# Patient Record
Sex: Male | Born: 1960 | Race: White | Hispanic: No | Smoking: Former smoker
Health system: Southern US, Community
[De-identification: ages and names within clinical notes are randomized; demographics above are authoritative.]

## PROBLEM LIST (undated history)

## (undated) DIAGNOSIS — Z7901 Long term (current) use of anticoagulants: Secondary | ICD-10-CM

## (undated) DIAGNOSIS — M869 Osteomyelitis, unspecified: Secondary | ICD-10-CM

## (undated) DIAGNOSIS — G8929 Other chronic pain: Secondary | ICD-10-CM

## (undated) DIAGNOSIS — E119 Type 2 diabetes mellitus without complications: Secondary | ICD-10-CM

## (undated) DIAGNOSIS — G8928 Other chronic postprocedural pain: Secondary | ICD-10-CM

## (undated) DIAGNOSIS — I10 Essential (primary) hypertension: Secondary | ICD-10-CM

## (undated) DIAGNOSIS — E559 Vitamin D deficiency, unspecified: Secondary | ICD-10-CM

## (undated) DIAGNOSIS — R05 Cough: Secondary | ICD-10-CM

## (undated) DIAGNOSIS — E1140 Type 2 diabetes mellitus with diabetic neuropathy, unspecified: Secondary | ICD-10-CM

## (undated) DIAGNOSIS — K648 Other hemorrhoids: Secondary | ICD-10-CM

## (undated) DIAGNOSIS — C801 Malignant (primary) neoplasm, unspecified: Secondary | ICD-10-CM

## (undated) DIAGNOSIS — I509 Heart failure, unspecified: Secondary | ICD-10-CM

## (undated) DIAGNOSIS — D649 Anemia, unspecified: Secondary | ICD-10-CM

## (undated) DIAGNOSIS — M1990 Unspecified osteoarthritis, unspecified site: Secondary | ICD-10-CM

## (undated) DIAGNOSIS — M545 Low back pain, unspecified: Secondary | ICD-10-CM

## (undated) DIAGNOSIS — I219 Acute myocardial infarction, unspecified: Secondary | ICD-10-CM

## (undated) DIAGNOSIS — K219 Gastro-esophageal reflux disease without esophagitis: Secondary | ICD-10-CM

## (undated) DIAGNOSIS — Z9289 Personal history of other medical treatment: Secondary | ICD-10-CM

## (undated) DIAGNOSIS — G609 Hereditary and idiopathic neuropathy, unspecified: Secondary | ICD-10-CM

## (undated) DIAGNOSIS — L97509 Non-pressure chronic ulcer of other part of unspecified foot with unspecified severity: Secondary | ICD-10-CM

## (undated) DIAGNOSIS — I951 Orthostatic hypotension: Secondary | ICD-10-CM

## (undated) DIAGNOSIS — E7800 Pure hypercholesterolemia, unspecified: Secondary | ICD-10-CM

## (undated) DIAGNOSIS — E11621 Type 2 diabetes mellitus with foot ulcer: Secondary | ICD-10-CM

## (undated) DIAGNOSIS — G4730 Sleep apnea, unspecified: Secondary | ICD-10-CM

## (undated) DIAGNOSIS — Z992 Dependence on renal dialysis: Secondary | ICD-10-CM

## (undated) DIAGNOSIS — J189 Pneumonia, unspecified organism: Secondary | ICD-10-CM

## (undated) DIAGNOSIS — M353 Polymyalgia rheumatica: Secondary | ICD-10-CM

## (undated) DIAGNOSIS — R059 Cough, unspecified: Secondary | ICD-10-CM

## (undated) DIAGNOSIS — N289 Disorder of kidney and ureter, unspecified: Secondary | ICD-10-CM

## (undated) DIAGNOSIS — M2550 Pain in unspecified joint: Secondary | ICD-10-CM

## (undated) DIAGNOSIS — N186 End stage renal disease: Secondary | ICD-10-CM

## (undated) DIAGNOSIS — M86172 Other acute osteomyelitis, left ankle and foot: Secondary | ICD-10-CM

## (undated) DIAGNOSIS — G4700 Insomnia, unspecified: Secondary | ICD-10-CM

## (undated) DIAGNOSIS — I2510 Atherosclerotic heart disease of native coronary artery without angina pectoris: Secondary | ICD-10-CM

## (undated) DIAGNOSIS — F419 Anxiety disorder, unspecified: Secondary | ICD-10-CM

## (undated) DIAGNOSIS — I639 Cerebral infarction, unspecified: Secondary | ICD-10-CM

## (undated) DIAGNOSIS — I6381 Other cerebral infarction due to occlusion or stenosis of small artery: Secondary | ICD-10-CM

## (undated) HISTORY — PX: UVULOPALATOPHARYNGOPLASTY, TONSILLECTOMY AND SEPTOPLASTY: SHX2632

## (undated) HISTORY — PX: BACK SURGERY: SHX140

## (undated) HISTORY — PX: TONSILLECTOMY: SUR1361

## (undated) HISTORY — DX: Other cerebral infarction due to occlusion or stenosis of small artery: I63.81

## (undated) HISTORY — DX: Long term (current) use of anticoagulants: Z79.01

## (undated) HISTORY — DX: Osteomyelitis, unspecified: M86.9

## (undated) HISTORY — DX: Hereditary and idiopathic neuropathy, unspecified: G60.9

## (undated) HISTORY — DX: Other hemorrhoids: K64.8

## (undated) HISTORY — DX: Anxiety disorder, unspecified: F41.9

## (undated) HISTORY — PX: JOINT REPLACEMENT: SHX530

## (undated) HISTORY — DX: Pain in unspecified joint: M25.50

## (undated) HISTORY — DX: Vitamin D deficiency, unspecified: E55.9

## (undated) HISTORY — DX: Other chronic postprocedural pain: G89.28

## (undated) HISTORY — PX: TOE AMPUTATION: SHX809

## (undated) HISTORY — DX: Anemia, unspecified: D64.9

## (undated) HISTORY — PX: REFRACTIVE SURGERY: SHX103

## (undated) HISTORY — PX: CARDIAC CATHETERIZATION: SHX172

## (undated) HISTORY — DX: Heart failure, unspecified: I50.9

## (undated) HISTORY — PX: CORONARY ARTERY BYPASS GRAFT: SHX141

## (undated) HISTORY — DX: Insomnia, unspecified: G47.00

---

## 1993-01-11 DIAGNOSIS — I219 Acute myocardial infarction, unspecified: Secondary | ICD-10-CM

## 1993-01-11 HISTORY — DX: Acute myocardial infarction, unspecified: I21.9

## 2004-01-12 DIAGNOSIS — I6381 Other cerebral infarction due to occlusion or stenosis of small artery: Secondary | ICD-10-CM

## 2004-01-12 HISTORY — DX: Other cerebral infarction due to occlusion or stenosis of small artery: I63.81

## 2006-01-10 DIAGNOSIS — I639 Cerebral infarction, unspecified: Secondary | ICD-10-CM

## 2006-01-10 HISTORY — DX: Cerebral infarction, unspecified: I63.9

## 2008-01-12 DIAGNOSIS — M2550 Pain in unspecified joint: Secondary | ICD-10-CM

## 2008-01-12 HISTORY — DX: Pain in unspecified joint: M25.50

## 2009-02-17 DIAGNOSIS — M47812 Spondylosis without myelopathy or radiculopathy, cervical region: Secondary | ICD-10-CM | POA: Insufficient documentation

## 2009-07-25 DIAGNOSIS — I509 Heart failure, unspecified: Secondary | ICD-10-CM

## 2009-07-25 HISTORY — DX: Heart failure, unspecified: I50.9

## 2011-02-12 HISTORY — PX: ANTERIOR CERVICAL DECOMP/DISCECTOMY FUSION: SHX1161

## 2011-09-22 ENCOUNTER — Emergency Department (HOSPITAL_COMMUNITY)
Admission: EM | Admit: 2011-09-22 | Discharge: 2011-09-22 | Disposition: A | Discharge status: Home / Self Care | Payer: No Typology Code available for payment source | Attending: Emergency Medicine | Admitting: Emergency Medicine

## 2011-09-22 ENCOUNTER — Emergency Department (HOSPITAL_COMMUNITY): Payer: No Typology Code available for payment source

## 2011-09-22 ENCOUNTER — Encounter (HOSPITAL_COMMUNITY): Payer: Self-pay | Admitting: Emergency Medicine

## 2011-09-22 DIAGNOSIS — S335XXA Sprain of ligaments of lumbar spine, initial encounter: Secondary | ICD-10-CM | POA: Insufficient documentation

## 2011-09-22 DIAGNOSIS — M542 Cervicalgia: Secondary | ICD-10-CM | POA: Insufficient documentation

## 2011-09-22 DIAGNOSIS — Z794 Long term (current) use of insulin: Secondary | ICD-10-CM | POA: Insufficient documentation

## 2011-09-22 DIAGNOSIS — E119 Type 2 diabetes mellitus without complications: Secondary | ICD-10-CM | POA: Insufficient documentation

## 2011-09-22 DIAGNOSIS — S139XXA Sprain of joints and ligaments of unspecified parts of neck, initial encounter: Secondary | ICD-10-CM | POA: Insufficient documentation

## 2011-09-22 DIAGNOSIS — Z79899 Other long term (current) drug therapy: Secondary | ICD-10-CM | POA: Insufficient documentation

## 2011-09-22 DIAGNOSIS — I10 Essential (primary) hypertension: Secondary | ICD-10-CM | POA: Insufficient documentation

## 2011-09-22 DIAGNOSIS — S161XXA Strain of muscle, fascia and tendon at neck level, initial encounter: Secondary | ICD-10-CM

## 2011-09-22 DIAGNOSIS — M353 Polymyalgia rheumatica: Secondary | ICD-10-CM | POA: Insufficient documentation

## 2011-09-22 DIAGNOSIS — W010XXA Fall on same level from slipping, tripping and stumbling without subsequent striking against object, initial encounter: Secondary | ICD-10-CM | POA: Insufficient documentation

## 2011-09-22 DIAGNOSIS — S39012A Strain of muscle, fascia and tendon of lower back, initial encounter: Secondary | ICD-10-CM

## 2011-09-22 DIAGNOSIS — IMO0001 Reserved for inherently not codable concepts without codable children: Secondary | ICD-10-CM | POA: Insufficient documentation

## 2011-09-22 DIAGNOSIS — Y9229 Other specified public building as the place of occurrence of the external cause: Secondary | ICD-10-CM | POA: Insufficient documentation

## 2011-09-22 HISTORY — DX: Polymyalgia rheumatica: M35.3

## 2011-09-22 HISTORY — DX: Essential (primary) hypertension: I10

## 2011-09-22 MED ORDER — KETOROLAC TROMETHAMINE 60 MG/2ML IM SOLN
60.0000 mg | Freq: Once | INTRAMUSCULAR | Status: AC
  Administered 2011-09-22: 60 mg via INTRAMUSCULAR
  Filled 2011-09-22: qty 2

## 2011-09-22 MED ORDER — IBUPROFEN 600 MG PO TABS: 600.0000 mg | ORAL_TABLET | Freq: Four times a day (QID) | ORAL | Status: AC | PRN

## 2011-09-22 MED ORDER — METHOCARBAMOL 500 MG PO TABS
1000.0000 mg | ORAL_TABLET | Freq: Once | ORAL | Status: AC
  Administered 2011-09-22: 1000 mg via ORAL
  Filled 2011-09-22: qty 2

## 2011-09-22 MED ORDER — HYDROCODONE-ACETAMINOPHEN 5-325 MG PO TABS
1.0000 | ORAL_TABLET | Freq: Once | ORAL | Status: AC
  Administered 2011-09-22: 1 via ORAL
  Filled 2011-09-22: qty 1

## 2011-09-22 MED ORDER — METHOCARBAMOL 500 MG PO TABS: 500.0000 mg | ORAL_TABLET | Freq: Four times a day (QID) | ORAL | Status: AC

## 2011-09-22 MED ORDER — OXYCODONE-ACETAMINOPHEN 5-325 MG PO TABS: 1.0000 | ORAL_TABLET | ORAL | Status: AC | PRN

## 2011-09-22 NOTE — ED Notes (Signed)
[id number] Expected date:<BR> Expected time:<BR> Means of arrival:<BR> Comments:<BR> Fall/hip,back pain/lsb

## 2011-09-22 NOTE — ED Provider Notes (Signed)
History     CSN: [id number]  Arrival date & time 09/22/11  1141   First MD Initiated Contact with Patient 09/22/11 1253      Chief Complaint  Patient presents with  . Fall  . Spasms    (Consider location/radiation/quality/duration/timing/severity/associated sxs/prior treatment) HPI Pt had slip in grocery store. States he caught himself and did not strike head, neck or back. Now he c/o posterior cervical tenderness. Pt has had fusion in the past. Also c/o low back pain and R buttock pain. No focal weakness. C/o ongoing tingling in ext.  Past Medical History  Diagnosis Date  . Polymyalgia rheumatica   . Diabetes mellitus   . Hypertension     Past Surgical History  Procedure Date  . Neck fusion     No family history on file.  History  Substance Use Topics  . Smoking status: Not on file  . Smokeless tobacco: Not on file  . Alcohol Use:       Review of Systems  Constitutional: Negative for fever and chills.  HENT: Positive for neck pain. Negative for neck stiffness.   Eyes: Negative for visual disturbance.  Cardiovascular: Negative for chest pain.  Gastrointestinal: Negative for nausea, vomiting, abdominal pain and diarrhea.  Genitourinary: Negative for dysuria, frequency, flank pain and enuresis.  Musculoskeletal: Positive for myalgias and back pain.  Skin: Negative for rash and wound.  Neurological: Negative for dizziness, seizures, syncope, weakness, light-headedness, numbness and headaches.    Allergies  Tygacil  Home Medications   Current Outpatient Rx  Name Route Sig Dispense Refill  . ALPRAZOLAM 0.5 MG PO TABS Oral Take 0.5 mg by mouth 3 (three) times daily as needed. Anxiety / Sleep    . AMITRIPTYLINE HCL 50 MG PO TABS Oral Take 50 mg by mouth at bedtime.    Marland Kitchen AMLODIPINE BESYLATE 2.5 MG PO TABS Oral Take 2.5 mg by mouth daily.    . ATENOLOL 25 MG PO TABS Oral Take 25 mg by mouth daily.    . FUROSEMIDE 20 MG PO TABS Oral Take 20 mg by mouth 2 (two)  times daily.    Marland Kitchen GABAPENTIN 100 MG PO CAPS Oral Take 400 mg by mouth 3 (three) times daily. Take four 100 mg capsules (400 mg total) three times daily    . GLIPIZIDE 10 MG PO TABS Oral Take 10 mg by mouth 2 (two) times daily before a meal.    . INSULIN GLARGINE 100 UNIT/ML [place] SOLN Subcutaneous Inject 32 Units into the skin daily with breakfast.    . LOSARTAN POTASSIUM 25 MG PO TABS Oral Take 25 mg by mouth daily.    . ADULT MULTIVITAMIN W/MINERALS CH Oral Take 1 tablet by mouth daily.    Marland Kitchen PREDNISONE 5 MG PO TABS Oral Take 5 mg by mouth daily.    . IBUPROFEN 600 MG PO TABS Oral Take 1 tablet (600 mg total) by mouth every 6 (six) hours as needed for pain. 30 tablet 0  . METHOCARBAMOL 500 MG PO TABS Oral Take 1 tablet (500 mg total) by mouth 4 (four) times daily. 20 tablet 0  . OXYCODONE-ACETAMINOPHEN 5-325 MG PO TABS Oral Take 1 tablet by mouth every 4 (four) hours as needed for pain. 15 tablet 0    BP 158/106  Pulse 99  Temp 98.3 F (36.8 C) (Oral)  Resp 20  SpO2 100%  Physical Exam  Nursing note and vitals reviewed. Constitutional: He is oriented to person, place, and time. He  appears well-developed and well-nourished. No distress.  HENT:  Head: Normocephalic and atraumatic.  Mouth/Throat: Oropharynx is clear and moist.  Eyes: EOM are normal. Pupils are equal, round, and reactive to light.  Neck:       Pt has generalized posterior cervical TTP without focality  Cardiovascular: Normal rate and regular rhythm.   Pulmonary/Chest: Effort normal and breath sounds normal. No respiratory distress. He has no wheezes. He has no rales.  Abdominal: Soft. Bowel sounds are normal. He exhibits no mass. There is no tenderness. There is no rebound and no guarding.  Musculoskeletal: Normal range of motion. He exhibits tenderness (TTP over mid line lumbar and R paraspinal tenderness, TTP over R gluteal muscles. FROM R hip). He exhibits no edema.  Neurological: He is alert and oriented to person,  place, and time.       5/5 motor in all extremities, sensation grossly intact.   Skin: Skin is warm and dry. No rash noted. No erythema.  Psychiatric: He has a normal mood and affect. His behavior is normal.    ED Course  Procedures (including critical care time)  Labs Reviewed - No data to display Dg Lumbar Spine Complete  09/22/2011  *RADIOLOGY REPORT*  Clinical Data: History of fall complaining of back pain.  LUMBAR SPINE - COMPLETE 4+ VIEW  Comparison: No priors.  Findings: Multiple views of the lumbar spine demonstrate no definite acute displaced fracture or compression type fracture. There is mild multilevel degenerative disc disease, most severe at T10-T11 and T11-T12.  Mild multilevel facet arthropathy is also noted, most severe L4 - L5 and L5 - S1.  No pars defects are noted.  IMPRESSION: 1.  No acute radiographic abnormality of the lumbar spine. 2.  Mild multilevel degenerative disc disease and lumbar spondylosis, as above.   Original Report Authenticated By: Etheleen Mayhew, M.D.    Dg Hip Complete Right  09/22/2011  *RADIOLOGY REPORT*  Clinical Data: History of fall complaining of right-sided hip pain.  RIGHT HIP - COMPLETE 2+ VIEW  Comparison: No priors.  Findings: AP view of the pelvis and AP and lateral views of the right hip demonstrate no acute displaced fracture, subluxation, dislocation, joint or soft tissue abnormality.  Degenerative changes of osteoarthritis are noted the hip joints bilaterally (mild). Multiple pelvic phleboliths are noted bilaterally.  IMPRESSION: 1.  No acute radiographic abnormality of the bony pelvis or the right hip. 2.  Mild bilateral hip joint osteoarthritis.   Original Report Authenticated By: Etheleen Mayhew, M.D.    Ct Cervical Spine Wo Contrast  09/22/2011  *RADIOLOGY REPORT*  Clinical Data: History of fall.  Posterior neck pain.  CT CERVICAL SPINE WITHOUT CONTRAST  Technique:  Multidetector CT imaging of the cervical spine was performed.  Multiplanar CT image reconstructions were also generated.  Comparison: MRI of the cervical spine 09/29/2010.  Findings: Postoperative changes of the ACDF are noted from C4-C7. At these levels, there is an anterior plate and screw fixation device in place, and there are interbody grafts in place at the C4- C5, C5-C6 and C6-C7 interspaces.  Alignment is anatomic.  No acute displaced fractures of the cervical spine are appreciated. Prevertebral soft tissues are normal.  Visualized portions of the upper thorax are unremarkable.  IMPRESSION: 1.  No evidence of significant acute traumatic injury to the cervical spine. 2.  Postoperative changes of ACDF from C4-C7, as above, without acute complicating features.   Original Report Authenticated By: Etheleen Mayhew, M.D.  1. Cervical strain   2. Lumbar strain       MDM   Pt ambulating without difficulty.        Julianne Rice, MD 09/22/11 [phone]

## 2011-09-22 NOTE — ED Notes (Signed)
Per EMS: Pt was at [employer] and was about to eat lunch with his fiance and slipped on something slippery on the floor.  Pt went to catch himself and twisted his back (says he heard a pop).  Knocked over an olive oil display.  Pt now c/o back, hip, neck, knee pain, back spasms, and numbness and tingling to rt arm.  Denies LOC or hitting head.  Hx polymyalgia rheumatic but numbness and tingling is much worse now than it normally is.

## 2011-09-22 NOTE — ED Notes (Signed)
Patient transported to CT 

## 2011-10-12 DIAGNOSIS — K648 Other hemorrhoids: Secondary | ICD-10-CM

## 2011-10-12 DIAGNOSIS — B3781 Candidal esophagitis: Secondary | ICD-10-CM | POA: Insufficient documentation

## 2011-10-12 HISTORY — DX: Other hemorrhoids: K64.8

## 2011-12-24 ENCOUNTER — Inpatient Hospital Stay
Admission: AD | Admit: 2011-12-24 | Discharge: 2012-01-27 | Disposition: A | Discharge status: Skilled Nursing Facility | Payer: Medicare Other | Source: Ambulatory Visit | Attending: Internal Medicine | Admitting: Internal Medicine

## 2011-12-24 DIAGNOSIS — M869 Osteomyelitis, unspecified: Secondary | ICD-10-CM

## 2011-12-24 HISTORY — DX: Gastro-esophageal reflux disease without esophagitis: K21.9

## 2011-12-24 HISTORY — DX: Other acute osteomyelitis, left ankle and foot: M86.172

## 2011-12-24 HISTORY — DX: Type 2 diabetes mellitus with diabetic neuropathy, unspecified: E11.40

## 2011-12-25 ENCOUNTER — Other Ambulatory Visit (HOSPITAL_COMMUNITY): Payer: Medicare Other

## 2011-12-25 LAB — CBC WITH DIFFERENTIAL/PLATELET
Eosinophils Relative: 3 % (ref 0–5)
HCT: 28.8 % — ABNORMAL LOW (ref 39.0–52.0)
Hemoglobin: 9.5 g/dL — ABNORMAL LOW (ref 13.0–17.0)
Lymphocytes Relative: 29 % (ref 12–46)
Lymphs Abs: 3.5 10*3/uL (ref 0.7–4.0)
MCV: 84.2 fL (ref 78.0–100.0)
Monocytes Absolute: 0.8 10*3/uL (ref 0.1–1.0)
Monocytes Relative: 7 % (ref 3–12)
Neutro Abs: 7.3 10*3/uL (ref 1.7–7.7)
WBC: 12 10*3/uL — ABNORMAL HIGH (ref 4.0–10.5)

## 2011-12-25 LAB — APTT: aPTT: 32 seconds (ref 24–37)

## 2011-12-25 LAB — COMPREHENSIVE METABOLIC PANEL
AST: 20 U/L (ref 0–37)
BUN: 42 mg/dL — ABNORMAL HIGH (ref 6–23)
CO2: 20 mEq/L (ref 19–32)
Calcium: 8.5 mg/dL (ref 8.4–10.5)
Chloride: 104 mEq/L (ref 96–112)
Creatinine, Ser: 1.98 mg/dL — ABNORMAL HIGH (ref 0.50–1.35)
GFR calc Af Amer: 43 mL/min — ABNORMAL LOW (ref >90)
GFR calc non Af Amer: 37 mL/min — ABNORMAL LOW (ref >90)
Glucose, Bld: 170 mg/dL — ABNORMAL HIGH (ref 70–99)
Total Bilirubin: 0.1 mg/dL — ABNORMAL LOW (ref 0.3–1.2)

## 2011-12-25 LAB — HEMOGLOBIN A1C: Hgb A1c MFr Bld: 9.6 % — ABNORMAL HIGH (ref <5.7)

## 2011-12-26 LAB — BASIC METABOLIC PANEL
Chloride: 110 mEq/L (ref 96–112)
GFR calc Af Amer: 45 mL/min — ABNORMAL LOW (ref >90)
GFR calc non Af Amer: 39 mL/min — ABNORMAL LOW (ref >90)
Glucose, Bld: 106 mg/dL — ABNORMAL HIGH (ref 70–99)
Potassium: 4.1 mEq/L (ref 3.5–5.1)
Sodium: 139 mEq/L (ref 135–145)

## 2011-12-28 DIAGNOSIS — M79609 Pain in unspecified limb: Secondary | ICD-10-CM

## 2011-12-28 DIAGNOSIS — M7989 Other specified soft tissue disorders: Secondary | ICD-10-CM

## 2011-12-28 NOTE — Progress Notes (Signed)
VASCULAR LAB PRELIMINARY  PRELIMINARY  PRELIMINARY  PRELIMINARY  Left lower extremity venous duplex completed.    Preliminary report:  Left:  No evidence of DVT, superficial thrombosis, or Baker's cyst.  Donnetta Gillin, RVT 12/28/2011, 2:27 PM

## 2011-12-30 LAB — CBC
Hemoglobin: 8.4 g/dL — ABNORMAL LOW (ref 13.0–17.0)
MCV: 84.8 fL (ref 78.0–100.0)
Platelets: 277 10*3/uL (ref 150–400)
RBC: 3.1 MIL/uL — ABNORMAL LOW (ref 4.22–5.81)
WBC: 9.5 10*3/uL (ref 4.0–10.5)

## 2011-12-30 LAB — BASIC METABOLIC PANEL
CO2: 22 mEq/L (ref 19–32)
Chloride: 106 mEq/L (ref 96–112)
Sodium: 135 mEq/L (ref 135–145)

## 2011-12-31 LAB — BASIC METABOLIC PANEL
CO2: 22 mEq/L (ref 19–32)
Glucose, Bld: 102 mg/dL — ABNORMAL HIGH (ref 70–99)
Potassium: 4.5 mEq/L (ref 3.5–5.1)
Sodium: 136 mEq/L (ref 135–145)

## 2012-01-01 LAB — BASIC METABOLIC PANEL
BUN: 58 mg/dL — ABNORMAL HIGH (ref 6–23)
CO2: 22 mEq/L (ref 19–32)
Chloride: 105 mEq/L (ref 96–112)
Glucose, Bld: 109 mg/dL — ABNORMAL HIGH (ref 70–99)
Potassium: 4.6 mEq/L (ref 3.5–5.1)

## 2012-01-02 LAB — CBC
Hemoglobin: 8.2 g/dL — ABNORMAL LOW (ref 13.0–17.0)
MCH: 27.4 pg (ref 26.0–34.0)
MCV: 86 fL (ref 78.0–100.0)
RBC: 2.99 MIL/uL — ABNORMAL LOW (ref 4.22–5.81)

## 2012-01-02 LAB — BASIC METABOLIC PANEL
CO2: 22 mEq/L (ref 19–32)
Calcium: 9 mg/dL (ref 8.4–10.5)
Creatinine, Ser: 1.94 mg/dL — ABNORMAL HIGH (ref 0.50–1.35)
Glucose, Bld: 98 mg/dL (ref 70–99)
Sodium: 134 mEq/L — ABNORMAL LOW (ref 135–145)

## 2012-01-03 LAB — URINALYSIS, ROUTINE W REFLEX MICROSCOPIC
Nitrite: NEGATIVE
Specific Gravity, Urine: 1.021 (ref 1.005–1.030)
Urobilinogen, UA: 0.2 mg/dL (ref 0.0–1.0)
pH: 6 (ref 5.0–8.0)

## 2012-01-03 LAB — URINE MICROSCOPIC-ADD ON

## 2012-01-04 LAB — BASIC METABOLIC PANEL
Calcium: 8.6 mg/dL (ref 8.4–10.5)
GFR calc Af Amer: 48 mL/min — ABNORMAL LOW (ref >90)
GFR calc non Af Amer: 41 mL/min — ABNORMAL LOW (ref >90)
Glucose, Bld: 88 mg/dL (ref 70–99)
Potassium: 4.5 mEq/L (ref 3.5–5.1)
Sodium: 138 mEq/L (ref 135–145)

## 2012-01-05 LAB — VANCOMYCIN, TROUGH: Vancomycin Tr: 18.2 ug/mL (ref 10.0–20.0)

## 2012-01-06 LAB — BASIC METABOLIC PANEL
Calcium: 8.8 mg/dL (ref 8.4–10.5)
GFR calc Af Amer: 42 mL/min — ABNORMAL LOW (ref >90)
GFR calc non Af Amer: 37 mL/min — ABNORMAL LOW (ref >90)
Glucose, Bld: 71 mg/dL (ref 70–99)
Sodium: 134 mEq/L — ABNORMAL LOW (ref 135–145)

## 2012-01-06 LAB — URINE CULTURE: Special Requests: NORMAL

## 2012-01-07 LAB — BASIC METABOLIC PANEL
BUN: 68 mg/dL — ABNORMAL HIGH (ref 6–23)
CO2: 21 mEq/L (ref 19–32)
Calcium: 8.7 mg/dL (ref 8.4–10.5)
Chloride: 107 mEq/L (ref 96–112)
Creatinine, Ser: 1.82 mg/dL — ABNORMAL HIGH (ref 0.50–1.35)
GFR calc Af Amer: 48 mL/min — ABNORMAL LOW (ref >90)
GFR calc non Af Amer: 41 mL/min — ABNORMAL LOW (ref >90)
Glucose, Bld: 101 mg/dL — ABNORMAL HIGH (ref 70–99)
Potassium: 4.7 mEq/L (ref 3.5–5.1)
Sodium: 136 mEq/L (ref 135–145)

## 2012-01-07 LAB — CBC
MCH: 28 pg (ref 26.0–34.0)
Platelets: 253 10*3/uL (ref 150–400)
RBC: 2.71 MIL/uL — ABNORMAL LOW (ref 4.22–5.81)

## 2012-01-07 LAB — C-REACTIVE PROTEIN: CRP: 1.5 mg/dL — ABNORMAL HIGH (ref <0.60)

## 2012-01-07 LAB — PREPARE RBC (CROSSMATCH)

## 2012-01-07 LAB — SEDIMENTATION RATE: Sed Rate: 136 mm/hr — ABNORMAL HIGH (ref 0–16)

## 2012-01-08 LAB — ABO/RH: ABO/RH(D): O NEG

## 2012-01-09 LAB — CBC WITH DIFFERENTIAL/PLATELET
Eosinophils Absolute: 0.4 10*3/uL (ref 0.0–0.7)
Lymphocytes Relative: 42 % (ref 12–46)
Lymphs Abs: 3 10*3/uL (ref 0.7–4.0)
Neutro Abs: 2.9 10*3/uL (ref 1.7–7.7)
Neutrophils Relative %: 41 % — ABNORMAL LOW (ref 43–77)
Platelets: 246 10*3/uL (ref 150–400)
RBC: 3.04 MIL/uL — ABNORMAL LOW (ref 4.22–5.81)
WBC: 7.2 10*3/uL (ref 4.0–10.5)

## 2012-01-09 LAB — TYPE AND SCREEN: ABO/RH(D): O NEG

## 2012-01-10 LAB — BASIC METABOLIC PANEL
CO2: 23 mEq/L (ref 19–32)
Calcium: 9.3 mg/dL (ref 8.4–10.5)
GFR calc Af Amer: 48 mL/min — ABNORMAL LOW (ref >90)
Glucose, Bld: 93 mg/dL (ref 70–99)
Potassium: 5.6 mEq/L — ABNORMAL HIGH (ref 3.5–5.1)
Sodium: 133 mEq/L — ABNORMAL LOW (ref 135–145)

## 2012-01-10 LAB — C-REACTIVE PROTEIN: CRP: 1.4 mg/dL — ABNORMAL HIGH (ref <0.60)

## 2012-01-11 DIAGNOSIS — M7989 Other specified soft tissue disorders: Secondary | ICD-10-CM

## 2012-01-11 LAB — BASIC METABOLIC PANEL
BUN: 55 mg/dL — ABNORMAL HIGH (ref 6–23)
CO2: 22 mEq/L (ref 19–32)
Calcium: 8.9 mg/dL (ref 8.4–10.5)
GFR calc non Af Amer: 42 mL/min — ABNORMAL LOW (ref >90)
Glucose, Bld: 143 mg/dL — ABNORMAL HIGH (ref 70–99)
Potassium: 5.1 mEq/L (ref 3.5–5.1)
Sodium: 134 mEq/L — ABNORMAL LOW (ref 135–145)

## 2012-01-11 NOTE — Progress Notes (Signed)
*  PRELIMINARY RESULTS* Vascular Ultrasound Left lower extremity venous duplex has been completed.  Preliminary findings: Left:  No evidence of DVT, superficial thrombosis, or Baker's cyst.   Landry Mellow, RDMS, RVT 01/11/2012, 3:38 PM

## 2012-01-13 LAB — CBC
MCH: 27.7 pg (ref 26.0–34.0)
MCV: 87.2 fL (ref 78.0–100.0)
Platelets: 249 10*3/uL (ref 150–400)
RBC: 3.29 MIL/uL — ABNORMAL LOW (ref 4.22–5.81)
RDW: 14.3 % (ref 11.5–15.5)
WBC: 6.6 10*3/uL (ref 4.0–10.5)

## 2012-01-13 LAB — BASIC METABOLIC PANEL
CO2: 23 mEq/L (ref 19–32)
Calcium: 9 mg/dL (ref 8.4–10.5)
Creatinine, Ser: 2.18 mg/dL — ABNORMAL HIGH (ref 0.50–1.35)
GFR calc Af Amer: 38 mL/min — ABNORMAL LOW (ref >90)
Sodium: 133 mEq/L — ABNORMAL LOW (ref 135–145)

## 2012-01-14 ENCOUNTER — Encounter: Payer: Self-pay | Admitting: Infectious Diseases

## 2012-01-14 ENCOUNTER — Other Ambulatory Visit (HOSPITAL_COMMUNITY): Payer: Medicare Other

## 2012-01-14 DIAGNOSIS — R609 Edema, unspecified: Secondary | ICD-10-CM

## 2012-01-14 DIAGNOSIS — E119 Type 2 diabetes mellitus without complications: Secondary | ICD-10-CM

## 2012-01-14 LAB — BASIC METABOLIC PANEL
Calcium: 9.1 mg/dL (ref 8.4–10.5)
GFR calc Af Amer: 35 mL/min — ABNORMAL LOW (ref >90)
GFR calc non Af Amer: 30 mL/min — ABNORMAL LOW (ref >90)
Potassium: 4.9 mEq/L (ref 3.5–5.1)
Sodium: 135 mEq/L (ref 135–145)

## 2012-01-14 LAB — CK: Total CK: 158 U/L (ref 7–232)

## 2012-01-14 NOTE — Consult Note (Addendum)
INFECTIOUS DISEASE CONSULT NOTE  Date of Admission:  12/24/2011  Date of Consult:  01/14/2012  Reason for Consult:Osteomyelitis Referring Physician:   Impression/Recommendation Osteomyelitis of L foot   (vancomycin day 31) CRI, worsening Diabetes LE edema  Would- Check MRI of his L foot (without contrast) Follow his Cr, renal is evaluating Will change his vanco to daptomycin Consider dopplers of LE?  Comment- he is concerned that the infection has not cleared from his left foot. I concur with this as there is still erythema and some question of fluctuance at his wound site. An MRI without contrast (to preserve his renal function) would be the next test to order to evaluate this. Infectious disease will be available for questions over the weekend.   Thank you so much for this interesting consult,   [name] [id number]  Matthew Kline is an 52 y.o. male.  HPI: 52 year old male with a history of diabetes type 2 for the last 20 years as well as chronic kidney disease (creatinine of 1.98 on admission 12/24/2011). He underwent resection of his left 4th toe on November 1 for a diabetic foot ulcer. After being discharged at home he noticed a soft place on the wound bed. He began to notice purulent discharge from the wound. [facility] on December 10. He underwent the removal of bone as well as irrigation and debridement of his left foot on December 11. Pathology of the bone showed acute osteomyelitis. Culture of the material grew methicillin-resistant staph aureus sensitive to Bactrim and tetracycline. [facility] with a plan for him to be treated with vancomycin intravenously for the next 6 weeks (end date 02/01/2012. [facility] his  CRP was 1.9.  Today he complains of swelling and pain in his left lower extremity he also has swelling in his right lower extremity. The swelling  extends to his groin. He denies any discharge from his wound bed. Review of his hospital chart shows no fevers documented since his admission.  Past Medical History  Diagnosis Date  . Polymyalgia rheumatica   . Diabetes mellitus   . Hypertension     Past Surgical History  Procedure Date  . Neck fusion      Allergies  Allergen Reactions  . Tygacil (Tigecycline) Nausea And Vomiting    Medications: Amitriptyline, Norvasc, aspirin, fluconazole (12-27 to 01-13-12), fluticasone, Lasix, Neurontin, subcutaneous heparin, Lantus, Reglan, metoprolol, nicotine, OxyContin, Protonix, Carafate, vancomycin 750 mg intravenous every 24 hours, vitamin D.  Social History:  does not have a smoking history on file. He does not have any smokeless tobacco history on file. His alcohol and drug histories not on file.  No family history on file.  General ROS: normal vision, no difficulty with PIC, no dysuria, normal BM, eating well, no light touch LE. see HPI.   There were no vitals taken for this visit. 98.4    83    16/94%/RA     131/80     233.4# General appearance: alert, cooperative and no distress Eyes: negative findings: pupils equal, round, reactive to light and accomodation Throat: normal findings: oropharynx pink & moist without lesions or evidence of thrush and abnormal findings: poor dentitino Neck: no adenopathy and supple, symmetrical, trachea midline Lungs: clear to auscultation bilaterally Heart: regular rate and rhythm Abdomen: normal findings: bowel sounds normal and soft, non-tender Extremities: edema massive LE, tender, tender.  and .5 x 1 cm wound on his  L 4th MT head. erythema surrounding this onto his foot. no d/c expressable. fluctuance+.    Results for orders placed during the hospital encounter of 12/24/11 (from the past 48 hour(s))  VANCOMYCIN, TROUGH     Status: Abnormal   Collection Time   01/12/12  9:00 PM      Component Value Range Comment   Vancomycin Tr 23.9 (*) 10.0 -  20.0 ug/mL   CBC     Status: Abnormal   Collection Time   01/13/12  5:40 AM      Component Value Range Comment   WBC 6.6  4.0 - 10.5 K/uL    RBC 3.29 (*) 4.22 - 5.81 MIL/uL    Hemoglobin 9.1 (*) 13.0 - 17.0 g/dL    HCT 28.7 (*) 39.0 - 52.0 %    MCV 87.2  78.0 - 100.0 fL    MCH 27.7  26.0 - 34.0 pg    MCHC 31.7  30.0 - 36.0 g/dL    RDW 14.3  11.5 - 15.5 %    Platelets 249  150 - 400 K/uL   BASIC METABOLIC PANEL     Status: Abnormal   Collection Time   01/13/12  5:40 AM      Component Value Range Comment   Sodium 133 (*) 135 - 145 mEq/L    Potassium 5.3 (*) 3.5 - 5.1 mEq/L    Chloride 103  96 - 112 mEq/L    CO2 23  19 - 32 mEq/L    Glucose, Bld 146 (*) 70 - 99 mg/dL    BUN 59 (*) 6 - 23 mg/dL    Creatinine, Ser 2.18 (*) 0.50 - 1.35 mg/dL    Calcium 9.0  8.4 - 10.5 mg/dL    GFR calc non Af Amer 33 (*) >90 mL/min    GFR calc Af Amer 38 (*) >90 mL/min   BASIC METABOLIC PANEL     Status: Abnormal   Collection Time   01/14/12  5:30 AM      Component Value Range Comment   Sodium 135  135 - 145 mEq/L    Potassium 4.9  3.5 - 5.1 mEq/L    Chloride 104  96 - 112 mEq/L    CO2 23  19 - 32 mEq/L    Glucose, Bld 135 (*) 70 - 99 mg/dL    BUN 65 (*) 6 - 23 mg/dL    Creatinine, Ser 2.33 (*) 0.50 - 1.35 mg/dL    Calcium 9.1  8.4 - 10.5 mg/dL    GFR calc non Af Amer 30 (*) >90 mL/min    GFR calc Af Amer 35 (*) >90 mL/min   CK     Status: Normal   Collection Time   01/14/12  5:30 AM      Component Value Range Comment   Total CK 158  7 - 232 U/L       Component Value Date/Time   SDES URINE, RANDOM 01/03/2012 1335   SPECREQUEST Normal 01/03/2012 1335   CULT YEAST 01/03/2012 1335   REPTSTATUS 01/06/2012 FINAL 01/03/2012 1335   No results found. No results found for this or any previous visit (from the past 240 hour(s)).    01/14/2012, 4:10 PM     LOS: 21 days

## 2012-01-15 LAB — BASIC METABOLIC PANEL
CO2: 23 mEq/L (ref 19–32)
Chloride: 106 mEq/L (ref 96–112)
Creatinine, Ser: 2.31 mg/dL — ABNORMAL HIGH (ref 0.50–1.35)
GFR calc Af Amer: 36 mL/min — ABNORMAL LOW (ref >90)
Potassium: 5.3 mEq/L — ABNORMAL HIGH (ref 3.5–5.1)
Sodium: 135 mEq/L (ref 135–145)

## 2012-01-15 LAB — CBC
Platelets: 209 10*3/uL (ref 150–400)
RBC: 3.11 MIL/uL — ABNORMAL LOW (ref 4.22–5.81)
RDW: 14.3 % (ref 11.5–15.5)
WBC: 8.1 10*3/uL (ref 4.0–10.5)

## 2012-01-16 ENCOUNTER — Other Ambulatory Visit (HOSPITAL_COMMUNITY): Payer: Medicare Other

## 2012-01-16 LAB — VANCOMYCIN, TROUGH: Vancomycin Tr: 8.7 ug/mL — ABNORMAL LOW (ref 10.0–20.0)

## 2012-01-16 LAB — BASIC METABOLIC PANEL
CO2: 23 mEq/L (ref 19–32)
Calcium: 9.2 mg/dL (ref 8.4–10.5)
Potassium: 5.3 mEq/L — ABNORMAL HIGH (ref 3.5–5.1)
Sodium: 138 mEq/L (ref 135–145)

## 2012-01-17 LAB — BASIC METABOLIC PANEL
Chloride: 107 mEq/L (ref 96–112)
GFR calc Af Amer: 40 mL/min — ABNORMAL LOW (ref >90)
GFR calc non Af Amer: 35 mL/min — ABNORMAL LOW (ref >90)
Glucose, Bld: 145 mg/dL — ABNORMAL HIGH (ref 70–99)
Potassium: 5 mEq/L (ref 3.5–5.1)
Sodium: 137 mEq/L (ref 135–145)

## 2012-01-17 NOTE — Progress Notes (Signed)
INFECTIOUS DISEASE PROGRESS NOTE  ID: Matthew Kline is a 52 y.o. male with   Active Problems:  * No active hospital problems. *   Subjective: Without complaints. Has had some d/c from his wound.   Abtx:  Anti-infectives    None      Medications: Scheduled:    Objective: Vital signs in last 24 hours:     General appearance: alert, cooperative and no distress Extremities: L Kline 4th toe stump has no d/c expressable. non-tender. there is fluctuance.   Lab Results  Basename 01/17/12 0600 01/16/12 1300 01/15/12 0500  WBC -- -- 8.1  HGB -- -- 8.7*  HCT -- -- 26.9*  NA 137 138 --  K 5.0 5.3* --  CL 107 108 --  CO2 23 23 --  BUN 65* 66* --  CREATININE 2.10* 2.04* --  GLU -- -- --   Liver Panel No results found for this basename: PROT:2,ALBUMIN:2,AST:2,ALT:2,ALKPHOS:2,BILITOT:2,BILIDIR:2,IBILI:2 in the last 72 hours Sedimentation Rate No results found for this basename: ESRSEDRATE in the last 72 hours C-Reactive Protein No results found for this basename: CRP:2 in the last 72 hours  Microbiology: No results found for this or any previous visit (from the past 240 hour(s)).  Studies/Results: Matthew Kline Left Wo Contrast  01/16/2012  *RADIOLOGY REPORT*  Clinical Data: Nonhealing amputation site.  Osteomyelitis.  MRI OF THE LEFT FOREFOOT WITHOUT CONTRAST  Technique:  Multiplanar, multisequence Matthew imaging was performed. No intravenous contrast was administered.  Comparison: None.  Findings: Despite efforts by the patient and technologist, motion artifact is present on some series of today's examination and could not be totally eliminated.  This reduces diagnostic sensitivity and specificity.  Amputation of the fourth digit at the level of the distal metatarsal shaft observed.  There is abnormal edema tracking throughout the marrow of the fourth metatarsal, with ill definition of the cortex distally along the shaft, greater than would be expected simply in the setting of  amputation - the appearance is compatible with osteomyelitis.  Ill-defined low signal intensity tracking around the shaft of the fourth metatarsal probably represents immature bone deposition.  I do not observe definite gas tracking in the soft tissues.  There appears to be a small fluid collection along the distal margin of the amputation site on image 18 of series 3, probably tracking to the volar - anterior skin surface on image 17.  Remaining phalanges appear unremarkable.  Low-level edema in the middle and lateral cuneiforms and in the bases of the second and third metatarsals favors degenerative/reactive findings over active osteomyelitis.  Extensive regional soft tissue swelling noted with diffuse subcutaneous edema especially dorsally, and with diffuse edema tracking along the plantar musculature of the Kline.  IMPRESSION:  1.  Abnormal enhancement throughout abnormal enhancement throughout the remaining fourth metatarsal compatible with active osteomyelitis.  Small fluid collection along the distal amputation margin of the bone appears to probably drain to the dorsal Kline surface via a sinus tract. 2.  Very faint edema at the second and third digit Lisfranc joint, probably reactive rather than due to osteomyelitis. 3.  Diffuse regional soft tissue edema favoring cellulitis and myositis.   Original Report Authenticated By: Van Clines, M.D.      Assessment/Plan: Osteomyelitis of L Kline  (antibiotics day 34 vanco--> cubicin)  CRI, worsening (now steady at ~2) Diabetes  LE edema   Surgery/ortho to f/u pt.  His MRI result is noted.  Will check CK while on dapto    [name]  Infectious Diseases [id number] 01/17/2012, 5:03 PM   LOS: 24 days

## 2012-01-18 LAB — CBC
HCT: 28.9 % — ABNORMAL LOW (ref 39.0–52.0)
Hemoglobin: 9.2 g/dL — ABNORMAL LOW (ref 13.0–17.0)
MCHC: 31.8 g/dL (ref 30.0–36.0)

## 2012-01-18 LAB — PROTEIN ELECTROPHORESIS, SERUM
Albumin ELP: 36.1 % — ABNORMAL LOW (ref 55.8–66.1)
Alpha-1-Globulin: 5.6 % — ABNORMAL HIGH (ref 2.9–4.9)
Alpha-2-Globulin: 16.2 % — ABNORMAL HIGH (ref 7.1–11.8)
Beta Globulin: 7.6 % — ABNORMAL HIGH (ref 4.7–7.2)
M-Spike, %: NOT DETECTED g/dL
Total Protein ELP: 6.7 g/dL (ref 6.0–8.3)

## 2012-01-18 LAB — BASIC METABOLIC PANEL
BUN: 64 mg/dL — ABNORMAL HIGH (ref 6–23)
CO2: 24 mEq/L (ref 19–32)
GFR calc non Af Amer: 37 mL/min — ABNORMAL LOW (ref >90)
Glucose, Bld: 152 mg/dL — ABNORMAL HIGH (ref 70–99)
Potassium: 4.9 mEq/L (ref 3.5–5.1)

## 2012-01-18 LAB — CK: Total CK: 167 U/L (ref 7–232)

## 2012-01-20 MED ORDER — CEFAZOLIN SODIUM-DEXTROSE 2-3 GM-% IV SOLR
2.0000 g | INTRAVENOUS | Status: AC
  Administered 2012-01-21: 2 g via INTRAVENOUS

## 2012-01-20 NOTE — H&P (Signed)
Matthew Kline is an 52 y.o. male.   Chief Complaint: Osteomyelitis abscess left foot fourth metatarsal HPI: Patient is a 52 year old gentleman who is status post multiple surgical interventions for infection from both feet. Patient presents at this time with recurrent abscess and osteomyelitis of the left foot fourth metatarsal.  Past Medical History  Diagnosis Date  . Polymyalgia rheumatica   . Diabetes mellitus   . Hypertension   . Hyperlipidemia   . Diabetic neuropathy   . Osteomyelitis of foot, left, acute   . GERD (gastroesophageal reflux disease)     Past Surgical History  Procedure Date  . Neck fusion     Family History  Problem Relation Age of Onset  . Hypertension Mother    Social History:  reports that he quit smoking about 4 weeks ago. He does not have any smokeless tobacco history on file. He reports that he does not drink alcohol or use illicit drugs.  Allergies:  Allergies  Allergen Reactions  . Tygacil (Tigecycline) Nausea And Vomiting    Medications Prior to Admission  Medication Sig Dispense Refill  . ALPRAZolam (XANAX) 0.5 MG tablet Take 0.5 mg by mouth 3 (three) times daily as needed. Anxiety / Sleep      . amitriptyline (ELAVIL) 50 MG tablet Take 50 mg by mouth at bedtime.      Marland Kitchen amLODipine (NORVASC) 2.5 MG tablet Take 2.5 mg by mouth daily.      Marland Kitchen atenolol (TENORMIN) 25 MG tablet Take 25 mg by mouth daily.      . furosemide (LASIX) 20 MG tablet Take 20 mg by mouth 2 (two) times daily.      Marland Kitchen gabapentin (NEURONTIN) 100 MG capsule Take 400 mg by mouth 3 (three) times daily. Take four 100 mg capsules (400 mg total) three times daily      . glipiZIDE (GLUCOTROL) 10 MG tablet Take 10 mg by mouth 2 (two) times daily before a meal.      . insulin glargine (LANTUS) 100 UNIT/ML injection Inject 32 Units into the skin daily with breakfast.      . losartan (COZAAR) 25 MG tablet Take 25 mg by mouth daily.      . Multiple Vitamin (MULTIVITAMIN WITH MINERALS)  TABS Take 1 tablet by mouth daily.      . predniSONE (DELTASONE) 5 MG tablet Take 5 mg by mouth daily.        No results found for this or any previous visit (from the past 48 hour(s)). No results found.  Review of Systems  All other systems reviewed and are negative.    There were no vitals taken for this visit. Physical Exam  On examination patient does have palpable pulses. He has a fluctuant abscess dorsally over the fourth metatarsal. The MRI scan is consistent with abscess and osteomyelitis.  Assessment/Plan Assessment: Abscess osteomyelitis left foot fourth metatarsal.  Plan: Will plan for fourth ray resection left foot debridement of the abscess and placement of antibiotic beads. Risks and benefits were discussed including persistent infection neurovascular injury nonhealing of the wound need for additional surgery. Patient states he understands and wished to proceed at this time.  DUDA,MARCUS V 01/20/2012, 6:30 AM

## 2012-01-21 ENCOUNTER — Encounter
Admission: AD | Disposition: A | Discharge status: Skilled Nursing Facility | Payer: Self-pay | Source: Ambulatory Visit | Attending: Internal Medicine

## 2012-01-21 ENCOUNTER — Encounter (HOSPITAL_COMMUNITY): Payer: Self-pay | Admitting: Anesthesiology

## 2012-01-21 ENCOUNTER — Institutional Professional Consult (permissible substitution) (HOSPITAL_COMMUNITY): Payer: Medicare Other | Admitting: Anesthesiology

## 2012-01-21 DIAGNOSIS — M869 Osteomyelitis, unspecified: Secondary | ICD-10-CM

## 2012-01-21 HISTORY — PX: AMPUTATION: SHX166

## 2012-01-21 LAB — CBC WITH DIFFERENTIAL/PLATELET
HCT: 28.6 % — ABNORMAL LOW (ref 39.0–52.0)
Hemoglobin: 9.2 g/dL — ABNORMAL LOW (ref 13.0–17.0)
Lymphocytes Relative: 29 % (ref 12–46)
Lymphs Abs: 2.7 10*3/uL (ref 0.7–4.0)
MCHC: 32.2 g/dL (ref 30.0–36.0)
Monocytes Absolute: 1 10*3/uL (ref 0.1–1.0)
Monocytes Relative: 11 % (ref 3–12)
Neutro Abs: 5 10*3/uL (ref 1.7–7.7)
RBC: 3.39 MIL/uL — ABNORMAL LOW (ref 4.22–5.81)
WBC: 9.4 10*3/uL (ref 4.0–10.5)

## 2012-01-21 LAB — BASIC METABOLIC PANEL
BUN: 47 mg/dL — ABNORMAL HIGH (ref 6–23)
CO2: 25 mEq/L (ref 19–32)
Chloride: 106 mEq/L (ref 96–112)
Creatinine, Ser: 1.85 mg/dL — ABNORMAL HIGH (ref 0.50–1.35)
Glucose, Bld: 133 mg/dL — ABNORMAL HIGH (ref 70–99)

## 2012-01-21 LAB — GLUCOSE, CAPILLARY: Glucose-Capillary: 126 mg/dL — ABNORMAL HIGH (ref 70–99)

## 2012-01-21 LAB — PROTIME-INR: INR: 1.09 (ref 0.00–1.49)

## 2012-01-21 SURGERY — AMPUTATION, FOOT, RAY
Anesthesia: General | Site: Foot | Laterality: Left | Wound class: Dirty or Infected

## 2012-01-21 MED ORDER — FENTANYL CITRATE 0.05 MG/ML IJ SOLN
INTRAMUSCULAR | Status: DC | PRN
  Administered 2012-01-21 (×2): 25 ug via INTRAVENOUS

## 2012-01-21 MED ORDER — FENTANYL CITRATE 0.05 MG/ML IJ SOLN: 25.0000 ug | INTRAMUSCULAR | Status: DC | PRN

## 2012-01-21 MED ORDER — LACTATED RINGERS IV SOLN
INTRAVENOUS | Status: DC | PRN
  Administered 2012-01-21: 14:00:00 via INTRAVENOUS

## 2012-01-21 MED ORDER — SODIUM CHLORIDE 0.9 % IR SOLN
Status: DC | PRN
  Administered 2012-01-21: 1000 mL

## 2012-01-21 MED ORDER — GENTAMICIN SULFATE 40 MG/ML IJ SOLN
INTRAMUSCULAR | Status: DC | PRN
  Administered 2012-01-21: 160 mg

## 2012-01-21 MED ORDER — ONDANSETRON HCL 4 MG/2ML IJ SOLN: 4.0000 mg | Freq: Four times a day (QID) | INTRAMUSCULAR | Status: DC | PRN

## 2012-01-21 MED ORDER — VANCOMYCIN HCL 500 MG IV SOLR
INTRAVENOUS | Status: DC | PRN
  Administered 2012-01-21: 500 mg

## 2012-01-21 MED ORDER — PROPOFOL 10 MG/ML IV BOLUS
INTRAVENOUS | Status: DC | PRN
  Administered 2012-01-21: 180 mg via INTRAVENOUS

## 2012-01-21 MED ORDER — OXYCODONE HCL 5 MG PO TABS: 5.0000 mg | ORAL_TABLET | Freq: Once | ORAL | Status: AC | PRN

## 2012-01-21 MED ORDER — OXYCODONE HCL 5 MG/5ML PO SOLN: 5.0000 mg | Freq: Once | ORAL | Status: AC | PRN

## 2012-01-21 MED ORDER — LIDOCAINE HCL (CARDIAC) 20 MG/ML IV SOLN
INTRAVENOUS | Status: DC | PRN
  Administered 2012-01-21: 80 mg via INTRAVENOUS

## 2012-01-21 MED ORDER — MIDAZOLAM HCL 5 MG/5ML IJ SOLN
INTRAMUSCULAR | Status: DC | PRN
  Administered 2012-01-21: 2 mg via INTRAVENOUS

## 2012-01-21 SURGICAL SUPPLY — 40 items
BANDAGE ESMARK 6X9 LF (GAUZE/BANDAGES/DRESSINGS) IMPLANT
BANDAGE GAUZE ELAST BULKY 4 IN (GAUZE/BANDAGES/DRESSINGS) ×2 IMPLANT
BLADE SAW SGTL MED 73X18.5 STR (BLADE) IMPLANT
BNDG COHESIVE 4X5 TAN STRL (GAUZE/BANDAGES/DRESSINGS) ×2 IMPLANT
BNDG COHESIVE 6X5 TAN STRL LF (GAUZE/BANDAGES/DRESSINGS) ×2 IMPLANT
BNDG ESMARK 6X9 LF (GAUZE/BANDAGES/DRESSINGS)
CLOTH BEACON ORANGE TIMEOUT ST (SAFETY) ×2 IMPLANT
CUFF TOURNIQUET SINGLE 34IN LL (TOURNIQUET CUFF) IMPLANT
CUFF TOURNIQUET SINGLE 44IN (TOURNIQUET CUFF) IMPLANT
DRAPE U-SHAPE 47X51 STRL (DRAPES) ×4 IMPLANT
DRSG ADAPTIC 3X8 NADH LF (GAUZE/BANDAGES/DRESSINGS) ×2 IMPLANT
DURAPREP 26ML APPLICATOR (WOUND CARE) ×2 IMPLANT
ELECT REM PT RETURN 9FT ADLT (ELECTROSURGICAL) ×2
ELECTRODE REM PT RTRN 9FT ADLT (ELECTROSURGICAL) ×1 IMPLANT
GLOVE BIOGEL PI IND STRL 9 (GLOVE) ×1 IMPLANT
GLOVE BIOGEL PI INDICATOR 9 (GLOVE) ×1
GLOVE SURG ORTHO 9.0 STRL STRW (GLOVE) ×2 IMPLANT
GOWN PREVENTION PLUS XLARGE (GOWN DISPOSABLE) ×2 IMPLANT
GOWN SRG XL XLNG 56XLVL 4 (GOWN DISPOSABLE) ×1 IMPLANT
GOWN STRL NON-REIN XL XLG LVL4 (GOWN DISPOSABLE) ×1
KIT BASIN OR (CUSTOM PROCEDURE TRAY) ×2 IMPLANT
KIT ROOM TURNOVER OR (KITS) ×2 IMPLANT
KIT STIMULAN RAPID CURE 5CC (Orthopedic Implant) ×2 IMPLANT
MANIFOLD NEPTUNE II (INSTRUMENTS) ×2 IMPLANT
NS IRRIG 1000ML POUR BTL (IV SOLUTION) ×2 IMPLANT
PACK ORTHO EXTREMITY (CUSTOM PROCEDURE TRAY) ×2 IMPLANT
PAD ARMBOARD 7.5X6 YLW CONV (MISCELLANEOUS) ×4 IMPLANT
PAD CAST 4YDX4 CTTN HI CHSV (CAST SUPPLIES) ×1 IMPLANT
PADDING CAST COTTON 4X4 STRL (CAST SUPPLIES) ×1
SPONGE GAUZE 4X4 12PLY (GAUZE/BANDAGES/DRESSINGS) ×2 IMPLANT
SPONGE LAP 18X18 X RAY DECT (DISPOSABLE) ×2 IMPLANT
STAPLER VISISTAT 35W (STAPLE) ×2 IMPLANT
STOCKINETTE IMPERVIOUS LG (DRAPES) IMPLANT
SUCTION FRAZIER TIP 10 FR DISP (SUCTIONS) ×2 IMPLANT
SUT ETHILON 2 0 PSLX (SUTURE) ×4 IMPLANT
TOWEL OR 17X24 6PK STRL BLUE (TOWEL DISPOSABLE) ×2 IMPLANT
TOWEL OR 17X26 10 PK STRL BLUE (TOWEL DISPOSABLE) ×2 IMPLANT
TUBE CONNECTING 12X1/4 (SUCTIONS) ×2 IMPLANT
UNDERPAD 30X30 INCONTINENT (UNDERPADS AND DIAPERS) ×2 IMPLANT
WATER STERILE IRR 1000ML POUR (IV SOLUTION) ×2 IMPLANT

## 2012-01-21 NOTE — Op Note (Signed)
OPERATIVE REPORT  DATE OF SURGERY: 01/21/2012  PATIENT:  Matthew Kline,  52 y.o. male  PRE-OPERATIVE DIAGNOSIS:  Osteomyelitis, Abscess left Foot 4th MT  POST-OPERATIVE DIAGNOSIS:  Osteomyelitis, Abscess left Foot 4th MT  PROCEDURE:  Procedure(s): AMPUTATION RAY Fourth ray left foot. Application of antibiotic beads stimulant with 500 mg vancomycin and 160 mg gentamicin. Local tissue rearrangement for wound closure 6 cm x 1 cm  SURGEON:  Surgeon(s): [name], MD  ANESTHESIA:   general  EBL:  Minimal ML  SPECIMEN:  No Specimen  TOURNIQUET:  * No tourniquets in log *  PROCEDURE DETAILS: Patient is a 52 year old gentleman with history of osteomyelitis of the left foot fourth toe. He is undergone previous amputations of the toe and presents at this time with a purulent abscess with osteomyelitis of the fourth metatarsal and presents at this time for fourth ray resection tissue rearrangement for wound closure and application of antibiotic beads. Risks and benefits were discussed patient states he understands wishes to pursued this time. Description of procedure patient was brought to the operating room and underwent a general anesthetic. After adequate levels and anesthesia obtained patient's left lower extremity was prepped using DuraPrep and draped into a sterile field. A racquet incision was made dorsally over the abscess and ulceration over the fourth metatarsal. The metatarsal and abscess skin was resected in one block of tissue. The wound was irrigated with normal saline hemostasis was obtained with electrocautery. The fourth metatarsal was resected through the base of the fourth metatarsal. Antibiotic beads were made and then placed deep within the wound the total of 5 cc of stimulant beads. The local tissue was then rearranged and wound closure was performed with 2-0 nylon. The wound was covered with Adaptic orthopedic sponges ABDs dressing Kerlix and Coban. Patient was  extubated taken to the PACU in stable condition.  PL[facility] C[facility]  PATIENT DISPOSITION:  PACU - hemodynamically stable.   [name], MD 01/21/2012 2:35 PM

## 2012-01-21 NOTE — Progress Notes (Signed)
   INFECTIOUS DISEASE PROGRESS NOTE  ID: Matthew Kline is a 52 y.o. male with   Active Problems:  * No active hospital problems. *   Subjective: Pt in PACU  Abtx:  Anti-infectives     Start     Dose/Rate Route Frequency Ordered Stop   01/21/12 1404   vancomycin (VANCOCIN) powder  Status:  Discontinued          As needed 01/21/12 1405 01/21/12 1427   01/21/12 1403   gentamicin (GARAMYCIN) injection  Status:  Discontinued          As needed 01/21/12 1404 01/21/12 1427   01/21/12 0600   ceFAZolin (ANCEF) IVPB 2 g/50 mL premix        2 g 100 mL/hr over 30 Minutes Intravenous On call to O.R. 01/20/12 1420 01/21/12 1354          Medications: Scheduled:   Objective: Vital signs in last 24 hours: Temp:  [98.3 F (36.8 C)-98.6 F (37 C)] 98.3 F (36.8 C) (01/10 1623) Pulse Rate:  [88-92] 91  (01/10 1616) BP: (116-160)/(64-94) 159/90 mmHg (01/10 1616) SpO2:  [92 %-98 %] 98 % (01/10 1616)   not seen, pt in PACU  Lab Results  Basename 01/21/12 0500  WBC 9.4  HGB 9.2*  HCT 28.6*  NA 140  K 4.3  CL 106  CO2 25  BUN 47*  CREATININE 1.85*  GLU --   Liver Panel No results found for this basename: PROT:2,ALBUMIN:2,AST:2,ALT:2,ALKPHOS:2,BILITOT:2,BILIDIR:2,IBILI:2 in the last 72 hours Sedimentation Rate No results found for this basename: ESRSEDRATE in the last 72 hours C-Reactive Protein No results found for this basename: CRP:2 in the last 72 hours  Microbiology: No results found for this or any previous visit (from the past 240 hour(s)).  Studies/Results: No results found.   Assessment/Plan: Osteomyelitis of L foot  (antibiotics day 38 vanco--> cubicin)  CRI, (somewhat better today) Diabetes  LE edema   Had L 4th ray amputation today Would continue his anbx for 2 weeks post op if margins clean.  Discussed with Dr Laren Everts, will be changed to zyvox. Watch his PLT count on this closely. Also can cause an irreversible neuropathy.  Available if  questions  [name] Infectious Diseases [id number] 01/21/2012, 4:25 PM   LOS: 28 days

## 2012-01-21 NOTE — Anesthesia Preprocedure Evaluation (Addendum)
Anesthesia Evaluation  Patient identified by MRN, date of birth, ID band Patient awake    Reviewed: Allergy & Precautions, H&P , NPO status , Patient's Chart, lab work & pertinent test results  Airway Mallampati: II  Neck ROM: full    Dental   Pulmonary former smoker,          Cardiovascular hypertension,     Neuro/Psych    GI/Hepatic GERD-  ,  Endo/Other  diabetes, Type 2  Renal/GU Renal InsufficiencyRenal disease     Musculoskeletal   Abdominal   Peds  Hematology   Anesthesia Other Findings   Reproductive/Obstetrics                          Anesthesia Physical Anesthesia Plan  ASA: III  Anesthesia Plan: General   Post-op Pain Management:    Induction: Intravenous  Airway Management Planned: LMA  Additional Equipment:   Intra-op Plan:   Post-operative Plan:   Informed Consent: I have reviewed the patients History and Physical, chart, labs and discussed the procedure including the risks, benefits and alternatives for the proposed anesthesia with the patient or authorized representative who has indicated his/her understanding and acceptance.     Plan Discussed with: CRNA and Surgeon  Anesthesia Plan Comments:         Anesthesia Quick Evaluation

## 2012-01-21 NOTE — Preoperative (Signed)
Beta Blockers   Reason not to administer Beta Blockers:Not Applicable 

## 2012-01-21 NOTE — Anesthesia Postprocedure Evaluation (Signed)
Anesthesia Post Note  Patient: Matthew Kline  Procedure(s) Performed: Procedure(s) (LRB): AMPUTATION RAY (Left)  Anesthesia type: General  Patient location: PACU  Post pain: Pain level controlled and Adequate analgesia  Post assessment: Post-op Vital signs reviewed, Patient's Cardiovascular Status Stable, Respiratory Function Stable, Patent Airway and Pain level controlled  Last Vitals:  Filed Vitals:   01/21/12 1501  BP: 139/83  Pulse: 90  Temp:     Post vital signs: Reviewed and stable  Level of consciousness: awake, alert  and oriented  Complications: No apparent anesthesia complications

## 2012-01-21 NOTE — Transfer of Care (Signed)
Immediate Anesthesia Transfer of Care Note  Patient: Matthew Kline  Procedure(s) Performed: Procedure(s) (LRB) with comments: AMPUTATION RAY (Left) - Left Foot 4th Ray Amputation  Patient Location: PACU  Anesthesia Type:General  Level of Consciousness: awake, alert , oriented and patient cooperative  Airway & Oxygen Therapy: Patient Spontanous Breathing and Patient connected to nasal cannula oxygen  Post-op Assessment: Report given to PACU RN, Post -op Vital signs reviewed and stable and Patient moving all extremities  Post vital signs: Reviewed and stable  Complications: No apparent anesthesia complications

## 2012-01-21 NOTE — Anesthesia Procedure Notes (Signed)
Procedure Name: LMA Insertion Date/Time: 01/21/2012 1:50 PM Performed by: Julian Reil Pre-anesthesia Checklist: Patient identified, Emergency Drugs available, Suction available and Patient being monitored Patient Re-evaluated:Patient Re-evaluated prior to inductionOxygen Delivery Method: Circle system utilized Preoxygenation: Pre-oxygenation with 100% oxygen Intubation Type: IV induction LMA: LMA inserted LMA Size: 4.0 Tube type: Oral Number of attempts: 1 Placement Confirmation: positive ETCO2 and breath sounds checked- equal and bilateral Tube secured with: Tape Dental Injury: Teeth and Oropharynx as per pre-operative assessment

## 2012-01-22 NOTE — Progress Notes (Signed)
[name], male   DOB: [birth year], 52 y.o.   MRN: [id number] Postoperative day 1 left foot fourth metatarsal amputation with placement of antibiotic beads. Patient is comfortable.  Physical therapy progressive ambulation nonweightbearing left foot for 2 weeks.  Keep dressing clean dry and intact.

## 2012-01-23 DIAGNOSIS — M7989 Other specified soft tissue disorders: Secondary | ICD-10-CM

## 2012-01-23 DIAGNOSIS — M79609 Pain in unspecified limb: Secondary | ICD-10-CM

## 2012-01-23 NOTE — Progress Notes (Signed)
VASCULAR LAB PRELIMINARY  PRELIMINARY  PRELIMINARY  PRELIMINARY  Right lower extremity venous Doppler completed.    Preliminary report:  There is no DVT or SVT noted in the right lower extremity.  Sy Saintjean, RVT 01/23/2012, 10:12 AM

## 2012-01-24 ENCOUNTER — Encounter (HOSPITAL_COMMUNITY): Payer: Self-pay | Admitting: Orthopedic Surgery

## 2012-01-25 LAB — CBC
MCV: 83 fL (ref 78.0–100.0)
Platelets: 224 10*3/uL (ref 150–400)
RBC: 3.11 MIL/uL — ABNORMAL LOW (ref 4.22–5.81)
WBC: 8.2 10*3/uL (ref 4.0–10.5)

## 2012-01-25 LAB — BASIC METABOLIC PANEL
CO2: 29 mEq/L (ref 19–32)
Chloride: 100 mEq/L (ref 96–112)
Creatinine, Ser: 2.13 mg/dL — ABNORMAL HIGH (ref 0.50–1.35)
GFR calc Af Amer: 39 mL/min — ABNORMAL LOW (ref >90)
Potassium: 3.9 mEq/L (ref 3.5–5.1)
Sodium: 137 mEq/L (ref 135–145)

## 2012-01-27 LAB — BASIC METABOLIC PANEL
CO2: 30 mEq/L (ref 19–32)
Chloride: 102 mEq/L (ref 96–112)
Glucose, Bld: 86 mg/dL (ref 70–99)
Potassium: 4.4 mEq/L (ref 3.5–5.1)
Sodium: 139 mEq/L (ref 135–145)

## 2012-02-01 ENCOUNTER — Encounter (HOSPITAL_COMMUNITY): Payer: Self-pay | Admitting: Emergency Medicine

## 2012-02-01 ENCOUNTER — Emergency Department (HOSPITAL_COMMUNITY)
Admission: EM | Admit: 2012-02-01 | Discharge: 2012-02-01 | Disposition: A | Discharge status: Home / Self Care | Payer: Medicare Other | Attending: Emergency Medicine | Admitting: Emergency Medicine

## 2012-02-01 DIAGNOSIS — K219 Gastro-esophageal reflux disease without esophagitis: Secondary | ICD-10-CM | POA: Insufficient documentation

## 2012-02-01 DIAGNOSIS — E1142 Type 2 diabetes mellitus with diabetic polyneuropathy: Secondary | ICD-10-CM | POA: Insufficient documentation

## 2012-02-01 DIAGNOSIS — R5381 Other malaise: Secondary | ICD-10-CM | POA: Insufficient documentation

## 2012-02-01 DIAGNOSIS — T40605A Adverse effect of unspecified narcotics, initial encounter: Secondary | ICD-10-CM | POA: Insufficient documentation

## 2012-02-01 DIAGNOSIS — Z794 Long term (current) use of insulin: Secondary | ICD-10-CM | POA: Insufficient documentation

## 2012-02-01 DIAGNOSIS — R209 Unspecified disturbances of skin sensation: Secondary | ICD-10-CM | POA: Insufficient documentation

## 2012-02-01 DIAGNOSIS — T50905A Adverse effect of unspecified drugs, medicaments and biological substances, initial encounter: Secondary | ICD-10-CM

## 2012-02-01 DIAGNOSIS — Z79899 Other long term (current) drug therapy: Secondary | ICD-10-CM | POA: Insufficient documentation

## 2012-02-01 DIAGNOSIS — F17200 Nicotine dependence, unspecified, uncomplicated: Secondary | ICD-10-CM | POA: Insufficient documentation

## 2012-02-01 DIAGNOSIS — E785 Hyperlipidemia, unspecified: Secondary | ICD-10-CM | POA: Insufficient documentation

## 2012-02-01 DIAGNOSIS — E1149 Type 2 diabetes mellitus with other diabetic neurological complication: Secondary | ICD-10-CM | POA: Insufficient documentation

## 2012-02-01 DIAGNOSIS — Z8739 Personal history of other diseases of the musculoskeletal system and connective tissue: Secondary | ICD-10-CM | POA: Insufficient documentation

## 2012-02-01 DIAGNOSIS — N289 Disorder of kidney and ureter, unspecified: Secondary | ICD-10-CM | POA: Insufficient documentation

## 2012-02-01 DIAGNOSIS — F29 Unspecified psychosis not due to a substance or known physiological condition: Secondary | ICD-10-CM | POA: Insufficient documentation

## 2012-02-01 DIAGNOSIS — I10 Essential (primary) hypertension: Secondary | ICD-10-CM | POA: Insufficient documentation

## 2012-02-01 DIAGNOSIS — E119 Type 2 diabetes mellitus without complications: Secondary | ICD-10-CM

## 2012-02-01 LAB — CBC WITH DIFFERENTIAL/PLATELET
Eosinophils Absolute: 1.4 10*3/uL — ABNORMAL HIGH (ref 0.0–0.7)
Eosinophils Relative: 12 % — ABNORMAL HIGH (ref 0–5)
HCT: 33.2 % — ABNORMAL LOW (ref 39.0–52.0)
Hemoglobin: 10.5 g/dL — ABNORMAL LOW (ref 13.0–17.0)
Lymphocytes Relative: 34 % (ref 12–46)
Lymphs Abs: 3.9 10*3/uL (ref 0.7–4.0)
MCH: 27.2 pg (ref 26.0–34.0)
MCV: 86 fL (ref 78.0–100.0)
Monocytes Relative: 10 % (ref 3–12)
RBC: 3.86 MIL/uL — ABNORMAL LOW (ref 4.22–5.81)
WBC: 11.7 10*3/uL — ABNORMAL HIGH (ref 4.0–10.5)

## 2012-02-01 LAB — BASIC METABOLIC PANEL
BUN: 54 mg/dL — ABNORMAL HIGH (ref 6–23)
CO2: 26 mEq/L (ref 19–32)
Calcium: 9.8 mg/dL (ref 8.4–10.5)
GFR calc non Af Amer: 29 mL/min — ABNORMAL LOW (ref >90)
Glucose, Bld: 158 mg/dL — ABNORMAL HIGH (ref 70–99)

## 2012-02-01 MED ORDER — GLIPIZIDE 10 MG PO TABS: 10.0000 mg | ORAL_TABLET | Freq: Two times a day (BID) | ORAL | Status: DC

## 2012-02-01 MED ORDER — AMITRIPTYLINE HCL 50 MG PO TABS: 50.0000 mg | ORAL_TABLET | Freq: Every day | ORAL | Status: DC

## 2012-02-01 MED ORDER — ATENOLOL 25 MG PO TABS: 25.0000 mg | ORAL_TABLET | Freq: Every day | ORAL | Status: DC

## 2012-02-01 MED ORDER — FUROSEMIDE 20 MG PO TABS: 20.0000 mg | ORAL_TABLET | Freq: Two times a day (BID) | ORAL | Status: DC

## 2012-02-01 MED ORDER — HYDROMORPHONE HCL 2 MG PO TABS: 2.0000 mg | ORAL_TABLET | ORAL | Status: DC | PRN

## 2012-02-01 MED ORDER — AMLODIPINE BESYLATE 2.5 MG PO TABS: 2.5000 mg | ORAL_TABLET | Freq: Every day | ORAL | Status: DC

## 2012-02-01 MED ORDER — GABAPENTIN 100 MG PO CAPS: 100.0000 mg | ORAL_CAPSULE | Freq: Three times a day (TID) | ORAL | Status: DC

## 2012-02-01 MED ORDER — INSULIN GLARGINE 100 UNIT/ML ~~LOC~~ SOLN: 100.0000 [IU] | Freq: Two times a day (BID) | SUBCUTANEOUS | Status: DC

## 2012-02-01 NOTE — ED Notes (Signed)
[id number] Expected date:<BR> Expected time:<BR> Means of arrival:<BR> Comments:<BR> Possible allergic reaction to Morphine

## 2012-02-01 NOTE — ED Notes (Addendum)
Pt presenting to ed with c/o possible allergic reaction to morphine. Pt states he recently left [place] and was sent to rehab center and started to receive morphine since going to rehab on Friday. Per family at bedside pt was just "out of it and in a fetal position when she arrived to the rehab center this morning". Pt states "I'm coming out of it now." pt states he's in rehab for an infected toe and was sent to rehab because he wasn't suppose to bear weight on his foot. Per fiance at bedside pt is much better at this time.

## 2012-02-01 NOTE — ED Provider Notes (Signed)
History     CSN: [id number]  Arrival date & time 02/01/12  1142   First MD Initiated Contact with Patient 02/01/12 1221      Chief Complaint  Patient presents with  . Allergic Reaction    (Consider location/radiation/quality/duration/timing/severity/associated sxs/prior treatment) The history is provided by the patient, a friend and the EMS personnel.  52 year old male brought from [facility] for adverse reaction to MS Contin over sedation and confusion. S/P Left foot partial amputation by Dr. Sharol Given at [facility] one week ago and then moved to Rehab at [facility]. Scheduled for discharge from there today, but starting yesterday patient with increased confusion and sleepiness, which got worse today. Patient had been getting medicated with MS Contin in past had adverse reaction to Morphine, not allergic, just too sedating and making him very confused. No rash, trouble breathing, hives, lip or tongue swelling. Patient not improving.   Past Medical History  Diagnosis Date  . Polymyalgia rheumatica   . Diabetes mellitus   . Hypertension   . Hyperlipidemia   . Diabetic neuropathy   . Osteomyelitis of foot, left, acute   . GERD (gastroesophageal reflux disease)     Past Surgical History  Procedure Date  . Neck fusion   . Amputation 01/21/2012    Procedure: AMPUTATION RAY;  Surgeon: [name], MD;  Location: [place];  Service: Orthopedics;  Laterality: Left;  Left Foot 4th Ray Amputation    Family History  Problem Relation Age of Onset  . Hypertension Mother     History  Substance Use Topics  . Smoking status: Current Some Day Smoker    Types: Cigarettes    Last Attempt to Quit: 12/21/2011  . Smokeless tobacco: Not on file  . Alcohol Use: No      Review of Systems  Constitutional: Positive for fatigue. Negative for fever.  HENT: Negative for congestion.   Eyes: Negative for visual disturbance.  Respiratory: Negative for shortness of breath.   Cardiovascular: Negative  for chest pain.  Gastrointestinal: Negative for nausea, vomiting and abdominal pain.  Genitourinary: Negative for dysuria.  Musculoskeletal: Negative for back pain.  Skin: Negative for rash.  Neurological: Positive for numbness. Negative for headaches.  Hematological: Does not bruise/bleed easily.  Psychiatric/Behavioral: Positive for confusion.    Allergies  Morphine and related and Tygacil  Home Medications   Current Outpatient Rx  Name  Route  Sig  Dispense  Refill  . AMITRIPTYLINE HCL 50 MG PO TABS   Oral   Take 50 mg by mouth at bedtime.         Marland Kitchen AMLODIPINE BESYLATE 2.5 MG PO TABS   Oral   Take 2.5 mg by mouth daily.         . ATENOLOL 25 MG PO TABS   Oral   Take 25 mg by mouth every morning.          . FUROSEMIDE 20 MG PO TABS   Oral   Take 20 mg by mouth 2 (two) times daily.         Marland Kitchen GABAPENTIN 100 MG PO CAPS   Oral   Take 400 mg by mouth 3 (three) times daily. Take four 100 mg capsules (400 mg total) three times daily         . GLIPIZIDE 10 MG PO TABS   Oral   Take 10 mg by mouth 2 (two) times daily before a meal.         .  INSULIN GLARGINE 100 UNIT/ML [place] SOLN   Subcutaneous   Inject 12 Units into the skin daily with breakfast.          . LOSARTAN POTASSIUM 25 MG PO TABS   Oral   Take 25 mg by mouth daily.         . ADULT MULTIVITAMIN W/MINERALS CH   Oral   Take 1 tablet by mouth daily.         . SUCRALFATE 1 G PO TABS   Oral   Take 1 g by mouth 4 (four) times daily.         Marland Kitchen AMITRIPTYLINE HCL 50 MG PO TABS   Oral   Take 1 tablet (50 mg total) by mouth at bedtime.   30 tablet   1   . AMLODIPINE BESYLATE 2.5 MG PO TABS   Oral   Take 1 tablet (2.5 mg total) by mouth daily.   30 tablet   1   . ATENOLOL 25 MG PO TABS   Oral   Take 1 tablet (25 mg total) by mouth daily.   30 tablet   1   . FUROSEMIDE 20 MG PO TABS   Oral   Take 1 tablet (20 mg total) by mouth 2 (two) times daily.   60 tablet   1   . GABAPENTIN  100 MG PO CAPS   Oral   Take 1 capsule (100 mg total) by mouth 3 (three) times daily.   90 capsule   1   . GLIPIZIDE 10 MG PO TABS   Oral   Take 1 tablet (10 mg total) by mouth 2 (two) times daily before a meal.   60 tablet   1   . HYDROMORPHONE HCL 2 MG PO TABS   Oral   Take 1 tablet (2 mg total) by mouth every 4 (four) hours as needed for pain.   30 tablet   0   . INSULIN GLARGINE 100 UNIT/ML [place] SOLN   Subcutaneous   Inject 100 Units into the skin 2 (two) times daily.   10 mL   12     BP 155/89  Pulse 69  Temp 97.7 F (36.5 C) (Oral)  Resp 18  SpO2 94%  Physical Exam  Nursing note and vitals reviewed. Constitutional: He is oriented to person, place, and time. He appears well-developed and well-nourished. No distress.  HENT:  Head: Normocephalic and atraumatic.       Mucous membranes dry.  Eyes: Conjunctivae normal and EOM are normal. Pupils are equal, round, and reactive to light.  Neck: Normal range of motion.  Cardiovascular: Normal rate, regular rhythm and normal heart sounds.   No murmur heard. Abdominal: Soft. Bowel sounds are normal. There is no tenderness.  Musculoskeletal: Normal range of motion.       Left foot partial amputation, dressing in place, no drainage, no tenderness.   Neurological: He is alert and oriented to person, place, and time. No cranial nerve deficit. He exhibits normal muscle tone. Coordination normal.  Skin: Skin is warm. No rash noted. No erythema.    ED Course  Procedures (including critical care time)  Labs Reviewed  CBC WITH DIFFERENTIAL - Abnormal; Notable for the following:    WBC 11.7 (*)     RBC 3.86 (*)     Hemoglobin 10.5 (*)     HCT 33.2 (*)     Monocytes Absolute 1.2 (*)     Eosinophils Relative 12 (*)  Eosinophils Absolute 1.4 (*)     All other components within normal limits  BASIC METABOLIC PANEL - Abnormal; Notable for the following:    Potassium 5.5 (*)     Glucose, Bld 158 (*)     BUN 54 (*)      Creatinine, Ser 2.42 (*)     GFR calc non Af Amer 29 (*)     GFR calc Af Amer 34 (*)     All other components within normal limits   No results found. Results for orders placed during the hospital encounter of 02/01/12  CBC WITH DIFFERENTIAL      Component Value Range   WBC 11.7 (*) 4.0 - 10.5 K/uL   RBC 3.86 (*) 4.22 - 5.81 MIL/uL   Hemoglobin 10.5 (*) 13.0 - 17.0 g/dL   HCT 33.2 (*) 39.0 - 52.0 %   MCV 86.0  78.0 - 100.0 fL   MCH 27.2  26.0 - 34.0 pg   MCHC 31.6  30.0 - 36.0 g/dL   RDW 13.9  11.5 - 15.5 %   Platelets 246  150 - 400 K/uL   Neutrophils Relative 44  43 - 77 %   Neutro Abs 5.1  1.7 - 7.7 K/uL   Lymphocytes Relative 34  12 - 46 %   Lymphs Abs 3.9  0.7 - 4.0 K/uL   Monocytes Relative 10  3 - 12 %   Monocytes Absolute 1.2 (*) 0.1 - 1.0 K/uL   Eosinophils Relative 12 (*) 0 - 5 %   Eosinophils Absolute 1.4 (*) 0.0 - 0.7 K/uL   Basophils Relative 1  0 - 1 %   Basophils Absolute 0.1  0.0 - 0.1 K/uL  BASIC METABOLIC PANEL      Component Value Range   Sodium 138  135 - 145 mEq/L   Potassium 5.5 (*) 3.5 - 5.1 mEq/L   Chloride 105  96 - 112 mEq/L   CO2 26  19 - 32 mEq/L   Glucose, Bld 158 (*) 70 - 99 mg/dL   BUN 54 (*) 6 - 23 mg/dL   Creatinine, Ser 2.42 (*) 0.50 - 1.35 mg/dL   Calcium 9.8  8.4 - 10.5 mg/dL   GFR calc non Af Amer 29 (*) >90 mL/min   GFR calc Af Amer 34 (*) >90 mL/min     1. Adverse drug reaction   2. Diabetes   3. Renal insufficiency       MDM   Patient status post a left foot partial amputation [place] one week ago. That was done by Dr. Sharol Given. Following that patient was placed and the rehabilitation that [facility]. He was scheduled for discharge from there today. Friends noted that he had been confused and not responsive feels a tightening getting too much MS Contin. Patient has had trouble with morphine before making him very unresponsive. Patient in the ED here has improved significantly. Symptoms would be consistent with an  adverse reaction to the MS Contin. Patient is now functional.  He will need his meds renewed. Prefers oral dilaudid for pain control.   Labs with renal insufficiency, not significantly worse, potassium currently 5.5 in past looking at labs has been 5.0  And tends to go up as glucose elevates slightly. Patients PCM is in [place] and will have it rechecked in a few days. Also suspect some degree of dehydration.             Mervin Kung, MD 02/02/12 [phone]

## 2012-02-07 ENCOUNTER — Inpatient Hospital Stay: Payer: Medicare Other | Admitting: Infectious Disease

## 2012-02-21 ENCOUNTER — Inpatient Hospital Stay: Payer: Medicare Other | Admitting: Infectious Diseases

## 2012-02-21 ENCOUNTER — Telehealth: Payer: Self-pay | Admitting: *Deleted

## 2012-02-21 NOTE — Telephone Encounter (Signed)
Attempted to call patient to notify of missed appointment; voicemail has not yet been set up. Landis Gandy, RN

## 2012-04-07 ENCOUNTER — Other Ambulatory Visit: Payer: Self-pay | Admitting: Geriatric Medicine

## 2012-04-07 MED ORDER — AMITRIPTYLINE HCL 50 MG PO TABS: 50.0000 mg | ORAL_TABLET | Freq: Every day | ORAL | Status: DC

## 2012-04-07 MED ORDER — GABAPENTIN 100 MG PO CAPS: 100.0000 mg | ORAL_CAPSULE | Freq: Three times a day (TID) | ORAL | Status: DC

## 2012-04-07 MED ORDER — FUROSEMIDE 20 MG PO TABS: 20.0000 mg | ORAL_TABLET | Freq: Two times a day (BID) | ORAL | Status: DC

## 2012-04-07 MED ORDER — ATORVASTATIN CALCIUM 20 MG PO TABS: 20.0000 mg | ORAL_TABLET | Freq: Every day | ORAL | Status: DC

## 2012-04-07 MED ORDER — ATENOLOL 25 MG PO TABS: 25.0000 mg | ORAL_TABLET | Freq: Every morning | ORAL | Status: DC

## 2012-04-07 MED ORDER — GLIPIZIDE 10 MG PO TABS: 10.0000 mg | ORAL_TABLET | Freq: Two times a day (BID) | ORAL | Status: DC

## 2012-05-09 ENCOUNTER — Encounter: Payer: Self-pay | Admitting: *Deleted

## 2012-05-10 ENCOUNTER — Encounter: Payer: Self-pay | Admitting: Internal Medicine

## 2012-05-10 ENCOUNTER — Ambulatory Visit (INDEPENDENT_AMBULATORY_CARE_PROVIDER_SITE_OTHER): Payer: Medicare Other | Admitting: Internal Medicine

## 2012-05-10 VITALS — BP 132/88 | HR 68 | Temp 98.2°F | Resp 14 | Ht 70.0 in | Wt 222.2 lb

## 2012-05-10 DIAGNOSIS — D638 Anemia in other chronic diseases classified elsewhere: Secondary | ICD-10-CM

## 2012-05-10 DIAGNOSIS — E785 Hyperlipidemia, unspecified: Secondary | ICD-10-CM | POA: Insufficient documentation

## 2012-05-10 DIAGNOSIS — E875 Hyperkalemia: Secondary | ICD-10-CM

## 2012-05-10 DIAGNOSIS — I10 Essential (primary) hypertension: Secondary | ICD-10-CM | POA: Insufficient documentation

## 2012-05-10 DIAGNOSIS — R3915 Urgency of urination: Secondary | ICD-10-CM

## 2012-05-10 DIAGNOSIS — E559 Vitamin D deficiency, unspecified: Secondary | ICD-10-CM | POA: Insufficient documentation

## 2012-05-10 DIAGNOSIS — Z125 Encounter for screening for malignant neoplasm of prostate: Secondary | ICD-10-CM

## 2012-05-10 DIAGNOSIS — G909 Disorder of the autonomic nervous system, unspecified: Secondary | ICD-10-CM

## 2012-05-10 DIAGNOSIS — E1143 Type 2 diabetes mellitus with diabetic autonomic (poly)neuropathy: Secondary | ICD-10-CM | POA: Insufficient documentation

## 2012-05-10 DIAGNOSIS — E089 Diabetes mellitus due to underlying condition without complications: Secondary | ICD-10-CM | POA: Insufficient documentation

## 2012-05-10 DIAGNOSIS — L84 Corns and callosities: Secondary | ICD-10-CM | POA: Insufficient documentation

## 2012-05-10 DIAGNOSIS — E1149 Type 2 diabetes mellitus with other diabetic neurological complication: Secondary | ICD-10-CM

## 2012-05-10 DIAGNOSIS — N184 Chronic kidney disease, stage 4 (severe): Secondary | ICD-10-CM

## 2012-05-10 MED ORDER — VITAMIN D3 1.25 MG (50000 UT) PO CAPS: 1.0000 | ORAL_CAPSULE | ORAL | Status: DC

## 2012-05-10 MED ORDER — GABAPENTIN 100 MG PO CAPS: 400.0000 mg | ORAL_CAPSULE | Freq: Every day | ORAL | Status: DC

## 2012-05-10 MED ORDER — GLIPIZIDE 10 MG PO TABS: 10.0000 mg | ORAL_TABLET | Freq: Every day | ORAL | Status: DC

## 2012-05-10 NOTE — Progress Notes (Signed)
Subjective:    [name], male    DOB: [birth year], 52 y.o.   MRN: [id number]  HPI Patient here for his routine visit.  He has not seen dr hatcher from ID and does not want to follow up for now as he is free of symptoms He follows with dr duda and was provided with orthotics. He has another appointment on 06/22/12. No specific concerns this visit Colonoscopy 2013 which was normal uptodate with flu and pneumococcal vaccine  would like his prostate enzyme checked Has noticed worsening pin and needle sensation in his feet going upto his leg with ocassional tingling and numbness uptodate with eye exam Has not seen a foot doctor  Review of Systems  Constitutional: Negative for fever, chills, diaphoresis, appetite change, fatigue and unexpected weight change.  HENT: Positive for neck pain. Negative for hearing loss, congestion, sore throat, mouth sores, sinus pressure and ear discharge.   Eyes: Negative for visual disturbance.  Respiratory: Negative for chest tightness and shortness of breath.   Cardiovascular: Positive for leg swelling. Negative for chest pain and palpitations.  Gastrointestinal: Negative for nausea, vomiting, abdominal pain, constipation and blood in stool.  Genitourinary: Positive for urgency. Negative for dysuria, frequency, discharge and genital sores.  Musculoskeletal: Positive for back pain and arthralgias.  Skin: Positive for wound. Negative for rash.  Neurological: Negative for dizziness, syncope, light-headedness, numbness and headaches.  Hematological: Negative for adenopathy.  Psychiatric/Behavioral: Negative for behavioral problems and sleep disturbance. The patient is not nervous/anxious.        Objective:   Physical Exam  Constitutional: He is oriented to person, place, and time. He appears well-developed and well-nourished. No distress.  HENT:  Head: Normocephalic and atraumatic.  Right Ear: External ear normal.  Left Ear: External  ear normal.  Nose: Nose normal.  Mouth/Throat: Oropharynx is clear and moist. No oropharyngeal exudate.  Eyes: Conjunctivae are normal. Pupils are equal, round, and reactive to light.  Neck: Normal range of motion. Neck supple. No JVD present. No tracheal deviation present. No thyromegaly present.  Cardiovascular: Normal rate, regular rhythm, normal heart sounds and intact distal pulses.   No murmur heard. No carotid bruit  Pulmonary/Chest: Effort normal and breath sounds normal. No respiratory distress. He has no wheezes. He has no rales. He exhibits no tenderness.  Abdominal: Soft. Bowel sounds are normal. He exhibits no distension and no mass. There is no tenderness. There is no rebound and no guarding.  Genitourinary:  Refused genital and rectal exam  Musculoskeletal: Normal range of motion. He exhibits edema. He exhibits no tenderness.  Left 4th toe amputated, scar tissue well healed, right toe 2 amputated, has c3-c6 fusion  Lymphadenopathy:    He has no cervical adenopathy.  Neurological: He is alert and oriented to person, place, and time. He has normal reflexes.  Decreased microfilament pinprick sensation upto below knee bilaterally, vibration intact  Skin: Skin is warm and dry. No rash noted. He is not diaphoretic. No erythema. No pallor.  Has callus in right foot and mild erythema with open sore in medial cuneiform bone, no drainage, non tender  Psychiatric: He has a normal mood and affect. His behavior is normal. Judgment and thought content normal.    BP 132/88  Pulse 68  Temp(Src) 98.2 F (36.8 C) (Oral)  Resp 14  Ht 5\' 10"  (1.778 m)  Wt 222 lb 3.2 oz (100.789 kg)  BMI 31.88 kg/m2  LABS REVIEWED 03/07/12- Hb 11.9, hct 37.1, wbc 9.1, plt 281  Bun 54, cr 2.14, gfr 34, na 140, k 5.9, glu 71, cl 105, co2 23, ca 9.6, lft wnl, a1c 6.6, t.chol 298, tg 311, hdl 47, ldl 189, urine microalbumin 2329.2, vitamin d 13.9 05/10/12 ekg- normal sinus rhythm, no peaked t waves, no acute  st-t wave changes, no old ekg to compare with    Assessment & Plan:   HTN- sbp mildly elevated this visit. Will not make any changes to his bp meds at this point, to check bp at home 2-3 times a week and bring readings to office for review. Continue atenolol and amlodipine. Encouraged to stop smoking. ekg reviewed with patient.  Hyperlipidemia- reviewed labs with pt and was recently started on atorvastatin. Will continue this for now and check lft and flp in 4 months from now- 6 months from med initiation. Encouraged diet modification  Peripheral neuropathy- worsened. Likely from his longstanding DM. Will increase gabapentin from 100 mg tid to 400 mg daily. Continue amitriptyline.  Diabetes mellitus- reviewed a1c 6.6. Reviewed home cbg 65-140. Will decrease glipizide to 10 mg daily for now. Has microalbuminuria and peripheral neuropathy. Increased gabapentin dose. uptodate with eye exam. Continue asa and statin. Not on ACEI/ARB due to poor renal function. Will provide podiatry and renal referral. Check a1c prior to next visit  Edema- most likely from venous stasis. On lasix 20 mg bid and tolerating it will. Continue this and check bmp  Hyperkalemia- his ckd likely contributing to this. Recheck bmp today  Vitamin d deficiency- his ckd could be contributing to this. Will have him on vit d 50,000 iu once a week for now. To check PTH level. Normal calcium level. Will have these test done at renal clinic  CKD stage 4- worsening renal function most likely from long standing htn and DM. Will provide renal referral with microalbuminuria. Will need renal usg, PTH level checked  Callus- has callus on his feet and peripheral neuropathy. Will provide podiatry referral  General medical exam- uptodate with immunization. Will check psa for screening with hx of urgency with urination. Explained the interpretation of elevated PSA to patient and he voices understanding this. uptodate with colonoscopy  Anemia  of chronic disease- has low hb/hct likely from chronic htn and renal disease with DM. Will monitor cbc for now hb > 11 at present and no signs of bleeding  Esophageal ulcer- continue sucralfate. Symptoms under control

## 2012-05-10 NOTE — Patient Instructions (Addendum)
You will be called with appointment with your foot doctor and kidney doctor Do not take NSAIDS like ibuprofen, aleve, advil Have your blood work atleast few days prior to next office visit Check Blood pressure in home 2 times a week and bring readings your next office visit

## 2012-05-11 LAB — BASIC METABOLIC PANEL
BUN/Creatinine Ratio: 21 — ABNORMAL HIGH (ref 9–20)
BUN: 54 mg/dL — ABNORMAL HIGH (ref 6–24)
CO2: 23 mmol/L (ref 19–28)
Chloride: 108 mmol/L (ref 97–108)
Creatinine, Ser: 2.56 mg/dL — ABNORMAL HIGH (ref 0.76–1.27)
GFR calc Af Amer: 32 mL/min/{1.73_m2} — ABNORMAL LOW (ref >59)
Sodium: 142 mmol/L (ref 134–144)

## 2012-05-16 ENCOUNTER — Other Ambulatory Visit: Payer: Self-pay | Admitting: Geriatric Medicine

## 2012-05-16 NOTE — Addendum Note (Signed)
Addended byBlanchie Serve on: 05/16/2012 02:08 PM   Modules accepted: Orders

## 2012-05-17 ENCOUNTER — Other Ambulatory Visit: Payer: Medicare Other

## 2012-05-17 DIAGNOSIS — E875 Hyperkalemia: Secondary | ICD-10-CM

## 2012-05-18 ENCOUNTER — Telehealth: Payer: Self-pay | Admitting: *Deleted

## 2012-05-18 LAB — BASIC METABOLIC PANEL
BUN/Creatinine Ratio: 25 — ABNORMAL HIGH (ref 9–20)
BUN: 61 mg/dL — ABNORMAL HIGH (ref 6–24)
CO2: 21 mmol/L (ref 19–28)
Creatinine, Ser: 2.47 mg/dL — ABNORMAL HIGH (ref 0.76–1.27)
GFR calc Af Amer: 33 mL/min/{1.73_m2} — ABNORMAL LOW (ref >59)
Sodium: 139 mmol/L (ref 134–144)

## 2012-05-18 NOTE — Telephone Encounter (Signed)
[facility] [place] [id number] [phone] Phone [phone] Fax   19 May 2012 at 11:15 AM with Dr Justin Mend Patient has been notified

## 2012-06-02 ENCOUNTER — Other Ambulatory Visit: Payer: Self-pay | Admitting: *Deleted

## 2012-06-08 ENCOUNTER — Other Ambulatory Visit: Payer: Self-pay

## 2012-06-08 MED ORDER — GLIPIZIDE 10 MG PO TABS: 10.0000 mg | ORAL_TABLET | Freq: Every day | ORAL | Status: DC

## 2012-06-08 MED ORDER — ATENOLOL 25 MG PO TABS: 25.0000 mg | ORAL_TABLET | Freq: Every morning | ORAL | Status: DC

## 2012-06-08 MED ORDER — ATORVASTATIN CALCIUM 20 MG PO TABS: 20.0000 mg | ORAL_TABLET | Freq: Every day | ORAL | Status: DC

## 2012-06-13 ENCOUNTER — Other Ambulatory Visit: Payer: Self-pay | Admitting: *Deleted

## 2012-06-13 MED ORDER — ZOLPIDEM TARTRATE 10 MG PO TABS: ORAL_TABLET | ORAL | Status: DC

## 2012-06-19 ENCOUNTER — Other Ambulatory Visit: Payer: Self-pay | Admitting: *Deleted

## 2012-06-19 MED ORDER — AMITRIPTYLINE HCL 50 MG PO TABS: 50.0000 mg | ORAL_TABLET | Freq: Every day | ORAL | Status: DC

## 2012-06-19 MED ORDER — SUCRALFATE 1 G PO TABS: 1.0000 g | ORAL_TABLET | Freq: Four times a day (QID) | ORAL | Status: DC

## 2012-06-19 MED ORDER — FUROSEMIDE 20 MG PO TABS: 20.0000 mg | ORAL_TABLET | Freq: Two times a day (BID) | ORAL | Status: DC

## 2012-06-19 MED ORDER — VITAMIN D3 1.25 MG (50000 UT) PO CAPS: 1.0000 | ORAL_CAPSULE | ORAL | Status: DC

## 2012-06-19 MED ORDER — AMLODIPINE BESYLATE 2.5 MG PO TABS: 2.5000 mg | ORAL_TABLET | Freq: Every day | ORAL | Status: DC

## 2012-06-19 MED ORDER — GABAPENTIN 100 MG PO CAPS: 400.0000 mg | ORAL_CAPSULE | Freq: Every day | ORAL | Status: DC

## 2012-06-21 ENCOUNTER — Other Ambulatory Visit: Payer: Self-pay | Admitting: *Deleted

## 2012-06-21 NOTE — Telephone Encounter (Signed)
Patient wanted to know why we did not fill his medications. I told him that I filled 6 medications on 06/19/2012. That they were faxed back to [facility].Marland Kitchen He thank me and hung up

## 2012-06-27 ENCOUNTER — Other Ambulatory Visit: Payer: Self-pay | Admitting: *Deleted

## 2012-06-27 MED ORDER — GLIPIZIDE 10 MG PO TABS: ORAL_TABLET | ORAL | Status: DC

## 2012-06-27 MED ORDER — FUROSEMIDE 20 MG PO TABS: 20.0000 mg | ORAL_TABLET | Freq: Two times a day (BID) | ORAL | Status: DC

## 2012-06-27 NOTE — Telephone Encounter (Signed)
Patient called and stated that [facility] would not send out his Refills. I called them # [phone] and they stated that they had questions on Vitamins D3. Took care of this and called in his Glipizide which patient is only suppose to be taking one tablet once daily. Patient Notified.

## 2012-07-06 ENCOUNTER — Ambulatory Visit: Payer: Medicare Other | Admitting: Nurse Practitioner

## 2012-08-16 ENCOUNTER — Encounter: Payer: Self-pay | Admitting: *Deleted

## 2012-08-16 DIAGNOSIS — R601 Generalized edema: Secondary | ICD-10-CM

## 2012-08-16 DIAGNOSIS — M47812 Spondylosis without myelopathy or radiculopathy, cervical region: Secondary | ICD-10-CM

## 2012-08-16 DIAGNOSIS — B3781 Candidal esophagitis: Secondary | ICD-10-CM

## 2012-08-16 DIAGNOSIS — R55 Syncope and collapse: Secondary | ICD-10-CM

## 2012-08-16 DIAGNOSIS — M353 Polymyalgia rheumatica: Secondary | ICD-10-CM

## 2012-08-16 DIAGNOSIS — K2960 Other gastritis without bleeding: Secondary | ICD-10-CM

## 2012-08-16 DIAGNOSIS — I6381 Other cerebral infarction due to occlusion or stenosis of small artery: Secondary | ICD-10-CM

## 2012-08-16 DIAGNOSIS — K648 Other hemorrhoids: Secondary | ICD-10-CM

## 2012-08-17 ENCOUNTER — Encounter: Payer: Self-pay | Admitting: *Deleted

## 2012-08-17 DIAGNOSIS — M353 Polymyalgia rheumatica: Secondary | ICD-10-CM | POA: Insufficient documentation

## 2012-08-17 DIAGNOSIS — K2960 Other gastritis without bleeding: Secondary | ICD-10-CM | POA: Insufficient documentation

## 2012-08-17 DIAGNOSIS — I6381 Other cerebral infarction due to occlusion or stenosis of small artery: Secondary | ICD-10-CM | POA: Insufficient documentation

## 2012-08-21 ENCOUNTER — Other Ambulatory Visit: Payer: Self-pay | Admitting: Geriatric Medicine

## 2012-08-21 MED ORDER — GLIPIZIDE 10 MG PO TABS: ORAL_TABLET | ORAL | Status: DC

## 2012-08-25 HISTORY — PX: KNEE ARTHROSCOPY: SHX127

## 2012-09-08 ENCOUNTER — Other Ambulatory Visit: Payer: Self-pay

## 2012-09-08 DIAGNOSIS — N186 End stage renal disease: Secondary | ICD-10-CM

## 2012-09-08 DIAGNOSIS — Z01810 Encounter for preprocedural cardiovascular examination: Secondary | ICD-10-CM

## 2012-09-15 ENCOUNTER — Other Ambulatory Visit: Payer: Medicare HMO

## 2012-09-15 ENCOUNTER — Other Ambulatory Visit: Payer: Self-pay | Admitting: *Deleted

## 2012-09-15 DIAGNOSIS — I10 Essential (primary) hypertension: Secondary | ICD-10-CM

## 2012-09-15 DIAGNOSIS — E785 Hyperlipidemia, unspecified: Secondary | ICD-10-CM

## 2012-09-15 DIAGNOSIS — Z125 Encounter for screening for malignant neoplasm of prostate: Secondary | ICD-10-CM

## 2012-09-15 DIAGNOSIS — D638 Anemia in other chronic diseases classified elsewhere: Secondary | ICD-10-CM

## 2012-09-15 DIAGNOSIS — E1149 Type 2 diabetes mellitus with other diabetic neurological complication: Secondary | ICD-10-CM

## 2012-09-15 DIAGNOSIS — R3915 Urgency of urination: Secondary | ICD-10-CM

## 2012-09-16 LAB — CBC WITH DIFFERENTIAL/PLATELET
Basos: 1 % (ref 0–3)
Eos: 4 % (ref 0–5)
Hemoglobin: 10.6 g/dL — ABNORMAL LOW (ref 12.6–17.7)
Lymphs: 32 % (ref 14–46)
MCH: 28.6 pg (ref 26.6–33.0)
Monocytes: 7 % (ref 4–12)
Neutrophils Absolute: 6.3 10*3/uL (ref 1.4–7.0)
RBC: 3.71 x10E6/uL — ABNORMAL LOW (ref 4.14–5.80)
WBC: 11.2 10*3/uL — ABNORMAL HIGH (ref 3.4–10.8)

## 2012-09-16 LAB — COMPREHENSIVE METABOLIC PANEL
AST: 14 IU/L (ref 0–40)
Albumin/Globulin Ratio: 1.2 (ref 1.1–2.5)
Calcium: 9.4 mg/dL (ref 8.7–10.2)
Creatinine, Ser: 3.48 mg/dL — ABNORMAL HIGH (ref 0.76–1.27)
GFR calc Af Amer: 22 mL/min/{1.73_m2} — ABNORMAL LOW (ref >59)
GFR calc non Af Amer: 19 mL/min/{1.73_m2} — ABNORMAL LOW (ref >59)
Globulin, Total: 3 g/dL (ref 1.5–4.5)
Sodium: 140 mmol/L (ref 134–144)
Total Bilirubin: 0.2 mg/dL (ref 0.0–1.2)

## 2012-09-16 LAB — LIPID PANEL
Chol/HDL Ratio: 5.7 ratio units — ABNORMAL HIGH (ref 0.0–5.0)
HDL: 40 mg/dL (ref >39)
VLDL Cholesterol Cal: 75 mg/dL — ABNORMAL HIGH (ref 5–40)

## 2012-09-16 LAB — HEMOGLOBIN A1C
Est. average glucose Bld gHb Est-mCnc: 151 mg/dL
Hgb A1c MFr Bld: 6.9 % — ABNORMAL HIGH (ref 4.8–5.6)

## 2012-09-19 ENCOUNTER — Encounter: Payer: Self-pay | Admitting: Internal Medicine

## 2012-09-19 ENCOUNTER — Ambulatory Visit (INDEPENDENT_AMBULATORY_CARE_PROVIDER_SITE_OTHER): Payer: Commercial Managed Care - HMO | Admitting: Internal Medicine

## 2012-09-19 VITALS — BP 124/78 | HR 81 | Temp 97.8°F | Resp 18 | Ht 70.0 in | Wt 230.0 lb

## 2012-09-19 DIAGNOSIS — I10 Essential (primary) hypertension: Secondary | ICD-10-CM

## 2012-09-19 DIAGNOSIS — IMO0001 Reserved for inherently not codable concepts without codable children: Secondary | ICD-10-CM

## 2012-09-19 DIAGNOSIS — D72829 Elevated white blood cell count, unspecified: Secondary | ICD-10-CM | POA: Insufficient documentation

## 2012-09-19 DIAGNOSIS — K219 Gastro-esophageal reflux disease without esophagitis: Secondary | ICD-10-CM

## 2012-09-19 DIAGNOSIS — E785 Hyperlipidemia, unspecified: Secondary | ICD-10-CM

## 2012-09-19 DIAGNOSIS — E1143 Type 2 diabetes mellitus with diabetic autonomic (poly)neuropathy: Secondary | ICD-10-CM

## 2012-09-19 DIAGNOSIS — G909 Disorder of the autonomic nervous system, unspecified: Secondary | ICD-10-CM

## 2012-09-19 DIAGNOSIS — D638 Anemia in other chronic diseases classified elsewhere: Secondary | ICD-10-CM

## 2012-09-19 DIAGNOSIS — R809 Proteinuria, unspecified: Secondary | ICD-10-CM

## 2012-09-19 DIAGNOSIS — E1129 Type 2 diabetes mellitus with other diabetic kidney complication: Secondary | ICD-10-CM

## 2012-09-19 DIAGNOSIS — E1149 Type 2 diabetes mellitus with other diabetic neurological complication: Secondary | ICD-10-CM

## 2012-09-19 DIAGNOSIS — N184 Chronic kidney disease, stage 4 (severe): Secondary | ICD-10-CM

## 2012-09-19 MED ORDER — ATORVASTATIN CALCIUM 20 MG PO TABS: 40.0000 mg | ORAL_TABLET | Freq: Every day | ORAL | Status: DC

## 2012-09-19 MED ORDER — ATORVASTATIN CALCIUM 40 MG PO TABS: 40.0000 mg | ORAL_TABLET | Freq: Every day | ORAL | Status: DC

## 2012-09-19 NOTE — Progress Notes (Signed)
[name], male   DOB: Apr 20, 1960, 52 y.o.   MRN: [id number]  Chief Complaint  Patient presents with  . Follow-up   Allergies  Allergen Reactions  . Morphine And Related     hallucinations  . Tygacil [Tigecycline] Nausea And Vomiting   HPI Patient here for his routine visit. Since his last visit, he has undergone arthroscopic surgery for menisceal tear on his left knee 08/11/12. It was performed by Dr Sharol Given. He feels he has been retaining more fluids post this and has also noticed gain in his weigh and swelling in left leg. He has been taking lasix 20 mg bid He saw his nephrologist a week back Dr Justin Mend. He has worsening of his renal function on review of his lab. He has appointment with DR Trula Slade from vascular surgery 10/07/12. Plan is for dialysis  Of note-- Colonoscopy 2013 which was normal uptodate with flu and pneumococcal vaccine   Review of Systems  Constitutional: Negative for fever, chills, diaphoresis, appetite change, fatigue and unexpected weight change.  HENT: Negative for hearing loss, congestion, sore throat, mouth sores, sinus pressure and ear discharge.   Eyes: Negative for visual disturbance.  Respiratory: Negative for chest tightness and shortness of breath.   Cardiovascular: Positive for leg swelling. Negative for chest pain and palpitations.  Gastrointestinal: Negative for nausea, vomiting, abdominal pain, constipation and blood in stool.  Genitourinary: Positive for urgency. Negative for dysuria, frequency, discharge and genital sores.  Musculoskeletal: Positive for back pain and arthralgias.  Skin: Negative for rash and wound Neurological: Negative for dizziness, syncope, light-headedness, numbness and headaches.  Hematological: Negative for adenopathy.  Psychiatric/Behavioral: Negative for behavioral problems and sleep disturbance. The patient is not nervous/anxious.    Past Medical History  Diagnosis Date  . Polymyalgia rheumatica   .  Hypertension   . Hyperlipidemia   . Diabetic neuropathy   . Osteomyelitis of foot, left, acute   . Anxiety   . Insomnia, unspecified   . Unspecified vitamin D deficiency   . Anemia, unspecified   . Other chronic postoperative pain   . Unspecified hereditary and idiopathic peripheral neuropathy   . CHF (congestive heart failure)   . Allergy   . Chronic kidney disease   . Unspecified osteomyelitis, site unspecified   . Long term (current) use of anticoagulants   . 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 Ulcer     diabetic foot   . GERD (gastroesophageal reflux disease) 01/27/2009  . Diabetes mellitus 01/27/2009  . Arthralgia 2010    polyarticular  . CHF (congestive heart failure) 07/25/2009  . Hemorrhoids, internal 10/2011    small   Past Surgical History  Procedure Laterality Date  . Amputation  01/21/2012    Procedure: AMPUTATION RAY;  Surgeon: [name], MD;  Location: [place];  Service: Orthopedics;  Laterality: Left;  Left Foot 4th Ray Amputation  . Cervical discectomy  01/2011   Current Outpatient Prescriptions on File Prior to Visit  Medication Sig Dispense Refill  . amitriptyline (ELAVIL) 50 MG tablet Take 1 tablet (50 mg total) by mouth at bedtime.  90 tablet  3  . amLODipine (NORVASC) 2.5 MG tablet Take 1 tablet (2.5 mg total) by mouth daily.  90 tablet  3  . atenolol (TENORMIN) 25 MG tablet Take 1 tablet (25 mg total) by mouth every morning.  90 tablet  3  . Cholecalciferol (VITAMIN D3) 50000 UNITS CAPS Take 1 capsule by mouth once a week.  12 capsule  3  . furosemide (LASIX) 20 MG tablet Take 1 tablet (20 mg total) by mouth 2 (two) times daily.  180 tablet  3  . gabapentin (NEURONTIN) 100 MG capsule Take 4 capsules (400 mg total) by mouth daily.  360 capsule  3  . glipiZIDE (GLUCOTROL) 10 MG tablet Take one tablet by mouth two times daily.  180 tablet  3  . glucose blood test strip 1 each by Other route as needed for other (Contour lancets). Use as  instructed      . Multiple Vitamin (MULTIVITAMIN WITH MINERALS) TABS Take 1 tablet by mouth daily.      . sildenafil (VIAGRA) 100 MG tablet Take 100 mg by mouth daily as needed for erectile dysfunction.      . sucralfate (CARAFATE) 1 G tablet Take 1 tablet (1 g total) by mouth 4 (four) times daily.  180 tablet  3  . zolpidem (AMBIEN) 10 MG tablet Take one tablet once daily at bedtime for rest  30 tablet  2   No current facility-administered medications on file prior to visit.    BP 124/78  Pulse 81  Temp(Src) 97.8 F (36.6 C) (Oral)  Resp 18  Ht 5\' 10"  (1.778 m)  Wt 230 lb (104.327 kg)  BMI 33 kg/m2  SpO2 99%  Physical exam  Constitutional: He is oriented to person, place, and time. He appears well-developed and well-nourished. No distress.  HENT:   Head: Normocephalic and atraumatic.  Right Ear: External ear normal.  Left Ear: External ear normal.   Nose: Nose normal.   Mouth/Throat: Oropharynx is clear and moist. No oropharyngeal exudate.  Eyes: Conjunctivae are normal. Pupils are equal, round, and reactive to light.  Neck: Normal range of motion. Neck supple. No JVD present. No tracheal deviation present. No thyromegaly present.  Cardiovascular: Normal rate, regular rhythm, normal heart sounds and intact distal pulses.    No murmur heard. No carotid bruit  Pulmonary/Chest: Effort normal and breath sounds normal. No respiratory distress. He has no wheezes. He has no rales. He exhibits no tenderness.  Abdominal: Soft. Bowel sounds are normal. He exhibits no distension and no mass. There is no tenderness. There is no rebound and no guarding.  Musculoskeletal: Normal range of motion. He exhibits edema. He exhibits no tenderness.  Left 4th toe amputated, scar tissue well healed, right toe 2 amputated, has c3-c6 fusion  Lymphadenopathy:   He has no cervical adenopathy.  Neurological: He is alert and oriented to person, place, and time. He has normal reflexes.  Decreased  microfilament pinprick sensation upto below knee bilaterally, vibration intact  Skin: Skin is warm and dry. No rash noted. He is not diaphoretic. No erythema. No pallor.  Psychiatric: He has a normal mood and affect. His behavior is normal. Judgment and thought content normal.    LABS REVIEWED 03/07/12- Hb 11.9, hct 37.1, wbc 9.1, plt 281 Bun 54, cr 2.14, gfr 34, na 140, k 5.9, glu 71, cl 105, co2 23, ca 9.6, lft wnl, a1c 6.6, t.chol 298, tg 311, hdl 47, ldl 189, urine microalbumin 2329.2, vitamin d 13.9  05/10/12 ekg- normal sinus rhythm, no peaked t waves, no acute st-t wave changes, no old ekg to compare with  CBC    Component Value Date/Time   WBC 11.2* 09/15/2012 1031   WBC 11.7* 02/01/2012 1340   RBC 3.71* 09/15/2012 1031   RBC 3.86* 02/01/2012 1340   HGB 10.6* 09/15/2012 1031   HCT 31.5* 09/15/2012  1031   PLT 246 02/01/2012 1340   MCV 85 09/15/2012 1031   MCH 28.6 09/15/2012 1031   MCH 27.2 02/01/2012 1340   MCHC 33.7 09/15/2012 1031   MCHC 31.6 02/01/2012 1340   RDW 14.3 09/15/2012 1031   RDW 13.9 02/01/2012 1340   LYMPHSABS 3.6* 09/15/2012 1031   LYMPHSABS 3.9 02/01/2012 1340   MONOABS 1.2* 02/01/2012 1340   EOSABS 0.4 09/15/2012 1031   EOSABS 1.4* 02/01/2012 1340   BASOSABS 0.1 09/15/2012 1031   BASOSABS 0.1 02/01/2012 1340    CMP     Component Value Date/Time   NA 140 09/15/2012 1030   NA 138 02/01/2012 1340   K 5.2 09/15/2012 1030   CL 102 09/15/2012 1030   CO2 20 09/15/2012 1030   GLUCOSE 76 09/15/2012 1030   GLUCOSE 158* 02/01/2012 1340   BUN 62* 09/15/2012 1030   BUN 54* 02/01/2012 1340   CREATININE 3.48* 09/15/2012 1030   CALCIUM 9.4 09/15/2012 1030   PROT 6.6 09/15/2012 1030   PROT 7.0 12/25/2011 0500   ALBUMIN 1.9* 12/25/2011 0500   AST 14 09/15/2012 1030   ALT 12 09/15/2012 1030   ALKPHOS 109 09/15/2012 1030   BILITOT 0.2 09/15/2012 1030   GFRNONAA 19* 09/15/2012 1030   GFRAA 22* 09/15/2012 1030   Lipid Panel     Component Value Date/Time   TRIG 377* 09/15/2012 1030   HDL 40 09/15/2012 1030   CHOLHDL  5.7* 09/15/2012 1030   LDLCALC 113* 09/15/2012 1030   09/15/12 a1c 6.9, PSA normal  Assessment/plan  ckd stage 4- plan is for dialysis. Patient has follow up with vascular surgery for AV fistula placement and then renal to start dialysis  Hypertension- bp well controlled this visit. Continue amlodipine, atenolol for now  Hyperlipidemia- continue lipitor for now  Peripheral neuropathy- stable at present, continue gabapentin 400 mg daily for now  Depression- with ongoing health issues and now the dialysis, pt feels overwhelmed. On amitriptyline, continue this. counselled him about the benefits of this, common expected side effects.  Edema- has venous stasis along with fluid overload from renal impairment. Continue lasix 20 mg bid for now  Insomnia- continue ambien for now and monitor  Esophageal ulcer- continue sucralfate, symptoms currently stab;e  Dm type 2- continue glipizide for now. a1c 6.9 this visit. Has microalbuminuria and peripheral neuropathy. uptodate with eye exam. Continue asa and statin. Not on ACEI/ARB due to poor renal function.   Anemia of chronic disease- has low hb/hct likely from chronic htn and renal disease with DM. Will monitor cbc for now

## 2012-09-25 ENCOUNTER — Ambulatory Visit: Payer: Medicare Other | Admitting: Surgery

## 2012-09-29 ENCOUNTER — Encounter: Payer: Self-pay | Admitting: Surgery

## 2012-10-02 ENCOUNTER — Ambulatory Visit (INDEPENDENT_AMBULATORY_CARE_PROVIDER_SITE_OTHER): Payer: Medicare HMO | Admitting: Surgery

## 2012-10-02 ENCOUNTER — Encounter (INDEPENDENT_AMBULATORY_CARE_PROVIDER_SITE_OTHER): Payer: Medicare HMO | Admitting: *Deleted

## 2012-10-02 ENCOUNTER — Encounter: Payer: Self-pay | Admitting: Surgery

## 2012-10-02 VITALS — BP 127/81 | HR 79 | Resp 16 | Ht 71.0 in | Wt 229.6 lb

## 2012-10-02 DIAGNOSIS — Z01810 Encounter for preprocedural cardiovascular examination: Secondary | ICD-10-CM

## 2012-10-02 DIAGNOSIS — N186 End stage renal disease: Secondary | ICD-10-CM

## 2012-10-02 NOTE — Progress Notes (Signed)
[facility]   Patient name: Matthew Kline MRN: [id number] DOB: [birth year] Sex: male   Referred by: Dr. Justin Mend  Reason for referral:  Chief Complaint  Patient presents with  . Follow-up    AVF placement  referred by Dr. Justin Mend    HISTORY OF PRESENT ILLNESS: The patient is referred today for evaluation for dialysis access. He is not yet on dialysis. This is a right-handed gentleman whose renal failure secondary to diabetes. He also suffers from hypertension for which he takes a beta blocker. His hypercholesterolemia is managed with a statin. He has no history of coronary artery disease.  Past Medical History  Diagnosis Date  . Polymyalgia rheumatica   . Hypertension   . Hyperlipidemia   . Diabetic neuropathy   . Osteomyelitis of foot, left, acute   . Anxiety   . Insomnia, unspecified   . Unspecified vitamin D deficiency   . Anemia, unspecified   . Other chronic postoperative pain   . Unspecified hereditary and idiopathic peripheral neuropathy   . CHF (congestive heart failure)   . Allergy   . Chronic kidney disease   . Unspecified osteomyelitis, site unspecified   . Long term (current) use of anticoagulants   . 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 Ulcer     diabetic foot   . GERD (gastroesophageal reflux disease) 01/27/2009  . Diabetes mellitus 01/27/2009  . Arthralgia 2010    polyarticular  . CHF (congestive heart failure) 07/25/2009  . Hemorrhoids, internal 10/2011    small    Past Surgical History  Procedure Laterality Date  . Amputation  01/21/2012    Procedure: AMPUTATION RAY;  Surgeon: [name], MD;  Location: [place];  Service: Orthopedics;  Laterality: Left;  Left Foot 4th Ray Amputation  . Cervical discectomy  01/2011  . Arthroscopic knee  surgery Left 08-25-2012  . Amputation right foot digits 1-4      History   Social History  . Marital Status: Single    Spouse Name: N/A    Number of Children: N/A  .  Years of Education: N/A   Occupational History  . Not on file.   Social History Main Topics  . Smoking status: Former Smoker -- 2.00 packs/day for 15 years    Types: Cigarettes    Quit date: 12/21/2011  . Smokeless tobacco: Current User  . Alcohol Use: No  . Drug Use: No  . Sexual Activity: Not on file   Other Topics Concern  . Not on file   Social History Narrative  . No narrative on file    Family History  Problem Relation Age of Onset  . Hypertension Mother   . Cancer Mother 26    Ovarian  . Heart disease Maternal Aunt   . Stroke Maternal Grandfather     Allergies as of 10/02/2012 - Review Complete 10/02/2012  Allergen Reaction Noted  . Morphine and related  02/01/2012  . Tygacil [tigecycline] Nausea And Vomiting 09/22/2011    Current Outpatient Prescriptions on File Prior to Visit  Medication Sig Dispense Refill  . amitriptyline (ELAVIL) 50 MG tablet Take 1 tablet (50 mg total) by mouth at bedtime.  90 tablet  3  . amLODipine (NORVASC) 2.5 MG tablet Take 1 tablet (2.5 mg total) by mouth daily.  90 tablet  3  . atenolol (TENORMIN) 25 MG tablet Take 1 tablet (25 mg total) by mouth every morning.  90 tablet  3  .  atorvastatin (LIPITOR) 40 MG tablet Take 1 tablet (40 mg total) by mouth daily.  90 tablet  3  . Cholecalciferol (VITAMIN D3) 50000 UNITS CAPS Take 1 capsule by mouth once a week.  12 capsule  3  . furosemide (LASIX) 20 MG tablet Take 1 tablet (20 mg total) by mouth 2 (two) times daily.  180 tablet  3  . gabapentin (NEURONTIN) 100 MG capsule Take 4 capsules (400 mg total) by mouth daily.  360 capsule  3  . glipiZIDE (GLUCOTROL) 10 MG tablet Take one tablet by mouth two times daily.  180 tablet  3  . glucose blood test strip 1 each by Other route as needed for other (Contour lancets). Use as instructed      . Multiple Vitamin (MULTIVITAMIN WITH MINERALS) TABS Take 1 tablet by mouth daily.      . sildenafil (VIAGRA) 100 MG tablet Take 100 mg by mouth daily as  needed for erectile dysfunction.      . sucralfate (CARAFATE) 1 G tablet Take 1 tablet (1 g total) by mouth 4 (four) times daily.  180 tablet  3  . zolpidem (AMBIEN) 10 MG tablet Take one tablet once daily at bedtime for rest  30 tablet  2   No current facility-administered medications on file prior to visit.     REVIEW OF SYSTEMS: Cardiovascular: No chest pain, chest pressure, palpitations, orthopnea, or dyspnea on exertion. No claudication or rest pain,  No history of DVT or phlebitis. Pulmonary: No productive cough, asthma or wheezing. Neurologic: No weakness, paresthesias, aphasia, or amaurosis. No dizziness. Hematologic: No bleeding problems or clotting disorders. Musculoskeletal: No joint pain or joint swelling. Gastrointestinal: No blood in stool or hematemesis Genitourinary: No dysuria or hematuria. Psychiatric:: No history of major depression. Integumentary: No rashes or ulcers. Constitutional: No fever or chills.  PHYSICAL EXAMINATION: General: The patient appears their stated age.  Vital signs are BP 127/81  Pulse 79  Resp 16  Ht 5\' 11"  (1.803 m)  Wt 229 lb 9.6 oz (104.146 kg)  BMI 32.04 kg/m2 HEENT:  No gross abnormalities Pulmonary: Respirations are non-labored Abdomen: Soft and non-tender  Musculoskeletal: There are no major deformities.   Neurologic: No focal weakness or paresthesias are detected, Skin: There are no ulcer or rashes noted. Psychiatric: The patient has normal affect. Cardiovascular: There is a regular rate and rhythm without significant murmur appreciated. Palpable left brachial and radial pulse  Diagnostic Studies: Vein mapping was ordered and reviewed. Cephalic vein is not visualized bilaterally. He has an adequate left basilic vein measuring [id number] cm.   Assessment:  Stage IV renal insufficiency Plan: Based on the vein mapping acquired today, the patient will be a candidate for a left basilic vein transposition. I discussed the details of  the procedure as well as the risks and benefits which include but are not limited to the risk of steal syndrome, and the need for future interventions. All his questions were answered. His operation is been scheduled for Thursday, October second     V. Leia Alf, M.D. [facility] [place] Office: [phone] Pager:  [phone]

## 2012-10-04 ENCOUNTER — Other Ambulatory Visit: Payer: Self-pay

## 2012-10-06 ENCOUNTER — Encounter (HOSPITAL_COMMUNITY): Payer: Self-pay | Admitting: Pharmacy Technician

## 2012-10-10 ENCOUNTER — Encounter (HOSPITAL_COMMUNITY)
Admission: RE | Admit: 2012-10-10 | Discharge: 2012-10-10 | Disposition: A | Discharge status: Home / Self Care | Payer: Medicare HMO | Source: Ambulatory Visit | Attending: Anesthesiology | Admitting: Anesthesiology

## 2012-10-10 ENCOUNTER — Encounter (HOSPITAL_COMMUNITY): Payer: Self-pay

## 2012-10-10 ENCOUNTER — Encounter (HOSPITAL_COMMUNITY)
Admission: RE | Admit: 2012-10-10 | Discharge: 2012-10-10 | Disposition: A | Discharge status: Home / Self Care | Payer: Medicare HMO | Source: Ambulatory Visit | Attending: Surgery | Admitting: Surgery

## 2012-10-10 HISTORY — DX: Acute myocardial infarction, unspecified: I21.9

## 2012-10-10 HISTORY — DX: Sleep apnea, unspecified: G47.30

## 2012-10-10 HISTORY — DX: Cerebral infarction, unspecified: I63.9

## 2012-10-10 NOTE — Pre-Procedure Instructions (Signed)
Matthew Kline  10/10/2012   Your procedure is scheduled on:  Thursday, October 2nd.  [facility] "[facility]" at 7:30 AM.  Call this number if you have problems the morning of surgery: [phone]   Remember:   Do not eat food or drink liquids after midnight.   Take these medicines the morning of surgery with A SIP OF WATER: amLODipine (NORVASC), atenolol (TENORMIN),gabapentin (NEURONTIN).     Do not wear jewelry, make-up or nail polish.  Do not wear lotions, powders, or perfumes. You may wear deodorant.  Do not shave 48 hours prior to surgery. Men may shave face and neck.  Do not bring valuables to the hospital.  [facility] is not responsible for any belongings or valuables.               Contacts, dentures or bridgework may not be worn into surgery.  Leave suitcase in the car. After surgery it may be brought to your room.  For patients admitted to the hospital, discharge time is determined by your  treatment team.               Patients discharged the day of surgery will not be allowed to drive home.  Name and phone number of your driver:    Special Instructions: Shower using CHG 2 nights before surgery and the night before surgery.  If you shower the day of surgery use CHG.  Use special wash - you have one bottle of CHG for all showers.  You should use approximately 1/3 of the bottle for each shower.   Please read over the following fact sheets that you were given: Pain Booklet, Coughing and Deep Breathing and Surgical Site Infection Prevention

## 2012-10-11 MED ORDER — DEXTROSE 5 % IV SOLN
1.5000 g | INTRAVENOUS | Status: DC
  Filled 2012-10-11: qty 1.5

## 2012-10-12 ENCOUNTER — Other Ambulatory Visit: Payer: Self-pay | Admitting: *Deleted

## 2012-10-12 ENCOUNTER — Encounter (HOSPITAL_COMMUNITY)
Admission: RE | Disposition: A | Discharge status: Home / Self Care | Payer: Self-pay | Source: Ambulatory Visit | Attending: Surgery

## 2012-10-12 ENCOUNTER — Encounter (HOSPITAL_COMMUNITY): Payer: Self-pay | Admitting: *Deleted

## 2012-10-12 ENCOUNTER — Ambulatory Visit (HOSPITAL_COMMUNITY)
Admission: RE | Admit: 2012-10-12 | Discharge: 2012-10-12 | Disposition: A | Discharge status: Home / Self Care | Payer: Medicare HMO | Source: Ambulatory Visit | Attending: Surgery | Admitting: Surgery

## 2012-10-12 DIAGNOSIS — Z538 Procedure and treatment not carried out for other reasons: Secondary | ICD-10-CM | POA: Insufficient documentation

## 2012-10-12 DIAGNOSIS — Z01810 Encounter for preprocedural cardiovascular examination: Secondary | ICD-10-CM | POA: Insufficient documentation

## 2012-10-12 DIAGNOSIS — Z01812 Encounter for preprocedural laboratory examination: Secondary | ICD-10-CM | POA: Insufficient documentation

## 2012-10-12 LAB — POCT I-STAT 4, (NA,K, GLUC, HGB,HCT)
Glucose, Bld: 90 mg/dL (ref 70–99)
HCT: 30 % — ABNORMAL LOW (ref 39.0–52.0)
Hemoglobin: 10.2 g/dL — ABNORMAL LOW (ref 13.0–17.0)

## 2012-10-12 SURGERY — CANCELLED PROCEDURE
Site: Arm Upper | Laterality: Left

## 2012-10-12 MED ORDER — SODIUM CHLORIDE 0.9 % IV SOLN: INTRAVENOUS | Status: DC

## 2012-10-12 SURGICAL SUPPLY — 29 items
CANISTER SUCTION 2500CC (MISCELLANEOUS) ×2 IMPLANT
CLIP TI MEDIUM 24 (CLIP) ×2 IMPLANT
CLIP TI WIDE RED SMALL 24 (CLIP) ×2 IMPLANT
COVER PROBE W GEL 5X96 (DRAPES) ×2 IMPLANT
COVER SURGICAL LIGHT HANDLE (MISCELLANEOUS) ×2 IMPLANT
DERMABOND ADVANCED (GAUZE/BANDAGES/DRESSINGS) ×1
DERMABOND ADVANCED .7 DNX12 (GAUZE/BANDAGES/DRESSINGS) ×1 IMPLANT
ELECT REM PT RETURN 9FT ADLT (ELECTROSURGICAL) ×2
ELECTRODE REM PT RTRN 9FT ADLT (ELECTROSURGICAL) ×1 IMPLANT
GLOVE BIOGEL PI IND STRL 7.5 (GLOVE) ×1 IMPLANT
GLOVE BIOGEL PI INDICATOR 7.5 (GLOVE) ×1
GLOVE SURG SS PI 7.5 STRL IVOR (GLOVE) ×2 IMPLANT
GOWN PREVENTION PLUS XXLARGE (GOWN DISPOSABLE) ×2 IMPLANT
GOWN STRL NON-REIN LRG LVL3 (GOWN DISPOSABLE) ×6 IMPLANT
HEMOSTAT SNOW SURGICEL 2X4 (HEMOSTASIS) IMPLANT
KIT BASIN OR (CUSTOM PROCEDURE TRAY) ×2 IMPLANT
KIT ROOM TURNOVER OR (KITS) ×2 IMPLANT
NS IRRIG 1000ML POUR BTL (IV SOLUTION) ×2 IMPLANT
PACK CV ACCESS (CUSTOM PROCEDURE TRAY) ×2 IMPLANT
PAD ARMBOARD 7.5X6 YLW CONV (MISCELLANEOUS) ×4 IMPLANT
SUT PROLENE 6 0 CC (SUTURE) ×2 IMPLANT
SUT SILK 2 0 SH (SUTURE) ×2 IMPLANT
SUT VIC AB 3-0 SH 27 (SUTURE) ×1
SUT VIC AB 3-0 SH 27X BRD (SUTURE) ×1 IMPLANT
SUT VICRYL 4-0 PS2 18IN ABS (SUTURE) IMPLANT
TOWEL OR 17X24 6PK STRL BLUE (TOWEL DISPOSABLE) ×2 IMPLANT
TOWEL OR 17X26 10 PK STRL BLUE (TOWEL DISPOSABLE) ×2 IMPLANT
UNDERPAD 30X30 INCONTINENT (UNDERPADS AND DIAPERS) ×2 IMPLANT
WATER STERILE IRR 1000ML POUR (IV SOLUTION) ×2 IMPLANT

## 2012-10-12 NOTE — Anesthesia Preprocedure Evaluation (Addendum)
Anesthesia Evaluation  Patient identified by MRN, date of birth, ID band Patient awake    Reviewed: Allergy & Precautions, H&P , NPO status , Patient's Chart, lab work & pertinent test results, reviewed documented beta blocker date and time   Airway Mallampati: II TM Distance: >3 FB Neck ROM: Full    Dental  (+) Teeth Intact, Dental Advisory Given and Chipped   Pulmonary neg sleep apnea, former smoker,  9-13FINDINGS: The heart size and pulmonary vascularity are normal. There is slight linear scarring or atelectasis at the left lung base. No acute osseous abnormality. No effusions.  -14         Chest x-ray  Sleep Apnea resolved per pt. breath sounds clear to auscultation  Pulmonary exam normal       Cardiovascular Exercise Tolerance: Good hypertension, Pt. on medications and Pt. on home beta blockers + Past MI and +CHF Rhythm:Regular Rate:Normal  12-Oct-2012 08:02:59 [facility]-MC/SS ROUTINE RECORD Normal sinus rhythm Normal ECG No old tracing to compare   Neuro/Psych Anxiety Depression  Neuromuscular disease CVA, No Residual Symptoms    GI/Hepatic GERD-  Medicated and Controlled,  Endo/Other  diabetes, Well Controlled, Type 2  Renal/GU ESRFRenal disease     Musculoskeletal   Abdominal (+) + obese,   Peds  Hematology   Anesthesia Other Findings Lower teeth broken off.   Reproductive/Obstetrics                      Anesthesia Physical Anesthesia Plan  ASA: III  Anesthesia Plan: General   Post-op Pain Management:    Induction: Intravenous  Airway Management Planned: LMA  Additional Equipment:   Intra-op Plan:   Post-operative Plan: Extubation in OR  Informed Consent: I have reviewed the patients History and Physical, chart, labs and discussed the procedure including the risks, benefits and alternatives for the proposed anesthesia with the patient or authorized  representative who has indicated his/her understanding and acceptance.   Dental advisory given  Plan Discussed with: CRNA and Surgeon  Anesthesia Plan Comments: (Pt suffered fall in preop-case will be delayed or cancelled as pt c/o knee pain)      Anesthesia Quick Evaluation

## 2012-10-12 NOTE — Progress Notes (Signed)
OR CASE CANCELLED BY DR. Trula Slade DUE TO PATIENT'S FALL.  PATIENT INSTRUCTED TO F\U WITH ORTHOPEDIC SURGEON AND MAKE HIM AWARE OF FALL.

## 2012-10-18 MED ORDER — DEXTROSE 5 % IV SOLN
1.5000 g | INTRAVENOUS | Status: AC
  Administered 2012-10-19: 1.5 g via INTRAVENOUS
  Filled 2012-10-18: qty 1.5

## 2012-10-18 NOTE — Progress Notes (Signed)
Pt came to the hospital today to get  more CHG- he had used all he had in prep for OR that was resecheduled. I spoke with patient and gave arrival time of 8:30.  Pt said he had no questions and instructions were the same as before.

## 2012-10-19 ENCOUNTER — Encounter (HOSPITAL_COMMUNITY)
Admission: RE | Disposition: A | Discharge status: Home / Self Care | Payer: Self-pay | Source: Ambulatory Visit | Attending: Surgery

## 2012-10-19 ENCOUNTER — Encounter (HOSPITAL_COMMUNITY): Payer: Self-pay | Admitting: *Deleted

## 2012-10-19 ENCOUNTER — Encounter (HOSPITAL_COMMUNITY): Payer: Medicare HMO | Admitting: Anesthesiology

## 2012-10-19 ENCOUNTER — Ambulatory Visit (HOSPITAL_COMMUNITY): Payer: Medicare HMO | Admitting: Anesthesiology

## 2012-10-19 ENCOUNTER — Ambulatory Visit (HOSPITAL_COMMUNITY)
Admission: RE | Admit: 2012-10-19 | Discharge: 2012-10-19 | Disposition: A | Discharge status: Home / Self Care | Payer: Medicare HMO | Source: Ambulatory Visit | Attending: Surgery | Admitting: Surgery

## 2012-10-19 DIAGNOSIS — I129 Hypertensive chronic kidney disease with stage 1 through stage 4 chronic kidney disease, or unspecified chronic kidney disease: Secondary | ICD-10-CM | POA: Insufficient documentation

## 2012-10-19 DIAGNOSIS — E7800 Pure hypercholesterolemia, unspecified: Secondary | ICD-10-CM | POA: Insufficient documentation

## 2012-10-19 DIAGNOSIS — N184 Chronic kidney disease, stage 4 (severe): Secondary | ICD-10-CM | POA: Insufficient documentation

## 2012-10-19 DIAGNOSIS — I509 Heart failure, unspecified: Secondary | ICD-10-CM | POA: Insufficient documentation

## 2012-10-19 DIAGNOSIS — N186 End stage renal disease: Secondary | ICD-10-CM

## 2012-10-19 DIAGNOSIS — Z79899 Other long term (current) drug therapy: Secondary | ICD-10-CM | POA: Insufficient documentation

## 2012-10-19 DIAGNOSIS — E1142 Type 2 diabetes mellitus with diabetic polyneuropathy: Secondary | ICD-10-CM | POA: Insufficient documentation

## 2012-10-19 DIAGNOSIS — E1149 Type 2 diabetes mellitus with other diabetic neurological complication: Secondary | ICD-10-CM | POA: Insufficient documentation

## 2012-10-19 DIAGNOSIS — E1129 Type 2 diabetes mellitus with other diabetic kidney complication: Secondary | ICD-10-CM | POA: Insufficient documentation

## 2012-10-19 HISTORY — PX: BASCILIC VEIN TRANSPOSITION: SHX5742

## 2012-10-19 LAB — GLUCOSE, CAPILLARY: Glucose-Capillary: 93 mg/dL (ref 70–99)

## 2012-10-19 LAB — POCT I-STAT 4, (NA,K, GLUC, HGB,HCT)
Glucose, Bld: 74 mg/dL (ref 70–99)
HCT: 30 % — ABNORMAL LOW (ref 39.0–52.0)
Hemoglobin: 10.2 g/dL — ABNORMAL LOW (ref 13.0–17.0)
Sodium: 141 mEq/L (ref 135–145)

## 2012-10-19 SURGERY — TRANSPOSITION, VEIN, BASILIC
Anesthesia: General | Site: Arm Upper | Laterality: Left | Wound class: Contaminated

## 2012-10-19 MED ORDER — ONDANSETRON HCL 4 MG/2ML IJ SOLN
INTRAMUSCULAR | Status: DC | PRN
  Administered 2012-10-19: 4 mg via INTRAMUSCULAR

## 2012-10-19 MED ORDER — OXYCODONE HCL 5 MG PO TABS: 5.0000 mg | ORAL_TABLET | ORAL | Status: DC | PRN

## 2012-10-19 MED ORDER — HEPARIN SODIUM (PORCINE) 1000 UNIT/ML IJ SOLN
INTRAMUSCULAR | Status: DC | PRN
  Administered 2012-10-19: 5000 [IU] via INTRAVENOUS

## 2012-10-19 MED ORDER — HYDROMORPHONE HCL PF 1 MG/ML IJ SOLN
0.2500 mg | INTRAMUSCULAR | Status: DC | PRN
  Administered 2012-10-19 (×2): 0.5 mg via INTRAVENOUS

## 2012-10-19 MED ORDER — MEPERIDINE HCL 25 MG/ML IJ SOLN: 6.2500 mg | INTRAMUSCULAR | Status: DC | PRN

## 2012-10-19 MED ORDER — LIDOCAINE HCL (CARDIAC) 20 MG/ML IV SOLN
INTRAVENOUS | Status: DC | PRN
  Administered 2012-10-19: 100 mg via INTRAVENOUS

## 2012-10-19 MED ORDER — FENTANYL CITRATE 0.05 MG/ML IJ SOLN
INTRAMUSCULAR | Status: DC | PRN
  Administered 2012-10-19: 100 ug via INTRAVENOUS
  Administered 2012-10-19: 25 ug via INTRAVENOUS
  Administered 2012-10-19: 50 ug via INTRAVENOUS

## 2012-10-19 MED ORDER — DEXTROSE 5 % IV SOLN
10.0000 mg | INTRAVENOUS | Status: DC | PRN
  Administered 2012-10-19: 50 ug/min via INTRAVENOUS

## 2012-10-19 MED ORDER — MIDAZOLAM HCL 5 MG/5ML IJ SOLN
INTRAMUSCULAR | Status: DC | PRN
  Administered 2012-10-19: 2 mg via INTRAVENOUS

## 2012-10-19 MED ORDER — SODIUM CHLORIDE 0.9 % IR SOLN
Status: DC | PRN
  Administered 2012-10-19: 14:00:00

## 2012-10-19 MED ORDER — ONDANSETRON HCL 4 MG/2ML IJ SOLN: 4.0000 mg | Freq: Once | INTRAMUSCULAR | Status: DC | PRN

## 2012-10-19 MED ORDER — 0.9 % SODIUM CHLORIDE (POUR BTL) OPTIME
TOPICAL | Status: DC | PRN
  Administered 2012-10-19: 1000 mL

## 2012-10-19 MED ORDER — OXYCODONE HCL 5 MG PO TABS
5.0000 mg | ORAL_TABLET | Freq: Once | ORAL | Status: AC | PRN
  Administered 2012-10-19: 5 mg via ORAL

## 2012-10-19 MED ORDER — PROPOFOL 10 MG/ML IV BOLUS
INTRAVENOUS | Status: DC | PRN
  Administered 2012-10-19: 150 mg via INTRAVENOUS

## 2012-10-19 MED ORDER — OXYCODONE HCL 5 MG/5ML PO SOLN: 5.0000 mg | Freq: Once | ORAL | Status: AC | PRN

## 2012-10-19 MED ORDER — SODIUM CHLORIDE 0.9 % IV SOLN
INTRAVENOUS | Status: DC | PRN
  Administered 2012-10-19: 13:00:00 via INTRAVENOUS

## 2012-10-19 MED ORDER — DEXTROSE 50 % IV SOLN
12.5000 g | Freq: Once | INTRAVENOUS | Status: DC
  Filled 2012-10-19: qty 50

## 2012-10-19 MED ORDER — SODIUM CHLORIDE 0.9 % IV SOLN
INTRAVENOUS | Status: DC
  Administered 2012-10-19: 11:00:00 via INTRAVENOUS

## 2012-10-19 MED ORDER — PROTAMINE SULFATE 10 MG/ML IV SOLN
INTRAVENOUS | Status: DC | PRN
  Administered 2012-10-19 (×2): 20 mg via INTRAVENOUS
  Administered 2012-10-19: 10 mg via INTRAVENOUS

## 2012-10-19 MED ORDER — DEXTROSE 50 % IV SOLN
INTRAVENOUS | Status: AC
  Administered 2012-10-19: 25 mL
  Filled 2012-10-19: qty 50

## 2012-10-19 MED ORDER — HYDROMORPHONE HCL PF 1 MG/ML IJ SOLN
INTRAMUSCULAR | Status: AC
  Filled 2012-10-19: qty 1

## 2012-10-19 MED ORDER — OXYCODONE HCL 5 MG PO TABS
ORAL_TABLET | ORAL | Status: AC
  Filled 2012-10-19: qty 1

## 2012-10-19 SURGICAL SUPPLY — 36 items
CANISTER SUCTION 2500CC (MISCELLANEOUS) ×2 IMPLANT
CLIP TI MEDIUM 24 (CLIP) ×2 IMPLANT
CLIP TI WIDE RED SMALL 24 (CLIP) ×2 IMPLANT
COVER PROBE W GEL 5X96 (DRAPES) ×2 IMPLANT
COVER SURGICAL LIGHT HANDLE (MISCELLANEOUS) ×2 IMPLANT
DERMABOND ADVANCED (GAUZE/BANDAGES/DRESSINGS) ×1
DERMABOND ADVANCED .7 DNX12 (GAUZE/BANDAGES/DRESSINGS) ×1 IMPLANT
ELECT REM PT RETURN 9FT ADLT (ELECTROSURGICAL) ×2
ELECTRODE REM PT RTRN 9FT ADLT (ELECTROSURGICAL) ×1 IMPLANT
GLOVE BIOGEL PI IND STRL 6.5 (GLOVE) ×3 IMPLANT
GLOVE BIOGEL PI IND STRL 7.0 (GLOVE) ×1 IMPLANT
GLOVE BIOGEL PI IND STRL 7.5 (GLOVE) ×1 IMPLANT
GLOVE BIOGEL PI INDICATOR 6.5 (GLOVE) ×3
GLOVE BIOGEL PI INDICATOR 7.0 (GLOVE) ×1
GLOVE BIOGEL PI INDICATOR 7.5 (GLOVE) ×1
GLOVE SS BIOGEL STRL SZ 6.5 (GLOVE) ×1 IMPLANT
GLOVE SUPERSENSE BIOGEL SZ 6.5 (GLOVE) ×1
GLOVE SURG SS PI 7.5 STRL IVOR (GLOVE) ×2 IMPLANT
GOWN PREVENTION PLUS XLARGE (GOWN DISPOSABLE) ×2 IMPLANT
GOWN PREVENTION PLUS XXLARGE (GOWN DISPOSABLE) ×2 IMPLANT
GOWN STRL NON-REIN LRG LVL3 (GOWN DISPOSABLE) ×6 IMPLANT
HEMOSTAT SNOW SURGICEL 2X4 (HEMOSTASIS) ×2 IMPLANT
KIT BASIN OR (CUSTOM PROCEDURE TRAY) ×2 IMPLANT
KIT ROOM TURNOVER OR (KITS) ×2 IMPLANT
NS IRRIG 1000ML POUR BTL (IV SOLUTION) ×2 IMPLANT
PACK CV ACCESS (CUSTOM PROCEDURE TRAY) ×2 IMPLANT
PAD ARMBOARD 7.5X6 YLW CONV (MISCELLANEOUS) ×4 IMPLANT
SUT PROLENE 6 0 CC (SUTURE) ×10 IMPLANT
SUT SILK 2 0 SH (SUTURE) ×2 IMPLANT
SUT VIC AB 3-0 SH 27 (SUTURE) ×3
SUT VIC AB 3-0 SH 27X BRD (SUTURE) ×3 IMPLANT
SUT VICRYL 4-0 PS2 18IN ABS (SUTURE) ×4 IMPLANT
TOWEL OR 17X24 6PK STRL BLUE (TOWEL DISPOSABLE) ×2 IMPLANT
TOWEL OR 17X26 10 PK STRL BLUE (TOWEL DISPOSABLE) ×2 IMPLANT
UNDERPAD 30X30 INCONTINENT (UNDERPADS AND DIAPERS) ×2 IMPLANT
WATER STERILE IRR 1000ML POUR (IV SOLUTION) ×2 IMPLANT

## 2012-10-19 NOTE — Transfer of Care (Signed)
Immediate Anesthesia Transfer of Care Note  Patient: Matthew Kline  Procedure(s) Performed: Procedure(s): Fowler (Left)  Patient Location: PACU  Anesthesia Type:General  Level of Consciousness: awake, alert , oriented and patient cooperative  Airway & Oxygen Therapy: Patient Spontanous Breathing and Patient connected to nasal cannula oxygen  Post-op Assessment: Report given to PACU RN and Post -op Vital signs reviewed and stable  Post vital signs: Reviewed  Complications: No apparent anesthesia complications

## 2012-10-19 NOTE — H&P (View-Only) (Signed)
[facility]   Patient name: Matthew Kline MRN: [id number] DOB: 1960-07-09 Sex: male   Referred by: Dr. Justin Mend  Reason for referral:  Chief Complaint  Patient presents with  . Follow-up    AVF placement  referred by Dr. Justin Mend    HISTORY OF PRESENT ILLNESS: The patient is referred today for evaluation for dialysis access. He is not yet on dialysis. This is a right-handed gentleman whose renal failure secondary to diabetes. He also suffers from hypertension for which he takes a beta blocker. His hypercholesterolemia is managed with a statin. He has no history of coronary artery disease.  Past Medical History  Diagnosis Date  . Polymyalgia rheumatica   . Hypertension   . Hyperlipidemia   . Diabetic neuropathy   . Osteomyelitis of foot, left, acute   . Anxiety   . Insomnia, unspecified   . Unspecified vitamin D deficiency   . Anemia, unspecified   . Other chronic postoperative pain   . Unspecified hereditary and idiopathic peripheral neuropathy   . CHF (congestive heart failure)   . Allergy   . Chronic kidney disease   . Unspecified osteomyelitis, site unspecified   . Long term (current) use of anticoagulants   . 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 Ulcer     diabetic foot   . GERD (gastroesophageal reflux disease) 01/27/2009  . Diabetes mellitus 01/27/2009  . Arthralgia 2010    polyarticular  . CHF (congestive heart failure) 07/25/2009  . Hemorrhoids, internal 10/2011    small    Past Surgical History  Procedure Laterality Date  . Amputation  01/21/2012    Procedure: AMPUTATION RAY;  Surgeon: [name], MD;  Location: [facility];  Service: Orthopedics;  Laterality: Left;  Left Foot 4th Ray Amputation  . Cervical discectomy  01/2011  . Arthroscopic knee  surgery Left 08-25-2012  . Amputation right foot digits 1-4      History   Social History  . Marital Status: Single    Spouse Name: N/A    Number of Children: N/A  .  Years of Education: N/A   Occupational History  . Not on file.   Social History Main Topics  . Smoking status: Former Smoker -- 2.00 packs/day for 15 years    Types: Cigarettes    Quit date: 12/21/2011  . Smokeless tobacco: Current User  . Alcohol Use: No  . Drug Use: No  . Sexual Activity: Not on file   Other Topics Concern  . Not on file   Social History Narrative  . No narrative on file    Family History  Problem Relation Age of Onset  . Hypertension Mother   . Cancer Mother 75    Ovarian  . Heart disease Maternal Aunt   . Stroke Maternal Grandfather     Allergies as of 10/02/2012 - Review Complete 10/02/2012  Allergen Reaction Noted  . Morphine and related  02/01/2012  . Tygacil [tigecycline] Nausea And Vomiting 09/22/2011    Current Outpatient Prescriptions on File Prior to Visit  Medication Sig Dispense Refill  . amitriptyline (ELAVIL) 50 MG tablet Take 1 tablet (50 mg total) by mouth at bedtime.  90 tablet  3  . amLODipine (NORVASC) 2.5 MG tablet Take 1 tablet (2.5 mg total) by mouth daily.  90 tablet  3  . atenolol (TENORMIN) 25 MG tablet Take 1 tablet (25 mg total) by mouth every morning.  90 tablet  3  .  atorvastatin (LIPITOR) 40 MG tablet Take 1 tablet (40 mg total) by mouth daily.  90 tablet  3  . Cholecalciferol (VITAMIN D3) 50000 UNITS CAPS Take 1 capsule by mouth once a week.  12 capsule  3  . furosemide (LASIX) 20 MG tablet Take 1 tablet (20 mg total) by mouth 2 (two) times daily.  180 tablet  3  . gabapentin (NEURONTIN) 100 MG capsule Take 4 capsules (400 mg total) by mouth daily.  360 capsule  3  . glipiZIDE (GLUCOTROL) 10 MG tablet Take one tablet by mouth two times daily.  180 tablet  3  . glucose blood test strip 1 each by Other route as needed for other (Contour lancets). Use as instructed      . Multiple Vitamin (MULTIVITAMIN WITH MINERALS) TABS Take 1 tablet by mouth daily.      . sildenafil (VIAGRA) 100 MG tablet Take 100 mg by mouth daily as  needed for erectile dysfunction.      . sucralfate (CARAFATE) 1 G tablet Take 1 tablet (1 g total) by mouth 4 (four) times daily.  180 tablet  3  . zolpidem (AMBIEN) 10 MG tablet Take one tablet once daily at bedtime for rest  30 tablet  2   No current facility-administered medications on file prior to visit.     REVIEW OF SYSTEMS: Cardiovascular: No chest pain, chest pressure, palpitations, orthopnea, or dyspnea on exertion. No claudication or rest pain,  No history of DVT or phlebitis. Pulmonary: No productive cough, asthma or wheezing. Neurologic: No weakness, paresthesias, aphasia, or amaurosis. No dizziness. Hematologic: No bleeding problems or clotting disorders. Musculoskeletal: No joint pain or joint swelling. Gastrointestinal: No blood in stool or hematemesis Genitourinary: No dysuria or hematuria. Psychiatric:: No history of major depression. Integumentary: No rashes or ulcers. Constitutional: No fever or chills.  PHYSICAL EXAMINATION: General: The patient appears their stated age.  Vital signs are BP 127/81  Pulse 79  Resp 16  Ht 5\' 11"  (1.803 m)  Wt 229 lb 9.6 oz (104.146 kg)  BMI 32.04 kg/m2 HEENT:  No gross abnormalities Pulmonary: Respirations are non-labored Abdomen: Soft and non-tender  Musculoskeletal: There are no major deformities.   Neurologic: No focal weakness or paresthesias are detected, Skin: There are no ulcer or rashes noted. Psychiatric: The patient has normal affect. Cardiovascular: There is a regular rate and rhythm without significant murmur appreciated. Palpable left brachial and radial pulse  Diagnostic Studies: Vein mapping was ordered and reviewed. Cephalic vein is not visualized bilaterally. He has an adequate left basilic vein measuring [id number] cm.   Assessment:  Stage IV renal insufficiency Plan: Based on the vein mapping acquired today, the patient will be a candidate for a left basilic vein transposition. I discussed the details of  the procedure as well as the risks and benefits which include but are not limited to the risk of steal syndrome, and the need for future interventions. All his questions were answered. His operation is been scheduled for Thursday, October second     V. Leia Alf, M.D. [facility] [place] Office: [phone] Pager:  [phone]

## 2012-10-19 NOTE — Preoperative (Signed)
Beta Blockers   Tenormin 25 mgs @ 07:15 on 10-19-12

## 2012-10-19 NOTE — Progress Notes (Signed)
CBG results of 70 reported to Dr. Conrad [place]. Orders given via telephone to give pt. 1/2 amp Dextrose IV  To pt.

## 2012-10-19 NOTE — Anesthesia Procedure Notes (Signed)
Procedure Name: LMA Insertion Date/Time: 10/19/2012 1:36 PM Performed by: Wanita Chamberlain Pre-anesthesia Checklist: Patient identified, Timeout performed, Emergency Drugs available, Suction available and Patient being monitored Patient Re-evaluated:Patient Re-evaluated prior to inductionOxygen Delivery Method: Circle system utilized Preoxygenation: Pre-oxygenation with 100% oxygen Intubation Type: IV induction Ventilation: Mask ventilation without difficulty LMA: LMA inserted LMA Size: 5.0 Number of attempts: 1 Tube secured with: Tape Dental Injury: Teeth and Oropharynx as per pre-operative assessment

## 2012-10-19 NOTE — Progress Notes (Signed)
Pt says left fingers feel "pretty numb". They are quite cool to touch, strong HG, 2+ palpable L radial pulse. Dr Trula Slade and PA Rollene Fare at bedside and aware--no new orders, will cont. to monitor. Pt not on HD yet. MD aware potassium is 5.3---no new orders.

## 2012-10-19 NOTE — Op Note (Signed)
[facility] Special[facility]  Patient name: Kavious Schrom MRN: [id number] DOB: [birth year] Sex: male  10/19/2012 Pre-operative Diagnosis: Chronic renal insufficiency, stage IV Post-operative diagnosis:  Same Surgeon:  Eldridge Abrahams Assistants: R. zRoczniak Procedure:   Left basilic vein transposition Anesthesia:  Gen. Blood Loss:  See anesthesia record Specimens:  None  Findings:  Excellent caliber basilic vein, distended to 5 mm  Indications:  The patient is here today for dialysis access.  He is right-handed.  He is not yet on dialysis.  Procedure:  The patient was identified in the holding area and taken to [place] [facility]  The patient was then placed supine on the table. general anesthesia was administered.  The patient was prepped and draped in the usual sterile fashion.  A time out was called and antibiotics were administered.  Preoperative ultrasound identified an adequate basilic vein.  This was traced with ultrasound and marked for orientation.  The vein was widely patent and easily compressible.  I initially made a longitudinal incision at the level of antecubital crease.  Through this incision the basilic vein was identified and harvested.  Side branches were ligated between silk ties.  Through this incision, I also dissected out the brachial artery.  This was approximately a 2.5-3 mm artery without significant calcification.  I then proceeded with full harvesting of the basilic vein through 2 additional skin incisions up to the axilla.  All branches were ligated between silk ties.  The cutaneous nerve coursing with the basilic vein was fully protected.  Next, the vein was marked with a pen for orientation.  It was ligated distally.  The distal limb was ligated with 2-0 silk tie.  The vein was then distended.  Distended nicely.  A subcutaneous tunnel was marked and then created.  The vein was brought through the tunnel, making sure to maintain proper orientation.   The patient was fully heparinized.  After the heparin circulated the brachial artery was occluded.  A number limited to make an arteriotomy which was extended longitudinally with Potts scissors.  The vein was cut to appropriate length and then spatulated.  An end to side anastomosis was created running 6-0 Prolene.  Prior to completion, the profundus maneuvers were performed.  One repair stitch was required for hemostasis.  Initially, there was no significant Doppler signal in the radial artery with the fistula open, however with time and slight narrowing of the arteriovenous anastomosis the patient regained a palpable radial pulse.  50 mg of protamine was administered.  Hemostasis was satisfactory the incisions were closed with 2 layers of 3-0 Vicryl.  Dermabond applied.  There is no complications   Disposition:  To PACU in stable condition.   Theotis Burrow, M.D. [facility] [place] Office: [phone] Pager:  [phone]

## 2012-10-19 NOTE — Interval H&P Note (Signed)
History and Physical Interval Note:  10/19/2012 10:20 AM  Matthew Kline  has presented today for surgery, with the diagnosis of ESRD  The various methods of treatment have been discussed with the patient and family. After consideration of risks, benefits and other options for treatment, the patient has consented to  Procedure(s): [place] (Left) as a surgical intervention .  The patient's history has been reviewed, patient examined, no change in status, stable for surgery.  I have reviewed the patient's chart and labs.  Questions were answered to the patient's satisfaction.     Ranjit Ashurst IV, V. WELLS

## 2012-10-19 NOTE — Anesthesia Postprocedure Evaluation (Signed)
  Anesthesia Post-op Note  Patient: Matthew Kline  Procedure(s) Performed: Procedure(s): BASCILIC VEIN TRANSPOSITION (Left)  Patient Location: PACU  Anesthesia Type:General  Level of Consciousness: awake, alert , oriented and patient cooperative  Airway and Oxygen Therapy: Patient Spontanous Breathing  Post-op Pain: mild  Post-op Assessment: Post-op Vital signs reviewed, Patient's Cardiovascular Status Stable, Respiratory Function Stable, Patent Airway, No signs of Nausea or vomiting and Pain level controlled  Post-op Vital Signs: Reviewed and stable  Complications: No apparent anesthesia complications

## 2012-10-20 ENCOUNTER — Encounter (HOSPITAL_COMMUNITY): Payer: Self-pay | Admitting: Surgery

## 2012-10-23 ENCOUNTER — Telehealth: Payer: Self-pay

## 2012-10-23 NOTE — Telephone Encounter (Signed)
Phone call from pt. to report intermittent numbness in 3 fingers of left hand, and pain in left elbow area.  States the numbness involves the thumb, index and middle fingers.  Regarding pain: reports the pain in fairly constant, and the pain medication "helps some."   Reports "a little swelling and warmth above the (L) elbow."  Denies redness.  Advised that the numbness of the three fingers can be related to nerve irritation from the surgery.  Encouraged to give it more time to resolve; to elevate the left arm above level of heart to minimize swelling, and to do active ROM of the fingers of the left hand.   Advised of signs/ symptoms of infection; ie increased inflammation/ redness/ warmth at surg. site, increased incisional discomfort, fever/ chills, or yellow/ green drainage.  Encouraged to monitor for infection, and call office with worsening symptoms, or if any other concerns.  Verb. Understanding.

## 2012-10-24 ENCOUNTER — Ambulatory Visit: Payer: Medicare HMO

## 2012-11-01 ENCOUNTER — Ambulatory Visit: Payer: Medicare HMO

## 2012-11-01 DIAGNOSIS — Z23 Encounter for immunization: Secondary | ICD-10-CM

## 2012-11-03 ENCOUNTER — Encounter: Payer: Self-pay | Admitting: Surgery

## 2012-11-06 ENCOUNTER — Encounter: Payer: Self-pay | Admitting: Surgery

## 2012-11-06 ENCOUNTER — Ambulatory Visit (INDEPENDENT_AMBULATORY_CARE_PROVIDER_SITE_OTHER): Payer: Commercial Managed Care - HMO | Admitting: Surgery

## 2012-11-06 VITALS — BP 163/90 | HR 100 | Wt 239.0 lb

## 2012-11-06 DIAGNOSIS — N186 End stage renal disease: Secondary | ICD-10-CM

## 2012-11-06 NOTE — Progress Notes (Signed)
The patient comes in today for followup.  He is status post left basilic vein transposition on 10/19/2012.  He is not yet on dialysis.  I brought him back today to evaluate him for possible steal.  At the end of the case, I was not completely comfortable with this approach was handed however this did improve with time.  Today, he reports no symptoms of steal.  Specifically he does not have any numbness in his hand.  He has good grip strength.  His hand is warm.  He does complain of some swelling in the left arm.  Physical examination: Excellent thrill within the basilic vein transposition.  Mild edema to the left arm.  Incisions are healing nicely.  Overall the patient is doing very well.  There is no evidence of steal syndrome.  He does have a history of 3 PICC lines in the past, and with swelling in his left arm there is the possibility of a more central stenosis.  Because the swelling is not as significant in because he is so recent from his operation, I have recommended a repeat evaluation in 6 weeks.  He continues to have left arm swelling he may benefit from a fistulogram and intervention if necessary.

## 2012-11-14 ENCOUNTER — Telehealth: Payer: Self-pay | Admitting: *Deleted

## 2012-11-14 ENCOUNTER — Ambulatory Visit (INDEPENDENT_AMBULATORY_CARE_PROVIDER_SITE_OTHER): Payer: Commercial Managed Care - HMO | Admitting: Internal Medicine

## 2012-11-14 ENCOUNTER — Encounter: Payer: Self-pay | Admitting: Internal Medicine

## 2012-11-14 VITALS — BP 160/84 | HR 69 | Temp 98.0°F | Wt 237.8 lb

## 2012-11-14 DIAGNOSIS — I10 Essential (primary) hypertension: Secondary | ICD-10-CM

## 2012-11-14 DIAGNOSIS — R600 Localized edema: Secondary | ICD-10-CM | POA: Insufficient documentation

## 2012-11-14 DIAGNOSIS — H60391 Other infective otitis externa, right ear: Secondary | ICD-10-CM

## 2012-11-14 DIAGNOSIS — E1149 Type 2 diabetes mellitus with other diabetic neurological complication: Secondary | ICD-10-CM

## 2012-11-14 DIAGNOSIS — H60399 Other infective otitis externa, unspecified ear: Secondary | ICD-10-CM | POA: Insufficient documentation

## 2012-11-14 DIAGNOSIS — R609 Edema, unspecified: Secondary | ICD-10-CM

## 2012-11-14 MED ORDER — CIPROFLOXACIN-HYDROCORTISONE 0.2-1 % OT SUSP: 3.0000 [drp] | Freq: Two times a day (BID) | OTIC | Status: DC

## 2012-11-14 MED ORDER — TORSEMIDE 20 MG PO TABS: 20.0000 mg | ORAL_TABLET | Freq: Every day | ORAL | Status: DC

## 2012-11-14 NOTE — Telephone Encounter (Signed)
Pt scheduled @ 3:00 pm with [facility]

## 2012-11-14 NOTE — Progress Notes (Signed)
[name], male   DOB: November 02, 1960, 52 y.o.   MRN: [id number]  Chief Complaint  Patient presents with  . Acute Visit    swelling in the LT foot/leg x 2 weeks  . Otalgia    pain in the RT ear and side of RT head 3 months off/on with some ringing   Allergies  Allergen Reactions  . Morphine And Related     hallucinations  . Tygacil [Tigecycline] Nausea And Vomiting   HPI 52 y/o male patient here for acute visit Home bp well controlled with sbp 120-130 and dbp 70-80. Seeing renal end of this month  Has not started dialysis yet Av fistula placed on left arm He has had worsening of swelling in his legs-more in left leg for 2 weeks now. He is very uncomfortable. Denies any change in color or warmth of the legs. Denies any drainage from his legs. Denies any open sore in his legs. Has been having muscle cramp in his feet Denies any nausea or vomiting  He has pain in his right ear on and off for sometime but now is experiencing sharp pain in his right ear canal and inner ear along with some headache. denies any URI symptoms. Has occassional ringing in her ears. No drainage reported  Review of Systems  Constitutional: Negative for fever, chills, weight loss, malaise/fatigue and diaphoresis.  HENT: Positive for ear pain and tinnitus. Negative for congestion, ear discharge, hearing loss, nosebleeds and sore throat.   Eyes: Negative for blurred vision, double vision and photophobia.  Respiratory: Negative for cough, shortness of breath and stridor.   Cardiovascular: Positive for leg swelling. Negative for chest pain, palpitations, orthopnea and claudication.  Gastrointestinal: Negative for heartburn, nausea, vomiting and abdominal pain.  Skin: Negative for itching and rash.  Neurological: Negative for dizziness, focal weakness, seizures, loss of consciousness, weakness and headaches.   Past Medical History  Diagnosis Date  . Polymyalgia rheumatica   . Hypertension   .  Hyperlipidemia   . Diabetic neuropathy   . Osteomyelitis of foot, left, acute   . Anxiety   . Insomnia, unspecified   . Unspecified vitamin D deficiency   . Anemia, unspecified   . Other chronic postoperative pain   . Unspecified hereditary and idiopathic peripheral neuropathy   . CHF (congestive heart failure)   . Allergy   . Unspecified osteomyelitis, site unspecified   . Long term (current) use of anticoagulants   . 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 Ulcer     diabetic foot   . GERD (gastroesophageal reflux disease) 01/27/2009  . Diabetes mellitus 01/27/2009  . Arthralgia 2010    polyarticular  . CHF (congestive heart failure) 07/25/2009  . Hemorrhoids, internal 10/2011    small  . Myocardial infarction     mild 1995  . Stroke 01/10/06    no residual at this ti,e  . Sleep apnea     hx of, had surgery  . Chronic kidney disease     Followed by Dr Justin Mend   Medication reviewed. See [facility]  Physical exam  BP 160/84  Pulse 69  Temp(Src) 98 F (36.7 C) (Oral)  Wt 237 lb 12.8 oz (107.865 kg)  SpO2 99%  Constitutional: He is oriented to person, place, and time. He appears well-developed and well-nourished. No distress.   HENT:   Head: Normocephalic and atraumatic.  Right Ear: External ear canal is inflamed, normal tympanic membrane, pain on pulling pinna on right  ear  Left Ear: External ear normal.   Nose: Nose normal.   Mouth/Throat: mild Oropharynx erythema. No oropharyngeal exudate.   Eyes: Conjunctivae are normal. Pupils are equal, round, and reactive to light.   Neck: Normal range of motion. Neck supple. No JVD present. No tracheal deviation present. No thyromegaly present.   Cardiovascular: Normal rate, regular rhythm, normal heart sounds and intact distal pulses.    No murmur heard. No carotid bruit   Pulmonary/Chest: Effort normal and breath sounds normal. No respiratory distress. He has no wheezes. He has no rales. He exhibits no tenderness.    Abdominal: Soft. Bowel sounds are normal. He exhibits no distension and no mass. There is no tenderness. There is no rebound and no guarding.  Musculoskeletal: Normal range of motion. He exhibits edema of 1+ inright and 2+ in left leg upto knee, no erythema, non tender  Left 4th toe amputated, scar tissue well healed, right toe 2 amputated, has c3-c6 fusion  Lymphadenopathy:  He has no cervical adenopathy.  Neurological: He is alert and oriented to person, place, and time. He has normal reflexes.  Decreased microfilament pinprick sensation upto below knee bilaterally, vibration intact   Skin: Skin is warm and dry. No rash noted. He is not diaphoretic. No erythema. No pallor.  Psychiatric: He has a normal mood and affect. His behavior is normal. Judgment and thought content normal.   Labs- CBC    Component Value Date/Time   WBC 11.2* 09/15/2012 1031   WBC 11.7* 02/01/2012 1340   RBC 3.71* 09/15/2012 1031   RBC 3.86* 02/01/2012 1340   HGB 10.2* 10/19/2012 1009   HCT 30.0* 10/19/2012 1009   PLT 246 02/01/2012 1340   MCV 85 09/15/2012 1031   MCH 28.6 09/15/2012 1031   MCH 27.2 02/01/2012 1340   MCHC 33.7 09/15/2012 1031   MCHC 31.6 02/01/2012 1340   RDW 14.3 09/15/2012 1031   RDW 13.9 02/01/2012 1340   LYMPHSABS 3.6* 09/15/2012 1031   LYMPHSABS 3.9 02/01/2012 1340   MONOABS 1.2* 02/01/2012 1340   EOSABS 0.4 09/15/2012 1031   EOSABS 1.4* 02/01/2012 1340   BASOSABS 0.1 09/15/2012 1031   BASOSABS 0.1 02/01/2012 1340    CMP     Component Value Date/Time   NA 141 10/19/2012 1009   NA 140 09/15/2012 1030   K 5.3* 10/19/2012 1009   CL 102 09/15/2012 1030   CO2 20 09/15/2012 1030   GLUCOSE 74 10/19/2012 1009   GLUCOSE 76 09/15/2012 1030   BUN 62* 09/15/2012 1030   BUN 54* 02/01/2012 1340   CREATININE 3.48* 09/15/2012 1030   CALCIUM 9.4 09/15/2012 1030   PROT 6.6 09/15/2012 1030   PROT 7.0 12/25/2011 0500   ALBUMIN 1.9* 12/25/2011 0500   AST 14 09/15/2012 1030   ALT 12 09/15/2012 1030   ALKPHOS 109 09/15/2012 1030   BILITOT  0.2 09/15/2012 1030   GFRNONAA 19* 09/15/2012 1030   GFRAA 22* 09/15/2012 1030    Assessment/plan  Otitis externa- will have him on ciprofloxacin otic drop bid in right ear for a week and reassess  Hypertension- Continue amlodipine, atenolol for now and monitor bp  Edema- asymmetrical, will need to rule out DVT for him, will order venous doppler. With his impaired renal function, will stop lasix and have him on torsemide 20 mg daily for now and reassess. Venous statis and fluid overload in setting of his renal impairment are also contributing to this edema  Type 2 dm- has had improved a1c  in recent past, continue glipizide for now with his statin and gabapentin for neuropathic pain

## 2012-11-15 ENCOUNTER — Other Ambulatory Visit: Payer: Self-pay | Admitting: *Deleted

## 2012-11-15 ENCOUNTER — Ambulatory Visit
Admission: RE | Admit: 2012-11-15 | Discharge: 2012-11-15 | Disposition: A | Discharge status: Home / Self Care | Payer: Commercial Managed Care - HMO | Source: Ambulatory Visit | Attending: Internal Medicine | Admitting: Internal Medicine

## 2012-11-15 DIAGNOSIS — R600 Localized edema: Secondary | ICD-10-CM

## 2012-11-15 MED ORDER — BAYER CONTOUR MONITOR W/DEVICE KIT: PACK | Status: DC

## 2012-11-15 MED ORDER — OFLOXACIN 0.3 % OT SOLN: OTIC | Status: DC

## 2012-11-22 ENCOUNTER — Ambulatory Visit (INDEPENDENT_AMBULATORY_CARE_PROVIDER_SITE_OTHER): Payer: Commercial Managed Care - HMO | Admitting: Internal Medicine

## 2012-11-22 ENCOUNTER — Encounter: Payer: Self-pay | Admitting: Internal Medicine

## 2012-11-22 VITALS — BP 130/84 | HR 67 | Temp 98.5°F | Wt 236.6 lb

## 2012-11-22 DIAGNOSIS — J0100 Acute maxillary sinusitis, unspecified: Secondary | ICD-10-CM

## 2012-11-22 DIAGNOSIS — E1129 Type 2 diabetes mellitus with other diabetic kidney complication: Secondary | ICD-10-CM

## 2012-11-22 DIAGNOSIS — R600 Localized edema: Secondary | ICD-10-CM

## 2012-11-22 DIAGNOSIS — R809 Proteinuria, unspecified: Secondary | ICD-10-CM

## 2012-11-22 DIAGNOSIS — I10 Essential (primary) hypertension: Secondary | ICD-10-CM

## 2012-11-22 DIAGNOSIS — R609 Edema, unspecified: Secondary | ICD-10-CM

## 2012-11-22 DIAGNOSIS — IMO0001 Reserved for inherently not codable concepts without codable children: Secondary | ICD-10-CM

## 2012-11-22 MED ORDER — GLIPIZIDE 5 MG PO TABS: 5.0000 mg | ORAL_TABLET | Freq: Two times a day (BID) | ORAL | Status: DC

## 2012-11-22 MED ORDER — TORSEMIDE 20 MG PO TABS: ORAL_TABLET | ORAL | Status: DC

## 2012-11-22 MED ORDER — AMOXICILLIN-POT CLAVULANATE 250-125 MG PO TABS: 1.0000 | ORAL_TABLET | Freq: Two times a day (BID) | ORAL | Status: DC

## 2012-11-22 NOTE — Progress Notes (Signed)
[name], male   DOB: [birth year], 52 y.o.   MRN: [id number]  Chief Complaint  Patient presents with  . Follow-up    1 week f/u  . other    wants RX for [facility].   Allergies  Allergen Reactions  . Morphine And Related     hallucinations  . Tygacil [Tigecycline] Nausea And Vomiting    HPI 52 y/o male patient here for follow up on his leg swelling and diabetes Home bp well controlled  dvt was ruled out. Torsemide has helped edema some cbg was running 80-140 and pt is concerned if this is high and thus went up on his glipizide to 10 mg bid. No recent hypoglycemic episodes Ear pain is somewhat better but discomfort persists and now also has discomfort and congestion in his sinuses and bringing up green phlegm. No runny nose. No sore throat  Review of Systems  Constitutional: Negative for fever, chills, weight loss, malaise/fatigue and diaphoresis.  HENT: Positive for ear pain and tinnitus.  Eyes: Negative for blurred vision, double vision and photophobia.  Respiratory: Negative for shortness of breath and stridor.   Cardiovascular: Positive for leg swelling. Negative for chest pain, palpitations, orthopnea and claudication.  Gastrointestinal: Negative for heartburn, nausea, vomiting and abdominal pain.  Skin: Negative for itching and rash.  Neurological: Negative for dizziness, focal weakness, seizures, loss of consciousness, weakness and headaches.   Past Medical History  Diagnosis Date  . Polymyalgia rheumatica   . Hypertension   . Hyperlipidemia   . Diabetic neuropathy   . Osteomyelitis of foot, left, acute   . Anxiety   . Insomnia, unspecified   . Unspecified vitamin D deficiency   . Anemia, unspecified   . Other chronic postoperative pain   . Unspecified hereditary and idiopathic peripheral neuropathy   . CHF (congestive heart failure)   . Allergy   . Unspecified osteomyelitis, site unspecified   . Long  term (current) use of anticoagulants   . 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 Ulcer     diabetic foot   . GERD (gastroesophageal reflux disease) 01/27/2009  . Diabetes mellitus 01/27/2009  . Arthralgia 2010    polyarticular  . CHF (congestive heart failure) 07/25/2009  . Hemorrhoids, internal 10/2011    small  . Myocardial infarction     mild 1995  . Stroke 01/10/06    no residual at this ti,e  . Sleep apnea     hx of, had surgery  . Chronic kidney disease     Followed by Dr Justin Mend   Past Surgical History  Procedure Laterality Date  . Amputation  01/21/2012    Procedure: AMPUTATION RAY;  Surgeon: [name], MD;  Location: [facility];  Service: Orthopedics;  Laterality: Left;  Left Foot 4th Ray Amputation  . Cervical discectomy  02/2011    Fusion  . Arthroscopic knee  surgery Left 08-25-2012  . Amputation right foot digits 1-4    . Tonsillectomy    . Eye surgery Bilateral     Lazer  . Bascilic vein transposition Left 10/19/2012    Procedure: BASCILIC VEIN TRANSPOSITION;  Surgeon: Serafina Mitchell, MD;  Location: [facility];  Service: Vascular;  Laterality: Left;   Current Outpatient Prescriptions on File Prior to Visit  Medication Sig Dispense Refill  . amitriptyline (ELAVIL) 50 MG tablet Take 50 mg by mouth at bedtime.      Marland Kitchen  amLODipine (NORVASC) 2.5 MG tablet Take 2.5 mg by mouth daily.      Marland Kitchen atenolol (TENORMIN) 25 MG tablet Take 25 mg by mouth every morning.      Marland Kitchen atorvastatin (LIPITOR) 40 MG tablet Take 40 mg by mouth daily.      . Blood Glucose Monitoring Suppl (BAYER CONTOUR MONITOR) W/DEVICE KIT Use as Directed  1 kit  0  . Cholecalciferol (VITAMIN D3) 50000 UNITS CAPS Take 1 capsule by mouth once a week.      . gabapentin (NEURONTIN) 100 MG capsule Take 100 mg by mouth 4 (four) times daily.      . Multiple Vitamin (MULTIVITAMIN WITH MINERALS) TABS Take 1 tablet by mouth daily.      Marland Kitchen ofloxacin (FLOXIN) 0.3 % otic solution Place three drops into right ear twice  daily  5 mL  0  . oxyCODONE (ROXICODONE) 5 MG immediate release tablet Take 1-2 tablets (5-10 mg total) by mouth every 4 (four) hours as needed for pain.  30 tablet  0  . sucralfate (CARAFATE) 1 G tablet Take 1 g by mouth 4 (four) times daily.      Marland Kitchen zolpidem (AMBIEN) 10 MG tablet Take 10 mg by mouth as needed. Take one tablet once daily at bedtime for rest       No current facility-administered medications on file prior to visit.   Physical exam  BP 130/84  Pulse 67  Temp(Src) 98.5 F (36.9 C) (Oral)  Wt 236 lb 9.6 oz (107.321 kg)  SpO2 95%  Constitutional: He is oriented to person, place, and time. He appears well-developed and well-nourished. No distress.   HENT:   Head: Normocephalic and atraumatic.  Right Ear: External ear canal is inflamed, normal tympanic membrane, pain on pulling pinna on right ear  Left Ear: External ear normal.   Nose: Nose normal.  maxillary sinus tenderness present Mouth/Throat: mild Oropharynx erythema. No oropharyngeal exudate.   Neck: Normal range of motion. Neck supple. No JVD present. No tracheal deviation present. No thyromegaly present.   Cardiovascular: Normal rate, regular rhythm, normal heart sounds and intact distal pulses.    No murmur heard. Pulmonary/Chest: Effort normal and breath sounds normal. No respiratory distress. He has no wheezes. He has no rales. He exhibits no tenderness.   Abdominal: Soft. Bowel sounds are normal. He exhibits no distension and no mass. There is no tenderness. There is no rebound and no guarding.  Musculoskeletal: Normal range of motion. He exhibits edema of 1+ in right and 2+ in left leg upto knee, no erythema, non tender  Left 4th toe amputated, scar tissue well healed, right toe 2 amputated, has c3-c6 fusion  Lymphadenopathy:  He has no cervical adenopathy.  Neurological: He is alert and oriented to person, place, and time. He has normal reflexes.  Decreased microfilament pinprick sensation upto below knee  bilaterally, vibration intact  Skin: Skin is warm and dry. No rash noted. He is not diaphoretic. No erythema. No pallor.  Psychiatric: He has a normal mood and affect. His behavior is normal. Judgment and thought content normal.   Labs- Reviewed  Lab Results  Component Value Date   HGBA1C 6.9* 09/15/2012    Assessment/plan  Maxillary sinusitis- will start him on augmentin 250 mg bid given his renal function for 5 days, monitor for fever or worsening symptoms  Hypertension- Continue amlodipine, atenolol for now and monitor bp  Edema- likely from venous stasis and ESRD, will have his torsemide increased to 40  mg daily for now  Type 2 dm- has had improved a1c in recent past,reviewed home cbg, change glipizide to 5 mg bid for now and recheck a1c next visit

## 2012-12-06 ENCOUNTER — Other Ambulatory Visit: Payer: Self-pay

## 2012-12-06 MED ORDER — GLIPIZIDE 5 MG PO TABS: 5.0000 mg | ORAL_TABLET | Freq: Two times a day (BID) | ORAL | Status: DC

## 2012-12-13 ENCOUNTER — Other Ambulatory Visit: Payer: Medicare HMO

## 2012-12-13 DIAGNOSIS — IMO0001 Reserved for inherently not codable concepts without codable children: Secondary | ICD-10-CM

## 2012-12-13 DIAGNOSIS — N184 Chronic kidney disease, stage 4 (severe): Secondary | ICD-10-CM

## 2012-12-13 DIAGNOSIS — I10 Essential (primary) hypertension: Secondary | ICD-10-CM

## 2012-12-14 ENCOUNTER — Other Ambulatory Visit: Payer: Self-pay | Admitting: *Deleted

## 2012-12-14 ENCOUNTER — Other Ambulatory Visit: Payer: Medicare HMO

## 2012-12-14 LAB — CBC WITH DIFFERENTIAL/PLATELET
Basos: 1 %
Eos: 3 %
Eosinophils Absolute: 0.3 10*3/uL (ref 0.0–0.4)
Immature Grans (Abs): 0 10*3/uL (ref 0.0–0.1)
Immature Granulocytes: 0 %
Lymphs: 34 %
MCH: 28.6 pg (ref 26.6–33.0)
Monocytes: 7 %
Neutrophils Relative %: 55 %
RBC: 3.43 x10E6/uL — ABNORMAL LOW (ref 4.14–5.80)
RDW: 14.4 % (ref 12.3–15.4)
WBC: 10.1 10*3/uL (ref 3.4–10.8)

## 2012-12-14 LAB — HEMOGLOBIN A1C
Est. average glucose Bld gHb Est-mCnc: 146 mg/dL
Hgb A1c MFr Bld: 6.7 % — ABNORMAL HIGH (ref 4.8–5.6)

## 2012-12-14 MED ORDER — ZOLPIDEM TARTRATE 10 MG PO TABS: ORAL_TABLET | ORAL | Status: DC

## 2012-12-19 ENCOUNTER — Encounter: Payer: Self-pay | Admitting: Internal Medicine

## 2012-12-19 ENCOUNTER — Ambulatory Visit (INDEPENDENT_AMBULATORY_CARE_PROVIDER_SITE_OTHER): Payer: Commercial Managed Care - HMO | Admitting: Internal Medicine

## 2012-12-19 VITALS — BP 116/72 | HR 92 | Temp 97.8°F | Wt 224.0 lb

## 2012-12-19 DIAGNOSIS — E1143 Type 2 diabetes mellitus with diabetic autonomic (poly)neuropathy: Secondary | ICD-10-CM

## 2012-12-19 DIAGNOSIS — I10 Essential (primary) hypertension: Secondary | ICD-10-CM | POA: Insufficient documentation

## 2012-12-19 DIAGNOSIS — G909 Disorder of the autonomic nervous system, unspecified: Secondary | ICD-10-CM

## 2012-12-19 DIAGNOSIS — E1149 Type 2 diabetes mellitus with other diabetic neurological complication: Secondary | ICD-10-CM

## 2012-12-19 DIAGNOSIS — I739 Peripheral vascular disease, unspecified: Secondary | ICD-10-CM

## 2012-12-19 DIAGNOSIS — N186 End stage renal disease: Secondary | ICD-10-CM

## 2012-12-19 MED ORDER — ZOLPIDEM TARTRATE 10 MG PO TABS: ORAL_TABLET | ORAL | Status: DC

## 2012-12-19 NOTE — Progress Notes (Signed)
[name], male   DOB: Mar 25, 1960, 52 y.o.   MRN: [id number]    Chief Complaint  Patient presents with  . Medical Managment of Chronic Issues    3 month follow-up, discuss labs    Allergies  Allergen Reactions  . Morphine And Related     hallucinations  . Tygacil [Tigecycline] Nausea And Vomiting    HPI 52 y/o male patient with ESRD, DM type 2 , PVD is here for routine visit He has lost 10 lbs intentionally by following diet whole 30 His cbg this am was 71 and average over last 30 days is 112 Denies hypoglycemic episodes He is sleeping well at night. He would like ambien for 90 days supply He is on torsemide for his swelling which is helping and his demadex was increased by renal- Dr Justin Mend to 40 bid He continues to have pain in his left leg- neuropathic pain, knee pain and swelling and mentions has noticed pain more with walking some distance along with cramp like sensation He also has had a fistula made in his left arm  Review of Systems   Constitutional: Negative for fever, chills, weight loss, malaise/fatigue and diaphoresis.   HENT: earache resolved Eyes: Negative for blurred vision, double vision and photophobia.   Respiratory: Negative for shortness of breath and stridor.    Cardiovascular: Positive for leg swelling. Negative for chest pain, palpitations, orthopnea and claudication.   Gastrointestinal: Negative for heartburn, nausea, vomiting and abdominal pain.   Skin: Negative for itching and rash.   Neurological: Negative for dizziness, focal weakness, seizures, loss of consciousness, weakness and headaches.   Past Medical History  Diagnosis Date  . Polymyalgia rheumatica   . Hypertension   . Hyperlipidemia   . Diabetic neuropathy   . Osteomyelitis of foot, left, acute   . Anxiety   . Insomnia, unspecified   . Unspecified vitamin D deficiency   . Anemia, unspecified   . Other chronic postoperative pain   . Unspecified hereditary and  idiopathic peripheral neuropathy   . CHF (congestive heart failure)   . Allergy   . Unspecified osteomyelitis, site unspecified   . Long term (current) use of anticoagulants   . 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 Ulcer     diabetic foot   . GERD (gastroesophageal reflux disease) 01/27/2009  . Diabetes mellitus 01/27/2009  . Arthralgia 2010    polyarticular  . CHF (congestive heart failure) 07/25/2009  . Hemorrhoids, internal 10/2011    small  . Myocardial infarction     mild 1995  . Stroke 01/10/06    no residual at this ti,e  . Sleep apnea     hx of, had surgery  . Chronic kidney disease     Followed by Dr Justin Mend   Past Surgical History  Procedure Laterality Date  . Amputation  01/21/2012    Procedure: AMPUTATION RAY;  Surgeon: [name], MD;  Location: [facility];  Service: Orthopedics;  Laterality: Left;  Left Foot 4th Ray Amputation  . Cervical discectomy  02/2011    Fusion  . Arthroscopic knee  surgery Left 08-25-2012  . Amputation right foot digits 1-4    . Tonsillectomy    . Eye surgery Bilateral     Lazer  . Bascilic vein transposition Left 10/19/2012    Procedure: BASCILIC VEIN TRANSPOSITION;  Surgeon: Serafina Mitchell, MD;  Location: [facility];  Service: Vascular;  Laterality: Left;   Current Outpatient Prescriptions on  File Prior to Visit  Medication Sig Dispense Refill  . amitriptyline (ELAVIL) 50 MG tablet Take 50 mg by mouth at bedtime.      Marland Kitchen amLODipine (NORVASC) 2.5 MG tablet Take 2.5 mg by mouth daily.      Marland Kitchen atenolol (TENORMIN) 25 MG tablet Take 25 mg by mouth every morning.      Marland Kitchen atorvastatin (LIPITOR) 40 MG tablet Take 40 mg by mouth daily.      . Blood Glucose Monitoring Suppl (BAYER CONTOUR MONITOR) W/DEVICE KIT Use as Directed  1 kit  0  . Cholecalciferol (VITAMIN D3) 50000 UNITS CAPS Take 1 capsule by mouth once a week.      . gabapentin (NEURONTIN) 100 MG capsule Take 100 mg by mouth 4 (four) times daily.      Marland Kitchen glipiZIDE (GLUCOTROL) 5 MG  tablet Take 1 tablet (5 mg total) by mouth 2 (two) times daily before a meal.  180 tablet  1  . Multiple Vitamin (MULTIVITAMIN WITH MINERALS) TABS Take 1 tablet by mouth daily.      . sucralfate (CARAFATE) 1 G tablet Take 1 g by mouth 4 (four) times daily.       No current facility-administered medications on file prior to visit.    Physical exam BP 116/72  Pulse 92  Temp(Src) 97.8 F (36.6 C) (Oral)  Wt 224 lb (101.606 kg)  SpO2 95%  Constitutional: He is oriented to person, place, and time. He appears well-developed and well-nourished. No distress.   HENT:   Head: Normocephalic and atraumatic.  Nose: Nose normal.  maxillary sinus tenderness present Mouth/Throat: mild Oropharynx erythema. No oropharyngeal exudate.   Neck: Normal range of motion. Neck supple. No JVD present. No tracheal deviation present. No thyromegaly present.   Cardiovascular: Normal rate, regular rhythm, normal heart sounds and intact distal pulses.    No murmur heard. Pulmonary/Chest: Effort normal and breath sounds normal. No respiratory distress. He has no wheezes. He has no rales. He exhibits no tenderness.   Abdominal: Soft. Bowel sounds are normal. He exhibits no distension and no mass. There is no tenderness. There is no rebound and no guarding.  Musculoskeletal: Normal range of motion. He exhibits edema of 1+ in right and 2+ in left leg upto knee, no erythema Left 4th toe amputated, scar tissue well healed, right toe 2 amputated, has c3-c6 fusion  Lymphadenopathy:  He has no cervical adenopathy.  Neurological: He is alert and oriented to person, place, and time. He has normal reflexes.  Decreased microfilament pinprick sensation upto below knee bilaterally, vibration intact  Skin: Skin is warm and dry. No rash noted. He is not diaphoretic. No erythema. No pallor.  Psychiatric: He has a normal mood and affect. His behavior is normal. Judgment and thought content normal.   Labs- Lab Results  Component  Value Date   HGBA1C 6.7* 12/13/2012   CBC    Component Value Date/Time   WBC 10.1 12/13/2012 1219   WBC 11.7* 02/01/2012 1340   RBC 3.43* 12/13/2012 1219   RBC 3.86* 02/01/2012 1340   HGB 9.8* 12/13/2012 1219   HCT 28.8* 12/13/2012 1219   PLT 246 02/01/2012 1340   MCV 84 12/13/2012 1219   MCH 28.6 12/13/2012 1219   MCH 27.2 02/01/2012 1340   MCHC 34.0 12/13/2012 1219   MCHC 31.6 02/01/2012 1340   RDW 14.4 12/13/2012 1219   RDW 13.9 02/01/2012 1340   LYMPHSABS 3.5* 12/13/2012 1219   LYMPHSABS 3.9 02/01/2012 1340  MONOABS 1.2* 02/01/2012 1340   EOSABS 0.3 12/13/2012 1219   EOSABS 1.4* 02/01/2012 1340   BASOSABS 0.1 12/13/2012 1219   BASOSABS 0.1 02/01/2012 1340   Assessment/plan  1. Claudication of left lower extremity Has history of PVD and also has neuropathy. This could be a mixture of neurogenic claudication and vascular. Will get ABI to assess for flow and if stent would help with the pain/ discomfort. Continue gabapentin - Ankle brachial index; Future  2. Type II or unspecified type diabetes mellitus with neurological manifestations, not stated as uncontrolled(250.60) continue glipizide for now. a1c 6.7 this visit. Has microalbuminuria and peripheral neuropathy. uptodate with eye exam. Continue asa and statin. Not on ACEI/ARB due to poor renal function.  - Hemoglobin A1c; Future  3. Peripheral autonomic neuropathy due to DM stable at present, continue gabapentin 100 mg qid for now  4. End stage renal disease plan is for dialysis. Has a fistula in place with good thrill. Follow with renal  5. Essential hypertension, benign bp well controlled this visit. Continue amlodipine, atenolol for now

## 2012-12-21 ENCOUNTER — Ambulatory Visit (HOSPITAL_COMMUNITY)
Admission: RE | Admit: 2012-12-21 | Discharge: 2012-12-21 | Disposition: A | Discharge status: Home / Self Care | Payer: Medicare HMO | Source: Ambulatory Visit | Attending: Vascular Surgery | Admitting: Vascular Surgery

## 2012-12-21 DIAGNOSIS — I739 Peripheral vascular disease, unspecified: Secondary | ICD-10-CM

## 2012-12-22 ENCOUNTER — Encounter: Payer: Self-pay | Admitting: Surgery

## 2012-12-25 ENCOUNTER — Ambulatory Visit (INDEPENDENT_AMBULATORY_CARE_PROVIDER_SITE_OTHER): Payer: Commercial Managed Care - HMO | Admitting: Surgery

## 2012-12-25 ENCOUNTER — Encounter: Payer: Self-pay | Admitting: Surgery

## 2012-12-25 VITALS — BP 135/77 | HR 80 | Ht 71.0 in | Wt 221.6 lb

## 2012-12-25 DIAGNOSIS — N186 End stage renal disease: Secondary | ICD-10-CM

## 2012-12-25 NOTE — Progress Notes (Signed)
The patient is back today for followup.  He is status post left basilic vein transposition on 10/19/2012.  He tells me he is scheduled to start dialysis this week.  I brought him back today because he had some swelling in his arm after his fistula.  He has a history of PICC line placement, and therefore I wonder to make sure his swelling resolved.  He has no complaints of swelling today.  He has no concerns over steal syndrome.  On examination there is an excellent thrill within his basilic vein transposition.  The patient tells me he is scheduled to start dialysis this week because of a recent decline in renal function.  He is not yet at the three-month mark, however I would prefer not to put a catheter in him.  I have told the patient to telemetry dialysis Center to start with small needles for several weeks before upsizing.  He will followup with me on an as-needed basis

## 2012-12-26 ENCOUNTER — Other Ambulatory Visit: Payer: Self-pay | Admitting: Internal Medicine

## 2012-12-26 DIAGNOSIS — N185 Chronic kidney disease, stage 5: Secondary | ICD-10-CM

## 2013-01-31 ENCOUNTER — Ambulatory Visit: Payer: Self-pay | Admitting: Internal Medicine

## 2013-01-31 DIAGNOSIS — Z0289 Encounter for other administrative examinations: Secondary | ICD-10-CM

## 2013-03-26 ENCOUNTER — Other Ambulatory Visit: Payer: Medicare HMO

## 2013-03-26 DIAGNOSIS — E1149 Type 2 diabetes mellitus with other diabetic neurological complication: Secondary | ICD-10-CM

## 2013-03-27 LAB — HEMOGLOBIN A1C
Est. average glucose Bld gHb Est-mCnc: 151 mg/dL
Hgb A1c MFr Bld: 6.9 % — ABNORMAL HIGH (ref 4.8–5.6)

## 2013-03-28 ENCOUNTER — Encounter: Payer: Self-pay | Admitting: Internal Medicine

## 2013-03-28 ENCOUNTER — Ambulatory Visit (INDEPENDENT_AMBULATORY_CARE_PROVIDER_SITE_OTHER): Payer: Commercial Managed Care - HMO | Admitting: Internal Medicine

## 2013-03-28 VITALS — BP 152/90 | HR 88 | Temp 97.6°F | Resp 14 | Wt 224.8 lb

## 2013-03-28 DIAGNOSIS — G909 Disorder of the autonomic nervous system, unspecified: Secondary | ICD-10-CM

## 2013-03-28 DIAGNOSIS — I10 Essential (primary) hypertension: Secondary | ICD-10-CM

## 2013-03-28 DIAGNOSIS — E1143 Type 2 diabetes mellitus with diabetic autonomic (poly)neuropathy: Secondary | ICD-10-CM

## 2013-03-28 DIAGNOSIS — N186 End stage renal disease: Secondary | ICD-10-CM

## 2013-03-28 DIAGNOSIS — E1149 Type 2 diabetes mellitus with other diabetic neurological complication: Secondary | ICD-10-CM

## 2013-03-28 MED ORDER — GLIPIZIDE 5 MG PO TABS: 5.0000 mg | ORAL_TABLET | Freq: Every day | ORAL | Status: DC

## 2013-03-28 MED ORDER — AMITRIPTYLINE HCL 50 MG PO TABS: 50.0000 mg | ORAL_TABLET | Freq: Every day | ORAL | Status: DC

## 2013-03-28 MED ORDER — ZOLPIDEM TARTRATE 10 MG PO TABS: ORAL_TABLET | ORAL | Status: DC

## 2013-03-28 NOTE — Progress Notes (Signed)
[name], male   DOB: 12-Sep-1960, 53 y.o.   MRN: [id number]    Chief Complaint  Patient presents with  . Medical Managment of Chronic Issues    3 month f/u & discuss labs (printed)   Allergies  Allergen Reactions  . Morphine And Related     hallucinations  . Tygacil [Tigecycline] Nausea And Vomiting   HPI 53 y/o male pt here for follow up. He has DM, ESRD, HTN among other medical problem. He has been started on hemodialysis since December and tolerating the procedure well. He feels tired after the procedure and has been having low bp readings post dialysis. He has been taken off all his bp medication. bp slightly elevated this visit.  cbg 116 this am Fasting cbg ranging between 90-110 with 4 readings in 60s with hypoglycemic symptoms present He walks for exercise and has been strict with his diet No further reflux symptoms He is sleeping well at night.   Review of Systems   Constitutional: Negative for fever, chills, weight loss, malaise/fatigue  HENT: negative for runny nose, cough, sore throat Eyes: Negative for blurred vision, double vision and photophobia.   Respiratory: Negative for shortness of breath and stridor.    Cardiovascular: leg edema have improved.Negative for chest pain, palpitations, orthopnea and claudication.  Gastrointestinal: Negative for heartburn, nausea, vomiting and abdominal pain.   Skin: Negative for itching and rash.   Neurological: Negative for dizziness, focal weakness, seizures, loss of consciousness, weakness and headaches.   Past Medical History  Diagnosis Date  . Polymyalgia rheumatica   . Hypertension   . Hyperlipidemia   . Diabetic neuropathy   . Osteomyelitis of foot, left, acute   . Anxiety   . Insomnia, unspecified   . Unspecified vitamin D deficiency   . Anemia, unspecified   . Other chronic postoperative pain   . Unspecified hereditary and idiopathic peripheral neuropathy   . CHF (congestive heart failure)   .  Allergy   . Unspecified osteomyelitis, site unspecified   . Long term (current) use of anticoagulants   . 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 Ulcer     diabetic foot   . GERD (gastroesophageal reflux disease) 01/27/2009  . Diabetes mellitus 01/27/2009  . Arthralgia 2010    polyarticular  . CHF (congestive heart failure) 07/25/2009  . Hemorrhoids, internal 10/2011    small  . Myocardial infarction     mild 1995  . Stroke 01/10/06    no residual at this ti,e  . Sleep apnea     hx of, had surgery  . Chronic kidney disease     Followed by Dr Justin Mend   Past Surgical History  Procedure Laterality Date  . Amputation  01/21/2012    Procedure: AMPUTATION RAY;  Surgeon: [name], MD;  Location: [facility] [facility];  Service: Orthopedics;  Laterality: Left;  Left Foot 4th Ray Amputation  . Cervical discectomy  02/2011    Fusion  . Arthroscopic knee  surgery Left 08-25-2012  . Amputation right foot digits 1-4    . Tonsillectomy    . Eye surgery Bilateral     Lazer  . Bascilic vein transposition Left 10/19/2012    Procedure: BASCILIC VEIN TRANSPOSITION;  Surgeon: Serafina Mitchell, MD;  Location: [facility];  Service: Vascular;  Laterality: Left;   Current Outpatient Prescriptions on File Prior to Visit  Medication Sig Dispense Refill  . Cholecalciferol (VITAMIN D3) 50000 UNITS CAPS Take 1 capsule by  mouth once a week.      . gabapentin (NEURONTIN) 100 MG capsule Take 100 mg by mouth 4 (four) times daily.      . Multiple Vitamin (MULTIVITAMIN WITH MINERALS) TABS Take 1 tablet by mouth daily.       No current facility-administered medications on file prior to visit.   Family History  Problem Relation Age of Onset  . Hypertension Mother   . Cancer Mother 56    Ovarian  . Heart disease Maternal Aunt   . Stroke Maternal Grandfather    History   Social History  . Marital Status: Married    Spouse Name: N/A    Number of Children: N/A  . Years of Education: N/A   Occupational History    . Not on file.   Social History Main Topics  . Smoking status: Former Smoker -- 0.25 packs/day for 15 years    Types: Cigarettes    Quit date: 10/19/2012  . Smokeless tobacco: Never Used  . Alcohol Use: No  . Drug Use: No  . Sexual Activity: Not on file   Other Topics Concern  . Not on file   Social History Narrative  . No narrative on file   Physical exam BP 152/90  Pulse 88  Temp(Src) 97.6 F (36.4 C) (Oral)  Resp 14  Wt 224 lb 12.8 oz (101.969 kg)  SpO2 98%  Constitutional: He is oriented to person, place, and time. He appears well-developed and well-nourished. No distress.   HENT:   Head: Normocephalic and atraumatic.  Nose: Nose normal.  maxillary sinus tenderness present Mouth/Throat: mild Oropharynx erythema. No oropharyngeal exudate.   Neck: Normal range of motion. Neck supple. No JVD present. No tracheal deviation present. No thyromegaly present.   Cardiovascular: Normal rate, regular rhythm, normal heart sounds and intact distal pulses.    No murmur heard. Pulmonary/Chest: Effort normal and breath sounds normal. No respiratory distress. He has no wheezes. He has no rales. He exhibits no tenderness.   Abdominal: Soft. Bowel sounds are normal. He exhibits no distension and no mass. There is no tenderness. There is no rebound and no guarding.  Musculoskeletal: Normal range of motion. Trace edema. 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  Neurological: He is alert and oriented to person, place, and time. He has normal reflexes.  Decreased microfilament pinprick sensation upto below knee bilaterally, vibration intact  Skin: Skin is warm and dry. No rash noted. He is not diaphoretic. No erythema. No pallor. Has AV fistula in left arm with good thrill Psychiatric: He has a normal mood and affect. His behavior is normal. Judgment and thought content normal.   Labs- Lab  Results  Component Value Date   HGBA1C 6.9* 03/26/2013   CMP     Component Value Date/Time   NA 141 10/19/2012 1009   NA 140 09/15/2012 1030   K 5.3* 10/19/2012 1009   CL 102 09/15/2012 1030   CO2 20 09/15/2012 1030   GLUCOSE 74 10/19/2012 1009   GLUCOSE 76 09/15/2012 1030   BUN 62* 09/15/2012 1030   BUN 54* 02/01/2012 1340   CREATININE 3.48* 09/15/2012 1030   CALCIUM 9.4 09/15/2012 1030   PROT 6.6 09/15/2012 1030   PROT 7.0 12/25/2011 0500   ALBUMIN 1.9* 12/25/2011 0500   AST 14 09/15/2012 1030   ALT 12 09/15/2012 1030   ALKPHOS 109 09/15/2012 1030   BILITOT 0.2  09/15/2012 1030   GFRNONAA 19* 09/15/2012 1030   GFRAA 22* 09/15/2012 1030   Assessment/plan  1. Essential hypertension, benign BP elevated in office visit but pt mentions having normal to low bp at home. Monitor bp readings in home setting. Warning signs with elevated bp explained - CMP; Future  2. Type II or unspecified type diabetes mellitus with neurological manifestations, not stated as uncontrolled(250.60) Reviewed a1c - gone up from 6.7 to 6.9. Reviewed home cbg reading. Has few episodes of hypoglycemia, all this month. Will decrease glipizide to 5 mg daily (well known to cause hypoglycemia). If continues to have hypoglycemic epsidoes, consider switching to tradjenta - Hemoglobin A1c; Future  3. Peripheral autonomic neuropathy due to DM Stable, continue neurontin, fall precautions  4. End stage renal disease Continue hemodialysis 3 days a week, labs done at dialysis centre

## 2013-04-11 HISTORY — PX: TOE SURGERY: SHX1073

## 2013-04-16 ENCOUNTER — Encounter (HOSPITAL_BASED_OUTPATIENT_CLINIC_OR_DEPARTMENT_OTHER): Payer: Medicare HMO | Attending: Plastic Surgery

## 2013-04-19 ENCOUNTER — Other Ambulatory Visit: Payer: Self-pay

## 2013-04-27 ENCOUNTER — Encounter (HOSPITAL_COMMUNITY): Payer: Self-pay

## 2013-04-27 ENCOUNTER — Other Ambulatory Visit (HOSPITAL_COMMUNITY): Payer: Self-pay | Admitting: Orthopedic Surgery

## 2013-05-01 NOTE — Pre-Procedure Instructions (Addendum)
Matthew Kline  05/01/2013   Your procedure is scheduled on:  05-04-2013  Friday   Report to [place]  2 * 3 at 1:30 PM   Call this number if you have problems the morning of surgery: [phone]   Remember:   Do not eat food or drink liquids after midnight.    Take these medicines the morning of surgery with A SIP OF WATER: gabapentin(Neurontin),ranitidine(Zantac)         Take all meds as ordered until day of surgery except as instructed below or per dr       Matthew Kline all herbel meds, nsaids (aleve,naproxen,advil,ibuprofen)now including vitamins, aspirin    DO NOT TAKE DIABETIC MED AM OF SURGERY   Do not wear jewelry.  Do not wear lotions, powders, or perfumes.   Do not shave 48 hours prior to surgery. Men may shave face and neck.  Do not bring valuables to the hospital.  [facility] is not responsible  for any belongings or valuables.               Contacts, dentures or bridgework may not be worn into surgery .  Leave suitcase in the car. After surgery it may be brought to your room.  For patients admitted to the hospital, discharge time is determined by your  treatment team.               Patients discharged the day of surgery will not be allowed to drive home.   Name and phone number of your driver:    Special Instructions:  Special Instructions: [facility] - Preparing for Surgery  Before surgery, you can play an important role.  Because skin is not sterile, your skin needs to be as free of germs as possible.  You can reduce the number of germs on you skin by washing with CHG (chlorahexidine gluconate) soap before surgery.  CHG is an antiseptic cleaner which kills germs and bonds with the skin to continue killing germs even after washing.  Please DO NOT use if you have an allergy to CHG or antibacterial soaps.  If your skin becomes reddened/irritated stop using the CHG and inform your nurse when you arrive at Short Stay.  Do not shave (including  legs and underarms) for at least 48 hours prior to the first CHG shower.  You may shave your face.  Please follow these instructions carefully:   1.  Shower with CHG Soap the night before surgery and the morning of Surgery.  2.  If you choose to wash your hair, wash your hair first as usual with your normal shampoo.  3.  After you shampoo, rinse your hair and body thoroughly to remove the Shampoo.  4.  Use CHG as you would any other liquid soap.  You can apply chg directly  to the skin and wash gently with scrungie or a clean washcloth.  5.  Apply the CHG Soap to your body ONLY FROM THE NECK DOWN.  Do not use on open wounds or open sores.  Avoid contact with your eyes ears, mouth and genitals (private parts).  Wash genitals (private parts)       with your normal soap.  6.  Wash thoroughly, paying special attention to the area where your surgery will be performed.  7.  Thoroughly rinse your body with warm water from the neck down.  8.  DO NOT shower/wash with your normal soap after using and rinsing off the CHG  Soap.  9.  Pat yourself dry with a clean towel.            10.  Wear clean pajamas.            11.  Place clean sheets on your bed the night of your first shower and do not sleep with pets.  Day of Surgery  Do not apply any lotions/deodorants the morning of surgery.  Please wear clean clothes to the hospital/surgery center.   Please read over the following fact sheets that you were given: Pain Booklet, Coughing and Deep Breathing and Surgical Site Infection Prevention

## 2013-05-02 ENCOUNTER — Encounter (HOSPITAL_COMMUNITY)
Admission: RE | Admit: 2013-05-02 | Discharge: 2013-05-02 | Disposition: A | Discharge status: Home / Self Care | Payer: Medicare HMO | Source: Ambulatory Visit | Attending: Orthopedic Surgery | Admitting: Orthopedic Surgery

## 2013-05-02 ENCOUNTER — Encounter (HOSPITAL_COMMUNITY): Payer: Self-pay

## 2013-05-02 HISTORY — DX: Pneumonia, unspecified organism: J18.9

## 2013-05-02 LAB — PROTIME-INR
INR: 1.02 (ref 0.00–1.49)
Prothrombin Time: 13.2 seconds (ref 11.6–15.2)

## 2013-05-02 LAB — COMPREHENSIVE METABOLIC PANEL
ALBUMIN: 2.7 g/dL — AB (ref 3.5–5.2)
ALT: 10 U/L (ref 0–53)
AST: 15 U/L (ref 0–37)
Alkaline Phosphatase: 105 U/L (ref 39–117)
BUN: 27 mg/dL — ABNORMAL HIGH (ref 6–23)
CO2: 29 mEq/L (ref 19–32)
Calcium: 9.3 mg/dL (ref 8.4–10.5)
Chloride: 96 mEq/L (ref 96–112)
Creatinine, Ser: 3.32 mg/dL — ABNORMAL HIGH (ref 0.50–1.35)
GFR calc Af Amer: 23 mL/min — ABNORMAL LOW (ref >90)
GFR calc non Af Amer: 20 mL/min — ABNORMAL LOW (ref >90)
Glucose, Bld: 212 mg/dL — ABNORMAL HIGH (ref 70–99)
POTASSIUM: 4.7 meq/L (ref 3.7–5.3)
Sodium: 137 mEq/L (ref 137–147)
TOTAL PROTEIN: 8.1 g/dL (ref 6.0–8.3)
Total Bilirubin: <0.2 mg/dL — ABNORMAL LOW (ref 0.3–1.2)

## 2013-05-02 LAB — CBC
HEMATOCRIT: 33.7 % — AB (ref 39.0–52.0)
Hemoglobin: 10.7 g/dL — ABNORMAL LOW (ref 13.0–17.0)
MCH: 28.1 pg (ref 26.0–34.0)
MCHC: 31.8 g/dL (ref 30.0–36.0)
MCV: 88.5 fL (ref 78.0–100.0)
PLATELETS: 257 10*3/uL (ref 150–400)
RBC: 3.81 MIL/uL — ABNORMAL LOW (ref 4.22–5.81)
RDW: 15.1 % (ref 11.5–15.5)
WBC: 10.4 10*3/uL (ref 4.0–10.5)

## 2013-05-02 LAB — APTT: APTT: 30 s (ref 24–37)

## 2013-05-02 NOTE — Progress Notes (Signed)
req'd stress,ekg, echo ,notes from [facility] done recently

## 2013-05-03 MED ORDER — CEFAZOLIN SODIUM-DEXTROSE 2-3 GM-% IV SOLR
2.0000 g | INTRAVENOUS | Status: AC
  Administered 2013-05-04: 2 g via INTRAVENOUS
  Filled 2013-05-03: qty 50

## 2013-05-03 NOTE — Progress Notes (Signed)
Anesthesia chart review: Patient is a 53 year old male scheduled for left great toe amputation at metatarsophalangeal joint on 05/04/13 by Dr. Sharol Given.  History includes smoking, hypertension, diabetes mellitus type 2, OSA, end-stage renal disease on hemodialysis via left basilic vein transposition, polymyalgia rheumatica, hyperlipidemia, anxiety, insomnia, anemia, "mild" MI '95, congestive heart failure, lacunar infarct 2006, GERD, previous left and right toe amputations, cervical discectomy. PCP is Dr. Bubba Camp.  Nephrologist is Dr. Justin Mend.  He had a normal stress echo on 04/18/13 at [facility]. Echo on 04/18/13 [facility]) showed: Normal left ventricular systolic function. EF > 55%. Normal right ventricular systolic function. Trivial tricuspid regurgitation. No valvular stenosis.  EKGs on 03/16/13 and 04/18/13 [facility]) showed NSR.  CXR on 10/10/12 showed: No significant abnormality.  Preoperative labs noted.  He will get an ISTAT on arrival.  If results are acceptable then I would anticipate that he could proceed as planned.  George Hugh [facility] Short Stay Center/Anesthesiology Phone [phone] 05/03/2013 9:38 AM

## 2013-05-04 ENCOUNTER — Ambulatory Visit (HOSPITAL_COMMUNITY): Payer: Medicare HMO | Admitting: Certified Registered Nurse Anesthetist

## 2013-05-04 ENCOUNTER — Ambulatory Visit (HOSPITAL_COMMUNITY)
Admission: RE | Admit: 2013-05-04 | Discharge: 2013-05-04 | Disposition: A | Discharge status: Home / Self Care | Payer: Medicare HMO | Source: Ambulatory Visit | Attending: Orthopedic Surgery | Admitting: Orthopedic Surgery

## 2013-05-04 ENCOUNTER — Encounter (HOSPITAL_COMMUNITY): Payer: Self-pay | Admitting: *Deleted

## 2013-05-04 ENCOUNTER — Encounter (HOSPITAL_COMMUNITY): Payer: Medicare HMO | Admitting: Vascular Surgery

## 2013-05-04 ENCOUNTER — Encounter (HOSPITAL_COMMUNITY)
Admission: RE | Disposition: A | Discharge status: Home / Self Care | Payer: Self-pay | Source: Ambulatory Visit | Attending: Orthopedic Surgery

## 2013-05-04 DIAGNOSIS — Z01812 Encounter for preprocedural laboratory examination: Secondary | ICD-10-CM | POA: Insufficient documentation

## 2013-05-04 DIAGNOSIS — Z8673 Personal history of transient ischemic attack (TIA), and cerebral infarction without residual deficits: Secondary | ICD-10-CM | POA: Insufficient documentation

## 2013-05-04 DIAGNOSIS — K219 Gastro-esophageal reflux disease without esophagitis: Secondary | ICD-10-CM | POA: Insufficient documentation

## 2013-05-04 DIAGNOSIS — E785 Hyperlipidemia, unspecified: Secondary | ICD-10-CM | POA: Insufficient documentation

## 2013-05-04 DIAGNOSIS — I2510 Atherosclerotic heart disease of native coronary artery without angina pectoris: Secondary | ICD-10-CM | POA: Insufficient documentation

## 2013-05-04 DIAGNOSIS — G4700 Insomnia, unspecified: Secondary | ICD-10-CM | POA: Insufficient documentation

## 2013-05-04 DIAGNOSIS — L97509 Non-pressure chronic ulcer of other part of unspecified foot with unspecified severity: Secondary | ICD-10-CM | POA: Insufficient documentation

## 2013-05-04 DIAGNOSIS — J680 Bronchitis and pneumonitis due to chemicals, gases, fumes and vapors: Secondary | ICD-10-CM | POA: Insufficient documentation

## 2013-05-04 DIAGNOSIS — F411 Generalized anxiety disorder: Secondary | ICD-10-CM | POA: Insufficient documentation

## 2013-05-04 DIAGNOSIS — M869 Osteomyelitis, unspecified: Secondary | ICD-10-CM

## 2013-05-04 DIAGNOSIS — E1142 Type 2 diabetes mellitus with diabetic polyneuropathy: Secondary | ICD-10-CM | POA: Insufficient documentation

## 2013-05-04 DIAGNOSIS — E559 Vitamin D deficiency, unspecified: Secondary | ICD-10-CM | POA: Insufficient documentation

## 2013-05-04 DIAGNOSIS — D649 Anemia, unspecified: Secondary | ICD-10-CM | POA: Insufficient documentation

## 2013-05-04 DIAGNOSIS — I509 Heart failure, unspecified: Secondary | ICD-10-CM | POA: Insufficient documentation

## 2013-05-04 DIAGNOSIS — Z7901 Long term (current) use of anticoagulants: Secondary | ICD-10-CM | POA: Insufficient documentation

## 2013-05-04 DIAGNOSIS — E1169 Type 2 diabetes mellitus with other specified complication: Secondary | ICD-10-CM | POA: Insufficient documentation

## 2013-05-04 DIAGNOSIS — M9080 Osteopathy in diseases classified elsewhere, unspecified site: Secondary | ICD-10-CM | POA: Insufficient documentation

## 2013-05-04 DIAGNOSIS — N189 Chronic kidney disease, unspecified: Secondary | ICD-10-CM | POA: Insufficient documentation

## 2013-05-04 DIAGNOSIS — I129 Hypertensive chronic kidney disease with stage 1 through stage 4 chronic kidney disease, or unspecified chronic kidney disease: Secondary | ICD-10-CM | POA: Insufficient documentation

## 2013-05-04 DIAGNOSIS — M353 Polymyalgia rheumatica: Secondary | ICD-10-CM | POA: Insufficient documentation

## 2013-05-04 DIAGNOSIS — G4730 Sleep apnea, unspecified: Secondary | ICD-10-CM

## 2013-05-04 DIAGNOSIS — F17200 Nicotine dependence, unspecified, uncomplicated: Secondary | ICD-10-CM | POA: Insufficient documentation

## 2013-05-04 DIAGNOSIS — I252 Old myocardial infarction: Secondary | ICD-10-CM | POA: Insufficient documentation

## 2013-05-04 HISTORY — PX: AMPUTATION: SHX166

## 2013-05-04 LAB — POCT I-STAT 4, (NA,K, GLUC, HGB,HCT)
GLUCOSE: 162 mg/dL — AB (ref 70–99)
HEMATOCRIT: 35 % — AB (ref 39.0–52.0)
Hemoglobin: 11.9 g/dL — ABNORMAL LOW (ref 13.0–17.0)
POTASSIUM: 4.2 meq/L (ref 3.7–5.3)
Sodium: 139 mEq/L (ref 137–147)

## 2013-05-04 LAB — GLUCOSE, CAPILLARY: Glucose-Capillary: 119 mg/dL — ABNORMAL HIGH (ref 70–99)

## 2013-05-04 SURGERY — AMPUTATION DIGIT
Anesthesia: General | Site: Foot | Laterality: Left

## 2013-05-04 MED ORDER — ONDANSETRON HCL 4 MG/2ML IJ SOLN: 4.0000 mg | Freq: Four times a day (QID) | INTRAMUSCULAR | Status: DC | PRN

## 2013-05-04 MED ORDER — FENTANYL CITRATE 0.05 MG/ML IJ SOLN
INTRAMUSCULAR | Status: AC
  Filled 2013-05-04: qty 5

## 2013-05-04 MED ORDER — PROPOFOL 10 MG/ML IV BOLUS
INTRAVENOUS | Status: AC
  Filled 2013-05-04: qty 20

## 2013-05-04 MED ORDER — OXYCODONE-ACETAMINOPHEN 5-325 MG PO TABS: 1.0000 | ORAL_TABLET | ORAL | Status: DC | PRN

## 2013-05-04 MED ORDER — OXYCODONE HCL 5 MG/5ML PO SOLN: 5.0000 mg | Freq: Once | ORAL | Status: AC | PRN

## 2013-05-04 MED ORDER — FENTANYL CITRATE 0.05 MG/ML IJ SOLN
INTRAMUSCULAR | Status: AC
  Filled 2013-05-04: qty 2

## 2013-05-04 MED ORDER — OXYCODONE HCL 5 MG PO TABS
ORAL_TABLET | ORAL | Status: AC
  Filled 2013-05-04: qty 1

## 2013-05-04 MED ORDER — 0.9 % SODIUM CHLORIDE (POUR BTL) OPTIME
TOPICAL | Status: DC | PRN
  Administered 2013-05-04: 1000 mL

## 2013-05-04 MED ORDER — ONDANSETRON HCL 4 MG/2ML IJ SOLN
INTRAMUSCULAR | Status: DC | PRN
  Administered 2013-05-04: 4 mg via INTRAVENOUS

## 2013-05-04 MED ORDER — SODIUM CHLORIDE 0.9 % IV SOLN
INTRAVENOUS | Status: DC
  Administered 2013-05-04: 14:00:00 via INTRAVENOUS
  Administered 2013-05-04: 10 mL/h via INTRAVENOUS

## 2013-05-04 MED ORDER — LIDOCAINE HCL (CARDIAC) 20 MG/ML IV SOLN
INTRAVENOUS | Status: DC | PRN
  Administered 2013-05-04: 60 mg via INTRAVENOUS

## 2013-05-04 MED ORDER — PROPOFOL 10 MG/ML IV BOLUS
INTRAVENOUS | Status: DC | PRN
  Administered 2013-05-04: 200 mg via INTRAVENOUS

## 2013-05-04 MED ORDER — FENTANYL CITRATE 0.05 MG/ML IJ SOLN
25.0000 ug | INTRAMUSCULAR | Status: DC | PRN
  Administered 2013-05-04: 25 ug via INTRAVENOUS

## 2013-05-04 MED ORDER — FENTANYL CITRATE 0.05 MG/ML IJ SOLN
INTRAMUSCULAR | Status: DC | PRN
  Administered 2013-05-04 (×2): 50 ug via INTRAVENOUS

## 2013-05-04 MED ORDER — MIDAZOLAM HCL 5 MG/5ML IJ SOLN
INTRAMUSCULAR | Status: DC | PRN
  Administered 2013-05-04: 2 mg via INTRAVENOUS

## 2013-05-04 MED ORDER — MIDAZOLAM HCL 2 MG/2ML IJ SOLN
INTRAMUSCULAR | Status: AC
  Filled 2013-05-04: qty 2

## 2013-05-04 MED ORDER — OXYCODONE HCL 5 MG PO TABS
5.0000 mg | ORAL_TABLET | Freq: Once | ORAL | Status: AC | PRN
  Administered 2013-05-04: 5 mg via ORAL

## 2013-05-04 SURGICAL SUPPLY — 25 items
BANDAGE GAUZE ELAST BULKY 4 IN (GAUZE/BANDAGES/DRESSINGS) ×3 IMPLANT
BNDG COHESIVE 4X5 TAN STRL (GAUZE/BANDAGES/DRESSINGS) ×3 IMPLANT
COVER SURGICAL LIGHT HANDLE (MISCELLANEOUS) ×3 IMPLANT
DRAPE U-SHAPE 47X51 STRL (DRAPES) ×3 IMPLANT
DRSG ADAPTIC 3X8 NADH LF (GAUZE/BANDAGES/DRESSINGS) ×3 IMPLANT
DRSG PAD ABDOMINAL 8X10 ST (GAUZE/BANDAGES/DRESSINGS) ×3 IMPLANT
DURAPREP 26ML APPLICATOR (WOUND CARE) ×3 IMPLANT
ELECT REM PT RETURN 9FT ADLT (ELECTROSURGICAL) ×3
ELECTRODE REM PT RTRN 9FT ADLT (ELECTROSURGICAL) ×1 IMPLANT
GLOVE BIOGEL PI IND STRL 6.5 (GLOVE) ×1 IMPLANT
GLOVE BIOGEL PI IND STRL 9 (GLOVE) ×1 IMPLANT
GLOVE BIOGEL PI INDICATOR 6.5 (GLOVE) ×2
GLOVE BIOGEL PI INDICATOR 9 (GLOVE) ×2
GLOVE SURG ORTHO 9.0 STRL STRW (GLOVE) ×3 IMPLANT
GOWN STRL REUS W/ TWL XL LVL3 (GOWN DISPOSABLE) ×2 IMPLANT
GOWN STRL REUS W/TWL XL LVL3 (GOWN DISPOSABLE) ×4
KIT BASIN OR (CUSTOM PROCEDURE TRAY) ×3 IMPLANT
KIT ROOM TURNOVER OR (KITS) ×3 IMPLANT
MANIFOLD NEPTUNE II (INSTRUMENTS) ×3 IMPLANT
NS IRRIG 1000ML POUR BTL (IV SOLUTION) ×3 IMPLANT
PACK ORTHO EXTREMITY (CUSTOM PROCEDURE TRAY) ×3 IMPLANT
PAD ARMBOARD 7.5X6 YLW CONV (MISCELLANEOUS) ×6 IMPLANT
SPONGE GAUZE 4X4 12PLY (GAUZE/BANDAGES/DRESSINGS) ×3 IMPLANT
SUT ETHILON 2 0 PSLX (SUTURE) ×3 IMPLANT
TOWEL OR 17X26 10 PK STRL BLUE (TOWEL DISPOSABLE) ×3 IMPLANT

## 2013-05-04 NOTE — H&P (Signed)
Matthew Kline is an 53 y.o. male.   Chief Complaint: Osteomyelitis right great toe HPI: Patient is a 53 year old gentleman with is dermatitis to the right great toe has failed conservative treatment  Past Medical History  Diagnosis Date  . Polymyalgia rheumatica   . Hypertension   . Hyperlipidemia   . Diabetic neuropathy   . Osteomyelitis of foot, left, acute   . Anxiety   . Insomnia, unspecified   . Unspecified vitamin D deficiency   . Anemia, unspecified   . Other chronic postoperative pain   . Unspecified hereditary and idiopathic peripheral neuropathy   . Allergy   . Unspecified osteomyelitis, site unspecified   . Long term (current) use of anticoagulants   . 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 Ulcer     diabetic foot   . GERD (gastroesophageal reflux disease) 01/27/2009  . Diabetes mellitus 01/27/2009  . Arthralgia 2010    polyarticular  . Hemorrhoids, internal 10/2011    small  . Myocardial infarction     mild 1995  . Stroke 01/10/06    no residual at this ti,e  . Sleep apnea     hx of, had surgery  . CHF (congestive heart failure)   . CHF (congestive heart failure) 07/25/2009    denies  . Pneumonia     hx  . Chronic kidney disease     Followed by Dr Addison Naegeli    Past Surgical History  Procedure Laterality Date  . Amputation  01/21/2012    Procedure: AMPUTATION RAY;  Surgeon: [name], MD;  Location: [place];  Service: Orthopedics;  Laterality: Left;  Left Foot 4th Ray Amputation  . Cervical discectomy  02/2011    Fusion  . Arthroscopic knee  surgery Left 08-25-2012  . Amputation right foot digits 1-4    . Eye surgery Bilateral     [facility]  . Bascilic vein transposition Left 10/19/2012    Procedure: BASCILIC VEIN TRANSPOSITION;  Surgeon: Serafina Mitchell, MD;  Location: [place];  Service: Vascular;  Laterality: Left;  . Tonsillectomy      Family History  Problem Relation Age of Onset  . Hypertension Mother   . Cancer Mother 39     Ovarian  . Heart disease Maternal Aunt   . Stroke Maternal Grandfather    Social History:  reports that he has been smoking Cigarettes.  He has a 3.75 pack-year smoking history. He has never used smokeless tobacco. He reports that he does not drink alcohol or use illicit drugs.  Allergies:  Allergies  Allergen Reactions  . Morphine And Related     hallucinations  . Tygacil [Tigecycline] Nausea And Vomiting    No prescriptions prior to admission    Results for orders placed during the hospital encounter of 05/02/13 (from the past 48 hour(s))  APTT     Status: None   Collection Time    05/02/13  9:02 AM      Result Value Ref Range   aPTT 30  24 - 37 seconds  CBC     Status: Abnormal   Collection Time    05/02/13  9:02 AM      Result Value Ref Range   WBC 10.4  4.0 - 10.5 K/uL   RBC 3.81 (*) 4.22 - 5.81 MIL/uL   Hemoglobin 10.7 (*) 13.0 - 17.0 g/dL   HCT 33.7 (*) 39.0 - 52.0 %   MCV 88.5  78.0 - 100.0 fL  MCH 28.1  26.0 - 34.0 pg   MCHC 31.8  30.0 - 36.0 g/dL   RDW 15.1  11.5 - 15.5 %   Platelets 257  150 - 400 K/uL  COMPREHENSIVE METABOLIC PANEL     Status: Abnormal   Collection Time    05/02/13  9:02 AM      Result Value Ref Range   Sodium 137  137 - 147 mEq/L   Potassium 4.7  3.7 - 5.3 mEq/L   Chloride 96  96 - 112 mEq/L   CO2 29  19 - 32 mEq/L   Glucose, Bld 212 (*) 70 - 99 mg/dL   BUN 27 (*) 6 - 23 mg/dL   Creatinine, Ser 3.32 (*) 0.50 - 1.35 mg/dL   Calcium 9.3  8.4 - 10.5 mg/dL   Total Protein 8.1  6.0 - 8.3 g/dL   Albumin 2.7 (*) 3.5 - 5.2 g/dL   AST 15  0 - 37 U/L   ALT 10  0 - 53 U/L   Alkaline Phosphatase 105  39 - 117 U/L   Total Bilirubin <0.2 (*) 0.3 - 1.2 mg/dL   GFR calc non Af Amer 20 (*) >90 mL/min   GFR calc Af Amer 23 (*) >90 mL/min   Comment: (NOTE)     The eGFR has been calculated using the CKD EPI equation.     This calculation has not been validated in all clinical situations.     eGFR's persistently <90 mL/min signify possible  Chronic Kidney     Disease.  PROTIME-INR     Status: None   Collection Time    05/02/13  9:02 AM      Result Value Ref Range   Prothrombin Time 13.2  11.6 - 15.2 seconds   INR 1.02  0.00 - 1.49   No results found.  Review of Systems  All other systems reviewed and are negative.   There were no vitals taken for this visit. Physical Exam  On examination there are palpable pulses there is ulceration with osteomyelitis of the right great toe Assessment/Plan Assessment: Osteomyelitis right great toe.  Plan: Will plan for great toe amputation. Risks and benefits were discussed including nonhealing of the wound need for additional surgery. Patient states he understands was to proceed at this time.  [name] 05/04/2013, 6:37 AM

## 2013-05-04 NOTE — Transfer of Care (Signed)
Immediate Anesthesia Transfer of Care Note  Patient: Matthew Kline  Procedure(s) Performed: Procedure(s) with comments: AMPUTATION DIGIT (Left) - Left Great Toe Amputation at MTP  Patient Location: PACU  Anesthesia Type:General  Level of Consciousness: awake, alert  and oriented  Airway & Oxygen Therapy: Patient Spontanous Breathing  Post-op Assessment: Report given to PACU RN  Post vital signs: stable  Complications: No apparent anesthesia complications

## 2013-05-04 NOTE — Anesthesia Procedure Notes (Signed)
Procedure Name: LMA Insertion Date/Time: 05/04/2013 2:49 PM Performed by: Maeola Harman Pre-anesthesia Checklist: Patient identified, Emergency Drugs available, Suction available, Patient being monitored and Timeout performed Patient Re-evaluated:Patient Re-evaluated prior to inductionOxygen Delivery Method: Circle system utilized Preoxygenation: Pre-oxygenation with 100% oxygen Intubation Type: IV induction LMA: LMA inserted LMA Size: 5.0 Number of attempts: 1 Tube secured with: Tape Dental Injury: Teeth and Oropharynx as per pre-operative assessment  Comments: Easy induction and LMA insertion.  Dr. Marcie Bal verified placement.  Waldron Session, CRNA

## 2013-05-04 NOTE — Anesthesia Postprocedure Evaluation (Signed)
Anesthesia Post Note  Patient: Matthew Kline  Procedure(s) Performed: Procedure(s) (LRB): AMPUTATION DIGIT (Left)  Anesthesia type: General  Patient location: PACU  Post pain: Pain level controlled and Adequate analgesia  Post assessment: Post-op Vital signs reviewed, Patient's Cardiovascular Status Stable, Respiratory Function Stable, Patent Airway and Pain level controlled  Last Vitals:  Filed Vitals:   05/04/13 1553  BP:   Pulse: 88  Temp:   Resp: 15    Post vital signs: Reviewed and stable  Level of consciousness: awake, alert  and oriented  Complications: No apparent anesthesia complications

## 2013-05-04 NOTE — Discharge Instructions (Signed)

## 2013-05-04 NOTE — Op Note (Signed)
OPERATIVE REPORT  DATE OF SURGERY: 05/04/2013  PATIENT:  Matthew Kline,  52 y.o. male  PRE-OPERATIVE DIAGNOSIS:  Osteomyelitis Left Great Toe  POST-OPERATIVE DIAGNOSIS:  Osteomyelitis Left Great Toe  PROCEDURE:  Procedure(s): AMPUTATION DIGIT left great toe at the MTP joint.  SURGEON:  Surgeon(s): [name], MD  ANESTHESIA:   general  EBL:  Minimal ML  SPECIMEN:  No Specimen  TOURNIQUET:  * No tourniquets in log *  PROCEDURE DETAILS: Patient is a 53 year old gentleman with osteomyelitis abscess ulceration of the left foot great toe patient presents today for amputation through the MTP joint after failure of conservative care. Risks and benefits were discussed including nonhealing of the wound need for additional surgery. Patient states he understands and wished to proceed at this time. Description of procedure patient was brought to the operating room and underwent a general anesthetic. After adequate levels of anesthesia were obtained patient's left lower extremity was prepped using DuraPrep draped into a sterile field. A racquet incision was made just distal to the MTP joint. The toe was amputated with one block of tissue. The wound was irrigated with normal saline. Hemostasis was obtained. The incision was closed using 2-0 nylon. The second toe had a superficial ulcer this was cleansed and this did not go down to bone this had no depth. A sterile dressing was applied and a Coban compression wrap patient was taken to the PACU in stable condition plan for discharge to home.  PLAN OF CARE: Discharge to home after PACU  PATIENT DISPOSITION:  PACU - hemodynamically stable.   [name], MD 05/04/2013 3:43 PM

## 2013-05-04 NOTE — Anesthesia Preprocedure Evaluation (Addendum)
Anesthesia Evaluation  Patient identified by MRN, date of birth, ID band Patient awake    Reviewed: Allergy & Precautions, H&P , NPO status , Patient's Chart, lab work & pertinent test results, reviewed documented beta blocker date and time   Airway Mallampati: II  Neck ROM: full    Dental  (+) Dental Advisory Given, Poor Dentition   Pulmonary neg pulmonary ROS, sleep apnea , pneumonia -, Current Smoker,  breath sounds clear to auscultation        Cardiovascular hypertension, + CAD, + Past MI and +CHF Rhythm:Regular     Neuro/Psych Anxiety  Neuromuscular disease CVA, No Residual Symptoms    GI/Hepatic GERD-  Medicated and Controlled,  Endo/Other  diabetes, Type 2, Oral Hypoglycemic Agentsobese  Renal/GU ESRFRenal disease     Musculoskeletal   Abdominal (+)  Abdomen: soft. Bowel sounds: normal.  Peds  Hematology  (+) anemia ,   Anesthesia Other Findings   Reproductive/Obstetrics                        Anesthesia Physical Anesthesia Plan  ASA: III  Anesthesia Plan: General   Post-op Pain Management:    Induction: Intravenous  Airway Management Planned: LMA  Additional Equipment:   Intra-op Plan:   Post-operative Plan:   Informed Consent: I have reviewed the patients History and Physical, chart, labs and discussed the procedure including the risks, benefits and alternatives for the proposed anesthesia with the patient or authorized representative who has indicated his/her understanding and acceptance.     Plan Discussed with: CRNA, Anesthesiologist and Surgeon  Anesthesia Plan Comments:         Anesthesia Quick Evaluation

## 2013-05-04 NOTE — Progress Notes (Signed)
Care of pt assumed by MA Lauris Keepers RN 

## 2013-05-08 ENCOUNTER — Encounter (HOSPITAL_COMMUNITY): Payer: Self-pay | Admitting: Orthopedic Surgery

## 2013-05-10 ENCOUNTER — Telehealth: Payer: Self-pay | Admitting: Internal Medicine

## 2013-05-10 NOTE — Telephone Encounter (Signed)
He can have an ortho referral

## 2013-05-10 NOTE — Telephone Encounter (Signed)
I received a call from [place] from [facility] patients sees them regularly but due to his insurance they are now requiring a referral from [place]. Can patient have an ortho referral or does he need to come into the office for an appointment? Please advise

## 2013-05-11 ENCOUNTER — Other Ambulatory Visit: Payer: Self-pay | Admitting: *Deleted

## 2013-05-11 DIAGNOSIS — M47812 Spondylosis without myelopathy or radiculopathy, cervical region: Secondary | ICD-10-CM

## 2013-05-11 NOTE — Telephone Encounter (Signed)
referral entered into Epic

## 2013-05-22 ENCOUNTER — Encounter: Payer: Self-pay | Admitting: Internal Medicine

## 2013-05-22 ENCOUNTER — Ambulatory Visit (INDEPENDENT_AMBULATORY_CARE_PROVIDER_SITE_OTHER): Payer: Medicare HMO | Admitting: Internal Medicine

## 2013-05-22 VITALS — BP 178/98 | HR 112 | Temp 98.7°F | Wt 224.4 lb

## 2013-05-22 DIAGNOSIS — J209 Acute bronchitis, unspecified: Secondary | ICD-10-CM

## 2013-05-22 MED ORDER — AMOXICILLIN-POT CLAVULANATE 875-125 MG PO TABS: 1.0000 | ORAL_TABLET | Freq: Two times a day (BID) | ORAL | Status: DC

## 2013-05-22 MED ORDER — ALBUTEROL SULFATE HFA 108 (90 BASE) MCG/ACT IN AERS: 2.0000 | INHALATION_SPRAY | Freq: Four times a day (QID) | RESPIRATORY_TRACT | Status: DC | PRN

## 2013-05-22 MED ORDER — GUAIFENESIN ER 600 MG PO TB12: 600.0000 mg | ORAL_TABLET | Freq: Two times a day (BID) | ORAL | Status: DC | PRN

## 2013-05-22 NOTE — Progress Notes (Signed)
[name], male   DOB: 03/29/1960, 53 y.o.   MRN: [id number]     Cc- concern for uri/infection  HPI 53 y/o pt with DM and ESRD on dialysis is here with complaints of cough with greenish yellow phlegm, post nasal drip for a week and now sore throat and ear aches x 2 days. He feels tired and has bodyaches. No fever or chills. No nausea or vomiting. No ear discharge. Has stuffy nose but denies nasal discharge.   ROS Appetite is fair No abdominal pain, chest pain or dyspnea. Feels stuffed in his chest No diarrhea No urinary complaints  Past Medical History  Diagnosis Date  . Polymyalgia rheumatica   . Hypertension   . Hyperlipidemia   . Diabetic neuropathy   . Osteomyelitis of foot, left, acute   . Anxiety   . Insomnia, unspecified   . Unspecified vitamin D deficiency   . Anemia, unspecified   . Other chronic postoperative pain   . Unspecified hereditary and idiopathic peripheral neuropathy   . Allergy   . Unspecified osteomyelitis, site unspecified   . Long term (current) use of anticoagulants   . 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 Ulcer     diabetic foot   . GERD (gastroesophageal reflux disease) 01/27/2009  . Diabetes mellitus 01/27/2009  . Arthralgia 2010    polyarticular  . Hemorrhoids, internal 10/2011    small  . Myocardial infarction     mild 1995  . Stroke 01/10/06    no residual at this ti,e  . Sleep apnea     hx of, had surgery  . CHF (congestive heart failure)   . CHF (congestive heart failure) 07/25/2009    denies  . Pneumonia     hx  . Chronic kidney disease     Followed by Dr Justin Mend tues,thurs,sat   Medication reviewed. See [facility]  Physical exam BP 178/98  Pulse 112  Temp(Src) 98.7 F (37.1 C) (Oral)  Wt 224 lb 6.4 oz (101.787 kg)  SpO2 93%  General- male in no acute distress Head- atraumatic, normocephalic Eyes- PERRLA, EOMI, no pallor, no icterus Neck- no lymphadenopathy Nose, normal nasal mucosa, right  maxillary sinus tenderness, other sinus exam normal Throat- moist mucus membrane, no enlarged tonsils, oropharyngeal erythema present but no exudates Cardiovascular- normal s1,s2, no murmurs/ rubs/ gallops Respiratory- bilateral decreased air entry with expiratory wheezeAbdomen- bowel sounds present, soft, non tender, no organomegaly, no abdominal bruits, no guarding or rigidity, no CVA tenderness Musculoskeletal- able to move all 4 extremities Psychiatry- alert and oriented to person, place and time, normal mood and affect  Assessment/plan  1. Acute bronchitis will treat him with augmentin 875 bid for 10 days, albuterol inhaler bid x 3 days and then prn, mucinex bid prn for cough and to take a cup of yoghurt daily to help prevent gut flora. Will get a  Chest xray in 2-3 days if no clinical improvement noted - DG Chest 2 View; Future

## 2013-06-25 ENCOUNTER — Other Ambulatory Visit: Payer: Medicare HMO

## 2013-06-25 DIAGNOSIS — E1149 Type 2 diabetes mellitus with other diabetic neurological complication: Secondary | ICD-10-CM

## 2013-06-25 DIAGNOSIS — I10 Essential (primary) hypertension: Secondary | ICD-10-CM

## 2013-06-26 LAB — COMPREHENSIVE METABOLIC PANEL
A/G RATIO: 1.3 (ref 1.1–2.5)
ALT: 12 IU/L (ref 0–44)
AST: 15 IU/L (ref 0–40)
Albumin: 4 g/dL (ref 3.5–5.5)
Alkaline Phosphatase: 101 IU/L (ref 39–117)
BUN/Creatinine Ratio: 10 (ref 9–20)
BUN: 52 mg/dL — ABNORMAL HIGH (ref 6–24)
CALCIUM: 9.2 mg/dL (ref 8.7–10.2)
CO2: 24 mmol/L (ref 18–29)
CREATININE: 5.4 mg/dL — AB (ref 0.76–1.27)
Chloride: 101 mmol/L (ref 97–108)
GFR calc Af Amer: 13 mL/min/{1.73_m2} — ABNORMAL LOW (ref >59)
GFR, EST NON AFRICAN AMERICAN: 11 mL/min/{1.73_m2} — AB (ref >59)
GLOBULIN, TOTAL: 3.1 g/dL (ref 1.5–4.5)
Glucose: 207 mg/dL — ABNORMAL HIGH (ref 65–99)
Potassium: 5.3 mmol/L — ABNORMAL HIGH (ref 3.5–5.2)
Sodium: 142 mmol/L (ref 134–144)
Total Bilirubin: <0.2 mg/dL (ref 0.0–1.2)
Total Protein: 7.1 g/dL (ref 6.0–8.5)

## 2013-06-26 LAB — HEMOGLOBIN A1C
ESTIMATED AVERAGE GLUCOSE: 146 mg/dL
Hgb A1c MFr Bld: 6.7 % — ABNORMAL HIGH (ref 4.8–5.6)

## 2013-06-27 ENCOUNTER — Encounter: Payer: Self-pay | Admitting: Internal Medicine

## 2013-06-27 ENCOUNTER — Ambulatory Visit (INDEPENDENT_AMBULATORY_CARE_PROVIDER_SITE_OTHER): Payer: Medicare HMO | Admitting: Internal Medicine

## 2013-06-27 VITALS — BP 160/90 | HR 87 | Temp 98.3°F | Resp 18 | Wt 227.8 lb

## 2013-06-27 DIAGNOSIS — N189 Chronic kidney disease, unspecified: Secondary | ICD-10-CM

## 2013-06-27 DIAGNOSIS — I798 Other disorders of arteries, arterioles and capillaries in diseases classified elsewhere: Secondary | ICD-10-CM

## 2013-06-27 DIAGNOSIS — E1159 Type 2 diabetes mellitus with other circulatory complications: Secondary | ICD-10-CM

## 2013-06-27 DIAGNOSIS — F3289 Other specified depressive episodes: Secondary | ICD-10-CM

## 2013-06-27 DIAGNOSIS — F32A Depression, unspecified: Secondary | ICD-10-CM

## 2013-06-27 DIAGNOSIS — I129 Hypertensive chronic kidney disease with stage 1 through stage 4 chronic kidney disease, or unspecified chronic kidney disease: Secondary | ICD-10-CM

## 2013-06-27 DIAGNOSIS — F329 Major depressive disorder, single episode, unspecified: Secondary | ICD-10-CM | POA: Insufficient documentation

## 2013-06-27 DIAGNOSIS — N529 Male erectile dysfunction, unspecified: Secondary | ICD-10-CM

## 2013-06-27 LAB — SPECIMEN STATUS REPORT

## 2013-06-27 MED ORDER — GLIPIZIDE 5 MG PO TABS: 5.0000 mg | ORAL_TABLET | Freq: Two times a day (BID) | ORAL | Status: DC

## 2013-06-27 MED ORDER — SILDENAFIL CITRATE 25 MG PO TABS: 50.0000 mg | ORAL_TABLET | Freq: Every day | ORAL | Status: DC | PRN

## 2013-06-27 NOTE — Progress Notes (Signed)
[name], male   DOB: 1960-09-29, 53 y.o.   MRN: [id number]    Chief Complaint  Patient presents with  . Follow-up    3 month for blood pressure and diabetes   Allergies  Allergen Reactions  . Morphine And Related     hallucinations  . Tygacil [Tigecycline] Nausea And Vomiting, Nausea Only and Other (See Comments)   HPI 53 y/o male patient is here for routine follow up. 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 bp slightly elevated this visit. He has been off all bp medication given his orthostasis especially post dialysis He has been having problem with erection. He is unable to obtain erection most of the days and if he does, he has problem maintaining it. He has also noted some retrograde ejaculation. No problem with urination He walks for exercise and has been strict with his diet No further reflux symptoms He is sleeping well at night and mood has been better  Review of Systems   Constitutional: Negative for fever, chills, weight loss, malaise/fatigue. Has gained few pounds HENT: negative for runny nose, cough, sore throat Eyes: Negative for blurred vision, double vision and photophobia.   Respiratory: Negative for shortness of breath and stridor.    Cardiovascular: leg edema have improved.Negative for chest pain, palpitations, orthopnea and claudication.   Gastrointestinal: Negative for heartburn, nausea, vomiting and abdominal pain.  Skin: Negative for itching and rash.   Neurological: Negative for dizziness, focal weakness, seizures, loss of consciousness, weakness and headaches.     Past Medical History  Diagnosis Date  . Polymyalgia rheumatica   . Hypertension   . Hyperlipidemia   . Diabetic neuropathy   . Osteomyelitis of foot, left, acute   . Anxiety   . Insomnia, unspecified   . Unspecified vitamin D deficiency   . Anemia, unspecified   . Other chronic postoperative pain   . Unspecified hereditary and  idiopathic peripheral neuropathy   . Allergy   . Unspecified osteomyelitis, site unspecified   . Long term (current) use of anticoagulants   . 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 Ulcer     diabetic foot   . GERD (gastroesophageal reflux disease) 01/27/2009  . Diabetes mellitus 01/27/2009  . Arthralgia 2010    polyarticular  . Hemorrhoids, internal 10/2011    small  . Myocardial infarction     mild 1995  . Stroke 01/10/06    no residual at this ti,e  . Sleep apnea     hx of, had surgery  . CHF (congestive heart failure)   . CHF (congestive heart failure) 07/25/2009    denies  . Pneumonia     hx  . Chronic kidney disease     Followed by Dr Addison Naegeli   Current Outpatient Prescriptions on File Prior to Visit  Medication Sig Dispense Refill  . amitriptyline (ELAVIL) 50 MG tablet Take 1 tablet (50 mg total) by mouth at bedtime.  90 tablet  3  . calcium acetate (PHOSLO) 667 MG capsule Take 667 mg by mouth 3 (three) times daily with meals.      . Cholecalciferol (VITAMIN D PO) Take 1 tablet by mouth 3 (three) times a week. At dialysis      . gabapentin (NEURONTIN) 100 MG capsule Take 200 mg by mouth 2 (two) times daily.       Marland Kitchen glipiZIDE (GLUCOTROL) 5 MG tablet Take by mouth 2 (two) times  daily before a meal.      . multivitamin (RENA-VIT) TABS tablet Take 1 tablet by mouth daily.      . ranitidine (ZANTAC) 150 MG tablet Take 150 mg by mouth daily.      Marland Kitchen zolpidem (AMBIEN) 10 MG tablet Take 10 mg by mouth at bedtime as needed for sleep.       No current facility-administered medications on file prior to visit.    Physical exam BP 160/90  Pulse 87  Temp(Src) 98.3 F (36.8 C)  Resp 18  Wt 227 lb 12.8 oz (103.329 kg)  SpO2 96%  Constitutional: He is oriented to person, place, and time. He appears well-developed and well-nourished. No distress.   Head: Normocephalic and atraumatic.  Nose: Nose normal.   Mouth/Throat: mild Oropharynx erythema. No  oropharyngeal exudate.   Neck: Normal range of motion. Neck supple. No JVD present. No tracheal deviation present. No thyromegaly present.   Cardiovascular: Normal rate, regular rhythm, normal heart sounds and intact distal pulses.    No murmur heard. Pulmonary/Chest: Effort normal and breath sounds normal. No respiratory distress. He has no wheezes. He has no rales. He exhibits no tenderness.   Abdominal: Soft. Bowel sounds are normal. He exhibits no distension and no mass. There is no tenderness. There is no rebound and no guarding.  Musculoskeletal: Normal range of motion. Trace edema. 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  Neurological: He is alert and oriented to person, place, and time. He has normal reflexes.  Decreased microfilament pinprick sensation upto below knee bilaterally, vibration intact  Skin: Skin is warm and dry. No rash noted. He is not diaphoretic. No erythema. No pallor. Has AV fistula in left arm with good thrill Psychiatric: He has a normal mood and affect. His behavior is normal. Judgment and thought content normal.   Labs:  Lab Results  Component Value Date   HGBA1C 6.7* 06/25/2013   CMP     Component Value Date/Time   NA 142 06/25/2013 1007   NA 139 05/04/2013 1251   K 5.3* 06/25/2013 1007   CL 101 06/25/2013 1007   CO2 24 06/25/2013 1007   GLUCOSE 207* 06/25/2013 1007   GLUCOSE 162* 05/04/2013 1251   BUN 52* 06/25/2013 1007   BUN 27* 05/02/2013 0902   CREATININE 5.40* 06/25/2013 1007   CALCIUM 9.2 06/25/2013 1007   PROT 7.1 06/25/2013 1007   PROT 8.1 05/02/2013 0902   ALBUMIN 2.7* 05/02/2013 0902   AST 15 06/25/2013 1007   ALT 12 06/25/2013 1007   ALKPHOS 101 06/25/2013 1007   BILITOT <0.2 06/25/2013 1007   GFRNONAA 11* 06/25/2013 1007   GFRAA 13* 06/25/2013 1007   Lipid Panel     Component Value Date/Time   TRIG 377* 09/15/2012 1030   HDL 40 09/15/2012 1030    CHOLHDL 5.7* 09/15/2012 1030   LDLCALC 113* 09/15/2012 1030    Assessment/plan  1. Erectile dysfunction Likely from vascular disease given his history of HTN, DM and CKD. Will check testosterone level to help rule out hypogonadism. Will have him on viagra 50 mg daily as needed for now and reassess in a month. Common side effects explained. PSA has been normal in 9/14  2. Hypertensive renal disease On dialysis. Elevated bp in office but has been off all bp medication with fluctuating bp esp during dialysis. Monitor bp. Warning signs with elevated bp explained  3.  DM type 2 causing vascular disease Reviewed a1c s/o well controlled dm. Continue glipizide 5 mg daily. Monitor cbg. Off ACEI with impaired renal function. Consider statin and aspirin on next visit. Continue gabapentin for neuropathic pain  4. Depression Stable on elavil, monitor clinically

## 2013-06-28 LAB — TESTOSTERONE: Testosterone: 366 ng/dL (ref 348–1197)

## 2013-06-28 LAB — SPECIMEN STATUS REPORT

## 2013-06-29 ENCOUNTER — Encounter: Payer: Self-pay | Admitting: *Deleted

## 2013-07-12 ENCOUNTER — Ambulatory Visit (INDEPENDENT_AMBULATORY_CARE_PROVIDER_SITE_OTHER): Payer: Commercial Managed Care - HMO | Admitting: Nurse Practitioner

## 2013-07-12 ENCOUNTER — Other Ambulatory Visit: Payer: Self-pay | Admitting: Nurse Practitioner

## 2013-07-12 ENCOUNTER — Encounter: Payer: Self-pay | Admitting: Nurse Practitioner

## 2013-07-12 ENCOUNTER — Ambulatory Visit
Admission: RE | Admit: 2013-07-12 | Discharge: 2013-07-12 | Disposition: A | Discharge status: Home / Self Care | Payer: Commercial Managed Care - HMO | Source: Ambulatory Visit | Attending: Nurse Practitioner | Admitting: Nurse Practitioner

## 2013-07-12 VITALS — BP 170/100 | HR 105 | Temp 98.4°F | Resp 18 | Ht 71.0 in | Wt 231.2 lb

## 2013-07-12 DIAGNOSIS — N184 Chronic kidney disease, stage 4 (severe): Secondary | ICD-10-CM

## 2013-07-12 DIAGNOSIS — R509 Fever, unspecified: Secondary | ICD-10-CM

## 2013-07-12 DIAGNOSIS — I10 Essential (primary) hypertension: Secondary | ICD-10-CM

## 2013-07-12 NOTE — Patient Instructions (Signed)
Make sure you go to dialysis today When they draw labs please have them sent to our office  Artifical tears for dry eyes, use as needed  Will get urine and chest xray today

## 2013-07-12 NOTE — Progress Notes (Signed)
[name], male   DOB: [birth year], 53 y.o.   MRN: [id number]    Allergies  Allergen Reactions  . Morphine And Related     hallucinations  . Tygacil [Tigecycline] Nausea And Vomiting, Nausea Only and Other (See Comments)    Chief Complaint  Patient presents with  . Acute Visit    fever 101/am, chest hurts,    HPI: Patient is a 53 y.o. 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 Pt reports fever of 101.7 yesterday then 101.2 this morning took tyelnol which brought his fever down. Reports burning in his eyes and muscles in chest hurt. Reports that he has not been coughing a lot. Feels like he he sensitive everywhere on his skin. Has granddaughter who has been sick and goes to a day care.  No shortness of breath, no chest pains Eyes burning which is new, no discharge no blurred visit, has red eye Started feeling bad after dialysis on Tuesday which is normal but then he still felt bad yesterday which was not normal then noticed the temperature .  Review of Systems:  Review of Systems  Constitutional: Positive for fever, chills and malaise/fatigue.  HENT: Positive for sore throat (slightly ). Negative for congestion, ear discharge and ear pain.   Eyes: Positive for redness. Negative for blurred vision, double vision, photophobia, pain and discharge.  Respiratory: Negative for cough, sputum production and shortness of breath.   Cardiovascular: Negative for chest pain, palpitations and leg swelling.  Gastrointestinal: Negative for diarrhea and constipation.  Genitourinary: Positive for dysuria and frequency.  Musculoskeletal: Positive for myalgias.  Skin: Negative.   Neurological: Positive for dizziness and weakness. Negative for headaches.     Past Medical History  Diagnosis Date  . Polymyalgia rheumatica   . Hypertension   . Hyperlipidemia   . Diabetic neuropathy   .  Osteomyelitis of foot, left, acute   . Anxiety   . Insomnia, unspecified   . Unspecified vitamin D deficiency   . Anemia, unspecified   . Other chronic postoperative pain   . Unspecified hereditary and idiopathic peripheral neuropathy   . Allergy   . Unspecified osteomyelitis, site unspecified   . Long term (current) use of anticoagulants   . 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 Ulcer     diabetic foot   . GERD (gastroesophageal reflux disease) 01/27/2009  . Diabetes mellitus 01/27/2009  . Arthralgia 2010    polyarticular  . Hemorrhoids, internal 10/2011    small  . Myocardial infarction     mild 1995  . Stroke 01/10/06    no residual at this ti,e  . Sleep apnea     hx of, had surgery  . CHF (congestive heart failure)   . CHF (congestive heart failure) 07/25/2009    denies  . Pneumonia     hx  . Chronic kidney disease     Followed by Dr Addison Naegeli   Past Surgical History  Procedure Laterality Date  . Amputation  01/21/2012    Procedure: AMPUTATION RAY;  Surgeon: [name], MD;  Location: [place];  Service: Orthopedics;  Laterality: Left;  Left Foot 4th Ray Amputation  . Cervical discectomy  02/2011    Fusion  . Arthroscopic knee  surgery Left 08-25-2012  . Amputation right foot digits 1-4    . Eye surgery Bilateral  [facility]  . Bascilic vein transposition Left 10/19/2012    Procedure: BASCILIC VEIN TRANSPOSITION;  Surgeon: Serafina Mitchell, MD;  Location: [place];  Service: Vascular;  Laterality: Left;  . Tonsillectomy    . Amputation Left 05/04/2013    Procedure: AMPUTATION DIGIT;  Surgeon: [name], MD;  Location: [place];  Service: Orthopedics;  Laterality: Left;  Left Great Toe Amputation at [facility]  . Toe surgery Left April 2015    Big toe removed on left foot.   Social History:   reports that he has quit smoking. His smoking use included Cigarettes. He has a 3.75 pack-year smoking history. He has never used smokeless tobacco. He reports that he  does not drink alcohol or use illicit drugs.  Family History  Problem Relation Age of Onset  . Hypertension Mother   . Cancer Mother 63    Ovarian  . Heart disease Maternal Aunt   . Stroke Maternal Grandfather     Medications: Patient's Medications  New Prescriptions   No medications on file  Previous Medications   AMITRIPTYLINE (ELAVIL) 50 MG TABLET    Take 1 tablet (50 mg total) by mouth at bedtime.   CALCIUM ACETATE (PHOSLO) 667 MG CAPSULE    Take 667 mg by mouth 3 (three) times daily with meals.   CHOLECALCIFEROL (VITAMIN D PO)    Take 1 tablet by mouth 3 (three) times a week. At dialysis   GABAPENTIN (NEURONTIN) 100 MG CAPSULE    Take 200 mg by mouth 2 (two) times daily.    GLIPIZIDE (GLUCOTROL) 5 MG TABLET    Take 1 tablet (5 mg total) by mouth 2 (two) times daily before a meal.   MULTIVITAMIN (RENA-VIT) TABS TABLET    Take 1 tablet by mouth daily.   RANITIDINE (ZANTAC) 150 MG TABLET    Take 150 mg by mouth daily as needed.    SILDENAFIL (VIAGRA) 25 MG TABLET    Take 2 tablets (50 mg total) by mouth daily as needed for erectile dysfunction.   ZOLPIDEM (AMBIEN) 10 MG TABLET    Take 10 mg by mouth at bedtime as needed for sleep.  Modified Medications   No medications on file  Discontinued Medications   No medications on file     Physical Exam:  Filed Vitals:   07/12/13 0825  BP: 170/100  Pulse: 105  Temp: 98.4 F (36.9 C)  TempSrc: Oral  Resp: 18  Height: 5\' 11"  (1.803 m)  Weight: 231 lb 3.2 oz (104.872 kg)  SpO2: 94%    Physical Exam  Constitutional: He is oriented to person, place, and time and well-developed, well-nourished, and in no distress.  HENT:  Mouth/Throat: Oropharynx is clear and moist. No oropharyngeal exudate.  Eyes: Conjunctivae and EOM are normal. Pupils are equal, round, and reactive to light.  Neck: Normal range of motion. Neck supple.  Cardiovascular: Normal rate, regular rhythm and normal heart sounds.   Pulmonary/Chest: Effort normal and  breath sounds normal. No respiratory distress. He has no wheezes. He has no rales. He exhibits no tenderness.  Abdominal: Soft. Bowel sounds are normal.  Musculoskeletal: He exhibits no edema and no tenderness.  Neurological: He is alert and oriented to person, place, and time.  Skin: Skin is warm and dry.  Psychiatric: Affect normal.     Labs reviewed: Basic Metabolic Panel:  Recent Labs  09/15/12 1030  05/02/13 0902 05/04/13 1251 06/25/13 1007  NA 140  < > 137 139 142  K  5.2  < > 4.7 4.2 5.3*  CL 102  --  96  --  101  CO2 20  --  29  --  24  GLUCOSE 76  < > 212* 162* 207*  BUN 62*  --  27*  --  52*  CREATININE 3.48*  --  3.32*  --  5.40*  CALCIUM 9.4  --  9.3  --  9.2  < > = values in this interval not displayed. Liver Function Tests:  Recent Labs  09/15/12 1030 05/02/13 0902 06/25/13 1007  AST 14 15 15   ALT 12 10 12   ALKPHOS 109 105 101  BILITOT 0.2 <0.2* <0.2  PROT 6.6 8.1 7.1  ALBUMIN  --  2.7*  --    No results found for this basename: LIPASE, AMYLASE,  in the last 8760 hours No results found for this basename: AMMONIA,  in the last 8760 hours CBC:  Recent Labs  09/15/12 1031  12/13/12 1219 05/02/13 0902 05/04/13 1251  WBC 11.2*  --  10.1 10.4  --   NEUTROABS 6.3  --  5.5  --   --   HGB 10.6*  < > 9.8* 10.7* 11.9*  HCT 31.5*  < > 28.8* 33.7* 35.0*  MCV 85  --  84 88.5  --   PLT  --   --   --  257  --   < > = values in this interval not displayed. Lipid Panel:  Recent Labs  09/15/12 1030  HDL 40  LDLCALC 113*  TRIG 377*  CHOLHDL 5.7*   TSH: No results found for this basename: TSH,  in the last 8760 hours A1C: Lab Results  Component Value Date   HGBA1C 6.7* 06/25/2013     Assessment/Plan  1. Fever, unspecified -pt with sick granddaughter at home (who is in daycare) plus complaints of dysuria and some tenderness across chest with deep breath -possible viral vs UTI vs pneumonia  -lung sound are clear and pt without shortness of  breath, congestion or cough, will get chest xray to rule out pneumonia  -pain to suprapubic area, will get UA C&S at this time -encouraged proper nutrition (pt on dialysis so fluid restriction in place)  - Urinalysis - Culture, Urine - DG Chest 2 View  2. Essential hypertension, benign -blood pressure elevated today but goes to dialysis today where he becomes hypotensive   3. Chronic kidney disease (CKD), stage IV (severe) -dialysis today, Rx given for CBC with diff and CMP to be drawn today and lab to be sent to [facility]   4. Dry eyes -may use OTC artifical tears PRN  Follow up precautions given and when to seek emergency treatment

## 2013-07-13 LAB — URINALYSIS
Bilirubin, UA: NEGATIVE
Ketones, UA: NEGATIVE
Leukocytes, UA: NEGATIVE
Nitrite, UA: NEGATIVE
RBC, UA: NEGATIVE
Specific Gravity, UA: 1.015 (ref 1.005–1.030)
UUROB: 0.2 mg/dL (ref 0.0–1.9)
pH, UA: 8 — ABNORMAL HIGH (ref 5.0–7.5)

## 2013-07-13 LAB — URINE CULTURE: ORGANISM ID, BACTERIA: NO GROWTH

## 2013-08-01 ENCOUNTER — Ambulatory Visit: Payer: Medicare HMO | Admitting: Internal Medicine

## 2013-08-01 DIAGNOSIS — Z0289 Encounter for other administrative examinations: Secondary | ICD-10-CM

## 2013-09-04 ENCOUNTER — Other Ambulatory Visit: Payer: Self-pay | Admitting: *Deleted

## 2013-09-04 DIAGNOSIS — E1149 Type 2 diabetes mellitus with other diabetic neurological complication: Secondary | ICD-10-CM

## 2013-09-28 ENCOUNTER — Other Ambulatory Visit: Payer: Self-pay | Admitting: *Deleted

## 2013-09-28 MED ORDER — GLIPIZIDE 5 MG PO TABS: 5.0000 mg | ORAL_TABLET | Freq: Two times a day (BID) | ORAL | Status: DC

## 2013-09-28 MED ORDER — GABAPENTIN 100 MG PO CAPS: 200.0000 mg | ORAL_CAPSULE | Freq: Two times a day (BID) | ORAL | Status: DC

## 2013-09-28 NOTE — Telephone Encounter (Signed)
Patient Requested 

## 2013-10-01 ENCOUNTER — Other Ambulatory Visit: Payer: Self-pay | Admitting: *Deleted

## 2013-10-01 MED ORDER — ZOLPIDEM TARTRATE 10 MG PO TABS: 10.0000 mg | ORAL_TABLET | Freq: Every evening | ORAL | Status: DC | PRN

## 2013-10-01 NOTE — Telephone Encounter (Signed)
Patient Requested for mail order

## 2013-10-22 ENCOUNTER — Other Ambulatory Visit: Payer: Commercial Managed Care - HMO

## 2013-10-22 DIAGNOSIS — E1149 Type 2 diabetes mellitus with other diabetic neurological complication: Secondary | ICD-10-CM

## 2013-10-23 LAB — HEMOGLOBIN A1C
Est. average glucose Bld gHb Est-mCnc: 146 mg/dL
Hgb A1c MFr Bld: 6.7 % — ABNORMAL HIGH (ref 4.8–5.6)

## 2013-10-24 ENCOUNTER — Ambulatory Visit (INDEPENDENT_AMBULATORY_CARE_PROVIDER_SITE_OTHER): Payer: Commercial Managed Care - HMO | Admitting: Internal Medicine

## 2013-10-24 ENCOUNTER — Encounter: Payer: Self-pay | Admitting: Internal Medicine

## 2013-10-24 VITALS — BP 148/80 | HR 105 | Temp 99.0°F | Resp 18 | Ht 71.0 in | Wt 228.4 lb

## 2013-10-24 DIAGNOSIS — E1129 Type 2 diabetes mellitus with other diabetic kidney complication: Secondary | ICD-10-CM

## 2013-10-24 DIAGNOSIS — E1121 Type 2 diabetes mellitus with diabetic nephropathy: Secondary | ICD-10-CM

## 2013-10-24 DIAGNOSIS — N5201 Erectile dysfunction due to arterial insufficiency: Secondary | ICD-10-CM

## 2013-10-24 DIAGNOSIS — E1143 Type 2 diabetes mellitus with diabetic autonomic (poly)neuropathy: Secondary | ICD-10-CM

## 2013-10-24 DIAGNOSIS — K219 Gastro-esophageal reflux disease without esophagitis: Secondary | ICD-10-CM

## 2013-10-24 MED ORDER — GLIPIZIDE 5 MG PO TABS: 5.0000 mg | ORAL_TABLET | Freq: Every day | ORAL | Status: DC

## 2013-10-24 NOTE — Progress Notes (Signed)
[name], male   DOB: [birth year], 53 y.o.   MRN: [id number]    Chief Complaint  Patient presents with  . Medical Management of Chronic Issues   Allergies  Allergen Reactions  . Morphine And Related     hallucinations  . Tygacil [Tigecycline] Nausea And Vomiting, Nausea Only and Other (See Comments)   HPI 53 y/o male patient is here for routine follow up. 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 bp slightly elevated this visit. He has been off all bp medication given his orthostasis especially post dialysis. Mood has been stable. Nerve pain has been bearable with current regimen gabapentin. cbg ranging between 90-140 with one reading in 200. Reflux under control, has not required ranitidine recently. Sleeping ok at night. Takes ambien 2-3 times a week. viagra has been helpful but with cost issue, unable to obtain  Has neck pain 5-10 minutes of standing going to his arms and back for several months, has had cervical fusion in the past. Has seen back surgeon in the past and feels like he needs to see them again  Review of Systems   Constitutional: Negative for fever, chills, weight loss, malaise/fatigue.  HENT: negative for runny nose, cough, sore throat Eyes: Negative for blurred vision, double vision and photophobia.   Respiratory: Negative for shortness of breath.    Cardiovascular: lNegative for chest pain, palpitations, orthopnea and claudication.  no more leg edema Gastrointestinal: Negative for heartburn, nausea, vomiting and abdominal pain.   Skin: Negative for itching and rash.   Neurological: Negative for dizziness, focal weakness, seizures, loss of consciousness, weakness and headaches.   Musculoskeletal: no falls. Has PMR and ocassional joint aches    Physical exam BP 148/80  Pulse 105  Temp(Src) 99 F (37.2 C) (Oral)  Resp 18  Ht 5\' 11"  (1.803 m)  Wt 228 lb 6.4 oz (103.602 kg)  BMI 31.87 kg/m2  SpO2 98%  Wt  Readings from Last 3 Encounters:  10/24/13 228 lb 6.4 oz (103.602 kg)  07/12/13 231 lb 3.2 oz (104.872 kg)  06/27/13 227 lb 12.8 oz (103.329 kg)   Constitutional: He is oriented to person, place, and time. He appears well-developed and well-nourished. No distress.   Head: Normocephalic and atraumatic.  Nose: Nose normal.   Neck: Normal range of motion. Neck supple. No JVD present. No tracheal deviation present. No thyromegaly present.   Cardiovascular: Normal rate, regular rhythm, normal heart sounds and intact distal pulses.    No murmur heard. Pulmonary/Chest: Effort normal and breath sounds normal. No respiratory distress. He has no wheezes. He has no rales. He exhibits no tenderness.   Abdominal: Soft. Bowel sounds are normal.  Musculoskeletal: Normal range of motion. No edema. Left 4th toe amputated, scar tissue well healed, right toe 2 amputated, has c3-c6 fusion Lymphadenopathy: He has no cervical adenopathy.  Neurological: He is alert and oriented to person, place, and time.  Skin: Skin is warm and dry. No rash noted. He is not diaphoretic. Has AV fistula in left arm with good thrill Psychiatric: He has a normal mood and affect. His behavior is normal. Judgment and thought content normal.   Labs-  Lab Results  Component Value Date   HGBA1C 6.7* 10/22/2013   Lipid Panel     Component Value Date/Time   TRIG 377* 09/15/2012 1030   HDL 40 09/15/2012 1030   CHOLHDL 5.7* 09/15/2012 1030   LDLCALC 113* 09/15/2012 1030   Assessment/plan  1. Diabetes mellitus with renal manifestations, controlled Well controlled dm, continue glipizide, monitor cbg. Off ACEI with impaired renal function. Lipid panel prior to next visit.  2. Gastroesophageal reflux disease, esophagitis presence not specified Stable on prn ranitidine  3. Diabetic autonomic neuropathy associated with type 2 diabetes mellitus Continue gabapentin current regimen  4. Erectile dysfunction due to arterial insufficiency Has  history of HTN, DM and CKD. Normal testosterone. Continue viagra 50 mg daily as needed for now

## 2013-12-28 ENCOUNTER — Other Ambulatory Visit: Payer: Self-pay | Admitting: *Deleted

## 2013-12-28 MED ORDER — ZOLPIDEM TARTRATE 10 MG PO TABS: 10.0000 mg | ORAL_TABLET | Freq: Every evening | ORAL | Status: DC | PRN

## 2013-12-28 NOTE — Telephone Encounter (Signed)
[facility] 

## 2014-01-24 ENCOUNTER — Encounter (HOSPITAL_COMMUNITY): Payer: Self-pay | Admitting: Surgery

## 2014-02-27 ENCOUNTER — Other Ambulatory Visit (HOSPITAL_COMMUNITY): Payer: Self-pay | Admitting: Orthopedic Surgery

## 2014-02-28 ENCOUNTER — Other Ambulatory Visit: Payer: Self-pay | Admitting: Orthopedic Surgery

## 2014-02-28 DIAGNOSIS — M25562 Pain in left knee: Secondary | ICD-10-CM

## 2014-03-07 ENCOUNTER — Ambulatory Visit
Admission: RE | Admit: 2014-03-07 | Discharge: 2014-03-07 | Disposition: A | Discharge status: Home / Self Care | Payer: Commercial Managed Care - HMO | Source: Ambulatory Visit | Attending: Orthopedic Surgery | Admitting: Orthopedic Surgery

## 2014-03-07 DIAGNOSIS — M25562 Pain in left knee: Secondary | ICD-10-CM

## 2014-03-28 NOTE — Pre-Procedure Instructions (Signed)
Matthew Kline  03/28/2014   Your procedure is scheduled on:  March 30  @830   Report to [facility] Admitting at 630 AM.  Call this number if you have problems the morning of surgery: [phone]   Remember:   Do not eat food or drink liquids after midnight.    Take these medicines the morning of surgery with A SIP OF WATER: amlodipine (Norvasc), Gabapentin (Neurotin), ranitidine (Zantac)  Stop taking Aspirin, Ibuprofen, Aleve, BC's, Goody's, Herbal medications, and Fish Oil   Do not wear jewelry, make-up or nail polish.  Do not wear lotions, powders, or perfumes. You may wear deodorant.  Do not shave 48 hours prior to surgery. Men may shave face and neck.  Do not bring valuables to the hospital.  [facility] is not responsible for any belongings or valuables.               Contacts, dentures or bridgework may not be worn into surgery.  Leave suitcase in the car. After surgery it may be brought to your room.  For patients admitted to the hospital, discharge time is determined by your treatment team.               Patients discharged the day of surgery will not be allowed to drive home.    Special Instructions:  - Preparing for Surgery  Before surgery, you can play an important role.  Because skin is not sterile, your skin needs to be as free of germs as possible.  You can reduce the number of germs on you skin by washing with CHG (chlorahexidine gluconate) soap before surgery.  CHG is an antiseptic cleaner which kills germs and bonds with the skin to continue killing germs even after washing.  Please DO NOT use if you have an allergy to CHG or antibacterial soaps.  If your skin becomes reddened/irritated stop using the CHG and inform your nurse when you arrive at Short Stay.  Do not shave (including legs and underarms) for at least 48 hours prior to the first CHG shower.  You may shave your face.  Please follow these instructions carefully:   1.  Shower  with CHG Soap the night before surgery and the                                morning of Surgery.  2.  If you choose to wash your hair, wash your hair first as usual with your       normal shampoo.  3.  After you shampoo, rinse your hair and body thoroughly to remove the                      Shampoo.  4.  Use CHG as you would any other liquid soap.  You can apply chg directly       to the skin and wash gently with scrungie or a clean washcloth.  5.  Apply the CHG Soap to your body ONLY FROM THE NECK DOWN.        Do not use on open wounds or open sores.  Avoid contact with your eyes,       ears, mouth and genitals (private parts).  Wash genitals (private parts)       with your normal soap.  6.  Wash thoroughly, paying special attention to the area where your surgery  will be performed.  7.  Thoroughly rinse your body with warm water from the neck down.  8.  DO NOT shower/wash with your normal soap after using and rinsing off       the CHG Soap.  9.  Pat yourself dry with a clean towel.            10.  Wear clean pajamas.            11.  Place clean sheets on your bed the night of your first shower and do not        sleep with pets.  Day of Surgery  Do not apply any lotions/deoderants the morning of surgery.  Please wear clean clothes to the hospital/surgery center.      Please read over the following fact sheets that you were given: Pain Booklet, Coughing and Deep Breathing, MRSA Information and Surgical Site Infection Prevention

## 2014-03-29 ENCOUNTER — Encounter (HOSPITAL_COMMUNITY)
Admission: RE | Admit: 2014-03-29 | Discharge: 2014-03-29 | Disposition: A | Discharge status: Home / Self Care | Payer: Commercial Managed Care - HMO | Source: Ambulatory Visit | Attending: Orthopedic Surgery | Admitting: Orthopedic Surgery

## 2014-03-29 ENCOUNTER — Ambulatory Visit (HOSPITAL_COMMUNITY)
Admission: RE | Admit: 2014-03-29 | Discharge: 2014-03-29 | Disposition: A | Discharge status: Home / Self Care | Payer: Commercial Managed Care - HMO | Source: Ambulatory Visit | Attending: Orthopedic Surgery | Admitting: Orthopedic Surgery

## 2014-03-29 ENCOUNTER — Encounter (HOSPITAL_COMMUNITY): Payer: Self-pay

## 2014-03-29 DIAGNOSIS — Z01810 Encounter for preprocedural cardiovascular examination: Secondary | ICD-10-CM | POA: Diagnosis not present

## 2014-03-29 DIAGNOSIS — Z01812 Encounter for preprocedural laboratory examination: Secondary | ICD-10-CM | POA: Insufficient documentation

## 2014-03-29 DIAGNOSIS — I10 Essential (primary) hypertension: Secondary | ICD-10-CM | POA: Diagnosis not present

## 2014-03-29 DIAGNOSIS — Z01818 Encounter for other preprocedural examination: Secondary | ICD-10-CM | POA: Insufficient documentation

## 2014-03-29 DIAGNOSIS — M1712 Unilateral primary osteoarthritis, left knee: Secondary | ICD-10-CM

## 2014-03-29 HISTORY — DX: Dependence on renal dialysis: Z99.2

## 2014-03-29 LAB — CBC
HEMATOCRIT: 38.2 % — AB (ref 39.0–52.0)
HEMOGLOBIN: 12.5 g/dL — AB (ref 13.0–17.0)
MCH: 29.6 pg (ref 26.0–34.0)
MCHC: 32.7 g/dL (ref 30.0–36.0)
MCV: 90.3 fL (ref 78.0–100.0)
Platelets: 243 10*3/uL (ref 150–400)
RBC: 4.23 MIL/uL (ref 4.22–5.81)
RDW: 13.7 % (ref 11.5–15.5)
WBC: 7.8 10*3/uL (ref 4.0–10.5)

## 2014-03-29 LAB — COMPREHENSIVE METABOLIC PANEL
ALT: 17 U/L (ref 0–53)
ANION GAP: 10 (ref 5–15)
AST: 22 U/L (ref 0–37)
Albumin: 3.6 g/dL (ref 3.5–5.2)
Alkaline Phosphatase: 78 U/L (ref 39–117)
BILIRUBIN TOTAL: 0.5 mg/dL (ref 0.3–1.2)
BUN: 24 mg/dL — AB (ref 6–23)
CHLORIDE: 101 mmol/L (ref 96–112)
CO2: 28 mmol/L (ref 19–32)
CREATININE: 4.88 mg/dL — AB (ref 0.50–1.35)
Calcium: 9.6 mg/dL (ref 8.4–10.5)
GFR calc Af Amer: 14 mL/min — ABNORMAL LOW (ref >90)
GFR, EST NON AFRICAN AMERICAN: 12 mL/min — AB (ref >90)
Glucose, Bld: 111 mg/dL — ABNORMAL HIGH (ref 70–99)
Potassium: 3.8 mmol/L (ref 3.5–5.1)
Sodium: 139 mmol/L (ref 135–145)
Total Protein: 8.3 g/dL (ref 6.0–8.3)

## 2014-03-29 LAB — SURGICAL PCR SCREEN
MRSA, PCR: NEGATIVE
Staphylococcus aureus: NEGATIVE

## 2014-03-29 LAB — PROTIME-INR
INR: 1.01 (ref 0.00–1.49)
Prothrombin Time: 13.5 seconds (ref 11.6–15.2)

## 2014-03-29 LAB — APTT: aPTT: 31 seconds (ref 24–37)

## 2014-03-29 NOTE — Progress Notes (Signed)
Pt not here for 0800 PAT appointment, called him and he asks if he can come in later. Infomred him he could come before 0900. Voices understanding.

## 2014-03-29 NOTE — Progress Notes (Addendum)
PCP is Dr Guinevere Ferrari [facility] Dr is Dr Justin Mend Denies seeing a cardiologist. Echo and stress test in care everywhere from 2015 done a [facility].  Reports his  Fasting cbg's run 102-112

## 2014-04-01 ENCOUNTER — Encounter (HOSPITAL_COMMUNITY): Payer: Self-pay

## 2014-04-01 NOTE — Progress Notes (Signed)
Anesthesia chart review: Patient is a 54 year old male scheduled for left TKA on 04/10/14 by Dr. Sharol Given. Case is posted for spinal anesthesia.  History includes smoking, hypertension, diabetes mellitus type 2 with neuropathy, OSA treated with surgery, end-stage renal disease on hemodialysis with left basilic vein transposition AVF, polymyalgia rheumatica, hyperlipidemia, anxiety, insomnia, anemia, "mild" MI '95, congestive heart failure (patient denied; may have been in context of ARF prior to starting HD), lacunar infarct 2006, GERD, previous left and right toe amputations and left great toe amputation 05/04/13, cervical discectomy. PCP is Dr. Bubba Camp. Nephrologist is Dr. Justin Mend.  Meds include amitriptyline, amlodipine, Phoslo, Vitamin D, gabapentin, glipizide, ranitidine, Viagra, Ambien.  Care Everywhere: He had a normal stress echo on 04/18/13 at [facility]. Echo on 04/18/13 [facility]) showed: Normal left ventricular systolic function. EF > 55%. Normal right ventricular systolic function. Trivial tricuspid regurgitation. No valvular stenosis.  03/29/14 EKG: NSR.  03/29/14 CXR: Bibasilar scarring and/or atelectasis not significantly different from prior study on 07/12/13.  Preoperative labs noted. He will get an ISTAT on arrival. If results are acceptable then I would anticipate that he could proceed as planned.  Patient with normal stress echo less than one year ago.  EKG is normal.  If ISTAT results acceptable and otherwise no acute changes then I would anticipate that he could proceed as planned.  Definitive anesthesia plan following anesthesiologist evaluation on the day of surgery.  George [facility]/Anesthesiology Phone [phone] 04/01/2014 10:51 AM

## 2014-04-09 MED ORDER — CHLORHEXIDINE GLUCONATE 4 % EX LIQD
60.0000 mL | Freq: Once | CUTANEOUS | Status: DC
  Filled 2014-04-09: qty 60

## 2014-04-09 MED ORDER — CEFAZOLIN SODIUM-DEXTROSE 2-3 GM-% IV SOLR
2.0000 g | INTRAVENOUS | Status: AC
  Administered 2014-04-10: 2 g via INTRAVENOUS
  Filled 2014-04-09: qty 50

## 2014-04-09 NOTE — Anesthesia Preprocedure Evaluation (Addendum)
Anesthesia Evaluation  Patient identified by MRN, date of birth, ID band Patient awake    Reviewed: Allergy & Precautions, H&P , NPO status , Patient's Chart, lab work & pertinent test results, reviewed documented beta blocker date and time   Airway Mallampati: II   Neck ROM: full    Dental  (+) Dental Advisory Given, Poor Dentition   Pulmonary neg pulmonary ROS, sleep apnea , pneumonia -, Current Smoker,  breath sounds clear to auscultation        Cardiovascular hypertension, + CAD, + Past MI, + Peripheral Vascular Disease and +CHF Rhythm:Regular     Neuro/Psych PSYCHIATRIC DISORDERS Anxiety Depression  Neuromuscular disease CVA, No Residual Symptoms    GI/Hepatic GERD-  Medicated and Controlled,  Endo/Other  diabetes, Type 2, Oral Hypoglycemic Agentsobese  Renal/GU ESRFRenal disease     Musculoskeletal  (+) Arthritis -,   Abdominal (+)  Abdomen: soft. Bowel sounds: normal.  Peds  Hematology  (+) anemia ,   Anesthesia Other Findings   Reproductive/Obstetrics                            Anesthesia Physical  Anesthesia Plan  ASA: III  Anesthesia Plan: Regional   Post-op Pain Management:    Induction:   Airway Management Planned:   Additional Equipment:   Intra-op Plan:   Post-operative Plan:   Informed Consent: I have reviewed the patients History and Physical, chart, labs and discussed the procedure including the risks, benefits and alternatives for the proposed anesthesia with the patient or authorized representative who has indicated his/her understanding and acceptance.   Dental advisory given  Plan Discussed with: CRNA  Anesthesia Plan Comments:        Anesthesia Quick Evaluation

## 2014-04-10 ENCOUNTER — Inpatient Hospital Stay (HOSPITAL_COMMUNITY): Payer: Commercial Managed Care - HMO | Admitting: Vascular Surgery

## 2014-04-10 ENCOUNTER — Encounter (HOSPITAL_COMMUNITY)
Admission: RE | Disposition: A | Discharge status: Home Health | Payer: Self-pay | Source: Ambulatory Visit | Attending: Orthopedic Surgery

## 2014-04-10 ENCOUNTER — Encounter (HOSPITAL_COMMUNITY): Payer: Self-pay | Admitting: Surgery

## 2014-04-10 ENCOUNTER — Inpatient Hospital Stay (HOSPITAL_COMMUNITY): Payer: Commercial Managed Care - HMO | Admitting: Anesthesiology

## 2014-04-10 ENCOUNTER — Inpatient Hospital Stay (HOSPITAL_COMMUNITY)
Admission: RE | Admit: 2014-04-10 | Discharge: 2014-04-13 | DRG: 469 | Disposition: A | Discharge status: Home Health | Payer: Commercial Managed Care - HMO | Source: Ambulatory Visit | Attending: Orthopedic Surgery | Admitting: Orthopedic Surgery

## 2014-04-10 DIAGNOSIS — Z89421 Acquired absence of other right toe(s): Secondary | ICD-10-CM

## 2014-04-10 DIAGNOSIS — N186 End stage renal disease: Secondary | ICD-10-CM | POA: Diagnosis present

## 2014-04-10 DIAGNOSIS — Z992 Dependence on renal dialysis: Secondary | ICD-10-CM

## 2014-04-10 DIAGNOSIS — F17210 Nicotine dependence, cigarettes, uncomplicated: Secondary | ICD-10-CM | POA: Diagnosis present

## 2014-04-10 DIAGNOSIS — E559 Vitamin D deficiency, unspecified: Secondary | ICD-10-CM | POA: Diagnosis present

## 2014-04-10 DIAGNOSIS — E1140 Type 2 diabetes mellitus with diabetic neuropathy, unspecified: Secondary | ICD-10-CM | POA: Diagnosis present

## 2014-04-10 DIAGNOSIS — F329 Major depressive disorder, single episode, unspecified: Secondary | ICD-10-CM | POA: Diagnosis present

## 2014-04-10 DIAGNOSIS — Z8701 Personal history of pneumonia (recurrent): Secondary | ICD-10-CM

## 2014-04-10 DIAGNOSIS — N529 Male erectile dysfunction, unspecified: Secondary | ICD-10-CM | POA: Diagnosis present

## 2014-04-10 DIAGNOSIS — E785 Hyperlipidemia, unspecified: Secondary | ICD-10-CM | POA: Diagnosis present

## 2014-04-10 DIAGNOSIS — Z981 Arthrodesis status: Secondary | ICD-10-CM | POA: Diagnosis not present

## 2014-04-10 DIAGNOSIS — Z89412 Acquired absence of left great toe: Secondary | ICD-10-CM | POA: Diagnosis not present

## 2014-04-10 DIAGNOSIS — F419 Anxiety disorder, unspecified: Secondary | ICD-10-CM | POA: Diagnosis present

## 2014-04-10 DIAGNOSIS — Z7901 Long term (current) use of anticoagulants: Secondary | ICD-10-CM | POA: Diagnosis not present

## 2014-04-10 DIAGNOSIS — Z89422 Acquired absence of other left toe(s): Secondary | ICD-10-CM | POA: Diagnosis not present

## 2014-04-10 DIAGNOSIS — Z89411 Acquired absence of right great toe: Secondary | ICD-10-CM | POA: Diagnosis not present

## 2014-04-10 DIAGNOSIS — K219 Gastro-esophageal reflux disease without esophagitis: Secondary | ICD-10-CM | POA: Diagnosis present

## 2014-04-10 DIAGNOSIS — M1712 Unilateral primary osteoarthritis, left knee: Secondary | ICD-10-CM | POA: Diagnosis present

## 2014-04-10 DIAGNOSIS — Z881 Allergy status to other antibiotic agents status: Secondary | ICD-10-CM | POA: Diagnosis not present

## 2014-04-10 DIAGNOSIS — D631 Anemia in chronic kidney disease: Secondary | ICD-10-CM | POA: Diagnosis present

## 2014-04-10 DIAGNOSIS — I509 Heart failure, unspecified: Secondary | ICD-10-CM | POA: Diagnosis present

## 2014-04-10 DIAGNOSIS — G4700 Insomnia, unspecified: Secondary | ICD-10-CM | POA: Diagnosis present

## 2014-04-10 DIAGNOSIS — E1151 Type 2 diabetes mellitus with diabetic peripheral angiopathy without gangrene: Secondary | ICD-10-CM | POA: Diagnosis present

## 2014-04-10 DIAGNOSIS — I120 Hypertensive chronic kidney disease with stage 5 chronic kidney disease or end stage renal disease: Secondary | ICD-10-CM | POA: Diagnosis present

## 2014-04-10 DIAGNOSIS — E1122 Type 2 diabetes mellitus with diabetic chronic kidney disease: Secondary | ICD-10-CM | POA: Diagnosis present

## 2014-04-10 DIAGNOSIS — I252 Old myocardial infarction: Secondary | ICD-10-CM

## 2014-04-10 DIAGNOSIS — Z96652 Presence of left artificial knee joint: Secondary | ICD-10-CM

## 2014-04-10 DIAGNOSIS — Z8673 Personal history of transient ischemic attack (TIA), and cerebral infarction without residual deficits: Secondary | ICD-10-CM

## 2014-04-10 DIAGNOSIS — M47892 Other spondylosis, cervical region: Secondary | ICD-10-CM | POA: Diagnosis present

## 2014-04-10 DIAGNOSIS — M353 Polymyalgia rheumatica: Secondary | ICD-10-CM | POA: Diagnosis present

## 2014-04-10 DIAGNOSIS — Z96659 Presence of unspecified artificial knee joint: Secondary | ICD-10-CM

## 2014-04-10 DIAGNOSIS — Z885 Allergy status to narcotic agent status: Secondary | ICD-10-CM

## 2014-04-10 HISTORY — PX: TOTAL KNEE ARTHROPLASTY: SHX125

## 2014-04-10 LAB — GLUCOSE, CAPILLARY
Glucose-Capillary: 94 mg/dL (ref 70–99)
Glucose-Capillary: 190 mg/dL — ABNORMAL HIGH (ref 70–99)

## 2014-04-10 LAB — POCT I-STAT 4, (NA,K, GLUC, HGB,HCT)
GLUCOSE: 114 mg/dL — AB (ref 70–99)
HEMATOCRIT: 36 % — AB (ref 39.0–52.0)
Hemoglobin: 12.2 g/dL — ABNORMAL LOW (ref 13.0–17.0)
Potassium: 4.3 mmol/L (ref 3.5–5.1)
Sodium: 140 mmol/L (ref 135–145)

## 2014-04-10 LAB — BASIC METABOLIC PANEL
Anion gap: 13 (ref 5–15)
BUN: 32 mg/dL — ABNORMAL HIGH (ref 6–23)
CO2: 25 mmol/L (ref 19–32)
Calcium: 8.8 mg/dL (ref 8.4–10.5)
Chloride: 101 mmol/L (ref 96–112)
Creatinine, Ser: 5.4 mg/dL — ABNORMAL HIGH (ref 0.50–1.35)
GFR calc Af Amer: 12 mL/min — ABNORMAL LOW (ref >90)
GFR calc non Af Amer: 11 mL/min — ABNORMAL LOW (ref >90)
Glucose, Bld: 147 mg/dL — ABNORMAL HIGH (ref 70–99)
Potassium: 4.3 mmol/L (ref 3.5–5.1)
Sodium: 139 mmol/L (ref 135–145)

## 2014-04-10 LAB — CBC
HCT: 31.9 % — ABNORMAL LOW (ref 39.0–52.0)
Hemoglobin: 10.3 g/dL — ABNORMAL LOW (ref 13.0–17.0)
MCH: 28.9 pg (ref 26.0–34.0)
MCHC: 32.3 g/dL (ref 30.0–36.0)
MCV: 89.6 fL (ref 78.0–100.0)
Platelets: 182 10*3/uL (ref 150–400)
RBC: 3.56 MIL/uL — ABNORMAL LOW (ref 4.22–5.81)
RDW: 13.8 % (ref 11.5–15.5)
WBC: 9.7 10*3/uL (ref 4.0–10.5)

## 2014-04-10 SURGERY — ARTHROPLASTY, KNEE, TOTAL
Anesthesia: Regional | Site: Knee | Laterality: Left

## 2014-04-10 MED ORDER — MIDAZOLAM HCL 5 MG/5ML IJ SOLN
INTRAMUSCULAR | Status: DC | PRN
  Administered 2014-04-10: 2 mg via INTRAVENOUS

## 2014-04-10 MED ORDER — HYDROMORPHONE HCL 1 MG/ML IJ SOLN
INTRAMUSCULAR | Status: AC
  Administered 2014-04-10: 1 mg via INTRAVENOUS
  Filled 2014-04-10: qty 1

## 2014-04-10 MED ORDER — MENTHOL 3 MG MT LOZG: 1.0000 | LOZENGE | OROMUCOSAL | Status: DC | PRN

## 2014-04-10 MED ORDER — PHENOL 1.4 % MT LIQD: 1.0000 | OROMUCOSAL | Status: DC | PRN

## 2014-04-10 MED ORDER — LIDOCAINE HCL (CARDIAC) 20 MG/ML IV SOLN
INTRAVENOUS | Status: DC | PRN
  Administered 2014-04-10: 40 mg via INTRAVENOUS

## 2014-04-10 MED ORDER — SENNOSIDES-DOCUSATE SODIUM 8.6-50 MG PO TABS: 1.0000 | ORAL_TABLET | Freq: Every evening | ORAL | Status: DC | PRN

## 2014-04-10 MED ORDER — ASPIRIN EC 325 MG PO TBEC
325.0000 mg | DELAYED_RELEASE_TABLET | Freq: Every day | ORAL | Status: DC
  Administered 2014-04-11 – 2014-04-13 (×2): 325 mg via ORAL
  Filled 2014-04-10 (×2): qty 1

## 2014-04-10 MED ORDER — SODIUM CHLORIDE 0.9 % IV SOLN
INTRAVENOUS | Status: DC | PRN
  Administered 2014-04-10: 08:00:00 via INTRAVENOUS

## 2014-04-10 MED ORDER — DOXERCALCIFEROL 4 MCG/2ML IV SOLN
2.0000 ug | INTRAVENOUS | Status: DC
  Administered 2014-04-10 – 2014-04-12 (×2): 2 ug via INTRAVENOUS
  Filled 2014-04-10: qty 2

## 2014-04-10 MED ORDER — KETOROLAC TROMETHAMINE 15 MG/ML IJ SOLN
INTRAMUSCULAR | Status: AC
  Administered 2014-04-10: 15 mg via INTRAVENOUS
  Filled 2014-04-10: qty 1

## 2014-04-10 MED ORDER — ZOLPIDEM TARTRATE 5 MG PO TABS
10.0000 mg | ORAL_TABLET | Freq: Every evening | ORAL | Status: DC | PRN
  Administered 2014-04-11: 10 mg via ORAL
  Filled 2014-04-10: qty 2

## 2014-04-10 MED ORDER — AMITRIPTYLINE HCL 50 MG PO TABS
50.0000 mg | ORAL_TABLET | Freq: Every day | ORAL | Status: DC
  Administered 2014-04-10 – 2014-04-12 (×3): 50 mg via ORAL
  Filled 2014-04-10: qty 1
  Filled 2014-04-10: qty 2
  Filled 2014-04-10: qty 1

## 2014-04-10 MED ORDER — PROPOFOL 10 MG/ML IV BOLUS
INTRAVENOUS | Status: DC | PRN
  Administered 2014-04-10: 20 mg via INTRAVENOUS

## 2014-04-10 MED ORDER — BUPIVACAINE IN DEXTROSE 0.75-8.25 % IT SOLN
INTRATHECAL | Status: DC | PRN
  Administered 2014-04-10: 2 mL via INTRATHECAL

## 2014-04-10 MED ORDER — KETOROLAC TROMETHAMINE 15 MG/ML IJ SOLN
15.0000 mg | Freq: Four times a day (QID) | INTRAMUSCULAR | Status: AC
  Administered 2014-04-10 – 2014-04-11 (×3): 15 mg via INTRAVENOUS
  Filled 2014-04-10 (×2): qty 1

## 2014-04-10 MED ORDER — SODIUM CHLORIDE 0.9 % IR SOLN
Status: DC | PRN
  Administered 2014-04-10: 1000 mL
  Administered 2014-04-10: 3000 mL

## 2014-04-10 MED ORDER — DOCUSATE SODIUM 100 MG PO CAPS
100.0000 mg | ORAL_CAPSULE | Freq: Two times a day (BID) | ORAL | Status: DC
  Administered 2014-04-12 – 2014-04-13 (×3): 100 mg via ORAL
  Filled 2014-04-10 (×6): qty 1

## 2014-04-10 MED ORDER — PROPOFOL INFUSION 10 MG/ML OPTIME
INTRAVENOUS | Status: DC | PRN
  Administered 2014-04-10: 10:00:00 via INTRAVENOUS
  Administered 2014-04-10: 75 ug/kg/min via INTRAVENOUS

## 2014-04-10 MED ORDER — ACETAMINOPHEN 325 MG PO TABS
650.0000 mg | ORAL_TABLET | Freq: Four times a day (QID) | ORAL | Status: DC | PRN
  Filled 2014-04-10: qty 2

## 2014-04-10 MED ORDER — SODIUM CHLORIDE 0.9 % IV SOLN
62.5000 mg | INTRAVENOUS | Status: DC
  Administered 2014-04-10: 62.5 mg via INTRAVENOUS
  Filled 2014-04-10: qty 5

## 2014-04-10 MED ORDER — CALCIUM ACETATE 667 MG PO CAPS: 667.0000 mg | ORAL_CAPSULE | Freq: Three times a day (TID) | ORAL | Status: DC

## 2014-04-10 MED ORDER — SODIUM CHLORIDE 0.9 % IV SOLN
INTRAVENOUS | Status: DC
  Administered 2014-04-10 – 2014-04-12 (×2): via INTRAVENOUS

## 2014-04-10 MED ORDER — ACETAMINOPHEN 650 MG RE SUPP: 650.0000 mg | Freq: Four times a day (QID) | RECTAL | Status: DC | PRN

## 2014-04-10 MED ORDER — FENTANYL CITRATE 0.05 MG/ML IJ SOLN
INTRAMUSCULAR | Status: DC | PRN
  Administered 2014-04-10 (×2): 50 ug via INTRAVENOUS

## 2014-04-10 MED ORDER — GABAPENTIN 100 MG PO CAPS
200.0000 mg | ORAL_CAPSULE | Freq: Two times a day (BID) | ORAL | Status: DC
  Administered 2014-04-10 – 2014-04-13 (×6): 200 mg via ORAL
  Filled 2014-04-10 (×6): qty 2

## 2014-04-10 MED ORDER — GLIPIZIDE 5 MG PO TABS
5.0000 mg | ORAL_TABLET | Freq: Every day | ORAL | Status: DC
  Administered 2014-04-11 – 2014-04-13 (×2): 5 mg via ORAL
  Filled 2014-04-10 (×2): qty 1

## 2014-04-10 MED ORDER — ONDANSETRON HCL 4 MG/2ML IJ SOLN
INTRAMUSCULAR | Status: DC | PRN
  Administered 2014-04-10: 4 mg via INTRAVENOUS

## 2014-04-10 MED ORDER — CEFAZOLIN SODIUM 1-5 GM-% IV SOLN
1.0000 g | Freq: Four times a day (QID) | INTRAVENOUS | Status: AC
  Administered 2014-04-10: 1 g via INTRAVENOUS
  Filled 2014-04-10 (×2): qty 50

## 2014-04-10 MED ORDER — BISACODYL 5 MG PO TBEC: 5.0000 mg | DELAYED_RELEASE_TABLET | Freq: Every day | ORAL | Status: DC | PRN

## 2014-04-10 MED ORDER — PROPOFOL 10 MG/ML IV BOLUS
INTRAVENOUS | Status: AC
  Filled 2014-04-10: qty 20

## 2014-04-10 MED ORDER — MAGNESIUM CITRATE PO SOLN: 1.0000 | Freq: Once | ORAL | Status: AC | PRN

## 2014-04-10 MED ORDER — MEPERIDINE HCL 25 MG/ML IJ SOLN: 6.2500 mg | INTRAMUSCULAR | Status: DC | PRN

## 2014-04-10 MED ORDER — DOXERCALCIFEROL 4 MCG/2ML IV SOLN
INTRAVENOUS | Status: AC
  Administered 2014-04-10: 2 ug via INTRAVENOUS
  Filled 2014-04-10: qty 2

## 2014-04-10 MED ORDER — HYDROMORPHONE HCL 1 MG/ML IJ SOLN
1.0000 mg | INTRAMUSCULAR | Status: DC | PRN
  Administered 2014-04-10 – 2014-04-13 (×12): 1 mg via INTRAVENOUS
  Filled 2014-04-10 (×8): qty 1

## 2014-04-10 MED ORDER — CALCIUM ACETATE (PHOS BINDER) 667 MG PO CAPS
667.0000 mg | ORAL_CAPSULE | Freq: Three times a day (TID) | ORAL | Status: DC
  Administered 2014-04-10 – 2014-04-13 (×8): 667 mg via ORAL
  Filled 2014-04-10 (×8): qty 1

## 2014-04-10 MED ORDER — PROMETHAZINE HCL 25 MG/ML IJ SOLN: 6.2500 mg | INTRAMUSCULAR | Status: DC | PRN

## 2014-04-10 MED ORDER — FENTANYL CITRATE 0.05 MG/ML IJ SOLN
INTRAMUSCULAR | Status: AC
  Filled 2014-04-10: qty 5

## 2014-04-10 MED ORDER — RENA-VITE PO TABS
1.0000 | ORAL_TABLET | Freq: Every day | ORAL | Status: DC
  Administered 2014-04-10 – 2014-04-12 (×3): 1 via ORAL
  Filled 2014-04-10 (×3): qty 1

## 2014-04-10 MED ORDER — OXYCODONE HCL 5 MG PO TABS
5.0000 mg | ORAL_TABLET | ORAL | Status: DC | PRN
  Administered 2014-04-10 – 2014-04-13 (×10): 10 mg via ORAL
  Filled 2014-04-10 (×10): qty 2

## 2014-04-10 MED ORDER — HYDROMORPHONE HCL 1 MG/ML IJ SOLN
INTRAMUSCULAR | Status: AC
  Filled 2014-04-10: qty 1

## 2014-04-10 MED ORDER — METOCLOPRAMIDE HCL 5 MG/ML IJ SOLN: 5.0000 mg | Freq: Three times a day (TID) | INTRAMUSCULAR | Status: DC | PRN

## 2014-04-10 MED ORDER — HYDROMORPHONE HCL 1 MG/ML IJ SOLN: 0.2500 mg | INTRAMUSCULAR | Status: DC | PRN

## 2014-04-10 MED ORDER — METOCLOPRAMIDE HCL 5 MG PO TABS: 5.0000 mg | ORAL_TABLET | Freq: Three times a day (TID) | ORAL | Status: DC | PRN

## 2014-04-10 MED ORDER — FAMOTIDINE 20 MG PO TABS
10.0000 mg | ORAL_TABLET | Freq: Every day | ORAL | Status: DC
  Administered 2014-04-10 – 2014-04-13 (×4): 10 mg via ORAL
  Filled 2014-04-10 (×4): qty 1

## 2014-04-10 MED ORDER — PHENYLEPHRINE HCL 10 MG/ML IJ SOLN
INTRAMUSCULAR | Status: DC | PRN
  Administered 2014-04-10 (×4): 80 ug via INTRAVENOUS

## 2014-04-10 MED ORDER — INSULIN ASPART 100 UNIT/ML ~~LOC~~ SOLN
0.0000 [IU] | Freq: Three times a day (TID) | SUBCUTANEOUS | Status: DC
  Administered 2014-04-10: 2 [IU] via SUBCUTANEOUS
  Administered 2014-04-11: 1 [IU] via SUBCUTANEOUS

## 2014-04-10 MED ORDER — ONDANSETRON HCL 4 MG PO TABS: 4.0000 mg | ORAL_TABLET | Freq: Four times a day (QID) | ORAL | Status: DC | PRN

## 2014-04-10 MED ORDER — AMLODIPINE BESYLATE 5 MG PO TABS: 5.0000 mg | ORAL_TABLET | Freq: Every day | ORAL | Status: DC

## 2014-04-10 MED ORDER — LIDOCAINE HCL (CARDIAC) 20 MG/ML IV SOLN
INTRAVENOUS | Status: AC
  Filled 2014-04-10: qty 5

## 2014-04-10 MED ORDER — BUPIVACAINE LIPOSOME 1.3 % IJ SUSP
20.0000 mL | INTRAMUSCULAR | Status: AC
  Administered 2014-04-10: 20 mL
  Filled 2014-04-10: qty 20

## 2014-04-10 MED ORDER — MIDAZOLAM HCL 2 MG/2ML IJ SOLN
INTRAMUSCULAR | Status: AC
  Filled 2014-04-10: qty 2

## 2014-04-10 MED ORDER — ONDANSETRON HCL 4 MG/2ML IJ SOLN: 4.0000 mg | Freq: Four times a day (QID) | INTRAMUSCULAR | Status: DC | PRN

## 2014-04-10 SURGICAL SUPPLY — 57 items
BLADE SAG 18X100X1.27 (BLADE) ×3 IMPLANT
BLADE SAGITTAL 25.0X1.27X90 (BLADE) ×2 IMPLANT
BLADE SAGITTAL 25.0X1.27X90MM (BLADE) ×1
BLADE SURG 21 STRL SS (BLADE) ×6 IMPLANT
BLOCK CUTTING TIBIAL 4 MED (MISCELLANEOUS) ×6 IMPLANT
BNDG COHESIVE 6X5 TAN STRL LF (GAUZE/BANDAGES/DRESSINGS) ×3 IMPLANT
BNDG GAUZE ELAST 4 BULKY (GAUZE/BANDAGES/DRESSINGS) ×3 IMPLANT
BONE CEMENT PALACOSE (Orthopedic Implant) ×6 IMPLANT
BOWL SMART MIX CTS (DISPOSABLE) ×3 IMPLANT
CAPT KNEE TOTAL 3 ×3 IMPLANT
CEMENT BONE PALACOSE (Orthopedic Implant) ×2 IMPLANT
COVER SURGICAL LIGHT HANDLE (MISCELLANEOUS) ×3 IMPLANT
CUFF TOURNIQUET SINGLE 34IN LL (TOURNIQUET CUFF) ×3 IMPLANT
CUFF TOURNIQUET SINGLE 44IN (TOURNIQUET CUFF) IMPLANT
DRAPE EXTREMITY T 121X128X90 (DRAPE) ×3 IMPLANT
DRAPE IMP U-DRAPE 54X76 (DRAPES) ×3 IMPLANT
DRAPE PROXIMA HALF (DRAPES) ×3 IMPLANT
DRAPE U-SHAPE 47X51 STRL (DRAPES) ×3 IMPLANT
DRSG ADAPTIC 3X8 NADH LF (GAUZE/BANDAGES/DRESSINGS) ×3 IMPLANT
DRSG PAD ABDOMINAL 8X10 ST (GAUZE/BANDAGES/DRESSINGS) ×3 IMPLANT
DURAPREP 26ML APPLICATOR (WOUND CARE) ×3 IMPLANT
ELECT REM PT RETURN 9FT ADLT (ELECTROSURGICAL) ×3
ELECTRODE REM PT RTRN 9FT ADLT (ELECTROSURGICAL) ×1 IMPLANT
FACESHIELD WRAPAROUND (MASK) ×3 IMPLANT
GAUZE SPONGE 4X4 12PLY STRL (GAUZE/BANDAGES/DRESSINGS) ×3 IMPLANT
GLOVE BIOGEL PI IND STRL 9 (GLOVE) ×1 IMPLANT
GLOVE BIOGEL PI INDICATOR 9 (GLOVE) ×2
GLOVE SURG ORTHO 9.0 STRL STRW (GLOVE) ×3 IMPLANT
GOWN STRL REUS W/ TWL XL LVL3 (GOWN DISPOSABLE) ×2 IMPLANT
GOWN STRL REUS W/TWL XL LVL3 (GOWN DISPOSABLE) ×4
HANDPIECE INTERPULSE COAX TIP (DISPOSABLE) ×2
KIT BASIN OR (CUSTOM PROCEDURE TRAY) ×3 IMPLANT
KIT ROOM TURNOVER OR (KITS) ×3 IMPLANT
MANIFOLD NEPTUNE II (INSTRUMENTS) ×3 IMPLANT
NEEDLE SPNL 18GX3.5 QUINCKE PK (NEEDLE) ×3 IMPLANT
NS IRRIG 1000ML POUR BTL (IV SOLUTION) ×3 IMPLANT
PACK TOTAL JOINT (CUSTOM PROCEDURE TRAY) ×3 IMPLANT
PACK UNIVERSAL I (CUSTOM PROCEDURE TRAY) ×3 IMPLANT
PAD ARMBOARD 7.5X6 YLW CONV (MISCELLANEOUS) ×6 IMPLANT
PADDING CAST COTTON 6X4 STRL (CAST SUPPLIES) IMPLANT
SET HNDPC FAN SPRY TIP SCT (DISPOSABLE) ×1 IMPLANT
STAPLER VISISTAT 35W (STAPLE) ×3 IMPLANT
SUCTION FRAZIER TIP 10 FR DISP (SUCTIONS) ×3 IMPLANT
SUT VIC AB 0 CT1 27 (SUTURE) ×2
SUT VIC AB 0 CT1 27XBRD ANBCTR (SUTURE) ×1 IMPLANT
SUT VIC AB 0 CTB1 27 (SUTURE) IMPLANT
SUT VIC AB 1 CT1 27 (SUTURE) ×2
SUT VIC AB 1 CT1 27XBRD ANBCTR (SUTURE) ×1 IMPLANT
SUT VIC AB 1 CTX 36 (SUTURE)
SUT VIC AB 1 CTX36XBRD ANBCTR (SUTURE) IMPLANT
SYR 50ML LL SCALE MARK (SYRINGE) IMPLANT
SYRINGE 20CC LL (MISCELLANEOUS) ×3 IMPLANT
TOWEL OR 17X24 6PK STRL BLUE (TOWEL DISPOSABLE) ×3 IMPLANT
TOWEL OR 17X26 10 PK STRL BLUE (TOWEL DISPOSABLE) ×3 IMPLANT
TRAY FOLEY CATH 16FRSI W/METER (SET/KITS/TRAYS/PACK) IMPLANT
WATER STERILE IRR 1000ML POUR (IV SOLUTION) IMPLANT
WRAP KNEE MAXI GEL POST OP (GAUZE/BANDAGES/DRESSINGS) ×3 IMPLANT

## 2014-04-10 NOTE — Procedures (Signed)
Patient was seen on dialysis and the procedure was supervised.  BFR 350  Via AVF BP is  143/78.   Patient appears to be tolerating treatment well  Matthew Kline A 04/10/2014

## 2014-04-10 NOTE — Progress Notes (Signed)
Utilization review completed.  

## 2014-04-10 NOTE — Anesthesia Postprocedure Evaluation (Signed)
Anesthesia Post Note  Patient: Matthew Kline  Procedure(s) Performed: Procedure(s) (LRB): TOTAL KNEE ARTHROPLASTY (Left)  Anesthesia type: Spinal + femoral nerve block  Patient location: PACU  Post pain: Pain level controlled  Post assessment: Post-op Vital signs reviewed  Last Vitals: BP 131/73 mmHg  Pulse 89  Temp(Src) 36.2 C (Oral)  Resp 18  Wt 222 lb 7.1 oz (100.9 kg)  SpO2 100%  Post vital signs: Reviewed  Level of consciousness: sedated  Complications: No apparent anesthesia complications

## 2014-04-10 NOTE — Transfer of Care (Signed)
Immediate Anesthesia Transfer of Care Note  Patient: Matthew Kline  Procedure(s) Performed: Procedure(s): TOTAL KNEE ARTHROPLASTY (Left)  Patient Location: PACU  Anesthesia Type:Regional and Spinal  Level of Consciousness: awake, alert , oriented and patient cooperative  Airway & Oxygen Therapy: Patient Spontanous Breathing and Patient connected to nasal cannula oxygen  Post-op Assessment: Report given to RN and Post -op Vital signs reviewed and stable  Post vital signs: Reviewed and stable  Last Vitals:  Filed Vitals:   04/10/14 0653  BP: 183/90  Pulse: 91  Temp: 36.4 C  Resp: 18    Complications: No apparent anesthesia complications

## 2014-04-10 NOTE — Progress Notes (Signed)
PT Cancellation Note  Patient Details Name: Matthew Kline MRN: [id number] DOB: Nov 11, 1960   Cancelled Treatment:    Reason Eval/Treat Not Completed: Patient at procedure or test/unavailable. Pt off floor at HD, will re-attempt to see at next available time.   Gustavus Bryant Karlstad 04/10/2014, 3:03 PM

## 2014-04-10 NOTE — Progress Notes (Signed)
Orthopedic Tech Progress Note Patient Details:  Matthew Kline [birth year] [id number]  Ortho Devices Type of Ortho Device:  (footsie roll) Ortho Device/Splint Location: lle Ortho Device/Splint Interventions: Loanne Drilling, Mykayla Brinton 04/10/2014, 12:35 PM

## 2014-04-10 NOTE — Anesthesia Procedure Notes (Addendum)
Spinal Patient location during procedure: OR Staffing Anesthesiologist: ,  Performed by: anesthesiologist  Preanesthetic Checklist Completed: patient identified, site marked, surgical consent, pre-op evaluation, timeout performed, IV checked, risks and benefits discussed and monitors and equipment checked Spinal Block Patient position: sitting Prep: Betadine Patient monitoring: heart rate, continuous pulse ox and blood pressure Approach: midline Location: L3-4 Injection technique: single-shot Needle Needle type: Sprotte  Needle gauge: 24 G Needle length: 9 cm Assessment Events: paresthesia and Right leg, transient. Repositioned more midline. No further issues Additional Notes Expiration date of kit checked and confirmed. Patient tolerated procedure well, without complications.    Anesthesia Regional Block:  Femoral nerve block  Pre-Anesthetic Checklist: ,, timeout performed, Correct Patient, Correct Site, Correct Laterality, Correct Procedure, Correct Position, site marked, Risks and benefits discussed, Surgical consent,  Pre-op evaluation,  Post-op pain management  Laterality: Left  Prep: chloraprep       Needles:  Injection technique: Single-shot  Needle Type: Stimulator Needle - 80     Needle Length: 9cm 9 cm Needle Gauge: 22 and 22 G  Needle insertion depth: 6 cm   Additional Needles:  Procedures: ultrasound guided (picture in chart) and nerve stimulator Femoral nerve block  Nerve Stimulator or Paresthesia:  Response: Patellar snap, 0.5 mA,   Additional Responses:   Narrative:  Injection made incrementally with aspirations every 5 mL.  Performed by: Personally  Anesthesiologist: ,   Additional Notes: BP cuff, EKG monitors applied. Sedation begun. Femoral artery palpated for location of nerve. After nerve location verified with U/S anesthetic injected incrementally, slowly , and after neg aspiration under direct U/S guidance.  Tolerated well.    

## 2014-04-10 NOTE — Consult Note (Signed)
[facility] Renal Consultation Note    Indication for Consultation:  Management of ESRD/hemodialysis; anemia, hypertension/volume and secondary hyperparathyroidism PCP: Dr. Bubba Camp  HPI: Matthew Kline is a 54 y.o. male with ESRD thought secondary to DM, w/ hx PVD, CVA, multiple toe amputations, PMR who dialzyes MWF in the nocturnal program at [facility].  He is admitted following left TKR by Dr. Sharol Given and is doing well.  He anticipates being here through Friday. He has no problems with his dialysis treatments and sleeps well during them. He is aware he is slowly losing weight, semi-intentionally.  He does most of the cooking at home and is a former Biomedical scientist. He has had no recent N, V, D, fever, chills CP or SOB. He still makes urine. He is due for dialysis today.  Seen on HD- looks very comfortable "I can deal with a lot of pain"   Past Medical History  Diagnosis Date  . Polymyalgia rheumatica   . Hypertension   . Hyperlipidemia   . Diabetic neuropathy   . Osteomyelitis of foot, left, acute   . Anxiety   . Insomnia, unspecified   . Unspecified vitamin D deficiency   . Anemia, unspecified   . Other chronic postoperative pain   . Unspecified hereditary and idiopathic peripheral neuropathy   . Allergy   . Unspecified osteomyelitis, site unspecified   . Long term (current) use of anticoagulants   . 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 Ulcer     diabetic foot   . GERD (gastroesophageal reflux disease) 01/27/2009  . Diabetes mellitus 01/27/2009  . Arthralgia 2010    polyarticular  . Hemorrhoids, internal 10/2011    small  . Myocardial infarction     mild 1995  . Stroke 01/10/06    no residual at this ti,e  . CHF (congestive heart failure)   . CHF (congestive heart failure) 07/25/2009    denies  . Pneumonia     hx  . Chronic kidney disease     Followed by Dr Justin Mend tues,thurs,sat  . Hemodialysis access site with mature fistula   . Sleep apnea     hx of, had  surgery   Past Surgical History  Procedure Laterality Date  . Amputation  01/21/2012    Procedure: AMPUTATION RAY;  Surgeon: [name], MD;  Location: [place];  Service: Orthopedics;  Laterality: Left;  Left Foot 4th Ray Amputation  . Cervical discectomy  02/2011    Fusion  . Arthroscopic knee  surgery Left 08-25-2012  . Amputation right foot digits 1-4    . Eye surgery Bilateral     [facility]  . Bascilic vein transposition Left 10/19/2012    Procedure: BASCILIC VEIN TRANSPOSITION;  Surgeon: Serafina Mitchell, MD;  Location: [place];  Service: Vascular;  Laterality: Left;  . Tonsillectomy    . Amputation Left 05/04/2013    Procedure: AMPUTATION DIGIT;  Surgeon: [name], MD;  Location: [place];  Service: Orthopedics;  Laterality: Left;  Left Great Toe Amputation at [facility]  . Toe surgery Left April 2015    Big toe removed on left foot.   Family History  Problem Relation Age of Onset  . Hypertension Mother   . Cancer Mother 34    Ovarian  . Heart disease Maternal Aunt   . Stroke Maternal Grandfather    Social History:  reports that he has been smoking Cigarettes.  He has a 3.75 pack-year smoking history. He has never used  smokeless tobacco. He reports that he does not drink alcohol or use illicit drugs. Allergies  Allergen Reactions  . Morphine And Related     hallucinations  . Tygacil [Tigecycline] Nausea And Vomiting, Nausea Only and Other (See Comments)   Prior to Admission medications   Medication Sig Start Date End Date Taking? Authorizing Provider  amitriptyline (ELAVIL) 50 MG tablet Take 1 tablet (50 mg total) by mouth at bedtime. 03/28/13  Yes Mahima Pandey, MD  amLODipine (NORVASC) 5 MG tablet Take 1 tablet by mouth daily. 02/22/14  Yes Historical Provider, MD  calcium acetate (PHOSLO) 667 MG capsule Take 667 mg by mouth 3 (three) times daily with meals.   Yes Historical Provider, MD  Cholecalciferol (VITAMIN D PO) Take 1 tablet by mouth 3 (three) times a week. At dialysis   Yes  Historical Provider, MD  gabapentin (NEURONTIN) 100 MG capsule Take 2 capsules (200 mg total) by mouth 2 (two) times daily. Patient taking differently: Take 200 mg by mouth daily.  09/28/13  Yes Mahima Pandey, MD  glipiZIDE (GLUCOTROL) 5 MG tablet Take 1 tablet (5 mg total) by mouth daily before breakfast. 10/24/13  Yes Mahima Pandey, MD  ranitidine (ZANTAC) 150 MG tablet Take 150 mg by mouth daily as needed.    Yes Historical Provider, MD  sildenafil (VIAGRA) 25 MG tablet Take 2 tablets (50 mg total) by mouth daily as needed for erectile dysfunction. 06/27/13  Yes Mahima Bubba Camp, MD  zolpidem (AMBIEN) 10 MG tablet Take 1 tablet (10 mg total) by mouth at bedtime as needed for sleep. 12/28/13  Yes [place], DO   Current Facility-Administered Medications  Medication Dose Route Frequency Provider Last Rate Last Dose  . 0.9 %  sodium chloride infusion   Intravenous Continuous [name], MD 10 mL/hr at 04/10/14 1118    . chlorhexidine (HIBICLENS) 4 % liquid 4 application  60 mL Topical Once Meridee Score V, MD      . HYDROmorphone (DILAUDID) injection 0.25-0.5 mg  0.25-0.5 mg Intravenous Q5 min PRN Nolon Nations, MD      . ketorolac (TORADOL) 15 MG/ML injection 15 mg  15 mg Intravenous 4 times per day [name], MD      . ketorolac (TORADOL) 15 MG/ML injection           . meperidine (DEMEROL) injection 6.25-12.5 mg  6.25-12.5 mg Intravenous Q5 min PRN Nolon Nations, MD      . oxyCODONE (Oxy IR/ROXICODONE) immediate release tablet 5-10 mg  5-10 mg Oral Q3H PRN [name], MD      . promethazine (PHENERGAN) injection 6.25-12.5 mg  6.25-12.5 mg Intravenous Q15 min PRN Nolon Nations, MD       Labs: Basic Metabolic Panel:  Recent Labs Lab 04/10/14 0655  NA 140  K 4.3  GLUCOSE 114*   CBC:  Recent Labs Lab 04/10/14 0655  HGB 12.2*  HCT 36.0*   CBG:  Recent Labs Lab 04/10/14 1041  GLUCAP 94   IStudies/Results: No results found.  ROS: As per HPI otherwise  negative.  Physical Exam: Filed Vitals:   04/10/14 1100 04/10/14 1115 04/10/14 1130 04/10/14 1145  BP: 111/70 123/77 131/78 137/78  Pulse: 79 77 79 81  Temp:      TempSrc:      Resp: 19 12 15 14   SpO2: 94% 95% 92% 91%     General: Well developed, well nourished, in no acute distress doing well post op Head: Normocephalic, atraumatic, sclera non-icteric,  mucus membranes are moist Neck: Supple. JVD not elevated. Lungs: Clear bilaterally to auscultation without wheezes, rales, or rhonchi. Breathing is unlabored. Heart: RRR with S1 S2. No murmurs, rubs, or gallops appreciated. Abdomen: Soft, non-tender, +BS Lower extremities: without edema or ischemic changes, no open wounds, left knee immobilizer/ice packs in place; s/p several amputated toes on each foot well healed Neuro: Alert and oriented X 3. Moves all extremities spontaneously. Psych:  Responds to questions appropriately with a normal affect. Dialysis Access: left upper AVF + bruit  Dialysis Orders: Center:[facility] - Nocturnal MWF 8 hours 300/500 2K 2 Ca left upper AVF heparin 4000 with 5 K mid tmt no Mircera, venofer 50/week calcitriol 2 Recent labs: Hgb 11.3 3/23 stable  iPTH 431 2/10  Assessment/Plan: 1. s/p LTKR Dr. Sharol Given 3/30 due to osteoarthritis 2. ESRD -  MWF - nocturnal - will run at previous time of 4 hrs at higher BFR/DFR- hold heparin due to surgery - HD today. 3. Hypertension/volume  - On Norvasc 5 Has been leaving below edw between 98.4 and 99.7 the past four treamtents with minimal UF 0 - 1.3 - needs EDW lowered  BP pre HD 160/90s +/- to 130 standing and 130 - 150 sitting post HD- will be hard to get an accurate edw due to knee brace 4. Anemia  - follow CBC post op - resume ESA if indicated - last outpt Hgb 11.3 3/23 - continue weekly Fe 5. Metabolic bone disease -  Continue calcitriol and calcium acetate 1 ac/(not taking sensipar yet though it is on his outpt med list) 6. Nutrition - CL to advance to renal/carb mod +  vitamin 7. DM - continue glucotrol - DM well controlled   Myriam Jacobson, PA-C [place] [phone] 04/10/2014, 11:58 AM   Patient seen and examined, agree with above note with above modifications. 54 year old WM ESRD on nocturnal- now is s/p knee replacement.  He looks good, doing traditional HD while in house- I agree that maybe seems like EDW could use a decrease  Corliss Parish, MD 04/10/2014

## 2014-04-10 NOTE — Op Note (Signed)
04/10/2014  10:21 AM  PATIENT:  Matthew Kline    PRE-OPERATIVE DIAGNOSIS:  Osteoarthritis Left Knee  POST-OPERATIVE DIAGNOSIS:  Same  PROCEDURE:  TOTAL KNEE ARTHROPLASTY  SURGEON:  [name], MD  PHYSICIAN ASSISTANT:None ANESTHESIA:   General  PREOPERATIVE INDICATIONS:  Matthew Kline is a  54 y.o. male with a diagnosis of Osteoarthritis Left Knee who failed conservative measures and elected for surgical management.    The risks benefits and alternatives were discussed with the patient preoperatively including but not limited to the risks of infection, bleeding, nerve injury, cardiopulmonary complications, the need for revision surgery, among others, and the patient was willing to proceed.  OPERATIVE IMPLANTS: Medacta total knee. Size 5 femur. Size 4 tibia. 10 mm polyethylene tray. Size 3 patella.  OPERATIVE FINDINGS: Tricompartmental arthritic changes  OPERATIVE PROCEDURE: Patient was brought to the operating room and underwent a spinal anesthetic. After adequate levels of anesthesia were obtained patient's left lower extremity was prepped using DuraPrep draped into a sterile field a timeout was called. A midline incision was made carried down to a medial parapatellar retinacular incision. The patella was resurfaced and 10 mm was taken off the patella and lug cuts were made for the size 3 patella. Attention was first focused on the femur. The custom cutting blocks were used and 10 mm was taken off the distal femur. Attention was then focused on the tibia and the custom cutting blocks were used and 10 mm was taken off the tibia alignment was checked with axis through the middle of the ankle. Attention was then focused back on the femur and the box cuts and chamfer cuts were made for the size 5 femur. The tibia was then sized and keel punches were made for size 4 tibia. Trial components were placed in the was stable with full extension and neutral varus valgus alignment. The  wound was irrigated with normal saline and the tibial and femoral components were cemented in place the tibial tray was placed and secured with a screw the patella button was cemented and clamped and the knee was left in full extension until the cement hardened. The knee was placed through a full range of motion and the patella tracked midline there was no subluxation. The retinaculum was closed using #1 Vicryl. Subcutaneous is closed using 0 Vicryl. Skin was closed using staples. A sterile compressive dressing was applied. Patient was taken to the PACU in stable condition.

## 2014-04-10 NOTE — H&P (Signed)
TOTAL KNEE ADMISSION H&P  Patient is being admitted for left total knee arthroplasty.  Subjective:  Chief Complaint:left knee pain.  HPI: Matthew Kline, 54 y.o. male, has a history of pain and functional disability in the left knee due to arthritis and has failed non-surgical conservative treatments for greater than 12 weeks to includeNSAID's and/or analgesics, corticosteriod injections, use of assistive devices, weight reduction as appropriate and activity modification.  Onset of symptoms was gradual, starting 8 years ago with gradually worsening course since that time. The patient noted no past surgery on the left knee(s).  Patient currently rates pain in the left knee(s) at 8 out of 10 with activity. Patient has night pain, worsening of pain with activity and weight bearing, pain that interferes with activities of daily living, pain with passive range of motion, crepitus and joint swelling.  Patient has evidence of subchondral cysts, subchondral sclerosis, periarticular osteophytes, joint subluxation and joint space narrowing by imaging studies. This patient has had avascular necrosis of the knee. There is no active infection.  Patient Active Problem List   Diagnosis Date Noted  . Diabetes mellitus with renal manifestations, controlled 10/24/2013  . Hypertensive renal disease 06/27/2013  . DM type 2 causing vascular disease 06/27/2013  . Erectile dysfunction 06/27/2013  . Depression 06/27/2013  . Claudication of left lower extremity 12/19/2012  . Essential hypertension, benign 12/19/2012  . Sinusitis, acute maxillary 11/22/2012  . Otitis, externa, infective 11/14/2012  . Leg edema, left 11/14/2012  . End stage renal disease 10/02/2012  . Controlled type 2 DM with proteinuria or microalbuminuria 09/19/2012  . GERD (gastroesophageal reflux disease) 09/19/2012  . Leukocytosis, unspecified 09/19/2012  . Lacunar infarction 08/17/2012  . Polymyalgia rheumatica 08/17/2012  . Bile reflux  gastritis 08/17/2012  . Unspecified essential hypertension 05/10/2012  . Unspecified vitamin D deficiency 05/10/2012  . Type II or unspecified type diabetes mellitus with neurological manifestations, not stated as uncontrolled(250.60) 05/10/2012  . Hyperlipidemia LDL goal <100 05/10/2012  . Anemia of chronic disease 05/10/2012  . Screening for prostate cancer 05/10/2012  . Chronic kidney disease (CKD), stage IV (severe) 05/10/2012  . Peripheral autonomic neuropathy due to DM 05/10/2012  . Callus of foot 05/10/2012  . Urgency of urination 05/10/2012  . Hyperkalemia 05/10/2012  . Candidiasis of the esophagus 10/12/2011  . Internal hemorrhoids without mention of complication [id number]  . Pre-syncope 07/25/2009  . Anasarca 02/21/2009  . DJD (degenerative joint disease) of cervical spine 02/17/2009   Past Medical History  Diagnosis Date  . Polymyalgia rheumatica   . Hypertension   . Hyperlipidemia   . Diabetic neuropathy   . Osteomyelitis of foot, left, acute   . Anxiety   . Insomnia, unspecified   . Unspecified vitamin D deficiency   . Anemia, unspecified   . Other chronic postoperative pain   . Unspecified hereditary and idiopathic peripheral neuropathy   . Allergy   . Unspecified osteomyelitis, site unspecified   . Long term (current) use of anticoagulants   . 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 Ulcer     diabetic foot   . GERD (gastroesophageal reflux disease) 01/27/2009  . Diabetes mellitus 01/27/2009  . Arthralgia 2010    polyarticular  . Hemorrhoids, internal 10/2011    small  . Myocardial infarction     mild 1995  . Stroke 01/10/06    no residual at this ti,e  . CHF (congestive heart failure)   . CHF (congestive heart failure) 07/25/2009    denies  .  Pneumonia     hx  . Chronic kidney disease     Followed by Dr Justin Mend tues,thurs,sat  . Hemodialysis access site with mature fistula   . Sleep apnea     hx of, had surgery    Past Surgical History   Procedure Laterality Date  . Amputation  01/21/2012    Procedure: AMPUTATION RAY;  Surgeon: [name], MD;  Location: [place];  Service: Orthopedics;  Laterality: Left;  Left Foot 4th Ray Amputation  . Cervical discectomy  02/2011    Fusion  . Arthroscopic knee  surgery Left 08-25-2012  . Amputation right foot digits 1-4    . Eye surgery Bilateral     [facility]  . Bascilic vein transposition Left 10/19/2012    Procedure: BASCILIC VEIN TRANSPOSITION;  Surgeon: Serafina Mitchell, MD;  Location: [place];  Service: Vascular;  Laterality: Left;  . Tonsillectomy    . Amputation Left 05/04/2013    Procedure: AMPUTATION DIGIT;  Surgeon: [name], MD;  Location: [place];  Service: Orthopedics;  Laterality: Left;  Left Great Toe Amputation at [facility]  . Toe surgery Left April 2015    Big toe removed on left foot.    No prescriptions prior to admission   Allergies  Allergen Reactions  . Morphine And Related     hallucinations  . Tygacil [Tigecycline] Nausea And Vomiting, Nausea Only and Other (See Comments)    History  Substance Use Topics  . Smoking status: Current Some Day Smoker -- 0.25 packs/day for 15 years    Types: Cigarettes  . Smokeless tobacco: Never Used     Comment: occ cig  . Alcohol Use: No    Family History  Problem Relation Age of Onset  . Hypertension Mother   . Cancer Mother 27    Ovarian  . Heart disease Maternal Aunt   . Stroke Maternal Grandfather      Review of Systems  All other systems reviewed and are negative.   Objective:  Physical Exam  Vital signs in last 24 hours:    Labs:   Estimated body mass index is 31.87 kg/(m^2) as calculated from the following:   Height as of 10/24/13: 5\' 11"  (1.803 m).   Weight as of 10/24/13: 103.602 kg (228 lb 6.4 oz).   Imaging Review Plain radiographs demonstrate moderate degenerative joint disease of the left knee(s). The overall alignment ismild varus. The bone quality appears to be adequate for age and reported  activity level.  Assessment/Plan:  End stage arthritis, left knee   The patient history, physical examination, clinical judgment of the provider and imaging studies are consistent with end stage degenerative joint disease of the left knee(s) and total knee arthroplasty is deemed medically necessary. The treatment options including medical management, injection therapy arthroscopy and arthroplasty were discussed at length. The risks and benefits of total knee arthroplasty were presented and reviewed. The risks due to aseptic loosening, infection, stiffness, patella tracking problems, thromboembolic complications and other imponderables were discussed. The patient acknowledged the explanation, agreed to proceed with the plan and consent was signed. Patient is being admitted for inpatient treatment for surgery, pain control, PT, OT, prophylactic antibiotics, VTE prophylaxis, progressive ambulation and ADL's and discharge planning. The patient is planning to be discharged home with home health services

## 2014-04-10 NOTE — Progress Notes (Signed)
Pt arrived to pacu from OR on a bed.  Pt received spinal anesthesia and a nerve block.  Pt numb and unable to move lower extremities at this time.  Denies SOB, pain, and nausea.  Pt states he receives HD on MWF at a slow rate at night because he gets sick.  He goes to [facility]ialysis center.

## 2014-04-11 LAB — GLUCOSE, CAPILLARY
GLUCOSE-CAPILLARY: 109 mg/dL — AB (ref 70–99)
GLUCOSE-CAPILLARY: 88 mg/dL (ref 70–99)
GLUCOSE-CAPILLARY: 123 mg/dL — AB (ref 70–99)
Glucose-Capillary: 128 mg/dL — ABNORMAL HIGH (ref 70–99)

## 2014-04-11 MED ORDER — DARBEPOETIN ALFA 60 MCG/0.3ML IJ SOSY
60.0000 ug | PREFILLED_SYRINGE | INTRAMUSCULAR | Status: DC
Start: 2014-04-12 — End: 2014-04-13
  Administered 2014-04-12: 60 ug via INTRAVENOUS
  Filled 2014-04-11: qty 0.3

## 2014-04-11 NOTE — Accreditation Note (Signed)
CARE MANAGEMENT NOTE 04/11/2014  Patient:  Matthew Kline, Matthew Kline   Account Number:  [id number]  Date Initiated:  04/11/2014  Documentation initiated by:  Ricki Miller  Subjective/Objective Assessment:   54 yr old male admitted with osteoarthritis of left knee. Patient underwent a left total knee arthroplasty.     Action/Plan:   Case manager spoke to patient and wife concerning Home health and DME needs. Referral called to [place], [facility] Liaison. patient has family support at discharge.   Anticipated DC Date:  04/12/2014   Anticipated DC Plan:  [facility]  CM consult      [facility] Choice  HOME HEALTH  DURABLE MEDICAL EQUIPMENT   Choice offered to / List presented to:  C-3 Spouse   DME arranged  [place]  3-N-1      DME agency  [employer]     HH arranged  HH-2 PT      [place].   Status of service:  Completed, signed off Medicare Important Message given?   (If response is "NO", the following Medicare IM given date fields will be blank) Date Medicare IM given:   Medicare IM given by:   Date Additional Medicare IM given:   Additional Medicare IM given by:    Discharge Disposition:  [place]  Per UR Regulation:  Reviewed for med. necessity/level of care/duration of stay

## 2014-04-11 NOTE — Evaluation (Signed)
Physical Therapy Evaluation Patient Details Name: Matthew Kline MRN: [id number] DOB: 07/26/1960 Today's Date: 04/11/2014   History of Present Illness  Matthew Kline, 54 y.o. male, has a history of pain and functional disability in the left knee due to arthritis and has failed non-surgical conservative treatments for greater than 12 weeks.  Pt s/p L TKA.  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   Clinical Impression  Evaluation findings limited today due to AMS and falling asleep multiple times during evaluation, possible side effects of post surgical medications. Will reassess functional mobility and ambulation later today.  If cognition and functional mobility permit, pt may still be a good candidate for return home with 24 hour supervision and home health PT, as pt stated he has family support at home.    Follow Up Recommendations Home health PT;Supervision/Assistance - 24 hour    Equipment Recommendations   (Will assess later today)    Recommendations for Other Services       Precautions / Restrictions Precautions Precautions: Knee Precaution Booklet Issued: Yes (comment) Precaution Comments: Will reinforce precautions later today, due to lethargy and lack of focus/ability to comprehend  Restrictions Weight Bearing Restrictions: Yes LLE Weight Bearing: Weight bearing as tolerated      Mobility  Bed Mobility               General bed mobility comments: Pt received sitting in chair  Transfers Overall transfer level: Needs assistance Equipment used: Rolling walker (2 wheeled) Transfers: Sit to/from Stand Sit to Stand: Min assist         General transfer comment: Difficult due to mental status and pain  Ambulation/Gait Ambulation/Gait assistance: Mod assist Ambulation Distance (Feet): 4 Feet Assistive device: Rolling walker (2 wheeled) Gait Pattern/deviations: Step-to  pattern;Decreased stride length;Antalgic Gait velocity: Extremely slow and antalgic, only able to ambulate 4 feet before needing to sit back in chair Gait velocity interpretation: Below normal speed for age/gender    Stairs            Wheelchair Mobility    Modified Rankin (Stroke Patients Only)       Balance Overall balance assessment: Needs assistance         Standing balance support: Bilateral upper extremity supported;During functional activity Standing balance-Leahy Scale: Poor Standing balance comment: Extremely heavy reliance on B UE with short ambulation                             Pertinent Vitals/Pain Pain Assessment:  (Not able to assess due to AMS)    Home Living Family/patient expects to be discharged to:: Private residence Living Arrangements: Spouse/significant other;Children Available Help at Discharge: Family Type of Home:  (Not identified due to AMS) Home Access:  (Not identified due to AMS)     Home Layout:  (Not identified due to AMS) Home Equipment:  (Not identified due to AMS) Additional Comments: Unable to recieve accurate history due to extreme lethargy; pt fell asleep multiple times mid treatment    Prior Function Level of Independence:  (Not able to due to AMS)         Comments: Not able to assess due to AMS     Hand Dominance        Extremity/Trunk Assessment   Upper Extremity Assessment: Overall WFL for tasks assessed           Lower Extremity Assessment: Difficult  to assess due to impaired cognition      Cervical / Trunk Assessment: Normal  Communication   Communication: Receptive difficulties;Expressive difficulties  Cognition Arousal/Alertness: Lethargic;Suspect due to medications Behavior During Therapy:  (Fell asleep multiple times mid evaluation) Overall Cognitive Status: Difficult to assess                      General Comments General comments (skin integrity, edema, etc.): Pt fell  asleep multiple times mid conversation, and BP was low (101/59-102/61) post ambulation    Exercises        Assessment/Plan    PT Assessment Patient needs continued PT services  PT Diagnosis Difficulty walking;Abnormality of gait;Generalized weakness;Acute pain;Altered mental status   PT Problem List Decreased strength;Decreased range of motion;Decreased activity tolerance;Decreased balance;Decreased mobility;Decreased coordination;Decreased knowledge of use of DME;Decreased safety awareness;Decreased knowledge of precautions  PT Treatment Interventions DME instruction;Gait training;Stair training;Functional mobility training;Therapeutic activities;Therapeutic exercise;Balance training;Patient/family education   PT Goals (Current goals can be found in the Care Plan section) Acute Rehab PT Goals Patient Stated Goal: Return home PT Goal Formulation: With patient Time For Goal Achievement: 04/25/14 Potential to Achieve Goals: Fair    Frequency 7X/week   Barriers to discharge Other (comment) Cognitive state and response to medications    Co-evaluation               End of Session Equipment Utilized During Treatment: Gait belt Activity Tolerance: Patient limited by lethargy Patient left: in chair;with call bell/phone within reach Nurse Communication: Mobility status;Other (comment) (Low blood pressure, cognition)         Time: [id number] PT Time Calculation (min) (ACUTE ONLY): 23 min   Charges:         PT G Codes:        Katiya Fike 05-08-2014, 12:45 PM  Lucas Mallow, SPT (student physical therapist) Office phone: [phone]

## 2014-04-11 NOTE — Progress Notes (Signed)
Physical Therapy Treatment Note  Clinical Impression: Pt demonstrated improved cognition and was able to perform functional activity and ambulation at min assist level with assist x2.  Depending on progress with PT and stair navigation, current plan to d/c home with 24 hour supervision remains appropriate.     04/11/14 1451  PT Visit Information  Last PT Received On 04/11/14  Assistance Needed +2  History of Present Illness Matthew Kline, 54 y.o. male, has a history of pain and functional disability in the left knee due to arthritis and has failed non-surgical conservative treatments for greater than 12 weeks.  Pt s/p L TKA.  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   PT Time Calculation  PT Start Time (ACUTE ONLY) 1451  PT Stop Time (ACUTE ONLY) 1515  PT Time Calculation (min) (ACUTE ONLY) 24 min  Subjective Data  Patient Stated Goal Return home  Precautions  Precautions Knee  Precaution Booklet Issued Yes (comment)  Precaution Comments Reinforced no resting knee flexion  Required Braces or Orthoses Knee Immobilizer - Left (Spoke with MD to place orders )  Knee Immobilizer - Left On when out of bed or walking (Due to lack of knee stability/inability to perform quad set)  Restrictions  Weight Bearing Restrictions Yes  LLE Weight Bearing WBAT  Pain Assessment  Pain Assessment 0-10  Pain Score 6  Pain Location L knee  Pain Descriptors / Indicators Sore  Pain Intervention(s) Monitored during session  Cognition  Arousal/Alertness Awake/alert  Behavior During Therapy WFL for tasks assessed/performed  Overall Cognitive Status Within Functional Limits for tasks assessed  Bed Mobility  General bed mobility comments Pt received sitting in chair  Transfers  Overall transfer level Needs assistance  Equipment used Rolling walker (2 wheeled)  Transfers Sit to/from Stand  Sit to Stand Min assist  General  transfer comment Able to demonstrate safe hand placement, still needs min assist due to weakness and instability  Ambulation/Gait  Ambulation/Gait assistance Min assist  Ambulation Distance (Feet) 50 Feet  Assistive device Rolling walker (2 wheeled)  Gait Pattern/deviations Step-to pattern  Gait velocity Slow  Gait velocity interpretation Below normal speed for age/gender  General Gait Details Gait deficits prior to admission due to R MT 1-4 amputation; gait antalgic and L knee extremely unstable, recommend following with chair  Balance  Overall balance assessment Needs assistance  Standing balance support Bilateral upper extremity supported;During functional activity  Standing balance-Leahy Scale Poor  Standing balance comment Extreme reliance on B UE with ambulation and transfers  General Comments  General comments (skin integrity, edema, etc.) Pt alert and oriented this afternoon  Exercises  Exercises Total Joint  Total Joint Exercises  Ankle Circles/Pumps AROM;10 reps;Seated;Both  Quad Sets AROM;10 reps;Seated;Left  Heel Slides 5 reps;AROM;Left;Seated  PT - End of Session  Equipment Utilized During Treatment Gait belt  Activity Tolerance Patient tolerated treatment well  Patient left in chair;with call bell/phone within reach  Nurse Communication Mobility status (Ordering of knee immobilizer)  PT - Assessment/Plan  PT Plan Current plan remains appropriate  PT Frequency (ACUTE ONLY) 7X/week  Follow Up Recommendations Home health PT;Supervision/Assistance - 24 hour  PT equipment (Will let us know tomorrow)  PT Goal Progression  Progress towards PT goals Progressing toward goals   Lucas Mallow, SPT (student physical therapist) Office phone: [phone]

## 2014-04-11 NOTE — Progress Notes (Signed)
Occupational Therapy Evaluation Patient Details Name: Matthew Kline MRN: [id number] DOB: December 12, 1960 Today's Date: 04/11/2014    History of Present Illness Matthew Kline, 54 y.o. male, has a history of pain and functional disability in the left knee due to arthritis and has failed non-surgical conservative treatments for greater than 12 weeks.  Pt s/p L TKA.  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    Clinical Impression   PTA, pt independent with ADL and mobility. Began education regarding compensatory techniques for ADL and mobility. Pt will benefit from acute OT to address established goals and facilitate D/C home with intermittent S.     Follow Up Recommendations  No OT follow up;Supervision - Intermittent    Equipment Recommendations  3 in 1 bedside comode    Recommendations for Other Services       Precautions / Restrictions Precautions Precautions: Knee Precaution Booklet Issued: Yes (comment) Precaution Comments: Reinforced no resting knee flexion Required Braces or Orthoses: Knee Immobilizer - Left Knee Immobilizer - Left: On when out of bed or walking Restrictions Weight Bearing Restrictions: Yes LLE Weight Bearing: Weight bearing as tolerated      Mobility Bed Mobility               General bed mobility comments: Pt received sitting in chair  Transfers Overall transfer level: Needs assistance Equipment used: Rolling walker (2 wheeled) Transfers: Sit to/from Stand Sit to Stand: Min assist         General transfer comment: Good carry over from prior PT session    Balance Overall balance assessment: Needs assistance         Standing balance support: During functional activity;Bilateral upper extremity supported Standing balance-Leahy Scale: Poor Standing balance comment: more difficulty with backward stepping                            ADL Overall  ADL's : Needs assistance/impaired     Grooming: Set up   Upper Body Bathing: Set up   Lower Body Bathing: Minimal assistance;Sit to/from stand   Upper Body Dressing : Set up   Lower Body Dressing: Moderate assistance;Sit to/from stand   Toilet Transfer: Moderate assistance;Ambulation Toilet Transfer Details (indicate cue type and reason): L knee bucking when stepping back Toileting- Clothing Manipulation and Hygiene: Minimal assistance     Tub/Shower Transfer Details (indicate cue type and reason): discussed options for tub/shower transfers Functional mobility during ADLs: Minimal assistance;Rolling walker;Cueing for safety;Cueing for sequencing (short distance - @ 5 feet. L knee buckling) General ADL Comments: Began education regarding ADL and compensatory techniques. will address tub/shower transfers in am. Pt feels that he will be able to use the walk in shower                     Pertinent Vitals/Pain Pain Assessment: 0-10 Pain Score: 7  Pain Location: L knee Pain Descriptors / Indicators: Sore Pain Intervention(s): Limited activity within patient's tolerance;Monitored during session;Repositioned;Ice applied     Hand Dominance     Extremity/Trunk Assessment Upper Extremity Assessment Upper Extremity Assessment: Overall WFL for tasks assessed   Lower Extremity Assessment Lower Extremity Assessment: Defer to PT evaluation   Cervical / Trunk Assessment Cervical / Trunk Assessment: Normal   Communication Communication Communication: No difficulties   Cognition Arousal/Alertness: Awake/alert Behavior During Therapy: WFL for tasks assessed/performed Overall Cognitive Status: Within Functional Limits for tasks  assessed                     General Comments       Exercises Exercises: Total Joint     Shoulder Instructions      Home Living Family/patient expects to be discharged to:: Private residence Living Arrangements: Spouse/significant  other;Children Available Help at Discharge: Family Type of Home: House Home Access: Stairs to enter Technical brewer of Steps: 2 Entrance Stairs-Rails: None Home Layout: One level     Bathroom Shower/Tub: Tub/shower unit;Walk-in shower Shower/tub characteristics: Architectural technologist: Handicapped height Bathroom Accessibility: Yes How Accessible: Accessible via walker Home Equipment: Other (comment) (Will let us know tomorrow)   Additional Comments: unsure of DME      Prior Functioning/Environment Level of Independence: Independent             OT Diagnosis: Generalized weakness;Acute pain   OT Problem List: Decreased strength;Decreased range of motion;Decreased activity tolerance;Impaired balance (sitting and/or standing);Decreased safety awareness;Decreased knowledge of use of DME or AE;Pain   OT Treatment/Interventions: Self-care/ADL training;DME and/or AE instruction;Patient/family education;Therapeutic activities    OT Goals(Current goals can be found in the care plan section) Acute Rehab OT Goals Patient Stated Goal: Return home OT Goal Formulation: With patient  OT Frequency: Min 2X/week   Barriers to D/C:            Co-evaluation              End of Session Equipment Utilized During Treatment: Gait belt;Rolling walker Nurse Communication: Mobility status  Activity Tolerance: Patient tolerated treatment well Patient left: in chair;with call bell/phone within reach   Time: [id number] OT Time Calculation (min): 29 min Charges:  OT General Charges $OT Visit: 1 Procedure OT Evaluation $Initial OT Evaluation Tier I: 1 Procedure OT Treatments $Self Care/Home Management : 8-22 mins G-Codes:    Matthew Kline,Matthew Kline 24-Apr-2014, 5:50 PM   [facility], OTR/L  [phone] 04-24-2014

## 2014-04-11 NOTE — Progress Notes (Signed)
Orthopedic Tech Progress Note Patient Details:  Matthew Kline Aug 10, 1960 [id number]  Ortho Devices Type of Ortho Device: Knee Immobilizer Ortho Device/Splint Location: lle Ortho Device/Splint Interventions: Application   Cammer, Theodoro Parma 04/11/2014, 5:23 PM

## 2014-04-11 NOTE — Progress Notes (Signed)
Patient has ESRD went to dialysis after surgery yesterday.  Due to void.  He does not have the urge.  Bladder scan at 0400 only showed 54 mL in bladder.  MD is aware.

## 2014-04-11 NOTE — Progress Notes (Signed)
[name], male   DOB: September 25, 1960, 54 y.o.   MRN: [id number] Postoperative day 1 left total knee arthroplasty. Patient without complaints this morning. Plan for discharge Friday or Saturday.

## 2014-04-11 NOTE — Progress Notes (Signed)
Subjective:  Painful restless night- getting up for first time- HD went well yesterday  Objective Vital signs in last 24 hours: Filed Vitals:   04/10/14 1826 04/11/14 0019 04/11/14 0526 04/11/14 0800  BP: 118/75 111/46 116/68   Pulse: 86 74 84   Temp: 97.7 F (36.5 C) 98.3 F (36.8 C) 97.9 F (36.6 C)   TempSrc: Oral     Resp: 18 18 18 18   Weight:      SpO2: 95% 96% 95%    Weight change:   Intake/Output Summary (Last 24 hours) at 04/11/14 0937 Last data filed at 04/11/14 0600  Gross per 24 hour  Intake   1330 ml  Output   1800 ml  Net   -470 ml    Dialysis Orders: Center:[facility] - Nocturnal MWF 8 hours 300/500 2K 2 Ca left upper AVF heparin 4000 with 5 K mid tmt no Mircera, venofer 50/week calcitriol 2 Recent labs: Hgb 11.3 3/23 stable iPTH 431 2/10   Assessment/Plan: 1. s/p LTKR Dr. Sharol Given 3/30 due to osteoarthritis 2. ESRD - MWF - nocturnal via AVF  - will run at previous time of 4 hrs at higher BFR/DFR- hold heparin due to surgery - HD tomorrow per usual schedule 3. Hypertension/volume - On Norvasc 5 as OP Has been leaving below edw between 98.4 and 99.7 the past four treamtents with minimal UF 0 - 1.3 - needs EDW lowered BP pre HD 160/90s +/- to 130 standing and 130 - 150 sitting post HD- will be hard to get an accurate edw due to knee brace- holding norvasc for now 4. Anemia - follow CBC post op - resume ESA if indicated - last outpt Hgb 11.3 3/23 - continue weekly Fe- hgb now in the 10's - will give ESA tomorrow 5. Metabolic bone disease - Continue calcitriol and calcium acetate 1 ac/(not taking sensipar yet though it is on his outpt med list) 6. Nutrition - CL to advance to renal/carb mod + vitamin 7. DM - continue glucotrol - DM well controlled 8. Dispo- ortho says discharge Fri or Sat  [name] A    Labs: Basic Metabolic Panel:  Recent Labs Lab 04/10/14 0655 04/10/14 1459  NA 140 139  K 4.3 4.3  CL  --  101  CO2  --  25  GLUCOSE 114* 147*  BUN   --  32*  CREATININE  --  5.40*  CALCIUM  --  8.8   Liver Function Tests: No results for input(s): AST, ALT, ALKPHOS, BILITOT, PROT, ALBUMIN in the last 168 hours. No results for input(s): LIPASE, AMYLASE in the last 168 hours. No results for input(s): AMMONIA in the last 168 hours. CBC:  Recent Labs Lab 04/10/14 0655 04/10/14 1459  WBC  --  9.7  HGB 12.2* 10.3*  HCT 36.0* 31.9*  MCV  --  89.6  PLT  --  182   Cardiac Enzymes: No results for input(s): CKTOTAL, CKMB, CKMBINDEX, TROPONINI in the last 168 hours. CBG:  Recent Labs Lab 04/10/14 1041 04/10/14 2138 04/11/14 0634  GLUCAP 94 190* 128*    Iron Studies: No results for input(s): IRON, TIBC, TRANSFERRIN, FERRITIN in the last 72 hours. Studies/Results: No results found. Medications: Infusions: . sodium chloride 10 mL/hr at 04/10/14 1118    Scheduled Medications: . amitriptyline  50 mg Oral QHS  . amLODipine  5 mg Oral Daily  . aspirin EC  325 mg Oral Q breakfast  . calcium acetate  667 mg Oral TID WC  .  docusate sodium  100 mg Oral BID  . [START ON 04/12/2014] doxercalciferol  2 mcg Intravenous Q M,W,F-HD  . famotidine  10 mg Oral Daily  . [START ON 04/17/2014] ferric gluconate (FERRLECIT/NULECIT) IV  62.5 mg Intravenous Q Wed-HD  . gabapentin  200 mg Oral BID  . glipiZIDE  5 mg Oral QAC breakfast  . insulin aspart  0-9 Units Subcutaneous TID WC  . ketorolac  15 mg Intravenous 4 times per day  . multivitamin  1 tablet Oral QHS    have reviewed scheduled and prn medications.  Physical Exam: General: anxious with getting up Heart: RRR Lungs:  clear Abdomen: soft, non tender Extremities: left leg wrapped- minimal edema Dialysis Access: AVF with bruit     04/11/2014,9:37 AM  LOS: 1 day

## 2014-04-12 LAB — RENAL FUNCTION PANEL
ANION GAP: 7 (ref 5–15)
Albumin: 2.5 g/dL — ABNORMAL LOW (ref 3.5–5.2)
BUN: 35 mg/dL — ABNORMAL HIGH (ref 6–23)
CHLORIDE: 101 mmol/L (ref 96–112)
CO2: 27 mmol/L (ref 19–32)
Calcium: 8.1 mg/dL — ABNORMAL LOW (ref 8.4–10.5)
Creatinine, Ser: 6.37 mg/dL — ABNORMAL HIGH (ref 0.50–1.35)
GFR, EST AFRICAN AMERICAN: 10 mL/min — AB (ref >90)
GFR, EST NON AFRICAN AMERICAN: 9 mL/min — AB (ref >90)
GLUCOSE: 111 mg/dL — AB (ref 70–99)
PHOSPHORUS: 6.8 mg/dL — AB (ref 2.3–4.6)
POTASSIUM: 4.2 mmol/L (ref 3.5–5.1)
SODIUM: 135 mmol/L (ref 135–145)

## 2014-04-12 LAB — CBC
HCT: 26.1 % — ABNORMAL LOW (ref 39.0–52.0)
HEMOGLOBIN: 8.6 g/dL — AB (ref 13.0–17.0)
MCH: 29.8 pg (ref 26.0–34.0)
MCHC: 33 g/dL (ref 30.0–36.0)
MCV: 90.3 fL (ref 78.0–100.0)
Platelets: 149 10*3/uL — ABNORMAL LOW (ref 150–400)
RBC: 2.89 MIL/uL — ABNORMAL LOW (ref 4.22–5.81)
RDW: 13.9 % (ref 11.5–15.5)
WBC: 8.6 10*3/uL (ref 4.0–10.5)

## 2014-04-12 LAB — GLUCOSE, CAPILLARY
GLUCOSE-CAPILLARY: 146 mg/dL — AB (ref 70–99)
Glucose-Capillary: 103 mg/dL — ABNORMAL HIGH (ref 70–99)
Glucose-Capillary: 150 mg/dL — ABNORMAL HIGH (ref 70–99)

## 2014-04-12 MED ORDER — OXYCODONE-ACETAMINOPHEN 5-325 MG PO TABS: 1.0000 | ORAL_TABLET | ORAL | Status: DC | PRN

## 2014-04-12 MED ORDER — NEPRO/CARBSTEADY PO LIQD: 237.0000 mL | ORAL | Status: DC | PRN

## 2014-04-12 MED ORDER — DOXERCALCIFEROL 4 MCG/2ML IV SOLN
INTRAVENOUS | Status: AC
  Filled 2014-04-12: qty 2

## 2014-04-12 MED ORDER — HEPARIN SODIUM (PORCINE) 1000 UNIT/ML DIALYSIS: 1000.0000 [IU] | INTRAMUSCULAR | Status: DC | PRN

## 2014-04-12 MED ORDER — HYDROMORPHONE HCL 1 MG/ML IJ SOLN
INTRAMUSCULAR | Status: AC
  Filled 2014-04-12: qty 1

## 2014-04-12 MED ORDER — ALTEPLASE 2 MG IJ SOLR: 2.0000 mg | Freq: Once | INTRAMUSCULAR | Status: DC | PRN

## 2014-04-12 MED ORDER — LIDOCAINE-PRILOCAINE 2.5-2.5 % EX CREA: 1.0000 "application " | TOPICAL_CREAM | CUTANEOUS | Status: DC | PRN

## 2014-04-12 MED ORDER — LIDOCAINE HCL (PF) 1 % IJ SOLN: 5.0000 mL | INTRAMUSCULAR | Status: DC | PRN

## 2014-04-12 MED ORDER — SODIUM CHLORIDE 0.9 % IV SOLN: 100.0000 mL | INTRAVENOUS | Status: DC | PRN

## 2014-04-12 MED ORDER — PENTAFLUOROPROP-TETRAFLUOROETH EX AERO: 1.0000 "application " | INHALATION_SPRAY | CUTANEOUS | Status: DC | PRN

## 2014-04-12 MED ORDER — DARBEPOETIN ALFA 60 MCG/0.3ML IJ SOSY
PREFILLED_SYRINGE | INTRAMUSCULAR | Status: AC
  Filled 2014-04-12: qty 0.3

## 2014-04-12 NOTE — Progress Notes (Signed)
PT Cancellation Note  Patient Details Name: Matthew Kline MRN: [id number] DOB: March 07, 1960   Cancelled Treatment:    Reason Eval/Treat Not Completed: Patient at procedure or test/unavailable (in HD).  PT will check in this PM after HD.    Matthew Kline Danah Reinecke, [place], DPT [phone]   04/12/2014, 10:30 AM

## 2014-04-12 NOTE — Discharge Summary (Signed)
Physician Discharge Summary  [name] MRN: [id number] DOB/AGE: February 11, 1960 54 y.o.  Admit date: 04/10/2014 Discharge date: 04/12/2014  Admission Diagnoses: Osteoarthritis left knee  Discharge Diagnoses:  Active Problems:   Total knee replacement status   Discharged Condition: stable  Hospital Course: Patient's hospital course was essentially unremarkable. He underwent total knee arthroplasty postoperatively he progressed well with therapy and was discharged to home in stable condition.  Consults: nephrology  Significant Diagnostic Studies: labs: Routine labs  Treatments: dialysis: Hemodialysis and surgery: See operative note  Discharge Exam: Blood pressure 132/70, pulse 102, temperature 98.4 F (36.9 C), temperature source Oral, resp. rate 18, weight 100.9 kg (222 lb 7.1 oz), SpO2 97 %. Incision/Wound: clean and dry  Disposition: 01-Home or Self Care  Discharge Instructions    Call MD / Call 911    Complete by:  As directed   If you experience chest pain or shortness of breath, CALL 911 and be transported to the hospital emergency room.  If you develope a fever above 101 F, pus (white drainage) or increased drainage or redness at the wound, or calf pain, call your surgeon's office.     Constipation Prevention    Complete by:  As directed   Drink plenty of fluids.  Prune juice may be helpful.  You may use a stool softener, such as Colace (over the counter) 100 mg twice a day.  Use MiraLax (over the counter) for constipation as needed.     Diet - low sodium heart healthy    Complete by:  As directed      Increase activity slowly as tolerated    Complete by:  As directed      Weight bearing as tolerated    Complete by:  As directed             Medication List    TAKE these medications        amitriptyline 50 MG tablet  Commonly known as:  ELAVIL  Take 1 tablet (50 mg total) by mouth at bedtime.     amLODipine 5 MG tablet  Commonly known as:   NORVASC  Take 1 tablet by mouth daily.     calcium acetate 667 MG capsule  Commonly known as:  PHOSLO  Take 667 mg by mouth 3 (three) times daily with meals.     gabapentin 100 MG capsule  Commonly known as:  NEURONTIN  Take 2 capsules (200 mg total) by mouth 2 (two) times daily.     glipiZIDE 5 MG tablet  Commonly known as:  GLUCOTROL  Take 1 tablet (5 mg total) by mouth daily before breakfast.     oxyCODONE-acetaminophen 5-325 MG per tablet  Commonly known as:  ROXICET  Take 1 tablet by mouth every 4 (four) hours as needed for severe pain.     ranitidine 150 MG tablet  Commonly known as:  ZANTAC  Take 150 mg by mouth daily as needed.     sildenafil 25 MG tablet  Commonly known as:  VIAGRA  Take 2 tablets (50 mg total) by mouth daily as needed for erectile dysfunction.     VITAMIN D PO  Take 1 tablet by mouth 3 (three) times a week. At dialysis     zolpidem 10 MG tablet  Commonly known as:  AMBIEN  Take 1 tablet (10 mg total) by mouth at bedtime as needed for sleep.           Follow-up Information  Follow up with [facility].   Why:  Someone from [facility] will contact you concerning start date and time for therapy.   Contact information:   [address] [place] [address] [phone]       Follow up with Tamecca Artiga V, MD In 2 weeks.   Specialty:  Orthopedic Surgery   Contact information:   [place] [address] [phone]       Signed: [name] 04/12/2014, 6:41 AM

## 2014-04-12 NOTE — Progress Notes (Signed)
Subjective:   Knee is 'rough' but therapy is going ok, for DC today. Lots of pain with HD  Objective Filed Vitals:   04/12/14 0724 04/12/14 0735 04/12/14 0740 04/12/14 0800  BP: 124/73 128/77 136/78 129/75  Pulse: 105 105 103 105  Temp: 98.3 F (36.8 C)     TempSrc: Oral     Resp: 18     Weight: 100.4 kg (221 lb 5.5 oz)     SpO2: 92%      Physical Exam General: alert and oriented on HD.  No acute distress Heart: RRR Lungs: CTA, unlabored Abdomen: soft, nontender +BS Extremities: L knee brace. Trace edema  Dialysis Access:  L AVF patent on HD  Dialysis Orders: Center:[facility] - Nocturnal MWF 8 hours 300/500 2K 2 Ca left upper AVF heparin 4000 with 5 K mid tmt no Mircera, venofer 50/week calcitriol 2 Recent labs: Hgb 11.3 3/23 stable iPTH 431 2/10   Assessment/Plan: 1. s/p LTKR Dr. Sharol Given 3/30 due to osteoarthritis 2. ESRD - MWF - nocturnal via AVF - HD today uf goal 2500- he may need to sign off early due to pain 3. Hypertension/volume - On Norvasc 5 as OP Has been leaving below edw between 98.4 and 99.7 the past four treamtents with minimal UF 0 - 1.3 - needs EDW lowered BP pre HD 160/90s +/- to 130 standing and 130 - 150 sitting post HD- will be hard to get an accurate edw due to knee brace and bed wts in hosp- holding norvasc for now 4. Anemia -  hgb down to 8.6 post op. Give Aranesp today -  last outpt Hgb 11.3 3/23 - continue weekly Fe 5. Metabolic bone disease - Ca+ 8.1/ phos 6.8 Continue calcitriol and calcium acetate 1 ac/(not taking sensipar yet though it is on his outpt med list) 6. Nutrition - alb 2.5  renal/carb mod + vitamin 7. DM - continue glucotrol - DM well controlled 8. Dispo- DC today with home PT  Shelle Iron, NP [place] [phone] 04/12/2014,8:30 AM  LOS: 2 days    Patient seen and examined, agree with above note with above modifications. Having pain in HD- maybe going home today ?  Corliss Parish,  MD 04/12/2014      Additional Objective Labs: Basic Metabolic Panel:  Recent Labs Lab 04/10/14 0655 04/10/14 1459 04/12/14 0756  NA 140 139 135  K 4.3 4.3 4.2  CL  --  101 101  CO2  --  25 27  GLUCOSE 114* 147* 111*  BUN  --  32* 35*  CREATININE  --  5.40* 6.37*  CALCIUM  --  8.8 8.1*  PHOS  --   --  6.8*   Liver Function Tests:  Recent Labs Lab 04/12/14 0756  ALBUMIN 2.5*   No results for input(s): LIPASE, AMYLASE in the last 168 hours. CBC:  Recent Labs Lab 04/10/14 0655 04/10/14 1459 04/12/14 0756  WBC  --  9.7 8.6  HGB 12.2* 10.3* 8.6*  HCT 36.0* 31.9* 26.1*  MCV  --  89.6 90.3  PLT  --  182 149*   Blood Culture    Component Value Date/Time   SDES URINE, RANDOM 01/03/2012 1335   SPECREQUEST Normal 01/03/2012 1335   CULT YEAST 01/03/2012 1335   REPTSTATUS 01/06/2012 FINAL 01/03/2012 1335    Cardiac Enzymes: No results for input(s): CKTOTAL, CKMB, CKMBINDEX, TROPONINI in the last 168 hours. CBG:  Recent Labs Lab 04/11/14 [phone] 04/11/14 1157 04/11/14 1712 04/11/14 2055 04/12/14  0655  GLUCAP 128* 109* 88 123* 103*   Iron Studies: No results for input(s): IRON, TIBC, TRANSFERRIN, FERRITIN in the last 72 hours. @lablastinr3 @ Studies/Results: No results found. Medications: . sodium chloride 10 mL/hr at 04/12/14 0235   . amitriptyline  50 mg Oral QHS  . aspirin EC  325 mg Oral Q breakfast  . calcium acetate  667 mg Oral TID WC  . darbepoetin (ARANESP) injection - DIALYSIS  60 mcg Intravenous Q Fri-HD  . docusate sodium  100 mg Oral BID  . doxercalciferol  2 mcg Intravenous Q M,W,F-HD  . famotidine  10 mg Oral Daily  . [START ON 04/17/2014] ferric gluconate (FERRLECIT/NULECIT) IV  62.5 mg Intravenous Q Wed-HD  . gabapentin  200 mg Oral BID  . glipiZIDE  5 mg Oral QAC breakfast  . HYDROmorphone      . insulin aspart  0-9 Units Subcutaneous TID WC  . multivitamin  1 tablet Oral QHS

## 2014-04-12 NOTE — Progress Notes (Signed)
Occupational Therapy Treatment Patient Details Name: Matthew Kline MRN: [id number] DOB: January 24, 1960 Today's Date: 04/12/2014    History of present illness Matthew Kline, 54 y.o. male, has a history of pain and functional disability in the left knee due to arthritis and has failed non-surgical conservative treatments for greater than 12 weeks.  Pt s/p L TKA.  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    OT comments  Pt not seen in am due to dialysis. Appeared confused initially this pm. Poor carry over of information. Unsafe with mobility at times. L knee buckling at times with KI. Do not feel pt safe to D/C home this pm - discussed with PT. Plan to see pt in am for family education on shower transfer techniques and home safety.   Follow Up Recommendations  No OT follow up;Supervision/Assistance - 24 hour    Equipment Recommendations  3 in 1 bedside comode    Recommendations for Other Services      Precautions / Restrictions Precautions Precautions: Knee Required Braces or Orthoses: Knee Immobilizer - Left Knee Immobilizer - Left: On when out of bed or walking Restrictions LLE Weight Bearing: Weight bearing as tolerated       Mobility Bed Mobility Overal bed mobility: Modified Independent                Transfers Overall transfer level: Needs assistance Equipment used: Rolling walker (2 wheeled) Transfers: Sit to/from [facility] Sit to Stand: Min guard Stand pivot transfers: Min A - mod vc for correct sequencing of RW.       General transfer comment: Appears impulsive during mobility. Difficulty with correct/safe sequencing of RW during functional mobility    Balance Overall balance assessment: Needs assistance Sitting-balance support: Feet supported;No upper extremity supported Sitting balance-Leahy Scale: Good     Standing balance support: No upper extremity  supported;Bilateral upper extremity supported;Single extremity supported Standing balance-Leahy Scale: Poor Standing balance comment: heavy reliance on RW                   ADL Overall ADL's : Needs assistance/impaired                     Lower Body Dressing: Moderate assistance;Sit to/from stand   Toilet Transfer: Minimal assistance Armed forces technical officer Details (indicate cue type and reason): L knee buckling when in knee brace Toileting- Clothing Manipulation and Hygiene: Minimal assistance   Tub/ Shower Transfer: Moderate assistance;Cueing for sequencing;Cueing for safety Tub/Shower Transfer Details (indicate cue type and reason): Educated pt on transfer technique. Pt unable to safely return demonstrate at this time. Attempting to step backwards with weak LLE regardless of education. Recommended that pt sponge bath until he is safely able to transfer over threshold. Recommend pt have physical assist with any functional transfer at this time. Functional mobility during ADLs: Minimal assistance;Rolling walker;Cueing for safety;Cueing for sequencing General ADL Comments: Pt states wife will assist with ADL                                      Cognition   Behavior During Therapy: Impulsive Overall Cognitive Status: No family/caregiver present to determine baseline cognitive functioning  impulsive; slow processing; poor STM; impaired safety/judgement     Memory: Decreased short-term memory (Did not remember session from yesterday)  Extremity/Trunk Assessment               Exercises Total Joint Exercises Ankle Circles/Pumps: AROM;Both;20 reps;Supine Quad Sets: AROM;Both;10 reps;Supine Towel Squeeze: AROM;Both;10 reps;Supine Heel Slides: AAROM;Left;10 reps;Supine Goniometric ROM: 15-75 AAROM reclined in recliner chair.    Shoulder Instructions       General Comments      Pertinent Vitals/ Pain       Pain Assessment: 0-10 Pain  Score: 9  Pain Location: L knee Pain Descriptors / Indicators: Aching;Constant;Jabbing Pain Intervention(s): Limited activity within patient's tolerance;Monitored during session;Repositioned  Home Living                                          Prior Functioning/Environment              Frequency Min 2X/week     Progress Toward Goals  OT Goals(current goals can now be found in the care plan section)  Progress towards OT goals: Progressing toward goals  Acute Rehab OT Goals Patient Stated Goal: Return home OT Goal Formulation: With patient ADL Goals Pt Will Transfer to Toilet: with supervision;ambulating;bedside commode Pt Will Perform Toileting - Clothing Manipulation and hygiene: with supervision;sit to/from stand;with caregiver independent in assisting Pt Will Perform Tub/Shower Transfer: 3 in 1;rolling walker;ambulating;with caregiver independent in assisting;with min guard assist  Plan Discharge plan needs to be updated    Co-evaluation                 End of Session Equipment Utilized During Treatment: Gait belt;Rolling walker;Other (comment) (knee brace)   Activity Tolerance Patient tolerated treatment well   Patient Left in chair;with call bell/phone within reach   Nurse Communication Mobility status        Time: [id number] OT Time Calculation (min): 26 min  Charges: OT General Charges $OT Visit: 1 Procedure OT Treatments $Self Care/Home Management : 23-37 mins  Matthew Kline,[name] 04/12/2014, 5:46 PM   [facility], OTR/L  [phone] 04/12/2014

## 2014-04-12 NOTE — Progress Notes (Signed)
Physical Therapy Treatment Patient Details Name: Matthew Kline MRN: [id number] DOB: [birth year] Today's Date: 04/12/2014    History of Present Illness Matthew Kline, 54 y.o. male, has a history of pain and functional disability in the left knee due to arthritis and has failed non-surgical conservative treatments for greater than 12 weeks.  Pt s/p L TKA.  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     PT Comments    Pt is POD #2, but missed his AM session due to HD treatment.  He continues to be unsafe, with decreased carryover of precautions and safety education.  He continues to require constant hands-on assist for all mobility and gait to be safe and no family has been available for family education.  This patient is not safe for and is not physically ready for discharge today.  I believe if he gets two good therapy sessions in tomorrow and there is family present to reinforce education then he will be safe to d/c home tomorrow PM.  MD paged and d/c held. RN made aware as well as pt.    Follow Up Recommendations  Home health PT;Supervision for mobility/OOB     Equipment Recommendations  None recommended by PT    Recommendations for Other Services   NA     Precautions / Restrictions Precautions Precautions: Knee Required Braces or Orthoses: Knee Immobilizer - Left Knee Immobilizer - Left: On when out of bed or walking Restrictions LLE Weight Bearing: Weight bearing as tolerated    Mobility             Transfers Overall transfer level: Needs assistance Equipment used: Rolling walker (2 wheeled) Transfers: Sit to/from Stand Sit to Stand: Min guard         General transfer comment: Min guard assist for safety due to fast speed of movement and need to stabilize RW during transitions.    Ambulation/Gait Ambulation/Gait assistance: Min assist Ambulation Distance (Feet): 40 Feet Assistive device:  Rolling walker (2 wheeled) Gait Pattern/deviations: Step-to pattern;Antalgic Gait velocity: decreased Gait velocity interpretation: Below normal speed for age/gender General Gait Details: Pt with moderately antalgic gait pattern, buckling even throguh KI.  Heavy reliance on hands for support and pt's arms fatigue quickly.  Verbal cues to reinforce correct LE sequencing and safe RW use.           Balance Overall balance assessment: Needs assistance Sitting-balance support: Feet supported;No upper extremity supported Sitting balance-Leahy Scale: Good     Standing balance support: No upper extremity supported;Bilateral upper extremity supported;Single extremity supported Standing balance-Leahy Scale: Poor                      Cognition Arousal/Alertness: Awake/alert Behavior During Therapy: WFL for tasks assessed/performed Overall Cognitive Status: Within Functional Limits for tasks assessed                      Exercises Total Joint Exercises Ankle Circles/Pumps: AROM;Both;20 reps;Supine Quad Sets: AROM;Both;10 reps;Supine Towel Squeeze: AROM;Both;10 reps;Supine Heel Slides: AAROM;Left;10 reps;Supine Goniometric ROM: 15-75 AAROM reclined in recliner chair.     General Comments General comments (skin integrity, edema, etc.): Re adjusted his KI stays, they were not in the correct position to support his knee during gait.       Pertinent Vitals/Pain Pain Assessment: 0-10 Pain Score: 9  Pain Location: left knee Pain Descriptors / Indicators: Aching;Burning Pain Intervention(s): Limited activity within patient's tolerance;Monitored  during session;Repositioned;Premedicated before session (pt declined ice pack at end of session. )           PT Goals (current goals can now be found in the care plan section) Acute Rehab PT Goals Patient Stated Goal: Return home Progress towards PT goals: Progressing toward goals    Frequency  7X/week    PT Plan Current  plan remains appropriate       End of Session Equipment Utilized During Treatment: Gait belt;Left knee immobilizer Activity Tolerance: Patient limited by pain;Patient limited by fatigue Patient left: in chair;with call bell/phone within reach     Time: 1652-1726 PT Time Calculation (min) (ACUTE ONLY): 34 min  Charges:  $Gait Training: 8-22 mins $Therapeutic Exercise: 8-22 mins                      Jaelle Campanile B. Purcell, [place], DPT [phone]   04/12/2014, 5:35 PM

## 2014-04-12 NOTE — Procedures (Signed)
Patient was seen on dialysis and the procedure was supervised.  BFR 400  Via AVF BP is  114/54.   Patient appears to be tolerating treatment well  Zsofia Prout A 04/12/2014

## 2014-04-12 NOTE — Progress Notes (Signed)
Patient is due to void.  Bladder scan revealed 480 ml at 0230 on 04/12/14.  Patient has no complaints of pain or discomfort.  Dialysis scheduled for later today.  Paged on call - Dr. Marlou Sa.  No new orders at this time.  Will continue to monitor patient.

## 2014-04-13 LAB — GLUCOSE, CAPILLARY
GLUCOSE-CAPILLARY: 108 mg/dL — AB (ref 70–99)
Glucose-Capillary: 104 mg/dL — ABNORMAL HIGH (ref 70–99)

## 2014-04-13 NOTE — Progress Notes (Signed)
Dressings c/d/i.  Patient is stable.  Continue PT.  Pain is controlled.  Plan is to dc home today after PT.  Rx in chart.  Azucena Cecil, MD [place] 8:00 AM

## 2014-04-13 NOTE — Progress Notes (Signed)
Physical Therapy Treatment Patient Details Name: Matthew Kline MRN: [id number] DOB: 06/12/1960 Today's Date: 04/13/2014    History of Present Illness Matthew Kline, 54 y.o. male, has a history of pain and functional disability in the left knee due to arthritis and has failed non-surgical conservative treatments for greater than 12 weeks.  Pt s/p L TKA.  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     PT Comments    Pt is making faster progress now and wife was present for stair education and our session this PM.  Pt is due to d/c home with wife's and family's assist.  PT will follow acutely until d/c home confirmed.   Follow Up Recommendations  Home health PT;Supervision for mobility/OOB     Equipment Recommendations  None recommended by PT    Recommendations for Other Services   NA     Precautions / Restrictions Precautions Precautions: Knee;Fall Precaution Booklet Issued: Yes (comment) Required Braces or Orthoses: Knee Immobilizer - Left Knee Immobilizer - Left: On when out of bed or walking Restrictions LLE Weight Bearing: Weight bearing as tolerated    Mobility             Transfers Overall transfer level: Needs assistance Equipment used: Rolling walker (2 wheeled) Transfers: Sit to/from Stand Sit to Stand: Supervision         General transfer comment: supervision for safety.  Pt able to demonstrate safe hand placement during transitions.   Ambulation/Gait Ambulation/Gait assistance: Supervision Ambulation Distance (Feet): 180 Feet Assistive device: Rolling walker (2 wheeled) Gait Pattern/deviations: Step-through pattern;Antalgic Gait velocity: too fast for safety, verbal cues to slow down.    General Gait Details: Pt continues to have moderately antalgic gait pattern with poor quad control and flexed left knee in stance that buckles despite having KI donned.  Better foot contact  this afternoon during gait than AM session.    Stairs Stairs: Yes Stairs assistance: Min assist Stair Management: No rails;Backwards;With walker;Step to pattern Number of Stairs: 2 General stair comments: Min assist to stabilize RW, max verbal cues for correct LE sequencing.  Wife present for education.  Educated if there are two people helping that the strongest one is in the front and the weaker one in the back to steady him for balance.       Balance Overall balance assessment: Needs assistance Sitting-balance support: Feet supported;No upper extremity supported Sitting balance-Leahy Scale: Good     Standing balance support: Single extremity supported;Bilateral upper extremity supported;No upper extremity supported Standing balance-Leahy Scale: Poor                      Cognition Arousal/Alertness: Awake/alert Behavior During Therapy: Impulsive Overall Cognitive Status: Impaired/Different from baseline Area of Impairment: Memory;Safety/judgement     Memory: Decreased recall of precautions;Decreased short-term memory   Safety/Judgement: Decreased awareness of safety          Exercises Total Joint Exercises Long Arc Quad: AROM;Left;10 reps;Seated Knee Flexion: AROM;AAROM;10 reps;Seated Goniometric ROM: 12-95 AAROM seated        Pertinent Vitals/Pain Pain Assessment: 0-10 Pain Score: 7  Pain Location: left knee Pain Descriptors / Indicators: Aching;Burning Pain Intervention(s): Limited activity within patient's tolerance;Monitored during session;Repositioned           PT Goals (current goals can now be found in the care plan section) Acute Rehab PT Goals Patient Stated Goal: Return home Progress towards PT goals: Progressing toward goals  Frequency  7X/week    PT Plan Current plan remains appropriate       End of Session Equipment Utilized During Treatment: Left knee immobilizer Activity Tolerance: Patient limited by pain Patient left:  in chair;with call bell/phone within reach;with family/visitor present     Time: 1345-1420 PT Time Calculation (min) (ACUTE ONLY): 35 min  Charges:  $Gait Training: 8-22 mins $Therapeutic Exercise: 8-22 mins           Karita Dralle B. [place], [place], DPT [phone]   04/13/2014, 2:27 PM

## 2014-04-13 NOTE — Progress Notes (Signed)
Physical Therapy Treatment Patient Details Name: Matthew Kline MRN: [id number] DOB: Jun 24, 1960 Today's Date: 04/13/2014    History of Present Illness Matthew Kline, 54 y.o. male, has a history of pain and functional disability in the left knee due to arthritis and has failed non-surgical conservative treatments for greater than 12 weeks.  Pt s/p L TKA.  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     PT Comments    Pt is progressing slowly with his mobility.  He still has extensive gait deviations requiring constant hands on assist for safety/to prevent falls.  I learned two new things today: he has dyskinesia (explaining some of his abnormal, jerky movements), and that his wife has MS (I am not sure how much physical help she can provide.  Per pt, his wife will be here at 2 pm and PT plans to return at that time to reinforce stair training, safety with gait and HEP.  PT to continue to follow acutely until d/c.    Follow Up Recommendations  Home health PT;Supervision for mobility/OOB     Equipment Recommendations  None recommended by PT    Recommendations for Other Services   NA     Precautions / Restrictions Precautions Precautions: Knee Precaution Booklet Issued: Yes (comment) Precaution Comments: reinforced no pillow under surgical knee Required Braces or Orthoses: Knee Immobilizer - Left Knee Immobilizer - Left: On when out of bed or walking Restrictions Weight Bearing Restrictions: Yes LLE Weight Bearing: Weight bearing as tolerated    Mobility           Transfers Overall transfer level: Needs assistance Equipment used: Rolling walker (2 wheeled) Transfers: Sit to/from Stand Sit to Stand: Supervision         General transfer comment: Supervision for safety.  Verbal cues for safe hand placement.   Ambulation/Gait Ambulation/Gait assistance: Min guard Ambulation Distance (Feet): 60  Feet Assistive device: Rolling walker (2 wheeled) Gait Pattern/deviations: Step-to pattern;Antalgic;Trunk flexed (decreased heel strike on the left) Gait velocity: decreased Gait velocity interpretation: Below normal speed for age/gender General Gait Details: Pt with moderately antalgic gait pattern over flexed left knee (even in KI).  Decreased heel strike on left during initial foot contact, verbal cues to try to get his whole left foot in contact with the floor.  Heavy reliance on hands for support during gait.  Limited gait distance due to fatigue and pain.  Chair to follow to try to encourage increased gait distance.    Stairs Stairs: Yes Stairs assistance: Min assist Stair Management: No rails;Step to pattern;Backwards;With walker Number of Stairs: 2 General stair comments: Min assist to stabilize RW, max verbal cues for LE sequencing and safety with RW. Pt informed me at the end of session that his wife has MS (I am not sure if she can physically stabilize the RW enough to get him in the house).  He may have to rely on his two teenagers who are still at home to help with home entry and exit.  Will need to reinforce stair education with wife present this PM.          Balance Overall balance assessment: Needs assistance Sitting-balance support: Feet supported;No upper extremity supported Sitting balance-Leahy Scale: Good     Standing balance support: No upper extremity supported;Bilateral upper extremity supported;Single extremity supported Standing balance-Leahy Scale: Poor  Cognition Arousal/Alertness: Lethargic;Suspect due to medications Behavior During Therapy: Impulsive Overall Cognitive Status: Within Functional Limits for tasks assessed                      Exercises Total Joint Exercises Short Arc Quad: AROM;Left;10 reps;Supine Hip ABduction/ADduction: AROM;Left;10 reps;Supine (cues to keep knee straight) Straight Leg Raises:  AROM;Left;10 reps;Supine (cues to keep knee straight)        Pertinent Vitals/Pain Pain Assessment: 0-10 Pain Score: 7  Pain Location: left knee Pain Descriptors / Indicators: Aching;Burning Pain Intervention(s): Limited activity within patient's tolerance;Monitored during session;Repositioned           PT Goals (current goals can now be found in the care plan section) Acute Rehab PT Goals Patient Stated Goal: Return home Progress towards PT goals: Progressing toward goals    Frequency  7X/week    PT Plan Current plan remains appropriate       End of Session Equipment Utilized During Treatment: Left knee immobilizer Activity Tolerance: Patient limited by fatigue;Patient limited by pain Patient left: in chair;with call bell/phone within reach     Time: 0850-0913 PT Time Calculation (min) (ACUTE ONLY): 23 min  Charges:  $Gait Training: 23-37 mins                      Matthew Kline, [place], DPT [phone]   04/13/2014, 9:23 AM

## 2014-04-13 NOTE — Discharge Instructions (Signed)

## 2014-04-13 NOTE — Progress Notes (Signed)
Occupational Therapy Treatment Patient Details Name: Matthew Kline MRN: [id number] DOB: Nov 08, 1960 Today's Date: 04/13/2014    History of present illness Matthew Kline, 54 y.o. male, has a history of pain and functional disability in the left knee due to arthritis and has failed non-surgical conservative treatments for greater than 12 weeks.  Pt s/p L TKA.  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    OT comments  Focus of session on toilet transfer onto 3 in 1 over toilet and shower transfer onto 3 in 1. Supervision required for toilet transfers for safety and min assist for shower transfers. Pt does not have the ability to step over the edge of a tub.  Instructed to use walk in shower until he practices tub transfer with HHPT.  Pt agreed.  Follow Up Recommendations  No OT follow up;Supervision/Assistance - 24 hour    Equipment Recommendations  3 in 1 bedside comode    Recommendations for Other Services      Precautions / Restrictions Precautions Precautions: Knee;Fall Precaution Booklet Issued: Yes (comment) Precaution Comments: reinforced no pillow under surgical knee Required Braces or Orthoses: Knee Immobilizer - Left Knee Immobilizer - Left: On when out of bed or walking Restrictions Weight Bearing Restrictions: Yes LLE Weight Bearing: Weight bearing as tolerated       Mobility Bed Mobility               General bed mobility comments: Pt received sitting in chair  Transfers Overall transfer level: Needs assistance Equipment used: Rolling walker (2 wheeled) Transfers: Sit to/from Stand Sit to Stand: Supervision         General transfer comment: Supervision for safety.  Verbal cues for safe hand placement.     Balance Overall balance assessment: Needs assistance Sitting-balance support: Feet supported;No upper extremity supported Sitting balance-Leahy Scale: Good     Standing  balance support: No upper extremity supported;Bilateral upper extremity supported;Single extremity supported Standing balance-Leahy Scale: Poor                     ADL Overall ADL's : Needs assistance/impaired                         Toilet Transfer: BSC;Ambulation;Supervision/safety (BSC over toilet)   Toileting- Clothing Manipulation and Hygiene: Sit to/from stand;Supervision/safety   Tub/ Shower Transfer: Minimal assistance;Ambulation;3 in 1;Rolling walker Tub/Shower Transfer Details (indicate cue type and reason): Simulated shower stall transfers onto 3 in 1 with min assist. Functional mobility during ADLs: Rolling walker;Cueing for sequencing;Cueing for safety;Supervision/safety        Vision                     Perception     Praxis      Cognition   Behavior During Therapy: Impulsive Overall Cognitive Status: Impaired/Different from baseline Area of Impairment: Memory     Memory: Decreased short-term memory               Extremity/Trunk Assessment               Exercises Total Joint Exercises Short Arc Quad: AROM;Left;10 reps;Supine Hip ABduction/ADduction: AROM;Left;10 reps;Supine (cues to keep knee straight) Straight Leg Raises: AROM;Left;10 reps;Supine (cues to keep knee straight)   Shoulder Instructions       General Comments      Pertinent Vitals/ Pain       Pain  Assessment: Faces Pain Score: 7  Faces Pain Scale: Hurts even more Pain Location: L knee Pain Descriptors / Indicators: Aching Pain Intervention(s): Limited activity within patient's tolerance;Monitored during session;Premedicated before session;Repositioned  Home Living                                          Prior Functioning/Environment              Frequency Min 2X/week     Progress Toward Goals  OT Goals(current goals can now be found in the care plan section)  Progress towards OT goals: Progressing toward  goals  Acute Rehab OT Goals Patient Stated Goal: Return home  Plan Discharge plan remains appropriate    Co-evaluation                 End of Session Equipment Utilized During Treatment: Rolling walker;Gait belt (KI)   Activity Tolerance Patient tolerated treatment well   Patient Left in chair;with call bell/phone within reach   Nurse Communication          Time: [phone] OT Time Calculation (min): 28 min  Charges: OT General Charges $OT Visit: 1 Procedure OT Treatments $Self Care/Home Management : 23-37 mins  Malka So 04/13/2014, 10:08 AM

## 2014-04-16 ENCOUNTER — Encounter (HOSPITAL_COMMUNITY): Payer: Self-pay | Admitting: Orthopedic Surgery

## 2014-04-26 ENCOUNTER — Ambulatory Visit (INDEPENDENT_AMBULATORY_CARE_PROVIDER_SITE_OTHER): Payer: Commercial Managed Care - HMO | Admitting: Internal Medicine

## 2014-04-26 ENCOUNTER — Encounter: Payer: Self-pay | Admitting: Internal Medicine

## 2014-04-26 VITALS — BP 150/80 | HR 104 | Temp 98.5°F | Resp 20 | Ht 70.0 in | Wt 218.4 lb

## 2014-04-26 DIAGNOSIS — I10 Essential (primary) hypertension: Secondary | ICD-10-CM

## 2014-04-26 DIAGNOSIS — S8392XA Sprain of unspecified site of left knee, initial encounter: Secondary | ICD-10-CM | POA: Diagnosis not present

## 2014-04-26 DIAGNOSIS — E1143 Type 2 diabetes mellitus with diabetic autonomic (poly)neuropathy: Secondary | ICD-10-CM | POA: Diagnosis not present

## 2014-04-26 DIAGNOSIS — Z96652 Presence of left artificial knee joint: Secondary | ICD-10-CM | POA: Diagnosis not present

## 2014-04-26 NOTE — Progress Notes (Signed)
[name], male   DOB: [birth year], 54 y.o.   MRN: [id number]    Facility  [facility]       Place of Service:   OFFICE    Allergies  Allergen Reactions  . Morphine And Related     hallucinations  . Tygacil [Tigecycline] Nausea And Vomiting, Nausea Only and Other (See Comments)    Chief Complaint  Patient presents with  . Medical Management of Chronic Issues    Needs referral    HPI:  54 yo male seen today for f/u. He completed HH PT s/p left knee replacement and now needs referral for o/p PT. Surgery went without complication and no postop issues. Currently not taking anticoagulation. He is an HD pt and receives heparin while at dialysis. He had monthly labs 2 days ago. A1c 5.9%. No low BS reactions. He has neuropathy in b/l hand and foot.   He is experiencing pain in left knee after stepping backwards incorrectly yesterday.  He saw Ortho today and was told he had twisted his knee. imaging studies showed TKR intact and no acute changes.  Past Medical History  Diagnosis Date  . Polymyalgia rheumatica   . Hypertension   . Hyperlipidemia   . Diabetic neuropathy   . Osteomyelitis of foot, left, acute   . Anxiety   . Insomnia, unspecified   . Unspecified vitamin D deficiency   . Anemia, unspecified   . Other chronic postoperative pain   . Unspecified hereditary and idiopathic peripheral neuropathy   . Allergy   . Unspecified osteomyelitis, site unspecified   . Long term (current) use of anticoagulants   . 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 Ulcer     diabetic foot   . GERD (gastroesophageal reflux disease) 01/27/2009  . Diabetes mellitus 01/27/2009  . Arthralgia 2010    polyarticular  . Hemorrhoids, internal 10/2011    small  . Myocardial infarction     mild 1995  . Stroke 01/10/06    no residual at this ti,e  . CHF (congestive heart failure)   . CHF (congestive heart failure) 07/25/2009    denies  . Pneumonia     hx  . Chronic kidney  disease     Followed by Dr Justin Mend tues,thurs,sat  . Hemodialysis access site with mature fistula   . Sleep apnea     hx of, had surgery   Past Surgical History  Procedure Laterality Date  . Amputation  01/21/2012    Procedure: AMPUTATION RAY;  Surgeon: [name], MD;  Location: [place];  Service: Orthopedics;  Laterality: Left;  Left Foot 4th Ray Amputation  . Cervical discectomy  02/2011    Fusion  . Arthroscopic knee  surgery Left 08-25-2012  . Amputation right foot digits 1-4    . Eye surgery Bilateral     Lazer  . Bascilic vein transposition Left 10/19/2012    Procedure: BASCILIC VEIN TRANSPOSITION;  Surgeon: Serafina Mitchell, MD;  Location: [place];  Service: Vascular;  Laterality: Left;  . Tonsillectomy    . Amputation Left 05/04/2013    Procedure: AMPUTATION DIGIT;  Surgeon: [name], MD;  Location: [place];  Service: Orthopedics;  Laterality: Left;  Left Great Toe Amputation at [facility]  . Toe surgery Left April 2015    Big toe removed on left foot.  . Total knee arthroplasty Left 04/10/2014    Procedure: TOTAL KNEE ARTHROPLASTY;  Surgeon: [name], MD;  Location:  [facility];  Service: Orthopedics;  Laterality: Left;   History   Social History  . Marital Status: Married    Spouse Name: N/A  . Number of Children: N/A  . Years of Education: N/A   Social History Main Topics  . Smoking status: Current Some Day Smoker -- 0.25 packs/day for 15 years    Types: Cigarettes  . Smokeless tobacco: Never Used     Comment: occ cig  . Alcohol Use: No  . Drug Use: No  . Sexual Activity: Not on file   Other Topics Concern  . None   Social History Narrative     Medications: Patient's Medications  New Prescriptions   No medications on file  Previous Medications   AMITRIPTYLINE (ELAVIL) 50 MG TABLET    Take 1 tablet (50 mg total) by mouth at bedtime.   AMLODIPINE (NORVASC) 5 MG TABLET    Take 1 tablet by mouth daily.   CALCIUM ACETATE (PHOSLO) 667 MG CAPSULE    Take 667 mg by  mouth 3 (three) times daily with meals.   CHOLECALCIFEROL (VITAMIN D PO)    Take 1 tablet by mouth 3 (three) times a week. At dialysis   GABAPENTIN (NEURONTIN) 100 MG CAPSULE    Take 2 capsules (200 mg total) by mouth 2 (two) times daily.   GLIPIZIDE (GLUCOTROL) 5 MG TABLET    Take 1 tablet (5 mg total) by mouth daily before breakfast.   HYDROCODONE-ACETAMINOPHEN (NORCO/VICODIN) 5-325 MG PER TABLET       RANITIDINE (ZANTAC) 150 MG TABLET    Take 150 mg by mouth daily as needed.    SILDENAFIL (VIAGRA) 25 MG TABLET    Take 2 tablets (50 mg total) by mouth daily as needed for erectile dysfunction.   ZOLPIDEM (AMBIEN) 10 MG TABLET    Take 1 tablet (10 mg total) by mouth at bedtime as needed for sleep.  Modified Medications   No medications on file  Discontinued Medications   OXYCODONE-ACETAMINOPHEN (ROXICET) 5-325 MG PER TABLET    Take 1 tablet by mouth every 4 (four) hours as needed for severe pain.     Review of Systems  Respiratory: Negative for shortness of breath.   Cardiovascular: Positive for leg swelling. Negative for chest pain.  Musculoskeletal: Positive for joint swelling, arthralgias and gait problem.  Skin: Negative for rash and wound.  Neurological: Positive for weakness.  Psychiatric/Behavioral: Negative for sleep disturbance.    Filed Vitals:   04/26/14 1559  BP: 170/90  Pulse: 104  Temp: 98.5 F (36.9 C)  TempSrc: Oral  Resp: 20  Height: '5\' 10"'  (1.778 m)  Weight: 218 lb 6.4 oz (99.066 kg)  SpO2: 92%   Body mass index is 31.34 kg/(m^2).  Physical Exam  Constitutional: He is oriented to person, place, and time. He appears well-developed and well-nourished. No distress.  Cardiovascular:  L>R +1 pitting LE edema. No calf TTP. Distal pulses palpable  Musculoskeletal: He exhibits edema and tenderness.  Reduced left knee extension and flexion with significant amt of swelling. Antalgic gait. Using cane to ambulate. Increased warmth touch. No effusion  Neurological: He  is alert and oriented to person, place, and time.  Skin: Skin is warm and dry. No rash noted.  Psychiatric: He has a normal mood and affect. His behavior is normal. Judgment and thought content normal.     Labs reviewed: Admission on 04/10/2014, Discharged on 04/13/2014  Component Date Value Ref Range Status  . Sodium 04/10/2014 140  135 -  145 mmol/L Final  . Potassium 04/10/2014 4.3  3.5 - 5.1 mmol/L Final  . Glucose, Bld 04/10/2014 114* 70 - 99 mg/dL Final  . HCT 04/10/2014 36.0* 39.0 - 52.0 % Final  . Hemoglobin 04/10/2014 12.2* 13.0 - 17.0 g/dL Final  . Glucose-Capillary 04/10/2014 94  70 - 99 mg/dL Final  . Comment 1 04/10/2014 Notify RN   Final  . Comment 2 04/10/2014 Document in Chart   Final  . WBC 04/10/2014 9.7  4.0 - 10.5 K/uL Final  . RBC 04/10/2014 3.56* 4.22 - 5.81 MIL/uL Final  . Hemoglobin 04/10/2014 10.3* 13.0 - 17.0 g/dL Final  . HCT 04/10/2014 31.9* 39.0 - 52.0 % Final  . MCV 04/10/2014 89.6  78.0 - 100.0 fL Final  . MCH 04/10/2014 28.9  26.0 - 34.0 pg Final  . MCHC 04/10/2014 32.3  30.0 - 36.0 g/dL Final  . RDW 04/10/2014 13.8  11.5 - 15.5 % Final  . Platelets 04/10/2014 182  150 - 400 K/uL Final  . Sodium 04/10/2014 139  135 - 145 mmol/L Final  . Potassium 04/10/2014 4.3  3.5 - 5.1 mmol/L Final  . Chloride 04/10/2014 101  96 - 112 mmol/L Final  . CO2 04/10/2014 25  19 - 32 mmol/L Final  . Glucose, Bld 04/10/2014 147* 70 - 99 mg/dL Final  . BUN 04/10/2014 32* 6 - 23 mg/dL Final  . Creatinine, Ser 04/10/2014 5.40* 0.50 - 1.35 mg/dL Final  . Calcium 04/10/2014 8.8  8.4 - 10.5 mg/dL Final  . GFR calc non Af Amer 04/10/2014 11* >90 mL/min Final  . GFR calc Af Amer 04/10/2014 12* >90 mL/min Final   Comment: (NOTE) The eGFR has been calculated using the CKD EPI equation. This calculation has not been validated in all clinical situations. eGFR's persistently <90 mL/min signify possible Chronic Kidney Disease.   . Anion gap 04/10/2014 13  5 - 15 Final  .  Glucose-Capillary 04/10/2014 190* 70 - 99 mg/dL Final  . Comment 1 04/10/2014 Notify RN   Final  . Glucose-Capillary 04/11/2014 128* 70 - 99 mg/dL Final  . Comment 1 04/11/2014 Notify RN   Final  . Glucose-Capillary 04/11/2014 109* 70 - 99 mg/dL Final  . Comment 1 04/11/2014 Notify RN   Final  . Glucose-Capillary 04/11/2014 88  70 - 99 mg/dL Final  . Glucose-Capillary 04/11/2014 123* 70 - 99 mg/dL Final  . Glucose-Capillary 04/12/2014 103* 70 - 99 mg/dL Final  . WBC 04/12/2014 8.6  4.0 - 10.5 K/uL Final  . RBC 04/12/2014 2.89* 4.22 - 5.81 MIL/uL Final  . Hemoglobin 04/12/2014 8.6* 13.0 - 17.0 g/dL Final  . HCT 04/12/2014 26.1* 39.0 - 52.0 % Final  . MCV 04/12/2014 90.3  78.0 - 100.0 fL Final  . MCH 04/12/2014 29.8  26.0 - 34.0 pg Final  . MCHC 04/12/2014 33.0  30.0 - 36.0 g/dL Final  . RDW 04/12/2014 13.9  11.5 - 15.5 % Final  . Platelets 04/12/2014 149* 150 - 400 K/uL Final  . Sodium 04/12/2014 135  135 - 145 mmol/L Final  . Potassium 04/12/2014 4.2  3.5 - 5.1 mmol/L Final  . Chloride 04/12/2014 101  96 - 112 mmol/L Final  . CO2 04/12/2014 27  19 - 32 mmol/L Final  . Glucose, Bld 04/12/2014 111* 70 - 99 mg/dL Final  . BUN 04/12/2014 35* 6 - 23 mg/dL Final  . Creatinine, Ser 04/12/2014 6.37* 0.50 - 1.35 mg/dL Final  . Calcium 04/12/2014 8.1* 8.4 - 10.5 mg/dL  Final  . Phosphorus 04/12/2014 6.8* 2.3 - 4.6 mg/dL Final  . Albumin 04/12/2014 2.5* 3.5 - 5.2 g/dL Final  . GFR calc non Af Amer 04/12/2014 9* >90 mL/min Final  . GFR calc Af Amer 04/12/2014 10* >90 mL/min Final   Comment: (NOTE) The eGFR has been calculated using the CKD EPI equation. This calculation has not been validated in all clinical situations. eGFR's persistently <90 mL/min signify possible Chronic Kidney Disease.   . Anion gap 04/12/2014 7  5 - 15 Final  . Glucose-Capillary 04/12/2014 146* 70 - 99 mg/dL Final  . Glucose-Capillary 04/12/2014 150* 70 - 99 mg/dL Final  . Glucose-Capillary 04/13/2014 108* 70 - 99  mg/dL Final  . Glucose-Capillary 04/13/2014 104* 70 - 99 mg/dL Final  Hospital Outpatient Visit on 03/29/2014  Component Date Value Ref Range Status  . aPTT 03/29/2014 31  24 - 37 seconds Final  . WBC 03/29/2014 7.8  4.0 - 10.5 K/uL Final  . RBC 03/29/2014 4.23  4.22 - 5.81 MIL/uL Final  . Hemoglobin 03/29/2014 12.5* 13.0 - 17.0 g/dL Final  . HCT 03/29/2014 38.2* 39.0 - 52.0 % Final  . MCV 03/29/2014 90.3  78.0 - 100.0 fL Final  . MCH 03/29/2014 29.6  26.0 - 34.0 pg Final  . MCHC 03/29/2014 32.7  30.0 - 36.0 g/dL Final  . RDW 03/29/2014 13.7  11.5 - 15.5 % Final  . Platelets 03/29/2014 243  150 - 400 K/uL Final  . Sodium 03/29/2014 139  135 - 145 mmol/L Final  . Potassium 03/29/2014 3.8  3.5 - 5.1 mmol/L Final  . Chloride 03/29/2014 101  96 - 112 mmol/L Final  . CO2 03/29/2014 28  19 - 32 mmol/L Final  . Glucose, Bld 03/29/2014 111* 70 - 99 mg/dL Final  . BUN 03/29/2014 24* 6 - 23 mg/dL Final  . Creatinine, Ser 03/29/2014 4.88* 0.50 - 1.35 mg/dL Final  . Calcium 03/29/2014 9.6  8.4 - 10.5 mg/dL Final  . Total Protein 03/29/2014 8.3  6.0 - 8.3 g/dL Final  . Albumin 03/29/2014 3.6  3.5 - 5.2 g/dL Final  . AST 03/29/2014 22  0 - 37 U/L Final  . ALT 03/29/2014 17  0 - 53 U/L Final  . Alkaline Phosphatase 03/29/2014 78  39 - 117 U/L Final  . Total Bilirubin 03/29/2014 0.5  0.3 - 1.2 mg/dL Final  . GFR calc non Af Amer 03/29/2014 12* >90 mL/min Final  . GFR calc Af Amer 03/29/2014 14* >90 mL/min Final   Comment: (NOTE) The eGFR has been calculated using the CKD EPI equation. This calculation has not been validated in all clinical situations. eGFR's persistently <90 mL/min signify possible Chronic Kidney Disease.   . Anion gap 03/29/2014 10  5 - 15 Final  . Prothrombin Time 03/29/2014 13.5  11.6 - 15.2 seconds Final  . INR 03/29/2014 1.01  0.00 - 1.49 Final  . MRSA, PCR 03/29/2014 NEGATIVE  NEGATIVE Final  . Staphylococcus aureus 03/29/2014 NEGATIVE  NEGATIVE Final   Comment:          The Xpert SA Assay (FDA approved for NASAL specimens in patients over 55 years of age), is one component of a comprehensive surveillance program.  Test performance has been validated by [facility] for patients greater than or equal to 15 year old. It is not intended to diagnose infection nor to guide or monitor treatment.      Assessment/Plan   ICD-9-CM ICD-10-CM   1. 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 Diabetic autonomic neuropathy associated with type 2 diabetes mellitus 250.60 E11.43    337.1    3. Essential hypertension, benign - with elevated BP today due to knee pain 401.1 I10   4. Left knee sprain, initial encounter 844.9 S83.92XA    --Keep left leg elevated when seated  --Apply ice compresses as needed while awake  --Take pain med as needed  --Follow up with Ortho as scheduled  --RTO in 2 mos for re-eval  Eiman Maret S. Perlie Gold  [facility] [address] [place], [place] [address] [phone] Office (Wednesdays and Fridays 8 AM - 5 PM) [phone] Cell (Monday-Friday 8 AM - 5 PM)

## 2014-04-26 NOTE — Patient Instructions (Signed)
Keep left leg elevated when seated  Apply ice compresses as needed while awake  Take pain med as needed  Follow up with Ortho as scheduled  RTO in 2 mos for re-eval

## 2014-04-29 ENCOUNTER — Encounter (HOSPITAL_COMMUNITY): Payer: Self-pay | Admitting: Orthopedic Surgery

## 2014-05-09 ENCOUNTER — Observation Stay (HOSPITAL_COMMUNITY): Payer: Commercial Managed Care - HMO | Admitting: Anesthesiology

## 2014-05-09 ENCOUNTER — Encounter (HOSPITAL_COMMUNITY): Payer: Self-pay | Admitting: *Deleted

## 2014-05-09 ENCOUNTER — Ambulatory Visit (HOSPITAL_COMMUNITY)
Admission: EM | Admit: 2014-05-09 | Discharge: 2014-05-10 | Disposition: A | Discharge status: Home / Self Care | Payer: Commercial Managed Care - HMO | Attending: Orthopedic Surgery | Admitting: Orthopedic Surgery

## 2014-05-09 ENCOUNTER — Emergency Department (HOSPITAL_COMMUNITY): Payer: Commercial Managed Care - HMO

## 2014-05-09 ENCOUNTER — Other Ambulatory Visit (HOSPITAL_COMMUNITY): Payer: Self-pay | Admitting: Orthopedic Surgery

## 2014-05-09 ENCOUNTER — Encounter (HOSPITAL_COMMUNITY)
Admission: EM | Disposition: A | Discharge status: Home / Self Care | Payer: Self-pay | Source: Home / Self Care | Attending: Emergency Medicine

## 2014-05-09 DIAGNOSIS — Z885 Allergy status to narcotic agent status: Secondary | ICD-10-CM | POA: Insufficient documentation

## 2014-05-09 DIAGNOSIS — N189 Chronic kidney disease, unspecified: Secondary | ICD-10-CM | POA: Insufficient documentation

## 2014-05-09 DIAGNOSIS — M86172 Other acute osteomyelitis, left ankle and foot: Secondary | ICD-10-CM | POA: Insufficient documentation

## 2014-05-09 DIAGNOSIS — Z96652 Presence of left artificial knee joint: Secondary | ICD-10-CM | POA: Insufficient documentation

## 2014-05-09 DIAGNOSIS — D649 Anemia, unspecified: Secondary | ICD-10-CM | POA: Diagnosis not present

## 2014-05-09 DIAGNOSIS — Z992 Dependence on renal dialysis: Secondary | ICD-10-CM | POA: Diagnosis not present

## 2014-05-09 DIAGNOSIS — I252 Old myocardial infarction: Secondary | ICD-10-CM | POA: Insufficient documentation

## 2014-05-09 DIAGNOSIS — I129 Hypertensive chronic kidney disease with stage 1 through stage 4 chronic kidney disease, or unspecified chronic kidney disease: Secondary | ICD-10-CM | POA: Insufficient documentation

## 2014-05-09 DIAGNOSIS — E559 Vitamin D deficiency, unspecified: Secondary | ICD-10-CM | POA: Insufficient documentation

## 2014-05-09 DIAGNOSIS — F17210 Nicotine dependence, cigarettes, uncomplicated: Secondary | ICD-10-CM | POA: Diagnosis not present

## 2014-05-09 DIAGNOSIS — E785 Hyperlipidemia, unspecified: Secondary | ICD-10-CM | POA: Insufficient documentation

## 2014-05-09 DIAGNOSIS — Z8673 Personal history of transient ischemic attack (TIA), and cerebral infarction without residual deficits: Secondary | ICD-10-CM | POA: Diagnosis not present

## 2014-05-09 DIAGNOSIS — I509 Heart failure, unspecified: Secondary | ICD-10-CM | POA: Diagnosis not present

## 2014-05-09 DIAGNOSIS — Z79899 Other long term (current) drug therapy: Secondary | ICD-10-CM | POA: Insufficient documentation

## 2014-05-09 DIAGNOSIS — Y838 Other surgical procedures as the cause of abnormal reaction of the patient, or of later complication, without mention of misadventure at the time of the procedure: Secondary | ICD-10-CM | POA: Insufficient documentation

## 2014-05-09 DIAGNOSIS — Y92234 Operating room of hospital as the place of occurrence of the external cause: Secondary | ICD-10-CM | POA: Insufficient documentation

## 2014-05-09 DIAGNOSIS — T8131XA Disruption of external operation (surgical) wound, not elsewhere classified, initial encounter: Secondary | ICD-10-CM | POA: Diagnosis not present

## 2014-05-09 DIAGNOSIS — F419 Anxiety disorder, unspecified: Secondary | ICD-10-CM | POA: Diagnosis not present

## 2014-05-09 DIAGNOSIS — Z7901 Long term (current) use of anticoagulants: Secondary | ICD-10-CM | POA: Insufficient documentation

## 2014-05-09 DIAGNOSIS — E1140 Type 2 diabetes mellitus with diabetic neuropathy, unspecified: Secondary | ICD-10-CM | POA: Diagnosis not present

## 2014-05-09 DIAGNOSIS — M353 Polymyalgia rheumatica: Secondary | ICD-10-CM | POA: Insufficient documentation

## 2014-05-09 DIAGNOSIS — G4700 Insomnia, unspecified: Secondary | ICD-10-CM | POA: Diagnosis not present

## 2014-05-09 HISTORY — DX: Dependence on renal dialysis: Z99.2

## 2014-05-09 HISTORY — PX: WOUND DEBRIDEMENT: SHX247

## 2014-05-09 HISTORY — DX: Type 2 diabetes mellitus without complications: E11.9

## 2014-05-09 HISTORY — DX: Low back pain, unspecified: M54.50

## 2014-05-09 HISTORY — DX: Other chronic pain: G89.29

## 2014-05-09 HISTORY — DX: End stage renal disease: N18.6

## 2014-05-09 HISTORY — DX: Personal history of other medical treatment: Z92.89

## 2014-05-09 HISTORY — DX: Low back pain: M54.5

## 2014-05-09 HISTORY — DX: Unspecified osteoarthritis, unspecified site: M19.90

## 2014-05-09 HISTORY — PX: I & D EXTREMITY: SHX5045

## 2014-05-09 LAB — CBC WITH DIFFERENTIAL/PLATELET
Basophils Absolute: 0.1 10*3/uL (ref 0.0–0.1)
Basophils Relative: 1 % (ref 0–1)
Eosinophils Absolute: 0.2 10*3/uL (ref 0.0–0.7)
Eosinophils Relative: 3 % (ref 0–5)
HEMATOCRIT: 34.1 % — AB (ref 39.0–52.0)
HEMOGLOBIN: 10.6 g/dL — AB (ref 13.0–17.0)
LYMPHS PCT: 31 % (ref 12–46)
Lymphs Abs: 2.2 10*3/uL (ref 0.7–4.0)
MCH: 28 pg (ref 26.0–34.0)
MCHC: 31.1 g/dL (ref 30.0–36.0)
MCV: 90.2 fL (ref 78.0–100.0)
MONO ABS: 1 10*3/uL (ref 0.1–1.0)
MONOS PCT: 14 % — AB (ref 3–12)
Neutro Abs: 3.6 10*3/uL (ref 1.7–7.7)
Neutrophils Relative %: 51 % (ref 43–77)
Platelets: 248 10*3/uL (ref 150–400)
RBC: 3.78 MIL/uL — ABNORMAL LOW (ref 4.22–5.81)
RDW: 14.9 % (ref 11.5–15.5)
WBC: 6.9 10*3/uL (ref 4.0–10.5)

## 2014-05-09 LAB — GLUCOSE, CAPILLARY: Glucose-Capillary: 104 mg/dL — ABNORMAL HIGH (ref 70–99)

## 2014-05-09 LAB — I-STAT CHEM 8, ED
BUN: 11 mg/dL (ref 6–23)
CHLORIDE: 100 mmol/L (ref 96–112)
Calcium, Ion: 1.13 mmol/L (ref 1.12–1.23)
Creatinine, Ser: 3.1 mg/dL — ABNORMAL HIGH (ref 0.50–1.35)
Glucose, Bld: 121 mg/dL — ABNORMAL HIGH (ref 70–99)
HCT: 33 % — ABNORMAL LOW (ref 39.0–52.0)
Hemoglobin: 11.2 g/dL — ABNORMAL LOW (ref 13.0–17.0)
Potassium: 4.8 mmol/L (ref 3.5–5.1)
Sodium: 137 mmol/L (ref 135–145)
TCO2: 24 mmol/L (ref 0–100)

## 2014-05-09 SURGERY — IRRIGATION AND DEBRIDEMENT EXTREMITY
Anesthesia: General | Site: Knee | Laterality: Left

## 2014-05-09 MED ORDER — PROMETHAZINE HCL 25 MG/ML IJ SOLN: 6.2500 mg | INTRAMUSCULAR | Status: DC | PRN

## 2014-05-09 MED ORDER — OXYCODONE HCL 5 MG PO TABS
5.0000 mg | ORAL_TABLET | ORAL | Status: DC | PRN
  Administered 2014-05-10 (×4): 10 mg via ORAL
  Filled 2014-05-09 (×4): qty 2

## 2014-05-09 MED ORDER — FENTANYL CITRATE (PF) 100 MCG/2ML IJ SOLN
50.0000 ug | Freq: Once | INTRAMUSCULAR | Status: AC
  Administered 2014-05-09: 50 ug via INTRAVENOUS
  Filled 2014-05-09: qty 2

## 2014-05-09 MED ORDER — GLIPIZIDE 5 MG PO TABS
5.0000 mg | ORAL_TABLET | Freq: Every day | ORAL | Status: DC
  Administered 2014-05-10: 5 mg via ORAL
  Filled 2014-05-09: qty 1

## 2014-05-09 MED ORDER — ZOLPIDEM TARTRATE 5 MG PO TABS
10.0000 mg | ORAL_TABLET | Freq: Every evening | ORAL | Status: DC | PRN
  Administered 2014-05-10: 10 mg via ORAL
  Filled 2014-05-09: qty 2

## 2014-05-09 MED ORDER — HYDROMORPHONE HCL 1 MG/ML IJ SOLN: 1.0000 mg | INTRAMUSCULAR | Status: DC | PRN

## 2014-05-09 MED ORDER — INSULIN ASPART 100 UNIT/ML ~~LOC~~ SOLN
0.0000 [IU] | Freq: Three times a day (TID) | SUBCUTANEOUS | Status: DC
  Administered 2014-05-10: 1 [IU] via SUBCUTANEOUS

## 2014-05-09 MED ORDER — CEFAZOLIN SODIUM 1-5 GM-% IV SOLN
1.0000 g | Freq: Four times a day (QID) | INTRAVENOUS | Status: AC
  Administered 2014-05-09 – 2014-05-10 (×3): 1 g via INTRAVENOUS
  Filled 2014-05-09 (×3): qty 50

## 2014-05-09 MED ORDER — AMLODIPINE BESYLATE 5 MG PO TABS
5.0000 mg | ORAL_TABLET | Freq: Every day | ORAL | Status: DC
  Administered 2014-05-10: 5 mg via ORAL
  Filled 2014-05-09: qty 1

## 2014-05-09 MED ORDER — ACETAMINOPHEN 650 MG RE SUPP: 650.0000 mg | Freq: Four times a day (QID) | RECTAL | Status: DC | PRN

## 2014-05-09 MED ORDER — OXYCODONE HCL 5 MG/5ML PO SOLN: 5.0000 mg | Freq: Once | ORAL | Status: AC | PRN

## 2014-05-09 MED ORDER — LIDOCAINE HCL (CARDIAC) 20 MG/ML IV SOLN
INTRAVENOUS | Status: DC | PRN
  Administered 2014-05-09: 50 mg via INTRAVENOUS

## 2014-05-09 MED ORDER — METHOCARBAMOL 500 MG PO TABS
500.0000 mg | ORAL_TABLET | Freq: Four times a day (QID) | ORAL | Status: DC | PRN
  Administered 2014-05-09 – 2014-05-10 (×3): 500 mg via ORAL
  Filled 2014-05-09 (×4): qty 1

## 2014-05-09 MED ORDER — METHOCARBAMOL 500 MG PO TABS
ORAL_TABLET | ORAL | Status: AC
  Filled 2014-05-09: qty 1

## 2014-05-09 MED ORDER — MIDAZOLAM HCL 5 MG/5ML IJ SOLN
INTRAMUSCULAR | Status: DC | PRN
  Administered 2014-05-09 (×2): 1 mg via INTRAVENOUS

## 2014-05-09 MED ORDER — ONDANSETRON HCL 4 MG PO TABS: 4.0000 mg | ORAL_TABLET | Freq: Four times a day (QID) | ORAL | Status: DC | PRN

## 2014-05-09 MED ORDER — CEFAZOLIN SODIUM-DEXTROSE 2-3 GM-% IV SOLR
INTRAVENOUS | Status: DC | PRN
  Administered 2014-05-09: 2 g via INTRAVENOUS

## 2014-05-09 MED ORDER — PHENYLEPHRINE HCL 10 MG/ML IJ SOLN
INTRAMUSCULAR | Status: DC | PRN
  Administered 2014-05-09 (×3): 40 ug via INTRAVENOUS

## 2014-05-09 MED ORDER — HYDROMORPHONE HCL 1 MG/ML IJ SOLN
INTRAMUSCULAR | Status: AC
  Filled 2014-05-09: qty 1

## 2014-05-09 MED ORDER — HYDROMORPHONE HCL 1 MG/ML IJ SOLN
0.2500 mg | INTRAMUSCULAR | Status: DC | PRN
  Administered 2014-05-09 (×3): 0.5 mg via INTRAVENOUS
  Administered 2014-05-09: 0.25 mg via INTRAVENOUS
  Administered 2014-05-09: 0.5 mg via INTRAVENOUS

## 2014-05-09 MED ORDER — PROPOFOL 10 MG/ML IV BOLUS
INTRAVENOUS | Status: DC | PRN
  Administered 2014-05-09: 50 mg via INTRAVENOUS
  Administered 2014-05-09: 150 mg via INTRAVENOUS

## 2014-05-09 MED ORDER — EPHEDRINE SULFATE 50 MG/ML IJ SOLN
INTRAMUSCULAR | Status: DC | PRN
  Administered 2014-05-09: 5 mg via INTRAVENOUS

## 2014-05-09 MED ORDER — ENSURE ENLIVE PO LIQD
237.0000 mL | Freq: Two times a day (BID) | ORAL | Status: DC
  Administered 2014-05-10: 237 mL via ORAL

## 2014-05-09 MED ORDER — SODIUM CHLORIDE 0.9 % IV SOLN
INTRAVENOUS | Status: DC
  Administered 2014-05-09: 23:00:00 via INTRAVENOUS

## 2014-05-09 MED ORDER — ONDANSETRON HCL 4 MG/2ML IJ SOLN: 4.0000 mg | Freq: Four times a day (QID) | INTRAMUSCULAR | Status: DC | PRN

## 2014-05-09 MED ORDER — CALCIUM ACETATE (PHOS BINDER) 667 MG PO CAPS
667.0000 mg | ORAL_CAPSULE | Freq: Three times a day (TID) | ORAL | Status: DC
Start: 2014-05-10 — End: 2014-05-10
  Administered 2014-05-10 (×2): 667 mg via ORAL
  Filled 2014-05-09 (×5): qty 1

## 2014-05-09 MED ORDER — FAMOTIDINE 20 MG PO TABS
10.0000 mg | ORAL_TABLET | Freq: Every day | ORAL | Status: DC
  Administered 2014-05-10: 10 mg via ORAL
  Filled 2014-05-09: qty 1

## 2014-05-09 MED ORDER — ONDANSETRON HCL 4 MG/2ML IJ SOLN
INTRAMUSCULAR | Status: DC | PRN
  Administered 2014-05-09: 4 mg via INTRAVENOUS

## 2014-05-09 MED ORDER — METOCLOPRAMIDE HCL 5 MG PO TABS: 5.0000 mg | ORAL_TABLET | Freq: Three times a day (TID) | ORAL | Status: DC | PRN

## 2014-05-09 MED ORDER — 0.9 % SODIUM CHLORIDE (POUR BTL) OPTIME
TOPICAL | Status: DC | PRN
  Administered 2014-05-09: 1000 mL

## 2014-05-09 MED ORDER — METOCLOPRAMIDE HCL 5 MG/ML IJ SOLN: 5.0000 mg | Freq: Three times a day (TID) | INTRAMUSCULAR | Status: DC | PRN

## 2014-05-09 MED ORDER — FENTANYL CITRATE (PF) 100 MCG/2ML IJ SOLN
INTRAMUSCULAR | Status: DC | PRN
Start: 2014-05-09 — End: 2014-05-09
  Administered 2014-05-09 (×5): 50 ug via INTRAVENOUS

## 2014-05-09 MED ORDER — SODIUM CHLORIDE 0.9 % IR SOLN
Status: DC | PRN
  Administered 2014-05-09: 3000 mL

## 2014-05-09 MED ORDER — SODIUM CHLORIDE 0.9 % IV SOLN
INTRAVENOUS | Status: DC | PRN
  Administered 2014-05-09: 20:00:00 via INTRAVENOUS

## 2014-05-09 MED ORDER — GENTAMICIN SULFATE 40 MG/ML IJ SOLN
INTRAMUSCULAR | Status: AC
  Filled 2014-05-09: qty 4

## 2014-05-09 MED ORDER — OXYCODONE HCL 5 MG PO TABS
ORAL_TABLET | ORAL | Status: AC
  Filled 2014-05-09: qty 1

## 2014-05-09 MED ORDER — ACETAMINOPHEN 325 MG PO TABS: 650.0000 mg | ORAL_TABLET | Freq: Four times a day (QID) | ORAL | Status: DC | PRN

## 2014-05-09 MED ORDER — METHOCARBAMOL 1000 MG/10ML IJ SOLN
500.0000 mg | Freq: Four times a day (QID) | INTRAVENOUS | Status: DC | PRN
  Filled 2014-05-09: qty 5

## 2014-05-09 MED ORDER — AMITRIPTYLINE HCL 50 MG PO TABS
50.0000 mg | ORAL_TABLET | Freq: Every day | ORAL | Status: DC
  Administered 2014-05-09: 50 mg via ORAL
  Filled 2014-05-09: qty 1

## 2014-05-09 MED ORDER — ASPIRIN EC 325 MG PO TBEC
325.0000 mg | DELAYED_RELEASE_TABLET | Freq: Every day | ORAL | Status: DC
  Administered 2014-05-10: 325 mg via ORAL
  Filled 2014-05-09: qty 1

## 2014-05-09 MED ORDER — DIPHENHYDRAMINE HCL 25 MG PO CAPS
25.0000 mg | ORAL_CAPSULE | Freq: Four times a day (QID) | ORAL | Status: DC | PRN
  Administered 2014-05-09: 25 mg via ORAL
  Filled 2014-05-09: qty 1

## 2014-05-09 MED ORDER — VANCOMYCIN HCL 500 MG IV SOLR
INTRAVENOUS | Status: DC | PRN
  Administered 2014-05-09: 500 mg

## 2014-05-09 MED ORDER — OXYCODONE HCL 5 MG PO TABS
5.0000 mg | ORAL_TABLET | Freq: Once | ORAL | Status: AC | PRN
  Administered 2014-05-09: 5 mg via ORAL

## 2014-05-09 MED ORDER — HYDROMORPHONE HCL 1 MG/ML IJ SOLN
INTRAMUSCULAR | Status: AC
  Filled 2014-05-09: qty 2

## 2014-05-09 MED ORDER — HYDROMORPHONE HCL 1 MG/ML IJ SOLN
0.5000 mg | Freq: Once | INTRAMUSCULAR | Status: AC
  Administered 2014-05-09: 0.5 mg via INTRAVENOUS

## 2014-05-09 MED ORDER — VANCOMYCIN HCL 500 MG IV SOLR
INTRAVENOUS | Status: AC
  Filled 2014-05-09: qty 500

## 2014-05-09 MED ORDER — GENTAMICIN SULFATE 40 MG/ML IJ SOLN
INTRAMUSCULAR | Status: DC | PRN
  Administered 2014-05-09: 160 mg

## 2014-05-09 MED ORDER — GABAPENTIN 100 MG PO CAPS
200.0000 mg | ORAL_CAPSULE | Freq: Two times a day (BID) | ORAL | Status: DC
  Administered 2014-05-09 – 2014-05-10 (×2): 200 mg via ORAL
  Filled 2014-05-09 (×2): qty 2

## 2014-05-09 SURGICAL SUPPLY — 45 items
BLADE SURG 10 STRL SS (BLADE) IMPLANT
BLADE SURG 21 STRL SS (BLADE) ×3 IMPLANT
BNDG COHESIVE 4X5 TAN STRL (GAUZE/BANDAGES/DRESSINGS) IMPLANT
BNDG COHESIVE 6X5 TAN STRL LF (GAUZE/BANDAGES/DRESSINGS) ×3 IMPLANT
BNDG GAUZE ELAST 4 BULKY (GAUZE/BANDAGES/DRESSINGS) ×3 IMPLANT
COVER SURGICAL LIGHT HANDLE (MISCELLANEOUS) ×6 IMPLANT
DRAPE U-SHAPE 47X51 STRL (DRAPES) ×3 IMPLANT
DRSG ADAPTIC 3X8 NADH LF (GAUZE/BANDAGES/DRESSINGS) ×3 IMPLANT
DURAPREP 26ML APPLICATOR (WOUND CARE) ×3 IMPLANT
ELECT CAUTERY BLADE 6.4 (BLADE) IMPLANT
ELECT REM PT RETURN 9FT ADLT (ELECTROSURGICAL) ×3
ELECTRODE REM PT RTRN 9FT ADLT (ELECTROSURGICAL) ×1 IMPLANT
GAUZE SPONGE 4X4 12PLY STRL (GAUZE/BANDAGES/DRESSINGS) ×3 IMPLANT
GLOVE BIOGEL PI IND STRL 9 (GLOVE) ×1 IMPLANT
GLOVE BIOGEL PI INDICATOR 9 (GLOVE) ×2
GLOVE BIOGEL PI ORTHO PRO SZ7 (GLOVE) ×4
GLOVE PI ORTHO PRO STRL SZ7 (GLOVE) ×2 IMPLANT
GLOVE SURG ORTHO 9.0 STRL STRW (GLOVE) ×3 IMPLANT
GLOVE SURG SS PI 6.5 STRL IVOR (GLOVE) ×3 IMPLANT
GOWN STRL REUS W/ TWL LRG LVL3 (GOWN DISPOSABLE) ×1 IMPLANT
GOWN STRL REUS W/ TWL XL LVL3 (GOWN DISPOSABLE) ×2 IMPLANT
GOWN STRL REUS W/TWL LRG LVL3 (GOWN DISPOSABLE) ×2
GOWN STRL REUS W/TWL XL LVL3 (GOWN DISPOSABLE) ×4
HANDPIECE INTERPULSE COAX TIP (DISPOSABLE) ×2
KIT BASIN OR (CUSTOM PROCEDURE TRAY) ×3 IMPLANT
KIT ROOM TURNOVER OR (KITS) ×3 IMPLANT
KIT STIMULAN RAPID CURE 5CC (Orthopedic Implant) ×3 IMPLANT
MANIFOLD NEPTUNE II (INSTRUMENTS) ×3 IMPLANT
NS IRRIG 1000ML POUR BTL (IV SOLUTION) ×3 IMPLANT
PACK ORTHO EXTREMITY (CUSTOM PROCEDURE TRAY) ×3 IMPLANT
PAD ABD 8X10 STRL (GAUZE/BANDAGES/DRESSINGS) ×3 IMPLANT
PAD ARMBOARD 7.5X6 YLW CONV (MISCELLANEOUS) ×6 IMPLANT
SET HNDPC FAN SPRY TIP SCT (DISPOSABLE) ×1 IMPLANT
SPONGE GAUZE 4X4 12PLY STER LF (GAUZE/BANDAGES/DRESSINGS) ×3 IMPLANT
STOCKINETTE IMPERVIOUS 9X36 MD (GAUZE/BANDAGES/DRESSINGS) IMPLANT
SUT ETHILON 2 0 PSLX (SUTURE) ×6 IMPLANT
SWAB COLLECTION DEVICE MRSA (MISCELLANEOUS) ×3 IMPLANT
TOWEL OR 17X24 6PK STRL BLUE (TOWEL DISPOSABLE) ×3 IMPLANT
TOWEL OR 17X26 10 PK STRL BLUE (TOWEL DISPOSABLE) ×3 IMPLANT
TUBE ANAEROBIC SPECIMEN COL (MISCELLANEOUS) IMPLANT
TUBE CONNECTING 12'X1/4 (SUCTIONS) ×1
TUBE CONNECTING 12X1/4 (SUCTIONS) ×2 IMPLANT
UNDERPAD 30X30 INCONTINENT (UNDERPADS AND DIAPERS) ×3 IMPLANT
WATER STERILE IRR 1000ML POUR (IV SOLUTION) ×3 IMPLANT
YANKAUER SUCT BULB TIP NO VENT (SUCTIONS) ×3 IMPLANT

## 2014-05-09 NOTE — ED Provider Notes (Signed)
CSN: [id number]     Arrival date & time 05/09/14  1546 History   First MD Initiated Contact with Patient 05/09/14 1551     Chief Complaint  Patient presents with  . Leg Injury    left knee     (Consider location/radiation/quality/duration/timing/severity/associated sxs/prior Treatment) HPI Comments: Pt with recent left knee replacement 4 weeks ago who was walking to the front door this afternoon and tripped over the rug falling onto the left knee causing his surgical incision to break open.  He was able to stand and only minimal bleeding.  No hx of anticoagulation except for heparin at dialysis. Tetanus UTD.    The history is provided by the patient.    Past Medical History  Diagnosis Date  . Polymyalgia rheumatica   . Hypertension   . Hyperlipidemia   . Diabetic neuropathy   . Osteomyelitis of foot, left, acute   . Anxiety   . Insomnia, unspecified   . Unspecified vitamin D deficiency   . Anemia, unspecified   . Other chronic postoperative pain   . Unspecified hereditary and idiopathic peripheral neuropathy   . Allergy   . Unspecified osteomyelitis, site unspecified   . Long term (current) use of anticoagulants   . 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 Ulcer     diabetic foot   . GERD (gastroesophageal reflux disease) 01/27/2009  . Diabetes mellitus 01/27/2009  . Arthralgia 2010    polyarticular  . Hemorrhoids, internal 10/2011    small  . Myocardial infarction     mild 1995  . Stroke 01/10/06    no residual at this ti,e  . CHF (congestive heart failure)   . CHF (congestive heart failure) 07/25/2009    denies  . Pneumonia     hx  . Chronic kidney disease     Followed by Dr Justin Mend tues,thurs,sat  . Hemodialysis access site with mature fistula   . Sleep apnea     hx of, had surgery   Past Surgical History  Procedure Laterality Date  . Amputation  01/21/2012    Procedure: AMPUTATION RAY;  Surgeon: [name], MD;  Location: [place];  Service:  Orthopedics;  Laterality: Left;  Left Foot 4th Ray Amputation  . Cervical discectomy  02/2011    Fusion  . Arthroscopic knee  surgery Left 08-25-2012  . Amputation right foot digits 1-4    . Eye surgery Bilateral     Lazer  . Bascilic vein transposition Left 10/19/2012    Procedure: BASCILIC VEIN TRANSPOSITION;  Surgeon: Serafina Mitchell, MD;  Location: [place] [facility];  Service: Vascular;  Laterality: Left;  . Tonsillectomy    . Amputation Left 05/04/2013    Procedure: AMPUTATION DIGIT;  Surgeon: [name], MD;  Location: [place];  Service: Orthopedics;  Laterality: Left;  Left Great Toe Amputation at [facility]  . Toe surgery Left April 2015    Big toe removed on left foot.  . Total knee arthroplasty Left 04/10/2014    Procedure: TOTAL KNEE ARTHROPLASTY;  Surgeon: [name], MD;  Location: [place];  Service: Orthopedics;  Laterality: Left;   Family History  Problem Relation Age of Onset  . Hypertension Mother   . Cancer Mother 54    Ovarian  . Heart disease Maternal Aunt   . Stroke Maternal Grandfather    History  Substance Use Topics  . Smoking status: Current Some Day Smoker -- 0.25 packs/day for 15 years  Types: Cigarettes  . Smokeless tobacco: Never Used     Comment: occ cig  . Alcohol Use: No    Review of Systems  Neurological: Negative for dizziness, syncope and weakness.  All other systems reviewed and are negative.     Allergies  Morphine and related and Tygacil  Home Medications   Prior to Admission medications   Medication Sig Start Date End Date Taking? Authorizing Provider  amitriptyline (ELAVIL) 50 MG tablet Take 1 tablet (50 mg total) by mouth at bedtime. 03/28/13   Blanchie Serve, MD  amLODipine (NORVASC) 5 MG tablet Take 1 tablet by mouth daily. 02/22/14   Historical Provider, MD  calcium acetate (PHOSLO) 667 MG capsule Take 667 mg by mouth 3 (three) times daily with meals.    Historical Provider, MD  Cholecalciferol (VITAMIN D PO) Take 1 tablet by mouth 3 (three)  times a week. At dialysis    Historical Provider, MD  gabapentin (NEURONTIN) 100 MG capsule Take 2 capsules (200 mg total) by mouth 2 (two) times daily. Patient taking differently: Take 200 mg by mouth daily.  09/28/13   Blanchie Serve, MD  glipiZIDE (GLUCOTROL) 5 MG tablet Take 1 tablet (5 mg total) by mouth daily before breakfast. 10/24/13   Blanchie Serve, MD  HYDROcodone-acetaminophen (NORCO/VICODIN) 5-325 MG per tablet  04/15/14   Historical Provider, MD  ranitidine (ZANTAC) 150 MG tablet Take 150 mg by mouth daily as needed.     Historical Provider, MD  sildenafil (VIAGRA) 25 MG tablet Take 2 tablets (50 mg total) by mouth daily as needed for erectile dysfunction. 06/27/13   Blanchie Serve, MD  zolpidem (AMBIEN) 10 MG tablet Take 1 tablet (10 mg total) by mouth at bedtime as needed for sleep. 12/28/13   Tiffany L Reed, DO   BP 154/83 mmHg  Pulse 95  Temp(Src) 98.1 F (36.7 C) (Oral)  Resp 18  SpO2 96% Physical Exam  Constitutional: He is oriented to person, place, and time. He appears well-developed and well-nourished. No distress.  HENT:  Head: Normocephalic and atraumatic.  Mouth/Throat: Oropharynx is clear and moist.  Eyes: Conjunctivae and EOM are normal. Pupils are equal, round, and reactive to light.  Neck: Normal range of motion. Neck supple.  Cardiovascular: Normal rate and intact distal pulses.   Pulmonary/Chest: Effort normal.  Musculoskeletal:       Legs: Neurological: He is alert and oriented to person, place, and time.  Skin: Skin is warm and dry. No rash noted. No erythema.  Psychiatric: He has a normal mood and affect. His behavior is normal.  Nursing note and vitals reviewed.   ED Course  Procedures (including critical care time) Labs Review Labs Reviewed  CBC WITH DIFFERENTIAL/PLATELET - Abnormal; Notable for the following:    RBC 3.78 (*)    Hemoglobin 10.6 (*)    HCT 34.1 (*)    Monocytes Relative 14 (*)    All other components within normal limits    GLUCOSE, CAPILLARY - Abnormal; Notable for the following:    Glucose-Capillary 104 (*)    All other components within normal limits  I-STAT CHEM 8, ED - Abnormal; Notable for the following:    Creatinine, Ser 3.10 (*)    Glucose, Bld 121 (*)    Hemoglobin 11.2 (*)    HCT 33.0 (*)    All other components within normal limits    Imaging Review Dg Knee Complete 4 Views Left  05/09/2014   CLINICAL DATA:  Pt states he tripped over  rug in his home earlier today and tore sutures from left knee replacement 4 weeks ago - now an open wound - pain concentrated anterior patella area  EXAM: LEFT KNEE - COMPLETE 4+ VIEW  COMPARISON:  None.  FINDINGS: Left knee prosthetic noted. There is prepatellar swelling and a moderate suprapatellar joint effusion. No evidence of fracture.  IMPRESSION: 1. No evidence fracture or dislocation. 2. Suprapatellar joint effusion. 3. Prepatellar soft tissue swelling.   Electronically Signed   By: Suzy Bouchard M.D.   On: 05/09/2014 16:58     EKG Interpretation None      MDM   Final diagnoses:  Surgical wound dehiscence, initial encounter    Patient presents today after he had a mechanical fall onto his left knee. He recently had a knee replacement 4 weeks ago and when he fell he dehisced his surgical wound. The entire incision is open now. He was able to stand on the leg without difficulty.  Patient is complaining of mild pain. He denies any loss of consciousness, weakness, dizziness or other complaints. Discussed patient with Dr. Sharol Given who feels that patient needs to be transferred to [facility] for surgical repair in a sterile environment.  Plain films here pending  Knee image without acute findings.  [facility]   Blanchie Dessert, MD 05/09/14 2259

## 2014-05-09 NOTE — Anesthesia Preprocedure Evaluation (Addendum)
Anesthesia Evaluation  Patient identified by MRN, date of birth, ID band Patient awake    Reviewed: Allergy & Precautions, H&P , NPO status , Patient's Chart, lab work & pertinent test results, reviewed documented beta blocker date and time   Airway Mallampati: II   Neck ROM: full    Dental  (+) Dental Advisory Given, Poor Dentition   Pulmonary neg pulmonary ROS, sleep apnea , pneumonia -, Current Smoker,  breath sounds clear to auscultation        Cardiovascular hypertension, + CAD, + Past MI, + Peripheral Vascular Disease and +CHF Rhythm:Regular     Neuro/Psych PSYCHIATRIC DISORDERS Anxiety Depression  Neuromuscular disease CVA, No Residual Symptoms    GI/Hepatic GERD-  Medicated and Controlled,  Endo/Other  diabetes, Type 2, Oral Hypoglycemic Agentsobese  Renal/GU ESRFRenal disease     Musculoskeletal  (+) Arthritis -,   Abdominal (+)  Abdomen: soft. Bowel sounds: normal.  Peds  Hematology  (+) anemia ,   Anesthesia Other Findings   Reproductive/Obstetrics                           Anesthesia Physical  Anesthesia Plan  ASA: III  Anesthesia Plan: General   Post-op Pain Management:    Induction:   Airway Management Planned: LMA and Oral ETT  Additional Equipment:   Intra-op Plan:   Post-operative Plan: Extubation in OR  Informed Consent: I have reviewed the patients History and Physical, chart, labs and discussed the procedure including the risks, benefits and alternatives for the proposed anesthesia with the patient or authorized representative who has indicated his/her understanding and acceptance.   Dental advisory given  Plan Discussed with: CRNA  Anesthesia Plan Comments:         Anesthesia Quick Evaluation

## 2014-05-09 NOTE — Progress Notes (Signed)
EDCM spoke to patient at bedside.  Patient lives at home with his wife and children.  Patient confirms he has home health services with [facility] for PT.  Patient confirms he has received 3 in 1 and rolling walker.  Patient reports he is able to perform his ADL's on his own.  Patient denies need for further home health services or equipment at this time.  No further EDCM needs at this time.

## 2014-05-09 NOTE — ED Notes (Signed)
PER PTAR: pt from [facility] ER. Reports he had left knee replacement on March 30th. He states he tripped over a rug today and fell and busted open the incision. PTAR reports his incisions are no longer intact. Dr. Sharol Given performed his knee surgery in March and is to see patient. Gauze wrapped around left knee currently.

## 2014-05-09 NOTE — ED Notes (Signed)
Patient transported to X-ray 

## 2014-05-09 NOTE — ED Notes (Signed)
Bed: WA08 Expected date:  Expected time:  Means of arrival:  Comments: Hold EMS knee lac

## 2014-05-09 NOTE — Op Note (Signed)
05/09/2014  9:05 PM  PATIENT:  Matthew Kline    PRE-OPERATIVE DIAGNOSIS:  Dehiscence Left Total Knee Arthroplasty Incision  POST-OPERATIVE DIAGNOSIS:  Same  PROCEDURE:  Excisional Debridement, skin soft tissue and fascia Left Knee Local tissue rearrangement and closure Closure of Total Knee Arthroplasty Incision 5 x 13 cm Placement of antibiotic beads  SURGEON:  [name], MD  PHYSICIAN ASSISTANT:None ANESTHESIA:   General  PREOPERATIVE INDICATIONS:  [name] is a  54 y.o. male with a diagnosis of Dehiscence Left Total Knee Arthroplasty Incision who failed conservative measures and elected for surgical management.    The risks benefits and alternatives were discussed with the patient preoperatively including but not limited to the risks of infection, bleeding, nerve injury, cardiopulmonary complications, the need for revision surgery, among others, and the patient was willing to proceed.  OPERATIVE IMPLANTS: Antibiotic beads with 500 mg vancomycin and 160 mg gentamicin  OPERATIVE FINDINGS: Joint capsule was not violated and extensor tendon and patella tendon were intact  OPERATIVE PROCEDURE: Patient was brought to the operating room and underwent a general anesthetic. After adequate levels anesthesia obtained patient's left lower extremity was prepped using DuraPrep draped into a sterile field. A timeout was called. An incision was made to ellipse out the dehisced wound approximately 5 mm was taken circumferentially around the wound leaving the wound 5 x 13 cm. The wound was irrigated with pulsatile lavage 3 L. The sore suture within the wound was removed. After debridement excision of skin soft tissue and fascial tissue there is good healthy tissue no foreign bodies. There is no draining of synovial fluid. The local tissue was then rearranged and closed with 2-0 nylon after the wound was packed with antibiotic beads. A sterile compressive dressing was applied. Patient  was extubated taken to the PACU in stable condition.

## 2014-05-09 NOTE — ED Notes (Signed)
Per EMS they were contacted by patient d/t fall today,pt fell onto LLE, left knee replacement 3 weeks ago. Incision area is now open. Pt c/o pain LLE specifically knee 8/10.

## 2014-05-09 NOTE — H&P (Signed)
Matthew Kline is an 54 y.o. male.   Chief Complaint: Traumatic dehiscence left total knee arthroplasty HPI: Patient is a 54 year old gentleman who is about 4 weeks status post left total knee arthroplasty. Patient states for the last several days he is been completely asymptomatic has had no pain in his knee states its best is felt in the past 6 years. Patient does states that he fell today sustaining the traumatic rupture of his surgical total knee incision.  Past Medical History  Diagnosis Date  . Polymyalgia rheumatica   . Hypertension   . Hyperlipidemia   . Diabetic neuropathy   . Osteomyelitis of foot, left, acute   . Anxiety   . Insomnia, unspecified   . Unspecified vitamin D deficiency   . Anemia, unspecified   . Other chronic postoperative pain   . Unspecified hereditary and idiopathic peripheral neuropathy   . Allergy   . Unspecified osteomyelitis, site unspecified   . Long term (current) use of anticoagulants   . 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 Ulcer     diabetic foot   . GERD (gastroesophageal reflux disease) 01/27/2009  . Diabetes mellitus 01/27/2009  . Arthralgia 2010    polyarticular  . Hemorrhoids, internal 10/2011    small  . Myocardial infarction     mild 1995  . Stroke 01/10/06    no residual at this ti,e  . CHF (congestive heart failure)   . CHF (congestive heart failure) 07/25/2009    denies  . Pneumonia     hx  . Chronic kidney disease     Followed by Dr Justin Mend tues,thurs,sat  . Hemodialysis access site with mature fistula   . Sleep apnea     hx of, had surgery    Past Surgical History  Procedure Laterality Date  . Amputation  01/21/2012    Procedure: AMPUTATION RAY;  Surgeon: [name], MD;  Location: [facility];  Service: Orthopedics;  Laterality: Left;  Left Foot 4th Ray Amputation  . Cervical discectomy  02/2011    Fusion  . Arthroscopic knee  surgery Left 08-25-2012  . Amputation right foot digits 1-4    . Eye surgery  Bilateral     [facility]  . Bascilic vein transposition Left 10/19/2012    Procedure: BASCILIC VEIN TRANSPOSITION;  Surgeon: Serafina Mitchell, MD;  Location: [facility];  Service: Vascular;  Laterality: Left;  . Tonsillectomy    . Amputation Left 05/04/2013    Procedure: AMPUTATION DIGIT;  Surgeon: [name], MD;  Location: [facility];  Service: Orthopedics;  Laterality: Left;  Left Great Toe Amputation at [facility]  . Toe surgery Left April 2015    Big toe removed on left foot.  . Total knee arthroplasty Left 04/10/2014    Procedure: TOTAL KNEE ARTHROPLASTY;  Surgeon: [name], MD;  Location: [facility];  Service: Orthopedics;  Laterality: Left;    Family History  Problem Relation Age of Onset  . Hypertension Mother   . Cancer Mother 97    Ovarian  . Heart disease Maternal Aunt   . Stroke Maternal Grandfather    Social History:  reports that he has been smoking Cigarettes.  He has a 3.75 pack-year smoking history. He has never used smokeless tobacco. He reports that he does not drink alcohol or use illicit drugs.  Allergies:  Allergies  Allergen Reactions  . Morphine And Related     hallucinations  . Tygacil [Tigecycline] Nausea And Vomiting, Nausea Only  and Other (See Comments)     (Not in a hospital admission)  Results for orders placed or performed during the hospital encounter of 05/09/14 (from the past 48 hour(s))  CBC with Differential/Platelet     Status: Abnormal   Collection Time: 05/09/14  4:32 PM  Result Value Ref Range   WBC 6.9 4.0 - 10.5 K/uL   RBC 3.78 (L) 4.22 - 5.81 MIL/uL   Hemoglobin 10.6 (L) 13.0 - 17.0 g/dL   HCT 34.1 (L) 39.0 - 52.0 %   MCV 90.2 78.0 - 100.0 fL   MCH 28.0 26.0 - 34.0 pg   MCHC 31.1 30.0 - 36.0 g/dL   RDW 14.9 11.5 - 15.5 %   Platelets 248 150 - 400 K/uL   Neutrophils Relative % 51 43 - 77 %   Neutro Abs 3.6 1.7 - 7.7 K/uL   Lymphocytes Relative 31 12 - 46 %   Lymphs Abs 2.2 0.7 - 4.0 K/uL   Monocytes Relative 14 (H) 3 - 12 %   Monocytes Absolute  1.0 0.1 - 1.0 K/uL   Eosinophils Relative 3 0 - 5 %   Eosinophils Absolute 0.2 0.0 - 0.7 K/uL   Basophils Relative 1 0 - 1 %   Basophils Absolute 0.1 0.0 - 0.1 K/uL  I-stat chem 8, ed     Status: Abnormal   Collection Time: 05/09/14  5:18 PM  Result Value Ref Range   Sodium 137 135 - 145 mmol/L   Potassium 4.8 3.5 - 5.1 mmol/L   Chloride 100 96 - 112 mmol/L   BUN 11 6 - 23 mg/dL   Creatinine, Ser 3.10 (H) 0.50 - 1.35 mg/dL   Glucose, Bld 121 (H) 70 - 99 mg/dL   Calcium, Ion 1.13 1.12 - 1.23 mmol/L   TCO2 24 0 - 100 mmol/L   Hemoglobin 11.2 (L) 13.0 - 17.0 g/dL   HCT 33.0 (L) 39.0 - 52.0 %   Dg Knee Complete 4 Views Left  05/09/2014   CLINICAL DATA:  Pt states he tripped over rug in his home earlier today and tore sutures from left knee replacement 4 weeks ago - now an open wound - pain concentrated anterior patella area  EXAM: LEFT KNEE - COMPLETE 4+ VIEW  COMPARISON:  None.  FINDINGS: Left knee prosthetic noted. There is prepatellar swelling and a moderate suprapatellar joint effusion. No evidence of fracture.  IMPRESSION: 1. No evidence fracture or dislocation. 2. Suprapatellar joint effusion. 3. Prepatellar soft tissue swelling.   Electronically Signed   By: Suzy Bouchard M.D.   On: 05/09/2014 16:58    Review of Systems  All other systems reviewed and are negative.   Blood pressure 150/74, pulse 98, temperature 98.1 F (36.7 C), temperature source Oral, resp. rate 18, SpO2 100 %. Physical Exam   On examination patient has a open traumatic laceration of the surgical incision total knee arthroplasty. Assessment/Plan Assessment: Traumatic rupture dehiscence left total knee arthroplasty incision.  Plan: We'll plan for irrigation excision of skin and soft tissue wound closure. Risks and benefits were discussed including risk of the total knee infection. We're proceeding with surgery urgently.  DUDA,MARCUS V 05/09/2014, 8:13 PM

## 2014-05-09 NOTE — Anesthesia Procedure Notes (Signed)
Procedure Name: LMA Insertion Performed by: Zorita Pang Pre-anesthesia Checklist: Patient identified, Patient being monitored, Timeout performed, Emergency Drugs available and Suction available Patient Re-evaluated:Patient Re-evaluated prior to inductionOxygen Delivery Method: Circle system utilized Preoxygenation: Pre-oxygenation with 100% oxygen Intubation Type: IV induction Ventilation: Mask ventilation without difficulty LMA Size: 4.0 Tube type: Oral Number of attempts: 2 Placement Confirmation: breath sounds checked- equal and bilateral and positive ETCO2 Tube secured with: Tape Dental Injury: Teeth and Oropharynx as per pre-operative assessment

## 2014-05-09 NOTE — Transfer of Care (Signed)
Immediate Anesthesia Transfer of Care Note  Patient: Matthew Kline  Procedure(s) Performed: Procedure(s): Irrigation and Debridement Left Knee and Closure of Total Knee Arthroplasty Incision (Left)  Patient Location: PACU  Anesthesia Type:General  Level of Consciousness: awake, alert  and oriented  Airway & Oxygen Therapy: Patient Spontanous Breathing and Patient connected to nasal cannula oxygen  Post-op Assessment: Report given to RN and Post -op Vital signs reviewed and stable  Post vital signs: Reviewed and stable  Last Vitals:  Filed Vitals:   05/09/14 2109  BP: 134/82  Pulse: 96  Temp: 36.8 C  Resp: 16    Complications: No apparent anesthesia complications

## 2014-05-10 ENCOUNTER — Encounter (HOSPITAL_COMMUNITY): Payer: Self-pay | Admitting: Orthopedic Surgery

## 2014-05-10 LAB — GLUCOSE, CAPILLARY
GLUCOSE-CAPILLARY: 97 mg/dL (ref 70–99)
Glucose-Capillary: 126 mg/dL — ABNORMAL HIGH (ref 70–99)

## 2014-05-10 NOTE — Discharge Summary (Signed)
Physician Discharge Summary  [name] MRN: [id number] DOB/AGE: Aug 09, 1960 54 y.o.  Admit date: 05/09/2014 Discharge date: 05/10/2014  Admission Diagnoses: Dehiscence total knee arthroplasty  Discharge Diagnoses:  Active Problems:   Surgical wound dehiscence   Dehiscence of closure of skin   Discharged Condition: stable  Hospital Course: Patient's hospital course was essentially unremarkable. You underwent excision of the skin soft tissue of the wound dehiscence. Placement of antibiotic beads local wound closure. Patient was kept overnight for IV antibiotics and discharged to home in the morning.  Consults: None  Significant Diagnostic Studies: labs: Routine labs  Treatments: surgery: Dressing clean and dry  Discharge Exam: Blood pressure 148/82, pulse 88, temperature 98 F (36.7 C), temperature source Oral, resp. rate 13, SpO2 96 %. Incision/Wound:  Disposition: 06-Home-Health Care Svc  Discharge Instructions    Call MD / Call 911    Complete by:  As directed   If you experience chest pain or shortness of breath, CALL 911 and be transported to the hospital emergency room.  If you develope a fever above 101 F, pus (white drainage) or increased drainage or redness at the wound, or calf pain, call your surgeon's office.     Constipation Prevention    Complete by:  As directed   Drink plenty of fluids.  Prune juice may be helpful.  You may use a stool softener, such as Colace (over the counter) 100 mg twice a day.  Use MiraLax (over the counter) for constipation as needed.     Diet - low sodium heart healthy    Complete by:  As directed      Increase activity slowly as tolerated    Complete by:  As directed             Medication List    TAKE these medications        amitriptyline 50 MG tablet  Commonly known as:  ELAVIL  Take 1 tablet (50 mg total) by mouth at bedtime.     amLODipine 5 MG tablet  Commonly known as:  NORVASC  Take 1 tablet by  mouth daily.     calcium acetate 667 MG capsule  Commonly known as:  PHOSLO  Take 667 mg by mouth 3 (three) times daily with meals.     gabapentin 100 MG capsule  Commonly known as:  NEURONTIN  Take 2 capsules (200 mg total) by mouth 2 (two) times daily.     glipiZIDE 5 MG tablet  Commonly known as:  GLUCOTROL  Take 1 tablet (5 mg total) by mouth daily before breakfast.     HYDROcodone-acetaminophen 5-325 MG per tablet  Commonly known as:  NORCO/VICODIN  Take 1 tablet by mouth every 4 (four) hours as needed for moderate pain.     ranitidine 150 MG tablet  Commonly known as:  ZANTAC  Take 150 mg by mouth daily as needed for heartburn.     sildenafil 25 MG tablet  Commonly known as:  VIAGRA  Take 2 tablets (50 mg total) by mouth daily as needed for erectile dysfunction.     VITAMIN D PO  Take 1 tablet by mouth 3 (three) times a week. At dialysis-MWF     zolpidem 10 MG tablet  Commonly known as:  AMBIEN  Take 1 tablet (10 mg total) by mouth at bedtime as needed for sleep.           Follow-up Information    Follow up with [name], MD In  1 week.   Specialty:  Orthopedic Surgery   Contact information:   [place] [address] [phone]       Signed: [name] 05/10/2014, 12:02 PM

## 2014-05-10 NOTE — Progress Notes (Signed)
Patient provided with discharge instructions and follow up information. He is going home with outpatient PT. Going home at this time with spouse.

## 2014-05-10 NOTE — Progress Notes (Signed)
Initial Nutrition Assessment  DOCUMENTATION CODES:  Non-severe (moderate) malnutrition in context of chronic illness  INTERVENTION:  Other (Comment) (Encourage meals and snacks through the day)  NUTRITION DIAGNOSIS:  Malnutrition related to chronic illness as evidenced by moderate depletion of body fat, moderate depletions of muscle mass.   GOAL:  Patient will meet greater than or equal to 90% of their needs   MONITOR:  PO intake, Weight trends, Labs, I & O's  REASON FOR ASSESSMENT:  Malnutrition Screening Tool    ASSESSMENT: Patient is a 54 year old gentleman who is about 4 weeks status post left total knee arthroplasty. Patient states for the last several days he is been completely asymptomatic has had no pain in his knee states its best is felt in the past 6 years. Patient does states that he fell today sustaining the traumatic rupture of his surgical total knee incision.    Pt is feeling ok at time of visit. Lower extremities obviously swollen. Pt expressed concern about recent weight loss (not significant for time frame) and it was not due to fluid on HD. Pt states he only eats one or two meals a day. Encouraged to consume 2-3 meals and several snacks, especially considering that pt is also diabetic. Pt refuses supplements, stating that he has plenty of Nepro at home and dislikes Ensure. Also declined snacks since he should be released today. Pt very grateful for the visit and information.  Height:  Ht Readings from Last 1 Encounters:  04/26/14 5\' 10"  (1.778 m)    Weight:  Wt Readings from Last 1 Encounters:  04/26/14 218 lb 6.4 oz (99.066 kg)    Ideal Body Weight:  75.45 kg  Wt Readings from Last 10 Encounters:  04/26/14 218 lb 6.4 oz (99.066 kg)  04/12/14 216 lb 14.9 oz (98.4 kg)  10/24/13 228 lb 6.4 oz (103.602 kg)  07/12/13 231 lb 3.2 oz (104.872 kg)  06/27/13 227 lb 12.8 oz (103.329 kg)  05/22/13 224 lb 6.4 oz (101.787 kg)  05/04/13 222 lb (100.699  kg)  03/28/13 224 lb 12.8 oz (101.969 kg)  12/25/12 221 lb 9.6 oz (100.517 kg)  12/19/12 224 lb (101.606 kg)    BMI:  There is no weight on file to calculate BMI.  Estimated Nutritional Needs:  Kcal:  2300 - 2500   Protein:  125 - 145 g   Fluid:  per MD  Skin:  Wound (see comment) (surgical incision on L knee)  Diet Order:  Diet renal/carb modified with fluid restriction Diet-HS Snack?: Nothing; Room service appropriate?: Yes; Fluid consistency:: Thin  EDUCATION NEEDS:  Education needs addressed   Intake/Output Summary (Last 24 hours) at 05/10/14 1201 Last data filed at 05/10/14 0700  Gross per 24 hour  Intake 884.83 ml  Output    650 ml  Net 234.83 ml    Last BM:  4/28  Haile Bosler A. [facility] Dietetic Intern Pager: [phone] 05/10/2014 12:01 PM

## 2014-05-10 NOTE — Evaluation (Addendum)
Physical Therapy Evaluation Patient Details Name: Matthew Kline MRN: [id number] DOB: [birth year] Today's Date: 05/10/2014   History of Present Illness  Pt. admitted under observation 05/09/14 after a fall resulting in wound deshiscence of recent L TKA.  Pt. with significant history of PR, HTN, diabetes, ESRD on HD MWF, diabetic neuropathy, multiple toe and ray amputations.  He underwent surgical repair of wound with antibiotic beads  on 05/09/14.  Clinical Impression  Pt. Presents to PT with need for return to RW following his surgical repair of L TKA wound yesterday.  He had advanced to use of cane most of time PTA.  He demonstrates improving strength and ROM and recommendation is for him to resume his OPPT when cleared by Dr. Sharol Given to do so.  He has no further acute PT needs, so I will sign off as he is walking a a modified independent level with RW.  Incidentally, wife reported she mistakenly left pt's cane in the pre-op area yesterday.  I was able to locate his cane and return it to them.      Follow Up Recommendations Outpatient PT;Other (comment) (pt. should resume OPPT when OK with Dr. Sharol Given)    Equipment Recommendations  None recommended by PT    Recommendations for Other Services       Precautions / Restrictions Precautions Precautions: Fall;Knee Precaution Booklet Issued: No Precaution Comments: Pt. able to state/demo knee precautions and exercises Restrictions Weight Bearing Restrictions: Yes LLE Weight Bearing: Weight bearing as tolerated      Mobility  Bed Mobility Overal bed mobility: Modified Independent             General bed mobility comments: no difficulties  Transfers Overall transfer level: Modified independent Equipment used: Rolling walker (2 wheeled)             General transfer comment: pt. able to rise to stand from bed at mod I level of assist to RW, good technique for sit<>stand  Ambulation/Gait Ambulation/Gait assistance: Modified  independent (Device/Increase time) Ambulation Distance (Feet): 150 Feet Assistive device: Rolling walker (2 wheeled) Gait Pattern/deviations: Step-through pattern Gait velocity: normal to fast (cued to slow down at times)   General Gait Details: Pt. with good sequencing but sometimes a little faster than is currently safe .  Needed two reminders to slow down slightly.  Overall, no LOB noted and otherwise appeared safe with RW  Stairs Stairs: Yes Stairs assistance: Modified independent (Device/Increase time) Stair Management: One rail Right;Forwards Number of Stairs: 2 General stair comments: Pt. states he has a "post" he uses to stabilize himself on the right.  He used the railing in a similar fashion to his "post" at home and was able to negotiate 2 steps without loss of balance or assistance  Wheelchair Mobility    Modified Rankin (Stroke Patients Only)       Balance Overall balance assessment: Needs assistance Sitting-balance support: No upper extremity supported;Feet supported Sitting balance-Leahy Scale: Normal     Standing balance support: Single extremity supported;During functional activity Standing balance-Leahy Scale: Poor Standing balance comment: Needed support of RW for stability this am                             Pertinent Vitals/Pain Pain Assessment: No/denies pain (stated "just a little sore" in left knee)    Home Living Family/patient expects to be discharged to:: Private residence Living Arrangements: Spouse/significant other;Children;Other relatives Available Help at Discharge: Family Type of  Home: House Home Access: Stairs to enter Entrance Stairs-Rails: None Entrance Stairs-Number of Steps: 2 Home Layout: One level Home Equipment: [place] - 4 wheels;Cane - single point      Prior Function Level of Independence: Independent         Comments: Had returned to driving following his TKA; drives self to HD     Hand Dominance         Extremity/Trunk Assessment   Upper Extremity Assessment: Overall WFL for tasks assessed           Lower Extremity Assessment: LLE deficits/detail   LLE Deficits / Details: can actively SLR with <10 extensor lag; states he was -8 degrees of extension at his last OPPT session.  Actively flexes knee to ~100  Cervical / Trunk Assessment: Normal  Communication   Communication: No difficulties  Cognition Arousal/Alertness: Awake/alert Behavior During Therapy: WFL for tasks assessed/performed Overall Cognitive Status: Within Functional Limits for tasks assessed                      General Comments      Exercises Total Joint Exercises Ankle Circles/Pumps: AROM;Both;10 reps Quad Sets: AROM;Left;10 reps Straight Leg Raises: AROM;Left;5 reps Long Arc Quad: AROM;Left;5 reps      Assessment/Plan    PT Assessment All further PT needs can be met in the next venue of care  PT Diagnosis Abnormality of gait   PT Problem List Decreased strength;Decreased range of motion;Decreased balance;Decreased safety awareness  PT Treatment Interventions     PT Goals (Current goals can be found in the Care Plan section) Acute Rehab PT Goals Patient Stated Goal: resuem OPPT PT Goal Formulation: All assessment and education complete, DC therapy    Frequency     Barriers to discharge        Co-evaluation               End of Session Equipment Utilized During Treatment: Gait belt Activity Tolerance: Patient tolerated treatment well Patient left: in chair;with call bell/phone within reach;with family/visitor present Nurse Communication: Mobility status    Functional Assessment Tool Used: clinical observation/judgement Functional Limitation: Mobility: Walking and moving around Mobility: Walking and Moving Around Current Status ([id number]): At least 1 percent but less than 20 percent impaired, limited or restricted Mobility: Walking and Moving Around Goal Status [phone]): At  least 1 percent but less than 20 percent impaired, limited or restricted Mobility: Walking and Moving Around Discharge Status [phone]): At least 1 percent but less than 20 percent impaired, limited or restricted    Time: 2878-6767 PT Time Calculation (min) (ACUTE ONLY): 27 min   Charges:   PT Evaluation $Initial PT Evaluation Tier I: 1 Procedure PT Treatments $Gait Training: 8-22 mins   PT G Codes:   PT G-Codes **NOT FOR INPATIENT CLASS** Functional Assessment Tool Used: clinical observation/judgement Functional Limitation: Mobility: Walking and moving around Mobility: Walking and Moving Around Current Status (M0947): At least 1 percent but less than 20 percent impaired, limited or restricted Mobility: Walking and Moving Around Goal Status [phone]): At least 1 percent but less than 20 percent impaired, limited or restricted Mobility: Walking and Moving Around Discharge Status [phone]): At least 1 percent but less than 20 percent impaired, limited or restricted    Ladona Ridgel 05/10/2014, 10:10 AM Gerlean Ren PT Acute Rehab Services [phone] [place] [phone]

## 2014-05-20 NOTE — Anesthesia Postprocedure Evaluation (Signed)
  Anesthesia Post-op Note  Patient: Matthew Kline  Procedure(s) Performed: Procedure(s): Irrigation and Debridement Left Knee and Closure of Total Knee Arthroplasty Incision (Left)  Patient Location: PACU  Anesthesia Type:General  Level of Consciousness: awake and alert   Airway and Oxygen Therapy: Patient Spontanous Breathing  Post-op Pain: mild  Post-op Assessment: Post-op Vital signs reviewed  Post-op Vital Signs: Reviewed  Last Vitals:  Filed Vitals:   05/10/14 1238  BP: 150/81  Pulse: 92  Temp: 37.2 C  Resp: 16    Complications: No apparent anesthesia complications

## 2014-05-30 ENCOUNTER — Other Ambulatory Visit (HOSPITAL_COMMUNITY): Payer: Self-pay | Admitting: Orthopedic Surgery

## 2014-05-31 ENCOUNTER — Inpatient Hospital Stay (HOSPITAL_COMMUNITY): Payer: Commercial Managed Care - HMO | Admitting: Anesthesiology

## 2014-05-31 ENCOUNTER — Encounter (HOSPITAL_COMMUNITY): Payer: Self-pay | Admitting: Anesthesiology

## 2014-05-31 ENCOUNTER — Inpatient Hospital Stay (HOSPITAL_COMMUNITY)
Admission: RE | Admit: 2014-05-31 | Discharge: 2014-06-08 | DRG: 466 | Disposition: A | Discharge status: Home Health | Payer: Commercial Managed Care - HMO | Source: Ambulatory Visit | Attending: Orthopedic Surgery | Admitting: Orthopedic Surgery

## 2014-05-31 ENCOUNTER — Encounter (HOSPITAL_COMMUNITY)
Admission: RE | Disposition: A | Discharge status: Home Health | Payer: Self-pay | Source: Ambulatory Visit | Attending: Orthopedic Surgery

## 2014-05-31 DIAGNOSIS — T8132XA Disruption of internal operation (surgical) wound, not elsewhere classified, initial encounter: Secondary | ICD-10-CM | POA: Diagnosis present

## 2014-05-31 DIAGNOSIS — Z89412 Acquired absence of left great toe: Secondary | ICD-10-CM

## 2014-05-31 DIAGNOSIS — Z992 Dependence on renal dialysis: Secondary | ICD-10-CM | POA: Diagnosis not present

## 2014-05-31 DIAGNOSIS — E1140 Type 2 diabetes mellitus with diabetic neuropathy, unspecified: Secondary | ICD-10-CM | POA: Diagnosis present

## 2014-05-31 DIAGNOSIS — W19XXXA Unspecified fall, initial encounter: Secondary | ICD-10-CM | POA: Diagnosis present

## 2014-05-31 DIAGNOSIS — D649 Anemia, unspecified: Secondary | ICD-10-CM | POA: Diagnosis present

## 2014-05-31 DIAGNOSIS — M353 Polymyalgia rheumatica: Secondary | ICD-10-CM | POA: Diagnosis present

## 2014-05-31 DIAGNOSIS — Y92009 Unspecified place in unspecified non-institutional (private) residence as the place of occurrence of the external cause: Secondary | ICD-10-CM

## 2014-05-31 DIAGNOSIS — I120 Hypertensive chronic kidney disease with stage 5 chronic kidney disease or end stage renal disease: Secondary | ICD-10-CM | POA: Diagnosis present

## 2014-05-31 DIAGNOSIS — N186 End stage renal disease: Secondary | ICD-10-CM | POA: Diagnosis present

## 2014-05-31 DIAGNOSIS — M898X9 Other specified disorders of bone, unspecified site: Secondary | ICD-10-CM | POA: Diagnosis present

## 2014-05-31 DIAGNOSIS — Z8673 Personal history of transient ischemic attack (TIA), and cerebral infarction without residual deficits: Secondary | ICD-10-CM | POA: Diagnosis not present

## 2014-05-31 DIAGNOSIS — E1121 Type 2 diabetes mellitus with diabetic nephropathy: Secondary | ICD-10-CM | POA: Diagnosis present

## 2014-05-31 DIAGNOSIS — Z89422 Acquired absence of other left toe(s): Secondary | ICD-10-CM | POA: Diagnosis not present

## 2014-05-31 DIAGNOSIS — N2581 Secondary hyperparathyroidism of renal origin: Secondary | ICD-10-CM | POA: Diagnosis present

## 2014-05-31 DIAGNOSIS — Z79899 Other long term (current) drug therapy: Secondary | ICD-10-CM

## 2014-05-31 DIAGNOSIS — T8453XA Infection and inflammatory reaction due to internal right knee prosthesis, initial encounter: Secondary | ICD-10-CM | POA: Diagnosis not present

## 2014-05-31 DIAGNOSIS — T8454XA Infection and inflammatory reaction due to internal left knee prosthesis, initial encounter: Principal | ICD-10-CM | POA: Diagnosis present

## 2014-05-31 DIAGNOSIS — I252 Old myocardial infarction: Secondary | ICD-10-CM

## 2014-05-31 DIAGNOSIS — F17210 Nicotine dependence, cigarettes, uncomplicated: Secondary | ICD-10-CM | POA: Diagnosis present

## 2014-05-31 DIAGNOSIS — Z9115 Patient's noncompliance with renal dialysis: Secondary | ICD-10-CM | POA: Diagnosis present

## 2014-05-31 DIAGNOSIS — T8450XA Infection and inflammatory reaction due to unspecified internal joint prosthesis, initial encounter: Secondary | ICD-10-CM

## 2014-05-31 DIAGNOSIS — B952 Enterococcus as the cause of diseases classified elsewhere: Secondary | ICD-10-CM | POA: Diagnosis present

## 2014-05-31 DIAGNOSIS — E1122 Type 2 diabetes mellitus with diabetic chronic kidney disease: Secondary | ICD-10-CM | POA: Diagnosis not present

## 2014-05-31 HISTORY — PX: I & D KNEE WITH POLY EXCHANGE: SHX5024

## 2014-05-31 LAB — RENAL FUNCTION PANEL
Albumin: 2.1 g/dL — ABNORMAL LOW (ref 3.5–5.0)
Anion gap: 13 (ref 5–15)
BUN: 41 mg/dL — ABNORMAL HIGH (ref 6–20)
CO2: 20 mmol/L — ABNORMAL LOW (ref 22–32)
Calcium: 8.8 mg/dL — ABNORMAL LOW (ref 8.9–10.3)
Chloride: 106 mmol/L (ref 101–111)
Creatinine, Ser: 7.21 mg/dL — ABNORMAL HIGH (ref 0.61–1.24)
GFR calc Af Amer: 9 mL/min — ABNORMAL LOW (ref >60)
GFR calc non Af Amer: 8 mL/min — ABNORMAL LOW (ref >60)
Glucose, Bld: 91 mg/dL (ref 65–99)
Phosphorus: 5.9 mg/dL — ABNORMAL HIGH (ref 2.5–4.6)
Potassium: 5 mmol/L (ref 3.5–5.1)
Sodium: 139 mmol/L (ref 135–145)

## 2014-05-31 LAB — COMPREHENSIVE METABOLIC PANEL
ALBUMIN: 2.6 g/dL — AB (ref 3.5–5.0)
ALT: 15 U/L — ABNORMAL LOW (ref 17–63)
AST: 17 U/L (ref 15–41)
Alkaline Phosphatase: 87 U/L (ref 38–126)
Anion gap: 14 (ref 5–15)
BUN: 40 mg/dL — ABNORMAL HIGH (ref 6–20)
CHLORIDE: 104 mmol/L (ref 101–111)
CO2: 21 mmol/L — AB (ref 22–32)
CREATININE: 7.28 mg/dL — AB (ref 0.61–1.24)
Calcium: 9.4 mg/dL (ref 8.9–10.3)
GFR calc Af Amer: 9 mL/min — ABNORMAL LOW (ref >60)
GFR calc non Af Amer: 8 mL/min — ABNORMAL LOW (ref >60)
Glucose, Bld: 104 mg/dL — ABNORMAL HIGH (ref 65–99)
Potassium: 4.5 mmol/L (ref 3.5–5.1)
SODIUM: 139 mmol/L (ref 135–145)
TOTAL PROTEIN: 7.8 g/dL (ref 6.5–8.1)
Total Bilirubin: 1 mg/dL (ref 0.3–1.2)

## 2014-05-31 LAB — CBC
HCT: 26.2 % — ABNORMAL LOW (ref 39.0–52.0)
HEMATOCRIT: 29.2 % — AB (ref 39.0–52.0)
Hemoglobin: 9.2 g/dL — ABNORMAL LOW (ref 13.0–17.0)
Hemoglobin: 8 g/dL — ABNORMAL LOW (ref 13.0–17.0)
MCH: 27.1 pg (ref 26.0–34.0)
MCH: 26.7 pg (ref 26.0–34.0)
MCHC: 31.5 g/dL (ref 30.0–36.0)
MCHC: 30.5 g/dL (ref 30.0–36.0)
MCV: 85.9 fL (ref 78.0–100.0)
MCV: 87.3 fL (ref 78.0–100.0)
PLATELETS: 348 10*3/uL (ref 150–400)
Platelets: 329 10*3/uL (ref 150–400)
RBC: 3.4 MIL/uL — ABNORMAL LOW (ref 4.22–5.81)
RBC: 3 MIL/uL — ABNORMAL LOW (ref 4.22–5.81)
RDW: 15.5 % (ref 11.5–15.5)
RDW: 15.8 % — ABNORMAL HIGH (ref 11.5–15.5)
WBC: 7.4 10*3/uL (ref 4.0–10.5)
WBC: 7 10*3/uL (ref 4.0–10.5)

## 2014-05-31 LAB — GLUCOSE, CAPILLARY
Glucose-Capillary: 95 mg/dL (ref 65–99)
Glucose-Capillary: 93 mg/dL (ref 65–99)

## 2014-05-31 LAB — PROTIME-INR
INR: 1.27 (ref 0.00–1.49)
Prothrombin Time: 16 seconds — ABNORMAL HIGH (ref 11.6–15.2)

## 2014-05-31 LAB — APTT: aPTT: 38 seconds — ABNORMAL HIGH (ref 24–37)

## 2014-05-31 SURGERY — IRRIGATION AND DEBRIDEMENT KNEE WITH POLY EXCHANGE
Anesthesia: General | Site: Knee | Laterality: Left

## 2014-05-31 MED ORDER — GENTAMICIN SULFATE 40 MG/ML IJ SOLN
INTRAMUSCULAR | Status: AC
  Filled 2014-05-31: qty 2

## 2014-05-31 MED ORDER — ONDANSETRON HCL 4 MG PO TABS: 4.0000 mg | ORAL_TABLET | Freq: Four times a day (QID) | ORAL | Status: DC | PRN

## 2014-05-31 MED ORDER — CALCIUM ACETATE 667 MG PO CAPS
667.0000 mg | ORAL_CAPSULE | Freq: Three times a day (TID) | ORAL | Status: DC
  Administered 2014-06-01 – 2014-06-02 (×3): 667 mg via ORAL
  Filled 2014-05-31 (×9): qty 1

## 2014-05-31 MED ORDER — GABAPENTIN 100 MG PO CAPS
200.0000 mg | ORAL_CAPSULE | Freq: Two times a day (BID) | ORAL | Status: DC
  Administered 2014-05-31 – 2014-06-02 (×4): 200 mg via ORAL
  Filled 2014-05-31 (×4): qty 2

## 2014-05-31 MED ORDER — FENTANYL CITRATE (PF) 250 MCG/5ML IJ SOLN
INTRAMUSCULAR | Status: AC
  Filled 2014-05-31: qty 5

## 2014-05-31 MED ORDER — ZOLPIDEM TARTRATE 5 MG PO TABS
10.0000 mg | ORAL_TABLET | Freq: Every evening | ORAL | Status: DC | PRN
  Administered 2014-06-04: 10 mg via ORAL
  Filled 2014-05-31: qty 2

## 2014-05-31 MED ORDER — CALCITRIOL 0.5 MCG PO CAPS: 2.0000 ug | ORAL_CAPSULE | ORAL | Status: DC

## 2014-05-31 MED ORDER — METHOCARBAMOL 500 MG PO TABS
500.0000 mg | ORAL_TABLET | Freq: Four times a day (QID) | ORAL | Status: DC | PRN
  Administered 2014-06-02 – 2014-06-06 (×7): 500 mg via ORAL
  Filled 2014-05-31 (×7): qty 1

## 2014-05-31 MED ORDER — VANCOMYCIN HCL 500 MG IV SOLR
INTRAVENOUS | Status: AC
  Filled 2014-05-31: qty 1000

## 2014-05-31 MED ORDER — GENTAMICIN SULFATE 40 MG/ML IJ SOLN
INTRAMUSCULAR | Status: DC | PRN
  Administered 2014-05-31: 240 mg via INTRAMUSCULAR

## 2014-05-31 MED ORDER — ACETAMINOPHEN 10 MG/ML IV SOLN
INTRAVENOUS | Status: AC
  Filled 2014-05-31: qty 100

## 2014-05-31 MED ORDER — LACTATED RINGERS IV SOLN: INTRAVENOUS | Status: DC

## 2014-05-31 MED ORDER — HYDROMORPHONE HCL 1 MG/ML IJ SOLN
1.0000 mg | INTRAMUSCULAR | Status: DC | PRN
  Administered 2014-06-01 – 2014-06-04 (×15): 1 mg via INTRAVENOUS
  Filled 2014-05-31 (×15): qty 1

## 2014-05-31 MED ORDER — SODIUM CHLORIDE 0.9 % IV SOLN
INTRAVENOUS | Status: DC
  Administered 2014-05-31: 20:00:00 via INTRAVENOUS

## 2014-05-31 MED ORDER — DARBEPOETIN ALFA 150 MCG/0.3ML IJ SOSY
150.0000 ug | PREFILLED_SYRINGE | INTRAMUSCULAR | Status: DC
  Administered 2014-06-01: 150 ug via INTRAVENOUS
  Filled 2014-05-31: qty 0.3

## 2014-05-31 MED ORDER — ACETAMINOPHEN 325 MG PO TABS
650.0000 mg | ORAL_TABLET | Freq: Four times a day (QID) | ORAL | Status: DC | PRN
  Administered 2014-06-01 – 2014-06-02 (×2): 650 mg via ORAL
  Filled 2014-05-31 (×2): qty 2

## 2014-05-31 MED ORDER — PROMETHAZINE HCL 25 MG/ML IJ SOLN: 6.2500 mg | INTRAMUSCULAR | Status: DC | PRN

## 2014-05-31 MED ORDER — METOCLOPRAMIDE HCL 5 MG/ML IJ SOLN: 5.0000 mg | Freq: Three times a day (TID) | INTRAMUSCULAR | Status: DC | PRN

## 2014-05-31 MED ORDER — HYDROMORPHONE HCL 1 MG/ML IJ SOLN
INTRAMUSCULAR | Status: AC
  Administered 2014-05-31: 1 mg via INTRAVENOUS
  Filled 2014-05-31: qty 2

## 2014-05-31 MED ORDER — VANCOMYCIN HCL 1000 MG IV SOLR
INTRAVENOUS | Status: DC | PRN
  Administered 2014-05-31: 1000 mg

## 2014-05-31 MED ORDER — MIDAZOLAM HCL 5 MG/5ML IJ SOLN
INTRAMUSCULAR | Status: DC | PRN
  Administered 2014-05-31: 2 mg via INTRAVENOUS

## 2014-05-31 MED ORDER — ALTEPLASE 2 MG IJ SOLR
2.0000 mg | Freq: Once | INTRAMUSCULAR | Status: AC | PRN
  Filled 2014-05-31: qty 2

## 2014-05-31 MED ORDER — MIDAZOLAM HCL 2 MG/2ML IJ SOLN
INTRAMUSCULAR | Status: AC
  Filled 2014-05-31: qty 2

## 2014-05-31 MED ORDER — VANCOMYCIN HCL IN DEXTROSE 750-5 MG/150ML-% IV SOLN
750.0000 mg | Freq: Once | INTRAVENOUS | Status: AC
Start: 2014-05-31 — End: 2014-06-01
  Administered 2014-05-31: 750 mg via INTRAVENOUS
  Filled 2014-05-31: qty 150

## 2014-05-31 MED ORDER — VANCOMYCIN HCL 1000 MG IV SOLR
INTRAVENOUS | Status: AC
  Filled 2014-05-31: qty 1000

## 2014-05-31 MED ORDER — VITAMIN D 1000 UNITS PO TABS: 1000.0000 [IU] | ORAL_TABLET | ORAL | Status: DC

## 2014-05-31 MED ORDER — ACETAMINOPHEN 10 MG/ML IV SOLN
INTRAVENOUS | Status: DC | PRN
  Administered 2014-05-31: 1000 mg via INTRAVENOUS

## 2014-05-31 MED ORDER — SODIUM CHLORIDE 0.9 % IV SOLN
INTRAVENOUS | Status: DC | PRN
  Administered 2014-05-31: 16:00:00 via INTRAVENOUS

## 2014-05-31 MED ORDER — HYDROMORPHONE HCL 1 MG/ML IJ SOLN
INTRAMUSCULAR | Status: AC
  Filled 2014-05-31: qty 1

## 2014-05-31 MED ORDER — CEFAZOLIN SODIUM-DEXTROSE 2-3 GM-% IV SOLR
INTRAVENOUS | Status: AC
  Filled 2014-05-31: qty 50

## 2014-05-31 MED ORDER — PENTAFLUOROPROP-TETRAFLUOROETH EX AERO: 1.0000 "application " | INHALATION_SPRAY | CUTANEOUS | Status: DC | PRN

## 2014-05-31 MED ORDER — NEPRO/CARBSTEADY PO LIQD
237.0000 mL | ORAL | Status: DC | PRN
  Filled 2014-05-31: qty 237

## 2014-05-31 MED ORDER — WARFARIN SODIUM 7.5 MG PO TABS: 7.5000 mg | ORAL_TABLET | Freq: Once | ORAL | Status: DC

## 2014-05-31 MED ORDER — PIPERACILLIN-TAZOBACTAM IN DEX 2-0.25 GM/50ML IV SOLN
2.2500 g | Freq: Three times a day (TID) | INTRAVENOUS | Status: DC
  Administered 2014-05-31 – 2014-06-03 (×8): 2.25 g via INTRAVENOUS
  Filled 2014-05-31 (×10): qty 50

## 2014-05-31 MED ORDER — VANCOMYCIN HCL IN DEXTROSE 1-5 GM/200ML-% IV SOLN: 1000.0000 mg | Freq: Two times a day (BID) | INTRAVENOUS | Status: DC

## 2014-05-31 MED ORDER — PHENYLEPHRINE 40 MCG/ML (10ML) SYRINGE FOR IV PUSH (FOR BLOOD PRESSURE SUPPORT)
PREFILLED_SYRINGE | INTRAVENOUS | Status: AC
  Filled 2014-05-31: qty 10

## 2014-05-31 MED ORDER — SODIUM CHLORIDE 0.9 % IV SOLN: 100.0000 mL | INTRAVENOUS | Status: DC | PRN

## 2014-05-31 MED ORDER — FENTANYL CITRATE (PF) 100 MCG/2ML IJ SOLN
INTRAMUSCULAR | Status: DC | PRN
  Administered 2014-05-31 (×2): 25 ug via INTRAVENOUS
  Administered 2014-05-31: 50 ug via INTRAVENOUS
  Administered 2014-05-31 (×4): 25 ug via INTRAVENOUS
  Administered 2014-05-31: 50 ug via INTRAVENOUS

## 2014-05-31 MED ORDER — LIDOCAINE HCL (CARDIAC) 20 MG/ML IV SOLN
INTRAVENOUS | Status: DC | PRN
  Administered 2014-05-31: 80 mg via INTRAVENOUS

## 2014-05-31 MED ORDER — AMLODIPINE BESYLATE 5 MG PO TABS
5.0000 mg | ORAL_TABLET | Freq: Every day | ORAL | Status: DC
  Administered 2014-05-31 – 2014-06-07 (×7): 5 mg via ORAL
  Filled 2014-05-31 (×7): qty 1

## 2014-05-31 MED ORDER — ONDANSETRON HCL 4 MG/2ML IJ SOLN
INTRAMUSCULAR | Status: DC | PRN
  Administered 2014-05-31: 4 mg via INTRAVENOUS

## 2014-05-31 MED ORDER — SODIUM CHLORIDE 0.9 % IV SOLN
INTRAVENOUS | Status: DC
  Administered 2014-05-31: 15:00:00 via INTRAVENOUS

## 2014-05-31 MED ORDER — HYDROMORPHONE HCL 1 MG/ML IJ SOLN
0.2500 mg | INTRAMUSCULAR | Status: DC | PRN
  Administered 2014-05-31: 0.5 mg via INTRAVENOUS
  Administered 2014-05-31: 1 mg via INTRAVENOUS
  Administered 2014-05-31: 0.5 mg via INTRAVENOUS

## 2014-05-31 MED ORDER — ONDANSETRON HCL 4 MG/2ML IJ SOLN: 4.0000 mg | Freq: Four times a day (QID) | INTRAMUSCULAR | Status: DC | PRN

## 2014-05-31 MED ORDER — LIDOCAINE HCL (PF) 1 % IJ SOLN: 5.0000 mL | INTRAMUSCULAR | Status: DC | PRN

## 2014-05-31 MED ORDER — OXYCODONE HCL 5 MG PO TABS
5.0000 mg | ORAL_TABLET | ORAL | Status: DC | PRN
  Administered 2014-05-31 – 2014-06-04 (×13): 10 mg via ORAL
  Filled 2014-05-31 (×14): qty 2

## 2014-05-31 MED ORDER — PHENYLEPHRINE HCL 10 MG/ML IJ SOLN
INTRAMUSCULAR | Status: DC | PRN
  Administered 2014-05-31 (×3): 80 ug via INTRAVENOUS

## 2014-05-31 MED ORDER — ACETAMINOPHEN 650 MG RE SUPP: 650.0000 mg | Freq: Four times a day (QID) | RECTAL | Status: DC | PRN

## 2014-05-31 MED ORDER — METOCLOPRAMIDE HCL 5 MG PO TABS: 5.0000 mg | ORAL_TABLET | Freq: Three times a day (TID) | ORAL | Status: DC | PRN

## 2014-05-31 MED ORDER — FAMOTIDINE 20 MG PO TABS
10.0000 mg | ORAL_TABLET | Freq: Every day | ORAL | Status: DC
  Administered 2014-05-31 – 2014-06-07 (×8): 10 mg via ORAL
  Filled 2014-05-31 (×8): qty 1

## 2014-05-31 MED ORDER — INSULIN ASPART 100 UNIT/ML ~~LOC~~ SOLN
0.0000 [IU] | Freq: Three times a day (TID) | SUBCUTANEOUS | Status: DC
  Administered 2014-06-01: 1 [IU] via SUBCUTANEOUS
  Administered 2014-06-03: 2 [IU] via SUBCUTANEOUS

## 2014-05-31 MED ORDER — GLIPIZIDE 5 MG PO TABS
5.0000 mg | ORAL_TABLET | Freq: Every day | ORAL | Status: DC
  Administered 2014-06-01 – 2014-06-07 (×6): 5 mg via ORAL
  Filled 2014-05-31 (×6): qty 1

## 2014-05-31 MED ORDER — LIDOCAINE HCL (CARDIAC) 20 MG/ML IV SOLN
INTRAVENOUS | Status: AC
  Filled 2014-05-31: qty 5

## 2014-05-31 MED ORDER — PIPERACILLIN-TAZOBACTAM 3.375 G IVPB: 3.3750 g | Freq: Four times a day (QID) | INTRAVENOUS | Status: DC

## 2014-05-31 MED ORDER — AMITRIPTYLINE HCL 50 MG PO TABS
50.0000 mg | ORAL_TABLET | Freq: Every day | ORAL | Status: DC
  Administered 2014-05-31 – 2014-06-07 (×8): 50 mg via ORAL
  Filled 2014-05-31 (×8): qty 1

## 2014-05-31 MED ORDER — FENTANYL CITRATE (PF) 100 MCG/2ML IJ SOLN
INTRAMUSCULAR | Status: AC
  Administered 2014-05-31: 100 ug
  Filled 2014-05-31: qty 2

## 2014-05-31 MED ORDER — WARFARIN SODIUM 7.5 MG PO TABS
7.5000 mg | ORAL_TABLET | Freq: Once | ORAL | Status: AC
  Administered 2014-05-31: 7.5 mg via ORAL
  Filled 2014-05-31: qty 1

## 2014-05-31 MED ORDER — CEFAZOLIN SODIUM-DEXTROSE 2-3 GM-% IV SOLR
INTRAVENOUS | Status: DC | PRN
  Administered 2014-05-31: 2 g via INTRAVENOUS

## 2014-05-31 MED ORDER — DEXTROSE 5 % IV SOLN
500.0000 mg | Freq: Four times a day (QID) | INTRAVENOUS | Status: DC | PRN
  Filled 2014-05-31: qty 5

## 2014-05-31 MED ORDER — PROPOFOL 10 MG/ML IV BOLUS
INTRAVENOUS | Status: DC | PRN
  Administered 2014-05-31: 200 mg via INTRAVENOUS

## 2014-05-31 MED ORDER — WARFARIN - PHARMACIST DOSING INPATIENT
Freq: Every day | Status: DC
  Administered 2014-06-01 – 2014-06-06 (×2)

## 2014-05-31 MED ORDER — LIDOCAINE-PRILOCAINE 2.5-2.5 % EX CREA
1.0000 "application " | TOPICAL_CREAM | CUTANEOUS | Status: DC | PRN
  Filled 2014-05-31: qty 5

## 2014-05-31 SURGICAL SUPPLY — 43 items
BLADE SURG 10 STRL SS (BLADE) IMPLANT
BLADE SURG 21 STRL SS (BLADE) ×3 IMPLANT
BNDG COHESIVE 4X5 TAN STRL (GAUZE/BANDAGES/DRESSINGS) IMPLANT
BNDG COHESIVE 6X5 TAN STRL LF (GAUZE/BANDAGES/DRESSINGS) IMPLANT
BNDG GAUZE ELAST 4 BULKY (GAUZE/BANDAGES/DRESSINGS) ×6 IMPLANT
COVER SURGICAL LIGHT HANDLE (MISCELLANEOUS) ×6 IMPLANT
DRAPE U-SHAPE 47X51 STRL (DRAPES) ×3 IMPLANT
DRSG ADAPTIC 3X8 NADH LF (GAUZE/BANDAGES/DRESSINGS) ×3 IMPLANT
DRSG VAC ATS SM SENSATRAC (GAUZE/BANDAGES/DRESSINGS) ×3 IMPLANT
DURAPREP 26ML APPLICATOR (WOUND CARE) ×3 IMPLANT
ELECT CAUTERY BLADE 6.4 (BLADE) IMPLANT
ELECT REM PT RETURN 9FT ADLT (ELECTROSURGICAL)
ELECTRODE REM PT RTRN 9FT ADLT (ELECTROSURGICAL) IMPLANT
GAUZE SPONGE 4X4 12PLY STRL (GAUZE/BANDAGES/DRESSINGS) ×3 IMPLANT
GLOVE BIOGEL PI IND STRL 9 (GLOVE) ×1 IMPLANT
GLOVE BIOGEL PI INDICATOR 9 (GLOVE) ×2
GLOVE SURG ORTHO 9.0 STRL STRW (GLOVE) ×3 IMPLANT
GOWN STRL REUS W/ TWL LRG LVL3 (GOWN DISPOSABLE) ×1 IMPLANT
GOWN STRL REUS W/ TWL XL LVL3 (GOWN DISPOSABLE) ×2 IMPLANT
GOWN STRL REUS W/TWL LRG LVL3 (GOWN DISPOSABLE) ×2
GOWN STRL REUS W/TWL XL LVL3 (GOWN DISPOSABLE) ×4
HANDPIECE INTERPULSE COAX TIP (DISPOSABLE)
KIT BASIN OR (CUSTOM PROCEDURE TRAY) ×3 IMPLANT
KIT ROOM TURNOVER OR (KITS) ×3 IMPLANT
KIT STIMULAN RAPID CURE  10CC (Orthopedic Implant) ×2 IMPLANT
KIT STIMULAN RAPID CURE 10CC (Orthopedic Implant) ×1 IMPLANT
MANIFOLD NEPTUNE II (INSTRUMENTS) ×3 IMPLANT
NS IRRIG 1000ML POUR BTL (IV SOLUTION) ×3 IMPLANT
PACK ORTHO EXTREMITY (CUSTOM PROCEDURE TRAY) ×3 IMPLANT
PAD ARMBOARD 7.5X6 YLW CONV (MISCELLANEOUS) ×6 IMPLANT
SET HNDPC FAN SPRY TIP SCT (DISPOSABLE) IMPLANT
STOCKINETTE IMPERVIOUS 9X36 MD (GAUZE/BANDAGES/DRESSINGS) IMPLANT
SUCTION FRAZIER TIP 10 FR DISP (SUCTIONS) ×3 IMPLANT
SWAB COLLECTION DEVICE MRSA (MISCELLANEOUS) ×3 IMPLANT
TIBIAL INSERT SZ4 LT 02120410F (Insert) ×3 IMPLANT
TOWEL OR 17X24 6PK STRL BLUE (TOWEL DISPOSABLE) ×3 IMPLANT
TOWEL OR 17X26 10 PK STRL BLUE (TOWEL DISPOSABLE) ×3 IMPLANT
TUBE ANAEROBIC SPECIMEN COL (MISCELLANEOUS) IMPLANT
TUBE CONNECTING 12'X1/4 (SUCTIONS) ×1
TUBE CONNECTING 12X1/4 (SUCTIONS) ×2 IMPLANT
UNDERPAD 30X30 INCONTINENT (UNDERPADS AND DIAPERS) ×3 IMPLANT
WATER STERILE IRR 1000ML POUR (IV SOLUTION) ×3 IMPLANT
YANKAUER SUCT BULB TIP NO VENT (SUCTIONS) ×3 IMPLANT

## 2014-05-31 NOTE — Consult Note (Signed)
[facility] Renal Consultation Note    Indication for Consultation:  Management of ESRD/hemodialysis; anemia, hypertension/volume and secondary hyperparathyroidism PCP:  HPI: Matthew Kline is a 54 y.o. male with ESRD thought secondary to DM, w/ hx PVD, CVA, multiple toe amputations, who dialzyes MWF in the nocturnal program at [facility].He had a left TKR 3/30 2/16 by Dr. Sharol Given.  Since that time, he fell at home and had complete dehiscence of the surgical incision and subsequently had I and D and placement of antibiotic beads and closure.  He again has wound dehiscence and is admitted following repeat I and D and further investigation of the traumatic wound with planned post op VAC. He has been on oral antibiotics and has planned replacement of antibiotic beads with IV antibiotics post surgery.  He has no problems with his dialysis treatments, but has been missing some lately. Marland Kitchen He has had no recent N, V, D, fever, chills CP or SOB. Appetite is not what it was and he is losing weight and has chronic LE edema. He still makes urine. He is due for dialysis today.  Past Medical History  Diagnosis Date  . Polymyalgia rheumatica   . Hypertension   . Diabetic neuropathy   . Osteomyelitis of foot, left, acute   . Anxiety   . Insomnia, unspecified   . Unspecified vitamin D deficiency   . Anemia, unspecified   . Other chronic postoperative pain   . Unspecified hereditary and idiopathic peripheral neuropathy   . Allergy   . Unspecified osteomyelitis, site unspecified   . Long term (current) use of anticoagulants   . 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 Ulcer     diabetic foot   . Arthralgia 2010    polyarticular  . Hemorrhoids, internal 10/2011    small  . CHF (congestive heart failure)   . CHF (congestive heart failure) 07/25/2009    denies  . Hemodialysis access site with mature fistula   . Myocardial infarction 1995  . Pneumonia     "probably 4-5 times" (05/09/2014)   . Sleep apnea     "lost weight; no more problem" (05/09/2014)  . Type II diabetes mellitus dx'd 1995  . History of blood transfusion     "related to the anemia"  . GERD (gastroesophageal reflux disease)     hx "before I lost weight"  . ESRD (end stage renal disease) on dialysis     started 12/2012; "MWF; [employer] [employer]"  . Stroke 01/10/06    denies residual on 05/09/2014  . Arthritis     "back, knees" (05/09/2014)  . Chronic lower back pain    Past Surgical History  Procedure Laterality Date  . Amputation  01/21/2012    Procedure: AMPUTATION RAY;  Surgeon: [name], MD;  Location: [place];  Service: Orthopedics;  Laterality: Left;  Left Foot 4th Ray Amputation  . Anterior cervical decomp/discectomy fusion  02/2011  . Knee arthroscopy Left 08-25-2012  . Toe amputation Bilateral     "I've lost 7 toes over the last 7 years" (05/09/2014)  . Refractive surgery Bilateral   . Bascilic vein transposition Left 10/19/2012    Procedure: BASCILIC VEIN TRANSPOSITION;  Surgeon: Serafina Mitchell, MD;  Location: [place];  Service: Vascular;  Laterality: Left;  . Tonsillectomy    . Amputation Left 05/04/2013    Procedure: AMPUTATION DIGIT;  Surgeon: [name], MD;  Location: [place];  Service: Orthopedics;  Laterality: Left;  Left Great  Toe Amputation at [facility]TP  . Toe surgery Left April 2015    Big toe removed on left foot.  . Total knee arthroplasty Left 04/10/2014    Procedure: TOTAL KNEE ARTHROPLASTY;  Surgeon: [name], MD;  Location: [place];  Service: Orthopedics;  Laterality: Left;  . Wound debridement Left 05/09/2014    Dehiscence Left Total Knee Arthroplasty Incision  . Back surgery    . Uvulopalatopharyngoplasty, tonsillectomy and septoplasty  ~ 1989  . I&d extremity Left 05/09/2014    Procedure: Irrigation and Debridement Left Knee and Closure of Total Knee Arthroplasty Incision;  Surgeon: [name], MD;  Location: [place];  Service: Orthopedics;  Laterality: Left;   Family History   Problem Relation Age of Onset  . Hypertension Mother   . Cancer Mother 35    Ovarian  . Heart disease Maternal Aunt   . Stroke Maternal Grandfather    Social History:  reports that he has been smoking Cigarettes.  He has a 3.84 pack-year smoking history. He has never used smokeless tobacco. He reports that he does not drink alcohol or use illicit drugs. Allergies  Allergen Reactions  . Morphine And Related     hallucinations  . Tygacil [Tigecycline] Nausea And Vomiting, Nausea Only and Other (See Comments)   Prior to Admission medications   Medication Sig Start Date End Date Taking? Authorizing Provider  amitriptyline (ELAVIL) 50 MG tablet Take 1 tablet (50 mg total) by mouth at bedtime. 03/28/13   Blanchie Serve, MD  amLODipine (NORVASC) 5 MG tablet Take 1 tablet by mouth daily. 02/22/14   Historical Provider, MD  calcium acetate (PHOSLO) 667 MG capsule Take 667 mg by mouth 3 (three) times daily with meals.    Historical Provider, MD  Cholecalciferol (VITAMIN D PO) Take 1 tablet by mouth 3 (three) times a week. At dialysis-MWF    Historical Provider, MD  gabapentin (NEURONTIN) 100 MG capsule Take 2 capsules (200 mg total) by mouth 2 (two) times daily. Patient taking differently: Take 200 mg by mouth daily.  09/28/13   Blanchie Serve, MD  glipiZIDE (GLUCOTROL) 5 MG tablet Take 1 tablet (5 mg total) by mouth daily before breakfast. 10/24/13   Blanchie Serve, MD  HYDROcodone-acetaminophen (NORCO/VICODIN) 5-325 MG per tablet Take 1 tablet by mouth every 4 (four) hours as needed for moderate pain.  04/15/14   Historical Provider, MD  ranitidine (ZANTAC) 150 MG tablet Take 150 mg by mouth daily as needed for heartburn.     Historical Provider, MD  sildenafil (VIAGRA) 25 MG tablet Take 2 tablets (50 mg total) by mouth daily as needed for erectile dysfunction. 06/27/13   Blanchie Serve, MD  zolpidem (AMBIEN) 10 MG tablet Take 1 tablet (10 mg total) by mouth at bedtime as needed for sleep. 12/28/13    Tiffany L Reed, DO   No current facility-administered medications for this encounter.   Labs: Basic Metabolic Panel: No results for input(s): NA, K, CL, CO2, GLUCOSE, BUN, CREATININE, CALCIUM, ALB, PHOS in the last 168 hours. Liver Function Tests: No results for input(s): AST, ALT, ALKPHOS, BILITOT, PROT, ALBUMIN in the last 168 hours. No results for input(s): LIPASE, AMYLASE in the last 168 hours. No results for input(s): AMMONIA in the last 168 hours. CBC: No results for input(s): WBC, NEUTROABS, HGB, HCT, MCV, PLT in the last 168 hours. Cardiac Enzymes: No results for input(s): CKTOTAL, CKMB, CKMBINDEX, TROPONINI in the last 168 hours. CBG:  Recent Labs Lab 05/31/14 [place]  95   Iron Studies: No results for input(s): IRON, TIBC, TRANSFERRIN, FERRITIN in the last 72 hours. Studies/Results: No results found.  ROS: As per HPI otherwise negative.  Physical Exam: Filed Vitals:   05/31/14 1439  BP: 173/88  Pulse: 90  Temp: 98.7 F (37.1 C)  TempSrc: Oral  Resp: 18  Height: 5\' 11"  (1.803 m)  Weight: 93.5 kg (206 lb 2.1 oz)  SpO2: 100%     General: Well developed, talkative in no acute distress. Head: Normocephalic, atraumatic, sclera non-icteric, mucus membranes are moist Neck: Supple. JVD not elevated. Lungs: Clear bilaterally to auscultation without wheezes, rales, or rhonchi. Breathing is unlabored. Heart: RRR with S1 S2. No murmurs, rubs, or gallops appreciated. Abdomen: Soft, non-tender, non-distended with normoactive bowel sounds. No rebound/guarding. M-S:  Strength and tone appear normal for age. Lower extremities: bilateral LE edema 2+ left knee wound wound vac in place L knee Neuro: Alert and oriented X 3. Moves all extremities spontaneously. Psych:  Responds to questions appropriately with a normal affect. Dialysis Access:left upper AVF + bruit  Dialysis Orders: Center:[facility] - Nocturnal EDW 92 - just lowered from 97!!- had been leaving below EDW even with  missing HD treatments.MWF 8 hours 300/500 2K 2 Ca left upper AVF heparin 4000 with 5 K mid tmt Mircera 225 q two weeks- ordered to start 5/11 but none given, venofer 50/week- given 5/18 calcitriol  2 Recent labs: Hgb 9.6 5/13   Assessment/Plan: 1.  Recurrent wound dehiscence of LTKR 3/30 wound following tramatic fall.- planned exploration of wound with replacement of beads and then IV antibiotics 2.  ESRD -  MWF nocturnal - will run at 4 hours at higher BFR/DFR; rarely running full 8 hours - usually slightly over 7 and only ran 3 hr 40 min on Wed- need to hold heparin- plan HD on Saturday 3.  Hypertension/volume  - EDW just lowered 5 kg had been leaving below edw - pre HD weight 94.3 with post wt 93.9 on Wed 4.  Anemia  - follow CBC Hgb 9.6 5/13 - last Mircera was 150 on 4/27 - had 225 ordered for 5/11 - but not given- likely secondary to missed HD treatments and Mircera not rescheduled 5.  Metabolic bone disease -  sensipar 30 and phoslo 1 ac, iPTH 168 05/2013 6.  Nutrition - renal diet + vitamin; having progressive weight loss 7.  DM -  8. Dialysis noncompliance - needs to stay on full treatments AND not skip treatments; he is considering PD, but this would be contraindicated until infection has resolved and attendance is good.  Myriam Jacobson, PA-C [place] [phone] 05/31/2014, 3:06 PM   Pt seen, examined, agree w assess/plan as above with additions as indicated.  Kelly Splinter MD pager [phone]    cell [phone] 06/01/2014, 9:37 AM

## 2014-05-31 NOTE — H&P (Signed)
Matthew Kline is an 54 y.o. male.   Chief Complaint: Dehiscence irrigation and debridement of total knee arthroplasty incision. HPI: Patient is a 54 year old gentleman who is status post total knee arthroplasty when he fell at home sustaining a complete dehiscence of the surgical incision. Patient was brought to the operating room urgently and underwent irrigation debridement and placement of antibiotic beads and wound closure. Patient did not have any communication with the joint with the surgical incision. Patient presents at this time with progressive dehiscence of the surgical incision.  Past Medical History  Diagnosis Date  . Polymyalgia rheumatica   . Hypertension   . Diabetic neuropathy   . Osteomyelitis of foot, left, acute   . Anxiety   . Insomnia, unspecified   . Unspecified vitamin D deficiency   . Anemia, unspecified   . Other chronic postoperative pain   . Unspecified hereditary and idiopathic peripheral neuropathy   . Allergy   . Unspecified osteomyelitis, site unspecified   . Long term (current) use of anticoagulants   . 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 Ulcer     diabetic foot   . Arthralgia 2010    polyarticular  . Hemorrhoids, internal 10/2011    small  . CHF (congestive heart failure)   . CHF (congestive heart failure) 07/25/2009    denies  . Hemodialysis access site with mature fistula   . Myocardial infarction 1995  . Pneumonia     "probably 4-5 times" (05/09/2014)  . Sleep apnea     "lost weight; no more problem" (05/09/2014)  . Type II diabetes mellitus dx'd 1995  . History of blood transfusion     "related to the anemia"  . GERD (gastroesophageal reflux disease)     hx "before I lost weight"  . ESRD (end stage renal disease) on dialysis     started 12/2012; "MWF; [employer] [employer]"  . Stroke 01/10/06    denies residual on 05/09/2014  . Arthritis     "back, knees" (05/09/2014)  . Chronic lower back pain     Past Surgical History   Procedure Laterality Date  . Amputation  01/21/2012    Procedure: AMPUTATION RAY;  Surgeon: [name], MD;  Location: [place];  Service: Orthopedics;  Laterality: Left;  Left Foot 4th Ray Amputation  . Anterior cervical decomp/discectomy fusion  02/2011  . Knee arthroscopy Left 08-25-2012  . Toe amputation Bilateral     "I've lost 7 toes over the last 7 years" (05/09/2014)  . Refractive surgery Bilateral   . Bascilic vein transposition Left 10/19/2012    Procedure: BASCILIC VEIN TRANSPOSITION;  Surgeon: Serafina Mitchell, MD;  Location: [place];  Service: Vascular;  Laterality: Left;  . Tonsillectomy    . Amputation Left 05/04/2013    Procedure: AMPUTATION DIGIT;  Surgeon: [name], MD;  Location: [place];  Service: Orthopedics;  Laterality: Left;  Left Great Toe Amputation at MTP  . Toe surgery Left April 2015    Big toe removed on left foot.  . Total knee arthroplasty Left 04/10/2014    Procedure: TOTAL KNEE ARTHROPLASTY;  Surgeon: [name], MD;  Location: [place];  Service: Orthopedics;  Laterality: Left;  . Wound debridement Left 05/09/2014    Dehiscence Left Total Knee Arthroplasty Incision  . Back surgery    . Uvulopalatopharyngoplasty, tonsillectomy and septoplasty  ~ 1989  . I&d extremity Left 05/09/2014    Procedure: Irrigation and Debridement Left Knee and Closure of  Total Knee Arthroplasty Incision;  Surgeon: [name], MD;  Location: [place];  Service: Orthopedics;  Laterality: Left;    Family History  Problem Relation Age of Onset  . Hypertension Mother   . Cancer Mother 79    Ovarian  . Heart disease Maternal Aunt   . Stroke Maternal Grandfather    Social History:  reports that he has been smoking Cigarettes.  He has a 3.84 pack-year smoking history. He has never used smokeless tobacco. He reports that he does not drink alcohol or use illicit drugs.  Allergies:  Allergies  Allergen Reactions  . Morphine And Related     hallucinations  . Tygacil [Tigecycline]  Nausea And Vomiting, Nausea Only and Other (See Comments)    No prescriptions prior to admission    No results found for this or any previous visit (from the past 48 hour(s)). No results found.  Review of Systems  All other systems reviewed and are negative.   There were no vitals taken for this visit. Physical Exam  On examination patient is manipulating the wound with his hands. He has progressive dehiscence of the wound. There is clear drainage. Patient's knee is asymptomatic but there is a concern for the surgical wound communicating with the joint at this time. Assessment/Plan Assessment: Dehiscence of irrigation and debridement of wound revision after traumatic fall.  Plan: We'll plan for repeat irrigation and debridement. We will determine if the joint actually communicates with the traumatic wound. If there is communication we will need to replace the polyethylene tray in place antibiotic beads deep within the joint. We will plan for a wound VAC postoperatively.  Malaya Cagley V 05/31/2014, 6:51 AM

## 2014-05-31 NOTE — Transfer of Care (Signed)
Immediate Anesthesia Transfer of Care Note  Patient: Matthew Kline  Procedure(s) Performed: Procedure(s): IRRIGATION AND DEBRIDEMENT LEFT KNEE, PLACE ANTIBIOTIC BEADS,  POLY EXCHANGE (Left)  Patient Location: PACU  Anesthesia Type:General  Level of Consciousness: awake, alert  and oriented  Airway & Oxygen Therapy: Patient Spontanous Breathing and Patient connected to nasal cannula oxygen  Post-op Assessment: Report given to RN and Post -op Vital signs reviewed and stable  Post vital signs: Reviewed and stable  Last Vitals:  Filed Vitals:   05/31/14 1439  BP: 173/88  Pulse: 90  Temp: 37.1 C  Resp: 18    Complications: No apparent anesthesia complications

## 2014-05-31 NOTE — Op Note (Signed)
05/31/2014  5:55 PM  PATIENT:  Matthew Kline    PRE-OPERATIVE DIAGNOSIS:  wound dehiscence left total knee arthroplasty  POST-OPERATIVE DIAGNOSIS:  Same  PROCEDURE: Excisional DEBRIDEMENT LEFT KNEE with removal of skin soft tissue and muscle with sharp debridement, PLACE ANTIBIOTIC BEADS,   POLY EXCHANGE revision of tibial component.  Placement of incisional wound VAC.  Placement of antibiotic beads.  SURGEON:  [name], MD  PHYSICIAN ASSISTANT:None ANESTHESIA:   General  PREOPERATIVE INDICATIONS:  Matthew Kline is a  54 y.o. male with a diagnosis of wound dehiscence left total knee arthroplasty who failed conservative measures and elected for surgical management.    The risks benefits and alternatives were discussed with the patient preoperatively including but not limited to the risks of infection, bleeding, nerve injury, cardiopulmonary complications, the need for revision surgery, among others, and the patient was willing to proceed.  OPERATIVE IMPLANTS: Tibial tray. Antibiotic beads with 1 g vancomycin  and 240 mg gentamicin  OPERATIVE FINDINGS: Traumatic wound extended down into the joint.  OPERATIVE PROCEDURE: Patient is a 54 year old gentleman who was 2 weeks status post total knee arthroplasty when he fell and sustained a superficial dehiscence of the surgical incision. Patient initially underwent irrigation debridement placement of antibiotic beads and wound closure. Patient subsequently developed increased drainage he was manipulating wound with his hand and was brought at this time for surgical intervention. Evaluation this time shows a dehiscence of the retinaculum with exposed joint.  Patient was brought to the operating room and underwent a general anesthetic. After adequate levels of anesthesia were obtained patient's left lower extremity was prepped using DuraPrep and draped into a sterile field. A timeout was called. The surgical incision was opened and  necrotic skin and hyper granulation tissue was removed. Cultures were obtained. Antibiotics were provided after cultures. Examination showed a rupture through the retinaculum with exposed joint. The retinacular incision was extended the muscle capsule and skin was  excised sharply with a 21 blade knife. The polyethylene tray was removed. The knee was irrigated with pulsatile lavage with 6 L. All tissue was bleeding and had good granulation tissue no ischemic or necrotic tissue. There is no signs of an abscess. The new polyethylene tray was placed and secured in place with a screw. The antibiotic beads were then packed into the joint. The retinaculum was closed using #1 Vicryl skin was closed using 2-0 nylon and a incisional wound VAC was applied this had a good suction fit. Patient was extubated taken to the PACU in stable condition.

## 2014-05-31 NOTE — Anesthesia Procedure Notes (Signed)
Procedure Name: LMA Insertion Date/Time: 05/31/2014 4:48 PM Performed by: Susa Loffler Pre-anesthesia Checklist: Patient identified, Timeout performed, Emergency Drugs available, Suction available and Patient being monitored Patient Re-evaluated:Patient Re-evaluated prior to inductionOxygen Delivery Method: Circle system utilized Preoxygenation: Pre-oxygenation with 100% oxygen Intubation Type: IV induction LMA: LMA inserted LMA Size: 5.0 Number of attempts: 1 Placement Confirmation: positive ETCO2 and breath sounds checked- equal and bilateral Tube secured with: Tape Dental Injury: Teeth and Oropharynx as per pre-operative assessment

## 2014-05-31 NOTE — Anesthesia Postprocedure Evaluation (Signed)
  Anesthesia Post-op Note  Patient: Matthew Kline  Procedure(s) Performed: Procedure(s): IRRIGATION AND DEBRIDEMENT LEFT KNEE, PLACE ANTIBIOTIC BEADS,  POLY EXCHANGE (Left)  Patient Location: PACU  Anesthesia Type:General  Level of Consciousness: awake  Airway and Oxygen Therapy: Patient Spontanous Breathing  Post-op Pain: mild  Post-op Assessment: Post-op Vital signs reviewed  Post-op Vital Signs: Reviewed  Last Vitals:  Filed Vitals:   05/31/14 1802  BP:   Pulse:   Temp: 36.4 C  Resp:     Complications: No apparent anesthesia complications

## 2014-05-31 NOTE — Anesthesia Preprocedure Evaluation (Addendum)
Anesthesia Evaluation  Patient identified by MRN, date of birth, ID band Patient awake    Reviewed: Allergy & Precautions, H&P , NPO status , Patient's Chart, lab work & pertinent test results, reviewed documented beta blocker date and time   Airway Mallampati: II   Neck ROM: full    Dental  (+) Dental Advisory Given, Poor Dentition   Pulmonary neg pulmonary ROS, sleep apnea , pneumonia -, Current Smoker,  breath sounds clear to auscultation        Cardiovascular hypertension, + CAD, + Past MI, + Peripheral Vascular Disease and +CHF Rhythm:Regular     Neuro/Psych PSYCHIATRIC DISORDERS Anxiety Depression  Neuromuscular disease CVA, No Residual Symptoms    GI/Hepatic GERD-  Medicated and Controlled,  Endo/Other  diabetes, Type 2, Oral Hypoglycemic Agentsobese  Renal/GU ESRFRenal disease     Musculoskeletal  (+) Arthritis -,   Abdominal (+)  Abdomen: soft. Bowel sounds: normal.  Peds  Hematology  (+) anemia ,   Anesthesia Other Findings   Reproductive/Obstetrics                           Anesthesia Physical  Anesthesia Plan  ASA: III  Anesthesia Plan: General   Post-op Pain Management:    Induction:   Airway Management Planned: LMA and Oral ETT  Additional Equipment:   Intra-op Plan:   Post-operative Plan: Extubation in OR  Informed Consent: I have reviewed the patients History and Physical, chart, labs and discussed the procedure including the risks, benefits and alternatives for the proposed anesthesia with the patient or authorized representative who has indicated his/her understanding and acceptance.   Dental advisory given  Plan Discussed with: CRNA, Anesthesiologist and Surgeon  Anesthesia Plan Comments:        Anesthesia Quick Evaluation

## 2014-05-31 NOTE — Progress Notes (Addendum)
ANTICOAGULATION + ANTIMICROBIAL CONSULT NOTE - Initial Consult  Pharmacy Consult for warfarin Indication: VTE prophylaxis  Allergies  Allergen Reactions  . Morphine And Related     hallucinations  . Tygacil [Tigecycline] Nausea And Vomiting, Nausea Only and Other (See Comments)    Patient Measurements: Height: 5\' 11"  (180.3 cm) Weight: 206 lb 2.1 oz (93.5 kg) IBW/kg (Calculated) : 75.3 Heparin Dosing Weight:   Vital Signs: Temp: 97.8 F (36.6 C) (05/20 1927) Temp Source: Oral (05/20 1439) BP: 112/71 mmHg (05/20 1927) Pulse Rate: 85 (05/20 1927)  Labs:  Recent Labs  05/31/14 1510  HGB 9.2*  HCT 29.2*  PLT 348  APTT 38*  LABPROT 16.0*  INR 1.27  CREATININE 7.28*    Estimated Creatinine Clearance: 13.4 mL/min (by C-G formula based on Cr of 7.28).   Medical History: Past Medical History  Diagnosis Date  . Polymyalgia rheumatica   . Hypertension   . Diabetic neuropathy   . Osteomyelitis of foot, left, acute   . Anxiety   . Insomnia, unspecified   . Unspecified vitamin D deficiency   . Anemia, unspecified   . Other chronic postoperative pain   . Unspecified hereditary and idiopathic peripheral neuropathy   . Allergy   . Unspecified osteomyelitis, site unspecified   . Long term (current) use of anticoagulants   . 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 Ulcer     diabetic foot   . Arthralgia 2010    polyarticular  . Hemorrhoids, internal 10/2011    small  . CHF (congestive heart failure)   . CHF (congestive heart failure) 07/25/2009    denies  . Hemodialysis access site with mature fistula   . Myocardial infarction 1995  . Pneumonia     "probably 4-5 times" (05/09/2014)  . Sleep apnea     "lost weight; no more problem" (05/09/2014)  . Type II diabetes mellitus dx'd 1995  . History of blood transfusion     "related to the anemia"  . GERD (gastroesophageal reflux disease)     hx "before I lost weight"  . ESRD (end stage renal disease) on  dialysis     started 12/2012; "MWF; [employer]"  . Stroke 01/10/06    denies residual on 05/09/2014  . Arthritis     "back, knees" (05/09/2014)  . Chronic lower back pain     Medications:  Prescriptions prior to admission  Medication Sig Dispense Refill Last Dose  . amitriptyline (ELAVIL) 50 MG tablet Take 1 tablet (50 mg total) by mouth at bedtime. 90 tablet 3 05/30/2014 at Unknown time  . amLODipine (NORVASC) 5 MG tablet Take 1 tablet by mouth daily.  6 05/30/2014 at Unknown time  . calcium acetate (PHOSLO) 667 MG capsule Take 667 mg by mouth 3 (three) times daily with meals.   05/30/2014 at Unknown time  . Cholecalciferol (VITAMIN D PO) Take 1 tablet by mouth 3 (three) times a week. At [facility]   05/30/2014 at Unknown time  . glipiZIDE (GLUCOTROL) 5 MG tablet Take 1 tablet (5 mg total) by mouth daily before breakfast. 180 tablet 3 05/30/2014 at Unknown time  . HYDROcodone-acetaminophen (NORCO/VICODIN) 5-325 MG per tablet Take 1 tablet by mouth every 4 (four) hours as needed for moderate pain.   0 Past Week at Unknown time  . ranitidine (ZANTAC) 150 MG tablet Take 150 mg by mouth daily as needed for heartburn.    05/30/2014 at Unknown time  . zolpidem (AMBIEN) 10 MG tablet  Take 1 tablet (10 mg total) by mouth at bedtime as needed for sleep. 90 tablet 1 05/30/2014 at Unknown time  . gabapentin (NEURONTIN) 100 MG capsule Take 2 capsules (200 mg total) by mouth 2 (two) times daily. (Patient taking differently: Take 200 mg by mouth daily. ) 360 capsule 3 05/08/2014 at Unknown time  . sildenafil (VIAGRA) 25 MG tablet Take 2 tablets (50 mg total) by mouth daily as needed for erectile dysfunction. 10 tablet 3 unknown    Assessment: 54 yo male admitted s/p fall after TKA who suffered dehiscence at the surgical incision. Now s/p I&D. Initiating warfarin for VTE px.   Goal of Therapy:  INR 2-3 Monitor platelets by anticoagulation protocol: Yes   Plan:  -Warfarin 7.5 mg po x1 -Daily  INR   Hughes Better, PharmD, BCPS Clinical Pharmacist Pager: [phone] 05/31/2014 7:38 PM    Addendum   Also initiating vancomycin and zosyn. Pt is ESRD on HD. He received vanc 1 g pre-operativelyat 1730.  Plan -Zosyn 2.25 g IV q8h  -Will give vancomycin 750 mg IV x1 to complete load (when combined with pre-op dose) -Vancomycin 1 g IV qHD  -F/u HD schedule, cultures, duration of therapy   Harvel Quale  05/31/2014 8:48 PM

## 2014-06-01 LAB — GLUCOSE, CAPILLARY
GLUCOSE-CAPILLARY: 100 mg/dL — AB (ref 65–99)
GLUCOSE-CAPILLARY: 132 mg/dL — AB (ref 65–99)
GLUCOSE-CAPILLARY: 128 mg/dL — AB (ref 65–99)
GLUCOSE-CAPILLARY: 91 mg/dL (ref 65–99)
Glucose-Capillary: 87 mg/dL (ref 65–99)
Glucose-Capillary: 116 mg/dL — ABNORMAL HIGH (ref 65–99)

## 2014-06-01 LAB — PROTIME-INR
INR: 1.33 (ref 0.00–1.49)
Prothrombin Time: 16.6 seconds — ABNORMAL HIGH (ref 11.6–15.2)

## 2014-06-01 MED ORDER — PRO-STAT SUGAR FREE PO LIQD
30.0000 mL | Freq: Two times a day (BID) | ORAL | Status: DC
  Administered 2014-06-01 – 2014-06-07 (×8): 30 mL via ORAL
  Filled 2014-06-01 (×16): qty 30

## 2014-06-01 MED ORDER — VANCOMYCIN HCL IN DEXTROSE 1-5 GM/200ML-% IV SOLN
1000.0000 mg | Freq: Once | INTRAVENOUS | Status: DC
  Filled 2014-06-01: qty 200

## 2014-06-01 MED ORDER — VANCOMYCIN HCL 1000 MG IV SOLR
1750.0000 mg | Freq: Once | INTRAVENOUS | Status: AC
  Administered 2014-06-01: 1750 mg via INTRAVENOUS
  Filled 2014-06-01 (×2): qty 1750

## 2014-06-01 MED ORDER — WARFARIN VIDEO: Freq: Once | Status: DC

## 2014-06-01 MED ORDER — WARFARIN SODIUM 7.5 MG PO TABS
7.5000 mg | ORAL_TABLET | Freq: Once | ORAL | Status: AC
  Administered 2014-06-01: 7.5 mg via ORAL
  Filled 2014-06-01 (×2): qty 1

## 2014-06-01 MED ORDER — DARBEPOETIN ALFA 150 MCG/0.3ML IJ SOSY
PREFILLED_SYRINGE | INTRAMUSCULAR | Status: AC
Start: 2014-06-01 — End: 2014-06-01
  Administered 2014-06-01: 150 ug via INTRAVENOUS
  Filled 2014-06-01: qty 0.3

## 2014-06-01 MED ORDER — RENA-VITE PO TABS
1.0000 | ORAL_TABLET | Freq: Every day | ORAL | Status: DC
  Administered 2014-06-01 – 2014-06-07 (×7): 1 via ORAL
  Filled 2014-06-01 (×7): qty 1

## 2014-06-01 NOTE — Progress Notes (Signed)
Contacted pharmacy r/t patient in dialysis until 1200 or later, Vancomycin dose due @ 1000. Stated would reschedule.

## 2014-06-01 NOTE — Progress Notes (Signed)
Pt was due to void on arrival to floor (per pt he still voids frequently). Multiple attempts to use urinal and sit EOB or get up to [facility] were unsuccessful. Bladder scan @ 12 am showed 511 ccs. On call Dr. Marlou Sa was notified, I&O cath was done @ 1 am with 550 ccs output. Will continue to monitor.   Raquel James 06/01/2014 2:47 AM

## 2014-06-01 NOTE — Progress Notes (Signed)
PT Cancellation Note  Unable to complete PT eval this AM. Pt out of room in hemodialysis.  Lorrin Goodell, PT  Office # [phone] Pager [phone]

## 2014-06-01 NOTE — Progress Notes (Signed)
PT Cancellation Note  Patient Details Name: Matthew Kline MRN: [id number] DOB: 06-04-1960   Cancelled Treatment:    Reason Eval/Treat Not Completed: Fatigue/lethargy limiting ability to participate. Attempted PT eval this PM. Pt lethargic and unable to stay awake. PT to re-attempt tomorrow.   Lorriane Shire 06/01/2014, 2:37 PM

## 2014-06-01 NOTE — Progress Notes (Addendum)
ANTIBIOTIC CONSULT NOTE - FOLLOW UP  Pharmacy Consult for Vancomycin + Zosyn Indication: Wound infection  Allergies  Allergen Reactions  . Morphine And Related     hallucinations  . Tygacil [Tigecycline] Nausea And Vomiting, Nausea Only and Other (See Comments)    Patient Measurements: Height: 5\' 11"  (180.3 cm) Weight: 212 lb 11.9 oz (96.5 kg) IBW/kg (Calculated) : 75.3   Vital Signs: Temp: 97.7 F (36.5 C) (05/21 0805) Temp Source: Oral (05/21 0805) BP: 102/78 mmHg (05/21 0900) Pulse Rate: 113 (05/21 0900) Intake/Output from previous day: 05/20 0701 - 05/21 0700 In: 1063.8 [P.O.:360; I.V.:453.8; IV Piggyback:250] Out: 850 [Urine:550; Drains:50; Blood:250] Intake/Output from this shift:    Labs:  Recent Labs  05/31/14 1510 05/31/14 2034  WBC 7.4 7.0  HGB 9.2* 8.0*  PLT 348 329  CREATININE 7.28* 7.21*   Estimated Creatinine Clearance: 13.7 mL/min (by C-G formula based on Cr of 7.21). No results for input(s): VANCOTROUGH, VANCOPEAK, VANCORANDOM, GENTTROUGH, GENTPEAK, GENTRANDOM, TOBRATROUGH, TOBRAPEAK, TOBRARND, AMIKACINPEAK, AMIKACINTROU, AMIKACIN in the last 72 hours.   Microbiology: No results found for this or any previous visit (from the past 720 hour(s)).  Anti-infectives    Start     Dose/Rate Route Frequency Ordered Stop   06/01/14 1300  vancomycin (VANCOCIN) IVPB 1000 mg/200 mL premix  Status:  Discontinued     1,000 mg 200 mL/hr over 60 Minutes Intravenous  Once 06/01/14 0824 06/01/14 0929   06/01/14 1300  vancomycin (VANCOCIN) 1,750 mg in sodium chloride 0.9 % 250 mL IVPB     1,750 mg 250 mL/hr over 60 Minutes Intravenous  Once 06/01/14 0929     06/01/14 1000  vancomycin (VANCOCIN) IVPB 1000 mg/200 mL premix  Status:  Discontinued     1,000 mg 200 mL/hr over 60 Minutes Intravenous  Once 06/01/14 0809 06/01/14 0824   05/31/14 2200  piperacillin-tazobactam (ZOSYN) IVPB 2.25 g     2.25 g 100 mL/hr over 30 Minutes Intravenous 3 times per day 05/31/14  2029     05/31/14 2130  vancomycin (VANCOCIN) IVPB 750 mg/150 ml premix     750 mg 150 mL/hr over 60 Minutes Intravenous  Once 05/31/14 2048 06/01/14 0002   05/31/14 1945  vancomycin (VANCOCIN) IVPB 1000 mg/200 mL premix  Status:  Discontinued     1,000 mg 200 mL/hr over 60 Minutes Intravenous Every 12 hours 05/31/14 1932 05/31/14 2027   05/31/14 1945  piperacillin-tazobactam (ZOSYN) IVPB 3.375 g  Status:  Discontinued     3.375 g 12.5 mL/hr over 240 Minutes Intravenous 4 times per day 05/31/14 1932 05/31/14 2029   05/31/14 1713  gentamicin (GARAMYCIN) injection  Status:  Discontinued       As needed 05/31/14 1713 05/31/14 1800   05/31/14 1713  vancomycin (VANCOCIN) powder  Status:  Discontinued       As needed 05/31/14 1714 05/31/14 1800      Assessment: 54yo male w/ ESRD on HD admitted s/p fall after TKA who suffered dehiscence at the surgical incision.  Now s/p I&D.  Pharmacy consulted to dose Vanc and Zosyn for wound infection.  Pt did not receive full loading dose of vancomycin 5/20.  Will need to give higher dose post-HD today as a result.  Goal of Therapy:  Pre-HD Vancomycin level 15-25 mcg/ml  Plan:  -Vancomycin 1750mg  IV post-HD today -Then Vancomycin 1g IV qHD -Zosyn 2.25g IV Q8h -Monitor C&S, duration of therapy, HD schedule, vanc level at steady state  Drucie Opitz, PharmD Clinical Pharmacy  Resident Pager: [phone] 06/01/2014 9:38 AM

## 2014-06-01 NOTE — Progress Notes (Signed)
[facility] Progress Note  Assessment/Plan: 1. 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 Sharol Given; IV Vanc and Zosyn started 2. ESRD - MWF nocturnal - will run at 4 hours at higher BFR/DFR; rarely running full 8 hours - usually slightly over 7 and only ran 3 hr 40 min on Wed- need to hold heparin- HD today and back on schedule Monday. K 5 3. Hypertension/volume - EDW just lowered 5 kg had been leaving below edw - pre HD weight 94.3 with post wt 93.9 on Wed. Pre HD weigh 5/21 96.5 - I think goal of 3 L is reasonable 4. Anemia - follow CBC Hgb 9.6 5/13  Down to 8 post op- last Mircera was 150 on 4/27 - had 225 ordered for 5/11 - but not given- likely secondary to missed HD treatments and Mircera not rescheduled; Aranesp 150 given 5/21 5. Metabolic bone disease - sensipar 30 and phoslo 1 ac, iPTH 168 05/2013 - corr Ca 10.5 - use 2 Ca bath- continue calitriol for now - watch Ca- d/c cholecalciferol 6. Nutrition - renal diet + vitamin; having progressive weight loss alb 2.1 - renal carb mod diet - add prostat 7. DM - BS ok 8. Dialysis noncompliance - needs to stay on full treatments AND not skip treatments; he is considering PD, but this would be contraindicated until infection has resolved and attendance is good.- we discussed this  9. VTE prophylaxis - started on coumadin  Myriam Jacobson, PA-C [facility] Beeper [phone] 06/01/2014,8:59 AM  LOS: 1 day   Pt seen, examined and agree w A/P as above.  Kelly Splinter MD pager [phone]    cell [phone] 06/01/2014, 9:36 AM    Subjective:   Doesn't think he can tolerate a goal of 3 L; doesn't remember his knee hurting this much last time  Objective Filed Vitals:   06/01/14 0432 06/01/14 0805 06/01/14 0810 06/01/14 0830  BP: 134/72 155/82 154/81 118/70  Pulse: 112 113 113 112  Temp: 99.7 F (37.6 C)  97.7 F (36.5 C)    TempSrc: Oral Oral    Resp: 20 18    Height:      Weight:  96.5 kg (212 lb 11.9 oz)    SpO2: 94% 95%     Physical Exam General:NAD - nodding off during conversation Heart: tachy reg Lungs: grossly clear Abdomen: soft NT Extremities: tr LE edema Dialysis Access:  Left upper AVF Qb 350   Dialysis Orders: Center:[facility] - Nocturnal EDW 92 - just lowered from 97!!- had been leaving below EDW even with missing HD treatments.MWF 8 hours 300/500 2K 2 Ca left upper AVF heparin 4000 with 5 K mid tmt Mircera 225 q two weeks- ordered to start 5/11 but none given, venofer 50/week- given 5/18 calcitriol 2 Recent labs: Hgb 9.6 5/13   Additional Objective Labs: Lab Results  Component Value Date   INR 1.33 06/01/2014   INR 1.27 05/31/2014   INR 1.01 123XX123    Basic Metabolic Panel:  Recent Labs Lab 05/31/14 1510 05/31/14 2034  NA 139 139  K 4.5 5.0  CL 104 106  CO2 21* 20*  GLUCOSE 104* 91  BUN 40* 41*  CREATININE 7.28* 7.21*  CALCIUM 9.4 8.8*  PHOS  --  5.9*   Liver Function Tests:  Recent Labs Lab 05/31/14 1510 05/31/14 2034  AST 17  --   ALT 15*  --  ALKPHOS 87  --   BILITOT 1.0  --   PROT 7.8  --   ALBUMIN 2.6* 2.1*   CBC:  Recent Labs Lab 05/31/14 1510 05/31/14 2034  WBC 7.4 7.0  HGB 9.2* 8.0*  HCT 29.2* 26.2*  MCV 85.9 87.3  PLT 348 329  CBG:  Recent Labs Lab 05/31/14 1445 05/31/14 1818 05/31/14 2111 06/01/14 0625 06/01/14 0809  GLUCAP 95 93 87 100* 132*  Medications: . sodium chloride 10 mL/hr at 05/31/14 1940   . amitriptyline  50 mg Oral QHS  . amLODipine  5 mg Oral Daily  . calcitRIOL  2 mcg Oral Q Sat-HD  . [START ON 06/03/2014] calcitRIOL  2 mcg Oral Q M,W,F-HD  . calcium acetate  667 mg Oral TID WC  . [START ON 06/03/2014] cholecalciferol  1,000 Units Oral Once per day on Mon Wed Fri  . darbepoetin (ARANESP) injection - DIALYSIS  150 mcg Intravenous Q Sat-HD  . famotidine  10 mg Oral Daily  . gabapentin  200 mg  Oral BID  . glipiZIDE  5 mg Oral QAC breakfast  . insulin aspart  0-9 Units Subcutaneous TID WC  . piperacillin-tazobactam (ZOSYN)  IV  2.25 g Intravenous 3 times per day  . vancomycin  1,000 mg Intravenous Once  . warfarin  7.5 mg Oral ONCE-1800  . warfarin   Does not apply Once  . Warfarin - Pharmacist Dosing Inpatient   Does not apply [phone]

## 2014-06-01 NOTE — Progress Notes (Addendum)
ANTICOAGULATION CONSULT NOTE - [place] for warfarin Indication: VTE prophylaxis  Allergies  Allergen Reactions  . Morphine And Related     hallucinations  . Tygacil [Tigecycline] Nausea And Vomiting, Nausea Only and Other (See Comments)    Patient Measurements: Height: 5\' 11"  (180.3 cm) Weight: 212 lb 11.9 oz (96.5 kg) IBW/kg (Calculated) : 75.3   Vital Signs: Temp: 97.7 F (36.5 C) (05/21 0805) Temp Source: Oral (05/21 0805) BP: 118/70 mmHg (05/21 0830) Pulse Rate: 112 (05/21 0830)  Labs:  Recent Labs  05/31/14 1510 05/31/14 2034 06/01/14 0410  HGB 9.2* 8.0*  --   HCT 29.2* 26.2*  --   PLT 348 329  --   APTT 38*  --   --   LABPROT 16.0*  --  16.6*  INR 1.27  --  1.33  CREATININE 7.28* 7.21*  --     Estimated Creatinine Clearance: 13.7 mL/min (by C-G formula based on Cr of 7.21).   Medical History: Past Medical History  Diagnosis Date  . Polymyalgia rheumatica   . Hypertension   . Diabetic neuropathy   . Osteomyelitis of foot, left, acute   . Anxiety   . Insomnia, unspecified   . Unspecified vitamin D deficiency   . Anemia, unspecified   . Other chronic postoperative pain   . Unspecified hereditary and idiopathic peripheral neuropathy   . Allergy   . Unspecified osteomyelitis, site unspecified   . Long term (current) use of anticoagulants   . 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 Ulcer     diabetic foot   . Arthralgia 2010    polyarticular  . Hemorrhoids, internal 10/2011    small  . CHF (congestive heart failure)   . CHF (congestive heart failure) 07/25/2009    denies  . Hemodialysis access site with mature fistula   . Myocardial infarction 1995  . Pneumonia     "probably 4-5 times" (05/09/2014)  . Sleep apnea     "lost weight; no more problem" (05/09/2014)  . Type II diabetes mellitus dx'd 1995  . History of blood transfusion     "related to the anemia"  . GERD (gastroesophageal reflux disease)     hx  "before I lost weight"  . ESRD (end stage renal disease) on dialysis     started 12/2012; "MWF; [employer] [employer]"  . Stroke 01/10/06    denies residual on 05/09/2014  . Arthritis     "back, knees" (05/09/2014)  . Chronic lower back pain     Medications:  Prescriptions prior to admission  Medication Sig Dispense Refill Last Dose  . amitriptyline (ELAVIL) 50 MG tablet Take 1 tablet (50 mg total) by mouth at bedtime. 90 tablet 3 05/30/2014 at Unknown time  . amLODipine (NORVASC) 5 MG tablet Take 1 tablet by mouth daily.  6 05/30/2014 at Unknown time  . calcium acetate (PHOSLO) 667 MG capsule Take 667 mg by mouth 3 (three) times daily with meals.   05/30/2014 at Unknown time  . Cholecalciferol (VITAMIN D PO) Take 1 tablet by mouth 3 (three) times a week. At [facility]   05/30/2014 at Unknown time  . glipiZIDE (GLUCOTROL) 5 MG tablet Take 1 tablet (5 mg total) by mouth daily before breakfast. 180 tablet 3 05/30/2014 at Unknown time  . HYDROcodone-acetaminophen (NORCO/VICODIN) 5-325 MG per tablet Take 1 tablet by mouth every 4 (four) hours as needed for moderate pain.   0 Past Week at Unknown time  .  ranitidine (ZANTAC) 150 MG tablet Take 150 mg by mouth daily as needed for heartburn.    05/30/2014 at Unknown time  . zolpidem (AMBIEN) 10 MG tablet Take 1 tablet (10 mg total) by mouth at bedtime as needed for sleep. 90 tablet 1 05/30/2014 at Unknown time  . gabapentin (NEURONTIN) 100 MG capsule Take 2 capsules (200 mg total) by mouth 2 (two) times daily. (Patient taking differently: Take 200 mg by mouth daily. ) 360 capsule 3 05/08/2014 at Unknown time  . sildenafil (VIAGRA) 25 MG tablet Take 2 tablets (50 mg total) by mouth daily as needed for erectile dysfunction. 10 tablet 3 unknown    Assessment: 54yo male w/ ESRD on HD admitted s/p fall after TKA who suffered dehiscence at the surgical incision. Now s/p I&D 5/20. Now on warfarin for VTE px - first dose 5/20. INR subtherapeutic at 1.33. Pt w/ anemia -  scheduled to get Aranesp today w/ HD.  H&H 8/26.2, Plt wnl, no bleeding noted.  Goal of Therapy:  INR 2-3 Monitor platelets by anticoagulation protocol: Yes   Plan:  -Warfarin 7.5 mg po x1 -Daily INR, CBC, s/sx bleeding  Drucie Opitz, PharmD Clinical Pharmacy Resident Pager: [phone] 06/01/2014 8:51 AM

## 2014-06-02 LAB — CBC
HCT: 22.9 % — ABNORMAL LOW (ref 39.0–52.0)
HEMOGLOBIN: 7.2 g/dL — AB (ref 13.0–17.0)
MCH: 27.3 pg (ref 26.0–34.0)
MCHC: 31.4 g/dL (ref 30.0–36.0)
MCV: 86.7 fL (ref 78.0–100.0)
Platelets: 288 10*3/uL (ref 150–400)
RBC: 2.64 MIL/uL — ABNORMAL LOW (ref 4.22–5.81)
RDW: 15.8 % — ABNORMAL HIGH (ref 11.5–15.5)
WBC: 8.4 10*3/uL (ref 4.0–10.5)

## 2014-06-02 LAB — PROTIME-INR
INR: 1.91 — ABNORMAL HIGH (ref 0.00–1.49)
Prothrombin Time: 21.8 seconds — ABNORMAL HIGH (ref 11.6–15.2)

## 2014-06-02 LAB — GLUCOSE, CAPILLARY
GLUCOSE-CAPILLARY: 80 mg/dL (ref 65–99)
GLUCOSE-CAPILLARY: 97 mg/dL (ref 65–99)
GLUCOSE-CAPILLARY: 94 mg/dL (ref 65–99)
Glucose-Capillary: 106 mg/dL — ABNORMAL HIGH (ref 65–99)

## 2014-06-02 MED ORDER — CALCIUM ACETATE (PHOS BINDER) 667 MG PO CAPS
1334.0000 mg | ORAL_CAPSULE | Freq: Three times a day (TID) | ORAL | Status: DC
  Administered 2014-06-02 – 2014-06-07 (×11): 1334 mg via ORAL
  Filled 2014-06-02 (×13): qty 2

## 2014-06-02 MED ORDER — WARFARIN SODIUM 5 MG PO TABS
5.0000 mg | ORAL_TABLET | Freq: Once | ORAL | Status: AC
  Administered 2014-06-02: 5 mg via ORAL
  Filled 2014-06-02: qty 1

## 2014-06-02 MED ORDER — GABAPENTIN 100 MG PO CAPS
200.0000 mg | ORAL_CAPSULE | Freq: Every day | ORAL | Status: DC
  Administered 2014-06-03 – 2014-06-07 (×5): 200 mg via ORAL
  Filled 2014-06-02 (×5): qty 2

## 2014-06-02 MED ORDER — VANCOMYCIN HCL IN DEXTROSE 1-5 GM/200ML-% IV SOLN
1000.0000 mg | INTRAVENOUS | Status: DC
  Filled 2014-06-02 (×2): qty 200

## 2014-06-02 NOTE — Progress Notes (Signed)
Patient stable having more pain with this surgery than his total knee replacement Vital signs stable Wound VAC in place Cultures pending Plan discharge early this week will receive antibiotics and dialysis also

## 2014-06-02 NOTE — Evaluation (Signed)
Physical Therapy Evaluation Patient Details Name: Matthew Kline MRN: [id number] DOB: 1960/11/01 Today's Date: 06/02/2014   History of Present Illness  Patient is a 54 year old gentleman who is status post total knee arthroplasty when he fell at home sustaining a complete dehiscence of the surgical incision. Patient was brought to the operating room urgently and underwent irrigation debridement and placement of antibiotic beads and wound closure 05-09-14. Patient did not have any communication with the joint with the surgical incision. Patient presents at this time with progressive dehiscence of the surgical incision. Additional I&D performed 05-31-14. PMH consists of HTN, CVA (2007), MI, DM, multi toe amputations, and ESRD on HD MWF.  Clinical Impression  Pt admitted with above diagnosis. Pt currently with functional limitations due to the deficits listed below (see PT Problem List). Mobility limited on eval by severe pain. Min assist needed for bed mobility, transfers and gait. Pt ambulated 3 feet with RW bed to recliner. Pt will benefit from skilled PT to increase their independence and safety with mobility to allow discharge to the venue listed below.  Recommending HHPT at d/c. Recommend return to OPPT when deemed appropriate by Dr. Sharol Given.     Follow Up Recommendations Home health PT;Supervision/Assistance - 24 hour    Equipment Recommendations  None recommended by PT    Recommendations for Other Services       Precautions / Restrictions Precautions Precautions: Fall;Knee;Other (comment) (wound vac) Restrictions LLE Weight Bearing: Weight bearing as tolerated      Mobility  Bed Mobility Overal bed mobility: Needs Assistance Bed Mobility: Supine to Sit     Supine to sit: Min assist     General bed mobility comments: assist with LLE  Transfers Overall transfer level: Needs assistance Equipment used: Rolling walker (2 wheeled) Transfers: Sit to/from Stand Sit to Stand: Min  assist            Ambulation/Gait Ambulation/Gait assistance: Min assist Ambulation Distance (Feet): 3 Feet Assistive device: Rolling walker (2 wheeled) Gait Pattern/deviations: Antalgic;Step-to pattern;Decreased weight shift to left Gait velocity: Pain limiting WB on LLE and gait distance. Gait velocity interpretation: Below normal speed for age/gender    Stairs            Wheelchair Mobility    Modified Rankin (Stroke Patients Only)       Balance                                             Pertinent Vitals/Pain Pain Assessment: 0-10 Pain Score: 10-Worst pain ever Pain Location: L knee Pain Descriptors / Indicators: Sharp;Stabbing Pain Intervention(s): RN gave pain meds during session;Limited activity within patient's tolerance;Repositioned    Home Living Family/patient expects to be discharged to:: Private residence Living Arrangements: Spouse/significant other;Children;Other relatives Available Help at Discharge: Family Type of Home: House Home Access: Stairs to enter Entrance Stairs-Rails: None Entrance Stairs-Number of Steps: 2 Home Layout: One level Home Equipment: [place] - 4 wheels;Cane - single point      Prior Function Level of Independence: Independent         Comments: Had returned to driving following his TKA; drives self to HD     Hand Dominance        Extremity/Trunk Assessment   Upper Extremity Assessment: Overall WFL for tasks assessed           Lower Extremity Assessment: LLE deficits/detail  Cervical / Trunk Assessment: Normal  Communication   Communication: No difficulties  Cognition Arousal/Alertness: Awake/alert Behavior During Therapy: WFL for tasks assessed/performed Overall Cognitive Status: Within Functional Limits for tasks assessed                      General Comments      Exercises        Assessment/Plan    PT Assessment Patient needs continued PT services  PT  Diagnosis Abnormality of gait;Acute pain   PT Problem List Decreased strength;Decreased range of motion;Decreased activity tolerance;Decreased balance;Decreased mobility;Pain  PT Treatment Interventions DME instruction;Gait training;Stair training;Functional mobility training;Therapeutic activities;Therapeutic exercise;Patient/family education;Balance training   PT Goals (Current goals can be found in the Care Plan section) Acute Rehab PT Goals Patient Stated Goal: independence PT Goal Formulation: With patient Time For Goal Achievement: 06/09/14 Potential to Achieve Goals: Good    Frequency Min 6X/week   Barriers to discharge        Co-evaluation               End of Session Equipment Utilized During Treatment: Gait belt Activity Tolerance: Patient limited by pain Patient left: in chair;with call bell/phone within reach;with nursing/sitter in room Nurse Communication: Mobility status         Time: 0910-0929 PT Time Calculation (min) (ACUTE ONLY): 19 min   Charges:   PT Evaluation $Initial PT Evaluation Tier I: 1 Procedure     PT G CodesLorriane Shire 06/02/2014, 10:26 AM

## 2014-06-02 NOTE — Progress Notes (Signed)
[place] NOTE  Pharmacy Consult for :  Vanc, Zosyn ;  Coumadin Indication:  Enterococcal wound infection  ;  VTE prophylaxis  Hospital Problems Active Problems:   Infection of total knee replacement  CURRENTLY: Dosing Weight: 94 kg   LABS:  Recent Labs Lab 05/31/14 1510 05/31/14 2034 06/01/14 0410 06/02/14 0439  NA 139 139  --   --   K 4.5 5.0  --   --   CL 104 106  --   --   CO2 21* 20*  --   --   GLUCOSE 104* 91  --   --   BUN 40* 41*  --   --   CREATININE 7.28* 7.21*  --   --   CALCIUM 9.4 8.8*  --   --   PHOS  --  5.9*  --   --   HGB 9.2* 8.0*  --  7.2*  HCT 29.2* 26.2*  --  22.9*  MCV 85.9 87.3  --  86.7  PLT 348 329  --  288  INR 1.27  --  1.33 1.91*    CrCl: Estimated Creatinine Clearance: 13.6 mL/min (by C-G formula based on Cr of 7.21).  MICROBIOLOGY: Lab Results  Component Value Date   CULT  05/31/2014    NO ANAEROBES ISOLATED; CULTURE IN PROGRESS FOR 5 DAYS Performed at [place]  05/31/2014    CULTURE IN PROGRESS FOR FOUR WEEKS Performed at [facility]    CULT  05/31/2014    MODERATE ENTEROCOCCUS SPECIES Performed at [place]  05/31/2014    CULTURE WILL BE EXAMINED FOR 6 WEEKS BEFORE ISSUING A FINAL REPORT Performed at [place] 01/03/2012    Recent Labs Lab 05/31/14 1714  CULT MODERATE ENTEROCOCCUS SPECIESPerformed [facility] [place] [facility] [place] 6 WEEKS BEFORE ISSUING A FINAL REPORTPerformed at [facility]  NO ANAEROBES ISOLATED; CULTURE IN PROGRESS FOR 5 DAYSPerformed at [place]: Scheduled:  Scheduled:  . amitriptyline  50 mg Oral QHS  . amLODipine  5 mg Oral Daily  . calcium acetate  1,334 mg Oral TID WC  . famotidine  10 mg Oral Daily  . feeding supplement  (PRO-STAT SUGAR FREE 64)  30 mL Oral BID  . gabapentin  200 mg Oral BID  . glipiZIDE  5 mg Oral QAC breakfast  . insulin aspart  0-9 Units Subcutaneous TID WC  . multivitamin  1 tablet Oral QHS  . piperacillin-tazobactam (ZOSYN)  IV  2.25 g Intravenous 3 times per day  . [START ON 06/03/2014] vancomycin  1,000 mg Intravenous To Hemo  . warfarin   Does not apply Once  . Warfarin - Pharmacist Dosing Inpatient   Does not apply q1800   Infusion[s]: Infusions:  . sodium chloride 10 mL/hr at 05/31/14 1940   Antibiotic[s]: Anti-infectives    Start     Dose/Rate Route Frequency Ordered Stop   06/03/14 1200  vancomycin (VANCOCIN) IVPB 1000 mg/200 mL premix     1,000 mg 200 mL/hr over 60 Minutes Intravenous To Hemodialysis 06/02/14 1147 06/04/14 1200   06/01/14 1300  vancomycin (VANCOCIN) IVPB 1000 mg/200 mL premix  Status:  Discontinued     1,000 mg 200 mL/hr over 60 Minutes  Intravenous  Once 06/01/14 0824 06/01/14 0929   06/01/14 1300  vancomycin (VANCOCIN) 1,750 mg in sodium chloride 0.9 % 250 mL IVPB     1,750 mg 250 mL/hr over 60 Minutes Intravenous  Once 06/01/14 0929 06/01/14 1654   06/01/14 1000  vancomycin (VANCOCIN) IVPB 1000 mg/200 mL premix  Status:  Discontinued     1,000 mg 200 mL/hr over 60 Minutes Intravenous  Once 06/01/14 0809 06/01/14 0824   05/31/14 2200  piperacillin-tazobactam (ZOSYN) IVPB 2.25 g     2.25 g 100 mL/hr over 30 Minutes Intravenous 3 times per day 05/31/14 2029     05/31/14 2130  vancomycin (VANCOCIN) IVPB 750 mg/150 ml premix     750 mg 150 mL/hr over 60 Minutes Intravenous  Once 05/31/14 2048 06/01/14 0002   05/31/14 1945  vancomycin (VANCOCIN) IVPB 1000 mg/200 mL premix  Status:  Discontinued     1,000 mg 200 mL/hr over 60 Minutes Intravenous Every 12 hours 05/31/14 1932 05/31/14 2027   05/31/14 1945  piperacillin-tazobactam (ZOSYN) IVPB 3.375 g  Status:  Discontinued     3.375 g 12.5 mL/hr over 240 Minutes Intravenous 4 times per day 05/31/14 1932  05/31/14 2029   05/31/14 1713  gentamicin (GARAMYCIN) injection  Status:  Discontinued       As needed 05/31/14 1713 05/31/14 1800   05/31/14 1713  vancomycin (VANCOCIN) powder  Status:  Discontinued       As needed 05/31/14 1714 05/31/14 1800      ASSESSMENT:  54 y.o. male is currently on Vancomycin and Zosyn for enterococcal wound infection.  S/P surgical debridement and placement of antibiotic beads.  Doses adjusted for renal function and HD schedule. Next HD scheduled Monday.  Continuing on Coumadin for VTE prophylaxis.  INR approaching therapeutic goals after 2 doses of Coumadin [noted that patient will not be fully anticoagulated for ~ 5 full days].  No evidence of acute bleeding.  GOAL:    TARGET INR 2-3  Pre-Dialysis level 15 - 25 mcg/ml  Zosyn dosed for clinical indication and adjusted for renal function.  PLAN: 1. Reduce Coumadin to 5 mg po today. 2. Continue Zosyn 2.25 gm IV q 8 hours. 3. Vancomycin 1 gm IV after HD on Monday. 4. Daily INR's, CBC. Monitor for bleeding complications.   Follow Platelet counts.  Marthenia Rolling,  Pharm.D   06/02/2014,  11:52 AM

## 2014-06-02 NOTE — Progress Notes (Signed)
[facility] Progress Note  Assessment/Plan: 1. 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 Sharol Given; IV Vanc and Zosyn started- tissue cultures from surgery pending TMAX 102.7- if he spikes a fever again, needs BC x 2, no sign ^ in WBC tissue culture from 5/20 growing enterococcus, ordered blood cultures 2. ESRD - MWF nocturnal - will run at 4 hours at higher BFR/DFR; rarely running full 8 hours - usually slightly over 7 and only ran 3 hr 40 min on Wed- Heparin held Saturday  back on schedule Monday. K 5 5/20 3. Hypertension/volume - EDW just lowered 5 kg had been leaving below edw - pre HD weight 94.3 with post wt 93.9 on Wed. Pre HD weigh 5/21 96.5 - goal set for 3 L on Sat with net UF of 2.3 and post weight of 94.2 4. Anemia - follow CBC Hgb 9.6 5/13 Down to 8 post op and now 7.2  last Mircera was 150 on 4/27 - had 225 ordered for 5/11 - but not given- likely secondary to missed HD treatments and Mircera not rescheduled; Aranesp 150 given 5/21; plan transfuse 2 units PRBC on HD Monday- continue no heparin HD given Hgb drop and use prn only 5. Metabolic bone disease - sensipar 30 and phoslo 1 ac, iPTH 168 05/2013 - corr Ca 10.5 - use 2 Ca bath- continue calitriol for now - watch Ca- d/c cholecalciferol; P 5.9 plan ^ phoslo to 2 AC 6. Nutrition - renal diet + vitamin; having progressive weight loss alb 2.1 - renal carb mod diet - added prostat 7. DM - BS ok 8. Dialysis noncompliance - needs to stay on full treatments AND not skip treatments; he is considering PD, but this would be contraindicated until infection has resolved and attendance is good.- we discussed this  9. VTE prophylaxis - started on coumadin - INR 1.9   Myriam Jacobson, PA-C [facility] Beeper [phone] 06/02/2014,9:13 AM  LOS: 2 days   Pt seen, examined, agree w assess/plan as above  with additions as indicated.  Kelly Splinter MD pager [phone]    cell [phone] 06/02/2014, 12:51 PM     Subjective:   Knee hurts. Doesn't like taking pain meds. Feels weak.  Objective Filed Vitals:   06/01/14 1700 06/01/14 2022 06/01/14 2241 06/02/14 0512  BP:  83/59 112/69 121/60  Pulse:  99  95  Temp: 101.7 F (38.7 C) 99.5 F (37.5 C)  100.1 F (37.8 C)  TempSrc:  Oral  Oral  Resp:  18  18  Height:      Weight:      SpO2:  96%  95%   Physical Exam General: sitting in chair, NAD Heart: RRR Lungs: few coarse BS otherwise clear Abdomen: soft NT Extremities: left tr-1+ LE edema with knee VAC, left no LE edema Dialysis Access: left upper AVF  Dialysis Orders: Center:[facility] - Nocturnal EDW 92 - just lowered from 97!!- had been leaving below EDW even with missing HD treatments.MWF 8 hours 300/500 2K 2 Ca left upper AVF heparin 4000 with 5 K mid tmt Mircera 225 q two weeks- ordered to start 5/11 but none given, venofer 50/week- given 5/18 calcitriol 2 Recent labs: Hgb 9.6 5/13    Additional Objective Labs: Lab Results  Component Value Date   INR 1.91* 06/02/2014   INR 1.33 06/01/2014   INR 1.27 99991111    Basic Metabolic Panel:  Recent Labs Lab 05/31/14 1510 05/31/14 2034  NA 139 139  K 4.5 5.0  CL 104 106  CO2 21* 20*  GLUCOSE 104* 91  BUN 40* 41*  CREATININE 7.28* 7.21*  CALCIUM 9.4 8.8*  PHOS  --  5.9*   Liver Function Tests:  Recent Labs Lab 05/31/14 1510 05/31/14 2034  AST 17  --   ALT 15*  --   ALKPHOS 87  --   BILITOT 1.0  --   PROT 7.8  --   ALBUMIN 2.6* 2.1*   CBC:  Recent Labs Lab 05/31/14 1510 05/31/14 2034 06/02/14 0439  WBC 7.4 7.0 8.4  HGB 9.2* 8.0* 7.2*  HCT 29.2* 26.2* 22.9*  MCV 85.9 87.3 86.7  PLT 348 329 288   Blood Culture    Component Value Date/Time   SDES WOUND 05/31/2014 1714   SDES WOUND 05/31/2014 1714   SDES TISSUE 05/31/2014 1714   SDES WOUND 05/31/2014 1714   SPECREQUEST LEFT KNEE 05/31/2014 1714    SPECREQUEST LEFT KNEE 05/31/2014 1714   SPECREQUEST LEFT KNEE 05/31/2014 1714   SPECREQUEST LEFT KNEE 05/31/2014 1714   CULT  05/31/2014 1714    NO ANAEROBES ISOLATED; CULTURE IN PROGRESS FOR 5 DAYS Performed at [place]  05/31/2014 1714    CULTURE IN PROGRESS FOR FOUR WEEKS Performed at [place]  05/31/2014 1714    Culture reincubated for better growth Performed at [place]  05/31/2014 1714    CULTURE WILL BE EXAMINED FOR 6 WEEKS BEFORE ISSUING A FINAL REPORT Performed at [place] PENDING 05/31/2014 1714   REPTSTATUS PENDING 05/31/2014 1714   REPTSTATUS PENDING 05/31/2014 1714   REPTSTATUS PENDING 05/31/2014 1714    CBG:  Recent Labs Lab 06/01/14 0809 06/01/14 1228 06/01/14 1715 06/01/14 2141 06/02/14 0701  GLUCAP 132* 128* 91 116* 80  Medications: . sodium chloride 10 mL/hr at 05/31/14 1940   . amitriptyline  50 mg Oral QHS  . amLODipine  5 mg Oral Daily  . calcium acetate  667 mg Oral TID WC  . famotidine  10 mg Oral Daily  . feeding supplement (PRO-STAT SUGAR FREE 64)  30 mL Oral BID  . gabapentin  200 mg Oral BID  . glipiZIDE  5 mg Oral QAC breakfast  . insulin aspart  0-9 Units Subcutaneous TID WC  . multivitamin  1 tablet Oral QHS  . piperacillin-tazobactam (ZOSYN)  IV  2.25 g Intravenous 3 times per day  . warfarin   Does not apply Once  . Warfarin - Pharmacist Dosing Inpatient   Does not apply [phone]

## 2014-06-03 DIAGNOSIS — Z8673 Personal history of transient ischemic attack (TIA), and cerebral infarction without residual deficits: Secondary | ICD-10-CM

## 2014-06-03 DIAGNOSIS — N186 End stage renal disease: Secondary | ICD-10-CM

## 2014-06-03 DIAGNOSIS — E1140 Type 2 diabetes mellitus with diabetic neuropathy, unspecified: Secondary | ICD-10-CM

## 2014-06-03 DIAGNOSIS — T8453XA Infection and inflammatory reaction due to internal right knee prosthesis, initial encounter: Secondary | ICD-10-CM

## 2014-06-03 DIAGNOSIS — E1122 Type 2 diabetes mellitus with diabetic chronic kidney disease: Secondary | ICD-10-CM

## 2014-06-03 DIAGNOSIS — B952 Enterococcus as the cause of diseases classified elsewhere: Secondary | ICD-10-CM

## 2014-06-03 LAB — TISSUE CULTURE

## 2014-06-03 LAB — CBC
HCT: 24.3 % — ABNORMAL LOW (ref 39.0–52.0)
Hemoglobin: 7.6 g/dL — ABNORMAL LOW (ref 13.0–17.0)
MCH: 27.3 pg (ref 26.0–34.0)
MCHC: 31.3 g/dL (ref 30.0–36.0)
MCV: 87.4 fL (ref 78.0–100.0)
Platelets: 340 10*3/uL (ref 150–400)
RBC: 2.78 MIL/uL — ABNORMAL LOW (ref 4.22–5.81)
RDW: 15.9 % — AB (ref 11.5–15.5)
WBC: 10.9 10*3/uL — AB (ref 4.0–10.5)

## 2014-06-03 LAB — HEMOGLOBIN AND HEMATOCRIT, BLOOD
HEMATOCRIT: 22.9 % — AB (ref 39.0–52.0)
HEMOGLOBIN: 7.2 g/dL — AB (ref 13.0–17.0)

## 2014-06-03 LAB — GLUCOSE, CAPILLARY
Glucose-Capillary: 100 mg/dL — ABNORMAL HIGH (ref 65–99)
Glucose-Capillary: 79 mg/dL (ref 65–99)
Glucose-Capillary: 192 mg/dL — ABNORMAL HIGH (ref 65–99)
Glucose-Capillary: 147 mg/dL — ABNORMAL HIGH (ref 65–99)

## 2014-06-03 LAB — PREPARE RBC (CROSSMATCH)

## 2014-06-03 LAB — SEDIMENTATION RATE

## 2014-06-03 LAB — PROTIME-INR
INR: 2.47 — ABNORMAL HIGH (ref 0.00–1.49)
Prothrombin Time: 26.4 seconds — ABNORMAL HIGH (ref 11.6–15.2)

## 2014-06-03 LAB — C-REACTIVE PROTEIN: CRP: 22.9 mg/dL — ABNORMAL HIGH (ref <1.0)

## 2014-06-03 MED ORDER — WARFARIN SODIUM 5 MG PO TABS: 5.0000 mg | ORAL_TABLET | Freq: Once | ORAL | Status: DC

## 2014-06-03 MED ORDER — SODIUM CHLORIDE 0.9 % IV SOLN: Freq: Once | INTRAVENOUS | Status: DC

## 2014-06-03 MED ORDER — VANCOMYCIN HCL IN DEXTROSE 1-5 GM/200ML-% IV SOLN
1000.0000 mg | INTRAVENOUS | Status: AC
  Administered 2014-06-03: 1000 mg via INTRAVENOUS
  Filled 2014-06-03: qty 200

## 2014-06-03 MED ORDER — WARFARIN SODIUM 5 MG PO TABS
2.5000 mg | ORAL_TABLET | Freq: Once | ORAL | Status: AC
  Administered 2014-06-03: 2.5 mg via ORAL
  Filled 2014-06-03: qty 1

## 2014-06-03 NOTE — Progress Notes (Signed)
PT Cancellation Note  Patient Details Name: Matthew Kline MRN: [id number] DOB: 14-Dec-1960   Cancelled Treatment:    Reason Eval/Treat Not Completed: Patient at procedure or test/unavailable. Patient off floor in HD all morning this far. Will follow up as schedule allows   Jacqualyn Posey 06/03/2014, 10:56 AM

## 2014-06-03 NOTE — Progress Notes (Signed)
CBG 147 

## 2014-06-03 NOTE — Consult Note (Addendum)
[facility] for Infectious Disease  Date of Admission:  05/31/2014  Date of Consult:  06/03/2014  Reason for Consult: Wound infection R knee TKR Referring Physician: Sharol Given  Impression/Recommendation Infection R TKR ESRD DM2 with neuropathy, nephropathy  Would Stop zosyn Continue vanco for 6 weeks at HD After 6 weeks, consider po Augmentin for 5 more months.  CONSIDER neuro eval for his mental status and if he has neuro changes. He has had a previous lacunar infarct (he denied hx of stroke on asking).   Glad to see him in ID clinic for f/u.   Thank you so much for this interesting consult,   [name] (pager) [phone] [email or web address]  Matthew Kline is an 54 y.o. male.  HPI: 54 yo M with DM2 (~ 44yrs), ESRD, who had R TKR 3-20. He states that ~ 4 weeks later he fell on a wood floor onto his knee. His would dehisced after this. By last week he was having increasing pain and swelling in his leg and was unable to ambulate. He was admitted on 5-20 and underwent I & D and exchange of parts, anbx beads, VAC. He had temp 102 at that time. He denies proximal erythema .  His operative Cx has since grown enterococcus.   Past Medical History  Diagnosis Date  . Polymyalgia rheumatica   . Hypertension   . Diabetic neuropathy   . Osteomyelitis of foot, left, acute   . Anxiety   . Insomnia, unspecified   . Unspecified vitamin D deficiency   . Anemia, unspecified   . Other chronic postoperative pain   . Unspecified hereditary and idiopathic peripheral neuropathy   . Allergy   . Unspecified osteomyelitis, site unspecified   . Long term (current) use of anticoagulants   . 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 Ulcer     diabetic foot   . Arthralgia 2010    polyarticular  . Hemorrhoids, internal 10/2011    small  . CHF (congestive heart failure)   . CHF (congestive heart failure) 07/25/2009    denies  . Hemodialysis access site with mature fistula    . Myocardial infarction 1995  . Pneumonia     "probably 4-5 times" (05/09/2014)  . Sleep apnea     "lost weight; no more problem" (05/09/2014)  . Type II diabetes mellitus dx'd 1995  . History of blood transfusion     "related to the anemia"  . GERD (gastroesophageal reflux disease)     hx "before I lost weight"  . ESRD (end stage renal disease) on dialysis     started 12/2012; "MWF; [facility]"  . Stroke 01/10/06    denies residual on 05/09/2014  . Arthritis     "back, knees" (05/09/2014)  . Chronic lower back pain     Past Surgical History  Procedure Laterality Date  . Amputation  01/21/2012    Procedure: AMPUTATION RAY;  Surgeon: [name], MD;  Location: [place];  Service: Orthopedics;  Laterality: Left;  Left Foot 4th Ray Amputation  . Anterior cervical decomp/discectomy fusion  02/2011  . Knee arthroscopy Left 08-25-2012  . Toe amputation Bilateral     "I've lost 7 toes over the last 7 years" (05/09/2014)  . Refractive surgery Bilateral   . Bascilic vein transposition Left 10/19/2012    Procedure: BASCILIC VEIN TRANSPOSITION;  Surgeon: Serafina Mitchell, MD;  Location: [place];  Service: Vascular;  Laterality: Left;  . Tonsillectomy    .  Amputation Left 05/04/2013    Procedure: AMPUTATION DIGIT;  Surgeon: [name], MD;  Location: [place];  Service: Orthopedics;  Laterality: Left;  Left Great Toe Amputation at [facility]  . Toe surgery Left April 2015    Big toe removed on left foot.  . Total knee arthroplasty Left 04/10/2014    Procedure: TOTAL KNEE ARTHROPLASTY;  Surgeon: [name], MD;  Location: [place];  Service: Orthopedics;  Laterality: Left;  . Wound debridement Left 05/09/2014    Dehiscence Left Total Knee Arthroplasty Incision  . Back surgery    . Uvulopalatopharyngoplasty, tonsillectomy and septoplasty  ~ 1989  . I&d extremity Left 05/09/2014    Procedure: Irrigation and Debridement Left Knee and Closure of Total Knee Arthroplasty Incision;  Surgeon: [name], MD;   Location: [place];  Service: Orthopedics;  Laterality: Left;     Allergies  Allergen Reactions  . Morphine And Related     hallucinations  . Tygacil [Tigecycline] Nausea And Vomiting, Nausea Only and Other (See Comments)    Medications:  Scheduled: . sodium chloride   Intravenous Once  . amitriptyline  50 mg Oral QHS  . amLODipine  5 mg Oral Daily  . calcium acetate  1,334 mg Oral TID WC  . famotidine  10 mg Oral Daily  . feeding supplement (PRO-STAT SUGAR FREE 64)  30 mL Oral BID  . gabapentin  200 mg Oral Daily  . glipiZIDE  5 mg Oral QAC breakfast  . insulin aspart  0-9 Units Subcutaneous TID WC  . multivitamin  1 tablet Oral QHS  . piperacillin-tazobactam (ZOSYN)  IV  2.25 g Intravenous 3 times per day  . warfarin  5 mg Oral ONCE-1800  . warfarin   Does not apply Once  . Warfarin - Pharmacist Dosing Inpatient   Does not apply q1800    Abtx:  Anti-infectives    Start     Dose/Rate Route Frequency Ordered Stop   06/03/14 1200  vancomycin (VANCOCIN) IVPB 1000 mg/200 mL premix  Status:  Discontinued     1,000 mg 200 mL/hr over 60 Minutes Intravenous To Hemodialysis 06/02/14 1147 06/03/14 0856   06/03/14 1200  vancomycin (VANCOCIN) IVPB 1000 mg/200 mL premix     1,000 mg 200 mL/hr over 60 Minutes Intravenous To Hemodialysis 06/03/14 0958 06/03/14 1132   06/01/14 1300  vancomycin (VANCOCIN) IVPB 1000 mg/200 mL premix  Status:  Discontinued     1,000 mg 200 mL/hr over 60 Minutes Intravenous  Once 06/01/14 0824 06/01/14 0929   06/01/14 1300  vancomycin (VANCOCIN) 1,750 mg in sodium chloride 0.9 % 250 mL IVPB     1,750 mg 250 mL/hr over 60 Minutes Intravenous  Once 06/01/14 0929 06/01/14 1654   06/01/14 1000  vancomycin (VANCOCIN) IVPB 1000 mg/200 mL premix  Status:  Discontinued     1,000 mg 200 mL/hr over 60 Minutes Intravenous  Once 06/01/14 0809 06/01/14 0824   05/31/14 2200  piperacillin-tazobactam (ZOSYN) IVPB 2.25 g     2.25 g 100 mL/hr over 30 Minutes Intravenous 3  times per day 05/31/14 2029     05/31/14 2130  vancomycin (VANCOCIN) IVPB 750 mg/150 ml premix     750 mg 150 mL/hr over 60 Minutes Intravenous  Once 05/31/14 2048 06/01/14 0002   05/31/14 1945  vancomycin (VANCOCIN) IVPB 1000 mg/200 mL premix  Status:  Discontinued     1,000 mg 200 mL/hr over 60 Minutes Intravenous Every 12 hours 05/31/14 1932 05/31/14 2027  05/31/14 1945  piperacillin-tazobactam (ZOSYN) IVPB 3.375 g  Status:  Discontinued     3.375 g 12.5 mL/hr over 240 Minutes Intravenous 4 times per day 05/31/14 1932 05/31/14 2029   05/31/14 1713  gentamicin (GARAMYCIN) injection  Status:  Discontinued       As needed 05/31/14 1713 05/31/14 1800   05/31/14 1713  vancomycin (VANCOCIN) powder  Status:  Discontinued       As needed 05/31/14 1714 05/31/14 1800      Total days of antibiotics: 4 vanco/zosyn          Social History:  reports that he has been smoking Cigarettes.  He has a 3.84 pack-year smoking history. He has never used smokeless tobacco. He reports that he does not drink alcohol or use illicit drugs.  Family History  Problem Relation Age of Onset  . Hypertension Mother   . Cancer Mother 20    Ovarian  . Heart disease Maternal Aunt   . Stroke Maternal Grandfather     General ROS: + fever, no chills. + constipation, urine normal. normal vision (has not seen ophtho this year). + neuropathy. see HPI.   Blood pressure 120/64, pulse 103, temperature 98.6 F (37 C), temperature source Oral, resp. rate 18, height 5\' 11"  (1.803 m), weight 94.4 kg (208 lb 1.8 oz), SpO2 95 %. General appearance: alert, cooperative and no distress Eyes: negative findings: pupils equal, round, reactive to light and accomodation Throat: abnormal findings: poor dentition, no thrush.  Neck: no adenopathy and supple, symmetrical, trachea midline Lungs: clear to auscultation bilaterally Heart: regular rate and rhythm Abdomen: normal findings: bowel sounds normal and soft,  non-tender Extremities: vac on R knee. wound is clean.  Neurologic: Mental status: difficulty with word finding.  Sensory: grossly decreased BLE.    Results for orders placed or performed during the hospital encounter of 05/31/14 (from the past 48 hour(s))  Glucose, capillary     Status: None   Collection Time: 06/01/14  5:15 PM  Result Value Ref Range   Glucose-Capillary 91 65 - 99 mg/dL  Glucose, capillary     Status: Abnormal   Collection Time: 06/01/14  9:41 PM  Result Value Ref Range   Glucose-Capillary 116 (H) 65 - 99 mg/dL  Protime-INR     Status: Abnormal   Collection Time: 06/02/14  4:39 AM  Result Value Ref Range   Prothrombin Time 21.8 (H) 11.6 - 15.2 seconds   INR 1.91 (H) 0.00 - 1.49  CBC     Status: Abnormal   Collection Time: 06/02/14  4:39 AM  Result Value Ref Range   WBC 8.4 4.0 - 10.5 K/uL   RBC 2.64 (L) 4.22 - 5.81 MIL/uL   Hemoglobin 7.2 (L) 13.0 - 17.0 g/dL   HCT 22.9 (L) 39.0 - 52.0 %   MCV 86.7 78.0 - 100.0 fL   MCH 27.3 26.0 - 34.0 pg   MCHC 31.4 30.0 - 36.0 g/dL   RDW 15.8 (H) 11.5 - 15.5 %   Platelets 288 150 - 400 K/uL  Glucose, capillary     Status: None   Collection Time: 06/02/14  7:01 AM  Result Value Ref Range   Glucose-Capillary 80 65 - 99 mg/dL  Culture, blood (routine x 2)     Status: None (Preliminary result)   Collection Time: 06/02/14 11:40 AM  Result Value Ref Range   Specimen Description BLOOD BLOOD RIGHT ARM    Special Requests BOTTLES DRAWN AEROBIC AND ANAEROBIC  10CC  Culture             BLOOD CULTURE RECEIVED NO GROWTH TO DATE CULTURE WILL BE HELD FOR 5 DAYS BEFORE ISSUING A FINAL NEGATIVE REPORT Performed at [facility]    Report Status PENDING   Culture, blood (routine x 2)     Status: None (Preliminary result)   Collection Time: 06/02/14 11:47 AM  Result Value Ref Range   Specimen Description BLOOD BLOOD RIGHT ARM    Special Requests BOTTLES DRAWN AEROBIC AND ANAEROBIC  10CC    Culture             BLOOD  CULTURE RECEIVED NO GROWTH TO DATE CULTURE WILL BE HELD FOR 5 DAYS BEFORE ISSUING A FINAL NEGATIVE REPORT Performed at [facility]    Report Status PENDING   Glucose, capillary     Status: None   Collection Time: 06/02/14 12:02 PM  Result Value Ref Range   Glucose-Capillary 97 65 - 99 mg/dL  Glucose, capillary     Status: None   Collection Time: 06/02/14  3:51 PM  Result Value Ref Range   Glucose-Capillary 94 65 - 99 mg/dL  Glucose, capillary     Status: Abnormal   Collection Time: 06/02/14  8:46 PM  Result Value Ref Range   Glucose-Capillary 106 (H) 65 - 99 mg/dL  Protime-INR     Status: Abnormal   Collection Time: 06/03/14  4:57 AM  Result Value Ref Range   Prothrombin Time 26.4 (H) 11.6 - 15.2 seconds   INR 2.47 (H) 0.00 - 1.49  CBC     Status: Abnormal   Collection Time: 06/03/14  4:57 AM  Result Value Ref Range   WBC 10.9 (H) 4.0 - 10.5 K/uL   RBC 2.78 (L) 4.22 - 5.81 MIL/uL   Hemoglobin 7.6 (L) 13.0 - 17.0 g/dL   HCT 24.3 (L) 39.0 - 52.0 %   MCV 87.4 78.0 - 100.0 fL   MCH 27.3 26.0 - 34.0 pg   MCHC 31.3 30.0 - 36.0 g/dL   RDW 15.9 (H) 11.5 - 15.5 %   Platelets 340 150 - 400 K/uL  Glucose, capillary     Status: Abnormal   Collection Time: 06/03/14  6:50 AM  Result Value Ref Range   Glucose-Capillary 100 (H) 65 - 99 mg/dL  Type and screen     Status: None (Preliminary result)   Collection Time: 06/03/14  7:35 AM  Result Value Ref Range   ABO/RH(D) O NEG    Antibody Screen NEG    Sample Expiration 06/06/2014    Unit Number [id number]    Blood Component Type RED CELLS,LR    Unit division 00    Status of Unit ISSUED    Transfusion Status OK TO TRANSFUSE    Crossmatch Result Compatible   Prepare RBC     Status: None   Collection Time: 06/03/14  7:35 AM  Result Value Ref Range   Order Confirmation ORDER PROCESSED BY BLOOD BANK   Glucose, capillary     Status: None   Collection Time: 06/03/14 12:10 PM  Result Value Ref Range   Glucose-Capillary 79 65  - 99 mg/dL   Comment 1 Notify RN    Comment 2 Document in Chart       Component Value Date/Time   SDES BLOOD BLOOD RIGHT ARM 06/02/2014 1147   SPECREQUEST BOTTLES DRAWN AEROBIC AND ANAEROBIC  10CC 06/02/2014 1147   CULT  06/02/2014 1147  BLOOD CULTURE RECEIVED NO GROWTH TO DATE CULTURE WILL BE HELD FOR 5 DAYS BEFORE ISSUING A FINAL NEGATIVE REPORT Performed at [facility] PENDING 06/02/2014 1147   No results found. Recent Results (from the past 240 hour(s))  Anaerobic culture     Status: None (Preliminary result)   Collection Time: 05/31/14  5:14 PM  Result Value Ref Range Status   Specimen Description WOUND  Final   Special Requests LEFT KNEE  Final   Gram Stain   Final    FEW WBC PRESENT,BOTH PMN AND MONONUCLEAR NO SQUAMOUS EPITHELIAL CELLS SEEN NO ORGANISMS SEEN Performed at [facility]    Culture   Final    NO ANAEROBES ISOLATED; CULTURE IN PROGRESS FOR 5 DAYS Performed at [facility]    Report Status PENDING  Incomplete  Fungus Culture with Smear     Status: None (Preliminary result)   Collection Time: 05/31/14  5:14 PM  Result Value Ref Range Status   Specimen Description WOUND  Final   Special Requests LEFT KNEE  Final   Fungal Smear   Final    NO YEAST OR FUNGAL ELEMENTS SEEN Performed at [facility]    Culture   Final    CULTURE IN PROGRESS FOR FOUR WEEKS Performed at [facility]    Report Status PENDING  Incomplete  Tissue culture     Status: None   Collection Time: 05/31/14  5:14 PM  Result Value Ref Range Status   Specimen Description TISSUE  Final   Special Requests LEFT KNEE  Final   Gram Stain   Final    FEW WBC PRESENT,BOTH PMN AND MONONUCLEAR NO SQUAMOUS EPITHELIAL CELLS SEEN NO ORGANISMS SEEN Performed at [facility]    Culture   Final    MODERATE ENTEROCOCCUS SPECIES Performed at [facility]    Report Status 06/03/2014 FINAL  Final   Organism ID,  Bacteria ENTEROCOCCUS SPECIES  Final      Susceptibility   Enterococcus species - MIC*    VANCOMYCIN 1 SENSITIVE Sensitive     AMPICILLIN <=2 SENSITIVE Sensitive     * MODERATE ENTEROCOCCUS SPECIES  AFB culture with smear     Status: None (Preliminary result)   Collection Time: 05/31/14  5:14 PM  Result Value Ref Range Status   Specimen Description WOUND  Final   Special Requests LEFT KNEE  Final   Acid Fast Smear   Final    NO ACID FAST BACILLI SEEN Performed at [facility]    Culture   Final    CULTURE WILL BE EXAMINED FOR 6 WEEKS BEFORE ISSUING A FINAL REPORT Performed at [facility]    Report Status PENDING  Incomplete  Culture, blood (routine x 2)     Status: None (Preliminary result)   Collection Time: 06/02/14 11:40 AM  Result Value Ref Range Status   Specimen Description BLOOD BLOOD RIGHT ARM  Final   Special Requests BOTTLES DRAWN AEROBIC AND ANAEROBIC  10CC  Final   Culture   Final           BLOOD CULTURE RECEIVED NO GROWTH TO DATE CULTURE WILL BE HELD FOR 5 DAYS BEFORE ISSUING A FINAL NEGATIVE REPORT Performed at [facility]    Report Status PENDING  Incomplete  Culture, blood (routine x 2)     Status: None (Preliminary result)   Collection Time: 06/02/14 11:47 AM  Result Value Ref Range Status  Specimen Description BLOOD BLOOD RIGHT ARM  Final   Special Requests BOTTLES DRAWN AEROBIC AND ANAEROBIC  10CC  Final   Culture   Final           BLOOD CULTURE RECEIVED NO GROWTH TO DATE CULTURE WILL BE HELD FOR 5 DAYS BEFORE ISSUING A FINAL NEGATIVE REPORT Performed at [facility]    Report Status PENDING  Incomplete      06/03/2014, 1:58 PM     LOS: 3 days

## 2014-06-03 NOTE — Progress Notes (Addendum)
Patient infiltrated fistula twice during treatment today due to patient not keeping arm still and holding arm very tight to torso. First time went above infiltration and continued treatment. After second infiltration, could not restick patient due to no room above infiltration. Pt received one unit of blood and partial vancomycin. VSS entire treament. NET UF removed was 1043 ml.  Dr Joelyn Oms notified

## 2014-06-03 NOTE — Progress Notes (Signed)
[name], male   DOB: [birth year], 54 y.o.   MRN: [id number] Patient in dialysis this morning. Initial cultures are positive for enterococcus. Final sensitivities and cultures are pending. We will request infectious disease consultation for long-term antibiotics. Patient did have an infected total knee arthroplasty the polyethylene tray was removed and antibiotic beads were placed within the joint.

## 2014-06-03 NOTE — Progress Notes (Signed)
Report given to Ocie Cornfield, RN in hemodialysis department. Dialysis will come to get patient for treatment as the schedule permits. Nursing will continue to monitor.

## 2014-06-03 NOTE — Progress Notes (Addendum)
ANTICOAGULATION CONSULT NOTE - Follow Up Consult  Pharmacy Consult for warfarin Indication: VTE prophylaxis  Allergies  Allergen Reactions  . Morphine And Related     hallucinations  . Tygacil [Tigecycline] Nausea And Vomiting, Nausea Only and Other (See Comments)    Patient Measurements: Height: 5\' 11"  (180.3 cm) Weight: 208 lb 1.8 oz (94.4 kg) IBW/kg (Calculated) : 75.3  Vital Signs: Temp: 99.2 F (37.3 C) (05/23 0930) Temp Source: Oral (05/23 0930) BP: 148/78 mmHg (05/23 1100) Pulse Rate: 99 (05/23 1100)  Labs:  Recent Labs  05/31/14 1510 05/31/14 2034 06/01/14 0410 06/02/14 0439 06/03/14 0457  HGB 9.2* 8.0*  --  7.2* 7.6*  HCT 29.2* 26.2*  --  22.9* 24.3*  PLT 348 329  --  288 340  APTT 38*  --   --   --   --   LABPROT 16.0*  --  16.6* 21.8* 26.4*  INR 1.27  --  1.33 1.91* 2.47*  CREATININE 7.28* 7.21*  --   --   --     Estimated Creatinine Clearance: 13.6 mL/min (by C-G formula based on Cr of 7.21).   Medications:  Scheduled:  . sodium chloride   Intravenous Once  . amitriptyline  50 mg Oral QHS  . amLODipine  5 mg Oral Daily  . calcium acetate  1,334 mg Oral TID WC  . famotidine  10 mg Oral Daily  . feeding supplement (PRO-STAT SUGAR FREE 64)  30 mL Oral BID  . gabapentin  200 mg Oral Daily  . glipiZIDE  5 mg Oral QAC breakfast  . insulin aspart  0-9 Units Subcutaneous TID WC  . multivitamin  1 tablet Oral QHS  . piperacillin-tazobactam (ZOSYN)  IV  2.25 g Intravenous 3 times per day  . warfarin   Does not apply Once  . Warfarin - Pharmacist Dosing Inpatient   Does not apply q1800    Assessment: 54 yo m admitted on 5/20 for I&D of TKA. Patient is currently on warfarin for VTE prophylaxis. INR this AM is therapeutic at 2.47 after two 7.5 mg doses and 5 mg x 1 last night.  Hgb low at 7.6 but stable, plts 340, no issues noted.  Goal of Therapy:  INR 2-3 Monitor platelets by anticoagulation protocol: Yes   Plan:  Warfarin 2.5 mg x 1 tonight F/u  INR in AM to determine further dosing Daily INR/CBC Monitor hgb/plts, s/s of bleeding, clinical course  Elzie Knisley L. Nicole Kindred, PharmD Clinical Pharmacy Resident Pager: [phone] 06/03/2014 12:28 PM

## 2014-06-03 NOTE — Procedures (Signed)
I was present at this dialysis session. I have reviewed the session itself and made appropriate changes.   UF goal 2L.  Enterococcus wound culture is Amp sensitive.  On Vanc/Zosyn.  ID to see today. Have vanc and cefazolin in HD unit if IV Abx preferred. RS  Pearson Grippe  MD 06/03/2014, 8:50 AM

## 2014-06-04 ENCOUNTER — Encounter (HOSPITAL_COMMUNITY): Payer: Self-pay | Admitting: Orthopedic Surgery

## 2014-06-04 LAB — CBC
HCT: 25.6 % — ABNORMAL LOW (ref 39.0–52.0)
Hemoglobin: 7.9 g/dL — ABNORMAL LOW (ref 13.0–17.0)
MCH: 26.8 pg (ref 26.0–34.0)
MCHC: 30.9 g/dL (ref 30.0–36.0)
MCV: 86.8 fL (ref 78.0–100.0)
Platelets: 339 10*3/uL (ref 150–400)
RBC: 2.95 MIL/uL — AB (ref 4.22–5.81)
RDW: 15.6 % — AB (ref 11.5–15.5)
WBC: 8.6 10*3/uL (ref 4.0–10.5)

## 2014-06-04 LAB — GLUCOSE, CAPILLARY
GLUCOSE-CAPILLARY: 115 mg/dL — AB (ref 65–99)
GLUCOSE-CAPILLARY: 158 mg/dL — AB (ref 65–99)
Glucose-Capillary: 79 mg/dL (ref 65–99)
Glucose-Capillary: 74 mg/dL (ref 65–99)

## 2014-06-04 LAB — PROTIME-INR
INR: 2.98 — ABNORMAL HIGH (ref 0.00–1.49)
Prothrombin Time: 30.5 seconds — ABNORMAL HIGH (ref 11.6–15.2)

## 2014-06-04 LAB — VANCOMYCIN, RANDOM: VANCOMYCIN RM: 30 ug/mL

## 2014-06-04 MED ORDER — VANCOMYCIN HCL IN DEXTROSE 1-5 GM/200ML-% IV SOLN
1000.0000 mg | INTRAVENOUS | Status: DC
  Administered 2014-06-06: 1000 mg via INTRAVENOUS
  Filled 2014-06-04 (×2): qty 200

## 2014-06-04 MED ORDER — SODIUM CHLORIDE 0.9 % IV SOLN
500.0000 mg | INTRAVENOUS | Status: DC
  Filled 2014-06-04 (×2): qty 500

## 2014-06-04 MED ORDER — OXYCODONE HCL 5 MG PO TABS
5.0000 mg | ORAL_TABLET | ORAL | Status: DC | PRN
  Administered 2014-06-04 – 2014-06-06 (×6): 5 mg via ORAL
  Filled 2014-06-04 (×6): qty 1

## 2014-06-04 MED ORDER — CINACALCET HCL 30 MG PO TABS
30.0000 mg | ORAL_TABLET | Freq: Every day | ORAL | Status: DC
  Administered 2014-06-04 – 2014-06-07 (×4): 30 mg via ORAL
  Filled 2014-06-04 (×6): qty 1

## 2014-06-04 MED ORDER — WARFARIN SODIUM 2 MG PO TABS
2.0000 mg | ORAL_TABLET | Freq: Once | ORAL | Status: AC
  Administered 2014-06-04: 2 mg via ORAL
  Filled 2014-06-04 (×2): qty 1

## 2014-06-04 MED ORDER — DARBEPOETIN ALFA 150 MCG/0.3ML IJ SOSY
150.0000 ug | PREFILLED_SYRINGE | INTRAMUSCULAR | Status: DC
  Filled 2014-06-04: qty 0.3

## 2014-06-04 NOTE — Progress Notes (Signed)
[name], male   DOB: 1960-07-25, 54 y.o.   MRN: [id number] Patient is seen this evening and follow-up. He disconnected his wound VAC so he could ambulate in the room. The wound VAC has not been reattached. The dressing was removed the wound edges are well approximated a Mepilex dressing and Ace wrap are applied. Patient is again developing a flexion contracture and I discussed the importance of him working on knee extension. We will get him a new foam block to work on knee extension. Patient currently is not safe for independent ambulation and is not safe for discharge to home we will set him up for skilled nursing placement. Possible discharge Thursday or Friday.

## 2014-06-04 NOTE — Progress Notes (Signed)
[name], male   DOB: 07/17/1960, 54 y.o.   MRN: [id number] Patient sitting at bedside this morning. Wound VAC is functioning well. Anticipate patient may be discharged to home with IV vancomycin for 6 weeks followed by oral Augmentin for 5 months once he is safe with physical therapy and we'll set up home health physical therapy again. We will discharge incisional wound VAC prior to discharge.

## 2014-06-04 NOTE — Progress Notes (Signed)
[facility] Progress Note  Assessment/Plan: 1. Recurrent wound dehiscence of LTKR initially done 3/30 with enterococcus tissue culture 5/20 following tramatic fall; s/p replacement of Polyethylene trade, antibiotic beads and VAC placement 5/20 Dr. Sharol Given;  IV antibiotics narrowed to Vanc- ID recommends 6 weeks then 5 months of Augmentin.         2. ESRD - MWF nocturnal - will run at 4 hours at higher BFR/DFR; problems with access infiltration yesterday - did not receive full treatment.- - ok to resume tight heparin -  3. Hypertension/volume - EDW being lowered at outpt center - net UF only 1 L yesterday due to infiltration - post weight not done - should have been about 93.4 by calculations 4. Anemia - Hgb 7.9 up from 7.2 after one unit of blood;   Aranesp 150 per week - given 5/23 - will redose again on Friday 5. Metabolic bone disease - sensipar 30 and phoslo 1 ac, iPTH 168 05/2013  corr Ca 10.3 - use 2 Ca bath continue calitriol for now - watch Ca- d/c cholecalciferol; P 5.9 plan ^ phoslo to 2 [facility]- recheck labs pre HD Wed - not done on Mon 6. Nutrition - renal diet + vitamin; having progressive weight loss alb 2.1 - renal carb mod diet - added prostat 7. DM - BS ok 8. Dialysis noncompliance - needs to stay on full treatments AND not skip treatments; he is considering PD, but this would be contraindicated until infection has resolved and attendance is good.- we discussed this  9. VTE prophylaxis - started on coumadin - INR 2.98 -per pharm   Myriam Jacobson, PA-C [place] [phone] 06/04/2014,9:05 AM  LOS: 4 days   Subjective:   No new events. Seen by ID, plan as above Had small infiltration, HD curtailed.  1L UF; appears stable this AM + B/T, minimal ecchymoses  Objective Filed Vitals:   06/03/14 1100 06/03/14 1352 06/03/14 1950 06/04/14 0622  BP: 148/78 120/64 136/73 115/50  Pulse: 99 103 98 98  Temp:  98.6 F (37 C) 99 F (37.2 C) 99.2  F (37.3 C)  TempSrc:  Oral Axillary   Resp:  18  17  Height:      Weight:      SpO2:  95% 99% 93%   Physical Exam General: Heart: Lungs: Abdomen: Extremities: Dialysis Access: left upper AVF  Dialysis Orders: Center:[facility] - Nocturnal EDW 92 - just lowered from 97!!- had been leaving below EDW even with missing HD treatments.MWF 8 hours 300/500 2K 2 Ca left upper AVF heparin 4000 with 5 K mid tmt Mircera 225 q two weeks- ordered to start 5/11 but none given, venofer 50/week- given 5/18 calcitriol 2 Recent labs: Hgb 9.6 5/13   Additional Objective Labs: Lab Results  Component Value Date   INR 2.98* 06/04/2014   INR 2.47* 06/03/2014   INR 1.91* [id number]    Basic Metabolic Panel:  Recent Labs Lab 05/31/14 1510 05/31/14 2034  NA 139 139  K 4.5 5.0  CL 104 106  CO2 21* 20*  GLUCOSE 104* 91  BUN 40* 41*  CREATININE 7.28* 7.21*  CALCIUM 9.4 8.8*  PHOS  --  5.9*   Liver Function Tests:  Recent Labs Lab 05/31/14 1510 05/31/14 2034  AST 17  --   ALT 15*  --   ALKPHOS 87  --   BILITOT 1.0  --   PROT 7.8  --   ALBUMIN 2.6* 2.1*   CBC:  Recent Labs Lab 05/31/14 1510 05/31/14 2034 06/02/14 0439 06/03/14 0457 06/03/14 1715 06/04/14 0620  WBC 7.4 7.0 8.4 10.9*  --  8.6  HGB 9.2* 8.0* 7.2* 7.6* 7.2* 7.9*  HCT 29.2* 26.2* 22.9* 24.3* 22.9* 25.6*  MCV 85.9 87.3 86.7 87.4  --  86.8  PLT 348 329 288 340  --  339   Blood Culture    Component Value Date/Time   SDES BLOOD BLOOD RIGHT ARM 06/02/2014 1147   SPECREQUEST BOTTLES DRAWN AEROBIC AND ANAEROBIC  10CC 06/02/2014 1147   CULT  06/02/2014 1147           BLOOD CULTURE RECEIVED NO GROWTH TO DATE CULTURE WILL BE HELD FOR 5 DAYS BEFORE ISSUING A FINAL NEGATIVE REPORT Performed at [place] PENDING 06/02/2014 1147    CBG:  Recent Labs Lab 06/03/14 0650 06/03/14 1210 06/03/14 1638 06/03/14 2231 06/04/14 0618  GLUCAP 100* 79 192* 147* 79  Medications: . sodium chloride 10  mL/hr at 05/31/14 1940   . sodium chloride   Intravenous Once  . amitriptyline  50 mg Oral QHS  . amLODipine  5 mg Oral Daily  . calcium acetate  1,334 mg Oral TID WC  . famotidine  10 mg Oral Daily  . feeding supplement (PRO-STAT SUGAR FREE 64)  30 mL Oral BID  . gabapentin  200 mg Oral Daily  . glipiZIDE  5 mg Oral QAC breakfast  . insulin aspart  0-9 Units Subcutaneous TID WC  . multivitamin  1 tablet Oral QHS  . warfarin   Does not apply Once  . Warfarin - Pharmacist Dosing Inpatient   Does not apply [phone]

## 2014-06-04 NOTE — Progress Notes (Signed)
ANTICOAGULATION AND ANTIBIOTIC CONSULT NOTE - Follow Up Consult  Pharmacy Consult for warfarin/vancomycin Indication: VTE prophylaxis/wound infection  Allergies  Allergen Reactions  . Morphine And Related     hallucinations  . Tygacil [Tigecycline] Nausea And Vomiting, Nausea Only and Other (See Comments)    Patient Measurements: Height: 5\' 11"  (180.3 cm) Weight: 208 lb 1.8 oz (94.4 kg) IBW/kg (Calculated) : 75.3  Vital Signs: Temp: 98.6 F (37 C) (05/24 1321) Temp Source: Oral (05/24 1321) BP: 142/68 mmHg (05/24 1321) Pulse Rate: 104 (05/24 1321)  Labs:  Recent Labs  06/02/14 0439 06/03/14 0457 06/03/14 1715 06/04/14 0620  HGB 7.2* 7.6* 7.2* 7.9*  HCT 22.9* 24.3* 22.9* 25.6*  PLT 288 340  --  339  LABPROT 21.8* 26.4*  --  30.5*  INR 1.91* 2.47*  --  2.98*    Estimated Creatinine Clearance: 13.6 mL/min (by C-G formula based on Cr of 7.21).   Medications:  Scheduled:  . sodium chloride   Intravenous Once  . amitriptyline  50 mg Oral QHS  . amLODipine  5 mg Oral Daily  . calcium acetate  1,334 mg Oral TID WC  . cinacalcet  30 mg Oral Q supper  . [START ON 06/07/2014] darbepoetin (ARANESP) injection - DIALYSIS  150 mcg Intravenous Q Fri-HD  . famotidine  10 mg Oral Daily  . feeding supplement (PRO-STAT SUGAR FREE 64)  30 mL Oral BID  . gabapentin  200 mg Oral Daily  . glipiZIDE  5 mg Oral QAC breakfast  . insulin aspart  0-9 Units Subcutaneous TID WC  . multivitamin  1 tablet Oral QHS  . warfarin   Does not apply Once  . Warfarin - Pharmacist Dosing Inpatient   Does not apply q1800    Assessment: 54 yo m admitted on 5/20 for I&D and TKA after falling at home.  Pharmacy is consulted to dose warfarin for VTE prophylaxis.  INR this AM is therapeutic but at the higher end of the range at 2.98.  Will give a smaller dose tonight. Hgb low at 7.9 but stable, plts 339. No bleeding or issues noted.  Pharmacy is also consulted to dose vancomycin for enterococcal wound  infection. He is ESRD on HD MWF.  A random vancomycin level was obtained this AM and found to be 30.  Patient has received 750 mg x 1, 1750 mg x 1, and 1,000 mg x 1 of vancomycin since admission.  He only received a partial dose of his 1,000 mg yesterday during HD due to infiltration of IV. Will give a one time low dose tomorrow with HD and put him on a schedule starting on Friday with HD. Wbc wnl, afebrile.   Vancomycin 5/20 >> (6 wks) VR 5/24 = 30 Zosyn 5/20 >> 5/23  Tissue Cx: Enterococcus (s to amp and vanc) BCx: pending Fungus: pending AFB: pending  Goal of Therapy:  INR 2-3 Monitor platelets by anticoagulation protocol: Yes  Pre-HD vancomycin level 15-25 mcg/mL   Plan:  Warfarin 2 mg x 1 tonight F/u INR in AM to determine further dosing Daily INR Vancomycin 500 mg x 1 with HD tomorrow 5/25 Schedule vancomycin 1,000 mg IV qMWF-HD starting Friday 5/27 Monitor cbc, temperature curve, hgb/plts, s/s of bleeding, clinical course  Cassie L. Nicole Kindred, PharmD Clinical Pharmacy Resident Pager: [phone] 06/04/2014 1:40 PM

## 2014-06-04 NOTE — Progress Notes (Signed)
Pt is an ESRD patient & had HD yesterday.  Pt became slightly confused this am after giving him 1mg  of dilaudid.  No more pain meds have been given today.  Wife is at bedside and is concerned that hes receiving too much pain medicine and wants his Lorrin Mais d/ced as well.  Pt becomes confused off and on at times and is irritated, pupils pinpoint Dr. Sharol Given notified & wife requested a phone call sometime today or tomorrow

## 2014-06-04 NOTE — Progress Notes (Signed)
Physical Therapy Treatment Patient Details Name: Matthew Kline MRN: [id number] DOB: November 07, 1960 Today's Date: 06/04/2014    History of Present Illness Patient is a 54 year old gentleman who is status post total knee arthroplasty when he fell at home sustaining a complete dehiscence of the surgical incision. Patient was brought to the operating room urgently and underwent irrigation debridement and placement of antibiotic beads and wound closure 05-09-14. Patient did not have any communication with the joint with the surgical incision. Patient presents at this time with progressive dehiscence of the surgical incision. Additional I&D performed 05-31-14. PMH consists of HTN, CVA (2007), MI, DM, multi toe amputations, and ESRD on HD MWF.    PT Comments    Patient appears drowsy this AM. Per RN was given meds earlier and they have had this affect. Patient unable to fully participate in therapy and stated it was too painful to walk more than to the recliner. Patient has assistance from wife and children at home. If he does not progress with ambulation due to pain, we made need to consider use of wheelchair for further mobility. Will continue to work on gait as able. Patient does have steps to enter his house  Follow Up Recommendations  Home health PT;Supervision/Assistance - 24 hour     Equipment Recommendations  None recommended by PT    Recommendations for Other Services       Precautions / Restrictions Precautions Precautions: Fall;Knee;Other (comment) Precaution Comments: Wound vac  Restrictions Weight Bearing Restrictions: Yes LLE Weight Bearing: Weight bearing as tolerated    Mobility  Bed Mobility               General bed mobility comments: Patien sitting EOB upon arrival  Transfers Overall transfer level: Needs assistance Equipment used: Rolling walker (2 wheeled)   Sit to Stand: Min assist         General transfer comment: Min A to power up into standing. Cues  for safe use of RW and hand placement  Ambulation/Gait Ambulation/Gait assistance: Min assist Ambulation Distance (Feet): 5 Feet Assistive device: Rolling walker (2 wheeled) Gait Pattern/deviations: Decreased step length - right;Decreased stance time - left;Trunk flexed   Gait velocity interpretation: Below normal speed for age/gender General Gait Details: Pain limiting weight through LLE and unable to walk further due to pain. Attempted to rest and have patient walk again but patient stated that he was unable.    Stairs            Wheelchair Mobility    Modified Rankin (Stroke Patients Only)       Balance                                    Cognition Arousal/Alertness: Lethargic Behavior During Therapy: WFL for tasks assessed/performed Overall Cognitive Status: Impaired/Different from baseline Area of Impairment: Attention;Memory;Following commands   Current Attention Level: Sustained Memory: Decreased short-term memory Following Commands: Follows one step commands with increased time       General Comments: When asked patient what equipment he had at home, he pointed to the IV pole and stated " I have one of those on wheels". Patient falling asleep in midsentance.     Exercises      General Comments        Pertinent Vitals/Pain Pain Score: 9  Pain Location: L knee Pain Descriptors / Indicators: Aching;Sore Pain Intervention(s): Monitored during session;Premedicated before session  Home Living Family/patient expects to be discharged to:: Private residence Living Arrangements: Spouse/significant other                  Prior Function            PT Goals (current goals can now be found in the care plan section) Progress towards PT goals: Progressing toward goals (vwery slowly)    Frequency  Min 6X/week    PT Plan Current plan remains appropriate    Co-evaluation             End of Session Equipment Utilized During  Treatment: Gait belt Activity Tolerance: Patient limited by pain Patient left: in chair;with call bell/phone within reach     Time: 0940-1004 PT Time Calculation (min) (ACUTE ONLY): 24 min  Charges:  $Gait Training: 8-22 mins $Therapeutic Activity: 8-22 mins                    G Codes:      Jacqualyn Posey 06/04/2014, 10:45 AM  06/04/2014 Jacqualyn Posey PTA [phone] pager [phone] office

## 2014-06-04 NOTE — Progress Notes (Signed)
CBG: 79. RN notified. Offered pt orange juice.

## 2014-06-04 NOTE — Care Management Note (Signed)
Case Management Note  Patient Details  Name: Damen Gile MRN: [id number] Date of Birth: 04/18/1960  Subjective/Objective:  Patient admitted with left knee wound deheisence. Patient underwent I & D with placement of antibiotics  And wound vac.            Action/Plan:  Patient used [facility] with original surgery, will use them now. Referral called to [place], [facility] Liaison. Patient will need HHRN for IV antibiotics and HH PT.     Expected Discharge Date:  06/04/14               Expected Discharge Plan:  [place]  In-House Referral:  NA  Discharge planning Services  CM Consult  Post Acute Care Choice:  Home Health, Resumption of Svcs/PTA Provider Choice offered to:  Patient  DME Arranged:  N/A DME Agency:     HH Arranged:  RN, PT [place] Agency:  [place]  Status of Service:  In process, will continue to follow  Medicare Important Message Given:    Date Medicare IM Given:    Medicare IM give by:    Date Additional Medicare IM Given:    Additional Medicare Important Message give by:     If discussed at [place] of Stay Meetings, dates discussed:    Additional Comments:  Ninfa Meeker, RN 06/04/2014, 10:29 AM

## 2014-06-05 LAB — GLUCOSE, CAPILLARY
GLUCOSE-CAPILLARY: 123 mg/dL — AB (ref 65–99)
Glucose-Capillary: 72 mg/dL (ref 65–99)
Glucose-Capillary: 55 mg/dL — ABNORMAL LOW (ref 65–99)
Glucose-Capillary: 73 mg/dL (ref 65–99)
Glucose-Capillary: 94 mg/dL (ref 65–99)

## 2014-06-05 LAB — CBC
HCT: 22.1 % — ABNORMAL LOW (ref 39.0–52.0)
HCT: 24.6 % — ABNORMAL LOW (ref 39.0–52.0)
Hemoglobin: 6.9 g/dL — CL (ref 13.0–17.0)
Hemoglobin: 7.8 g/dL — ABNORMAL LOW (ref 13.0–17.0)
MCH: 26.8 pg (ref 26.0–34.0)
MCH: 27 pg (ref 26.0–34.0)
MCHC: 31.2 g/dL (ref 30.0–36.0)
MCHC: 31.7 g/dL (ref 30.0–36.0)
MCV: 86 fL (ref 78.0–100.0)
MCV: 85.1 fL (ref 78.0–100.0)
Platelets: 328 10*3/uL (ref 150–400)
Platelets: 386 10*3/uL (ref 150–400)
RBC: 2.57 MIL/uL — ABNORMAL LOW (ref 4.22–5.81)
RBC: 2.89 MIL/uL — ABNORMAL LOW (ref 4.22–5.81)
RDW: 15.6 % — ABNORMAL HIGH (ref 11.5–15.5)
RDW: 15.5 % (ref 11.5–15.5)
WBC: 8.3 10*3/uL (ref 4.0–10.5)
WBC: 9.1 10*3/uL (ref 4.0–10.5)

## 2014-06-05 LAB — RENAL FUNCTION PANEL
Albumin: 2 g/dL — ABNORMAL LOW (ref 3.5–5.0)
Anion gap: 14 (ref 5–15)
BUN: 37 mg/dL — ABNORMAL HIGH (ref 6–20)
CO2: 25 mmol/L (ref 22–32)
Calcium: 9.8 mg/dL (ref 8.9–10.3)
Chloride: 99 mmol/L — ABNORMAL LOW (ref 101–111)
Creatinine, Ser: 7.7 mg/dL — ABNORMAL HIGH (ref 0.61–1.24)
GFR calc Af Amer: 8 mL/min — ABNORMAL LOW (ref >60)
GFR calc non Af Amer: 7 mL/min — ABNORMAL LOW (ref >60)
Glucose, Bld: 82 mg/dL (ref 65–99)
Phosphorus: 4.2 mg/dL (ref 2.5–4.6)
Potassium: 4.2 mmol/L (ref 3.5–5.1)
Sodium: 138 mmol/L (ref 135–145)

## 2014-06-05 LAB — ANAEROBIC CULTURE

## 2014-06-05 LAB — PREPARE RBC (CROSSMATCH)

## 2014-06-05 LAB — PROTIME-INR
INR: 3.14 — ABNORMAL HIGH (ref 0.00–1.49)
PROTHROMBIN TIME: 31.7 s — AB (ref 11.6–15.2)

## 2014-06-05 MED ORDER — SODIUM CHLORIDE 0.9 % IV SOLN: 100.0000 mL | INTRAVENOUS | Status: DC | PRN

## 2014-06-05 MED ORDER — SODIUM CHLORIDE 0.9 % IV SOLN: Freq: Once | INTRAVENOUS | Status: DC

## 2014-06-05 MED ORDER — NEPRO/CARBSTEADY PO LIQD
237.0000 mL | ORAL | Status: DC | PRN
  Filled 2014-06-05: qty 237

## 2014-06-05 MED ORDER — PENTAFLUOROPROP-TETRAFLUOROETH EX AERO: 1.0000 "application " | INHALATION_SPRAY | CUTANEOUS | Status: DC | PRN

## 2014-06-05 MED ORDER — ALTEPLASE 2 MG IJ SOLR
2.0000 mg | Freq: Once | INTRAMUSCULAR | Status: DC | PRN
  Filled 2014-06-05: qty 2

## 2014-06-05 MED ORDER — HEPARIN SODIUM (PORCINE) 1000 UNIT/ML DIALYSIS
1000.0000 [IU] | INTRAMUSCULAR | Status: DC | PRN
  Filled 2014-06-05: qty 1

## 2014-06-05 MED ORDER — LIDOCAINE-PRILOCAINE 2.5-2.5 % EX CREA
1.0000 "application " | TOPICAL_CREAM | CUTANEOUS | Status: DC | PRN
  Filled 2014-06-05: qty 5

## 2014-06-05 MED ORDER — HEPARIN SODIUM (PORCINE) 1000 UNIT/ML DIALYSIS
20.0000 [IU]/kg | INTRAMUSCULAR | Status: DC | PRN
  Filled 2014-06-05: qty 2

## 2014-06-05 MED ORDER — SODIUM CHLORIDE 0.9 % IV SOLN
500.0000 mg | Freq: Once | INTRAVENOUS | Status: AC
  Administered 2014-06-06: 500 mg via INTRAVENOUS
  Filled 2014-06-05: qty 500

## 2014-06-05 MED ORDER — LIDOCAINE HCL (PF) 1 % IJ SOLN: 5.0000 mL | INTRAMUSCULAR | Status: DC | PRN

## 2014-06-05 NOTE — Clinical Social Work Note (Signed)
CSW received referral for change in disposition; patient now for SNF placement. CSW attempted to meet with patient at bedside. Patient currently off the floor for dialysis. CSW to attempt to contact patient's wife regarding patient's discharge disposition.  Lubertha Sayres, [place] Clinical Social Work Department Orthopedics [phone]) and Surgical [phone])

## 2014-06-05 NOTE — Progress Notes (Signed)
CBG 93 

## 2014-06-05 NOTE — Progress Notes (Signed)
CM talked to patient and his wife in patient's room.  Both want to wait until 06/06/14 to decide if patient will go to [facility] or use Home Health.  Patient says his wife and other family members will supervise and support him if he choose to go home with [facility],  CM called Lelan Pons of [facility] at [phone] and left voice mail message that patient will need HHPT if he choose to go home with HHPT.

## 2014-06-05 NOTE — Progress Notes (Signed)
Pt HD treatment for 18 minutes. He bent access arm during tx and re infiltrated arm. MD aware. Report called. Patient returned safely to room.

## 2014-06-05 NOTE — Clinical Social Work Note (Signed)
Clinical Social Work Assessment  Patient Details  Name: Matthew Kline MRN: [id number] Date of Birth: 26-Apr-1960  Date of referral:  06/05/14               Reason for consult:  Discharge Planning, Facility Placement                Permission sought to share information with:  Other, Family Supports (Patient's wife.) Permission granted to share information::   (Patient off unit when CSW contacted patient's wife.)  Name::        Agency::     Relationship::   Spouse  Contact Information:     Housing/Transportation Living arrangements for the past 2 months:  Single Family Home Source of Information:  Spouse Patient Interpreter Needed:  None Criminal Activity/Legal Involvement Pertinent to Current Situation/Hospitalization:  No - Comment as needed Significant Relationships:  Spouse Lives with:  Spouse, Parents Do you feel safe going back to the place where you live?  Yes Need for family participation in patient care:  Yes (Comment) (Patient's wife active in patient's care.)  Care giving concerns:  Patient's wife expressed concern regarding PT recommendation. Patient's wife requesting patient continue to work with PT and discharge home once medically stable.   Social Worker assessment / plan:  CSW spoke with patient's wife regarding PT recommendation for SNF placement. Patient's wife currently refusing for CSW to complete SNF search. Per patient's wife, patient's wife to arrange for patient to be cared for at home once medically stable for discharge. RNCM updated.  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  Patient/Family's Understanding of and Emotional Response to  Diagnosis, Current Treatment, and Prognosis:  Patient's wife not agreeable to SNF placement at this time.  Emotional Assessment Appearance:  Other (Comment Required (CSW spoke with patient's wife as patient was off the unit in dialysis.) Attitude/Demeanor/Rapport:  Other (CSW spoke with patient's wife as patient was off the unit in dialysis.) Affect (typically observed):  Other (CSW spoke with patient's wife as patient was off the unit in dialysis.) Orientation:  Oriented to Self, Oriented to Place, Oriented to  Time, Oriented to Situation Alcohol / Substance use:  Not Applicable Psych involvement (Current and /or in the community):  No (Comment)  Discharge Needs  Concerns to be addressed:  No discharge needs identified Readmission within the last 30 days:  Yes Current discharge risk:  None Barriers to Discharge:  No Barriers Identified   Caroline Sauger, LCSW 06/05/2014, 1:52 PM [phone]

## 2014-06-05 NOTE — Care Management Note (Signed)
Case Management Note  Patient Details  Name: Matthew Kline MRN: [id number] Date of Birth: 08/16/1960  Subjective/Objective:    Patient is from home.  Dehiscence irrigation and debridement of total knee arthroplasty incision. I&D with wound vac done                Action/Plan:  Patient and wife want to wait until tomorrow - 06/05/13 to finalize if patient will go to [facility] or use Home health. Expected Discharge Date:  06/06/14              Expected Discharge Plan:  [place]  In-House Referral:  NA  Discharge planning Services  CM Consult  Post Acute Care Choice:  Home Health, Hospice Choice offered to:  Patient  DME Arranged:  N/A DME Agency:     HH Arranged:  RN, PT [place] Agency:  [place]  Status of Service:  In process, will continue to follow  Medicare Important Message Given:  Yes Date Medicare IM Given:  06/04/14 Medicare IM give by:  Marylyn Ishihara, RN Date Additional Medicare IM Given:    Additional Medicare Important Message give by:     If discussed at [place] of Stay Meetings, dates discussed:    Additional Comments:  Dimas Aguas, RN 06/05/2014, 8:27 PM

## 2014-06-05 NOTE — Progress Notes (Signed)
[facility] Progress Note  Assessment/Plan: 1. Recurrent wound dehiscence of LTKR initially done 3/30 with enterococcus tissue culture 5/20 following tramatic fall; s/p replacement of Polyethylene trade, antibiotic beads and VAC placement 5/20 Dr. Sharol Given;  IV antibiotics narrowed to Vanc- ID recommends 6 weeks then 5 months of Augmentin.         2. ESRD - MWF nocturnal - will run at 4 hours at higher BFR/DFR; Cont to have trouble w/ infiltration, not big issue as an outpt.  Try to keep arm still.  Make up HD tomorrow 5/26 3. Hypertension/volume - Cont to challenge EDW, 4L goal Next Tx.  4. Anemia - Hgb 7.9 up from 7.2 after one unit of blood;   Aranesp 150 per week - given 5/23 - will redose again on Friday 5. Metabolic bone disease - sensipar 30 and phoslo 1 ac, iPTH 168 05/2013  corr Ca 10.3 - use 2 Ca bath continue calitriol for now - watch Ca- d/c cholecalciferol; P 5.9 plan ^ phoslo to 2 [facility]- recheck labs pre HD Thurs- not done on Mon 6. Nutrition - renal diet + vitamin; having progressive weight loss alb 2.1 - renal carb mod diet - added prostat 7. DM - BS ok 8. Dialysis noncompliance - needs to stay on full treatments AND not skip treatments; he is considering PD, but this would be contraindicated until infection has resolved and attendance is good.- we discussed this  9. VTE prophylaxis - started on coumadin - INR 2.98 -per Silvio Clayman MD 06/05/2014,3:56 PM  LOS: 5 days   Subjective:   Infiltrated AVF x2 today, only rec 88min Tx.  Labs ok.  Pt was moving arm during.  Plan for SNF.    Objective Filed Vitals:   06/05/14 0505 06/05/14 1312 06/05/14 1325 06/05/14 1424  BP: 131/64 132/87 122/67 130/69  Pulse: 91 90 90 86  Temp: 99 F (37.2 C) 98.8 F (37.1 C)  98.9 F (37.2 C)  TempSrc: Oral Oral  Oral  Resp: 18 18 16 18   Height:      Weight:  92.8 kg (204 lb 9.4 oz)    SpO2: 92% 91%  94%   Physical  Exam General: Heart: Lungs: Abdomen: Extremities: Dialysis Access: left upper AVF  Dialysis Orders: Center:[facility] - Nocturnal EDW 92 - just lowered from 97!!- had been leaving below EDW even with missing HD treatments.MWF 8 hours 300/500 2K 2 Ca left upper AVF heparin 4000 with 5 K mid tmt Mircera 225 q two weeks- ordered to start 5/11 but none given, venofer 50/week- given 5/18 calcitriol 2 Recent labs: Hgb 9.6 5/13   Additional Objective Labs: Lab Results  Component Value Date   INR 3.14* 06/05/2014   INR 2.98* 06/04/2014   INR 2.47* [id number]    Basic Metabolic Panel:  Recent Labs Lab 05/31/14 1510 05/31/14 2034 06/05/14 1049  NA 139 139 138  K 4.5 5.0 4.2  CL 104 106 99*  CO2 21* 20* 25  GLUCOSE 104* 91 82  BUN 40* 41* 37*  CREATININE 7.28* 7.21* 7.70*  CALCIUM 9.4 8.8* 9.8  PHOS  --  5.9* 4.2   Liver Function Tests:  Recent Labs Lab 05/31/14 1510 05/31/14 2034 06/05/14 1049  AST 17  --   --   ALT 15*  --   --   ALKPHOS 87  --   --   BILITOT 1.0  --   --   PROT 7.8  --   --  ALBUMIN 2.6* 2.1* 2.0*   CBC:  Recent Labs Lab 06/02/14 0439 06/03/14 0457  06/04/14 0620 06/05/14 0449 06/05/14 1049  WBC 8.4 10.9*  --  8.6 8.3 9.1  HGB 7.2* 7.6*  < > 7.9* 6.9* 7.8*  HCT 22.9* 24.3*  < > 25.6* 22.1* 24.6*  MCV 86.7 87.4  --  86.8 86.0 85.1  PLT 288 340  --  339 328 386  < > = values in this interval not displayed. Blood Culture    Component Value Date/Time   SDES BLOOD BLOOD RIGHT ARM 06/02/2014 1147   SPECREQUEST BOTTLES DRAWN AEROBIC AND ANAEROBIC  10CC 06/02/2014 1147   CULT  06/02/2014 1147           BLOOD CULTURE RECEIVED NO GROWTH TO DATE CULTURE WILL BE HELD FOR 5 DAYS BEFORE ISSUING A FINAL NEGATIVE REPORT Performed at [place] PENDING 06/02/2014 1147    CBG:  Recent Labs Lab 06/04/14 1116 06/04/14 1645 06/04/14 2203 06/05/14 0632 06/05/14 1159  GLUCAP 115* 74 158* 72 123*  Medications: . sodium  chloride 10 mL/hr at 05/31/14 1940   . sodium chloride   Intravenous Once  . sodium chloride   Intravenous Once  . amitriptyline  50 mg Oral QHS  . amLODipine  5 mg Oral Daily  . calcium acetate  1,334 mg Oral TID WC  . cinacalcet  30 mg Oral Q supper  . [START ON 06/07/2014] darbepoetin (ARANESP) injection - DIALYSIS  150 mcg Intravenous Q Fri-HD  . famotidine  10 mg Oral Daily  . feeding supplement (PRO-STAT SUGAR FREE 64)  30 mL Oral BID  . gabapentin  200 mg Oral Daily  . glipiZIDE  5 mg Oral QAC breakfast  . insulin aspart  0-9 Units Subcutaneous TID WC  . multivitamin  1 tablet Oral QHS  . vancomycin  500 mg Intravenous Q M,W,F-HD  . [START ON 06/07/2014] vancomycin  1,000 mg Intravenous Q M,W,F-HD  . warfarin   Does not apply Once  . Warfarin - Pharmacist Dosing Inpatient   Does not apply [phone]

## 2014-06-05 NOTE — Progress Notes (Signed)
CBG:78. RN notified.

## 2014-06-05 NOTE — Progress Notes (Signed)
Physical Therapy Treatment Patient Details Name: Matthew Kline MRN: [id number] DOB: 07-11-1960 Today's Date: 06/05/2014    History of Present Illness Patient is a 54 year old gentleman who is status post total knee arthroplasty when he fell at home sustaining a complete dehiscence of the surgical incision. Patient was brought to the operating room urgently and underwent irrigation debridement and placement of antibiotic beads and wound closure 05-09-14. Patient did not have any communication with the joint with the surgical incision. Patient presents at this time with progressive dehiscence of the surgical incision. Additional I&D performed 05-31-14. PMH consists of HTN, CVA (2007), MI, DM, multi toe amputations, and ESRD on HD MWF.    PT Comments    Patient is progressing well today and more cognitively appropriate and awake this session. MD concerned that patient will not have 24/7 supervision at home thus recommending SNF.Patient is not safe to DC home without 24/7 assist due to decreased safety awareness. At this time update plan to SNF unless 24/7 can be arranged at home. Patient is in agreement. Noted HGb was 6.9 but patient was assymptomatic throughout session  Follow Up Recommendations  SNF     Equipment Recommendations  None recommended by PT    Recommendations for Other Services       Precautions / Restrictions Precautions Precautions: Fall;Knee;Other (comment) Restrictions LLE Weight Bearing: Weight bearing as tolerated    Mobility  Bed Mobility         Supine to sit: Supervision     General bed mobility comments: Supervision for safety  Transfers Overall transfer level: Needs assistance Equipment used: Rolling walker (2 wheeled)   Sit to Stand: Min assist         General transfer comment: Min A to ensure balance with stand as patient somewhat unsteady. Cues for safe technique  Ambulation/Gait Ambulation/Gait assistance: Min guard Ambulation Distance  (Feet): 180 Feet Assistive device: Rolling walker (2 wheeled) Gait Pattern/deviations: Step-through pattern;Decreased stride length;Trunk flexed Gait velocity: decreased Gait velocity interpretation: Below normal speed for age/gender General Gait Details: Patient having difficulty with heel strike on the L side. Cues for safety and management with RW as patient running into obstacles mutiple times with gait.    Stairs            Wheelchair Mobility    Modified Rankin (Stroke Patients Only)       Balance                                    Cognition Arousal/Alertness: Awake/alert Behavior During Therapy: WFL for tasks assessed/performed Overall Cognitive Status: Impaired/Different from baseline Area of Impairment: Memory;Following commands;Safety/judgement   Current Attention Level: Sustained Memory: Decreased short-term memory;Decreased recall of precautions Following Commands: Follows one step commands with increased time Safety/Judgement: Decreased awareness of safety          Exercises General Exercises - Lower Extremity Quad Sets: AROM;Left;10 reps Long Arc Quad: AROM;Left;10 reps Heel Slides: AROM;Left;10 reps Straight Leg Raises: AAROM;Left;10 reps    General Comments        Pertinent Vitals/Pain Pain Assessment: No/denies pain    Home Living                      Prior Function            PT Goals (current goals can now be found in the care plan section) Progress towards PT goals: Progressing toward  goals    Frequency  Min 6X/week    PT Plan Discharge plan needs to be updated    Co-evaluation             End of Session   Activity Tolerance: Patient tolerated treatment well Patient left: in chair;with call bell/phone within reach;with nursing/sitter in room     Time: 0859-0925 PT Time Calculation (min) (ACUTE ONLY): 26 min  Charges:  $Gait Training: 8-22 mins $Therapeutic Exercise: 8-22 mins                     G Codes:      Jacqualyn Posey 06/05/2014, 9:34 AM 06/05/2014 Jacqualyn Posey PTA [phone] pager [phone] office

## 2014-06-05 NOTE — Progress Notes (Signed)
CBG:69. RN notified. Offered pt orange juice.

## 2014-06-05 NOTE — Progress Notes (Addendum)
[name], male   DOB: 02/26/1960, 54 y.o.   MRN: [id number] Patient is comfortable this morning. Again discussed the importance of working on extension and pillows were placed under his foot for him to work on knee extension exercises. Patient is making slow progress with therapy and will not be safe for discharge to home independently. We'll plan for discharge to skilled nursing.  Patient's hemoglobin has dropped to 6.9. We'll type and cross and transfuse with 2 units packed red blood cells.

## 2014-06-05 NOTE — Progress Notes (Signed)
ANTICOAGULATION CONSULT NOTE - Follow Up Consult  Pharmacy Consult for warfarin Indication: VTE prophylaxis  Allergies  Allergen Reactions  . Morphine And Related     hallucinations  . Tygacil [Tigecycline] Nausea And Vomiting, Nausea Only and Other (See Comments)    Patient Measurements: Height: 5\' 11"  (180.3 cm) Weight: 208 lb 1.8 oz (94.4 kg) IBW/kg (Calculated) : 75.3  Vital Signs: Temp: 99 F (37.2 C) (05/25 0505) Temp Source: Oral (05/25 0505) BP: 131/64 mmHg (05/25 0505) Pulse Rate: 91 (05/25 0505)  Labs:  Recent Labs  06/03/14 0457 06/03/14 1715 06/04/14 0620 06/05/14 0449  HGB 7.6* 7.2* 7.9* 6.9*  HCT 24.3* 22.9* 25.6* 22.1*  PLT 340  --  339 328  LABPROT 26.4*  --  30.5* 31.7*  INR 2.47*  --  2.98* 3.14*    Estimated Creatinine Clearance: 13.6 mL/min (by C-G formula based on Cr of 7.21).   Medications:  Scheduled:  . sodium chloride   Intravenous Once  . sodium chloride   Intravenous Once  . amitriptyline  50 mg Oral QHS  . amLODipine  5 mg Oral Daily  . calcium acetate  1,334 mg Oral TID WC  . cinacalcet  30 mg Oral Q supper  . [START ON 06/07/2014] darbepoetin (ARANESP) injection - DIALYSIS  150 mcg Intravenous Q Fri-HD  . famotidine  10 mg Oral Daily  . feeding supplement (PRO-STAT SUGAR FREE 64)  30 mL Oral BID  . gabapentin  200 mg Oral Daily  . glipiZIDE  5 mg Oral QAC breakfast  . insulin aspart  0-9 Units Subcutaneous TID WC  . multivitamin  1 tablet Oral QHS  . vancomycin  500 mg Intravenous Q M,W,F-HD  . [START ON 06/07/2014] vancomycin  1,000 mg Intravenous Q M,W,F-HD  . warfarin   Does not apply Once  . Warfarin - Pharmacist Dosing Inpatient   Does not apply q1800    Assessment: 54 yo m admitted on 5/20 and is s/p TKA after falling at home.  Pharmacy is consulted to dose warfarin for VTE prophylaxis.  INR is slightly supratherapeutic this AM at 3.14.  Will hold tonight's dose and f/u INR in the morning to redose. Hgb dropped to 6.9 -  transfusing 2 units, plts 328. No bleeding noted.   Goal of Therapy:  INR 2-3 Monitor platelets by anticoagulation protocol: Yes   Plan:  Hold warfarin tonight F/u INR in AM to determine further dosing Monitor hgb/plts, s/s of bleeding, clinical course  Cassie L. Nicole Kindred, PharmD Clinical Pharmacy Resident Pager: [phone] 06/05/2014 10:44 AM

## 2014-06-06 LAB — PROTIME-INR
INR: 2.09 — AB (ref 0.00–1.49)
Prothrombin Time: 23.3 seconds — ABNORMAL HIGH (ref 11.6–15.2)

## 2014-06-06 LAB — RENAL FUNCTION PANEL
Albumin: 1.9 g/dL — ABNORMAL LOW (ref 3.5–5.0)
Anion gap: 14 (ref 5–15)
BUN: 44 mg/dL — ABNORMAL HIGH (ref 6–20)
CHLORIDE: 97 mmol/L — AB (ref 101–111)
CO2: 24 mmol/L (ref 22–32)
CREATININE: 8.47 mg/dL — AB (ref 0.61–1.24)
Calcium: 9.3 mg/dL (ref 8.9–10.3)
GFR, EST AFRICAN AMERICAN: 7 mL/min — AB (ref >60)
GFR, EST NON AFRICAN AMERICAN: 6 mL/min — AB (ref >60)
GLUCOSE: 107 mg/dL — AB (ref 65–99)
PHOSPHORUS: 4.1 mg/dL (ref 2.5–4.6)
Potassium: 4.2 mmol/L (ref 3.5–5.1)
Sodium: 135 mmol/L (ref 135–145)

## 2014-06-06 LAB — CBC
HCT: 24.8 % — ABNORMAL LOW (ref 39.0–52.0)
Hemoglobin: 8 g/dL — ABNORMAL LOW (ref 13.0–17.0)
MCH: 27.4 pg (ref 26.0–34.0)
MCHC: 32.3 g/dL (ref 30.0–36.0)
MCV: 84.9 fL (ref 78.0–100.0)
Platelets: 359 10*3/uL (ref 150–400)
RBC: 2.92 MIL/uL — ABNORMAL LOW (ref 4.22–5.81)
RDW: 15.6 % — AB (ref 11.5–15.5)
WBC: 8 10*3/uL (ref 4.0–10.5)

## 2014-06-06 LAB — GLUCOSE, CAPILLARY
GLUCOSE-CAPILLARY: 87 mg/dL (ref 65–99)
Glucose-Capillary: 95 mg/dL (ref 65–99)
Glucose-Capillary: 147 mg/dL — ABNORMAL HIGH (ref 65–99)
Glucose-Capillary: 230 mg/dL — ABNORMAL HIGH (ref 65–99)

## 2014-06-06 MED ORDER — HEPARIN SODIUM (PORCINE) 1000 UNIT/ML DIALYSIS
20.0000 [IU]/kg | INTRAMUSCULAR | Status: DC | PRN
  Filled 2014-06-06: qty 2

## 2014-06-06 MED ORDER — WARFARIN SODIUM 5 MG PO TABS
5.0000 mg | ORAL_TABLET | Freq: Once | ORAL | Status: AC
  Administered 2014-06-06: 5 mg via ORAL
  Filled 2014-06-06: qty 1

## 2014-06-06 NOTE — Progress Notes (Signed)
Orthopedic Tech Progress Note Patient Details:  [name] 1960/12/25 [id number] No bone foams available at this time [name], male   DOB: Feb 04, 1960, 54 y.o.   MRN: [id number]   Fenton Foy 06/06/2014, 9:28 AM

## 2014-06-06 NOTE — Progress Notes (Signed)
Inpatient Diabetes Program Recommendations  AACE/ADA: New Consensus Statement on Inpatient Glycemic Control (2013)  Target Ranges:  Prepandial:   less than 140 mg/dL      Peak postprandial:   less than 180 mg/dL (1-2 hours)      Critically ill patients:  140 - 180 mg/dL   Reason for Visit: Hypoglycemia  Diabetes history: DM2 Outpatient Diabetes medications: glipizide 5 mg QAM Current orders for Inpatient glycemic control: glipizide 5 mg QAM, Novolog sensitive tidwc  Consider discontinuation of glipizide to prevent hypoglycemia while inpatient.  Will continue to follow. Thank you. Lorenda Peck, RD, LDN, CDE Inpatient Diabetes Coordinator [phone]

## 2014-06-06 NOTE — Progress Notes (Signed)
CBG 87. 

## 2014-06-06 NOTE — Clinical Social Work Note (Signed)
CSW spoke with patient's wife to follow-up regarding discharge disposition.  Per patient's wife, patient still adamant to discharge home instead of SNF. Patient's wife reports patient's wife unable to be available 24/7 the next two weeks due to work.  CSW offered support to patient's wife.   Patient's wife now agreeable to CSW completing SNF search in [place].  Patient's wife requesting to speak with RNCM regarding home health options. RNCM notified.  CSW to continue to follow and assist with discharge planning needs.  Lubertha Sayres, [place] Clinical Social Work Department Orthopedics [phone]) and Surgical [phone])

## 2014-06-06 NOTE — Progress Notes (Signed)
[name], male   DOB: 10/20/1960, 54 y.o.   MRN: [id number] Orders placed yesterday for the 0 bone foam. This has not been obtained yet. Orders placed for evaluation for skilled nursing care for discharge. Plan for daily dressing changes starting today. The importance of knee extension was again reinforced with the patient and his foot was placed back on pillows.

## 2014-06-06 NOTE — Progress Notes (Signed)
Patient back from dialysis and Ace wrap and ABD pads removed from knee, moderate drainage from site. MD saw site this am and ordered daily dressing changes. Site cleansed and mepalex applied followed by ace wrap.

## 2014-06-06 NOTE — Progress Notes (Signed)
PHARMACY NOTE  Pharmacy Consult :  54 y.o. male is currently on Coumadin for VTE prophylaxis .   Dosing Wt :  93 kg  LABS :  Recent Labs  06/03/14 1715 06/04/14 0620 06/05/14 0449 06/05/14 1049 06/06/14 0900 06/06/14 0945 06/06/14 1605  HGB 7.2* 7.9* 6.9* 7.8* 8.0*  --   --   HCT 22.9* 25.6* 22.1* 24.6* 24.8*  --   --   PLT  --  339 328 386 359  --   --   LABPROT  --  30.5* 31.7*  --   --   --  23.3*  INR  --  2.98* 3.14*  --   --   --  2.09*  CREATININE  --   --   --  7.70*  --  8.47*  --     MEDICATION: Scheduled:  Prescriptions prior to admission  Medication Sig Dispense Refill Last Dose  . amitriptyline (ELAVIL) 50 MG tablet Take 1 tablet (50 mg total) by mouth at bedtime. 90 tablet 3 05/30/2014 at Unknown time  . amLODipine (NORVASC) 5 MG tablet Take 1 tablet by mouth daily.  6 05/30/2014 at Unknown time  . calcium acetate (PHOSLO) 667 MG capsule Take 667 mg by mouth 3 (three) times daily with meals.   05/30/2014 at Unknown time  . Cholecalciferol (VITAMIN D PO) Take 1 tablet by mouth 3 (three) times a week. At [facility]   05/30/2014 at Unknown time  . glipiZIDE (GLUCOTROL) 5 MG tablet Take 1 tablet (5 mg total) by mouth daily before breakfast. 180 tablet 3 05/30/2014 at Unknown time  . HYDROcodone-acetaminophen (NORCO/VICODIN) 5-325 MG per tablet Take 1 tablet by mouth every 4 (four) hours as needed for moderate pain.   0 Past Week at Unknown time  . ranitidine (ZANTAC) 150 MG tablet Take 150 mg by mouth daily as needed for heartburn.    05/30/2014 at Unknown time  . zolpidem (AMBIEN) 10 MG tablet Take 1 tablet (10 mg total) by mouth at bedtime as needed for sleep. 90 tablet 1 05/30/2014 at Unknown time  . gabapentin (NEURONTIN) 100 MG capsule Take 2 capsules (200 mg total) by mouth 2 (two) times daily. (Patient taking differently: Take 200 mg by mouth daily. ) 360 capsule 3 05/08/2014 at Unknown time  . sildenafil (VIAGRA) 25 MG tablet Take 2  tablets (50 mg total) by mouth daily as needed for erectile dysfunction. 10 tablet 3 unknown   Infusion[s]: Scheduled:  . sodium chloride   Intravenous Once  . sodium chloride   Intravenous Once  . amitriptyline  50 mg Oral QHS  . amLODipine  5 mg Oral Daily  . calcium acetate  1,334 mg Oral TID WC  . cinacalcet  30 mg Oral Q supper  . [START ON 06/07/2014] darbepoetin (ARANESP) injection - DIALYSIS  150 mcg Intravenous Q Fri-HD  . famotidine  10 mg Oral Daily  . feeding supplement (PRO-STAT SUGAR FREE 64)  30 mL Oral BID  . gabapentin  200 mg Oral Daily  . glipiZIDE  5 mg Oral QAC breakfast  . insulin aspart  0-9 Units Subcutaneous TID WC  . multivitamin  1 tablet Oral QHS  . [START ON 06/07/2014] vancomycin  1,000 mg Intravenous Q M,W,F-HD  . warfarin   Does not apply Once  . Warfarin - Pharmacist Dosing Inpatient   Does not apply q1800   Antibiotic[s]: Current Anti-infectives    Start     Dose/Rate Route Frequency Ordered Stop  06/07/14 1200  vancomycin (VANCOCIN) IVPB 1000 mg/200 mL premix     1,000 mg 200 mL/hr over 60 Minutes Intravenous Every M-W-F (Hemodialysis) 06/04/14 1345       ASSESSMENT :  54 y.o. male is currently on Coumadin for VTE prophylaxis .   Today's INR dropped significantly after hold dose yesterday.   INR  2.09  No evidence of bleeding complications observed.  GOAL   TARGET INR 2-3   PLAN : 1. Will give Coumadin 5 mg po today. 2. Daily INR's, CBC. Monitor for bleeding complications.   Follow Platelet counts.  Marthenia Rolling,  Pharm.D   06/06/2014,  5:02 PM

## 2014-06-06 NOTE — Progress Notes (Signed)
CBG 230 

## 2014-06-06 NOTE — Progress Notes (Signed)
Ortho called to check on the status of the bone foam, no bone foams in stock and the ortho tech has this patient on their list to receive a bone foam. They are unsure of the time or date when the shipment of bone foams will arrive.

## 2014-06-06 NOTE — Procedures (Signed)
I was present at this dialysis session. I have reviewed the session itself and made appropriate changes.   Here after early infiltration yesterday.  Pt states he has uncontrolled jerks which contibutes but RN also notes phone use contributed to infiltration yesterday.  Plan today to provide some ipsilateral immobilization of arm.    Pearson Grippe  MD 06/06/2014, 9:31 AM

## 2014-06-07 LAB — CBC
HEMATOCRIT: 27.8 % — AB (ref 39.0–52.0)
HEMOGLOBIN: 8.7 g/dL — AB (ref 13.0–17.0)
MCH: 27.1 pg (ref 26.0–34.0)
MCHC: 31.3 g/dL (ref 30.0–36.0)
MCV: 86.6 fL (ref 78.0–100.0)
PLATELETS: 404 10*3/uL — AB (ref 150–400)
RBC: 3.21 MIL/uL — ABNORMAL LOW (ref 4.22–5.81)
RDW: 15.5 % (ref 11.5–15.5)
WBC: 7.9 10*3/uL (ref 4.0–10.5)

## 2014-06-07 LAB — TYPE AND SCREEN
ABO/RH(D): O NEG
Antibody Screen: NEGATIVE
Unit division: 0
Unit division: 0
Unit division: 0
Unit division: 0
Unit division: 0

## 2014-06-07 LAB — PROTIME-INR
INR: 2.16 — ABNORMAL HIGH (ref 0.00–1.49)
Prothrombin Time: 23.9 seconds — ABNORMAL HIGH (ref 11.6–15.2)

## 2014-06-07 LAB — GLUCOSE, CAPILLARY
GLUCOSE-CAPILLARY: 114 mg/dL — AB (ref 65–99)
Glucose-Capillary: 62 mg/dL — ABNORMAL LOW (ref 65–99)
Glucose-Capillary: 73 mg/dL (ref 65–99)
Glucose-Capillary: 120 mg/dL — ABNORMAL HIGH (ref 65–99)
Glucose-Capillary: 119 mg/dL — ABNORMAL HIGH (ref 65–99)

## 2014-06-07 MED ORDER — WARFARIN SODIUM 4 MG PO TABS
4.0000 mg | ORAL_TABLET | Freq: Once | ORAL | Status: AC
  Administered 2014-06-07: 4 mg via ORAL
  Filled 2014-06-07: qty 1

## 2014-06-07 MED ORDER — DARBEPOETIN ALFA 150 MCG/0.3ML IJ SOSY
150.0000 ug | PREFILLED_SYRINGE | INTRAMUSCULAR | Status: DC
  Filled 2014-06-07: qty 0.3

## 2014-06-07 MED ORDER — SEVELAMER CARBONATE 800 MG PO TABS
1600.0000 mg | ORAL_TABLET | Freq: Three times a day (TID) | ORAL | Status: DC
  Administered 2014-06-07 (×2): 1600 mg via ORAL
  Filled 2014-06-07 (×6): qty 2

## 2014-06-07 NOTE — Progress Notes (Signed)
Hypoglycemic Event  CBG: 62  Treatment: 15 GM carbohydrate snack   Symptoms: Shaky, hungry  Follow-up CBG: Time:0730 CBG Result:73  Possible Reasons for Event: Unknown  Comments/MD notified: Currently eating breakfast tray, encouraged pt to notify nursing if feeling symptoms associated with hypoglycemia.     Ethelda Chick G  Remember to initiate Hypoglycemia Order Set & complete

## 2014-06-07 NOTE — Progress Notes (Signed)
Physical Therapy Treatment Patient Details Name: Matthew Kline MRN: [id number] DOB: 13-Mar-1960 Today's Date: 06/07/2014    History of Present Illness Patient is a 54 year old gentleman who is status post total knee arthroplasty when he fell at home sustaining a complete dehiscence of the surgical incision. Patient was brought to the operating room urgently and underwent irrigation debridement and placement of antibiotic beads and wound closure 05-09-14. Patient did not have any communication with the joint with the surgical incision. Patient presents at this time with progressive dehiscence of the surgical incision. Additional I&D performed 05-31-14. PMH consists of HTN, CVA (2007), MI, DM, multi toe amputations, and ESRD on HD MWF.    PT Comments    Patient continues to have high safety concerns with gait and use of RW. Patient is refusing SNF and is planning to DC home with assistance from family and friends. Patient would benefit from HHPT. He has equipment from previous surgeries.   Follow Up Recommendations  Home health PT     Equipment Recommendations  None recommended by PT    Recommendations for Other Services       Precautions / Restrictions Precautions Precautions: Fall Restrictions LLE Weight Bearing: Weight bearing as tolerated    Mobility  Bed Mobility Overal bed mobility: Modified Independent                Transfers Overall transfer level: Needs assistance Equipment used: Rolling walker (2 wheeled)   Sit to Stand: Min guard         General transfer comment: Minguard for safety  Ambulation/Gait Ambulation/Gait assistance: Min guard Ambulation Distance (Feet): 200 Feet Assistive device: Rolling walker (2 wheeled) Gait Pattern/deviations: Step-through pattern;Trunk flexed Gait velocity: varies   General Gait Details: Patient having difficulty with heel strike on the L side. Cues for safety and management with RW as patient running into obstacles  mutiple times with gait and tends to run his legs into bottom of gait   Stairs   Stairs assistance: Min assist Stair Management: Step to pattern;Backwards Number of Stairs: 1 General stair comments: MinA for RW and for balance. Cues for technique  Wheelchair Mobility    Modified Rankin (Stroke Patients Only)       Balance                                    Cognition Arousal/Alertness: Awake/alert Behavior During Therapy: WFL for tasks assessed/performed Overall Cognitive Status: Impaired/Different from baseline Area of Impairment: Memory;Following commands;Safety/judgement         Safety/Judgement: Decreased awareness of safety          Exercises      General Comments        Pertinent Vitals/Pain Pain Assessment: No/denies pain    Home Living                      Prior Function            PT Goals (current goals can now be found in the care plan section) Progress towards PT goals: Progressing toward goals    Frequency  Min 3X/week    PT Plan Discharge plan needs to be updated    Co-evaluation             End of Session Equipment Utilized During Treatment: Gait belt Activity Tolerance: Patient tolerated treatment well Patient left: in chair;with call bell/phone within reach;with nursing/sitter in  room     Time: [id number] PT Time Calculation (min) (ACUTE ONLY): 18 min  Charges:  $Gait Training: 8-22 mins                    G Codes:      Jacqualyn Posey 06/07/2014, 12:55 PM  06/07/2014 Jacqualyn Posey PTA [phone] pager [phone] office

## 2014-06-07 NOTE — Progress Notes (Addendum)
ANTIBIOTIC AND ANTICOAGULATION CONSULT NOTE - FOLLOW UP  Pharmacy Consult for vancomycin/warfarin Indication: wound infection/VTE prophylaxis  Allergies  Allergen Reactions  . Morphine And Related     hallucinations  . Tygacil [Tigecycline] Nausea And Vomiting, Nausea Only and Other (See Comments)    Patient Measurements: Height: 5\' 11"  (180.3 cm) Weight: 205 lb 11 oz (93.3 kg) IBW/kg (Calculated) : 75.3  Vital Signs: Temp: 98.4 F (36.9 C) (05/27 0600) BP: 129/71 mmHg (05/27 0600) Pulse Rate: 82 (05/27 0600) Intake/Output from previous day: 05/26 0701 - 05/27 0700 In: 645 [P.O.:360; Blood:285] Out: 4332 [Urine:325] Intake/Output from this shift: Total I/O In: 240 [P.O.:240] Out: -   Labs:  Recent Labs  06/05/14 1049 06/06/14 0900 06/06/14 0945 06/07/14 0445  WBC 9.1 8.0  --  7.9  HGB 7.8* 8.0*  --  8.7*  PLT 386 359  --  404*  CREATININE 7.70*  --  8.47*  --    Estimated Creatinine Clearance: 11.5 mL/min (by C-G formula based on Cr of 8.47). No results for input(s): VANCOTROUGH, VANCOPEAK, VANCORANDOM, GENTTROUGH, GENTPEAK, GENTRANDOM, TOBRATROUGH, TOBRAPEAK, TOBRARND, AMIKACINPEAK, AMIKACINTROU, AMIKACIN in the last 72 hours.   Microbiology: Recent Results (from the past 720 hour(s))  Anaerobic culture     Status: None   Collection Time: 05/31/14  5:14 PM  Result Value Ref Range Status   Specimen Description WOUND  Final   Special Requests LEFT KNEE  Final   Gram Stain   Final    FEW WBC PRESENT,BOTH PMN AND MONONUCLEAR NO SQUAMOUS EPITHELIAL CELLS SEEN NO ORGANISMS SEEN Performed at [facility]    Culture   Final    NO ANAEROBES ISOLATED Performed at [facility]    Report Status 06/05/2014 FINAL  Final  Fungus Culture with Smear     Status: None (Preliminary result)   Collection Time: 05/31/14  5:14 PM  Result Value Ref Range Status   Specimen Description WOUND  Final   Special Requests LEFT KNEE  Final   Fungal Smear   Final     NO YEAST OR FUNGAL ELEMENTS SEEN Performed at [facility]    Culture   Final    CULTURE IN PROGRESS FOR FOUR WEEKS Performed at [facility]    Report Status PENDING  Incomplete  Tissue culture     Status: None   Collection Time: 05/31/14  5:14 PM  Result Value Ref Range Status   Specimen Description TISSUE  Final   Special Requests LEFT KNEE  Final   Gram Stain   Final    FEW WBC PRESENT,BOTH PMN AND MONONUCLEAR NO SQUAMOUS EPITHELIAL CELLS SEEN NO ORGANISMS SEEN Performed at [facility]    Culture   Final    MODERATE ENTEROCOCCUS SPECIES Performed at [facility]    Report Status 06/03/2014 FINAL  Final   Organism ID, Bacteria ENTEROCOCCUS SPECIES  Final      Susceptibility   Enterococcus species - MIC*    VANCOMYCIN 1 SENSITIVE Sensitive     AMPICILLIN <=2 SENSITIVE Sensitive     * MODERATE ENTEROCOCCUS SPECIES  AFB culture with smear     Status: None (Preliminary result)   Collection Time: 05/31/14  5:14 PM  Result Value Ref Range Status   Specimen Description WOUND  Final   Special Requests LEFT KNEE  Final   Acid Fast Smear   Final    NO ACID FAST BACILLI SEEN Performed at [facility]  Final    CULTURE WILL BE EXAMINED FOR 6 WEEKS BEFORE ISSUING A FINAL REPORT Performed at [facility]    Report Status PENDING  Incomplete  Culture, blood (routine x 2)     Status: None (Preliminary result)   Collection Time: 06/02/14 11:40 AM  Result Value Ref Range Status   Specimen Description BLOOD BLOOD RIGHT ARM  Final   Special Requests BOTTLES DRAWN AEROBIC AND ANAEROBIC  10CC  Final   Culture   Final           BLOOD CULTURE RECEIVED NO GROWTH TO DATE CULTURE WILL BE HELD FOR 5 DAYS BEFORE ISSUING A FINAL NEGATIVE REPORT Performed at [facility]    Report Status PENDING  Incomplete  Culture, blood (routine x 2)     Status: None (Preliminary result)   Collection Time: 06/02/14 11:47 AM   Result Value Ref Range Status   Specimen Description BLOOD BLOOD RIGHT ARM  Final   Special Requests BOTTLES DRAWN AEROBIC AND ANAEROBIC  10CC  Final   Culture   Final           BLOOD CULTURE RECEIVED NO GROWTH TO DATE CULTURE WILL BE HELD FOR 5 DAYS BEFORE ISSUING A FINAL NEGATIVE REPORT Performed at [facility]    Report Status PENDING  Incomplete    Anti-infectives    Start     Dose/Rate Route Frequency Ordered Stop   06/07/14 1200  vancomycin (VANCOCIN) IVPB 1000 mg/200 mL premix  Status:  Discontinued     1,000 mg 200 mL/hr over 60 Minutes Intravenous Every M-W-F (Hemodialysis) 06/04/14 1345 06/07/14 1030   06/05/14 1915  vancomycin (VANCOCIN) 500 mg in sodium chloride 0.9 % 100 mL IVPB     500 mg 100 mL/hr over 60 Minutes Intravenous  Once 06/05/14 1909 06/06/14 0207   06/05/14 1200  vancomycin (VANCOCIN) 500 mg in sodium chloride 0.9 % 100 mL IVPB  Status:  Discontinued     500 mg 100 mL/hr over 60 Minutes Intravenous Every M-W-F (Hemodialysis) 06/04/14 1345 06/05/14 1909   06/03/14 1200  vancomycin (VANCOCIN) IVPB 1000 mg/200 mL premix  Status:  Discontinued     1,000 mg 200 mL/hr over 60 Minutes Intravenous To Hemodialysis 06/02/14 1147 06/03/14 0856   06/03/14 1200  vancomycin (VANCOCIN) IVPB 1000 mg/200 mL premix     1,000 mg 200 mL/hr over 60 Minutes Intravenous To Hemodialysis 06/03/14 0958 06/03/14 1132   06/01/14 1300  vancomycin (VANCOCIN) IVPB 1000 mg/200 mL premix  Status:  Discontinued     1,000 mg 200 mL/hr over 60 Minutes Intravenous  Once 06/01/14 0824 06/01/14 0929   06/01/14 1300  vancomycin (VANCOCIN) 1,750 mg in sodium chloride 0.9 % 250 mL IVPB     1,750 mg 250 mL/hr over 60 Minutes Intravenous  Once 06/01/14 0929 06/01/14 1654   06/01/14 1000  vancomycin (VANCOCIN) IVPB 1000 mg/200 mL premix  Status:  Discontinued     1,000 mg 200 mL/hr over 60 Minutes Intravenous  Once 06/01/14 0809 06/01/14 0824   05/31/14 2200  piperacillin-tazobactam  (ZOSYN) IVPB 2.25 g  Status:  Discontinued     2.25 g 100 mL/hr over 30 Minutes Intravenous 3 times per day 05/31/14 2029 06/03/14 1415   05/31/14 2130  vancomycin (VANCOCIN) IVPB 750 mg/150 ml premix     750 mg 150 mL/hr over 60 Minutes Intravenous  Once 05/31/14 2048 06/01/14 0002   05/31/14 1945  vancomycin (VANCOCIN) IVPB 1000 mg/200 mL premix  Status:  Discontinued     1,000 mg 200 mL/hr over 60 Minutes Intravenous Every 12 hours 05/31/14 1932 05/31/14 2027   05/31/14 1945  piperacillin-tazobactam (ZOSYN) IVPB 3.375 g  Status:  Discontinued     3.375 g 12.5 mL/hr over 240 Minutes Intravenous 4 times per day 05/31/14 1932 05/31/14 2029   05/31/14 1713  gentamicin (GARAMYCIN) injection  Status:  Discontinued       As needed 05/31/14 1713 05/31/14 1800   05/31/14 1713  vancomycin (VANCOCIN) powder  Status:  Discontinued       As needed 05/31/14 1714 05/31/14 1800      Assessment: 54 yo m admitted on 5/20 for I&D after TKA. Pharmacy is consulted to dose vancomycin for enterococcal wound infection.  Patient is ESRD on HD MWF.  He is now off schedule due to infiltrating his IV on 5/25 and received full session instead on 5/26. Vancomycin is off schedule now and duplicate doses were given.  His vancomycin dosing is as follows:  750 mg 5/20 @ 2302 1750 mg 5/21 @ 1554 1000 mg 5/23 @ 1032 (unsure if he got full dose, infiltrated IV) 500 mg 5/26 @ 0107 1000 mg 5/26 @ 1301  Will hold his dose for today (not going to HD) and check a random level tomorrow AM to determine further dosing. Wbc wnl, afebrile.   Pharmacy is also consulted to dose warfarin for VTE prophylaxis.  INR this AM is 2.16.  Hgb 8.7, plts 404, no bleeding or issues noted.  Goal of Therapy:  INR 2-3 Pre-HD vancomycin level 15-25 mcg/mL  Plan:  Hold vancomycin today 5/27 F/u 0500 VR tomorrow 5/28 Warfarin 4 mg x 1 tonight F/u INR in AM to determine further dosing Daily INR Monitor HD schedule, CBC, hgb/plts, s/s of  bleeding, clinical course  Quinesha Selinger L. Nicole Kindred, PharmD Clinical Pharmacy Resident Pager: [phone] 06/07/2014 10:44 AM

## 2014-06-07 NOTE — Progress Notes (Signed)
Subjective:   Refusing SNF, adamant he wants to go home and will get to nocturnal HD.  Objective Filed Vitals:   06/06/14 1358 06/06/14 1414 06/06/14 2044 06/07/14 0600  BP: 140/72 131/74 128/73 129/71  Pulse: 86 85 103 82  Temp: 98.2 F (36.8 C) 98.4 F (36.9 C)  98.4 F (36.9 C)  TempSrc: Oral Oral Oral   Resp: 18 18  16   Height:      Weight: 93.3 kg (205 lb 11 oz)     SpO2: 98% 97% 96% 96%   Physical Exam General: alert and oriented. No acute distress  Heart: RRR Lungs: CTA, unlabored  Abdomen: soft, nontender +BS  Extremities: L knee dressing intact, old drainage. LLE chronic edema. Dialysis Access:  L AVF +b/t   Dialysis Orders: Center:[facility] - Nocturnal EDW 92 - just lowered from 97!!- had been leaving below EDW even with missing HD treatments.MWF 8 hours 300/500 2K 2 Ca left upper AVF heparin 4000 with 5 K mid tmt Mircera 225 q two weeks- ordered to start 5/11 but none given, venofer 50/week- given 5/18 calcitriol 2 Recent labs: Hgb 9.6 5/13   Assessment/Plan: 1. Recurrent wound dehiscence of LTKR initially done 3/30 with enterococcus tissue culture 5/20 following tramatic fall; s/p replacement of Polyethylene trade, antibiotic beads 5/20 Dr. Sharol Given; IV antibiotics narrowed to Vanc- ID recommends 6 weeks then 5 months of Augmentin.Blood cultures no growth 2. ESRD - MWF nocturnal - HD tomorrow if still here. will run at 4 hours at higher BFR/DFR; Cont to have trouble w/ infiltration, not big issue as an outpt.if DCd will go to HD tonight 3. Hypertension/volume - 129/71 Cont to challenge EDW as tolerated 4. Anemia - Hgb 7.9 up from 7.2 after one unit of blood; Aranesp 150 per week - given 5/23 - will redose again on Friday 5. Metabolic bone disease - sensipar 30 , iPTH 168 05/2013 corr Ca 11 - use 2 Ca bath - calcitriol on hold. ; P 4.1- needs nonCa+ binder- change to renvela  6. Nutrition - renal diet + vitamin; having progressive weight loss alb 1.9 - renal carb  mod diet - added prostat 7. DM - BS ok 8. Dialysis noncompliance - needs to stay on full treatments AND not skip treatments; he is considering PD, but this would be contraindicated until infection has resolved and attendance is good.- we discussed this  9. VTE prophylaxis - started on coumadin - INR 2.16 -per Campbell Riches, NP [facility] [phone] 06/07/2014,10:21 AM  LOS: 7 days    Additional Objective Labs: Basic Metabolic Panel:  Recent Labs Lab 05/31/14 2034 06/05/14 1049 06/06/14 0945  NA 139 138 135  K 5.0 4.2 4.2  CL 106 99* 97*  CO2 20* 25 24  GLUCOSE 91 82 107*  BUN 41* 37* 44*  CREATININE 7.21* 7.70* 8.47*  CALCIUM 8.8* 9.8 9.3  PHOS 5.9* 4.2 4.1   Liver Function Tests:  Recent Labs Lab 05/31/14 1510 05/31/14 2034 06/05/14 1049 06/06/14 0945  AST 17  --   --   --   ALT 15*  --   --   --   ALKPHOS 87  --   --   --   BILITOT 1.0  --   --   --   PROT 7.8  --   --   --   ALBUMIN 2.6* 2.1* 2.0* 1.9*   No results for input(s): LIPASE, AMYLASE in the last 168 hours. CBC:  Recent  Labs Lab 06/04/14 0620 06/05/14 0449 06/05/14 1049 06/06/14 0900 06/07/14 0445  WBC 8.6 8.3 9.1 8.0 7.9  HGB 7.9* 6.9* 7.8* 8.0* 8.7*  HCT 25.6* 22.1* 24.6* 24.8* 27.8*  MCV 86.8 86.0 85.1 84.9 86.6  PLT 339 328 386 359 404*   Blood Culture    Component Value Date/Time   SDES BLOOD BLOOD RIGHT ARM 06/02/2014 1147   SPECREQUEST BOTTLES DRAWN AEROBIC AND ANAEROBIC  10CC 06/02/2014 1147   CULT  06/02/2014 1147           BLOOD CULTURE RECEIVED NO GROWTH TO DATE CULTURE WILL BE HELD FOR 5 DAYS BEFORE ISSUING A FINAL NEGATIVE REPORT Performed at [facility] PENDING 06/02/2014 1147    Cardiac Enzymes: No results for input(s): CKTOTAL, CKMB, CKMBINDEX, TROPONINI in the last 168 hours. CBG:  Recent Labs Lab 06/06/14 1447 06/06/14 1634 06/06/14 2114 06/07/14 0705 06/07/14 0728  GLUCAP 95 147* 230* 62* 73    Iron Studies: No results for input(s): IRON, TIBC, TRANSFERRIN, FERRITIN in the last 72 hours. @lablastinr3 @ Studies/Results: No results found. Medications:   . sodium chloride   Intravenous Once  . sodium chloride   Intravenous Once  . amitriptyline  50 mg Oral QHS  . amLODipine  5 mg Oral Daily  . calcium acetate  1,334 mg Oral TID WC  . cinacalcet  30 mg Oral Q supper  . darbepoetin (ARANESP) injection - DIALYSIS  150 mcg Intravenous Q Fri-HD  . famotidine  10 mg Oral Daily  . feeding supplement (PRO-STAT SUGAR FREE 64)  30 mL Oral BID  . gabapentin  200 mg Oral Daily  . glipiZIDE  5 mg Oral QAC breakfast  . insulin aspart  0-9 Units Subcutaneous TID WC  . multivitamin  1 tablet Oral QHS  . vancomycin  1,000 mg Intravenous Q M,W,F-HD  . warfarin   Does not apply Once  . Warfarin - Pharmacist Dosing Inpatient   Does not apply [phone]

## 2014-06-07 NOTE — Progress Notes (Signed)
[name], male   DOB: August 27, 1960, 54 y.o.   MRN: [id number] I had a long discussion with the patient regarding the importance of him working on knee extension. Patient states he understands. The dressing is clean and dry. Patient states he is going through some social issues at home and may need to go home rather than to rehabilitation. Discussed that I was concerned that this was a safety issue but patient states that he may elect to be discharged to home receive his IV antibiotics with his dialysis and go to outpatient therapy. Patient states he will determine his course of action later today. Would prefer patient go to skilled nursing for supervised therapy.

## 2014-06-07 NOTE — Progress Notes (Signed)
CBG 114 

## 2014-06-08 LAB — PROTIME-INR
INR: 2.63 — AB (ref 0.00–1.49)
PROTHROMBIN TIME: 27.7 s — AB (ref 11.6–15.2)

## 2014-06-08 LAB — CULTURE, BLOOD (ROUTINE X 2)
Culture: NO GROWTH
Culture: NO GROWTH

## 2014-06-08 LAB — RENAL FUNCTION PANEL
ANION GAP: 14 (ref 5–15)
Albumin: 2 g/dL — ABNORMAL LOW (ref 3.5–5.0)
BUN: 30 mg/dL — AB (ref 6–20)
CHLORIDE: 96 mmol/L — AB (ref 101–111)
CO2: 27 mmol/L (ref 22–32)
Calcium: 8.8 mg/dL — ABNORMAL LOW (ref 8.9–10.3)
Creatinine, Ser: 7.21 mg/dL — ABNORMAL HIGH (ref 0.61–1.24)
GFR calc non Af Amer: 8 mL/min — ABNORMAL LOW (ref >60)
GFR, EST AFRICAN AMERICAN: 9 mL/min — AB (ref >60)
GLUCOSE: 79 mg/dL (ref 65–99)
POTASSIUM: 4.1 mmol/L (ref 3.5–5.1)
Phosphorus: 3.4 mg/dL (ref 2.5–4.6)
Sodium: 137 mmol/L (ref 135–145)

## 2014-06-08 LAB — CBC
HCT: 27.8 % — ABNORMAL LOW (ref 39.0–52.0)
HCT: 27.5 % — ABNORMAL LOW (ref 39.0–52.0)
HEMOGLOBIN: 8.9 g/dL — AB (ref 13.0–17.0)
HEMOGLOBIN: 8.7 g/dL — AB (ref 13.0–17.0)
MCH: 27.8 pg (ref 26.0–34.0)
MCH: 27.4 pg (ref 26.0–34.0)
MCHC: 32 g/dL (ref 30.0–36.0)
MCHC: 31.6 g/dL (ref 30.0–36.0)
MCV: 86.9 fL (ref 78.0–100.0)
MCV: 86.5 fL (ref 78.0–100.0)
PLATELETS: 410 10*3/uL — AB (ref 150–400)
Platelets: 415 10*3/uL — ABNORMAL HIGH (ref 150–400)
RBC: 3.2 MIL/uL — ABNORMAL LOW (ref 4.22–5.81)
RBC: 3.18 MIL/uL — AB (ref 4.22–5.81)
RDW: 15.3 % (ref 11.5–15.5)
RDW: 15.2 % (ref 11.5–15.5)
WBC: 8.7 10*3/uL (ref 4.0–10.5)
WBC: 8.9 10*3/uL (ref 4.0–10.5)

## 2014-06-08 LAB — VANCOMYCIN, RANDOM: Vancomycin Rm: 22 ug/mL

## 2014-06-08 LAB — GLUCOSE, CAPILLARY
Glucose-Capillary: 76 mg/dL (ref 65–99)
Glucose-Capillary: 103 mg/dL — ABNORMAL HIGH (ref 65–99)

## 2014-06-08 MED ORDER — DARBEPOETIN ALFA 150 MCG/0.3ML IJ SOSY
PREFILLED_SYRINGE | INTRAMUSCULAR | Status: AC
  Administered 2014-06-08: 150 ug
  Filled 2014-06-08: qty 0.3

## 2014-06-08 MED ORDER — SODIUM CHLORIDE 0.9 % IV SOLN: 100.0000 mL | INTRAVENOUS | Status: DC | PRN

## 2014-06-08 MED ORDER — HEPARIN SODIUM (PORCINE) 1000 UNIT/ML DIALYSIS: 4000.0000 [IU] | Freq: Once | INTRAMUSCULAR | Status: DC

## 2014-06-08 MED ORDER — ALTEPLASE 2 MG IJ SOLR
2.0000 mg | Freq: Once | INTRAMUSCULAR | Status: DC | PRN
  Filled 2014-06-08: qty 2

## 2014-06-08 MED ORDER — HEPARIN SODIUM (PORCINE) 1000 UNIT/ML DIALYSIS
1000.0000 [IU] | INTRAMUSCULAR | Status: DC | PRN
Start: 2014-06-08 — End: 2014-06-08

## 2014-06-08 MED ORDER — LIDOCAINE-PRILOCAINE 2.5-2.5 % EX CREA
1.0000 "application " | TOPICAL_CREAM | CUTANEOUS | Status: DC | PRN
  Filled 2014-06-08: qty 5

## 2014-06-08 MED ORDER — LIDOCAINE HCL (PF) 1 % IJ SOLN: 5.0000 mL | INTRAMUSCULAR | Status: DC | PRN

## 2014-06-08 MED ORDER — VANCOMYCIN HCL IN DEXTROSE 1-5 GM/200ML-% IV SOLN
1000.0000 mg | Freq: Once | INTRAVENOUS | Status: DC
  Filled 2014-06-08: qty 200

## 2014-06-08 MED ORDER — NEPRO/CARBSTEADY PO LIQD
237.0000 mL | ORAL | Status: DC | PRN
  Filled 2014-06-08: qty 237

## 2014-06-08 MED ORDER — VANCOMYCIN HCL IN DEXTROSE 1-5 GM/200ML-% IV SOLN: 1000.0000 mg | INTRAVENOUS | Status: DC

## 2014-06-08 MED ORDER — PENTAFLUOROPROP-TETRAFLUOROETH EX AERO: 1.0000 "application " | INHALATION_SPRAY | CUTANEOUS | Status: DC | PRN

## 2014-06-08 NOTE — Progress Notes (Signed)
ANTIBIOTIC AND ANTICOAGULATION CONSULT NOTE - FOLLOW UP  Pharmacy Consult for vancomycin/warfarin Indication: wound infection/VTE prophylaxis  Allergies  Allergen Reactions  . Morphine And Related     hallucinations  . Tygacil [Tigecycline] Nausea And Vomiting, Nausea Only and Other (See Comments)    Patient Measurements: Height: 5\' 11"  (180.3 cm) Weight: 195 lb 1.7 oz (88.5 kg) IBW/kg (Calculated) : 75.3  Vital Signs: Temp: 91.8 F (33.2 C) (05/28 0728) Temp Source: Oral (05/28 0728) BP: 96/58 mmHg (05/28 1158) Pulse Rate: 87 (05/28 1158) Intake/Output from previous day: 05/27 0701 - 05/28 0700 In: 720 [P.O.:720] Out: 200 [Urine:200] Intake/Output from this shift: Total I/O In: -  Out: 3318 [id number]  Labs:  Recent Labs  06/06/14 0945 06/07/14 0445 06/08/14 0453 06/08/14 0811 06/08/14 0813  WBC  --  7.9 8.7  --  8.9  HGB  --  8.7* 8.9*  --  8.7*  PLT  --  404* 410*  --  415*  CREATININE 8.47*  --   --  7.21*  --    Estimated Creatinine Clearance: 12.3 mL/min (by C-G formula based on Cr of 7.21).  Recent Labs  06/08/14 0453  [facility] 22     Microbiology: Recent Results (from the past 720 hour(s))  Anaerobic culture     Status: None   Collection Time: 05/31/14  5:14 PM  Result Value Ref Range Status   Specimen Description WOUND  Final   Special Requests LEFT KNEE  Final   Gram Stain   Final    FEW WBC PRESENT,BOTH PMN AND MONONUCLEAR NO SQUAMOUS EPITHELIAL CELLS SEEN NO ORGANISMS SEEN Performed at [facility]    Culture   Final    NO ANAEROBES ISOLATED Performed at [facility]    Report Status 06/05/2014 FINAL  Final  Fungus Culture with Smear     Status: None (Preliminary result)   Collection Time: 05/31/14  5:14 PM  Result Value Ref Range Status   Specimen Description WOUND  Final   Special Requests LEFT KNEE  Final   Fungal Smear   Final    NO YEAST OR FUNGAL ELEMENTS SEEN Performed at [facility]    Culture   Final    CULTURE IN PROGRESS FOR FOUR WEEKS Performed at [facility]    Report Status PENDING  Incomplete  Tissue culture     Status: None   Collection Time: 05/31/14  5:14 PM  Result Value Ref Range Status   Specimen Description TISSUE  Final   Special Requests LEFT KNEE  Final   Gram Stain   Final    FEW WBC PRESENT,BOTH PMN AND MONONUCLEAR NO SQUAMOUS EPITHELIAL CELLS SEEN NO ORGANISMS SEEN Performed at [facility]    Culture   Final    MODERATE ENTEROCOCCUS SPECIES Performed at [facility]    Report Status 06/03/2014 FINAL  Final   Organism ID, Bacteria ENTEROCOCCUS SPECIES  Final      Susceptibility   Enterococcus species - MIC*    VANCOMYCIN 1 SENSITIVE Sensitive     AMPICILLIN <=2 SENSITIVE Sensitive     * MODERATE ENTEROCOCCUS SPECIES  AFB culture with smear     Status: None (Preliminary result)   Collection Time: 05/31/14  5:14 PM  Result Value Ref Range Status   Specimen Description WOUND  Final   Special Requests LEFT KNEE  Final   Acid Fast Smear   Final    NO ACID FAST BACILLI SEEN Performed at  [facility]   Final    CULTURE WILL BE EXAMINED FOR 6 WEEKS BEFORE ISSUING A FINAL REPORT Performed at [facility]    Report Status PENDING  Incomplete  Culture, blood (routine x 2)     Status: None   Collection Time: 06/02/14 11:40 AM  Result Value Ref Range Status   Specimen Description BLOOD BLOOD RIGHT ARM  Final   Special Requests BOTTLES DRAWN AEROBIC AND ANAEROBIC  10CC  Final   Culture   Final    NO GROWTH 5 DAYS Performed at [facility]    Report Status 06/08/2014 FINAL  Final  Culture, blood (routine x 2)     Status: None   Collection Time: 06/02/14 11:47 AM  Result Value Ref Range Status   Specimen Description BLOOD BLOOD RIGHT ARM  Final   Special Requests BOTTLES DRAWN AEROBIC AND ANAEROBIC  10CC  Final   Culture   Final    NO GROWTH 5 DAYS Performed at [facility]    Report Status 06/08/2014 FINAL  Final     Assessment: 54 yo m admitted on 5/20 for I&D after TKA. Pharmacy consulted to dose vancomycin for enterococcal wound infection.  Patient is ESRD on HD MWF.  He is now off schedule due to infiltrating his IV on 5/25 and received full session instead on 5/26. Vancomycin is off schedule now and duplicate doses were given.  His vancomycin dosing is as follows:  750 mg 5/20 @ 2302 1750 mg 5/21 @ 1554 1000 mg 5/23 @ 1032 (unsure if he got full dose, infiltrated IV) 500 mg 5/26 @ 0107 1000 mg 5/26 @ 1301  5/24 VR = 30 5/28 VR = 22 at 0500, s/p HD this AM  Tissue Cx >> Mod Enterococcus (s to amp and vanc) Blood >> ngF Fungus >> pend AFB >> pend  5/20 vanc > (con't x 6 weeks) 5/20 zosyn >5/23  Pharmacy is also consulted to dose warfarin for VTE prophylaxis.  INR this AM is therapeutic at 2.63.  Hgb 8.7, plts 415, no bleeding or issues noted.  Goal of Therapy:  INR 2-3 Pre-HD vancomycin level 15-25 mcg/mL  Plan:  -vancomycin 1 gm IV x 1 dose today, then vancomycin 1 gm IV after HD on MWF - no coumadin ordered on discharge summary, so no further coumadin dosing required  Eudelia Bunch, Pharm.D. [id number] 06/08/2014 1:43 PM

## 2014-06-08 NOTE — Progress Notes (Signed)
PT Cancellation Note  Patient Details Name: Matthew Kline MRN: [id number] DOB: June 16, 1960   Cancelled Treatment:    Reason Eval/Treat Not Completed: Patient at procedure or test/unavailable (Pt in HD.) Will try again later.   [name] 06/08/2014, 8:25 AM Suanne Marker PT [phone]

## 2014-06-08 NOTE — Care Management Note (Signed)
Case Management Note  Patient Details  Name: Matthew Kline MRN: [id number] Date of Birth: 1960-07-03  Subjective/Objective:  Per Lynn Ito, RN, pt is going home today with his wife and [facility] with [facility]. Met with pt. D/C plan is to return home with the support of his wife. Contacted Stephanie at [facility] and made her aware that pt is being d/c today.                  Action/Plan:   Expected Discharge Date:  06/04/14               Expected Discharge Plan:  [place]  In-House Referral:  Clinical Social Work  Discharge planning Services  CM Consult  Post Acute Care Choice:  Home Health Choice offered to:  Patient  DME Arranged:  N/A DME Agency:     HH Arranged:  RN, PT [place] Agency:  [facility]  Status of Service:  In process, will continue to follow  Medicare Important Message Given:  Yes Date Medicare IM Given:  06/04/14 Medicare IM give by:  Marylyn Ishihara, RN Date Additional Medicare IM Given:    Additional Medicare Important Message give by:     If discussed at [place] of Stay Meetings, dates discussed:    Additional Comments:  Norina Buzzard, RN 06/08/2014, 12:55 PM

## 2014-06-08 NOTE — Discharge Summary (Signed)
Physician Discharge Summary  [name] MRN: [id number] DOB/AGE: [birth year] 54 y.o.  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  Discharge Diagnoses:  Active Problems:   Infection of total knee replacement   Discharged Condition: stable  Hospital Course: Patient's hospital course was essentially unremarkable. Patient progressed slowly with physical therapy. I had recommended with the patient discharged to skilled nursing and patient refused discharge to skilled nursing. He feels he is safe to be discharged to home today. Patient is currently in dialysis.  Consults: nephrology  Significant Diagnostic Studies: labs: Routine labs  Treatments: dialysis: Hemodialysis and surgery: See operative note  Discharge Exam: Blood pressure 103/68, pulse 91, temperature 91.8 F (33.2 C), temperature source Oral, resp. rate 15, height 5\' 11"  (1.803 m), weight 91.8 kg (202 lb 6.1 oz), SpO2 98 %. Incision/Wound: incision clean and dry  Disposition: 01-Home or Self Care  Discharge Instructions    Call MD / Call 911    Complete by:  As directed   If you experience chest pain or shortness of breath, CALL 911 and be transported to the hospital emergency room.  If you develope a fever above 101 F, pus (white drainage) or increased drainage or redness at the wound, or calf pain, call your surgeon's office.     Constipation Prevention    Complete by:  As directed   Drink plenty of fluids.  Prune juice may be helpful.  You may use a stool softener, such as Colace (over the counter) 100 mg twice a day.  Use MiraLax (over the counter) for constipation as needed.     Diet - low sodium heart healthy    Complete by:  As directed      Increase activity slowly as tolerated    Complete by:  As directed             Medication List     TAKE these medications        amitriptyline 50 MG tablet  Commonly known as:  ELAVIL  Take 1 tablet (50 mg total) by mouth at bedtime.     amLODipine 5 MG tablet  Commonly known as:  NORVASC  Take 1 tablet by mouth daily.     calcium acetate 667 MG capsule  Commonly known as:  PHOSLO  Take 667 mg by mouth 3 (three) times daily with meals.     gabapentin 100 MG capsule  Commonly known as:  NEURONTIN  Take 2 capsules (200 mg total) by mouth 2 (two) times daily.     glipiZIDE 5 MG tablet  Commonly known as:  GLUCOTROL  Take 1 tablet (5 mg total) by mouth daily before breakfast.     HYDROcodone-acetaminophen 5-325 MG per tablet  Commonly known as:  NORCO/VICODIN  Take 1 tablet by mouth every 4 (four) hours as needed for moderate pain.     ranitidine 150 MG tablet  Commonly known as:  ZANTAC  Take 150 mg by mouth daily as needed for heartburn.     sildenafil 25 MG tablet  Commonly known as:  VIAGRA  Take 2 tablets (50 mg total) by mouth daily as needed for erectile dysfunction.     VITAMIN D PO  Take 1 tablet by mouth 3 (three) times a week. At dialysis-MWF     zolpidem 10 MG tablet  Commonly known as:  AMBIEN  Take 1 tablet (10  mg total) by mouth at bedtime as needed for sleep.           Follow-up Information    Follow up with [facility].   Why:  Someone from [facility] will contact you concerning start date and time for Home Health RN and therapist.   Contact information:   [address] [place] [address] [phone]       Follow up with DUDA,MARCUS V, MD In 3 weeks.   Specialty:  Orthopedic Surgery   Contact information:   [place] [address] [phone]       Signed: [name] 06/08/2014, 10:10 AM

## 2014-06-08 NOTE — Procedures (Signed)
I was present at this dialysis session. I have reviewed the session itself and made appropriate changes.   Disposition still unclear.  Tolerating HD, Goal UF 4L. AVF.  Pearson Grippe  MD 06/08/2014, 8:43 AM

## 2014-06-08 NOTE — Progress Notes (Signed)
CBG 76 

## 2014-06-12 LAB — HIV ANTIBODY (ROUTINE TESTING W REFLEX): HIV SCREEN 4TH GENERATION: NONREACTIVE

## 2014-06-13 ENCOUNTER — Encounter (HOSPITAL_COMMUNITY): Payer: Self-pay | Admitting: Orthopedic Surgery

## 2014-06-26 ENCOUNTER — Encounter: Payer: Self-pay | Admitting: Internal Medicine

## 2014-06-26 ENCOUNTER — Ambulatory Visit (INDEPENDENT_AMBULATORY_CARE_PROVIDER_SITE_OTHER): Payer: Commercial Managed Care - HMO | Admitting: Internal Medicine

## 2014-06-26 VITALS — BP 118/70 | HR 106 | Temp 98.9°F | Resp 18 | Ht 71.0 in | Wt 203.4 lb

## 2014-06-26 DIAGNOSIS — T8459XD Infection and inflammatory reaction due to other internal joint prosthesis, subsequent encounter: Secondary | ICD-10-CM

## 2014-06-26 DIAGNOSIS — F32A Depression, unspecified: Secondary | ICD-10-CM

## 2014-06-26 DIAGNOSIS — R000 Tachycardia, unspecified: Secondary | ICD-10-CM | POA: Diagnosis not present

## 2014-06-26 DIAGNOSIS — F329 Major depressive disorder, single episode, unspecified: Secondary | ICD-10-CM | POA: Diagnosis not present

## 2014-06-26 DIAGNOSIS — T8450XD Infection and inflammatory reaction due to unspecified internal joint prosthesis, subsequent encounter: Secondary | ICD-10-CM | POA: Diagnosis not present

## 2014-06-26 DIAGNOSIS — N186 End stage renal disease: Secondary | ICD-10-CM | POA: Diagnosis not present

## 2014-06-26 DIAGNOSIS — Z96659 Presence of unspecified artificial knee joint: Secondary | ICD-10-CM

## 2014-06-26 DIAGNOSIS — L821 Other seborrheic keratosis: Secondary | ICD-10-CM | POA: Diagnosis not present

## 2014-06-26 DIAGNOSIS — E1129 Type 2 diabetes mellitus with other diabetic kidney complication: Secondary | ICD-10-CM

## 2014-06-26 DIAGNOSIS — E1121 Type 2 diabetes mellitus with diabetic nephropathy: Secondary | ICD-10-CM

## 2014-06-26 DIAGNOSIS — I10 Essential (primary) hypertension: Secondary | ICD-10-CM

## 2014-06-26 NOTE — Progress Notes (Signed)
[name], male   DOB: 06-16-1960, 54 y.o.   MRN: [id number]    Location:    [facility]   Place of Service:   OFFICE  Chief Complaint  Patient presents with  . Medical Management of Chronic Issues    HPI:  54 yo male seen today for f/u. He was admitted into the hospital since his last visit. He fell on April 28th and reopened left knee surgical incision. He had to have another procedure to close it. The site then got infected and had to be I&D followed by repairing the TKR casing. He had a drain that he accidentally pulled out in the middle of the night and incision abx beads had to be reinserted. He is still receiving IV vanco with HD. He maintains a low grade temp (98.9-99.1) since infection began several weeks ago. He has noticed some weight loss (15 lbs since April 15th OV). No change in diet. He is following PT exercises but no moderate aerobic exercise. No appetite  Mood stable on elavil. He takes ambien to help him sleep  BS 100-110s. No low BS reactions. He is taking glipizide. Neuropathy stable on gabapentin  BP controlled on amlodipine.  Pain is controlled on norco  He has ESRD/HD 3 times a week via left arm AVF  Past Medical History  Diagnosis Date  . Polymyalgia rheumatica   . Hypertension   . Diabetic neuropathy   . Osteomyelitis of foot, left, acute   . Anxiety   . Insomnia, unspecified   . Unspecified vitamin D deficiency   . Anemia, unspecified   . Other chronic postoperative pain   . Unspecified hereditary and idiopathic peripheral neuropathy   . Allergy   . Unspecified osteomyelitis, site unspecified   . Long term (current) use of anticoagulants   . 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 Ulcer     diabetic foot   . Arthralgia 2010    polyarticular  . Hemorrhoids, internal 10/2011    small  . CHF (congestive heart failure)   . CHF (congestive heart failure) 07/25/2009    denies  . Hemodialysis access site with mature fistula   .  Myocardial infarction 1995  . Pneumonia     "probably 4-5 times" (05/09/2014)  . Sleep apnea     "lost weight; no more problem" (05/09/2014)  . Type II diabetes mellitus dx'd 1995  . History of blood transfusion     "related to the anemia"  . GERD (gastroesophageal reflux disease)     hx "before I lost weight"  . ESRD (end stage renal disease) on dialysis     started 12/2012; "MWF; [facility]"  . Stroke 01/10/06    denies residual on 05/09/2014  . Arthritis     "back, knees" (05/09/2014)  . Chronic lower back pain     Past Surgical History  Procedure Laterality Date  . Amputation  01/21/2012    Procedure: AMPUTATION RAY;  Surgeon: [name], MD;  Location: [place];  Service: Orthopedics;  Laterality: Left;  Left Foot 4th Ray Amputation  . Anterior cervical decomp/discectomy fusion  02/2011  . Knee arthroscopy Left 08-25-2012  . Toe amputation Bilateral     "I've lost 7 toes over the last 7 years" (05/09/2014)  . Refractive surgery Bilateral   . Bascilic vein transposition Left 10/19/2012    Procedure: BASCILIC VEIN TRANSPOSITION;  Surgeon: Serafina Mitchell, MD;  Location: [place];  Service: Vascular;  Laterality:  Left;  . Tonsillectomy    . Amputation Left 05/04/2013    Procedure: AMPUTATION DIGIT;  Surgeon: [name], MD;  Location: [place];  Service: Orthopedics;  Laterality: Left;  Left Great Toe Amputation at MTP  . Toe surgery Left April 2015    Big toe removed on left foot.  . Total knee arthroplasty Left 04/10/2014    Procedure: TOTAL KNEE ARTHROPLASTY;  Surgeon: [name], MD;  Location: [place];  Service: Orthopedics;  Laterality: Left;  . Wound debridement Left 05/09/2014    Dehiscence Left Total Knee Arthroplasty Incision  . Back surgery    . Uvulopalatopharyngoplasty, tonsillectomy and septoplasty  ~ 1989  . I&d extremity Left 05/09/2014    Procedure: Irrigation and Debridement Left Knee and Closure of Total Knee Arthroplasty Incision;  Surgeon: [name], MD;   Location: [place];  Service: Orthopedics;  Laterality: Left;  . I&d knee with poly exchange Left 05/31/2014    Procedure: IRRIGATION AND DEBRIDEMENT LEFT KNEE, PLACE ANTIBIOTIC BEADS,  POLY EXCHANGE;  Surgeon: [name], MD;  Location: [place];  Service: Orthopedics;  Laterality: Left;    Patient Care Team: Gildardo Cranker, DO as PCP - General (Internal Medicine)  History   Social History  . Marital Status: Married    Spouse Name: N/A  . Number of Children: N/A  . Years of Education: N/A   Occupational History  . Not on file.   Social History Main Topics  . Smoking status: Current Some Day Smoker -- 0.12 packs/day for 32 years    Types: Cigarettes  . Smokeless tobacco: Never Used  . Alcohol Use: No  . Drug Use: No  . Sexual Activity: Yes   Other Topics Concern  . Not on file   Social History Narrative     reports that he has been smoking Cigarettes.  He has a 3.84 pack-year smoking history. He has never used smokeless tobacco. He reports that he does not drink alcohol or use illicit drugs.  Allergies  Allergen Reactions  . Morphine And Related     hallucinations  . Tygacil [Tigecycline] Nausea And Vomiting, Nausea Only and Other (See Comments)    Medications: Patient's Medications  New Prescriptions   No medications on file  Previous Medications   AMITRIPTYLINE (ELAVIL) 50 MG TABLET    Take 1 tablet (50 mg total) by mouth at bedtime.   AMLODIPINE (NORVASC) 5 MG TABLET    Take 1 tablet by mouth daily.   CALCIUM ACETATE (PHOSLO) 667 MG CAPSULE    Take 667 mg by mouth 3 (three) times daily with meals.   CHOLECALCIFEROL (VITAMIN D PO)    Take 1 tablet by mouth 3 (three) times a week. At dialysis-MWF   GABAPENTIN (NEURONTIN) 100 MG CAPSULE    Take 2 capsules (200 mg total) by mouth 2 (two) times daily.   GLIPIZIDE (GLUCOTROL) 5 MG TABLET    Take 1 tablet (5 mg total) by mouth daily before breakfast.   HYDROCODONE-ACETAMINOPHEN (NORCO/VICODIN) 5-325 MG PER TABLET    Take 1  tablet by mouth every 4 (four) hours as needed for moderate pain.    RANITIDINE (ZANTAC) 150 MG TABLET    Take 150 mg by mouth daily as needed for heartburn.    SILDENAFIL (VIAGRA) 25 MG TABLET    Take 2 tablets (50 mg total) by mouth daily as needed for erectile dysfunction.   ZOLPIDEM (AMBIEN) 10 MG TABLET    Take 1 tablet (10  mg total) by mouth at bedtime as needed for sleep.  Modified Medications   No medications on file  Discontinued Medications   No medications on file    Review of Systems  Constitutional: Positive for fever and fatigue. Negative for chills and activity change.  HENT: Negative for sore throat and trouble swallowing.   Eyes: Negative for visual disturbance.  Respiratory: Negative for cough, chest tightness and shortness of breath.   Cardiovascular: Positive for leg swelling. Negative for chest pain and palpitations.  Gastrointestinal: Negative for nausea, vomiting, abdominal pain and blood in stool.  Genitourinary: Negative for urgency, frequency and difficulty urinating.  Musculoskeletal: Positive for arthralgias and gait problem.  Skin: Positive for color change. Negative for rash.  Neurological: Negative for weakness and headaches.  Psychiatric/Behavioral: Negative for confusion and sleep disturbance. The patient is not nervous/anxious.     Filed Vitals:   06/26/14 0922  BP: 118/70  Pulse: 106  Temp: 98.9 F (37.2 C)  TempSrc: Oral  Resp: 18  Height: 5' 11" (1.803 m)  Weight: 203 lb 6.4 oz (92.262 kg)  SpO2: 95%   Body mass index is 28.38 kg/(m^2).  Physical Exam  Constitutional: He is oriented to person, place, and time. He appears well-developed and well-nourished.  HENT:  Mouth/Throat: Oropharynx is clear and moist.  Eyes: Pupils are equal, round, and reactive to light. No scleral icterus.  Neck: Neck supple. Carotid bruit is not present. No thyromegaly present.  Cardiovascular: Regular rhythm, normal heart sounds and intact distal pulses.   Tachycardia present.  Exam reveals no gallop and no friction rub.   No murmur heard. (+)LLE +1 pitting edema. No calf TTP. No RLE edema. Left arm AVF with palpable thrill and audible bruit  Pulmonary/Chest: Effort normal and breath sounds normal. He has no wheezes. He has no rales. He exhibits no tenderness.  Abdominal: Soft. Bowel sounds are normal. He exhibits no distension, no abdominal bruit, no pulsatile midline mass and no mass. There is no tenderness. There is no rebound and no guarding.  Musculoskeletal: He exhibits edema and tenderness.  Left knee swelling with reduced ROM and TTP. (+) increased warmth to touch. Gait antalgic  Lymphadenopathy:    He has no cervical adenopathy.  Neurological: He is alert and oriented to person, place, and time. He has normal reflexes.  Skin: Skin is warm and dry. No rash noted.  Left forearm x 2 tiny brown raised areas but no redness or vesicular formation. NT  Psychiatric: He has a normal mood and affect. His speech is normal and behavior is normal. Judgment and thought content normal.     Labs reviewed: Admission on 05/31/2014, Discharged on 06/08/2014  No results displayed because visit has over 200 results.    Admission on 05/09/2014, Discharged on 05/10/2014  Component Date Value Ref Range Status  . WBC 05/09/2014 6.9  4.0 - 10.5 K/uL Final  . RBC 05/09/2014 3.78* 4.22 - 5.81 MIL/uL Final  . Hemoglobin 05/09/2014 10.6* 13.0 - 17.0 g/dL Final  . HCT 05/09/2014 34.1* 39.0 - 52.0 % Final  . MCV 05/09/2014 90.2  78.0 - 100.0 fL Final  . MCH 05/09/2014 28.0  26.0 - 34.0 pg Final  . MCHC 05/09/2014 31.1  30.0 - 36.0 g/dL Final  . RDW 05/09/2014 14.9  11.5 - 15.5 % Final  . Platelets 05/09/2014 248  150 - 400 K/uL Final  . Neutrophils Relative % 05/09/2014 51  43 - 77 % Final  . Neutro Abs 05/09/2014 3.6  1.7 - 7.7 K/uL Final  . Lymphocytes Relative 05/09/2014 31  12 - 46 % Final  . Lymphs Abs 05/09/2014 2.2  0.7 - 4.0 K/uL Final  . Monocytes  Relative 05/09/2014 14* 3 - 12 % Final  . Monocytes Absolute 05/09/2014 1.0  0.1 - 1.0 K/uL Final  . Eosinophils Relative 05/09/2014 3  0 - 5 % Final  . Eosinophils Absolute 05/09/2014 0.2  0.0 - 0.7 K/uL Final  . Basophils Relative 05/09/2014 1  0 - 1 % Final  . Basophils Absolute 05/09/2014 0.1  0.0 - 0.1 K/uL Final  . Sodium 05/09/2014 137  135 - 145 mmol/L Final  . Potassium 05/09/2014 4.8  3.5 - 5.1 mmol/L Final  . Chloride 05/09/2014 100  96 - 112 mmol/L Final  . BUN 05/09/2014 11  6 - 23 mg/dL Final  . Creatinine, Ser 05/09/2014 3.10* 0.50 - 1.35 mg/dL Final  . Glucose, Bld 05/09/2014 121* 70 - 99 mg/dL Final  . Calcium, Ion 05/09/2014 1.13  1.12 - 1.23 mmol/L Final  . TCO2 05/09/2014 24  0 - 100 mmol/L Final  . Hemoglobin 05/09/2014 11.2* 13.0 - 17.0 g/dL Final  . HCT 05/09/2014 33.0* 39.0 - 52.0 % Final  . Glucose-Capillary 05/09/2014 104* 70 - 99 mg/dL Final  . Comment 1 05/09/2014 Notify RN   Final  . Comment 2 05/09/2014 Document in Chart   Final  . Glucose-Capillary 05/10/2014 97  70 - 99 mg/dL Final  . Comment 1 05/10/2014 Notify RN   Final  . Glucose-Capillary 05/10/2014 126* 70 - 99 mg/dL Final  . Comment 1 05/10/2014 Notify RN   Final  Admission on 04/10/2014, Discharged on 04/13/2014  Component Date Value Ref Range Status  . Sodium 04/10/2014 140  135 - 145 mmol/L Final  . Potassium 04/10/2014 4.3  3.5 - 5.1 mmol/L Final  . Glucose, Bld 04/10/2014 114* 70 - 99 mg/dL Final  . HCT 04/10/2014 36.0* 39.0 - 52.0 % Final  . Hemoglobin 04/10/2014 12.2* 13.0 - 17.0 g/dL Final  . Glucose-Capillary 04/10/2014 94  70 - 99 mg/dL Final  . Comment 1 04/10/2014 Notify RN   Final  . Comment 2 04/10/2014 Document in Chart   Final  . WBC 04/10/2014 9.7  4.0 - 10.5 K/uL Final  . RBC 04/10/2014 3.56* 4.22 - 5.81 MIL/uL Final  . Hemoglobin 04/10/2014 10.3* 13.0 - 17.0 g/dL Final  . HCT 04/10/2014 31.9* 39.0 - 52.0 % Final  . MCV 04/10/2014 89.6  78.0 - 100.0 fL Final  . MCH  04/10/2014 28.9  26.0 - 34.0 pg Final  . MCHC 04/10/2014 32.3  30.0 - 36.0 g/dL Final  . RDW 04/10/2014 13.8  11.5 - 15.5 % Final  . Platelets 04/10/2014 182  150 - 400 K/uL Final  . Sodium 04/10/2014 139  135 - 145 mmol/L Final  . Potassium 04/10/2014 4.3  3.5 - 5.1 mmol/L Final  . Chloride 04/10/2014 101  96 - 112 mmol/L Final  . CO2 04/10/2014 25  19 - 32 mmol/L Final  . Glucose, Bld 04/10/2014 147* 70 - 99 mg/dL Final  . BUN 04/10/2014 32* 6 - 23 mg/dL Final  . Creatinine, Ser 04/10/2014 5.40* 0.50 - 1.35 mg/dL Final  . Calcium 04/10/2014 8.8  8.4 - 10.5 mg/dL Final  . GFR calc non Af Amer 04/10/2014 11* >90 mL/min Final  . GFR calc Af Amer 04/10/2014 12* >90 mL/min Final   Comment: (NOTE) The eGFR has been calculated using the CKD  EPI equation. This calculation has not been validated in all clinical situations. eGFR's persistently <90 mL/min signify possible Chronic Kidney Disease.   . Anion gap 04/10/2014 13  5 - 15 Final  . Glucose-Capillary 04/10/2014 190* 70 - 99 mg/dL Final  . Comment 1 04/10/2014 Notify RN   Final  . Glucose-Capillary 04/11/2014 128* 70 - 99 mg/dL Final  . Comment 1 04/11/2014 Notify RN   Final  . Glucose-Capillary 04/11/2014 109* 70 - 99 mg/dL Final  . Comment 1 04/11/2014 Notify RN   Final  . Glucose-Capillary 04/11/2014 88  70 - 99 mg/dL Final  . Glucose-Capillary 04/11/2014 123* 70 - 99 mg/dL Final  . Glucose-Capillary 04/12/2014 103* 70 - 99 mg/dL Final  . WBC 04/12/2014 8.6  4.0 - 10.5 K/uL Final  . RBC 04/12/2014 2.89* 4.22 - 5.81 MIL/uL Final  . Hemoglobin 04/12/2014 8.6* 13.0 - 17.0 g/dL Final  . HCT 04/12/2014 26.1* 39.0 - 52.0 % Final  . MCV 04/12/2014 90.3  78.0 - 100.0 fL Final  . MCH 04/12/2014 29.8  26.0 - 34.0 pg Final  . MCHC 04/12/2014 33.0  30.0 - 36.0 g/dL Final  . RDW 04/12/2014 13.9  11.5 - 15.5 % Final  . Platelets 04/12/2014 149* 150 - 400 K/uL Final  . Sodium 04/12/2014 135  135 - 145 mmol/L Final  . Potassium 04/12/2014 4.2   3.5 - 5.1 mmol/L Final  . Chloride 04/12/2014 101  96 - 112 mmol/L Final  . CO2 04/12/2014 27  19 - 32 mmol/L Final  . Glucose, Bld 04/12/2014 111* 70 - 99 mg/dL Final  . BUN 04/12/2014 35* 6 - 23 mg/dL Final  . Creatinine, Ser 04/12/2014 6.37* 0.50 - 1.35 mg/dL Final  . Calcium 04/12/2014 8.1* 8.4 - 10.5 mg/dL Final  . Phosphorus 04/12/2014 6.8* 2.3 - 4.6 mg/dL Final  . Albumin 04/12/2014 2.5* 3.5 - 5.2 g/dL Final  . GFR calc non Af Amer 04/12/2014 9* >90 mL/min Final  . GFR calc Af Amer 04/12/2014 10* >90 mL/min Final   Comment: (NOTE) The eGFR has been calculated using the CKD EPI equation. This calculation has not been validated in all clinical situations. eGFR's persistently <90 mL/min signify possible Chronic Kidney Disease.   . Anion gap 04/12/2014 7  5 - 15 Final  . Glucose-Capillary 04/12/2014 146* 70 - 99 mg/dL Final  . Glucose-Capillary 04/12/2014 150* 70 - 99 mg/dL Final  . Glucose-Capillary 04/13/2014 108* 70 - 99 mg/dL Final  . Glucose-Capillary 04/13/2014 104* 70 - 99 mg/dL Final  Hospital Outpatient Visit on 03/29/2014  Component Date Value Ref Range Status  . aPTT 03/29/2014 31  24 - 37 seconds Final  . WBC 03/29/2014 7.8  4.0 - 10.5 K/uL Final  . RBC 03/29/2014 4.23  4.22 - 5.81 MIL/uL Final  . Hemoglobin 03/29/2014 12.5* 13.0 - 17.0 g/dL Final  . HCT 03/29/2014 38.2* 39.0 - 52.0 % Final  . MCV 03/29/2014 90.3  78.0 - 100.0 fL Final  . MCH 03/29/2014 29.6  26.0 - 34.0 pg Final  . MCHC 03/29/2014 32.7  30.0 - 36.0 g/dL Final  . RDW 03/29/2014 13.7  11.5 - 15.5 % Final  . Platelets 03/29/2014 243  150 - 400 K/uL Final  . Sodium 03/29/2014 139  135 - 145 mmol/L Final  . Potassium 03/29/2014 3.8  3.5 - 5.1 mmol/L Final  . Chloride 03/29/2014 101  96 - 112 mmol/L Final  . CO2 03/29/2014 28  19 - 32 mmol/L Final  .  Glucose, Bld 03/29/2014 111* 70 - 99 mg/dL Final  . BUN 03/29/2014 24* 6 - 23 mg/dL Final  . Creatinine, Ser 03/29/2014 4.88* 0.50 - 1.35 mg/dL Final    . Calcium 03/29/2014 9.6  8.4 - 10.5 mg/dL Final  . Total Protein 03/29/2014 8.3  6.0 - 8.3 g/dL Final  . Albumin 03/29/2014 3.6  3.5 - 5.2 g/dL Final  . AST 03/29/2014 22  0 - 37 U/L Final  . ALT 03/29/2014 17  0 - 53 U/L Final  . Alkaline Phosphatase 03/29/2014 78  39 - 117 U/L Final  . Total Bilirubin 03/29/2014 0.5  0.3 - 1.2 mg/dL Final  . GFR calc non Af Amer 03/29/2014 12* >90 mL/min Final  . GFR calc Af Amer 03/29/2014 14* >90 mL/min Final   Comment: (NOTE) The eGFR has been calculated using the CKD EPI equation. This calculation has not been validated in all clinical situations. eGFR's persistently <90 mL/min signify possible Chronic Kidney Disease.   . Anion gap 03/29/2014 10  5 - 15 Final  . Prothrombin Time 03/29/2014 13.5  11.6 - 15.2 seconds Final  . INR 03/29/2014 1.01  0.00 - 1.49 Final  . MRSA, PCR 03/29/2014 NEGATIVE  NEGATIVE Final  . Staphylococcus aureus 03/29/2014 NEGATIVE  NEGATIVE Final   Comment:        The Xpert SA Assay (FDA approved for NASAL specimens in patients over 7 years of age), is one component of a comprehensive surveillance program.  Test performance has been validated by [facility] for patients greater than or equal to 46 year old. It is not intended to diagnose infection nor to guide or monitor treatment.     No results found.   Assessment/Plan   ICD-9-CM ICD-10-CM   1. Essential hypertension, benign - controlled 401.1 I10   2. Diabetes mellitus with renal manifestations, controlled - stable 250.40 E11.21   3. Infection of total knee replacement, subsequent encounter - improving V58.89 T84.50XD   4. Depression - stable 311 F32.9   5. End stage renal disease on HD 585.6 N18.6   6. Tachycardia - probably due to #3 785.0 R00.0   7.       Seborrhea keratosis - benign; reassurance provided  --Continue current medications as ordered  --Maintain pain control level 3-4  --Follow up with dialysis as scheduled  --Keep appt with  specialists as scheduled  --Follow up in 3 mos for routine visit  Lorisa Scheid S. Perlie Gold  [facility] [address] [place], [place] [address] [phone] Cell (Monday-Friday 8 AM - 5 PM) [phone] After 5 PM and follow prompts

## 2014-06-26 NOTE — Patient Instructions (Signed)
Continue current medications as ordered  Maintain pain control level 3-4  Follow up with dialysis as scheduled  Keep appt with specialists as scheduled  Follow up in 3 mos for routine visit

## 2014-06-28 LAB — FUNGUS CULTURE W SMEAR: Fungal Smear: NONE SEEN

## 2014-07-12 ENCOUNTER — Encounter: Payer: Self-pay | Admitting: Internal Medicine

## 2014-07-12 ENCOUNTER — Ambulatory Visit (INDEPENDENT_AMBULATORY_CARE_PROVIDER_SITE_OTHER): Payer: Commercial Managed Care - HMO | Admitting: Internal Medicine

## 2014-07-12 VITALS — BP 158/98 | HR 112 | Temp 99.7°F | Ht 71.0 in | Wt 205.0 lb

## 2014-07-12 DIAGNOSIS — E1121 Type 2 diabetes mellitus with diabetic nephropathy: Secondary | ICD-10-CM

## 2014-07-12 DIAGNOSIS — I10 Essential (primary) hypertension: Secondary | ICD-10-CM

## 2014-07-12 DIAGNOSIS — N186 End stage renal disease: Secondary | ICD-10-CM | POA: Diagnosis not present

## 2014-07-12 DIAGNOSIS — R000 Tachycardia, unspecified: Secondary | ICD-10-CM

## 2014-07-12 DIAGNOSIS — J069 Acute upper respiratory infection, unspecified: Secondary | ICD-10-CM

## 2014-07-12 DIAGNOSIS — E1129 Type 2 diabetes mellitus with other diabetic kidney complication: Secondary | ICD-10-CM

## 2014-07-12 MED ORDER — OSELTAMIVIR PHOSPHATE 30 MG PO CAPS: 30.0000 mg | ORAL_CAPSULE | Freq: Every day | ORAL | Status: DC

## 2014-07-12 MED ORDER — METHYLPREDNISOLONE ACETATE 80 MG/ML IJ SUSP
80.0000 mg | Freq: Once | INTRAMUSCULAR | Status: AC
  Administered 2014-07-12: 80 mg via INTRAMUSCULAR

## 2014-07-12 NOTE — Patient Instructions (Signed)
Push fluids and rest  Depo-medrol 80mg  injection given today  Follow up with dialysis as scheduled  Take Tamiflu ONLY AFTER dialysis  Continue other medications as ordered  Follow up as scheduled

## 2014-07-12 NOTE — Progress Notes (Signed)
[name], male   DOB: 1960/07/21, 54 y.o.   MRN: [id number]    Location:    [facility]   Place of Service:   OFFICE  Chief Complaint  Patient presents with  . Acute Visit    URI- cough at night, drainage, and headache. Symptoms onset Wednesday morning     HPI:  54 yo male seen today for URI sx's x 3 days. Tried theraflu OTC with temporary relief. He reports feeling achy, dry cough, dry heaving, nausea, HA, dizziness, post nasal drip, sinus pressure/pain, sore throat, back pain and his skin "hurts". Sick contacts with similar sx's. He has hx ESRD on HD MWF and DM. BS 120s fasting. No low BS reactions. No hyperglycemia  BP elevated today. He is taking med as Rx  He is still smoking cigs (2-3 per day usually). No plans to quit  Past Medical History  Diagnosis Date  . Polymyalgia rheumatica   . Hypertension   . Diabetic neuropathy   . Osteomyelitis of foot, left, acute   . Anxiety   . Insomnia, unspecified   . Unspecified vitamin D deficiency   . Anemia, unspecified   . Other chronic postoperative pain   . Unspecified hereditary and idiopathic peripheral neuropathy   . Allergy   . Unspecified osteomyelitis, site unspecified   . Long term (current) use of anticoagulants   . 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 Ulcer     diabetic foot   . Arthralgia 2010    polyarticular  . Hemorrhoids, internal 10/2011    small  . CHF (congestive heart failure)   . CHF (congestive heart failure) 07/25/2009    denies  . Hemodialysis access site with mature fistula   . Myocardial infarction 1995  . Pneumonia     "probably 4-5 times" (05/09/2014)  . Sleep apnea     "lost weight; no more problem" (05/09/2014)  . Type II diabetes mellitus dx'd 1995  . History of blood transfusion     "related to the anemia"  . GERD (gastroesophageal reflux disease)     hx "before I lost weight"  . ESRD (end stage renal disease) on dialysis     started 12/2012; "MWF; [facility]"    . Stroke 01/10/06    denies residual on 05/09/2014  . Arthritis     "back, knees" (05/09/2014)  . Chronic lower back pain     Past Surgical History  Procedure Laterality Date  . Amputation  01/21/2012    Procedure: AMPUTATION RAY;  Surgeon: [name], MD;  Location: [facility];  Service: Orthopedics;  Laterality: Left;  Left Foot 4th Ray Amputation  . Anterior cervical decomp/discectomy fusion  02/2011  . Knee arthroscopy Left 08-25-2012  . Toe amputation Bilateral     "I've lost 7 toes over the last 7 years" (05/09/2014)  . Refractive surgery Bilateral   . Bascilic vein transposition Left 10/19/2012    Procedure: BASCILIC VEIN TRANSPOSITION;  Surgeon: Serafina Mitchell, MD;  Location: [place];  Service: Vascular;  Laterality: Left;  . Tonsillectomy    . Amputation Left 05/04/2013    Procedure: AMPUTATION DIGIT;  Surgeon: [name], MD;  Location: [place];  Service: Orthopedics;  Laterality: Left;  Left Great Toe Amputation at MTP  . Toe surgery Left April 2015    Big toe removed on left foot.  . Total knee arthroplasty Left 04/10/2014    Procedure: TOTAL KNEE ARTHROPLASTY;  Surgeon: Meridee Score  V, MD;  Location: [place];  Service: Orthopedics;  Laterality: Left;  . Wound debridement Left 05/09/2014    Dehiscence Left Total Knee Arthroplasty Incision  . Back surgery    . Uvulopalatopharyngoplasty, tonsillectomy and septoplasty  ~ 1989  . I&d extremity Left 05/09/2014    Procedure: Irrigation and Debridement Left Knee and Closure of Total Knee Arthroplasty Incision;  Surgeon: [name], MD;  Location: [place] [facility];  Service: Orthopedics;  Laterality: Left;  . I&d knee with poly exchange Left 05/31/2014    Procedure: IRRIGATION AND DEBRIDEMENT LEFT KNEE, PLACE ANTIBIOTIC BEADS,  POLY EXCHANGE;  Surgeon: [name], MD;  Location: [place];  Service: Orthopedics;  Laterality: Left;    Patient Care Team: Gildardo Cranker, DO as PCP - General (Internal Medicine)  History   Social History  . Marital  Status: Married    Spouse Name: N/A  . Number of Children: N/A  . Years of Education: N/A   Occupational History  . Not on file.   Social History Main Topics  . Smoking status: Current Some Day Smoker -- 0.12 packs/day for 32 years    Types: Cigarettes  . Smokeless tobacco: Never Used     Comment: 3 -4 cig daily   . Alcohol Use: No  . Drug Use: No  . Sexual Activity: Yes   Other Topics Concern  . Not on file   Social History Narrative     reports that he has been smoking Cigarettes.  He has a 3.84 pack-year smoking history. He has never used smokeless tobacco. He reports that he does not drink alcohol or use illicit drugs.  Allergies  Allergen Reactions  . Morphine And Related     hallucinations  . Tygacil [Tigecycline] Nausea And Vomiting, Nausea Only and Other (See Comments)    Medications: Patient's Medications  New Prescriptions   No medications on file  Previous Medications   AMITRIPTYLINE (ELAVIL) 50 MG TABLET    Take 1 tablet (50 mg total) by mouth at bedtime.   AMLODIPINE (NORVASC) 5 MG TABLET    Take 1 tablet by mouth daily.   CALCIUM ACETATE (PHOSLO) 667 MG CAPSULE    Take 667 mg by mouth 3 (three) times daily with meals.   CHOLECALCIFEROL (VITAMIN D PO)    Take 1 tablet by mouth 3 (three) times a week. At dialysis-MWF   GLIPIZIDE (GLUCOTROL) 5 MG TABLET    Take 1 tablet (5 mg total) by mouth daily before breakfast.   HYDROCODONE-ACETAMINOPHEN (NORCO/VICODIN) 5-325 MG PER TABLET    Take 1 tablet by mouth every 4 (four) hours as needed for moderate pain.    RANITIDINE (ZANTAC) 150 MG TABLET    Take 150 mg by mouth daily as needed for heartburn.    SILDENAFIL (VIAGRA) 25 MG TABLET    Take 2 tablets (50 mg total) by mouth daily as needed for erectile dysfunction.   ZOLPIDEM (AMBIEN) 10 MG TABLET    Take 1 tablet (10 mg total) by mouth at bedtime as needed for sleep.  Modified Medications   Modified Medication Previous Medication   GABAPENTIN (NEURONTIN) 100 MG  CAPSULE gabapentin (NEURONTIN) 100 MG capsule      Take 2 capsules (200 mg total) by mouth daily.    Take 2 capsules (200 mg total) by mouth 2 (two) times daily.  Discontinued Medications   No medications on file    Review of Systems  Constitutional: Positive for fever, appetite change and fatigue.  HENT:  Positive for postnasal drip, sinus pressure and sore throat.   Respiratory: Positive for cough.   Gastrointestinal: Positive for nausea. Negative for abdominal pain.  Musculoskeletal: Positive for back pain, arthralgias and gait problem.  Neurological: Positive for dizziness and headaches.  All other systems reviewed and are negative.   Filed Vitals:   07/12/14 1531  BP: 158/98  Pulse: 112  Temp: 99.7 F (37.6 C)  TempSrc: Oral  Height: '5\' 11"'  (1.803 m)  Weight: 205 lb (92.987 kg)  SpO2: 94%   Body mass index is 28.6 kg/(m^2).  Physical Exam  Constitutional: He appears well-developed and well-nourished. No distress.  Looks ill in NAD  HENT:  Maxillary sinus TTP with boggy tissue texture changes. oropharynx cobblestoning appearance with redness but no exudate. MMM  Eyes: Pupils are equal, round, and reactive to light. No scleral icterus.  Neck: Neck supple. No tracheal deviation present.  Cardiovascular: Regular rhythm, normal heart sounds and intact distal pulses.  Tachycardia present.  Exam reveals no gallop and no friction rub.   No murmur heard. No distal extremity swelling  Pulmonary/Chest: Effort normal. No stridor. No respiratory distress. He has no wheezes. He has rales (expiratory).  Reduced BS R>L at base  Lymphadenopathy:    He has cervical adenopathy (R>L anterior cervical palpable nodes, TTP).  Skin: Skin is warm, dry and intact. No rash noted.  Psychiatric: His speech is normal and behavior is normal. Thought content normal. He exhibits a depressed mood.     Labs reviewed: Admission on 05/31/2014, Discharged on 06/08/2014  No results displayed because  visit has over 200 results.    Admission on 05/09/2014, Discharged on 05/10/2014  Component Date Value Ref Range Status  . WBC 05/09/2014 6.9  4.0 - 10.5 K/uL Final  . RBC 05/09/2014 3.78* 4.22 - 5.81 MIL/uL Final  . Hemoglobin 05/09/2014 10.6* 13.0 - 17.0 g/dL Final  . HCT 05/09/2014 34.1* 39.0 - 52.0 % Final  . MCV 05/09/2014 90.2  78.0 - 100.0 fL Final  . MCH 05/09/2014 28.0  26.0 - 34.0 pg Final  . MCHC 05/09/2014 31.1  30.0 - 36.0 g/dL Final  . RDW 05/09/2014 14.9  11.5 - 15.5 % Final  . Platelets 05/09/2014 248  150 - 400 K/uL Final  . Neutrophils Relative % 05/09/2014 51  43 - 77 % Final  . Neutro Abs 05/09/2014 3.6  1.7 - 7.7 K/uL Final  . Lymphocytes Relative 05/09/2014 31  12 - 46 % Final  . Lymphs Abs 05/09/2014 2.2  0.7 - 4.0 K/uL Final  . Monocytes Relative 05/09/2014 14* 3 - 12 % Final  . Monocytes Absolute 05/09/2014 1.0  0.1 - 1.0 K/uL Final  . Eosinophils Relative 05/09/2014 3  0 - 5 % Final  . Eosinophils Absolute 05/09/2014 0.2  0.0 - 0.7 K/uL Final  . Basophils Relative 05/09/2014 1  0 - 1 % Final  . Basophils Absolute 05/09/2014 0.1  0.0 - 0.1 K/uL Final  . Sodium 05/09/2014 137  135 - 145 mmol/L Final  . Potassium 05/09/2014 4.8  3.5 - 5.1 mmol/L Final  . Chloride 05/09/2014 100  96 - 112 mmol/L Final  . BUN 05/09/2014 11  6 - 23 mg/dL Final  . Creatinine, Ser 05/09/2014 3.10* 0.50 - 1.35 mg/dL Final  . Glucose, Bld 05/09/2014 121* 70 - 99 mg/dL Final  . Calcium, Ion 05/09/2014 1.13  1.12 - 1.23 mmol/L Final  . TCO2 05/09/2014 24  0 - 100 mmol/L Final  . Hemoglobin  05/09/2014 11.2* 13.0 - 17.0 g/dL Final  . HCT 05/09/2014 33.0* 39.0 - 52.0 % Final  . Glucose-Capillary 05/09/2014 104* 70 - 99 mg/dL Final  . Comment 1 05/09/2014 Notify RN   Final  . Comment 2 05/09/2014 Document in Chart   Final  . Glucose-Capillary 05/10/2014 97  70 - 99 mg/dL Final  . Comment 1 05/10/2014 Notify RN   Final  . Glucose-Capillary 05/10/2014 126* 70 - 99 mg/dL Final  . Comment  1 05/10/2014 Notify RN   Final  Admission on 04/10/2014, Discharged on 04/13/2014  Component Date Value Ref Range Status  . Sodium 04/10/2014 140  135 - 145 mmol/L Final  . Potassium 04/10/2014 4.3  3.5 - 5.1 mmol/L Final  . Glucose, Bld 04/10/2014 114* 70 - 99 mg/dL Final  . HCT 04/10/2014 36.0* 39.0 - 52.0 % Final  . Hemoglobin 04/10/2014 12.2* 13.0 - 17.0 g/dL Final  . Glucose-Capillary 04/10/2014 94  70 - 99 mg/dL Final  . Comment 1 04/10/2014 Notify RN   Final  . Comment 2 04/10/2014 Document in Chart   Final  . WBC 04/10/2014 9.7  4.0 - 10.5 K/uL Final  . RBC 04/10/2014 3.56* 4.22 - 5.81 MIL/uL Final  . Hemoglobin 04/10/2014 10.3* 13.0 - 17.0 g/dL Final  . HCT 04/10/2014 31.9* 39.0 - 52.0 % Final  . MCV 04/10/2014 89.6  78.0 - 100.0 fL Final  . MCH 04/10/2014 28.9  26.0 - 34.0 pg Final  . MCHC 04/10/2014 32.3  30.0 - 36.0 g/dL Final  . RDW 04/10/2014 13.8  11.5 - 15.5 % Final  . Platelets 04/10/2014 182  150 - 400 K/uL Final  . Sodium 04/10/2014 139  135 - 145 mmol/L Final  . Potassium 04/10/2014 4.3  3.5 - 5.1 mmol/L Final  . Chloride 04/10/2014 101  96 - 112 mmol/L Final  . CO2 04/10/2014 25  19 - 32 mmol/L Final  . Glucose, Bld 04/10/2014 147* 70 - 99 mg/dL Final  . BUN 04/10/2014 32* 6 - 23 mg/dL Final  . Creatinine, Ser 04/10/2014 5.40* 0.50 - 1.35 mg/dL Final  . Calcium 04/10/2014 8.8  8.4 - 10.5 mg/dL Final  . GFR calc non Af Amer 04/10/2014 11* >90 mL/min Final  . GFR calc Af Amer 04/10/2014 12* >90 mL/min Final   Comment: (NOTE) The eGFR has been calculated using the CKD EPI equation. This calculation has not been validated in all clinical situations. eGFR's persistently <90 mL/min signify possible Chronic Kidney Disease.   . Anion gap 04/10/2014 13  5 - 15 Final  . Glucose-Capillary 04/10/2014 190* 70 - 99 mg/dL Final  . Comment 1 04/10/2014 Notify RN   Final  . Glucose-Capillary 04/11/2014 128* 70 - 99 mg/dL Final  . Comment 1 04/11/2014 Notify RN   Final  .  Glucose-Capillary 04/11/2014 109* 70 - 99 mg/dL Final  . Comment 1 04/11/2014 Notify RN   Final  . Glucose-Capillary 04/11/2014 88  70 - 99 mg/dL Final  . Glucose-Capillary 04/11/2014 123* 70 - 99 mg/dL Final  . Glucose-Capillary 04/12/2014 103* 70 - 99 mg/dL Final  . WBC 04/12/2014 8.6  4.0 - 10.5 K/uL Final  . RBC 04/12/2014 2.89* 4.22 - 5.81 MIL/uL Final  . Hemoglobin 04/12/2014 8.6* 13.0 - 17.0 g/dL Final  . HCT 04/12/2014 26.1* 39.0 - 52.0 % Final  . MCV 04/12/2014 90.3  78.0 - 100.0 fL Final  . MCH 04/12/2014 29.8  26.0 - 34.0 pg Final  . MCHC 04/12/2014 33.0  30.0 - 36.0 g/dL Final  . RDW 04/12/2014 13.9  11.5 - 15.5 % Final  . Platelets 04/12/2014 149* 150 - 400 K/uL Final  . Sodium 04/12/2014 135  135 - 145 mmol/L Final  . Potassium 04/12/2014 4.2  3.5 - 5.1 mmol/L Final  . Chloride 04/12/2014 101  96 - 112 mmol/L Final  . CO2 04/12/2014 27  19 - 32 mmol/L Final  . Glucose, Bld 04/12/2014 111* 70 - 99 mg/dL Final  . BUN 04/12/2014 35* 6 - 23 mg/dL Final  . Creatinine, Ser 04/12/2014 6.37* 0.50 - 1.35 mg/dL Final  . Calcium 04/12/2014 8.1* 8.4 - 10.5 mg/dL Final  . Phosphorus 04/12/2014 6.8* 2.3 - 4.6 mg/dL Final  . Albumin 04/12/2014 2.5* 3.5 - 5.2 g/dL Final  . GFR calc non Af Amer 04/12/2014 9* >90 mL/min Final  . GFR calc Af Amer 04/12/2014 10* >90 mL/min Final   Comment: (NOTE) The eGFR has been calculated using the CKD EPI equation. This calculation has not been validated in all clinical situations. eGFR's persistently <90 mL/min signify possible Chronic Kidney Disease.   . Anion gap 04/12/2014 7  5 - 15 Final  . Glucose-Capillary 04/12/2014 146* 70 - 99 mg/dL Final  . Glucose-Capillary 04/12/2014 150* 70 - 99 mg/dL Final  . Glucose-Capillary 04/13/2014 108* 70 - 99 mg/dL Final  . Glucose-Capillary 04/13/2014 104* 70 - 99 mg/dL Final    No results found.   Assessment/Plan   ICD-9-CM ICD-10-CM   1. Acute upper respiratory infection - probable flu 465.9 J06.9  oseltamivir (TAMIFLU) 30 MG capsule     methylPREDNISolone acetate (DEPO-MEDROL) injection 80 mg  2. End stage renal disease on HD MWF 585.6 N18.6   3. Essential hypertension, benign - elevated today due to #1 401.1 I10   4. Tachycardia 785.0 R00.0   5. Diabetes mellitus with renal manifestations, controlled  250.40 E11.21    --Push fluids per HD fluid guidelines and rest  --Depo-medrol 59m injection given today  --Follow up with dialysis as scheduled - HE REFUSES TO GO TO HD TODAY. Dialysis center notified. Discussed consequences of not being compliant with HD. He states he will "go tomorrow"  --Take Tamiflu ONLY AFTER dialysis  --Continue other medications as ordered  --Follow up as scheduled  Lakiah Dhingra S. CPerlie Gold PGordon Memorial [facility] Districtand Adult Medicine [address] [place] [phone] (Monday-Friday 8 AM - 5 PM) ([phone] 5 PM and follow prompts

## 2014-07-13 ENCOUNTER — Inpatient Hospital Stay (HOSPITAL_COMMUNITY)
Admission: EM | Admit: 2014-07-13 | Discharge: 2014-07-14 | DRG: 291 | Disposition: A | Discharge status: Home / Self Care | Payer: Commercial Managed Care - HMO | Attending: Internal Medicine | Admitting: Internal Medicine

## 2014-07-13 ENCOUNTER — Emergency Department (HOSPITAL_COMMUNITY): Payer: Commercial Managed Care - HMO

## 2014-07-13 ENCOUNTER — Encounter (HOSPITAL_COMMUNITY): Payer: Self-pay | Admitting: *Deleted

## 2014-07-13 ENCOUNTER — Other Ambulatory Visit: Payer: Self-pay

## 2014-07-13 DIAGNOSIS — E875 Hyperkalemia: Secondary | ICD-10-CM | POA: Diagnosis present

## 2014-07-13 DIAGNOSIS — A419 Sepsis, unspecified organism: Secondary | ICD-10-CM | POA: Diagnosis present

## 2014-07-13 DIAGNOSIS — E559 Vitamin D deficiency, unspecified: Secondary | ICD-10-CM | POA: Diagnosis present

## 2014-07-13 DIAGNOSIS — Z8249 Family history of ischemic heart disease and other diseases of the circulatory system: Secondary | ICD-10-CM

## 2014-07-13 DIAGNOSIS — M353 Polymyalgia rheumatica: Secondary | ICD-10-CM | POA: Diagnosis present

## 2014-07-13 DIAGNOSIS — Z992 Dependence on renal dialysis: Secondary | ICD-10-CM | POA: Diagnosis not present

## 2014-07-13 DIAGNOSIS — Z888 Allergy status to other drugs, medicaments and biological substances status: Secondary | ICD-10-CM

## 2014-07-13 DIAGNOSIS — E1140 Type 2 diabetes mellitus with diabetic neuropathy, unspecified: Secondary | ICD-10-CM | POA: Diagnosis present

## 2014-07-13 DIAGNOSIS — Z79899 Other long term (current) drug therapy: Secondary | ICD-10-CM

## 2014-07-13 DIAGNOSIS — F17210 Nicotine dependence, cigarettes, uncomplicated: Secondary | ICD-10-CM | POA: Diagnosis present

## 2014-07-13 DIAGNOSIS — E119 Type 2 diabetes mellitus without complications: Secondary | ICD-10-CM

## 2014-07-13 DIAGNOSIS — I120 Hypertensive chronic kidney disease with stage 5 chronic kidney disease or end stage renal disease: Secondary | ICD-10-CM | POA: Diagnosis present

## 2014-07-13 DIAGNOSIS — Z89421 Acquired absence of other right toe(s): Secondary | ICD-10-CM | POA: Diagnosis not present

## 2014-07-13 DIAGNOSIS — I509 Heart failure, unspecified: Secondary | ICD-10-CM | POA: Diagnosis present

## 2014-07-13 DIAGNOSIS — N186 End stage renal disease: Secondary | ICD-10-CM | POA: Diagnosis present

## 2014-07-13 DIAGNOSIS — Z89422 Acquired absence of other left toe(s): Secondary | ICD-10-CM

## 2014-07-13 DIAGNOSIS — Z809 Family history of malignant neoplasm, unspecified: Secondary | ICD-10-CM | POA: Diagnosis not present

## 2014-07-13 DIAGNOSIS — R05 Cough: Secondary | ICD-10-CM

## 2014-07-13 DIAGNOSIS — R059 Cough, unspecified: Secondary | ICD-10-CM

## 2014-07-13 DIAGNOSIS — Z79891 Long term (current) use of opiate analgesic: Secondary | ICD-10-CM | POA: Diagnosis not present

## 2014-07-13 DIAGNOSIS — I10 Essential (primary) hypertension: Secondary | ICD-10-CM

## 2014-07-13 DIAGNOSIS — Z9889 Other specified postprocedural states: Secondary | ICD-10-CM | POA: Diagnosis not present

## 2014-07-13 DIAGNOSIS — E669 Obesity, unspecified: Secondary | ICD-10-CM | POA: Diagnosis present

## 2014-07-13 DIAGNOSIS — Z823 Family history of stroke: Secondary | ICD-10-CM

## 2014-07-13 DIAGNOSIS — K219 Gastro-esophageal reflux disease without esophagitis: Secondary | ICD-10-CM | POA: Diagnosis present

## 2014-07-13 DIAGNOSIS — N189 Chronic kidney disease, unspecified: Secondary | ICD-10-CM

## 2014-07-13 DIAGNOSIS — I252 Old myocardial infarction: Secondary | ICD-10-CM | POA: Diagnosis not present

## 2014-07-13 DIAGNOSIS — D631 Anemia in chronic kidney disease: Secondary | ICD-10-CM | POA: Diagnosis present

## 2014-07-13 DIAGNOSIS — Z89412 Acquired absence of left great toe: Secondary | ICD-10-CM

## 2014-07-13 DIAGNOSIS — Z792 Long term (current) use of antibiotics: Secondary | ICD-10-CM

## 2014-07-13 DIAGNOSIS — E118 Type 2 diabetes mellitus with unspecified complications: Secondary | ICD-10-CM

## 2014-07-13 DIAGNOSIS — Z794 Long term (current) use of insulin: Secondary | ICD-10-CM

## 2014-07-13 DIAGNOSIS — Z885 Allergy status to narcotic agent status: Secondary | ICD-10-CM

## 2014-07-13 DIAGNOSIS — E1129 Type 2 diabetes mellitus with other diabetic kidney complication: Secondary | ICD-10-CM

## 2014-07-13 DIAGNOSIS — J9601 Acute respiratory failure with hypoxia: Secondary | ICD-10-CM | POA: Diagnosis present

## 2014-07-13 DIAGNOSIS — Z96652 Presence of left artificial knee joint: Secondary | ICD-10-CM | POA: Diagnosis present

## 2014-07-13 DIAGNOSIS — Z8673 Personal history of transient ischemic attack (TIA), and cerebral infarction without residual deficits: Secondary | ICD-10-CM | POA: Diagnosis not present

## 2014-07-13 LAB — I-STAT CG4 LACTIC ACID, ED
Lactic Acid, Venous: 2.26 mmol/L (ref 0.5–2.0)
Lactic Acid, Venous: 1.43 mmol/L (ref 0.5–2.0)

## 2014-07-13 LAB — I-STAT ARTERIAL BLOOD GAS, ED
Acid-base deficit: 5 mmol/L — ABNORMAL HIGH (ref 0.0–2.0)
BICARBONATE: 19.6 meq/L — AB (ref 20.0–24.0)
O2 SAT: 94 %
TCO2: 21 mmol/L (ref 0–100)
pCO2 arterial: 33.5 mmHg — ABNORMAL LOW (ref 35.0–45.0)
pH, Arterial: 7.376 (ref 7.350–7.450)
pO2, Arterial: 72 mmHg — ABNORMAL LOW (ref 80.0–100.0)

## 2014-07-13 LAB — CBC WITH DIFFERENTIAL/PLATELET
BASOS PCT: 0 % (ref 0–1)
Basophils Absolute: 0 10*3/uL (ref 0.0–0.1)
Eosinophils Absolute: 0.1 10*3/uL (ref 0.0–0.7)
Eosinophils Relative: 1 % (ref 0–5)
HCT: 34.5 % — ABNORMAL LOW (ref 39.0–52.0)
Hemoglobin: 10.5 g/dL — ABNORMAL LOW (ref 13.0–17.0)
LYMPHS ABS: 2.6 10*3/uL (ref 0.7–4.0)
LYMPHS PCT: 19 % (ref 12–46)
MCH: 26.2 pg (ref 26.0–34.0)
MCHC: 30.4 g/dL (ref 30.0–36.0)
MCV: 86 fL (ref 78.0–100.0)
Monocytes Absolute: 0.9 10*3/uL (ref 0.1–1.0)
Monocytes Relative: 7 % (ref 3–12)
NEUTROS ABS: 9.8 10*3/uL — AB (ref 1.7–7.7)
Neutrophils Relative %: 73 % (ref 43–77)
Platelets: 280 10*3/uL (ref 150–400)
RBC: 4.01 MIL/uL — AB (ref 4.22–5.81)
RDW: 17.4 % — ABNORMAL HIGH (ref 11.5–15.5)
WBC: 13.5 10*3/uL — ABNORMAL HIGH (ref 4.0–10.5)

## 2014-07-13 LAB — I-STAT CHEM 8, ED
BUN: 40 mg/dL — AB (ref 6–20)
Calcium, Ion: 1.11 mmol/L — ABNORMAL LOW (ref 1.12–1.23)
Chloride: 107 mmol/L (ref 101–111)
Creatinine, Ser: 7.1 mg/dL — ABNORMAL HIGH (ref 0.61–1.24)
Glucose, Bld: 252 mg/dL — ABNORMAL HIGH (ref 65–99)
HCT: 33 % — ABNORMAL LOW (ref 39.0–52.0)
Hemoglobin: 11.2 g/dL — ABNORMAL LOW (ref 13.0–17.0)
Potassium: 5.4 mmol/L — ABNORMAL HIGH (ref 3.5–5.1)
Sodium: 138 mmol/L (ref 135–145)
TCO2: 20 mmol/L (ref 0–100)

## 2014-07-13 LAB — CBG MONITORING, ED: GLUCOSE-CAPILLARY: 227 mg/dL — AB (ref 65–99)

## 2014-07-13 LAB — COMPREHENSIVE METABOLIC PANEL
ALT: 12 U/L — ABNORMAL LOW (ref 17–63)
AST: 19 U/L (ref 15–41)
Albumin: 2.8 g/dL — ABNORMAL LOW (ref 3.5–5.0)
Alkaline Phosphatase: 89 U/L (ref 38–126)
Anion gap: 13 (ref 5–15)
BILIRUBIN TOTAL: 0.6 mg/dL (ref 0.3–1.2)
BUN: 39 mg/dL — AB (ref 6–20)
CO2: 20 mmol/L — ABNORMAL LOW (ref 22–32)
Calcium: 8.7 mg/dL — ABNORMAL LOW (ref 8.9–10.3)
Chloride: 104 mmol/L (ref 101–111)
Creatinine, Ser: 6.96 mg/dL — ABNORMAL HIGH (ref 0.61–1.24)
GFR calc Af Amer: 9 mL/min — ABNORMAL LOW (ref >60)
GFR calc non Af Amer: 8 mL/min — ABNORMAL LOW (ref >60)
Glucose, Bld: 253 mg/dL — ABNORMAL HIGH (ref 65–99)
Potassium: 5.5 mmol/L — ABNORMAL HIGH (ref 3.5–5.1)
Sodium: 137 mmol/L (ref 135–145)
Total Protein: 8.5 g/dL — ABNORMAL HIGH (ref 6.5–8.1)

## 2014-07-13 LAB — I-STAT TROPONIN, ED: TROPONIN I, POC: 0.02 ng/mL (ref 0.00–0.08)

## 2014-07-13 MED ORDER — HEPARIN SODIUM (PORCINE) 1000 UNIT/ML DIALYSIS
1000.0000 [IU] | INTRAMUSCULAR | Status: DC | PRN
  Filled 2014-07-13: qty 1

## 2014-07-13 MED ORDER — PENTAFLUOROPROP-TETRAFLUOROETH EX AERO: 1.0000 "application " | INHALATION_SPRAY | CUTANEOUS | Status: DC | PRN

## 2014-07-13 MED ORDER — NEPRO/CARBSTEADY PO LIQD
237.0000 mL | ORAL | Status: DC | PRN
  Filled 2014-07-13: qty 237

## 2014-07-13 MED ORDER — HEPARIN SODIUM (PORCINE) 1000 UNIT/ML DIALYSIS
4500.0000 [IU] | INTRAMUSCULAR | Status: DC | PRN
  Filled 2014-07-13: qty 5

## 2014-07-13 MED ORDER — NITROGLYCERIN 0.4 MG SL SUBL
0.4000 mg | SUBLINGUAL_TABLET | SUBLINGUAL | Status: DC | PRN
  Administered 2014-07-13: 0.4 mg via SUBLINGUAL

## 2014-07-13 MED ORDER — LIDOCAINE HCL (PF) 1 % IJ SOLN: 5.0000 mL | INTRAMUSCULAR | Status: DC | PRN

## 2014-07-13 MED ORDER — NITROGLYCERIN 2 % TD OINT
1.0000 [in_us] | TOPICAL_OINTMENT | Freq: Four times a day (QID) | TRANSDERMAL | Status: DC
  Administered 2014-07-13: 1 [in_us] via TOPICAL
  Filled 2014-07-13: qty 1

## 2014-07-13 MED ORDER — SODIUM CHLORIDE 0.9 % IV SOLN: 100.0000 mL | INTRAVENOUS | Status: DC | PRN

## 2014-07-13 MED ORDER — INSULIN ASPART 100 UNIT/ML ~~LOC~~ SOLN
0.0000 [IU] | SUBCUTANEOUS | Status: DC
  Administered 2014-07-14 (×2): 2 [IU] via SUBCUTANEOUS

## 2014-07-13 MED ORDER — LORAZEPAM 2 MG/ML IJ SOLN
1.0000 mg | Freq: Once | INTRAMUSCULAR | Status: AC
  Administered 2014-07-13: 1 mg via INTRAVENOUS
  Filled 2014-07-13: qty 1

## 2014-07-13 MED ORDER — ALTEPLASE 2 MG IJ SOLR
2.0000 mg | Freq: Once | INTRAMUSCULAR | Status: AC | PRN
  Filled 2014-07-13: qty 2

## 2014-07-13 MED ORDER — ALBUTEROL SULFATE (2.5 MG/3ML) 0.083% IN NEBU: 2.5000 mg | INHALATION_SOLUTION | Freq: Four times a day (QID) | RESPIRATORY_TRACT | Status: DC | PRN

## 2014-07-13 MED ORDER — VANCOMYCIN HCL 10 G IV SOLR
1750.0000 mg | Freq: Once | INTRAVENOUS | Status: AC
  Administered 2014-07-13: 1750 mg via INTRAVENOUS
  Filled 2014-07-13: qty 1750

## 2014-07-13 MED ORDER — PIPERACILLIN-TAZOBACTAM IN DEX 2-0.25 GM/50ML IV SOLN
2.2500 g | Freq: Once | INTRAVENOUS | Status: AC
  Administered 2014-07-13: 2.25 g via INTRAVENOUS
  Filled 2014-07-13: qty 50

## 2014-07-13 MED ORDER — LIDOCAINE-PRILOCAINE 2.5-2.5 % EX CREA: 1.0000 "application " | TOPICAL_CREAM | CUTANEOUS | Status: DC | PRN

## 2014-07-13 NOTE — ED Notes (Signed)
Visitor went  home

## 2014-07-13 NOTE — ED Notes (Signed)
Unable to obtain 2 blood cultures.  One set drawn and then antibiotics started

## 2014-07-13 NOTE — ED Provider Notes (Signed)
CSN: [id number]     Arrival date & time 07/13/14  1949 History   First MD Initiated Contact with Patient 07/13/14 1959     Chief Complaint  Patient presents with  . Respiratory Distress   (Consider location/radiation/quality/duration/timing/severity/associated sxs/prior Treatment) Patient is a 54 y.o. male presenting with shortness of breath. The history is provided by the patient and the EMS personnel. The history is limited by the condition of the patient. No language interpreter was used.  Shortness of Breath Severity:  Severe Onset quality:  Gradual Timing:  Constant Progression:  Worsening Chronicity:  New Context comment:  Missing dialysis  Relieved by:  Nothing Worsened by:  Stress Ineffective treatments:  None tried Associated symptoms: diaphoresis   Associated symptoms: no abdominal pain, no chest pain, no cough, no fever, no headaches, no sputum production and no vomiting     Past Medical History  Diagnosis Date  . Polymyalgia rheumatica   . Hypertension   . Diabetic neuropathy   . Osteomyelitis of foot, left, acute   . Anxiety   . Insomnia, unspecified   . Unspecified vitamin D deficiency   . Anemia, unspecified   . Other chronic postoperative pain   . Unspecified hereditary and idiopathic peripheral neuropathy   . Allergy   . Unspecified osteomyelitis, site unspecified   . Long term (current) use of anticoagulants   . 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 Ulcer     diabetic foot   . Arthralgia 2010    polyarticular  . Hemorrhoids, internal 10/2011    small  . CHF (congestive heart failure)   . CHF (congestive heart failure) 07/25/2009    denies  . Hemodialysis access site with mature fistula   . Myocardial infarction 1995  . Pneumonia     "probably 4-5 times" (05/09/2014)  . Sleep apnea     "lost weight; no more problem" (05/09/2014)  . Type II diabetes mellitus dx'd 1995  . History of blood transfusion     "related to the anemia"  . GERD  (gastroesophageal reflux disease)     hx "before I lost weight"  . ESRD (end stage renal disease) on dialysis     started 12/2012; "MWF; [facility]"  . Stroke 01/10/06    denies residual on 05/09/2014  . Arthritis     "back, knees" (05/09/2014)  . Chronic lower back pain    Past Surgical History  Procedure Laterality Date  . Amputation  01/21/2012    Procedure: AMPUTATION RAY;  Surgeon: [name], MD;  Location: [place];  Service: Orthopedics;  Laterality: Left;  Left Foot 4th Ray Amputation  . Anterior cervical decomp/discectomy fusion  02/2011  . Knee arthroscopy Left 08-25-2012  . Toe amputation Bilateral     "I've lost 7 toes over the last 7 years" (05/09/2014)  . Refractive surgery Bilateral   . Bascilic vein transposition Left 10/19/2012    Procedure: BASCILIC VEIN TRANSPOSITION;  Surgeon: Serafina Mitchell, MD;  Location: [place];  Service: Vascular;  Laterality: Left;  . Tonsillectomy    . Amputation Left 05/04/2013    Procedure: AMPUTATION DIGIT;  Surgeon: [name], MD;  Location: [place];  Service: Orthopedics;  Laterality: Left;  Left Great Toe Amputation at [facility]  . Toe surgery Left April 2015    Big toe removed on left foot.  . Total knee arthroplasty Left 04/10/2014    Procedure: TOTAL KNEE ARTHROPLASTY;  Surgeon: [name], MD;  Location:  [place] [facility];  Service: Orthopedics;  Laterality: Left;  . Wound debridement Left 05/09/2014    Dehiscence Left Total Knee Arthroplasty Incision  . Back surgery    . Uvulopalatopharyngoplasty, tonsillectomy and septoplasty  ~ 1989  . I&d extremity Left 05/09/2014    Procedure: Irrigation and Debridement Left Knee and Closure of Total Knee Arthroplasty Incision;  Surgeon: [name], MD;  Location: [place];  Service: Orthopedics;  Laterality: Left;  . I&d knee with poly exchange Left 05/31/2014    Procedure: IRRIGATION AND DEBRIDEMENT LEFT KNEE, PLACE ANTIBIOTIC BEADS,  POLY EXCHANGE;  Surgeon: [name], MD;  Location: [place];  Service:  Orthopedics;  Laterality: Left;   Family History  Problem Relation Age of Onset  . Hypertension Mother   . Cancer Mother 35    Ovarian  . Heart disease Maternal Aunt   . Stroke Maternal Grandfather    History  Substance Use Topics  . Smoking status: Current Some Day Smoker -- 0.12 packs/day for 32 years    Types: Cigarettes  . Smokeless tobacco: Never Used     Comment: 3 -4 cig daily   . Alcohol Use: No    Review of Systems  Constitutional: Positive for diaphoresis. Negative for fever and fatigue.  Respiratory: Positive for chest tightness and shortness of breath. Negative for cough and sputum production.   Cardiovascular: Positive for palpitations. Negative for chest pain.  Gastrointestinal: Negative for nausea, vomiting and abdominal pain.  Genitourinary: Positive for decreased urine volume (ESRD).  Neurological: Negative for light-headedness and headaches.  Psychiatric/Behavioral: Negative for confusion. The patient is nervous/anxious.   All other systems reviewed and are negative.     Allergies  Morphine and related and Tygacil  Home Medications   Prior to Admission medications   Medication Sig Start Date End Date Taking? Authorizing Provider  amLODipine (NORVASC) 5 MG tablet Take 5 mg by mouth daily.  02/22/14  Yes Historical Provider, MD  Cholecalciferol (VITAMIN D PO) Take 1 tablet by mouth every Monday, Wednesday, and Friday. Take at dialysis   Yes Historical Provider, MD  gabapentin (NEURONTIN) 100 MG capsule Take 400 mg by mouth at bedtime.  07/12/14  Yes Monica Carter, DO  glipiZIDE (GLUCOTROL) 5 MG tablet Take 1 tablet (5 mg total) by mouth daily before breakfast. 10/24/13  Yes Mahima Pandey, MD  HYDROcodone-acetaminophen (NORCO/VICODIN) 5-325 MG per tablet Take 1 tablet by mouth every 4 (four) hours as needed for moderate pain.  04/15/14  Yes Historical Provider, MD  ibuprofen (ADVIL,MOTRIN) 200 MG tablet Take 200 mg by mouth daily as needed (pain).   Yes  Historical Provider, MD  oxyCODONE-acetaminophen (PERCOCET/ROXICET) 5-325 MG per tablet Take 1-2 tablets by mouth every 4 (four) hours as needed for severe pain.  06/27/14  Yes Historical Provider, MD  zolpidem (AMBIEN) 10 MG tablet Take 1 tablet (10 mg total) by mouth at bedtime as needed for sleep. 12/28/13  Yes Tiffany L Reed, DO  amitriptyline (ELAVIL) 50 MG tablet Take 1 tablet (50 mg total) by mouth at bedtime. Patient not taking: Reported on 07/13/2014 03/28/13   Blanchie Serve, MD  oseltamivir (TAMIFLU) 30 MG capsule Take 1 capsule (30 mg total) by mouth daily. Only take medication AFTER dialysis x 5 doses 07/12/14   Gildardo Cranker, DO  sildenafil (VIAGRA) 25 MG tablet Take 2 tablets (50 mg total) by mouth daily as needed for erectile dysfunction. Patient not taking: Reported on 07/13/2014 06/27/13   Blanchie Serve, MD   ED Triage  Vitals  Enc Vitals Group     BP 07/13/14 1950 172/113 mmHg     Pulse Rate 07/13/14 1950 152     Resp 07/13/14 1950 37     Temp 07/13/14 2012 98.7 F (37.1 C)     Temp Source 07/13/14 2012 Axillary     SpO2 07/13/14 1950 99 %     Weight 07/13/14 2012 205 lb (92.987 kg)     Height --      Head Cir --      Peak Flow --      Pain Score --      Pain Loc --      Pain Edu? --      Excl. in [place]?  --   Physical Exam  Constitutional: He is oriented to person, place, and time. He appears well-developed and well-nourished. He appears listless. He appears toxic. He appears distressed. Face mask in place.  HENT:  Head: Normocephalic and atraumatic.  Nose: Nose normal.  Mouth/Throat: Oropharynx is clear and moist. No oropharyngeal exudate.  Eyes: EOM are normal. Pupils are equal, round, and reactive to light.  Neck: Normal range of motion. Neck supple.  Cardiovascular: Regular rhythm, normal heart sounds and intact distal pulses.  Tachycardia present.   No murmur heard. Left forearm AVF with + thrill   Pulmonary/Chest: Accessory muscle usage present. Tachypnea noted. He  is in respiratory distress. He has no wheezes. He has rhonchi. He exhibits no tenderness.  Increased work of breathing, tripoding, tachypneic, diffuse rhonchi, while on BiPAP   Abdominal: Soft. He exhibits no distension. There is no tenderness. There is no guarding.  Musculoskeletal: Normal range of motion. He exhibits edema. He exhibits no tenderness.  Neurological: He is oriented to person, place, and time. He appears listless. No cranial nerve deficit. Coordination normal.  Skin: Skin is warm and dry. He is not diaphoretic. No pallor.  Psychiatric: His behavior is normal. Judgment and thought content normal. His mood appears anxious.  Nursing note and vitals reviewed.   ED Course  Procedures (including critical care time) Labs Review Labs Reviewed  CBC WITH DIFFERENTIAL/PLATELET - Abnormal; Notable for the following:    WBC 13.5 (*)    RBC 4.01 (*)    Hemoglobin 10.5 (*)    HCT 34.5 (*)    RDW 17.4 (*)    Neutro Abs 9.8 (*)    All other components within normal limits  COMPREHENSIVE METABOLIC PANEL - Abnormal; Notable for the following:    Potassium 5.5 (*)    CO2 20 (*)    Glucose, Bld 253 (*)    BUN 39 (*)    Creatinine, Ser 6.96 (*)    Calcium 8.7 (*)    Total Protein 8.5 (*)    Albumin 2.8 (*)    ALT 12 (*)    GFR calc non Af Amer 8 (*)    GFR calc Af Amer 9 (*)    All other components within normal limits  I-STAT CG4 LACTIC ACID, ED - Abnormal; Notable for the following:    Lactic Acid, Venous 2.26 (*)    All other components within normal limits  CBG MONITORING, ED - Abnormal; Notable for the following:    Glucose-Capillary 227 (*)    All other components within normal limits  I-STAT CHEM 8, ED - Abnormal; Notable for the following:    Potassium 5.4 (*)    BUN 40 (*)    Creatinine, Ser 7.10 (*)    Glucose, Bld  252 (*)    Calcium, Ion 1.11 (*)    Hemoglobin 11.2 (*)    HCT 33.0 (*)    All other components within normal limits  I-STAT ARTERIAL BLOOD GAS, ED -  Abnormal; Notable for the following:    pCO2 arterial 33.5 (*)    pO2, Arterial 72.0 (*)    Bicarbonate 19.6 (*)    Acid-base deficit 5.0 (*)    All other components within normal limits  CULTURE, BLOOD (ROUTINE X 2)  CULTURE, BLOOD (ROUTINE X 2)  BLOOD GAS, ARTERIAL  LACTIC ACID, PLASMA  HEMOGLOBIN A1C  I-STAT TROPOININ, ED  I-STAT CG4 LACTIC ACID, ED    Imaging Review Dg Chest Portable 1 View  07/13/2014   CLINICAL DATA:  Respiratory distress.  EXAM: PORTABLE CHEST - 1 VIEW  COMPARISON:  03/29/2014  FINDINGS: Numerous leads and wires project over the chest. The Chin overlies the apices. Midline trachea. Cardiomegaly accentuated by AP portable technique. No pleural effusion or pneumothorax. Lower lobe predominant, right worse than left interstitial and airspace disease is new.  IMPRESSION: New lower lobe predominant interstitial and airspace disease. Given absence of pleural effusions, favor multifocal infection or aspiration. Pulmonary edema could look similar.   Electronically Signed   By: Abigail Miyamoto M.D.   On: 07/13/2014 20:29     EKG Interpretation None      MDM   Final diagnoses:  Acute exacerbation of CHF (congestive heart failure)  ESRD (end stage renal disease)   Pt is a 54 yo M with hx of ESRD, CHF, DM, HTN, who presented with acute respiratory distress.  Was SOB at home so family called 65.  EMS found him hypoxic to 64% on room air.  Placed him on a nonrebreather initially and sats improved to 80s, then transitioned to BIPAP and O2 sats increased to 90s.  He was hypertensive to 220/100 and acutely anxious.   Presented to [facility] ED and was taken immediately to the trauma bay.  Looked acutely toxic.  Tripoding, increased work of breathing, tachypneic, tachycardic, anxious and grabbing at his BiPAP mask.  Systolic BP > [id number].   Given NTG sublingual then placed 1 inch of nitropaste on him.  He was transitioned from the EMS BiPAP mask to the RT mask.  Anxiety and work of  breathing slowly improved, which then improved his tachycardia and tachypnea.  ABG was suprisingly ok with 7.37/33/72/19 despite looking acutely toxic.   He was given one dose of ativan to help continue the BiPAP mask.  CXR showed concern for multifocal opacities vs edema.  Will cover with vanc and zosyn for now.    Labs show lactic acidosis to 2.26, K 5.5, Cr 7, trop negative.  Hypertension much improved after initial treatments, down to 130s/70s.  Work of breathing improved significantly as well and pt was able to be transitioned from BiPAP to O2 by [place].  Patient is now speaking in full sentences and is much improved from previously.   Consulted nephrology and spoke to Dr. Melvia Heaps.  Plan to take pt to HD emergently from the ED then to admit to hospitalist service.   Admitted to Dr. Arnoldo Morale with hospitalist team, step down unit appropriate   Labs, EKGs, and imaging were reviewed and interpreted by myself and my attending, and incorporated in the medical decision making.  Patient was seen with ED Attending, Dr. Lucina Mellow, MD   Tori Milks, MD 07/14/14 [id number]  Leonard Schwartz, MD 07/28/14 [phone]

## 2014-07-13 NOTE — ED Notes (Signed)
Family member stated he missed dialysis on Friday (not Wed)  Due to not feeling well and having a fever.  Saw the MD and was told he had the flu (no swab done) and should go home and take Tylenol and go for dialysis this afternoon.  Family member stated he still had a fever today and that is why he did not go to dialysis.  Stated he had missed dialysis for several days in the past and nothing like this happened.  They reported he had CHF in the past several years ago when he weighed over 500 lbs

## 2014-07-13 NOTE — ED Notes (Addendum)
If any issues with family, etc please call [facility] Muss[facility] [phone]

## 2014-07-13 NOTE — ED Notes (Signed)
Patient presents via EMS from the hotel  Patient has been confused with SOB throughout the day.  Upon their arrival found him to be confused, anxious with difficulty breathing.  Sats were 64%.  He missed dialysis on Wednesday.  BP 214/112, tachy

## 2014-07-13 NOTE — ED Notes (Signed)
Instructed patient and spouse they need to be careful and not to argue.  Explained that if his sats drop he will end up on the BiPap again.  Both voiced understanding

## 2014-07-13 NOTE — H&P (Signed)
[facility] Admission History and Physical       Harwood Dinsmoor [id number] DOB: October 10, 1960 DOA: 07/13/2014  Referring physician: EDP PCP: Gildardo Cranker, DO  Specialists:   Chief Complaint: SOB  HPI: Matthew Kline is a 54 y.o. male with history of ESRD on HD (MWF), DM2, HTN, CHF, and PMR who presents to the ED with complaints of worsening SOB over the past 24 hours.   He reports that he missed dialysis yesterday.  He reports having cough and malaise and Myalgias  For the past 3-4 days and was diagnosed with the Flu by his PCP and placed on Rx.     In the ED on arrival he was found to have decreased O2 sats in the 60's.   He was placed on BiPAP in the ED, and Nitropaste was applied and he began to breathe better.   A sepsis workup was also initiated and he was administered IV Vancomycin and  Zosyn.  Renal was consulted and patient was taken for emergent dialysis.    He was admitted to the [facility].   He denies any chest pain.      Review of Systems: Constitutional: No Weight Loss, No Weight Gain, Night Sweats, Fevers, Chills, Dizziness, Light Headedness, Fatigue, + Generalized Weakness HEENT: No Headaches, Difficulty Swallowing,Tooth/Dental Problems,Sore Throat,  No Sneezing, Rhinitis, Ear Ache, Nasal Congestion, or Post Nasal Drip,  Cardio-vascular:  No Chest pain, Orthopnea, PND, Edema in Lower Extremities, Anasarca, Dizziness, Palpitations  Resp: +Dyspnea, No DOE, No Productive Cough, No Non-Productive Cough, No Hemoptysis, No Wheezing.    GI: No Heartburn, Indigestion, Abdominal Pain, Nausea, Vomiting, Diarrhea, Constipation, Hematemesis, Hematochezia, Melena, Change in Bowel Habits,  Loss of Appetite  GU: No Dysuria, No Change in Color of Urine, No Urgency or Urinary Frequency, No Flank pain.  Musculoskeletal: No Joint Pain or Swelling, No Decreased Range of Motion, No Back Pain.  Neurologic: No Syncope, No Seizures, Muscle Weakness, Paresthesia, Vision  Disturbance or Loss, No Diplopia, No Vertigo, No Difficulty Walking,  Skin: No Rash or Lesions. Psych: No Change in Mood or Affect, No Depression or Anxiety, No Memory loss, No Confusion, or Hallucinations   Past Medical History  Diagnosis Date  . Polymyalgia rheumatica   . Hypertension   . Diabetic neuropathy   . Osteomyelitis of foot, left, acute   . Anxiety   . Insomnia, unspecified   . Unspecified vitamin D deficiency   . Anemia, unspecified   . Other chronic postoperative pain   . Unspecified hereditary and idiopathic peripheral neuropathy   . Allergy   . Unspecified osteomyelitis, site unspecified   . Long term (current) use of anticoagulants   . 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 Ulcer     diabetic foot   . Arthralgia 2010    polyarticular  . Hemorrhoids, internal 10/2011    small  . CHF (congestive heart failure)   . CHF (congestive heart failure) 07/25/2009    denies  . Hemodialysis access site with mature fistula   . Myocardial infarction 1995  . Pneumonia     "probably 4-5 times" (05/09/2014)  . Sleep apnea     "lost weight; no more problem" (05/09/2014)  . Type II diabetes mellitus dx'd 1995  . History of blood transfusion     "related to the anemia"  . GERD (gastroesophageal reflux disease)     hx "before I lost weight"  . ESRD (end stage renal disease) on dialysis  started 12/2012; "MWF; [facility]"  . Stroke 01/10/06    denies residual on 05/09/2014  . Arthritis     "back, knees" (05/09/2014)  . Chronic lower back pain      Past Surgical History  Procedure Laterality Date  . Amputation  01/21/2012    Procedure: AMPUTATION RAY;  Surgeon: [name], MD;  Location: [place];  Service: Orthopedics;  Laterality: Left;  Left Foot 4th Ray Amputation  . Anterior cervical decomp/discectomy fusion  02/2011  . Knee arthroscopy Left 08-25-2012  . Toe amputation Bilateral     "I've lost 7 toes over the last 7 years" (05/09/2014)  . Refractive  surgery Bilateral   . Bascilic vein transposition Left 10/19/2012    Procedure: BASCILIC VEIN TRANSPOSITION;  Surgeon: Serafina Mitchell, MD;  Location: [place];  Service: Vascular;  Laterality: Left;  . Tonsillectomy    . Amputation Left 05/04/2013    Procedure: AMPUTATION DIGIT;  Surgeon: [name], MD;  Location: [place];  Service: Orthopedics;  Laterality: Left;  Left Great Toe Amputation at [facility]  . Toe surgery Left April 2015    Big toe removed on left foot.  . Total knee arthroplasty Left 04/10/2014    Procedure: TOTAL KNEE ARTHROPLASTY;  Surgeon: [name], MD;  Location: [place];  Service: Orthopedics;  Laterality: Left;  . Wound debridement Left 05/09/2014    Dehiscence Left Total Knee Arthroplasty Incision  . Back surgery    . Uvulopalatopharyngoplasty, tonsillectomy and septoplasty  ~ 1989  . I&d extremity Left 05/09/2014    Procedure: Irrigation and Debridement Left Knee and Closure of Total Knee Arthroplasty Incision;  Surgeon: [name], MD;  Location: [place];  Service: Orthopedics;  Laterality: Left;  . I&d knee with poly exchange Left 05/31/2014    Procedure: IRRIGATION AND DEBRIDEMENT LEFT KNEE, PLACE ANTIBIOTIC BEADS,  POLY EXCHANGE;  Surgeon: [name], MD;  Location: [place];  Service: Orthopedics;  Laterality: Left;      Prior to Admission medications   Medication Sig Start Date End Date Taking? Authorizing Provider  amLODipine (NORVASC) 5 MG tablet Take 5 mg by mouth daily.  02/22/14  Yes Historical Provider, MD  Cholecalciferol (VITAMIN D PO) Take 1 tablet by mouth every Monday, Wednesday, and Friday. Take at dialysis   Yes Historical Provider, MD  gabapentin (NEURONTIN) 100 MG capsule Take 400 mg by mouth at bedtime.  07/12/14  Yes Monica Carter, DO  glipiZIDE (GLUCOTROL) 5 MG tablet Take 1 tablet (5 mg total) by mouth daily before breakfast. 10/24/13  Yes Mahima Pandey, MD  HYDROcodone-acetaminophen (NORCO/VICODIN) 5-325 MG per tablet Take 1 tablet by mouth every 4  (four) hours as needed for moderate pain.  04/15/14  Yes Historical Provider, MD  ibuprofen (ADVIL,MOTRIN) 200 MG tablet Take 200 mg by mouth daily as needed (pain).   Yes Historical Provider, MD  oxyCODONE-acetaminophen (PERCOCET/ROXICET) 5-325 MG per tablet Take 1-2 tablets by mouth every 4 (four) hours as needed for severe pain.  06/27/14  Yes Historical Provider, MD  zolpidem (AMBIEN) 10 MG tablet Take 1 tablet (10 mg total) by mouth at bedtime as needed for sleep. 12/28/13  Yes Tiffany L Reed, DO  amitriptyline (ELAVIL) 50 MG tablet Take 1 tablet (50 mg total) by mouth at bedtime. Patient not taking: Reported on 07/13/2014 03/28/13   Blanchie Serve, MD  oseltamivir (TAMIFLU) 30 MG capsule Take 1 capsule (30 mg total) by mouth daily. Only take medication AFTER dialysis x  5 doses 07/12/14   Gildardo Cranker, DO  sildenafil (VIAGRA) 25 MG tablet Take 2 tablets (50 mg total) by mouth daily as needed for erectile dysfunction. Patient not taking: Reported on 07/13/2014 06/27/13   Blanchie Serve, MD     Allergies  Allergen Reactions  . Morphine And Related Other (See Comments)    hallucinations  . Tygacil [Tigecycline] Other (See Comments)    Makes him feel crazy    Social History:  reports that he has been smoking Cigarettes.  He has a 3.84 pack-year smoking history. He has never used smokeless tobacco. He reports that he does not drink alcohol or use illicit drugs.    Family History  Problem Relation Age of Onset  . Hypertension Mother   . Cancer Mother 4    Ovarian  . Heart disease Maternal Aunt   . Stroke Maternal Grandfather        Physical Exam:  GEN:  Obese  54 y.o. Caucasian male examined and in no acute distress; cooperative with exam Filed Vitals:   07/13/14 2200 07/13/14 2205 07/13/14 2215 07/13/14 2220  BP: 133/64 133/66 137/76 137/72  Pulse: 116 115 115   Temp:      TempSrc:      Resp: 28 40 19 30  Weight:      SpO2: 95% 93% 90%    Blood pressure 137/72, pulse 115,  temperature 98.7 F (37.1 C), temperature source Axillary, resp. rate 30, weight 92.987 kg (205 lb), SpO2 90 %. PSYCH: She is alert and oriented x4; does not appear anxious does not appear depressed; affect is normal HEENT: Normocephalic and Atraumatic, Mucous membranes pink; PERRLA; EOM intact; Fundi:  Benign;  No scleral icterus, Nares: Patent, Oropharynx: Clear, Fair Dentition,    Neck:  FROM, No Cervical Lymphadenopathy nor Thyromegaly or Carotid Bruit; No JVD; Breasts:: Not examined CHEST WALL: No tenderness CHEST: Normal respiration, clear to auscultation bilaterally HEART: Regular rate and rhythm; no murmurs rubs or gallops BACK: No kyphosis or scoliosis; No CVA tenderness ABDOMEN: Positive Bowel Sounds, Obese, Soft Non-Tender, No Rebound or Guarding; No Masses, No Organomegaly Rectal Exam: Not done EXTREMITIES: Amputations of Toes #2 and #4 on Left Foot,  No cyanosis, Clubbing, or Edema; No Ulcerations. Genitalia: not examined PULSES: 2+ and symmetric SKIN: Normal hydration no rash or ulceration CNS:  Alert and Oriented x 4, No Focal Deficits Vascular: pulses palpable throughout    Labs on Admission:  Basic Metabolic Panel:  Recent Labs Lab 07/13/14 2015 07/13/14 2018  NA 137 138  K 5.5* 5.4*  CL 104 107  CO2 20*  --   GLUCOSE 253* 252*  BUN 39* 40*  CREATININE 6.96* 7.10*  CALCIUM 8.7*  --    Liver Function Tests:  Recent Labs Lab 07/13/14 2015  AST 19  ALT 12*  ALKPHOS 89  BILITOT 0.6  PROT 8.5*  ALBUMIN 2.8*   No results for input(s): LIPASE, AMYLASE in the last 168 hours. No results for input(s): AMMONIA in the last 168 hours. CBC:  Recent Labs Lab 07/13/14 2015 07/13/14 2018  WBC 13.5*  --   NEUTROABS 9.8*  --   HGB 10.5* 11.2*  HCT 34.5* 33.0*  MCV 86.0  --   PLT 280  --    Cardiac Enzymes: No results for input(s): CKTOTAL, CKMB, CKMBINDEX, TROPONINI in the last 168 hours.  BNP (last 3 results) No results for input(s): BNP in the last  8760 hours.  ProBNP (last 3 results) No results for  input(s): PROBNP in the last 8760 hours.  CBG:  Recent Labs Lab 07/13/14 2022  GLUCAP 227*    Radiological Exams on Admission: Dg Chest Portable 1 View  07/13/2014   CLINICAL DATA:  Respiratory distress.  EXAM: PORTABLE CHEST - 1 VIEW  COMPARISON:  03/29/2014  FINDINGS: Numerous leads and wires project over the chest. The Chin overlies the apices. Midline trachea. Cardiomegaly accentuated by AP portable technique. No pleural effusion or pneumothorax. Lower lobe predominant, right worse than left interstitial and airspace disease is new.  IMPRESSION: New lower lobe predominant interstitial and airspace disease. Given absence of pleural effusions, favor multifocal infection or aspiration. Pulmonary edema could look similar.   Electronically Signed   By: Abigail Miyamoto M.D.   On: 07/13/2014 20:29          Assessment/Plan:      54 y.o. male with  Principal Problem:   1.    Acute exacerbation of CHF (congestive heart failure)   EMergentr Dialysis   BIPAP PRN   Active Problems:   2.   Acute respiratory failure with hypoxemia   O2/BiPAPA PRN     3.   Sepsis   Sepsis Workup   IV Vancomycin and Zosyn  Ordered   Continue Ramiflu Rx        4.   Hyperkalemia   Correction with Dialysis     5.   Essential hypertension, benign   Continue Amlodipine as BP tolerates   Monitor BPs     6.   ESRD (end stage renal disease)   Renal Team Consulted   Dialysis this evening and on MWF and PRN     7.   Type II diabetes mellitus   Hold    SSI coverage PRN     8.   Anemia in chronic kidney disease   Monitor Hemoglobin     9.   DVT Prophylaxis   SQ Heparin    Code Status:     FULL CODE      Family Communication:    No Family Present    Disposition Plan:    Inpatient  Status        Time spent:  [facility] Pager [phone]   If [place] Please Contact the Day Rounding Team MD for  [facility]  If 7PM-7AM, Please Contact Night-Floor Coverage  [email or web address] Password [email or web address] 07/13/2014, 11:07 PM     ADDENDUM:   Patient was seen and examined on 07/13/2014

## 2014-07-13 NOTE — Progress Notes (Signed)
ANTIBIOTIC CONSULT NOTE - INITIAL  Pharmacy Consult for Vancocin and Zosyn Indication: rule out sepsis  Allergies  Allergen Reactions  . Morphine And Related Other (See Comments)    hallucinations  . Tygacil [Tigecycline] Other (See Comments)    Makes him feel crazy    Patient Measurements: Weight: 205 lb (92.987 kg)  Vital Signs: Temp: 98.7 F (37.1 C) (07/02 2012) Temp Source: Axillary (07/02 2012) BP: 137/72 mmHg (07/02 2220) Pulse Rate: 115 (07/02 2215)  Labs:  Recent Labs  07/13/14 2015 07/13/14 2018  WBC 13.5*  --   HGB 10.5* 11.2*  PLT 280  --   CREATININE 6.96* 7.10*   Estimated Creatinine Clearance: 13.7 mL/min (by C-G formula based on Cr of 7.1).   Medical History: Past Medical History  Diagnosis Date  . Polymyalgia rheumatica   . Hypertension   . Diabetic neuropathy   . Osteomyelitis of foot, left, acute   . Anxiety   . Insomnia, unspecified   . Unspecified vitamin D deficiency   . Anemia, unspecified   . Other chronic postoperative pain   . Unspecified hereditary and idiopathic peripheral neuropathy   . Allergy   . Unspecified osteomyelitis, site unspecified   . Long term (current) use of anticoagulants   . 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 Ulcer     diabetic foot   . Arthralgia 2010    polyarticular  . Hemorrhoids, internal 10/2011    small  . CHF (congestive heart failure)   . CHF (congestive heart failure) 07/25/2009    denies  . Hemodialysis access site with mature fistula   . Myocardial infarction 1995  . Pneumonia     "probably 4-5 times" (05/09/2014)  . Sleep apnea     "lost weight; no more problem" (05/09/2014)  . Type II diabetes mellitus dx'd 1995  . History of blood transfusion     "related to the anemia"  . GERD (gastroesophageal reflux disease)     hx "before I lost weight"  . ESRD (end stage renal disease) on dialysis     started 12/2012; "MWF; [facility]"  . Stroke 01/10/06    denies residual on  05/09/2014  . Arthritis     "back, knees" (05/09/2014)  . Chronic lower back pain     Assessment: 54yo male presents w/ SOB and confusion, missed HD on Wed and Fri, found to be hypertensive and tachycardic, concern for sepsis, to begin IV ABX.  Goal of Therapy:  Pre-HD vanc level 15-25  Plan:  Rec'd vanc 1750mg  and Zosyn 3.375g IV in ED; will continue with vancomycin 1000mg  IV after each HD and Zosyn 2.25g IV Q8H and monitor CBC, Cx, levels prn.  Wynona Neat, PharmD, BCPS  07/13/2014,11:51 PM

## 2014-07-13 NOTE — Consult Note (Signed)
Renal Service Consult Note [facility]  Matthew Kline 07/13/2014 [place] D Requesting Physician:  Dr Sharion Settler  Reason for Consult:  ESRD patient with  HPI: The patient is a 54 y.o. year-old with hx of HTN, PMR, DM2, CVA and ESRD on MWF nocturnal HD.  Missed HD yesterday/ last night and presents to ED today with severe SOB/ dyspnea, and confusion. In ED patient was anxious, SpO2 was 64% on RA, BP 214/ 112. Treated with bipap and NTG paste and is feeling much better, breathing greatly improved. CXR shows significatn bilat IS and some alveolar edema.   Recently has had problems with infected L knee prosthesis and is currently on 6 wk course of abx at HD for this. The knee has been doing well.   Past Medical History  Past Medical History  Diagnosis Date  . Polymyalgia rheumatica   . Hypertension   . Diabetic neuropathy   . Osteomyelitis of foot, left, acute   . Anxiety   . Insomnia, unspecified   . Unspecified vitamin D deficiency   . Anemia, unspecified   . Other chronic postoperative pain   . Unspecified hereditary and idiopathic peripheral neuropathy   . Allergy   . Unspecified osteomyelitis, site unspecified   . Long term (current) use of anticoagulants   . 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 Ulcer     diabetic foot   . Arthralgia 2010    polyarticular  . Hemorrhoids, internal 10/2011    small  . CHF (congestive heart failure)   . CHF (congestive heart failure) 07/25/2009    denies  . Hemodialysis access site with mature fistula   . Myocardial infarction 1995  . Pneumonia     "probably 4-5 times" (05/09/2014)  . Sleep apnea     "lost weight; no more problem" (05/09/2014)  . Type II diabetes mellitus dx'd 1995  . History of blood transfusion     "related to the anemia"  . GERD (gastroesophageal reflux disease)     hx "before I lost weight"  . ESRD (end stage renal disease) on dialysis     started 12/2012; "MWF; [employer] [employer]"  .  Stroke 01/10/06    denies residual on 05/09/2014  . Arthritis     "back, knees" (05/09/2014)  . Chronic lower back pain    Past Surgical History  Past Surgical History  Procedure Laterality Date  . Amputation  01/21/2012    Procedure: AMPUTATION RAY;  Surgeon: [name], MD;  Location: [place];  Service: Orthopedics;  Laterality: Left;  Left Foot 4th Ray Amputation  . Anterior cervical decomp/discectomy fusion  02/2011  . Knee arthroscopy Left 08-25-2012  . Toe amputation Bilateral     "I've lost 7 toes over the last 7 years" (05/09/2014)  . Refractive surgery Bilateral   . Bascilic vein transposition Left 10/19/2012    Procedure: BASCILIC VEIN TRANSPOSITION;  Surgeon: Serafina Mitchell, MD;  Location: [place];  Service: Vascular;  Laterality: Left;  . Tonsillectomy    . Amputation Left 05/04/2013    Procedure: AMPUTATION DIGIT;  Surgeon: [name], MD;  Location: [place];  Service: Orthopedics;  Laterality: Left;  Left Great Toe Amputation at [facility]  . Toe surgery Left April 2015    Big toe removed on left foot.  . Total knee arthroplasty Left 04/10/2014    Procedure: TOTAL KNEE ARTHROPLASTY;  Surgeon: [name], MD;  Location: [place];  Service: Orthopedics;  Laterality: Left;  .  Wound debridement Left 05/09/2014    Dehiscence Left Total Knee Arthroplasty Incision  . Back surgery    . Uvulopalatopharyngoplasty, tonsillectomy and septoplasty  ~ 1989  . I&d extremity Left 05/09/2014    Procedure: Irrigation and Debridement Left Knee and Closure of Total Knee Arthroplasty Incision;  Surgeon: [name], MD;  Location: [place];  Service: Orthopedics;  Laterality: Left;  . I&d knee with poly exchange Left 05/31/2014    Procedure: IRRIGATION AND DEBRIDEMENT LEFT KNEE, PLACE ANTIBIOTIC BEADS,  POLY EXCHANGE;  Surgeon: [name], MD;  Location: [place];  Service: Orthopedics;  Laterality: Left;   Family History  Family History  Problem Relation Age of Onset  . Hypertension Mother   . Cancer  Mother 46    Ovarian  . Heart disease Maternal Aunt   . Stroke Maternal Grandfather    Social History  reports that he has been smoking Cigarettes.  He has a 3.84 pack-year smoking history. He has never used smokeless tobacco. He reports that he does not drink alcohol or use illicit drugs. Allergies  Allergies  Allergen Reactions  . Morphine And Related     hallucinations  . Tygacil [Tigecycline] Nausea And Vomiting, Nausea Only and Other (See Comments)   Home medications Prior to Admission medications   Medication Sig Start Date End Date Taking? Authorizing Provider  amitriptyline (ELAVIL) 50 MG tablet Take 1 tablet (50 mg total) by mouth at bedtime. 03/28/13   Blanchie Serve, MD  amLODipine (NORVASC) 5 MG tablet Take 1 tablet by mouth daily. 02/22/14   Historical Provider, MD  calcium acetate (PHOSLO) 667 MG capsule Take 667 mg by mouth 3 (three) times daily with meals.    Historical Provider, MD  Cholecalciferol (VITAMIN D PO) Take 1 tablet by mouth 3 (three) times a week. At dialysis-MWF    Historical Provider, MD  gabapentin (NEURONTIN) 100 MG capsule Take 2 capsules (200 mg total) by mouth daily. 07/12/14   Gildardo Cranker, DO  glipiZIDE (GLUCOTROL) 5 MG tablet Take 1 tablet (5 mg total) by mouth daily before breakfast. 10/24/13   Blanchie Serve, MD  HYDROcodone-acetaminophen (NORCO/VICODIN) 5-325 MG per tablet Take 1 tablet by mouth every 4 (four) hours as needed for moderate pain.  04/15/14   Historical Provider, MD  oseltamivir (TAMIFLU) 30 MG capsule Take 1 capsule (30 mg total) by mouth daily. Only take medication AFTER dialysis x 5 doses 07/12/14   Gildardo Cranker, DO  ranitidine (ZANTAC) 150 MG tablet Take 150 mg by mouth daily as needed for heartburn.     Historical Provider, MD  sildenafil (VIAGRA) 25 MG tablet Take 2 tablets (50 mg total) by mouth daily as needed for erectile dysfunction. 06/27/13   Blanchie Serve, MD  zolpidem (AMBIEN) 10 MG tablet Take 1 tablet (10 mg total) by mouth at  bedtime as needed for sleep. 12/28/13   Gayland Curry, DO   Liver Function Tests  Recent Labs Lab 07/13/14 2015  AST 19  ALT 12*  ALKPHOS 89  BILITOT 0.6  PROT 8.5*  ALBUMIN 2.8*   No results for input(s): LIPASE, AMYLASE in the last 168 hours. CBC  Recent Labs Lab 07/13/14 2015 07/13/14 2018  WBC 13.5*  --   NEUTROABS 9.8*  --   HGB 10.5* 11.2*  HCT 34.5* 33.0*  MCV 86.0  --   PLT 280  --    Basic Metabolic Panel  Recent Labs Lab 07/13/14 2015 07/13/14 2018  NA 137 138  K  5.5* 5.4*  CL 104 107  CO2 20*  --   GLUCOSE 253* 252*  BUN 39* 40*  CREATININE 6.96* 7.10*  CALCIUM 8.7*  --     Filed Vitals:   07/13/14 2045 07/13/14 2050 07/13/14 2055 07/13/14 2129  BP: 165/91 144/95 155/85   Pulse: 134 131 130 124  Temp:      TempSrc:      Resp: 37 27 29 39  Weight:      SpO2: 100% 100% 100% 99%   Exam On bipap, comfortable No rash, cyanosis or gangrene Sclera anicteric, throat clear +JVD Chest bilat rales 1/3 up post RRR no MRG Abd obese, soft ntnd no mass or ascites GU normal male L knee scar intact, no erythema/ effusions 1-2+ pitting LE edema bilat Bilat toe amps Neuro is nf, Ox 3 LUA AVF patent  CXR bilat pulm edema   MWF [facility] nocturnal   8h   250/500  LUA AVF  Bath 2/2  Hep 4k then 5k midRx  92kg Venofer 50/ wk, calcitriol 0.75 ug, Mircera 225 q 2wks  Assessment: 1. Acute resp failure / pulm edema - due to volume overload / missed HD yest 2. ESRD on nocturnal HD MWF, dry wt 92kg 3. Volume excess / HTN crisis - better w topical NTP 4. DM 2 on insulin/ januvia 5. Hx CVA 6. Anemia cont meds 7. MBD cont meds   Plan- HD acutely tonight, max UF, get wt's after HD.   Kelly Splinter MD (pgr) [phone]    ([phone] 07/13/2014, 9:35 PM

## 2014-07-14 ENCOUNTER — Inpatient Hospital Stay (HOSPITAL_COMMUNITY): Payer: Commercial Managed Care - HMO

## 2014-07-14 DIAGNOSIS — A419 Sepsis, unspecified organism: Secondary | ICD-10-CM | POA: Diagnosis present

## 2014-07-14 DIAGNOSIS — N189 Chronic kidney disease, unspecified: Secondary | ICD-10-CM

## 2014-07-14 DIAGNOSIS — E119 Type 2 diabetes mellitus without complications: Secondary | ICD-10-CM

## 2014-07-14 DIAGNOSIS — N186 End stage renal disease: Secondary | ICD-10-CM | POA: Diagnosis present

## 2014-07-14 DIAGNOSIS — D631 Anemia in chronic kidney disease: Secondary | ICD-10-CM | POA: Diagnosis present

## 2014-07-14 DIAGNOSIS — Z992 Dependence on renal dialysis: Secondary | ICD-10-CM

## 2014-07-14 LAB — CBC
HCT: 28.9 % — ABNORMAL LOW (ref 39.0–52.0)
HEMOGLOBIN: 9 g/dL — AB (ref 13.0–17.0)
MCH: 26.4 pg (ref 26.0–34.0)
MCHC: 31.1 g/dL (ref 30.0–36.0)
MCV: 84.8 fL (ref 78.0–100.0)
PLATELETS: 242 10*3/uL (ref 150–400)
RBC: 3.41 MIL/uL — ABNORMAL LOW (ref 4.22–5.81)
RDW: 17.4 % — ABNORMAL HIGH (ref 11.5–15.5)
WBC: 8.9 10*3/uL (ref 4.0–10.5)

## 2014-07-14 LAB — BASIC METABOLIC PANEL
ANION GAP: 14 (ref 5–15)
BUN: 24 mg/dL — AB (ref 6–20)
CO2: 22 mmol/L (ref 22–32)
Calcium: 8.5 mg/dL — ABNORMAL LOW (ref 8.9–10.3)
Chloride: 101 mmol/L (ref 101–111)
Creatinine, Ser: 4.32 mg/dL — ABNORMAL HIGH (ref 0.61–1.24)
GFR calc Af Amer: 16 mL/min — ABNORMAL LOW (ref >60)
GFR calc non Af Amer: 14 mL/min — ABNORMAL LOW (ref >60)
Glucose, Bld: 205 mg/dL — ABNORMAL HIGH (ref 65–99)
Potassium: 4.2 mmol/L (ref 3.5–5.1)
SODIUM: 137 mmol/L (ref 135–145)

## 2014-07-14 LAB — GLUCOSE, CAPILLARY
GLUCOSE-CAPILLARY: 187 mg/dL — AB (ref 65–99)
Glucose-Capillary: 167 mg/dL — ABNORMAL HIGH (ref 65–99)

## 2014-07-14 LAB — MRSA PCR SCREENING: MRSA BY PCR: NEGATIVE

## 2014-07-14 MED ORDER — PIPERACILLIN-TAZOBACTAM IN DEX 2-0.25 GM/50ML IV SOLN
2.2500 g | Freq: Three times a day (TID) | INTRAVENOUS | Status: DC
  Filled 2014-07-14 (×4): qty 50

## 2014-07-14 MED ORDER — NITROGLYCERIN 2 % TD OINT
1.0000 [in_us] | TOPICAL_OINTMENT | Freq: Four times a day (QID) | TRANSDERMAL | Status: DC
  Administered 2014-07-14: 1 [in_us] via TOPICAL
  Filled 2014-07-14: qty 30

## 2014-07-14 MED ORDER — AMLODIPINE BESYLATE 5 MG PO TABS
5.0000 mg | ORAL_TABLET | Freq: Every day | ORAL | Status: DC
  Administered 2014-07-14: 5 mg via ORAL
  Filled 2014-07-14: qty 1

## 2014-07-14 MED ORDER — ACETAMINOPHEN 325 MG PO TABS: 650.0000 mg | ORAL_TABLET | Freq: Four times a day (QID) | ORAL | Status: DC | PRN

## 2014-07-14 MED ORDER — SODIUM CHLORIDE 0.9 % IJ SOLN
3.0000 mL | Freq: Two times a day (BID) | INTRAMUSCULAR | Status: DC
  Administered 2014-07-14: 3 mL via INTRAVENOUS

## 2014-07-14 MED ORDER — ONDANSETRON HCL 4 MG/2ML IJ SOLN: 4.0000 mg | Freq: Four times a day (QID) | INTRAMUSCULAR | Status: DC | PRN

## 2014-07-14 MED ORDER — HEPARIN SODIUM (PORCINE) 5000 UNIT/ML IJ SOLN
5000.0000 [IU] | Freq: Three times a day (TID) | INTRAMUSCULAR | Status: DC
  Filled 2014-07-14 (×3): qty 1

## 2014-07-14 MED ORDER — ALUM & MAG HYDROXIDE-SIMETH 200-200-20 MG/5ML PO SUSP: 30.0000 mL | Freq: Four times a day (QID) | ORAL | Status: DC | PRN

## 2014-07-14 MED ORDER — HYDROMORPHONE HCL 1 MG/ML IJ SOLN: 0.5000 mg | INTRAMUSCULAR | Status: DC | PRN

## 2014-07-14 MED ORDER — ACETAMINOPHEN 650 MG RE SUPP: 650.0000 mg | Freq: Four times a day (QID) | RECTAL | Status: DC | PRN

## 2014-07-14 MED ORDER — SODIUM CHLORIDE 0.9 % IJ SOLN: 3.0000 mL | INTRAMUSCULAR | Status: DC | PRN

## 2014-07-14 MED ORDER — VANCOMYCIN HCL IN DEXTROSE 1-5 GM/200ML-% IV SOLN
1000.0000 mg | INTRAVENOUS | Status: DC
  Filled 2014-07-14: qty 200

## 2014-07-14 MED ORDER — SODIUM CHLORIDE 0.9 % IV SOLN: 250.0000 mL | INTRAVENOUS | Status: DC | PRN

## 2014-07-14 MED ORDER — ENOXAPARIN SODIUM 100 MG/ML ~~LOC~~ SOLN: 1.0000 mg/kg | Freq: Two times a day (BID) | SUBCUTANEOUS | Status: DC

## 2014-07-14 MED ORDER — OSELTAMIVIR PHOSPHATE 30 MG PO CAPS
30.0000 mg | ORAL_CAPSULE | ORAL | Status: DC
  Administered 2014-07-14: 30 mg via ORAL
  Filled 2014-07-14: qty 1

## 2014-07-14 MED ORDER — OXYCODONE HCL 5 MG PO TABS: 5.0000 mg | ORAL_TABLET | ORAL | Status: DC | PRN

## 2014-07-14 MED ORDER — VANCOMYCIN HCL 500 MG IV SOLR
500.0000 mg | INTRAVENOUS | Status: DC
  Filled 2014-07-14: qty 500

## 2014-07-14 MED ORDER — ONDANSETRON HCL 4 MG PO TABS: 4.0000 mg | ORAL_TABLET | Freq: Four times a day (QID) | ORAL | Status: DC | PRN

## 2014-07-14 NOTE — Progress Notes (Addendum)
  [facility] Progress Note   Subjective: feels much better, SOB resolved. 5 kg off with HD last night, no BP drops but lots of cramping per patient.   Filed Vitals:   07/14/14 0300 07/14/14 0330 07/14/14 0340 07/14/14 0430  BP: 130/74 155/96 159/84 152/83  Pulse: 86 90 93 97  Temp:   97.7 F (36.5 C) 97.4 F (36.3 C)  TempSrc:   Oral Oral  Resp: 17 18 18 17   Height:    5\' 11"  (1.803 m)  Weight:   87.2 kg (192 lb 3.9 oz) 87.3 kg (192 lb 7.4 oz)  SpO2: 100% 100% 100% 96%   Exam: Alert, no distress No jvd Chest minimal faint rales R base, otherwise clear throughout RRR no MRG Abd soft, NTND no ascites Ext trace LE edema, L knee wounds are stable Bilat toe amps Neuro is alert, ox 3 LUA AVF patent  MWF [facility] nocturnal 8h 250/500 LUA AVF Bath 2/2 Hep 4k then 5k midRx 92kg Venofer 50/ wk, calcitriol 0.75 ug, Mircera 225 q 2wks   Assessment: 1. Pulm edema/resp failure - much better, cxr back to normal 2. ESRD on HD MWF 3. Vol excess - has lost body wt 4. DM on insulin 5. Anemia 6. MBD  Plan - ok for dc from renal standpoint    Kelly Splinter MD  pager [phone]    cell [phone]  07/14/2014, 10:08 AM     Recent Labs Lab 07/13/14 2015 07/13/14 2018 07/14/14 0621  NA 137 138 137  K 5.5* 5.4* 4.2  CL 104 107 101  CO2 20*  --  22  GLUCOSE 253* 252* 205*  BUN 39* 40* 24*  CREATININE 6.96* 7.10* 4.32*  CALCIUM 8.7*  --  8.5*    Recent Labs Lab 07/13/14 2015  AST 19  ALT 12*  ALKPHOS 89  BILITOT 0.6  PROT 8.5*  ALBUMIN 2.8*    Recent Labs Lab 07/13/14 2015 07/13/14 2018 07/14/14 0621  WBC 13.5*  --  8.9  NEUTROABS 9.8*  --   --   HGB 10.5* 11.2* 9.0*  HCT 34.5* 33.0* 28.9*  MCV 86.0  --  84.8  PLT 280  --  242   . amLODipine  5 mg Oral Daily  . heparin  5,000 Units Subcutaneous 3 times per day  . insulin aspart  0-9 Units Subcutaneous 6 times per day  . nitroGLYCERIN  1 inch Topical 4 times per day  . oseltamivir  30 mg  Oral Q M,W,F-2000  . piperacillin-tazobactam (ZOSYN)  IV  2.25 g Intravenous Q8H  . sodium chloride  3 mL Intravenous Q12H  . vancomycin  1,000 mg Intravenous Q M,W,F-HD     sodium chloride, acetaminophen **OR** acetaminophen, albuterol, alum & mag hydroxide-simeth, HYDROmorphone (DILAUDID) injection, nitroGLYCERIN, ondansetron **OR** ondansetron (ZOFRAN) IV, oxyCODONE, sodium chloride

## 2014-07-14 NOTE — Discharge Summary (Signed)
[facility]  Physician Discharge Summary   [name] MRN: [id number] DOB/AGE: [birth year] 54 y.o.  Admit date: 07/13/2014 Discharge date: 07/14/2014  PCP: Gildardo Cranker, DO  DISCHARGE DIAGNOSES:  Principal Problem:   Acute exacerbation of CHF (congestive heart failure) Active Problems:   Hyperkalemia   Essential hypertension, benign   Acute respiratory failure with hypoxemia   Sepsis   ESRD (end stage renal disease)   Type II diabetes mellitus   Anemia in chronic kidney disease   RECOMMENDATIONS FOR OUTPATIENT FOLLOW UP: 1. Patient to resume outpatient dialysis starting tomorrow evening 2. Patient asked to continue taking his Tamiflu as instructed by his primary care provider   DISCHARGE CONDITION: fair  Diet recommendation: Modified carbohydrate  Filed Weights   07/14/14 0340 07/14/14 0430 07/14/14 0800  Weight: 87.2 kg (192 lb 3.9 oz) 87.3 kg (192 lb 7.4 oz) 87.2 kg (192 lb 3.9 oz)    INITIAL HISTORY: 54 year old Caucasian male with a past medical history of end-stage renal disease on dialysis, diabetes, hypertension, who missed his dialysis session on Friday. He presented yesterday with complaints of worsening shortness of breath. He was recently seen by his primary care provider for 3 day history of cough and was started on Tamiflu. In the emergency department x-ray suggested pulmonary edema. Patient was dialyzed emergently last night. And was subsequently hospitalized.  Consultations:  Nephrology  Procedures:  Hemodialysis  HOSPITAL COURSE:   Patient was admitted the to the hospital. His presentation was thought to be secondary to him having missed a dialysis session. Due to his shortness of breath and pulmonary edema noted on chest x-ray he was emergently dialyzed yesterday. Repeat chest x-ray this morning showed resolution of his pulmonary edema. There was initially a suspicion of pneumonia and he was started on broad-spectrum  antibiotics. Considering rapid resolution of his symptoms as well as findings on chest x-ray, this is unlikely to be an infectious process. He was started recently by his primary care physician on Tamiflu. He will be asked to complete the course. He is also getting vancomycin with his outpatient dialysis for knee infection, which he will continue getting as well.  His other medical issues are all stable. His potassium corrected after dialysis. Blood sugars are reasonably well controlled. Blood pressure is stable. His knee appears to be stable as well. He knows to go for his usual dialysis session starting tomorrow. Discussed with nephrology. Cleared for discharge.  Discussed with the patient and his wife today. Will be discharged home.   PERTINENT LABS:  The results of significant diagnostics from this hospitalization (including imaging, microbiology, ancillary and laboratory) are listed below for reference.    Microbiology: Recent Results (from the past 240 hour(s))  MRSA PCR Screening     Status: None   Collection Time: 07/14/14  4:25 AM  Result Value Ref Range Status   MRSA by PCR NEGATIVE NEGATIVE Final    Comment:        The GeneXpert MRSA Assay (FDA approved for NASAL specimens only), is one component of a comprehensive MRSA colonization surveillance program. It is not intended to diagnose MRSA infection nor to guide or monitor treatment for MRSA infections.      Labs: Basic Metabolic Panel:  Recent Labs Lab 07/13/14 2015 07/13/14 2018 07/14/14 0621  NA 137 138 137  K 5.5* 5.4* 4.2  CL 104 107 101  CO2 20*  --  22  GLUCOSE 253* 252* 205*  BUN 39* 40* 24*  CREATININE 6.96*  7.10* 4.32*  CALCIUM 8.7*  --  8.5*   Liver Function Tests:  Recent Labs Lab 07/13/14 2015  AST 19  ALT 12*  ALKPHOS 89  BILITOT 0.6  PROT 8.5*  ALBUMIN 2.8*   CBC:  Recent Labs Lab 07/13/14 2015 07/13/14 2018 07/14/14 0621  WBC 13.5*  --  8.9  NEUTROABS 9.8*  --   --   HGB  10.5* 11.2* 9.0*  HCT 34.5* 33.0* 28.9*  MCV 86.0  --  84.8  PLT 280  --  242   CBG:  Recent Labs Lab 07/13/14 2022 07/14/14 0431 07/14/14 0741  GLUCAP 227* 187* 167*     IMAGING STUDIES Dg Chest 2 View  07/14/2014   CLINICAL DATA:  Cough.  EXAM: CHEST  2 VIEW  COMPARISON:  07/13/2014.  03/29/2014.  FINDINGS: The cardiopericardial silhouette is within normal limits. Mediastinal contours are normal. Small bilateral pleural effusions are present with blunting of the costophrenic angles evident on the lateral view. Interstitial opacity is present at the bases, likely representing interstitial pulmonary edema, particularly given the effusions. No focal consolidation. Chronic scarring is present in the LEFT lower lobe lateral to the LEFT heart border on the frontal view.  IMPRESSION: Basilar predominant interstitial opacities most compatible with interstitial pulmonary edema.   Electronically Signed   By: Dereck Ligas M.D.   On: 07/14/2014 10:12   Dg Chest Portable 1 View  07/13/2014   CLINICAL DATA:  Respiratory distress.  EXAM: PORTABLE CHEST - 1 VIEW  COMPARISON:  03/29/2014  FINDINGS: Numerous leads and wires project over the chest. The Chin overlies the apices. Midline trachea. Cardiomegaly accentuated by AP portable technique. No pleural effusion or pneumothorax. Lower lobe predominant, right worse than left interstitial and airspace disease is new.  IMPRESSION: New lower lobe predominant interstitial and airspace disease. Given absence of pleural effusions, favor multifocal infection or aspiration. Pulmonary edema could look similar.   Electronically Signed   By: Abigail Miyamoto M.D.   On: 07/13/2014 20:29    DISCHARGE EXAMINATION: Filed Vitals:   07/14/14 0330 07/14/14 0340 07/14/14 0430 07/14/14 0800  BP: 155/96 159/84 152/83 158/79  Pulse: 90 93 97 98  Temp:  97.7 F (36.5 C) 97.4 F (36.3 C)   TempSrc:  Oral Oral   Resp: 18 18 17 22   Height:   5\' 11"  (1.803 m)   Weight:  87.2  kg (192 lb 3.9 oz) 87.3 kg (192 lb 7.4 oz) 87.2 kg (192 lb 3.9 oz)  SpO2: 100% 100% 96% 98%   General appearance: alert, cooperative, appears stated age and no distress Resp: clear to auscultation bilaterally Cardio: regular rate and rhythm, S1, S2 normal, no murmur, click, rub or gallop GI: soft, non-tender; bowel sounds normal; no masses,  no organomegaly Neurologic: Alert and oriented X 3, normal strength and tone. Normal symmetric reflexes. Normal coordination and gait  DISPOSITION: Home with wife  Discharge Instructions    Call MD for:  extreme fatigue    Complete by:  As directed      Call MD for:  persistant dizziness or light-headedness    Complete by:  As directed      Call MD for:  persistant nausea and vomiting    Complete by:  As directed      Call MD for:  severe uncontrolled pain    Complete by:  As directed      Call MD for:  temperature >100.4    Complete by:  As directed  Diet Carb Modified    Complete by:  As directed      Discharge instructions    Complete by:  As directed   Please resume your dialysis schedule starting tomorrow.  You were cared for by a hospitalist during your hospital stay. If you have any questions about your discharge medications or the care you received while you were in the hospital after you are discharged, you can call the unit and asked to speak with the hospitalist on call if the hospitalist that took care of you is not available. Once you are discharged, your primary care physician will handle any further medical issues. Please note that NO REFILLS for any discharge medications will be authorized once you are discharged, as it is imperative that you return to your primary care physician (or establish a relationship with a primary care physician if you do not have one) for your aftercare needs so that they can reassess your need for medications and monitor your lab values. If you do not have a primary care physician, you can call [phone]  for a physician referral.     Increase activity slowly    Complete by:  As directed            ALLERGIES:  Allergies  Allergen Reactions  . Morphine And Related Other (See Comments)    hallucinations  . Tygacil [Tigecycline] Other (See Comments)    Makes him feel crazy     Discharge Medication List as of 07/14/2014 11:32 AM    CONTINUE these medications which have NOT CHANGED   Details  amLODipine (NORVASC) 5 MG tablet Take 5 mg by mouth daily. , Starting 02/22/2014, Until Discontinued, Historical Med    Cholecalciferol (VITAMIN D PO) Take 1 tablet by mouth every Monday, Wednesday, and Friday. Take at dialysis, Until Discontinued, Historical Med    gabapentin (NEURONTIN) 100 MG capsule Take 400 mg by mouth at bedtime. , Starting 07/12/2014, Until Discontinued, Historical Med    glipiZIDE (GLUCOTROL) 5 MG tablet Take 1 tablet (5 mg total) by mouth daily before breakfast., Starting 10/24/2013, Until Discontinued, Normal    HYDROcodone-acetaminophen (NORCO/VICODIN) 5-325 MG per tablet Take 1 tablet by mouth every 4 (four) hours as needed for moderate pain. , Starting 04/15/2014, Until Discontinued, Historical Med    ibuprofen (ADVIL,MOTRIN) 200 MG tablet Take 200 mg by mouth daily as needed (pain)., Until Discontinued, Historical Med    oxyCODONE-acetaminophen (PERCOCET/ROXICET) 5-325 MG per tablet Take 1-2 tablets by mouth every 4 (four) hours as needed for severe pain. , Starting 06/27/2014, Until Discontinued, Historical Med    zolpidem (AMBIEN) 10 MG tablet Take 1 tablet (10 mg total) by mouth at bedtime as needed for sleep., Starting 12/28/2013, Until Discontinued, Print    amitriptyline (ELAVIL) 50 MG tablet Take 1 tablet (50 mg total) by mouth at bedtime., Starting 03/28/2013, Until Discontinued, Normal    oseltamivir (TAMIFLU) 30 MG capsule Take 1 capsule (30 mg total) by mouth daily. Only take medication AFTER dialysis x 5 doses, Starting 07/12/2014, Until Discontinued, Normal      sildenafil (VIAGRA) 25 MG tablet Take 2 tablets (50 mg total) by mouth daily as needed for erectile dysfunction., Starting 06/27/2013, Until Discontinued, Normal       Follow-up Information    Follow up with Gildardo Cranker, DO. Schedule an appointment as soon as possible for a visit in 1 week.   Specialty:  Internal Medicine   Why:  post hospitalization follow up   Contact information:  [address] [place] [address] [phone]       TOTAL DISCHARGE TIME: 35 minutes  [facility] Pager [phone]  07/14/2014, 1:27 PM

## 2014-07-14 NOTE — ED Notes (Signed)
Report called to [employer], RN 3S

## 2014-07-14 NOTE — ED Notes (Signed)
Ativan 1 mg wasted after patient was removed from the floor  Unable to waste in pyxis

## 2014-07-14 NOTE — Progress Notes (Signed)
Utilization Review Completed.Kohl Polinsky T7/03/2014  

## 2014-07-16 LAB — HEMOGLOBIN A1C
HEMOGLOBIN A1C: 5.5 % (ref 4.8–5.6)
MEAN PLASMA GLUCOSE: 111 mg/dL

## 2014-07-18 LAB — CULTURE, BLOOD (ROUTINE X 2): Culture: NO GROWTH

## 2014-07-19 ENCOUNTER — Telehealth: Payer: Self-pay | Admitting: *Deleted

## 2014-07-19 LAB — CULTURE, BLOOD (ROUTINE X 2): CULTURE: NO GROWTH

## 2014-07-19 NOTE — Telephone Encounter (Signed)
Called regarding having a lot of clear mucus draining from his nose and unable to sleep at night because he is coughing a lot. He states that he can't lie down for all the coughing, so he is exhausted. I informed him that he could go to the emergency room before he goes to dialysis this afternoon but he should not miss his dialysis appointment. He stated that he would be at his appointment.

## 2014-07-22 LAB — AFB CULTURE WITH SMEAR (NOT AT ARMC): Acid Fast Smear: NONE SEEN

## 2014-07-24 ENCOUNTER — Encounter: Payer: Self-pay | Admitting: Internal Medicine

## 2014-07-24 ENCOUNTER — Ambulatory Visit (INDEPENDENT_AMBULATORY_CARE_PROVIDER_SITE_OTHER): Payer: Commercial Managed Care - HMO | Admitting: Internal Medicine

## 2014-07-24 VITALS — BP 130/78 | HR 100 | Temp 98.2°F | Resp 20 | Ht 71.0 in | Wt 198.8 lb

## 2014-07-24 DIAGNOSIS — R000 Tachycardia, unspecified: Secondary | ICD-10-CM | POA: Diagnosis not present

## 2014-07-24 DIAGNOSIS — J069 Acute upper respiratory infection, unspecified: Secondary | ICD-10-CM | POA: Diagnosis not present

## 2014-07-24 DIAGNOSIS — N186 End stage renal disease: Secondary | ICD-10-CM

## 2014-07-24 DIAGNOSIS — G4700 Insomnia, unspecified: Secondary | ICD-10-CM

## 2014-07-24 DIAGNOSIS — I10 Essential (primary) hypertension: Secondary | ICD-10-CM | POA: Diagnosis not present

## 2014-07-24 NOTE — Patient Instructions (Signed)
Ok to start Spin class at gym  Follow up with outpatient PT as scheduled  Follow up with Ortho as scheduled  Recommend smoking cessation  Keep appt in Sept as scheduled

## 2014-07-24 NOTE — Progress Notes (Signed)
[name], male   DOB: Dec 14, 1960, 54 y.o.   MRN: [id number]    Location:    [facility]   Place of Service:   OFFICE  Chief Complaint  Patient presents with  . Hospitalization Follow-up    HPI:  54 yo male seen today for hospital f/u. He was d/c'd with acute CHF, hyperkalemia, acute respiratory failure with hypoxemia, sepsis, ESRD/HD. He was dialyzed emergently. CXR revealed interstitial pulmonary edema. K 5.5 on admission and improved to 4.2 at d/c.  He never took Tamiflu for URI. He is still smoking cigs. He goes to HD as scheduled. No SOB, CP, cough, weakness. No f/c. No N/V. BS low 100s. No low BS reactions. Last A1c 5.9% in the last month. He would like to start spin class at the gym. He has gotten approval from Ortho to do so. He will begin o/p PT today  Past Medical History  Diagnosis Date  . Polymyalgia rheumatica   . Hypertension   . Diabetic neuropathy   . Osteomyelitis of foot, left, acute   . Anxiety   . Insomnia, unspecified   . Unspecified vitamin D deficiency   . Anemia, unspecified   . Other chronic postoperative pain   . Unspecified hereditary and idiopathic peripheral neuropathy   . Allergy   . Unspecified osteomyelitis, site unspecified   . Long term (current) use of anticoagulants   . 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 Ulcer     diabetic foot   . Arthralgia 2010    polyarticular  . Hemorrhoids, internal 10/2011    small  . CHF (congestive heart failure)   . CHF (congestive heart failure) 07/25/2009    denies  . Hemodialysis access site with mature fistula   . Myocardial infarction 1995  . Pneumonia     "probably 4-5 times" (05/09/2014)  . Sleep apnea     "lost weight; no more problem" (05/09/2014)  . Type II diabetes mellitus dx'd 1995  . History of blood transfusion     "related to the anemia"  . GERD (gastroesophageal reflux disease)     hx "before I lost weight"  . ESRD (end stage renal disease) on dialysis    started 12/2012; "MWF; [facility]"  . Stroke 01/10/06    denies residual on 05/09/2014  . Arthritis     "back, knees" (05/09/2014)  . Chronic lower back pain     Past Surgical History  Procedure Laterality Date  . Amputation  01/21/2012    Procedure: AMPUTATION RAY;  Surgeon: [name], MD;  Location: [place];  Service: Orthopedics;  Laterality: Left;  Left Foot 4th Ray Amputation  . Anterior cervical decomp/discectomy fusion  02/2011  . Knee arthroscopy Left 08-25-2012  . Toe amputation Bilateral     "I've lost 7 toes over the last 7 years" (05/09/2014)  . Refractive surgery Bilateral   . Bascilic vein transposition Left 10/19/2012    Procedure: BASCILIC VEIN TRANSPOSITION;  Surgeon: Serafina Mitchell, MD;  Location: [place];  Service: Vascular;  Laterality: Left;  . Tonsillectomy    . Amputation Left 05/04/2013    Procedure: AMPUTATION DIGIT;  Surgeon: [name], MD;  Location: [place];  Service: Orthopedics;  Laterality: Left;  Left Great Toe Amputation at [facility]  . Toe surgery Left April 2015    Big toe removed on left foot.  . Total knee arthroplasty Left 04/10/2014    Procedure: TOTAL KNEE ARTHROPLASTY;  Surgeon: Beverely Low  Fernanda Drum, MD;  Location: [place];  Service: Orthopedics;  Laterality: Left;  . Wound debridement Left 05/09/2014    Dehiscence Left Total Knee Arthroplasty Incision  . Back surgery    . Uvulopalatopharyngoplasty, tonsillectomy and septoplasty  ~ 1989  . I&d extremity Left 05/09/2014    Procedure: Irrigation and Debridement Left Knee and Closure of Total Knee Arthroplasty Incision;  Surgeon: [name], MD;  Location: [place];  Service: Orthopedics;  Laterality: Left;  . I&d knee with poly exchange Left 05/31/2014    Procedure: IRRIGATION AND DEBRIDEMENT LEFT KNEE, PLACE ANTIBIOTIC BEADS,  POLY EXCHANGE;  Surgeon: [name], MD;  Location: [place];  Service: Orthopedics;  Laterality: Left;    Patient Care Team: Gildardo Cranker, DO as PCP - General (Internal  Medicine)  History   Social History  . Marital Status: Married    Spouse Name: N/A  . Number of Children: N/A  . Years of Education: N/A   Occupational History  . Not on file.   Social History Main Topics  . Smoking status: Current Some Day Smoker -- 0.12 packs/day for 32 years    Types: Cigarettes  . Smokeless tobacco: Never Used     Comment: 3 -4 cig daily   . Alcohol Use: No  . Drug Use: No  . Sexual Activity: Yes   Other Topics Concern  . Not on file   Social History Narrative     reports that he has been smoking Cigarettes.  He has a 3.84 pack-year smoking history. He has never used smokeless tobacco. He reports that he does not drink alcohol or use illicit drugs.  Allergies  Allergen Reactions  . Morphine And Related Other (See Comments)    hallucinations  . Tygacil [Tigecycline] Other (See Comments)    Makes him feel crazy    Medications: Patient's Medications  New Prescriptions   No medications on file  Previous Medications   AMITRIPTYLINE (ELAVIL) 50 MG TABLET    Take 1 tablet (50 mg total) by mouth at bedtime.   AMLODIPINE (NORVASC) 5 MG TABLET    Take 5 mg by mouth daily.    CHOLECALCIFEROL (VITAMIN D PO)    Take 1 tablet by mouth every Monday, Wednesday, and Friday. Take at dialysis   GABAPENTIN (NEURONTIN) 100 MG CAPSULE    Take 400 mg by mouth at bedtime.    GLIPIZIDE (GLUCOTROL) 5 MG TABLET    Take 1 tablet (5 mg total) by mouth daily before breakfast.   HYDROCODONE-ACETAMINOPHEN (NORCO/VICODIN) 5-325 MG PER TABLET    Take 1 tablet by mouth every 4 (four) hours as needed for moderate pain.    IBUPROFEN (ADVIL,MOTRIN) 200 MG TABLET    Take 200 mg by mouth daily as needed (pain).   OSELTAMIVIR (TAMIFLU) 30 MG CAPSULE    Take 1 capsule (30 mg total) by mouth daily. Only take medication AFTER dialysis x 5 doses   OXYCODONE-ACETAMINOPHEN (PERCOCET/ROXICET) 5-325 MG PER TABLET    Take 1-2 tablets by mouth every 4 (four) hours as needed for severe pain.     SILDENAFIL (VIAGRA) 25 MG TABLET    Take 2 tablets (50 mg total) by mouth daily as needed for erectile dysfunction.   ZOLPIDEM (AMBIEN) 10 MG TABLET    Take 1 tablet (10 mg total) by mouth at bedtime as needed for sleep.  Modified Medications   No medications on file  Discontinued Medications   No medications on file    Review of  Systems  Constitutional: Negative for chills, activity change and fatigue.  HENT: Negative for sore throat and trouble swallowing.   Eyes: Negative for visual disturbance.  Respiratory: Negative for cough, chest tightness and shortness of breath.   Cardiovascular: Negative for chest pain, palpitations and leg swelling.  Gastrointestinal: Negative for nausea, vomiting, abdominal pain and blood in stool.  Genitourinary: Negative for urgency, frequency and difficulty urinating.  Musculoskeletal: Negative for arthralgias and gait problem.  Skin: Negative for rash.  Neurological: Negative for weakness and headaches.  Psychiatric/Behavioral: Positive for sleep disturbance (despite being on ambien. he wakes up at 3 AM and cannot go back to sleep. he does nocturnal HD on MWF). Negative for confusion. The patient is not nervous/anxious.     Filed Vitals:   07/24/14 0854  BP: 130/78  Pulse: 100  Temp: 98.2 F (36.8 C)  TempSrc: Oral  Resp: 20  Height: _0  (1.803 m)  Weight: 198 lb 12.8 oz (90.175 kg)  SpO2: 97%   Body mass index is 27.74 kg/(m^2).  Physical Exam  Constitutional: He is oriented to person, place, and time. He appears well-developed and well-nourished.  HENT:  Mouth/Throat: Oropharynx is clear and moist.  Eyes: Pupils are equal, round, and reactive to light. No scleral icterus.  Neck: Neck supple. Carotid bruit is not present. No thyromegaly present.  Cardiovascular: Regular rhythm, normal heart sounds and intact distal pulses.  Tachycardia present.  Exam reveals no gallop and no friction rub.   No murmur heard. no distal LE swelling. No  calf TTP  Pulmonary/Chest: Effort normal and breath sounds normal. He has no wheezes. He has no rales. He exhibits no tenderness.  Abdominal: Soft. Bowel sounds are normal. He exhibits no distension, no abdominal bruit, no pulsatile midline mass and no mass. There is no tenderness. There is no rebound and no guarding.  Musculoskeletal: He exhibits edema (left knee) and tenderness.  Lymphadenopathy:    He has no cervical adenopathy.  Neurological: He is alert and oriented to person, place, and time. He has normal reflexes.  Skin: Skin is warm and dry. No rash noted.  Psychiatric: He has a normal mood and affect. His behavior is normal. Judgment and thought content normal.     Labs reviewed: Admission on 07/13/2014, Discharged on 07/14/2014  Component Date Value Ref Range Status  . WBC 07/13/2014 13.5* 4.0 - 10.5 K/uL Final  . RBC 07/13/2014 4.01* 4.22 - 5.81 MIL/uL Final  . Hemoglobin 07/13/2014 10.5* 13.0 - 17.0 g/dL Final  . HCT 07/13/2014 34.5* 39.0 - 52.0 % Final  . MCV 07/13/2014 86.0  78.0 - 100.0 fL Final  . MCH 07/13/2014 26.2  26.0 - 34.0 pg Final  . MCHC 07/13/2014 30.4  30.0 - 36.0 g/dL Final  . RDW 07/13/2014 17.4* 11.5 - 15.5 % Final  . Platelets 07/13/2014 280  150 - 400 K/uL Final  . Neutrophils Relative % 07/13/2014 73  43 - 77 % Final  . Neutro Abs 07/13/2014 9.8* 1.7 - 7.7 K/uL Final  . Lymphocytes Relative 07/13/2014 19  12 - 46 % Final  . Lymphs Abs 07/13/2014 2.6  0.7 - 4.0 K/uL Final  . Monocytes Relative 07/13/2014 7  3 - 12 % Final  . Monocytes Absolute 07/13/2014 0.9  0.1 - 1.0 K/uL Final  . Eosinophils Relative 07/13/2014 1  0 - 5 % Final  . Eosinophils Absolute 07/13/2014 0.1  0.0 - 0.7 K/uL Final  . Basophils Relative 07/13/2014 0  0 - 1 %  Final  . Basophils Absolute 07/13/2014 0.0  0.0 - 0.1 K/uL Final  . Sodium 07/13/2014 137  135 - 145 mmol/L Final  . Potassium 07/13/2014 5.5* 3.5 - 5.1 mmol/L Final  . Chloride 07/13/2014 104  101 - 111 mmol/L Final   . CO2 07/13/2014 20* 22 - 32 mmol/L Final  . Glucose, Bld 07/13/2014 253* 65 - 99 mg/dL Final  . BUN 07/13/2014 39* 6 - 20 mg/dL Final  . Creatinine, Ser 07/13/2014 6.96* 0.61 - 1.24 mg/dL Final  . Calcium 07/13/2014 8.7* 8.9 - 10.3 mg/dL Final  . Total Protein 07/13/2014 8.5* 6.5 - 8.1 g/dL Final  . Albumin 07/13/2014 2.8* 3.5 - 5.0 g/dL Final  . AST 07/13/2014 19  15 - 41 U/L Final  . ALT 07/13/2014 12* 17 - 63 U/L Final  . Alkaline Phosphatase 07/13/2014 89  38 - 126 U/L Final  . Total Bilirubin 07/13/2014 0.6  0.3 - 1.2 mg/dL Final  . GFR calc non Af Amer 07/13/2014 8* >60 mL/min Final  . GFR calc Af Amer 07/13/2014 9* >60 mL/min Final   Comment: (NOTE) The eGFR has been calculated using the CKD EPI equation. This calculation has not been validated in all clinical situations. eGFR's persistently <60 mL/min signify possible Chronic Kidney Disease.   . Anion gap 07/13/2014 13  5 - 15 Final  . Lactic Acid, Venous 07/13/2014 2.26* 0.5 - 2.0 mmol/L Final  . Comment 07/13/2014 NOTIFIED PHYSICIAN   Final  . Troponin i, poc 07/13/2014 0.02  0.00 - 0.08 ng/mL Final  . Comment 3 07/13/2014          Final   Comment: Due to the release kinetics of cTnI, a negative result within the first hours of the onset of symptoms does not rule out myocardial infarction with certainty. If myocardial infarction is still suspected, repeat the test at appropriate intervals.   . Glucose-Capillary 07/13/2014 227* 65 - 99 mg/dL Final  . Sodium 07/13/2014 138  135 - 145 mmol/L Final  . Potassium 07/13/2014 5.4* 3.5 - 5.1 mmol/L Final  . Chloride 07/13/2014 107  101 - 111 mmol/L Final  . BUN 07/13/2014 40* 6 - 20 mg/dL Final  . Creatinine, Ser 07/13/2014 7.10* 0.61 - 1.24 mg/dL Final  . Glucose, Bld 07/13/2014 252* 65 - 99 mg/dL Final  . Calcium, Ion 07/13/2014 1.11* 1.12 - 1.23 mmol/L Final  . TCO2 07/13/2014 20  0 - 100 mmol/L Final  . Hemoglobin 07/13/2014 11.2* 13.0 - 17.0 g/dL Final  . HCT  07/13/2014 33.0* 39.0 - 52.0 % Final  . pH, Arterial 07/13/2014 7.376  7.350 - 7.450 Final  . pCO2 arterial 07/13/2014 33.5* 35.0 - 45.0 mmHg Final  . pO2, Arterial 07/13/2014 72.0* 80.0 - 100.0 mmHg Final  . Bicarbonate 07/13/2014 19.6* 20.0 - 24.0 mEq/L Final  . TCO2 07/13/2014 21  0 - 100 mmol/L Final  . O2 Saturation 07/13/2014 94.0   Final  . Acid-base deficit 07/13/2014 5.0* 0.0 - 2.0 mmol/L Final  . Patient temperature 07/13/2014 98.6 F   Final  . Collection site 07/13/2014 BRACHIAL ARTERY   Final  . Drawn by 07/13/2014 RT   Final  . Sample type 07/13/2014 ARTERIAL   Final  . Specimen Description 07/13/2014 BLOOD RIGHT ARM   Final  . Special Requests 07/13/2014 BOTTLES DRAWN AEROBIC AND ANAEROBIC 5CC EA   Final  . Culture 07/13/2014 NO GROWTH 5 DAYS   Final  . Report Status 07/13/2014 07/18/2014 FINAL   Final  .  Specimen Description 07/13/2014 BLOOD RIGHT HAND   Final  . Special Requests 07/13/2014 BOTTLES DRAWN AEROBIC ONLY 10CC   Final  . Culture 07/13/2014 NO GROWTH 5 DAYS   Final  . Report Status 07/13/2014 07/19/2014 FINAL   Final  . Lactic Acid, Venous 07/13/2014 1.43  0.5 - 2.0 mmol/L Final  . Hgb A1c MFr Bld 07/14/2014 5.5  4.8 - 5.6 % Final   Comment: (NOTE)         Pre-diabetes: 5.7 - 6.4         Diabetes: >6.4         Glycemic control for adults with diabetes: <7.0   . Mean Plasma Glucose 07/14/2014 111   Final   Comment: (NOTE) Performed At: [facility] [place], [place] [id number] Lindon Romp MD [id number]   . Sodium 07/14/2014 137  135 - 145 mmol/L Final  . Potassium 07/14/2014 4.2  3.5 - 5.1 mmol/L Final  . Chloride 07/14/2014 101  101 - 111 mmol/L Final  . CO2 07/14/2014 22  22 - 32 mmol/L Final  . Glucose, Bld 07/14/2014 205* 65 - 99 mg/dL Final  . BUN 07/14/2014 24* 6 - 20 mg/dL Final  . Creatinine, Ser 07/14/2014 4.32* 0.61 - 1.24 mg/dL Final   DELTA CHECK NOTED  . Calcium 07/14/2014 8.5* 8.9 - 10.3 mg/dL Final  . GFR  calc non Af Amer 07/14/2014 14* >60 mL/min Final  . GFR calc Af Amer 07/14/2014 16* >60 mL/min Final   Comment: (NOTE) The eGFR has been calculated using the CKD EPI equation. This calculation has not been validated in all clinical situations. eGFR's persistently <60 mL/min signify possible Chronic Kidney Disease.   . Anion gap 07/14/2014 14  5 - 15 Final  . WBC 07/14/2014 8.9  4.0 - 10.5 K/uL Final  . RBC 07/14/2014 3.41* 4.22 - 5.81 MIL/uL Final  . Hemoglobin 07/14/2014 9.0* 13.0 - 17.0 g/dL Final  . HCT 07/14/2014 28.9* 39.0 - 52.0 % Final  . MCV 07/14/2014 84.8  78.0 - 100.0 fL Final  . MCH 07/14/2014 26.4  26.0 - 34.0 pg Final  . MCHC 07/14/2014 31.1  30.0 - 36.0 g/dL Final  . RDW 07/14/2014 17.4* 11.5 - 15.5 % Final  . Platelets 07/14/2014 242  150 - 400 K/uL Final  . MRSA by PCR 07/14/2014 NEGATIVE  NEGATIVE Final   Comment:        The GeneXpert MRSA Assay (FDA approved for NASAL specimens only), is one component of a comprehensive MRSA colonization surveillance program. It is not intended to diagnose MRSA infection nor to guide or monitor treatment for MRSA infections.   . Glucose-Capillary 07/14/2014 187* 65 - 99 mg/dL Final  . Glucose-Capillary 07/14/2014 167* 65 - 99 mg/dL Final  Admission on 05/31/2014, Discharged on 06/08/2014  No results displayed because visit has over 200 results.    Admission on 05/09/2014, Discharged on 05/10/2014  Component Date Value Ref Range Status  . WBC 05/09/2014 6.9  4.0 - 10.5 K/uL Final  . RBC 05/09/2014 3.78* 4.22 - 5.81 MIL/uL Final  . Hemoglobin 05/09/2014 10.6* 13.0 - 17.0 g/dL Final  . HCT 05/09/2014 34.1* 39.0 - 52.0 % Final  . MCV 05/09/2014 90.2  78.0 - 100.0 fL Final  . MCH 05/09/2014 28.0  26.0 - 34.0 pg Final  . MCHC 05/09/2014 31.1  30.0 - 36.0 g/dL Final  . RDW 05/09/2014 14.9  11.5 - 15.5 % Final  . Platelets 05/09/2014 248  150 - 400 K/uL Final  . Neutrophils Relative % 05/09/2014 51  43 - 77 % Final  . Neutro  Abs 05/09/2014 3.6  1.7 - 7.7 K/uL Final  . Lymphocytes Relative 05/09/2014 31  12 - 46 % Final  . Lymphs Abs 05/09/2014 2.2  0.7 - 4.0 K/uL Final  . Monocytes Relative 05/09/2014 14* 3 - 12 % Final  . Monocytes Absolute 05/09/2014 1.0  0.1 - 1.0 K/uL Final  . Eosinophils Relative 05/09/2014 3  0 - 5 % Final  . Eosinophils Absolute 05/09/2014 0.2  0.0 - 0.7 K/uL Final  . Basophils Relative 05/09/2014 1  0 - 1 % Final  . Basophils Absolute 05/09/2014 0.1  0.0 - 0.1 K/uL Final  . Sodium 05/09/2014 137  135 - 145 mmol/L Final  . Potassium 05/09/2014 4.8  3.5 - 5.1 mmol/L Final  . Chloride 05/09/2014 100  96 - 112 mmol/L Final  . BUN 05/09/2014 11  6 - 23 mg/dL Final  . Creatinine, Ser 05/09/2014 3.10* 0.50 - 1.35 mg/dL Final  . Glucose, Bld 05/09/2014 121* 70 - 99 mg/dL Final  . Calcium, Ion 05/09/2014 1.13  1.12 - 1.23 mmol/L Final  . TCO2 05/09/2014 24  0 - 100 mmol/L Final  . Hemoglobin 05/09/2014 11.2* 13.0 - 17.0 g/dL Final  . HCT 05/09/2014 33.0* 39.0 - 52.0 % Final  . Glucose-Capillary 05/09/2014 104* 70 - 99 mg/dL Final  . Comment 1 05/09/2014 Notify RN   Final  . Comment 2 05/09/2014 Document in Chart   Final  . Glucose-Capillary 05/10/2014 97  70 - 99 mg/dL Final  . Comment 1 05/10/2014 Notify RN   Final  . Glucose-Capillary 05/10/2014 126* 70 - 99 mg/dL Final  . Comment 1 05/10/2014 Notify RN   Final    Dg Chest 2 View  07/14/2014   CLINICAL DATA:  Cough.  EXAM: CHEST  2 VIEW  COMPARISON:  07/13/2014.  03/29/2014.  FINDINGS: The cardiopericardial silhouette is within normal limits. Mediastinal contours are normal. Small bilateral pleural effusions are present with blunting of the costophrenic angles evident on the lateral view. Interstitial opacity is present at the bases, likely representing interstitial pulmonary edema, particularly given the effusions. No focal consolidation. Chronic scarring is present in the LEFT lower lobe lateral to the LEFT heart border on the frontal  view.  IMPRESSION: Basilar predominant interstitial opacities most compatible with interstitial pulmonary edema.   Electronically Signed   By: Dereck Ligas M.D.   On: 07/14/2014 10:12   Dg Chest Portable 1 View  07/13/2014   CLINICAL DATA:  Respiratory distress.  EXAM: PORTABLE CHEST - 1 VIEW  COMPARISON:  03/29/2014  FINDINGS: Numerous leads and wires project over the chest. The Chin overlies the apices. Midline trachea. Cardiomegaly accentuated by AP portable technique. No pleural effusion or pneumothorax. Lower lobe predominant, right worse than left interstitial and airspace disease is new.  IMPRESSION: New lower lobe predominant interstitial and airspace disease. Given absence of pleural effusions, favor multifocal infection or aspiration. Pulmonary edema could look similar.   Electronically Signed   By: Abigail Miyamoto M.D.   On: 07/13/2014 20:29     Assessment/Plan   ICD-9-CM ICD-10-CM   1. End stage renal disease on nocturnal HD MWF 585.6 N18.6   2. Tachycardia - stable 785.0 R00.0   3. Essential hypertension, benign - stable 401.1 I10   4. Acute upper respiratory infection - resolved 465.9 J06.9   5. Insomnia - on ambien 780.52 G47.00     --  medically stable to begin Spin Class at local [employer]  --follow up with o/p PT as scheduled  --f/u with Ortho as scheduled  --sleeping disturbance probably due to HD. Cont ambien as rx  --smoking cessation discussed and highly urged. He is not interested in stopping at this time  --cont current meds as ordered  --keep nocturnal appts with HD as scheduled MWF  --keep appt as scheduled  Shavette Shoaff S. Perlie Gold  [facility] [address] [place], [place] [address] [phone] Cell (Monday-Friday 8 AM - 5 PM) [phone] After 5 PM and follow prompts

## 2014-08-06 ENCOUNTER — Other Ambulatory Visit (HOSPITAL_COMMUNITY): Payer: Self-pay | Admitting: Orthopedic Surgery

## 2014-08-06 NOTE — Progress Notes (Signed)
Pt's last EKG done 07/23/14 showed Sinus tach at a rate of 153. Showed EKG to Dr. Tobias Alexander and he ordered an EKG be done day of surgery. Order entered. Pt states he has no chest pain or sob.

## 2014-08-07 ENCOUNTER — Inpatient Hospital Stay (HOSPITAL_COMMUNITY)
Admission: RE | Admit: 2014-08-07 | Discharge: 2014-08-12 | DRG: 463 | Disposition: A | Discharge status: Home Health | Payer: Commercial Managed Care - HMO | Source: Ambulatory Visit | Attending: Orthopedic Surgery | Admitting: Orthopedic Surgery

## 2014-08-07 ENCOUNTER — Inpatient Hospital Stay (HOSPITAL_COMMUNITY): Payer: Commercial Managed Care - HMO | Admitting: Anesthesiology

## 2014-08-07 ENCOUNTER — Encounter (HOSPITAL_COMMUNITY)
Admission: RE | Disposition: A | Discharge status: Home Health | Payer: Self-pay | Source: Ambulatory Visit | Attending: Orthopedic Surgery

## 2014-08-07 ENCOUNTER — Encounter (HOSPITAL_COMMUNITY): Payer: Self-pay | Admitting: Anesthesiology

## 2014-08-07 ENCOUNTER — Inpatient Hospital Stay (HOSPITAL_COMMUNITY): Payer: Commercial Managed Care - HMO

## 2014-08-07 DIAGNOSIS — I120 Hypertensive chronic kidney disease with stage 5 chronic kidney disease or end stage renal disease: Secondary | ICD-10-CM | POA: Diagnosis present

## 2014-08-07 DIAGNOSIS — Y831 Surgical operation with implant of artificial internal device as the cause of abnormal reaction of the patient, or of later complication, without mention of misadventure at the time of the procedure: Secondary | ICD-10-CM | POA: Diagnosis present

## 2014-08-07 DIAGNOSIS — D631 Anemia in chronic kidney disease: Secondary | ICD-10-CM | POA: Diagnosis present

## 2014-08-07 DIAGNOSIS — T40605A Adverse effect of unspecified narcotics, initial encounter: Secondary | ICD-10-CM | POA: Diagnosis not present

## 2014-08-07 DIAGNOSIS — Z8673 Personal history of transient ischemic attack (TIA), and cerebral infarction without residual deficits: Secondary | ICD-10-CM | POA: Diagnosis not present

## 2014-08-07 DIAGNOSIS — B9689 Other specified bacterial agents as the cause of diseases classified elsewhere: Secondary | ICD-10-CM | POA: Diagnosis not present

## 2014-08-07 DIAGNOSIS — I252 Old myocardial infarction: Secondary | ICD-10-CM | POA: Diagnosis not present

## 2014-08-07 DIAGNOSIS — N186 End stage renal disease: Secondary | ICD-10-CM | POA: Diagnosis present

## 2014-08-07 DIAGNOSIS — E1159 Type 2 diabetes mellitus with other circulatory complications: Secondary | ICD-10-CM | POA: Diagnosis present

## 2014-08-07 DIAGNOSIS — M353 Polymyalgia rheumatica: Secondary | ICD-10-CM | POA: Diagnosis present

## 2014-08-07 DIAGNOSIS — M00862 Arthritis due to other bacteria, left knee: Secondary | ICD-10-CM | POA: Diagnosis present

## 2014-08-07 DIAGNOSIS — E559 Vitamin D deficiency, unspecified: Secondary | ICD-10-CM | POA: Diagnosis present

## 2014-08-07 DIAGNOSIS — E785 Hyperlipidemia, unspecified: Secondary | ICD-10-CM | POA: Diagnosis present

## 2014-08-07 DIAGNOSIS — F17210 Nicotine dependence, cigarettes, uncomplicated: Secondary | ICD-10-CM | POA: Diagnosis present

## 2014-08-07 DIAGNOSIS — M179 Osteoarthritis of knee, unspecified: Secondary | ICD-10-CM | POA: Diagnosis present

## 2014-08-07 DIAGNOSIS — K5909 Other constipation: Secondary | ICD-10-CM | POA: Diagnosis not present

## 2014-08-07 DIAGNOSIS — T8454XA Infection and inflammatory reaction due to internal left knee prosthesis, initial encounter: Secondary | ICD-10-CM | POA: Diagnosis present

## 2014-08-07 DIAGNOSIS — R339 Retention of urine, unspecified: Secondary | ICD-10-CM | POA: Diagnosis not present

## 2014-08-07 DIAGNOSIS — Z89422 Acquired absence of other left toe(s): Secondary | ICD-10-CM | POA: Diagnosis not present

## 2014-08-07 DIAGNOSIS — Z992 Dependence on renal dialysis: Secondary | ICD-10-CM | POA: Diagnosis not present

## 2014-08-07 DIAGNOSIS — Z89412 Acquired absence of left great toe: Secondary | ICD-10-CM

## 2014-08-07 DIAGNOSIS — M009 Pyogenic arthritis, unspecified: Secondary | ICD-10-CM | POA: Diagnosis present

## 2014-08-07 DIAGNOSIS — E1122 Type 2 diabetes mellitus with diabetic chronic kidney disease: Secondary | ICD-10-CM | POA: Diagnosis present

## 2014-08-07 DIAGNOSIS — R509 Fever, unspecified: Secondary | ICD-10-CM | POA: Diagnosis not present

## 2014-08-07 DIAGNOSIS — Z96652 Presence of left artificial knee joint: Secondary | ICD-10-CM

## 2014-08-07 DIAGNOSIS — R000 Tachycardia, unspecified: Secondary | ICD-10-CM | POA: Diagnosis not present

## 2014-08-07 HISTORY — PX: EXCISIONAL TOTAL KNEE ARTHROPLASTY WITH ANTIBIOTIC SPACERS: SHX5827

## 2014-08-07 LAB — TYPE AND SCREEN
ABO/RH(D): O NEG
Antibody Screen: NEGATIVE

## 2014-08-07 LAB — COMPREHENSIVE METABOLIC PANEL
ALK PHOS: 71 U/L (ref 38–126)
ALT: 12 U/L — ABNORMAL LOW (ref 17–63)
AST: 15 U/L (ref 15–41)
Albumin: 3.1 g/dL — ABNORMAL LOW (ref 3.5–5.0)
Anion gap: 12 (ref 5–15)
BUN: 42 mg/dL — AB (ref 6–20)
CO2: 22 mmol/L (ref 22–32)
Calcium: 9 mg/dL (ref 8.9–10.3)
Chloride: 102 mmol/L (ref 101–111)
Creatinine, Ser: 6.63 mg/dL — ABNORMAL HIGH (ref 0.61–1.24)
GFR calc non Af Amer: 8 mL/min — ABNORMAL LOW (ref >60)
GFR, EST AFRICAN AMERICAN: 10 mL/min — AB (ref >60)
Glucose, Bld: 69 mg/dL (ref 65–99)
Potassium: 5.2 mmol/L — ABNORMAL HIGH (ref 3.5–5.1)
Sodium: 136 mmol/L (ref 135–145)
TOTAL PROTEIN: 7.6 g/dL (ref 6.5–8.1)
Total Bilirubin: 0.9 mg/dL (ref 0.3–1.2)

## 2014-08-07 LAB — SEDIMENTATION RATE: Sed Rate: 75 mm/hr — ABNORMAL HIGH (ref 0–16)

## 2014-08-07 LAB — GLUCOSE, CAPILLARY
GLUCOSE-CAPILLARY: 87 mg/dL (ref 65–99)
Glucose-Capillary: 73 mg/dL (ref 65–99)
Glucose-Capillary: 113 mg/dL — ABNORMAL HIGH (ref 65–99)

## 2014-08-07 LAB — CBC
HEMATOCRIT: 34.8 % — AB (ref 39.0–52.0)
HEMOGLOBIN: 10.6 g/dL — AB (ref 13.0–17.0)
MCH: 26.6 pg (ref 26.0–34.0)
MCHC: 30.5 g/dL (ref 30.0–36.0)
MCV: 87.4 fL (ref 78.0–100.0)
Platelets: 274 10*3/uL (ref 150–400)
RBC: 3.98 MIL/uL — ABNORMAL LOW (ref 4.22–5.81)
RDW: 19.4 % — ABNORMAL HIGH (ref 11.5–15.5)
WBC: 8.7 10*3/uL (ref 4.0–10.5)

## 2014-08-07 LAB — C-REACTIVE PROTEIN: CRP: 5.6 mg/dL — ABNORMAL HIGH (ref <1.0)

## 2014-08-07 LAB — APTT: aPTT: 29 seconds (ref 24–37)

## 2014-08-07 LAB — PROTIME-INR
INR: 1.11 (ref 0.00–1.49)
PROTHROMBIN TIME: 14.5 s (ref 11.6–15.2)

## 2014-08-07 SURGERY — REMOVAL, TOTAL ARTHROPLASTY HARDWARE, KNEE, WITH ANTIBIOTIC SPACER INSERTION
Anesthesia: General | Site: Knee | Laterality: Left

## 2014-08-07 MED ORDER — 0.9 % SODIUM CHLORIDE (POUR BTL) OPTIME
TOPICAL | Status: DC | PRN
  Administered 2014-08-07: 1000 mL

## 2014-08-07 MED ORDER — METOCLOPRAMIDE HCL 5 MG/ML IJ SOLN: 5.0000 mg | Freq: Three times a day (TID) | INTRAMUSCULAR | Status: DC | PRN

## 2014-08-07 MED ORDER — ZOLPIDEM TARTRATE 5 MG PO TABS: 10.0000 mg | ORAL_TABLET | Freq: Every evening | ORAL | Status: DC | PRN

## 2014-08-07 MED ORDER — AMLODIPINE BESYLATE 5 MG PO TABS
5.0000 mg | ORAL_TABLET | Freq: Every day | ORAL | Status: DC
  Administered 2014-08-07: 5 mg via ORAL
  Filled 2014-08-07: qty 1

## 2014-08-07 MED ORDER — METHOCARBAMOL 500 MG PO TABS
500.0000 mg | ORAL_TABLET | Freq: Four times a day (QID) | ORAL | Status: DC | PRN
  Administered 2014-08-08 (×2): 500 mg via ORAL
  Filled 2014-08-07 (×3): qty 1

## 2014-08-07 MED ORDER — BUPIVACAINE HCL (PF) 0.25 % IJ SOLN
INTRAMUSCULAR | Status: AC
  Filled 2014-08-07: qty 30

## 2014-08-07 MED ORDER — ROCURONIUM BROMIDE 50 MG/5ML IV SOLN
INTRAVENOUS | Status: AC
  Filled 2014-08-07: qty 1

## 2014-08-07 MED ORDER — GLYCOPYRROLATE 0.2 MG/ML IJ SOLN
INTRAMUSCULAR | Status: DC | PRN
  Administered 2014-08-07: .7 mg via INTRAVENOUS

## 2014-08-07 MED ORDER — ONDANSETRON HCL 4 MG/2ML IJ SOLN: 4.0000 mg | Freq: Once | INTRAMUSCULAR | Status: DC | PRN

## 2014-08-07 MED ORDER — CEFAZOLIN SODIUM-DEXTROSE 2-3 GM-% IV SOLR
INTRAVENOUS | Status: AC
  Filled 2014-08-07: qty 50

## 2014-08-07 MED ORDER — PROPOFOL 10 MG/ML IV BOLUS
INTRAVENOUS | Status: AC
  Filled 2014-08-07: qty 20

## 2014-08-07 MED ORDER — GABAPENTIN 400 MG PO CAPS
400.0000 mg | ORAL_CAPSULE | Freq: Every day | ORAL | Status: DC
  Administered 2014-08-07 – 2014-08-08 (×2): 400 mg via ORAL
  Filled 2014-08-07 (×2): qty 1

## 2014-08-07 MED ORDER — NEOSTIGMINE METHYLSULFATE 10 MG/10ML IV SOLN
INTRAVENOUS | Status: DC | PRN
  Administered 2014-08-07: 4 mg via INTRAVENOUS

## 2014-08-07 MED ORDER — MIDAZOLAM HCL 2 MG/2ML IJ SOLN
INTRAMUSCULAR | Status: AC
  Filled 2014-08-07: qty 2

## 2014-08-07 MED ORDER — VANCOMYCIN HCL 1000 MG IV SOLR
INTRAVENOUS | Status: DC | PRN
  Administered 2014-08-07: 1000 mg

## 2014-08-07 MED ORDER — HYDROMORPHONE HCL 1 MG/ML IJ SOLN
0.5000 mg | INTRAMUSCULAR | Status: AC | PRN
  Administered 2014-08-07 (×4): 0.5 mg via INTRAVENOUS

## 2014-08-07 MED ORDER — ONDANSETRON HCL 4 MG/2ML IJ SOLN
INTRAMUSCULAR | Status: AC
  Filled 2014-08-07: qty 2

## 2014-08-07 MED ORDER — BUPIVACAINE-EPINEPHRINE (PF) 0.5% -1:200000 IJ SOLN
INTRAMUSCULAR | Status: DC | PRN
  Administered 2014-08-07: 30 mL via PERINEURAL

## 2014-08-07 MED ORDER — SUCCINYLCHOLINE CHLORIDE 20 MG/ML IJ SOLN
INTRAMUSCULAR | Status: DC | PRN
  Administered 2014-08-07: 40 mg via INTRAVENOUS
  Administered 2014-08-07: 80 mg via INTRAVENOUS

## 2014-08-07 MED ORDER — INSULIN ASPART 100 UNIT/ML ~~LOC~~ SOLN
0.0000 [IU] | Freq: Three times a day (TID) | SUBCUTANEOUS | Status: DC
  Administered 2014-08-09 – 2014-08-10 (×2): 3 [IU] via SUBCUTANEOUS
  Administered 2014-08-11: 2 [IU] via SUBCUTANEOUS
  Administered 2014-08-11 (×2): 1 [IU] via SUBCUTANEOUS

## 2014-08-07 MED ORDER — CEFAZOLIN SODIUM 1-5 GM-% IV SOLN
1.0000 g | Freq: Four times a day (QID) | INTRAVENOUS | Status: AC
  Administered 2014-08-07 – 2014-08-08 (×2): 1 g via INTRAVENOUS
  Filled 2014-08-07 (×3): qty 50

## 2014-08-07 MED ORDER — FENTANYL CITRATE (PF) 100 MCG/2ML IJ SOLN
INTRAMUSCULAR | Status: DC | PRN
  Administered 2014-08-07 (×2): 50 ug via INTRAVENOUS

## 2014-08-07 MED ORDER — HYDROMORPHONE HCL 1 MG/ML IJ SOLN
INTRAMUSCULAR | Status: AC
  Filled 2014-08-07: qty 1

## 2014-08-07 MED ORDER — DEXTROSE 50 % IV SOLN: 25.0000 g | Freq: Once | INTRAVENOUS | Status: DC

## 2014-08-07 MED ORDER — CEFAZOLIN SODIUM-DEXTROSE 2-3 GM-% IV SOLR
2.0000 g | INTRAVENOUS | Status: AC
  Administered 2014-08-07: 2 g via INTRAVENOUS

## 2014-08-07 MED ORDER — ACETAMINOPHEN 325 MG PO TABS
650.0000 mg | ORAL_TABLET | Freq: Four times a day (QID) | ORAL | Status: DC | PRN
  Administered 2014-08-08: 650 mg via ORAL
  Filled 2014-08-07: qty 2

## 2014-08-07 MED ORDER — GLIPIZIDE 5 MG PO TABS
5.0000 mg | ORAL_TABLET | Freq: Every day | ORAL | Status: DC
  Administered 2014-08-08 – 2014-08-12 (×3): 5 mg via ORAL
  Filled 2014-08-07 (×3): qty 1

## 2014-08-07 MED ORDER — SODIUM CHLORIDE 0.9 % IV SOLN
INTRAVENOUS | Status: DC
  Administered 2014-08-07 (×2): via INTRAVENOUS

## 2014-08-07 MED ORDER — ONDANSETRON HCL 4 MG/2ML IJ SOLN: 4.0000 mg | Freq: Four times a day (QID) | INTRAMUSCULAR | Status: DC | PRN

## 2014-08-07 MED ORDER — PROPOFOL 10 MG/ML IV BOLUS
INTRAVENOUS | Status: DC | PRN
  Administered 2014-08-07: 150 mg via INTRAVENOUS

## 2014-08-07 MED ORDER — MIDAZOLAM HCL 5 MG/5ML IJ SOLN
INTRAMUSCULAR | Status: DC | PRN
  Administered 2014-08-07 (×2): 1 mg via INTRAVENOUS

## 2014-08-07 MED ORDER — METOCLOPRAMIDE HCL 5 MG PO TABS: 5.0000 mg | ORAL_TABLET | Freq: Three times a day (TID) | ORAL | Status: DC | PRN

## 2014-08-07 MED ORDER — FENTANYL CITRATE (PF) 250 MCG/5ML IJ SOLN
INTRAMUSCULAR | Status: AC
  Filled 2014-08-07: qty 5

## 2014-08-07 MED ORDER — ONDANSETRON HCL 4 MG/2ML IJ SOLN
INTRAMUSCULAR | Status: DC | PRN
  Administered 2014-08-07: 4 mg via INTRAVENOUS

## 2014-08-07 MED ORDER — OXYCODONE HCL 5 MG PO TABS
ORAL_TABLET | ORAL | Status: AC
  Filled 2014-08-07: qty 1

## 2014-08-07 MED ORDER — FENTANYL CITRATE (PF) 100 MCG/2ML IJ SOLN
INTRAMUSCULAR | Status: AC
  Filled 2014-08-07: qty 2

## 2014-08-07 MED ORDER — SODIUM CHLORIDE 0.9 % IR SOLN
Status: DC | PRN
  Administered 2014-08-07: 3000 mL

## 2014-08-07 MED ORDER — CHLORHEXIDINE GLUCONATE 4 % EX LIQD: 60.0000 mL | Freq: Once | CUTANEOUS | Status: DC

## 2014-08-07 MED ORDER — LIDOCAINE HCL (CARDIAC) 20 MG/ML IV SOLN
INTRAVENOUS | Status: AC
  Filled 2014-08-07: qty 5

## 2014-08-07 MED ORDER — HYDROMORPHONE HCL 1 MG/ML IJ SOLN
1.0000 mg | INTRAMUSCULAR | Status: DC | PRN
  Administered 2014-08-07 – 2014-08-09 (×4): 1 mg via INTRAVENOUS
  Filled 2014-08-07 (×3): qty 1

## 2014-08-07 MED ORDER — OXYCODONE HCL 5 MG PO TABS
5.0000 mg | ORAL_TABLET | ORAL | Status: DC | PRN
  Administered 2014-08-07: 10 mg via ORAL
  Administered 2014-08-07: 5 mg via ORAL
  Administered 2014-08-08 (×2): 10 mg via ORAL
  Administered 2014-08-08: 5 mg via ORAL
  Administered 2014-08-08 – 2014-08-09 (×2): 10 mg via ORAL
  Filled 2014-08-07: qty 1
  Filled 2014-08-07 (×5): qty 2

## 2014-08-07 MED ORDER — HYDROMORPHONE HCL 1 MG/ML IJ SOLN
0.5000 mg | Freq: Once | INTRAMUSCULAR | Status: AC
  Administered 2014-08-07: 0.5 mg via INTRAVENOUS

## 2014-08-07 MED ORDER — NEOSTIGMINE METHYLSULFATE 10 MG/10ML IV SOLN
INTRAVENOUS | Status: AC
  Filled 2014-08-07: qty 1

## 2014-08-07 MED ORDER — VANCOMYCIN HCL 1000 MG IV SOLR
INTRAVENOUS | Status: AC
  Filled 2014-08-07: qty 1000

## 2014-08-07 MED ORDER — VANCOMYCIN HCL 500 MG IV SOLR
INTRAVENOUS | Status: DC | PRN
  Administered 2014-08-07: 500 mg

## 2014-08-07 MED ORDER — ACETAMINOPHEN 650 MG RE SUPP: 650.0000 mg | Freq: Four times a day (QID) | RECTAL | Status: DC | PRN

## 2014-08-07 MED ORDER — SUGAMMADEX SODIUM 200 MG/2ML IV SOLN
INTRAVENOUS | Status: AC
  Filled 2014-08-07: qty 2

## 2014-08-07 MED ORDER — DEXTROSE 50 % IV SOLN
INTRAVENOUS | Status: AC
  Administered 2014-08-07: 25 mL
  Filled 2014-08-07: qty 50

## 2014-08-07 MED ORDER — METHOCARBAMOL 1000 MG/10ML IJ SOLN
500.0000 mg | Freq: Four times a day (QID) | INTRAVENOUS | Status: DC | PRN
  Administered 2014-08-07: 500 mg via INTRAVENOUS
  Filled 2014-08-07 (×2): qty 5

## 2014-08-07 MED ORDER — LIDOCAINE HCL (CARDIAC) 20 MG/ML IV SOLN
INTRAVENOUS | Status: DC | PRN
  Administered 2014-08-07: 100 mg via INTRAVENOUS

## 2014-08-07 MED ORDER — SUCCINYLCHOLINE CHLORIDE 20 MG/ML IJ SOLN
INTRAMUSCULAR | Status: AC
  Filled 2014-08-07: qty 1

## 2014-08-07 MED ORDER — ONDANSETRON HCL 4 MG PO TABS: 4.0000 mg | ORAL_TABLET | Freq: Four times a day (QID) | ORAL | Status: DC | PRN

## 2014-08-07 MED ORDER — GLYCOPYRROLATE 0.2 MG/ML IJ SOLN
INTRAMUSCULAR | Status: AC
  Filled 2014-08-07: qty 3

## 2014-08-07 MED ORDER — MIDAZOLAM HCL 2 MG/2ML IJ SOLN
INTRAMUSCULAR | Status: DC
Start: 2014-08-07 — End: 2014-08-07
  Filled 2014-08-07: qty 2

## 2014-08-07 MED ORDER — VANCOMYCIN HCL 500 MG IV SOLR
INTRAVENOUS | Status: AC
  Filled 2014-08-07: qty 500

## 2014-08-07 MED ORDER — SODIUM CHLORIDE 0.9 % IV SOLN: INTRAVENOUS | Status: DC

## 2014-08-07 SURGICAL SUPPLY — 60 items
BLADE SAW SAG 90X13X1.27 (BLADE) IMPLANT
BLADE SAW SGTL 13.0X1.19X90.0M (BLADE) IMPLANT
BLADE SAW SGTL 81X20 HD (BLADE) ×3 IMPLANT
BNDG COHESIVE 6X5 TAN STRL LF (GAUZE/BANDAGES/DRESSINGS) ×3 IMPLANT
BNDG GAUZE ELAST 4 BULKY (GAUZE/BANDAGES/DRESSINGS) ×3 IMPLANT
BONE CEMENT PALACOSE (Orthopedic Implant) ×12 IMPLANT
BOWL SMART MIX CTS (DISPOSABLE) ×6 IMPLANT
CEMENT BONE PALACOSE (Orthopedic Implant) ×4 IMPLANT
COVER BACK TABLE 24X17X13 BIG (DRAPES) IMPLANT
COVER SURGICAL LIGHT HANDLE (MISCELLANEOUS) ×3 IMPLANT
CUFF TOURNIQUET SINGLE 34IN LL (TOURNIQUET CUFF) IMPLANT
CUFF TOURNIQUET SINGLE 44IN (TOURNIQUET CUFF) IMPLANT
DRAPE EXTREMITY T 121X128X90 (DRAPE) ×3 IMPLANT
DRAPE IMP U-DRAPE 54X76 (DRAPES) ×3 IMPLANT
DRAPE PROXIMA HALF (DRAPES) ×3 IMPLANT
DRAPE U-SHAPE 47X51 STRL (DRAPES) ×3 IMPLANT
DRSG ADAPTIC 3X8 NADH LF (GAUZE/BANDAGES/DRESSINGS) ×3 IMPLANT
DRSG PAD ABDOMINAL 8X10 ST (GAUZE/BANDAGES/DRESSINGS) ×3 IMPLANT
DURAPREP 26ML APPLICATOR (WOUND CARE) ×3 IMPLANT
ELECT REM PT RETURN 9FT ADLT (ELECTROSURGICAL) ×3
ELECTRODE REM PT RTRN 9FT ADLT (ELECTROSURGICAL) ×1 IMPLANT
FACESHIELD WRAPAROUND (MASK) IMPLANT
GAUZE SPONGE 4X4 12PLY STRL (GAUZE/BANDAGES/DRESSINGS) ×3 IMPLANT
GLOVE BIOGEL PI IND STRL 9 (GLOVE) ×1 IMPLANT
GLOVE BIOGEL PI INDICATOR 9 (GLOVE) ×2
GLOVE SURG ORTHO 9.0 STRL STRW (GLOVE) ×6 IMPLANT
GOWN STRL REUS W/ TWL XL LVL3 (GOWN DISPOSABLE) ×3 IMPLANT
GOWN STRL REUS W/TWL XL LVL3 (GOWN DISPOSABLE) ×6
HANDPIECE INTERPULSE COAX TIP (DISPOSABLE) ×2
IMMOBILIZER KNEE 22 UNIV (SOFTGOODS) ×3 IMPLANT
KASM LARGE 53A/P X 75 ML /L FEMORAL SPACER MOLD ×3 IMPLANT
KIT BASIN OR (CUSTOM PROCEDURE TRAY) ×3 IMPLANT
KIT ROOM TURNOVER OR (KITS) ×3 IMPLANT
LARGE 52A/P X81M/L TIBIAL SPACER MOLD ×3 IMPLANT
MANIFOLD NEPTUNE II (INSTRUMENTS) ×3 IMPLANT
MOLD SPACER FEM KNEE 53A/PX75M (Spacer) ×1 IMPLANT
MOLD SPACER TIB KNEE 52A/PX81M (Spacer) ×1 IMPLANT
NEEDLE SPNL 18GX3.5 QUINCKE PK (NEEDLE) IMPLANT
NS IRRIG 1000ML POUR BTL (IV SOLUTION) ×3 IMPLANT
PACK TOTAL JOINT (CUSTOM PROCEDURE TRAY) ×3 IMPLANT
PACK UNIVERSAL I (CUSTOM PROCEDURE TRAY) ×3 IMPLANT
PAD ARMBOARD 7.5X6 YLW CONV (MISCELLANEOUS) ×6 IMPLANT
PADDING CAST COTTON 6X4 STRL (CAST SUPPLIES) ×3 IMPLANT
PENCIL BUTTON HOLSTER BLD 10FT (ELECTRODE) ×3 IMPLANT
SET HNDPC FAN SPRY TIP SCT (DISPOSABLE) ×1 IMPLANT
SPACERMOLD FEM KNEE 53A/PX75M (Spacer) ×3 IMPLANT
SPACERMOLD TIB KNEE 52A/PX81M (Spacer) ×3 IMPLANT
STAPLER VISISTAT 35W (STAPLE) ×3 IMPLANT
SUCTION FRAZIER TIP 10 FR DISP (SUCTIONS) IMPLANT
SUT VIC AB 0 CTB1 27 (SUTURE) ×3 IMPLANT
SUT VIC AB 1 CTX 36 (SUTURE) ×2
SUT VIC AB 1 CTX36XBRD ANBCTR (SUTURE) ×1 IMPLANT
SUT VIC AB 2-0 CTB1 (SUTURE) ×3 IMPLANT
TOWEL OR 17X24 6PK STRL BLUE (TOWEL DISPOSABLE) ×3 IMPLANT
TOWEL OR 17X26 10 PK STRL BLUE (TOWEL DISPOSABLE) ×3 IMPLANT
TRAY FOLEY CATH 16FRSI W/METER (SET/KITS/TRAYS/PACK) IMPLANT
TUBE ANAEROBIC SPECIMEN COL (MISCELLANEOUS) IMPLANT
WATER STERILE IRR 1000ML POUR (IV SOLUTION) IMPLANT
WRAP KNEE MAXI GEL POST OP (GAUZE/BANDAGES/DRESSINGS) ×3 IMPLANT
YANKAUER SUCT BULB TIP NO VENT (SUCTIONS) ×3 IMPLANT

## 2014-08-07 NOTE — H&P (Signed)
TOTAL KNEE REVISION ADMISSION H&P  Patient is being admitted for left revision total knee arthroplasty.  Subjective:  Chief Complaint:left knee pain.  HPI: Matthew Kline, 54 y.o. male, has a history of pain and functional disability in the left knee(s) due to Septic knee and patient has failed non-surgical conservative treatments for greater than 12 weeks to include IV antibiotics and placement of antibiotics beads. The indications for the revision of the total knee arthroplasty are Septic knee. Onset of symptoms was abrupt starting 8 years ago with rapidlly worsening course since that time.  Prior procedures on the left knee(s) include Exchange polyethylene tray.  Patient currently rates pain in the left knee(s) at 8 out of 10 with activity. There is worsening of pain with activity and weight bearing.  Patient has evidence of Septic joint by imaging studies. This condition presents safety issues increasing the risk of falls. This patient has had Failure of maintaining current implants.  There is no current active infection.  Patient Active Problem List   Diagnosis Date Noted  . Tachycardia 07/24/2014  . Acute upper respiratory infection 07/24/2014  . Sepsis 07/14/2014  . ESRD (end stage renal disease) 07/14/2014  . Type II diabetes mellitus 07/14/2014  . Anemia in chronic kidney disease 07/14/2014  . Infection of total knee replacement 05/31/2014  . Surgical wound dehiscence 05/09/2014  . Dehiscence of closure of skin 05/09/2014  . Total knee replacement status 04/10/2014  . Diabetes mellitus with renal manifestations, controlled 10/24/2013  . Hypertensive renal disease 06/27/2013  . DM type 2 causing vascular disease 06/27/2013  . Erectile dysfunction 06/27/2013  . Depression 06/27/2013  . Claudication of left lower extremity 12/19/2012  . Essential hypertension, benign 12/19/2012  . Sinusitis, acute maxillary 11/22/2012  . Otitis, externa, infective 11/14/2012  . Leg edema,  left 11/14/2012  . End stage renal disease 10/02/2012  . Controlled type 2 DM with proteinuria or microalbuminuria 09/19/2012  . GERD (gastroesophageal reflux disease) 09/19/2012  . Leukocytosis, unspecified 09/19/2012  . Lacunar infarction 08/17/2012  . Polymyalgia rheumatica 08/17/2012  . 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 Unspecified essential hypertension 05/10/2012  . Unspecified vitamin D deficiency 05/10/2012  . Diabetes mellitus due to underlying condition 05/10/2012  . Hyperlipidemia LDL goal <100 05/10/2012  . Anemia of chronic disease 05/10/2012  . Screening for prostate cancer 05/10/2012  . Chronic kidney disease (CKD), stage IV (severe) 05/10/2012  . Peripheral autonomic neuropathy due to DM 05/10/2012  . Callus of foot 05/10/2012  . Urgency of urination 05/10/2012  . Hyperkalemia 05/10/2012  . Candidiasis of the esophagus 10/12/2011  . Internal hemorrhoids without mention of complication [id number]  . Pre-syncope 07/25/2009  . DJD (degenerative joint disease) of cervical spine 02/17/2009   Past Medical History  Diagnosis Date  . Polymyalgia rheumatica   . Hypertension   . Diabetic neuropathy   . Osteomyelitis of foot, left, acute   . Anxiety   . Insomnia, unspecified   . Unspecified vitamin D deficiency   . Anemia, unspecified   . Other chronic postoperative pain   . Unspecified hereditary and idiopathic peripheral neuropathy   . Allergy   . Unspecified osteomyelitis, site unspecified   . Long term (current) use of anticoagulants   . 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 Ulcer     diabetic foot   . Arthralgia 2010    polyarticular  . Hemorrhoids, internal 10/2011    small  . CHF (congestive heart failure)   . CHF (  congestive heart failure) 07/25/2009    denies  . Hemodialysis access site with mature fistula   . Myocardial infarction 1995  . Pneumonia     "probably 4-5 times" (05/09/2014)  . Sleep apnea     "lost weight; no more problem"  (05/09/2014)  . Type II diabetes mellitus dx'd 1995  . History of blood transfusion     "related to the anemia"  . GERD (gastroesophageal reflux disease)     hx "before I lost weight"  . ESRD (end stage renal disease) on dialysis     started 12/2012; "MWF; [facility]"  . Stroke 01/10/06    denies residual on 05/09/2014  . Arthritis     "back, knees" (05/09/2014)  . Chronic lower back pain     Past Surgical History  Procedure Laterality Date  . Amputation  01/21/2012    Procedure: AMPUTATION RAY;  Surgeon: [name], MD;  Location: [place];  Service: Orthopedics;  Laterality: Left;  Left Foot 4th Ray Amputation  . Anterior cervical decomp/discectomy fusion  02/2011  . Knee arthroscopy Left 08-25-2012  . Toe amputation Bilateral     "I've lost 7 toes over the last 7 years" (05/09/2014)  . Refractive surgery Bilateral   . Bascilic vein transposition Left 10/19/2012    Procedure: BASCILIC VEIN TRANSPOSITION;  Surgeon: Serafina Mitchell, MD;  Location: [place];  Service: Vascular;  Laterality: Left;  . Tonsillectomy    . Amputation Left 05/04/2013    Procedure: AMPUTATION DIGIT;  Surgeon: [name], MD;  Location: [place];  Service: Orthopedics;  Laterality: Left;  Left Great Toe Amputation at [facility]  . Toe surgery Left April 2015    Big toe removed on left foot.  . Total knee arthroplasty Left 04/10/2014    Procedure: TOTAL KNEE ARTHROPLASTY;  Surgeon: [name], MD;  Location: [place];  Service: Orthopedics;  Laterality: Left;  . Wound debridement Left 05/09/2014    Dehiscence Left Total Knee Arthroplasty Incision  . Back surgery    . Uvulopalatopharyngoplasty, tonsillectomy and septoplasty  ~ 1989  . I&d extremity Left 05/09/2014    Procedure: Irrigation and Debridement Left Knee and Closure of Total Knee Arthroplasty Incision;  Surgeon: [name], MD;  Location: [place];  Service: Orthopedics;  Laterality: Left;  . I&d knee with poly exchange Left 05/31/2014    Procedure: IRRIGATION AND  DEBRIDEMENT LEFT KNEE, PLACE ANTIBIOTIC BEADS,  POLY EXCHANGE;  Surgeon: [name], MD;  Location: [place];  Service: Orthopedics;  Laterality: Left;    No prescriptions prior to admission   Allergies  Allergen Reactions  . Morphine And Related Other (See Comments)    hallucinations  . Tygacil [Tigecycline] Other (See Comments)    Makes him feel crazy    History  Substance Use Topics  . Smoking status: Current Some Day Smoker -- 0.12 packs/day for 32 years    Types: Cigarettes  . Smokeless tobacco: Never Used     Comment: 3 -4 cig daily   . Alcohol Use: No    Family History  Problem Relation Age of Onset  . Hypertension Mother   . Cancer Mother 50    Ovarian  . Heart disease Maternal Aunt   . Stroke Maternal Grandfather       Review of Systems  All other systems reviewed and are negative.    Objective:  Physical Exam  Vital signs in last 24 hours:    Labs:  Estimated body  mass index is 27.74 kg/(m^2) as calculated from the following:   Height as of 07/24/14: 5\' 11"  (1.803 m).   Weight as of 07/24/14: 90.175 kg (198 lb 12.8 oz).  Imaging Review Plain radiographs demonstrate No loosening degenerative joint disease of the left knee(s). The overall alignment is neutral.There is evidence of loosening of the None components. The bone quality appears to be adequate for age and reported activity level. There is a septic joint.  Assessment/Plan:  End stage arthritis, left knee(s) with failed previous arthroplasty.   The patient history, physical examination, clinical judgment of the provider and imaging studies are consistent with end stage degenerative joint disease of the left knee(s), previous total knee arthroplasty. Revision total knee arthroplasty is deemed medically necessary. The treatment options including medical management, injection therapy, arthroscopy and revision arthroplasty were discussed at length. The risks and benefits of revision total knee arthroplasty  were presented and reviewed. The risks due to aseptic loosening, infection, stiffness, patella tracking problems, thromboembolic complications and other imponderables were discussed. The patient acknowledged the explanation, agreed to proceed with the plan and consent was signed. Patient is being admitted for inpatient treatment for surgery, pain control, PT, OT, prophylactic antibiotics, VTE prophylaxis, progressive ambulation and ADL's and discharge planning.The patient is planning to be discharged to skilled nursing facility

## 2014-08-07 NOTE — Op Note (Signed)
08/07/2014  5:47 PM  PATIENT:  Matthew Kline    PRE-OPERATIVE DIAGNOSIS:  Septic Left Total Knee Arthroplasty  POST-OPERATIVE DIAGNOSIS:  Same  PROCEDURE:  Replace Left Total Knee Arthroplasty,  Place Antibiotic Spacer  SURGEON:  [name], MD  PHYSICIAN ASSISTANT:None ANESTHESIA:   General  PREOPERATIVE INDICATIONS:  Matthew Kline is a  54 y.o. male with a diagnosis of Septic Left Total Knee Arthroplasty who failed conservative measures and elected for surgical management.    The risks benefits and alternatives were discussed with the patient preoperatively including but not limited to the risks of infection, bleeding, nerve injury, cardiopulmonary complications, the need for revision surgery, among others, and the patient was willing to proceed.  OPERATIVE IMPLANTS: Antibiotics impregnated total knee replacement with both femoral and tibial component. 3 g of vancomycin in the components an additional 1 g of vancomycin in the cement for the tibial tray  OPERATIVE FINDINGS: No purulent abscess inflamed soft tissue  OPERATIVE PROCEDURE: Patient was brought to the operating room and underwent a general and aesthetic after a femoral block. After adequate levels anesthesia obtained patient's left lower extremity was prepped using DuraPrep draped into a sterile field. A timeout was called. Previous midline incision was used carried down to a medial parapatellar retinacular incision. Patient had extreme amount of inflamed tissue and this was debrided sharply with both a knife and a Ronjair. The components removed including the patella femur and tibia with a oscillating saw. Loose cement was removed the bone was debrided with a curet. Further excision of the soft tissue inflamed lining was removed. The knee was irrigated with pulsatile lavage for 3 L. Antibiotics impregnated femoral component was then fabricated for a size large and inserted and held in place until the cement hardened.  The tibial tray was then made to conform to the balance For extension with a 12 mm tibial tray. This was allowed to harden and then freshman was mixed and secured to the tibia. The knee was left in extension further irrigation and debridement was performed. The retinaculum was closed using #1 Vicryls. Subcutaneous is closed using 0 Vicryls. Skin was closed using staples. A compressive dressing was applied patient was placed in a knee immobilizer extubated and taken to the PACU in stable condition.

## 2014-08-07 NOTE — Transfer of Care (Signed)
Immediate Anesthesia Transfer of Care Note  Patient: Matthew Kline  Procedure(s) Performed: Procedure(s): Replace Left Total Knee Arthroplasty,  Place Antibiotic Spacer (Left)  Patient Location: PACU  Anesthesia Type:GA combined with regional for post-op pain  Level of Consciousness: awake, alert  and oriented  Airway & Oxygen Therapy: Patient Spontanous Breathing and Patient connected to nasal cannula oxygen  Post-op Assessment: Report given to RN and Post -op Vital signs reviewed and stable  Post vital signs: Reviewed and stable  Last Vitals:  Filed Vitals:   08/07/14 1547  BP:   Pulse: 95  Temp:   Resp: 16    Complications: No apparent anesthesia complications

## 2014-08-07 NOTE — Anesthesia Procedure Notes (Addendum)
Anesthesia Regional Block:  Adductor canal block  Pre-Anesthetic Checklist: ,, timeout performed, Correct Patient, Correct Site, Correct Laterality, Correct Procedure, Correct Position, site marked, Risks and benefits discussed, Surgical consent,  Pre-op evaluation,  Post-op pain management  Laterality: Left  Prep: chloraprep       Needles:  Injection technique: Single-shot  Needle Type: Stimiplex     Needle Length: 9cm 9 cm Needle Gauge: 21 and 21 G    Additional Needles:  Procedures: ultrasound guided (picture in chart) Adductor canal block Narrative:  Injection made incrementally with aspirations every 5 mL.  Performed by: Personally  Anesthesiologist: Nolon Nations  Additional Notes: BP cuff, EKG monitors applied. Sedation begun. Artery and nerve location verified with U/S and anesthetic injected incrementally, slowly, and after negative aspirations under direct u/s guidance. Good fascial /perineural spread. Tolerated well.   Procedure Name: Intubation Date/Time: 08/07/2014 4:28 PM Performed by: Terrill Mohr Pre-anesthesia Checklist: Emergency Drugs available, Patient identified, Suction available and Patient being monitored Patient Re-evaluated:Patient Re-evaluated prior to inductionOxygen Delivery Method: Circle system utilized Preoxygenation: Pre-oxygenation with 100% oxygen Intubation Type: IV induction Ventilation: Mask ventilation without difficulty Laryngoscope Size: Mac and 4 Grade View: Grade II Tube type: Oral Tube size: 7.5 mm Number of attempts: 1 Airway Equipment and Method: Stylet Placement Confirmation: ETT inserted through vocal cords under direct vision and breath sounds checked- equal and bilateral Secured at: 23 (cm at teeth) cm Tube secured with: Tape Dental Injury: Teeth and Oropharynx as per pre-operative assessment

## 2014-08-07 NOTE — Anesthesia Preprocedure Evaluation (Addendum)
Anesthesia Evaluation  Patient identified by MRN, date of birth, ID band Patient awake    Reviewed: Allergy & Precautions, NPO status , Patient's Chart, lab work & pertinent test results  Airway Mallampati: I  TM Distance: >3 FB Neck ROM: Limited    Dental  (+) Teeth Intact, Dental Advisory Given   Pulmonary sleep apnea , Current Smoker,    Pulmonary exam normal       Cardiovascular hypertension, Pt. on medications + CAD, + Past MI, + Peripheral Vascular Disease and +CHF Normal cardiovascular exam    Neuro/Psych  Neuromuscular disease CVA, No Residual Symptoms    GI/Hepatic GERD-  Controlled and Medicated,  Endo/Other  diabetes, Well Controlled, Type 2, Oral Hypoglycemic Agents  Renal/GU Dialysis and ESRFRenal diseaseM-Wed-Fr via left arm AVG     Musculoskeletal  (+) Arthritis -,   Abdominal   Peds  Hematology  (+) anemia ,   Anesthesia Other Findings Septic joint.  Full beard.  Reproductive/Obstetrics                           Anesthesia Physical Anesthesia Plan  ASA: III  Anesthesia Plan: General   Post-op Pain Management:    Induction: Intravenous  Airway Management Planned: Oral ETT  Additional Equipment:   Intra-op Plan:   Post-operative Plan: Extubation in OR  Informed Consent: I have reviewed the patients History and Physical, chart, labs and discussed the procedure including the risks, benefits and alternatives for the proposed anesthesia with the patient or authorized representative who has indicated his/her understanding and acceptance.     Plan Discussed with: CRNA, Anesthesiologist and Surgeon  Anesthesia Plan Comments:         Anesthesia Quick Evaluation

## 2014-08-08 ENCOUNTER — Encounter (HOSPITAL_COMMUNITY): Payer: Self-pay | Admitting: Orthopedic Surgery

## 2014-08-08 DIAGNOSIS — R000 Tachycardia, unspecified: Secondary | ICD-10-CM

## 2014-08-08 DIAGNOSIS — N186 End stage renal disease: Secondary | ICD-10-CM

## 2014-08-08 DIAGNOSIS — R509 Fever, unspecified: Secondary | ICD-10-CM

## 2014-08-08 DIAGNOSIS — E1122 Type 2 diabetes mellitus with diabetic chronic kidney disease: Secondary | ICD-10-CM

## 2014-08-08 DIAGNOSIS — T8454XA Infection and inflammatory reaction due to internal left knee prosthesis, initial encounter: Principal | ICD-10-CM

## 2014-08-08 DIAGNOSIS — B9689 Other specified bacterial agents as the cause of diseases classified elsewhere: Secondary | ICD-10-CM

## 2014-08-08 DIAGNOSIS — Z992 Dependence on renal dialysis: Secondary | ICD-10-CM

## 2014-08-08 DIAGNOSIS — I120 Hypertensive chronic kidney disease with stage 5 chronic kidney disease or end stage renal disease: Secondary | ICD-10-CM

## 2014-08-08 LAB — GLUCOSE, CAPILLARY
GLUCOSE-CAPILLARY: 84 mg/dL (ref 65–99)
GLUCOSE-CAPILLARY: 111 mg/dL — AB (ref 65–99)
Glucose-Capillary: 126 mg/dL — ABNORMAL HIGH (ref 65–99)

## 2014-08-08 LAB — RENAL FUNCTION PANEL
ALBUMIN: 2.4 g/dL — AB (ref 3.5–5.0)
Anion gap: 10 (ref 5–15)
BUN: 47 mg/dL — ABNORMAL HIGH (ref 6–20)
CHLORIDE: 101 mmol/L (ref 101–111)
CO2: 23 mmol/L (ref 22–32)
CREATININE: 7.29 mg/dL — AB (ref 0.61–1.24)
Calcium: 8.5 mg/dL — ABNORMAL LOW (ref 8.9–10.3)
GFR calc Af Amer: 9 mL/min — ABNORMAL LOW (ref >60)
GFR calc non Af Amer: 8 mL/min — ABNORMAL LOW (ref >60)
GLUCOSE: 109 mg/dL — AB (ref 65–99)
Phosphorus: 6.5 mg/dL — ABNORMAL HIGH (ref 2.5–4.6)
Potassium: 5.7 mmol/L — ABNORMAL HIGH (ref 3.5–5.1)
Sodium: 134 mmol/L — ABNORMAL LOW (ref 135–145)

## 2014-08-08 LAB — CBC WITH DIFFERENTIAL/PLATELET
BASOS ABS: 0 10*3/uL (ref 0.0–0.1)
BASOS PCT: 1 % (ref 0–1)
Eosinophils Absolute: 0.1 10*3/uL (ref 0.0–0.7)
Eosinophils Relative: 2 % (ref 0–5)
HCT: 28.6 % — ABNORMAL LOW (ref 39.0–52.0)
Hemoglobin: 8.8 g/dL — ABNORMAL LOW (ref 13.0–17.0)
LYMPHS PCT: 31 % (ref 12–46)
Lymphs Abs: 2.6 10*3/uL (ref 0.7–4.0)
MCH: 27.3 pg (ref 26.0–34.0)
MCHC: 30.8 g/dL (ref 30.0–36.0)
MCV: 88.8 fL (ref 78.0–100.0)
MONO ABS: 1.2 10*3/uL — AB (ref 0.1–1.0)
Monocytes Relative: 15 % — ABNORMAL HIGH (ref 3–12)
NEUTROS PCT: 53 % (ref 43–77)
Neutro Abs: 4.4 10*3/uL (ref 1.7–7.7)
PLATELETS: 225 10*3/uL (ref 150–400)
RBC: 3.22 MIL/uL — AB (ref 4.22–5.81)
RDW: 19.4 % — ABNORMAL HIGH (ref 11.5–15.5)
WBC: 8.4 10*3/uL (ref 4.0–10.5)

## 2014-08-08 LAB — VANCOMYCIN, RANDOM: VANCOMYCIN RM: 17 ug/mL

## 2014-08-08 LAB — TROPONIN I: Troponin I: <0.03 ng/mL (ref <0.031)

## 2014-08-08 MED ORDER — PENTAFLUOROPROP-TETRAFLUOROETH EX AERO: 1.0000 "application " | INHALATION_SPRAY | CUTANEOUS | Status: DC | PRN

## 2014-08-08 MED ORDER — LIDOCAINE HCL (PF) 1 % IJ SOLN: 5.0000 mL | INTRAMUSCULAR | Status: DC | PRN

## 2014-08-08 MED ORDER — HEPARIN SODIUM (PORCINE) 1000 UNIT/ML DIALYSIS: 1000.0000 [IU] | INTRAMUSCULAR | Status: DC | PRN

## 2014-08-08 MED ORDER — SODIUM CHLORIDE 0.9 % IV SOLN: 100.0000 mL | INTRAVENOUS | Status: DC | PRN

## 2014-08-08 MED ORDER — NEPRO/CARBSTEADY PO LIQD: 237.0000 mL | ORAL | Status: DC | PRN

## 2014-08-08 MED ORDER — LIDOCAINE-PRILOCAINE 2.5-2.5 % EX CREA: 1.0000 "application " | TOPICAL_CREAM | CUTANEOUS | Status: DC | PRN

## 2014-08-08 MED ORDER — DEXTROSE 5 % IV SOLN
2.0000 g | Freq: Once | INTRAVENOUS | Status: AC
  Administered 2014-08-08: 2 g via INTRAVENOUS
  Filled 2014-08-08: qty 2

## 2014-08-08 MED ORDER — SODIUM CHLORIDE 0.9 % IV BOLUS (SEPSIS)
250.0000 mL | Freq: Once | INTRAVENOUS | Status: DC
Start: 2014-08-08 — End: 2014-08-12

## 2014-08-08 MED ORDER — ALTEPLASE 2 MG IJ SOLR: 2.0000 mg | Freq: Once | INTRAMUSCULAR | Status: DC | PRN

## 2014-08-08 MED ORDER — ASPIRIN EC 325 MG PO TBEC
325.0000 mg | DELAYED_RELEASE_TABLET | Freq: Every day | ORAL | Status: DC
  Administered 2014-08-10 – 2014-08-12 (×4): 325 mg via ORAL
  Filled 2014-08-08 (×3): qty 1

## 2014-08-08 MED ORDER — BISACODYL 10 MG RE SUPP
10.0000 mg | Freq: Every day | RECTAL | Status: DC | PRN
  Filled 2014-08-08: qty 1

## 2014-08-08 MED ORDER — BISACODYL 10 MG RE SUPP
10.0000 mg | Freq: Once | RECTAL | Status: AC
  Administered 2014-08-08: 10 mg via RECTAL
  Filled 2014-08-08: qty 1

## 2014-08-08 MED ORDER — VANCOMYCIN HCL IN DEXTROSE 1-5 GM/200ML-% IV SOLN
1000.0000 mg | Freq: Once | INTRAVENOUS | Status: AC
  Administered 2014-08-08: 1000 mg via INTRAVENOUS
  Filled 2014-08-08: qty 200

## 2014-08-08 NOTE — Significant Event (Signed)
Rapid Response Event Note Pt POD 1 left total knee replacement. Call received this am per floor RN for Pt complaining of jaw pain 8/10  mid sternal chest tightness and pressure, left knee pain radiating to heel 9/10. Pt states "I feel like my chest is full of fluid, like I need dialysis"  Overview: Time Called: 0513 Arrival Time: 0521 Event Type: Respiratory, Cardiac, Neurologic  Initial Focused Assessment: Pt found resting in bed, anxious. Alert oriented x 4. Pain in jaw 0/10 upon my arrival, chest tightness "comes and goes" denies chest pain. VS prior to my arrival  139/72, 93 bpm, 95% on RA.  Lung sound clear through out, no wheezing or crackles auscultated.   Interventions: Dilaudid 1mg  given IVP at 0526 and EKG obtained. Dr. Lorin Mercy on call Surgeon paged. Call back received at 0535, at that time Pt admitted to reduced chest pressure and pain in left knee 5/10, appears less anxious. MD updated on EKG, Pt assessment, and Pt concerns over HD.  Troponin ordered Stat. Per MD to keep Pt on 2 LNC for now. VS obtained at 0543 128/67, 93 bpm, 99% on 2 LNC, 13 RR.   Pt left resting in bed, denies chest pressure, chest pain, or jaw pain. Drifting off to sleep. RN advised to monitor Pt closley, to notfy provider and/or myself for worsening condition.   Event Summary: Name of Physician Notified: Dr. Lorin Mercy at [place]    at    Outcome: Stayed in room and stabalized  Event End Time: 0600  [place], [place]

## 2014-08-08 NOTE — Consult Note (Signed)
Renal Service Consult Note [facility]  Matthew Kline 08/08/2014 Roney Jaffe D Requesting Physician:  Dr Sharol Given  Reason for Consult:  ESRD pt s/p L knee surgery HPI: The patient is a 54 y.o. year-old with hx of HTN, DM, ESRD on HD MWF nocturnal.  He presented for removal of L knee prosthetic joint. Pt describes that he had a left TKR surgery done in March this year. In April he fell on the knee with dehisced wound and infection and required irrigation/ revision/ washout.  He has had one other irrigation/ debridement procedure in May and now the prosthetic joint has been removed yesterday w placement of AB beads due to persistent inflammation/ infection, this is per the patient, not clear from ortho notes.   Post op stable. Except this am felt SOB with chest pressure. Feels fine now.  No fever, abd pain, n/v/d. +constipated. Urinary retention 600 cc per bladder scan.     Past Medical History  Past Medical History  Diagnosis Date  . Polymyalgia rheumatica   . Hypertension   . Diabetic neuropathy   . Osteomyelitis of foot, left, acute   . Anxiety   . Insomnia, unspecified   . Unspecified vitamin D deficiency   . Anemia, unspecified   . Other chronic postoperative pain   . Unspecified hereditary and idiopathic peripheral neuropathy   . Allergy   . Unspecified osteomyelitis, site unspecified   . Long term (current) use of anticoagulants   . 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 Ulcer     diabetic foot   . Arthralgia 2010    polyarticular  . Hemorrhoids, internal 10/2011    small  . CHF (congestive heart failure)   . CHF (congestive heart failure) 07/25/2009    denies  . Hemodialysis access site with mature fistula   . Myocardial infarction 1995  . Pneumonia     "probably 4-5 times" (05/09/2014)  . Sleep apnea     "lost weight; no more problem" (05/09/2014)  . Type II diabetes mellitus dx'd 1995  . History of blood transfusion     "related to the  anemia"  . GERD (gastroesophageal reflux disease)     hx "before I lost weight"  . ESRD (end stage renal disease) on dialysis     started 12/2012; "MWF; [facility]"  . Stroke 01/10/06    denies residual on 05/09/2014  . Arthritis     "back, knees" (05/09/2014)  . Chronic lower back pain    Past Surgical History  Past Surgical History  Procedure Laterality Date  . Amputation  01/21/2012    Procedure: AMPUTATION RAY;  Surgeon: [name], MD;  Location: [place];  Service: Orthopedics;  Laterality: Left;  Left Foot 4th Ray Amputation  . Anterior cervical decomp/discectomy fusion  02/2011  . Knee arthroscopy Left 08-25-2012  . Toe amputation Bilateral     "I've lost 7 toes over the last 7 years" (05/09/2014)  . Refractive surgery Bilateral   . Bascilic vein transposition Left 10/19/2012    Procedure: BASCILIC VEIN TRANSPOSITION;  Surgeon: Serafina Mitchell, MD;  Location: [place];  Service: Vascular;  Laterality: Left;  . Tonsillectomy    . Amputation Left 05/04/2013    Procedure: AMPUTATION DIGIT;  Surgeon: [name], MD;  Location: [place];  Service: Orthopedics;  Laterality: Left;  Left Great Toe Amputation at [facility]  . Toe surgery Left April 2015    Big toe removed on left foot.  Marland Kitchen  Total knee arthroplasty Left 04/10/2014    Procedure: TOTAL KNEE ARTHROPLASTY;  Surgeon: [name], MD;  Location: [place];  Service: Orthopedics;  Laterality: Left;  . Wound debridement Left 05/09/2014    Dehiscence Left Total Knee Arthroplasty Incision  . Back surgery    . Uvulopalatopharyngoplasty, tonsillectomy and septoplasty  ~ 1989  . I&d extremity Left 05/09/2014    Procedure: Irrigation and Debridement Left Knee and Closure of Total Knee Arthroplasty Incision;  Surgeon: [name], MD;  Location: [place];  Service: Orthopedics;  Laterality: Left;  . I&d knee with poly exchange Left 05/31/2014    Procedure: IRRIGATION AND DEBRIDEMENT LEFT KNEE, PLACE ANTIBIOTIC BEADS,  POLY EXCHANGE;  Surgeon: [name], MD;  Location: [place];  Service: Orthopedics;  Laterality: Left;   Family History  Family History  Problem Relation Age of Onset  . Hypertension Mother   . Cancer Mother 52    Ovarian  . Heart disease Maternal Aunt   . Stroke Maternal Grandfather    Social History  reports that he has been smoking Cigarettes.  He has a 3.84 pack-year smoking history. He has never used smokeless tobacco. He reports that he does not drink alcohol or use illicit drugs. Allergies  Allergies  Allergen Reactions  . Morphine And Related Other (See Comments)    hallucinations  . Tygacil [Tigecycline] Other (See Comments)    Makes him feel crazy   Home medications Prior to Admission medications   Medication Sig Start Date End Date Taking? Authorizing Provider  amLODipine (NORVASC) 5 MG tablet Take 5 mg by mouth daily.  02/22/14  Yes Historical Provider, MD  Cholecalciferol (VITAMIN D PO) Take 1 tablet by mouth every Monday, Wednesday, and Friday. Take at dialysis   Yes Historical Provider, MD  gabapentin (NEURONTIN) 100 MG capsule Take 400 mg by mouth at bedtime.  07/12/14  Yes Monica Carter, DO  glipiZIDE (GLUCOTROL) 5 MG tablet Take 1 tablet (5 mg total) by mouth daily before breakfast. 10/24/13  Yes Mahima Pandey, MD  ibuprofen (ADVIL,MOTRIN) 200 MG tablet Take 200 mg by mouth daily as needed (pain).   Yes Historical Provider, MD  zolpidem (AMBIEN) 10 MG tablet Take 1 tablet (10 mg total) by mouth at bedtime as needed for sleep. 12/28/13  Yes Tiffany L Reed, DO  amitriptyline (ELAVIL) 50 MG tablet Take 1 tablet (50 mg total) by mouth at bedtime. Patient not taking: Reported on 08/06/2014 03/28/13   Blanchie Serve, MD  HYDROcodone-acetaminophen (NORCO/VICODIN) 5-325 MG per tablet Take 1 tablet by mouth every 4 (four) hours as needed for moderate pain.  04/15/14   Historical Provider, MD  oseltamivir (TAMIFLU) 30 MG capsule Take 1 capsule (30 mg total) by mouth daily. Only take medication AFTER dialysis x 5  doses Patient not taking: Reported on 08/06/2014 07/12/14   Gildardo Cranker, DO  oxyCODONE-acetaminophen (PERCOCET/ROXICET) 5-325 MG per tablet Take 1-2 tablets by mouth every 4 (four) hours as needed for severe pain.  06/27/14   Historical Provider, MD  sildenafil (VIAGRA) 25 MG tablet Take 2 tablets (50 mg total) by mouth daily as needed for erectile dysfunction. 06/27/13   Blanchie Serve, MD   Liver Function Tests  Recent Labs Lab 08/07/14 1515  AST 15  ALT 12*  ALKPHOS 71  BILITOT 0.9  PROT 7.6  ALBUMIN 3.1*   No results for input(s): LIPASE, AMYLASE in the last 168 hours. CBC  Recent Labs Lab 08/07/14 1515 08/08/14 0500  WBC 8.7  8.4  NEUTROABS  --  4.4  HGB 10.6* 8.8*  HCT 34.8* 28.6*  MCV 87.4 88.8  PLT 274 [id number]   Basic Metabolic Panel  Recent Labs Lab 08/07/14 1515  NA 136  K 5.2*  CL 102  CO2 22  GLUCOSE 69  BUN 42*  CREATININE 6.63*  CALCIUM 9.0    Filed Vitals:   08/07/14 2010 08/08/14 0018 08/08/14 0515 08/08/14 0543  BP: 121/79 121/69 139/72 128/67  Pulse: 91 91 103 93  Temp: 98.5 F (36.9 C) 98.8 F (37.1 C) 98.6 F (37 C)   TempSrc:   Oral   Resp: 16 16  13   Height:      Weight:      SpO2: 100% 94% 95% 99%   Exam Alert, no distress, calm No rash, cyanosis or gangrene Sclera anicteric, throat clear No jvd Chext clear bilat  RRR no RMG Abd soft ntnd no mass or ascites GU normal Left leg wrapped, no LE edema left or right leg Neuro is alert, ox 3, nf LUA AVF +bruit   [facility] MWF  88kg  8hr  Nocturnal  Heparin   Assessment: 1. S/P TKR removal/ placement beads 2. ESRD on HD 3. Vol excess - sob, ^5kg, not in distress 4. HTN on norvasc 5. DM on oral agents 6. Anemia Hb 10 > 8.8, get OP records   Plan- HD today, max UF, HD Friday to get back on schedule  Kelly Splinter MD (pgr) [phone]    ([phone] 08/08/2014, 9:24 AM

## 2014-08-08 NOTE — Progress Notes (Signed)
ANTIBIOTIC CONSULT NOTE - INITIAL  Pharmacy Consult for Vancomycin Indication: infected TKA  Allergies  Allergen Reactions  . Morphine And Related Other (See Comments)    hallucinations  . Tygacil [Tigecycline] Other (See Comments)    Makes him feel crazy    Patient Measurements: Height: 5\' 11"  (180.3 cm) Weight: 196 lb (88.905 kg) IBW/kg (Calculated) : 75.3  Vital Signs: Temp: 98.6 F (37 C) (07/28 0515) Temp Source: Oral (07/28 0515) BP: 128/67 mmHg (07/28 0543) Pulse Rate: 93 (07/28 0543) Intake/Output from previous day: 07/27 0701 - 07/28 0700 In: 750 [I.V.:750] Out: 300 [Blood:300] Intake/Output from this shift:    Labs:  Recent Labs  08/07/14 1515 08/08/14 0500  WBC 8.7 8.4  HGB 10.6* 8.8*  PLT 274 225  CREATININE 6.63*  --    Estimated Creatinine Clearance: 13.4 mL/min (by C-G formula based on Cr of 6.63). No results for input(s): VANCOTROUGH, VANCOPEAK, VANCORANDOM, GENTTROUGH, GENTPEAK, GENTRANDOM, TOBRATROUGH, TOBRAPEAK, TOBRARND, AMIKACINPEAK, AMIKACINTROU, AMIKACIN in the last 72 hours.   Microbiology: Recent Results (from the past 720 hour(s))  Blood culture (routine x 2)     Status: None   Collection Time: 07/13/14  9:03 PM  Result Value Ref Range Status   Specimen Description BLOOD RIGHT ARM  Final   Special Requests BOTTLES DRAWN AEROBIC AND ANAEROBIC 5CC EA  Final   Culture NO GROWTH 5 DAYS  Final   Report Status 07/18/2014 FINAL  Final  Blood culture (routine x 2)     Status: None   Collection Time: 07/13/14 11:03 PM  Result Value Ref Range Status   Specimen Description BLOOD RIGHT HAND  Final   Special Requests BOTTLES DRAWN AEROBIC ONLY 10CC  Final   Culture NO GROWTH 5 DAYS  Final   Report Status 07/19/2014 FINAL  Final  MRSA PCR Screening     Status: None   Collection Time: 07/14/14  4:25 AM  Result Value Ref Range Status   MRSA by PCR NEGATIVE NEGATIVE Final    Comment:        The GeneXpert MRSA Assay (FDA approved for NASAL  specimens only), is one component of a comprehensive MRSA colonization surveillance program. It is not intended to diagnose MRSA infection nor to guide or monitor treatment for MRSA infections.   Anaerobic culture     Status: None (Preliminary result)   Collection Time: 08/07/14  5:22 PM  Result Value Ref Range Status   Specimen Description TISSUE LEFT KNEE  Final   Special Requests PATIENT ON FOLLOWING ANCEF  Final   Gram Stain   Final    RARE WBC PRESENT, PREDOMINANTLY PMN NO ORGANISMS SEEN Performed at [facility]    Culture PENDING  Incomplete   Report Status PENDING  Incomplete  Tissue culture     Status: None (Preliminary result)   Collection Time: 08/07/14  5:22 PM  Result Value Ref Range Status   Specimen Description TISSUE LEFT KNEE  Final   Special Requests PATIENT ON FOLLOWING ANCEF  Final   Gram Stain   Final    RARE WBC PRESENT, PREDOMINANTLY PMN NO ORGANISMS SEEN Performed at [facility]    Culture PENDING  Incomplete   Report Status PENDING  Incomplete    Medical History: Past Medical History  Diagnosis Date  . Polymyalgia rheumatica   . Hypertension   . Diabetic neuropathy   . Osteomyelitis of foot, left, acute   . Anxiety   . Insomnia, unspecified   . Unspecified vitamin  D deficiency   . Anemia, unspecified   . Other chronic postoperative pain   . Unspecified hereditary and idiopathic peripheral neuropathy   . Allergy   . Unspecified osteomyelitis, site unspecified   . Long term (current) use of anticoagulants   . 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 Ulcer     diabetic foot   . Arthralgia 2010    polyarticular  . Hemorrhoids, internal 10/2011    small  . CHF (congestive heart failure)   . CHF (congestive heart failure) 07/25/2009    denies  . Hemodialysis access site with mature fistula   . Myocardial infarction 1995  . Pneumonia     "probably 4-5 times" (05/09/2014)  . Sleep apnea     "lost weight; no more  problem" (05/09/2014)  . Type II diabetes mellitus dx'd 1995  . History of blood transfusion     "related to the anemia"  . GERD (gastroesophageal reflux disease)     hx "before I lost weight"  . ESRD (end stage renal disease) on dialysis     started 12/2012; "MWF; [facility]"  . Stroke 01/10/06    denies residual on 05/09/2014  . Arthritis     "back, knees" (05/09/2014)  . Chronic lower back pain     Medications:  Prescriptions prior to admission  Medication Sig Dispense Refill Last Dose  . amLODipine (NORVASC) 5 MG tablet Take 5 mg by mouth daily.   6 08/06/2014 at Unknown time  . Cholecalciferol (VITAMIN D PO) Take 1 tablet by mouth every Monday, Wednesday, and Friday. Take at dialysis   Past Week at Unknown time  . gabapentin (NEURONTIN) 100 MG capsule Take 400 mg by mouth at bedtime.  360 capsule 3 08/06/2014 at Unknown time  . glipiZIDE (GLUCOTROL) 5 MG tablet Take 1 tablet (5 mg total) by mouth daily before breakfast. 180 tablet 3 08/06/2014 at Unknown time  . ibuprofen (ADVIL,MOTRIN) 200 MG tablet Take 200 mg by mouth daily as needed (pain).   08/06/2014 at Unknown time  . zolpidem (AMBIEN) 10 MG tablet Take 1 tablet (10 mg total) by mouth at bedtime as needed for sleep. 90 tablet 1 08/06/2014 at Unknown time  . amitriptyline (ELAVIL) 50 MG tablet Take 1 tablet (50 mg total) by mouth at bedtime. (Patient not taking: Reported on 08/06/2014) 90 tablet 3 Not Taking at Unknown time  . HYDROcodone-acetaminophen (NORCO/VICODIN) 5-325 MG per tablet Take 1 tablet by mouth every 4 (four) hours as needed for moderate pain.   0 Not Taking at Unknown time  . oseltamivir (TAMIFLU) 30 MG capsule Take 1 capsule (30 mg total) by mouth daily. Only take medication AFTER dialysis x 5 doses (Patient not taking: Reported on 08/06/2014) 5 capsule 0 Not Taking at Unknown time  . oxyCODONE-acetaminophen (PERCOCET/ROXICET) 5-325 MG per tablet Take 1-2 tablets by mouth every 4 (four) hours as needed for severe  pain.   0 Not Taking at Unknown time  . sildenafil (VIAGRA) 25 MG tablet Take 2 tablets (50 mg total) by mouth daily as needed for erectile dysfunction. 10 tablet 3 More than a month at Unknown time   Assessment: 54 y.o. male presented with L knee pain. Pt with septic knee who has failed non-surgical conservative treatments for greater than 12 weeks (including IV abx and placement of abx beads). S/p TKA revision/abx spacer 7/27. Noted pt is ESRD with M/W/F HD as o/p. Noted that ID has been consulted.  Per patient, he  received Tressie Ellis and Vancomycin at his last HD - prior to that he had been off of the IV abx for at least a week. Pt received Ancef pre-op.  Goal of Therapy:  Vanc pre-HD level 15-25 mcg/ml  Plan:  Vancomycin random level to help determine Vancomycin dosing  Sherlon Handing, PharmD, BCPS Clinical pharmacist, pager [phone] 08/08/2014,7:01 AM

## 2014-08-08 NOTE — Anesthesia Postprocedure Evaluation (Signed)
Anesthesia Post Note  Patient: Matthew Kline  Procedure(s) Performed: Procedure(s) (LRB): Replace Left Total Knee Arthroplasty,  Place Antibiotic Spacer (Left)  Anesthesia type: General  Patient location: PACU  Post pain: Pain level controlled  Post assessment: Post-op Vital signs reviewed  Last Vitals: BP 128/67 mmHg  Pulse 93  Temp(Src) 37 C (Oral)  Resp 13  Ht 5\' 11"  (1.803 m)  Wt 196 lb (88.905 kg)  BMI 27.35 kg/m2  SpO2 99%  Post vital signs: Reviewed  Level of consciousness: sedated  Complications: No apparent anesthesia complications

## 2014-08-08 NOTE — Progress Notes (Signed)
Utilization review completed.  

## 2014-08-08 NOTE — Evaluation (Signed)
Physical Therapy Evaluation Patient Details Name: Matthew Kline MRN: [id number] DOB: January 12, 1961 Today's Date: 08/08/2014   History of Present Illness  removal LT total knee arthroplasty and placement of antibiotics spacer  Clinical Impression  Pt is s/p TKA hardware removal resulting in the deficits listed below (see PT Problem List). Pt will benefit from skilled PT to increase their independence and safety with mobility to allow discharge to home with family assistance.      Follow Up Recommendations Home health PT    Equipment Recommendations  None recommended by PT (patient reports having all equipment already)    Recommendations for Other Services       Precautions / Restrictions Precautions Precautions: Knee;Fall Required Braces or Orthoses: Knee Immobilizer - Left Knee Immobilizer - Left: On when out of bed or walking Restrictions Weight Bearing Restrictions: Yes LLE Weight Bearing: Weight bearing as tolerated      Mobility  Bed Mobility Overal bed mobility: Needs Assistance Bed Mobility: Sit to Supine;Supine to Sit     Supine to sit: Min assist Sit to supine: Min guard      Transfers Overall transfer level: Needs assistance Equipment used: Rolling walker (2 wheeled) Transfers: Sit to/from Stand Sit to Stand: Min guard         General transfer comment: reminders for hand placements  Ambulation/Gait Ambulation/Gait assistance: Min guard Ambulation Distance (Feet): 15 Feet Assistive device: Rolling walker (2 wheeled) Gait Pattern/deviations: Decreased step length - left;Decreased stance time - left;Decreased weight shift to left;Step-to pattern     General Gait Details: slow pattern  Stairs            Wheelchair Mobility    Modified Rankin (Stroke Patients Only)       Balance Overall balance assessment: Needs assistance Sitting-balance support: No upper extremity supported Sitting balance-Leahy Scale: Fair     Standing balance  support: Bilateral upper extremity supported Standing balance-Leahy Scale: Poor                               Pertinent Vitals/Pain Pain Assessment: 0-10 Pain Score: 7  Pain Location: Lt knee Pain Descriptors / Indicators: Aching (cracking) Pain Intervention(s): Monitored during session    Home Living Family/patient expects to be discharged to:: Private residence Living Arrangements: Spouse/significant other Available Help at Discharge: Family Type of Home: House Home Access: Stairs to enter   Technical brewer of Steps: 3 Home Layout: One level Home Equipment: Environmental consultant - 2 wheels      Prior Function Level of Independence: Independent               Hand Dominance        Extremity/Trunk Assessment               Lower Extremity Assessment: Overall WFL for tasks assessed;LLE deficits/detail   LLE Deficits / Details: unable to perform SLR     Communication   Communication: No difficulties  Cognition Arousal/Alertness: Awake/alert Behavior During Therapy: WFL for tasks assessed/performed Overall Cognitive Status: Within Functional Limits for tasks assessed                      General Comments      Exercises        Assessment/Plan    PT Assessment Patient needs continued PT services  PT Diagnosis Difficulty walking;Abnormality of gait;Generalized weakness   PT Problem List Decreased strength;Decreased range of motion;Decreased activity tolerance;Decreased balance;Decreased mobility  PT Treatment Interventions DME instruction;Gait training;Stair training;Functional mobility training;Therapeutic activities;Therapeutic exercise;Balance training;Patient/family education   PT Goals (Current goals can be found in the Care Plan section) Acute Rehab PT Goals Patient Stated Goal: go back home for more therapy PT Goal Formulation: With patient Time For Goal Achievement: 08/22/14 Potential to Achieve Goals: Good    Frequency  7X/week   Barriers to discharge        Co-evaluation               End of Session Equipment Utilized During Treatment: Gait belt;Left knee immobilizer   Patient left: in bed;with call bell/phone within reach;with SCD's reapplied (Patient refusing to attempt sitting in chair) Nurse Communication: Mobility status         Time: [id number] PT Time Calculation (min) (ACUTE ONLY): 32 min   Charges:   PT Evaluation $Initial PT Evaluation Tier I: 1 Procedure PT Treatments $Gait Training: 8-22 mins   PT G Codes:        Cassell Clement, PT, CSCS Pager [phone] Office [phone]  08/08/2014, 11:01 AM

## 2014-08-08 NOTE — Progress Notes (Signed)
[name], male   DOB: [birth year], 54 y.o.   MRN: [id number] Postoperative day 1 removal total knee arthroplasty and placement of antibiotics spacer. We will hold on placement of a central line to see if we can provide his antibiotics through his dialysis. Plan for dialysis today. Cultures pending. We will request infectious disease consultation.

## 2014-08-08 NOTE — Progress Notes (Addendum)
ANTIBIOTIC CONSULT NOTE - FOLLOW UP  Pharmacy Consult for Vancomycin and Ceftazidime Indication: infected TKA  Allergies  Allergen Reactions  . Morphine And Related Other (See Comments)    hallucinations  . Tygacil [Tigecycline] Other (See Comments)    Makes him feel crazy    Patient Measurements: Height: _0  (180.3 cm) Weight: 189 lb 9.5 oz (86 kg) IBW/kg (Calculated) : 75.3  Vital Signs: Temp: 100.2 F (37.9 C) (07/28 1448) Temp Source: Oral (07/28 1448) BP: 119/66 mmHg (07/28 1448) Pulse Rate: 116 (07/28 1448)  Labs:  Recent Labs  08/07/14 1515 08/08/14 0500 08/08/14 1015  WBC 8.7 8.4  --   HGB 10.6* 8.8*  --   PLT 274 225  --   CREATININE 6.63*  --  7.29*   Estimated Creatinine Clearance: 12.2 mL/min (by C-G formula based on Cr of 7.29).  Recent Labs  08/08/14 0809  VANCORANDOM 17    Assessment:   Day # 2 Vancomycin for infected TKA. POD# 1 removal of prosthetic joint/placement of antibiotic spacer. Had received Vanc and Fortaz at [facility] with last HD prior to admission.  Random vanc level this morning = 17 mcg/ml, and Vanc 1 gram x 1 given.  Usual MWF nocturnal HD noted, and HD planned again tomorrow (Fri) to get back on schedule.   Wound culture from OR pending. No organisms on gram stain.   Tmax 100.2, WBC 8.4, ESR 75.   ID to see.  Goal of Therapy:  pre-dialysis Vancomycin levels 15-25 mcg/ml  Plan:   Vancomycin 1 gram IV x 1 given after HD today.  Plan Vanc 1 gram after each HD; will order after HD orders placed.  Follow up culture results, ID consult.  Arty Baumgartner, [place] Pager: [phone] 08/08/2014,3:12 PM  ADDN: Pharmacy is consulted to dose ceftazidime for infected TKA. Pt will continue vancomycin.   Plan: Ceftazidime 2g IV qHD  Andrey Cota. Diona Foley, PharmD Clinical Pharmacist Pager [phone]

## 2014-08-08 NOTE — Procedures (Signed)
  I was present at this dialysis session, have reviewed the session itself and made  appropriate changes Rob Marilea Gwynne MD (pgr) [phone]    (c) [phone] 06/29/2014, 10:31 AM    

## 2014-08-08 NOTE — Consult Note (Signed)
Matthew Kline for Infectious Disease    Date of Admission:  08/07/2014   Total days of antibiotics 2        Day 2 Vanc        1 Day Cefazolin (perioperative)               Reason for Consult: Prosthetic Left Knee Infection    Referring Physician: Dr. Meridee Score  Active Problems:   Septic joint of left knee joint   . amLODipine  5 mg Oral Daily  . aspirin EC  325 mg Oral Daily  . gabapentin  400 mg Oral QHS  . glipiZIDE  5 mg Oral QAC breakfast  . insulin aspart  0-9 Units Subcutaneous TID WC    Recommendations: 1. Continue IV Vanc 2. Start IV ceftazidime, both after dialysis 3. Will follow up aerboic, anaerobic, and AFB cultures and narrow as appropriate 4. Plan for 6 weeks IV antibiotic treatment  Assessment: Matthew Kline is a 54 year old man with a pmh significant for HTN, DM, ESRD w/ HD MWF nocturnal who presents with total left knee replacement in 2 stage surgery for a left knee prosthetic joint infection. Patient has normal WBC, but has been tachycardic, febrile to 101, had chills, rigors, and been hypotensive at times. Cultures are pending but patient will be empirically treated until culture speciates out with sensitivities.   Plan is for 6 weeks IV antibiotic treatment. Ceftazidime and Vancomycin can both be given with dialysis.  ADDENDUM: agree with above.  Would use vancomycin and ceftazidime which can be given with dialysis for 6 weeks.  I have added ceftazidime. Will follow cultures and narrow as appropriate.   Thanks for consult  COMER, ROBERT, MD   HPI: Matthew Kline is a 54 y.o. male who presents with a prosthetic knee joint infection who failed IV antibiotic treatment as an outpatient and now has undergone the first surgery in 2 stage total knee replacement. Patient gives history and is notably drowsy and has difficulty giving history. 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 He reports he has  Had multiple knee operations due to a torn meniscus prior to knee replacement. Patient saw his orthopedist back near April and they were concerned for a joint infection given the swelling around his knee. He reports he had no erythema or fevers at the time and began 12 weeks of IV antibiotic therapy. When this failed to appropriately treat his knee, he was scheduled for 2 stage therapy. Since his operation, he has been febrile, had chills, diaphoresis, rigors, and had increased pain and swelling associated with his knee. He reports swelling that extends from his knee to upper thing and down to his foot. He denies any redness or rashes. Does endorse some warmth over his left leg.   Review of Systems: A comprehensive review of systems was negative except for: Constitutional: positive for chills, fatigue, fevers and sweats Musculoskeletal: positive for left knee pain and swelling  Past Medical History  Diagnosis Date  . Polymyalgia rheumatica   . Hypertension   . Diabetic neuropathy   . Osteomyelitis of foot, left, acute   . Anxiety   . Insomnia, unspecified   . Unspecified vitamin D deficiency   . Anemia, unspecified   . Other chronic postoperative pain   . Unspecified hereditary and idiopathic peripheral neuropathy   . Allergy   .  Unspecified osteomyelitis, site unspecified   . Long term (current) use of anticoagulants   . 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 Ulcer     diabetic foot   . Arthralgia 2010    polyarticular  . Hemorrhoids, internal 10/2011    small  . CHF (congestive heart failure)   . CHF (congestive heart failure) 07/25/2009    denies  . Hemodialysis access site with mature fistula   . Myocardial infarction 1995  . Pneumonia     "probably 4-5 times" (05/09/2014)  . Sleep apnea     "lost weight; no more problem" (05/09/2014)  . Type II diabetes mellitus dx'd 1995  . History of blood transfusion     "related to the anemia"  .  GERD (gastroesophageal reflux disease)     hx "before I lost weight"  . ESRD (end stage renal disease) on dialysis     started 12/2012; "MWF; [facility]"  . Stroke 01/10/06    denies residual on 05/09/2014  . Arthritis     "back, knees" (05/09/2014)  . Chronic lower back pain     History  Substance Use Topics  . Smoking status: Current Some Day Smoker -- 0.12 packs/day for 32 years    Types: Cigarettes  . Smokeless tobacco: Never Used     Comment: 3 -4 cig daily   . Alcohol Use: No    Family History  Problem Relation Age of Onset  . Hypertension Mother   . Cancer Mother 7    Ovarian  . Heart disease Maternal Aunt   . Stroke Maternal Grandfather    Allergies  Allergen Reactions  . Morphine And Related Other (See Comments)    hallucinations  . Tygacil [Tigecycline] Other (See Comments)    Makes him feel crazy    OBJECTIVE: Blood pressure 145/71, pulse 122, temperature 101.2 F (38.4 C), temperature source Oral, resp. rate 18, height 5\' 11"  (1.803 m), weight 86 kg (189 lb 9.5 oz), SpO2 95 %. General: NAd, drowsy but arousable Skin: No evidence of rash or redness Lungs: CTAB Cor: Tachycardic, reg rhythm, no M Abdomen: +bs, nontender, nondistender Some non pitting edema extending from knee to upper thigh and left foot, right leg normal, no evidence of erythema some warmth to touch  Lab Results Lab Results  Component Value Date   WBC 8.4 08/08/2014   HGB 8.8* 08/08/2014   HCT 28.6* 08/08/2014   MCV 88.8 08/08/2014   PLT 225 08/08/2014    Lab Results  Component Value Date   CREATININE 7.29* 08/08/2014   BUN 47* 08/08/2014   NA 134* 08/08/2014   K 5.7* 08/08/2014   CL 101 08/08/2014   CO2 23 08/08/2014    Lab Results  Component Value Date   ALT 12* 08/07/2014   AST 15 08/07/2014   ALKPHOS 71 08/07/2014   BILITOT 0.9 08/07/2014     Microbiology: Recent Results (from the past 240 hour(s))  Anaerobic culture     Status: None (Preliminary result)    Collection Time: 08/07/14  5:22 PM  Result Value Ref Range Status   Specimen Description TISSUE LEFT KNEE  Final   Special Requests PATIENT ON FOLLOWING ANCEF  Final   Gram Stain   Final    RARE WBC PRESENT, PREDOMINANTLY PMN NO ORGANISMS SEEN Performed at [facility]    Culture   Final    NO ANAEROBES ISOLATED; CULTURE IN PROGRESS FOR 5 DAYS Performed at [facility]  Report Status PENDING  Incomplete  Fungus Culture with Smear     Status: None (Preliminary result)   Collection Time: 08/07/14  5:22 PM  Result Value Ref Range Status   Specimen Description TISSUE LEFT KNEE  Final   Special Requests PATIENT ON FOLLOWING ANCEF  Final   Fungal Smear   Final    NO YEAST OR FUNGAL ELEMENTS SEEN Performed at [facility]    Culture   Final    CULTURE IN PROGRESS FOR FOUR WEEKS Performed at [facility]    Report Status PENDING  Incomplete  Tissue culture     Status: None (Preliminary result)   Collection Time: 08/07/14  5:22 PM  Result Value Ref Range Status   Specimen Description TISSUE LEFT KNEE  Final   Special Requests PATIENT ON FOLLOWING ANCEF  Final   Gram Stain   Final    RARE WBC PRESENT, PREDOMINANTLY PMN NO ORGANISMS SEEN Performed at [facility]    Culture PENDING  Incomplete   Report Status PENDING  Incomplete  AFB culture with smear     Status: None (Preliminary result)   Collection Time: 08/07/14  5:22 PM  Result Value Ref Range Status   Specimen Description TISSUE LEFT KNEE  Final   Special Requests PATIENT ON FOLLOWING ANCEF  Final   Acid Fast Smear   Final    NO ACID FAST BACILLI SEEN Performed at [facility]    Culture   Final    CULTURE WILL BE EXAMINED FOR 6 WEEKS BEFORE ISSUING A FINAL REPORT Performed at [facility]    Report Status PENDING  Incomplete    Loleta Chance, [place] [facility] 08/08/2014, 4:57 PM

## 2014-08-09 LAB — GLUCOSE, CAPILLARY
GLUCOSE-CAPILLARY: 111 mg/dL — AB (ref 65–99)
GLUCOSE-CAPILLARY: 217 mg/dL — AB (ref 65–99)
GLUCOSE-CAPILLARY: 85 mg/dL (ref 65–99)
Glucose-Capillary: 115 mg/dL — ABNORMAL HIGH (ref 65–99)

## 2014-08-09 LAB — CBC WITH DIFFERENTIAL/PLATELET
BASOS PCT: 0 % (ref 0–1)
Basophils Absolute: 0 10*3/uL (ref 0.0–0.1)
Eosinophils Absolute: 0.1 10*3/uL (ref 0.0–0.7)
Eosinophils Relative: 1 % (ref 0–5)
HCT: 28.4 % — ABNORMAL LOW (ref 39.0–52.0)
Hemoglobin: 8.7 g/dL — ABNORMAL LOW (ref 13.0–17.0)
LYMPHS ABS: 2.5 10*3/uL (ref 0.7–4.0)
LYMPHS PCT: 25 % (ref 12–46)
MCH: 27 pg (ref 26.0–34.0)
MCHC: 30.6 g/dL (ref 30.0–36.0)
MCV: 88.2 fL (ref 78.0–100.0)
Monocytes Absolute: 1.6 10*3/uL — ABNORMAL HIGH (ref 0.1–1.0)
Monocytes Relative: 16 % — ABNORMAL HIGH (ref 3–12)
NEUTROS ABS: 5.7 10*3/uL (ref 1.7–7.7)
Neutrophils Relative %: 58 % (ref 43–77)
Platelets: 206 10*3/uL (ref 150–400)
RBC: 3.22 MIL/uL — ABNORMAL LOW (ref 4.22–5.81)
RDW: 19.3 % — ABNORMAL HIGH (ref 11.5–15.5)
WBC: 9.9 10*3/uL (ref 4.0–10.5)

## 2014-08-09 LAB — BASIC METABOLIC PANEL
Anion gap: 11 (ref 5–15)
BUN: 30 mg/dL — ABNORMAL HIGH (ref 6–20)
CO2: 27 mmol/L (ref 22–32)
CREATININE: 5.44 mg/dL — AB (ref 0.61–1.24)
Calcium: 8.4 mg/dL — ABNORMAL LOW (ref 8.9–10.3)
Chloride: 95 mmol/L — ABNORMAL LOW (ref 101–111)
GFR, EST AFRICAN AMERICAN: 12 mL/min — AB (ref >60)
GFR, EST NON AFRICAN AMERICAN: 11 mL/min — AB (ref >60)
Glucose, Bld: 173 mg/dL — ABNORMAL HIGH (ref 65–99)
Potassium: 4.4 mmol/L (ref 3.5–5.1)
Sodium: 133 mmol/L — ABNORMAL LOW (ref 135–145)

## 2014-08-09 MED ORDER — VANCOMYCIN HCL IN DEXTROSE 1-5 GM/200ML-% IV SOLN
1000.0000 mg | Freq: Once | INTRAVENOUS | Status: AC
  Administered 2014-08-09: 1000 mg via INTRAVENOUS
  Filled 2014-08-09: qty 200

## 2014-08-09 MED ORDER — HYDROMORPHONE HCL 1 MG/ML IJ SOLN
INTRAMUSCULAR | Status: AC
  Filled 2014-08-09: qty 1

## 2014-08-09 MED ORDER — GABAPENTIN 300 MG PO CAPS
300.0000 mg | ORAL_CAPSULE | Freq: Three times a day (TID) | ORAL | Status: DC
  Administered 2014-08-09 – 2014-08-12 (×9): 300 mg via ORAL
  Filled 2014-08-09 (×9): qty 1

## 2014-08-09 MED ORDER — DEXTROSE 5 % IV SOLN
2.0000 g | INTRAVENOUS | Status: DC
  Administered 2014-08-12: 2 g via INTRAVENOUS
  Filled 2014-08-09 (×2): qty 2

## 2014-08-09 MED ORDER — VANCOMYCIN HCL IN DEXTROSE 750-5 MG/150ML-% IV SOLN: 750.0000 mg | INTRAVENOUS | Status: DC

## 2014-08-09 MED ORDER — TEMAZEPAM 15 MG PO CAPS: 30.0000 mg | ORAL_CAPSULE | Freq: Every evening | ORAL | Status: DC | PRN

## 2014-08-09 MED ORDER — DEXTROSE 5 % IV SOLN
2.0000 g | Freq: Once | INTRAVENOUS | Status: AC
  Administered 2014-08-09: 2 g via INTRAVENOUS
  Filled 2014-08-09: qty 2

## 2014-08-09 MED ORDER — VANCOMYCIN HCL IN DEXTROSE 1-5 GM/200ML-% IV SOLN
1000.0000 mg | INTRAVENOUS | Status: DC
  Administered 2014-08-12: 1000 mg via INTRAVENOUS
  Filled 2014-08-09 (×2): qty 200

## 2014-08-09 MED ORDER — TAMSULOSIN HCL 0.4 MG PO CAPS
0.4000 mg | ORAL_CAPSULE | Freq: Every day | ORAL | Status: DC
  Administered 2014-08-09 – 2014-08-12 (×4): 0.4 mg via ORAL
  Filled 2014-08-09 (×4): qty 1

## 2014-08-09 NOTE — Progress Notes (Addendum)
[facility] Progress Note   Subjective: Complaining that pain meds were stopped. No other issues. Was shaky after HD last night with 5kg. Also per RN was sedated yesterday so they''ve been holding pain meds until he is more alert. He is alert today.   Filed Vitals:   08/09/14 0848 08/09/14 0853 08/09/14 0930 08/09/14 1000  BP: 105/55 94/46 107/60 105/50  Pulse: 88 85 87 89  Temp:      TempSrc:      Resp: 15 14 21 23   Height:      Weight:      SpO2:       Exam: Alert, no distress, calm No jvd Chext clear bilat  RRR no RMG Abd soft ntnd no mass or ascites GU normal Left leg wrapped, no LE edema left or right leg Neuro is alert, ox 3, nf LUA AVF +bruit   [facility] MWF 88kg 8hr Nocturnal Heparin   Assessment: 1. S/P TKR removal/ placement AB beads. Per ID plan 6 weeks vanc/fortaz with HD. Culture results pending.  2. ESRD on HD 3. Volume - at dry wt today, stable 4. HTN bp's soft, hold norvasc 5. DM on oral agents 6. Anemia Hb 10 > 8's, get OP records  Plan - HD today, no fluid off, hold bp meds   Kelly Splinter MD pager [phone] cell [phone] 08/09/2014, 10:23 AM    Last Labs      Recent Labs Lab 08/07/14 1515 08/08/14 1015 08/09/14 0900  NA 136 134* 133*  K 5.2* 5.7* 4.4  CL 102 101 95*  CO2 22 23 27   GLUCOSE 69 109* 173*  BUN 42* 47* 30*  CREATININE 6.63* 7.29* 5.44*  CALCIUM 9.0 8.5* 8.4*  PHOS --  6.5* --       Last Labs      Recent Labs Lab 08/07/14 1515 08/08/14 1015  AST 15 --   ALT 12* --   ALKPHOS 71 --   BILITOT 0.9 --   PROT 7.6 --   ALBUMIN 3.1* 2.4*      Last Labs      Recent Labs Lab 08/07/14 1515 08/08/14 0500 08/09/14 0403  WBC 8.7 8.4 9.9  NEUTROABS --  4.4 5.7  HGB 10.6* 8.8* 8.7*  HCT 34.8* 28.6* 28.4*  MCV 87.4 88.8 88.2  PLT 274 225 206      . amLODipine 5 mg Oral Daily  . aspirin EC 325 mg Oral Daily  . gabapentin 300 mg Oral TID  . glipiZIDE 5 mg Oral QAC breakfast  . insulin aspart 0-9 Units Subcutaneous TID WC  . sodium chloride 250 mL Intravenous Once  . tamsulosin 0.4 mg Oral Daily   . sodium chloride    acetaminophen **OR** acetaminophen, bisacodyl, HYDROmorphone (DILAUDID) injection, methocarbamol **OR** methocarbamol (ROBAXIN) IV, metoCLOPramide **OR** metoCLOPramide (REGLAN) injection, ondansetron **OR** ondansetron (ZOFRAN) IV, oxyCODONE, temazepam

## 2014-08-09 NOTE — Progress Notes (Signed)
PT Cancellation Note  Patient Details Name: Matthew Kline MRN: [id number] DOB: 02/24/1960   Cancelled Treatment:    Reason Eval/Treat Not Completed: Patient at procedure or test/unavailable. Will check back as able.    Cassell Clement, PT, CSCS Pager [phone] Office [phone]  08/09/2014, 9:35 AM

## 2014-08-09 NOTE — Progress Notes (Signed)
[name], male   DOB: [birth year], 54 y.o.   MRN: [id number] Thank you for infectious disease consult. Final cultures pending. Patient complains of pain in his Neurontin was increased to 300 mg 3 times a day prescription written for Restoril to help with sleep. vancomycin and Fortaz after dialysis. Anticipate discharge to home on Monday at this time cultures should be finalized.

## 2014-08-09 NOTE — Progress Notes (Signed)
ANTIBIOTIC CONSULT NOTE - FOLLOW UP  Pharmacy Consult:  Vancomycin / Tressie Ellis Indication:  Infected TKA  Allergies  Allergen Reactions  . Morphine And Related Other (See Comments)    hallucinations  . Tygacil [Tigecycline] Other (See Comments)    Makes him feel crazy    Patient Measurements: Height: 5\' 11"  (180.3 cm) Weight: 194 lb 0.1 oz (88 kg) IBW/kg (Calculated) : 75.3  Vital Signs: Temp: 98.2 F (36.8 C) (07/29 1202) Temp Source: Oral (07/29 1202) BP: 108/61 mmHg (07/29 1202) Pulse Rate: 92 (07/29 1202) Intake/Output from previous day: 07/28 0701 - 07/29 0700 In: 120 [P.O.:120] Out: 6000 [Urine:1000] Intake/Output from this shift: Total I/O In: 75 [P.O.:75] Out: -65   Labs:  Recent Labs  08/07/14 1515 08/08/14 0500 08/08/14 1015 08/09/14 0403 08/09/14 0900  WBC 8.7 8.4  --  9.9  --   HGB 10.6* 8.8*  --  8.7*  --   PLT 274 225  --  206  --   CREATININE 6.63*  --  7.29*  --  5.44*   Estimated Creatinine Clearance: 16.3 mL/min (by C-G formula based on Cr of 5.44).  Recent Labs  08/08/14 0809  [facility] 9     Microbiology: Recent Results (from the past 720 hour(s))  Blood culture (routine x 2)     Status: None   Collection Time: 07/13/14  9:03 PM  Result Value Ref Range Status   Specimen Description BLOOD RIGHT ARM  Final   Special Requests BOTTLES DRAWN AEROBIC AND ANAEROBIC 5CC EA  Final   Culture NO GROWTH 5 DAYS  Final   Report Status 07/18/2014 FINAL  Final  Blood culture (routine x 2)     Status: None   Collection Time: 07/13/14 11:03 PM  Result Value Ref Range Status   Specimen Description BLOOD RIGHT HAND  Final   Special Requests BOTTLES DRAWN AEROBIC ONLY 10CC  Final   Culture NO GROWTH 5 DAYS  Final   Report Status 07/19/2014 FINAL  Final  MRSA PCR Screening     Status: None   Collection Time: 07/14/14  4:25 AM  Result Value Ref Range Status   MRSA by PCR NEGATIVE NEGATIVE Final    Comment:        The GeneXpert MRSA Assay  (FDA approved for NASAL specimens only), is one component of a comprehensive MRSA colonization surveillance program. It is not intended to diagnose MRSA infection nor to guide or monitor treatment for MRSA infections.   Anaerobic culture     Status: None (Preliminary result)   Collection Time: 08/07/14  5:22 PM  Result Value Ref Range Status   Specimen Description TISSUE LEFT KNEE  Final   Special Requests PATIENT ON FOLLOWING ANCEF  Final   Gram Stain   Final    RARE WBC PRESENT, PREDOMINANTLY PMN NO ORGANISMS SEEN Performed at [facility]    Culture   Final    NO ANAEROBES ISOLATED; CULTURE IN PROGRESS FOR 5 DAYS Performed at [facility]    Report Status PENDING  Incomplete  Fungus Culture with Smear     Status: None (Preliminary result)   Collection Time: 08/07/14  5:22 PM  Result Value Ref Range Status   Specimen Description TISSUE LEFT KNEE  Final   Special Requests PATIENT ON FOLLOWING ANCEF  Final   Fungal Smear   Final    NO YEAST OR FUNGAL ELEMENTS SEEN Performed at [facility]    Culture   Final  CULTURE IN PROGRESS FOR FOUR WEEKS Performed at [facility]    Report Status PENDING  Incomplete  Tissue culture     Status: None (Preliminary result)   Collection Time: 08/07/14  5:22 PM  Result Value Ref Range Status   Specimen Description TISSUE LEFT KNEE  Final   Special Requests PATIENT ON FOLLOWING ANCEF  Final   Gram Stain   Final    RARE WBC PRESENT, PREDOMINANTLY PMN NO ORGANISMS SEEN Performed at [facility]    Culture   Final    NO GROWTH 1 DAY Performed at [facility]    Report Status PENDING  Incomplete  AFB culture with smear     Status: None (Preliminary result)   Collection Time: 08/07/14  5:22 PM  Result Value Ref Range Status   Specimen Description TISSUE LEFT KNEE  Final   Special Requests PATIENT ON FOLLOWING ANCEF  Final   Acid Fast Smear   Final    NO ACID FAST BACILLI  SEEN Performed at [facility]    Culture   Final    CULTURE WILL BE EXAMINED FOR 6 WEEKS BEFORE ISSUING A FINAL REPORT Performed at [facility]    Report Status PENDING  Incomplete      Assessment: 39 YOM with infected TKA to 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  He is s/p TKA revision and antibiotic spacer on 08/07/14.  Patient resumed his usually HD schedule and is s/p HD today.  Vanc 7/27 >> Ancef 7/27 >> 7/28 Fortaz PTA >>  7/28 VR = 17 mcg/mL      7/27 left knee tissue for AFB - pending   7/27 left knee tissue, anaerobic - pending   7/27 left knee tissue fungal cx - pending   7/27 left knee tissue - pending   ** 6/5 left knee tissue - moderate Enterococcus (MIC Vanc = 1, also sens to Amp)   Goal of Therapy:  Vanc pre-HD level:  15-25 mcg/mL   Plan:  - Vanc 1gm IV x 1 now, then 1gm IV q-HD MWF - Tressie Ellis 2gm IV x 1 now, then q-HD MWF - Monitor HD schedule, micro data, vanc level as indicated    Bartholomew Ramesh D. Mina Marble, PharmD, BCPS Pager:  [phone] 08/09/2014, 1:20 PM

## 2014-08-09 NOTE — Care Management Note (Signed)
Case Management Note  Patient Details  Name: Josie Hosick MRN: [id number] Date of Birth: 01/02/1961  Subjective/Objective:                 S/p replacement of left TKA, placement of antibiotics spacers   Action/Plan: Spoke with patient and his wife about HHC, they selected [facility] which patient worked with in the past. Contacted Miranda at [employer] and set up [place]. Patient states that he has a rolling walker and 3N1 at home.   Expected Discharge Date:                  Expected Discharge Plan:  [place]  In-House Referral:  NA  Discharge planning Services  CM Consult  Post Acute Care Choice:  Home Health Choice offered to:  Patient  DME Arranged:    DME Agency:     HH Arranged:  PT [place]:  [place]  Status of Service:  Completed, signed off  Medicare Important Message Given:    Date Medicare IM Given:    Medicare IM give by:    Date Additional Medicare IM Given:    Additional Medicare Important Message give by:     If discussed at [place] of Stay Meetings, dates discussed:    Additional Comments:  Nila Nephew, RN 08/09/2014, 3:09 PM

## 2014-08-10 LAB — CBC WITH DIFFERENTIAL/PLATELET
Basophils Absolute: 0 10*3/uL (ref 0.0–0.1)
Basophils Relative: 0 % (ref 0–1)
EOS ABS: 0.1 10*3/uL (ref 0.0–0.7)
EOS PCT: 1 % (ref 0–5)
HCT: 25.9 % — ABNORMAL LOW (ref 39.0–52.0)
HEMOGLOBIN: 7.9 g/dL — AB (ref 13.0–17.0)
LYMPHS ABS: 2.5 10*3/uL (ref 0.7–4.0)
Lymphocytes Relative: 29 % (ref 12–46)
MCH: 26 pg (ref 26.0–34.0)
MCHC: 30.5 g/dL (ref 30.0–36.0)
MCV: 85.2 fL (ref 78.0–100.0)
MONOS PCT: 12 % (ref 3–12)
Monocytes Absolute: 1 10*3/uL (ref 0.1–1.0)
NEUTROS PCT: 58 % (ref 43–77)
Neutro Abs: 5 10*3/uL (ref 1.7–7.7)
Platelets: 205 10*3/uL (ref 150–400)
RBC: 3.04 MIL/uL — AB (ref 4.22–5.81)
RDW: 18.7 % — ABNORMAL HIGH (ref 11.5–15.5)
WBC: 8.6 10*3/uL (ref 4.0–10.5)

## 2014-08-10 LAB — GLUCOSE, CAPILLARY
GLUCOSE-CAPILLARY: 164 mg/dL — AB (ref 65–99)
Glucose-Capillary: 103 mg/dL — ABNORMAL HIGH (ref 65–99)
Glucose-Capillary: 202 mg/dL — ABNORMAL HIGH (ref 65–99)
Glucose-Capillary: 153 mg/dL — ABNORMAL HIGH (ref 65–99)

## 2014-08-10 MED ORDER — LIDOCAINE HCL (PF) 1 % IJ SOLN
5.0000 mL | INTRAMUSCULAR | Status: DC | PRN
Start: 2014-08-10 — End: 2014-08-11

## 2014-08-10 MED ORDER — SODIUM CHLORIDE 0.9 % IV SOLN: 100.0000 mL | INTRAVENOUS | Status: DC | PRN

## 2014-08-10 MED ORDER — HEPARIN SODIUM (PORCINE) 1000 UNIT/ML DIALYSIS
2000.0000 [IU] | INTRAMUSCULAR | Status: DC | PRN
  Filled 2014-08-10: qty 2

## 2014-08-10 MED ORDER — LIDOCAINE-PRILOCAINE 2.5-2.5 % EX CREA
1.0000 "application " | TOPICAL_CREAM | CUTANEOUS | Status: DC | PRN
  Filled 2014-08-10: qty 5

## 2014-08-10 MED ORDER — NEPRO/CARBSTEADY PO LIQD
237.0000 mL | ORAL | Status: DC | PRN
  Filled 2014-08-10: qty 237

## 2014-08-10 MED ORDER — TRAMADOL HCL 50 MG PO TABS
50.0000 mg | ORAL_TABLET | Freq: Four times a day (QID) | ORAL | Status: DC | PRN
  Administered 2014-08-10 – 2014-08-12 (×6): 50 mg via ORAL
  Filled 2014-08-10 (×5): qty 1

## 2014-08-10 MED ORDER — HEPARIN SODIUM (PORCINE) 1000 UNIT/ML DIALYSIS
1000.0000 [IU] | INTRAMUSCULAR | Status: DC | PRN
  Filled 2014-08-10: qty 1

## 2014-08-10 MED ORDER — PENTAFLUOROPROP-TETRAFLUOROETH EX AERO: 1.0000 "application " | INHALATION_SPRAY | CUTANEOUS | Status: DC | PRN

## 2014-08-10 MED ORDER — DOCUSATE SODIUM 283 MG RE ENEM
1.0000 | ENEMA | Freq: Once | RECTAL | Status: AC
  Administered 2014-08-11: 283 mg via RECTAL
  Filled 2014-08-10: qty 1

## 2014-08-10 MED ORDER — ALTEPLASE 2 MG IJ SOLR
2.0000 mg | Freq: Once | INTRAMUSCULAR | Status: AC | PRN
  Filled 2014-08-10: qty 2

## 2014-08-10 NOTE — Progress Notes (Signed)
Subjective: 3 Days Post-Op Procedure(s) (LRB): Replace Left Total Knee Arthroplasty,  Place Antibiotic Spacer (Left) Patient reports pain as mild to moderate.  States he wants foley cath removed. Has decided to stay off pain meds due to constipation and voiding issues.  Objective: Vital signs in last 24 hours: Temp:  [98 F (36.7 C)-99.7 F (37.6 C)] 98.2 F (36.8 C) (07/30 0540) Pulse Rate:  [88-103] 88 (07/30 0540) Resp:  [17-18] 17 (07/30 0540) BP: (104-131)/(49-61) 115/61 mmHg (07/30 0540) SpO2:  [94 %-96 %] 95 % (07/30 0540) Weight:  [88 kg (194 lb 0.1 oz)] 88 kg (194 lb 0.1 oz) (07/29 1031)  Intake/Output from previous day: 07/29 0701 - 07/30 0700 In: 195 [P.O.:195] Out: 535 [Urine:600] Intake/Output this shift:     Recent Labs  08/07/14 1515 08/08/14 0500 08/09/14 0403 08/10/14 0350  HGB 10.6* 8.8* 8.7* 7.9*    Recent Labs  08/09/14 0403 08/10/14 0350  WBC 9.9 8.6  RBC 3.22* 3.04*  HCT 28.4* 25.9*  PLT 206 205    Recent Labs  08/08/14 1015 08/09/14 0900  NA 134* 133*  K 5.7* 4.4  CL 101 95*  CO2 23 27  BUN 47* 30*  CREATININE 7.29* 5.44*  GLUCOSE 109* 173*  CALCIUM 8.5* 8.4*    Recent Labs  08/07/14 1515  INR 1.11   Left lower extremity: Dorsiflexion/Plantar flexion intact Incision: dressing C/D/I Compartment soft  Assessment/Plan: 3 Days Post-Op Procedure(s) (LRB): Replace Left Total Knee Arthroplasty,  Place Antibiotic Spacer (Left) Up with therapy  Final cultures pending   CLARK, GILBERT 08/10/2014, 10:08 AM

## 2014-08-10 NOTE — Progress Notes (Signed)
Patient refusing suppository at this time- requesting that we wait until after dinner. Will notify night time RN

## 2014-08-10 NOTE — Progress Notes (Signed)
  [facility] Progress Note   Subjective: foley placed for urinary retention, no BM yet  Filed Vitals:   08/09/14 1202 08/09/14 1417 08/09/14 2048 08/10/14 0540  BP: 108/61 122/58 131/56 115/61  Pulse: 92 103 100 88  Temp: 98.2 F (36.8 C) 98.6 F (37 C) 99.7 F (37.6 C) 98.2 F (36.8 C)  TempSrc: Oral Oral Oral Oral  Resp: 18 18 18 17   Height:      Weight:      SpO2: 96% 94% 96% 95%   Exam: Alert, no distress, calm No jvd Chext clear bilat  RRR no RMG Abd soft ntnd no mass or ascites GU normal Left leg wrapped, no LE edema left or right leg Neuro is alert, ox 3, nf LUA AVF +bruit   [facility] MWF 88kg 8hr Nocturnal Heparin   Assessment: 1. S/P TKR removal/ placement AB beads. Per ID plan 6 weeks vanc/fortaz with HD.  Culture results pending.  2. ESRD on HD MWF 3. Volume - at dry wt today, stable 4. HTN bp's soft, holding norvasc 5. DM on oral agents 6. Anemia Hb 10 > 8's, get OP records 7. Urinary retention - dc narcotics, bzd, muscle relaxers and dc foley 8. Constipation - enema after HD today  Plan - HD 2 hrs today to complete yesterday's treatment. Having urinary retention issues, which i will manage this weekend. Plan d/c narcotics/ muscle relaxants, treat pain with tylenol and ultram, advised to decrease caffeine/ fluid intake short-term and to walk a lot in the halls.     Kelly Splinter MD  pager [phone]    cell [phone]  08/10/2014, 10:07 AM     Recent Labs Lab 08/07/14 1515 08/08/14 1015 08/09/14 0900  NA 136 134* 133*  K 5.2* 5.7* 4.4  CL 102 101 95*  CO2 22 23 27   GLUCOSE 69 109* 173*  BUN 42* 47* 30*  CREATININE 6.63* 7.29* 5.44*  CALCIUM 9.0 8.5* 8.4*  PHOS  --  6.5*  --     Recent Labs Lab 08/07/14 1515 08/08/14 1015  AST 15  --   ALT 12*  --   ALKPHOS 71  --   BILITOT 0.9  --   PROT 7.6  --   ALBUMIN 3.1* 2.4*    Recent Labs Lab 08/08/14 0500 08/09/14 0403 08/10/14 0350  WBC 8.4 9.9 8.6  NEUTROABS  4.4 5.7 5.0  HGB 8.8* 8.7* 7.9*  HCT 28.6* 28.4* 25.9*  MCV 88.8 88.2 85.2  PLT 225 206 205   . aspirin EC  325 mg Oral Daily  . [START ON 08/12/2014] cefTAZidime (FORTAZ)  IV  2 g Intravenous Q M,W,F-HD  . docusate sodium  1 enema Rectal Once  . gabapentin  300 mg Oral TID  . glipiZIDE  5 mg Oral QAC breakfast  . insulin aspart  0-9 Units Subcutaneous TID WC  . sodium chloride  250 mL Intravenous Once  . tamsulosin  0.4 mg Oral Daily  . [START ON 08/12/2014] vancomycin  1,000 mg Intravenous Q M,W,F-HD   . sodium chloride     acetaminophen **OR** acetaminophen, bisacodyl, metoCLOPramide **OR** metoCLOPramide (REGLAN) injection, ondansetron **OR** ondansetron (ZOFRAN) IV, traMADol

## 2014-08-10 NOTE — Progress Notes (Signed)
Physical Therapy Treatment Patient Details Name: Matthew Kline MRN: [id number] DOB: 05-16-1960 Today's Date: 08/10/2014    History of Present Illness removal LT total knee arthroplasty and placement of antibiotics spacer    PT Comments    Patient making gains with mobility. Anticipate D/C home with Home Health PT. Patient requiring standing breaks during ambulation and reorientation to task.   Follow Up Recommendations  Home health PT     Equipment Recommendations  None recommended by PT    Recommendations for Other Services       Precautions / Restrictions Precautions Precautions: Knee;Fall Required Braces or Orthoses: Knee Immobilizer - Left Knee Immobilizer - Left: On when out of bed or walking Restrictions Weight Bearing Restrictions: Yes LLE Weight Bearing: Weight bearing as tolerated    Mobility  Bed Mobility Overal bed mobility: Needs Assistance Bed Mobility: Supine to Sit     Supine to sit: Supervision        Transfers Overall transfer level: Needs assistance Equipment used: Rolling walker (2 wheeled) Transfers: Sit to/from Stand Sit to Stand: Supervision            Ambulation/Gait Ambulation/Gait assistance: Supervision Ambulation Distance (Feet): 60 Feet Assistive device: Rolling walker (2 wheeled) Gait Pattern/deviations: Step-to pattern;Decreased weight shift to left     General Gait Details: slow pattern   Stairs            Wheelchair Mobility    Modified Rankin (Stroke Patients Only)       Balance Overall balance assessment: Needs assistance Sitting-balance support: No upper extremity supported Sitting balance-Leahy Scale: Good     Standing balance support: Single extremity supported Standing balance-Leahy Scale: Poor                      Cognition Arousal/Alertness: Awake/alert Behavior During Therapy: WFL for tasks assessed/performed Overall Cognitive Status: Within Functional Limits for tasks  assessed                      Exercises      General Comments        Pertinent Vitals/Pain Pain Assessment: 0-10 Pain Score: 5  Pain Location: Lt knee Pain Descriptors / Indicators: Sore Pain Intervention(s): Monitored during session    Home Living                      Prior Function            PT Goals (current goals can now be found in the care plan section) Acute Rehab PT Goals Patient Stated Goal: go back home for more therapy PT Goal Formulation: With patient Time For Goal Achievement: 08/22/14 Potential to Achieve Goals: Good Progress towards PT goals: Progressing toward goals    Frequency  7X/week    PT Plan Current plan remains appropriate    Co-evaluation             End of Session Equipment Utilized During Treatment: Gait belt;Left knee immobilizer Activity Tolerance: Patient tolerated treatment well Patient left: in chair;with call bell/phone within reach     Time: 0916-0939 PT Time Calculation (min) (ACUTE ONLY): 23 min  Charges:  $Gait Training: 23-37 mins                    G Codes:      Cassell Clement, PT, CSCS Pager [phone] Office [phone]  08/10/2014, 9:44 AM

## 2014-08-11 LAB — TISSUE CULTURE: CULTURE: NO GROWTH

## 2014-08-11 LAB — GLUCOSE, CAPILLARY
GLUCOSE-CAPILLARY: 142 mg/dL — AB (ref 65–99)
Glucose-Capillary: 160 mg/dL — ABNORMAL HIGH (ref 65–99)
Glucose-Capillary: 122 mg/dL — ABNORMAL HIGH (ref 65–99)
Glucose-Capillary: 110 mg/dL — ABNORMAL HIGH (ref 65–99)

## 2014-08-11 MED ORDER — DARBEPOETIN ALFA 200 MCG/0.4ML IJ SOSY: 200.0000 ug | PREFILLED_SYRINGE | INTRAMUSCULAR | Status: DC

## 2014-08-11 MED ORDER — SODIUM CHLORIDE 0.9 % IV SOLN
100.0000 mg | INTRAVENOUS | Status: DC
  Administered 2014-08-12: 100 mg via INTRAVENOUS
  Filled 2014-08-11 (×2): qty 8

## 2014-08-11 MED ORDER — SORBITOL 70 % SOLN
30.0000 mL | Status: DC | PRN
  Filled 2014-08-11: qty 30

## 2014-08-11 MED ORDER — POLYETHYLENE GLYCOL 3350 17 G PO PACK
17.0000 g | PACK | Freq: Every day | ORAL | Status: DC
  Administered 2014-08-11: 17 g via ORAL
  Filled 2014-08-11: qty 1

## 2014-08-11 MED ORDER — DARBEPOETIN ALFA 100 MCG/0.5ML IJ SOSY: 100.0000 ug | PREFILLED_SYRINGE | INTRAMUSCULAR | Status: DC

## 2014-08-11 MED ORDER — CALCITRIOL 0.5 MCG PO CAPS: 1.0000 ug | ORAL_CAPSULE | ORAL | Status: DC

## 2014-08-11 MED ORDER — RENA-VITE PO TABS
1.0000 | ORAL_TABLET | Freq: Every day | ORAL | Status: DC
  Administered 2014-08-11: 1 via ORAL
  Filled 2014-08-11: qty 1

## 2014-08-11 MED ORDER — CALCIUM ACETATE (PHOS BINDER) 667 MG PO CAPS
1334.0000 mg | ORAL_CAPSULE | Freq: Three times a day (TID) | ORAL | Status: DC
  Administered 2014-08-11 – 2014-08-12 (×3): 1334 mg via ORAL
  Filled 2014-08-11 (×3): qty 2

## 2014-08-11 NOTE — Progress Notes (Signed)
Continues to refuse suppository. States, "I'm not going to do it right now. Maybe in the morning."

## 2014-08-11 NOTE — Progress Notes (Signed)
[name], male   DOB: 1960/04/24, 54 y.o.   MRN: [id number] No acute changes.  Vitals stable.  Mobilizing well.  Motivated. Dressing on left leg clean and dry.  Calf soft.  Likely discharge to home tomorrow.

## 2014-08-11 NOTE — Progress Notes (Signed)
Physical Therapy Treatment Patient Details Name: Matthew Kline MRN: [id number] DOB: Oct 14, 1960 Today's Date: 08/11/2014    History of Present Illness removal LT total knee arthroplasty and placement of antibiotics spacer    PT Comments    Pt progressing with mobility, tolerated 2 stairs well with rail and min A. Encouraged pt to keep LLE elevated in extension when sitting in chair. PT will continue to follow.   Follow Up Recommendations  Home health PT     Equipment Recommendations  None recommended by PT    Recommendations for Other Services       Precautions / Restrictions Precautions Precautions: Knee;Fall Precaution Comments: reviewed positioning, pt prefers to sit in recliner with feet on floor. This puts flexoin force on left knee, recommended to pt that left LE remain propped Required Braces or Orthoses: Knee Immobilizer - Left Knee Immobilizer - Left: On when out of bed or walking Restrictions Weight Bearing Restrictions: Yes LLE Weight Bearing: Weight bearing as tolerated    Mobility  Bed Mobility Overal bed mobility: Needs Assistance Bed Mobility: Supine to Sit     Supine to sit: Supervision     General bed mobility comments: pt able to get LLE off bed to get to edge without physical assistance  Transfers Overall transfer level: Needs assistance Equipment used: Rolling walker (2 wheeled) Transfers: Sit to/from Stand Sit to Stand: Supervision            Ambulation/Gait Ambulation/Gait assistance: Min guard Ambulation Distance (Feet): 100 Feet Assistive device: Rolling walker (2 wheeled) Gait Pattern/deviations: Step-to pattern;Decreased weight shift to left Gait velocity: decreased   General Gait Details: vaults over LLE due to KI   Stairs Stairs: Yes Stairs assistance: Min assist Stair Management: One rail Left;Step to pattern;Forwards Number of Stairs: 2 General stair comments: pt apprehensive about stepping up with right and wt  being on left, min A given to right side and rail on left. Pt did well after first step. Had an easier time with descension  Wheelchair Mobility    Modified Rankin (Stroke Patients Only)       Balance Overall balance assessment: Needs assistance Sitting-balance support: Feet supported Sitting balance-Leahy Scale: Good     Standing balance support: Bilateral upper extremity supported Standing balance-Leahy Scale: Poor Standing balance comment: needs UE support                    Cognition Arousal/Alertness: Awake/alert Behavior During Therapy: WFL for tasks assessed/performed Overall Cognitive Status: Within Functional Limits for tasks assessed                      Exercises      General Comments General comments (skin integrity, edema, etc.): pt feels that his knee has no stability with spacer in. Discussed use of KI and need for keeping knee extended within it for support      Pertinent Vitals/Pain Pain Assessment: Faces Faces Pain Scale: Hurts little more Pain Location: left knee Pain Intervention(s): Monitored during session;Limited activity within patient's tolerance    Home Living                      Prior Function            PT Goals (current goals can now be found in the care plan section) Acute Rehab PT Goals Patient Stated Goal: go back home for more therapy PT Goal Formulation: With patient Time For Goal Achievement: 08/22/14 Potential to  Achieve Goals: Good Progress towards PT goals: Progressing toward goals    Frequency  7X/week    PT Plan Current plan remains appropriate    Co-evaluation             End of Session Equipment Utilized During Treatment: Gait belt;Left knee immobilizer Activity Tolerance: Patient tolerated treatment well Patient left: in chair;with call bell/phone within reach     Time: 1425-1445 PT Time Calculation (min) (ACUTE ONLY): 20 min  Charges:  $Gait Training: 8-22 mins                     G Codes:     Leighton Roach, PT  Acute Rehab Services  [phone]  Leighton Roach 08/11/2014, 3:56 PM

## 2014-08-11 NOTE — Progress Notes (Signed)
Voided 250cc clear yellow urine without difficulty. Will continue to monitor.

## 2014-08-11 NOTE — Progress Notes (Signed)
[facility] for Infectious Disease  Date of Admission:  08/07/2014  Antibiotics: vanco ceftaz  Subjective: No changes  Objective: Temp:  [98.3 F (36.8 C)-99.1 F (37.3 C)] 98.3 F (36.8 C) (07/31 0624) Pulse Rate:  [81-98] 81 (07/31 0624) Resp:  [17-18] 18 (07/31 0624) BP: (104-138)/(56-68) 118/60 mmHg (07/31 0624) SpO2:  [95 %-98 %] 97 % (07/31 0624) Weight:  [194 lb 10.7 oz (88.3 kg)] 194 lb 10.7 oz (88.3 kg) (07/30 1330)  General: awake, nad   Lab Results Lab Results  Component Value Date   WBC 8.6 08/10/2014   HGB 7.9* 08/10/2014   HCT 25.9* 08/10/2014   MCV 85.2 08/10/2014   PLT 205 08/10/2014    Lab Results  Component Value Date   CREATININE 5.44* 08/09/2014   BUN 30* 08/09/2014   NA 133* 08/09/2014   K 4.4 08/09/2014   CL 95* 08/09/2014   CO2 27 08/09/2014    Lab Results  Component Value Date   ALT 12* 08/07/2014   AST 15 08/07/2014   ALKPHOS 71 08/07/2014   BILITOT 0.9 08/07/2014      Microbiology: Recent Results (from the past 240 hour(s))  Anaerobic culture     Status: None (Preliminary result)   Collection Time: 08/07/14  5:22 PM  Result Value Ref Range Status   Specimen Description TISSUE LEFT KNEE  Final   Special Requests PATIENT ON FOLLOWING ANCEF  Final   Gram Stain   Final    RARE WBC PRESENT, PREDOMINANTLY PMN NO ORGANISMS SEEN Performed at [facility]    Culture   Final    NO ANAEROBES ISOLATED; CULTURE IN PROGRESS FOR 5 DAYS Performed at [facility]    Report Status PENDING  Incomplete  Fungus Culture with Smear     Status: None (Preliminary result)   Collection Time: 08/07/14  5:22 PM  Result Value Ref Range Status   Specimen Description TISSUE LEFT KNEE  Final   Special Requests PATIENT ON FOLLOWING ANCEF  Final   Fungal Smear   Final    NO YEAST OR FUNGAL ELEMENTS SEEN Performed at [facility]    Culture   Final    CULTURE IN PROGRESS FOR FOUR WEEKS Performed at [facility]    Report Status PENDING  Incomplete  Tissue culture     Status: None   Collection Time: 08/07/14  5:22 PM  Result Value Ref Range Status   Specimen Description TISSUE LEFT KNEE  Final   Special Requests PATIENT ON FOLLOWING ANCEF  Final   Gram Stain   Final    RARE WBC PRESENT, PREDOMINANTLY PMN NO ORGANISMS SEEN Performed at [facility]    Culture   Final    NO GROWTH 3 DAYS Performed at [facility]    Report Status 08/11/2014 FINAL  Final  AFB culture with smear     Status: None (Preliminary result)   Collection Time: 08/07/14  5:22 PM  Result Value Ref Range Status   Specimen Description TISSUE LEFT KNEE  Final   Special Requests PATIENT ON FOLLOWING ANCEF  Final   Acid Fast Smear   Final    NO ACID FAST BACILLI SEEN Performed at [facility]    Culture   Final    CULTURE WILL BE EXAMINED FOR 6 WEEKS BEFORE ISSUING A FINAL REPORT Performed at [facility]    Report Status PENDING  Incomplete    Studies/Results: No results found.  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  Rec treat for 6 weeks with vancomycin and ceftazidime after dialysis.   We are available for follow up if needed. Thanks for consult  Scharlene Gloss, [place] [facility] Infectious Disease [place] [email or web address] [phone] pager   [phone] cell 08/11/2014, 11:33 AM

## 2014-08-11 NOTE — Progress Notes (Signed)
[facility] Progress Note  Assessment/Plan: 1. S/P TKR removal/ placement AB beads. Per ID plan 6 weeks vanc/fortaz with HD. Culture results pending.  2. ESRD on HD MWF- holding heparin post op 3. Volume - at dry wt Sat - BP ok,  4. HTN bp's soft, holding norvasc 5. DM on oral agents 6. Anemia Hb 10 > 7.9 - last Mircera 225 7/20 - due for redose this Wed; on weekly venofer - drop likely related to surgery - bump IV Fe x 3 then back to 50 per week, transfuse prn 7. Urinary retention - dc narcotics, bzd, muscle relaxers and dc foley 8. Constipation - enema today - refused yesterday -also declined dulcolax; added prn sorbitol and daily miralax 9. MBD - resume binders and calcitriol  Myriam Jacobson, PA-C [place] [phone] 08/11/2014,10:38 AM  LOS: 4 days   Pt seen, examined and agree w A/P as above.  Kelly Splinter MD pager [phone]    cell [phone] 08/11/2014, 1:32 PM     Subjective:   Last BM 7/22 - some nausea no vomiting  Objective Filed Vitals:   08/10/14 1323 08/10/14 1330 08/10/14 2105 08/11/14 0624  BP: 118/58 116/62 138/68 118/60  Pulse: 92 96 98 81  Temp:   99.1 F (37.3 C) 98.3 F (36.8 C)  TempSrc:   Oral Oral  Resp:  17 17 18   Height:      Weight:  88.3 kg (194 lb 10.7 oz)    SpO2:  95% 98% 97%   Physical Exam General: talkative sitting in chair Heart: RRR Lungs: no rales Abdomen: soft + BS Extremities: no edema on right Left leg wriapped tr LE edema Dialysis Access: left AVF + bruit  Dialysis Orders: MWF 88kg 8hr Nocturnal Heparin   Additional Objective Labs: Basic Metabolic Panel:  Recent Labs Lab 08/07/14 1515 08/08/14 1015 08/09/14 0900  NA 136 134* 133*  K 5.2* 5.7* 4.4  CL 102 101 95*  CO2 22 23 27   GLUCOSE 69 109* 173*  BUN 42* 47* 30*  CREATININE 6.63* 7.29* 5.44*  CALCIUM 9.0 8.5* 8.4*  PHOS  --  6.5*  --    Liver Function Tests:  Recent Labs Lab 08/07/14 1515 08/08/14 1015   AST 15  --   ALT 12*  --   ALKPHOS 71  --   BILITOT 0.9  --   PROT 7.6  --   ALBUMIN 3.1* 2.4*   CBC:  Recent Labs Lab 08/07/14 1515 08/08/14 0500 08/09/14 0403 08/10/14 0350  WBC 8.7 8.4 9.9 8.6  NEUTROABS  --  4.4 5.7 5.0  HGB 10.6* 8.8* 8.7* 7.9*  HCT 34.8* 28.6* 28.4* 25.9*  MCV 87.4 88.8 88.2 85.2  PLT 274 225 206 205  Cardiac Enzymes:  Recent Labs Lab 08/08/14 0718  TROPONINI <0.03   CBG:  Recent Labs Lab 08/10/14 0655 08/10/14 1110 08/10/14 1710 08/10/14 2110 08/11/14 0627  GLUCAP 103* 164* 202* 153* 142*  Medications: . sodium chloride     . aspirin EC  325 mg Oral Daily  . [START ON 08/12/2014] cefTAZidime (FORTAZ)  IV  2 g Intravenous Q M,W,F-HD  . docusate sodium  1 enema Rectal Once  . gabapentin  300 mg Oral TID  . glipiZIDE  5 mg Oral QAC breakfast  . insulin aspart  0-9 Units Subcutaneous TID WC  . sodium chloride  250 mL Intravenous Once  . tamsulosin  0.4 mg Oral Daily  . [START ON 08/12/2014] vancomycin  1,000 mg Intravenous Q M,W,F-HD

## 2014-08-12 LAB — CBC
HCT: 26.2 % — ABNORMAL LOW (ref 39.0–52.0)
Hemoglobin: 7.9 g/dL — ABNORMAL LOW (ref 13.0–17.0)
MCH: 25.6 pg — ABNORMAL LOW (ref 26.0–34.0)
MCHC: 30.2 g/dL (ref 30.0–36.0)
MCV: 85.1 fL (ref 78.0–100.0)
Platelets: 292 10*3/uL (ref 150–400)
RBC: 3.08 MIL/uL — ABNORMAL LOW (ref 4.22–5.81)
RDW: 18.3 % — ABNORMAL HIGH (ref 11.5–15.5)
WBC: 8.6 10*3/uL (ref 4.0–10.5)

## 2014-08-12 LAB — GLUCOSE, CAPILLARY
GLUCOSE-CAPILLARY: 102 mg/dL — AB (ref 65–99)
Glucose-Capillary: 97 mg/dL (ref 65–99)

## 2014-08-12 LAB — ANAEROBIC CULTURE

## 2014-08-12 LAB — RENAL FUNCTION PANEL
Albumin: 2.1 g/dL — ABNORMAL LOW (ref 3.5–5.0)
Anion gap: 10 (ref 5–15)
BUN: 39 mg/dL — ABNORMAL HIGH (ref 6–20)
CO2: 24 mmol/L (ref 22–32)
Calcium: 8.8 mg/dL — ABNORMAL LOW (ref 8.9–10.3)
Chloride: 102 mmol/L (ref 101–111)
Creatinine, Ser: 5.72 mg/dL — ABNORMAL HIGH (ref 0.61–1.24)
GFR calc Af Amer: 12 mL/min — ABNORMAL LOW (ref >60)
GFR calc non Af Amer: 10 mL/min — ABNORMAL LOW (ref >60)
Glucose, Bld: 99 mg/dL (ref 65–99)
Phosphorus: 5.9 mg/dL — ABNORMAL HIGH (ref 2.5–4.6)
Potassium: 4.6 mmol/L (ref 3.5–5.1)
Sodium: 136 mmol/L (ref 135–145)

## 2014-08-12 MED ORDER — ALTEPLASE 2 MG IJ SOLR
2.0000 mg | Freq: Once | INTRAMUSCULAR | Status: DC | PRN
  Filled 2014-08-12: qty 2

## 2014-08-12 MED ORDER — ASPIRIN 325 MG PO TBEC: 325.0000 mg | DELAYED_RELEASE_TABLET | Freq: Every day | ORAL | Status: DC

## 2014-08-12 MED ORDER — TRAMADOL HCL 50 MG PO TABS: 50.0000 mg | ORAL_TABLET | Freq: Four times a day (QID) | ORAL | Status: DC | PRN

## 2014-08-12 MED ORDER — VANCOMYCIN HCL IN DEXTROSE 1-5 GM/200ML-% IV SOLN: 1000.0000 mg | INTRAVENOUS | Status: DC

## 2014-08-12 MED ORDER — HEPARIN SODIUM (PORCINE) 1000 UNIT/ML DIALYSIS: 1000.0000 [IU] | INTRAMUSCULAR | Status: DC | PRN

## 2014-08-12 MED ORDER — SODIUM CHLORIDE 0.9 % IV SOLN: 100.0000 mL | INTRAVENOUS | Status: DC | PRN

## 2014-08-12 MED ORDER — TRAMADOL HCL 50 MG PO TABS
ORAL_TABLET | ORAL | Status: AC
  Filled 2014-08-12: qty 1

## 2014-08-12 MED ORDER — LIDOCAINE-PRILOCAINE 2.5-2.5 % EX CREA: 1.0000 "application " | TOPICAL_CREAM | CUTANEOUS | Status: DC | PRN

## 2014-08-12 MED ORDER — LIDOCAINE HCL (PF) 1 % IJ SOLN: 5.0000 mL | INTRAMUSCULAR | Status: DC | PRN

## 2014-08-12 MED ORDER — NEPRO/CARBSTEADY PO LIQD
237.0000 mL | ORAL | Status: DC | PRN
Start: 2014-08-12 — End: 2014-08-12
  Filled 2014-08-12: qty 237

## 2014-08-12 MED ORDER — SODIUM CHLORIDE 0.9 % IV SOLN
100.0000 mL | INTRAVENOUS | Status: DC | PRN
Start: 2014-08-12 — End: 2014-08-12

## 2014-08-12 MED ORDER — DEXTROSE 5 % IV SOLN: 2.0000 g | INTRAVENOUS | Status: DC

## 2014-08-12 MED ORDER — PENTAFLUOROPROP-TETRAFLUOROETH EX AERO: 1.0000 "application " | INHALATION_SPRAY | CUTANEOUS | Status: DC | PRN

## 2014-08-12 NOTE — Procedures (Signed)
Patient was seen on dialysis and the procedure was supervised. BFR 400 Via LUE AVF BP is 123/57.  Patient appears to be tolerating treatment well and is hoping for discharge later today.  He will continue with [place] and Vanco with outpt HD.

## 2014-08-12 NOTE — Progress Notes (Signed)
Patient voided 400 cc of clear, yellow urine without difficulty. Nursing will continue to monitor.

## 2014-08-12 NOTE — Progress Notes (Signed)
[name], male   DOB: August 12, 1960, 54 y.o.   MRN: [id number] Discharge today after dialysis. Patient is up ambulating in the room independently with a walker. Patient's pain is controlled with tramadol. We'll continue with vancomycin and Fortaz for 6 weeks.

## 2014-08-12 NOTE — Progress Notes (Signed)
Physical Therapy Treatment Patient Details Name: Matthew Kline MRN: [id number] DOB: April 11, 1960 Today's Date: 08/12/2014    History of Present Illness removal LT total knee arthroplasty and placement of antibiotics spacer    PT Comments    Patient is making good progress with PT.  From a mobility standpoint anticipate patient will be ready for DC home. Patient denies any questions or concerns.       Follow Up Recommendations  Home health PT     Equipment Recommendations  None recommended by PT    Recommendations for Other Services       Precautions / Restrictions Precautions Precautions: Knee;Fall Required Braces or Orthoses: Knee Immobilizer - Left Knee Immobilizer - Left: On when out of bed or walking Restrictions Weight Bearing Restrictions: Yes LLE Weight Bearing: Weight bearing as tolerated    Mobility  Bed Mobility                  Transfers Overall transfer level: Needs assistance Equipment used: Rolling walker (2 wheeled) Transfers: Sit to/from Stand Sit to Stand: Modified independent (Device/Increase time)         General transfer comment: using rw  Ambulation/Gait Ambulation/Gait assistance: Modified independent (Device/Increase time) Ambulation Distance (Feet): 100 Feet Assistive device: Rolling walker (2 wheeled) Gait Pattern/deviations: Step-through pattern;Decreased weight shift to left Gait velocity: decreased       Stairs         General stair comments: declined stairs, reports feeling confident at this time. Reviewed sequence for stairs verbally.   Wheelchair Mobility    Modified Rankin (Stroke Patients Only)       Balance Overall balance assessment: Needs assistance Sitting-balance support: No upper extremity supported Sitting balance-Leahy Scale: Good     Standing balance support: Single extremity supported Standing balance-Leahy Scale: Poor                      Cognition Arousal/Alertness:  Awake/alert Behavior During Therapy: WFL for tasks assessed/performed Overall Cognitive Status: Within Functional Limits for tasks assessed                      Exercises      General Comments        Pertinent Vitals/Pain Pain Assessment: 0-10 Pain Score: 4  Pain Location: Lt knee Pain Descriptors / Indicators: Sore Pain Intervention(s): Monitored during session    Home Living                      Prior Function            PT Goals (current goals can now be found in the care plan section) Acute Rehab PT Goals Patient Stated Goal: go back home for more therapy PT Goal Formulation: With patient Time For Goal Achievement: 08/22/14 Potential to Achieve Goals: Good Progress towards PT goals: Progressing toward goals    Frequency  7X/week    PT Plan Current plan remains appropriate    Co-evaluation             End of Session Equipment Utilized During Treatment: Gait belt;Left knee immobilizer Activity Tolerance: Patient tolerated treatment well Patient left: in chair;with call bell/phone within reach;with family/visitor present     Time: 1410-1425 PT Time Calculation (min) (ACUTE ONLY): 15 min  Charges:  $Gait Training: 8-22 mins                    G Codes:  Cassell Clement, PT, CSCS Pager [phone] Office [phone]  08/12/2014, 2:31 PM

## 2014-08-12 NOTE — Progress Notes (Signed)
PT Cancellation Note  Patient Details Name: Matthew Kline MRN: [id number] DOB: [birth year]   Cancelled Treatment:    Reason Eval/Treat Not Completed: Patient at procedure or test/unavailable, will check back as able.    Cassell Clement, PT, CSCS Pager [phone] Office [phone]  08/12/2014, 9:50 AM

## 2014-08-12 NOTE — Progress Notes (Signed)
Pt bladder scan showed 700 cc of urine. Dr. Jonnie Finner paged, called back and verbally ordered in and out catheter. Pt refused in and out as of the moment and verbalized "Let me try to pee on my own first". Nursing will continue to monitor.

## 2014-08-12 NOTE — Discharge Summary (Signed)
Physician Discharge Summary  [name] MRN: [id number] DOB/AGE: 01/25/1960 54 y.o.  Admit date: 08/07/2014 Discharge date: 08/12/2014  Admission Diagnoses: Septic left total knee arthroplasty  Discharge Diagnoses:  Active Problems:   Septic joint of left knee joint   Discharged Condition: stable  Hospital Course: Patient's hospital course was essentially unremarkable. He underwent removal of the total knee arthroplasty. Postoperatively patient progressed well with therapy infectious disease was consulted he was started on vancomycin and Fortaz and was discharged to home in stable condition after dialysis.  Consults: ID and nephrology  Significant Diagnostic Studies: labs: Routine labs  Treatments: dialysis: Hemodialysis and surgery: See operative note  Discharge Exam: Blood pressure 100/59, pulse 98, temperature 98.6 F (37 C), temperature source Oral, resp. rate 18, height 5\' 11"  (1.803 m), weight 88.3 kg (194 lb 10.7 oz), SpO2 98 %. Incision/Wound: dressing clean and dry  Disposition: 01-Home or Self Care  Discharge Instructions    Call MD / Call 911    Complete by:  As directed   If you experience chest pain or shortness of breath, CALL 911 and be transported to the hospital emergency room.  If you develope a fever above 101 F, pus (white drainage) or increased drainage or redness at the wound, or calf pain, call your surgeon's office.     Constipation Prevention    Complete by:  As directed   Drink plenty of fluids.  Prune juice may be helpful.  You may use a stool softener, such as Colace (over the counter) 100 mg twice a day.  Use MiraLax (over the counter) for constipation as needed.     Diet - low sodium heart healthy    Complete by:  As directed      Increase activity slowly as tolerated    Complete by:  As directed             Medication List    STOP taking these medications        HYDROcodone-acetaminophen 5-325 MG per tablet  Commonly  known as:  NORCO/VICODIN     oxyCODONE-acetaminophen 5-325 MG per tablet  Commonly known as:  PERCOCET/ROXICET      TAKE these medications        amitriptyline 50 MG tablet  Commonly known as:  ELAVIL  Take 1 tablet (50 mg total) by mouth at bedtime.     amLODipine 5 MG tablet  Commonly known as:  NORVASC  Take 5 mg by mouth daily.     aspirin 325 MG EC tablet  Take 1 tablet (325 mg total) by mouth daily.     cefTAZidime 2 g in dextrose 5 % 50 mL  Inject 2 g into the vein every Monday, Wednesday, and Friday with hemodialysis.     gabapentin 100 MG capsule  Commonly known as:  NEURONTIN  Take 400 mg by mouth at bedtime.     glipiZIDE 5 MG tablet  Commonly known as:  GLUCOTROL  Take 1 tablet (5 mg total) by mouth daily before breakfast.     ibuprofen 200 MG tablet  Commonly known as:  ADVIL,MOTRIN  Take 200 mg by mouth daily as needed (pain).     oseltamivir 30 MG capsule  Commonly known as:  TAMIFLU  Take 1 capsule (30 mg total) by mouth daily. Only take medication AFTER dialysis x 5 doses     sildenafil 25 MG tablet  Commonly known as:  VIAGRA  Take 2 tablets (50 mg total) by mouth  daily as needed for erectile dysfunction.     traMADol 50 MG tablet  Commonly known as:  ULTRAM  Take 1 tablet (50 mg total) by mouth every 6 (six) hours as needed for moderate pain.     vancomycin 1 GM/200ML Soln  Commonly known as:  VANCOCIN  Inject 200 mLs (1,000 mg total) into the vein every Monday, Wednesday, and Friday with hemodialysis.     VITAMIN D PO  Take 1 tablet by mouth every Monday, Wednesday, and Friday. Take at dialysis     zolpidem 10 MG tablet  Commonly known as:  AMBIEN  Take 1 tablet (10 mg total) by mouth at bedtime as needed for sleep.           Follow-up Information    Follow up with [facility].   Why:  They will contact you to schedule therapy visits.   Contact information:   [address] [place]  [phone] [phone]       Follow up with Ileta Ofarrell V, MD In 2 weeks.   Specialty:  Orthopedic Surgery   Contact information:   [place] [address] [phone]       Signed: [name] 08/12/2014, 6:48 AM

## 2014-08-12 NOTE — Care Management Important Message (Signed)
Important Message  Patient Details  Name: Matthew Kline MRN: [id number] Date of Birth: 1960/12/13   Medicare Important Message Given:  Yes-second notification given    Delorse Lek 08/12/2014, 12:12 PM

## 2014-08-12 NOTE — Progress Notes (Signed)
Patient verbalized frustration with repeated hospitalizations r/t his knee. D/C instructions and Rx's reviewed. Rx's given. Tech completed bladder scan - see documentation. Patient able to void effectively now - see I&O documentation. Patient and wife have packed personal belongings. Given Ultram 50 mg PO for pain management for the drive home. This RN took patient in w/c to [place] tower exit with his wife. Assisted to enter car - wife to drive patient home.

## 2014-08-19 ENCOUNTER — Other Ambulatory Visit: Payer: Self-pay | Admitting: *Deleted

## 2014-08-19 MED ORDER — ZOLPIDEM TARTRATE 10 MG PO TABS: 10.0000 mg | ORAL_TABLET | Freq: Every evening | ORAL | Status: DC | PRN

## 2014-08-19 NOTE — Telephone Encounter (Signed)
Patient requested and to be faxed to pharmacy.  

## 2014-08-28 ENCOUNTER — Encounter: Payer: Self-pay | Admitting: Internal Medicine

## 2014-08-28 ENCOUNTER — Ambulatory Visit (INDEPENDENT_AMBULATORY_CARE_PROVIDER_SITE_OTHER): Payer: Commercial Managed Care - HMO | Admitting: Internal Medicine

## 2014-08-28 VITALS — BP 140/90 | HR 104 | Temp 98.4°F | Resp 18 | Ht 71.0 in | Wt 195.2 lb

## 2014-08-28 DIAGNOSIS — J301 Allergic rhinitis due to pollen: Secondary | ICD-10-CM

## 2014-08-28 DIAGNOSIS — N186 End stage renal disease: Secondary | ICD-10-CM | POA: Diagnosis not present

## 2014-08-28 DIAGNOSIS — Z96659 Presence of unspecified artificial knee joint: Secondary | ICD-10-CM

## 2014-08-28 DIAGNOSIS — M009 Pyogenic arthritis, unspecified: Secondary | ICD-10-CM | POA: Diagnosis not present

## 2014-08-28 DIAGNOSIS — T8450XD Infection and inflammatory reaction due to unspecified internal joint prosthesis, subsequent encounter: Secondary | ICD-10-CM

## 2014-08-28 DIAGNOSIS — E1121 Type 2 diabetes mellitus with diabetic nephropathy: Secondary | ICD-10-CM

## 2014-08-28 DIAGNOSIS — Z992 Dependence on renal dialysis: Secondary | ICD-10-CM

## 2014-08-28 DIAGNOSIS — E1129 Type 2 diabetes mellitus with other diabetic kidney complication: Secondary | ICD-10-CM

## 2014-08-28 DIAGNOSIS — T8459XD Infection and inflammatory reaction due to other internal joint prosthesis, subsequent encounter: Secondary | ICD-10-CM

## 2014-08-29 NOTE — Patient Instructions (Signed)
start allegra 180mg  daily for seasonal allergy  --use saline nasal spray prn  --complete vanco/zosyn per Ortho  --f/u with ortho as scheduled  --f/u with HD as scheduled MWF  --cont other meds as ordered

## 2014-08-29 NOTE — Progress Notes (Signed)
[name], male   DOB: 11-Jun-1960, 54 y.o.   MRN: [id number]    Location:    [facility]   Place of Service:  OFFICE   Chief Complaint  Patient presents with  . Hospitalization Follow-up    Hospital follow-up    HPI:  54 yo male seen today for f/u after hospital stay for infected left knee hardware s/p removal of hardware. He now has a spacer in knee. He is receiving vanco/foraz IV with HD on MWF. vanco trough monitored by pharmacy. Staples removed from left knee last week and he has several steri strips left on distal knee. Next Ortho f/u in September  He c/o watery eyes, chest congestion and low grade temp (Tmax 100.4) x several days. He has not taken any antihistamine. No sick contacts. BS 70-100s. No low BS reactions.  Past Medical History  Diagnosis Date  . Polymyalgia rheumatica   . Hypertension   . Diabetic neuropathy   . Osteomyelitis of foot, left, acute   . Anxiety   . Insomnia, unspecified   . Unspecified vitamin D deficiency   . Anemia, unspecified   . Other chronic postoperative pain   . Unspecified hereditary and idiopathic peripheral neuropathy   . Allergy   . Unspecified osteomyelitis, site unspecified   . Long term (current) use of anticoagulants   . 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 Ulcer     diabetic foot   . Arthralgia 2010    polyarticular  . Hemorrhoids, internal 10/2011    small  . CHF (congestive heart failure)   . CHF (congestive heart failure) 07/25/2009    denies  . Hemodialysis access site with mature fistula   . Myocardial infarction 1995  . Pneumonia     "probably 4-5 times" (05/09/2014)  . Sleep apnea     "lost weight; no more problem" (05/09/2014)  . Type II diabetes mellitus dx'd 1995  . History of blood transfusion     "related to the anemia"  . GERD (gastroesophageal reflux disease)     hx "before I lost weight"  . ESRD (end stage renal disease) on dialysis     started 12/2012; "MWF; [facility]"  .  Stroke 01/10/06    denies residual on 05/09/2014  . Arthritis     "back, knees" (05/09/2014)  . Chronic lower back pain     Past Surgical History  Procedure Laterality Date  . Amputation  01/21/2012    Procedure: AMPUTATION RAY;  Surgeon: [name], MD;  Location: [place];  Service: Orthopedics;  Laterality: Left;  Left Foot 4th Ray Amputation  . Anterior cervical decomp/discectomy fusion  02/2011  . Knee arthroscopy Left 08-25-2012  . Toe amputation Bilateral     "I've lost 7 toes over the last 7 years" (05/09/2014)  . Refractive surgery Bilateral   . Bascilic vein transposition Left 10/19/2012    Procedure: BASCILIC VEIN TRANSPOSITION;  Surgeon: Serafina Mitchell, MD;  Location: [place];  Service: Vascular;  Laterality: Left;  . Tonsillectomy    . Amputation Left 05/04/2013    Procedure: AMPUTATION DIGIT;  Surgeon: [name], MD;  Location: [place];  Service: Orthopedics;  Laterality: Left;  Left Great Toe Amputation at M[facility]  . Toe surgery Left April 2015    Big toe removed on left foot.  . Total knee arthroplasty Left 04/10/2014    Procedure: TOTAL KNEE ARTHROPLASTY;  Surgeon: [name], MD;  Location: [place];  Service: Orthopedics;  Laterality: Left;  . Wound debridement Left 05/09/2014    Dehiscence Left Total Knee Arthroplasty Incision  . Back surgery    . Uvulopalatopharyngoplasty, tonsillectomy and septoplasty  ~ 1989  . I&d extremity Left 05/09/2014    Procedure: Irrigation and Debridement Left Knee and Closure of Total Knee Arthroplasty Incision;  Surgeon: [name], MD;  Location: [place];  Service: Orthopedics;  Laterality: Left;  . I&d knee with poly exchange Left 05/31/2014    Procedure: IRRIGATION AND DEBRIDEMENT LEFT KNEE, PLACE ANTIBIOTIC BEADS,  POLY EXCHANGE;  Surgeon: [name], MD;  Location: [place];  Service: Orthopedics;  Laterality: Left;  . Excisional total knee arthroplasty with antibiotic spacers Left 08/07/2014    Procedure: Replace Left Total Knee  Arthroplasty,  Place Antibiotic Spacer;  Surgeon: [name], MD;  Location: [place];  Service: Orthopedics;  Laterality: Left;    Patient Care Team: Gildardo Cranker, DO as PCP - General (Internal Medicine)  Social History   Social History  . Marital Status: Married    Spouse Name: N/A  . Number of Children: N/A  . Years of Education: N/A   Occupational History  . Not on file.   Social History Main Topics  . Smoking status: Current Some Day Smoker -- 0.12 packs/day for 32 years    Types: Cigarettes  . Smokeless tobacco: Never Used     Comment: 3 -4 cig daily   . Alcohol Use: No  . Drug Use: No  . Sexual Activity: Yes   Other Topics Concern  . Not on file   Social History Narrative     reports that he has been smoking Cigarettes.  He has a 3.84 pack-year smoking history. He has never used smokeless tobacco. He reports that he does not drink alcohol or use illicit drugs.  Allergies  Allergen Reactions  . Morphine And Related Other (See Comments)    hallucinations  . Tygacil [Tigecycline] Other (See Comments)    Makes him feel crazy    Medications: Patient's Medications  New Prescriptions   No medications on file  Previous Medications   AMITRIPTYLINE (ELAVIL) 50 MG TABLET    Take 1 tablet (50 mg total) by mouth at bedtime.   AMLODIPINE (NORVASC) 5 MG TABLET    Take 5 mg by mouth daily.    ASPIRIN EC 325 MG EC TABLET    Take 1 tablet (325 mg total) by mouth daily.   CEFTAZIDIME 2 G IN DEXTROSE 5 % 50 ML    Inject 2 g into the vein every Monday, Wednesday, and Friday with hemodialysis.   CHOLECALCIFEROL (VITAMIN D PO)    Take 1 tablet by mouth every Monday, Wednesday, and Friday. Take at dialysis   GABAPENTIN (NEURONTIN) 100 MG CAPSULE    Take 400 mg by mouth at bedtime.    GLIPIZIDE (GLUCOTROL) 5 MG TABLET    Take 1 tablet (5 mg total) by mouth daily before breakfast.   IBUPROFEN (ADVIL,MOTRIN) 200 MG TABLET    Take 200 mg by mouth daily as needed (pain).    OSELTAMIVIR (TAMIFLU) 30 MG CAPSULE    Take 1 capsule (30 mg total) by mouth daily. Only take medication AFTER dialysis x 5 doses   SILDENAFIL (VIAGRA) 25 MG TABLET    Take 2 tablets (50 mg total) by mouth daily as needed for erectile dysfunction.   TRAMADOL (ULTRAM) 50 MG TABLET    Take 1 tablet (50 mg total) by mouth every  6 (six) hours as needed for moderate pain.   VANCOMYCIN (VANCOCIN) 1 GM/200ML SOLN    Inject 200 mLs (1,000 mg total) into the vein every Monday, Wednesday, and Friday with hemodialysis.   ZOLPIDEM (AMBIEN) 10 MG TABLET    Take 1 tablet (10 mg total) by mouth at bedtime as needed for sleep.  Modified Medications   No medications on file  Discontinued Medications   No medications on file    Review of Systems  Constitutional: Positive for fever. Negative for chills, activity change and fatigue.  HENT: Positive for congestion. Negative for sore throat and trouble swallowing.   Eyes: Positive for discharge. Negative for visual disturbance.  Respiratory: Negative for cough, chest tightness and shortness of breath.   Cardiovascular: Negative for chest pain, palpitations and leg swelling.  Gastrointestinal: Negative for nausea, vomiting, abdominal pain and blood in stool.  Genitourinary: Negative for urgency, frequency and difficulty urinating.  Musculoskeletal: Positive for joint swelling, arthralgias and gait problem.  Skin: Negative for rash.  Neurological: Negative for weakness and headaches.  Psychiatric/Behavioral: Positive for sleep disturbance. Negative for confusion. The patient is nervous/anxious.     Filed Vitals:   08/28/14 0812  BP: 140/90  Pulse: 104  Temp: 98.4 F (36.9 C)  TempSrc: Oral  Resp: 18  Height: '5\' 11"'  (1.803 m)  Weight: 195 lb 3.2 oz (88.542 kg)  SpO2: 98%   Body mass index is 27.24 kg/(m^2).  Physical Exam  Constitutional: He is oriented to person, place, and time. He appears well-developed. No distress.  Frail appearing in NAD.  Sitting in chair  HENT:  Mouth/Throat: Oropharynx is clear and moist.  No sinus TTP  Eyes: Pupils are equal, round, and reactive to light. No scleral icterus.  Neck: Neck supple. Carotid bruit is not present. No thyromegaly present.  Cardiovascular: Normal rate, regular rhythm, normal heart sounds and intact distal pulses.  Exam reveals no gallop and no friction rub.   No murmur heard. no distal LE swelling. No calf TTP. Left arm AVF with palpable thrill/audible bruit  Pulmonary/Chest: Effort normal and breath sounds normal. He has no wheezes. He has no rales. He exhibits no tenderness.  Abdominal: Soft. Bowel sounds are normal. He exhibits no distension, no abdominal bruit, no pulsatile midline mass and no mass. There is no tenderness. There is no rebound and no guarding.  Musculoskeletal: He exhibits edema and tenderness.  Left knee swelling, increased warmth to touch but no redness. No wound dehiscience. Midline incision healing well with distal knee steri strips intact. No d/c. Reduced ROM. Gait antalgic and unsteady. He is using a walker  Lymphadenopathy:    He has no cervical adenopathy.  Neurological: He is alert and oriented to person, place, and time. He has normal reflexes.  Skin: Skin is warm and dry. No rash noted.  Psychiatric: He has a normal mood and affect. His behavior is normal.     Labs reviewed: Admission on 08/07/2014, Discharged on 08/12/2014  No results displayed because visit has over 200 results.    Admission on 07/13/2014, Discharged on 07/14/2014  Component Date Value Ref Range Status  . WBC 07/13/2014 13.5* 4.0 - 10.5 K/uL Final  . RBC 07/13/2014 4.01* 4.22 - 5.81 MIL/uL Final  . Hemoglobin 07/13/2014 10.5* 13.0 - 17.0 g/dL Final  . HCT 07/13/2014 34.5* 39.0 - 52.0 % Final  . MCV 07/13/2014 86.0  78.0 - 100.0 fL Final  . MCH 07/13/2014 26.2  26.0 - 34.0 pg Final  .  MCHC 07/13/2014 30.4  30.0 - 36.0 g/dL Final  . RDW 07/13/2014 17.4* 11.5 - 15.5 % Final    . Platelets 07/13/2014 280  150 - 400 K/uL Final  . Neutrophils Relative % 07/13/2014 73  43 - 77 % Final  . Neutro Abs 07/13/2014 9.8* 1.7 - 7.7 K/uL Final  . Lymphocytes Relative 07/13/2014 19  12 - 46 % Final  . Lymphs Abs 07/13/2014 2.6  0.7 - 4.0 K/uL Final  . Monocytes Relative 07/13/2014 7  3 - 12 % Final  . Monocytes Absolute 07/13/2014 0.9  0.1 - 1.0 K/uL Final  . Eosinophils Relative 07/13/2014 1  0 - 5 % Final  . Eosinophils Absolute 07/13/2014 0.1  0.0 - 0.7 K/uL Final  . Basophils Relative 07/13/2014 0  0 - 1 % Final  . Basophils Absolute 07/13/2014 0.0  0.0 - 0.1 K/uL Final  . Sodium 07/13/2014 137  135 - 145 mmol/L Final  . Potassium 07/13/2014 5.5* 3.5 - 5.1 mmol/L Final  . Chloride 07/13/2014 104  101 - 111 mmol/L Final  . CO2 07/13/2014 20* 22 - 32 mmol/L Final  . Glucose, Bld 07/13/2014 253* 65 - 99 mg/dL Final  . BUN 07/13/2014 39* 6 - 20 mg/dL Final  . Creatinine, Ser 07/13/2014 6.96* 0.61 - 1.24 mg/dL Final  . Calcium 07/13/2014 8.7* 8.9 - 10.3 mg/dL Final  . Total Protein 07/13/2014 8.5* 6.5 - 8.1 g/dL Final  . Albumin 07/13/2014 2.8* 3.5 - 5.0 g/dL Final  . AST 07/13/2014 19  15 - 41 U/L Final  . ALT 07/13/2014 12* 17 - 63 U/L Final  . Alkaline Phosphatase 07/13/2014 89  38 - 126 U/L Final  . Total Bilirubin 07/13/2014 0.6  0.3 - 1.2 mg/dL Final  . GFR calc non Af Amer 07/13/2014 8* >60 mL/min Final  . GFR calc Af Amer 07/13/2014 9* >60 mL/min Final   Comment: (NOTE) The eGFR has been calculated using the CKD EPI equation. This calculation has not been validated in all clinical situations. eGFR's persistently <60 mL/min signify possible Chronic Kidney Disease.   . Anion gap 07/13/2014 13  5 - 15 Final  . Lactic Acid, Venous 07/13/2014 2.26* 0.5 - 2.0 mmol/L Final  . Comment 07/13/2014 NOTIFIED PHYSICIAN   Final  . Troponin i, poc 07/13/2014 0.02  0.00 - 0.08 ng/mL Final  . Comment 3 07/13/2014          Final   Comment: Due to the release kinetics of  cTnI, a negative result within the first hours of the onset of symptoms does not rule out myocardial infarction with certainty. If myocardial infarction is still suspected, repeat the test at appropriate intervals.   . Glucose-Capillary 07/13/2014 227* 65 - 99 mg/dL Final  . Sodium 07/13/2014 138  135 - 145 mmol/L Final  . Potassium 07/13/2014 5.4* 3.5 - 5.1 mmol/L Final  . Chloride 07/13/2014 107  101 - 111 mmol/L Final  . BUN 07/13/2014 40* 6 - 20 mg/dL Final  . Creatinine, Ser 07/13/2014 7.10* 0.61 - 1.24 mg/dL Final  . Glucose, Bld 07/13/2014 252* 65 - 99 mg/dL Final  . Calcium, Ion 07/13/2014 1.11* 1.12 - 1.23 mmol/L Final  . TCO2 07/13/2014 20  0 - 100 mmol/L Final  . Hemoglobin 07/13/2014 11.2* 13.0 - 17.0 g/dL Final  . HCT 07/13/2014 33.0* 39.0 - 52.0 % Final  . pH, Arterial 07/13/2014 7.376  7.350 - 7.450 Final  . pCO2 arterial 07/13/2014 33.5* 35.0 - 45.0 mmHg  Final  . pO2, Arterial 07/13/2014 72.0* 80.0 - 100.0 mmHg Final  . Bicarbonate 07/13/2014 19.6* 20.0 - 24.0 mEq/L Final  . TCO2 07/13/2014 21  0 - 100 mmol/L Final  . O2 Saturation 07/13/2014 94.0   Final  . Acid-base deficit 07/13/2014 5.0* 0.0 - 2.0 mmol/L Final  . Patient temperature 07/13/2014 98.6 F   Final  . Collection site 07/13/2014 BRACHIAL ARTERY   Final  . Drawn by 07/13/2014 RT   Final  . Sample type 07/13/2014 ARTERIAL   Final  . Specimen Description 07/13/2014 BLOOD RIGHT ARM   Final  . Special Requests 07/13/2014 BOTTLES DRAWN AEROBIC AND ANAEROBIC 5CC EA   Final  . Culture 07/13/2014 NO GROWTH 5 DAYS   Final  . Report Status 07/13/2014 07/18/2014 FINAL   Final  . Specimen Description 07/13/2014 BLOOD RIGHT HAND   Final  . Special Requests 07/13/2014 BOTTLES DRAWN AEROBIC ONLY 10CC   Final  . Culture 07/13/2014 NO GROWTH 5 DAYS   Final  . Report Status 07/13/2014 07/19/2014 FINAL   Final  . Lactic Acid, Venous 07/13/2014 1.43  0.5 - 2.0 mmol/L Final  . Hgb A1c MFr Bld 07/14/2014 5.5  4.8 - 5.6 %  Final   Comment: (NOTE)         Pre-diabetes: 5.7 - 6.4         Diabetes: >6.4         Glycemic control for adults with diabetes: <7.0   . Mean Plasma Glucose 07/14/2014 111   Final   Comment: (NOTE) Performed At: [facility] [place], [place] [id number] Lindon Romp MD [id number]   . Sodium 07/14/2014 137  135 - 145 mmol/L Final  . Potassium 07/14/2014 4.2  3.5 - 5.1 mmol/L Final  . Chloride 07/14/2014 101  101 - 111 mmol/L Final  . CO2 07/14/2014 22  22 - 32 mmol/L Final  . Glucose, Bld 07/14/2014 205* 65 - 99 mg/dL Final  . BUN 07/14/2014 24* 6 - 20 mg/dL Final  . Creatinine, Ser 07/14/2014 4.32* 0.61 - 1.24 mg/dL Final   DELTA CHECK NOTED  . Calcium 07/14/2014 8.5* 8.9 - 10.3 mg/dL Final  . GFR calc non Af Amer 07/14/2014 14* >60 mL/min Final  . GFR calc Af Amer 07/14/2014 16* >60 mL/min Final   Comment: (NOTE) The eGFR has been calculated using the CKD EPI equation. This calculation has not been validated in all clinical situations. eGFR's persistently <60 mL/min signify possible Chronic Kidney Disease.   . Anion gap 07/14/2014 14  5 - 15 Final  . WBC 07/14/2014 8.9  4.0 - 10.5 K/uL Final  . RBC 07/14/2014 3.41* 4.22 - 5.81 MIL/uL Final  . Hemoglobin 07/14/2014 9.0* 13.0 - 17.0 g/dL Final  . HCT 07/14/2014 28.9* 39.0 - 52.0 % Final  . MCV 07/14/2014 84.8  78.0 - 100.0 fL Final  . MCH 07/14/2014 26.4  26.0 - 34.0 pg Final  . MCHC 07/14/2014 31.1  30.0 - 36.0 g/dL Final  . RDW 07/14/2014 17.4* 11.5 - 15.5 % Final  . Platelets 07/14/2014 242  150 - 400 K/uL Final  . MRSA by PCR 07/14/2014 NEGATIVE  NEGATIVE Final   Comment:        The GeneXpert MRSA Assay (FDA approved for NASAL specimens only), is one component of a comprehensive MRSA colonization surveillance program. It is not intended to diagnose MRSA infection nor to guide or monitor treatment for MRSA infections.   Edmonia Lynch 07/14/2014  187* 65 - 99 mg/dL Final  .  Glucose-Capillary 07/14/2014 167* 65 - 99 mg/dL Final  Admission on 05/31/2014, Discharged on 06/08/2014  No results displayed because visit has over 200 results.      Dg Knee Left Port  08/07/2014   CLINICAL DATA:  Revision of total knee arthroplasty, with antibiotic spacer  EXAM: PORTABLE LEFT KNEE - 1-2 VIEW  COMPARISON:  05/09/2014  FINDINGS: The total knee arthroplasty hardware has been removed. There is an antibiotic spacer. Good alignment. No bony destruction. No fracture or other immediate complication.  IMPRESSION: TKA hardware removal with placement of antibiotic spacer. No immediate complication.   Electronically Signed   By: Andreas Newport M.D.   On: 08/07/2014 19:22     Assessment/Plan   ICD-9-CM ICD-10-CM   1. Allergic rhinitis due to pollen 477.0 J30.1   2. Septic joint of left knee joint due to #3 711.06 M00.9   3. Infection of total knee replacement, subsequent encounter V58.89 T84.50XD   4. ESRD on dialysis MWF 585.6 N18.6    V45.11 Z99.2   5. Diabetes mellitus with renal manifestations, controlled 250.40 E11.21     --start allegra 151m daily for seasonal allergy  --use saline nasal spray prn  --complete vanco/zosyn per Ortho  --f/u with ortho as scheduled  --f/u with HD as scheduled MWF  --cont other meds as ordered  --keep appt next month as scheduled   Shamikia Linskey S. CPerlie Gold PHenry Ford [facility] Hospitaland Adult Medicine [address] [place] [id number]([phone] (Monday-Friday 8 AM - 5 PM) ([phone] 5 PM and follow prompts

## 2014-09-01 ENCOUNTER — Encounter (HOSPITAL_COMMUNITY): Payer: Self-pay

## 2014-09-01 ENCOUNTER — Emergency Department (HOSPITAL_COMMUNITY): Payer: Commercial Managed Care - HMO

## 2014-09-01 ENCOUNTER — Emergency Department (HOSPITAL_COMMUNITY)
Admission: EM | Admit: 2014-09-01 | Discharge: 2014-09-01 | Disposition: A | Discharge status: Home / Self Care | Payer: Commercial Managed Care - HMO | Attending: Emergency Medicine | Admitting: Emergency Medicine

## 2014-09-01 DIAGNOSIS — G609 Hereditary and idiopathic neuropathy, unspecified: Secondary | ICD-10-CM | POA: Insufficient documentation

## 2014-09-01 DIAGNOSIS — E559 Vitamin D deficiency, unspecified: Secondary | ICD-10-CM | POA: Insufficient documentation

## 2014-09-01 DIAGNOSIS — Z7982 Long term (current) use of aspirin: Secondary | ICD-10-CM | POA: Insufficient documentation

## 2014-09-01 DIAGNOSIS — Z862 Personal history of diseases of the blood and blood-forming organs and certain disorders involving the immune mechanism: Secondary | ICD-10-CM | POA: Diagnosis not present

## 2014-09-01 DIAGNOSIS — Z7901 Long term (current) use of anticoagulants: Secondary | ICD-10-CM | POA: Diagnosis not present

## 2014-09-01 DIAGNOSIS — E1140 Type 2 diabetes mellitus with diabetic neuropathy, unspecified: Secondary | ICD-10-CM | POA: Diagnosis not present

## 2014-09-01 DIAGNOSIS — L97509 Non-pressure chronic ulcer of other part of unspecified foot with unspecified severity: Secondary | ICD-10-CM | POA: Insufficient documentation

## 2014-09-01 DIAGNOSIS — N186 End stage renal disease: Secondary | ICD-10-CM | POA: Diagnosis not present

## 2014-09-01 DIAGNOSIS — I509 Heart failure, unspecified: Secondary | ICD-10-CM | POA: Diagnosis not present

## 2014-09-01 DIAGNOSIS — G4700 Insomnia, unspecified: Secondary | ICD-10-CM | POA: Diagnosis not present

## 2014-09-01 DIAGNOSIS — F419 Anxiety disorder, unspecified: Secondary | ICD-10-CM | POA: Insufficient documentation

## 2014-09-01 DIAGNOSIS — Z8719 Personal history of other diseases of the digestive system: Secondary | ICD-10-CM | POA: Diagnosis not present

## 2014-09-01 DIAGNOSIS — Z79899 Other long term (current) drug therapy: Secondary | ICD-10-CM | POA: Diagnosis not present

## 2014-09-01 DIAGNOSIS — Z720 Tobacco use: Secondary | ICD-10-CM | POA: Diagnosis not present

## 2014-09-01 DIAGNOSIS — M1990 Unspecified osteoarthritis, unspecified site: Secondary | ICD-10-CM | POA: Insufficient documentation

## 2014-09-01 DIAGNOSIS — I120 Hypertensive chronic kidney disease with stage 5 chronic kidney disease or end stage renal disease: Secondary | ICD-10-CM | POA: Diagnosis not present

## 2014-09-01 DIAGNOSIS — Z992 Dependence on renal dialysis: Secondary | ICD-10-CM | POA: Insufficient documentation

## 2014-09-01 DIAGNOSIS — E11621 Type 2 diabetes mellitus with foot ulcer: Secondary | ICD-10-CM | POA: Insufficient documentation

## 2014-09-01 DIAGNOSIS — J40 Bronchitis, not specified as acute or chronic: Secondary | ICD-10-CM | POA: Insufficient documentation

## 2014-09-01 DIAGNOSIS — Z8673 Personal history of transient ischemic attack (TIA), and cerebral infarction without residual deficits: Secondary | ICD-10-CM | POA: Diagnosis not present

## 2014-09-01 DIAGNOSIS — G8929 Other chronic pain: Secondary | ICD-10-CM | POA: Insufficient documentation

## 2014-09-01 DIAGNOSIS — Z8701 Personal history of pneumonia (recurrent): Secondary | ICD-10-CM | POA: Diagnosis not present

## 2014-09-01 DIAGNOSIS — R0602 Shortness of breath: Secondary | ICD-10-CM | POA: Diagnosis present

## 2014-09-01 DIAGNOSIS — I252 Old myocardial infarction: Secondary | ICD-10-CM | POA: Insufficient documentation

## 2014-09-01 HISTORY — DX: Disorder of kidney and ureter, unspecified: N28.9

## 2014-09-01 LAB — CBC
HEMATOCRIT: 32.2 % — AB (ref 39.0–52.0)
Hemoglobin: 9.9 g/dL — ABNORMAL LOW (ref 13.0–17.0)
MCH: 25.8 pg — ABNORMAL LOW (ref 26.0–34.0)
MCHC: 30.7 g/dL (ref 30.0–36.0)
MCV: 83.9 fL (ref 78.0–100.0)
PLATELETS: 281 10*3/uL (ref 150–400)
RBC: 3.84 MIL/uL — ABNORMAL LOW (ref 4.22–5.81)
RDW: 17.6 % — AB (ref 11.5–15.5)
WBC: 6.4 10*3/uL (ref 4.0–10.5)

## 2014-09-01 LAB — BASIC METABOLIC PANEL
Anion gap: 11 (ref 5–15)
BUN: 23 mg/dL — AB (ref 6–20)
CO2: 27 mmol/L (ref 22–32)
Calcium: 9.2 mg/dL (ref 8.9–10.3)
Chloride: 99 mmol/L — ABNORMAL LOW (ref 101–111)
Creatinine, Ser: 4.5 mg/dL — ABNORMAL HIGH (ref 0.61–1.24)
GFR calc Af Amer: 16 mL/min — ABNORMAL LOW (ref >60)
GFR, EST NON AFRICAN AMERICAN: 13 mL/min — AB (ref >60)
GLUCOSE: 118 mg/dL — AB (ref 65–99)
POTASSIUM: 3.8 mmol/L (ref 3.5–5.1)
Sodium: 137 mmol/L (ref 135–145)

## 2014-09-01 LAB — I-STAT TROPONIN, ED: Troponin i, poc: 0.01 ng/mL (ref 0.00–0.08)

## 2014-09-01 LAB — BRAIN NATRIURETIC PEPTIDE: B NATRIURETIC PEPTIDE 5: 454.5 pg/mL — AB (ref 0.0–100.0)

## 2014-09-01 MED ORDER — TRAMADOL HCL 50 MG PO TABS
50.0000 mg | ORAL_TABLET | Freq: Once | ORAL | Status: AC
  Administered 2014-09-01: 50 mg via ORAL
  Filled 2014-09-01: qty 1

## 2014-09-01 MED ORDER — IPRATROPIUM BROMIDE 0.02 % IN SOLN
0.5000 mg | Freq: Once | RESPIRATORY_TRACT | Status: AC
  Administered 2014-09-01: 0.5 mg via RESPIRATORY_TRACT
  Filled 2014-09-01: qty 2.5

## 2014-09-01 MED ORDER — PREDNISONE 20 MG PO TABS
60.0000 mg | ORAL_TABLET | Freq: Once | ORAL | Status: AC
  Administered 2014-09-01: 60 mg via ORAL
  Filled 2014-09-01: qty 3

## 2014-09-01 MED ORDER — ALBUTEROL SULFATE HFA 108 (90 BASE) MCG/ACT IN AERS
1.0000 | INHALATION_SPRAY | RESPIRATORY_TRACT | Status: DC | PRN
  Administered 2014-09-01: 2 via RESPIRATORY_TRACT
  Filled 2014-09-01: qty 6.7

## 2014-09-01 MED ORDER — ALBUTEROL SULFATE (2.5 MG/3ML) 0.083% IN NEBU
5.0000 mg | INHALATION_SOLUTION | Freq: Once | RESPIRATORY_TRACT | Status: AC
  Administered 2014-09-01: 5 mg via RESPIRATORY_TRACT
  Filled 2014-09-01: qty 6

## 2014-09-01 MED ORDER — PREDNISONE 20 MG PO TABS: 40.0000 mg | ORAL_TABLET | Freq: Every day | ORAL | Status: DC

## 2014-09-01 MED ORDER — IOHEXOL 350 MG/ML SOLN
75.0000 mL | Freq: Once | INTRAVENOUS | Status: AC | PRN
  Administered 2014-09-01: 75 mL via INTRAVENOUS

## 2014-09-01 NOTE — ED Provider Notes (Addendum)
CSN: [id number]     Arrival date & time 09/01/14  1249 History   First MD Initiated Contact with Patient 09/01/14 1504     Chief Complaint  Patient presents with  . Shortness of Breath     (Consider location/radiation/quality/duration/timing/severity/associated sxs/prior Treatment) HPI Comments: Patient is a 54 year old male who has end-stage renal disease on dialysis dialyzes Monday Wednesday Friday, diabetes, CHF, MI who presents today with gradually worsening shortness of breath, cough, chest pain and white frothy sputum that started 3 days ago. He has a history of CHF in the past and states that he had similar symptoms with that however his weight is down from normal.  Patient has noticed orthopnea, exertional dyspnea where now he cannot go more than 10 feet without having to sit and rest. Cough started several days ago when he initially tried an inhaler and was not helpful. He occasionally gets a low-grade fever but denies any consistent fever over the last 3 days. The cough is getting stronger and it does make the chest pain worse. Her chest pain is across the center of his chest worse with deep breathing and coughing. Over the last 2 days he has noted more swelling in his ankles.  Of note patient recently had removal of his knee hardware at the beginning of August. He has had improvement in knee pain and currently is still taking vancomycin and Fortaz. He has had a pneumonia shot.  Patient is a 54 y.o. male presenting with shortness of breath. The history is provided by the patient.  Shortness of Breath Severity:  Severe Onset quality:  Gradual Duration:  3 days Timing:  Constant Progression:  Worsening Chronicity:  Recurrent Context comment:  Started spontaneously Relieved by:  Rest and sitting up Worsened by:  Exertion and coughing (Lying flat) Ineffective treatments:  Inhaler Associated symptoms: cough, sputum production and wheezing   Associated symptoms: no abdominal pain and  no vomiting   Associated symptoms comment:  Occasional low-grade fever but nothing sustained Risk factors: recent surgery and tobacco use   Risk factors comment:  End-stage renal disease on dialysis, CHF, tobacco use, recent surgery   Past Medical History  Diagnosis Date  . Polymyalgia rheumatica   . Hypertension   . Diabetic neuropathy   . Osteomyelitis of foot, left, acute   . Anxiety   . Insomnia, unspecified   . Unspecified vitamin D deficiency   . Anemia, unspecified   . Other chronic postoperative pain   . Unspecified hereditary and idiopathic peripheral neuropathy   . Allergy   . Unspecified osteomyelitis, site unspecified   . Long term (current) use of anticoagulants   . 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 Ulcer     diabetic foot   . Arthralgia 2010    polyarticular  . Hemorrhoids, internal 10/2011    small  . CHF (congestive heart failure)   . CHF (congestive heart failure) 07/25/2009    denies  . Hemodialysis access site with mature fistula   . Myocardial infarction 1995  . Pneumonia     "probably 4-5 times" (05/09/2014)  . Sleep apnea     "lost weight; no more problem" (05/09/2014)  . Type II diabetes mellitus dx'd 1995  . History of blood transfusion     "related to the anemia"  . GERD (gastroesophageal reflux disease)     hx "before I lost weight"  . ESRD (end stage renal disease) on dialysis     started 12/2012; "MWF;  [facility]"  . Stroke 01/10/06    denies residual on 05/09/2014  . Arthritis     "back, knees" (05/09/2014)  . Chronic lower back pain    Past Surgical History  Procedure Laterality Date  . Amputation  01/21/2012    Procedure: AMPUTATION RAY;  Surgeon: [name], MD;  Location: [place];  Service: Orthopedics;  Laterality: Left;  Left Foot 4th Ray Amputation  . Anterior cervical decomp/discectomy fusion  02/2011  . Knee arthroscopy Left 08-25-2012  . Toe amputation Bilateral     "I've lost 7 toes over the last 7 years"  (05/09/2014)  . Refractive surgery Bilateral   . Bascilic vein transposition Left 10/19/2012    Procedure: BASCILIC VEIN TRANSPOSITION;  Surgeon: Serafina Mitchell, MD;  Location: [place];  Service: Vascular;  Laterality: Left;  . Tonsillectomy    . Amputation Left 05/04/2013    Procedure: AMPUTATION DIGIT;  Surgeon: [name], MD;  Location: [place];  Service: Orthopedics;  Laterality: Left;  Left Great Toe Amputation at [facility]  . Toe surgery Left April 2015    Big toe removed on left foot.  . Total knee arthroplasty Left 04/10/2014    Procedure: TOTAL KNEE ARTHROPLASTY;  Surgeon: [name], MD;  Location: [place];  Service: Orthopedics;  Laterality: Left;  . Wound debridement Left 05/09/2014    Dehiscence Left Total Knee Arthroplasty Incision  . Back surgery    . Uvulopalatopharyngoplasty, tonsillectomy and septoplasty  ~ 1989  . I&d extremity Left 05/09/2014    Procedure: Irrigation and Debridement Left Knee and Closure of Total Knee Arthroplasty Incision;  Surgeon: [name], MD;  Location: [place];  Service: Orthopedics;  Laterality: Left;  . I&d knee with poly exchange Left 05/31/2014    Procedure: IRRIGATION AND DEBRIDEMENT LEFT KNEE, PLACE ANTIBIOTIC BEADS,  POLY EXCHANGE;  Surgeon: [name], MD;  Location: [place];  Service: Orthopedics;  Laterality: Left;  . Excisional total knee arthroplasty with antibiotic spacers Left 08/07/2014    Procedure: Replace Left Total Knee Arthroplasty,  Place Antibiotic Spacer;  Surgeon: [name], MD;  Location: [place];  Service: Orthopedics;  Laterality: Left;   Family History  Problem Relation Age of Onset  . Hypertension Mother   . Cancer Mother 77    Ovarian  . Heart disease Maternal Aunt   . Stroke Maternal Grandfather    Social History  Substance Use Topics  . Smoking status: Current Some Day Smoker -- 0.12 packs/day for 32 years    Types: Cigarettes  . Smokeless tobacco: Never Used     Comment: 3 -4 cig daily   . Alcohol Use: No     Review of Systems  Respiratory: Positive for cough, sputum production, shortness of breath and wheezing.   Gastrointestinal: Negative for vomiting and abdominal pain.  All other systems reviewed and are negative.     Allergies  Morphine and related and Tygacil  Home Medications   Prior to Admission medications   Medication Sig Start Date End Date Taking? Authorizing Provider  amitriptyline (ELAVIL) 50 MG tablet Take 1 tablet (50 mg total) by mouth at bedtime. 03/28/13  Yes Mahima Pandey, MD  amLODipine (NORVASC) 5 MG tablet Take 5 mg by mouth daily.  02/22/14  Yes Historical Provider, MD  aspirin EC 325 MG EC tablet Take 1 tablet (325 mg total) by mouth daily. 08/12/14  Yes [name], MD  cefTAZidime 2 g in dextrose 5 % 50  mL Inject 2 g into the vein every Monday, Wednesday, and Friday with hemodialysis. 08/12/14  Yes [name], MD  Cholecalciferol (VITAMIN D PO) Take 1 tablet by mouth every Monday, Wednesday, and Friday. Take at dialysis   Yes Historical Provider, MD  fexofenadine (ALLEGRA) 180 MG tablet Take 1 tablet (180 mg total) by mouth daily. 08/29/14  Yes Gildardo Cranker, DO  gabapentin (NEURONTIN) 100 MG capsule Take 400 mg by mouth at bedtime.  07/12/14  Yes Monica Carter, DO  glipiZIDE (GLUCOTROL) 5 MG tablet Take 1 tablet (5 mg total) by mouth daily before breakfast. 10/24/13  Yes Mahima Pandey, MD  ibuprofen (ADVIL,MOTRIN) 200 MG tablet Take 200 mg by mouth daily as needed (pain).   Yes Historical Provider, MD  sildenafil (VIAGRA) 25 MG tablet Take 2 tablets (50 mg total) by mouth daily as needed for erectile dysfunction. 06/27/13  Yes Mahima Bubba Camp, MD  traMADol (ULTRAM) 50 MG tablet Take 1 tablet (50 mg total) by mouth every 6 (six) hours as needed for moderate pain. 08/12/14  Yes [name], MD  vancomycin (VANCOCIN) 1 GM/200ML SOLN Inject 200 mLs (1,000 mg total) into the vein every Monday, Wednesday, and Friday with hemodialysis. 08/12/14  Yes [name], MD  zolpidem  (AMBIEN) 10 MG tablet Take 1 tablet (10 mg total) by mouth at bedtime as needed for sleep. 08/19/14  Yes Tiffany L Reed, DO   BP 180/95 mmHg  Pulse 90  Temp(Src) 98.2 F (36.8 C) (Oral)  Resp 19  Ht 5\' 11"  (1.803 m)  Wt 190 lb (86.183 kg)  BMI 26.51 kg/m2  SpO2 100% Physical Exam  Constitutional: He is oriented to person, place, and time. He appears well-developed and well-nourished. No distress.  HENT:  Head: Normocephalic and atraumatic.  Mouth/Throat: Oropharynx is clear and moist.  Eyes: Conjunctivae and EOM are normal. Pupils are equal, round, and reactive to light.  Neck: Normal range of motion. Neck supple.  Cardiovascular: Normal rate, regular rhythm and intact distal pulses.   No murmur heard. Pulmonary/Chest: Effort normal. No respiratory distress. He has wheezes. He has rhonchi. He has rales.  Abnormal breath sounds heard in all lung fields  Abdominal: Soft. He exhibits no distension. There is no tenderness. There is no rebound and no guarding.  Musculoskeletal: Normal range of motion. He exhibits edema. He exhibits no tenderness.  1+ pitting edema in the left lower ankle. Trace edema in the right ankle. Surgical scar over the left knee is healing without evidence of infection or dehiscence  Neurological: He is alert and oriented to person, place, and time.  Skin: Skin is warm and dry. No rash noted. No erythema.  Psychiatric: He has a normal mood and affect. His behavior is normal.  Nursing note and vitals reviewed.   ED Course  Procedures (including critical care time) Labs Review Labs Reviewed  BASIC METABOLIC PANEL - Abnormal; Notable for the following:    Chloride 99 (*)    Glucose, Bld 118 (*)    BUN 23 (*)    Creatinine, Ser 4.50 (*)    GFR calc non Af Amer 13 (*)    GFR calc Af Amer 16 (*)    All other components within normal limits  CBC - Abnormal; Notable for the following:    RBC 3.84 (*)    Hemoglobin 9.9 (*)    HCT 32.2 (*)    MCH 25.8 (*)    RDW  17.6 (*)    All other  components within normal limits  BRAIN NATRIURETIC PEPTIDE - Abnormal; Notable for the following:    B Natriuretic Peptide 454.5 (*)    All other components within normal limits  I-STAT TROPOININ, ED    Imaging Review Dg Chest 2 View  09/01/2014   CLINICAL DATA:  Two day history of cough and shortness of breath. Current history of stage 4 chronic kidney disease, hypertension, diabetes,  EXAM: CHEST  2 VIEW  COMPARISON:  07/14/2014 and earlier.  FINDINGS: Cardiac silhouette mildly enlarged, unchanged. Thoracic aorta are minimally atherosclerotic, unchanged. Hilar and mediastinal contours otherwise unremarkable. New near complete atelectasis of the right middle lobe. The volume loss associated with the atelectasis accounts for the eventration of the right anterior hemidiaphragm. Lungs otherwise clear. Bronchovascular markings normal. Mild pulmonary venous hypertension without overt edema. No visible pleural effusions. Degenerative changes involving the thoracic spine. Interposition of the hepatic flexure the colon between the liver and the right hemidiaphragm.  IMPRESSION: Near complete atelectasis of the right middle lobe, new since 07/14/2014. This may be due to mucous plugging, though an endobronchial lesion could cause atelectasis.   Electronically Signed   By: Evangeline Dakin M.D.   On: 09/01/2014 15:10   Ct Angio Chest Pe W/cm &/or Wo Cm  09/01/2014   CLINICAL DATA:  SOB for several daysPain with deep inspirationHx CHF, MI, ESRD, StrokeDialysis pt since 2014  EXAM: CT ANGIOGRAPHY CHEST WITH CONTRAST  TECHNIQUE: Multidetector CT imaging of the chest was performed using the standard protocol during bolus administration of intravenous contrast. Multiplanar CT image reconstructions and MIPs were obtained to evaluate the vascular anatomy.  CONTRAST:  26mL OMNIPAQUE IOHEXOL 350 MG/ML SOLN  COMPARISON:  Current chest radiograph.  FINDINGS: Angiographic study: No evidence of a  pulmonary embolus. Great vessels are normal in caliber. No aortic dissection and no significant plaque.  Thoracic inlet:  No masses or adenopathy.  Mediastinum and hila: Heart is mildly enlarged. There are mild to moderate coronary artery calcifications. No mediastinal or hilar masses or pathologically enlarged lymph nodes.  Lungs and pleura: There is opacity in the right middle lobe along the oblique fissure most consistent with atelectasis. The right middle lobe bronchi appear patent. There is milder reticular and linear opacity in the left upper lobe lingula and dependently in the lower lobes consistent with subsegmental atelectasis. There is also bronchial wall thickening in both lower lobes, left greater than right and in the left upper lobe lingula. This may be chronic or reflect acute bronchitis or a combination of both. There is no evidence of pulmonary edema. There is no pleural effusion or pneumothorax.  Limited upper abdomen:  No acute finding.  Musculoskeletal: Mild degenerative changes throughout the visualized spine. No osteoblastic or osteolytic lesions.  Review of the MIP images confirms the above findings.  IMPRESSION: 1. No evidence of a pulmonary embolism. 2. Dependent right middle lobe opacity most consistent with atelectasis. No evidence of a central bronchial obstruction. 3. Mild subsegmental atelectasis in the dependent lower lobes and left upper lobe lingula. 4. Bronchial wall thickening most evident in the lower lobes, left greater than right. This may be a chronic finding or reflect acute bronchitis. 5. No convincing pneumonia.  No pulmonary edema. 6. Mild cardiomegaly.  Coronary artery calcifications.   Electronically Signed   By: Lajean Manes M.D.   On: 09/01/2014 17:55   I have personally reviewed and evaluated these images and lab results as part of my medical decision-making.   EKG Interpretation  Date/Time:  Sunday September 01 2014 13:50:04 EDT Ventricular Rate:  89 PR  Interval:  166 QRS Duration: 64 QT Interval:  374 QTC Calculation: 455 R Axis:   66 Text Interpretation:  Normal sinus rhythm Normal ECG No significant change  since last tracing Confirmed by Maryan Rued  MD, Loree Fee ([id number]) on 09/01/2014  3:13:58 PM      MDM   Final diagnoses:  Bronchitis    Patient with multiple medical problems including MI, CHF and end-stage renal disease who dialyzes Monday Wednesday Friday. He recently had removal of his knee hardware due to chronic infection at the beginning of August and has continued taking vancomycin and Fortaz. He denies any new leg pain or redness. He has noted some mild swelling in his lower extremities over the last few days of missed significantly cough productive of frothy sputum and shortness of breath. He normally can walk wherever he wants and now is unable to walk 10 feet without getting short of breath.  Patient complains of orthopnea and exertional dyspnea however his dry weight has improved and he is down 5 pounds. He is urinating more than normal which is different than his prior CHF symptoms. Given patient's symptoms concern for possible blood clots as he recently had surgery and new leg swelling versus viral bronchitis versus CHF exacerbation versus COPD exacerbation as patient does smoke.  Patient has diffuse rhonchi and wheezing in all lung fields on exam however he is satting 100% on room air. He is afebrile with a normal white blood cell count. Hemoglobin is improved from baseline at 9.9. BMP without acute findings. Troponin is negative. Chest x-ray shows near complete atelectasis of the right middle lobe which is new which could be due to mucous plugging versus endobronchial lesions.  3:43 PM Patient given albuterol and Atrovent. Will do a CTA to rule out clot versus developing pneumonia.  6:41 PM CT is consistent with bronchitis and atelectasis but no evidence of opacity or pneumonia. No evidence of pulmonary edema. Patient sounds  significantly better after albuterol and Atrovent. Will give a second treatment and steroid. Finding discussed with the patient he has no further questions and he is comfortable going home. He is a diabetic but states his sugar is. Well controlled and he knows how to adjust meds to ensure her sugars remained controlled while he is on steroid's.  Blanchie Dessert, MD 09/01/14 1842  Blanchie Dessert, MD 09/01/14 [phone]

## 2014-09-01 NOTE — ED Notes (Signed)
Pt reports onset several days shortness of breath, pain in chest when taking deep breaths.  Onset last night cough, productive, foamy white pink tinged phlegm.  Pt talking in complete sentences.

## 2014-09-01 NOTE — ED Notes (Signed)
Pt. Left with all belongings. Discharge instructions were reviewed and all questions were answered.  

## 2014-09-04 LAB — FUNGUS CULTURE W SMEAR: FUNGAL SMEAR: NONE SEEN

## 2014-09-06 ENCOUNTER — Ambulatory Visit (INDEPENDENT_AMBULATORY_CARE_PROVIDER_SITE_OTHER): Payer: Commercial Managed Care - HMO | Admitting: Internal Medicine

## 2014-09-06 ENCOUNTER — Encounter: Payer: Self-pay | Admitting: Internal Medicine

## 2014-09-06 VITALS — BP 170/90 | HR 103 | Temp 97.9°F | Resp 20 | Ht 71.0 in | Wt 199.8 lb

## 2014-09-06 DIAGNOSIS — N186 End stage renal disease: Secondary | ICD-10-CM | POA: Diagnosis not present

## 2014-09-06 DIAGNOSIS — Z992 Dependence on renal dialysis: Secondary | ICD-10-CM

## 2014-09-06 DIAGNOSIS — J209 Acute bronchitis, unspecified: Secondary | ICD-10-CM

## 2014-09-06 DIAGNOSIS — I10 Essential (primary) hypertension: Secondary | ICD-10-CM | POA: Diagnosis not present

## 2014-09-06 DIAGNOSIS — J301 Allergic rhinitis due to pollen: Secondary | ICD-10-CM | POA: Diagnosis not present

## 2014-09-06 MED ORDER — IPRATROPIUM-ALBUTEROL 18-103 MCG/ACT IN AERO: INHALATION_SPRAY | RESPIRATORY_TRACT | Status: DC

## 2014-09-06 NOTE — Progress Notes (Signed)
[name], male   DOB: 1960/12/25, 54 y.o.   MRN: [id number]    Location:    [facility]   Place of Service:   OFFICE  Chief Complaint  Patient presents with  . Hospitalization Follow-up    Hospital follow-up needs nebulizer    HPI:  54 yo male seen today for ER f/u. He presented to the ED with SOB. CT chest revealed bronchitis and atelectasis. He improved after neb tx. He is requesting neb machine to use at home. He is taking prednisone taper. He continues to smoke about 3-4 cigarettes daily. He has ESRD/HD MWF. He was recently hospitalized for infected knee hardware.  He reports increased anxiety and insomnia since starting prednisone. He is on day 5 of 5. He noticed increased swelling since beginning med also  BP elevated today - he is not using walker/cane today. Left knee is very painful.   Past Medical History  Diagnosis Date  . Polymyalgia rheumatica   . Hypertension   . Diabetic neuropathy   . Osteomyelitis of foot, left, acute   . Anxiety   . Insomnia, unspecified   . Unspecified vitamin D deficiency   . Anemia, unspecified   . Other chronic postoperative pain   . Unspecified hereditary and idiopathic peripheral neuropathy   . Allergy   . Unspecified osteomyelitis, site unspecified   . Long term (current) use of anticoagulants   . 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 Ulcer     diabetic foot   . Arthralgia 2010    polyarticular  . Hemorrhoids, internal 10/2011    small  . CHF (congestive heart failure)   . CHF (congestive heart failure) 07/25/2009    denies  . Hemodialysis access site with mature fistula   . Myocardial infarction 1995  . Pneumonia     "probably 4-5 times" (05/09/2014)  . Sleep apnea     "lost weight; no more problem" (05/09/2014)  . Type II diabetes mellitus dx'd 1995  . History of blood transfusion     "related to the anemia"  . GERD (gastroesophageal reflux disease)     hx "before I lost weight"  . ESRD (end stage  renal disease) on dialysis     started 12/2012; "MWF; [facility]"  . Stroke 01/10/06    denies residual on 05/09/2014  . Arthritis     "back, knees" (05/09/2014)  . Chronic lower back pain   . Renal insufficiency     Past Surgical History  Procedure Laterality Date  . Amputation  01/21/2012    Procedure: AMPUTATION RAY;  Surgeon: [name], MD;  Location: [place];  Service: Orthopedics;  Laterality: Left;  Left Foot 4th Ray Amputation  . Anterior cervical decomp/discectomy fusion  02/2011  . Knee arthroscopy Left 08-25-2012  . Toe amputation Bilateral     "I've lost 7 toes over the last 7 years" (05/09/2014)  . Refractive surgery Bilateral   . Bascilic vein transposition Left 10/19/2012    Procedure: BASCILIC VEIN TRANSPOSITION;  Surgeon: Serafina Mitchell, MD;  Location: [facility];  Service: Vascular;  Laterality: Left;  . Tonsillectomy    . Amputation Left 05/04/2013    Procedure: AMPUTATION DIGIT;  Surgeon: [name], MD;  Location: [facility];  Service: Orthopedics;  Laterality: Left;  Left Great Toe Amputation at MTP  . Toe surgery Left April 2015    Big toe removed on left foot.  . Total knee arthroplasty Left 04/10/2014  Procedure: TOTAL KNEE ARTHROPLASTY;  Surgeon: [name], MD;  Location: [facility];  Service: Orthopedics;  Laterality: Left;  . Wound debridement Left 05/09/2014    Dehiscence Left Total Knee Arthroplasty Incision  . Back surgery    . Uvulopalatopharyngoplasty, tonsillectomy and septoplasty  ~ 1989  . I&d extremity Left 05/09/2014    Procedure: Irrigation and Debridement Left Knee and Closure of Total Knee Arthroplasty Incision;  Surgeon: [name], MD;  Location: [facility];  Service: Orthopedics;  Laterality: Left;  . I&d knee with poly exchange Left 05/31/2014    Procedure: IRRIGATION AND DEBRIDEMENT LEFT KNEE, PLACE ANTIBIOTIC BEADS,  POLY EXCHANGE;  Surgeon: [name], MD;  Location: [facility];  Service: Orthopedics;  Laterality: Left;  . Excisional total knee  arthroplasty with antibiotic spacers Left 08/07/2014    Procedure: Replace Left Total Knee Arthroplasty,  Place Antibiotic Spacer;  Surgeon: [name], MD;  Location: [facility];  Service: Orthopedics;  Laterality: Left;    Patient Care Team: Gildardo Cranker, DO as PCP - General (Internal Medicine)  Social History   Social History  . Marital Status: Married    Spouse Name: N/A  . Number of Children: N/A  . Years of Education: N/A   Occupational History  . Not on file.   Social History Main Topics  . Smoking status: Current Some Day Smoker -- 0.12 packs/day for 32 years    Types: Cigarettes  . Smokeless tobacco: Never Used     Comment: 3 -4 cig daily   . Alcohol Use: No  . Drug Use: No  . Sexual Activity: Yes   Other Topics Concern  . Not on file   Social History Narrative     reports that he has been smoking Cigarettes.  He has a 3.84 pack-year smoking history. He has never used smokeless tobacco. He reports that he does not drink alcohol or use illicit drugs.  Allergies  Allergen Reactions  . Morphine And Related Other (See Comments)    hallucinations  . Tygacil [Tigecycline] Other (See Comments)    Makes him feel crazy    Medications: Patient's Medications  New Prescriptions   No medications on file  Previous Medications   AMITRIPTYLINE (ELAVIL) 50 MG TABLET    Take 1 tablet (50 mg total) by mouth at bedtime.   AMLODIPINE (NORVASC) 5 MG TABLET    Take 5 mg by mouth daily.    ASPIRIN EC 325 MG EC TABLET    Take 1 tablet (325 mg total) by mouth daily.   CEFTAZIDIME 2 G IN DEXTROSE 5 % 50 ML    Inject 2 g into the vein every Monday, Wednesday, and Friday with hemodialysis.   CHOLECALCIFEROL (VITAMIN D PO)    Take 1 tablet by mouth every Monday, Wednesday, and Friday. Take at dialysis   FEXOFENADINE (ALLEGRA) 180 MG TABLET    Take 1 tablet (180 mg total) by mouth daily.   GABAPENTIN (NEURONTIN) 100 MG CAPSULE    Take 400 mg by mouth at bedtime.    GLIPIZIDE  (GLUCOTROL) 5 MG TABLET    Take 1 tablet (5 mg total) by mouth daily before breakfast.   IBUPROFEN (ADVIL,MOTRIN) 200 MG TABLET    Take 200 mg by mouth daily as needed (pain).   PREDNISONE (DELTASONE) 20 MG TABLET    Take 2 tablets (40 mg total) by mouth daily.   SILDENAFIL (VIAGRA) 25 MG TABLET    Take 2 tablets (50 mg total) by  mouth daily as needed for erectile dysfunction.   TRAMADOL (ULTRAM) 50 MG TABLET    Take 1 tablet (50 mg total) by mouth every 6 (six) hours as needed for moderate pain.   VANCOMYCIN (VANCOCIN) 1 GM/200ML SOLN    Inject 200 mLs (1,000 mg total) into the vein every Monday, Wednesday, and Friday with hemodialysis.   ZOLPIDEM (AMBIEN) 10 MG TABLET    Take 1 tablet (10 mg total) by mouth at bedtime as needed for sleep.  Modified Medications   No medications on file  Discontinued Medications   No medications on file    Review of Systems  Constitutional: Negative for chills, activity change and fatigue.  HENT: Negative for sore throat and trouble swallowing.   Eyes: Negative for visual disturbance.  Respiratory: Positive for wheezing. Negative for cough, chest tightness and shortness of breath.   Cardiovascular: Negative for chest pain, palpitations and leg swelling.  Gastrointestinal: Negative for nausea, vomiting, abdominal pain and blood in stool.  Genitourinary: Negative for urgency, frequency and difficulty urinating.  Musculoskeletal: Negative for arthralgias and gait problem.  Skin: Negative for rash.  Neurological: Negative for weakness and headaches.  Psychiatric/Behavioral: Positive for sleep disturbance and agitation. Negative for confusion. The patient is nervous/anxious.     Filed Vitals:   09/06/14 1045  BP: 178/100  Repeat by myself 170/90  Pulse: 103  Temp: 97.9 F (36.6 C)  TempSrc: Oral  Resp: 20  Height: 5' 11" (1.803 m)  Weight: 199 lb 12.8 oz (90.629 kg)  SpO2: 98%   Body mass index is 27.88 kg/(m^2).  Physical Exam  Constitutional:  He is oriented to person, place, and time. He appears well-developed and well-nourished. No distress.  HENT:  Mouth/Throat: Oropharynx is clear and moist. No oropharyngeal exudate.  Eyes: Pupils are equal, round, and reactive to light. No scleral icterus.  Cardiovascular: Regular rhythm.  Tachycardia present.   LE edema +1 pitting b/l. No calf TTP  Pulmonary/Chest: No respiratory distress. He has wheezes (end expiratory). He has rales.  Musculoskeletal: He exhibits edema and tenderness.  Left knee swelling and TTP. Gait is severely antalgic  Neurological: He is alert and oriented to person, place, and time. Gait abnormal.  Skin: Skin is warm and dry. No rash noted.  Psychiatric: He has a normal mood and affect. His behavior is normal. Thought content normal.     Labs reviewed: Admission on 09/01/2014, Discharged on 09/01/2014  Component Date Value Ref Range Status  . Sodium 09/01/2014 137  135 - 145 mmol/L Final  . Potassium 09/01/2014 3.8  3.5 - 5.1 mmol/L Final  . Chloride 09/01/2014 99* 101 - 111 mmol/L Final  . CO2 09/01/2014 27  22 - 32 mmol/L Final  . Glucose, Bld 09/01/2014 118* 65 - 99 mg/dL Final  . BUN 09/01/2014 23* 6 - 20 mg/dL Final  . Creatinine, Ser 09/01/2014 4.50* 0.61 - 1.24 mg/dL Final  . Calcium 09/01/2014 9.2  8.9 - 10.3 mg/dL Final  . GFR calc non Af Amer 09/01/2014 13* >60 mL/min Final  . GFR calc Af Amer 09/01/2014 16* >60 mL/min Final   Comment: (NOTE) The eGFR has been calculated using the CKD EPI equation. This calculation has not been validated in all clinical situations. eGFR's persistently <60 mL/min signify possible Chronic Kidney Disease.   . Anion gap 09/01/2014 11  5 - 15 Final  . WBC 09/01/2014 6.4  4.0 - 10.5 K/uL Final  . RBC 09/01/2014 3.84* 4.22 - 5.81 MIL/uL Final  .  Hemoglobin 09/01/2014 9.9* 13.0 - 17.0 g/dL Final  . HCT 09/01/2014 32.2* 39.0 - 52.0 % Final  . MCV 09/01/2014 83.9  78.0 - 100.0 fL Final  . MCH 09/01/2014 25.8* 26.0 -  34.0 pg Final  . MCHC 09/01/2014 30.7  30.0 - 36.0 g/dL Final  . RDW 09/01/2014 17.6* 11.5 - 15.5 % Final  . Platelets 09/01/2014 281  150 - 400 K/uL Final  . Troponin i, poc 09/01/2014 0.01  0.00 - 0.08 ng/mL Final  . Comment 3 09/01/2014          Final   Comment: Due to the release kinetics of cTnI, a negative result within the first hours of the onset of symptoms does not rule out myocardial infarction with certainty. If myocardial infarction is still suspected, repeat the test at appropriate intervals.   . B Natriuretic Peptide 09/01/2014 454.5* 0.0 - 100.0 pg/mL Final  Admission on 08/07/2014, Discharged on 08/12/2014  No results displayed because visit has over 200 results.    Admission on 07/13/2014, Discharged on 07/14/2014  Component Date Value Ref Range Status  . WBC 07/13/2014 13.5* 4.0 - 10.5 K/uL Final  . RBC 07/13/2014 4.01* 4.22 - 5.81 MIL/uL Final  . Hemoglobin 07/13/2014 10.5* 13.0 - 17.0 g/dL Final  . HCT 07/13/2014 34.5* 39.0 - 52.0 % Final  . MCV 07/13/2014 86.0  78.0 - 100.0 fL Final  . MCH 07/13/2014 26.2  26.0 - 34.0 pg Final  . MCHC 07/13/2014 30.4  30.0 - 36.0 g/dL Final  . RDW 07/13/2014 17.4* 11.5 - 15.5 % Final  . Platelets 07/13/2014 280  150 - 400 K/uL Final  . Neutrophils Relative % 07/13/2014 73  43 - 77 % Final  . Neutro Abs 07/13/2014 9.8* 1.7 - 7.7 K/uL Final  . Lymphocytes Relative 07/13/2014 19  12 - 46 % Final  . Lymphs Abs 07/13/2014 2.6  0.7 - 4.0 K/uL Final  . Monocytes Relative 07/13/2014 7  3 - 12 % Final  . Monocytes Absolute 07/13/2014 0.9  0.1 - 1.0 K/uL Final  . Eosinophils Relative 07/13/2014 1  0 - 5 % Final  . Eosinophils Absolute 07/13/2014 0.1  0.0 - 0.7 K/uL Final  . Basophils Relative 07/13/2014 0  0 - 1 % Final  . Basophils Absolute 07/13/2014 0.0  0.0 - 0.1 K/uL Final  . Sodium 07/13/2014 137  135 - 145 mmol/L Final  . Potassium 07/13/2014 5.5* 3.5 - 5.1 mmol/L Final  . Chloride 07/13/2014 104  101 - 111 mmol/L Final  .  CO2 07/13/2014 20* 22 - 32 mmol/L Final  . Glucose, Bld 07/13/2014 253* 65 - 99 mg/dL Final  . BUN 07/13/2014 39* 6 - 20 mg/dL Final  . Creatinine, Ser 07/13/2014 6.96* 0.61 - 1.24 mg/dL Final  . Calcium 07/13/2014 8.7* 8.9 - 10.3 mg/dL Final  . Total Protein 07/13/2014 8.5* 6.5 - 8.1 g/dL Final  . Albumin 07/13/2014 2.8* 3.5 - 5.0 g/dL Final  . AST 07/13/2014 19  15 - 41 U/L Final  . ALT 07/13/2014 12* 17 - 63 U/L Final  . Alkaline Phosphatase 07/13/2014 89  38 - 126 U/L Final  . Total Bilirubin 07/13/2014 0.6  0.3 - 1.2 mg/dL Final  . GFR calc non Af Amer 07/13/2014 8* >60 mL/min Final  . GFR calc Af Amer 07/13/2014 9* >60 mL/min Final   Comment: (NOTE) The eGFR has been calculated using the CKD EPI equation. This calculation has not been validated in all clinical situations.  eGFR's persistently <60 mL/min signify possible Chronic Kidney Disease.   . Anion gap 07/13/2014 13  5 - 15 Final  . Lactic Acid, Venous 07/13/2014 2.26* 0.5 - 2.0 mmol/L Final  . Comment 07/13/2014 NOTIFIED PHYSICIAN   Final  . Troponin i, poc 07/13/2014 0.02  0.00 - 0.08 ng/mL Final  . Comment 3 07/13/2014          Final   Comment: Due to the release kinetics of cTnI, a negative result within the first hours of the onset of symptoms does not rule out myocardial infarction with certainty. If myocardial infarction is still suspected, repeat the test at appropriate intervals.   . Glucose-Capillary 07/13/2014 227* 65 - 99 mg/dL Final  . Sodium 07/13/2014 138  135 - 145 mmol/L Final  . Potassium 07/13/2014 5.4* 3.5 - 5.1 mmol/L Final  . Chloride 07/13/2014 107  101 - 111 mmol/L Final  . BUN 07/13/2014 40* 6 - 20 mg/dL Final  . Creatinine, Ser 07/13/2014 7.10* 0.61 - 1.24 mg/dL Final  . Glucose, Bld 07/13/2014 252* 65 - 99 mg/dL Final  . Calcium, Ion 07/13/2014 1.11* 1.12 - 1.23 mmol/L Final  . TCO2 07/13/2014 20  0 - 100 mmol/L Final  . Hemoglobin 07/13/2014 11.2* 13.0 - 17.0 g/dL Final  . HCT 07/13/2014  33.0* 39.0 - 52.0 % Final  . pH, Arterial 07/13/2014 7.376  7.350 - 7.450 Final  . pCO2 arterial 07/13/2014 33.5* 35.0 - 45.0 mmHg Final  . pO2, Arterial 07/13/2014 72.0* 80.0 - 100.0 mmHg Final  . Bicarbonate 07/13/2014 19.6* 20.0 - 24.0 mEq/L Final  . TCO2 07/13/2014 21  0 - 100 mmol/L Final  . O2 Saturation 07/13/2014 94.0   Final  . Acid-base deficit 07/13/2014 5.0* 0.0 - 2.0 mmol/L Final  . Patient temperature 07/13/2014 98.6 F   Final  . Collection site 07/13/2014 BRACHIAL ARTERY   Final  . Drawn by 07/13/2014 RT   Final  . Sample type 07/13/2014 ARTERIAL   Final  . Specimen Description 07/13/2014 BLOOD RIGHT ARM   Final  . Special Requests 07/13/2014 BOTTLES DRAWN AEROBIC AND ANAEROBIC 5CC EA   Final  . Culture 07/13/2014 NO GROWTH 5 DAYS   Final  . Report Status 07/13/2014 07/18/2014 FINAL   Final  . Specimen Description 07/13/2014 BLOOD RIGHT HAND   Final  . Special Requests 07/13/2014 BOTTLES DRAWN AEROBIC ONLY 10CC   Final  . Culture 07/13/2014 NO GROWTH 5 DAYS   Final  . Report Status 07/13/2014 07/19/2014 FINAL   Final  . Lactic Acid, Venous 07/13/2014 1.43  0.5 - 2.0 mmol/L Final  . Hgb A1c MFr Bld 07/14/2014 5.5  4.8 - 5.6 % Final   Comment: (NOTE)         Pre-diabetes: 5.7 - 6.4         Diabetes: >6.4         Glycemic control for adults with diabetes: <7.0   . Mean Plasma Glucose 07/14/2014 111   Final   Comment: (NOTE) Performed At: [facility] [place], [place] [id number] Lindon Romp MD QA:[id number]   . Sodium 07/14/2014 137  135 - 145 mmol/L Final  . Potassium 07/14/2014 4.2  3.5 - 5.1 mmol/L Final  . Chloride 07/14/2014 101  101 - 111 mmol/L Final  . CO2 07/14/2014 22  22 - 32 mmol/L Final  . Glucose, Bld 07/14/2014 205* 65 - 99 mg/dL Final  . BUN 07/14/2014 24* 6 - 20 mg/dL Final  .  Creatinine, Ser 07/14/2014 4.32* 0.61 - 1.24 mg/dL Final   DELTA CHECK NOTED  . Calcium 07/14/2014 8.5* 8.9 - 10.3 mg/dL Final  . GFR calc non Af  Amer 07/14/2014 14* >60 mL/min Final  . GFR calc Af Amer 07/14/2014 16* >60 mL/min Final   Comment: (NOTE) The eGFR has been calculated using the CKD EPI equation. This calculation has not been validated in all clinical situations. eGFR's persistently <60 mL/min signify possible Chronic Kidney Disease.   . Anion gap 07/14/2014 14  5 - 15 Final  . WBC 07/14/2014 8.9  4.0 - 10.5 K/uL Final  . RBC 07/14/2014 3.41* 4.22 - 5.81 MIL/uL Final  . Hemoglobin 07/14/2014 9.0* 13.0 - 17.0 g/dL Final  . HCT 07/14/2014 28.9* 39.0 - 52.0 % Final  . MCV 07/14/2014 84.8  78.0 - 100.0 fL Final  . MCH 07/14/2014 26.4  26.0 - 34.0 pg Final  . MCHC 07/14/2014 31.1  30.0 - 36.0 g/dL Final  . RDW 07/14/2014 17.4* 11.5 - 15.5 % Final  . Platelets 07/14/2014 242  150 - 400 K/uL Final  . MRSA by PCR 07/14/2014 NEGATIVE  NEGATIVE Final   Comment:        The GeneXpert MRSA Assay (FDA approved for NASAL specimens only), is one component of a comprehensive MRSA colonization surveillance program. It is not intended to diagnose MRSA infection nor to guide or monitor treatment for MRSA infections.   . Glucose-Capillary 07/14/2014 187* 65 - 99 mg/dL Final  . Glucose-Capillary 07/14/2014 167* 65 - 99 mg/dL Final  Admission on 05/31/2014, Discharged on 06/08/2014  No results displayed because visit has over 200 results.      Dg Chest 2 View  09/01/2014   CLINICAL DATA:  Two day history of cough and shortness of breath. Current history of stage 4 chronic kidney disease, hypertension, diabetes,  EXAM: CHEST  2 VIEW  COMPARISON:  07/14/2014 and earlier.  FINDINGS: Cardiac silhouette mildly enlarged, unchanged. Thoracic aorta are minimally atherosclerotic, unchanged. Hilar and mediastinal contours otherwise unremarkable. New near complete atelectasis of the right middle lobe. The volume loss associated with the atelectasis accounts for the eventration of the right anterior hemidiaphragm. Lungs otherwise clear.  Bronchovascular markings normal. Mild pulmonary venous hypertension without overt edema. No visible pleural effusions. Degenerative changes involving the thoracic spine. Interposition of the hepatic flexure the colon between the liver and the right hemidiaphragm.  IMPRESSION: Near complete atelectasis of the right middle lobe, new since 07/14/2014. This may be due to mucous plugging, though an endobronchial lesion could cause atelectasis.   Electronically Signed   By: Evangeline Dakin M.D.   On: 09/01/2014 15:10   Ct Angio Chest Pe W/cm &/or Wo Cm  09/01/2014   CLINICAL DATA:  SOB for several daysPain with deep inspirationHx CHF, MI, ESRD, StrokeDialysis pt since 2014  EXAM: CT ANGIOGRAPHY CHEST WITH CONTRAST  TECHNIQUE: Multidetector CT imaging of the chest was performed using the standard protocol during bolus administration of intravenous contrast. Multiplanar CT image reconstructions and MIPs were obtained to evaluate the vascular anatomy.  CONTRAST:  62m OMNIPAQUE IOHEXOL 350 MG/ML SOLN  COMPARISON:  Current chest radiograph.  FINDINGS: Angiographic study: No evidence of a pulmonary embolus. Great vessels are normal in caliber. No aortic dissection and no significant plaque.  Thoracic inlet:  No masses or adenopathy.  Mediastinum and hila: Heart is mildly enlarged. There are mild to moderate coronary artery calcifications. No mediastinal or hilar masses or pathologically enlarged lymph nodes.  Lungs  and pleura: There is opacity in the right middle lobe along the oblique fissure most consistent with atelectasis. The right middle lobe bronchi appear patent. There is milder reticular and linear opacity in the left upper lobe lingula and dependently in the lower lobes consistent with subsegmental atelectasis. There is also bronchial wall thickening in both lower lobes, left greater than right and in the left upper lobe lingula. This may be chronic or reflect acute bronchitis or a combination of both. There is  no evidence of pulmonary edema. There is no pleural effusion or pneumothorax.  Limited upper abdomen:  No acute finding.  Musculoskeletal: Mild degenerative changes throughout the visualized spine. No osteoblastic or osteolytic lesions.  Review of the MIP images confirms the above findings.  IMPRESSION: 1. No evidence of a pulmonary embolism. 2. Dependent right middle lobe opacity most consistent with atelectasis. No evidence of a central bronchial obstruction. 3. Mild subsegmental atelectasis in the dependent lower lobes and left upper lobe lingula. 4. Bronchial wall thickening most evident in the lower lobes, left greater than right. This may be a chronic finding or reflect acute bronchitis. 5. No convincing pneumonia.  No pulmonary edema. 6. Mild cardiomegaly.  Coronary artery calcifications.   Electronically Signed   By: Lajean Manes M.D.   On: 09/01/2014 17:55   Dg Knee Left Port  08/07/2014   CLINICAL DATA:  Revision of total knee arthroplasty, with antibiotic spacer  EXAM: PORTABLE LEFT KNEE - 1-2 VIEW  COMPARISON:  05/09/2014  FINDINGS: The total knee arthroplasty hardware has been removed. There is an antibiotic spacer. Good alignment. No bony destruction. No fracture or other immediate complication.  IMPRESSION: TKA hardware removal with placement of antibiotic spacer. No immediate complication.   Electronically Signed   By: Andreas Newport M.D.   On: 08/07/2014 19:22     Assessment/Plan   ICD-9-CM ICD-10-CM   1. Acute bronchitis, unspecified organism - improving 466.0 J20.9 albuterol-ipratropium (COMBIVENT) 18-103 MCG/ACT inhaler  2. Allergic rhinitis due to pollen - stable 477.0 J30.1   3. Essential hypertension, benign - elevated today due to pain 401.1 I10   4. ESRD on dialysis - stable 585.6 N18.6    V45.11 Z99.2    Use walker when ambulating  Smoking cessation discussed and highly urged  Recommend gargling after each use of inhaler  Continue other medications as  ordered  Follow up as scheduled  Emryn Flanery S. Perlie Gold  [facility] [address] [place], [place] [address] [phone] Cell (Monday-Friday 8 AM - 5 PM) [phone] After 5 PM and follow prompts

## 2014-09-06 NOTE — Patient Instructions (Signed)
Recommend gargling after each use of inhaler  Continue other medications as ordered  Follow up as scheduled

## 2014-09-17 ENCOUNTER — Other Ambulatory Visit: Payer: Self-pay | Admitting: *Deleted

## 2014-09-17 MED ORDER — ZOLPIDEM TARTRATE 10 MG PO TABS: 10.0000 mg | ORAL_TABLET | Freq: Every evening | ORAL | Status: DC | PRN

## 2014-09-17 NOTE — Telephone Encounter (Signed)
[facility] 

## 2014-09-19 LAB — AFB CULTURE WITH SMEAR (NOT AT ARMC): ACID FAST SMEAR: NONE SEEN

## 2014-09-27 ENCOUNTER — Ambulatory Visit: Payer: Commercial Managed Care - HMO | Admitting: Internal Medicine

## 2014-09-30 ENCOUNTER — Other Ambulatory Visit (HOSPITAL_COMMUNITY): Payer: Self-pay | Admitting: Orthopedic Surgery

## 2014-10-07 ENCOUNTER — Encounter (HOSPITAL_COMMUNITY): Payer: Self-pay

## 2014-10-07 ENCOUNTER — Encounter (HOSPITAL_COMMUNITY)
Admission: RE | Admit: 2014-10-07 | Discharge: 2014-10-07 | Disposition: A | Discharge status: Home / Self Care | Payer: Commercial Managed Care - HMO | Source: Ambulatory Visit | Attending: Orthopedic Surgery | Admitting: Orthopedic Surgery

## 2014-10-07 DIAGNOSIS — Z01818 Encounter for other preprocedural examination: Secondary | ICD-10-CM | POA: Diagnosis not present

## 2014-10-07 DIAGNOSIS — K219 Gastro-esophageal reflux disease without esophagitis: Secondary | ICD-10-CM | POA: Insufficient documentation

## 2014-10-07 DIAGNOSIS — R918 Other nonspecific abnormal finding of lung field: Secondary | ICD-10-CM | POA: Insufficient documentation

## 2014-10-07 DIAGNOSIS — Z96652 Presence of left artificial knee joint: Secondary | ICD-10-CM | POA: Insufficient documentation

## 2014-10-07 DIAGNOSIS — E1122 Type 2 diabetes mellitus with diabetic chronic kidney disease: Secondary | ICD-10-CM | POA: Diagnosis not present

## 2014-10-07 DIAGNOSIS — F17200 Nicotine dependence, unspecified, uncomplicated: Secondary | ICD-10-CM | POA: Insufficient documentation

## 2014-10-07 DIAGNOSIS — E785 Hyperlipidemia, unspecified: Secondary | ICD-10-CM | POA: Diagnosis not present

## 2014-10-07 DIAGNOSIS — Z01812 Encounter for preprocedural laboratory examination: Secondary | ICD-10-CM | POA: Insufficient documentation

## 2014-10-07 DIAGNOSIS — Z992 Dependence on renal dialysis: Secondary | ICD-10-CM | POA: Insufficient documentation

## 2014-10-07 DIAGNOSIS — Z79899 Other long term (current) drug therapy: Secondary | ICD-10-CM | POA: Diagnosis not present

## 2014-10-07 DIAGNOSIS — N186 End stage renal disease: Secondary | ICD-10-CM | POA: Insufficient documentation

## 2014-10-07 DIAGNOSIS — Z8673 Personal history of transient ischemic attack (TIA), and cerebral infarction without residual deficits: Secondary | ICD-10-CM | POA: Diagnosis not present

## 2014-10-07 DIAGNOSIS — I120 Hypertensive chronic kidney disease with stage 5 chronic kidney disease or end stage renal disease: Secondary | ICD-10-CM | POA: Diagnosis not present

## 2014-10-07 HISTORY — DX: Cough, unspecified: R05.9

## 2014-10-07 HISTORY — DX: Cough: R05

## 2014-10-07 LAB — PROTIME-INR
INR: 1.15 (ref 0.00–1.49)
Prothrombin Time: 14.9 seconds (ref 11.6–15.2)

## 2014-10-07 LAB — CBC
HCT: 38 % — ABNORMAL LOW (ref 39.0–52.0)
HEMOGLOBIN: 11.8 g/dL — AB (ref 13.0–17.0)
MCH: 26.9 pg (ref 26.0–34.0)
MCHC: 31.1 g/dL (ref 30.0–36.0)
MCV: 86.6 fL (ref 78.0–100.0)
Platelets: 252 10*3/uL (ref 150–400)
RBC: 4.39 MIL/uL (ref 4.22–5.81)
RDW: 18.8 % — ABNORMAL HIGH (ref 11.5–15.5)
WBC: 12.4 10*3/uL — ABNORMAL HIGH (ref 4.0–10.5)

## 2014-10-07 LAB — COMPREHENSIVE METABOLIC PANEL
ALBUMIN: 2.9 g/dL — AB (ref 3.5–5.0)
ALK PHOS: 89 U/L (ref 38–126)
ALT: 9 U/L — ABNORMAL LOW (ref 17–63)
ANION GAP: 13 (ref 5–15)
AST: 16 U/L (ref 15–41)
BUN: 36 mg/dL — ABNORMAL HIGH (ref 6–20)
CALCIUM: 9.2 mg/dL (ref 8.9–10.3)
CHLORIDE: 99 mmol/L — AB (ref 101–111)
CO2: 25 mmol/L (ref 22–32)
Creatinine, Ser: 7.25 mg/dL — ABNORMAL HIGH (ref 0.61–1.24)
GFR calc Af Amer: 9 mL/min — ABNORMAL LOW (ref >60)
GFR calc non Af Amer: 8 mL/min — ABNORMAL LOW (ref >60)
GLUCOSE: 93 mg/dL (ref 65–99)
Potassium: 5.4 mmol/L — ABNORMAL HIGH (ref 3.5–5.1)
SODIUM: 137 mmol/L (ref 135–145)
Total Bilirubin: 0.5 mg/dL (ref 0.3–1.2)
Total Protein: 7.9 g/dL (ref 6.5–8.1)

## 2014-10-07 LAB — SURGICAL PCR SCREEN
MRSA, PCR: NEGATIVE
Staphylococcus aureus: NEGATIVE

## 2014-10-07 LAB — APTT: APTT: 28 s (ref 24–37)

## 2014-10-07 NOTE — Pre-Procedure Instructions (Signed)
Reise Scholes  10/07/2014      [facility] - [place], [place] - [address] [place] [address] Phone: [phone] Fax: [phone]  [facility] [place], [place] [address] Phone: [phone] Fax: [phone]  [id number] - [place], [place] AT [address] [place] [address] Phone: [phone] Fax: [phone]    Your procedure is scheduled on 10/11/2014  Report to [facility] Admitting at 9:45 A.M.  Call this number if you have problems the morning of surgery:  [phone]   Remember:  Do not eat food or drink liquids after midnight.  Take these medicines the morning of surgery with A SIP OF WATER : Amlodipine,   Do not wear jewelry   Do not wear lotions, powders, or perfumes.  You may wear deodorant.    Men may shave face and neck.   Do not bring valuables to the hospital.    [facility] is not responsible for any belongings or valuables.  Contacts, dentures or bridgework may not be worn into surgery.  Leave your suitcase in the car.  After surgery it may be brought to your room.  For patients admitted to the hospital, discharge time will be determined by your treatment team.  Patients discharged the day of surgery will not be allowed to drive home.   Name and phone number of your driver:   With family Special instructions: Special Instructions: [facility] - Preparing for Surgery  Before surgery, you can play an important role.  Because skin is not sterile, your skin needs to be as free of germs as possible.  You can reduce the number of germs on you skin by washing with CHG (chlorahexidine gluconate) soap before surgery.  CHG is an antiseptic cleaner which kills germs and bonds with the skin to continue killing germs even after washing.  Please DO NOT use if  you have an allergy to CHG or antibacterial soaps.  If your skin becomes reddened/irritated stop using the CHG and inform your nurse when you arrive at Short Stay.  Do not shave (including legs and underarms) for at least 48 hours prior to the first CHG shower.  You may shave your face.  Please follow these instructions carefully:   1.  Shower with CHG Soap the night before surgery and the  morning of Surgery.  2.  If you choose to wash your hair, wash your hair first as usual with your  normal shampoo.  3.  After you shampoo, rinse your hair and body thoroughly to remove the  Shampoo.  4.  Use CHG as you would any other liquid soap.  You can apply chg directly to the skin and wash gently with scrungie or a clean washcloth.  5.  Apply the CHG Soap to your body ONLY FROM THE NECK DOWN.    Do not use on open wounds or open sores.  Avoid contact with your eyes, ears, mouth and genitals (private parts).  Wash genitals (private parts)   with your normal soap.  6.  Wash thoroughly, paying special attention to the area where your surgery will be performed.  7.  Thoroughly rinse your body with warm water from the neck down.  8.  DO NOT shower/wash with your normal soap after using and rinsing off   the CHG Soap.  9.  Pat yourself dry with a clean towel.            10.  Wear clean pajamas.            11.  Place clean sheets on your bed the night of your first shower and do not sleep with pets.  Day of Surgery  Do not apply any lotions/deodorants the morning of surgery.  Please wear clean clothes to the hospital/surgery center.       How to Manage Your Diabetes Before Surgery   Why is it important to control my blood sugar before and after surgery?   Improving blood sugar levels before and after surgery helps healing and can limit problems.  A way of improving blood sugar control is eating a healthy diet by:  - Eating less sugar and carbohydrates  - Increasing activity/exercise  - Talk  with your doctor about reaching your blood sugar goals  High blood sugars (greater than 180 mg/dL) can raise your risk of infections and slow down your recovery so you will need to focus on controlling your diabetes during the weeks before surgery.  Make sure that the doctor who takes care of your diabetes knows about your planned surgery including the date and location.  How do I manage my blood sugars before surgery?   Check your blood sugar at least 4 times a day, 2 days before surgery to make sure that they are not too high or low.   Check your blood sugar the morning of your surgery when you wake up and every 2               hours until you get to the Short-Stay unit.  If your blood sugar is less than 70 mg/dL, you will need to treat for low blood sugar by:  Treat a low blood sugar (less than 70 mg/dL) with 1/2 cup of clear juice (cranberry or apple), 4 glucose tablets, OR glucose gel.  Recheck blood sugar in 15 minutes after treatment (to make sure it is greater than 70 mg/dL).  If blood sugar is not greater than 70 mg/dL on re-check, call [phone] for further instructions.   Report your blood sugar to the Short-Stay nurse when you get to Short-Stay.  References:  [facility], 2007 "How to Manage your Diabetes Before and After Surgery".  What do I do about my diabetes medications?   Do not take oral diabetes medicines (pills) the morning of surgery.                  Please read over the following fact sheets that you were given. Pain Booklet, Coughing and Deep Breathing, Blood Transfusion Information, MRSA Information and Surgical Site Infection Prevention

## 2014-10-07 NOTE — Progress Notes (Signed)
After pt. Left , call to [facility]- last HgbA1c- done 07-24-2014.  Will be done on DOS with ISTAT

## 2014-10-07 NOTE — Progress Notes (Signed)
Pt. Arrived late for his appt., call made to [facility], because of pt. Being late for his appt.

## 2014-10-07 NOTE — Progress Notes (Signed)
Call to [employer]. For guidance on whether a CXR needs repeating today at PAT appt.  Pt. Reports that he has a sinus infection, states all his congestion is in his head & he had a simple cough from drainage. As the visit unfolded it was determined that he should have a CXR today because he is coughing several times through out PAT appt. & when he coughs, his chest sounds tight. Pt. Reports that he will see Dr. Sharol Given tomorrow, he will also see Dr. Kandis Cocking  (PCP)due to sinus infection on 10/09/2014, he's hoping he will be given an antibiotic. Pt. Told to be sure to be clear with both MD's that he has this congestion /sinus infection.

## 2014-10-08 NOTE — Progress Notes (Addendum)
Anesthesia Chart Review:  Pt is 54 year old male scheduled for L total knee revision on 10/25/2014 with Dr. Sharol Given.   PCP is Dr. Bubba Camp. Nephrologist is Dr. Justin Mend.  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 Current smoker. BMI 27.5. S/p replace L TKA, place antibiotic spacer for septic L TKA 08/07/14; s/p L knee irrigation and debridement 04/10/14 and 05/09/14; s/p L TKA 04/10/14.   Medications include: combivent, amlodipine, ceftazidime, glipizide, viagra.   Preoperative labs reviewed.    Chest x-ray 10/07/2014 reviewed. New subsegmental atelectasis in the lingula with some clearing of atelectasis at the right lung base. Mild stable interstitial prominence bilaterally consistent with chronic bronchitis. There is no pulmonary edema.  EKG 09/01/2014: NSR.   Pt reported to PAT RN he has a sinus infection, post-nasal drainage and related cough. Pt to f/u with PCP 10/09/14. Will revisit chart after that appointment.   Willeen Cass, FNP-BC [facility]Anesthesiology Phone: [phone] 10/08/2014 2:59 PM

## 2014-10-09 ENCOUNTER — Encounter: Payer: Self-pay | Admitting: Internal Medicine

## 2014-10-09 ENCOUNTER — Ambulatory Visit (INDEPENDENT_AMBULATORY_CARE_PROVIDER_SITE_OTHER): Payer: Commercial Managed Care - HMO | Admitting: Internal Medicine

## 2014-10-09 VITALS — BP 128/82 | HR 97 | Temp 98.6°F | Resp 18 | Ht 71.0 in | Wt 196.2 lb

## 2014-10-09 DIAGNOSIS — N186 End stage renal disease: Secondary | ICD-10-CM

## 2014-10-09 DIAGNOSIS — Z992 Dependence on renal dialysis: Secondary | ICD-10-CM | POA: Diagnosis not present

## 2014-10-09 DIAGNOSIS — J301 Allergic rhinitis due to pollen: Secondary | ICD-10-CM

## 2014-10-09 DIAGNOSIS — E1121 Type 2 diabetes mellitus with diabetic nephropathy: Secondary | ICD-10-CM | POA: Diagnosis not present

## 2014-10-09 DIAGNOSIS — M009 Pyogenic arthritis, unspecified: Secondary | ICD-10-CM | POA: Diagnosis not present

## 2014-10-09 DIAGNOSIS — E1129 Type 2 diabetes mellitus with other diabetic kidney complication: Secondary | ICD-10-CM

## 2014-10-09 DIAGNOSIS — J0101 Acute recurrent maxillary sinusitis: Secondary | ICD-10-CM

## 2014-10-09 DIAGNOSIS — I10 Essential (primary) hypertension: Secondary | ICD-10-CM

## 2014-10-09 MED ORDER — ALBUTEROL SULFATE HFA 108 (90 BASE) MCG/ACT IN AERS: 2.0000 | INHALATION_SPRAY | Freq: Four times a day (QID) | RESPIRATORY_TRACT | Status: DC | PRN

## 2014-10-09 MED ORDER — FLUTICASONE PROPIONATE 50 MCG/ACT NA SUSP: 2.0000 | Freq: Every day | NASAL | Status: DC

## 2014-10-09 MED ORDER — CEFUROXIME AXETIL 500 MG PO TABS: ORAL_TABLET | ORAL | Status: DC

## 2014-10-09 MED ORDER — MONTELUKAST SODIUM 10 MG PO TABS: 10.0000 mg | ORAL_TABLET | Freq: Every day | ORAL | Status: DC

## 2014-10-09 NOTE — Progress Notes (Addendum)
[name], male   DOB: September 29, 1960, 54 y.o.   MRN: [id number]    Location:    [facility]   Place of Service:  OFFICE   Chief Complaint  Patient presents with  . Medical Management of Chronic Issues    3 month follow-up for Hypertension, DM, Patient c/o thinks he has a sinus infection and problem with his chest a lot of fluid and crackling    HPI:  54 yo male seen today for f/u. He reports URI sx's x 3 days. Tmax last night 101.3 and improved with Tylenol. He c/o CP sinus pressure, bloody dark nasal d/c, post nasal drip, chest congestion. He is using albuterol HFA prn. He was unable to afford combivent ($300). He is taking allegra daily. Uses flonase spray prn. He does not use saline nasal spray. His sx for left knee was postponed due to pleural effusion. He receives HD on regular basis.  Left knee infection - sx postponed due to pleural effusion. Labs by ortho revealed no knee infection but he is still receiving IV Vanco. Completed fortaz  HTN/CHF - intermittent pleural effusion that improve with HD. He takes amlodipine for BP   DM - BS 90-120s at home. No low BS reactions. He takes glipizide. He has chronic numbness in feet and takes gabapentin  ESRD/HD MWF - he is compliant with tx. Takes phoslo and Vit D supplement  PMR/chronic pain - pain controlled with prn ibuprofen  Anxiety/insomnia/depression - sleep stable on prn zolpidem  Hx CVA - stable  Past Medical History  Diagnosis Date  . Polymyalgia rheumatica   . Hypertension   . Diabetic neuropathy   . Osteomyelitis of foot, left, acute   . Anxiety   . Insomnia, unspecified   . Unspecified vitamin D deficiency   . Anemia, unspecified   . Other chronic postoperative pain   . Allergy   . Unspecified osteomyelitis, site unspecified   . Long term (current) use of anticoagulants   . 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 Ulcer     diabetic foot   . Arthralgia 2010    polyarticular  . Hemorrhoids,  internal 10/2011    small  . CHF (congestive heart failure)   . CHF (congestive heart failure) 07/25/2009    denies  . Hemodialysis access site with mature fistula   . Myocardial infarction 1995  . Pneumonia     "probably 4-5 times" (05/09/2014)  . Type II diabetes mellitus dx'd 1995  . History of blood transfusion     "related to the anemia"  . GERD (gastroesophageal reflux disease)     hx "before I lost weight"  . Arthritis     "back, knees" (05/09/2014)  . Chronic lower back pain   . Stroke 01/10/06    denies residual on 05/09/2014  . Sleep apnea     "lost weight; no more problem" (05/09/2014)  . Coughing     pt. reports that he has drainage from sinus infection  . ESRD (end stage renal disease) on dialysis     started 12/2012; "MWF; [facility] "   . Renal insufficiency   . Unspecified hereditary and idiopathic peripheral neuropathy     feet    Past Surgical History  Procedure Laterality Date  . Amputation  01/21/2012    Procedure: AMPUTATION RAY;  Surgeon: [name], MD;  Location: [facility];  Service: Orthopedics;  Laterality: Left;  Left Foot 4th Ray Amputation  .  Anterior cervical decomp/discectomy fusion  02/2011  . Knee arthroscopy Left 08-25-2012  . Toe amputation Bilateral     "I've lost 7 toes over the last 7 years" (05/09/2014)  . Refractive surgery Bilateral   . Bascilic vein transposition Left 10/19/2012    Procedure: BASCILIC VEIN TRANSPOSITION;  Surgeon: Serafina Mitchell, MD;  Location: [facility];  Service: Vascular;  Laterality: Left;  . Tonsillectomy    . Amputation Left 05/04/2013    Procedure: AMPUTATION DIGIT;  Surgeon: [name], MD;  Location: [facility];  Service: Orthopedics;  Laterality: Left;  Left Great Toe Amputation at MTP  . Toe surgery Left April 2015    Big toe removed on left foot.  . Total knee arthroplasty Left 04/10/2014    Procedure: TOTAL KNEE ARTHROPLASTY;  Surgeon: [name], MD;  Location: [facility];  Service: Orthopedics;  Laterality:  Left;  . Wound debridement Left 05/09/2014    Dehiscence Left Total Knee Arthroplasty Incision  . Back surgery    . Uvulopalatopharyngoplasty, tonsillectomy and septoplasty  ~ 1989  . I&d extremity Left 05/09/2014    Procedure: Irrigation and Debridement Left Knee and Closure of Total Knee Arthroplasty Incision;  Surgeon: [name], MD;  Location: [facility];  Service: Orthopedics;  Laterality: Left;  . I&d knee with poly exchange Left 05/31/2014    Procedure: IRRIGATION AND DEBRIDEMENT LEFT KNEE, PLACE ANTIBIOTIC BEADS,  POLY EXCHANGE;  Surgeon: [name], MD;  Location: [facility];  Service: Orthopedics;  Laterality: Left;  . Excisional total knee arthroplasty with antibiotic spacers Left 08/07/2014    Procedure: Replace Left Total Knee Arthroplasty,  Place Antibiotic Spacer;  Surgeon: [name], MD;  Location: [facility];  Service: Orthopedics;  Laterality: Left;    Patient Care Team: Gildardo Cranker, DO as PCP - General (Internal Medicine)  Social History   Social History  . Marital Status: Married    Spouse Name: N/A  . Number of Children: N/A  . Years of Education: N/A   Occupational History  . Not on file.   Social History Main Topics  . Smoking status: Former Smoker -- 0.12 packs/day for 32 years    Types: Cigarettes  . Smokeless tobacco: Former Systems developer    Quit date: 10/06/2014     Comment: 3 -4 cig daily   . Alcohol Use: No  . Drug Use: No  . Sexual Activity: Yes   Other Topics Concern  . Not on file   Social History Narrative     reports that he has quit smoking. His smoking use included Cigarettes. He has a 3.84 pack-year smoking history. He quit smokeless tobacco use 3 days ago. He reports that he does not drink alcohol or use illicit drugs.  Allergies  Allergen Reactions  . Morphine And Related Other (See Comments)    hallucinations  . Tygacil [Tigecycline] Other (See Comments)    Makes him feel crazy    Medications: Patient's Medications  New Prescriptions    No medications on file  Previous Medications   ALBUTEROL-IPRATROPIUM (COMBIVENT) 18-103 MCG/ACT INHALER    2 puffs TID x 7 days then BID x 7 days then daily x 7 days and stop   AMLODIPINE (NORVASC) 5 MG TABLET    Take 5 mg by mouth daily.    CALCIUM ACETATE (PHOSLO) 667 MG CAPSULE    Take 667 mg by mouth 3 (three) times daily with meals.   CEFTAZIDIME 2 G IN DEXTROSE 5 % 50 ML  Inject 2 g into the vein every Monday, Wednesday, and Friday with hemodialysis.   CHOLECALCIFEROL (VITAMIN D PO)    Take 1 tablet by mouth every Monday, Wednesday, and Friday. Take at dialysis   FEXOFENADINE (ALLEGRA) 180 MG TABLET    Take 1 tablet (180 mg total) by mouth daily.   GABAPENTIN (NEURONTIN) 100 MG CAPSULE    Take 400 mg by mouth at bedtime.    GLIPIZIDE (GLUCOTROL) 5 MG TABLET    Take 1 tablet (5 mg total) by mouth daily before breakfast.   IBUPROFEN (ADVIL,MOTRIN) 200 MG TABLET    Take 200 mg by mouth daily as needed (pain).   SILDENAFIL (VIAGRA) 25 MG TABLET    Take 2 tablets (50 mg total) by mouth daily as needed for erectile dysfunction.   VANCOMYCIN (VANCOCIN) 1 GM/200ML SOLN    Inject 200 mLs (1,000 mg total) into the vein every Monday, Wednesday, and Friday with hemodialysis.   ZOLPIDEM (AMBIEN) 10 MG TABLET    Take 1 tablet (10 mg total) by mouth at bedtime as needed for sleep.  Modified Medications   No medications on file  Discontinued Medications   No medications on file    Review of Systems  Constitutional: Positive for fever and fatigue. Negative for chills and activity change.  HENT: Positive for nosebleeds, postnasal drip, sinus pressure and sore throat. Negative for trouble swallowing.   Eyes: Negative for visual disturbance.  Respiratory: Positive for cough (nonproductive). Negative for chest tightness, shortness of breath and wheezing.   Cardiovascular: Positive for chest pain. Negative for palpitations and leg swelling.  Gastrointestinal: Negative for nausea, vomiting, abdominal pain  and blood in stool.  Genitourinary: Negative for urgency, frequency and difficulty urinating.  Musculoskeletal: Negative for arthralgias and gait problem.  Skin: Negative for rash.  Neurological: Positive for dizziness and weakness. Negative for headaches.  Psychiatric/Behavioral: Negative for confusion and sleep disturbance. The patient is not nervous/anxious.     Filed Vitals:   10/09/14 0823  BP: 128/82  Pulse: 97  Temp: 98.6 F (37 C)  TempSrc: Oral  Resp: 18  Height: '5\' 11"'  (1.803 m)  Weight: 196 lb 3.2 oz (88.996 kg)  SpO2: 93%   Body mass index is 27.38 kg/(m^2).  Physical Exam  Constitutional: He is oriented to person, place, and time. He appears well-developed and well-nourished. No distress.  Ill appearing in NAD  HENT:  Mouth/Throat: Oropharynx is clear and moist.  L>R TM dull with air fluid level but no redness or bulging. R>L maxillary sinus TTP with boggy tissue texture changes. Nares with pale enlarged grey dry turbinates. Oropharynx with cobblestoning appearance and min redness but no exudate  Eyes: Pupils are equal, round, and reactive to light. No scleral icterus.  Neck: Neck supple. Carotid bruit is not present. No thyromegaly present.  Cardiovascular: Normal rate, regular rhythm and intact distal pulses.  Exam reveals no gallop and no friction rub.   Murmur (1/6 SEM) heard. no distal LE swelling. No calf TTP. Left arm AVF with palpable thrill and audible bruit  Pulmonary/Chest: Effort normal. No stridor. He has wheezes (inspiratory intermittent wheeze R>L base). He has rales (left wet rales >R). He exhibits no tenderness.  Abdominal: Soft. Bowel sounds are normal. He exhibits no distension, no abdominal bruit, no pulsatile midline mass and no mass. There is no tenderness. There is no rebound and no guarding.  Musculoskeletal: He exhibits edema (left knee with increased warmth to touch and reduced ROM) and tenderness.  Gait  antalgic  Lymphadenopathy:    He has  no cervical adenopathy.  Neurological: He is alert and oriented to person, place, and time.  Skin: Skin is warm and dry. No rash noted.  Psychiatric: He has a normal mood and affect. His behavior is normal. Judgment and thought content normal.   Diabetic Foot Exam - Simple   Simple Foot Form  Diabetic Foot exam was performed with the following findings:  Yes 10/09/2014 10:46 AM  Visual Inspection  See comments:  Yes  Sensation Testing  See comments:  Yes  Pulse Check  Posterior Tibialis and Dorsalis pulse intact bilaterally:  Yes  Comments  Reduced monofilament testing b/l. Multiple toes missing due to surgical amputation b/l. Left plantar mobile soft tissue lump, NT       Labs reviewed: Hospital Outpatient Visit on 10/07/2014  Component Date Value Ref Range Status  . MRSA, PCR 10/07/2014 NEGATIVE  NEGATIVE Final  . Staphylococcus aureus 10/07/2014 NEGATIVE  NEGATIVE Final   Comment:        The Xpert SA Assay (FDA approved for NASAL specimens in patients over 57 years of age), is one component of a comprehensive surveillance program.  Test performance has been validated by [facility] for patients greater than or equal to 37 year old. It is not intended to diagnose infection nor to guide or monitor treatment.   Marland Kitchen aPTT 10/07/2014 28  24 - 37 seconds Final  . WBC 10/07/2014 12.4* 4.0 - 10.5 K/uL Final  . RBC 10/07/2014 4.39  4.22 - 5.81 MIL/uL Final  . Hemoglobin 10/07/2014 11.8* 13.0 - 17.0 g/dL Final  . HCT 10/07/2014 38.0* 39.0 - 52.0 % Final  . MCV 10/07/2014 86.6  78.0 - 100.0 fL Final  . MCH 10/07/2014 26.9  26.0 - 34.0 pg Final  . MCHC 10/07/2014 31.1  30.0 - 36.0 g/dL Final  . RDW 10/07/2014 18.8* 11.5 - 15.5 % Final  . Platelets 10/07/2014 252  150 - 400 K/uL Final  . Sodium 10/07/2014 137  135 - 145 mmol/L Final  . Potassium 10/07/2014 5.4* 3.5 - 5.1 mmol/L Final  . Chloride 10/07/2014 99* 101 - 111 mmol/L Final  . CO2 10/07/2014 25  22 - 32 mmol/L Final  .  Glucose, Bld 10/07/2014 93  65 - 99 mg/dL Final  . BUN 10/07/2014 36* 6 - 20 mg/dL Final  . Creatinine, Ser 10/07/2014 7.25* 0.61 - 1.24 mg/dL Final  . Calcium 10/07/2014 9.2  8.9 - 10.3 mg/dL Final  . Total Protein 10/07/2014 7.9  6.5 - 8.1 g/dL Final  . Albumin 10/07/2014 2.9* 3.5 - 5.0 g/dL Final  . AST 10/07/2014 16  15 - 41 U/L Final  . ALT 10/07/2014 9* 17 - 63 U/L Final  . Alkaline Phosphatase 10/07/2014 89  38 - 126 U/L Final  . Total Bilirubin 10/07/2014 0.5  0.3 - 1.2 mg/dL Final  . GFR calc non Af Amer 10/07/2014 8* >60 mL/min Final  . GFR calc Af Amer 10/07/2014 9* >60 mL/min Final   Comment: (NOTE) The eGFR has been calculated using the CKD EPI equation. This calculation has not been validated in all clinical situations. eGFR's persistently <60 mL/min signify possible Chronic Kidney Disease.   . Anion gap 10/07/2014 13  5 - 15 Final  . Prothrombin Time 10/07/2014 14.9  11.6 - 15.2 seconds Final  . INR 10/07/2014 1.15  0.00 - 1.49 Final  Admission on 09/01/2014, Discharged on 09/01/2014  Component Date Value Ref Range Status  .  Sodium 09/01/2014 137  135 - 145 mmol/L Final  . Potassium 09/01/2014 3.8  3.5 - 5.1 mmol/L Final  . Chloride 09/01/2014 99* 101 - 111 mmol/L Final  . CO2 09/01/2014 27  22 - 32 mmol/L Final  . Glucose, Bld 09/01/2014 118* 65 - 99 mg/dL Final  . BUN 09/01/2014 23* 6 - 20 mg/dL Final  . Creatinine, Ser 09/01/2014 4.50* 0.61 - 1.24 mg/dL Final  . Calcium 09/01/2014 9.2  8.9 - 10.3 mg/dL Final  . GFR calc non Af Amer 09/01/2014 13* >60 mL/min Final  . GFR calc Af Amer 09/01/2014 16* >60 mL/min Final   Comment: (NOTE) The eGFR has been calculated using the CKD EPI equation. This calculation has not been validated in all clinical situations. eGFR's persistently <60 mL/min signify possible Chronic Kidney Disease.   . Anion gap 09/01/2014 11  5 - 15 Final  . WBC 09/01/2014 6.4  4.0 - 10.5 K/uL Final  . RBC 09/01/2014 3.84* 4.22 - 5.81 MIL/uL Final   . Hemoglobin 09/01/2014 9.9* 13.0 - 17.0 g/dL Final  . HCT 09/01/2014 32.2* 39.0 - 52.0 % Final  . MCV 09/01/2014 83.9  78.0 - 100.0 fL Final  . MCH 09/01/2014 25.8* 26.0 - 34.0 pg Final  . MCHC 09/01/2014 30.7  30.0 - 36.0 g/dL Final  . RDW 09/01/2014 17.6* 11.5 - 15.5 % Final  . Platelets 09/01/2014 281  150 - 400 K/uL Final  . Troponin i, poc 09/01/2014 0.01  0.00 - 0.08 ng/mL Final  . Comment 3 09/01/2014          Final   Comment: Due to the release kinetics of cTnI, a negative result within the first hours of the onset of symptoms does not rule out myocardial infarction with certainty. If myocardial infarction is still suspected, repeat the test at appropriate intervals.   . B Natriuretic Peptide 09/01/2014 454.5* 0.0 - 100.0 pg/mL Final  Admission on 08/07/2014, Discharged on 08/12/2014  No results displayed because visit has over 200 results.    Admission on 07/13/2014, Discharged on 07/14/2014  Component Date Value Ref Range Status  . WBC 07/13/2014 13.5* 4.0 - 10.5 K/uL Final  . RBC 07/13/2014 4.01* 4.22 - 5.81 MIL/uL Final  . Hemoglobin 07/13/2014 10.5* 13.0 - 17.0 g/dL Final  . HCT 07/13/2014 34.5* 39.0 - 52.0 % Final  . MCV 07/13/2014 86.0  78.0 - 100.0 fL Final  . MCH 07/13/2014 26.2  26.0 - 34.0 pg Final  . MCHC 07/13/2014 30.4  30.0 - 36.0 g/dL Final  . RDW 07/13/2014 17.4* 11.5 - 15.5 % Final  . Platelets 07/13/2014 280  150 - 400 K/uL Final  . Neutrophils Relative % 07/13/2014 73  43 - 77 % Final  . Neutro Abs 07/13/2014 9.8* 1.7 - 7.7 K/uL Final  . Lymphocytes Relative 07/13/2014 19  12 - 46 % Final  . Lymphs Abs 07/13/2014 2.6  0.7 - 4.0 K/uL Final  . Monocytes Relative 07/13/2014 7  3 - 12 % Final  . Monocytes Absolute 07/13/2014 0.9  0.1 - 1.0 K/uL Final  . Eosinophils Relative 07/13/2014 1  0 - 5 % Final  . Eosinophils Absolute 07/13/2014 0.1  0.0 - 0.7 K/uL Final  . Basophils Relative 07/13/2014 0  0 - 1 % Final  . Basophils Absolute 07/13/2014 0.0   0.0 - 0.1 K/uL Final  . Sodium 07/13/2014 137  135 - 145 mmol/L Final  . Potassium 07/13/2014 5.5* 3.5 - 5.1 mmol/L Final  .  Chloride 07/13/2014 104  101 - 111 mmol/L Final  . CO2 07/13/2014 20* 22 - 32 mmol/L Final  . Glucose, Bld 07/13/2014 253* 65 - 99 mg/dL Final  . BUN 07/13/2014 39* 6 - 20 mg/dL Final  . Creatinine, Ser 07/13/2014 6.96* 0.61 - 1.24 mg/dL Final  . Calcium 07/13/2014 8.7* 8.9 - 10.3 mg/dL Final  . Total Protein 07/13/2014 8.5* 6.5 - 8.1 g/dL Final  . Albumin 07/13/2014 2.8* 3.5 - 5.0 g/dL Final  . AST 07/13/2014 19  15 - 41 U/L Final  . ALT 07/13/2014 12* 17 - 63 U/L Final  . Alkaline Phosphatase 07/13/2014 89  38 - 126 U/L Final  . Total Bilirubin 07/13/2014 0.6  0.3 - 1.2 mg/dL Final  . GFR calc non Af Amer 07/13/2014 8* >60 mL/min Final  . GFR calc Af Amer 07/13/2014 9* >60 mL/min Final   Comment: (NOTE) The eGFR has been calculated using the CKD EPI equation. This calculation has not been validated in all clinical situations. eGFR's persistently <60 mL/min signify possible Chronic Kidney Disease.   . Anion gap 07/13/2014 13  5 - 15 Final  . Lactic Acid, Venous 07/13/2014 2.26* 0.5 - 2.0 mmol/L Final  . Comment 07/13/2014 NOTIFIED PHYSICIAN   Final  . Troponin i, poc 07/13/2014 0.02  0.00 - 0.08 ng/mL Final  . Comment 3 07/13/2014          Final   Comment: Due to the release kinetics of cTnI, a negative result within the first hours of the onset of symptoms does not rule out myocardial infarction with certainty. If myocardial infarction is still suspected, repeat the test at appropriate intervals.   . Glucose-Capillary 07/13/2014 227* 65 - 99 mg/dL Final  . Sodium 07/13/2014 138  135 - 145 mmol/L Final  . Potassium 07/13/2014 5.4* 3.5 - 5.1 mmol/L Final  . Chloride 07/13/2014 107  101 - 111 mmol/L Final  . BUN 07/13/2014 40* 6 - 20 mg/dL Final  . Creatinine, Ser 07/13/2014 7.10* 0.61 - 1.24 mg/dL Final  . Glucose, Bld 07/13/2014 252* 65 - 99 mg/dL  Final  . Calcium, Ion 07/13/2014 1.11* 1.12 - 1.23 mmol/L Final  . TCO2 07/13/2014 20  0 - 100 mmol/L Final  . Hemoglobin 07/13/2014 11.2* 13.0 - 17.0 g/dL Final  . HCT 07/13/2014 33.0* 39.0 - 52.0 % Final  . pH, Arterial 07/13/2014 7.376  7.350 - 7.450 Final  . pCO2 arterial 07/13/2014 33.5* 35.0 - 45.0 mmHg Final  . pO2, Arterial 07/13/2014 72.0* 80.0 - 100.0 mmHg Final  . Bicarbonate 07/13/2014 19.6* 20.0 - 24.0 mEq/L Final  . TCO2 07/13/2014 21  0 - 100 mmol/L Final  . O2 Saturation 07/13/2014 94.0   Final  . Acid-base deficit 07/13/2014 5.0* 0.0 - 2.0 mmol/L Final  . Patient temperature 07/13/2014 98.6 F   Final  . Collection site 07/13/2014 BRACHIAL ARTERY   Final  . Drawn by 07/13/2014 RT   Final  . Sample type 07/13/2014 ARTERIAL   Final  . Specimen Description 07/13/2014 BLOOD RIGHT ARM   Final  . Special Requests 07/13/2014 BOTTLES DRAWN AEROBIC AND ANAEROBIC 5CC EA   Final  . Culture 07/13/2014 NO GROWTH 5 DAYS   Final  . Report Status 07/13/2014 07/18/2014 FINAL   Final  . Specimen Description 07/13/2014 BLOOD RIGHT HAND   Final  . Special Requests 07/13/2014 BOTTLES DRAWN AEROBIC ONLY 10CC   Final  . Culture 07/13/2014 NO GROWTH 5 DAYS   Final  .  Report Status 07/13/2014 07/19/2014 FINAL   Final  . Lactic Acid, Venous 07/13/2014 1.43  0.5 - 2.0 mmol/L Final  . Hgb A1c MFr Bld 07/14/2014 5.5  4.8 - 5.6 % Final   Comment: (NOTE)         Pre-diabetes: 5.7 - 6.4         Diabetes: >6.4         Glycemic control for adults with diabetes: <7.0   . Mean Plasma Glucose 07/14/2014 111   Final   Comment: (NOTE) Performed At: [facility] [address], [place] [id number] Lindon Romp MD [id number]   . Sodium 07/14/2014 137  135 - 145 mmol/L Final  . Potassium 07/14/2014 4.2  3.5 - 5.1 mmol/L Final  . Chloride 07/14/2014 101  101 - 111 mmol/L Final  . CO2 07/14/2014 22  22 - 32 mmol/L Final  . Glucose, Bld 07/14/2014 205* 65 - 99 mg/dL Final  . BUN  07/14/2014 24* 6 - 20 mg/dL Final  . Creatinine, Ser 07/14/2014 4.32* 0.61 - 1.24 mg/dL Final   DELTA CHECK NOTED  . Calcium 07/14/2014 8.5* 8.9 - 10.3 mg/dL Final  . GFR calc non Af Amer 07/14/2014 14* >60 mL/min Final  . GFR calc Af Amer 07/14/2014 16* >60 mL/min Final   Comment: (NOTE) The eGFR has been calculated using the CKD EPI equation. This calculation has not been validated in all clinical situations. eGFR's persistently <60 mL/min signify possible Chronic Kidney Disease.   . Anion gap 07/14/2014 14  5 - 15 Final  . WBC 07/14/2014 8.9  4.0 - 10.5 K/uL Final  . RBC 07/14/2014 3.41* 4.22 - 5.81 MIL/uL Final  . Hemoglobin 07/14/2014 9.0* 13.0 - 17.0 g/dL Final  . HCT 07/14/2014 28.9* 39.0 - 52.0 % Final  . MCV 07/14/2014 84.8  78.0 - 100.0 fL Final  . MCH 07/14/2014 26.4  26.0 - 34.0 pg Final  . MCHC 07/14/2014 31.1  30.0 - 36.0 g/dL Final  . RDW 07/14/2014 17.4* 11.5 - 15.5 % Final  . Platelets 07/14/2014 242  150 - 400 K/uL Final  . MRSA by PCR 07/14/2014 NEGATIVE  NEGATIVE Final   Comment:        The GeneXpert MRSA Assay (FDA approved for NASAL specimens only), is one component of a comprehensive MRSA colonization surveillance program. It is not intended to diagnose MRSA infection nor to guide or monitor treatment for MRSA infections.   . Glucose-Capillary 07/14/2014 187* 65 - 99 mg/dL Final  . Glucose-Capillary 07/14/2014 167* 65 - 99 mg/dL Final    Dg Chest 2 View  10/07/2014   CLINICAL DATA:  Preoperative examination prior to left total knee surgery  EXAM: CHEST  2 VIEW  COMPARISON:  PA and lateral chest of September 01, 2014 and CT scan of the same day  FINDINGS: The lungs are adequately inflated. The interstitial markings remain increased bilaterally. There is new subsegmental atelectasis in the lingula. Right basilar atelectasis has cleared. There is no significant pleural effusion. The cardiac silhouette is top-normal in size but stable. The central pulmonary  vascularity is prominent. There is mild multilevel degenerative disc space narrowing of the thoracic spine.  IMPRESSION: New subsegmental atelectasis in the lingula with some clearing of atelectasis at the right lung base. Mild stable interstitial prominence bilaterally consistent with chronic bronchitis. There is no pulmonary edema.   Electronically Signed   By: David  Martinique M.D.   On: 10/07/2014 13:38     Assessment/Plan  ICD-9-CM ICD-10-CM   1. Acute recurrent maxillary sinusitis 461.0 J01.01 cefUROXime (CEFTIN) 500 MG tablet  2. Allergic rhinitis due to pollen - uncontrolled 477.0 J30.1 montelukast (SINGULAIR) 10 MG tablet     fluticasone (FLONASE) 50 MCG/ACT nasal spray  3. ESRD on dialysis MWF via left arm AVF 585.6 N18.6    V45.11 Z99.2   4. Essential hypertension, benign -stable 401.1 I10   5. Diabetes mellitus with renal manifestations, controlled - stable. Last A1c 5.1% 250.40 E11.21   6. Septic joint of left knee joint - stable; remains on IV vanco 711.06 M00.9     --ceftin 536m after each HD x 10 doses  --use saline nasal spray as needed to keep nose moist  --proper instructions given on use of flonase  --start singulair qhs  --cont prn HFA.  --cont other meds as ordered  --cont HD as scheduled  --f/u with Ortho as scheduled  --labs per nephro  --f/u in 3 mos for routine visit  Monica S. CPerlie Gold PLexington Regional [facility] Centerand Adult Medicine [address] [phone] (Monday-Friday 8 AM - 5 PM) ([phone] 5 PM and follow prompts

## 2014-10-09 NOTE — Patient Instructions (Signed)
Use OTC saline nasal spray as needed to keep nose moist  Continue other medications as ordered  Take ceftin after each dialysis ONLY x 10 doses  Keep appt with specialists as scheduled  Follow up in 3 mos for routine visit

## 2014-10-24 ENCOUNTER — Encounter (HOSPITAL_COMMUNITY): Payer: Self-pay | Admitting: *Deleted

## 2014-10-24 MED ORDER — CEFAZOLIN SODIUM-DEXTROSE 2-3 GM-% IV SOLR
2.0000 g | INTRAVENOUS | Status: AC
  Administered 2014-10-25: 2 g via INTRAVENOUS
  Filled 2014-10-24: qty 50

## 2014-10-24 MED ORDER — CHLORHEXIDINE GLUCONATE 4 % EX LIQD: 60.0000 mL | Freq: Once | CUTANEOUS | Status: DC

## 2014-10-24 NOTE — Progress Notes (Signed)
Pt states he was treated for a sinus infection and he is feeling much better now.

## 2014-10-25 ENCOUNTER — Inpatient Hospital Stay (HOSPITAL_COMMUNITY)
Admission: RE | Admit: 2014-10-25 | Discharge: 2014-10-29 | DRG: 466 | Disposition: A | Discharge status: Home Health | Payer: Commercial Managed Care - HMO | Source: Ambulatory Visit | Attending: Orthopedic Surgery | Admitting: Orthopedic Surgery

## 2014-10-25 ENCOUNTER — Inpatient Hospital Stay (HOSPITAL_COMMUNITY): Payer: Commercial Managed Care - HMO | Admitting: Anesthesiology

## 2014-10-25 ENCOUNTER — Encounter (HOSPITAL_COMMUNITY): Payer: Self-pay | Admitting: Surgery

## 2014-10-25 ENCOUNTER — Encounter (HOSPITAL_COMMUNITY)
Admission: RE | Disposition: A | Discharge status: Home Health | Payer: Self-pay | Source: Ambulatory Visit | Attending: Orthopedic Surgery

## 2014-10-25 ENCOUNTER — Inpatient Hospital Stay (HOSPITAL_COMMUNITY): Payer: Commercial Managed Care - HMO | Admitting: Vascular Surgery

## 2014-10-25 DIAGNOSIS — Z87891 Personal history of nicotine dependence: Secondary | ICD-10-CM

## 2014-10-25 DIAGNOSIS — Z992 Dependence on renal dialysis: Secondary | ICD-10-CM | POA: Diagnosis not present

## 2014-10-25 DIAGNOSIS — M353 Polymyalgia rheumatica: Secondary | ICD-10-CM | POA: Diagnosis present

## 2014-10-25 DIAGNOSIS — Y831 Surgical operation with implant of artificial internal device as the cause of abnormal reaction of the patient, or of later complication, without mention of misadventure at the time of the procedure: Secondary | ICD-10-CM | POA: Diagnosis present

## 2014-10-25 DIAGNOSIS — E785 Hyperlipidemia, unspecified: Secondary | ICD-10-CM | POA: Diagnosis present

## 2014-10-25 DIAGNOSIS — E1122 Type 2 diabetes mellitus with diabetic chronic kidney disease: Secondary | ICD-10-CM | POA: Diagnosis present

## 2014-10-25 DIAGNOSIS — D631 Anemia in chronic kidney disease: Secondary | ICD-10-CM | POA: Diagnosis present

## 2014-10-25 DIAGNOSIS — T84018D Broken internal joint prosthesis, other site, subsequent encounter: Secondary | ICD-10-CM

## 2014-10-25 DIAGNOSIS — N2581 Secondary hyperparathyroidism of renal origin: Secondary | ICD-10-CM | POA: Diagnosis present

## 2014-10-25 DIAGNOSIS — E1143 Type 2 diabetes mellitus with diabetic autonomic (poly)neuropathy: Secondary | ICD-10-CM | POA: Diagnosis present

## 2014-10-25 DIAGNOSIS — T8454XA Infection and inflammatory reaction due to internal left knee prosthesis, initial encounter: Principal | ICD-10-CM | POA: Diagnosis present

## 2014-10-25 DIAGNOSIS — Z8673 Personal history of transient ischemic attack (TIA), and cerebral infarction without residual deficits: Secondary | ICD-10-CM

## 2014-10-25 DIAGNOSIS — N186 End stage renal disease: Secondary | ICD-10-CM | POA: Diagnosis present

## 2014-10-25 DIAGNOSIS — M25562 Pain in left knee: Secondary | ICD-10-CM | POA: Diagnosis present

## 2014-10-25 DIAGNOSIS — E1159 Type 2 diabetes mellitus with other circulatory complications: Secondary | ICD-10-CM | POA: Diagnosis present

## 2014-10-25 DIAGNOSIS — Z96659 Presence of unspecified artificial knee joint: Secondary | ICD-10-CM

## 2014-10-25 DIAGNOSIS — T84018S Broken internal joint prosthesis, other site, sequela: Secondary | ICD-10-CM

## 2014-10-25 DIAGNOSIS — E559 Vitamin D deficiency, unspecified: Secondary | ICD-10-CM | POA: Diagnosis present

## 2014-10-25 DIAGNOSIS — I120 Hypertensive chronic kidney disease with stage 5 chronic kidney disease or end stage renal disease: Secondary | ICD-10-CM | POA: Diagnosis present

## 2014-10-25 HISTORY — PX: TOTAL KNEE REVISION: SHX996

## 2014-10-25 LAB — TYPE AND SCREEN
ABO/RH(D): O NEG
Antibody Screen: NEGATIVE

## 2014-10-25 LAB — POCT I-STAT 4, (NA,K, GLUC, HGB,HCT)
GLUCOSE: 145 mg/dL — AB (ref 65–99)
HEMATOCRIT: 37 % — AB (ref 39.0–52.0)
HEMOGLOBIN: 12.6 g/dL — AB (ref 13.0–17.0)
Potassium: 4.5 mmol/L (ref 3.5–5.1)
Sodium: 137 mmol/L (ref 135–145)

## 2014-10-25 LAB — GLUCOSE, CAPILLARY
Glucose-Capillary: 141 mg/dL — ABNORMAL HIGH (ref 65–99)
Glucose-Capillary: 129 mg/dL — ABNORMAL HIGH (ref 65–99)
Glucose-Capillary: 160 mg/dL — ABNORMAL HIGH (ref 65–99)

## 2014-10-25 SURGERY — TOTAL KNEE REVISION
Anesthesia: General | Site: Knee | Laterality: Left

## 2014-10-25 MED ORDER — FENTANYL CITRATE (PF) 100 MCG/2ML IJ SOLN
INTRAMUSCULAR | Status: AC
  Administered 2014-10-25: 50 ug
  Filled 2014-10-25: qty 2

## 2014-10-25 MED ORDER — MIDAZOLAM HCL 5 MG/5ML IJ SOLN
INTRAMUSCULAR | Status: DC | PRN
  Administered 2014-10-25: 2 mg via INTRAVENOUS

## 2014-10-25 MED ORDER — ZOLPIDEM TARTRATE 5 MG PO TABS
10.0000 mg | ORAL_TABLET | Freq: Every evening | ORAL | Status: DC | PRN
  Administered 2014-10-28: 10 mg via ORAL
  Filled 2014-10-25: qty 2

## 2014-10-25 MED ORDER — FENTANYL CITRATE (PF) 100 MCG/2ML IJ SOLN
INTRAMUSCULAR | Status: DC | PRN
  Administered 2014-10-25 (×5): 50 ug via INTRAVENOUS

## 2014-10-25 MED ORDER — VITAMIN D 1000 UNITS PO TABS
1000.0000 [IU] | ORAL_TABLET | ORAL | Status: DC
  Administered 2014-10-26 – 2014-10-28 (×2): 1000 [IU] via ORAL
  Filled 2014-10-25 (×3): qty 1

## 2014-10-25 MED ORDER — PHENYLEPHRINE HCL 10 MG/ML IJ SOLN
10.0000 mg | INTRAVENOUS | Status: DC | PRN
  Administered 2014-10-25: 40 ug/min via INTRAVENOUS

## 2014-10-25 MED ORDER — ONDANSETRON HCL 4 MG/2ML IJ SOLN: 4.0000 mg | Freq: Four times a day (QID) | INTRAMUSCULAR | Status: DC | PRN

## 2014-10-25 MED ORDER — ONDANSETRON HCL 4 MG/2ML IJ SOLN
INTRAMUSCULAR | Status: DC | PRN
  Administered 2014-10-25: 4 mg via INTRAVENOUS

## 2014-10-25 MED ORDER — TRANEXAMIC ACID 1000 MG/10ML IV SOLN
2000.0000 mg | INTRAVENOUS | Status: AC
  Administered 2014-10-25: 2000 mg via TOPICAL
  Filled 2014-10-25: qty 20

## 2014-10-25 MED ORDER — FENTANYL CITRATE (PF) 250 MCG/5ML IJ SOLN
INTRAMUSCULAR | Status: AC
  Filled 2014-10-25: qty 5

## 2014-10-25 MED ORDER — MIDAZOLAM HCL 2 MG/2ML IJ SOLN
INTRAMUSCULAR | Status: AC
  Filled 2014-10-25: qty 4

## 2014-10-25 MED ORDER — MAGNESIUM CITRATE PO SOLN: 1.0000 | Freq: Once | ORAL | Status: DC | PRN

## 2014-10-25 MED ORDER — PHENYLEPHRINE HCL 10 MG/ML IJ SOLN
INTRAMUSCULAR | Status: DC | PRN
  Administered 2014-10-25 (×6): 80 ug via INTRAVENOUS

## 2014-10-25 MED ORDER — HYDROMORPHONE HCL 1 MG/ML IJ SOLN
0.2500 mg | INTRAMUSCULAR | Status: DC | PRN
  Administered 2014-10-25 (×4): 0.5 mg via INTRAVENOUS

## 2014-10-25 MED ORDER — HYDROMORPHONE HCL 1 MG/ML IJ SOLN
INTRAMUSCULAR | Status: AC
  Filled 2014-10-25: qty 1

## 2014-10-25 MED ORDER — 0.9 % SODIUM CHLORIDE (POUR BTL) OPTIME
TOPICAL | Status: DC | PRN
  Administered 2014-10-25: 1000 mL

## 2014-10-25 MED ORDER — SODIUM CHLORIDE 0.9 % IR SOLN
Status: DC | PRN
  Administered 2014-10-25: 4000 mL

## 2014-10-25 MED ORDER — GLIPIZIDE 5 MG PO TABS
5.0000 mg | ORAL_TABLET | Freq: Every day | ORAL | Status: DC
  Administered 2014-10-26 – 2014-10-29 (×3): 5 mg via ORAL
  Filled 2014-10-25 (×3): qty 1

## 2014-10-25 MED ORDER — PHENOL 1.4 % MT LIQD: 1.0000 | OROMUCOSAL | Status: DC | PRN

## 2014-10-25 MED ORDER — CEFAZOLIN SODIUM 1-5 GM-% IV SOLN
1.0000 g | Freq: Four times a day (QID) | INTRAVENOUS | Status: AC
  Administered 2014-10-25 – 2014-10-26 (×2): 1 g via INTRAVENOUS
  Filled 2014-10-25 (×2): qty 50

## 2014-10-25 MED ORDER — BUPIVACAINE-EPINEPHRINE (PF) 0.5% -1:200000 IJ SOLN
INTRAMUSCULAR | Status: DC | PRN
  Administered 2014-10-25: 20 mL via PERINEURAL

## 2014-10-25 MED ORDER — SUCCINYLCHOLINE CHLORIDE 20 MG/ML IJ SOLN
INTRAMUSCULAR | Status: DC | PRN
  Administered 2014-10-25: 120 mg via INTRAVENOUS

## 2014-10-25 MED ORDER — MEPERIDINE HCL 25 MG/ML IJ SOLN: 6.2500 mg | INTRAMUSCULAR | Status: DC | PRN

## 2014-10-25 MED ORDER — ASPIRIN EC 325 MG PO TBEC
325.0000 mg | DELAYED_RELEASE_TABLET | Freq: Every day | ORAL | Status: DC
  Administered 2014-10-26 – 2014-10-29 (×3): 325 mg via ORAL
  Filled 2014-10-25 (×3): qty 1

## 2014-10-25 MED ORDER — BISACODYL 5 MG PO TBEC
5.0000 mg | DELAYED_RELEASE_TABLET | Freq: Every day | ORAL | Status: DC | PRN
  Administered 2014-10-29: 5 mg via ORAL
  Filled 2014-10-25: qty 1

## 2014-10-25 MED ORDER — INSULIN ASPART 100 UNIT/ML ~~LOC~~ SOLN
0.0000 [IU] | Freq: Three times a day (TID) | SUBCUTANEOUS | Status: DC
  Administered 2014-10-26: 1 [IU] via SUBCUTANEOUS
  Administered 2014-10-27: 2 [IU] via SUBCUTANEOUS
  Administered 2014-10-28: 3 [IU] via SUBCUTANEOUS

## 2014-10-25 MED ORDER — SODIUM CHLORIDE 0.9 % IV SOLN
INTRAVENOUS | Status: DC
  Administered 2014-10-25 (×3): via INTRAVENOUS

## 2014-10-25 MED ORDER — ALBUTEROL SULFATE (2.5 MG/3ML) 0.083% IN NEBU: 3.0000 mL | INHALATION_SOLUTION | Freq: Four times a day (QID) | RESPIRATORY_TRACT | Status: DC | PRN

## 2014-10-25 MED ORDER — POLYETHYLENE GLYCOL 3350 17 G PO PACK: 17.0000 g | PACK | Freq: Every day | ORAL | Status: DC | PRN

## 2014-10-25 MED ORDER — OXYCODONE HCL 5 MG PO TABS
ORAL_TABLET | ORAL | Status: AC
  Filled 2014-10-25: qty 2

## 2014-10-25 MED ORDER — ACETAMINOPHEN 650 MG RE SUPP: 650.0000 mg | Freq: Four times a day (QID) | RECTAL | Status: DC | PRN

## 2014-10-25 MED ORDER — VANCOMYCIN HCL IN DEXTROSE 1-5 GM/200ML-% IV SOLN: 1000.0000 mg | INTRAVENOUS | Status: DC

## 2014-10-25 MED ORDER — BUPIVACAINE LIPOSOME 1.3 % IJ SUSP
20.0000 mL | INTRAMUSCULAR | Status: AC
  Administered 2014-10-25: 20 mL
  Filled 2014-10-25: qty 20

## 2014-10-25 MED ORDER — PROMETHAZINE HCL 25 MG/ML IJ SOLN: 6.2500 mg | INTRAMUSCULAR | Status: DC | PRN

## 2014-10-25 MED ORDER — LACTATED RINGERS IV SOLN: INTRAVENOUS | Status: DC

## 2014-10-25 MED ORDER — ONDANSETRON HCL 4 MG PO TABS: 4.0000 mg | ORAL_TABLET | Freq: Four times a day (QID) | ORAL | Status: DC | PRN

## 2014-10-25 MED ORDER — METOCLOPRAMIDE HCL 5 MG/ML IJ SOLN: 5.0000 mg | Freq: Three times a day (TID) | INTRAMUSCULAR | Status: DC | PRN

## 2014-10-25 MED ORDER — HYDROMORPHONE HCL 1 MG/ML IJ SOLN
1.0000 mg | INTRAMUSCULAR | Status: DC | PRN
  Administered 2014-10-25 – 2014-10-27 (×13): 1 mg via INTRAVENOUS
  Filled 2014-10-25 (×13): qty 1

## 2014-10-25 MED ORDER — ACETAMINOPHEN 325 MG PO TABS
650.0000 mg | ORAL_TABLET | Freq: Four times a day (QID) | ORAL | Status: DC | PRN
  Administered 2014-10-26 – 2014-10-27 (×4): 650 mg via ORAL
  Filled 2014-10-25 (×4): qty 2

## 2014-10-25 MED ORDER — BUPIVACAINE HCL (PF) 0.25 % IJ SOLN
INTRAMUSCULAR | Status: AC
  Filled 2014-10-25: qty 30

## 2014-10-25 MED ORDER — GABAPENTIN 400 MG PO CAPS
400.0000 mg | ORAL_CAPSULE | Freq: Every day | ORAL | Status: DC
  Administered 2014-10-25 – 2014-10-28 (×4): 400 mg via ORAL
  Filled 2014-10-25 (×4): qty 1

## 2014-10-25 MED ORDER — METOCLOPRAMIDE HCL 5 MG PO TABS: 5.0000 mg | ORAL_TABLET | Freq: Three times a day (TID) | ORAL | Status: DC | PRN

## 2014-10-25 MED ORDER — PHENYLEPHRINE 40 MCG/ML (10ML) SYRINGE FOR IV PUSH (FOR BLOOD PRESSURE SUPPORT)
PREFILLED_SYRINGE | INTRAVENOUS | Status: AC
  Filled 2014-10-25: qty 20

## 2014-10-25 MED ORDER — MENTHOL 3 MG MT LOZG: 1.0000 | LOZENGE | OROMUCOSAL | Status: DC | PRN

## 2014-10-25 MED ORDER — MONTELUKAST SODIUM 10 MG PO TABS
10.0000 mg | ORAL_TABLET | Freq: Every day | ORAL | Status: DC
  Administered 2014-10-25 – 2014-10-28 (×4): 10 mg via ORAL
  Filled 2014-10-25 (×4): qty 1

## 2014-10-25 MED ORDER — LIDOCAINE HCL (CARDIAC) 20 MG/ML IV SOLN
INTRAVENOUS | Status: DC | PRN
  Administered 2014-10-25: 80 mg via INTRAVENOUS

## 2014-10-25 MED ORDER — AMLODIPINE BESYLATE 5 MG PO TABS
5.0000 mg | ORAL_TABLET | Freq: Every day | ORAL | Status: DC
Start: 2014-10-26 — End: 2014-10-29
  Administered 2014-10-26 – 2014-10-29 (×4): 5 mg via ORAL
  Filled 2014-10-25 (×4): qty 1

## 2014-10-25 MED ORDER — PROPOFOL 10 MG/ML IV BOLUS
INTRAVENOUS | Status: DC | PRN
  Administered 2014-10-25: 150 mg via INTRAVENOUS

## 2014-10-25 MED ORDER — MIDAZOLAM HCL 2 MG/2ML IJ SOLN
INTRAMUSCULAR | Status: AC
  Administered 2014-10-25: 1 mg
  Filled 2014-10-25: qty 2

## 2014-10-25 MED ORDER — OXYCODONE HCL 5 MG PO TABS
5.0000 mg | ORAL_TABLET | ORAL | Status: DC | PRN
  Administered 2014-10-25 – 2014-10-29 (×14): 10 mg via ORAL
  Filled 2014-10-25 (×10): qty 2
  Filled 2014-10-25: qty 1
  Filled 2014-10-25: qty 2

## 2014-10-25 MED ORDER — DOCUSATE SODIUM 100 MG PO CAPS
100.0000 mg | ORAL_CAPSULE | Freq: Two times a day (BID) | ORAL | Status: DC
  Administered 2014-10-25 – 2014-10-29 (×7): 100 mg via ORAL
  Filled 2014-10-25 (×7): qty 1

## 2014-10-25 MED ORDER — CALCIUM ACETATE (PHOS BINDER) 667 MG PO CAPS
667.0000 mg | ORAL_CAPSULE | Freq: Three times a day (TID) | ORAL | Status: DC
  Administered 2014-10-26 – 2014-10-29 (×8): 667 mg via ORAL
  Filled 2014-10-25 (×8): qty 1

## 2014-10-25 MED ORDER — FLUTICASONE PROPIONATE 50 MCG/ACT NA SUSP
2.0000 | Freq: Every day | NASAL | Status: DC
  Administered 2014-10-26 – 2014-10-29 (×4): 2 via NASAL
  Filled 2014-10-25: qty 16

## 2014-10-25 SURGICAL SUPPLY — 58 items
BASEPLATE TIBIA REVIS 6 R/L (Joint) ×1 IMPLANT
BLADE SAW SAG 90X13X1.27 (BLADE) ×3 IMPLANT
BLADE SAW SGTL 13.0X1.19X90.0M (BLADE) ×3 IMPLANT
BNDG COHESIVE 6X5 TAN STRL LF (GAUZE/BANDAGES/DRESSINGS) ×3 IMPLANT
BONE CEMENT PALACOSE (Orthopedic Implant) ×9 IMPLANT
BOWL SMART MIX CTS (DISPOSABLE) IMPLANT
CEMENT BONE PALACOSE (Orthopedic Implant) ×3 IMPLANT
COVER BACK TABLE 24X17X13 BIG (DRAPES) IMPLANT
COVER SURGICAL LIGHT HANDLE (MISCELLANEOUS) ×6 IMPLANT
CUFF TOURNIQUET SINGLE 34IN LL (TOURNIQUET CUFF) IMPLANT
CUFF TOURNIQUET SINGLE 44IN (TOURNIQUET CUFF) IMPLANT
DRAPE EXTREMITY T 121X128X90 (DRAPE) ×3 IMPLANT
DRAPE IMP U-DRAPE 54X76 (DRAPES) ×3 IMPLANT
DRAPE PROXIMA HALF (DRAPES) ×3 IMPLANT
DRAPE U-SHAPE 47X51 STRL (DRAPES) ×3 IMPLANT
DRSG ADAPTIC 3X8 NADH LF (GAUZE/BANDAGES/DRESSINGS) ×3 IMPLANT
DRSG PAD ABDOMINAL 8X10 ST (GAUZE/BANDAGES/DRESSINGS) ×3 IMPLANT
DURAPREP 26ML APPLICATOR (WOUND CARE) ×3 IMPLANT
ELECT REM PT RETURN 9FT ADLT (ELECTROSURGICAL) ×3
ELECTRODE REM PT RTRN 9FT ADLT (ELECTROSURGICAL) ×1 IMPLANT
FACESHIELD WRAPAROUND (MASK) ×3 IMPLANT
FEMORAL COMP TOTAL STABIL KNEE (Knees) ×3 IMPLANT
GAUZE SPONGE 4X4 12PLY STRL (GAUZE/BANDAGES/DRESSINGS) ×3 IMPLANT
GLOVE BIOGEL PI IND STRL 9 (GLOVE) ×1 IMPLANT
GLOVE BIOGEL PI INDICATOR 9 (GLOVE) ×2
GLOVE SURG ORTHO 9.0 STRL STRW (GLOVE) ×6 IMPLANT
GOWN STRL REUS W/ TWL XL LVL3 (GOWN DISPOSABLE) ×3 IMPLANT
GOWN STRL REUS W/TWL XL LVL3 (GOWN DISPOSABLE) ×6
HANDPIECE INTERPULSE COAX TIP (DISPOSABLE)
INSERT TIBIAL STABILIZER 13MM (Knees) ×3 IMPLANT
KIT BASIN OR (CUSTOM PROCEDURE TRAY) ×3 IMPLANT
KIT ROOM TURNOVER OR (KITS) ×3 IMPLANT
KNEE FEM POST 5X6 (Joint) ×6 IMPLANT
MANIFOLD NEPTUNE II (INSTRUMENTS) ×3 IMPLANT
NEEDLE SPNL 18GX3.5 QUINCKE PK (NEEDLE) ×3 IMPLANT
NS IRRIG 1000ML POUR BTL (IV SOLUTION) ×3 IMPLANT
PACK TOTAL JOINT (CUSTOM PROCEDURE TRAY) ×3 IMPLANT
PACK UNIVERSAL I (CUSTOM PROCEDURE TRAY) ×3 IMPLANT
PAD ARMBOARD 7.5X6 YLW CONV (MISCELLANEOUS) ×6 IMPLANT
PADDING CAST COTTON 6X4 STRL (CAST SUPPLIES) ×3 IMPLANT
PATELLA ASYMMETRIC 35X10MM (Knees) ×3 IMPLANT
SET HNDPC FAN SPRY TIP SCT (DISPOSABLE) IMPLANT
SPONGE GAUZE 4X4 12PLY STER LF (GAUZE/BANDAGES/DRESSINGS) ×3 IMPLANT
STAPLER VISISTAT 35W (STAPLE) ×3 IMPLANT
STEM CEMENTED (Stem) ×3 IMPLANT
STEM CEMENTED TRIATHLON (Stem) ×3 IMPLANT
SUCTION FRAZIER TIP 10 FR DISP (SUCTIONS) IMPLANT
SUT VIC AB 0 CTB1 27 (SUTURE) IMPLANT
SUT VIC AB 1 CTX 36 (SUTURE)
SUT VIC AB 1 CTX36XBRD ANBCTR (SUTURE) IMPLANT
SUT VIC AB 2-0 CTB1 (SUTURE) IMPLANT
TIB REV BASEPLATE 6 R/L (Joint) ×3 IMPLANT
TOWEL OR 17X24 6PK STRL BLUE (TOWEL DISPOSABLE) ×3 IMPLANT
TOWEL OR 17X26 10 PK STRL BLUE (TOWEL DISPOSABLE) ×3 IMPLANT
TRAY FOLEY CATH 16FRSI W/METER (SET/KITS/TRAYS/PACK) IMPLANT
TUBE ANAEROBIC SPECIMEN COL (MISCELLANEOUS) ×3 IMPLANT
WATER STERILE IRR 1000ML POUR (IV SOLUTION) ×3 IMPLANT
WRAP KNEE MAXI GEL POST OP (GAUZE/BANDAGES/DRESSINGS) ×3 IMPLANT

## 2014-10-25 NOTE — Plan of Care (Signed)
Problem: Consults Goal: Diagnosis- Total Joint Replacement Revision Total Knee: Left

## 2014-10-25 NOTE — Anesthesia Preprocedure Evaluation (Addendum)
Anesthesia Evaluation  Patient identified by MRN, date of birth, ID band Patient awake    Reviewed: Allergy & Precautions, NPO status , Patient's Chart, lab work & pertinent test results  Airway Mallampati: II  TM Distance: >3 FB Neck ROM: Full    Dental  (+) Teeth Intact   Pulmonary sleep apnea , former smoker,    breath sounds clear to auscultation       Cardiovascular hypertension, Pt. on medications + Past MI and +CHF   Rhythm:Regular Rate:Normal     Neuro/Psych PSYCHIATRIC DISORDERS Anxiety Depression  Neuromuscular disease CVA    GI/Hepatic GERD  ,  Endo/Other  diabetes, Type 2, Oral Hypoglycemic Agents  Renal/GU Renal InsufficiencyRenal disease  negative genitourinary   Musculoskeletal  (+) Arthritis ,   Abdominal   Peds negative pediatric ROS (+)  Hematology negative hematology ROS (+)   Anesthesia Other Findings   Reproductive/Obstetrics negative OB ROS                            Lab Results  Component Value Date   WBC 12.4* 10/07/2014   HGB 11.8* 10/07/2014   HCT 38.0* 10/07/2014   MCV 86.6 10/07/2014   PLT 252 10/07/2014   Lab Results  Component Value Date   CREATININE 7.25* 10/07/2014   BUN 36* 10/07/2014   NA 137 10/07/2014   K 5.4* 10/07/2014   CL 99* 10/07/2014   CO2 25 10/07/2014   Lab Results  Component Value Date   INR 1.15 10/07/2014   INR 1.11 08/07/2014   INR 2.63* 06/08/2014   EKG: normal sinus rhythm.    Anesthesia Physical Anesthesia Plan  ASA: III  Anesthesia Plan: General   Post-op Pain Management: GA combined w/ Regional for post-op pain   Induction: Intravenous  Airway Management Planned: Oral ETT  Additional Equipment:   Intra-op Plan:   Post-operative Plan: Extubation in OR  Informed Consent: I have reviewed the patients History and Physical, chart, labs and discussed the procedure including the risks, benefits and  alternatives for the proposed anesthesia with the patient or authorized representative who has indicated his/her understanding and acceptance.   Dental advisory given  Plan Discussed with: CRNA  Anesthesia Plan Comments:         Anesthesia Quick Evaluation

## 2014-10-25 NOTE — Anesthesia Procedure Notes (Addendum)
Procedure Name: Intubation Date/Time: 10/25/2014 1:38 PM Performed by: Ollen Bowl Pre-anesthesia Checklist: Patient identified, Emergency Drugs available, Suction available, Patient being monitored and Timeout performed Patient Re-evaluated:Patient Re-evaluated prior to inductionOxygen Delivery Method: Circle system utilized and Simple face mask Preoxygenation: Pre-oxygenation with 100% oxygen Intubation Type: IV induction Ventilation: Mask ventilation with difficulty and Oral airway inserted - appropriate to patient size Laryngoscope Size: Miller and 3 Grade View: Grade I Tube type: Oral Tube size: 7.5 mm Number of attempts: 1 Airway Equipment and Method: Patient positioned with wedge pillow and Stylet Placement Confirmation: ETT inserted through vocal cords under direct vision,  positive ETCO2 and breath sounds checked- equal and bilateral Secured at: 23 cm Tube secured with: Tape Dental Injury: Teeth and Oropharynx as per pre-operative assessment    Anesthesia Regional Block:  Adductor canal block  Pre-Anesthetic Checklist: ,, timeout performed, Correct Patient, Correct Site, Correct Laterality, Correct Procedure, Correct Position, site marked, Risks and benefits discussed,  Surgical consent,  Pre-op evaluation,  At surgeon's request and post-op pain management  Laterality: Left  Prep: chloraprep       Needles:  Injection technique: Single-shot  Needle Type: Stimiplex     Needle Length: 5cm 5 cm Needle Gauge: 22 and 22 G    Additional Needles:  Procedures: ultrasound guided (picture in chart) Adductor canal block  Nerve Stimulator or Paresthesia:  Response: biceps flexion,   Additional Responses:   Narrative:  Start time: 10/25/2014 12:25 PM End time: 10/25/2014 12:30 PM Injection made incrementally with aspirations every 5 mL.  Performed by: Personally  Anesthesiologist: Suella Broad D  Additional Notes: Functioning IV was confirmed and monitors  were applied.  A 108mm 22ga Stimuplex needle was used. Sterile prep and drape,hand hygiene and sterile gloves were used.  Negative aspiration and negative test dose prior to incremental administration of local anesthetic. The patient tolerated the procedure well.

## 2014-10-25 NOTE — Op Note (Signed)
10/25/2014  3:29 PM  PATIENT:  Matthew Kline    PRE-OPERATIVE DIAGNOSIS:  Infected Left Total Knee Arthroplasty  POST-OPERATIVE DIAGNOSIS:  Same  PROCEDURE:  LEFT TOTAL KNEE REVISION #1 removal of femoral and tibial antibiotic components.  #2 revision total knee arthroplasty with Zimmer components.  SURGEON:  [name], MD  PHYSICIAN ASSISTANT:None ANESTHESIA:   General  PREOPERATIVE INDICATIONS:  Matthew Kline is a  54 y.o. male with a diagnosis of Infected Left Total Knee Arthroplasty who failed conservative measures and elected for surgical management.    The risks benefits and alternatives were discussed with the patient preoperatively including but not limited to the risks of infection, bleeding, nerve injury, cardiopulmonary complications, the need for revision surgery, among others, and the patient was willing to proceed.  OPERATIVE IMPLANTS: Zimmer implants with a size 6 femur 100 mm stem, size 6 tibia 50 mm stem., 5 mm posterior femoral shaft was medial and laterally. 13 mm polyethylene tray. 35 mm patella  OPERATIVE FINDINGS: No signs of residual infection cultures obtained 2.  OPERATIVE PROCEDURE: Patient was brought to the operating room and underwent a general anesthetic. After adequate levels anesthesia obtained patient's left lower extremity was prepped using DuraPrep draped into a sterile field. A timeout was called. A midline incision was made carried down to the medial parapatellar retinacular incision. The joint was exposed and the femoral and tibial antibiotic components were removed. Attention was first focused on the tibia. The tibia sequentially reamed to 14 mm for a size 12 stem. This was then set to cut 2 mm of bone with neutral varus and valgus tilt. Attention was then focused on the femur. The femur was sequentially broached to a size 14 mm for a size 12 mm stem. The trial guides were used and the distal femoral cut was made followed by the chamfer  and box cuts. This was then trialed with a 13 mm polyethylene tray there was space in the posterior femoral condyles and the 5 mm spacers were placed medially and laterally. This was tried the the knee was stable varus and valgus with the 13 mm polyethylene. The patella was then resurfaced for a 35 mm patella. The knee was irrigated with pulsatile lavage. The joint was filled with trans-Amick acid for hemostasis in the popliteal fossa was injected with X Burrell. The tibial component and tibial tray were cemented in place femoral component was cemented in place all loose cement was removed and the knee was left in extension until the cement hardened. The knee tracked well and a lateral release was performed for the patella to track midline. Further irrigation debridement was performed the retinaculum was closed using #1 Vicryl subcutaneous is closed using 0 Vicryl the skin was closed using staples. A sterile compressive dressing was applied. Patient was extubated taken to the PACU in stable condition.

## 2014-10-25 NOTE — H&P (Signed)
TOTAL KNEE REVISION ADMISSION H&P  Patient is being admitted for left revision total knee arthroplasty.  Subjective:  Chief Complaint:left knee pain.  HPI: Matthew Kline, 54 y.o. male, has a history of pain and functional disability in the left knee(s) due to failed previous arthroplasty and patient has failed non-surgical conservative treatments for greater than 12 weeks to include use of assistive devices and activity modification. The indications for the revision of the total knee arthroplasty are history of total knee infection. Onset of symptoms was abrupt starting 8 years ago with gradually worsening course since that time.  Prior procedures on the left knee(s) include arthroplasty.  Patient currently rates pain in the left knee(s) at 8 out of 10 with activity. There is night pain, worsening of pain with activity and weight bearing, pain that interferes with activities of daily living and pain with passive range of motion.  Patient has evidence of prosthetic loosening by imaging studies. This condition presents safety issues increasing the risk of falls. This patient has had Infected total knee arthroplasty and presents at this time after staged reconstruction for revision total knee..  There is no current active infection.  Patient Active Problem List   Diagnosis Date Noted  . Septic joint of left knee joint ([place]) 08/07/2014  . Tachycardia 07/24/2014  . Acute upper respiratory infection 07/24/2014  . Sepsis ([place]) 07/14/2014  . ESRD (end stage renal disease) ([place]) 07/14/2014  . Type II diabetes mellitus ([place]) 07/14/2014  . Anemia in chronic kidney disease 07/14/2014  . 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 Surgical wound dehiscence 05/09/2014  . Dehiscence of closure of skin 05/09/2014  . Total knee replacement status 04/10/2014  . Diabetes mellitus with renal manifestations, controlled ([place]) 10/24/2013  . Hypertensive renal disease 06/27/2013  . DM type 2  causing vascular disease ([place]) 06/27/2013  . Erectile dysfunction 06/27/2013  . Depression 06/27/2013  . Claudication of left lower extremity ([place]) 12/19/2012  . Essential hypertension, benign 12/19/2012  . Sinusitis, acute maxillary 11/22/2012  . Otitis, externa, infective 11/14/2012  . Leg edema, left 11/14/2012  . End stage renal disease ([place]) 10/02/2012  . Controlled type 2 DM with proteinuria or microalbuminuria 09/19/2012  . GERD (gastroesophageal reflux disease) 09/19/2012  . Leukocytosis, unspecified 09/19/2012  . Lacunar infarction ([place]) 08/17/2012  . Polymyalgia rheumatica ([place]) 08/17/2012  . 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 Unspecified essential hypertension 05/10/2012  . Unspecified vitamin D deficiency 05/10/2012  . Diabetes mellitus due to underlying condition ([place]) 05/10/2012  . Hyperlipidemia LDL goal <100 05/10/2012  . Anemia of chronic disease 05/10/2012  . Screening for prostate cancer 05/10/2012  . Chronic kidney disease (CKD), stage IV (severe) ([place]) 05/10/2012  . Peripheral autonomic neuropathy due to DM ([place]) 05/10/2012  . Callus of foot 05/10/2012  . Urgency of urination 05/10/2012  . Hyperkalemia 05/10/2012  . Candidiasis of the esophagus 10/12/2011  . Internal hemorrhoids without mention of complication [id number]  . Pre-syncope 07/25/2009  . DJD (degenerative joint disease) of cervical spine 02/17/2009   Past Medical History  Diagnosis Date  . Polymyalgia rheumatica ([place])   . Hypertension   . Diabetic neuropathy ([place])   . Osteomyelitis of foot, left, acute ([place])   . Anxiety   . Insomnia, unspecified   . Unspecified vitamin D deficiency   . Anemia, unspecified   . Other chronic postoperative pain   . Allergy   . Unspecified osteomyelitis, site unspecified   . Long term (current) use of anticoagulants   .  Lacunar infarction ([place]) 2006    RUE/RLE, speech  . Ulcer     diabetic foot   . Arthralgia 2010    polyarticular  . Hemorrhoids,  internal 10/2011    small  . CHF (congestive heart failure) ([place])   . CHF (congestive heart failure) ([place]) 07/25/2009    denies  . Hemodialysis access site with mature fistula ([place])   . Myocardial infarction ([place]) 1995  . Pneumonia     "probably 4-5 times" (05/09/2014)  . Type II diabetes mellitus ([place]) dx'd 1995  . History of blood transfusion     "related to the anemia"  . GERD (gastroesophageal reflux disease)     hx "before I lost weight"  . Arthritis     "back, knees" (05/09/2014)  . Chronic lower back pain   . Stroke ([place]) 01/10/06    denies residual on 05/09/2014  . Sleep apnea     "lost weight; no more problem" (05/09/2014)  . Coughing     pt. reports that he has drainage from sinus infection  . ESRD (end stage renal disease) on dialysis [facility])     started 12/2012; "MWF; [facility] "   . Renal insufficiency   . Unspecified hereditary and idiopathic peripheral neuropathy     feet    Past Surgical History  Procedure Laterality Date  . Amputation  01/21/2012    Procedure: AMPUTATION RAY;  Surgeon: [name], MD;  Location: [facility];  Service: Orthopedics;  Laterality: Left;  Left Foot 4th Ray Amputation  . Anterior cervical decomp/discectomy fusion  02/2011  . Knee arthroscopy Left 08-25-2012  . Toe amputation Bilateral     "I've lost 7 toes over the last 7 years" (05/09/2014)  . Refractive surgery Bilateral   . Bascilic vein transposition Left 10/19/2012    Procedure: BASCILIC VEIN TRANSPOSITION;  Surgeon: Serafina Mitchell, MD;  Location: [facility];  Service: Vascular;  Laterality: Left;  . Tonsillectomy    . Amputation Left 05/04/2013    Procedure: AMPUTATION DIGIT;  Surgeon: [name], MD;  Location: [facility];  Service: Orthopedics;  Laterality: Left;  Left Great Toe Amputation at [facility]  . Toe surgery Left April 2015    Big toe removed on left foot.  . Total knee arthroplasty Left 04/10/2014    Procedure: TOTAL KNEE ARTHROPLASTY;  Surgeon: [name], MD;  Location: [facility];  Service: Orthopedics;  Laterality: Left;  . Wound debridement Left 05/09/2014    Dehiscence Left Total Knee Arthroplasty Incision  . Back surgery    . Uvulopalatopharyngoplasty, tonsillectomy and septoplasty  ~ 1989  . I&d extremity Left 05/09/2014    Procedure: Irrigation and Debridement Left Knee and Closure of Total Knee Arthroplasty Incision;  Surgeon: [name], MD;  Location: [facility];  Service: Orthopedics;  Laterality: Left;  . I&d knee with poly exchange Left 05/31/2014    Procedure: IRRIGATION AND DEBRIDEMENT LEFT KNEE, PLACE ANTIBIOTIC BEADS,  POLY EXCHANGE;  Surgeon: [name], MD;  Location: [facility];  Service: Orthopedics;  Laterality: Left;  . Excisional total knee arthroplasty with antibiotic spacers Left 08/07/2014    Procedure: Replace Left Total Knee Arthroplasty,  Place Antibiotic Spacer;  Surgeon: [name], MD;  Location: [facility];  Service: Orthopedics;  Laterality: Left;    No prescriptions prior to admission   Allergies  Allergen Reactions  . Morphine And Related Other (See Comments)    hallucinations  . Tygacil [Tigecycline] Nausea And Vomiting  and Other (See Comments)    Makes him feel crazy    Social History  Substance Use Topics  . Smoking status: Former Smoker -- 0.12 packs/day for 32 years    Types: Cigarettes  . Smokeless tobacco: Former Systems developer    Quit date: 10/06/2014     Comment: 3 -4 cig daily   . Alcohol Use: No    Family History  Problem Relation Age of Onset  . Hypertension Mother   . Cancer Mother 25    Ovarian  . Heart disease Maternal Aunt   . Stroke Maternal Grandfather       Review of Systems  All other systems reviewed and are negative.    Objective:  Physical Exam  Vital signs in last 24 hours:    Labs:  Estimated body mass index is 27.88 kg/(m^2) as calculated from the following:   Height as of 09/06/14: 5\' 11"  (1.803 m).   Weight as of 09/06/14: 90.629 kg (199 lb 12.8 oz).  Imaging Review Plain radiographs  demonstrate mild degenerative joint disease of the left knee(s). The overall alignment is neutral.There is evidence of loosening of the femoral and tibial components. The bone quality appears to be adequate for age and reported activity level. There is a antibiotic spacer for both the tibia and fibula.  Assessment/Plan:  End stage arthritis, left knee(s) with failed previous arthroplasty.   The patient history, physical examination, clinical judgment of the provider and imaging studies are consistent with end stage degenerative joint disease of the left knee(s), previous total knee arthroplasty. Revision total knee arthroplasty is deemed medically necessary. The treatment options including medical management, injection therapy, arthroscopy and revision arthroplasty were discussed at length. The risks and benefits of revision total knee arthroplasty were presented and reviewed. The risks due to aseptic loosening, infection, stiffness, patella tracking problems, thromboembolic complications and other imponderables were discussed. The patient acknowledged the explanation, agreed to proceed with the plan and consent was signed. Patient is being admitted for inpatient treatment for surgery, pain control, PT, OT, prophylactic antibiotics, VTE prophylaxis, progressive ambulation and ADL's and discharge planning.The patient is planning to be discharged to skilled nursing facility

## 2014-10-25 NOTE — Anesthesia Postprocedure Evaluation (Signed)
  Anesthesia Post-op Note  Patient: Matthew Kline  Procedure(s) Performed: Procedure(s): LEFT TOTAL KNEE REVISION (Left)  Patient Location: PACU  Anesthesia Type:General and block  Level of Consciousness: awake and alert   Airway and Oxygen Therapy: Patient Spontanous Breathing  Post-op Pain: Controlled  Post-op Assessment: Post-op Vital signs reviewed, Patient's Cardiovascular Status Stable and Respiratory Function Stable  Post-op Vital Signs: Reviewed  Filed Vitals:   10/25/14 1718  BP:   Pulse:   Temp: 36.3 C  Resp:     Complications: No apparent anesthesia complications

## 2014-10-25 NOTE — Transfer of Care (Signed)
Immediate Anesthesia Transfer of Care Note  Patient: Matthew Kline  Procedure(s) Performed: Procedure(s): LEFT TOTAL KNEE REVISION (Left)  Patient Location: PACU  Anesthesia Type:GA combined with regional for post-op pain  Level of Consciousness: sedated  Airway & Oxygen Therapy: Patient Spontanous Breathing and Patient connected to nasal cannula oxygen  Post-op Assessment: Report given to RN and Post -op Vital signs reviewed and stable  Post vital signs: stable  Last Vitals:  Filed Vitals:   10/25/14 1200  BP: 150/84  Pulse: 91  Temp:   Resp: 28    Complications: No apparent anesthesia complications

## 2014-10-26 LAB — CBC
HCT: 30.5 % — ABNORMAL LOW (ref 39.0–52.0)
Hemoglobin: 9.2 g/dL — ABNORMAL LOW (ref 13.0–17.0)
MCH: 26.1 pg (ref 26.0–34.0)
MCHC: 30.2 g/dL (ref 30.0–36.0)
MCV: 86.6 fL (ref 78.0–100.0)
PLATELETS: 216 10*3/uL (ref 150–400)
RBC: 3.52 MIL/uL — ABNORMAL LOW (ref 4.22–5.81)
RDW: 19.3 % — AB (ref 11.5–15.5)
WBC: 9.5 10*3/uL (ref 4.0–10.5)

## 2014-10-26 LAB — GLUCOSE, CAPILLARY
GLUCOSE-CAPILLARY: 50 mg/dL — AB (ref 65–99)
GLUCOSE-CAPILLARY: 70 mg/dL (ref 65–99)
Glucose-Capillary: 127 mg/dL — ABNORMAL HIGH (ref 65–99)
Glucose-Capillary: 148 mg/dL — ABNORMAL HIGH (ref 65–99)
Glucose-Capillary: 68 mg/dL (ref 65–99)
Glucose-Capillary: 124 mg/dL — ABNORMAL HIGH (ref 65–99)

## 2014-10-26 LAB — RENAL FUNCTION PANEL
Albumin: 2.3 g/dL — ABNORMAL LOW (ref 3.5–5.0)
Anion gap: 14 (ref 5–15)
BUN: 51 mg/dL — AB (ref 6–20)
CHLORIDE: 104 mmol/L (ref 101–111)
CO2: 20 mmol/L — AB (ref 22–32)
CREATININE: 8.81 mg/dL — AB (ref 0.61–1.24)
Calcium: 8.6 mg/dL — ABNORMAL LOW (ref 8.9–10.3)
GFR calc non Af Amer: 6 mL/min — ABNORMAL LOW (ref >60)
GFR, EST AFRICAN AMERICAN: 7 mL/min — AB (ref >60)
Glucose, Bld: 81 mg/dL (ref 65–99)
Phosphorus: 9 mg/dL — ABNORMAL HIGH (ref 2.5–4.6)
Potassium: 5 mmol/L (ref 3.5–5.1)
Sodium: 138 mmol/L (ref 135–145)

## 2014-10-26 LAB — HEMOGLOBIN A1C
HEMOGLOBIN A1C: 6 % — AB (ref 4.8–5.6)
Mean Plasma Glucose: 126 mg/dL

## 2014-10-26 MED ORDER — OXYCODONE HCL 5 MG PO TABS
ORAL_TABLET | ORAL | Status: AC
  Administered 2014-10-26: 10 mg via ORAL
  Filled 2014-10-26: qty 2

## 2014-10-26 MED ORDER — SODIUM CHLORIDE 0.9 % IV SOLN
125.0000 mg | INTRAVENOUS | Status: DC
  Administered 2014-10-28: 125 mg via INTRAVENOUS
  Filled 2014-10-26: qty 10

## 2014-10-26 NOTE — Progress Notes (Signed)
PT Cancellation Note  Patient Details Name: Matthew Kline MRN: [id number] DOB: September 30, 1960   Cancelled Treatment:    Reason Eval/Treat Not Completed: Patient at procedure or test/unavailable. Pt off floor at HD. PT to return as able.   Kingsley Callander 10/26/2014, 2:13 PM  Kittie Plater, PT, DPT Pager #: [phone] Office #: [phone]

## 2014-10-26 NOTE — Progress Notes (Signed)
Orthopedic Tech Progress Note Patient Details:  Matthew Kline [birth year] [id number]  Ortho Devices Type of Ortho Device: Knee Immobilizer Ortho Device/Splint Interventions: Application   [name] 10/26/2014, 11:16 AM

## 2014-10-26 NOTE — Progress Notes (Signed)
Pt CBG 68, no s/s of hypoglycemia but refusing to answer questions because he is streaming on his cell phone.  Gave pt crackers, peanut butter and ginger ale, pt consumed, dinner trays were 1 1/2 late on floor.   CBG 70.  Reported to second shift RN, pt continues to show no s/s of hypoglycemia.

## 2014-10-26 NOTE — Progress Notes (Signed)
Pt's CBG is 124. Pt resting comfortably. Will continue to monitor.

## 2014-10-26 NOTE — Progress Notes (Signed)
Patient's CBG is 50, shows no s/s of hypoglycemia although pt refuses to get off of his computer for RN to assess.  After this RN instructed pt and attempted to take computer away for protocol, pt refused.  OJ, peanut butter, crackers were given to patient along with Diludid 1mg  IV PRN for pain of 9.  Will re-check CBG in 15 min.  Patient was in dialysis, returned to floor at 17:10.  Nsg to continue to monitor for status changes.

## 2014-10-26 NOTE — Progress Notes (Signed)
Hemodialysis-Pt signed off AMA after 3 hours of treatment. No complaints. "need to get back to my kids." Report called to primary RN.

## 2014-10-26 NOTE — Progress Notes (Signed)
[name], male   DOB: 1960-05-19, 54 y.o.   MRN: [id number] No acute changes following surgery yesterday.  Dressing over left knee clean and dry.  Calf soft, foot well-perfused.  Dorsiflex/plantar flex intact.  Therapy for mobility

## 2014-10-26 NOTE — Evaluation (Signed)
Occupational Therapy Evaluation Patient Details Name: Matthew Kline MRN: [id number] DOB: January 02, 1961 Today's Date: 10/26/2014    History of Present Illness Matthew Kline, 54 y.o. male, has a history of pain and functional disability in the left knee(s) due to failed previous arthroplasty and patient has failed non-surgical conservative treatments for greater than 12 weeks to include use of assistive devices and activity modification. The indications for the revision of the total knee arthroplasty are history of total knee infection.   Clinical Impression   Pt is a 54 y/o male s/p L TKR whom presents with deficits as listed below (see OT Problem section). He should benefit from OT acutely to assist in maximizing independence with ADL's and functional transfers w/ focus on tub transfer next available date. He is currently min guard assist during functional mobility.    Follow Up Recommendations  No OT follow up;Supervision - Intermittent    Equipment Recommendations  Other (comment);None recommended by OT (Pt declines need for 3:1 "I don't need that ")    Recommendations for Other Services       Precautions / Restrictions Precautions Precautions: Fall Required Braces or Orthoses: Knee Immobilizer - Left Knee Immobilizer - Left: On when out of bed or walking Restrictions Weight Bearing Restrictions: Yes LLE Weight Bearing: Weight bearing as tolerated      Mobility Bed Mobility               General bed mobility comments: Pt up in chair upon OT arrival  Transfers Overall transfer level: Needs assistance Equipment used: Rolling walker (2 wheeled) Transfers: Sit to/from [employer] Transfers Sit to Stand: Min guard Stand pivot transfers: Min guard       General transfer comment: pt demo'd good technique and walker management    Balance Overall balance assessment: No apparent balance deficits (not formally assessed)                                           ADL Overall ADL's : Needs assistance/impaired     Grooming: Wash/dry hands;Wash/dry face;Min guard;Standing Grooming Details (indicate cue type and reason): Standing at sink Upper Body Bathing: Modified independent;Set up;Sitting   Lower Body Bathing: Min guard;Sit to/from stand;Supervison/ safety   Upper Body Dressing : Modified independent;Set up;Sitting   Lower Body Dressing: Minimal assistance;Sit to/from stand   Toilet Transfer: Min guard;Ambulation;RW;Comfort height toilet;Grab bars   Toileting- Clothing Manipulation and Hygiene: Min guard;Sit to/from stand       Functional mobility during ADLs: Rolling walker;Cueing for sequencing;Cueing for safety;Supervision/safety General ADL Comments: Pt was educated in Role of OT and participated in ADL retraining session today to consist of transfers, functional mobility and grooming standing at sink. Pt requires frequent redirection to tasks as he is hyperverbal and preoccupied with personal issues.      Vision  No change from baseline   Perception     Praxis      Pertinent Vitals/Pain Pain Assessment: 0-10 Pain Score: 8  Pain Location: L KNEE Pain Descriptors / Indicators: Aching;Sore Pain Intervention(s): Monitored during session;Limited activity within patient's tolerance;Repositioned     Hand Dominance Right   Extremity/Trunk Assessment Upper Extremity Assessment Upper Extremity Assessment: Overall WFL for tasks assessed   Lower Extremity Assessment Lower Extremity Assessment: Defer to PT evaluation LLE Deficits / Details: able to initiate quad set   Cervical / Trunk Assessment Cervical / Trunk Assessment:  Normal   Communication Communication Communication: No difficulties   Cognition Arousal/Alertness: Awake/alert Behavior During Therapy: WFL for tasks assessed/performed (very distrated with personal issues and hyperverbal) Overall Cognitive Status: Within Functional Limits for  tasks assessed                     General Comments       Exercises Exercises: Total Joint     Shoulder Instructions      Home Living Family/patient expects to be discharged to:: Private residence Living Arrangements: Spouse/significant other Available Help at Discharge: Family Type of Home: House Home Access: Stairs to enter Technical brewer of Steps: 3 Entrance Stairs-Rails: None Home Layout: One level     Bathroom Shower/Tub: Tub/shower unit Shower/tub characteristics: Architectural technologist: Handicapped height Bathroom Accessibility: Yes   Home Equipment: Environmental consultant - 2 wheels;Grab bars - tub/shower          Prior Functioning/Environment Level of Independence: Needs assistance  Gait / Transfers Assistance Needed: used RW due to having spacer          OT Diagnosis: Acute pain   OT Problem List: Pain;Decreased knowledge of use of DME or AE   OT Treatment/Interventions: Self-care/ADL training;DME and/or AE instruction;Patient/family education    OT Goals(Current goals can be found in the care plan section) Acute Rehab OT Goals Patient Stated Goal: home Time For Goal Achievement: 11/09/14 Potential to Achieve Goals: Good  OT Frequency: Min 2X/week   Barriers to D/C:            Co-evaluation              End of Session Equipment Utilized During Treatment: Gait belt;Rolling walker  Activity Tolerance: Patient tolerated treatment well Patient left: in chair;with call bell/phone within reach;with nursing/sitter in room   Time: 0920-0958 OT Time Calculation (min): 38 min Charges:  OT General Charges $OT Visit: 1 Procedure OT Evaluation $Initial OT Evaluation Tier I: 1 Procedure OT Treatments $Self Care/Home Management : 8-22 mins G-Codes:    Josephine Igo Dixon, OTR/L 10/26/2014, 10:07 AM

## 2014-10-26 NOTE — Evaluation (Signed)
Physical Therapy Evaluation Patient Details Name: Matthew Kline MRN: [id number] DOB: 03/10/1960 Today's Date: 10/26/2014   History of Present Illness  Cassian Liszewski, 54 y.o. male, has a history of pain and functional disability in the left knee(s) due to failed previous arthroplasty and patient has failed non-surgical conservative treatments for greater than 12 weeks to include use of assistive devices and activity modification. The indications for the revision of the total knee arthroplasty are history of total knee infection.  Clinical Impression  Pt is s/p TKA resulting in the deficits listed below (see PT Problem List). Pt known to service as pt with original L TKA 3/31 then with multiple I and D's, leading to spacer placement in July and know L TKA. Ambulation limited to 54' due to not having KI. Order placed so PT can progress ambulation this afternoon during second session. Pt will benefit from skilled PT to increase their independence and safety with mobility to allow discharge to the venue listed below.      Follow Up Recommendations Home health PT;Supervision/Assistance - 24 hour    Equipment Recommendations  None recommended by PT    Recommendations for Other Services       Precautions / Restrictions Precautions Precautions: Fall Required Braces or Orthoses: Knee Immobilizer - Left Knee Immobilizer - Left: On when out of bed or walking Restrictions Weight Bearing Restrictions: Yes LLE Weight Bearing: Weight bearing as tolerated      Mobility  Bed Mobility               General bed mobility comments: pt sitting EOB upon PT arrival  Transfers Overall transfer level: Needs assistance Equipment used: Rolling walker (2 wheeled) Transfers: Sit to/from Stand Sit to Stand: Min guard         General transfer comment: pt demo'd good technique and walker management  Ambulation/Gait Ambulation/Gait assistance: Min assist Ambulation Distance (Feet): 15  Feet Assistive device: Rolling walker (2 wheeled) Gait Pattern/deviations: Step-to pattern;Decreased stance time - left Gait velocity: decreased   General Gait Details: v/c's to perform L quad set during stance phase to prevent buckling. Limited ambulation due to not having KI. has Therapist, sports order from [facility]    Modified Rankin (Stroke Patients Only)       Balance                                             Pertinent Vitals/Pain Pain Assessment: 0-10 Pain Score: 8  Pain Location: L knee    Home Living Family/patient expects to be discharged to:: Private residence Living Arrangements: Spouse/significant other Available Help at Discharge: Family Type of Home: House Home Access: Stairs to enter Entrance Stairs-Rails: None Technical brewer of Steps: 3 Home Layout: One level Home Equipment: Environmental consultant - 2 wheels      Prior Function Level of Independence: Needs assistance   Gait / Transfers Assistance Needed: used RW due to having spacer           Hand Dominance   Dominant Hand: Right    Extremity/Trunk Assessment   Upper Extremity Assessment: Overall WFL for tasks assessed           Lower Extremity Assessment: LLE deficits/detail   LLE Deficits / Details: able to initiate quad set  Cervical / Trunk Assessment: Normal  Communication   Communication: No difficulties  Cognition Arousal/Alertness: Awake/alert Behavior During Therapy: WFL for tasks assessed/performed (very distracted with personal issues, hyperverbal) Overall Cognitive Status: Within Functional Limits for tasks assessed                      General Comments General comments (skin integrity, edema, etc.): noted L LE edema, pt does go to HD    Exercises Total Joint Exercises Ankle Circles/Pumps: AROM;Both;10 reps Quad Sets: AROM;Left;10 reps Heel Slides: AAROM;Left;10 reps;Seated Goniometric ROM: 30 deg of AA L kene  flexion      Assessment/Plan    PT Assessment Patient needs continued PT services  PT Diagnosis Difficulty walking;Acute pain   PT Problem List Decreased strength;Decreased range of motion;Decreased activity tolerance;Decreased balance;Decreased mobility  PT Treatment Interventions DME instruction;Gait training;Stair training;Functional mobility training;Therapeutic activities;Therapeutic exercise;Balance training;Neuromuscular re-education   PT Goals (Current goals can be found in the Care Plan section) Acute Rehab PT Goals Patient Stated Goal: home PT Goal Formulation: With patient Time For Goal Achievement: 11/02/14 Potential to Achieve Goals: Good    Frequency 7X/week   Barriers to discharge        Co-evaluation               End of Session Equipment Utilized During Treatment: Gait belt (awaiting L KI) Activity Tolerance: Patient tolerated treatment well Patient left: in chair;with call bell/phone within reach;with chair alarm set Nurse Communication: Mobility status         Time: [id number] PT Time Calculation (min) (ACUTE ONLY): 30 min   Charges:   PT Evaluation $Initial PT Evaluation Tier I: 1 Procedure PT Treatments $Therapeutic Exercise: 8-22 mins   PT G CodesKingsley Callander 10/26/2014, 9:35 AM  Kittie Plater, PT, DPT Pager #: [phone] Office #: [phone]

## 2014-10-26 NOTE — Consult Note (Signed)
[facility] Renal Consultation Note  Indication for Consultation:  Management of ESRD/hemodialysis; anemia, hypertension/volume and secondary hyperparathyroidism  HPI: Matthew Kline is a 54 y.o. male admitted with worsening of pain with activity and weight bearing left knee,   Per Dr. Sharol Given had  evidence of prosthetic loosening by imaging studies. He has had  Prior procedures of left knee arthroplasty  This patient has had Infected total knee arthroplasty and presents at this time after staged reconstruction for revision total knee.. Per Dr. Sharol Given  there is no current active infection. Yesterday  Dr. Sharol Given preformed  Left total knee revision with total knee arthroplasty with Zimmer components with removal of femoral and tibial antibiotic components. Last HD as op was wed 10/23/14  At [facility] .       Past Medical History  Diagnosis Date  . Polymyalgia rheumatica ([place])   . Hypertension   . Diabetic neuropathy ([place])   . Osteomyelitis of foot, left, acute ([place])   . Anxiety   . Insomnia, unspecified   . Unspecified vitamin D deficiency   . Anemia, unspecified   . Other chronic postoperative pain   . Allergy   . Unspecified osteomyelitis, site unspecified   . Long term (current) use of anticoagulants   . 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 Ulcer     diabetic foot   . Arthralgia 2010    polyarticular  . Hemorrhoids, internal 10/2011    small  . CHF (congestive heart failure) ([place])   . CHF (congestive heart failure) ([place]) 07/25/2009    denies  . Hemodialysis access site with mature fistula ([place])   . Myocardial infarction ([place]) 1995  . Pneumonia     "probably 4-5 times" (05/09/2014)  . Type II diabetes mellitus ([place]) dx'd 1995  . History of blood transfusion     "related to the anemia"  . GERD (gastroesophageal reflux disease)     hx "before I lost weight"  . Arthritis     "back, knees" (05/09/2014)  . Chronic lower back pain   . Stroke  ([place]) 01/10/06    denies residual on 05/09/2014  . Sleep apnea     "lost weight; no more problem" (05/09/2014)  . Coughing     pt. reports that he has drainage from sinus infection  . ESRD (end stage renal disease) on dialysis [facility])     started 12/2012; "MWF; [facility] "   . Renal insufficiency   . Unspecified hereditary and idiopathic peripheral neuropathy     feet    Past Surgical History  Procedure Laterality Date  . Amputation  01/21/2012    Procedure: AMPUTATION RAY;  Surgeon: [name], MD;  Location: [facility];  Service: Orthopedics;  Laterality: Left;  Left Foot 4th Ray Amputation  . Anterior cervical decomp/discectomy fusion  02/2011  . Knee arthroscopy Left 08-25-2012  . Toe amputation Bilateral     "I've lost 7 toes over the last 7 years" (05/09/2014)  . Refractive surgery Bilateral   . Bascilic vein transposition Left 10/19/2012    Procedure: BASCILIC VEIN TRANSPOSITION;  Surgeon: Serafina Mitchell, MD;  Location: [facility];  Service: Vascular;  Laterality: Left;  . Tonsillectomy    . Amputation Left 05/04/2013    Procedure: AMPUTATION DIGIT;  Surgeon: [name], MD;  Location: [facility];  Service: Orthopedics;  Laterality: Left;  Left Great Toe Amputation at [facility]  . Toe surgery Left April 2015  Big toe removed on left foot.  . Total knee arthroplasty Left 04/10/2014    Procedure: TOTAL KNEE ARTHROPLASTY;  Surgeon: [name], MD;  Location: [facility];  Service: Orthopedics;  Laterality: Left;  . Wound debridement Left 05/09/2014    Dehiscence Left Total Knee Arthroplasty Incision  . Back surgery    . Uvulopalatopharyngoplasty, tonsillectomy and septoplasty  ~ 1989  . I&d extremity Left 05/09/2014    Procedure: Irrigation and Debridement Left Knee and Closure of Total Knee Arthroplasty Incision;  Surgeon: [name], MD;  Location: [facility];  Service: Orthopedics;  Laterality: Left;  . I&d knee with poly exchange Left 05/31/2014    Procedure: IRRIGATION AND DEBRIDEMENT LEFT  KNEE, PLACE ANTIBIOTIC BEADS,  POLY EXCHANGE;  Surgeon: [name], MD;  Location: [facility];  Service: Orthopedics;  Laterality: Left;  . Excisional total knee arthroplasty with antibiotic spacers Left 08/07/2014    Procedure: Replace Left Total Knee Arthroplasty,  Place Antibiotic Spacer;  Surgeon: [name], MD;  Location: [facility];  Service: Orthopedics;  Laterality: Left;      Family History  Problem Relation Age of Onset  . Hypertension Mother   . Cancer Mother 63    Ovarian  . Heart disease Maternal Aunt   . Stroke Maternal Grandfather   social = lives with wife and    reports that he has quit smoking. His smoking use included Cigarettes. He has a 3.84 pack-year smoking history. He quit smokeless tobacco use about 2 weeks ago. He reports that he does not drink alcohol or use illicit drugs.   Allergies  Allergen Reactions  . Morphine And Related Other (See Comments)    hallucinations  . Tygacil [Tigecycline] Nausea And Vomiting and Other (See Comments)    Makes him feel crazy    Prior to Admission medications   Medication Sig Start Date End Date Taking? Authorizing Provider  albuterol (PROVENTIL HFA;VENTOLIN HFA) 108 (90 BASE) MCG/ACT inhaler Inhale 2 puffs into the lungs every 6 (six) hours as needed for wheezing or shortness of breath. 10/09/14  Yes Monica Carter, DO  amLODipine (NORVASC) 5 MG tablet Take 5 mg by mouth daily.  02/22/14  Yes Historical Provider, MD  calcium acetate (PHOSLO) 667 MG capsule Take 667 mg by mouth 3 (three) times daily with meals.   Yes Historical Provider, MD  cefUROXime (CEFTIN) 500 MG tablet Take 1 tab po after dialysis MWF x 10 doses 10/09/14  Yes Gildardo Cranker, DO  Cholecalciferol (VITAMIN D PO) Take 1 tablet by mouth every Monday, Wednesday, and Friday. Take at dialysis   Yes Historical Provider, MD  fexofenadine (ALLEGRA) 180 MG tablet Take 1 tablet (180 mg total) by mouth daily. 08/29/14  Yes Monica Carter, DO  fluticasone (FLONASE) 50 MCG/ACT  nasal spray Place 2 sprays into both nostrils daily. 10/09/14  Yes Gildardo Cranker, DO  gabapentin (NEURONTIN) 100 MG capsule Take 400 mg by mouth at bedtime.  07/12/14  Yes Monica Carter, DO  glipiZIDE (GLUCOTROL) 5 MG tablet Take 1 tablet (5 mg total) by mouth daily before breakfast. 10/24/13  Yes Mahima Pandey, MD  ibuprofen (ADVIL,MOTRIN) 200 MG tablet Take 200 mg by mouth daily as needed (pain).   Yes Historical Provider, MD  montelukast (SINGULAIR) 10 MG tablet Take 1 tablet (10 mg total) by mouth at bedtime. 10/09/14  Yes Gildardo Cranker, DO  vancomycin (VANCOCIN) 1 GM/200ML SOLN Inject 200 mLs (1,000 mg total) into the vein every Monday, Wednesday, and Friday with  hemodialysis. 08/12/14  Yes [name], MD  zolpidem (AMBIEN) 10 MG tablet Take 1 tablet (10 mg total) by mouth at bedtime as needed for sleep. 09/17/14  Yes Lauree Chandler, NP  sildenafil (VIAGRA) 25 MG tablet Take 2 tablets (50 mg total) by mouth daily as needed for erectile dysfunction. 06/27/13   Blanchie Serve, MD    [id number] **OR** acetaminophen, albuterol, bisacodyl, HYDROmorphone (DILAUDID) injection, magnesium citrate, menthol-cetylpyridinium **OR** phenol, metoCLOPramide **OR** metoCLOPramide (REGLAN) injection, ondansetron **OR** ondansetron (ZOFRAN) IV, oxyCODONE, polyethylene glycol, zolpidem  Results for orders placed or performed during the hospital encounter of 10/25/14 (from the past 48 hour(s))  Glucose, capillary     Status: Abnormal   Collection Time: 10/25/14 11:12 AM  Result Value Ref Range   Glucose-Capillary 141 (H) 65 - 99 mg/dL  Type and screen All Cardiac and thoracic surgeries, spinal fusions, myomectomies, craniotomies, colon & liver resections, total joint revisions, same day c-section with placenta previa or accreta.     Status: None   Collection Time: 10/25/14 11:55 AM  Result Value Ref Range   ABO/RH(D) O NEG    Antibody Screen NEG    Sample Expiration 10/28/2014   Hemoglobin A1c     Status:  Abnormal   Collection Time: 10/25/14 12:04 PM  Result Value Ref Range   Hgb A1c MFr Bld 6.0 (H) 4.8 - 5.6 %    Comment: (NOTE)         Pre-diabetes: 5.7 - 6.4         Diabetes: >6.4         Glycemic control for adults with diabetes: <7.0    Mean Plasma Glucose 126 mg/dL    Comment: (NOTE) Performed At: [facility] [place], [place] [id number] Lindon Romp MD [id number]   I-STAT 4, (NA,K, GLUC, HGB,HCT)     Status: Abnormal   Collection Time: 10/25/14 12:17 PM  Result Value Ref Range   Sodium 137 135 - 145 mmol/L   Potassium 4.5 3.5 - 5.1 mmol/L   Glucose, Bld 145 (H) 65 - 99 mg/dL   HCT 37.0 (L) 39.0 - 52.0 %   Hemoglobin 12.6 (L) 13.0 - 17.0 g/dL  Glucose, capillary     Status: Abnormal   Collection Time: 10/25/14  4:26 PM  Result Value Ref Range   Glucose-Capillary 129 (H) 65 - 99 mg/dL   Comment 1 Notify RN   Glucose, capillary     Status: Abnormal   Collection Time: 10/25/14 10:21 PM  Result Value Ref Range   Glucose-Capillary 160 (H) 65 - 99 mg/dL  Glucose, capillary     Status: Abnormal   Collection Time: 10/26/14  6:29 AM  Result Value Ref Range   Glucose-Capillary 127 (H) 65 - 99 mg/dL     ROS: Denies fever, chills, chest pain , sob, n/v/d only positives as in HPI. Did report some constipation relived with sorbital   Physical Exam: Filed Vitals:   10/26/14 0635  BP: 133/70  Pulse: 95  Temp: 98.4 F (36.9 C)  Resp: 16     General: alert Wm  NAd , pleasant  HEENT: [place], mmm, nonicteric Neck: no jvd , supple Heart: RRR, no mur, rub or gallop Lungs: rare R lower rhonchi cleared  after cough, otherwise CTA Abdomen: bs pos, soft ,NT, ND Extremities: Pedal edema L with surgical ace wrap L leg dry /clear/ no R pedal edema Skin: no overt rash Neuro: OX3 , no acute focal deficits for him  Dialysis Access: Pos bruit LUA AVF  Dialysis Orders: [facility]: [facility]  on MWF . EDW 85.5 HD Bath 2k/2ca  Time 4hr Heparin 4000. Access L UA AVF       Hectorol 3 mcg IV/HD Mircera 267mcg q 2wks last given 10/16/14      Venofer  100mg  q hd x10 to finish 11/11/14 then 50mg  q wkly hd  Other op lab hhgb 10.5 ca 9.2 phos 4.8 pth 295  Assessment/Plan 1. ESRD -  HD today then on mon to keep on schedule via AVF , use no hep on hd today 2. SP L Total Knee Revision - rx per Dr. Sharol Given  3. Hypertension/volume  - bp stable some post op edema , only uf 1 to 1.5 l  On 5mg  amlodipine  As op hs 4. Anemia  - hgb 12 hold esa / on iron  5. Metabolic bone disease -  On phoslo binder and vit d on hd 6. Nutrition - alb 2.9 /renal vit  Renal diet and breeze supplement  7. DM - Diet  8. Polymyalgia rheumatica ([place])  Ernest Haber, PA-C [place] [phone] 10/26/2014, 8:40 AM   Patient seen and examined, agree with above note with above modifications. Pt known to [place] s/p TKA revision.  Planning for HD today off schedule then Monday to get back on schedule- no issues- blood pressure and labs are good Corliss Parish, MD 10/26/2014

## 2014-10-26 NOTE — Clinical Social Work Note (Signed)
CSW consult acknowledged:  Clinical Education officer, museum received consult for SNF placement. PT currently recommending Home Health.  Clinical Social Worker will sign off for now as social work intervention is no longer needed. Please consult [place] again if new need arises.  Glendon Axe, MSW, LCSWA [phone] 10/26/2014 12:02 PM

## 2014-10-27 LAB — GLUCOSE, CAPILLARY
GLUCOSE-CAPILLARY: 99 mg/dL (ref 65–99)
GLUCOSE-CAPILLARY: 71 mg/dL (ref 65–99)
GLUCOSE-CAPILLARY: 54 mg/dL — AB (ref 65–99)
GLUCOSE-CAPILLARY: 65 mg/dL (ref 65–99)
Glucose-Capillary: 111 mg/dL — ABNORMAL HIGH (ref 65–99)
Glucose-Capillary: 166 mg/dL — ABNORMAL HIGH (ref 65–99)
Glucose-Capillary: 98 mg/dL (ref 65–99)

## 2014-10-27 MED ORDER — DARBEPOETIN ALFA 60 MCG/0.3ML IJ SOSY
60.0000 ug | PREFILLED_SYRINGE | INTRAMUSCULAR | Status: DC
  Administered 2014-10-28: 60 ug via INTRAVENOUS
  Filled 2014-10-27: qty 0.3

## 2014-10-27 NOTE — Progress Notes (Signed)
Pt had a temp.of 102.74F, c/o some weakness and generalized body pain. PRN Tylenol PO given for fever and dilaudid IV for pain were given. Per patient he just received a flu vaccine 2 days ago. Pt was instructed the use of IS to help lower down temp. Wet washcloth applied to forehead. Rechecked temp and it was 100F. No acute distress noted this time. Nursing will continue to monitor.

## 2014-10-27 NOTE — Progress Notes (Signed)
Subjective: 2 Days Post-Op Procedure(s) (LRB): LEFT TOTAL KNEE REVISION (Left) Patient reports pain as moderate.  Had dialysis yesterday.  Did have temp up to 102, but afebrile now.  Objective: Vital signs in last 24 hours: Temp:  [98 F (36.7 C)-102.2 F (39 C)] 100 F (37.8 C) (10/16 [id number]) Pulse Rate:  [96-107] 96 (10/16 0501) Resp:  [14-16] 16 (10/16 0501) BP: (99-139)/(51-70) 119/64 mmHg (10/16 0501) SpO2:  [94 %-99 %] 94 % (10/16 0501) Weight:  [88.5 kg (195 lb 1.7 oz)-89.9 kg (198 lb 3.1 oz)] 88.5 kg (195 lb 1.7 oz) (10/15 1635)  Intake/Output from previous day: 10/15 0701 - 10/16 0700 In: 840 [P.O.:840] Out: 1950 [Urine:450] Intake/Output this shift:     Recent Labs  10/25/14 1217 10/26/14 1401  HGB 12.6* 9.2*    Recent Labs  10/25/14 1217 10/26/14 1401  WBC  --  9.5  RBC  --  3.52*  HCT 37.0* 30.5*  PLT  --  216    Recent Labs  10/25/14 1217 10/26/14 1402  NA 137 138  K 4.5 5.0  CL  --  104  CO2  --  20*  BUN  --  51*  CREATININE  --  8.81*  GLUCOSE 145* 81  CALCIUM  --  8.6*   No results for input(s): LABPT, INR in the last 72 hours.  Intact pulses distally Dorsiflexion/Plantar flexion intact Incision: dressing C/D/I Compartment soft  Assessment/Plan: 2 Days Post-Op Procedure(s) (LRB): LEFT TOTAL KNEE REVISION (Left) Up with therapy  BLACKMAN,Emmit Y 10/27/2014, 7:17 AM

## 2014-10-27 NOTE — Progress Notes (Signed)
Pt's temp lowered down to 99.8. No complaints at this time. Pt resting comfortably. Will continue to monitor.

## 2014-10-27 NOTE — Progress Notes (Signed)
Utilization Review Completed.Matthew Kline T10/16/2016  

## 2014-10-27 NOTE — Progress Notes (Signed)
Physical Therapy Treatment Patient Details Name: Matthew Kline MRN: [id number] DOB: 1960/06/05 Today's Date: 10/27/2014    History of Present Illness Zaul Camera, 54 y.o. male, has a history of pain and functional disability in the left knee(s) due to failed previous arthroplasty and patient has failed non-surgical conservative treatments for greater than 12 weeks to include use of assistive devices and activity modification. The indications for the revision of the total knee arthroplasty are history of total knee infection.    PT Comments    Noting good progress with ambulation, activiyt tolerance; Mr. Marmol pushes himself, even though he was in a considerable amount of pain  Follow Up Recommendations  Home health PT;Supervision/Assistance - 24 hour     Equipment Recommendations  None recommended by PT    Recommendations for Other Services       Precautions / Restrictions Precautions Precautions: Fall Required Braces or Orthoses: Knee Immobilizer - Left Knee Immobilizer - Left: On when out of bed or walking Restrictions LLE Weight Bearing: Weight bearing as tolerated    Mobility  Bed Mobility Overal bed mobility: Needs Assistance Bed Mobility: Supine to Sit     Supine to sit: Supervision     General bed mobility comments: Overall managing well; used bedrails  Transfers Overall transfer level: Needs assistance Equipment used: Rolling walker (2 wheeled) Transfers: Sit to/from Stand Sit to Stand: Min guard         General transfer comment: initial attempt to stand with both hands on RW with RW tipping; cues for hand placement  Ambulation/Gait Ambulation/Gait assistance: Min guard Ambulation Distance (Feet): 100 Feet Assistive device: Rolling walker (2 wheeled) Gait Pattern/deviations: Step-through pattern Gait velocity: decreased   General Gait Details: cues for gait sequence; adjusted RW for more optimal fit   Stairs             Wheelchair Mobility    Modified Rankin (Stroke Patients Only)       Balance                                    Cognition Arousal/Alertness: Awake/alert Behavior During Therapy: WFL for tasks assessed/performed (very distrated with personal issues and hyperverbal) Overall Cognitive Status: Within Functional Limits for tasks assessed                      Exercises Total Joint Exercises Ankle Circles/Pumps: AROM;Both;10 reps Quad Sets: AROM;Left;10 reps Short Arc QuadSinclair Ship;Left;10 reps Heel Slides: AAROM;Left;10 reps Straight Leg Raises: AAROM;Left;10 reps Goniometric ROM: 10-50    General Comments        Pertinent Vitals/Pain Pain Assessment: 0-10 Pain Score: 9  Pain Location: L knee with therex Pain Descriptors / Indicators: Aching (noted tearing up with flexion therex) Pain Intervention(s): Limited activity within patient's tolerance;Monitored during session;Premedicated before session;Repositioned    Home Living                      Prior Function            PT Goals (current goals can now be found in the care plan section) Acute Rehab PT Goals Patient Stated Goal: home PT Goal Formulation: With patient Time For Goal Achievement: 11/02/14 Potential to Achieve Goals: Good Progress towards PT goals: Progressing toward goals    Frequency  7X/week    PT Plan Current plan remains appropriate    Co-evaluation  End of Session Equipment Utilized During Treatment: Gait belt Activity Tolerance: Patient tolerated treatment well Patient left: in chair;with call bell/phone within reach;with family/visitor present     Time: 1206-1243 PT Time Calculation (min) (ACUTE ONLY): 37 min  Charges:  $Gait Training: 8-22 mins $Therapeutic Exercise: 8-22 mins                    G Codes:      Quin Hoop 10/27/2014, 1:46 PM  Roney Marion, [place] Pager [phone] Office  [phone]

## 2014-10-27 NOTE — Progress Notes (Signed)
Hypoglycemic Event  CBG: 65  Treatment: graham crackers w/ peanut butter  Symptoms: no symptoms this time  Follow-up CBG: Time: 2300 CBG Result: 98  Possible Reasons for Event: inadequate food intake  Comments/MD notified: Nursing will continue to monitor.    Matthew Kline Lyn A

## 2014-10-27 NOTE — Progress Notes (Signed)
Subjective:  Fever overnight- had HD yest- removed 1500 tolerated well  Objective Vital signs in last 24 hours: Filed Vitals:   10/27/14 0337 10/27/14 0501 10/27/14 0632 10/27/14 0800  BP:  119/64    Pulse:  96    Temp: 100.2 F (37.9 C) 99.8 F (37.7 C) 100 F (37.8 C) 97.8 F (36.6 C)  TempSrc: Oral Oral Oral   Resp:  16    Height:      Weight:      SpO2:  94%     Weight change: 0.91 kg (2 lb 0.1 oz)  Intake/Output Summary (Last 24 hours) at 10/27/14 1054 Last data filed at 10/27/14 0814  Gross per 24 hour  Intake   1080 ml  Output   1950 ml  Net   -870 ml    Dialysis Orders: [facility]: [facility] MWF . EDW 85.5 HD Bath 2k/2ca Time 4hr Heparin 4000. Access L UA AVF  Hectorol 3 mcg IV/HD Mircera 228mcg q 2wks last given 10/16/14 Venofer 100mg  q hd x10 to finish 11/11/14 then 50mg  q wkly hd  Other op lab hhgb 10.5 ca 9.2 phos 4.8 pth 295  Assessment/Plan 1. ESRD - HD mon to keep on schedule via AVF , use no hep  2. SP L Total Knee Revision - done 10/14 rx per Dr. Sharol Given  3. Hypertension/volume - bp stable some post op edema , only uf 1 to 1.5 l On 5mg  amlodipine As op hs 4. Anemia - hgb 12 hold esa / on iron - down to 9's after surgery- add back ESA 5. Metabolic bone disease - On phoslo binder and vit d on hd 6. Nutrition - alb 2.9 /renal vit Renal diet and breeze supplement  7. DM - Diet  8. Polymyalgia rheumatica ([place]) 9. Fever- post op- feels well- follow fever curve today    Matthew Kline    Labs: Basic Metabolic Panel:  Recent Labs Lab 10/25/14 1217 10/26/14 1402  NA 137 138  K 4.5 5.0  CL  --  104  CO2  --  20*  GLUCOSE 145* 81  BUN  --  51*  CREATININE  --  8.81*  CALCIUM  --  8.6*  PHOS  --  9.0*   Liver Function Tests:  Recent Labs Lab 10/26/14 1402  ALBUMIN 2.3*   No results for input(s): LIPASE, AMYLASE in the last 168 hours. No results for input(s): AMMONIA in the last 168 hours. CBC:  Recent Labs Lab  10/25/14 1217 10/26/14 1401  WBC  --  9.5  HGB 12.6* 9.2*  HCT 37.0* 30.5*  MCV  --  86.6  PLT  --  216   Cardiac Enzymes: No results for input(s): CKTOTAL, CKMB, CKMBINDEX, TROPONINI in the last 168 hours. CBG:  Recent Labs Lab 10/26/14 1820 10/26/14 1844 10/26/14 2139 10/27/14 0245 10/27/14 0629  GLUCAP 70 68 124* 99 111*    Iron Studies: No results for input(s): IRON, TIBC, TRANSFERRIN, FERRITIN in the last 72 hours. Studies/Results: No results found. Medications: Infusions:    Scheduled Medications: . amLODipine  5 mg Oral Daily  . aspirin EC  325 mg Oral Q breakfast  . calcium acetate  667 mg Oral TID WC  . cholecalciferol  1,000 Units Oral Q M,W,F  . docusate sodium  100 mg Oral BID  . [START ON 10/28/2014] ferric gluconate (FERRLECIT/NULECIT) IV  125 mg Intravenous Q M,W,F-HD  . fluticasone  2 spray Each Nare Daily  . gabapentin  400  mg Oral QHS  . glipiZIDE  5 mg Oral QAC breakfast  . insulin aspart  0-9 Units Subcutaneous TID WC  . montelukast  10 mg Oral QHS    have reviewed scheduled and prn medications.  Physical Exam: General: NAD Heart: RRR Lungs: clear Abdomen: soft, non tender Extremities: minimal edema Dialysis Access: left AVF     10/27/2014,10:54 AM  LOS: 2 days

## 2014-10-27 NOTE — Progress Notes (Signed)
Hypoglycemic Event  CBG:54  Treatment: 1/2 cup ginger ale  Symptoms: drowsy  Follow-up CBG: Time: 2225 CBG Result: 65  Possible Reasons for Event: inadequate food intake   Comments/MD notified: nursing will continue to monitor.    Matthew Kline Lyn A

## 2014-10-28 ENCOUNTER — Encounter (HOSPITAL_COMMUNITY): Payer: Self-pay | Admitting: Orthopedic Surgery

## 2014-10-28 LAB — RENAL FUNCTION PANEL
ALBUMIN: 1.9 g/dL — AB (ref 3.5–5.0)
Anion gap: 13 (ref 5–15)
BUN: 50 mg/dL — AB (ref 6–20)
CHLORIDE: 101 mmol/L (ref 101–111)
CO2: 22 mmol/L (ref 22–32)
CREATININE: 7.4 mg/dL — AB (ref 0.61–1.24)
Calcium: 8.3 mg/dL — ABNORMAL LOW (ref 8.9–10.3)
GFR calc Af Amer: 9 mL/min — ABNORMAL LOW (ref >60)
GFR, EST NON AFRICAN AMERICAN: 7 mL/min — AB (ref >60)
Glucose, Bld: 106 mg/dL — ABNORMAL HIGH (ref 65–99)
PHOSPHORUS: 9.7 mg/dL — AB (ref 2.5–4.6)
Potassium: 5.1 mmol/L (ref 3.5–5.1)
Sodium: 136 mmol/L (ref 135–145)

## 2014-10-28 LAB — CBC WITH DIFFERENTIAL/PLATELET
BASOS ABS: 0 10*3/uL (ref 0.0–0.1)
BASOS PCT: 0 %
EOS PCT: 2 %
Eosinophils Absolute: 0.2 10*3/uL (ref 0.0–0.7)
HCT: 27.1 % — ABNORMAL LOW (ref 39.0–52.0)
Hemoglobin: 8.3 g/dL — ABNORMAL LOW (ref 13.0–17.0)
LYMPHS ABS: 1.1 10*3/uL (ref 0.7–4.0)
LYMPHS PCT: 14 %
MCH: 26.3 pg (ref 26.0–34.0)
MCHC: 30.6 g/dL (ref 30.0–36.0)
MCV: 86 fL (ref 78.0–100.0)
MONO ABS: 0.9 10*3/uL (ref 0.1–1.0)
Monocytes Relative: 11 %
NEUTROS PCT: 73 %
Neutro Abs: 5.6 10*3/uL (ref 1.7–7.7)
PLATELETS: 189 10*3/uL (ref 150–400)
RBC: 3.15 MIL/uL — ABNORMAL LOW (ref 4.22–5.81)
RDW: 19 % — AB (ref 11.5–15.5)
WBC: 7.8 10*3/uL (ref 4.0–10.5)

## 2014-10-28 LAB — GLUCOSE, CAPILLARY
GLUCOSE-CAPILLARY: 95 mg/dL (ref 65–99)
GLUCOSE-CAPILLARY: 114 mg/dL — AB (ref 65–99)
GLUCOSE-CAPILLARY: 211 mg/dL — AB (ref 65–99)
Glucose-Capillary: 127 mg/dL — ABNORMAL HIGH (ref 65–99)

## 2014-10-28 LAB — WOUND CULTURE
CULTURE: NO GROWTH
Gram Stain: NONE SEEN

## 2014-10-28 MED ORDER — DARBEPOETIN ALFA 60 MCG/0.3ML IJ SOSY
PREFILLED_SYRINGE | INTRAMUSCULAR | Status: AC
  Filled 2014-10-28: qty 0.3

## 2014-10-28 MED ORDER — ASPIRIN EC 325 MG PO TBEC: 325.0000 mg | DELAYED_RELEASE_TABLET | Freq: Every day | ORAL | Status: DC

## 2014-10-28 MED ORDER — OXYCODONE-ACETAMINOPHEN 5-325 MG PO TABS: 1.0000 | ORAL_TABLET | ORAL | Status: DC | PRN

## 2014-10-28 MED ORDER — OXYCODONE HCL 5 MG PO TABS
ORAL_TABLET | ORAL | Status: AC
  Filled 2014-10-28: qty 2

## 2014-10-28 NOTE — Discharge Summary (Signed)
Physician Discharge Summary  [name] MRN: [id number] DOB/AGE: 1960/06/14 54 y.o.  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 He underwent revision total knee arthroplasty postoperatively he progressed well and was discharged to home in stable condition.  Consults: nephrology  Significant Diagnostic Studies: labs: Routine labs  Treatments: dialysis: Hemodialysis and surgery: See operative note  Discharge Exam: Blood pressure 149/81, pulse 95, temperature 98 F (36.7 C), temperature source Oral, resp. rate 16, height 5\' 11"  (1.803 m), weight 88.5 kg (195 lb 1.7 oz), SpO2 93 %. Incision/Wound: dressing clean and dry  Disposition: 01-Home or Self Care  Discharge Instructions    Call MD / Call 911    Complete by:  As directed   If you experience chest pain or shortness of breath, CALL 911 and be transported to the hospital emergency room.  If you develope a fever above 101 F, pus (white drainage) or increased drainage or redness at the wound, or calf pain, call your surgeon's office.     Constipation Prevention    Complete by:  As directed   Drink plenty of fluids.  Prune juice may be helpful.  You may use a stool softener, such as Colace (over the counter) 100 mg twice a day.  Use MiraLax (over the counter) for constipation as needed.     Diet - low sodium heart healthy    Complete by:  As directed      Elevate operative extremity    Complete by:  As directed      Increase activity slowly as tolerated    Complete by:  As directed      Weight bearing as tolerated    Complete by:  As directed   Laterality:  left  Extremity:  Lower            Medication List    TAKE these medications        albuterol  108 (90 BASE) MCG/ACT inhaler  Commonly known as:  PROVENTIL HFA;VENTOLIN HFA  Inhale 2 puffs into the lungs every 6 (six) hours as needed for wheezing or shortness of breath.     amLODipine 5 MG tablet  Commonly known as:  NORVASC  Take 5 mg by mouth daily.     aspirin EC 325 MG tablet  Take 1 tablet (325 mg total) by mouth daily.     calcium acetate 667 MG capsule  Commonly known as:  PHOSLO  Take 667 mg by mouth 3 (three) times daily with meals.     cefUROXime 500 MG tablet  Commonly known as:  CEFTIN  Take 1 tab po after dialysis MWF x 10 doses     fexofenadine 180 MG tablet  Commonly known as:  ALLEGRA  Take 1 tablet (180 mg total) by mouth daily.     fluticasone 50 MCG/ACT nasal spray  Commonly known as:  FLONASE  Place 2 sprays into both nostrils daily.     gabapentin 100 MG capsule  Commonly known as:  NEURONTIN  Take 400 mg by mouth at bedtime.     glipiZIDE 5 MG tablet  Commonly known as:  GLUCOTROL  Take 1 tablet (5 mg total) by mouth daily before breakfast.     ibuprofen 200 MG tablet  Commonly known as:  ADVIL,MOTRIN  Take  200 mg by mouth daily as needed (pain).     montelukast 10 MG tablet  Commonly known as:  SINGULAIR  Take 1 tablet (10 mg total) by mouth at bedtime.     oxyCODONE-acetaminophen 5-325 MG tablet  Commonly known as:  ROXICET  Take 1 tablet by mouth every 4 (four) hours as needed for severe pain.     sildenafil 25 MG tablet  Commonly known as:  VIAGRA  Take 2 tablets (50 mg total) by mouth daily as needed for erectile dysfunction.     vancomycin 1 GM/200ML Soln  Commonly known as:  VANCOCIN  Inject 200 mLs (1,000 mg total) into the vein every Monday, Wednesday, and Friday with hemodialysis.     VITAMIN D PO  Take 1 tablet by mouth every Monday, Wednesday, and Friday. Take at dialysis     zolpidem 10 MG tablet  Commonly known as:  AMBIEN  Take 1 tablet (10 mg total) by mouth at bedtime as needed for sleep.            Follow-up Information    Follow up with Stevey Stapleton V, MD In 2 weeks.   Specialty:  Orthopedic Surgery   Contact information:   [place] [address] [phone]       Signed: [name] 10/28/2014, 6:35 AM

## 2014-10-28 NOTE — Progress Notes (Signed)
Occupational Therapy Treatment Patient Details Name: Matthew Kline MRN: [id number] DOB: 1960-03-24 Today's Date: 10/28/2014    History of present illness Matthew Kline, 54 y.o. male, has a history of pain and functional disability in the left knee(s) due to failed previous arthroplasty and patient has failed non-surgical conservative treatments for greater than 12 weeks to include use of assistive devices and activity modification. The indications for the revision of the total knee arthroplasty are history of total knee infection.   OT comments  Pt making good progress toward OT goals. Pt very slow moving today requiring max encouragement to continue participating in therapy. Educated pt on tub transfer with 3 in 1, pt reported that he did not want to use 3 in 1. Attempted tub transfer without 3 in 1, pt was unsuccessful in completing. Practiced tub transfer with 3 in 1, pt min assist for guiding LLE into tub. Pt educated on safety benefits of using 3 in 1 for tub transfer; pt acknowledged. At this time, recommending HHOT following d/c in order to maximize pts safety with ADLs and functional transfers in the home environment. Continue to follow pt acutely.    Follow Up Recommendations  Home health OT;Supervision - Intermittent    Equipment Recommendations  3 in 1 bedside comode    Recommendations for Other Services      Precautions / Restrictions Precautions Precautions: Fall Required Braces or Orthoses: Knee Immobilizer - Left Knee Immobilizer - Left: On when out of bed or walking Restrictions Weight Bearing Restrictions: Yes LLE Weight Bearing: Weight bearing as tolerated       Mobility Bed Mobility Overal bed mobility: Needs Assistance Bed Mobility: Supine to Sit     Supine to sit: Supervision     General bed mobility comments: Moving slow, encouragement required throughout to keep participating in therapy  Transfers Overall transfer level: Needs  assistance Equipment used: Rolling walker (2 wheeled) Transfers: Sit to/from Stand Sit to Stand: Supervision         General transfer comment: Sit <> stand from EOB x 1, chair x 1, BSC x 1    Balance                                   ADL Overall ADL's : Needs assistance/impaired                                 Tub/ Shower Transfer: Tub transfer;Minimal assistance;Ambulation;3 in 1;Rolling walker Tub/Shower Transfer Details (indicate cue type and reason): Min assist for guiding LLE into tub. Functional mobility during ADLs: Supervision/safety;Rolling walker General ADL Comments: Pt easily distractable and max encouragement needed to participate in therapy. Pt initially refused use of 3 in 1 for tub transfer. Attempted tub transfer without 3 in 1 and it was unsuccessful. Educated pt on benefit of 3 in 1 for safety and ease of getting in an out of shower. Pt agreed to trial 3 in 1 tub transfer. Pt able to complete 3 in 1 tub transfer with min A to guide LLE. Pt unsure of if he will use 3 in 1 at home but it was highly encouraged by OT.       Vision                     Perception     Praxis  Cognition   Behavior During Therapy: WFL for tasks assessed/performed (easily distractable, hyperverbal) Overall Cognitive Status: Within Functional Limits for tasks assessed                       Extremity/Trunk Assessment               Exercises     Shoulder Instructions       General Comments      Pertinent Vitals/ Pain       Pain Assessment: 0-10 Pain Score: 9  Pain Location: L knee Pain Descriptors / Indicators: Aching Pain Intervention(s): Limited activity within patient's tolerance;Monitored during session;Repositioned  Home Living                                          Prior Functioning/Environment              Frequency Min 2X/week     Progress Toward Goals  OT Goals(current goals  can now be found in the care plan section)  Progress towards OT goals: Progressing toward goals  Acute Rehab OT Goals Patient Stated Goal: go home tomorrow  Plan Discharge plan needs to be updated    Co-evaluation    PT/OT/SLP Co-Evaluation/Treatment: Yes Reason for Co-Treatment: Complexity of the patient's impairments (multi-system involvement)   OT goals addressed during session: ADL's and self-care      End of Session Equipment Utilized During Treatment: Gait belt;Rolling walker;Other (comment) (3 in 1 for tub transfer )   Activity Tolerance Patient tolerated treatment well   Patient Left with call bell/phone within reach (sitting at sink in bathroom getting washed up)   Nurse Communication Other (comment) (pt in bathroom getting washed up)        Time: [id number] OT Time Calculation (min): 69 min  Charges: OT General Charges $OT Visit: 1 Procedure OT Treatments $Self Care/Home Management : 53-67 mins  Binnie Kand M.S., OTR/L Pager: [phone]  10/28/2014, 3:09 PM

## 2014-10-28 NOTE — Progress Notes (Signed)
[name], male   DOB: 08-12-1960, 54 y.o.   MRN: [id number] Plan for discharge to home today after dialysis.

## 2014-10-28 NOTE — Progress Notes (Signed)
Subjective:  Alert, no complaints  Objective Vital signs in last 24 hours: Filed Vitals:   10/28/14 0625 10/28/14 0804 10/28/14 0822 10/28/14 0825  BP: 149/81 143/80 139/74 139/78  Pulse: 95 105 102 103  Temp: 98 F (36.7 C) 99.2 F (37.3 C)    TempSrc: Oral Oral    Resp: 16 18    Height:      Weight:  89.9 kg (198 lb 3.1 oz)    SpO2: 93% 98%     Weight change:   Intake/Output Summary (Last 24 hours) at 10/28/14 0942 Last data filed at 10/28/14 0804  Gross per 24 hour  Intake    720 ml  Output      0 ml  Net    720 ml    Dialysis: [facility]   4h   2/2 bath  85.5kg  Hep 4000  LUA AVF Hect 3 Mircera 225 q 2wks, last 10.5 Venofer 100 x 10 finish 10/31 OP lab > hgb 10.5 ca 9.2 phos 4.8 pth 295  Assessment: 1. ESRD - HD mon to keep on schedule via AVF , use no hep  2. SP L Total Knee Revision - done 10/14 rx per Dr. Sharol Given  3. Hypertension/volume - up 3kg, stable 4. Anemia - hgb 12 hold esa / on iron - down to 9's after surgery- add back ESA 5. Metabolic bone disease - On phoslo binder and vit d on hd 6. Nutrition - alb 2.9 /renal vit Renal diet and breeze supplement  7. DM - Diet  8. Polymyalgia rheumatica ([place]) 9. Dispo - stable from renal standpoint  Plan - HD today  Kelly Splinter MD [place] pager [phone]    cell [phone] 10/28/2014, 9:54 AM          Labs: Basic Metabolic Panel:  Recent Labs Lab 10/25/14 1217 10/26/14 1402 10/28/14 0900  NA 137 138 136  K 4.5 5.0 5.1  CL  --  104 101  CO2  --  20* 22  GLUCOSE 145* 81 106*  BUN  --  51* 50*  CREATININE  --  8.81* 7.40*  CALCIUM  --  8.6* 8.3*  PHOS  --  9.0* 9.7*   Liver Function Tests:  Recent Labs Lab 10/26/14 1402 10/28/14 0900  ALBUMIN 2.3* 1.9*   No results for input(s): LIPASE, AMYLASE in the last 168 hours. No results for input(s): AMMONIA in the last 168 hours. CBC:  Recent Labs Lab 10/25/14 1217 10/26/14 1401 10/28/14 0900  WBC  --  9.5 7.8   NEUTROABS  --   --  5.6  HGB 12.6* 9.2* 8.3*  HCT 37.0* 30.5* 27.1*  MCV  --  86.6 86.0  PLT  --  216 189   Cardiac Enzymes: No results for input(s): CKTOTAL, CKMB, CKMBINDEX, TROPONINI in the last 168 hours. CBG:  Recent Labs Lab 10/27/14 1636 10/27/14 2123 10/27/14 2224 10/27/14 2309 10/28/14 0629  GLUCAP 71 54* 65 98 95    Iron Studies: No results for input(s): IRON, TIBC, TRANSFERRIN, FERRITIN in the last 72 hours. Studies/Results: No results found. Medications: Infusions:    Scheduled Medications: . amLODipine  5 mg Oral Daily  . aspirin EC  325 mg Oral Q breakfast  . calcium acetate  667 mg Oral TID WC  . cholecalciferol  1,000 Units Oral Q M,W,F  . darbepoetin (ARANESP) injection - DIALYSIS  60 mcg Intravenous Q Mon-HD  . docusate sodium  100 mg Oral BID  . ferric gluconate (  FERRLECIT/NULECIT) IV  125 mg Intravenous Q M,W,F-HD  . fluticasone  2 spray Each Nare Daily  . gabapentin  400 mg Oral QHS  . glipiZIDE  5 mg Oral QAC breakfast  . insulin aspart  0-9 Units Subcutaneous TID WC  . montelukast  10 mg Oral QHS    have reviewed scheduled and prn medications.  Physical Exam: General: NAD Heart: RRR Lungs: clear Abdomen: soft, non tender Extremities: minimal edema Dialysis Access: left AVF     10/28/2014,9:42 AM  LOS: 3 days

## 2014-10-28 NOTE — Discharge Instructions (Signed)

## 2014-10-28 NOTE — Care Management Important Message (Signed)
Important Message  Patient Details  Name: Matthew Kline MRN: [id number] Date of Birth: September 04, 1960   Medicare Important Message Given:  Yes-second notification given    Delorse Lek 10/28/2014, 1:30 PM

## 2014-10-28 NOTE — Progress Notes (Signed)
OT Cancellation Note  Patient Details Name: Matthew Kline MRN: [id number] DOB: 08/07/1960   Cancelled Treatment:    Reason Eval/Treat Not Completed: Patient at procedure or test/ unavailable (in HD). Will check back for OT as time allows and appropriate.   Binnie Kand M.S., OTR/L Pager: [phone]  10/28/2014, 8:22 AM

## 2014-10-28 NOTE — Care Management Note (Signed)
Case Management Note  Patient Details  Name: Matthew Kline MRN: [id number] Date of Birth: 1960-09-01  Subjective/Objective:  54 yr old male admitted with failed left total knee. Patient underwent a left total knee revision.                   Action/Plan: Patient has been followed by [facility] for [facility], will do so now. He has rolling walker and 3in1 at home.   Expected Discharge Date:    10/28/14              Expected Discharge Plan:   Home with Home Health  In-House Referral:  NA  Discharge planning Services  CM Consult  Post Acute Care Choice:  Home Health Choice offered to:  Patient  DME Arranged:  N/A DME Agency:  NA  HH Arranged:  PT [place] Agency:  [facility]  Status of Service:  Completed, signed off  Medicare Important Message Given:    Date Medicare IM Given:    Medicare IM give by:    Date Additional Medicare IM Given:    Additional Medicare Important Message give by:     If discussed at [place] of Stay Meetings, dates discussed:    Additional Comments:  Ninfa Meeker, RN 10/28/2014, 10:57 AM

## 2014-10-28 NOTE — Progress Notes (Signed)
PT Cancellation Note  Patient Details Name: Matthew Kline MRN: [id number] DOB: 12/02/1960   Cancelled Treatment:    Reason Eval/Treat Not Completed: Patient at procedure or test/unavailable   Currently in HD;  Will plan to follow up for more gait training and possible stair training after HD;   Thanks,  Roney Marion, PT  Acute Rehabilitation Services Pager [phone] Office [phone]    Roney Marion [facility] 10/28/2014, 8:17 AM

## 2014-10-28 NOTE — Progress Notes (Signed)
Physical Therapy Treatment Patient Details Name: Tykeem Zaya MRN: [id number] DOB: 1960-04-26 Today's Date: 10/28/2014    History of Present Illness Ares Castel, 54 y.o. male, has a history of pain and functional disability in the left knee(s) due to failed previous arthroplasty and patient has failed non-surgical conservative treatments for greater than 12 weeks to include use of assistive devices and activity modification. The indications for the revision of the total knee arthroplasty are history of total knee infection.    PT Comments    Continuing progress with mobility; showing overall good activity tolerance, even after HD; Session focused on more complex transfers and gait training (co-treat with OT; see OT note re: tub transfer), stair training as well; unconventional way of managing steps to enter his home (see stair training section below); On track for dc home tomorrow from PT standpoint  Follow Up Recommendations  Home health PT;Supervision - Intermittent (HHOT as well)     Equipment Recommendations  3in1 (PT)    Recommendations for Other Services       Precautions / Restrictions Precautions Precautions: Fall Required Braces or Orthoses: Knee Immobilizer - Left Knee Immobilizer - Left: On when out of bed or walking Restrictions Weight Bearing Restrictions: Yes LLE Weight Bearing: Weight bearing as tolerated    Mobility  Bed Mobility Overal bed mobility: Needs Assistance Bed Mobility: Supine to Sit     Supine to sit: Supervision     General bed mobility comments: Moving slow, encouragement required throughout to keep participating in therapy  Transfers Overall transfer level: Needs assistance Equipment used: Rolling walker (2 wheeled) Transfers: Sit to/from Stand Sit to Stand: Supervision         General transfer comment: noted slightly impulsive; needing continued cues for safe hand placement  Ambulation/Gait Ambulation/Gait assistance:  Min guard Ambulation Distance (Feet): 40 Feet Assistive device: Rolling walker (2 wheeled) Gait Pattern/deviations: Step-through pattern (Emerging step-through)     General Gait Details: Cues to self-monitor for activity tolerance; Cues to activate L quad for stance stability   Stairs Stairs: Yes Stairs assistance: Min assist Stair Management: One rail Left;Step to pattern;Forwards;Backwards;With walker Number of Stairs: 2 General stair comments: Much discussion on safely managing as independently as possible (including RW management); At home for a while he has been placing his RW at the top of the second step to enter his home and holding on to a post on the Left; He demostrated this relatively safely ascending 2 steps; He descended the 2 steps holding the rail on the left (simulating the post) -- he stepped down backwards, leading with the LLE; unconventional technique, but no loss of balance noted  Wheelchair Mobility    Modified Rankin (Stroke Patients Only)       Balance                                    Cognition Arousal/Alertness: Awake/alert Behavior During Therapy: WFL for tasks assessed/performed (easily distractable, hyperverbal) Overall Cognitive Status: Within Functional Limits for tasks assessed                      Exercises Total Joint Exercises Ankle Circles/Pumps:  (Pt declined therex today; REALLY wanting to wash up)    General Comments        Pertinent Vitals/Pain Pain Assessment: 0-10 Pain Score: 9  Pain Location: L knee Pain Descriptors / Indicators: Aching Pain Intervention(s): Monitored during  session;Other (comment) (Requested premedication for pain)    Home Living                      Prior Function            PT Goals (current goals can now be found in the care plan section) Acute Rehab PT Goals Patient Stated Goal: go home tomorrow PT Goal Formulation: With patient Time For Goal Achievement:  11/02/14 Potential to Achieve Goals: Good Progress towards PT goals: Progressing toward goals    Frequency  7X/week    PT Plan Current plan remains appropriate    Co-evaluation PT/OT/SLP Co-Evaluation/Treatment: Yes Reason for Co-Treatment: Complexity of the patient's impairments (multi-system involvement);For patient/therapist safety PT goals addressed during session: Mobility/safety with mobility;Balance;Proper use of DME OT goals addressed during session: ADL's and self-care     End of Session Equipment Utilized During Treatment: Gait belt;Left knee immobilizer Activity Tolerance: Patient tolerated treatment well Patient left: in chair;with call bell/phone within reach;Other (comment) (set up to wash up by the sink; pull string within reach)     Time: 1430-1451 PT Time Calculation (min) (ACUTE ONLY): 21 min  Charges:  $Gait Training: 8-22 mins                    G Codes:      Roney Marion Hamff 10/28/2014, 4:07 PM  Roney Marion, [place] Pager [phone] Office [phone]

## 2014-10-29 ENCOUNTER — Encounter (HOSPITAL_COMMUNITY): Payer: Self-pay | Admitting: General Practice

## 2014-10-29 LAB — GLUCOSE, CAPILLARY: GLUCOSE-CAPILLARY: 124 mg/dL — AB (ref 65–99)

## 2014-10-29 NOTE — Progress Notes (Signed)
[name], male   DOB: 10-30-1960, 54 y.o.   MRN: [id number] Patient's discharge yesterday was delayed due to dialysis yesterday. We will have his surgical dressing change prior to discharge. No changes in the discharge planning or discharge condition. Plan for discharge to home today.

## 2014-10-29 NOTE — Progress Notes (Signed)
  [facility] Progress Note   Subjective: stable , going home today  Filed Vitals:   10/28/14 1700 10/28/14 2132 10/29/14 0633 10/29/14 0800  BP: 141/70 131/73 130/69 145/88  Pulse:  97 99 104  Temp:  98.3 F (36.8 C) 98.3 F (36.8 C)   TempSrc:  Oral Oral   Resp:  18 18   Height:      Weight:      SpO2:  97% 93%    Exam: Alert, no distress No jvd Chest clear bilat RRR abd soft ntnd no ascites L knee in bandages, 1+ pretib edema on L Left arm AVF+bruit Neuro is nf , ox 3  NWMWF 4h 2/2 bath 85.5kg Hep 4000 LUA AVF Hect 3 Mircera 225 q 2wks, last 10.5 Venofer 100 x 10 finish 10/31 OP lab > hgb 10.5 ca 9.2 phos 4.8 pth 295      Assessment: 1 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) - 10/14 Dr Sharol Given 2 ESRD HD mwf 3 HTN cont amlod 4 Vol is at dry  5 Anemia cont'd esa  6 MBD cont meds 7 DM on oral rx + SSI  Plan - for dc home today   Kelly Splinter MD [facility] pager [phone]    cell [phone] 10/29/2014, 9:49 AM    Recent Labs Lab 10/25/14 1217 10/26/14 1402 10/28/14 0900  NA 137 138 136  K 4.5 5.0 5.1  CL  --  104 101  CO2  --  20* 22  GLUCOSE 145* 81 106*  BUN  --  51* 50*  CREATININE  --  8.81* 7.40*  CALCIUM  --  8.6* 8.3*  PHOS  --  9.0* 9.7*    Recent Labs Lab 10/26/14 1402 10/28/14 0900  ALBUMIN 2.3* 1.9*    Recent Labs Lab 10/25/14 1217 10/26/14 1401 10/28/14 0900  WBC  --  9.5 7.8  NEUTROABS  --   --  5.6  HGB 12.6* 9.2* 8.3*  HCT 37.0* 30.5* 27.1*  MCV  --  86.6 86.0  PLT  --  216 189   . amLODipine  5 mg Oral Daily  . aspirin EC  325 mg Oral Q breakfast  . calcium acetate  667 mg Oral TID WC  . cholecalciferol  1,000 Units Oral Q M,W,F  . darbepoetin (ARANESP) injection - DIALYSIS  60 mcg Intravenous Q Mon-HD  . docusate sodium  100 mg Oral BID  . ferric gluconate (FERRLECIT/NULECIT) IV  125 mg Intravenous Q M,W,F-HD  . fluticasone  2 spray Each Nare Daily  . gabapentin  400  mg Oral QHS  . glipiZIDE  5 mg Oral QAC breakfast  . insulin aspart  0-9 Units Subcutaneous TID WC  . montelukast  10 mg Oral QHS     acetaminophen **OR** acetaminophen, albuterol, bisacodyl, HYDROmorphone (DILAUDID) injection, magnesium citrate, menthol-cetylpyridinium **OR** phenol, metoCLOPramide **OR** metoCLOPramide (REGLAN) injection, ondansetron **OR** ondansetron (ZOFRAN) IV, oxyCODONE, polyethylene glycol, zolpidem

## 2014-10-29 NOTE — Progress Notes (Signed)
NURSING PROGRESS NOTE  Matthew Kline [id number] Discharge Data: 10/29/2014 8:38 AM Attending Provider: [name], MD [id number], Brayton Layman, DO     [name] to be D/C'd Home per MD order.  Discussed with the patient the After Visit Summary and all questions fully answered. All IV's discontinued with no bleeding noted. All belongings returned to patient for patient to take home.   Last Vital Signs:  Blood pressure 145/88, pulse 104, temperature 98.3 F (36.8 C), temperature source Oral, resp. rate 18, height 5\' 11"  (1.803 m), weight 86.1 kg (189 lb 13.1 oz), SpO2 93 %.  Discharge Medication List   Medication List    TAKE these medications        albuterol 108 (90 BASE) MCG/ACT inhaler  Commonly known as:  PROVENTIL HFA;VENTOLIN HFA  Inhale 2 puffs into the lungs every 6 (six) hours as needed for wheezing or shortness of breath.     amLODipine 5 MG tablet  Commonly known as:  NORVASC  Take 5 mg by mouth daily.     aspirin EC 325 MG tablet  Take 1 tablet (325 mg total) by mouth daily.     calcium acetate 667 MG capsule  Commonly known as:  PHOSLO  Take 667 mg by mouth 3 (three) times daily with meals.     cefUROXime 500 MG tablet  Commonly known as:  CEFTIN  Take 1 tab po after dialysis MWF x 10 doses     fexofenadine 180 MG tablet  Commonly known as:  ALLEGRA  Take 1 tablet (180 mg total) by mouth daily.     fluticasone 50 MCG/ACT nasal spray  Commonly known as:  FLONASE  Place 2 sprays into both nostrils daily.     gabapentin 100 MG capsule  Commonly known as:  NEURONTIN  Take 400 mg by mouth at bedtime.     glipiZIDE 5 MG tablet  Commonly known as:  GLUCOTROL  Take 1 tablet (5 mg total) by mouth daily before breakfast.     ibuprofen 200 MG tablet  Commonly known as:  ADVIL,MOTRIN  Take 200 mg by mouth daily as needed (pain).     montelukast 10 MG tablet  Commonly known as:  SINGULAIR  Take 1 tablet (10 mg total) by mouth at bedtime.     oxyCODONE-acetaminophen 5-325 MG tablet  Commonly known as:  ROXICET  Take 1 tablet by mouth every 4 (four) hours as needed for severe pain.     sildenafil 25 MG tablet  Commonly known as:  VIAGRA  Take 2 tablets (50 mg total) by mouth daily as needed for erectile dysfunction.     vancomycin 1 GM/200ML Soln  Commonly known as:  VANCOCIN  Inject 200 mLs (1,000 mg total) into the vein every Monday, Wednesday, and Friday with hemodialysis.     VITAMIN D PO  Take 1 tablet by mouth every Monday, Wednesday, and Friday. Take at dialysis     zolpidem 10 MG tablet  Commonly known as:  AMBIEN  Take 1 tablet (10 mg total) by mouth at bedtime as needed for sleep.         Charolette Child, RN

## 2014-10-29 NOTE — Progress Notes (Signed)
Physical Therapy Treatment Patient Details Name: Matthew Kline MRN: [id number] DOB: 06/14/1960 Today's Date: 10/29/2014    History of Present Illness Matthew Kline, 54 y.o. male, has a history of pain and functional disability in the left knee(s) due to failed previous arthroplasty and patient has failed non-surgical conservative treatments for greater than 12 weeks to include use of assistive devices and activity modification. The indications for the revision of the total knee arthroplasty are history of total knee infection.    PT Comments    Continuing progress with notable improvements in therex, and knee flexion ROM; Quad lag persists, approx 10 deg; Still, mobilizing well, despite some impulsivity; OK for dc home from PT standpoint   [name] is concerned and has questions about L ankle pain, feeling like it is sprained; somewhat limited ROM (has had previous surgery), able to weight bear on L ankle without issue  Follow Up Recommendations  Home health PT;Supervision - Intermittent (HHOT as well)     Equipment Recommendations  3in1 (PT)    Recommendations for Other Services       Precautions / Restrictions Precautions Precautions: Fall Precaution Comments: fall risk greatly reduced withuse of RW Required Braces or Orthoses: Knee Immobilizer - Left Knee Immobilizer - Left: On when out of bed or walking Restrictions Weight Bearing Restrictions: Yes LLE Weight Bearing: Weight bearing as tolerated    Mobility  Bed Mobility Overal bed mobility: Needs Assistance Bed Mobility: Supine to Sit     Supine to sit: Supervision     General bed mobility comments: no physical assist needed  Transfers Overall transfer level: Needs assistance Equipment used: Rolling walker (2 wheeled) Transfers: Sit to/from Stand Sit to Stand: Supervision         General transfer comment: continuned impulsivity; stood without RW close; still, no gross loss of balance with  transition  Ambulation/Gait Ambulation/Gait assistance: Supervision Ambulation Distance (Feet): 200 Feet Assistive device: Rolling walker (2 wheeled) Gait Pattern/deviations: Step-through pattern Gait velocity: decreased   General Gait Details: Cues to self-monitor for activity tolerance; Cues to activate L quad for stance stability; worked on limiting trunk flexion as well   Financial trader Rankin (Stroke Patients Only)       Balance                                    Cognition Arousal/Alertness: Awake/alert Behavior During Therapy: WFL for tasks assessed/performed Overall Cognitive Status: Within Functional Limits for tasks assessed                      Exercises Total Joint Exercises Quad Sets: AROM;Left;10 reps Short Arc QuadSinclair Ship;Left;10 reps Heel Slides: AAROM;Left;10 reps Straight Leg Raises: AAROM;Left;10 reps Goniometric ROM: 10-70    General Comments        Pertinent Vitals/Pain Pain Assessment: 0-10 Pain Score: 7  Pain Location: L knee Pain Descriptors / Indicators: Aching Pain Intervention(s): Monitored during session;Repositioned    Home Living Family/patient expects to be discharged to:: Private residence Living Arrangements: Spouse/significant other                  Prior Function            PT Goals (current goals can now be found in the care plan section) Acute Rehab PT Goals Patient Stated Goal: Home  today PT Goal Formulation: With patient Time For Goal Achievement: 11/02/14 Potential to Achieve Goals: Good Progress towards PT goals: Progressing toward goals    Frequency  7X/week    PT Plan Current plan remains appropriate    Co-evaluation             End of Session Equipment Utilized During Treatment: Left knee immobilizer Activity Tolerance: Patient tolerated treatment well Patient left: in chair;with call bell/phone within reach;with  family/visitor present     Time: 1027-1106 PT Time Calculation (min) (ACUTE ONLY): 39 min  Charges:  $Gait Training: 23-37 mins $Therapeutic Exercise: 8-22 mins                    G Codes:      Roney Marion Hamff 10/29/2014, 11:50 AM  Roney Marion, [place] Pager [phone] Office [phone]

## 2014-10-30 ENCOUNTER — Encounter (HOSPITAL_COMMUNITY): Payer: Self-pay | Admitting: Orthopedic Surgery

## 2014-10-30 LAB — ANAEROBIC CULTURE: GRAM STAIN: NONE SEEN

## 2014-11-06 ENCOUNTER — Other Ambulatory Visit (HOSPITAL_COMMUNITY): Payer: Self-pay | Admitting: Orthopedic Surgery

## 2014-11-06 DIAGNOSIS — R609 Edema, unspecified: Secondary | ICD-10-CM

## 2014-11-07 ENCOUNTER — Ambulatory Visit (HOSPITAL_COMMUNITY)
Admission: RE | Admit: 2014-11-07 | Discharge: 2014-11-07 | Disposition: A | Discharge status: Home / Self Care | Payer: Commercial Managed Care - HMO | Source: Ambulatory Visit | Attending: Internal Medicine | Admitting: Internal Medicine

## 2014-11-07 DIAGNOSIS — M79605 Pain in left leg: Secondary | ICD-10-CM | POA: Diagnosis not present

## 2014-11-07 DIAGNOSIS — R609 Edema, unspecified: Secondary | ICD-10-CM

## 2014-11-07 DIAGNOSIS — M7989 Other specified soft tissue disorders: Secondary | ICD-10-CM | POA: Diagnosis not present

## 2014-11-07 DIAGNOSIS — Z96659 Presence of unspecified artificial knee joint: Secondary | ICD-10-CM | POA: Insufficient documentation

## 2014-11-07 NOTE — Progress Notes (Signed)
VASCULAR LAB PRELIMINARY  PRELIMINARY  PRELIMINARY  PRELIMINARY  Left lower extremity venous duplex completed.    Preliminary report:  There is no DVT or SVT noted in the left lower extremity.   Caliann Leckrone, RVT 11/07/2014, 3:41 PM

## 2014-11-07 NOTE — Patient Outreach (Signed)
[place] [facility]) Care Management  11/07/2014  Nii Ellefsen 10-21-1960 [id number]   Referral from [facility] tier 4 list, assigned [name], RN for patient outreach.  Lynnex Fulp L. Addison Lank [place] Care Management Assistant [phone]

## 2014-11-08 ENCOUNTER — Other Ambulatory Visit: Payer: Self-pay

## 2014-11-08 NOTE — Patient Outreach (Signed)
[place] [facility]) Care Management  11/08/2014  Cindy Austgen 15-Apr-1960 [id number]   Telephone Screen  Referral Date: 11/07/14 Referral Source: [facility] tier 4 List Referral Reason: DM with 5 ED visits and 3 hospital admits Primary MD: Dr. Gildardo Cranker  Outreach attempt # 1 to patient. Patient not reached.  Plan: RNCM will attempt outreach call to patient within a week.  [name], RN,BSN,CCM [facility] Care Management Telephonic Care Mgmt. Coordinator Direct Phone: [phone] Fax: [phone]

## 2014-11-12 ENCOUNTER — Other Ambulatory Visit: Payer: Self-pay

## 2014-11-12 NOTE — Patient Outreach (Signed)
[name] [facility]) Care Management  11/12/2014  Matthew Kline [birth year] [id number]   Telephone Screen  Referral Date: 11/07/14 Referral Source: [facility] tier 4 List Referral Reason: DM with 5 ED visits and 3 hospital admits Primary MD: Dr. Gildardo Cranker  Outreach attempt # 2 to patient. No answer and unable to leave message.  Plan: RNCM will attempt outreach call to patient within a week.  [name], RN,BSN,CCM [place] Management Telephonic Care Management Coordinator Direct Phone: [phone] Toll Free: [phone] Fax: [phone]

## 2014-11-18 ENCOUNTER — Other Ambulatory Visit: Payer: Self-pay

## 2014-11-18 ENCOUNTER — Other Ambulatory Visit: Payer: Self-pay | Admitting: Internal Medicine

## 2014-11-18 NOTE — Patient Outreach (Signed)
[facility]) Care Management  11/18/2014  Matthew Kline Mar 28, 1960 [id number]   Referral Date: 11/07/14 Referral Source: [facility] tier 4 List Referral Reason: DM with 5 ED visits and 3 hospital admits Primary MD: Dr. Gildardo Cranker  Outreach attempt # 3 to patient. Patient not reached and unable to leave voicemail message.  Plan: RN CM will send unable to reach letter to patient. RN CM will attempt to contact patient again within 10 days. RN CM will close case out if not successful in reaching patient.  [name], RN,BSN,CCM [place] Management Telephonic Care Management Coordinator Direct Phone: [phone] Toll Free: [phone] Fax: [phone]

## 2014-11-27 ENCOUNTER — Ambulatory Visit: Payer: Self-pay

## 2014-11-27 ENCOUNTER — Other Ambulatory Visit: Payer: Self-pay

## 2014-11-27 NOTE — Patient Outreach (Signed)
[place] [facility]) Care Management  11/27/2014  Matthew Kline 1960/09/25 [id number]   Referral Date: 11/07/14 Referral Source: [facility] tier 4 List Referral Reason: DM with 5 ED visits and 3 hospital admits Primary MD: Dr. Gildardo Cranker   Outreach attempt to patient. Patient reached. Patient reports that he is doing well at this time. He states that he manages his own care. Discussed and reviewed [facility] services. Patient does not feel like he needs any of our services at this time. He was appreciative of call but declined at this time.  Plan: RN CM will notify [facility] administrative assistant patient declined services and case closed.  [name], RN,BSN,CCM [place] Management Telephonic Care Management Coordinator Direct Phone: [phone] Toll Free: [phone] Fax: [phone]

## 2015-01-02 ENCOUNTER — Other Ambulatory Visit: Payer: Self-pay | Admitting: Internal Medicine

## 2015-01-07 ENCOUNTER — Other Ambulatory Visit: Payer: Self-pay | Admitting: Internal Medicine

## 2015-01-08 ENCOUNTER — Encounter: Payer: Self-pay | Admitting: Internal Medicine

## 2015-01-08 ENCOUNTER — Ambulatory Visit (INDEPENDENT_AMBULATORY_CARE_PROVIDER_SITE_OTHER): Payer: Commercial Managed Care - HMO | Admitting: Internal Medicine

## 2015-01-08 VITALS — BP 150/100 | HR 98 | Temp 97.8°F | Resp 20 | Ht 71.0 in | Wt 205.6 lb

## 2015-01-08 DIAGNOSIS — Z992 Dependence on renal dialysis: Secondary | ICD-10-CM

## 2015-01-08 DIAGNOSIS — G4700 Insomnia, unspecified: Secondary | ICD-10-CM

## 2015-01-08 DIAGNOSIS — R062 Wheezing: Secondary | ICD-10-CM

## 2015-01-08 DIAGNOSIS — I10 Essential (primary) hypertension: Secondary | ICD-10-CM | POA: Diagnosis not present

## 2015-01-08 DIAGNOSIS — J301 Allergic rhinitis due to pollen: Secondary | ICD-10-CM | POA: Diagnosis not present

## 2015-01-08 DIAGNOSIS — N186 End stage renal disease: Secondary | ICD-10-CM

## 2015-01-08 DIAGNOSIS — Z96652 Presence of left artificial knee joint: Secondary | ICD-10-CM | POA: Diagnosis not present

## 2015-01-08 DIAGNOSIS — E1122 Type 2 diabetes mellitus with diabetic chronic kidney disease: Secondary | ICD-10-CM

## 2015-01-08 MED ORDER — AMLODIPINE BESYLATE 5 MG PO TABS: 5.0000 mg | ORAL_TABLET | Freq: Every day | ORAL | Status: DC

## 2015-01-08 MED ORDER — ALBUTEROL SULFATE HFA 108 (90 BASE) MCG/ACT IN AERS: 2.0000 | INHALATION_SPRAY | Freq: Four times a day (QID) | RESPIRATORY_TRACT | Status: DC | PRN

## 2015-01-08 NOTE — Progress Notes (Signed)
[name], male   DOB: 1960/05/10, 54 y.o.   MRN: [id number]    Location:    [facility]   Place of Service:  OFFICE   Chief Complaint  Patient presents with  . Medical Management of Chronic Issues    3 month follow-up    HPI:  54 yo male seen today for f/u. He c/o waking up at night at least 2x/week with wheezing and SOB. He has hx seasonal allergy. No hx asthma. He ran out of [name]  Left knee infection - healed after last procedure at end of Oct. He completed vanco and fortaz abx. He has a f/u appt with Ortho tomorrow. He was released from PT. He is working out on bike at gym now. Knee is still swollen due to scar tissue but he massage it daily  HTN/CHF - intermittent pleural effusion that improve with HD. He takes amlodipine for BP. BP elevated most days. post HD BP 120s/70s  DM - BS 90-120s at home. FBS 108 this AM. No low BS reactions. He takes glipizide prn but has not needed it due to A1c 5.3%. He has chronic numbness in feet and takes gabapentin. Diet controlled  ESRD/HD MWF - he is compliant with tx. Takes phoslo and Vit D supplement. He is going to HD during the day at [facility] on [address]  PMR/chronic pain - pain controlled with prn ibuprofen  Anxiety/insomnia/depression - sleep stable on prn zolpidem. Insurance requests 90day Rx to continue mail order. Occasional panic attack  Hx CVA - stable  Past Medical History  Diagnosis Date  . Polymyalgia rheumatica ([place])   . Hypertension   . Diabetic neuropathy ([place])   . Osteomyelitis of foot, left, acute ([place])   . Anxiety   . Insomnia, unspecified   . Unspecified vitamin D deficiency   . Anemia, unspecified   . Other chronic postoperative pain   . Allergy   . Unspecified osteomyelitis, site unspecified   . Long term (current) use of anticoagulants   . Lacunar infarction ([place]) 2006    RUE/RLE, speech  . Ulcer     diabetic foot   . Arthralgia 2010    polyarticular  . Hemorrhoids, internal  10/2011    small  . CHF (congestive heart failure) ([place])   . CHF (congestive heart failure) ([place]) 07/25/2009    denies  . Hemodialysis access site with mature fistula ([place])   . Myocardial infarction ([place]) 1995  . Pneumonia     "probably 4-5 times" (05/09/2014)  . Type II diabetes mellitus ([place]) dx'd 1995  . History of blood transfusion     "related to the anemia"  . GERD (gastroesophageal reflux disease)     hx "before I lost weight"  . Arthritis     "back, knees" (05/09/2014)  . Chronic lower back pain   . Stroke ([place]) 01/10/06    denies residual on 05/09/2014  . Sleep apnea     "lost weight; no more problem" (05/09/2014)  . Coughing     pt. reports that he has drainage from sinus infection  . ESRD (end stage renal disease) on dialysis [facility])     started 12/2012; "MWF; [address] "   . Renal insufficiency   . Unspecified hereditary and idiopathic peripheral neuropathy     feet    Past Surgical History  Procedure Laterality Date  . Amputation  01/21/2012    Procedure: AMPUTATION RAY;  Surgeon: [name], MD;  Location:  [place] [facility];  Service: Orthopedics;  Laterality: Left;  Left Foot 4th Ray Amputation  . Anterior cervical decomp/discectomy fusion  02/2011  . Knee arthroscopy Left 08-25-2012  . Toe amputation Bilateral     "I've lost 7 toes over the last 7 years" (05/09/2014)  . Refractive surgery Bilateral   . Bascilic vein transposition Left 10/19/2012    Procedure: BASCILIC VEIN TRANSPOSITION;  Surgeon: Serafina Mitchell, MD;  Location: [place];  Service: Vascular;  Laterality: Left;  . Tonsillectomy    . Amputation Left 05/04/2013    Procedure: AMPUTATION DIGIT;  Surgeon: [name], MD;  Location: [place];  Service: Orthopedics;  Laterality: Left;  Left Great Toe Amputation at [facility]  . Toe surgery Left April 2015    Big toe removed on left foot.  . Total knee arthroplasty Left 04/10/2014    Procedure: TOTAL KNEE ARTHROPLASTY;  Surgeon: [name], MD;  Location: [place];   Service: Orthopedics;  Laterality: Left;  . Wound debridement Left 05/09/2014    Dehiscence Left Total Knee Arthroplasty Incision  . Back surgery    . Uvulopalatopharyngoplasty, tonsillectomy and septoplasty  ~ 1989  . I&d extremity Left 05/09/2014    Procedure: Irrigation and Debridement Left Knee and Closure of Total Knee Arthroplasty Incision;  Surgeon: [name], MD;  Location: [place];  Service: Orthopedics;  Laterality: Left;  . I&d knee with poly exchange Left 05/31/2014    Procedure: IRRIGATION AND DEBRIDEMENT LEFT KNEE, PLACE ANTIBIOTIC BEADS,  POLY EXCHANGE;  Surgeon: [name], MD;  Location: [place];  Service: Orthopedics;  Laterality: Left;  . Excisional total knee arthroplasty with antibiotic spacers Left 08/07/2014    Procedure: Replace Left Total Knee Arthroplasty,  Place Antibiotic Spacer;  Surgeon: [name], MD;  Location: [place];  Service: Orthopedics;  Laterality: Left;  . Total knee revision Left 10/25/2014    Procedure: LEFT TOTAL KNEE REVISION;  Surgeon: [name], MD;  Location: [place];  Service: Orthopedics;  Laterality: Left;    Patient Care Team: Gildardo Cranker, DO as PCP - General (Internal Medicine)  Social History   Social History  . Marital Status: Married    Spouse Name: N/A  . Number of Children: N/A  . Years of Education: N/A   Occupational History  . Not on file.   Social History Main Topics  . Smoking status: Current Every Day Smoker -- 0.12 packs/day for 32 years    Types: Cigarettes  . Smokeless tobacco: Former Systems developer    Quit date: 10/06/2014     Comment: 3 -4 cig daily   . Alcohol Use: No  . Drug Use: No  . Sexual Activity: Yes   Other Topics Concern  . Not on file   Social History Narrative     reports that he has been smoking Cigarettes.  He has a 3.84 pack-year smoking history. He quit smokeless tobacco use about 3 months ago. He reports that he does not drink alcohol or use illicit drugs.  Allergies  Allergen Reactions  .  Morphine And Related Other (See Comments)    hallucinations  . Tygacil [Tigecycline] Nausea And Vomiting and Other (See Comments)    Makes him feel crazy    Medications: Patient's Medications  New Prescriptions   No medications on file  Previous Medications   ALBUTEROL (PROVENTIL HFA;VENTOLIN HFA) 108 (90 BASE) MCG/ACT INHALER    Inhale 2 puffs into the lungs every 6 (six) hours as needed for  wheezing or shortness of breath.   AMLODIPINE (NORVASC) 5 MG TABLET    Take 5 mg by mouth daily.    ASPIRIN EC 325 MG TABLET    Take 1 tablet (325 mg total) by mouth daily.   CALCIUM ACETATE (PHOSLO) 667 MG CAPSULE    Take 667 mg by mouth 3 (three) times daily with meals.   CHOLECALCIFEROL (VITAMIN D PO)    Take 1 tablet by mouth every Monday, Wednesday, and Friday. Take at dialysis   FEXOFENADINE (ALLEGRA) 180 MG TABLET    Take 1 tablet (180 mg total) by mouth daily.   FLUTICASONE (FLONASE) 50 MCG/ACT NASAL SPRAY    Place 2 sprays into both nostrils daily.   GABAPENTIN (NEURONTIN) 100 MG CAPSULE    TAKE 2 CAPSULES TWICE DAILY   GLIPIZIDE (GLUCOTROL) 5 MG TABLET    Take 1 tablet (5 mg total) by mouth daily before breakfast.   IBUPROFEN (ADVIL,MOTRIN) 200 MG TABLET    Take 200 mg by mouth daily as needed (pain).   MONTELUKAST (SINGULAIR) 10 MG TABLET    Take 1 tablet (10 mg total) by mouth at bedtime.   ZOLPIDEM (AMBIEN) 10 MG TABLET    TAKE 1 TABLET AT BEDTIME AS NEEDED FOR SLEEP  Modified Medications   No medications on file  Discontinued Medications   CEFUROXIME (CEFTIN) 500 MG TABLET    Take 1 tab po after dialysis MWF x 10 doses   OXYCODONE-ACETAMINOPHEN (ROXICET) 5-325 MG TABLET    Take 1 tablet by mouth every 4 (four) hours as needed for severe pain.   SILDENAFIL (VIAGRA) 25 MG TABLET    Take 2 tablets (50 mg total) by mouth daily as needed for erectile dysfunction.   VANCOMYCIN (VANCOCIN) 1 GM/200ML SOLN    Inject 200 mLs (1,000 mg total) into the vein every Monday, Wednesday, and Friday with  hemodialysis.    Review of Systems  Constitutional: Negative for fever, chills, activity change and fatigue.  HENT: Negative for sore throat and trouble swallowing.   Eyes: Negative for visual disturbance.  Respiratory: Positive for shortness of breath and wheezing. Negative for cough and chest tightness.   Cardiovascular: Negative for chest pain, palpitations and leg swelling.  Gastrointestinal: Negative for nausea, vomiting, abdominal pain and blood in stool.  Genitourinary: Negative for urgency, frequency and difficulty urinating.  Musculoskeletal: Positive for joint swelling and gait problem. Negative for arthralgias.  Skin: Negative for rash.  Allergic/Immunologic: Positive for environmental allergies.  Neurological: Negative for weakness and headaches.  Psychiatric/Behavioral: Negative for confusion and sleep disturbance. The patient is not nervous/anxious.     Filed Vitals:   01/08/15 0916  Pulse: 98  Temp: 97.8 F (36.6 C)  TempSrc: Oral  Resp: 20  Height: 5' 11" (1.803 m)  Weight: 205 lb 9.6 oz (93.26 kg)   Body mass index is 28.69 kg/(m^2).  Physical Exam  Constitutional: He appears well-developed and well-nourished.  HENT:  Mouth/Throat: Oropharynx is clear and moist.  Eyes: Pupils are equal, round, and reactive to light. No scleral icterus.  Neck: Neck supple. Carotid bruit is not present. No thyromegaly present.  Cardiovascular: Normal rate, regular rhythm and intact distal pulses.  Exam reveals no gallop and no friction rub.   Murmur (1/6 SEM) heard. Trace LLE swelling. No calf TTP b/l. No RLE swelling. Left arm AVF with thrill/bruit  Pulmonary/Chest: Effort normal. He has wheezes (min scattered end expiratory). He has rales (left base). He exhibits no tenderness.  Abdominal: Soft. Bowel  sounds are normal. He exhibits no distension, no abdominal bruit, no pulsatile midline mass and no mass. There is no tenderness. There is no rebound and no guarding.    Musculoskeletal: He exhibits edema and tenderness.  Left knee marked swelling with reduced flexion/extension. Gait antalgic. Min increased warmth to left knee  Lymphadenopathy:    He has no cervical adenopathy.  Neurological: He is alert.  Skin: Skin is warm and dry. No rash noted.  Psychiatric: He has a normal mood and affect. His behavior is normal. Thought content normal.     Labs reviewed: Admission on 10/25/2014, Discharged on 10/29/2014  Component Date Value Ref Range Status  . ABO/RH(D) 10/25/2014 O NEG   Final  . Antibody Screen 10/25/2014 NEG   Final  . Sample Expiration 10/25/2014 10/28/2014   Final  . Hgb A1c MFr Bld 10/25/2014 6.0* 4.8 - 5.6 % Final   Comment: (NOTE)         Pre-diabetes: 5.7 - 6.4         Diabetes: >6.4         Glycemic control for adults with diabetes: <7.0   . Mean Plasma Glucose 10/25/2014 126   Final   Comment: (NOTE) Performed At: [facility] [place], [place] [id number] Lindon Romp MD [id number]   . Glucose-Capillary 10/25/2014 141* 65 - 99 mg/dL Final  . Sodium 10/25/2014 137  135 - 145 mmol/L Final  . Potassium 10/25/2014 4.5  3.5 - 5.1 mmol/L Final  . Glucose, Bld 10/25/2014 145* 65 - 99 mg/dL Final  . HCT 10/25/2014 37.0* 39.0 - 52.0 % Final  . Hemoglobin 10/25/2014 12.6* 13.0 - 17.0 g/dL Final  . Specimen Description 10/25/2014 SYNOVIAL LEFT KNEE   Final  . Special Requests 10/25/2014 NONE   Final  . Gram Stain 10/25/2014    Final                   Value:NO WBC SEEN NO ORGANISMS SEEN Performed at [facility]   . Culture 10/25/2014    Final                   Value:NO ANAEROBES ISOLATED Performed at [facility]   . Report Status 10/25/2014 10/30/2014 FINAL   Final  . Specimen Description 10/25/2014 SYNOVIAL LEFT KNEE   Final  . Special Requests 10/25/2014 SYNOVIAL FLUID INSIDE SWAB   Final  . Gram Stain 10/25/2014    Final                   Value:NO WBC SEEN NO ORGANISMS  SEEN Performed at [facility]   . Culture 10/25/2014    Final                   Value:NO GROWTH 2 DAYS Performed at [facility]   . Report Status 10/25/2014 10/28/2014 FINAL   Final  . Glucose-Capillary 10/25/2014 129* 65 - 99 mg/dL Final  . Comment 1 10/25/2014 Notify RN   Final  . Glucose-Capillary 10/25/2014 160* 65 - 99 mg/dL Final  . Glucose-Capillary 10/26/2014 127* 65 - 99 mg/dL Final  . Glucose-Capillary 10/26/2014 148* 65 - 99 mg/dL Final  . WBC 10/26/2014 9.5  4.0 - 10.5 K/uL Final  . RBC 10/26/2014 3.52* 4.22 - 5.81 MIL/uL Final  . Hemoglobin 10/26/2014 9.2* 13.0 - 17.0 g/dL Final   Comment: DELTA CHECK NOTED REPEATED TO VERIFY   . HCT 10/26/2014 30.5* 39.0 -  52.0 % Final  . MCV 10/26/2014 86.6  78.0 - 100.0 fL Final  . MCH 10/26/2014 26.1  26.0 - 34.0 pg Final  . MCHC 10/26/2014 30.2  30.0 - 36.0 g/dL Final  . RDW 10/26/2014 19.3* 11.5 - 15.5 % Final  . Platelets 10/26/2014 216  150 - 400 K/uL Final  . Sodium 10/26/2014 138  135 - 145 mmol/L Final  . Potassium 10/26/2014 5.0  3.5 - 5.1 mmol/L Final  . Chloride 10/26/2014 104  101 - 111 mmol/L Final  . CO2 10/26/2014 20* 22 - 32 mmol/L Final  . Glucose, Bld 10/26/2014 81  65 - 99 mg/dL Final  . BUN 10/26/2014 51* 6 - 20 mg/dL Final  . Creatinine, Ser 10/26/2014 8.81* 0.61 - 1.24 mg/dL Final  . Calcium 10/26/2014 8.6* 8.9 - 10.3 mg/dL Final  . Phosphorus 10/26/2014 9.0* 2.5 - 4.6 mg/dL Final  . Albumin 10/26/2014 2.3* 3.5 - 5.0 g/dL Final  . GFR calc non Af Amer 10/26/2014 6* >60 mL/min Final  . GFR calc Af Amer 10/26/2014 7* >60 mL/min Final   Comment: (NOTE) The eGFR has been calculated using the CKD EPI equation. This calculation has not been validated in all clinical situations. eGFR's persistently <60 mL/min signify possible Chronic Kidney Disease.   . Anion gap 10/26/2014 14  5 - 15 Final  . Glucose-Capillary 10/26/2014 50* 65 - 99 mg/dL Final  . Glucose-Capillary 10/26/2014 70  65 - 99  mg/dL Final  . Comment 1 10/26/2014 Notify RN   Final  . Glucose-Capillary 10/26/2014 68  65 - 99 mg/dL Final  . Comment 1 10/26/2014 Notify RN   Final  . Glucose-Capillary 10/26/2014 124* 65 - 99 mg/dL Final  . Glucose-Capillary 10/27/2014 99  65 - 99 mg/dL Final  . Glucose-Capillary 10/27/2014 111* 65 - 99 mg/dL Final  . Glucose-Capillary 10/27/2014 166* 65 - 99 mg/dL Final  . Glucose-Capillary 10/27/2014 71  65 - 99 mg/dL Final  . Glucose-Capillary 10/27/2014 54* 65 - 99 mg/dL Final  . Glucose-Capillary 10/27/2014 65  65 - 99 mg/dL Final  . Glucose-Capillary 10/27/2014 98  65 - 99 mg/dL Final  . Glucose-Capillary 10/28/2014 95  65 - 99 mg/dL Final  . WBC 10/28/2014 7.8  4.0 - 10.5 K/uL Final  . RBC 10/28/2014 3.15* 4.22 - 5.81 MIL/uL Final  . Hemoglobin 10/28/2014 8.3* 13.0 - 17.0 g/dL Final  . HCT 10/28/2014 27.1* 39.0 - 52.0 % Final  . MCV 10/28/2014 86.0  78.0 - 100.0 fL Final  . MCH 10/28/2014 26.3  26.0 - 34.0 pg Final  . MCHC 10/28/2014 30.6  30.0 - 36.0 g/dL Final  . RDW 10/28/2014 19.0* 11.5 - 15.5 % Final  . Platelets 10/28/2014 189  150 - 400 K/uL Final  . Neutrophils Relative % 10/28/2014 73   Final  . Neutro Abs 10/28/2014 5.6  1.7 - 7.7 K/uL Final  . Lymphocytes Relative 10/28/2014 14   Final  . Lymphs Abs 10/28/2014 1.1  0.7 - 4.0 K/uL Final  . Monocytes Relative 10/28/2014 11   Final  . Monocytes Absolute 10/28/2014 0.9  0.1 - 1.0 K/uL Final  . Eosinophils Relative 10/28/2014 2   Final  . Eosinophils Absolute 10/28/2014 0.2  0.0 - 0.7 K/uL Final  . Basophils Relative 10/28/2014 0   Final  . Basophils Absolute 10/28/2014 0.0  0.0 - 0.1 K/uL Final  . Sodium 10/28/2014 136  135 - 145 mmol/L Final  . Potassium 10/28/2014 5.1  3.5 - 5.1  mmol/L Final  . Chloride 10/28/2014 101  101 - 111 mmol/L Final  . CO2 10/28/2014 22  22 - 32 mmol/L Final  . Glucose, Bld 10/28/2014 106* 65 - 99 mg/dL Final  . BUN 10/28/2014 50* 6 - 20 mg/dL Final  . Creatinine, Ser 10/28/2014  7.40* 0.61 - 1.24 mg/dL Final  . Calcium 10/28/2014 8.3* 8.9 - 10.3 mg/dL Final  . Phosphorus 10/28/2014 9.7* 2.5 - 4.6 mg/dL Final  . Albumin 10/28/2014 1.9* 3.5 - 5.0 g/dL Final  . GFR calc non Af Amer 10/28/2014 7* >60 mL/min Final  . GFR calc Af Amer 10/28/2014 9* >60 mL/min Final   Comment: (NOTE) The eGFR has been calculated using the CKD EPI equation. This calculation has not been validated in all clinical situations. eGFR's persistently <60 mL/min signify possible Chronic Kidney Disease.   . Anion gap 10/28/2014 13  5 - 15 Final  . Glucose-Capillary 10/28/2014 114* 65 - 99 mg/dL Final  . Comment 1 10/28/2014 Notify RN   Final  . Glucose-Capillary 10/28/2014 211* 65 - 99 mg/dL Final  . Comment 1 10/28/2014 Notify RN   Final  . Glucose-Capillary 10/28/2014 127* 65 - 99 mg/dL Final  . Glucose-Capillary 10/29/2014 124* 65 - 99 mg/dL Final    No results found.   Assessment/Plan   ICD-9-CM ICD-10-CM   1. Essential hypertension, benign - suboptimally controlled 401.1 I10   2. Wheezing - due to #3 786.07 R06.2 albuterol (PROVENTIL HFA;VENTOLIN HFA) 108 (90 Base) MCG/ACT inhaler  3. Seasonal allergic rhinitis due to pollen 477.0 J30.1 albuterol (PROVENTIL HFA;VENTOLIN HFA) 108 (90 Base) MCG/ACT inhaler  4. Controlled type 2 diabetes mellitus with chronic kidney disease on chronic dialysis, without long-term current use of insulin (HCC) - stable 250.40 E11.22    585.9 N18.6    V45.11 Z99.2   5. Insomnia - stable 780.52 G47.00   6. Status post total left knee replacement with infection now resolved V43.65 Z96.652    Take extra dose of amlodipine on non dialysis days when SBP > 150  Continue other medications as ordered. New rx sent to pharmacy  Follow up with Ortho as scheduled  Follow up with dialysis as scheduled  Follow up in 3 mos for routine visit   S. Perlie Gold  [facility] [address] [place], [place]  [address] [phone] Cell (Monday-Friday 8 AM - 5 PM) [phone] After 5 PM and follow prompts

## 2015-01-08 NOTE — Patient Instructions (Signed)
Take extra dose of amlodipine on non dialysis days when systolic (top) blood pressure > 150  Continue other medications as ordered  Follow up with Ortho as scheduled  Follow up with dialysis as scheduled  Follow up in 3 mos for routine visit

## 2015-01-29 ENCOUNTER — Other Ambulatory Visit: Payer: Self-pay

## 2015-01-29 DIAGNOSIS — J301 Allergic rhinitis due to pollen: Secondary | ICD-10-CM

## 2015-01-29 MED ORDER — MONTELUKAST SODIUM 10 MG PO TABS: 10.0000 mg | ORAL_TABLET | Freq: Every day | ORAL | Status: DC

## 2015-03-05 ENCOUNTER — Other Ambulatory Visit: Payer: Self-pay

## 2015-03-05 NOTE — Patient Outreach (Signed)
[facility]) Care Management  03/05/2015  Matthew Kline December 25, 1960 [id number]  Telephone Screen  Referral Date: 03/04/15 Referral Source: [facility] Tier 4 list Referral Reason: "DM with 5 ED visits and no admits"   Outreach attempt #1 to patient. No answer.     Plan: RN CM will make outreach attempt to patient within a week.  [name], RN,BSN,CCM [place] Management Telephonic Care Management Coordinator Direct Phone: [phone] Toll Free: [phone] Fax: [phone]

## 2015-03-06 ENCOUNTER — Other Ambulatory Visit: Payer: Self-pay

## 2015-03-06 NOTE — Patient Outreach (Signed)
[facility]) Care Management  03/06/2015  Matthew Kline 03/18/1960 [id number]  Telephone Screen  Referral Date: 03/04/15 Referral Source: [facility] Tier 4 list Referral Reason: "DM with 5 ED visits and no admits"   Outreach attempt #2 to patient. No answer. RN CM left HIPAA compliant voicemail message along with contact info.   Plan: RN CM will make outreach attempt to patient within a week.  [name], RN,BSN,CCM [place] Management Telephonic Care Management Coordinator Direct Phone: [phone] Toll Free: [phone] Fax: [phone]'

## 2015-03-11 ENCOUNTER — Other Ambulatory Visit: Payer: Self-pay

## 2015-03-11 ENCOUNTER — Ambulatory Visit: Payer: Self-pay

## 2015-03-11 NOTE — Patient Outreach (Signed)
[facility]) Care Management  03/11/2015  Matthew Kline [birth year] [id number]   Telephone Screen  Referral Date: 03/04/15 Referral Source: [place] 4 list Referral Reason: "DM with 5 ED visits and no admits"   Outreach attempt # 3 to patient. No answer and unable to leave message.   Plan: RN CM will send unsuccessful outreach letter to patient. RN CM will close case if no response from patient within 10 business days.   [name], RN,BSN,CCM [place] Management Telephonic Care Management Coordinator Direct Phone: [phone] Toll Free: [phone] Fax: [phone]

## 2015-03-25 ENCOUNTER — Other Ambulatory Visit: Payer: Self-pay

## 2015-03-25 NOTE — Patient Outreach (Signed)
[facility]) Care Management  03/25/2015  Matthew Kline [birth year] [id number]   Telephone Screen  Referral Date: 03/04/15 Referral Source: [facility] Tier 4 list Referral Reason: "DM with 5 ED visits and no admits"   Multiple attempts to establish contact with patient. No response from unsuccessful outreach letter. Case is being closed at this time.    Plan: RN CM will notify [facility] administrative assistant of case closure. RN CM will send MD case closure letter.   [name], RN,BSN,CCM [place] Management Telephonic Care Management Coordinator Direct Phone: [phone] Toll Free: [phone] Fax: [phone]

## 2015-03-27 ENCOUNTER — Encounter: Payer: Self-pay | Admitting: *Deleted

## 2015-03-27 ENCOUNTER — Telehealth: Payer: Self-pay | Admitting: *Deleted

## 2015-03-27 DIAGNOSIS — M25569 Pain in unspecified knee: Secondary | ICD-10-CM

## 2015-03-27 NOTE — Telephone Encounter (Signed)
Matthew Kline with [employer] called and stated that she needs a [employer] Referral done for patient because his has expired and patient is seeing Dr. Sharol Given [phone]) on Thursday for Knee pain. Is this ok to place referral? Please Advise.

## 2015-03-27 NOTE — Telephone Encounter (Signed)
Ok for ortho referral for knee pain

## 2015-03-28 NOTE — Addendum Note (Signed)
Addended by: Rafael Bihari A on: 03/28/2015 10:01 AM   Modules accepted: Orders

## 2015-03-28 NOTE — Telephone Encounter (Signed)
Referral placed.

## 2015-04-16 ENCOUNTER — Ambulatory Visit (INDEPENDENT_AMBULATORY_CARE_PROVIDER_SITE_OTHER): Payer: Commercial Managed Care - HMO | Admitting: Internal Medicine

## 2015-04-16 ENCOUNTER — Encounter: Payer: Self-pay | Admitting: Internal Medicine

## 2015-04-16 VITALS — BP 150/90 | HR 89 | Temp 98.2°F | Resp 20 | Ht 71.0 in | Wt 203.3 lb

## 2015-04-16 DIAGNOSIS — Z992 Dependence on renal dialysis: Secondary | ICD-10-CM | POA: Diagnosis not present

## 2015-04-16 DIAGNOSIS — J301 Allergic rhinitis due to pollen: Secondary | ICD-10-CM | POA: Diagnosis not present

## 2015-04-16 DIAGNOSIS — K5900 Constipation, unspecified: Secondary | ICD-10-CM | POA: Diagnosis not present

## 2015-04-16 DIAGNOSIS — M25462 Effusion, left knee: Secondary | ICD-10-CM | POA: Diagnosis not present

## 2015-04-16 DIAGNOSIS — G4700 Insomnia, unspecified: Secondary | ICD-10-CM | POA: Diagnosis not present

## 2015-04-16 DIAGNOSIS — E1122 Type 2 diabetes mellitus with diabetic chronic kidney disease: Secondary | ICD-10-CM

## 2015-04-16 DIAGNOSIS — N186 End stage renal disease: Secondary | ICD-10-CM | POA: Diagnosis not present

## 2015-04-16 DIAGNOSIS — I10 Essential (primary) hypertension: Secondary | ICD-10-CM

## 2015-04-16 NOTE — Progress Notes (Signed)
[name], male   DOB: September 12, 1960, 55 y.o.   MRN: [id number]    Location:    [facility]   Place of Service:  OFFICE   Chief Complaint  Patient presents with  . Medical Management of Chronic Issues    3 month follow-up for routine visit    HPI:  55 yo male seen today for f/u.   Left knee infection - healed after last procedure at end of Oct. He completed vanco and fortaz abx. He has a f/u appt with Ortho tomorrow. He was released from PT. He is working out on bike at gym now. Knee is still swollen due to scar tissue but he massages it. Swelling is resolving slowly  HTN/CHF - intermittent pleural effusion that improve with HD. He takes amlodipine for BP on TThSaSun. SBP 130s most days. post HD BP 100s/70s. He has noticed more swelling in extremities  DM - BS 90-120s at home. FBS 108 this AM. No low BS reactions. He takes glipizide prn. A1c 6.9% in Dec 2016 at HD. He has chronic numbness in feet and takes gabapentin. Diet controlled. He needs diabetic inserts but left application at home  ESRD/HD MWF - he is compliant with tx. He reports reduced urine output. Takes phoslo and Vit D supplement. He is going to HD during the day at [facility] on [address]  PMR/chronic pain - pain controlled with prn ibuprofen  Anxiety/insomnia/depression - not sleeping well on prn zolpidem. He gets max 4 hrs after taking med. Tried OTC melatonin without relief. Occasional panic attack  Hx CVA - stable  Past Medical History  Diagnosis Date  . Polymyalgia rheumatica ([place])   . Hypertension   . Diabetic neuropathy ([place])   . Osteomyelitis of foot, left, acute ([place])   . Anxiety   . Insomnia, unspecified   . Unspecified vitamin D deficiency   . Anemia, unspecified   . Other chronic postoperative pain   . Allergy   . Unspecified osteomyelitis, site unspecified   . Long term (current) use of anticoagulants   . Lacunar infarction ([place]) 2006    RUE/RLE, speech  . Ulcer     diabetic  foot   . Arthralgia 2010    polyarticular  . Hemorrhoids, internal 10/2011    small  . CHF (congestive heart failure) ([place])   . CHF (congestive heart failure) ([name]) 07/25/2009    denies  . Hemodialysis access site with mature fistula ([place])   . Myocardial infarction ([place]) 1995  . Pneumonia     "probably 4-5 times" (05/09/2014)  . Type II diabetes mellitus ([place]) dx'd 1995  . History of blood transfusion     "related to the anemia"  . GERD (gastroesophageal reflux disease)     hx "before I lost weight"  . Arthritis     "back, knees" (05/09/2014)  . Chronic lower back pain   . Stroke ([place]) 01/10/06    denies residual on 05/09/2014  . Sleep apnea     "lost weight; no more problem" (05/09/2014)  . Coughing     pt. reports that he has drainage from sinus infection  . ESRD (end stage renal disease) on dialysis [facility])     started 12/2012; "MWF; [place] "   . Renal insufficiency   . Unspecified hereditary and idiopathic peripheral neuropathy     feet  . Knee pain, left     Past Surgical History  Procedure Laterality Date  . Amputation  01/21/2012  Procedure: AMPUTATION RAY;  Surgeon: [name], MD;  Location: [place];  Service: Orthopedics;  Laterality: Left;  Left Foot 4th Ray Amputation  . Anterior cervical decomp/discectomy fusion  02/2011  . Knee arthroscopy Left 08-25-2012  . Toe amputation Bilateral     "I've lost 7 toes over the last 7 years" (05/09/2014)  . Refractive surgery Bilateral   . Bascilic vein transposition Left 10/19/2012    Procedure: BASCILIC VEIN TRANSPOSITION;  Surgeon: Serafina Mitchell, MD;  Location: [place];  Service: Vascular;  Laterality: Left;  . Tonsillectomy    . Amputation Left 05/04/2013    Procedure: AMPUTATION DIGIT;  Surgeon: [name], MD;  Location: [place];  Service: Orthopedics;  Laterality: Left;  Left Great Toe Amputation at [facility]  . Toe surgery Left April 2015    Big toe removed on left foot.  . Total knee arthroplasty Left 04/10/2014      Procedure: TOTAL KNEE ARTHROPLASTY;  Surgeon: [name], MD;  Location: [place];  Service: Orthopedics;  Laterality: Left;  . Wound debridement Left 05/09/2014    Dehiscence Left Total Knee Arthroplasty Incision  . Back surgery    . Uvulopalatopharyngoplasty, tonsillectomy and septoplasty  ~ 1989  . I&d extremity Left 05/09/2014    Procedure: Irrigation and Debridement Left Knee and Closure of Total Knee Arthroplasty Incision;  Surgeon: [name], MD;  Location: [place];  Service: Orthopedics;  Laterality: Left;  . I&d knee with poly exchange Left 05/31/2014    Procedure: IRRIGATION AND DEBRIDEMENT LEFT KNEE, PLACE ANTIBIOTIC BEADS,  POLY EXCHANGE;  Surgeon: [name], MD;  Location: [place];  Service: Orthopedics;  Laterality: Left;  . Excisional total knee arthroplasty with antibiotic spacers Left 08/07/2014    Procedure: Replace Left Total Knee Arthroplasty,  Place Antibiotic Spacer;  Surgeon: [name], MD;  Location: [place];  Service: Orthopedics;  Laterality: Left;  . Total knee revision Left 10/25/2014    Procedure: LEFT TOTAL KNEE REVISION;  Surgeon: [name], MD;  Location: [name];  Service: Orthopedics;  Laterality: Left;    Patient Care Team: Gildardo Cranker, DO as PCP - General (Internal Medicine)  Social History   Social History  . Marital Status: Married    Spouse Name: N/A  . Number of Children: N/A  . Years of Education: N/A   Occupational History  . Not on file.   Social History Main Topics  . Smoking status: Current Every Day Smoker -- 0.12 packs/day for 32 years    Types: Cigarettes  . Smokeless tobacco: Former Systems developer    Quit date: 10/06/2014     Comment: 3 -4 cig daily   . Alcohol Use: No  . Drug Use: No  . Sexual Activity: Yes   Other Topics Concern  . Not on file   Social History Narrative     reports that he has been smoking Cigarettes.  He has a 3.84 pack-year smoking history. He quit smokeless tobacco use about 6 months ago. He reports  that he does not drink alcohol or use illicit drugs.  Allergies  Allergen Reactions  . Morphine And Related Other (See Comments)    hallucinations  . Tygacil [Tigecycline] Nausea And Vomiting and Other (See Comments)    Makes him feel crazy    Medications: Patient's Medications  New Prescriptions   No medications on file  Previous Medications   ALBUTEROL (PROVENTIL HFA;VENTOLIN HFA) 108 (90 BASE) MCG/ACT INHALER    Inhale  2 puffs into the lungs every 6 (six) hours as needed for wheezing or shortness of breath.   AMLODIPINE (NORVASC) 5 MG TABLET    Take 1 tablet (5 mg total) by mouth daily. May take additional 1 tab po daily prn on non-dialysis days if systolic blood pressure > 150   ASPIRIN EC 325 MG TABLET    Take 1 tablet (325 mg total) by mouth daily.   CALCIUM ACETATE (PHOSLO) 667 MG CAPSULE    Take 667 mg by mouth 3 (three) times daily with meals.   CHOLECALCIFEROL (VITAMIN D PO)    Take 1 tablet by mouth every Monday, Wednesday, and Friday. Take at dialysis   FEXOFENADINE (ALLEGRA) 180 MG TABLET    Take 1 tablet (180 mg total) by mouth daily.   FLUTICASONE (FLONASE) 50 MCG/ACT NASAL SPRAY    Place 2 sprays into both nostrils daily.   GABAPENTIN (NEURONTIN) 100 MG CAPSULE    TAKE 2 CAPSULES TWICE DAILY   IBUPROFEN (ADVIL,MOTRIN) 200 MG TABLET    Take 200 mg by mouth daily as needed (pain).   MONTELUKAST (SINGULAIR) 10 MG TABLET    Take 1 tablet (10 mg total) by mouth at bedtime.   ZOLPIDEM (AMBIEN) 10 MG TABLET    TAKE 1 TABLET AT BEDTIME AS NEEDED FOR SLEEP  Modified Medications   No medications on file  Discontinued Medications   No medications on file    Review of Systems  Gastrointestinal: Positive for constipation.  Genitourinary: Positive for decreased urine volume.  Musculoskeletal: Positive for joint swelling, arthralgias and gait problem.  Skin: Positive for rash.  Psychiatric/Behavioral: Positive for sleep disturbance.  All other systems reviewed and are  negative.   Filed Vitals:   04/16/15 0931  BP: 150/90  Pulse: 89  Temp: 98.2 F (36.8 C)  TempSrc: Oral  Resp: 20  Height: '5\' 11"'  (1.803 m)  Weight: 203 lb 4.8 oz (92.216 kg)  SpO2: 95%   Body mass index is 28.37 kg/(m^2).  Physical Exam  Constitutional: He is oriented to person, place, and time. He appears well-developed and well-nourished.  HENT:  Mouth/Throat: Oropharynx is clear and moist.  Eyes: Pupils are equal, round, and reactive to light. No scleral icterus.  Neck: Neck supple. Carotid bruit is not present.  Cardiovascular: Normal rate, regular rhythm and intact distal pulses.  Exam reveals no gallop and no friction rub.   Murmur (1/6 SEM) heard. Left arm AVF with palpable thrill/audible bruit. No LE edema b/l. No calf TTP  Pulmonary/Chest: Effort normal. He has wheezes (end expiratory; scattered). He has no rales. He exhibits no tenderness.  Abdominal: Soft. Bowel sounds are normal. He exhibits no distension, no abdominal bruit, no pulsatile midline mass and no mass. There is no tenderness. There is no rebound and no guarding.  Musculoskeletal: He exhibits edema (left knee with reduced ROM) and tenderness.  Lymphadenopathy:    He has no cervical adenopathy.  Neurological: He is alert and oriented to person, place, and time.  Skin: Skin is warm and dry. Rash (open comedone on face bur no secondary signs of infection) noted.  Psychiatric: He has a normal mood and affect. His behavior is normal. Judgment and thought content normal.     Labs reviewed: No visits with results within 3 Month(s) from this visit. Latest known visit with results is:  Admission on 10/25/2014, Discharged on 10/29/2014  Component Date Value Ref Range Status  . ABO/RH(D) 10/25/2014 O NEG   Final  . Antibody  Screen 10/25/2014 NEG   Final  . Sample Expiration 10/25/2014 10/28/2014   Final  . Hgb A1c MFr Bld 10/25/2014 6.0* 4.8 - 5.6 % Final   Comment: (NOTE)         Pre-diabetes: 5.7 - 6.4          Diabetes: >6.4         Glycemic control for adults with diabetes: <7.0   . Mean Plasma Glucose 10/25/2014 126   Final   Comment: (NOTE) Performed At: [facility] [place], [place] [id number] Lindon Romp MD [id number]   . Glucose-Capillary 10/25/2014 141* 65 - 99 mg/dL Final  . Sodium 10/25/2014 137  135 - 145 mmol/L Final  . Potassium 10/25/2014 4.5  3.5 - 5.1 mmol/L Final  . Glucose, Bld 10/25/2014 145* 65 - 99 mg/dL Final  . HCT 10/25/2014 37.0* 39.0 - 52.0 % Final  . Hemoglobin 10/25/2014 12.6* 13.0 - 17.0 g/dL Final  . Specimen Description 10/25/2014 SYNOVIAL LEFT KNEE   Final  . Special Requests 10/25/2014 NONE   Final  . Gram Stain 10/25/2014    Final                   Value:NO WBC SEEN NO ORGANISMS SEEN Performed at [facility]   . Culture 10/25/2014    Final                   Value:NO ANAEROBES ISOLATED Performed at [facility]   . Report Status 10/25/2014 10/30/2014 FINAL   Final  . Specimen Description 10/25/2014 SYNOVIAL LEFT KNEE   Final  . Special Requests 10/25/2014 SYNOVIAL FLUID INSIDE SWAB   Final  . Gram Stain 10/25/2014    Final                   Value:NO WBC SEEN NO ORGANISMS SEEN Performed at [facility]   . Culture 10/25/2014    Final                   Value:NO GROWTH 2 DAYS Performed at [facility]   . Report Status 10/25/2014 10/28/2014 FINAL   Final  . Glucose-Capillary 10/25/2014 129* 65 - 99 mg/dL Final  . Comment 1 10/25/2014 Notify RN   Final  . Glucose-Capillary 10/25/2014 160* 65 - 99 mg/dL Final  . Glucose-Capillary 10/26/2014 127* 65 - 99 mg/dL Final  . Glucose-Capillary 10/26/2014 148* 65 - 99 mg/dL Final  . WBC 10/26/2014 9.5  4.0 - 10.5 K/uL Final  . RBC 10/26/2014 3.52* 4.22 - 5.81 MIL/uL Final  . Hemoglobin 10/26/2014 9.2* 13.0 - 17.0 g/dL Final   Comment: DELTA CHECK NOTED REPEATED TO VERIFY   . HCT 10/26/2014 30.5* 39.0 - 52.0 % Final  . MCV 10/26/2014  86.6  78.0 - 100.0 fL Final  . MCH 10/26/2014 26.1  26.0 - 34.0 pg Final  . MCHC 10/26/2014 30.2  30.0 - 36.0 g/dL Final  . RDW 10/26/2014 19.3* 11.5 - 15.5 % Final  . Platelets 10/26/2014 216  150 - 400 K/uL Final  . Sodium 10/26/2014 138  135 - 145 mmol/L Final  . Potassium 10/26/2014 5.0  3.5 - 5.1 mmol/L Final  . Chloride 10/26/2014 104  101 - 111 mmol/L Final  . CO2 10/26/2014 20* 22 - 32 mmol/L Final  . Glucose, Bld 10/26/2014 81  65 - 99 mg/dL Final  . BUN 10/26/2014 51* 6 - 20 mg/dL Final  .  Creatinine, Ser 10/26/2014 8.81* 0.61 - 1.24 mg/dL Final  . Calcium 10/26/2014 8.6* 8.9 - 10.3 mg/dL Final  . Phosphorus 10/26/2014 9.0* 2.5 - 4.6 mg/dL Final  . Albumin 10/26/2014 2.3* 3.5 - 5.0 g/dL Final  . GFR calc non Af Amer 10/26/2014 6* >60 mL/min Final  . GFR calc Af Amer 10/26/2014 7* >60 mL/min Final   Comment: (NOTE) The eGFR has been calculated using the CKD EPI equation. This calculation has not been validated in all clinical situations. eGFR's persistently <60 mL/min signify possible Chronic Kidney Disease.   . Anion gap 10/26/2014 14  5 - 15 Final  . Glucose-Capillary 10/26/2014 50* 65 - 99 mg/dL Final  . Glucose-Capillary 10/26/2014 70  65 - 99 mg/dL Final  . Comment 1 10/26/2014 Notify RN   Final  . Glucose-Capillary 10/26/2014 68  65 - 99 mg/dL Final  . Comment 1 10/26/2014 Notify RN   Final  . Glucose-Capillary 10/26/2014 124* 65 - 99 mg/dL Final  . Glucose-Capillary 10/27/2014 99  65 - 99 mg/dL Final  . Glucose-Capillary 10/27/2014 111* 65 - 99 mg/dL Final  . Glucose-Capillary 10/27/2014 166* 65 - 99 mg/dL Final  . Glucose-Capillary 10/27/2014 71  65 - 99 mg/dL Final  . Glucose-Capillary 10/27/2014 54* 65 - 99 mg/dL Final  . Glucose-Capillary 10/27/2014 65  65 - 99 mg/dL Final  . Glucose-Capillary 10/27/2014 98  65 - 99 mg/dL Final  . Glucose-Capillary 10/28/2014 95  65 - 99 mg/dL Final  . WBC 10/28/2014 7.8  4.0 - 10.5 K/uL Final  . RBC 10/28/2014 3.15* 4.22 -  5.81 MIL/uL Final  . Hemoglobin 10/28/2014 8.3* 13.0 - 17.0 g/dL Final  . HCT 10/28/2014 27.1* 39.0 - 52.0 % Final  . MCV 10/28/2014 86.0  78.0 - 100.0 fL Final  . MCH 10/28/2014 26.3  26.0 - 34.0 pg Final  . MCHC 10/28/2014 30.6  30.0 - 36.0 g/dL Final  . RDW 10/28/2014 19.0* 11.5 - 15.5 % Final  . Platelets 10/28/2014 189  150 - 400 K/uL Final  . Neutrophils Relative % 10/28/2014 73   Final  . Neutro Abs 10/28/2014 5.6  1.7 - 7.7 K/uL Final  . Lymphocytes Relative 10/28/2014 14   Final  . Lymphs Abs 10/28/2014 1.1  0.7 - 4.0 K/uL Final  . Monocytes Relative 10/28/2014 11   Final  . Monocytes Absolute 10/28/2014 0.9  0.1 - 1.0 K/uL Final  . Eosinophils Relative 10/28/2014 2   Final  . Eosinophils Absolute 10/28/2014 0.2  0.0 - 0.7 K/uL Final  . Basophils Relative 10/28/2014 0   Final  . Basophils Absolute 10/28/2014 0.0  0.0 - 0.1 K/uL Final  . Sodium 10/28/2014 136  135 - 145 mmol/L Final  . Potassium 10/28/2014 5.1  3.5 - 5.1 mmol/L Final  . Chloride 10/28/2014 101  101 - 111 mmol/L Final  . CO2 10/28/2014 22  22 - 32 mmol/L Final  . Glucose, Bld 10/28/2014 106* 65 - 99 mg/dL Final  . BUN 10/28/2014 50* 6 - 20 mg/dL Final  . Creatinine, Ser 10/28/2014 7.40* 0.61 - 1.24 mg/dL Final  . Calcium 10/28/2014 8.3* 8.9 - 10.3 mg/dL Final  . Phosphorus 10/28/2014 9.7* 2.5 - 4.6 mg/dL Final  . Albumin 10/28/2014 1.9* 3.5 - 5.0 g/dL Final  . GFR calc non Af Amer 10/28/2014 7* >60 mL/min Final  . GFR calc Af Amer 10/28/2014 9* >60 mL/min Final   Comment: (NOTE) The eGFR has been calculated using the CKD EPI equation. This  calculation has not been validated in all clinical situations. eGFR's persistently <60 mL/min signify possible Chronic Kidney Disease.   . Anion gap 10/28/2014 13  5 - 15 Final  . Glucose-Capillary 10/28/2014 114* 65 - 99 mg/dL Final  . Comment 1 10/28/2014 Notify RN   Final  . Glucose-Capillary 10/28/2014 211* 65 - 99 mg/dL Final  . Comment 1 10/28/2014 Notify RN    Final  . Glucose-Capillary 10/28/2014 127* 65 - 99 mg/dL Final  . Glucose-Capillary 10/29/2014 124* 65 - 99 mg/dL Final    No results found.   Assessment/Plan   ICD-9-CM ICD-10-CM   1. Insomnia - failing to change as expected 780.52 G47.00   2. Seasonal allergic rhinitis due to pollen 477.0 J30.1   3. Essential hypertension, benign - stable 401.1 I10   4. ESRD on dialysis ([place]) - MWF 585.6 D78.0 Basic Metabolic Panel   C08.91 Q02.6   5. Controlled type 2 diabetes mellitus with chronic kidney disease on chronic dialysis, without long-term current use of insulin (HCC) 250.40 E11.22 Hemoglobin A1c   585.9 G85.4 Basic Metabolic Panel   J65.65 P94.3 Microalbumin/Creatinine Ratio, Urine  6. Knee swelling, left - stable 719.06 M25.462   7. Constipation, unspecified constipation type 564.00 K59.00     Not an ACEI/ARB candidate due to HD status and hypotension post HD. BP stable overall  Trial belsomra 28m  (samples given) take 1 tab at bedtime for insomnia  Trial linzess 145 mcg (samples given) daily  for constipation  Stop zolpidem  Continue other medications as ordered  Follow up with dialysis as scheduled  Will call with lab results  Follow up with Ortho as scheduled  Follow up in 3 mos for routine visit   Tambria Pfannenstiel S. CPerlie Gold PHosp [facility] [address] StreetGPlant City [place] [phone] (Monday-Friday 8 AM - 5 PM) [phone] 5 PM and follow prompts

## 2015-04-16 NOTE — Patient Instructions (Addendum)
Trial belsomra 10mg   (samples given) take 1 tab at bedtime for insomnia  Trial linzess 145 mcg (samples given) daily  for constipation  Stop zolpidem  Continue other medications as ordered  Follow up with dialysis as scheduled  Will call with lab results  Follow up with Ortho as scheduled  Follow up in 3 mos for routine visit

## 2015-04-17 LAB — BASIC METABOLIC PANEL
BUN/Creatinine Ratio: 8 — ABNORMAL LOW (ref 9–20)
BUN: 64 mg/dL — AB (ref 6–24)
CALCIUM: 9.6 mg/dL (ref 8.7–10.2)
CHLORIDE: 94 mmol/L — AB (ref 96–106)
CO2: 22 mmol/L (ref 18–29)
Creatinine, Ser: 7.91 mg/dL — ABNORMAL HIGH (ref 0.76–1.27)
GFR calc Af Amer: 8 mL/min/{1.73_m2} — ABNORMAL LOW (ref >59)
GFR calc non Af Amer: 7 mL/min/{1.73_m2} — ABNORMAL LOW (ref >59)
GLUCOSE: 99 mg/dL (ref 65–99)
Potassium: 5.5 mmol/L — ABNORMAL HIGH (ref 3.5–5.2)
Sodium: 139 mmol/L (ref 134–144)

## 2015-04-17 LAB — HEMOGLOBIN A1C
ESTIMATED AVERAGE GLUCOSE: 120 mg/dL
Hgb A1c MFr Bld: 5.8 % — ABNORMAL HIGH (ref 4.8–5.6)

## 2015-05-31 ENCOUNTER — Other Ambulatory Visit: Payer: Self-pay | Admitting: Internal Medicine

## 2015-07-04 ENCOUNTER — Other Ambulatory Visit: Payer: Self-pay

## 2015-07-04 ENCOUNTER — Emergency Department (HOSPITAL_COMMUNITY): Payer: Commercial Managed Care - HMO

## 2015-07-04 ENCOUNTER — Inpatient Hospital Stay (HOSPITAL_COMMUNITY)
Admission: EM | Admit: 2015-07-04 | Discharge: 2015-07-06 | DRG: 682 | Disposition: A | Discharge status: Home / Self Care | Payer: Commercial Managed Care - HMO | Attending: Internal Medicine | Admitting: Internal Medicine

## 2015-07-04 ENCOUNTER — Encounter (HOSPITAL_COMMUNITY): Payer: Self-pay

## 2015-07-04 DIAGNOSIS — R0602 Shortness of breath: Secondary | ICD-10-CM | POA: Diagnosis present

## 2015-07-04 DIAGNOSIS — E785 Hyperlipidemia, unspecified: Secondary | ICD-10-CM | POA: Diagnosis present

## 2015-07-04 DIAGNOSIS — I132 Hypertensive heart and chronic kidney disease with heart failure and with stage 5 chronic kidney disease, or end stage renal disease: Secondary | ICD-10-CM | POA: Diagnosis present

## 2015-07-04 DIAGNOSIS — N2581 Secondary hyperparathyroidism of renal origin: Secondary | ICD-10-CM | POA: Diagnosis present

## 2015-07-04 DIAGNOSIS — R52 Pain, unspecified: Secondary | ICD-10-CM

## 2015-07-04 DIAGNOSIS — N186 End stage renal disease: Secondary | ICD-10-CM | POA: Diagnosis present

## 2015-07-04 DIAGNOSIS — R079 Chest pain, unspecified: Secondary | ICD-10-CM | POA: Diagnosis present

## 2015-07-04 DIAGNOSIS — M868X6 Other osteomyelitis, lower leg: Secondary | ICD-10-CM | POA: Diagnosis present

## 2015-07-04 DIAGNOSIS — J9601 Acute respiratory failure with hypoxia: Secondary | ICD-10-CM | POA: Diagnosis present

## 2015-07-04 DIAGNOSIS — Z89432 Acquired absence of left foot: Secondary | ICD-10-CM

## 2015-07-04 DIAGNOSIS — E1143 Type 2 diabetes mellitus with diabetic autonomic (poly)neuropathy: Secondary | ICD-10-CM | POA: Diagnosis present

## 2015-07-04 DIAGNOSIS — F419 Anxiety disorder, unspecified: Secondary | ICD-10-CM | POA: Diagnosis present

## 2015-07-04 DIAGNOSIS — F17210 Nicotine dependence, cigarettes, uncomplicated: Secondary | ICD-10-CM | POA: Diagnosis present

## 2015-07-04 DIAGNOSIS — Z7982 Long term (current) use of aspirin: Secondary | ICD-10-CM | POA: Diagnosis not present

## 2015-07-04 DIAGNOSIS — R778 Other specified abnormalities of plasma proteins: Secondary | ICD-10-CM | POA: Diagnosis present

## 2015-07-04 DIAGNOSIS — E1169 Type 2 diabetes mellitus with other specified complication: Secondary | ICD-10-CM | POA: Diagnosis present

## 2015-07-04 DIAGNOSIS — Z96652 Presence of left artificial knee joint: Secondary | ICD-10-CM | POA: Diagnosis present

## 2015-07-04 DIAGNOSIS — G253 Myoclonus: Secondary | ICD-10-CM | POA: Diagnosis not present

## 2015-07-04 DIAGNOSIS — Z79899 Other long term (current) drug therapy: Secondary | ICD-10-CM

## 2015-07-04 DIAGNOSIS — I252 Old myocardial infarction: Secondary | ICD-10-CM

## 2015-07-04 DIAGNOSIS — R0902 Hypoxemia: Secondary | ICD-10-CM | POA: Insufficient documentation

## 2015-07-04 DIAGNOSIS — R7989 Other specified abnormal findings of blood chemistry: Secondary | ICD-10-CM | POA: Diagnosis not present

## 2015-07-04 DIAGNOSIS — I5033 Acute on chronic diastolic (congestive) heart failure: Secondary | ICD-10-CM | POA: Diagnosis present

## 2015-07-04 DIAGNOSIS — E8770 Fluid overload, unspecified: Secondary | ICD-10-CM | POA: Diagnosis present

## 2015-07-04 DIAGNOSIS — N189 Chronic kidney disease, unspecified: Secondary | ICD-10-CM | POA: Diagnosis not present

## 2015-07-04 DIAGNOSIS — G4700 Insomnia, unspecified: Secondary | ICD-10-CM | POA: Diagnosis present

## 2015-07-04 DIAGNOSIS — I5031 Acute diastolic (congestive) heart failure: Secondary | ICD-10-CM | POA: Diagnosis not present

## 2015-07-04 DIAGNOSIS — Z8673 Personal history of transient ischemic attack (TIA), and cerebral infarction without residual deficits: Secondary | ICD-10-CM | POA: Diagnosis not present

## 2015-07-04 DIAGNOSIS — K219 Gastro-esophageal reflux disease without esophagitis: Secondary | ICD-10-CM | POA: Diagnosis present

## 2015-07-04 DIAGNOSIS — I420 Dilated cardiomyopathy: Secondary | ICD-10-CM | POA: Diagnosis present

## 2015-07-04 DIAGNOSIS — E875 Hyperkalemia: Secondary | ICD-10-CM | POA: Diagnosis present

## 2015-07-04 DIAGNOSIS — Z992 Dependence on renal dialysis: Secondary | ICD-10-CM | POA: Diagnosis not present

## 2015-07-04 DIAGNOSIS — D631 Anemia in chronic kidney disease: Secondary | ICD-10-CM | POA: Diagnosis present

## 2015-07-04 DIAGNOSIS — M353 Polymyalgia rheumatica: Secondary | ICD-10-CM | POA: Diagnosis present

## 2015-07-04 DIAGNOSIS — R600 Localized edema: Secondary | ICD-10-CM | POA: Diagnosis present

## 2015-07-04 DIAGNOSIS — R9389 Abnormal findings on diagnostic imaging of other specified body structures: Secondary | ICD-10-CM

## 2015-07-04 DIAGNOSIS — M1990 Unspecified osteoarthritis, unspecified site: Secondary | ICD-10-CM | POA: Diagnosis present

## 2015-07-04 LAB — BASIC METABOLIC PANEL
Anion gap: 14 (ref 5–15)
BUN: 110 mg/dL — ABNORMAL HIGH (ref 6–20)
CHLORIDE: 99 mmol/L — AB (ref 101–111)
CO2: 23 mmol/L (ref 22–32)
CREATININE: 11.28 mg/dL — AB (ref 0.61–1.24)
Calcium: 9.4 mg/dL (ref 8.9–10.3)
GFR calc non Af Amer: 4 mL/min — ABNORMAL LOW (ref >60)
GFR, EST AFRICAN AMERICAN: 5 mL/min — AB (ref >60)
Glucose, Bld: 136 mg/dL — ABNORMAL HIGH (ref 65–99)
POTASSIUM: 5.7 mmol/L — AB (ref 3.5–5.1)
SODIUM: 136 mmol/L (ref 135–145)

## 2015-07-04 LAB — CBC WITH DIFFERENTIAL/PLATELET
BASOS ABS: 0 10*3/uL (ref 0.0–0.1)
Basophils Relative: 0 %
EOS ABS: 0 10*3/uL (ref 0.0–0.7)
Eosinophils Relative: 0 %
HCT: 29.4 % — ABNORMAL LOW (ref 39.0–52.0)
HEMOGLOBIN: 9.3 g/dL — AB (ref 13.0–17.0)
LYMPHS ABS: 1.9 10*3/uL (ref 0.7–4.0)
LYMPHS PCT: 16 %
MCH: 27.8 pg (ref 26.0–34.0)
MCHC: 31.6 g/dL (ref 30.0–36.0)
MCV: 87.8 fL (ref 78.0–100.0)
Monocytes Absolute: 1.2 10*3/uL — ABNORMAL HIGH (ref 0.1–1.0)
Monocytes Relative: 10 %
NEUTROS PCT: 74 %
Neutro Abs: 8.9 10*3/uL — ABNORMAL HIGH (ref 1.7–7.7)
Platelets: 214 10*3/uL (ref 150–400)
RBC: 3.35 MIL/uL — AB (ref 4.22–5.81)
RDW: 15.3 % (ref 11.5–15.5)
WBC: 12 10*3/uL — AB (ref 4.0–10.5)

## 2015-07-04 LAB — I-STAT TROPONIN, ED: TROPONIN I, POC: 0.52 ng/mL — AB (ref 0.00–0.08)

## 2015-07-04 LAB — TROPONIN I: Troponin I: 0.85 ng/mL (ref <0.031)

## 2015-07-04 LAB — BRAIN NATRIURETIC PEPTIDE: B NATRIURETIC PEPTIDE 5: 4065.6 pg/mL — AB (ref 0.0–100.0)

## 2015-07-04 LAB — MRSA PCR SCREENING: MRSA by PCR: NEGATIVE

## 2015-07-04 MED ORDER — RENA-VITE PO TABS
1.0000 | ORAL_TABLET | Freq: Every day | ORAL | Status: DC
  Administered 2015-07-04 – 2015-07-05 (×2): 1 via ORAL
  Filled 2015-07-04 (×2): qty 1

## 2015-07-04 MED ORDER — DEXTROSE 5 % IV SOLN: 1.0000 g | Freq: Three times a day (TID) | INTRAVENOUS | Status: DC

## 2015-07-04 MED ORDER — SODIUM CHLORIDE 0.9 % IV SOLN
2000.0000 mg | Freq: Once | INTRAVENOUS | Status: AC
  Administered 2015-07-04: 2000 mg via INTRAVENOUS
  Filled 2015-07-04 (×2): qty 2000

## 2015-07-04 MED ORDER — ONDANSETRON HCL 4 MG/2ML IJ SOLN: 4.0000 mg | Freq: Four times a day (QID) | INTRAMUSCULAR | Status: DC | PRN

## 2015-07-04 MED ORDER — SEVELAMER CARBONATE 800 MG PO TABS
2400.0000 mg | ORAL_TABLET | Freq: Three times a day (TID) | ORAL | Status: DC
  Administered 2015-07-04 – 2015-07-06 (×4): 2400 mg via ORAL
  Filled 2015-07-04 (×4): qty 3

## 2015-07-04 MED ORDER — GABAPENTIN 300 MG PO CAPS: 600.0000 mg | ORAL_CAPSULE | Freq: Two times a day (BID) | ORAL | Status: DC

## 2015-07-04 MED ORDER — ACETAMINOPHEN 325 MG PO TABS: 650.0000 mg | ORAL_TABLET | ORAL | Status: DC | PRN

## 2015-07-04 MED ORDER — SEVELAMER CARBONATE 800 MG PO TABS: 800.0000 mg | ORAL_TABLET | Freq: Three times a day (TID) | ORAL | Status: DC

## 2015-07-04 MED ORDER — MONTELUKAST SODIUM 10 MG PO TABS
10.0000 mg | ORAL_TABLET | Freq: Every day | ORAL | Status: DC
  Administered 2015-07-04 – 2015-07-05 (×2): 10 mg via ORAL
  Filled 2015-07-04 (×2): qty 1

## 2015-07-04 MED ORDER — ASPIRIN EC 325 MG PO TBEC
325.0000 mg | DELAYED_RELEASE_TABLET | Freq: Every day | ORAL | Status: DC
  Administered 2015-07-04 – 2015-07-05 (×2): 325 mg via ORAL
  Filled 2015-07-04 (×2): qty 1

## 2015-07-04 MED ORDER — DOXERCALCIFEROL 4 MCG/2ML IV SOLN
6.0000 ug | INTRAVENOUS | Status: DC
  Administered 2015-07-04: 6 ug via INTRAVENOUS
  Filled 2015-07-04: qty 4

## 2015-07-04 MED ORDER — HEPARIN BOLUS VIA INFUSION
4000.0000 [IU] | Freq: Once | INTRAVENOUS | Status: AC
  Administered 2015-07-04: 4000 [IU] via INTRAVENOUS
  Filled 2015-07-04: qty 4000

## 2015-07-04 MED ORDER — DEXTROSE 5 % IV SOLN
2.0000 g | Freq: Once | INTRAVENOUS | Status: AC
  Administered 2015-07-04: 2 g via INTRAVENOUS
  Filled 2015-07-04 (×2): qty 2

## 2015-07-04 MED ORDER — FLUTICASONE PROPIONATE 50 MCG/ACT NA SUSP
2.0000 | Freq: Every day | NASAL | Status: DC
  Filled 2015-07-04: qty 16

## 2015-07-04 MED ORDER — DARBEPOETIN ALFA 100 MCG/0.5ML IJ SOSY: 100.0000 ug | PREFILLED_SYRINGE | INTRAMUSCULAR | Status: DC

## 2015-07-04 MED ORDER — HEPARIN (PORCINE) IN NACL 100-0.45 UNIT/ML-% IJ SOLN
2000.0000 [IU]/h | INTRAMUSCULAR | Status: DC
  Administered 2015-07-04: 1100 [IU]/h via INTRAVENOUS
  Filled 2015-07-04 (×4): qty 250

## 2015-07-04 MED ORDER — DOXERCALCIFEROL 4 MCG/2ML IV SOLN
INTRAVENOUS | Status: AC
  Filled 2015-07-04: qty 4

## 2015-07-04 MED ORDER — ALBUTEROL SULFATE (2.5 MG/3ML) 0.083% IN NEBU: 3.0000 mL | INHALATION_SOLUTION | Freq: Four times a day (QID) | RESPIRATORY_TRACT | Status: DC | PRN

## 2015-07-04 MED ORDER — TEMAZEPAM 15 MG PO CAPS: 15.0000 mg | ORAL_CAPSULE | Freq: Every evening | ORAL | Status: DC | PRN

## 2015-07-04 NOTE — ED Notes (Signed)
Pt presents to er with ems from dialysis with complaints of chest pain and shortness of breath, patient states that he has had a low grade fever and having to sleep on extra pillows for the last two days. He had 8 more kg than normal weight on on arrival to dialysis. Patient presents with dialysis access still accessed, iv team consulted to deaccess catheter.

## 2015-07-04 NOTE — ED Notes (Signed)
Patient sitting up in bed with labored breathing. O2 saturation is 97-99% at 2Lpm.  Patient is not diaphoretic.  Will continue to monitor

## 2015-07-04 NOTE — Procedures (Signed)
I have personally attended this patient's dialysis session.  2K bath 4 liter goal  L AVF (With plan for HD again tomorrow to get the other 4 kg) Breathing comfortably on [place], MD [place] Pager 07/04/2015, 9:11 PM

## 2015-07-04 NOTE — ED Provider Notes (Signed)
CSN: [id number]     Arrival date & time 07/04/15  1342 History   First MD Initiated Contact with Patient 07/04/15 1347     Chief Complaint  Patient presents with  . Chest Pain     (Consider location/radiation/quality/duration/timing/severity/associated sxs/prior Treatment) HPI Comments: Patient is a 55 year old male with history of hypertension, end-stage renal disease on hemodialysis. He presents for evaluation of shortness of breath for the past 2 days. He states that he is above his dry weight and has had to use extra pillows to help him breathe at night. He reports chest discomfort during his dialysis session this morning and was brought here for evaluation. He reports feeling chilled and believes he may have had a fever. He also reports cough.  Patient is a 55 y.o. male presenting with chest pain. The history is provided by the patient.  Chest Pain Pain location:  Substernal area Pain quality: tightness   Pain radiates to:  Does not radiate Pain radiates to the back: no   Pain severity:  Moderate Timing:  Constant Progression:  Worsening Chronicity:  New Context: breathing   Relieved by:  Nothing Worsened by:  Nothing tried Ineffective treatments:  None tried   Past Medical History  Diagnosis Date  . Polymyalgia rheumatica ([place])   . Hypertension   . Diabetic neuropathy ([place])   . Osteomyelitis of foot, left, acute ([place])   . Anxiety   . Insomnia, unspecified   . Unspecified vitamin D deficiency   . Anemia, unspecified   . Other chronic postoperative pain   . Allergy   . Unspecified osteomyelitis, site unspecified   . Long term (current) use of anticoagulants   . Lacunar infarction ([place]) 2006    RUE/RLE, speech  . Ulcer     diabetic foot   . Arthralgia 2010    polyarticular  . Hemorrhoids, internal 10/2011    small  . CHF (congestive heart failure) ([place])   . CHF (congestive heart failure) ([place]) 07/25/2009    denies  . Hemodialysis access site with mature fistula  ([place])   . Myocardial infarction ([place]) 1995  . Pneumonia     "probably 4-5 times" (05/09/2014)  . Type II diabetes mellitus ([place]) dx'd 1995  . History of blood transfusion     "related to the anemia"  . GERD (gastroesophageal reflux disease)     hx "before I lost weight"  . Arthritis     "back, knees" (05/09/2014)  . Chronic lower back pain   . Stroke ([place]) 01/10/06    denies residual on 05/09/2014  . Sleep apnea     "lost weight; no more problem" (05/09/2014)  . Coughing     pt. reports that he has drainage from sinus infection  . ESRD (end stage renal disease) on dialysis [facility])     started 12/2012; "MWF; [place] "   . Renal insufficiency   . Unspecified hereditary and idiopathic peripheral neuropathy     feet  . Knee pain, left    Past Surgical History  Procedure Laterality Date  . Amputation  01/21/2012    Procedure: AMPUTATION RAY;  Surgeon: [name], MD;  Location: [place];  Service: Orthopedics;  Laterality: Left;  Left Foot 4th Ray Amputation  . Anterior cervical decomp/discectomy fusion  02/2011  . Knee arthroscopy Left 08-25-2012  . Toe amputation Bilateral     "I've lost 7 toes over the last 7 years" (05/09/2014)  . Refractive surgery Bilateral   . Bascilic  vein transposition Left 10/19/2012    Procedure: BASCILIC VEIN TRANSPOSITION;  Surgeon: Serafina Mitchell, MD;  Location: [facility];  Service: Vascular;  Laterality: Left;  . Tonsillectomy    . Amputation Left 05/04/2013    Procedure: AMPUTATION DIGIT;  Surgeon: [name], MD;  Location: [place];  Service: Orthopedics;  Laterality: Left;  Left Great Toe Amputation at [facility]  . Toe surgery Left April 2015    Big toe removed on left foot.  . Total knee arthroplasty Left 04/10/2014    Procedure: TOTAL KNEE ARTHROPLASTY;  Surgeon: [name], MD;  Location: [place];  Service: Orthopedics;  Laterality: Left;  . Wound debridement Left 05/09/2014    Dehiscence Left Total Knee Arthroplasty Incision  . Back surgery    .  Uvulopalatopharyngoplasty, tonsillectomy and septoplasty  ~ 1989  . I&d extremity Left 05/09/2014    Procedure: Irrigation and Debridement Left Knee and Closure of Total Knee Arthroplasty Incision;  Surgeon: [name], MD;  Location: [facility];  Service: Orthopedics;  Laterality: Left;  . I&d knee with poly exchange Left 05/31/2014    Procedure: IRRIGATION AND DEBRIDEMENT LEFT KNEE, PLACE ANTIBIOTIC BEADS,  POLY EXCHANGE;  Surgeon: [name], MD;  Location: [place];  Service: Orthopedics;  Laterality: Left;  . Excisional total knee arthroplasty with antibiotic spacers Left 08/07/2014    Procedure: Replace Left Total Knee Arthroplasty,  Place Antibiotic Spacer;  Surgeon: [name], MD;  Location: [place];  Service: Orthopedics;  Laterality: Left;  . Total knee revision Left 10/25/2014    Procedure: LEFT TOTAL KNEE REVISION;  Surgeon: [name], MD;  Location: [facility];  Service: Orthopedics;  Laterality: Left;   Family History  Problem Relation Age of Onset  . Hypertension Mother   . Cancer Mother 63    Ovarian  . Heart disease Maternal Aunt   . Stroke Maternal Grandfather    Social History  Substance Use Topics  . Smoking status: Current Every Day Smoker -- 0.12 packs/day for 32 years    Types: Cigarettes  . Smokeless tobacco: Former Systems developer    Quit date: 10/06/2014     Comment: 3 -4 cig daily   . Alcohol Use: No    Review of Systems  Cardiovascular: Positive for chest pain.  All other systems reviewed and are negative.     Allergies  Morphine and related and Tygacil  Home Medications   Prior to Admission medications   Medication Sig Start Date End Date Taking? Authorizing Provider  albuterol (PROVENTIL HFA;VENTOLIN HFA) 108 (90 Base) MCG/ACT inhaler Inhale 2 puffs into the lungs every 6 (six) hours as needed for wheezing or shortness of breath. 01/08/15   Gildardo Cranker, DO  amLODipine (NORVASC) 5 MG tablet Take 1 tablet (5 mg total) by mouth daily. May take additional 1 tab  po daily prn on non-dialysis days if systolic blood pressure > 150 01/08/15   Gildardo Cranker, DO  aspirin EC 325 MG tablet Take 1 tablet (325 mg total) by mouth daily. 10/28/14   [name], MD  calcium acetate (PHOSLO) 667 MG capsule Take 667 mg by mouth 3 (three) times daily with meals.    Historical Provider, MD  Cholecalciferol (VITAMIN D PO) Take 1 tablet by mouth every Monday, Wednesday, and Friday. Take at dialysis    Historical Provider, MD  fexofenadine (ALLEGRA) 180 MG tablet Take 1 tablet (180 mg total) by mouth daily. 08/29/14   Gildardo Cranker, DO  fluticasone (  FLONASE) 50 MCG/ACT nasal spray Place 2 sprays into both nostrils daily. 10/09/14   Gildardo Cranker, DO  gabapentin (NEURONTIN) 100 MG capsule TAKE 2 CAPSULES TWICE DAILY 01/02/15   Gildardo Cranker, DO  ibuprofen (ADVIL,MOTRIN) 200 MG tablet Take 200 mg by mouth daily as needed (pain).    Historical Provider, MD  montelukast (SINGULAIR) 10 MG tablet TAKE 1 TABLET (10 MG TOTAL) BY MOUTH AT BEDTIME. 06/02/15   Gildardo Cranker, DO  zolpidem (AMBIEN) 10 MG tablet TAKE 1 TABLET AT BEDTIME AS NEEDED FOR SLEEP 01/08/15   Monica Carter, DO   BP 151/104 mmHg  Pulse 111  Temp(Src) 99.1 F (37.3 C) (Oral)  Resp 26  Ht 5\' 11"  (1.803 m)  Wt 190 lb 11.2 oz (86.5 kg)  BMI 26.61 kg/m2  SpO2 94% Physical Exam  Constitutional: He is oriented to person, place, and time. He appears well-developed and well-nourished. No distress.  HENT:  Head: Normocephalic and atraumatic.  Mouth/Throat: Oropharynx is clear and moist.  Neck: Normal range of motion. Neck supple.  Cardiovascular: Normal rate and regular rhythm.  Exam reveals no friction rub.   No murmur heard. Pulmonary/Chest: Effort normal and breath sounds normal. No respiratory distress. He has no wheezes. He has no rales.  Abdominal: Soft. Bowel sounds are normal. He exhibits no distension. There is no tenderness.  Musculoskeletal: Normal range of motion. He exhibits no edema.  Neurological:  He is alert and oriented to person, place, and time. Coordination normal.  Skin: Skin is warm and dry. He is not diaphoretic.  Nursing note and vitals reviewed.   ED Course  Procedures (including critical care time) Labs Review Labs Reviewed  BASIC METABOLIC PANEL  CBC WITH DIFFERENTIAL/PLATELET  I-STAT TROPOININ, ED    Imaging Review No results found. I have personally reviewed and evaluated these images and lab results as part of my medical decision-making.   EKG Interpretation   Date/Time:  Friday July 04 2015 13:46:18 EDT Ventricular Rate:  112 PR Interval:    QRS Duration: 78 QT Interval:  328 QTC Calculation: 448 R Axis:   60 Text Interpretation:  Sinus tachycardia Borderline T abnormalities,  lateral leads Confirmed by Olena Willy  MD, Rajon Bisig ([id number]) on 07/04/2015 4:01:50  PM      MDM   Final diagnoses:  None    Patient is a 55 year old male who presents with complaints of chest discomfort and shortness of breath that occurred while being dialyzed. He reports increased shortness of breath over the past several days. His workup reveals a mildly elevated troponin with borderline T wave abnormalities in the lateral leads. He also has evidence for pulmonary edema on his chest x-ray and a potassium of 5.7. I suspect he will need a repeat dialysis session and have spoken with Dr. Florene Glen regarding this. He is recommending admission to the hospitalist service. Dr. Eliseo Squires to admit.    Veryl Speak, MD 07/04/15 [phone]

## 2015-07-04 NOTE — Progress Notes (Signed)
ANTICOAGULATION CONSULT NOTE - Initial Consult  Pharmacy Consult for heparin Indication: chest pain/ACS  Allergies  Allergen Reactions  . Morphine And Related Other (See Comments)    hallucinations  . Tygacil [Tigecycline] Nausea And Vomiting and Other (See Comments)    Makes him feel crazy    Patient Measurements: Height: 5\' 11"  (180.3 cm) Weight: 190 lb 11.2 oz (86.5 kg) IBW/kg (Calculated) : 75.3 Heparin Dosing Weight: 86.5kg  Vital Signs: Temp: 99.1 F (37.3 C) (06/23 1344) Temp Source: Oral (06/23 1344) BP: 167/95 mmHg (06/23 1545) Pulse Rate: 107 (06/23 1545)  Labs:  Recent Labs  07/04/15 1456  HGB 9.3*  HCT 29.4*  PLT 214  CREATININE 11.28*    Estimated Creatinine Clearance: 7.8 mL/min (by C-G formula based on Cr of 11.28).   Medical History: Past Medical History  Diagnosis Date  . Polymyalgia rheumatica ([place])   . Hypertension   . Diabetic neuropathy ([place])   . Osteomyelitis of foot, left, acute ([place])   . Anxiety   . Insomnia, unspecified   . Unspecified vitamin D deficiency   . Anemia, unspecified   . Other chronic postoperative pain   . Allergy   . Unspecified osteomyelitis, site unspecified   . Long term (current) use of anticoagulants   . Lacunar infarction ([place]) 2006    RUE/RLE, speech  . Ulcer     diabetic foot   . Arthralgia 2010    polyarticular  . Hemorrhoids, internal 10/2011    small  . CHF (congestive heart failure) ([place])   . CHF (congestive heart failure) ([place]) 07/25/2009    denies  . Hemodialysis access site with mature fistula ([place])   . Myocardial infarction ([place]) 1995  . Pneumonia     "probably 4-5 times" (05/09/2014)  . Type II diabetes mellitus ([place]) dx'd 1995  . History of blood transfusion     "related to the anemia"  . GERD (gastroesophageal reflux disease)     hx "before I lost weight"  . Arthritis     "back, knees" (05/09/2014)  . Chronic lower back pain   . Stroke ([place]) 01/10/06    denies residual on 05/09/2014   . Sleep apnea     "lost weight; no more problem" (05/09/2014)  . Coughing     pt. reports that he has drainage from sinus infection  . ESRD (end stage renal disease) on dialysis [facility])     started 12/2012; "MWF; [facility] [place] "   . Renal insufficiency   . Unspecified hereditary and idiopathic peripheral neuropathy     feet  . Knee pain, left     Medications:  Infusions:  . heparin      Assessment: 76 yom presented to the ED with CP. Troponin elevated and to start IV heparin. Baseline H/H is low and platelets are WNL. He is not on anticoagulation PTA.   Goal of Therapy:  Heparin level 0.3-0.7 units/ml Monitor platelets by anticoagulation protocol: Yes   Plan:  - Heparin bolus 4000 units IV x 1 - Heparin gtt 1100 units/hr - Check an 8 hr HL - Daily HL and CBC  Tramayne Sebesta, Rande Lawman 07/04/2015,4:30 PM

## 2015-07-04 NOTE — Progress Notes (Signed)
New Admission Note: 07/04/2015  Arrival Method: stretcher Mental Orientation:alert and oriented x 4 Telemetry: 6e10 ST, 2nd verification completed Assessment: completed Skin: 2 RNs checked. No issues  IV: R FA NSL Pain: See pain assessment Tubes: N/A Safety Measures: Bed in low position, bed alarm on. Safety Fall Prevention Plan has been given, discussed and signed. Pt viewed video 905.  Admission: Completed [facility] [place] Orientation: Patient has been orientated to the room, unit and staff.  Family: none at bedside upon arrival to 6e10  Orders have been reviewed and implemented. Will continue to monitor the patient. Call light has been placed within reach and bed alarm has been activated.   Nurse: Manya Silvas, RN Phone number: [phone]

## 2015-07-04 NOTE — Progress Notes (Signed)
Pharmacy Antibiotic Note  Matthew Kline is a 55 y.o. male admitted on 07/04/2015 with pneumonia.  Pharmacy has been consulted for Vancomycin and Cefepime dosing.  ESRD --> normal schedule MWF, missed session Wednesday  Plan: Vancomycin 2 grams iv x 1 tonight after HD Cefepime 2 grams iv x 1 tonight after HD  Follow up HD schedule  Height: 5\' 11"  (180.3 cm) Weight: 190 lb 11.2 oz (86.5 kg) IBW/kg (Calculated) : 75.3  Temp (24hrs), Avg:99.2 F (37.3 C), Min:99.1 F (37.3 C), Max:99.3 F (37.4 C)   Recent Labs Lab 07/04/15 1456  WBC 12.0*  CREATININE 11.28*    Estimated Creatinine Clearance: 7.8 mL/min (by C-G formula based on Cr of 11.28).    Allergies  Allergen Reactions  . Morphine And Related Other (See Comments)    hallucinations  . Tygacil [Tigecycline] Nausea And Vomiting and Other (See Comments)    Makes him feel crazy     Thank you for allowing pharmacy to be a part of this patient's care. Anette Guarneri, PharmD [phone]  07/04/2015 5:08 PM

## 2015-07-04 NOTE — Plan of Care (Signed)
Problem: Education: Goal: Knowledge of [facility] Education information/materials will improve Outcome: Progressing Patient viewed safety video 905.
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
## 2015-07-04 NOTE — H&P (Signed)
History and Physical    Matthew Kline [id number] DOB: 07-14-1960 DOA: 07/04/2015  Referring MD/NP/PA: Dr. Stark Jock PCP: Gildardo Cranker, DO Outpatient Specialists:  Patient coming from: dialysis  Chief Complaint: chest pain/SOB  HPI: Matthew Kline is a 55 y.o. male with medical history significant of ESRD, PMR, and HTN.  He missed dialysis on Wednesday due to having a "PMR" flair and the dialysis center running behind.  He then developed worsening shortness of breath, having to sleep upright.  He went to dialysis today and then developed substernal chest pain during his dialysis session.  Chest pain non-radiating.  Worse with deep breaths and coughing.  Left leg is tender to palpation.   He reports being 8 kg heavier at dialysis today.  Reports chills and "low" grade fevers and cough   ED Course: In the ER, labs showed an elevated K.  Chest x ray showed Findings worrisome for CHF with interstitial and alveolar edema.  Troponin mildly elevated at 0.52.  BNP elevated at 4000.    Review of Systems: all systems reviewed, negative unless stated above in HPI   Past Medical History  Diagnosis Date  . Polymyalgia rheumatica ([place])   . Hypertension   . Diabetic neuropathy ([place])   . Osteomyelitis of foot, left, acute ([place])   . Anxiety   . Insomnia, unspecified   . Unspecified vitamin D deficiency   . Anemia, unspecified   . Other chronic postoperative pain   . Allergy   . Unspecified osteomyelitis, site unspecified   . Long term (current) use of anticoagulants   . 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 Ulcer     diabetic foot   . Arthralgia 2010    polyarticular  . Hemorrhoids, internal 10/2011    small  . CHF (congestive heart failure) ([place])   . CHF (congestive heart failure) ([place]) 07/25/2009    denies  . Hemodialysis access site with mature fistula ([place])   . Myocardial infarction ([place]) 1995  . Pneumonia     "probably 4-5 times" (05/09/2014)  . Type II  diabetes mellitus ([place]) dx'd 1995  . History of blood transfusion     "related to the anemia"  . GERD (gastroesophageal reflux disease)     hx "before I lost weight"  . Arthritis     "back, knees" (05/09/2014)  . Chronic lower back pain   . Stroke ([place]) 01/10/06    denies residual on 05/09/2014  . Sleep apnea     "lost weight; no more problem" (05/09/2014)  . Coughing     pt. reports that he has drainage from sinus infection  . ESRD (end stage renal disease) on dialysis [facility])     started 12/2012; "MWF; [facility] "   . Renal insufficiency   . Unspecified hereditary and idiopathic peripheral neuropathy     feet  . Knee pain, left     Past Surgical History  Procedure Laterality Date  . Amputation  01/21/2012    Procedure: AMPUTATION RAY;  Surgeon: [name], MD;  Location: [place];  Service: Orthopedics;  Laterality: Left;  Left Foot 4th Ray Amputation  . Anterior cervical decomp/discectomy fusion  02/2011  . Knee arthroscopy Left 08-25-2012  . Toe amputation Bilateral     "I've lost 7 toes over the last 7 years" (05/09/2014)  . Refractive surgery Bilateral   . Bascilic vein transposition Left 10/19/2012    Procedure: BASCILIC VEIN TRANSPOSITION;  Surgeon: Serafina Mitchell, MD;  Location: [facility];  Service: Vascular;  Laterality: Left;  . Tonsillectomy    . Amputation Left 05/04/2013    Procedure: AMPUTATION DIGIT;  Surgeon: [name], MD;  Location: [place];  Service: Orthopedics;  Laterality: Left;  Left Great Toe Amputation at [facility]  . Toe surgery Left April 2015    Big toe removed on left foot.  . Total knee arthroplasty Left 04/10/2014    Procedure: TOTAL KNEE ARTHROPLASTY;  Surgeon: [name], MD;  Location: [place];  Service: Orthopedics;  Laterality: Left;  . Wound debridement Left 05/09/2014    Dehiscence Left Total Knee Arthroplasty Incision  . Back surgery    . Uvulopalatopharyngoplasty, tonsillectomy and septoplasty  ~ 1989  . I&d extremity Left 05/09/2014     Procedure: Irrigation and Debridement Left Knee and Closure of Total Knee Arthroplasty Incision;  Surgeon: [name], MD;  Location: [place];  Service: Orthopedics;  Laterality: Left;  . I&d knee with poly exchange Left 05/31/2014    Procedure: IRRIGATION AND DEBRIDEMENT LEFT KNEE, PLACE ANTIBIOTIC BEADS,  POLY EXCHANGE;  Surgeon: [name], MD;  Location: [place];  Service: Orthopedics;  Laterality: Left;  . Excisional total knee arthroplasty with antibiotic spacers Left 08/07/2014    Procedure: Replace Left Total Knee Arthroplasty,  Place Antibiotic Spacer;  Surgeon: [name], MD;  Location: [place];  Service: Orthopedics;  Laterality: Left;  . Total knee revision Left 10/25/2014    Procedure: LEFT TOTAL KNEE REVISION;  Surgeon: [name], MD;  Location: [place];  Service: Orthopedics;  Laterality: Left;     reports that he has been smoking Cigarettes.  He has a 3.84 pack-year smoking history. He quit smokeless tobacco use about 8 months ago. He reports that he does not drink alcohol or use illicit drugs.  Allergies  Allergen Reactions  . Morphine And Related Other (See Comments)    hallucinations  . Tygacil [Tigecycline] Nausea And Vomiting and Other (See Comments)    Makes him feel crazy    Family History  Problem Relation Age of Onset  . Hypertension Mother   . Cancer Mother 59    Ovarian  . Heart disease Maternal Aunt   . Stroke Maternal Grandfather      Prior to Admission medications   Medication Sig Start Date End Date Taking? Authorizing Provider  albuterol (PROVENTIL HFA;VENTOLIN HFA) 108 (90 Base) MCG/ACT inhaler Inhale 2 puffs into the lungs every 6 (six) hours as needed for wheezing or shortness of breath. 01/08/15  Yes Gildardo Cranker, DO  Cholecalciferol (VITAMIN D PO) Take 1 tablet by mouth every Monday, Wednesday, and Friday. Take at dialysis   Yes Historical Provider, MD  fluticasone (FLONASE) 50 MCG/ACT nasal spray Place 2 sprays into both nostrils daily.  10/09/14  Yes Gildardo Cranker, DO  gabapentin (NEURONTIN) 300 MG capsule Take 600 mg by mouth 2 (two) times daily.   Yes Historical Provider, MD  montelukast (SINGULAIR) 10 MG tablet TAKE 1 TABLET (10 MG TOTAL) BY MOUTH AT BEDTIME. 06/02/15  Yes Gildardo Cranker, DO  sevelamer carbonate (RENVELA) 800 MG tablet Take 800-3,200 mg by mouth 3 (three) times daily with meals. 800 mg-1,600 mg with snacks (depends on dietary intake)   Yes Historical Provider, MD  sorbitol 70 % solution Take 30-60 mLs by mouth daily as needed (for constipation).  06/04/15  Yes Historical Provider, MD  temazepam (RESTORIL) 15 MG capsule Take 15 mg by mouth at bedtime as needed for sleep.  05/09/15  Yes Historical Provider, MD  amLODipine (NORVASC) 5 MG tablet Take 1 tablet (5 mg total) by mouth daily. May take additional 1 tab po daily prn on non-dialysis days if systolic blood pressure > 150 01/08/15   Gildardo Cranker, DO  aspirin EC 325 MG tablet Take 1 tablet (325 mg total) by mouth daily. Patient not taking: Reported on 07/04/2015 10/28/14   [name], MD  gabapentin (NEURONTIN) 100 MG capsule TAKE 2 CAPSULES TWICE DAILY Patient not taking: Reported on 07/04/2015 01/02/15   Gildardo Cranker, DO  zolpidem (AMBIEN) 10 MG tablet TAKE 1 TABLET AT BEDTIME AS NEEDED FOR SLEEP Patient not taking: Reported on 07/04/2015 01/08/15   Gildardo Cranker, DO    Physical Exam: Filed Vitals:   07/04/15 1513 07/04/15 1514 07/04/15 1530 07/04/15 1545  BP:  169/89 162/96 167/95  Pulse:  106 107 107  Temp:      TempSrc:      Resp:  18 33 27  Height:      Weight:      SpO2: 85% 94% 97% 96%      Constitutional: tremor in left arm  Filed Vitals:   07/04/15 1513 07/04/15 1514 07/04/15 1530 07/04/15 1545  BP:  169/89 162/96 167/95  Pulse:  106 107 107  Temp:      TempSrc:      Resp:  18 33 27  Height:      Weight:      SpO2: 85% 94% 97% 96%   Eyes: PERRL, lids and conjunctivae normal ENMT: Mucous membranes are moist. Posterior pharynx  clear of any exudate or lesions.Normal dentition.  Neck: normal, supple, no masses, no thyromegaly Respiratory: mild increase work of breathing, crackles at bases Cardiovascular: tachy, +LE edema L>R.  Abdomen: no tenderness, no masses palpated. No hepatosplenomegaly. Bowel sounds positive.  Skin: missing multiple toes on both feet, + edema in b/l legs-- left is larger than right and tender to palpation Appears anxious   Labs on Admission: I have personally reviewed following labs and imaging studies  CBC:  Recent Labs Lab 07/04/15 1456  WBC 12.0*  NEUTROABS 8.9*  HGB 9.3*  HCT 29.4*  MCV 87.8  PLT [id number]   Basic Metabolic Panel:  Recent Labs Lab 07/04/15 1456  NA 136  K 5.7*  CL 99*  CO2 23  GLUCOSE 136*  BUN 110*  CREATININE 11.28*  CALCIUM 9.4   GFR: Estimated Creatinine Clearance: 7.8 mL/min (by C-G formula based on Cr of 11.28). Liver Function Tests: No results for input(s): AST, ALT, ALKPHOS, BILITOT, PROT, ALBUMIN in the last 168 hours. No results for input(s): LIPASE, AMYLASE in the last 168 hours. No results for input(s): AMMONIA in the last 168 hours. Coagulation Profile: No results for input(s): INR, PROTIME in the last 168 hours. Cardiac Enzymes: No results for input(s): CKTOTAL, CKMB, CKMBINDEX, TROPONINI in the last 168 hours. BNP (last 3 results) No results for input(s): PROBNP in the last 8760 hours. HbA1C: No results for input(s): HGBA1C in the last 72 hours. CBG: No results for input(s): GLUCAP in the last 168 hours. Lipid Profile: No results for input(s): CHOL, HDL, LDLCALC, TRIG, CHOLHDL, LDLDIRECT in the last 72 hours. Thyroid Function Tests: No results for input(s): TSH, T4TOTAL, FREET4, T3FREE, THYROIDAB in the last 72 hours. Anemia Panel: No results for input(s): VITAMINB12, FOLATE, FERRITIN, TIBC, IRON, RETICCTPCT in the last 72 hours. Urine analysis:    Component Value Date/Time   COLORURINE YELLOW 01/03/2012 1335  APPEARANCEUR  Clear 07/12/2013 0918   APPEARANCEUR CLEAR 01/03/2012 1335   LABSPEC 1.021 01/03/2012 1335   PHURINE 6.0 01/03/2012 1335   GLUCOSEU Trace* 07/12/2013 0918   HGBUR TRACE* 01/03/2012 1335   BILIRUBINUR Negative 07/12/2013 0918   BILIRUBINUR NEGATIVE 01/03/2012 1335   KETONESUR NEGATIVE 01/03/2012 1335   PROTEINUR 3+* 07/12/2013 0918   PROTEINUR >300* 01/03/2012 1335   UROBILINOGEN 0.2 01/03/2012 1335   NITRITE Negative 07/12/2013 0918   NITRITE NEGATIVE 01/03/2012 1335   LEUKOCYTESUR Negative 07/12/2013 0918   LEUKOCYTESUR NEGATIVE 01/03/2012 1335   Sepsis Labs: Invalid input(s): PROCALCITONIN, LACTICIDVEN No results found for this or any previous visit (from the past 240 hour(s)).   Radiological Exams on Admission: Dg Chest 2 View  07/04/2015  CLINICAL DATA:  Two days of worsening shortness of breath, history of CHF, chronic renal insufficiency with a on dialysis, smoker EXAM: CHEST  2 VIEW COMPARISON:  PA and lateral chest x-ray of October 07, 2014. FINDINGS: The lungs are adequately inflated. The interstitial markings remain increased bilaterally. The cardiac silhouette is enlarged. The pulmonary vascularity is mildly engorged. There are confluent alveolar opacities in both lower lungs. There is no pleural effusion. The patient has undergone previous lower anterior cervical fusion. IMPRESSION: Findings worrisome for CHF with interstitial and alveolar edema. Less likely would be pneumonia. Electronically Signed   By: David  Martinique M.D.   On: 07/04/2015 14:18    EKG: Independently reviewed. NSR- no ST changes  Assessment/Plan Active Problems:   End stage renal disease (HCC)   Leg edema, left   Volume overload   Shortness of breath   Hypoxemia   Volume overload with acute respiratory failure -missed dialysis and need today -wean O2 as tolerated  Chest pain  -appears pleuritic-- worse with deep breathing -will cycle CE  Left leg edema> right leg edema -heparin gtt for  now -duplex LE  Polymyalgia rheumatica  Hyperkalemia -will correct with dialysis  leukocytosis ?etiology -repeat chest x ray after dialysis-- will need to order -watch for fever -add HCAP PNA coverage= de-escalate if x ray negative   DVT prophylaxis: heparin gtt Code Status: full Family Communication: patient/wife at bedside Disposition Plan: admit Consults called: Dr. Lorrene Reid for dialysis Admission status: admit   [place] DO [facility] Pager [phone]  If 7PM-7AM, please contact night-coverage [email or web address] Password [facility]  07/04/2015, 4:19 PM

## 2015-07-05 ENCOUNTER — Inpatient Hospital Stay (HOSPITAL_COMMUNITY): Payer: Commercial Managed Care - HMO

## 2015-07-05 DIAGNOSIS — K219 Gastro-esophageal reflux disease without esophagitis: Secondary | ICD-10-CM

## 2015-07-05 DIAGNOSIS — I5033 Acute on chronic diastolic (congestive) heart failure: Secondary | ICD-10-CM | POA: Diagnosis present

## 2015-07-05 DIAGNOSIS — N186 End stage renal disease: Principal | ICD-10-CM

## 2015-07-05 DIAGNOSIS — M353 Polymyalgia rheumatica: Secondary | ICD-10-CM

## 2015-07-05 DIAGNOSIS — N189 Chronic kidney disease, unspecified: Secondary | ICD-10-CM

## 2015-07-05 DIAGNOSIS — Z992 Dependence on renal dialysis: Secondary | ICD-10-CM

## 2015-07-05 DIAGNOSIS — R7989 Other specified abnormal findings of blood chemistry: Secondary | ICD-10-CM

## 2015-07-05 DIAGNOSIS — I5031 Acute diastolic (congestive) heart failure: Secondary | ICD-10-CM

## 2015-07-05 DIAGNOSIS — D631 Anemia in chronic kidney disease: Secondary | ICD-10-CM

## 2015-07-05 DIAGNOSIS — E785 Hyperlipidemia, unspecified: Secondary | ICD-10-CM

## 2015-07-05 LAB — RENAL FUNCTION PANEL
ANION GAP: 10 (ref 5–15)
Albumin: 2.7 g/dL — ABNORMAL LOW (ref 3.5–5.0)
BUN: 52 mg/dL — ABNORMAL HIGH (ref 6–20)
CALCIUM: 8.8 mg/dL — AB (ref 8.9–10.3)
CHLORIDE: 96 mmol/L — AB (ref 101–111)
CO2: 27 mmol/L (ref 22–32)
Creatinine, Ser: 7.03 mg/dL — ABNORMAL HIGH (ref 0.61–1.24)
GFR calc non Af Amer: 8 mL/min — ABNORMAL LOW (ref >60)
GFR, EST AFRICAN AMERICAN: 9 mL/min — AB (ref >60)
Glucose, Bld: 146 mg/dL — ABNORMAL HIGH (ref 65–99)
Phosphorus: 6.2 mg/dL — ABNORMAL HIGH (ref 2.5–4.6)
Potassium: 4.5 mmol/L (ref 3.5–5.1)
Sodium: 133 mmol/L — ABNORMAL LOW (ref 135–145)

## 2015-07-05 LAB — TROPONIN I
TROPONIN I: 0.67 ng/mL — AB (ref <0.031)
TROPONIN I: 0.77 ng/mL — AB (ref <0.031)
TROPONIN I: 1.02 ng/mL — AB (ref <0.031)
Troponin I: 1.03 ng/mL (ref <0.031)

## 2015-07-05 LAB — CBC
HEMATOCRIT: 27.6 % — AB (ref 39.0–52.0)
HEMOGLOBIN: 8.9 g/dL — AB (ref 13.0–17.0)
MCH: 28.3 pg (ref 26.0–34.0)
MCHC: 32.2 g/dL (ref 30.0–36.0)
MCV: 87.9 fL (ref 78.0–100.0)
Platelets: 182 10*3/uL (ref 150–400)
RBC: 3.14 MIL/uL — AB (ref 4.22–5.81)
RDW: 15.1 % (ref 11.5–15.5)
WBC: 9.2 10*3/uL (ref 4.0–10.5)

## 2015-07-05 LAB — LIPID PANEL
Cholesterol: 202 mg/dL — ABNORMAL HIGH (ref 0–200)
HDL: 48 mg/dL (ref >40)
LDL Cholesterol: 114 mg/dL — ABNORMAL HIGH (ref 0–99)
TRIGLYCERIDES: 202 mg/dL — AB (ref <150)
Total CHOL/HDL Ratio: 4.2 RATIO
VLDL: 40 mg/dL (ref 0–40)

## 2015-07-05 LAB — HEPARIN LEVEL (UNFRACTIONATED)
HEPARIN UNFRACTIONATED: 0.28 [IU]/mL — AB (ref 0.30–0.70)
Heparin Unfractionated: <0.1 IU/mL — ABNORMAL LOW (ref 0.30–0.70)
Heparin Unfractionated: <0.1 IU/mL — ABNORMAL LOW (ref 0.30–0.70)

## 2015-07-05 LAB — HIV ANTIBODY (ROUTINE TESTING W REFLEX): HIV SCREEN 4TH GENERATION: NONREACTIVE

## 2015-07-05 MED ORDER — METOPROLOL TARTRATE 12.5 MG HALF TABLET
12.5000 mg | ORAL_TABLET | Freq: Two times a day (BID) | ORAL | Status: DC
Start: 2015-07-05 — End: 2015-07-06
  Administered 2015-07-05 – 2015-07-06 (×2): 12.5 mg via ORAL
  Filled 2015-07-05 (×2): qty 1

## 2015-07-05 MED ORDER — HEPARIN BOLUS VIA INFUSION
3000.0000 [IU] | Freq: Once | INTRAVENOUS | Status: AC
  Administered 2015-07-05: 3000 [IU] via INTRAVENOUS
  Filled 2015-07-05: qty 3000

## 2015-07-05 MED ORDER — HEPARIN BOLUS VIA INFUSION
2000.0000 [IU] | Freq: Once | INTRAVENOUS | Status: AC
  Administered 2015-07-05: 2000 [IU] via INTRAVENOUS
  Filled 2015-07-05: qty 2000

## 2015-07-05 MED ORDER — DEXTROSE 5 % IV SOLN
2.0000 g | Freq: Once | INTRAVENOUS | Status: DC
  Filled 2015-07-05: qty 2

## 2015-07-05 MED ORDER — VANCOMYCIN HCL IN DEXTROSE 1-5 GM/200ML-% IV SOLN
1000.0000 mg | Freq: Once | INTRAVENOUS | Status: DC
  Filled 2015-07-05: qty 200

## 2015-07-05 MED ORDER — ATORVASTATIN CALCIUM 40 MG PO TABS
40.0000 mg | ORAL_TABLET | Freq: Every day | ORAL | Status: DC
Start: 2015-07-05 — End: 2015-07-06
  Filled 2015-07-05: qty 1

## 2015-07-05 NOTE — Progress Notes (Signed)
Pharmacy Antibiotic Note  Matthew Kline is a 55 y.o. male admitted on 07/04/2015 with pneumonia.  Pharmacy has been consulted for Vancomycin and Cefepime dosing.  ESRD --> normal schedule MWF, missed session Wednesday. Last HD session 6/24 (still going).   Plan: - Vancomycin 1g IV QHD  - Cefepime 2g IV QHD  - F/U HD schedule and further (scheduled) abx doses   Follow up HD schedule  Height: 5\' 11"  (180.3 cm) Weight: 203 lb 11.3 oz (92.4 kg) IBW/kg (Calculated) : 75.3  Temp (24hrs), Avg:98.9 F (37.2 C), Min:98 F (36.7 C), Max:100.3 F (37.9 C)   Recent Labs Lab 07/04/15 1456 07/05/15 0248  WBC 12.0* 9.2  CREATININE 11.28*  --     Estimated Creatinine Clearance: 8.5 mL/min (by C-G formula based on Cr of 11.28).    Allergies  Allergen Reactions  . Morphine And Related Other (See Comments)    hallucinations  . Tygacil [Tigecycline] Nausea And Vomiting and Other (See Comments)    Makes him feel crazy   6/23 MRSA PCR>neg  6/23 BCx x2>>sent  Susa Bones C. Lennox Grumbles, PharmD Pharmacy Resident  Pager: [phone] 07/05/2015 11:05 AM

## 2015-07-05 NOTE — Progress Notes (Addendum)
Pt returned to room 6E10 from HD via bed.

## 2015-07-05 NOTE — Progress Notes (Signed)
Nutrition Brief Note  Patient identified on the Malnutrition Screening Tool (MST) Report  Wt Readings from Last 15 Encounters:  07/05/15 203 lb 11.3 oz (92.4 kg)  04/16/15 203 lb 4.8 oz (92.216 kg)  01/08/15 205 lb 9.6 oz (93.26 kg)  10/28/14 189 lb 13.1 oz (86.1 kg)  10/09/14 196 lb 3.2 oz (88.996 kg)  10/07/14 197 lb 1.6 oz (89.404 kg)  09/06/14 199 lb 12.8 oz (90.629 kg)  09/01/14 190 lb (86.183 kg)  08/28/14 195 lb 3.2 oz (88.542 kg)  08/12/14 194 lb 14.2 oz (88.4 kg)  07/24/14 198 lb 12.8 oz (90.175 kg)  07/14/14 192 lb 3.9 oz (87.2 kg)  07/12/14 205 lb (92.987 kg)  06/26/14 203 lb 6.4 oz (92.262 kg)  06/08/14 195 lb 1.7 oz (88.5 kg)    Body mass index is 28.42 kg/(m^2). Patient meets criteria for Overweight based on current BMI.   Current diet order is Renal, patient ate 50% of his breakfast. He was in HD for lunch.   Spoke with Pt during HD. He does not believe he has lost weight. Rather he believe he has gained weight. He reports his usual dry weight as 88.5kg. He was admitted at 91.7 kg which says was taken "mid dialysis".  At home, he says he eats fairly well. His appetite has "good days and bad days". He does not like oral nutrition supplements and believes he is eating fine now. He declined any addition of supplements.   Wt stable and ate 50% of the meal he was present for.   RD was consulted regarding patients dislike for supplements. This is not an appropriate reason for consult. Patient should not receive supplements unless they are placed based on MST score. If they are ordered per that protocol, he is welcome to decline them.   No nutrition interventions warranted at this time. If nutrition issues arise, please consult RD.   Burtis Junes RD, LDN, CNSC Clinical Nutrition Pager: [phone] 07/05/2015 1:57 PM

## 2015-07-05 NOTE — Progress Notes (Signed)
ANTICOAGULATION CONSULT NOTE - Follow Up Consult  Pharmacy Consult for Heparin  Indication: chest pain/ACS  Allergies  Allergen Reactions  . Morphine And Related Other (See Comments)    hallucinations  . Tygacil [Tigecycline] Nausea And Vomiting and Other (See Comments)    Makes him feel crazy    Patient Measurements: Height: 5\' 11"  (180.3 cm) Weight: 202 lb 4.8 oz (91.763 kg) IBW/kg (Calculated) : 75.3  Vital Signs: Temp: 100.3 F (37.9 C) (06/23 2259) Temp Source: Oral (06/23 2230) BP: 156/82 mmHg (06/23 2259) Pulse Rate: 107 (06/23 2259)  Labs:  Recent Labs  07/04/15 1456 07/04/15 1850 07/05/15 0003 07/05/15 0036  HGB 9.3*  --   --   --   HCT 29.4*  --   --   --   PLT 214  --   --   --   HEPARINUNFRC  --   --   --  0.28*  CREATININE 11.28*  --   --   --   TROPONINI  --  0.85* 1.02*  --     Estimated Creatinine Clearance: 8.5 mL/min (by C-G formula based on Cr of 11.28).  Assessment: Heparin level drawn only 1.5 hours after drip was started, delay due to pt being in hemo  Goal of Therapy:  Heparin level 0.3-0.7 units/ml Monitor platelets by anticoagulation protocol: Yes   Plan:  -Cont heparin as ordered -Check HL about 8 hours after heparin started  Narda Bonds 07/05/2015,3:07 AM

## 2015-07-05 NOTE — Progress Notes (Signed)
Patient arrived to unit per bed.  Reviewed treatment plan and this RN agrees.  Report received from bedside RN, Ellard Artis.  Consent verified.  Patient A & O X 4. Lung sounds diminished, crackles to ausculation in all fields. Generalized edema. Cardiac: NSR.  Prepped LUAVF with alcohol and cannulated with two 15 gauge needles.  Pulsation of blood noted.  Flushed access well with saline per protocol.  Connected and secured lines and initiated tx at 1031.  UF goal of 4500 mL and net fluid removal of 4000 mL.  Will continue to monitor.

## 2015-07-05 NOTE — Progress Notes (Signed)
ANTICOAGULATION CONSULT NOTE - Follow Up Consult  Pharmacy Consult for Heparin Indication: chest pain/ACS  Allergies  Allergen Reactions  . Morphine And Related Other (See Comments)    hallucinations  . Tygacil [Tigecycline] Nausea And Vomiting and Other (See Comments)    Makes him feel crazy    Patient Measurements: Height: 5\' 11"  (180.3 cm) Weight: 187 lb (84.823 kg) IBW/kg (Calculated) : 75.3  Vital Signs: Temp: 98.7 F (37.1 C) (06/24 1948) Temp Source: Oral (06/24 1716) BP: 116/72 mmHg (06/24 1948) Pulse Rate: 92 (06/24 1948)  Labs:  Recent Labs  07/04/15 1456  07/05/15 0036 07/05/15 0248 07/05/15 0922 07/05/15 1030 07/05/15 1538 07/05/15 1904  HGB 9.3*  --   --  8.9*  --   --   --   --   HCT 29.4*  --   --  27.6*  --   --   --   --   PLT 214  --   --  182  --   --   --   --   HEPARINUNFRC  --   --  0.28*  --  <0.10*  --   --  <0.10*  CREATININE 11.28*  --   --   --   --  7.03*  --   --   TROPONINI  --   < >  --  1.03*  --   --  0.77* 0.67*  < > = values in this interval not displayed.  Estimated Creatinine Clearance: 12.5 mL/min (by C-G formula based on Cr of 7.03).   Assessment: Heparin for CP - Hgb 8.9, plts 214>182, no AC PTA  HL remains undetectable this evening. No issues with infusion and RN confirms that labs was drawn correctly.    Goal of Therapy:  Heparin level 0.3-0.7 units/ml Monitor platelets by anticoagulation protocol: Yes   Plan:  - Heparin bolus 2000 units IV x 1 - Increase Heparin gtt to 1750 units/hr - HL in am as already planned along with CBC  Vincenza Hews, PharmD, BCPS 07/05/2015, 8:16 PM Pager: [phone]

## 2015-07-05 NOTE — Procedures (Signed)
I have personally attended this patient's dialysis session.   HD #2 in a row for volume excess L AVF K pending  Jamal Maes, MD [facility] Pager 07/05/2015, 10:47 AM

## 2015-07-05 NOTE — Progress Notes (Signed)
[facility] Progress Note  Patient: Matthew Kline [id number]   PCP: Gildardo Cranker, DO DOB: February 12, 1960   DOA: 07/04/2015   DOS: 07/05/2015   Date of Service: the patient was seen and examined on 07/05/2015  Subjective: Patient complains of chest pain as well as left shoulder pain. This pain is reproducible. Denies any shortness of breath nausea or vomiting. Swelling in the leg is getting better. Nutrition: Tolerating oral diet  Brief hospital course: Pt. with PMH of ESRD, HTN, PMR; admitted on 07/04/2015, with complaint of shortness of breath as well as chest pain, was found to have volume overload due to acute on chronic diastolic dysfunction as well as missing hemodialysis session with elevated troponin. Currently further plan is continue monitoring on telemetry and further cardiac workup.  Assessment and Plan: 1. End stage renal disease ([place])    Acute on chronic diastolic heart failure ([place]).    Acute hypoxic respiratory failure. Volume status improving, 2 back to back. Weight significantly better. Blood pressure improved as well as oxygenation. Continue monitoring on telemetry  2.Elevated troponin Cardiology consult appreciated. Continue heparin, aspirin, lipitor added. TTE tomorrow, stress test tomorrow.  3. Leg edema Vascular duplex  Continue heparin.  4. Accelerated hypertension. Blood pressure was significantly elevated currently getting better after hemodialysis. Low-dose Lopressor added   5. Concern for pneumonia. Patient presented with significant respiratory distress. Initial chest x-ray was concerning for a possible pneumonia and the patient was started on antibiotics. Repeat chest x-ray shows no evidence of infiltrate or pulmonary edema and therefore we will discontinue antibiotics.  Pain management: PRN tylenol Activity: currently independent Bowel regimen: last BM 07/01/2015 Diet: renal diet DVT Prophylaxis: on therapeutic  anticoagulation.  Advance goals of care discussion: full code  Family Communication: no family was present at bedside, at the time of interview.   Disposition:  Discharge to home. Expected discharge date: 07/06/2015.  Consultants: cardiology, nephrology Procedures: HD, Echocardiogram  Antibiotics: Anti-infectives    Start     Dose/Rate Route Frequency Ordered Stop   07/05/15 1800  vancomycin (VANCOCIN) IVPB 1000 mg/200 mL premix     1,000 mg 200 mL/hr over 60 Minutes Intravenous  Once 07/05/15 1102     07/05/15 1800  ceFEPIme (MAXIPIME) 2 g in dextrose 5 % 50 mL IVPB     2 g 100 mL/hr over 30 Minutes Intravenous  Once 07/05/15 1102     07/04/15 2000  vancomycin (VANCOCIN) 2,000 mg in sodium chloride 0.9 % 250 mL IVPB     2,000 mg 250 mL/hr over 60 Minutes Intravenous  Once 07/04/15 1710 07/04/15 2215   07/04/15 2000  ceFEPIme (MAXIPIME) 2 g in dextrose 5 % 50 mL IVPB    Comments:  GIVE AFTER HEMODIALYSIS   2 g 100 mL/hr over 30 Minutes Intravenous  Once 07/04/15 1710 07/04/15 2145   07/04/15 1700  ceFEPIme (MAXIPIME) 1 g in dextrose 5 % 50 mL IVPB  Status:  Discontinued     1 g 100 mL/hr over 30 Minutes Intravenous Every 8 hours 07/04/15 1658 07/04/15 1701        Intake/Output Summary (Last 24 hours) at 07/05/15 1723 Last data filed at 07/05/15 1658  Gross per 24 hour  Intake 445.95 ml  Output   8005 ml  Net -7559.05 ml   Filed Weights   07/04/15 2259 07/05/15 1022 07/05/15 1435  Weight: 91.763 kg (202 lb 4.8 oz) 92.4 kg (203 lb 11.3 oz) 87.7 kg (193 lb 5.5 oz)    Objective:  Physical Exam: Filed Vitals:   07/05/15 1430 07/05/15 1435 07/05/15 1517 07/05/15 1716  BP: 110/67 121/65 136/72 136/73  Pulse: 90 86 86 92  Temp:  98.1 F (36.7 C) 97.8 F (36.6 C) 98.4 F (36.9 C)  TempSrc:  Oral Oral Oral  Resp:  16 16 16   Height:      Weight:  87.7 kg (193 lb 5.5 oz)    SpO2:   97% 100%    General: Alert, Awake and Oriented to Time, Place and Person. Appear in  mild distress Eyes: PERRL, Conjunctiva normal ENT: Oral Mucosa clear moist. Neck: no JVD, no Abnormal Mass Or lumps Cardiovascular: S1 and S2 Present, no Murmur, Respiratory: Bilateral Air entry equal and Decreased, bilateral Crackles, no wheezes Abdomen: Bowel Sound present, Soft and no tenderness Skin: no redness, no Rash  Extremities: no Pedal edema, no calf tenderness Neurologic: Grossly no focal neuro deficit. Bilaterally Equal motor strength.  Data Reviewed: CBC:  Recent Labs Lab 07/04/15 1456 07/05/15 0248  WBC 12.0* 9.2  NEUTROABS 8.9*  --   HGB 9.3* 8.9*  HCT 29.4* 27.6*  MCV 87.8 87.9  PLT 214 [id number]   Basic Metabolic Panel:  Recent Labs Lab 07/04/15 1456 07/05/15 1030  NA 136 133*  K 5.7* 4.5  CL 99* 96*  CO2 23 27  GLUCOSE 136* 146*  BUN 110* 52*  CREATININE 11.28* 7.03*  CALCIUM 9.4 8.8*  PHOS  --  6.2*    Liver Function Tests:  Recent Labs Lab 07/05/15 1030  ALBUMIN 2.7*   No results for input(s): LIPASE, AMYLASE in the last 168 hours. No results for input(s): AMMONIA in the last 168 hours. Coagulation Profile: No results for input(s): INR, PROTIME in the last 168 hours. Cardiac Enzymes:  Recent Labs Lab 07/04/15 1850 07/05/15 0003 07/05/15 0248 07/05/15 1538  TROPONINI 0.85* 1.02* 1.03* 0.77*   BNP (last 3 results) No results for input(s): PROBNP in the last 8760 hours.  CBG: No results for input(s): GLUCAP in the last 168 hours.  Studies: Dg Chest 2 View  07/05/2015  CLINICAL DATA:  Follow-up infiltrate or edema EXAM: CHEST  2 VIEW COMPARISON:  07/04/2015 FINDINGS: Cardiomediastinal silhouette is stable. Improvement in aeration without evidence of confluent infiltrate or pulmonary edema. Mild residual basilar atelectasis. No new infiltrate. Mild degenerative changes mid and lower thoracic spine. IMPRESSION: Improvement in aeration without evidence of confluent infiltrate or pulmonary edema. Mild residual basilar atelectasis. No new  infiltrate. Electronically Signed   By: Lahoma Crocker M.D.   On: 07/05/2015 16:28     Scheduled Meds: . aspirin EC  325 mg Oral Daily  . atorvastatin  40 mg Oral q1800  . ceFEPime (MAXIPIME) IV  2 g Intravenous Once  . [START ON 07/10/2015] darbepoetin (ARANESP) injection - DIALYSIS  100 mcg Intravenous Q Thu-HD  . doxercalciferol  6 mcg Intravenous Q M,W,F-HD  . fluticasone  2 spray Each Nare Daily  . montelukast  10 mg Oral QHS  . multivitamin  1 tablet Oral QHS  . sevelamer carbonate  2,400 mg Oral TID WC  . vancomycin  1,000 mg Intravenous Once   Continuous Infusions: . heparin 1,350 Units/hr (07/05/15 1122)   PRN Meds: acetaminophen, albuterol, ondansetron (ZOFRAN) IV, temazepam  Time spent: 30 minutes  Author: Berle Mull, MD [facility] Pager: [phone] 07/05/2015 5:23 PM  If 7PM-7AM, please contact night-coverage at [email or web address], password [facility]

## 2015-07-05 NOTE — Plan of Care (Signed)
Problem: Fluid Volume: Goal: Compliance with measures to maintain balanced fluid volume will improve Outcome: Progressing Patient to have hemodialysis again today.

## 2015-07-05 NOTE — Progress Notes (Signed)
ANTICOAGULATION CONSULT NOTE - Follow Up Consult  Pharmacy Consult for Heparin Indication: chest pain/ACS  Allergies  Allergen Reactions  . Morphine And Related Other (See Comments)    hallucinations  . Tygacil [Tigecycline] Nausea And Vomiting and Other (See Comments)    Makes him feel crazy    Patient Measurements: Height: 5\' 11"  (180.3 cm) Weight: 203 lb 11.3 oz (92.4 kg) IBW/kg (Calculated) : 75.3  Vital Signs: Temp: 98 F (36.7 C) (06/24 1022) Temp Source: Oral (06/24 0905) BP: 116/60 mmHg (06/24 1046) Pulse Rate: 87 (06/24 1046)  Labs:  Recent Labs  07/04/15 1456 07/04/15 1850 07/05/15 0003 07/05/15 0036 07/05/15 0248 07/05/15 0922  HGB 9.3*  --   --   --  8.9*  --   HCT 29.4*  --   --   --  27.6*  --   PLT 214  --   --   --  182  --   HEPARINUNFRC  --   --   --  0.28*  --  <0.10*  CREATININE 11.28*  --   --   --   --   --   TROPONINI  --  0.85* 1.02*  --  1.03*  --     Estimated Creatinine Clearance: 8.5 mL/min (by C-G formula based on Cr of 11.28).   Assessment: Heparin for CP - Hgb 8.9, plts 214>182, no AC PTA  AM HL undetectable. Per RN drip was started around midnight and never discontinued. Will bolus and increase rate. CBC stable, no bleeding per RN.   Goal of Therapy:  Heparin level 0.3-0.7 units/ml Monitor platelets by anticoagulation protocol: Yes   Plan:  - Heparin bolus 3000 units IV x 1 - Increase Heparin gtt to 1350 units/hr - Check an 8 hr HL - Daily HL and CBC  Sehar Sedano C. Lennox Grumbles, PharmD Pharmacy Resident  Pager: [phone] 07/05/2015 11:03 AM

## 2015-07-05 NOTE — Progress Notes (Signed)
[facility] Rounding note  Subjective/Interval Events:    Eating brk. Feels better after HD  HD last night   4l uf with wt to 91.7  ( edw  88.5) For another dialysis today for additional volume removal Feels less SOB Still with some vague "funny feeling in my chest"  Objective Vital signs in last 24 hours: Filed Vitals:   07/04/15 2230 07/04/15 2259 07/05/15 0534 07/05/15 0905  BP: 164/88 156/82 127/76 136/80  Pulse: 106 107 93 107  Temp: 99 F (37.2 C) 100.3 F (37.9 C) 98.5 F (36.9 C) 98 F (36.7 C)  TempSrc: Oral   Oral  Resp: 21 20 21 20   Height:      Weight: 91.7 kg (202 lb 2.6 oz) 91.763 kg (202 lb 4.8 oz)    SpO2: 98% 92% 90% 92%   Weight change:   Physical Exam: General: alert NAD   Heart: RRR soft 1/6 sem , no rub appreciated  Lungs:  Rare L base crackle otherwise CTA to my exam has crackles about 1/2 up both lung fields posteriorly Abdomen: Obese, NT , ND , BS present.  Extremities: L knee healed surgical scars and that knee much larger than the R but not hot or tender Edema improved with hd last night now 1+R/2+L  Skin: warm dry / no overt rash and no pedal ulcers Neuro: Alert/ OX3, /  No  myoclonus Dialysis Access: LUA AVF pos bruit  Myoclonus has resolved   Labs: Basic Metabolic Panel:  Recent Labs Lab 07/04/15 1456  NA 136  K 5.7*  CL 99*  CO2 23  GLUCOSE 136*  BUN 110*  CREATININE 11.28*  CALCIUM 9.4    CBC:  Recent Labs Lab 07/04/15 1456 07/05/15 0248  WBC 12.0* 9.2  NEUTROABS 8.9*  --   HGB 9.3* 8.9*  HCT 29.4* 27.6*  MCV 87.8 87.9  PLT 214 182   Cardiac Enzymes:  Recent Labs Lab 07/04/15 1850 07/05/15 0003 07/05/15 0248  TROPONINI 0.85* 1.02* 1.03*     Medications: . heparin 1,100 Units/hr (07/04/15 2331)   . aspirin EC  325 mg Oral Daily  . [START ON 07/10/2015] darbepoetin (ARANESP) injection - DIALYSIS  100 mcg Intravenous Q Thu-HD  . doxercalciferol  6 mcg Intravenous Q M,W,F-HD  . fluticasone  2 spray Each Nare  Daily  . montelukast  10 mg Oral QHS  . multivitamin  1 tablet Oral QHS  . sevelamer carbonate  2,400 mg Oral TID WC   OP Dialysis Orders:  Center: [facility] on MWF . EDW 88.5  HD Bath 1.0 k 2.0 Ca  Time 4hrs  Heparin 4000.  Access LUA AVF  BFR 400 DFR 800  Hectorol 6 mcg IV/HD / Mircera 75 mcg q 2 weeks (last given 06/25/15)  Other OP Labs On 6/14 hgb 10.8 Last ca 9.8 phos 7.7 pth 200   Background 55 y.o. male w/ESRD (HD MWF [facility]), HTN, Last HD was [facility] 06/30/15 2 hours only, missed Wed HD (came late and could not wait for HD) presented to the dialysis unit today with 8kg weight goal, low grade fever and chills, developed SSCP radiating down arm - sent by EMS to the ED. Given 2 sl NTG CP better, K 5.7, CXR showing Interstitial and alveolar edema / and initial rroponin mildly positive at 0.57 without acute ECG changes. Admitted for further evaluation and we are asked to provide his HD.   Problem/Plan:  1. ESRD - MWF. Last HD Monday and  only 2 hours. Marked volume overload, 8kg>EDW. HD last night with improvement. Will require back to back treatments given large amount of fluid on board.  HD today  2. CHF/pulm edema - volume overload. See #1 3. Chest pain with + troponins . Has never had ECHO or stress test. Multiple cardiac risk factors. Heparin for ACS. I would favor cardiology evaluation.  4. Hyperkalemia= HD today. Uses 1K bath as outpt. Recheck K pre hd today   5. Low grade fever. Mild leukocytosis. Cefipime for possible HCAP - primary plans to deescalate if post HD CXR neg.  Tmax 100.3 yest  afeb his am  6. Hypertension - Amlodipine 5mg  QHS.  7. Anemia - HGB 9.3.> 8.9 this am  (last given Mircera 06/25/15). Use Aranesp while inpt 8. Metabolic bone disease - Renvela 800  3 with meals and hectorol withhd mwf 9. Nutrition - renal vitamin/ renal diet . Supplement as needed 10. Myoclonus - held gabapentin (takes large dose for ESRD - 600 mg/day and had  accumulation d/t lack of dialysis this past week).  Myoclonus resolved this am   Ernest Haber, PA-C [facility] Beeper [phone] 07/05/2015,9:08 AM  LOS: 1 day   I have seen and examined this patient and agree with plan and assessment in the above note with highlighted additions. Feels better after HD. Still with extra volume on board and for another HD today. Still with some chest discomfort and trops are +.  I would favor cardiology evaluation. Afebrile now and WBC down. Conny Situ B,MD 07/05/2015 10:01 AM

## 2015-07-05 NOTE — Consult Note (Addendum)
CARDIOLOGY CONSULT NOTE   [name] MRN: [id number], DOB/AGE: [birth year]   Admit date: 07/04/2015 Date of Consult: 07/05/2015  Primary Physician: Gildardo Cranker, DO Primary Cardiologist: New patient  Reason for consult:  Chest pain  Problem List  Past Medical History  Diagnosis Date  . Polymyalgia rheumatica ([place])   . Hypertension   . Diabetic neuropathy ([place])   . Osteomyelitis of foot, left, acute ([place])   . Anxiety   . Insomnia, unspecified   . Unspecified vitamin D deficiency   . Anemia, unspecified   . Other chronic postoperative pain   . Allergy   . Unspecified osteomyelitis, site unspecified   . Long term (current) use of anticoagulants   . 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 Ulcer     diabetic foot   . Arthralgia 2010    polyarticular  . Hemorrhoids, internal 10/2011    small  . CHF (congestive heart failure) ([place])   . CHF (congestive heart failure) ([place]) 07/25/2009    denies  . Hemodialysis access site with mature fistula ([place])   . Myocardial infarction ([place]) 1995  . Pneumonia     "probably 4-5 times" (05/09/2014)  . Type II diabetes mellitus ([place]) dx'd 1995  . History of blood transfusion     "related to the anemia"  . GERD (gastroesophageal reflux disease)     hx "before I lost weight"  . Arthritis     "back, knees" (05/09/2014)  . Chronic lower back pain   . Stroke ([place]) 01/10/06    denies residual on 05/09/2014  . Sleep apnea     "lost weight; no more problem" (05/09/2014)  . Coughing     pt. reports that he has drainage from sinus infection  . ESRD (end stage renal disease) on dialysis [facility])     started 12/2012; "MWF; [place] "   . Renal insufficiency   . Unspecified hereditary and idiopathic peripheral neuropathy     feet  . Knee pain, left     Past Surgical History  Procedure Laterality Date  . Amputation  01/21/2012    Procedure: AMPUTATION RAY;  Surgeon: [name], MD;  Location: [place];   Service: Orthopedics;  Laterality: Left;  Left Foot 4th Ray Amputation  . Anterior cervical decomp/discectomy fusion  02/2011  . Knee arthroscopy Left 08-25-2012  . Toe amputation Bilateral     "I've lost 7 toes over the last 7 years" (05/09/2014)  . Refractive surgery Bilateral   . Bascilic vein transposition Left 10/19/2012    Procedure: BASCILIC VEIN TRANSPOSITION;  Surgeon: Serafina Mitchell, MD;  Location: [place];  Service: Vascular;  Laterality: Left;  . Tonsillectomy    . Amputation Left 05/04/2013    Procedure: AMPUTATION DIGIT;  Surgeon: [name], MD;  Location: [place];  Service: Orthopedics;  Laterality: Left;  Left Great Toe Amputation at [facility]  . Toe surgery Left April 2015    Big toe removed on left foot.  . Total knee arthroplasty Left 04/10/2014    Procedure: TOTAL KNEE ARTHROPLASTY;  Surgeon: [name], MD;  Location: [place];  Service: Orthopedics;  Laterality: Left;  . Wound debridement Left 05/09/2014    Dehiscence Left Total Knee Arthroplasty Incision  . Back surgery    . Uvulopalatopharyngoplasty, tonsillectomy and septoplasty  ~ 1989  . I&d extremity Left 05/09/2014    Procedure: Irrigation and Debridement Left Knee and Closure of Total Knee Arthroplasty  Incision;  Surgeon: [name], MD;  Location: [place];  Service: Orthopedics;  Laterality: Left;  . I&d knee with poly exchange Left 05/31/2014    Procedure: IRRIGATION AND DEBRIDEMENT LEFT KNEE, PLACE ANTIBIOTIC BEADS,  POLY EXCHANGE;  Surgeon: [name], MD;  Location: [place];  Service: Orthopedics;  Laterality: Left;  . Excisional total knee arthroplasty with antibiotic spacers Left 08/07/2014    Procedure: Replace Left Total Knee Arthroplasty,  Place Antibiotic Spacer;  Surgeon: [name], MD;  Location: [place];  Service: Orthopedics;  Laterality: Left;  . Total knee revision Left 10/25/2014    Procedure: LEFT TOTAL KNEE REVISION;  Surgeon: [name], MD;  Location: [place];  Service: Orthopedics;  Laterality:  Left;    Allergies  Allergies  Allergen Reactions  . Morphine And Related Other (See Comments)    hallucinations  . Tygacil [Tigecycline] Nausea And Vomiting and Other (See Comments)    Makes him feel crazy    HPI: Matthew Kline is a 55 y.o. male with medical history significant of DM, ESRD, Polymyalgia rheumatica and HTN. He missed dialysis on Wednesday due to having a "PMR" flair and the dialysis center running behind. He then developed worsening shortness of breath, having to sleep upright.He went to dialysis today and then developed substernal chest pain during his dialysis session.He gained 8 lbs in 2 days. He went to dialysis yesterday and then developed substernal chest pain during his dialysis session. Chest pain non-radiating. Worse with deep breaths and coughing. Left leg is tender to palpation.He feels chest heaviness and pressure that eased up after 2 dialysis seasons however is still present, retrosternal. No palpitations or syncope.  He states that prior to this episode he was able to do all ADL at home including cooking, cleaning, he doesn't walk or go out too much.  FH on MIs in multiple uncles in their 41'. He is ongoing smoker. He has never taken statins, in 2014 TG 377, LDL 112.  His DM is now under control on no medication.  Inpatient Medications  . aspirin EC  325 mg Oral Daily  . atorvastatin  40 mg Oral q1800  . ceFEPime (MAXIPIME) IV  2 g Intravenous Once  . [START ON 07/10/2015] darbepoetin (ARANESP) injection - DIALYSIS  100 mcg Intravenous Q Thu-HD  . doxercalciferol  6 mcg Intravenous Q M,W,F-HD  . fluticasone  2 spray Each Nare Daily  . montelukast  10 mg Oral QHS  . multivitamin  1 tablet Oral QHS  . sevelamer carbonate  2,400 mg Oral TID WC  . vancomycin  1,000 mg Intravenous Once   Family History Family History  Problem Relation Age of Onset  . Hypertension Mother   . Cancer Mother 1    Ovarian  . Heart disease Maternal Aunt   .  Stroke Maternal Grandfather     Social History Social History   Social History  . Marital Status: Married    Spouse Name: N/A  . Number of Children: N/A  . Years of Education: N/A   Occupational History  . Not on file.   Social History Main Topics  . Smoking status: Current Every Day Smoker -- 0.12 packs/day for 32 years    Types: Cigarettes  . Smokeless tobacco: Former Systems developer    Quit date: 10/06/2014     Comment: 3 -4 cig daily   . Alcohol Use: No  . Drug Use: No  . Sexual Activity: Yes   Other Topics Concern  .  Not on file   Social History Narrative    Review of Systems  General:  No chills, fever, night sweats or weight changes.  Cardiovascular:  No chest pain, dyspnea on exertion, edema, orthopnea, palpitations, paroxysmal nocturnal dyspnea. Dermatological: No rash, lesions/masses Respiratory: No cough, dyspnea Urologic: No hematuria, dysuria Abdominal:   No nausea, vomiting, diarrhea, bright red blood per rectum, melena, or hematemesis Neurologic:  No visual changes, wkns, changes in mental status. All other systems reviewed and are otherwise negative except as noted above.  Physical Exam  Blood pressure 136/72, pulse 86, temperature 97.8 F (36.6 C), temperature source Oral, resp. rate 16, height 5\' 11"  (1.803 m), weight 193 lb 5.5 oz (87.7 kg), SpO2 97 %.  General: Pleasant, NAD Psych: Normal affect. Neuro: Alert and oriented X 3. Moves all extremities spontaneously. HEENT: Normal  Neck: Supple without bruits or JVD. Lungs:  Resp regular and unlabored, crackles in both bases. Heart: RRR no s3, s4, 2/6 systolic murmurs. Abdomen: Soft, non-tender, non-distended, BS + x 4.  Extremities: No clubbing, cyanosis or edema. DP/PT/Radials 2+ and equal bilaterally. Fistula on the left arm  Labs  Recent Labs  07/04/15 1850 07/05/15 0003 07/05/15 0248  TROPONINI 0.85* 1.02* 1.03*   Lab Results  Component Value Date   WBC 9.2 07/05/2015   HGB 8.9* 07/05/2015     HCT 27.6* 07/05/2015   MCV 87.9 07/05/2015   PLT 182 07/05/2015    Recent Labs Lab 07/05/15 1030  NA 133*  K 4.5  CL 96*  CO2 27  BUN 52*  CREATININE 7.03*  CALCIUM 8.8*  GLUCOSE 146*   Lab Results  Component Value Date   CHOL 228* 09/15/2012   HDL 40 09/15/2012   LDLCALC 113* 09/15/2012   TRIG 377* 09/15/2012   Radiology/Studies  Dg Chest 2 View  07/04/2015  CLINICAL DATA:  Two days of worsening shortness of breath, history of CHF, chronic renal insufficiency with a on dialysis, smoker EXAM: CHEST  2 VIEW COMPARISON:  PA and lateral chest x-ray of October 07, 2014. FINDINGS: The lungs are adequately inflated. The interstitial markings remain increased bilaterally. The cardiac silhouette is enlarged. The pulmonary vascularity is mildly engorged. There are confluent alveolar opacities in both lower lungs. There is no pleural effusion. The patient has undergone previous lower anterior cervical fusion. IMPRESSION: Findings worrisome for CHF with interstitial and alveolar edema. Less likely would be pneumonia.   Echocardiogram - none  ECG: SR, STD 1 mm in lead V6   ASSESSMENT AND PLAN  1. Acute CHF - sec to missed HD and fluid overload, he is still fluid overloaded with crackles on his lungs, I would recommend to remove more fluids at the next HD. We will order echo to evaluate systolic and diastolic function  2. Chest pain - atypical, most probably sec to CHF, however risk factors including DM, HLP, HTN, FH of CAD, I would order a Lexiscan nuclear stress test for tomorrow. He had troponin elevation - possibly sec to CHF and CKD stage 4.  3. HLP - repeat lipids, treat appropriately, the patient is hesitant to take statins   Signed, Ena Dawley, MD, [facility] 07/05/2015, 3:59 PM

## 2015-07-06 ENCOUNTER — Inpatient Hospital Stay (HOSPITAL_COMMUNITY): Payer: Self-pay

## 2015-07-06 ENCOUNTER — Inpatient Hospital Stay (HOSPITAL_COMMUNITY): Payer: Commercial Managed Care - HMO

## 2015-07-06 ENCOUNTER — Other Ambulatory Visit (HOSPITAL_COMMUNITY): Payer: Self-pay

## 2015-07-06 ENCOUNTER — Other Ambulatory Visit: Payer: Self-pay | Admitting: Physician Assistant

## 2015-07-06 DIAGNOSIS — E1143 Type 2 diabetes mellitus with diabetic autonomic (poly)neuropathy: Secondary | ICD-10-CM

## 2015-07-06 DIAGNOSIS — R079 Chest pain, unspecified: Secondary | ICD-10-CM

## 2015-07-06 DIAGNOSIS — I5033 Acute on chronic diastolic (congestive) heart failure: Secondary | ICD-10-CM

## 2015-07-06 DIAGNOSIS — E8779 Other fluid overload: Secondary | ICD-10-CM

## 2015-07-06 DIAGNOSIS — R52 Pain, unspecified: Secondary | ICD-10-CM

## 2015-07-06 LAB — BASIC METABOLIC PANEL
Anion gap: 10 (ref 5–15)
BUN: 31 mg/dL — ABNORMAL HIGH (ref 6–20)
CHLORIDE: 99 mmol/L — AB (ref 101–111)
CO2: 27 mmol/L (ref 22–32)
CREATININE: 4.84 mg/dL — AB (ref 0.61–1.24)
Calcium: 9 mg/dL (ref 8.9–10.3)
GFR, EST AFRICAN AMERICAN: 14 mL/min — AB (ref >60)
GFR, EST NON AFRICAN AMERICAN: 12 mL/min — AB (ref >60)
Glucose, Bld: 141 mg/dL — ABNORMAL HIGH (ref 65–99)
Potassium: 4 mmol/L (ref 3.5–5.1)
SODIUM: 136 mmol/L (ref 135–145)

## 2015-07-06 LAB — NM MYOCAR MULTI W/SPECT W/WALL MOTION / EF
Peak HR: 94 {beats}/min
Rest HR: 83 {beats}/min

## 2015-07-06 LAB — LIPID PANEL
CHOL/HDL RATIO: 4.1 ratio
Cholesterol: 180 mg/dL (ref 0–200)
HDL: 44 mg/dL (ref >40)
LDL CALC: 112 mg/dL — AB (ref 0–99)
Triglycerides: 118 mg/dL (ref <150)
VLDL: 24 mg/dL (ref 0–40)

## 2015-07-06 LAB — TROPONIN I: Troponin I: 0.59 ng/mL (ref <0.031)

## 2015-07-06 LAB — CBC
HEMATOCRIT: 30.5 % — AB (ref 39.0–52.0)
HEMOGLOBIN: 9.4 g/dL — AB (ref 13.0–17.0)
MCH: 27.4 pg (ref 26.0–34.0)
MCHC: 30.8 g/dL (ref 30.0–36.0)
MCV: 88.9 fL (ref 78.0–100.0)
Platelets: 196 10*3/uL (ref 150–400)
RBC: 3.43 MIL/uL — ABNORMAL LOW (ref 4.22–5.81)
RDW: 14.9 % (ref 11.5–15.5)
WBC: 6.8 10*3/uL (ref 4.0–10.5)

## 2015-07-06 LAB — HEPATITIS B SURFACE ANTIGEN: Hepatitis B Surface Ag: NEGATIVE

## 2015-07-06 LAB — HEPARIN LEVEL (UNFRACTIONATED): Heparin Unfractionated: 0.16 IU/mL — ABNORMAL LOW (ref 0.30–0.70)

## 2015-07-06 MED ORDER — ATORVASTATIN CALCIUM 10 MG PO TABS: 10.0000 mg | ORAL_TABLET | Freq: Every day | ORAL | Status: DC

## 2015-07-06 MED ORDER — REGADENOSON 0.4 MG/5ML IV SOLN
0.4000 mg | Freq: Once | INTRAVENOUS | Status: AC
  Administered 2015-07-06: 0.4 mg via INTRAVENOUS
  Filled 2015-07-06: qty 5

## 2015-07-06 MED ORDER — METOPROLOL TARTRATE 25 MG PO TABS
25.0000 mg | ORAL_TABLET | Freq: Two times a day (BID) | ORAL | Status: DC
Start: 2015-07-06 — End: 2015-08-05

## 2015-07-06 MED ORDER — HEPARIN BOLUS VIA INFUSION
2000.0000 [IU] | Freq: Once | INTRAVENOUS | Status: AC
  Administered 2015-07-06: 2000 [IU] via INTRAVENOUS
  Filled 2015-07-06: qty 2000

## 2015-07-06 MED ORDER — TECHNETIUM TC 99M TETROFOSMIN IV KIT
10.0000 | PACK | Freq: Once | INTRAVENOUS | Status: AC | PRN
  Administered 2015-07-06: 10 via INTRAVENOUS

## 2015-07-06 MED ORDER — ASPIRIN 81 MG PO TBEC: 81.0000 mg | DELAYED_RELEASE_TABLET | Freq: Every day | ORAL | Status: DC

## 2015-07-06 MED ORDER — REGADENOSON 0.4 MG/5ML IV SOLN
INTRAVENOUS | Status: AC
  Filled 2015-07-06: qty 5

## 2015-07-06 MED ORDER — GABAPENTIN 300 MG PO CAPS: 300.0000 mg | ORAL_CAPSULE | Freq: Every day | ORAL | Status: DC

## 2015-07-06 MED ORDER — METOPROLOL TARTRATE 25 MG PO TABS: 12.5000 mg | ORAL_TABLET | Freq: Two times a day (BID) | ORAL | Status: DC

## 2015-07-06 MED ORDER — TECHNETIUM TC 99M TETROFOSMIN IV KIT
30.0000 | PACK | Freq: Once | INTRAVENOUS | Status: AC | PRN
  Administered 2015-07-06: 30 via INTRAVENOUS

## 2015-07-06 MED ORDER — HEPARIN SODIUM (PORCINE) 5000 UNIT/ML IJ SOLN: 5000.0000 [IU] | Freq: Three times a day (TID) | INTRAMUSCULAR | Status: DC

## 2015-07-06 MED ORDER — ASPIRIN EC 81 MG PO TBEC
81.0000 mg | DELAYED_RELEASE_TABLET | Freq: Every day | ORAL | Status: DC
  Administered 2015-07-06: 81 mg via ORAL
  Filled 2015-07-06: qty 1

## 2015-07-06 NOTE — Progress Notes (Signed)
VASCULAR LAB PRELIMINARY  PRELIMINARY  PRELIMINARY  PRELIMINARY  Left lower extremity venous duplex completed.    Preliminary report:  There is no DVT or SVT noted in the left lower extremity.  There is a partially ruptured Baker's cyst noted in the left popliteal fossa with fluid noted in the proximal calf.   Cherril Hett, RVT 07/06/2015, 11:11 AM

## 2015-07-06 NOTE — Progress Notes (Signed)
ANTICOAGULATION CONSULT NOTE - Follow Up Consult  Pharmacy Consult for Heparin  Indication: chest pain/ACS  Allergies  Allergen Reactions  . Morphine And Related Other (See Comments)    hallucinations  . Tygacil [Tigecycline] Nausea And Vomiting and Other (See Comments)    Makes him feel crazy    Patient Measurements: Height: 5\' 11"  (180.3 cm) Weight: 187 lb (84.823 kg) IBW/kg (Calculated) : 75.3  Vital Signs: Temp: 98.7 F (37.1 C) (06/24 1948) Temp Source: Oral (06/24 1948) BP: 116/72 mmHg (06/24 1948) Pulse Rate: 92 (06/24 1948)  Labs:  Recent Labs  07/04/15 1456  07/05/15 0248 07/05/15 0922 07/05/15 1030 07/05/15 1538 07/05/15 1904 07/06/15 0124  HGB 9.3*  --  8.9*  --   --   --   --  9.4*  HCT 29.4*  --  27.6*  --   --   --   --  30.5*  PLT 214  --  182  --   --   --   --  196  HEPARINUNFRC  --   < >  --  <0.10*  --   --  <0.10* 0.16*  CREATININE 11.28*  --   --   --  7.03*  --   --  4.84*  TROPONINI  --   < > 1.03*  --   --  0.77* 0.67* 0.59*  < > = values in this interval not displayed.  Estimated Creatinine Clearance: 18.2 mL/min (by C-G formula based on Cr of 4.84).  Assessment: On heparin for elevated troponin, HL trending up but still sub-therapeutic   Goal of Therapy:  Heparin level 0.3-0.7 units/ml Monitor platelets by anticoagulation protocol: Yes   Plan:  -Heparin 2000 units BOLUS -Increase heparin to 2000 units/hr -1100 HL  Matthew Kline 07/06/2015,2:45 AM

## 2015-07-06 NOTE — Discharge Summary (Signed)
[facility] Discharge Summary   Patient: Matthew Kline [id number]   PCP: Gildardo Cranker, DO DOB: 30-Jul-1960   Date of admission: 07/04/2015   Date of discharge: 07/06/2015     Discharge Diagnoses:  Principal Problem:   End stage renal disease ([place]) Active Problems:   Peripheral autonomic neuropathy due to DM (HCC)   Hyperkalemia   Polymyalgia rheumatica (HCC)   GERD (gastroesophageal reflux disease)   Leg edema, left   Anemia in chronic kidney disease   Volume overload   Shortness of breath   Elevated troponin   Acute on chronic diastolic heart failure (HCC)   Pain in the chest  Admitted From: Home Disposition:  Home with family  Recommendations for Outpatient Follow-up:  1. Please follow-up with PCP in one week. 2. Please follow-up with cardiology to get echocardiogram.   Follow-up Information    Follow up with Gildardo Cranker, DO. Schedule an appointment as soon as possible for a visit in 1 week.   Specialty:  Internal Medicine   Contact information:   [place] [address] [phone]      Diet recommendation: Renal diet  Activity: The patient is advised to gradually reintroduce usual activities.  Discharge Condition: good  Code Status: Full code  History of present illness: As per the H and P dictated on admission, "Matthew Kline is a 55 y.o. male with medical history significant of ESRD, PMR, and HTN. He missed dialysis on Wednesday due to having a "PMR" flair and the dialysis center running behind. He then developed worsening shortness of breath, having to sleep upright. He went to dialysis today and then developed substernal chest pain during his dialysis session. Chest pain non-radiating. Worse with deep breaths and coughing. Left leg is tender to palpation.  He reports being 8 kg heavier at dialysis today.  Reports chills and "low" grade fevers and cough"  Hospital Course:  Summary of his active problems in the  hospital is as following. 1. End stage renal disease ([place])  Acute on chronic diastolic heart failure ([place]).  Acute hypoxic respiratory failure. Volume status improving, with hemodialysis. Weight significantly better. Blood pressure improved as well as oxygenation. No acute events on telemetry, patient will resume hemodialysis starting Monday.  2.Elevated troponin Cardiology consult appreciated. Initially treated with therapeutic heparin and aspirin, lipitor added. Negative nuclear medicine stress test for any acute reversible ischemia. 48% EF and cartilage recommended outpatient echocardiogram for follow-up. We will discharge the patient on 81 mg aspirin 10 mg Lipitor and 25 mg Lopressor twice a day. Patient will be contacted by cardiology for follow-up.  3. Leg edema Vascular duplex negative for any DVT or SVT Discontinue heparin.  4. Accelerated hypertension. Blood pressure was significantly elevated currently getting better after hemodialysis. Low-dose Lopressor added   5. Concern for pneumonia. Patient presented with significant respiratory distress. Initial chest x-ray was concerning for a possible pneumonia and the patient was started on antibiotics. Repeat chest x-ray shows no evidence of infiltrate or pulmonary edema and therefore we will discontinue antibiotics.  All other chronic medical condition were stable during the hospitalization.  Patient was ambulatory without any assistance. On the day of the discharge the patient's vitals are stable both on rest as well as on exertion, and no other acute medical condition were reported by patient. the patient was felt safe to be discharge at home with family.  Procedures and Results:  Hemodialysis  Nuclear medicine stress test   Consultations:  Cardiology  Nephrology  DISCHARGE MEDICATION: Discharge Medication List as of 07/06/2015  1:13 PM    START taking these medications   Details  atorvastatin (LIPITOR) 10  MG tablet Take 1 tablet (10 mg total) by mouth daily at 6 PM., Starting 07/06/2015, Until Discontinued, Normal      CONTINUE these medications which have CHANGED   Details  aspirin EC 81 MG EC tablet Take 1 tablet (81 mg total) by mouth daily., Starting 07/06/2015, Until Discontinued, Normal    gabapentin (NEURONTIN) 300 MG capsule Take 1 capsule (300 mg total) by mouth at bedtime., Starting 07/06/2015, Until Discontinued, Normal    metoprolol tartrate (LOPRESSOR) 25 MG tablet Take 1 tablet (25 mg total) by mouth 2 (two) times daily., Starting 07/06/2015, Until Discontinued, Normal      CONTINUE these medications which have NOT CHANGED   Details  albuterol (PROVENTIL HFA;VENTOLIN HFA) 108 (90 Base) MCG/ACT inhaler Inhale 2 puffs into the lungs every 6 (six) hours as needed for wheezing or shortness of breath., Starting 01/08/2015, Until Discontinued, Normal    Cholecalciferol (VITAMIN D PO) Take 1 tablet by mouth every Monday, Wednesday, and Friday. Take at dialysis, Until Discontinued, Historical Med    fluticasone (FLONASE) 50 MCG/ACT nasal spray Place 2 sprays into both nostrils daily., Starting 10/09/2014, Until Discontinued, Normal    montelukast (SINGULAIR) 10 MG tablet TAKE 1 TABLET (10 MG TOTAL) BY MOUTH AT BEDTIME., Normal    sevelamer carbonate (RENVELA) 800 MG tablet Take 800-3,200 mg by mouth 3 (three) times daily with meals. 800 mg-1,600 mg with snacks (depends on dietary intake), Until Discontinued, Historical Med    sorbitol 70 % solution Take 30-60 mLs by mouth daily as needed (for constipation). , Starting 06/04/2015, Until Discontinued, Historical Med    temazepam (RESTORIL) 15 MG capsule Take 15 mg by mouth at bedtime as needed for sleep. , Starting 05/09/2015, Until Discontinued, Historical Med      STOP taking these medications     amLODipine (NORVASC) 5 MG tablet      zolpidem (AMBIEN) 10 MG tablet        Allergies  Allergen Reactions  . Morphine And Related  Other (See Comments)    hallucinations  . Tygacil [Tigecycline] Nausea And Vomiting and Other (See Comments)    Makes him feel crazy   Discharge Instructions    Diet renal [id number]    Complete by:  As directed      Discharge instructions    Complete by:  As directed   It is important that you read following instructions as well as go over your medication list with RN to help you understand your care after this hospitalization.  Discharge Instructions: Please follow-up with PCP in one week Follow up with cardiology for echocardiogram.  Please request your primary care physician to go over all Hospital Tests and Procedure/Radiological results at the follow up,  Please get all Hospital records sent to your PCP by signing hospital release before you go home.   Do not take more than prescribed Pain, Sleep and Anxiety Medications. You were cared for by a hospitalist during your hospital stay. If you have any questions about your discharge medications or the care you received while you were in the hospital after you are discharged, you can call the unit and ask to speak with the hospitalist on call if the hospitalist that took care of you is not available.  Once you are discharged, your primary care physician will handle any further medical issues. Please note  that NO REFILLS for any discharge medications will be authorized once you are discharged, as it is imperative that you return to your primary care physician (or establish a relationship with a primary care physician if you do not have one) for your aftercare needs so that they can reassess your need for medications and monitor your lab values. You Must read complete instructions/literature along with all the possible adverse reactions/side effects for all the Medicines you take and that have been prescribed to you. Take any new Medicines after you have completely understood and accept all the possible adverse reactions/side effects. Wear  Seat belts while driving. If you have smoked or chewed Tobacco in the last 2 yrs please stop smoking and/or stop any Recreational drug use.     Increase activity slowly    Complete by:  As directed           Discharge Exam: Filed Weights   07/05/15 1022 07/05/15 1435 07/05/15 1948  Weight: 92.4 kg (203 lb 11.3 oz) 87.7 kg (193 lb 5.5 oz) 84.823 kg (187 lb)   Filed Vitals:   07/06/15 1001 07/06/15 1146  BP:  166/81  Pulse: 90 91  Temp:  98.2 F (36.8 C)  Resp:  16   General: Appear in no distress, no Rash; Oral Mucosa moist. Cardiovascular: S1 and S2 Present, no Murmur, no JVD Respiratory: Bilateral Air entry present and Clear to Auscultation, no Crackles, no wheezes Abdomen: Bowel Sound present, Soft and no tenderness Extremities: no Pedal edema, no calf tenderness Neurology: Grossly no focal neuro deficit.  The results of significant diagnostics from this hospitalization (including imaging, microbiology, ancillary and laboratory) are listed below for reference.    Significant Diagnostic Studies: Dg Chest 2 View  07/05/2015  CLINICAL DATA:  Follow-up infiltrate or edema EXAM: CHEST  2 VIEW COMPARISON:  07/04/2015 FINDINGS: Cardiomediastinal silhouette is stable. Improvement in aeration without evidence of confluent infiltrate or pulmonary edema. Mild residual basilar atelectasis. No new infiltrate. Mild degenerative changes mid and lower thoracic spine. IMPRESSION: Improvement in aeration without evidence of confluent infiltrate or pulmonary edema. Mild residual basilar atelectasis. No new infiltrate. Electronically Signed   By: Lahoma Crocker M.D.   On: 07/05/2015 16:28   Dg Chest 2 View  07/04/2015  CLINICAL DATA:  Two days of worsening shortness of breath, history of CHF, chronic renal insufficiency with a on dialysis, smoker EXAM: CHEST  2 VIEW COMPARISON:  PA and lateral chest x-ray of October 07, 2014. FINDINGS: The lungs are adequately inflated. The interstitial markings  remain increased bilaterally. The cardiac silhouette is enlarged. The pulmonary vascularity is mildly engorged. There are confluent alveolar opacities in both lower lungs. There is no pleural effusion. The patient has undergone previous lower anterior cervical fusion. IMPRESSION: Findings worrisome for CHF with interstitial and alveolar edema. Less likely would be pneumonia. Electronically Signed   By: David  Martinique M.D.   On: 07/04/2015 14:18   Nm Myocar Multi W/spect W/wall Motion / Ef  07/06/2015  CLINICAL DATA:  Shortness of breath, CHF and elevated troponin. EXAM: MYOCARDIAL IMAGING WITH SPECT (REST AND PHARMACOLOGIC-STRESS) GATED LEFT VENTRICULAR WALL MOTION STUDY LEFT VENTRICULAR EJECTION FRACTION TECHNIQUE: Standard myocardial SPECT imaging was performed after resting intravenous injection of 10 mCi Tc-43m tetrofosmin. Subsequently, intravenous infusion of Lexiscan was performed under the supervision of the Cardiology staff. At peak effect of the drug, 30 mCi Tc-64m tetrofosmin was injected intravenously and standard myocardial SPECT imaging was performed. Quantitative gated imaging was also performed to  evaluate left ventricular wall motion, and estimate left ventricular ejection fraction. COMPARISON:  None. FINDINGS: Perfusion: The left ventricular cavity is mildly dilated. There is some mild attenuation of the inferior wall on both rest and stress acquisitions consistent either with scar or diaphragmatic attenuation. There is no evidence of inducible myocardial ischemia. Wall Motion: Mild inferior wall hypokinesis. Left Ventricular Ejection Fraction: 48 % End diastolic volume 0000000 ml End systolic volume 92 ml IMPRESSION: 1. No evidence of inducible myocardial ischemia. Inferior wall diaphragmatic attenuation versus scar. 2. Mild inferior wall hypokinesis. Mild left ventricular cavity dilatation. 3. Left ventricular ejection fraction 48% 4. Non invasive risk stratification*: Intermediate *2012  Appropriate Use Criteria for Coronary Revascularization Focused Update: J Am Coll Cardiol. [id number]. http://content.airportbarriers.com.aspx?articleid=[email or web address] Electronically Signed   By: Aletta Edouard M.D.   On: 07/06/2015 12:11    Microbiology: Recent Results (from the past 240 hour(s))  MRSA PCR Screening     Status: None   Collection Time: 07/04/15  5:56 PM  Result Value Ref Range Status   MRSA by PCR NEGATIVE NEGATIVE Final    Comment:        The GeneXpert MRSA Assay (FDA approved for NASAL specimens only), is one component of a comprehensive MRSA colonization surveillance program. It is not intended to diagnose MRSA infection nor to guide or monitor treatment for MRSA infections.   Culture, blood (routine x 2) Call MD if unable to obtain prior to antibiotics being given     Status: None (Preliminary result)   Collection Time: 07/04/15  6:15 PM  Result Value Ref Range Status   Specimen Description BLOOD LINE  Final   Special Requests BOTTLES DRAWN AEROBIC AND ANAEROBIC 10CC  Final   Culture NO GROWTH 2 DAYS  Final   Report Status PENDING  Incomplete  Culture, blood (routine x 2) Call MD if unable to obtain prior to antibiotics being given     Status: None (Preliminary result)   Collection Time: 07/04/15  6:25 PM  Result Value Ref Range Status   Specimen Description BLOOD LINE  Final   Special Requests BOTTLES DRAWN AEROBIC AND ANAEROBIC 10CC  Final   Culture NO GROWTH 2 DAYS  Final   Report Status PENDING  Incomplete     Labs: CBC:  Recent Labs Lab 07/04/15 1456 07/05/15 0248 07/06/15 0124  WBC 12.0* 9.2 6.8  NEUTROABS 8.9*  --   --   HGB 9.3* 8.9* 9.4*  HCT 29.4* 27.6* 30.5*  MCV 87.8 87.9 88.9  PLT 214 182 [id number]   Basic Metabolic Panel:  Recent Labs Lab 07/04/15 1456 07/05/15 1030 07/06/15 0124  NA 136 133* 136  K 5.7* 4.5 4.0  CL 99* 96* 99*  CO2 23 27 27   GLUCOSE 136* 146* 141*  BUN 110* 52* 31*  CREATININE 11.28* 7.03* 4.84*    CALCIUM 9.4 8.8* 9.0  PHOS  --  6.2*  --    Liver Function Tests:  Recent Labs Lab 07/05/15 1030  ALBUMIN 2.7*   No results for input(s): LIPASE, AMYLASE in the last 168 hours. No results for input(s): AMMONIA in the last 168 hours. Cardiac Enzymes:  Recent Labs Lab 07/05/15 0003 07/05/15 0248 07/05/15 1538 07/05/15 1904 07/06/15 0124  TROPONINI 1.02* 1.03* 0.77* 0.67* 0.59*   BNP (last 3 results)  Recent Labs  09/01/14 1647 07/04/15 1431  BNP 454.5* 4065.6*   CBG: No results for input(s): GLUCAP in the last 168 hours. Time spent: 30 minutes  Signed:  Jaxzen Vanhorn  [facility] 07/06/2015, 10:37 PM

## 2015-07-06 NOTE — Progress Notes (Signed)
[name], male   DOB: 18-May-1960, 55 y.o.   MRN: [id number]   Patient is off floor for stress test. Initial consult note reviewed, plan as dictated above. Will f/u once stress results complete    Assessment/Plan    1. Acute CHF - in setting of missed HD sessions - echo is pending  2. Chest pain - atypical pain in setting of volume overload - due to CAD risk factors plan for lexiscan today. Peak troponin of 0.77 in setting of ESRD and volume overload of unclear significance, f/u stress results - medical therapy with ASA, atorva, heparin, lopressor - if negative stress from cardiac standpoint ok to stop heparin.       Carlyle Dolly, M.D.

## 2015-07-06 NOTE — Progress Notes (Addendum)
[facility] Rounding note  Subjective/Interval Events   Had HD again yesterday with another 4 liters off Post HD weight 84.4 kg Stress test this AM - says had no CP or SOB Had LE duplex (LLE) says he has ruptured Baker's cyst (formal report NA)  Objective Vital signs in last 24 hours: Filed Vitals:   07/06/15 0956 07/06/15 0958 07/06/15 0959 07/06/15 1001  BP: 174/85 149/79 143/76   Pulse: 87 93 91 90  Temp:      TempSrc:      Resp:      Height:      Weight:      SpO2:       Weight change: 5.9 kg (13 lb 0.1 oz)  Physical Exam:  BP 166/81 mmHg  Pulse 91  Temp(Src) 98.2 F (36.8 C) (Oral)  Resp 16  Ht 5\' 11"  (1.803 m)  Wt 84.823 kg (187 lb)  BMI 26.09 kg/m2  SpO2 100%  Looks great On RA Just back from stress test Lungs few R base crackles only much improved No pericardial rub Abd soft and NT Edema virtually resolved in LE's Left knee larger than R  Labs: Basic Metabolic Panel:  Recent Labs Lab 07/04/15 1456 07/05/15 1030 07/06/15 0124  NA 136 133* 136  K 5.7* 4.5 4.0  CL 99* 96* 99*  CO2 23 27 27   GLUCOSE 136* 146* 141*  BUN 110* 52* 31*  CREATININE 11.28* 7.03* 4.84*  CALCIUM 9.4 8.8* 9.0  PHOS  --  6.2*  --       Recent Labs Lab 07/04/15 1456 07/05/15 0248 07/06/15 0124  WBC 12.0* 9.2 6.8  NEUTROABS 8.9*  --   --   HGB 9.3* 8.9* 9.4*  HCT 29.4* 27.6* 30.5*  MCV 87.8 87.9 88.9  PLT 214 182 196     Recent Labs Lab 07/05/15 0003 07/05/15 0248 07/05/15 1538 07/05/15 1904 07/06/15 0124  TROPONINI 1.02* 1.03* 0.77* 0.67* 0.59*     Medications: . heparin 2,000 Units/hr (07/06/15 0339)   . aspirin EC  81 mg Oral Daily  . atorvastatin  40 mg Oral q1800  . [START ON 07/10/2015] darbepoetin (ARANESP) injection - DIALYSIS  100 mcg Intravenous Q Thu-HD  . doxercalciferol  6 mcg Intravenous Q M,W,F-HD  . fluticasone  2 spray Each Nare Daily  . metoprolol tartrate  12.5 mg Oral BID  . montelukast  10 mg Oral QHS  . multivitamin  1 tablet Oral  QHS  . regadenoson      . sevelamer carbonate  2,400 mg Oral TID WC   OP Dialysis Orders:  Center: [facility] on MWF . EDW 88.5 will lower to 87.5 kg at discharge  HD Bath 1.0 k 2.0 Ca  Time 4hrs  Heparin 4000.  Access LUA AVF  BFR 400 DFR 800  Hectorol 6 mcg IV/HD / Mircera 75 mcg q 2 weeks (last given 06/25/15)  Other OP Labs On 6/14 hgb 10.8 Last ca 9.8 phos 7.7 pth 200   Background 55 y.o. male w/ESRD (HD MWF [facility]), HTN, Last HD was [facility] 06/30/15 2 hours only, missed Wed HD (came late and could not wait for HD) presented to the dialysis unit today with 8kg weight goal, low grade fever and chills, developed SSCP radiating down arm - sent by EMS to the ED. Given 2 sl NTG CP better, K 5.7, CXR showing Interstitial and alveolar edema / and initial troponin mildly positive at 0.57 without acute ECG changes. Admitted for further  evaluation and we were asked to provide his HD.   Problem/Plan:  1. ESRD - MWF. Last HD prior to Friday admission was on Monday and only 2 hours. Marked volume overload, 8kg>EDW at time of admission. Now s/p hD X 2 with 8 liters off and post HD weight yesterday 87.7 kg so just under EDW of 88.5. Next treatment on Monday. New EDW of 87.5 kg  2. CHF/pulm edema - volume overload. See #1. Resolved 3. Chest pain with + troponins . Cards evaluating. Stress test today result pending. Still on heparin. Has never had ECHO but says cards will do as outpt . Multiple cardiac risk factors.  4. Low grade fever. Mild leukocytosis. Cefipime for possible HCAP has been stopped. Afebrile now, WBC down.  5. Hypertension - Amlodipine 5mg  QHS.  6. Anemia - HGB 9.4 (last given Mircera 06/25/15). Use Aranesp while inpt 7. Metabolic bone disease - Renvela 800  3 with meals and hectorol withhd mwf 8. Nutrition - renal vitamin/ renal diet . Supplement as needed 9. Myoclonus - held gabapentin (takes large dose for ESRD - 600 mg/day and had accumulation d/t lack  of dialysis this past week). Myoclonus resolved.  Plan: HD Monday (if still here). Lower EDW. F/U results of stress test. Outpt ECHO. DO NOT restart large dose gabapentin 300 once a day OK)  Matthew Maes, MD [facility] [phone] Pager 07/06/2015, 11:16 AM

## 2015-07-06 NOTE — Progress Notes (Addendum)
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 Tolerated the procedure well.   CURRENT MEDS . aspirin EC  81 mg Oral Daily  . atorvastatin  40 mg Oral q1800  . [START ON 07/10/2015] darbepoetin (ARANESP) injection - DIALYSIS  100 mcg Intravenous Q Thu-HD  . doxercalciferol  6 mcg Intravenous Q M,W,F-HD  . fluticasone  2 spray Each Nare Daily  . metoprolol tartrate  12.5 mg Oral BID  . montelukast  10 mg Oral QHS  . multivitamin  1 tablet Oral QHS  . regadenoson      . sevelamer carbonate  2,400 mg Oral TID WC    OBJECTIVE  Filed Vitals:   07/06/15 0956 07/06/15 0958 07/06/15 0959 07/06/15 1001  BP: 174/85 149/79 143/76   Pulse: 87 93 91 90  Temp:      TempSrc:      Resp:      Height:      Weight:      SpO2:        Intake/Output Summary (Last 24 hours) at 07/06/15 1004 Last data filed at 07/06/15 0942  Gross per 24 hour  Intake 494.63 ml  Output   4005 ml  Net -3510.37 ml   Filed Weights   07/05/15 1022 07/05/15 1435 07/05/15 1948  Weight: 203 lb 11.3 oz (92.4 kg) 193 lb 5.5 oz (87.7 kg) 187 lb (84.823 kg)    PHYSICAL EXAM  General: Pleasant, NAD. Neuro: Alert and oriented X 3. Moves all extremities spontaneously. Psych: Normal affect. HEENT:  Normal  Neck: Supple without bruits or JVD. Lungs:  Resp regular and unlabored, CTA. Heart: RRR no s3, s4, or murmurs. Abdomen: Soft, non-tender, non-distended, BS + x 4.  Extremities: No clubbing, cyanosis or edema. DP/PT/Radials 2+ and equal bilaterally.  Accessory Clinical  Findings  CBC  Recent Labs  07/04/15 1456 07/05/15 0248 07/06/15 0124  WBC 12.0* 9.2 6.8  NEUTROABS 8.9*  --   --   HGB 9.3* 8.9* 9.4*  HCT 29.4* 27.6* 30.5*  MCV 87.8 87.9 88.9  PLT 214 182 [id number]   Basic Metabolic Panel  Recent Labs  07/05/15 1030 07/06/15 0124  NA 133* 136  K 4.5 4.0  CL 96* 99*  CO2 27 27  GLUCOSE 146* 141*  BUN 52* 31*  CREATININE 7.03* 4.84*  CALCIUM 8.8* 9.0  PHOS 6.2*  --    Liver Function Tests  Recent Labs  07/05/15 1030  ALBUMIN 2.7*   No results for input(s): LIPASE, AMYLASE in the last 72 hours. Cardiac Enzymes  Recent Labs  07/05/15 1538 07/05/15 1904 07/06/15 0124  TROPONINI 0.77* 0.67* 0.59*   BNP Invalid input(s): POCBNP D-Dimer No results for input(s): DDIMER in the last 72 hours. Hemoglobin A1C No results for input(s): HGBA1C in the last 72 hours. Fasting Lipid Panel  Recent Labs  07/06/15 0124  CHOL 180  HDL 44  LDLCALC 112*  TRIG 118  CHOLHDL 4.1   Thyroid Function Tests No results for input(s): TSH, T4TOTAL,  T3FREE, THYROIDAB in the last 72 hours.  Invalid input(s): FREET3  TELE  NSR in nuc med  Radiology/Studies  Dg Chest 2 View  07/05/2015  CLINICAL DATA:  Follow-up infiltrate or edema EXAM: CHEST  2 VIEW COMPARISON:  07/04/2015 FINDINGS: Cardiomediastinal silhouette is stable. Improvement in aeration without evidence of confluent infiltrate or pulmonary edema. Mild residual basilar atelectasis. No new infiltrate. Mild degenerative changes mid and lower thoracic spine. IMPRESSION: Improvement in aeration without evidence of confluent infiltrate or pulmonary edema. Mild residual basilar atelectasis. No new infiltrate. Electronically Signed   By: Lahoma Crocker M.D.   On: 07/05/2015 16:28   Dg Chest 2 View  07/04/2015  CLINICAL DATA:  Two days of worsening shortness of breath, history of CHF, chronic renal insufficiency with a on dialysis, smoker EXAM: CHEST  2 VIEW COMPARISON:  PA and lateral chest x-ray  of October 07, 2014. FINDINGS: The lungs are adequately inflated. The interstitial markings remain increased bilaterally. The cardiac silhouette is enlarged. The pulmonary vascularity is mildly engorged. There are confluent alveolar opacities in both lower lungs. There is no pleural effusion. The patient has undergone previous lower anterior cervical fusion. IMPRESSION: Findings worrisome for CHF with interstitial and alveolar edema. Less likely would be pneumonia. Electronically Signed   By: David  Martinique M.D.   On: 07/04/2015 14:18    ASSESSMENT AND PLAN  1. Acute CHF - sec to missed HD and fluid overload, he was felt to still be overloaded with crackles on his lungs and Dr Meda Coffee recommend to remove more fluids at the next HD. Echo to evaluate systolic and diastolic function pending  2. Chest pain - atypical, most probably sec to CHF, however risk factors including DM, HLP, HTN, FH of CAD,  Lexiscan nuclear stress test ordered. This was completed today. We will await nuclear images. He had troponin elevation - possibly sec to CHF and CKD stage 4.  3. HLP - repeat lipids, treat appropriately, the patient is hesitant to take statins  4. HTN- BP has been elevated but he has not gotten his AM meds. Continue Lopresor 12.5mg  BID for now.   Signed, Angelena Form PA-C  Pager [phone]  The patient was seen, examined and discussed with Angelena Form, PA-C and I agree with the above.   The patient underwent a Lexiscan nuclear stress test today, that shows no evidence of prior infarct or ischemia. LVEF mildly decreased calculated at 48%, we will await echocardiogram for final results. The findinga rea consistent with dilated non-ischemic cardiomyopathy.  He remains fluid overloaded and additional fluid removal via HD,  That he has scheduled for tomorrow.  We will follow BP measurements, he is not a good candidate for ACEI/ARB, but I would increase metoprolol to 25 mg po BID and if additional  control needed, hydralazine/imdur would be a good combination for him.   The patient would like to go home, he is stable from cardiac standpoint to be discharged. We will arrange for an outpatient follow up and an outpatient echocardiogram.  Ena Dawley 07/06/2015

## 2015-07-09 LAB — CULTURE, BLOOD (ROUTINE X 2)
CULTURE: NO GROWTH
Culture: NO GROWTH

## 2015-08-05 ENCOUNTER — Other Ambulatory Visit: Payer: Self-pay | Admitting: Internal Medicine

## 2015-08-07 ENCOUNTER — Other Ambulatory Visit (INDEPENDENT_AMBULATORY_CARE_PROVIDER_SITE_OTHER): Payer: Self-pay

## 2015-08-07 DIAGNOSIS — R0989 Other specified symptoms and signs involving the circulatory and respiratory systems: Secondary | ICD-10-CM

## 2015-08-12 ENCOUNTER — Ambulatory Visit: Payer: Self-pay | Admitting: Cardiology

## 2015-08-12 DIAGNOSIS — R0989 Other specified symptoms and signs involving the circulatory and respiratory systems: Secondary | ICD-10-CM

## 2015-08-31 ENCOUNTER — Other Ambulatory Visit: Payer: Self-pay | Admitting: Internal Medicine

## 2015-09-01 ENCOUNTER — Ambulatory Visit: Payer: Self-pay | Admitting: Internal Medicine

## 2015-09-02 ENCOUNTER — Ambulatory Visit: Payer: Self-pay | Admitting: Nurse Practitioner

## 2015-09-03 ENCOUNTER — Telehealth: Payer: Self-pay

## 2015-09-03 DIAGNOSIS — M25562 Pain in left knee: Secondary | ICD-10-CM

## 2015-09-03 MED ORDER — KNEE BRACE MISC: 0 refills | Status: DC

## 2015-09-03 NOTE — Telephone Encounter (Signed)
Patient was seen at [facility] and Junie Panning (NP) recommends a left knee brace for left knee pain. Patient needs an order from his PCP for coverage.   Please advise if ok to provide order

## 2015-09-03 NOTE — Telephone Encounter (Signed)
Ok for knee brace for chronic knee pain

## 2015-09-04 NOTE — Telephone Encounter (Signed)
Order faxed, patient aware

## 2015-09-08 ENCOUNTER — Other Ambulatory Visit: Payer: Self-pay | Admitting: *Deleted

## 2015-09-08 MED ORDER — ATORVASTATIN CALCIUM 10 MG PO TABS: ORAL_TABLET | ORAL | 0 refills | Status: DC

## 2015-09-08 NOTE — Telephone Encounter (Signed)
[facility] 

## 2015-09-10 ENCOUNTER — Other Ambulatory Visit: Payer: Self-pay | Admitting: *Deleted

## 2015-09-10 MED ORDER — ATORVASTATIN CALCIUM 10 MG PO TABS
ORAL_TABLET | ORAL | 0 refills | Status: DC
Start: 2015-09-10 — End: 2016-08-20

## 2015-09-10 NOTE — Telephone Encounter (Signed)
[facility] 

## 2015-09-25 ENCOUNTER — Ambulatory Visit (HOSPITAL_COMMUNITY): Payer: Medicare HMO | Attending: Cardiology

## 2015-09-25 ENCOUNTER — Other Ambulatory Visit: Payer: Self-pay

## 2015-09-25 DIAGNOSIS — I517 Cardiomegaly: Secondary | ICD-10-CM | POA: Diagnosis not present

## 2015-09-25 DIAGNOSIS — R079 Chest pain, unspecified: Secondary | ICD-10-CM | POA: Diagnosis present

## 2015-09-26 ENCOUNTER — Telehealth: Payer: Self-pay | Admitting: *Deleted

## 2015-09-26 NOTE — Progress Notes (Signed)
LVEF has improved, now normal, F/U in 6 months.

## 2015-09-26 NOTE — Telephone Encounter (Signed)
Received fax from [facility] stating that they have received a request to review services for a kidney transplant evaluation by [facility]. Service Code [phone]. The service was APPROVED at network benefit level at a Medicare approved transplant facility accepting Medicare allowable rates for 12 visits beginning 09/23/15-09/21/16. Authorization #: [id number] Member number [id number]

## 2015-10-06 ENCOUNTER — Telehealth: Payer: Self-pay | Admitting: General Practice

## 2015-10-14 ENCOUNTER — Ambulatory Visit: Payer: Medicare HMO | Attending: Family Medicine | Admitting: Physical Therapy

## 2015-10-14 DIAGNOSIS — M25562 Pain in left knee: Secondary | ICD-10-CM | POA: Insufficient documentation

## 2015-10-14 DIAGNOSIS — R262 Difficulty in walking, not elsewhere classified: Secondary | ICD-10-CM | POA: Diagnosis present

## 2015-10-14 DIAGNOSIS — M6281 Muscle weakness (generalized): Secondary | ICD-10-CM | POA: Diagnosis present

## 2015-10-15 NOTE — Addendum Note (Signed)
Addended by: Carney Living on: 10/15/2015 11:20 AM   Modules accepted: Orders
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
## 2015-10-21 ENCOUNTER — Ambulatory Visit: Payer: Medicare HMO | Admitting: Physical Therapy

## 2015-10-23 ENCOUNTER — Ambulatory Visit: Payer: Medicare HMO | Admitting: Physical Therapy

## 2015-10-28 ENCOUNTER — Ambulatory Visit: Payer: Medicare HMO | Admitting: Physical Therapy

## 2015-10-28 ENCOUNTER — Ambulatory Visit (INDEPENDENT_AMBULATORY_CARE_PROVIDER_SITE_OTHER): Payer: Medicare HMO | Admitting: Orthopedic Surgery

## 2015-10-28 DIAGNOSIS — M25562 Pain in left knee: Secondary | ICD-10-CM

## 2015-10-28 DIAGNOSIS — M6281 Muscle weakness (generalized): Secondary | ICD-10-CM

## 2015-10-28 DIAGNOSIS — R262 Difficulty in walking, not elsewhere classified: Secondary | ICD-10-CM

## 2015-10-28 DIAGNOSIS — E11610 Type 2 diabetes mellitus with diabetic neuropathic arthropathy: Secondary | ICD-10-CM

## 2015-10-29 NOTE — Therapy (Addendum)
[place], [place], [address] Phone: [phone]   Fax:  [phone]  Physical Therapy Evaluation  Patient Details  Name: Matthew Kline MRN: [id number] Date of Birth: Aug 17, 1960 Referring Provider: Dr Meridee Score   Encounter Date: 10/28/2015      PT End of Session - 10/29/15 0836    Visit Number 2   Number of Visits 16   Date for PT Re-Evaluation 12/10/15   Authorization Type [employer]   PT Start Time 0930   PT Stop Time 1012   PT Time Calculation (min) 42 min   Activity Tolerance Patient tolerated treatment well   Behavior During Therapy [facility] for tasks assessed/performed      Past Medical History:  Diagnosis Date  . Allergy   . Anemia, unspecified   . Anxiety   . Arthralgia 2010   polyarticular  . Arthritis    "back, knees" (05/09/2014)  . CHF (congestive heart failure) ([place])   . CHF (congestive heart failure) ([place]) 07/25/2009   denies  . Chronic lower back pain   . Coughing    pt. reports that he has drainage from sinus infection  . Diabetic neuropathy ([place])   . ESRD (end stage renal disease) on dialysis [facility])    started 12/2012; "MWF; [place] "   . GERD (gastroesophageal reflux disease)    hx "before I lost weight"  . Hemodialysis access site with mature fistula ([place])   . Hemorrhoids, internal 10/2011   small  . History of blood transfusion    "related to the anemia"  . Hypertension   . Insomnia, unspecified   . Knee pain, left   . Lacunar infarction ([place]) 2006   RUE/RLE, speech  . Long term (current) use of anticoagulants   . Myocardial infarction 1995  . Osteomyelitis of foot, left, acute ([place])   . Other chronic postoperative pain   . Pneumonia    "probably 4-5 times" (05/09/2014)  . Polymyalgia rheumatica ([place])   . Renal insufficiency   . Sleep apnea    "lost weight; no more problem" (05/09/2014)  . Stroke ([place]) 01/10/06   denies residual on 05/09/2014   . Type II diabetes mellitus ([place]) dx'd 1995  . Ulcer ([place])    diabetic foot   . Unspecified hereditary and idiopathic peripheral neuropathy    feet  . Unspecified osteomyelitis, site unspecified ([place])   . Unspecified vitamin D deficiency     Past Surgical History:  Procedure Laterality Date  . AMPUTATION  01/21/2012   Procedure: AMPUTATION RAY;  Surgeon: [name], MD;  Location: [place];  Service: Orthopedics;  Laterality: Left;  Left Foot 4th Ray Amputation  . AMPUTATION Left 05/04/2013   Procedure: AMPUTATION DIGIT;  Surgeon: [name], MD;  Location: [place];  Service: Orthopedics;  Laterality: Left;  Left Great Toe Amputation at [facility]  . ANTERIOR CERVICAL DECOMP/DISCECTOMY FUSION  02/2011  . BACK SURGERY    . BASCILIC VEIN TRANSPOSITION Left 10/19/2012   Procedure: BASCILIC VEIN TRANSPOSITION;  Surgeon: Serafina Mitchell, MD;  Location: [place];  Service: Vascular;  Laterality: Left;  . EXCISIONAL TOTAL KNEE ARTHROPLASTY WITH ANTIBIOTIC SPACERS Left 08/07/2014   Procedure: Replace Left Total Knee Arthroplasty,  Place Antibiotic Spacer;  Surgeon: [name], MD;  Location: [place];  Service: Orthopedics;  Laterality: Left;  . I&D EXTREMITY Left 05/09/2014   Procedure: Irrigation and Debridement Left Knee and Closure of Total Knee Arthroplasty Incision;  Surgeon: [name], MD;  Location: [place];  Service: Orthopedics;  Laterality: Left;  . I&D KNEE WITH POLY EXCHANGE Left 05/31/2014   Procedure: IRRIGATION AND DEBRIDEMENT LEFT KNEE, PLACE ANTIBIOTIC BEADS,  POLY EXCHANGE;  Surgeon: [name], MD;  Location: [place];  Service: Orthopedics;  Laterality: Left;  . KNEE ARTHROSCOPY Left 08-25-2012  . REFRACTIVE SURGERY Bilateral   . TOE AMPUTATION Bilateral    "I've lost 7 toes over the last 7 years" (05/09/2014)  . TOE SURGERY Left April 2015   Big toe removed on left foot.  . TONSILLECTOMY    . TOTAL KNEE ARTHROPLASTY Left 04/10/2014   Procedure: TOTAL KNEE ARTHROPLASTY;  Surgeon:  [name], MD;  Location: [place];  Service: Orthopedics;  Laterality: Left;  . TOTAL KNEE REVISION Left 10/25/2014   Procedure: LEFT TOTAL KNEE REVISION;  Surgeon: [name], MD;  Location: [place];  Service: Orthopedics;  Laterality: Left;  . UVULOPALATOPHARYNGOPLASTY, TONSILLECTOMY AND SEPTOPLASTY  ~ 1989  . WOUND DEBRIDEMENT Left 05/09/2014   Dehiscence Left Total Knee Arthroplasty Incision    There were no vitals filed for this visit.       Subjective Assessment - 10/29/15 0833    Subjective Patient is being seen for his first follow up visit he has been doing his exercises. Hie continues to have significant pain and weakness. He feels like when he does not have the brace his leg buckles on him.    Limitations Walking;Standing   How long can you sit comfortably? No limit    How long can you stand comfortably? 10 minutes to an hour depending on activity level    How long can you walk comfortably? Knee buckles when walking    Diagnostic tests Nothing at this time    Patient Stated Goals To get knee stronger and avoid surgery    Currently in Pain? Yes   Pain Score 6    Pain Location Knee   Pain Orientation Left   Pain Descriptors / Indicators Aching;Shooting;Sharp   Pain Type Acute pain   Pain Onset More than a month ago   Pain Frequency Constant   Aggravating Factors  walking and standing    Pain Relieving Factors rest and ice    Effect of Pain on Daily Activities difficulty perfroming ADL's                        [facility] Adult PT Treatment/Exercise - 10/29/15 0001      Knee/Hip Exercises: Supine   Quad Sets Limitations 2x10    Short Arc Quad Sets Limitations 2x10 in limited range    Straight Leg Raises Limitations 2x10 with extenssor lag      Modalities   Modalities Ultrasound     Ultrasound   Ultrasound Location left knee    Ultrasound Parameters 1.5 w/cm2 continuous    Ultrasound Goals Other (Comment)  To promote tissue healing and reduce  inflammation      Manual Therapy   Manual therapy comments IASTYM to medial knee to improve tissue healing                 PT Education - 10/29/15 0836    Education provided Yes   Education Details Symptom mangement    Person(s) Educated Patient   Methods Explanation   Comprehension Verbalized understanding;Returned demonstration          PT Short Term Goals - 10/15/15 1046  PT SHORT TERM GOAL #1   Title Patient will demsotrate full pain free left knee ROM    Time 4   Period Weeks   Status New     PT SHORT TERM GOAL #2   Title Patient will demsotrate 3+/5 knee extension strength    Time 4   Period Weeks   Status New     PT SHORT TERM GOAL #3   Title Patient will ambulate 300' w/o pain with proper gait pattern    Time 4   Period Weeks   Status New     PT SHORT TERM GOAL #4   Title Patient will be independent with HEP    Time 4   Period Weeks   Status New           PT Long Term Goals - 10/29/15 0840      PT LONG TERM GOAL #1   Title Patient will ambulate 1 mile without increased pain and intability in order to go shopping    Time 8   Period Weeks   Status New     PT LONG TERM GOAL #2   Title Patient will go up/down 4 steps without pain    Time 8   Period Weeks   Status New     PT LONG TERM GOAL #3   Title Pateint will demsotrate a 42 % defecit on FOTO    Time 8   Period Weeks   Status New               Plan - 10/29/15 0837    Clinical Impression Statement Thersapy worked on IASTYM and ultrasound to promote healing of his MCL. He had no significant increase in pain with manual therapy. Patient perfromed 3 basic exerises and had some difficulty. Patient encouraged to continue at home. He had a 15 degree extensor lag with straight leg raise.     Rehab Potential Fair   Clinical Impairments Affecting Rehab Potential multiple co-morbidities    PT Frequency 2x / week   PT Duration 8 weeks   PT Treatment/Interventions ADLs/Self  Care Home Management;Cryotherapy;Electrical Stimulation;Moist Heat;Therapeutic activities;Therapeutic exercise;Neuromuscular re-education;Patient/family education;Passive range of motion;Energy conservation;Ultrasound   PT Next Visit Plan continue with manual therapy and ultrasound if beneificial   PT Home Exercise Plan quad set, SLR, standing wieght shift forward/ back heel raise    Consulted and Agree with Plan of Care Patient      Patient will benefit from skilled therapeutic intervention in order to improve the following deficits and impairments:  Decreased strength, Decreased mobility, Pain, Difficulty walking  Visit Diagnosis: Acute pain of left knee  Muscle weakness (generalized)  Difficulty in walking, not elsewhere classified     Problem List Patient Active Problem List   Diagnosis Date Noted  . Pain in the chest   . Acute on chronic diastolic heart failure ([place]) 07/05/2015  . Volume overload 07/04/2015  . Shortness of breath 07/04/2015  . Hypoxemia 07/04/2015  . Elevated troponin   . End-stage renal disease on hemodialysis ([place])   . Hypervolemia   . 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 Septic joint of left knee joint ([place]) 08/07/2014  . Tachycardia 07/24/2014  . Acute upper respiratory infection 07/24/2014  . Sepsis ([place]) 07/14/2014  . ESRD (end stage renal disease) ([place]) 07/14/2014  . Type II diabetes mellitus ([place]) 07/14/2014  . Anemia in chronic kidney disease 07/14/2014  . Infection of total knee replacement ([place]) 05/31/2014  . Surgical wound  dehiscence 05/09/2014  . Dehiscence of closure of skin 05/09/2014  . Total knee replacement status 04/10/2014  . Diabetes mellitus with renal manifestations, controlled ([place]) 10/24/2013  . Hypertensive renal disease 06/27/2013  . DM type 2 causing vascular disease ([place]) 06/27/2013  . Erectile dysfunction 06/27/2013  . Depression 06/27/2013  . Claudication of left lower extremity ([place]) 12/19/2012  .  Essential hypertension, benign 12/19/2012  . Sinusitis, acute maxillary 11/22/2012  . Otitis, externa, infective 11/14/2012  . Leg edema, left 11/14/2012  . End stage renal disease ([place]) 10/02/2012  . Controlled type 2 DM with proteinuria or microalbuminuria 09/19/2012  . GERD (gastroesophageal reflux disease) 09/19/2012  . Leukocytosis, unspecified 09/19/2012  . Lacunar infarction ([place]) 08/17/2012  . Polymyalgia rheumatica ([place]) 08/17/2012  . 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 Unspecified essential hypertension 05/10/2012  . Unspecified vitamin D deficiency 05/10/2012  . Diabetes mellitus due to underlying condition ([place]) 05/10/2012  . Hyperlipidemia LDL goal <100 05/10/2012  . Anemia of chronic disease 05/10/2012  . Screening for prostate cancer 05/10/2012  . Chronic kidney disease (CKD), stage IV (severe) ([place]) 05/10/2012  . Peripheral autonomic neuropathy due to DM ([place]) 05/10/2012  . Callus of foot 05/10/2012  . Urgency of urination 05/10/2012  . Hyperkalemia 05/10/2012  . Candidiasis of the esophagus 10/12/2011  . Internal hemorrhoids without mention of complication 33/74/4514  . Pre-syncope 07/25/2009  . DJD (degenerative joint disease) of cervical spine 02/17/2009   PHYSICAL THERAPY DISCHARGE SUMMARY  Visits from Start of Care: 2  Current functional level related to goals / functional outcomes: Returned to MD    Remaining deficits: Unknown    Education / Equipment: Unknown Plan: Patient agrees to discharge.  Patient goals were not met. Patient is being discharged due to a change in medical status.  ?????     Carney Living PT DPT  10/29/2015, 8:44 AM  [facility] [address] [place], [place], [address] Phone: [phone]   Fax:  [phone]  Name: Jaramiah Bossard MRN: [id number] Date of Birth: 11-27-1960

## 2015-10-30 ENCOUNTER — Ambulatory Visit: Payer: Medicare HMO | Admitting: Physical Therapy

## 2015-11-04 ENCOUNTER — Ambulatory Visit: Payer: Medicare HMO | Admitting: Physical Therapy

## 2015-11-06 ENCOUNTER — Ambulatory Visit: Payer: Medicare HMO | Admitting: Physical Therapy

## 2015-11-06 ENCOUNTER — Telehealth (INDEPENDENT_AMBULATORY_CARE_PROVIDER_SITE_OTHER): Payer: Self-pay | Admitting: Orthopedic Surgery

## 2015-11-11 ENCOUNTER — Encounter: Payer: Self-pay | Admitting: Physical Therapy

## 2015-11-12 NOTE — Telephone Encounter (Signed)
I spoke with Matthew Kline. We scheduled surgery at [facility] for Wed 11/26/2015.  All surgery information given.

## 2015-11-13 ENCOUNTER — Encounter: Payer: Self-pay | Admitting: Physical Therapy

## 2015-11-19 ENCOUNTER — Other Ambulatory Visit (INDEPENDENT_AMBULATORY_CARE_PROVIDER_SITE_OTHER): Payer: Self-pay | Admitting: Family

## 2015-11-24 ENCOUNTER — Encounter (HOSPITAL_COMMUNITY): Payer: Self-pay

## 2015-11-24 ENCOUNTER — Encounter (HOSPITAL_COMMUNITY)
Admission: RE | Admit: 2015-11-24 | Discharge: 2015-11-24 | Disposition: A | Discharge status: Home / Self Care | Payer: Medicare HMO | Source: Ambulatory Visit | Attending: Orthopedic Surgery | Admitting: Orthopedic Surgery

## 2015-11-24 ENCOUNTER — Other Ambulatory Visit (INDEPENDENT_AMBULATORY_CARE_PROVIDER_SITE_OTHER): Payer: Self-pay | Admitting: Family

## 2015-11-24 DIAGNOSIS — Z01812 Encounter for preprocedural laboratory examination: Secondary | ICD-10-CM | POA: Insufficient documentation

## 2015-11-24 DIAGNOSIS — Y838 Other surgical procedures as the cause of abnormal reaction of the patient, or of later complication, without mention of misadventure at the time of the procedure: Secondary | ICD-10-CM

## 2015-11-24 DIAGNOSIS — Z96652 Presence of left artificial knee joint: Secondary | ICD-10-CM | POA: Insufficient documentation

## 2015-11-24 DIAGNOSIS — T8489XA Other specified complication of internal orthopedic prosthetic devices, implants and grafts, initial encounter: Secondary | ICD-10-CM | POA: Insufficient documentation

## 2015-11-24 HISTORY — DX: Orthostatic hypotension: I95.1

## 2015-11-24 LAB — BASIC METABOLIC PANEL
Anion gap: 13 (ref 5–15)
BUN: 27 mg/dL — AB (ref 6–20)
CO2: 33 mmol/L — ABNORMAL HIGH (ref 22–32)
CREATININE: 4.69 mg/dL — AB (ref 0.61–1.24)
Calcium: 8.8 mg/dL — ABNORMAL LOW (ref 8.9–10.3)
Chloride: 92 mmol/L — ABNORMAL LOW (ref 101–111)
GFR, EST AFRICAN AMERICAN: 15 mL/min — AB (ref >60)
GFR, EST NON AFRICAN AMERICAN: 13 mL/min — AB (ref >60)
Glucose, Bld: 128 mg/dL — ABNORMAL HIGH (ref 65–99)
POTASSIUM: 4.1 mmol/L (ref 3.5–5.1)
SODIUM: 138 mmol/L (ref 135–145)

## 2015-11-24 LAB — CBC
HCT: 33.5 % — ABNORMAL LOW (ref 39.0–52.0)
Hemoglobin: 10.9 g/dL — ABNORMAL LOW (ref 13.0–17.0)
MCH: 29.7 pg (ref 26.0–34.0)
MCHC: 32.5 g/dL (ref 30.0–36.0)
MCV: 91.3 fL (ref 78.0–100.0)
PLATELETS: 206 10*3/uL (ref 150–400)
RBC: 3.67 MIL/uL — AB (ref 4.22–5.81)
RDW: 14.4 % (ref 11.5–15.5)
WBC: 7.9 10*3/uL (ref 4.0–10.5)

## 2015-11-24 LAB — SURGICAL PCR SCREEN
MRSA, PCR: POSITIVE — AB
Staphylococcus aureus: POSITIVE — AB

## 2015-11-24 LAB — GLUCOSE, CAPILLARY: GLUCOSE-CAPILLARY: 100 mg/dL — AB (ref 65–99)

## 2015-11-24 MED ORDER — GABAPENTIN 300 MG PO CAPS: 300.0000 mg | ORAL_CAPSULE | Freq: Three times a day (TID) | ORAL | 0 refills | Status: DC

## 2015-11-24 NOTE — Pre-Procedure Instructions (Addendum)
Matthew Kline  11/24/2015      [id number] [place] [place] [address] [place] [address] [place] [address] Phone: [phone] Fax: [phone]    Your procedure is scheduled on    Wednesday   11/26/15  Report to [facility] Admitting at 630 A.M.  Call this number if you have problems the morning of surgery:  [phone]   Remember:  Do not eat food or drink liquids after midnight.  Take these medicines the morning of surgery with A SIP OF WATER    Albuterol   (stop aspirin, IBUPROFEN/ ADVIL/ MOTRIN/ ALEVE, GOODY POWDERS, BC'S, HERBAL MEDICINES)    How to Manage Your Diabetes Before and After Surgery  Why is it important to control my blood sugar before and after surgery? . Improving blood sugar levels before and after surgery helps healing and can limit problems. . A way of improving blood sugar control is eating a healthy diet by: o  Eating less sugar and carbohydrates o  Increasing activity/exercise o  Talking with your doctor about reaching your blood sugar goals . High blood sugars (greater than 180 mg/dL) can raise your risk of infections and slow your recovery, so you will need to focus on controlling your diabetes during the weeks before surgery. . Make sure that the doctor who takes care of your diabetes knows about your planned surgery including the date and location.  How do I manage my blood sugar before surgery? . Check your blood sugar at least 4 times a day, starting 2 days before surgery, to make sure that the level is not too high or low. o Check your blood sugar the morning of your surgery when you wake up and every 2 hours until you get to the Short Stay unit. . If your blood sugar is less than 70 mg/dL, you will need to treat for low blood sugar: o Do not take insulin. o Treat a low blood sugar (less than 70 mg/dL) with  cup of clear juice (cranberry or apple), 4 glucose  tablets, OR glucose gel. o Recheck blood sugar in 15 minutes after treatment (to make sure it is greater than 70 mg/dL). If your blood sugar is not greater than 70 mg/dL on recheck, call [phone] for further instructions. . Report your blood sugar to the short stay nurse when you get to Short Stay.  . If you are admitted to the hospital after surgery: o Your blood sugar will be checked by the staff and you will probably be given insulin after surgery (instead of oral diabetes medicines) to make sure you have good blood sugar levels. o The goal for blood sugar control after surgery is 80-180 mg/dL.              WHAT DO I DO ABOUT MY DIABETES MEDICATION?   Marland Kitchen Do not take oral diabetes medicines (pills) the morning of surgery.  . THE NIGHT BEFORE SURGERY, take ___________ units of ___________insulin.       Marland Kitchen HE MORNING OF SURGERY, take _____________ units of __________insulin.  . The day of surgery, do not take other diabetes injectables, including Byetta (exenatide), Bydureon (exenatide ER), Victoza (liraglutide), or Trulicity (dulaglutide).  . If your CBG is greater than 220 mg/dL, you may take  of your sliding scale (correction) dose of insulin.  Other Instructions:          Patient Signature:  Date:   Nurse Signature:  Date:   Reviewed and Endorsed by [facility] Patient Education Committee, August 2015  Do not wear jewelry, make-up or nail polish.  Do not wear lotions, powders, or perfumes, or deoderant.  Do not shave 48 hours prior to surgery.  Men may shave face and neck.  Do not bring valuables to the hospital.  [facility] is not responsible for any belongings or valuables.  Contacts, dentures or bridgework may not be worn into surgery.  Leave your suitcase in the car.  After surgery it may be brought to your room.  For patients admitted to the hospital, discharge time will be determined by your treatment team.  Patients discharged the day of surgery  will not be allowed to drive home.   Name and phone number of your driver:    Special instructions:  [place] - Preparing for Surgery  Before surgery, you can play an important role.  Because skin is not sterile, your skin needs to be as free of germs as possible.  You can reduce the number of germs on you skin by washing with CHG (chlorahexidine gluconate) soap before surgery.  CHG is an antiseptic cleaner which kills germs and bonds with the skin to continue killing germs even after washing.  Please DO NOT use if you have an allergy to CHG or antibacterial soaps.  If your skin becomes reddened/irritated stop using the CHG and inform your nurse when you arrive at Short Stay.  Do not shave (including legs and underarms) for at least 48 hours prior to the first CHG shower.  You may shave your face.  Please follow these instructions carefully:   1.  Shower with CHG Soap the night before surgery and the                                morning of Surgery.  2.  If you choose to wash your hair, wash your hair first as usual with your       normal shampoo.  3.  After you shampoo, rinse your hair and body thoroughly to remove the                      Shampoo.  4.  Use CHG as you would any other liquid soap.  You can apply chg directly       to the skin and wash gently with scrungie or a clean washcloth.  5.  Apply the CHG Soap to your body ONLY FROM THE NECK DOWN.        Do not use on open wounds or open sores.  Avoid contact with your eyes,       ears, mouth and genitals (private parts).  Wash genitals (private parts)       with your normal soap.  6.  Wash thoroughly, paying special attention to the area where your surgery        will be performed.  7.  Thoroughly rinse your body with warm water from the neck down.  8.  DO NOT shower/wash with your normal soap after using and rinsing off       the CHG Soap.  9.  Pat yourself dry with a clean towel.            10.  Wear clean pajamas.            11.   Place clean sheets on your bed  the night of your first shower and do not        sleep with pets.  Day of Surgery  Do not apply any lotions/deoderants the morning of surgery.  Please wear clean clothes to the hospital/surgery center.    Please read over the following fact sheets that you were given. MRSA Information and Surgical Site Infection Prevention

## 2015-11-24 NOTE — Progress Notes (Signed)
[facility] [place] [place], [place].

## 2015-11-25 LAB — HEMOGLOBIN A1C
Hgb A1c MFr Bld: 6.6 % — ABNORMAL HIGH (ref 4.8–5.6)
MEAN PLASMA GLUCOSE: 143 mg/dL

## 2015-11-25 MED ORDER — CEFAZOLIN SODIUM-DEXTROSE 2-4 GM/100ML-% IV SOLN
2.0000 g | INTRAVENOUS | Status: AC
  Administered 2015-11-26: 2 g via INTRAVENOUS
  Filled 2015-11-25: qty 100

## 2015-11-26 ENCOUNTER — Encounter (HOSPITAL_COMMUNITY)
Admission: RE | Disposition: A | Discharge status: Home Health | Payer: Self-pay | Source: Ambulatory Visit | Attending: Orthopedic Surgery

## 2015-11-26 ENCOUNTER — Inpatient Hospital Stay (HOSPITAL_COMMUNITY): Payer: Medicare HMO | Admitting: Certified Registered Nurse Anesthetist

## 2015-11-26 ENCOUNTER — Inpatient Hospital Stay (HOSPITAL_COMMUNITY)
Admission: RE | Admit: 2015-11-26 | Discharge: 2015-12-01 | DRG: 466 | Disposition: A | Discharge status: Home Health | Payer: Medicare HMO | Source: Ambulatory Visit | Attending: Orthopedic Surgery | Admitting: Orthopedic Surgery

## 2015-11-26 DIAGNOSIS — T84023A Instability of internal left knee prosthesis, initial encounter: Secondary | ICD-10-CM | POA: Diagnosis not present

## 2015-11-26 DIAGNOSIS — G8929 Other chronic pain: Secondary | ICD-10-CM | POA: Diagnosis present

## 2015-11-26 DIAGNOSIS — E1142 Type 2 diabetes mellitus with diabetic polyneuropathy: Secondary | ICD-10-CM | POA: Diagnosis present

## 2015-11-26 DIAGNOSIS — E1122 Type 2 diabetes mellitus with diabetic chronic kidney disease: Secondary | ICD-10-CM | POA: Diagnosis present

## 2015-11-26 DIAGNOSIS — T8454XA Infection and inflammatory reaction due to internal left knee prosthesis, initial encounter: Secondary | ICD-10-CM | POA: Diagnosis not present

## 2015-11-26 DIAGNOSIS — N186 End stage renal disease: Secondary | ICD-10-CM | POA: Diagnosis not present

## 2015-11-26 DIAGNOSIS — Z89422 Acquired absence of other left toe(s): Secondary | ICD-10-CM | POA: Diagnosis not present

## 2015-11-26 DIAGNOSIS — D631 Anemia in chronic kidney disease: Secondary | ICD-10-CM | POA: Diagnosis present

## 2015-11-26 DIAGNOSIS — Z96652 Presence of left artificial knee joint: Secondary | ICD-10-CM

## 2015-11-26 DIAGNOSIS — Z8249 Family history of ischemic heart disease and other diseases of the circulatory system: Secondary | ICD-10-CM | POA: Diagnosis not present

## 2015-11-26 DIAGNOSIS — Z992 Dependence on renal dialysis: Secondary | ICD-10-CM | POA: Diagnosis not present

## 2015-11-26 DIAGNOSIS — Y831 Surgical operation with implant of artificial internal device as the cause of abnormal reaction of the patient, or of later complication, without mention of misadventure at the time of the procedure: Secondary | ICD-10-CM

## 2015-11-26 DIAGNOSIS — T84018S Broken internal joint prosthesis, other site, sequela: Secondary | ICD-10-CM

## 2015-11-26 DIAGNOSIS — T8454XD Infection and inflammatory reaction due to internal left knee prosthesis, subsequent encounter: Secondary | ICD-10-CM | POA: Diagnosis not present

## 2015-11-26 DIAGNOSIS — M00862 Arthritis due to other bacteria, left knee: Secondary | ICD-10-CM | POA: Diagnosis not present

## 2015-11-26 DIAGNOSIS — G8918 Other acute postprocedural pain: Secondary | ICD-10-CM | POA: Diagnosis not present

## 2015-11-26 DIAGNOSIS — F17210 Nicotine dependence, cigarettes, uncomplicated: Secondary | ICD-10-CM | POA: Diagnosis present

## 2015-11-26 DIAGNOSIS — M898X9 Other specified disorders of bone, unspecified site: Secondary | ICD-10-CM | POA: Diagnosis present

## 2015-11-26 DIAGNOSIS — I252 Old myocardial infarction: Secondary | ICD-10-CM | POA: Diagnosis not present

## 2015-11-26 DIAGNOSIS — I959 Hypotension, unspecified: Secondary | ICD-10-CM | POA: Diagnosis not present

## 2015-11-26 DIAGNOSIS — I2510 Atherosclerotic heart disease of native coronary artery without angina pectoris: Secondary | ICD-10-CM | POA: Diagnosis present

## 2015-11-26 DIAGNOSIS — Z89412 Acquired absence of left great toe: Secondary | ICD-10-CM

## 2015-11-26 DIAGNOSIS — N2581 Secondary hyperparathyroidism of renal origin: Secondary | ICD-10-CM | POA: Diagnosis present

## 2015-11-26 DIAGNOSIS — B9689 Other specified bacterial agents as the cause of diseases classified elsewhere: Secondary | ICD-10-CM | POA: Diagnosis not present

## 2015-11-26 DIAGNOSIS — Y792 Prosthetic and other implants, materials and accessory orthopedic devices associated with adverse incidents: Secondary | ICD-10-CM | POA: Diagnosis present

## 2015-11-26 DIAGNOSIS — Z8673 Personal history of transient ischemic attack (TIA), and cerebral infarction without residual deficits: Secondary | ICD-10-CM | POA: Diagnosis not present

## 2015-11-26 DIAGNOSIS — D62 Acute posthemorrhagic anemia: Secondary | ICD-10-CM | POA: Diagnosis not present

## 2015-11-26 DIAGNOSIS — Z823 Family history of stroke: Secondary | ICD-10-CM

## 2015-11-26 DIAGNOSIS — I120 Hypertensive chronic kidney disease with stage 5 chronic kidney disease or end stage renal disease: Secondary | ICD-10-CM | POA: Diagnosis not present

## 2015-11-26 DIAGNOSIS — Z8041 Family history of malignant neoplasm of ovary: Secondary | ICD-10-CM | POA: Diagnosis not present

## 2015-11-26 DIAGNOSIS — Z7982 Long term (current) use of aspirin: Secondary | ICD-10-CM | POA: Diagnosis not present

## 2015-11-26 DIAGNOSIS — Z96659 Presence of unspecified artificial knee joint: Secondary | ICD-10-CM

## 2015-11-26 HISTORY — PX: TOTAL KNEE REVISION: SHX996

## 2015-11-26 LAB — POCT I-STAT 4, (NA,K, GLUC, HGB,HCT)
GLUCOSE: 134 mg/dL — AB (ref 65–99)
Glucose, Bld: 119 mg/dL — ABNORMAL HIGH (ref 65–99)
Glucose, Bld: 131 mg/dL — ABNORMAL HIGH (ref 65–99)
HCT: 21 % — ABNORMAL LOW (ref 39.0–52.0)
HEMATOCRIT: 29 % — AB (ref 39.0–52.0)
HEMATOCRIT: 27 % — AB (ref 39.0–52.0)
HEMOGLOBIN: 9.9 g/dL — AB (ref 13.0–17.0)
HEMOGLOBIN: 7.1 g/dL — AB (ref 13.0–17.0)
Hemoglobin: 9.2 g/dL — ABNORMAL LOW (ref 13.0–17.0)
POTASSIUM: 6 mmol/L — AB (ref 3.5–5.1)
Potassium: 4.9 mmol/L (ref 3.5–5.1)
Potassium: 5.2 mmol/L — ABNORMAL HIGH (ref 3.5–5.1)
SODIUM: 138 mmol/L (ref 135–145)
Sodium: 138 mmol/L (ref 135–145)
Sodium: 137 mmol/L (ref 135–145)

## 2015-11-26 LAB — PREPARE RBC (CROSSMATCH)

## 2015-11-26 LAB — BASIC METABOLIC PANEL
ANION GAP: 12 (ref 5–15)
BUN: 61 mg/dL — ABNORMAL HIGH (ref 6–20)
CALCIUM: 8.8 mg/dL — AB (ref 8.9–10.3)
CO2: 26 mmol/L (ref 22–32)
Chloride: 100 mmol/L — ABNORMAL LOW (ref 101–111)
Creatinine, Ser: 8.53 mg/dL — ABNORMAL HIGH (ref 0.61–1.24)
GFR calc Af Amer: 7 mL/min — ABNORMAL LOW (ref >60)
GFR, EST NON AFRICAN AMERICAN: 6 mL/min — AB (ref >60)
Glucose, Bld: 165 mg/dL — ABNORMAL HIGH (ref 65–99)
POTASSIUM: 5.2 mmol/L — AB (ref 3.5–5.1)
SODIUM: 138 mmol/L (ref 135–145)

## 2015-11-26 LAB — GLUCOSE, CAPILLARY
Glucose-Capillary: 120 mg/dL — ABNORMAL HIGH (ref 65–99)
Glucose-Capillary: 91 mg/dL (ref 65–99)

## 2015-11-26 LAB — RENAL FUNCTION PANEL
ANION GAP: 12 (ref 5–15)
Albumin: 3.3 g/dL — ABNORMAL LOW (ref 3.5–5.0)
BUN: 40 mg/dL — ABNORMAL HIGH (ref 6–20)
CALCIUM: 8.7 mg/dL — AB (ref 8.9–10.3)
CO2: 25 mmol/L (ref 22–32)
CREATININE: 5.88 mg/dL — AB (ref 0.61–1.24)
Chloride: 99 mmol/L — ABNORMAL LOW (ref 101–111)
GFR, EST AFRICAN AMERICAN: 11 mL/min — AB (ref >60)
GFR, EST NON AFRICAN AMERICAN: 10 mL/min — AB (ref >60)
Glucose, Bld: 117 mg/dL — ABNORMAL HIGH (ref 65–99)
PHOSPHORUS: 4.4 mg/dL (ref 2.5–4.6)
Potassium: 4.7 mmol/L (ref 3.5–5.1)
SODIUM: 136 mmol/L (ref 135–145)

## 2015-11-26 LAB — CBC
HCT: 25.2 % — ABNORMAL LOW (ref 39.0–52.0)
HEMATOCRIT: 22.2 % — AB (ref 39.0–52.0)
HEMOGLOBIN: 7.4 g/dL — AB (ref 13.0–17.0)
HEMOGLOBIN: 8.5 g/dL — AB (ref 13.0–17.0)
MCH: 29.8 pg (ref 26.0–34.0)
MCH: 29.4 pg (ref 26.0–34.0)
MCHC: 33.3 g/dL (ref 30.0–36.0)
MCHC: 33.7 g/dL (ref 30.0–36.0)
MCV: 89.5 fL (ref 78.0–100.0)
MCV: 87.2 fL (ref 78.0–100.0)
Platelets: 125 10*3/uL — ABNORMAL LOW (ref 150–400)
RBC: 2.48 MIL/uL — ABNORMAL LOW (ref 4.22–5.81)
RBC: 2.89 MIL/uL — AB (ref 4.22–5.81)
RDW: 14.5 % (ref 11.5–15.5)
RDW: 14.8 % (ref 11.5–15.5)
WBC: 9.5 10*3/uL (ref 4.0–10.5)
WBC: 5.5 10*3/uL (ref 4.0–10.5)

## 2015-11-26 SURGERY — TOTAL KNEE REVISION
Anesthesia: General | Site: Knee | Laterality: Left

## 2015-11-26 MED ORDER — CALCITRIOL 1 MCG/ML PO SOLN
1.0000 ug | ORAL | Status: DC
  Administered 2015-11-28: 1 ug via ORAL
  Filled 2015-11-26 (×3): qty 1

## 2015-11-26 MED ORDER — ALBUMIN HUMAN 5 % IV SOLN
INTRAVENOUS | Status: AC
  Administered 2015-11-26: 12.5 g via INTRAVENOUS
  Filled 2015-11-26: qty 250

## 2015-11-26 MED ORDER — MENTHOL 3 MG MT LOZG: 1.0000 | LOZENGE | OROMUCOSAL | Status: DC | PRN

## 2015-11-26 MED ORDER — ALBUTEROL SULFATE (2.5 MG/3ML) 0.083% IN NEBU
3.0000 mL | INHALATION_SOLUTION | Freq: Four times a day (QID) | RESPIRATORY_TRACT | Status: DC | PRN
Start: 2015-11-26 — End: 2015-12-01

## 2015-11-26 MED ORDER — METOCLOPRAMIDE HCL 5 MG/ML IJ SOLN: 5.0000 mg | Freq: Three times a day (TID) | INTRAMUSCULAR | Status: DC | PRN

## 2015-11-26 MED ORDER — TEMAZEPAM 15 MG PO CAPS: 30.0000 mg | ORAL_CAPSULE | Freq: Every evening | ORAL | Status: DC | PRN

## 2015-11-26 MED ORDER — SODIUM CHLORIDE 0.9 % IV SOLN: Freq: Once | INTRAVENOUS | Status: DC

## 2015-11-26 MED ORDER — POLYETHYLENE GLYCOL 3350 17 G PO PACK
17.0000 g | PACK | Freq: Every day | ORAL | Status: DC | PRN
  Administered 2015-11-29: 17 g via ORAL
  Filled 2015-11-26: qty 1

## 2015-11-26 MED ORDER — BUPIVACAINE HCL (PF) 0.25 % IJ SOLN
INTRAMUSCULAR | Status: DC | PRN
  Administered 2015-11-26: 20 mL

## 2015-11-26 MED ORDER — METHOCARBAMOL 500 MG PO TABS
500.0000 mg | ORAL_TABLET | Freq: Four times a day (QID) | ORAL | Status: DC | PRN
  Administered 2015-11-26 – 2015-12-01 (×5): 500 mg via ORAL
  Filled 2015-11-26 (×4): qty 1

## 2015-11-26 MED ORDER — LACTATED RINGERS IV SOLN
INTRAVENOUS | Status: DC | PRN
  Administered 2015-11-26: 08:00:00 via INTRAVENOUS

## 2015-11-26 MED ORDER — SEVELAMER CARBONATE 800 MG PO TABS: 800.0000 mg | ORAL_TABLET | Freq: Three times a day (TID) | ORAL | Status: DC | PRN

## 2015-11-26 MED ORDER — FENTANYL CITRATE (PF) 100 MCG/2ML IJ SOLN
INTRAMUSCULAR | Status: AC
  Administered 2015-11-26: 50 ug via INTRAVENOUS
  Filled 2015-11-26: qty 2

## 2015-11-26 MED ORDER — ONDANSETRON HCL 4 MG/2ML IJ SOLN: 4.0000 mg | Freq: Four times a day (QID) | INTRAMUSCULAR | Status: DC | PRN

## 2015-11-26 MED ORDER — PROPOFOL 10 MG/ML IV BOLUS
INTRAVENOUS | Status: DC | PRN
  Administered 2015-11-26: 150 mg via INTRAVENOUS

## 2015-11-26 MED ORDER — FENTANYL CITRATE (PF) 100 MCG/2ML IJ SOLN
INTRAMUSCULAR | Status: AC
  Filled 2015-11-26: qty 2

## 2015-11-26 MED ORDER — ONDANSETRON HCL 4 MG/2ML IJ SOLN
INTRAMUSCULAR | Status: DC | PRN
Start: 2015-11-26 — End: 2015-11-26
  Administered 2015-11-26: 4 mg via INTRAVENOUS

## 2015-11-26 MED ORDER — OXYCODONE HCL 5 MG PO TABS
5.0000 mg | ORAL_TABLET | ORAL | Status: DC | PRN
  Administered 2015-11-26 – 2015-11-30 (×7): 10 mg via ORAL
  Filled 2015-11-26 (×6): qty 2

## 2015-11-26 MED ORDER — MIDAZOLAM HCL 5 MG/5ML IJ SOLN
INTRAMUSCULAR | Status: DC | PRN
  Administered 2015-11-26 (×2): 1 mg via INTRAVENOUS

## 2015-11-26 MED ORDER — CHLORHEXIDINE GLUCONATE 4 % EX LIQD: 60.0000 mL | Freq: Once | CUTANEOUS | Status: DC

## 2015-11-26 MED ORDER — SODIUM CHLORIDE 0.9 % IV SOLN
INTRAVENOUS | Status: DC | PRN
  Administered 2015-11-26 (×2): via INTRAVENOUS

## 2015-11-26 MED ORDER — OXYCODONE HCL 5 MG PO TABS
ORAL_TABLET | ORAL | Status: AC
  Filled 2015-11-26: qty 2

## 2015-11-26 MED ORDER — GABAPENTIN 300 MG PO CAPS
300.0000 mg | ORAL_CAPSULE | Freq: Three times a day (TID) | ORAL | Status: DC
  Administered 2015-11-26 – 2015-11-27 (×2): 300 mg via ORAL
  Filled 2015-11-26 (×2): qty 1

## 2015-11-26 MED ORDER — ONDANSETRON HCL 4 MG PO TABS: 4.0000 mg | ORAL_TABLET | Freq: Four times a day (QID) | ORAL | Status: DC | PRN

## 2015-11-26 MED ORDER — BISACODYL 5 MG PO TBEC: 5.0000 mg | DELAYED_RELEASE_TABLET | Freq: Every day | ORAL | Status: DC | PRN

## 2015-11-26 MED ORDER — PHENYLEPHRINE HCL 10 MG/ML IJ SOLN
INTRAMUSCULAR | Status: DC | PRN
Start: 2015-11-26 — End: 2015-11-26
  Administered 2015-11-26 (×3): 120 ug via INTRAVENOUS
  Administered 2015-11-26: 100 ug via INTRAVENOUS
  Administered 2015-11-26: 80 ug via INTRAVENOUS

## 2015-11-26 MED ORDER — SEVELAMER CARBONATE 800 MG PO TABS: 800.0000 mg | ORAL_TABLET | Freq: Three times a day (TID) | ORAL | Status: DC

## 2015-11-26 MED ORDER — MIDAZOLAM HCL 2 MG/2ML IJ SOLN
INTRAMUSCULAR | Status: AC
  Filled 2015-11-26: qty 2

## 2015-11-26 MED ORDER — ACETAMINOPHEN 325 MG PO TABS
ORAL_TABLET | ORAL | Status: AC
  Filled 2015-11-26: qty 2

## 2015-11-26 MED ORDER — SEVELAMER CARBONATE 800 MG PO TABS
2400.0000 mg | ORAL_TABLET | Freq: Three times a day (TID) | ORAL | Status: DC
  Administered 2015-11-27 – 2015-12-01 (×10): 2400 mg via ORAL
  Filled 2015-11-26 (×10): qty 3

## 2015-11-26 MED ORDER — CALCIUM CHLORIDE 10 % IV SOLN
INTRAVENOUS | Status: DC | PRN
  Administered 2015-11-26: 300 mg via INTRAVENOUS

## 2015-11-26 MED ORDER — TRANEXAMIC ACID 1000 MG/10ML IV SOLN
INTRAVENOUS | Status: DC | PRN
  Administered 2015-11-26: 2000 mg via TOPICAL

## 2015-11-26 MED ORDER — PROPOFOL 10 MG/ML IV BOLUS
INTRAVENOUS | Status: AC
  Filled 2015-11-26: qty 40

## 2015-11-26 MED ORDER — DOCUSATE SODIUM 100 MG PO CAPS
100.0000 mg | ORAL_CAPSULE | Freq: Two times a day (BID) | ORAL | Status: DC
  Administered 2015-11-26 – 2015-12-01 (×10): 100 mg via ORAL
  Filled 2015-11-26 (×10): qty 1

## 2015-11-26 MED ORDER — ACETAMINOPHEN 325 MG PO TABS
650.0000 mg | ORAL_TABLET | Freq: Four times a day (QID) | ORAL | Status: DC | PRN
Start: 2015-11-26 — End: 2015-12-01
  Administered 2015-11-26 – 2015-11-29 (×5): 650 mg via ORAL
  Filled 2015-11-26 (×4): qty 2

## 2015-11-26 MED ORDER — EPHEDRINE SULFATE 50 MG/ML IJ SOLN
INTRAMUSCULAR | Status: DC | PRN
  Administered 2015-11-26: 15 mg via INTRAVENOUS

## 2015-11-26 MED ORDER — METHOCARBAMOL 1000 MG/10ML IJ SOLN
500.0000 mg | Freq: Four times a day (QID) | INTRAVENOUS | Status: DC | PRN
  Filled 2015-11-26: qty 5

## 2015-11-26 MED ORDER — SODIUM CHLORIDE 0.9 % IV SOLN
INTRAVENOUS | Status: DC
  Administered 2015-11-26: 22:00:00 via INTRAVENOUS

## 2015-11-26 MED ORDER — MONTELUKAST SODIUM 10 MG PO TABS
10.0000 mg | ORAL_TABLET | Freq: Every day | ORAL | Status: DC
  Administered 2015-11-26 – 2015-11-30 (×5): 10 mg via ORAL
  Filled 2015-11-26 (×5): qty 1

## 2015-11-26 MED ORDER — PHENOL 1.4 % MT LIQD: 1.0000 | OROMUCOSAL | Status: DC | PRN

## 2015-11-26 MED ORDER — HYDROMORPHONE HCL 1 MG/ML IJ SOLN
1.0000 mg | INTRAMUSCULAR | Status: DC | PRN
Start: 2015-11-26 — End: 2015-12-01
  Administered 2015-11-26: 1 mg via INTRAVENOUS
  Administered 2015-11-26: 0.5 mg via INTRAVENOUS
  Administered 2015-11-27 – 2015-11-30 (×10): 1 mg via INTRAVENOUS
  Filled 2015-11-26 (×9): qty 1

## 2015-11-26 MED ORDER — MAGNESIUM CITRATE PO SOLN: 1.0000 | Freq: Once | ORAL | Status: DC | PRN

## 2015-11-26 MED ORDER — ALBUMIN HUMAN 5 % IV SOLN
INTRAVENOUS | Status: DC | PRN
  Administered 2015-11-26 (×2): via INTRAVENOUS

## 2015-11-26 MED ORDER — ASPIRIN EC 325 MG PO TBEC
325.0000 mg | DELAYED_RELEASE_TABLET | Freq: Every day | ORAL | Status: DC
  Administered 2015-11-27 – 2015-12-01 (×5): 325 mg via ORAL
  Filled 2015-11-26 (×5): qty 1

## 2015-11-26 MED ORDER — HYDROMORPHONE HCL 1 MG/ML IJ SOLN
INTRAMUSCULAR | Status: AC
  Administered 2015-11-26: 0.5 mg via INTRAVENOUS
  Filled 2015-11-26: qty 0.5

## 2015-11-26 MED ORDER — HYDROMORPHONE HCL 1 MG/ML IJ SOLN
INTRAMUSCULAR | Status: AC
  Filled 2015-11-26: qty 0.5

## 2015-11-26 MED ORDER — METOCLOPRAMIDE HCL 5 MG PO TABS: 5.0000 mg | ORAL_TABLET | Freq: Three times a day (TID) | ORAL | Status: DC | PRN

## 2015-11-26 MED ORDER — ALBUMIN HUMAN 5 % IV SOLN
12.5000 g | Freq: Once | INTRAVENOUS | Status: AC
  Administered 2015-11-26: 12.5 g via INTRAVENOUS

## 2015-11-26 MED ORDER — ACETAMINOPHEN 650 MG RE SUPP: 650.0000 mg | Freq: Four times a day (QID) | RECTAL | Status: DC | PRN

## 2015-11-26 MED ORDER — LIDOCAINE HCL (CARDIAC) 20 MG/ML IV SOLN
INTRAVENOUS | Status: DC | PRN
  Administered 2015-11-26: 60 mg via INTRAVENOUS

## 2015-11-26 MED ORDER — SUCCINYLCHOLINE CHLORIDE 20 MG/ML IJ SOLN
INTRAMUSCULAR | Status: DC | PRN
  Administered 2015-11-26: 80 mg via INTRAVENOUS

## 2015-11-26 MED ORDER — BUPIVACAINE HCL (PF) 0.25 % IJ SOLN
INTRAMUSCULAR | Status: AC
  Filled 2015-11-26: qty 30

## 2015-11-26 MED ORDER — TRANEXAMIC ACID 1000 MG/10ML IV SOLN
2000.0000 mg | Freq: Once | INTRAVENOUS | Status: DC
  Filled 2015-11-26: qty 20

## 2015-11-26 MED ORDER — METHOCARBAMOL 500 MG PO TABS
ORAL_TABLET | ORAL | Status: AC
  Filled 2015-11-26: qty 1

## 2015-11-26 MED ORDER — CEFAZOLIN SODIUM-DEXTROSE 2-4 GM/100ML-% IV SOLN
2.0000 g | Freq: Once | INTRAVENOUS | Status: AC
  Administered 2015-11-27: 2 g via INTRAVENOUS
  Filled 2015-11-26 (×2): qty 100

## 2015-11-26 MED ORDER — SODIUM CHLORIDE 0.9 % IR SOLN
Status: DC | PRN
  Administered 2015-11-26: 3000 mL

## 2015-11-26 MED ORDER — INSULIN ASPART 100 UNIT/ML ~~LOC~~ SOLN
0.0000 [IU] | Freq: Three times a day (TID) | SUBCUTANEOUS | Status: DC
  Administered 2015-11-27: 2 [IU] via SUBCUTANEOUS
  Administered 2015-11-28 – 2015-11-29 (×2): 3 [IU] via SUBCUTANEOUS
  Administered 2015-11-29 – 2015-11-30 (×2): 2 [IU] via SUBCUTANEOUS

## 2015-11-26 MED ORDER — FENTANYL CITRATE (PF) 100 MCG/2ML IJ SOLN
25.0000 ug | INTRAMUSCULAR | Status: DC | PRN
  Administered 2015-11-26 (×2): 50 ug via INTRAVENOUS

## 2015-11-26 MED ORDER — PHENYLEPHRINE HCL 10 MG/ML IJ SOLN
INTRAMUSCULAR | Status: DC | PRN
  Administered 2015-11-26: 25 ug/min via INTRAVENOUS

## 2015-11-26 MED ORDER — RENA-VITE PO TABS
1.0000 | ORAL_TABLET | Freq: Every day | ORAL | Status: DC
  Administered 2015-11-26 – 2015-11-30 (×5): 1 via ORAL
  Filled 2015-11-26 (×5): qty 1

## 2015-11-26 MED ORDER — 0.9 % SODIUM CHLORIDE (POUR BTL) OPTIME
TOPICAL | Status: DC | PRN
  Administered 2015-11-26: 1000 mL

## 2015-11-26 MED ORDER — FENTANYL CITRATE (PF) 100 MCG/2ML IJ SOLN
INTRAMUSCULAR | Status: DC | PRN
  Administered 2015-11-26 (×4): 50 ug via INTRAVENOUS

## 2015-11-26 SURGICAL SUPPLY — 70 items
BAG DECANTER FOR FLEXI CONT (MISCELLANEOUS) ×3 IMPLANT
BLADE SAW SAG 90X13X1.27 (BLADE) ×3 IMPLANT
BLADE SAW SGTL 13.0X1.19X90.0M (BLADE) ×3 IMPLANT
BLADE SAW SGTL 81X20 HD (BLADE) ×3 IMPLANT
BLADE SURG 10 STRL SS (BLADE) ×6 IMPLANT
BLADE SURG 21 STRL SS (BLADE) ×12 IMPLANT
BNDG COHESIVE 6X5 TAN STRL LF (GAUZE/BANDAGES/DRESSINGS) ×6 IMPLANT
BONE CEMENT PALACOS R-G (Orthopedic Implant) ×6 IMPLANT
BOWL SMART MIX CTS (DISPOSABLE) IMPLANT
CEMENT BONE PALACOS R-G (Orthopedic Implant) ×2 IMPLANT
CONT SPEC 4OZ CLIKSEAL STRL BL (MISCELLANEOUS) ×3 IMPLANT
COVER BACK TABLE 24X17X13 BIG (DRAPES) IMPLANT
COVER SURGICAL LIGHT HANDLE (MISCELLANEOUS) ×9 IMPLANT
CUFF TOURNIQUET SINGLE 34IN LL (TOURNIQUET CUFF) ×3 IMPLANT
CUFF TOURNIQUET SINGLE 44IN (TOURNIQUET CUFF) IMPLANT
DECANTER SPIKE VIAL GLASS SM (MISCELLANEOUS) ×3 IMPLANT
DRAPE EXTREMITY TIBURON (DRAPES) ×3 IMPLANT
DRAPE HALF SHEET 40X57 (DRAPES) ×3 IMPLANT
DRAPE IMP U-DRAPE 54X76 (DRAPES) ×3 IMPLANT
DRAPE PROXIMA HALF (DRAPES) ×3 IMPLANT
DRAPE U-SHAPE 47X51 STRL (DRAPES) ×3 IMPLANT
DRSG ADAPTIC 3X8 NADH LF (GAUZE/BANDAGES/DRESSINGS) ×3 IMPLANT
DRSG PAD ABDOMINAL 8X10 ST (GAUZE/BANDAGES/DRESSINGS) ×3 IMPLANT
DURAPREP 26ML APPLICATOR (WOUND CARE) ×3 IMPLANT
ELECT CAUTERY BLADE 6.4 (BLADE) ×3 IMPLANT
ELECT REM PT RETURN 9FT ADLT (ELECTROSURGICAL) ×3
ELECTRODE REM PT RTRN 9FT ADLT (ELECTROSURGICAL) ×1 IMPLANT
FACESHIELD WRAPAROUND (MASK) ×3 IMPLANT
GAUZE SPONGE 4X4 12PLY STRL (GAUZE/BANDAGES/DRESSINGS) ×3 IMPLANT
GLOVE BIOGEL PI IND STRL 9 (GLOVE) ×1 IMPLANT
GLOVE BIOGEL PI INDICATOR 9 (GLOVE) ×2
GLOVE SURG ORTHO 9.0 STRL STRW (GLOVE) ×3 IMPLANT
GOWN STRL REUS W/ TWL XL LVL3 (GOWN DISPOSABLE) ×3 IMPLANT
GOWN STRL REUS W/TWL XL LVL3 (GOWN DISPOSABLE) ×6
HANDPIECE INTERPULSE COAX TIP (DISPOSABLE) ×2
INSERT HINGE TIB SM 18MM UNIV (Knees) ×3 IMPLANT
INSERT SMALL LEFT SROM (Orthopedic Implant) ×1 IMPLANT
KIT BASIN OR (CUSTOM PROCEDURE TRAY) ×3 IMPLANT
KIT ROOM TURNOVER OR (KITS) ×3 IMPLANT
MANIFOLD NEPTUNE II (INSTRUMENTS) ×3 IMPLANT
NEEDLE SPNL 18GX3.5 QUINCKE PK (NEEDLE) ×3 IMPLANT
NS IRRIG 1000ML POUR BTL (IV SOLUTION) ×3 IMPLANT
PACK TOTAL JOINT (CUSTOM PROCEDURE TRAY) ×3 IMPLANT
PACK UNIVERSAL I (CUSTOM PROCEDURE TRAY) IMPLANT
PAD ARMBOARD 7.5X6 YLW CONV (MISCELLANEOUS) ×6 IMPLANT
PADDING CAST COTTON 6X4 STRL (CAST SUPPLIES) ×3 IMPLANT
PREVENA INCISION MGT 90 150 (MISCELLANEOUS) ×3 IMPLANT
SET HNDPC FAN SPRY TIP SCT (DISPOSABLE) ×1 IMPLANT
SLEEVE UNIV FEM DIST PRO SZ 31 (Sleeve) ×3 IMPLANT
SPONGE LAP 18X18 X RAY DECT (DISPOSABLE) ×3 IMPLANT
SROM INSERT SMALL LEFT (Orthopedic Implant) ×3 IMPLANT
STAPLER VISISTAT 35W (STAPLE) ×3 IMPLANT
STEM UNIVERSAL REVISION 75X14 (Stem) ×3 IMPLANT
STEM UNIVERSAL REVISION 75X16 (Stem) ×3 IMPLANT
SUCTION FRAZIER HANDLE 10FR (MISCELLANEOUS)
SUCTION TUBE FRAZIER 10FR DISP (MISCELLANEOUS) IMPLANT
SUT ETHILON 2 0 PSLX (SUTURE) ×12 IMPLANT
SUT VIC AB 0 CTB1 27 (SUTURE) IMPLANT
SUT VIC AB 1 CTX 27 (SUTURE) ×6 IMPLANT
SUT VIC AB 1 CTX 36 (SUTURE)
SUT VIC AB 1 CTX36XBRD ANBCTR (SUTURE) IMPLANT
SUT VIC AB 2-0 CTB1 (SUTURE) IMPLANT
TOWEL OR 17X24 6PK STRL BLUE (TOWEL DISPOSABLE) ×3 IMPLANT
TOWEL OR 17X26 10 PK STRL BLUE (TOWEL DISPOSABLE) ×3 IMPLANT
TRAY FOLEY CATH 16FRSI W/METER (SET/KITS/TRAYS/PACK) IMPLANT
TRAY REVISION SZ 4 (Knees) ×3 IMPLANT
TUBE ANAEROBIC SPECIMEN COL (MISCELLANEOUS) IMPLANT
WATER STERILE IRR 1000ML POUR (IV SOLUTION) ×3 IMPLANT
WRAP KNEE MAXI GEL POST OP (GAUZE/BANDAGES/DRESSINGS) ×3 IMPLANT
YANKAUER SUCT BULB TIP NO VENT (SUCTIONS) ×3 IMPLANT

## 2015-11-26 NOTE — Consult Note (Signed)
[facility] Renal Consultation Note  Indication for Consultation:  Management of ESRD/hemodialysis; anemia, hypertension/volume and secondary hyperparathyroidism  HPI: Matthew Kline is a 55 y.o. male ESRD presumed d/t DM (started HD  12/2012 now at [facility] MWF ) PMR with chronic pain, ho Lacunar CVA, R TKR  and L TKR followed  By Dr. Sharol Given . Admitted with ho unstable total knee arthroplasty thus presents at this time for revision. He is compliant with HD and last HD was 11/24/15 on schedule . Now post op  With noted low BP in PACU improved with iV fluids , noted given 2 u PRBCS in OR and HGB  Low post op with bp up to 103/ now K 6.0  On  ISTAT . Pt is alert and fully  Oriented  Co post op Knee discomfort.  We are consulted for HD / ESRD issues  With plans for hd today , no heparin and transfuse PRBCS on HD .Recheck labs with cbc and Renal panel pre hd.   Past Medical History:  Diagnosis Date  . Allergy   . Anemia, unspecified   . Anxiety   . Arthralgia 2010   polyarticular  . Arthritis    "back, knees" (05/09/2014)  . CHF (congestive heart failure) ([place])   . CHF (congestive heart failure) ([place]) 07/25/2009   denies  . Chronic lower back pain   . Coughing    pt. reports that he has drainage from sinus infection  . Diabetic neuropathy ([place])   . ESRD (end stage renal disease) on dialysis [facility])    started 12/2012; "MWF; [place] "   . GERD (gastroesophageal reflux disease)    hx "before I lost weight", no problem 9 years  . Hemodialysis access site with mature fistula ([place])   . Hemorrhoids, internal 10/2011   small  . History of blood transfusion    "related to the anemia"  . Hypertension   . Insomnia, unspecified   . Knee pain, left   . Lacunar infarction ([place]) 2006   RUE/RLE, speech  . Long term (current) use of anticoagulants   . Myocardial infarction 1995  . Orthostatic hypotension   . Osteomyelitis of foot, left, acute ([place])   . Other chronic  postoperative pain   . Pneumonia    "probably 4-5 times" (05/09/2014)  . Polymyalgia rheumatica ([place])   . Renal insufficiency   . Sleep apnea    "lost weight; no more problem" (05/09/2014)  . Stroke ([place]) 01/10/06   denies residual on 05/09/2014  . Type II diabetes mellitus ([place]) dx'd 1995  . Ulcer ([place])    diabetic foot   . Unspecified hereditary and idiopathic peripheral neuropathy    feet  . Unspecified osteomyelitis, site unspecified   . Unspecified vitamin D deficiency     Past Surgical History:  Procedure Laterality Date  . AMPUTATION  01/21/2012   Procedure: AMPUTATION RAY;  Surgeon: [name], MD;  Location: [place];  Service: Orthopedics;  Laterality: Left;  Left Foot 4th Ray Amputation  . AMPUTATION Left 05/04/2013   Procedure: AMPUTATION DIGIT;  Surgeon: [name], MD;  Location: [place];  Service: Orthopedics;  Laterality: Left;  Left Great Toe Amputation at [facility]  . ANTERIOR CERVICAL DECOMP/DISCECTOMY FUSION  02/2011  . BASCILIC VEIN TRANSPOSITION Left 10/19/2012   Procedure: BASCILIC VEIN TRANSPOSITION;  Surgeon: Serafina Mitchell, MD;  Location: [place];  Service: Vascular;  Laterality: Left;  . EXCISIONAL TOTAL KNEE ARTHROPLASTY WITH ANTIBIOTIC SPACERS  Left 08/07/2014   Procedure: Replace Left Total Knee Arthroplasty,  Place Antibiotic Spacer;  Surgeon: [name], MD;  Location: [place];  Service: Orthopedics;  Laterality: Left;  . I&D EXTREMITY Left 05/09/2014   Procedure: Irrigation and Debridement Left Knee and Closure of Total Knee Arthroplasty Incision;  Surgeon: [name], MD;  Location: [place];  Service: Orthopedics;  Laterality: Left;  . I&D KNEE WITH POLY EXCHANGE Left 05/31/2014   Procedure: IRRIGATION AND DEBRIDEMENT LEFT KNEE, PLACE ANTIBIOTIC BEADS,  POLY EXCHANGE;  Surgeon: [name], MD;  Location: [place];  Service: Orthopedics;  Laterality: Left;  . KNEE ARTHROSCOPY Left 08-25-2012  . REFRACTIVE SURGERY Bilateral   . TOE AMPUTATION Bilateral    "I've  lost 7 toes over the last 7 years" (05/09/2014)  . TOE SURGERY Left April 2015   Big toe removed on left foot.  . TONSILLECTOMY    . TOTAL KNEE ARTHROPLASTY Left 04/10/2014   Procedure: TOTAL KNEE ARTHROPLASTY;  Surgeon: [name], MD;  Location: [place];  Service: Orthopedics;  Laterality: Left;  . TOTAL KNEE REVISION Left 10/25/2014   Procedure: LEFT TOTAL KNEE REVISION;  Surgeon: [name], MD;  Location: [place];  Service: Orthopedics;  Laterality: Left;  . UVULOPALATOPHARYNGOPLASTY, TONSILLECTOMY AND SEPTOPLASTY  ~ 1989  . WOUND DEBRIDEMENT Left 05/09/2014   Dehiscence Left Total Knee Arthroplasty Incision      Family History  Problem Relation Age of Onset  . Hypertension Mother   . Cancer Mother 59    Ovarian  . Heart disease Maternal Aunt   . Stroke Maternal Grandfather       reports that he has been smoking Cigarettes.  He has a 3.84 pack-year smoking history. He quit smokeless tobacco use about 13 months ago. He reports that he does not drink alcohol or use drugs.   Allergies  Allergen Reactions  . Morphine And Related Other (See Comments)    hallucinations  . Tygacil [Tigecycline] Nausea And Vomiting and Other (See Comments)    Makes him feel crazy    Prior to Admission medications   Medication Sig Start Date End Date Taking? Authorizing Provider  aspirin 81 MG EC tablet TAKE 1 TABLET EVERY DAY 08/05/15  Yes Gildardo Cranker, DO  atorvastatin (LIPITOR) 10 MG tablet Take one tablet by mouth once daily at 6pm for cholesterol. **Needs an appointment before anymore refills. 09/10/15  Yes Gildardo Cranker, DO  calcitRIOL (ROCALTROL) 1 MCG/ML solution Take 1 mcg by mouth every Monday, Wednesday, and Friday with hemodialysis. Calcitriol IV Injection active form of vitamin D.   Yes Historical Provider, MD  gabapentin (NEURONTIN) 300 MG capsule Take 1 capsule (300 mg total) by mouth 3 (three) times daily. 11/24/15  Yes Chrystie Nose, NP  montelukast (SINGULAIR) 10 MG tablet TAKE  1 TABLET (10 MG TOTAL) BY MOUTH AT BEDTIME. 09/01/15  Yes Gildardo Cranker, DO  multivitamin (RENA-VIT) TABS tablet Take 1 tablet by mouth daily.   Yes Historical Provider, MD  sevelamer carbonate (RENVELA) 800 MG tablet Take 800-3,200 mg by mouth 3 (three) times daily with meals. 800 mg-1600 mg with snacks (depends on dietary intake) and 2400 mg-3200 mg with meals   Yes Historical Provider, MD  temazepam (RESTORIL) 30 MG capsule Take 30 mg by mouth at bedtime as needed. for sleep 11/10/15  Yes Historical Provider, MD  albuterol (PROVENTIL HFA;VENTOLIN HFA) 108 (90 Base) MCG/ACT inhaler Inhale 2 puffs into the lungs every 6 (six) hours as needed  for wheezing or shortness of breath. 01/08/15   Gildardo Cranker, DO  Elastic Bandages & Supports (KNEE BRACE) MISC Use daily as directed by Orthopedic Specialist 09/03/15   Gildardo Cranker, DO  fluticasone [facility]) 50 MCG/ACT nasal spray Place 2 sprays into both nostrils daily. Patient not taking: Reported on 11/20/2015 10/09/14   Gildardo Cranker, DO  metoprolol tartrate (LOPRESSOR) 25 MG tablet TAKE 1/2 TABLET TWICE A DAY Patient not taking: Reported on 11/20/2015 08/05/15   Gildardo Cranker, DO  sorbitol 70 % solution Take 30-60 mLs by mouth daily as needed (for constipation).  06/04/15   Historical Provider, MD     Anti-infectives    Start     Dose/Rate Route Frequency Ordered Stop   11/26/15 1115  ceFAZolin (ANCEF) IVPB 1 g/50 mL premix     1 g 100 mL/hr over 30 Minutes Intravenous Every 6 hours 11/26/15 1102 11/26/15 2314   11/26/15 0600  ceFAZolin (ANCEF) IVPB 2g/100 mL premix     2 g 200 mL/hr over 30 Minutes Intravenous To Short Stay 11/25/15 1107 11/26/15 0913      Results for orders placed or performed during the hospital encounter of 11/26/15 (from the past 48 hour(s))  Glucose, capillary     Status: Abnormal   Collection Time: 11/26/15  7:21 AM  Result Value Ref Range   Glucose-Capillary 120 (H) 65 - 99 mg/dL   Comment 1 Notify RN    Comment 2  Document in Chart   I-STAT 4, (NA,K, GLUC, HGB,HCT)     Status: Abnormal   Collection Time: 11/26/15  7:47 AM  Result Value Ref Range   Sodium 138 135 - 145 mmol/L   Potassium 4.9 3.5 - 5.1 mmol/L   Glucose, Bld 119 (H) 65 - 99 mg/dL   HCT 29.0 (L) 39.0 - 52.0 %   Hemoglobin 9.9 (L) 13.0 - 17.0 g/dL  Prepare RBC     Status: None   Collection Time: 11/26/15  9:54 AM  Result Value Ref Range   Order Confirmation ORDER PROCESSED BY BLOOD BANK   Aerobic/Anaerobic Culture (surgical/deep wound)     Status: None (Preliminary result)   Collection Time: 11/26/15 10:39 AM  Result Value Ref Range   Specimen Description TISSUE LEFT KNEE    Special Requests DEEP LEFT KNEE SPECIMEN A    Gram Stain PENDING    Culture PENDING    Report Status PENDING   Glucose, capillary     Status: None   Collection Time: 11/26/15 10:58 AM  Result Value Ref Range   Glucose-Capillary 91 65 - 99 mg/dL  Basic metabolic panel     Status: Abnormal   Collection Time: 11/26/15 12:37 PM  Result Value Ref Range   Sodium 138 135 - 145 mmol/L   Potassium 5.2 (H) 3.5 - 5.1 mmol/L   Chloride 100 (L) 101 - 111 mmol/L   CO2 26 22 - 32 mmol/L   Glucose, Bld 165 (H) 65 - 99 mg/dL   BUN 61 (H) 6 - 20 mg/dL    Comment: REPEATED TO VERIFY   Creatinine, Ser 8.53 (H) 0.61 - 1.24 mg/dL    Comment: REPEATED TO VERIFY   Calcium 8.8 (L) 8.9 - 10.3 mg/dL   GFR calc non Af Amer 6 (L) >60 mL/min   GFR calc Af Amer 7 (L) >60 mL/min    Comment: (NOTE) The eGFR has been calculated using the CKD EPI equation. This calculation has not been validated in all clinical situations. eGFR's persistently <  60 mL/min signify possible Chronic Kidney Disease.    Anion gap 12 5 - 15    ROS:  Right knee discomfort only issue as positive .  Physical Exam: Vitals:   11/26/15 1355 11/26/15 1410  BP: (!) 103/52 (!) 102/29  Pulse: 89 88  Resp: 19 17  Temp:       General: alert WM In PACU NAD , OX3 ,Appropriate  HEENT: [place] MMM, EOMI,  Nonicteric  Neck: No jvd  Heart: RRR no rub, mur or gallop Lungs: CTA , nonlabored breathing Abdomen: BS pos. Soft, nt,nd  Extremities: no pedal edema / L KNEE with  wound vac in place  Skin: no overt rash , warm dry Neuro: alert. Appropriate, no overt focal deficits noted  Dialysis Access: LUA AVF Pos bruit   Dialysis Orders: [facility]: [facility]   on MWF . urf profile 4  EDW 91.5 kg HD Bath 2k, 2ca  Time 4hrs Heparin 4000. Access LUA AVF    Hec 2 mcg IV/HD   Mircera 50 mcg q 4 wks  Last given  10/29/15    Units IV/HD  Venofer  100 mg  q hd load( last dose 12/03/15) Then 5o mg weekly hd  Other op labs  hgb 10.8 11/19/15  Ca 10.3 phos 7.0 pth 154  Assessment/Plan 1. L TKR- per Dr. Sharol Given rx  2. ESRD -  K up sec  surgery / bleeding  With surgery  Hd today reck k  3. Hypertension/volume  -  uf 2 l  On hd ( 2 kg up per wt)  lowish bp in pacu sec ,bld, pain  meds , ?  / monitor  On floor / on 12.5 bid Lopressor as op  Hold for now  4. Anemia  - of blood loss and esrd/ esa last given 10/29/15  Will give with Friday hd Aransep  143mg  /contiune venofer load  As above op hd  5. Metabolic bone disease -   Iv hec on hd and binder with meals  DErnest Haber PA-C [facility] Beeper [phone]/15/2017, 2:57 PM   Pt seen, examined and agree w A/P as above. ESRD pt with hx CVA, DM here for revision of L TKR.  Peri-op hypotension, drop in Hb, ^Raliegh Ip  Plan HD this afternoon, small UF as tol.  Up 5 kg. NO distress. Will follow.  RKelly SplinterMD CNewell Rubbermaidpager [phone]  11/26/2015, 5:13 PM

## 2015-11-26 NOTE — Op Note (Signed)
DATE OF SURGERY:  11/26/2015  TIME: 10:48 AM  PATIENT NAME:  Matthew Kline    AGE: 55 y.o.    PRE-OPERATIVE DIAGNOSIS:  Unstable Left Total Knee Arthroplasty  POST-OPERATIVE DIAGNOSIS:  Unstable Left Total Knee Arthroplasty  PROCEDURE:  Procedure(s): Removal Left Total Knee Arthroplasty,  Hinged Total Knee Arthroplasty left Placement of Prevena wound VAC.   SURGEON: Meridee Score  ASSISTANT: April Green  OPERATIVE IMPLANTS: Depuy , hinged total. Knee.  Femur size small, Tibia revision rotating platform size 4, , with a 16 mm revision rotating mm polyethylene insert. 75 mm x 14 mm tibial fluted stem, 31 mm fully porous-coated femoral sleeve, 75 mm x 16 mm fluted femoral stem   PREOPERATIVE INDICATIONS:   Matthew Kline is a 55 y.o. year old male with a unstable total knee arthroplasty left. Patient had acute trauma sustaining injury to the collateral ligaments and subsequent had an unstable total knee. Patient tried conservative therapy and failed conservative therapy and presents at this time for revision of the total knee Arthroplasty. Patient is status post total knee arthroplasty had acute trauma with unstable ligamentous construct. Patient has failed conservative treatment and presents at this time for revision of an unstable total knee to a constrained total knee.   The risks, benefits, and alternatives were discussed at length including but not limited to the risks of infection, bleeding, nerve injury, stiffness, blood clots, the need for revision surgery, cardiopulmonary complications, among others, and they were willing to proceed.  OPERATIVE DESCRIPTION:  The patient was brought to the operative room and placed in a supine position.  General anesthesia was administered.  IV antibiotics were given.  The lower extremity was prepped and draped in the usual sterile fashion.  Charlie Pitter was used to cover all exposed skin. Time out was performed.    Anterior medial  quadriceps tendon splitting approach was performed.  The patella was everted and osteophytes were removed.  A oscillating saw was used to remove the femoral and tibial components. These were removed without complication without any bone loss.   The distal femur and the tibia were sequentially reamed. The tibia was reamed up to 14 mm in diameter and the femur was reamed up to 16 mm in diameter. Using the intramedullary guide of the tibia the resection block was set to take 2 mm off the proximal tibia this was sized for a size 4 and the sleeve and broach cuts were made for the size 4 tibia. The intramedullary guide was placed in the femur and the distal cut was made 7 of valgus with 2 mm taken off the distal femur. The distal jig was then used and tox cuts and chamfer cuts were made off the distal femur. Trial components were placed and the knee was stable in full extension with range of motion with the 16 mm polyethylene tray.    The knee was irrigated with normal saline hole loose material was removed. The knee was soaked in [place] a for hemostasis.  The patella had a stable patella implant however it had subluxed laterally with contraction the lateral capsule and a lateral release was performed to allow midline tracking of the patella. The knee was found to have stable balance and full motion. The knee was placed through a range of motion with the trial implants and the knee was stable. The knee was irrigated with normal saline and the knee was soaked with TXA.  The final implants were placed with the most proximal aspect of  the components cemented the fluted stem and porous-coated sleeves were not cemented. The tibial and then femoral component was cemented in place and impacted loose cement was removed and the hinged knee was then placed stabilized with the crossing pin in the knee was left in extension until the cement hardened. The knee was placed through range of motion the patella tracked midline. The  retinaculum was closed using #1 Vicryl. The skin was closed using 2-0 nylon. A Prevena wound VAC was applied. Patient was extubated taken to the PACU in stable condition.  Patient received 2 units of packed red blood cells.

## 2015-11-26 NOTE — Anesthesia Procedure Notes (Signed)
Procedure Name: Intubation Date/Time: 11/26/2015 8:38 AM Performed by: Oletta Lamas Pre-anesthesia Checklist: Patient identified, Emergency Drugs available, Suction available and Patient being monitored Patient Re-evaluated:Patient Re-evaluated prior to inductionOxygen Delivery Method: Circle System Utilized Preoxygenation: Pre-oxygenation with 100% oxygen Intubation Type: IV induction Ventilation: Mask ventilation without difficulty Laryngoscope Size: Mac and 3 Grade View: Grade I Tube type: Oral Number of attempts: 1 Airway Equipment and Method: Stylet Placement Confirmation: ETT inserted through vocal cords under direct vision,  positive ETCO2 and breath sounds checked- equal and bilateral Secured at: 23 cm Tube secured with: Tape Dental Injury: Teeth and Oropharynx as per pre-operative assessment

## 2015-11-26 NOTE — Brief Op Note (Signed)
11/26/2015  11:07 AM  PATIENT:  Matthew Kline  55 y.o. male  PRE-OPERATIVE DIAGNOSIS:  Unstable Left Total Knee Arthroplasty  POST-OPERATIVE DIAGNOSIS:  Unstable Left Total Knee Arthroplasty  PROCEDURE:  Procedure(s): Removal Left Total Knee Arthroplasty, Hinged Total Knee Arthroplasty (Left)  SURGEON:  Surgeon(s) and Role:    [name], MD - Primary  PHYSICIAN ASSISTANT:   ASSISTANTS: April Green   ANESTHESIA:   general  EBL:  Total I/O In: 2370 [I.V.:950; Blood:670; IV Piggyback:750] Out: 1300 [Blood:1300]  BLOOD ADMINISTERED:Patient received 2 units packed red blood cells CC PRBC  DRAINS: Wound VAC incisional applied   LOCAL MEDICATIONS USED:  NONE  SPECIMEN:  Source of Specimen:  Deep tissue was obtained for cultures.  DISPOSITION OF SPECIMEN:  N/A  COUNTS:  YES  TOURNIQUET:   Total Tourniquet Time Documented: Thigh (Left) - 60 minutes Total: Thigh (Left) - 60 minutes   DICTATION: .Viviann Spare Dictation  PLAN OF CARE: Admit to inpatient   PATIENT DISPOSITION:  PACU - hemodynamically stable.   Delay start of Pharmacological VTE agent (>24hrs) due to surgical blood loss or risk of bleeding: No

## 2015-11-26 NOTE — H&P (Addendum)
Matthew Kline is an 55 y.o. male.   Chief Complaint: Left knee pain HPI: Patient is a 55 year old gentleman who sustained acute trauma to his revision total knee arthroplasty. Patient had unstable collateral ligaments has failed conservative care and presents at this time for revision of the total knee arthroplasty.. Patient has previously undergone revision of the total knee arthroplasty for acute dehiscence from a fall. Patient underwent antibiotic placement IV antibiotics and revision of the total knee arthroplasty after placement of antibiotic spacer. With patient's unstable total knee arthroplasty patient presents at this time for revision.  Past Medical History:  Diagnosis Date  . Allergy   . Anemia, unspecified   . Anxiety   . Arthralgia 2010   polyarticular  . Arthritis    "back, knees" (05/09/2014)  . CHF (congestive heart failure) ([place])   . CHF (congestive heart failure) ([place]) 07/25/2009   denies  . Chronic lower back pain   . Coughing    pt. reports that he has drainage from sinus infection  . Diabetic neuropathy ([place])   . ESRD (end stage renal disease) on dialysis [facility])    started 12/2012; "MWF; [place] "   . GERD (gastroesophageal reflux disease)    hx "before I lost weight", no problem 9 years  . Hemodialysis access site with mature fistula ([place])   . Hemorrhoids, internal 10/2011   small  . History of blood transfusion    "related to the anemia"  . Hypertension   . Insomnia, unspecified   . Knee pain, left   . Lacunar infarction ([place]) 2006   RUE/RLE, speech  . Long term (current) use of anticoagulants   . Myocardial infarction 1995  . Orthostatic hypotension   . Osteomyelitis of foot, left, acute ([place])   . Other chronic postoperative pain   . Pneumonia    "probably 4-5 times" (05/09/2014)  . Polymyalgia rheumatica ([place])   . Renal insufficiency   . Sleep apnea    "lost weight; no more problem" (05/09/2014)  . Stroke ([place]) 01/10/06   denies residual  on 05/09/2014  . Type II diabetes mellitus ([place]) dx'd 1995  . Ulcer ([place])    diabetic foot   . Unspecified hereditary and idiopathic peripheral neuropathy    feet  . Unspecified osteomyelitis, site unspecified   . Unspecified vitamin D deficiency     Past Surgical History:  Procedure Laterality Date  . AMPUTATION  01/21/2012   Procedure: AMPUTATION RAY;  Surgeon: [name], MD;  Location: [place];  Service: Orthopedics;  Laterality: Left;  Left Foot 4th Ray Amputation  . AMPUTATION Left 05/04/2013   Procedure: AMPUTATION DIGIT;  Surgeon: [name], MD;  Location: [place];  Service: Orthopedics;  Laterality: Left;  Left Great Toe Amputation at [facility]  . ANTERIOR CERVICAL DECOMP/DISCECTOMY FUSION  02/2011  . BASCILIC VEIN TRANSPOSITION Left 10/19/2012   Procedure: BASCILIC VEIN TRANSPOSITION;  Surgeon: Serafina Mitchell, MD;  Location: [place];  Service: Vascular;  Laterality: Left;  . EXCISIONAL TOTAL KNEE ARTHROPLASTY WITH ANTIBIOTIC SPACERS Left 08/07/2014   Procedure: Replace Left Total Knee Arthroplasty,  Place Antibiotic Spacer;  Surgeon: [name], MD;  Location: [place];  Service: Orthopedics;  Laterality: Left;  . I&D EXTREMITY Left 05/09/2014   Procedure: Irrigation and Debridement Left Knee and Closure of Total Knee Arthroplasty Incision;  Surgeon: [name], MD;  Location: [place];  Service: Orthopedics;  Laterality: Left;  . I&D KNEE WITH POLY EXCHANGE Left 05/31/2014  Procedure: IRRIGATION AND DEBRIDEMENT LEFT KNEE, PLACE ANTIBIOTIC BEADS,  POLY EXCHANGE;  Surgeon: [name], MD;  Location: [place];  Service: Orthopedics;  Laterality: Left;  . KNEE ARTHROSCOPY Left 08-25-2012  . REFRACTIVE SURGERY Bilateral   . TOE AMPUTATION Bilateral    "I've lost 7 toes over the last 7 years" (05/09/2014)  . TOE SURGERY Left April 2015   Big toe removed on left foot.  . TONSILLECTOMY    . TOTAL KNEE ARTHROPLASTY Left 04/10/2014   Procedure: TOTAL KNEE ARTHROPLASTY;  Surgeon: [name],  MD;  Location: [place];  Service: Orthopedics;  Laterality: Left;  . TOTAL KNEE REVISION Left 10/25/2014   Procedure: LEFT TOTAL KNEE REVISION;  Surgeon: [name], MD;  Location: [place];  Service: Orthopedics;  Laterality: Left;  . UVULOPALATOPHARYNGOPLASTY, TONSILLECTOMY AND SEPTOPLASTY  ~ 1989  . WOUND DEBRIDEMENT Left 05/09/2014   Dehiscence Left Total Knee Arthroplasty Incision    Family History  Problem Relation Age of Onset  . Hypertension Mother   . Cancer Mother 30    Ovarian  . Heart disease Maternal Aunt   . Stroke Maternal Grandfather    Social History:  reports that he has been smoking Cigarettes.  He has a 3.84 pack-year smoking history. He quit smokeless tobacco use about 13 months ago. He reports that he does not drink alcohol or use drugs.  Allergies:  Allergies  Allergen Reactions  . Morphine And Related Other (See Comments)    hallucinations  . Tygacil [Tigecycline] Nausea And Vomiting and Other (See Comments)    Makes him feel crazy    Medications Prior to Admission  Medication Sig Dispense Refill  . albuterol (PROVENTIL HFA;VENTOLIN HFA) 108 (90 Base) MCG/ACT inhaler Inhale 2 puffs into the lungs every 6 (six) hours as needed for wheezing or shortness of breath. 1 Inhaler 6  . aspirin 81 MG EC tablet TAKE 1 TABLET EVERY DAY 30 tablet 6  . atorvastatin (LIPITOR) 10 MG tablet Take one tablet by mouth once daily at 6pm for cholesterol. **Needs an appointment before anymore refills. 30 tablet 0  . calcitRIOL (ROCALTROL) 1 MCG/ML solution Take 1 mcg by mouth every Monday, Wednesday, and Friday with hemodialysis. Calcitriol IV Injection active form of vitamin D.    . montelukast (SINGULAIR) 10 MG tablet TAKE 1 TABLET (10 MG TOTAL) BY MOUTH AT BEDTIME. 90 tablet 1  . multivitamin (RENA-VIT) TABS tablet Take 1 tablet by mouth daily.    . sevelamer carbonate (RENVELA) 800 MG tablet Take 800-3,200 mg by mouth 3 (three) times daily with meals. 800 mg-1600 mg with snacks  (depends on dietary intake) and 2400 mg-3200 mg with meals    . sorbitol 70 % solution Take 30-60 mLs by mouth daily as needed (for constipation).   5  . temazepam (RESTORIL) 30 MG capsule Take 30 mg by mouth at bedtime as needed. for sleep  2  . Elastic Bandages & Supports (KNEE BRACE) MISC Use daily as directed by Orthopedic Specialist 1 each 0  . fluticasone (FLONASE) 50 MCG/ACT nasal spray Place 2 sprays into both nostrils daily. (Patient not taking: Reported on 11/20/2015) 16 g 6  . gabapentin (NEURONTIN) 300 MG capsule Take 1 capsule (300 mg total) by mouth 3 (three) times daily. 270 capsule 0  . metoprolol tartrate (LOPRESSOR) 25 MG tablet TAKE 1/2 TABLET TWICE A DAY (Patient not taking: Reported on 11/20/2015) 30 tablet 6    Results for orders placed or performed during the  hospital encounter of 11/24/15 (from the past 48 hour(s))  Glucose, capillary     Status: Abnormal   Collection Time: 11/24/15  2:19 PM  Result Value Ref Range   Glucose-Capillary 100 (H) 65 - 99 mg/dL  Type and screen [place]     Status: None   Collection Time: 11/24/15  2:55 PM  Result Value Ref Range   ABO/RH(D) O NEG    Antibody Screen NEG    Sample Expiration 11/27/2015   Surgical pcr screen     Status: Abnormal   Collection Time: 11/24/15  3:33 PM  Result Value Ref Range   MRSA, PCR POSITIVE (A) NEGATIVE    Comment: RESULT CALLED TO, READ BACK BY AND VERIFIED WITH: York Spaniel RN 17:45 11/24/15 ([email or web address])    Staphylococcus aureus POSITIVE (A) NEGATIVE    Comment:        The Xpert SA Assay (FDA approved for NASAL specimens in patients over 25 years of age), is one component of a comprehensive surveillance program.  Test performance has been validated by [facility] for patients greater than or equal to 64 year old. It is not intended to diagnose infection nor to guide or monitor treatment.   CBC     Status: Abnormal   Collection Time: 11/24/15  3:34 PM  Result Value Ref  Range   WBC 7.9 4.0 - 10.5 K/uL   RBC 3.67 (L) 4.22 - 5.81 MIL/uL   Hemoglobin 10.9 (L) 13.0 - 17.0 g/dL   HCT 33.5 (L) 39.0 - 52.0 %   MCV 91.3 78.0 - 100.0 fL   MCH 29.7 26.0 - 34.0 pg   MCHC 32.5 30.0 - 36.0 g/dL   RDW 14.4 11.5 - 15.5 %   Platelets 206 150 - 400 K/uL  Basic metabolic panel     Status: Abnormal   Collection Time: 11/24/15  3:34 PM  Result Value Ref Range   Sodium 138 135 - 145 mmol/L   Potassium 4.1 3.5 - 5.1 mmol/L   Chloride 92 (L) 101 - 111 mmol/L   CO2 33 (H) 22 - 32 mmol/L   Glucose, Bld 128 (H) 65 - 99 mg/dL   BUN 27 (H) 6 - 20 mg/dL   Creatinine, Ser 4.69 (H) 0.61 - 1.24 mg/dL   Calcium 8.8 (L) 8.9 - 10.3 mg/dL   GFR calc non Af Amer 13 (L) >60 mL/min   GFR calc Af Amer 15 (L) >60 mL/min    Comment: (NOTE) The eGFR has been calculated using the CKD EPI equation. This calculation has not been validated in all clinical situations. eGFR's persistently <60 mL/min signify possible Chronic Kidney Disease.    Anion gap 13 5 - 15  Hemoglobin A1c     Status: Abnormal   Collection Time: 11/24/15  3:34 PM  Result Value Ref Range   Hgb A1c MFr Bld 6.6 (H) 4.8 - 5.6 %    Comment: (NOTE)         Pre-diabetes: 5.7 - 6.4         Diabetes: >6.4         Glycemic control for adults with diabetes: <7.0    Mean Plasma Glucose 143 mg/dL    Comment: (NOTE) Performed At: [facility] [place], [place] [id number] Lindon Romp MD [id number]    No results found.  Review of Systems  All other systems reviewed and are negative.   There were no vitals taken for this visit. Physical  Exam  Patient is alert oriented no adenopathy well-dressed normal affect normal historian effort he does have an antalgic gait. Examination surgical is well-healed there is no cellulitis no drainage no signs of infection. Assessment/Plan Assessment: Left knee status post 2 stage treatment for infected total knee arthroplasty.  Plan: We'll plan for removal  the antibiotic spacer components both tibial and femoral. Placement of a constrained versus hinged total knee arthroplasty. Risk and benefits were discussed including risk of potential recurrent infection. Patient states she understands wish to proceed at this time.  [name], MD 11/26/2015, 6:49 AM

## 2015-11-26 NOTE — Anesthesia Postprocedure Evaluation (Signed)
Anesthesia Post Note  Patient: Matthew Kline  Procedure(s) Performed: Procedure(s) (LRB): Removal Left Total Knee Arthroplasty, Hinged Total Knee Arthroplasty (Left)  Anesthesia Type: General Level of consciousness: awake Pain management: pain level controlled Vital Signs Assessment: post-procedure vital signs reviewed and stable Cardiovascular status: stable Anesthetic complications: no    Last Vitals:  Vitals:   11/26/15 1605 11/26/15 1630  BP: (!) 143/90 (!) 84/44  Pulse: 81 95  Resp: (!) 9 (!) 30  Temp:      Last Pain:  Vitals:   11/26/15 1545  TempSrc: Oral  PainSc: 9                  EDWARDS,Kensley Valladares

## 2015-11-26 NOTE — Anesthesia Preprocedure Evaluation (Addendum)
Anesthesia Evaluation  Patient identified by MRN, date of birth, ID band Patient awake    Reviewed: Allergy & Precautions, NPO status   Airway Mallampati: II  TM Distance: >3 FB     Dental   Pulmonary shortness of breath, sleep apnea , pneumonia, Current Smoker,    breath sounds clear to auscultation       Cardiovascular hypertension, + Past MI, + Peripheral Vascular Disease and +CHF   Rhythm:Regular Rate:Normal     Neuro/Psych    GI/Hepatic GERD  ,  Endo/Other  diabetes  Renal/GU ESRF and DialysisRenal disease     Musculoskeletal   Abdominal   Peds  Hematology  (+) anemia ,   Anesthesia Other Findings   Reproductive/Obstetrics                            Anesthesia Physical Anesthesia Plan  ASA: III  Anesthesia Plan: General   Post-op Pain Management:    Induction: Intravenous  Airway Management Planned: Oral ETT  Additional Equipment:   Intra-op Plan:   Post-operative Plan: Extubation in OR  Informed Consent: I have reviewed the patients History and Physical, chart, labs and discussed the procedure including the risks, benefits and alternatives for the proposed anesthesia with the patient or authorized representative who has indicated his/her understanding and acceptance.   Dental advisory given  Plan Discussed with: CRNA and Anesthesiologist  Anesthesia Plan Comments:         Anesthesia Quick Evaluation

## 2015-11-26 NOTE — Procedures (Signed)
  I was present at this dialysis session, have reviewed the session itself and made  appropriate changes Kelly Splinter MD [place] pager [phone]   11/26/2015, 5:16 PM

## 2015-11-26 NOTE — Transfer of Care (Signed)
Immediate Anesthesia Transfer of Care Note  Patient: Matthew Kline  Procedure(s) Performed: Procedure(s): Removal Left Total Knee Arthroplasty, Hinged Total Knee Arthroplasty (Left)  Patient Location: PACU  Anesthesia Type:General  Level of Consciousness: awake, alert , oriented and patient cooperative  Airway & Oxygen Therapy: Patient Spontanous Breathing and Patient connected to nasal cannula oxygen  Post-op Assessment: Report given to RN and Post -op Vital signs reviewed and stable  Post vital signs: Reviewed and stable  Last Vitals:  Vitals:   11/26/15 0716  BP: (!) 172/87  Pulse: 86  Resp: 18  Temp: 36.8 C    Last Pain:  Vitals:   11/26/15 0716  TempSrc: Oral      Patients Stated Pain Goal: 3 (43/88/87 5797)  Complications: No apparent anesthesia complications

## 2015-11-27 ENCOUNTER — Encounter (HOSPITAL_COMMUNITY): Payer: Self-pay | Admitting: *Deleted

## 2015-11-27 DIAGNOSIS — T8454XD Infection and inflammatory reaction due to internal left knee prosthesis, subsequent encounter: Secondary | ICD-10-CM

## 2015-11-27 DIAGNOSIS — Z823 Family history of stroke: Secondary | ICD-10-CM

## 2015-11-27 DIAGNOSIS — Z885 Allergy status to narcotic agent status: Secondary | ICD-10-CM

## 2015-11-27 DIAGNOSIS — Z8041 Family history of malignant neoplasm of ovary: Secondary | ICD-10-CM

## 2015-11-27 DIAGNOSIS — Z881 Allergy status to other antibiotic agents status: Secondary | ICD-10-CM

## 2015-11-27 DIAGNOSIS — Z8249 Family history of ischemic heart disease and other diseases of the circulatory system: Secondary | ICD-10-CM

## 2015-11-27 DIAGNOSIS — E1140 Type 2 diabetes mellitus with diabetic neuropathy, unspecified: Secondary | ICD-10-CM

## 2015-11-27 DIAGNOSIS — F17210 Nicotine dependence, cigarettes, uncomplicated: Secondary | ICD-10-CM

## 2015-11-27 DIAGNOSIS — E1122 Type 2 diabetes mellitus with diabetic chronic kidney disease: Secondary | ICD-10-CM

## 2015-11-27 DIAGNOSIS — N186 End stage renal disease: Secondary | ICD-10-CM

## 2015-11-27 LAB — TYPE AND SCREEN
ABO/RH(D): O NEG
Antibody Screen: NEGATIVE
UNIT DIVISION: 0
Unit division: 0
Unit division: 0
Unit division: 0

## 2015-11-27 LAB — CBC WITH DIFFERENTIAL/PLATELET
BASOS ABS: 0 10*3/uL (ref 0.0–0.1)
Basophils Relative: 1 %
EOS PCT: 1 %
Eosinophils Absolute: 0 10*3/uL (ref 0.0–0.7)
HEMATOCRIT: 25.9 % — AB (ref 39.0–52.0)
Hemoglobin: 8.7 g/dL — ABNORMAL LOW (ref 13.0–17.0)
LYMPHS ABS: 2.1 10*3/uL (ref 0.7–4.0)
LYMPHS PCT: 24 %
MCH: 29.7 pg (ref 26.0–34.0)
MCHC: 33.6 g/dL (ref 30.0–36.0)
MCV: 88.4 fL (ref 78.0–100.0)
MONO ABS: 1 10*3/uL (ref 0.1–1.0)
Monocytes Relative: 11 %
NEUTROS ABS: 5.6 10*3/uL (ref 1.7–7.7)
Neutrophils Relative %: 64 %
PLATELETS: 119 10*3/uL — AB (ref 150–400)
RBC: 2.93 MIL/uL — AB (ref 4.22–5.81)
RDW: 15.7 % — AB (ref 11.5–15.5)
WBC: 8.9 10*3/uL (ref 4.0–10.5)

## 2015-11-27 LAB — GLUCOSE, CAPILLARY
GLUCOSE-CAPILLARY: 115 mg/dL — AB (ref 65–99)
GLUCOSE-CAPILLARY: 133 mg/dL — AB (ref 65–99)
Glucose-Capillary: 124 mg/dL — ABNORMAL HIGH (ref 65–99)
Glucose-Capillary: 90 mg/dL (ref 65–99)

## 2015-11-27 MED ORDER — CEFAZOLIN IN D5W 1 GM/50ML IV SOLN
1.0000 g | INTRAVENOUS | Status: DC
  Filled 2015-11-27: qty 50

## 2015-11-27 MED ORDER — SODIUM CHLORIDE 0.9 % IV SOLN
125.0000 mg | INTRAVENOUS | Status: DC
  Administered 2015-11-28: 125 mg via INTRAVENOUS
  Filled 2015-11-27 (×3): qty 10

## 2015-11-27 MED ORDER — VANCOMYCIN HCL 10 G IV SOLR
2000.0000 mg | Freq: Once | INTRAVENOUS | Status: AC
  Administered 2015-11-27: 2000 mg via INTRAVENOUS
  Filled 2015-11-27 (×2): qty 2000

## 2015-11-27 MED ORDER — HYDROMORPHONE HCL 1 MG/ML IJ SOLN
INTRAMUSCULAR | Status: AC
  Filled 2015-11-27: qty 1

## 2015-11-27 MED ORDER — GABAPENTIN 300 MG PO CAPS
300.0000 mg | ORAL_CAPSULE | Freq: Two times a day (BID) | ORAL | Status: DC
  Administered 2015-11-28 – 2015-12-01 (×7): 300 mg via ORAL
  Filled 2015-11-27 (×7): qty 1

## 2015-11-27 MED ORDER — DEXTROSE 5 % IV SOLN
2.0000 g | Freq: Once | INTRAVENOUS | Status: AC
  Administered 2015-11-27: 2 g via INTRAVENOUS
  Filled 2015-11-27: qty 2

## 2015-11-27 MED ORDER — DARBEPOETIN ALFA 100 MCG/0.5ML IJ SOSY
100.0000 ug | PREFILLED_SYRINGE | INTRAMUSCULAR | Status: DC
  Administered 2015-11-28: 100 ug via INTRAVENOUS
  Filled 2015-11-27: qty 0.5

## 2015-11-27 MED ORDER — MUPIROCIN 2 % EX OINT
1.0000 "application " | TOPICAL_OINTMENT | Freq: Two times a day (BID) | CUTANEOUS | Status: DC
  Administered 2015-11-27 – 2015-12-01 (×9): 1 via NASAL
  Filled 2015-11-27 (×3): qty 22

## 2015-11-27 MED ORDER — DEXTROSE 5 % IV SOLN
2.0000 g | INTRAVENOUS | Status: DC
  Administered 2015-11-28: 2 g via INTRAVENOUS
  Filled 2015-11-27 (×3): qty 2

## 2015-11-27 MED ORDER — CHLORHEXIDINE GLUCONATE CLOTH 2 % EX PADS
6.0000 | MEDICATED_PAD | Freq: Every day | CUTANEOUS | Status: AC
  Administered 2015-11-27 – 2015-12-01 (×5): 6 via TOPICAL

## 2015-11-27 MED ORDER — VANCOMYCIN HCL IN DEXTROSE 1-5 GM/200ML-% IV SOLN
1000.0000 mg | INTRAVENOUS | Status: DC
  Administered 2015-11-28: 1000 mg via INTRAVENOUS
  Filled 2015-11-27 (×2): qty 200

## 2015-11-27 MED ORDER — CEFAZOLIN SODIUM-DEXTROSE 2-4 GM/100ML-% IV SOLN
2.0000 g | Freq: Two times a day (BID) | INTRAVENOUS | Status: DC
  Filled 2015-11-27: qty 100

## 2015-11-27 NOTE — Progress Notes (Signed)
Pharmacy Antibiotic Note  Matthew Kline is a 55 y.o. male admitted on 11/26/2015 for revision of L TKA. 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 ID also on board. Broadening antibiotics for prosthetic joint infection. Pt is ESRD on HD-MWF. Currently afebrile, WBC 8.9.   Plan: -Vancomycin 2 g IV x1 then 1 g IV qHD-MWF -Monitor HD schedule, tolerance, cultures/susceptibilities -VR as needed -Continue cefazolin 1 g IV q24h   Weight: 215 lb 2.7 oz (97.6 kg)  Temp (24hrs), Avg:98.6 F (37 C), Min:98.4 F (36.9 C), Max:98.8 F (37.1 C)   Recent Labs Lab 11/24/15 1534 11/26/15 1237 11/26/15 1458 11/26/15 1854 11/27/15 0628  WBC 7.9  --  9.5 5.5 8.9  CREATININE 4.69* 8.53*  --  5.88*  --     Estimated Creatinine Clearance: 16.7 mL/min (by C-G formula based on SCr of 5.88 mg/dL (H)).    Allergies  Allergen Reactions  . Morphine And Related Other (See Comments)    hallucinations  . Tygacil [Tigecycline] Nausea And Vomiting and Other (See Comments)    Makes him feel crazy    Antimicrobials this admission: 11/16 vancomycin > 11/15 ancef >   Dose adjustments this admission: NA   Microbiology results: 11/15 L knee tissue: rare GNR   Thank you for allowing pharmacy to be a part of this patient's care.   Harvel Quale 11/27/2015 11:04 AM

## 2015-11-27 NOTE — Progress Notes (Signed)
[facility] Progress Note   Subjective: looks better today, no new c/o, got partial HD yest due to post op pain and clotted system  Vitals:   11/27/15 1322 11/27/15 1400 11/27/15 1430 11/27/15 1500  BP: (!) 99/55 (!) 89/51 (!) 79/49 (!) 80/45  Pulse: 96 96 98 96  Resp:      Temp:      TempSrc:      SpO2:   95%   Weight:        Inpatient medications: . sodium chloride   Intravenous Once  . aspirin EC  325 mg Oral Q breakfast  . [START ON 11/28/2015] calcitRIOL  1 mcg Oral Q M,W,F-HD  . Chlorhexidine Gluconate Cloth  6 each Topical Q0600  . docusate sodium  100 mg Oral BID  . gabapentin  300 mg Oral TID  . insulin aspart  0-15 Units Subcutaneous TID WC  . montelukast  10 mg Oral QHS  . multivitamin  1 tablet Oral QHS  . mupirocin ointment  1 application Nasal BID  . sevelamer carbonate  2,400 mg Oral TID WC  . vancomycin  2,000 mg Intravenous Once  . [START ON 11/28/2015] vancomycin  1,000 mg Intravenous Q M,W,F-HD   . sodium chloride 10 mL/hr at 11/26/15 2215   acetaminophen **OR** acetaminophen, albuterol, bisacodyl, HYDROmorphone (DILAUDID) injection, magnesium citrate, menthol-cetylpyridinium **OR** phenol, methocarbamol **OR** methocarbamol (ROBAXIN)  IV, metoCLOPramide **OR** metoCLOPramide (REGLAN) injection, ondansetron **OR** ondansetron (ZOFRAN) IV, oxyCODONE, polyethylene glycol, sevelamer carbonate, temazepam  Exam: Awake alert, no distress No jvd Chest clear bilat RRR no mrg Abd sfot ntnd no ascites +bs Ext L knee w wound vac, no edema NF Ox3 LUA AVF +bruit  Dialysis: [facility] MWF  4h  91.5kg  2/2 bath  Hep 4000  LUA AVF  - Hect 2 ug tiw  - Mircera 50 ug q 2wks (last 10/18)  - Venofer 100/ hd  (last 11/22)  - Hb 10.8  CA 10.3 P 7 pth 154      Assessment: 1. L TKR revision POD #1, cx's +GNR 2. ESRD 3. HTN bp's soft, holding MTP 4. Vol is up 4 kg 5. Anemia blood loss  Plan - short HD today to complete yesterday's session. Next HD Fri or  Sat   [facility] pager [phone]   11/27/2015, 3:17 PM    Recent Labs Lab 11/24/15 1534  11/26/15 1237 11/26/15 1429 11/26/15 1854  NA 138  < > 138 138 136  K 4.1  < > 5.2* 6.0* 4.7  CL 92*  --  100*  --  99*  CO2 33*  --  26  --  25  GLUCOSE 128*  < > 165* 134* 117*  BUN 27*  --  61*  --  40*  CREATININE 4.69*  --  8.53*  --  5.88*  CALCIUM 8.8*  --  8.8*  --  8.7*  PHOS  --   --   --   --  4.4  < > = values in this interval not displayed.  Recent Labs Lab 11/26/15 1854  ALBUMIN 3.3*    Recent Labs Lab 11/26/15 1458 11/26/15 1854 11/27/15 0628  WBC 9.5 5.5 8.9  NEUTROABS  --   --  5.6  HGB 7.4* 8.5* 8.7*  HCT 22.2* 25.2* 25.9*  MCV 89.5 87.2 88.4  PLT 125* QUESTIONABLE RESULTS, RECOMMEND RECOLLECT TO VERIFY 119*   Iron/TIBC/Ferritin/ %Sat No results found for: IRON, TIBC, FERRITIN, IRONPCTSAT

## 2015-11-27 NOTE — Evaluation (Signed)
Physical Therapy Evaluation Patient Details Name: Matthew Kline MRN: [id number] DOB: 25-Sep-1960 Today's Date: 11/27/2015   History of Present Illness   Patient is a 55 year old gentleman who sustained acute trauma to his revision total knee arthroplasty. Patient had unstable collateral ligaments has failed conservative care and presents at this time for revision of the total knee arthroplasty.  S/P TKA with hinged knee.  Clinical Impression  Pt admitted with/for revision of revised L total knee with VAC placement and abx beads.  Pt is presently at min guard to supervision level.  Pt currently limited functionally due to the problems listed below.  (see problems list.)  Pt will benefit from PT to maximize function and safety to be able to get home safely with available assist of family.     Follow Up Recommendations Home health PT    Equipment Recommendations  None recommended by PT    Recommendations for Other Services       Precautions / Restrictions Precautions Precautions: Fall (minimal risk) Precaution Comments: has VAC/dressing Restrictions Weight Bearing Restrictions: Yes LLE Weight Bearing: Weight bearing as tolerated      Mobility  Bed Mobility Overal bed mobility: Needs Assistance Bed Mobility: Supine to Sit;Sit to Supine     Supine to sit: Supervision Sit to supine: Supervision      Transfers Overall transfer level: Needs assistance   Transfers: Sit to/from Stand Sit to Stand: Supervision            Ambulation/Gait Ambulation/Gait assistance: Min guard Ambulation Distance (Feet): 30 Feet Assistive device: Rolling walker (2 wheeled) Gait Pattern/deviations: Step-through pattern   Gait velocity interpretation: Below normal speed for age/gender General Gait Details: pt able to put partial weight on L LE with moderately heavy use of the rW  Stairs            Wheelchair Mobility    Modified Rankin (Stroke Patients Only)        Balance Overall balance assessment: Needs assistance   Sitting balance-Leahy Scale: Normal     Standing balance support: Bilateral upper extremity supported Standing balance-Leahy Scale: Fair Standing balance comment: static without assist , but heavier use of the RW to ambulate                             Pertinent Vitals/Pain Pain Assessment: 0-10 Pain Score: 6  Pain Location: knee Pain Descriptors / Indicators: Aching;Sore Pain Intervention(s): Monitored during session;Repositioned    Home Living Family/patient expects to be discharged to:: Private residence Living Arrangements: Spouse/significant other;Children Available Help at Discharge: Family;Available PRN/intermittently;Other (Comment) (most of the time.) Type of Home: House Home Access: Stairs to enter Entrance Stairs-Rails: Psychiatric nurse of Steps: 2 Home Layout: One level Home Equipment: Walker - 2 wheels      Prior Function Level of Independence: Independent;Independent with assistive device(s)               Hand Dominance        Extremity/Trunk Assessment   Upper Extremity Assessment: Defer to OT evaluation                     Communication   Communication: No difficulties  Cognition Arousal/Alertness: Awake/alert Behavior During Therapy: WFL for tasks assessed/performed Overall Cognitive Status: Within Functional Limits for tasks assessed                      General Comments  Exercises     Assessment/Plan    PT Assessment Patient needs continued PT services  PT Problem List Decreased strength;Decreased range of motion;Decreased activity tolerance;Decreased mobility;Pain          PT Treatment Interventions DME instruction;Gait training;Stair training;Functional mobility training;Therapeutic activities;Patient/family education    PT Goals (Current goals can be found in the Care Plan section)  Acute Rehab PT Goals Patient  Stated Goal: back doing all that I used to do,. PT Goal Formulation: With patient Time For Goal Achievement: 12/04/15 Potential to Achieve Goals: Good    Frequency 7X/week   Barriers to discharge        Co-evaluation               End of Session   Activity Tolerance: Patient tolerated treatment well Patient left: in bed;with call bell/phone within reach;Other (comment) (Pt transporting to HD imminently)           Time: 0370-9643 PT Time Calculation (min) (ACUTE ONLY): 27 min   Charges:   PT Evaluation $PT Eval Low Complexity: 1 Procedure PT Treatments $Gait Training: 8-22 mins   PT G CodesTessie Fass Katilyn Miltenberger 11/27/2015, 3:41 PM 11/27/2015  Donnella Sham, PT [phone]  (pager)

## 2015-11-27 NOTE — Progress Notes (Signed)
Orthopedic Tech Progress Note Patient Details:  Matthew Kline [birth year] [id number]  Ortho Devices Ortho Device/Splint Location: trapeze bar patient helper Ortho Device/Splint Interventions: Application   Hildred Priest 11/27/2015, 9:24 AM Viewed order from doctor's order list

## 2015-11-27 NOTE — Progress Notes (Signed)
[name], male   DOB: March 26, 1960, 55 y.o.   MRN: [id number] Postoperative day 1 status post revision left total knee arthroplasty. Plan for dialysis today potassium 6.0. Patient's hemoglobin has improved with 4 units of packed red blood cells currently 8.7. Recommended patient use his incentive spirometer. Physical therapy progressive ambulation weightbearing as tolerated on the left. There is no drainage and the wound VAC. Plan for infectious disease consult for possible long-term antibiotics. Deep tissue cultures were obtained interoperatively.

## 2015-11-27 NOTE — Progress Notes (Signed)
   11/27/15 1400  OT Visit Information  Last OT Received On 11/27/15  Reason Eval/Treat Not Completed Patient at procedure or test/ unavailable (dialysis)   Will reattempt OT eval as schedule permits, likely tomorrow.   Tyrone Schimke OTR/L Pager: [phone]

## 2015-11-27 NOTE — Consult Note (Signed)
[facility] for Infectious Disease  Total days of antibiotics 2        Day 1 cefazolin               Reason for Consult: prosthetic joint infection    Referring Physician: duda  Active Problems:   S/P revision of total knee    HPI: Matthew Kline is a 55 y.o. male with CAD, DM c/b diabetic neuropathy, ESRD on HD, and complicated orthopedic surgery history with his left TKA. He originally had left TKA in Mar 2016 but sustained a fall with wound dehiscence. He required I x D on 04/2014 then underwent 2nd I x D with polyexchange on 05/2104. Due to failing management, he underwent a 2 staged revision where he had excision with abtx spacer in July 2016, treated with 6 wk of vancomycin and ceftaz in HD. He underwent new implant/TKA revision in Oct 2016. Most recently, he is brought back in electively for another revision since his left TKA has been unstable concern for indolent infection vs. Failed HW. Femoral and Tibial components were removed without difficulty new implant in place. He was given 2 units RBC to replete blood loss from surgery. Gram stain showing rare GNR, thus ID asked to see patient.  Past Medical History:  Diagnosis Date  . Allergy   . Anemia, unspecified   . Anxiety   . Arthralgia 2010   polyarticular  . Arthritis    "back, knees" (05/09/2014)  . CHF (congestive heart failure) ([place])   . CHF (congestive heart failure) ([place]) 07/25/2009   denies  . Chronic lower back pain   . Coughing    pt. reports that he has drainage from sinus infection  . Diabetic neuropathy ([place])   . ESRD (end stage renal disease) on dialysis [facility])    started 12/2012; "MWF; [place] [facility] "   . GERD (gastroesophageal reflux disease)    hx "before I lost weight", no problem 9 years  . Hemodialysis access site with mature fistula ([place])   . Hemorrhoids, internal 10/2011   small  . History of blood transfusion    "related to the anemia"  . Hypertension   . Insomnia, unspecified   .  Knee pain, left   . 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 Long term (current) use of anticoagulants   . Myocardial infarction 1995  . Orthostatic hypotension   . Osteomyelitis of foot, left, acute ([place])   . Other chronic postoperative pain   . Pneumonia    "probably 4-5 times" (05/09/2014)  . Polymyalgia rheumatica ([place])   . Renal insufficiency   . Sleep apnea    "lost weight; no more problem" (05/09/2014)  . Stroke ([place]) 01/10/06   denies residual on 05/09/2014  . Type II diabetes mellitus ([place]) dx'd 1995  . Ulcer ([place])    diabetic foot   . Unspecified hereditary and idiopathic peripheral neuropathy    feet  . Unspecified osteomyelitis, site unspecified   . Unspecified vitamin D deficiency     Allergies:  Allergies  Allergen Reactions  . Morphine And Related Other (See Comments)    hallucinations  . Tygacil [Tigecycline] Nausea And Vomiting and Other (See Comments)    Makes him feel crazy     MEDICATIONS: . sodium chloride   Intravenous Once  . aspirin EC  325 mg Oral Q breakfast  . [START ON 11/28/2015] calcitRIOL  1 mcg Oral Q M,W,F-HD  . [START ON 11/28/2015]  ceFAZolin (ANCEF) IV  1 g Intravenous Q24H  . Chlorhexidine Gluconate Cloth  6 each Topical Q0600  . docusate sodium  100 mg Oral BID  . gabapentin  300 mg Oral TID  . insulin aspart  0-15 Units Subcutaneous TID WC  . montelukast  10 mg Oral QHS  . multivitamin  1 tablet Oral QHS  . mupirocin ointment  1 application Nasal BID  . sevelamer carbonate  2,400 mg Oral TID WC  . vancomycin  2,000 mg Intravenous Once  . [START ON 11/28/2015] vancomycin  1,000 mg Intravenous Q M,W,F-HD    Social History  Substance Use Topics  . Smoking status: Current Every Day Smoker    Packs/day: 0.12    Years: 32.00    Types: Cigarettes  . Smokeless tobacco: Former Systems developer    Quit date: 10/06/2014     Comment: 3 -4 cig daily   . Alcohol use No    Family History  Problem Relation Age of Onset  . Hypertension  Mother   . Cancer Mother 43    Ovarian  . Heart disease Maternal Aunt   . Stroke Maternal Grandfather      Review of Systems  Constitutional: Negative for fever, chills, diaphoresis, activity change, appetite change, fatigue and unexpected weight change.  HENT: Negative for congestion, sore throat, rhinorrhea, sneezing, trouble swallowing and sinus pressure.  Eyes: Negative for photophobia and visual disturbance.  Respiratory: Negative for cough, chest tightness, shortness of breath, wheezing and stridor.  Cardiovascular: Negative for chest pain, palpitations and leg swelling.  Gastrointestinal: Negative for nausea, vomiting, abdominal pain, diarrhea, constipation, blood in stool, abdominal distention and anal bleeding.  Genitourinary: Negative for dysuria, hematuria, flank pain and difficulty urinating.  Musculoskeletal: left knee pain Skin: Negative for color change, pallor, rash and wound.  Neurological: Negative for dizziness, tremors, weakness and light-headedness.  Hematological: Negative for adenopathy. Does not bruise/bleed easily.  Psychiatric/Behavioral: Negative for behavioral problems, confusion, sleep disturbance, dysphoric mood, decreased concentration and agitation.     OBJECTIVE: Temp:  [98.4 F (36.9 C)-98.8 F (37.1 C)] 98.4 F (36.9 C) (11/16 1315) Pulse Rate:  [81-110] 98 (11/16 1430) Resp:  [9-30] 18 (11/16 1315) BP: (77-143)/(39-90) 79/49 (11/16 1430) SpO2:  [87 %-100 %] 95 % (11/16 1430) Weight:  [210 lb 5.1 oz (95.4 kg)-215 lb 2.7 oz (97.6 kg)] 210 lb 5.1 oz (95.4 kg) (11/16 1315) Physical Exam  Constitutional: He is oriented to person, place, and time. He appears well-developed and well-nourished. No distress.  HENT:  Mouth/Throat: Oropharynx is clear and moist. No oropharyngeal exudate.  Cardiovascular: Normal rate, regular rhythm and normal heart sounds. Exam reveals no gallop and no friction rub.  No murmur heard.  Pulmonary/Chest: Effort normal and  breath sounds normal. No respiratory distress. He has no wheezes.  Abdominal: Soft. Bowel sounds are normal. He exhibits no distension. There is no tenderness.  Lymphadenopathy:  He has no cervical adenopathy.  Neurological: He is alert and oriented to person, place, and time.  Ext: left knee wound vac in place Skin: Skin is warm and dry. No rash noted. No erythema.  Psychiatric: He has a normal mood and affect. His behavior is normal.     LABS: Results for orders placed or performed during the hospital encounter of 11/26/15 (from the past 48 hour(s))  Glucose, capillary     Status: Abnormal   Collection Time: 11/26/15  7:21 AM  Result Value Ref Range   Glucose-Capillary 120 (H) 65 - 99 mg/dL  Comment 1 Notify RN    Comment 2 Document in Chart   I-STAT 4, (NA,K, GLUC, HGB,HCT)     Status: Abnormal   Collection Time: 11/26/15  7:47 AM  Result Value Ref Range   Sodium 138 135 - 145 mmol/L   Potassium 4.9 3.5 - 5.1 mmol/L   Glucose, Bld 119 (H) 65 - 99 mg/dL   HCT 29.0 (L) 39.0 - 52.0 %   Hemoglobin 9.9 (L) 13.0 - 17.0 g/dL  Prepare RBC     Status: None   Collection Time: 11/26/15  9:54 AM  Result Value Ref Range   Order Confirmation ORDER PROCESSED BY BLOOD BANK   I-STAT 4, (NA,K, GLUC, HGB,HCT)     Status: Abnormal   Collection Time: 11/26/15 10:15 AM  Result Value Ref Range   Sodium 137 135 - 145 mmol/L   Potassium 5.2 (H) 3.5 - 5.1 mmol/L   Glucose, Bld 131 (H) 65 - 99 mg/dL   HCT 27.0 (L) 39.0 - 52.0 %   Hemoglobin 9.2 (L) 13.0 - 17.0 g/dL  Aerobic/Anaerobic Culture (surgical/deep wound)     Status: None (Preliminary result)   Collection Time: 11/26/15 10:39 AM  Result Value Ref Range   Specimen Description TISSUE LEFT KNEE    Special Requests DEEP LEFT KNEE SPECIMEN A    Gram Stain      RARE WBC PRESENT, PREDOMINANTLY MONONUCLEAR RARE GRAM NEGATIVE RODS    Culture PENDING    Report Status PENDING   Glucose, capillary     Status: None   Collection Time: 11/26/15  10:58 AM  Result Value Ref Range   Glucose-Capillary 91 65 - 99 mg/dL  Basic metabolic panel     Status: Abnormal   Collection Time: 11/26/15 12:37 PM  Result Value Ref Range   Sodium 138 135 - 145 mmol/L   Potassium 5.2 (H) 3.5 - 5.1 mmol/L   Chloride 100 (L) 101 - 111 mmol/L   CO2 26 22 - 32 mmol/L   Glucose, Bld 165 (H) 65 - 99 mg/dL   BUN 61 (H) 6 - 20 mg/dL    Comment: REPEATED TO VERIFY   Creatinine, Ser 8.53 (H) 0.61 - 1.24 mg/dL    Comment: REPEATED TO VERIFY   Calcium 8.8 (L) 8.9 - 10.3 mg/dL   GFR calc non Af Amer 6 (L) >60 mL/min   GFR calc Af Amer 7 (L) >60 mL/min    Comment: (NOTE) The eGFR has been calculated using the CKD EPI equation. This calculation has not been validated in all clinical situations. eGFR's persistently <60 mL/min signify possible Chronic Kidney Disease.    Anion gap 12 5 - 15  I-STAT 4, (NA,K, GLUC, HGB,HCT)     Status: Abnormal   Collection Time: 11/26/15  2:29 PM  Result Value Ref Range   Sodium 138 135 - 145 mmol/L   Potassium 6.0 (H) 3.5 - 5.1 mmol/L   Glucose, Bld 134 (H) 65 - 99 mg/dL   HCT 21.0 (L) 39.0 - 52.0 %   Hemoglobin 7.1 (L) 13.0 - 17.0 g/dL  CBC     Status: Abnormal   Collection Time: 11/26/15  2:58 PM  Result Value Ref Range   WBC 9.5 4.0 - 10.5 K/uL   RBC 2.48 (L) 4.22 - 5.81 MIL/uL   Hemoglobin 7.4 (L) 13.0 - 17.0 g/dL   HCT 22.2 (L) 39.0 - 52.0 %   MCV 89.5 78.0 - 100.0 fL   MCH 29.8 26.0 - 34.0 pg  MCHC 33.3 30.0 - 36.0 g/dL   RDW 14.5 11.5 - 15.5 %   Platelets 125 (L) 150 - 400 K/uL  Prepare RBC     Status: None   Collection Time: 11/26/15  4:22 PM  Result Value Ref Range   Order Confirmation ORDER PROCESSED BY BLOOD BANK   CBC     Status: Abnormal   Collection Time: 11/26/15  6:54 PM  Result Value Ref Range   WBC 5.5 4.0 - 10.5 K/uL    Comment: QUESTIONABLE RESULTS, RECOMMEND RECOLLECT TO VERIFY CALLED TO T SMITT,RN 2012 11/26/2015 [facility]    RBC 2.89 (L) 4.22 - 5.81 MIL/uL    Comment: QUESTIONABLE  RESULTS, RECOMMEND RECOLLECT TO VERIFY   Hemoglobin 8.5 (L) 13.0 - 17.0 g/dL    Comment: QUESTIONABLE RESULTS, RECOMMEND RECOLLECT TO VERIFY   HCT 25.2 (L) 39.0 - 52.0 %    Comment: QUESTIONABLE RESULTS, RECOMMEND RECOLLECT TO VERIFY   MCV 87.2 78.0 - 100.0 fL    Comment: QUESTIONABLE RESULTS, RECOMMEND RECOLLECT TO VERIFY   MCH 29.4 26.0 - 34.0 pg    Comment: QUESTIONABLE RESULTS, RECOMMEND RECOLLECT TO VERIFY   MCHC 33.7 30.0 - 36.0 g/dL    Comment: QUESTIONABLE RESULTS, RECOMMEND RECOLLECT TO VERIFY   RDW 14.8 11.5 - 15.5 %    Comment: QUESTIONABLE RESULTS, RECOMMEND RECOLLECT TO VERIFY   Platelets  150 - 400 K/uL    QUESTIONABLE RESULTS, RECOMMEND RECOLLECT TO VERIFY    Comment: CALLED TO T SMITT,RN 2012 11/26/2015 [facility]  Renal function panel     Status: Abnormal   Collection Time: 11/26/15  6:54 PM  Result Value Ref Range   Sodium 136 135 - 145 mmol/L   Potassium 4.7 3.5 - 5.1 mmol/L    Comment: DELTA CHECK NOTED   Chloride 99 (L) 101 - 111 mmol/L   CO2 25 22 - 32 mmol/L   Glucose, Bld 117 (H) 65 - 99 mg/dL   BUN 40 (H) 6 - 20 mg/dL   Creatinine, Ser 5.88 (H) 0.61 - 1.24 mg/dL   Calcium 8.7 (L) 8.9 - 10.3 mg/dL   Phosphorus 4.4 2.5 - 4.6 mg/dL   Albumin 3.3 (L) 3.5 - 5.0 g/dL   GFR calc non Af Amer 10 (L) >60 mL/min   GFR calc Af Amer 11 (L) >60 mL/min    Comment: (NOTE) The eGFR has been calculated using the CKD EPI equation. This calculation has not been validated in all clinical situations. eGFR's persistently <60 mL/min signify possible Chronic Kidney Disease.    Anion gap 12 5 - 15  CBC with Differential/Platelet     Status: Abnormal   Collection Time: 11/27/15  6:28 AM  Result Value Ref Range   WBC 8.9 4.0 - 10.5 K/uL   RBC 2.93 (L) 4.22 - 5.81 MIL/uL   Hemoglobin 8.7 (L) 13.0 - 17.0 g/dL   HCT 25.9 (L) 39.0 - 52.0 %   MCV 88.4 78.0 - 100.0 fL   MCH 29.7 26.0 - 34.0 pg   MCHC 33.6 30.0 - 36.0 g/dL   RDW 15.7 (H) 11.5 - 15.5 %   Platelets 119 (L) 150 - 400  K/uL    Comment: PLATELET COUNT CONFIRMED BY SMEAR   Neutrophils Relative % 64 %   Neutro Abs 5.6 1.7 - 7.7 K/uL   Lymphocytes Relative 24 %   Lymphs Abs 2.1 0.7 - 4.0 K/uL   Monocytes Relative 11 %   Monocytes Absolute 1.0 0.1 - 1.0 K/uL  Eosinophils Relative 1 %   Eosinophils Absolute 0.0 0.0 - 0.7 K/uL   Basophils Relative 1 %   Basophils Absolute 0.0 0.0 - 0.1 K/uL  Glucose, capillary     Status: Abnormal   Collection Time: 11/27/15  7:57 AM  Result Value Ref Range   Glucose-Capillary 124 (H) 65 - 99 mg/dL  Glucose, capillary     Status: None   Collection Time: 11/27/15 11:28 AM  Result Value Ref Range   Glucose-Capillary 90 65 - 99 mg/dL    MICRO: 11/15 or cx - gram negative on gram stain IMAGING: No results found.   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

## 2015-11-27 NOTE — Progress Notes (Signed)
Pharmacy Antibiotic Note  Matthew Kline is a 55 y.o. 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 Was started vancomycin and ancef, now to switch ancef to ceftazidime per ID recommendation. Pt is ESRD on HD-MWF. Currently afebrile, WBC 8.9. Getting HD today, next HD Friday or Saturday   Plan: - Ceftazidime 2g IV x 1 then 2g Q HD on MWF - Monitor HD schedule, tolerance, cultures/susceptibilities   Weight: 210 lb 5.1 oz (95.4 kg)  Temp (24hrs), Avg:98.6 F (37 C), Min:98.4 F (36.9 C), Max:98.8 F (37.1 C)   Recent Labs Lab 11/24/15 1534 11/26/15 1237 11/26/15 1458 11/26/15 1854 11/27/15 0628  WBC 7.9  --  9.5 5.5 8.9  CREATININE 4.69* 8.53*  --  5.88*  --     Estimated Creatinine Clearance: 16.5 mL/min (by C-G formula based on SCr of 5.88 mg/dL (H)).    Allergies  Allergen Reactions  . Morphine And Related Other (See Comments)    hallucinations  . Tygacil [Tigecycline] Nausea And Vomiting and Other (See Comments)    Makes him feel crazy    Antimicrobials this admission: 11/16 vancomycin > 11/15 ancef >   Dose adjustments this admission: NA   Microbiology results: 11/15 L knee tissue: rare GNR   Thank you for allowing pharmacy to be a part of this patient's care.   Manley Mason 11/27/2015 4:13 PM

## 2015-11-27 NOTE — Progress Notes (Signed)
[name], male   DOB: 1960-08-01, 55 y.o.   MRN: [id number] Patient's deep tissue cultures are positive for gram-negative rods sensitivities pending. The cement used for the knee replacement does have gentamicin with the [place] cement

## 2015-11-28 DIAGNOSIS — I959 Hypotension, unspecified: Secondary | ICD-10-CM

## 2015-11-28 DIAGNOSIS — D62 Acute posthemorrhagic anemia: Secondary | ICD-10-CM

## 2015-11-28 DIAGNOSIS — Z9889 Other specified postprocedural states: Secondary | ICD-10-CM

## 2015-11-28 LAB — CBC WITH DIFFERENTIAL/PLATELET
Basophils Absolute: 0.1 10*3/uL (ref 0.0–0.1)
Basophils Relative: 1 %
EOS ABS: 0.1 10*3/uL (ref 0.0–0.7)
EOS PCT: 1 %
HCT: 24.2 % — ABNORMAL LOW (ref 39.0–52.0)
Hemoglobin: 8 g/dL — ABNORMAL LOW (ref 13.0–17.0)
LYMPHS ABS: 1.4 10*3/uL (ref 0.7–4.0)
Lymphocytes Relative: 15 %
MCH: 29.7 pg (ref 26.0–34.0)
MCHC: 33.1 g/dL (ref 30.0–36.0)
MCV: 90 fL (ref 78.0–100.0)
MONOS PCT: 12 %
Monocytes Absolute: 1.1 10*3/uL — ABNORMAL HIGH (ref 0.1–1.0)
Neutro Abs: 6.5 10*3/uL (ref 1.7–7.7)
Neutrophils Relative %: 71 %
PLATELETS: 111 10*3/uL — AB (ref 150–400)
RBC: 2.69 MIL/uL — ABNORMAL LOW (ref 4.22–5.81)
RDW: 15.1 % (ref 11.5–15.5)
WBC: 9.1 10*3/uL (ref 4.0–10.5)

## 2015-11-28 LAB — GLUCOSE, CAPILLARY
GLUCOSE-CAPILLARY: 182 mg/dL — AB (ref 65–99)
Glucose-Capillary: 145 mg/dL — ABNORMAL HIGH (ref 65–99)
Glucose-Capillary: 194 mg/dL — ABNORMAL HIGH (ref 65–99)

## 2015-11-28 MED ORDER — VANCOMYCIN HCL IN DEXTROSE 1-5 GM/200ML-% IV SOLN
INTRAVENOUS | Status: AC
  Filled 2015-11-28: qty 200

## 2015-11-28 MED ORDER — OXYCODONE-ACETAMINOPHEN 5-325 MG PO TABS: 1.0000 | ORAL_TABLET | ORAL | 0 refills | Status: DC | PRN

## 2015-11-28 MED ORDER — HYDROMORPHONE HCL 1 MG/ML IJ SOLN
INTRAMUSCULAR | Status: AC
  Filled 2015-11-28: qty 1

## 2015-11-28 MED ORDER — DARBEPOETIN ALFA 100 MCG/0.5ML IJ SOSY
PREFILLED_SYRINGE | INTRAMUSCULAR | Status: AC
  Filled 2015-11-28: qty 0.5

## 2015-11-28 NOTE — Progress Notes (Signed)
[name], male   DOB: 1960-10-18, 55 y.o.   MRN: [id number] Patient is making good progress with physical therapy. 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 Anticipate discharge to home with home health physical therapy and IV antibiotics with dialysis per infectious disease recommendations. Prescription on the chart for Percocet if discharged over the weekend. Patient will remove the portable wound VAC when the pump stops working.

## 2015-11-28 NOTE — Progress Notes (Signed)
Pharmacy Antibiotic Note  Matthew Kline is a 55 y.o. male with prothestic joint infection s/p TKA revision with new hardware.  To be on vancomycin and ceftazidime for 6 weeks. ESRD on HD MWF.  Plan: - Ceftazidime 2g IV qHD on MWF - Vancomycin 1g IV qHD on MWF - Recommend checking a vancomycin level prior to HD session on 11/22   Height: 5\' 11"  (180.3 cm) Weight: 205 lb 0.4 oz (93 kg) IBW/kg (Calculated) : 75.3  Temp (24hrs), Avg:98.6 F (37 C), Min:98 F (36.7 C), Max:99.4 F (37.4 C)   Recent Labs Lab 11/24/15 1534 11/26/15 1237 11/26/15 1458 11/26/15 1854 11/27/15 0628 11/28/15 0429  WBC 7.9  --  9.5 5.5 8.9 9.1  CREATININE 4.69* 8.53*  --  5.88*  --   --     Estimated Creatinine Clearance: 16.3 mL/min (by C-G formula based on SCr of 5.88 mg/dL (H)).    Allergies  Allergen Reactions  . Morphine And Related Other (See Comments)    hallucinations  . Tygacil [Tigecycline] Nausea And Vomiting and Other (See Comments)    Makes him feel crazy    Antimicrobials this admission: vancomycin 11/16>> ancef 11/15>>11/16 Ceftaz 11/16>>  Dose adjustments this admission: NA   Microbiology results: 11/15 L knee tissue: rare GNR   Thank you for allowing pharmacy to be a part of this patient's care.   Matthew Kline 11/28/2015 2:19 PM

## 2015-11-28 NOTE — Care Management Important Message (Signed)
Important Message  Patient Details  Name: Matthew Kline MRN: [id number] Date of Birth: 09/13/1960   Medicare Important Message Given:  Yes    Erenest Rasher, RN 11/28/2015, 4:11 PM

## 2015-11-28 NOTE — Progress Notes (Signed)
OT Cancellation Note  Patient Details Name: Matthew Kline MRN: [id number] DOB: [birth year]   Cancelled Treatment:    Reason Eval/Treat Not Completed: Patient at procedure or test/ unavailable. Pt at HD, will check back later as able/as appropriate  [name] 11/28/2015, 12:22 PM

## 2015-11-28 NOTE — Progress Notes (Signed)
[facility] for Infectious Disease    Date of Admission:  11/26/2015   Total days of antibiotics 3        Day 2 vanco        Day 2 ceftaz          ID: Matthew Kline is a 55 y.o. male with TKA revision since his prior left TKA has been unstable concern for indolent infection vs. Failed HW. Femoral and Tibial components were removed without difficulty new implant in place Active Problems:   S/P revision of total knee    Subjective: Had episode of hypotension with sBP 75 when working with PT today, prior to getting HD. He reports that he occasional gets periodic hypotension, however ,he did sustain blood loss for surgery, required 2 units. Still down from his baseline of 11. Currently at hgb 8  Medications:  . sodium chloride   Intravenous Once  . aspirin EC  325 mg Oral Q breakfast  . calcitRIOL  1 mcg Oral Q M,W,F-HD  . cefTAZidime (FORTAZ)  IV  2 g Intravenous Q M,W,F-HD  . Chlorhexidine Gluconate Cloth  6 each Topical Q0600  . darbepoetin (ARANESP) injection - DIALYSIS  100 mcg Intravenous Q Fri-HD  . docusate sodium  100 mg Oral BID  . ferric gluconate (FERRLECIT/NULECIT) IV  125 mg Intravenous Q M,W,F-HD  . gabapentin  300 mg Oral BID  . HYDROmorphone      . insulin aspart  0-15 Units Subcutaneous TID WC  . montelukast  10 mg Oral QHS  . multivitamin  1 tablet Oral QHS  . mupirocin ointment  1 application Nasal BID  . sevelamer carbonate  2,400 mg Oral TID WC  . vancomycin  1,000 mg Intravenous Q M,W,F-HD    Objective: Vital signs in last 24 hours: Temp:  [98.4 F (36.9 C)-99.7 F (37.6 C)] 98.6 F (37 C) (11/17 1815) Pulse Rate:  [68-112] 99 (11/17 1815) Resp:  [18] 18 (11/17 1815) BP: (78-202)/(48-184) 95/66 (11/17 1815) SpO2:  [92 %-99 %] 94 % (11/17 1815) Weight:  [200 lb 9.9 oz (91 kg)-207 lb 10.8 oz (94.2 kg)] 200 lb 9.9 oz (91 kg) (11/17 1429)  Physical Exam  Constitutional: He is oriented to person, place, and time. He appears well-developed and  well-nourished. No distress.  HENT:  Mouth/Throat: Oropharynx is clear and moist. No oropharyngeal exudate.  Cardiovascular: Normal rate, regular rhythm and normal heart sounds. Exam reveals no gallop and no friction rub.  No murmur heard.  Pulmonary/Chest: Effort normal and breath sounds normal. No respiratory distress. He has no wheezes.  Abdominal: Soft. Bowel sounds are normal. He exhibits no distension. There is no tenderness.  Ext: left knee has wound vac in place    Lab Results  Recent Labs  11/26/15 1237 11/26/15 1429  11/26/15 1854 11/27/15 0628 11/28/15 0429  WBC  --   --   < > 5.5 8.9 9.1  HGB  --  7.1*  < > 8.5* 8.7* 8.0*  HCT  --  21.0*  < > 25.2* 25.9* 24.2*  NA 138 138  --  136  --   --   K 5.2* 6.0*  --  4.7  --   --   CL 100*  --   --  99*  --   --   CO2 26  --   --  25  --   --   BUN 61*  --   --  40*  --   --  CREATININE 8.53*  --   --  5.88*  --   --   < > = values in this interval not displayed. Liver Panel  Recent Labs  11/26/15 1854  ALBUMIN 3.3*    Microbiology:  Studies/Results: No results found.   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 He is currently on vancomycin plus ceftaz. Awaiting culture results to give final recs. Will check sed rate and crp  Hypotension = concern it maybe related to hypovolemia. Continue to monitor his BP through vitals. And defer to renal to decide how much volume taken at HD  Anemia = 2/2 blood loss for surgery, continue to monitor hgb to see if he may need addn blood tsf  ESRD = will continue to renally dose iv abtx  Dr Linus Salmons available this wkd for final recs.  Baxter Flattery [facility] for Infectious Diseases Cell: [phone] Pager: [phone]  11/28/2015, 6:31 PM

## 2015-11-28 NOTE — Progress Notes (Signed)
CSW acknowledges consult for SNF placement, however, physical therapy recommendation is HH.  CSW is signing off.  Deondre Marinaro B. Paulette Blanch [phone]

## 2015-11-28 NOTE — Consult Note (Signed)
   [facility] CM Inpatient Consult   11/28/2015  Matthew Kline Jul 06, 1960 [id number]    Patient screened for [facility] Management program services. However, he is currently off unit. Will attempt to engage patient for potential [facility] Care Management program at later time.  Marthenia Rolling, MSN-Ed, RN,BSN [facility] Liaison [phone]

## 2015-11-28 NOTE — Progress Notes (Signed)
[facility] Progress Note  Dialysis Orders: NW MWF  4h  91.5kg  2/2 bath  Hep 4000  LUA AVF  - Hect 2 ug tiw  - Mircera 50 ug q 2wks (last 10/18)  - Venofer 100/ hd  (last 11/22)  - Hb 10.8  CA 10.3 P 7 pth 154  Assessment/Plan: 1. L TKR revision POD#2/L knee  tissue infection - gs+ GNR on IV Fortaz/Vanc 2. ESRD - HD MWF  3. 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 HTN/volume -  BP low holding BP meds/ Post HD weight 91kg net UF 2L    Lynnda Child PA-C [facility] Pager [phone] 11/28/2015,3:12 PM  LOS: 2 days   Pt seen, examined and agree w A/P as above.  Kelly Splinter MD [facility] pager [phone]   11/28/2015, 3:31 PM    Subjective:  No new c/os Post-op knee pain . HD today without issues. Likely D/C tomorrow   Objective Vitals:   11/28/15 1300 11/28/15 1330 11/28/15 1400 11/28/15 1429  BP: (!) 84/48 (!) 86/48 (!) 95/49 102/61  Pulse: 96 96 97   Resp:      Temp:    98.4 F (36.9 C)  TempSrc:    Oral  SpO2:      Weight:      Height:       Physical Exam General: Alert, NAD  Heart: RRR Lungs: CTAB Abdomen: soft ND NT  Extremities: L knee w wound vac no LE edema Dialysis Access: LUE AVF cannulated on HD  Additional Objective Labs: Basic Metabolic Panel:  Recent Labs Lab 11/24/15 1534  11/26/15 1237 11/26/15 1429 11/26/15 1854  NA 138  < > 138 138 136  K 4.1  < > 5.2* 6.0* 4.7  CL 92*  --  100*  --  99*  CO2 33*  --  26  --  25  GLUCOSE 128*  < > 165* 134* 117*  BUN 27*  --  61*  --  40*  CREATININE 4.69*  --  8.53*  --  5.88*  CALCIUM 8.8*  --  8.8*  --  8.7*  PHOS  --   --   --   --  4.4  < > = values in this interval not displayed. Liver Function Tests:  Recent Labs Lab 11/26/15 1854  ALBUMIN 3.3*   No results for input(s): LIPASE, AMYLASE in the last 168 hours. CBC:  Recent Labs Lab 11/24/15 1534   11/26/15 1458 11/26/15 1854 11/27/15 0628 11/28/15 0429  WBC 7.9  --  9.5 5.5 8.9 9.1  NEUTROABS  --   --   --   --  5.6 6.5  HGB 10.9*  < > 7.4* 8.5* 8.7* 8.0*  HCT 33.5*  < > 22.2* 25.2* 25.9* 24.2*  MCV 91.3  --  89.5 87.2 88.4 90.0  PLT 206  --  125* QUESTIONABLE RESULTS, RECOMMEND RECOLLECT TO VERIFY 119* 111*  < > = values in this interval not displayed. Blood Culture    Component Value Date/Time   SDES TISSUE LEFT KNEE 11/26/2015 1039   SPECREQUEST DEEP LEFT KNEE SPECIMEN A 11/26/2015 1039   CULT  11/26/2015 1039    NO GROWTH 2 DAYS NO ANAEROBES ISOLATED; CULTURE IN PROGRESS FOR 5 DAYS   REPTSTATUS PENDING 11/26/2015 1039    Cardiac Enzymes: No results for input(s): CKTOTAL, CKMB, CKMBINDEX, TROPONINI in the last 168 hours.  CBG:  Recent Labs Lab 11/27/15 0757 11/27/15 1128 11/27/15 1719 11/27/15 2110 11/28/15 0747  GLUCAP 124* 90 115* 133* 182*   Iron Studies: No results for input(s): IRON, TIBC, TRANSFERRIN, FERRITIN in the last 72 hours. Lab Results  Component Value Date   INR 1.15 10/07/2014   INR 1.11 08/07/2014   INR 2.63 (H) 06/08/2014   Medications: . sodium chloride 10 mL/hr at 11/26/15 2215   . sodium chloride   Intravenous Once  . aspirin EC  325 mg Oral Q breakfast  . calcitRIOL  1 mcg Oral Q M,W,F-HD  . cefTAZidime (FORTAZ)  IV  2 g Intravenous Q M,W,F-HD  . Chlorhexidine Gluconate Cloth  6 each Topical Q0600  . darbepoetin (ARANESP) injection - DIALYSIS  100 mcg Intravenous Q Fri-HD  . docusate sodium  100 mg Oral BID  . ferric gluconate (FERRLECIT/NULECIT) IV  125 mg Intravenous Q M,W,F-HD  . gabapentin  300 mg Oral BID  . HYDROmorphone      . insulin aspart  0-15 Units Subcutaneous TID WC  . montelukast  10 mg Oral QHS  . multivitamin  1 tablet Oral QHS  . mupirocin ointment  1 application Nasal BID  . sevelamer carbonate  2,400 mg Oral TID WC  . vancomycin  1,000 mg Intravenous Q M,W,F-HD

## 2015-11-28 NOTE — Discharge Summary (Signed)
Physician Discharge Summary  [name] MRN: [id number] DOB/AGE: [birth year] 55 y.o.  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 Allergy   . Anemia, unspecified   . Anxiety   . Arthralgia 2010   polyarticular  . Arthritis    "back, knees" (05/09/2014)  . CHF (congestive heart failure) ([place])   . CHF (congestive heart failure) ([place]) 07/25/2009   denies  . Chronic lower back pain   . Coughing    pt. reports that he has drainage from sinus infection  . Diabetic neuropathy ([place])   . ESRD (end stage renal disease) on dialysis [facility])    started 12/2012; "MWF; [place] "   . GERD (gastroesophageal reflux disease)    hx "before I lost weight", no problem 9 years  . Hemodialysis access site with mature fistula ([place])   . Hemorrhoids, internal 10/2011   small  . History of blood transfusion    "related to the anemia"  . Hypertension   . Insomnia, unspecified   . Knee pain, left   . Lacunar infarction ([place]) 2006   RUE/RLE, speech  . Long term (current) use of anticoagulants   . Myocardial infarction 1995  . Orthostatic hypotension   . Osteomyelitis of foot, left, acute ([place])   . Other chronic postoperative pain   . Pneumonia    "probably 4-5 times" (05/09/2014)  . Polymyalgia rheumatica ([place])   . Renal insufficiency   . Sleep apnea    "lost weight; no more problem" (05/09/2014)  . Stroke ([place]) 01/10/06   denies residual on 05/09/2014  . Type II diabetes mellitus ([place]) dx'd 1995  . Ulcer ([place])    diabetic foot   . Unspecified hereditary and idiopathic peripheral neuropathy    feet  . Unspecified osteomyelitis, site unspecified   . Unspecified vitamin D deficiency     Surgeries: Procedure(s): Removal Left Total Knee Arthroplasty, Hinged Total Knee  Arthroplasty on 11/26/2015   Consultants: Treatment Team:  Roney Jaffe, MD   Karolee Ohs M.D. infectious disease Discharged Condition: Improved  Hospital Course: Matthew Kline is an 55 y.o. male who was admitted 11/26/2015 with a chief complaint of unstable left total knee arthroplasty after Traumatic injury, and found to have a diagnosis of unstable collateral ligaments left total knee arthroplasty.  They were brought to the operating room on 11/26/2015 and underwent the above named procedures.    They were given perioperative antibiotics:  Anti-infectives    Start     Dose/Rate Route Frequency Ordered Stop   11/28/15 1200  vancomycin (VANCOCIN) IVPB 1000 mg/200 mL premix     1,000 mg 200 mL/hr over 60 Minutes Intravenous Every M-W-F (Hemodialysis) 11/27/15 1128     11/28/15 1200  cefTAZidime (FORTAZ) 2 g in dextrose 5 % 50 mL IVPB     2 g 100 mL/hr over 30 Minutes Intravenous Every M-W-F (Hemodialysis) 11/27/15 1625     11/28/15 0600  ceFAZolin (ANCEF) IVPB 1 g/50 mL premix  Status:  Discontinued     1 g 100 mL/hr over 30 Minutes Intravenous Every 24 hours 11/27/15 1125 11/27/15 1459   11/27/15 1630  cefTAZidime (FORTAZ) 2 g in dextrose 5 % 50 mL IVPB     2 g 100 mL/hr over 30 Minutes Intravenous  Once 11/27/15 1625 11/27/15 1822  11/27/15 1200  vancomycin (VANCOCIN) 2,000 mg in sodium chloride 0.9 % 500 mL IVPB     2,000 mg 250 mL/hr over 120 Minutes Intravenous  Once 11/27/15 1103 11/27/15 1714   11/27/15 1130  ceFAZolin (ANCEF) IVPB 2g/100 mL premix  Status:  Discontinued     2 g 200 mL/hr over 30 Minutes Intravenous Every 12 hours 11/27/15 1054 11/27/15 1125   11/27/15 0600  ceFAZolin (ANCEF) IVPB 2g/100 mL premix     2 g 200 mL/hr over 30 Minutes Intravenous  Once 11/26/15 1102 11/27/15 0715   11/26/15 0600  ceFAZolin (ANCEF) IVPB 2g/100 mL premix     2 g 200 mL/hr over 30 Minutes Intravenous To Short Stay 11/25/15 1107 11/26/15 0913    .  They were given  compression stockings, early ambulation, and chemoprophylaxis for DVT prophylaxis.  They benefited maximally from their hospital stay and there were no complications.    Recent vital signs:  Vitals:   11/27/15 2058 11/28/15 0520  BP: 135/66 136/61  Pulse: (!) 112 (!) 102  Resp: 18 18  Temp: 98.6 F (37 C) 99.4 F (37.4 C)    Recent laboratory studies:  Results for orders placed or performed during the hospital encounter of 11/26/15  Aerobic/Anaerobic Culture (surgical/deep wound)  Result Value Ref Range   Specimen Description TISSUE LEFT KNEE    Special Requests DEEP LEFT KNEE SPECIMEN A    Gram Stain      RARE WBC PRESENT, PREDOMINANTLY MONONUCLEAR RARE GRAM NEGATIVE RODS    Culture NO GROWTH 1 DAY    Report Status PENDING   Glucose, capillary  Result Value Ref Range   Glucose-Capillary 120 (H) 65 - 99 mg/dL   Comment 1 Notify RN    Comment 2 Document in Chart   Glucose, capillary  Result Value Ref Range   Glucose-Capillary 91 65 - 99 mg/dL  Basic metabolic panel  Result Value Ref Range   Sodium 138 135 - 145 mmol/L   Potassium 5.2 (H) 3.5 - 5.1 mmol/L   Chloride 100 (L) 101 - 111 mmol/L   CO2 26 22 - 32 mmol/L   Glucose, Bld 165 (H) 65 - 99 mg/dL   BUN 61 (H) 6 - 20 mg/dL   Creatinine, Ser 8.53 (H) 0.61 - 1.24 mg/dL   Calcium 8.8 (L) 8.9 - 10.3 mg/dL   GFR calc non Af Amer 6 (L) >60 mL/min   GFR calc Af Amer 7 (L) >60 mL/min   Anion gap 12 5 - 15  CBC  Result Value Ref Range   WBC 9.5 4.0 - 10.5 K/uL   RBC 2.48 (L) 4.22 - 5.81 MIL/uL   Hemoglobin 7.4 (L) 13.0 - 17.0 g/dL   HCT 22.2 (L) 39.0 - 52.0 %   MCV 89.5 78.0 - 100.0 fL   MCH 29.8 26.0 - 34.0 pg   MCHC 33.3 30.0 - 36.0 g/dL   RDW 14.5 11.5 - 15.5 %   Platelets 125 (L) 150 - 400 K/uL  CBC  Result Value Ref Range   WBC 5.5 4.0 - 10.5 K/uL   RBC 2.89 (L) 4.22 - 5.81 MIL/uL   Hemoglobin 8.5 (L) 13.0 - 17.0 g/dL   HCT 25.2 (L) 39.0 - 52.0 %   MCV 87.2 78.0 - 100.0 fL   MCH 29.4 26.0 - 34.0 pg    MCHC 33.7 30.0 - 36.0 g/dL   RDW 14.8 11.5 - 15.5 %   Platelets  150 - 400 K/uL  QUESTIONABLE RESULTS, RECOMMEND RECOLLECT TO VERIFY  Renal function panel  Result Value Ref Range   Sodium 136 135 - 145 mmol/L   Potassium 4.7 3.5 - 5.1 mmol/L   Chloride 99 (L) 101 - 111 mmol/L   CO2 25 22 - 32 mmol/L   Glucose, Bld 117 (H) 65 - 99 mg/dL   BUN 40 (H) 6 - 20 mg/dL   Creatinine, Ser 5.88 (H) 0.61 - 1.24 mg/dL   Calcium 8.7 (L) 8.9 - 10.3 mg/dL   Phosphorus 4.4 2.5 - 4.6 mg/dL   Albumin 3.3 (L) 3.5 - 5.0 g/dL   GFR calc non Af Amer 10 (L) >60 mL/min   GFR calc Af Amer 11 (L) >60 mL/min   Anion gap 12 5 - 15  CBC with Differential/Platelet  Result Value Ref Range   WBC 8.9 4.0 - 10.5 K/uL   RBC 2.93 (L) 4.22 - 5.81 MIL/uL   Hemoglobin 8.7 (L) 13.0 - 17.0 g/dL   HCT 25.9 (L) 39.0 - 52.0 %   MCV 88.4 78.0 - 100.0 fL   MCH 29.7 26.0 - 34.0 pg   MCHC 33.6 30.0 - 36.0 g/dL   RDW 15.7 (H) 11.5 - 15.5 %   Platelets 119 (L) 150 - 400 K/uL   Neutrophils Relative % 64 %   Neutro Abs 5.6 1.7 - 7.7 K/uL   Lymphocytes Relative 24 %   Lymphs Abs 2.1 0.7 - 4.0 K/uL   Monocytes Relative 11 %   Monocytes Absolute 1.0 0.1 - 1.0 K/uL   Eosinophils Relative 1 %   Eosinophils Absolute 0.0 0.0 - 0.7 K/uL   Basophils Relative 1 %   Basophils Absolute 0.0 0.0 - 0.1 K/uL  Glucose, capillary  Result Value Ref Range   Glucose-Capillary 124 (H) 65 - 99 mg/dL  Glucose, capillary  Result Value Ref Range   Glucose-Capillary 90 65 - 99 mg/dL  CBC with Differential/Platelet  Result Value Ref Range   WBC 9.1 4.0 - 10.5 K/uL   RBC 2.69 (L) 4.22 - 5.81 MIL/uL   Hemoglobin 8.0 (L) 13.0 - 17.0 g/dL   HCT 24.2 (L) 39.0 - 52.0 %   MCV 90.0 78.0 - 100.0 fL   MCH 29.7 26.0 - 34.0 pg   MCHC 33.1 30.0 - 36.0 g/dL   RDW 15.1 11.5 - 15.5 %   Platelets 111 (L) 150 - 400 K/uL   Neutrophils Relative % 71 %   Neutro Abs 6.5 1.7 - 7.7 K/uL   Lymphocytes Relative 15 %   Lymphs Abs 1.4 0.7 - 4.0 K/uL   Monocytes  Relative 12 %   Monocytes Absolute 1.1 (H) 0.1 - 1.0 K/uL   Eosinophils Relative 1 %   Eosinophils Absolute 0.1 0.0 - 0.7 K/uL   Basophils Relative 1 %   Basophils Absolute 0.1 0.0 - 0.1 K/uL  Glucose, capillary  Result Value Ref Range   Glucose-Capillary 115 (H) 65 - 99 mg/dL  Glucose, capillary  Result Value Ref Range   Glucose-Capillary 133 (H) 65 - 99 mg/dL  I-STAT 4, (NA,K, GLUC, HGB,HCT)  Result Value Ref Range   Sodium 138 135 - 145 mmol/L   Potassium 4.9 3.5 - 5.1 mmol/L   Glucose, Bld 119 (H) 65 - 99 mg/dL   HCT 29.0 (L) 39.0 - 52.0 %   Hemoglobin 9.9 (L) 13.0 - 17.0 g/dL  I-STAT 4, (NA,K, GLUC, HGB,HCT)  Result Value Ref Range   Sodium 138 135 - 145 mmol/L  Potassium 6.0 (H) 3.5 - 5.1 mmol/L   Glucose, Bld 134 (H) 65 - 99 mg/dL   HCT 21.0 (L) 39.0 - 52.0 %   Hemoglobin 7.1 (L) 13.0 - 17.0 g/dL  I-STAT 4, (NA,K, GLUC, HGB,HCT)  Result Value Ref Range   Sodium 137 135 - 145 mmol/L   Potassium 5.2 (H) 3.5 - 5.1 mmol/L   Glucose, Bld 131 (H) 65 - 99 mg/dL   HCT 27.0 (L) 39.0 - 52.0 %   Hemoglobin 9.2 (L) 13.0 - 17.0 g/dL  Prepare RBC  Result Value Ref Range   Order Confirmation ORDER PROCESSED BY BLOOD BANK   Prepare RBC  Result Value Ref Range   Order Confirmation ORDER PROCESSED BY BLOOD BANK     Discharge Medications:     Medication List    STOP taking these medications   Knee Brace Misc     TAKE these medications   albuterol 108 (90 Base) MCG/ACT inhaler Commonly known as:  PROVENTIL HFA;VENTOLIN HFA Inhale 2 puffs into the lungs every 6 (six) hours as needed for wheezing or shortness of breath.   aspirin 81 MG EC tablet TAKE 1 TABLET EVERY DAY   atorvastatin 10 MG tablet Commonly known as:  LIPITOR Take one tablet by mouth once daily at 6pm for cholesterol. **Needs an appointment before anymore refills.   calcitRIOL 1 MCG/ML solution Commonly known as:  ROCALTROL Take 1 mcg by mouth every Monday, Wednesday, and Friday with hemodialysis.  Calcitriol IV Injection active form of vitamin D.   fluticasone 50 MCG/ACT nasal spray Commonly known as:  FLONASE Place 2 sprays into both nostrils daily.   gabapentin 300 MG capsule Commonly known as:  NEURONTIN Take 1 capsule (300 mg total) by mouth 3 (three) times daily.   metoprolol tartrate 25 MG tablet Commonly known as:  LOPRESSOR TAKE 1/2 TABLET TWICE A DAY   montelukast 10 MG tablet Commonly known as:  SINGULAIR TAKE 1 TABLET (10 MG TOTAL) BY MOUTH AT BEDTIME.   multivitamin Tabs tablet Take 1 tablet by mouth daily.   oxyCODONE-acetaminophen 5-325 MG tablet Commonly known as:  PERCOCET/ROXICET Take 1 tablet by mouth every 4 (four) hours as needed for severe pain.   RENVELA 800 MG tablet Generic drug:  sevelamer carbonate Take 800-3,200 mg by mouth 3 (three) times daily with meals. 800 mg-1600 mg with snacks (depends on dietary intake) and 2400 mg-3200 mg with meals   sorbitol 70 % solution Take 30-60 mLs by mouth daily as needed (for constipation).   temazepam 30 MG capsule Commonly known as:  RESTORIL Take 30 mg by mouth at bedtime as needed. for sleep       Diagnostic Studies: No results found.  Disposition: 01-Home or Self Care  Discharge Instructions    Call MD / Call 911    Complete by:  As directed    If you experience chest pain or shortness of breath, CALL 911 and be transported to the hospital emergency room.  If you develope a fever above 101 F, pus (white drainage) or increased drainage or redness at the wound, or calf pain, call your surgeon's office.   Constipation Prevention    Complete by:  As directed    Drink plenty of fluids.  Prune juice may be helpful.  You may use a stool softener, such as Colace (over the counter) 100 mg twice a day.  Use MiraLax (over the counter) for constipation as needed.   Diet - low sodium heart healthy  Complete by:  As directed    Increase activity slowly as tolerated    Complete by:  As directed     Negative Pressure Wound Therapy - Incisional    Complete by:  As directed    Removed adhesive dressing and sponges within the wound VAC alarms and stops working. Start daily washing with soap and water and apply gauze and Ace wrap after the VAC removed.      Follow-up Information    [name], MD Follow up in 2 week(s).   Specialty:  Orthopedic Surgery Contact information: [place] [address] [phone]            Signed: [name] 11/28/2015, 6:40 AM

## 2015-11-28 NOTE — Progress Notes (Signed)
Physical Therapy Treatment Patient Details Name: Matthew Kline MRN: [id number] DOB: Jun 08, 1960 Today's Date: 11/28/2015    History of Present Illness  Patient is a 55 year old gentleman who sustained acute trauma to his revision total knee arthroplasty. Patient had unstable collateral ligaments has failed conservative care and presents at this time for revision of the total knee arthroplasty.  S/P TKA with hinged knee. PMH of ESRD on dialysis, DM, multiple surgeries L knee (TKA March 2016, REvision October 2016)    PT Comments    Pt ambulated 64' with RW, distance limited by dizziness and hypotension (BP 78/48 sitting, 87/55 supine -RN notified). Significant quadriceps weakness LLE, encouraged pt to do quad sets independently. L knee extension 2/5, SLR +2/5.    Follow Up Recommendations  Home health PT     Equipment Recommendations  None recommended by PT    Recommendations for Other Services       Precautions / Restrictions Precautions Precautions: Fall (minimal risk) Precaution Comments: has VAC/dressing Restrictions Weight Bearing Restrictions: Yes LLE Weight Bearing: Weight bearing as tolerated    Mobility  Bed Mobility Overal bed mobility: Needs Assistance Bed Mobility: Sit to Supine       Sit to supine: Min assist   General bed mobility comments: Min A LLE into bed  Transfers Overall transfer level: Needs assistance Equipment used: Rolling walker (2 wheeled) Transfers: Sit to/from Stand Sit to Stand: Min assist         General transfer comment: min A to rise, good hand placement, VC to extend L knee prior to sitting  Ambulation/Gait Ambulation/Gait assistance: Min assist;Min guard Ambulation Distance (Feet): 70 Feet Assistive device: Rolling walker (2 wheeled) Gait Pattern/deviations: Step-to pattern;Decreased step length - right;Decreased step length - left   Gait velocity interpretation: Below normal speed for age/gender General Gait Details:  distance limited by L knee pain and dizziness, upon sitting BP was 78/48, then 87/55 in supine, RN notified of hypotension   Stairs            Wheelchair Mobility    Modified Rankin (Stroke Patients Only)       Balance     Sitting balance-Leahy Scale: Normal       Standing balance-Leahy Scale: Poor                      Cognition Arousal/Alertness: Awake/alert Behavior During Therapy: WFL for tasks assessed/performed Overall Cognitive Status: Within Functional Limits for tasks assessed                      Exercises Total Joint Exercises Quad Sets: AROM;Left;5 reps;Supine Straight Leg Raises: AAROM;Left;5 reps;Supine Long Arc Quad: AAROM;Left;15 reps;Seated Goniometric ROM: 5-80* L knee AAROM    General Comments        Pertinent Vitals/Pain Pain Score: 7  Pain Location: L knee walking Pain Descriptors / Indicators: Sore Pain Intervention(s): Limited activity within patient's tolerance;Monitored during session;Premedicated before session (pt declined ice)    Home Living                      Prior Function            PT Goals (current goals can now be found in the care plan section) Acute Rehab PT Goals Patient Stated Goal: back doing all that I used to do, strengthen LLE PT Goal Formulation: With patient/family Time For Goal Achievement: 12/04/15 Potential to Achieve Goals: Good Progress towards PT goals: Progressing toward  goals    Frequency    7X/week      PT Plan Current plan remains appropriate    Co-evaluation             End of Session Equipment Utilized During Treatment: Gait belt Activity Tolerance: Patient limited by pain;Treatment limited secondary to medical complications (Comment) (hypotension, dizziness) Patient left: in bed;with call bell/phone within reach;with family/visitor present     Time: 2446-2863 PT Time Calculation (min) (ACUTE ONLY): 43 min  Charges:  $Gait Training: 8-22  mins $Therapeutic Exercise: 8-22 mins $Therapeutic Activity: 8-22 mins                    G Codes:      Philomena Doheny 11/28/2015, 9:53 AM [phone]

## 2015-11-29 ENCOUNTER — Encounter (HOSPITAL_COMMUNITY): Payer: Self-pay | Admitting: Orthopedic Surgery

## 2015-11-29 DIAGNOSIS — M00862 Arthritis due to other bacteria, left knee: Secondary | ICD-10-CM

## 2015-11-29 DIAGNOSIS — T8454XA Infection and inflammatory reaction due to internal left knee prosthesis, initial encounter: Secondary | ICD-10-CM

## 2015-11-29 DIAGNOSIS — Z992 Dependence on renal dialysis: Secondary | ICD-10-CM

## 2015-11-29 DIAGNOSIS — Y792 Prosthetic and other implants, materials and accessory orthopedic devices associated with adverse incidents: Secondary | ICD-10-CM

## 2015-11-29 DIAGNOSIS — B9689 Other specified bacterial agents as the cause of diseases classified elsewhere: Secondary | ICD-10-CM

## 2015-11-29 LAB — CBC WITH DIFFERENTIAL/PLATELET
BASOS ABS: 0.1 10*3/uL (ref 0.0–0.1)
Basophils Relative: 1 %
EOS PCT: 2 %
Eosinophils Absolute: 0.2 10*3/uL (ref 0.0–0.7)
HCT: 24 % — ABNORMAL LOW (ref 39.0–52.0)
Hemoglobin: 7.8 g/dL — ABNORMAL LOW (ref 13.0–17.0)
LYMPHS PCT: 17 %
Lymphs Abs: 1.5 10*3/uL (ref 0.7–4.0)
MCH: 29.5 pg (ref 26.0–34.0)
MCHC: 32.5 g/dL (ref 30.0–36.0)
MCV: 90.9 fL (ref 78.0–100.0)
MONO ABS: 1.3 10*3/uL — AB (ref 0.1–1.0)
Monocytes Relative: 15 %
Neutro Abs: 5.8 10*3/uL (ref 1.7–7.7)
Neutrophils Relative %: 65 %
PLATELETS: 127 10*3/uL — AB (ref 150–400)
RBC: 2.64 MIL/uL — ABNORMAL LOW (ref 4.22–5.81)
RDW: 14.5 % (ref 11.5–15.5)
WBC: 8.9 10*3/uL (ref 4.0–10.5)

## 2015-11-29 LAB — GLUCOSE, CAPILLARY
Glucose-Capillary: 119 mg/dL — ABNORMAL HIGH (ref 65–99)
Glucose-Capillary: 151 mg/dL — ABNORMAL HIGH (ref 65–99)
Glucose-Capillary: 141 mg/dL — ABNORMAL HIGH (ref 65–99)
Glucose-Capillary: 165 mg/dL — ABNORMAL HIGH (ref 65–99)

## 2015-11-29 LAB — SEDIMENTATION RATE: Sed Rate: 80 mm/hr — ABNORMAL HIGH (ref 0–16)

## 2015-11-29 LAB — C-REACTIVE PROTEIN: CRP: 20.4 mg/dL — ABNORMAL HIGH (ref <1.0)

## 2015-11-29 MED ORDER — VANCOMYCIN HCL IN DEXTROSE 1-5 GM/200ML-% IV SOLN
1000.0000 mg | Freq: Once | INTRAVENOUS | Status: AC
  Administered 2015-11-30: 1000 mg via INTRAVENOUS
  Filled 2015-11-29: qty 200

## 2015-11-29 MED ORDER — SODIUM CHLORIDE 0.9 % IV SOLN: 100.0000 mL | INTRAVENOUS | Status: DC | PRN

## 2015-11-29 MED ORDER — ALTEPLASE 2 MG IJ SOLR: 2.0000 mg | Freq: Once | INTRAMUSCULAR | Status: DC | PRN

## 2015-11-29 MED ORDER — LIDOCAINE HCL (PF) 1 % IJ SOLN: 5.0000 mL | INTRAMUSCULAR | Status: DC | PRN

## 2015-11-29 MED ORDER — VANCOMYCIN HCL IN DEXTROSE 1-5 GM/200ML-% IV SOLN
1000.0000 mg | INTRAVENOUS | Status: DC
  Filled 2015-11-29: qty 200

## 2015-11-29 MED ORDER — DEXTROSE 5 % IV SOLN
2.0000 g | INTRAVENOUS | Status: DC
  Filled 2015-11-29: qty 2

## 2015-11-29 MED ORDER — HEPARIN SODIUM (PORCINE) 1000 UNIT/ML DIALYSIS: 1000.0000 [IU] | INTRAMUSCULAR | Status: DC | PRN

## 2015-11-29 MED ORDER — LIDOCAINE-PRILOCAINE 2.5-2.5 % EX CREA: 1.0000 "application " | TOPICAL_CREAM | CUTANEOUS | Status: DC | PRN

## 2015-11-29 MED ORDER — PENTAFLUOROPROP-TETRAFLUOROETH EX AERO: 1.0000 "application " | INHALATION_SPRAY | CUTANEOUS | Status: DC | PRN

## 2015-11-29 MED ORDER — CEFTAZIDIME 2 G IJ SOLR
2.0000 g | INTRAMUSCULAR | Status: AC
  Administered 2015-11-30: 2 g via INTRAVENOUS
  Filled 2015-11-29 (×2): qty 2

## 2015-11-29 NOTE — Progress Notes (Signed)
Subjective: Patient stable.  Pain control.  Wound VAC in place.   Objective: Vital signs in last 24 hours: Temp:  [98.4 F (36.9 C)-99.7 F (37.6 C)] 98.6 F (37 C) (11/18 0449) Pulse Rate:  [91-100] 99 (11/18 0857) Resp:  [16-18] 16 (11/18 0449) BP: (76-202)/(47-184) 76/47 (11/18 0857) SpO2:  [92 %-99 %] 93 % (11/18 0449) Weight:  [200 lb 9.9 oz (91 kg)-205 lb 0.4 oz (93 kg)] 200 lb 9.9 oz (91 kg) (11/17 2059)  Intake/Output from previous day: 11/17 0701 - 11/18 0700 In: 487.8 [P.O.:340; I.V.:147.8] Out: 2050 [Drains:50] Intake/Output this shift: No intake/output data recorded.  Exam:  No cellulitis present Compartment soft  Labs:  Recent Labs  11/26/15 1458 11/26/15 1854 11/27/15 0628 11/28/15 0429 11/29/15 0626  HGB 7.4* 8.5* 8.7* 8.0* 7.8*    Recent Labs  11/28/15 0429 11/29/15 0626  WBC 9.1 8.9  RBC 2.69* 2.64*  HCT 24.2* 24.0*  PLT 111* 127*    Recent Labs  11/26/15 1237 11/26/15 1429 11/26/15 1854  NA 138 138 136  K 5.2* 6.0* 4.7  CL 100*  --  99*  CO2 26  --  25  BUN 61*  --  40*  CREATININE 8.53*  --  5.88*  GLUCOSE 165* 134* 117*  CALCIUM 8.8*  --  8.7*   No results for input(s): LABPT, INR in the last 72 hours.  Assessment/Plan: Matthew Kline had revision total knee to a hinged knee.  Gram stain positive for rare gram negative rods.  Cultures negative today.  This is something that I would recommend be treated with 6 weeks of IV antibiotics and 6 weeks of oral antibiotics.  Anticipate discharge tomorrow when l these antibiotic issues are worked out.    Landry Dyke Tyrann Donaho 11/29/2015, 9:26 AM

## 2015-11-29 NOTE — Progress Notes (Addendum)
[facility] Progress Note  Dialysis Orders: [facility] MWF  4h  91.5kg  2/2 bath  Hep 4000  LUA AVF  - Hect 2 ug tiw  - Mircera 50 ug q 2wks (last 10/18)  - Venofer 100/ hd  (last 11/22)  - Hb 10.8  CA 10.3 P 7 pth 154  Assessment/Plan: 1. L TKR revision POD#2/L knee  tissue infection - gs+ GNR on gram stain, culture neg to date, per ortho/ ID 2. ESRD - HD MWF. HD Sunday am on holiday sched 3. 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 HTN/volume -  At dry wt, BP's a little better , home MTP on hold    Kelly Splinter MD [place] pager [phone]   11/29/2015, 9:45 AM    Subjective:  No new c/o's.   Objective Vitals:   11/28/15 1815 11/28/15 2059 11/29/15 0449 11/29/15 0857  BP: 95/66 (!) 101/56 121/60 (!) 76/47  Pulse: 99 100 92 99  Resp: 18 17 16    Temp: 98.6 F (37 C) 99.3 F (37.4 C) 98.6 F (37 C)   TempSrc: Oral  Oral   SpO2: 94% 92% 93%   Weight:  91 kg (200 lb 9.9 oz)    Height:       Physical Exam General: Alert, NAD  Heart: RRR Lungs: CTAB Abdomen: soft ND NT  Extremities: L knee w wound vac no LE edema Dialysis Access: LUE AVF cannulated on HD  Additional Objective Labs: Basic Metabolic Panel:  Recent Labs Lab 11/24/15 1534  11/26/15 1237 11/26/15 1429 11/26/15 1854  NA 138  < > 138 138 136  K 4.1  < > 5.2* 6.0* 4.7  CL 92*  --  100*  --  99*  CO2 33*  --  26  --  25  GLUCOSE 128*  < > 165* 134* 117*  BUN 27*  --  61*  --  40*  CREATININE 4.69*  --  8.53*  --  5.88*  CALCIUM 8.8*  --  8.8*  --  8.7*  PHOS  --   --   --   --  4.4  < > = values in this interval not displayed. Liver Function Tests:  Recent Labs Lab 11/26/15 1854  ALBUMIN 3.3*   No results for input(s): LIPASE, AMYLASE in the last 168 hours. CBC:  Recent Labs Lab 11/26/15 1458 11/26/15 1854 11/27/15 0628 11/28/15 0429 11/29/15 0626  WBC 9.5 5.5 8.9 9.1 8.9  NEUTROABS  --    --  5.6 6.5 5.8  HGB 7.4* 8.5* 8.7* 8.0* 7.8*  HCT 22.2* 25.2* 25.9* 24.2* 24.0*  MCV 89.5 87.2 88.4 90.0 90.9  PLT 125* QUESTIONABLE RESULTS, RECOMMEND RECOLLECT TO VERIFY 119* 111* 127*   Blood Culture    Component Value Date/Time   SDES TISSUE LEFT KNEE 11/26/2015 1039   SPECREQUEST DEEP LEFT KNEE SPECIMEN A 11/26/2015 1039   CULT  11/26/2015 1039    NO GROWTH 2 DAYS NO ANAEROBES ISOLATED; CULTURE IN PROGRESS FOR 5 DAYS   REPTSTATUS PENDING 11/26/2015 1039    Cardiac Enzymes: No results for input(s): CKTOTAL, CKMB, CKMBINDEX, TROPONINI in the last 168 hours. CBG:  Recent Labs Lab 11/27/15 2110 11/28/15 0747 11/28/15 1659 11/28/15 2040 11/29/15 0754  GLUCAP 133* 182* 145* 194* 119*   Iron Studies: No results for input(s): IRON, TIBC, TRANSFERRIN, FERRITIN in the last 72 hours. Lab Results  Component Value Date  INR 1.15 10/07/2014   INR 1.11 08/07/2014   INR 2.63 (H) 06/08/2014   Medications: . sodium chloride Stopped (11/28/15 2047)   . sodium chloride   Intravenous Once  . aspirin EC  325 mg Oral Q breakfast  . calcitRIOL  1 mcg Oral Q M,W,F-HD  . cefTAZidime (FORTAZ)  IV  2 g Intravenous Q M,W,F-HD  . Chlorhexidine Gluconate Cloth  6 each Topical Q0600  . darbepoetin (ARANESP) injection - DIALYSIS  100 mcg Intravenous Q Fri-HD  . docusate sodium  100 mg Oral BID  . ferric gluconate (FERRLECIT/NULECIT) IV  125 mg Intravenous Q M,W,F-HD  . gabapentin  300 mg Oral BID  . insulin aspart  0-15 Units Subcutaneous TID WC  . montelukast  10 mg Oral QHS  . multivitamin  1 tablet Oral QHS  . mupirocin ointment  1 application Nasal BID  . sevelamer carbonate  2,400 mg Oral TID WC  . vancomycin  1,000 mg Intravenous Q M,W,F-HD

## 2015-11-29 NOTE — Progress Notes (Signed)
[facility] for Infectious Disease   Reason for visit: Follow up on TKA revision with infection  Interval History: no growth on cultures, afebrile, rare gram negative rods on gram stain. ESR, CRP elevated.   Physical Exam: Constitutional:  Vitals:   11/29/15 0857 11/29/15 1024  BP: (!) 76/47 (!) 114/58  Pulse: 99 96  Resp:  14  Temp:  98.2 F (36.8 C)   patient appears in NAD Respiratory: Normal respiratory effort; CTA B Cardiovascular: RRR MS: left knee with VAC in place  Review of Systems: Constitutional: negative for fevers and chills Gastrointestinal: negative for diarrhea  Lab Results  Component Value Date   WBC 8.9 11/29/2015   HGB 7.8 (L) 11/29/2015   HCT 24.0 (L) 11/29/2015   MCV 90.9 11/29/2015   PLT 127 (L) 11/29/2015    Lab Results  Component Value Date   CREATININE 5.88 (H) 11/26/2015   BUN 40 (H) 11/26/2015   NA 136 11/26/2015   K 4.7 11/26/2015   CL 99 (L) 11/26/2015   CO2 25 11/26/2015    Lab Results  Component Value Date   ALT 9 (L) 10/07/2014   AST 16 10/07/2014   ALKPHOS 89 10/07/2014     Microbiology: Recent Results (from the past 240 hour(s))  Surgical pcr screen     Status: Abnormal   Collection Time: 11/24/15  3:33 PM  Result Value Ref Range Status   MRSA, PCR POSITIVE (A) NEGATIVE Final    Comment: RESULT CALLED TO, READ BACK BY AND VERIFIED WITH: York Spaniel RN 17:45 11/24/15 ([email or web address])    Staphylococcus aureus POSITIVE (A) NEGATIVE Final    Comment:        The Xpert SA Assay (FDA approved for NASAL specimens in patients over 83 years of age), is one component of a comprehensive surveillance program.  Test performance has been validated by [facility] for patients greater than or equal to 23 year old. It is not intended to diagnose infection nor to guide or monitor treatment.   Aerobic/Anaerobic Culture (surgical/deep wound)     Status: None (Preliminary result)   Collection Time: 11/26/15 10:39 AM  Result Value  Ref Range Status   Specimen Description TISSUE LEFT KNEE  Final   Special Requests DEEP LEFT KNEE SPECIMEN A  Final   Gram Stain   Final    RARE WBC PRESENT, PREDOMINANTLY MONONUCLEAR RARE GRAM NEGATIVE RODS    Culture NO GROWTH 3 DAYS  Final   Report Status PENDING  Incomplete    Impression/Plan:  1. TKA revision with infection - no growth now 3 days; day 3 vanocmycin ceftaz.  If it continues to be negative growth I recommend continuing the vanco/ceftaz with dialysis for 6 weeks through December 26th Can then continue with doxyxycline and keflex for 3-6 months. I will check culture again tomorrow, otherwise plan as above  2. Unstable LTKA - likely with indolent infection with GNR, WBCs.  Will treat as above.

## 2015-11-30 ENCOUNTER — Encounter (HOSPITAL_COMMUNITY): Payer: Self-pay | Admitting: Orthopedic Surgery

## 2015-11-30 LAB — RENAL FUNCTION PANEL
ANION GAP: 14 (ref 5–15)
Albumin: 2.8 g/dL — ABNORMAL LOW (ref 3.5–5.0)
BUN: 51 mg/dL — ABNORMAL HIGH (ref 6–20)
CO2: 25 mmol/L (ref 22–32)
Calcium: 9.3 mg/dL (ref 8.9–10.3)
Chloride: 96 mmol/L — ABNORMAL LOW (ref 101–111)
Creatinine, Ser: 7.64 mg/dL — ABNORMAL HIGH (ref 0.61–1.24)
GFR calc Af Amer: 8 mL/min — ABNORMAL LOW (ref >60)
GFR calc non Af Amer: 7 mL/min — ABNORMAL LOW (ref >60)
GLUCOSE: 110 mg/dL — AB (ref 65–99)
POTASSIUM: 4.2 mmol/L (ref 3.5–5.1)
Phosphorus: 6 mg/dL — ABNORMAL HIGH (ref 2.5–4.6)
Sodium: 135 mmol/L (ref 135–145)

## 2015-11-30 LAB — CBC
HEMATOCRIT: 23.7 % — AB (ref 39.0–52.0)
HEMOGLOBIN: 7.6 g/dL — AB (ref 13.0–17.0)
MCH: 29 pg (ref 26.0–34.0)
MCHC: 32.1 g/dL (ref 30.0–36.0)
MCV: 90.5 fL (ref 78.0–100.0)
Platelets: 160 10*3/uL (ref 150–400)
RBC: 2.62 MIL/uL — ABNORMAL LOW (ref 4.22–5.81)
RDW: 14.2 % (ref 11.5–15.5)
WBC: 7.5 10*3/uL (ref 4.0–10.5)

## 2015-11-30 LAB — GLUCOSE, CAPILLARY
Glucose-Capillary: 117 mg/dL — ABNORMAL HIGH (ref 65–99)
Glucose-Capillary: 134 mg/dL — ABNORMAL HIGH (ref 65–99)
Glucose-Capillary: 132 mg/dL — ABNORMAL HIGH (ref 65–99)

## 2015-11-30 MED ORDER — VANCOMYCIN HCL IN DEXTROSE 1-5 GM/200ML-% IV SOLN
INTRAVENOUS | Status: AC
  Filled 2015-11-30: qty 200

## 2015-11-30 NOTE — Progress Notes (Signed)
Subjective: Pt stable - wound vac now working - portable unit not functional   Objective: Vital signs in last 24 hours: Temp:  [97.5 F (36.4 C)-98.7 F (37.1 C)] 97.9 F (36.6 C) (11/19 0700) Pulse Rate:  [78-99] 86 (11/19 1045) Resp:  [16-18] 16 (11/19 0700) BP: (107-149)/(55-78) 130/68 (11/19 1045) SpO2:  [90 %-99 %] 97 % (11/19 0700) Weight:  [200 lb 13.4 oz (91.1 kg)] 200 lb 13.4 oz (91.1 kg) (11/19 0700)  Intake/Output from previous day: 11/18 0701 - 11/19 0700 In: 720 [P.O.:720] Out: 0  Intake/Output this shift: No intake/output data recorded.  Exam:  Sensation intact distally Intact pulses distally  Labs:  Recent Labs  11/28/15 0429 11/29/15 0626 11/30/15 0723  HGB 8.0* 7.8* 7.6*    Recent Labs  11/29/15 0626 11/30/15 0723  WBC 8.9 7.5  RBC 2.64* 2.62*  HCT 24.0* 23.7*  PLT 127* 160    Recent Labs  11/30/15 0724  NA 135  K 4.2  CL 96*  CO2 25  BUN 51*  CREATININE 7.64*  GLUCOSE 110*  CALCIUM 9.3   No results for input(s): LABPT, INR in the last 72 hours.  Assessment/Plan: Plan dc am - will need wound vac - 6 weeks of iv and 3 mos po abx   Matthew Kline 11/30/2015, 10:53 AM

## 2015-11-30 NOTE — Progress Notes (Signed)
[facility] Progress Note  Dialysis Orders: [facility] MWF  4h  91.5kg  2/2 bath  Hep 4000  LUA AVF  - Hect 2 ug tiw  - Mircera 50 ug q 2wks (last 10/18)  - Venofer 100/ hd  (last 11/22)  - Hb 10.8  CA 10.3 P 7 pth 154  Assessment: 1. L TKR revision POD#2/L knee infection - gs+ GNR on gram stain, culture neg to date, per ID "If it continues to be negative growth... recommend continuing vanco/ceftaz with dialysis for 6 weeks through December 26th...can then continue with doxyxycline and keflex for 3-6 months" 2. ESRD - HD MWF. HD Sunday am on holiday sched 3. 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 HTN/volume -  At dry wt, BP's a little better , home MTP on hold 6. Dispo - stable from renal standpoint   Plan - cont vanc/ fortaz for 6 wks IV w HD as above per ID. HD today, no UF.    Kelly Splinter MD [facility] pager [phone]   11/30/2015, 9:58 AM    Subjective:  No new c/o's.   Objective Vitals:   11/30/15 0830 11/30/15 0900 11/30/15 0915 11/30/15 0925  BP: 131/64 118/67 133/68 134/69  Pulse: 84 84 85 85  Resp:      Temp:      TempSrc:      SpO2:      Weight:      Height:       Physical Exam General: Alert, NAD  Heart: RRR Lungs: CTAB Abdomen: soft ND NT  Extremities: L knee w wound vac no LE edema Dialysis Access: LUE AVF cannulated on HD  Additional Objective Labs: Basic Metabolic Panel:  Recent Labs Lab 11/26/15 1237 11/26/15 1429 11/26/15 1854 11/30/15 0724  NA 138 138 136 135  K 5.2* 6.0* 4.7 4.2  CL 100*  --  99* 96*  CO2 26  --  25 25  GLUCOSE 165* 134* 117* 110*  BUN 61*  --  40* 51*  CREATININE 8.53*  --  5.88* 7.64*  CALCIUM 8.8*  --  8.7* 9.3  PHOS  --   --  4.4 6.0*   Liver Function Tests:  Recent Labs Lab 11/26/15 1854 11/30/15 0724  ALBUMIN 3.3* 2.8*   No results for input(s): LIPASE, AMYLASE in the last 168 hours. CBC:  Recent  Labs Lab 11/26/15 1854 11/27/15 0628 11/28/15 0429 11/29/15 0626 11/30/15 0723  WBC 5.5 8.9 9.1 8.9 7.5  NEUTROABS  --  5.6 6.5 5.8  --   HGB 8.5* 8.7* 8.0* 7.8* 7.6*  HCT 25.2* 25.9* 24.2* 24.0* 23.7*  MCV 87.2 88.4 90.0 90.9 90.5  PLT QUESTIONABLE RESULTS, RECOMMEND RECOLLECT TO VERIFY 119* 111* 127* 160   Blood Culture    Component Value Date/Time   SDES TISSUE LEFT KNEE 11/26/2015 1039   SPECREQUEST DEEP LEFT KNEE SPECIMEN A 11/26/2015 1039   CULT  11/26/2015 1039    NO GROWTH 4 DAYS NO ANAEROBES ISOLATED; CULTURE IN PROGRESS FOR 5 DAYS   REPTSTATUS PENDING 11/26/2015 1039    Cardiac Enzymes: No results for input(s): CKTOTAL, CKMB, CKMBINDEX, TROPONINI in the last 168 hours. CBG:  Recent Labs Lab 11/28/15 2040 11/29/15 0754 11/29/15 1124 11/29/15 1700 11/29/15 2120  GLUCAP 194* 119* 151* 141* 165*   Iron Studies: No results for input(s): IRON, TIBC, TRANSFERRIN, FERRITIN in the last 72 hours. Lab Results  Component Value  Date   INR 1.15 10/07/2014   INR 1.11 08/07/2014   INR 2.63 (H) 06/08/2014   Medications: . sodium chloride Stopped (11/28/15 2047)   . sodium chloride   Intravenous Once  . aspirin EC  325 mg Oral Q breakfast  . calcitRIOL  1 mcg Oral Q M,W,F-HD  . cefTAZidime (FORTAZ)  IV  2 g Intravenous Q M,W,F-HD  . cefTAZidime (FORTAZ)  IV  2 g Intravenous Q T,Th,S,Su-1800  . cefTAZidime (FORTAZ)  IV  2 g Intravenous Q T,Th,S,Su-1800  . Chlorhexidine Gluconate Cloth  6 each Topical Q0600  . darbepoetin (ARANESP) injection - DIALYSIS  100 mcg Intravenous Q Fri-HD  . docusate sodium  100 mg Oral BID  . ferric gluconate (FERRLECIT/NULECIT) IV  125 mg Intravenous Q M,W,F-HD  . gabapentin  300 mg Oral BID  . insulin aspart  0-15 Units Subcutaneous TID WC  . montelukast  10 mg Oral QHS  . multivitamin  1 tablet Oral QHS  . mupirocin ointment  1 application Nasal BID  . sevelamer carbonate  2,400 mg Oral TID WC  . vancomycin  1,000 mg Intravenous Q  M,W,F-HD  . vancomycin  1,000 mg Intravenous Once

## 2015-11-30 NOTE — Progress Notes (Signed)
[place] for Infectious Disease   Reason for visit: Follow up on TKA revision with infection  Interval History: no growth on cultures, afebrile, rare gram negative rods on gram stain. ESR, CRP elevated.   Physical Exam: Constitutional:  Vitals:   11/30/15 1109 11/30/15 1149  BP: (!) 142/70 (!) 130/54  Pulse: 86 88  Resp: 17 18  Temp: 98 F (36.7 C) 98.2 F (36.8 C)   patient appears in NAD   Review of Systems: Constitutional: negative for fevers and chills Gastrointestinal: negative for diarrhea  Lab Results  Component Value Date   WBC 7.5 11/30/2015   HGB 7.6 (L) 11/30/2015   HCT 23.7 (L) 11/30/2015   MCV 90.5 11/30/2015   PLT 160 11/30/2015    Lab Results  Component Value Date   CREATININE 7.64 (H) 11/30/2015   BUN 51 (H) 11/30/2015   NA 135 11/30/2015   K 4.2 11/30/2015   CL 96 (L) 11/30/2015   CO2 25 11/30/2015    Lab Results  Component Value Date   ALT 9 (L) 10/07/2014   AST 16 10/07/2014   ALKPHOS 89 10/07/2014     Microbiology: Recent Results (from the past 240 hour(s))  Surgical pcr screen     Status: Abnormal   Collection Time: 11/24/15  3:33 PM  Result Value Ref Range Status   MRSA, PCR POSITIVE (A) NEGATIVE Final    Comment: RESULT CALLED TO, READ BACK BY AND VERIFIED WITH: York Spaniel RN 17:45 11/24/15 ([email or web address])    Staphylococcus aureus POSITIVE (A) NEGATIVE Final    Comment:        The Xpert SA Assay (FDA approved for NASAL specimens in patients over 32 years of age), is one component of a comprehensive surveillance program.  Test performance has been validated by [facility] for patients greater than or equal to 60 year old. It is not intended to diagnose infection nor to guide or monitor treatment.   Aerobic/Anaerobic Culture (surgical/deep wound)     Status: None (Preliminary result)   Collection Time: 11/26/15 10:39 AM  Result Value Ref Range Status   Specimen Description TISSUE LEFT KNEE  Final   Special Requests  DEEP LEFT KNEE SPECIMEN A  Final   Gram Stain   Final    RARE WBC PRESENT, PREDOMINANTLY MONONUCLEAR RARE GRAM NEGATIVE RODS    Culture   Final    NO GROWTH 4 DAYS NO ANAEROBES ISOLATED; CULTURE IN PROGRESS FOR 5 DAYS   Report Status PENDING  Incomplete    Impression/Plan:  1. TKA revision with infection - no growth now 4 days; day 4 vanocmycin ceftaz.  Continue with vanco/ceftaz with dialysis for 6 weeks through December 26th Can then continue with doxyxycline and keflex for 3-6 months.  I will sign off, please call with any questions.

## 2015-11-30 NOTE — Progress Notes (Signed)
PT Cancellation Note  Patient Details Name: Matthew Kline MRN: [id number] DOB: Oct 21, 1960   Cancelled Treatment:    Reason Eval/Treat Not Completed: Patient at procedure or test/unavailable Pt off floor at HD. Will follow up as time allows.   Marguarite Arbour A Jaliana Medellin 11/30/2015, 8:08 AM Wray Kearns, PT, DPT [phone]

## 2015-12-01 ENCOUNTER — Telehealth (INDEPENDENT_AMBULATORY_CARE_PROVIDER_SITE_OTHER): Payer: Self-pay | Admitting: Orthopedic Surgery

## 2015-12-01 LAB — AEROBIC/ANAEROBIC CULTURE W GRAM STAIN (SURGICAL/DEEP WOUND): Culture: NO GROWTH

## 2015-12-01 LAB — GLUCOSE, CAPILLARY: GLUCOSE-CAPILLARY: 118 mg/dL — AB (ref 65–99)

## 2015-12-01 MED ORDER — DEXTROSE 5 % IV SOLN: 2.0000 g | INTRAVENOUS | 0 refills | Status: DC

## 2015-12-01 MED ORDER — OXYCODONE-ACETAMINOPHEN 5-325 MG PO TABS
1.0000 | ORAL_TABLET | ORAL | 0 refills | Status: DC | PRN
Start: 2015-12-01 — End: 2016-08-20

## 2015-12-01 MED ORDER — VANCOMYCIN HCL IN DEXTROSE 1-5 GM/200ML-% IV SOLN: 1000.0000 mg | INTRAVENOUS | 0 refills | Status: DC

## 2015-12-01 NOTE — Care Management Note (Signed)
Case Management Note  Patient Details  Name: Matthew Kline MRN: [id number] Date of Birth: [birth year]  Subjective/Objective:      CM following for progression and d/c planning.               Action/Plan: 12/01/2015 Pt for d/c to home today, pt was previously active with [facility] and will continue these services.  VAC d/c this am, [facility] notified.  Met with pt who is anxious to d/c and no further needs identified.   Expected Discharge Date:                  Expected Discharge Plan:  [place]  In-House Referral:  NA  Discharge planning Services  CM Consult  Post Acute Care Choice:    Choice offered to:  Patient  DME Arranged:  N/A DME Agency:  [facility] (now [facility])  [place] Arranged:  RN, PT, OT [place] Agency:  [place]  Status of Service:  Completed, signed off  If discussed at [place] of Stay Meetings, dates discussed:    Additional Comments:  Adron Bene, RN 12/01/2015, 12:46 PM

## 2015-12-01 NOTE — Progress Notes (Signed)
Matthew Kline to be D/C'd Home per MD order.  Discussed prescriptions and follow up appointments with the patient. Prescriptions given to patient, medication list explained in detail. Pt verbalized understanding.    Medication List    STOP taking these medications   Knee Brace Misc     TAKE these medications   albuterol 108 (90 Base) MCG/ACT inhaler Commonly known as:  PROVENTIL HFA;VENTOLIN HFA Inhale 2 puffs into the lungs every 6 (six) hours as needed for wheezing or shortness of breath.   aspirin 81 MG EC tablet TAKE 1 TABLET EVERY DAY   atorvastatin 10 MG tablet Commonly known as:  LIPITOR Take one tablet by mouth once daily at 6pm for cholesterol. **Needs an appointment before anymore refills.   calcitRIOL 1 MCG/ML solution Commonly known as:  ROCALTROL Take 1 mcg by mouth every Monday, Wednesday, and Friday with hemodialysis. Calcitriol IV Injection active form of vitamin D.   cefTAZidime 2 g in dextrose 5 % 50 mL Inject 2 g into the vein every Monday, Wednesday, and Friday with hemodialysis.   fluticasone 50 MCG/ACT nasal spray Commonly known as:  FLONASE Place 2 sprays into both nostrils daily.   gabapentin 300 MG capsule Commonly known as:  NEURONTIN Take 1 capsule (300 mg total) by mouth 3 (three) times daily.   metoprolol tartrate 25 MG tablet Commonly known as:  LOPRESSOR TAKE 1/2 TABLET TWICE A DAY   montelukast 10 MG tablet Commonly known as:  SINGULAIR TAKE 1 TABLET (10 MG TOTAL) BY MOUTH AT BEDTIME.   multivitamin Tabs tablet Take 1 tablet by mouth daily.   oxyCODONE-acetaminophen 5-325 MG tablet Commonly known as:  PERCOCET/ROXICET Take 1 tablet by mouth every 4 (four) hours as needed for severe pain.   RENVELA 800 MG tablet Generic drug:  sevelamer carbonate Take 800-3,200 mg by mouth 3 (three) times daily with meals. 800 mg-1600 mg with snacks (depends on dietary intake) and 2400 mg-3200 mg with meals   sorbitol 70 % solution Take 30-60  mLs by mouth daily as needed (for constipation).   temazepam 30 MG capsule Commonly known as:  RESTORIL Take 30 mg by mouth at bedtime as needed. for sleep   vancomycin 1-5 GM/200ML-% Soln Commonly known as:  VANCOCIN Inject 200 mLs (1,000 mg total) into the vein every Monday, Wednesday, and Friday with hemodialysis.       Vitals:   12/01/15 0557 12/01/15 0945  BP: 122/63 140/72  Pulse: 80 85  Resp: 16 16  Temp: 98 F (36.7 C) 97.7 F (36.5 C)    Skin clean, dry and intact without evidence of skin break down, no evidence of skin tears noted. IV catheter discontinued intact. Site without signs and symptoms of complications. Dressing and pressure applied. Pt denies pain at this time. No complaints noted.  An After Visit Summary was printed and given to the patient. Patient escorted via [place], and D/C home via private auto.  Dixie Dials RN, BSN

## 2015-12-01 NOTE — Telephone Encounter (Signed)
U call patient may remove dry dressing to shower and then apply a new dry dressing. If he does not shower he may change the dressing as needed.

## 2015-12-01 NOTE — Telephone Encounter (Signed)
I called and spoke with patient to advise of message below. And made follow up appt for next week.

## 2015-12-01 NOTE — Discharge Summary (Signed)
Discharge Diagnoses:  Active Problems:   S/P revision of total knee   Surgeries: Procedure(s): Removal Left Total Knee Arthroplasty, Hinged Total Knee Arthroplasty on 11/26/2015    Consultants: Treatment Team:  Roney Jaffe, MD  Infectious disease Discharged Condition: Improved  Hospital Course: Matthew Kline is an 54 y.o. male who was admitted 11/26/2015 with a chief complaint of unstable left total knee arthroplasty., with a final diagnosis of Unstable Left Total Knee Arthroplasty.  Patient was brought to the operating room on 11/26/2015 and underwent Procedure(s): Removal Left Total Knee Arthroplasty, Hinged Total Knee Arthroplasty.  Postoperatively patient's deep tissue cultures Gram stain was positive for gram-negative rods. After 4 days cultures remain negative. Infectious disease recommended vancomycin and Fortaz for 6 weeks after dialysis followed by doxycycline and Keflex for 3-6 months. Patient incision is clean and dry the wound VAC is removed. Patient is pleased with his progress follow-up in the office in 1 week.  Patient was given perioperative antibiotics: Anti-infectives    Start     Dose/Rate Route Frequency Ordered Stop   12/01/15 0000  vancomycin (VANCOCIN) 1-5 GM/200ML-% SOLN     1,000 mg 200 mL/hr over 60 Minutes Intravenous Every M-W-F (Hemodialysis) 12/01/15 0656     12/01/15 0000  cefTAZidime 2 g in dextrose 5 % 50 mL     2 g 100 mL/hr over 30 Minutes Intravenous Every M-W-F (Hemodialysis) 12/01/15 0656     11/30/15 1800  cefTAZidime (FORTAZ) 2 g in dextrose 5 % 50 mL IVPB  Status:  Discontinued     2 g 100 mL/hr over 30 Minutes Intravenous Every T-Th-S-Su (1800) 11/29/15 1651 11/30/15 1302   11/30/15 1200  vancomycin (VANCOCIN) IVPB 1000 mg/200 mL premix     1,000 mg 200 mL/hr over 60 Minutes Intravenous  Once 11/29/15 1651 11/30/15 1300   11/29/15 1800  cefTAZidime (FORTAZ) 2 g in dextrose 5 % 50 mL IVPB     2 g 100 mL/hr over 30 Minutes Intravenous  Every T-Th-S-Su (1800) 11/29/15 1502 11/30/15 1018   11/29/15 1515  vancomycin (VANCOCIN) IVPB 1000 mg/200 mL premix  Status:  Discontinued     1,000 mg 200 mL/hr over 60 Minutes Intravenous Every T-Th-S-Su 11/29/15 1502 11/29/15 1651   11/28/15 1340  vancomycin (VANCOCIN) 1-5 GM/200ML-% IVPB    Comments:  Ronny Bacon  : cabinet override      11/28/15 1340 11/28/15 1402   11/28/15 1200  vancomycin (VANCOCIN) IVPB 1000 mg/200 mL premix     1,000 mg 200 mL/hr over 60 Minutes Intravenous Every M-W-F (Hemodialysis) 11/27/15 1128     11/28/15 1200  cefTAZidime (FORTAZ) 2 g in dextrose 5 % 50 mL IVPB     2 g 100 mL/hr over 30 Minutes Intravenous Every M-W-F (Hemodialysis) 11/27/15 1625     11/28/15 0600  ceFAZolin (ANCEF) IVPB 1 g/50 mL premix  Status:  Discontinued     1 g 100 mL/hr over 30 Minutes Intravenous Every 24 hours 11/27/15 1125 11/27/15 1459   11/27/15 1630  cefTAZidime (FORTAZ) 2 g in dextrose 5 % 50 mL IVPB     2 g 100 mL/hr over 30 Minutes Intravenous  Once 11/27/15 1625 11/27/15 1822   11/27/15 1200  vancomycin (VANCOCIN) 2,000 mg in sodium chloride 0.9 % 500 mL IVPB     2,000 mg 250 mL/hr over 120 Minutes Intravenous  Once 11/27/15 1103 11/27/15 1714   11/27/15 1130  ceFAZolin (ANCEF) IVPB 2g/100 mL premix  Status:  Discontinued  2 g 200 mL/hr over 30 Minutes Intravenous Every 12 hours 11/27/15 1054 11/27/15 1125   11/27/15 0600  ceFAZolin (ANCEF) IVPB 2g/100 mL premix     2 g 200 mL/hr over 30 Minutes Intravenous  Once 11/26/15 1102 11/27/15 0715   11/26/15 0600  ceFAZolin (ANCEF) IVPB 2g/100 mL premix     2 g 200 mL/hr over 30 Minutes Intravenous To Short Stay 11/25/15 1107 11/26/15 0913    .  Patient was given sequential compression devices, early ambulation, and aspirin for DVT prophylaxis.  Recent vital signs: Patient Vitals for the past 24 hrs:  BP Temp Temp src Pulse Resp SpO2 Weight  12/01/15 0557 122/63 98 F (36.7 C) Oral 80 16 94 % -  11/30/15  2145 133/71 98.5 F (36.9 C) Oral 90 16 98 % -  11/30/15 1802 (!) 114/56 99.1 F (37.3 C) Oral 89 17 96 % -  11/30/15 1149 (!) 130/54 98.2 F (36.8 C) Oral 88 18 99 % -  11/30/15 1109 (!) 142/70 98 F (36.7 C) Oral 86 17 97 % 196 lb 13.9 oz (89.3 kg)  11/30/15 1100 137/70 - - 85 - - -  11/30/15 1045 130/68 - - 86 - - -  11/30/15 1030 127/69 - - 85 - - -  11/30/15 1000 130/69 - - 84 - - -  11/30/15 0928 132/66 - - 83 - - -  11/30/15 0925 134/69 - - 85 - - -  11/30/15 0915 133/68 - - 85 - - -  11/30/15 0900 118/67 - - 84 - - -  11/30/15 0830 131/64 - - 84 - - -  11/30/15 0800 139/71 - - 84 - - -  11/30/15 0730 139/76 - - 84 - - -  11/30/15 0717 (!) 142/74 - - 85 - - -  11/30/15 0712 (!) 149/78 - - 82 - - -  11/30/15 0700 (!) 142/75 97.9 F (36.6 C) Oral 84 16 97 % 200 lb 13.4 oz (91.1 kg)  .  Recent laboratory studies: No results found.  Discharge Medications:     Medication List    STOP taking these medications   Knee Brace Misc     TAKE these medications   albuterol 108 (90 Base) MCG/ACT inhaler Commonly known as:  PROVENTIL HFA;VENTOLIN HFA Inhale 2 puffs into the lungs every 6 (six) hours as needed for wheezing or shortness of breath.   aspirin 81 MG EC tablet TAKE 1 TABLET EVERY DAY   atorvastatin 10 MG tablet Commonly known as:  LIPITOR Take one tablet by mouth once daily at 6pm for cholesterol. **Needs an appointment before anymore refills.   calcitRIOL 1 MCG/ML solution Commonly known as:  ROCALTROL Take 1 mcg by mouth every Monday, Wednesday, and Friday with hemodialysis. Calcitriol IV Injection active form of vitamin D.   cefTAZidime 2 g in dextrose 5 % 50 mL Inject 2 g into the vein every Monday, Wednesday, and Friday with hemodialysis.   fluticasone 50 MCG/ACT nasal spray Commonly known as:  FLONASE Place 2 sprays into both nostrils daily.   gabapentin 300 MG capsule Commonly known as:  NEURONTIN Take 1 capsule (300 mg total) by mouth 3 (three)  times daily.   metoprolol tartrate 25 MG tablet Commonly known as:  LOPRESSOR TAKE 1/2 TABLET TWICE A DAY   montelukast 10 MG tablet Commonly known as:  SINGULAIR TAKE 1 TABLET (10 MG TOTAL) BY MOUTH AT BEDTIME.   multivitamin Tabs tablet Take 1 tablet by mouth  daily.   oxyCODONE-acetaminophen 5-325 MG tablet Commonly known as:  PERCOCET/ROXICET Take 1 tablet by mouth every 4 (four) hours as needed for severe pain.   RENVELA 800 MG tablet Generic drug:  sevelamer carbonate Take 800-3,200 mg by mouth 3 (three) times daily with meals. 800 mg-1600 mg with snacks (depends on dietary intake) and 2400 mg-3200 mg with meals   sorbitol 70 % solution Take 30-60 mLs by mouth daily as needed (for constipation).   temazepam 30 MG capsule Commonly known as:  RESTORIL Take 30 mg by mouth at bedtime as needed. for sleep   vancomycin 1-5 GM/200ML-% Soln Commonly known as:  VANCOCIN Inject 200 mLs (1,000 mg total) into the vein every Monday, Wednesday, and Friday with hemodialysis.       Diagnostic Studies: No results found.  Patient benefited maximally from their hospital stay and there were no complications.     Disposition: 01-Home or Self Care Discharge Instructions    Call MD / Call 911    Complete by:  As directed    If you experience chest pain or shortness of breath, CALL 911 and be transported to the hospital emergency room.  If you develope a fever above 101 F, pus (white drainage) or increased drainage or redness at the wound, or calf pain, call your surgeon's office.   Call MD / Call 911    Complete by:  As directed    If you experience chest pain or shortness of breath, CALL 911 and be transported to the hospital emergency room.  If you develope a fever above 101 F, pus (white drainage) or increased drainage or redness at the wound, or calf pain, call your surgeon's office.   Constipation Prevention    Complete by:  As directed    Drink plenty of fluids.  Prune juice may  be helpful.  You may use a stool softener, such as Colace (over the counter) 100 mg twice a day.  Use MiraLax (over the counter) for constipation as needed.   Constipation Prevention    Complete by:  As directed    Drink plenty of fluids.  Prune juice may be helpful.  You may use a stool softener, such as Colace (over the counter) 100 mg twice a day.  Use MiraLax (over the counter) for constipation as needed.   Diet - low sodium heart healthy    Complete by:  As directed    Diet - low sodium heart healthy    Complete by:  As directed    Increase activity slowly as tolerated    Complete by:  As directed    Increase activity slowly as tolerated    Complete by:  As directed    Weight bearing as tolerated    Complete by:  As directed    Laterality:  left   Extremity:  Lower     Follow-up Information    [name], MD Follow up in 1 week(s).   Specialty:  Orthopedic Surgery Contact information: [place] [address] [phone]            Signed: [name] 12/01/2015, 6:56 AM

## 2015-12-01 NOTE — Evaluation (Signed)
Occupational Therapy Evaluation and Discharge Patient Details Name: Matthew Kline MRN: [id number] DOB: 1960-12-22 Today's Date: 12/01/2015    History of Present Illness  Patient is a 55 year old gentleman who sustained acute trauma to his revision total knee arthroplasty. Patient had unstable collateral ligaments has failed conservative care and presents at this time for revision of the total knee arthroplasty.  S/P TKA with hinged knee. PMH of ESRD on dialysis, DM, multiple surgeries L knee (TKA March 2016, REvision October 2016)   Clinical Impression   PTA Pt independent in ADL and mobility with RW. Pt currently min A for LB ADL and mod I for all other ADL. Pt supervision with RW. Pt assessed, and received all education, see list below. Pt at adequate level for d/c from OT perspective. No questions or concerns from Pt. OT to sign off. Thank you for this referral.     Follow Up Recommendations  No OT follow up;Supervision - Intermittent    Equipment Recommendations  None recommended by OT    Recommendations for Other Services       Precautions / Restrictions Precautions Precautions: Fall Restrictions Weight Bearing Restrictions: Yes LLE Weight Bearing: Weight bearing as tolerated      Mobility Bed Mobility               General bed mobility comments: Pt sitting EOB when OT entered the room  Transfers Overall transfer level: Needs assistance Equipment used: Rolling walker (2 wheeled) Transfers: Sit to/from Stand Sit to Stand: Min guard         General transfer comment: min guard for safety, good hand placement    Balance Overall balance assessment: Needs assistance Sitting-balance support: No upper extremity supported;Feet supported Sitting balance-Leahy Scale: Normal     Standing balance support: No upper extremity supported;Single extremity supported;During functional activity Standing balance-Leahy Scale: Fair Standing balance comment: able to  stand at sink for ADL                            ADL Overall ADL's : Needs assistance/impaired Eating/Feeding: Set up;Sitting;Modified independent   Grooming: Wash/dry face;Oral care;Min guard;Standing Grooming Details (indicate cue type and reason): sink level Upper Body Bathing: Supervision/ safety;Standing;Sitting Upper Body Bathing Details (indicate cue type and reason): discussed standing vs sitting for bathing and Pt stated that he plans on standing. Pt edcuated on safety Lower Body Bathing: Min guard   Upper Body Dressing : Modified independent;Sitting Upper Body Dressing Details (indicate cue type and reason): donned shirt Lower Body Dressing: Minimal assistance;With caregiver independent assisting;Sit to/from stand Lower Body Dressing Details (indicate cue type and reason): donned socks, and panst -Pt educated in sequence Toilet Transfer: Min Fish farm manager Details (indicate cue type and reason): simulated         Functional mobility during ADLs: Supervision/safety;Rolling walker General ADL Comments: Pt very familiar with compensatory strategies from previous surgeries.     Vision Vision Assessment?: No apparent visual deficits   Perception     Praxis      Pertinent Vitals/Pain Pain Assessment: 0-10 Pain Score: 6  Pain Location: L knee Pain Descriptors / Indicators: Sore;Grimacing Pain Intervention(s): Limited activity within patient's tolerance;Repositioned;Monitored during session     Hand Dominance     Extremity/Trunk Assessment Upper Extremity Assessment Upper Extremity Assessment: Overall WFL for tasks assessed   Lower Extremity Assessment Lower Extremity Assessment: LLE deficits/detail LLE Deficits / Details: s/p decreased ROM and strength  Communication Communication Communication: No difficulties   Cognition Arousal/Alertness: Awake/alert Behavior During Therapy: WFL for tasks assessed/performed Overall Cognitive  Status: Within Functional Limits for tasks assessed                     General Comments       Exercises       Shoulder Instructions      Home Living Family/patient expects to be discharged to:: Private residence Living Arrangements: Spouse/significant other;Children Available Help at Discharge: Family;Available PRN/intermittently;Other (Comment) (most of the time) Type of Home: House Home Access: Stairs to enter [employer] of Steps: 2 Entrance Stairs-Rails: Right;Left Home Layout: One level     Bathroom Shower/Tub: Tub/shower unit Shower/tub characteristics: Architectural technologist: Standard Bathroom Accessibility: Yes How Accessible: Accessible via walker Home Equipment: [place] - 2 wheels   Additional Comments: Educated on 3 in 1 and Pt declined      Prior Functioning/Environment Level of Independence: Independent;Independent with assistive device(s)                 OT Problem List: Decreased strength;Decreased range of motion;Decreased activity tolerance;Impaired balance (sitting and/or standing);Pain   OT Treatment/Interventions:      OT Goals(Current goals can be found in the care plan section) Acute Rehab OT Goals Patient Stated Goal: back doing all that I used to do, strengthen LLE OT Goal Formulation: With patient Time For Goal Achievement: 12/08/15 Potential to Achieve Goals: Good  OT Frequency:     Barriers to D/C:            Co-evaluation              End of Session Equipment Utilized During Treatment: Rolling walker Nurse Communication: Mobility status  Activity Tolerance: Patient tolerated treatment well Patient left: in bed;with call bell/phone within reach   Time: 0919-0940 OT Time Calculation (min): 21 min Charges:  OT General Charges $OT Visit: 1 Procedure OT Evaluation $OT Eval Low Complexity: 1 Procedure G-Codes:    Merri Ray Zeshan Sena 12/24/15, 10:32 AM  Hulda Humphrey OTR/L [phone]

## 2015-12-01 NOTE — Care Management Important Message (Signed)
Important Message  Patient Details  Name: Matthew Kline MRN: [id number] Date of Birth: [birth year]   Medicare Important Message Given:  Yes    Cassadi Purdie, Rory Percy, RN 12/01/2015, 10:37 AM

## 2015-12-01 NOTE — Progress Notes (Signed)
[facility] Progress Note   Dialysis Orders: [facility] MWF 4h 91.5kg 2/2 bath Hep 4000 LUA AVF - Hect 2 ug tiw - Mircera 50 ug q 2wks (last 10/18) - Venofer 100/ hd (last 11/22) - Hb 10.8 CA 10.3 P 7 pth 154  Assessment/Plan: 1. S/p left TKR revision gram neg on gram stain with no growth on culture - for  6 wks abtx through 12/26 -Vanc and Fortaz at outpt HD- then doxy and keflex 3-6 months 2. ESRD - MWF - had HD yesterday - no problems except cannulation - next HD Tuesday at outpt unit per holiday shcedule 3. Anemia/ABLA - hgb 7.6 - had 100 Aranesp 11/17 - course of Fe started 11/17 through 11/22- resume Mircera dosing at outpt unit this week 4. Secondary hyperparathyroidism - not clear why getting calcitriol here - when he is on hectorol 2 as an outpt - will change back to hectorol at d/c 5. HTN/volume - controlled. Net UF 1.2 on Sunday; post weight below edw-  6. Disp - d/c today  Myriam Jacobson, PA-C [place] 12/01/2015,8:45 AM  LOS: 5 days  I have seen and examined this patient and agree with plan per Amalia Hailey.  Note plans for DC.  He is to FU at his HD unit tomorrow for HD.  Marland Kitchen Chirag Krueger T,MD 12/01/2015 9:20 AM Subjective:   Happy to go home  Objective Vitals:   11/30/15 1149 11/30/15 1802 11/30/15 2145 12/01/15 0557  BP: (!) 130/54 (!) 114/56 133/71 122/63  Pulse: 88 89 90 80  Resp: 18 17 16 16   Temp: 98.2 F (36.8 C) 99.1 F (37.3 C) 98.5 F (36.9 C) 98 F (36.7 C)  TempSrc: Oral Oral Oral Oral  SpO2: 99% 96% 98% 94%  Weight:      Height:       Physical Exam General: NAD, talkative Heart: RRR Lungs: no rales Abdomen: soft NT Extremities: no RLE edema, left knee with VAC - generalized edema Dialysis Access: left upper AVF + bruit - (looks like small infiltration yesterday with bruising and hard area)   Additional Objective Labs: Basic Metabolic Panel:  Recent Labs Lab 11/26/15 1237  11/26/15 1429 11/26/15 1854 11/30/15 0724  NA 138 138 136 135  K 5.2* 6.0* 4.7 4.2  CL 100*  --  99* 96*  CO2 26  --  25 25  GLUCOSE 165* 134* 117* 110*  BUN 61*  --  40* 51*  CREATININE 8.53*  --  5.88* 7.64*  CALCIUM 8.8*  --  8.7* 9.3  PHOS  --   --  4.4 6.0*   Liver Function Tests:  Recent Labs Lab 11/26/15 1854 11/30/15 0724  ALBUMIN 3.3* 2.8*   CBC:  Recent Labs Lab 11/26/15 1854 11/27/15 0628 11/28/15 0429 11/29/15 0626 11/30/15 0723  WBC 5.5 8.9 9.1 8.9 7.5  NEUTROABS  --  5.6 6.5 5.8  --   HGB 8.5* 8.7* 8.0* 7.8* 7.6*  HCT 25.2* 25.9* 24.2* 24.0* 23.7*  MCV 87.2 88.4 90.0 90.9 90.5  PLT QUESTIONABLE RESULTS, RECOMMEND RECOLLECT TO VERIFY 119* 111* 127* 160   Blood Culture    Component Value Date/Time   SDES TISSUE LEFT KNEE 11/26/2015 1039   SPECREQUEST DEEP LEFT KNEE SPECIMEN A 11/26/2015 1039   CULT  11/26/2015 1039    NO GROWTH 4 DAYS NO ANAEROBES ISOLATED; CULTURE IN PROGRESS FOR 5 DAYS   REPTSTATUS PENDING 11/26/2015 1039    CBG:  Recent Labs Lab 11/29/15  2120 11/30/15 1211 11/30/15 1800 11/30/15 2143 12/01/15 0744  GLUCAP 165* 117* 134* 132* 118*   Medications: . sodium chloride Stopped (11/28/15 2047)   . sodium chloride   Intravenous Once  . aspirin EC  325 mg Oral Q breakfast  . calcitRIOL  1 mcg Oral Q M,W,F-HD  . cefTAZidime (FORTAZ)  IV  2 g Intravenous Q M,W,F-HD  . darbepoetin (ARANESP) injection - DIALYSIS  100 mcg Intravenous Q Fri-HD  . docusate sodium  100 mg Oral BID  . ferric gluconate (FERRLECIT/NULECIT) IV  125 mg Intravenous Q M,W,F-HD  . gabapentin  300 mg Oral BID  . insulin aspart  0-15 Units Subcutaneous TID WC  . montelukast  10 mg Oral QHS  . multivitamin  1 tablet Oral QHS  . mupirocin ointment  1 application Nasal BID  . sevelamer carbonate  2,400 mg Oral TID WC  . vancomycin  1,000 mg Intravenous Q M,W,F-HD

## 2015-12-01 NOTE — Telephone Encounter (Signed)
U call. Patient is to have the wound VAC removed and a dry dressing applied could use Mepilex or 4 x 4's plus Ace wrap.

## 2015-12-01 NOTE — Progress Notes (Signed)
Physical Therapy Treatment Patient Details Name: Matthew Kline MRN: [id number] DOB: Mar 04, 1960 Today's Date: 12/01/2015    History of Present Illness  Patient is a 55 year old gentleman who sustained acute trauma to his revision total knee arthroplasty. Patient had unstable collateral ligaments has failed conservative care and presents at this time for revision of the total knee arthroplasty.  S/P TKA with hinged knee. PMH of ESRD on dialysis, DM, multiple surgeries L knee (TKA March 2016, REvision October 2016)    PT Comments    Pt is acutely improving despite his 6th surgery to L knee with recent trauma.  He is motivated and worked to walk fast, asked him to control speed until his proprioceptive awareness of L knee catches up to him.  Continue acute therapy as needed for there ex and gait and transition to HHPT.  Follow Up Recommendations  Home health PT     Equipment Recommendations  None recommended by PT    Recommendations for Other Services       Precautions / Restrictions Precautions Precautions: Fall Precaution Comments: vac removed Restrictions Weight Bearing Restrictions: Yes LLE Weight Bearing: Weight bearing as tolerated Other Position/Activity Restrictions: monitor L knee as it tends to buckle    Mobility  Bed Mobility Overal bed mobility: Modified Independent             General bed mobility comments: able to get back into and off bed alone  Transfers Overall transfer level: Modified independent Equipment used: Rolling walker (2 wheeled) Transfers: Sit to/from [employer] Sit to Stand: Min guard Stand pivot transfers: Min guard       General transfer comment: safety cues and used gait belt  Ambulation/Gait Ambulation/Gait assistance: Min guard Ambulation Distance (Feet): 120 Feet Assistive device: Rolling walker (2 wheeled) Gait Pattern/deviations: Step-through pattern;Wide base of support;Trunk flexed;Decreased weight  shift to left Gait velocity: normal but fast for L Knee strength Gait velocity interpretation: Below normal speed for age/gender General Gait Details: BP was better today, 140/72 and no dizziness   Stairs            Wheelchair Mobility    Modified Rankin (Stroke Patients Only)       Balance Overall balance assessment: Needs assistance Sitting-balance support: Feet supported Sitting balance-Leahy Scale: Good     Standing balance support: Bilateral upper extremity supported Standing balance-Leahy Scale: Fair Standing balance comment: able to stand at sink for ADL                    Cognition Arousal/Alertness: Awake/alert Behavior During Therapy: WFL for tasks assessed/performed Overall Cognitive Status: Within Functional Limits for tasks assessed                      Exercises Total Joint Exercises Ankle Circles/Pumps: AROM;Left;5 reps Heel Slides: Strengthening;Left;15 reps Hip ABduction/ADduction: AROM;AAROM;Left;10 reps Long Arc Quad: Strengthening;Left;15 reps    General Comments        Pertinent Vitals/Pain Pain Assessment: Faces Pain Score: 6  Faces Pain Scale: Hurts little more Pain Location: L knee with gait Pain Descriptors / Indicators: Operative site guarding Pain Intervention(s): Limited activity within patient's tolerance;Monitored during session;Premedicated before session;Repositioned    Home Living Family/patient expects to be discharged to:: Private residence Living Arrangements: Spouse/significant other;Children Available Help at Discharge: Family;Available PRN/intermittently;Other (Comment) (most of the time) Type of Home: House Home Access: Stairs to enter Entrance Stairs-Rails: Right;Left Home Layout: One level Home Equipment: Environmental consultant - 2 wheels Additional Comments: Educated  on 3 in 1 and Pt declined    Prior Function Level of Independence: Independent;Independent with assistive device(s)          PT Goals  (current goals can now be found in the care plan section) Acute Rehab PT Goals Patient Stated Goal: recover mobility Progress towards PT goals: Progressing toward goals    Frequency    7X/week      PT Plan Current plan remains appropriate    Co-evaluation             End of Session Equipment Utilized During Treatment: Gait belt Activity Tolerance: Patient tolerated treatment well;Patient limited by fatigue Patient left: in bed;with call bell/phone within reach;with bed alarm set     Time: 1009-1051 PT Time Calculation (min) (ACUTE ONLY): 42 min  Charges:  $Gait Training: 8-22 mins $Therapeutic Exercise: 8-22 mins $Therapeutic Activity: 8-22 mins                    G Codes:      Ramond Dial Dec 23, 2015, 1:18 PM    Mee Hives, PT MS Acute Rehab Dept. Number: [place] [facility] [place]

## 2015-12-07 ENCOUNTER — Other Ambulatory Visit: Payer: Self-pay | Admitting: Internal Medicine

## 2015-12-09 ENCOUNTER — Ambulatory Visit (INDEPENDENT_AMBULATORY_CARE_PROVIDER_SITE_OTHER): Payer: Medicare HMO | Admitting: Orthopedic Surgery

## 2015-12-09 ENCOUNTER — Ambulatory Visit (INDEPENDENT_AMBULATORY_CARE_PROVIDER_SITE_OTHER): Payer: Medicare HMO

## 2015-12-09 ENCOUNTER — Encounter (INDEPENDENT_AMBULATORY_CARE_PROVIDER_SITE_OTHER): Payer: Self-pay | Admitting: Orthopedic Surgery

## 2015-12-09 VITALS — Ht 71.0 in | Wt 196.0 lb

## 2015-12-09 DIAGNOSIS — Z96652 Presence of left artificial knee joint: Secondary | ICD-10-CM

## 2015-12-09 NOTE — Progress Notes (Signed)
Office Visit Note   Patient: Matthew Kline           Date of Birth: Apr 30, 1960           MRN: [id number] Visit Date: 12/09/2015              Requested by: Donald Prose, MD [address], [place] [address] PCP: Lynne Logan, MD   Assessment & Plan: Visit Diagnoses:  1. Total knee replacement status, left     Plan: Patient is 2 weeks status post hinge total knee replacement. He is making excellent progress we will harvest the sutures apply Steri-Strips is given a prescription to go to [facility] on [address] for his outpatient physical therapy.  Follow-Up Instructions: Return in about 3 weeks (around 12/30/2015).   Orders:  Orders Placed This Encounter  Procedures  . XR Knee 1-2 Views Left   No orders of the defined types were placed in this encounter.     Procedures: No procedures performed   Clinical Data: No additional findings.   Subjective: Chief Complaint  Patient presents with  . Right Knee - Routine Post Op    Removal left total knee arthroplasty, hinged total knee arthroplasty 11/26/15    Patient is here following up on removal left total knee arthroplasty , hinged total knee arthroplasty. He is doing well overall. Sutures are intact. There is no drainage. Dry dressing changes daily.    Review of Systems   Objective: Vital Signs: Ht 5\' 11"  (1.803 m)   Wt 196 lb (88.9 kg)   BMI 27.34 kg/m   Physical Exam Examination patient does have some venous stasis swelling left lower extremity is no redness no synovitis no drainage no signs of infection. The wound edges are well approximated. Patient has range of motion from 0-100 he is making excellent improvement. He is not using narcotic pain medicine in the last 5 days. Ortho Exam  Specialty Comments:  No specialty comments available.  Imaging: Xr Knee 1-2 Views Left  Result Date: 12/09/2015 Two-view radiographs of the left total knee-knee replacement show  stable alignment both AP and lateral planes with no complicating features.    PMFS History: Patient Active Problem List   Diagnosis Date Noted  . S/P revision of total knee 11/26/2015  . Pain in the chest   . Acute on chronic diastolic heart failure ([place]) 07/05/2015  . Volume overload 07/04/2015  . Shortness of breath 07/04/2015  . Hypoxemia 07/04/2015  . Elevated troponin   . End-stage renal disease on hemodialysis ([place])   . Hypervolemia   . 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 Septic joint of left knee joint ([place]) 08/07/2014  . Tachycardia 07/24/2014  . Acute upper respiratory infection 07/24/2014  . Sepsis ([place]) 07/14/2014  . ESRD (end stage renal disease) ([place]) 07/14/2014  . Type II diabetes mellitus ([place]) 07/14/2014  . Anemia in chronic kidney disease 07/14/2014  . Infection of total knee replacement ([place]) 05/31/2014  . Surgical wound dehiscence 05/09/2014  . Dehiscence of closure of skin 05/09/2014  . Total knee replacement status 04/10/2014  . Diabetes mellitus with renal manifestations, controlled ([place]) 10/24/2013  . Hypertensive renal disease 06/27/2013  . DM type 2 causing vascular disease ([place]) 06/27/2013  . Erectile dysfunction 06/27/2013  . Depression 06/27/2013  . Claudication of left lower extremity ([place]) 12/19/2012  . Essential hypertension, benign 12/19/2012  . Sinusitis, acute maxillary 11/22/2012  . Otitis, externa, infective 11/14/2012  . Leg edema,  left 11/14/2012  . End stage renal disease ([place]) 10/02/2012  . Controlled type 2 DM with proteinuria or microalbuminuria 09/19/2012  . GERD (gastroesophageal reflux disease) 09/19/2012  . Leukocytosis, unspecified 09/19/2012  . Lacunar infarction ([place]) 08/17/2012  . Polymyalgia rheumatica ([place]) 08/17/2012  . 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 Unspecified essential hypertension 05/10/2012  . Unspecified vitamin D deficiency 05/10/2012  . Diabetes mellitus due to underlying condition ([place])  05/10/2012  . Hyperlipidemia LDL goal <100 05/10/2012  . Anemia of chronic disease 05/10/2012  . Screening for prostate cancer 05/10/2012  . Chronic kidney disease (CKD), stage IV (severe) ([place]) 05/10/2012  . Peripheral autonomic neuropathy due to DM ([place]) 05/10/2012  . Callus of foot 05/10/2012  . Urgency of urination 05/10/2012  . Hyperkalemia 05/10/2012  . Candidiasis of the esophagus 10/12/2011  . Internal hemorrhoids without mention of complication 72/62/0355  . Pre-syncope 07/25/2009  . DJD (degenerative joint disease) of cervical spine 02/17/2009   Past Medical History:  Diagnosis Date  . Allergy   . Anemia, unspecified   . Anxiety   . Arthralgia 2010   polyarticular  . Arthritis    "back, knees" (05/09/2014)  . CHF (congestive heart failure) ([place])   . CHF (congestive heart failure) ([place]) 07/25/2009   denies  . Chronic lower back pain   . Coughing    pt. reports that he has drainage from sinus infection  . Diabetic neuropathy ([place])   . ESRD (end stage renal disease) on dialysis [facility])    started 12/2012; "MWF; [address] "   . GERD (gastroesophageal reflux disease)    hx "before I lost weight", no problem 9 years  . Hemodialysis access site with mature fistula ([place])   . Hemorrhoids, internal 10/2011   small  . History of blood transfusion    "related to the anemia"  . Hypertension   . Insomnia, unspecified   . Knee pain, left   . Lacunar infarction ([place]) 2006   RUE/RLE, speech  . Long term (current) use of anticoagulants   . Myocardial infarction 1995  . Orthostatic hypotension   . Osteomyelitis of foot, left, acute ([place])   . Other chronic postoperative pain   . Pneumonia    "probably 4-5 times" (05/09/2014)  . Polymyalgia rheumatica ([place])   . Renal insufficiency   . Sleep apnea    "lost weight; no more problem" (05/09/2014)  . Stroke ([place]) 01/10/06   denies residual on 05/09/2014  . Type II diabetes mellitus ([place]) dx'd 1995  . Ulcer ([place])    diabetic  foot   . Unspecified hereditary and idiopathic peripheral neuropathy    feet  . Unspecified osteomyelitis, site unspecified   . Unspecified vitamin D deficiency     Family History  Problem Relation Age of Onset  . Hypertension Mother   . Cancer Mother 66    Ovarian  . Heart disease Maternal Aunt   . Stroke Maternal Grandfather     Past Surgical History:  Procedure Laterality Date  . AMPUTATION  01/21/2012   Procedure: AMPUTATION RAY;  Surgeon: [name], MD;  Location: [facility];  Service: Orthopedics;  Laterality: Left;  Left Foot 4th Ray Amputation  . AMPUTATION Left 05/04/2013   Procedure: AMPUTATION DIGIT;  Surgeon: [name], MD;  Location: [place];  Service: Orthopedics;  Laterality: Left;  Left Great Toe Amputation at [facility]  . ANTERIOR CERVICAL DECOMP/DISCECTOMY FUSION  02/2011  . BASCILIC VEIN TRANSPOSITION Left 10/19/2012   Procedure: BASCILIC  VEIN TRANSPOSITION;  Surgeon: Serafina Mitchell, MD;  Location: [place];  Service: Vascular;  Laterality: Left;  . EXCISIONAL TOTAL KNEE ARTHROPLASTY WITH ANTIBIOTIC SPACERS Left 08/07/2014   Procedure: Replace Left Total Knee Arthroplasty,  Place Antibiotic Spacer;  Surgeon: [name], MD;  Location: [facility];  Service: Orthopedics;  Laterality: Left;  . I&D EXTREMITY Left 05/09/2014   Procedure: Irrigation and Debridement Left Knee and Closure of Total Knee Arthroplasty Incision;  Surgeon: [name], MD;  Location: [place];  Service: Orthopedics;  Laterality: Left;  . I&D KNEE WITH POLY EXCHANGE Left 05/31/2014   Procedure: IRRIGATION AND DEBRIDEMENT LEFT KNEE, PLACE ANTIBIOTIC BEADS,  POLY EXCHANGE;  Surgeon: [name], MD;  Location: [place];  Service: Orthopedics;  Laterality: Left;  . KNEE ARTHROSCOPY Left 08-25-2012  . REFRACTIVE SURGERY Bilateral   . TOE AMPUTATION Bilateral    "I've lost 7 toes over the last 7 years" (05/09/2014)  . TOE SURGERY Left April 2015   Big toe removed on left foot.  . TONSILLECTOMY    . TOTAL KNEE  ARTHROPLASTY Left 04/10/2014   Procedure: TOTAL KNEE ARTHROPLASTY;  Surgeon: [name], MD;  Location: [facility];  Service: Orthopedics;  Laterality: Left;  . TOTAL KNEE REVISION Left 10/25/2014   Procedure: LEFT TOTAL KNEE REVISION;  Surgeon: [name], MD;  Location: [place];  Service: Orthopedics;  Laterality: Left;  . TOTAL KNEE REVISION Left 11/26/2015   Procedure: Removal Left Total Knee Arthroplasty, Hinged Total Knee Arthroplasty;  Surgeon: [name], MD;  Location: [place];  Service: Orthopedics;  Laterality: Left;  . UVULOPALATOPHARYNGOPLASTY, TONSILLECTOMY AND SEPTOPLASTY  ~ 1989  . WOUND DEBRIDEMENT Left 05/09/2014   Dehiscence Left Total Knee Arthroplasty Incision   Social History   Occupational History  . Not on file.   Social History Main Topics  . Smoking status: Current Every Day Smoker    Packs/day: 0.12    Years: 32.00    Types: Cigarettes  . Smokeless tobacco: Former Systems developer    Quit date: 10/06/2014     Comment: 3 -4 cig daily   . Alcohol use No  . Drug use: No  . Sexual activity: Yes

## 2015-12-16 ENCOUNTER — Ambulatory Visit (INDEPENDENT_AMBULATORY_CARE_PROVIDER_SITE_OTHER): Payer: Medicare HMO | Admitting: Orthopedic Surgery

## 2015-12-16 ENCOUNTER — Encounter (INDEPENDENT_AMBULATORY_CARE_PROVIDER_SITE_OTHER): Payer: Self-pay | Admitting: Orthopedic Surgery

## 2015-12-16 VITALS — Ht 71.0 in | Wt 196.0 lb

## 2015-12-16 DIAGNOSIS — Z96659 Presence of unspecified artificial knee joint: Secondary | ICD-10-CM

## 2015-12-16 DIAGNOSIS — T84018S Broken internal joint prosthesis, other site, sequela: Secondary | ICD-10-CM

## 2015-12-16 NOTE — Progress Notes (Signed)
Office Visit Note   Patient: Matthew Kline           Date of Birth: January 01, 1961           MRN: [id number] Visit Date: 12/16/2015              Requested by: Donald Prose, MD [address] [place], [place] [address] PCP: Lynne Logan, MD   Assessment & Plan: Visit Diagnoses: No diagnosis found.  Plan: Patient is status post revision to a hinged total knee arthroplasty on the left. The incision is healed quite nicely we will harvest the remainder of the sutures continue with compression recommend that he wear his compression stockings daily he is to start outpatient physical therapy on Thursday follow-up in 4 weeks.  Obtain two-view radiographs of the left knee at follow-up.  Follow-Up Instructions: No Follow-up on file.   Orders:  No orders of the defined types were placed in this encounter.  No orders of the defined types were placed in this encounter.     Procedures: No procedures performed   Clinical Data: No additional findings.   Subjective: Chief Complaint  Patient presents with  . Left Knee - Routine Post Op    11/26/15 revision of a left total knee    Pt states that he is feeling well that he is doing physical therapy with [facility] several times a week and that he is doing well. Ambulates with cane and states that he is not having to take anything for pain. He said it is rare. He will use Percocet if needed but that it " doesn't really do anything anyway"     Review of Systems   Objective: Vital Signs: Ht 5\' 11"  (1.803 m)   Wt 196 lb (88.9 kg)   BMI 27.34 kg/m   Physical ExamOrtho Exam  on examination patient is alert oriented no adenopathy his knee has some mild effusion. Range of motion 0-100. There is no redness no cellulitis no drainage no signs of infection. The incision is well-healed. Patient is quite pleased with his progress.  Specialty Comments:  No specialty comments available.  Imaging: No results found.   PMFS  History: Patient Active Problem List   Diagnosis Date Noted  . S/P revision of total knee 11/26/2015  . Pain in the chest   . Acute on chronic diastolic heart failure ([place]) 07/05/2015  . Volume overload 07/04/2015  . Shortness of breath 07/04/2015  . Hypoxemia 07/04/2015  . Elevated troponin   . End-stage renal disease on hemodialysis ([place])   . Hypervolemia   . 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 Septic joint of left knee joint ([place]) 08/07/2014  . Tachycardia 07/24/2014  . Acute upper respiratory infection 07/24/2014  . Sepsis ([place]) 07/14/2014  . ESRD (end stage renal disease) ([place]) 07/14/2014  . Type II diabetes mellitus ([place]) 07/14/2014  . Anemia in chronic kidney disease 07/14/2014  . Infection of total knee replacement ([place]) 05/31/2014  . Surgical wound dehiscence 05/09/2014  . Dehiscence of closure of skin 05/09/2014  . Total knee replacement status 04/10/2014  . Diabetes mellitus with renal manifestations, controlled ([place]) 10/24/2013  . Hypertensive renal disease 06/27/2013  . DM type 2 causing vascular disease ([place]) 06/27/2013  . Erectile dysfunction 06/27/2013  . Depression 06/27/2013  . Claudication of left lower extremity ([place]) 12/19/2012  . Essential hypertension, benign 12/19/2012  . Sinusitis, acute maxillary 11/22/2012  . Otitis, externa, infective 11/14/2012  . Leg edema,  left 11/14/2012  . End stage renal disease ([place]) 10/02/2012  . Controlled type 2 DM with proteinuria or microalbuminuria 09/19/2012  . GERD (gastroesophageal reflux disease) 09/19/2012  . Leukocytosis, unspecified 09/19/2012  . Lacunar infarction ([place]) 08/17/2012  . Polymyalgia rheumatica ([place]) 08/17/2012  . 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 Unspecified essential hypertension 05/10/2012  . Unspecified vitamin D deficiency 05/10/2012  . Diabetes mellitus due to underlying condition ([place]) 05/10/2012  . Hyperlipidemia LDL goal <100 05/10/2012  . Anemia of chronic disease  05/10/2012  . Screening for prostate cancer 05/10/2012  . Chronic kidney disease (CKD), stage IV (severe) ([place]) 05/10/2012  . 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 Callus of foot 05/10/2012  . Urgency of urination 05/10/2012  . Hyperkalemia 05/10/2012  . Candidiasis of the esophagus 10/12/2011  . Internal hemorrhoids without mention of complication 11/91/4782  . Pre-syncope 07/25/2009  . DJD (degenerative joint disease) of cervical spine 02/17/2009   Past Medical History:  Diagnosis Date  . Allergy   . Anemia, unspecified   . Anxiety   . Arthralgia 2010   polyarticular  . Arthritis    "back, knees" (05/09/2014)  . CHF (congestive heart failure) ([place])   . CHF (congestive heart failure) ([place]) 07/25/2009   denies  . Chronic lower back pain   . Coughing    pt. reports that he has drainage from sinus infection  . Diabetic neuropathy ([place])   . ESRD (end stage renal disease) on dialysis [facility])    started 12/2012; "MWF; [facility] "   . GERD (gastroesophageal reflux disease)    hx "before I lost weight", no problem 9 years  . Hemodialysis access site with mature fistula ([place])   . Hemorrhoids, internal 10/2011   small  . History of blood transfusion    "related to the anemia"  . Hypertension   . Insomnia, unspecified   . Knee pain, left   . Lacunar infarction ([place]) 2006   RUE/RLE, speech  . Long term (current) use of anticoagulants   . Myocardial infarction 1995  . Orthostatic hypotension   . Osteomyelitis of foot, left, acute ([place])   . Other chronic postoperative pain   . Pneumonia    "probably 4-5 times" (05/09/2014)  . Polymyalgia rheumatica ([place])   . Renal insufficiency   . Sleep apnea    "lost weight; no more problem" (05/09/2014)  . Stroke ([place]) 01/10/06   denies residual on 05/09/2014  . Type II diabetes mellitus ([place]) dx'd 1995  . Ulcer ([place])    diabetic foot   . Unspecified hereditary and idiopathic peripheral neuropathy    feet  .  Unspecified osteomyelitis, site unspecified   . Unspecified vitamin D deficiency     Family History  Problem Relation Age of Onset  . Hypertension Mother   . Cancer Mother 60    Ovarian  . Heart disease Maternal Aunt   . Stroke Maternal Grandfather     Past Surgical History:  Procedure Laterality Date  . AMPUTATION  01/21/2012   Procedure: AMPUTATION RAY;  Surgeon: [name], MD;  Location: [name];  Service: Orthopedics;  Laterality: Left;  Left Foot 4th Ray Amputation  . AMPUTATION Left 05/04/2013   Procedure: AMPUTATION DIGIT;  Surgeon: [name], MD;  Location: [place];  Service: Orthopedics;  Laterality: Left;  Left Great Toe Amputation at [facility]  . ANTERIOR CERVICAL DECOMP/DISCECTOMY FUSION  02/2011  . BASCILIC VEIN TRANSPOSITION Left 10/19/2012   Procedure: BASCILIC  VEIN TRANSPOSITION;  Surgeon: Serafina Mitchell, MD;  Location: [place];  Service: Vascular;  Laterality: Left;  . EXCISIONAL TOTAL KNEE ARTHROPLASTY WITH ANTIBIOTIC SPACERS Left 08/07/2014   Procedure: Replace Left Total Knee Arthroplasty,  Place Antibiotic Spacer;  Surgeon: [name], MD;  Location: [place];  Service: Orthopedics;  Laterality: Left;  . I&D EXTREMITY Left 05/09/2014   Procedure: Irrigation and Debridement Left Knee and Closure of Total Knee Arthroplasty Incision;  Surgeon: [name], MD;  Location: [place];  Service: Orthopedics;  Laterality: Left;  . I&D KNEE WITH POLY EXCHANGE Left 05/31/2014   Procedure: IRRIGATION AND DEBRIDEMENT LEFT KNEE, PLACE ANTIBIOTIC BEADS,  POLY EXCHANGE;  Surgeon: [name], MD;  Location: [place];  Service: Orthopedics;  Laterality: Left;  . KNEE ARTHROSCOPY Left 08-25-2012  . REFRACTIVE SURGERY Bilateral   . TOE AMPUTATION Bilateral    "I've lost 7 toes over the last 7 years" (05/09/2014)  . TOE SURGERY Left April 2015   Big toe removed on left foot.  . TONSILLECTOMY    . TOTAL KNEE ARTHROPLASTY Left 04/10/2014   Procedure: TOTAL KNEE ARTHROPLASTY;  Surgeon: [name], MD;  Location: [place];  Service: Orthopedics;  Laterality: Left;  . TOTAL KNEE REVISION Left 10/25/2014   Procedure: LEFT TOTAL KNEE REVISION;  Surgeon: [name], MD;  Location: [place];  Service: Orthopedics;  Laterality: Left;  . TOTAL KNEE REVISION Left 11/26/2015   Procedure: Removal Left Total Knee Arthroplasty, Hinged Total Knee Arthroplasty;  Surgeon: [name], MD;  Location: [place];  Service: Orthopedics;  Laterality: Left;  . UVULOPALATOPHARYNGOPLASTY, TONSILLECTOMY AND SEPTOPLASTY  ~ 1989  . WOUND DEBRIDEMENT Left 05/09/2014   Dehiscence Left Total Knee Arthroplasty Incision   Social History   Occupational History  . Not on file.   Social History Main Topics  . Smoking status: Current Every Day Smoker    Packs/day: 0.12    Years: 32.00    Types: Cigarettes  . Smokeless tobacco: Former Systems developer    Quit date: 10/06/2014     Comment: 3 -4 cig daily   . Alcohol use No  . Drug use: No  . Sexual activity: Yes

## 2016-01-13 ENCOUNTER — Ambulatory Visit (INDEPENDENT_AMBULATORY_CARE_PROVIDER_SITE_OTHER): Payer: Medicare HMO | Admitting: Orthopedic Surgery

## 2016-01-15 ENCOUNTER — Ambulatory Visit (INDEPENDENT_AMBULATORY_CARE_PROVIDER_SITE_OTHER): Payer: Medicare HMO | Admitting: Orthopedic Surgery

## 2016-01-15 ENCOUNTER — Encounter (INDEPENDENT_AMBULATORY_CARE_PROVIDER_SITE_OTHER): Payer: Self-pay | Admitting: Orthopedic Surgery

## 2016-01-15 VITALS — Ht 71.0 in | Wt 196.0 lb

## 2016-01-15 DIAGNOSIS — Z96659 Presence of unspecified artificial knee joint: Secondary | ICD-10-CM

## 2016-01-15 DIAGNOSIS — T84018S Broken internal joint prosthesis, other site, sequela: Secondary | ICD-10-CM

## 2016-01-15 MED ORDER — DOXYCYCLINE HYCLATE 100 MG PO TABS: 100.0000 mg | ORAL_TABLET | Freq: Two times a day (BID) | ORAL | 0 refills | Status: DC

## 2016-01-15 NOTE — Progress Notes (Signed)
Office Visit Note   Patient: Matthew Kline           Date of Birth: 1960-05-22           MRN: [id number] Visit Date: 01/15/2016              Requested by: Donald Prose, MD [place], [place] [address] PCP: Lynne Logan, MD  Chief Complaint  Patient presents with  . Left Knee - Routine Post Op    11/26/15 Removal Left Total Knee Arthroplasty,  Hinged Total Knee Arthroplasty left Placement of Prevena wound VAC    HPI: Pt complains of the knee "giving way" and that he has had a few falls because of this. He states that it has not been "anything major" but that he is concerned about this. He walks in today with assistive device. He is doing physical therapy and states that he requires another rx for this otherwise today was his last therapy note visit. Autumn L Forrest, RMA   Patient is going to outpatient physical therapy and still has some weakness. Assessment & Plan: Visit Diagnoses:  1. Failed total knee arthroplasty, sequela     Plan: Continue with outpatient physical therapy continue strengthening . Recommend patient wear his compression stockings daily. Will call in a prescription for doxycycline for a month. Patient states he's had no problems taking the doxycycline in the past.  Follow-Up Instructions: Return in about 4 weeks (around 02/12/2016).   Physical Exam: On examination patient is alert oriented no adenopathy well-dressed normal affect normal respiratory effort he does have an antalgic gait. Examination patient has active extension about 10 short of neutral passively he has full extension patient has active flexion 120. He does have swelling there is no redness he does have some venous stasis changes in his leg patient is not wearing his compression stockings today. Patient has completed his IV antibiotics we'll advance him to oral antibiotics.  Imaging: No results found.  Orders:  No orders of the defined types were placed in this  encounter.  Meds ordered this encounter  Medications  . doxycycline (VIBRA-TABS) 100 MG tablet    Sig: Take 1 tablet (100 mg total) by mouth 2 (two) times daily.    Dispense:  60 tablet    Refill:  0     Procedures: No procedures performed  Clinical Data: No additional findings.  Subjective: Review of Systems  Objective: Vital Signs: Ht 5\' 11"  (1.803 m)   Wt 196 lb (88.9 kg)   BMI 27.34 kg/m   Specialty Comments:  No specialty comments available.  PMFS History: Patient Active Problem List   Diagnosis Date Noted  . S/P revision of total knee 11/26/2015  . Pain in the chest   . Acute on chronic diastolic heart failure ([place]) 07/05/2015  . Volume overload 07/04/2015  . Shortness of breath 07/04/2015  . Hypoxemia 07/04/2015  . Elevated troponin   . End-stage renal disease on hemodialysis ([place])   . Hypervolemia   . 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 Septic joint of left knee joint ([place]) 08/07/2014  . Tachycardia 07/24/2014  . Acute upper respiratory infection 07/24/2014  . Sepsis ([place]) 07/14/2014  . ESRD (end stage renal disease) ([place]) 07/14/2014  . Type II diabetes mellitus ([place]) 07/14/2014  . Anemia in chronic kidney disease 07/14/2014  . Infection of total knee replacement ([place]) 05/31/2014  . Surgical wound dehiscence 05/09/2014  . Dehiscence of closure of skin 05/09/2014  .  Total knee replacement status 04/10/2014  . Diabetes mellitus with renal manifestations, controlled ([place]) 10/24/2013  . Hypertensive renal disease 06/27/2013  . DM type 2 causing vascular disease ([place]) 06/27/2013  . Erectile dysfunction 06/27/2013  . Depression 06/27/2013  . Claudication of left lower extremity ([place]) 12/19/2012  . Essential hypertension, benign 12/19/2012  . Sinusitis, acute maxillary 11/22/2012  . Otitis, externa, infective 11/14/2012  . Leg edema, left 11/14/2012  . End stage renal disease ([place]) 10/02/2012  . Controlled type 2 DM with proteinuria or  microalbuminuria 09/19/2012  . GERD (gastroesophageal reflux disease) 09/19/2012  . Leukocytosis, unspecified 09/19/2012  . Lacunar infarction ([place]) 08/17/2012  . Polymyalgia rheumatica ([place]) 08/17/2012  . 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 Unspecified essential hypertension 05/10/2012  . Unspecified vitamin D deficiency 05/10/2012  . Diabetes mellitus due to underlying condition ([place]) 05/10/2012  . Hyperlipidemia LDL goal <100 05/10/2012  . Anemia of chronic disease 05/10/2012  . Screening for prostate cancer 05/10/2012  . Chronic kidney disease (CKD), stage IV (severe) ([place]) 05/10/2012  . Peripheral autonomic neuropathy due to DM ([place]) 05/10/2012  . Callus of foot 05/10/2012  . Urgency of urination 05/10/2012  . Hyperkalemia 05/10/2012  . Candidiasis of the esophagus 10/12/2011  . Internal hemorrhoids without mention of complication 11/07/2534  . Pre-syncope 07/25/2009  . DJD (degenerative joint disease) of cervical spine 02/17/2009   Past Medical History:  Diagnosis Date  . Allergy   . Anemia, unspecified   . Anxiety   . Arthralgia 2010   polyarticular  . Arthritis    "back, knees" (05/09/2014)  . CHF (congestive heart failure) ([place])   . CHF (congestive heart failure) ([place]) 07/25/2009   denies  . Chronic lower back pain   . Coughing    pt. reports that he has drainage from sinus infection  . Diabetic neuropathy ([place])   . ESRD (end stage renal disease) on dialysis [facility])    started 12/2012; "MWF; [facility] "   . GERD (gastroesophageal reflux disease)    hx "before I lost weight", no problem 9 years  . Hemodialysis access site with mature fistula ([place])   . Hemorrhoids, internal 10/2011   small  . History of blood transfusion    "related to the anemia"  . Hypertension   . Insomnia, unspecified   . Knee pain, left   . Lacunar infarction ([place]) 2006   RUE/RLE, speech  . Long term (current) use of anticoagulants   . Myocardial infarction 1995  . Orthostatic  hypotension   . Osteomyelitis of foot, left, acute ([place])   . Other chronic postoperative pain   . Pneumonia    "probably 4-5 times" (05/09/2014)  . Polymyalgia rheumatica ([place])   . Renal insufficiency   . Sleep apnea    "lost weight; no more problem" (05/09/2014)  . Stroke ([place]) 01/10/06   denies residual on 05/09/2014  . Type II diabetes mellitus ([place]) dx'd 1995  . Ulcer ([place])    diabetic foot   . Unspecified hereditary and idiopathic peripheral neuropathy    feet  . Unspecified osteomyelitis, site unspecified   . Unspecified vitamin D deficiency     Family History  Problem Relation Age of Onset  . Hypertension Mother   . Cancer Mother 35    Ovarian  . Heart disease Maternal Aunt   . Stroke Maternal Grandfather     Past Surgical History:  Procedure Laterality Date  . AMPUTATION  01/21/2012   Procedure: AMPUTATION RAY;  Surgeon: Beverely Low  Fernanda Drum, MD;  Location: [facility];  Service: Orthopedics;  Laterality: Left;  Left Foot 4th Ray Amputation  . AMPUTATION Left 05/04/2013   Procedure: AMPUTATION DIGIT;  Surgeon: [name], MD;  Location: [facility];  Service: Orthopedics;  Laterality: Left;  Left Great Toe Amputation at [facility]  . ANTERIOR CERVICAL DECOMP/DISCECTOMY FUSION  02/2011  . BASCILIC VEIN TRANSPOSITION Left 10/19/2012   Procedure: BASCILIC VEIN TRANSPOSITION;  Surgeon: Serafina Mitchell, MD;  Location: [facility];  Service: Vascular;  Laterality: Left;  . EXCISIONAL TOTAL KNEE ARTHROPLASTY WITH ANTIBIOTIC SPACERS Left 08/07/2014   Procedure: Replace Left Total Knee Arthroplasty,  Place Antibiotic Spacer;  Surgeon: [name], MD;  Location: [facility];  Service: Orthopedics;  Laterality: Left;  . I&D EXTREMITY Left 05/09/2014   Procedure: Irrigation and Debridement Left Knee and Closure of Total Knee Arthroplasty Incision;  Surgeon: [name], MD;  Location: [facility];  Service: Orthopedics;  Laterality: Left;  . I&D KNEE WITH POLY EXCHANGE Left 05/31/2014   Procedure: IRRIGATION AND DEBRIDEMENT  LEFT KNEE, PLACE ANTIBIOTIC BEADS,  POLY EXCHANGE;  Surgeon: [name], MD;  Location: [facility];  Service: Orthopedics;  Laterality: Left;  . KNEE ARTHROSCOPY Left 08-25-2012  . REFRACTIVE SURGERY Bilateral   . TOE AMPUTATION Bilateral    "I've lost 7 toes over the last 7 years" (05/09/2014)  . TOE SURGERY Left April 2015   Big toe removed on left foot.  . TONSILLECTOMY    . TOTAL KNEE ARTHROPLASTY Left 04/10/2014   Procedure: TOTAL KNEE ARTHROPLASTY;  Surgeon: [name], MD;  Location: [facility];  Service: Orthopedics;  Laterality: Left;  . TOTAL KNEE REVISION Left 10/25/2014   Procedure: LEFT TOTAL KNEE REVISION;  Surgeon: [name], MD;  Location: [facility];  Service: Orthopedics;  Laterality: Left;  . TOTAL KNEE REVISION Left 11/26/2015   Procedure: Removal Left Total Knee Arthroplasty, Hinged Total Knee Arthroplasty;  Surgeon: [name], MD;  Location: [facility];  Service: Orthopedics;  Laterality: Left;  . UVULOPALATOPHARYNGOPLASTY, TONSILLECTOMY AND SEPTOPLASTY  ~ 1989  . WOUND DEBRIDEMENT Left 05/09/2014   Dehiscence Left Total Knee Arthroplasty Incision   Social History   Occupational History  . Not on file.   Social History Main Topics  . Smoking status: Current Every Day Smoker    Packs/day: 0.12    Years: 32.00    Types: Cigarettes  . Smokeless tobacco: Former Systems developer    Quit date: 10/06/2014     Comment: 3 -4 cig daily   . Alcohol use No  . Drug use: No  . Sexual activity: Yes

## 2016-02-17 ENCOUNTER — Ambulatory Visit (INDEPENDENT_AMBULATORY_CARE_PROVIDER_SITE_OTHER): Payer: Medicare HMO | Admitting: Orthopedic Surgery

## 2016-02-17 DIAGNOSIS — Z96652 Presence of left artificial knee joint: Secondary | ICD-10-CM

## 2016-02-17 NOTE — Progress Notes (Signed)
Office Visit Note   Patient: Matthew Kline           Date of Birth: 09-08-1960           MRN: [id number] Visit Date: 02/17/2016              Requested by: Donald Prose, MD [place], [place] [address] PCP: Lynne Logan, MD  Chief Complaint  Patient presents with  . Left Knee - Routine Post Op    HPI: The patient is 56 year old gentleman who presents today status post revision of left total knee replacement. Has physical therapy scheduled through the end of the month. States is doing well. Has had a few falls. States his knee gives out on him every now and then. Overall is doing excellent.    Assessment & Plan: Visit Diagnoses:  1. Status post revision of total replacement of left knee     Plan: Continue with physical therapy and strengthening. Feels comfortable following in the office as needed.   Follow-Up Instructions: No Follow-up on file.   Left Knee Exam   Tenderness  The patient is experiencing no tenderness.     Range of Motion  Extension: -5  Flexion: 110   Other  Erythema: absent Scars: present Swelling: moderate Effusion: no effusion present     Physical Exam  Constitutional: Appears well-developed.  Head: Normocephalic.  Eyes: EOM are normal.  Neck: Normal range of motion.  Cardiovascular: Normal rate.   Pulmonary/Chest: Effort normal.  Neurological: Is alert.  Skin: Skin is warm.  Psychiatric: Has a normal mood and affect.   Imaging: No results found.  Orders:  No orders of the defined types were placed in this encounter.  No orders of the defined types were placed in this encounter.    Procedures: No procedures performed  Clinical Data: No additional findings.  Subjective: Review of Systems  Objective: Vital Signs: There were no vitals taken for this visit.  Specialty Comments:  No specialty comments available.  PMFS History: Patient Active Problem List   Diagnosis Date Noted  . S/P  revision of total knee 11/26/2015  . Pain in the chest   . Acute on chronic diastolic heart failure ([place]) 07/05/2015  . Volume overload 07/04/2015  . Shortness of breath 07/04/2015  . Hypoxemia 07/04/2015  . Elevated troponin   . End-stage renal disease on hemodialysis ([place])   . Hypervolemia   . 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 Septic joint of left knee joint ([place]) 08/07/2014  . Tachycardia 07/24/2014  . Acute upper respiratory infection 07/24/2014  . Sepsis ([place]) 07/14/2014  . ESRD (end stage renal disease) ([place]) 07/14/2014  . Type II diabetes mellitus ([place]) 07/14/2014  . Anemia in chronic kidney disease 07/14/2014  . Infection of total knee replacement ([place]) 05/31/2014  . Surgical wound dehiscence 05/09/2014  . Dehiscence of closure of skin 05/09/2014  . Total knee replacement status 04/10/2014  . Diabetes mellitus with renal manifestations, controlled ([place]) 10/24/2013  . Hypertensive renal disease 06/27/2013  . DM type 2 causing vascular disease ([place]) 06/27/2013  . Erectile dysfunction 06/27/2013  . Depression 06/27/2013  . Claudication of left lower extremity ([place]) 12/19/2012  . Essential hypertension, benign 12/19/2012  . Sinusitis, acute maxillary 11/22/2012  . Otitis, externa, infective 11/14/2012  . Leg edema, left 11/14/2012  . End stage renal disease ([place]) 10/02/2012  . Controlled type 2 DM with proteinuria or microalbuminuria 09/19/2012  . GERD (gastroesophageal reflux  disease) 09/19/2012  . Leukocytosis, unspecified 09/19/2012  . Lacunar infarction ([place]) 08/17/2012  . Polymyalgia rheumatica ([place]) 08/17/2012  . 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 Unspecified essential hypertension 05/10/2012  . Unspecified vitamin D deficiency 05/10/2012  . Diabetes mellitus due to underlying condition ([place]) 05/10/2012  . Hyperlipidemia LDL goal <100 05/10/2012  . Anemia of chronic disease 05/10/2012  . Screening for prostate cancer 05/10/2012  . Chronic  kidney disease (CKD), stage IV (severe) ([place]) 05/10/2012  . 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 Callus of foot 05/10/2012  . Urgency of urination 05/10/2012  . Hyperkalemia 05/10/2012  . Candidiasis of the esophagus 10/12/2011  . Internal hemorrhoids without mention of complication 70/26/3785  . Pre-syncope 07/25/2009  . DJD (degenerative joint disease) of cervical spine 02/17/2009   Past Medical History:  Diagnosis Date  . Allergy   . Anemia, unspecified   . Anxiety   . Arthralgia 2010   polyarticular  . Arthritis    "back, knees" (05/09/2014)  . CHF (congestive heart failure) ([place])   . CHF (congestive heart failure) ([place]) 07/25/2009   denies  . Chronic lower back pain   . Coughing    pt. reports that he has drainage from sinus infection  . Diabetic neuropathy ([place])   . ESRD (end stage renal disease) on dialysis [facility])    started 12/2012; "MWF; [facility] "   . GERD (gastroesophageal reflux disease)    hx "before I lost weight", no problem 9 years  . Hemodialysis access site with mature fistula ([place])   . Hemorrhoids, internal 10/2011   small  . History of blood transfusion    "related to the anemia"  . Hypertension   . Insomnia, unspecified   . Knee pain, left   . Lacunar infarction ([place]) 2006   RUE/RLE, speech  . Long term (current) use of anticoagulants   . Myocardial infarction 1995  . Orthostatic hypotension   . Osteomyelitis of foot, left, acute ([place])   . Other chronic postoperative pain   . Pneumonia    "probably 4-5 times" (05/09/2014)  . Polymyalgia rheumatica ([place])   . Renal insufficiency   . Sleep apnea    "lost weight; no more problem" (05/09/2014)  . Stroke ([place]) 01/10/06   denies residual on 05/09/2014  . Type II diabetes mellitus ([place]) dx'd 1995  . Ulcer ([place])    diabetic foot   . Unspecified hereditary and idiopathic peripheral neuropathy    feet  . Unspecified osteomyelitis, site unspecified   . Unspecified vitamin  D deficiency     Family History  Problem Relation Age of Onset  . Hypertension Mother   . Cancer Mother 64    Ovarian  . Heart disease Maternal Aunt   . Stroke Maternal Grandfather     Past Surgical History:  Procedure Laterality Date  . AMPUTATION  01/21/2012   Procedure: AMPUTATION RAY;  Surgeon: [name], MD;  Location: [facility];  Service: Orthopedics;  Laterality: Left;  Left Foot 4th Ray Amputation  . AMPUTATION Left 05/04/2013   Procedure: AMPUTATION DIGIT;  Surgeon: [name], MD;  Location: [facility];  Service: Orthopedics;  Laterality: Left;  Left Great Toe Amputation at [facility]  . ANTERIOR CERVICAL DECOMP/DISCECTOMY FUSION  02/2011  . BASCILIC VEIN TRANSPOSITION Left 10/19/2012   Procedure: BASCILIC VEIN TRANSPOSITION;  Surgeon: Serafina Mitchell, MD;  Location: [facility];  Service: Vascular;  Laterality: Left;  . EXCISIONAL TOTAL KNEE ARTHROPLASTY WITH ANTIBIOTIC  SPACERS Left 08/07/2014   Procedure: Replace Left Total Knee Arthroplasty,  Place Antibiotic Spacer;  Surgeon: [name], MD;  Location: [facility];  Service: Orthopedics;  Laterality: Left;  . I&D EXTREMITY Left 05/09/2014   Procedure: Irrigation and Debridement Left Knee and Closure of Total Knee Arthroplasty Incision;  Surgeon: [name], MD;  Location: [facility];  Service: Orthopedics;  Laterality: Left;  . I&D KNEE WITH POLY EXCHANGE Left 05/31/2014   Procedure: IRRIGATION AND DEBRIDEMENT LEFT KNEE, PLACE ANTIBIOTIC BEADS,  POLY EXCHANGE;  Surgeon: [name], MD;  Location: [facility];  Service: Orthopedics;  Laterality: Left;  . KNEE ARTHROSCOPY Left 08-25-2012  . REFRACTIVE SURGERY Bilateral   . TOE AMPUTATION Bilateral    "I've lost 7 toes over the last 7 years" (05/09/2014)  . TOE SURGERY Left April 2015   Big toe removed on left foot.  . TONSILLECTOMY    . TOTAL KNEE ARTHROPLASTY Left 04/10/2014   Procedure: TOTAL KNEE ARTHROPLASTY;  Surgeon: [name], MD;  Location: [facility];  Service: Orthopedics;  Laterality: Left;    . TOTAL KNEE REVISION Left 10/25/2014   Procedure: LEFT TOTAL KNEE REVISION;  Surgeon: [name], MD;  Location: [facility];  Service: Orthopedics;  Laterality: Left;  . TOTAL KNEE REVISION Left 11/26/2015   Procedure: Removal Left Total Knee Arthroplasty, Hinged Total Knee Arthroplasty;  Surgeon: [name], MD;  Location: [facility];  Service: Orthopedics;  Laterality: Left;  . UVULOPALATOPHARYNGOPLASTY, TONSILLECTOMY AND SEPTOPLASTY  ~ 1989  . WOUND DEBRIDEMENT Left 05/09/2014   Dehiscence Left Total Knee Arthroplasty Incision   Social History   Occupational History  . Not on file.   Social History Main Topics  . Smoking status: Current Every Day Smoker    Packs/day: 0.12    Years: 32.00    Types: Cigarettes  . Smokeless tobacco: Former Systems developer    Quit date: 10/06/2014     Comment: 3 -4 cig daily   . Alcohol use No  . Drug use: No  . Sexual activity: Yes

## 2016-02-20 ENCOUNTER — Other Ambulatory Visit (INDEPENDENT_AMBULATORY_CARE_PROVIDER_SITE_OTHER): Payer: Self-pay | Admitting: Family

## 2016-03-02 ENCOUNTER — Other Ambulatory Visit: Payer: Self-pay | Admitting: Internal Medicine

## 2016-03-09 ENCOUNTER — Other Ambulatory Visit: Payer: Self-pay | Admitting: Internal Medicine

## 2016-04-30 DIAGNOSIS — R9439 Abnormal result of other cardiovascular function study: Secondary | ICD-10-CM | POA: Insufficient documentation

## 2016-04-30 DIAGNOSIS — Z01818 Encounter for other preprocedural examination: Secondary | ICD-10-CM | POA: Insufficient documentation

## 2016-05-11 DIAGNOSIS — I2510 Atherosclerotic heart disease of native coronary artery without angina pectoris: Secondary | ICD-10-CM | POA: Insufficient documentation

## 2016-05-11 DIAGNOSIS — R9439 Abnormal result of other cardiovascular function study: Secondary | ICD-10-CM | POA: Insufficient documentation

## 2016-05-18 DIAGNOSIS — I2510 Atherosclerotic heart disease of native coronary artery without angina pectoris: Secondary | ICD-10-CM | POA: Insufficient documentation

## 2016-05-26 ENCOUNTER — Telehealth (INDEPENDENT_AMBULATORY_CARE_PROVIDER_SITE_OTHER): Payer: Self-pay | Admitting: Orthopedic Surgery

## 2016-05-26 NOTE — Telephone Encounter (Signed)
I received call from [facility] @ [place] office, he received all the requested records except the 04/10/2014 op note. I faxed that to him [phone]

## 2016-06-01 ENCOUNTER — Telehealth (HOSPITAL_COMMUNITY): Payer: Self-pay

## 2016-06-01 NOTE — Telephone Encounter (Signed)
Verified [employer] insurance benefits through Passport. Copay $10, No coinsurance or Deductible Out of Pocket $5900.00, pt has met $5331.74 Reference [id number].... KJ   Faxed over Cardiac Rehab form to Dr. Aaron Edelman Kincaid's office for recent Hospital F/u... KJ

## 2016-06-17 ENCOUNTER — Telehealth (HOSPITAL_COMMUNITY): Payer: Self-pay

## 2016-06-17 NOTE — Telephone Encounter (Signed)
Cardiac Rehab Medication Review by a Pharmacist  Does the patient  feel that his/her medications are working for him/her?  yes  Has the patient been experiencing any side effects to the medications prescribed?  no  Does the patient measure his/her own blood pressure or blood glucose at home?  yes   Does the patient have any problems obtaining medications due to transportation or finances?   no  Understanding of regimen: good Understanding of indications: good Potential of compliance: good    Pharmacist comments: Reviewed Mr. Gomer medications via phone. He reports he is now taking carvedilol instead of metoprolol, addition of amlodipine, and increased atorvastatin dose to 40mg . Updated his med list to reflect these changes. He denies any side effects and monitors his blood pressure as needed. He reports good adherence using a pill box, but occasionally forgets his bedtime doses. Recommended setting an alarm to help him remember. Patient verbalized understanding.   Gwenlyn Perking, PharmD PGY1 Pharmacy Resident Pager: [phone] 06/17/2016 10:09 AM

## 2016-06-23 ENCOUNTER — Telehealth (HOSPITAL_COMMUNITY): Payer: Self-pay

## 2016-06-23 NOTE — Telephone Encounter (Signed)
*  updated insurance* [employer] - $10.00 co-pay, no deductible, out of pocket $5900/$5900 has been met, no co-insurance, no pre-authorization and no limit on visit. Passport/reference # [id number].

## 2016-06-24 ENCOUNTER — Encounter (HOSPITAL_COMMUNITY)
Admission: RE | Admit: 2016-06-24 | Discharge: 2016-06-24 | Disposition: A | Discharge status: Home / Self Care | Payer: Medicare HMO | Source: Ambulatory Visit | Attending: Cardiology | Admitting: Cardiology

## 2016-06-24 ENCOUNTER — Encounter (HOSPITAL_COMMUNITY): Payer: Self-pay

## 2016-06-24 ENCOUNTER — Ambulatory Visit: Payer: Medicare HMO | Admitting: Podiatry

## 2016-06-24 VITALS — BP 110/62 | HR 78 | Ht 69.0 in | Wt 199.7 lb

## 2016-06-24 DIAGNOSIS — Z48812 Encounter for surgical aftercare following surgery on the circulatory system: Secondary | ICD-10-CM | POA: Diagnosis present

## 2016-06-24 DIAGNOSIS — Z951 Presence of aortocoronary bypass graft: Secondary | ICD-10-CM | POA: Diagnosis present

## 2016-06-24 HISTORY — DX: Atherosclerotic heart disease of native coronary artery without angina pectoris: I25.10

## 2016-06-24 NOTE — Progress Notes (Signed)
Cardiac Individual Treatment Plan  Patient Details  Name: Matthew Kline MRN: [id number] Date of Birth: 1960/09/16 Referring Provider:    Initial Encounter Date:    CARDIAC REHAB PHASE II ORIENTATION from 06/24/2016 in [place]  Date  06/24/16      Visit Diagnosis: S/P CABG (coronary artery bypass graft)  Patient's Home Medications on Admission:  Current Outpatient Prescriptions:  .  albuterol (PROVENTIL HFA;VENTOLIN HFA) 108 (90 Base) MCG/ACT inhaler, Inhale 2 puffs into the lungs every 6 (six) hours as needed for wheezing or shortness of breath., Disp: 1 Inhaler, Rfl: 6 .  amLODipine (NORVASC) 10 MG tablet, Take 10 mg by mouth daily., Disp: , Rfl:  .  aspirin 81 MG EC tablet, TAKE 1 TABLET EVERY DAY (Patient taking differently: TAKE 2 TABLETs EVERY DAY), Disp: 30 tablet, Rfl: 6 .  atorvastatin (LIPITOR) 10 MG tablet, Take one tablet by mouth once daily at 6pm for cholesterol. **Needs an appointment before anymore refills. (Patient taking differently: 40 mg. Take one tablet by mouth once daily at 6pm for cholesterol. **Needs an appointment before anymore refills.), Disp: 30 tablet, Rfl: 0 .  calcitRIOL (ROCALTROL) 1 MCG/ML solution, Take 1 mcg by mouth every Monday, Wednesday, and Friday with hemodialysis. Calcitriol IV Injection active form of vitamin D., Disp: , Rfl:  .  carvedilol (COREG) 25 MG tablet, Take 25 mg by mouth 2 (two) times daily with a meal., Disp: , Rfl:  .  fluticasone (FLONASE) 50 MCG/ACT nasal spray, Place 2 sprays into both nostrils daily. (Patient taking differently: Place 2 sprays into both nostrils daily as needed. ), Disp: 16 g, Rfl: 6 .  gabapentin (NEURONTIN) 300 MG capsule, TAKE ONE CAPSULE BY MOUTH 3 TIMES A DAY (Patient taking differently: TAKE ONE CAPSULE BY MOUTH daily), Disp: 270 capsule, Rfl: 0 .  montelukast (SINGULAIR) 10 MG tablet, TAKE 1 TABLET (10 MG TOTAL) BY MOUTH AT BEDTIME., Disp: 90 tablet, Rfl: 1 .   multivitamin (RENA-VIT) TABS tablet, Take 1 tablet by mouth daily., Disp: , Rfl:  .  oxyCODONE-acetaminophen (PERCOCET/ROXICET) 5-325 MG tablet, Take 1 tablet by mouth every 4 (four) hours as needed for severe pain. (Patient not taking: Reported on 06/17/2016), Disp: 60 tablet, Rfl: 0 .  sevelamer carbonate (RENVELA) 800 MG tablet, Take 800-3,200 mg by mouth 3 (three) times daily with meals. 800 mg-1600 mg with snacks (depends on dietary intake) and 2400 mg-3200 mg with meals, Disp: , Rfl:  .  sorbitol 70 % solution, Take 30-60 mLs by mouth daily as needed (for constipation). , Disp: , Rfl: 5 .  temazepam (RESTORIL) 30 MG capsule, Take 30 mg by mouth at bedtime as needed. for sleep, Disp: , Rfl: 2  Past Medical History: Past Medical History:  Diagnosis Date  . Allergy   . Anemia, unspecified   . Anxiety   . Arthralgia 2010   polyarticular  . Arthritis    "back, knees" (05/09/2014)  . CHF (congestive heart failure) ([place])   . CHF (congestive heart failure) ([place]) 07/25/2009   denies  . Chronic lower back pain   . Coronary artery disease   . Coughing    pt. reports that he has drainage from sinus infection  . Diabetic neuropathy ([place])   . ESRD (end stage renal disease) on dialysis [facility])    started 12/2012; "MWF; [facility] "   . GERD (gastroesophageal reflux disease)    hx "before I lost weight", no problem 9 years  . Hemodialysis  access site with mature fistula ([place])   . Hemorrhoids, internal 10/2011   small  . History of blood transfusion    "related to the anemia"  . Hypertension   . Insomnia, unspecified   . Knee pain, left   . 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 Long term (current) use of anticoagulants   . Myocardial infarction ([place]) 1995  . Orthostatic hypotension   . Osteomyelitis of foot, left, acute ([place])   . Other chronic postoperative pain   . Pneumonia    "probably 4-5 times" (05/09/2014)  . Polymyalgia rheumatica ([place])   . Renal insufficiency    . Sleep apnea    "lost weight; no more problem" (05/09/2014)  . Stroke ([place]) 01/10/06   denies residual on 05/09/2014  . Type II diabetes mellitus ([place]) dx'd 1995  . Ulcer    diabetic foot   . Unspecified hereditary and idiopathic peripheral neuropathy    feet  . Unspecified osteomyelitis, site unspecified   . Unspecified vitamin D deficiency     Tobacco Use: History  Smoking Status  . Former Smoker  . Packs/day: 0.12  . Years: 32.00  . Types: Cigarettes  . Quit date: 05/28/2016  Smokeless Tobacco  . Former Systems developer  . Quit date: 10/06/2014    Comment: 3 -4 cig daily     Labs: Recent Review Flowsheet Data    Labs for ITP Cardiac and Pulmonary Rehab Latest Ref Rng & Units 10/25/2014 04/16/2015 07/05/2015 07/06/2015 11/24/2015   Cholestrol 0 - 200 mg/dL - - 202(H) 180 -   LDLCALC 0 - 99 mg/dL - - 114(H) 112(H) -   HDL >40 mg/dL - - 48 44 -   Trlycerides <150 mg/dL - - 202(H) 118 -   Hemoglobin A1c 4.8 - 5.6 % 6.0(H) 5.8(H) - - 6.6(H)   PHART 7.350 - 7.450 - - - - -   PCO2ART 35.0 - 45.0 mmHg - - - - -   HCO3 20.0 - 24.0 mEq/L - - - - -   TCO2 0 - 100 mmol/L - - - - -   ACIDBASEDEF 0.0 - 2.0 mmol/L - - - - -   O2SAT % - - - - -      Capillary Blood Glucose: Lab Results  Component Value Date   GLUCAP 118 (H) 12/01/2015   GLUCAP 132 (H) 11/30/2015   GLUCAP 134 (H) 11/30/2015   GLUCAP 117 (H) 11/30/2015   GLUCAP 165 (H) 11/29/2015     Exercise Target Goals: Date: 06/24/16  Exercise Program Goal: Individual exercise prescription set with THRR, safety & activity barriers. Participant demonstrates ability to understand and report RPE using BORG scale, to self-measure pulse accurately, and to acknowledge the importance of the exercise prescription.  Exercise Prescription Goal: Starting with aerobic activity 30 plus minutes a day, 3 days per week for initial exercise prescription. Provide home exercise prescription and guidelines that participant acknowledges understanding  prior to discharge.  Activity Barriers & Risk Stratification:   6 Minute Walk:     6 Minute Walk    Row Name 06/24/16 1206         6 Minute Walk   Phase Initial     Distance 800 feet     Walk Time 5.35 minutes     # of Rest Breaks 1     MPH 1.7     METS 2.2     RPE 14     VO2 Peak 7.5  Symptoms No     Resting HR 78 bpm     Resting BP 110/62     Max Ex. HR 83 bpm     Max Ex. BP 118/72     2 Minute Post BP 132/70        Oxygen Initial Assessment:   Oxygen Re-Evaluation:   Oxygen Discharge (Final Oxygen Re-Evaluation):   Initial Exercise Prescription:     Initial Exercise Prescription - 06/24/16 1200      Date of Initial Exercise RX and Referring Provider   Date 06/24/16     Recumbant Bike   Level 1.5   Minutes 10   METs 2     NuStep   Level 2   SPM 80   Minutes 10   METs 2     Arm Ergometer   Level 1   Watts 25   Minutes 10   METs 2     Prescription Details   Frequency (times per week) 3   Duration Progress to 45 minutes of aerobic exercise without signs/symptoms of physical distress     Intensity   THRR 40-80% of Max Heartrate 65-131   Ratings of Perceived Exertion 11-13   Perceived Dyspnea 0-4     Progression   Progression Continue to progress workloads to maintain intensity without signs/symptoms of physical distress.     Resistance Training   Training Prescription Yes   Weight 2   Reps 10-15      Perform Capillary Blood Glucose checks as needed.  Exercise Prescription Changes:   Exercise Comments:   Exercise Goals and Review:     Exercise Goals    Row Name 06/24/16 0851             Exercise Goals   Increase Physical Activity Yes       Intervention Develop an individualized exercise prescription for aerobic and resistive training based on initial evaluation findings, risk stratification, comorbidities and participant's personal goals.;Provide advice, education, support and counseling about physical  activity/exercise needs.       Expected Outcomes Achievement of increased cardiorespiratory fitness and enhanced flexibility, muscular endurance and strength shown through measurements of functional capacity and personal statement of participant.       Increase Strength and Stamina Yes       Intervention Provide advice, education, support and counseling about physical activity/exercise needs.;Develop an individualized exercise prescription for aerobic and resistive training based on initial evaluation findings, risk stratification, comorbidities and participant's personal goals.       Expected Outcomes Achievement of increased cardiorespiratory fitness and enhanced flexibility, muscular endurance and strength shown through measurements of functional capacity and personal statement of participant.          Exercise Goals Re-Evaluation :    Discharge Exercise Prescription (Final Exercise Prescription Changes):   Nutrition:  Target Goals: Understanding of nutrition guidelines, daily intake of sodium 1500mg , cholesterol 200mg , calories 30% from fat and 7% or less from saturated fats, daily to have 5 or more servings of fruits and vegetables.  Biometrics:     Pre Biometrics - 06/24/16 1209      Pre Biometrics   Waist Circumference 40 inches   Hip Circumference 40.25 inches   Waist to Hip Ratio 0.99 %   Triceps Skinfold 13 mm   % Body Fat 27.3 %   Grip Strength 34 kg   Flexibility 10 in   Single Leg Stand 1.69 seconds       Nutrition Therapy Plan and Nutrition  Goals:   Nutrition Discharge: Nutrition Scores:   Nutrition Goals Re-Evaluation:   Nutrition Goals Re-Evaluation:   Nutrition Goals Discharge (Final Nutrition Goals Re-Evaluation):   Psychosocial: Target Goals: Acknowledge presence or absence of significant depression and/or stress, maximize coping skills, provide positive support system. Participant is able to verbalize types and ability to use techniques and  skills needed for reducing stress and depression.  Initial Review & Psychosocial Screening:     Initial Psych Review & Screening - 06/24/16 1307      Initial Review   Current issues with Current Stress Concerns   Source of Stress Concerns Chronic Illness   Comments ESRD, wait list for kidney transplant - 4 yrs     Family Dynamics   Good Support System? Yes     Barriers   Psychosocial barriers to participate in program The patient should benefit from training in stress management and relaxation.      Quality of Life Scores:     Quality of Life - 06/24/16 1210      Quality of Life Scores   Health/Function Pre 23.14 %   Socioeconomic Pre 26.57 %   Psych/Spiritual Pre 25.71 %   Family Pre 21.6 %   GLOBAL Pre 24.18 %      PHQ-9: Recent Review Flowsheet Data    Depression screen [facility] 2/9 07/24/2014 09/19/2012 05/10/2012   Decreased Interest 0 0 0   Down, Depressed, Hopeless 0 0 0   PHQ - 2 Score 0 0 0     Interpretation of Total Score  Total Score Depression Severity:  1-4 = Minimal depression, 5-9 = Mild depression, 10-14 = Moderate depression, 15-19 = Moderately severe depression, 20-27 = Severe depression   Psychosocial Evaluation and Intervention:   Psychosocial Re-Evaluation:   Psychosocial Discharge (Final Psychosocial Re-Evaluation):   Vocational Rehabilitation: Provide vocational rehab assistance to qualifying candidates.   Vocational Rehab Evaluation & Intervention:     Vocational Rehab - 06/24/16 1308      Initial Vocational Rehab Evaluation & Intervention   Assessment shows need for Vocational Rehabilitation No      Education: Education Goals: Education classes will be provided on a weekly basis, covering required topics. Participant will state understanding/return demonstration of topics presented.  Learning Barriers/Preferences:     Learning Barriers/Preferences - 06/24/16 0850      Learning Barriers/Preferences   Learning Preferences  Skilled Demonstration;Verbal Instruction;Audio      Education Topics: Count Your Pulse:  -Group instruction provided by verbal instruction, demonstration, patient participation and written materials to support subject.  Instructors address importance of being able to find your pulse and how to count your pulse when at home without a heart monitor.  Patients get hands on experience counting their pulse with staff help and individually.   Heart Attack, Angina, and Risk Factor Modification:  -Group instruction provided by verbal instruction, video, and written materials to support subject.  Instructors address signs and symptoms of angina and heart attacks.    Also discuss risk factors for heart disease and how to make changes to improve heart health risk factors.   Functional Fitness:  -Group instruction provided by verbal instruction, demonstration, patient participation, and written materials to support subject.  Instructors address safety measures for doing things around the house.  Discuss how to get up and down off the floor, how to pick things up properly, how to safely get out of a chair without assistance, and balance training.   Meditation and Mindfulness:  -Group instruction  provided by verbal instruction, patient participation, and written materials to support subject.  Instructor addresses importance of mindfulness and meditation practice to help reduce stress and improve awareness.  Instructor also leads participants through a meditation exercise.    Stretching for Flexibility and Mobility:  -Group instruction provided by verbal instruction, patient participation, and written materials to support subject.  Instructors lead participants through series of stretches that are designed to increase flexibility thus improving mobility.  These stretches are additional exercise for major muscle groups that are typically performed during regular warm up and cool down.   Hands Only CPR:   -Group verbal, video, and participation provides a basic overview of AHA guidelines for community CPR. Role-play of emergencies allow participants the opportunity to practice calling for help and chest compression technique with discussion of AED use.   Hypertension: -Group verbal and written instruction that provides a basic overview of hypertension including the most recent diagnostic guidelines, risk factor reduction with self-care instructions and medication management.    Nutrition I class: Heart Healthy Eating:  -Group instruction provided by PowerPoint slides, verbal discussion, and written materials to support subject matter. The instructor gives an explanation and review of the Therapeutic Lifestyle Changes diet recommendations, which includes a discussion on lipid goals, dietary fat, sodium, fiber, plant stanol/sterol esters, sugar, and the components of a well-balanced, healthy diet.   Nutrition II class: Lifestyle Skills:  -Group instruction provided by PowerPoint slides, verbal discussion, and written materials to support subject matter. The instructor gives an explanation and review of label reading, grocery shopping for heart health, heart healthy recipe modifications, and ways to make healthier choices when eating out.   Diabetes Question & Answer:  -Group instruction provided by PowerPoint slides, verbal discussion, and written materials to support subject matter. The instructor gives an explanation and review of diabetes co-morbidities, pre- and post-prandial blood glucose goals, pre-exercise blood glucose goals, signs, symptoms, and treatment of hypoglycemia and hyperglycemia, and foot care basics.   Diabetes Blitz:  -Group instruction provided by PowerPoint slides, verbal discussion, and written materials to support subject matter. The instructor gives an explanation and review of the physiology behind type 1 and type 2 diabetes, diabetes medications and rational behind  using different medications, pre- and post-prandial blood glucose recommendations and Hemoglobin A1c goals, diabetes diet, and exercise including blood glucose guidelines for exercising safely.    Portion Distortion:  -Group instruction provided by PowerPoint slides, verbal discussion, written materials, and food models to support subject matter. The instructor gives an explanation of serving size versus portion size, changes in portions sizes over the last 20 years, and what consists of a serving from each food group.   Stress Management:  -Group instruction provided by verbal instruction, video, and written materials to support subject matter.  Instructors review role of stress in heart disease and how to cope with stress positively.     Exercising on Your Own:  -Group instruction provided by verbal instruction, power point, and written materials to support subject.  Instructors discuss benefits of exercise, components of exercise, frequency and intensity of exercise, and end points for exercise.  Also discuss use of nitroglycerin and activating EMS.  Review options of places to exercise outside of rehab.  Review guidelines for sex with heart disease.   Cardiac Drugs I:  -Group instruction provided by verbal instruction and written materials to support subject.  Instructor reviews cardiac drug classes: antiplatelets, anticoagulants, beta blockers, and statins.  Instructor discusses reasons, side effects, and lifestyle  considerations for each drug class.   Cardiac Drugs II:  -Group instruction provided by verbal instruction and written materials to support subject.  Instructor reviews cardiac drug classes: angiotensin converting enzyme inhibitors (ACE-I), angiotensin II receptor blockers (ARBs), nitrates, and calcium channel blockers.  Instructor discusses reasons, side effects, and lifestyle considerations for each drug class.   Anatomy and Physiology of the Circulatory System:  Group  verbal and written instruction and models provide basic cardiac anatomy and physiology, with the coronary electrical and arterial systems. Review of: AMI, Angina, Valve disease, Heart Failure, Peripheral Artery Disease, Cardiac Arrhythmia, Pacemakers, and the ICD.   Other Education:  -Group or individual verbal, written, or video instructions that support the educational goals of the cardiac rehab program.   Knowledge Questionnaire Score:     Knowledge Questionnaire Score - 06/24/16 1205      Knowledge Questionnaire Score   Pre Score 14/24      Core Components/Risk Factors/Patient Goals at Admission:     Personal Goals and Risk Factors at Admission - 06/24/16 1305      Core Components/Risk Factors/Patient Goals on Admission   Diabetes Yes   Expected Outcomes --  maintain diet controlled diabetes   Lipids Yes   Stress Yes      Core Components/Risk Factors/Patient Goals Review:    Core Components/Risk Factors/Patient Goals at Discharge (Final Review):    ITP Comments:     ITP Comments    Row Name 06/24/16 0851           ITP Comments Fransico Him MD          Comments:  Patient attended orientation from 0830 to 0945to review rules and guidelines for program. Completed 6 minute walk test, Pt needed the assistance of rolling wheelchair for stability. Pt noted by the rehab staff left leg to buckle.  Rehab staff walked with pt for any needed assistance. Pt with multiple surgeries to his left knee including knee replacement by Dr. Sharol Given.  Pt stopped the test at 5:21 due to hip fatigue and discomfort.  Intitial ITP, and exercise prescription.  VSS. Telemetry-SR.  Asymptomatic. Pt has dialysis at the [facility] dialysis center MWF Because of pt dialysis schedule and how pt feels afterward, will plan to participate in rehab on Wednesdays and Fridays. Pt is in agreement of this and will gauge how he feels and whether he feels up to exercise. Encouraged pt to use wheelchair  escort to rehab department for conservation of energy. Brief psychosocial assessment reveals stress related concerns regarding chronic illness with ESRD. Pt feels supported at home and and seeks out opportunities to be "normal".  Pt surrounds himself with positive and forward thinking people.  Pt is looking forward to participating in cardiac rehab. Cherre Huger, BSN Cardiac and Pulmonary Rehab Nurse Navigator  '

## 2016-06-28 ENCOUNTER — Ambulatory Visit (HOSPITAL_COMMUNITY): Payer: Self-pay

## 2016-06-30 ENCOUNTER — Ambulatory Visit (HOSPITAL_COMMUNITY): Payer: Self-pay

## 2016-06-30 NOTE — Progress Notes (Signed)
Cardiac Individual Treatment Plan  Patient Details  Name: Matthew Kline MRN: [id number] Date of Birth: August 02, 1960 Referring Provider:    Initial Encounter Date:    CARDIAC REHAB PHASE II ORIENTATION from 06/24/2016 in [place]  Date  06/24/16      Visit Diagnosis: S/P CABG (coronary artery bypass graft)  Patient's Home Medications on Admission:  Current Outpatient Prescriptions:  .  albuterol (PROVENTIL HFA;VENTOLIN HFA) 108 (90 Base) MCG/ACT inhaler, Inhale 2 puffs into the lungs every 6 (six) hours as needed for wheezing or shortness of breath., Disp: 1 Inhaler, Rfl: 6 .  amLODipine (NORVASC) 10 MG tablet, Take 10 mg by mouth daily., Disp: , Rfl:  .  aspirin 81 MG EC tablet, TAKE 1 TABLET EVERY DAY (Patient taking differently: TAKE 2 TABLETs EVERY DAY), Disp: 30 tablet, Rfl: 6 .  atorvastatin (LIPITOR) 10 MG tablet, Take one tablet by mouth once daily at 6pm for cholesterol. **Needs an appointment before anymore refills. (Patient taking differently: 40 mg. Take one tablet by mouth once daily at 6pm for cholesterol. **Needs an appointment before anymore refills.), Disp: 30 tablet, Rfl: 0 .  calcitRIOL (ROCALTROL) 1 MCG/ML solution, Take 1 mcg by mouth every Monday, Wednesday, and Friday with hemodialysis. Calcitriol IV Injection active form of vitamin D., Disp: , Rfl:  .  carvedilol (COREG) 25 MG tablet, Take 25 mg by mouth 2 (two) times daily with a meal., Disp: , Rfl:  .  fluticasone (FLONASE) 50 MCG/ACT nasal spray, Place 2 sprays into both nostrils daily. (Patient taking differently: Place 2 sprays into both nostrils daily as needed. ), Disp: 16 g, Rfl: 6 .  gabapentin (NEURONTIN) 300 MG capsule, TAKE ONE CAPSULE BY MOUTH 3 TIMES A DAY (Patient taking differently: TAKE ONE CAPSULE BY MOUTH daily), Disp: 270 capsule, Rfl: 0 .  montelukast (SINGULAIR) 10 MG tablet, TAKE 1 TABLET (10 MG TOTAL) BY MOUTH AT BEDTIME., Disp: 90 tablet, Rfl: 1 .   multivitamin (RENA-VIT) TABS tablet, Take 1 tablet by mouth daily., Disp: , Rfl:  .  oxyCODONE-acetaminophen (PERCOCET/ROXICET) 5-325 MG tablet, Take 1 tablet by mouth every 4 (four) hours as needed for severe pain. (Patient not taking: Reported on 06/17/2016), Disp: 60 tablet, Rfl: 0 .  sevelamer carbonate (RENVELA) 800 MG tablet, Take 800-3,200 mg by mouth 3 (three) times daily with meals. 800 mg-1600 mg with snacks (depends on dietary intake) and 2400 mg-3200 mg with meals, Disp: , Rfl:  .  sorbitol 70 % solution, Take 30-60 mLs by mouth daily as needed (for constipation). , Disp: , Rfl: 5 .  temazepam (RESTORIL) 30 MG capsule, Take 30 mg by mouth at bedtime as needed. for sleep, Disp: , Rfl: 2  Past Medical History: Past Medical History:  Diagnosis Date  . Allergy   . Anemia, unspecified   . Anxiety   . Arthralgia 2010   polyarticular  . Arthritis    "back, knees" (05/09/2014)  . CHF (congestive heart failure) ([place])   . CHF (congestive heart failure) ([place]) 07/25/2009   denies  . Chronic lower back pain   . Coronary artery disease   . Coughing    pt. reports that he has drainage from sinus infection  . Diabetic neuropathy ([place])   . ESRD (end stage renal disease) on dialysis [facility])    started 12/2012; "MWF; [facility] "   . GERD (gastroesophageal reflux disease)    hx "before I lost weight", no problem 9 years  . Hemodialysis  access site with mature fistula ([place])   . Hemorrhoids, internal 10/2011   small  . History of blood transfusion    "related to the anemia"  . Hypertension   . Insomnia, unspecified   . Knee pain, left   . 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 Long term (current) use of anticoagulants   . Myocardial infarction ([place]) 1995  . Orthostatic hypotension   . Osteomyelitis of foot, left, acute ([place])   . Other chronic postoperative pain   . Pneumonia    "probably 4-5 times" (05/09/2014)  . Polymyalgia rheumatica ([place])   . Renal insufficiency    . Sleep apnea    "lost weight; no more problem" (05/09/2014)  . Stroke ([place]) 01/10/06   denies residual on 05/09/2014  . Type II diabetes mellitus ([place]) dx'd 1995  . Ulcer    diabetic foot   . Unspecified hereditary and idiopathic peripheral neuropathy    feet  . Unspecified osteomyelitis, site unspecified   . Unspecified vitamin D deficiency     Tobacco Use: History  Smoking Status  . Former Smoker  . Packs/day: 0.12  . Years: 32.00  . Types: Cigarettes  . Quit date: 05/28/2016  Smokeless Tobacco  . Former Systems developer  . Quit date: 10/06/2014    Comment: 3 -4 cig daily     Labs: Recent Review Flowsheet Data    Labs for ITP Cardiac and Pulmonary Rehab Latest Ref Rng & Units 10/25/2014 04/16/2015 07/05/2015 07/06/2015 11/24/2015   Cholestrol 0 - 200 mg/dL - - 202(H) 180 -   LDLCALC 0 - 99 mg/dL - - 114(H) 112(H) -   HDL >40 mg/dL - - 48 44 -   Trlycerides <150 mg/dL - - 202(H) 118 -   Hemoglobin A1c 4.8 - 5.6 % 6.0(H) 5.8(H) - - 6.6(H)   PHART 7.350 - 7.450 - - - - -   PCO2ART 35.0 - 45.0 mmHg - - - - -   HCO3 20.0 - 24.0 mEq/L - - - - -   TCO2 0 - 100 mmol/L - - - - -   ACIDBASEDEF 0.0 - 2.0 mmol/L - - - - -   O2SAT % - - - - -      Capillary Blood Glucose: Lab Results  Component Value Date   GLUCAP 118 (H) 12/01/2015   GLUCAP 132 (H) 11/30/2015   GLUCAP 134 (H) 11/30/2015   GLUCAP 117 (H) 11/30/2015   GLUCAP 165 (H) 11/29/2015     Exercise Target Goals:    Exercise Program Goal: Individual exercise prescription set with THRR, safety & activity barriers. Participant demonstrates ability to understand and report RPE using BORG scale, to self-measure pulse accurately, and to acknowledge the importance of the exercise prescription.  Exercise Prescription Goal: Starting with aerobic activity 30 plus minutes a day, 3 days per week for initial exercise prescription. Provide home exercise prescription and guidelines that participant acknowledges understanding prior to  discharge.  Activity Barriers & Risk Stratification:   6 Minute Walk:     6 Minute Walk    Row Name 06/24/16 1206         6 Minute Walk   Phase Initial     Distance 800 feet     Walk Time 5.35 minutes     # of Rest Breaks 1     MPH 1.7     METS 2.2     RPE 14     VO2 Peak 7.5  Symptoms No     Resting HR 78 bpm     Resting BP 110/62     Max Ex. HR 83 bpm     Max Ex. BP 118/72     2 Minute Post BP 132/70        Oxygen Initial Assessment:   Oxygen Re-Evaluation:   Oxygen Discharge (Final Oxygen Re-Evaluation):   Initial Exercise Prescription:     Initial Exercise Prescription - 06/24/16 1200      Date of Initial Exercise RX and Referring Provider   Date 06/24/16     Recumbant Bike   Level 1.5   Minutes 10   METs 2     NuStep   Level 2   SPM 80   Minutes 10   METs 2     Arm Ergometer   Level 1   Watts 25   Minutes 10   METs 2     Prescription Details   Frequency (times per week) 3   Duration Progress to 45 minutes of aerobic exercise without signs/symptoms of physical distress     Intensity   THRR 40-80% of Max Heartrate 65-131   Ratings of Perceived Exertion 11-13   Perceived Dyspnea 0-4     Progression   Progression Continue to progress workloads to maintain intensity without signs/symptoms of physical distress.     Resistance Training   Training Prescription Yes   Weight 2   Reps 10-15      Perform Capillary Blood Glucose checks as needed.  Exercise Prescription Changes:   Exercise Comments:   Exercise Goals and Review:     Exercise Goals    Row Name 06/24/16 0851             Exercise Goals   Increase Physical Activity Yes       Intervention Develop an individualized exercise prescription for aerobic and resistive training based on initial evaluation findings, risk stratification, comorbidities and participant's personal goals.;Provide advice, education, support and counseling about physical activity/exercise  needs.       Expected Outcomes Achievement of increased cardiorespiratory fitness and enhanced flexibility, muscular endurance and strength shown through measurements of functional capacity and personal statement of participant.       Increase Strength and Stamina Yes       Intervention Provide advice, education, support and counseling about physical activity/exercise needs.;Develop an individualized exercise prescription for aerobic and resistive training based on initial evaluation findings, risk stratification, comorbidities and participant's personal goals.       Expected Outcomes Achievement of increased cardiorespiratory fitness and enhanced flexibility, muscular endurance and strength shown through measurements of functional capacity and personal statement of participant.          Exercise Goals Re-Evaluation :    Discharge Exercise Prescription (Final Exercise Prescription Changes):   Nutrition:  Target Goals: Understanding of nutrition guidelines, daily intake of sodium 1500mg , cholesterol 200mg , calories 30% from fat and 7% or less from saturated fats, daily to have 5 or more servings of fruits and vegetables.  Biometrics:     Pre Biometrics - 06/24/16 1209      Pre Biometrics   Waist Circumference 40 inches   Hip Circumference 40.25 inches   Waist to Hip Ratio 0.99 %   Triceps Skinfold 13 mm   % Body Fat 27.3 %   Grip Strength 34 kg   Flexibility 10 in   Single Leg Stand 1.69 seconds       Nutrition Therapy Plan and Nutrition  Goals:   Nutrition Discharge: Nutrition Scores:   Nutrition Goals Re-Evaluation:   Nutrition Goals Re-Evaluation:   Nutrition Goals Discharge (Final Nutrition Goals Re-Evaluation):   Psychosocial: Target Goals: Acknowledge presence or absence of significant depression and/or stress, maximize coping skills, provide positive support system. Participant is able to verbalize types and ability to use techniques and skills needed for  reducing stress and depression.  Initial Review & Psychosocial Screening:     Initial Psych Review & Screening - 06/24/16 1307      Initial Review   Current issues with Current Stress Concerns   Source of Stress Concerns Chronic Illness   Comments ESRD, wait list for kidney transplant - 4 yrs     Family Dynamics   Good Support System? Yes     Barriers   Psychosocial barriers to participate in program The patient should benefit from training in stress management and relaxation.      Quality of Life Scores:     Quality of Life - 06/24/16 1210      Quality of Life Scores   Health/Function Pre 23.14 %   Socioeconomic Pre 26.57 %   Psych/Spiritual Pre 25.71 %   Family Pre 21.6 %   GLOBAL Pre 24.18 %      PHQ-9: Recent Review Flowsheet Data    Depression screen [facility] 2/9 07/24/2014 09/19/2012 05/10/2012   Decreased Interest 0 0 0   Down, Depressed, Hopeless 0 0 0   PHQ - 2 Score 0 0 0     Interpretation of Total Score  Total Score Depression Severity:  1-4 = Minimal depression, 5-9 = Mild depression, 10-14 = Moderate depression, 15-19 = Moderately severe depression, 20-27 = Severe depression   Psychosocial Evaluation and Intervention:   Psychosocial Re-Evaluation:   Psychosocial Discharge (Final Psychosocial Re-Evaluation):   Vocational Rehabilitation: Provide vocational rehab assistance to qualifying candidates.   Vocational Rehab Evaluation & Intervention:     Vocational Rehab - 06/24/16 1308      Initial Vocational Rehab Evaluation & Intervention   Assessment shows need for Vocational Rehabilitation No      Education: Education Goals: Education classes will be provided on a weekly basis, covering required topics. Participant will state understanding/return demonstration of topics presented.  Learning Barriers/Preferences:     Learning Barriers/Preferences - 06/24/16 0850      Learning Barriers/Preferences   Learning Preferences Skilled  Demonstration;Verbal Instruction;Audio      Education Topics: Count Your Pulse:  -Group instruction provided by verbal instruction, demonstration, patient participation and written materials to support subject.  Instructors address importance of being able to find your pulse and how to count your pulse when at home without a heart monitor.  Patients get hands on experience counting their pulse with staff help and individually.   Heart Attack, Angina, and Risk Factor Modification:  -Group instruction provided by verbal instruction, video, and written materials to support subject.  Instructors address signs and symptoms of angina and heart attacks.    Also discuss risk factors for heart disease and how to make changes to improve heart health risk factors.   Functional Fitness:  -Group instruction provided by verbal instruction, demonstration, patient participation, and written materials to support subject.  Instructors address safety measures for doing things around the house.  Discuss how to get up and down off the floor, how to pick things up properly, how to safely get out of a chair without assistance, and balance training.   Meditation and Mindfulness:  -Group instruction  provided by verbal instruction, patient participation, and written materials to support subject.  Instructor addresses importance of mindfulness and meditation practice to help reduce stress and improve awareness.  Instructor also leads participants through a meditation exercise.    Stretching for Flexibility and Mobility:  -Group instruction provided by verbal instruction, patient participation, and written materials to support subject.  Instructors lead participants through series of stretches that are designed to increase flexibility thus improving mobility.  These stretches are additional exercise for major muscle groups that are typically performed during regular warm up and cool down.   Hands Only CPR:  -Group  verbal, video, and participation provides a basic overview of AHA guidelines for community CPR. Role-play of emergencies allow participants the opportunity to practice calling for help and chest compression technique with discussion of AED use.   Hypertension: -Group verbal and written instruction that provides a basic overview of hypertension including the most recent diagnostic guidelines, risk factor reduction with self-care instructions and medication management.    Nutrition I class: Heart Healthy Eating:  -Group instruction provided by PowerPoint slides, verbal discussion, and written materials to support subject matter. The instructor gives an explanation and review of the Therapeutic Lifestyle Changes diet recommendations, which includes a discussion on lipid goals, dietary fat, sodium, fiber, plant stanol/sterol esters, sugar, and the components of a well-balanced, healthy diet.   Nutrition II class: Lifestyle Skills:  -Group instruction provided by PowerPoint slides, verbal discussion, and written materials to support subject matter. The instructor gives an explanation and review of label reading, grocery shopping for heart health, heart healthy recipe modifications, and ways to make healthier choices when eating out.   Diabetes Question & Answer:  -Group instruction provided by PowerPoint slides, verbal discussion, and written materials to support subject matter. The instructor gives an explanation and review of diabetes co-morbidities, pre- and post-prandial blood glucose goals, pre-exercise blood glucose goals, signs, symptoms, and treatment of hypoglycemia and hyperglycemia, and foot care basics.   Diabetes Blitz:  -Group instruction provided by PowerPoint slides, verbal discussion, and written materials to support subject matter. The instructor gives an explanation and review of the physiology behind type 1 and type 2 diabetes, diabetes medications and rational behind using  different medications, pre- and post-prandial blood glucose recommendations and Hemoglobin A1c goals, diabetes diet, and exercise including blood glucose guidelines for exercising safely.    Portion Distortion:  -Group instruction provided by PowerPoint slides, verbal discussion, written materials, and food models to support subject matter. The instructor gives an explanation of serving size versus portion size, changes in portions sizes over the last 20 years, and what consists of a serving from each food group.   Stress Management:  -Group instruction provided by verbal instruction, video, and written materials to support subject matter.  Instructors review role of stress in heart disease and how to cope with stress positively.     Exercising on Your Own:  -Group instruction provided by verbal instruction, power point, and written materials to support subject.  Instructors discuss benefits of exercise, components of exercise, frequency and intensity of exercise, and end points for exercise.  Also discuss use of nitroglycerin and activating EMS.  Review options of places to exercise outside of rehab.  Review guidelines for sex with heart disease.   Cardiac Drugs I:  -Group instruction provided by verbal instruction and written materials to support subject.  Instructor reviews cardiac drug classes: antiplatelets, anticoagulants, beta blockers, and statins.  Instructor discusses reasons, side effects, and lifestyle  considerations for each drug class.   Cardiac Drugs II:  -Group instruction provided by verbal instruction and written materials to support subject.  Instructor reviews cardiac drug classes: angiotensin converting enzyme inhibitors (ACE-I), angiotensin II receptor blockers (ARBs), nitrates, and calcium channel blockers.  Instructor discusses reasons, side effects, and lifestyle considerations for each drug class.   Anatomy and Physiology of the Circulatory System:  Group verbal and  written instruction and models provide basic cardiac anatomy and physiology, with the coronary electrical and arterial systems. Review of: AMI, Angina, Valve disease, Heart Failure, Peripheral Artery Disease, Cardiac Arrhythmia, Pacemakers, and the ICD.   Other Education:  -Group or individual verbal, written, or video instructions that support the educational goals of the cardiac rehab program.   Knowledge Questionnaire Score:     Knowledge Questionnaire Score - 06/24/16 1205      Knowledge Questionnaire Score   Pre Score 14/24      Core Components/Risk Factors/Patient Goals at Admission:     Personal Goals and Risk Factors at Admission - 06/24/16 1305      Core Components/Risk Factors/Patient Goals on Admission   Diabetes Yes   Expected Outcomes --  maintain diet controlled diabetes   Lipids Yes   Stress Yes      Core Components/Risk Factors/Patient Goals Review:    Core Components/Risk Factors/Patient Goals at Discharge (Final Review):    ITP Comments:     ITP Comments    Row Name 06/24/16 0851           ITP Comments Fransico Him MD          Comments: Pt scheduled to begin group exercise sessions Wed and Fridays beginning 07/05/2016.  Pt is unsure if he will be able to tolerate this scheduled due to hemodialysis M, W, F am.   Recommend participation as tolerated.

## 2016-07-02 ENCOUNTER — Inpatient Hospital Stay (HOSPITAL_COMMUNITY)
Admission: RE | Admit: 2016-07-02 | Discharge: 2016-07-02 | Disposition: A | Discharge status: Home / Self Care | Payer: Self-pay | Source: Ambulatory Visit

## 2016-07-02 DIAGNOSIS — Z951 Presence of aortocoronary bypass graft: Secondary | ICD-10-CM

## 2016-07-02 NOTE — Progress Notes (Signed)
Matthew Kline 56 y.o. male       Nutrition Screen & Note  1. S/P CABG (coronary artery bypass graft)    Past Medical History:  Diagnosis Date  . Allergy   . Anemia, unspecified   . Anxiety   . Arthralgia 2010   polyarticular  . Arthritis    "back, knees" (05/09/2014)  . CHF (congestive heart failure) (Matthew Kline)   . CHF (congestive heart failure) ([place]) 07/25/2009   denies  . Chronic lower back pain   . Coronary artery disease   . Coughing    pt. reports that he has drainage from sinus infection  . Diabetic neuropathy (Matthew Kline)   . ESRD (end stage renal disease) on dialysis Matthew Kline)    started 12/2012; "MWF; [facility] "   . GERD (gastroesophageal reflux disease)    hx "before I lost weight", no problem 9 years  . Hemodialysis access site with mature fistula (Matthew Kline)   . Hemorrhoids, internal 10/2011   small  . History of blood transfusion    "related to the anemia"  . Hypertension   . Insomnia, unspecified   . Knee pain, left   . Lacunar infarction (Matthew Kline) 2006   RUE/RLE, speech  . Long term (current) use of anticoagulants   . Myocardial infarction (Matthew Kline) 1995  . Orthostatic hypotension   . Osteomyelitis of foot, left, acute (Matthew Kline)   . Other chronic postoperative pain   . Pneumonia    "probably 4-5 times" (05/09/2014)  . Polymyalgia rheumatica (Matthew Kline)   . Renal insufficiency   . Sleep apnea    "lost weight; no more problem" (05/09/2014)  . Stroke (Matthew Kline) 01/10/06   denies residual on 05/09/2014  . Type II diabetes mellitus (Matthew Kline) dx'd 1995  . Ulcer    diabetic foot   . Unspecified hereditary and idiopathic peripheral neuropathy    feet  . Unspecified osteomyelitis, site unspecified   . Unspecified vitamin D deficiency    Meds reviewed.  HT: Ht Readings from Last 1 Encounters:  06/24/16 5\' 9"  (1.753 m)    WT: Wt Readings from Last 3 Encounters:  06/24/16 199 lb 11.8 oz (90.6 kg)  01/15/16 196 lb (88.9 kg)  12/16/15 196 lb (88.9 kg)     BMI 29.6   Current tobacco  use? No  Labs:  Lipid Panel     Component Value Date/Time   CHOL 180 07/06/2015 0124   CHOL 228 (H) 09/15/2012 1030   TRIG 118 07/06/2015 0124   HDL 44 07/06/2015 0124   HDL 40 09/15/2012 1030   CHOLHDL 4.1 07/06/2015 0124   VLDL 24 07/06/2015 0124   LDLCALC 112 (H) 07/06/2015 0124   LDLCALC 113 (H) 09/15/2012 1030    Lab Results  Component Value Date   HGBA1C 6.6 (H) 11/24/2015   CBG (last 3)  No results for input(s): GLUCAP in the last 72 hours.  Nutrition Diagnosis ? Food-and nutrition-related knowledge deficit related to lack of exposure to information as related to diagnosis of: ? CVD ? DM ? Overweight related to excessive energy intake as evidenced by a BMI of 29.6  Nutrition Goal(s):  ? Pt to identify food quantities necessary to achieve weight loss of 6-24 lb (2.7-10.9 kg) at graduation from cardiac rehab.  Plan:  Pt to attend nutrition classes ? Nutrition I ? Nutrition II ? Portion Distortion ? Diabetes Blitz ? Diabetes Q & A Will provide client-centered nutrition education as part of interdisciplinary care.   Monitor and evaluate progress toward  nutrition goal with team.  Derek Mound, M.Ed, RD, LDN, CDE 07/02/2016 11:47 AM

## 2016-07-05 ENCOUNTER — Ambulatory Visit (HOSPITAL_COMMUNITY): Payer: Self-pay

## 2016-07-07 ENCOUNTER — Telehealth (HOSPITAL_COMMUNITY): Payer: Self-pay | Admitting: Family Medicine

## 2016-07-07 ENCOUNTER — Ambulatory Visit (INDEPENDENT_AMBULATORY_CARE_PROVIDER_SITE_OTHER): Payer: Medicare HMO | Admitting: Podiatry

## 2016-07-07 ENCOUNTER — Telehealth (HOSPITAL_COMMUNITY): Payer: Self-pay | Admitting: Cardiac Rehabilitation

## 2016-07-07 ENCOUNTER — Ambulatory Visit (HOSPITAL_COMMUNITY): Payer: Self-pay

## 2016-07-07 ENCOUNTER — Encounter: Payer: Self-pay | Admitting: Podiatry

## 2016-07-07 VITALS — BP 155/93 | HR 81

## 2016-07-07 DIAGNOSIS — L97421 Non-pressure chronic ulcer of left heel and midfoot limited to breakdown of skin: Secondary | ICD-10-CM | POA: Diagnosis not present

## 2016-07-07 DIAGNOSIS — E11621 Type 2 diabetes mellitus with foot ulcer: Secondary | ICD-10-CM

## 2016-07-07 NOTE — Progress Notes (Signed)
   Subjective:    [name], male    DOB: 1960-11-02, 56 y.o.   MRN: [id number]  HPI this patient presents the office stating that he has an open circular ulcer on the bottom of his left foot. He says this ulcer has been present now for 5 weeks. He says he was initially in the hospital for heart surgery and during his rehabilitation. He was told to walk on his left foot.  He said he did as the therapist requested but then noted that the skin on the bottom of his left foot peeled off. He says that this area initially got better and the ulcer became smaller but it has re-enlarged at this time.  This patient is a diabetic on dialysis. He says he has provided no self treatment except for bandaging the ulcer.  He presents the office today for an evaluation and treatment of this diabetic ulcer on the bottom of his left foot    Review of Systems  All other systems reviewed and are negative.      Objective:   Physical Exam GENERAL APPEARANCE: Alert, conversant. Appropriately groomed. No acute distress.  VASCULAR: Pedal pulses are  Palpable but diminished  at  [facility] and PT bilateral.  Capillary refill time is immediate to all digits,  Normal temperature gradient.  NEUROLOGIC: sensation is absent  to 5.07 monofilament at 5/5 sites bilateral.  Light touch is absent  bilateral, Muscle strength normal.  MUSCULOSKELETAL: acceptable muscle strength, tone and stability bilateral.  Intrinsic muscluature intact bilateral.  Rectus appearance of foot and digits noted bilateral.  Ulcer  this ulcer measures 35 mm x 40 mm on the plantar aspect of the left foot. There is healthy granulation tissue noted in the center of this ulcer. No evidence of any drainage, pus or infection noted at this time.  No malodor noted.   DERMATOLOGIC: skin color, texture, and turgor are within normal limits except at the ulcer site.         Assessment & Plan:  Diabetic ulcer left foot.    IE  Debridement of  necrotic tissue was performed.  I oh sore but was applied to the top of the ulcer followed by a dry sterile dressing.  He was then dispensed a cam walker to be worn as he walks.  Patient has a healthy ulcer on the plantar aspect of the left foot but needs immobilization and off weightbearing of the ulcer.  He has a history of 3 knee surgeries on his left foot, which causes him to have an antalgic gait..  I told him to provide home care with soaks and bandaging.  Ambulate with the Cam Walker.  Return to clinic in 2 weeks for further evaluation and treatment.  If this condition worsens or becomes very painful, the patient was told to contact this office or go to the Emergency Department at the hospital.  Gardiner Barefoot DPM

## 2016-07-07 NOTE — Telephone Encounter (Signed)
pc received from pt of need to withdraw from cardiac rehab due to M, W, F hemodialysis schedule.

## 2016-07-08 ENCOUNTER — Encounter: Payer: Self-pay | Admitting: Podiatry

## 2016-07-09 ENCOUNTER — Telehealth: Payer: Self-pay | Admitting: Podiatry

## 2016-07-09 ENCOUNTER — Ambulatory Visit (HOSPITAL_COMMUNITY): Payer: Self-pay

## 2016-07-09 NOTE — Telephone Encounter (Signed)
Pt is getting a fever in his left foot. Stated he is taking tylenol for the fever. Had a place on his foot last night, but covered it and it was gone this morning. Is wearing his cam boot. States he only has the fever of a evening/night.

## 2016-07-09 NOTE — Progress Notes (Signed)
DOS 06.28.2018 Arthroplasty second digit left foot. Metatarsal phalangeal joint capsulotomy second left.

## 2016-07-09 NOTE — Telephone Encounter (Signed)
I spoke with pt and he states he has been running a fever of 100.6 in the evening. I asked pt if he had any change in symptoms of his left foot from office visit on 07/07/2016. Pt states his left foot is the same temperature as the right foot, same swelling and color as 07/07/2016 with less drainage. I asked the pt if he had been in the sun a long time and he said he likes to sit in the sun after being in cold dialysis. I asked pt if he might have just gotten too hot in the sun and he stated he didn't know. Pt stated he had bypass surgery and had veins harvested from his legs so has swelling from their too. I spoke with Dr. Jacqualyn Posey and he directed pt to his cardiologist or the ED, that pt's symptoms may be related to the heart surgery, since the left foot had no change of symptoms since last visit. Pt states his cardiologist and PCP are gone for the day and he will wait a day to see if he needs to go to the ED.

## 2016-07-09 NOTE — Telephone Encounter (Signed)
Patient returned Valery's phone call.

## 2016-07-12 ENCOUNTER — Ambulatory Visit (HOSPITAL_COMMUNITY): Payer: Self-pay

## 2016-07-14 ENCOUNTER — Emergency Department (HOSPITAL_COMMUNITY)
Admission: EM | Admit: 2016-07-14 | Discharge: 2016-07-14 | Disposition: A | Discharge status: Home / Self Care | Payer: Medicare HMO | Attending: Emergency Medicine | Admitting: Emergency Medicine

## 2016-07-14 ENCOUNTER — Encounter (HOSPITAL_COMMUNITY): Payer: Self-pay

## 2016-07-14 ENCOUNTER — Emergency Department (HOSPITAL_COMMUNITY): Payer: Medicare HMO

## 2016-07-14 DIAGNOSIS — N186 End stage renal disease: Secondary | ICD-10-CM | POA: Insufficient documentation

## 2016-07-14 DIAGNOSIS — Z992 Dependence on renal dialysis: Secondary | ICD-10-CM | POA: Diagnosis not present

## 2016-07-14 DIAGNOSIS — M79671 Pain in right foot: Secondary | ICD-10-CM | POA: Diagnosis present

## 2016-07-14 DIAGNOSIS — I509 Heart failure, unspecified: Secondary | ICD-10-CM | POA: Diagnosis not present

## 2016-07-14 DIAGNOSIS — D649 Anemia, unspecified: Secondary | ICD-10-CM | POA: Diagnosis not present

## 2016-07-14 DIAGNOSIS — Z87891 Personal history of nicotine dependence: Secondary | ICD-10-CM | POA: Diagnosis not present

## 2016-07-14 DIAGNOSIS — L97512 Non-pressure chronic ulcer of other part of right foot with fat layer exposed: Secondary | ICD-10-CM | POA: Insufficient documentation

## 2016-07-14 DIAGNOSIS — E1140 Type 2 diabetes mellitus with diabetic neuropathy, unspecified: Secondary | ICD-10-CM | POA: Insufficient documentation

## 2016-07-14 DIAGNOSIS — I120 Hypertensive chronic kidney disease with stage 5 chronic kidney disease or end stage renal disease: Secondary | ICD-10-CM | POA: Diagnosis not present

## 2016-07-14 DIAGNOSIS — M869 Osteomyelitis, unspecified: Secondary | ICD-10-CM

## 2016-07-14 LAB — CBC WITH DIFFERENTIAL/PLATELET
BASOS ABS: 0 10*3/uL (ref 0.0–0.1)
Basophils Relative: 0 %
EOS ABS: 0.1 10*3/uL (ref 0.0–0.7)
EOS PCT: 1 %
HCT: 31.7 % — ABNORMAL LOW (ref 39.0–52.0)
Hemoglobin: 9.6 g/dL — ABNORMAL LOW (ref 13.0–17.0)
LYMPHS PCT: 11 %
Lymphs Abs: 1.1 10*3/uL (ref 0.7–4.0)
MCH: 26.9 pg (ref 26.0–34.0)
MCHC: 30.3 g/dL (ref 30.0–36.0)
MCV: 88.8 fL (ref 78.0–100.0)
Monocytes Absolute: 1 10*3/uL (ref 0.1–1.0)
Monocytes Relative: 10 %
NEUTROS PCT: 78 %
Neutro Abs: 8 10*3/uL — ABNORMAL HIGH (ref 1.7–7.7)
PLATELETS: 191 10*3/uL (ref 150–400)
RBC: 3.57 MIL/uL — ABNORMAL LOW (ref 4.22–5.81)
RDW: 17.4 % — ABNORMAL HIGH (ref 11.5–15.5)
WBC: 10.1 10*3/uL (ref 4.0–10.5)

## 2016-07-14 LAB — BASIC METABOLIC PANEL
ANION GAP: 11 (ref 5–15)
BUN: 27 mg/dL — AB (ref 6–20)
CO2: 30 mmol/L (ref 22–32)
Calcium: 8.8 mg/dL — ABNORMAL LOW (ref 8.9–10.3)
Chloride: 95 mmol/L — ABNORMAL LOW (ref 101–111)
Creatinine, Ser: 4.77 mg/dL — ABNORMAL HIGH (ref 0.61–1.24)
GFR, EST AFRICAN AMERICAN: 14 mL/min — AB (ref >60)
GFR, EST NON AFRICAN AMERICAN: 12 mL/min — AB (ref >60)
Glucose, Bld: 106 mg/dL — ABNORMAL HIGH (ref 65–99)
POTASSIUM: 4.3 mmol/L (ref 3.5–5.1)
SODIUM: 136 mmol/L (ref 135–145)

## 2016-07-14 LAB — C-REACTIVE PROTEIN: CRP: 9.8 mg/dL — AB (ref <1.0)

## 2016-07-14 MED ORDER — SULFAMETHOXAZOLE-TRIMETHOPRIM 800-160 MG PO TABS
1.0000 | ORAL_TABLET | Freq: Once | ORAL | Status: AC
  Administered 2016-07-14: 1 via ORAL
  Filled 2016-07-14: qty 1

## 2016-07-14 MED ORDER — SULFAMETHOXAZOLE-TRIMETHOPRIM 800-160 MG PO TABS: 1.0000 | ORAL_TABLET | Freq: Two times a day (BID) | ORAL | 0 refills | Status: AC

## 2016-07-14 NOTE — ED Notes (Signed)
Attempted IV stick X1, unable to successfully place IV but was able to collect blood.

## 2016-07-14 NOTE — Discharge Instructions (Signed)
You were seen in the ED for evaluation of an ulcer to the right foot. The labs and x-ray are normal. We are starting an oral antibiotic and will have you see the Podiatrist tomorrow in clinic.   Return to the ED immediately with any worsening pain, redness, drainage, or temperature greater than 100.4 F. Wear the boot provided and keep weight off of the affected foot to help with healing.

## 2016-07-14 NOTE — ED Triage Notes (Signed)
[facility]- Pt coming from dialysis with possible infection of left foot. He has been running fever at home per pt and dialysis center. Highest recorded temp was 100.9, he had 650mg  of tylenol, 1.75 g of Vanc and 2g of Fortaz at dialysis. Pt had 2.5 hrs of dialysis treatment.   142/82 81bpm 98% room air  18resp

## 2016-07-14 NOTE — ED Provider Notes (Signed)
Emergency Department Provider Note   By signing my name below, I, Soijett Blue, attest that this documentation has been prepared under the direction and in the presence of Alandis Bluemel, Wonda Olds, MD. Electronically Signed: Soijett Blue, ED Scribe. 07/14/16. 8:47 AM.  I have reviewed the triage vital signs and the nursing notes.   HISTORY  Chief Complaint Foot Pain   HPI Matthew Kline is a 56 y.o. male with a PMHx of DM, dialysis pt, ESRD, HTN, who presents to the Emergency Department BIB [facility] complaining of left foot pain onset 5 weeks ago. Pt reports associated gradually worsening left plantar ulcer x 5 weeks worsening, fever (TMAX 100.9), and redness to left lateral foot. He notes that his fever mainly occurs at night and his baseline temp is 97.6. Pt was given 650 mg tylenol, 1.75 g vancomycin, and 2 g fortaz while at dialysis with no relief of his symptoms. Pt reports that while at dialysis he had blood cultures completed as well as empirical treatment. He states that he was evaluated by a Podiatrist at [facility] for his left plantar ulcer and was given a cam-walker boot. Pt reports that he noticed that his left plantar ulcer was worsening and that he had a low-grade fever to which he attempted to be evaluated by his podiatrist with no available appointments. He denies CP, SOB, and any other symptoms. He states that he recently had triple bypass surgery.    Per pt chart review: Pt was seen at [facility] on 07/07/2016 for left plantar ulcer. Pt had the area debrided in the office and was given cam-walker boot and informed on wound care treatment. Pt was informed to follow up in the office on 07/15/2016 for follow up treatment.     Past Medical History:  Diagnosis Date  . Allergy   . Anemia, unspecified   . Anxiety   . Arthralgia 2010   polyarticular  . Arthritis    "back, knees" (05/09/2014)  . CHF (congestive heart failure) ([place])   . CHF (congestive heart  failure) ([place]) 07/25/2009   denies  . Chronic lower back pain   . Coronary artery disease   . Coughing    pt. reports that he has drainage from sinus infection  . Diabetic neuropathy ([place])   . ESRD (end stage renal disease) on dialysis [facility])    started 12/2012; "MWF; [place] "   . GERD (gastroesophageal reflux disease)    hx "before I lost weight", no problem 9 years  . Hemodialysis access site with mature fistula ([place])   . Hemorrhoids, internal 10/2011   small  . History of blood transfusion    "related to the anemia"  . Hypertension   . Insomnia, unspecified   . Knee pain, left   . 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 [place]by [place] term (current) use of anticoagulants   . Myocardial infarction ([place]) 1995  . Orthostatic hypotension   . Osteomyelitis of foot, left, acute ([place])   . Other chronic postoperative pain   . Pneumonia    "probably 4-5 times" (05/09/2014)  . Polymyalgia rheumatica ([place])   . Renal insufficiency   . Sleep apnea    "lost weight; no more problem" (05/09/2014)  . Stroke ([place]) 01/10/06   denies residual on 05/09/2014  . Type II diabetes mellitus ([place]) dx'd 1995  . Ulcer    diabetic foot   . Unspecified hereditary and idiopathic peripheral neuropathy  feet  . Unspecified osteomyelitis, site unspecified   . Unspecified vitamin D deficiency     Patient Active Problem List   Diagnosis Date Noted  . S/P revision of total knee 11/26/2015  . Pain in the chest   . Acute on chronic diastolic heart failure ([place]) 07/05/2015  . Volume overload 07/04/2015  . Shortness of breath 07/04/2015  . Hypoxemia 07/04/2015  . Elevated troponin   . End-stage renal disease on hemodialysis ([place])   . Hypervolemia   . 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 Septic joint of left knee joint ([place]) 08/07/2014  . Tachycardia 07/24/2014  . Acute upper respiratory infection 07/24/2014  . Sepsis ([place]) 07/14/2014  . ESRD (end stage renal disease)  ([place]) 07/14/2014  . Type II diabetes mellitus ([place]) 07/14/2014  . Anemia in chronic kidney disease 07/14/2014  . Infection of total knee replacement ([place]) 05/31/2014  . Surgical wound dehiscence 05/09/2014  . Dehiscence of closure of skin 05/09/2014  . Total knee replacement status 04/10/2014  . Diabetes mellitus with renal manifestations, controlled ([place]) 10/24/2013  . Hypertensive renal disease 06/27/2013  . DM type 2 causing vascular disease ([place]) 06/27/2013  . Erectile dysfunction 06/27/2013  . Depression 06/27/2013  . Claudication of left lower extremity ([place]) 12/19/2012  . Essential hypertension, benign 12/19/2012  . Sinusitis, acute maxillary 11/22/2012  . Otitis, externa, infective 11/14/2012  . Leg edema, left 11/14/2012  . End stage renal disease ([place]) 10/02/2012  . Controlled type 2 DM with proteinuria or microalbuminuria 09/19/2012  . GERD (gastroesophageal reflux disease) 09/19/2012  . Leukocytosis, unspecified 09/19/2012  . Lacunar infarction ([place]) 08/17/2012  . Polymyalgia rheumatica ([place]) 08/17/2012  . 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 Unspecified essential hypertension 05/10/2012  . Unspecified vitamin D deficiency 05/10/2012  . Diabetes mellitus due to underlying condition ([place]) 05/10/2012  . Hyperlipidemia LDL goal <100 05/10/2012  . Anemia of chronic disease 05/10/2012  . Screening for prostate cancer 05/10/2012  . Chronic kidney disease (CKD), stage IV (severe) ([place]) 05/10/2012  . Peripheral autonomic neuropathy due to DM ([place]) 05/10/2012  . Callus of foot 05/10/2012  . Urgency of urination 05/10/2012  . Hyperkalemia 05/10/2012  . Candidiasis of the esophagus 10/12/2011  . Internal hemorrhoids without mention of complication 40/98/1191  . Pre-syncope 07/25/2009  . DJD (degenerative joint disease) of cervical spine 02/17/2009    Past Surgical History:  Procedure Laterality Date  . AMPUTATION  01/21/2012   Procedure: AMPUTATION RAY;  Surgeon: [name], MD;  Location: [place];  Service: Orthopedics;  Laterality: Left;  Left Foot 4th Ray Amputation  . AMPUTATION Left 05/04/2013   Procedure: AMPUTATION DIGIT;  Surgeon: [name], MD;  Location: [place];  Service: Orthopedics;  Laterality: Left;  Left Great Toe Amputation at [facility]  . ANTERIOR CERVICAL DECOMP/DISCECTOMY FUSION  02/2011  . BASCILIC VEIN TRANSPOSITION Left 10/19/2012   Procedure: BASCILIC VEIN TRANSPOSITION;  Surgeon: Serafina Mitchell, MD;  Location: [place];  Service: Vascular;  Laterality: Left;  . CORONARY ARTERY BYPASS GRAFT     x 5 with lima at [place] WITH ANTIBIOTIC SPACERS Left 08/07/2014   Procedure: Replace Left Total Knee Arthroplasty,  Place Antibiotic Spacer;  Surgeon: [name], MD;  Location: [place];  Service: Orthopedics;  Laterality: Left;  . I&D EXTREMITY Left 05/09/2014   Procedure: Irrigation and Debridement Left Knee and Closure of Total Knee Arthroplasty Incision;  Surgeon: [name], MD;  Location: [place];  Service: Orthopedics;  Laterality: Left;  . I&D KNEE WITH POLY EXCHANGE Left 05/31/2014   Procedure: IRRIGATION AND DEBRIDEMENT LEFT KNEE, PLACE ANTIBIOTIC BEADS,  POLY EXCHANGE;  Surgeon: [name], MD;  Location: [place];  Service: Orthopedics;  Laterality: Left;  . KNEE ARTHROSCOPY Left 08-25-2012  . REFRACTIVE SURGERY Bilateral   . TOE AMPUTATION Bilateral    "I've lost 7 toes over the last 7 years" (05/09/2014)  . TOE SURGERY Left April 2015   Big toe removed on left foot.  . TONSILLECTOMY    . TOTAL KNEE ARTHROPLASTY Left 04/10/2014   Procedure: TOTAL KNEE ARTHROPLASTY;  Surgeon: [name], MD;  Location: [place];  Service: Orthopedics;  Laterality: Left;  . TOTAL KNEE REVISION Left 10/25/2014   Procedure: LEFT TOTAL KNEE REVISION;  Surgeon: [name], MD;  Location: [place];  Service: Orthopedics;  Laterality: Left;  . TOTAL KNEE REVISION Left 11/26/2015   Procedure: Removal Left Total Knee  Arthroplasty, Hinged Total Knee Arthroplasty;  Surgeon: [name], MD;  Location: [place];  Service: Orthopedics;  Laterality: Left;  . UVULOPALATOPHARYNGOPLASTY, TONSILLECTOMY AND SEPTOPLASTY  ~ 1989  . WOUND DEBRIDEMENT Left 05/09/2014   Dehiscence Left Total Knee Arthroplasty Incision    Current Outpatient Rx  . Order #: [id number] Class: Normal  . Order #: [id number] Class: Historical Med  . Order #: [id number] Class: Normal  . Order #: [id number] Class: Normal  . Order #: [id number] Class: Historical Med  . Order #: [id number] Class: Historical Med  . Order #: [id number] Class: Normal  . Order #: [id number] Class: Normal  . Order #: [id number] Class: Normal  . Order #: [id number] Class: Historical Med  . Order #: [id number] Class: Print  . Order #: [id number] Class: Historical Med  . Order #: [id number] Class: Historical Med  . Order #: [id number] Class: Print  . Order #: [id number] Class: Historical Med    Allergies Morphine and related; Tygacil [tigecycline]; and Morphine  Family History  Problem Relation Age of Onset  . Hypertension Mother   . Cancer Mother 14       Ovarian  . Heart disease Maternal Aunt   . Stroke Maternal Grandfather     Social History Social History  Substance Use Topics  . Smoking status: Former Smoker    Packs/day: 0.12    Years: 32.00    Types: Cigarettes    Quit date: 05/28/2016  . Smokeless tobacco: Former Systems developer    Quit date: 10/06/2014     Comment: 3 -4 cig daily   . Alcohol use No    Review of Systems Constitutional: Positive for fever (TMAX 100.9). No chills Eyes: No visual changes. ENT: No sore throat. Cardiovascular: Denies chest pain. Respiratory: Denies shortness of breath. Gastrointestinal: No abdominal pain.  No nausea, no vomiting.  No diarrhea.  No constipation. Genitourinary: Negative for dysuria. Musculoskeletal: Positive for left foot pain. Negative for back pain. Skin: Positive for left plantar ulcer. Positive for redness to left lateral  foot. Negative for rash. Neurological: Negative for headaches, focal weakness or numbness.  10-point ROS otherwise negative.  ____________________________________________   PHYSICAL EXAM:  VITAL SIGNS: ED Triage Vitals  Enc Vitals Group     BP 07/14/16 1152 (!) 158/86     Pulse Rate 07/14/16 1152 74     Resp 07/14/16 1152 20     Temp 07/14/16 1152 98.3 F (36.8 C)     Temp Source 07/14/16 1152 Oral     SpO2 07/14/16 1152 94 %     Weight  07/14/16 1153 191 lb 12.8 oz (87 kg)     Height 07/14/16 1153 5\' 9"  (1.753 m)   Constitutional: Alert and oriented. Well appearing and in no acute distress. Eyes: Conjunctivae are normal.  Head: Atraumatic. Nose: No congestion/rhinnorhea. Mouth/Throat: Mucous membranes are moist.  Oropharynx non-erythematous. Neck: No stridor. Cardiovascular: Normal rate, regular rhythm. Good peripheral circulation. Grossly normal heart sounds.   Respiratory: Normal respiratory effort.  No retractions. Lungs CTAB. Gastrointestinal: Soft and nontender. No distention.  Musculoskeletal: No lower extremity tenderness nor edema. No gross deformities of extremities. Neurologic:  Normal speech and language. No gross focal neurologic deficits are appreciated.  Skin:  Skin is warm and dry. 3.5 x 4 cm ulceration to the bottom of the right foot. Good granulation tissue noted. No pus or drainage. Mild erythema over the dorsum of the right foot overlying a small portion of the 1st metatarsal only. Minimal surrounding erythema. No induration.   ____________________________________________   LABS (all labs ordered are listed, but only abnormal results are displayed)  Labs Reviewed  BASIC METABOLIC PANEL - Abnormal; Notable for the following:       Result Value   Chloride 95 (*)    Glucose, Bld 106 (*)    BUN 27 (*)    Creatinine, Ser 4.77 (*)    Calcium 8.8 (*)    GFR calc non Af Amer 12 (*)    GFR calc Af Amer 14 (*)    All other components within normal limits    CBC WITH DIFFERENTIAL/PLATELET - Abnormal; Notable for the following:    RBC 3.57 (*)    Hemoglobin 9.6 (*)    HCT 31.7 (*)    RDW 17.4 (*)    Neutro Abs 8.0 (*)    All other components within normal limits  C-REACTIVE PROTEIN - Abnormal; Notable for the following:    CRP 9.8 (*)    All other components within normal limits   ____________________________________________  RADIOLOGY  Dg Foot Complete Left  Result Date: 07/14/2016 CLINICAL DATA:  Diabetic patient with a large ulceration on the plantar surface of the left foot. EXAM: LEFT FOOT - COMPLETE 3+ VIEW COMPARISON:  None. FINDINGS: The great toe has been amputated. There has also been amputation at the level of the proximal fourth metatarsal. Extensive neuropathic change is present about the midfoot. A skin ulceration is seen on the plantar surface of the foot at the level of the fifth metatarsal. There is marked soft tissue swelling present. No bony destructive change is identified. No radiopaque foreign body. IMPRESSION: Skin ulceration plantar surface of the lateral left foot without evidence of osteomyelitis. Status post amputation of the great toe and at the level of the proximal fourth metatarsal. Advanced neuropathic change of the midfoot. Electronically Signed   By: Inge Rise M.D.   On: 07/14/2016 13:11    ____________________________________________   PROCEDURES  Procedure(s) performed:   Procedures  None ____________________________________________   INITIAL IMPRESSION / ASSESSMENT AND PLAN / ED COURSE  Pertinent labs & imaging results that were available during my care of the patient were reviewed by me and considered in my medical decision making (see chart for details).  Patient resents to the emergency department for evaluation of fever and worsening redness in the setting of right foot ulcer. The ulcers on the plantar surface. It was debrided recently by podiatry but has developed some mild biofilm  and a portion of the ulcer that tracks deeper into the foot. Wound cultures obtained at  HD today. Patient already given 2g Vancomycin and Fortaz at HD today. No fever here. Plan for labs and plain film of the foot to evaluate for osteomyelitis.   02:30 PM Spoke with Dr. Amalia Hailey with Podiatry and reviewed the case by phone. With only mild erythema at this time will hold on MRI and admission for IV abx. Will start Bactrim. The patient has an appointment tomorrow with Podiatry but can call Dr. Amalia Hailey if needed.   At this time, I do not feel there is any life-threatening condition present. I have reviewed and discussed all results (EKG, imaging, lab, urine as appropriate), exam findings with patient. I have reviewed nursing notes and appropriate previous records.  I feel the patient is safe to be discharged home without further emergent workup. Discussed usual and customary return precautions. Patient and family (if present) verbalize understanding and are comfortable with this plan.  Patient will follow-up with their primary care provider. If they do not have a primary care provider, information for follow-up has been provided to them. All questions have been answered.  ____________________________________________  FINAL CLINICAL IMPRESSION(S) / ED DIAGNOSES  Final diagnoses:  Skin ulcer of right foot with fat layer exposed ([place])     MEDICATIONS GIVEN DURING THIS VISIT:  Medications  sulfamethoxazole-trimethoprim (BACTRIM DS,SEPTRA DS) 800-160 MG per tablet 1 tablet (1 tablet Oral Given 07/14/16 1517)     NEW OUTPATIENT MEDICATIONS STARTED DURING THIS VISIT:  Discharge Medication List as of 07/14/2016  2:48 PM    START taking these medications   Details  sulfamethoxazole-trimethoprim (BACTRIM DS,SEPTRA DS) 800-160 MG tablet Take 1 tablet by mouth 2 (two) times daily., Starting Wed 07/14/2016, Until Wed 07/21/2016, Print        Note:  This document was prepared using Dragon voice recognition  software and may include unintentional dictation errors.  Nanda Quinton, MD Emergency Medicine  I personally performed the services described in this documentation, which was scribed in my presence. The recorded information has been reviewed and is accurate.       Margette Fast, MD 07/15/16 [phone]

## 2016-07-15 ENCOUNTER — Ambulatory Visit (INDEPENDENT_AMBULATORY_CARE_PROVIDER_SITE_OTHER): Payer: Medicare HMO | Admitting: Podiatry

## 2016-07-15 ENCOUNTER — Encounter: Payer: Self-pay | Admitting: Podiatry

## 2016-07-15 VITALS — Temp 96.6°F

## 2016-07-15 DIAGNOSIS — L02611 Cutaneous abscess of right foot: Secondary | ICD-10-CM

## 2016-07-15 DIAGNOSIS — L97421 Non-pressure chronic ulcer of left heel and midfoot limited to breakdown of skin: Secondary | ICD-10-CM | POA: Diagnosis not present

## 2016-07-15 DIAGNOSIS — L03031 Cellulitis of right toe: Secondary | ICD-10-CM

## 2016-07-15 DIAGNOSIS — E11621 Type 2 diabetes mellitus with foot ulcer: Secondary | ICD-10-CM

## 2016-07-15 NOTE — Progress Notes (Signed)
This patient presents the office for continued evaluation of the diabetic ulcer left foot.  He was seen in my office 1 week ago and diagnosed with an ulcer on the plantar aspect of his left foot. He was told to perform home soaks and bandaging of the ulcer.  He was also told to walk in a [employer].  On Friday of last week at 3:00 he called to the office and the office was closed.  He says he was concerned since he started to run a fever.  He states that he started to develop fever and chills the last few days.  He says he went to the emergency room and an x-ray was taken to check for any bone infection.  No infection was noted.  He also had blood studies done to evaluate to see if there was an increased white blood cell.  This was within normal limits.  He was told to present to the office today for my evaluation of his left diabetic ulcer.  He says that earlier this morning he started to develop a red, blister on the outside of the left foot.  This has enlarged and become very fluctuant through the course of the day.  He presents the office today for an evaluation of his left foot and wearing the cam walker that was dispensed.   This patient does admit he was given Bactrim as an antibiotic to take which he started last night.  He presents the office today for continued evaluation and treatment of his diabetic ulcer left foot  GENERAL APPEARANCE: Alert, conversant. Appropriately groomed. No acute distress.  VASCULAR: Pedal pulses are  Palpable but diminished    DP and PT bilateral.  Capillary refill time is immediate to all digits,  Normal temperature gradient.  Digital hair growth is present bilateral  NEUROLOGIC: sensation is absent  to 5.07 monofilament at 5/5 sites bilateral.  Light touch is absent  bilateral, Muscle strength normal.  MUSCULOSKELETAL: acceptable muscle strength, tone and stability bilateral.  Intrinsic muscluature intact bilateral.  Rectus appearance of foot and digits noted bilateral.  Amputation of the left hallux was noted there is also an amputation of the fourth ray of the left foot.   ULCER  the plantar ulcer on the left foot measures 40 mm x 45 mm at the visit this morning.  There is healthy granulation tissue noted except in the center of the ulcer. No evidence of any pus or drainage from the plantar ulcer.  Evaluation of the dorsal lateral aspect of the left foot does reveal a red, fluctuant blister noted at the base of the fifth meta-base left.  There is a cellulitis noted around the blood filled blister.     DERMATOLOGIC: skin color, texture, and turgor are within normal limits except at the plantar ulcer left foot and the blood filled blister on the left foot   Infected diabetic ulcer left foot.   Diabetes with neuropathy.   Examination was directed to the blood filled blister on the dorsolateral aspect of the left foot.  Using a #15 blade the blood was released at the site of the blister.  A culture and sensitivity was then taken at the site of the blister at the base of the blister.  Dr. Milinda Pointer was then consulted and requested to see this patient with me.  He said that the blood filled blister was the result of undermining from the plantar ulcer.  The left foot was then bandaged with Iodosorb dry  sterile dressing and Coban.  He was told to change the bandage daily, but not to soak his foot.  He was instructed to take antibiotics as previously directed.  He was then given a prescription to pick up a stroller or crutches, which ever he believes would be easier for him to utilize.    He was told to return to the office in one week and make an appointment with Dr. Jacqualyn Posey.  If this condition worsens or becomes very painful, the patient was told to contact this office or go to the Emergency Department at the hospital.  Gardiner Barefoot DPM

## 2016-07-16 ENCOUNTER — Ambulatory Visit (HOSPITAL_COMMUNITY): Payer: Self-pay

## 2016-07-19 ENCOUNTER — Ambulatory Visit (HOSPITAL_COMMUNITY): Payer: Self-pay

## 2016-07-20 LAB — WOUND CULTURE: Gram Stain: NONE SEEN

## 2016-07-21 ENCOUNTER — Ambulatory Visit (HOSPITAL_COMMUNITY): Payer: Self-pay

## 2016-07-22 ENCOUNTER — Telehealth: Payer: Self-pay | Admitting: *Deleted

## 2016-07-22 ENCOUNTER — Encounter: Payer: Self-pay | Admitting: Podiatry

## 2016-07-22 ENCOUNTER — Ambulatory Visit (INDEPENDENT_AMBULATORY_CARE_PROVIDER_SITE_OTHER): Payer: Self-pay | Admitting: Podiatry

## 2016-07-22 VITALS — Temp 97.7°F

## 2016-07-22 DIAGNOSIS — E11621 Type 2 diabetes mellitus with foot ulcer: Secondary | ICD-10-CM

## 2016-07-22 DIAGNOSIS — M869 Osteomyelitis, unspecified: Principal | ICD-10-CM

## 2016-07-22 DIAGNOSIS — E1169 Type 2 diabetes mellitus with other specified complication: Principal | ICD-10-CM

## 2016-07-22 DIAGNOSIS — L97421 Non-pressure chronic ulcer of left heel and midfoot limited to breakdown of skin: Secondary | ICD-10-CM

## 2016-07-22 DIAGNOSIS — Z720 Tobacco use: Secondary | ICD-10-CM | POA: Insufficient documentation

## 2016-07-22 DIAGNOSIS — G629 Polyneuropathy, unspecified: Secondary | ICD-10-CM | POA: Insufficient documentation

## 2016-07-22 DIAGNOSIS — R0989 Other specified symptoms and signs involving the circulatory and respiratory systems: Secondary | ICD-10-CM

## 2016-07-22 DIAGNOSIS — L97422 Non-pressure chronic ulcer of left heel and midfoot with fat layer exposed: Secondary | ICD-10-CM

## 2016-07-22 DIAGNOSIS — L97509 Non-pressure chronic ulcer of other part of unspecified foot with unspecified severity: Principal | ICD-10-CM

## 2016-07-22 MED ORDER — CLINDAMYCIN HCL 300 MG PO CAPS: 300.0000 mg | ORAL_CAPSULE | Freq: Three times a day (TID) | ORAL | 2 refills | Status: DC

## 2016-07-22 NOTE — Telephone Encounter (Addendum)
-----   Message from Trula Slade, DPM sent at 07/22/2016  2:16 PM EDT ----- Can you please order an MRI of the left foot to rule out abscess/osteomyelitis without contrast due to kidney function. Orders given to [facility] for pre-cert and faxed to [place].Dr. Jacqualyn Posey ordered Collagen alginate for daily dressing changed to wounds to left foot measuring 5. X 4.x  0.4cm and 3.5 x 1.5 x 0.4cm, faxed to [facility].07/27/2016-Pt presents to office stating he would like directions for his collagen AG and that he is having fever and chills at night from the antibiotic. Pt states he called Friday and no one returned his call. I told pt that if he called after 12:00pm, I was out of the office and 2 other staff members were taking care of my calls.  I informed pt the collagen AG was to be applied to the wound daily after cleansing with warm soapy water then rinsed and patted dry, and cover with gauze. I told pt I would send a message to Dr. Jacqualyn Posey concerning the clindamycin and call again. I informed pt, Dr. Jacqualyn Posey ordered stop the clindamycin and begin Bactrim DS. Pt states understanding.

## 2016-07-23 ENCOUNTER — Ambulatory Visit (HOSPITAL_COMMUNITY): Payer: Self-pay

## 2016-07-23 NOTE — Telephone Encounter (Signed)
Pt called requesting directions and instructions for wound packing.

## 2016-07-25 DIAGNOSIS — E11621 Type 2 diabetes mellitus with foot ulcer: Secondary | ICD-10-CM | POA: Insufficient documentation

## 2016-07-25 DIAGNOSIS — L97422 Non-pressure chronic ulcer of left heel and midfoot with fat layer exposed: Principal | ICD-10-CM

## 2016-07-25 NOTE — Progress Notes (Signed)
Subjective: 56 year old male presents the office today for concerns of an ongoing ulceration to the plantar aspect the left foot. He states after triple bypass he started physical therapy is on his feet more noticeable blister form and turned into a wound. After the wound started on the bottom of the foot he started to get a blister to the side of the foot which was drained however this also developed into a wound. He has completed a course of Bactrim and he states that since on the antibiotic the wound is actually been doing better. When he was looking to the wound today he feels that the skin is starting to come over top of it although very slowly. He notices some clear drainage but no pus. Denies any swelling redness or any red streaks. Denies any increase in swelling.  Denies any systemic complaints such as fevers, chills, nausea, vomiting. No acute changes since last appointment, and no other complaints at this time.   Objective: AAO x3, NAD DP/PT pulses decreased , CRT less than 3 seconds On the plantar aspect the left foot there is a very large ulceration measuring 5 x 4 cm on the plantar aspect of the foot. On the more medial aspect of the foot over the area that had a previous blister that is also superficial wound measuring approximately 3.5 x 1.5 centimeters. On the plantar aspect of the foot there is mild tenderness to palpation of the wound. The wound has adapted approximate 0.6 cm. There is no probing to bone, undermining or tunneling. There is no swelling erythema, ascending cellulitis. There is no fluctuance, crepitus, malodor. No open lesions or pre-ulcerative lesions.  No pain with calf compression, swelling, warmth, erythema  Assessment: Large ulceration left foot  Plan: -All treatment options discussed with the patient including all alternatives, risks, complications.  -At this point given sided ulceration I recommend an MRI to rule out abscess and osteomyelitis. This was ordered  for him today. -Prescribed clindamycin for him. -Remain in CAM boot. Nonweightbearing. -I border wound care supplies with collagen/Silver dressing. His ordered through present. -I was can ordered arterial studies/ABI Done in the office today however he left his appointment before getting this done. -Pending MRI might need surgical debridement. Also he should have his circulation evaluated. I'll try to the ABI done next appointment. -Patient encouraged to call the office with any questions, concerns, change in symptoms.   Celesta Gentile, DPM

## 2016-07-26 ENCOUNTER — Ambulatory Visit (HOSPITAL_COMMUNITY): Payer: Self-pay

## 2016-07-27 MED ORDER — SULFAMETHOXAZOLE-TRIMETHOPRIM 800-160 MG PO TABS: 1.0000 | ORAL_TABLET | Freq: Two times a day (BID) | ORAL | 0 refills | Status: DC

## 2016-07-27 NOTE — Telephone Encounter (Signed)
Stop clindamycin. Can restart Bactrim DS 1 PO BID x 10 days.

## 2016-07-28 ENCOUNTER — Ambulatory Visit (HOSPITAL_COMMUNITY): Payer: Self-pay

## 2016-07-29 ENCOUNTER — Ambulatory Visit (INDEPENDENT_AMBULATORY_CARE_PROVIDER_SITE_OTHER): Payer: Medicare HMO | Admitting: Podiatry

## 2016-07-29 ENCOUNTER — Ambulatory Visit: Payer: Medicare HMO | Admitting: Podiatry

## 2016-07-29 ENCOUNTER — Encounter: Payer: Self-pay | Admitting: Podiatry

## 2016-07-29 DIAGNOSIS — I739 Peripheral vascular disease, unspecified: Secondary | ICD-10-CM

## 2016-07-29 DIAGNOSIS — L97422 Non-pressure chronic ulcer of left heel and midfoot with fat layer exposed: Secondary | ICD-10-CM

## 2016-07-30 ENCOUNTER — Ambulatory Visit (HOSPITAL_COMMUNITY): Payer: Self-pay

## 2016-07-30 ENCOUNTER — Telehealth: Payer: Self-pay | Admitting: Podiatry

## 2016-07-30 MED ORDER — COLLAGENASE 250 UNIT/GM EX OINT: 1.0000 "application " | TOPICAL_OINTMENT | Freq: Every day | CUTANEOUS | 2 refills | Status: DC

## 2016-07-30 NOTE — Progress Notes (Signed)
Subjective: 56 year old male presents the office today for concerns of an ongoing ulceration to the plantar aspect the left foot. Patient presents today for follow-up. He appears to be frustrated by the wound although he states it is getting better. His MRI is scheduled for next week. He has been applying the collagen, Silver dressing change refills that is sticking to the wound. He has continued with antibiotics. Denies any drainage or pus and denies any surrounding redness or any red streaking. He is remaining the cam boot. Denies any systemic complaints such as fevers, chills, nausea, vomiting. No acute changes since last appointment, and no other complaints at this time.   Objective: AAO x3, NAD DP/PT pulses decreased , CRT less than 3 seconds On the plantar aspect the left foot there is a very large ulceration measuring a total of 9 x 5 cm with a depth of approximately 0 point for symmetry. The wound is larger today because before there was 2 areas of ulceration however now there is a wound connecting the 2 wounds. The more plantar aspect has a diameter 5 cm and laterally it's about 3 cm with a length of 9 cm. Plantarly is more granular wound base. On the lateral aspects more fibrotic. There is no drainage or pus. There is no surrounding erythema, ascending cellulitis. There is no fluctuance, crepitus, malodor. Multiple digital amputations are present. No open lesions or pre-ulcerative lesions.  No pain with calf compression, swelling, warmth, erythema  Assessment: Large ulceration left foot  Plan: -All treatment options discussed with the patient including all alternatives, risks, complications.  -Today I performed an ABI in the office which was read as "PAD". He is known to pain and vascular exam open referral and for him. -MRI scheduled for next week -Finish course of antibiotics -Will switch to stand full dressing changes daily. This is ordered for him today. -Cam boot at all times and  limit weightbearing. -Monitor for any clinical signs or symptoms of infection and directed to call the office immediately should any occur or go to the ER. -RTC 1 week  Celesta Gentile, DPM

## 2016-07-30 NOTE — Telephone Encounter (Signed)
I informed pt I would send order to the manufacture to see if could get insurance to cover. Faxed required form, clinicals and demographics to [place].

## 2016-07-30 NOTE — Telephone Encounter (Addendum)
-----   Message from Trula Slade, DPM sent at 07/29/2016  4:31 PM EDT ----- Can you please put in for a vascular consult due to abnormal abi (we did them in the office) and he is known to them. 07/30/2016-Faxed clinicals and demographics to [facility]. Dr. Jacqualyn Posey ordered sterile 4 x 4 gauze, 1" paper tape daily dressing changes for left foot L97.422, measuring 9 x 5 x 0.3cm with moderate exudate, faxed to [facility].08/05/2016-[facility] Radiology states has stat MRI report. I called and [facility] Radiology staffer read report - shows ulcerations with early osteomyelitis, no drainage for aspiration. I told her I would inform Dr. Jacqualyn Posey the report was available in EPIC.

## 2016-07-30 NOTE — Addendum Note (Signed)
Addended by: Marylou Mccoy L on: 07/30/2016 10:10 AM   Modules accepted: Orders

## 2016-07-30 NOTE — Telephone Encounter (Signed)
I saw Dr. Jacqualyn Posey yesterday and he prescribed me an Rx. He told me to call back if it was too expensive and he would get it ordered through the mail order pharmacy for me. It is too expensive as my cost was over $400. I was wanting to see if he could prescribe it through the mail order pharmacy.

## 2016-08-02 ENCOUNTER — Telehealth: Payer: Self-pay | Admitting: Podiatry

## 2016-08-02 ENCOUNTER — Ambulatory Visit (HOSPITAL_COMMUNITY): Payer: Self-pay

## 2016-08-02 NOTE — Telephone Encounter (Signed)
This is Chief Executive Officer from [place]. I'm calling in regards to the Rx ordered for pt Matthew Kline. Pt has declined the Santyl ointment due to his co pay for the Rx being $237.00. We are putting the Rx on hold. If you have any questions, please call us at [phone].

## 2016-08-02 NOTE — Telephone Encounter (Addendum)
Dr. Jacqualyn Posey ordered collagen AG for daily dressing changes to left foot ulcer L97.422 measuring 9.0 x 5.0 x 0.3cm with moderate exudate, faxed to [facility]. Left message informing pt of the dressing change and to expect a call from [facility].

## 2016-08-02 NOTE — Telephone Encounter (Signed)
Please do a collagen silver dressing through [facility].

## 2016-08-03 ENCOUNTER — Ambulatory Visit: Payer: Medicare HMO | Admitting: Cardiovascular Disease

## 2016-08-03 ENCOUNTER — Telehealth: Payer: Self-pay | Admitting: Podiatry

## 2016-08-03 NOTE — Telephone Encounter (Signed)
Pt called seeing if we could try something else because he is unable to pay the $237 co pay for the santyl ointment through [facility]. Also wants to know if by chance we have any samples of the santyl ointment.

## 2016-08-03 NOTE — Telephone Encounter (Signed)
I spoke with pt and told him I had left a message yesterday informing pt he would be receiving a call from [facility] concerning insurance coverage and shipping for a Collagen with AG as his current daily dressing. Pt asked if that was what he received Saturday in the blue and white containers and I told him no the [facility] order was not sent until yesterday.

## 2016-08-03 NOTE — Progress Notes (Deleted)
Cardiology Office Note   Date:  08/03/2016   ID:  Matthew Kline, DOB July 04, 1960, MRN [id number]  PCP:  Donald Prose, MD  Cardiologist:  [facility]  No chief complaint on file.     History of Present Illness: Matthew Kline is a 56 y.o. male who was referred by [name] for evaluation and management of peripheral arterial disease. He has known history of coronary artery disease status post CABG in May 2018, type 2 diabetes, end-stage renal disease on hemodialysis, essential hypertension, CVA, tobacco use and peripheral neuropathy. He presents with ulceration of the plantar aspect of the left foot. He had previous multiple digit amputations.   Past Medical History:  Diagnosis Date  . Allergy   . Anemia, unspecified   . Anxiety   . Arthralgia 2010   polyarticular  . Arthritis    "back, knees" (05/09/2014)  . CHF (congestive heart failure) ([place])   . CHF (congestive heart failure) ([place]) 07/25/2009   denies  . Chronic lower back pain   . Coronary artery disease   . Coughing    pt. reports that he has drainage from sinus infection  . Diabetic neuropathy ([place])   . ESRD (end stage renal disease) on dialysis [facility])    started 12/2012; "MWF; [place] "   . GERD (gastroesophageal reflux disease)    hx "before I lost weight", no problem 9 years  . Hemodialysis access site with mature fistula ([place])   . Hemorrhoids, internal 10/2011   small  . History of blood transfusion    "related to the anemia"  . Hypertension   . Insomnia, unspecified   . Knee pain, left   . 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 Long term (current) use of anticoagulants   . Myocardial infarction ([place]) 1995  . Orthostatic hypotension   . Osteomyelitis of foot, left, acute ([place])   . Other chronic postoperative pain   . Pneumonia    "probably 4-5 times" (05/09/2014)  . Polymyalgia rheumatica ([place])   . Renal insufficiency   . Sleep apnea    "lost weight; no more problem"  (05/09/2014)  . Stroke ([place]) 01/10/06   denies residual on 05/09/2014  . Type II diabetes mellitus ([place]) dx'd 1995  . Ulcer    diabetic foot   . Unspecified hereditary and idiopathic peripheral neuropathy    feet  . Unspecified osteomyelitis, site unspecified   . Unspecified vitamin D deficiency     Past Surgical History:  Procedure Laterality Date  . AMPUTATION  01/21/2012   Procedure: AMPUTATION RAY;  Surgeon: [name], MD;  Location: [place];  Service: Orthopedics;  Laterality: Left;  Left Foot 4th Ray Amputation  . AMPUTATION Left 05/04/2013   Procedure: AMPUTATION DIGIT;  Surgeon: [name], MD;  Location: [place];  Service: Orthopedics;  Laterality: Left;  Left Great Toe Amputation at [facility]  . ANTERIOR CERVICAL DECOMP/DISCECTOMY FUSION  02/2011  . BASCILIC VEIN TRANSPOSITION Left 10/19/2012   Procedure: BASCILIC VEIN TRANSPOSITION;  Surgeon: Serafina Mitchell, MD;  Location: [place];  Service: Vascular;  Laterality: Left;  . CORONARY ARTERY BYPASS GRAFT     x 5 with lima at [place] WITH ANTIBIOTIC SPACERS Left 08/07/2014   Procedure: Replace Left Total Knee Arthroplasty,  Place Antibiotic Spacer;  Surgeon: [name], MD;  Location: [place];  Service: Orthopedics;  Laterality: Left;  . I&D EXTREMITY Left 05/09/2014  Procedure: Irrigation and Debridement Left Knee and Closure of Total Knee Arthroplasty Incision;  Surgeon: [name], MD;  Location: [place];  Service: Orthopedics;  Laterality: Left;  . I&D KNEE WITH POLY EXCHANGE Left 05/31/2014   Procedure: IRRIGATION AND DEBRIDEMENT LEFT KNEE, PLACE ANTIBIOTIC BEADS,  POLY EXCHANGE;  Surgeon: [name], MD;  Location: [place];  Service: Orthopedics;  Laterality: Left;  . KNEE ARTHROSCOPY Left 08-25-2012  . REFRACTIVE SURGERY Bilateral   . TOE AMPUTATION Bilateral    "I've lost 7 toes over the last 7 years" (05/09/2014)  . TOE SURGERY Left April 2015   Big toe removed on left foot.  .  TONSILLECTOMY    . TOTAL KNEE ARTHROPLASTY Left 04/10/2014   Procedure: TOTAL KNEE ARTHROPLASTY;  Surgeon: [name], MD;  Location: [place];  Service: Orthopedics;  Laterality: Left;  . TOTAL KNEE REVISION Left 10/25/2014   Procedure: LEFT TOTAL KNEE REVISION;  Surgeon: [name], MD;  Location: [place];  Service: Orthopedics;  Laterality: Left;  . TOTAL KNEE REVISION Left 11/26/2015   Procedure: Removal Left Total Knee Arthroplasty, Hinged Total Knee Arthroplasty;  Surgeon: [name], MD;  Location: [place];  Service: Orthopedics;  Laterality: Left;  . UVULOPALATOPHARYNGOPLASTY, TONSILLECTOMY AND SEPTOPLASTY  ~ 1989  . WOUND DEBRIDEMENT Left 05/09/2014   Dehiscence Left Total Knee Arthroplasty Incision     Current Outpatient Prescriptions  Medication Sig Dispense Refill  . albuterol (PROVENTIL HFA;VENTOLIN HFA) 108 (90 Base) MCG/ACT inhaler Inhale 2 puffs into the lungs every 6 (six) hours as needed for wheezing or shortness of breath. 1 Inhaler 6  . amLODipine (NORVASC) 10 MG tablet Take 10 mg by mouth daily.    Marland Kitchen aspirin 81 MG EC tablet TAKE 1 TABLET EVERY DAY (Patient taking differently: TAKE 2 TABLETs EVERY DAY) 30 tablet 6  . atorvastatin (LIPITOR) 10 MG tablet Take one tablet by mouth once daily at 6pm for cholesterol. **Needs an appointment before anymore refills. (Patient taking differently: 40 mg. Take one tablet by mouth once daily at 6pm for cholesterol. **Needs an appointment before anymore refills.) 30 tablet 0  . calcitRIOL (ROCALTROL) 1 MCG/ML solution Take 1 mcg by mouth every Monday, Wednesday, and Friday with hemodialysis. Calcitriol IV Injection active form of vitamin D.    . carvedilol (COREG) 25 MG tablet Take 25 mg by mouth 2 (two) times daily with a meal.    . collagenase (SANTYL) ointment Apply 1 application topically daily. 90 g 2  . fluticasone (FLONASE) 50 MCG/ACT nasal spray Place 2 sprays into both nostrils daily. (Patient taking differently: Place 2 sprays  into both nostrils daily as needed. ) 16 g 6  . gabapentin (NEURONTIN) 300 MG capsule TAKE ONE CAPSULE BY MOUTH 3 TIMES A DAY (Patient taking differently: TAKE ONE CAPSULE BY MOUTH daily) 270 capsule 0  . montelukast (SINGULAIR) 10 MG tablet TAKE 1 TABLET (10 MG TOTAL) BY MOUTH AT BEDTIME. 90 tablet 1  . multivitamin (RENA-VIT) TABS tablet Take 1 tablet by mouth daily.    Marland Kitchen oxyCODONE-acetaminophen (PERCOCET/ROXICET) 5-325 MG tablet Take 1 tablet by mouth every 4 (four) hours as needed for severe pain. (Patient not taking: Reported on 06/17/2016) 60 tablet 0  . sevelamer carbonate (RENVELA) 800 MG tablet Take 800-3,200 mg by mouth 3 (three) times daily with meals. 800 mg-1600 mg with snacks (depends on dietary intake) and 2400 mg-3200 mg with meals    . sorbitol 70 % solution Take 30-60 mLs by  mouth daily as needed (for constipation).   5  . sulfamethoxazole-trimethoprim (BACTRIM DS,SEPTRA DS) 800-160 MG tablet Take 1 tablet by mouth 2 (two) times daily. 20 tablet 0  . temazepam (RESTORIL) 30 MG capsule Take 30 mg by mouth at bedtime as needed. for sleep  2   No current facility-administered medications for this visit.     Allergies:   Morphine and related; Tygacil [tigecycline]; and Morphine    Social History:  The patient  reports that he quit smoking about 2 months ago. His smoking use included Cigarettes. He has a 3.84 pack-year smoking history. He quit smokeless tobacco use about 21 months ago. He reports that he does not drink alcohol or use drugs.   Family History:  The patient's ***family history includes Cancer (age of onset: 12) in his mother; Heart disease in his maternal aunt; Hypertension in his mother; Stroke in his maternal grandfather.    ROS:  Please see the history of present illness.   Otherwise, review of systems are positive for {NONE DEFAULTED:18576::"none"}.   All other systems are reviewed and negative.    PHYSICAL EXAM: VS:  There were no vitals taken for this visit. ,  BMI There is no height or weight on file to calculate BMI. GEN: Well nourished, well developed, in no acute distress  HEENT: normal  Neck: no JVD, carotid bruits, or masses Cardiac: ***RRR; no murmurs, rubs, or gallops,no edema  Respiratory:  clear to auscultation bilaterally, normal work of breathing GI: soft, nontender, nondistended, + BS MS: no deformity or atrophy  Skin: warm and dry, no rash Neuro:  Strength and sensation are intact Psych: euthymic mood, full affect   EKG:  EKG {ACTION; IS/IS HWE:[id number]} ordered today. The ekg ordered today demonstrates ***   Recent Labs: 07/14/2016: BUN 27; Creatinine, Ser 4.77; Hemoglobin 9.6; Platelets 191; Potassium 4.3; Sodium 136    Lipid Panel    Component Value Date/Time   CHOL 180 07/06/2015 0124   CHOL 228 (H) 09/15/2012 1030   TRIG 118 07/06/2015 0124   HDL 44 07/06/2015 0124   HDL 40 09/15/2012 1030   CHOLHDL 4.1 07/06/2015 0124   VLDL 24 07/06/2015 0124   LDLCALC 112 (H) 07/06/2015 0124   LDLCALC 113 (H) 09/15/2012 1030      Wt Readings from Last 3 Encounters:  07/14/16 191 lb 12.8 oz (87 kg)  06/24/16 199 lb 11.8 oz (90.6 kg)  01/15/16 196 lb (88.9 kg)      Other studies Reviewed: Additional studies/ records that were reviewed today include: ***. Review of the above records demonstrates: ***  No flowsheet data found.    ASSESSMENT AND PLAN:  1.  ***    Disposition:   FU with *** in {gen number 7-89:[id number]} {Days to years:10300}  Signed,  Kathlyn Sacramento, MD  08/03/2016 8:52 AM    [place]

## 2016-08-04 ENCOUNTER — Telehealth: Payer: Self-pay | Admitting: *Deleted

## 2016-08-04 ENCOUNTER — Ambulatory Visit (HOSPITAL_COMMUNITY): Payer: Self-pay

## 2016-08-04 DIAGNOSIS — I739 Peripheral vascular disease, unspecified: Secondary | ICD-10-CM

## 2016-08-04 NOTE — Telephone Encounter (Signed)
Patient has been called and made aware that he needs to come in for a lower extremity arterial duplex before his appointment on 8/7, per Dr. Fletcher Anon. Scheduling will be in touch to make that appointment.

## 2016-08-05 ENCOUNTER — Telehealth: Payer: Self-pay | Admitting: Podiatry

## 2016-08-05 ENCOUNTER — Encounter: Payer: Self-pay | Admitting: Podiatry

## 2016-08-05 ENCOUNTER — Ambulatory Visit (INDEPENDENT_AMBULATORY_CARE_PROVIDER_SITE_OTHER): Payer: Medicare HMO | Admitting: Podiatry

## 2016-08-05 ENCOUNTER — Ambulatory Visit
Admission: RE | Admit: 2016-08-05 | Discharge: 2016-08-05 | Disposition: A | Discharge status: Home / Self Care | Payer: Medicare HMO | Source: Ambulatory Visit | Attending: Podiatry | Admitting: Podiatry

## 2016-08-05 ENCOUNTER — Other Ambulatory Visit: Payer: Self-pay | Admitting: Cardiovascular Disease

## 2016-08-05 DIAGNOSIS — L03119 Cellulitis of unspecified part of limb: Secondary | ICD-10-CM

## 2016-08-05 DIAGNOSIS — I739 Peripheral vascular disease, unspecified: Secondary | ICD-10-CM

## 2016-08-05 DIAGNOSIS — M869 Osteomyelitis, unspecified: Secondary | ICD-10-CM | POA: Diagnosis not present

## 2016-08-05 DIAGNOSIS — L02619 Cutaneous abscess of unspecified foot: Secondary | ICD-10-CM

## 2016-08-05 MED ORDER — CIPROFLOXACIN HCL 500 MG PO TABS: 500.0000 mg | ORAL_TABLET | Freq: Two times a day (BID) | ORAL | 0 refills | Status: DC

## 2016-08-05 MED ORDER — SULFAMETHOXAZOLE-TRIMETHOPRIM 800-160 MG PO TABS: 1.0000 | ORAL_TABLET | Freq: Every day | ORAL | 0 refills | Status: DC

## 2016-08-05 NOTE — Telephone Encounter (Signed)
Can you ask the pharmacist is it OK to take 2 times a day? I kept getting a warning in Epic. If the pharmacist says it is safe for him to take twice a day then I would like that. He is on dialysis.

## 2016-08-05 NOTE — Telephone Encounter (Signed)
I discussed with him today at his appointment

## 2016-08-05 NOTE — Telephone Encounter (Signed)
I informed Clair Gulling - [facility] [phone] that due to pt's kidney function, Dr.Wagoner wanted pt to take one tablet a day.

## 2016-08-05 NOTE — Telephone Encounter (Signed)
Ebony Hail with [facility] [address] called with questions about Rx Dr. Jacqualyn Posey just sent in for the Bactrim DS. Wants to make sure he just wants pt to take 1 pill daily or 1 pill twice a day.

## 2016-08-05 NOTE — Progress Notes (Signed)
Subjective: 56 year old male presents the office today for concerns of an ongoing ulceration to the plantar aspect the left foot. He states he is continued on the Bactrim. He states that he feels that the wound is getting better on the bottom but he states that the wound on the outside aspect is not looking good. He denies any pus coming from the area but he isn't cleared of bloody drainage. Denies any surrounding redness or any red streaks. Denies any pain.  He states that he has a appointment with Dr. Fletcher Anon tomorrow to address the circulation.   He had a MRI this morning.   Denies any systemic complaints such as fevers, chills, nausea, vomiting. No acute changes since last appointment, and no other complaints at this time.   Objective: AAO x3, NAD DP/PT pulses decreased , CRT less than 3 seconds On the plantar aspect the left foot there is a very large ulceration measuring a total of 9 x 5 cm with a depth of approximately 0.4 cm.  The wound measures about the same size in diameter compared to last appointment however along the lateral aspect there is now visible tendon exposed. The wound base is granular along the plantar aspect there is fibrotic tissue as well as granulation tissue on the lateral aspect. Unable to identify any area of drainage or pus. There is a faint amount of erythema gently on the wound but there is no ascending cellulitis and is very minimal. There is no increase in warmth. There is no probing to bone, undermining or tunneling. There is no areas of fluctuance, crepitus, malodor.  Evidence of multiple digital amputations present on the left foot. No other open lesions or pre-ulcerative lesions. No pain with calf compression, swelling, warmth, erythema  Assessment: Large ulceration left foot, concerns for osteomyelitis.   Plan: -All treatment options discussed with the patient including all alternatives, risks, complications.  -Previous ABI in the office which was read as  "PAD". Has follow up with Dr. Fletcher Anon tomorrow apparently.  -MRI results were discussed the patient which was concern for osteomyelitis. I discussed with him treatment options for this. At this point we'll go ahead and put infectious disease consult. Also I recommended wound debridement, bone biopsy however allow to get his circulation checked before proceeding with any aggressive debridement. Will continue antibiotic. We'll continue Bactrim and add ciprofloxacin  -Continue collagen/silver dressing. He is in a cam boot which she continues with. I had a discussion with the [place] nonweightbearing. He has a knee scooter at home he states that he did not be nonweightbearing. I discussed with him that it's part of the issue with the wound not healing is the pressure to the area. Concerned about putting him into a cast is semi-noncompliant with weightbearing and I do not want any other other issues.  -Patient seems frustrated with having to seen multiple specialists. Discussed with him a team approach to help get this wound to heal. He states he is focused on getting a kidney transplant however he will not get the get in kidney transplant with intact infection we need to get this wound healed.  -Will recheck blood work including ESR, CRP, CBC.  -Monitor for any clinical signs or symptoms of infection and directed to call the office immediately should any occur or go to the ER. -RTC 1 week  Celesta Gentile, DPM

## 2016-08-06 ENCOUNTER — Ambulatory Visit (HOSPITAL_COMMUNITY)
Admission: RE | Admit: 2016-08-06 | Discharge: 2016-08-06 | Disposition: A | Discharge status: Home / Self Care | Payer: Medicare HMO | Source: Ambulatory Visit | Attending: Cardiovascular Disease | Admitting: Cardiovascular Disease

## 2016-08-06 ENCOUNTER — Ambulatory Visit (HOSPITAL_COMMUNITY): Payer: Self-pay

## 2016-08-06 DIAGNOSIS — I70203 Unspecified atherosclerosis of native arteries of extremities, bilateral legs: Secondary | ICD-10-CM | POA: Diagnosis not present

## 2016-08-06 DIAGNOSIS — Z87891 Personal history of nicotine dependence: Secondary | ICD-10-CM | POA: Diagnosis not present

## 2016-08-06 DIAGNOSIS — Z89412 Acquired absence of left great toe: Secondary | ICD-10-CM | POA: Diagnosis not present

## 2016-08-06 DIAGNOSIS — I10 Essential (primary) hypertension: Secondary | ICD-10-CM | POA: Insufficient documentation

## 2016-08-06 DIAGNOSIS — E1151 Type 2 diabetes mellitus with diabetic peripheral angiopathy without gangrene: Secondary | ICD-10-CM | POA: Diagnosis not present

## 2016-08-06 DIAGNOSIS — Z89411 Acquired absence of right great toe: Secondary | ICD-10-CM | POA: Diagnosis not present

## 2016-08-06 DIAGNOSIS — E785 Hyperlipidemia, unspecified: Secondary | ICD-10-CM | POA: Diagnosis not present

## 2016-08-06 DIAGNOSIS — E11621 Type 2 diabetes mellitus with foot ulcer: Secondary | ICD-10-CM | POA: Diagnosis not present

## 2016-08-06 DIAGNOSIS — L97529 Non-pressure chronic ulcer of other part of left foot with unspecified severity: Secondary | ICD-10-CM | POA: Insufficient documentation

## 2016-08-06 DIAGNOSIS — I700 Atherosclerosis of aorta: Secondary | ICD-10-CM | POA: Insufficient documentation

## 2016-08-06 DIAGNOSIS — I739 Peripheral vascular disease, unspecified: Secondary | ICD-10-CM | POA: Diagnosis not present

## 2016-08-06 NOTE — Telephone Encounter (Addendum)
-----   Message from Trula Slade, DPM sent at 08/05/2016  5:41 PM EDT ----- Can you please put in for a infectious disease consult due to osteomyelitis of the left foot? Thanks. 08/06/2016-Faxed required form, clinicals and demographics to Infectious Disease.

## 2016-08-06 NOTE — Telephone Encounter (Signed)
Then yes, do one tablet daily

## 2016-08-06 NOTE — Telephone Encounter (Signed)
I spoke with Matthew Kline [phone] and asked if pt in End Stage IV Kidney Disease could take Bactrim DS bid and he stated pt should only take 1 time daily.

## 2016-08-09 ENCOUNTER — Ambulatory Visit (HOSPITAL_COMMUNITY): Payer: Self-pay

## 2016-08-11 ENCOUNTER — Ambulatory Visit (HOSPITAL_COMMUNITY): Payer: Self-pay

## 2016-08-12 ENCOUNTER — Encounter: Payer: Self-pay | Admitting: Podiatry

## 2016-08-12 ENCOUNTER — Ambulatory Visit (INDEPENDENT_AMBULATORY_CARE_PROVIDER_SITE_OTHER): Payer: Medicare HMO | Admitting: Podiatry

## 2016-08-12 ENCOUNTER — Ambulatory Visit (INDEPENDENT_AMBULATORY_CARE_PROVIDER_SITE_OTHER): Payer: Medicare HMO

## 2016-08-12 VITALS — BP 163/61 | HR 72 | Temp 96.4°F

## 2016-08-12 DIAGNOSIS — L97422 Non-pressure chronic ulcer of left heel and midfoot with fat layer exposed: Secondary | ICD-10-CM | POA: Diagnosis not present

## 2016-08-12 DIAGNOSIS — M869 Osteomyelitis, unspecified: Secondary | ICD-10-CM | POA: Diagnosis not present

## 2016-08-12 MED ORDER — SULFAMETHOXAZOLE-TRIMETHOPRIM 800-160 MG PO TABS: 1.0000 | ORAL_TABLET | Freq: Every day | ORAL | 0 refills | Status: DC

## 2016-08-12 MED ORDER — CIPROFLOXACIN HCL 500 MG PO TABS: 500.0000 mg | ORAL_TABLET | Freq: Every day | ORAL | 0 refills | Status: DC

## 2016-08-13 ENCOUNTER — Ambulatory Visit (HOSPITAL_COMMUNITY): Payer: Self-pay

## 2016-08-13 DIAGNOSIS — L97422 Non-pressure chronic ulcer of left heel and midfoot with fat layer exposed: Secondary | ICD-10-CM | POA: Insufficient documentation

## 2016-08-13 NOTE — Progress Notes (Signed)
Subjective: 56 year old male presents the office today for follow-up evaluation of wound to the left foot. He states that he has not been wearing the cam boot Milus Banister first-time his been wearing this he is been wearing a regular croc-type shoe. He feels that the wound is improving and feels that there is "skin coming over it". He states he still gets some bloody to clear drainage but denies any pus. He denies any surrounding redness. He states that his foot is more swollen that has been as he is currently in the process of moving as he is going through a separation. He reports a fever on Tuesday at 100.6. Since that he has had no fever. Denies any systemic complaints such as fevers, chills, nausea, vomiting. No acute changes since last appointment, and no other complaints at this time.   Objective: AAO x3, NAD DP/PT pulses decreased , CRT less than 3 seconds On the plantar aspect the left foot there is a very large ulceration measuring a total of 8 x 5 cm with a depth of approximately 0.4 cm. overall wound appears to be somewhat improved. Today there is no tendon exposure or bone exposure. Upon debridement area was done to healthy, granular tissue. The wound until lateral aspect of the foot has been appearing more fibrotic but states years be more granular. There is a faint amount of erythema gently on the wound but this is more likely from inflammation as opposed to infection. There is no ascending cellulitis. There is no warmth to the foot. Mild swelling to the foot. No other open lesions or pre-ulcerative lesions. No pain with calf compression, swelling, warmth, erythema  Assessment: Large ulceration left foot, concerns for osteomyelitis.   Plan: -All treatment options discussed with the patient including all alternatives, risks, complications. -Repeat x-rays were obtained today. Evidence of Charcot deformity. There is no definitive evidence of acute osteomyelitis identified at this time and there  is no soft tissue emphysema. -Medically necessary sharp debridement was carried out today debrided the wound to healthy, granular tissue. -Due to an ongoing ulceration with some although minimal improvement, still recommend a follow-up with Dr. Fletcher Anon next week in regards to circulation.  -Continue antibiotic. -Strongly encourage nonweightbearing as well as using the cam boot. He has been walking in a regular shoe. He states that he cannot be nonweightbearing and is currently living in a hotel.  -Recommended wound care follow-up. Referral was placed today as well. Given MRI was concerning for possible osteomyelitis he may be a candidate hyperbaric oxygen therapy. Marylou Mccoy, RN will place the referral  -He is afebrile today to continue to monitor temperature closely. -Continue daily dressing changes -Monitor for any clinical signs or symptoms of infection and directed to call the office immediately should any occur or go to the ER. -RTC 1 week or sooner if needed.    Celesta Gentile, DPM

## 2016-08-16 ENCOUNTER — Ambulatory Visit (HOSPITAL_COMMUNITY): Payer: Self-pay

## 2016-08-17 ENCOUNTER — Ambulatory Visit: Payer: Self-pay | Admitting: Infectious Diseases

## 2016-08-17 ENCOUNTER — Encounter: Payer: Self-pay | Admitting: Cardiovascular Disease

## 2016-08-17 ENCOUNTER — Ambulatory Visit (INDEPENDENT_AMBULATORY_CARE_PROVIDER_SITE_OTHER): Payer: Medicare HMO | Admitting: Cardiovascular Disease

## 2016-08-17 VITALS — BP 157/83 | HR 82 | Ht 71.0 in | Wt 199.2 lb

## 2016-08-17 DIAGNOSIS — I2510 Atherosclerotic heart disease of native coronary artery without angina pectoris: Secondary | ICD-10-CM | POA: Diagnosis not present

## 2016-08-17 DIAGNOSIS — Z720 Tobacco use: Secondary | ICD-10-CM | POA: Diagnosis not present

## 2016-08-17 DIAGNOSIS — I739 Peripheral vascular disease, unspecified: Secondary | ICD-10-CM

## 2016-08-17 DIAGNOSIS — Z01818 Encounter for other preprocedural examination: Secondary | ICD-10-CM

## 2016-08-17 DIAGNOSIS — I10 Essential (primary) hypertension: Secondary | ICD-10-CM

## 2016-08-17 NOTE — Progress Notes (Signed)
Cardiology Office Note   Date:  08/19/2016   ID:  Matthew Kline, DOB 1960/12/19, MRN [id number]  PCP:  Donald Prose, MD  Cardiologist:  [facility] (   Chief Complaint  Patient presents with  . New Patient (Initial Visit)    Pt states no Sx.       History of Present Illness: Matthew Kline is a 56 y.o. male who was referred by [name] for evaluation and management of peripheral arterial disease. He has known history of coronary artery disease status post CABG in May 2018, type 2 diabetes, end-stage renal disease on hemodialysis, essential hypertension, CVA, tobacco use and peripheral neuropathy. The patient had morbid obesity in the past but lost significant amount of weight with improving lifestyle. He is on dialysis Monday Wednesday and Friday and his nephrologist is Dr. Lorrene Reid. He continues to smoke one quarter pack per day but is trying to quit. He presents with large ulceration of the plantar aspect of the left foot with concerns for osteomylitis. He had previous multiple digit amputations. The ulceration started shortly after CABG in May when he was trying to do physical therapy in the setting of Charcot's foot. The wound got larger since then and most recently measured 8 x 5 cm in spite of wound care. He had left knee replacement done 3 times before. He underwent recent noninvasive vascular evaluation for which showed normal ABI bilaterally. Duplex showed moderate bilateral SFA disease with borderline significant disease in the left popliteal artery with a peak velocity of 300. He was noted to have one-vessel runoff bilaterally. Only the peroneal artery was patent on the left side.  Past Medical History:  Diagnosis Date  . Allergy   . Anemia, unspecified   . Anxiety   . Arthralgia 2010   polyarticular  . Arthritis    "back, knees" (05/09/2014)  . CHF (congestive heart failure) ([place])   . CHF (congestive heart failure) ([place]) 07/25/2009   denies  . Chronic lower back pain     . Coronary artery disease   . Coughing    pt. reports that he has drainage from sinus infection  . Diabetic neuropathy ([place])   . ESRD (end stage renal disease) on dialysis [facility])    started 12/2012; "MWF; [facility] "   . GERD (gastroesophageal reflux disease)    hx "before I lost weight", no problem 9 years  . Hemodialysis access site with mature fistula ([place])   . Hemorrhoids, internal 10/2011   small  . History of blood transfusion    "related to the anemia"  . Hypertension   . Insomnia, unspecified   . Knee pain, left   . 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 Long term (current) use of anticoagulants   . Myocardial infarction ([place]) 1995  . Orthostatic hypotension   . Osteomyelitis of foot, left, acute ([place])   . Other chronic postoperative pain   . Pneumonia    "probably 4-5 times" (05/09/2014)  . Polymyalgia rheumatica ([place])   . Renal insufficiency   . Sleep apnea    "lost weight; no more problem" (05/09/2014)  . Stroke ([place]) 01/10/06   denies residual on 05/09/2014  . Type II diabetes mellitus ([place]) dx'd 1995  . Ulcer    diabetic foot   . Unspecified hereditary and idiopathic peripheral neuropathy    feet  . Unspecified osteomyelitis, site unspecified   . Unspecified vitamin D deficiency     Past Surgical History:  Procedure Laterality Date  . AMPUTATION  01/21/2012   Procedure: AMPUTATION RAY;  Surgeon: [name], MD;  Location: [place];  Service: Orthopedics;  Laterality: Left;  Left Foot 4th Ray Amputation  . AMPUTATION Left 05/04/2013   Procedure: AMPUTATION DIGIT;  Surgeon: [name], MD;  Location: [place];  Service: Orthopedics;  Laterality: Left;  Left Great Toe Amputation at [facility]  . ANTERIOR CERVICAL DECOMP/DISCECTOMY FUSION  02/2011  . BASCILIC VEIN TRANSPOSITION Left 10/19/2012   Procedure: BASCILIC VEIN TRANSPOSITION;  Surgeon: Serafina Mitchell, MD;  Location: [place];  Service: Vascular;  Laterality: Left;  . CORONARY ARTERY BYPASS GRAFT      x 5 with lima at [place] WITH ANTIBIOTIC SPACERS Left 08/07/2014   Procedure: Replace Left Total Knee Arthroplasty,  Place Antibiotic Spacer;  Surgeon: [name], MD;  Location: [place];  Service: Orthopedics;  Laterality: Left;  . I&D EXTREMITY Left 05/09/2014   Procedure: Irrigation and Debridement Left Knee and Closure of Total Knee Arthroplasty Incision;  Surgeon: [name], MD;  Location: [place];  Service: Orthopedics;  Laterality: Left;  . I&D KNEE WITH POLY EXCHANGE Left 05/31/2014   Procedure: IRRIGATION AND DEBRIDEMENT LEFT KNEE, PLACE ANTIBIOTIC BEADS,  POLY EXCHANGE;  Surgeon: [name], MD;  Location: [place];  Service: Orthopedics;  Laterality: Left;  . KNEE ARTHROSCOPY Left 08-25-2012  . REFRACTIVE SURGERY Bilateral   . TOE AMPUTATION Bilateral    "I've lost 7 toes over the last 7 years" (05/09/2014)  . TOE SURGERY Left April 2015   Big toe removed on left foot.  . TONSILLECTOMY    . TOTAL KNEE ARTHROPLASTY Left 04/10/2014   Procedure: TOTAL KNEE ARTHROPLASTY;  Surgeon: [name], MD;  Location: [place];  Service: Orthopedics;  Laterality: Left;  . TOTAL KNEE REVISION Left 10/25/2014   Procedure: LEFT TOTAL KNEE REVISION;  Surgeon: [name], MD;  Location: [place];  Service: Orthopedics;  Laterality: Left;  . TOTAL KNEE REVISION Left 11/26/2015   Procedure: Removal Left Total Knee Arthroplasty, Hinged Total Knee Arthroplasty;  Surgeon: [name], MD;  Location: [place];  Service: Orthopedics;  Laterality: Left;  . UVULOPALATOPHARYNGOPLASTY, TONSILLECTOMY AND SEPTOPLASTY  ~ 1989  . WOUND DEBRIDEMENT Left 05/09/2014   Dehiscence Left Total Knee Arthroplasty Incision     Current Outpatient Prescriptions  Medication Sig Dispense Refill  . albuterol (PROVENTIL HFA;VENTOLIN HFA) 108 (90 Base) MCG/ACT inhaler Inhale 2 puffs into the lungs every 6 (six) hours as needed for wheezing or shortness of breath. 1 Inhaler 6  .  amLODipine (NORVASC) 10 MG tablet Take 10 mg by mouth daily.    Marland Kitchen aspirin 81 MG EC tablet TAKE 1 TABLET EVERY DAY (Patient taking differently: TAKE 2 TABLETs EVERY DAY) 30 tablet 6  . atorvastatin (LIPITOR) 10 MG tablet Take one tablet by mouth once daily at 6pm for cholesterol. **Needs an appointment before anymore refills. (Patient taking differently: 40 mg. Take one tablet by mouth once daily at 6pm for cholesterol. **Needs an appointment before anymore refills.) 30 tablet 0  . calcitRIOL (ROCALTROL) 1 MCG/ML solution Take 1 mcg by mouth every Monday, Wednesday, and Friday with hemodialysis. Calcitriol IV Injection active form of vitamin D.    . carvedilol (COREG) 25 MG tablet Take 25 mg by mouth 2 (two) times daily with a meal.    . ciprofloxacin (CIPRO) 500 MG tablet Take 1 tablet (500 mg total)  by mouth daily. 20 tablet 0  . collagenase (SANTYL) ointment Apply 1 application topically daily. 90 g 2  . fluticasone (FLONASE) 50 MCG/ACT nasal spray Place 2 sprays into both nostrils daily. (Patient taking differently: Place 2 sprays into both nostrils daily as needed. ) 16 g 6  . gabapentin (NEURONTIN) 300 MG capsule TAKE ONE CAPSULE BY MOUTH 3 TIMES A DAY (Patient taking differently: TAKE ONE CAPSULE BY MOUTH daily) 270 capsule 0  . montelukast (SINGULAIR) 10 MG tablet TAKE 1 TABLET (10 MG TOTAL) BY MOUTH AT BEDTIME. 90 tablet 1  . multivitamin (RENA-VIT) TABS tablet Take 1 tablet by mouth daily.    Marland Kitchen oxyCODONE-acetaminophen (PERCOCET/ROXICET) 5-325 MG tablet Take 1 tablet by mouth every 4 (four) hours as needed for severe pain. 60 tablet 0  . sevelamer carbonate (RENVELA) 800 MG tablet Take 800-3,200 mg by mouth 3 (three) times daily with meals. 800 mg-1600 mg with snacks (depends on dietary intake) and 2400 mg-3200 mg with meals    . sorbitol 70 % solution Take 30-60 mLs by mouth daily as needed (for constipation).   5  . sulfamethoxazole-trimethoprim (BACTRIM DS,SEPTRA DS) 800-160 MG tablet Take 1  tablet by mouth 2 (two) times daily. 20 tablet 0  . sulfamethoxazole-trimethoprim (BACTRIM DS,SEPTRA DS) 800-160 MG tablet Take 1 tablet by mouth daily. 10 tablet 0  . temazepam (RESTORIL) 30 MG capsule Take 30 mg by mouth at bedtime as needed. for sleep  2   No current facility-administered medications for this visit.     Allergies:   Morphine and related; Tygacil [tigecycline]; and Morphine    Social History:  The patient  reports that he quit smoking about 2 months ago. His smoking use included Cigarettes. He has a 3.84 pack-year smoking history. He quit smokeless tobacco use about 22 months ago. He reports that he does not drink alcohol or use drugs.   Family History:  The patient's family history includes Cancer (age of onset: 72) in his mother; Heart disease in his maternal aunt; Hypertension in his mother; Stroke in his maternal grandfather.    ROS:  Please see the history of present illness.   Otherwise, review of systems are positive for none.   All other systems are reviewed and negative.    PHYSICAL EXAM: VS:  BP (!) 157/83   Pulse 82   Ht 5\' 11"  (1.803 m)   Wt 199 lb 3.2 oz (90.4 kg)   BMI 27.78 kg/m  , BMI Body mass index is 27.78 kg/m. GEN: Well nourished, well developed, in no acute distress  HEENT: normal  Neck: no JVD, carotid bruits, or masses Cardiac: RRR; no murmurs, rubs, or gallops,no edema  Respiratory:  clear to auscultation bilaterally, normal work of breathing GI: soft, nontender, nondistended, + BS MS: no deformity or atrophy  Skin: warm and dry, no rash Neuro:  Strength and sensation are intact Psych: euthymic mood, full affect Vascular: Radial pulses was 2 on the right and +1 on the left, femoral pulse: +2 bilaterally. Distal pulses are not palpable. He has a left arm fistula.  EKG:  EKG is ordered today. The ekg ordered today demonstrates normal sinus rhythm with lateral T wave changes suggestive of ischemia.   Recent Labs: 08/17/2016: BUN 33;  Creatinine, Ser 5.67; Hemoglobin 10.4; Platelets 181; Potassium 6.3; Sodium 141    Lipid Panel    Component Value Date/Time   CHOL 180 07/06/2015 0124   CHOL 228 (H) 09/15/2012 1030   TRIG 118 07/06/2015  0124   HDL 44 07/06/2015 0124   HDL 40 09/15/2012 1030   CHOLHDL 4.1 07/06/2015 0124   VLDL 24 07/06/2015 0124   LDLCALC 112 (H) 07/06/2015 0124   LDLCALC 113 (H) 09/15/2012 1030      Wt Readings from Last 3 Encounters:  08/17/16 199 lb 3.2 oz (90.4 kg)  07/14/16 191 lb 12.8 oz (87 kg)  06/24/16 199 lb 11.8 oz (90.6 kg)       PAD Screen 08/17/2016  Previous PAD dx? No  Previous surgical procedure? Yes  Pain with walking? Yes  Subsides with rest? Yes  Feet/toe relief with dangling? No  Painful, non-healing ulcers? Yes  Extremities discolored? No      ASSESSMENT AND PLAN:  1.  Peripheral arterial disease with nonhealing wound on the left foot: In spite of normal ABI, the patient seems to have significant tibial disease with his only runoff being the peroneal artery. This might not be optimal for wound healing as it has to be in line flow to the posterior tibial artery distribution. I discussed the findings with the patient and recommended proceeding with abdominal aortogram, lower extremity runoff and possible endovascular intervention. I discussed the procedure in detail as well as risk and benefits. The patient wants to avoid prolonged dual antiplatelet therapy in order to be on a kidney transplant list. Obviously, the wound has to heal before this can be considered.  2. Coronary artery disease involving native coronary arteries without angina: Into new medical therapy.  3. Hyperlipidemia: Continue treatment with atorvastatin with a target LDL of less than 70.  4. Essential hypertension: Blood pressure is reasonably controlled.  5. Tobacco use: I discussed with him the importance of smoking cessation especially in the setting of peripheral arterial disease.  6.  End-stage renal disease on hemodialysis: Monday Wednesday and Friday. He will require dialysis after his angiogram.  Disposition:   FU with me in 1 month  Signed,  Kathlyn [place], MD  08/19/2016 7:21 AM    [place]

## 2016-08-17 NOTE — Patient Instructions (Signed)
    [facility] [address] [place] [address] Dept: [phone] Loc: [phone]  Natividad Halls  08/17/2016  You are scheduled for a Peripheral Angiogram on Wednesday, August 15 with Dr. Kathlyn Sacramento.  1. Please arrive at the [facility] (Main Entrance A) at [facility]: [address] [place], [place] [address] at 6:30 AM (two hours before your procedure to ensure your preparation). Free valet parking service is available.   Special note: Every effort is made to have your procedure done on time. Please understand that emergencies sometimes delay scheduled procedures.  2. Diet: Do not eat or drink anything after midnight prior to your procedure except sips of water to take medications.  3. Labs: TODAY --- BMP, CBC, PT/INR  4. Medication instructions in preparation for your procedure On the morning of your procedure, take your Aspirin 81 MG  and any morning medicines NOT listed above.  You may use sips of water.  5. Plan for one night stay--bring personal belongings. 6. Bring a current list of your medications and current insurance cards. 7. You MUST have a responsible person to drive you home. 8. Someone MUST be with you the first 24 hours after you arrive home or your discharge will be delayed. 9. Please wear clothes that are easy to get on and off and wear slip-on shoes.  Thank you for allowing [place] to care for you!   -- [facility]    Your physician recommends that you schedule a follow-up appointment in 2 weeks with DR ARIDA.

## 2016-08-18 ENCOUNTER — Ambulatory Visit (HOSPITAL_COMMUNITY): Payer: Self-pay

## 2016-08-18 LAB — BASIC METABOLIC PANEL
BUN/Creatinine Ratio: 6 — ABNORMAL LOW (ref 9–20)
BUN: 33 mg/dL — ABNORMAL HIGH (ref 6–24)
CHLORIDE: 90 mmol/L — AB (ref 96–106)
CO2: 31 mmol/L — AB (ref 20–29)
CREATININE: 5.67 mg/dL — AB (ref 0.76–1.27)
Calcium: 10 mg/dL (ref 8.7–10.2)
GFR calc Af Amer: 12 mL/min/{1.73_m2} — ABNORMAL LOW (ref >59)
GFR calc non Af Amer: 10 mL/min/{1.73_m2} — ABNORMAL LOW (ref >59)
Glucose: 160 mg/dL — ABNORMAL HIGH (ref 65–99)
Potassium: 6.3 mmol/L — ABNORMAL HIGH (ref 3.5–5.2)
Sodium: 141 mmol/L (ref 134–144)

## 2016-08-18 LAB — CBC
Hematocrit: 35.1 % — ABNORMAL LOW (ref 37.5–51.0)
Hemoglobin: 10.4 g/dL — ABNORMAL LOW (ref 13.0–17.7)
MCH: 26.5 pg — ABNORMAL LOW (ref 26.6–33.0)
MCHC: 29.6 g/dL — AB (ref 31.5–35.7)
MCV: 89 fL (ref 79–97)
Platelets: 181 10*3/uL (ref 150–379)
RBC: 3.93 x10E6/uL — ABNORMAL LOW (ref 4.14–5.80)
RDW: 18.7 % — AB (ref 12.3–15.4)
WBC: 6.1 10*3/uL (ref 3.4–10.8)

## 2016-08-18 LAB — PROTIME-INR
INR: 1.2 (ref 0.8–1.2)
Prothrombin Time: 12.2 s — ABNORMAL HIGH (ref 9.1–12.0)

## 2016-08-19 ENCOUNTER — Encounter (HOSPITAL_BASED_OUTPATIENT_CLINIC_OR_DEPARTMENT_OTHER): Payer: Medicare HMO | Attending: Internal Medicine

## 2016-08-19 DIAGNOSIS — E11610 Type 2 diabetes mellitus with diabetic neuropathic arthropathy: Secondary | ICD-10-CM | POA: Diagnosis not present

## 2016-08-19 DIAGNOSIS — Z89422 Acquired absence of other left toe(s): Secondary | ICD-10-CM | POA: Insufficient documentation

## 2016-08-19 DIAGNOSIS — I2510 Atherosclerotic heart disease of native coronary artery without angina pectoris: Secondary | ICD-10-CM | POA: Diagnosis not present

## 2016-08-19 DIAGNOSIS — Z8673 Personal history of transient ischemic attack (TIA), and cerebral infarction without residual deficits: Secondary | ICD-10-CM | POA: Insufficient documentation

## 2016-08-19 DIAGNOSIS — L97422 Non-pressure chronic ulcer of left heel and midfoot with fat layer exposed: Secondary | ICD-10-CM | POA: Diagnosis not present

## 2016-08-19 DIAGNOSIS — Z992 Dependence on renal dialysis: Secondary | ICD-10-CM | POA: Insufficient documentation

## 2016-08-19 DIAGNOSIS — F17210 Nicotine dependence, cigarettes, uncomplicated: Secondary | ICD-10-CM | POA: Diagnosis not present

## 2016-08-19 DIAGNOSIS — N186 End stage renal disease: Secondary | ICD-10-CM | POA: Insufficient documentation

## 2016-08-19 DIAGNOSIS — I252 Old myocardial infarction: Secondary | ICD-10-CM | POA: Diagnosis not present

## 2016-08-19 DIAGNOSIS — I129 Hypertensive chronic kidney disease with stage 1 through stage 4 chronic kidney disease, or unspecified chronic kidney disease: Secondary | ICD-10-CM | POA: Diagnosis not present

## 2016-08-19 DIAGNOSIS — Z79891 Long term (current) use of opiate analgesic: Secondary | ICD-10-CM | POA: Diagnosis not present

## 2016-08-19 DIAGNOSIS — Z951 Presence of aortocoronary bypass graft: Secondary | ICD-10-CM | POA: Diagnosis not present

## 2016-08-19 DIAGNOSIS — Z89411 Acquired absence of right great toe: Secondary | ICD-10-CM | POA: Diagnosis not present

## 2016-08-19 DIAGNOSIS — Z7951 Long term (current) use of inhaled steroids: Secondary | ICD-10-CM | POA: Insufficient documentation

## 2016-08-19 DIAGNOSIS — E785 Hyperlipidemia, unspecified: Secondary | ICD-10-CM | POA: Diagnosis not present

## 2016-08-19 DIAGNOSIS — E1140 Type 2 diabetes mellitus with diabetic neuropathy, unspecified: Secondary | ICD-10-CM | POA: Diagnosis not present

## 2016-08-19 DIAGNOSIS — E11621 Type 2 diabetes mellitus with foot ulcer: Secondary | ICD-10-CM | POA: Diagnosis not present

## 2016-08-19 DIAGNOSIS — Z8614 Personal history of Methicillin resistant Staphylococcus aureus infection: Secondary | ICD-10-CM | POA: Insufficient documentation

## 2016-08-19 DIAGNOSIS — Z79899 Other long term (current) drug therapy: Secondary | ICD-10-CM | POA: Insufficient documentation

## 2016-08-19 DIAGNOSIS — E1151 Type 2 diabetes mellitus with diabetic peripheral angiopathy without gangrene: Secondary | ICD-10-CM | POA: Insufficient documentation

## 2016-08-19 DIAGNOSIS — Z96659 Presence of unspecified artificial knee joint: Secondary | ICD-10-CM | POA: Insufficient documentation

## 2016-08-19 DIAGNOSIS — E1122 Type 2 diabetes mellitus with diabetic chronic kidney disease: Secondary | ICD-10-CM | POA: Insufficient documentation

## 2016-08-19 DIAGNOSIS — Z7982 Long term (current) use of aspirin: Secondary | ICD-10-CM | POA: Insufficient documentation

## 2016-08-20 ENCOUNTER — Telehealth (INDEPENDENT_AMBULATORY_CARE_PROVIDER_SITE_OTHER): Payer: Self-pay | Admitting: Orthopedic Surgery

## 2016-08-20 ENCOUNTER — Ambulatory Visit: Payer: Medicare HMO | Admitting: Podiatry

## 2016-08-20 ENCOUNTER — Ambulatory Visit (HOSPITAL_COMMUNITY): Payer: Self-pay

## 2016-08-20 NOTE — Telephone Encounter (Signed)
I received VM from Legrand Como @ [facility] office asking for DOS 04/17/2015 be faxed. I called him back [phone] and LMVM informing that patient was not seen that day.

## 2016-08-23 ENCOUNTER — Ambulatory Visit (INDEPENDENT_AMBULATORY_CARE_PROVIDER_SITE_OTHER): Payer: Medicare HMO | Admitting: Podiatry

## 2016-08-23 ENCOUNTER — Ambulatory Visit (HOSPITAL_COMMUNITY): Payer: Self-pay

## 2016-08-23 ENCOUNTER — Encounter: Payer: Self-pay | Admitting: Podiatry

## 2016-08-23 DIAGNOSIS — L97422 Non-pressure chronic ulcer of left heel and midfoot with fat layer exposed: Secondary | ICD-10-CM

## 2016-08-23 DIAGNOSIS — M869 Osteomyelitis, unspecified: Secondary | ICD-10-CM

## 2016-08-24 ENCOUNTER — Telehealth: Payer: Self-pay | Admitting: Podiatry

## 2016-08-24 NOTE — Telephone Encounter (Signed)
Hello, this is Anderson Malta with the [facility] calling. You referred pt to our office and his insurance is requiring serial measurements of his wounds within the past 30 days. They need this to approve him for the hyperbaric chamber. We have only seen him once and have measurements where we saw him but I  was hoping your office had measured him. If so, could you please fax those to my attention at [phone]. If you have any questions or we need to talk about it, you can call me at [phone]. Thank you.

## 2016-08-25 ENCOUNTER — Inpatient Hospital Stay (HOSPITAL_COMMUNITY)
Admission: AD | Disposition: A | Discharge status: Home / Self Care | Payer: Self-pay | Source: Ambulatory Visit | Attending: Cardiovascular Disease

## 2016-08-25 ENCOUNTER — Inpatient Hospital Stay (HOSPITAL_COMMUNITY)
Admission: AD | Admit: 2016-08-25 | Discharge: 2016-08-25 | DRG: 252 | Disposition: A | Discharge status: Home / Self Care | Payer: Medicare Other | Source: Ambulatory Visit | Attending: Cardiovascular Disease | Admitting: Cardiovascular Disease

## 2016-08-25 ENCOUNTER — Telehealth: Payer: Self-pay | Admitting: *Deleted

## 2016-08-25 ENCOUNTER — Ambulatory Visit (HOSPITAL_COMMUNITY): Payer: Self-pay

## 2016-08-25 DIAGNOSIS — E1122 Type 2 diabetes mellitus with diabetic chronic kidney disease: Secondary | ICD-10-CM | POA: Diagnosis not present

## 2016-08-25 DIAGNOSIS — M353 Polymyalgia rheumatica: Secondary | ICD-10-CM | POA: Diagnosis not present

## 2016-08-25 DIAGNOSIS — E1151 Type 2 diabetes mellitus with diabetic peripheral angiopathy without gangrene: Secondary | ICD-10-CM | POA: Diagnosis not present

## 2016-08-25 DIAGNOSIS — Z981 Arthrodesis status: Secondary | ICD-10-CM

## 2016-08-25 DIAGNOSIS — I2510 Atherosclerotic heart disease of native coronary artery without angina pectoris: Secondary | ICD-10-CM | POA: Diagnosis present

## 2016-08-25 DIAGNOSIS — N186 End stage renal disease: Secondary | ICD-10-CM | POA: Diagnosis not present

## 2016-08-25 DIAGNOSIS — F419 Anxiety disorder, unspecified: Secondary | ICD-10-CM | POA: Diagnosis present

## 2016-08-25 DIAGNOSIS — E11610 Type 2 diabetes mellitus with diabetic neuropathic arthropathy: Secondary | ICD-10-CM | POA: Diagnosis present

## 2016-08-25 DIAGNOSIS — Z951 Presence of aortocoronary bypass graft: Secondary | ICD-10-CM | POA: Diagnosis not present

## 2016-08-25 DIAGNOSIS — F17210 Nicotine dependence, cigarettes, uncomplicated: Secondary | ICD-10-CM | POA: Diagnosis present

## 2016-08-25 DIAGNOSIS — I998 Other disorder of circulatory system: Secondary | ICD-10-CM | POA: Diagnosis not present

## 2016-08-25 DIAGNOSIS — I120 Hypertensive chronic kidney disease with stage 5 chronic kidney disease or end stage renal disease: Secondary | ICD-10-CM | POA: Diagnosis not present

## 2016-08-25 DIAGNOSIS — I70229 Atherosclerosis of native arteries of extremities with rest pain, unspecified extremity: Secondary | ICD-10-CM | POA: Diagnosis present

## 2016-08-25 DIAGNOSIS — I739 Peripheral vascular disease, unspecified: Secondary | ICD-10-CM

## 2016-08-25 DIAGNOSIS — Z992 Dependence on renal dialysis: Secondary | ICD-10-CM | POA: Diagnosis not present

## 2016-08-25 DIAGNOSIS — E785 Hyperlipidemia, unspecified: Secondary | ICD-10-CM | POA: Diagnosis present

## 2016-08-25 DIAGNOSIS — E11621 Type 2 diabetes mellitus with foot ulcer: Secondary | ICD-10-CM | POA: Diagnosis not present

## 2016-08-25 DIAGNOSIS — I70248 Atherosclerosis of native arteries of left leg with ulceration of other part of lower left leg: Secondary | ICD-10-CM | POA: Diagnosis not present

## 2016-08-25 DIAGNOSIS — L97529 Non-pressure chronic ulcer of other part of left foot with unspecified severity: Secondary | ICD-10-CM | POA: Diagnosis not present

## 2016-08-25 DIAGNOSIS — Z96652 Presence of left artificial knee joint: Secondary | ICD-10-CM | POA: Diagnosis not present

## 2016-08-25 DIAGNOSIS — Z79899 Other long term (current) drug therapy: Secondary | ICD-10-CM

## 2016-08-25 DIAGNOSIS — K219 Gastro-esophageal reflux disease without esophagitis: Secondary | ICD-10-CM | POA: Diagnosis not present

## 2016-08-25 DIAGNOSIS — Z8673 Personal history of transient ischemic attack (TIA), and cerebral infarction without residual deficits: Secondary | ICD-10-CM | POA: Diagnosis not present

## 2016-08-25 DIAGNOSIS — G4700 Insomnia, unspecified: Secondary | ICD-10-CM | POA: Diagnosis not present

## 2016-08-25 DIAGNOSIS — Z89432 Acquired absence of left foot: Secondary | ICD-10-CM

## 2016-08-25 DIAGNOSIS — I252 Old myocardial infarction: Secondary | ICD-10-CM | POA: Diagnosis not present

## 2016-08-25 DIAGNOSIS — Z7982 Long term (current) use of aspirin: Secondary | ICD-10-CM

## 2016-08-25 HISTORY — PX: PERIPHERAL VASCULAR BALLOON ANGIOPLASTY: CATH118281

## 2016-08-25 HISTORY — PX: LOWER EXTREMITY ANGIOGRAPHY: CATH118251

## 2016-08-25 HISTORY — PX: ABDOMINAL AORTOGRAM: CATH118222

## 2016-08-25 LAB — POCT ACTIVATED CLOTTING TIME
ACTIVATED CLOTTING TIME: 136 s
ACTIVATED CLOTTING TIME: 142 s
Activated Clotting Time: 202 seconds
Activated Clotting Time: 213 seconds
Activated Clotting Time: 191 seconds
Activated Clotting Time: 175 seconds

## 2016-08-25 LAB — BASIC METABOLIC PANEL
ANION GAP: 15 (ref 5–15)
BUN: 62 mg/dL — ABNORMAL HIGH (ref 6–20)
CHLORIDE: 96 mmol/L — AB (ref 101–111)
CO2: 26 mmol/L (ref 22–32)
Calcium: 9.5 mg/dL (ref 8.9–10.3)
Creatinine, Ser: 7.54 mg/dL — ABNORMAL HIGH (ref 0.61–1.24)
GFR calc non Af Amer: 7 mL/min — ABNORMAL LOW (ref >60)
GFR, EST AFRICAN AMERICAN: 8 mL/min — AB (ref >60)
Glucose, Bld: 117 mg/dL — ABNORMAL HIGH (ref 65–99)
POTASSIUM: 6.1 mmol/L — AB (ref 3.5–5.1)
Sodium: 137 mmol/L (ref 135–145)

## 2016-08-25 LAB — GLUCOSE, CAPILLARY
GLUCOSE-CAPILLARY: 108 mg/dL — AB (ref 65–99)
Glucose-Capillary: 105 mg/dL — ABNORMAL HIGH (ref 65–99)

## 2016-08-25 SURGERY — ABDOMINAL AORTOGRAM
Anesthesia: LOCAL

## 2016-08-25 MED ORDER — LIDOCAINE HCL (PF) 1 % IJ SOLN
INTRAMUSCULAR | Status: DC | PRN
  Administered 2016-08-25: 22 mL

## 2016-08-25 MED ORDER — FENTANYL CITRATE (PF) 100 MCG/2ML IJ SOLN
INTRAMUSCULAR | Status: DC | PRN
  Administered 2016-08-25 (×3): 50 ug via INTRAVENOUS

## 2016-08-25 MED ORDER — IODIXANOL 320 MG/ML IV SOLN
INTRAVENOUS | Status: DC | PRN
  Administered 2016-08-25: 145 mL via INTRAVENOUS

## 2016-08-25 MED ORDER — MIDAZOLAM HCL 2 MG/2ML IJ SOLN
INTRAMUSCULAR | Status: DC | PRN
  Administered 2016-08-25 (×3): 1 mg via INTRAVENOUS

## 2016-08-25 MED ORDER — HEPARIN SODIUM (PORCINE) 1000 UNIT/ML IJ SOLN
INTRAMUSCULAR | Status: DC | PRN
  Administered 2016-08-25: 6000 [IU] via INTRAVENOUS
  Administered 2016-08-25: 2000 [IU] via INTRAVENOUS
  Administered 2016-08-25: 7000 [IU] via INTRAVENOUS

## 2016-08-25 MED ORDER — HEPARIN SODIUM (PORCINE) 1000 UNIT/ML IJ SOLN
INTRAMUSCULAR | Status: AC
  Filled 2016-08-25: qty 1

## 2016-08-25 MED ORDER — ASPIRIN 81 MG PO CHEW: 81.0000 mg | CHEWABLE_TABLET | ORAL | Status: DC

## 2016-08-25 MED ORDER — CLOPIDOGREL BISULFATE 300 MG PO TABS
ORAL_TABLET | ORAL | Status: AC
  Filled 2016-08-25: qty 2

## 2016-08-25 MED ORDER — SODIUM CHLORIDE 0.9% FLUSH: 3.0000 mL | Freq: Two times a day (BID) | INTRAVENOUS | Status: DC

## 2016-08-25 MED ORDER — MIDAZOLAM HCL 2 MG/2ML IJ SOLN
INTRAMUSCULAR | Status: AC
  Filled 2016-08-25: qty 2

## 2016-08-25 MED ORDER — SODIUM CHLORIDE 0.9 % IV SOLN
INTRAVENOUS | Status: DC
Start: 2016-08-26 — End: 2016-08-25

## 2016-08-25 MED ORDER — SODIUM CHLORIDE 0.9% FLUSH: 3.0000 mL | INTRAVENOUS | Status: DC | PRN

## 2016-08-25 MED ORDER — FENTANYL CITRATE (PF) 100 MCG/2ML IJ SOLN
INTRAMUSCULAR | Status: AC
  Filled 2016-08-25: qty 2

## 2016-08-25 MED ORDER — CLOPIDOGREL BISULFATE 75 MG PO TABS: 75.0000 mg | ORAL_TABLET | Freq: Every day | ORAL | 6 refills | Status: AC

## 2016-08-25 MED ORDER — HEPARIN (PORCINE) IN NACL 2-0.9 UNIT/ML-% IJ SOLN
INTRAMUSCULAR | Status: AC
  Filled 2016-08-25: qty 1000

## 2016-08-25 MED ORDER — LIDOCAINE HCL (PF) 1 % IJ SOLN
INTRAMUSCULAR | Status: AC
  Filled 2016-08-25: qty 30

## 2016-08-25 MED ORDER — HEPARIN (PORCINE) IN NACL 2-0.9 UNIT/ML-% IJ SOLN
INTRAMUSCULAR | Status: AC | PRN
  Administered 2016-08-25: 1000 mL

## 2016-08-25 MED ORDER — DOXERCALCIFEROL 4 MCG/2ML IV SOLN
3.0000 ug | INTRAVENOUS | Status: DC
  Administered 2016-08-25: 3 ug via INTRAVENOUS

## 2016-08-25 MED ORDER — SODIUM CHLORIDE 0.9 % IV SOLN: 250.0000 mL | INTRAVENOUS | Status: DC | PRN

## 2016-08-25 SURGICAL SUPPLY — 23 items
BALLN ANGIOSCULPT OTW 4X40 (BALLOONS) ×3
BALLN ARMADA 3.0X40X150 (BALLOONS) ×3
BALLOON ANGIOSCULPT OTW 4X40 (BALLOONS) ×2 IMPLANT
BALLOON ARMADA 3.0X40X150 (BALLOONS) ×2 IMPLANT
CATH ANGIO 5F PIGTAIL 65CM (CATHETERS) ×6 IMPLANT
CATH CROSS OVER TEMPO 5F (CATHETERS) ×3 IMPLANT
CATH CXI 2.3F 135 ANG (CATHETERS) ×3 IMPLANT
CATH STRAIGHT 5FR 65CM (CATHETERS) ×3 IMPLANT
DEVICE CONTINUOUS FLUSH (MISCELLANEOUS) ×3 IMPLANT
GUIDEWIRE STR TIP .014X300X8 (WIRE) ×3 IMPLANT
KIT ENCORE 26 ADVANTAGE (KITS) ×3 IMPLANT
KIT MICROINTRODUCER STIFF 5F (SHEATH) ×3 IMPLANT
KIT PV (KITS) ×3 IMPLANT
SHEATH PINNACLE 5F 10CM (SHEATH) ×3 IMPLANT
SHEATH PINNACLE ST 6F 45CM (SHEATH) ×3 IMPLANT
SHIELD RADPAD SCOOP 12X17 (MISCELLANEOUS) ×3 IMPLANT
STOPCOCK MORSE 400PSI 3WAY (MISCELLANEOUS) ×3 IMPLANT
SYRINGE MEDRAD AVANTA MACH 7 (SYRINGE) ×3 IMPLANT
TRANSDUCER W/STOPCOCK (MISCELLANEOUS) ×3 IMPLANT
TRAY PV CATH (CUSTOM PROCEDURE TRAY) ×3 IMPLANT
TUBING CIL FLEX 10 FLL-RA (TUBING) ×3 IMPLANT
WIRE HITORQ VERSACORE ST 145CM (WIRE) ×3 IMPLANT
WIRE RUNTHROUGH .014X300CM (WIRE) ×6 IMPLANT

## 2016-08-25 NOTE — Telephone Encounter (Addendum)
-----   Message from Wellington Hampshire, MD sent at 08/25/2016 11:46 AM EDT ----- S/p left anterior an peroneal artery angioplasty. Schedule a follow up LE arterial doppler/duplex before his follow up with me.   Spoke with pt, Follow up scheduled for LEA.

## 2016-08-25 NOTE — Interval H&P Note (Signed)
History and Physical Interval Note:  08/25/2016 9:28 AM  Matthew Kline  has presented today for surgery, with the diagnosis of pad  The various methods of treatment have been discussed with the patient and family. After consideration of risks, benefits and other options for treatment, the patient has consented to  Procedure(s): ABDOMINAL AORTOGRAM (N/A) as a surgical intervention .  The patient's history has been reviewed, patient examined, no change in status, stable for surgery.  I have reviewed the patient's chart and labs.  Questions were answered to the patient's satisfaction.     Kathlyn Sacramento

## 2016-08-25 NOTE — Progress Notes (Addendum)
Site area: RFA Site Prior to Removal:  Level 0 Pressure Applied For:20 min Manual:yes    Patient Status During Pull:  stable Post Pull Site:  Level 0 Post Pull Instructions Given:yes   Post Pull Pulses Present: doppler Dressing Applied:  tegaderm Bedrest begins @ 1345 till 1745 Comments:   

## 2016-08-25 NOTE — Progress Notes (Signed)
Subjective: 56 year old male presents the office today for follow-up evaluation of wound to the left foot. He states that he is doing well and he has had some minimal improvement in the wound. He does not change the dressing on a daily basis. He has scheduled an angiogram on Wednesday. He denies any pus or any significant drainage coming from the wound. Denies any swelling redness or red streaks. Denies any systemic complaints such as fevers, chills, nausea, vomiting. No acute changes since last appointment, and no other complaints at this time.   Objective: AAO x3, NAD DP/PT pulses decreased , CRT less than 3 seconds On the plantar aspect the left foot there is a very large ulceration measuring a total of 8 x 5 cm with a depth of approximately 0.4 cm. the wound does appear to be more superficial is no tendon or bone exposure. On the more plantar aspect of the wound this did have a smaller area that did probe deeper but is becoming more superficial and on the same surface level. Wound base is mostly green and there still some fibrotic tissue on the more lateral aspect of the wound. There is no surrounding erythema, ascending cellulitis. There is no fluctuance or crepitus. There is no malodor. No other open lesions or pre-ulcerative lesions identified today. No pain with calf compression, swelling, warmth, erythema      Assessment: Large ulceration left foot, concerns for osteomyelitis.   Plan: -All treatment options discussed with the patient including all alternatives, risks, complications. -Monitor was lightly debrided to remove the hyperkeratotic tissue around the periphery. Recommend he continue with daily dressing changes. He is been using calcium alginate to the wound which is recommended by the wound care center. -Continue to recommend nonweightbearing. He is able to do so. -Scheduled for angiogram on Wednesday. -Monitor for any clinical signs or symptoms of infection and directed to  call the office immediately should any occur or go to the ER. -RTC 1 week or sooner if needed.  Celesta Gentile, DPM

## 2016-08-25 NOTE — H&P (View-Only) (Signed)
Cardiology Office Note   Date:  08/19/2016   ID:  Matthew Kline, DOB 14-Apr-1960, MRN [id number]  PCP:  Donald Prose, MD  Cardiologist:  [facility] (   Chief Complaint  Patient presents with  . New Patient (Initial Visit)    Pt states no Sx.       History of Present Illness: Matthew Kline is a 56 y.o. male who was referred by [name] for evaluation and management of peripheral arterial disease. He has known history of coronary artery disease status post CABG in May 2018, type 2 diabetes, end-stage renal disease on hemodialysis, essential hypertension, CVA, tobacco use and peripheral neuropathy. The patient had morbid obesity in the past but lost significant amount of weight with improving lifestyle. He is on dialysis Monday Wednesday and Friday and his nephrologist is Dr. Lorrene Reid. He continues to smoke one quarter pack per day but is trying to quit. He presents with large ulceration of the plantar aspect of the left foot with concerns for osteomylitis. He had previous multiple digit amputations. The ulceration started shortly after CABG in May when he was trying to do physical therapy in the setting of Charcot's foot. The wound got larger since then and most recently measured 8 x 5 cm in spite of wound care. He had left knee replacement done 3 times before. He underwent recent noninvasive vascular evaluation for which showed normal ABI bilaterally. Duplex showed moderate bilateral SFA disease with borderline significant disease in the left popliteal artery with a peak velocity of 300. He was noted to have one-vessel runoff bilaterally. Only the peroneal artery was patent on the left side.  Past Medical History:  Diagnosis Date  . Allergy   . Anemia, unspecified   . Anxiety   . Arthralgia 2010   polyarticular  . Arthritis    "back, knees" (05/09/2014)  . CHF (congestive heart failure) ([place])   . CHF (congestive heart failure) ([place]) 07/25/2009   denies  . Chronic lower back pain     . Coronary artery disease   . Coughing    pt. reports that he has drainage from sinus infection  . Diabetic neuropathy ([place])   . ESRD (end stage renal disease) on dialysis [facility])    started 12/2012; "MWF; [place] "   . GERD (gastroesophageal reflux disease)    hx "before I lost weight", no problem 9 years  . Hemodialysis access site with mature fistula ([place])   . Hemorrhoids, internal 10/2011   small  . History of blood transfusion    "related to the anemia"  . Hypertension   . Insomnia, unspecified   . Knee pain, left   . Lacunar infarction ([place]) 2006   RUE/RLE, speech  . Long term (current) use of anticoagulants   . Myocardial infarction ([place]) 1995  . Orthostatic hypotension   . Osteomyelitis of foot, left, acute ([place])   . Other chronic postoperative pain   . Pneumonia    "probably 4-5 times" (05/09/2014)  . Polymyalgia rheumatica ([place])   . Renal insufficiency   . Sleep apnea    "lost weight; no more problem" (05/09/2014)  . Stroke ([place]) 01/10/06   denies residual on 05/09/2014  . Type II diabetes mellitus ([place]) dx'd 1995  . Ulcer    diabetic foot   . Unspecified hereditary and idiopathic peripheral neuropathy    feet  . Unspecified osteomyelitis, site unspecified   . Unspecified vitamin D deficiency     Past Surgical History:  Procedure Laterality Date  . AMPUTATION  01/21/2012   Procedure: AMPUTATION RAY;  Surgeon: [name], MD;  Location: [place];  Service: Orthopedics;  Laterality: Left;  Left Foot 4th Ray Amputation  . AMPUTATION Left 05/04/2013   Procedure: AMPUTATION DIGIT;  Surgeon: [name], MD;  Location: [place];  Service: Orthopedics;  Laterality: Left;  Left Great Toe Amputation at [facility]  . ANTERIOR CERVICAL DECOMP/DISCECTOMY FUSION  02/2011  . BASCILIC VEIN TRANSPOSITION Left 10/19/2012   Procedure: BASCILIC VEIN TRANSPOSITION;  Surgeon: Serafina Mitchell, MD;  Location: [place];  Service: Vascular;  Laterality: Left;  . CORONARY ARTERY BYPASS GRAFT      x 5 with lima at [place] WITH ANTIBIOTIC SPACERS Left 08/07/2014   Procedure: Replace Left Total Knee Arthroplasty,  Place Antibiotic Spacer;  Surgeon: [name], MD;  Location: [place];  Service: Orthopedics;  Laterality: Left;  . I&D EXTREMITY Left 05/09/2014   Procedure: Irrigation and Debridement Left Knee and Closure of Total Knee Arthroplasty Incision;  Surgeon: [name], MD;  Location: [place];  Service: Orthopedics;  Laterality: Left;  . I&D KNEE WITH POLY EXCHANGE Left 05/31/2014   Procedure: IRRIGATION AND DEBRIDEMENT LEFT KNEE, PLACE ANTIBIOTIC BEADS,  POLY EXCHANGE;  Surgeon: [name], MD;  Location: [place];  Service: Orthopedics;  Laterality: Left;  . KNEE ARTHROSCOPY Left 08-25-2012  . REFRACTIVE SURGERY Bilateral   . TOE AMPUTATION Bilateral    "I've lost 7 toes over the last 7 years" (05/09/2014)  . TOE SURGERY Left April 2015   Big toe removed on left foot.  . TONSILLECTOMY    . TOTAL KNEE ARTHROPLASTY Left 04/10/2014   Procedure: TOTAL KNEE ARTHROPLASTY;  Surgeon: [name], MD;  Location: [place];  Service: Orthopedics;  Laterality: Left;  . TOTAL KNEE REVISION Left 10/25/2014   Procedure: LEFT TOTAL KNEE REVISION;  Surgeon: [name], MD;  Location: [place];  Service: Orthopedics;  Laterality: Left;  . TOTAL KNEE REVISION Left 11/26/2015   Procedure: Removal Left Total Knee Arthroplasty, Hinged Total Knee Arthroplasty;  Surgeon: [name], MD;  Location: [place];  Service: Orthopedics;  Laterality: Left;  . UVULOPALATOPHARYNGOPLASTY, TONSILLECTOMY AND SEPTOPLASTY  ~ 1989  . WOUND DEBRIDEMENT Left 05/09/2014   Dehiscence Left Total Knee Arthroplasty Incision     Current Outpatient Prescriptions  Medication Sig Dispense Refill  . albuterol (PROVENTIL HFA;VENTOLIN HFA) 108 (90 Base) MCG/ACT inhaler Inhale 2 puffs into the lungs every 6 (six) hours as needed for wheezing or shortness of breath. 1 Inhaler 6  .  amLODipine (NORVASC) 10 MG tablet Take 10 mg by mouth daily.    Marland Kitchen aspirin 81 MG EC tablet TAKE 1 TABLET EVERY DAY (Patient taking differently: TAKE 2 TABLETs EVERY DAY) 30 tablet 6  . atorvastatin (LIPITOR) 10 MG tablet Take one tablet by mouth once daily at 6pm for cholesterol. **Needs an appointment before anymore refills. (Patient taking differently: 40 mg. Take one tablet by mouth once daily at 6pm for cholesterol. **Needs an appointment before anymore refills.) 30 tablet 0  . calcitRIOL (ROCALTROL) 1 MCG/ML solution Take 1 mcg by mouth every Monday, Wednesday, and Friday with hemodialysis. Calcitriol IV Injection active form of vitamin D.    . carvedilol (COREG) 25 MG tablet Take 25 mg by mouth 2 (two) times daily with a meal.    . ciprofloxacin (CIPRO) 500 MG tablet Take 1 tablet (500 mg total)  by mouth daily. 20 tablet 0  . collagenase (SANTYL) ointment Apply 1 application topically daily. 90 g 2  . fluticasone (FLONASE) 50 MCG/ACT nasal spray Place 2 sprays into both nostrils daily. (Patient taking differently: Place 2 sprays into both nostrils daily as needed. ) 16 g 6  . gabapentin (NEURONTIN) 300 MG capsule TAKE ONE CAPSULE BY MOUTH 3 TIMES A DAY (Patient taking differently: TAKE ONE CAPSULE BY MOUTH daily) 270 capsule 0  . montelukast (SINGULAIR) 10 MG tablet TAKE 1 TABLET (10 MG TOTAL) BY MOUTH AT BEDTIME. 90 tablet 1  . multivitamin (RENA-VIT) TABS tablet Take 1 tablet by mouth daily.    Marland Kitchen oxyCODONE-acetaminophen (PERCOCET/ROXICET) 5-325 MG tablet Take 1 tablet by mouth every 4 (four) hours as needed for severe pain. 60 tablet 0  . sevelamer carbonate (RENVELA) 800 MG tablet Take 800-3,200 mg by mouth 3 (three) times daily with meals. 800 mg-1600 mg with snacks (depends on dietary intake) and 2400 mg-3200 mg with meals    . sorbitol 70 % solution Take 30-60 mLs by mouth daily as needed (for constipation).   5  . sulfamethoxazole-trimethoprim (BACTRIM DS,SEPTRA DS) 800-160 MG tablet Take 1  tablet by mouth 2 (two) times daily. 20 tablet 0  . sulfamethoxazole-trimethoprim (BACTRIM DS,SEPTRA DS) 800-160 MG tablet Take 1 tablet by mouth daily. 10 tablet 0  . temazepam (RESTORIL) 30 MG capsule Take 30 mg by mouth at bedtime as needed. for sleep  2   No current facility-administered medications for this visit.     Allergies:   Morphine and related; Tygacil [tigecycline]; and Morphine    Social History:  The patient  reports that he quit smoking about 2 months ago. His smoking use included Cigarettes. He has a 3.84 pack-year smoking history. He quit smokeless tobacco use about 22 months ago. He reports that he does not drink alcohol or use drugs.   Family History:  The patient's family history includes Cancer (age of onset: 54) in his mother; Heart disease in his maternal aunt; Hypertension in his mother; Stroke in his maternal grandfather.    ROS:  Please see the history of present illness.   Otherwise, review of systems are positive for none.   All other systems are reviewed and negative.    PHYSICAL EXAM: VS:  BP (!) 157/83   Pulse 82   Ht 5\' 11"  (1.803 m)   Wt 199 lb 3.2 oz (90.4 kg)   BMI 27.78 kg/m  , BMI Body mass index is 27.78 kg/m. GEN: Well nourished, well developed, in no acute distress  HEENT: normal  Neck: no JVD, carotid bruits, or masses Cardiac: RRR; no murmurs, rubs, or gallops,no edema  Respiratory:  clear to auscultation bilaterally, normal work of breathing GI: soft, nontender, nondistended, + BS MS: no deformity or atrophy  Skin: warm and dry, no rash Neuro:  Strength and sensation are intact Psych: euthymic mood, full affect Vascular: Radial pulses was 2 on the right and +1 on the left, femoral pulse: +2 bilaterally. Distal pulses are not palpable. He has a left arm fistula.  EKG:  EKG is ordered today. The ekg ordered today demonstrates normal sinus rhythm with lateral T wave changes suggestive of ischemia.   Recent Labs: 08/17/2016: BUN 33;  Creatinine, Ser 5.67; Hemoglobin 10.4; Platelets 181; Potassium 6.3; Sodium 141    Lipid Panel    Component Value Date/Time   CHOL 180 07/06/2015 0124   CHOL 228 (H) 09/15/2012 1030   TRIG 118 07/06/2015  0124   HDL 44 07/06/2015 0124   HDL 40 09/15/2012 1030   CHOLHDL 4.1 07/06/2015 0124   VLDL 24 07/06/2015 0124   LDLCALC 112 (H) 07/06/2015 0124   LDLCALC 113 (H) 09/15/2012 1030      Wt Readings from Last 3 Encounters:  08/17/16 199 lb 3.2 oz (90.4 kg)  07/14/16 191 lb 12.8 oz (87 kg)  06/24/16 199 lb 11.8 oz (90.6 kg)       PAD Screen 08/17/2016  Previous PAD dx? No  Previous surgical procedure? Yes  Pain with walking? Yes  Subsides with rest? Yes  Feet/toe relief with dangling? No  Painful, non-healing ulcers? Yes  Extremities discolored? No      ASSESSMENT AND PLAN:  1.  Peripheral arterial disease with nonhealing wound on the left foot: In spite of normal ABI, the patient seems to have significant tibial disease with his only runoff being the peroneal artery. This might not be optimal for wound healing as it has to be in line flow to the posterior tibial artery distribution. I discussed the findings with the patient and recommended proceeding with abdominal aortogram, lower extremity runoff and possible endovascular intervention. I discussed the procedure in detail as well as risk and benefits. The patient wants to avoid prolonged dual antiplatelet therapy in order to be on a kidney transplant list. Obviously, the wound has to heal before this can be considered.  2. Coronary artery disease involving native coronary arteries without angina: Into new medical therapy.  3. Hyperlipidemia: Continue treatment with atorvastatin with a target LDL of less than 70.  4. Essential hypertension: Blood pressure is reasonably controlled.  5. Tobacco use: I discussed with him the importance of smoking cessation especially in the setting of peripheral arterial disease.  6.  End-stage renal disease on hemodialysis: Monday Wednesday and Friday. He will require dialysis after his angiogram.  Disposition:   FU with me in 1 month  Signed,  Kathlyn Sacramento, MD  08/19/2016 7:21 AM    [place]

## 2016-08-25 NOTE — Discharge Instructions (Signed)
Start Plavix 745 mg daily tomorrow. A prescription was sent to your pharmacy.   Angiogram, Care After This sheet gives you information about how to care for yourself after your procedure. Your health care provider may also give you more specific instructions. If you have problems or questions, contact your health care provider. What can I expect after the procedure? After the procedure, it is common to have bruising and tenderness at the catheter insertion area. Follow these instructions at home: Insertion site care  Follow instructions from your health care provider about how to take care of your insertion site. Make sure you: ? Wash your hands with soap and water before you change your bandage (dressing). If soap and water are not available, use hand sanitizer. ? Change your dressing as told by your health care provider. ? Leave stitches (sutures), skin glue, or adhesive strips in place. These skin closures may need to stay in place for 2 weeks or longer. If adhesive strip edges start to loosen and curl up, you may trim the loose edges. Do not remove adhesive strips completely unless your health care provider tells you to do that.  Do not take baths, swim, or use a hot tub until your health care provider approves.  You may shower 24-48 hours after the procedure or as told by your health care provider. ? Gently wash the site with plain soap and water. ? Pat the area dry with a clean towel. ? Do not rub the site. This may cause bleeding.  Do not apply powder or lotion to the site. Keep the site clean and dry.  Check your insertion site every day for signs of infection. Check for: ? Redness, swelling, or pain. ? Fluid or blood. ? Warmth. ? Pus or a bad smell. Activity  Rest as told by your health care provider, usually for 1-2 days.  Do not lift anything that is heavier than 10 lbs. (4.5 kg) or as told by your health care provider.  Do not drive for 24 hours if you were given a  medicine to help you relax (sedative).  Do not drive or use heavy machinery while taking prescription pain medicine. General instructions  Return to your normal activities as told by your health care provider, usually in about a week. Ask your health care provider what activities are safe for you.  If the catheter site starts bleeding, lie flat and put pressure on the site. If the bleeding does not stop, get help right away. This is a medical emergency.  Drink enough fluid to keep your urine clear or pale yellow. This helps flush the contrast dye from your body.  Take over-the-counter and prescription medicines only as told by your health care provider.  Keep all follow-up visits as told by your health care provider. This is important. Contact a health care provider if:  You have a fever or chills.  You have redness, swelling, or pain around your insertion site.  You have fluid or blood coming from your insertion site.  The insertion site feels warm to the touch.  You have pus or a bad smell coming from your insertion site.  You have bruising around the insertion site.  You notice blood collecting in the tissue around the catheter site (hematoma). The hematoma may be painful to the touch. Get help right away if:  You have severe pain at the catheter insertion area.  The catheter insertion area swells very fast.  The catheter insertion area is bleeding,  and the bleeding does not stop when you hold steady pressure on the area.  The area near or just beyond the catheter insertion site becomes pale, cool, tingly, or numb. These symptoms may represent a serious problem that is an emergency. Do not wait to see if the symptoms will go away. Get medical help right away. Call your local emergency services (911 in the [place] Do not drive yourself to the hospital. Summary  After the procedure, it is common to have bruising and tenderness at the catheter insertion area.  After the  procedure, it is important to rest and drink plenty of fluids.  Do not take baths, swim, or use a hot tub until your health care provider says it is okay to do so. You may shower 24-48 hours after the procedure or as told by your health care provider.  If the catheter site starts bleeding, lie flat and put pressure on the site. If the bleeding does not stop, get help right away. This is a medical emergency. This information is not intended to replace advice given to you by your health care provider. Make sure you discuss any questions you have with your health care provider. Document Released: 07/16/2004 Document Revised: 12/03/2015 Document Reviewed: 12/03/2015 Elsevier Interactive Patient Education  2017 [employer].

## 2016-08-25 NOTE — Procedures (Signed)
Pt seen on HD.  K sl high 6.1.  SP aortogram with run off.  Ap 70 Vp 40 BFR 210, HD just getting started.  He can leave the hospital post HD from renal standpoint.

## 2016-08-26 ENCOUNTER — Encounter (HOSPITAL_COMMUNITY): Payer: Self-pay | Admitting: Cardiovascular Disease

## 2016-08-26 MED FILL — Clopidogrel Bisulfate Tab 300 MG (Base Equiv): ORAL | Qty: 2 | Status: AC

## 2016-08-26 NOTE — Discharge Summary (Signed)
This is a 56 year old male who presented for abdominal aortogram with left lower extremity angiography given critical limb ischemia and nonhealing wound on the left foot. He has end-stage renal disease on hemodialysis. He underwent successful angiography and endovascular intervention on the left anterior tibial and peroneal arteries. The patient was noted to have hyperkalemia with a potassium of 6.1 and this was his dialysis today. Thus, nephrology were consulted and the patient underwent dialysis without complications. He was discharged post dialysis. He will follow-up in my office with ABI and lower extremity arterial duplex. He was started on Plavix.

## 2016-08-27 ENCOUNTER — Ambulatory Visit (HOSPITAL_COMMUNITY): Payer: Self-pay

## 2016-08-27 ENCOUNTER — Other Ambulatory Visit: Payer: Self-pay | Admitting: Cardiovascular Disease

## 2016-08-27 DIAGNOSIS — I739 Peripheral vascular disease, unspecified: Secondary | ICD-10-CM

## 2016-08-30 ENCOUNTER — Ambulatory Visit (HOSPITAL_COMMUNITY): Payer: Self-pay

## 2016-08-31 ENCOUNTER — Ambulatory Visit (HOSPITAL_COMMUNITY)
Admission: RE | Admit: 2016-08-31 | Discharge: 2016-08-31 | Disposition: A | Discharge status: Home / Self Care | Payer: Medicare HMO | Source: Ambulatory Visit | Attending: Cardiology | Admitting: Cardiology

## 2016-08-31 DIAGNOSIS — I739 Peripheral vascular disease, unspecified: Secondary | ICD-10-CM | POA: Insufficient documentation

## 2016-08-31 DIAGNOSIS — Z89412 Acquired absence of left great toe: Secondary | ICD-10-CM | POA: Insufficient documentation

## 2016-08-31 DIAGNOSIS — Z89411 Acquired absence of right great toe: Secondary | ICD-10-CM | POA: Diagnosis not present

## 2016-08-31 DIAGNOSIS — I771 Stricture of artery: Secondary | ICD-10-CM | POA: Insufficient documentation

## 2016-09-01 ENCOUNTER — Ambulatory Visit (HOSPITAL_COMMUNITY): Payer: Self-pay

## 2016-09-02 ENCOUNTER — Ambulatory Visit (INDEPENDENT_AMBULATORY_CARE_PROVIDER_SITE_OTHER): Payer: Medicare HMO | Admitting: Podiatry

## 2016-09-02 ENCOUNTER — Encounter: Payer: Self-pay | Admitting: Podiatry

## 2016-09-02 DIAGNOSIS — L97422 Non-pressure chronic ulcer of left heel and midfoot with fat layer exposed: Secondary | ICD-10-CM | POA: Diagnosis not present

## 2016-09-02 NOTE — Progress Notes (Signed)
Subjective: 56 year old male presents the office today for follow-up evaluation of wound to the left foot. Since possible median underwent angioplasty with Dr. Fletcher Anon. He has continue with calcium avulsion dressing changes as prescribed by Dr. Con Memos. He also is on vancomycin at dialysis and he said to doses so far. He has noticed since the procedure for increase the circulation is had some swelling to his foot as well as some discomfort. He is scheduled see Dr. Con Memos this afternoon as well. He's been wearing a surgical shoe when he walks. Denies any drainage or pus. Denies any systemic complaints such as fevers, chills, nausea, vomiting. No acute changes since last appointment, and no other complaints at this time.   Objective: AAO x3, NAD DP/PT pulses decreased , CRT less than 3 seconds On the plantar aspect the left foot there is a very large ulceration measuring a total of 7.6 x 4.6 cm with a depth of approximately 0.3 cm.  Overall the wound appears to be granular. There is minimal fibrotic tissue. Slight hyperkeratotic rim. There does appear to be some increases one to the foot as well as some localized erythema along the area. There is no ascending synovitis. There is no probing, undermining or tunneling. There is no areas of fluctuance or crepitus. There is no malodor. Chronic Charcot changes present as well as multiple digital amputations. No other open lesions or pre-ulcerative lesions identified today. No pain with calf compression, swelling, warmth, erythema  Assessment: Large ulceration left foot, concerns for osteomyelitis.   Plan: -All treatment options discussed with the patient including all alternatives, risks, complications. -We are discussion today in regards to his treatment. He states that he is going to see Dr. Con Memos again this afternoon and that they are considering hyperbaric oxygen therapy. At this point I discussed with him options. I believe that continuing aggressive  local wound care as well as vancomycin dialysis and combination with hyperbaric oxygen therapy will help give him every chance to help prevent further amputation. The wound overall appears to be doing better. There is some swelling and redness from the area which could be some localized infection, is currently on vancomycin. Also is recently been reperfuse which may be causing some of this. If there is any worsening directed to the emergency room. Continue with surgical shoe. Elevation. -As he seen Dr. Con Memos on a regular basis all seem back in 3 weeks by discussed and that he needs anything of any change in symptoms to call our office and I can see him sooner.   Celesta Gentile, DPM

## 2016-09-02 NOTE — Patient Instructions (Signed)
Continue to follow-up with Dr. Con Memos. If you do not seem him you need to check in with me. Otherwise, I will see you in 3 weeks. If you need anything, please let me know.

## 2016-09-03 ENCOUNTER — Ambulatory Visit (HOSPITAL_COMMUNITY): Payer: Self-pay

## 2016-09-06 ENCOUNTER — Ambulatory Visit (HOSPITAL_COMMUNITY): Payer: Self-pay

## 2016-09-07 ENCOUNTER — Encounter: Payer: Self-pay | Admitting: Cardiovascular Disease

## 2016-09-07 ENCOUNTER — Ambulatory Visit (INDEPENDENT_AMBULATORY_CARE_PROVIDER_SITE_OTHER): Payer: Medicare HMO | Admitting: Cardiovascular Disease

## 2016-09-07 VITALS — BP 138/82 | HR 79 | Ht 71.0 in | Wt 203.0 lb

## 2016-09-07 DIAGNOSIS — I739 Peripheral vascular disease, unspecified: Secondary | ICD-10-CM

## 2016-09-07 DIAGNOSIS — I2510 Atherosclerotic heart disease of native coronary artery without angina pectoris: Secondary | ICD-10-CM

## 2016-09-07 DIAGNOSIS — Z720 Tobacco use: Secondary | ICD-10-CM

## 2016-09-07 DIAGNOSIS — I10 Essential (primary) hypertension: Secondary | ICD-10-CM

## 2016-09-07 DIAGNOSIS — E785 Hyperlipidemia, unspecified: Secondary | ICD-10-CM | POA: Diagnosis not present

## 2016-09-07 NOTE — Progress Notes (Signed)
Cardiology Office Note   Date:  09/07/2016   ID:  Matthew Kline, DOB [birth year], MRN [id number]  PCP:  Donald Prose, MD  Cardiologist:  [facility]   Referred by Dr. Jacqualyn Posey.  Chief Complaint  Patient presents with  . Follow-up    post cath      History of Present Illness: Matthew Kline is a 56 y.o. male who is here today for a follow-up visit regarding peripheral arterial disease.  He has known history of coronary artery disease status post CABG in May 2018, type 2 diabetes, end-stage renal disease on hemodialysis, essential hypertension, CVA, tobacco use and peripheral neuropathy. The patient had morbid obesity in the past but lost significant amount of weight with improving lifestyle. He is on dialysis Monday Wednesday and Friday and his nephrologist is Dr. Lorrene Reid. He continues to smoke one quarter pack per day but is trying to quit. He was seen recently for large ulceration of the plantar aspect of the left foot with concerns for osteomylitis. He had previous multiple digit amputations. The ulceration started shortly after CABG in May when he was trying to do physical therapy in the setting of Charcot's foot.  He had left knee replacement done 3 times before.  I proceeded with angiography on August 15 which showed no significant aortoiliac disease. There was mild diffuse left SFA and popliteal disease with severe bifurcation stenosis at the origin of the anterior tibial artery and TP trunk into the peroneal artery with 2 vessel runoff. The posterior tibial artery was occluded. I performed successful bifurcation angioplasty of the anterior tibial artery with a TP trunk. Post procedure duplex showed moderately elevated velocity in the popliteal artery and TP trunk.The wound has improved since then but there is more swelling affecting the left foot. He has mild discomfort.   Past Medical History:  Diagnosis Date  . Allergy   . Anemia, unspecified   . Anxiety   . Arthralgia 2010     polyarticular  . Arthritis    "back, knees" (05/09/2014)  . CHF (congestive heart failure) ([place])   . CHF (congestive heart failure) ([place]) 07/25/2009   denies  . Chronic lower back pain   . Coronary artery disease   . Coughing    pt. reports that he has drainage from sinus infection  . Diabetic neuropathy ([place])   . ESRD (end stage renal disease) on dialysis [facility])    started 12/2012; "MWF; [place] "   . GERD (gastroesophageal reflux disease)    hx "before I lost weight", no problem 9 years  . Hemodialysis access site with mature fistula ([place])   . Hemorrhoids, internal 10/2011   small  . History of blood transfusion    "related to the anemia"  . Hypertension   . Insomnia, unspecified   . Knee pain, left   . Lacunar infarction ([place]) 2006   RUE/RLE, speech  . Long term (current) use of anticoagulants   . Myocardial infarction ([place]) 1995  . Orthostatic hypotension   . Osteomyelitis of foot, left, acute ([place])   . Other chronic postoperative pain   . Pneumonia    "probably 4-5 times" (05/09/2014)  . Polymyalgia rheumatica ([place])   . Renal insufficiency   . Sleep apnea    "lost weight; no more problem" (05/09/2014)  . Stroke ([place]) 01/10/06   denies residual on 05/09/2014  . Type II diabetes mellitus ([place]) dx'd 1995  . Ulcer    diabetic foot   . Unspecified hereditary  and idiopathic peripheral neuropathy    feet  . Unspecified osteomyelitis, site unspecified   . Unspecified vitamin D deficiency     Past Surgical History:  Procedure Laterality Date  . ABDOMINAL AORTOGRAM N/A 08/25/2016   Procedure: ABDOMINAL AORTOGRAM;  Surgeon: Wellington Hampshire, MD;  Location: [place] [facility];  Service: Cardiovascular;  Laterality: N/A;  . AMPUTATION  01/21/2012   Procedure: AMPUTATION RAY;  Surgeon: [name], MD;  Location: [place];  Service: Orthopedics;  Laterality: Left;  Left Foot 4th Ray Amputation  . AMPUTATION Left 05/04/2013   Procedure: AMPUTATION DIGIT;  Surgeon:  [name], MD;  Location: [place];  Service: Orthopedics;  Laterality: Left;  Left Great Toe Amputation at [facility]  . ANTERIOR CERVICAL DECOMP/DISCECTOMY FUSION  02/2011  . BASCILIC VEIN TRANSPOSITION Left 10/19/2012   Procedure: BASCILIC VEIN TRANSPOSITION;  Surgeon: Serafina Mitchell, MD;  Location: [place];  Service: Vascular;  Laterality: Left;  . CORONARY ARTERY BYPASS GRAFT     x 5 with lima at [place] WITH ANTIBIOTIC SPACERS Left 08/07/2014   Procedure: Replace Left Total Knee Arthroplasty,  Place Antibiotic Spacer;  Surgeon: [name], MD;  Location: [place];  Service: Orthopedics;  Laterality: Left;  . I&D EXTREMITY Left 05/09/2014   Procedure: Irrigation and Debridement Left Knee and Closure of Total Knee Arthroplasty Incision;  Surgeon: [name], MD;  Location: [place];  Service: Orthopedics;  Laterality: Left;  . I&D KNEE WITH POLY EXCHANGE Left 05/31/2014   Procedure: IRRIGATION AND DEBRIDEMENT LEFT KNEE, PLACE ANTIBIOTIC BEADS,  POLY EXCHANGE;  Surgeon: [name], MD;  Location: [place];  Service: Orthopedics;  Laterality: Left;  . KNEE ARTHROSCOPY Left 08-25-2012  . LOWER EXTREMITY ANGIOGRAPHY Left 08/25/2016   Procedure: Lower Extremity Angiography;  Surgeon: Wellington Hampshire, MD;  Location: [place] [facility];  Service: Cardiovascular;  Laterality: Left;  . PERIPHERAL VASCULAR BALLOON ANGIOPLASTY Left 08/25/2016   Procedure: PERIPHERAL VASCULAR BALLOON ANGIOPLASTY;  Surgeon: Wellington Hampshire, MD;  Location: [place] [facility];  Service: Cardiovascular;  Laterality: Left;  lt peroneal and ant tibial arteries cutting balloon  . REFRACTIVE SURGERY Bilateral   . TOE AMPUTATION Bilateral    "I've lost 7 toes over the last 7 years" (05/09/2014)  . TOE SURGERY Left April 2015   Big toe removed on left foot.  . TONSILLECTOMY    . TOTAL KNEE ARTHROPLASTY Left 04/10/2014   Procedure: TOTAL KNEE ARTHROPLASTY;  Surgeon: [name], MD;  Location:  [place];  Service: Orthopedics;  Laterality: Left;  . TOTAL KNEE REVISION Left 10/25/2014   Procedure: LEFT TOTAL KNEE REVISION;  Surgeon: [name], MD;  Location: [place];  Service: Orthopedics;  Laterality: Left;  . TOTAL KNEE REVISION Left 11/26/2015   Procedure: Removal Left Total Knee Arthroplasty, Hinged Total Knee Arthroplasty;  Surgeon: [name], MD;  Location: [place];  Service: Orthopedics;  Laterality: Left;  . UVULOPALATOPHARYNGOPLASTY, TONSILLECTOMY AND SEPTOPLASTY  ~ 1989  . WOUND DEBRIDEMENT Left 05/09/2014   Dehiscence Left Total Knee Arthroplasty Incision     Current Outpatient Prescriptions  Medication Sig Dispense Refill  . amLODipine (NORVASC) 5 MG tablet Take 5 mg by mouth at bedtime.    Marland Kitchen aspirin 81 MG EC tablet TAKE 1 TABLET EVERY DAY (Patient taking differently: Take 162 mg by mouth daily) 30 tablet 6  . atorvastatin (LIPITOR) 40 MG tablet Take 40 mg by  mouth daily at 6 PM.    . carvedilol (COREG) 25 MG tablet Take 25 mg by mouth 2 (two) times daily with a meal.    . clopidogrel (PLAVIX) 75 MG tablet Take 1 tablet (75 mg total) by mouth daily. 30 tablet 6  . gabapentin (NEURONTIN) 300 MG capsule TAKE ONE CAPSULE BY MOUTH 3 TIMES A DAY (Patient taking differently: TAKE ONE CAPSULE BY MOUTH DAILY IN THE EVENING) 270 capsule 0  . montelukast (SINGULAIR) 10 MG tablet TAKE 1 TABLET (10 MG TOTAL) BY MOUTH AT BEDTIME. 90 tablet 1  . sevelamer carbonate (RENVELA) 800 MG tablet Take 2,400-3,200 mg by mouth 3 (three) times daily with meals. Take 2400 to 3200 mg with each me (based on the size of the meal) 3 times daily and take 800 mg with large snacks    . temazepam (RESTORIL) 30 MG capsule Take 30 mg by mouth at bedtime.   2   No current facility-administered medications for this visit.     Allergies:   Morphine and related and Tygacil [tigecycline]    Social History:  The patient  reports that he quit smoking about 3 months ago. His smoking use included Cigarettes. He  has a 3.84 pack-year smoking history. He quit smokeless tobacco use about 23 months ago. He reports that he does not drink alcohol or use drugs.   Family History:  The patient's family history includes Cancer (age of onset: 20) in his mother; Heart disease in his maternal aunt; Hypertension in his mother; Stroke in his maternal grandfather.    ROS:  Please see the history of present illness.   Otherwise, review of systems are positive for none.   All other systems are reviewed and negative.    PHYSICAL EXAM: VS:  BP 138/82   Pulse 79   Ht 5\' 11"  (1.803 m)   Wt 203 lb (92.1 kg)   BMI 28.31 kg/m  , BMI Body mass index is 28.31 kg/m. GEN: Well nourished, well developed, in no acute distress  HEENT: normal  Neck: no JVD, carotid bruits, or masses Cardiac: RRR; no murmurs, rubs, or gallops,no edema  Respiratory:  clear to auscultation bilaterally, normal work of breathing GI: soft, nontender, nondistended, + BS MS: no deformity or atrophy  Skin: warm and dry, no rash Neuro:  Strength and sensation are intact Psych: euthymic mood, full affect Vascular: Radial pulses was 2 on the right and +1 on the left, femoral pulse: +2 bilaterally. No groin hematoma although there is superficial bruising.  There is granulation tissue on the periphery of the wound. There is mild swelling.  EKG:  EKG is ordered today. The ekg ordered today demonstrates normal sinus rhythm with lateral T wave changes suggestive of ischemia.   Recent Labs: 08/17/2016: Hemoglobin 10.4; Platelets 181 08/25/2016: BUN 62; Creatinine, Ser 7.54; Potassium 6.1; Sodium 137    Lipid Panel    Component Value Date/Time   CHOL 180 07/06/2015 0124   CHOL 228 (H) 09/15/2012 1030   TRIG 118 07/06/2015 0124   HDL 44 07/06/2015 0124   HDL 40 09/15/2012 1030   CHOLHDL 4.1 07/06/2015 0124   VLDL 24 07/06/2015 0124   LDLCALC 112 (H) 07/06/2015 0124   LDLCALC 113 (H) 09/15/2012 1030      Wt Readings from Last 3 Encounters:   09/07/16 203 lb (92.1 kg)  08/25/16 190 lb 11.2 oz (86.5 kg)  08/17/16 199 lb 3.2 oz (90.4 kg)       PAD Screen  08/17/2016  Previous PAD dx? No  Previous surgical procedure? Yes  Pain with walking? Yes  Subsides with rest? Yes  Feet/toe relief with dangling? No  Painful, non-healing ulcers? Yes  Extremities discolored? No      ASSESSMENT AND PLAN:  1.  Peripheral arterial disease with nonhealing wound on the left foot: Status post recent successful revascularization of the bifurcation anterior tibial/TP trunk into the peroneal artery without stent placement. Continue dual antiplatelet therapy until the wound heals. This area is high risk for restenosis and if it does, it would require drug-eluting stent placement. I will have him follow-up with me in 2 months but he is to notify me if healing does not progress as expected.  2. Coronary artery disease involving native coronary arteries without angina: Into new medical therapy.  3. Hyperlipidemia: Continue treatment with atorvastatin with a target LDL of less than 70.  4. Essential hypertension: Blood pressure is reasonably controlled.  5. Tobacco use: He needs to quit smoking.  6. End-stage renal disease on hemodialysis: Monday Wednesday and Friday.   Disposition:   FU with me in 2 month  Signed,  Kathlyn Sacramento, MD  09/07/2016 10:37 AM    [place]

## 2016-09-07 NOTE — Patient Instructions (Signed)
Medication Instructions: Your physician recommends that you continue on your current medications as directed. Please refer to the Current Medication list given to you today.   Follow-Up: Your physician recommends that you schedule a follow-up appointment in: 2 months with Dr. Fletcher Anon.  If you need a refill on your cardiac medications before your next appointment, please call your pharmacy.

## 2016-09-08 ENCOUNTER — Ambulatory Visit (HOSPITAL_COMMUNITY): Payer: Self-pay

## 2016-09-09 DIAGNOSIS — Z7951 Long term (current) use of inhaled steroids: Secondary | ICD-10-CM | POA: Diagnosis not present

## 2016-09-09 DIAGNOSIS — N186 End stage renal disease: Secondary | ICD-10-CM | POA: Diagnosis not present

## 2016-09-09 DIAGNOSIS — I252 Old myocardial infarction: Secondary | ICD-10-CM | POA: Diagnosis not present

## 2016-09-09 DIAGNOSIS — Z992 Dependence on renal dialysis: Secondary | ICD-10-CM | POA: Diagnosis not present

## 2016-09-09 DIAGNOSIS — F17210 Nicotine dependence, cigarettes, uncomplicated: Secondary | ICD-10-CM | POA: Diagnosis not present

## 2016-09-09 DIAGNOSIS — E1122 Type 2 diabetes mellitus with diabetic chronic kidney disease: Secondary | ICD-10-CM | POA: Diagnosis not present

## 2016-09-09 DIAGNOSIS — Z89411 Acquired absence of right great toe: Secondary | ICD-10-CM | POA: Diagnosis not present

## 2016-09-09 DIAGNOSIS — E1140 Type 2 diabetes mellitus with diabetic neuropathy, unspecified: Secondary | ICD-10-CM | POA: Diagnosis not present

## 2016-09-09 DIAGNOSIS — Z79891 Long term (current) use of opiate analgesic: Secondary | ICD-10-CM | POA: Diagnosis not present

## 2016-09-09 DIAGNOSIS — E11621 Type 2 diabetes mellitus with foot ulcer: Secondary | ICD-10-CM | POA: Diagnosis present

## 2016-09-09 DIAGNOSIS — E1151 Type 2 diabetes mellitus with diabetic peripheral angiopathy without gangrene: Secondary | ICD-10-CM | POA: Diagnosis not present

## 2016-09-09 DIAGNOSIS — Z8614 Personal history of Methicillin resistant Staphylococcus aureus infection: Secondary | ICD-10-CM | POA: Diagnosis not present

## 2016-09-09 DIAGNOSIS — Z951 Presence of aortocoronary bypass graft: Secondary | ICD-10-CM | POA: Diagnosis not present

## 2016-09-09 DIAGNOSIS — E785 Hyperlipidemia, unspecified: Secondary | ICD-10-CM | POA: Diagnosis not present

## 2016-09-09 DIAGNOSIS — I129 Hypertensive chronic kidney disease with stage 1 through stage 4 chronic kidney disease, or unspecified chronic kidney disease: Secondary | ICD-10-CM | POA: Diagnosis not present

## 2016-09-09 DIAGNOSIS — Z7982 Long term (current) use of aspirin: Secondary | ICD-10-CM | POA: Diagnosis not present

## 2016-09-09 DIAGNOSIS — Z79899 Other long term (current) drug therapy: Secondary | ICD-10-CM | POA: Diagnosis not present

## 2016-09-09 DIAGNOSIS — I2510 Atherosclerotic heart disease of native coronary artery without angina pectoris: Secondary | ICD-10-CM | POA: Diagnosis not present

## 2016-09-09 DIAGNOSIS — Z8673 Personal history of transient ischemic attack (TIA), and cerebral infarction without residual deficits: Secondary | ICD-10-CM | POA: Diagnosis not present

## 2016-09-09 DIAGNOSIS — E11610 Type 2 diabetes mellitus with diabetic neuropathic arthropathy: Secondary | ICD-10-CM | POA: Diagnosis not present

## 2016-09-09 DIAGNOSIS — Z89422 Acquired absence of other left toe(s): Secondary | ICD-10-CM | POA: Diagnosis not present

## 2016-09-09 DIAGNOSIS — Z96659 Presence of unspecified artificial knee joint: Secondary | ICD-10-CM | POA: Diagnosis not present

## 2016-09-09 DIAGNOSIS — L97422 Non-pressure chronic ulcer of left heel and midfoot with fat layer exposed: Secondary | ICD-10-CM | POA: Diagnosis not present

## 2016-09-10 ENCOUNTER — Ambulatory Visit (HOSPITAL_COMMUNITY): Payer: Self-pay

## 2016-09-15 ENCOUNTER — Ambulatory Visit (HOSPITAL_COMMUNITY): Payer: Self-pay

## 2016-09-16 ENCOUNTER — Encounter (HOSPITAL_BASED_OUTPATIENT_CLINIC_OR_DEPARTMENT_OTHER): Payer: Medicare HMO | Attending: Internal Medicine

## 2016-09-16 DIAGNOSIS — M86372 Chronic multifocal osteomyelitis, left ankle and foot: Secondary | ICD-10-CM | POA: Insufficient documentation

## 2016-09-16 DIAGNOSIS — E1151 Type 2 diabetes mellitus with diabetic peripheral angiopathy without gangrene: Secondary | ICD-10-CM | POA: Diagnosis not present

## 2016-09-16 DIAGNOSIS — Z7902 Long term (current) use of antithrombotics/antiplatelets: Secondary | ICD-10-CM | POA: Diagnosis not present

## 2016-09-16 DIAGNOSIS — I252 Old myocardial infarction: Secondary | ICD-10-CM | POA: Insufficient documentation

## 2016-09-16 DIAGNOSIS — I2510 Atherosclerotic heart disease of native coronary artery without angina pectoris: Secondary | ICD-10-CM | POA: Insufficient documentation

## 2016-09-16 DIAGNOSIS — L97522 Non-pressure chronic ulcer of other part of left foot with fat layer exposed: Secondary | ICD-10-CM | POA: Diagnosis not present

## 2016-09-16 DIAGNOSIS — F17210 Nicotine dependence, cigarettes, uncomplicated: Secondary | ICD-10-CM | POA: Diagnosis not present

## 2016-09-16 DIAGNOSIS — N186 End stage renal disease: Secondary | ICD-10-CM | POA: Diagnosis not present

## 2016-09-16 DIAGNOSIS — E1140 Type 2 diabetes mellitus with diabetic neuropathy, unspecified: Secondary | ICD-10-CM | POA: Insufficient documentation

## 2016-09-16 DIAGNOSIS — E1122 Type 2 diabetes mellitus with diabetic chronic kidney disease: Secondary | ICD-10-CM | POA: Insufficient documentation

## 2016-09-16 DIAGNOSIS — D638 Anemia in other chronic diseases classified elsewhere: Secondary | ICD-10-CM | POA: Insufficient documentation

## 2016-09-16 DIAGNOSIS — Z96659 Presence of unspecified artificial knee joint: Secondary | ICD-10-CM | POA: Diagnosis not present

## 2016-09-16 DIAGNOSIS — E11621 Type 2 diabetes mellitus with foot ulcer: Secondary | ICD-10-CM | POA: Diagnosis not present

## 2016-09-16 DIAGNOSIS — Z992 Dependence on renal dialysis: Secondary | ICD-10-CM | POA: Diagnosis not present

## 2016-09-17 ENCOUNTER — Ambulatory Visit (HOSPITAL_COMMUNITY): Payer: Self-pay

## 2016-09-20 ENCOUNTER — Ambulatory Visit (HOSPITAL_COMMUNITY): Payer: Self-pay

## 2016-09-22 ENCOUNTER — Ambulatory Visit (HOSPITAL_COMMUNITY): Payer: Self-pay

## 2016-09-23 ENCOUNTER — Ambulatory Visit: Payer: Medicare HMO | Admitting: Podiatry

## 2016-09-23 DIAGNOSIS — E11621 Type 2 diabetes mellitus with foot ulcer: Secondary | ICD-10-CM | POA: Diagnosis not present

## 2016-09-24 ENCOUNTER — Ambulatory Visit (HOSPITAL_COMMUNITY): Payer: Self-pay

## 2016-09-27 ENCOUNTER — Ambulatory Visit (HOSPITAL_COMMUNITY): Payer: Self-pay

## 2016-09-28 ENCOUNTER — Ambulatory Visit: Payer: Medicare HMO | Admitting: Podiatry

## 2016-09-29 ENCOUNTER — Ambulatory Visit (HOSPITAL_COMMUNITY): Payer: Self-pay

## 2016-09-30 DIAGNOSIS — E11621 Type 2 diabetes mellitus with foot ulcer: Secondary | ICD-10-CM | POA: Diagnosis not present

## 2016-10-07 DIAGNOSIS — E11621 Type 2 diabetes mellitus with foot ulcer: Secondary | ICD-10-CM | POA: Diagnosis not present

## 2016-10-08 ENCOUNTER — Telehealth: Payer: Self-pay | Admitting: Podiatry

## 2016-10-08 NOTE — Telephone Encounter (Signed)
This is Angie calling from [facility]. Dr. Jacqualyn Posey had prescribed IV antibiotics for the pt and we need clarification on those orders. Was the pt supposed to get them for just 3 weeks, because if so, that time period has ended. Please call me back at [phone]. Thank you.

## 2016-10-08 NOTE — Telephone Encounter (Signed)
Called Angie back at [facility], she is to return my call.

## 2016-10-11 ENCOUNTER — Encounter (HOSPITAL_BASED_OUTPATIENT_CLINIC_OR_DEPARTMENT_OTHER): Payer: Medicare HMO | Attending: Internal Medicine

## 2016-10-11 DIAGNOSIS — M86372 Chronic multifocal osteomyelitis, left ankle and foot: Secondary | ICD-10-CM | POA: Insufficient documentation

## 2016-10-11 DIAGNOSIS — E1169 Type 2 diabetes mellitus with other specified complication: Secondary | ICD-10-CM | POA: Insufficient documentation

## 2016-10-11 DIAGNOSIS — E1122 Type 2 diabetes mellitus with diabetic chronic kidney disease: Secondary | ICD-10-CM | POA: Diagnosis not present

## 2016-10-11 DIAGNOSIS — F17200 Nicotine dependence, unspecified, uncomplicated: Secondary | ICD-10-CM | POA: Diagnosis not present

## 2016-10-11 DIAGNOSIS — I120 Hypertensive chronic kidney disease with stage 5 chronic kidney disease or end stage renal disease: Secondary | ICD-10-CM | POA: Insufficient documentation

## 2016-10-11 DIAGNOSIS — I2510 Atherosclerotic heart disease of native coronary artery without angina pectoris: Secondary | ICD-10-CM | POA: Insufficient documentation

## 2016-10-11 DIAGNOSIS — I252 Old myocardial infarction: Secondary | ICD-10-CM | POA: Insufficient documentation

## 2016-10-11 DIAGNOSIS — Z96659 Presence of unspecified artificial knee joint: Secondary | ICD-10-CM | POA: Insufficient documentation

## 2016-10-11 DIAGNOSIS — B965 Pseudomonas (aeruginosa) (mallei) (pseudomallei) as the cause of diseases classified elsewhere: Secondary | ICD-10-CM | POA: Diagnosis not present

## 2016-10-11 DIAGNOSIS — Z992 Dependence on renal dialysis: Secondary | ICD-10-CM | POA: Diagnosis not present

## 2016-10-11 DIAGNOSIS — N186 End stage renal disease: Secondary | ICD-10-CM | POA: Insufficient documentation

## 2016-10-11 DIAGNOSIS — E11621 Type 2 diabetes mellitus with foot ulcer: Secondary | ICD-10-CM | POA: Diagnosis present

## 2016-10-11 DIAGNOSIS — E1140 Type 2 diabetes mellitus with diabetic neuropathy, unspecified: Secondary | ICD-10-CM | POA: Diagnosis not present

## 2016-10-11 DIAGNOSIS — L97428 Non-pressure chronic ulcer of left heel and midfoot with other specified severity: Secondary | ICD-10-CM | POA: Insufficient documentation

## 2016-10-18 DIAGNOSIS — E11621 Type 2 diabetes mellitus with foot ulcer: Secondary | ICD-10-CM | POA: Diagnosis not present

## 2016-10-25 ENCOUNTER — Other Ambulatory Visit (HOSPITAL_COMMUNITY)
Admission: RE | Admit: 2016-10-25 | Discharge: 2016-10-25 | Disposition: A | Discharge status: Home / Self Care | Payer: Medicare HMO | Source: Other Acute Inpatient Hospital | Attending: Internal Medicine | Admitting: Internal Medicine

## 2016-10-25 ENCOUNTER — Other Ambulatory Visit: Payer: Self-pay | Admitting: Internal Medicine

## 2016-10-25 DIAGNOSIS — M86372 Chronic multifocal osteomyelitis, left ankle and foot: Secondary | ICD-10-CM | POA: Insufficient documentation

## 2016-10-25 DIAGNOSIS — E11621 Type 2 diabetes mellitus with foot ulcer: Secondary | ICD-10-CM | POA: Insufficient documentation

## 2016-10-25 DIAGNOSIS — L97424 Non-pressure chronic ulcer of left heel and midfoot with necrosis of bone: Secondary | ICD-10-CM | POA: Diagnosis not present

## 2016-10-30 LAB — AEROBIC/ANAEROBIC CULTURE (SURGICAL/DEEP WOUND)

## 2016-10-30 LAB — AEROBIC/ANAEROBIC CULTURE W GRAM STAIN (SURGICAL/DEEP WOUND): Culture: NO GROWTH

## 2016-11-04 DIAGNOSIS — E11621 Type 2 diabetes mellitus with foot ulcer: Secondary | ICD-10-CM | POA: Diagnosis not present

## 2016-11-08 ENCOUNTER — Emergency Department (HOSPITAL_COMMUNITY): Payer: Medicare HMO

## 2016-11-08 DIAGNOSIS — K2960 Other gastritis without bleeding: Secondary | ICD-10-CM | POA: Insufficient documentation

## 2016-11-08 DIAGNOSIS — I120 Hypertensive chronic kidney disease with stage 5 chronic kidney disease or end stage renal disease: Secondary | ICD-10-CM | POA: Diagnosis not present

## 2016-11-08 DIAGNOSIS — N186 End stage renal disease: Secondary | ICD-10-CM | POA: Diagnosis not present

## 2016-11-08 DIAGNOSIS — Z8673 Personal history of transient ischemic attack (TIA), and cerebral infarction without residual deficits: Secondary | ICD-10-CM | POA: Insufficient documentation

## 2016-11-08 DIAGNOSIS — G4700 Insomnia, unspecified: Secondary | ICD-10-CM | POA: Insufficient documentation

## 2016-11-08 DIAGNOSIS — A419 Sepsis, unspecified organism: Secondary | ICD-10-CM | POA: Insufficient documentation

## 2016-11-08 DIAGNOSIS — D638 Anemia in other chronic diseases classified elsewhere: Secondary | ICD-10-CM | POA: Insufficient documentation

## 2016-11-08 DIAGNOSIS — Z883 Allergy status to other anti-infective agents status: Secondary | ICD-10-CM | POA: Insufficient documentation

## 2016-11-08 DIAGNOSIS — Z951 Presence of aortocoronary bypass graft: Secondary | ICD-10-CM | POA: Insufficient documentation

## 2016-11-08 DIAGNOSIS — M170 Bilateral primary osteoarthritis of knee: Secondary | ICD-10-CM | POA: Insufficient documentation

## 2016-11-08 DIAGNOSIS — E1122 Type 2 diabetes mellitus with diabetic chronic kidney disease: Secondary | ICD-10-CM | POA: Diagnosis not present

## 2016-11-08 DIAGNOSIS — E1151 Type 2 diabetes mellitus with diabetic peripheral angiopathy without gangrene: Secondary | ICD-10-CM | POA: Insufficient documentation

## 2016-11-08 DIAGNOSIS — M4690 Unspecified inflammatory spondylopathy, site unspecified: Secondary | ICD-10-CM | POA: Insufficient documentation

## 2016-11-08 DIAGNOSIS — R509 Fever, unspecified: Principal | ICD-10-CM | POA: Insufficient documentation

## 2016-11-08 DIAGNOSIS — Z79899 Other long term (current) drug therapy: Secondary | ICD-10-CM | POA: Insufficient documentation

## 2016-11-08 DIAGNOSIS — Z96652 Presence of left artificial knee joint: Secondary | ICD-10-CM | POA: Insufficient documentation

## 2016-11-08 DIAGNOSIS — M353 Polymyalgia rheumatica: Secondary | ICD-10-CM | POA: Insufficient documentation

## 2016-11-08 DIAGNOSIS — F329 Major depressive disorder, single episode, unspecified: Secondary | ICD-10-CM | POA: Insufficient documentation

## 2016-11-08 DIAGNOSIS — Z7901 Long term (current) use of anticoagulants: Secondary | ICD-10-CM | POA: Insufficient documentation

## 2016-11-08 DIAGNOSIS — I252 Old myocardial infarction: Secondary | ICD-10-CM | POA: Insufficient documentation

## 2016-11-08 DIAGNOSIS — F419 Anxiety disorder, unspecified: Secondary | ICD-10-CM | POA: Insufficient documentation

## 2016-11-08 DIAGNOSIS — Z885 Allergy status to narcotic agent status: Secondary | ICD-10-CM | POA: Insufficient documentation

## 2016-11-08 DIAGNOSIS — I5030 Unspecified diastolic (congestive) heart failure: Secondary | ICD-10-CM | POA: Insufficient documentation

## 2016-11-08 DIAGNOSIS — K219 Gastro-esophageal reflux disease without esophagitis: Secondary | ICD-10-CM | POA: Insufficient documentation

## 2016-11-08 DIAGNOSIS — I2510 Atherosclerotic heart disease of native coronary artery without angina pectoris: Secondary | ICD-10-CM | POA: Insufficient documentation

## 2016-11-08 DIAGNOSIS — G8929 Other chronic pain: Secondary | ICD-10-CM | POA: Insufficient documentation

## 2016-11-08 DIAGNOSIS — E089 Diabetes mellitus due to underlying condition without complications: Secondary | ICD-10-CM | POA: Insufficient documentation

## 2016-11-08 DIAGNOSIS — I110 Hypertensive heart disease with heart failure: Secondary | ICD-10-CM | POA: Diagnosis not present

## 2016-11-08 DIAGNOSIS — Z992 Dependence on renal dialysis: Secondary | ICD-10-CM | POA: Insufficient documentation

## 2016-11-08 DIAGNOSIS — Z7902 Long term (current) use of antithrombotics/antiplatelets: Secondary | ICD-10-CM | POA: Insufficient documentation

## 2016-11-08 DIAGNOSIS — Z87891 Personal history of nicotine dependence: Secondary | ICD-10-CM | POA: Insufficient documentation

## 2016-11-08 DIAGNOSIS — Z89421 Acquired absence of other right toe(s): Secondary | ICD-10-CM | POA: Insufficient documentation

## 2016-11-08 DIAGNOSIS — E785 Hyperlipidemia, unspecified: Secondary | ICD-10-CM | POA: Insufficient documentation

## 2016-11-08 DIAGNOSIS — E1143 Type 2 diabetes mellitus with diabetic autonomic (poly)neuropathy: Secondary | ICD-10-CM | POA: Diagnosis not present

## 2016-11-08 DIAGNOSIS — Z89422 Acquired absence of other left toe(s): Secondary | ICD-10-CM | POA: Insufficient documentation

## 2016-11-08 DIAGNOSIS — Z7982 Long term (current) use of aspirin: Secondary | ICD-10-CM | POA: Insufficient documentation

## 2016-11-08 LAB — CBC WITH DIFFERENTIAL/PLATELET
BASOS ABS: 0 10*3/uL (ref 0.0–0.1)
Basophils Relative: 0 %
EOS ABS: 0 10*3/uL (ref 0.0–0.7)
EOS PCT: 0 %
HCT: 30.8 % — ABNORMAL LOW (ref 39.0–52.0)
Hemoglobin: 9.3 g/dL — ABNORMAL LOW (ref 13.0–17.0)
LYMPHS ABS: 0.5 10*3/uL — AB (ref 0.7–4.0)
Lymphocytes Relative: 8 %
MCH: 27.5 pg (ref 26.0–34.0)
MCHC: 30.2 g/dL (ref 30.0–36.0)
MCV: 91.1 fL (ref 78.0–100.0)
MONO ABS: 0.3 10*3/uL (ref 0.1–1.0)
Monocytes Relative: 5 %
Neutro Abs: 5.6 10*3/uL (ref 1.7–7.7)
Neutrophils Relative %: 87 %
Platelets: 158 10*3/uL (ref 150–400)
RBC: 3.38 MIL/uL — AB (ref 4.22–5.81)
RDW: 19 % — AB (ref 11.5–15.5)
WBC: 6.4 10*3/uL (ref 4.0–10.5)

## 2016-11-08 LAB — COMPREHENSIVE METABOLIC PANEL
ALK PHOS: 111 U/L (ref 38–126)
ALT: 9 U/L — AB (ref 17–63)
AST: 17 U/L (ref 15–41)
Albumin: 2.8 g/dL — ABNORMAL LOW (ref 3.5–5.0)
Anion gap: 12 (ref 5–15)
BUN: 21 mg/dL — AB (ref 6–20)
CALCIUM: 8.6 mg/dL — AB (ref 8.9–10.3)
CO2: 28 mmol/L (ref 22–32)
CREATININE: 5.05 mg/dL — AB (ref 0.61–1.24)
Chloride: 97 mmol/L — ABNORMAL LOW (ref 101–111)
GFR, EST AFRICAN AMERICAN: 13 mL/min — AB (ref >60)
GFR, EST NON AFRICAN AMERICAN: 12 mL/min — AB (ref >60)
Glucose, Bld: 104 mg/dL — ABNORMAL HIGH (ref 65–99)
Potassium: 3.9 mmol/L (ref 3.5–5.1)
Sodium: 137 mmol/L (ref 135–145)
Total Bilirubin: 0.8 mg/dL (ref 0.3–1.2)
Total Protein: 7.6 g/dL (ref 6.5–8.1)

## 2016-11-08 LAB — PROTIME-INR
INR: 1.3
PROTHROMBIN TIME: 16.1 s — AB (ref 11.4–15.2)

## 2016-11-08 LAB — I-STAT CG4 LACTIC ACID, ED: LACTIC ACID, VENOUS: 1.78 mmol/L (ref 0.5–1.9)

## 2016-11-08 MED ORDER — ACETAMINOPHEN 325 MG PO TABS
650.0000 mg | ORAL_TABLET | Freq: Once | ORAL | Status: AC
  Administered 2016-11-08: 650 mg via ORAL

## 2016-11-08 MED ORDER — ACETAMINOPHEN 325 MG PO TABS
ORAL_TABLET | ORAL | Status: AC
  Filled 2016-11-08: qty 2

## 2016-11-08 NOTE — ED Triage Notes (Addendum)
Pt is from home with dental pain which began this am and worsened over the course of the day.  Pt had a fever or 103.6 tympanic for ems.  HR was 104 and BP 149/85, CBG 148.  Pt is on dialysis on MWF and had his treatment this am. Pt also has chronic wound on left leg which he is seen at the wound center for.  Pt reports that "it drains a whole lot but is not changed".

## 2016-11-08 NOTE — ED Notes (Signed)
Pt states that he took aleve a few hours ago.  Pt with generally poor dentition, no oral swelling noted

## 2016-11-09 ENCOUNTER — Encounter: Payer: Self-pay | Admitting: *Deleted

## 2016-11-09 ENCOUNTER — Encounter (HOSPITAL_COMMUNITY): Payer: Self-pay | Admitting: Physician Assistant

## 2016-11-09 ENCOUNTER — Ambulatory Visit: Payer: Medicare HMO | Admitting: Cardiovascular Disease

## 2016-11-09 ENCOUNTER — Other Ambulatory Visit: Payer: Self-pay

## 2016-11-09 ENCOUNTER — Emergency Department (HOSPITAL_COMMUNITY): Payer: Medicare HMO

## 2016-11-09 ENCOUNTER — Observation Stay (HOSPITAL_COMMUNITY)
Admission: EM | Admit: 2016-11-09 | Discharge: 2016-11-09 | Disposition: A | Discharge status: Home / Self Care | Payer: Medicare HMO | Attending: Family Medicine | Admitting: Family Medicine

## 2016-11-09 DIAGNOSIS — F32A Depression, unspecified: Secondary | ICD-10-CM | POA: Diagnosis present

## 2016-11-09 DIAGNOSIS — A419 Sepsis, unspecified organism: Secondary | ICD-10-CM | POA: Diagnosis not present

## 2016-11-09 DIAGNOSIS — Z720 Tobacco use: Secondary | ICD-10-CM | POA: Diagnosis present

## 2016-11-09 DIAGNOSIS — N186 End stage renal disease: Secondary | ICD-10-CM | POA: Diagnosis not present

## 2016-11-09 DIAGNOSIS — L97529 Non-pressure chronic ulcer of other part of left foot with unspecified severity: Secondary | ICD-10-CM | POA: Diagnosis not present

## 2016-11-09 DIAGNOSIS — I10 Essential (primary) hypertension: Secondary | ICD-10-CM | POA: Diagnosis present

## 2016-11-09 DIAGNOSIS — D638 Anemia in other chronic diseases classified elsewhere: Secondary | ICD-10-CM | POA: Diagnosis not present

## 2016-11-09 DIAGNOSIS — E1143 Type 2 diabetes mellitus with diabetic autonomic (poly)neuropathy: Secondary | ICD-10-CM | POA: Diagnosis present

## 2016-11-09 DIAGNOSIS — K2960 Other gastritis without bleeding: Secondary | ICD-10-CM | POA: Diagnosis present

## 2016-11-09 DIAGNOSIS — K0889 Other specified disorders of teeth and supporting structures: Secondary | ICD-10-CM

## 2016-11-09 DIAGNOSIS — K219 Gastro-esophageal reflux disease without esophagitis: Secondary | ICD-10-CM | POA: Diagnosis present

## 2016-11-09 DIAGNOSIS — E118 Type 2 diabetes mellitus with unspecified complications: Secondary | ICD-10-CM | POA: Diagnosis not present

## 2016-11-09 DIAGNOSIS — F329 Major depressive disorder, single episode, unspecified: Secondary | ICD-10-CM | POA: Diagnosis present

## 2016-11-09 DIAGNOSIS — M353 Polymyalgia rheumatica: Secondary | ICD-10-CM | POA: Diagnosis present

## 2016-11-09 DIAGNOSIS — E785 Hyperlipidemia, unspecified: Secondary | ICD-10-CM | POA: Diagnosis present

## 2016-11-09 DIAGNOSIS — E559 Vitamin D deficiency, unspecified: Secondary | ICD-10-CM | POA: Diagnosis present

## 2016-11-09 DIAGNOSIS — R509 Fever, unspecified: Secondary | ICD-10-CM | POA: Diagnosis not present

## 2016-11-09 DIAGNOSIS — E089 Diabetes mellitus due to underlying condition without complications: Secondary | ICD-10-CM | POA: Diagnosis present

## 2016-11-09 LAB — HEMOGLOBIN A1C
Hgb A1c MFr Bld: 6.4 % — ABNORMAL HIGH (ref 4.8–5.6)
Mean Plasma Glucose: 136.98 mg/dL

## 2016-11-09 LAB — GLUCOSE, CAPILLARY: GLUCOSE-CAPILLARY: 98 mg/dL (ref 65–99)

## 2016-11-09 LAB — LACTIC ACID, PLASMA: Lactic Acid, Venous: 2.1 mmol/L (ref 0.5–1.9)

## 2016-11-09 LAB — INFLUENZA PANEL BY PCR (TYPE A & B)
Influenza A By PCR: NEGATIVE
Influenza B By PCR: NEGATIVE

## 2016-11-09 MED ORDER — ONDANSETRON HCL 4 MG PO TABS: 4.0000 mg | ORAL_TABLET | Freq: Four times a day (QID) | ORAL | Status: DC | PRN

## 2016-11-09 MED ORDER — ATORVASTATIN CALCIUM 40 MG PO TABS
40.0000 mg | ORAL_TABLET | Freq: Every day | ORAL | Status: DC
  Filled 2016-11-09: qty 1

## 2016-11-09 MED ORDER — SEVELAMER CARBONATE 800 MG PO TABS
800.0000 mg | ORAL_TABLET | Freq: Three times a day (TID) | ORAL | Status: DC | PRN
  Filled 2016-11-09: qty 1

## 2016-11-09 MED ORDER — MONTELUKAST SODIUM 10 MG PO TABS
10.0000 mg | ORAL_TABLET | Freq: Every day | ORAL | Status: DC
  Filled 2016-11-09: qty 1

## 2016-11-09 MED ORDER — SEVELAMER CARBONATE 800 MG PO TABS
2400.0000 mg | ORAL_TABLET | Freq: Three times a day (TID) | ORAL | Status: DC
  Filled 2016-11-09 (×2): qty 4

## 2016-11-09 MED ORDER — HEPARIN SODIUM (PORCINE) 5000 UNIT/ML IJ SOLN
5000.0000 [IU] | Freq: Three times a day (TID) | INTRAMUSCULAR | Status: DC
  Filled 2016-11-09: qty 1

## 2016-11-09 MED ORDER — ASPIRIN EC 81 MG PO TBEC: 81.0000 mg | DELAYED_RELEASE_TABLET | Freq: Every day | ORAL | Status: DC

## 2016-11-09 MED ORDER — GABAPENTIN 300 MG PO CAPS: 300.0000 mg | ORAL_CAPSULE | Freq: Three times a day (TID) | ORAL | Status: DC

## 2016-11-09 MED ORDER — CLINDAMYCIN HCL 300 MG PO CAPS: 300.0000 mg | ORAL_CAPSULE | Freq: Three times a day (TID) | ORAL | 0 refills | Status: DC

## 2016-11-09 MED ORDER — PIPERACILLIN-TAZOBACTAM 3.375 G IVPB: 3.3750 g | Freq: Two times a day (BID) | INTRAVENOUS | Status: DC

## 2016-11-09 MED ORDER — TEMAZEPAM 30 MG PO CAPS: 30.0000 mg | ORAL_CAPSULE | Freq: Every day | ORAL | Status: DC

## 2016-11-09 MED ORDER — DOXERCALCIFEROL 4 MCG/2ML IV SOLN: 2.0000 ug | INTRAVENOUS | Status: DC

## 2016-11-09 MED ORDER — VANCOMYCIN HCL IN DEXTROSE 1-5 GM/200ML-% IV SOLN
1000.0000 mg | INTRAVENOUS | Status: DC
Start: 2016-11-10 — End: 2016-11-09

## 2016-11-09 MED ORDER — ACETAMINOPHEN 650 MG RE SUPP: 650.0000 mg | Freq: Four times a day (QID) | RECTAL | Status: DC | PRN

## 2016-11-09 MED ORDER — VANCOMYCIN HCL IN DEXTROSE 1-5 GM/200ML-% IV SOLN
1000.0000 mg | Freq: Once | INTRAVENOUS | Status: DC
  Filled 2016-11-09: qty 200

## 2016-11-09 MED ORDER — ASPIRIN 81 MG PO TBEC: 81.0000 mg | DELAYED_RELEASE_TABLET | Freq: Every day | ORAL | Status: DC

## 2016-11-09 MED ORDER — PRO-STAT SUGAR FREE PO LIQD
30.0000 mL | Freq: Two times a day (BID) | ORAL | Status: DC
  Filled 2016-11-09: qty 30

## 2016-11-09 MED ORDER — VANCOMYCIN HCL 10 G IV SOLR
1500.0000 mg | Freq: Once | INTRAVENOUS | Status: AC
  Administered 2016-11-09: 1500 mg via INTRAVENOUS
  Filled 2016-11-09: qty 1500

## 2016-11-09 MED ORDER — ACETAMINOPHEN 325 MG PO TABS
650.0000 mg | ORAL_TABLET | Freq: Once | ORAL | Status: AC
  Administered 2016-11-09: 650 mg via ORAL
  Filled 2016-11-09: qty 2

## 2016-11-09 MED ORDER — ACETAMINOPHEN 500 MG PO TABS: 1000.0000 mg | ORAL_TABLET | Freq: Once | ORAL | Status: DC

## 2016-11-09 MED ORDER — INSULIN ASPART 100 UNIT/ML ~~LOC~~ SOLN: 0.0000 [IU] | Freq: Three times a day (TID) | SUBCUTANEOUS | Status: DC

## 2016-11-09 MED ORDER — PIPERACILLIN-TAZOBACTAM 3.375 G IVPB 30 MIN
3.3750 g | Freq: Once | INTRAVENOUS | Status: AC
  Administered 2016-11-09: 3.375 g via INTRAVENOUS
  Filled 2016-11-09: qty 50

## 2016-11-09 MED ORDER — SENNOSIDES-DOCUSATE SODIUM 8.6-50 MG PO TABS: 1.0000 | ORAL_TABLET | Freq: Every evening | ORAL | Status: DC | PRN

## 2016-11-09 MED ORDER — RENA-VITE PO TABS
1.0000 | ORAL_TABLET | Freq: Every day | ORAL | Status: DC
  Filled 2016-11-09: qty 1

## 2016-11-09 MED ORDER — ACETAMINOPHEN 325 MG PO TABS: 650.0000 mg | ORAL_TABLET | Freq: Four times a day (QID) | ORAL | Status: DC | PRN

## 2016-11-09 MED ORDER — CARVEDILOL 12.5 MG PO TABS: 25.0000 mg | ORAL_TABLET | Freq: Two times a day (BID) | ORAL | Status: DC

## 2016-11-09 MED ORDER — DARBEPOETIN ALFA 200 MCG/0.4ML IJ SOSY: 200.0000 ug | PREFILLED_SYRINGE | INTRAMUSCULAR | Status: DC

## 2016-11-09 MED ORDER — ONDANSETRON HCL 4 MG/2ML IJ SOLN: 4.0000 mg | Freq: Four times a day (QID) | INTRAMUSCULAR | Status: DC | PRN

## 2016-11-09 MED ORDER — AMLODIPINE BESYLATE 5 MG PO TABS: 5.0000 mg | ORAL_TABLET | Freq: Every day | ORAL | Status: DC

## 2016-11-09 MED ORDER — BISACODYL 10 MG RE SUPP: 10.0000 mg | Freq: Every day | RECTAL | Status: DC | PRN

## 2016-11-09 MED ORDER — CLOPIDOGREL BISULFATE 75 MG PO TABS
75.0000 mg | ORAL_TABLET | Freq: Every day | ORAL | Status: DC
  Filled 2016-11-09: qty 1

## 2016-11-09 NOTE — ED Provider Notes (Signed)
[facility] EMERGENCY DEPARTMENT Provider Note   CSN: [id number] Arrival date & time: 11/08/16  2159     History   Chief Complaint Chief Complaint  Patient presents with  . Dental Pain  . Wound Check    chronic leg wound  . Fever    HPI Matthew Kline is a 56 y.o. male who presents with a fever. PMH significant for ESRD on dialysis M,W,F (last dialyzed yesterday), CAD s/p CABG, CHF with preserved EF, Type 2 DM with peripheral neuropathy, PAD, anemia, hx of CVA, chronic left foot ulcer, hx of osteomyelitis, current smoker. He states that he started to have a fever and chills yesterday after dialysis. He reports associated runny nose, right lower dental pain, and a mild cough. He also has a chronic left plantar foot wound since May 2018 which is being cared for by wound care. He reports some odor and drainage from the area which is chronic but unchanged. He denies sore throat, facial swelling, difficulty swallowing, headache, chest pain, SOB, abdominal pain, N/V/D, urinary symptoms. He denies sick contacts. He is a current smoker. He has had his flu shot this year.  Nephrologist is Dr. Lorrene Reid.   HPI  Past Medical History:  Diagnosis Date  . Allergy   . Anemia, unspecified   . Anxiety   . Arthralgia 2010   polyarticular  . Arthritis    "back, knees" (05/09/2014)  . CHF (congestive heart failure) ([place])   . CHF (congestive heart failure) ([place]) 07/25/2009   denies  . Chronic lower back pain   . Coronary artery disease   . Coughing    pt. reports that he has drainage from sinus infection  . Diabetic neuropathy ([place])   . ESRD (end stage renal disease) on dialysis [facility])    started 12/2012; "MWF; [place] "   . GERD (gastroesophageal reflux disease)    hx "before I lost weight", no problem 9 years  . Hemodialysis access site with mature fistula ([place])   . Hemorrhoids, internal 10/2011   small  . History of blood transfusion    "related to the  anemia"  . Hypertension   . Insomnia, unspecified   . Knee pain, left   . Lacunar infarction ([place]) 2006   RUE/RLE, speech  . Long term (current) use of anticoagulants   . Myocardial infarction ([place]) 1995  . Orthostatic hypotension   . Osteomyelitis of foot, left, acute ([place])   . Other chronic postoperative pain   . Pneumonia    "probably 4-5 times" (05/09/2014)  . Polymyalgia rheumatica ([place])   . Renal insufficiency   . Sleep apnea    "lost weight; no more problem" (05/09/2014)  . Stroke ([place]) 01/10/06   denies residual on 05/09/2014  . Type II diabetes mellitus ([place]) dx'd 1995  . Ulcer    diabetic foot   . Unspecified hereditary and idiopathic peripheral neuropathy    feet  . Unspecified osteomyelitis, site unspecified   . Unspecified vitamin D deficiency     Patient Active Problem List   Diagnosis Date Noted  . Critical lower limb ischemia 08/25/2016  . Ulcer of left midfoot with fat layer exposed ([place]) 08/13/2016  . Diabetic ulcer of left midfoot associated with type 2 diabetes mellitus, with fat layer exposed ([place]) 07/25/2016  . Peripheral neuropathy 07/22/2016  . Tobacco abuse 07/22/2016  . CAD in native artery 05/18/2016  . CAD, multiple vessel 05/11/2016  . Positive cardiac stress test 05/11/2016  . Abnormal  stress test 04/30/2016  . Pre-transplant evaluation for kidney transplant 04/30/2016  . S/P revision of total knee 11/26/2015  . Pain in the chest   . Acute on chronic diastolic heart failure ([place]) 07/05/2015  . Volume overload 07/04/2015  . Shortness of breath 07/04/2015  . Hypoxemia 07/04/2015  . Elevated troponin   . End-stage renal disease on hemodialysis ([place])   . Hypervolemia   . 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 Septic joint of left knee joint ([place]) 08/07/2014  . Tachycardia 07/24/2014  . Acute upper respiratory infection 07/24/2014  . Sepsis ([place]) 07/14/2014  . ESRD (end stage renal disease) ([place]) 07/14/2014  . Type II  diabetes mellitus ([place]) 07/14/2014  . Anemia in chronic kidney disease 07/14/2014  . Infection of total knee replacement ([place]) 05/31/2014  . Surgical wound dehiscence 05/09/2014  . Dehiscence of closure of skin 05/09/2014  . Total knee replacement status 04/10/2014  . Diabetes mellitus with renal manifestations, controlled ([place]) 10/24/2013  . Hypertensive renal disease 06/27/2013  . DM type 2 causing vascular disease ([place]) 06/27/2013  . Erectile dysfunction 06/27/2013  . Depression 06/27/2013  . Claudication of left lower extremity ([place]) 12/19/2012  . Essential hypertension, benign 12/19/2012  . Sinusitis, acute maxillary 11/22/2012  . Otitis, externa, infective 11/14/2012  . Leg edema, left 11/14/2012  . End stage renal disease ([place]) 10/02/2012  . Controlled type 2 DM with proteinuria or microalbuminuria 09/19/2012  . GERD (gastroesophageal reflux disease) 09/19/2012  . Leukocytosis, unspecified 09/19/2012  . Lacunar infarction 08/17/2012  . Polymyalgia rheumatica ([place]) 08/17/2012  . 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 Unspecified essential hypertension 05/10/2012  . Unspecified vitamin D deficiency 05/10/2012  . Diabetes mellitus due to underlying condition ([place]) 05/10/2012  . Hyperlipidemia LDL goal <100 05/10/2012  . Anemia of chronic disease 05/10/2012  . Screening for prostate cancer 05/10/2012  . Chronic kidney disease (CKD), stage IV (severe) ([place]) 05/10/2012  . Peripheral autonomic neuropathy due to DM ([place]) 05/10/2012  . Callus of foot 05/10/2012  . Urgency of urination 05/10/2012  . Hyperkalemia 05/10/2012  . Candidiasis of the esophagus 10/12/2011  . Internal hemorrhoids without mention of complication 51/70/0174  . Pre-syncope 07/25/2009  . DJD (degenerative joint disease) of cervical spine 02/17/2009    Past Surgical History:  Procedure Laterality Date  . ABDOMINAL AORTOGRAM N/A 08/25/2016   Procedure: ABDOMINAL AORTOGRAM;  Surgeon: Wellington Hampshire, MD;   Location: [place] [facility];  Service: Cardiovascular;  Laterality: N/A;  . AMPUTATION  01/21/2012   Procedure: AMPUTATION RAY;  Surgeon: [name], MD;  Location: [employer];  Service: Orthopedics;  Laterality: Left;  Left Foot 4th Ray Amputation  . AMPUTATION Left 05/04/2013   Procedure: AMPUTATION DIGIT;  Surgeon: [name], MD;  Location: [place];  Service: Orthopedics;  Laterality: Left;  Left Great Toe Amputation at [facility]  . ANTERIOR CERVICAL DECOMP/DISCECTOMY FUSION  02/2011  . BASCILIC VEIN TRANSPOSITION Left 10/19/2012   Procedure: BASCILIC VEIN TRANSPOSITION;  Surgeon: Serafina Mitchell, MD;  Location: [place];  Service: Vascular;  Laterality: Left;  . CORONARY ARTERY BYPASS GRAFT     x 5 with lima at [place] WITH ANTIBIOTIC SPACERS Left 08/07/2014   Procedure: Replace Left Total Knee Arthroplasty,  Place Antibiotic Spacer;  Surgeon: [name], MD;  Location: [place];  Service: Orthopedics;  Laterality: Left;  . I&D EXTREMITY Left 05/09/2014   Procedure: Irrigation and Debridement Left Knee and Closure  of Total Knee Arthroplasty Incision;  Surgeon: [name], MD;  Location: [place];  Service: Orthopedics;  Laterality: Left;  . I&D KNEE WITH POLY EXCHANGE Left 05/31/2014   Procedure: IRRIGATION AND DEBRIDEMENT LEFT KNEE, PLACE ANTIBIOTIC BEADS,  POLY EXCHANGE;  Surgeon: [name], MD;  Location: [place];  Service: Orthopedics;  Laterality: Left;  . KNEE ARTHROSCOPY Left 08-25-2012  . LOWER EXTREMITY ANGIOGRAPHY Left 08/25/2016   Procedure: Lower Extremity Angiography;  Surgeon: Wellington Hampshire, MD;  Location: [place] [facility];  Service: Cardiovascular;  Laterality: Left;  . PERIPHERAL VASCULAR BALLOON ANGIOPLASTY Left 08/25/2016   Procedure: PERIPHERAL VASCULAR BALLOON ANGIOPLASTY;  Surgeon: Wellington Hampshire, MD;  Location: [place] [facility];  Service: Cardiovascular;  Laterality: Left;  lt peroneal and ant tibial arteries cutting balloon  .  REFRACTIVE SURGERY Bilateral   . TOE AMPUTATION Bilateral    "I've lost 7 toes over the last 7 years" (05/09/2014)  . TOE SURGERY Left April 2015   Big toe removed on left foot.  . TONSILLECTOMY    . TOTAL KNEE ARTHROPLASTY Left 04/10/2014   Procedure: TOTAL KNEE ARTHROPLASTY;  Surgeon: [name], MD;  Location: [place];  Service: Orthopedics;  Laterality: Left;  . TOTAL KNEE REVISION Left 10/25/2014   Procedure: LEFT TOTAL KNEE REVISION;  Surgeon: [name], MD;  Location: [place];  Service: Orthopedics;  Laterality: Left;  . TOTAL KNEE REVISION Left 11/26/2015   Procedure: Removal Left Total Knee Arthroplasty, Hinged Total Knee Arthroplasty;  Surgeon: [name], MD;  Location: [place];  Service: Orthopedics;  Laterality: Left;  . UVULOPALATOPHARYNGOPLASTY, TONSILLECTOMY AND SEPTOPLASTY  ~ 1989  . WOUND DEBRIDEMENT Left 05/09/2014   Dehiscence Left Total Knee Arthroplasty Incision       Home Medications    Prior to Admission medications   Medication Sig Start Date End Date Taking? Authorizing Provider  amLODipine (NORVASC) 5 MG tablet Take 5 mg by mouth at bedtime.    [provider]  aspirin 81 MG EC tablet TAKE 1 TABLET EVERY DAY Patient taking differently: Take 162 mg by mouth daily 08/05/15   Gildardo Cranker, DO  atorvastatin (LIPITOR) 40 MG tablet Take 40 mg by mouth daily at 6 PM.    [provider]  carvedilol (COREG) 25 MG tablet Take 25 mg by mouth 2 (two) times daily with a meal.    [provider]  clopidogrel (PLAVIX) 75 MG tablet Take 1 tablet (75 mg total) by mouth daily. 08/25/16 08/25/17  Wellington Hampshire, MD  gabapentin (NEURONTIN) 300 MG capsule TAKE ONE CAPSULE BY MOUTH 3 TIMES A DAY Patient taking differently: TAKE ONE CAPSULE BY MOUTH DAILY IN THE EVENING 02/20/16   Suzan Slick, NP  montelukast (SINGULAIR) 10 MG tablet TAKE 1 TABLET (10 MG TOTAL) BY MOUTH AT BEDTIME. 09/01/15   Gildardo Cranker, DO  sevelamer carbonate (RENVELA) 800 MG  tablet Take 2,400-3,200 mg by mouth 3 (three) times daily with meals. Take 2400 to 3200 mg with each me (based on the size of the meal) 3 times daily and take 800 mg with large snacks    [provider]  temazepam (RESTORIL) 30 MG capsule Take 30 mg by mouth at bedtime.  11/10/15   [provider]    Family History Family History  Problem Relation Age of Onset  . Hypertension Mother   . Cancer Mother 94       Ovarian  . Heart disease Maternal  Aunt   . Stroke Maternal Grandfather     Social History Social History  Substance Use Topics  . Smoking status: Former Smoker    Packs/day: 0.12    Years: 32.00    Types: Cigarettes    Quit date: 05/28/2016  . Smokeless tobacco: Former Systems developer    Quit date: 10/06/2014     Comment: 3 -4 cig daily   . Alcohol use No     Allergies   Morphine and related and Tygacil [tigecycline]   Review of Systems Review of Systems  Constitutional: Positive for chills and fever.  HENT: Positive for dental problem and rhinorrhea. Negative for congestion, facial swelling and sore throat.   Respiratory: Positive for cough. Negative for shortness of breath.   Cardiovascular: Negative for chest pain.  Gastrointestinal: Negative for abdominal pain, diarrhea, nausea and vomiting.  Genitourinary: Negative for dysuria and frequency.  Skin: Positive for wound.  Neurological: Negative for headaches.  All other systems reviewed and are negative.    Physical Exam Updated Vital Signs BP (!) 146/94   Pulse (!) 114   Temp 99.6 F (37.6 C) (Oral)   Resp (!) 28   Ht 5\' 11"  (1.803 m)   Wt 86.4 kg (190 lb 7.6 oz)   SpO2 100%   BMI 26.57 kg/m   Physical Exam  Constitutional: He is oriented to person, place, and time. He appears distressed ([place]).  Chronically ill appearing  HENT:  Head: Normocephalic and atraumatic.  Right Ear: Hearing, tympanic membrane, external ear and ear canal normal.  Left Ear: Hearing, external ear and ear canal  normal.  Mouth/Throat: Oropharynx is clear and moist and mucous membranes are normal. No trismus in the jaw. Abnormal dentition. Dental caries present. No dental abscesses.  Left TM cerumen impaction  Right lower molar is decayed without obvious infection. No facial swelling or evidence of abscess  Eyes: Pupils are equal, round, and reactive to light. Conjunctivae are normal. Right eye exhibits no discharge. Left eye exhibits no discharge. No scleral icterus.  Neck: Normal range of motion.  Cardiovascular: Regular rhythm.  Tachycardia present.  Exam reveals no gallop and no friction rub.   No murmur heard. Right upper extremity AVF has palpable thrill without redness or drainage  Pulmonary/Chest: Effort normal. No respiratory distress. He has wheezes (mild). He has no rales. He exhibits no tenderness.  Abdominal: Soft. Bowel sounds are normal. He exhibits no distension. There is no tenderness.  Musculoskeletal:  2+ edema of RLE and 3+ edema of LLE. Large chronic wound over plantar aspect of left foot near heel. Minimal odor and drainage from foot. No surrounding erythema or erythema extending up foot. Multiple toe amputations  Neurological: He is alert and oriented to person, place, and time.  Skin: Skin is warm and dry.  Psychiatric: He has a normal mood and affect. His behavior is normal.  Nursing note and vitals reviewed.      ED Treatments / Results  Labs (all labs ordered are listed, but only abnormal results are displayed) Labs Reviewed  COMPREHENSIVE METABOLIC PANEL - Abnormal; Notable for the following:       Result Value   Chloride 97 (*)    Glucose, Bld 104 (*)    BUN 21 (*)    Creatinine, Ser 5.05 (*)    Calcium 8.6 (*)    Albumin 2.8 (*)    ALT 9 (*)    GFR calc non Af Amer 12 (*)    GFR calc Af Wyvonnia Lora  13 (*)    All other components within normal limits  CBC WITH DIFFERENTIAL/PLATELET - Abnormal; Notable for the following:    RBC 3.38 (*)    Hemoglobin 9.3 (*)     HCT 30.8 (*)    RDW 19.0 (*)    Lymphs Abs 0.5 (*)    All other components within normal limits  PROTIME-INR - Abnormal; Notable for the following:    Prothrombin Time 16.1 (*)    All other components within normal limits  CULTURE, BLOOD (ROUTINE X 2)  CULTURE, BLOOD (ROUTINE X 2)  URINALYSIS, ROUTINE W REFLEX MICROSCOPIC  INFLUENZA PANEL BY PCR (TYPE A & B)  I-STAT CG4 LACTIC ACID, ED  I-STAT CG4 LACTIC ACID, ED    EKG  EKG Interpretation None       Radiology Dg Chest 2 View  Result Date: 11/08/2016 CLINICAL DATA:  Fever and cough EXAM: CHEST  2 VIEW COMPARISON:  Chest radiograph 07/04/2016 FINDINGS: Cardiomegaly. Status post median sternotomy for coronary artery bypass graft. There is central pulmonary vascular congestion without overt pulmonary edema. No pleural effusion or pneumothorax. No focal consolidation. IMPRESSION: Pulmonary vascular congestion and cardiomegaly without overt edema. Bilateral parahilar nodular opacities are favored to be vessels seen on end. Nonemergent chest CT could be considered for better characterization. Electronically Signed   By: Ulyses Jarred M.D.   On: 11/08/2016 22:53    Procedures Procedures (including critical care time)  Medications Ordered in ED Medications  acetaminophen (TYLENOL) 325 MG tablet (not administered)  piperacillin-tazobactam (ZOSYN) IVPB 3.375 g (3.375 g Intravenous New Bag/Given 11/09/16 0815)  vancomycin (VANCOCIN) 1,500 mg in sodium chloride 0.9 % 500 mL IVPB (not administered)  piperacillin-tazobactam (ZOSYN) IVPB 3.375 g (not administered)  vancomycin (VANCOCIN) IVPB 1000 mg/200 mL premix (not administered)  acetaminophen (TYLENOL) tablet 650 mg (650 mg Oral Given 11/08/16 2222)  acetaminophen (TYLENOL) tablet 650 mg (650 mg Oral Given 11/09/16 0800)     Initial Impression / Assessment and Plan / ED Course  I have reviewed the triage vital signs and the nursing notes.  Pertinent labs & imaging results that  were available during my care of the patient were reviewed by me and considered in my medical decision making (see chart for details).  56 year old male presents with sepsis due to unknown source at this time. He is hypertensive, febrile with Tmax 102.1 in the ED, and tachycardic. There is some transient hypoxia on the monitor however there is a poor wave form when this occurs and sats quickly go back to >95%. Differential includes line sepsis, respiratory infection/influenza, infection of his chronic plantar foot wound, dental infection/abscess. Foot wound appears to be healing on review of EMR so this is less likely. Also he has poor overall dentition but there is no obvious dental infection. Vancomycin and Zosyn were initiated along with Tylenol. Fluids were held since patient appears more volume overloaded, has a normal lactate, and is not hypotensive. The patient states all symptoms abate when his fever goes down. Labs overall are at baseline - CBC shows anemia. CMP is overall unremarkable. Blood cultures were obtained. Flu was sent. UA is pending. CXR shows pulmonary vascular congestion and cardiomegaly without over edema. Shared visit with Dr. Vanita Panda. Spoke with Providence Crosby, PA-C who will come to see patient. Spoke with Dr. Jonnie Finner with Nephrology who will consult on patient.  Final Clinical Impressions(s) / ED Diagnoses   Final diagnoses:  Sepsis, due to unspecified organism [facility])  Ulcer of left foot,  unspecified ulcer stage ([place])  Pain, dental    New Prescriptions New Prescriptions   No medications on file     Iris Pert 11/09/16 0932    Carmin Muskrat, MD 11/09/16 [phone]

## 2016-11-09 NOTE — ED Notes (Signed)
Pt able to ambulated to restroom located in room, tolerated well. Pt stated " I can not pee on demand. When I have to go, I promise I will let you know."

## 2016-11-09 NOTE — ED Notes (Signed)
Patient eating lunch at this time.

## 2016-11-09 NOTE — ED Notes (Signed)
Patient sitting on side of bed. States that he does not want to be admitted. States "I want to go home. It's cellulitis. I feel better. Just send me home with some antibiotics, and I'll be fine." Paged Dr. Marily Memos. Discussed situation with MD. States he does not feel comfortable sending him home. He will come and speak with him in ED.

## 2016-11-09 NOTE — ED Notes (Signed)
Called pt placement to inform them of pt d/c.

## 2016-11-09 NOTE — Discharge Instructions (Signed)
The cause of your fever is still unknown. It could be due to a host of possible sources including your foot, mouth, or a viral illness. Please take the clindamycin as prescribed and go to your regular dialysis sessions as instructed. Please return if needed.

## 2016-11-09 NOTE — ED Notes (Signed)
Ordered renal/low carb tray

## 2016-11-09 NOTE — Discharge Summary (Signed)
Physician Discharge Summary  Matthew Kline WPY:[id number] DOB: [birth year] DOA: 11/09/2016  PCP: Donald Prose, MD  Admit date: 11/09/2016 Discharge date: 11/09/2016  Admitted From: home Disposition:  Home  Recommendations for Outpatient Follow-up:  1. Follow up with PCP in 1-2 weeks- 2.  follow up with my chart results 3. Dialysis on 11/10/16  Home Health:no Equipment/Devices:non  Discharge Condition:stable CODE STATUS:full Diet recommendation: regular  Brief/Interim Summary: Matthew Kline is a 56 y.o. male with very extensive medical history listed below including ESRD, CAD status post CABG, CHF with preserved EF, diabetes type 2 with peripheral neuropathy, PAD, anemia, history of CVA, current smoker, presenting with fever up to 102 , chills after his dialysis session yesterday (he dialyzes Monday Wednesday and Friday). He also reports a running nose, but without sick mucous production. He does report taking his flu shots one month ago. He denies any sick contacts. No recent long distance trips He denies any worsening shortness of breath, or chest pain. Of note, the patient reports right lower dental pain, that had started a few days ago, but without face swelling, or purulence in the area. He does have a cavity for a long period of time. He denies any difficulty swallowing, or headaches.He denies any nausea, or vomiting. He denies any abdominal pain. He still makes urine, and denies any suprapubic tenderness, or dysuria or hematuria. In addition, he is complaining of left foot wound, chronic, since May 2018, which is being cared for by wound care, but this area does not have any odor or drainage that is different from prior. He denies any calf pain. He is worried that he is fever may have come from these 2 sources mentioned above.   ED Course:  BP 137/66   Pulse (!) 107   Temp 99.6 F (37.6 C) (Oral)   Resp 18   Ht 5\' 11"  (1.803 m)   Wt 86.4 kg (190 lb 7.6 oz)   SpO2 92%    BMI 26.57 kg/m    Creatinine 5.05 Hemoglobin 9.3 Lesions 0.5, ALT 9 Blood culture pending Urinalysis pending Influenza by PCR negative  Lactic acid 1.78 Chest x-ray shows pulmonary vascular, congestion and cardiomegaly without overt edema Left foot x-ray is negative for acute osseous abnormalities Received vancomycin and Zosyn Received Tylenol for pain control and fever No IV fluids were given in view of ESRD on hemodialysis/fluid overload   Discharge Diagnoses:  Active Problems:   Essential hypertension   Vitamin D deficiency   Diabetes mellitus due to underlying condition ([place])   Hyperlipidemia LDL goal <100   Anemia of chronic disease   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 Pt initially somnolent and confused. Workup unremarkable other than fever. Pt completely awake and aler at 18:00 and demanding to go home. Pt states that he was so confused earlier because he didn't sleep the night prior waiting in the ED. Pt now states that he thinks his foot has been getting more painful since checking into the ED and then got better with the antibiotics. States his sx feel like when he had cellulitis before. Denies any facial swelling or pain from missing tooth but does endorse sinus pain and congestion.   ESRD: no dialysis during short admission. Pt will go to dialysis on 11/10/16  Encouraged pt  to stay due to incomplete workup and waiting for morning labs and to allow pt fully respond to therapies initiated in the ED. Pt adament about leaving and will return if needed.   DC on Clindamycin 300 TID for presumed cellulitis. Return precautions discussed.   Discharge Instructions   Allergies as of 11/09/2016      Reactions   Morphine And Related Other (See Comments)   hallucinations    Tygacil [tigecycline] Nausea And Vomiting, Other (See Comments)   Makes him feel crazy      Medication List    TAKE these medications   amLODipine 5 MG tablet Commonly known as:  NORVASC Take 5 mg by mouth at bedtime.   aspirin 81 MG EC tablet TAKE 1 TABLET EVERY DAY What changed:  See the new instructions.   atorvastatin 40 MG tablet Commonly known as:  LIPITOR Take 40 mg by mouth daily at 6 PM.   carvedilol 25 MG tablet Commonly known as:  COREG Take 25 mg by mouth 2 (two) times daily with a meal.   clindamycin 300 MG capsule Commonly known as:  CLEOCIN Take 1 capsule (300 mg total) by mouth 3 (three) times daily.   clopidogrel 75 MG tablet Commonly known as:  PLAVIX Take 1 tablet (75 mg total) by mouth daily.   gabapentin 300 MG capsule Commonly known as:  NEURONTIN TAKE ONE CAPSULE BY MOUTH 3 TIMES A DAY What changed:  See the new instructions.   montelukast 10 MG tablet Commonly known as:  SINGULAIR TAKE 1 TABLET (10 MG TOTAL) BY MOUTH AT BEDTIME.   RENVELA 800 MG tablet Generic drug:  sevelamer carbonate Take 2,400-3,200 mg by mouth 3 (three) times daily with meals. Take 2400 to 3200 mg with each me (based on the size of the meal) 3 times daily and take 800 mg with large snacks   temazepam 30 MG capsule Commonly known as:  RESTORIL Take 30 mg by mouth at bedtime.      Follow-up Information    Donald Prose, MD.   Specialty:  Family Medicine Contact information: [place] [address] [phone]          Allergies  Allergen Reactions  . Morphine And Related Other (See Comments)    hallucinations  . Tygacil [Tigecycline] Nausea And Vomiting and Other (See Comments)    Makes him feel crazy    Consultations: renal  Procedures/Studies: Dg Chest 2 View  Result Date: 11/08/2016 CLINICAL DATA:  Fever and cough EXAM: CHEST  2 VIEW COMPARISON:  Chest radiograph 07/04/2016 FINDINGS: Cardiomegaly. Status post median  sternotomy for coronary artery bypass graft. There is central pulmonary vascular congestion without overt pulmonary edema. No pleural effusion or pneumothorax. No focal consolidation. IMPRESSION: Pulmonary vascular congestion and cardiomegaly without overt edema. Bilateral parahilar nodular opacities are favored to be vessels seen on end. Nonemergent chest CT could be considered for better characterization. Electronically Signed   By: Ulyses Jarred M.D.   On: 11/08/2016 22:53   Dg Foot Complete Left  Result Date: 11/09/2016 CLINICAL DATA:  Chronic foot wounds. EXAM: LEFT FOOT - COMPLETE 3+ VIEW COMPARISON:  Radiographs of July 14, 2016. FINDINGS: Status post surgical amputation of the first toe as well as the fourth distal metatarsal and phalanges. Vascular calcifications are noted. Stable neuropathic changes are seen involving the midfoot. No acute fracture is noted. No lytic destruction is seen to suggest osteomyelitis. IMPRESSION: Stable postsurgical and degenerative changes as described above. No  definite acute abnormality is noted. Electronically Signed   By: Marijo Conception, M.D.   On: 11/09/2016 09:15   (Echo, Carotid, EGD, Colonoscopy, ERCP)    Subjective:   Discharge Exam: Vitals:   11/09/16 1615 11/09/16 1645  BP: 113/69 114/76  Pulse: 80 82  Resp: 19 19  Temp:    SpO2: 92% 94%   Vitals:   11/09/16 1515 11/09/16 1530 11/09/16 1615 11/09/16 1645  BP: 112/73 110/68 113/69 114/76  Pulse: 81 79 80 82  Resp: (!) 23 (!) 23 19 19   Temp:      TempSrc:      SpO2: 95% 95% 92% 94%  Weight:      Height:        General: Pt is alert, awake, not in acute distress Cardiovascular: RRR, S1/S2 +, no rubs, no gallops Respiratory: CTA bilaterally, no wheezing, no rhonchi Abdominal: Soft, NT, ND, bowel sounds + Extremities: no edema, no cyanosis    The results of significant diagnostics from this hospitalization (including imaging, microbiology, ancillary and laboratory) are listed below  for reference.     Microbiology: Recent Results (from the past 240 hour(s))  Culture, blood (Routine x 2)     Status: None (Preliminary result)   Collection Time: 11/08/16 10:00 PM  Result Value Ref Range Status   Specimen Description BLOOD LEFT HAND  Final   Special Requests   Final    BOTTLES DRAWN AEROBIC ONLY Blood Culture results may not be optimal due to an excessive volume of blood received in culture bottles   Culture PENDING  Incomplete   Report Status PENDING  Incomplete     Labs: BNP (last 3 results) No results for input(s): BNP in the last 8760 hours. Basic Metabolic Panel:  Recent Labs Lab 11/08/16 2216  NA 137  K 3.9  CL 97*  CO2 28  GLUCOSE 104*  BUN 21*  CREATININE 5.05*  CALCIUM 8.6*   Liver Function Tests:  Recent Labs Lab 11/08/16 2216  AST 17  ALT 9*  ALKPHOS 111  BILITOT 0.8  PROT 7.6  ALBUMIN 2.8*   No results for input(s): LIPASE, AMYLASE in the last 168 hours. No results for input(s): AMMONIA in the last 168 hours. CBC:  Recent Labs Lab 11/08/16 2216  WBC 6.4  NEUTROABS 5.6  HGB 9.3*  HCT 30.8*  MCV 91.1  PLT 158   Cardiac Enzymes: No results for input(s): CKTOTAL, CKMB, CKMBINDEX, TROPONINI in the last 168 hours. BNP: Invalid input(s): POCBNP CBG:  Recent Labs Lab 11/09/16 1251  GLUCAP 98   D-Dimer No results for input(s): DDIMER in the last 72 hours. Hgb A1c  Recent Labs  11/08/16 1313  HGBA1C 6.4*   Lipid Profile No results for input(s): CHOL, HDL, LDLCALC, TRIG, CHOLHDL, LDLDIRECT in the last 72 hours. Thyroid function studies No results for input(s): TSH, T4TOTAL, T3FREE, THYROIDAB in the last 72 hours.  Invalid input(s): FREET3 Anemia work up No results for input(s): VITAMINB12, FOLATE, FERRITIN, TIBC, IRON, RETICCTPCT in the last 72 hours. Urinalysis    Component Value Date/Time   COLORURINE YELLOW 01/03/2012 1335   APPEARANCEUR Clear 07/12/2013 0918   LABSPEC 1.021 01/03/2012 1335   PHURINE 6.0  01/03/2012 1335   GLUCOSEU Trace (A) 07/12/2013 0918   HGBUR TRACE (A) 01/03/2012 1335   BILIRUBINUR Negative 07/12/2013 0918   KETONESUR NEGATIVE 01/03/2012 1335   PROTEINUR 3+ (A) 07/12/2013 0918   PROTEINUR >300 (A) 01/03/2012 1335   UROBILINOGEN 0.2 01/03/2012 1335  NITRITE Negative 07/12/2013 0918   NITRITE NEGATIVE 01/03/2012 1335   LEUKOCYTESUR Negative 07/12/2013 0918   Sepsis Labs Invalid input(s): PROCALCITONIN,  WBC,  LACTICIDVEN Microbiology Recent Results (from the past 240 hour(s))  Culture, blood (Routine x 2)     Status: None (Preliminary result)   Collection Time: 11/08/16 10:00 PM  Result Value Ref Range Status   Specimen Description BLOOD LEFT HAND  Final   Special Requests   Final    BOTTLES DRAWN AEROBIC ONLY Blood Culture results may not be optimal due to an excessive volume of blood received in culture bottles   Culture PENDING  Incomplete   Report Status PENDING  Incomplete     Time coordinating discharge: Over 30 minutes  SIGNED:   Waldemar Dickens, MD  [facility] 11/09/2016, 6:35 PM Pager   If 7PM-7AM, please contact night-coverage [email or web address] Password [email or web address]

## 2016-11-09 NOTE — Consult Note (Signed)
[facility] Renal Consultation Note    Indication for Consultation:  Management of ESRD/hemodialysis; anemia, hypertension/volume and secondary hyperparathyroidism PCP:  HPI: Matthew Kline is a 56 y.o. male with ESRD on HD MWF. ESRD secondary to DM, HD started 12/2012. PMH includes DM, peripheral neuropathy, HTN, CAD s/p 3V CABG,  PAD s/p toe amputations, anxiety. Has chronic L diabetic foot ulcer that has been managed by Dr. Jacqualyn Posey in podiatry for several months. Last round of Vanc/Fortaz completed 10/19. Outpatient deep wound culture done 10/30/16 was neg.   Presented to ED with fever, chills and dental pain Monday evening. Per ED triage notes temp 103.6F on arrival with tachynpea and tachycardia. No leukocytosis WBC 6.4, lactic acid 1.78.  L foot xray with degenerative changes, no acute abnormalities. CXR with pulmonary vascular congestion and bilat perihilar nodular opacities. Blood cultures drawn, Empiric antibiotics started. Admitted under observation status.   Seen in ED. Main complaint is worsening L foot pain, says it started while he was waiting in the ED. He did not come in with foot pain. Says L foot ulcer has been stable, endorses serous drainage, but this has been ongoing. Has had some mild cough, congestion. Says tooth pain has improved. Denies CP, SOB, abdominal pain, N,V,D.    Dialyzes at [facility] MWF. Last HD Monday on schedule and completed full treatment. No fever noted.   Past Medical History:  Diagnosis Date  . Allergy   . Anemia, unspecified   . Anxiety   . Arthralgia 2010   polyarticular  . Arthritis    "back, knees" (05/09/2014)  . CHF (congestive heart failure) ([place])   . CHF (congestive heart failure) ([place]) 07/25/2009   denies  . Chronic lower back pain   . Coronary artery disease   . Coughing    pt. reports that he has drainage from sinus infection  . Diabetic neuropathy ([place])   . ESRD (end stage renal disease) on dialysis [facility])    started  12/2012; "MWF; [place] "   . GERD (gastroesophageal reflux disease)    hx "before I lost weight", no problem 9 years  . Hemodialysis access site with mature fistula ([place])   . Hemorrhoids, internal 10/2011   small  . History of blood transfusion    "related to the anemia"  . Hypertension   . Insomnia, unspecified   . Knee pain, left   . 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 Long term (current) use of anticoagulants   . Myocardial infarction ([place]) 1995  . Orthostatic hypotension   . Osteomyelitis of foot, left, acute ([place])   . Other chronic postoperative pain   . Pneumonia    "probably 4-5 times" (05/09/2014)  . Polymyalgia rheumatica ([place])   . Renal insufficiency   . Sleep apnea    "lost weight; no more problem" (05/09/2014)  . Stroke ([place]) 01/10/06   denies residual on 05/09/2014  . Type II diabetes mellitus ([place]) dx'd 1995  . Ulcer    diabetic foot   . Unspecified hereditary and idiopathic peripheral neuropathy    feet  . Unspecified osteomyelitis, site unspecified   . Unspecified vitamin D deficiency    Past Surgical History:  Procedure Laterality Date  . ABDOMINAL AORTOGRAM N/A 08/25/2016   Procedure: ABDOMINAL AORTOGRAM;  Surgeon: Wellington Hampshire, MD;  Location: [place] [facility];  Service: Cardiovascular;  Laterality: N/A;  . AMPUTATION  01/21/2012   Procedure: AMPUTATION RAY;  Surgeon: [name], MD;  Location:  [place] [facility];  Service: Orthopedics;  Laterality: Left;  Left Foot 4th Ray Amputation  . AMPUTATION Left 05/04/2013   Procedure: AMPUTATION DIGIT;  Surgeon: [name], MD;  Location: [place];  Service: Orthopedics;  Laterality: Left;  Left Great Toe Amputation at [facility]  . ANTERIOR CERVICAL DECOMP/DISCECTOMY FUSION  02/2011  . BASCILIC VEIN TRANSPOSITION Left 10/19/2012   Procedure: BASCILIC VEIN TRANSPOSITION;  Surgeon: Serafina Mitchell, MD;  Location: [place];  Service: Vascular;  Laterality: Left;  . CORONARY ARTERY BYPASS GRAFT     x 5 with lima at  [place] WITH ANTIBIOTIC SPACERS Left 08/07/2014   Procedure: Replace Left Total Knee Arthroplasty,  Place Antibiotic Spacer;  Surgeon: [name], MD;  Location: [place];  Service: Orthopedics;  Laterality: Left;  . I&D EXTREMITY Left 05/09/2014   Procedure: Irrigation and Debridement Left Knee and Closure of Total Knee Arthroplasty Incision;  Surgeon: [name], MD;  Location: [place];  Service: Orthopedics;  Laterality: Left;  . I&D KNEE WITH POLY EXCHANGE Left 05/31/2014   Procedure: IRRIGATION AND DEBRIDEMENT LEFT KNEE, PLACE ANTIBIOTIC BEADS,  POLY EXCHANGE;  Surgeon: [name], MD;  Location: [place];  Service: Orthopedics;  Laterality: Left;  . KNEE ARTHROSCOPY Left 08-25-2012  . LOWER EXTREMITY ANGIOGRAPHY Left 08/25/2016   Procedure: Lower Extremity Angiography;  Surgeon: Wellington Hampshire, MD;  Location: [place] [facility];  Service: Cardiovascular;  Laterality: Left;  . PERIPHERAL VASCULAR BALLOON ANGIOPLASTY Left 08/25/2016   Procedure: PERIPHERAL VASCULAR BALLOON ANGIOPLASTY;  Surgeon: Wellington Hampshire, MD;  Location: [place] [facility];  Service: Cardiovascular;  Laterality: Left;  lt peroneal and ant tibial arteries cutting balloon  . REFRACTIVE SURGERY Bilateral   . TOE AMPUTATION Bilateral    "I've lost 7 toes over the last 7 years" (05/09/2014)  . TOE SURGERY Left April 2015   Big toe removed on left foot.  . TONSILLECTOMY    . TOTAL KNEE ARTHROPLASTY Left 04/10/2014   Procedure: TOTAL KNEE ARTHROPLASTY;  Surgeon: [name], MD;  Location: [place];  Service: Orthopedics;  Laterality: Left;  . TOTAL KNEE REVISION Left 10/25/2014   Procedure: LEFT TOTAL KNEE REVISION;  Surgeon: [name], MD;  Location: [place];  Service: Orthopedics;  Laterality: Left;  . TOTAL KNEE REVISION Left 11/26/2015   Procedure: Removal Left Total Knee Arthroplasty, Hinged Total Knee Arthroplasty;  Surgeon: [name], MD;  Location: [place];  Service:  Orthopedics;  Laterality: Left;  . UVULOPALATOPHARYNGOPLASTY, TONSILLECTOMY AND SEPTOPLASTY  ~ 1989  . WOUND DEBRIDEMENT Left 05/09/2014   Dehiscence Left Total Knee Arthroplasty Incision   Family History  Problem Relation Age of Onset  . Hypertension Mother   . Cancer Mother 6       Ovarian  . Heart disease Maternal Aunt   . Stroke Maternal Grandfather    Social History:  reports that he quit smoking about 5 months ago. His smoking use included Cigarettes. He has a 3.84 pack-year smoking history. He quit smokeless tobacco use about 2 years ago. He reports that he does not drink alcohol or use drugs. Allergies  Allergen Reactions  . Morphine And Related Other (See Comments)    hallucinations  . Tygacil [Tigecycline] Nausea And Vomiting and Other (See Comments)    Makes him feel crazy   Prior to Admission medications   Medication Sig Start Date End Date Taking? Authorizing Provider  amLODipine (NORVASC) 5 MG  tablet Take 5 mg by mouth at bedtime.   Yes [provider]  aspirin 81 MG EC tablet TAKE 1 TABLET EVERY DAY Patient taking differently: Take 162 mg by mouth daily 08/05/15  Yes Eulas Post, Monica, DO  atorvastatin (LIPITOR) 40 MG tablet Take 40 mg by mouth daily at 6 PM.   Yes [provider]  carvedilol (COREG) 25 MG tablet Take 25 mg by mouth 2 (two) times daily with a meal.   Yes [provider]  clopidogrel (PLAVIX) 75 MG tablet Take 1 tablet (75 mg total) by mouth daily. 08/25/16 08/25/17 Yes Wellington Hampshire, MD  gabapentin (NEURONTIN) 300 MG capsule TAKE ONE CAPSULE BY MOUTH 3 TIMES A DAY Patient taking differently: TAKE ONE CAPSULE BY MOUTH DAILY IN THE EVENING 02/20/16  Yes Dondra Prader R, NP  montelukast (SINGULAIR) 10 MG tablet TAKE 1 TABLET (10 MG TOTAL) BY MOUTH AT BEDTIME. 09/01/15  Yes Gildardo Cranker, DO  sevelamer carbonate (RENVELA) 800 MG tablet Take 2,400-3,200 mg by mouth 3 (three) times daily with meals. Take 2400 to 3200 mg with each me  (based on the size of the meal) 3 times daily and take 800 mg with large snacks   Yes [provider]  temazepam (RESTORIL) 30 MG capsule Take 30 mg by mouth at bedtime.  11/10/15  Yes [provider]   Current Facility-Administered Medications  Medication Dose Route Frequency Provider Last Rate Last Dose  . acetaminophen (TYLENOL) tablet 650 mg  650 mg Oral Q6H PRN Rondel Jumbo, PA-C       Or  . acetaminophen (TYLENOL) suppository 650 mg  650 mg Rectal Q6H PRN Rondel Jumbo, PA-C      . amLODipine (NORVASC) tablet 5 mg  5 mg Oral QHS Wertman, Sara E, PA-C      . aspirin EC tablet 81 mg  81 mg Oral Daily Waldemar Dickens, MD      . atorvastatin (LIPITOR) tablet 40 mg  40 mg Oral q1800 Rondel Jumbo, PA-C      . bisacodyl (DULCOLAX) suppository 10 mg  10 mg Rectal Daily PRN Rondel Jumbo, PA-C      . carvedilol (COREG) tablet 25 mg  25 mg Oral BID WC Wertman, Sara E, PA-C      . clopidogrel (PLAVIX) tablet 75 mg  75 mg Oral Daily Wertman, Sara E, PA-C      . gabapentin (NEURONTIN) capsule 300 mg  300 mg Oral TID Rondel Jumbo, PA-C      . heparin injection 5,000 Units  5,000 Units Subcutaneous Q8H Wertman, Sara E, PA-C      . insulin aspart (novoLOG) injection 0-9 Units  0-9 Units Subcutaneous TID WC Wertman, Sara E, PA-C      . montelukast (SINGULAIR) tablet 10 mg  10 mg Oral QHS Wertman, Sara E, PA-C      . ondansetron (ZOFRAN) tablet 4 mg  4 mg Oral Q6H PRN Rondel Jumbo, PA-C       Or  . ondansetron (ZOFRAN) injection 4 mg  4 mg Intravenous Q6H PRN Rondel Jumbo, PA-C      . piperacillin-tazobactam (ZOSYN) IVPB 3.375 g  3.375 g Intravenous Q12H Rumbarger, Valeda Malm, RPH      . senna-docusate (Senokot-S) tablet 1 tablet  1 tablet Oral QHS PRN Rondel Jumbo, PA-C      . sevelamer carbonate (RENVELA) tablet 2,400-3,200 mg  2,400-3,200 mg Oral TID WC Wertman, Coralee Pesa, PA-C      .  temazepam (RESTORIL) capsule 30 mg  30 mg Oral QHS Wertman, Sara E, PA-C      .  [START ON 11/10/2016] vancomycin (VANCOCIN) IVPB 1000 mg/200 mL premix  1,000 mg Intravenous Q M,W,F-HD Rumbarger, Valeda Malm, [facility]       Current Outpatient Prescriptions  Medication Sig Dispense Refill  . amLODipine (NORVASC) 5 MG tablet Take 5 mg by mouth at bedtime.    Marland Kitchen aspirin 81 MG EC tablet TAKE 1 TABLET EVERY DAY (Patient taking differently: Take 162 mg by mouth daily) 30 tablet 6  . atorvastatin (LIPITOR) 40 MG tablet Take 40 mg by mouth daily at 6 PM.    . carvedilol (COREG) 25 MG tablet Take 25 mg by mouth 2 (two) times daily with a meal.    . clopidogrel (PLAVIX) 75 MG tablet Take 1 tablet (75 mg total) by mouth daily. 30 tablet 6  . gabapentin (NEURONTIN) 300 MG capsule TAKE ONE CAPSULE BY MOUTH 3 TIMES A DAY (Patient taking differently: TAKE ONE CAPSULE BY MOUTH DAILY IN THE EVENING) 270 capsule 0  . montelukast (SINGULAIR) 10 MG tablet TAKE 1 TABLET (10 MG TOTAL) BY MOUTH AT BEDTIME. 90 tablet 1  . sevelamer carbonate (RENVELA) 800 MG tablet Take 2,400-3,200 mg by mouth 3 (three) times daily with meals. Take 2400 to 3200 mg with each me (based on the size of the meal) 3 times daily and take 800 mg with large snacks    . temazepam (RESTORIL) 30 MG capsule Take 30 mg by mouth at bedtime.   2     ROS: As per HPI otherwise negative.  Physical Exam: Vitals:   11/09/16 1030 11/09/16 1100 11/09/16 1130 11/09/16 1215  BP: 109/72 129/74 117/63 116/66  Pulse:    83  Resp: (!) 23 16 (!) 22 18  Temp:      TempSrc:      SpO2:    95%  Weight:      Height:         General: WDWN male NAD  Head: NCAT sclera not icteric MMM, poor dentition Neck: Supple. No JVD  Lungs:  Breathing is unlabored. Scattered crackles throughout  Heart: RRR 2/6 SEM  Abdomen: soft NT + BS Lower extremities: 3+ LE edema  L>R, L foot warm, mild erythema, wound bandaged, dsg clean  Neuro: A & O  X 3. Moves all extremities spontaneously. Psych:  Responds to questions appropriately with a normal affect. Dialysis  Access: LUE AVF+bruit   Labs: Basic Metabolic Panel:  Recent Labs Lab 11/08/16 2216  NA 137  K 3.9  CL 97*  CO2 28  GLUCOSE 104*  BUN 21*  CREATININE 5.05*  CALCIUM 8.6*   Liver Function Tests:  Recent Labs Lab 11/08/16 2216  AST 17  ALT 9*  ALKPHOS 111  BILITOT 0.8  PROT 7.6  ALBUMIN 2.8*   No results for input(s): LIPASE, AMYLASE in the last 168 hours. No results for input(s): AMMONIA in the last 168 hours. CBC:  Recent Labs Lab 11/08/16 2216  WBC 6.4  NEUTROABS 5.6  HGB 9.3*  HCT 30.8*  MCV 91.1  PLT 158   Cardiac Enzymes: No results for input(s): CKTOTAL, CKMB, CKMBINDEX, TROPONINI in the last 168 hours. CBG: No results for input(s): GLUCAP in the last 168 hours. Iron Studies: No results for input(s): IRON, TIBC, TRANSFERRIN, FERRITIN in the last 72 hours. Studies/Results: Dg Chest 2 View  Result Date: 11/08/2016 CLINICAL DATA:  Fever and cough EXAM: CHEST  2  VIEW COMPARISON:  Chest radiograph 07/04/2016 FINDINGS: Cardiomegaly. Status post median sternotomy for coronary artery bypass graft. There is central pulmonary vascular congestion without overt pulmonary edema. No pleural effusion or pneumothorax. No focal consolidation. IMPRESSION: Pulmonary vascular congestion and cardiomegaly without overt edema. Bilateral parahilar nodular opacities are favored to be vessels seen on end. Nonemergent chest CT could be considered for better characterization. Electronically Signed   By: Ulyses Jarred M.D.   On: 11/08/2016 22:53   Dg Foot Complete Left  Result Date: 11/09/2016 CLINICAL DATA:  Chronic foot wounds. EXAM: LEFT FOOT - COMPLETE 3+ VIEW COMPARISON:  Radiographs of July 14, 2016. FINDINGS: Status post surgical amputation of the first toe as well as the fourth distal metatarsal and phalanges. Vascular calcifications are noted. Stable neuropathic changes are seen involving the midfoot. No acute fracture is noted. No lytic destruction is seen to suggest  osteomyelitis. IMPRESSION: Stable postsurgical and degenerative changes as described above. No definite acute abnormality is noted. Electronically Signed   By: Marijo Conception, M.D.   On: 11/09/2016 09:15    Dialysis:  [facility] MWF 4h  86kg  2K/2Ca   L AVF Hep 4000 -Hectorol 2 mcg IV qHD -Mircera 225 mcg IV q 2 weeks (last dosed 10/17)  Assessment/Plan: 1. Fever - Tmax 102 on admission - possible sources of infection, L foot ulcer, dental carries, URI. Empiric Vanc/Zosyn started, cultures, resp panel pending, per primary 2. Chronic L foot ulcer - followed as outpatient by podiatry. Last cx 10/20 neg, discussing wound VAC 3.  ESRD -  MWF. Continue on schedule. Next HD 10/31 4.  Hypertension/volume  - BP stable, cont home meds/CXR showing vascular congestion. Not to EDW yet UF goal 3-4L with next HD 5.  Anemia  - Hgb 9.3. ESA due 10/31, Aranesp 200 with next HD  6.  Metabolic bone disease -  Cont VDRA/Renvela binder  7.  Nutrition - Renal diet/vitamins/prostat for low albumin 8. DM - per primary    Lynnda Child PA-C [place] Pager [phone] 11/09/2016, 12:36 PM   Pt seen, examined and agree w A/P as above.    Kelly Splinter MD [employer] pager [phone]   11/09/2016, 3:47 PM

## 2016-11-09 NOTE — Progress Notes (Signed)
Pharmacy Antibiotic Note  Tron Flythe is a 56 y.o. male admitted on 11/09/2016 with sepsis.  Pharmacy has been consulted for vancomycin and zosyn dosing. Tmax is 102.1 and WBC is WNL. Pt with history of ESRD on HD MWF. Lactic acid is <2.   Plan: Vanc 1500mg  IV x 1 then 1gm post-HD MWF Zosyn 3.375gm IV Q12H (4 hr inf) F/u renal plans, C&S, clinical status and pre-HD vanc level when appropriate  Height: 5\' 11"  (180.3 cm) Weight: 190 lb 7.6 oz (86.4 kg) IBW/kg (Calculated) : 75.3  Temp (24hrs), Avg:100.9 F (38.3 C), Min:99.6 F (37.6 C), Max:102.1 F (38.9 C)   Recent Labs Lab 11/08/16 2216 11/08/16 2235  WBC 6.4  --   CREATININE 5.05*  --   LATICACIDVEN  --  1.78    Estimated Creatinine Clearance: 17.2 mL/min (A) (by C-G formula based on SCr of 5.05 mg/dL (H)).    Allergies  Allergen Reactions  . Morphine And Related Other (See Comments)    hallucinations  . Tygacil [Tigecycline] Nausea And Vomiting and Other (See Comments)    Makes him feel crazy    Antimicrobials this admission: Vanc 10/30>> Zosyn 10/30>>  Dose adjustments this admission: N/A  Microbiology results: Pending  Thank you for allowing pharmacy to be a part of this patient's care.  Shivani Barrantes, Rande Lawman 11/09/2016 7:46 AM

## 2016-11-09 NOTE — ED Notes (Signed)
Hospitalist PA at bedside at this time.

## 2016-11-09 NOTE — H&P (Signed)
History and Physical    Matthew Kline WUJ:[id number] DOB: 10/28/1960 DOA: 11/09/2016   PCP: Donald Prose, MD   Patient coming from:  Home    Chief Complaint: Fever and chills   HPI: Matthew Kline is a 56 y.o. male with very extensive medical history listed below including ESRD, CAD status post CABG, CHF with preserved EF, diabetes type 2 with peripheral neuropathy, PAD, anemia, history of CVA, current smoker, presenting with fever up to 102 , chills after his dialysis session yesterday (he dialyzes Monday Wednesday and Friday). He also reports a running nose, but without sick mucous production. He does report taking his flu shots one month ago. He denies any sick contacts. No recent long distance trips He denies any worsening shortness of breath, or chest pain. Of note, the patient reports right lower dental pain, that had started a few days ago, but without face swelling, or purulence in the area. He does have a cavity for a long period of time. He denies any difficulty swallowing, or headaches.He denies any nausea, or vomiting. He denies any abdominal pain. He still makes urine, and denies any suprapubic tenderness, or dysuria or hematuria. In addition, he is complaining of left foot wound, chronic, since May 2018, which is being cared for by wound care, but this area does not have any odor or drainage that is different from prior. He denies any calf pain. He is worried that he is fever may have come from these 2 sources mentioned above.   ED Course:  BP 137/66   Pulse (!) 107   Temp 99.6 F (37.6 C) (Oral)   Resp 18   Ht 5\' 11"  (1.803 m)   Wt 86.4 kg (190 lb 7.6 oz)   SpO2 92%   BMI 26.57 kg/m    Creatinine 5.05 Hemoglobin 9.3 Lesions 0.5, ALT 9 Blood culture pending Urinalysis pending Influenza by PCR negative  Lactic acid 1.78 Chest x-ray shows pulmonary vascular, congestion and cardiomegaly without overt edema Left foot x-ray is negative for acute osseous  abnormalities Received vancomycin and Zosyn Received Tylenol for pain control and fever No IV fluids were given in view of ESRD on hemodialysis/fluid overload   Review of Systems:  As per HPI otherwise all other systems reviewed and are negative  Past Medical History:  Diagnosis Date  . Allergy   . Anemia, unspecified   . Anxiety   . Arthralgia 2010   polyarticular  . Arthritis    "back, knees" (05/09/2014)  . CHF (congestive heart failure) ([place])   . CHF (congestive heart failure) ([place]) 07/25/2009   denies  . Chronic lower back pain   . Coronary artery disease   . Coughing    pt. reports that he has drainage from sinus infection  . Diabetic neuropathy ([place])   . ESRD (end stage renal disease) on dialysis [facility])    started 12/2012; "MWF; [place] "   . GERD (gastroesophageal reflux disease)    hx "before I lost weight", no problem 9 years  . Hemodialysis access site with mature fistula ([place])   . Hemorrhoids, internal 10/2011   small  . History of blood transfusion    "related to the anemia"  . Hypertension   . Insomnia, unspecified   . Knee pain, left   . 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 Long term (current) use of anticoagulants   . Myocardial infarction ([place]) 1995  . Orthostatic hypotension   . Osteomyelitis of foot, left,  acute ([place])   . Other chronic postoperative pain   . Pneumonia    "probably 4-5 times" (05/09/2014)  . Polymyalgia rheumatica ([place])   . Renal insufficiency   . Sleep apnea    "lost weight; no more problem" (05/09/2014)  . Stroke ([place]) 01/10/06   denies residual on 05/09/2014  . Type II diabetes mellitus ([place]) dx'd 1995  . Ulcer    diabetic foot   . Unspecified hereditary and idiopathic peripheral neuropathy    feet  . Unspecified osteomyelitis, site unspecified   . Unspecified vitamin D deficiency     Past Surgical History:  Procedure Laterality Date  . ABDOMINAL AORTOGRAM N/A 08/25/2016   Procedure: ABDOMINAL AORTOGRAM;   Surgeon: Wellington Hampshire, MD;  Location: [place] [facility];  Service: Cardiovascular;  Laterality: N/A;  . AMPUTATION  01/21/2012   Procedure: AMPUTATION RAY;  Surgeon: [name], MD;  Location: [place];  Service: Orthopedics;  Laterality: Left;  Left Foot 4th Ray Amputation  . AMPUTATION Left 05/04/2013   Procedure: AMPUTATION DIGIT;  Surgeon: [name], MD;  Location: [place];  Service: Orthopedics;  Laterality: Left;  Left Great Toe Amputation at [facility]  . ANTERIOR CERVICAL DECOMP/DISCECTOMY FUSION  02/2011  . BASCILIC VEIN TRANSPOSITION Left 10/19/2012   Procedure: BASCILIC VEIN TRANSPOSITION;  Surgeon: Serafina Mitchell, MD;  Location: [place];  Service: Vascular;  Laterality: Left;  . CORONARY ARTERY BYPASS GRAFT     x 5 with lima at [place] WITH ANTIBIOTIC SPACERS Left 08/07/2014   Procedure: Replace Left Total Knee Arthroplasty,  Place Antibiotic Spacer;  Surgeon: [name], MD;  Location: [place];  Service: Orthopedics;  Laterality: Left;  . I&D EXTREMITY Left 05/09/2014   Procedure: Irrigation and Debridement Left Knee and Closure of Total Knee Arthroplasty Incision;  Surgeon: [name], MD;  Location: [place];  Service: Orthopedics;  Laterality: Left;  . I&D KNEE WITH POLY EXCHANGE Left 05/31/2014   Procedure: IRRIGATION AND DEBRIDEMENT LEFT KNEE, PLACE ANTIBIOTIC BEADS,  POLY EXCHANGE;  Surgeon: [name], MD;  Location: [place];  Service: Orthopedics;  Laterality: Left;  . KNEE ARTHROSCOPY Left 08-25-2012  . LOWER EXTREMITY ANGIOGRAPHY Left 08/25/2016   Procedure: Lower Extremity Angiography;  Surgeon: Wellington Hampshire, MD;  Location: [place] [facility];  Service: Cardiovascular;  Laterality: Left;  . PERIPHERAL VASCULAR BALLOON ANGIOPLASTY Left 08/25/2016   Procedure: PERIPHERAL VASCULAR BALLOON ANGIOPLASTY;  Surgeon: Wellington Hampshire, MD;  Location: [place] [facility];  Service: Cardiovascular;  Laterality: Left;  lt peroneal and ant tibial  arteries cutting balloon  . REFRACTIVE SURGERY Bilateral   . TOE AMPUTATION Bilateral    "I've lost 7 toes over the last 7 years" (05/09/2014)  . TOE SURGERY Left April 2015   Big toe removed on left foot.  . TONSILLECTOMY    . TOTAL KNEE ARTHROPLASTY Left 04/10/2014   Procedure: TOTAL KNEE ARTHROPLASTY;  Surgeon: [name], MD;  Location: [place];  Service: Orthopedics;  Laterality: Left;  . TOTAL KNEE REVISION Left 10/25/2014   Procedure: LEFT TOTAL KNEE REVISION;  Surgeon: [name], MD;  Location: [place];  Service: Orthopedics;  Laterality: Left;  . TOTAL KNEE REVISION Left 11/26/2015   Procedure: Removal Left Total Knee Arthroplasty, Hinged Total Knee Arthroplasty;  Surgeon: [name], MD;  Location: [place];  Service: Orthopedics;  Laterality: Left;  . UVULOPALATOPHARYNGOPLASTY, TONSILLECTOMY AND SEPTOPLASTY  ~ 1989  .  WOUND DEBRIDEMENT Left 05/09/2014   Dehiscence Left Total Knee Arthroplasty Incision    Social History Social History   Social History  . Marital status: Married    Spouse name: N/A  . Number of children: N/A  . Years of education: N/A   Occupational History  . Not on file.   Social History Main Topics  . Smoking status: Former Smoker    Packs/day: 0.12    Years: 32.00    Types: Cigarettes    Quit date: 05/28/2016  . Smokeless tobacco: Former Systems developer    Quit date: 10/06/2014     Comment: 3 -4 cig daily   . Alcohol use No  . Drug use: No  . Sexual activity: Yes   Other Topics Concern  . Not on file   Social History Narrative  . No narrative on file     Allergies  Allergen Reactions  . Morphine And Related Other (See Comments)    hallucinations  . Tygacil [Tigecycline] Nausea And Vomiting and Other (See Comments)    Makes him feel crazy    Family History  Problem Relation Age of Onset  . Hypertension Mother   . Cancer Mother 87       Ovarian  . Heart disease Maternal Aunt   . Stroke Maternal Grandfather       Prior to Admission  medications   Medication Sig Start Date End Date Taking? Authorizing Provider  amLODipine (NORVASC) 5 MG tablet Take 5 mg by mouth at bedtime.   Yes [provider]  aspirin 81 MG EC tablet TAKE 1 TABLET EVERY DAY Patient taking differently: Take 162 mg by mouth daily 08/05/15  Yes Eulas Post, Monica, DO  atorvastatin (LIPITOR) 40 MG tablet Take 40 mg by mouth daily at 6 PM.   Yes [provider]  carvedilol (COREG) 25 MG tablet Take 25 mg by mouth 2 (two) times daily with a meal.   Yes [provider]  clopidogrel (PLAVIX) 75 MG tablet Take 1 tablet (75 mg total) by mouth daily. 08/25/16 08/25/17 Yes Wellington Hampshire, MD  gabapentin (NEURONTIN) 300 MG capsule TAKE ONE CAPSULE BY MOUTH 3 TIMES A DAY Patient taking differently: TAKE ONE CAPSULE BY MOUTH DAILY IN THE EVENING 02/20/16  Yes Dondra Prader R, NP  montelukast (SINGULAIR) 10 MG tablet TAKE 1 TABLET (10 MG TOTAL) BY MOUTH AT BEDTIME. 09/01/15  Yes Gildardo Cranker, DO  sevelamer carbonate (RENVELA) 800 MG tablet Take 2,400-3,200 mg by mouth 3 (three) times daily with meals. Take 2400 to 3200 mg with each me (based on the size of the meal) 3 times daily and take 800 mg with large snacks   Yes [provider]  temazepam (RESTORIL) 30 MG capsule Take 30 mg by mouth at bedtime.  11/10/15  Yes [provider]    Physical Exam:  Vitals:   11/09/16 0700 11/09/16 0800 11/09/16 0830 11/09/16 0845  BP: (!) 146/94 (!) 177/101 (!) 156/88 137/66  Pulse: (!) 114 (!) 116 (!) 109 (!) 107  Resp: (!) 28 (!) 29 20 18   Temp:      TempSrc:      SpO2: 100% 96% 93% 92%  Weight:      Height:       Constitutional: NAD, calm, chronically ill appearing. Continues to have chills  Eyes: PERRL, lids and conjunctivae normal ENMT: Mucous membranes are moist, without exudate or lesions. Dental caries are visible, no swelling or drainage from the R lower cavities  Neck:  normal, supple, no masses, no thyromegaly Respiratory:  clear to auscultation bilaterally, no wheezing, no crackles. Normal respiratory effort  Cardiovascular: Regular rate and rhythm,  murmur, rubs or gallops. 2+ LEE extremity edema. R fistula without drainage, or redness  2+ pedal pulses. No carotid bruits.  Abdomen: Soft, non tender, No hepatosplenomegaly. Bowel sounds positive.  Musculoskeletal: no clubbing / cyanosis. Moves all extremities Skin: no jaundice,  Large chronic wound on the plantar aspect of the L foot, no odor, minimal drainage, no surrounding erythema. Multiple toe amputations  Neurologic: Sensation intact  Strength equal in all extremities Psychiatric:   Alert and oriented x 3. Flat affect       Labs on Admission: I have personally reviewed following labs and imaging studies  CBC:  Recent Labs Lab 11/08/16 2216  WBC 6.4  NEUTROABS 5.6  HGB 9.3*  HCT 30.8*  MCV 91.1  PLT 272    Basic Metabolic Panel:  Recent Labs Lab 11/08/16 2216  NA 137  K 3.9  CL 97*  CO2 28  GLUCOSE 104*  BUN 21*  CREATININE 5.05*  CALCIUM 8.6*    GFR: Estimated Creatinine Clearance: 17.2 mL/min (A) (by C-G formula based on SCr of 5.05 mg/dL (H)).  Liver Function Tests:  Recent Labs Lab 11/08/16 2216  AST 17  ALT 9*  ALKPHOS 111  BILITOT 0.8  PROT 7.6  ALBUMIN 2.8*   No results for input(s): LIPASE, AMYLASE in the last 168 hours. No results for input(s): AMMONIA in the last 168 hours.  Coagulation Profile:  Recent Labs Lab 11/08/16 2216  INR 1.30    Cardiac Enzymes: No results for input(s): CKTOTAL, CKMB, CKMBINDEX, TROPONINI in the last 168 hours.  BNP (last 3 results) No results for input(s): PROBNP in the last 8760 hours.  HbA1C: No results for input(s): HGBA1C in the last 72 hours.  CBG: No results for input(s): GLUCAP in the last 168 hours.  Lipid Profile: No results for input(s): CHOL, HDL, LDLCALC, TRIG, CHOLHDL, LDLDIRECT in the last 72 hours.  Thyroid Function Tests: No results for input(s):  TSH, T4TOTAL, FREET4, T3FREE, THYROIDAB in the last 72 hours.  Anemia Panel: No results for input(s): VITAMINB12, FOLATE, FERRITIN, TIBC, IRON, RETICCTPCT in the last 72 hours.  Urine analysis:    Component Value Date/Time   COLORURINE YELLOW 01/03/2012 1335   APPEARANCEUR Clear 07/12/2013 0918   LABSPEC 1.021 01/03/2012 1335   PHURINE 6.0 01/03/2012 1335   GLUCOSEU Trace (A) 07/12/2013 0918   HGBUR TRACE (A) 01/03/2012 1335   BILIRUBINUR Negative 07/12/2013 0918   KETONESUR NEGATIVE 01/03/2012 1335   PROTEINUR 3+ (A) 07/12/2013 0918   PROTEINUR >300 (A) 01/03/2012 1335   UROBILINOGEN 0.2 01/03/2012 1335   NITRITE Negative 07/12/2013 0918   NITRITE NEGATIVE 01/03/2012 1335   LEUKOCYTESUR Negative 07/12/2013 0918    Sepsis Labs: @LABRCNTIP (procalcitonin:4,lacticidven:4) ) Recent Results (from the past 240 hour(s))  Culture, blood (Routine x 2)     Status: None (Preliminary result)   Collection Time: 11/08/16 10:00 PM  Result Value Ref Range Status   Specimen Description BLOOD LEFT HAND  Final   Special Requests   Final    BOTTLES DRAWN AEROBIC ONLY Blood Culture results may not be optimal due to an excessive volume of blood received in culture bottles   Culture PENDING  Incomplete   Report Status PENDING  Incomplete     Radiological Exams on Admission: Dg Chest 2 View  Result Date: 11/08/2016 CLINICAL DATA:  Fever and cough  EXAM: CHEST  2 VIEW COMPARISON:  Chest radiograph 07/04/2016 FINDINGS: Cardiomegaly. Status post median sternotomy for coronary artery bypass graft. There is central pulmonary vascular congestion without overt pulmonary edema. No pleural effusion or pneumothorax. No focal consolidation. IMPRESSION: Pulmonary vascular congestion and cardiomegaly without overt edema. Bilateral parahilar nodular opacities are favored to be vessels seen on end. Nonemergent chest CT could be considered for better characterization. Electronically Signed   By: Ulyses Jarred M.D.    On: 11/08/2016 22:53   Dg Foot Complete Left  Result Date: 11/09/2016 CLINICAL DATA:  Chronic foot wounds. EXAM: LEFT FOOT - COMPLETE 3+ VIEW COMPARISON:  Radiographs of July 14, 2016. FINDINGS: Status post surgical amputation of the first toe as well as the fourth distal metatarsal and phalanges. Vascular calcifications are noted. Stable neuropathic changes are seen involving the midfoot. No acute fracture is noted. No lytic destruction is seen to suggest osteomyelitis. IMPRESSION: Stable postsurgical and degenerative changes as described above. No definite acute abnormality is noted. Electronically Signed   By: Marijo Conception, M.D.   On: 11/09/2016 09:15    EKG: Independently reviewed.  Assessment/Plan Active Problems:   Sepsis ([place])   Essential hypertension   Vitamin D deficiency   Diabetes mellitus due to underlying condition ([place])   Hyperlipidemia LDL goal <100   Anemia of chronic disease   Peripheral autonomic neuropathy due to DM (H[facility])   Polymyalgia rheumatica ([facility])   Bile reflux gastritis   GERD (gastroesophageal reflux disease)   Depression   Tobacco abuse   Fever, unknown origin   Febrile illness of unknown origin, with chills possible related to vector during HD, viral versus bacterial. T max 102, now 99.6  Doubt sepsis. VSS, WBC normal, LA normal, No confusion noted  Lymphs  0.5, ALT 9.  Influenza neg . UA and B CX  is pending Chest x-ray shows pulmonary vascular, congestion and cardiomegaly without overt edema. No IV fluids were given in view of ESRD on hemodialysis/fluid overload. Received vancomycin and Zosyn Received Tylenol for pain control and fever. History of L fot chronic ulcer, with neg findings, XRays. Left jaw cavity without purulence or swelling (see below)   Continue Vanc and Zosyn for now  Lactic Acid  Resp. Virus panel  Supportive therapy with antipyretics   Renal to see in view of need for IVF but fluid overload due to ESRD   ESRD on HD MWF last on  Monday 10/29  Cr 5.05,  At baseline. [place] Nephrologist Dr. Lorrene Reid  Nephrology involved  Renal Diet. Continue supplements  Other plans as per Nephrology Check CMET in am   Hypertension BP 137/66   Pulse  107  Controlled Continue home anti-hypertensive medications tonight   Hyperlipidemia Continue home statins   Type II Diabetes with Neuropathy  Current blood sugar level is 104  Lab Results  Component Value Date   HGBA1C 6.6 (H) 11/24/2015  Hgb A1C  SSI Continue Neurontin   Chronic diastolic heart failure, last echocardiogram  9/201 55% -   47% normal systolic, Grade 1 DD weight 190 lbs CXR as above with some fluid overload     Daily weights, strict I/O CXR in am MAy need HD as per Renal  Continue COreg   Anemia of chronic disease Hemoglobin on admission 9.3 at baseline  Repeat CBC in am  No transfusion is indicated at this time   Anxiety Continue home Restoril   History of amputations, chronic ulcers deconditioning. Left foot x-ray is negative for  acute osseous abnormalties  PT/OT Wound care   Right lower cavities, no swelling or purulence  Pain control Oral care per nursing Will need Odontology evaluation as OP   DVT prophylaxis:  Heparin  Code Status:    Full  Family Communication:  Discussed with patient Disposition Plan: Expect patient to be discharged to home after condition improves Consults called:    Nephrology by EDP  Admission status: [place], PA-C [facility]   11/09/2016, 10:09 AM

## 2016-11-09 NOTE — ED Notes (Signed)
Dr. Marily Memos at bedside speaking with patient at this time.

## 2016-11-10 LAB — RESPIRATORY PANEL BY PCR
Adenovirus: NOT DETECTED
BORDETELLA PERTUSSIS-RVPCR: NOT DETECTED
CORONAVIRUS HKU1-RVPPCR: NOT DETECTED
Chlamydophila pneumoniae: NOT DETECTED
Coronavirus 229E: NOT DETECTED
Coronavirus NL63: NOT DETECTED
Coronavirus OC43: NOT DETECTED
INFLUENZA A-RVPPCR: NOT DETECTED
INFLUENZA B-RVPPCR: NOT DETECTED
Metapneumovirus: NOT DETECTED
Mycoplasma pneumoniae: NOT DETECTED
PARAINFLUENZA VIRUS 2-RVPPCR: NOT DETECTED
PARAINFLUENZA VIRUS 3-RVPPCR: NOT DETECTED
Parainfluenza Virus 1: NOT DETECTED
Parainfluenza Virus 4: NOT DETECTED
RHINOVIRUS / ENTEROVIRUS - RVPPCR: NOT DETECTED
Respiratory Syncytial Virus: NOT DETECTED

## 2016-11-10 LAB — BLOOD CULTURE ID PANEL (REFLEXED)
Acinetobacter baumannii: NOT DETECTED
CANDIDA KRUSEI: NOT DETECTED
CANDIDA PARAPSILOSIS: NOT DETECTED
Candida albicans: NOT DETECTED
Candida glabrata: NOT DETECTED
Candida tropicalis: NOT DETECTED
ENTEROCOCCUS SPECIES: NOT DETECTED
Enterobacter cloacae complex: NOT DETECTED
Enterobacteriaceae species: NOT DETECTED
Escherichia coli: NOT DETECTED
HAEMOPHILUS INFLUENZAE: NOT DETECTED
Klebsiella oxytoca: NOT DETECTED
Klebsiella pneumoniae: NOT DETECTED
LISTERIA MONOCYTOGENES: NOT DETECTED
Neisseria meningitidis: NOT DETECTED
PROTEUS SPECIES: NOT DETECTED
PSEUDOMONAS AERUGINOSA: NOT DETECTED
SERRATIA MARCESCENS: NOT DETECTED
STAPHYLOCOCCUS AUREUS BCID: NOT DETECTED
STAPHYLOCOCCUS SPECIES: NOT DETECTED
STREPTOCOCCUS PNEUMONIAE: NOT DETECTED
STREPTOCOCCUS PYOGENES: NOT DETECTED
STREPTOCOCCUS SPECIES: NOT DETECTED
Streptococcus agalactiae: NOT DETECTED

## 2016-11-10 LAB — HIV ANTIBODY (ROUTINE TESTING W REFLEX): HIV SCREEN 4TH GENERATION: NONREACTIVE

## 2016-11-11 ENCOUNTER — Encounter (HOSPITAL_BASED_OUTPATIENT_CLINIC_OR_DEPARTMENT_OTHER): Payer: Medicare HMO | Attending: Internal Medicine

## 2016-11-11 DIAGNOSIS — E1169 Type 2 diabetes mellitus with other specified complication: Secondary | ICD-10-CM | POA: Insufficient documentation

## 2016-11-11 DIAGNOSIS — E1122 Type 2 diabetes mellitus with diabetic chronic kidney disease: Secondary | ICD-10-CM | POA: Insufficient documentation

## 2016-11-11 DIAGNOSIS — E1140 Type 2 diabetes mellitus with diabetic neuropathy, unspecified: Secondary | ICD-10-CM | POA: Insufficient documentation

## 2016-11-11 DIAGNOSIS — I2510 Atherosclerotic heart disease of native coronary artery without angina pectoris: Secondary | ICD-10-CM | POA: Insufficient documentation

## 2016-11-11 DIAGNOSIS — Z96659 Presence of unspecified artificial knee joint: Secondary | ICD-10-CM | POA: Insufficient documentation

## 2016-11-11 DIAGNOSIS — D631 Anemia in chronic kidney disease: Secondary | ICD-10-CM | POA: Insufficient documentation

## 2016-11-11 DIAGNOSIS — E11621 Type 2 diabetes mellitus with foot ulcer: Secondary | ICD-10-CM | POA: Insufficient documentation

## 2016-11-11 DIAGNOSIS — N186 End stage renal disease: Secondary | ICD-10-CM | POA: Insufficient documentation

## 2016-11-11 DIAGNOSIS — I120 Hypertensive chronic kidney disease with stage 5 chronic kidney disease or end stage renal disease: Secondary | ICD-10-CM | POA: Insufficient documentation

## 2016-11-11 DIAGNOSIS — F17200 Nicotine dependence, unspecified, uncomplicated: Secondary | ICD-10-CM | POA: Insufficient documentation

## 2016-11-11 DIAGNOSIS — Z992 Dependence on renal dialysis: Secondary | ICD-10-CM | POA: Insufficient documentation

## 2016-11-11 DIAGNOSIS — M86372 Chronic multifocal osteomyelitis, left ankle and foot: Secondary | ICD-10-CM | POA: Insufficient documentation

## 2016-11-11 DIAGNOSIS — E1151 Type 2 diabetes mellitus with diabetic peripheral angiopathy without gangrene: Secondary | ICD-10-CM | POA: Insufficient documentation

## 2016-11-11 DIAGNOSIS — F17210 Nicotine dependence, cigarettes, uncomplicated: Secondary | ICD-10-CM | POA: Insufficient documentation

## 2016-11-11 DIAGNOSIS — L97424 Non-pressure chronic ulcer of left heel and midfoot with necrosis of bone: Secondary | ICD-10-CM | POA: Insufficient documentation

## 2016-11-12 DIAGNOSIS — E1169 Type 2 diabetes mellitus with other specified complication: Secondary | ICD-10-CM | POA: Diagnosis not present

## 2016-11-12 DIAGNOSIS — Z96659 Presence of unspecified artificial knee joint: Secondary | ICD-10-CM | POA: Diagnosis not present

## 2016-11-12 DIAGNOSIS — E1151 Type 2 diabetes mellitus with diabetic peripheral angiopathy without gangrene: Secondary | ICD-10-CM | POA: Diagnosis not present

## 2016-11-12 DIAGNOSIS — L97424 Non-pressure chronic ulcer of left heel and midfoot with necrosis of bone: Secondary | ICD-10-CM | POA: Diagnosis not present

## 2016-11-12 DIAGNOSIS — N186 End stage renal disease: Secondary | ICD-10-CM | POA: Diagnosis not present

## 2016-11-12 DIAGNOSIS — D631 Anemia in chronic kidney disease: Secondary | ICD-10-CM | POA: Diagnosis not present

## 2016-11-12 DIAGNOSIS — F17200 Nicotine dependence, unspecified, uncomplicated: Secondary | ICD-10-CM | POA: Diagnosis not present

## 2016-11-12 DIAGNOSIS — I120 Hypertensive chronic kidney disease with stage 5 chronic kidney disease or end stage renal disease: Secondary | ICD-10-CM | POA: Diagnosis not present

## 2016-11-12 DIAGNOSIS — E1122 Type 2 diabetes mellitus with diabetic chronic kidney disease: Secondary | ICD-10-CM | POA: Diagnosis not present

## 2016-11-12 DIAGNOSIS — I2510 Atherosclerotic heart disease of native coronary artery without angina pectoris: Secondary | ICD-10-CM | POA: Diagnosis not present

## 2016-11-12 DIAGNOSIS — E1140 Type 2 diabetes mellitus with diabetic neuropathy, unspecified: Secondary | ICD-10-CM | POA: Diagnosis not present

## 2016-11-12 DIAGNOSIS — E11621 Type 2 diabetes mellitus with foot ulcer: Secondary | ICD-10-CM | POA: Diagnosis not present

## 2016-11-12 DIAGNOSIS — Z992 Dependence on renal dialysis: Secondary | ICD-10-CM | POA: Diagnosis not present

## 2016-11-12 DIAGNOSIS — F17210 Nicotine dependence, cigarettes, uncomplicated: Secondary | ICD-10-CM | POA: Diagnosis not present

## 2016-11-12 DIAGNOSIS — M86372 Chronic multifocal osteomyelitis, left ankle and foot: Secondary | ICD-10-CM | POA: Diagnosis not present

## 2016-11-12 LAB — CULTURE, BLOOD (ROUTINE X 2)

## 2016-11-13 ENCOUNTER — Telehealth: Payer: Self-pay | Admitting: Emergency Medicine

## 2016-11-13 ENCOUNTER — Telehealth (HOSPITAL_BASED_OUTPATIENT_CLINIC_OR_DEPARTMENT_OTHER): Payer: Self-pay | Admitting: Emergency Medicine

## 2016-11-13 NOTE — Progress Notes (Signed)
ED Antimicrobial Stewardship Positive Culture Follow Up   Matthew Kline is an 56 y.o. male who presented to [facility] on 11/09/2016 with a chief complaint of  Chief Complaint  Patient presents with  . Dental Pain  . Wound Check    chronic leg wound  . Fever    Recent Results (from the past 720 hour(s))  Aerobic/Anaerobic Culture (surgical/deep wound)     Status: None   Collection Time: 10/25/16  3:30 PM  Result Value Ref Range Status   Specimen Description BONE  Final   Special Requests NONE  Final   Gram Stain   Final    RARE WBC PRESENT,BOTH PMN AND MONONUCLEAR NO ORGANISMS SEEN    Culture   Final    No growth aerobically or anaerobically. Performed at [facility], [place] [address] [place], [place] [address]    Report Status 10/30/2016 FINAL  Final  Culture, blood (Routine x 2)     Status: Abnormal   Collection Time: 11/08/16 10:00 PM  Result Value Ref Range Status   Specimen Description BLOOD LEFT HAND  Final   Special Requests   Final    BOTTLES DRAWN AEROBIC ONLY Blood Culture results may not be optimal due to an excessive volume of blood received in culture bottles   Culture  Setup Time   Final    GRAM NEGATIVE RODS AEROBIC BOTTLE ONLY CRITICAL VALUE NOTED.  VALUE IS CONSISTENT WITH PREVIOUSLY REPORTED AND CALLED VALUE.    Culture (A)  Final    MYROIDES SPECIES SUSCEPTIBILITIES PERFORMED ON PREVIOUS CULTURE WITHIN THE LAST 5 DAYS.    Report Status 11/12/2016 FINAL  Final  Culture, blood (Routine x 2)     Status: Abnormal   Collection Time: 11/08/16 10:16 PM  Result Value Ref Range Status   Specimen Description BLOOD LEFT ARM  Final   Special Requests   Final    BOTTLES DRAWN AEROBIC AND ANAEROBIC Blood Culture results may not be optimal due to an excessive volume of blood received in culture bottles   Culture  Setup Time   Final    GRAM NEGATIVE RODS AEROBIC BOTTLE ONLY CRITICAL RESULT CALLED TO, READ BACK BY AND VERIFIED WITH: Rosemarie Ax, RN (MHP)  AT [phone] ON 11/10/16 BY C. JESSUP, MLT.    Culture MYROIDES SPECIES (A)  Final   Report Status 11/12/2016 FINAL  Final   Organism ID, Bacteria MYROIDES SPECIES  Final      Susceptibility   Myroides species - MIC*    CEFAZOLIN >=64 RESISTANT Resistant     CEFEPIME 8 SENSITIVE Sensitive     CEFTAZIDIME >=64 RESISTANT Resistant     CEFTRIAXONE >=64 RESISTANT Resistant     CIPROFLOXACIN 1 SENSITIVE Sensitive     GENTAMICIN >=16 RESISTANT Resistant     IMIPENEM 2 SENSITIVE Sensitive     TRIMETH/SULFA >=320 RESISTANT Resistant     PIP/TAZO 16 SENSITIVE Sensitive     * MYROIDES SPECIES  Blood Culture ID Panel (Reflexed)     Status: None   Collection Time: 11/08/16 10:16 PM  Result Value Ref Range Status   Enterococcus species NOT DETECTED NOT DETECTED Final   Listeria monocytogenes NOT DETECTED NOT DETECTED Final   Staphylococcus species NOT DETECTED NOT DETECTED Final   Staphylococcus aureus NOT DETECTED NOT DETECTED Final   Streptococcus species NOT DETECTED NOT DETECTED Final   Streptococcus agalactiae NOT DETECTED NOT DETECTED Final   Streptococcus pneumoniae NOT DETECTED NOT DETECTED Final   Streptococcus  pyogenes NOT DETECTED NOT DETECTED Final   Acinetobacter baumannii NOT DETECTED NOT DETECTED Final   Enterobacteriaceae species NOT DETECTED NOT DETECTED Final   Enterobacter cloacae complex NOT DETECTED NOT DETECTED Final   Escherichia coli NOT DETECTED NOT DETECTED Final   Klebsiella oxytoca NOT DETECTED NOT DETECTED Final   Klebsiella pneumoniae NOT DETECTED NOT DETECTED Final   Proteus species NOT DETECTED NOT DETECTED Final   Serratia marcescens NOT DETECTED NOT DETECTED Final   Haemophilus influenzae NOT DETECTED NOT DETECTED Final   Neisseria meningitidis NOT DETECTED NOT DETECTED Final   Pseudomonas aeruginosa NOT DETECTED NOT DETECTED Final   Candida albicans NOT DETECTED NOT DETECTED Final   Candida glabrata NOT DETECTED NOT DETECTED Final   Candida krusei NOT  DETECTED NOT DETECTED Final   Candida parapsilosis NOT DETECTED NOT DETECTED Final   Candida tropicalis NOT DETECTED NOT DETECTED Final  Respiratory Panel by PCR     Status: None   Collection Time: 11/09/16  8:00 AM  Result Value Ref Range Status   Adenovirus NOT DETECTED NOT DETECTED Final   Coronavirus 229E NOT DETECTED NOT DETECTED Final   Coronavirus HKU1 NOT DETECTED NOT DETECTED Final   Coronavirus NL63 NOT DETECTED NOT DETECTED Final   Coronavirus OC43 NOT DETECTED NOT DETECTED Final   Metapneumovirus NOT DETECTED NOT DETECTED Final   Rhinovirus / Enterovirus NOT DETECTED NOT DETECTED Final   Influenza A NOT DETECTED NOT DETECTED Final   Influenza B NOT DETECTED NOT DETECTED Final   Parainfluenza Virus 1 NOT DETECTED NOT DETECTED Final   Parainfluenza Virus 2 NOT DETECTED NOT DETECTED Final   Parainfluenza Virus 3 NOT DETECTED NOT DETECTED Final   Parainfluenza Virus 4 NOT DETECTED NOT DETECTED Final   Respiratory Syncytial Virus NOT DETECTED NOT DETECTED Final   Bordetella pertussis NOT DETECTED NOT DETECTED Final   Chlamydophila pneumoniae NOT DETECTED NOT DETECTED Final   Mycoplasma pneumoniae NOT DETECTED NOT DETECTED Final   Return to ED d/t positive blood cx  ED Provider: Ardyth Harps, PA-C  Wynell Balloon 11/13/2016, 9:57 AM Infectious Diseases Pharmacist Phone# [phone]

## 2016-11-13 NOTE — Telephone Encounter (Signed)
Post ED Visit - Positive Culture Follow-up: Successful Patient Follow-Up  Culture assessed and recommendations reviewed by: []  Elenor Quinones, Pharm.D. []  Heide Guile, Pharm.D., BCPS AQ-ID [x]  Parks Neptune, Pharm.D., BCPS []  Alycia Rossetti, Pharm.D., BCPS []  [place], Pharm.D., BCPS, AAHIVP []  Legrand Como, Pharm.D., BCPS, AAHIVP []  Salome Arnt, PharmD, BCPS []  Dimitri Ped, PharmD, BCPS []  Vincenza Hews, PharmD, BCPS  Positive blood culture  []  Patient discharged without antimicrobial prescription and treatment is now indicated []  Organism is resistant to prescribed ED discharge antimicrobial [x]  Patient with positive blood cultures  Changes discussed with ED provider: Ardyth Harps PA Explained the need to return to hospital for possible IV antibiotics  Contacted patient, 11/13/2016   Matthew Kline 11/13/2016, 11:21 AM

## 2016-11-16 ENCOUNTER — Other Ambulatory Visit: Payer: Self-pay | Admitting: Internal Medicine

## 2016-11-16 ENCOUNTER — Ambulatory Visit (HOSPITAL_COMMUNITY)
Admission: RE | Admit: 2016-11-16 | Discharge: 2016-11-16 | Disposition: A | Discharge status: Home / Self Care | Payer: Medicare HMO | Source: Ambulatory Visit | Attending: Vascular Surgery | Admitting: Vascular Surgery

## 2016-11-16 DIAGNOSIS — M7989 Other specified soft tissue disorders: Secondary | ICD-10-CM | POA: Insufficient documentation

## 2016-11-16 DIAGNOSIS — R600 Localized edema: Secondary | ICD-10-CM | POA: Diagnosis not present

## 2016-11-16 DIAGNOSIS — L97424 Non-pressure chronic ulcer of left heel and midfoot with necrosis of bone: Secondary | ICD-10-CM | POA: Diagnosis present

## 2016-11-18 DIAGNOSIS — E11621 Type 2 diabetes mellitus with foot ulcer: Secondary | ICD-10-CM | POA: Diagnosis not present

## 2016-11-25 DIAGNOSIS — E11621 Type 2 diabetes mellitus with foot ulcer: Secondary | ICD-10-CM | POA: Diagnosis not present

## 2016-12-09 DIAGNOSIS — E11621 Type 2 diabetes mellitus with foot ulcer: Secondary | ICD-10-CM | POA: Diagnosis not present

## 2016-12-16 ENCOUNTER — Encounter (HOSPITAL_BASED_OUTPATIENT_CLINIC_OR_DEPARTMENT_OTHER): Payer: Medicare HMO | Attending: Internal Medicine

## 2016-12-16 DIAGNOSIS — E11621 Type 2 diabetes mellitus with foot ulcer: Secondary | ICD-10-CM | POA: Diagnosis not present

## 2016-12-16 DIAGNOSIS — E1140 Type 2 diabetes mellitus with diabetic neuropathy, unspecified: Secondary | ICD-10-CM | POA: Insufficient documentation

## 2016-12-16 DIAGNOSIS — M1990 Unspecified osteoarthritis, unspecified site: Secondary | ICD-10-CM | POA: Diagnosis not present

## 2016-12-16 DIAGNOSIS — I2510 Atherosclerotic heart disease of native coronary artery without angina pectoris: Secondary | ICD-10-CM | POA: Diagnosis not present

## 2016-12-16 DIAGNOSIS — M86372 Chronic multifocal osteomyelitis, left ankle and foot: Secondary | ICD-10-CM | POA: Diagnosis not present

## 2016-12-16 DIAGNOSIS — E1122 Type 2 diabetes mellitus with diabetic chronic kidney disease: Secondary | ICD-10-CM | POA: Insufficient documentation

## 2016-12-16 DIAGNOSIS — Z992 Dependence on renal dialysis: Secondary | ICD-10-CM | POA: Diagnosis not present

## 2016-12-16 DIAGNOSIS — I252 Old myocardial infarction: Secondary | ICD-10-CM | POA: Insufficient documentation

## 2016-12-16 DIAGNOSIS — I120 Hypertensive chronic kidney disease with stage 5 chronic kidney disease or end stage renal disease: Secondary | ICD-10-CM | POA: Diagnosis not present

## 2016-12-16 DIAGNOSIS — N186 End stage renal disease: Secondary | ICD-10-CM | POA: Insufficient documentation

## 2016-12-16 DIAGNOSIS — L97424 Non-pressure chronic ulcer of left heel and midfoot with necrosis of bone: Secondary | ICD-10-CM | POA: Insufficient documentation

## 2016-12-16 DIAGNOSIS — F17218 Nicotine dependence, cigarettes, with other nicotine-induced disorders: Secondary | ICD-10-CM | POA: Diagnosis not present

## 2016-12-16 DIAGNOSIS — I739 Peripheral vascular disease, unspecified: Secondary | ICD-10-CM | POA: Insufficient documentation

## 2016-12-21 ENCOUNTER — Ambulatory Visit (INDEPENDENT_AMBULATORY_CARE_PROVIDER_SITE_OTHER): Payer: Medicare HMO | Admitting: Orthotics

## 2016-12-21 DIAGNOSIS — L97422 Non-pressure chronic ulcer of left heel and midfoot with fat layer exposed: Secondary | ICD-10-CM

## 2016-12-21 DIAGNOSIS — L02619 Cutaneous abscess of unspecified foot: Secondary | ICD-10-CM

## 2016-12-21 DIAGNOSIS — L03119 Cellulitis of unspecified part of limb: Secondary | ICD-10-CM

## 2016-12-21 DIAGNOSIS — E11621 Type 2 diabetes mellitus with foot ulcer: Secondary | ICD-10-CM | POA: Diagnosis not present

## 2016-12-22 NOTE — Progress Notes (Signed)
Gave patient [place] type shoe with peg-assist; marked ulcer concern with red lipstick...Marland Kitchenoffload ulcer w peg assist.

## 2016-12-23 DIAGNOSIS — E11621 Type 2 diabetes mellitus with foot ulcer: Secondary | ICD-10-CM | POA: Diagnosis not present

## 2016-12-30 DIAGNOSIS — E11621 Type 2 diabetes mellitus with foot ulcer: Secondary | ICD-10-CM | POA: Diagnosis not present

## 2017-01-06 ENCOUNTER — Other Ambulatory Visit (HOSPITAL_COMMUNITY)
Admission: RE | Admit: 2017-01-06 | Discharge: 2017-01-06 | Disposition: A | Discharge status: Home / Self Care | Payer: Medicare HMO | Source: Other Acute Inpatient Hospital | Attending: Internal Medicine | Admitting: Internal Medicine

## 2017-01-06 DIAGNOSIS — E11621 Type 2 diabetes mellitus with foot ulcer: Secondary | ICD-10-CM | POA: Diagnosis present

## 2017-01-09 ENCOUNTER — Inpatient Hospital Stay (HOSPITAL_COMMUNITY)
Admission: EM | Admit: 2017-01-09 | Discharge: 2017-01-19 | DRG: 474 | Disposition: A | Discharge status: Skilled Nursing Facility | Payer: Medicare HMO | Attending: Family Medicine | Admitting: Family Medicine

## 2017-01-09 ENCOUNTER — Emergency Department (HOSPITAL_COMMUNITY): Payer: Medicare HMO

## 2017-01-09 ENCOUNTER — Encounter (HOSPITAL_COMMUNITY): Payer: Self-pay | Admitting: Emergency Medicine

## 2017-01-09 DIAGNOSIS — T8131XA Disruption of external operation (surgical) wound, not elsewhere classified, initial encounter: Secondary | ICD-10-CM | POA: Diagnosis present

## 2017-01-09 DIAGNOSIS — Z951 Presence of aortocoronary bypass graft: Secondary | ICD-10-CM

## 2017-01-09 DIAGNOSIS — L97529 Non-pressure chronic ulcer of other part of left foot with unspecified severity: Secondary | ICD-10-CM

## 2017-01-09 DIAGNOSIS — Y838 Other surgical procedures as the cause of abnormal reaction of the patient, or of later complication, without mention of misadventure at the time of the procedure: Secondary | ICD-10-CM | POA: Diagnosis present

## 2017-01-09 DIAGNOSIS — N2581 Secondary hyperparathyroidism of renal origin: Secondary | ICD-10-CM | POA: Diagnosis present

## 2017-01-09 DIAGNOSIS — N186 End stage renal disease: Secondary | ICD-10-CM

## 2017-01-09 DIAGNOSIS — Z8249 Family history of ischemic heart disease and other diseases of the circulatory system: Secondary | ICD-10-CM

## 2017-01-09 DIAGNOSIS — E1143 Type 2 diabetes mellitus with diabetic autonomic (poly)neuropathy: Secondary | ICD-10-CM | POA: Diagnosis present

## 2017-01-09 DIAGNOSIS — E11610 Type 2 diabetes mellitus with diabetic neuropathic arthropathy: Secondary | ICD-10-CM | POA: Diagnosis present

## 2017-01-09 DIAGNOSIS — E1129 Type 2 diabetes mellitus with other diabetic kidney complication: Secondary | ICD-10-CM | POA: Diagnosis present

## 2017-01-09 DIAGNOSIS — M25462 Effusion, left knee: Secondary | ICD-10-CM

## 2017-01-09 DIAGNOSIS — Z7982 Long term (current) use of aspirin: Secondary | ICD-10-CM

## 2017-01-09 DIAGNOSIS — E11621 Type 2 diabetes mellitus with foot ulcer: Secondary | ICD-10-CM | POA: Diagnosis present

## 2017-01-09 DIAGNOSIS — B9561 Methicillin susceptible Staphylococcus aureus infection as the cause of diseases classified elsewhere: Secondary | ICD-10-CM | POA: Diagnosis present

## 2017-01-09 DIAGNOSIS — T8454XA Infection and inflammatory reaction due to internal left knee prosthesis, initial encounter: Secondary | ICD-10-CM

## 2017-01-09 DIAGNOSIS — G629 Polyneuropathy, unspecified: Secondary | ICD-10-CM

## 2017-01-09 DIAGNOSIS — Z992 Dependence on renal dialysis: Secondary | ICD-10-CM

## 2017-01-09 DIAGNOSIS — D638 Anemia in other chronic diseases classified elsewhere: Secondary | ICD-10-CM | POA: Diagnosis present

## 2017-01-09 DIAGNOSIS — W19XXXA Unspecified fall, initial encounter: Secondary | ICD-10-CM | POA: Diagnosis not present

## 2017-01-09 DIAGNOSIS — E119 Type 2 diabetes mellitus without complications: Secondary | ICD-10-CM

## 2017-01-09 DIAGNOSIS — Z823 Family history of stroke: Secondary | ICD-10-CM

## 2017-01-09 DIAGNOSIS — Y831 Surgical operation with implant of artificial internal device as the cause of abnormal reaction of the patient, or of later complication, without mention of misadventure at the time of the procedure: Secondary | ICD-10-CM | POA: Diagnosis present

## 2017-01-09 DIAGNOSIS — R269 Unspecified abnormalities of gait and mobility: Secondary | ICD-10-CM

## 2017-01-09 DIAGNOSIS — G253 Myoclonus: Secondary | ICD-10-CM | POA: Diagnosis present

## 2017-01-09 DIAGNOSIS — I10 Essential (primary) hypertension: Secondary | ICD-10-CM | POA: Diagnosis present

## 2017-01-09 DIAGNOSIS — M898X9 Other specified disorders of bone, unspecified site: Secondary | ICD-10-CM | POA: Diagnosis present

## 2017-01-09 DIAGNOSIS — Z89412 Acquired absence of left great toe: Secondary | ICD-10-CM

## 2017-01-09 DIAGNOSIS — N189 Chronic kidney disease, unspecified: Secondary | ICD-10-CM

## 2017-01-09 DIAGNOSIS — E1159 Type 2 diabetes mellitus with other circulatory complications: Secondary | ICD-10-CM | POA: Diagnosis present

## 2017-01-09 DIAGNOSIS — I2510 Atherosclerotic heart disease of native coronary artery without angina pectoris: Secondary | ICD-10-CM | POA: Diagnosis not present

## 2017-01-09 DIAGNOSIS — S76112A Strain of left quadriceps muscle, fascia and tendon, initial encounter: Secondary | ICD-10-CM | POA: Diagnosis present

## 2017-01-09 DIAGNOSIS — E1122 Type 2 diabetes mellitus with diabetic chronic kidney disease: Secondary | ICD-10-CM | POA: Diagnosis present

## 2017-01-09 DIAGNOSIS — Z7902 Long term (current) use of antithrombotics/antiplatelets: Secondary | ICD-10-CM

## 2017-01-09 DIAGNOSIS — I252 Old myocardial infarction: Secondary | ICD-10-CM

## 2017-01-09 DIAGNOSIS — L97422 Non-pressure chronic ulcer of left heel and midfoot with fat layer exposed: Secondary | ICD-10-CM | POA: Diagnosis present

## 2017-01-09 DIAGNOSIS — E1151 Type 2 diabetes mellitus with diabetic peripheral angiopathy without gangrene: Secondary | ICD-10-CM | POA: Diagnosis present

## 2017-01-09 DIAGNOSIS — E13621 Other specified diabetes mellitus with foot ulcer: Secondary | ICD-10-CM

## 2017-01-09 DIAGNOSIS — Y92009 Unspecified place in unspecified non-institutional (private) residence as the place of occurrence of the external cause: Secondary | ICD-10-CM

## 2017-01-09 DIAGNOSIS — M00862 Arthritis due to other bacteria, left knee: Secondary | ICD-10-CM

## 2017-01-09 DIAGNOSIS — D631 Anemia in chronic kidney disease: Secondary | ICD-10-CM | POA: Diagnosis present

## 2017-01-09 DIAGNOSIS — E1169 Type 2 diabetes mellitus with other specified complication: Secondary | ICD-10-CM | POA: Diagnosis present

## 2017-01-09 DIAGNOSIS — A4901 Methicillin susceptible Staphylococcus aureus infection, unspecified site: Secondary | ICD-10-CM | POA: Diagnosis present

## 2017-01-09 DIAGNOSIS — I509 Heart failure, unspecified: Secondary | ICD-10-CM | POA: Diagnosis present

## 2017-01-09 DIAGNOSIS — Z8673 Personal history of transient ischemic attack (TIA), and cerebral infarction without residual deficits: Secondary | ICD-10-CM

## 2017-01-09 DIAGNOSIS — D72829 Elevated white blood cell count, unspecified: Secondary | ICD-10-CM

## 2017-01-09 DIAGNOSIS — M86672 Other chronic osteomyelitis, left ankle and foot: Secondary | ICD-10-CM | POA: Diagnosis present

## 2017-01-09 DIAGNOSIS — I132 Hypertensive heart and chronic kidney disease with heart failure and with stage 5 chronic kidney disease, or end stage renal disease: Secondary | ICD-10-CM | POA: Diagnosis present

## 2017-01-09 DIAGNOSIS — E785 Hyperlipidemia, unspecified: Secondary | ICD-10-CM | POA: Diagnosis present

## 2017-01-09 DIAGNOSIS — Z87891 Personal history of nicotine dependence: Secondary | ICD-10-CM

## 2017-01-09 HISTORY — DX: Malignant (primary) neoplasm, unspecified: C80.1

## 2017-01-09 HISTORY — DX: Type 2 diabetes mellitus with foot ulcer: E11.621

## 2017-01-09 HISTORY — DX: Pure hypercholesterolemia, unspecified: E78.00

## 2017-01-09 HISTORY — DX: Non-pressure chronic ulcer of other part of unspecified foot with unspecified severity: L97.509

## 2017-01-09 LAB — BASIC METABOLIC PANEL
Anion gap: 13 (ref 5–15)
BUN: 40 mg/dL — AB (ref 6–20)
CHLORIDE: 93 mmol/L — AB (ref 101–111)
CO2: 27 mmol/L (ref 22–32)
Calcium: 8.9 mg/dL (ref 8.9–10.3)
Creatinine, Ser: 5.99 mg/dL — ABNORMAL HIGH (ref 0.61–1.24)
GFR calc Af Amer: 11 mL/min — ABNORMAL LOW (ref >60)
GFR calc non Af Amer: 9 mL/min — ABNORMAL LOW (ref >60)
GLUCOSE: 124 mg/dL — AB (ref 65–99)
POTASSIUM: 4.1 mmol/L (ref 3.5–5.1)
Sodium: 133 mmol/L — ABNORMAL LOW (ref 135–145)

## 2017-01-09 LAB — CBC WITH DIFFERENTIAL/PLATELET
Basophils Absolute: 0 10*3/uL (ref 0.0–0.1)
Basophils Relative: 0 %
Eosinophils Absolute: 0.1 10*3/uL (ref 0.0–0.7)
Eosinophils Relative: 1 %
HCT: 32.7 % — ABNORMAL LOW (ref 39.0–52.0)
HEMOGLOBIN: 10.3 g/dL — AB (ref 13.0–17.0)
LYMPHS ABS: 0.5 10*3/uL — AB (ref 0.7–4.0)
LYMPHS PCT: 5 %
MCH: 28.5 pg (ref 26.0–34.0)
MCHC: 31.5 g/dL (ref 30.0–36.0)
MCV: 90.6 fL (ref 78.0–100.0)
Monocytes Absolute: 0.5 10*3/uL (ref 0.1–1.0)
Monocytes Relative: 5 %
NEUTROS ABS: 9.8 10*3/uL — AB (ref 1.7–7.7)
NEUTROS PCT: 89 %
Platelets: 162 10*3/uL (ref 150–400)
RBC: 3.61 MIL/uL — AB (ref 4.22–5.81)
RDW: 17.5 % — ABNORMAL HIGH (ref 11.5–15.5)
WBC: 10.9 10*3/uL — AB (ref 4.0–10.5)

## 2017-01-09 LAB — AEROBIC CULTURE  (SUPERFICIAL SPECIMEN)

## 2017-01-09 LAB — AEROBIC CULTURE W GRAM STAIN (SUPERFICIAL SPECIMEN): Gram Stain: NONE SEEN

## 2017-01-09 LAB — CBG MONITORING, ED: GLUCOSE-CAPILLARY: 111 mg/dL — AB (ref 65–99)

## 2017-01-09 MED ORDER — TEMAZEPAM 15 MG PO CAPS: 30.0000 mg | ORAL_CAPSULE | Freq: Every day | ORAL | Status: DC

## 2017-01-09 MED ORDER — GABAPENTIN 300 MG PO CAPS
300.0000 mg | ORAL_CAPSULE | Freq: Three times a day (TID) | ORAL | Status: DC
  Administered 2017-01-09 – 2017-01-15 (×14): 300 mg via ORAL
  Filled 2017-01-09 (×14): qty 1

## 2017-01-09 MED ORDER — ALBUTEROL SULFATE (2.5 MG/3ML) 0.083% IN NEBU: 2.5000 mg | INHALATION_SOLUTION | Freq: Four times a day (QID) | RESPIRATORY_TRACT | Status: DC | PRN

## 2017-01-09 MED ORDER — MONTELUKAST SODIUM 10 MG PO TABS
10.0000 mg | ORAL_TABLET | Freq: Every day | ORAL | Status: DC
  Administered 2017-01-09 – 2017-01-18 (×9): 10 mg via ORAL
  Filled 2017-01-09 (×11): qty 1

## 2017-01-09 MED ORDER — SEVELAMER CARBONATE 800 MG PO TABS
2400.0000 mg | ORAL_TABLET | Freq: Three times a day (TID) | ORAL | Status: DC
  Administered 2017-01-10 – 2017-01-12 (×5): 2400 mg via ORAL
  Filled 2017-01-09: qty 3
  Filled 2017-01-09 (×2): qty 4
  Filled 2017-01-09: qty 3
  Filled 2017-01-09 (×3): qty 4

## 2017-01-09 MED ORDER — ONDANSETRON HCL 4 MG/2ML IJ SOLN: 4.0000 mg | Freq: Four times a day (QID) | INTRAMUSCULAR | Status: DC | PRN

## 2017-01-09 MED ORDER — ATORVASTATIN CALCIUM 40 MG PO TABS
40.0000 mg | ORAL_TABLET | Freq: Every day | ORAL | Status: DC
  Administered 2017-01-11 – 2017-01-19 (×8): 40 mg via ORAL
  Filled 2017-01-09 (×9): qty 1

## 2017-01-09 MED ORDER — ASPIRIN EC 81 MG PO TBEC
162.0000 mg | DELAYED_RELEASE_TABLET | Freq: Every day | ORAL | Status: DC
  Administered 2017-01-10 – 2017-01-19 (×7): 162 mg via ORAL
  Filled 2017-01-09 (×8): qty 2

## 2017-01-09 MED ORDER — ONDANSETRON HCL 4 MG PO TABS: 4.0000 mg | ORAL_TABLET | Freq: Four times a day (QID) | ORAL | Status: DC | PRN

## 2017-01-09 MED ORDER — OXYCODONE-ACETAMINOPHEN 5-325 MG PO TABS
2.0000 | ORAL_TABLET | Freq: Once | ORAL | Status: AC
  Administered 2017-01-09: 2 via ORAL
  Filled 2017-01-09: qty 2

## 2017-01-09 MED ORDER — OXYCODONE-ACETAMINOPHEN 5-325 MG PO TABS: 1.0000 | ORAL_TABLET | ORAL | 0 refills | Status: DC | PRN

## 2017-01-09 MED ORDER — OXYCODONE-ACETAMINOPHEN 5-325 MG PO TABS
1.0000 | ORAL_TABLET | Freq: Four times a day (QID) | ORAL | Status: DC | PRN
  Administered 2017-01-10: 1 via ORAL
  Administered 2017-01-11 (×2): 2 via ORAL
  Filled 2017-01-09: qty 1
  Filled 2017-01-09 (×2): qty 2

## 2017-01-09 MED ORDER — HEPARIN SODIUM (PORCINE) 5000 UNIT/ML IJ SOLN
5000.0000 [IU] | Freq: Three times a day (TID) | INTRAMUSCULAR | Status: DC
  Administered 2017-01-09 – 2017-01-19 (×24): 5000 [IU] via SUBCUTANEOUS
  Filled 2017-01-09 (×25): qty 1

## 2017-01-09 MED ORDER — CLOPIDOGREL BISULFATE 75 MG PO TABS
75.0000 mg | ORAL_TABLET | Freq: Every day | ORAL | Status: DC
  Administered 2017-01-09 – 2017-01-19 (×8): 75 mg via ORAL
  Filled 2017-01-09 (×8): qty 1

## 2017-01-09 MED ORDER — LIDOCAINE HCL (PF) 1 % IJ SOLN
5.0000 mL | Freq: Once | INTRAMUSCULAR | Status: DC
  Filled 2017-01-09: qty 5

## 2017-01-09 MED ORDER — CARVEDILOL 25 MG PO TABS
25.0000 mg | ORAL_TABLET | Freq: Two times a day (BID) | ORAL | Status: DC
  Administered 2017-01-10 – 2017-01-19 (×16): 25 mg via ORAL
  Filled 2017-01-09 (×5): qty 1
  Filled 2017-01-09: qty 2
  Filled 2017-01-09 (×10): qty 1

## 2017-01-09 MED ORDER — ACETAMINOPHEN 650 MG RE SUPP: 650.0000 mg | Freq: Four times a day (QID) | RECTAL | Status: DC | PRN

## 2017-01-09 MED ORDER — ACETAMINOPHEN 325 MG PO TABS: 650.0000 mg | ORAL_TABLET | Freq: Four times a day (QID) | ORAL | Status: DC | PRN

## 2017-01-09 MED ORDER — AMLODIPINE BESYLATE 5 MG PO TABS
5.0000 mg | ORAL_TABLET | Freq: Every day | ORAL | Status: DC
  Administered 2017-01-11 – 2017-01-13 (×3): 5 mg via ORAL
  Filled 2017-01-09 (×4): qty 1

## 2017-01-09 MED ORDER — CLINDAMYCIN HCL 150 MG PO CAPS
300.0000 mg | ORAL_CAPSULE | Freq: Three times a day (TID) | ORAL | Status: DC
  Administered 2017-01-09 – 2017-01-10 (×2): 300 mg via ORAL
  Filled 2017-01-09 (×2): qty 2

## 2017-01-09 NOTE — ED Notes (Signed)
Attempted to ambulate pt with walker. Pt couldn't stand much weight on foot. Notified Jamie(RN) & Anna(RN)

## 2017-01-09 NOTE — ED Notes (Signed)
Matt RN to speak with Dr. Ashok Cordia regarding collection vs discontinuing series of body fluid labs

## 2017-01-09 NOTE — ED Notes (Signed)
Pt brought over without report, no blood pressure in 10 hours, put in room F1

## 2017-01-09 NOTE — ED Provider Notes (Signed)
Patient care assume from [facility], PA-C at shift change.  Please see her note for further. Briefly, patient presented to the emergency room complaining of left foot and knee pain after a fall 3 days ago. He has a chronic foot ulcer as well.  X-rays here showed no acute fracture but the x-ray was concerning for possible septic joint.  No concern clinically for septic joint.  He does have a large joint effusion. Dr. Erlinda Hong from orthopedic surgery was consulted and suggest outpatient follow-up.  Patient also missed dialysis today.  Blood work was reassuring.  At shift change patient is awaiting ambulation after being placed in a knee immobilizer.  Plan is for discharge if he can ambulate.  If he cannot ambulate plan is for admission and for consult back to orthopedic surgery.  Patient was unable to ambulate.  Will plan for admission.  I consulted with hospitalist Dr. Tamala Julian who accepted the patient for admission.  Paged out to orthopedic surgeon Dr. Erlinda Hong to notify that he will be admitted.      Waynetta Pean, PA-C 01/09/17 Leonides Schanz    Charlesetta Shanks, MD 01/14/17 [phone]

## 2017-01-09 NOTE — H&P (Signed)
History and Physical    Matthew Kline IZT:[id number] DOB: [birth year] DOA: 01/09/2017  Referring MD/NP/PA: [name] Primus, PA-C PCP: Donald Prose, MD  Patient coming from: Home  Chief Complaint: Fall  I have personally briefly reviewed patient's old medical records in [place]   HPI: Matthew Kline is a 56 y.o. male with medical history significant of ESRD on HD(M/W/F), CHF, CAD s/p CABG 05/2016, PAD, CVA, anemia, and chronic ulcer with h/o osteomyelitis of the left foot; who presents after having a fall at home a few days ago after his left knee gave way.  He reports falling onto his side with no loss of consciousness or trauma to his head.  However, thereafter patient noted that he was unable to ambulate due to severe pain in the left foot and knee.  He was taking Tylenol at home without much relief.  Due to his symptoms he missed dialysis today.  The ulcer of his left foot is noted to chronically drain fluid and he is followed by the wound care clinic for this.  Dr. Dellia Nims of the wound care clinic had obtained a culture of the fluid 3 days ago.  Review of records shows that the fluid grew back moderate Staphylococcus aureus that was only resistant to erythromycin.  ED Course: Upon admission into the emergency department patient was noted to have vital signs within normal limits.  Labs revealed WBC 10.9, hemoglobin 10.3, sodium 133, potassium 4.1, BUN 40, and creatinine 5.99.  X-rays of the left knee showed a large joint effusion with lucency suspicious for a septic joint.  X-rays of the left foot and ankle showed degenerative changes with soft tissue swelling.  Dr. Erlinda Hong of orthopedics was consulted and was okay with the joint being aspirated as an outpatient if able to ambulate.  Patient was placed in a knee immobilizer.  However, he was unable to ambulate and in lieu of recent missed dialysis it was recommend patient be admitted for observation.  Review of Systems  Constitutional:  Negative for chills and fever.  HENT: Negative for congestion and nosebleeds.   Eyes: Negative for pain and discharge.  Respiratory: Negative for cough and shortness of breath.   Cardiovascular: Positive for leg swelling. Negative for chest pain.  Gastrointestinal: Negative for abdominal pain, nausea and vomiting.  Genitourinary: Negative for dysuria and urgency.  Musculoskeletal: Positive for falls and joint pain.  Skin:       Positive for ulceration of left foot and chronic drainage  Neurological: Negative for dizziness, speech change and focal weakness.  Psychiatric/Behavioral: Negative for substance abuse.    Past Medical History:  Diagnosis Date  . Allergy   . Anemia, unspecified   . Anxiety   . Arthralgia 2010   polyarticular  . Arthritis    "back, knees" (05/09/2014)  . CHF (congestive heart failure) ([place])   . CHF (congestive heart failure) ([place]) 07/25/2009   denies  . Chronic lower back pain   . Coronary artery disease   . Coughing    pt. reports that he has drainage from sinus infection  . Diabetic neuropathy ([place])   . ESRD (end stage renal disease) on dialysis [facility])    started 12/2012; "MWF; [facility] "   . GERD (gastroesophageal reflux disease)    hx "before I lost weight", no problem 9 years  . Hemodialysis access site with mature fistula ([place])   . Hemorrhoids, internal 10/2011   small  . History of blood transfusion    "related  to the anemia"  . Hypertension   . Insomnia, unspecified   . Knee pain, left   . 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 Long term (current) use of anticoagulants   . Myocardial infarction ([place]) 1995  . Orthostatic hypotension   . Osteomyelitis of foot, left, acute ([place])   . Other chronic postoperative pain   . Pneumonia    "probably 4-5 times" (05/09/2014)  . Polymyalgia rheumatica ([place])   . Renal insufficiency   . Sleep apnea    "lost weight; no more problem" (05/09/2014)  . Stroke ([place]) 01/10/06   denies residual  on 05/09/2014  . Type II diabetes mellitus ([place]) dx'd 1995  . Ulcer    diabetic foot   . Unspecified hereditary and idiopathic peripheral neuropathy    feet  . Unspecified osteomyelitis, site unspecified   . Unspecified vitamin D deficiency     Past Surgical History:  Procedure Laterality Date  . ABDOMINAL AORTOGRAM N/A 08/25/2016   Procedure: ABDOMINAL AORTOGRAM;  Surgeon: Wellington [place], MD;  Location: [facility];  Service: Cardiovascular;  Laterality: N/A;  . AMPUTATION  01/21/2012   Procedure: AMPUTATION RAY;  Surgeon: [name], MD;  Location: [facility];  Service: Orthopedics;  Laterality: Left;  Left Foot 4th Ray Amputation  . AMPUTATION Left 05/04/2013   Procedure: AMPUTATION DIGIT;  Surgeon: [name], MD;  Location: [facility];  Service: Orthopedics;  Laterality: Left;  Left Great Toe Amputation at [facility]  . ANTERIOR CERVICAL DECOMP/DISCECTOMY FUSION  02/2011  . BASCILIC VEIN TRANSPOSITION Left 10/19/2012   Procedure: BASCILIC VEIN TRANSPOSITION;  Surgeon: Serafina [place], MD;  Location: [facility];  Service: Vascular;  Laterality: Left;  . CORONARY ARTERY BYPASS GRAFT     x 5 with lima at [place] WITH ANTIBIOTIC SPACERS Left 08/07/2014   Procedure: Replace Left Total Knee Arthroplasty,  Place Antibiotic Spacer;  Surgeon: [name], MD;  Location: [facility];  Service: Orthopedics;  Laterality: Left;  . I&D EXTREMITY Left 05/09/2014   Procedure: Irrigation and Debridement Left Knee and Closure of Total Knee Arthroplasty Incision;  Surgeon: [name], MD;  Location: [facility];  Service: Orthopedics;  Laterality: Left;  . I&D KNEE WITH POLY EXCHANGE Left 05/31/2014   Procedure: IRRIGATION AND DEBRIDEMENT LEFT KNEE, PLACE ANTIBIOTIC BEADS,  POLY EXCHANGE;  Surgeon: [name], MD;  Location: [place];  Service: Orthopedics;  Laterality: Left;  . KNEE ARTHROSCOPY Left 08-25-2012  . LOWER EXTREMITY ANGIOGRAPHY Left 08/25/2016   Procedure: Lower  Extremity Angiography;  Surgeon: Wellington [place], MD;  Location: [facility];  Service: Cardiovascular;  Laterality: Left;  . PERIPHERAL VASCULAR BALLOON ANGIOPLASTY Left 08/25/2016   Procedure: PERIPHERAL VASCULAR BALLOON ANGIOPLASTY;  Surgeon: Wellington [place], MD;  Location: [facility];  Service: Cardiovascular;  Laterality: Left;  lt peroneal and ant tibial arteries cutting balloon  . REFRACTIVE SURGERY Bilateral   . TOE AMPUTATION Bilateral    "I've lost 7 toes over the last 7 years" (05/09/2014)  . TOE SURGERY Left April 2015   Big toe removed on left foot.  . TONSILLECTOMY    . TOTAL KNEE ARTHROPLASTY Left 04/10/2014   Procedure: TOTAL KNEE ARTHROPLASTY;  Surgeon: [name], MD;  Location: [facility];  Service: Orthopedics;  Laterality: Left;  . TOTAL KNEE REVISION Left 10/25/2014   Procedure: LEFT TOTAL KNEE REVISION;  Surgeon: [name], MD;  Location: [facility];  Service: Orthopedics;  Laterality: Left;  . TOTAL KNEE REVISION Left 11/26/2015   Procedure: Removal Left Total Knee Arthroplasty, Hinged Total Knee Arthroplasty;  Surgeon: [name], MD;  Location: [facility];  Service: Orthopedics;  Laterality: Left;  . UVULOPALATOPHARYNGOPLASTY, TONSILLECTOMY AND SEPTOPLASTY  ~ 1989  . WOUND DEBRIDEMENT Left 05/09/2014   Dehiscence Left Total Knee Arthroplasty Incision     reports that he quit smoking about 7 months ago. His smoking use included cigarettes. He has a 3.84 pack-year smoking history. He quit smokeless tobacco use about 2 years ago. He reports that he does not drink alcohol or use drugs.  Allergies  Allergen Reactions  . Morphine And Related Other (See Comments)    hallucinations  . Tygacil [Tigecycline] Nausea And Vomiting and Other (See Comments)    Makes him feel crazy    Family History  Problem Relation Age of Onset  . Hypertension Mother   . Cancer Mother 38       Ovarian  . Heart disease Maternal Aunt   . Stroke Maternal Grandfather      Prior to Admission medications   Medication Sig Start Date End Date Taking? Authorizing Provider  amLODipine (NORVASC) 5 MG tablet Take 5 mg by mouth at bedtime.    [provider]  aspirin 81 MG EC tablet TAKE 1 TABLET EVERY DAY Patient taking differently: Take 162 mg by mouth daily 08/05/15   Gildardo Cranker, DO  atorvastatin (LIPITOR) 40 MG tablet Take 40 mg by mouth daily at 6 PM.    [provider]  carvedilol (COREG) 25 MG tablet Take 25 mg by mouth 2 (two) times daily with a meal.    [provider]  clindamycin (CLEOCIN) 300 MG capsule Take 1 capsule (300 mg total) by mouth 3 (three) times daily. 11/09/16   Waldemar Dickens, MD  clopidogrel (PLAVIX) 75 MG tablet Take 1 tablet (75 mg total) by mouth daily. 08/25/16 08/25/17  Wellington [place], MD  gabapentin (NEURONTIN) 300 MG capsule TAKE ONE CAPSULE BY MOUTH 3 TIMES A DAY Patient taking differently: TAKE ONE CAPSULE BY MOUTH DAILY IN THE EVENING 02/20/16   Suzan Slick, NP  montelukast (SINGULAIR) 10 MG tablet TAKE 1 TABLET (10 MG TOTAL) BY MOUTH AT BEDTIME. 09/01/15   Gildardo Cranker, DO  oxyCODONE-acetaminophen (PERCOCET) 5-325 MG tablet Take 1 tablet by mouth every 4 (four) hours as needed. 01/09/17   Pisciotta, Elmyra Ricks, PA-C  oxyCODONE-acetaminophen (PERCOCET) 5-325 MG tablet Take 1 tablet by mouth every 4 (four) hours as needed. 01/09/17   Pisciotta, Elmyra Ricks, PA-C  sevelamer carbonate (RENVELA) 800 MG tablet Take 2,400-3,200 mg by mouth 3 (three) times daily with meals. Take 2400 to 3200 mg with each me (based on the size of the meal) 3 times daily and take 800 mg with large snacks    [provider]  temazepam (RESTORIL) 30 MG capsule Take 30 mg by mouth at bedtime.  11/10/15   [provider]    Physical Exam:  Constitutional: Older male in moderate discomfort with any movement Vitals:   01/09/17 1220 01/09/17 1416 01/09/17 1420  BP: 113/71    Pulse: 79 77 77  Resp: 16    Temp: 98.4  F (36.9 C)    TempSrc: Oral    SpO2: 93% 96% 98%   Eyes: PERRL, lids and conjunctivae normal ENMT: Mucous membranes are moist. Posterior pharynx clear of any exudate or lesions. Poor dentition.  Neck: normal, supple,  no masses, no thyromegaly Respiratory: clear to auscultation bilaterally, no wheezing, no crackles. Normal respiratory effort. No accessory muscle use.   Cardiovascular: Regular rate and rhythm, no murmurs / rubs / gallops.  +2 pitting extremity edema left leg and +1  edema of the right leg. 2+ pedal pulses. No carotid bruits.  Abdomen: no tenderness, no masses palpated. No hepatosplenomegaly. Bowel sounds positive.  Musculoskeletal: no clubbing / cyanosis.  Large joint effusion of the left leg with decreased range of motion. Skin: Large 3-4 cm ulceration visualized of the plantar aspect of the left foot currently wrapped. Neurologic: CN 2-12 grossly intact. Sensation abnormal, DTR normal. Strength 5/5 in all 4.  Psychiatric: Normal judgment and insight. Alert and oriented x 3. Normal mood.     Labs on Admission: I have personally reviewed following labs and imaging studies  CBC: Recent Labs  Lab 01/09/17 1243  WBC 10.9*  NEUTROABS 9.8*  HGB 10.3*  HCT 32.7*  MCV 90.6  PLT 893   Basic Metabolic Panel: Recent Labs  Lab 01/09/17 1243  NA 133*  K 4.1  CL 93*  CO2 27  GLUCOSE 124*  BUN 40*  CREATININE 5.99*  CALCIUM 8.9   GFR: CrCl cannot be calculated (Unknown ideal weight.). Liver Function Tests: No results for input(s): AST, ALT, ALKPHOS, BILITOT, PROT, ALBUMIN in the last 168 hours. No results for input(s): LIPASE, AMYLASE in the last 168 hours. No results for input(s): AMMONIA in the last 168 hours. Coagulation Profile: No results for input(s): INR, PROTIME in the last 168 hours. Cardiac Enzymes: No results for input(s): CKTOTAL, CKMB, CKMBINDEX, TROPONINI in the last 168 hours. BNP (last 3 results) No results for input(s): PROBNP in the last 8760  hours. HbA1C: No results for input(s): HGBA1C in the last 72 hours. CBG: No results for input(s): GLUCAP in the last 168 hours. Lipid Profile: No results for input(s): CHOL, HDL, LDLCALC, TRIG, CHOLHDL, LDLDIRECT in the last 72 hours. Thyroid Function Tests: No results for input(s): TSH, T4TOTAL, FREET4, T3FREE, THYROIDAB in the last 72 hours. Anemia Panel: No results for input(s): VITAMINB12, FOLATE, FERRITIN, TIBC, IRON, RETICCTPCT in the last 72 hours. Urine analysis:    Component Value Date/Time   COLORURINE YELLOW 01/03/2012 1335   APPEARANCEUR Clear 07/12/2013 0918   LABSPEC 1.021 01/03/2012 1335   PHURINE 6.0 01/03/2012 1335   GLUCOSEU Trace (A) 07/12/2013 0918   HGBUR TRACE (A) 01/03/2012 1335   BILIRUBINUR Negative 07/12/2013 0918   KETONESUR NEGATIVE 01/03/2012 1335   PROTEINUR 3+ (A) 07/12/2013 0918   PROTEINUR >300 (A) 01/03/2012 1335   UROBILINOGEN 0.2 01/03/2012 1335   NITRITE Negative 07/12/2013 0918   NITRITE NEGATIVE 01/03/2012 1335   LEUKOCYTESUR Negative 07/12/2013 0918   Sepsis Labs: Recent Results (from the past 240 hour(s))  Aerobic Culture (superficial specimen)     Status: None   Collection Time: 01/06/17 12:32 PM  Result Value Ref Range Status   Specimen Description FOOT LEFT PLANTAR  Final   Special Requests NONE  Final   Gram Stain   Final    NO WBC SEEN ABUNDANT GRAM POSITIVE COCCI IN PAIRS IN CLUSTERS ABUNDANT GRAM NEGATIVE RODS FEW GRAM POSITIVE RODS    Culture   Final    MODERATE STAPHYLOCOCCUS AUREUS WITHIN MIXED ORGANISMS Performed at [facility], [place] [address], [place], [place] [address]    Report Status 01/09/2017 FINAL  Final   Organism ID, Bacteria STAPHYLOCOCCUS AUREUS  Final      Susceptibility  Staphylococcus aureus - MIC*    CIPROFLOXACIN <=0.5 SENSITIVE Sensitive     ERYTHROMYCIN >=8 RESISTANT Resistant     GENTAMICIN <=0.5 SENSITIVE Sensitive     OXACILLIN <=0.25 SENSITIVE Sensitive     TETRACYCLINE <=1  SENSITIVE Sensitive     VANCOMYCIN <=0.5 SENSITIVE Sensitive     TRIMETH/SULFA <=10 SENSITIVE Sensitive     CLINDAMYCIN <=0.25 SENSITIVE Sensitive     RIFAMPIN <=0.5 SENSITIVE Sensitive     Inducible Clindamycin NEGATIVE Sensitive     * MODERATE STAPHYLOCOCCUS AUREUS     Radiological Exams on Admission: Dg Ankle Complete Left  Result Date: 01/09/2017 CLINICAL DATA:  Patient reports recent fall, patient reports already having swelling of his entire left leg, reports he has noticed more swelling, reports severe pain to left side, reports not being able to put pressure on left knee, general lef.*comment was truncated* EXAM: LEFT ANKLE COMPLETE - 3+ VIEW COMPARISON:  Foot films dictated separately.  Prior of 11/09/2016. FINDINGS: Diffuse soft tissue swelling about the ankle. Degenerate changes about the hindfoot. No acute fracture or dislocation. IMPRESSION: Soft tissue swelling with degenerative changes.  No acute findings. Electronically Signed   By: Abigail Miyamoto M.D.   On: 01/09/2017 13:40   Dg Knee Complete 4 Views Left  Result Date: 01/09/2017 CLINICAL DATA:  Recent fall with left knee pain EXAM: LEFT KNEE - COMPLETE 4+ VIEW COMPARISON:  08/07/2014 FINDINGS: Large joint effusion is noted with some areas of lucency within somewhat suspicious for septic joint. Displacement of the patella is noted by the effusion. No definitive acute fracture is seen. Diffuse soft tissue swelling is noted below the knee joint. IMPRESSION: Large joint effusion with some areas of lucency suspicious for a septic joint. Clinical correlation is recommended. Arthrocentesis may be helpful. No definitive acute fracture is seen. Electronically Signed   By: Inez Catalina M.D.   On: 01/09/2017 13:40   Dg Foot Complete Left  Result Date: 01/09/2017 CLINICAL DATA:  Recent fall.  Increased swelling. EXAM: LEFT FOOT - COMPLETE 3+ VIEW COMPARISON:  11/09/2016 FINDINGS: Re- demonstration of prior first phalanx and fourth  transmetatarsal amputations. Vascular calcifications. Degenerate changes of the midfoot. No acute fracture or dislocation. No gross osseous destruction. No soft tissue gas or well-defined soft tissue ulcer. Diffuse soft tissue swelling, increased since the prior exam. No radiopaque foreign object. IMPRESSION: Postsurgical and degenerative changes, with progressive soft tissue swelling. No definite acute superimposed osseous abnormality. Electronically Signed   By: Abigail Miyamoto M.D.   On: 01/09/2017 13:39    X-ray of left knee: Independently reviewed.  Large effusion of the left knee  Assessment/Plan Fall initial encounter, gait disturbance: Acute.  Patient fell at home prior to arrival, and reported severe pain at the left leg foot.  Unable to ambulate with crutches/walker. - Admit to MedSurg bed - Oxycodone prn pain - Physical therapy to eval and treat  Left knee effusion: Acute.  Patient with large left knee effusion.  X-rays reveal large left knee effusion with areas of lucency suspicious for septic joint.  Orthopedics Xu notified regarding patient, but felt less likely septic joint.  Will see patient for aspiration of the knee in a.m.  - Ensure orthopedics will see patient in a.m. - Follow-up joint aspiration fluid analysis   Diabetic foot ulcer, positive Staphylococcus aureus: Patient followed by wound care center in the outpatient setting by Dr. Dellia Nims.  Wound cultures from 12/27 were positive for moderate Staphylococcus aureus resistant only to erythromycin. - Clindamycin  started   - Wound care consult - Considered discussing antibiotic with Dr. Dellia Nims in a.m.  Leukocytosis: Acute.  WBC elevated 10.9 on admission..  Suspect symptoms could be secondary to above. - Recheck CBC in a.m.  ESRD on HD: Patient missed hemodialysis due to inability to ambulate.  Denies any significant symptoms of shortness of breath.  Labs reveal potassium 4.1, BUN 40, and creatinine 5.99.  - Continue Renvela    - Message left for need for HD in a.m.  Essential hypertension - Continue Coreg and amlodipine   Anemia of chronic disease: Hemoglobin 10.3 on admission.  Denies any complaints of bleeding.  CAD, PAD: Patient status post CABG in 05/2016 and has had previous endovascular intervention for peripheral artery disease. - Continue Plavix and aspirin  Hyperlipidemia - Continue atorvastatin  Diabetes mellitus type 2: Patient currently controlled with diet.  Last hemoglobin A1c 6.4 on 11/08/2016. - Continue to monitor  Peripheral neuropathy - Continue gabapentin    DVT prophylaxis: heparin Code Status: Full  Family Communication: Discussed plan of care with the patient and family present at bedside Disposition Plan: TBD  Consults called: Ortho  Admission status: observation  Norval Morton MD [facility] Pager [phone]   If 7PM-7AM, please contact night-coverage [email or web address] Password [email or web address]  01/09/2017, 7:32 PM

## 2017-01-09 NOTE — ED Notes (Signed)
Respiratory at bedside.

## 2017-01-09 NOTE — ED Notes (Signed)
Dialysis for reschedule [phone] [place]

## 2017-01-09 NOTE — ED Provider Notes (Signed)
[facility] EMERGENCY DEPARTMENT Provider Note   CSN: [id number] Arrival date & time: 01/09/17  1220     History   Chief Complaint Chief Complaint  Patient presents with  . Foot Pain  . dialysis scheduled today  . Knee Pain    HPI   Blood pressure 113/71, pulse 79, temperature 98.4 F (36.9 C), temperature source Oral, resp. rate 16, SpO2 93 %.  Matthew Kline is a 56 y.o. male by EMS for severe left foot and left knee pain status post fall 3 days ago.  He states that he has had a chronic ulcer in the left foot which is being managed by wound care, he is not on any antibiotics for this.  He states he is unclear why he fell but there was no loss of consciousness and no prodrome of chest pain, dizziness or shortness of breath.  He states that the pain increased sharply after the fall and is gotten so severe that he cannot bear weight on the foot, he lives alone he had to call his son over to help him get around.  He is taking Tylenol at home with little relief.  He is scheduled for dialysis today but could not go because the pain is too severe.  He denies any shortness of breath, chest pain, fever, chills, nausea, vomiting.  Past Medical History:  Diagnosis Date  . Allergy   . Anemia, unspecified   . Anxiety   . Arthralgia 2010   polyarticular  . Arthritis    "back, knees" (05/09/2014)  . CHF (congestive heart failure) ([place])   . CHF (congestive heart failure) ([place]) 07/25/2009   denies  . Chronic lower back pain   . Coronary artery disease   . Coughing    pt. reports that he has drainage from sinus infection  . Diabetic neuropathy ([place])   . ESRD (end stage renal disease) on dialysis [facility])    started 12/2012; "MWF; [place] "   . GERD (gastroesophageal reflux disease)    hx "before I lost weight", no problem 9 years  . Hemodialysis access site with mature fistula ([place])   . Hemorrhoids, internal 10/2011   small  . History of blood transfusion      "related to the anemia"  . Hypertension   . Insomnia, unspecified   . Knee pain, left   . 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 Long term (current) use of anticoagulants   . Myocardial infarction ([place]) 1995  . Orthostatic hypotension   . Osteomyelitis of foot, left, acute ([place])   . Other chronic postoperative pain   . Pneumonia    "probably 4-5 times" (05/09/2014)  . Polymyalgia rheumatica ([place])   . Renal insufficiency   . Sleep apnea    "lost weight; no more problem" (05/09/2014)  . Stroke ([place]) 01/10/06   denies residual on 05/09/2014  . Type II diabetes mellitus ([place]) dx'd 1995  . Ulcer    diabetic foot   . Unspecified hereditary and idiopathic peripheral neuropathy    feet  . Unspecified osteomyelitis, site unspecified   . Unspecified vitamin D deficiency     Patient Active Problem List   Diagnosis Date Noted  . Fever, unknown origin 11/09/2016  . Critical lower limb ischemia 08/25/2016  . Ulcer of left midfoot with fat layer exposed ([place]) 08/13/2016  . Diabetic ulcer of left midfoot associated with type 2 diabetes mellitus, with fat layer exposed ([place]) 07/25/2016  . Peripheral  neuropathy 07/22/2016  . Tobacco abuse 07/22/2016  . CAD in native artery 05/18/2016  . CAD, multiple vessel 05/11/2016  . Positive cardiac stress test 05/11/2016  . Abnormal stress test 04/30/2016  . Pre-transplant evaluation for kidney transplant 04/30/2016  . S/P revision of total knee 11/26/2015  . Pain in the chest   . Acute on chronic diastolic heart failure ([place]) 07/05/2015  . Volume overload 07/04/2015  . Shortness of breath 07/04/2015  . Hypoxemia 07/04/2015  . Elevated troponin   . End-stage renal disease on hemodialysis ([place])   . Hypervolemia   . 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 Septic joint of left knee joint ([place]) 08/07/2014  . Tachycardia 07/24/2014  . Acute upper respiratory infection 07/24/2014  . Sepsis ([place]) 07/14/2014  . ESRD on dialysis  ([place]) 07/14/2014  . Type II diabetes mellitus ([place]) 07/14/2014  . Anemia in chronic kidney disease 07/14/2014  . Congestive heart failure (CHF) ([place]) 07/13/2014  . Infection of total knee replacement ([place]) 05/31/2014  . Surgical wound dehiscence 05/09/2014  . Dehiscence of closure of skin 05/09/2014  . Total knee replacement status 04/10/2014  . Diabetes mellitus with renal manifestations, controlled ([place]) 10/24/2013  . Hypertensive renal disease 06/27/2013  . DM type 2 causing vascular disease ([place]) 06/27/2013  . Erectile dysfunction 06/27/2013  . Depression 06/27/2013  . Claudication of left lower extremity ([place]) 12/19/2012  . Essential hypertension, benign 12/19/2012  . Sinusitis, acute maxillary 11/22/2012  . Otitis, externa, infective 11/14/2012  . Leg edema, left 11/14/2012  . End stage renal disease ([place]) 10/02/2012  . Controlled type 2 DM with proteinuria or microalbuminuria 09/19/2012  . GERD (gastroesophageal reflux disease) 09/19/2012  . Leukocytosis, unspecified 09/19/2012  . Lacunar infarction 08/17/2012  . Polymyalgia rheumatica ([place]) 08/17/2012  . 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 Essential hypertension 05/10/2012  . Vitamin D deficiency 05/10/2012  . Diabetes mellitus due to underlying condition ([place]) 05/10/2012  . Hyperlipidemia LDL goal <100 05/10/2012  . Anemia of chronic disease 05/10/2012  . Screening for prostate cancer 05/10/2012  . Chronic kidney disease (CKD), stage IV (severe) ([place]) 05/10/2012  . Peripheral autonomic neuropathy due to DM ([place]) 05/10/2012  . Callus of foot 05/10/2012  . Urgency of urination 05/10/2012  . Hyperkalemia 05/10/2012  . Candidiasis of the esophagus 10/12/2011  . Internal hemorrhoids without mention of complication 53/66/4403  . Pre-syncope 07/25/2009  . DJD (degenerative joint disease) of cervical spine 02/17/2009    Past Surgical History:  Procedure Laterality Date  . ABDOMINAL AORTOGRAM N/A 08/25/2016   Procedure:  ABDOMINAL AORTOGRAM;  Surgeon: Wellington Hampshire, MD;  Location: [facility];  Service: Cardiovascular;  Laterality: N/A;  . AMPUTATION  01/21/2012   Procedure: AMPUTATION RAY;  Surgeon: [name], MD;  Location: [facility];  Service: Orthopedics;  Laterality: Left;  Left Foot 4th Ray Amputation  . AMPUTATION Left 05/04/2013   Procedure: AMPUTATION DIGIT;  Surgeon: [name], MD;  Location: [facility];  Service: Orthopedics;  Laterality: Left;  Left Great Toe Amputation at [facility]  . ANTERIOR CERVICAL DECOMP/DISCECTOMY FUSION  02/2011  . BASCILIC VEIN TRANSPOSITION Left 10/19/2012   Procedure: BASCILIC VEIN TRANSPOSITION;  Surgeon: Serafina Mitchell, MD;  Location: [facility];  Service: Vascular;  Laterality: Left;  . CORONARY ARTERY BYPASS GRAFT     x 5 with lima at [place] WITH ANTIBIOTIC SPACERS Left 08/07/2014   Procedure: Replace Left Total Knee Arthroplasty,  Place  Antibiotic Spacer;  Surgeon: [name], MD;  Location: [facility];  Service: Orthopedics;  Laterality: Left;  . I&D EXTREMITY Left 05/09/2014   Procedure: Irrigation and Debridement Left Knee and Closure of Total Knee Arthroplasty Incision;  Surgeon: [name], MD;  Location: [facility];  Service: Orthopedics;  Laterality: Left;  . I&D KNEE WITH POLY EXCHANGE Left 05/31/2014   Procedure: IRRIGATION AND DEBRIDEMENT LEFT KNEE, PLACE ANTIBIOTIC BEADS,  POLY EXCHANGE;  Surgeon: [name], MD;  Location: [facility];  Service: Orthopedics;  Laterality: Left;  . KNEE ARTHROSCOPY Left 08-25-2012  . LOWER EXTREMITY ANGIOGRAPHY Left 08/25/2016   Procedure: Lower Extremity Angiography;  Surgeon: Wellington Hampshire, MD;  Location: [facility];  Service: Cardiovascular;  Laterality: Left;  . PERIPHERAL VASCULAR BALLOON ANGIOPLASTY Left 08/25/2016   Procedure: PERIPHERAL VASCULAR BALLOON ANGIOPLASTY;  Surgeon: Wellington Hampshire, MD;  Location: [facility];  Service: Cardiovascular;  Laterality: Left;  lt  peroneal and ant tibial arteries cutting balloon  . REFRACTIVE SURGERY Bilateral   . TOE AMPUTATION Bilateral    "I've lost 7 toes over the last 7 years" (05/09/2014)  . TOE SURGERY Left April 2015   Big toe removed on left foot.  . TONSILLECTOMY    . TOTAL KNEE ARTHROPLASTY Left 04/10/2014   Procedure: TOTAL KNEE ARTHROPLASTY;  Surgeon: [name], MD;  Location: [facility];  Service: Orthopedics;  Laterality: Left;  . TOTAL KNEE REVISION Left 10/25/2014   Procedure: LEFT TOTAL KNEE REVISION;  Surgeon: [name], MD;  Location: [facility];  Service: Orthopedics;  Laterality: Left;  . TOTAL KNEE REVISION Left 11/26/2015   Procedure: Removal Left Total Knee Arthroplasty, Hinged Total Knee Arthroplasty;  Surgeon: [name], MD;  Location: [facility];  Service: Orthopedics;  Laterality: Left;  . UVULOPALATOPHARYNGOPLASTY, TONSILLECTOMY AND SEPTOPLASTY  ~ 1989  . WOUND DEBRIDEMENT Left 05/09/2014   Dehiscence Left Total Knee Arthroplasty Incision       Home Medications    Prior to Admission medications   Medication Sig Start Date End Date Taking? Authorizing Provider  amLODipine (NORVASC) 5 MG tablet Take 5 mg by mouth at bedtime.    [provider]  aspirin 81 MG EC tablet TAKE 1 TABLET EVERY DAY Patient taking differently: Take 162 mg by mouth daily 08/05/15   Gildardo Cranker, DO  atorvastatin (LIPITOR) 40 MG tablet Take 40 mg by mouth daily at 6 PM.    [provider]  carvedilol (COREG) 25 MG tablet Take 25 mg by mouth 2 (two) times daily with a meal.    [provider]  clindamycin (CLEOCIN) 300 MG capsule Take 1 capsule (300 mg total) by mouth 3 (three) times daily. 11/09/16   Waldemar Dickens, MD  clopidogrel (PLAVIX) 75 MG tablet Take 1 tablet (75 mg total) by mouth daily. 08/25/16 08/25/17  Wellington Hampshire, MD  gabapentin (NEURONTIN) 300 MG capsule TAKE ONE CAPSULE BY MOUTH 3 TIMES A DAY Patient taking differently: TAKE ONE CAPSULE BY MOUTH DAILY IN THE  EVENING 02/20/16   Suzan Slick, NP  montelukast (SINGULAIR) 10 MG tablet TAKE 1 TABLET (10 MG TOTAL) BY MOUTH AT BEDTIME. 09/01/15   Gildardo Cranker, DO  oxyCODONE-acetaminophen (PERCOCET) 5-325 MG tablet Take 1 tablet by mouth every 4 (four) hours as needed. 01/09/17   Devron Cohick, Elmyra Ricks, PA-C  oxyCODONE-acetaminophen (PERCOCET) 5-325 MG tablet Take 1 tablet by mouth every 4 (four) hours as needed. 01/09/17   Shreyas Piatkowski, Elmyra Ricks,  PA-C  sevelamer carbonate (RENVELA) 800 MG tablet Take 2,400-3,200 mg by mouth 3 (three) times daily with meals. Take 2400 to 3200 mg with each me (based on the size of the meal) 3 times daily and take 800 mg with large snacks    [provider]  temazepam (RESTORIL) 30 MG capsule Take 30 mg by mouth at bedtime.  11/10/15   [provider]    Family History Family History  Problem Relation Age of Onset  . Hypertension Mother   . Cancer Mother 64       Ovarian  . Heart disease Maternal Aunt   . Stroke Maternal Grandfather     Social History Social History   Tobacco Use  . Smoking status: Former Smoker    Packs/day: 0.12    Years: 32.00    Pack years: 3.84    Types: Cigarettes    Last attempt to quit: 05/28/2016    Years since quitting: 0.6  . Smokeless tobacco: Former Systems developer    Quit date: 10/06/2014  . Tobacco comment: 3 -4 cig daily   Substance Use Topics  . Alcohol use: No    Alcohol/week: 0.0 oz  . Drug use: No     Allergies   Morphine and related and Tygacil [tigecycline]   Review of Systems Review of Systems  A complete review of systems was obtained and all systems are negative except as noted in the HPI and PMH.    Physical Exam Updated Vital Signs BP 113/71 (BP Location: Right Arm)   Pulse 77   Temp 98.4 F (36.9 C) (Oral)   Resp 16   SpO2 98%   Physical Exam  Constitutional: He is oriented to person, place, and time. He appears well-developed and well-nourished. No distress.  HENT:  Head: Normocephalic and  atraumatic.  Mouth/Throat: Oropharynx is clear and moist.  Eyes: Conjunctivae and EOM are normal. Pupils are equal, round, and reactive to light.  Neck: Normal range of motion. No JVD present. No tracheal deviation present.  Cardiovascular: Normal rate, regular rhythm and intact distal pulses.  Fistula to left brachial area with good thrill  Pulmonary/Chest: Effort normal and breath sounds normal. No stridor. No respiratory distress. He has no wheezes. He has no rales. He exhibits no tenderness.  Abdominal: Soft. He exhibits no distension and no mass. There is no tenderness. There is no rebound and no guarding.  Musculoskeletal: He exhibits edema. He exhibits no tenderness.  2+ pitting edema to right lower extremity 3+ pitting edema to left lower extremity, ulceration to the sole of the foot as photographed with foul-smelling discharge (patient states this is normal) he is concerned about an area of duskiness which is new.   Abrasion to left knee with significant effusion and reduced range of motion, slight warmth to the entire left lower extremity with no overlying induration.   Neurological: He is alert and oriented to person, place, and time.  Skin: Skin is warm. He is not diaphoretic.  Psychiatric: He has a normal mood and affect.  Nursing note and vitals reviewed.        ED Treatments / Results  Labs (all labs ordered are listed, but only abnormal results are displayed) Labs Reviewed  CBC WITH DIFFERENTIAL/PLATELET - Abnormal; Notable for the following components:      Result Value   WBC 10.9 (*)    RBC 3.61 (*)    Hemoglobin 10.3 (*)    HCT 32.7 (*)    RDW 17.5 (*)  Neutro Abs 9.8 (*)    Lymphs Abs 0.5 (*)    All other components within normal limits  BASIC METABOLIC PANEL - Abnormal; Notable for the following components:   Sodium 133 (*)    Chloride 93 (*)    Glucose, Bld 124 (*)    BUN 40 (*)    Creatinine, Ser 5.99 (*)    GFR calc non Af Amer 9 (*)    GFR calc  Af Amer 11 (*)    All other components within normal limits  BODY FLUID CULTURE  GRAM STAIN  GLUCOSE, BODY FLUID OTHER  PROTEIN, BODY FLUID (OTHER)  SYNOVIAL CELL COUNT + DIFF, W/ CRYSTALS  URIC ACID, BODY FLUID    EKG  EKG Interpretation None       Radiology Dg Ankle Complete Left  Result Date: 01/09/2017 CLINICAL DATA:  Patient reports recent fall, patient reports already having swelling of his entire left leg, reports he has noticed more swelling, reports severe pain to left side, reports not being able to put pressure on left knee, general lef.*comment was truncated* EXAM: LEFT ANKLE COMPLETE - 3+ VIEW COMPARISON:  Foot films dictated separately.  Prior of 11/09/2016. FINDINGS: Diffuse soft tissue swelling about the ankle. Degenerate changes about the hindfoot. No acute fracture or dislocation. IMPRESSION: Soft tissue swelling with degenerative changes.  No acute findings. Electronically Signed   By: Abigail Miyamoto M.D.   On: 01/09/2017 13:40   Dg Knee Complete 4 Views Left  Result Date: 01/09/2017 CLINICAL DATA:  Recent fall with left knee pain EXAM: LEFT KNEE - COMPLETE 4+ VIEW COMPARISON:  08/07/2014 FINDINGS: Large joint effusion is noted with some areas of lucency within somewhat suspicious for septic joint. Displacement of the patella is noted by the effusion. No definitive acute fracture is seen. Diffuse soft tissue swelling is noted below the knee joint. IMPRESSION: Large joint effusion with some areas of lucency suspicious for a septic joint. Clinical correlation is recommended. Arthrocentesis may be helpful. No definitive acute fracture is seen. Electronically Signed   By: Inez Catalina M.D.   On: 01/09/2017 13:40   Dg Foot Complete Left  Result Date: 01/09/2017 CLINICAL DATA:  Recent fall.  Increased swelling. EXAM: LEFT FOOT - COMPLETE 3+ VIEW COMPARISON:  11/09/2016 FINDINGS: Re- demonstration of prior first phalanx and fourth transmetatarsal amputations.  Vascular calcifications. Degenerate changes of the midfoot. No acute fracture or dislocation. No gross osseous destruction. No soft tissue gas or well-defined soft tissue ulcer. Diffuse soft tissue swelling, increased since the prior exam. No radiopaque foreign object. IMPRESSION: Postsurgical and degenerative changes, with progressive soft tissue swelling. No definite acute superimposed osseous abnormality. Electronically Signed   By: Abigail Miyamoto M.D.   On: 01/09/2017 13:39    Procedures Procedures (including critical care time)  Medications Ordered in ED Medications  lidocaine (PF) (XYLOCAINE) 1 % injection 5 mL (not administered)  oxyCODONE-acetaminophen (PERCOCET/ROXICET) 5-325 MG per tablet 2 tablet (2 tablets Oral Given 01/09/17 1345)     Initial Impression / Assessment and Plan / ED Course  I have reviewed the triage vital signs and the nursing notes.  Pertinent labs & imaging results that were available during my care of the patient were reviewed by me and considered in my medical decision making (see chart for details).    Vitals:   01/09/17 1220 01/09/17 1416 01/09/17 1420  BP: 113/71    Pulse: 79 77 77  Resp: 16    Temp: 98.4 F (36.9 C)  TempSrc: Oral    SpO2: 93% 96% 98%    Medications  lidocaine (PF) (XYLOCAINE) 1 % injection 5 mL (not administered)  oxyCODONE-acetaminophen (PERCOCET/ROXICET) 5-325 MG per tablet 2 tablet (2 tablets Oral Given 01/09/17 1345)    Matthew Kline is 56 y.o. male presenting with severe pain to left lower extremity foot and knee status post fall several days ago.  Patient cannot bear weight on the area.  There is a diabetic ulcer there which he states has a new area of duskiness.  Foul-smelling discharge is not new, he is on any chronic antibiotics for this.  Is being managed by wound care.  Clinically no volume overload, vital signs reassuring, will obtain basic blood work and x-rays of the left lower extremity.  Percocet for pain  control.  X-ray surprisingly shows a lucency which radiology reads as concerning for septic joint.  Discussed with orthopedist Dr. Erlinda Hong was evaluated the x-ray and recommends not tapping it emergently in the ED.  His preference is that this patient be discharged home and he see them in the office tomorrow for arthrocentesis under more controlled circumstance.  I do not know if this patient can be discharged home, I will update him if this patient is admitted.  Culture from December 27 has resulted, no white blood cells, mixed organisms.  I do not think he necessarily needs emergent antibiotics given this finding  Blood work with no significant abnormality requiring emergent dialysis at this time.  Wound will be dressed, patient will be placed in a knee immobilizer and at Percocet we will have a trial of ambulation, if patient is able to ambulate he will be discharged home, otherwise he will need admission to the hospital and orthopedic evaluation as an inpatient.  Case signed out to PA Dansie at shift change.   Final Clinical Impressions(s) / ED Diagnoses   Final diagnoses:  Fall, initial encounter  Knee effusion, left  Diabetic ulcer of left foot associated with other specified diabetes mellitus, unspecified part of foot, unspecified ulcer stage [facility])    ED Discharge Orders        Ordered    oxyCODONE-acetaminophen (PERCOCET) 5-325 MG tablet  Every 4 hours PRN     01/09/17 1607    oxyCODONE-acetaminophen (PERCOCET) 5-325 MG tablet  Every 4 hours PRN     01/09/17 1610       Jimya Ciani, [place], Hershal Coria 01/09/17 [place], MD 01/14/17 1739

## 2017-01-09 NOTE — ED Notes (Signed)
Consulting MD at bedside (Dr. Tamala Julian).

## 2017-01-09 NOTE — Discharge Instructions (Signed)
It is very important that you see Dr. Sharol Given on Monday  Do not hesitate to return to the emergency department for any new, worsening or concerning symptoms.  Take percocet for breakthrough pain, do not drink alcohol, drive, care for children or do other critical tasks while taking percocet.  Please be very careful not to fall! The pain medication puts you at risk for falls. Please rest as much as possible and try to not stay alone.

## 2017-01-09 NOTE — ED Triage Notes (Signed)
Per EMS- pt missed his dialysis today because his left knee and left foot are causing pain, hx of diabetic wound to left foot, arrives with foot in bandage and boot. Pt states he is now unable to bare weight due to pain.

## 2017-01-10 ENCOUNTER — Other Ambulatory Visit (INDEPENDENT_AMBULATORY_CARE_PROVIDER_SITE_OTHER): Payer: Self-pay | Admitting: Family

## 2017-01-10 ENCOUNTER — Other Ambulatory Visit: Payer: Self-pay

## 2017-01-10 ENCOUNTER — Encounter (HOSPITAL_COMMUNITY): Payer: Self-pay | Admitting: General Practice

## 2017-01-10 DIAGNOSIS — M898X9 Other specified disorders of bone, unspecified site: Secondary | ICD-10-CM | POA: Diagnosis present

## 2017-01-10 DIAGNOSIS — T8454XA Infection and inflammatory reaction due to internal left knee prosthesis, initial encounter: Secondary | ICD-10-CM | POA: Diagnosis present

## 2017-01-10 DIAGNOSIS — I2510 Atherosclerotic heart disease of native coronary artery without angina pectoris: Secondary | ICD-10-CM | POA: Diagnosis present

## 2017-01-10 DIAGNOSIS — Z8673 Personal history of transient ischemic attack (TIA), and cerebral infarction without residual deficits: Secondary | ICD-10-CM | POA: Diagnosis not present

## 2017-01-10 DIAGNOSIS — E11610 Type 2 diabetes mellitus with diabetic neuropathic arthropathy: Secondary | ICD-10-CM | POA: Diagnosis present

## 2017-01-10 DIAGNOSIS — T8450XA Infection and inflammatory reaction due to unspecified internal joint prosthesis, initial encounter: Secondary | ICD-10-CM | POA: Diagnosis not present

## 2017-01-10 DIAGNOSIS — L97529 Non-pressure chronic ulcer of other part of left foot with unspecified severity: Secondary | ICD-10-CM

## 2017-01-10 DIAGNOSIS — S76112A Strain of left quadriceps muscle, fascia and tendon, initial encounter: Secondary | ICD-10-CM | POA: Diagnosis present

## 2017-01-10 DIAGNOSIS — N186 End stage renal disease: Secondary | ICD-10-CM | POA: Diagnosis present

## 2017-01-10 DIAGNOSIS — Y831 Surgical operation with implant of artificial internal device as the cause of abnormal reaction of the patient, or of later complication, without mention of misadventure at the time of the procedure: Secondary | ICD-10-CM | POA: Diagnosis present

## 2017-01-10 DIAGNOSIS — D638 Anemia in other chronic diseases classified elsewhere: Secondary | ICD-10-CM | POA: Diagnosis not present

## 2017-01-10 DIAGNOSIS — Y92009 Unspecified place in unspecified non-institutional (private) residence as the place of occurrence of the external cause: Secondary | ICD-10-CM | POA: Diagnosis not present

## 2017-01-10 DIAGNOSIS — E11621 Type 2 diabetes mellitus with foot ulcer: Secondary | ICD-10-CM | POA: Diagnosis present

## 2017-01-10 DIAGNOSIS — W19XXXA Unspecified fall, initial encounter: Secondary | ICD-10-CM | POA: Diagnosis present

## 2017-01-10 DIAGNOSIS — I252 Old myocardial infarction: Secondary | ICD-10-CM | POA: Diagnosis not present

## 2017-01-10 DIAGNOSIS — G253 Myoclonus: Secondary | ICD-10-CM | POA: Diagnosis present

## 2017-01-10 DIAGNOSIS — M25462 Effusion, left knee: Secondary | ICD-10-CM

## 2017-01-10 DIAGNOSIS — I509 Heart failure, unspecified: Secondary | ICD-10-CM | POA: Diagnosis present

## 2017-01-10 DIAGNOSIS — I132 Hypertensive heart and chronic kidney disease with heart failure and with stage 5 chronic kidney disease, or end stage renal disease: Secondary | ICD-10-CM | POA: Diagnosis present

## 2017-01-10 DIAGNOSIS — D631 Anemia in chronic kidney disease: Secondary | ICD-10-CM | POA: Diagnosis present

## 2017-01-10 DIAGNOSIS — N185 Chronic kidney disease, stage 5: Secondary | ICD-10-CM | POA: Diagnosis not present

## 2017-01-10 DIAGNOSIS — S76119S Strain of unspecified quadriceps muscle, fascia and tendon, sequela: Secondary | ICD-10-CM | POA: Diagnosis not present

## 2017-01-10 DIAGNOSIS — D72829 Elevated white blood cell count, unspecified: Secondary | ICD-10-CM | POA: Diagnosis not present

## 2017-01-10 DIAGNOSIS — E1169 Type 2 diabetes mellitus with other specified complication: Secondary | ICD-10-CM | POA: Diagnosis present

## 2017-01-10 DIAGNOSIS — E1151 Type 2 diabetes mellitus with diabetic peripheral angiopathy without gangrene: Secondary | ICD-10-CM | POA: Diagnosis present

## 2017-01-10 DIAGNOSIS — Z89412 Acquired absence of left great toe: Secondary | ICD-10-CM | POA: Diagnosis not present

## 2017-01-10 DIAGNOSIS — Z992 Dependence on renal dialysis: Secondary | ICD-10-CM | POA: Diagnosis not present

## 2017-01-10 DIAGNOSIS — T8454XD Infection and inflammatory reaction due to internal left knee prosthesis, subsequent encounter: Secondary | ICD-10-CM | POA: Diagnosis not present

## 2017-01-10 DIAGNOSIS — M00862 Arthritis due to other bacteria, left knee: Secondary | ICD-10-CM | POA: Diagnosis not present

## 2017-01-10 DIAGNOSIS — I10 Essential (primary) hypertension: Secondary | ICD-10-CM | POA: Diagnosis not present

## 2017-01-10 DIAGNOSIS — E13621 Other specified diabetes mellitus with foot ulcer: Secondary | ICD-10-CM

## 2017-01-10 DIAGNOSIS — N2581 Secondary hyperparathyroidism of renal origin: Secondary | ICD-10-CM | POA: Diagnosis present

## 2017-01-10 DIAGNOSIS — Z87891 Personal history of nicotine dependence: Secondary | ICD-10-CM | POA: Diagnosis not present

## 2017-01-10 DIAGNOSIS — A4901 Methicillin susceptible Staphylococcus aureus infection, unspecified site: Secondary | ICD-10-CM | POA: Diagnosis not present

## 2017-01-10 DIAGNOSIS — E785 Hyperlipidemia, unspecified: Secondary | ICD-10-CM | POA: Diagnosis present

## 2017-01-10 DIAGNOSIS — Y838 Other surgical procedures as the cause of abnormal reaction of the patient, or of later complication, without mention of misadventure at the time of the procedure: Secondary | ICD-10-CM | POA: Diagnosis present

## 2017-01-10 DIAGNOSIS — L97422 Non-pressure chronic ulcer of left heel and midfoot with fat layer exposed: Secondary | ICD-10-CM | POA: Diagnosis present

## 2017-01-10 DIAGNOSIS — M86672 Other chronic osteomyelitis, left ankle and foot: Secondary | ICD-10-CM | POA: Diagnosis present

## 2017-01-10 DIAGNOSIS — E1143 Type 2 diabetes mellitus with diabetic autonomic (poly)neuropathy: Secondary | ICD-10-CM | POA: Diagnosis present

## 2017-01-10 DIAGNOSIS — B9561 Methicillin susceptible Staphylococcus aureus infection as the cause of diseases classified elsewhere: Secondary | ICD-10-CM | POA: Diagnosis present

## 2017-01-10 DIAGNOSIS — E1122 Type 2 diabetes mellitus with diabetic chronic kidney disease: Secondary | ICD-10-CM | POA: Diagnosis present

## 2017-01-10 LAB — BASIC METABOLIC PANEL
Anion gap: 13 (ref 5–15)
BUN: 48 mg/dL — AB (ref 6–20)
CALCIUM: 8.9 mg/dL (ref 8.9–10.3)
CHLORIDE: 93 mmol/L — AB (ref 101–111)
CO2: 28 mmol/L (ref 22–32)
CREATININE: 6.83 mg/dL — AB (ref 0.61–1.24)
GFR, EST AFRICAN AMERICAN: 9 mL/min — AB (ref >60)
GFR, EST NON AFRICAN AMERICAN: 8 mL/min — AB (ref >60)
Glucose, Bld: 99 mg/dL (ref 65–99)
Potassium: 5 mmol/L (ref 3.5–5.1)
SODIUM: 134 mmol/L — AB (ref 135–145)

## 2017-01-10 LAB — CBC
HCT: 31.9 % — ABNORMAL LOW (ref 39.0–52.0)
HEMOGLOBIN: 9.7 g/dL — AB (ref 13.0–17.0)
MCH: 27.9 pg (ref 26.0–34.0)
MCHC: 30.4 g/dL (ref 30.0–36.0)
MCV: 91.7 fL (ref 78.0–100.0)
PLATELETS: 146 10*3/uL — AB (ref 150–400)
RBC: 3.48 MIL/uL — ABNORMAL LOW (ref 4.22–5.81)
RDW: 17.9 % — AB (ref 11.5–15.5)
WBC: 10 10*3/uL (ref 4.0–10.5)

## 2017-01-10 LAB — GLUCOSE, CAPILLARY: GLUCOSE-CAPILLARY: 150 mg/dL — AB (ref 65–99)

## 2017-01-10 LAB — MRSA PCR SCREENING: MRSA BY PCR: NEGATIVE

## 2017-01-10 MED ORDER — TEMAZEPAM 15 MG PO CAPS
30.0000 mg | ORAL_CAPSULE | Freq: Every evening | ORAL | Status: DC | PRN
  Administered 2017-01-13 – 2017-01-18 (×2): 30 mg via ORAL
  Filled 2017-01-10 (×2): qty 2

## 2017-01-10 MED ORDER — INSULIN ASPART 100 UNIT/ML ~~LOC~~ SOLN
0.0000 [IU] | Freq: Three times a day (TID) | SUBCUTANEOUS | Status: DC
Start: 2017-01-10 — End: 2017-01-19
  Administered 2017-01-14 – 2017-01-16 (×2): 1 [IU] via SUBCUTANEOUS
  Administered 2017-01-16: 2 [IU] via SUBCUTANEOUS
  Administered 2017-01-16 – 2017-01-17 (×3): 1 [IU] via SUBCUTANEOUS
  Administered 2017-01-18: 2 [IU] via SUBCUTANEOUS
  Administered 2017-01-18 – 2017-01-19 (×3): 1 [IU] via SUBCUTANEOUS

## 2017-01-10 NOTE — Progress Notes (Signed)
PROGRESS NOTE    Matthew Kline  MPN:[id number] DOB: 25-May-1960 DOA: 01/09/2017 PCP: Donald Prose, MD   Outpatient Specialists:     Brief Narrative:  Matthew Kline is a 56 y.o. male with medical history significant of ESRD on HD(M/W/F), CHF, CAD s/p CABG 05/2016, PAD, CVA, anemia, and chronic ulcer with h/o osteomyelitis of the left foot; who presents after having a fall at home a few days ago after his left knee gave way.  He reports falling onto his side with no loss of consciousness or trauma to his head.  However, thereafter patient noted that he was unable to ambulate due to severe pain in the left foot and knee.  He was taking Tylenol at home without much relief.  Due to his symptoms he missed dialysis today.  The ulcer of his left foot is noted to chronically drain fluid and he is followed by the wound care clinic for this.  Dr. Dellia Nims of the wound care clinic had obtained a culture of the fluid 3 days ago.      Assessment & Plan:   Principal Problem:   Fall Active Problems:   Anemia of chronic disease   Leukocytosis   Essential hypertension, benign   Dehiscence of closure of skin   End-stage renal disease on hemodialysis (HCC)   CAD, multiple vessel   Peripheral neuropathy   Ulcer of left midfoot with fat layer exposed ([place])   Gait disturbance   Staphylococcus aureus infection   Effusion of left knee   Fall initial encounter, gait disturbance: Acute.   -PT Eval after surgery  Left knee effusion due to acute quadricepts tendon rupture -plan for surgery on Wednesday by Dr. Sharol Given   Diabetic foot ulcer, positive Staphylococcus aureus: Patient followed by wound care center in the outpatient setting by Dr. Dellia Nims.   -seen by wound care continue same regimen -outpatient wound care follow up -hold on abx for now  Leukocytosis:  -trend  ESRD on HD:  -plan for HD today  Essential hypertension - Continue Coreg and amlodipine   Anemia of chronic disease:    -per renal  CAD, PAD: Patient status post CABG in 05/2016 and has had previous endovascular intervention for peripheral artery disease. - Continue Plavix and aspirin  Hyperlipidemia - Continue atorvastatin  Diabetes mellitus type 2: Patient currently controlled with diet.  Last hemoglobin A1c 6.4 on 11/08/2016. - SSI  Peripheral neuropathy - Continue gabapentin     DVT prophylaxis:  SQ Heparin  Code Status: Full Code   Family Communication:   Disposition Plan:     Consultants:   Renal  ortho     Subjective: Having trouble opening juice this AM  Objective: Vitals:   01/10/17 1000 01/10/17 1100 01/10/17 1130 01/10/17 1230  BP: 129/76 106/60 (!) 101/59 96/83  Pulse: 84 80 81 (!) 124  Resp: 15 17 (!) 29 11  Temp:      TempSrc:      SpO2: 95% 95% 100% 93%   No intake or output data in the 24 hours ending 01/10/17 1342 There were no vitals filed for this visit.  Examination:  General exam: chronically ill appearing Respiratory system: rales at base Cardiovascular system: rrr Gastrointestinal system: +Bs, soft Central nervous system: Alert-- tremors Extremities: left leg in brace Skin: areas of bruising     Data Reviewed: I have personally reviewed following labs and imaging studies  CBC: Recent Labs  Lab 01/09/17 1243 01/10/17 0502  WBC 10.9* 10.0  NEUTROABS 9.8*  --  HGB 10.3* 9.7*  HCT 32.7* 31.9*  MCV 90.6 91.7  PLT 162 242*   Basic Metabolic Panel: Recent Labs  Lab 01/09/17 1243 01/10/17 0502  NA 133* 134*  K 4.1 5.0  CL 93* 93*  CO2 27 28  GLUCOSE 124* 99  BUN 40* 48*  CREATININE 5.99* 6.83*  CALCIUM 8.9 8.9   GFR: CrCl cannot be calculated (Unknown ideal weight.). Liver Function Tests: No results for input(s): AST, ALT, ALKPHOS, BILITOT, PROT, ALBUMIN in the last 168 hours. No results for input(s): LIPASE, AMYLASE in the last 168 hours. No results for input(s): AMMONIA in the last 168 hours. Coagulation  Profile: No results for input(s): INR, PROTIME in the last 168 hours. Cardiac Enzymes: No results for input(s): CKTOTAL, CKMB, CKMBINDEX, TROPONINI in the last 168 hours. BNP (last 3 results) No results for input(s): PROBNP in the last 8760 hours. HbA1C: No results for input(s): HGBA1C in the last 72 hours. CBG: Recent Labs  Lab 01/09/17 2239  GLUCAP 111*   Lipid Profile: No results for input(s): CHOL, HDL, LDLCALC, TRIG, CHOLHDL, LDLDIRECT in the last 72 hours. Thyroid Function Tests: No results for input(s): TSH, T4TOTAL, FREET4, T3FREE, THYROIDAB in the last 72 hours. Anemia Panel: No results for input(s): VITAMINB12, FOLATE, FERRITIN, TIBC, IRON, RETICCTPCT in the last 72 hours. Urine analysis:    Component Value Date/Time   COLORURINE YELLOW 01/03/2012 1335   APPEARANCEUR Clear 07/12/2013 0918   LABSPEC 1.021 01/03/2012 1335   PHURINE 6.0 01/03/2012 1335   GLUCOSEU Trace (A) 07/12/2013 0918   HGBUR TRACE (A) 01/03/2012 1335   BILIRUBINUR Negative 07/12/2013 [place] 01/03/2012 1335   PROTEINUR 3+ (A) 07/12/2013 0918   PROTEINUR >300 (A) 01/03/2012 1335   UROBILINOGEN 0.2 01/03/2012 1335   NITRITE Negative 07/12/2013 0918   NITRITE NEGATIVE 01/03/2012 1335   LEUKOCYTESUR Negative 07/12/2013 0918     ) Recent Results (from the past 240 hour(s))  Aerobic Culture (superficial specimen)     Status: None   Collection Time: 01/06/17 12:32 PM  Result Value Ref Range Status   Specimen Description FOOT LEFT PLANTAR  Final   Special Requests NONE  Final   Gram Stain   Final    NO WBC SEEN ABUNDANT GRAM POSITIVE COCCI IN PAIRS IN CLUSTERS ABUNDANT GRAM NEGATIVE RODS FEW GRAM POSITIVE RODS    Culture   Final    MODERATE STAPHYLOCOCCUS AUREUS WITHIN MIXED ORGANISMS Performed at [facility], [place] [address] [place], [place] [address]    Report Status 01/09/2017 FINAL  Final   Organism ID, Bacteria STAPHYLOCOCCUS AUREUS  Final       Susceptibility   Staphylococcus aureus - MIC*    CIPROFLOXACIN <=0.5 SENSITIVE Sensitive     ERYTHROMYCIN >=8 RESISTANT Resistant     GENTAMICIN <=0.5 SENSITIVE Sensitive     OXACILLIN <=0.25 SENSITIVE Sensitive     TETRACYCLINE <=1 SENSITIVE Sensitive     VANCOMYCIN <=0.5 SENSITIVE Sensitive     TRIMETH/SULFA <=10 SENSITIVE Sensitive     CLINDAMYCIN <=0.25 SENSITIVE Sensitive     RIFAMPIN <=0.5 SENSITIVE Sensitive     Inducible Clindamycin NEGATIVE Sensitive     * MODERATE STAPHYLOCOCCUS AUREUS      Anti-infectives (From admission, onward)   Start     Dose/Rate Route Frequency Ordered Stop   01/09/17 2200  clindamycin (CLEOCIN) capsule 300 mg  Status:  Discontinued     300 mg Oral Every 8 hours 01/09/17 2019 01/10/17 1002  Radiology Studies: Dg Ankle Complete Left  Result Date: 01/09/2017 CLINICAL DATA:  Patient reports recent fall, patient reports already having swelling of his entire left leg, reports he has noticed more swelling, reports severe pain to left side, reports not being able to put pressure on left knee, general lef.*comment was truncated* EXAM: LEFT ANKLE COMPLETE - 3+ VIEW COMPARISON:  Foot films dictated separately.  Prior of 11/09/2016. FINDINGS: Diffuse soft tissue swelling about the ankle. Degenerate changes about the hindfoot. No acute fracture or dislocation. IMPRESSION: Soft tissue swelling with degenerative changes.  No acute findings. Electronically Signed   By: Abigail Miyamoto M.D.   On: 01/09/2017 13:40   Dg Knee Complete 4 Views Left  Result Date: 01/09/2017 CLINICAL DATA:  Recent fall with left knee pain EXAM: LEFT KNEE - COMPLETE 4+ VIEW COMPARISON:  08/07/2014 FINDINGS: Large joint effusion is noted with some areas of lucency within somewhat suspicious for septic joint. Displacement of the patella is noted by the effusion. No definitive acute fracture is seen. Diffuse soft tissue swelling is noted below the knee joint. IMPRESSION: Large joint  effusion with some areas of lucency suspicious for a septic joint. Clinical correlation is recommended. Arthrocentesis may be helpful. No definitive acute fracture is seen. Electronically Signed   By: Inez Catalina M.D.   On: 01/09/2017 13:40   Dg Foot Complete Left  Result Date: 01/09/2017 CLINICAL DATA:  Recent fall.  Increased swelling. EXAM: LEFT FOOT - COMPLETE 3+ VIEW COMPARISON:  11/09/2016 FINDINGS: Re- demonstration of prior first phalanx and fourth transmetatarsal amputations. Vascular calcifications. Degenerate changes of the midfoot. No acute fracture or dislocation. No gross osseous destruction. No soft tissue gas or well-defined soft tissue ulcer. Diffuse soft tissue swelling, increased since the prior exam. No radiopaque foreign object. IMPRESSION: Postsurgical and degenerative changes, with progressive soft tissue swelling. No definite acute superimposed osseous abnormality. Electronically Signed   By: Abigail Miyamoto M.D.   On: 01/09/2017 13:39        Scheduled Meds: . amLODipine  5 mg Oral QHS  . aspirin EC  162 mg Oral Daily  . atorvastatin  40 mg Oral q1800  . carvedilol  25 mg Oral BID WC  . clopidogrel  75 mg Oral Daily  . gabapentin  300 mg Oral TID  . heparin  5,000 Units Subcutaneous Q8H  . lidocaine (PF)  5 mL Intradermal Once  . montelukast  10 mg Oral QHS  . sevelamer carbonate  2,400-3,200 mg Oral TID WC  . temazepam  30 mg Oral QHS   Continuous Infusions:   LOS: 0 days    Time spent: 25 min    Geradine Girt, DO [facility] Pager [phone]  If 7PM-7AM, please contact night-coverage [email or web address] Password [email or web address] 01/10/2017, 1:42 PM

## 2017-01-10 NOTE — Consult Note (Signed)
[facility] Renal Consultation Note    Indication for Consultation:  Management of ESRD/hemodialysis, anemia, hypertension/volume, and secondary hyperparathyroidism. PCP:  HPI: Matthew Kline is a 56 y.o. male with ESRD, HTN, CAD (Hx CABG 2018), Type 2 DM, PAD, Hx CVA, and chronic L foot wound who was admitted with L knee injury and inability to ambulate.  Pt reports twisting fall with L knee injury on 12/29. He is unclear exactly how he fell. Hx prior L knee surgeries including L TKA. Significant pain after fall and weakness of the L leg. He missed his dialysis yesterday (holiday schedule) due to the knee pain. Otherwise, he denies any systemic symptoms. No fever, chills, CP, dyspnea, N/V or diarrhea. Follows with wound clinic for ongoing L foot ulceration, says healing ok.  In ED, L knee x-ray was without fracture, but large effusion present. Labs with K 5, WBC 10.9, Hgb 10.3Ortho consulted and felt that he sustained an acute L quad tendon rupture. Plan is for surgical repair on 01/13/16.  Dialyzes MWF at [facility], last HD was 12/28 which he completed in entirety and met his dry weight. Missed HD yesterday (12/30-holiday schedule). Uses AVF without recent issues.  Past Medical History:  Diagnosis Date  . Allergy   . Anemia, unspecified   . Anxiety   . Arthralgia 2010   polyarticular  . Arthritis    "back, knees" (05/09/2014)  . CHF (congestive heart failure) ([place])   . CHF (congestive heart failure) ([employer]) 07/25/2009   denies  . Chronic lower back pain   . Coronary artery disease   . Coughing    pt. reports that he has drainage from sinus infection  . Diabetic neuropathy ([place])   . ESRD (end stage renal disease) on dialysis [facility])    started 12/2012; "MWF; [facility] "   . GERD (gastroesophageal reflux disease)    hx "before I lost weight", no problem 9 years  . Hemodialysis access site with mature fistula ([place])   . Hemorrhoids, internal 10/2011   small   . History of blood transfusion    "related to the anemia"  . Hypertension   . Insomnia, unspecified   . Knee pain, left   . 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 Long term (current) use of anticoagulants   . Myocardial infarction ([place]) 1995  . Orthostatic hypotension   . Osteomyelitis of foot, left, acute ([place])   . Other chronic postoperative pain   . Pneumonia    "probably 4-5 times" (05/09/2014)  . Polymyalgia rheumatica ([place])   . Renal insufficiency   . Sleep apnea    "lost weight; no more problem" (05/09/2014)  . Stroke ([place]) 01/10/06   denies residual on 05/09/2014  . Type II diabetes mellitus ([place]) dx'd 1995  . Ulcer    diabetic foot   . Unspecified hereditary and idiopathic peripheral neuropathy    feet  . Unspecified osteomyelitis, site unspecified   . Unspecified vitamin D deficiency    Past Surgical History:  Procedure Laterality Date  . ABDOMINAL AORTOGRAM N/A 08/25/2016   Procedure: ABDOMINAL AORTOGRAM;  Surgeon: Wellington Hampshire, MD;  Location: [place] [facility];  Service: Cardiovascular;  Laterality: N/A;  . AMPUTATION  01/21/2012   Procedure: AMPUTATION RAY;  Surgeon: [name], MD;  Location: [place];  Service: Orthopedics;  Laterality: Left;  Left Foot 4th Ray Amputation  . AMPUTATION Left 05/04/2013   Procedure: AMPUTATION DIGIT;  Surgeon: [name], MD;  Location: [place];  Service: Orthopedics;  Laterality: Left;  Left Great Toe Amputation at [facility]  . ANTERIOR CERVICAL DECOMP/DISCECTOMY FUSION  02/2011  . BASCILIC VEIN TRANSPOSITION Left 10/19/2012   Procedure: BASCILIC VEIN TRANSPOSITION;  Surgeon: Serafina Mitchell, MD;  Location: [place];  Service: Vascular;  Laterality: Left;  . CORONARY ARTERY BYPASS GRAFT     x 5 with lima at [place] WITH ANTIBIOTIC SPACERS Left 08/07/2014   Procedure: Replace Left Total Knee Arthroplasty,  Place Antibiotic Spacer;  Surgeon: [name], MD;  Location: [place];  Service:  Orthopedics;  Laterality: Left;  . I&D EXTREMITY Left 05/09/2014   Procedure: Irrigation and Debridement Left Knee and Closure of Total Knee Arthroplasty Incision;  Surgeon: [name], MD;  Location: [place];  Service: Orthopedics;  Laterality: Left;  . I&D KNEE WITH POLY EXCHANGE Left 05/31/2014   Procedure: IRRIGATION AND DEBRIDEMENT LEFT KNEE, PLACE ANTIBIOTIC BEADS,  POLY EXCHANGE;  Surgeon: [name], MD;  Location: [place];  Service: Orthopedics;  Laterality: Left;  . KNEE ARTHROSCOPY Left 08-25-2012  . LOWER EXTREMITY ANGIOGRAPHY Left 08/25/2016   Procedure: Lower Extremity Angiography;  Surgeon: Wellington Hampshire, MD;  Location: [place] [facility];  Service: Cardiovascular;  Laterality: Left;  . PERIPHERAL VASCULAR BALLOON ANGIOPLASTY Left 08/25/2016   Procedure: PERIPHERAL VASCULAR BALLOON ANGIOPLASTY;  Surgeon: Wellington Hampshire, MD;  Location: [place] [facility];  Service: Cardiovascular;  Laterality: Left;  lt peroneal and ant tibial arteries cutting balloon  . REFRACTIVE SURGERY Bilateral   . TOE AMPUTATION Bilateral    "I've lost 7 toes over the last 7 years" (05/09/2014)  . TOE SURGERY Left April 2015   Big toe removed on left foot.  . TONSILLECTOMY    . TOTAL KNEE ARTHROPLASTY Left 04/10/2014   Procedure: TOTAL KNEE ARTHROPLASTY;  Surgeon: [name], MD;  Location: [place];  Service: Orthopedics;  Laterality: Left;  . TOTAL KNEE REVISION Left 10/25/2014   Procedure: LEFT TOTAL KNEE REVISION;  Surgeon: [name], MD;  Location: [place];  Service: Orthopedics;  Laterality: Left;  . TOTAL KNEE REVISION Left 11/26/2015   Procedure: Removal Left Total Knee Arthroplasty, Hinged Total Knee Arthroplasty;  Surgeon: [name], MD;  Location: [place];  Service: Orthopedics;  Laterality: Left;  . UVULOPALATOPHARYNGOPLASTY, TONSILLECTOMY AND SEPTOPLASTY  ~ 1989  . WOUND DEBRIDEMENT Left 05/09/2014   Dehiscence Left Total Knee Arthroplasty Incision   Family History  Problem Relation  Age of Onset  . Hypertension Mother   . Cancer Mother 84       Ovarian  . Heart disease Maternal Aunt   . Stroke Maternal Grandfather    Social History:  reports that he quit smoking about 7 months ago. His smoking use included cigarettes. He has a 3.84 pack-year smoking history. He quit smokeless tobacco use about 2 years ago. He reports that he does not drink alcohol or use drugs.  ROS: As per HPI otherwise negative.  Physical Exam: Vitals:   01/10/17 0622 01/10/17 0630 01/10/17 0700 01/10/17 0730  BP:  (!) 97/52 110/66 119/77  Pulse: 85 (!) 51 85 85  Resp: 15 (!) _0 Temp:      TempSrc:      SpO2: 96% 96% (!) 88% 98%     General: Well developed, well nourished, in no acute distress. Head: Normocephalic, atraumatic, sclera non-icteric, mucus membranes are moist. Neck: Supple without lymphadenopathy/masses.  JVD not elevated. Lungs: Clear bilaterally in upper lobes, bibasilar rales present. Heart: RRR, but distant heart sounds Abdomen: Soft, non-tender, non-distended with normoactive bowel sounds. No rebound/guarding. No obvious abdominal masses. Lower extremities: 2+ B pitting LE edema, bandaged L foot. L knee very swollen and warm, no erythema.  Neuro: Alert and oriented X 3. Moves all extremities spontaneously. Psych:  Responds to questions appropriately with a normal affect. Sarcastic tone. Dialysis Access: LUE AVF + thrill  Allergies  Allergen Reactions  . Morphine And Related Other (See Comments)    hallucinations  . Tygacil [Tigecycline] Nausea And Vomiting and Other (See Comments)    Makes him feel crazy   Prior to Admission medications   Medication Sig Start Date End Date Taking? Authorizing Provider  amLODipine (NORVASC) 5 MG tablet Take 5 mg by mouth daily at 6 PM.    Yes [provider]  aspirin 81 MG EC tablet TAKE 1 TABLET EVERY DAY Patient taking differently: TAKE 1 TABLET (81 MG) BY MOUTH EVERY DAY 08/05/15  Yes Eulas Post, Monica, DO   atorvastatin (LIPITOR) 40 MG tablet Take 40 mg by mouth daily at 6 PM.   Yes [provider]  carvedilol (COREG) 25 MG tablet Take 25 mg by mouth daily at 6 PM.    Yes [provider]  clopidogrel (PLAVIX) 75 MG tablet Take 1 tablet (75 mg total) by mouth daily. Patient taking differently: Take 75 mg by mouth daily at 6 PM.  08/25/16 08/25/17 Yes Arida, Mertie Clause, MD  gabapentin (NEURONTIN) 300 MG capsule TAKE ONE CAPSULE BY MOUTH 3 TIMES A DAY Patient taking differently: TAKE ONE CAPSULE (300 MG) BY MOUTH 3 TIMES DAILY 02/20/16  Yes Dondra Prader R, NP  montelukast (SINGULAIR) 10 MG tablet TAKE 1 TABLET (10 MG TOTAL) BY MOUTH AT BEDTIME. 09/01/15  Yes Gildardo Cranker, DO  sevelamer carbonate (RENVELA) 800 MG tablet Take 1,600-2,400 mg by mouth 3 (three) times daily with meals. Take 2400 to 3200 mg with each me (based on the size of the meal) 3 times daily and take 800 mg with large snacks   Yes [provider]  temazepam (RESTORIL) 30 MG capsule Take 30 mg by mouth at bedtime.  11/10/15  Yes [provider]  oxyCODONE-acetaminophen (PERCOCET) 5-325 MG tablet Take 1 tablet by mouth every 4 (four) hours as needed. 01/09/17   Pisciotta, Elmyra Ricks, PA-C  oxyCODONE-acetaminophen (PERCOCET) 5-325 MG tablet Take 1 tablet by mouth every 4 (four) hours as needed. 01/09/17   Pisciotta, Elmyra Ricks, PA-C   Current Facility-Administered Medications  Medication Dose Route Frequency Provider Last Rate Last Dose  . acetaminophen (TYLENOL) tablet 650 mg  650 mg Oral Q6H PRN Norval Morton, MD       Or  . acetaminophen (TYLENOL) suppository 650 mg  650 mg Rectal Q6H PRN Smith, Rondell A, MD      . albuterol (PROVENTIL) (2.5 MG/3ML) 0.083% nebulizer solution 2.5 mg  2.5 mg Nebulization Q6H PRN Smith, Rondell A, MD      . amLODipine (NORVASC) tablet 5 mg  5 mg Oral QHS Smith, Rondell A, MD      . aspirin EC tablet 162 mg  162 mg Oral Daily Tamala Julian, Rondell A, MD   162 mg at 01/10/17 1158  .  atorvastatin (LIPITOR) tablet 40 mg  40 mg Oral q1800 Smith, Rondell A, MD      . carvedilol (COREG) tablet 25 mg  25 mg Oral BID WC Norval Morton, MD  25 mg at 01/10/17 0809  . clopidogrel (PLAVIX) tablet 75 mg  75 mg Oral Daily Tamala Julian, Rondell A, MD   75 mg at 01/10/17 1158  . gabapentin (NEURONTIN) capsule 300 mg  300 mg Oral TID Fuller Plan A, MD   300 mg at 01/10/17 1201  . heparin injection 5,000 Units  5,000 Units Subcutaneous Q8H Fuller Plan A, MD   5,000 Units at 01/10/17 0559  . lidocaine (PF) (XYLOCAINE) 1 % injection 5 mL  5 mL Intradermal Once Pisciotta, Elmyra Ricks, PA-C   Stopped at 01/09/17 2235  . montelukast (SINGULAIR) tablet 10 mg  10 mg Oral QHS Fuller Plan A, MD   10 mg at 01/09/17 2303  . ondansetron (ZOFRAN) tablet 4 mg  4 mg Oral Q6H PRN Fuller Plan A, MD       Or  . ondansetron (ZOFRAN) injection 4 mg  4 mg Intravenous Q6H PRN Fuller Plan A, MD      . oxyCODONE-acetaminophen (PERCOCET/ROXICET) 5-325 MG per tablet 1-2 tablet  1-2 tablet Oral Q6H PRN Fuller Plan A, MD      . sevelamer carbonate (RENVELA) tablet 2,400-3,200 mg  2,400-3,200 mg Oral TID WC Smith, Rondell A, MD   2,400 mg at 01/10/17 0809  . temazepam (RESTORIL) capsule 30 mg  30 mg Oral QHS Norval Morton, MD       Current Outpatient Medications  Medication Sig Dispense Refill  . amLODipine (NORVASC) 5 MG tablet Take 5 mg by mouth daily at 6 PM.     . aspirin 81 MG EC tablet TAKE 1 TABLET EVERY DAY (Patient taking differently: TAKE 1 TABLET (81 MG) BY MOUTH EVERY DAY) 30 tablet 6  . atorvastatin (LIPITOR) 40 MG tablet Take 40 mg by mouth daily at 6 PM.    . carvedilol (COREG) 25 MG tablet Take 25 mg by mouth daily at 6 PM.     . clopidogrel (PLAVIX) 75 MG tablet Take 1 tablet (75 mg total) by mouth daily. (Patient taking differently: Take 75 mg by mouth daily at 6 PM. ) 30 tablet 6  . gabapentin (NEURONTIN) 300 MG capsule TAKE ONE CAPSULE BY MOUTH 3 TIMES A DAY (Patient taking differently:  TAKE ONE CAPSULE (300 MG) BY MOUTH 3 TIMES DAILY) 270 capsule 0  . montelukast (SINGULAIR) 10 MG tablet TAKE 1 TABLET (10 MG TOTAL) BY MOUTH AT BEDTIME. 90 tablet 1  . sevelamer carbonate (RENVELA) 800 MG tablet Take 1,600-2,400 mg by mouth 3 (three) times daily with meals. Take 2400 to 3200 mg with each me (based on the size of the meal) 3 times daily and take 800 mg with large snacks    . temazepam (RESTORIL) 30 MG capsule Take 30 mg by mouth at bedtime.   2  . oxyCODONE-acetaminophen (PERCOCET) 5-325 MG tablet Take 1 tablet by mouth every 4 (four) hours as needed. 13 tablet 0  . oxyCODONE-acetaminophen (PERCOCET) 5-325 MG tablet Take 1 tablet by mouth every 4 (four) hours as needed. 13 tablet 0   Labs: Basic Metabolic Panel: Recent Labs  Lab 01/09/17 1243 01/10/17 0502  NA 133* 134*  K 4.1 5.0  CL 93* 93*  CO2 27 28  GLUCOSE 124* 99  BUN 40* 48*  CREATININE 5.99* 6.83*  CALCIUM 8.9 8.9   CBC: Recent Labs  Lab 01/09/17 1243 01/10/17 0502  WBC 10.9* 10.0  NEUTROABS 9.8*  --   HGB 10.3* 9.7*  HCT 32.7* 31.9*  MCV 90.6 91.7  PLT 162 146*  Studies/Results: Dg Ankle Complete Left  Result Date: 01/09/2017 CLINICAL DATA:  Patient reports recent fall, patient reports already having swelling of his entire left leg, reports he has noticed more swelling, reports severe pain to left side, reports not being able to put pressure on left knee, general lef.*comment was truncated* EXAM: LEFT ANKLE COMPLETE - 3+ VIEW COMPARISON:  Foot films dictated separately.  Prior of 11/09/2016. FINDINGS: Diffuse soft tissue swelling about the ankle. Degenerate changes about the hindfoot. No acute fracture or dislocation. IMPRESSION: Soft tissue swelling with degenerative changes.  No acute findings. Electronically Signed   By: Abigail Miyamoto M.D.   On: 01/09/2017 13:40   Dg Knee Complete 4 Views Left  Result Date: 01/09/2017 CLINICAL DATA:  Recent fall with left knee pain EXAM: LEFT KNEE - COMPLETE 4+  VIEW COMPARISON:  08/07/2014 FINDINGS: Large joint effusion is noted with some areas of lucency within somewhat suspicious for septic joint. Displacement of the patella is noted by the effusion. No definitive acute fracture is seen. Diffuse soft tissue swelling is noted below the knee joint. IMPRESSION: Large joint effusion with some areas of lucency suspicious for a septic joint. Clinical correlation is recommended. Arthrocentesis may be helpful. No definitive acute fracture is seen. Electronically Signed   By: Inez Catalina M.D.   On: 01/09/2017 13:40   Dg Foot Complete Left  Result Date: 01/09/2017 CLINICAL DATA:  Recent fall.  Increased swelling. EXAM: LEFT FOOT - COMPLETE 3+ VIEW COMPARISON:  11/09/2016 FINDINGS: Re- demonstration of prior first phalanx and fourth transmetatarsal amputations. Vascular calcifications. Degenerate changes of the midfoot. No acute fracture or dislocation. No gross osseous destruction. No soft tissue gas or well-defined soft tissue ulcer. Diffuse soft tissue swelling, increased since the prior exam. No radiopaque foreign object. IMPRESSION: Postsurgical and degenerative changes, with progressive soft tissue swelling. No definite acute superimposed osseous abnormality. Electronically Signed   By: Abigail Miyamoto M.D.   On: 01/09/2017 13:39    Dialysis Orders:  MWF at [facility] 4hr, BFR 400, DFR 800, EDW 84.5kg, 2K/2Ca, AVF, heparin 4000 bolus - Mircera 269mg IV q 2 weeks (last given 12/26) - Hectoral 374m (just ^'d from 51m39m IV q HD  Assessment/Plan: 1.  L knee quad tendon rupture: S/p fall. Per ortho, for surgical repair on 01/13/16. 2.  ESRD: Will continue MWF schedule, will dialyze today. 3.  Hypertension/volume: BP controlled, quite a bit of edema on exam, will try 4L UF. 4.  Anemia: Hgb 9.7. Not due for ESA yet. 5.  Metabolic bone disease: Ca ok. Continue binders (auLorin Picketnd VDRA. 6.  CAD 7.  Type 2 DM 8.  PAD (chronic L foot wound): Per primary.   KatVeneta PentonA-C 01/10/2017, 12:04 PM  [facility] Pager: [phone] have seen and examined this patient and agree with plan and assessment in the above note with renal recommendations/intervention highlighted.  Pt skipped HD yesterday so will plan for HD later tonight.   JosGovernor Rooksladonato,MD 01/10/2017 4:44 PM

## 2017-01-10 NOTE — Consult Note (Addendum)
[facility] Nurse wound consult note Reason for Consult: Consult requested for left foot wound.  Pt states dressing was already changed today.  Reviewed photos in the EMR; wound apperas to be 60% red, 40% yellow, mod amt tan drainage. Ortho has performed a consult and plans to follow for quad tendon surgery, according to progress notes. Wound type: Chronic full thickness wound to left plantar foot.  Pt states he is followed by the outpatient wound care center and he is using alginate dressings Q day with ABD pad and coban. Dressing procedure/placement/frequency: Bedside nurses can change dressing Q day; continue present plan of care with Aquacel, ABD pad, and coban.  Pt can follow-up with the outpatient wound care center after discharge.  Discussed plan of care and he verbalized understanding. Please re-consult if further assistance is needed.  Thank-you,  Julien Girt MSN, [place], [place], [place], [place]

## 2017-01-10 NOTE — Consult Note (Signed)
ORTHOPAEDIC CONSULTATION  REQUESTING PHYSICIAN: Geradine Girt, DO  Chief Complaint: Left knee pain and weakness status post fall.  HPI: Matthew Kline is a 56 y.o. male who presents with left knee pain weakness and swelling.  Patient states that he had an awkward fall he states he cannot describe it and since that time patient has had pain and weakness and inability to ambulate on the left lower extremity.  Past Medical History:  Diagnosis Date  . Allergy   . Anemia, unspecified   . Anxiety   . Arthralgia 2010   polyarticular  . Arthritis    "back, knees" (05/09/2014)  . CHF (congestive heart failure) ([place])   . CHF (congestive heart failure) ([place]) 07/25/2009   denies  . Chronic lower back pain   . Coronary artery disease   . Coughing    pt. reports that he has drainage from sinus infection  . Diabetic neuropathy ([place])   . ESRD (end stage renal disease) on dialysis [facility])    started 12/2012; "MWF; [facility] "   . GERD (gastroesophageal reflux disease)    hx "before I lost weight", no problem 9 years  . Hemodialysis access site with mature fistula ([place])   . Hemorrhoids, internal 10/2011   small  . History of blood transfusion    "related to the anemia"  . Hypertension   . Insomnia, unspecified   . Knee pain, left   . 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 Long term (current) use of anticoagulants   . Myocardial infarction ([place]) 1995  . Orthostatic hypotension   . Osteomyelitis of foot, left, acute ([place])   . Other chronic postoperative pain   . Pneumonia    "probably 4-5 times" (05/09/2014)  . Polymyalgia rheumatica ([place])   . Renal insufficiency   . Sleep apnea    "lost weight; no more problem" (05/09/2014)  . Stroke ([place]) 01/10/06   denies residual on 05/09/2014  . Type II diabetes mellitus ([place]) dx'd 1995  . Ulcer    diabetic foot   . Unspecified hereditary and idiopathic peripheral neuropathy    feet  . Unspecified osteomyelitis, site  unspecified   . Unspecified vitamin D deficiency    Past Surgical History:  Procedure Laterality Date  . ABDOMINAL AORTOGRAM N/A 08/25/2016   Procedure: ABDOMINAL AORTOGRAM;  Surgeon: Wellington Hampshire, MD;  Location: [place] [facility];  Service: Cardiovascular;  Laterality: N/A;  . AMPUTATION  01/21/2012   Procedure: AMPUTATION RAY;  Surgeon: [name], MD;  Location: [place];  Service: Orthopedics;  Laterality: Left;  Left Foot 4th Ray Amputation  . AMPUTATION Left 05/04/2013   Procedure: AMPUTATION DIGIT;  Surgeon: [name], MD;  Location: [place];  Service: Orthopedics;  Laterality: Left;  Left Great Toe Amputation at [facility]  . ANTERIOR CERVICAL DECOMP/DISCECTOMY FUSION  02/2011  . BASCILIC VEIN TRANSPOSITION Left 10/19/2012   Procedure: BASCILIC VEIN TRANSPOSITION;  Surgeon: Serafina Mitchell, MD;  Location: [place];  Service: Vascular;  Laterality: Left;  . CORONARY ARTERY BYPASS GRAFT     x 5 with lima at [place] WITH ANTIBIOTIC SPACERS Left 08/07/2014   Procedure: Replace Left Total Knee Arthroplasty,  Place Antibiotic Spacer;  Surgeon: [name], MD;  Location: [place];  Service: Orthopedics;  Laterality: Left;  . I&D EXTREMITY Left 05/09/2014   Procedure: Irrigation and Debridement Left Knee and Closure of Total Knee Arthroplasty Incision;  Surgeon: [name], MD;  Location: [place];  Service: Orthopedics;  Laterality: Left;  . I&D KNEE WITH POLY EXCHANGE Left 05/31/2014   Procedure: IRRIGATION AND DEBRIDEMENT LEFT KNEE, PLACE ANTIBIOTIC BEADS,  POLY EXCHANGE;  Surgeon: [name], MD;  Location: [place];  Service: Orthopedics;  Laterality: Left;  . KNEE ARTHROSCOPY Left 08-25-2012  . LOWER EXTREMITY ANGIOGRAPHY Left 08/25/2016   Procedure: Lower Extremity Angiography;  Surgeon: Wellington Hampshire, MD;  Location: [place] [facility];  Service: Cardiovascular;  Laterality: Left;  . PERIPHERAL VASCULAR BALLOON ANGIOPLASTY Left 08/25/2016    Procedure: PERIPHERAL VASCULAR BALLOON ANGIOPLASTY;  Surgeon: Wellington Hampshire, MD;  Location: [place] [facility];  Service: Cardiovascular;  Laterality: Left;  lt peroneal and ant tibial arteries cutting balloon  . REFRACTIVE SURGERY Bilateral   . TOE AMPUTATION Bilateral    "I've lost 7 toes over the last 7 years" (05/09/2014)  . TOE SURGERY Left April 2015   Big toe removed on left foot.  . TONSILLECTOMY    . TOTAL KNEE ARTHROPLASTY Left 04/10/2014   Procedure: TOTAL KNEE ARTHROPLASTY;  Surgeon: [name], MD;  Location: [place];  Service: Orthopedics;  Laterality: Left;  . TOTAL KNEE REVISION Left 10/25/2014   Procedure: LEFT TOTAL KNEE REVISION;  Surgeon: [name], MD;  Location: [place];  Service: Orthopedics;  Laterality: Left;  . TOTAL KNEE REVISION Left 11/26/2015   Procedure: Removal Left Total Knee Arthroplasty, Hinged Total Knee Arthroplasty;  Surgeon: [name], MD;  Location: [place];  Service: Orthopedics;  Laterality: Left;  . UVULOPALATOPHARYNGOPLASTY, TONSILLECTOMY AND SEPTOPLASTY  ~ 1989  . WOUND DEBRIDEMENT Left 05/09/2014   Dehiscence Left Total Knee Arthroplasty Incision   Social History   Socioeconomic History  . Marital status: Married    Spouse name: None  . Number of children: None  . Years of education: None  . Highest education level: None  Social Needs  . Financial resource strain: None  . Food insecurity - worry: None  . Food insecurity - inability: None  . Transportation needs - medical: None  . Transportation needs - non-medical: None  Occupational History  . None  Tobacco Use  . Smoking status: Former Smoker    Packs/day: 0.12    Years: 32.00    Pack years: 3.84    Types: Cigarettes    Last attempt to quit: 05/28/2016    Years since quitting: 0.6  . Smokeless tobacco: Former Systems developer    Quit date: 10/06/2014  . Tobacco comment: 3 -4 cig daily   Substance and Sexual Activity  . Alcohol use: No    Alcohol/week: 0.0 oz  . Drug use: No  .  Sexual activity: Yes  Other Topics Concern  . None  Social History Narrative  . None   Family History  Problem Relation Age of Onset  . Hypertension Mother   . Cancer Mother 24       Ovarian  . Heart disease Maternal Aunt   . Stroke Maternal Grandfather    - negative except otherwise stated in the family history section Allergies  Allergen Reactions  . Morphine And Related Other (See Comments)    hallucinations  . Tygacil [Tigecycline] Nausea And Vomiting and Other (See Comments)    Makes him feel crazy   Prior to Admission medications   Medication Sig Start Date End Date Taking? Authorizing Provider  amLODipine (NORVASC) 5 MG tablet Take 5 mg by mouth daily at 6 PM.  Yes [provider]  aspirin 81 MG EC tablet TAKE 1 TABLET EVERY DAY Patient taking differently: TAKE 1 TABLET (81 MG) BY MOUTH EVERY DAY 08/05/15  Yes Eulas Post, Monica, DO  atorvastatin (LIPITOR) 40 MG tablet Take 40 mg by mouth daily at 6 PM.   Yes [provider]  carvedilol (COREG) 25 MG tablet Take 25 mg by mouth daily at 6 PM.    Yes [provider]  clopidogrel (PLAVIX) 75 MG tablet Take 1 tablet (75 mg total) by mouth daily. Patient taking differently: Take 75 mg by mouth daily at 6 PM.  08/25/16 08/25/17 Yes Arida, Mertie Clause, MD  gabapentin (NEURONTIN) 300 MG capsule TAKE ONE CAPSULE BY MOUTH 3 TIMES A DAY Patient taking differently: TAKE ONE CAPSULE (300 MG) BY MOUTH 3 TIMES DAILY 02/20/16  Yes Dondra Prader R, NP  montelukast (SINGULAIR) 10 MG tablet TAKE 1 TABLET (10 MG TOTAL) BY MOUTH AT BEDTIME. 09/01/15  Yes Gildardo Cranker, DO  sevelamer carbonate (RENVELA) 800 MG tablet Take 1,600-2,400 mg by mouth 3 (three) times daily with meals. Take 2400 to 3200 mg with each me (based on the size of the meal) 3 times daily and take 800 mg with large snacks   Yes [provider]  temazepam (RESTORIL) 30 MG capsule Take 30 mg by mouth at bedtime.  11/10/15  Yes [provider]    oxyCODONE-acetaminophen (PERCOCET) 5-325 MG tablet Take 1 tablet by mouth every 4 (four) hours as needed. 01/09/17   Pisciotta, Elmyra Ricks, PA-C  oxyCODONE-acetaminophen (PERCOCET) 5-325 MG tablet Take 1 tablet by mouth every 4 (four) hours as needed. 01/09/17   Pisciotta, Elmyra Ricks, PA-C   Dg Ankle Complete Left  Result Date: 01/09/2017 CLINICAL DATA:  Patient reports recent fall, patient reports already having swelling of his entire left leg, reports he has noticed more swelling, reports severe pain to left side, reports not being able to put pressure on left knee, general lef.*comment was truncated* EXAM: LEFT ANKLE COMPLETE - 3+ VIEW COMPARISON:  Foot films dictated separately.  Prior of 11/09/2016. FINDINGS: Diffuse soft tissue swelling about the ankle. Degenerate changes about the hindfoot. No acute fracture or dislocation. IMPRESSION: Soft tissue swelling with degenerative changes.  No acute findings. Electronically Signed   By: Abigail Miyamoto M.D.   On: 01/09/2017 13:40   Dg Knee Complete 4 Views Left  Result Date: 01/09/2017 CLINICAL DATA:  Recent fall with left knee pain EXAM: LEFT KNEE - COMPLETE 4+ VIEW COMPARISON:  08/07/2014 FINDINGS: Large joint effusion is noted with some areas of lucency within somewhat suspicious for septic joint. Displacement of the patella is noted by the effusion. No definitive acute fracture is seen. Diffuse soft tissue swelling is noted below the knee joint. IMPRESSION: Large joint effusion with some areas of lucency suspicious for a septic joint. Clinical correlation is recommended. Arthrocentesis may be helpful. No definitive acute fracture is seen. Electronically Signed   By: Inez Catalina M.D.   On: 01/09/2017 13:40   Dg Foot Complete Left  Result Date: 01/09/2017 CLINICAL DATA:  Recent fall.  Increased swelling. EXAM: LEFT FOOT - COMPLETE 3+ VIEW COMPARISON:  11/09/2016 FINDINGS: Re- demonstration of prior first phalanx and fourth transmetatarsal amputations.  Vascular calcifications. Degenerate changes of the midfoot. No acute fracture or dislocation. No gross osseous destruction. No soft tissue gas or well-defined soft tissue ulcer. Diffuse soft tissue swelling, increased since the prior exam. No radiopaque foreign object. IMPRESSION: Postsurgical and degenerative changes, with progressive soft tissue  swelling. No definite acute superimposed osseous abnormality. Electronically Signed   By: Abigail Miyamoto M.D.   On: 01/09/2017 13:39   - pertinent xrays, CT, MRI studies were reviewed and independently interpreted  Positive ROS: All other systems have been reviewed and were otherwise negative with the exception of those mentioned in the HPI and as above.  Physical Exam: General: Alert, no acute distress Psychiatric: Patient is competent for consent with normal mood and affect Lymphatic: No axillary or cervical lymphadenopathy Cardiovascular: No pedal edema Respiratory: No cyanosis, no use of accessory musculature GI: No organomegaly, abdomen is soft and non-tender  Skin:  The skin is intact around the left knee there is no cellulitis no drainage.  Neurologic: Patient does not have protective sensation bilateral lower extremities.   MUSCULOSKELETAL:  Patient has swelling around the left knee superior to the patella.  Patient has a palpable defect of the quad tendon insertion to the patella.  Patient cannot actively lift his leg he has an extensor lag.  Review of the radiographs shows a tilt to the patella secondary to the quad tendon rupture.  Patient is total knee arthroplasty is intact with no loosening no lytic changes.  Assessment: Assessment: Acute quad tendon rupture left knee status post revision total knee arthroplasty.  Plan: Plan: We will plan for quad tendon reconstruction on Wednesday.  Thank you for the consult and the opportunity to see Mr. Matthew Kline, [place] [phone] 9:47 AM

## 2017-01-10 NOTE — H&P (View-Only) (Signed)
ORTHOPAEDIC CONSULTATION  REQUESTING PHYSICIAN: Geradine Girt, DO  Chief Complaint: Left knee pain and weakness status post fall.  HPI: Matthew Kline is a 55 y.o. male who presents with left knee pain weakness and swelling.  Patient states that he had an awkward fall he states he cannot describe it and since that time patient has had pain and weakness and inability to ambulate on the left lower extremity.  Past Medical History:  Diagnosis Date  . Allergy   . Anemia, unspecified   . Anxiety   . Arthralgia 2010   polyarticular  . Arthritis    "back, knees" (05/09/2014)  . CHF (congestive heart failure) ([place])   . CHF (congestive heart failure) ([place]) 07/25/2009   denies  . Chronic lower back pain   . Coronary artery disease   . Coughing    pt. reports that he has drainage from sinus infection  . Diabetic neuropathy ([place])   . ESRD (end stage renal disease) on dialysis [facility])    started 12/2012; "MWF; [facility] "   . GERD (gastroesophageal reflux disease)    hx "before I lost weight", no problem 9 years  . Hemodialysis access site with mature fistula ([place])   . Hemorrhoids, internal 10/2011   small  . History of blood transfusion    "related to the anemia"  . Hypertension   . Insomnia, unspecified   . Knee pain, left   . 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 Long term (current) use of anticoagulants   . Myocardial infarction ([place]) 1995  . Orthostatic hypotension   . Osteomyelitis of foot, left, acute ([place])   . Other chronic postoperative pain   . Pneumonia    "probably 4-5 times" (05/09/2014)  . Polymyalgia rheumatica ([place])   . Renal insufficiency   . Sleep apnea    "lost weight; no more problem" (05/09/2014)  . Stroke ([place]) 01/10/06   denies residual on 05/09/2014  . Type II diabetes mellitus ([place]) dx'd 1995  . Ulcer    diabetic foot   . Unspecified hereditary and idiopathic peripheral neuropathy    feet  . Unspecified osteomyelitis, site  unspecified   . Unspecified vitamin D deficiency    Past Surgical History:  Procedure Laterality Date  . ABDOMINAL AORTOGRAM N/A 08/25/2016   Procedure: ABDOMINAL AORTOGRAM;  Surgeon: Wellington Hampshire, MD;  Location: [place] [facility];  Service: Cardiovascular;  Laterality: N/A;  . AMPUTATION  01/21/2012   Procedure: AMPUTATION RAY;  Surgeon: [name], MD;  Location: [place];  Service: Orthopedics;  Laterality: Left;  Left Foot 4th Ray Amputation  . AMPUTATION Left 05/04/2013   Procedure: AMPUTATION DIGIT;  Surgeon: [name], MD;  Location: [place];  Service: Orthopedics;  Laterality: Left;  Left Great Toe Amputation at [facility]  . ANTERIOR CERVICAL DECOMP/DISCECTOMY FUSION  02/2011  . BASCILIC VEIN TRANSPOSITION Left 10/19/2012   Procedure: BASCILIC VEIN TRANSPOSITION;  Surgeon: Serafina Mitchell, MD;  Location: [place];  Service: Vascular;  Laterality: Left;  . CORONARY ARTERY BYPASS GRAFT     x 5 with lima at [place] WITH ANTIBIOTIC SPACERS Left 08/07/2014   Procedure: Replace Left Total Knee Arthroplasty,  Place Antibiotic Spacer;  Surgeon: [name], MD;  Location: [place];  Service: Orthopedics;  Laterality: Left;  . I&D EXTREMITY Left 05/09/2014   Procedure: Irrigation and Debridement Left Knee and Closure of Total Knee Arthroplasty Incision;  Surgeon: [name], MD;  Location: [place];  Service: Orthopedics;  Laterality: Left;  . I&D KNEE WITH POLY EXCHANGE Left 05/31/2014   Procedure: IRRIGATION AND DEBRIDEMENT LEFT KNEE, PLACE ANTIBIOTIC BEADS,  POLY EXCHANGE;  Surgeon: [name], MD;  Location: [place];  Service: Orthopedics;  Laterality: Left;  . KNEE ARTHROSCOPY Left 08-25-2012  . LOWER EXTREMITY ANGIOGRAPHY Left 08/25/2016   Procedure: Lower Extremity Angiography;  Surgeon: Wellington Hampshire, MD;  Location: [place] [facility];  Service: Cardiovascular;  Laterality: Left;  . PERIPHERAL VASCULAR BALLOON ANGIOPLASTY Left 08/25/2016    Procedure: PERIPHERAL VASCULAR BALLOON ANGIOPLASTY;  Surgeon: Wellington Hampshire, MD;  Location: [place] [facility];  Service: Cardiovascular;  Laterality: Left;  lt peroneal and ant tibial arteries cutting balloon  . REFRACTIVE SURGERY Bilateral   . TOE AMPUTATION Bilateral    "I've lost 7 toes over the last 7 years" (05/09/2014)  . TOE SURGERY Left April 2015   Big toe removed on left foot.  . TONSILLECTOMY    . TOTAL KNEE ARTHROPLASTY Left 04/10/2014   Procedure: TOTAL KNEE ARTHROPLASTY;  Surgeon: [name], MD;  Location: [place];  Service: Orthopedics;  Laterality: Left;  . TOTAL KNEE REVISION Left 10/25/2014   Procedure: LEFT TOTAL KNEE REVISION;  Surgeon: [name], MD;  Location: [place];  Service: Orthopedics;  Laterality: Left;  . TOTAL KNEE REVISION Left 11/26/2015   Procedure: Removal Left Total Knee Arthroplasty, Hinged Total Knee Arthroplasty;  Surgeon: [name], MD;  Location: [place];  Service: Orthopedics;  Laterality: Left;  . UVULOPALATOPHARYNGOPLASTY, TONSILLECTOMY AND SEPTOPLASTY  ~ 1989  . WOUND DEBRIDEMENT Left 05/09/2014   Dehiscence Left Total Knee Arthroplasty Incision   Social History   Socioeconomic History  . Marital status: Married    Spouse name: None  . Number of children: None  . Years of education: None  . Highest education level: None  Social Needs  . Financial resource strain: None  . Food insecurity - worry: None  . Food insecurity - inability: None  . Transportation needs - medical: None  . Transportation needs - non-medical: None  Occupational History  . None  Tobacco Use  . Smoking status: Former Smoker    Packs/day: 0.12    Years: 32.00    Pack years: 3.84    Types: Cigarettes    Last attempt to quit: 05/28/2016    Years since quitting: 0.6  . Smokeless tobacco: Former Systems developer    Quit date: 10/06/2014  . Tobacco comment: 3 -4 cig daily   Substance and Sexual Activity  . Alcohol use: No    Alcohol/week: 0.0 oz  . Drug use: No  .  Sexual activity: Yes  Other Topics Concern  . None  Social History Narrative  . None   Family History  Problem Relation Age of Onset  . Hypertension Mother   . Cancer Mother 46       Ovarian  . Heart disease Maternal Aunt   . Stroke Maternal Grandfather    - negative except otherwise stated in the family history section Allergies  Allergen Reactions  . Morphine And Related Other (See Comments)    hallucinations  . Tygacil [Tigecycline] Nausea And Vomiting and Other (See Comments)    Makes him feel crazy   Prior to Admission medications   Medication Sig Start Date End Date Taking? Authorizing Provider  amLODipine (NORVASC) 5 MG tablet Take 5 mg by mouth daily at 6 PM.  Yes [provider]  aspirin 81 MG EC tablet TAKE 1 TABLET EVERY DAY Patient taking differently: TAKE 1 TABLET (81 MG) BY MOUTH EVERY DAY 08/05/15  Yes Eulas Post, Monica, DO  atorvastatin (LIPITOR) 40 MG tablet Take 40 mg by mouth daily at 6 PM.   Yes [provider]  carvedilol (COREG) 25 MG tablet Take 25 mg by mouth daily at 6 PM.    Yes [provider]  clopidogrel (PLAVIX) 75 MG tablet Take 1 tablet (75 mg total) by mouth daily. Patient taking differently: Take 75 mg by mouth daily at 6 PM.  08/25/16 08/25/17 Yes Arida, Mertie Clause, MD  gabapentin (NEURONTIN) 300 MG capsule TAKE ONE CAPSULE BY MOUTH 3 TIMES A DAY Patient taking differently: TAKE ONE CAPSULE (300 MG) BY MOUTH 3 TIMES DAILY 02/20/16  Yes Dondra Prader R, NP  montelukast (SINGULAIR) 10 MG tablet TAKE 1 TABLET (10 MG TOTAL) BY MOUTH AT BEDTIME. 09/01/15  Yes Gildardo Cranker, DO  sevelamer carbonate (RENVELA) 800 MG tablet Take 1,600-2,400 mg by mouth 3 (three) times daily with meals. Take 2400 to 3200 mg with each me (based on the size of the meal) 3 times daily and take 800 mg with large snacks   Yes [provider]  temazepam (RESTORIL) 30 MG capsule Take 30 mg by mouth at bedtime.  11/10/15  Yes [provider]    oxyCODONE-acetaminophen (PERCOCET) 5-325 MG tablet Take 1 tablet by mouth every 4 (four) hours as needed. 01/09/17   Pisciotta, Elmyra Ricks, PA-C  oxyCODONE-acetaminophen (PERCOCET) 5-325 MG tablet Take 1 tablet by mouth every 4 (four) hours as needed. 01/09/17   Pisciotta, Elmyra Ricks, PA-C   Dg Ankle Complete Left  Result Date: 01/09/2017 CLINICAL DATA:  Patient reports recent fall, patient reports already having swelling of his entire left leg, reports he has noticed more swelling, reports severe pain to left side, reports not being able to put pressure on left knee, general lef.*comment was truncated* EXAM: LEFT ANKLE COMPLETE - 3+ VIEW COMPARISON:  Foot films dictated separately.  Prior of 11/09/2016. FINDINGS: Diffuse soft tissue swelling about the ankle. Degenerate changes about the hindfoot. No acute fracture or dislocation. IMPRESSION: Soft tissue swelling with degenerative changes.  No acute findings. Electronically Signed   By: Abigail Miyamoto M.D.   On: 01/09/2017 13:40   Dg Knee Complete 4 Views Left  Result Date: 01/09/2017 CLINICAL DATA:  Recent fall with left knee pain EXAM: LEFT KNEE - COMPLETE 4+ VIEW COMPARISON:  08/07/2014 FINDINGS: Large joint effusion is noted with some areas of lucency within somewhat suspicious for septic joint. Displacement of the patella is noted by the effusion. No definitive acute fracture is seen. Diffuse soft tissue swelling is noted below the knee joint. IMPRESSION: Large joint effusion with some areas of lucency suspicious for a septic joint. Clinical correlation is recommended. Arthrocentesis may be helpful. No definitive acute fracture is seen. Electronically Signed   By: Inez Catalina M.D.   On: 01/09/2017 13:40   Dg Foot Complete Left  Result Date: 01/09/2017 CLINICAL DATA:  Recent fall.  Increased swelling. EXAM: LEFT FOOT - COMPLETE 3+ VIEW COMPARISON:  11/09/2016 FINDINGS: Re- demonstration of prior first phalanx and fourth transmetatarsal amputations.  Vascular calcifications. Degenerate changes of the midfoot. No acute fracture or dislocation. No gross osseous destruction. No soft tissue gas or well-defined soft tissue ulcer. Diffuse soft tissue swelling, increased since the prior exam. No radiopaque foreign object. IMPRESSION: Postsurgical and degenerative changes, with progressive soft tissue  swelling. No definite acute superimposed osseous abnormality. Electronically Signed   By: Abigail Miyamoto M.D.   On: 01/09/2017 13:39   - pertinent xrays, CT, MRI studies were reviewed and independently interpreted  Positive ROS: All other systems have been reviewed and were otherwise negative with the exception of those mentioned in the HPI and as above.  Physical Exam: General: Alert, no acute distress Psychiatric: Patient is competent for consent with normal mood and affect Lymphatic: No axillary or cervical lymphadenopathy Cardiovascular: No pedal edema Respiratory: No cyanosis, no use of accessory musculature GI: No organomegaly, abdomen is soft and non-tender  Skin:  The skin is intact around the left knee there is no cellulitis no drainage.  Neurologic: Patient does not have protective sensation bilateral lower extremities.   MUSCULOSKELETAL:  Patient has swelling around the left knee superior to the patella.  Patient has a palpable defect of the quad tendon insertion to the patella.  Patient cannot actively lift his leg he has an extensor lag.  Review of the radiographs shows a tilt to the patella secondary to the quad tendon rupture.  Patient is total knee arthroplasty is intact with no loosening no lytic changes.  Assessment: Assessment: Acute quad tendon rupture left knee status post revision total knee arthroplasty.  Plan: Plan: We will plan for quad tendon reconstruction on Wednesday.  Thank you for the consult and the opportunity to see Matthew Kline, [place] [phone] 9:47 AM

## 2017-01-11 LAB — CBC
HEMATOCRIT: 31.6 % — AB (ref 39.0–52.0)
Hemoglobin: 9.8 g/dL — ABNORMAL LOW (ref 13.0–17.0)
MCH: 27.7 pg (ref 26.0–34.0)
MCHC: 31 g/dL (ref 30.0–36.0)
MCV: 89.3 fL (ref 78.0–100.0)
Platelets: 159 10*3/uL (ref 150–400)
RBC: 3.54 MIL/uL — ABNORMAL LOW (ref 4.22–5.81)
RDW: 18 % — AB (ref 11.5–15.5)
WBC: 8.1 10*3/uL (ref 4.0–10.5)

## 2017-01-11 LAB — RENAL FUNCTION PANEL
ALBUMIN: 2.2 g/dL — AB (ref 3.5–5.0)
Anion gap: 18 — ABNORMAL HIGH (ref 5–15)
BUN: 62 mg/dL — ABNORMAL HIGH (ref 6–20)
CO2: 27 mmol/L (ref 22–32)
Calcium: 9.1 mg/dL (ref 8.9–10.3)
Chloride: 87 mmol/L — ABNORMAL LOW (ref 101–111)
Creatinine, Ser: 8.05 mg/dL — ABNORMAL HIGH (ref 0.61–1.24)
GFR calc Af Amer: 8 mL/min — ABNORMAL LOW (ref >60)
GFR calc non Af Amer: 7 mL/min — ABNORMAL LOW (ref >60)
GLUCOSE: 137 mg/dL — AB (ref 65–99)
PHOSPHORUS: 5.9 mg/dL — AB (ref 2.5–4.6)
POTASSIUM: 4.9 mmol/L (ref 3.5–5.1)
SODIUM: 132 mmol/L — AB (ref 135–145)

## 2017-01-11 LAB — GLUCOSE, CAPILLARY
GLUCOSE-CAPILLARY: 145 mg/dL — AB (ref 65–99)
Glucose-Capillary: 109 mg/dL — ABNORMAL HIGH (ref 65–99)
Glucose-Capillary: 107 mg/dL — ABNORMAL HIGH (ref 65–99)

## 2017-01-11 MED ORDER — HYDROXYZINE HCL 25 MG PO TABS
25.0000 mg | ORAL_TABLET | Freq: Once | ORAL | Status: AC
  Administered 2017-01-11: 25 mg via ORAL
  Filled 2017-01-11: qty 1

## 2017-01-11 MED ORDER — DIPHENHYDRAMINE HCL 25 MG PO CAPS
ORAL_CAPSULE | ORAL | Status: AC
Start: 2017-01-11 — End: 2017-01-11
  Administered 2017-01-11: 25 mg via ORAL
  Filled 2017-01-11: qty 1

## 2017-01-11 MED ORDER — HEPARIN SODIUM (PORCINE) 1000 UNIT/ML DIALYSIS
1000.0000 [IU] | INTRAMUSCULAR | Status: DC | PRN
Start: 2017-01-11 — End: 2017-01-11

## 2017-01-11 MED ORDER — SODIUM CHLORIDE 0.9 % IV SOLN: 100.0000 mL | INTRAVENOUS | Status: DC | PRN

## 2017-01-11 MED ORDER — ALTEPLASE 2 MG IJ SOLR: 2.0000 mg | Freq: Once | INTRAMUSCULAR | Status: DC | PRN

## 2017-01-11 MED ORDER — PENTAFLUOROPROP-TETRAFLUOROETH EX AERO
1.0000 | INHALATION_SPRAY | CUTANEOUS | Status: DC | PRN
Start: 2017-01-11 — End: 2017-01-11

## 2017-01-11 MED ORDER — SODIUM CHLORIDE 0.9 % IV SOLN
100.0000 mL | INTRAVENOUS | Status: DC | PRN
Start: 2017-01-11 — End: 2017-01-11

## 2017-01-11 MED ORDER — LIDOCAINE HCL (PF) 1 % IJ SOLN: 5.0000 mL | INTRAMUSCULAR | Status: DC | PRN

## 2017-01-11 MED ORDER — LIDOCAINE-PRILOCAINE 2.5-2.5 % EX CREA: 1.0000 "application " | TOPICAL_CREAM | CUTANEOUS | Status: DC | PRN

## 2017-01-11 MED ORDER — CAMPHOR-MENTHOL 0.5-0.5 % EX LOTN
TOPICAL_LOTION | CUTANEOUS | Status: DC | PRN
  Administered 2017-01-11: 23:00:00 via TOPICAL
  Filled 2017-01-11: qty 222

## 2017-01-11 MED ORDER — DIPHENHYDRAMINE HCL 25 MG PO CAPS
25.0000 mg | ORAL_CAPSULE | Freq: Once | ORAL | Status: AC
  Administered 2017-01-11 (×2): 25 mg via ORAL

## 2017-01-11 NOTE — Progress Notes (Signed)
HD tx initiated via 15Gx 2 w/o problem until after tx was started, couldn't reach prescribed BFR, pt states MDs aware he has trouble w/ increased arterial pressure and can only usually reach a BFR of 350, I reached 400 but only for a few mins and then had to turn down to 335, pt states he thinks he's about to have a shuntagram. VSS, will cont to monitor while on HD tx until I hand off to dayshift HD RN

## 2017-01-11 NOTE — Progress Notes (Signed)
Patient in dialysis. Unable to assess at the moment. Will complete assessment when patient returns. Joslyn Hy, MSN, RN, [employer]

## 2017-01-11 NOTE — Procedures (Signed)
I was present at this dialysis session. I have reviewed the session itself and made appropriate changes.   Filed Weights   01/11/17 0532  Weight: 88.3 kg (194 lb 10.7 oz)    Recent Labs  Lab 01/11/17 0440  NA 132*  K 4.9  CL 87*  CO2 27  GLUCOSE 137*  BUN 62*  CREATININE 8.05*  CALCIUM 9.1  PHOS 5.9*    Recent Labs  Lab 01/09/17 1243 01/10/17 0502 01/11/17 0440  WBC 10.9* 10.0 8.1  NEUTROABS 9.8*  --   --   HGB 10.3* 9.7* 9.8*  HCT 32.7* 31.9* 31.6*  MCV 90.6 91.7 89.3  PLT 162 146* 159    Scheduled Meds: . diphenhydrAMINE      . amLODipine  5 mg Oral QHS  . aspirin EC  162 mg Oral Daily  . atorvastatin  40 mg Oral q1800  . carvedilol  25 mg Oral BID WC  . clopidogrel  75 mg Oral Daily  . gabapentin  300 mg Oral TID  . heparin  5,000 Units Subcutaneous Q8H  . insulin aspart  0-9 Units Subcutaneous TID WC  . lidocaine (PF)  5 mL Intradermal Once  . montelukast  10 mg Oral QHS  . sevelamer carbonate  2,400-3,200 mg Oral TID WC   Continuous Infusions: . sodium chloride    . sodium chloride     PRN Meds:.sodium chloride, sodium chloride, acetaminophen **OR** acetaminophen, albuterol, alteplase, heparin, lidocaine (PF), lidocaine-prilocaine, ondansetron **OR** ondansetron (ZOFRAN) IV, oxyCODONE-acetaminophen, pentafluoroprop-tetrafluoroeth, temazepam   Donetta Potts,  MD 01/11/2017, 8:54 AM

## 2017-01-11 NOTE — Progress Notes (Signed)
PROGRESS NOTE    Matthew Kline  WVP:[id number] DOB: 1960-09-18 DOA: 01/09/2017 PCP: Sandi Mariscal, MD   Outpatient Specialists:     Brief Narrative:  Matthew Kline is a 57 y.o. male with medical history significant of ESRD on HD(M/W/F), CHF, CAD s/p CABG 05/2016, PAD, CVA, anemia, and chronic ulcer with h/o osteomyelitis of the left foot; who presents after having a fall at home a few days ago after his left knee gave way.  He reports falling onto his side with no loss of consciousness or trauma to his head.  However, thereafter patient noted that he was unable to ambulate due to severe pain in the left foot and knee.  He was taking Tylenol at home without much relief.  Due to his symptoms he missed dialysis today.  The ulcer of his left foot is noted to chronically drain fluid and he is followed by the wound care clinic for this.  Dr. Dellia Nims of the wound care clinic had obtained a culture of the fluid 3 days ago.      Assessment & Plan:   Principal Problem:   Fall Active Problems:   Anemia of chronic disease   Leukocytosis   Essential hypertension, benign   Dehiscence of closure of skin   End-stage renal disease on hemodialysis (HCC)   CAD, multiple vessel   Peripheral neuropathy   Ulcer of left midfoot with fat layer exposed ([place])   Gait disturbance   Staphylococcus aureus infection   Effusion of left knee   Fall initial encounter, gait disturbance: Acute.   -PT Eval after surgery  Left knee effusion due to acute quadricepts tendon rupture -plan for surgery on Wednesday by Dr. Sharol Given   Diabetic foot ulcer, positive Staphylococcus aureus: Patient followed by wound care center in the outpatient setting by Dr. Dellia Nims.   -seen by wound care continue same regimen -outpatient wound care follow up -hold on abx for now  Leukocytosis:  -trending down  ESRD on HD:  -plan for HD today  Essential hypertension - Continue Coreg and amlodipine   Anemia of chronic  disease:  -per renal  CAD, PAD: Patient status post CABG in 05/2016 and has had previous endovascular intervention for peripheral artery disease. - Continue Plavix and aspirin  Hyperlipidemia - Continue atorvastatin  Diabetes mellitus type 2: Patient currently controlled with diet.  Last hemoglobin A1c 6.4 on 11/08/2016. - SSI  Peripheral neuropathy - Continue gabapentin     DVT prophylaxis:  SQ Heparin  Code Status: Full Code   Family Communication: None at bedside  Disposition Plan:     Consultants:   Renal  ortho     Subjective: C/o pain but unable to tell me where it is  Objective: Vitals:   01/11/17 0900 01/11/17 0930 01/11/17 0958 01/11/17 1319  BP: 130/77 (!) 146/79 137/78 134/66  Pulse: 87 90 93 93  Resp: (!) 24 (!) 22 15 16   Temp:   99 F (37.2 C) 99.2 F (37.3 C)  TempSrc:   Oral Oral  SpO2: 93%  95% 93%  Weight:   83 kg (182 lb 15.7 oz)     Intake/Output Summary (Last 24 hours) at 01/11/2017 1333 Last data filed at 01/11/2017 0958 Gross per 24 hour  Intake -  Output 4000 ml  Net -4000 ml   Filed Weights   01/11/17 0532 01/11/17 0958  Weight: 88.3 kg (194 lb 10.7 oz) 83 kg (182 lb 15.7 oz)    Examination:  General exam: chronically ill  appearing Respiratory system: no wheezing Cardiovascular system: rrr Gastrointestinal system: +Bs, soft Central nervous system: alert with left arm tremor Extremities: soft tissue swelling around knee Psych: flat affect- poor eye contact     Data Reviewed: I have personally reviewed following labs and imaging studies  CBC: Recent Labs  Lab 01/09/17 1243 01/10/17 0502 01/11/17 0440  WBC 10.9* 10.0 8.1  NEUTROABS 9.8*  --   --   HGB 10.3* 9.7* 9.8*  HCT 32.7* 31.9* 31.6*  MCV 90.6 91.7 89.3  PLT 162 146* 161   Basic Metabolic Panel: Recent Labs  Lab 01/09/17 1243 01/10/17 0502 01/11/17 0440  NA 133* 134* 132*  K 4.1 5.0 4.9  CL 93* 93* 87*  CO2 27 28 27   GLUCOSE 124* 99  137*  BUN 40* 48* 62*  CREATININE 5.99* 6.83* 8.05*  CALCIUM 8.9 8.9 9.1  PHOS  --   --  5.9*   GFR: Estimated Creatinine Clearance: 10.8 mL/min (A) (by C-G formula based on SCr of 8.05 mg/dL (H)). Liver Function Tests: Recent Labs  Lab 01/11/17 0440  ALBUMIN 2.2*   No results for input(s): LIPASE, AMYLASE in the last 168 hours. No results for input(s): AMMONIA in the last 168 hours. Coagulation Profile: No results for input(s): INR, PROTIME in the last 168 hours. Cardiac Enzymes: No results for input(s): CKTOTAL, CKMB, CKMBINDEX, TROPONINI in the last 168 hours. BNP (last 3 results) No results for input(s): PROBNP in the last 8760 hours. HbA1C: No results for input(s): HGBA1C in the last 72 hours. CBG: Recent Labs  Lab 01/09/17 2239 01/10/17 2206 01/11/17 1153  GLUCAP 111* 150* 109*   Lipid Profile: No results for input(s): CHOL, HDL, LDLCALC, TRIG, CHOLHDL, LDLDIRECT in the last 72 hours. Thyroid Function Tests: No results for input(s): TSH, T4TOTAL, FREET4, T3FREE, THYROIDAB in the last 72 hours. Anemia Panel: No results for input(s): VITAMINB12, FOLATE, FERRITIN, TIBC, IRON, RETICCTPCT in the last 72 hours. Urine analysis:    Component Value Date/Time   COLORURINE YELLOW 01/03/2012 1335   APPEARANCEUR Clear 07/12/2013 0918   LABSPEC 1.021 01/03/2012 1335   PHURINE 6.0 01/03/2012 1335   GLUCOSEU Trace (A) 07/12/2013 0918   HGBUR TRACE (A) 01/03/2012 1335   BILIRUBINUR Negative 07/12/2013 [place] 01/03/2012 1335   PROTEINUR 3+ (A) 07/12/2013 0918   PROTEINUR >300 (A) 01/03/2012 1335   UROBILINOGEN 0.2 01/03/2012 1335   NITRITE Negative 07/12/2013 0918   NITRITE NEGATIVE 01/03/2012 1335   LEUKOCYTESUR Negative 07/12/2013 0918     ) Recent Results (from the past 240 hour(s))  Aerobic Culture (superficial specimen)     Status: None   Collection Time: 01/06/17 12:32 PM  Result Value Ref Range Status   Specimen Description FOOT LEFT  PLANTAR  Final   Special Requests NONE  Final   Gram Stain   Final    NO WBC SEEN ABUNDANT GRAM POSITIVE COCCI IN PAIRS IN CLUSTERS ABUNDANT GRAM NEGATIVE RODS FEW GRAM POSITIVE RODS    Culture   Final    MODERATE STAPHYLOCOCCUS AUREUS WITHIN MIXED ORGANISMS Performed at [facility], [place] [address] [place], [place] [address]    Report Status 01/09/2017 FINAL  Final   Organism ID, Bacteria STAPHYLOCOCCUS AUREUS  Final      Susceptibility   Staphylococcus aureus - MIC*    CIPROFLOXACIN <=0.5 SENSITIVE Sensitive     ERYTHROMYCIN >=8 RESISTANT Resistant     GENTAMICIN <=0.5 SENSITIVE Sensitive     OXACILLIN <=0.25  SENSITIVE Sensitive     TETRACYCLINE <=1 SENSITIVE Sensitive     VANCOMYCIN <=0.5 SENSITIVE Sensitive     TRIMETH/SULFA <=10 SENSITIVE Sensitive     CLINDAMYCIN <=0.25 SENSITIVE Sensitive     RIFAMPIN <=0.5 SENSITIVE Sensitive     Inducible Clindamycin NEGATIVE Sensitive     * MODERATE STAPHYLOCOCCUS AUREUS  MRSA PCR Screening     Status: None   Collection Time: 01/10/17  8:53 PM  Result Value Ref Range Status   MRSA by PCR NEGATIVE NEGATIVE Final    Comment:        The GeneXpert MRSA Assay (FDA approved for NASAL specimens only), is one component of a comprehensive MRSA colonization surveillance program. It is not intended to diagnose MRSA infection nor to guide or monitor treatment for MRSA infections.       Anti-infectives (From admission, onward)   Start     Dose/Rate Route Frequency Ordered Stop   01/09/17 2200  clindamycin (CLEOCIN) capsule 300 mg  Status:  Discontinued     300 mg Oral Every 8 hours 01/09/17 2019 01/10/17 1002       Radiology Studies: No results found.      Scheduled Meds: . amLODipine  5 mg Oral QHS  . aspirin EC  162 mg Oral Daily  . atorvastatin  40 mg Oral q1800  . carvedilol  25 mg Oral BID WC  . clopidogrel  75 mg Oral Daily  . diphenhydrAMINE      . gabapentin  300 mg Oral TID  . heparin  5,000 Units  Subcutaneous Q8H  . insulin aspart  0-9 Units Subcutaneous TID WC  . lidocaine (PF)  5 mL Intradermal Once  . montelukast  10 mg Oral QHS  . sevelamer carbonate  2,400-3,200 mg Oral TID WC   Continuous Infusions:   LOS: 1 day    Time spent: 25 min    Geradine Girt, DO [facility] Pager [phone]  If 7PM-7AM, please contact night-coverage [email or web address] Password [email or web address] 01/11/2017, 1:33 PM

## 2017-01-11 NOTE — Progress Notes (Signed)
Patient to Dialysis at this time.

## 2017-01-12 ENCOUNTER — Encounter (HOSPITAL_COMMUNITY): Payer: Self-pay | Admitting: *Deleted

## 2017-01-12 ENCOUNTER — Other Ambulatory Visit (INDEPENDENT_AMBULATORY_CARE_PROVIDER_SITE_OTHER): Payer: Self-pay | Admitting: Family

## 2017-01-12 ENCOUNTER — Inpatient Hospital Stay (HOSPITAL_COMMUNITY): Payer: Medicare HMO | Admitting: Anesthesiology

## 2017-01-12 ENCOUNTER — Encounter (HOSPITAL_COMMUNITY)
Admission: EM | Disposition: A | Discharge status: Skilled Nursing Facility | Payer: Self-pay | Source: Home / Self Care | Attending: Internal Medicine

## 2017-01-12 DIAGNOSIS — M00862 Arthritis due to other bacteria, left knee: Secondary | ICD-10-CM

## 2017-01-12 DIAGNOSIS — T8450XA Infection and inflammatory reaction due to unspecified internal joint prosthesis, initial encounter: Secondary | ICD-10-CM

## 2017-01-12 HISTORY — PX: IRRIGATION AND DEBRIDEMENT KNEE: SHX5185

## 2017-01-12 LAB — GLUCOSE, CAPILLARY
GLUCOSE-CAPILLARY: 105 mg/dL — AB (ref 65–99)
GLUCOSE-CAPILLARY: 106 mg/dL — AB (ref 65–99)
GLUCOSE-CAPILLARY: 100 mg/dL — AB (ref 65–99)
GLUCOSE-CAPILLARY: 136 mg/dL — AB (ref 65–99)
Glucose-Capillary: 103 mg/dL — ABNORMAL HIGH (ref 65–99)

## 2017-01-12 LAB — SURGICAL PCR SCREEN
MRSA, PCR: NEGATIVE
Staphylococcus aureus: POSITIVE — AB

## 2017-01-12 SURGERY — IRRIGATION AND DEBRIDEMENT KNEE
Anesthesia: General | Laterality: Left

## 2017-01-12 MED ORDER — CHLORHEXIDINE GLUCONATE 4 % EX LIQD
60.0000 mL | Freq: Once | CUTANEOUS | Status: AC
  Administered 2017-01-12: 4 via TOPICAL
  Filled 2017-01-12: qty 60

## 2017-01-12 MED ORDER — MEPERIDINE HCL 25 MG/ML IJ SOLN: 6.2500 mg | INTRAMUSCULAR | Status: DC | PRN

## 2017-01-12 MED ORDER — PIPERACILLIN-TAZOBACTAM 3.375 G IVPB
3.3750 g | Freq: Two times a day (BID) | INTRAVENOUS | Status: DC
  Administered 2017-01-12 – 2017-01-14 (×4): 3.375 g via INTRAVENOUS
  Filled 2017-01-12 (×6): qty 50

## 2017-01-12 MED ORDER — ACETAMINOPHEN 325 MG PO TABS: 650.0000 mg | ORAL_TABLET | ORAL | Status: DC | PRN

## 2017-01-12 MED ORDER — LACTATED RINGERS IV SOLN
INTRAVENOUS | Status: DC
Start: 2017-01-12 — End: 2017-01-19

## 2017-01-12 MED ORDER — OXYCODONE HCL 5 MG PO TABS: 5.0000 mg | ORAL_TABLET | ORAL | Status: DC | PRN

## 2017-01-12 MED ORDER — FENTANYL CITRATE (PF) 100 MCG/2ML IJ SOLN: 25.0000 ug | INTRAMUSCULAR | Status: DC | PRN

## 2017-01-12 MED ORDER — PROPOFOL 10 MG/ML IV BOLUS
INTRAVENOUS | Status: AC
  Filled 2017-01-12: qty 20

## 2017-01-12 MED ORDER — CISATRACURIUM BESYLATE 20 MG/10ML IV SOLN
INTRAVENOUS | Status: AC
  Filled 2017-01-12: qty 10

## 2017-01-12 MED ORDER — OXYCODONE HCL 5 MG PO TABS
10.0000 mg | ORAL_TABLET | ORAL | Status: DC | PRN
  Administered 2017-01-12 – 2017-01-14 (×3): 10 mg via ORAL
  Filled 2017-01-12 (×3): qty 2

## 2017-01-12 MED ORDER — VANCOMYCIN HCL 10 G IV SOLR
2000.0000 mg | Freq: Once | INTRAVENOUS | Status: AC
  Administered 2017-01-12: 2000 mg via INTRAVENOUS
  Filled 2017-01-12: qty 2000

## 2017-01-12 MED ORDER — CHLORHEXIDINE GLUCONATE CLOTH 2 % EX PADS
6.0000 | MEDICATED_PAD | Freq: Every day | CUTANEOUS | Status: DC
  Administered 2017-01-14: 6 via TOPICAL

## 2017-01-12 MED ORDER — ONDANSETRON HCL 4 MG/2ML IJ SOLN
INTRAMUSCULAR | Status: AC
  Filled 2017-01-12: qty 2

## 2017-01-12 MED ORDER — MIDAZOLAM HCL 2 MG/2ML IJ SOLN
1.0000 mg | Freq: Once | INTRAMUSCULAR | Status: AC
  Administered 2017-01-12: 1 mg via INTRAVENOUS

## 2017-01-12 MED ORDER — SODIUM CHLORIDE 0.9 % IR SOLN
Status: DC | PRN
  Administered 2017-01-12: 3000 mL

## 2017-01-12 MED ORDER — VANCOMYCIN HCL IN DEXTROSE 1-5 GM/200ML-% IV SOLN
1000.0000 mg | INTRAVENOUS | Status: DC
  Administered 2017-01-13: 1 g via INTRAVENOUS
  Filled 2017-01-12: qty 200

## 2017-01-12 MED ORDER — DOCUSATE SODIUM 100 MG PO CAPS
100.0000 mg | ORAL_CAPSULE | Freq: Two times a day (BID) | ORAL | Status: DC
  Administered 2017-01-12 – 2017-01-19 (×8): 100 mg via ORAL
  Filled 2017-01-12 (×8): qty 1

## 2017-01-12 MED ORDER — CEFAZOLIN SODIUM-DEXTROSE 2-4 GM/100ML-% IV SOLN
INTRAVENOUS | Status: AC
  Filled 2017-01-12: qty 100

## 2017-01-12 MED ORDER — HYDROMORPHONE HCL 1 MG/ML IJ SOLN: 0.2500 mg | INTRAMUSCULAR | Status: DC | PRN

## 2017-01-12 MED ORDER — ONDANSETRON HCL 4 MG/2ML IJ SOLN
4.0000 mg | Freq: Four times a day (QID) | INTRAMUSCULAR | Status: DC | PRN
Start: 2017-01-12 — End: 2017-01-19

## 2017-01-12 MED ORDER — PROPOFOL 10 MG/ML IV BOLUS
INTRAVENOUS | Status: DC | PRN
  Administered 2017-01-12: 150 mg via INTRAVENOUS

## 2017-01-12 MED ORDER — MUPIROCIN 2 % EX OINT: 1.0000 "application " | TOPICAL_OINTMENT | Freq: Two times a day (BID) | CUTANEOUS | Status: DC

## 2017-01-12 MED ORDER — PHENYLEPHRINE 40 MCG/ML (10ML) SYRINGE FOR IV PUSH (FOR BLOOD PRESSURE SUPPORT)
PREFILLED_SYRINGE | INTRAVENOUS | Status: AC
  Filled 2017-01-12: qty 10

## 2017-01-12 MED ORDER — PHENYLEPHRINE HCL 10 MG/ML IJ SOLN
INTRAMUSCULAR | Status: DC | PRN
  Administered 2017-01-12 (×2): 100 ug via INTRAVENOUS

## 2017-01-12 MED ORDER — METOCLOPRAMIDE HCL 5 MG/ML IJ SOLN: 5.0000 mg | Freq: Three times a day (TID) | INTRAMUSCULAR | Status: DC | PRN

## 2017-01-12 MED ORDER — SODIUM CHLORIDE 0.9 % IV SOLN
INTRAVENOUS | Status: DC
  Administered 2017-01-12 – 2017-01-14 (×3): via INTRAVENOUS

## 2017-01-12 MED ORDER — VANCOMYCIN HCL IN DEXTROSE 1-5 GM/200ML-% IV SOLN: 1000.0000 mg | INTRAVENOUS | Status: DC

## 2017-01-12 MED ORDER — MIDAZOLAM HCL 2 MG/2ML IJ SOLN
INTRAMUSCULAR | Status: AC
  Administered 2017-01-12: 1 mg via INTRAVENOUS
  Filled 2017-01-12: qty 2

## 2017-01-12 MED ORDER — FENTANYL CITRATE (PF) 100 MCG/2ML IJ SOLN
25.0000 ug | Freq: Once | INTRAMUSCULAR | Status: AC
  Administered 2017-01-12: 25 ug via INTRAVENOUS
  Filled 2017-01-12: qty 2

## 2017-01-12 MED ORDER — POLYETHYLENE GLYCOL 3350 17 G PO PACK: 17.0000 g | PACK | Freq: Every day | ORAL | Status: DC | PRN

## 2017-01-12 MED ORDER — FENTANYL CITRATE (PF) 100 MCG/2ML IJ SOLN
INTRAMUSCULAR | Status: AC
  Filled 2017-01-12: qty 2

## 2017-01-12 MED ORDER — METHOCARBAMOL 500 MG PO TABS
500.0000 mg | ORAL_TABLET | Freq: Four times a day (QID) | ORAL | Status: DC | PRN
  Administered 2017-01-12 – 2017-01-14 (×3): 500 mg via ORAL
  Filled 2017-01-12 (×3): qty 1

## 2017-01-12 MED ORDER — BISACODYL 10 MG RE SUPP: 10.0000 mg | Freq: Every day | RECTAL | Status: DC | PRN

## 2017-01-12 MED ORDER — ACETAMINOPHEN 650 MG RE SUPP: 650.0000 mg | RECTAL | Status: DC | PRN

## 2017-01-12 MED ORDER — FENTANYL CITRATE (PF) 250 MCG/5ML IJ SOLN
INTRAMUSCULAR | Status: AC
  Filled 2017-01-12: qty 5

## 2017-01-12 MED ORDER — ONDANSETRON HCL 4 MG/2ML IJ SOLN: 4.0000 mg | Freq: Once | INTRAMUSCULAR | Status: DC | PRN

## 2017-01-12 MED ORDER — LIDOCAINE HCL (CARDIAC) 20 MG/ML IV SOLN
INTRAVENOUS | Status: DC | PRN
  Administered 2017-01-12: 50 mg via INTRAVENOUS

## 2017-01-12 MED ORDER — METOCLOPRAMIDE HCL 5 MG PO TABS: 5.0000 mg | ORAL_TABLET | Freq: Three times a day (TID) | ORAL | Status: DC | PRN

## 2017-01-12 MED ORDER — MIDAZOLAM HCL 2 MG/2ML IJ SOLN
INTRAMUSCULAR | Status: AC
  Filled 2017-01-12: qty 2

## 2017-01-12 MED ORDER — FENTANYL CITRATE (PF) 100 MCG/2ML IJ SOLN
50.0000 ug | Freq: Once | INTRAMUSCULAR | Status: AC
  Administered 2017-01-12: 50 ug via INTRAVENOUS

## 2017-01-12 MED ORDER — ONDANSETRON HCL 4 MG PO TABS: 4.0000 mg | ORAL_TABLET | Freq: Four times a day (QID) | ORAL | Status: DC | PRN

## 2017-01-12 MED ORDER — CEFAZOLIN SODIUM-DEXTROSE 2-4 GM/100ML-% IV SOLN
2.0000 g | INTRAVENOUS | Status: AC
  Administered 2017-01-12: 2 g via INTRAVENOUS

## 2017-01-12 MED ORDER — LIDOCAINE 2% (20 MG/ML) 5 ML SYRINGE
INTRAMUSCULAR | Status: AC
  Filled 2017-01-12: qty 5

## 2017-01-12 MED ORDER — DEXTROSE 5 % IV SOLN
500.0000 mg | Freq: Four times a day (QID) | INTRAVENOUS | Status: DC | PRN
  Filled 2017-01-12: qty 5

## 2017-01-12 MED ORDER — HYDROMORPHONE HCL 1 MG/ML IJ SOLN
1.0000 mg | INTRAMUSCULAR | Status: DC | PRN
  Administered 2017-01-17 (×2): 1 mg via INTRAVENOUS

## 2017-01-12 MED ORDER — MAGNESIUM CITRATE PO SOLN: 1.0000 | Freq: Once | ORAL | Status: DC | PRN

## 2017-01-12 MED ORDER — SODIUM CHLORIDE 0.9 % IV SOLN
INTRAVENOUS | Status: DC
  Administered 2017-01-12: 16:00:00 via INTRAVENOUS

## 2017-01-12 SURGICAL SUPPLY — 33 items
BNDG COHESIVE 4X5 TAN STRL (GAUZE/BANDAGES/DRESSINGS) ×3 IMPLANT
BNDG COHESIVE 6X5 TAN STRL LF (GAUZE/BANDAGES/DRESSINGS) ×3 IMPLANT
BNDG GAUZE ELAST 4 BULKY (GAUZE/BANDAGES/DRESSINGS) ×3 IMPLANT
COVER SURGICAL LIGHT HANDLE (MISCELLANEOUS) ×3 IMPLANT
DRAPE INCISE IOBAN 66X45 STRL (DRAPES) ×3 IMPLANT
DRAPE U-SHAPE 47X51 STRL (DRAPES) ×3 IMPLANT
DRSG ADAPTIC 3X8 NADH LF (GAUZE/BANDAGES/DRESSINGS) ×3 IMPLANT
DRSG PAD ABDOMINAL 8X10 ST (GAUZE/BANDAGES/DRESSINGS) ×6 IMPLANT
DURAPREP 26ML APPLICATOR (WOUND CARE) ×3 IMPLANT
ELECT REM PT RETURN 9FT ADLT (ELECTROSURGICAL) ×3
ELECTRODE REM PT RTRN 9FT ADLT (ELECTROSURGICAL) ×1 IMPLANT
GAUZE SPONGE 4X4 12PLY STRL (GAUZE/BANDAGES/DRESSINGS) ×3 IMPLANT
GAUZE SPONGE 4X4 12PLY STRL LF (GAUZE/BANDAGES/DRESSINGS) ×3 IMPLANT
GLOVE BIOGEL PI IND STRL 8.5 (GLOVE) ×1 IMPLANT
GLOVE BIOGEL PI INDICATOR 8.5 (GLOVE) ×2
GLOVE SURG ORTHO 9.0 STRL STRW (GLOVE) ×3 IMPLANT
GOWN STRL REUS W/ TWL XL LVL3 (GOWN DISPOSABLE) ×1 IMPLANT
GOWN STRL REUS W/TWL XL LVL3 (GOWN DISPOSABLE) ×2
IMMOBILIZER KNEE 24 THIGH 36 (MISCELLANEOUS) ×1 IMPLANT
IMMOBILIZER KNEE 24 UNIV (MISCELLANEOUS) ×3
KIT BASIN OR (CUSTOM PROCEDURE TRAY) ×3 IMPLANT
KIT ROOM TURNOVER OR (KITS) ×3 IMPLANT
MANIFOLD NEPTUNE II (INSTRUMENTS) ×3 IMPLANT
NS IRRIG 1000ML POUR BTL (IV SOLUTION) ×3 IMPLANT
PACK ORTHO EXTREMITY (CUSTOM PROCEDURE TRAY) ×3 IMPLANT
PAD ABD 8X10 STRL (GAUZE/BANDAGES/DRESSINGS) ×6 IMPLANT
PAD ARMBOARD 7.5X6 YLW CONV (MISCELLANEOUS) ×6 IMPLANT
STOCKINETTE IMPERVIOUS 9X36 MD (GAUZE/BANDAGES/DRESSINGS) ×3 IMPLANT
SUT FIBERWIRE #2 38 T-5 BLUE (SUTURE) ×6
SUTURE FIBERWR #2 38 T-5 BLUE (SUTURE) ×2 IMPLANT
TUBE CONNECTING 12'X1/4 (SUCTIONS) ×1
TUBE CONNECTING 12X1/4 (SUCTIONS) ×2 IMPLANT
YANKAUER SUCT BULB TIP NO VENT (SUCTIONS) ×3 IMPLANT

## 2017-01-12 NOTE — Progress Notes (Signed)
[facility] Progress Note   Subjective:  Seen in room, just got back from surgery this morning. Per ortho notes, found to have abscess when they opened the knee = septic joint. Cultures taken and knee closed, immobilizer brace placed. Per ortho note, likely only option will be L AKA. No CP or dyspnea at this time.  Objective Vitals:   01/12/17 1141 01/12/17 1145 01/12/17 1215 01/12/17 1235  BP: 107/64  111/70 112/66  Pulse: 68 67 70 71  Resp: 14 14 15 16   Temp: 97.6 F (36.4 C)   98.2 F (36.8 C)  TempSrc:    Oral  SpO2: 98% 97% 95% 94%  Weight:      Height:       Physical Exam General: Well appearing man. NAD. Sleep sleepy from anesthesia. Heart: RRR; no murmur Lungs: CTA anteriorly Extremities: 1+ RLE edema, L knee in immobilizer brace, wrapped L foot. Dialysis Access: LUE AVF + thrill  Additional Objective Labs: Basic Metabolic Panel: Recent Labs  Lab 01/09/17 1243 01/10/17 0502 01/11/17 0440  NA 133* 134* 132*  K 4.1 5.0 4.9  CL 93* 93* 87*  CO2 27 28 27   GLUCOSE 124* 99 137*  BUN 40* 48* 62*  CREATININE 5.99* 6.83* 8.05*  CALCIUM 8.9 8.9 9.1  PHOS  --   --  5.9*   Liver Function Tests: Recent Labs  Lab 01/11/17 0440  ALBUMIN 2.2*   CBC: Recent Labs  Lab 01/09/17 1243 01/10/17 0502 01/11/17 0440  WBC 10.9* 10.0 8.1  NEUTROABS 9.8*  --   --   HGB 10.3* 9.7* 9.8*  HCT 32.7* 31.9* 31.6*  MCV 90.6 91.7 89.3  PLT 162 146* 159   Medications: . sodium chloride 10 mL/hr at 01/12/17 0929  . sodium chloride    . lactated ringers    . methocarbamol (ROBAXIN)  IV    . piperacillin-tazobactam (ZOSYN)  IV 3.375 g (01/12/17 1322)  . vancomycin     . amLODipine  5 mg Oral QHS  . aspirin EC  162 mg Oral Daily  . atorvastatin  40 mg Oral q1800  . carvedilol  25 mg Oral BID WC  . Chlorhexidine Gluconate Cloth  6 each Topical Daily  . clopidogrel  75 mg Oral Daily  . docusate sodium  100 mg Oral BID  . fentaNYL      . gabapentin  300 mg  Oral TID  . heparin  5,000 Units Subcutaneous Q8H  . insulin aspart  0-9 Units Subcutaneous TID WC  . lidocaine (PF)  5 mL Intradermal Once  . montelukast  10 mg Oral QHS  . sevelamer carbonate  2,400-3,200 mg Oral TID WC    Dialysis Orders: MWF at [facility] 4hr, BFR 400, DFR 800, EDW 84.5kg, 2K/2Ca, AVF, heparin 4000 bolus - Mircera 274mcg IV q 2 weeks (last given 12/26) - Hectoral 56mcg (just ^'d from 67mcg) IV q HD  Assessment/Plan: 1.  L knee quad tendon rupture: S/p fall. Plan was for surgical repair today, but purulent abscess found during surgery, so abscess cultured and surgery cancelled. 2.  Septic L knee: Per ortho note, L AKA may be only option given chronically infected L foot and L septic knee with Hx TKR (seeded hardware). Vanc/Zosyn started. L AKA tentatively sched for 01/14/17. 3.  ESRD: Usually MWF schedule, off schedule here. Last HD 1/1, will plan for HD tomorrow (1/3) and then again 1/5 since urgery planned for Friday. 4.  Hypertension/volume: BP controlled, edema much improved.  Down to 83kg after last HD, will reduce on d/c. 3L goal tomorrow. 5.  Anemia: Hgb 9.8. Not due for ESA yet. 6.  Metabolic bone disease: Ca ok, Phos ^ slightly. Continue binders Lorin Picket) and VDRA. 7.  CAD 8.  Type 2 DM 9.  PAD (chronic L foot wound, Charcot joint): Per primary. 10.  Nutrition: Alb very low, adding supps.      Veneta Penton, PA-C 01/12/2017, 2:51 PM  [facility] Pager: [phone]  I have seen and examined this patient and agree with plan and assessment in the above note with renal recommendations/intervention highlighted.  Operative findings of left knee noted.  Plan for AKA per Dr. Sharol Given.  Will continue with Hd while he remains an inpatient.  Will get back on schedule by Monday.   Governor Rooks Kedrick Mcnamee,MD 01/12/2017 3:46 PM

## 2017-01-12 NOTE — Interval H&P Note (Signed)
History and Physical Interval Note:  01/12/2017 7:16 AM  Matthew Kline  has presented today for surgery, with the diagnosis of Left Quadricep Tendon Rupture  The various methods of treatment have been discussed with the patient and family. After consideration of risks, benefits and other options for treatment, the patient has consented to  Procedure(s): LEFT QUADRICEP TENDON RECONSTRUCTION (Left) as a surgical intervention .  The patient's history has been reviewed, patient examined, no change in status, stable for surgery.  I have reviewed the patient's chart and labs.  Questions were answered to the patient's satisfaction.     [name]

## 2017-01-12 NOTE — Plan of Care (Signed)
  Progressing Education: Knowledge of General Education information will improve 01/12/2017 1847 - Progressing by Rance Muir, RN

## 2017-01-12 NOTE — Evaluation (Signed)
Physical Therapy Evaluation Patient Details Name: Matthew Kline MRN: [id number] DOB: 05/17/1960 Today's Date: 01/12/2017   History of Present Illness  Pt is 57 y.o. admitted 01/09/17 after a fall at home a few days ago and inability to amb due to L knee pain; found to have quad tendon rupture of L knee s/p L TKA (TKA 2016; revision 2017). Now s/p L knee I&D and quad tendon reconstruction on 01/13/16. Per MD note, plan for tentative L AKA due to recurrent foot ulceration and septic knee. Other PMH includes L toe amp, CAD, HTN, CHF, DM, CVA (2007), anxiety.    Clinical Impression  Pt presents with an overall decrease in functional mobility secondary to above. Prior to fall a few days ago, pt indep with all mobility and lives with daughter; since fall, has been reliant on [facility] for limited amb due to pain. Pt extremely anxious this evening regarding current situation and potential for follow-up surgery, which limited PT evaluation. Pending potential AKA, assume pt with need CIR or SNF-level therapies pending rehab potential and progression. Will follow acutely in order to make these recommendations accordingly.      Follow Up Recommendations Other (comment)(pending pt progression and potential sx)    Equipment Recommendations  Other (comment)(TBD)    Recommendations for Other Services       Precautions / Restrictions Precautions Precautions: Fall Required Braces or Orthoses: Knee Immobilizer - Left Knee Immobilizer - Left: On except when in CPM Restrictions Weight Bearing Restrictions: Yes LLE Weight Bearing: Weight bearing as tolerated      Mobility  Bed Mobility Overal bed mobility: Needs Assistance             General bed mobility comments: Pt initially declining bed mobility despite max education and encouragement, stating "I'll think about it". Willing to perform LE assessment in bed  Transfers                    Ambulation/Gait                Stairs             Wheelchair Mobility    Modified Rankin (Stroke Patients Only)       Balance                                             Pertinent Vitals/Pain Pain Assessment: No/denies pain    Home Living Family/patient expects to be discharged to:: Private residence Living Arrangements: Children Available Help at Discharge: Family;Available PRN/intermittently Type of Home: House Home Access: Stairs to enter Entrance Stairs-Rails: Right Entrance Stairs-Number of Steps: 1 Home Layout: One level Home Equipment: Cane - single point      Prior Function Level of Independence: Independent with assistive device(s)         Comments: Indep with mobility prior to fall. Since fall, has been using SPC secondary to "weakness". Pt drives. 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, newspaper        Extremity/Trunk Assessment   Upper Extremity Assessment Upper Extremity Assessment: Overall WFL for tasks assessed    Lower Extremity Assessment Lower Extremity Assessment: LLE deficits/detail LLE Deficits / Details: L hip flex 3/5 LLE: Unable to fully assess due to immobilization       Communication   Communication: No difficulties  Cognition Arousal/Alertness: Awake/alert Behavior During Therapy: Anxious Overall  Cognitive Status: No family/caregiver present to determine baseline cognitive functioning                                 General Comments: Pt very distracted throughout session regarding potential need for continued sx, despite multiple cues to reattend to current conversation and task; unsure if this is due to pt's h/o anxiety (per chart)      General Comments      Exercises     Assessment/Plan    PT Assessment Patient needs continued PT services  PT Problem List Decreased strength;Decreased range of motion;Decreased activity tolerance;Decreased balance;Decreased mobility;Decreased cognition;Pain       PT Treatment Interventions DME  instruction;Gait training;Stair training;Functional mobility training;Therapeutic activities;Therapeutic exercise;Balance training;Patient/family education;Wheelchair mobility training    PT Goals (Current goals can be found in the Care Plan section)  Acute Rehab PT Goals Patient Stated Goal: Return home  PT Goal Formulation: With patient Time For Goal Achievement: 01/26/17 Potential to Achieve Goals: Good    Frequency Min 3X/week   Barriers to discharge Decreased caregiver support      Co-evaluation               AM-PAC PT "6 Clicks" Daily Activity  Outcome Measure Difficulty turning over in bed (including adjusting bedclothes, sheets and blankets)?: A Little Difficulty moving from lying on back to sitting on the side of the bed? : A Little Difficulty sitting down on and standing up from a chair with arms (e.g., wheelchair, bedside commode, etc,.)?: A Little Help needed moving to and from a bed to chair (including a wheelchair)?: A Little Help needed walking in hospital room?: A Lot Help needed climbing 3-5 steps with a railing? : A Lot 6 Click Score: 16    End of Session   Activity Tolerance: Other (comment)(Limited by anxiety)   Nurse Communication: Mobility status PT Visit Diagnosis: Other abnormalities of gait and mobility (R26.89)    Time: 1702-1730 PT Time Calculation (min) (ACUTE ONLY): 28 min   Charges:   PT Evaluation $PT Eval Moderate Complexity: 1 Mod     PT G Codes:      Mabeline Caras, PT, DPT Acute Rehab Services  Pager: [place] 01/12/2017, 5:55 PM

## 2017-01-12 NOTE — Anesthesia Postprocedure Evaluation (Signed)
Anesthesia Post Note  Patient: Shade Kaley  Procedure(s) Performed: IRRIGATION AND DEBRIDEMENT LEFT KNEE (Left )     Patient location during evaluation: PACU Anesthesia Type: General Level of consciousness: awake and alert Pain management: pain level controlled Vital Signs Assessment: post-procedure vital signs reviewed and stable Respiratory status: spontaneous breathing, nonlabored ventilation, respiratory function stable and patient connected to nasal cannula oxygen Cardiovascular status: blood pressure returned to baseline and stable Postop Assessment: no apparent nausea or vomiting Anesthetic complications: no    Last Vitals:  Vitals:   01/12/17 1215 01/12/17 1235  BP: 111/70 112/66  Pulse: 70 71  Resp: 15 16  Temp:  36.8 C  SpO2: 95% 94%    Last Pain:  Vitals:   01/12/17 1235  TempSrc: Oral  PainSc:                  Saiya Crist DAVID

## 2017-01-12 NOTE — Progress Notes (Signed)
Orthopedic Tech Progress Note Patient Details:  Matthew Kline 07-31-1960 [id number]  Ortho Devices Type of Ortho Device: Knee Immobilizer   Post Interventions Instructions Provided: Care of device   [name] 01/12/2017, 1:23 PM

## 2017-01-12 NOTE — Anesthesia Preprocedure Evaluation (Signed)
Anesthesia Evaluation  Patient identified by MRN, date of birth, ID band Patient awake    Reviewed: Allergy & Precautions, NPO status , Patient's Chart, lab work & pertinent test results  Airway Mallampati: I  TM Distance: >3 FB Neck ROM: Full    Dental   Pulmonary sleep apnea , former smoker,    Pulmonary exam normal        Cardiovascular hypertension, Pt. on medications + CAD and + Past MI  Normal cardiovascular exam     Neuro/Psych Anxiety Depression CVA    GI/Hepatic   Endo/Other  diabetes, Type 2, Insulin Dependent  Renal/GU ESRF and DialysisRenal disease     Musculoskeletal   Abdominal   Peds  Hematology   Anesthesia Other Findings   Reproductive/Obstetrics                             Anesthesia Physical Anesthesia Plan  ASA: III  Anesthesia Plan: General   Post-op Pain Management:    Induction:   PONV Risk Score and Plan: 2 and Ondansetron, Midazolam and Treatment may vary due to age or medical condition  Airway Management Planned: LMA  Additional Equipment:   Intra-op Plan:   Post-operative Plan: Extubation in OR  Informed Consent: I have reviewed the patients History and Physical, chart, labs and discussed the procedure including the risks, benefits and alternatives for the proposed anesthesia with the patient or authorized representative who has indicated his/her understanding and acceptance.     Plan Discussed with: CRNA and Surgeon  Anesthesia Plan Comments:         Anesthesia Quick Evaluation

## 2017-01-12 NOTE — Progress Notes (Signed)
PROGRESS NOTE    Matthew Kline  JIR:[id number] DOB: [birth year] DOA: 01/09/2017 PCP: Sandi Mariscal, MD   Outpatient Specialists:     Brief Narrative:  Matthew Kline is a 57 y.o. male with medical history significant of ESRD on HD(M/W/F), CHF, CAD s/p CABG 05/2016, PAD, CVA, anemia, and chronic ulcer with h/o osteomyelitis of the left foot; who presents after having a fall at home a few days ago after his left knee gave way.  He reports falling onto his side with no loss of consciousness or trauma to his head.  However, thereafter patient noted that he was unable to ambulate due to severe pain in the left foot and knee. He was taken to the OR on 1/2 and found to have an abscess and septic joint per the note from ortho, appears the only option is an AKA.       Assessment & Plan:   Principal Problem:   Fall Active Problems:   Anemia of chronic disease   Leukocytosis   Essential hypertension, benign   Dehiscence of closure of skin   Prosthetic joint infection (HCC)   Pyogenic bacterial arthritis of knee, left (HCC)   End-stage renal disease on hemodialysis (HCC)   CAD, multiple vessel   Peripheral neuropathy   Ulcer of left midfoot with fat layer exposed ([place])   Gait disturbance   Staphylococcus aureus infection   Effusion of left knee   Fall initial encounter, gait disturbance: Acute.   -PT Eval after surgery  Left knee effusion due to acute quadricepts tendon rupture + abscess and septic joint -vanc/zosyn started after culture done in the OR -plan for possible AKA on Friday   Diabetic foot ulcer -plan for AKA  Leukocytosis:  -trending down  ESRD on HD:  -plan for HD today  Essential hypertension - Continue Coreg and amlodipine   Anemia of chronic disease:  -per renal  CAD, PAD: Patient status post CABG in 05/2016 and has had previous endovascular intervention for peripheral artery disease. - Continue Plavix and aspirin  Hyperlipidemia - Continue  atorvastatin  Diabetes mellitus type 2: Patient currently controlled with diet.  Last hemoglobin A1c 6.4 on 11/08/2016. - SSI  Peripheral neuropathy - Continue gabapentin     DVT prophylaxis:  SQ Heparin  Code Status: Full Code   Family Communication: None at bedside  Disposition Plan:     Consultants:   Renal  ortho     Subjective: Sleepy, just back from surgery  Objective: Vitals:   01/12/17 1141 01/12/17 1145 01/12/17 1215 01/12/17 1235  BP: 107/64  111/70 112/66  Pulse: 68 67 70 71  Resp: 14 14 15 16   Temp: 97.6 F (36.4 C)   98.2 F (36.8 C)  TempSrc:    Oral  SpO2: 98% 97% 95% 94%  Weight:      Height:        Intake/Output Summary (Last 24 hours) at 01/12/2017 1453 Last data filed at 01/12/2017 1200 Gross per 24 hour  Intake 510 ml  Output 30 ml  Net 480 ml   Filed Weights   01/11/17 0532 01/11/17 0958 01/12/17 0914  Weight: 88.3 kg (194 lb 10.7 oz) 83 kg (182 lb 15.7 oz) 83 kg (182 lb 15.7 oz)    Examination:  General exam: chronically ill appearing, sleepy still      Data Reviewed: I have personally reviewed following labs and imaging studies  CBC: Recent Labs  Lab 01/09/17 1243 01/10/17 0502 01/11/17 0440  WBC 10.9* 10.0  8.1  NEUTROABS 9.8*  --   --   HGB 10.3* 9.7* 9.8*  HCT 32.7* 31.9* 31.6*  MCV 90.6 91.7 89.3  PLT 162 146* 297   Basic Metabolic Panel: Recent Labs  Lab 01/09/17 1243 01/10/17 0502 01/11/17 0440  NA 133* 134* 132*  K 4.1 5.0 4.9  CL 93* 93* 87*  CO2 27 28 27   GLUCOSE 124* 99 137*  BUN 40* 48* 62*  CREATININE 5.99* 6.83* 8.05*  CALCIUM 8.9 8.9 9.1  PHOS  --   --  5.9*   GFR: Estimated Creatinine Clearance: 10.8 mL/min (A) (by C-G formula based on SCr of 8.05 mg/dL (H)). Liver Function Tests: Recent Labs  Lab 01/11/17 0440  ALBUMIN 2.2*   No results for input(s): LIPASE, AMYLASE in the last 168 hours. No results for input(s): AMMONIA in the last 168 hours. Coagulation Profile: No  results for input(s): INR, PROTIME in the last 168 hours. Cardiac Enzymes: No results for input(s): CKTOTAL, CKMB, CKMBINDEX, TROPONINI in the last 168 hours. BNP (last 3 results) No results for input(s): PROBNP in the last 8760 hours. HbA1C: No results for input(s): HGBA1C in the last 72 hours. CBG: Recent Labs  Lab 01/11/17 1647 01/11/17 2218 01/12/17 0754 01/12/17 0914 01/12/17 1126  GLUCAP 107* 145* 103* 105* 106*   Lipid Profile: No results for input(s): CHOL, HDL, LDLCALC, TRIG, CHOLHDL, LDLDIRECT in the last 72 hours. Thyroid Function Tests: No results for input(s): TSH, T4TOTAL, FREET4, T3FREE, THYROIDAB in the last 72 hours. Anemia Panel: No results for input(s): VITAMINB12, FOLATE, FERRITIN, TIBC, IRON, RETICCTPCT in the last 72 hours. Urine analysis:    Component Value Date/Time   COLORURINE YELLOW 01/03/2012 1335   APPEARANCEUR Clear 07/12/2013 0918   LABSPEC 1.021 01/03/2012 1335   PHURINE 6.0 01/03/2012 1335   GLUCOSEU Trace (A) 07/12/2013 0918   HGBUR TRACE (A) 01/03/2012 1335   BILIRUBINUR Negative 07/12/2013 [place] 01/03/2012 1335   PROTEINUR 3+ (A) 07/12/2013 0918   PROTEINUR >300 (A) 01/03/2012 1335   UROBILINOGEN 0.2 01/03/2012 1335   NITRITE Negative 07/12/2013 0918   NITRITE NEGATIVE 01/03/2012 1335   LEUKOCYTESUR Negative 07/12/2013 0918     ) Recent Results (from the past 240 hour(s))  Aerobic Culture (superficial specimen)     Status: None   Collection Time: 01/06/17 12:32 PM  Result Value Ref Range Status   Specimen Description FOOT LEFT PLANTAR  Final   Special Requests NONE  Final   Gram Stain   Final    NO WBC SEEN ABUNDANT GRAM POSITIVE COCCI IN PAIRS IN CLUSTERS ABUNDANT GRAM NEGATIVE RODS FEW GRAM POSITIVE RODS    Culture   Final    MODERATE STAPHYLOCOCCUS AUREUS WITHIN MIXED ORGANISMS Performed at [facility], [place] [address], [place], [place] [address]    Report Status 01/09/2017 FINAL  Final    Organism ID, Bacteria STAPHYLOCOCCUS AUREUS  Final      Susceptibility   Staphylococcus aureus - MIC*    CIPROFLOXACIN <=0.5 SENSITIVE Sensitive     ERYTHROMYCIN >=8 RESISTANT Resistant     GENTAMICIN <=0.5 SENSITIVE Sensitive     OXACILLIN <=0.25 SENSITIVE Sensitive     TETRACYCLINE <=1 SENSITIVE Sensitive     VANCOMYCIN <=0.5 SENSITIVE Sensitive     TRIMETH/SULFA <=10 SENSITIVE Sensitive     CLINDAMYCIN <=0.25 SENSITIVE Sensitive     RIFAMPIN <=0.5 SENSITIVE Sensitive     Inducible Clindamycin NEGATIVE Sensitive     * MODERATE STAPHYLOCOCCUS  AUREUS  MRSA PCR Screening     Status: None   Collection Time: 01/10/17  8:53 PM  Result Value Ref Range Status   MRSA by PCR NEGATIVE NEGATIVE Final    Comment:        The GeneXpert MRSA Assay (FDA approved for NASAL specimens only), is one component of a comprehensive MRSA colonization surveillance program. It is not intended to diagnose MRSA infection nor to guide or monitor treatment for MRSA infections.   Surgical PCR screen     Status: Abnormal   Collection Time: 01/12/17  3:11 AM  Result Value Ref Range Status   MRSA, PCR NEGATIVE NEGATIVE Final   Staphylococcus aureus POSITIVE (A) NEGATIVE Final    Comment: (NOTE) The Xpert SA Assay (FDA approved for NASAL specimens in patients 58 years of age and older), is one component of a comprehensive surveillance program. It is not intended to diagnose infection nor to guide or monitor treatment.       Anti-infectives (From admission, onward)   Start     Dose/Rate Route Frequency Ordered Stop   01/12/17 1300  vancomycin (VANCOCIN) 2,000 mg in sodium chloride 0.9 % 500 mL IVPB     2,000 mg 250 mL/hr over 120 Minutes Intravenous  Once 01/12/17 1255     01/12/17 1300  piperacillin-tazobactam (ZOSYN) IVPB 3.375 g     3.375 g 12.5 mL/hr over 240 Minutes Intravenous Every 12 hours 01/12/17 1255     01/12/17 0927  ceFAZolin (ANCEF) 2-4 GM/100ML-% IVPB    Comments:  Schonewitz, Leigh    : cabinet override      01/12/17 0927 01/12/17 1001   01/12/17 0707  ceFAZolin (ANCEF) IVPB 2g/100 mL premix     2 g 200 mL/hr over 30 Minutes Intravenous On call to O.R. 01/12/17 0707 01/12/17 1001   01/09/17 2200  clindamycin (CLEOCIN) capsule 300 mg  Status:  Discontinued     300 mg Oral Every 8 hours 01/09/17 2019 01/10/17 1002       Radiology Studies: No results found.      Scheduled Meds: . amLODipine  5 mg Oral QHS  . aspirin EC  162 mg Oral Daily  . atorvastatin  40 mg Oral q1800  . carvedilol  25 mg Oral BID WC  . Chlorhexidine Gluconate Cloth  6 each Topical Daily  . clopidogrel  75 mg Oral Daily  . docusate sodium  100 mg Oral BID  . fentaNYL      . gabapentin  300 mg Oral TID  . heparin  5,000 Units Subcutaneous Q8H  . insulin aspart  0-9 Units Subcutaneous TID WC  . lidocaine (PF)  5 mL Intradermal Once  . montelukast  10 mg Oral QHS  . sevelamer carbonate  2,400-3,200 mg Oral TID WC   Continuous Infusions: . sodium chloride 10 mL/hr at 01/12/17 0929  . sodium chloride    . lactated ringers    . methocarbamol (ROBAXIN)  IV    . piperacillin-tazobactam (ZOSYN)  IV 3.375 g (01/12/17 1322)  . vancomycin       LOS: 2 days    Time spent: 25 min    Geradine Girt, DO [facility] Pager [phone]  If 7PM-7AM, please contact night-coverage [email or web address] Password [facility] 01/12/2017, 2:53 PM

## 2017-01-12 NOTE — Anesthesia Procedure Notes (Signed)
Procedure Name: LMA Insertion Date/Time: 01/12/2017 10:14 AM Performed by: Eligha Bridegroom, CRNA Pre-anesthesia Checklist: Patient identified, Emergency Drugs available, Suction available, Patient being monitored and Timeout performed Patient Re-evaluated:Patient Re-evaluated prior to induction Oxygen Delivery Method: Circle system utilized Preoxygenation: Pre-oxygenation with 100% oxygen Induction Type: IV induction LMA: LMA with gastric port inserted LMA Size: 4.0 Number of attempts: 1 Placement Confirmation: positive ETCO2 and breath sounds checked- equal and bilateral

## 2017-01-12 NOTE — Op Note (Signed)
01/09/2017 - 01/12/2017  10:43 AM  PATIENT:  Matthew Kline    PRE-OPERATIVE DIAGNOSIS:  Left Quadricep Tendon Rupture  POST-OPERATIVE DIAGNOSIS: #1 left quadriceps tendon rupture.  #2 septic left total knee arthroplasty #3 ulceration Charcot collapse left foot.  PROCEDURE:  IRRIGATION AND DEBRIDEMENT LEFT KNEE  SURGEON:  [name], MD  PHYSICIAN ASSISTANT:None ANESTHESIA:   General  PREOPERATIVE INDICATIONS:  Matthew Kline is a  57 y.o. male with a diagnosis of Left Quadricep Tendon Rupture who failed conservative measures and elected for surgical management.    The risks benefits and alternatives were discussed with the patient preoperatively including but not limited to the risks of infection, bleeding, nerve injury, cardiopulmonary complications, the need for revision surgery, among others, and the patient was willing to proceed.  OPERATIVE IMPLANTS: None  OPERATIVE FINDINGS: Septic joint culture sent x2.  OPERATIVE PROCEDURE: Patient was brought to the operating room and underwent a general anesthetic.  After adequate levels of anesthesia were obtained patient's left lower extremity was prepped using DuraPrep draped into a Sterile Field, Charlie Pitter was used to cover all exposed skin.  A timeout was called.  A midline incision was made through his previous midline incision over the quadriceps tendon rupture.  Had a large purulent abscess within the joint cultures were obtained.  The knee was irrigated with pulsatile lavage.  After irrigation and cultures the incision was closed using 2-0 nylon a sterile dressing was applied patient was placed in a knee immobilizer extubated taken the PACU in stable condition  Anticipate patient will require an above-the-knee amputation I will discussed this with the patient tomorrow.  With the Charcot collapse of the left foot ulceration involving the majority of the plantar aspect of his foot and first and second ray amputations with the septic  revision total knee I do not feel there are revision options available.  I feel with the chronic ulceration to the foot with recurrent septic knee the only option would be an above-knee amputation.  Possible surgery on Friday.   DISCHARGE PLANNING:  Antibiotic duration: Patient started on vancomycin and Zosyn as per pharmacy consult.  Weightbearing: As tolerated with knee immobilizer in place.  Pain medication: As required.  Dressing care/ Wound VAC: Maintain dry dressing until follow-up surgery.  Ambulatory devices:knee immobilizer left  Discharge to: anticipate SNF placement  Follow-up: In the office 1 week post operative.

## 2017-01-12 NOTE — Progress Notes (Signed)
Pharmacy Antibiotic Note  Matthew Kline is a 57 y.o. male admitted on 01/09/2017 with septic knee s/p I&D  Plan: Vancomycin 2 g x 1 Zosyn 3.375 gm iv q12h Monitor cx, vanc lvls prn F/U Plans for HD today  Height: 5\' 11"  (180.3 cm) Weight: 182 lb 15.7 oz (83 kg) IBW/kg (Calculated) : 75.3  Temp (24hrs), Avg:98.3 F (36.8 C), Min:97.6 F (36.4 C), Max:99.2 F (37.3 C)  Recent Labs  Lab 01/09/17 1243 01/10/17 0502 01/11/17 0440  WBC 10.9* 10.0 8.1  CREATININE 5.99* 6.83* 8.05*    Estimated Creatinine Clearance: 10.8 mL/min (A) (by C-G formula based on SCr of 8.05 mg/dL (H)).    Allergies  Allergen Reactions  . Morphine And Related Other (See Comments)    hallucinations  . Tygacil [Tigecycline] Nausea And Vomiting and Other (See Comments)    Makes him feel crazy   Levester Fresh, PharmD, BCPS, BCCCP Clinical Pharmacist Clinical phone for 01/12/2017 from 7a-3:30p: [phone] If after 3:30p, please call main pharmacy at: [phone] 01/12/2017 12:56 PM

## 2017-01-12 NOTE — Progress Notes (Signed)
Pharmacy Antibiotic Note  Matthew Kline is a 57 y.o. male admitted on 01/09/2017 with septic knee, now s/p I&D.  Patient has ESRD on MWF schedule as outpatient.  Renal plans for HD on 1/3 and 1/5 and then resume MWF schedule starting 01/17/17.  Plan: Matthew Kline 1gm IV q-HD TTS x 2 doses, then MWF starting 01/18/16 Continue Zosyn EID 3.375gm IV Q12H F/U HD schedule/tolerance, clinical progress   Height: 5\' 11"  (180.3 cm) Weight: 182 lb 15.7 oz (83 kg) IBW/kg (Calculated) : 75.3  Temp (24hrs), Avg:98.1 F (36.7 C), Min:97.6 F (36.4 C), Max:98.4 F (36.9 C)  Recent Labs  Lab 01/09/17 1243 01/10/17 0502 01/11/17 0440  WBC 10.9* 10.0 8.1  CREATININE 5.99* 6.83* 8.05*    Estimated Creatinine Clearance: 10.8 mL/min (A) (by C-G formula based on SCr of 8.05 mg/dL (H)).    Allergies  Allergen Reactions  . Morphine And Related Other (See Comments)    hallucinations  . Tygacil [Tigecycline] Nausea And Vomiting and Other (See Comments)    Makes him feel crazy     Cefazolin 2 g 1/2 x 1 Vancomycin 1/2>> Zosyn 1/2>>  1/2 knee cx   Matthew Kline D. Mina Marble, PharmD, BCPS Pager:  [phone] 01/12/2017, 4:02 PM

## 2017-01-12 NOTE — Transfer of Care (Signed)
Immediate Anesthesia Transfer of Care Note  Patient: Matthew Kline  Procedure(s) Performed: IRRIGATION AND DEBRIDEMENT LEFT KNEE (Left )  Patient Location: PACU  Anesthesia Type:GA combined with regional for post-op pain  Level of Consciousness: awake  Airway & Oxygen Therapy: Patient Spontanous Breathing and Patient connected to face mask oxygen  Post-op Assessment: Report given to RN and Post -op Vital signs reviewed and stable  Post vital signs: Reviewed and stable  Last Vitals:  Vitals:   01/12/17 0949 01/12/17 0950  BP: (!) 143/78 (!) 143/78  Pulse: 80 82  Resp: 15 13  Temp:    SpO2: 98% 96%    Last Pain:  Vitals:   01/12/17 0950  TempSrc:   PainSc: 0-No pain      Patients Stated Pain Goal: 7 (16/10/96 0454)  Complications: No apparent anesthesia complications

## 2017-01-12 NOTE — Anesthesia Procedure Notes (Signed)
Anesthesia Regional Block: Adductor canal block   Pre-Anesthetic Checklist: ,, timeout performed, Correct Patient, Correct Site, Correct Laterality, Correct Procedure, Correct Position, site marked, Risks and benefits discussed,  Surgical consent,  Pre-op evaluation,  At surgeon's request and post-op pain management  Laterality: Left  Prep: chloraprep       Needles:  Injection technique: Single-shot  Needle Type: Echogenic Stimulator Needle     Needle Length: 9cm  Needle Gauge: 21     Additional Needles:   Narrative:  Start time: 01/12/2017 9:35 AM End time: 01/12/2017 9:45 AM Injection made incrementally with aspirations every 5 mL.  Performed by: Personally  Anesthesiologist: Lillia Abed, MD  Additional Notes: Monitors applied. Patient sedated. Sterile prep and drape,hand hygiene and sterile gloves were used. Relevant anatomy identified.Needle position confirmed.Local anesthetic injected incrementally after negative aspiration. Local anesthetic spread visualized around nerve(s). Vascular puncture avoided. No complications. Image printed for medical record.The patient tolerated the procedure well.    Lillia Abed MD

## 2017-01-13 ENCOUNTER — Encounter (HOSPITAL_COMMUNITY): Payer: Self-pay | Admitting: Orthopedic Surgery

## 2017-01-13 ENCOUNTER — Telehealth (INDEPENDENT_AMBULATORY_CARE_PROVIDER_SITE_OTHER): Payer: Self-pay | Admitting: Orthopedic Surgery

## 2017-01-13 ENCOUNTER — Encounter (HOSPITAL_BASED_OUTPATIENT_CLINIC_OR_DEPARTMENT_OTHER): Payer: Medicare HMO | Attending: Internal Medicine

## 2017-01-13 DIAGNOSIS — Y838 Other surgical procedures as the cause of abnormal reaction of the patient, or of later complication, without mention of misadventure at the time of the procedure: Secondary | ICD-10-CM

## 2017-01-13 DIAGNOSIS — L97422 Non-pressure chronic ulcer of left heel and midfoot with fat layer exposed: Secondary | ICD-10-CM

## 2017-01-13 DIAGNOSIS — A4901 Methicillin susceptible Staphylococcus aureus infection, unspecified site: Secondary | ICD-10-CM

## 2017-01-13 DIAGNOSIS — T8454XA Infection and inflammatory reaction due to internal left knee prosthesis, initial encounter: Principal | ICD-10-CM

## 2017-01-13 LAB — RENAL FUNCTION PANEL
Albumin: 2.2 g/dL — ABNORMAL LOW (ref 3.5–5.0)
Anion gap: 12 (ref 5–15)
BUN: 46 mg/dL — ABNORMAL HIGH (ref 6–20)
CHLORIDE: 93 mmol/L — AB (ref 101–111)
CO2: 28 mmol/L (ref 22–32)
CREATININE: 6.58 mg/dL — AB (ref 0.61–1.24)
Calcium: 8.6 mg/dL — ABNORMAL LOW (ref 8.9–10.3)
GFR, EST AFRICAN AMERICAN: 10 mL/min — AB (ref >60)
GFR, EST NON AFRICAN AMERICAN: 8 mL/min — AB (ref >60)
Glucose, Bld: 114 mg/dL — ABNORMAL HIGH (ref 65–99)
POTASSIUM: 4.3 mmol/L (ref 3.5–5.1)
Phosphorus: 5.7 mg/dL — ABNORMAL HIGH (ref 2.5–4.6)
Sodium: 133 mmol/L — ABNORMAL LOW (ref 135–145)

## 2017-01-13 LAB — CBC
HCT: 29.9 % — ABNORMAL LOW (ref 39.0–52.0)
Hemoglobin: 9 g/dL — ABNORMAL LOW (ref 13.0–17.0)
MCH: 27.5 pg (ref 26.0–34.0)
MCHC: 30.1 g/dL (ref 30.0–36.0)
MCV: 91.4 fL (ref 78.0–100.0)
PLATELETS: 171 10*3/uL (ref 150–400)
RBC: 3.27 MIL/uL — ABNORMAL LOW (ref 4.22–5.81)
RDW: 18 % — ABNORMAL HIGH (ref 11.5–15.5)
WBC: 5.8 10*3/uL (ref 4.0–10.5)

## 2017-01-13 LAB — GLUCOSE, CAPILLARY
GLUCOSE-CAPILLARY: 152 mg/dL — AB (ref 65–99)
Glucose-Capillary: 105 mg/dL — ABNORMAL HIGH (ref 65–99)
Glucose-Capillary: 131 mg/dL — ABNORMAL HIGH (ref 65–99)

## 2017-01-13 MED ORDER — FERRIC CITRATE 1 GM 210 MG(FE) PO TABS
420.0000 mg | ORAL_TABLET | Freq: Three times a day (TID) | ORAL | Status: DC
  Administered 2017-01-13 – 2017-01-19 (×13): 420 mg via ORAL
  Filled 2017-01-13 (×23): qty 2

## 2017-01-13 MED ORDER — VANCOMYCIN HCL IN DEXTROSE 1-5 GM/200ML-% IV SOLN
INTRAVENOUS | Status: AC
  Administered 2017-01-13: 1 g via INTRAVENOUS
  Filled 2017-01-13: qty 200

## 2017-01-13 MED ORDER — DOXERCALCIFEROL 4 MCG/2ML IV SOLN
3.0000 ug | INTRAVENOUS | Status: DC
  Administered 2017-01-15: 3 ug via INTRAVENOUS
  Filled 2017-01-13: qty 2

## 2017-01-13 NOTE — Consult Note (Signed)
[facility] for Infectious Disease    Date of Admission:  01/09/2017           Day 2 vancomycin        Day 2 piperacillin tazobactam       Reason for Consult: Staph aureus left prosthetic knee infection and diabetic foot infection    Referring Provider: Dr. Eulogio Bear  Assessment: I suspect that his left prosthetic knee infection is acute rather than a very late relapse of the infection he might of had one year ago.  He also has severe Charcot arthropathy probable deep left foot infection.  He is scheduled for above-the-knee amputation tomorrow.  I will continue vancomycin and piperacillin tazobactam for now.  That will cover both MSSA and MRSA in his knee as well as probable mixed infection in his foot.  Postoperatively we can probably simply focus on treating Staph aureus in case there is residual infection in the femoral stump.  Plan: 1. Continue vancomycin and piperacillin tazobactam 2. I will follow with you  Principal Problem:   Infection of prosthetic left knee joint ([place]) Active Problems:   Ulcer of left midfoot with fat layer exposed ([place])   Staphylococcus aureus infection   Anemia of chronic disease   Peripheral autonomic neuropathy due to DM (HCC)   Leukocytosis   Essential hypertension, benign   DM type 2 causing vascular disease ([place])   Diabetes mellitus with renal manifestations, controlled ([place])   Dehiscence of closure of skin   ESRD on dialysis ([place])   Type II diabetes mellitus (HCC)   Anemia in chronic kidney disease   Pyogenic bacterial arthritis of knee, left (HCC)   End-stage renal disease on hemodialysis (HCC)   CAD, multiple vessel   Peripheral neuropathy   Fall   Gait disturbance   Scheduled Meds: . amLODipine  5 mg Oral QHS  . aspirin EC  162 mg Oral Daily  . atorvastatin  40 mg Oral q1800  . carvedilol  25 mg Oral BID WC  . Chlorhexidine Gluconate Cloth  6 each Topical Daily  . clopidogrel  75 mg Oral Daily  . docusate sodium   100 mg Oral BID  . doxercalciferol  3 mcg Intravenous Q T,Th,Sa-HD  . ferric citrate  420 mg Oral TID WC  . gabapentin  300 mg Oral TID  . heparin  5,000 Units Subcutaneous Q8H  . insulin aspart  0-9 Units Subcutaneous TID WC  . lidocaine (PF)  5 mL Intradermal Once  . montelukast  10 mg Oral QHS   Continuous Infusions: . sodium chloride 10 mL/hr at 01/12/17 0929  . sodium chloride 10 mL/hr at 01/12/17 1559  . lactated ringers    . methocarbamol (ROBAXIN)  IV    . piperacillin-tazobactam (ZOSYN)  IV Stopped (01/13/17 0245)  . vancomycin Stopped (01/13/17 1141)  . [START ON 01/17/2017] vancomycin     PRN Meds:.acetaminophen **OR** acetaminophen, albuterol, bisacodyl, camphor-menthol, HYDROmorphone (DILAUDID) injection, magnesium citrate, methocarbamol **OR** methocarbamol (ROBAXIN)  IV, metoCLOPramide **OR** metoCLOPramide (REGLAN) injection, ondansetron **OR** ondansetron (ZOFRAN) IV, oxyCODONE, oxyCODONE, oxyCODONE-acetaminophen, polyethylene glycol, temazepam  HPI: Matthew Kline is a 57 y.o. male with diabetes, peripheral artery disease, end-stage renal disease and Charcot arthropathy.  He has had a chronic left foot ulcer and has been followed recently at the wound center.  Recent wound drainage cultures on 01/06/2017 grew MSSA.  He recently had a fall and since that time has had severe left knee and left  foot pain that made it impossible for him to walk.  He was admitted on 01/10/2017 because of this.  He was initially felt to have a ruptured quadriceps tendon but when taken to surgery yesterday he was found to have infection of his prosthetic knee.  Initial cultures are growing staph aureus with susceptibilities pending.  He initially underwent left total knee arthroplasty in March 2016.  He had problems with healing and underwent I&D x2 and eventually poly-exchange in May 2016.  He developed signs of joint infection and had excision arthroplasty in July 2016.  Cultures were negative  and he was treated with 6 weeks of IV vancomycin and ceftazidime.  He underwent redo arthroplasty in October 2016.  He had loosening of that prosthesis and underwent a 1 stage revision and year.  Operative Gram stain showed gram-negative rods but cultures were negative.  He was treated with a second 6-week course of IV vancomycin and ceftazidime.  He appears to have done well until his recent fall.   Review of Systems: Review of Systems  Unable to perform ROS: Mental acuity    Past Medical History:  Diagnosis Date  . Anemia, unspecified   . Anxiety   . Arthralgia 2010   polyarticular  . Arthritis    "back, knees" (01/10/2017)  . Cancer [facility])    "kidney area" (01/10/2017)  . CHF (congestive heart failure) ([place]) 07/25/2009   denies  . Chronic lower back pain   . Coronary artery disease   . Coughing    pt. reports that he has drainage from sinus infection  . Diabetic foot ulcer ([place])   . Diabetic neuropathy ([place])   . ESRD (end stage renal disease) on dialysis [facility])    started 12/2012; "MWF; [place] "  (01/10/2017)  . GERD (gastroesophageal reflux disease)    hx "before I lost weight", no problem 9 years  . Hemodialysis access site with mature fistula ([place])   . Hemorrhoids, internal 10/2011   small  . High cholesterol   . History of blood transfusion    "related to the anemia"  . Hypertension   . Insomnia, unspecified   . 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 Long term (current) use of anticoagulants   . Myocardial infarction ([place]) 1995  . Orthostatic hypotension   . Osteomyelitis of foot, left, acute ([place])   . Other chronic postoperative pain   . Pneumonia    "probably twice" (01/10/2017)  . Polymyalgia rheumatica ([place])   . Renal insufficiency   . Sleep apnea    "lost weight; no more problem" (01/10/2017)  . Stroke ([place]) 01/10/06   denies residual on 05/09/2014  . Type II diabetes mellitus ([place]) dx'd 1995  . Unspecified hereditary and idiopathic  peripheral neuropathy    feet  . Unspecified osteomyelitis, site unspecified   . Unspecified vitamin D deficiency     Social History   Tobacco Use  . Smoking status: Former Smoker    Packs/day: 0.12    Years: 32.00    Pack years: 3.84    Types: Cigarettes    Last attempt to quit: 10/05/2016    Years since quitting: 0.2  . Smokeless tobacco: Never Used  Substance Use Topics  . Alcohol use: No    Alcohol/week: 0.0 oz  . Drug use: No    Family History  Problem Relation Age of Onset  . Hypertension Mother   . Cancer Mother 63       Ovarian  .  Heart disease Maternal Aunt   . Stroke Maternal Grandfather    Allergies  Allergen Reactions  . Morphine And Related Other (See Comments)    hallucinations  . Tygacil [Tigecycline] Nausea And Vomiting and Other (See Comments)    Makes him feel crazy    OBJECTIVE: Blood pressure 137/71, pulse 74, temperature 97.6 F (36.4 C), temperature source Oral, resp. rate 12, height 5\' 11"  (1.803 m), weight 182 lb 15.7 oz (83 kg), SpO2 96 %.  Physical Exam  Constitutional:  He is thin and appears frail.  He is lethargic and has difficulty answering questions.  Cardiovascular: Normal rate and regular rhythm.  No murmur heard. Pulmonary/Chest: Effort normal. He has no wheezes. He has no rales.  Abdominal: Soft. He exhibits no distension. There is no tenderness.  Musculoskeletal:  He has had prior amputation of his left great toe and left fourth toe and metatarsal.  His left lower leg is wrapped and in a soft brace.  Neurological: He is alert.    Lab Results Lab Results  Component Value Date   WBC 5.8 01/13/2017   HGB 9.0 (L) 01/13/2017   HCT 29.9 (L) 01/13/2017   MCV 91.4 01/13/2017   PLT 171 01/13/2017    Lab Results  Component Value Date   CREATININE 6.58 (H) 01/13/2017   BUN 46 (H) 01/13/2017   NA 133 (L) 01/13/2017   K 4.3 01/13/2017   CL 93 (L) 01/13/2017   CO2 28 01/13/2017    Lab Results  Component Value Date   ALT  9 (L) 11/08/2016   AST 17 11/08/2016   ALKPHOS 111 11/08/2016   BILITOT 0.8 11/08/2016     Microbiology: Recent Results (from the past 240 hour(s))  Aerobic Culture (superficial specimen)     Status: None   Collection Time: 01/06/17 12:32 PM  Result Value Ref Range Status   Specimen Description FOOT LEFT PLANTAR  Final   Special Requests NONE  Final   Gram Stain   Final    NO WBC SEEN ABUNDANT GRAM POSITIVE COCCI IN PAIRS IN CLUSTERS ABUNDANT GRAM NEGATIVE RODS FEW GRAM POSITIVE RODS    Culture   Final    MODERATE STAPHYLOCOCCUS AUREUS WITHIN MIXED ORGANISMS Performed at [facility], [place] [address], [place], [place] [address]    Report Status 01/09/2017 FINAL  Final   Organism ID, Bacteria STAPHYLOCOCCUS AUREUS  Final      Susceptibility   Staphylococcus aureus - MIC*    CIPROFLOXACIN <=0.5 SENSITIVE Sensitive     ERYTHROMYCIN >=8 RESISTANT Resistant     GENTAMICIN <=0.5 SENSITIVE Sensitive     OXACILLIN <=0.25 SENSITIVE Sensitive     TETRACYCLINE <=1 SENSITIVE Sensitive     VANCOMYCIN <=0.5 SENSITIVE Sensitive     TRIMETH/SULFA <=10 SENSITIVE Sensitive     CLINDAMYCIN <=0.25 SENSITIVE Sensitive     RIFAMPIN <=0.5 SENSITIVE Sensitive     Inducible Clindamycin NEGATIVE Sensitive     * MODERATE STAPHYLOCOCCUS AUREUS  MRSA PCR Screening     Status: None   Collection Time: 01/10/17  8:53 PM  Result Value Ref Range Status   MRSA by PCR NEGATIVE NEGATIVE Final    Comment:        The GeneXpert MRSA Assay (FDA approved for NASAL specimens only), is one component of a comprehensive MRSA colonization surveillance program. It is not intended to diagnose MRSA infection nor to guide or monitor treatment for MRSA infections.   Surgical PCR screen  Status: Abnormal   Collection Time: 01/12/17  3:11 AM  Result Value Ref Range Status   MRSA, PCR NEGATIVE NEGATIVE Final   Staphylococcus aureus POSITIVE (A) NEGATIVE Final    Comment: (NOTE) The Xpert SA Assay (FDA  approved for NASAL specimens in patients 22 years of age and older), is one component of a comprehensive surveillance program. It is not intended to diagnose infection nor to guide or monitor treatment.   Aerobic/Anaerobic Culture (surgical/deep wound)     Status: None (Preliminary result)   Collection Time: 01/12/17 10:43 AM  Result Value Ref Range Status   Specimen Description SYNOVIAL LEFT KNEE  Final   Special Requests SPEC A ON SWABS  Final   Gram Stain   Final    RARE WBC PRESENT, PREDOMINANTLY MONONUCLEAR FEW GRAM POSITIVE COCCI IN PAIRS    Culture   Final    FEW STAPHYLOCOCCUS AUREUS SUSCEPTIBILITIES TO FOLLOW    Report Status PENDING  Incomplete    Michel Bickers, MD [facility] [phone] pager   [phone] cell 01/13/2017, 3:22 PM

## 2017-01-13 NOTE — Progress Notes (Signed)
[name], male   DOB: 11/11/1960, 57 y.o.   MRN: [id number] I came by to have another conversation with the patient regarding the recommendations for proper medical treatment.  Discussed that due to the abscess osteomyelitis ulceration for the diabetic Charcot left foot with the acute abscess of the revision total knee arthroplasty the safest option is to proceed with an above-the-knee amputation on the left.  We will plan for placing antibiotic beads within the femoral canal we will plan on infectious disease consultation for IV antibiotics to cover for the organism within the infected total knee due to the fact that the stem of the femoral component did extend up into the canal of the bone that was retained.  Patient agrees that there are no other safe options he states that he is worried that family will not be supportive.  Patient states that he wants to proceed with the above the knee amputation surgery.  Anticipate surgery around noon tomorrow.

## 2017-01-13 NOTE — Progress Notes (Signed)
PROGRESS NOTE    Matthew Kline  SWH:[id number] DOB: 22-Apr-1960 DOA: 01/09/2017 PCP: Sandi Mariscal, MD   Outpatient Specialists:     Brief Narrative:  Matthew Kline is a 57 y.o. male with medical history significant of ESRD on HD(M/W/F), CHF, CAD s/p CABG 05/2016, PAD, CVA, anemia, and chronic ulcer with h/o osteomyelitis of the left foot; who presents after having a fall at home a few days ago after his left knee gave way.  He reports falling onto his side with no loss of consciousness or trauma to his head.  However, thereafter patient noted that he was unable to ambulate due to severe pain in the left foot and knee. He was taken to the OR on 1/2 and found to have an abscess and septic joint per the note from ortho, appears an AKA is recommended.    Assessment & Plan:   Principal Problem:   Fall Active Problems:   Anemia of chronic disease   Leukocytosis   Essential hypertension, benign   Dehiscence of closure of skin   Prosthetic joint infection (HCC)   Pyogenic bacterial arthritis of knee, left (HCC)   End-stage renal disease on hemodialysis (HCC)   CAD, multiple vessel   Peripheral neuropathy   Ulcer of left midfoot with fat layer exposed ([place])   Gait disturbance   Staphylococcus aureus infection   Fall initial encounter, gait disturbance: Acute.   -PT Eval after surgery  Left knee effusion due to acute quadricepts tendon rupture + abscess and septic joint -vanc/zosyn started after culture done in the OR- gram + cocci on gram stain -plan for possible AKA on Friday but patient has more questions for Dr. Sharol Given -ID consult   Diabetic foot ulcer -plan for AKA  Leukocytosis:  -trending down  ESRD on HD:  -s/p HD today  Essential hypertension - Continue Coreg and amlodipine   Anemia of chronic disease:  -per renal  CAD, PAD: Patient status post CABG in 05/2016 and has had previous endovascular intervention for peripheral artery disease. - Continue  Plavix and aspirin  Hyperlipidemia - Continue atorvastatin  Diabetes mellitus type 2: Patient currently controlled with diet.  Last hemoglobin A1c 6.4 on 11/08/2016. - SSI  Peripheral neuropathy - Continue gabapentin     DVT prophylaxis:  SQ Heparin  Code Status: Full Code   Family Communication: None at bedside  Disposition Plan:  Pending ortho work up   Consultants:   Renal  ortho     Subjective: Sleepy, just back from surgery  Objective: Vitals:   01/13/17 1100 01/13/17 1130 01/13/17 1141 01/13/17 1145  BP: (!) 147/75 123/69 123/69 137/71  Pulse: 75 74 74 74  Resp: 18 12 13 12   Temp:   97.6 F (36.4 C)   TempSrc:   Oral   SpO2:   96%   Weight:   83 kg (182 lb 15.7 oz)   Height:        Intake/Output Summary (Last 24 hours) at 01/13/2017 1519 Last data filed at 01/13/2017 1300 Gross per 24 hour  Intake 450 ml  Output 3000 ml  Net -2550 ml   Filed Weights   01/12/17 0914 01/13/17 0734 01/13/17 1141  Weight: 83 kg (182 lb 15.7 oz) 86 kg (189 lb 9.5 oz) 83 kg (182 lb 15.7 oz)    Examination:  General exam: chronically ill appearing Psych: odd affect- slow to respond at times Heart: rrr      Data Reviewed: I have personally reviewed following labs and  imaging studies  CBC: Recent Labs  Lab 01/09/17 1243 01/10/17 0502 01/11/17 0440 01/13/17 0800  WBC 10.9* 10.0 8.1 5.8  NEUTROABS 9.8*  --   --   --   HGB 10.3* 9.7* 9.8* 9.0*  HCT 32.7* 31.9* 31.6* 29.9*  MCV 90.6 91.7 89.3 91.4  PLT 162 146* 159 967   Basic Metabolic Panel: Recent Labs  Lab 01/09/17 1243 01/10/17 0502 01/11/17 0440 01/13/17 0800  NA 133* 134* 132* 133*  K 4.1 5.0 4.9 4.3  CL 93* 93* 87* 93*  CO2 27 28 27 28   GLUCOSE 124* 99 137* 114*  BUN 40* 48* 62* 46*  CREATININE 5.99* 6.83* 8.05* 6.58*  CALCIUM 8.9 8.9 9.1 8.6*  PHOS  --   --  5.9* 5.7*   GFR: Estimated Creatinine Clearance: 13.2 mL/min (A) (by C-G formula based on SCr of 6.58 mg/dL  (H)). Liver Function Tests: Recent Labs  Lab 01/11/17 0440 01/13/17 0800  ALBUMIN 2.2* 2.2*   No results for input(s): LIPASE, AMYLASE in the last 168 hours. No results for input(s): AMMONIA in the last 168 hours. Coagulation Profile: No results for input(s): INR, PROTIME in the last 168 hours. Cardiac Enzymes: No results for input(s): CKTOTAL, CKMB, CKMBINDEX, TROPONINI in the last 168 hours. BNP (last 3 results) No results for input(s): PROBNP in the last 8760 hours. HbA1C: No results for input(s): HGBA1C in the last 72 hours. CBG: Recent Labs  Lab 01/12/17 0914 01/12/17 1126 01/12/17 1616 01/12/17 2133 01/13/17 1212  GLUCAP 105* 106* 100* 136* 105*   Lipid Profile: No results for input(s): CHOL, HDL, LDLCALC, TRIG, CHOLHDL, LDLDIRECT in the last 72 hours. Thyroid Function Tests: No results for input(s): TSH, T4TOTAL, FREET4, T3FREE, THYROIDAB in the last 72 hours. Anemia Panel: No results for input(s): VITAMINB12, FOLATE, FERRITIN, TIBC, IRON, RETICCTPCT in the last 72 hours. Urine analysis:    Component Value Date/Time   COLORURINE YELLOW 01/03/2012 1335   APPEARANCEUR Clear 07/12/2013 0918   LABSPEC 1.021 01/03/2012 1335   PHURINE 6.0 01/03/2012 1335   GLUCOSEU Trace (A) 07/12/2013 0918   HGBUR TRACE (A) 01/03/2012 1335   BILIRUBINUR Negative 07/12/2013 [place] 01/03/2012 1335   PROTEINUR 3+ (A) 07/12/2013 0918   PROTEINUR >300 (A) 01/03/2012 1335   UROBILINOGEN 0.2 01/03/2012 1335   NITRITE Negative 07/12/2013 0918   NITRITE NEGATIVE 01/03/2012 1335   LEUKOCYTESUR Negative 07/12/2013 0918     ) Recent Results (from the past 240 hour(s))  Aerobic Culture (superficial specimen)     Status: None   Collection Time: 01/06/17 12:32 PM  Result Value Ref Range Status   Specimen Description FOOT LEFT PLANTAR  Final   Special Requests NONE  Final   Gram Stain   Final    NO WBC SEEN ABUNDANT GRAM POSITIVE COCCI IN PAIRS IN  CLUSTERS ABUNDANT GRAM NEGATIVE RODS FEW GRAM POSITIVE RODS    Culture   Final    MODERATE STAPHYLOCOCCUS AUREUS WITHIN MIXED ORGANISMS Performed at [facility], [place] [address], [place], [place] [address]    Report Status 01/09/2017 FINAL  Final   Organism ID, Bacteria STAPHYLOCOCCUS AUREUS  Final      Susceptibility   Staphylococcus aureus - MIC*    CIPROFLOXACIN <=0.5 SENSITIVE Sensitive     ERYTHROMYCIN >=8 RESISTANT Resistant     GENTAMICIN <=0.5 SENSITIVE Sensitive     OXACILLIN <=0.25 SENSITIVE Sensitive     TETRACYCLINE <=1 SENSITIVE Sensitive     VANCOMYCIN <=  0.5 SENSITIVE Sensitive     TRIMETH/SULFA <=10 SENSITIVE Sensitive     CLINDAMYCIN <=0.25 SENSITIVE Sensitive     RIFAMPIN <=0.5 SENSITIVE Sensitive     Inducible Clindamycin NEGATIVE Sensitive     * MODERATE STAPHYLOCOCCUS AUREUS  MRSA PCR Screening     Status: None   Collection Time: 01/10/17  8:53 PM  Result Value Ref Range Status   MRSA by PCR NEGATIVE NEGATIVE Final    Comment:        The GeneXpert MRSA Assay (FDA approved for NASAL specimens only), is one component of a comprehensive MRSA colonization surveillance program. It is not intended to diagnose MRSA infection nor to guide or monitor treatment for MRSA infections.   Surgical PCR screen     Status: Abnormal   Collection Time: 01/12/17  3:11 AM  Result Value Ref Range Status   MRSA, PCR NEGATIVE NEGATIVE Final   Staphylococcus aureus POSITIVE (A) NEGATIVE Final    Comment: (NOTE) The Xpert SA Assay (FDA approved for NASAL specimens in patients 75 years of age and older), is one component of a comprehensive surveillance program. It is not intended to diagnose infection nor to guide or monitor treatment.   Aerobic/Anaerobic Culture (surgical/deep wound)     Status: None (Preliminary result)   Collection Time: 01/12/17 10:43 AM  Result Value Ref Range Status   Specimen Description SYNOVIAL LEFT KNEE  Final   Special Requests SPEC A ON  SWABS  Final   Gram Stain   Final    RARE WBC PRESENT, PREDOMINANTLY MONONUCLEAR FEW GRAM POSITIVE COCCI IN PAIRS    Culture   Final    FEW STAPHYLOCOCCUS AUREUS SUSCEPTIBILITIES TO FOLLOW    Report Status PENDING  Incomplete      Anti-infectives (From admission, onward)   Start     Dose/Rate Route Frequency Ordered Stop   01/17/17 1200  vancomycin (VANCOCIN) IVPB 1000 mg/200 mL premix     1,000 mg 200 mL/hr over 60 Minutes Intravenous Every M-W-F (Hemodialysis) 01/12/17 1600     01/13/17 1200  vancomycin (VANCOCIN) IVPB 1000 mg/200 mL premix     1,000 mg 200 mL/hr over 60 Minutes Intravenous Every T-Th-Sa (Hemodialysis) 01/12/17 1600 01/18/17 1159   01/12/17 1300  vancomycin (VANCOCIN) 2,000 mg in sodium chloride 0.9 % 500 mL IVPB     2,000 mg 250 mL/hr over 120 Minutes Intravenous  Once 01/12/17 1255 01/12/17 1656   01/12/17 1300  piperacillin-tazobactam (ZOSYN) IVPB 3.375 g     3.375 g 12.5 mL/hr over 240 Minutes Intravenous Every 12 hours 01/12/17 1255     01/12/17 0927  ceFAZolin (ANCEF) 2-4 GM/100ML-% IVPB    Comments:  Schonewitz, Leigh   : cabinet override      01/12/17 0927 01/12/17 1001   01/12/17 0707  ceFAZolin (ANCEF) IVPB 2g/100 mL premix     2 g 200 mL/hr over 30 Minutes Intravenous On call to O.R. 01/12/17 0707 01/12/17 1001   01/09/17 2200  clindamycin (CLEOCIN) capsule 300 mg  Status:  Discontinued     300 mg Oral Every 8 hours 01/09/17 2019 01/10/17 1002       Radiology Studies: No results found.      Scheduled Meds: . amLODipine  5 mg Oral QHS  . aspirin EC  162 mg Oral Daily  . atorvastatin  40 mg Oral q1800  . carvedilol  25 mg Oral BID WC  . Chlorhexidine Gluconate Cloth  6 each Topical Daily  . clopidogrel  75 mg Oral Daily  . docusate sodium  100 mg Oral BID  . doxercalciferol  3 mcg Intravenous Q T,Th,Sa-HD  . ferric citrate  420 mg Oral TID WC  . gabapentin  300 mg Oral TID  . heparin  5,000 Units Subcutaneous Q8H  . insulin aspart   0-9 Units Subcutaneous TID WC  . lidocaine (PF)  5 mL Intradermal Once  . montelukast  10 mg Oral QHS   Continuous Infusions: . sodium chloride 10 mL/hr at 01/12/17 0929  . sodium chloride 10 mL/hr at 01/12/17 1559  . lactated ringers    . methocarbamol (ROBAXIN)  IV    . piperacillin-tazobactam (ZOSYN)  IV Stopped (01/13/17 0245)  . vancomycin Stopped (01/13/17 1141)  . [START ON 01/17/2017] vancomycin       LOS: 3 days    Time spent: 25 min    Geradine Girt, DO [facility] Pager [phone]  If 7PM-7AM, please contact night-coverage [email or web address] Password [facility] 01/13/2017, 3:19 PM

## 2017-01-13 NOTE — Telephone Encounter (Signed)
Please before the end of the day can you call this pt to discuss surgery tomorrow. Pt's wife states they are confused as to why he is needing an amputation.

## 2017-01-13 NOTE — Progress Notes (Signed)
[name], male   DOB: 01/27/1960, 57 y.o.   MRN: [id number] Patient is a 57 year old gentleman postoperative day 1 for abscess revision left total knee arthroplasty, quad tendon rupture, Charcot collapse of the left foot status post first and second ray amputations, with a large ulcer with exposed bone from the Charcot arthropathy.  Gram stains, interoperative, from the knee are positive for gram-positive cocci, PCR was positive for staph aureus, negative for MRSA.  Plan for a above-the-knee amputation tomorrow.  Patient does have a stemmed femoral component and will need regional antibiotic beads and most likely will need IV antibiotics postoperatively. Recommend consult infectious disease, for IV antibitics due to the residual femur being exposed to the infection.  Will send bone for cultures as well.

## 2017-01-13 NOTE — Plan of Care (Signed)
  Education: Knowledge of General Education information will improve 01/13/2017 0503 - Progressing by Anson Fret, RN Note POC reviewed with pt.

## 2017-01-13 NOTE — Progress Notes (Signed)
Consent signed.

## 2017-01-13 NOTE — Progress Notes (Signed)
Hemo called with report, removed 3 liters of fluid, pt alert and orientated waiting for lunch to come with no complains.  Vanco given in hemodialysis

## 2017-01-13 NOTE — Telephone Encounter (Signed)
I called patient's wife and discussed the conversation with the patient this morning.  Discussed that with the quadriceps tendon rupture the massive septic joint with the revised total knee arthroplasty and the Charcot rocker-bottom deformity with ulceration osteomyelitis and abscess to the foot does not have any safe limb salvage options.  Feel the patient could progress quickly with an above-the-knee amputation be fit with a prosthesis.  Patient's wife states that she feels that this is the appropriate treatment as well.  I will return to the patient's room this evening to rediscuss the recommendations for surgery tomorrow.  I have told the wife that surgery should be around 1230 tomorrow.

## 2017-01-13 NOTE — H&P (View-Only) (Signed)
[name], male   DOB: Apr 06, 1960, 57 y.o.   MRN: [id number] I came by to have another conversation with the patient regarding the recommendations for proper medical treatment.  Discussed that due to the abscess osteomyelitis ulceration for the diabetic Charcot left foot with the acute abscess of the revision total knee arthroplasty the safest option is to proceed with an above-the-knee amputation on the left.  We will plan for placing antibiotic beads within the femoral canal we will plan on infectious disease consultation for IV antibiotics to cover for the organism within the infected total knee due to the fact that the stem of the femoral component did extend up into the canal of the bone that was retained.  Patient agrees that there are no other safe options he states that he is worried that family will not be supportive.  Patient states that he wants to proceed with the above the knee amputation surgery.  Anticipate surgery around noon tomorrow.

## 2017-01-13 NOTE — Procedures (Signed)
Tol HD today. No hemodyanamic instability. Goal 3500, BP 106/52 Using LUE AV access. Mild myoclonus is present Matthew Kline

## 2017-01-13 NOTE — Progress Notes (Addendum)
PT Cancellation Note  Patient Details Name: Matthew Kline MRN: [id number] DOB: 07/03/1960   Cancelled Treatment:    Reason Eval/Treat Not Completed: Other (comment) Patient refusing PT citing he wishes to wait until his surgery tomorrow. Expresses concern that he is unaware of the exact plan and is noticeably distressed. Per charting, MD will be visiting with patient this evening to rediscuss. PT plan to re-evaluate after sx and update d/c recommendations.   Reinaldo Berber, PT, DPT Acute Rehab Services Pager: [phone]     Reinaldo Berber 01/13/2017, 5:00 PM

## 2017-01-13 NOTE — Telephone Encounter (Signed)
Patients wife called very concerned about her husbands surgery, states that on the orders for surgery Dr. Sharol Given suggested amputation and the patient is confused as to why he's needing it amputated and would like to speak with someone about this. CB # [phone]

## 2017-01-14 ENCOUNTER — Encounter (HOSPITAL_COMMUNITY)
Admission: EM | Disposition: A | Discharge status: Skilled Nursing Facility | Payer: Self-pay | Source: Home / Self Care | Attending: Internal Medicine

## 2017-01-14 ENCOUNTER — Inpatient Hospital Stay (HOSPITAL_COMMUNITY): Payer: Medicare HMO | Admitting: Certified Registered"

## 2017-01-14 ENCOUNTER — Encounter (HOSPITAL_COMMUNITY): Payer: Self-pay

## 2017-01-14 DIAGNOSIS — D638 Anemia in other chronic diseases classified elsewhere: Secondary | ICD-10-CM

## 2017-01-14 DIAGNOSIS — I2510 Atherosclerotic heart disease of native coronary artery without angina pectoris: Secondary | ICD-10-CM

## 2017-01-14 HISTORY — PX: AMPUTATION: SHX166

## 2017-01-14 LAB — GLUCOSE, CAPILLARY
GLUCOSE-CAPILLARY: 126 mg/dL — AB (ref 65–99)
GLUCOSE-CAPILLARY: 116 mg/dL — AB (ref 65–99)
GLUCOSE-CAPILLARY: 130 mg/dL — AB (ref 65–99)
Glucose-Capillary: 133 mg/dL — ABNORMAL HIGH (ref 65–99)

## 2017-01-14 LAB — TYPE AND SCREEN
ABO/RH(D): O NEG
ANTIBODY SCREEN: NEGATIVE

## 2017-01-14 SURGERY — AMPUTATION, ABOVE KNEE
Anesthesia: General | Laterality: Left

## 2017-01-14 MED ORDER — DARBEPOETIN ALFA 200 MCG/0.4ML IJ SOSY
200.0000 ug | PREFILLED_SYRINGE | INTRAMUSCULAR | Status: DC
  Administered 2017-01-19: 200 ug via INTRAVENOUS
  Filled 2017-01-14: qty 0.4

## 2017-01-14 MED ORDER — SUCCINYLCHOLINE CHLORIDE 200 MG/10ML IV SOSY
PREFILLED_SYRINGE | INTRAVENOUS | Status: AC
  Filled 2017-01-14: qty 10

## 2017-01-14 MED ORDER — CEFAZOLIN SODIUM-DEXTROSE 2-4 GM/100ML-% IV SOLN
2.0000 g | INTRAVENOUS | Status: DC
  Administered 2017-01-15: 2 g via INTRAVENOUS
  Filled 2017-01-14 (×3): qty 100

## 2017-01-14 MED ORDER — ONDANSETRON HCL 4 MG/2ML IJ SOLN
INTRAMUSCULAR | Status: DC | PRN
  Administered 2017-01-14: 4 mg via INTRAVENOUS

## 2017-01-14 MED ORDER — OXYCODONE HCL 5 MG/5ML PO SOLN: 5.0000 mg | Freq: Once | ORAL | Status: DC | PRN

## 2017-01-14 MED ORDER — HYDROMORPHONE HCL 1 MG/ML IJ SOLN
1.0000 mg | INTRAMUSCULAR | Status: DC | PRN
  Administered 2017-01-14 – 2017-01-19 (×5): 1 mg via INTRAVENOUS
  Filled 2017-01-14 (×6): qty 1

## 2017-01-14 MED ORDER — FENTANYL CITRATE (PF) 250 MCG/5ML IJ SOLN
INTRAMUSCULAR | Status: AC
Start: 2017-01-14 — End: 2017-01-14
  Filled 2017-01-14: qty 5

## 2017-01-14 MED ORDER — METOCLOPRAMIDE HCL 5 MG PO TABS: 5.0000 mg | ORAL_TABLET | Freq: Three times a day (TID) | ORAL | Status: DC | PRN

## 2017-01-14 MED ORDER — ROCURONIUM BROMIDE 10 MG/ML (PF) SYRINGE
PREFILLED_SYRINGE | INTRAVENOUS | Status: AC
  Filled 2017-01-14: qty 5

## 2017-01-14 MED ORDER — DOCUSATE SODIUM 100 MG PO CAPS
100.0000 mg | ORAL_CAPSULE | Freq: Two times a day (BID) | ORAL | Status: DC
  Administered 2017-01-14 – 2017-01-19 (×10): 100 mg via ORAL
  Filled 2017-01-14 (×8): qty 1

## 2017-01-14 MED ORDER — ONDANSETRON HCL 4 MG PO TABS: 4.0000 mg | ORAL_TABLET | Freq: Four times a day (QID) | ORAL | Status: DC | PRN

## 2017-01-14 MED ORDER — LIDOCAINE 2% (20 MG/ML) 5 ML SYRINGE
INTRAMUSCULAR | Status: AC
  Filled 2017-01-14: qty 5

## 2017-01-14 MED ORDER — CHLORHEXIDINE GLUCONATE CLOTH 2 % EX PADS
6.0000 | MEDICATED_PAD | Freq: Every day | CUTANEOUS | Status: AC
  Administered 2017-01-14 – 2017-01-18 (×5): 6 via TOPICAL

## 2017-01-14 MED ORDER — SODIUM CHLORIDE 0.9 % IV SOLN: INTRAVENOUS | Status: DC

## 2017-01-14 MED ORDER — GENTAMICIN SULFATE 40 MG/ML IJ SOLN
INTRAMUSCULAR | Status: AC
  Filled 2017-01-14: qty 4

## 2017-01-14 MED ORDER — DEXAMETHASONE SODIUM PHOSPHATE 10 MG/ML IJ SOLN
INTRAMUSCULAR | Status: AC
  Filled 2017-01-14: qty 1

## 2017-01-14 MED ORDER — PHENYLEPHRINE 40 MCG/ML (10ML) SYRINGE FOR IV PUSH (FOR BLOOD PRESSURE SUPPORT)
PREFILLED_SYRINGE | INTRAVENOUS | Status: AC
  Filled 2017-01-14: qty 10

## 2017-01-14 MED ORDER — ACETAMINOPHEN 650 MG RE SUPP: 650.0000 mg | RECTAL | Status: DC | PRN

## 2017-01-14 MED ORDER — HYDROMORPHONE HCL 1 MG/ML IJ SOLN
0.2500 mg | INTRAMUSCULAR | Status: DC | PRN
  Administered 2017-01-14: 0.25 mg via INTRAVENOUS

## 2017-01-14 MED ORDER — VANCOMYCIN HCL 1000 MG IV SOLR
INTRAVENOUS | Status: AC
  Filled 2017-01-14: qty 1000

## 2017-01-14 MED ORDER — MIDAZOLAM HCL 2 MG/2ML IJ SOLN
INTRAMUSCULAR | Status: AC
  Filled 2017-01-14: qty 2

## 2017-01-14 MED ORDER — EPHEDRINE SULFATE-NACL 50-0.9 MG/10ML-% IV SOSY
PREFILLED_SYRINGE | INTRAVENOUS | Status: DC | PRN
  Administered 2017-01-14: 5 mg via INTRAVENOUS
  Administered 2017-01-14 (×3): 10 mg via INTRAVENOUS

## 2017-01-14 MED ORDER — MUPIROCIN 2 % EX OINT
1.0000 "application " | TOPICAL_OINTMENT | Freq: Two times a day (BID) | CUTANEOUS | Status: AC
  Administered 2017-01-14 – 2017-01-18 (×10): 1 via NASAL
  Filled 2017-01-14 (×2): qty 22

## 2017-01-14 MED ORDER — OXYCODONE HCL 5 MG PO TABS: 5.0000 mg | ORAL_TABLET | ORAL | Status: DC | PRN

## 2017-01-14 MED ORDER — VASOPRESSIN 20 UNIT/ML IV SOLN
INTRAVENOUS | Status: DC | PRN
  Administered 2017-01-14 (×2): 1 [IU] via INTRAVENOUS

## 2017-01-14 MED ORDER — METOCLOPRAMIDE HCL 5 MG/ML IJ SOLN: 5.0000 mg | Freq: Three times a day (TID) | INTRAMUSCULAR | Status: DC | PRN

## 2017-01-14 MED ORDER — ONDANSETRON HCL 4 MG/2ML IJ SOLN: 4.0000 mg | Freq: Four times a day (QID) | INTRAMUSCULAR | Status: DC | PRN

## 2017-01-14 MED ORDER — POLYETHYLENE GLYCOL 3350 17 G PO PACK
17.0000 g | PACK | Freq: Every day | ORAL | Status: DC | PRN
Start: 2017-01-14 — End: 2017-01-19

## 2017-01-14 MED ORDER — SODIUM CHLORIDE 0.9 % IV SOLN
INTRAVENOUS | Status: DC
Start: 2017-01-14 — End: 2017-01-14
  Administered 2017-01-14: 11:00:00 via INTRAVENOUS

## 2017-01-14 MED ORDER — OXYCODONE HCL 5 MG PO TABS
10.0000 mg | ORAL_TABLET | ORAL | Status: DC | PRN
  Administered 2017-01-14 – 2017-01-19 (×11): 10 mg via ORAL
  Filled 2017-01-14 (×10): qty 2

## 2017-01-14 MED ORDER — CHLORHEXIDINE GLUCONATE 4 % EX LIQD
60.0000 mL | Freq: Once | CUTANEOUS | Status: AC
  Administered 2017-01-14: 4 via TOPICAL

## 2017-01-14 MED ORDER — HYDROMORPHONE HCL 1 MG/ML IJ SOLN
INTRAMUSCULAR | Status: AC
  Administered 2017-01-14: 0.25 mg via INTRAVENOUS
  Filled 2017-01-14: qty 1

## 2017-01-14 MED ORDER — PHENYLEPHRINE 40 MCG/ML (10ML) SYRINGE FOR IV PUSH (FOR BLOOD PRESSURE SUPPORT)
PREFILLED_SYRINGE | INTRAVENOUS | Status: DC | PRN
Start: 2017-01-14 — End: 2017-01-14
  Administered 2017-01-14 (×3): 80 ug via INTRAVENOUS
  Administered 2017-01-14: 120 ug via INTRAVENOUS
  Administered 2017-01-14 (×2): 80 ug via INTRAVENOUS

## 2017-01-14 MED ORDER — MIDAZOLAM HCL 2 MG/2ML IJ SOLN
INTRAMUSCULAR | Status: DC | PRN
  Administered 2017-01-14: 2 mg via INTRAVENOUS

## 2017-01-14 MED ORDER — EPHEDRINE 5 MG/ML INJ
INTRAVENOUS | Status: AC
  Filled 2017-01-14: qty 10

## 2017-01-14 MED ORDER — FENTANYL CITRATE (PF) 100 MCG/2ML IJ SOLN
INTRAMUSCULAR | Status: DC | PRN
  Administered 2017-01-14 (×2): 50 ug via INTRAVENOUS

## 2017-01-14 MED ORDER — OXYCODONE HCL 5 MG PO TABS: 5.0000 mg | ORAL_TABLET | Freq: Once | ORAL | Status: DC | PRN

## 2017-01-14 MED ORDER — FENTANYL CITRATE (PF) 100 MCG/2ML IJ SOLN: 25.0000 ug | INTRAMUSCULAR | Status: DC | PRN

## 2017-01-14 MED ORDER — METHOCARBAMOL 1000 MG/10ML IJ SOLN
500.0000 mg | Freq: Four times a day (QID) | INTRAMUSCULAR | Status: DC | PRN
  Filled 2017-01-14: qty 5

## 2017-01-14 MED ORDER — ONDANSETRON HCL 4 MG/2ML IJ SOLN
INTRAMUSCULAR | Status: AC
  Filled 2017-01-14: qty 2

## 2017-01-14 MED ORDER — PROPOFOL 10 MG/ML IV BOLUS
INTRAVENOUS | Status: AC
  Filled 2017-01-14: qty 20

## 2017-01-14 MED ORDER — METHOCARBAMOL 500 MG PO TABS
500.0000 mg | ORAL_TABLET | Freq: Four times a day (QID) | ORAL | Status: DC | PRN
  Administered 2017-01-16 – 2017-01-18 (×3): 500 mg via ORAL
  Filled 2017-01-14 (×3): qty 1

## 2017-01-14 MED ORDER — ACETAMINOPHEN 325 MG PO TABS
650.0000 mg | ORAL_TABLET | ORAL | Status: DC | PRN
  Administered 2017-01-15 – 2017-01-17 (×3): 650 mg via ORAL
  Filled 2017-01-14 (×4): qty 2

## 2017-01-14 MED ORDER — SODIUM CHLORIDE 0.9 % IV SOLN: Freq: Once | INTRAVENOUS | Status: DC

## 2017-01-14 MED ORDER — BISACODYL 10 MG RE SUPP: 10.0000 mg | Freq: Every day | RECTAL | Status: DC | PRN

## 2017-01-14 MED ORDER — PROPOFOL 10 MG/ML IV BOLUS
INTRAVENOUS | Status: DC | PRN
  Administered 2017-01-14: 20 mg via INTRAVENOUS
  Administered 2017-01-14: 70 mg via INTRAVENOUS

## 2017-01-14 MED ORDER — MAGNESIUM CITRATE PO SOLN: 1.0000 | Freq: Once | ORAL | Status: DC | PRN

## 2017-01-14 MED ORDER — FENTANYL CITRATE (PF) 250 MCG/5ML IJ SOLN
INTRAMUSCULAR | Status: AC
  Filled 2017-01-14: qty 5

## 2017-01-14 MED ORDER — PHENYLEPHRINE HCL 10 MG/ML IJ SOLN
INTRAMUSCULAR | Status: DC | PRN
  Administered 2017-01-14: 80 ug/min via INTRAVENOUS

## 2017-01-14 MED ORDER — VASOPRESSIN 20 UNIT/ML IV SOLN
INTRAVENOUS | Status: AC
  Filled 2017-01-14: qty 1

## 2017-01-14 SURGICAL SUPPLY — 44 items
BLADE SAW RECIP 87.9 MT (BLADE) ×3 IMPLANT
BNDG COHESIVE 6X5 TAN STRL LF (GAUZE/BANDAGES/DRESSINGS) IMPLANT
BNDG GAUZE ELAST 4 BULKY (GAUZE/BANDAGES/DRESSINGS) IMPLANT
CANISTER WOUND CARE 500ML ATS (WOUND CARE) IMPLANT
COVER MAYO STAND STRL (DRAPES) ×3 IMPLANT
COVER SURGICAL LIGHT HANDLE (MISCELLANEOUS) ×6 IMPLANT
CUFF TOURNIQUET SINGLE 34IN LL (TOURNIQUET CUFF) IMPLANT
CUFF TOURNIQUET SINGLE 44IN (TOURNIQUET CUFF) IMPLANT
DRAIN PENROSE 1/2X12 LTX STRL (WOUND CARE) IMPLANT
DRAPE INCISE IOBAN 66X45 STRL (DRAPES) ×6 IMPLANT
DRAPE U-SHAPE 47X51 STRL (DRAPES) ×3 IMPLANT
DRSG ADAPTIC 3X8 NADH LF (GAUZE/BANDAGES/DRESSINGS) IMPLANT
DRSG PAD ABDOMINAL 8X10 ST (GAUZE/BANDAGES/DRESSINGS) IMPLANT
DURAPREP 26ML APPLICATOR (WOUND CARE) ×3 IMPLANT
ELECT CAUTERY BLADE 6.4 (BLADE) IMPLANT
ELECT REM PT RETURN 9FT ADLT (ELECTROSURGICAL) ×3
ELECTRODE REM PT RTRN 9FT ADLT (ELECTROSURGICAL) ×1 IMPLANT
EVACUATOR 1/8 PVC DRAIN (DRAIN) IMPLANT
GAUZE SPONGE 4X4 12PLY STRL (GAUZE/BANDAGES/DRESSINGS) ×3 IMPLANT
GLOVE BIOGEL PI IND STRL 9 (GLOVE) ×1 IMPLANT
GLOVE BIOGEL PI INDICATOR 9 (GLOVE) ×2
GLOVE SURG ORTHO 9.0 STRL STRW (GLOVE) ×3 IMPLANT
GOWN STRL REUS W/ TWL XL LVL3 (GOWN DISPOSABLE) ×2 IMPLANT
GOWN STRL REUS W/TWL XL LVL3 (GOWN DISPOSABLE) ×4
KIT BASIN OR (CUSTOM PROCEDURE TRAY) ×3 IMPLANT
KIT ROOM TURNOVER OR (KITS) ×3 IMPLANT
KIT STIMULAN RAPID CURE  10CC (Orthopedic Implant) ×2 IMPLANT
KIT STIMULAN RAPID CURE 10CC (Orthopedic Implant) ×1 IMPLANT
MANIFOLD NEPTUNE II (INSTRUMENTS) ×3 IMPLANT
NS IRRIG 1000ML POUR BTL (IV SOLUTION) ×3 IMPLANT
PACK ORTHO EXTREMITY (CUSTOM PROCEDURE TRAY) ×3 IMPLANT
PAD ARMBOARD 7.5X6 YLW CONV (MISCELLANEOUS) ×3 IMPLANT
STAPLER VISISTAT 35W (STAPLE) ×3 IMPLANT
STOCKINETTE IMPERVIOUS LG (DRAPES) IMPLANT
SUT ETHILON 2 0 PSLX (SUTURE) ×6 IMPLANT
SUT PDS AB 1 CT  36 (SUTURE)
SUT PDS AB 1 CT 36 (SUTURE) IMPLANT
SUT VIC AB 1 CTX 27 (SUTURE) ×3 IMPLANT
TOWEL OR 17X26 10 PK STRL BLUE (TOWEL DISPOSABLE) ×3 IMPLANT
TUBE CONNECTING 20'X1/4 (TUBING) ×1
TUBE CONNECTING 20X1/4 (TUBING) ×2 IMPLANT
WATER STERILE IRR 1000ML POUR (IV SOLUTION) ×3 IMPLANT
WND VAC CANISTER 500ML (MISCELLANEOUS) ×3 IMPLANT
YANKAUER SUCT BULB TIP NO VENT (SUCTIONS) ×3 IMPLANT

## 2017-01-14 NOTE — Progress Notes (Signed)
PT Cancellation Note  Patient Details Name: Matthew Kline MRN: [id number] DOB: 05-12-1960   Cancelled Treatment:    Reason Eval/Treat Not Completed: Fatigue/lethargy limiting ability to participate Attempted to evaluate patient at 1640. Patient is in and out of sleep and can not participate in therapy session at this time due to sedation. Will evaluate 1/05 AM.   Reinaldo Berber, PT, DPT Acute Rehab Services Pager: [phone]     Reinaldo Berber 01/14/2017, 4:45 PM

## 2017-01-14 NOTE — OR Nursing (Signed)
Handoff report called to [place].  Matthew Kline is arousable to voice and resting comfortably after Dilaudid for pain. When awake he is oriented to person, place and situation. VAC dressing intact and at 125 mmHg for suction.

## 2017-01-14 NOTE — Progress Notes (Signed)
Pharmacy Antibiotic Note  Matthew Kline is a 57 y.o. male admitted on 01/09/2017 with a septic knee infection. Patient is s/p AKA today  Pharmacy has been consulted for cefazolin dosing given MSSA in knee culture. Patient is afebrile and WBC WNL. Patient is ESRD with normal dialysis days MWF. Patient is off schedule while in the hospital and plan for next dialysis session tomorrow (1/5).  Plan: Cefazolin 2 gm after each HD session  Monitor HD schedule, length of antibiotics post-amputation  Height: 5\' 11"  (180.3 cm) Weight: 182 lb 15.7 oz (83 kg) IBW/kg (Calculated) : 75.3  Temp (24hrs), Avg:97.9 F (36.6 C), Min:97.5 F (36.4 C), Max:98.2 F (36.8 C)  Recent Labs  Lab 01/09/17 1243 01/10/17 0502 01/11/17 0440 01/13/17 0800  WBC 10.9* 10.0 8.1 5.8  CREATININE 5.99* 6.83* 8.05* 6.58*    Estimated Creatinine Clearance: 13.2 mL/min (A) (by C-G formula based on SCr of 6.58 mg/dL (H)).    Allergies  Allergen Reactions  . Morphine And Related Other (See Comments)    hallucinations  . Tygacil [Tigecycline] Nausea And Vomiting and Other (See Comments)    Makes him feel crazy    Antimicrobials this admission: Cefazolin 2 g 1/2 x 1, 1/4>> Vancomycin 1/2 >>1/4 Zosyn 1/2 >>1/4    Microbiology results: 1/2 Wound cx (L-knee) >> MSSA 12/31 MRSA PCR >> neg  Thank you for allowing pharmacy to be a part of this patient's care.  Susa Raring, PharmD, BCPS PGY2 Infectious Diseases Pharmacy Resident Pager: [phone]  01/14/2017 4:00 PM

## 2017-01-14 NOTE — Op Note (Signed)
01/14/2017  1:23 PM  PATIENT:  Matthew Kline    PRE-OPERATIVE DIAGNOSIS:  Abscess left knee, with septic total knee arthroplasty revision, osteomyelitis left foot with Charcot rocker-bottom deformity and ulceration left foot  POST-OPERATIVE DIAGNOSIS:  Same  PROCEDURE:  AMPUTATION ABOVE LEFT KNEE Show a well defined but he is is a 12 distraction I came in and saw Matthew Kline yesterday in the morning solicited he had a septic total knee this is second time the foot that supposed out he does have the leg and he will get a new leg is cold and so the nurse called me because he says you can apply an antibiotic spacer and there might be Bicillin by last night and told to get against mode switches but I just did not want to do that there is 3  SURGEON:  [name], MD  PHYSICIAN ASSISTANT:None ANESTHESIA:   General  PREOPERATIVE INDICATIONS:  Matthew Kline is a  57 y.o. male with a diagnosis of Abscess left knee who failed conservative measures and elected for surgical management.    The risks benefits and alternatives were discussed with the patient preoperatively including but not limited to the risks of infection, bleeding, nerve injury, cardiopulmonary complications, the need for revision surgery, among others, and the patient was willing to proceed.  OPERATIVE IMPLANTS: Antibiotic beads 10 cc with 1 g vancomycin and 160 mg gentamicin.  OPERATIVE FINDINGS: No abscess at the surgical site no signs of bone infection.  Patient was brought the operating room and underwent a general anesthetic.  After adequate levels of anesthesia were obtained patient's left lower extremity  OPERATIVE PROCEDURE: Was prepped using DuraPrep draped into a sterile field a timeout was called.  A fishmouth incision was made just proximal to patient's previous knee incision.  This was carried down to the bone electrocautery was used for hemostasis.  The vascular bundles medially were clamped with a Kelly clamp.  The  leg was amputated through the femur approximately 1/2 cm proximal to the intramedullary aspect of the femoral total knee arthroplasty.  There is no signs of bone infection at this site.  The amputation was completed the vascular bundles were suture ligated with 2-0 silk hemostasis was obtained.  The canal was packed with 10 cc of the antibiotic beads mixed with vancomycin and gentamicin.  The deep and superficial fascial layers and skin was closed using 2-0 nylon.  A Praveena wound VAC was applied patient was extubated taken to the PACU.   DISCHARGE PLANNING:  Antibiotic duration: As per infectious disease recommendations.  Cultures were obtained intraoperatively from the abscess of the knee.  Weightbearing: Nonweightbearing left lower extremity   Pain medication: as needed  Dressing care/ Wound VAC: Praveena wound VAC for 1 week.  Ambulatory devices: Walker.  Discharge to: Anticipate discharge to skilled nursing.  Follow-up: In the office 1 week post operative.

## 2017-01-14 NOTE — Progress Notes (Addendum)
Initial Nutrition Assessment  DOCUMENTATION CODES:   Not applicable  INTERVENTION:    Advance diet as medically appropriate   RD to add supplements when/as able  NUTRITION DIAGNOSIS:   Increased nutrient needs related to post-op healing as evidenced by estimated needs  GOAL:   Patient will meet greater than or equal to 90% of their needs  MONITOR:   Diet advancement, PO intake, Supplement acceptance, Labs, Weight trends, Skin  REASON FOR ASSESSMENT:   Consult Poor PO  ASSESSMENT:   57 y.o. Male with medical history significant of ESRD on HD(M/W/F), CHF, CAD s/p CABG 05/2016, PAD, CVA, anemia, and chronic ulcer with h/o osteomyelitis of the left foot; who presented after having a fall at home a few days ago after his left knee gave way.  Pt admitted with septic knee infection. S/P irrigation & debridement of left knee 1/2. He is currently in OR for left AKA.  Prior to NPO status, pt was on a Renal/Carbohydrate Modified diet. PO intake was variable at 30-50% per flowsheet records. Labs and medications reviewed. CBG's [id number].  NUTRITION - FOCUSED PHYSICAL EXAM:    Most Recent Value  Orbital Region  Unable to assess  Upper Arm Region  Unable to assess  Thoracic and Lumbar Region  Unable to assess  Buccal Region  Unable to assess  Temple Region  Unable to assess  Clavicle Bone Region  Unable to assess  Clavicle and Acromion Bone Region  Unable to assess  Scapular Bone Region  Unable to assess  Dorsal Hand  Unable to assess  Patellar Region  Unable to assess  Anterior Thigh Region  Unable to assess  Posterior Calf Region  Unable to assess  Edema (RD Assessment)  Unable to assess     Diet Order:  Diet renal with fluid restriction Fluid restriction: 1200 mL Fluid; Room service appropriate? Yes; Fluid consistency: Thin Diet NPO time specified  EDUCATION NEEDS:   No education needs have been identified at this time  Skin:  Skin Assessment: Skin Integrity  Issues: Skin Integrity Issues:: Other (Comment) Other: L prosthetic knee infection and diabetic foot infection         Last BM:  12/31  Height:   Ht Readings from Last 1 Encounters:  01/12/17 5\' 11"  (1.803 m)   Weight:   Wt Readings from Last 1 Encounters:  01/13/17 182 lb 15.7 oz (83 kg)   Wt Readings from Last 10 Encounters:  01/13/17 182 lb 15.7 oz (83 kg)  11/08/16 190 lb 7.6 oz (86.4 kg)  09/07/16 203 lb (92.1 kg)  08/25/16 190 lb 11.2 oz (86.5 kg)  08/17/16 199 lb 3.2 oz (90.4 kg)  07/14/16 191 lb 12.8 oz (87 kg)  06/24/16 199 lb 11.8 oz (90.6 kg)  01/15/16 196 lb (88.9 kg)  12/16/15 196 lb (88.9 kg)  12/09/15 196 lb (88.9 kg)   Ideal Body Weight:  78.1 kg  BMI:  Body mass index is 25.52 kg/m.  Estimated Nutritional Needs:   Kcal:  2200-2400  Protein:  110-125 gm  Fluid:  2.2-2.4 L  Arthur Holms, RD, LDN Pager #: [phone] After-Hours Pager #: [phone]

## 2017-01-14 NOTE — Progress Notes (Signed)
PROGRESS NOTE    Matthew Kline  SWF:[id number] DOB: 11/28/1960 DOA: 01/09/2017 PCP: Sandi Mariscal, MD   Outpatient Specialists:     Brief Narrative:  Matthew Kline is a 57 y.o. male with medical history significant of ESRD on HD(M/W/F), CHF, CAD s/p CABG 05/2016, PAD, CVA, anemia, and chronic ulcer with h/o osteomyelitis of the left foot; severe Charcots deformity of L foot presents after having a fall at home a few days ago after his left knee gave way.   He reports falling onto his side with no loss of consciousness or trauma to his head.  However, thereafter patient noted that he was unable to ambulate due to severe pain in the left foot and knee. He was taken to the OR on 1/2 and found to have an abscess and septic joint per Ortho,  AKA is recommended.  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   -PT Eval after surgery  ESRD on HD:  -renal following, status post hemodialysis yesterday -stable  Essential hypertension - Continue Coreg, hold amlodipine   Anemia of chronic disease:  -Aranesp per renal  CAD, PAD:  -status post CABG in 05/2016 and has had previous endovascular intervention for peripheral artery disease. - Continue Plavix and aspirin -stable  Hyperlipidemia - Continue atorvastatin  Diabetes mellitus type 2: - Patient currently controlled with diet.  Last hemoglobin A1c 6.4 on 11/08/2016. - SSI  Peripheral neuropathy - Continue gabapentin  DVT prophylaxis: SQ Heparin  Code Status:Full Code Family Communication:wife at bedside Disposition Plan: pending stability postop, may need rehabilitation  Consultants:    Renal  ortho     Subjective: -waiting for surgery, denies any pain or swelling at this time -No chest pain or shortness of breath  Objective: Vitals:   01/14/17 1345 01/14/17 1400 01/14/17 1413 01/14/17 1415  BP: 109/63 113/69  112/64  Pulse: (!) 58 60 64 63  Resp: 17 17 11 16   Temp:      TempSrc:      SpO2: 94% 97% 96% 96%  Weight:      Height:        Intake/Output Summary (Last 24 hours) at 01/14/2017 1429 Last data filed at 01/14/2017 1333 Gross per 24 hour  Intake 400 ml  Output -  Net 400 ml   Filed Weights   01/12/17 0914 01/13/17 0734 01/13/17 1141  Weight: 83 kg (182 lb 15.7 oz) 86 kg (189 lb 9.5 oz) 83 kg (182 lb 15.7 oz)    Examination:  Gen: Awake, Alert, Oriented X 3, chronically ill debilitated male HEENT: PERRLA, Neck supple, no JVD Lungs: decreased breath sounds at bases, rest clear CVS: S1/S2, regular rate rhythm Abd: soft, Non tender, non distended, BS present Extremities: left foot with severe Charcot deformity, tightly wrapped compressive dressing all the way down to his toes, brace over this Skin: no new rashes    Data Reviewed: I have personally reviewed following labs and imaging studies  CBC: Recent Labs  Lab 01/09/17 1243 01/10/17 0502 01/11/17 0440 01/13/17 0800  WBC 10.9* 10.0 8.1 5.8  NEUTROABS 9.8*  --   --   --   HGB 10.3* 9.7* 9.8* 9.0*  HCT 32.7* 31.9* 31.6* 29.9*  MCV  90.6 91.7 89.3 91.4  PLT 162 146* 159 417   Basic Metabolic Panel: Recent Labs  Lab 01/09/17 1243 01/10/17 0502 01/11/17 0440 01/13/17 0800  NA 133* 134* 132* 133*  K 4.1 5.0 4.9 4.3  CL 93* 93* 87* 93*  CO2 27 28 27 28   GLUCOSE 124* 99 137* 114*  BUN 40* 48* 62* 46*  CREATININE 5.99* 6.83* 8.05* 6.58*  CALCIUM 8.9 8.9 9.1 8.6*  PHOS  --   --  5.9* 5.7*   GFR: Estimated Creatinine Clearance: 13.2 mL/min (A) (by C-G formula based on SCr of 6.58 mg/dL (H)). Liver Function Tests: Recent Labs  Lab 01/11/17 0440 01/13/17 0800  ALBUMIN 2.2*  2.2*   No results for input(s): LIPASE, AMYLASE in the last 168 hours. No results for input(s): AMMONIA in the last 168 hours. Coagulation Profile: No results for input(s): INR, PROTIME in the last 168 hours. Cardiac Enzymes: No results for input(s): CKTOTAL, CKMB, CKMBINDEX, TROPONINI in the last 168 hours. BNP (last 3 results) No results for input(s): PROBNP in the last 8760 hours. HbA1C: No results for input(s): HGBA1C in the last 72 hours. CBG: Recent Labs  Lab 01/13/17 1212 01/13/17 1650 01/13/17 2119 01/14/17 0646 01/14/17 1411  GLUCAP 105* 131* 152* 126* 133*   Lipid Profile: No results for input(s): CHOL, HDL, LDLCALC, TRIG, CHOLHDL, LDLDIRECT in the last 72 hours. Thyroid Function Tests: No results for input(s): TSH, T4TOTAL, FREET4, T3FREE, THYROIDAB in the last 72 hours. Anemia Panel: No results for input(s): VITAMINB12, FOLATE, FERRITIN, TIBC, IRON, RETICCTPCT in the last 72 hours. Urine analysis:    Component Value Date/Time   COLORURINE YELLOW 01/03/2012 1335   APPEARANCEUR Clear 07/12/2013 0918   LABSPEC 1.021 01/03/2012 1335   PHURINE 6.0 01/03/2012 1335   GLUCOSEU Trace (A) 07/12/2013 0918   HGBUR TRACE (A) 01/03/2012 1335   BILIRUBINUR Negative 07/12/2013 [place] 01/03/2012 1335   PROTEINUR 3+ (A) 07/12/2013 0918   PROTEINUR >300 (A) 01/03/2012 1335   UROBILINOGEN 0.2 01/03/2012 1335   NITRITE Negative 07/12/2013 0918   NITRITE NEGATIVE 01/03/2012 1335   LEUKOCYTESUR Negative 07/12/2013 0918     ) Recent Results (from the past 240 hour(s))  Aerobic Culture (superficial specimen)     Status: None   Collection Time: 01/06/17 12:32 PM  Result Value Ref Range Status   Specimen Description FOOT LEFT PLANTAR  Final   Special Requests NONE  Final   Gram Stain   Final    NO WBC SEEN ABUNDANT GRAM POSITIVE COCCI IN PAIRS IN CLUSTERS ABUNDANT GRAM NEGATIVE RODS FEW GRAM POSITIVE RODS    Culture   Final    MODERATE STAPHYLOCOCCUS  AUREUS WITHIN MIXED ORGANISMS Performed at [facility], [place] [address] [place], [place] [address]    Report Status 01/09/2017 FINAL  Final   Organism ID, Bacteria STAPHYLOCOCCUS AUREUS  Final      Susceptibility   Staphylococcus aureus - MIC*    CIPROFLOXACIN <=0.5 SENSITIVE Sensitive     ERYTHROMYCIN >=8 RESISTANT Resistant     GENTAMICIN <=0.5 SENSITIVE Sensitive     OXACILLIN <=0.25 SENSITIVE Sensitive     TETRACYCLINE <=1 SENSITIVE Sensitive     VANCOMYCIN <=0.5 SENSITIVE Sensitive     TRIMETH/SULFA <=10 SENSITIVE Sensitive     CLINDAMYCIN <=0.25 SENSITIVE Sensitive     RIFAMPIN <=0.5 SENSITIVE Sensitive     Inducible Clindamycin NEGATIVE Sensitive     * MODERATE STAPHYLOCOCCUS AUREUS  MRSA PCR Screening  Status: None   Collection Time: 01/10/17  8:53 PM  Result Value Ref Range Status   MRSA by PCR NEGATIVE NEGATIVE Final    Comment:        The GeneXpert MRSA Assay (FDA approved for NASAL specimens only), is one component of a comprehensive MRSA colonization surveillance program. It is not intended to diagnose MRSA infection nor to guide or monitor treatment for MRSA infections.   Surgical PCR screen     Status: Abnormal   Collection Time: 01/12/17  3:11 AM  Result Value Ref Range Status   MRSA, PCR NEGATIVE NEGATIVE Final   Staphylococcus aureus POSITIVE (A) NEGATIVE Final    Comment: (NOTE) The Xpert SA Assay (FDA approved for NASAL specimens in patients 35 years of age and older), is one component of a comprehensive surveillance program. It is not intended to diagnose infection nor to guide or monitor treatment.   Aerobic/Anaerobic Culture (surgical/deep wound)     Status: None (Preliminary result)   Collection Time: 01/12/17 10:43 AM  Result Value Ref Range Status   Specimen Description SYNOVIAL LEFT KNEE  Final   Special Requests SPEC A ON SWABS  Final   Gram Stain   Final    RARE WBC PRESENT, PREDOMINANTLY MONONUCLEAR FEW GRAM POSITIVE COCCI IN  PAIRS    Culture   Final    FEW STAPHYLOCOCCUS AUREUS NO ANAEROBES ISOLATED; CULTURE IN PROGRESS FOR 5 DAYS    Report Status PENDING  Incomplete   Organism ID, Bacteria STAPHYLOCOCCUS AUREUS  Final      Susceptibility   Staphylococcus aureus - MIC*    CIPROFLOXACIN <=0.5 SENSITIVE Sensitive     ERYTHROMYCIN >=8 RESISTANT Resistant     GENTAMICIN <=0.5 SENSITIVE Sensitive     OXACILLIN 0.5 SENSITIVE Sensitive     TETRACYCLINE <=1 SENSITIVE Sensitive     VANCOMYCIN 1 SENSITIVE Sensitive     TRIMETH/SULFA <=10 SENSITIVE Sensitive     CLINDAMYCIN <=0.25 SENSITIVE Sensitive     RIFAMPIN <=0.5 SENSITIVE Sensitive     Inducible Clindamycin NEGATIVE Sensitive     * FEW STAPHYLOCOCCUS AUREUS  Fungus Culture With Stain     Status: None (Preliminary result)   Collection Time: 01/12/17 10:43 AM  Result Value Ref Range Status   Fungus Stain Final report  Final    Comment: (NOTE) Performed At: [facility] [place], [place] [id number] Rush Farmer MD [id number]    Fungus (Mycology) Culture PENDING  Incomplete   Fungal Source SYNOVIAL  Final    Comment: LEFT KNEE   Fungus Culture Result     Status: None   Collection Time: 01/12/17 10:43 AM  Result Value Ref Range Status   Result 1 Comment  Final    Comment: (NOTE) KOH/Calcofluor preparation:  no fungus observed. Performed At: [facility] [address] [place], [place] [id number] Rush Farmer MD XB:[id number]       Anti-infectives (From admission, onward)   Start     Dose/Rate Route Frequency Ordered Stop   01/17/17 1200  [MAR Hold]  vancomycin (VANCOCIN) IVPB 1000 mg/200 mL premix     (MAR Hold since 01/14/17 1143)   1,000 mg 200 mL/hr over 60 Minutes Intravenous Every M-W-F (Hemodialysis) 01/12/17 1600     01/13/17 1200  [MAR Hold]  vancomycin (VANCOCIN) IVPB 1000 mg/200 mL premix     (MAR Hold since 01/14/17 1143)   1,000 mg 200 mL/hr over 60 Minutes Intravenous Every T-Th-Sa  (Hemodialysis) 01/12/17 1600 01/18/17 1159  01/12/17 1300  vancomycin (VANCOCIN) 2,000 mg in sodium chloride 0.9 % 500 mL IVPB     2,000 mg 250 mL/hr over 120 Minutes Intravenous  Once 01/12/17 1255 01/12/17 1656   01/12/17 1300  [MAR Hold]  piperacillin-tazobactam (ZOSYN) IVPB 3.375 g     (MAR Hold since 01/14/17 1143)   3.375 g 12.5 mL/hr over 240 Minutes Intravenous Every 12 hours 01/12/17 1255     01/12/17 0927  ceFAZolin (ANCEF) 2-4 GM/100ML-% IVPB    Comments:  Schonewitz, Leigh   : cabinet override      01/12/17 0927 01/12/17 1001   01/12/17 0707  ceFAZolin (ANCEF) IVPB 2g/100 mL premix     2 g 200 mL/hr over 30 Minutes Intravenous On call to O.R. 01/12/17 0707 01/12/17 1001   01/09/17 2200  clindamycin (CLEOCIN) capsule 300 mg  Status:  Discontinued     300 mg Oral Every 8 hours 01/09/17 2019 01/10/17 1002       Radiology Studies: No results found.      Scheduled Meds: . [MAR Hold] amLODipine  5 mg Oral QHS  . [MAR Hold] aspirin EC  162 mg Oral Daily  . [MAR Hold] atorvastatin  40 mg Oral q1800  . [MAR Hold] carvedilol  25 mg Oral BID WC  . [MAR Hold] Chlorhexidine Gluconate Cloth  6 each Topical Daily  . [MAR Hold] clopidogrel  75 mg Oral Daily  . [MAR Hold] docusate sodium  100 mg Oral BID  . [MAR Hold] doxercalciferol  3 mcg Intravenous Q T,Th,Sa-HD  . [MAR Hold] ferric citrate  420 mg Oral TID WC  . [MAR Hold] gabapentin  300 mg Oral TID  . [MAR Hold] heparin  5,000 Units Subcutaneous Q8H  . [MAR Hold] insulin aspart  0-9 Units Subcutaneous TID WC  . [MAR Hold] lidocaine (PF)  5 mL Intradermal Once  . [MAR Hold] montelukast  10 mg Oral QHS  . [MAR Hold] mupirocin ointment  1 application Nasal BID   Continuous Infusions: . sodium chloride 10 mL/hr at 01/12/17 0929  . sodium chloride 10 mL/hr at 01/12/17 1559  . [MAR Hold] sodium chloride    . sodium chloride 10 mL/hr at 01/14/17 1105  . lactated ringers    . [MAR Hold] methocarbamol (ROBAXIN)  IV    .  [MAR Hold] piperacillin-tazobactam (ZOSYN)  IV 0 g (01/14/17 0223)  . [MAR Hold] vancomycin Stopped (01/13/17 1141)  . [MAR Hold] vancomycin       LOS: 4 days    Time spent: 35 min    Domenic Polite, MD [facility] Page via [email or web address] password [email or web address]  If 7PM-7AM, please contact night-coverage  01/14/2017, 2:29 PM

## 2017-01-14 NOTE — Progress Notes (Signed)
Patient stated that he has a few contacts of people he would like to be contacted if needed and with updates.   Wife: Onix Jumper [phone] Lesleigh Noe: [phone]

## 2017-01-14 NOTE — Progress Notes (Signed)
         [facility] for Infectious Disease    Date of Admission:  01/09/2017   Day 3 vancomycin        Day 3 piperacillin tazobactam  Mr. Hearn underwent above-knee amputation today.  Dr. Jess Barters operative note states that there was no sign of bone infection at the site of the amputation.  Given that, I do not think that he will need a long course of postoperative antibiotics.  Recent cultures from his prosthetic knee have grown MSSA.  I will change him to cefazolin and follow-up early next week.         Michel Bickers, MD [facility] for Infectious [place] [facility] [phone] pager   [phone] cell 01/14/2017, 2:33 PM

## 2017-01-14 NOTE — Anesthesia Preprocedure Evaluation (Signed)
Anesthesia Evaluation  Patient identified by MRN, date of birth, ID band Patient awake    Reviewed: Allergy & Precautions, NPO status , Patient's Chart, lab work & pertinent test results  History of Anesthesia Complications Negative for: history of anesthetic complications  Airway Mallampati: I  TM Distance: >3 FB Neck ROM: Full    Dental  (+) Teeth Intact   Pulmonary shortness of breath, sleep apnea , former smoker,    breath sounds clear to auscultation       Cardiovascular hypertension, Pt. on medications + CAD, + Past MI, + Peripheral Vascular Disease and +CHF   Rhythm:Regular     Neuro/Psych PSYCHIATRIC DISORDERS Anxiety Depression  Neuromuscular disease CVA    GI/Hepatic GERD  ,  Endo/Other  diabetes, Type 2, Insulin Dependent  Renal/GU ESRF and DialysisRenal disease     Musculoskeletal  (+) Arthritis , Chronic left leg infection    Abdominal   Peds  Hematology  (+) anemia ,   Anesthesia Other Findings   Reproductive/Obstetrics                             Anesthesia Physical Anesthesia Plan  ASA: III  Anesthesia Plan: General   Post-op Pain Management:    Induction: Intravenous  PONV Risk Score and Plan: 2 and Ondansetron and Treatment may vary due to age or medical condition  Airway Management Planned: LMA  Additional Equipment: None  Intra-op Plan:   Post-operative Plan: Extubation in OR  Informed Consent: I have reviewed the patients History and Physical, chart, labs and discussed the procedure including the risks, benefits and alternatives for the proposed anesthesia with the patient or authorized representative who has indicated his/her understanding and acceptance.   Dental advisory given  Plan Discussed with: CRNA and Surgeon  Anesthesia Plan Comments:         Anesthesia Quick Evaluation

## 2017-01-14 NOTE — Transfer of Care (Signed)
Immediate Anesthesia Transfer of Care Note  Patient: Matthew Kline  Procedure(s) Performed: AMPUTATION ABOVE LEFT KNEE (Left )  Patient Location: PACU  Anesthesia Type:General  Level of Consciousness: unresponsive and drowsy  Airway & Oxygen Therapy: Patient Spontanous Breathing and Patient connected to face mask oxygen  Post-op Assessment: Report given to RN and Post -op Vital signs reviewed and stable  Post vital signs: Reviewed and stable  Last Vitals:  Vitals:   01/14/17 0513 01/14/17 1330  BP: 108/66 96/69  Pulse: 70 (!) 55  Resp: 16 18  Temp: (!) 36.4 C 36.7 C  SpO2: 92% 96%    Last Pain:  Vitals:   01/14/17 1330  TempSrc:   PainSc: (P) 0-No pain      Patients Stated Pain Goal: 0 (14/23/20 0941)  Complications: No apparent anesthesia complications

## 2017-01-14 NOTE — Progress Notes (Signed)
[facility] Progress Note   Subjective:  Seen in PACU s/p L AKA this morning. Wakes slightly, but overall still very sedated. Looks comfortable.   Objective Vitals:   01/14/17 1345 01/14/17 1400 01/14/17 1413 01/14/17 1415  BP: 109/63 113/69  112/64  Pulse: (!) 58 60 64 63  Resp: 17 17 11 16   Temp:      TempSrc:      SpO2: 94% 97% 96% 96%  Weight:      Height:       Physical Exam General: Well appearing man. NAD. Sleepy from anesthesia. Heart: RRR; no murmur Lungs: CTA anteriorly Extremities: Trace RLE edema, L AKA bandaged  Additional Objective Labs: Basic Metabolic Panel: Recent Labs  Lab 01/10/17 0502 01/11/17 0440 01/13/17 0800  NA 134* 132* 133*  K 5.0 4.9 4.3  CL 93* 87* 93*  CO2 28 27 28   GLUCOSE 99 137* 114*  BUN 48* 62* 46*  CREATININE 6.83* 8.05* 6.58*  CALCIUM 8.9 9.1 8.6*  PHOS  --  5.9* 5.7*   Liver Function Tests: Recent Labs  Lab 01/11/17 0440 01/13/17 0800  ALBUMIN 2.2* 2.2*   CBC: Recent Labs  Lab 01/09/17 1243 01/10/17 0502 01/11/17 0440 01/13/17 0800  WBC 10.9* 10.0 8.1 5.8  NEUTROABS 9.8*  --   --   --   HGB 10.3* 9.7* 9.8* 9.0*  HCT 32.7* 31.9* 31.6* 29.9*  MCV 90.6 91.7 89.3 91.4  PLT 162 146* 159 171   CBG: Recent Labs  Lab 01/13/17 1212 01/13/17 1650 01/13/17 2119 01/14/17 0646 01/14/17 1411  GLUCAP 105* 131* 152* 126* 133*   Medications: . sodium chloride 10 mL/hr at 01/12/17 0929  . sodium chloride 10 mL/hr at 01/12/17 1559  . [MAR Hold] sodium chloride    . sodium chloride 10 mL/hr at 01/14/17 1105  . lactated ringers    . [MAR Hold] methocarbamol (ROBAXIN)  IV    . [MAR Hold] piperacillin-tazobactam (ZOSYN)  IV 0 g (01/14/17 0223)  . [MAR Hold] vancomycin Stopped (01/13/17 1141)  . [MAR Hold] vancomycin     . [MAR Hold] amLODipine  5 mg Oral QHS  . [MAR Hold] aspirin EC  162 mg Oral Daily  . [MAR Hold] atorvastatin  40 mg Oral q1800  . [MAR Hold] carvedilol  25 mg Oral BID WC  . [MAR  Hold] Chlorhexidine Gluconate Cloth  6 each Topical Daily  . [MAR Hold] clopidogrel  75 mg Oral Daily  . [MAR Hold] docusate sodium  100 mg Oral BID  . [MAR Hold] doxercalciferol  3 mcg Intravenous Q T,Th,Sa-HD  . [MAR Hold] ferric citrate  420 mg Oral TID WC  . [MAR Hold] gabapentin  300 mg Oral TID  . [MAR Hold] heparin  5,000 Units Subcutaneous Q8H  . [MAR Hold] insulin aspart  0-9 Units Subcutaneous TID WC  . [MAR Hold] lidocaine (PF)  5 mL Intradermal Once  . [MAR Hold] montelukast  10 mg Oral QHS  . [MAR Hold] mupirocin ointment  1 application Nasal BID    Dialysis Orders: MWF at [facility] 4hr, BFR 400, DFR 800, EDW 84.5kg, 2K/2Ca, AVF, heparin 4000 bolus - Mircera 248mcg IV q 2 weeks (last given 12/26) - Hectoral 86mcg (just ^'d from 37mcg) IV q HD  Assessment/Plan: 1. L knee quad tendon rupture: S/p fall. 2.  Septic L knee: Hx infected L foot and L septic knee with Hx TKR (seeded hardware). Vanc/Zosyn started. 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 3. ESRD: Usually  MWF schedule, off schedule here. Next HD 1/5, then back to MWF sched. 4. Hypertension/volume: BP controlled, no edema. Weight will be lower s/p amputation. 5. Anemia: Hgb 9. Will dose Aranesp with HD tomorrow. 6. Metabolic bone disease: Ca ok, Phos ^ slightly. Continue binders Lorin Picket) and VDRA. 7. CAD 8. Type 2 DM 9.  Nutrition: Alb very low, continue supps.    Veneta Penton, PA-C 01/14/2017, 2:28 PM  [facility] Pager: [phone]  Renal Attending: I agree with note as articulated above. Estanislado Emms

## 2017-01-14 NOTE — Anesthesia Procedure Notes (Signed)
Procedure Name: LMA Insertion Date/Time: 01/14/2017 12:32 PM Performed by: Orlie Dakin, CRNA Pre-anesthesia Checklist: Patient identified, Emergency Drugs available, Suction available, Patient being monitored and Timeout performed Patient Re-evaluated:Patient Re-evaluated prior to induction Oxygen Delivery Method: Circle system utilized Preoxygenation: Pre-oxygenation with 100% oxygen Induction Type: IV induction Ventilation: Mask ventilation without difficulty LMA: LMA inserted LMA Size: 4.0 Tube type: Oral Number of attempts: 1 Placement Confirmation: positive ETCO2 and breath sounds checked- equal and bilateral Tube secured with: Tape Dental Injury: Teeth and Oropharynx as per pre-operative assessment  Comments: Noted poor condition of lower front teeth, many worn down, broken off, none loose per patient report.

## 2017-01-14 NOTE — Interval H&P Note (Signed)
History and Physical Interval Note:  01/14/2017 7:07 AM  Matthew Kline  has presented today for surgery, with the diagnosis of Abscess left knee  The various methods of treatment have been discussed with the patient and family. After consideration of risks, benefits and other options for treatment, the patient has consented to  Procedure(s): AMPUTATION ABOVE LEFT KNEE (Left) as a surgical intervention .  The patient's history has been reviewed, patient examined, no change in status, stable for surgery.  I have reviewed the patient's chart and labs.  Questions were answered to the patient's satisfaction.     [name]

## 2017-01-15 LAB — BASIC METABOLIC PANEL
Anion gap: 13 (ref 5–15)
BUN: 33 mg/dL — AB (ref 6–20)
CALCIUM: 9.6 mg/dL (ref 8.9–10.3)
CO2: 28 mmol/L (ref 22–32)
CREATININE: 5.68 mg/dL — AB (ref 0.61–1.24)
Chloride: 94 mmol/L — ABNORMAL LOW (ref 101–111)
GFR calc Af Amer: 12 mL/min — ABNORMAL LOW (ref >60)
GFR, EST NON AFRICAN AMERICAN: 10 mL/min — AB (ref >60)
Glucose, Bld: 111 mg/dL — ABNORMAL HIGH (ref 65–99)
Potassium: 4.1 mmol/L (ref 3.5–5.1)
Sodium: 135 mmol/L (ref 135–145)

## 2017-01-15 LAB — GLUCOSE, CAPILLARY
Glucose-Capillary: 109 mg/dL — ABNORMAL HIGH (ref 65–99)
Glucose-Capillary: 125 mg/dL — ABNORMAL HIGH (ref 65–99)
Glucose-Capillary: 114 mg/dL — ABNORMAL HIGH (ref 65–99)
Glucose-Capillary: 118 mg/dL — ABNORMAL HIGH (ref 65–99)
Glucose-Capillary: 133 mg/dL — ABNORMAL HIGH (ref 65–99)

## 2017-01-15 LAB — CBC
HCT: 31.5 % — ABNORMAL LOW (ref 39.0–52.0)
Hemoglobin: 9.3 g/dL — ABNORMAL LOW (ref 13.0–17.0)
MCH: 26.6 pg (ref 26.0–34.0)
MCHC: 29.5 g/dL — AB (ref 30.0–36.0)
MCV: 90.3 fL (ref 78.0–100.0)
PLATELETS: 213 10*3/uL (ref 150–400)
RBC: 3.49 MIL/uL — ABNORMAL LOW (ref 4.22–5.81)
RDW: 17.9 % — AB (ref 11.5–15.5)
WBC: 7.4 10*3/uL (ref 4.0–10.5)

## 2017-01-15 MED ORDER — GABAPENTIN 100 MG PO CAPS
100.0000 mg | ORAL_CAPSULE | Freq: Every day | ORAL | Status: DC
  Administered 2017-01-16 – 2017-01-19 (×4): 100 mg via ORAL
  Filled 2017-01-15 (×4): qty 1

## 2017-01-15 MED ORDER — GABAPENTIN 100 MG PO CAPS: 100.0000 mg | ORAL_CAPSULE | Freq: Two times a day (BID) | ORAL | Status: DC

## 2017-01-15 MED ORDER — DOXERCALCIFEROL 4 MCG/2ML IV SOLN
INTRAVENOUS | Status: AC
  Filled 2017-01-15: qty 2

## 2017-01-15 NOTE — Procedures (Signed)
Tol HD, no instability.  Will need lower EDW after amputation. Estanislado Emms

## 2017-01-15 NOTE — Progress Notes (Signed)
PROGRESS NOTE    Matthew Kline  UTM:[id number] DOB: 12/04/1960 DOA: 01/09/2017 PCP: Sandi Mariscal, MD   Brief Narrative:  Matthew Kline is a 57 y.o. male with medical history significant of ESRD on HD(M/W/F), CHF, CAD s/p CABG 05/2016, PAD, CVA, anemia, and chronic ulcer with h/o osteomyelitis of the left foot; severe Charcots deformity of L foot presents after having a fall at home a few days ago after his left knee gave way.   He reports falling onto his side with no loss of consciousness or trauma to his head.  However, thereafter patient noted that he was unable to ambulate due to severe pain in the left foot and knee. He was taken to the OR on 1/2 and found to have an abscess and septic joint. Pt is pod 1 AKA amputation by ortho   Assessment & Plan:     Infection of prosthetic left knee joint (HCC)/Septic left knee -chronic osteomyelitis of L foot and severe Charcots deformity  -Ortho Dr.Duda following for Left AKA today -ID Dr.Campbell following: plan is for cefazolin early next week.  -synovial fluid cx from 1/2 with MSSA  Fall, gait disturbance: Acute.   -PT Eval after surgery  ESRD on HD:  -renal following, status post hemodialysis yesterday -stable  Essential hypertension - Continue Coreg, hold amlodipine   Anemia of chronic disease:  -Aranesp per renal  CAD, PAD:  -status post CABG in 05/2016 and has had previous endovascular intervention for peripheral artery disease. - Continue Plavix and aspirin -stable  Hyperlipidemia - Continue atorvastatin  Diabetes mellitus type 2: - Patient currently controlled with diet.  Last hemoglobin A1c 6.4 on 11/08/2016. - SSI  Peripheral neuropathy - Continue gabapentin  DVT prophylaxis: SQ Heparin  Code Status:Full Code Family Communication:wife at bedside Disposition Plan: pending stability postop, may need rehabilitation  Consultants:   Renal  ortho  Subjective: -Pt has no new complaints  currently  Objective: Vitals:   01/15/17 1000 01/15/17 1030 01/15/17 1100 01/15/17 1155  BP: (!) 110/59 118/65 (!) 101/55 123/69  Pulse: 73 73 72 67  Resp: 15 16 15 18   Temp:   97.8 F (36.6 C) 98.2 F (36.8 C)  TempSrc:   Oral Oral  SpO2:      Weight:   70.8 kg (156 lb 1.4 oz)   Height:        Intake/Output Summary (Last 24 hours) at 01/15/2017 1339 Last data filed at 01/15/2017 1100 Gross per 24 hour  Intake 300 ml  Output 1500 ml  Net -1200 ml   Filed Weights   01/13/17 1141 01/15/17 0650 01/15/17 1100  Weight: 83 kg (182 lb 15.7 oz) 72.8 kg (160 lb 7.9 oz) 70.8 kg (156 lb 1.4 oz)    Examination:  Gen: Awake, Alert, Oriented X 3 HEENT: PERRLA, Neck supple, no JVD Lungs: decreased breath sounds at bases, rest clear CVS: S1/S2, regular rate rhythm Abd: soft, Non tender, non distended, BS present Extremities: L AKA Skin: no new rashes    Data Reviewed: I have personally reviewed following labs and imaging studies  CBC: Recent Labs  Lab 01/09/17 1243 01/10/17 0502 01/11/17 0440 01/13/17 0800 01/15/17 0530  WBC 10.9* 10.0 8.1 5.8 7.4  NEUTROABS 9.8*  --   --   --   --   HGB 10.3* 9.7* 9.8* 9.0* 9.3*  HCT 32.7* 31.9* 31.6* 29.9* 31.5*  MCV 90.6 91.7 89.3 91.4 90.3  PLT 162 146* 159 171 568   Basic Metabolic Panel: Recent Labs  Lab 01/09/17 1243 01/10/17 0502 01/11/17 0440 01/13/17 0800 01/15/17 0530  NA 133* 134* 132* 133* 135  K 4.1 5.0 4.9 4.3 4.1  CL 93* 93* 87* 93* 94*  CO2 27 28 27 28 28   GLUCOSE 124* 99 137* 114* 111*  BUN 40* 48* 62* 46* 33*  CREATININE 5.99* 6.83* 8.05* 6.58* 5.68*  CALCIUM 8.9 8.9 9.1 8.6* 9.6  PHOS  --   --  5.9* 5.7*  --    GFR: Estimated Creatinine Clearance: 14.4 mL/min (A) (by C-G formula based on SCr of 5.68 mg/dL (H)). Liver Function Tests: Recent Labs  Lab 01/11/17 0440 01/13/17 0800  ALBUMIN 2.2* 2.2*   No results for input(s): LIPASE, AMYLASE in the last 168 hours. No results for input(s): AMMONIA in  the last 168 hours. Coagulation Profile: No results for input(s): INR, PROTIME in the last 168 hours. Cardiac Enzymes: No results for input(s): CKTOTAL, CKMB, CKMBINDEX, TROPONINI in the last 168 hours. BNP (last 3 results) No results for input(s): PROBNP in the last 8760 hours. HbA1C: No results for input(s): HGBA1C in the last 72 hours. CBG: Recent Labs  Lab 01/14/17 1740 01/14/17 2306 01/15/17 0612 01/15/17 0946 01/15/17 1244  GLUCAP 116* 130* 109* 125* 114*   Lipid Profile: No results for input(s): CHOL, HDL, LDLCALC, TRIG, CHOLHDL, LDLDIRECT in the last 72 hours. Thyroid Function Tests: No results for input(s): TSH, T4TOTAL, FREET4, T3FREE, THYROIDAB in the last 72 hours. Anemia Panel: No results for input(s): VITAMINB12, FOLATE, FERRITIN, TIBC, IRON, RETICCTPCT in the last 72 hours. Urine analysis:    Component Value Date/Time   COLORURINE YELLOW 01/03/2012 1335   APPEARANCEUR Clear 07/12/2013 0918   LABSPEC 1.021 01/03/2012 1335   PHURINE 6.0 01/03/2012 1335   GLUCOSEU Trace (A) 07/12/2013 0918   HGBUR TRACE (A) 01/03/2012 1335   BILIRUBINUR Negative 07/12/2013 [place] 01/03/2012 1335   PROTEINUR 3+ (A) 07/12/2013 0918   PROTEINUR >300 (A) 01/03/2012 1335   UROBILINOGEN 0.2 01/03/2012 1335   NITRITE Negative 07/12/2013 0918   NITRITE NEGATIVE 01/03/2012 1335   LEUKOCYTESUR Negative 07/12/2013 0918     ) Recent Results (from the past 240 hour(s))  Aerobic Culture (superficial specimen)     Status: None   Collection Time: 01/06/17 12:32 PM  Result Value Ref Range Status   Specimen Description FOOT LEFT PLANTAR  Final   Special Requests NONE  Final   Gram Stain   Final    NO WBC SEEN ABUNDANT GRAM POSITIVE COCCI IN PAIRS IN CLUSTERS ABUNDANT GRAM NEGATIVE RODS FEW GRAM POSITIVE RODS    Culture   Final    MODERATE STAPHYLOCOCCUS AUREUS WITHIN MIXED ORGANISMS Performed at [facility], [place] [address], [place],  [address]     Report Status 01/09/2017 FINAL  Final   Organism ID, Bacteria STAPHYLOCOCCUS AUREUS  Final      Susceptibility   Staphylococcus aureus - MIC*    CIPROFLOXACIN <=0.5 SENSITIVE Sensitive     ERYTHROMYCIN >=8 RESISTANT Resistant     GENTAMICIN <=0.5 SENSITIVE Sensitive     OXACILLIN <=0.25 SENSITIVE Sensitive     TETRACYCLINE <=1 SENSITIVE Sensitive     VANCOMYCIN <=0.5 SENSITIVE Sensitive     TRIMETH/SULFA <=10 SENSITIVE Sensitive     CLINDAMYCIN <=0.25 SENSITIVE Sensitive     RIFAMPIN <=0.5 SENSITIVE Sensitive     Inducible Clindamycin NEGATIVE Sensitive     * MODERATE STAPHYLOCOCCUS AUREUS  MRSA PCR Screening     Status: None  Collection Time: 01/10/17  8:53 PM  Result Value Ref Range Status   MRSA by PCR NEGATIVE NEGATIVE Final    Comment:        The GeneXpert MRSA Assay (FDA approved for NASAL specimens only), is one component of a comprehensive MRSA colonization surveillance program. It is not intended to diagnose MRSA infection nor to guide or monitor treatment for MRSA infections.   Surgical PCR screen     Status: Abnormal   Collection Time: 01/12/17  3:11 AM  Result Value Ref Range Status   MRSA, PCR NEGATIVE NEGATIVE Final   Staphylococcus aureus POSITIVE (A) NEGATIVE Final    Comment: (NOTE) The Xpert SA Assay (FDA approved for NASAL specimens in patients 52 years of age and older), is one component of a comprehensive surveillance program. It is not intended to diagnose infection nor to guide or monitor treatment.   Aerobic/Anaerobic Culture (surgical/deep wound)     Status: None (Preliminary result)   Collection Time: 01/12/17 10:43 AM  Result Value Ref Range Status   Specimen Description SYNOVIAL LEFT KNEE  Final   Special Requests SPEC A ON SWABS  Final   Gram Stain   Final    RARE WBC PRESENT, PREDOMINANTLY MONONUCLEAR FEW GRAM POSITIVE COCCI IN PAIRS    Culture   Final    FEW STAPHYLOCOCCUS AUREUS NO ANAEROBES ISOLATED; CULTURE IN PROGRESS FOR 5  DAYS    Report Status PENDING  Incomplete   Organism ID, Bacteria STAPHYLOCOCCUS AUREUS  Final      Susceptibility   Staphylococcus aureus - MIC*    CIPROFLOXACIN <=0.5 SENSITIVE Sensitive     ERYTHROMYCIN >=8 RESISTANT Resistant     GENTAMICIN <=0.5 SENSITIVE Sensitive     OXACILLIN 0.5 SENSITIVE Sensitive     TETRACYCLINE <=1 SENSITIVE Sensitive     VANCOMYCIN 1 SENSITIVE Sensitive     TRIMETH/SULFA <=10 SENSITIVE Sensitive     CLINDAMYCIN <=0.25 SENSITIVE Sensitive     RIFAMPIN <=0.5 SENSITIVE Sensitive     Inducible Clindamycin NEGATIVE Sensitive     * FEW STAPHYLOCOCCUS AUREUS  Fungus Culture With Stain     Status: None (Preliminary result)   Collection Time: 01/12/17 10:43 AM  Result Value Ref Range Status   Fungus Stain Final report  Final    Comment: (NOTE) Performed At: [facility] [place], [place] [id number] Rush Farmer MD ZE:[id number]    Fungus (Mycology) Culture PENDING  Incomplete   Fungal Source SYNOVIAL  Final    Comment: LEFT KNEE   Fungus Culture Result     Status: None   Collection Time: 01/12/17 10:43 AM  Result Value Ref Range Status   Result 1 Comment  Final    Comment: (NOTE) KOH/Calcofluor preparation:  no fungus observed. Performed At: [facility] [place], [place] [id number] Rush Farmer MD YB:[id number]       Anti-infectives (From admission, onward)   Start     Dose/Rate Route Frequency Ordered Stop   01/17/17 1200  vancomycin (VANCOCIN) IVPB 1000 mg/200 mL premix  Status:  Discontinued     1,000 mg 200 mL/hr over 60 Minutes Intravenous Every M-W-F (Hemodialysis) 01/12/17 1600 01/14/17 1436   01/15/17 1200  ceFAZolin (ANCEF) IVPB 2g/100 mL premix     2 g 200 mL/hr over 30 Minutes Intravenous Every T-Th-Sa (Hemodialysis) 01/14/17 1558     01/13/17 1200  vancomycin (VANCOCIN) IVPB 1000 mg/200 mL premix  Status:  Discontinued     1,000 mg 200  mL/hr over 60 Minutes Intravenous Every  T-Th-Sa (Hemodialysis) 01/12/17 1600 01/14/17 1558   01/12/17 1300  vancomycin (VANCOCIN) 2,000 mg in sodium chloride 0.9 % 500 mL IVPB     2,000 mg 250 mL/hr over 120 Minutes Intravenous  Once 01/12/17 1255 01/12/17 1656   01/12/17 1300  piperacillin-tazobactam (ZOSYN) IVPB 3.375 g  Status:  Discontinued     3.375 g 12.5 mL/hr over 240 Minutes Intravenous Every 12 hours 01/12/17 1255 01/14/17 1436   01/12/17 0927  ceFAZolin (ANCEF) 2-4 GM/100ML-% IVPB    Comments:  Schonewitz, Leigh   : cabinet override      01/12/17 0927 01/12/17 1001   01/12/17 0707  ceFAZolin (ANCEF) IVPB 2g/100 mL premix     2 g 200 mL/hr over 30 Minutes Intravenous On call to O.R. 01/12/17 0707 01/12/17 1001   01/09/17 2200  clindamycin (CLEOCIN) capsule 300 mg  Status:  Discontinued     300 mg Oral Every 8 hours 01/09/17 2019 01/10/17 1002       Radiology Studies: No results found.      Scheduled Meds: . aspirin EC  162 mg Oral Daily  . atorvastatin  40 mg Oral q1800  . carvedilol  25 mg Oral BID WC  . Chlorhexidine Gluconate Cloth  6 each Topical Daily  . clopidogrel  75 mg Oral Daily  . [START ON 01/19/2017] darbepoetin (ARANESP) injection - DIALYSIS  200 mcg Intravenous Q Wed-HD  . docusate sodium  100 mg Oral BID  . docusate sodium  100 mg Oral BID  . doxercalciferol  3 mcg Intravenous Q T,Th,Sa-HD  . ferric citrate  420 mg Oral TID WC  . gabapentin  100 mg Oral Daily  . heparin  5,000 Units Subcutaneous Q8H  . insulin aspart  0-9 Units Subcutaneous TID WC  . lidocaine (PF)  5 mL Intradermal Once  . montelukast  10 mg Oral QHS  . mupirocin ointment  1 application Nasal BID   Continuous Infusions: . sodium chloride    .  ceFAZolin (ANCEF) IV 2 g (01/15/17 1028)  . lactated ringers    . methocarbamol (ROBAXIN)  IV       LOS: 5 days    Time spent: 35 min    Velvet Bathe, MD [facility] Page via [email or web address] password [email or web address]  If 7PM-7AM, please contact night-coverage  01/15/2017,  1:39 PM

## 2017-01-15 NOTE — Progress Notes (Signed)
Pharmacy Antibiotic Note  Matthew Kline is a 57 y.o. male admitted on 01/09/2017 with septic knee, now s/p AKA.  Patient has ESRD on MWF schedule as outpatient.  Renal plans for HD on 1/3 and 1/5 and then resume MWF schedule starting 01/17/17.  No residual infection noted. Likely short course of post-op abx.  Plan: Continue cefazolin 2 g IV with HD F/U HD schedule/tolerance, clinical progress   Height: 5\' 11"  (180.3 cm) Weight: 160 lb 7.9 oz (72.8 kg)(Bed Scale) IBW/kg (Calculated) : 75.3  Temp (24hrs), Avg:97.9 F (36.6 C), Min:97.6 F (36.4 C), Max:98.2 F (36.8 C)  Recent Labs  Lab 01/09/17 1243 01/10/17 0502 01/11/17 0440 01/13/17 0800 01/15/17 0530  WBC 10.9* 10.0 8.1 5.8 7.4  CREATININE 5.99* 6.83* 8.05* 6.58* 5.68*    Estimated Creatinine Clearance: 14.8 mL/min (A) (by C-G formula based on SCr of 5.68 mg/dL (H)).    Allergies  Allergen Reactions  . Morphine And Related Other (See Comments)    hallucinations  . Tygacil [Tigecycline] Nausea And Vomiting and Other (See Comments)    Makes him feel crazy     Cefazolin 2 g 1/2 x 1, 1/4>> Vancomycin 1/2 >>1/4 Zosyn 1/2 >>1/4>  1/2 Wound cx (L-knee) >> MSSA 12/31 MRSA PCR >> neg   Angus Seller, PharmD Pharmacy Resident [phone] 01/15/2017 9:46 AM

## 2017-01-15 NOTE — Progress Notes (Signed)
[name], male   DOB: 1960/07/10, 57 y.o.   MRN: [id number] Postoperative day 1 left above-the-knee amputation.  The cultures were positive for staph aureus.  Anticipate patient could be discharged on oral antibiotics for a month.  The surgical margins were clean the distal femur was packed with antibiotic beads.  The femur was cut just proximal to the femoral stem of the total knee arthroplasty so no contamination from the implant.  Does not clinically appear to be a need for IV antibiotics.

## 2017-01-15 NOTE — Evaluation (Addendum)
Physical Therapy Evaluation Patient Details Name: Matthew Kline MRN: [id number] DOB: October 11, 1960 Today's Date: 01/15/2017   History of Present Illness  58 y.o. male with medical history significant of ESRD on HD(M/W/F), CHF, CAD s/p CABG 05/2016, PAD, CVA, anemia, and chronic ulcer with h/o osteomyelitis of the left foot; severe Charcots deformity of L foot presents after having a fall at home a few days ago after his left knee gave way. He reports falling onto his side with no loss of consciousness or trauma to his head.  However, thereafter patient noted that he was unable to ambulate due to severe pain in the left foot and knee. He was taken to the OR on 1/2 and found to have an abscess and septic joint. 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 PTA, patient was living with teenage daughter in 2 bedroom apartment with stairs to enter. Patient was ambulating with SPC and independent with all ADLs. Upon eval, patient presents with post op pain and weakness, as well as confusion and anxiety, processing deficits  that are all different from cognitive baseline per wife. She also reports that patient does not respond well to pain medication, relayed info to RN. It is also clear that patient is undergoing an adverse emotional reaction to his amputation by his behavior and comments during therapy, wife agrees. Patient currently mod A for all OOB mobility and assist in transfers to bedside chair. I  feel he would benefit greatly from CIR to maximize functional mobility and safe return home. Patient will benefit from skilled PT to increase their independence and safety with mobility to allow discharge to the venue listed below.        Follow Up Recommendations CIR;Supervision/Assistance - 24 hour     Equipment Recommendations  Rolling walker with 5" wheels;3in1  (PT)    Recommendations for Other Services       Precautions / Restrictions Precautions Precautions: Fall Restrictions Weight Bearing Restrictions: Yes LLE Weight Bearing: Non weight bearing      Mobility  Bed Mobility Overal bed mobility: Needs Assistance Bed Mobility: Supine to Sit     Supine to sit: Min guard     General bed mobility comments: min guard for safety and balance.   Transfers Overall transfer level: Needs assistance Equipment used: Rolling walker (2 wheeled) Transfers: Sit to/from [facility] Sit to Stand: Mod assist Stand pivot transfers: Mod assist       General transfer comment: Mod A to transfer from bed to chair, able to stand in walker, reports dizziness. confused during session limited by confusion and anxiety. Patient in bedside chair, able to follow commands 50-70% of time.   Ambulation/Gait             General Gait Details: unable  Stairs            Wheelchair Mobility    Modified Rankin (Stroke Patients Only)       Balance Overall balance assessment: History of Falls                                           Pertinent Vitals/Pain Pain Assessment: 0-10 Pain Score: 8  Pain Location: L amputation site Pain Descriptors / Indicators: Constant;Aching;Crying;Grimacing Pain Intervention(s):  Limited activity within patient's tolerance;Monitored during session    [place] expects to be discharged to:: Skilled nursing facility Living Arrangements: Children Available Help at Discharge: Family;Available PRN/intermittently Type of Home: House           Additional Comments: Patient lives with 63 year old daughter in 2 bedroom apartment.     Prior Function Level of Independence: Independent with assistive device(s)         Comments: Indep with mobility prior to fall. Since fall, has been using SPC secondary to "weakness". Pt drives. 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 RLE 4/5)       Communication   Communication: No difficulties  Cognition Arousal/Alertness: Awake/alert Behavior During Therapy: Anxious;Agitated Overall Cognitive Status: Impaired/Different from baseline Area of Impairment: Orientation;Attention;Following commands;Safety/judgement;Awareness;Problem solving                 Orientation Level: Disoriented to;Situation Current Attention Level: Selective   Following Commands: Follows one step commands inconsistently Safety/Judgement: Decreased awareness of safety   Problem Solving: Difficulty sequencing;Requires verbal cues;Requires tactile cues General Comments: Per wife- patient is perfectly normal at baseline and is different than baseline. States he does not do well with IV pain mediation and that he is also very emotional over amputation. Wishes he speak to someone while in the hospital.       General Comments General comments (skin integrity, edema, etc.): Extenive discussion with patients wife about his AMS    Exercises     Assessment/Plan    PT Assessment Patient needs continued PT services  PT Problem List Decreased strength;Decreased range of motion;Decreased activity tolerance;Decreased balance;Decreased mobility;Decreased cognition;Pain       PT Treatment Interventions DME instruction;Gait training;Stair training;Functional mobility training;Therapeutic activities;Therapeutic exercise;Balance training;Patient/family education;Wheelchair mobility training    PT Goals (Current goals can be found in the Care Plan section)  Acute Rehab PT Goals Patient Stated Goal: "a good bit of rehab" PT Goal Formulation: With patient Time For Goal Achievement: 01/26/17 Potential to Achieve Goals: Good    Frequency Min 3X/week   Barriers to discharge Decreased caregiver  support daughter only avail for support PRN    Co-evaluation               AM-PAC PT "6 Clicks" Daily Activity  Outcome Measure Difficulty turning over in bed (including adjusting bedclothes, sheets and blankets)?: A Little Difficulty moving from lying on back to sitting on the side of the bed? : A Little Difficulty sitting down on and standing up from a chair with arms (e.g., wheelchair, bedside commode, etc,.)?: A Little Help needed moving to and from a bed to chair (including a wheelchair)?: A Little Help needed walking in hospital room?: A Lot Help needed climbing 3-5 steps with a railing? : Total 6 Click Score: 15    End of Session Equipment Utilized During Treatment: Gait belt Activity Tolerance: Treatment limited secondary to medical complications (Comment);Treatment limited secondary to agitation;Patient limited by lethargy Patient left: in chair;with call bell/phone within reach Nurse Communication: Mobility status;Other (comment)(discussed disorientation from cognitive baseline with RN.) PT Visit Diagnosis: Other abnormalities of gait and mobility (R26.89)    Time: 1620-1700 PT Time Calculation (min) (ACUTE ONLY): 40 min   Charges:   PT Evaluation $PT Eval High Complexity: 1 High PT Treatments $Therapeutic Activity:  8-22 mins $Self Care/Home Management: 8-22   PT G Codes:        Reinaldo Berber, PT, DPT Acute Rehab Services Pager: [phone]    Reinaldo Berber 01/15/2017, 5:48 PM

## 2017-01-16 LAB — GLUCOSE, CAPILLARY
GLUCOSE-CAPILLARY: 142 mg/dL — AB (ref 65–99)
GLUCOSE-CAPILLARY: 129 mg/dL — AB (ref 65–99)
Glucose-Capillary: 129 mg/dL — ABNORMAL HIGH (ref 65–99)
Glucose-Capillary: 167 mg/dL — ABNORMAL HIGH (ref 65–99)

## 2017-01-16 NOTE — Progress Notes (Signed)
PROGRESS NOTE    Matthew Kline  IWP:[id number] DOB: 09-11-1960 DOA: 01/09/2017 PCP: Sandi Mariscal, MD   Brief Narrative:  Matthew Kline is a 57 y.o. male with medical history significant of ESRD on HD(M/W/F), CHF, CAD s/p CABG 05/2016, PAD, CVA, anemia, and chronic ulcer with h/o osteomyelitis of the left foot; severe Charcots deformity of L foot presents after having a fall at home a few days ago after his left knee gave way.   He reports falling onto his side with no loss of consciousness or trauma to his head.  However, thereafter patient noted that he was unable to ambulate due to severe pain in the left foot and knee. He was taken to the OR on 1/2 and found to have an abscess and septic joint. 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  - synovial fluid cx from 1/2 with MSSA  Fall, gait disturbance: Acute.   - PT Eval after surgery  ESRD on HD:  -renal following, status post hemodialysis yesterday -stable  Essential hypertension - Continue Coreg, hold amlodipine   Anemia of chronic disease:  -Aranesp per renal  CAD, PAD:  -status post CABG in 05/2016 and has had previous endovascular intervention for peripheral artery disease. - Continue Plavix and aspirin -stable  Hyperlipidemia - Continue atorvastatin  Diabetes mellitus type 2: - Patient currently controlled with diet.  Last hemoglobin A1c 6.4 on 11/08/2016. - SSI  Peripheral neuropathy - Continue gabapentin  DVT prophylaxis: SQ Heparin  Code Status:Full Code Family Communication:wife at bedside Disposition Plan: pending stability postop, may need rehabilitation  Consultants:   Renal  ortho  Subjective: -no new complaints reported  Objective: Vitals:   01/15/17 1100 01/15/17 1155 01/15/17 2144 01/16/17 0620  BP: (!) 101/55 123/69 136/72 (!) 148/88  Pulse: 72 67 74 78  Resp: 15 18 18 16   Temp: 97.8 F (36.6 C) 98.2 F (36.8 C) 98.8 F (37.1 C) 98.6 F (37 C)  TempSrc: Oral Oral Oral Oral  SpO2:   99% 96%  Weight: 70.8 kg (156 lb 1.4 oz)     Height:        Intake/Output Summary (Last 24 hours) at 01/16/2017 1649 Last data filed at 01/16/2017 1200 Gross per 24 hour  Intake 480 ml  Output 250 ml  Net 230 ml   Filed Weights   01/13/17 1141 01/15/17 0650 01/15/17 1100  Weight: 83 kg (182 lb 15.7 oz) 72.8 kg (160 lb 7.9 oz) 70.8 kg (156 lb 1.4 oz)    Examination:  Gen: Awake, Alert, Oriented X 3 HEENT: PERRLA, Neck supple, no JVD Lungs: decreased breath sounds at bases, rest clear CVS: S1/S2, regular rate rhythm Abd: soft, Non tender, non distended, BS present Extremities: L AKA Skin: no new rashes on limited exam  Data Reviewed: I have personally reviewed following labs and imaging studies  CBC: Recent Labs  Lab 01/10/17 0502 01/11/17 0440 01/13/17 0800 01/15/17 0530  WBC 10.0 8.1 5.8 7.4  HGB 9.7* 9.8* 9.0* 9.3*  HCT 31.9* 31.6* 29.9* 31.5*  MCV 91.7 89.3 91.4 90.3  PLT 146* 159 171 505   Basic Metabolic Panel: Recent Labs  Lab 01/10/17 0502 01/11/17 0440 01/13/17 0800 01/15/17 0530  NA 134* 132* 133*  135  K 5.0 4.9 4.3 4.1  CL 93* 87* 93* 94*  CO2 28 27 28 28   GLUCOSE 99 137* 114* 111*  BUN 48* 62* 46* 33*  CREATININE 6.83* 8.05* 6.58* 5.68*  CALCIUM 8.9 9.1 8.6* 9.6  PHOS  --  5.9* 5.7*  --    GFR: Estimated Creatinine Clearance: 14.4 mL/min (A) (by C-G formula based on SCr of 5.68 mg/dL (H)). Liver Function Tests: Recent Labs  Lab 01/11/17 0440 01/13/17 0800  ALBUMIN 2.2* 2.2*   No results for input(s): LIPASE, AMYLASE in the last 168 hours. No results for input(s): AMMONIA in the last 168 hours. Coagulation Profile: No results for input(s): INR, PROTIME in the last 168 hours. Cardiac  Enzymes: No results for input(s): CKTOTAL, CKMB, CKMBINDEX, TROPONINI in the last 168 hours. BNP (last 3 results) No results for input(s): PROBNP in the last 8760 hours. HbA1C: No results for input(s): HGBA1C in the last 72 hours. CBG: Recent Labs  Lab 01/15/17 1656 01/15/17 2146 01/16/17 0618 01/16/17 1226 01/16/17 1609  GLUCAP 118* 133* 129* 142* 167*   Lipid Profile: No results for input(s): CHOL, HDL, LDLCALC, TRIG, CHOLHDL, LDLDIRECT in the last 72 hours. Thyroid Function Tests: No results for input(s): TSH, T4TOTAL, FREET4, T3FREE, THYROIDAB in the last 72 hours. Anemia Panel: No results for input(s): VITAMINB12, FOLATE, FERRITIN, TIBC, IRON, RETICCTPCT in the last 72 hours. Urine analysis:    Component Value Date/Time   COLORURINE YELLOW 01/03/2012 1335   APPEARANCEUR Clear 07/12/2013 0918   LABSPEC 1.021 01/03/2012 1335   PHURINE 6.0 01/03/2012 1335   GLUCOSEU Trace (A) 07/12/2013 0918   HGBUR TRACE (A) 01/03/2012 1335   BILIRUBINUR Negative 07/12/2013 [place] 01/03/2012 1335   PROTEINUR 3+ (A) 07/12/2013 0918   PROTEINUR >300 (A) 01/03/2012 1335   UROBILINOGEN 0.2 01/03/2012 1335   NITRITE Negative 07/12/2013 0918   NITRITE NEGATIVE 01/03/2012 1335   LEUKOCYTESUR Negative 07/12/2013 0918     ) Recent Results (from the past 240 hour(s))  MRSA PCR Screening     Status: None   Collection Time: 01/10/17  8:53 PM  Result Value Ref Range Status   MRSA by PCR NEGATIVE NEGATIVE Final    Comment:        The GeneXpert MRSA Assay (FDA approved for NASAL specimens only), is one component of a comprehensive MRSA colonization surveillance program. It is not intended to diagnose MRSA infection nor to guide or monitor treatment for MRSA infections.   Surgical PCR screen     Status: Abnormal   Collection Time: 01/12/17  3:11 AM  Result Value Ref Range Status   MRSA, PCR NEGATIVE NEGATIVE Final   Staphylococcus aureus POSITIVE (A) NEGATIVE Final     Comment: (NOTE) The Xpert SA Assay (FDA approved for NASAL specimens in patients 9 years of age and older), is one component of a comprehensive surveillance program. It is not intended to diagnose infection nor to guide or monitor treatment.   Aerobic/Anaerobic Culture (surgical/deep wound)     Status: None (Preliminary result)   Collection Time: 01/12/17 10:43 AM  Result Value Ref Range Status   Specimen Description SYNOVIAL LEFT KNEE  Final   Special Requests SPEC A ON SWABS  Final   Gram Stain   Final    RARE WBC PRESENT, PREDOMINANTLY MONONUCLEAR FEW GRAM POSITIVE COCCI IN PAIRS    Culture   Final    FEW STAPHYLOCOCCUS AUREUS NO ANAEROBES ISOLATED; CULTURE IN PROGRESS FOR 5 DAYS  Report Status PENDING  Incomplete   Organism ID, Bacteria STAPHYLOCOCCUS AUREUS  Final      Susceptibility   Staphylococcus aureus - MIC*    CIPROFLOXACIN <=0.5 SENSITIVE Sensitive     ERYTHROMYCIN >=8 RESISTANT Resistant     GENTAMICIN <=0.5 SENSITIVE Sensitive     OXACILLIN 0.5 SENSITIVE Sensitive     TETRACYCLINE <=1 SENSITIVE Sensitive     VANCOMYCIN 1 SENSITIVE Sensitive     TRIMETH/SULFA <=10 SENSITIVE Sensitive     CLINDAMYCIN <=0.25 SENSITIVE Sensitive     RIFAMPIN <=0.5 SENSITIVE Sensitive     Inducible Clindamycin NEGATIVE Sensitive     * FEW STAPHYLOCOCCUS AUREUS  Fungus Culture With Stain     Status: None (Preliminary result)   Collection Time: 01/12/17 10:43 AM  Result Value Ref Range Status   Fungus Stain Final report  Final    Comment: (NOTE) Performed At: [facility] [place], [place] [id number] Rush Farmer MD WU:[id number]    Fungus (Mycology) Culture PENDING  Incomplete   Fungal Source SYNOVIAL  Final    Comment: LEFT KNEE   Fungus Culture Result     Status: None   Collection Time: 01/12/17 10:43 AM  Result Value Ref Range Status   Result 1 Comment  Final    Comment: (NOTE) KOH/Calcofluor preparation:  no fungus observed. Performed  At: [facility] [place], [place] [id number] Rush Farmer MD VH:[id number]       Anti-infectives (From admission, onward)   Start     Dose/Rate Route Frequency Ordered Stop   01/17/17 1200  vancomycin (VANCOCIN) IVPB 1000 mg/200 mL premix  Status:  Discontinued     1,000 mg 200 mL/hr over 60 Minutes Intravenous Every M-W-F (Hemodialysis) 01/12/17 1600 01/14/17 1436   01/15/17 1200  ceFAZolin (ANCEF) IVPB 2g/100 mL premix     2 g 200 mL/hr over 30 Minutes Intravenous Every T-Th-Sa (Hemodialysis) 01/14/17 1558     01/13/17 1200  vancomycin (VANCOCIN) IVPB 1000 mg/200 mL premix  Status:  Discontinued     1,000 mg 200 mL/hr over 60 Minutes Intravenous Every T-Th-Sa (Hemodialysis) 01/12/17 1600 01/14/17 1558   01/12/17 1300  vancomycin (VANCOCIN) 2,000 mg in sodium chloride 0.9 % 500 mL IVPB     2,000 mg 250 mL/hr over 120 Minutes Intravenous  Once 01/12/17 1255 01/12/17 1656   01/12/17 1300  piperacillin-tazobactam (ZOSYN) IVPB 3.375 g  Status:  Discontinued     3.375 g 12.5 mL/hr over 240 Minutes Intravenous Every 12 hours 01/12/17 1255 01/14/17 1436   01/12/17 0927  ceFAZolin (ANCEF) 2-4 GM/100ML-% IVPB    Comments:  Schonewitz, Leigh   : cabinet override      01/12/17 0927 01/12/17 1001   01/12/17 0707  ceFAZolin (ANCEF) IVPB 2g/100 mL premix     2 g 200 mL/hr over 30 Minutes Intravenous On call to O.R. 01/12/17 0707 01/12/17 1001   01/09/17 2200  clindamycin (CLEOCIN) capsule 300 mg  Status:  Discontinued     300 mg Oral Every 8 hours 01/09/17 2019 01/10/17 1002       Radiology Studies: No results found.      Scheduled Meds: . aspirin EC  162 mg Oral Daily  . atorvastatin  40 mg Oral q1800  . carvedilol  25 mg Oral BID WC  . Chlorhexidine Gluconate Cloth  6 each Topical Daily  . clopidogrel  75 mg Oral Daily  . [START ON 01/19/2017] darbepoetin (ARANESP) injection - DIALYSIS  200  mcg Intravenous Q Wed-HD  . docusate sodium  100 mg Oral BID  .  docusate sodium  100 mg Oral BID  . doxercalciferol  3 mcg Intravenous Q T,Th,Sa-HD  . ferric citrate  420 mg Oral TID WC  . gabapentin  100 mg Oral Daily  . heparin  5,000 Units Subcutaneous Q8H  . insulin aspart  0-9 Units Subcutaneous TID WC  . lidocaine (PF)  5 mL Intradermal Once  . montelukast  10 mg Oral QHS  . mupirocin ointment  1 application Nasal BID   Continuous Infusions: . sodium chloride    .  ceFAZolin (ANCEF) IV 2 g (01/15/17 1028)  . lactated ringers    . methocarbamol (ROBAXIN)  IV       LOS: 6 days    Time spent: 35 min  Velvet Bathe, MD [facility] Page via [email or web address] password [email or web address]  If 7PM-7AM, please contact night-coverage  01/16/2017, 4:49 PM

## 2017-01-16 NOTE — Progress Notes (Signed)
[name], male   DOB: 22-May-1960, 57 y.o.   MRN: [id number] Patient is postoperative day 2 left above-knee amputation for septic knee and osteomyelitis of the left foot.  Patient is comfortable the wound VAC is clean and dry anticipate discharge to skilled nursing.

## 2017-01-16 NOTE — Progress Notes (Signed)
Dialysis Orders: MWF at [facility] 4hr, BFR 400, DFR 800, EDW 84.5kg, 2K/2Ca, AVF, heparin 4000 bolus - Mircera 257mcg IV q 2 weeks (last given 12/26) - Hectoral 69mcg (just ^'d from 6mcg) IV q HD  Assessment/Plan: 1. L knee quad tendon rupture: S/p fall. 2. Septic L knee: Hx infected L foot and L septic knee with Hx TKR (seeded hardware). Vanc/Zosyn started. 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 3. ESRD:Usually MWF schedule, prob SNF    Subjective: Interval History: Feels better  Objective: Vital signs in last 24 hours: Temp:  [98.6 F (37 C)-98.8 F (37.1 C)] 98.6 F (37 C) (01/06 0620) Pulse Rate:  [74-78] 78 (01/06 0620) Resp:  [16-18] 16 (01/06 0620) BP: (136-148)/(72-88) 148/88 (01/06 0620) SpO2:  [96 %-99 %] 96 % (01/06 0620) Weight change: -2 kg (-6.6 oz)  Intake/Output from previous day: 01/05 0701 - 01/06 0700 In: -  Out: 1500  Intake/Output this shift: Total I/O In: 240 [P.O.:240] Out: 250 [Urine:250]  General appearance: alert and cooperative Chest wall: no tenderness Cardio: regular rate and rhythm, S1, S2 normal, no murmur, click, rub or gallop Extremities: left AKA  Less myoclonus  Lab Results: Recent Labs    01/15/17 0530  WBC 7.4  HGB 9.3*  HCT 31.5*  PLT 213   BMET:  Recent Labs    01/15/17 0530  NA 135  K 4.1  CL 94*  CO2 28  GLUCOSE 111*  BUN 33*  CREATININE 5.68*  CALCIUM 9.6   No results for input(s): PTH in the last 72 hours. Iron Studies: No results for input(s): IRON, TIBC, TRANSFERRIN, FERRITIN in the last 72 hours. Studies/Results: No results found.  Scheduled: . aspirin EC  162 mg Oral Daily  . atorvastatin  40 mg Oral q1800  . carvedilol  25 mg Oral BID WC  . Chlorhexidine Gluconate Cloth  6 each Topical Daily  . clopidogrel  75 mg Oral Daily  . [START ON 01/19/2017] darbepoetin (ARANESP) injection - DIALYSIS  200 mcg Intravenous Q Wed-HD  . docusate sodium  100 mg Oral BID  . docusate sodium  100 mg Oral BID  .  doxercalciferol  3 mcg Intravenous Q T,Th,Sa-HD  . ferric citrate  420 mg Oral TID WC  . gabapentin  100 mg Oral Daily  . heparin  5,000 Units Subcutaneous Q8H  . insulin aspart  0-9 Units Subcutaneous TID WC  . lidocaine (PF)  5 mL Intradermal Once  . montelukast  10 mg Oral QHS  . mupirocin ointment  1 application Nasal BID    LOS: 6 days   Matthew Kline 01/16/2017,12:42 PM

## 2017-01-16 NOTE — Care Management Note (Signed)
Case Management Note  Patient Details  Name: Matthew Kline MRN: [id number] Date of Birth: 1960-04-26  Subjective/Objective:  POD2 AKA 2/2 Septic Joint and Osteo Left Foot. Sharol Given, MD notes anticipates dc to SNF                  Action/Plan:Will hold on Recommended DME  Expected Discharge Date:                  Expected Discharge Plan:     In-House Referral:  Clinical Social Work  Discharge planning Services  CM Consult  Post Acute Care Choice:    Choice offered to:     DME Arranged:    DME Agency:     HH Arranged:    [place] Agency:     Status of Service:  Completed, signed off  If discussed at [employer], dates discussed:    Additional Comments:  Delrae Sawyers, RN 01/16/2017, 11:48 AM

## 2017-01-16 NOTE — Progress Notes (Signed)
[facility] Infusion Coordinator will follow pt with ID team to support home IV ABX at DC if ordered.  If patient discharges after hours, please call [phone].   Larry Sierras 01/16/2017, 9:09 PM

## 2017-01-17 ENCOUNTER — Encounter (HOSPITAL_COMMUNITY): Payer: Self-pay | Admitting: Orthopedic Surgery

## 2017-01-17 LAB — RENAL FUNCTION PANEL
ANION GAP: 12 (ref 5–15)
Albumin: 2.4 g/dL — ABNORMAL LOW (ref 3.5–5.0)
BUN: 29 mg/dL — ABNORMAL HIGH (ref 6–20)
CHLORIDE: 95 mmol/L — AB (ref 101–111)
CO2: 30 mmol/L (ref 22–32)
Calcium: 10.4 mg/dL — ABNORMAL HIGH (ref 8.9–10.3)
Creatinine, Ser: 5.81 mg/dL — ABNORMAL HIGH (ref 0.61–1.24)
GFR calc non Af Amer: 10 mL/min — ABNORMAL LOW (ref >60)
GFR, EST AFRICAN AMERICAN: 11 mL/min — AB (ref >60)
Glucose, Bld: 151 mg/dL — ABNORMAL HIGH (ref 65–99)
Phosphorus: 4.2 mg/dL (ref 2.5–4.6)
Potassium: 3.8 mmol/L (ref 3.5–5.1)
Sodium: 137 mmol/L (ref 135–145)

## 2017-01-17 LAB — GLUCOSE, CAPILLARY
Glucose-Capillary: 125 mg/dL — ABNORMAL HIGH (ref 65–99)
Glucose-Capillary: 124 mg/dL — ABNORMAL HIGH (ref 65–99)
Glucose-Capillary: 156 mg/dL — ABNORMAL HIGH (ref 65–99)

## 2017-01-17 LAB — CBC
HCT: 33.8 % — ABNORMAL LOW (ref 39.0–52.0)
HEMOGLOBIN: 10.3 g/dL — AB (ref 13.0–17.0)
MCH: 27.2 pg (ref 26.0–34.0)
MCHC: 30.5 g/dL (ref 30.0–36.0)
MCV: 89.4 fL (ref 78.0–100.0)
Platelets: 249 10*3/uL (ref 150–400)
RBC: 3.78 MIL/uL — AB (ref 4.22–5.81)
RDW: 18.1 % — ABNORMAL HIGH (ref 11.5–15.5)
WBC: 11.2 10*3/uL — AB (ref 4.0–10.5)

## 2017-01-17 LAB — AEROBIC/ANAEROBIC CULTURE (SURGICAL/DEEP WOUND)

## 2017-01-17 LAB — AEROBIC/ANAEROBIC CULTURE W GRAM STAIN (SURGICAL/DEEP WOUND)

## 2017-01-17 MED ORDER — PRO-STAT SUGAR FREE PO LIQD
30.0000 mL | Freq: Two times a day (BID) | ORAL | Status: DC
  Administered 2017-01-17 – 2017-01-19 (×2): 30 mL via ORAL
  Filled 2017-01-17 (×3): qty 30

## 2017-01-17 MED ORDER — CEPHALEXIN 250 MG PO CAPS
250.0000 mg | ORAL_CAPSULE | Freq: Every day | ORAL | Status: DC
  Administered 2017-01-17 – 2017-01-19 (×3): 250 mg via ORAL
  Filled 2017-01-17 (×3): qty 1

## 2017-01-17 MED ORDER — HYDROMORPHONE HCL 1 MG/ML IJ SOLN
INTRAMUSCULAR | Status: AC
  Administered 2017-01-17: 1 mg via INTRAVENOUS
  Filled 2017-01-17: qty 1

## 2017-01-17 MED ORDER — DOXERCALCIFEROL 4 MCG/2ML IV SOLN
1.0000 ug | INTRAVENOUS | Status: DC
  Filled 2017-01-17: qty 2

## 2017-01-17 NOTE — Progress Notes (Signed)
   01/17/17 1400  Clinical Encounter Type  Visited With Patient  Visit Type Initial  Referral From Nurse  Consult/Referral To Chaplain  Spiritual Encounters  Spiritual Needs Prayer  Stress Factors  Patient Stress Factors Major life changes  Chaplain visited with PT to discuss major life changes.  The patient is an amputee but has come to resolve about his situation.  The PT has had constant prayer from his church family while in the hospital.

## 2017-01-17 NOTE — NC FL2 (Signed)
[place] LEVEL OF CARE SCREENING TOOL     IDENTIFICATION  Patient Name: Matthew Kline Birthdate: 1960/09/21 Sex: male Admission Date (Current Location): 01/09/2017  [facility] and [place] Number:  Herbalist and Address:  [facility] [place], [place] [address] [place], [place] [address]      Provider Number: [id number]  Attending Physician Name and Address:  Velvet Bathe, MD  Relative Name and Phone Number:  Larwance Rote, [phone]    Current Level of Care: Hospital Recommended Level of Care: [place] Prior Approval Number:    Date Approved/Denied:   PASRR Number: [id number]  Discharge Plan: SNF    Current Diagnoses: Patient Active Problem List   Diagnosis Date Noted  . Quadriceps muscle rupture, left, initial encounter   . Fall 01/09/2017  . Gait disturbance 01/09/2017  . Staphylococcus aureus infection 01/09/2017  . Infection of prosthetic left knee joint ([place]) 01/09/2017  . Fever, unknown origin 11/09/2016  . Critical lower limb ischemia 08/25/2016  . Ulcer of left midfoot with fat layer exposed ([place]) 08/13/2016  . Diabetic ulcer of left midfoot associated with type 2 diabetes mellitus, with fat layer exposed ([place]) 07/25/2016  . Peripheral neuropathy 07/22/2016  . Tobacco abuse 07/22/2016  . CAD in native artery 05/18/2016  . CAD, multiple vessel 05/11/2016  . Positive cardiac stress test 05/11/2016  . Abnormal stress test 04/30/2016  . Pre-transplant evaluation for kidney transplant 04/30/2016  . S/P revision of total knee 11/26/2015  . Pain in the chest   . Acute on chronic diastolic heart failure ([place]) 07/05/2015  . Volume overload 07/04/2015  . Shortness of breath 07/04/2015  . Hypoxemia 07/04/2015  . Elevated troponin   . End-stage renal disease on hemodialysis ([place])   . Hypervolemia   . 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 Pyogenic bacterial arthritis of knee, left ([place])  08/07/2014  . Tachycardia 07/24/2014  . Acute upper respiratory infection 07/24/2014  . ESRD on dialysis ([place]) 07/14/2014  . Type II diabetes mellitus ([place]) 07/14/2014  . Anemia in chronic kidney disease 07/14/2014  . Congestive heart failure (CHF) ([place]) 07/13/2014  . Surgical wound dehiscence 05/09/2014  . Dehiscence of closure of skin 05/09/2014  . Total knee replacement status 04/10/2014  . Diabetes mellitus with renal manifestations, controlled ([place]) 10/24/2013  . Hypertensive renal disease 06/27/2013  . DM type 2 causing vascular disease ([place]) 06/27/2013  . Erectile dysfunction 06/27/2013  . Depression 06/27/2013  . Claudication of left lower extremity ([place]) 12/19/2012  . Essential hypertension, benign 12/19/2012  . Sinusitis, acute maxillary 11/22/2012  . Otitis, externa, infective 11/14/2012  . Leg edema, left 11/14/2012  . End stage renal disease ([place]) 10/02/2012  . Controlled type 2 DM with proteinuria or microalbuminuria 09/19/2012  . GERD (gastroesophageal reflux disease) 09/19/2012  . Leukocytosis 09/19/2012  . Lacunar infarction 08/17/2012  . Polymyalgia rheumatica ([place]) 08/17/2012  . 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 Essential hypertension 05/10/2012  . Vitamin D deficiency 05/10/2012  . Diabetes mellitus due to underlying condition ([place]) 05/10/2012  . Hyperlipidemia LDL goal <100 05/10/2012  . Anemia of chronic disease 05/10/2012  . Screening for prostate cancer 05/10/2012  . Chronic kidney disease (CKD), stage IV (severe) ([place]) 05/10/2012  . Peripheral autonomic neuropathy due to DM ([place]) 05/10/2012  . Callus of foot 05/10/2012  . Urgency of urination 05/10/2012  . Hyperkalemia 05/10/2012  . Candidiasis of the esophagus 10/12/2011  . Internal hemorrhoids without mention of complication 67/59/1638  .  Pre-syncope 07/25/2009  . DJD (degenerative joint disease) of cervical spine 02/17/2009    Orientation RESPIRATION BLADDER Height & Weight     Self, Time,  Situation, Place  Normal Continent Weight: 162 lb 4.1 oz (73.6 kg) Height:  5\' 11"  (180.3 cm)  BEHAVIORAL SYMPTOMS/MOOD NEUROLOGICAL BOWEL NUTRITION STATUS      Continent Diet(See DC Summary)  AMBULATORY STATUS COMMUNICATION OF NEEDS Skin   Extensive Assist Verbally Surgical wounds, Wound Vac                       Personal Care Assistance Level of Assistance  Bathing, Feeding, Dressing Bathing Assistance: Limited assistance Feeding assistance: Independent Dressing Assistance: Limited assistance     Functional Limitations Info  Sight, Hearing, Speech Sight Info: Adequate Hearing Info: Adequate Speech Info: Adequate    SPECIAL CARE FACTORS FREQUENCY  PT (By licensed PT)     PT Frequency: 5x week              Contractures Contractures Info: Not present    Additional Factors Info  Code Status, Allergies, Insulin Sliding Scale, Isolation Precautions Code Status Info: Full Allergies Info: MORPHINE AND RELATED, TYGACIL TIGECYCLINE    Insulin Sliding Scale Info: [place] 3x's day with meals Isolation Precautions Info: MRSA     Current Medications (01/17/2017):  This is the current hospital active medication list Current Facility-Administered Medications  Medication Dose Route Frequency Provider Last Rate Last Dose  . 0.9 %  sodium chloride infusion   Intravenous Once Imagene Riches, CRNA      . acetaminophen (TYLENOL) tablet 650 mg  650 mg Oral Q4H PRN [name], MD   650 mg at 01/17/17 0450   Or  . acetaminophen (TYLENOL) suppository 650 mg  650 mg Rectal Q4H PRN [name], MD      . albuterol (PROVENTIL) (2.5 MG/3ML) 0.083% nebulizer solution 2.5 mg  2.5 mg Nebulization Q6H PRN Fuller Plan A, MD      . aspirin EC tablet 162 mg  162 mg Oral Daily Fuller Plan A, MD   162 mg at 01/17/17 6546  . atorvastatin (LIPITOR) tablet 40 mg  40 mg Oral q1800 Fuller Plan A, MD   40 mg at 01/16/17 1731  . bisacodyl (DULCOLAX) suppository 10 mg  10 mg Rectal Daily  PRN [name], MD      . camphor-menthol [facility]) lotion   Topical PRN Schorr, Rhetta Mura, NP      . carvedilol (COREG) tablet 25 mg  25 mg Oral BID WC Smith, Rondell A, MD   25 mg at 01/17/17 5035  . cephALEXin (KEFLEX) capsule 250 mg  250 mg Oral Daily Michel Bickers, MD   250 mg at 01/17/17 1222  . Chlorhexidine Gluconate Cloth 2 % PADS 6 each  6 each Topical Daily [name], MD   6 each at 01/17/17 [phone]  . clopidogrel (PLAVIX) tablet 75 mg  75 mg Oral Daily Fuller Plan A, MD   75 mg at 01/17/17 0839  . [START ON 01/19/2017] Darbepoetin Alfa (ARANESP) injection 200 mcg  200 mcg Intravenous Q Wed-HD Loren Racer, PA-C      . docusate sodium (COLACE) capsule 100 mg  100 mg Oral BID [name], MD   100 mg at 01/17/17 0839  . docusate sodium (COLACE) capsule 100 mg  100 mg Oral BID [name], MD   100 mg at 01/17/17 8127  . [START ON 01/18/2017]  doxercalciferol (HECTOROL) injection 1 mcg  1 mcg Intravenous Q T,Th,Sa-HD Stovall, Woodfin Ganja, PA-C      . feeding supplement (PRO-STAT SUGAR FREE 64) liquid 30 mL  30 mL Oral BID Loren Racer, PA-C      . ferric citrate (AURYXIA) tablet 420 mg  420 mg Oral TID WC Loren Racer, PA-C   420 mg at 01/17/17 1221  . gabapentin (NEURONTIN) capsule 100 mg  100 mg Oral Daily Velvet Bathe, MD   100 mg at 01/17/17 0838  . heparin injection 5,000 Units  5,000 Units Subcutaneous Q8H Fuller Plan A, MD   5,000 Units at 01/17/17 0559  . HYDROmorphone (DILAUDID) injection 1 mg  1 mg Intravenous Q2H PRN [name], MD   1 mg at 01/17/17 1601  . HYDROmorphone (DILAUDID) injection 1 mg  1 mg Intravenous Q2H PRN [name], MD   1 mg at 01/17/17 1336  . insulin aspart (novoLOG) injection 0-9 Units  0-9 Units Subcutaneous TID WC Vann, Jessica U, DO   1 Units at 01/17/17 1222  . lactated ringers infusion   Intravenous Continuous Lillia Abed, MD      . lidocaine (PF) (XYLOCAINE) 1 % injection 5 mL  5 mL Intradermal Once Pisciotta,  Charna Elizabeth   Stopped at 01/09/17 2235  . magnesium citrate solution 1 Bottle  1 Bottle Oral Once PRN [name], MD      . methocarbamol (ROBAXIN) tablet 500 mg  500 mg Oral Q6H PRN [name], MD   500 mg at 01/16/17 1731   Or  . methocarbamol (ROBAXIN) 500 mg in dextrose 5 % 50 mL IVPB  500 mg Intravenous Q6H PRN [name], MD      . montelukast (SINGULAIR) tablet 10 mg  10 mg Oral QHS Fuller Plan A, MD   10 mg at 01/16/17 2140  . mupirocin ointment (BACTROBAN) 2 % 1 application  1 application Nasal BID [name], MD   1 application at 12/87/86 [phone]  . ondansetron (ZOFRAN) tablet 4 mg  4 mg Oral Q6H PRN [name], MD       Or  . ondansetron [facility]) injection 4 mg  4 mg Intravenous Q6H PRN [name], MD      . ondansetron [facility]) tablet 4 mg  4 mg Oral Q6H PRN [name], MD       Or  . ondansetron [facility]) injection 4 mg  4 mg Intravenous Q6H PRN [name], MD      . oxyCODONE (Oxy IR/ROXICODONE) immediate release tablet 10 mg  10 mg Oral Q3H PRN [name], MD   10 mg at 01/16/17 1731  . oxyCODONE (Oxy IR/ROXICODONE) immediate release tablet 5 mg  5 mg Oral Q3H PRN [name], MD      . polyethylene glycol (MIRALAX / GLYCOLAX) packet 17 g  17 g Oral Daily PRN [name], MD      . temazepam (RESTORIL) capsule 30 mg  30 mg Oral QHS PRN Eulogio Bear U, DO   30 mg at 01/13/17 2228     Discharge Medications: Please see discharge summary for a list of discharge medications.  Relevant Imaging Results:  Relevant Lab Results:   Additional Information SS#: [id number]   MWF Hemodialysis  Normajean Baxter, LCSW

## 2017-01-17 NOTE — Anesthesia Postprocedure Evaluation (Signed)
Anesthesia Post Note  Patient: Matthew Kline  Procedure(s) Performed: AMPUTATION ABOVE LEFT KNEE (Left )     Patient location during evaluation: PACU Anesthesia Type: General Level of consciousness: awake and alert Pain management: pain level controlled Vital Signs Assessment: post-procedure vital signs reviewed and stable Respiratory status: spontaneous breathing, nonlabored ventilation, respiratory function stable and patient connected to nasal cannula oxygen Cardiovascular status: blood pressure returned to baseline and stable Postop Assessment: no apparent nausea or vomiting Anesthetic complications: no    Last Vitals:  Vitals:   01/16/17 2155 01/17/17 0441  BP: (!) 142/75 (!) 151/80  Pulse: 70 79  Resp: 16 16  Temp: 36.6 C 36.4 C  SpO2: 100% 96%    Last Pain:  Vitals:   01/17/17 0938  TempSrc:   PainSc: 0-No pain                 Marvelle Citlaly Camplin

## 2017-01-17 NOTE — Progress Notes (Signed)
[name], male   DOB: [birth year], 57 y.o.   MRN: [id number]          [facility] for Infectious Disease    Date of Admission:  01/09/2017   Total days of antibiotics 6         Dr. Sharol Given has indicated that the level of Mr. Guo was above the level of the prosthetic knee stem and presumably free of infection.  He will not need to be discharged on IV cefazolin.  I agree with changing to renally dosed oral cephalexin.  He should not need a long course of therapy.  I would leave him on this at least until he follows up with Dr. Sharol Given.  I will sign off now.         Michel Bickers, MD [facility] for Infectious [place] [facility] [phone] pager   [phone] cell 01/17/2017, 9:09 AM

## 2017-01-17 NOTE — Progress Notes (Signed)
[facility] Progress Note   Subjective:  Seen on HD. Denies CP or dyspnea. Having "squeezing sensation" in L stump, says took pain med earlier without any relief.   Objective Vitals:   01/16/17 0620 01/16/17 2155 01/17/17 0441 01/17/17 1406  BP: (!) 148/88 (!) 142/75 (!) 151/80 (!) 141/76  Pulse: 78 70 79 69  Resp: 16 16 16    Temp: 98.6 F (37 C) 97.8 F (36.6 C) 97.6 F (36.4 C) (!) 97.4 F (36.3 C)  TempSrc: Oral Oral Oral Oral  SpO2: 96% 100% 96% 96%  Weight:      Height:       Physical Exam General: Frail appearing man, NAD. Excoriations/scabbing across face. Heart: RRR; no murmur Lungs: CTAB Extremities: 2+ RLE edema, L AKA stump with wound vac present Dialysis Access: AVF  Additional Objective Labs: Basic Metabolic Panel: Recent Labs  Lab 01/11/17 0440 01/13/17 0800 01/15/17 0530 01/17/17 1345  NA 132* 133* 135 137  K 4.9 4.3 4.1 3.8  CL 87* 93* 94* 95*  CO2 27 28 28 30   GLUCOSE 137* 114* 111* 151*  BUN 62* 46* 33* 29*  CREATININE 8.05* 6.58* 5.68* 5.81*  CALCIUM 9.1 8.6* 9.6 10.4*  PHOS 5.9* 5.7*  --  4.2   Liver Function Tests: Recent Labs  Lab 01/11/17 0440 01/13/17 0800 01/17/17 1345  ALBUMIN 2.2* 2.2* 2.4*   CBC: Recent Labs  Lab 01/11/17 0440 01/13/17 0800 01/15/17 0530 01/17/17 1345  WBC 8.1 5.8 7.4 11.2*  HGB 9.8* 9.0* 9.3* 10.3*  HCT 31.6* 29.9* 31.5* 33.8*  MCV 89.3 91.4 90.3 89.4  PLT 159 171 213 249   Medications: . sodium chloride    . lactated ringers    . methocarbamol (ROBAXIN)  IV     . aspirin EC  162 mg Oral Daily  . atorvastatin  40 mg Oral q1800  . carvedilol  25 mg Oral BID WC  . cephALEXin  250 mg Oral Daily  . Chlorhexidine Gluconate Cloth  6 each Topical Daily  . clopidogrel  75 mg Oral Daily  . [START ON 01/19/2017] darbepoetin (ARANESP) injection - DIALYSIS  200 mcg Intravenous Q Wed-HD  . docusate sodium  100 mg Oral BID  . docusate sodium  100 mg Oral BID  . doxercalciferol  3 mcg  Intravenous Q T,Th,Sa-HD  . ferric citrate  420 mg Oral TID WC  . gabapentin  100 mg Oral Daily  . heparin  5,000 Units Subcutaneous Q8H  . insulin aspart  0-9 Units Subcutaneous TID WC  . lidocaine (PF)  5 mL Intradermal Once  . montelukast  10 mg Oral QHS  . mupirocin ointment  1 application Nasal BID    Dialysis Orders: MWF at [facility] 4hr, BFR 400, DFR 800, EDW 84.5kg, 2K/2Ca, AVF, heparin 4000 bolus - Mircera 241mcg IV q 2 weeks (last given 12/26) - Hectoral 58mcg (just ^'d from 58mcg) IV q HD  Assessment/Plan: 1. L knee quad tendon rupture: S/p fall. 2. Septic L knee: Hx infected L foot and L septic knee with Hx TKR (seeded hardware). Vanc/Zosyn started. 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 Abx stopped by ID. 3. ESRD:Back to usual MWF sched now, for HD today. 4. Hypertension/volume: BP controlled,+ RLE edema. New EDW ~70kg. 5. Anemia: Hgb 10.3, continue Aranesp 263mcg weekly. 6. Metabolic bone disease: Ca ^, Phos ok.Continue binders Lorin Picket), reducing VDRA. 7. CAD 8. Type 2 DM 9. Nutrition: Alb very low, continue supps.  Veneta Penton, PA-C 01/17/2017, 2:33 PM  [facility] Pager: [phone]  Pt seen, examined and agree w A/P as above.  Kelly Splinter MD [employer] pager [phone]   01/17/2017, 2:59 PM

## 2017-01-17 NOTE — Social Work (Signed)
CSW attempted to meet with patient again and patient at dialysis.  CSW will continue to f/u to discuss SNF placement.  Elissa Hefty, LCSW Clinical Social Worker [phone]

## 2017-01-17 NOTE — Progress Notes (Signed)
Physical Therapy Treatment Patient Details Name: Matthew Kline MRN: [id number] DOB: [birth year] Today's Date: 01/17/2017    History of Present Illness 57 y.o. male with medical history significant of ESRD on HD(M/W/F), CHF, CAD s/p CABG 05/2016, PAD, CVA, anemia, and chronic ulcer with h/o osteomyelitis of the left foot; severe Charcots deformity of L foot presents after having a fall at home a few days ago after his left knee gave way. He reports falling onto his side with no loss of consciousness or trauma to his head.  However, thereafter patient noted that he was unable to ambulate due to severe pain in the left foot and knee. He was taken to the OR on 1/2 and found to have an abscess and septic joint. Pt is now s/p AKA amputation by ortho 1/04    PT Comments    Patient is making progress toward mobility goals. Pt was able to ambulate 17ft forward then 1ft backwards. Limited by fatigue. Pt reported not sleeping much last night and drowsy during session. Pt given amputee HEP handout and reviewed. Continue to progress as tolerated.    Follow Up Recommendations  CIR;Supervision/Assistance - 24 hour     Equipment Recommendations  Rolling walker with 5" wheels;3in1 (PT)    Recommendations for Other Services       Precautions / Restrictions Precautions Precautions: Fall Restrictions Weight Bearing Restrictions: Yes LLE Weight Bearing: Non weight bearing    Mobility  Bed Mobility               General bed mobility comments: pt OOB in chair upon arrival  Transfers Overall transfer level: Needs assistance Equipment used: Rolling walker (2 wheeled) Transfers: Sit to/from [facility] Sit to Stand: Mod assist         General transfer comment: assist to power up into standing with cues for safe hand placement  Ambulation/Gait Ambulation/Gait assistance: Min assist Ambulation Distance (Feet): (56ft forward and 24ft backwards) Assistive device: Rolling walker  (2 wheeled) Gait Pattern/deviations: Step-to pattern     General Gait Details: cues for posture and proximity of RW   Stairs            Wheelchair Mobility    Modified Rankin (Stroke Patients Only)       Balance Overall balance assessment: History of Falls                                          Cognition Arousal/Alertness: Lethargic Behavior During Therapy: Flat affect Overall Cognitive Status: Within Functional Limits for tasks assessed                                        Exercises      General Comments        Pertinent Vitals/Pain Pain Assessment: Faces Faces Pain Scale: Hurts little more Pain Location: L amputation site Pain Descriptors / Indicators: Aching;Guarding Pain Intervention(s): Monitored during session;Premedicated before session;Repositioned    Home Living                      Prior Function            PT Goals (current goals can now be found in the care plan section) Acute Rehab PT Goals Patient Stated Goal: "a good bit of rehab"  PT Goal Formulation: With patient Time For Goal Achievement: 01/26/17 Potential to Achieve Goals: Good Progress towards PT goals: Progressing toward goals    Frequency    Min 3X/week      PT Plan Current plan remains appropriate    Co-evaluation              AM-PAC PT "6 Clicks" Daily Activity  Outcome Measure  Difficulty turning over in bed (including adjusting bedclothes, sheets and blankets)?: A Little Difficulty moving from lying on back to sitting on the side of the bed? : A Little Difficulty sitting down on and standing up from a chair with arms (e.g., wheelchair, bedside commode, etc,.)?: Unable Help needed moving to and from a bed to chair (including a wheelchair)?: A Little Help needed walking in hospital room?: A Little Help needed climbing 3-5 steps with a railing? : Total 6 Click Score: 14    End of Session Equipment Utilized  During Treatment: Gait belt Activity Tolerance: Patient limited by lethargy Patient left: in chair;with call bell/phone within reach Nurse Communication: Mobility status PT Visit Diagnosis: Other abnormalities of gait and mobility (R26.89)     Time: 1141-1200 PT Time Calculation (min) (ACUTE ONLY): 19 min  Charges:  $Gait Training: 8-22 mins                    G Codes:       Earney Navy, PTA Pager: [phone]     Darliss Cheney 01/17/2017, 1:22 PM

## 2017-01-17 NOTE — Progress Notes (Signed)
PROGRESS NOTE    Matthew Kline  UYQ:[id number] DOB: Nov 30, 1960 DOA: 01/09/2017 PCP: Sandi Mariscal, MD   Brief Narrative:  Matthew Kline is a 57 y.o. male with medical history significant of ESRD on HD(M/W/F), CHF, CAD s/p CABG 05/2016, PAD, CVA, anemia, and chronic ulcer with h/o osteomyelitis of the left foot; severe Charcots deformity of L foot presents after having a fall at home a few days ago after his left knee gave way.   He reports falling onto his side with no loss of consciousness or trauma to his head.  However, thereafter patient noted that he was unable to ambulate due to severe pain in the left foot and knee. He was taken to the OR on 1/2 and found to have an abscess and septic joint. 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 Sharol Given  Fall, gait disturbance: Acute.   - continue PT   ESRD on HD:  -renal following, status post hemodialysis yesterday -stable  Essential hypertension - Stable on Coreg, holding amlodipine   Anemia of chronic disease:  -Aranesp per renal  CAD, PAD:  -status post CABG in 05/2016 and has had previous endovascular intervention for peripheral artery disease. - Continue Plavix and aspirin currently stable  Hyperlipidemia - Continue atorvastatin  Diabetes mellitus type 2: - Patient currently controlled with diet.  Last hemoglobin A1c 6.4 on 11/08/2016. - SSI  Peripheral neuropathy - Continue gabapentin  DVT prophylaxis: SQ Heparin  Code Status:Full Code Family Communication: d/c patient directly Disposition Plan: pending stability postop, may need rehabilitation  Consultants:   Renal  ortho  Subjective: No new complaints  reported.  Objective: Vitals:   01/17/17 1444 01/17/17 1500 01/17/17 1530 01/17/17 1600  BP: 135/76 (!) 144/82 (!) 155/98 107/65  Pulse: 70 71 68 66  Resp:      Temp:      TempSrc:      SpO2:      Weight:      Height:        Intake/Output Summary (Last 24 hours) at 01/17/2017 1712 Last data filed at 01/16/2017 1812 Gross per 24 hour  Intake 240 ml  Output -  Net 240 ml   Filed Weights   01/15/17 0650 01/15/17 1100 01/17/17 1432  Weight: 72.8 kg (160 lb 7.9 oz) 70.8 kg (156 lb 1.4 oz) 73.6 kg (162 lb 4.1 oz)    Examination: Exam unchanged when compared to 01/16/2017  Gen: Awake, Alert, Oriented X 3 HEENT: PERRLA, Neck supple, no JVD Lungs: decreased breath sounds at bases, rest clear CVS: S1/S2, regular rate rhythm Abd: soft, Non tender, non distended, BS present Extremities: L AKA Skin: no new rashes on limited exam  Data Reviewed: I have personally reviewed following labs and imaging studies  CBC: Recent Labs  Lab 01/11/17 0440 01/13/17 0800 01/15/17 0530 01/17/17 1345  WBC 8.1 5.8 7.4 11.2*  HGB 9.8* 9.0* 9.3* 10.3*  HCT 31.6* 29.9* 31.5* 33.8*  MCV 89.3 91.4 90.3 89.4  PLT 159 171 213 638   Basic Metabolic Panel: Recent Labs  Lab 01/11/17 0440 01/13/17 0800 01/15/17 0530 01/17/17 1345  NA 132* 133* 135 137  K 4.9 4.3 4.1 3.8  CL 87* 93* 94*  95*  CO2 27 28 28 30   GLUCOSE 137* 114* 111* 151*  BUN 62* 46* 33* 29*  CREATININE 8.05* 6.58* 5.68* 5.81*  CALCIUM 9.1 8.6* 9.6 10.4*  PHOS 5.9* 5.7*  --  4.2   GFR: Estimated Creatinine Clearance: 14.6 mL/min (A) (by C-G formula based on SCr of 5.81 mg/dL (H)). Liver Function Tests: Recent Labs  Lab 01/11/17 0440 01/13/17 0800 01/17/17 1345  ALBUMIN 2.2* 2.2* 2.4*   No results for input(s): LIPASE, AMYLASE in the last 168 hours. No results for input(s): AMMONIA in the last 168 hours. Coagulation Profile: No results for input(s): INR, PROTIME in the last 168 hours. Cardiac Enzymes: No results for  input(s): CKTOTAL, CKMB, CKMBINDEX, TROPONINI in the last 168 hours. BNP (last 3 results) No results for input(s): PROBNP in the last 8760 hours. HbA1C: No results for input(s): HGBA1C in the last 72 hours. CBG: Recent Labs  Lab 01/16/17 1226 01/16/17 1609 01/16/17 2200 01/17/17 0640 01/17/17 1144  GLUCAP 142* 167* 129* 125* 124*   Lipid Profile: No results for input(s): CHOL, HDL, LDLCALC, TRIG, CHOLHDL, LDLDIRECT in the last 72 hours. Thyroid Function Tests: No results for input(s): TSH, T4TOTAL, FREET4, T3FREE, THYROIDAB in the last 72 hours. Anemia Panel: No results for input(s): VITAMINB12, FOLATE, FERRITIN, TIBC, IRON, RETICCTPCT in the last 72 hours. Urine analysis:    Component Value Date/Time   COLORURINE YELLOW 01/03/2012 1335   APPEARANCEUR Clear 07/12/2013 0918   LABSPEC 1.021 01/03/2012 1335   PHURINE 6.0 01/03/2012 1335   GLUCOSEU Trace (A) 07/12/2013 0918   HGBUR TRACE (A) 01/03/2012 1335   BILIRUBINUR Negative 07/12/2013 [place] 01/03/2012 1335   PROTEINUR 3+ (A) 07/12/2013 0918   PROTEINUR >300 (A) 01/03/2012 1335   UROBILINOGEN 0.2 01/03/2012 1335   NITRITE Negative 07/12/2013 0918   NITRITE NEGATIVE 01/03/2012 1335   LEUKOCYTESUR Negative 07/12/2013 0918     ) Recent Results (from the past 240 hour(s))  MRSA PCR Screening     Status: None   Collection Time: 01/10/17  8:53 PM  Result Value Ref Range Status   MRSA by PCR NEGATIVE NEGATIVE Final    Comment:        The GeneXpert MRSA Assay (FDA approved for NASAL specimens only), is one component of a comprehensive MRSA colonization surveillance program. It is not intended to diagnose MRSA infection nor to guide or monitor treatment for MRSA infections.   Surgical PCR screen     Status: Abnormal   Collection Time: 01/12/17  3:11 AM  Result Value Ref Range Status   MRSA, PCR NEGATIVE NEGATIVE Final   Staphylococcus aureus POSITIVE (A) NEGATIVE Final    Comment: (NOTE) The  Xpert SA Assay (FDA approved for NASAL specimens in patients 36 years of age and older), is one component of a comprehensive surveillance program. It is not intended to diagnose infection nor to guide or monitor treatment.   Aerobic/Anaerobic Culture (surgical/deep wound)     Status: None   Collection Time: 01/12/17 10:43 AM  Result Value Ref Range Status   Specimen Description SYNOVIAL LEFT KNEE  Final   Special Requests SPEC A ON SWABS  Final   Gram Stain   Final    RARE WBC PRESENT, PREDOMINANTLY MONONUCLEAR FEW GRAM POSITIVE COCCI IN PAIRS    Culture FEW STAPHYLOCOCCUS AUREUS NO ANAEROBES ISOLATED   Final   Report Status 01/17/2017 FINAL  Final   Organism ID, Bacteria STAPHYLOCOCCUS AUREUS  Final  Susceptibility   Staphylococcus aureus - MIC*    CIPROFLOXACIN <=0.5 SENSITIVE Sensitive     ERYTHROMYCIN >=8 RESISTANT Resistant     GENTAMICIN <=0.5 SENSITIVE Sensitive     OXACILLIN 0.5 SENSITIVE Sensitive     TETRACYCLINE <=1 SENSITIVE Sensitive     VANCOMYCIN 1 SENSITIVE Sensitive     TRIMETH/SULFA <=10 SENSITIVE Sensitive     CLINDAMYCIN <=0.25 SENSITIVE Sensitive     RIFAMPIN <=0.5 SENSITIVE Sensitive     Inducible Clindamycin NEGATIVE Sensitive     * FEW STAPHYLOCOCCUS AUREUS  Fungus Culture With Stain     Status: None (Preliminary result)   Collection Time: 01/12/17 10:43 AM  Result Value Ref Range Status   Fungus Stain Final report  Final    Comment: (NOTE) Performed At: [facility] [place], [place] [id number] Rush Farmer MD NA:[id number]    Fungus (Mycology) Culture PENDING  Incomplete   Fungal Source SYNOVIAL  Final    Comment: LEFT KNEE   Fungus Culture Result     Status: None   Collection Time: 01/12/17 10:43 AM  Result Value Ref Range Status   Result 1 Comment  Final    Comment: (NOTE) KOH/Calcofluor preparation:  no fungus observed. Performed At: [facility] [place], [place] [id number] Rush Farmer MD EG:[id number]       Anti-infectives (From admission, onward)   Start     Dose/Rate Route Frequency Ordered Stop   01/17/17 1200  vancomycin (VANCOCIN) IVPB 1000 mg/200 mL premix  Status:  Discontinued     1,000 mg 200 mL/hr over 60 Minutes Intravenous Every M-W-F (Hemodialysis) 01/12/17 1600 01/14/17 1436   01/17/17 1000  cephALEXin (KEFLEX) capsule 250 mg     250 mg Oral Daily 01/17/17 0908     01/15/17 1200  ceFAZolin (ANCEF) IVPB 2g/100 mL premix  Status:  Discontinued     2 g 200 mL/hr over 30 Minutes Intravenous Every T-Th-Sa (Hemodialysis) 01/14/17 1558 01/17/17 0908   01/13/17 1200  vancomycin (VANCOCIN) IVPB 1000 mg/200 mL premix  Status:  Discontinued     1,000 mg 200 mL/hr over 60 Minutes Intravenous Every T-Th-Sa (Hemodialysis) 01/12/17 1600 01/14/17 1558   01/12/17 1300  vancomycin (VANCOCIN) 2,000 mg in sodium chloride 0.9 % 500 mL IVPB     2,000 mg 250 mL/hr over 120 Minutes Intravenous  Once 01/12/17 1255 01/12/17 1656   01/12/17 1300  piperacillin-tazobactam (ZOSYN) IVPB 3.375 g  Status:  Discontinued     3.375 g 12.5 mL/hr over 240 Minutes Intravenous Every 12 hours 01/12/17 1255 01/14/17 1436   01/12/17 0927  ceFAZolin (ANCEF) 2-4 GM/100ML-% IVPB    Comments:  Schonewitz, Leigh   : cabinet override      01/12/17 0927 01/12/17 1001   01/12/17 0707  ceFAZolin (ANCEF) IVPB 2g/100 mL premix     2 g 200 mL/hr over 30 Minutes Intravenous On call to O.R. 01/12/17 0707 01/12/17 1001   01/09/17 2200  clindamycin (CLEOCIN) capsule 300 mg  Status:  Discontinued     300 mg Oral Every 8 hours 01/09/17 2019 01/10/17 1002       Radiology Studies: No results found.      Scheduled Meds: . aspirin EC  162 mg Oral Daily  . atorvastatin  40 mg Oral q1800  . carvedilol  25 mg Oral BID WC  . cephALEXin  250 mg Oral Daily  . Chlorhexidine Gluconate Cloth  6 each Topical Daily  . clopidogrel  75  mg Oral Daily  . [START ON 01/19/2017] darbepoetin (ARANESP) injection  - DIALYSIS  200 mcg Intravenous Q Wed-HD  . docusate sodium  100 mg Oral BID  . docusate sodium  100 mg Oral BID  . [START ON 01/18/2017] doxercalciferol  1 mcg Intravenous Q T,Th,Sa-HD  . feeding supplement (PRO-STAT SUGAR FREE 64)  30 mL Oral BID  . ferric citrate  420 mg Oral TID WC  . gabapentin  100 mg Oral Daily  . heparin  5,000 Units Subcutaneous Q8H  . insulin aspart  0-9 Units Subcutaneous TID WC  . lidocaine (PF)  5 mL Intradermal Once  . montelukast  10 mg Oral QHS  . mupirocin ointment  1 application Nasal BID   Continuous Infusions: . sodium chloride    . lactated ringers    . methocarbamol (ROBAXIN)  IV       LOS: 7 days    Time spent: 35 min  Velvet Bathe, MD [facility] Page via [email or web address] password [email or web address]  If 7PM-7AM, please contact night-coverage  01/17/2017, 5:12 PM

## 2017-01-18 ENCOUNTER — Encounter (HOSPITAL_COMMUNITY): Payer: Self-pay | Admitting: Orthopedic Surgery

## 2017-01-18 LAB — GLUCOSE, CAPILLARY
GLUCOSE-CAPILLARY: 175 mg/dL — AB (ref 65–99)
Glucose-Capillary: 130 mg/dL — ABNORMAL HIGH (ref 65–99)
Glucose-Capillary: 132 mg/dL — ABNORMAL HIGH (ref 65–99)

## 2017-01-18 MED ORDER — NEPRO/CARBSTEADY PO LIQD
237.0000 mL | Freq: Two times a day (BID) | ORAL | Status: DC
  Administered 2017-01-18: 237 mL via ORAL
  Filled 2017-01-18 (×5): qty 237

## 2017-01-18 NOTE — Social Work (Signed)
CSW met with patient to discuss SNF offers. Pt indicated that he needed time to think about SNF offers.  CSW explained to patient the need to obtain insurance auth. [name] and still desires time to select a SNF.  CSW will f/u.   , LCSW Clinical Social Worker [phone]   

## 2017-01-18 NOTE — Social Work (Signed)
CSW discussed SNF offers and patient has accepted bed offer from [facility].  CSW contacted SNF and confirmed bed offer.  CSW faxed clinicals to [facility] to start Insurance Auth.  CSW will f/u.  Elissa Hefty, LCSW Clinical Social Worker [phone]

## 2017-01-18 NOTE — Care Management Important Message (Signed)
Important Message  Patient Details  Name: Matthew Kline MRN: [id number] Date of Birth: 1960-08-07   Medicare Important Message Given:  Yes    [name] [address]ine Circle 01/18/2017, 12:58 PM

## 2017-01-18 NOTE — Clinical Social Work Note (Signed)
Clinical Social Work Assessment  Patient Details  Name: Matthew Kline MRN: [id number] Date of Birth: [birth year]  Date of referral:  01/18/17               Reason for consult:  Facility Placement                Permission sought to share information with:  Case Manager Permission granted to share information::  Yes, Verbal Permission Granted  Name::        Agency::  SNF  Relationship::     Contact Information:     Housing/Transportation Living arrangements for the past 2 months:  Single Family Home Source of Information:  Patient Patient Interpreter Needed:  None Criminal Activity/Legal Involvement Pertinent to Current Situation/Hospitalization:  No - Comment as needed Significant Relationships:  Adult Children, Other Family Members Lives with:  Other (Comment)(58 year old daughter) Do you feel safe going back to the place where you live?  No Need for family participation in patient care:  No (Coment)  Care giving concerns:  CSW met with patient at bedside to discuss the recommendation for SNF placement. [name] and indicated that he resides with 28 year old daughter. Pt indicates that he has minimal help at home, however daughter is not available during the day. Pt will need SNF placement.  CSW discussed insurance auth and the process for placement. Pt acknowledges and indicates that he went to SNF in the past and does not wish to return to prior SNF. CSW will send to other SNf's.  Social Worker assessment / plan:  CSW will assist with SNF placement, insurance auth, and transportation to SNF.   Employment status:  Disabled (Comment on whether or not currently receiving Disability) Insurance information:  Managed Medicare PT Recommendations:  [place] / Referral to community resources:  [place]  [name]:  Patient appreciative of CSW f/u for SNF placement. No issues or concerns identified at this  time.  Patient/Family's Understanding of and Emotional Response to Diagnosis, Current Treatment, and Prognosis:  Patient acknowledges that he will need facility given his new impairment. He has minimal help at home and is agreeable with going to facility. Pt has experience with SNF and had no questions about process. No other issues or concerns at this time.  Emotional Assessment Appearance:  Appears stated age Attitude/Demeanor/Rapport:  (Cooperative) Affect (typically observed):  Accepting, Appropriate Orientation:  Oriented to Self, Oriented to Place, Oriented to  Time, Oriented to Situation Alcohol / Substance use:  Not Applicable Psych involvement (Current and /or in the community):  No (Comment)  Discharge Needs  Concerns to be addressed:  Discharge Planning Concerns Readmission within the last 30 days:  No Current discharge risk:  Dependent with Mobility, Physical Impairment Barriers to Discharge:  No Barriers Identified   Normajean Baxter, LCSW 01/18/2017, 10:36 AM

## 2017-01-18 NOTE — Progress Notes (Signed)
[facility] Progress Note   Subjective:   Seen in room. No dyspnea or CP.  Objective Vitals:   01/17/17 1730 01/17/17 1800 01/17/17 1816 01/18/17 0500  BP: 108/60 97/63 116/61 (!) 130/57  Pulse: 66 68 65 83  Resp:      Temp:   (!) 97.4 F (36.3 C) 98.1 F (36.7 C)  TempSrc:   Oral Oral  SpO2:   97% 98%  Weight:   71.1 kg (156 lb 12 oz)   Height:       Physical Exam General: Frail appearing man, NAD. Excoriations/scabbing across face. Heart: RRR; no murmur Lungs: CTAB Extremities: Trace RLE edema, L AKA stump with wound vac present Dialysis Access: AVF   Additional Objective Labs: Basic Metabolic Panel: Recent Labs  Lab 01/13/17 0800 01/15/17 0530 01/17/17 1345  NA 133* 135 137  K 4.3 4.1 3.8  CL 93* 94* 95*  CO2 28 28 30   GLUCOSE 114* 111* 151*  BUN 46* 33* 29*  CREATININE 6.58* 5.68* 5.81*  CALCIUM 8.6* 9.6 10.4*  PHOS 5.7*  --  4.2   Liver Function Tests: Recent Labs  Lab 01/13/17 0800 01/17/17 1345  ALBUMIN 2.2* 2.4*   CBC: Recent Labs  Lab 01/13/17 0800 01/15/17 0530 01/17/17 1345  WBC 5.8 7.4 11.2*  HGB 9.0* 9.3* 10.3*  HCT 29.9* 31.5* 33.8*  MCV 91.4 90.3 89.4  PLT 171 213 249   CBG: Recent Labs  Lab 01/17/17 0640 01/17/17 1144 01/17/17 2218 01/18/17 0709 01/18/17 1142  GLUCAP 125* 124* 156* 130* 132*   Medications: . sodium chloride    . lactated ringers    . methocarbamol (ROBAXIN)  IV     . aspirin EC  162 mg Oral Daily  . atorvastatin  40 mg Oral q1800  . carvedilol  25 mg Oral BID WC  . cephALEXin  250 mg Oral Daily  . clopidogrel  75 mg Oral Daily  . [START ON 01/19/2017] darbepoetin (ARANESP) injection - DIALYSIS  200 mcg Intravenous Q Wed-HD  . docusate sodium  100 mg Oral BID  . docusate sodium  100 mg Oral BID  . doxercalciferol  1 mcg Intravenous Q T,Th,Sa-HD  . feeding supplement (PRO-STAT SUGAR FREE 64)  30 mL Oral BID  . ferric citrate  420 mg Oral TID WC  . gabapentin  100 mg Oral Daily  .  heparin  5,000 Units Subcutaneous Q8H  . insulin aspart  0-9 Units Subcutaneous TID WC  . lidocaine (PF)  5 mL Intradermal Once  . montelukast  10 mg Oral QHS  . mupirocin ointment  1 application Nasal BID    Dialysis Orders: MWF at [facility] 4hr, BFR 400, DFR 800, EDW 84.5kg, 2K/2Ca, AVF, heparin 4000 bolus - Mircera 290mcg IV q 2 weeks (last given 12/26) - Hectoral 51mcg (just ^'d from 93mcg) IV q HD  Assessment/Plan: 1. L knee quad tendon rupture: S/p fall. 2. Septic L knee:Hxinfected L foot and L septic knee with Hx TKR (seeded hardware). Vanc/Zosyn started.Now s/p L AKA on 01/14/17. Abx stopped by ID. 3. ESRD:Back to usual MWF sched now, next 1/9. 4. Hypertension/volume: BP controlled,+ RLE edema. New EDW ~70kg. 5. Anemia: Hgb 10.3, continue Aranesp 265mcg weekly. 6. Metabolic bone disease: Ca ^, Phos ok.Continue binders Lorin Picket), reducing VDRA. 7. CAD 8. Type 2 DM 9. Nutrition: Alb very low,continuesupps.    Veneta Penton, PA-C 01/18/2017, 12:42 PM  [facility] Pager: [phone]  Pt seen, examined and agree w A/P  as above.  Kelly Splinter MD [employer] pager [phone]   01/18/2017, 3:16 PM

## 2017-01-18 NOTE — Progress Notes (Signed)
PROGRESS NOTE    Matthew Kline  FUX:[id number] DOB: 1960-10-25 DOA: 01/09/2017 PCP: Sandi Mariscal, MD   Brief Narrative:  Matthew Kline is a 57 y.o. male with medical history significant of ESRD on HD(M/W/F), CHF, CAD s/p CABG 05/2016, PAD, CVA, anemia, and chronic ulcer with h/o osteomyelitis of the left foot; severe Charcots deformity of L foot presents after having a fall at home a few days ago after his left knee gave way.   He reports falling onto his side with no loss of consciousness or trauma to his head.  However, thereafter patient noted that he was unable to ambulate due to severe pain in the left foot and knee. He was taken to the OR on 1/2 and found to have an abscess and septic joint. 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 Sharol Given  Fall, gait disturbance: Acute.   - continue PT   ESRD on HD:  -renal following, status post hemodialysis yesterday -stable  Essential hypertension - Stable on Coreg, holding amlodipine   Anemia of chronic disease:  -Aranesp per renal  CAD, PAD:  -status post CABG in 05/2016 and has had previous endovascular intervention for peripheral artery disease. - Continue Plavix and aspirin currently stable  Hyperlipidemia - Continue atorvastatin  Diabetes mellitus type 2: - Patient currently controlled with diet.  Last hemoglobin A1c 6.4 on 11/08/2016. - SSI  Peripheral neuropathy - Continue gabapentin  DVT prophylaxis: SQ Heparin  Code Status:Full Code Family Communication: d/c patient directly Disposition Plan: started discharge planning discussed with team at progression rounds  Consultants:   Renal  ortho  Subjective: No new complaints  reported.  Objective: Vitals:   01/17/17 1800 01/17/17 1816 01/18/17 0500 01/18/17 1316  BP: 97/63 116/61 (!) 130/57 132/78  Pulse: 68 65 83 72  Resp:      Temp:  (!) 97.4 F (36.3 C) 98.1 F (36.7 C) 98.2 F (36.8 C)  TempSrc:  Oral Oral Oral  SpO2:  97% 98% 100%  Weight:  71.1 kg (156 lb 12 oz)    Height:        Intake/Output Summary (Last 24 hours) at 01/18/2017 1847 Last data filed at 01/18/2017 1331 Gross per 24 hour  Intake 600 ml  Output 0 ml  Net 600 ml   Filed Weights   01/15/17 1100 01/17/17 1432 01/17/17 1816  Weight: 70.8 kg (156 lb 1.4 oz) 73.6 kg (162 lb 4.1 oz) 71.1 kg (156 lb 12 oz)    Examination:  Gen: Awake, Alert, Oriented X 3 HEENT: PERRLA, Neck supple, no JVD Lungs: CTA BL, no wheezes, equal chest rise. CVS: S1/S2, regular rate rhythm Abd: soft, Non tender, non distended, BS present Extremities: L AKA Skin: no new rashes on limited exam  Data Reviewed: I have personally reviewed following labs and imaging studies  CBC: Recent Labs  Lab 01/13/17 0800 01/15/17 0530 01/17/17 1345  WBC 5.8 7.4 11.2*  HGB 9.0* 9.3* 10.3*  HCT 29.9* 31.5* 33.8*  MCV 91.4 90.3 89.4  PLT 171 213 025   Basic Metabolic Panel: Recent Labs  Lab 01/13/17 0800 01/15/17 0530 01/17/17 1345  NA 133* 135 137  K 4.3 4.1 3.8  CL  93* 94* 95*  CO2 28 28 30   GLUCOSE 114* 111* 151*  BUN 46* 33* 29*  CREATININE 6.58* 5.68* 5.81*  CALCIUM 8.6* 9.6 10.4*  PHOS 5.7*  --  4.2   GFR: Estimated Creatinine Clearance: 14.1 mL/min (A) (by C-G formula based on SCr of 5.81 mg/dL (H)). Liver Function Tests: Recent Labs  Lab 01/13/17 0800 01/17/17 1345  ALBUMIN 2.2* 2.4*   No results for input(s): LIPASE, AMYLASE in the last 168 hours. No results for input(s): AMMONIA in the last 168 hours. Coagulation Profile: No results for input(s): INR, PROTIME in the last 168 hours. Cardiac Enzymes: No results for input(s): CKTOTAL, CKMB, CKMBINDEX, TROPONINI in the last 168  hours. BNP (last 3 results) No results for input(s): PROBNP in the last 8760 hours. HbA1C: No results for input(s): HGBA1C in the last 72 hours. CBG: Recent Labs  Lab 01/17/17 1144 01/17/17 2218 01/18/17 0709 01/18/17 1142 01/18/17 1720  GLUCAP 124* 156* 130* 132* 175*   Lipid Profile: No results for input(s): CHOL, HDL, LDLCALC, TRIG, CHOLHDL, LDLDIRECT in the last 72 hours. Thyroid Function Tests: No results for input(s): TSH, T4TOTAL, FREET4, T3FREE, THYROIDAB in the last 72 hours. Anemia Panel: No results for input(s): VITAMINB12, FOLATE, FERRITIN, TIBC, IRON, RETICCTPCT in the last 72 hours. Urine analysis:    Component Value Date/Time   COLORURINE YELLOW 01/03/2012 1335   APPEARANCEUR Clear 07/12/2013 0918   LABSPEC 1.021 01/03/2012 1335   PHURINE 6.0 01/03/2012 1335   GLUCOSEU Trace (A) 07/12/2013 0918   HGBUR TRACE (A) 01/03/2012 1335   BILIRUBINUR Negative 07/12/2013 [place] 01/03/2012 1335   PROTEINUR 3+ (A) 07/12/2013 0918   PROTEINUR >300 (A) 01/03/2012 1335   UROBILINOGEN 0.2 01/03/2012 1335   NITRITE Negative 07/12/2013 0918   NITRITE NEGATIVE 01/03/2012 1335   LEUKOCYTESUR Negative 07/12/2013 0918     ) Recent Results (from the past 240 hour(s))  MRSA PCR Screening     Status: None   Collection Time: 01/10/17  8:53 PM  Result Value Ref Range Status   MRSA by PCR NEGATIVE NEGATIVE Final    Comment:        The GeneXpert MRSA Assay (FDA approved for NASAL specimens only), is one component of a comprehensive MRSA colonization surveillance program. It is not intended to diagnose MRSA infection nor to guide or monitor treatment for MRSA infections.   Surgical PCR screen     Status: Abnormal   Collection Time: 01/12/17  3:11 AM  Result Value Ref Range Status   MRSA, PCR NEGATIVE NEGATIVE Final   Staphylococcus aureus POSITIVE (A) NEGATIVE Final    Comment: (NOTE) The Xpert SA Assay (FDA approved for NASAL specimens in patients  30 years of age and older), is one component of a comprehensive surveillance program. It is not intended to diagnose infection nor to guide or monitor treatment.   Aerobic/Anaerobic Culture (surgical/deep wound)     Status: None   Collection Time: 01/12/17 10:43 AM  Result Value Ref Range Status   Specimen Description SYNOVIAL LEFT KNEE  Final   Special Requests SPEC A ON SWABS  Final   Gram Stain   Final    RARE WBC PRESENT, PREDOMINANTLY MONONUCLEAR FEW GRAM POSITIVE COCCI IN PAIRS    Culture FEW STAPHYLOCOCCUS AUREUS NO ANAEROBES ISOLATED   Final   Report Status 01/17/2017 FINAL  Final   Organism ID, Bacteria STAPHYLOCOCCUS AUREUS  Final      Susceptibility   Staphylococcus aureus - MIC*  CIPROFLOXACIN <=0.5 SENSITIVE Sensitive     ERYTHROMYCIN >=8 RESISTANT Resistant     GENTAMICIN <=0.5 SENSITIVE Sensitive     OXACILLIN 0.5 SENSITIVE Sensitive     TETRACYCLINE <=1 SENSITIVE Sensitive     VANCOMYCIN 1 SENSITIVE Sensitive     TRIMETH/SULFA <=10 SENSITIVE Sensitive     CLINDAMYCIN <=0.25 SENSITIVE Sensitive     RIFAMPIN <=0.5 SENSITIVE Sensitive     Inducible Clindamycin NEGATIVE Sensitive     * FEW STAPHYLOCOCCUS AUREUS  Fungus Culture With Stain     Status: None (Preliminary result)   Collection Time: 01/12/17 10:43 AM  Result Value Ref Range Status   Fungus Stain Final report  Final    Comment: (NOTE) Performed At: [facility] [place], [place] [id number] Rush Farmer MD MB:[id number]    Fungus (Mycology) Culture PENDING  Incomplete   Fungal Source SYNOVIAL  Final    Comment: LEFT KNEE   Fungus Culture Result     Status: None   Collection Time: 01/12/17 10:43 AM  Result Value Ref Range Status   Result 1 Comment  Final    Comment: (NOTE) KOH/Calcofluor preparation:  no fungus observed. Performed At: [facility] [place], [place] [id number] Rush Farmer MD ES:[id number]       Anti-infectives (From  admission, onward)   Start     Dose/Rate Route Frequency Ordered Stop   01/17/17 1200  vancomycin (VANCOCIN) IVPB 1000 mg/200 mL premix  Status:  Discontinued     1,000 mg 200 mL/hr over 60 Minutes Intravenous Every M-W-F (Hemodialysis) 01/12/17 1600 01/14/17 1436   01/17/17 1000  cephALEXin (KEFLEX) capsule 250 mg     250 mg Oral Daily 01/17/17 0908     01/15/17 1200  ceFAZolin (ANCEF) IVPB 2g/100 mL premix  Status:  Discontinued     2 g 200 mL/hr over 30 Minutes Intravenous Every T-Th-Sa (Hemodialysis) 01/14/17 1558 01/17/17 0908   01/13/17 1200  vancomycin (VANCOCIN) IVPB 1000 mg/200 mL premix  Status:  Discontinued     1,000 mg 200 mL/hr over 60 Minutes Intravenous Every T-Th-Sa (Hemodialysis) 01/12/17 1600 01/14/17 1558   01/12/17 1300  vancomycin (VANCOCIN) 2,000 mg in sodium chloride 0.9 % 500 mL IVPB     2,000 mg 250 mL/hr over 120 Minutes Intravenous  Once 01/12/17 1255 01/12/17 1656   01/12/17 1300  piperacillin-tazobactam (ZOSYN) IVPB 3.375 g  Status:  Discontinued     3.375 g 12.5 mL/hr over 240 Minutes Intravenous Every 12 hours 01/12/17 1255 01/14/17 1436   01/12/17 0927  ceFAZolin (ANCEF) 2-4 GM/100ML-% IVPB    Comments:  Schonewitz, Leigh   : cabinet override      01/12/17 0927 01/12/17 1001   01/12/17 0707  ceFAZolin (ANCEF) IVPB 2g/100 mL premix     2 g 200 mL/hr over 30 Minutes Intravenous On call to O.R. 01/12/17 0707 01/12/17 1001   01/09/17 2200  clindamycin (CLEOCIN) capsule 300 mg  Status:  Discontinued     300 mg Oral Every 8 hours 01/09/17 2019 01/10/17 1002       Radiology Studies: No results found.      Scheduled Meds: . aspirin EC  162 mg Oral Daily  . atorvastatin  40 mg Oral q1800  . carvedilol  25 mg Oral BID WC  . cephALEXin  250 mg Oral Daily  . clopidogrel  75 mg Oral Daily  . [START ON 01/19/2017] darbepoetin (ARANESP) injection - DIALYSIS  200 mcg Intravenous Q Wed-HD  .  docusate sodium  100 mg Oral BID  . docusate sodium  100 mg Oral  BID  . doxercalciferol  1 mcg Intravenous Q T,Th,Sa-HD  . feeding supplement (NEPRO CARB STEADY)  237 mL Oral BID BM  . feeding supplement (PRO-STAT SUGAR FREE 64)  30 mL Oral BID  . ferric citrate  420 mg Oral TID WC  . gabapentin  100 mg Oral Daily  . heparin  5,000 Units Subcutaneous Q8H  . insulin aspart  0-9 Units Subcutaneous TID WC  . lidocaine (PF)  5 mL Intradermal Once  . montelukast  10 mg Oral QHS  . mupirocin ointment  1 application Nasal BID   Continuous Infusions: . sodium chloride    . lactated ringers    . methocarbamol (ROBAXIN)  IV       LOS: 8 days    Time spent: 35 min  Velvet Bathe, MD [facility] Page via [email or web address] password [email or web address]  If 7PM-7AM, please contact night-coverage  01/18/2017, 6:47 PM

## 2017-01-18 NOTE — Progress Notes (Signed)
Nutrition Follow Up  DOCUMENTATION CODES:   Not applicable  INTERVENTION:    Continue Prostat liquid protein po 30 ml BID with meals, each supplement provides 100 kcal, 15 grams protein  Nepro Shake po BID, each supplement provides 425 kcal and 19 grams protein  NUTRITION DIAGNOSIS:   Increased nutrient needs related to post-op healing as evidenced by estimated needs, ongoing  GOAL:   Patient will meet greater than or equal to 90% of their needs, progressing  MONITOR:   Diet advancement, PO intake, Supplement acceptance, Labs, Weight trends, Skin  ASSESSMENT:   57 y.o. Male with medical history significant of ESRD on HD(M/W/F), CHF, CAD s/p CABG 05/2016, PAD, CVA, anemia, and chronic ulcer with h/o osteomyelitis of the left foot; who presented after having a fall at home a few days ago after his left knee gave way.  Pt admitted with septic L knee infection. S/P I&D of knee 1/2. S/P AKA 1/4.  Average PO intake is 55% per flowsheet records. Prostat liquid protein ordered BID 1/2. Not meeting his kcal, protein needs. Labs and medications reviewed. CBG's [id number].  Diet Order:  Diet renal with fluid restriction Fluid restriction: 1200 mL Fluid; Room service appropriate? Yes; Fluid consistency: Thin  EDUCATION NEED:   No education needs have been identified at this time  Skin:  Skin Assessment: Skin Integrity Issues: Skin Integrity Issues:: Wound VAC Wound Vac: L leg Other: L prosthetic knee infection and diabetic foot infection         Last BM:  12/31  Height:   Ht Readings from Last 1 Encounters:  01/12/17 5\' 11"  (1.803 m)   Weight:   Wt Readings from Last 1 Encounters:  01/17/17 156 lb 12 oz (71.1 kg)   Wt Readings from Last 10 Encounters:  01/17/17 156 lb 12 oz (71.1 kg)  11/08/16 190 lb 7.6 oz (86.4 kg)  09/07/16 203 lb (92.1 kg)  08/25/16 190 lb 11.2 oz (86.5 kg)  08/17/16 199 lb 3.2 oz (90.4 kg)  07/14/16 191 lb 12.8 oz (87 kg)  06/24/16 199 lb  11.8 oz (90.6 kg)  01/15/16 196 lb (88.9 kg)  12/16/15 196 lb (88.9 kg)  12/09/15 196 lb (88.9 kg)   BMI:  Body mass index is 21.86 kg/m.  Estimated Nutritional Needs:   Kcal:  2200-2400  Protein:  110-125 gm  Fluid:  2.2-2.4 L  Arthur Holms, RD, LDN Pager #: [phone] After-Hours Pager #: [phone]

## 2017-01-19 DIAGNOSIS — N186 End stage renal disease: Secondary | ICD-10-CM

## 2017-01-19 DIAGNOSIS — T8454XD Infection and inflammatory reaction due to internal left knee prosthesis, subsequent encounter: Secondary | ICD-10-CM

## 2017-01-19 DIAGNOSIS — N185 Chronic kidney disease, stage 5: Secondary | ICD-10-CM

## 2017-01-19 DIAGNOSIS — Z992 Dependence on renal dialysis: Secondary | ICD-10-CM

## 2017-01-19 DIAGNOSIS — D631 Anemia in chronic kidney disease: Secondary | ICD-10-CM

## 2017-01-19 DIAGNOSIS — I10 Essential (primary) hypertension: Secondary | ICD-10-CM

## 2017-01-19 LAB — RENAL FUNCTION PANEL
Albumin: 2.2 g/dL — ABNORMAL LOW (ref 3.5–5.0)
Anion gap: 13 (ref 5–15)
BUN: 31 mg/dL — AB (ref 6–20)
CALCIUM: 9.3 mg/dL (ref 8.9–10.3)
CO2: 25 mmol/L (ref 22–32)
CREATININE: 5.57 mg/dL — AB (ref 0.61–1.24)
Chloride: 96 mmol/L — ABNORMAL LOW (ref 101–111)
GFR calc non Af Amer: 10 mL/min — ABNORMAL LOW (ref >60)
GFR, EST AFRICAN AMERICAN: 12 mL/min — AB (ref >60)
GLUCOSE: 105 mg/dL — AB (ref 65–99)
Phosphorus: 5.5 mg/dL — ABNORMAL HIGH (ref 2.5–4.6)
Potassium: 3.9 mmol/L (ref 3.5–5.1)
SODIUM: 134 mmol/L — AB (ref 135–145)

## 2017-01-19 LAB — CBC
HCT: 27.4 % — ABNORMAL LOW (ref 39.0–52.0)
Hemoglobin: 8.4 g/dL — ABNORMAL LOW (ref 13.0–17.0)
MCH: 27.5 pg (ref 26.0–34.0)
MCHC: 30.7 g/dL (ref 30.0–36.0)
MCV: 89.5 fL (ref 78.0–100.0)
PLATELETS: 248 10*3/uL (ref 150–400)
RBC: 3.06 MIL/uL — AB (ref 4.22–5.81)
RDW: 18.4 % — AB (ref 11.5–15.5)
WBC: 9.9 10*3/uL (ref 4.0–10.5)

## 2017-01-19 LAB — GLUCOSE, CAPILLARY
GLUCOSE-CAPILLARY: 103 mg/dL — AB (ref 65–99)
GLUCOSE-CAPILLARY: 121 mg/dL — AB (ref 65–99)
Glucose-Capillary: 72 mg/dL (ref 65–99)
Glucose-Capillary: 108 mg/dL — ABNORMAL HIGH (ref 65–99)

## 2017-01-19 MED ORDER — NEPRO/CARBSTEADY PO LIQD: 237.0000 mL | Freq: Two times a day (BID) | ORAL | 0 refills | Status: DC

## 2017-01-19 MED ORDER — ASPIRIN 81 MG PO TBEC: 162.0000 mg | DELAYED_RELEASE_TABLET | Freq: Every day | ORAL | Status: AC

## 2017-01-19 MED ORDER — CEPHALEXIN 250 MG PO CAPS: 250.0000 mg | ORAL_CAPSULE | Freq: Every day | ORAL | 0 refills | Status: DC

## 2017-01-19 MED ORDER — OXYCODONE HCL 5 MG PO TABS
ORAL_TABLET | ORAL | Status: AC
  Filled 2017-01-19: qty 2

## 2017-01-19 MED ORDER — PRO-STAT SUGAR FREE PO LIQD: 30.0000 mL | Freq: Two times a day (BID) | ORAL | 0 refills | Status: DC

## 2017-01-19 MED ORDER — DARBEPOETIN ALFA 200 MCG/0.4ML IJ SOSY
PREFILLED_SYRINGE | INTRAMUSCULAR | Status: AC
  Administered 2017-01-19: 200 ug via INTRAVENOUS
  Filled 2017-01-19: qty 0.4

## 2017-01-19 MED ORDER — HYDROMORPHONE HCL 1 MG/ML IJ SOLN
INTRAMUSCULAR | Status: AC
  Administered 2017-01-19: 1 mg via INTRAVENOUS
  Filled 2017-01-19: qty 1

## 2017-01-19 NOTE — Procedures (Signed)
Seen on HD. BFR 300, AVF, 2.5L UF goal. Pt tolerating without issues.  Exam: RRR 1+ RLE edema  C/o L stump pain, no CP or dyspnea.  Loren Racer, PA-C  [facility] Pager [phone]  Pt seen, examined and agree w A/P as above.  Kelly Splinter MD [employer] pager [phone]   01/19/2017, 12:59 PM

## 2017-01-19 NOTE — Discharge Summary (Signed)
Physician Discharge Summary  Matthew Kline  OEV:[id number]  DOB: 1960-05-12  DOA: 01/09/2017 PCP: Sandi Mariscal, MD  Admit date: 01/09/2017 Discharge date: 01/19/2017  Admitted From: Home  Disposition:  SNF   Recommendations for Outpatient Follow-up:  1. Follow up with PCP in 1-2 weeks 2. Follow up with Dr Sharol Given in 1 week  3. Please obtain BMP/CBC in one week to monitor Hgb and Cr  4. Discharged on antibiotics to discuss with orthopedic surgeon length of treatment 1 follow-up in 1 week.  Discharge Condition: Stable   CODE STATUS: Full Code   Diet recommendation: Heart Healthy  Brief/Interim Summary: For full details see H&P/progress note but in brief, Matthew Kline a 57 y.o.malewith medical history significant ofESRD on HD(M/W/F), CHF,CADs/p CABG 05/2016,PAD, CVA,anemia, and chronic ulcerwith h/oosteomyelitis of the left foot;severe Charcots deformity of L foot presents after having a fall at home a few days ago after hisleft kneegave way.  He reports falling onto his side with no loss of consciousness ortrauma to his head.However, thereafter patient noted that he was unable to ambulate due to severe pain in the left foot and knee. He was taken to the OR on 1/2 and found to have an abscess and septic joint. 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  Wound culture grew staph aureus methicillin sensitive.  During hospital stay dialysis was continue on schedule with no complications.  Patient has remained stable and most clear for discharge by orthopedic surgery.  PT evaluated patient and recommended short-term rehab, therefore patient will be discharged to SNF.  Subjective: Patient seen and examined, he received dialysis today, during dialysis treatment there was pain at the amputation site.  Patient was relieved by oral medication.  Patient is a febrile, tolerating diet well.  No other concerns.  Discharge Diagnoses/Hospital Course:  Infection of prosthetic left knee joint  (HCC)/Septic left knee - chronic osteomyelitis of L foot and severe Charcots deformity  - s/p Left AKA - Wound culture staph aureus methicillin sensitive - Treated with broad spectrum abx, transitioned to keflex, will continue for now until seen by Dr Sharol Given as outpatient.  - Wound VAC in place continue for 1 week, Dr.Duda to address as an outpatient - PT as tolerated   ESRD on HD: - Stable  - Nephrology was consulted and patient was dialyzed on schedule - Continue HD as scheduled   Essential hypertension -BP stable  - Resume home medications with no current changes - Amlodipine and Coreg   Anemia of chronic disease: - Aranesp per renal  CAD, PAD: -status post CABG in 5/2018and has had previous endovascular intervention for peripheral artery disease. -Continue Plavix and aspirin currently stable  Hyperlipidemia -Continue atorvastatin  Diabetes mellitus type 2: -Patient currently controlled with diet. Last hemoglobin A1c 6.4 on 11/08/2016.  Peripheral neuropathy -Continue gabapentin  All other chronic medical condition were stable during the hospitalization.  Patient was seen by physical therapy, recommending short-term rehab On the day of the discharge the patient's vitals were stable, and no other acute medical condition were reported by patient. the patient was felt safe to be discharge to SNF  Discharge Instructions  You were cared for by a hospitalist during your hospital stay. If you have any questions about your discharge medications or the care you received while you were in the hospital after you are discharged, you can call the unit and asked to speak with the hospitalist on call if the hospitalist that took care of you is not available. Once you  are discharged, your primary care physician will handle any further medical issues. Please note that NO REFILLS for any discharge medications will be authorized once you are discharged, as it is imperative that  you return to your primary care physician (or establish a relationship with a primary care physician if you do not have one) for your aftercare needs so that they can reassess your need for medications and monitor your lab values.  Discharge Instructions    Call MD for:  difficulty breathing, headache or visual disturbances   Complete by:  As directed    Call MD for:  extreme fatigue   Complete by:  As directed    Call MD for:  hives   Complete by:  As directed    Call MD for:  persistant dizziness or light-headedness   Complete by:  As directed    Call MD for:  persistant nausea and vomiting   Complete by:  As directed    Call MD for:  redness, tenderness, or signs of infection (pain, swelling, redness, odor or green/yellow discharge around incision site)   Complete by:  As directed    Call MD for:  severe uncontrolled pain   Complete by:  As directed    Call MD for:  temperature >100.4   Complete by:  As directed    Diet - low sodium heart healthy   Complete by:  As directed    Increase activity slowly   Complete by:  As directed      Allergies as of 01/19/2017      Reactions   Morphine And Related Other (See Comments)   hallucinations   Tygacil [tigecycline] Nausea And Vomiting, Other (See Comments)   Makes him feel crazy      Medication List    TAKE these medications   amLODipine 5 MG tablet Commonly known as:  NORVASC Take 5 mg by mouth daily at 6 PM.   aspirin 81 MG EC tablet Take 2 tablets (162 mg total) by mouth daily. Start taking on:  01/20/2017 What changed:  how much to take   atorvastatin 40 MG tablet Commonly known as:  LIPITOR Take 40 mg by mouth daily at 6 PM.   carvedilol 25 MG tablet Commonly known as:  COREG Take 25 mg by mouth daily at 6 PM.   cephALEXin 250 MG capsule Commonly known as:  KEFLEX Take 1 capsule (250 mg total) by mouth daily for 10 days. Start taking on:  01/20/2017   clopidogrel 75 MG tablet Commonly known as:  PLAVIX Take 1  tablet (75 mg total) by mouth daily. What changed:  when to take this   feeding supplement (NEPRO CARB STEADY) Liqd Take 237 mLs by mouth 2 (two) times daily between meals.   feeding supplement (PRO-STAT SUGAR FREE 64) Liqd Take 30 mLs by mouth 2 (two) times daily.   gabapentin 300 MG capsule Commonly known as:  NEURONTIN TAKE ONE CAPSULE BY MOUTH 3 TIMES A DAY What changed:    how much to take  how to take this  when to take this   montelukast 10 MG tablet Commonly known as:  SINGULAIR TAKE 1 TABLET (10 MG TOTAL) BY MOUTH AT BEDTIME.   oxyCODONE-acetaminophen 5-325 MG tablet Commonly known as:  PERCOCET Take 1 tablet by mouth every 4 (four) hours as needed.   RENVELA 800 MG tablet Generic drug:  sevelamer carbonate Take 1,600-2,400 mg by mouth 3 (three) times daily with meals. Take 2400 to 3200  mg with each me (based on the size of the meal) 3 times daily and take 800 mg with large snacks   temazepam 30 MG capsule Commonly known as:  RESTORIL Take 30 mg by mouth at bedtime.       Contact information for follow-up providers    [name], MD Follow up in 1 week(s).   Specialty:  Orthopedic Surgery Contact information: [place] [address] [phone]            Contact information for after-discharge care    [facility] Follow up.   Service:  Skilled Nursing Contact information: [address] [address] [phone]                 Allergies  Allergen Reactions  . Morphine And Related Other (See Comments)    hallucinations  . Tygacil [Tigecycline] Nausea And Vomiting and Other (See Comments)    Makes him feel crazy    Consultations:  Orthopedic surgery  Nephrologist   Procedures/Studies: Dg Ankle Complete Left  Result Date: 01/09/2017 CLINICAL DATA:  Patient reports recent fall, patient reports already having swelling of his entire left leg,  reports he has noticed more swelling, reports severe pain to left side, reports not being able to put pressure on left knee, general lef.*comment was truncated* EXAM: LEFT ANKLE COMPLETE - 3+ VIEW COMPARISON:  Foot films dictated separately.  Prior of 11/09/2016. FINDINGS: Diffuse soft tissue swelling about the ankle. Degenerate changes about the hindfoot. No acute fracture or dislocation. IMPRESSION: Soft tissue swelling with degenerative changes.  No acute findings. Electronically Signed   By: Abigail Miyamoto M.D.   On: 01/09/2017 13:40   Dg Knee Complete 4 Views Left  Result Date: 01/09/2017 CLINICAL DATA:  Recent fall with left knee pain EXAM: LEFT KNEE - COMPLETE 4+ VIEW COMPARISON:  08/07/2014 FINDINGS: Large joint effusion is noted with some areas of lucency within somewhat suspicious for septic joint. Displacement of the patella is noted by the effusion. No definitive acute fracture is seen. Diffuse soft tissue swelling is noted below the knee joint. IMPRESSION: Large joint effusion with some areas of lucency suspicious for a septic joint. Clinical correlation is recommended. Arthrocentesis may be helpful. No definitive acute fracture is seen. Electronically Signed   By: Inez Catalina M.D.   On: 01/09/2017 13:40   Dg Foot Complete Left  Result Date: 01/09/2017 CLINICAL DATA:  Recent fall.  Increased swelling. EXAM: LEFT FOOT - COMPLETE 3+ VIEW COMPARISON:  11/09/2016 FINDINGS: Re- demonstration of prior first phalanx and fourth transmetatarsal amputations. Vascular calcifications. Degenerate changes of the midfoot. No acute fracture or dislocation. No gross osseous destruction. No soft tissue gas or well-defined soft tissue ulcer. Diffuse soft tissue swelling, increased since the prior exam. No radiopaque foreign object. IMPRESSION: Postsurgical and degenerative changes, with progressive soft tissue swelling. No definite acute superimposed osseous abnormality. Electronically Signed   By: Abigail Miyamoto  M.D.   On: 01/09/2017 13:39    Discharge Exam: Vitals:   01/19/17 1100 01/19/17 1110  BP: (!) 98/55 116/75  Pulse: 68 71  Resp:  18  Temp:  (!) 97.5 F (36.4 C)  SpO2:  99%   Vitals:   01/19/17 1000 01/19/17 1030 01/19/17 1100 01/19/17 1110  BP: (!) 101/51 (!) 100/56 (!) 98/55 116/75  Pulse: 73 67 68 71  Resp:    18  Temp:    (!) 97.5 F (36.4  C)  TempSrc:    Oral  SpO2:    99%  Weight:    70 kg (154 lb 5.2 oz)  Height:        General: Pt is alert, awake, not in acute distress Cardiovascular: RRR, S1/S2 +, no rubs, no gallops Respiratory: CTA bilaterally, no wheezing, no rhonchi Abdominal: Soft, NT, ND, bowel sounds + Extremities: Left AKA wound VAC in place   The results of significant diagnostics from this hospitalization (including imaging, microbiology, ancillary and laboratory) are listed below for reference.     Microbiology: Recent Results (from the past 240 hour(s))  MRSA PCR Screening     Status: None   Collection Time: 01/10/17  8:53 PM  Result Value Ref Range Status   MRSA by PCR NEGATIVE NEGATIVE Final    Comment:        The GeneXpert MRSA Assay (FDA approved for NASAL specimens only), is one component of a comprehensive MRSA colonization surveillance program. It is not intended to diagnose MRSA infection nor to guide or monitor treatment for MRSA infections.   Surgical PCR screen     Status: Abnormal   Collection Time: 01/12/17  3:11 AM  Result Value Ref Range Status   MRSA, PCR NEGATIVE NEGATIVE Final   Staphylococcus aureus POSITIVE (A) NEGATIVE Final    Comment: (NOTE) The Xpert SA Assay (FDA approved for NASAL specimens in patients 94 years of age and older), is one component of a comprehensive surveillance program. It is not intended to diagnose infection nor to guide or monitor treatment.   Aerobic/Anaerobic Culture (surgical/deep wound)     Status: None   Collection Time: 01/12/17 10:43 AM  Result Value Ref Range Status   Specimen  Description SYNOVIAL LEFT KNEE  Final   Special Requests SPEC A ON SWABS  Final   Gram Stain   Final    RARE WBC PRESENT, PREDOMINANTLY MONONUCLEAR FEW GRAM POSITIVE COCCI IN PAIRS    Culture FEW STAPHYLOCOCCUS AUREUS NO ANAEROBES ISOLATED   Final   Report Status 01/17/2017 FINAL  Final   Organism ID, Bacteria STAPHYLOCOCCUS AUREUS  Final      Susceptibility   Staphylococcus aureus - MIC*    CIPROFLOXACIN <=0.5 SENSITIVE Sensitive     ERYTHROMYCIN >=8 RESISTANT Resistant     GENTAMICIN <=0.5 SENSITIVE Sensitive     OXACILLIN 0.5 SENSITIVE Sensitive     TETRACYCLINE <=1 SENSITIVE Sensitive     VANCOMYCIN 1 SENSITIVE Sensitive     TRIMETH/SULFA <=10 SENSITIVE Sensitive     CLINDAMYCIN <=0.25 SENSITIVE Sensitive     RIFAMPIN <=0.5 SENSITIVE Sensitive     Inducible Clindamycin NEGATIVE Sensitive     * FEW STAPHYLOCOCCUS AUREUS  Fungus Culture With Stain     Status: None (Preliminary result)   Collection Time: 01/12/17 10:43 AM  Result Value Ref Range Status   Fungus Stain Final report  Final    Comment: (NOTE) Performed At: [facility] [place], [place] [id number] Rush Farmer MD JM:[id number]    Fungus (Mycology) Culture PENDING  Incomplete   Fungal Source SYNOVIAL  Final    Comment: LEFT KNEE   Fungus Culture Result     Status: None   Collection Time: 01/12/17 10:43 AM  Result Value Ref Range Status   Result 1 Comment  Final    Comment: (NOTE) KOH/Calcofluor preparation:  no fungus observed. Performed At: [facility] [place], [place] [id number] Rush Farmer MD JH:[id number]  Labs: BNP (last 3 results) No results for input(s): BNP in the last 8760 hours. Basic Metabolic Panel: Recent Labs  Lab 01/13/17 0800 01/15/17 0530 01/17/17 1345 01/19/17 0742  NA 133* 135 137 134*  K 4.3 4.1 3.8 3.9  CL 93* 94* 95* 96*  CO2 28 28 30 25   GLUCOSE 114* 111* 151* 105*  BUN 46* 33* 29* 31*  CREATININE 6.58* 5.68*  5.81* 5.57*  CALCIUM 8.6* 9.6 10.4* 9.3  PHOS 5.7*  --  4.2 5.5*   Liver Function Tests: Recent Labs  Lab 01/13/17 0800 01/17/17 1345 01/19/17 0742  ALBUMIN 2.2* 2.4* 2.2*   No results for input(s): LIPASE, AMYLASE in the last 168 hours. No results for input(s): AMMONIA in the last 168 hours. CBC: Recent Labs  Lab 01/13/17 0800 01/15/17 0530 01/17/17 1345 01/19/17 0742  WBC 5.8 7.4 11.2* 9.9  HGB 9.0* 9.3* 10.3* 8.4*  HCT 29.9* 31.5* 33.8* 27.4*  MCV 91.4 90.3 89.4 89.5  PLT 171 213 249 248   Cardiac Enzymes: No results for input(s): CKTOTAL, CKMB, CKMBINDEX, TROPONINI in the last 168 hours. BNP: Invalid input(s): POCBNP CBG: Recent Labs  Lab 01/18/17 1142 01/18/17 1720 01/18/17 2121 01/19/17 0621 01/19/17 1259  GLUCAP 132* 175* 72 103* 108*   D-Dimer No results for input(s): DDIMER in the last 72 hours. Hgb A1c No results for input(s): HGBA1C in the last 72 hours. Lipid Profile No results for input(s): CHOL, HDL, LDLCALC, TRIG, CHOLHDL, LDLDIRECT in the last 72 hours. Thyroid function studies No results for input(s): TSH, T4TOTAL, T3FREE, THYROIDAB in the last 72 hours.  Invalid input(s): FREET3 Anemia work up No results for input(s): VITAMINB12, FOLATE, FERRITIN, TIBC, IRON, RETICCTPCT in the last 72 hours. Urinalysis    Component Value Date/Time   COLORURINE YELLOW 01/03/2012 1335   APPEARANCEUR Clear 07/12/2013 0918   LABSPEC 1.021 01/03/2012 1335   PHURINE 6.0 01/03/2012 1335   GLUCOSEU Trace (A) 07/12/2013 0918   HGBUR TRACE (A) 01/03/2012 1335   BILIRUBINUR Negative 07/12/2013 0918   KETONESUR NEGATIVE 01/03/2012 1335   PROTEINUR 3+ (A) 07/12/2013 0918   PROTEINUR >300 (A) 01/03/2012 1335   UROBILINOGEN 0.2 01/03/2012 1335   NITRITE Negative 07/12/2013 0918   NITRITE NEGATIVE 01/03/2012 1335   LEUKOCYTESUR Negative 07/12/2013 0918   Sepsis Labs Invalid input(s): PROCALCITONIN,  WBC,  LACTICIDVEN Microbiology Recent Results (from the  past 240 hour(s))  MRSA PCR Screening     Status: None   Collection Time: 01/10/17  8:53 PM  Result Value Ref Range Status   MRSA by PCR NEGATIVE NEGATIVE Final    Comment:        The GeneXpert MRSA Assay (FDA approved for NASAL specimens only), is one component of a comprehensive MRSA colonization surveillance program. It is not intended to diagnose MRSA infection nor to guide or monitor treatment for MRSA infections.   Surgical PCR screen     Status: Abnormal   Collection Time: 01/12/17  3:11 AM  Result Value Ref Range Status   MRSA, PCR NEGATIVE NEGATIVE Final   Staphylococcus aureus POSITIVE (A) NEGATIVE Final    Comment: (NOTE) The Xpert SA Assay (FDA approved for NASAL specimens in patients 53 years of age and older), is one component of a comprehensive surveillance program. It is not intended to diagnose infection nor to guide or monitor treatment.   Aerobic/Anaerobic Culture (surgical/deep wound)     Status: None   Collection Time: 01/12/17 10:43 AM  Result Value Ref Range Status  Specimen Description SYNOVIAL LEFT KNEE  Final   Special Requests SPEC A ON SWABS  Final   Gram Stain   Final    RARE WBC PRESENT, PREDOMINANTLY MONONUCLEAR FEW GRAM POSITIVE COCCI IN PAIRS    Culture FEW STAPHYLOCOCCUS AUREUS NO ANAEROBES ISOLATED   Final   Report Status 01/17/2017 FINAL  Final   Organism ID, Bacteria STAPHYLOCOCCUS AUREUS  Final      Susceptibility   Staphylococcus aureus - MIC*    CIPROFLOXACIN <=0.5 SENSITIVE Sensitive     ERYTHROMYCIN >=8 RESISTANT Resistant     GENTAMICIN <=0.5 SENSITIVE Sensitive     OXACILLIN 0.5 SENSITIVE Sensitive     TETRACYCLINE <=1 SENSITIVE Sensitive     VANCOMYCIN 1 SENSITIVE Sensitive     TRIMETH/SULFA <=10 SENSITIVE Sensitive     CLINDAMYCIN <=0.25 SENSITIVE Sensitive     RIFAMPIN <=0.5 SENSITIVE Sensitive     Inducible Clindamycin NEGATIVE Sensitive     * FEW STAPHYLOCOCCUS AUREUS  Fungus Culture With Stain     Status: None  (Preliminary result)   Collection Time: 01/12/17 10:43 AM  Result Value Ref Range Status   Fungus Stain Final report  Final    Comment: (NOTE) Performed At: [facility] [place], [place] [id number] Rush Farmer MD [id number]    Fungus (Mycology) Culture PENDING  Incomplete   Fungal Source SYNOVIAL  Final    Comment: LEFT KNEE   Fungus Culture Result     Status: None   Collection Time: 01/12/17 10:43 AM  Result Value Ref Range Status   Result 1 Comment  Final    Comment: (NOTE) KOH/Calcofluor preparation:  no fungus observed. Performed At: [facility] [place], [place] [id number] Rush Farmer MD HW:[id number]    Time coordinating discharge: 32 minutes  SIGNED:  Chipper Oman, MD  [facility] 01/19/2017, 2:27 PM  Pager please text page via  [email or web address] Password [email or web address]

## 2017-01-19 NOTE — Plan of Care (Signed)
  Adequate for Discharge Health Behavior/Discharge Planning: Ability to manage health-related needs will improve 01/19/2017 1533 - Adequate for Discharge by Rance Muir, RN Clinical Measurements: Ability to maintain clinical measurements within normal limits will improve 01/19/2017 1533 - Adequate for Discharge by Rance Muir, RN Will remain free from infection 01/19/2017 1533 - Adequate for Discharge by Rance Muir, RN Diagnostic test results will improve 01/19/2017 1533 - Adequate for Discharge by Rance Muir, RN Respiratory complications will improve 01/19/2017 1533 - Adequate for Discharge by Rance Muir, RN Cardiovascular complication will be avoided 01/19/2017 1533 - Adequate for Discharge by Rance Muir, RN Activity: Risk for activity intolerance will decrease 01/19/2017 1533 - Adequate for Discharge by Rance Muir, RN Coping: Level of anxiety will decrease 01/19/2017 1533 - Adequate for Discharge by Rance Muir, RN Elimination: Will not experience complications related to bowel motility 01/19/2017 1533 - Adequate for Discharge by Rance Muir, RN Will not experience complications related to urinary retention 01/19/2017 1533 - Adequate for Discharge by Rance Muir, RN Pain Managment: General experience of comfort will improve 01/19/2017 1533 - Adequate for Discharge by Rance Muir, RN Safety: Ability to remain free from injury will improve 01/19/2017 1533 - Adequate for Discharge by Rance Muir, RN Activity: Ability to perform//tolerate increased activity and mobilize with assistive devices will improve 01/19/2017 1533 - Adequate for Discharge by Rance Muir, RN Clinical Measurements: Postoperative complications will be avoided or minimized 01/19/2017 1533 - Adequate for Discharge by Rance Muir, RN Self-Care: Ability to meet self-care needs will improve 01/19/2017 1533 - Adequate for Discharge by Rance Muir, RN Self-Concept: Ability to maintain and perform role responsibilities to the fullest extent possible will  improve 01/19/2017 1533 - Adequate for Discharge by Rance Muir, RN Pain Management: Pain level will decrease with appropriate interventions 01/19/2017 1533 - Adequate for Discharge by Rance Muir, RN

## 2017-01-19 NOTE — Clinical Social Work Placement (Addendum)
Nurse to call report to [phone], Room 128B  Transport set up for 5:30 PM.    CLINICAL SOCIAL WORK PLACEMENT  NOTE  Date:  01/19/2017  Patient Details  Name: Matthew Kline MRN: [id number] Date of Birth: [birth year]  Clinical Social Work is seeking post-discharge placement for this patient at the [facility] level of care (*CSW will initial, date and re-position this form in  chart as items are completed):  Yes   Patient/family provided with [facility] Work Department's list of facilities offering this level of care within the geographic area requested by the patient (or if unable, by the patient's family).  Yes   Patient/family informed of their freedom to choose among providers that offer the needed level of care, that participate in Medicare, Medicaid or managed care program needed by the patient, have an available bed and are willing to accept the patient.  Yes   Patient/family informed of [facility]'s ownership interest in [facility] and [facility], as well as of the fact that they are under no obligation to receive care at these facilities.  PASRR submitted to EDS on       PASRR number received on 01/17/17     Existing PASRR number confirmed on       FL2 transmitted to all facilities in geographic area requested by pt/family on 01/17/17     FL2 transmitted to all facilities within larger geographic area on       Patient informed that his/her managed care company has contracts with or will negotiate with certain facilities, including the following:        Yes   Patient/family informed of bed offers received.  Patient chooses bed at [facility]     Physician recommends and patient chooses bed at      Patient to be transferred to [facility] on 01/19/17.  Patient to be transferred to facility by [facility]     Patient family notified on 01/19/17 of transfer.  Name of family member notified:  pt responsible  for self     PHYSICIAN Please prepare priority discharge summary, including medications, Please prepare prescriptions     Additional Comment:    _______________________________________________ Geralynn Ochs, LCSW 01/19/2017, 1:30 PM

## 2017-01-19 NOTE — Progress Notes (Signed)
Report called to [employer] and given to [place].

## 2017-01-19 NOTE — Progress Notes (Addendum)
Physical Therapy Treatment Patient Details Name: Matthew Kline MRN: [id number] DOB: 07-22-1960 Today's Date: 01/19/2017    History of Present Illness 57 y.o. male with medical history significant of ESRD on HD(M/W/F), CHF, CAD s/p CABG 05/2016, PAD, CVA, anemia, and chronic ulcer with h/o osteomyelitis of the left foot; severe Charcots deformity of L foot presents after having a fall at home a few days ago after his left knee gave way. He reports falling onto his side with no loss of consciousness or trauma to his head.  However, thereafter patient noted that he was unable to ambulate due to severe pain in the left foot and knee. He was taken to the OR on 1/2 and found to have an abscess and septic joint. 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 Pt frustrated during session however pleasant to work with and appeared to be having difficulty excepting his new level of mobility. Continue to progress as tolerated.   Follow Up Recommendations  CIR;Supervision/Assistance - 24 hour     Equipment Recommendations  Rolling walker with 5" wheels;3in1 (PT)    Recommendations for Other Services       Precautions / Restrictions Precautions Precautions: Fall Precaution Comments: wound vac Restrictions Weight Bearing Restrictions: Yes LLE Weight Bearing: Non weight bearing    Mobility  Bed Mobility Overal bed mobility: Needs Assistance Bed Mobility: Supine to Sit     Supine to sit: Min guard     General bed mobility comments: min guard for safety  Transfers Overall transfer level: Needs assistance Equipment used: Rolling walker (2 wheeled) Transfers: Sit to/from [facility] Sit to Stand: Mod assist Stand pivot transfers: Mod assist       General transfer comment: sit to stands X2; first trial pt felt too unsteady to pivot and sat back down on EOB; second trial pt able  to pivot and take a two steps forward; assist to power up into standing, for balance, and to manage RW when turning; cues for safe hand placement   Ambulation/Gait                 Stairs            Wheelchair Mobility    Modified Rankin (Stroke Patients Only)       Balance Overall balance assessment: History of Falls                                          Cognition Arousal/Alertness: Lethargic Behavior During Therapy: Flat affect Overall Cognitive Status: Within Functional Limits for tasks assessed Area of Impairment: Orientation;Attention;Following commands;Safety/judgement;Problem solving                 Orientation Level: Disoriented to;Time Current Attention Level: Selective   Following Commands: Follows multi-step commands with increased time     Problem Solving: Difficulty sequencing;Requires verbal cues;Decreased initiation General Comments: pt was frustrated during session while mobilizing due to feeling like he still has L foot and feeling helpless about his situation; pt keeping eyes closed most of session and preoccupied by itching       Exercises      General Comments        Pertinent Vitals/Pain Pain Assessment: Faces Faces Pain Scale: Hurts even more Pain Location:  L LE Pain Descriptors / Indicators: Aching;Guarding;Grimacing;Sore;Pressure Pain Intervention(s): Limited activity within patient's tolerance;Monitored during session;Premedicated before session;Repositioned    Home Living                      Prior Function            PT Goals (current goals can now be found in the care plan section) Acute Rehab PT Goals PT Goal Formulation: With patient Time For Goal Achievement: 01/26/17 Potential to Achieve Goals: Good Progress towards PT goals: Progressing toward goals    Frequency    Min 3X/week      PT Plan Current plan remains appropriate    Co-evaluation              AM-PAC  PT "6 Clicks" Daily Activity  Outcome Measure  Difficulty turning over in bed (including adjusting bedclothes, sheets and blankets)?: A Little Difficulty moving from lying on back to sitting on the side of the bed? : A Little Difficulty sitting down on and standing up from a chair with arms (e.g., wheelchair, bedside commode, etc,.)?: Unable Help needed moving to and from a bed to chair (including a wheelchair)?: A Little Help needed walking in hospital room?: Total Help needed climbing 3-5 steps with a railing? : Total 6 Click Score: 12    End of Session Equipment Utilized During Treatment: Gait belt Activity Tolerance: Patient limited by lethargy Patient left: in chair;with call bell/phone within reach Nurse Communication: Mobility status PT Visit Diagnosis: Other abnormalities of gait and mobility (R26.89)     Time: 9811-9147 PT Time Calculation (min) (ACUTE ONLY): 33 min  Charges:  $Therapeutic Activity: 23-37 mins                    G Codes:       Earney Navy, PTA Pager: [phone]     Darliss Cheney 01/19/2017, 4:05 PM

## 2017-01-19 NOTE — Progress Notes (Signed)
Physical Therapy Cancellation Note   01/19/17 1111  PT Visit Information  Last PT Received On 01/19/17  Reason Eval/Treat Not Completed Patient at procedure or test/unavailable. Pt in HD. PT will check on pt later as time allows.    Earney Navy, PTA Pager: [phone]

## 2017-01-19 NOTE — Progress Notes (Signed)
CSW met with patient to discuss discharge. Patient was tearful, upset that no one had told him what was going on, and he had been having multiple issues throughout the day. Patient said that he had not been updated on what was going on with his discharge, he had not seen a doctor today, and he also had not received lunch today nor received towels he had asked for in order to wash himself up. Patient vented his frustrations, and appreciated CSW listening and apologizing for shortcomings of the day today.   CSW provided patient with information requested on the facility location and what to expect with his SNF placement. CSW had received call from Admissions at [facility] that the facility does not provide transportation for the patient to HD; CSW and patient contacted patient's social worker at [facility], Louretta Shorten, and sent her a signed SCAT application for her to complete and file for expedited processing.  CSW signing off.  Laveda Abbe, [place] Clinical Social Worker [phone]

## 2017-01-20 NOTE — Social Work (Signed)
01/20/17 8:29am-CSW called hospital liaison for SNF and discussed the patient. CSW concerned about patient getting to [facility] for dialysis M/W/F given the issues with transport. SNF confirmed that they would make sure patient gets transported to dialysis and would absorb cost if patient unable to have any other transport. CSW advised that previous started process with social worker at [facility] and transport through [facility] may not be set up for tomorrow transport. SNF reiterated that they would ensure patient has transport to dialysis.  CSW will sign off for now.  Elissa Hefty, LCSW Clinical Social Worker [phone]

## 2017-01-28 ENCOUNTER — Other Ambulatory Visit (INDEPENDENT_AMBULATORY_CARE_PROVIDER_SITE_OTHER): Payer: Self-pay | Admitting: Family

## 2017-01-29 ENCOUNTER — Inpatient Hospital Stay (HOSPITAL_COMMUNITY)
Admission: EM | Admit: 2017-01-29 | Discharge: 2017-02-08 | DRG: 070 | Disposition: A | Discharge status: Skilled Nursing Facility | Payer: Medicare HMO | Attending: Internal Medicine | Admitting: Internal Medicine

## 2017-01-29 ENCOUNTER — Other Ambulatory Visit: Payer: Self-pay

## 2017-01-29 ENCOUNTER — Emergency Department (HOSPITAL_COMMUNITY): Payer: Medicare HMO

## 2017-01-29 ENCOUNTER — Encounter (HOSPITAL_COMMUNITY): Payer: Self-pay | Admitting: Emergency Medicine

## 2017-01-29 DIAGNOSIS — N390 Urinary tract infection, site not specified: Secondary | ICD-10-CM

## 2017-01-29 DIAGNOSIS — Z7982 Long term (current) use of aspirin: Secondary | ICD-10-CM

## 2017-01-29 DIAGNOSIS — I6319 Cerebral infarction due to embolism of other precerebral artery: Secondary | ICD-10-CM | POA: Diagnosis not present

## 2017-01-29 DIAGNOSIS — N184 Chronic kidney disease, stage 4 (severe): Secondary | ICD-10-CM | POA: Diagnosis not present

## 2017-01-29 DIAGNOSIS — G4730 Sleep apnea, unspecified: Secondary | ICD-10-CM | POA: Diagnosis present

## 2017-01-29 DIAGNOSIS — N186 End stage renal disease: Secondary | ICD-10-CM | POA: Diagnosis present

## 2017-01-29 DIAGNOSIS — M353 Polymyalgia rheumatica: Secondary | ICD-10-CM | POA: Diagnosis present

## 2017-01-29 DIAGNOSIS — I6340 Cerebral infarction due to embolism of unspecified cerebral artery: Secondary | ICD-10-CM

## 2017-01-29 DIAGNOSIS — R4182 Altered mental status, unspecified: Secondary | ICD-10-CM

## 2017-01-29 DIAGNOSIS — Z7902 Long term (current) use of antithrombotics/antiplatelets: Secondary | ICD-10-CM

## 2017-01-29 DIAGNOSIS — F329 Major depressive disorder, single episode, unspecified: Secondary | ICD-10-CM | POA: Diagnosis present

## 2017-01-29 DIAGNOSIS — I10 Essential (primary) hypertension: Secondary | ICD-10-CM | POA: Diagnosis not present

## 2017-01-29 DIAGNOSIS — Z8673 Personal history of transient ischemic attack (TIA), and cerebral infarction without residual deficits: Secondary | ICD-10-CM

## 2017-01-29 DIAGNOSIS — I2510 Atherosclerotic heart disease of native coronary artery without angina pectoris: Secondary | ICD-10-CM | POA: Diagnosis present

## 2017-01-29 DIAGNOSIS — Z87891 Personal history of nicotine dependence: Secondary | ICD-10-CM

## 2017-01-29 DIAGNOSIS — Z992 Dependence on renal dialysis: Secondary | ICD-10-CM

## 2017-01-29 DIAGNOSIS — K219 Gastro-esophageal reflux disease without esophagitis: Secondary | ICD-10-CM | POA: Diagnosis present

## 2017-01-29 DIAGNOSIS — I6330 Cerebral infarction due to thrombosis of unspecified cerebral artery: Secondary | ICD-10-CM

## 2017-01-29 DIAGNOSIS — R627 Adult failure to thrive: Secondary | ICD-10-CM | POA: Diagnosis present

## 2017-01-29 DIAGNOSIS — I509 Heart failure, unspecified: Secondary | ICD-10-CM | POA: Diagnosis present

## 2017-01-29 DIAGNOSIS — K529 Noninfective gastroenteritis and colitis, unspecified: Secondary | ICD-10-CM | POA: Diagnosis present

## 2017-01-29 DIAGNOSIS — D638 Anemia in other chronic diseases classified elsewhere: Secondary | ICD-10-CM

## 2017-01-29 DIAGNOSIS — I252 Old myocardial infarction: Secondary | ICD-10-CM

## 2017-01-29 DIAGNOSIS — K649 Unspecified hemorrhoids: Secondary | ICD-10-CM | POA: Diagnosis not present

## 2017-01-29 DIAGNOSIS — R451 Restlessness and agitation: Secondary | ICD-10-CM

## 2017-01-29 DIAGNOSIS — I132 Hypertensive heart and chronic kidney disease with heart failure and with stage 5 chronic kidney disease, or end stage renal disease: Secondary | ICD-10-CM | POA: Diagnosis present

## 2017-01-29 DIAGNOSIS — E1129 Type 2 diabetes mellitus with other diabetic kidney complication: Secondary | ICD-10-CM | POA: Diagnosis present

## 2017-01-29 DIAGNOSIS — Z951 Presence of aortocoronary bypass graft: Secondary | ICD-10-CM

## 2017-01-29 DIAGNOSIS — G4700 Insomnia, unspecified: Secondary | ICD-10-CM | POA: Diagnosis present

## 2017-01-29 DIAGNOSIS — E7800 Pure hypercholesterolemia, unspecified: Secondary | ICD-10-CM | POA: Diagnosis present

## 2017-01-29 DIAGNOSIS — E875 Hyperkalemia: Secondary | ICD-10-CM | POA: Diagnosis present

## 2017-01-29 DIAGNOSIS — I5043 Acute on chronic combined systolic (congestive) and diastolic (congestive) heart failure: Secondary | ICD-10-CM | POA: Diagnosis present

## 2017-01-29 DIAGNOSIS — IMO0002 Reserved for concepts with insufficient information to code with codable children: Secondary | ICD-10-CM

## 2017-01-29 DIAGNOSIS — E1122 Type 2 diabetes mellitus with diabetic chronic kidney disease: Secondary | ICD-10-CM | POA: Diagnosis present

## 2017-01-29 DIAGNOSIS — D631 Anemia in chronic kidney disease: Secondary | ICD-10-CM | POA: Diagnosis present

## 2017-01-29 DIAGNOSIS — N2581 Secondary hyperparathyroidism of renal origin: Secondary | ICD-10-CM | POA: Diagnosis present

## 2017-01-29 DIAGNOSIS — E1140 Type 2 diabetes mellitus with diabetic neuropathy, unspecified: Secondary | ICD-10-CM | POA: Diagnosis present

## 2017-01-29 DIAGNOSIS — F419 Anxiety disorder, unspecified: Secondary | ICD-10-CM | POA: Diagnosis present

## 2017-01-29 DIAGNOSIS — Z89612 Acquired absence of left leg above knee: Secondary | ICD-10-CM

## 2017-01-29 DIAGNOSIS — Z79899 Other long term (current) drug therapy: Secondary | ICD-10-CM

## 2017-01-29 DIAGNOSIS — G9341 Metabolic encephalopathy: Principal | ICD-10-CM

## 2017-01-29 DIAGNOSIS — R29701 NIHSS score 1: Secondary | ICD-10-CM | POA: Diagnosis present

## 2017-01-29 DIAGNOSIS — L89899 Pressure ulcer of other site, unspecified stage: Secondary | ICD-10-CM | POA: Diagnosis present

## 2017-01-29 LAB — RAPID URINE DRUG SCREEN, HOSP PERFORMED
AMPHETAMINES: NOT DETECTED
BARBITURATES: NOT DETECTED
Benzodiazepines: POSITIVE — AB
Cocaine: NOT DETECTED
Opiates: NOT DETECTED
TETRAHYDROCANNABINOL: NOT DETECTED

## 2017-01-29 LAB — URINALYSIS, ROUTINE W REFLEX MICROSCOPIC
Bilirubin Urine: NEGATIVE
Glucose, UA: 50 mg/dL — AB
Ketones, ur: NEGATIVE mg/dL
NITRITE: NEGATIVE
PH: 8 (ref 5.0–8.0)
Protein, ur: 100 mg/dL — AB
SPECIFIC GRAVITY, URINE: 1.011 (ref 1.005–1.030)
Squamous Epithelial / LPF: NONE SEEN

## 2017-01-29 LAB — BASIC METABOLIC PANEL
ANION GAP: 15 (ref 5–15)
BUN: 45 mg/dL — ABNORMAL HIGH (ref 6–20)
CALCIUM: 9.4 mg/dL (ref 8.9–10.3)
CO2: 26 mmol/L (ref 22–32)
Chloride: 94 mmol/L — ABNORMAL LOW (ref 101–111)
Creatinine, Ser: 8.08 mg/dL — ABNORMAL HIGH (ref 0.61–1.24)
GFR calc non Af Amer: 7 mL/min — ABNORMAL LOW (ref >60)
GFR, EST AFRICAN AMERICAN: 8 mL/min — AB (ref >60)
Glucose, Bld: 101 mg/dL — ABNORMAL HIGH (ref 65–99)
Potassium: 4.9 mmol/L (ref 3.5–5.1)
Sodium: 135 mmol/L (ref 135–145)

## 2017-01-29 LAB — COMPREHENSIVE METABOLIC PANEL
ALT: 8 U/L — AB (ref 17–63)
AST: 28 U/L (ref 15–41)
Albumin: 2.7 g/dL — ABNORMAL LOW (ref 3.5–5.0)
Alkaline Phosphatase: 97 U/L (ref 38–126)
Anion gap: 17 — ABNORMAL HIGH (ref 5–15)
BUN: 45 mg/dL — AB (ref 6–20)
CO2: 22 mmol/L (ref 22–32)
CREATININE: 7.44 mg/dL — AB (ref 0.61–1.24)
Calcium: 9.4 mg/dL (ref 8.9–10.3)
Chloride: 94 mmol/L — ABNORMAL LOW (ref 101–111)
GFR calc Af Amer: 8 mL/min — ABNORMAL LOW (ref >60)
GFR calc non Af Amer: 7 mL/min — ABNORMAL LOW (ref >60)
Glucose, Bld: 113 mg/dL — ABNORMAL HIGH (ref 65–99)
Potassium: 5.3 mmol/L — ABNORMAL HIGH (ref 3.5–5.1)
SODIUM: 133 mmol/L — AB (ref 135–145)
Total Bilirubin: 1.3 mg/dL — ABNORMAL HIGH (ref 0.3–1.2)
Total Protein: 7.6 g/dL (ref 6.5–8.1)

## 2017-01-29 LAB — CBC WITH DIFFERENTIAL/PLATELET
BASOS ABS: 0 10*3/uL (ref 0.0–0.1)
Basophils Relative: 0 %
EOS ABS: 0.1 10*3/uL (ref 0.0–0.7)
EOS PCT: 1 %
HCT: 29.7 % — ABNORMAL LOW (ref 39.0–52.0)
Hemoglobin: 8.9 g/dL — ABNORMAL LOW (ref 13.0–17.0)
LYMPHS PCT: 13 %
Lymphs Abs: 1.2 10*3/uL (ref 0.7–4.0)
MCH: 27.2 pg (ref 26.0–34.0)
MCHC: 30 g/dL (ref 30.0–36.0)
MCV: 90.8 fL (ref 78.0–100.0)
Monocytes Absolute: 0.7 10*3/uL (ref 0.1–1.0)
Monocytes Relative: 8 %
NEUTROS PCT: 78 %
Neutro Abs: 7.1 10*3/uL (ref 1.7–7.7)
Platelets: 248 10*3/uL (ref 150–400)
RBC: 3.27 MIL/uL — AB (ref 4.22–5.81)
RDW: 17.9 % — ABNORMAL HIGH (ref 11.5–15.5)
WBC: 9.1 10*3/uL (ref 4.0–10.5)

## 2017-01-29 LAB — I-STAT TROPONIN, ED: TROPONIN I, POC: 0.03 ng/mL (ref 0.00–0.08)

## 2017-01-29 LAB — I-STAT CG4 LACTIC ACID, ED: Lactic Acid, Venous: 0.57 mmol/L (ref 0.5–1.9)

## 2017-01-29 LAB — ETHANOL: Alcohol, Ethyl (B): <10 mg/dL (ref <10)

## 2017-01-29 LAB — AMMONIA: Ammonia: 19 umol/L (ref 9–35)

## 2017-01-29 MED ORDER — SEVELAMER CARBONATE 800 MG PO TABS
2400.0000 mg | ORAL_TABLET | Freq: Three times a day (TID) | ORAL | Status: DC
  Filled 2017-01-29: qty 3

## 2017-01-29 MED ORDER — CARVEDILOL 12.5 MG PO TABS: 25.0000 mg | ORAL_TABLET | Freq: Every day | ORAL | Status: DC

## 2017-01-29 MED ORDER — DEXTROSE 5 % IV SOLN
1.0000 g | Freq: Once | INTRAVENOUS | Status: AC
  Administered 2017-01-29: 1 g via INTRAVENOUS
  Filled 2017-01-29: qty 10

## 2017-01-29 MED ORDER — SEVELAMER CARBONATE 800 MG PO TABS: 1600.0000 mg | ORAL_TABLET | ORAL | Status: DC | PRN

## 2017-01-29 MED ORDER — ONDANSETRON HCL 4 MG PO TABS: 4.0000 mg | ORAL_TABLET | Freq: Four times a day (QID) | ORAL | Status: DC | PRN

## 2017-01-29 MED ORDER — PRO-STAT SUGAR FREE PO LIQD
30.0000 mL | Freq: Two times a day (BID) | ORAL | Status: DC
  Administered 2017-02-01 – 2017-02-08 (×13): 30 mL via ORAL
  Filled 2017-01-29 (×14): qty 30

## 2017-01-29 MED ORDER — LORAZEPAM 2 MG/ML IJ SOLN
0.5000 mg | Freq: Once | INTRAMUSCULAR | Status: AC
  Administered 2017-01-29: 0.5 mg via INTRAVENOUS
  Filled 2017-01-29: qty 1

## 2017-01-29 MED ORDER — SODIUM CHLORIDE 0.9% FLUSH
3.0000 mL | Freq: Two times a day (BID) | INTRAVENOUS | Status: DC
  Administered 2017-01-30 – 2017-02-08 (×9): 3 mL via INTRAVENOUS

## 2017-01-29 MED ORDER — CLOPIDOGREL BISULFATE 75 MG PO TABS
75.0000 mg | ORAL_TABLET | Freq: Every day | ORAL | Status: DC
  Administered 2017-02-01 – 2017-02-07 (×7): 75 mg via ORAL
  Filled 2017-01-29 (×7): qty 1

## 2017-01-29 MED ORDER — DEXTROSE 5 % IV SOLN
1.0000 g | INTRAVENOUS | Status: DC
  Administered 2017-01-30 – 2017-02-03 (×5): 1 g via INTRAVENOUS
  Filled 2017-01-29 (×7): qty 10

## 2017-01-29 MED ORDER — AMLODIPINE BESYLATE 5 MG PO TABS: 5.0000 mg | ORAL_TABLET | Freq: Every day | ORAL | Status: DC

## 2017-01-29 MED ORDER — SODIUM CHLORIDE 0.9% FLUSH: 3.0000 mL | INTRAVENOUS | Status: DC | PRN

## 2017-01-29 MED ORDER — SODIUM CHLORIDE 0.9 % IV SOLN: 250.0000 mL | INTRAVENOUS | Status: DC | PRN

## 2017-01-29 MED ORDER — SODIUM CHLORIDE 0.9 % IV BOLUS (SEPSIS)
250.0000 mL | Freq: Once | INTRAVENOUS | Status: AC
  Administered 2017-01-29: 250 mL via INTRAVENOUS

## 2017-01-29 MED ORDER — ONDANSETRON HCL 4 MG/2ML IJ SOLN: 4.0000 mg | Freq: Four times a day (QID) | INTRAMUSCULAR | Status: DC | PRN

## 2017-01-29 MED ORDER — ASPIRIN EC 81 MG PO TBEC
162.0000 mg | DELAYED_RELEASE_TABLET | Freq: Every day | ORAL | Status: DC
  Administered 2017-02-01 – 2017-02-08 (×7): 162 mg via ORAL
  Filled 2017-01-29 (×9): qty 2

## 2017-01-29 MED ORDER — ATORVASTATIN CALCIUM 40 MG PO TABS
40.0000 mg | ORAL_TABLET | Freq: Every day | ORAL | Status: DC
  Administered 2017-02-01 – 2017-02-05 (×5): 40 mg via ORAL
  Filled 2017-01-29 (×5): qty 1

## 2017-01-29 MED ORDER — NEPRO/CARBSTEADY PO LIQD
237.0000 mL | Freq: Two times a day (BID) | ORAL | Status: DC
  Administered 2017-02-01 – 2017-02-08 (×11): 237 mL via ORAL
  Filled 2017-01-29 (×22): qty 237

## 2017-01-29 NOTE — ED Notes (Signed)
Per previous RN phlebotomy was called and drew labs including istat lactic and troponin. At this time phlebotomy was alerted again because test did not result. This time the lab was drawn and this RN walked the [place] lab work to mini lab to ensure results

## 2017-01-29 NOTE — ED Notes (Signed)
Applied condom catheter 11mm to pt. With bed drainage bag attached

## 2017-01-29 NOTE — ED Notes (Signed)
When this nurse received this patient he had not had labs drawn and they were verified art 14:10. patient was transferred at 16:30. Lab was called to come help get blood from this patient.

## 2017-01-29 NOTE — ED Notes (Signed)
Will not give oral meds at this time, pt only arousable to pain and will not stay awake to finish neuro checks. VS stable and will give meds when patient wakes up further

## 2017-01-29 NOTE — ED Provider Notes (Signed)
[facility] 5 MIDWEST Provider Note   CSN: [id number] Arrival date & time: 01/29/17  3845     History   Chief Complaint Chief Complaint  Patient presents with  . Fall    HPI Matthew Kline is a 57 y.o. male.  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  Discharge to rehab facility.  Found on floor after unwitnessed fall.  Patient is unable to contribute to history.  Level 5 caveat. Past Medical History:  Diagnosis Date  . Anemia, unspecified   . Anxiety   . Arthralgia 2010   polyarticular  . Arthritis    "back, knees" (01/10/2017)  . Cancer [facility])    "kidney area" (01/10/2017)  . CHF (congestive heart failure) ([place]) 07/25/2009   denies  . Chronic lower back pain   . Coronary artery disease   . Coughing    pt. reports that he has drainage from sinus infection  . Diabetic foot ulcer ([place])   . Diabetic neuropathy ([place])   . ESRD (end stage renal disease) on dialysis [facility])    started 12/2012; "MWF; [place] "  (01/10/2017)  . GERD (gastroesophageal reflux disease)    hx "before I lost weight", no problem 9 years  . Hemodialysis access site with mature fistula ([place])   . Hemorrhoids, internal 10/2011   small  . High cholesterol   . History of blood transfusion    "related to the anemia"  . Hypertension   . Insomnia, unspecified   . 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 Long term (current) use of anticoagulants   . Myocardial infarction ([place]) 1995  . Orthostatic hypotension   . Osteomyelitis of foot, left, acute ([place])   . Other chronic postoperative pain   . Pneumonia    "probably twice" (01/10/2017)  . Polymyalgia rheumatica ([place])   . Renal insufficiency   . Sleep apnea    "lost weight; no more problem" (01/10/2017)  . Stroke ([place]) 01/10/06   denies residual on 05/09/2014  . Type II diabetes mellitus ([place]) dx'd 1995  . Unspecified hereditary and  idiopathic peripheral neuropathy    feet  . Unspecified osteomyelitis, site unspecified   . Unspecified vitamin D deficiency     Patient Active Problem List   Diagnosis Date Noted  . Acute lower UTI 01/29/2017  . Acute metabolic encephalopathy 36/46/8032  . UTI (urinary tract infection) 01/29/2017  . Quadriceps muscle rupture, left, initial encounter   . Fall 01/09/2017  . Gait disturbance 01/09/2017  . Staphylococcus aureus infection 01/09/2017  . Infection of prosthetic left knee joint ([place]) 01/09/2017  . Fever, unknown origin 11/09/2016  . Critical lower limb ischemia 08/25/2016  . Ulcer of left midfoot with fat layer exposed ([place]) 08/13/2016  . Diabetic ulcer of left midfoot associated with type 2 diabetes mellitus, with fat layer exposed ([place]) 07/25/2016  . Peripheral neuropathy 07/22/2016  . Tobacco abuse 07/22/2016  . CAD in native artery 05/18/2016  . CAD, multiple vessel 05/11/2016  . Positive cardiac stress test 05/11/2016  . Abnormal stress test 04/30/2016  . Pre-transplant evaluation for kidney transplant 04/30/2016  . S/P revision of total knee 11/26/2015  . Pain in the chest   . Acute on chronic diastolic heart failure ([place]) 07/05/2015  . Volume overload 07/04/2015  . Shortness of breath 07/04/2015  . Hypoxemia 07/04/2015  . Elevated troponin   . End-stage renal disease on hemodialysis ([place])   .  Hypervolemia   . 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 Pyogenic bacterial arthritis of knee, left ([place]) 08/07/2014  . Tachycardia 07/24/2014  . Acute upper respiratory infection 07/24/2014  . ESRD on dialysis ([place]) 07/14/2014  . Type II diabetes mellitus ([place]) 07/14/2014  . Anemia in chronic kidney disease 07/14/2014  . Congestive heart failure (CHF) ([place]) 07/13/2014  . Surgical wound dehiscence 05/09/2014  . Dehiscence of closure of skin 05/09/2014  . Total knee replacement status 04/10/2014  . Diabetes mellitus with renal manifestations,  controlled ([place]) 10/24/2013  . Hypertensive renal disease 06/27/2013  . DM type 2 causing vascular disease ([place]) 06/27/2013  . Erectile dysfunction 06/27/2013  . Depression 06/27/2013  . Claudication of left lower extremity ([place]) 12/19/2012  . Essential hypertension, benign 12/19/2012  . Sinusitis, acute maxillary 11/22/2012  . Otitis, externa, infective 11/14/2012  . Leg edema, left 11/14/2012  . End stage renal disease ([place]) 10/02/2012  . Controlled type 2 DM with proteinuria or microalbuminuria 09/19/2012  . GERD (gastroesophageal reflux disease) 09/19/2012  . Leukocytosis 09/19/2012  . Lacunar infarction 08/17/2012  . Polymyalgia rheumatica ([place]) 08/17/2012  . 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 Essential hypertension 05/10/2012  . Vitamin D deficiency 05/10/2012  . Diabetes mellitus due to underlying condition ([place]) 05/10/2012  . Hyperlipidemia LDL goal <100 05/10/2012  . Anemia of chronic disease 05/10/2012  . Screening for prostate cancer 05/10/2012  . Chronic kidney disease (CKD), stage IV (severe) ([place]) 05/10/2012  . Peripheral autonomic neuropathy due to DM ([place]) 05/10/2012  . Callus of foot 05/10/2012  . Urgency of urination 05/10/2012  . Hyperkalemia 05/10/2012  . Candidiasis of the esophagus 10/12/2011  . Internal hemorrhoids without mention of complication 76/73/4193  . Pre-syncope 07/25/2009  . DJD (degenerative joint disease) of cervical spine 02/17/2009    Past Surgical History:  Procedure Laterality Date  . ABDOMINAL AORTOGRAM N/A 08/25/2016   Procedure: ABDOMINAL AORTOGRAM;  Surgeon: Wellington Hampshire, MD;  Location: [place] [facility];  Service: Cardiovascular;  Laterality: N/A;  . AMPUTATION  01/21/2012   Procedure: AMPUTATION RAY;  Surgeon: [name], MD;  Location: [place];  Service: Orthopedics;  Laterality: Left;  Left Foot 4th Ray Amputation  . AMPUTATION Left 05/04/2013   Procedure: AMPUTATION DIGIT;  Surgeon: [name], MD;  Location: [place];   Service: Orthopedics;  Laterality: Left;  Left Great Toe Amputation at [facility]  . AMPUTATION Left 01/14/2017   Procedure: AMPUTATION ABOVE LEFT KNEE;  Surgeon: [name], MD;  Location: [place];  Service: Orthopedics;  Laterality: Left;  . ANTERIOR CERVICAL DECOMP/DISCECTOMY FUSION  02/2011  . BACK SURGERY    . BASCILIC VEIN TRANSPOSITION Left 10/19/2012   Procedure: BASCILIC VEIN TRANSPOSITION;  Surgeon: Serafina Mitchell, MD;  Location: [place];  Service: Vascular;  Laterality: Left;  . CARDIAC CATHETERIZATION     "before bypass"  . CORONARY ARTERY BYPASS GRAFT     x 5 with lima at [place] WITH ANTIBIOTIC SPACERS Left 08/07/2014   Procedure: Replace Left Total Knee Arthroplasty,  Place Antibiotic Spacer;  Surgeon: [name], MD;  Location: [place];  Service: Orthopedics;  Laterality: Left;  . I&D EXTREMITY Left 05/09/2014   Procedure: Irrigation and Debridement Left Knee and Closure of Total Knee Arthroplasty Incision;  Surgeon: [name], MD;  Location: [place];  Service: Orthopedics;  Laterality: Left;  . I&D KNEE WITH POLY EXCHANGE Left 05/31/2014   Procedure: IRRIGATION  AND DEBRIDEMENT LEFT KNEE, PLACE ANTIBIOTIC BEADS,  POLY EXCHANGE;  Surgeon: [name], MD;  Location: [place];  Service: Orthopedics;  Laterality: Left;  . IRRIGATION AND DEBRIDEMENT KNEE Left 01/12/2017   Procedure: IRRIGATION AND DEBRIDEMENT LEFT KNEE;  Surgeon: [name], MD;  Location: [place];  Service: Orthopedics;  Laterality: Left;  . JOINT REPLACEMENT    . KNEE ARTHROSCOPY Left 08-25-2012  . LOWER EXTREMITY ANGIOGRAPHY Left 08/25/2016   Procedure: Lower Extremity Angiography;  Surgeon: Wellington Hampshire, MD;  Location: [place] [facility];  Service: Cardiovascular;  Laterality: Left;  . PERIPHERAL VASCULAR BALLOON ANGIOPLASTY Left 08/25/2016   Procedure: PERIPHERAL VASCULAR BALLOON ANGIOPLASTY;  Surgeon: Wellington Hampshire, MD;  Location: [place] [facility];  Service:  Cardiovascular;  Laterality: Left;  lt peroneal and ant tibial arteries cutting balloon  . REFRACTIVE SURGERY Bilateral   . TOE AMPUTATION Bilateral    "I've lost 7 toes over the last 7 years" (05/09/2014)  . TOE SURGERY Left April 2015   Big toe removed on left foot.  . TONSILLECTOMY    . TOTAL KNEE ARTHROPLASTY Left 04/10/2014   Procedure: TOTAL KNEE ARTHROPLASTY;  Surgeon: [name], MD;  Location: [place];  Service: Orthopedics;  Laterality: Left;  . TOTAL KNEE REVISION Left 10/25/2014   Procedure: LEFT TOTAL KNEE REVISION;  Surgeon: [name], MD;  Location: [place];  Service: Orthopedics;  Laterality: Left;  . TOTAL KNEE REVISION Left 11/26/2015   Procedure: Removal Left Total Knee Arthroplasty, Hinged Total Knee Arthroplasty;  Surgeon: [name], MD;  Location: [place];  Service: Orthopedics;  Laterality: Left;  . UVULOPALATOPHARYNGOPLASTY, TONSILLECTOMY AND SEPTOPLASTY  ~ 1989  . WOUND DEBRIDEMENT Left 05/09/2014   Dehiscence Left Total Knee Arthroplasty Incision       Home Medications    Prior to Admission medications   Medication Sig Start Date End Date Taking? Authorizing Provider  amLODipine (NORVASC) 5 MG tablet Take 5 mg by mouth daily.    Yes [provider]  aspirin EC 81 MG EC tablet Take 2 tablets (162 mg total) by mouth daily. 01/20/17  Yes Patrecia Pour, Christean Grief, MD  atorvastatin (LIPITOR) 40 MG tablet Take 40 mg by mouth at bedtime.    Yes [provider]  baclofen (LIORESAL) 10 MG tablet Take 5 mg by mouth See admin instructions. Take 1/2 tablet (5 mg) by mouth daily at bedtime for muscle spasms for 6 days (order date 01/26/17)   Yes [provider]  carvedilol (COREG) 25 MG tablet Take 25 mg by mouth daily.    Yes [provider]  cephALEXin (KEFLEX) 250 MG capsule Take 1 capsule (250 mg total) by mouth daily for 10 days. Patient taking differently: Take 250 mg by mouth daily. 10 day course started 01/20/17 01/20/17 01/30/17 Yes  Patrecia Pour, Christean Grief, MD  clopidogrel (PLAVIX) 75 MG tablet Take 1 tablet (75 mg total) by mouth daily. 08/25/16 08/25/17 Yes Wellington Hampshire, MD  gabapentin (NEURONTIN) 300 MG capsule TAKE ONE CAPSULE BY MOUTH 3 TIMES A DAY Patient taking differently: TAKE ONE CAPSULE (300 MG) BY MOUTH DAILY AT BEDTIME 01/28/17  Yes Dondra Prader R, NP  montelukast (SINGULAIR) 10 MG tablet TAKE 1 TABLET (10 MG TOTAL) BY MOUTH AT BEDTIME. 09/01/15  Yes Gildardo Cranker, DO  oxyCODONE-acetaminophen (PERCOCET) 5-325 MG tablet Take 1 tablet by mouth every 4 (four) hours as needed. Patient taking differently: Take 1 tablet by mouth every 4 (four) hours as  needed (pain).  01/09/17  Yes Pisciotta, Elmyra Ricks, PA-C  sevelamer carbonate (RENVELA) 800 MG tablet Take 800-3,200 mg by mouth See admin instructions. Take 4 tablets (3200 mg) by mouth three times daily with meals, take 1 tablet (800 mg) with snacks   Yes [provider]  temazepam (RESTORIL) 30 MG capsule Take 30 mg by mouth at bedtime.  11/10/15  Yes [provider]  Amino Acids-Protein Hydrolys (FEEDING SUPPLEMENT, PRO-STAT SUGAR FREE 64,) LIQD Take 30 mLs by mouth 2 (two) times daily. 01/19/17   Doreatha Lew, MD  Nutritional Supplements (FEEDING SUPPLEMENT, NEPRO CARB STEADY,) LIQD Take 237 mLs by mouth 2 (two) times daily between meals. 01/19/17   Doreatha Lew, MD    Family History Family History  Problem Relation Age of Onset  . Hypertension Mother   . Cancer Mother 57       Ovarian  . Heart disease Maternal Aunt   . Stroke Maternal Grandfather     Social History Social History   Tobacco Use  . Smoking status: Former Smoker    Packs/day: 0.12    Years: 32.00    Pack years: 3.84    Types: Cigarettes    Last attempt to quit: 10/05/2016    Years since quitting: 0.3  . Smokeless tobacco: Never Used  Substance Use Topics  . Alcohol use: No    Alcohol/week: 0.0 oz  . Drug use: No     Allergies   Morphine and related and  Tygacil [tigecycline]   Review of Systems Review of Systems  Unable to perform ROS: Mental status change     Physical Exam Updated Vital Signs BP (!) 160/89 (BP Location: Right Arm)   Pulse 87   Temp 97.7 F (36.5 C) (Oral)   Resp 18   Wt 70.4 kg (155 lb 3.3 oz)   SpO2 96%   BMI 21.65 kg/m   Physical Exam  Constitutional: He appears well-developed and well-nourished.  HENT:  Head: Normocephalic and atraumatic.  Mouth/Throat: Oropharynx is clear and moist. No oropharyngeal exudate.  No evidence of any head trauma.  No intraoral trauma.  Eyes: EOM are normal. Pupils are equal, round, and reactive to light.  Pinpoint pupils bilaterally.  Neck:  Cervical collar in place.  Cardiovascular: Normal rate and regular rhythm. Exam reveals no gallop and no friction rub.  No murmur heard. Pulmonary/Chest: Effort normal and breath sounds normal. No stridor. No respiratory distress. He has no wheezes. He has no rales. He exhibits no tenderness.  Abdominal: Soft. Bowel sounds are normal. There is no tenderness. There is no rebound and no guarding.  Musculoskeletal: Normal range of motion. He exhibits no edema or tenderness.  Left above-the-knee amputation.  Dressings intact.  No obvious redness or swelling.  No active bleeding.  Full range of motion of both hips without obvious pain.  No midline thoracic or lumbar tenderness.  Neurological:  Patient frequently crying out.  Intermittently answering questions and following commands.  Appears to move all extremities without focal deficit.  Sensation intact.  Skin: Skin is warm and dry. Capillary refill takes less than 2 seconds. No rash noted. No erythema.  Psychiatric:  Agitation  Nursing note and vitals reviewed.    ED Treatments / Results  Labs (all labs ordered are listed, but only abnormal results are displayed) Labs Reviewed  CBC WITH DIFFERENTIAL/PLATELET - Abnormal; Notable for the following components:      Result Value    RBC 3.27 (*)  Hemoglobin 8.9 (*)    HCT 29.7 (*)    RDW 17.9 (*)    All other components within normal limits  COMPREHENSIVE METABOLIC PANEL - Abnormal; Notable for the following components:   Sodium 133 (*)    Potassium 5.3 (*)    Chloride 94 (*)    Glucose, Bld 113 (*)    BUN 45 (*)    Creatinine, Ser 7.44 (*)    Albumin 2.7 (*)    ALT 8 (*)    Total Bilirubin 1.3 (*)    GFR calc non Af Amer 7 (*)    GFR calc Af Amer 8 (*)    Anion gap 17 (*)    All other components within normal limits  RAPID URINE DRUG SCREEN, HOSP PERFORMED - Abnormal; Notable for the following components:   Benzodiazepines POSITIVE (*)    All other components within normal limits  URINALYSIS, ROUTINE W REFLEX MICROSCOPIC - Abnormal; Notable for the following components:   APPearance CLOUDY (*)    Glucose, UA 50 (*)    Hgb urine dipstick SMALL (*)    Protein, ur 100 (*)    Leukocytes, UA LARGE (*)    Bacteria, UA RARE (*)    All other components within normal limits  BASIC METABOLIC PANEL - Abnormal; Notable for the following components:   Chloride 94 (*)    Glucose, Bld 101 (*)    BUN 45 (*)    Creatinine, Ser 8.08 (*)    GFR calc non Af Amer 7 (*)    GFR calc Af Amer 8 (*)    All other components within normal limits  MRSA PCR SCREENING  URINE CULTURE  GASTROINTESTINAL PANEL BY PCR, STOOL (REPLACES STOOL CULTURE)  ETHANOL  AMMONIA  GLUCOSE, CAPILLARY  GLUCOSE, CAPILLARY  GLUCOSE, CAPILLARY  GLUCOSE, CAPILLARY  BASIC METABOLIC PANEL  CBC  I-STAT CG4 LACTIC ACID, ED  I-STAT TROPONIN, ED    EKG  EKG Interpretation  Date/Time:  Saturday January 29 2017 15:30:33 EST Ventricular Rate:  83 PR Interval:    QRS Duration: 92 QT Interval:  408 QTC Calculation: 480 R Axis:   81 Text Interpretation:  Sinus rhythm Prolonged PR interval Probable left atrial enlargement Borderline prolonged QT interval Confirmed by Julianne Rice [phone]) on 01/30/2017 7:25:56 AM       Radiology Dg Chest  1 View  Result Date: 01/29/2017 CLINICAL DATA:  Pain following fall EXAM: CHEST 1 VIEW COMPARISON:  November 08, 2016 FINDINGS: There is mild interstitial edema. No airspace consolidation. There is cardiomegaly with mild pulmonary venous hypertension. Patient is status post median sternotomy. No adenopathy. There is postoperative change in the lower cervical spine. IMPRESSION: Pulmonary vascular congestion with mild interstitial edema. There may be a degree of congestive heart failure. No consolidation. Electronically Signed   By: Lowella Grip III M.D.   On: 01/29/2017 15:10   Ct Head Wo Contrast  Result Date: 01/29/2017 CLINICAL DATA:  Unwitnessed fall this morning. Cervical spine trauma, high clinical risk. EXAM: CT HEAD WITHOUT CONTRAST CT CERVICAL SPINE WITHOUT CONTRAST TECHNIQUE: Multidetector CT imaging of the head and cervical spine was performed following the standard protocol without intravenous contrast. Multiplanar CT image reconstructions of the cervical spine were also generated. COMPARISON:  Cervical spine CT 09/22/2011 FINDINGS: CT HEAD FINDINGS Brain: No evidence of acute infarction, hemorrhage, hydrocephalus, extra-axial collection or mass lesion/mass effect. Generalized low cerebral volume for age. Small remote left occipital parietal cortex infarct on the left. Vascular: No acute finding.  Atherosclerotic calcification. Skull: Negative for fracture Sinuses/Orbits: No evidence of injury CT CERVICAL SPINE FINDINGS Alignment: No traumatic malalignment. Skull base and vertebrae: Negative for fracture Soft tissues and spinal canal: No prevertebral fluid or swelling. No visible canal hematoma. Prominent carotid atherosclerotic calcification for age. Disc levels: C4-C7 ACDF. Solid arthrodesis at C6-7 and C5-6. Uncertain arthrodesis status at C4-5. No evidence of hardware failure. C3-4 disc bulge and ligamentum flavum thickening causes ventral and dorsal CSF effacement. Upper chest: Negative  IMPRESSION: 1. No evidence of acute intracranial or cervical spine injury. 2. Chronic and postoperative findings are described above. Electronically Signed   By: Monte Fantasia M.D.   On: 01/29/2017 10:07   Ct Cervical Spine Wo Contrast  Result Date: 01/29/2017 CLINICAL DATA:  Unwitnessed fall this morning. Cervical spine trauma, high clinical risk. EXAM: CT HEAD WITHOUT CONTRAST CT CERVICAL SPINE WITHOUT CONTRAST TECHNIQUE: Multidetector CT imaging of the head and cervical spine was performed following the standard protocol without intravenous contrast. Multiplanar CT image reconstructions of the cervical spine were also generated. COMPARISON:  Cervical spine CT 09/22/2011 FINDINGS: CT HEAD FINDINGS Brain: No evidence of acute infarction, hemorrhage, hydrocephalus, extra-axial collection or mass lesion/mass effect. Generalized low cerebral volume for age. Small remote left occipital parietal cortex infarct on the left. Vascular: No acute finding.  Atherosclerotic calcification. Skull: Negative for fracture Sinuses/Orbits: No evidence of injury CT CERVICAL SPINE FINDINGS Alignment: No traumatic malalignment. Skull base and vertebrae: Negative for fracture Soft tissues and spinal canal: No prevertebral fluid or swelling. No visible canal hematoma. Prominent carotid atherosclerotic calcification for age. Disc levels: C4-C7 ACDF. Solid arthrodesis at C6-7 and C5-6. Uncertain arthrodesis status at C4-5. No evidence of hardware failure. C3-4 disc bulge and ligamentum flavum thickening causes ventral and dorsal CSF effacement. Upper chest: Negative IMPRESSION: 1. No evidence of acute intracranial or cervical spine injury. 2. Chronic and postoperative findings are described above. Electronically Signed   By: Monte Fantasia M.D.   On: 01/29/2017 10:07    Procedures Procedures (including critical care time)  Medications Ordered in ED Medications  sevelamer carbonate (RENVELA) tablet 2,400 mg (not administered)    carvedilol (COREG) tablet 25 mg (not administered)  amLODipine (NORVASC) tablet 5 mg (5 mg Oral Not Given 01/30/17 0000)  atorvastatin (LIPITOR) tablet 40 mg (not administered)  clopidogrel (PLAVIX) tablet 75 mg (not administered)  aspirin EC tablet 162 mg (162 mg Oral Not Given 01/30/17 0000)  feeding supplement (NEPRO CARB STEADY) liquid 237 mL (not administered)  feeding supplement (PRO-STAT SUGAR FREE 64) liquid 30 mL (30 mLs Oral Not Given 01/30/17 0001)  sodium chloride flush (NS) 0.9 % injection 3 mL (3 mLs Intravenous Not Given 01/30/17 0124)  sodium chloride flush (NS) 0.9 % injection 3 mL (not administered)  0.9 %  sodium chloride infusion (not administered)  ondansetron (ZOFRAN) tablet 4 mg (not administered)    Or  ondansetron (ZOFRAN) injection 4 mg (not administered)  cefTRIAXone (ROCEPHIN) 1 g in dextrose 5 % 50 mL IVPB (not administered)  sevelamer carbonate (RENVELA) tablet 1,600 mg (not administered)  LORazepam (ATIVAN) injection 0.5 mg (0.5 mg Intravenous Given 01/29/17 0946)  cefTRIAXone (ROCEPHIN) 1 g in dextrose 5 % 50 mL IVPB (0 g Intravenous Stopped 01/29/17 1403)  sodium chloride 0.9 % bolus 250 mL (0 mLs Intravenous Stopped 01/29/17 1630)     Initial Impression / Assessment and Plan / ED Course  I have reviewed the triage vital signs and the nursing notes.  Pertinent labs & imaging results  that were available during my care of the patient were reviewed by me and considered in my medical decision making (see chart for details).     Patient continues to be no altered from baseline mental status.  Wife is at bedside.  He does have a urinary tract infection and will start antibiotics.  CT head without acute findings.  Discussed with hospitalist service who will admit.  Final Clinical Impressions(s) / ED Diagnoses   Final diagnoses:  Acute lower UTI  Agitation    ED Discharge Orders    None       Julianne Rice, MD 01/30/17 [phone]

## 2017-01-29 NOTE — H&P (Signed)
History and Physical    [name] OBS:[id number] DOB: 05/01/1960 DOA: 01/29/2017  PCP: Sandi Mariscal, MD  Patient coming from: Rehab center  Chief Complaint:   Not acting normal  HPI: Matthew Kline is a 57 y.o. 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  His wound culture grew out staph aureus methicillin sensitive.  Patient has been doing well in rehab.  His wife is here providing history.  He has been getting up and lifting weights and doing well up until yesterday.  Yesterday he started having nausea vomiting and diarrhea.  He started to not feel very well and was complaining of some abdominal cramps.  Since then he has been more confused than usual which his wife states is very unusual.  She does not know if he has been on more pain meds and she has had difficulty in the rehab center telling her what pain meds he is actually on.  She does not know he has been running fever.  There is no report from the rehab center that has been running fever.  This morning he tried to get up and he fell in his wound started bleeding some.  Therefore he was sent to the emergency department for further evaluation.  Patient seems drowsy but easily arouses to voice.  He is confused.  Patient is referred for admission for altered mental status and UTI.   Review of Systems: As per HPI otherwise 10 point review of systems negative per wife  Past Medical History:  Diagnosis Date  . Anemia, unspecified   . Anxiety   . Arthralgia 2010   polyarticular  . Arthritis    "back, knees" (01/10/2017)  . Cancer [facility])    "kidney area" (01/10/2017)  . CHF (congestive heart failure) ([place]) 07/25/2009   denies  . Chronic lower back pain   . Coronary artery disease   . Coughing    pt. reports that he  has drainage from sinus infection  . Diabetic foot ulcer ([place])   . Diabetic neuropathy ([place])   . ESRD (end stage renal disease) on dialysis [facility])    started 12/2012; "MWF; [place] [facility] "  (01/10/2017)  . GERD (gastroesophageal reflux disease)    hx "before I lost weight", no problem 9 years  . Hemodialysis access site with mature fistula ([place])   . Hemorrhoids, internal 10/2011   small  . High cholesterol   . History of blood transfusion    "related to the anemia"  . Hypertension   . Insomnia, unspecified   . 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 Long term (current) use of anticoagulants   . Myocardial infarction ([place]) 1995  . Orthostatic hypotension   . Osteomyelitis of foot, left, acute ([place])   . Other chronic postoperative pain   . Pneumonia    "probably twice" (01/10/2017)  . Polymyalgia rheumatica ([place])   . Renal insufficiency   . Sleep apnea    "lost weight; no more problem" (01/10/2017)  . Stroke ([place]) 01/10/06   denies residual on 05/09/2014  . Type II diabetes mellitus ([place]) dx'd 1995  . Unspecified hereditary and idiopathic peripheral neuropathy    feet  . Unspecified osteomyelitis, site unspecified   . Unspecified vitamin D deficiency     Past Surgical History:  Procedure Laterality Date  .  ABDOMINAL AORTOGRAM N/A 08/25/2016   Procedure: ABDOMINAL AORTOGRAM;  Surgeon: Wellington Hampshire, MD;  Location: [place] [facility];  Service: Cardiovascular;  Laterality: N/A;  . AMPUTATION  01/21/2012   Procedure: AMPUTATION RAY;  Surgeon: [name], MD;  Location: [place];  Service: Orthopedics;  Laterality: Left;  Left Foot 4th Ray Amputation  . AMPUTATION Left 05/04/2013   Procedure: AMPUTATION DIGIT;  Surgeon: [name], MD;  Location: [place];  Service: Orthopedics;  Laterality: Left;  Left Great Toe Amputation at [facility]  . AMPUTATION Left 01/14/2017   Procedure: AMPUTATION ABOVE LEFT KNEE;  Surgeon: [name], MD;  Location: [place];  Service: Orthopedics;   Laterality: Left;  . ANTERIOR CERVICAL DECOMP/DISCECTOMY FUSION  02/2011  . BACK SURGERY    . BASCILIC VEIN TRANSPOSITION Left 10/19/2012   Procedure: BASCILIC VEIN TRANSPOSITION;  Surgeon: Serafina Mitchell, MD;  Location: [place];  Service: Vascular;  Laterality: Left;  . CARDIAC CATHETERIZATION     "before bypass"  . CORONARY ARTERY BYPASS GRAFT     x 5 with lima at [place] WITH ANTIBIOTIC SPACERS Left 08/07/2014   Procedure: Replace Left Total Knee Arthroplasty,  Place Antibiotic Spacer;  Surgeon: [name], MD;  Location: [place];  Service: Orthopedics;  Laterality: Left;  . I&D EXTREMITY Left 05/09/2014   Procedure: Irrigation and Debridement Left Knee and Closure of Total Knee Arthroplasty Incision;  Surgeon: [name], MD;  Location: [place];  Service: Orthopedics;  Laterality: Left;  . I&D KNEE WITH POLY EXCHANGE Left 05/31/2014   Procedure: IRRIGATION AND DEBRIDEMENT LEFT KNEE, PLACE ANTIBIOTIC BEADS,  POLY EXCHANGE;  Surgeon: [name], MD;  Location: [place];  Service: Orthopedics;  Laterality: Left;  . IRRIGATION AND DEBRIDEMENT KNEE Left 01/12/2017   Procedure: IRRIGATION AND DEBRIDEMENT LEFT KNEE;  Surgeon: [name], MD;  Location: [place];  Service: Orthopedics;  Laterality: Left;  . JOINT REPLACEMENT    . KNEE ARTHROSCOPY Left 08-25-2012  . LOWER EXTREMITY ANGIOGRAPHY Left 08/25/2016   Procedure: Lower Extremity Angiography;  Surgeon: Wellington Hampshire, MD;  Location: [place] [facility];  Service: Cardiovascular;  Laterality: Left;  . PERIPHERAL VASCULAR BALLOON ANGIOPLASTY Left 08/25/2016   Procedure: PERIPHERAL VASCULAR BALLOON ANGIOPLASTY;  Surgeon: Wellington Hampshire, MD;  Location: [place] [facility];  Service: Cardiovascular;  Laterality: Left;  lt peroneal and ant tibial arteries cutting balloon  . REFRACTIVE SURGERY Bilateral   . TOE AMPUTATION Bilateral    "I've lost 7 toes over the last 7 years" (05/09/2014)  . TOE SURGERY  Left April 2015   Big toe removed on left foot.  . TONSILLECTOMY    . TOTAL KNEE ARTHROPLASTY Left 04/10/2014   Procedure: TOTAL KNEE ARTHROPLASTY;  Surgeon: [name], MD;  Location: [place];  Service: Orthopedics;  Laterality: Left;  . TOTAL KNEE REVISION Left 10/25/2014   Procedure: LEFT TOTAL KNEE REVISION;  Surgeon: [name], MD;  Location: [place];  Service: Orthopedics;  Laterality: Left;  . TOTAL KNEE REVISION Left 11/26/2015   Procedure: Removal Left Total Knee Arthroplasty, Hinged Total Knee Arthroplasty;  Surgeon: [name], MD;  Location: [place];  Service: Orthopedics;  Laterality: Left;  . UVULOPALATOPHARYNGOPLASTY, TONSILLECTOMY AND SEPTOPLASTY  ~ 1989  . WOUND DEBRIDEMENT Left 05/09/2014   Dehiscence Left Total Knee Arthroplasty Incision     reports that he quit smoking about 3 months ago. His smoking  use included cigarettes. He has a 3.84 pack-year smoking history. he has never used smokeless tobacco. He reports that he does not drink alcohol or use drugs.  Allergies  Allergen Reactions  . Morphine And Related Other (See Comments)    hallucinations  . Tygacil [Tigecycline] Nausea And Vomiting and Other (See Comments)    Makes him feel crazy    Family History  Problem Relation Age of Onset  . Hypertension Mother   . Cancer Mother 36       Ovarian  . Heart disease Maternal Aunt   . Stroke Maternal Grandfather     Prior to Admission medications   Medication Sig Start Date End Date Taking? Authorizing Provider  Amino Acids-Protein Hydrolys (FEEDING SUPPLEMENT, PRO-STAT SUGAR FREE 64,) LIQD Take 30 mLs by mouth 2 (two) times daily. 01/19/17   Patrecia Pour, Christean Grief, MD  amLODipine (NORVASC) 5 MG tablet Take 5 mg by mouth daily at 6 PM.     [provider]  aspirin EC 81 MG EC tablet Take 2 tablets (162 mg total) by mouth daily. 01/20/17   Doreatha Lew, MD  atorvastatin (LIPITOR) 40 MG tablet Take 40 mg by mouth daily at 6 PM.    [provider]  carvedilol (COREG) 25 MG tablet Take 25 mg by mouth daily at 6 PM.     [provider]  cephALEXin (KEFLEX) 250 MG capsule Take 1 capsule (250 mg total) by mouth daily for 10 days. 01/20/17 01/30/17  Doreatha Lew, MD  clopidogrel (PLAVIX) 75 MG tablet Take 1 tablet (75 mg total) by mouth daily. Patient taking differently: Take 75 mg by mouth daily at 6 PM.  08/25/16 08/25/17  Wellington Hampshire, MD  gabapentin (NEURONTIN) 300 MG capsule TAKE ONE CAPSULE BY MOUTH 3 TIMES A DAY 01/28/17   Suzan Slick, NP  montelukast (SINGULAIR) 10 MG tablet TAKE 1 TABLET (10 MG TOTAL) BY MOUTH AT BEDTIME. 09/01/15   Gildardo Cranker, DO  Nutritional Supplements (FEEDING SUPPLEMENT, NEPRO CARB STEADY,) LIQD Take 237 mLs by mouth 2 (two) times daily between meals. 01/19/17   Doreatha Lew, MD  oxyCODONE-acetaminophen (PERCOCET) 5-325 MG tablet Take 1 tablet by mouth every 4 (four) hours as needed. 01/09/17   Pisciotta, Elmyra Ricks, PA-C  sevelamer carbonate (RENVELA) 800 MG tablet Take 1,600-2,400 mg by mouth 3 (three) times daily with meals. Take 2400 to 3200 mg with each me (based on the size of the meal) 3 times daily and take 800 mg with large snacks    [provider]  temazepam (RESTORIL) 30 MG capsule Take 30 mg by mouth at bedtime.  11/10/15   [provider]    Physical Exam: Vitals:   01/29/17 1315 01/29/17 1330 01/29/17 1345 01/29/17 1415  BP: (!) 150/89 (!) 154/86 (!) 142/87 (!) 147/84  Pulse: 82 84 82 82  Resp: 11 12 17 11   Temp:      TempSrc:      SpO2: 97% 97% 99% 95%      Constitutional: NAD, calm, comfortable drowsy but easily arouses to voice Vitals:   01/29/17 1315 01/29/17 1330 01/29/17 1345 01/29/17 1415  BP: (!) 150/89 (!) 154/86 (!) 142/87 (!) 147/84  Pulse: 82 84 82 82  Resp: 11 12 17 11   Temp:      TempSrc:      SpO2: 97% 97% 99% 95%   Eyes: PERRL, lids and conjunctivae normal ENMT: Mucous membranes are moist. Posterior pharynx clear of any  exudate or lesions.Normal dentition.  Neck: normal, supple, no masses, no thyromegaly Respiratory: clear to auscultation bilaterally, no wheezing, no crackles. Normal respiratory effort. No accessory muscle use.  Cardiovascular: Regular rate and rhythm, no murmurs / rubs / gallops. No extremity edema. 2+ pedal pulses. No carotid bruits.  Abdomen: no tenderness, no masses palpated. No hepatosplenomegaly. Bowel sounds positive.  Musculoskeletal: no clubbing / cyanosis. No joint deformity upper and lower extremities. Good ROM, no contractures. Normal muscle tone.  Except left AKA Skin: no rashes, lesions, ulcers. No induration left stump wound still has staples is closed there is no active bleeding there is no edema there is no erythema looks well healing Neurologic: CN 2-12 grossly intact. Sensation intact, DTR normal. Strength 5/5 in all 4.  Psychiatric: Appears drowsy oriented to person and place  Labs on Admission: I have personally reviewed following labs and imaging studies  CBC: Recent Labs  Lab 01/29/17 0900  WBC 9.1  NEUTROABS 7.1  HGB 8.9*  HCT 29.7*  MCV 90.8  PLT 782   Basic Metabolic Panel: Recent Labs  Lab 01/29/17 0900  NA 133*  K 5.3*  CL 94*  CO2 22  GLUCOSE 113*  BUN 45*  CREATININE 7.44*  CALCIUM 9.4   GFR: Estimated Creatinine Clearance: 10.8 mL/min (A) (by C-G formula based on SCr of 7.44 mg/dL (H)). Liver Function Tests: Recent Labs  Lab 01/29/17 0900  AST 28  ALT 8*  ALKPHOS 97  BILITOT 1.3*  PROT 7.6  ALBUMIN 2.7*   No results for input(s): LIPASE, AMYLASE in the last 168 hours. No results for input(s): AMMONIA in the last 168 hours. Coagulation Profile: No results for input(s): INR, PROTIME in the last 168 hours. Cardiac Enzymes: No results for input(s): CKTOTAL, CKMB, CKMBINDEX, TROPONINI in the last 168 hours. BNP (last 3 results) No results for input(s): PROBNP in the last 8760 hours. HbA1C: No results for input(s): HGBA1C in the last  72 hours. CBG: No results for input(s): GLUCAP in the last 168 hours. Lipid Profile: No results for input(s): CHOL, HDL, LDLCALC, TRIG, CHOLHDL, LDLDIRECT in the last 72 hours. Thyroid Function Tests: No results for input(s): TSH, T4TOTAL, FREET4, T3FREE, THYROIDAB in the last 72 hours. Anemia Panel: No results for input(s): VITAMINB12, FOLATE, FERRITIN, TIBC, IRON, RETICCTPCT in the last 72 hours. Urine analysis:    Component Value Date/Time   COLORURINE YELLOW 01/29/2017 1141   APPEARANCEUR CLOUDY (A) 01/29/2017 1141   APPEARANCEUR Clear 07/12/2013 0918   LABSPEC 1.011 01/29/2017 1141   PHURINE 8.0 01/29/2017 1141   GLUCOSEU 50 (A) 01/29/2017 1141   HGBUR SMALL (A) 01/29/2017 1141   BILIRUBINUR NEGATIVE 01/29/2017 1141   BILIRUBINUR Negative 07/12/2013 0918   KETONESUR NEGATIVE 01/29/2017 1141   PROTEINUR 100 (A) 01/29/2017 1141   UROBILINOGEN 0.2 01/03/2012 1335   NITRITE NEGATIVE 01/29/2017 1141   LEUKOCYTESUR LARGE (A) 01/29/2017 1141   LEUKOCYTESUR Negative 07/12/2013 0918   Sepsis Labs: !!!!!!!!!!!!!!!!!!!!!!!!!!!!!!!!!!!!!!!!!!!! @LABRCNTIP (procalcitonin:4,lacticidven:4) )No results found for this or any previous visit (from the past 240 hour(s)).   Radiological Exams on Admission: Dg Chest 1 View  Result Date: 01/29/2017 CLINICAL DATA:  Pain following fall EXAM: CHEST 1 VIEW COMPARISON:  November 08, 2016 FINDINGS: There is mild interstitial edema. No airspace consolidation. There is cardiomegaly with mild pulmonary venous hypertension. Patient is status post median sternotomy. No adenopathy. There is postoperative change in the lower cervical spine. IMPRESSION: Pulmonary vascular congestion with mild interstitial edema. There may be a degree of congestive heart  failure. No consolidation. Electronically Signed   By: Lowella Grip III M.D.   On: 01/29/2017 15:10   Ct Head Wo Contrast  Result Date: 01/29/2017 CLINICAL DATA:  Unwitnessed fall this morning. Cervical  spine trauma, high clinical risk. EXAM: CT HEAD WITHOUT CONTRAST CT CERVICAL SPINE WITHOUT CONTRAST TECHNIQUE: Multidetector CT imaging of the head and cervical spine was performed following the standard protocol without intravenous contrast. Multiplanar CT image reconstructions of the cervical spine were also generated. COMPARISON:  Cervical spine CT 09/22/2011 FINDINGS: CT HEAD FINDINGS Brain: No evidence of acute infarction, hemorrhage, hydrocephalus, extra-axial collection or mass lesion/mass effect. Generalized low cerebral volume for age. Small remote left occipital parietal cortex infarct on the left. Vascular: No acute finding.  Atherosclerotic calcification. Skull: Negative for fracture Sinuses/Orbits: No evidence of injury CT CERVICAL SPINE FINDINGS Alignment: No traumatic malalignment. Skull base and vertebrae: Negative for fracture Soft tissues and spinal canal: No prevertebral fluid or swelling. No visible canal hematoma. Prominent carotid atherosclerotic calcification for age. Disc levels: C4-C7 ACDF. Solid arthrodesis at C6-7 and C5-6. Uncertain arthrodesis status at C4-5. No evidence of hardware failure. C3-4 disc bulge and ligamentum flavum thickening causes ventral and dorsal CSF effacement. Upper chest: Negative IMPRESSION: 1. No evidence of acute intracranial or cervical spine injury. 2. Chronic and postoperative findings are described above. Electronically Signed   By: Monte Fantasia M.D.   On: 01/29/2017 10:07   Ct Cervical Spine Wo Contrast  Result Date: 01/29/2017 CLINICAL DATA:  Unwitnessed fall this morning. Cervical spine trauma, high clinical risk. EXAM: CT HEAD WITHOUT CONTRAST CT CERVICAL SPINE WITHOUT CONTRAST TECHNIQUE: Multidetector CT imaging of the head and cervical spine was performed following the standard protocol without intravenous contrast. Multiplanar CT image reconstructions of the cervical spine were also generated. COMPARISON:  Cervical spine CT 09/22/2011 FINDINGS:  CT HEAD FINDINGS Brain: No evidence of acute infarction, hemorrhage, hydrocephalus, extra-axial collection or mass lesion/mass effect. Generalized low cerebral volume for age. Small remote left occipital parietal cortex infarct on the left. Vascular: No acute finding.  Atherosclerotic calcification. Skull: Negative for fracture Sinuses/Orbits: No evidence of injury CT CERVICAL SPINE FINDINGS Alignment: No traumatic malalignment. Skull base and vertebrae: Negative for fracture Soft tissues and spinal canal: No prevertebral fluid or swelling. No visible canal hematoma. Prominent carotid atherosclerotic calcification for age. Disc levels: C4-C7 ACDF. Solid arthrodesis at C6-7 and C5-6. Uncertain arthrodesis status at C4-5. No evidence of hardware failure. C3-4 disc bulge and ligamentum flavum thickening causes ventral and dorsal CSF effacement. Upper chest: Negative IMPRESSION: 1. No evidence of acute intracranial or cervical spine injury. 2. Chronic and postoperative findings are described above. Electronically Signed   By: Monte Fantasia M.D.   On: 01/29/2017 10:07    EKG: Pending Test x-ray review shows mild edema or focal infiltrate Chart reviewed Discussed with EDP Message left on nephrology answering machine  Assessment/Plan 57 year old male with delirium of unclear etiology possibly due to from UTI  Principal Problem:   Acute metabolic encephalopathy-patient not running fever and known history of fever.  He has been not having nausea vomiting and diarrhea for the last 24 hours at least.  We will check a GI panel rule out C. difficile.  He does have a UTI for which I will cover him with Rocephin.  His abdominal exam is benign.  CT of his head is negative.  He has no focal neurological deficits.  Check a 12-lead EKG and a troponin level.  Also check a lactic acid level.  We will also check an ammonia level.  We will obtain frequent neurological checks every 2 hours.  Urine drug screen is positive for  benzodiazepines which he got in the ED.  Is negative for opiates so doubt due to to pain meds.  Active Problems:   Acute lower UTI-IV Rocephin urine culture ordered   Essential hypertension-stable   Anemia of chronic disease-stable   Chronic kidney disease (CKD), stage IV (severe) (HCC)-patient's oxygen sats are normal.  He does not appear volume overloaded to be causing his above symptoms.  His potassium levels a little elevated for which I am going to give him a little small IV fluid bolus.  We will repeat a BMP later this evening.  Do not think he needs emergent dialysis at this time.  Have left a nephrology voicemail for chronic dialysis patient.   Hyperkalemia-as above   Diabetes mellitus with renal manifestations, controlled (HCC)-monitor glucose every 2 hours.  Hold insulin products at this time.   End-stage renal disease on hemodialysis (HCC)-as above   CAD, multiple vessel-noted checking twelve-lead EKG and a troponin level.     DVT prophylaxis: SCD on the right Code Status: Full Family Communication: Wife Disposition Plan: Per day team Consults called: None Admission status: Observation   Raymund Manrique A MD [facility]  If 7PM-7AM, please contact night-coverage [email or web address] Password [facility]  01/29/2017, 3:33 PM

## 2017-01-29 NOTE — ED Notes (Signed)
Per EMS when staff found patient this morning his stump was bleeding. On arrival bleeding is controlled. Pt alert to self at this time.

## 2017-01-29 NOTE — ED Triage Notes (Signed)
Pt to ER for evaluation of unwitnessed fall that occurred this morning at [facility] where patient is residing for rehab after left AKA. Pt on dialysis, last treatment Wednesday. VSS per EMS. Pt not answering questions for EMS.

## 2017-01-29 NOTE — ED Notes (Signed)
Patient transported to X-ray 

## 2017-01-30 ENCOUNTER — Inpatient Hospital Stay (HOSPITAL_COMMUNITY): Payer: Medicare HMO

## 2017-01-30 DIAGNOSIS — I10 Essential (primary) hypertension: Secondary | ICD-10-CM | POA: Diagnosis not present

## 2017-01-30 DIAGNOSIS — K219 Gastro-esophageal reflux disease without esophagitis: Secondary | ICD-10-CM | POA: Diagnosis present

## 2017-01-30 DIAGNOSIS — Z992 Dependence on renal dialysis: Secondary | ICD-10-CM | POA: Diagnosis not present

## 2017-01-30 DIAGNOSIS — E1140 Type 2 diabetes mellitus with diabetic neuropathy, unspecified: Secondary | ICD-10-CM | POA: Diagnosis present

## 2017-01-30 DIAGNOSIS — E1122 Type 2 diabetes mellitus with diabetic chronic kidney disease: Secondary | ICD-10-CM | POA: Diagnosis present

## 2017-01-30 DIAGNOSIS — E875 Hyperkalemia: Secondary | ICD-10-CM | POA: Diagnosis present

## 2017-01-30 DIAGNOSIS — I252 Old myocardial infarction: Secondary | ICD-10-CM | POA: Diagnosis not present

## 2017-01-30 DIAGNOSIS — I509 Heart failure, unspecified: Secondary | ICD-10-CM | POA: Diagnosis present

## 2017-01-30 DIAGNOSIS — K529 Noninfective gastroenteritis and colitis, unspecified: Secondary | ICD-10-CM | POA: Diagnosis present

## 2017-01-30 DIAGNOSIS — F419 Anxiety disorder, unspecified: Secondary | ICD-10-CM | POA: Diagnosis present

## 2017-01-30 DIAGNOSIS — Z89612 Acquired absence of left leg above knee: Secondary | ICD-10-CM | POA: Diagnosis not present

## 2017-01-30 DIAGNOSIS — I6330 Cerebral infarction due to thrombosis of unspecified cerebral artery: Secondary | ICD-10-CM | POA: Diagnosis not present

## 2017-01-30 DIAGNOSIS — M353 Polymyalgia rheumatica: Secondary | ICD-10-CM | POA: Diagnosis present

## 2017-01-30 DIAGNOSIS — N2581 Secondary hyperparathyroidism of renal origin: Secondary | ICD-10-CM | POA: Diagnosis present

## 2017-01-30 DIAGNOSIS — I2510 Atherosclerotic heart disease of native coronary artery without angina pectoris: Secondary | ICD-10-CM | POA: Diagnosis present

## 2017-01-30 DIAGNOSIS — G9340 Encephalopathy, unspecified: Secondary | ICD-10-CM | POA: Diagnosis not present

## 2017-01-30 DIAGNOSIS — R451 Restlessness and agitation: Secondary | ICD-10-CM | POA: Diagnosis present

## 2017-01-30 DIAGNOSIS — L8990 Pressure ulcer of unspecified site, unspecified stage: Secondary | ICD-10-CM | POA: Insufficient documentation

## 2017-01-30 DIAGNOSIS — Z951 Presence of aortocoronary bypass graft: Secondary | ICD-10-CM | POA: Diagnosis not present

## 2017-01-30 DIAGNOSIS — G4730 Sleep apnea, unspecified: Secondary | ICD-10-CM | POA: Diagnosis present

## 2017-01-30 DIAGNOSIS — G9341 Metabolic encephalopathy: Secondary | ICD-10-CM | POA: Diagnosis present

## 2017-01-30 DIAGNOSIS — N390 Urinary tract infection, site not specified: Secondary | ICD-10-CM | POA: Diagnosis not present

## 2017-01-30 DIAGNOSIS — G4700 Insomnia, unspecified: Secondary | ICD-10-CM | POA: Diagnosis present

## 2017-01-30 DIAGNOSIS — E7800 Pure hypercholesterolemia, unspecified: Secondary | ICD-10-CM | POA: Diagnosis present

## 2017-01-30 DIAGNOSIS — D631 Anemia in chronic kidney disease: Secondary | ICD-10-CM | POA: Diagnosis present

## 2017-01-30 DIAGNOSIS — D638 Anemia in other chronic diseases classified elsewhere: Secondary | ICD-10-CM | POA: Diagnosis not present

## 2017-01-30 DIAGNOSIS — I5043 Acute on chronic combined systolic (congestive) and diastolic (congestive) heart failure: Secondary | ICD-10-CM | POA: Diagnosis present

## 2017-01-30 DIAGNOSIS — I6319 Cerebral infarction due to embolism of other precerebral artery: Secondary | ICD-10-CM | POA: Diagnosis not present

## 2017-01-30 DIAGNOSIS — Z8673 Personal history of transient ischemic attack (TIA), and cerebral infarction without residual deficits: Secondary | ICD-10-CM | POA: Diagnosis not present

## 2017-01-30 DIAGNOSIS — N186 End stage renal disease: Secondary | ICD-10-CM | POA: Diagnosis present

## 2017-01-30 DIAGNOSIS — I132 Hypertensive heart and chronic kidney disease with heart failure and with stage 5 chronic kidney disease, or end stage renal disease: Secondary | ICD-10-CM | POA: Diagnosis present

## 2017-01-30 LAB — MRSA PCR SCREENING: MRSA by PCR: NEGATIVE

## 2017-01-30 LAB — BASIC METABOLIC PANEL
Anion gap: 17 — ABNORMAL HIGH (ref 5–15)
BUN: 52 mg/dL — AB (ref 6–20)
CO2: 26 mmol/L (ref 22–32)
Calcium: 9.6 mg/dL (ref 8.9–10.3)
Chloride: 95 mmol/L — ABNORMAL LOW (ref 101–111)
Creatinine, Ser: 8.55 mg/dL — ABNORMAL HIGH (ref 0.61–1.24)
GFR calc Af Amer: 7 mL/min — ABNORMAL LOW (ref >60)
GFR calc non Af Amer: 6 mL/min — ABNORMAL LOW (ref >60)
GLUCOSE: 78 mg/dL (ref 65–99)
POTASSIUM: 5.4 mmol/L — AB (ref 3.5–5.1)
Sodium: 138 mmol/L (ref 135–145)

## 2017-01-30 LAB — GLUCOSE, CAPILLARY
GLUCOSE-CAPILLARY: 72 mg/dL (ref 65–99)
GLUCOSE-CAPILLARY: 75 mg/dL (ref 65–99)
GLUCOSE-CAPILLARY: 80 mg/dL (ref 65–99)
Glucose-Capillary: 73 mg/dL (ref 65–99)
Glucose-Capillary: 73 mg/dL (ref 65–99)
Glucose-Capillary: 77 mg/dL (ref 65–99)
Glucose-Capillary: 81 mg/dL (ref 65–99)
Glucose-Capillary: 92 mg/dL (ref 65–99)
Glucose-Capillary: 97 mg/dL (ref 65–99)

## 2017-01-30 LAB — URINE CULTURE: Culture: NO GROWTH

## 2017-01-30 LAB — CBC
HEMATOCRIT: 32.2 % — AB (ref 39.0–52.0)
Hemoglobin: 9.9 g/dL — ABNORMAL LOW (ref 13.0–17.0)
MCH: 27.9 pg (ref 26.0–34.0)
MCHC: 30.7 g/dL (ref 30.0–36.0)
MCV: 90.7 fL (ref 78.0–100.0)
Platelets: 230 10*3/uL (ref 150–400)
RBC: 3.55 MIL/uL — ABNORMAL LOW (ref 4.22–5.81)
RDW: 17.4 % — AB (ref 11.5–15.5)
WBC: 9.9 10*3/uL (ref 4.0–10.5)

## 2017-01-30 MED ORDER — CARVEDILOL 12.5 MG PO TABS
12.5000 mg | ORAL_TABLET | Freq: Two times a day (BID) | ORAL | Status: DC
  Administered 2017-02-01 – 2017-02-08 (×15): 12.5 mg via ORAL
  Filled 2017-01-30 (×15): qty 1

## 2017-01-30 MED ORDER — HYDRALAZINE HCL 20 MG/ML IJ SOLN
10.0000 mg | Freq: Four times a day (QID) | INTRAMUSCULAR | Status: DC | PRN
  Administered 2017-01-30 – 2017-01-31 (×3): 10 mg via INTRAVENOUS
  Filled 2017-01-30 (×3): qty 1

## 2017-01-30 MED ORDER — FERRIC CITRATE 1 GM 210 MG(FE) PO TABS
420.0000 mg | ORAL_TABLET | Freq: Three times a day (TID) | ORAL | Status: DC
  Administered 2017-02-01 – 2017-02-08 (×18): 420 mg via ORAL
  Filled 2017-01-30 (×29): qty 2

## 2017-01-30 MED ORDER — AMLODIPINE BESYLATE 5 MG PO TABS
5.0000 mg | ORAL_TABLET | Freq: Every day | ORAL | Status: DC
  Administered 2017-02-01: 5 mg via ORAL
  Filled 2017-01-30: qty 1

## 2017-01-30 MED ORDER — VANCOMYCIN HCL IN DEXTROSE 750-5 MG/150ML-% IV SOLN
750.0000 mg | INTRAVENOUS | Status: DC
  Administered 2017-01-31: 750 mg via INTRAVENOUS
  Filled 2017-01-30: qty 150

## 2017-01-30 MED ORDER — VANCOMYCIN HCL 10 G IV SOLR
1500.0000 mg | Freq: Once | INTRAVENOUS | Status: AC
  Administered 2017-01-30: 1500 mg via INTRAVENOUS
  Filled 2017-01-30: qty 1500

## 2017-01-30 MED ORDER — SODIUM CHLORIDE 0.9 % IV SOLN
10.0000 mg | INTRAVENOUS | Status: DC
  Administered 2017-01-30 – 2017-01-31 (×2): 10 mg via INTRAVENOUS
  Filled 2017-01-30 (×4): qty 1

## 2017-01-30 MED ORDER — DEXTROSE 10 % IV SOLN
INTRAVENOUS | Status: DC
  Administered 2017-01-30: 1 mL via INTRAVENOUS

## 2017-01-30 MED ORDER — HEPARIN SODIUM (PORCINE) 5000 UNIT/ML IJ SOLN
5000.0000 [IU] | Freq: Three times a day (TID) | INTRAMUSCULAR | Status: DC
  Administered 2017-01-30 – 2017-02-02 (×8): 5000 [IU] via SUBCUTANEOUS
  Filled 2017-01-30 (×9): qty 1

## 2017-01-30 MED ORDER — NALOXONE HCL 0.4 MG/ML IJ SOLN
0.4000 mg | Freq: Once | INTRAMUSCULAR | Status: AC
  Administered 2017-01-30: 0.4 mg via INTRAVENOUS
  Filled 2017-01-30: qty 1

## 2017-01-30 NOTE — Consult Note (Signed)
[facility] Renal Consultation Note    Indication for Consultation:  Management of ESRD/hemodialysis, anemia, hypertension/volume, and secondary hyperparathyroidism. PCP:  HPI: Matthew Kline is a 57 y.o. male with ESRD, HTN, CAD (hx CABG 2018), Type 2 DM, PAD, Hx CVA, and recent L AKA (discharged on 1/9) who was admitted with AMS and fall.  Pt awake, but non-verbal today. Per wife, was doing well s/p recent discharge to rehab. Was completing PO Keflex for MSSA osteo and participating in PT. On Friday she spoke to him and he noted N/V/D. Yesterday, he had a fall at his SNF (found on floor) and had bleeding from his L stump and also noted to be altered and not answering questions. Brought to ED where work-up showed labs with  Na 133, K 5.3, Glu 101, Trop 0.03, LA 0.57, WBC 9.1, Hgb 8.9. Head CT without acute findings. Urinalysis suspicious for UTI. CXR with mild pulm edema. Ammonia level normal. Drug screen + for benzos only. He was started on abx and admitted.  Dialyzes MWF at [facility]. Last HD 1/16 which he completed in entirety and met EDW. No recent AVF issues.  Past Medical History:  Diagnosis Date  . Anemia, unspecified   . Anxiety   . Arthralgia 2010   polyarticular  . Arthritis    "back, knees" (01/10/2017)  . Cancer [facility])    "kidney area" (01/10/2017)  . CHF (congestive heart failure) ([place]) 07/25/2009   denies  . Chronic lower back pain   . Coronary artery disease   . Coughing    pt. reports that he has drainage from sinus infection  . Diabetic foot ulcer ([place])   . Diabetic neuropathy ([place])   . ESRD (end stage renal disease) on dialysis [facility])    started 12/2012; "MWF; [facility] "  (01/10/2017)  . GERD (gastroesophageal reflux disease)    hx "before I lost weight", no problem 9 years  . Hemodialysis access site with mature fistula ([place])   . Hemorrhoids, internal 10/2011   small  . High cholesterol   . History of blood transfusion    "related to the  anemia"  . Hypertension   . Insomnia, unspecified   . 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 Long term (current) use of anticoagulants   . Myocardial infarction ([place]) 1995  . Orthostatic hypotension   . Osteomyelitis of foot, left, acute ([place])   . Other chronic postoperative pain   . Pneumonia    "probably twice" (01/10/2017)  . Polymyalgia rheumatica ([place])   . Renal insufficiency   . Sleep apnea    "lost weight; no more problem" (01/10/2017)  . Stroke ([place]) 01/10/06   denies residual on 05/09/2014  . Type II diabetes mellitus ([place]) dx'd 1995  . Unspecified hereditary and idiopathic peripheral neuropathy    feet  . Unspecified osteomyelitis, site unspecified   . Unspecified vitamin D deficiency    Past Surgical History:  Procedure Laterality Date  . ABDOMINAL AORTOGRAM N/A 08/25/2016   Procedure: ABDOMINAL AORTOGRAM;  Surgeon: Wellington Hampshire, MD;  Location: [place] CV LAB;  Service: Cardiovascular;  Laterality: N/A;  . AMPUTATION  01/21/2012   Procedure: AMPUTATION RAY;  Surgeon: [name], MD;  Location: [place];  Service: Orthopedics;  Laterality: Left;  Left Foot 4th Ray Amputation  . AMPUTATION Left 05/04/2013   Procedure: AMPUTATION DIGIT;  Surgeon: [name], MD;  Location: [place];  Service: Orthopedics;  Laterality: Left;  Left Great Toe  Amputation at [facility]  . AMPUTATION Left 01/14/2017   Procedure: AMPUTATION ABOVE LEFT KNEE;  Surgeon: [name], MD;  Location: [place];  Service: Orthopedics;  Laterality: Left;  . ANTERIOR CERVICAL DECOMP/DISCECTOMY FUSION  02/2011  . BACK SURGERY    . BASCILIC VEIN TRANSPOSITION Left 10/19/2012   Procedure: BASCILIC VEIN TRANSPOSITION;  Surgeon: Serafina Mitchell, MD;  Location: [place];  Service: Vascular;  Laterality: Left;  . CARDIAC CATHETERIZATION     "before bypass"  . CORONARY ARTERY BYPASS GRAFT     x 5 with lima at [place] WITH ANTIBIOTIC SPACERS Left 08/07/2014   Procedure:  Replace Left Total Knee Arthroplasty,  Place Antibiotic Spacer;  Surgeon: [name], MD;  Location: [place];  Service: Orthopedics;  Laterality: Left;  . I&D EXTREMITY Left 05/09/2014   Procedure: Irrigation and Debridement Left Knee and Closure of Total Knee Arthroplasty Incision;  Surgeon: [name], MD;  Location: [place];  Service: Orthopedics;  Laterality: Left;  . I&D KNEE WITH POLY EXCHANGE Left 05/31/2014   Procedure: IRRIGATION AND DEBRIDEMENT LEFT KNEE, PLACE ANTIBIOTIC BEADS,  POLY EXCHANGE;  Surgeon: [name], MD;  Location: [place];  Service: Orthopedics;  Laterality: Left;  . IRRIGATION AND DEBRIDEMENT KNEE Left 01/12/2017   Procedure: IRRIGATION AND DEBRIDEMENT LEFT KNEE;  Surgeon: [name], MD;  Location: [place];  Service: Orthopedics;  Laterality: Left;  . JOINT REPLACEMENT    . KNEE ARTHROSCOPY Left 08-25-2012  . LOWER EXTREMITY ANGIOGRAPHY Left 08/25/2016   Procedure: Lower Extremity Angiography;  Surgeon: Wellington Hampshire, MD;  Location: [place] CV [facility];  Service: Cardiovascular;  Laterality: Left;  . PERIPHERAL VASCULAR BALLOON ANGIOPLASTY Left 08/25/2016   Procedure: PERIPHERAL VASCULAR BALLOON ANGIOPLASTY;  Surgeon: Wellington Hampshire, MD;  Location: [place] CV LAB;  Service: Cardiovascular;  Laterality: Left;  lt peroneal and ant tibial arteries cutting balloon  . REFRACTIVE SURGERY Bilateral   . TOE AMPUTATION Bilateral    "I've lost 7 toes over the last 7 years" (05/09/2014)  . TOE SURGERY Left April 2015   Big toe removed on left foot.  . TONSILLECTOMY    . TOTAL KNEE ARTHROPLASTY Left 04/10/2014   Procedure: TOTAL KNEE ARTHROPLASTY;  Surgeon: [name], MD;  Location: [place];  Service: Orthopedics;  Laterality: Left;  . TOTAL KNEE REVISION Left 10/25/2014   Procedure: LEFT TOTAL KNEE REVISION;  Surgeon: [name], MD;  Location: [place];  Service: Orthopedics;  Laterality: Left;  . TOTAL KNEE REVISION Left 11/26/2015   Procedure: Removal Left Total  Knee Arthroplasty, Hinged Total Knee Arthroplasty;  Surgeon: [name], MD;  Location: [place];  Service: Orthopedics;  Laterality: Left;  . UVULOPALATOPHARYNGOPLASTY, TONSILLECTOMY AND SEPTOPLASTY  ~ 1989  . WOUND DEBRIDEMENT Left 05/09/2014   Dehiscence Left Total Knee Arthroplasty Incision   Family History  Problem Relation Age of Onset  . Hypertension Mother   . Cancer Mother 52       Ovarian  . Heart disease Maternal Aunt   . Stroke Maternal Grandfather    Social History:  reports that he quit smoking about 3 months ago. His smoking use included cigarettes. He has a 3.84 pack-year smoking history. he has never used smokeless tobacco. He reports that he does not drink alcohol or use drugs.  ROS: Unable to obtain. Pt non-verbal at this time.  Physical Exam: Vitals:   01/29/17 2242 01/30/17 0219 01/30/17  0419 01/30/17 0922  BP: (!) 144/84 (!) 174/88 (!) 160/89 (!) 166/85  Pulse: 76 83 87 92  Resp: '18 16 18 18  ' Temp:  97.7 F (36.5 C) 97.7 F (36.5 C) 98 F (36.7 C)  TempSrc:  Oral Oral Oral  SpO2: 100% 97% 96% 96%  Weight: 70.4 kg (155 lb 3.3 oz)        General: Well developed, NAD. Awake, but non speaking.say some words, disoriented, falls back to sleep Head: Normocephalic, atraumatic  DM retinal dz Neck: Supple, seems to have B neck fullness/?parotid gland enlargement. PCL Lungs: Clear bilaterally to auscultation without wheezes, , or rhonchi. Rales L base Heart: RRR; 2/6 systolic murmur Abdomen: Soft, non-tender, non-distended with normoactive bowel sounds. Musculoskeletal:  Strength and tone appear normal for age. Lower extremities: No RLE edema, L AKA stump bandaged (not examined) Neuro: Awake, non interactive.Lethargic , visible myoclonic jerking,isolated Dialysis Access: L AVF + thrill/bruit  Allergies  Allergen Reactions  . Morphine And Related Other (See Comments)    hallucinations  . Tygacil [Tigecycline] Nausea And Vomiting and Other (See Comments)     Makes him feel crazy   Prior to Admission medications   Medication Sig Start Date End Date Taking? Authorizing Provider  amLODipine (NORVASC) 5 MG tablet Take 5 mg by mouth daily.    Yes [provider]  aspirin EC 81 MG EC tablet Take 2 tablets (162 mg total) by mouth daily. 01/20/17  Yes Patrecia Pour, Christean Grief, MD  atorvastatin (LIPITOR) 40 MG tablet Take 40 mg by mouth at bedtime.    Yes [provider]  baclofen (LIORESAL) 10 MG tablet Take 5 mg by mouth See admin instructions. Take 1/2 tablet (5 mg) by mouth daily at bedtime for muscle spasms for 6 days (order date 01/26/17)   Yes [provider]  carvedilol (COREG) 25 MG tablet Take 25 mg by mouth daily.    Yes [provider]  cephALEXin (KEFLEX) 250 MG capsule Take 1 capsule (250 mg total) by mouth daily for 10 days. Patient taking differently: Take 250 mg by mouth daily. 10 day course started 01/20/17 01/20/17 01/30/17 Yes Patrecia Pour, Christean Grief, MD  clopidogrel (PLAVIX) 75 MG tablet Take 1 tablet (75 mg total) by mouth daily. 08/25/16 08/25/17 Yes Wellington Hampshire, MD  gabapentin (NEURONTIN) 300 MG capsule TAKE ONE CAPSULE BY MOUTH 3 TIMES A DAY Patient taking differently: TAKE ONE CAPSULE (300 MG) BY MOUTH DAILY AT BEDTIME 01/28/17  Yes Dondra Prader R, NP  montelukast (SINGULAIR) 10 MG tablet TAKE 1 TABLET (10 MG TOTAL) BY MOUTH AT BEDTIME. 09/01/15  Yes Gildardo Cranker, DO  oxyCODONE-acetaminophen (PERCOCET) 5-325 MG tablet Take 1 tablet by mouth every 4 (four) hours as needed. Patient taking differently: Take 1 tablet by mouth every 4 (four) hours as needed (pain).  01/09/17  Yes Pisciotta, Elmyra Ricks, PA-C  sevelamer carbonate (RENVELA) 800 MG tablet Take 800-3,200 mg by mouth See admin instructions. Take 4 tablets (3200 mg) by mouth three times daily with meals, take 1 tablet (800 mg) with snacks   Yes [provider]  temazepam (RESTORIL) 30 MG capsule Take 30 mg by mouth at bedtime.  11/10/15  Yes  [provider]  Amino Acids-Protein Hydrolys (FEEDING SUPPLEMENT, PRO-STAT SUGAR FREE 64,) LIQD Take 30 mLs by mouth 2 (two) times daily. 01/19/17   Doreatha Lew, MD  Nutritional Supplements (FEEDING SUPPLEMENT, NEPRO CARB STEADY,) LIQD Take 237 mLs by mouth 2 (two) times daily between meals. 01/19/17  Patrecia Pour, Christean Grief, MD   Current Facility-Administered Medications  Medication Dose Route Frequency Provider Last Rate Last Dose  . 0.9 %  sodium chloride infusion  250 mL Intravenous PRN Phillips Grout, MD      . amLODipine (NORVASC) tablet 5 mg  5 mg Oral q1800 Phillips Grout, MD      . aspirin EC tablet 162 mg  162 mg Oral Daily Derrill Kay A, MD      . atorvastatin (LIPITOR) tablet 40 mg  40 mg Oral q1800 Derrill Kay A, MD      . carvedilol (COREG) tablet 25 mg  25 mg Oral q1800 Derrill Kay A, MD      . cefTRIAXone (ROCEPHIN) 1 g in dextrose 5 % 50 mL IVPB  1 g Intravenous Q24H Derrill Kay A, MD      . clopidogrel (PLAVIX) tablet 75 mg  75 mg Oral q1800 Phillips Grout, MD      . feeding supplement (NEPRO CARB STEADY) liquid 237 mL  237 mL Oral BID BM Derrill Kay A, MD      . feeding supplement (PRO-STAT SUGAR FREE 64) liquid 30 mL  30 mL Oral BID Phillips Grout, MD      . ondansetron (ZOFRAN) tablet 4 mg  4 mg Oral Q6H PRN Phillips Grout, MD       Or  . ondansetron (ZOFRAN) injection 4 mg  4 mg Intravenous Q6H PRN Phillips Grout, MD      . sevelamer carbonate (RENVELA) tablet 1,600 mg  1,600 mg Oral PRN Phillips Grout, MD      . sevelamer carbonate (RENVELA) tablet 2,400 mg  2,400 mg Oral TID WC Derrill Kay A, MD      . sodium chloride flush (NS) 0.9 % injection 3 mL  3 mL Intravenous Q12H Derrill Kay A, MD   3 mL at 01/30/17 0958  . sodium chloride flush (NS) 0.9 % injection 3 mL  3 mL Intravenous PRN Phillips Grout, MD       Labs: Basic Metabolic Panel: Recent Labs  Lab 01/29/17 0900 01/29/17 1726 01/30/17 0710  NA 133* 135 138  K 5.3* 4.9 5.4*   CL 94* 94* 95*  CO2 '22 26 26  ' GLUCOSE 113* 101* 78  BUN 45* 45* 52*  CREATININE 7.44* 8.08* 8.55*  CALCIUM 9.4 9.4 9.6   Liver Function Tests: Recent Labs  Lab 01/29/17 0900  AST 28  ALT 8*  ALKPHOS 97  BILITOT 1.3*  PROT 7.6  ALBUMIN 2.7*   Recent Labs  Lab 01/29/17 1726  AMMONIA 19   CBC: Recent Labs  Lab 01/29/17 0900 01/30/17 0710  WBC 9.1 9.9  NEUTROABS 7.1  --   HGB 8.9* 9.9*  HCT 29.7* 32.2*  MCV 90.8 90.7  PLT 248 230   CBG: Recent Labs  Lab 01/30/17 0052 01/30/17 0316 01/30/17 0535 01/30/17 0738 01/30/17 1016  GLUCAP 75 73 73 72 80   Studies/Results: Dg Chest 1 View  Result Date: 01/29/2017 CLINICAL DATA:  Pain following fall EXAM: CHEST 1 VIEW COMPARISON:  November 08, 2016 FINDINGS: There is mild interstitial edema. No airspace consolidation. There is cardiomegaly with mild pulmonary venous hypertension. Patient is status post median sternotomy. No adenopathy. There is postoperative change in the lower cervical spine. IMPRESSION: Pulmonary vascular congestion with mild interstitial edema. There may be a degree of congestive heart failure. No consolidation. Electronically Signed   By: Lowella Grip III M.D.  On: 01/29/2017 15:10   Ct Head Wo Contrast  Result Date: 01/29/2017 CLINICAL DATA:  Unwitnessed fall this morning. Cervical spine trauma, high clinical risk. EXAM: CT HEAD WITHOUT CONTRAST CT CERVICAL SPINE WITHOUT CONTRAST TECHNIQUE: Multidetector CT imaging of the head and cervical spine was performed following the standard protocol without intravenous contrast. Multiplanar CT image reconstructions of the cervical spine were also generated. COMPARISON:  Cervical spine CT 09/22/2011 FINDINGS: CT HEAD FINDINGS Brain: No evidence of acute infarction, hemorrhage, hydrocephalus, extra-axial collection or mass lesion/mass effect. Generalized low cerebral volume for age. Small remote left occipital parietal cortex infarct on the left. Vascular: No  acute finding.  Atherosclerotic calcification. Skull: Negative for fracture Sinuses/Orbits: No evidence of injury CT CERVICAL SPINE FINDINGS Alignment: No traumatic malalignment. Skull base and vertebrae: Negative for fracture Soft tissues and spinal canal: No prevertebral fluid or swelling. No visible canal hematoma. Prominent carotid atherosclerotic calcification for age. Disc levels: C4-C7 ACDF. Solid arthrodesis at C6-7 and C5-6. Uncertain arthrodesis status at C4-5. No evidence of hardware failure. C3-4 disc bulge and ligamentum flavum thickening causes ventral and dorsal CSF effacement. Upper chest: Negative IMPRESSION: 1. No evidence of acute intracranial or cervical spine injury. 2. Chronic and postoperative findings are described above. Electronically Signed   By: Monte Fantasia M.D.   On: 01/29/2017 10:07   Ct Cervical Spine Wo Contrast  Result Date: 01/29/2017 CLINICAL DATA:  Unwitnessed fall this morning. Cervical spine trauma, high clinical risk. EXAM: CT HEAD WITHOUT CONTRAST CT CERVICAL SPINE WITHOUT CONTRAST TECHNIQUE: Multidetector CT imaging of the head and cervical spine was performed following the standard protocol without intravenous contrast. Multiplanar CT image reconstructions of the cervical spine were also generated. COMPARISON:  Cervical spine CT 09/22/2011 FINDINGS: CT HEAD FINDINGS Brain: No evidence of acute infarction, hemorrhage, hydrocephalus, extra-axial collection or mass lesion/mass effect. Generalized low cerebral volume for age. Small remote left occipital parietal cortex infarct on the left. Vascular: No acute finding.  Atherosclerotic calcification. Skull: Negative for fracture Sinuses/Orbits: No evidence of injury CT CERVICAL SPINE FINDINGS Alignment: No traumatic malalignment. Skull base and vertebrae: Negative for fracture Soft tissues and spinal canal: No prevertebral fluid or swelling. No visible canal hematoma. Prominent carotid atherosclerotic calcification for  age. Disc levels: C4-C7 ACDF. Solid arthrodesis at C6-7 and C5-6. Uncertain arthrodesis status at C4-5. No evidence of hardware failure. C3-4 disc bulge and ligamentum flavum thickening causes ventral and dorsal CSF effacement. Upper chest: Negative IMPRESSION: 1. No evidence of acute intracranial or cervical spine injury. 2. Chronic and postoperative findings are described above. Electronically Signed   By: Monte Fantasia M.D.   On: 01/29/2017 10:07    Dialysis Orders:  MWF at [facility], 400/800, EDW 70kg, 2K/2Ca, AVF, heparin 4000 bolus - Mircera 252mg IV q 2 weeks [facility] - Venofer 1061mx 5 ordered (3 given) - Hectoral 57m38mIV q HD - BMM: Auryxia 1/meals.  Assessment/Plan: 1.  AMS: Unclear cause except UTI. Head CT without acute findings.  CXR without pneumonia. Ammonia ok. S/p recent HD, unlikely uremic. Does seem to have some B facial (?parotid gland) swelling. Had N/V/D prior to admit. C.diff pending. ?Viral cause. ? concussion 2. UTI: On Ceftriaxone. Follow. Cx pending. 3.  ESRD:  Usually MWF. Missed last HD, but no strong acute indication for HD today. Will dialyze tomorrow. 4.  Hypertension/volume: BP up, volume on CXR but asymptomatic. Will need EDW lowered. 5.  Anemia: Hgb 9.9. Follow. Use ESA 6.  Metabolic bone disease:  Corr Ca inching up. Will hold VDRA for now, resume Auryxia as binder. 7. Hx L knee osteomyelitis (s/p L AKA): Was on PO Keflex for MSSA, timeline for this unclear per notes "until ortho f/u appt." 8. Type 2 DM: Per primary   Veneta Penton, PA-C 01/30/2017, 10:34 AM  [facility] Pager: [phone] I have seen and examined this patient and agree with the plan of care seen, examined, eval, discussed with extender.  Jeneen Rinks Kenyada Dosch 01/30/2017, 2:17 PM

## 2017-01-30 NOTE — Progress Notes (Signed)
Patient not easily responsive to voice. I had to do a sternal rub to get patient to respond to answer questions. MD ordered a dose of Narcan. Administered Narcan. No response was noted. Patient still not responding to voice. MD notified. Will continue to monitor.

## 2017-01-30 NOTE — Progress Notes (Signed)
CSW following for discharge planning. Patient is recent readmit, with full assessment completed on 01/18/17. [facility]. CSW noting that patient is minimally responsive at this time; will follow up pending medical improvement to see if patient wishes to return to [place] to continue rehab, if still recommended when patient is medically stable.   Of note, patient has a managed Medicare policy, and will need updated PT and OT notes prior to discharge in order to obtain insurance approval for continued rehabilitation, if needed.  CSW will continue to follow.  Laveda Abbe, [place] Clinical Social Worker [phone]

## 2017-01-30 NOTE — Progress Notes (Signed)
PT Cancellation Note  Patient Details Name: Louden Houseworth MRN: [id number] DOB: [birth year]   Cancelled Treatment:    Reason Eval/Treat Not Completed: Fatigue/lethargy limiting ability to participate   Patient only briefly opens eyes to sternal rub. Not following any commands. Will attempt to see later today as schedule permits.   Of note, pt is from a SNF and typically can return to the SNF without a PT evaluation. Social worker could confirm this.    [name], PT Pager [phone] 01/30/2017, 8:58 AM

## 2017-01-30 NOTE — Progress Notes (Signed)
PROGRESS NOTE    Matthew Kline  HWE:[id number] DOB: 12/20/1960 DOA: 01/29/2017 PCP: Sandi Mariscal, MD   Brief Narrative: 57 year old male with chronic osteomyelitis status post left AKA in the beginning of January, ESRD on hemodialysis, chronic anemia, presented from rehab center for a fall and change in mental status.  Patient was discharged from hospital on January 20, 2007 on Keflex by orthopedics.  The wound culture from the surgery going staph which is methicillin sensitive.  He was found to have UTI, started on ceftriaxone and admitted for further evaluation.  Assessment & Plan:   #Altered mental status: Exact etiology unknown.  Likely acute metabolic encephalopathy in the setting of UTI versus related with any pain medication. -Patient opened his eyes with the name today but did not really follow commands.  No significant change in mental status.  The initial CT scan negative.  As per patient's wife, patient has metal in his body therefore unable to do MRI.  Repeating CT scan 24 hours after the first.  -I will send 2 sets of blood culture -Follow-up urine culture, per now continue ceftriaxone.  Given history of recent admission, surgery I will cover empirically with vancomycin. -Lactate not elevated, ammonia level not elevated. -Chest x-ray with no pneumonia. -Fall precaution. -Patient was on pain medication at rehab, I will order a dose of Narcan to see if it helps. -Speech and swallow evaluation. -Currently n.p.o.  Check blood sugar level.  If blood sugar level drops he may need dextrose IV. -Continue to monitor.  I have discussed this with the patient's nurse and patient's wife at bedside.  #ESRD on hemodialysis, pulmonary edema and vascular congestion likely chronic: Nephrology consulted.  Likely hemodialysis tomorrow.  Monitor electrolytes since patient has mildly elevated potassium level.  #Chronic jerking movement of extremities: The patient's wife reported that it runs in the  family and patient has these jerking movement for a long time.  Unchanged.  # Hypertension: Unable to take p.o. medicine.  Hydralazine as needed for the management of hypertension.  #Anemia of chronic kidney disease: Monitor CBC.  Hemoglobin acceptable.  #History of coronary artery disease: Continue aspirin, Coreg, Lipitor, Plavix when able to take orally.  DVT prophylaxis: Heparin subcutaneous Code Status: Full code Family Communication: Discussed patient's wife at bedside Disposition Plan: Currently admitted    Consultants:   Nephrology  Procedures: None Antimicrobials: Vancomycin and ceftriaxone  Subjective: Seen and examined at bedside.  Patient opened eyes with the name but did not really follow commands.  Not in distress.  Wife at bedside.  Review of systems limited.  Objective: Vitals:   01/29/17 2242 01/30/17 0219 01/30/17 0419 01/30/17 0922  BP: (!) 144/84 (!) 174/88 (!) 160/89 (!) 166/85  Pulse: 76 83 87 92  Resp: 18 16 18 18   Temp:  97.7 F (36.5 C) 97.7 F (36.5 C) 98 F (36.7 C)  TempSrc:  Oral Oral Oral  SpO2: 100% 97% 96% 96%  Weight: 70.4 kg (155 lb 3.3 oz)       Intake/Output Summary (Last 24 hours) at 01/30/2017 1133 Last data filed at 01/30/2017 0958 Gross per 24 hour  Intake 303 ml  Output 0 ml  Net 303 ml   Filed Weights   01/29/17 2242  Weight: 70.4 kg (155 lb 3.3 oz)    Examination:  General exam: Appears calm and comfortable  Respiratory system: Clear to auscultation. Respiratory effort normal. No wheezing or crackle Cardiovascular system: S1 & S2 heard, RRR.  No pedal edema. Gastrointestinal  system: Abdomen is nondistended, soft and nontender. Normal bowel sounds heard. Central nervous system: Opens eyes with name only. Skin: No rashes, lesions or ulcers Psychiatry: Judgement and insight unable to assess.    Data Reviewed: I have personally reviewed following labs and imaging studies  CBC: Recent Labs  Lab 01/29/17 0900  01/30/17 0710  WBC 9.1 9.9  NEUTROABS 7.1  --   HGB 8.9* 9.9*  HCT 29.7* 32.2*  MCV 90.8 90.7  PLT 248 867   Basic Metabolic Panel: Recent Labs  Lab 01/29/17 0900 01/29/17 1726 01/30/17 0710  NA 133* 135 138  K 5.3* 4.9 5.4*  CL 94* 94* 95*  CO2 22 26 26   GLUCOSE 113* 101* 78  BUN 45* 45* 52*  CREATININE 7.44* 8.08* 8.55*  CALCIUM 9.4 9.4 9.6   GFR: Estimated Creatinine Clearance: 9.5 mL/min (A) (by C-G formula based on SCr of 8.55 mg/dL (H)). Liver Function Tests: Recent Labs  Lab 01/29/17 0900  AST 28  ALT 8*  ALKPHOS 97  BILITOT 1.3*  PROT 7.6  ALBUMIN 2.7*   No results for input(s): LIPASE, AMYLASE in the last 168 hours. Recent Labs  Lab 01/29/17 1726  AMMONIA 19   Coagulation Profile: No results for input(s): INR, PROTIME in the last 168 hours. Cardiac Enzymes: No results for input(s): CKTOTAL, CKMB, CKMBINDEX, TROPONINI in the last 168 hours. BNP (last 3 results) No results for input(s): PROBNP in the last 8760 hours. HbA1C: No results for input(s): HGBA1C in the last 72 hours. CBG: Recent Labs  Lab 01/30/17 0052 01/30/17 0316 01/30/17 0535 01/30/17 0738 01/30/17 1016  GLUCAP 75 73 73 72 80   Lipid Profile: No results for input(s): CHOL, HDL, LDLCALC, TRIG, CHOLHDL, LDLDIRECT in the last 72 hours. Thyroid Function Tests: No results for input(s): TSH, T4TOTAL, FREET4, T3FREE, THYROIDAB in the last 72 hours. Anemia Panel: No results for input(s): VITAMINB12, FOLATE, FERRITIN, TIBC, IRON, RETICCTPCT in the last 72 hours. Sepsis Labs: Recent Labs  Lab 01/29/17 2020  LATICACIDVEN 0.57    Recent Results (from the past 240 hour(s))  Urine culture     Status: None   Collection Time: 01/29/17 11:41 AM  Result Value Ref Range Status   Specimen Description URINE, CLEAN CATCH  Final   Special Requests NONE  Final   Culture NO GROWTH  Final   Report Status 01/30/2017 FINAL  Final  MRSA PCR Screening     Status: None   Collection Time:  01/30/17 12:06 AM  Result Value Ref Range Status   MRSA by PCR NEGATIVE NEGATIVE Final    Comment:        The GeneXpert MRSA Assay (FDA approved for NASAL specimens only), is one component of a comprehensive MRSA colonization surveillance program. It is not intended to diagnose MRSA infection nor to guide or monitor treatment for MRSA infections.          Radiology Studies: Dg Chest 1 View  Result Date: 01/29/2017 CLINICAL DATA:  Pain following fall EXAM: CHEST 1 VIEW COMPARISON:  November 08, 2016 FINDINGS: There is mild interstitial edema. No airspace consolidation. There is cardiomegaly with mild pulmonary venous hypertension. Patient is status post median sternotomy. No adenopathy. There is postoperative change in the lower cervical spine. IMPRESSION: Pulmonary vascular congestion with mild interstitial edema. There may be a degree of congestive heart failure. No consolidation. Electronically Signed   By: Lowella Grip III M.D.   On: 01/29/2017 15:10   Ct Head Wo Contrast  Result  Date: 01/29/2017 CLINICAL DATA:  Unwitnessed fall this morning. Cervical spine trauma, high clinical risk. EXAM: CT HEAD WITHOUT CONTRAST CT CERVICAL SPINE WITHOUT CONTRAST TECHNIQUE: Multidetector CT imaging of the head and cervical spine was performed following the standard protocol without intravenous contrast. Multiplanar CT image reconstructions of the cervical spine were also generated. COMPARISON:  Cervical spine CT 09/22/2011 FINDINGS: CT HEAD FINDINGS Brain: No evidence of acute infarction, hemorrhage, hydrocephalus, extra-axial collection or mass lesion/mass effect. Generalized low cerebral volume for age. Small remote left occipital parietal cortex infarct on the left. Vascular: No acute finding.  Atherosclerotic calcification. Skull: Negative for fracture Sinuses/Orbits: No evidence of injury CT CERVICAL SPINE FINDINGS Alignment: No traumatic malalignment. Skull base and vertebrae: Negative for  fracture Soft tissues and spinal canal: No prevertebral fluid or swelling. No visible canal hematoma. Prominent carotid atherosclerotic calcification for age. Disc levels: C4-C7 ACDF. Solid arthrodesis at C6-7 and C5-6. Uncertain arthrodesis status at C4-5. No evidence of hardware failure. C3-4 disc bulge and ligamentum flavum thickening causes ventral and dorsal CSF effacement. Upper chest: Negative IMPRESSION: 1. No evidence of acute intracranial or cervical spine injury. 2. Chronic and postoperative findings are described above. Electronically Signed   By: Monte Fantasia M.D.   On: 01/29/2017 10:07   Ct Cervical Spine Wo Contrast  Result Date: 01/29/2017 CLINICAL DATA:  Unwitnessed fall this morning. Cervical spine trauma, high clinical risk. EXAM: CT HEAD WITHOUT CONTRAST CT CERVICAL SPINE WITHOUT CONTRAST TECHNIQUE: Multidetector CT imaging of the head and cervical spine was performed following the standard protocol without intravenous contrast. Multiplanar CT image reconstructions of the cervical spine were also generated. COMPARISON:  Cervical spine CT 09/22/2011 FINDINGS: CT HEAD FINDINGS Brain: No evidence of acute infarction, hemorrhage, hydrocephalus, extra-axial collection or mass lesion/mass effect. Generalized low cerebral volume for age. Small remote left occipital parietal cortex infarct on the left. Vascular: No acute finding.  Atherosclerotic calcification. Skull: Negative for fracture Sinuses/Orbits: No evidence of injury CT CERVICAL SPINE FINDINGS Alignment: No traumatic malalignment. Skull base and vertebrae: Negative for fracture Soft tissues and spinal canal: No prevertebral fluid or swelling. No visible canal hematoma. Prominent carotid atherosclerotic calcification for age. Disc levels: C4-C7 ACDF. Solid arthrodesis at C6-7 and C5-6. Uncertain arthrodesis status at C4-5. No evidence of hardware failure. C3-4 disc bulge and ligamentum flavum thickening causes ventral and dorsal CSF  effacement. Upper chest: Negative IMPRESSION: 1. No evidence of acute intracranial or cervical spine injury. 2. Chronic and postoperative findings are described above. Electronically Signed   By: Monte Fantasia M.D.   On: 01/29/2017 10:07        Scheduled Meds: . amLODipine  5 mg Oral q1800  . aspirin EC  162 mg Oral Daily  . atorvastatin  40 mg Oral q1800  . carvedilol  25 mg Oral q1800  . clopidogrel  75 mg Oral q1800  . feeding supplement (NEPRO CARB STEADY)  237 mL Oral BID BM  . feeding supplement (PRO-STAT SUGAR FREE 64)  30 mL Oral BID  . ferric citrate  420 mg Oral TID WC  . naLOXone (NARCAN)  injection  0.4 mg Intravenous Once  . sodium chloride flush  3 mL Intravenous Q12H   Continuous Infusions: . sodium chloride    . cefTRIAXone (ROCEPHIN)  IV       LOS: 0 days    Dron Tanna Furry, MD [facility] Pager [phone]  If 7PM-7AM, please contact night-coverage [email or web address] Password [facility] 01/30/2017, 11:33 AM

## 2017-01-30 NOTE — Evaluation (Signed)
Clinical/Bedside Swallow Evaluation Patient Details  Name: Vikrant Pryce MRN: [id number] Date of Birth: 1960-08-21  Today's Date: 01/30/2017 Time: SLP Start Time (ACUTE ONLY): 1350 SLP Stop Time (ACUTE ONLY): 1402 SLP Time Calculation (min) (ACUTE ONLY): 12 min  Past Medical History:  Past Medical History:  Diagnosis Date  . Anemia, unspecified   . Anxiety   . Arthralgia 2010   polyarticular  . Arthritis    "back, knees" (01/10/2017)  . Cancer [facility])    "kidney area" (01/10/2017)  . CHF (congestive heart failure) ([place]) 07/25/2009   denies  . Chronic lower back pain   . Coronary artery disease   . Coughing    pt. reports that he has drainage from sinus infection  . Diabetic foot ulcer ([place])   . Diabetic neuropathy ([place])   . ESRD (end stage renal disease) on dialysis [facility])    started 12/2012; "MWF; [place] "  (01/10/2017)  . GERD (gastroesophageal reflux disease)    hx "before I lost weight", no problem 9 years  . Hemodialysis access site with mature fistula ([place])   . Hemorrhoids, internal 10/2011   small  . High cholesterol   . History of blood transfusion    "related to the anemia"  . Hypertension   . Insomnia, unspecified   . 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 Long term (current) use of anticoagulants   . Myocardial infarction ([place]) 1995  . Orthostatic hypotension   . Osteomyelitis of foot, left, acute ([place])   . Other chronic postoperative pain   . Pneumonia    "probably twice" (01/10/2017)  . Polymyalgia rheumatica ([place])   . Renal insufficiency   . Sleep apnea    "lost weight; no more problem" (01/10/2017)  . Stroke ([place]) 01/10/06   denies residual on 05/09/2014  . Type II diabetes mellitus ([place]) dx'd 1995  . Unspecified hereditary and idiopathic peripheral neuropathy    feet  . Unspecified osteomyelitis, site unspecified   . Unspecified vitamin D deficiency    Past Surgical History:  Past Surgical History:  Procedure Laterality Date   . ABDOMINAL AORTOGRAM N/A 08/25/2016   Procedure: ABDOMINAL AORTOGRAM;  Surgeon: Wellington Hampshire, MD;  Location: [place] [facility];  Service: Cardiovascular;  Laterality: N/A;  . AMPUTATION  01/21/2012   Procedure: AMPUTATION RAY;  Surgeon: [name], MD;  Location: [place];  Service: Orthopedics;  Laterality: Left;  Left Foot 4th Ray Amputation  . AMPUTATION Left 05/04/2013   Procedure: AMPUTATION DIGIT;  Surgeon: [name], MD;  Location: [place];  Service: Orthopedics;  Laterality: Left;  Left Great Toe Amputation at [facility]  . AMPUTATION Left 01/14/2017   Procedure: AMPUTATION ABOVE LEFT KNEE;  Surgeon: [name], MD;  Location: [place];  Service: Orthopedics;  Laterality: Left;  . ANTERIOR CERVICAL DECOMP/DISCECTOMY FUSION  02/2011  . BACK SURGERY    . BASCILIC VEIN TRANSPOSITION Left 10/19/2012   Procedure: BASCILIC VEIN TRANSPOSITION;  Surgeon: Serafina Mitchell, MD;  Location: [place];  Service: Vascular;  Laterality: Left;  . CARDIAC CATHETERIZATION     "before bypass"  . CORONARY ARTERY BYPASS GRAFT     x 5 with lima at [place] WITH ANTIBIOTIC SPACERS Left 08/07/2014   Procedure: Replace Left Total Knee Arthroplasty,  Place Antibiotic Spacer;  Surgeon: [name], MD;  Location: [place];  Service: Orthopedics;  Laterality: Left;  . I&D EXTREMITY Left 05/09/2014  Procedure: Irrigation and Debridement Left Knee and Closure of Total Knee Arthroplasty Incision;  Surgeon: [name], MD;  Location: [place];  Service: Orthopedics;  Laterality: Left;  . I&D KNEE WITH POLY EXCHANGE Left 05/31/2014   Procedure: IRRIGATION AND DEBRIDEMENT LEFT KNEE, PLACE ANTIBIOTIC BEADS,  POLY EXCHANGE;  Surgeon: [name], MD;  Location: [place];  Service: Orthopedics;  Laterality: Left;  . IRRIGATION AND DEBRIDEMENT KNEE Left 01/12/2017   Procedure: IRRIGATION AND DEBRIDEMENT LEFT KNEE;  Surgeon: [name], MD;  Location: [place];  Service: Orthopedics;   Laterality: Left;  . JOINT REPLACEMENT    . KNEE ARTHROSCOPY Left 08-25-2012  . LOWER EXTREMITY ANGIOGRAPHY Left 08/25/2016   Procedure: Lower Extremity Angiography;  Surgeon: Wellington Hampshire, MD;  Location: [place] [facility];  Service: Cardiovascular;  Laterality: Left;  . PERIPHERAL VASCULAR BALLOON ANGIOPLASTY Left 08/25/2016   Procedure: PERIPHERAL VASCULAR BALLOON ANGIOPLASTY;  Surgeon: Wellington Hampshire, MD;  Location: [place] [facility];  Service: Cardiovascular;  Laterality: Left;  lt peroneal and ant tibial arteries cutting balloon  . REFRACTIVE SURGERY Bilateral   . TOE AMPUTATION Bilateral    "I've lost 7 toes over the last 7 years" (05/09/2014)  . TOE SURGERY Left April 2015   Big toe removed on left foot.  . TONSILLECTOMY    . TOTAL KNEE ARTHROPLASTY Left 04/10/2014   Procedure: TOTAL KNEE ARTHROPLASTY;  Surgeon: [name], MD;  Location: [place];  Service: Orthopedics;  Laterality: Left;  . TOTAL KNEE REVISION Left 10/25/2014   Procedure: LEFT TOTAL KNEE REVISION;  Surgeon: [name], MD;  Location: [place];  Service: Orthopedics;  Laterality: Left;  . TOTAL KNEE REVISION Left 11/26/2015   Procedure: Removal Left Total Knee Arthroplasty, Hinged Total Knee Arthroplasty;  Surgeon: [name], MD;  Location: [place];  Service: Orthopedics;  Laterality: Left;  . UVULOPALATOPHARYNGOPLASTY, TONSILLECTOMY AND SEPTOPLASTY  ~ 1989  . WOUND DEBRIDEMENT Left 05/09/2014   Dehiscence Left Total Knee Arthroplasty Incision   HPI:  Pt is a 57 y.o. 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 His wound culture grew out staph aureus methicillin sensitive. Patient has been doing well in rehab. Began experiencing nausea, vomiting, diarrhea, abdominal cramps, seemed more confused than usual, fell on his  wound and was then brought to ED for further evaluation- admitted with AMS and UTI. Head CT normal. CXR showed Pulmonary vascular congestion with mild interstitial edema. There may be a degree of congestive heart failure. No consolidation. Pt has been minimally responsive this date. Bedside swallow eval ordered.   Assessment / Plan / Recommendation Clinical Impression  Pt very lethargic for evaluation; remained alert for only a few seconds, occasionally opening eyes/ opening mouth when cued but then immediately falling back asleep, despite max cues (sternal rub, repositioning, washed face). Pt repeatedly turned away when attempted to provide ice chip. Due to decreased alertness, recommend that pt remain NPO with frequent oral care. Will reattempt PO trials next date. SLP Visit Diagnosis: Dysphagia, unspecified (R13.10)    Aspiration Risk  Severe aspiration risk    Diet Recommendation NPO   Medication Administration: Via alternative means    Other  Recommendations Oral Care Recommendations: Oral care QID   Follow up Recommendations Other (comment)(TBD)      Frequency and Duration  min 2x/week  1 week       Prognosis        Swallow Study   General HPI: Pt is a 57 y.o. 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 His wound culture grew out staph aureus methicillin sensitive. Patient has been doing well in rehab. Began experiencing nausea, vomiting, diarrhea, abdominal cramps, seemed more confused than usual, fell on his wound and was then brought to ED for further evaluation- admitted with AMS and UTI. Head CT normal. CXR showed Pulmonary vascular congestion with mild interstitial edema. There may be a degree of congestive heart failure. No consolidation. Pt has been minimally responsive this date.  Bedside swallow eval ordered. Type of Study: Bedside Swallow Evaluation Previous Swallow Assessment: none in chart Diet Prior to this Study: NPO Temperature Spikes Noted: No Respiratory Status: Room air History of Recent Intubation: No Behavior/Cognition: Lethargic/Drowsy Oral Cavity Assessment: Within Functional Limits Oral Cavity - Dentition: Adequate natural dentition Self-Feeding Abilities: Total assist Patient Positioning: Upright in bed Baseline Vocal Quality: Not observed Volitional Cough: Cognitively unable to elicit Volitional Swallow: Unable to elicit    Oral/Motor/Sensory Function Overall Oral Motor/Sensory Function: Other (comment)(unable to assess)   Ice Chips Ice chips: Impaired Presentation: Spoon Other Comments: (would not accept into oral cavity)   Thin Liquid Thin Liquid: Not tested    Nectar Thick Nectar Thick Liquid: Not tested   Honey Thick Honey Thick Liquid: Not tested   Puree Puree: Not tested   Solid   GO   Solid: Not tested        Kern Reap, MA, CCC-SLP 01/30/2017,2:06 PM [phone]

## 2017-01-30 NOTE — Progress Notes (Signed)
Pharmacy Antibiotic Note  Matthew Kline is a 57 y.o. male admitted on 01/29/2017 with bacteremia/UTI.  Pharmacy has been consulted for vancomycin dosing. Patient with history of AKA d/t chronic osteomyelitis, was recently discharged on Keflex 1/9 for MSSA wound infection.  UA concerning for UTI with large leukocytes, rare bacteria, numerous WBC, started on ceftriaxone yesterday for empiric treatment. Patient is afebrile, WBC wnl. Patient is also on HD MWF, missed last session but can wait for Monday for next session per nephrology.   Plan: Continue ceftriaxone 1 gram IV q24 hrs Vancomycin 1500 mg IV load x 1 today, followed by 750 mg IV with each HD session.  Goal pre-HD level: 15-25 Monitor dialysis schedule/tolerance, Vanc level as indicated Follow up culture results, LOT, clinical status   Weight: 155 lb 3.3 oz (70.4 kg)  Temp (24hrs), Avg:97.8 F (36.6 C), Min:97.7 F (36.5 C), Max:98 F (36.7 C)  Recent Labs  Lab 01/29/17 0900 01/29/17 1726 01/29/17 2020 01/30/17 0710  WBC 9.1  --   --  9.9  CREATININE 7.44* 8.08*  --  8.55*  LATICACIDVEN  --   --  0.57  --     Estimated Creatinine Clearance: 9.5 mL/min (A) (by C-G formula based on SCr of 8.55 mg/dL (H)).    Allergies  Allergen Reactions  . Morphine And Related Other (See Comments)    hallucinations  . Tygacil [Tigecycline] Nausea And Vomiting and Other (See Comments)    Makes him feel crazy    Antimicrobials this admission: Ceftriaxone 1/19>> Vanc 1/20>>   Microbiology results: 1/20 Bcx: not collected 1/19 Ucx: negative 1/20 MRSA PCR: neg 1/19 Ua: large leuks, rare bacteria, wbc TMTC  Thank you for allowing pharmacy to be a part of this patient's care.  Charlene Brooke, PharmD PGY1 Pharmacy Resident Phone: [phone] After 3:30PM please call [place] [phone] 01/30/2017 12:06 PM

## 2017-01-30 NOTE — Progress Notes (Signed)
PT Cancellation Note  Patient Details Name: Matthew Kline MRN: [id number] DOB: 03-19-1960   Cancelled Treatment:    Reason Eval/Treat Not Completed: Fatigue/lethargy limiting ability to participate   Patient remains unable to participate with PT evaluation. Noted per Education officer, museum that pt WILL need PT/OT evals for return to SNF (Thank you for clarifying!). Will attempt to see 1/21   [name], PT 01/30/2017, 2:22 PM

## 2017-01-30 NOTE — Progress Notes (Signed)
Patient isn't alert enough to take medications of sips of water. Patient hasn't taken any PO medications. When I tried to give his PO medications, patient will close his eyes and turn his head away. MD notified. Will continue to monitor.

## 2017-01-31 ENCOUNTER — Inpatient Hospital Stay (HOSPITAL_COMMUNITY)
Admit: 2017-01-31 | Discharge: 2017-01-31 | Disposition: A | Discharge status: Home / Self Care | Payer: Medicare HMO | Attending: Nephrology | Admitting: Nephrology

## 2017-01-31 DIAGNOSIS — Z992 Dependence on renal dialysis: Secondary | ICD-10-CM

## 2017-01-31 DIAGNOSIS — N186 End stage renal disease: Secondary | ICD-10-CM

## 2017-01-31 DIAGNOSIS — G9340 Encephalopathy, unspecified: Secondary | ICD-10-CM

## 2017-01-31 LAB — GLUCOSE, CAPILLARY
GLUCOSE-CAPILLARY: 118 mg/dL — AB (ref 65–99)
GLUCOSE-CAPILLARY: 115 mg/dL — AB (ref 65–99)
GLUCOSE-CAPILLARY: 140 mg/dL — AB (ref 65–99)
GLUCOSE-CAPILLARY: 147 mg/dL — AB (ref 65–99)
Glucose-Capillary: 110 mg/dL — ABNORMAL HIGH (ref 65–99)
Glucose-Capillary: 121 mg/dL — ABNORMAL HIGH (ref 65–99)

## 2017-01-31 LAB — RENAL FUNCTION PANEL
Albumin: 2.4 g/dL — ABNORMAL LOW (ref 3.5–5.0)
Anion gap: 20 — ABNORMAL HIGH (ref 5–15)
BUN: 65 mg/dL — AB (ref 6–20)
CALCIUM: 9.3 mg/dL (ref 8.9–10.3)
CHLORIDE: 95 mmol/L — AB (ref 101–111)
CO2: 22 mmol/L (ref 22–32)
CREATININE: 9.99 mg/dL — AB (ref 0.61–1.24)
GFR calc non Af Amer: 5 mL/min — ABNORMAL LOW (ref >60)
GFR, EST AFRICAN AMERICAN: 6 mL/min — AB (ref >60)
GLUCOSE: 129 mg/dL — AB (ref 65–99)
Phosphorus: 7.1 mg/dL — ABNORMAL HIGH (ref 2.5–4.6)
Potassium: 5.8 mmol/L — ABNORMAL HIGH (ref 3.5–5.1)
SODIUM: 137 mmol/L (ref 135–145)

## 2017-01-31 LAB — CBC
HCT: 29.3 % — ABNORMAL LOW (ref 39.0–52.0)
Hemoglobin: 8.9 g/dL — ABNORMAL LOW (ref 13.0–17.0)
MCH: 27.5 pg (ref 26.0–34.0)
MCHC: 30.4 g/dL (ref 30.0–36.0)
MCV: 90.4 fL (ref 78.0–100.0)
PLATELETS: 213 10*3/uL (ref 150–400)
RBC: 3.24 MIL/uL — ABNORMAL LOW (ref 4.22–5.81)
RDW: 18 % — AB (ref 11.5–15.5)
WBC: 13.5 10*3/uL — ABNORMAL HIGH (ref 4.0–10.5)

## 2017-01-31 MED ORDER — LIDOCAINE-PRILOCAINE 2.5-2.5 % EX CREA: 1.0000 "application " | TOPICAL_CREAM | CUTANEOUS | Status: DC | PRN

## 2017-01-31 MED ORDER — SODIUM CHLORIDE 0.9 % IV SOLN: 100.0000 mL | INTRAVENOUS | Status: DC | PRN

## 2017-01-31 MED ORDER — VANCOMYCIN HCL IN DEXTROSE 750-5 MG/150ML-% IV SOLN
INTRAVENOUS | Status: AC
  Filled 2017-01-31: qty 150

## 2017-01-31 MED ORDER — PENTAFLUOROPROP-TETRAFLUOROETH EX AERO: 1.0000 "application " | INHALATION_SPRAY | CUTANEOUS | Status: DC | PRN

## 2017-01-31 MED ORDER — LIDOCAINE HCL (PF) 1 % IJ SOLN: 5.0000 mL | INTRAMUSCULAR | Status: DC | PRN

## 2017-01-31 MED ORDER — ORAL CARE MOUTH RINSE
15.0000 mL | Freq: Two times a day (BID) | OROMUCOSAL | Status: DC
  Administered 2017-01-31: 15 mL via OROMUCOSAL

## 2017-01-31 MED ORDER — HEPARIN SODIUM (PORCINE) 1000 UNIT/ML DIALYSIS: 20.0000 [IU]/kg | INTRAMUSCULAR | Status: DC | PRN

## 2017-01-31 MED ORDER — HEPARIN SODIUM (PORCINE) 1000 UNIT/ML DIALYSIS: 1000.0000 [IU] | INTRAMUSCULAR | Status: DC | PRN

## 2017-01-31 NOTE — Progress Notes (Signed)
PROGRESS NOTE    Matthew Kline  MPN:[id number] DOB: 1960/08/24 DOA: 01/29/2017 PCP: Sandi Mariscal, MD   Brief Narrative: 57 year old male with chronic osteomyelitis status post left AKA in the beginning of January, ESRD on hemodialysis, chronic anemia, presented from rehab center for a fall and change in mental status.  Patient was discharged from hospital on January 20, 2007 on Keflex by orthopedics.  The wound culture from the surgery going staph which is methicillin sensitive.  He was found to have UTI, started on ceftriaxone and admitted for further evaluation.  Assessment & Plan:   #Altered mental status: Exact etiology unknown.  Likely acute metabolic encephalopathy in the setting of UTI versus related with any pain medication. -The repeat CT scan with no acute finding.  No response with a dose of Narcan. -He was opening his eyes when called by his name but not talking. -IV antibiotics.  Follow-up culture results. -Ammonia level not elevated, lactate not elevated, chest x-ray with no pneumonia. -Continue IV dextrose, Pepcid and supportive care. hold sedatives. -Speech and swallow evaluation. -I will check EEG.  #ESRD on hemodialysis, pulmonary edema and vascular congestion likely chronic: Nephrology consulted.  dialysis today.  Mild hyperkalemia expect to improve after hemodialysis.  #Chronic jerking movement of extremities: The patient's wife reported that it runs in the family and patient has these jerking movement for a long time.  Unchanged.  # Hypertension: Unable to take p.o. medicine.  Hydralazine as needed for the management of hypertension.  #Anemia of chronic kidney disease: Monitor CBC.  Hemoglobin acceptable.  #History of coronary artery disease: Continue aspirin, Coreg, Lipitor, Plavix when able to take orally.  DVT prophylaxis: Heparin subcutaneous Code Status: Full code Family Communication: Discussed patient's wife at bedside yesterday Disposition Plan:  Currently admitted    Consultants:   Nephrology  Procedures: None Antimicrobials: Vancomycin and ceftriaxone  Subjective: Seen and examined at dialysis unit.  Patient opened eyes with his name but not answering the question.  Not following commands.  Review of systems limited. Objective: Vitals:   01/31/17 1030 01/31/17 1100 01/31/17 1130 01/31/17 1148  BP: (!) 154/89 (!) 160/95 (!) 145/86 (!) 158/86  Pulse: 93 94 93 99  Resp: 18 18 17 18   Temp:    98.4 F (36.9 C)  TempSrc:    Oral  SpO2:    94%  Weight:    67.9 kg (149 lb 11.1 oz)    Intake/Output Summary (Last 24 hours) at 01/31/2017 1315 Last data filed at 01/31/2017 1148 Gross per 24 hour  Intake 75 ml  Output 3100 ml  Net -3025 ml   Filed Weights   01/30/17 2235 01/31/17 0728 01/31/17 1148  Weight: 71.3 kg (157 lb 3 oz) 70.9 kg (156 lb 4.9 oz) 67.9 kg (149 lb 11.1 oz)    Examination:  General exam: Lying on bed, not in distress, opens eyes with the name.  Ill elderly looking male. Respiratory system: Clear bilateral, respiratory effort normal Cardiovascular system: Regular rate rhythm S1-S2 normal.  No pedal edema. Gastrointestinal system: Abdomen soft, nontender.  Normal bowel sound. Central nervous system: Opens eyes with name only. Skin: No rashes, lesions or ulcers Psychiatry: Judgement and insight unable to assess.    Data Reviewed: I have personally reviewed following labs and imaging studies  CBC: Recent Labs  Lab 01/29/17 0900 01/30/17 0710 01/31/17 0755  WBC 9.1 9.9 13.5*  NEUTROABS 7.1  --   --   HGB 8.9* 9.9* 8.9*  HCT 29.7* 32.2* 29.3*  MCV 90.8 90.7 90.4  PLT 248 230 762   Basic Metabolic Panel: Recent Labs  Lab 01/29/17 0900 01/29/17 1726 01/30/17 0710 01/31/17 0755  NA 133* 135 138 137  K 5.3* 4.9 5.4* 5.8*  CL 94* 94* 95* 95*  CO2 22 26 26 22   GLUCOSE 113* 101* 78 129*  BUN 45* 45* 52* 65*  CREATININE 7.44* 8.08* 8.55* 9.99*  CALCIUM 9.4 9.4 9.6 9.3  PHOS  --   --    --  7.1*   GFR: Estimated Creatinine Clearance: 7.8 mL/min (A) (by C-G formula based on SCr of 9.99 mg/dL (H)). Liver Function Tests: Recent Labs  Lab 01/29/17 0900 01/31/17 0755  AST 28  --   ALT 8*  --   ALKPHOS 97  --   BILITOT 1.3*  --   PROT 7.6  --   ALBUMIN 2.7* 2.4*   No results for input(s): LIPASE, AMYLASE in the last 168 hours. Recent Labs  Lab 01/29/17 1726  AMMONIA 19   Coagulation Profile: No results for input(s): INR, PROTIME in the last 168 hours. Cardiac Enzymes: No results for input(s): CKTOTAL, CKMB, CKMBINDEX, TROPONINI in the last 168 hours. BNP (last 3 results) No results for input(s): PROBNP in the last 8760 hours. HbA1C: No results for input(s): HGBA1C in the last 72 hours. CBG: Recent Labs  Lab 01/30/17 1617 01/30/17 2016 01/31/17 0004 01/31/17 0409 01/31/17 0726  GLUCAP 97 92 110* 118* 121*   Lipid Profile: No results for input(s): CHOL, HDL, LDLCALC, TRIG, CHOLHDL, LDLDIRECT in the last 72 hours. Thyroid Function Tests: No results for input(s): TSH, T4TOTAL, FREET4, T3FREE, THYROIDAB in the last 72 hours. Anemia Panel: No results for input(s): VITAMINB12, FOLATE, FERRITIN, TIBC, IRON, RETICCTPCT in the last 72 hours. Sepsis Labs: Recent Labs  Lab 01/29/17 2020  LATICACIDVEN 0.57    Recent Results (from the past 240 hour(s))  Urine culture     Status: None   Collection Time: 01/29/17 11:41 AM  Result Value Ref Range Status   Specimen Description URINE, CLEAN CATCH  Final   Special Requests NONE  Final   Culture NO GROWTH  Final   Report Status 01/30/2017 FINAL  Final  MRSA PCR Screening     Status: None   Collection Time: 01/30/17 12:06 AM  Result Value Ref Range Status   MRSA by PCR NEGATIVE NEGATIVE Final    Comment:        The GeneXpert MRSA Assay (FDA approved for NASAL specimens only), is one component of a comprehensive MRSA colonization surveillance program. It is not intended to diagnose MRSA infection nor to  guide or monitor treatment for MRSA infections.          Radiology Studies: Dg Chest 1 View  Result Date: 01/29/2017 CLINICAL DATA:  Pain following fall EXAM: CHEST 1 VIEW COMPARISON:  November 08, 2016 FINDINGS: There is mild interstitial edema. No airspace consolidation. There is cardiomegaly with mild pulmonary venous hypertension. Patient is status post median sternotomy. No adenopathy. There is postoperative change in the lower cervical spine. IMPRESSION: Pulmonary vascular congestion with mild interstitial edema. There may be a degree of congestive heart failure. No consolidation. Electronically Signed   By: Lowella Grip III M.D.   On: 01/29/2017 15:10   Ct Head Wo Contrast  Result Date: 01/30/2017 CLINICAL DATA:  Change in responsiveness.  Lethargy. EXAM: CT HEAD WITHOUT CONTRAST TECHNIQUE: Contiguous axial images were obtained from the base of the skull through the vertex without intravenous contrast.  COMPARISON:  January 29, 2017 FINDINGS: Brain: No subdural, epidural, or subarachnoid hemorrhage. The cerebellum, brainstem, and basal cisterns are normal. Ventricles and sulci are unchanged. Scattered mild white matter changes again identified. No evidence of acute cortical ischemia. 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 No other infarcts. Vascular: Calcified atherosclerotic changes in the carotids within the siphons. Skull: Normal. Negative for fracture or focal lesion. Sinuses/Orbits: No acute finding. Other: None. IMPRESSION: No acute intracranial abnormalities. Electronically Signed   By: Dorise Bullion III M.D   On: 01/30/2017 21:54        Scheduled Meds: . amLODipine  5 mg Oral QHS  . aspirin EC  162 mg Oral Daily  . atorvastatin  40 mg Oral q1800  . carvedilol  12.5 mg Oral BID WC  . clopidogrel  75 mg Oral q1800  . feeding supplement (NEPRO CARB STEADY)  237 mL Oral BID BM  . feeding supplement (PRO-STAT SUGAR FREE 64)  30 mL Oral  BID  . ferric citrate  420 mg Oral TID WC  . heparin injection (subcutaneous)  5,000 Units Subcutaneous Q8H  . sodium chloride flush  3 mL Intravenous Q12H   Continuous Infusions: . sodium chloride    . cefTRIAXone (ROCEPHIN)  IV Stopped (01/30/17 1445)  . dextrose 1 mL (01/30/17 1414)  . famotidine (PEPCID) IV Stopped (01/30/17 1649)  . Vancomycin    . vancomycin 750 mg (01/31/17 1048)     LOS: 1 day    Levenia Skalicky Tanna Furry, MD [facility] Pager [phone]  If 7PM-7AM, please contact night-coverage [email or web address] Password [email or web address] 01/31/2017, 1:15 PM

## 2017-01-31 NOTE — Procedures (Signed)
ELECTROENCEPHALOGRAM REPORT  Date of Study: 01/31/2017  Patient's Name: Matthew Kline MRN: [id number] Date of Birth: 03-Jan-1961  Referring Provider: Dr. Lawson Radar  Clinical History: This is a 57 year old man with altered mental status.  Medications: Norvasc Lipitor Coreg Ceftriaxone Aspirin Plavix Neurontin  Technical Summary: A multichannel digital EEG recording measured by the international 10-20 system with electrodes applied with paste and impedances below 5000 ohms performed as portable with EKG monitoring in a poorly responsive patient.  Hyperventilation and photic stimulation were not performed.  The digital EEG was referentially recorded, reformatted, and digitally filtered in a variety of bipolar and referential montages for optimal display.   Description: The patient is poorly responsive during the recording. There is no clear posterior dominant rhythm seen. The background is disorganized with a large amount of diffuse 4-5 Hz theta and 2-3 Hz delta slowing seen. There are generalized discharges with triphasic appearance (anteroposterior gradient) seen occurring every 0.5 to 1 second with no evolution in frequency or amplitude seen. Some of the sharp waves phase reverse over the left occipital region (O1). Normal sleep architecture is not seen. Hyperventilation and photic stimulation were not performed.  There were no electrographic seizures seen.    EKG lead showed sinus tachycardia.  Impression: This EEG is abnormal due to the presence of: 1. Moderate diffuse background slowing 2. Generalized triphasic waves throughout the recording 3. Occasional sharp waves over the left occipital region  Clinical Correlation of the above findings indicates diffuse cerebral dysfunction that is non-specific in etiology and can be seen with hypoxic/ischemic injury, toxic/metabolic encephalopathies, or medication effect. Triphasic waves are typically seen with hepatic  encephalopathy, but can be seen with other metabolic encephalopathies as well. There is a possible tendency for seizures to arise from the left occipital region. Clinical correlation is advised.   Ellouise Newer, M.D.

## 2017-01-31 NOTE — Progress Notes (Signed)
[facility] Progress Note  Subjective: on HD, not responding well  Vitals:   01/31/17 0900 01/31/17 0930 01/31/17 1000 01/31/17 1030  BP: (!) 167/95 (!) 160/92 (!) 158/87 (!) 154/89  Pulse: 98 96 97 93  Resp: 18 18 18 18   Temp:      TempSrc:      SpO2:      Weight:        Inpatient medications: . amLODipine  5 mg Oral QHS  . aspirin EC  162 mg Oral Daily  . atorvastatin  40 mg Oral q1800  . carvedilol  12.5 mg Oral BID WC  . clopidogrel  75 mg Oral q1800  . feeding supplement (NEPRO CARB STEADY)  237 mL Oral BID BM  . feeding supplement (PRO-STAT SUGAR FREE 64)  30 mL Oral BID  . ferric citrate  420 mg Oral TID WC  . heparin injection (subcutaneous)  5,000 Units Subcutaneous Q8H  . sodium chloride flush  3 mL Intravenous Q12H   . sodium chloride    . sodium chloride    . sodium chloride    . cefTRIAXone (ROCEPHIN)  IV Stopped (01/30/17 1445)  . dextrose 1 mL (01/30/17 1414)  . famotidine (PEPCID) IV Stopped (01/30/17 1649)  . Vancomycin    . vancomycin 750 mg (01/31/17 1048)   sodium chloride, sodium chloride, sodium chloride, heparin, heparin, hydrALAZINE, lidocaine (PF), lidocaine-prilocaine, ondansetron **OR** ondansetron (ZOFRAN) IV, pentafluoroprop-tetrafluoroeth, sodium chloride flush  Exam: Opens eyes briefly, then falls back asleep No jvd Chest cta bilat RRR 2/6 sem Abd soft ntnd Ext L AKA wrapped, R leg no edema Lethargic, not interacting L AVF+bruit   Dialysis: MWF [facility] 4h   400/800   70kg   2/2 bath  AVF   Hep 4000 - Mircera 244mcg IV q 2 weeks (last given during [facility] admit) - Venofer 100mg  x 5 ordered (3 given) - Hectoral 69mcg IV q HD - BMM: Auryxia 1/meals.      Impression: 1 AMS  2 ESRD HD is MWF, hyperkalemia 3 UTI  4 HTN^ - vol^ 5 DM 6 anemia use ESA 7 AKA   Plan - HD, abx, lower vol /K   Kelly Splinter MD [facility] pager [phone]   01/31/2017, 12:21 PM   Recent Labs  Lab 01/29/17 1726  01/30/17 0710 01/31/17 0755  NA 135 138 137  K 4.9 5.4* 5.8*  CL 94* 95* 95*  CO2 26 26 22   GLUCOSE 101* 78 129*  BUN 45* 52* 65*  CREATININE 8.08* 8.55* 9.99*  CALCIUM 9.4 9.6 9.3  PHOS  --   --  7.1*   Recent Labs  Lab 01/29/17 0900 01/31/17 0755  AST 28  --   ALT 8*  --   ALKPHOS 97  --   BILITOT 1.3*  --   PROT 7.6  --   ALBUMIN 2.7* 2.4*   Recent Labs  Lab 01/29/17 0900 01/30/17 0710 01/31/17 0755  WBC 9.1 9.9 13.5*  NEUTROABS 7.1  --   --   HGB 8.9* 9.9* 8.9*  HCT 29.7* 32.2* 29.3*  MCV 90.8 90.7 90.4  PLT 248 230 213   Iron/TIBC/Ferritin/ %Sat No results found for: IRON, TIBC, FERRITIN, IRONPCTSAT

## 2017-01-31 NOTE — Progress Notes (Signed)
Patient started to holler "let me go." multiple times. Explained to patient that he is in the hospital. Patient will look at [place] but will not talk. Will continue to monitor. Floor pads in place.
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
## 2017-01-31 NOTE — Progress Notes (Signed)
EEG completed; results pending.    

## 2017-01-31 NOTE — Progress Notes (Signed)
   01/31/17 0900  PT Visit Information  Last PT Received On 01/31/17  Reason Eval/Treat Not Completed Patient at procedure or test/unavailable    Matthew Kline, PT MS Acute Rehab Dept. Number: [place] and [place]

## 2017-01-31 NOTE — Progress Notes (Signed)
SLP Cancellation Note  Patient Details Name: Matthew Kline MRN: [id number] DOB: 06-Oct-1960   Cancelled treatment:       Reason Eval/Treat Not Completed: Medical issues which prohibited therapy   Elvina Sidle, M.S., CCC-SLP 01/31/2017, 5:07 PM

## 2017-02-01 LAB — GLUCOSE, CAPILLARY
GLUCOSE-CAPILLARY: 143 mg/dL — AB (ref 65–99)
Glucose-Capillary: 117 mg/dL — ABNORMAL HIGH (ref 65–99)
Glucose-Capillary: 133 mg/dL — ABNORMAL HIGH (ref 65–99)
Glucose-Capillary: 143 mg/dL — ABNORMAL HIGH (ref 65–99)
Glucose-Capillary: 152 mg/dL — ABNORMAL HIGH (ref 65–99)

## 2017-02-01 LAB — VITAMIN B12: Vitamin B-12: 833 pg/mL (ref 180–914)

## 2017-02-01 LAB — CBC
HEMATOCRIT: 32 % — AB (ref 39.0–52.0)
Hemoglobin: 9.9 g/dL — ABNORMAL LOW (ref 13.0–17.0)
MCH: 28 pg (ref 26.0–34.0)
MCHC: 30.9 g/dL (ref 30.0–36.0)
MCV: 90.4 fL (ref 78.0–100.0)
PLATELETS: 207 10*3/uL (ref 150–400)
RBC: 3.54 MIL/uL — ABNORMAL LOW (ref 4.22–5.81)
RDW: 18.2 % — AB (ref 11.5–15.5)
WBC: 12.5 10*3/uL — AB (ref 4.0–10.5)

## 2017-02-01 LAB — BASIC METABOLIC PANEL
Anion gap: 19 — ABNORMAL HIGH (ref 5–15)
BUN: 31 mg/dL — AB (ref 6–20)
CALCIUM: 9.7 mg/dL (ref 8.9–10.3)
CO2: 23 mmol/L (ref 22–32)
Chloride: 98 mmol/L — ABNORMAL LOW (ref 101–111)
Creatinine, Ser: 5.72 mg/dL — ABNORMAL HIGH (ref 0.61–1.24)
GFR calc Af Amer: 11 mL/min — ABNORMAL LOW (ref >60)
GFR calc non Af Amer: 10 mL/min — ABNORMAL LOW (ref >60)
GLUCOSE: 155 mg/dL — AB (ref 65–99)
POTASSIUM: 4.7 mmol/L (ref 3.5–5.1)
Sodium: 140 mmol/L (ref 135–145)

## 2017-02-01 LAB — TSH: TSH: 1.21 u[IU]/mL (ref 0.350–4.500)

## 2017-02-01 NOTE — Progress Notes (Signed)
Dried blood noted during assesment on entire posterior of dressing on LLE with dried blood visible on towel placed beneath dressing. RN notified. Dressing was changed with supervision of staff RN Angela Nevin and a small amount of fresh blood was noted draining from the wound.   Matthew Kline SN [facility]

## 2017-02-01 NOTE — Plan of Care (Signed)
  Progressing SLP Dysphagia Goals Patient will utilize recommended strategies Description Patient will tolerate current diet (reg/thin) without overt s/s aspiration or decline in respiratory status, and utilize recommended strategies during swallow to increase swallowing safety with min assist.  02/01/2017 1228 - Progressing by Colon Flattery B, CCC-SLP

## 2017-02-01 NOTE — Evaluation (Signed)
Physical Therapy Evaluation Patient Details Name: Matthew Kline MRN: [id number] DOB: April 20, 1960 Today's Date: 02/01/2017   History of Present Illness  Pt is a 57 year old male with chronic osteomyelitis status post left AKA in the beginning of January, ESRD on hemodialysis, and chronic anemia. He presented from rehab center for a fall and change in mental status.  Patient was discharged from hospital on January 20, 2007 on Keflex by orthopedics.      Clinical Impression  Pt admitted with above diagnosis. Pt currently with functional limitations due to the deficits listed below (see PT Problem List). On eval, pt pleasantly confused. Oriented to self only. He was lethargic but easily arousable. Mobility limited to in bed only due to excessive bleeding noted L residual limb. RN notified. He required min assist supine to/from sit and demonstrated good sitting balance.  Pt will benefit from skilled PT to increase their independence and safety with mobility to allow discharge to the venue listed below.       Follow Up Recommendations SNF    Equipment Recommendations  Other (comment)(defer to next venue)    Recommendations for Other Services       Precautions / Restrictions Precautions Precautions: Fall Restrictions LLE Weight Bearing: Non weight bearing      Mobility  Bed Mobility Overal bed mobility: Needs Assistance Bed Mobility: Supine to Sit;Sit to Supine     Supine to sit: Min assist;HOB elevated Sit to supine: Min assist;HOB elevated   General bed mobility comments: verbal cues for sequencing  Transfers                    Ambulation/Gait                Stairs            Wheelchair Mobility    Modified Rankin (Stroke Patients Only)       Balance Overall balance assessment: History of Falls;Needs assistance Sitting-balance support: Feet supported;No upper extremity supported Sitting balance-Leahy Scale: Good                                        Pertinent Vitals/Pain Pain Assessment: Faces Faces Pain Scale: No hurt    Home Living Family/patient expects to be discharged to:: Skilled nursing facility Living Arrangements: Children Available Help at Discharge: Family;Available PRN/intermittently Type of Home: House Home Access: Stairs to enter Entrance Stairs-Rails: Right Entrance Stairs-Number of Steps: 1 Home Layout: One level Home Equipment: Kasandra Knudsen - single point Additional Comments: Patient lives with 25 year old daughter in 2 bedroom apartment.     Prior Function Level of Independence: Independent with assistive device(s)         Comments: independent with mobility prior to Jan. 2019.     Hand Dominance   Dominant Hand: Right    Extremity/Trunk Assessment   Upper Extremity Assessment Upper Extremity Assessment: Defer to OT evaluation    Lower Extremity Assessment Lower Extremity Assessment: Generalized weakness LLE Deficits / Details: AKA       Communication   Communication: No difficulties  Cognition Arousal/Alertness: Lethargic(but easily arousable) Behavior During Therapy: WFL for tasks assessed/performed Overall Cognitive Status: Impaired/Different from baseline Area of Impairment: Orientation;Attention;Memory;Following commands;Safety/judgement;Awareness;Problem solving                 Orientation Level: Disoriented to;Time;Place;Situation Current Attention Level: Sustained Memory: Decreased short-term memory Following Commands: Follows one step commands  with increased time;Follows one step commands consistently Safety/Judgement: Decreased awareness of safety Awareness: Intellectual Problem Solving: Slow processing;Decreased initiation;Difficulty sequencing;Requires verbal cues General Comments: Pt pleasantly confused. Awake and interactive but immediately closes eyes and dozes off when not being spoken to.       General Comments      Exercises      Assessment/Plan    PT Assessment Patient needs continued PT services  PT Problem List Decreased strength;Decreased range of motion;Decreased activity tolerance;Decreased balance;Decreased mobility;Decreased cognition;Pain       PT Treatment Interventions DME instruction;Gait training;Stair training;Functional mobility training;Therapeutic activities;Therapeutic exercise;Balance training;Patient/family education;Wheelchair mobility training    PT Goals (Current goals can be found in the Care Plan section)  Acute Rehab PT Goals Patient Stated Goal: not stated, Pt confused. PT Goal Formulation: Patient unable to participate in goal setting Time For Goal Achievement: 02/15/17 Potential to Achieve Goals: Good    Frequency Min 3X/week   Barriers to discharge        Co-evaluation               AM-PAC PT "6 Clicks" Daily Activity  Outcome Measure Difficulty turning over in bed (including adjusting bedclothes, sheets and blankets)?: A Little Difficulty moving from lying on back to sitting on the side of the bed? : A Lot Difficulty sitting down on and standing up from a chair with arms (e.g., wheelchair, bedside commode, etc,.)?: Unable Help needed moving to and from a bed to chair (including a wheelchair)?: A Lot Help needed walking in hospital room?: Total Help needed climbing 3-5 steps with a railing? : Total 6 Click Score: 10    End of Session   Activity Tolerance: Treatment limited secondary to medical complications (Comment)(excessive bleeing L AKA) Patient left: in bed;with call bell/phone within reach;with bed alarm set Nurse Communication: Mobility status;Other (comment)(bleeding L AKA) PT Visit Diagnosis: Other abnormalities of gait and mobility (R26.89)    Time: 1001-1015 PT Time Calculation (min) (ACUTE ONLY): 14 min   Charges:   PT Evaluation $PT Eval Moderate Complexity: 1 Mod     PT G Codes:        Lorrin Goodell, PT  Office # [phone] Pager  [phone]   Lorriane Shire 02/01/2017, 10:38 AM

## 2017-02-01 NOTE — Progress Notes (Signed)
  Speech Language Pathology Treatment: Dysphagia  Patient Details Name: Matthew Kline MRN: [id number] DOB: 1960/07/29 Today's Date: 02/01/2017 Time: 3329-5188 SLP Time Calculation (min) (ACUTE ONLY): 25 min  Assessment / Plan / Recommendation Clinical Impression  Pt seen at bedside for assessment of po readiness. Pt awake and alert today, but significantly confused. Wife present, and assisted SLP with evaluation, as pt confusion interfered with participation. No overt s/s aspiration observed with thin liquid, puree, or solid consistency. Will begin regular diet and thin liquid. ST will follow briefly for assessment of diet tolerance and education.     HPI HPI: Pt is a 57 y.o. 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 His wound culture grew out staph aureus methicillin sensitive. Patient has been doing well in rehab. Began experiencing nausea, vomiting, diarrhea, abdominal cramps, seemed more confused than usual, fell on his wound and was then brought to ED for further evaluation- admitted with AMS and UTI. Head CT normal. CXR showed Pulmonary vascular congestion with mild interstitial edema. There may be a degree of congestive heart failure. No consolidation. Pt has been minimally responsive this date. Bedside swallow eval ordered.      SLP Plan  Continue with current plan of care       Recommendations  Diet recommendations: Regular;Thin liquid Liquids provided via: Cup;Straw Medication Administration: Whole meds with liquid Supervision: Patient able to self feed;Staff to assist with self feeding;Full supervision/cueing for compensatory strategies(due to confusion) Compensations: Minimize environmental distractions;Slow rate;Small sips/bites Postural Changes and/or Swallow Maneuvers: Seated  upright 90 degrees;Upright 30-60 min after meal                Oral Care Recommendations: Oral care QID Follow up Recommendations: (none anticipated) SLP Visit Diagnosis: Dysphagia, unspecified (R13.10) Plan: Continue with current plan of care       Kelley Knoth B. Quentin Ore [facility], CCC-SLP Speech Language Pathologist [phone]  Shonna Chock 02/01/2017, 12:23 PM

## 2017-02-01 NOTE — Progress Notes (Signed)
PROGRESS NOTE    Matthew Kline  FFM:[id number] DOB: 1960/10/14 DOA: 01/29/2017 PCP: Sandi Mariscal, MD   Brief Narrative: 56 year old male with chronic osteomyelitis status post left AKA in the beginning of January, ESRD on hemodialysis, chronic anemia, presented from rehab center for a fall and change in mental status.  Patient was discharged from hospital on January 20, 2007 on Keflex by orthopedics.  The wound culture from the surgery going staph which is methicillin sensitive.  He was found to have UTI, started on ceftriaxone and admitted for further evaluation.  Assessment & Plan:   #Altered mental status:Likely acute metabolic encephalopathy of unknown etiology concerning for related with any pain medication/enteritis versus infection.  Urine culture negative.  Blood culture still pending.   -Patient is more alert awake and oriented today.  Diet ordered.  CT scan with no acute finding.  Discontinue IV vancomycin, MRSA negative.  For now I will continue IV ceftriaxone until I have final culture results.  Continue to monitor. -Ammonia level not elevated, lactate not elevated, chest x-ray with no pneumonia. -EEG with no seizure. -Discontinue IV dextrose since patient cannot take orally.  #ESRD on hemodialysis, pulmonary edema and vascular congestion likely chronic: Nephrology consulted.  Monitor BMP.  #Chronic jerking movement of extremities: The patient's wife reported that it runs in the family and patient has these jerking movement for a long time.  Unchanged.  # Hypertension: Continue p.o. medication.  Monitor blood pressure.  #Anemia of chronic kidney disease: Monitor CBC.  Hemoglobin acceptable.  #History of coronary artery disease: Continue aspirin, Coreg, Lipitor, Plavix when able to take orally.  DVT prophylaxis: Heparin subcutaneous Code Status: Full code Family Communication: Discussed patient's wife Disposition Plan: Currently admitted, social worker consulted for SNF  placement.    Consultants:   Nephrology  Procedures: None Antimicrobials: Discontinue vancomycin on 1/22 and continue ceftriaxone  Subjective: Seen and examined at bedside.  Patient was alert awake and oriented to hospital, January and his name.  Denied nausea vomiting headache dizziness. Objective: Vitals:   01/31/17 2019 02/01/17 0217 02/01/17 0357 02/01/17 0750  BP: (!) 177/93 (!) 146/82 137/78 (!) 142/80  Pulse: 98 96 95 92  Resp: 17  17 18   Temp: 98.3 F (36.8 C)  98.2 F (36.8 C) 98.2 F (36.8 C)  TempSrc: Oral  Oral Oral  SpO2: 98% 99% 100% 97%  Weight: 67.8 kg (149 lb 7.6 oz)       Intake/Output Summary (Last 24 hours) at 02/01/2017 1301 Last data filed at 02/01/2017 0600 Gross per 24 hour  Intake 945 ml  Output 0 ml  Net 945 ml   Filed Weights   01/31/17 0728 01/31/17 1148 01/31/17 2019  Weight: 70.9 kg (156 lb 4.9 oz) 67.9 kg (149 lb 11.1 oz) 67.8 kg (149 lb 7.6 oz)    Examination:  General exam: Alert awake and oriented, lying in bed comfortable, not in distress respiratory system: Clear bilateral, respiratory effort normal Cardiovascular system: Regular rate rhythm, S1-S2 normal.  No pedal edema. Gastrointestinal system: Abdomen soft, nontender.  Bowel sounds positive.. Central nervous system: Alert and oriented nonfocal. Skin: No rashes, lesions or ulcers    Data Reviewed: I have personally reviewed following labs and imaging studies  CBC: Recent Labs  Lab 01/29/17 0900 01/30/17 0710 01/31/17 0755 02/01/17 0019  WBC 9.1 9.9 13.5* 12.5*  NEUTROABS 7.1  --   --   --   HGB 8.9* 9.9* 8.9* 9.9*  HCT 29.7* 32.2* 29.3* 32.0*  MCV 90.8  90.7 90.4 90.4  PLT 248 230 213 343   Basic Metabolic Panel: Recent Labs  Lab 01/29/17 0900 01/29/17 1726 01/30/17 0710 01/31/17 0755 02/01/17 0019  NA 133* 135 138 137 140  K 5.3* 4.9 5.4* 5.8* 4.7  CL 94* 94* 95* 95* 98*  CO2 22 26 26 22 23   GLUCOSE 113* 101* 78 129* 155*  BUN 45* 45* 52* 65* 31*    CREATININE 7.44* 8.08* 8.55* 9.99* 5.72*  CALCIUM 9.4 9.4 9.6 9.3 9.7  PHOS  --   --   --  7.1*  --    GFR: Estimated Creatinine Clearance: 13.7 mL/min (A) (by C-G formula based on SCr of 5.72 mg/dL (H)). Liver Function Tests: Recent Labs  Lab 01/29/17 0900 01/31/17 0755  AST 28  --   ALT 8*  --   ALKPHOS 97  --   BILITOT 1.3*  --   PROT 7.6  --   ALBUMIN 2.7* 2.4*   No results for input(s): LIPASE, AMYLASE in the last 168 hours. Recent Labs  Lab 01/29/17 1726  AMMONIA 19   Coagulation Profile: No results for input(s): INR, PROTIME in the last 168 hours. Cardiac Enzymes: No results for input(s): CKTOTAL, CKMB, CKMBINDEX, TROPONINI in the last 168 hours. BNP (last 3 results) No results for input(s): PROBNP in the last 8760 hours. HbA1C: No results for input(s): HGBA1C in the last 72 hours. CBG: Recent Labs  Lab 01/31/17 2017 02/01/17 0037 02/01/17 0354 02/01/17 0747 02/01/17 1132  GLUCAP 147* 152* 143* 143* 133*   Lipid Profile: No results for input(s): CHOL, HDL, LDLCALC, TRIG, CHOLHDL, LDLDIRECT in the last 72 hours. Thyroid Function Tests: Recent Labs    02/01/17 1030  TSH 1.210   Anemia Panel: No results for input(s): VITAMINB12, FOLATE, FERRITIN, TIBC, IRON, RETICCTPCT in the last 72 hours. Sepsis Labs: Recent Labs  Lab 01/29/17 2020  LATICACIDVEN 0.57    Recent Results (from the past 240 hour(s))  Urine culture     Status: None   Collection Time: 01/29/17 11:41 AM  Result Value Ref Range Status   Specimen Description URINE, CLEAN CATCH  Final   Special Requests NONE  Final   Culture NO GROWTH  Final   Report Status 01/30/2017 FINAL  Final  MRSA PCR Screening     Status: None   Collection Time: 01/30/17 12:06 AM  Result Value Ref Range Status   MRSA by PCR NEGATIVE NEGATIVE Final    Comment:        The GeneXpert MRSA Assay (FDA approved for NASAL specimens only), is one component of a comprehensive MRSA colonization surveillance  program. It is not intended to diagnose MRSA infection nor to guide or monitor treatment for MRSA infections.   Culture, blood (routine x 2)     Status: None (Preliminary result)   Collection Time: 01/30/17 12:02 PM  Result Value Ref Range Status   Specimen Description BLOOD RIGHT ANTECUBITAL  Final   Special Requests   Final    BOTTLES DRAWN AEROBIC ONLY Blood Culture adequate volume   Culture NO GROWTH 2 DAYS  Final   Report Status PENDING  Incomplete  Culture, blood (routine x 2)     Status: None (Preliminary result)   Collection Time: 01/30/17 12:09 PM  Result Value Ref Range Status   Specimen Description BLOOD LEFT HAND  Final   Special Requests IN PEDIATRIC BOTTLE Blood Culture adequate volume  Final   Culture NO GROWTH 2 DAYS  Final  Report Status PENDING  Incomplete         Radiology Studies: Ct Head Wo Contrast  Result Date: 01/30/2017 CLINICAL DATA:  Change in responsiveness.  Lethargy. EXAM: CT HEAD WITHOUT CONTRAST TECHNIQUE: Contiguous axial images were obtained from the base of the skull through the vertex without intravenous contrast. COMPARISON:  January 29, 2017 FINDINGS: Brain: No subdural, epidural, or subarachnoid hemorrhage. The cerebellum, brainstem, and basal cisterns are normal. Ventricles and sulci are unchanged. Scattered mild white matter changes again identified. No evidence of acute cortical ischemia. 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 No other infarcts. Vascular: Calcified atherosclerotic changes in the carotids within the siphons. Skull: Normal. Negative for fracture or focal lesion. Sinuses/Orbits: No acute finding. Other: None. IMPRESSION: No acute intracranial abnormalities. Electronically Signed   By: Dorise Bullion III M.D   On: 01/30/2017 21:54        Scheduled Meds: . amLODipine  5 mg Oral QHS  . aspirin EC  162 mg Oral Daily  . atorvastatin  40 mg Oral q1800  . carvedilol  12.5 mg Oral BID WC  .  clopidogrel  75 mg Oral q1800  . feeding supplement (NEPRO CARB STEADY)  237 mL Oral BID BM  . feeding supplement (PRO-STAT SUGAR FREE 64)  30 mL Oral BID  . ferric citrate  420 mg Oral TID WC  . heparin injection (subcutaneous)  5,000 Units Subcutaneous Q8H  . mouth rinse  15 mL Mouth Rinse BID  . sodium chloride flush  3 mL Intravenous Q12H   Continuous Infusions: . sodium chloride    . cefTRIAXone (ROCEPHIN)  IV Stopped (01/31/17 1447)  . dextrose 1 mL (01/30/17 1414)  . famotidine (PEPCID) IV Stopped (01/31/17 1659)     LOS: 2 days    Sumedh Shinsato Tanna Furry, MD [facility] Pager [phone]  If 7PM-7AM, please contact night-coverage [email or web address] Password [email or web address] 02/01/2017, 1:01 PM

## 2017-02-01 NOTE — Progress Notes (Signed)
Patient at the nurses desk at present due to confusion. Alert to self now. Patient verbal now. Denies pain or discomfort.

## 2017-02-01 NOTE — Progress Notes (Signed)
[facility] Progress Note   Subjective:   Per family at bedside vast improvement from yesterday. Patient laying in bed, answering some questions.   Objective Vitals:   01/31/17 2019 02/01/17 0217 02/01/17 0357 02/01/17 0750  BP: (!) 177/93 (!) 146/82 137/78 (!) 142/80  Pulse: 98 96 95 92  Resp: 17  17 18   Temp: 98.3 F (36.8 C)  98.2 F (36.8 C) 98.2 F (36.8 C)  TempSrc: Oral  Oral Oral  SpO2: 98% 99% 100% 97%  Weight: 67.8 kg (149 lb 7.6 oz)      Physical Exam General: Alert, orientated to self, NAD, thin male resting in bed Heart:RRR, +5/4 systolic murmur Lungs: CTAB, nml WOB Abdomen:soft, NTND Extremities:RLE no edema, L AKA in sock - no stump edema Dialysis Access: LU AVF, +b/t   Filed Weights   01/31/17 0728 01/31/17 1148 01/31/17 2019  Weight: 70.9 kg (156 lb 4.9 oz) 67.9 kg (149 lb 11.1 oz) 67.8 kg (149 lb 7.6 oz)    Intake/Output Summary (Last 24 hours) at 02/01/2017 1145 Last data filed at 02/01/2017 0600 Gross per 24 hour  Intake 945 ml  Output 3000 ml  Net -2055 ml    Additional Objective Labs: Basic Metabolic Panel: Recent Labs  Lab 01/30/17 0710 01/31/17 0755 02/01/17 0019  NA 138 137 140  K 5.4* 5.8* 4.7  CL 95* 95* 98*  CO2 26 22 23   GLUCOSE 78 129* 155*  BUN 52* 65* 31*  CREATININE 8.55* 9.99* 5.72*  CALCIUM 9.6 9.3 9.7  PHOS  --  7.1*  --    Liver Function Tests: Recent Labs  Lab 01/29/17 0900 01/31/17 0755  AST 28  --   ALT 8*  --   ALKPHOS 97  --   BILITOT 1.3*  --   PROT 7.6  --   ALBUMIN 2.7* 2.4*   No results for input(s): LIPASE, AMYLASE in the last 168 hours. CBC: Recent Labs  Lab 01/29/17 0900 01/30/17 0710 01/31/17 0755 02/01/17 0019  WBC 9.1 9.9 13.5* 12.5*  NEUTROABS 7.1  --   --   --   HGB 8.9* 9.9* 8.9* 9.9*  HCT 29.7* 32.2* 29.3* 32.0*  MCV 90.8 90.7 90.4 90.4  PLT 248 230 213 207   Blood Culture    Component Value Date/Time   SDES BLOOD LEFT HAND 01/30/2017 1209   SPECREQUEST IN  PEDIATRIC BOTTLE Blood Culture adequate volume 01/30/2017 1209   CULT NO GROWTH 2 DAYS 01/30/2017 1209   REPTSTATUS PENDING 01/30/2017 1209    CBG: Recent Labs  Lab 01/31/17 2017 02/01/17 0037 02/01/17 0354 02/01/17 0747 02/01/17 1132  GLUCAP 147* 152* 143* 143* 133*    Studies/Results: Ct Head Wo Contrast  Result Date: 01/30/2017 CLINICAL DATA:  Change in responsiveness.  Lethargy. EXAM: CT HEAD WITHOUT CONTRAST TECHNIQUE: Contiguous axial images were obtained from the base of the skull through the vertex without intravenous contrast. COMPARISON:  January 29, 2017 FINDINGS: Brain: No subdural, epidural, or subarachnoid hemorrhage. The cerebellum, brainstem, and basal cisterns are normal. Ventricles and sulci are unchanged. Scattered mild white matter changes again identified. No evidence of acute cortical ischemia. 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 No other infarcts. Vascular: Calcified atherosclerotic changes in the carotids within the siphons. Skull: Normal. Negative for fracture or focal lesion. Sinuses/Orbits: No acute finding. Other: None. IMPRESSION: No acute intracranial abnormalities. Electronically Signed   By: Dorise Bullion III M.D   On: 01/30/2017  21:54    Medications: . sodium chloride    . cefTRIAXone (ROCEPHIN)  IV Stopped (01/31/17 1447)  . dextrose 1 mL (01/30/17 1414)  . famotidine (PEPCID) IV Stopped (01/31/17 1659)  . vancomycin 750 mg (01/31/17 1048)   . amLODipine  5 mg Oral QHS  . aspirin EC  162 mg Oral Daily  . atorvastatin  40 mg Oral q1800  . carvedilol  12.5 mg Oral BID WC  . clopidogrel  75 mg Oral q1800  . feeding supplement (NEPRO CARB STEADY)  237 mL Oral BID BM  . feeding supplement (PRO-STAT SUGAR FREE 64)  30 mL Oral BID  . ferric citrate  420 mg Oral TID WC  . heparin injection (subcutaneous)  5,000 Units Subcutaneous Q8H  . mouth rinse  15 mL Mouth Rinse BID  . sodium chloride flush  3 mL  Intravenous Q12H    Dialysis Orders: MWF NW 4h   400/800   70kg   2/2 bath  AVF   Hep 4000 - Mircera 225mcg IV q 2 weeks (last given during [facility] admit) - Venofer 100mg  x 5 ordered (3 given) - Hectoral 35mcg IV q HD - BMM: Auryxia 1/meals.   Assessment/Plan: 1. AMS - waxing and wanning - per primary 2. ESRD - continue regular schedule MWF, K improved 4.7 3. Anemia of CKD- Hgb 9.9, monitor 4. Secondary hyperparathyroidism - labs in goal. Continue VDRA, binders. 5. HTN/volume - BP elevated, titrate down volume as tolerated.  6. Nutrition - Renal diet w/ fluid restriction.  7. AKA  Jen Mow, [facility] Pager: [phone] 02/01/2017,11:45 AM  LOS: 2 days   Pt seen, examined and agree w A/P as above.  Kelly Splinter MD [employer] pager [phone]   02/01/2017, 4:54 PM

## 2017-02-02 LAB — CBC
HCT: 29.8 % — ABNORMAL LOW (ref 39.0–52.0)
Hemoglobin: 9 g/dL — ABNORMAL LOW (ref 13.0–17.0)
MCH: 27.1 pg (ref 26.0–34.0)
MCHC: 30.2 g/dL (ref 30.0–36.0)
MCV: 89.8 fL (ref 78.0–100.0)
PLATELETS: 235 10*3/uL (ref 150–400)
RBC: 3.32 MIL/uL — AB (ref 4.22–5.81)
RDW: 17.7 % — AB (ref 11.5–15.5)
WBC: 9.7 10*3/uL (ref 4.0–10.5)

## 2017-02-02 LAB — RENAL FUNCTION PANEL
Albumin: 2.4 g/dL — ABNORMAL LOW (ref 3.5–5.0)
Anion gap: 16 — ABNORMAL HIGH (ref 5–15)
BUN: 52 mg/dL — AB (ref 6–20)
CHLORIDE: 94 mmol/L — AB (ref 101–111)
CO2: 24 mmol/L (ref 22–32)
CREATININE: 7.93 mg/dL — AB (ref 0.61–1.24)
Calcium: 9.3 mg/dL (ref 8.9–10.3)
GFR, EST AFRICAN AMERICAN: 8 mL/min — AB (ref >60)
GFR, EST NON AFRICAN AMERICAN: 7 mL/min — AB (ref >60)
Glucose, Bld: 108 mg/dL — ABNORMAL HIGH (ref 65–99)
PHOSPHORUS: 5.7 mg/dL — AB (ref 2.5–4.6)
POTASSIUM: 4.8 mmol/L (ref 3.5–5.1)
Sodium: 134 mmol/L — ABNORMAL LOW (ref 135–145)

## 2017-02-02 LAB — GLUCOSE, CAPILLARY: Glucose-Capillary: 88 mg/dL (ref 65–99)

## 2017-02-02 MED ORDER — SODIUM CHLORIDE 0.9 % IV SOLN: 100.0000 mL | INTRAVENOUS | Status: DC | PRN

## 2017-02-02 MED ORDER — LIDOCAINE-PRILOCAINE 2.5-2.5 % EX CREA: 1.0000 "application " | TOPICAL_CREAM | CUTANEOUS | Status: DC | PRN

## 2017-02-02 MED ORDER — PENTAFLUOROPROP-TETRAFLUOROETH EX AERO: 1.0000 "application " | INHALATION_SPRAY | CUTANEOUS | Status: DC | PRN

## 2017-02-02 MED ORDER — ALTEPLASE 2 MG IJ SOLR: 2.0000 mg | Freq: Once | INTRAMUSCULAR | Status: DC | PRN

## 2017-02-02 MED ORDER — DARBEPOETIN ALFA 150 MCG/0.3ML IJ SOSY
150.0000 ug | PREFILLED_SYRINGE | INTRAMUSCULAR | Status: DC
  Administered 2017-02-04: 150 ug via INTRAVENOUS
  Filled 2017-02-02: qty 0.3

## 2017-02-02 MED ORDER — HEPARIN SODIUM (PORCINE) 1000 UNIT/ML DIALYSIS
20.0000 [IU]/kg | INTRAMUSCULAR | Status: DC | PRN
  Administered 2017-02-02: 1400 [IU] via INTRAVENOUS_CENTRAL

## 2017-02-02 MED ORDER — LIDOCAINE HCL (PF) 1 % IJ SOLN: 5.0000 mL | INTRAMUSCULAR | Status: DC | PRN

## 2017-02-02 MED ORDER — HEPARIN SODIUM (PORCINE) 1000 UNIT/ML DIALYSIS: 1000.0000 [IU] | INTRAMUSCULAR | Status: DC | PRN

## 2017-02-02 NOTE — Evaluation (Signed)
Occupational Therapy Evaluation Patient Details Name: Matthew Kline Kline MRN: [id number] DOB: 1960/04/10 Today's Date: 02/02/2017    History of Present Illness Matthew Kline is Matthew Kline 57 year old male with chronic osteomyelitis status post left AKA in the beginning of January, ESRD on hemodialysis, and chronic anemia. He presented from rehab center for Matthew Kline fall and change in mental status.  Patient was recently discharged from hospital on January 19, 2017 on Keflex by orthopedics.     Clinical Impression   Matthew Kline very confused on my arrival to initiate OT evaluation. He was able to read therapist's name tag and seemed very distracted by visual stimuli throughout session only able to sustain attention to task for approximately 20 seconds at Matthew Kline time. He requires max assist for LB ADL, mod assist for UB ADL and grooming tasks, and min assist for self-feeding all with verbal and visual cues for sequencing. Matthew Kline presents with decreased attention, decreased orientation, and decreased motor planning skills impacting his ability to participate in ADL at PLOF. He is very frustrated with his situation and is aware that he is confused at times. Matthew Kline would benefit from continued OT services while admitted to improve independence and safety with ADL and functional mobility. Recommend continued rehabilitation at SNF level once medically appropriate for D/C. Will continue to follow.     Follow Up Recommendations  SNF;Supervision/Assistance - 24 hour    Equipment Recommendations  Other (comment)(TBD at next venue of care)    Recommendations for Other Services       Precautions / Restrictions Precautions Precautions: Fall Restrictions Weight Bearing Restrictions: Yes LLE Weight Bearing: Non weight bearing      Mobility Bed Mobility Overal bed mobility: Needs Assistance Bed Mobility: Supine to Sit;Sit to Supine     Supine to sit: Min assist Sit to supine: Mod assist   General bed mobility comments: Min assist to sit at EOB  from supine with max encouragement and cues to initiate task. Mod assist to return to bed as Matthew Kline unable to sequence.   Transfers                 General transfer comment: unsafe to attempt due to confusion    Balance Overall balance assessment: History of Falls;Needs assistance Sitting-balance support: Feet supported;No upper extremity supported Sitting balance-Leahy Scale: Fair                                     ADL either performed or assessed with clinical judgement   ADL Overall ADL's : Needs assistance/impaired Eating/Feeding: Minimal assistance;Sitting   Grooming: Sitting;Moderate assistance Grooming Details (indicate cue type and reason): hand over hand to initiate but once beginning Matthew Kline task is able to take it over to complete Upper Body Bathing: Maximal assistance;Sitting   Lower Body Bathing: Maximal assistance;Sitting/lateral leans   Upper Body Dressing : Maximal assistance;Sitting   Lower Body Dressing: Maximal assistance;Sitting/lateral leans                 General ADL Comments: Matthew Kline limited by cognitive deficits.      Vision   Vision Assessment?: Vision impaired- to be further tested in functional context Additional Comments: Matthew Kline with difficulty locating items throughout the room but difficult to assess vision fully due to cognitive deficits. He is able to read well.      Perception     Praxis Praxis Praxis tested?: Deficits Deficits: Ideation;Initiation;Organization    Pertinent Vitals/Pain Pain  Assessment: No/denies pain     Hand Dominance Right   Extremity/Trunk Assessment Upper Extremity Assessment Upper Extremity Assessment: Overall WFL for tasks assessed   Lower Extremity Assessment Lower Extremity Assessment: Generalized weakness LLE Deficits / Details: Recent L AKA       Communication Communication Communication: No difficulties   Cognition Arousal/Alertness: Awake/alert Behavior During Therapy:  Restless Overall Cognitive Status: Impaired/Different from baseline Area of Impairment: Orientation;Attention;Memory;Following commands;Safety/judgement;Awareness;Problem solving                 Orientation Level: Disoriented to;Place;Time;Situation Current Attention Level: Sustained Memory: Decreased short-term memory Following Commands: Follows one step commands with increased time;Follows one step commands consistently Safety/Judgement: Decreased awareness of safety Awareness: Intellectual Problem Solving: Slow processing;Decreased initiation;Difficulty sequencing;Requires verbal cues General Comments: Matthew Kline very confused and aware that he is confused but is unsure why. Able to determine that he is in the hospital with multiple choices given and use context clues to determine the date. Continued to speak off topic throughout session today frequently talking about grocery store aisles and items and then stating "why am I saying that." Constantly reading objects around him. Unable to problem solve use of anti-perspirant and requires hand over hand assist to initiate tasks.    General Comments       Exercises     Shoulder Instructions      Home Living Family/patient expects to be discharged to:: Skilled nursing facility Living Arrangements: Children Available Help at Discharge: Family;Available PRN/intermittently Type of Home: House Home Access: Stairs to enter [employer] of Steps: 1 Entrance Stairs-Rails: Right Home Layout: One level     Bathroom Shower/Tub: Teacher, early years/pre: Standard     Home Equipment: Kasandra Knudsen - single point   Additional Comments: Patient lives with 76 year old daughter in 2 bedroom apartment.       Prior Functioning/Environment Level of Independence: Independent with assistive device(s)        Comments: Independent with mobility prior to Jan. 2019. Was using [employer] after his fall in January but unsure of mobility level  while at [facility]        OT Problem List: Decreased strength;Decreased activity tolerance;Decreased range of motion;Impaired balance (sitting and/or standing);Decreased safety awareness;Decreased cognition;Decreased knowledge of use of DME or AE;Decreased knowledge of precautions;Pain      OT Treatment/Interventions: Self-care/ADL training;Therapeutic exercise;Energy conservation;DME and/or AE instruction;Therapeutic activities;Cognitive remediation/compensation;Patient/family education;Balance training;Visual/perceptual remediation/compensation    OT Goals(Current goals can be found in the care plan section) Acute Rehab OT Goals Patient Stated Goal: not stated, Matthew Kline confused. OT Goal Formulation: Patient unable to participate in goal setting Time For Goal Achievement: 02/16/17 Potential to Achieve Goals: Good ADL Goals Matthew Kline Will Perform Grooming: with set-up;sitting Matthew Kline Will Perform Upper Body Dressing: with supervision;sitting Matthew Kline Will Perform Lower Body Dressing: with min assist;sit to/from stand Matthew Kline Will Transfer to Toilet: with min assist;stand pivot transfer;bedside commode(drop-arm BSC) Additional ADL Goal #1: Matthew Kline will demonstrate emergent awareness during seated grooming tasks in Matthew Kline non-distracting environment. Additional ADL Goal #2: Matthew Kline will demonstrate improved ideational praxis skills to follow through with appropriate task when handed Matthew Kline toothbrush with no more than 1 visual cue.  OT Frequency: Min 2X/week   Barriers to D/C:            Co-evaluation              AM-PAC Matthew Kline "6 Clicks" Daily Activity     Outcome Measure Help from another person eating meals?: Matthew Kline Little Help from  another person taking care of personal grooming?: Matthew Kline Lot Help from another person toileting, which includes using toliet, bedpan, or urinal?: Total Help from another person bathing (including washing, rinsing, drying)?: Matthew Kline Lot Help from another person to put on and taking off regular upper body clothing?: Matthew Kline  Lot Help from another person to put on and taking off regular lower body clothing?: Matthew Kline Lot 6 Click Score: 12   End of Session    Activity Tolerance: Patient tolerated treatment well Patient left: in bed;with call bell/phone within reach;with bed alarm set  OT Visit Diagnosis: Other symptoms and signs involving cognitive function;Muscle weakness (generalized) (M62.81);Other abnormalities of gait and mobility (R26.89)                Time: 1440-1505 OT Time Calculation (min): 25 min Charges:  OT General Charges $OT Visit: 1 Visit OT Evaluation $OT Eval Moderate Complexity: 1 Mod OT Treatments $Self Care/Home Management : 8-22 mins G-Codes:     Matthew Kline Herrlich, MS OTR/L  Pager: [place] Matthew Kline Kline 02/02/2017, 3:50 PM

## 2017-02-02 NOTE — Progress Notes (Signed)
PROGRESS NOTE    Matthew Kline  RJJ:[id number] DOB: [birth year] DOA: 01/29/2017 PCP: Sandi Mariscal, MD   Brief Narrative: 57 year old male with chronic osteomyelitis status post left AKA in the beginning of January, ESRD on hemodialysis, chronic anemia, presented from rehab center for a fall and change in mental status.  Patient was discharged from hospital on January 20, 2007 on Keflex by orthopedics.  The wound culture from the surgery going staph which is methicillin sensitive.  He was found to have UTI, started on ceftriaxone and admitted for further evaluation.  Assessment & Plan:   #Altered mental status:Likely acute metabolic encephalopathy of unknown etiology concerning for related with any pain medication/sedatives versus infection.  Urine culture negative.  Blood culture still pending. -Repeat CT scan with no change or acute finding.  Patient was alert awake and oriented yesterday.  Today in dialysis he was alert awake talking but not as oriented as yesterday.  MRSA negative therefore discontinue IV vancomycin.  For now I will continue IV ceftriaxone till final culture result. -Ammonia level not elevated, lactate not elevated, chest x-ray with no pneumonia. -EEG with no seizure. -B12, TSH level normal.  #ESRD on hemodialysis, pulmonary edema and vascular congestion likely chronic: Allises today.  Monitor BMP.  #Chronic jerking movement of extremities: The patient's wife reported that it runs in the family and patient has these jerking movement for a long time.  Unchanged.  # Hypertension: Borderline low blood pressure.  I will discontinue Norvasc.    Monitor blood pressure.  #Anemia of chronic kidney disease: Monitor CBC.  Hemoglobin acceptable.  #History of coronary artery disease: Continue aspirin, Coreg, Lipitor, Plavix   DVT prophylaxis: Heparin subcutaneous Code Status: Full code Family Communication: Discussed patient's wife yesterday. Disposition Plan: Currently  admitted, social worker consulted for SNF placement.    Consultants:   Nephrology  Procedures: None Antimicrobials: Discontinue vancomycin on 1/22 and continue ceftriaxone  Subjective: Seen and examined at dialysis unit.  He was alert awake and talking but not as a way discharge yesterday.  His mental status has been waxing and waning. Objective: Vitals:   02/02/17 0930 02/02/17 1000 02/02/17 1030 02/02/17 1100  BP: (!) 105/42 98/72 (!) 96/53 (!) 101/57  Pulse: 74 73 74 74  Resp: 15 16 14 12   Temp:      TempSrc:      SpO2: 99% 100% 100% 100%  Weight:        Intake/Output Summary (Last 24 hours) at 02/02/2017 1121 Last data filed at 02/02/2017 0160 Gross per 24 hour  Intake 170 ml  Output 50 ml  Net 120 ml   Filed Weights   01/31/17 2019 02/01/17 2055 02/02/17 0710  Weight: 67.8 kg (149 lb 7.6 oz) 67.8 kg (149 lb 8 oz) 67.1 kg (147 lb 14.9 oz)    Examination:  General exam: Not in distress Respiratory system: Clear bilateral, respiratory effort normal.  L Cardiovascular system: Regular rate rhythm S1 is normal.  No pedal edema. Gastrointestinal system: Abdomen soft, nontender.  Bowel sounds positive.. Central nervous system: Alert awake but not as oriented as yesterday. Skin: No rashes, lesions or ulcers    Data Reviewed: I have personally reviewed following labs and imaging studies  CBC: Recent Labs  Lab 01/29/17 0900 01/30/17 0710 01/31/17 0755 02/01/17 0019 02/02/17 0700  WBC 9.1 9.9 13.5* 12.5* 9.7  NEUTROABS 7.1  --   --   --   --   HGB 8.9* 9.9* 8.9* 9.9* 9.0*  HCT 29.7* 32.2*  29.3* 32.0* 29.8*  MCV 90.8 90.7 90.4 90.4 89.8  PLT 248 230 213 207 676   Basic Metabolic Panel: Recent Labs  Lab 01/29/17 1726 01/30/17 0710 01/31/17 0755 02/01/17 0019 02/02/17 0700  NA 135 138 137 140 134*  K 4.9 5.4* 5.8* 4.7 4.8  CL 94* 95* 95* 98* 94*  CO2 26 26 22 23 24   GLUCOSE 101* 78 129* 155* 108*  BUN 45* 52* 65* 31* 52*  CREATININE 8.08* 8.55* 9.99*  5.72* 7.93*  CALCIUM 9.4 9.6 9.3 9.7 9.3  PHOS  --   --  7.1*  --  5.7*   GFR: Estimated Creatinine Clearance: 9.8 mL/min (A) (by C-G formula based on SCr of 7.93 mg/dL (H)). Liver Function Tests: Recent Labs  Lab 01/29/17 0900 01/31/17 0755 02/02/17 0700  AST 28  --   --   ALT 8*  --   --   ALKPHOS 97  --   --   BILITOT 1.3*  --   --   PROT 7.6  --   --   ALBUMIN 2.7* 2.4* 2.4*   No results for input(s): LIPASE, AMYLASE in the last 168 hours. Recent Labs  Lab 01/29/17 1726  AMMONIA 19   Coagulation Profile: No results for input(s): INR, PROTIME in the last 168 hours. Cardiac Enzymes: No results for input(s): CKTOTAL, CKMB, CKMBINDEX, TROPONINI in the last 168 hours. BNP (last 3 results) No results for input(s): PROBNP in the last 8760 hours. HbA1C: No results for input(s): HGBA1C in the last 72 hours. CBG: Recent Labs  Lab 02/01/17 0354 02/01/17 0747 02/01/17 1132 02/01/17 2054 02/02/17 1023  GLUCAP 143* 143* 133* 117* 88   Lipid Profile: No results for input(s): CHOL, HDL, LDLCALC, TRIG, CHOLHDL, LDLDIRECT in the last 72 hours. Thyroid Function Tests: Recent Labs    02/01/17 1030  TSH 1.210   Anemia Panel: Recent Labs    02/01/17 0026  VITAMINB12 833   Sepsis Labs: Recent Labs  Lab 01/29/17 2020  LATICACIDVEN 0.57    Recent Results (from the past 240 hour(s))  Urine culture     Status: None   Collection Time: 01/29/17 11:41 AM  Result Value Ref Range Status   Specimen Description URINE, CLEAN CATCH  Final   Special Requests NONE  Final   Culture NO GROWTH  Final   Report Status 01/30/2017 FINAL  Final  MRSA PCR Screening     Status: None   Collection Time: 01/30/17 12:06 AM  Result Value Ref Range Status   MRSA by PCR NEGATIVE NEGATIVE Final    Comment:        The GeneXpert MRSA Assay (FDA approved for NASAL specimens only), is one component of a comprehensive MRSA colonization surveillance program. It is not intended to diagnose  MRSA infection nor to guide or monitor treatment for MRSA infections.   Culture, blood (routine x 2)     Status: None (Preliminary result)   Collection Time: 01/30/17 12:02 PM  Result Value Ref Range Status   Specimen Description BLOOD RIGHT ANTECUBITAL  Final   Special Requests   Final    BOTTLES DRAWN AEROBIC ONLY Blood Culture adequate volume   Culture NO GROWTH 3 DAYS  Final   Report Status PENDING  Incomplete  Culture, blood (routine x 2)     Status: None (Preliminary result)   Collection Time: 01/30/17 12:09 PM  Result Value Ref Range Status   Specimen Description BLOOD LEFT HAND  Final   Special Requests  IN PEDIATRIC BOTTLE Blood Culture adequate volume  Final   Culture NO GROWTH 3 DAYS  Final   Report Status PENDING  Incomplete         Radiology Studies: No results found.      Scheduled Meds: . amLODipine  5 mg Oral QHS  . aspirin EC  162 mg Oral Daily  . atorvastatin  40 mg Oral q1800  . carvedilol  12.5 mg Oral BID WC  . clopidogrel  75 mg Oral q1800  . [START ON 02/04/2017] darbepoetin (ARANESP) injection - DIALYSIS  150 mcg Intravenous Q Fri-HD  . feeding supplement (NEPRO CARB STEADY)  237 mL Oral BID BM  . feeding supplement (PRO-STAT SUGAR FREE 64)  30 mL Oral BID  . ferric citrate  420 mg Oral TID WC  . heparin injection (subcutaneous)  5,000 Units Subcutaneous Q8H  . sodium chloride flush  3 mL Intravenous Q12H   Continuous Infusions: . sodium chloride    . sodium chloride    . sodium chloride    . cefTRIAXone (ROCEPHIN)  IV Stopped (02/01/17 1800)     LOS: 3 days    Chrishun Scheer Tanna Furry, MD [facility] Pager [phone]  If 7PM-7AM, please contact night-coverage [email or web address] Password [facility] 02/02/2017, 11:21 AM

## 2017-02-02 NOTE — Progress Notes (Signed)
PT Cancellation Note  Patient Details Name: Matthew Kline MRN: [id number] DOB: [birth year]   Cancelled Treatment:    Reason Eval/Treat Not Completed: Patient at procedure or test/unavailable. Pt at HD. PT will continue to f/u with pt as available.    Clearnce Sorrel Therese Rocco 02/02/2017, 7:40 AM

## 2017-02-02 NOTE — Progress Notes (Signed)
[facility] Progress Note   Subjective:   Patient seen while on HD. Watching tv. Tries to answer questions but confused.  Has not touched breakfast.  Objective Vitals:   02/02/17 0900 02/02/17 0930 02/02/17 1000 02/02/17 1030  BP: (!) 99/57 (!) 105/42 98/72 (!) 96/53  Pulse: 75 74 73 74  Resp: 17 15 16 14   Temp:      TempSrc:      SpO2: 99% 99% 100% 100%  Weight:       Physical Exam General:NAD, thin male Heart: RRR, +2/9 systolic murmur Lungs:CTAB, nml WOB Abdomen:soft, NTND Extremities: RLE, no edema L AKA in sock Dialysis Access: LU AVF cannulated   Filed Weights   01/31/17 2019 02/01/17 2055 02/02/17 0710  Weight: 67.8 kg (149 lb 7.6 oz) 67.8 kg (149 lb 8 oz) 67.1 kg (147 lb 14.9 oz)    Intake/Output Summary (Last 24 hours) at 02/02/2017 1037 Last data filed at 02/02/2017 5284 Gross per 24 hour  Intake 170 ml  Output 50 ml  Net 120 ml    Additional Objective Labs: Basic Metabolic Panel: Recent Labs  Lab 01/31/17 0755 02/01/17 0019 02/02/17 0700  NA 137 140 134*  K 5.8* 4.7 4.8  CL 95* 98* 94*  CO2 22 23 24   GLUCOSE 129* 155* 108*  BUN 65* 31* 52*  CREATININE 9.99* 5.72* 7.93*  CALCIUM 9.3 9.7 9.3  PHOS 7.1*  --  5.7*   Liver Function Tests: Recent Labs  Lab 01/29/17 0900 01/31/17 0755 02/02/17 0700  AST 28  --   --   ALT 8*  --   --   ALKPHOS 97  --   --   BILITOT 1.3*  --   --   PROT 7.6  --   --   ALBUMIN 2.7* 2.4* 2.4*   No results for input(s): LIPASE, AMYLASE in the last 168 hours. CBC: Recent Labs  Lab 01/29/17 0900 01/30/17 0710 01/31/17 0755 02/01/17 0019 02/02/17 0700  WBC 9.1 9.9 13.5* 12.5* 9.7  NEUTROABS 7.1  --   --   --   --   HGB 8.9* 9.9* 8.9* 9.9* 9.0*  HCT 29.7* 32.2* 29.3* 32.0* 29.8*  MCV 90.8 90.7 90.4 90.4 89.8  PLT 248 230 213 207 235   Blood Culture    Component Value Date/Time   SDES BLOOD LEFT HAND 01/30/2017 1209   SPECREQUEST IN PEDIATRIC BOTTLE Blood Culture adequate volume  01/30/2017 1209   CULT NO GROWTH 3 DAYS 01/30/2017 1209   REPTSTATUS PENDING 01/30/2017 1209    CBG: Recent Labs  Lab 02/01/17 0354 02/01/17 0747 02/01/17 1132 02/01/17 2054 02/02/17 1023  GLUCAP 143* 143* 133* 117* 88   Studies/Results: No results found.  Medications: . sodium chloride    . sodium chloride    . sodium chloride    . cefTRIAXone (ROCEPHIN)  IV Stopped (02/01/17 1800)   . amLODipine  5 mg Oral QHS  . aspirin EC  162 mg Oral Daily  . atorvastatin  40 mg Oral q1800  . carvedilol  12.5 mg Oral BID WC  . clopidogrel  75 mg Oral q1800  . feeding supplement (NEPRO CARB STEADY)  237 mL Oral BID BM  . feeding supplement (PRO-STAT SUGAR FREE 64)  30 mL Oral BID  . ferric citrate  420 mg Oral TID WC  . heparin injection (subcutaneous)  5,000 Units Subcutaneous Q8H  . sodium chloride flush  3 mL Intravenous Q12H    Dialysis Orders: MWF  NW 4h 400/800 70kg 2/2 bath AVF Hep 4000 -Mircera 259mcg IV q 2 weeks (last given during [facility] admit) - Venofer 100mg  x 5 ordered (3 given) - Hectoral 72mcg IV q HD - BMM: Auryxia 1/meals.   Assessment/Plan: 1. AMS - worse today. Per primary 2. ESRD - HD today per regular schedule. K 4.8. Continue MWF schedule.  3. Anemia of CKD- Hgb 9.0, follow. Start ESA. 4. Secondary hyperparathyroidism - labs in goal. Continue VDRA, binders. 5. HTN/volume - BP improved with HD. Minimal UF goals due to low intake.  6. Nutrition - Renal diet w/ fluid restriction.  7. AKA recent    Jen Mow, PA-C [facility] Pager: [phone] 02/02/2017,10:37 AM  LOS: 3 days   Pt seen, examined and agree w A/P as above.  Kelly Splinter MD [employer] pager [phone]   02/02/2017, 3:31 PM

## 2017-02-03 ENCOUNTER — Inpatient Hospital Stay (INDEPENDENT_AMBULATORY_CARE_PROVIDER_SITE_OTHER): Payer: Medicare HMO | Admitting: Orthopedic Surgery

## 2017-02-03 LAB — GLUCOSE, CAPILLARY: Glucose-Capillary: 115 mg/dL — ABNORMAL HIGH (ref 65–99)

## 2017-02-03 MED ORDER — ACETAMINOPHEN 325 MG PO TABS
650.0000 mg | ORAL_TABLET | Freq: Four times a day (QID) | ORAL | Status: DC | PRN
  Administered 2017-02-03 – 2017-02-06 (×4): 650 mg via ORAL
  Filled 2017-02-03 (×4): qty 2

## 2017-02-03 MED ORDER — THIAMINE HCL 100 MG/ML IJ SOLN
500.0000 mg | Freq: Every day | INTRAMUSCULAR | Status: AC
  Administered 2017-02-03 – 2017-02-05 (×3): 500 mg via INTRAVENOUS
  Filled 2017-02-03 (×4): qty 5

## 2017-02-03 NOTE — Progress Notes (Signed)
Physical Therapy Treatment Patient Details Name: Matthew Kline MRN: [id number] DOB: Mar 07, 1960 Today's Date: 02/03/2017    History of Present Illness Pt is a 57 year old male with chronic osteomyelitis status post left AKA in the beginning of January, ESRD on hemodialysis, and chronic anemia. He presented from rehab center for a fall and change in mental status.  Patient was recently discharged from hospital on January 19, 2017 on Keflex by orthopedics.      PT Comments    Pt remains limited secondary to cognitive deficits and confusion. Pt appeared to demonstrate great difficulty with expressive and receptive language skills this session, which limited his ability to participate in therapeutic interventions. Pt would continue to benefit from skilled physical therapy services at this time while admitted and after d/c to address the below listed limitations in order to improve overall safety and independence with functional mobility.   Follow Up Recommendations  SNF     Equipment Recommendations  None recommended by PT    Recommendations for Other Services       Precautions / Restrictions Precautions Precautions: Fall Restrictions Weight Bearing Restrictions: Yes LLE Weight Bearing: Non weight bearing    Mobility  Bed Mobility Overal bed mobility: Needs Assistance Bed Mobility: Supine to Sit;Sit to Supine     Supine to sit: Min assist Sit to supine: Min assist   General bed mobility comments: Min assist to sit at EOB from supine with max encouragement and cues to initiate task. Assist to return to bed as pt unable to sequence.   Transfers                 General transfer comment: unable to attempt at this time secondary to cognitive deficits  Ambulation/Gait                 Stairs            Wheelchair Mobility    Modified Rankin (Stroke Patients Only)       Balance Overall balance assessment: History of Falls;Needs  assistance Sitting-balance support: Feet supported;No upper extremity supported Sitting balance-Leahy Scale: Fair                                      Cognition Arousal/Alertness: Awake/alert Behavior During Therapy: Flat affect Overall Cognitive Status: Impaired/Different from baseline Area of Impairment: Orientation;Attention;Memory;Following commands;Safety/judgement;Awareness;Problem solving                 Orientation Level: Disoriented to;Time;Situation Current Attention Level: Focused Memory: Decreased short-term memory;Decreased recall of precautions Following Commands: Follows one step commands inconsistently Safety/Judgement: Decreased awareness of deficits;Decreased awareness of safety Awareness: Intellectual Problem Solving: Slow processing;Decreased initiation;Difficulty sequencing;Requires verbal cues;Requires tactile cues        Exercises      General Comments        Pertinent Vitals/Pain Pain Assessment: Faces Faces Pain Scale: No hurt    Home Living                      Prior Function            PT Goals (current goals can now be found in the care plan section) Acute Rehab PT Goals PT Goal Formulation: Patient unable to participate in goal setting Time For Goal Achievement: 02/15/17 Potential to Achieve Goals: Fair Progress towards PT goals: Progressing toward goals    Frequency    Min 2X/week  PT Plan Current plan remains appropriate;Frequency needs to be updated    Co-evaluation              AM-PAC PT "6 Clicks" Daily Activity  Outcome Measure  Difficulty turning over in bed (including adjusting bedclothes, sheets and blankets)?: None Difficulty moving from lying on back to sitting on the side of the bed? : Unable Difficulty sitting down on and standing up from a chair with arms (e.g., wheelchair, bedside commode, etc,.)?: Unable Help needed moving to and from a bed to chair (including a  wheelchair)?: A Lot Help needed walking in hospital room?: Total Help needed climbing 3-5 steps with a railing? : Total 6 Click Score: 10    End of Session   Activity Tolerance: Other (comment)(tx limited secondary to pt's poor cognition/understanding) Patient left: in bed;with call bell/phone within reach;with bed alarm set Nurse Communication: Mobility status PT Visit Diagnosis: Other abnormalities of gait and mobility (R26.89)     Time: 1610-9604 PT Time Calculation (min) (ACUTE ONLY): 15 min  Charges:  $Therapeutic Activity: 8-22 mins                    G Codes:       [place], PT, [place] 02/03/2017, 2:56 PM

## 2017-02-03 NOTE — Progress Notes (Signed)
Patient hardly slept last night. Continue to be confused. Alert to self only. Stump redressed twice. Moderate amount of serosangenous discharge. Stump remains swollen. Kept elevated.

## 2017-02-03 NOTE — Progress Notes (Addendum)
PROGRESS NOTE    Matthew Kline  LNL:[id number] DOB: 01-05-1961 DOA: 01/29/2017 PCP: Sandi Mariscal, MD   Brief Narrative: 57 year old male with chronic osteomyelitis status post left AKA in the beginning of January, ESRD on hemodialysis, chronic anemia, presented from rehab center for a fall and change in mental status.  Patient was discharged from hospital on January 20, 2007 on Keflex by orthopedics.  The wound culture from the surgery going staph which is methicillin sensitive.  He was found to have UTI, started on ceftriaxone and admitted for further evaluation.  Assessment & Plan:   #Altered mental status:Likely acute metabolic encephalopathy of unknown etiology concerning for related with any pain medication/sedatives versus infection.  Urine culture negative, UTI ruled out.  Blood culture still pending. -Repeat CT scan with no change or acute finding.  Patient has mental status waxing and waning.  Today he is alert awake and oriented to his name.  He is able to eat with assistance and talking.  He sometimes talking which is not comprehensible or out of context.  He is workup including: -Ammonia level not elevated, lactate not elevated, chest x-ray with no pneumonia. -EEG with no seizure. -B12, TSH level normal.  I reviewed the above with neurologist Dr.Arora.  Recommended to start thiamine 500 mg IV daily for 3 days and then maintenance dose with close monitoring of the mental status.  Patient has no history of alcohol use.  Continue to monitor for now.  I have discussed this with the patient's wife at bedside. -Continue feeding with assistance.  #ESRD on hemodialysis, pulmonary edema and vascular congestion likely chronic: Maintenance hemodialysis tomorrow.  Monitor BMP.  #Left leg amputation by Dr. Sharol Given: Concerned about some discharge from the wound site requiring frequent dressing change as per nurse.  I consulted Dr.Duda  for evaluation.  Continue dressing change and supportive  care.  #Chronic jerking movement of extremities: The patient's wife reported that it runs in the family and patient has these jerking movement for a long time.  Unchanged.  # Hypertension: Borderline low blood pressure.  I will discontinue Norvasc.    Monitor blood pressure.  #Anemia of chronic kidney disease: Monitor CBC.  IV iron and ESA during dialysis.  Hemoglobin acceptable.  #History of coronary artery disease: Continue aspirin, Coreg, Lipitor, Plavix     DVT prophylaxis: Heparin subcutaneous Code Status: Full code Family Communication: Discussed patient's wife yesterday. Disposition Plan: Social worker evaluation for a skilled nursing home.   Consultants:   Nephrology  Procedures: None Antimicrobials: Discontinue vancomycin on 1/22 and continue ceftriaxone  Subjective: Seen and examined at bedside.  Patient was seen and examined twice today.  Patient was alert awake and oriented to himself, his wife, hospital.  He is more alert than yesterday.  Denies headache, nausea, vomiting, pain.  Review of systems limited.  Discussed with the patient's wife at bedside in second visit. Objective: Vitals:   02/02/17 1805 02/02/17 2057 02/03/17 0544 02/03/17 1011  BP: (!) 144/78 (!) 143/82 (!) 109/59 (!) 157/74  Pulse: 81 78 81 84  Resp: 18 17 18 20   Temp: 98.2 F (36.8 C) 98.3 F (36.8 C) 98.6 F (37 C) 98.3 F (36.8 C)  TempSrc: Oral Oral Oral Oral  SpO2: 99% 99% 98% 99%  Weight:  66.5 kg (146 lb 9.7 oz)      Intake/Output Summary (Last 24 hours) at 02/03/2017 1304 Last data filed at 02/03/2017 1011 Gross per 24 hour  Intake 650 ml  Output 0 ml  Net 650 ml   Filed Weights   02/02/17 0710 02/02/17 1118 02/02/17 2057  Weight: 67.1 kg (147 lb 14.9 oz) 66.3 kg (146 lb 2.6 oz) 66.5 kg (146 lb 9.7 oz)    Examination:  General exam: Lying in bed, not in distress Respiratory system: Clear bilateral, respiratory for normal Cardiovascular system: Regular rate rhythm S1-S2  normal.  No pedal edema. Gastrointestinal system: Abdomen soft, nontender.  Bowel sounds positive.. Central nervous system: Alert awake and oriented to hospital and name only. Skin: No rashes, lesions or ulcers    Data Reviewed: I have personally reviewed following labs and imaging studies  CBC: Recent Labs  Lab 01/29/17 0900 01/30/17 0710 01/31/17 0755 02/01/17 0019 02/02/17 0700  WBC 9.1 9.9 13.5* 12.5* 9.7  NEUTROABS 7.1  --   --   --   --   HGB 8.9* 9.9* 8.9* 9.9* 9.0*  HCT 29.7* 32.2* 29.3* 32.0* 29.8*  MCV 90.8 90.7 90.4 90.4 89.8  PLT 248 230 213 207 846   Basic Metabolic Panel: Recent Labs  Lab 01/29/17 1726 01/30/17 0710 01/31/17 0755 02/01/17 0019 02/02/17 0700  NA 135 138 137 140 134*  K 4.9 5.4* 5.8* 4.7 4.8  CL 94* 95* 95* 98* 94*  CO2 26 26 22 23 24   GLUCOSE 101* 78 129* 155* 108*  BUN 45* 52* 65* 31* 52*  CREATININE 8.08* 8.55* 9.99* 5.72* 7.93*  CALCIUM 9.4 9.6 9.3 9.7 9.3  PHOS  --   --  7.1*  --  5.7*   GFR: Estimated Creatinine Clearance: 9.7 mL/min (A) (by C-G formula based on SCr of 7.93 mg/dL (H)). Liver Function Tests: Recent Labs  Lab 01/29/17 0900 01/31/17 0755 02/02/17 0700  AST 28  --   --   ALT 8*  --   --   ALKPHOS 97  --   --   BILITOT 1.3*  --   --   PROT 7.6  --   --   ALBUMIN 2.7* 2.4* 2.4*   No results for input(s): LIPASE, AMYLASE in the last 168 hours. Recent Labs  Lab 01/29/17 1726  AMMONIA 19   Coagulation Profile: No results for input(s): INR, PROTIME in the last 168 hours. Cardiac Enzymes: No results for input(s): CKTOTAL, CKMB, CKMBINDEX, TROPONINI in the last 168 hours. BNP (last 3 results) No results for input(s): PROBNP in the last 8760 hours. HbA1C: No results for input(s): HGBA1C in the last 72 hours. CBG: Recent Labs  Lab 02/01/17 0354 02/01/17 0747 02/01/17 1132 02/01/17 2054 02/02/17 1023  GLUCAP 143* 143* 133* 117* 88   Lipid Profile: No results for input(s): CHOL, HDL, LDLCALC, TRIG,  CHOLHDL, LDLDIRECT in the last 72 hours. Thyroid Function Tests: Recent Labs    02/01/17 1030  TSH 1.210   Anemia Panel: Recent Labs    02/01/17 0026  VITAMINB12 833   Sepsis Labs: Recent Labs  Lab 01/29/17 2020  LATICACIDVEN 0.57    Recent Results (from the past 240 hour(s))  Urine culture     Status: None   Collection Time: 01/29/17 11:41 AM  Result Value Ref Range Status   Specimen Description URINE, CLEAN CATCH  Final   Special Requests NONE  Final   Culture NO GROWTH  Final   Report Status 01/30/2017 FINAL  Final  MRSA PCR Screening     Status: None   Collection Time: 01/30/17 12:06 AM  Result Value Ref Range Status   MRSA by PCR NEGATIVE NEGATIVE Final  Comment:        The GeneXpert MRSA Assay (FDA approved for NASAL specimens only), is one component of a comprehensive MRSA colonization surveillance program. It is not intended to diagnose MRSA infection nor to guide or monitor treatment for MRSA infections.   Culture, blood (routine x 2)     Status: None (Preliminary result)   Collection Time: 01/30/17 12:02 PM  Result Value Ref Range Status   Specimen Description BLOOD RIGHT ANTECUBITAL  Final   Special Requests   Final    BOTTLES DRAWN AEROBIC ONLY Blood Culture adequate volume   Culture NO GROWTH 4 DAYS  Final   Report Status PENDING  Incomplete  Culture, blood (routine x 2)     Status: None (Preliminary result)   Collection Time: 01/30/17 12:09 PM  Result Value Ref Range Status   Specimen Description BLOOD LEFT HAND  Final   Special Requests IN PEDIATRIC BOTTLE Blood Culture adequate volume  Final   Culture NO GROWTH 4 DAYS  Final   Report Status PENDING  Incomplete         Radiology Studies: No results found.      Scheduled Meds: . aspirin EC  162 mg Oral Daily  . atorvastatin  40 mg Oral q1800  . carvedilol  12.5 mg Oral BID WC  . clopidogrel  75 mg Oral q1800  . [START ON 02/04/2017] darbepoetin (ARANESP) injection - DIALYSIS   150 mcg Intravenous Q Fri-HD  . feeding supplement (NEPRO CARB STEADY)  237 mL Oral BID BM  . feeding supplement (PRO-STAT SUGAR FREE 64)  30 mL Oral BID  . ferric citrate  420 mg Oral TID WC  . sodium chloride flush  3 mL Intravenous Q12H   Continuous Infusions: . sodium chloride    . cefTRIAXone (ROCEPHIN)  IV Stopped (02/02/17 1912)  . thiamine injection       LOS: 4 days    Wayde Gopaul Tanna Furry, MD [facility] Pager [phone]  If 7PM-7AM, please contact night-coverage [email or web address] Password [email or web address] 02/03/2017, 1:04 PM

## 2017-02-03 NOTE — Progress Notes (Signed)
[facility] Progress Note   Subjective:   Seen and examined at bedside with wife present.  Stares into space like he is thinking when questions asked, and occasional delayed response. Per wife pulled out IV last night.   Objective Vitals:   02/02/17 1805 02/02/17 2057 02/03/17 0544 02/03/17 1011  BP: (!) 144/78 (!) 143/82 (!) 109/59 (!) 157/74  Pulse: 81 78 81 84  Resp: 18 17 18 20   Temp: 98.2 F (36.8 C) 98.3 F (36.8 C) 98.6 F (37 C) 98.3 F (36.8 C)  TempSrc: Oral Oral Oral Oral  SpO2: 99% 99% 98% 99%  Weight:  66.5 kg (146 lb 9.7 oz)     Physical Exam General:NAD, thin, pale male, resting in bed Heart:RRR Lungs:CTAB anteriorly Abdomen:soft, ND Extremities:RLE: no edema, LLE: AKA, wrapped Dialysis Access: LU AVF +b/t  Filed Weights   02/02/17 0710 02/02/17 1118 02/02/17 2057  Weight: 67.1 kg (147 lb 14.9 oz) 66.3 kg (146 lb 2.6 oz) 66.5 kg (146 lb 9.7 oz)    Intake/Output Summary (Last 24 hours) at 02/03/2017 1030 Last data filed at 02/03/2017 1011 Gross per 24 hour  Intake 650 ml  Output 688 ml  Net -38 ml    Additional Objective Labs: Basic Metabolic Panel: Recent Labs  Lab 01/31/17 0755 02/01/17 0019 02/02/17 0700  NA 137 140 134*  K 5.8* 4.7 4.8  CL 95* 98* 94*  CO2 22 23 24   GLUCOSE 129* 155* 108*  BUN 65* 31* 52*  CREATININE 9.99* 5.72* 7.93*  CALCIUM 9.3 9.7 9.3  PHOS 7.1*  --  5.7*   Liver Function Tests: Recent Labs  Lab 01/29/17 0900 01/31/17 0755 02/02/17 0700  AST 28  --   --   ALT 8*  --   --   ALKPHOS 97  --   --   BILITOT 1.3*  --   --   PROT 7.6  --   --   ALBUMIN 2.7* 2.4* 2.4*   CBC: Recent Labs  Lab 01/29/17 0900 01/30/17 0710 01/31/17 0755 02/01/17 0019 02/02/17 0700  WBC 9.1 9.9 13.5* 12.5* 9.7  NEUTROABS 7.1  --   --   --   --   HGB 8.9* 9.9* 8.9* 9.9* 9.0*  HCT 29.7* 32.2* 29.3* 32.0* 29.8*  MCV 90.8 90.7 90.4 90.4 89.8  PLT 248 230 213 207 235    CBG: Recent Labs  Lab 02/01/17 0354  02/01/17 0747 02/01/17 1132 02/01/17 2054 02/02/17 1023  GLUCAP 143* 143* 133* 117* 88   Medications: . sodium chloride    . cefTRIAXone (ROCEPHIN)  IV Stopped (02/02/17 1912)  . thiamine injection     . aspirin EC  162 mg Oral Daily  . atorvastatin  40 mg Oral q1800  . carvedilol  12.5 mg Oral BID WC  . clopidogrel  75 mg Oral q1800  . [START ON 02/04/2017] darbepoetin (ARANESP) injection - DIALYSIS  150 mcg Intravenous Q Fri-HD  . feeding supplement (NEPRO CARB STEADY)  237 mL Oral BID BM  . feeding supplement (PRO-STAT SUGAR FREE 64)  30 mL Oral BID  . ferric citrate  420 mg Oral TID WC  . sodium chloride flush  3 mL Intravenous Q12H    Dialysis Orders: MWF NW 4h 400/800 70kg 2/2 bath AVF Hep 4000 -Mircera 266mcg IV q 2 weeks (last given during [facility] admit) - Venofer 100mg  x 5 ordered (3 given) - Hectoral 25mcg IV q HD - BMM: Auryxia 1/meals.   Assessment/Plan: 1.AMS?acute  metabolic encephalopathy- etiology unknown - Continues to wax and wane. Slightly better today, but not at baseline.  2. ESRD- HD scheduled for tomorrow per regular schedule.  3. Anemiaof CKD-Hgb 9.0, follow. Aranesp 141mcg qwk starting 1/25 4. Secondary hyperparathyroidism- Cor Ca 10.6, Hectorol stopped. P slightly elevated. Continue binders, may need to switch to powder to sprinkle over food.   5.HTN/volume- BP variable, improved after HD. Under EDW, low intake, volume appears stable. Titrate down as tolerated. Will need new EDW at discharge. 6. Nutrition- Renal diet w/ fluid restriction.  7. Recent L AKA - 01/14/17 Dr. Sharol Given.  8. CAD - per primary 9. Chronic jerking of extremities - chronic, per primary.  But also in ESRD pt's w jerking of extremities must consider underdialysis, and /or neurontin/ narcotic overload.      Jen Mow, PA-C [facility] Pager: [phone] 02/03/2017,10:30 AM  LOS: 4 days   Pt seen, examined, agree w assess/plan as above with  additions as indicated.  Kelly Splinter MD [employer] pager [phone]    cell [phone] 02/03/2017, 2:19 PM

## 2017-02-03 NOTE — Care Management Important Message (Signed)
Important Message  Patient Details  Name: Matthew Kline MRN: [id number] Date of Birth: 28-Jan-1960   Medicare Important Message Given:  No Due to illness patient  Can not sign /unsign copy left with the patient    Orbie Pyo 02/03/2017, 2:10 PM

## 2017-02-03 NOTE — Clinical Social Work Note (Signed)
Clinical Social Work Assessment  Patient Details  Name: Matthew Kline MRN: [id number] Date of Birth: 06/25/1960  Date of referral:  02/03/17               Reason for consult:  Discharge Planning                Permission sought to share information with:  Family Supports(No, patient oriented to self only) Permission granted to share information::  No  Name::     Museum/gallery conservator  Agency::     Relationship::  Wife (separated)  Contact Information:  [phone]  Housing/Transportation Living arrangements for the past 2 months:  Single Family Home Source of Information:  Spouse Patient Interpreter Needed:  None Criminal Activity/Legal Involvement Pertinent to Current Situation/Hospitalization:  No - Comment as needed Significant Relationships:  Spouse, Other Family Members, Other(Comment)(Patient and 93 year old daughter live together) Lives with:  Minor Children Do you feel safe going back to the place where you live?  No(Spouse and family in agreeement with ST rehab) Need for family participation in patient care:  Yes (Comment)  Care giving concerns:  No concerns expressed regarding patient's care at skilled nursing facility.   Social Worker assessment / plan: CSW talked by phone with patient's wife regarding his discharge plan. CSW advised by Mrs. Avans that they are married but don't live together and patient and his 68 year-old daughter live together. Mrs. Witts indicated that she is still involved and she indicated that [name] will return to [facility] at discharge and she has talked with staff at SNF regarding storing his belongings until he returns.  Employment status:  Disabled (Comment on whether or not currently receiving Disability) Insurance information:  Managed Medicare PT Recommendations:  [place] / Referral to community resources:  Other (Comment Required)(None needed or requested as patient from SNF and will return at  discharge)  [name]:  No concerns expressed by wife regarding patient's care during hospitalization.  Patient/Family's Understanding of and Emotional Response to Diagnosis, Current Treatment, and Prognosis:  Wife understands that patient continues to need ongoing nursing and rehab upon discharge from hospital.  Emotional Assessment Appearance:  Other (Comment Required(Did not visit with patient) Attitude/Demeanor/Rapport:  Unable to Assess(Did not visit with patient) Affect (typically observed):  Unable to Assess(Did not visit with patient ) Orientation:  Oriented to Self Alcohol / Substance use:  Tobacco Use, Alcohol Use, Illicit Drugs(Patient reported that he quit smoking and does not drink or use illicit drugs) Psych involvement (Current and /or in the community):  No (Comment)  Discharge Needs  Concerns to be addressed:  Discharge Planning Concerns Readmission within the last 30 days:  Yes Current discharge risk:  None Barriers to Discharge:  Continued Medical Work up   [employer] Mila Homer, LCSW 02/03/2017, 6:29 PM

## 2017-02-03 NOTE — Progress Notes (Signed)
  Speech Language Pathology Treatment: Dysphagia  Patient Details Name: Matthew Kline MRN: [id number] DOB: 02/05/1960 Today's Date: 02/03/2017 Time: 9223-0097 SLP Time Calculation (min) (ACUTE ONLY): 8 min  Assessment / Plan / Recommendation Clinical Impression  Pt inattentive to speaker and task despite interventions. Pt accepted a few sips of water reluctantly, but refused cracker or candy even with cues for self feeding, finally saying "I detest it." No observable difficulty with intake other than severe inattention. Pt calling out to women in the hallway believing them to be his mother and not participating in verbal reasoning about why that would not be his mother. Pt unable to sustain attention to verbal tasks through completion, does not follow commands. No further swallowing needs as oropharyngeal function is adequate. Will complete swallow eval order.   HPI HPI: Pt is a 57 y.o. 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 His wound culture grew out staph aureus methicillin sensitive. Patient has been doing well in rehab. Began experiencing nausea, vomiting, diarrhea, abdominal cramps, seemed more confused than usual, fell on his wound and was then brought to ED for further evaluation- admitted with AMS and UTI. Head CT normal. CXR showed Pulmonary vascular congestion with mild interstitial edema. There may be a degree of congestive heart failure. No consolidation. Pt has been minimally responsive this date. Bedside swallow eval ordered.      SLP Plan  All goals met       Recommendations  Diet recommendations: Regular;Thin liquid Medication Administration: Whole meds with liquid Supervision: Staff to assist with self feeding(likely inattentive to meals) Compensations: Minimize environmental  distractions;Slow rate;Small sips/bites Postural Changes and/or Swallow Maneuvers: Seated upright 90 degrees;Upright 30-60 min after meal                Oral Care Recommendations: Oral care BID Follow up Recommendations: Skilled Nursing facility Plan: All goals met       GO                Henok Heacock, Katherene Ponto 02/03/2017, 4:09 PM

## 2017-02-04 DIAGNOSIS — Z89612 Acquired absence of left leg above knee: Secondary | ICD-10-CM

## 2017-02-04 DIAGNOSIS — IMO0002 Reserved for concepts with insufficient information to code with codable children: Secondary | ICD-10-CM

## 2017-02-04 LAB — RENAL FUNCTION PANEL
ALBUMIN: 2.6 g/dL — AB (ref 3.5–5.0)
ANION GAP: 15 (ref 5–15)
BUN: 49 mg/dL — AB (ref 6–20)
CALCIUM: 9.6 mg/dL (ref 8.9–10.3)
CO2: 26 mmol/L (ref 22–32)
CREATININE: 5.67 mg/dL — AB (ref 0.61–1.24)
Chloride: 93 mmol/L — ABNORMAL LOW (ref 101–111)
GFR calc Af Amer: 12 mL/min — ABNORMAL LOW (ref >60)
GFR calc non Af Amer: 10 mL/min — ABNORMAL LOW (ref >60)
GLUCOSE: 93 mg/dL (ref 65–99)
PHOSPHORUS: 4.8 mg/dL — AB (ref 2.5–4.6)
Potassium: 4 mmol/L (ref 3.5–5.1)
SODIUM: 134 mmol/L — AB (ref 135–145)

## 2017-02-04 LAB — CULTURE, BLOOD (ROUTINE X 2)
Culture: NO GROWTH
Culture: NO GROWTH
SPECIAL REQUESTS: ADEQUATE
Special Requests: ADEQUATE

## 2017-02-04 LAB — CBC
HCT: 30.4 % — ABNORMAL LOW (ref 39.0–52.0)
HEMOGLOBIN: 9.2 g/dL — AB (ref 13.0–17.0)
MCH: 27.2 pg (ref 26.0–34.0)
MCHC: 30.3 g/dL (ref 30.0–36.0)
MCV: 89.9 fL (ref 78.0–100.0)
PLATELETS: 220 10*3/uL (ref 150–400)
RBC: 3.38 MIL/uL — ABNORMAL LOW (ref 4.22–5.81)
RDW: 17 % — AB (ref 11.5–15.5)
WBC: 8.6 10*3/uL (ref 4.0–10.5)

## 2017-02-04 LAB — GLUCOSE, CAPILLARY: Glucose-Capillary: 104 mg/dL — ABNORMAL HIGH (ref 65–99)

## 2017-02-04 MED ORDER — DARBEPOETIN ALFA 150 MCG/0.3ML IJ SOSY
PREFILLED_SYRINGE | INTRAMUSCULAR | Status: AC
  Filled 2017-02-04: qty 0.3

## 2017-02-04 MED ORDER — RENA-VITE PO TABS
1.0000 | ORAL_TABLET | Freq: Every day | ORAL | Status: DC
  Administered 2017-02-04 – 2017-02-06 (×3): 1 via ORAL
  Filled 2017-02-04 (×3): qty 1

## 2017-02-04 MED ORDER — SODIUM CHLORIDE 0.9 % IV SOLN
125.0000 mg | INTRAVENOUS | Status: AC
  Administered 2017-01-31 – 2017-02-04 (×2): 125 mg via INTRAVENOUS
  Filled 2017-02-04 (×4): qty 10

## 2017-02-04 NOTE — NC FL2 (Signed)
[place] LEVEL OF CARE SCREENING TOOL     IDENTIFICATION  Patient Name: Matthew Kline Birthdate: September 09, 1960 Sex: male Admission Date (Current Location): 01/29/2017  [facility] and [place] Number:  Herbalist and Address:  [facility]. [facility], [place] [address], [place], [place] [address]      Provider Number: [id number]  Attending Physician Name and Address:  Rosita Fire, MD  Relative Name and Phone Number:  [name] - wife (separated) [phone]    Current Level of Care: Hospital Recommended Level of Care: Skilled Nursing Facility(From [facility]) Prior Approval Number:    Date Approved/Denied:   PASRR Number: [id number]  Discharge Plan: SNF([facility])    Current Diagnoses: Patient Active Problem List   Diagnosis Date Noted  . Above knee amputation status, left ([place])   . Pressure injury of skin 01/30/2017  . Acute lower UTI 01/29/2017  . Acute metabolic encephalopathy 29/93/7169  . UTI (urinary tract infection) 01/29/2017  . Quadriceps muscle rupture, left, initial encounter   . Fall 01/09/2017  . Gait disturbance 01/09/2017  . Staphylococcus aureus infection 01/09/2017  . Infection of prosthetic left knee joint ([place]) 01/09/2017  . Fever, unknown origin 11/09/2016  . Critical lower limb ischemia 08/25/2016  . Ulcer of left midfoot with fat layer exposed ([place]) 08/13/2016  . Diabetic ulcer of left midfoot associated with type 2 diabetes mellitus, with fat layer exposed ([place]) 07/25/2016  . Peripheral neuropathy 07/22/2016  . Tobacco abuse 07/22/2016  . CAD in native artery 05/18/2016  . CAD, multiple vessel 05/11/2016  . Positive cardiac stress test 05/11/2016  . Abnormal stress test 04/30/2016  . Pre-transplant evaluation for kidney transplant 04/30/2016  . S/P revision of total knee 11/26/2015  . Pain in the chest   . Acute on chronic diastolic heart failure ([place]) 07/05/2015  . Volume overload  07/04/2015  . Shortness of breath 07/04/2015  . Hypoxemia 07/04/2015  . Elevated troponin   . End-stage renal disease on hemodialysis ([place])   . Hypervolemia   . 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 Pyogenic bacterial arthritis of knee, left ([place]) 08/07/2014  . Tachycardia 07/24/2014  . Acute upper respiratory infection 07/24/2014  . ESRD on dialysis ([place]) 07/14/2014  . Type II diabetes mellitus ([place]) 07/14/2014  . Anemia in chronic kidney disease 07/14/2014  . Congestive heart failure (CHF) ([place]) 07/13/2014  . Surgical wound dehiscence 05/09/2014  . Dehiscence of closure of skin 05/09/2014  . Total knee replacement status 04/10/2014  . Diabetes mellitus with renal manifestations, controlled ([place]) 10/24/2013  . Hypertensive renal disease 06/27/2013  . DM type 2 causing vascular disease ([place]) 06/27/2013  . Erectile dysfunction 06/27/2013  . Depression 06/27/2013  . Claudication of left lower extremity ([place]) 12/19/2012  . Essential hypertension, benign 12/19/2012  . Sinusitis, acute maxillary 11/22/2012  . Otitis, externa, infective 11/14/2012  . Leg edema, left 11/14/2012  . End stage renal disease ([place]) 10/02/2012  . Controlled type 2 DM with proteinuria or microalbuminuria 09/19/2012  . GERD (gastroesophageal reflux disease) 09/19/2012  . Leukocytosis 09/19/2012  . Lacunar infarction 08/17/2012  . Polymyalgia rheumatica ([place]) 08/17/2012  . 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 Essential hypertension 05/10/2012  . Vitamin D deficiency 05/10/2012  . Diabetes mellitus due to underlying condition ([place]) 05/10/2012  . Hyperlipidemia LDL goal <100 05/10/2012  . Anemia of chronic disease 05/10/2012  . Screening for prostate cancer 05/10/2012  . Chronic kidney disease (CKD), stage IV (severe) ([place]) 05/10/2012  .  Peripheral autonomic neuropathy due to DM ([place]) 05/10/2012  . Callus of foot 05/10/2012  . Urgency of urination 05/10/2012  . Hyperkalemia 05/10/2012  .  Candidiasis of the esophagus 10/12/2011  . Internal hemorrhoids without mention of complication 27/78/2423  . Pre-syncope 07/25/2009  . DJD (degenerative joint disease) of cervical spine 02/17/2009    Orientation RESPIRATION BLADDER Height & Weight     Self  Normal External catheter(Placed 1/19) Weight: 146 lb 9.7 oz (66.5 kg) Height:     BEHAVIORAL SYMPTOMS/MOOD NEUROLOGICAL BOWEL NUTRITION STATUS      Continent Diet(Renal with 1200 mL fluid restriciton)  AMBULATORY STATUS COMMUNICATION OF NEEDS Skin   Extensive Assist(Patient was unable to ambulate with PT - left AKA) Verbally Other (Comment), Normal(Pressure injury, stage 1 to buttocks)                       Personal Care Assistance Level of Assistance  Bathing, Feeding, Dressing Bathing Assistance: Maximum assistance Feeding assistance: Limited assistance Dressing Assistance: Maximum assistance     Functional Limitations Info  Sight, Hearing, Speech Sight Info: Impaired Hearing Info: Adequate Speech Info: Adequate    SPECIAL CARE FACTORS FREQUENCY  PT (By licensed PT), OT (By licensed OT), Speech therapy     PT Frequency: Evaluated 1/22 and a minimum of 3X per week theray recommended during acute inpatient stay OT Frequency: Evaluated 1/23 and a minimum of 2X per week therapy recommended during acute inpatient stay     Speech Therapy Frequency: Evaluated 1/20 and patient determined to be at aspiration risk      Contractures Contractures Info: Not present    Additional Factors Info  Code Status, Allergies Code Status Info: Full Allergies Info: Morphine and related, Tygacil (Tigecycline)           Current Medications (02/04/2017):  This is the current hospital active medication list Current Facility-Administered Medications  Medication Dose Route Frequency Provider Last Rate Last Dose  . 0.9 %  sodium chloride infusion  250 mL Intravenous PRN Phillips Grout, MD      . acetaminophen (TYLENOL) tablet 650  mg  650 mg Oral Q6H PRN Rosita Fire, MD   650 mg at 02/03/17 1835  . aspirin EC tablet 162 mg  162 mg Oral Daily Derrill Kay A, MD   162 mg at 02/04/17 1036  . atorvastatin (LIPITOR) tablet 40 mg  40 mg Oral q1800 Phillips Grout, MD   40 mg at 02/04/17 1842  . carvedilol (COREG) tablet 12.5 mg  12.5 mg Oral BID WC Mauricia Area, MD   12.5 mg at 02/04/17 1842  . clopidogrel (PLAVIX) tablet 75 mg  75 mg Oral q1800 Phillips Grout, MD   75 mg at 02/04/17 1842  . Darbepoetin Alfa (ARANESP) 150 MCG/0.3ML injection           . Darbepoetin Alfa (ARANESP) injection 150 mcg  150 mcg Intravenous Q Fri-HD Penninger, Lindsay, PA   150 mcg at 02/04/17 1447  . feeding supplement (NEPRO CARB STEADY) liquid 237 mL  237 mL Oral BID BM Derrill Kay A, MD   237 mL at 02/04/17 1036  . feeding supplement (PRO-STAT SUGAR FREE 64) liquid 30 mL  30 mL Oral BID Derrill Kay A, MD   30 mL at 02/04/17 1036  . ferric citrate (AURYXIA) tablet 420 mg  420 mg Oral TID WC Loren Racer, PA-C   420 mg at 02/04/17 1225  . ferric gluconate (NULECIT) 125  mg in sodium chloride 0.9 % 100 mL IVPB  125 mg Intravenous Q M,W,F-HD Roney Jaffe, MD 110 mL/hr at 02/04/17 1454 125 mg at 02/04/17 1454  . hydrALAZINE (APRESOLINE) injection 10 mg  10 mg Intravenous Q6H PRN Rosita Fire, MD   10 mg at 01/31/17 2154  . multivitamin (RENA-VIT) tablet 1 tablet  1 tablet Oral QHS Alric Seton, PA-C      . ondansetron [facility]) tablet 4 mg  4 mg Oral Q6H PRN Phillips Grout, MD       Or  . ondansetron (ZOFRAN) injection 4 mg  4 mg Intravenous Q6H PRN Derrill Kay A, MD      . sodium chloride flush (NS) 0.9 % injection 3 mL  3 mL Intravenous Q12H Derrill Kay A, MD   3 mL at 02/04/17 1037  . sodium chloride flush (NS) 0.9 % injection 3 mL  3 mL Intravenous PRN Derrill Kay A, MD      . thiamine 500mg  in normal saline (41ml) IVPB  500 mg Intravenous Daily Rosita Fire, MD   500 mg at 02/04/17 1927      Discharge Medications: Please see discharge summary for a list of discharge medications.  Relevant Imaging Results:  Relevant Lab Results:   Additional Information ss#[id number]. Dialysis patient MWF [facility]  [place] Demuro [place], [place]

## 2017-02-04 NOTE — Consult Note (Signed)
ORTHOPAEDIC CONSULTATION  REQUESTING PHYSICIAN: Rosita Fire, MD  Chief Complaint: Fall with left above-the-knee amputation pain.  HPI: Matthew Kline is a 57 y.o. male who presents with acute fall where he fell out of bed at skilled nursing.  Patient's wife states that patient has been unresponsive for 4 days and is starting to have more appropriate speech at this time.  Patient does complain of pain and spasm in his left above-knee amputation.  Patient exam pronation  Past Medical History:  Diagnosis Date  . Anemia, unspecified   . Anxiety   . Arthralgia 2010   polyarticular  . Arthritis    "back, knees" (01/10/2017)  . Cancer [facility])    "kidney area" (01/10/2017)  . CHF (congestive heart failure) ([place]) 07/25/2009   denies  . Chronic lower back pain   . Coronary artery disease   . Coughing    pt. reports that he has drainage from sinus infection  . Diabetic foot ulcer ([place])   . Diabetic neuropathy ([place])   . ESRD (end stage renal disease) on dialysis [facility])    started 12/2012; "MWF; [facility] "  (01/10/2017)  . GERD (gastroesophageal reflux disease)    hx "before I lost weight", no problem 9 years  . Hemodialysis access site with mature fistula ([place])   . Hemorrhoids, internal 10/2011   small  . High cholesterol   . History of blood transfusion    "related to the anemia"  . Hypertension   . Insomnia, unspecified   . 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 Long term (current) use of anticoagulants   . Myocardial infarction ([place]) 1995  . Orthostatic hypotension   . Osteomyelitis of foot, left, acute ([place])   . Other chronic postoperative pain   . Pneumonia    "probably twice" (01/10/2017)  . Polymyalgia rheumatica ([place])   . Renal insufficiency   . Sleep apnea    "lost weight; no more problem" (01/10/2017)  . Stroke ([place]) 01/10/06   denies residual on 05/09/2014  . Type II diabetes mellitus ([place]) dx'd 1995  . Unspecified hereditary and  idiopathic peripheral neuropathy    feet  . Unspecified osteomyelitis, site unspecified   . Unspecified vitamin D deficiency    Past Surgical History:  Procedure Laterality Date  . ABDOMINAL AORTOGRAM N/A 08/25/2016   Procedure: ABDOMINAL AORTOGRAM;  Surgeon: Wellington Hampshire, MD;  Location: [place] [facility];  Service: Cardiovascular;  Laterality: N/A;  . AMPUTATION  01/21/2012   Procedure: AMPUTATION RAY;  Surgeon: [name], MD;  Location: [place];  Service: Orthopedics;  Laterality: Left;  Left Foot 4th Ray Amputation  . AMPUTATION Left 05/04/2013   Procedure: AMPUTATION DIGIT;  Surgeon: [name], MD;  Location: [place];  Service: Orthopedics;  Laterality: Left;  Left Great Toe Amputation at [facility]  . AMPUTATION Left 01/14/2017   Procedure: AMPUTATION ABOVE LEFT KNEE;  Surgeon: [name], MD;  Location: [place];  Service: Orthopedics;  Laterality: Left;  . ANTERIOR CERVICAL DECOMP/DISCECTOMY FUSION  02/2011  . BACK SURGERY    . BASCILIC VEIN TRANSPOSITION Left 10/19/2012   Procedure: BASCILIC VEIN TRANSPOSITION;  Surgeon: Serafina Mitchell, MD;  Location: [facility];  Service: Vascular;  Laterality: Left;  . CARDIAC CATHETERIZATION     "before bypass"  . CORONARY ARTERY BYPASS GRAFT     x 5 with lima at [place] WITH ANTIBIOTIC SPACERS Left 08/07/2014  Procedure: Replace Left Total Knee Arthroplasty,  Place Antibiotic Spacer;  Surgeon: [name], MD;  Location: [place];  Service: Orthopedics;  Laterality: Left;  . I&D EXTREMITY Left 05/09/2014   Procedure: Irrigation and Debridement Left Knee and Closure of Total Knee Arthroplasty Incision;  Surgeon: [name], MD;  Location: [place];  Service: Orthopedics;  Laterality: Left;  . I&D KNEE WITH POLY EXCHANGE Left 05/31/2014   Procedure: IRRIGATION AND DEBRIDEMENT LEFT KNEE, PLACE ANTIBIOTIC BEADS,  POLY EXCHANGE;  Surgeon: [name], MD;  Location: [place];  Service: Orthopedics;  Laterality: Left;   . IRRIGATION AND DEBRIDEMENT KNEE Left 01/12/2017   Procedure: IRRIGATION AND DEBRIDEMENT LEFT KNEE;  Surgeon: [name], MD;  Location: [place];  Service: Orthopedics;  Laterality: Left;  . JOINT REPLACEMENT    . KNEE ARTHROSCOPY Left 08-25-2012  . LOWER EXTREMITY ANGIOGRAPHY Left 08/25/2016   Procedure: Lower Extremity Angiography;  Surgeon: Wellington Hampshire, MD;  Location: [place] [facility];  Service: Cardiovascular;  Laterality: Left;  . PERIPHERAL VASCULAR BALLOON ANGIOPLASTY Left 08/25/2016   Procedure: PERIPHERAL VASCULAR BALLOON ANGIOPLASTY;  Surgeon: Wellington Hampshire, MD;  Location: [place] [facility];  Service: Cardiovascular;  Laterality: Left;  lt peroneal and ant tibial arteries cutting balloon  . REFRACTIVE SURGERY Bilateral   . TOE AMPUTATION Bilateral    "I've lost 7 toes over the last 7 years" (05/09/2014)  . TOE SURGERY Left April 2015   Big toe removed on left foot.  . TONSILLECTOMY    . TOTAL KNEE ARTHROPLASTY Left 04/10/2014   Procedure: TOTAL KNEE ARTHROPLASTY;  Surgeon: [name], MD;  Location: [place];  Service: Orthopedics;  Laterality: Left;  . TOTAL KNEE REVISION Left 10/25/2014   Procedure: LEFT TOTAL KNEE REVISION;  Surgeon: [name], MD;  Location: [place];  Service: Orthopedics;  Laterality: Left;  . TOTAL KNEE REVISION Left 11/26/2015   Procedure: Removal Left Total Knee Arthroplasty, Hinged Total Knee Arthroplasty;  Surgeon: [name], MD;  Location: [place];  Service: Orthopedics;  Laterality: Left;  . UVULOPALATOPHARYNGOPLASTY, TONSILLECTOMY AND SEPTOPLASTY  ~ 1989  . WOUND DEBRIDEMENT Left 05/09/2014   Dehiscence Left Total Knee Arthroplasty Incision   Social History   Socioeconomic History  . Marital status: Married    Spouse name: None  . Number of children: None  . Years of education: None  . Highest education level: None  Social Needs  . Financial resource strain: None  . Food insecurity - worry: None  . Food insecurity - inability:  None  . Transportation needs - medical: None  . Transportation needs - non-medical: None  Occupational History  . None  Tobacco Use  . Smoking status: Former Smoker    Packs/day: 0.12    Years: 32.00    Pack years: 3.84    Types: Cigarettes    Last attempt to quit: 10/05/2016    Years since quitting: 0.3  . Smokeless tobacco: Never Used  Substance and Sexual Activity  . Alcohol use: No    Alcohol/week: 0.0 oz  . Drug use: No  . Sexual activity: Not Currently  Other Topics Concern  . None  Social History Narrative  . None   Family History  Problem Relation Age of Onset  . Hypertension Mother   . Cancer Mother 63       Ovarian  . Heart disease Maternal Aunt   . Stroke Maternal Grandfather    - negative except otherwise  stated in the family history section Allergies  Allergen Reactions  . Morphine And Related Other (See Comments)    hallucinations  . Tygacil [Tigecycline] Nausea And Vomiting and Other (See Comments)    Makes him feel crazy   Prior to Admission medications   Medication Sig Start Date End Date Taking? Authorizing Provider  amLODipine (NORVASC) 5 MG tablet Take 5 mg by mouth daily.    Yes [provider]  aspirin EC 81 MG EC tablet Take 2 tablets (162 mg total) by mouth daily. 01/20/17  Yes Patrecia Pour, Christean Grief, MD  atorvastatin (LIPITOR) 40 MG tablet Take 40 mg by mouth at bedtime.    Yes [provider]  baclofen (LIORESAL) 10 MG tablet Take 5 mg by mouth See admin instructions. Take 1/2 tablet (5 mg) by mouth daily at bedtime for muscle spasms for 6 days (order date 01/26/17)   Yes [provider]  carvedilol (COREG) 25 MG tablet Take 25 mg by mouth daily.    Yes [provider]  clopidogrel (PLAVIX) 75 MG tablet Take 1 tablet (75 mg total) by mouth daily. 08/25/16 08/25/17 Yes Wellington Hampshire, MD  gabapentin (NEURONTIN) 300 MG capsule TAKE ONE CAPSULE BY MOUTH 3 TIMES A DAY Patient taking differently: TAKE ONE CAPSULE  (300 MG) BY MOUTH DAILY AT BEDTIME 01/28/17  Yes Dondra Prader R, NP  montelukast (SINGULAIR) 10 MG tablet TAKE 1 TABLET (10 MG TOTAL) BY MOUTH AT BEDTIME. 09/01/15  Yes Gildardo Cranker, DO  oxyCODONE-acetaminophen (PERCOCET) 5-325 MG tablet Take 1 tablet by mouth every 4 (four) hours as needed. Patient taking differently: Take 1 tablet by mouth every 4 (four) hours as needed (pain).  01/09/17  Yes Pisciotta, Elmyra Ricks, PA-C  sevelamer carbonate (RENVELA) 800 MG tablet Take 800-3,200 mg by mouth See admin instructions. Take 4 tablets (3200 mg) by mouth three times daily with meals, take 1 tablet (800 mg) with snacks   Yes [provider]  temazepam (RESTORIL) 30 MG capsule Take 30 mg by mouth at bedtime.  11/10/15  Yes [provider]  Amino Acids-Protein Hydrolys (FEEDING SUPPLEMENT, PRO-STAT SUGAR FREE 64,) LIQD Take 30 mLs by mouth 2 (two) times daily. 01/19/17   Doreatha Lew, MD  Nutritional Supplements (FEEDING SUPPLEMENT, NEPRO CARB STEADY,) LIQD Take 237 mLs by mouth 2 (two) times daily between meals. 01/19/17   Doreatha Lew, MD   No results found. - pertinent xrays, CT, MRI studies were reviewed and independently interpreted  Positive ROS: All other systems have been reviewed and were otherwise negative with the exception of those mentioned in the HPI and as above.  Physical Exam: General: Alert, no acute distress Psychiatric: Patient is competent for consent with normal mood and affect Lymphatic: No axillary or cervical lymphadenopathy Cardiovascular: No pedal edema Respiratory: No cyanosis, no use of accessory musculature GI: No organomegaly, abdomen is soft and non-tender  Skin:  Patient's wound edges are well approximated there is no redness no cellulitis no drainage no signs of infection. Neurologic: Patient does not have protective sensation bilateral lower extremities.   MUSCULOSKELETAL:  Examination patient can move his hip well.  No skin abrasions or  breakdowns.  There are no skin ischemic changes.  Assessment: Assessment: Acute fall status post left above-knee amputation.  Plan: Plan: Patient married Dial soap cleansing dry dressing changes daily.  Continue with physical therapy for strengthening.  Plan for discharge back to skilled nursing.  Thank you for the consult and the opportunity to see  Mr. Matthew Kline, [place] [phone] 12:38 PM

## 2017-02-04 NOTE — Progress Notes (Signed)
PROGRESS NOTE    Matthew Kline  HAL:[id number] DOB: 06/20/1960 DOA: 01/29/2017 PCP: Sandi Mariscal, MD   Brief Narrative: 57 year old male with chronic osteomyelitis status post left AKA in the beginning of January, ESRD on hemodialysis, chronic anemia, presented from rehab center for a fall and change in mental status.  Patient was discharged from hospital on January 20, 2007 on Keflex by orthopedics.  The wound culture from the surgery going staph which is methicillin sensitive.  He was found to have UTI, started on ceftriaxone and admitted for further evaluation.  Assessment & Plan:   #Altered mental status:Likely acute metabolic encephalopathy of unknown etiology concerning for related with any pain medication/sedatives versus infection.  Urine culture negative, UTI ruled out.   -Repeat CT scan with no change or acute finding.  Patient has mental status waxing and waning.  Today he is alert awake and oriented to himself and hospital only.  He is able to eat with assistance.  He is not focus and not able to follow commands well.  He sometimes talking which is not comprehensible or out of context.  His workup including: -Ammonia level not elevated, lactate not elevated, chest x-ray with no pneumonia. -EEG with no seizure. -B12, TSH level normal.  I reviewed the above with neurologist Dr.Arora on 02/03/2017.  Recommended to start thiamine 500 mg IV daily for 3 days and then maintenance dose.  Plan to continue till tomorrow and then transition to oral maintenance dose.  Continue to monitor for now.  If his mental status remains stable or improved by tomorrow he will likely go to SNF with outpatient follow-up.    He was empirically treated with antibiotics.  Blood cultures negative.  Discontinue ceftriaxone today.  #ESRD on hemodialysis, pulmonary edema and vascular congestion likely chronic: Maintenance hemodialysis today.  Monitor BMP.  #Left leg amputation by Dr. Sharol Given: Concerned about some  discharge from the wound site requiring frequent dressing change as per nurse.  I consulted Dr.Duda  for evaluation on 1/24.  I contacted him today as well.  Continue dressing change and supportive care.  #Chronic jerking movement of extremities: The patient's wife reported that it runs in the family and patient has these jerking movement for a long time.  Unchanged.  # Hypertension: Blood pressure elevated and has fluctuation.  Resume Norvasc.  Hematology today.  Continue Coreg  #Anemia of chronic kidney disease: Monitor CBC.  IV iron and ESA during dialysis.  Hemoglobin acceptable.  #History of coronary artery disease: Continue aspirin, Coreg, Lipitor, Plavix   DVT prophylaxis: Heparin subcutaneous Code Status: Full code Family Communication: No family at bedside. Disposition Plan: Social worker evaluation for a skilled nursing home.  Likely transfer to SNF tomorrow    Consultants:   Nephrology  Procedures: None Antimicrobials: Discontinue vancomycin on 1/22 and discontinue ceftriaxone on 1/25.  Subjective: Seen and examined at bedside.  No change in mental status.  Was alert awake and oriented to himself and hospital today.  Denies any symptoms.  Difficult to understand some of the wording Objective: Vitals:   02/03/17 2103 02/04/17 0504 02/04/17 0934 02/04/17 1315  BP: 137/78 (!) 158/87 (!) 161/83 (!) 163/87  Pulse: 78 95 (!) 104 100  Resp: 20 19 18 18   Temp: 98.5 F (36.9 C) 99.1 F (37.3 C) 98.9 F (37.2 C) 98.8 F (37.1 C)  TempSrc: Oral Oral Oral Oral  SpO2: 98% 98% 97% 96%  Weight:    68.4 kg (150 lb 12.7 oz)    Intake/Output  Summary (Last 24 hours) at 02/04/2017 1428 Last data filed at 02/04/2017 0600 Gross per 24 hour  Intake 0 ml  Output 0 ml  Net 0 ml   Filed Weights   02/02/17 1118 02/02/17 2057 02/04/17 1315  Weight: 66.3 kg (146 lb 2.6 oz) 66.5 kg (146 lb 9.7 oz) 68.4 kg (150 lb 12.7 oz)    Examination:  General exam: Lying on bed, not in  distress Respiratory system: Clear bilateral, respiratory effort normal.   Cardiovascular system: Regular rate rhythm S1-S2 normal.  No pedal edema. Gastrointestinal system: Abdomen soft, nontender.  Bowel sounds positive Central nervous system: Alert awake and oriented to himself in hospital. Skin: No rashes, lesions or ulcers    Data Reviewed: I have personally reviewed following labs and imaging studies  CBC: Recent Labs  Lab 01/29/17 0900 01/30/17 0710 01/31/17 0755 02/01/17 0019 02/02/17 0700 02/04/17 0652  WBC 9.1 9.9 13.5* 12.5* 9.7 8.6  NEUTROABS 7.1  --   --   --   --   --   HGB 8.9* 9.9* 8.9* 9.9* 9.0* 9.2*  HCT 29.7* 32.2* 29.3* 32.0* 29.8* 30.4*  MCV 90.8 90.7 90.4 90.4 89.8 89.9  PLT 248 230 213 207 235 932   Basic Metabolic Panel: Recent Labs  Lab 01/30/17 0710 01/31/17 0755 02/01/17 0019 02/02/17 0700 02/04/17 0702  NA 138 137 140 134* 134*  K 5.4* 5.8* 4.7 4.8 4.0  CL 95* 95* 98* 94* 93*  CO2 26 22 23 24 26   GLUCOSE 78 129* 155* 108* 93  BUN 52* 65* 31* 52* 49*  CREATININE 8.55* 9.99* 5.72* 7.93* 5.67*  CALCIUM 9.6 9.3 9.7 9.3 9.6  PHOS  --  7.1*  --  5.7* 4.8*   GFR: Estimated Creatinine Clearance: 13.9 mL/min (A) (by C-G formula based on SCr of 5.67 mg/dL (H)). Liver Function Tests: Recent Labs  Lab 01/29/17 0900 01/31/17 0755 02/02/17 0700 02/04/17 0702  AST 28  --   --   --   ALT 8*  --   --   --   ALKPHOS 97  --   --   --   BILITOT 1.3*  --   --   --   PROT 7.6  --   --   --   ALBUMIN 2.7* 2.4* 2.4* 2.6*   No results for input(s): LIPASE, AMYLASE in the last 168 hours. Recent Labs  Lab 01/29/17 1726  AMMONIA 19   Coagulation Profile: No results for input(s): INR, PROTIME in the last 168 hours. Cardiac Enzymes: No results for input(s): CKTOTAL, CKMB, CKMBINDEX, TROPONINI in the last 168 hours. BNP (last 3 results) No results for input(s): PROBNP in the last 8760 hours. HbA1C: No results for input(s): HGBA1C in the last 72  hours. CBG: Recent Labs  Lab 02/01/17 0747 02/01/17 1132 02/01/17 2054 02/02/17 1023 02/03/17 2204  GLUCAP 143* 133* 117* 88 115*   Lipid Profile: No results for input(s): CHOL, HDL, LDLCALC, TRIG, CHOLHDL, LDLDIRECT in the last 72 hours. Thyroid Function Tests: No results for input(s): TSH, T4TOTAL, FREET4, T3FREE, THYROIDAB in the last 72 hours. Anemia Panel: No results for input(s): VITAMINB12, FOLATE, FERRITIN, TIBC, IRON, RETICCTPCT in the last 72 hours. Sepsis Labs: Recent Labs  Lab 01/29/17 2020  LATICACIDVEN 0.57    Recent Results (from the past 240 hour(s))  Urine culture     Status: None   Collection Time: 01/29/17 11:41 AM  Result Value Ref Range Status   Specimen Description URINE, CLEAN CATCH  Final   Special Requests NONE  Final   Culture NO GROWTH  Final   Report Status 01/30/2017 FINAL  Final  MRSA PCR Screening     Status: None   Collection Time: 01/30/17 12:06 AM  Result Value Ref Range Status   MRSA by PCR NEGATIVE NEGATIVE Final    Comment:        The GeneXpert MRSA Assay (FDA approved for NASAL specimens only), is one component of a comprehensive MRSA colonization surveillance program. It is not intended to diagnose MRSA infection nor to guide or monitor treatment for MRSA infections.   Culture, blood (routine x 2)     Status: None   Collection Time: 01/30/17 12:02 PM  Result Value Ref Range Status   Specimen Description BLOOD RIGHT ANTECUBITAL  Final   Special Requests   Final    BOTTLES DRAWN AEROBIC ONLY Blood Culture adequate volume   Culture NO GROWTH 5 DAYS  Final   Report Status 02/04/2017 FINAL  Final  Culture, blood (routine x 2)     Status: None   Collection Time: 01/30/17 12:09 PM  Result Value Ref Range Status   Specimen Description BLOOD LEFT HAND  Final   Special Requests IN PEDIATRIC BOTTLE Blood Culture adequate volume  Final   Culture NO GROWTH 5 DAYS  Final   Report Status 02/04/2017 FINAL  Final          Radiology Studies: No results found.      Scheduled Meds: . aspirin EC  162 mg Oral Daily  . atorvastatin  40 mg Oral q1800  . carvedilol  12.5 mg Oral BID WC  . clopidogrel  75 mg Oral q1800  . darbepoetin (ARANESP) injection - DIALYSIS  150 mcg Intravenous Q Fri-HD  . feeding supplement (NEPRO CARB STEADY)  237 mL Oral BID BM  . feeding supplement (PRO-STAT SUGAR FREE 64)  30 mL Oral BID  . ferric citrate  420 mg Oral TID WC  . multivitamin  1 tablet Oral QHS  . sodium chloride flush  3 mL Intravenous Q12H   Continuous Infusions: . sodium chloride    . ferric gluconate (FERRLECIT/NULECIT) IV    . thiamine injection       LOS: 5 days    Ryann Pauli Tanna Furry, MD [facility] Pager [phone]  If 7PM-7AM, please contact night-coverage [email or web address] Password [facility] 02/04/2017, 2:28 PM

## 2017-02-04 NOTE — Clinical Social Work Note (Signed)
Patient should be ready for discharge on Saturday, 1/26. CSW submitted and [facility] received authorization from [place] on 1/24. Weekend CSW will facilitate discharge.  Kiandria Clum Givens, MSW, LCSW Licensed Clinical Social Worker [place] [phone]

## 2017-02-04 NOTE — Progress Notes (Signed)
[facility] Progress Note   Dialysis Orders: MWF NW 4h 400/800 70kg 2/2 bath AVF Hep 4000 -Mircera 251mcg IV q 2 weeks (last given during [facility] admit) - Venofer 100mg  x 5 ordered (3 given) - Hectoral 26mcg IV q HD - BMM: Auryxia 1/meals   Assessment/Plan: 1.AMS?acute metabolic encephalopathy- etiology unknown -Continues to wax and wane. Better, but not quite at baseline. nop + cultures - on Vanc and rocephin; started on high dose thiamin x 3 doses then maintenance 2. ESRD-MWF HD today.  3. Anemiaof CKD-Hgb 9.2,follow. Aranesp 172mcg qwk - resume 1/25 4. Secondary hyperparathyroidism- Cor Ca 10.6, Hectorol stopped. P slightly elevated.  5.HTN/volume- net UF 688 Wed -volume status ok; BP up a little 6. Nutrition- Renal diet w/ fluid restriction.losing weight  - add renavite; has prostat ordered/ 7. Recent L AKA- 01/14/17 Dr. Sharol Given.  8. CAD - per primary 9. Chronic jerking of extremities - chronic, per primary- no documented neurontin/pain meds in the past 7 days; had one dose ativan 1/19   Myriam Jacobson, PA-C [facility] Beeper [phone] 02/04/2017,8:40 AM  LOS: 5 days   Pt seen, examined and agree w A/P as above.  Kelly Splinter MD [facility] pager [phone]   02/04/2017, 2:29 PM    Subjective:   Describing some pain - vague - unclear if he means phantom pain.  Talking about eating but hasn't touched anything  Objective Vitals:   02/03/17 1011 02/03/17 1630 02/03/17 2103 02/04/17 0504  BP: (!) 157/74 140/72 137/78 (!) 158/87  Pulse: 84 77 78 95  Resp: 20 20 20 19   Temp: 98.3 F (36.8 C) 98.7 F (37.1 C) 98.5 F (36.9 C) 99.1 F (37.3 C)  TempSrc: Oral Oral Oral Oral  SpO2: 99% 99% 98% 98%  Weight:       Physical Exam General: NAD -  Heart: RRR Lungs:no rales Abdomen: soft NT Extremities: RLE - no edema L AKA wrapped Dialysis Access: left upper AVF + bruit Neuro: alert, speech clear - no  myoclonus   Additional Objective Labs: Basic Metabolic Panel: Recent Labs  Lab 01/31/17 0755 02/01/17 0019 02/02/17 0700 02/04/17 0702  NA 137 140 134* 134*  K 5.8* 4.7 4.8 4.0  CL 95* 98* 94* 93*  CO2 22 23 24 26   GLUCOSE 129* 155* 108* 93  BUN 65* 31* 52* 49*  CREATININE 9.99* 5.72* 7.93* 5.67*  CALCIUM 9.3 9.7 9.3 9.6  PHOS 7.1*  --  5.7* 4.8*   Liver Function Tests: Recent Labs  Lab 01/29/17 0900 01/31/17 0755 02/02/17 0700 02/04/17 0702  AST 28  --   --   --   ALT 8*  --   --   --   ALKPHOS 97  --   --   --   BILITOT 1.3*  --   --   --   PROT 7.6  --   --   --   ALBUMIN 2.7* 2.4* 2.4* 2.6*   No results for input(s): LIPASE, AMYLASE in the last 168 hours. CBC: Recent Labs  Lab 01/29/17 0900 01/30/17 0710 01/31/17 0755 02/01/17 0019 02/02/17 0700 02/04/17 0652  WBC 9.1 9.9 13.5* 12.5* 9.7 8.6  NEUTROABS 7.1  --   --   --   --   --   HGB 8.9* 9.9* 8.9* 9.9* 9.0* 9.2*  HCT 29.7* 32.2* 29.3* 32.0* 29.8* 30.4*  MCV 90.8 90.7 90.4 90.4 89.8 89.9  PLT 248 230 213 207 235 220   Blood  Culture    Component Value Date/Time   SDES BLOOD LEFT HAND 01/30/2017 1209   SPECREQUEST IN PEDIATRIC BOTTLE Blood Culture adequate volume 01/30/2017 1209   CULT NO GROWTH 4 DAYS 01/30/2017 1209   REPTSTATUS PENDING 01/30/2017 1209    Cardiac Enzymes: No results for input(s): CKTOTAL, CKMB, CKMBINDEX, TROPONINI in the last 168 hours. CBG: Recent Labs  Lab 02/01/17 0747 02/01/17 1132 02/01/17 2054 02/02/17 1023 02/03/17 2204  GLUCAP 143* 133* 117* 88 115*   Iron Studies: No results for input(s): IRON, TIBC, TRANSFERRIN, FERRITIN in the last 72 hours. Lab Results  Component Value Date   INR 1.30 11/08/2016   INR 1.2 08/17/2016   INR 1.15 10/07/2014   Studies/Results: No results found. Medications: . sodium chloride    . cefTRIAXone (ROCEPHIN)  IV 1 g (02/03/17 1729)  . thiamine injection     . aspirin EC  162 mg Oral Daily  . atorvastatin  40 mg Oral  q1800  . carvedilol  12.5 mg Oral BID WC  . clopidogrel  75 mg Oral q1800  . darbepoetin (ARANESP) injection - DIALYSIS  150 mcg Intravenous Q Fri-HD  . feeding supplement (NEPRO CARB STEADY)  237 mL Oral BID BM  . feeding supplement (PRO-STAT SUGAR FREE 64)  30 mL Oral BID  . ferric citrate  420 mg Oral TID WC  . sodium chloride flush  3 mL Intravenous Q12H

## 2017-02-05 ENCOUNTER — Inpatient Hospital Stay (HOSPITAL_COMMUNITY): Payer: Medicare HMO

## 2017-02-05 MED ORDER — BISACODYL 10 MG RE SUPP
10.0000 mg | Freq: Every day | RECTAL | Status: DC | PRN
  Administered 2017-02-06: 10 mg via RECTAL
  Filled 2017-02-05 (×2): qty 1

## 2017-02-05 NOTE — Progress Notes (Signed)
[name], male   DOB: [birth year], 57 y.o.   MRN: [id number]  Nursing called, I am on call for Dr Sharol Given, concern that this patient may be having some bleeding from his left AKA incision. Dressing changed, dry blood apparently from this AM. No purulence,  No fluctuance or hematoma. Unable to express any drainage. Dry dressing with drain sponges in the room ABD and kerlix then ACE wrap with hypofix tape assisting the dressing of the left AKA residual  Limb. No active bleeding. May just be intermittant trauma locally when He moves or shifts his body on his bed. I will recheck in the AM Sunday.

## 2017-02-05 NOTE — Progress Notes (Signed)
PROGRESS NOTE    Matthew Kline  KGM:[id number] DOB: 01-Nov-1960 DOA: 01/29/2017 PCP: Sandi Mariscal, MD   Brief Narrative: 57 year old male with chronic osteomyelitis status post left AKA in the beginning of January, ESRD on hemodialysis, chronic anemia, presented from rehab center for a fall and change in mental status.  Matthew Kline was discharged from hospital on January 20, 2007 on Keflex by orthopedics.  The wound culture from the surgery going staph which is methicillin sensitive.  He was found to have UTI, started on ceftriaxone and admitted for further evaluation.  02/05/17 -Matthew Kline seen today.  Matthew Kline remains altered.  We will continue to workup Matthew Kline's altered mentation.  Assessment & Plan:   #Altered mental status:Likely acute metabolic encephalopathy of unknown etiology concerning for related with any pain medication/sedatives versus infection.  Urine culture negative, UTI ruled out.   -Repeat CT scan with no change or acute finding.  Matthew Kline has mental status waxing and waning.  Today he is alert awake and oriented to himself and hospital only.  He is able to eat with assistance.  He is not focus and not able to follow commands well.  He sometimes talking which is not comprehensible or out of context.  His workup including: -Ammonia level not elevated, lactate not elevated, chest x-ray with no pneumonia. -EEG with no seizure. -B12, TSH level normal. -IV thiamine.  #ESRD on hemodialysis, pulmonary edema and vascular congestion likely chronic: Maintenance hemodialysis today.  Monitor BMP.  #Left leg amputation by Dr. Sharol Given: Concerned about some discharge from the wound site requiring frequent dressing change as per nurse.  I consulted Dr.Duda  for evaluation on 1/24.  I contacted him today as well.  Continue dressing change and supportive care.  #Chronic jerking movement of extremities: The Matthew Kline's wife reported that it runs in the family and Matthew Kline has these jerking movement for a  long time.  Unchanged.  # Hypertension: Blood pressure elevated and has fluctuation.  Resume Norvasc.  Hematology today.  Continue Coreg  #Anemia of chronic kidney disease: Monitor CBC.  IV iron and ESA during dialysis.  Hemoglobin acceptable.  #History of coronary artery disease: Continue aspirin, Coreg, Lipitor, Plavix   DVT prophylaxis: Heparin subcutaneous Code Status: Full code Family Communication: No family at bedside. Disposition Plan: Social worker evaluation for a skilled nursing home.  Likely transfer to SNF tomorrow    Consultants:   Nephrology  Procedures: None Antimicrobials: Discontinue vancomycin on 1/22 and discontinue ceftriaxone on 1/25.  Subjective: No complaints. Matthew Kline remains altered.  Objective: Vitals:   02/04/17 1800 02/04/17 2223 02/05/17 0507 02/05/17 0956  BP: (!) 160/86 (!) 167/77 129/80 109/65  Pulse: 85 90 84 90  Resp: 18 16 16 18   Temp: 98.1 F (36.7 C) 98.1 F (36.7 C) 97.7 F (36.5 C) 98.4 F (36.9 C)  TempSrc:  Oral Oral Oral  SpO2: 98% 98% 100% 100%  Weight: 66.5 kg (146 lb 9.7 oz)       Intake/Output Summary (Last 24 hours) at 02/05/2017 1608 Last data filed at 02/05/2017 0643 Gross per 24 hour  Intake 0 ml  Output 1040 ml  Net -1040 ml   Filed Weights   02/02/17 2057 02/04/17 1315 02/04/17 1800  Weight: 66.5 kg (146 lb 9.7 oz) 68.4 kg (150 lb 12.7 oz) 66.5 kg (146 lb 9.7 oz)    Examination:  General exam: Lying on bed, not in distress Respiratory system: Clear bilateral, respiratory effort normal.   Cardiovascular system: Regular rate rhythm S1-S2 normal.  No  pedal edema. Gastrointestinal system: Abdomen soft, nontender.  Bowel sounds positive Central nervous system: Awake. pinpoint pupil  Data Reviewed: I have personally reviewed following labs and imaging studies  CBC: Recent Labs  Lab 01/30/17 0710 01/31/17 0755 02/01/17 0019 02/02/17 0700 02/04/17 0652  WBC 9.9 13.5* 12.5* 9.7 8.6  HGB 9.9* 8.9* 9.9*  9.0* 9.2*  HCT 32.2* 29.3* 32.0* 29.8* 30.4*  MCV 90.7 90.4 90.4 89.8 89.9  PLT 230 213 207 235 814   Basic Metabolic Panel: Recent Labs  Lab 01/30/17 0710 01/31/17 0755 02/01/17 0019 02/02/17 0700 02/04/17 0702  NA 138 137 140 134* 134*  K 5.4* 5.8* 4.7 4.8 4.0  CL 95* 95* 98* 94* 93*  CO2 26 22 23 24 26   GLUCOSE 78 129* 155* 108* 93  BUN 52* 65* 31* 52* 49*  CREATININE 8.55* 9.99* 5.72* 7.93* 5.67*  CALCIUM 9.6 9.3 9.7 9.3 9.6  PHOS  --  7.1*  --  5.7* 4.8*   GFR: Estimated Creatinine Clearance: 13.5 mL/min (A) (by C-G formula based on SCr of 5.67 mg/dL (H)). Liver Function Tests: Recent Labs  Lab 01/31/17 0755 02/02/17 0700 02/04/17 0702  ALBUMIN 2.4* 2.4* 2.6*   No results for input(s): LIPASE, AMYLASE in the last 168 hours. Recent Labs  Lab 01/29/17 1726  AMMONIA 19   Coagulation Profile: No results for input(s): INR, PROTIME in the last 168 hours. Cardiac Enzymes: No results for input(s): CKTOTAL, CKMB, CKMBINDEX, TROPONINI in the last 168 hours. BNP (last 3 results) No results for input(s): PROBNP in the last 8760 hours. HbA1C: No results for input(s): HGBA1C in the last 72 hours. CBG: Recent Labs  Lab 02/01/17 1132 02/01/17 2054 02/02/17 1023 02/03/17 2204 02/04/17 1853  GLUCAP 133* 117* 88 115* 104*   Lipid Profile: No results for input(s): CHOL, HDL, LDLCALC, TRIG, CHOLHDL, LDLDIRECT in the last 72 hours. Thyroid Function Tests: No results for input(s): TSH, T4TOTAL, FREET4, T3FREE, THYROIDAB in the last 72 hours. Anemia Panel: No results for input(s): VITAMINB12, FOLATE, FERRITIN, TIBC, IRON, RETICCTPCT in the last 72 hours. Sepsis Labs: Recent Labs  Lab 01/29/17 2020  LATICACIDVEN 0.57    Recent Results (from the past 240 hour(s))  Urine culture     Status: None   Collection Time: 01/29/17 11:41 AM  Result Value Ref Range Status   Specimen Description URINE, CLEAN CATCH  Final   Special Requests NONE  Final   Culture NO GROWTH   Final   Report Status 01/30/2017 FINAL  Final  MRSA PCR Screening     Status: None   Collection Time: 01/30/17 12:06 AM  Result Value Ref Range Status   MRSA by PCR NEGATIVE NEGATIVE Final    Comment:        The GeneXpert MRSA Assay (FDA approved for NASAL specimens only), is one component of a comprehensive MRSA colonization surveillance program. It is not intended to diagnose MRSA infection nor to guide or monitor treatment for MRSA infections.   Culture, blood (routine x 2)     Status: None   Collection Time: 01/30/17 12:02 PM  Result Value Ref Range Status   Specimen Description BLOOD RIGHT ANTECUBITAL  Final   Special Requests   Final    BOTTLES DRAWN AEROBIC ONLY Blood Culture adequate volume   Culture NO GROWTH 5 DAYS  Final   Report Status 02/04/2017 FINAL  Final  Culture, blood (routine x 2)     Status: None   Collection Time: 01/30/17 12:09 PM  Result Value Ref Range Status   Specimen Description BLOOD LEFT HAND  Final   Special Requests IN PEDIATRIC BOTTLE Blood Culture adequate volume  Final   Culture NO GROWTH 5 DAYS  Final   Report Status 02/04/2017 FINAL  Final         Radiology Studies: No results found.      Scheduled Meds: . aspirin EC  162 mg Oral Daily  . atorvastatin  40 mg Oral q1800  . carvedilol  12.5 mg Oral BID WC  . clopidogrel  75 mg Oral q1800  . darbepoetin (ARANESP) injection - DIALYSIS  150 mcg Intravenous Q Fri-HD  . feeding supplement (NEPRO CARB STEADY)  237 mL Oral BID BM  . feeding supplement (PRO-STAT SUGAR FREE 64)  30 mL Oral BID  . ferric citrate  420 mg Oral TID WC  . multivitamin  1 tablet Oral QHS  . sodium chloride flush  3 mL Intravenous Q12H   Continuous Infusions: . sodium chloride    . ferric gluconate (FERRLECIT/NULECIT) IV 125 mg (02/04/17 1454)     LOS: 6 days    Bonnell Public, MD [facility] Pager [phone]  If 7PM-7AM, please contact night-coverage [email or web address] Password  [facility] 02/05/2017, 4:08 PM

## 2017-02-05 NOTE — Progress Notes (Signed)
Patient is hallucinating "black things flying in the room" "Fine hair in my bed" "pea soup in the bed" and continues to believe he is covered in stool when he is clean and dry.

## 2017-02-05 NOTE — Progress Notes (Signed)
[facility] Progress Note   Dialysis Orders: MWF NW 4h 400/800 70kg 2/2 bath AVF Hep 4000 -Mircera 286mcg IV q 2 weeks (last given during [facility] admit) - Venofer 100mg  x 5 ordered (3 given) - Hectoral 29mcg IV q HD - BMM: Auryxia 1/meals  Assessment/Plan: 1.AMS?acute metabolic encephalopathy-etiology unknown -Continues to wax and wane. Better per nursing today but still seeing people/children that are not thre;no + cultures - on Vanc and rocephin; started on high dose thiamine x 3 doses then maintenance 2. ESRD-MWF HD - next HD Monday 3. Anemiaof CKD-Hgb 9.2,follow.Aranesp 168mcg qwk - resume 1/25 4. Secondary hyperparathyroidism- Cor Ca ^, Hectorol stopped. P better  5.HTN/volume- net UF 688 Wed, 1040 Friday -volume status ok; BP up last night - needs more volume down post wt 66,5- titrate gently 6. Nutrition- Renal diet w/ fluid restriction.losing weight  - add renavite; has prostat ordered/ 7.Recent LAKA- 01/14/17 Dr. Sharol Given.- rewrapped by nursing this am - noted some edema /scant bleeding  8. CAD- per primary 9. Chronic jerking of extremities - chronic, per primary- no documented neurontin/pain meds in the past 7 days; had one dose ativan 1/19  Myriam Jacobson, PA-C [facility] Beeper [phone] 02/05/2017,9:21 AM  LOS: 6 days   Pt seen, examined and agree w A/P as above.  Kelly Splinter MD [employer] pager [phone]   02/05/2017, 1:19 PM    Subjective:   Ate most of breakfast except eggs  Objective Vitals:   02/04/17 1738 02/04/17 1800 02/04/17 2223 02/05/17 0507  BP: (!) 160/82 (!) 160/86 (!) 167/77 129/80  Pulse: 85 85 90 84  Resp: 18 18 16 16   Temp:  98.1 F (36.7 C) 98.1 F (36.7 C) 97.7 F (36.5 C)  TempSrc:   Oral Oral  SpO2:  98% 98% 100%  Weight:  66.5 kg (146 lb 9.7 oz)     Physical Exam General: NADalert  rambling intermittently not making sense - oriented to place, Trump - not  year Heart: RRR Lungs: no rales Abdomen: soft NT Extremities: left AKA - wrapped right LE no edema Dialysis Access: left upper AVF + bruit   Additional Objective Labs: Basic Metabolic Panel: Recent Labs  Lab 01/31/17 0755 02/01/17 0019 02/02/17 0700 02/04/17 0702  NA 137 140 134* 134*  K 5.8* 4.7 4.8 4.0  CL 95* 98* 94* 93*  CO2 22 23 24 26   GLUCOSE 129* 155* 108* 93  BUN 65* 31* 52* 49*  CREATININE 9.99* 5.72* 7.93* 5.67*  CALCIUM 9.3 9.7 9.3 9.6  PHOS 7.1*  --  5.7* 4.8*   Liver Function Tests: Recent Labs  Lab 01/31/17 0755 02/02/17 0700 02/04/17 0702  ALBUMIN 2.4* 2.4* 2.6*   No results for input(s): LIPASE, AMYLASE in the last 168 hours. CBC: Recent Labs  Lab 01/30/17 0710 01/31/17 0755 02/01/17 0019 02/02/17 0700 02/04/17 0652  WBC 9.9 13.5* 12.5* 9.7 8.6  HGB 9.9* 8.9* 9.9* 9.0* 9.2*  HCT 32.2* 29.3* 32.0* 29.8* 30.4*  MCV 90.7 90.4 90.4 89.8 89.9  PLT 230 213 207 235 220   Blood Culture    Component Value Date/Time   SDES BLOOD LEFT HAND 01/30/2017 1209   SPECREQUEST IN PEDIATRIC BOTTLE Blood Culture adequate volume 01/30/2017 1209   CULT NO GROWTH 5 DAYS 01/30/2017 1209   REPTSTATUS 02/04/2017 FINAL 01/30/2017 1209    Cardiac Enzymes: No results for input(s): CKTOTAL, CKMB, CKMBINDEX, TROPONINI in the last 168 hours. CBG: Recent Labs  Lab 02/01/17 1132 02/01/17 2054  02/02/17 1023 02/03/17 2204 02/04/17 1853  GLUCAP 133* 117* 88 115* 104*   Iron Studies: No results for input(s): IRON, TIBC, TRANSFERRIN, FERRITIN in the last 72 hours. Lab Results  Component Value Date   INR 1.30 11/08/2016   INR 1.2 08/17/2016   INR 1.15 10/07/2014   Studies/Results: No results found. Medications: . sodium chloride    . ferric gluconate (FERRLECIT/NULECIT) IV 125 mg (02/04/17 1454)  . thiamine injection     . aspirin EC  162 mg Oral Daily  . atorvastatin  40 mg Oral q1800  . carvedilol  12.5 mg Oral BID WC  . clopidogrel  75 mg Oral q1800   . darbepoetin (ARANESP) injection - DIALYSIS  150 mcg Intravenous Q Fri-HD  . feeding supplement (NEPRO CARB STEADY)  237 mL Oral BID BM  . feeding supplement (PRO-STAT SUGAR FREE 64)  30 mL Oral BID  . ferric citrate  420 mg Oral TID WC  . multivitamin  1 tablet Oral QHS  . sodium chloride flush  3 mL Intravenous Q12H

## 2017-02-06 ENCOUNTER — Inpatient Hospital Stay (HOSPITAL_COMMUNITY): Payer: Medicare HMO

## 2017-02-06 DIAGNOSIS — I6330 Cerebral infarction due to thrombosis of unspecified cerebral artery: Secondary | ICD-10-CM

## 2017-02-06 DIAGNOSIS — I6319 Cerebral infarction due to embolism of other precerebral artery: Secondary | ICD-10-CM

## 2017-02-06 DIAGNOSIS — I6340 Cerebral infarction due to embolism of unspecified cerebral artery: Secondary | ICD-10-CM

## 2017-02-06 MED ORDER — ATORVASTATIN CALCIUM 80 MG PO TABS
80.0000 mg | ORAL_TABLET | Freq: Every day | ORAL | Status: DC
  Administered 2017-02-06 – 2017-02-07 (×2): 80 mg via ORAL
  Filled 2017-02-06 (×2): qty 1

## 2017-02-06 MED ORDER — STROKE: EARLY STAGES OF RECOVERY BOOK
Freq: Once | Status: DC
  Filled 2017-02-06: qty 1

## 2017-02-06 NOTE — Consult Note (Addendum)
Neurology Consultation  Reason for Consult: Stroke Referring Physician: Dr Marthenia Rolling  CC: Confusion  History is obtained from: Patient, chart  HPI: Matthew Kline is a 57 y.o. male who has a past medical history of chronic low back pain, coronary artery disease, diabetes with complications, ESRD on dialysis, gastroesophageal reflux disease, hypertension, hyperlipidemia, stroke in the past that left him with some speech problems that have since completely resolved, and a recent left AKA due to chronic osteomyelitis, brought into the hospital on 1/19 from a rehab center for confusion. History of alcohol abuse and lab evaluation showing UTI led to high-dose thiamine treatment and treatment of the UTI.  An MRI of the brain was done for some reason today, presumably as a part of completing the workup for confusion which showed a small acute left hippocampal infarct. Patient was unable to tell me if the cause was found for his prior stroke. He recently quit smoking and had been a heavy smoker. He also was morbidly obese and lost weight without surgery. He continues to be altered off and on per chart review. He has been complaining of memory problems ongoing for the past few months.  LKW: Many days ago tpa given?: no, outside the window Premorbid modified Rankin scale (mRS):3  ROS: Review of systems performed.  Negative except noted in the HPI.  Past Medical History:  Diagnosis Date  . Anemia, unspecified   . Anxiety   . Arthralgia 2010   polyarticular  . Arthritis    "back, knees" (01/10/2017)  . Cancer [facility])    "kidney area" (01/10/2017)  . CHF (congestive heart failure) ([place]) 07/25/2009   denies  . Chronic lower back pain   . Coronary artery disease   . Coughing    pt. reports that he has drainage from sinus infection  . Diabetic foot ulcer ([place])   . Diabetic neuropathy ([place])   . ESRD (end stage renal disease) on dialysis [facility])    started 12/2012; "MWF; [facility] "   (01/10/2017)  . GERD (gastroesophageal reflux disease)    hx "before I lost weight", no problem 9 years  . Hemodialysis access site with mature fistula ([place])   . Hemorrhoids, internal 10/2011   small  . High cholesterol   . History of blood transfusion    "related to the anemia"  . Hypertension   . Insomnia, unspecified   . 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 Long term (current) use of anticoagulants   . Myocardial infarction ([place]) 1995  . Orthostatic hypotension   . Osteomyelitis of foot, left, acute ([place])   . Other chronic postoperative pain   . Pneumonia    "probably twice" (01/10/2017)  . Polymyalgia rheumatica ([place])   . Renal insufficiency   . Sleep apnea    "lost weight; no more problem" (01/10/2017)  . Stroke ([place]) 01/10/06   denies residual on 05/09/2014  . Type II diabetes mellitus ([place]) dx'd 1995  . Unspecified hereditary and idiopathic peripheral neuropathy    feet  . Unspecified osteomyelitis, site unspecified   . Unspecified vitamin D deficiency    Family History  Problem Relation Age of Onset  . Hypertension Mother   . Cancer Mother 21       Ovarian  . Heart disease Maternal Aunt   . Stroke Maternal Grandfather     Social History:   reports that he quit smoking about 4 months ago. His smoking use included cigarettes. He has a 3.84 pack-year smoking  history. he has never used smokeless tobacco. He reports that he does not drink alcohol or use drugs.  Medications  Current Facility-Administered Medications:  .  0.9 %  sodium chloride infusion, 250 mL, Intravenous, PRN, Derrill Kay A, MD .  acetaminophen (TYLENOL) tablet 650 mg, 650 mg, Oral, Q6H PRN, Rosita Fire, MD, 650 mg at 02/05/17 1135 .  aspirin EC tablet 162 mg, 162 mg, Oral, Daily, Derrill Kay A, MD, 162 mg at 02/06/17 1006 .  atorvastatin (LIPITOR) tablet 40 mg, 40 mg, Oral, q1800, Phillips Grout, MD, 40 mg at 02/05/17 1755 .  bisacodyl (DULCOLAX) suppository 10 mg, 10 mg,  Rectal, Daily PRN, Dana Allan I, MD .  carvedilol (COREG) tablet 12.5 mg, 12.5 mg, Oral, BID WC, Deterding, James, MD, 12.5 mg at 02/06/17 1006 .  clopidogrel (PLAVIX) tablet 75 mg, 75 mg, Oral, q1800, Phillips Grout, MD, 75 mg at 02/05/17 1755 .  Darbepoetin Alfa (ARANESP) injection 150 mcg, 150 mcg, Intravenous, Q Fri-HD, Penninger, Lindsay, PA, 150 mcg at 02/04/17 1447 .  feeding supplement (NEPRO CARB STEADY) liquid 237 mL, 237 mL, Oral, BID BM, Derrill Kay A, MD, 237 mL at 02/06/17 1006 .  feeding supplement (PRO-STAT SUGAR FREE 64) liquid 30 mL, 30 mL, Oral, BID, Derrill Kay A, MD, 30 mL at 02/06/17 1006 .  ferric citrate (AURYXIA) tablet 420 mg, 420 mg, Oral, TID WC, Loren Racer, PA-C, 420 mg at 02/05/17 1755 .  ferric gluconate (NULECIT) 125 mg in sodium chloride 0.9 % 100 mL IVPB, 125 mg, Intravenous, Q M,W,F-HD, Roney Jaffe, MD, Last Rate: 110 mL/hr at 02/04/17 1454, 125 mg at 02/04/17 1454 .  hydrALAZINE (APRESOLINE) injection 10 mg, 10 mg, Intravenous, Q6H PRN, Rosita Fire, MD, 10 mg at 01/31/17 2154 .  multivitamin (RENA-VIT) tablet 1 tablet, 1 tablet, Oral, QHS, Alric Seton, PA-C, 1 tablet at 02/05/17 2122 .  ondansetron (ZOFRAN) tablet 4 mg, 4 mg, Oral, Q6H PRN **OR** ondansetron (ZOFRAN) injection 4 mg, 4 mg, Intravenous, Q6H PRN, Derrill Kay A, MD .  sodium chloride flush (NS) 0.9 % injection 3 mL, 3 mL, Intravenous, Q12H, Derrill Kay A, MD, 3 mL at 02/05/17 2125 .  sodium chloride flush (NS) 0.9 % injection 3 mL, 3 mL, Intravenous, PRN, Phillips Grout, MD   Exam: Current vital signs: BP (!) 166/89 (BP Location: Right Arm)   Pulse (!) 104   Temp 98.4 F (36.9 C) (Oral)   Resp 18   Wt 67.5 kg (148 lb 13 oz)   SpO2 98%   BMI 20.75 kg/m  Vital signs in last 24 hours: Temp:  [98.4 F (36.9 C)-98.7 F (37.1 C)] 98.4 F (36.9 C) (01/27 0539) Pulse Rate:  [92-104] 104 (01/27 0539) Resp:  [18] 18 (01/27 0539) BP: (151-166)/(86-89)  166/89 (01/27 0539) SpO2:  [98 %] 98 % (01/27 0539) Weight:  [67.5 kg (148 lb 13 oz)] 67.5 kg (148 lb 13 oz) (01/26 2139)  GENERAL: Awake, alert in NAD HEENT: - Normocephalic and atraumatic, dry mm, no LN++, no Thyromegally LUNGS - Clear to auscultation bilaterally with no wheezes CV - S1S2 RRR, no m/r/g, equal pulses bilaterally. ABDOMEN - Soft, nontender, nondistended with normoactive BS Ext: warm, well perfused, intact peripheral pulses, __ edema  NEURO:  Mental Status: Alert and oriented x2, got the month wrong  Language: speech is non-dysarthric.  Naming, repetition, fluency, and comprehension intact. Cranial Nerves: PERRL. EOMI, visual fields full, no facial asymmetry, facial sensation intact, hearing intact,  tongue/uvula/soft palate midline, normal sternocleidomastoid and trapezius muscle strength. No evidence of tongue atrophy or fibrillations Motor: 5/5 in both upper extremities.  5/5 in the right lower extremity.  5/5 in the left hip.  Left AKA.  Mild asterixis on outstretched arms. Tone: is normal and bulk is normal Sensation- Intact to light touch bilaterally Coordination: FTN intact bilaterally Gait- deferred  NIHSS 1a Level of Conscious.: 0 1b LOC Questions: 1 1c LOC Commands: 0 2 Best Gaze: 0 3 Visual: 0 4 Facial Palsy: 0 5a Motor Arm - left: 0 5b Motor Arm - Right: 0 6a Motor Leg - Left:  UN 6b Motor Leg - Right: 0 7 Limb Ataxia: 0 8 Sensory: 0 9 Best Language: 0 10 Dysarthria: 0 11 Extinct. and Inatten.: 0 TOTAL: 1  Labs I have reviewed labs in epic and the results pertinent to this consultation are: Hemoglobin 10.4, hematocrit 30.4, sodium 134, BUN 49, creatinine 5.67, albumin 2.6, potassium 4.0 CBC    Component Value Date/Time   WBC 8.6 02/04/2017 0652   RBC 3.38 (L) 02/04/2017 0652   HGB 9.2 (L) 02/04/2017 0652   HGB 10.4 (L) 08/17/2016 1203   HCT 30.4 (L) 02/04/2017 0652   HCT 35.1 (L) 08/17/2016 1203   PLT 220 02/04/2017 0652   PLT 181  08/17/2016 1203   MCV 89.9 02/04/2017 0652   MCV 89 08/17/2016 1203   MCH 27.2 02/04/2017 0652   MCHC 30.3 02/04/2017 0652   RDW 17.0 (H) 02/04/2017 0652   RDW 18.7 (H) 08/17/2016 1203   LYMPHSABS 1.2 01/29/2017 0900   LYMPHSABS 3.5 (H) 12/13/2012 1219   MONOABS 0.7 01/29/2017 0900   EOSABS 0.1 01/29/2017 0900   EOSABS 0.3 12/13/2012 1219   BASOSABS 0.0 01/29/2017 0900   BASOSABS 0.1 12/13/2012 1219    CMP     Component Value Date/Time   NA 134 (L) 02/04/2017 0702   NA 141 08/17/2016 1203   K 4.0 02/04/2017 0702   CL 93 (L) 02/04/2017 0702   CO2 26 02/04/2017 0702   GLUCOSE 93 02/04/2017 0702   BUN 49 (H) 02/04/2017 0702   BUN 33 (H) 08/17/2016 1203   CREATININE 5.67 (H) 02/04/2017 0702   CALCIUM 9.6 02/04/2017 0702   PROT 7.6 01/29/2017 0900   PROT 7.1 06/25/2013 1007   ALBUMIN 2.6 (L) 02/04/2017 0702   ALBUMIN 4.0 06/25/2013 1007   AST 28 01/29/2017 0900   ALT 8 (L) 01/29/2017 0900   ALKPHOS 97 01/29/2017 0900   BILITOT 1.3 (H) 01/29/2017 0900   GFRNONAA 10 (L) 02/04/2017 0702   GFRAA 12 (L) 02/04/2017 0702    Lipid Panel     Component Value Date/Time   CHOL 180 07/06/2015 0124   CHOL 228 (H) 09/15/2012 1030   TRIG 118 07/06/2015 0124   HDL 44 07/06/2015 0124   HDL 40 09/15/2012 1030   CHOLHDL 4.1 07/06/2015 0124   VLDL 24 07/06/2015 0124   LDLCALC 112 (H) 07/06/2015 0124   LDLCALC 113 (H) 09/15/2012 1030   Imaging I have reviewed the images obtained: CT-scan of the brain showed no acute changes.  MRI examination of the brain -generalized atrophy, acute left hippocampal stroke.  Prior left MCA territory infarct.  Assessment:  57 year old man with a extensive cerebrovascular risk factor history including that of a stroke with no residual deficits, presenting for evaluation of altered mental status. MRI done reveals a left hippocampal stroke. Given his extensive history of ESRD, diabetes, coronary artery disease and hypertension, this  could be small  vessel disease but is an atypical location for it. He needs stroke workup including blood vessel imaging, echo cardiogram and labs. Recommendations as below. Also consider doing an EEG since the acute stroke is in the hippocampus and there has been complained of off and on altered mentation.  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 Evaluate for seizure Toxic metabolic encephalopathy secondary to UTI-resolved  Recommendations: -Telemetry monitoring -Usually would recommend permissive hypertension but since the stroke seems to have happened probably at the time of presentation, no need for permissive hypertension. -MRA of the head and neck without contrast given he is ESRD patient. -Echocardiogram -HgbA1c, fasting lipid panel -Frequent neuro checks -Prophylactic therapy-aspirin and Plavix for now. If deemed to be small vessel stroke, consider changing antiplatelets. -Atorvastatin 80 mg PO daily -Risk factor modification -I discussed the importance of exercise as well as smoking/alcohol/illicit drug use cessation. -PT consult, OT consult, Speech consult -EEG -He has enough risk factors that can cause stroke.  I do not feel there is a need for ordering the stroke in young panel for hypercoagulable workup.  I will though defer that decision to the stroke team.  Please page stroke NP/PA/MD (listed on AMION)  from 8am-4 pm as this patient will be followed by the stroke team at this point.  -- Amie Portland, MD [facility] Pager: [phone] If 7pm to 7am, please call on call as listed on AMION.

## 2017-02-06 NOTE — Progress Notes (Signed)
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 ESRD-MWF HD - next HD Monday start on 3 K bath 3. Anemiaof CKD-Hgb 9.2,follow.Aranesp 123mcg qwk- resume 1/25 4. Secondary hyperparathyroidism- Cor Ca ^, Hectorol stopped. P better  5.HTN/volume- net UF 688 Wed, 1040 Friday -volume status ok; BP up last night - needs more volume down post wt 66,5- titrate gently 6. Nutrition- Renal diet w/ fluid restriction.losing weight - add renavite; has prostat ordered/ 7.Recent LAKA- 01/14/17 Dr. Sharol Given - noted some edema Saturday while unwrapped 8. CAD- per primary 9. Chronic jerking of extremities - chronic/ intermittent, per primary- no documented neurontin/pain meds in the past 7 days; had one dose ativan 1/19 10.  Failure to thrive - progressive decline in health over past 2-3 years. Consider pall care input.     Myriam Jacobson, PA-C [facility] 02/06/2017,10:09 AM  LOS: 7 days   Pt seen, examined, agree w assess/plan as above with additions as indicated.  Kelly Splinter MD [place] pager [phone]    cell [phone] 02/06/2017, 1:44 PM     Subjective:   Trying to tell me a story that I shouldn't tell anyone, but can't find the words.  Wife in room - to feed him breakfast  Objective Vitals:   02/05/17 0507 02/05/17 0956 02/05/17 2139 02/06/17 0539  BP: 129/80 109/65 (!) 151/86 (!) 166/89  Pulse: 84 90 92 (!) 104  Resp: 16 18 18 18   Temp: 97.7 F (36.5  C) 98.4 F (36.9 C) 98.7 F (37.1 C) 98.4 F (36.9 C)  TempSrc: Oral Oral Oral Oral  SpO2: 100% 100% 98% 98%  Weight:   67.5 kg (148 lb 13 oz)    Physical Exam General: thin alert, not making sense Heart: RRR Lungs: no rales Abdomen: + BS soft NT Extremities: left AKA wrapped, right LE no edema Dialysis Access:  Left upper AVF + bruit   Additional Objective Labs: Basic Metabolic Panel: Recent Labs  Lab 01/31/17 0755 02/01/17 0019 02/02/17 0700 02/04/17 0702  NA 137 140 134* 134*  K 5.8* 4.7 4.8 4.0  CL 95* 98* 94* 93*  CO2 22 23 24 26   GLUCOSE 129* 155* 108* 93  BUN 65* 31* 52* 49*  CREATININE 9.99* 5.72* 7.93* 5.67*  CALCIUM 9.3 9.7 9.3 9.6  PHOS 7.1*  --  5.7* 4.8*   Liver Function Tests: Recent Labs  Lab 01/31/17 0755 02/02/17 0700 02/04/17 0702  ALBUMIN 2.4* 2.4* 2.6*   No results for input(s): LIPASE, AMYLASE in the last 168 hours. CBC: Recent Labs  Lab 01/31/17 0755 02/01/17 0019 02/02/17 0700 02/04/17 0652  WBC 13.5* 12.5* 9.7 8.6  HGB 8.9* 9.9* 9.0* 9.2*  HCT 29.3* 32.0* 29.8* 30.4*  MCV 90.4 90.4 89.8 89.9  PLT 213 207 235 220   Blood Culture    Component Value Date/Time   SDES BLOOD LEFT HAND 01/30/2017 1209   SPECREQUEST IN PEDIATRIC BOTTLE Blood Culture adequate volume 01/30/2017 1209   CULT NO GROWTH 5 DAYS 01/30/2017 1209  REPTSTATUS 02/04/2017 FINAL 01/30/2017 1209    Cardiac Enzymes: No results for input(s): CKTOTAL, CKMB, CKMBINDEX, TROPONINI in the last 168 hours. CBG: Recent Labs  Lab 02/01/17 1132 02/01/17 2054 02/02/17 1023 02/03/17 2204 02/04/17 1853  GLUCAP 133* 117* 88 115* 104*   Iron Studies: No results for input(s): IRON, TIBC, TRANSFERRIN, FERRITIN in the last 72 hours. Lab Results  Component Value Date   INR 1.30 11/08/2016   INR 1.2 08/17/2016   INR 1.15 10/07/2014   Studies/Results: Mr Brain Wo Contrast  Result Date: 02/06/2017 CLINICAL DATA:  [place] out of bed at nursing facility. Unresponsive for  the last 4 days. EXAM: MRI HEAD WITHOUT CONTRAST TECHNIQUE: Multiplanar, multiecho pulse sequences of the brain and surrounding structures were obtained without intravenous contrast. COMPARISON:  CT 01/30/2017 FINDINGS: Brain: Subcentimeter acute infarction in the left hippocampus. No other acute infarction. The brainstem and cerebellum are normal. Cerebral hemispheres elsewhere show few old infarctions in the deep white matter an old left parieto-occipital cortical infarction. No mass lesion, hemorrhage, hydrocephalus or extra-axial collection. Vascular: Major vessels at the base of the brain show flow. 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 Scattered old white matter infarctions. 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 Electronically Signed   By: Nelson Chimes M.D.   On: 02/06/2017 09:50   Medications: . sodium chloride    . ferric gluconate (FERRLECIT/NULECIT) IV 125 mg (02/04/17 1454)   . aspirin EC  162 mg Oral Daily  . atorvastatin  40 mg Oral q1800  . carvedilol  12.5 mg Oral BID WC  . clopidogrel  75 mg Oral q1800  . darbepoetin (ARANESP) injection - DIALYSIS  150 mcg Intravenous Q Fri-HD  . feeding supplement (NEPRO CARB STEADY)  237 mL Oral BID BM  . feeding supplement (PRO-STAT SUGAR FREE 64)  30 mL Oral BID  . ferric citrate  420 mg Oral TID WC  . multivitamin  1 tablet Oral QHS  . sodium chloride flush  3 mL Intravenous Q12H

## 2017-02-06 NOTE — Progress Notes (Signed)
PROGRESS NOTE    Matthew Kline  AYT:[id number] DOB: 16-Jul-1960 DOA: 01/29/2017 PCP: Sandi Mariscal, MD   Brief Narrative: 57 year old male with chronic osteomyelitis status post left AKA in the beginning of January, ESRD on hemodialysis, chronic anemia, presented from rehab center for a fall and change in mental status.  Patient was discharged from hospital on January 20, 2007 on Keflex by orthopedics.  The wound culture from the surgery going staph which is methicillin sensitive.  He was found to have UTI, started on ceftriaxone and admitted for further evaluation.  02/06/17 -patient seen today.  Patient remains altered.  MRI of the brain done today revealed acute left hippocampal infarct.  Neurology team has been consulted.  The patient is already on aspirin and Plavix.  Will proceed with MRA of the head and the neck.  Will check the lipid profile.  Will aggressively treat dyslipidemia.  Assessment & Plan:   #Altered mental status, cause unclear, but may be related to acute left hippocampal infarct: -Continue workup for the acute infarct. -Neurology has been consulted. -Continue to work patient up for other causes of encephalopathy. -Ammonia level not elevated, lactate not elevated, chest x-ray with no pneumonia. -EEG with no seizure. -B12, TSH level normal. -High dose IV thiamine completed  #ESRD on hemodialysis, pulmonary edema and vascular congestion likely chronic:  Maintenance hemodialysis today.  Monitor BMP.  #Left leg amputation by Dr. Sharol Given:  - Concerned about some discharge from the wound site requiring frequent dressing change as per nurse.  - Orthopedic was consulted on 02/03/2017.   - Continue dressing change and supportive care.  #Chronic jerking movement of extremities:  The patient's wife reported that it runs in the family and patient has these jerking movement for a long time.  Unchanged.  # Hypertension:  Optimize.    #Anemia of chronic kidney disease:  Monitor  CBC.  IV iron and ESA during dialysis.  Hemoglobin acceptable.  #History of coronary artery disease:  Continue aspirin, Coreg, Lipitor, Plavix   DVT prophylaxis: Heparin subcutaneous Code Status: Full code Family Communication: No family at bedside. Disposition Plan: Social worker evaluation for a skilled nursing home.  Likely transfer to SNF tomorrow    Consultants:   Nephrology  Neurology.  Procedures: None Antimicrobials: Discontinue vancomycin on 1/22 and discontinue ceftriaxone on 1/25.  Subjective: No complaints. Patient remains altered. Patient is not back to his baseline.  Objective: Vitals:   02/05/17 0507 02/05/17 0956 02/05/17 2139 02/06/17 0539  BP: 129/80 109/65 (!) 151/86 (!) 166/89  Pulse: 84 90 92 (!) 104  Resp: 16 18 18 18   Temp: 97.7 F (36.5 C) 98.4 F (36.9 C) 98.7 F (37.1 C) 98.4 F (36.9 C)  TempSrc: Oral Oral Oral Oral  SpO2: 100% 100% 98% 98%  Weight:   67.5 kg (148 lb 13 oz)     Intake/Output Summary (Last 24 hours) at 02/06/2017 1021 Last data filed at 02/06/2017 0300 Gross per 24 hour  Intake 120 ml  Output -  Net 120 ml   Filed Weights   02/04/17 1315 02/04/17 1800 02/05/17 2139  Weight: 68.4 kg (150 lb 12.7 oz) 66.5 kg (146 lb 9.7 oz) 67.5 kg (148 lb 13 oz)    Examination:  General exam: Lying on bed, not in distress Respiratory system: Clear bilateral, respiratory effort normal.   Cardiovascular system: Regular rate rhythm S1-S2 normal.  No pedal edema. Gastrointestinal system: Abdomen soft, nontender.  Bowel sounds positive Central nervous system: Awake. pinpoint pupil  Data  Reviewed: I have personally reviewed following labs and imaging studies  CBC: Recent Labs  Lab 01/31/17 0755 02/01/17 0019 02/02/17 0700 02/04/17 0652  WBC 13.5* 12.5* 9.7 8.6  HGB 8.9* 9.9* 9.0* 9.2*  HCT 29.3* 32.0* 29.8* 30.4*  MCV 90.4 90.4 89.8 89.9  PLT 213 207 235 937   Basic Metabolic Panel: Recent Labs  Lab 01/31/17 0755  02/01/17 0019 02/02/17 0700 02/04/17 0702  NA 137 140 134* 134*  K 5.8* 4.7 4.8 4.0  CL 95* 98* 94* 93*  CO2 22 23 24 26   GLUCOSE 129* 155* 108* 93  BUN 65* 31* 52* 49*  CREATININE 9.99* 5.72* 7.93* 5.67*  CALCIUM 9.3 9.7 9.3 9.6  PHOS 7.1*  --  5.7* 4.8*   GFR: Estimated Creatinine Clearance: 13.7 mL/min (A) (by C-G formula based on SCr of 5.67 mg/dL (H)). Liver Function Tests: Recent Labs  Lab 01/31/17 0755 02/02/17 0700 02/04/17 0702  ALBUMIN 2.4* 2.4* 2.6*   No results for input(s): LIPASE, AMYLASE in the last 168 hours. No results for input(s): AMMONIA in the last 168 hours. Coagulation Profile: No results for input(s): INR, PROTIME in the last 168 hours. Cardiac Enzymes: No results for input(s): CKTOTAL, CKMB, CKMBINDEX, TROPONINI in the last 168 hours. BNP (last 3 results) No results for input(s): PROBNP in the last 8760 hours. HbA1C: No results for input(s): HGBA1C in the last 72 hours. CBG: Recent Labs  Lab 02/01/17 1132 02/01/17 2054 02/02/17 1023 02/03/17 2204 02/04/17 1853  GLUCAP 133* 117* 88 115* 104*   Lipid Profile: No results for input(s): CHOL, HDL, LDLCALC, TRIG, CHOLHDL, LDLDIRECT in the last 72 hours. Thyroid Function Tests: No results for input(s): TSH, T4TOTAL, FREET4, T3FREE, THYROIDAB in the last 72 hours. Anemia Panel: No results for input(s): VITAMINB12, FOLATE, FERRITIN, TIBC, IRON, RETICCTPCT in the last 72 hours. Sepsis Labs: No results for input(s): PROCALCITON, LATICACIDVEN in the last 168 hours.  Recent Results (from the past 240 hour(s))  Urine culture     Status: None   Collection Time: 01/29/17 11:41 AM  Result Value Ref Range Status   Specimen Description URINE, CLEAN CATCH  Final   Special Requests NONE  Final   Culture NO GROWTH  Final   Report Status 01/30/2017 FINAL  Final  MRSA PCR Screening     Status: None   Collection Time: 01/30/17 12:06 AM  Result Value Ref Range Status   MRSA by PCR NEGATIVE NEGATIVE Final     Comment:        The GeneXpert MRSA Assay (FDA approved for NASAL specimens only), is one component of a comprehensive MRSA colonization surveillance program. It is not intended to diagnose MRSA infection nor to guide or monitor treatment for MRSA infections.   Culture, blood (routine x 2)     Status: None   Collection Time: 01/30/17 12:02 PM  Result Value Ref Range Status   Specimen Description BLOOD RIGHT ANTECUBITAL  Final   Special Requests   Final    BOTTLES DRAWN AEROBIC ONLY Blood Culture adequate volume   Culture NO GROWTH 5 DAYS  Final   Report Status 02/04/2017 FINAL  Final  Culture, blood (routine x 2)     Status: None   Collection Time: 01/30/17 12:09 PM  Result Value Ref Range Status   Specimen Description BLOOD LEFT HAND  Final   Special Requests IN PEDIATRIC BOTTLE Blood Culture adequate volume  Final   Culture NO GROWTH 5 DAYS  Final   Report Status  02/04/2017 FINAL  Final         Radiology Studies: Mr Brain Wo Contrast  Result Date: 02/06/2017 CLINICAL DATA:  [place] out of bed at nursing facility. Unresponsive for the last 4 days. EXAM: MRI HEAD WITHOUT CONTRAST TECHNIQUE: Multiplanar, multiecho pulse sequences of the brain and surrounding structures were obtained without intravenous contrast. COMPARISON:  CT 01/30/2017 FINDINGS: Brain: Subcentimeter acute infarction in the left hippocampus. No other acute infarction. The brainstem and cerebellum are normal. Cerebral hemispheres elsewhere show few old infarctions in the deep white matter an old left parieto-occipital cortical infarction. No mass lesion, hemorrhage, hydrocephalus or extra-axial collection. Vascular: Major vessels at the base of the brain show flow. 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 Scattered old white matter infarctions. 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  Electronically Signed   By: Nelson Chimes M.D.   On: 02/06/2017 09:50        Scheduled Meds: . aspirin EC  162 mg Oral Daily  . atorvastatin  40 mg Oral q1800  . carvedilol  12.5 mg Oral BID WC  . clopidogrel  75 mg Oral q1800  . darbepoetin (ARANESP) injection - DIALYSIS  150 mcg Intravenous Q Fri-HD  . feeding supplement (NEPRO CARB STEADY)  237 mL Oral BID BM  . feeding supplement (PRO-STAT SUGAR FREE 64)  30 mL Oral BID  . ferric citrate  420 mg Oral TID WC  . multivitamin  1 tablet Oral QHS  . sodium chloride flush  3 mL Intravenous Q12H   Continuous Infusions: . sodium chloride    . ferric gluconate (FERRLECIT/NULECIT) IV 125 mg (02/04/17 1454)     LOS: 7 days    Bonnell Public, MD [facility] Pager [phone]  If 7PM-7AM, please contact night-coverage [email or web address] Password [facility] 02/06/2017, 10:21 AM

## 2017-02-07 ENCOUNTER — Inpatient Hospital Stay (HOSPITAL_COMMUNITY): Payer: Medicare HMO

## 2017-02-07 ENCOUNTER — Inpatient Hospital Stay (HOSPITAL_COMMUNITY)
Admit: 2017-02-07 | Discharge: 2017-02-07 | Disposition: A | Discharge status: Home / Self Care | Payer: Medicare HMO | Attending: Student in an Organized Health Care Education/Training Program | Admitting: Student in an Organized Health Care Education/Training Program

## 2017-02-07 DIAGNOSIS — R4182 Altered mental status, unspecified: Secondary | ICD-10-CM

## 2017-02-07 DIAGNOSIS — G9340 Encephalopathy, unspecified: Secondary | ICD-10-CM

## 2017-02-07 DIAGNOSIS — I6330 Cerebral infarction due to thrombosis of unspecified cerebral artery: Secondary | ICD-10-CM

## 2017-02-07 LAB — CBC
HCT: 26.9 % — ABNORMAL LOW (ref 39.0–52.0)
Hemoglobin: 8.5 g/dL — ABNORMAL LOW (ref 13.0–17.0)
MCH: 28.1 pg (ref 26.0–34.0)
MCHC: 31.6 g/dL (ref 30.0–36.0)
MCV: 89.1 fL (ref 78.0–100.0)
Platelets: 226 10*3/uL (ref 150–400)
RBC: 3.02 MIL/uL — ABNORMAL LOW (ref 4.22–5.81)
RDW: 17.4 % — ABNORMAL HIGH (ref 11.5–15.5)
WBC: 7.5 10*3/uL (ref 4.0–10.5)

## 2017-02-07 LAB — RENAL FUNCTION PANEL
Albumin: 2.4 g/dL — ABNORMAL LOW (ref 3.5–5.0)
Anion gap: 15 (ref 5–15)
BUN: 70 mg/dL — ABNORMAL HIGH (ref 6–20)
CO2: 24 mmol/L (ref 22–32)
Calcium: 9 mg/dL (ref 8.9–10.3)
Chloride: 87 mmol/L — ABNORMAL LOW (ref 101–111)
Creatinine, Ser: 7.14 mg/dL — ABNORMAL HIGH (ref 0.61–1.24)
GFR calc Af Amer: 9 mL/min — ABNORMAL LOW (ref >60)
GFR calc non Af Amer: 8 mL/min — ABNORMAL LOW (ref >60)
Glucose, Bld: 111 mg/dL — ABNORMAL HIGH (ref 65–99)
Phosphorus: 4.1 mg/dL (ref 2.5–4.6)
Potassium: 4.1 mmol/L (ref 3.5–5.1)
Sodium: 126 mmol/L — ABNORMAL LOW (ref 135–145)

## 2017-02-07 LAB — HEMOGLOBIN A1C
Hgb A1c MFr Bld: 5.7 % — ABNORMAL HIGH (ref 4.8–5.6)
Mean Plasma Glucose: 116.89 mg/dL

## 2017-02-07 LAB — LIPID PANEL
CHOL/HDL RATIO: 2.5 ratio
Cholesterol: 97 mg/dL (ref 0–200)
HDL: 39 mg/dL — ABNORMAL LOW (ref >40)
LDL CALC: 43 mg/dL (ref 0–99)
Triglycerides: 77 mg/dL (ref <150)
VLDL: 15 mg/dL (ref 0–40)

## 2017-02-07 MED ORDER — SODIUM CHLORIDE 0.9 % IV SOLN: 100.0000 mL | INTRAVENOUS | Status: DC | PRN

## 2017-02-07 MED ORDER — ALTEPLASE 2 MG IJ SOLR: 2.0000 mg | Freq: Once | INTRAMUSCULAR | Status: DC | PRN

## 2017-02-07 MED ORDER — LIDOCAINE-PRILOCAINE 2.5-2.5 % EX CREA: 1.0000 "application " | TOPICAL_CREAM | CUTANEOUS | Status: DC | PRN

## 2017-02-07 MED ORDER — LIDOCAINE HCL (PF) 1 % IJ SOLN: 5.0000 mL | INTRAMUSCULAR | Status: DC | PRN

## 2017-02-07 MED ORDER — PENTAFLUOROPROP-TETRAFLUOROETH EX AERO: 1.0000 "application " | INHALATION_SPRAY | CUTANEOUS | Status: DC | PRN

## 2017-02-07 MED ORDER — OXYCODONE HCL 5 MG PO TABS
5.0000 mg | ORAL_TABLET | Freq: Once | ORAL | Status: AC
  Administered 2017-02-07: 5 mg via ORAL

## 2017-02-07 MED ORDER — OXYCODONE HCL 5 MG PO TABS
ORAL_TABLET | ORAL | Status: AC
  Filled 2017-02-07: qty 1

## 2017-02-07 MED ORDER — HEPARIN SODIUM (PORCINE) 1000 UNIT/ML DIALYSIS: 1000.0000 [IU] | INTRAMUSCULAR | Status: DC | PRN

## 2017-02-07 NOTE — Evaluation (Signed)
Speech Language Pathology Evaluation Patient Details Name: Matthew Kline MRN: [id number] DOB: [birth year] Today's Date: 02/07/2017 Time: 4627-0350 SLP Time Calculation (min) (ACUTE ONLY): 25 min  Problem List:  Patient Active Problem List   Diagnosis Date Noted  . Cerebral thrombosis with cerebral infarction 02/06/2017  . Cerebral embolism with cerebral infarction 02/06/2017  . Above knee amputation status, left ([place])   . Pressure injury of skin 01/30/2017  . Acute lower UTI 01/29/2017  . Acute metabolic encephalopathy 09/38/1829  . UTI (urinary tract infection) 01/29/2017  . Quadriceps muscle rupture, left, initial encounter   . Fall 01/09/2017  . Gait disturbance 01/09/2017  . Staphylococcus aureus infection 01/09/2017  . Infection of prosthetic left knee joint ([place]) 01/09/2017  . Fever, unknown origin 11/09/2016  . Critical lower limb ischemia 08/25/2016  . Ulcer of left midfoot with fat layer exposed ([place]) 08/13/2016  . Diabetic ulcer of left midfoot associated with type 2 diabetes mellitus, with fat layer exposed ([place]) 07/25/2016  . Peripheral neuropathy 07/22/2016  . Tobacco abuse 07/22/2016  . CAD in native artery 05/18/2016  . CAD, multiple vessel 05/11/2016  . Positive cardiac stress test 05/11/2016  . Abnormal stress test 04/30/2016  . Pre-transplant evaluation for kidney transplant 04/30/2016  . S/P revision of total knee 11/26/2015  . Pain in the chest   . Acute on chronic diastolic heart failure ([place]) 07/05/2015  . Volume overload 07/04/2015  . Shortness of breath 07/04/2015  . Hypoxemia 07/04/2015  . Elevated troponin   . End-stage renal disease on hemodialysis ([place])   . Hypervolemia   . 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 Pyogenic bacterial arthritis of knee, left ([place]) 08/07/2014  . Tachycardia 07/24/2014  . Acute upper respiratory infection 07/24/2014  . ESRD on dialysis ([place]) 07/14/2014  . Type II diabetes mellitus ([place]) 07/14/2014   . Anemia in chronic kidney disease 07/14/2014  . Congestive heart failure (CHF) ([place]) 07/13/2014  . Surgical wound dehiscence 05/09/2014  . Dehiscence of closure of skin 05/09/2014  . Total knee replacement status 04/10/2014  . Diabetes mellitus with renal manifestations, controlled ([place]) 10/24/2013  . Hypertensive renal disease 06/27/2013  . DM type 2 causing vascular disease ([place]) 06/27/2013  . Erectile dysfunction 06/27/2013  . Depression 06/27/2013  . Claudication of left lower extremity ([place]) 12/19/2012  . Essential hypertension, benign 12/19/2012  . Sinusitis, acute maxillary 11/22/2012  . Otitis, externa, infective 11/14/2012  . Leg edema, left 11/14/2012  . End stage renal disease ([place]) 10/02/2012  . Controlled type 2 DM with proteinuria or microalbuminuria 09/19/2012  . GERD (gastroesophageal reflux disease) 09/19/2012  . Leukocytosis 09/19/2012  . Lacunar infarction 08/17/2012  . Polymyalgia rheumatica ([place]) 08/17/2012  . 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 Essential hypertension 05/10/2012  . Vitamin D deficiency 05/10/2012  . Diabetes mellitus due to underlying condition ([place]) 05/10/2012  . Hyperlipidemia LDL goal <100 05/10/2012  . Anemia of chronic disease 05/10/2012  . Screening for prostate cancer 05/10/2012  . Chronic kidney disease (CKD), stage IV (severe) ([place]) 05/10/2012  . Peripheral autonomic neuropathy due to DM ([place]) 05/10/2012  . Callus of foot 05/10/2012  . Urgency of urination 05/10/2012  . Hyperkalemia 05/10/2012  . Candidiasis of the esophagus 10/12/2011  . Internal hemorrhoids without mention of complication 93/71/6967  . Pre-syncope 07/25/2009  . DJD (degenerative joint disease) of cervical spine 02/17/2009   Past Medical History:  Past Medical History:  Diagnosis Date  . Anemia, unspecified   . Anxiety   . Arthralgia  2010   polyarticular  . Arthritis    "back, knees" (01/10/2017)  . Cancer [facility])    "kidney area" (01/10/2017)  . CHF  (congestive heart failure) ([place]) 07/25/2009   denies  . Chronic lower back pain   . Coronary artery disease   . Coughing    pt. reports that he has drainage from sinus infection  . Diabetic foot ulcer ([place])   . Diabetic neuropathy ([place])   . ESRD (end stage renal disease) on dialysis [facility])    started 12/2012; "MWF; [facility] "  (01/10/2017)  . GERD (gastroesophageal reflux disease)    hx "before I lost weight", no problem 9 years  . Hemodialysis access site with mature fistula ([place])   . Hemorrhoids, internal 10/2011   small  . High cholesterol   . History of blood transfusion    "related to the anemia"  . Hypertension   . Insomnia, unspecified   . 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 Long term (current) use of anticoagulants   . Myocardial infarction ([place]) 1995  . Orthostatic hypotension   . Osteomyelitis of foot, left, acute ([place])   . Other chronic postoperative pain   . Pneumonia    "probably twice" (01/10/2017)  . Polymyalgia rheumatica ([place])   . Renal insufficiency   . Sleep apnea    "lost weight; no more problem" (01/10/2017)  . Stroke ([place]) 01/10/06   denies residual on 05/09/2014  . Type II diabetes mellitus ([place]) dx'd 1995  . Unspecified hereditary and idiopathic peripheral neuropathy    feet  . Unspecified osteomyelitis, site unspecified   . Unspecified vitamin D deficiency    Past Surgical History:  Past Surgical History:  Procedure Laterality Date  . ABDOMINAL AORTOGRAM N/A 08/25/2016   Procedure: ABDOMINAL AORTOGRAM;  Surgeon: Wellington Hampshire, MD;  Location: [facility];  Service: Cardiovascular;  Laterality: N/A;  . AMPUTATION  01/21/2012   Procedure: AMPUTATION RAY;  Surgeon: [name], MD;  Location: [place];  Service: Orthopedics;  Laterality: Left;  Left Foot 4th Ray Amputation  . AMPUTATION Left 05/04/2013   Procedure: AMPUTATION DIGIT;  Surgeon: [name], MD;  Location: [place];  Service: Orthopedics;  Laterality: Left;  Left  Great Toe Amputation at [facility]  . AMPUTATION Left 01/14/2017   Procedure: AMPUTATION ABOVE LEFT KNEE;  Surgeon: [name], MD;  Location: [place];  Service: Orthopedics;  Laterality: Left;  . ANTERIOR CERVICAL DECOMP/DISCECTOMY FUSION  02/2011  . BACK SURGERY    . BASCILIC VEIN TRANSPOSITION Left 10/19/2012   Procedure: BASCILIC VEIN TRANSPOSITION;  Surgeon: Serafina Mitchell, MD;  Location: [place];  Service: Vascular;  Laterality: Left;  . CARDIAC CATHETERIZATION     "before bypass"  . CORONARY ARTERY BYPASS GRAFT     x 5 with lima at [place] WITH ANTIBIOTIC SPACERS Left 08/07/2014   Procedure: Replace Left Total Knee Arthroplasty,  Place Antibiotic Spacer;  Surgeon: [name], MD;  Location: [place];  Service: Orthopedics;  Laterality: Left;  . I&D EXTREMITY Left 05/09/2014   Procedure: Irrigation and Debridement Left Knee and Closure of Total Knee Arthroplasty Incision;  Surgeon: [name], MD;  Location: [place];  Service: Orthopedics;  Laterality: Left;  . I&D KNEE WITH POLY EXCHANGE Left 05/31/2014   Procedure: IRRIGATION AND DEBRIDEMENT LEFT KNEE, PLACE ANTIBIOTIC BEADS,  POLY EXCHANGE;  Surgeon: [name], MD;  Location: [place];  Service: Orthopedics;  Laterality: Left;  . IRRIGATION AND DEBRIDEMENT KNEE Left 01/12/2017   Procedure: IRRIGATION AND DEBRIDEMENT LEFT KNEE;  Surgeon: [name], MD;  Location: [place];  Service: Orthopedics;  Laterality: Left;  . JOINT REPLACEMENT    . KNEE ARTHROSCOPY Left 08-25-2012  . LOWER EXTREMITY ANGIOGRAPHY Left 08/25/2016   Procedure: Lower Extremity Angiography;  Surgeon: Wellington Hampshire, MD;  Location: [place] [facility];  Service: Cardiovascular;  Laterality: Left;  . PERIPHERAL VASCULAR BALLOON ANGIOPLASTY Left 08/25/2016   Procedure: PERIPHERAL VASCULAR BALLOON ANGIOPLASTY;  Surgeon: Wellington Hampshire, MD;  Location: [place] [facility];  Service: Cardiovascular;  Laterality: Left;  lt peroneal and ant  tibial arteries cutting balloon  . REFRACTIVE SURGERY Bilateral   . TOE AMPUTATION Bilateral    "I've lost 7 toes over the last 7 years" (05/09/2014)  . TOE SURGERY Left April 2015   Big toe removed on left foot.  . TONSILLECTOMY    . TOTAL KNEE ARTHROPLASTY Left 04/10/2014   Procedure: TOTAL KNEE ARTHROPLASTY;  Surgeon: [name], MD;  Location: [place];  Service: Orthopedics;  Laterality: Left;  . TOTAL KNEE REVISION Left 10/25/2014   Procedure: LEFT TOTAL KNEE REVISION;  Surgeon: [name], MD;  Location: [place];  Service: Orthopedics;  Laterality: Left;  . TOTAL KNEE REVISION Left 11/26/2015   Procedure: Removal Left Total Knee Arthroplasty, Hinged Total Knee Arthroplasty;  Surgeon: [name], MD;  Location: [place];  Service: Orthopedics;  Laterality: Left;  . UVULOPALATOPHARYNGOPLASTY, TONSILLECTOMY AND SEPTOPLASTY  ~ 1989  . WOUND DEBRIDEMENT Left 05/09/2014   Dehiscence Left Total Knee Arthroplasty Incision   HPI:  Pt is a 57 y.o. 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 His wound culture grew out staph aureus methicillin sensitive. Patient has been doing well in rehab. Began experiencing nausea, vomiting, diarrhea, abdominal cramps, seemed more confused than usual, fell on his wound and was then brought to ED for further evaluation- admitted with AMS and UTI. Head CT normal. CXR showed Pulmonary vascular congestion with mild interstitial edema. There may be a degree of congestive heart failure. No consolidation.  Bedside swallow eval completed. Advanced to regular/thin. Incidental findings of CVA on MRI, so Cog/com evaluation ordered.   Assessment / Plan / Recommendation Clinical Impression  Pt seen today for cog/com evaluation. The Montreal Cognitive Assessment (MoCA) was administered.  Pt scored 28/30 (n=26+/30) indicating performance within functional limits for pt age and education. Points were lost on thought organization (named 10 category members in 1 minute) and delayed recall (recalled 4/5 unrelated words after 5 minute delay). These deficits raise concern for higher level cognitive impairment, which may adversely affect safety and judgment. Recommend 24 hour supervision at this time, with follow up by speech therapy at next venue for assessment of higher levels of cognition (reasoning, functional problem solving, etc) and executive functions. Pt has made significant improvement in this venue.      SLP Assessment  SLP Recommendation/Assessment: All further Speech Language Pathology  needs can be addressed in the next venue of care SLP Visit Diagnosis: Attention and concentration deficit Attention and concentration deficit following: Cerebral infarction    Follow Up Recommendations  24 hour supervision/assistance;Skilled Nursing facility    Frequency and Duration  defer treatment plan to SLP at next venue  SLP Evaluation Cognition  Overall Cognitive Status: Impaired/Different from baseline Arousal/Alertness: Awake/alert Orientation Level: Oriented to person;Oriented to place;Oriented to time Attention: Focused;Sustained Focused Attention: Appears intact Sustained Attention: Appears intact Memory: Appears intact Problem Solving: Impaired Problem Solving Impairment: Verbal basic Executive Function: Writer: Impaired Organizing Impairment: Verbal basic       Comprehension  Auditory Comprehension Overall Auditory Comprehension: Appears within functional limits for tasks assessed    Expression Expression Primary Mode of Expression: Verbal Verbal Expression Overall Verbal Expression: Appears within functional limits for tasks assessed   Oral / Motor  Oral Motor/Sensory Function Overall Oral Motor/Sensory Function: Within functional limits    GO                   Ksean Vale B. Quentin Ore [facility], CCC-SLP Speech Language Pathologist [phone]  Shonna Chock 02/07/2017, 11:47 AM

## 2017-02-07 NOTE — Progress Notes (Signed)
PROGRESS NOTE    Matthew Kline  NKN:[id number] DOB: [birth year] DOA: 01/29/2017 PCP: Sandi Mariscal, MD   Brief Narrative: Patient is a 57 year old male with chronic osteomyelitis status post left AKA in the beginning of January, ESRD on hemodialysis, chronic anemia, presented from rehab center following a fall and change in mental status.  Patient was discharged from hospital on January 20, 2007 on Keflex by orthopedics.  The wound culture from the surgery is growing staph which is methicillin sensitive.  On presentation, UTI was suspected.  However, the urine culture has not grown any other medicines.  Patient underwent an MRI of the brain that revealed left hippocampal infarct.  MRA of the brain, MRA of the neck have been non-revealing.  The patient's confusion is actually improving.  The patient was much more communicative today.  The patient will undergo hemodialysis today as well.  Hopefully, we will be able to discharge patient back to the facility tomorrow.    02/07/17 -patient seen today.  Patient is improving.  MRI of the brain is essentially as above.  We will continue aspirin and Plavix for now.  We will proceed disposition in the morning.    Assessment & Plan:   #Altered mental status, cause unclear, but may be related to acute left hippocampal infarct: - Further workup for acute infarct has been nonrevealing.   -Neurology input is highly appreciated.   -Ammonia level not elevated, lactate not elevated, chest x-ray with no pneumonia. -EEG with no seizure. -B12, TSH level normal. -High dose IV thiamine completed  #ESRD on hemodialysis, pulmonary edema and vascular congestion likely chronic:  Maintenance hemodialysis today.  Monitor BMP.  #Left leg amputation by Dr. Sharol Given:  -This stump is dressed.  In the loose plane.  #Chronic jerking movement of extremities:  -No jerking noted.  # Hypertension:  Optimize.    #Anemia of chronic kidney disease:  Monitor CBC.  IV iron and ESA  during dialysis.  Hemoglobin acceptable.  #History of coronary artery disease:  Continue aspirin, Coreg, Lipitor, Plavix   DVT prophylaxis: Heparin subcutaneous Code Status: Full code Family Communication: No family at bedside. Disposition Plan: Social worker evaluation for a skilled nursing home.  Likely transfer to SNF tomorrow    Consultants:   Nephrology  Neurology.  Procedures: None Antimicrobials: Discontinue vancomycin on 1/22 and discontinue ceftriaxone on 1/25.  Subjective: No complaints. Patient altered mentation is improving.   Patient is not back to his baseline.  Objective: Vitals:   02/06/17 1642 02/06/17 2121 02/07/17 0441 02/07/17 0956  BP: (!) 165/89 (!) 158/82 (!) 158/84 135/78  Pulse: 93 87 83 75  Resp: 18 18 18 18   Temp: 98.6 F (37 C) 98.3 F (36.8 C) 98.2 F (36.8 C) 98.1 F (36.7 C)  TempSrc: Oral Oral Oral Oral  SpO2: 98% 99% 98% 100%  Weight:  68.5 kg (151 lb 0.2 oz)      Intake/Output Summary (Last 24 hours) at 02/07/2017 1720 Last data filed at 02/07/2017 0956 Gross per 24 hour  Intake 483 ml  Output 0 ml  Net 483 ml   Filed Weights   02/04/17 1800 02/05/17 2139 02/06/17 2121  Weight: 66.5 kg (146 lb 9.7 oz) 67.5 kg (148 lb 13 oz) 68.5 kg (151 lb 0.2 oz)    Examination:  General exam: Lying on bed, not in distress Respiratory system: Clear bilateral, respiratory effort normal.   Cardiovascular system: Regular rate rhythm S1-S2 normal.  No pedal edema. Gastrointestinal system: Abdomen soft, nontender.  Bowel sounds positive Central nervous system: Awake. pinpoint pupil  Data Reviewed: I have personally reviewed following labs and imaging studies  CBC: Recent Labs  Lab 02/01/17 0019 02/02/17 0700 02/04/17 0652  WBC 12.5* 9.7 8.6  HGB 9.9* 9.0* 9.2*  HCT 32.0* 29.8* 30.4*  MCV 90.4 89.8 89.9  PLT 207 235 025   Basic Metabolic Panel: Recent Labs  Lab 02/01/17 0019 02/02/17 0700 02/04/17 0702  NA 140 134* 134*  K  4.7 4.8 4.0  CL 98* 94* 93*  CO2 23 24 26   GLUCOSE 155* 108* 93  BUN 31* 52* 49*  CREATININE 5.72* 7.93* 5.67*  CALCIUM 9.7 9.3 9.6  PHOS  --  5.7* 4.8*   GFR: Estimated Creatinine Clearance: 13.9 mL/min (A) (by C-G formula based on SCr of 5.67 mg/dL (H)). Liver Function Tests: Recent Labs  Lab 02/02/17 0700 02/04/17 0702  ALBUMIN 2.4* 2.6*   No results for input(s): LIPASE, AMYLASE in the last 168 hours. No results for input(s): AMMONIA in the last 168 hours. Coagulation Profile: No results for input(s): INR, PROTIME in the last 168 hours. Cardiac Enzymes: No results for input(s): CKTOTAL, CKMB, CKMBINDEX, TROPONINI in the last 168 hours. BNP (last 3 results) No results for input(s): PROBNP in the last 8760 hours. HbA1C: Recent Labs    02/07/17 0612  HGBA1C 5.7*   CBG: Recent Labs  Lab 02/01/17 1132 02/01/17 2054 02/02/17 1023 02/03/17 2204 02/04/17 1853  GLUCAP 133* 117* 88 115* 104*   Lipid Profile: Recent Labs    02/07/17 0612  CHOL 97  HDL 39*  LDLCALC 43  TRIG 77  CHOLHDL 2.5   Thyroid Function Tests: No results for input(s): TSH, T4TOTAL, FREET4, T3FREE, THYROIDAB in the last 72 hours. Anemia Panel: No results for input(s): VITAMINB12, FOLATE, FERRITIN, TIBC, IRON, RETICCTPCT in the last 72 hours. Sepsis Labs: No results for input(s): PROCALCITON, LATICACIDVEN in the last 168 hours.  Recent Results (from the past 240 hour(s))  Urine culture     Status: None   Collection Time: 01/29/17 11:41 AM  Result Value Ref Range Status   Specimen Description URINE, CLEAN CATCH  Final   Special Requests NONE  Final   Culture NO GROWTH  Final   Report Status 01/30/2017 FINAL  Final  MRSA PCR Screening     Status: None   Collection Time: 01/30/17 12:06 AM  Result Value Ref Range Status   MRSA by PCR NEGATIVE NEGATIVE Final    Comment:        The GeneXpert MRSA Assay (FDA approved for NASAL specimens only), is one component of a comprehensive MRSA  colonization surveillance program. It is not intended to diagnose MRSA infection nor to guide or monitor treatment for MRSA infections.   Culture, blood (routine x 2)     Status: None   Collection Time: 01/30/17 12:02 PM  Result Value Ref Range Status   Specimen Description BLOOD RIGHT ANTECUBITAL  Final   Special Requests   Final    BOTTLES DRAWN AEROBIC ONLY Blood Culture adequate volume   Culture NO GROWTH 5 DAYS  Final   Report Status 02/04/2017 FINAL  Final  Culture, blood (routine x 2)     Status: None   Collection Time: 01/30/17 12:09 PM  Result Value Ref Range Status   Specimen Description BLOOD LEFT HAND  Final   Special Requests IN PEDIATRIC BOTTLE Blood Culture adequate volume  Final   Culture NO GROWTH 5 DAYS  Final   Report  Status 02/04/2017 FINAL  Final         Radiology Studies: Mr Virgel Paling EX Contrast  Addendum Date: 02/06/2017   ADDENDUM REPORT: 02/06/2017 19:28 ADDENDUM: Omitted finding of asymmetric flow related signal in the left transverse and sigmoid dural venous sinus that is likely from left-sided AV fistula. The left subclavian and lower IJ have arterial flow on neck MRA. Electronically Signed   By: Monte Fantasia M.D.   On: 02/06/2017 19:28   Result Date: 02/06/2017 CLINICAL DATA:  Stroke follow-up EXAM: MRA NECK WITHOUT CONTRAST MRA HEAD WITHOUT CONTRAST TECHNIQUE: Angiographic images of the neck were obtained using MRA technique without intravenous contast; Angiographic images of the Circle of Willis were obtained using MRA technique without intravenous contrast. COMPARISON:  Brain MRI from earlier today FINDINGS: MRA NECK FINDINGS Negative visualized arch and great vessels on phase contrast. Antegrade flow in bilateral carotid and vertebral circulation. Bilateral carotids are smooth and widely patent. Duplicated right V1 and V2 segments. Left dominant vertebral artery. No stenosis or beading. MRA HEAD FINDINGS Symmetric carotid arteries. Left dominant  vertebrobasilar circulation. Duplicated right superior cerebellar arteries. There is mild luminal irregularity of right-sided MCA vessels which is likely from artifactual blurring based on source images. There is no definite atherosclerotic irregularity or stenosis. No major branch occlusion. Negative for aneurysm. IMPRESSION: Negative MRA of the head and neck. Electronically Signed: By: Monte Fantasia M.D. On: 02/06/2017 18:44   Mr Jodene Nam Neck Wo Contrast  Addendum Date: 02/06/2017   ADDENDUM REPORT: 02/06/2017 19:28 ADDENDUM: Omitted finding of asymmetric flow related signal in the left transverse and sigmoid dural venous sinus that is likely from left-sided AV fistula. The left subclavian and lower IJ have arterial flow on neck MRA. Electronically Signed   By: Monte Fantasia M.D.   On: 02/06/2017 19:28   Result Date: 02/06/2017 CLINICAL DATA:  Stroke follow-up EXAM: MRA NECK WITHOUT CONTRAST MRA HEAD WITHOUT CONTRAST TECHNIQUE: Angiographic images of the neck were obtained using MRA technique without intravenous contast; Angiographic images of the Circle of Willis were obtained using MRA technique without intravenous contrast. COMPARISON:  Brain MRI from earlier today FINDINGS: MRA NECK FINDINGS Negative visualized arch and great vessels on phase contrast. Antegrade flow in bilateral carotid and vertebral circulation. Bilateral carotids are smooth and widely patent. Duplicated right V1 and V2 segments. Left dominant vertebral artery. No stenosis or beading. MRA HEAD FINDINGS Symmetric carotid arteries. Left dominant vertebrobasilar circulation. Duplicated right superior cerebellar arteries. There is mild luminal irregularity of right-sided MCA vessels which is likely from artifactual blurring based on source images. There is no definite atherosclerotic irregularity or stenosis. No major branch occlusion. Negative for aneurysm. IMPRESSION: Negative MRA of the head and neck. Electronically Signed: By: Monte Fantasia M.D. On: 02/06/2017 18:44   Mr Brain Wo Contrast  Result Date: 02/06/2017 CLINICAL DATA:  [place] out of bed at nursing facility. Unresponsive for the last 4 days. EXAM: MRI HEAD WITHOUT CONTRAST TECHNIQUE: Multiplanar, multiecho pulse sequences of the brain and surrounding structures were obtained without intravenous contrast. COMPARISON:  CT 01/30/2017 FINDINGS: Brain: Subcentimeter acute infarction in the left hippocampus. No other acute infarction. The brainstem and cerebellum are normal. Cerebral hemispheres elsewhere show few old infarctions in the deep white matter an old left parieto-occipital cortical infarction. No mass lesion, hemorrhage, hydrocephalus or extra-axial collection. Vascular: Major vessels at the base of the brain show flow. Skull and upper cervical spine: Negative Sinuses/Orbits: Clear/normal Other: None IMPRESSION: Subcentimeter acute infarction in the  left hippocampus. Scattered old white matter infarctions. 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 Electronically Signed   By: Nelson Chimes M.D.   On: 02/06/2017 09:50        Scheduled Meds: .  stroke: mapping our early stages of recovery book   Does not apply Once  . aspirin EC  162 mg Oral Daily  . atorvastatin  80 mg Oral q1800  . carvedilol  12.5 mg Oral BID WC  . clopidogrel  75 mg Oral q1800  . darbepoetin (ARANESP) injection - DIALYSIS  150 mcg Intravenous Q Fri-HD  . feeding supplement (NEPRO CARB STEADY)  237 mL Oral BID BM  . feeding supplement (PRO-STAT SUGAR FREE 64)  30 mL Oral BID  . ferric citrate  420 mg Oral TID WC  . multivitamin  1 tablet Oral QHS  . sodium chloride flush  3 mL Intravenous Q12H   Continuous Infusions: . sodium chloride    . ferric gluconate (FERRLECIT/NULECIT) IV 125 mg (02/04/17 1454)     LOS: 8 days    Bonnell Public, MD [facility] Pager [phone]  If 7PM-7AM, please contact night-coverage [email or web address] Password  [email or web address] 02/07/2017, 5:20 PM

## 2017-02-07 NOTE — Progress Notes (Signed)
NEUROHOSPITALISTS STROKE TEAM - DAILY PROGRESS NOTE   ADMISSION HISTORY: Amedio Bowlby is a 57 y.o. male who has a past medical history of chronic low back pain, coronary artery disease, diabetes with complications, ESRD on dialysis, gastroesophageal reflux disease, hypertension, hyperlipidemia, stroke in the past that left him with some speech problems that have since completely resolved, and a recent left AKA due to chronic osteomyelitis, brought into the hospital on 1/19 from a rehab center for confusion.   History of alcohol abuse and lab evaluation showing UTI led to high-dose thiamine treatment and treatment of the UTI.  An MRI of the brain was done for some reason today, presumably as a part of completing the workup for confusion which showed a small acute left hippocampal infarct.   Patient was unable to tell me if the cause was found for his prior stroke.   He recently quit smoking and had been a heavy smoker. He also was morbidly obese and lost weight without surgery.   He continues to be altered off and on per chart review.   He has been complaining of memory problems ongoing for the past few months.  LKW: Many days ago tpa given?: no, outside the window Premorbid modified Rankin scale (mRS):3 NIHSS = 1  SUBJECTIVE (INTERVAL HISTORY) No family is at the bedside. Patient is found laying in bed in NAD. Overall he feels his condition is gradually improving. Voices no new complaints. No new events reported overnight.   OBJECTIVE Lab Results: CBC:  Recent Labs  Lab 02/01/17 0019 02/02/17 0700 02/04/17 0652  WBC 12.5* 9.7 8.6  HGB 9.9* 9.0* 9.2*  HCT 32.0* 29.8* 30.4*  MCV 90.4 89.8 89.9  PLT 207 235 220   BMP: Recent Labs  Lab 02/01/17 0019 02/02/17 0700 02/04/17 0702  NA 140 134* 134*  K 4.7 4.8 4.0  CL 98* 94* 93*  CO2 23 24 26   GLUCOSE 155* 108* 93  BUN 31* 52* 49*  CREATININE 5.72* 7.93* 5.67*  CALCIUM 9.7  9.3 9.6  PHOS  --  5.7* 4.8*   Liver Function Tests:  Recent Labs  Lab 02/02/17 0700 02/04/17 0702  ALBUMIN 2.4* 2.6*   Urine Drug Screen:     Component Value Date/Time   LABOPIA NONE DETECTED 01/29/2017 1141   COCAINSCRNUR NONE DETECTED 01/29/2017 1141   LABBENZ POSITIVE (A) 01/29/2017 1141   AMPHETMU NONE DETECTED 01/29/2017 1141   THCU NONE DETECTED 01/29/2017 1141   LABBARB NONE DETECTED 01/29/2017 1141    PHYSICAL EXAM Temp:  [98 F (36.7 C)-98.6 F (37 C)] 98.2 F (36.8 C) (01/28 0441) Pulse Rate:  [83-95] 83 (01/28 0441) Resp:  [18] 18 (01/28 0441) BP: (151-165)/(82-91) 158/84 (01/28 0441) SpO2:  [98 %-99 %] 98 % (01/28 0441) Weight:  [68.5 kg (151 lb 0.2 oz)] 68.5 kg (151 lb 0.2 oz) (01/27 2121) General - Well nourished, well developed, in no apparent distress HEENT-  Normocephalic,  Cardiovascular - Regular rate and rhythm  Respiratory - Lungs clear bilaterally. No wheezing. Abdomen - soft and non-tender, BS normal Extremities- no edema or cyanosis NEURO:  Mental Status: Alert and oriented x5, poor attention and short term memory Language: speech is non-dysarthric.  Naming, repetition, fluency, and comprehension intact. Cranial Nerves: PERRL. EOMI, visual fields full, no facial asymmetry, facial sensation intact, hearing intact, tongue/uvula/soft palate midline, normal sternocleidomastoid and trapezius muscle strength. No evidence of tongue atrophy or fibrillations Motor: 5/5 in both upper extremities.  5/5 in the right lower extremity.  5/5 in the left hip.  Left AKA.  No asterixis on outstretched arms noted on exam today Tone: is normal and bulk is normal Sensation- Intact to light touch bilaterally Coordination: FTN intact bilaterally Gait- deferred  IMAGING: I have personally reviewed the radiological images below and agree with the radiology interpretations.  Mr [facility] Lavetta Nielsen Wo Contrast  Addendum Date: 02/06/2017   ADDENDUM REPORT: 02/06/2017 19:28  ADDENDUM: Omitted finding of asymmetric flow related signal in the left transverse and sigmoid dural venous sinus that is likely from left-sided AV fistula. The left subclavian and lower IJ have arterial flow on neck MRA.  IMPRESSION: Negative MRA of the head and neck. Electronically Signed: By: Monte Fantasia M.D. On: 02/06/2017 18:44   Mr Brain Wo Contrast Result Date: 02/06/2017 IMPRESSION: Subcentimeter acute infarction in the left hippocampus. Scattered old white matter infarctions. 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 Electronically Signed   By: Nelson Chimes M.D.   On: 02/06/2017 09:50   Echocardiogram:                                              PENDING  EEG:                                                                   PENDING    IMPRESSION: Mr. Osbaldo Mark is a 57 y.o. male with PMH of ESRD, diabetes, coronary artery disease and hypertension, Hx stroke with no residual deficits, presenting for evaluation of altered mental status. MRI done reveals a left hippocampal stroke. Toxic metabolic encephalopathy secondary to UTI-resolved  Subcentimeter acute infarction in the left hippocampus.  Suspected Etiology: athero vs cardioembolic source  Resultant Symptoms:  confusion Stroke Risk Factors: hyperlipidemia, hypertension and smoking Other Stroke Risk Factors: Advanced age, CAD, ESRD/HD, DM, Hx CVA, OSA  Outstanding Stroke Work-up Studies:     Echocardiogram:                                                    PENDING EEG:                                                                      PENDING  02/07/2017 ASSESSMENT:   Neuro exam non-focal. No AMS or weakness noted on exam today. Symptoms likely related to ongoing medical issues. No seizure activity reported overnight. ECHO and EEG pending  PLAN  02/07/2017: Continue Aspirin/ Plavix/Statin Frequent neuro checks Telemetry monitoring PT/OT/SLP Consult PM & Rehab Consult Case Management  /MSW Ongoing aggressive stroke risk factor management Patient counseled to be compliant with his antithrombotic medications Patient counseled on Lifestyle modifications including, Diet, Exercise, and Stress Follow up with [facility] in 6 weeks  HX OF STROKES:  Old left parieto-occipital cortical and subcortical infarction.   R/O SEIZURES: EEG- PENDING No seizure activity reported overnight, AMS resolved, appears at baseline today Maintain Seizure precautions  HYPERTENSION: Stable Long term BP goal normotensive. May slowly restart home B/P medications Home Meds: Coreg, Norvasc  HYPERLIPIDEMIA:    Component Value Date/Time   CHOL 97 02/07/2017 0612   CHOL 228 (H) 09/15/2012 1030   TRIG 77 02/07/2017 0612   HDL 39 (L) 02/07/2017 0612   HDL 40 09/15/2012 1030   CHOLHDL 2.5 02/07/2017 0612   VLDL 15 02/07/2017 0612   LDLCALC 43 02/07/2017 0612   LDLCALC 113 (H) 09/15/2012 1030  Home Meds:  Lipitor 40 mg LDL  goal < 70 Increased to Lipitor to 80 mg daily Continue statin at discharge  DIABETES: Lab Results  Component Value Date   HGBA1C 5.7 (H) 02/07/2017  HgbA1c goal < 7.0 Continue CBG monitoring and SSI to maintain glucose 140-180 mg/dl DM education   TOBACCO ABUSE & POLYSUBSTANCE ABUSE UDS+ Current smoker Smoking cessation counseling provided Nicotine patch provided  Other Active Problems: Principal Problem:   Acute metabolic encephalopathy Active Problems:   Essential hypertension   Anemia of chronic disease   Chronic kidney disease (CKD), stage IV (severe) (HCC)   Hyperkalemia   Diabetes mellitus with renal manifestations, controlled (HCC)   End-stage renal disease on hemodialysis (HCC)   CAD, multiple vessel   Acute lower UTI   UTI (urinary tract infection)   Above knee amputation status, left (HCC)   Cerebral thrombosis with cerebral infarction   Cerebral embolism with cerebral infarction    Hospital day # 8 VTE prophylaxis: SCD's   Diet : Fall precautions Fall precautions Diet renal with fluid restriction Fluid restriction: 1200 mL Fluid; Room service appropriate? Yes; Fluid consistency: Thin   FAMILY UPDATES: No family at bedside  TEAM UPDATES: Bonnell Public, MD   Prior Home Stroke Medications:  aspirin 81 mg daily  Discharge Stroke Meds:  Please discharge patient on ASA 162 mg   Disposition: 03-Skilled Nursing Facility Therapy Recs:               PENDING Follow Up:  Follow-up Information    Dennie Bible, NP. Schedule an appointment as soon as possible for a visit in 6 week(s).   Specialty:  Family Medicine Contact information: [address] [place] [address] [phone]          Sandi Mariscal, MD -PCP Follow up in 1-2 weeks      Assessment & plan discussed with with attending physician and they are in agreement.    Renie Ora Stroke Neurology Team 02/07/2017 9:52 AM  ATTENDING NOTE: I reviewed above note and agree with the assessment and plan. I have made any additions or clarifications directly to the above note. Pt was seen and examined.   57 year old male with complicated past medical history including renal carcinoma, CHF, CAD/MI, DM, PAD, alcohol use, ESRD on hemodialysis, HLD, HTN, polyarthritis rheumatica, recent left AKA, smoker and a history of a stroke 04/2014 without residual.  Admitted on 01/29/17 for confusion, found to have UTI, treated with antibiotics.  However, patient continued to have fluctuation of altered mental status and confusion, EEG 01/31/17 showed diffuse slowing with left occipital sharp waves but no seizure.  CT head 01/29/17 and 01/30/17 showed no acute abnormalities.  However, MRI on 02/06/17 showed left hippocampal small infarcts and left occipital old infarct.  MRA head and neck showed left supraclinoid  ICA stenosis otherwise no significant findings.  Patient EEG left occipital sharp wave may be explained by his old left occipital  infarct.  Repeat EEG today showed diffuse slowing but no epileptiform discharges.  2D echo pending.  A1c 5.7 and LDL 43.  He has been on aspirin Lipitor and Plavix PTA, continue DAPT and Lipitor for stroke prevention.  Patient mental status much improved today, awake alert orientated, mild lethargy, but moving all extremities.  His hippocampal infarct may be part of the cause of his AMS, however, the stroke is too small to be so low because of AMS.  UTI and other metabolic encephalopathy also likely explain AMS.  Neurology will sign off. Please call with questions. Pt will follow up with Cecille Rubin, NP, at [facility] in about 6 weeks. Thanks for the consult.   Rosalin Hawking, MD PhD Stroke Neurology 02/07/2017 6:20 PM     To contact Stroke Continuity provider, please refer to [email or web address] After hours, contact General Neurology

## 2017-02-07 NOTE — Care Management Important Message (Signed)
Important Message  Patient Details  Name: Matthew Kline MRN: [id number] Date of Birth: 05/29/1960   Medicare Important Message Given:  Yes    Candido Flott Montine Circle 02/07/2017, 1:38 PM

## 2017-02-07 NOTE — Progress Notes (Signed)
Physical Therapy Treatment Patient Details Name: Matthew Kline MRN: [id number] DOB: 06-21-1960 Today's Date: 02/07/2017    History of Present Illness Pt is a 57 year old male with chronic osteomyelitis status post left AKA in the beginning of January, ESRD on hemodialysis, and chronic anemia. He presented from rehab center for a fall and change in mental status.  Patient was recently discharged from hospital on January 19, 2017 on Keflex by orthopedics.      PT Comments    Pt less confused and able to be redirected to task today.  Emphasis on bed mobility, standing, short distance ambulation with RW and standing tolerance during pericare.   Follow Up Recommendations  SNF     Equipment Recommendations  None recommended by PT    Recommendations for Other Services       Precautions / Restrictions Precautions Precautions: Fall Restrictions Weight Bearing Restrictions: Yes LLE Weight Bearing: Non weight bearing    Mobility  Bed Mobility Overal bed mobility: Needs Assistance Bed Mobility: Supine to Sit     Supine to sit: Min guard     General bed mobility comments: pt scooted to EOB under good control with bil UE assist  Transfers Overall transfer level: Needs assistance Equipment used: Rolling walker (2 wheeled) Transfers: Sit to/from Stand Sit to Stand: Min assist;+2 physical assistance         General transfer comment: cues for hand placement and stability/boost assist to stand  Ambulation/Gait Ambulation/Gait assistance: Min assist;+2 physical assistance Ambulation Distance (Feet): 6 Feet   Gait Pattern/deviations: Step-to pattern     General Gait Details: cues for posture and focus on knee extension   Stairs            Wheelchair Mobility    Modified Rankin (Stroke Patients Only)       Balance Overall balance assessment: Needs assistance Sitting-balance support: No upper extremity supported;Feet supported Sitting balance-Leahy Scale:  Fair       Standing balance-Leahy Scale: Poor Standing balance comment: reliant on the RW, stood in RW for approx 1 min for pericare after a large stool.                            Cognition Arousal/Alertness: Awake/alert Behavior During Therapy: Flat affect Overall Cognitive Status: Impaired/Different from baseline                                        Exercises      General Comments        Pertinent Vitals/Pain Pain Assessment: Faces Faces Pain Scale: Hurts a little bit Pain Location: vague stomach/rectum Pain Descriptors / Indicators: Discomfort;Grimacing Pain Intervention(s): Monitored during session    Home Living                      Prior Function            PT Goals (current goals can now be found in the care plan section) Acute Rehab PT Goals Patient Stated Goal: I need to get to rehab again before going home. PT Goal Formulation: With patient Time For Goal Achievement: 02/15/17 Potential to Achieve Goals: Fair Progress towards PT goals: Progressing toward goals    Frequency    Min 2X/week      PT Plan Current plan remains appropriate;Frequency needs to be updated    Co-evaluation PT/OT/SLP  Co-Evaluation/Treatment: Yes Reason for Co-Treatment: For patient/therapist safety PT goals addressed during session: Mobility/safety with mobility        AM-PAC PT "6 Clicks" Daily Activity  Outcome Measure  Difficulty turning over in bed (including adjusting bedclothes, sheets and blankets)?: None Difficulty moving from lying on back to sitting on the side of the bed? : None Difficulty sitting down on and standing up from a chair with arms (e.g., wheelchair, bedside commode, etc,.)?: A Little Help needed moving to and from a bed to chair (including a wheelchair)?: A Little Help needed walking in hospital room?: A Little Help needed climbing 3-5 steps with a railing? : A Lot 6 Click Score: 19    End of Session    Activity Tolerance: Patient tolerated treatment well Patient left: in chair;with call bell/phone within reach;with chair alarm set Nurse Communication: Mobility status PT Visit Diagnosis: Other abnormalities of gait and mobility (R26.89)     Time: 8638-1771 PT Time Calculation (min) (ACUTE ONLY): 33 min  Charges:  $Therapeutic Activity: 8-22 mins                    G Codes:       02-15-17  Donnella Sham, PT [phone]  (pager)   Tessie Fass Rosaisela Jamroz 02/15/17, 11:21 AM

## 2017-02-07 NOTE — Care Management Note (Signed)
Case Management Note  Patient Details  Name: Matthew Kline MRN: [id number] Date of Birth: [birth year]  Subjective/Objective:  Recent Left AKA just had discharge on January 19, 2017 was discharged on Keflex per orthopedic surgery.   Admitted for Acute metabolic encephalopathy;        Action/Plan: started on high dose thiamine; Neprology consulted  Expected Discharge Date:                  Expected Discharge Plan:  [place]  In-House Referral:  (S) Clinical Social Research scientist (medical))  Discharge planning Services  CM Consult  Status of Service:  In process, will continue to follow  Additional Comments: Prior to admission patient lived at [facility] for Inpatient rehab.  "Matthew Kline"  CSW is following for skilled Nursing facility.  Kristen Cardinal, RN  Nurse case manager 20M [phone] 02/07/2017, 3:35 PM

## 2017-02-07 NOTE — Clinical Social Work Note (Signed)
CSW contacted [employer] as patient's insurance authorization received on 1/24 has expired. Requested clinicals transmitted to [employer] and CSW was advised by [facility] that they received authorization. Per MD, patient should be ready for discharge on Tuesday, 1/29. CSW will continue to follow and facilitate discharge to [facility] hopefully in 1/29.  Lorain Keast Givens, MSW, LCSW Licensed Clinical Social Worker [place] [phone]

## 2017-02-07 NOTE — Progress Notes (Signed)
Routine EEG completed, results pending. 

## 2017-02-07 NOTE — Progress Notes (Addendum)
Occupational Therapy Treatment Patient Details Name: Matthew Kline MRN: [id number] DOB: 1960-02-15 Today's Date: 02/07/2017    History of present illness Pt is a 57 year old male with chronic osteomyelitis status post left AKA in the beginning of January, ESRD on hemodialysis, and chronic anemia. He presented from rehab center for a fall and change in mental status.  Patient was recently discharged from hospital on January 19, 2017 on Keflex by orthopedics.     OT comments  Pt progressing towards goals, demonstrating increased cognitive status this session and able to attend to functional tasks with intermittent verbal cues for redirection. Pt completing functional mobility within room at RW level with MinA+2, required MaxA (+2) for toileting this session. Continue to recommend SNF level services after discharge to maximize pt's safety and independence with ADLs and mobility prior to return home. Will continue to follow acutely.   Follow Up Recommendations  SNF;Supervision/Assistance - 24 hour    Equipment Recommendations  Other (comment)(defer to next venue )          Precautions / Restrictions Precautions Precautions: Fall Restrictions Weight Bearing Restrictions: Yes LLE Weight Bearing: Non weight bearing       Mobility Bed Mobility Overal bed mobility: Needs Assistance Bed Mobility: Supine to Sit     Supine to sit: Min guard     General bed mobility comments: pt scooted to EOB under good control with bil UE assist  Transfers Overall transfer level: Needs assistance Equipment used: Rolling walker (2 wheeled) Transfers: Sit to/from Stand Sit to Stand: Min assist;+2 physical assistance         General transfer comment: cues for hand placement and stability/boost assist to stand    Balance Overall balance assessment: Needs assistance Sitting-balance support: No upper extremity supported;Feet supported Sitting balance-Leahy Scale: Fair       Standing  balance-Leahy Scale: Poor Standing balance comment: reliant on the RW, stood in RW for approx 1 min for pericare after a large stool.                           ADL either performed or assessed with clinical judgement   ADL Overall ADL's : Needs assistance/impaired                         Toilet Transfer: Minimal assistance;+2 for physical assistance;Ambulation;BSC;RW   Toileting- Clothing Manipulation and Hygiene: Maximal assistance;+2 for safety/equipment;+2 for physical assistance;Sit to/from stand Toileting - Clothing Manipulation Details (indicate cue type and reason): assist to maintain standing balance while additional assist completes peri-care      Functional mobility during ADLs: Minimal assistance;+2 for physical assistance;+2 for safety/equipment;Rolling walker General ADL Comments: Pt completed functional mobility short distance within room at RW level with MinA+2 (EOB to sink); Pt incontinent of large BM during mobility to sink, Pt transitioned to sitting on BSC for completion of toileting with MaxA (+2) for task completion                        Cognition Arousal/Alertness: Awake/alert Behavior During Therapy: Flat affect Overall Cognitive Status: Impaired/Different from baseline Area of Impairment: Attention;Memory;Following commands                   Current Attention Level: Sustained Memory: Decreased short-term memory;Decreased recall of precautions Following Commands: Follows one step commands with increased time Safety/Judgement: Decreased awareness of safety Awareness: Emergent Problem Solving: Slow processing;Decreased initiation;Difficulty  sequencing;Requires verbal cues;Requires tactile cues General Comments: Pt with increased awareness today including awareness of recent confusion; Pt is able to focus on task though is easily distracted and not able to perform dual tasks (i.e. Pt not able to perform bed mobility and answer  therapist's questions at the same time); Pt following one step commands with increased time and mutimodal cues        Exercises Exercises: (general L LE mobility)            Pertinent Vitals/ Pain       Pain Assessment: Faces Faces Pain Scale: Hurts a little bit Pain Location: vague stomach/rectum Pain Descriptors / Indicators: Discomfort;Grimacing Pain Intervention(s): Monitored during session                                                          Frequency  Min 2X/week        Progress Toward Goals  OT Goals(current goals can now be found in the care plan section)  Progress towards OT goals: Progressing toward goals  Acute Rehab OT Goals Patient Stated Goal: I need to get to rehab again before going home. OT Goal Formulation: Patient unable to participate in goal setting Time For Goal Achievement: 02/16/17 Potential to Achieve Goals: Good  Plan Discharge plan remains appropriate    Co-evaluation    PT/OT/SLP Co-Evaluation/Treatment: Yes Reason for Co-Treatment: For patient/therapist safety PT goals addressed during session: Mobility/safety with mobility OT goals addressed during session: ADL's and self-care      AM-PAC PT "6 Clicks" Daily Activity     Outcome Measure   Help from another person eating meals?: A Little Help from another person taking care of personal grooming?: A Little Help from another person toileting, which includes using toliet, bedpan, or urinal?: A Lot Help from another person bathing (including washing, rinsing, drying)?: A Lot Help from another person to put on and taking off regular upper body clothing?: A Little Help from another person to put on and taking off regular lower body clothing?: A Lot 6 Click Score: 15    End of Session Equipment Utilized During Treatment: Gait belt;Rolling walker  OT Visit Diagnosis: Other symptoms and signs involving cognitive function;Muscle weakness (generalized)  (M62.81);Other abnormalities of gait and mobility (R26.89)   Activity Tolerance Patient tolerated treatment well   Patient Left with call bell/phone within reach;in chair;with chair alarm set   Nurse Communication Mobility status        Time: 9794-8016 OT Time Calculation (min): 33 min  Charges: OT General Charges $OT Visit: 1 Visit OT Treatments $Self Care/Home Management : 8-22 mins  Lou Cal, OT Pager [phone] 02/07/2017    Matthew Kline 02/07/2017, 11:33 AM

## 2017-02-07 NOTE — Procedures (Signed)
HPI:  57 y/o with MS change  TECHNICAL SUMMARY:  A multichannel referential and bipolar montage EEG using the standard international 10-20 system was performed on the patient described as drowsy.  Very little waking rhythm is noted and most of the recording is spent in sleep.  When most awake, there is a 6-7 Hz occipital dominant rhythm.  Low voltage fast activity is distributed symmetrically and maximally over the anterior head regions.    ACTIVATION:  Stepwise photic stimulation and hyperventilation is not performed.  EPILEPTIFORM ACTIVITY:  There were no spikes, sharp waves or paroxysmal activity.  SLEEP: As above, most of this recording is spent in stage II sleep architecture.  CARDIAC:  The EKG lead revealed a regular sinus rhythm.  IMPRESSION:  This is an abnormal EEG demonstrating a mild diffuse slowing of electrocerebral activity.  This can be seen in a wide variety of encephalopathic state including those of a toxic, metabolic, or degenerative nature.  There were no focal, hemispheric, or lateralizing features.  No epileptiform activity was recorded.  Most of this recording was spent in stage II sleep, and the patient was extremely drowsy throughout the recording.  Very little waking rhythm was seen during the recording.

## 2017-02-07 NOTE — Progress Notes (Signed)
HD tx initiated via 15Gx2 w/o problem, pull/push/flush equally w/o problem, VSS, will cont to monitor while on HD tx 

## 2017-02-07 NOTE — Progress Notes (Signed)
Matthew Kline Progress Note   Subjective:  Alert, conversant. Talking on phone with wife. C/o hemorrhoid, rectal pain  Objective Vitals:   02/06/17 1642 02/06/17 2121 02/07/17 0441 02/07/17 0956  BP: (!) 165/89 (!) 158/82 (!) 158/84 135/78  Pulse: 93 87 83 75  Resp: 18 18 18 18   Temp: 98.6 F (37 C) 98.3 F (36.8 C) 98.2 F (36.8 C) 98.1 F (36.7 C)  TempSrc: Oral Oral Oral Oral  SpO2: 98% 99% 98% 100%  Weight:  68.5 kg (151 lb 0.2 oz)     Physical Exam General: WNWD male NAD  Heart: RRR Lungs: CTAB Abdomen: soft NT +BS Extremities: L AKA wrapped no R LE edema  Dialysis Access: LUE AVF +bruit    Additional Objective  Recent Labs  Lab 02/01/17 0019 02/02/17 0700 02/04/17 0702  NA 140 134* 134*  K 4.7 4.8 4.0  CL 98* 94* 93*  CO2 23 24 26   GLUCOSE 155* 108* 93  BUN 31* 52* 49*  CREATININE 5.72* 7.93* 5.67*  CALCIUM 9.7 9.3 9.6  PHOS  --  5.7* 4.8*    Recent Labs  Lab 02/01/17 0019 02/02/17 0700 02/04/17 0652  WBC 12.5* 9.7 8.6  HGB 9.9* 9.0* 9.2*  HCT 32.0* 29.8* 30.4*  MCV 90.4 89.8 89.9  PLT 207 235 220       Component Value Date/Time   SDES BLOOD LEFT HAND 01/30/2017 1209   SPECREQUEST IN PEDIATRIC BOTTLE Blood Culture adequate volume 01/30/2017 1209   CULT NO GROWTH 5 DAYS 01/30/2017 1209   REPTSTATUS 02/04/2017 FINAL 01/30/2017 1209    Recent Labs  Lab 02/01/17 1132 02/01/17 2054 02/02/17 1023 02/03/17 2204 02/04/17 1853  GLUCAP 133* 117* 88 115* 104*    Lab Results  Component Value Date   INR 1.30 11/08/2016   INR 1.2 08/17/2016   INR 1.15 10/07/2014   Medications: . sodium chloride    . ferric gluconate (FERRLECIT/NULECIT) IV 125 mg (02/04/17 1454)   .  stroke: mapping our early stages of recovery book   Does not apply Once  . aspirin EC  162 mg Oral Daily  . atorvastatin  80 mg Oral q1800  . carvedilol  12.5 mg Oral BID WC  . clopidogrel  75 mg Oral q1800  . darbepoetin (ARANESP) injection - DIALYSIS   150 mcg Intravenous Q Fri-HD  . feeding supplement (NEPRO CARB STEADY)  237 mL Oral BID BM  . feeding supplement (PRO-STAT SUGAR FREE 64)  30 mL Oral BID  . ferric citrate  420 mg Oral TID WC  . multivitamin  1 tablet Oral QHS  . sodium chloride flush  3 mL Intravenous Q12H   Dialysis Orders:  MWF NW 4h  400/800  70kg Will have lower EDW at discharge 2/2 bath  AVF  Hep 4000 -Mircera 264mcg IV q 2 weeks (last given during [facility] admit) - Venofer 100mg  x 5 ordered (3 given) - Hectoral 27mcg IV q HD - BMM: Auryxia 1/meals   Assessment/Plan: 1. AMS?acute metabolic encephalopathy-no + cultures; ATB's stopped. MRI this am showed sub cm acute infarct in the left hippocampus Further w/u per neuro 2. Acute left hippocampal stroke - neuro is evaluating 3. ESRD-MWF HD -  start on 3 K bath pending labs Will have lower EDW at discharge 4. Anemiaof CKD-Hgb 9.2,follow.Aranesp 165mcg qwk- resumed 1/25 5. Secondary hyperparathyroidism- Cor Ca ^, Hectorol stopped. Pbetter 6. Nutrition- Renal diet w/ fluid restriction.losing weight - add renavite; has prostat ordered 7. Recent LAKA-  01/14/17 Dr. Sharol Given  8. CAD- per primary 9. Chronic jerking of extremities - chronic/ intermittent, per primary- no documented neurontin/pain meds in the past 7 days; had one dose ativan 1/19 10. Failure to thrive - progressive decline in health over past 2-3 years. Consider pall care input.    Lynnda Child PA-C [facility] Pager [phone] 02/07/2017,10:02 AM  LOS: 8 days   I have seen and examined this patient and agree with plan and assessment in the above note with renal recommendations/intervention highlighted. Admitted for AMS. L hippocampal stroke on MRI. Neuro is evaluating. For HD tonight (usual MWF schedule)  Chanley Mcenery B,MD 02/07/2017 7:00 PM

## 2017-02-07 NOTE — Progress Notes (Signed)
MD-- patient having difficulty passing impacted stool.  Digitally disimpacted however pt c/o "unbareable pain" and would not allow much to be removed.  Rectal suppository given with no stool return after 6 hours.  Please advise, perhaps pt can have enema.

## 2017-02-08 ENCOUNTER — Inpatient Hospital Stay (HOSPITAL_COMMUNITY): Payer: Medicare HMO

## 2017-02-08 DIAGNOSIS — I10 Essential (primary) hypertension: Secondary | ICD-10-CM

## 2017-02-08 LAB — ECHOCARDIOGRAM COMPLETE
Ao-asc: 33 cm
Area-P 1/2: 4.23 cm2
E decel time: 176 msec
FS: 20 % — AB (ref 28–44)
IVS/LV PW RATIO, ED: 0.98
LA ID, A-P, ES: 52 mm
LA diam end sys: 52 mm
LA diam index: 2.84 cm/m2
LA vol A4C: 85 ml
LA vol index: 51.1 mL/m2
LA vol: 93.7 mL
LV PW d: 12 mm — AB (ref 0.6–1.1)
LVOT area: 2.84 cm2
LVOT diameter: 19 mm
MV Dec: 176
MV Peak grad: 5 mmHg
MV pk A vel: 85.9 m/s
MV pk E vel: 111 m/s
P 1/2 time: 52 ms
PV Reg grad dias: 7 mmHg
PV Reg vel dias: 129 cm/s
RV sys press: 35 mmHg
Reg peak vel: 281 cm/s
TAPSE: 14.7 mm
TR max vel: 281 cm/s
Weight: 2380.97 oz

## 2017-02-08 MED ORDER — CARVEDILOL 12.5 MG PO TABS: 12.5000 mg | ORAL_TABLET | Freq: Two times a day (BID) | ORAL | 0 refills | Status: AC

## 2017-02-08 MED ORDER — FERRIC CITRATE 1 GM 210 MG(FE) PO TABS: 420.0000 mg | ORAL_TABLET | Freq: Three times a day (TID) | ORAL | 0 refills | Status: DC

## 2017-02-08 MED ORDER — GABAPENTIN 300 MG PO CAPS: ORAL_CAPSULE | ORAL | 0 refills | Status: DC

## 2017-02-08 MED ORDER — DARBEPOETIN ALFA 150 MCG/0.3ML IJ SOSY: 150.0000 ug | PREFILLED_SYRINGE | INTRAMUSCULAR | 0 refills | Status: AC

## 2017-02-08 MED ORDER — RENA-VITE PO TABS: 1.0000 | ORAL_TABLET | Freq: Every day | ORAL | 0 refills | Status: AC

## 2017-02-08 NOTE — Progress Notes (Signed)
  Echocardiogram 2D Echocardiogram has been performed.  Matthew Kline [name] [name] 02/08/2017, 4:12 PM

## 2017-02-08 NOTE — Progress Notes (Signed)
[facility] Progress Note   Subjective:  HD last night. Tolerated without issues. Net UF 2L Feeling good today. No c/os For D/C to [facility] SNF   Objective Vitals:   02/08/17 0055 02/08/17 0106 02/08/17 0513 02/08/17 0830  BP: (!) 152/84 (!) 146/72 (!) 144/79 (!) 148/79  Pulse: 75 88 81 82  Resp: 16 20 19 18   Temp: 98 F (36.7 C) 98.6 F (37 C) 98.6 F (37 C) 98.5 F (36.9 C)  TempSrc: Oral Oral Oral Oral  SpO2: 100% 100% 100% 100%  Weight:  67.5 kg (148 lb 13 oz)     Physical Exam General: WNWD male NAD  Heart: RRR Lungs: CTAB Abdomen: soft NT +BS Extremities: L AKA wrapped no R LE edema  Dialysis Access: LUE AVF +bruit    Additional Objective  Recent Labs  Lab 02/02/17 0700 02/04/17 0702 02/07/17 2033  NA 134* 134* 126*  K 4.8 4.0 4.1  CL 94* 93* 87*  CO2 24 26 24   GLUCOSE 108* 93 111*  BUN 52* 49* 70*  CREATININE 7.93* 5.67* 7.14*  CALCIUM 9.3 9.6 9.0  PHOS 5.7* 4.8* 4.1    Recent Labs  Lab 02/02/17 0700 02/04/17 0652 02/07/17 2032  WBC 9.7 8.6 7.5  HGB 9.0* 9.2* 8.5*  HCT 29.8* 30.4* 26.9*  MCV 89.8 89.9 89.1  PLT 235 220 226       Component Value Date/Time   SDES BLOOD LEFT HAND 01/30/2017 1209   SPECREQUEST IN PEDIATRIC BOTTLE Blood Culture adequate volume 01/30/2017 1209   CULT NO GROWTH 5 DAYS 01/30/2017 1209   REPTSTATUS 02/04/2017 FINAL 01/30/2017 1209    Recent Labs  Lab 02/01/17 1132 02/01/17 2054 02/02/17 1023 02/03/17 2204 02/04/17 1853  GLUCAP 133* 117* 88 115* 104*    Lab Results  Component Value Date   INR 1.30 11/08/2016   INR 1.2 08/17/2016   INR 1.15 10/07/2014   Medications: . sodium chloride     .  stroke: mapping our early stages of recovery book   Does not apply Once  . aspirin EC  162 mg Oral Daily  . atorvastatin  80 mg Oral q1800  . carvedilol  12.5 mg Oral BID WC  . clopidogrel  75 mg Oral q1800  . darbepoetin (ARANESP) injection - DIALYSIS  150 mcg Intravenous Q Fri-HD  .  feeding supplement (NEPRO CARB STEADY)  237 mL Oral BID BM  . feeding supplement (PRO-STAT SUGAR FREE 64)  30 mL Oral BID  . ferric citrate  420 mg Oral TID WC  . multivitamin  1 tablet Oral QHS  . sodium chloride flush  3 mL Intravenous Q12H   Dialysis Orders:  MWF NW 4h  400/800  70kg Will have lower EDW at discharge 2/2 bath  AVF  Hep 4000 -Mircera 271mcg IV q 2 weeks (last given during [facility] admit) - Venofer 100mg  x 5 ordered (3 given) - Hectoral 42mcg IV q HD - BMM: Auryxia 1/meals   Assessment/Plan: 1. AMS?acute metabolic encephalopathy-no + cultures; ATB's stopped. EEG non focal. Seems back to baseline MS. Further w/u per neuro.  2. Acute left hippocampal stroke - MRI 1/27 showed sub cm acute infarct in the left hippocampus . neuro is evaluating. Echo pending.  3. ESRD-MWF HD -  start on 3 K bath pending labs Will have lower EDW at discharge 4. Anemiaof CKD-Hgb 8.5,follow.Aranesp 164mcg qwk- resumed 1/25 5. Secondary hyperparathyroidism- Cor Ca ^, Hectorol stopped. Pbetter 6. Nutrition- Renal diet w/ fluid restriction.losing  weight - add renavite; has prostat ordered 7. Recent LAKA- 01/14/17 Dr. Sharol Given  8. CAD- per primary 9. Chronic jerking of extremities - chronic/ intermittent, per primary- no documented neurontin/pain meds in the past 7 days; had one dose ativan 1/19 10. Failure to thrive - progressive decline in health over past 2-3 years.  11. Dispo -For D/C to [place] [facility]   [place] PA-C [facility] Pager [phone] 02/08/2017,10:57 AM

## 2017-02-08 NOTE — Progress Notes (Signed)
HD tx completed @ 0015  w/o problem, delay in pulling needles d/t other issue in unit at the time, UF goal met, blood rinsed back, VSS, attempted to call report but primary nurse indisposed at the time and secretary said she would have her call me back, made them aware the pt was already on the way by transport and they acknowledged they saw him coming down the hall.

## 2017-02-08 NOTE — Progress Notes (Signed)
Report given to Wendie Simmer at [facility].

## 2017-02-08 NOTE — Social Work (Signed)
Discharge Note  Patient is medically stable for discharge back to [facility]. Mrs. Matthew Kline [phone]) contacted and informed of discharge. Non emergency ambulance will be  arranged for transport to facility. CSW intern signing off as no other social work Sport and exercise psychologist needed.  Massie Kluver,  [place] intern

## 2017-02-08 NOTE — Discharge Summary (Signed)
Physician Discharge Summary  [name] MRN: [id number] DOB/AGE: 58-Mar-1962 58 y.o.  Admit date: 01/29/2017 Discharge date: 02/08/2017  Admission Diagnoses:  Discharge Diagnoses:  Principal Problem:   Likely acute combined toxic and metabolic encephalopathy/Altered mental status   Enteritis   Possible incompletely treated UTI   Cerebral thrombosis with cerebral infarction   Cerebral embolism with cerebral infarctionRecent above knee amputation of left lower extremity   Pyuria   Essential hypertension   Anemia of chronic disease   Hyperkalemia   Diabetes mellitus with renal manifestations, controlled (HCC)   End-stage renal disease on hemodialysis (HCC)   CAD, multiple vessel   Above knee amputation status, left [facility])       Discharged Condition: stable  Hospital Course: Patient is a 57 year old male with past medical history significant for chronic osteomyelitis status post recent left AKA (beginning of January, 2019), ESRD on hemodialysis, chronic anemia.  Patient was discharged from this hospital on January 20, 2007 by orthopedics following above-knee amputation of the left lower extremity due to chronic osteomyelitis.  Patient was discharged on a 10-day course of Keflex, as the wound culture done prior to discharge grew MSSA.  The patient was admitted with altered mentis status, pyuria, GI symptoms that included nausea, vomiting, diarrhea and abdominal pain.  Patient was said to have fallen at the facility as well.  Patient was admitted for further assessment and management.  Urinalysis done on admission revealed pyuria.  Urine culture did not grow any organisms.  However, the patient has been on Keflex for about 9 days prior to the urine culture.  No other significant cultures noted.  Patient was still treated with antibiotics.  MRI of the brain done revealed acute left hippocampal infarct.  MRA of the brain, MRA of the neck have been non-revealing.    Neurology  team was consulted to assist with management of the acute infarct.  Neurology team advised continuing aspirin and Plavix.  The infarct was stated to be small.  Patient was also managed supportively.  Hemodialysis was discontinued during the hospital stay.  GI symptoms have resolved.  Patient's altered mentation has resolved.  The patient has been optimized, will be discharged back to the skilled nursing facility.      Consults: nephrology and neurology  Significant Diagnostic Studies: radiology: MRI: Head revealed a acute left hippocampal infarct.  MRA of the brain and neck were both non-revealing.  Urinalysis revealed pyuria, but urine culture did not grow any organisms.  Treatments: See above.  Discharge Exam: Blood pressure (!) 148/79, pulse 82, temperature 98.5 F (36.9 C), temperature source Oral, resp. rate 18, weight 67.5 kg (148 lb 13 oz), SpO2 100 %.   Disposition: 03-Skilled Nursing Facility  Discharge Instructions    Call MD for:   Complete by:  As directed    Call MD for worsening symptoms   Diet - low sodium heart healthy   Complete by:  As directed    Increase activity slowly   Complete by:  As directed      Allergies as of 02/08/2017      Reactions   Morphine And Related Other (See Comments)   hallucinations   Tygacil [tigecycline] Nausea And Vomiting, Other (See Comments)   Makes him feel crazy      Medication List    STOP taking these medications   baclofen 10 MG tablet Commonly known as:  LIORESAL   cephALEXin 250 MG capsule Commonly known as:  KEFLEX   oxyCODONE-acetaminophen 5-325  MG tablet Commonly known as:  PERCOCET   RENVELA 800 MG tablet Generic drug:  sevelamer carbonate   temazepam 30 MG capsule Commonly known as:  RESTORIL     TAKE these medications   amLODipine 5 MG tablet Commonly known as:  NORVASC Take 5 mg by mouth daily.   aspirin 81 MG EC tablet Take 2 tablets (162 mg total) by mouth daily.   atorvastatin 40 MG  tablet Commonly known as:  LIPITOR Take 40 mg by mouth at bedtime.   carvedilol 12.5 MG tablet Commonly known as:  COREG Take 1 tablet (12.5 mg total) by mouth 2 (two) times daily with a meal. What changed:    medication strength  how much to take  when to take this   clopidogrel 75 MG tablet Commonly known as:  PLAVIX Take 1 tablet (75 mg total) by mouth daily.   Darbepoetin Alfa 150 MCG/0.3ML Sosy injection Commonly known as:  ARANESP Inject 0.3 mLs (150 mcg total) into the vein every Friday with hemodialysis. Start taking on:  02/11/2017   feeding supplement (NEPRO CARB STEADY) Liqd Take 237 mLs by mouth 2 (two) times daily between meals.   feeding supplement (PRO-STAT SUGAR FREE 64) Liqd Take 30 mLs by mouth 2 (two) times daily.   ferric citrate 1 GM 210 MG(Fe) tablet Commonly known as:  AURYXIA Take 2 tablets (420 mg total) by mouth 3 (three) times daily with meals.   gabapentin 300 MG capsule Commonly known as:  NEURONTIN TAKE ONE CAPSULE (300 MG) BY MOUTH DAILY AT BEDTIME What changed:    how much to take  how to take this  when to take this  additional instructions   montelukast 10 MG tablet Commonly known as:  SINGULAIR TAKE 1 TABLET (10 MG TOTAL) BY MOUTH AT BEDTIME.   multivitamin Tabs tablet Take 1 tablet by mouth at bedtime.      Follow-up Information    Dennie Bible, NP. Schedule an appointment as soon as possible for a visit in 6 week(s).   Specialty:  Family Medicine Contact information: [address] [place] [address] [phone]           Signed: Bonnell Public 02/08/2017, 11:32 AM

## 2017-02-10 LAB — FUNGUS CULTURE WITH STAIN

## 2017-02-10 LAB — FUNGAL ORGANISM REFLEX

## 2017-02-10 LAB — FUNGUS CULTURE RESULT

## 2017-02-14 NOTE — Telephone Encounter (Signed)
Error..Matthew Kline °

## 2017-02-18 ENCOUNTER — Ambulatory Visit (INDEPENDENT_AMBULATORY_CARE_PROVIDER_SITE_OTHER): Payer: Medicare HMO | Admitting: Family

## 2017-02-18 ENCOUNTER — Encounter (INDEPENDENT_AMBULATORY_CARE_PROVIDER_SITE_OTHER): Payer: Self-pay | Admitting: Family

## 2017-02-18 VITALS — Ht 71.0 in | Wt 148.0 lb

## 2017-02-18 DIAGNOSIS — IMO0002 Reserved for concepts with insufficient information to code with codable children: Secondary | ICD-10-CM

## 2017-02-18 DIAGNOSIS — Z89612 Acquired absence of left leg above knee: Secondary | ICD-10-CM

## 2017-02-18 NOTE — Progress Notes (Signed)
Post-Op Visit Note   Patient: Matthew Kline           Date of Birth: 05-01-1960           MRN: [id number] Visit Date: 02/18/2017 PCP: Sandi Mariscal, MD  Chief Complaint:  Chief Complaint  Patient presents with  . Left Leg - Routine Post Op    01/14/17 left AKA    HPI:  HPI The patient is a 57 year old gentleman seen today status post left above knee amputation on 01/14/17. Residing at skilled nursing at this time. Request [facility] for prosthesis needs.  Ortho Exam Incision healing well has a small hematoma centrally is 10 mm in diameter. Does not probe. No some bloody drainage. No surrounding erythema, odor. No sign of infection. Sutures and staples harvested.  Visit Diagnoses:  1. Above knee amputation status, left ([place])     Plan: continue daily incisional care. Begin wearing shrinker once obtained. Have provided order for prosthesis today.  Follow-Up Instructions: Return in about 4 weeks (around 03/18/2017).   Imaging: No results found.  Orders:  No orders of the defined types were placed in this encounter.  No orders of the defined types were placed in this encounter.    PMFS History: Patient Active Problem List   Diagnosis Date Noted  . Altered mental status   . Cerebral thrombosis with cerebral infarction 02/06/2017  . Cerebral embolism with cerebral infarction 02/06/2017  . Above knee amputation status, left ([place])   . Pressure injury of skin 01/30/2017  . Acute lower UTI 01/29/2017  . Acute metabolic encephalopathy 40/98/1191  . UTI (urinary tract infection) 01/29/2017  . Quadriceps muscle rupture, left, initial encounter   . Fall 01/09/2017  . Gait disturbance 01/09/2017  . Staphylococcus aureus infection 01/09/2017  . Infection of prosthetic left knee joint ([place]) 01/09/2017  . Fever, unknown origin 11/09/2016  . Critical lower limb ischemia 08/25/2016  . Ulcer of left midfoot with fat layer exposed ([place]) 08/13/2016  . Diabetic ulcer of left midfoot  associated with type 2 diabetes mellitus, with fat layer exposed ([place]) 07/25/2016  . Peripheral neuropathy 07/22/2016  . Tobacco abuse 07/22/2016  . CAD in native artery 05/18/2016  . CAD, multiple vessel 05/11/2016  . Positive cardiac stress test 05/11/2016  . Abnormal stress test 04/30/2016  . Pre-transplant evaluation for kidney transplant 04/30/2016  . S/P revision of total knee 11/26/2015  . Pain in the chest   . Acute on chronic diastolic heart failure ([place]) 07/05/2015  . Volume overload 07/04/2015  . Shortness of breath 07/04/2015  . Hypoxemia 07/04/2015  . Elevated troponin   . End-stage renal disease on hemodialysis ([place])   . Hypervolemia   . 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 Pyogenic bacterial arthritis of knee, left ([place]) 08/07/2014  . Tachycardia 07/24/2014  . Acute upper respiratory infection 07/24/2014  . ESRD on dialysis ([place]) 07/14/2014  . Type II diabetes mellitus ([place]) 07/14/2014  . Anemia in chronic kidney disease 07/14/2014  . Congestive heart failure (CHF) ([place]) 07/13/2014  . Surgical wound dehiscence 05/09/2014  . Dehiscence of closure of skin 05/09/2014  . Total knee replacement status 04/10/2014  . Diabetes mellitus with renal manifestations, controlled ([place]) 10/24/2013  . Hypertensive renal disease 06/27/2013  . DM type 2 causing vascular disease ([place]) 06/27/2013  . Erectile dysfunction 06/27/2013  . Depression 06/27/2013  . Claudication of left lower extremity ([place]) 12/19/2012  . Essential hypertension, benign 12/19/2012  . Sinusitis, acute maxillary 11/22/2012  .  Otitis, externa, infective 11/14/2012  . Leg edema, left 11/14/2012  . End stage renal disease ([place]) 10/02/2012  . Controlled type 2 DM with proteinuria or microalbuminuria 09/19/2012  . GERD (gastroesophageal reflux disease) 09/19/2012  . Leukocytosis 09/19/2012  . Lacunar infarction 08/17/2012  . Polymyalgia rheumatica ([place]) 08/17/2012  . 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 Essential hypertension 05/10/2012  . Vitamin D deficiency 05/10/2012  . Diabetes mellitus due to underlying condition ([place]) 05/10/2012  . Hyperlipidemia LDL goal <100 05/10/2012  . Anemia of chronic disease 05/10/2012  . Screening for prostate cancer 05/10/2012  . Chronic kidney disease (CKD), stage IV (severe) ([place]) 05/10/2012  . Peripheral autonomic neuropathy due to DM ([place]) 05/10/2012  . Callus of foot 05/10/2012  . Urgency of urination 05/10/2012  . Hyperkalemia 05/10/2012  . Candidiasis of the esophagus 10/12/2011  . Internal hemorrhoids without mention of complication 14/43/1540  . Pre-syncope 07/25/2009  . DJD (degenerative joint disease) of cervical spine 02/17/2009   Past Medical History:  Diagnosis Date  . Anemia, unspecified   . Anxiety   . Arthralgia 2010   polyarticular  . Arthritis    "back, knees" (01/10/2017)  . Cancer [facility])    "kidney area" (01/10/2017)  . CHF (congestive heart failure) ([place]) 07/25/2009   denies  . Chronic lower back pain   . Coronary artery disease   . Coughing    pt. reports that he has drainage from sinus infection  . Diabetic foot ulcer ([place])   . Diabetic neuropathy ([place])   . ESRD (end stage renal disease) on dialysis [facility])    started 12/2012; "MWF; [facility] "  (01/10/2017)  . GERD (gastroesophageal reflux disease)    hx "before I lost weight", no problem 9 years  . Hemodialysis access site with mature fistula ([place])   . Hemorrhoids, internal 10/2011   small  . High cholesterol   . History of blood transfusion    "related to the anemia"  . Hypertension   . Insomnia, unspecified   . 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 Long term (current) use of anticoagulants   . Myocardial infarction ([place]) 1995  . Orthostatic hypotension   . Osteomyelitis of foot, left, acute ([place])   . Other chronic postoperative pain   . Pneumonia    "probably twice" (01/10/2017)  . Polymyalgia rheumatica ([place])   . Renal  insufficiency   . Sleep apnea    "lost weight; no more problem" (01/10/2017)  . Stroke ([place]) 01/10/06   denies residual on 05/09/2014  . Type II diabetes mellitus ([place]) dx'd 1995  . Unspecified hereditary and idiopathic peripheral neuropathy    feet  . Unspecified osteomyelitis, site unspecified   . Unspecified vitamin D deficiency     Family History  Problem Relation Age of Onset  . Hypertension Mother   . Cancer Mother 47       Ovarian  . Heart disease Maternal Aunt   . Stroke Maternal Grandfather     Past Surgical History:  Procedure Laterality Date  . ABDOMINAL AORTOGRAM N/A 08/25/2016   Procedure: ABDOMINAL AORTOGRAM;  Surgeon: Wellington Hampshire, MD;  Location: [place];  Service: Cardiovascular;  Laterality: N/A;  . AMPUTATION  01/21/2012   Procedure: AMPUTATION RAY;  Surgeon: [name], MD;  Location: [place];  Service: Orthopedics;  Laterality: Left;  Left Foot 4th Ray Amputation  . AMPUTATION Left 05/04/2013   Procedure: AMPUTATION DIGIT;  Surgeon: [name], MD;  Location: [place];  Service: Orthopedics;  Laterality: Left;  Left Great Toe Amputation at [facility]  . AMPUTATION Left 01/14/2017   Procedure: AMPUTATION ABOVE LEFT KNEE;  Surgeon: [name], MD;  Location: [facility];  Service: Orthopedics;  Laterality: Left;  . ANTERIOR CERVICAL DECOMP/DISCECTOMY FUSION  02/2011  . BACK SURGERY    . BASCILIC VEIN TRANSPOSITION Left 10/19/2012   Procedure: BASCILIC VEIN TRANSPOSITION;  Surgeon: Serafina Mitchell, MD;  Location: [facility] [facility];  Service: Vascular;  Laterality: Left;  . CARDIAC CATHETERIZATION     "before bypass"  . CORONARY ARTERY BYPASS GRAFT     x 5 with lima at [place] WITH ANTIBIOTIC SPACERS Left 08/07/2014   Procedure: Replace Left Total Knee Arthroplasty,  Place Antibiotic Spacer;  Surgeon: [name], MD;  Location: [place];  Service: Orthopedics;  Laterality: Left;  . I&D EXTREMITY Left 05/09/2014   Procedure:  Irrigation and Debridement Left Knee and Closure of Total Knee Arthroplasty Incision;  Surgeon: [name], MD;  Location: [facility];  Service: Orthopedics;  Laterality: Left;  . I&D KNEE WITH POLY EXCHANGE Left 05/31/2014   Procedure: IRRIGATION AND DEBRIDEMENT LEFT KNEE, PLACE ANTIBIOTIC BEADS,  POLY EXCHANGE;  Surgeon: [name], MD;  Location: [place];  Service: Orthopedics;  Laterality: Left;  . IRRIGATION AND DEBRIDEMENT KNEE Left 01/12/2017   Procedure: IRRIGATION AND DEBRIDEMENT LEFT KNEE;  Surgeon: [name], MD;  Location: [place];  Service: Orthopedics;  Laterality: Left;  . JOINT REPLACEMENT    . KNEE ARTHROSCOPY Left 08-25-2012  . LOWER EXTREMITY ANGIOGRAPHY Left 08/25/2016   Procedure: Lower Extremity Angiography;  Surgeon: Wellington Hampshire, MD;  Location: [place] [facility];  Service: Cardiovascular;  Laterality: Left;  . PERIPHERAL VASCULAR BALLOON ANGIOPLASTY Left 08/25/2016   Procedure: PERIPHERAL VASCULAR BALLOON ANGIOPLASTY;  Surgeon: Wellington Hampshire, MD;  Location: [place] [facility];  Service: Cardiovascular;  Laterality: Left;  lt peroneal and ant tibial arteries cutting balloon  . REFRACTIVE SURGERY Bilateral   . TOE AMPUTATION Bilateral    "I've lost 7 toes over the last 7 years" (05/09/2014)  . TOE SURGERY Left April 2015   Big toe removed on left foot.  . TONSILLECTOMY    . TOTAL KNEE ARTHROPLASTY Left 04/10/2014   Procedure: TOTAL KNEE ARTHROPLASTY;  Surgeon: [name], MD;  Location: [place];  Service: Orthopedics;  Laterality: Left;  . TOTAL KNEE REVISION Left 10/25/2014   Procedure: LEFT TOTAL KNEE REVISION;  Surgeon: [name], MD;  Location: [place];  Service: Orthopedics;  Laterality: Left;  . TOTAL KNEE REVISION Left 11/26/2015   Procedure: Removal Left Total Knee Arthroplasty, Hinged Total Knee Arthroplasty;  Surgeon: [name], MD;  Location: [place];  Service: Orthopedics;  Laterality: Left;  . UVULOPALATOPHARYNGOPLASTY, TONSILLECTOMY AND  SEPTOPLASTY  ~ 1989  . WOUND DEBRIDEMENT Left 05/09/2014   Dehiscence Left Total Knee Arthroplasty Incision   Social History   Occupational History  . Not on file  Tobacco Use  . Smoking status: Former Smoker    Packs/day: 0.12    Years: 32.00    Pack years: 3.84    Types: Cigarettes    Last attempt to quit: 10/05/2016    Years since quitting: 0.3  . Smokeless tobacco: Never Used  Substance and Sexual Activity  . Alcohol use: No    Alcohol/week: 0.0 oz  . Drug use: No  . Sexual activity: Not Currently

## 2017-03-11 ENCOUNTER — Telehealth (INDEPENDENT_AMBULATORY_CARE_PROVIDER_SITE_OTHER): Payer: Self-pay | Admitting: Orthopedic Surgery

## 2017-03-11 NOTE — Telephone Encounter (Signed)
[name], RN, from [facility] called requesting skilled nursing orders in the following:  1x a week for 1 week, started on Thursday, February 28th 2x a week for 2 week 1x a week for 2 week  This is monitor his incision line while he heals.  CB#[phone].  Thank you.

## 2017-03-11 NOTE — Telephone Encounter (Signed)
I called and lm on vm for verbal ok for orders below.

## 2017-03-15 ENCOUNTER — Telehealth (INDEPENDENT_AMBULATORY_CARE_PROVIDER_SITE_OTHER): Payer: Self-pay | Admitting: Orthopedic Surgery

## 2017-03-15 NOTE — Telephone Encounter (Signed)
Called and sw physical therapist to advise ok to orders elow. Pt does not have any s/s of infection will follow up in office on 03/22/17

## 2017-03-15 NOTE — Telephone Encounter (Signed)
Matthew Kline with [facility] would like to reduce visits to  1x for 3 weeks  [name] also wants to let Dr. Sharol Given know about 2 small scab areas on incision site. He said he doesn't want it to turn into anything.  CB # [phone]

## 2017-03-15 NOTE — Telephone Encounter (Signed)
Error

## 2017-03-22 ENCOUNTER — Encounter (INDEPENDENT_AMBULATORY_CARE_PROVIDER_SITE_OTHER): Payer: Self-pay | Admitting: Orthopedic Surgery

## 2017-03-22 ENCOUNTER — Ambulatory Visit (INDEPENDENT_AMBULATORY_CARE_PROVIDER_SITE_OTHER): Payer: Medicare HMO | Admitting: Orthopedic Surgery

## 2017-03-22 DIAGNOSIS — Z89612 Acquired absence of left leg above knee: Secondary | ICD-10-CM

## 2017-03-22 DIAGNOSIS — IMO0002 Reserved for concepts with insufficient information to code with codable children: Secondary | ICD-10-CM

## 2017-03-22 NOTE — Progress Notes (Signed)
Office Visit Note   Patient: Bronislaus Verdell           Date of Birth: 06-19-1960           MRN: [id number] Visit Date: 03/22/2017              Requested by: Sandi Mariscal, [place], [place] [id number] PCP: Sandi Mariscal, MD  Chief Complaint  Patient presents with  . Follow-up    Lt AKA 01/14/17      HPI: Patient is a 57 year old gentleman who presents approximately 2-1/2 months status post left above-the-knee amputation.  Patient's postoperative course was complicated by a stroke.  Patient states that he is completely resolved from his stroke symptoms that he has good strength patient states that he no longer has phantom pain due to using a mirror for confirmation.  Assessment & Plan: Visit Diagnoses:  1. Above knee amputation status, left [facility])     Plan: Patient is ready to be fit for an above-the-knee prosthesis.  Patient will need a microprocessor knee.  He has a K3 level ambulator patient will need to be able to walk at varying cadence and gait he will need to be able to ambulate independently up and down stairs and bleachers.  He will need to be able to move independently and confined space.  Patient has good strength ability and desire to use the microprocessor knee.  We will begin the process today prescription was provided for [facility] and patient will follow up with [facility] for gait training.  Follow-Up Instructions: Return in about 2 months (around 05/22/2017).   Ortho Exam  Patient is alert, oriented, no adenopathy, well-dressed, normal affect, normal respiratory effort. Examination of the residual limb is well-healed and well consolidated there are no ulcers no drainage no open wounds no signs of infection.  Patient is currently ambulating in a wheelchair he is able to get himself from a sitting to a standing position without problems.  Imaging: No results found. No images are attached to the encounter.  Labs: Lab Results  Component Value Date   HGBA1C  5.7 (H) 02/07/2017   HGBA1C 6.4 (H) 11/08/2016   HGBA1C 6.6 (H) 11/24/2015   ESRSEDRATE 80 (H) 11/29/2015   ESRSEDRATE 75 (H) 08/07/2014   ESRSEDRATE >140 (H) 06/03/2014   CRP 9.8 (H) 07/14/2016   CRP 20.4 (H) 11/29/2015   CRP 5.6 (H) 08/07/2014   REPTSTATUS 02/04/2017 FINAL 01/30/2017   GRAMSTAIN  01/12/2017    RARE WBC PRESENT, PREDOMINANTLY MONONUCLEAR FEW GRAM POSITIVE COCCI IN PAIRS    CULT NO GROWTH 5 DAYS 01/30/2017   LABORGA STAPHYLOCOCCUS AUREUS 01/12/2017    @LABSALLVALUES (HGBA1)@  There is no height or weight on file to calculate BMI.  Orders:  No orders of the defined types were placed in this encounter.  No orders of the defined types were placed in this encounter.    Procedures: No procedures performed  Clinical Data: No additional findings.  ROS:  All other systems negative, except as noted in the HPI. Review of Systems  Objective: Vital Signs: There were no vitals taken for this visit.  Specialty Comments:  No specialty comments available.  PMFS History: Patient Active Problem List   Diagnosis Date Noted  . Altered mental status   . Cerebral thrombosis with cerebral infarction 02/06/2017  . Cerebral embolism with cerebral infarction 02/06/2017  . Above knee amputation status, left ([place])   . Pressure injury of skin 01/30/2017  . Acute lower UTI 01/29/2017  .  Acute metabolic encephalopathy 37/90/2409  . UTI (urinary tract infection) 01/29/2017  . Quadriceps muscle rupture, left, initial encounter   . Fall 01/09/2017  . Gait disturbance 01/09/2017  . Staphylococcus aureus infection 01/09/2017  . Infection of prosthetic left knee joint ([place]) 01/09/2017  . Fever, unknown origin 11/09/2016  . Critical lower limb ischemia 08/25/2016  . Ulcer of left midfoot with fat layer exposed ([place]) 08/13/2016  . Diabetic ulcer of left midfoot associated with type 2 diabetes mellitus, with fat layer exposed ([place]) 07/25/2016  . Peripheral neuropathy  07/22/2016  . Tobacco abuse 07/22/2016  . CAD in native artery 05/18/2016  . CAD, multiple vessel 05/11/2016  . Positive cardiac stress test 05/11/2016  . Abnormal stress test 04/30/2016  . Pre-transplant evaluation for kidney transplant 04/30/2016  . S/P revision of total knee 11/26/2015  . Pain in the chest   . Acute on chronic diastolic heart failure ([place]) 07/05/2015  . Volume overload 07/04/2015  . Shortness of breath 07/04/2015  . Hypoxemia 07/04/2015  . Elevated troponin   . End-stage renal disease on hemodialysis ([place])   . Hypervolemia   . 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 Pyogenic bacterial arthritis of knee, left ([place]) 08/07/2014  . Tachycardia 07/24/2014  . Acute upper respiratory infection 07/24/2014  . ESRD on dialysis ([place]) 07/14/2014  . Type II diabetes mellitus ([place]) 07/14/2014  . Anemia in chronic kidney disease 07/14/2014  . Congestive heart failure (CHF) ([place]) 07/13/2014  . Surgical wound dehiscence 05/09/2014  . Dehiscence of closure of skin 05/09/2014  . Total knee replacement status 04/10/2014  . Diabetes mellitus with renal manifestations, controlled ([place]) 10/24/2013  . Hypertensive renal disease 06/27/2013  . DM type 2 causing vascular disease ([place]) 06/27/2013  . Erectile dysfunction 06/27/2013  . Depression 06/27/2013  . Claudication of left lower extremity ([place]) 12/19/2012  . Essential hypertension, benign 12/19/2012  . Sinusitis, acute maxillary 11/22/2012  . Otitis, externa, infective 11/14/2012  . Leg edema, left 11/14/2012  . End stage renal disease ([place]) 10/02/2012  . Controlled type 2 DM with proteinuria or microalbuminuria 09/19/2012  . GERD (gastroesophageal reflux disease) 09/19/2012  . Leukocytosis 09/19/2012  . Lacunar infarction 08/17/2012  . Polymyalgia rheumatica ([place]) 08/17/2012  . 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 Essential hypertension 05/10/2012  . Vitamin D deficiency 05/10/2012  . Diabetes mellitus due to  underlying condition ([place]) 05/10/2012  . Hyperlipidemia LDL goal <100 05/10/2012  . Anemia of chronic disease 05/10/2012  . Screening for prostate cancer 05/10/2012  . Chronic kidney disease (CKD), stage IV (severe) ([place]) 05/10/2012  . Peripheral autonomic neuropathy due to DM ([place]) 05/10/2012  . Callus of foot 05/10/2012  . Urgency of urination 05/10/2012  . Hyperkalemia 05/10/2012  . Candidiasis of the esophagus 10/12/2011  . Internal hemorrhoids without mention of complication 73/53/2992  . Pre-syncope 07/25/2009  . DJD (degenerative joint disease) of cervical spine 02/17/2009   Past Medical History:  Diagnosis Date  . Anemia, unspecified   . Anxiety   . Arthralgia 2010   polyarticular  . Arthritis    "back, knees" (01/10/2017)  . Cancer [facility])    "kidney area" (01/10/2017)  . CHF (congestive heart failure) ([place]) 07/25/2009   denies  . Chronic lower back pain   . Coronary artery disease   . Coughing    pt. reports that he has drainage from sinus infection  . Diabetic foot ulcer ([place])   . Diabetic neuropathy ([place])   . ESRD (end stage renal disease)  on dialysis [facility])    started 12/2012; "MWF; [facility] "  (01/10/2017)  . GERD (gastroesophageal reflux disease)    hx "before I lost weight", no problem 9 years  . Hemodialysis access site with mature fistula ([place])   . Hemorrhoids, internal 10/2011   small  . High cholesterol   . History of blood transfusion    "related to the anemia"  . Hypertension   . Insomnia, unspecified   . 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 Long term (current) use of anticoagulants   . Myocardial infarction ([place]) 1995  . Orthostatic hypotension   . Osteomyelitis of foot, left, acute ([place])   . Other chronic postoperative pain   . Pneumonia    "probably twice" (01/10/2017)  . Polymyalgia rheumatica ([place])   . Renal insufficiency   . Sleep apnea    "lost weight; no more problem" (01/10/2017)  . Stroke ([place]) 01/10/06   denies  residual on 05/09/2014  . Type II diabetes mellitus ([place]) dx'd 1995  . Unspecified hereditary and idiopathic peripheral neuropathy    feet  . Unspecified osteomyelitis, site unspecified   . Unspecified vitamin D deficiency     Family History  Problem Relation Age of Onset  . Hypertension Mother   . Cancer Mother 8       Ovarian  . Heart disease Maternal Aunt   . Stroke Maternal Grandfather     Past Surgical History:  Procedure Laterality Date  . ABDOMINAL AORTOGRAM N/A 08/25/2016   Procedure: ABDOMINAL AORTOGRAM;  Surgeon: Wellington Hampshire, MD;  Location: [place] [facility];  Service: Cardiovascular;  Laterality: N/A;  . AMPUTATION  01/21/2012   Procedure: AMPUTATION RAY;  Surgeon: [name], MD;  Location: [place];  Service: Orthopedics;  Laterality: Left;  Left Foot 4th Ray Amputation  . AMPUTATION Left 05/04/2013   Procedure: AMPUTATION DIGIT;  Surgeon: [name], MD;  Location: [place];  Service: Orthopedics;  Laterality: Left;  Left Great Toe Amputation at [facility]  . AMPUTATION Left 01/14/2017   Procedure: AMPUTATION ABOVE LEFT KNEE;  Surgeon: [name], MD;  Location: [place];  Service: Orthopedics;  Laterality: Left;  . ANTERIOR CERVICAL DECOMP/DISCECTOMY FUSION  02/2011  . BACK SURGERY    . BASCILIC VEIN TRANSPOSITION Left 10/19/2012   Procedure: BASCILIC VEIN TRANSPOSITION;  Surgeon: Serafina Mitchell, MD;  Location: [place];  Service: Vascular;  Laterality: Left;  . CARDIAC CATHETERIZATION     "before bypass"  . CORONARY ARTERY BYPASS GRAFT     x 5 with lima at [facility] WITH ANTIBIOTIC SPACERS Left 08/07/2014   Procedure: Replace Left Total Knee Arthroplasty,  Place Antibiotic Spacer;  Surgeon: [name], MD;  Location: [place] [place];  Service: Orthopedics;  Laterality: Left;  . I&D EXTREMITY Left 05/09/2014   Procedure: Irrigation and Debridement Left Knee and Closure of Total Knee Arthroplasty Incision;  Surgeon: [name], MD;   Location: [place];  Service: Orthopedics;  Laterality: Left;  . I&D KNEE WITH POLY EXCHANGE Left 05/31/2014   Procedure: IRRIGATION AND DEBRIDEMENT LEFT KNEE, PLACE ANTIBIOTIC BEADS,  POLY EXCHANGE;  Surgeon: [name], MD;  Location: [place];  Service: Orthopedics;  Laterality: Left;  . IRRIGATION AND DEBRIDEMENT KNEE Left 01/12/2017   Procedure: IRRIGATION AND DEBRIDEMENT LEFT KNEE;  Surgeon: [name], MD;  Location: [place];  Service: Orthopedics;  Laterality: Left;  . JOINT REPLACEMENT    .  KNEE ARTHROSCOPY Left 08-25-2012  . LOWER EXTREMITY ANGIOGRAPHY Left 08/25/2016   Procedure: Lower Extremity Angiography;  Surgeon: Wellington Hampshire, MD;  Location: [place] [facility];  Service: Cardiovascular;  Laterality: Left;  . PERIPHERAL VASCULAR BALLOON ANGIOPLASTY Left 08/25/2016   Procedure: PERIPHERAL VASCULAR BALLOON ANGIOPLASTY;  Surgeon: Wellington Hampshire, MD;  Location: [place] [facility];  Service: Cardiovascular;  Laterality: Left;  lt peroneal and ant tibial arteries cutting balloon  . REFRACTIVE SURGERY Bilateral   . TOE AMPUTATION Bilateral    "I've lost 7 toes over the last 7 years" (05/09/2014)  . TOE SURGERY Left April 2015   Big toe removed on left foot.  . TONSILLECTOMY    . TOTAL KNEE ARTHROPLASTY Left 04/10/2014   Procedure: TOTAL KNEE ARTHROPLASTY;  Surgeon: [name], MD;  Location: [place];  Service: Orthopedics;  Laterality: Left;  . TOTAL KNEE REVISION Left 10/25/2014   Procedure: LEFT TOTAL KNEE REVISION;  Surgeon: [name], MD;  Location: [place];  Service: Orthopedics;  Laterality: Left;  . TOTAL KNEE REVISION Left 11/26/2015   Procedure: Removal Left Total Knee Arthroplasty, Hinged Total Knee Arthroplasty;  Surgeon: [name], MD;  Location: [place];  Service: Orthopedics;  Laterality: Left;  . UVULOPALATOPHARYNGOPLASTY, TONSILLECTOMY AND SEPTOPLASTY  ~ 1989  . WOUND DEBRIDEMENT Left 05/09/2014   Dehiscence Left Total Knee Arthroplasty Incision   Social  History   Occupational History  . Not on file  Tobacco Use  . Smoking status: Former Smoker    Packs/day: 0.12    Years: 32.00    Pack years: 3.84    Types: Cigarettes    Last attempt to quit: 10/05/2016    Years since quitting: 0.4  . Smokeless tobacco: Never Used  Substance and Sexual Activity  . Alcohol use: No    Alcohol/week: 0.0 oz  . Drug use: No  . Sexual activity: Not Currently

## 2017-04-04 ENCOUNTER — Emergency Department (HOSPITAL_COMMUNITY): Payer: Medicare HMO

## 2017-04-04 ENCOUNTER — Emergency Department (HOSPITAL_COMMUNITY)
Admission: EM | Admit: 2017-04-04 | Discharge: 2017-04-04 | Disposition: A | Discharge status: Home / Self Care | Payer: Medicare HMO | Attending: Emergency Medicine | Admitting: Emergency Medicine

## 2017-04-04 ENCOUNTER — Encounter (HOSPITAL_COMMUNITY): Payer: Self-pay | Admitting: Emergency Medicine

## 2017-04-04 DIAGNOSIS — I2510 Atherosclerotic heart disease of native coronary artery without angina pectoris: Secondary | ICD-10-CM | POA: Insufficient documentation

## 2017-04-04 DIAGNOSIS — Z8673 Personal history of transient ischemic attack (TIA), and cerebral infarction without residual deficits: Secondary | ICD-10-CM | POA: Diagnosis not present

## 2017-04-04 DIAGNOSIS — Z96652 Presence of left artificial knee joint: Secondary | ICD-10-CM | POA: Insufficient documentation

## 2017-04-04 DIAGNOSIS — Z7902 Long term (current) use of antithrombotics/antiplatelets: Secondary | ICD-10-CM | POA: Diagnosis not present

## 2017-04-04 DIAGNOSIS — N186 End stage renal disease: Secondary | ICD-10-CM | POA: Insufficient documentation

## 2017-04-04 DIAGNOSIS — Z7982 Long term (current) use of aspirin: Secondary | ICD-10-CM | POA: Diagnosis not present

## 2017-04-04 DIAGNOSIS — Z87891 Personal history of nicotine dependence: Secondary | ICD-10-CM | POA: Diagnosis not present

## 2017-04-04 DIAGNOSIS — I5033 Acute on chronic diastolic (congestive) heart failure: Secondary | ICD-10-CM | POA: Insufficient documentation

## 2017-04-04 DIAGNOSIS — E1122 Type 2 diabetes mellitus with diabetic chronic kidney disease: Secondary | ICD-10-CM | POA: Diagnosis not present

## 2017-04-04 DIAGNOSIS — I132 Hypertensive heart and chronic kidney disease with heart failure and with stage 5 chronic kidney disease, or end stage renal disease: Secondary | ICD-10-CM | POA: Diagnosis not present

## 2017-04-04 DIAGNOSIS — R059 Cough, unspecified: Secondary | ICD-10-CM

## 2017-04-04 DIAGNOSIS — Z992 Dependence on renal dialysis: Secondary | ICD-10-CM | POA: Insufficient documentation

## 2017-04-04 DIAGNOSIS — I252 Old myocardial infarction: Secondary | ICD-10-CM | POA: Diagnosis not present

## 2017-04-04 DIAGNOSIS — R05 Cough: Secondary | ICD-10-CM | POA: Diagnosis not present

## 2017-04-04 DIAGNOSIS — E1140 Type 2 diabetes mellitus with diabetic neuropathy, unspecified: Secondary | ICD-10-CM | POA: Diagnosis not present

## 2017-04-04 LAB — COMPREHENSIVE METABOLIC PANEL WITH GFR
ALT: 23 U/L (ref 17–63)
AST: 22 U/L (ref 15–41)
Albumin: 3.5 g/dL (ref 3.5–5.0)
Alkaline Phosphatase: 99 U/L (ref 38–126)
Anion gap: 24 — ABNORMAL HIGH (ref 5–15)
BUN: 60 mg/dL — ABNORMAL HIGH (ref 6–20)
CO2: 20 mmol/L — ABNORMAL LOW (ref 22–32)
Calcium: 9.3 mg/dL (ref 8.9–10.3)
Chloride: 92 mmol/L — ABNORMAL LOW (ref 101–111)
Creatinine, Ser: 10.1 mg/dL — ABNORMAL HIGH (ref 0.61–1.24)
GFR calc Af Amer: 6 mL/min — ABNORMAL LOW
GFR calc non Af Amer: 5 mL/min — ABNORMAL LOW
Glucose, Bld: 95 mg/dL (ref 65–99)
Potassium: 5.1 mmol/L (ref 3.5–5.1)
Sodium: 136 mmol/L (ref 135–145)
Total Bilirubin: 0.7 mg/dL (ref 0.3–1.2)
Total Protein: 8.6 g/dL — ABNORMAL HIGH (ref 6.5–8.1)

## 2017-04-04 LAB — CBC WITH DIFFERENTIAL/PLATELET
Basophils Absolute: 0 10*3/uL (ref 0.0–0.1)
Basophils Relative: 0 %
Eosinophils Absolute: 0 10*3/uL (ref 0.0–0.7)
Eosinophils Relative: 1 %
HEMATOCRIT: 43.5 % (ref 39.0–52.0)
HEMOGLOBIN: 13.8 g/dL (ref 13.0–17.0)
Lymphocytes Relative: 39 %
Lymphs Abs: 1.8 10*3/uL (ref 0.7–4.0)
MCH: 29.1 pg (ref 26.0–34.0)
MCHC: 31.7 g/dL (ref 30.0–36.0)
MCV: 91.6 fL (ref 78.0–100.0)
MONOS PCT: 10 %
Monocytes Absolute: 0.5 10*3/uL (ref 0.1–1.0)
NEUTROS ABS: 2.3 10*3/uL (ref 1.7–7.7)
NEUTROS PCT: 50 %
Platelets: 149 10*3/uL — ABNORMAL LOW (ref 150–400)
RBC: 4.75 MIL/uL (ref 4.22–5.81)
RDW: 18 % — ABNORMAL HIGH (ref 11.5–15.5)
WBC: 4.6 10*3/uL (ref 4.0–10.5)

## 2017-04-04 MED ORDER — BENZONATATE 100 MG PO CAPS: 100.0000 mg | ORAL_CAPSULE | Freq: Three times a day (TID) | ORAL | 0 refills | Status: DC

## 2017-04-04 NOTE — Discharge Instructions (Signed)
Please read attached information. If you experience any new or worsening signs or symptoms please return to the emergency room for evaluation. Please follow-up with your primary care provider or specialist as discussed. Please use medication prescribed only as directed and discontinue taking if you have any concerning signs or symptoms.   °

## 2017-04-04 NOTE — ED Triage Notes (Signed)
Pt to ER for evaluation of nasal congestion and productive cough 1-2 days. Reports sputum is dark yellow. Pt in NAD.

## 2017-04-04 NOTE — ED Provider Notes (Addendum)
[facility] EMERGENCY DEPARTMENT Provider Note   CSN: [id number] Arrival date & time: 04/04/17  1122     History   Chief Complaint Chief Complaint  Patient presents with  . Cough    HPI Ardian Haberland is a 57 y.o. male.  HPI   57 year old male presents today with cough.  Patient reports that he is a past medical history of end-stage renal disease, CHF, smoker.  He notes that he woke up this morning with productive cough, he denies any shortness of breath fever, chest pain.  Patient notes he went to his scheduled dialysis today and was told he should follow-up for chest x-ray.  Patient denies any significant fluid overload, notes that he has had some diarrhea recently with no sedation abdominal pain.  Past Medical History:  Diagnosis Date  . Anemia, unspecified   . Anxiety   . Arthralgia 2010   polyarticular  . Arthritis    "back, knees" (01/10/2017)  . Cancer [facility])    "kidney area" (01/10/2017)  . CHF (congestive heart failure) ([place]) 07/25/2009   denies  . Chronic lower back pain   . Coronary artery disease   . Coughing    pt. reports that he has drainage from sinus infection  . Diabetic foot ulcer ([place])   . Diabetic neuropathy ([place])   . ESRD (end stage renal disease) on dialysis [facility])    started 12/2012; "MWF; [facility] "  (01/10/2017)  . GERD (gastroesophageal reflux disease)    hx "before I lost weight", no problem 9 years  . Hemodialysis access site with mature fistula ([place])   . Hemorrhoids, internal 10/2011   small  . High cholesterol   . History of blood transfusion    "related to the anemia"  . Hypertension   . Insomnia, unspecified   . 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 Long term (current) use of anticoagulants   . Myocardial infarction ([place]) 1995  . Orthostatic hypotension   . Osteomyelitis of foot, left, acute ([place])   . Other chronic postoperative pain   . Pneumonia    "probably twice" (01/10/2017)  .  Polymyalgia rheumatica ([place])   . Renal insufficiency   . Sleep apnea    "lost weight; no more problem" (01/10/2017)  . Stroke ([place]) 01/10/06   denies residual on 05/09/2014  . Type II diabetes mellitus ([place]) dx'd 1995  . Unspecified hereditary and idiopathic peripheral neuropathy    feet  . Unspecified osteomyelitis, site unspecified   . Unspecified vitamin D deficiency     Patient Active Problem List   Diagnosis Date Noted  . Altered mental status   . Cerebral thrombosis with cerebral infarction 02/06/2017  . Cerebral embolism with cerebral infarction 02/06/2017  . Above knee amputation status, left ([place])   . Pressure injury of skin 01/30/2017  . Acute lower UTI 01/29/2017  . Acute metabolic encephalopathy 08/65/7846  . UTI (urinary tract infection) 01/29/2017  . Quadriceps muscle rupture, left, initial encounter   . Fall 01/09/2017  . Gait disturbance 01/09/2017  . Staphylococcus aureus infection 01/09/2017  . Infection of prosthetic left knee joint ([place]) 01/09/2017  . Fever, unknown origin 11/09/2016  . Critical lower limb ischemia 08/25/2016  . Ulcer of left midfoot with fat layer exposed ([place]) 08/13/2016  . Diabetic ulcer of left midfoot associated with type 2 diabetes mellitus, with fat layer exposed ([place]) 07/25/2016  . Peripheral neuropathy 07/22/2016  . Tobacco abuse 07/22/2016  . CAD in native  artery 05/18/2016  . CAD, multiple vessel 05/11/2016  . Positive cardiac stress test 05/11/2016  . Abnormal stress test 04/30/2016  . Pre-transplant evaluation for kidney transplant 04/30/2016  . S/P revision of total knee 11/26/2015  . Pain in the chest   . Acute on chronic diastolic heart failure ([place]) 07/05/2015  . Volume overload 07/04/2015  . Shortness of breath 07/04/2015  . Hypoxemia 07/04/2015  . Elevated troponin   . End-stage renal disease on hemodialysis ([place])   . Hypervolemia   . 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 Pyogenic bacterial  arthritis of knee, left ([place]) 08/07/2014  . Tachycardia 07/24/2014  . Acute upper respiratory infection 07/24/2014  . ESRD on dialysis ([place]) 07/14/2014  . Type II diabetes mellitus ([place]) 07/14/2014  . Anemia in chronic kidney disease 07/14/2014  . Congestive heart failure (CHF) ([place]) 07/13/2014  . Surgical wound dehiscence 05/09/2014  . Dehiscence of closure of skin 05/09/2014  . Total knee replacement status 04/10/2014  . Diabetes mellitus with renal manifestations, controlled ([place]) 10/24/2013  . Hypertensive renal disease 06/27/2013  . DM type 2 causing vascular disease ([place]) 06/27/2013  . Erectile dysfunction 06/27/2013  . Depression 06/27/2013  . Claudication of left lower extremity ([place]) 12/19/2012  . Essential hypertension, benign 12/19/2012  . Sinusitis, acute maxillary 11/22/2012  . Otitis, externa, infective 11/14/2012  . Leg edema, left 11/14/2012  . End stage renal disease ([place]) 10/02/2012  . Controlled type 2 DM with proteinuria or microalbuminuria 09/19/2012  . GERD (gastroesophageal reflux disease) 09/19/2012  . Leukocytosis 09/19/2012  . Lacunar infarction 08/17/2012  . Polymyalgia rheumatica ([place]) 08/17/2012  . 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 Essential hypertension 05/10/2012  . Vitamin D deficiency 05/10/2012  . Diabetes mellitus due to underlying condition ([place]) 05/10/2012  . Hyperlipidemia LDL goal <100 05/10/2012  . Anemia of chronic disease 05/10/2012  . Screening for prostate cancer 05/10/2012  . Chronic kidney disease (CKD), stage IV (severe) ([place]) 05/10/2012  . Peripheral autonomic neuropathy due to DM ([place]) 05/10/2012  . Callus of foot 05/10/2012  . Urgency of urination 05/10/2012  . Hyperkalemia 05/10/2012  . Candidiasis of the esophagus 10/12/2011  . Internal hemorrhoids without mention of complication 35/46/5681  . Pre-syncope 07/25/2009  . DJD (degenerative joint disease) of cervical spine 02/17/2009    Past Surgical History:  Procedure  Laterality Date  . ABDOMINAL AORTOGRAM N/A 08/25/2016   Procedure: ABDOMINAL AORTOGRAM;  Surgeon: Wellington Hampshire, MD;  Location: [place] [facility] CV LAB;  Service: Cardiovascular;  Laterality: N/A;  . AMPUTATION  01/21/2012   Procedure: AMPUTATION RAY;  Surgeon: [name], MD;  Location: [facility];  Service: Orthopedics;  Laterality: Left;  Left Foot 4th Ray Amputation  . AMPUTATION Left 05/04/2013   Procedure: AMPUTATION DIGIT;  Surgeon: [name], MD;  Location: [facility];  Service: Orthopedics;  Laterality: Left;  Left Great Toe Amputation at [facility]  . AMPUTATION Left 01/14/2017   Procedure: AMPUTATION ABOVE LEFT KNEE;  Surgeon: [name], MD;  Location: [facility];  Service: Orthopedics;  Laterality: Left;  . ANTERIOR CERVICAL DECOMP/DISCECTOMY FUSION  02/2011  . BACK SURGERY    . BASCILIC VEIN TRANSPOSITION Left 10/19/2012   Procedure: BASCILIC VEIN TRANSPOSITION;  Surgeon: Serafina Mitchell, MD;  Location: [facility];  Service: Vascular;  Laterality: Left;  . CARDIAC CATHETERIZATION     "before bypass"  . CORONARY ARTERY BYPASS GRAFT     x 5 with lima at [place]  KNEE ARTHROPLASTY WITH ANTIBIOTIC SPACERS Left 08/07/2014   Procedure: Replace Left Total Knee Arthroplasty,  Place Antibiotic Spacer;  Surgeon: [name], MD;  Location: [facility];  Service: Orthopedics;  Laterality: Left;  . I&D EXTREMITY Left 05/09/2014   Procedure: Irrigation and Debridement Left Knee and Closure of Total Knee Arthroplasty Incision;  Surgeon: [name], MD;  Location: [facility];  Service: Orthopedics;  Laterality: Left;  . I&D KNEE WITH POLY EXCHANGE Left 05/31/2014   Procedure: IRRIGATION AND DEBRIDEMENT LEFT KNEE, PLACE ANTIBIOTIC BEADS,  POLY EXCHANGE;  Surgeon: [name], MD;  Location: [facility];  Service: Orthopedics;  Laterality: Left;  . IRRIGATION AND DEBRIDEMENT KNEE Left 01/12/2017   Procedure: IRRIGATION AND DEBRIDEMENT LEFT KNEE;  Surgeon: [name], MD;  Location: [facility];  Service:  Orthopedics;  Laterality: Left;  . JOINT REPLACEMENT    . KNEE ARTHROSCOPY Left 08-25-2012  . LOWER EXTREMITY ANGIOGRAPHY Left 08/25/2016   Procedure: Lower Extremity Angiography;  Surgeon: Wellington Hampshire, MD;  Location: [facility] CV LAB;  Service: Cardiovascular;  Laterality: Left;  . PERIPHERAL VASCULAR BALLOON ANGIOPLASTY Left 08/25/2016   Procedure: PERIPHERAL VASCULAR BALLOON ANGIOPLASTY;  Surgeon: Wellington Hampshire, MD;  Location: [facility] CV LAB;  Service: Cardiovascular;  Laterality: Left;  lt peroneal and ant tibial arteries cutting balloon  . REFRACTIVE SURGERY Bilateral   . TOE AMPUTATION Bilateral    "I've lost 7 toes over the last 7 years" (05/09/2014)  . TOE SURGERY Left April 2015   Big toe removed on left foot.  . TONSILLECTOMY    . TOTAL KNEE ARTHROPLASTY Left 04/10/2014   Procedure: TOTAL KNEE ARTHROPLASTY;  Surgeon: [name], MD;  Location: [facility];  Service: Orthopedics;  Laterality: Left;  . TOTAL KNEE REVISION Left 10/25/2014   Procedure: LEFT TOTAL KNEE REVISION;  Surgeon: [name], MD;  Location: [facility];  Service: Orthopedics;  Laterality: Left;  . TOTAL KNEE REVISION Left 11/26/2015   Procedure: Removal Left Total Knee Arthroplasty, Hinged Total Knee Arthroplasty;  Surgeon: [name], MD;  Location: [facility];  Service: Orthopedics;  Laterality: Left;  . UVULOPALATOPHARYNGOPLASTY, TONSILLECTOMY AND SEPTOPLASTY  ~ 1989  . WOUND DEBRIDEMENT Left 05/09/2014   Dehiscence Left Total Knee Arthroplasty Incision        Home Medications    Prior to Admission medications   Medication Sig Start Date End Date Taking? Authorizing Provider  amLODipine (NORVASC) 5 MG tablet Take 5 mg by mouth daily.    Yes [provider]  aspirin EC 81 MG EC tablet Take 2 tablets (162 mg total) by mouth daily. Patient taking differently: Take 81 mg by mouth daily.  01/20/17  Yes Patrecia Pour, Christean Grief, MD  atorvastatin (LIPITOR) 40 MG tablet Take 40 mg by mouth at bedtime.     Yes [provider]  carvedilol (COREG) 12.5 MG tablet Take 1 tablet (12.5 mg total) by mouth 2 (two) times daily with a meal. 02/08/17  Yes Bonnell Public, MD  clopidogrel (PLAVIX) 75 MG tablet Take 1 tablet (75 mg total) by mouth daily. 08/25/16 08/25/17 Yes Wellington Hampshire, MD  Darbepoetin Alfa (ARANESP) 150 MCG/0.3ML SOSY injection Inject 0.3 mLs (150 mcg total) into the vein every Friday with hemodialysis. 02/11/17  Yes Bonnell Public, MD  ferric citrate (AURYXIA) 1 GM 210 MG(Fe) tablet Take 2 tablets (420 mg total) by mouth 3 (three) times daily with meals. 02/08/17  Yes Bonnell Public, MD  gabapentin (  NEURONTIN) 300 MG capsule TAKE ONE CAPSULE (300 MG) BY MOUTH DAILY AT BEDTIME 02/08/17  Yes Dana Allan I, MD  montelukast (SINGULAIR) 10 MG tablet TAKE 1 TABLET (10 MG TOTAL) BY MOUTH AT BEDTIME. 09/01/15  Yes Gildardo Cranker, DO  Multiple Vitamins-Minerals ([employer] SPECTRAVITE ULTRA MENS) TABS Take 1 tablet by mouth daily. 02/28/17  Yes [provider]  multivitamin (RENA-VIT) TABS tablet Take 1 tablet by mouth at bedtime. 02/08/17  Yes Dana Allan I, MD  sevelamer carbonate (RENVELA) 800 MG tablet Take 800-2,400 mg by mouth 3 (three) times daily with meals.   Yes [provider]  temazepam (RESTORIL) 15 MG capsule Take 30 mg by mouth at bedtime. 02/28/17  Yes [provider]  Amino Acids-Protein Hydrolys (FEEDING SUPPLEMENT, PRO-STAT SUGAR FREE 64,) LIQD Take 30 mLs by mouth 2 (two) times daily. 01/19/17   Doreatha Lew, MD  benzonatate (TESSALON) 100 MG capsule Take 1 capsule (100 mg total) by mouth every 8 (eight) hours. 04/04/17   Kinte Trim, Dellis Filbert, PA-C  Nutritional Supplements (FEEDING SUPPLEMENT, NEPRO CARB STEADY,) LIQD Take 237 mLs by mouth 2 (two) times daily between meals. Patient not taking: Reported on 04/04/2017 01/19/17   Doreatha Lew, MD    Family History Family History  Problem Relation Age of Onset  . Hypertension  Mother   . Cancer Mother 33       Ovarian  . Heart disease Maternal Aunt   . Stroke Maternal Grandfather     Social History Social History   Tobacco Use  . Smoking status: Former Smoker    Packs/day: 0.12    Years: 32.00    Pack years: 3.84    Types: Cigarettes    Last attempt to quit: 10/05/2016    Years since quitting: 0.4  . Smokeless tobacco: Never Used  Substance Use Topics  . Alcohol use: No    Alcohol/week: 0.0 oz  . Drug use: No     Allergies   Morphine and related and Tygacil [tigecycline]   Review of Systems Review of Systems  All other systems reviewed and are negative.  Physical Exam Updated Vital Signs BP 133/89 (BP Location: Right Arm)   Pulse 73   Temp 97.6 F (36.4 C)   Resp 18   SpO2 97%   Physical Exam  Constitutional: He is oriented to person, place, and time. He appears well-developed and well-nourished.  HENT:  Head: Normocephalic and atraumatic.  Eyes: Pupils are equal, round, and reactive to light. Conjunctivae are normal. Right eye exhibits no discharge. Left eye exhibits no discharge. No scleral icterus.  Neck: Normal range of motion. No JVD present. No tracheal deviation present.  Cardiovascular: Normal rate, regular rhythm, normal heart sounds and intact distal pulses. Exam reveals no friction rub.  No murmur heard. No JVD  Pulmonary/Chest: Effort normal and breath sounds normal. No stridor. No respiratory distress. He has no wheezes. He exhibits no tenderness.  Faint crackles lower lobes  Musculoskeletal:  .  Left lower extremity right with very minimal edema  Neurological: He is alert and oriented to person, place, and time. Coordination normal.  Psychiatric: He has a normal mood and affect. His behavior is normal. Judgment and thought content normal.  Nursing note and vitals reviewed.    ED Treatments / Results  Labs (all labs ordered are listed, but only abnormal results are displayed) Labs Reviewed  COMPREHENSIVE  METABOLIC PANEL - Abnormal; Notable for the following components:      Result Value  Chloride 92 (*)    CO2 20 (*)    BUN 60 (*)    Creatinine, Ser 10.10 (*)    Total Protein 8.6 (*)    GFR calc non Af Amer 5 (*)    GFR calc Af Amer 6 (*)    Anion gap 24 (*)    All other components within normal limits  CBC WITH DIFFERENTIAL/PLATELET - Abnormal; Notable for the following components:   RDW 18.0 (*)    Platelets 149 (*)    All other components within normal limits    EKG None  Radiology Dg Chest 2 View  Result Date: 04/04/2017 CLINICAL DATA:  Cough and congestion EXAM: CHEST - 2 VIEW COMPARISON:  January 29, 2017 FINDINGS: There is no edema or consolidation. Heart is upper normal in size with pulmonary vascularity within normal limits. Patient is status post coronary artery bypass grafting. No adenopathy. There is postoperative change in lower cervical region. There is degenerative change in the thoracic spine. IMPRESSION: No edema or consolidation.  Stable cardiac silhouette. Electronically Signed   By: Lowella Grip III M.D.   On: 04/04/2017 14:29    Procedures Procedures (including critical care time)  Medications Ordered in ED Medications - No data to display   Initial Impression / Assessment and Plan / ED Course  I have reviewed the triage vital signs and the nursing notes.  Pertinent labs & imaging results that were available during my care of the patient were reviewed by me and considered in my medical decision making (see chart for details).      Final Clinical Impressions(s) / ED Diagnoses   Final diagnoses:  Cough   Labs: CMP, CBC  Imaging: DG chest 2 view  Consults:  Therapeutics:  Discharge Meds: Tessalon  Assessment/Plan:   57 year old male presents today with 1 day of cough.  He is well-appearing no acute distress afebrile reassuring oxygen saturation, negative chest x-ray.  I have very low suspicion for bacterial etiology, likely viral URI.   Patient does have very faint crackles, he did not have dialysis today but will have a tomorrow morning.  I do not feel his physical exam findings are likely secondary to infection given no other symptoms.  Patient will follow-up tomorrow with dialysis, he will follow-up with his primary care if symptoms continue to persist and return to emergency room if they worsen.  Patient verbalized understanding and agreement to today's plan had no further questions or concerns.  Patient's laboratory analysis consistent with previous with elevated BUN and creatinine, potassium normal; no need for urgent dialysis at this time.   ED Discharge Orders        Ordered    benzonatate (TESSALON) 100 MG capsule  Every 8 hours     04/04/17 1803       Okey Regal, PA-C 04/04/17 1834    Shalunda Lindh, Dellis Filbert, PA-C 04/04/17 Jodelle Red, MD 04/04/17 2153

## 2017-04-21 ENCOUNTER — Telehealth (INDEPENDENT_AMBULATORY_CARE_PROVIDER_SITE_OTHER): Payer: Self-pay | Admitting: Orthopedic Surgery

## 2017-04-21 NOTE — Telephone Encounter (Signed)
Last 2 ov notes sent to [facility] along with rx/cmn

## 2017-04-28 ENCOUNTER — Other Ambulatory Visit (INDEPENDENT_AMBULATORY_CARE_PROVIDER_SITE_OTHER): Payer: Self-pay | Admitting: Family

## 2017-05-24 ENCOUNTER — Encounter (INDEPENDENT_AMBULATORY_CARE_PROVIDER_SITE_OTHER): Payer: Self-pay | Admitting: Orthopedic Surgery

## 2017-05-24 ENCOUNTER — Ambulatory Visit (INDEPENDENT_AMBULATORY_CARE_PROVIDER_SITE_OTHER): Payer: Medicare HMO | Admitting: Orthopedic Surgery

## 2017-05-24 VITALS — Ht 71.0 in | Wt 148.0 lb

## 2017-05-24 DIAGNOSIS — IMO0002 Reserved for concepts with insufficient information to code with codable children: Secondary | ICD-10-CM

## 2017-05-24 DIAGNOSIS — Z89612 Acquired absence of left leg above knee: Secondary | ICD-10-CM | POA: Diagnosis not present

## 2017-05-24 NOTE — Progress Notes (Signed)
Office Visit Note   Patient: Matthew Kline           Date of Birth: June 24, 1960           MRN: [id number] Visit Date: 05/24/2017              Requested by: Sandi Mariscal, [place], [place] [id number] PCP: Sandi Mariscal, MD  Chief Complaint  Patient presents with  . Left Leg - Follow-up    01/2017 left AKA      HPI: Patient is a 57 year old gentleman with diabetic neuropathy end-stage renal disease on dialysis who underwent a revision of his total knee arthroplasty on 11/26/2015.  Patient was doing well and then sustained a quad tendon rupture and eventually a septic joint on 01/10/2017.  Due to the septic joint and quad tendon rupture patient required an above-the-knee amputation on the left.  Patient's septic knee and quad tendon rupture was not related to his initial work-related injury.  Assessment & Plan: Visit Diagnoses:  1. Above knee amputation status, left ([place])     Plan: Recommend patient follow-up with [facility] he was given a prescription for a prosthetic fitting.  Patient has undergone a trial socket he does have a liner and patient states that he feels extremely well.  I will follow-up once his prosthesis is fit.  Follow-Up Instructions: Return if symptoms worsen or fail to improve.   Ortho Exam  Patient is alert, oriented, no adenopathy, well-dressed, normal affect, normal respiratory effort. Examination patient has a well-healed left above-the-knee amputation.  There is some redundant tissue from his weight loss but this may not cause a problem with prosthetic fitting.  If it does recommend following up with cleared [facility] for redundant tissue excision if this is necessary.  Patient has good hair growth there is good soft tissue over the residual limb his leg is well consolidated with no signs of infection.  Imaging: No results found. No images are attached to the encounter.  Labs: Lab Results  Component Value Date   HGBA1C 5.7 (H) 02/07/2017   HGBA1C 6.4 (H) 11/08/2016   HGBA1C 6.6 (H) 11/24/2015   ESRSEDRATE 80 (H) 11/29/2015   ESRSEDRATE 75 (H) 08/07/2014   ESRSEDRATE >140 (H) 06/03/2014   CRP 9.8 (H) 07/14/2016   CRP 20.4 (H) 11/29/2015   CRP 5.6 (H) 08/07/2014   REPTSTATUS 02/04/2017 FINAL 01/30/2017   GRAMSTAIN  01/12/2017    RARE WBC PRESENT, PREDOMINANTLY MONONUCLEAR FEW GRAM POSITIVE COCCI IN PAIRS    CULT NO GROWTH 5 DAYS 01/30/2017   LABORGA STAPHYLOCOCCUS AUREUS 01/12/2017     Lab Results  Component Value Date   ALBUMIN 3.5 04/04/2017   ALBUMIN 2.4 (L) 02/07/2017   ALBUMIN 2.6 (L) 02/04/2017    Body mass index is 20.64 kg/m.  Orders:  No orders of the defined types were placed in this encounter.  No orders of the defined types were placed in this encounter.    Procedures: No procedures performed  Clinical Data: No additional findings.  ROS:  All other systems negative, except as noted in the HPI. Review of Systems  Objective: Vital Signs: Ht 5\' 11"  (1.803 m)   Wt 148 lb (67.1 kg)   BMI 20.64 kg/m   Specialty Comments:  No specialty comments available.  PMFS History: Patient Active Problem List   Diagnosis Date Noted  . Altered mental status   . Cerebral thrombosis with cerebral infarction 02/06/2017  . Cerebral embolism with cerebral infarction 02/06/2017  .  Above knee amputation status, left ([place])   . Pressure injury of skin 01/30/2017  . Acute lower UTI 01/29/2017  . Acute metabolic encephalopathy 15/17/6160  . UTI (urinary tract infection) 01/29/2017  . Quadriceps muscle rupture, left, initial encounter   . Fall 01/09/2017  . Gait disturbance 01/09/2017  . Staphylococcus aureus infection 01/09/2017  . Infection of prosthetic left knee joint ([place]) 01/09/2017  . Fever, unknown origin 11/09/2016  . Critical lower limb ischemia 08/25/2016  . Ulcer of left midfoot with fat layer exposed ([place]) 08/13/2016  . Diabetic ulcer of left midfoot associated with type 2 diabetes  mellitus, with fat layer exposed ([place]) 07/25/2016  . Peripheral neuropathy 07/22/2016  . Tobacco abuse 07/22/2016  . CAD in native artery 05/18/2016  . CAD, multiple vessel 05/11/2016  . Positive cardiac stress test 05/11/2016  . Abnormal stress test 04/30/2016  . Pre-transplant evaluation for kidney transplant 04/30/2016  . S/P revision of total knee 11/26/2015  . Pain in the chest   . Acute on chronic diastolic heart failure ([place]) 07/05/2015  . Volume overload 07/04/2015  . Shortness of breath 07/04/2015  . Hypoxemia 07/04/2015  . Elevated troponin   . End-stage renal disease on hemodialysis ([place])   . Hypervolemia   . 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 Pyogenic bacterial arthritis of knee, left ([place]) 08/07/2014  . Tachycardia 07/24/2014  . Acute upper respiratory infection 07/24/2014  . ESRD on dialysis ([place]) 07/14/2014  . Type II diabetes mellitus ([place]) 07/14/2014  . Anemia in chronic kidney disease 07/14/2014  . Congestive heart failure (CHF) ([place]) 07/13/2014  . Surgical wound dehiscence 05/09/2014  . Dehiscence of closure of skin 05/09/2014  . Total knee replacement status 04/10/2014  . Diabetes mellitus with renal manifestations, controlled ([place]) 10/24/2013  . Hypertensive renal disease 06/27/2013  . DM type 2 causing vascular disease ([place]) 06/27/2013  . Erectile dysfunction 06/27/2013  . Depression 06/27/2013  . Claudication of left lower extremity ([place]) 12/19/2012  . Essential hypertension, benign 12/19/2012  . Sinusitis, acute maxillary 11/22/2012  . Otitis, externa, infective 11/14/2012  . Leg edema, left 11/14/2012  . End stage renal disease ([place]) 10/02/2012  . Controlled type 2 DM with proteinuria or microalbuminuria 09/19/2012  . GERD (gastroesophageal reflux disease) 09/19/2012  . Leukocytosis 09/19/2012  . Lacunar infarction ([place]) 08/17/2012  . Polymyalgia rheumatica ([place]) 08/17/2012  . 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 Essential  hypertension 05/10/2012  . Vitamin D deficiency 05/10/2012  . Diabetes mellitus due to underlying condition ([place]) 05/10/2012  . Hyperlipidemia LDL goal <100 05/10/2012  . Anemia of chronic disease 05/10/2012  . Screening for prostate cancer 05/10/2012  . Chronic kidney disease (CKD), stage IV (severe) ([place]) 05/10/2012  . Peripheral autonomic neuropathy due to DM ([place]) 05/10/2012  . Callus of foot 05/10/2012  . Urgency of urination 05/10/2012  . Hyperkalemia 05/10/2012  . Candidiasis of the esophagus 10/12/2011  . Internal hemorrhoids without mention of complication 73/71/0626  . Pre-syncope 07/25/2009  . DJD (degenerative joint disease) of cervical spine 02/17/2009   Past Medical History:  Diagnosis Date  . Anemia, unspecified   . Anxiety   . Arthralgia 2010   polyarticular  . Arthritis    "back, knees" (01/10/2017)  . Cancer [facility])    "kidney area" (01/10/2017)  . CHF (congestive heart failure) ([place]) 07/25/2009   denies  . Chronic lower back pain   . Coronary artery disease   . Coughing    pt. reports that he has drainage  from sinus infection  . Diabetic foot ulcer ([place])   . Diabetic neuropathy ([place])   . ESRD (end stage renal disease) on dialysis [facility])    started 12/2012; "MWF; [facility] "  (01/10/2017)  . GERD (gastroesophageal reflux disease)    hx "before I lost weight", no problem 9 years  . Hemodialysis access site with mature fistula ([place])   . Hemorrhoids, internal 10/2011   small  . High cholesterol   . History of blood transfusion    "related to the anemia"  . Hypertension   . Insomnia, unspecified   . Lacunar infarction ([place]) 2006   RUE/RLE, speech  . Long term (current) use of anticoagulants   . Myocardial infarction ([place]) 1995  . Orthostatic hypotension   . Osteomyelitis of foot, left, acute ([place])   . Other chronic postoperative pain   . Pneumonia    "probably twice" (01/10/2017)  . Polymyalgia rheumatica ([place])   . Renal insufficiency   .  Sleep apnea    "lost weight; no more problem" (01/10/2017)  . Stroke ([place]) 01/10/06   denies residual on 05/09/2014  . Type II diabetes mellitus ([place]) dx'd 1995  . Unspecified hereditary and idiopathic peripheral neuropathy    feet  . Unspecified osteomyelitis, site unspecified   . Unspecified vitamin D deficiency     Family History  Problem Relation Age of Onset  . Hypertension Mother   . Cancer Mother 70       Ovarian  . Heart disease Maternal Aunt   . Stroke Maternal Grandfather     Past Surgical History:  Procedure Laterality Date  . ABDOMINAL AORTOGRAM N/A 08/25/2016   Procedure: ABDOMINAL AORTOGRAM;  Surgeon: Wellington [place], MD;  Location: [facility];  Service: Cardiovascular;  Laterality: N/A;  . AMPUTATION  01/21/2012   Procedure: AMPUTATION RAY;  Surgeon: [name], MD;  Location: [facility];  Service: Orthopedics;  Laterality: Left;  Left Foot 4th Ray Amputation  . AMPUTATION Left 05/04/2013   Procedure: AMPUTATION DIGIT;  Surgeon: [name], MD;  Location: [place];  Service: Orthopedics;  Laterality: Left;  Left Great Toe Amputation at [facility]  . AMPUTATION Left 01/14/2017   Procedure: AMPUTATION ABOVE LEFT KNEE;  Surgeon: [name], MD;  Location: [facility];  Service: Orthopedics;  Laterality: Left;  . ANTERIOR CERVICAL DECOMP/DISCECTOMY FUSION  02/2011  . BACK SURGERY    . BASCILIC VEIN TRANSPOSITION Left 10/19/2012   Procedure: BASCILIC VEIN TRANSPOSITION;  Surgeon: Serafina [place], MD;  Location: [facility];  Service: Vascular;  Laterality: Left;  . CARDIAC CATHETERIZATION     "before bypass"  . CORONARY ARTERY BYPASS GRAFT     x 5 with lima at [place] WITH ANTIBIOTIC SPACERS Left 08/07/2014   Procedure: Replace Left Total Knee Arthroplasty,  Place Antibiotic Spacer;  Surgeon: [name], MD;  Location: [place];  Service: Orthopedics;  Laterality: Left;  . I&D EXTREMITY Left 05/09/2014   Procedure: Irrigation and Debridement  Left Knee and Closure of Total Knee Arthroplasty Incision;  Surgeon: [name], MD;  Location: [facility];  Service: Orthopedics;  Laterality: Left;  . I&D KNEE WITH POLY EXCHANGE Left 05/31/2014   Procedure: IRRIGATION AND DEBRIDEMENT LEFT KNEE, PLACE ANTIBIOTIC BEADS,  POLY EXCHANGE;  Surgeon: [name], MD;  Location: [facility];  Service: Orthopedics;  Laterality: Left;  . IRRIGATION AND DEBRIDEMENT KNEE Left 01/12/2017   Procedure: IRRIGATION AND DEBRIDEMENT  LEFT KNEE;  Surgeon: [name], MD;  Location: [facility];  Service: Orthopedics;  Laterality: Left;  . JOINT REPLACEMENT    . KNEE ARTHROSCOPY Left 08-25-2012  . LOWER EXTREMITY ANGIOGRAPHY Left 08/25/2016   Procedure: Lower Extremity Angiography;  Surgeon: Wellington Hampshire, MD;  Location: [facility];  Service: Cardiovascular;  Laterality: Left;  . PERIPHERAL VASCULAR BALLOON ANGIOPLASTY Left 08/25/2016   Procedure: PERIPHERAL VASCULAR BALLOON ANGIOPLASTY;  Surgeon: Wellington Hampshire, MD;  Location: [facility];  Service: Cardiovascular;  Laterality: Left;  lt peroneal and ant tibial arteries cutting balloon  . REFRACTIVE SURGERY Bilateral   . TOE AMPUTATION Bilateral    "I've lost 7 toes over the last 7 years" (05/09/2014)  . TOE SURGERY Left April 2015   Big toe removed on left foot.  . TONSILLECTOMY    . TOTAL KNEE ARTHROPLASTY Left 04/10/2014   Procedure: TOTAL KNEE ARTHROPLASTY;  Surgeon: [name], MD;  Location: [facility];  Service: Orthopedics;  Laterality: Left;  . TOTAL KNEE REVISION Left 10/25/2014   Procedure: LEFT TOTAL KNEE REVISION;  Surgeon: [name], MD;  Location: [facility];  Service: Orthopedics;  Laterality: Left;  . TOTAL KNEE REVISION Left 11/26/2015   Procedure: Removal Left Total Knee Arthroplasty, Hinged Total Knee Arthroplasty;  Surgeon: [name], MD;  Location: [facility];  Service: Orthopedics;  Laterality: Left;  . UVULOPALATOPHARYNGOPLASTY, TONSILLECTOMY AND SEPTOPLASTY  ~ 1989  . WOUND  DEBRIDEMENT Left 05/09/2014   Dehiscence Left Total Knee Arthroplasty Incision   Social History   Occupational History  . Not on file  Tobacco Use  . Smoking status: Former Smoker    Packs/day: 0.12    Years: 32.00    Pack years: 3.84    Types: Cigarettes    Last attempt to quit: 10/05/2016    Years since quitting: 0.6  . Smokeless tobacco: Never Used  Substance and Sexual Activity  . Alcohol use: No    Alcohol/week: 0.0 oz  . Drug use: No  . Sexual activity: Not Currently

## 2017-05-25 ENCOUNTER — Encounter: Payer: Self-pay | Admitting: Gastroenterology

## 2017-06-08 ENCOUNTER — Other Ambulatory Visit (INDEPENDENT_AMBULATORY_CARE_PROVIDER_SITE_OTHER): Payer: Self-pay

## 2017-06-08 DIAGNOSIS — IMO0002 Reserved for concepts with insufficient information to code with codable children: Secondary | ICD-10-CM

## 2017-06-16 ENCOUNTER — Ambulatory Visit: Payer: Medicare HMO | Admitting: Gastroenterology

## 2017-06-23 ENCOUNTER — Ambulatory Visit: Payer: Medicare HMO | Attending: Orthopedic Surgery | Admitting: Physical Therapy

## 2017-06-23 ENCOUNTER — Other Ambulatory Visit: Payer: Self-pay

## 2017-06-23 ENCOUNTER — Encounter: Payer: Self-pay | Admitting: Physical Therapy

## 2017-06-23 DIAGNOSIS — R2689 Other abnormalities of gait and mobility: Secondary | ICD-10-CM

## 2017-06-23 DIAGNOSIS — R2681 Unsteadiness on feet: Secondary | ICD-10-CM | POA: Diagnosis present

## 2017-06-23 DIAGNOSIS — Z9181 History of falling: Secondary | ICD-10-CM

## 2017-06-23 DIAGNOSIS — M6281 Muscle weakness (generalized): Secondary | ICD-10-CM | POA: Diagnosis present

## 2017-06-23 DIAGNOSIS — R293 Abnormal posture: Secondary | ICD-10-CM | POA: Diagnosis present

## 2017-06-23 NOTE — Therapy (Signed)
[facility] [address] [place], [place], [address] Phone: [phone]   Fax:  [phone]  Physical Therapy Evaluation  Patient Details  Name: Matthew Kline MRN: [id number] Date of Birth: [birth year] Referring Provider: Meridee Score, MD   Encounter Date: 06/23/2017  PT End of Session - 06/23/17 0956    Visit Number  1    Number of Visits  25    Date for PT Re-Evaluation  09/21/17    Authorization Type  [employer]    PT Start Time  0905    PT Stop Time  0947    PT Time Calculation (min)  42 min    Equipment Utilized During Treatment  Gait belt    Activity Tolerance  Patient tolerated treatment well    Behavior During Therapy  [facility] for tasks assessed/performed       Past Medical History:  Diagnosis Date  . Anemia, unspecified   . Anxiety   . Arthralgia 2010   polyarticular  . Arthritis    "back, knees" (01/10/2017)  . Cancer [facility])    "kidney area" (01/10/2017)  . CHF (congestive heart failure) ([place]) 07/25/2009   denies  . Chronic lower back pain   . Coronary artery disease   . Coughing    pt. reports that he has drainage from sinus infection  . Diabetic foot ulcer ([place])   . Diabetic neuropathy ([place])   . ESRD (end stage renal disease) on dialysis [facility])    started 12/2012; "MWF; [facility] "  (01/10/2017)  . GERD (gastroesophageal reflux disease)    hx "before I lost weight", no problem 9 years  . Hemodialysis access site with mature fistula ([place])   . Hemorrhoids, internal 10/2011   small  . High cholesterol   . History of blood transfusion    "related to the anemia"  . Hypertension   . Insomnia, unspecified   . Lacunar infarction ([place]) 2006   RUE/RLE, speech  . Long term (current) use of anticoagulants   . Myocardial infarction ([place]) 1995  . Orthostatic hypotension   . Osteomyelitis of foot, left, acute ([place])   . Other chronic postoperative pain   . Pneumonia    "probably twice"  (01/10/2017)  . Polymyalgia rheumatica ([place])   . Renal insufficiency   . Sleep apnea    "lost weight; no more problem" (01/10/2017)  . Stroke ([place]) 01/10/06   denies residual on 05/09/2014  . Type II diabetes mellitus ([place]) dx'd 1995  . Unspecified hereditary and idiopathic peripheral neuropathy    feet  . Unspecified osteomyelitis, site unspecified   . Unspecified vitamin D deficiency     Past Surgical History:  Procedure Laterality Date  . ABDOMINAL AORTOGRAM N/A 08/25/2016   Procedure: ABDOMINAL AORTOGRAM;  Surgeon: Wellington Hampshire, MD;  Location: [facility];  Service: Cardiovascular;  Laterality: N/A;  . AMPUTATION  01/21/2012   Procedure: AMPUTATION RAY;  Surgeon: [name], MD;  Location: [facility];  Service: Orthopedics;  Laterality: Left;  Left Foot 4th Ray Amputation  . AMPUTATION Left 05/04/2013   Procedure: AMPUTATION DIGIT;  Surgeon: [name], MD;  Location: [facility];  Service: Orthopedics;  Laterality: Left;  Left Great Toe Amputation at [facility]  . AMPUTATION Left 01/14/2017   Procedure: AMPUTATION ABOVE LEFT KNEE;  Surgeon: [name], MD;  Location: [facility];  Service: Orthopedics;  Laterality: Left;  . ANTERIOR CERVICAL DECOMP/DISCECTOMY FUSION  02/2011  . BACK SURGERY    .  BASCILIC VEIN TRANSPOSITION Left 10/19/2012   Procedure: BASCILIC VEIN TRANSPOSITION;  Surgeon: Serafina Mitchell, MD;  Location: [facility];  Service: Vascular;  Laterality: Left;  . CARDIAC CATHETERIZATION     "before bypass"  . CORONARY ARTERY BYPASS GRAFT     x 5 with lima at [facility] WITH ANTIBIOTIC SPACERS Left 08/07/2014   Procedure: Replace Left Total Knee Arthroplasty,  Place Antibiotic Spacer;  Surgeon: [name], MD;  Location: [facility];  Service: Orthopedics;  Laterality: Left;  . I&D EXTREMITY Left 05/09/2014   Procedure: Irrigation and Debridement Left Knee and Closure of Total Knee Arthroplasty Incision;  Surgeon: [name], MD;  Location: [facility];  Service: Orthopedics;  Laterality: Left;  . I&D KNEE WITH POLY EXCHANGE Left 05/31/2014   Procedure: IRRIGATION AND DEBRIDEMENT LEFT KNEE, PLACE ANTIBIOTIC BEADS,  POLY EXCHANGE;  Surgeon: [name], MD;  Location: [facility];  Service: Orthopedics;  Laterality: Left;  . IRRIGATION AND DEBRIDEMENT KNEE Left 01/12/2017   Procedure: IRRIGATION AND DEBRIDEMENT LEFT KNEE;  Surgeon: [name], MD;  Location: [facility];  Service: Orthopedics;  Laterality: Left;  . JOINT REPLACEMENT    . KNEE ARTHROSCOPY Left 08-25-2012  . LOWER EXTREMITY ANGIOGRAPHY Left 08/25/2016   Procedure: Lower Extremity Angiography;  Surgeon: Wellington Hampshire, MD;  Location: [place] [facility];  Service: Cardiovascular;  Laterality: Left;  . PERIPHERAL VASCULAR BALLOON ANGIOPLASTY Left 08/25/2016   Procedure: PERIPHERAL VASCULAR BALLOON ANGIOPLASTY;  Surgeon: Wellington Hampshire, MD;  Location: [place] [facility];  Service: Cardiovascular;  Laterality: Left;  lt peroneal and ant tibial arteries cutting balloon  . REFRACTIVE SURGERY Bilateral   . TOE AMPUTATION Bilateral    "I've lost 7 toes over the last 7 years" (05/09/2014)  . TOE SURGERY Left April 2015   Big toe removed on left foot.  . TONSILLECTOMY    . TOTAL KNEE ARTHROPLASTY Left 04/10/2014   Procedure: TOTAL KNEE ARTHROPLASTY;  Surgeon: [name], MD;  Location: [facility] [place];  Service: Orthopedics;  Laterality: Left;  . TOTAL KNEE REVISION Left 10/25/2014   Procedure: LEFT TOTAL KNEE REVISION;  Surgeon: [name], MD;  Location: [facility];  Service: Orthopedics;  Laterality: Left;  . TOTAL KNEE REVISION Left 11/26/2015   Procedure: Removal Left Total Knee Arthroplasty, Hinged Total Knee Arthroplasty;  Surgeon: [name], MD;  Location: [facility];  Service: Orthopedics;  Laterality: Left;  . UVULOPALATOPHARYNGOPLASTY, TONSILLECTOMY AND SEPTOPLASTY  ~ 1989  . WOUND DEBRIDEMENT Left 05/09/2014   Dehiscence Left Total Knee Arthroplasty Incision    There were no vitals  filed for this visit.   Subjective Assessment - 06/23/17 0911    Subjective  Patient is a 57 year old gentleman with diabetic neuropathy end-stage renal disease on dialysis who underwent a revision of his total knee arthroplasty on 11/26/2015.  Patient was doing well and then sustained a quad tendon rupture and eventually a septic joint / charcot foot on 01/10/2017 and underwent a left Transfemoral Amputation on 01/14/2017. He was hospitalized 01/29/17-02/08/17 with acute encephalopathy with altered mental status. He recieved prosthesis 06/02/17.     Pertinent History  Left TFA, ESRD with dialysis M,W,F, CHF, CAD s/p CABG 05/2016, PAD, CVA, DM2, neuropathy, arthritis, right toe amputations    Limitations  Lifting;Standing;Walking;House hold activities    Patient Stated Goals  He wants to use prosthesis to walk without assistance, garden, cook / work in [employer]  Currently in Pain?  No/denies         [facility] PT Assessment - 06/23/17 0905      Assessment   Medical Diagnosis  left Transfemoral Amputation    Referring Provider  Meridee Score, MD    Onset Date/Surgical Date  06/02/17 Prosthesis delivery    Hand Dominance  Right      Precautions   Precautions  Fall    Precaution Comments  No BP LUE      Balance Screen   Has the patient fallen in the past 6 months  Yes    How many times?  2 1st when knee collapsed led to amputation, 2nd off balance    Has the patient had a decrease in activity level because of a fear of falling?   Yes    Is the patient reluctant to leave their home because of a fear of falling?   No      Home Environment   Living Environment  Private residence    Living Arrangements  Children daughter    Type of [place] entrance    Home Layout  One level    [place] - 2 wheels;Cane - single point;Tub bench;Wheelchair - manual      Prior Function   Level of Independence  Independent;Independent with household mobility without  device;Independent with community mobility without device    Vocation  On disability    Leisure  garden, cooking      Posture/Postural Control   Posture/Postural Control  Postural limitations    Postural Limitations  Flexed trunk;Weight shift right      ROM / Strength   AROM / PROM / Strength  Strength;AROM      AROM   Overall AROM   Within functional limits for tasks performed    Overall AROM Comments  left hip functionally appears to neutral but limited extension for terminal stance      Strength   Overall Strength  Deficits;Within functional limits for tasks performed    Overall Strength Comments  BUEs & RLE grossly 5/5 tested in sitting    Strength Assessment Site  Hip    Left Hip Flexion  4-/5    Left Hip Extension  3/5    Left Hip ABduction  3/5      Transfers   Transfers  Sit to Stand;Stand to Sit    Sit to Stand  5: Supervision;With upper extremity assist;With armrests;From chair/3-in-1 requires RW or MinA to stabilize    Sit to Stand Details (indicate cue type and reason)  Minimal engagement of TFA microprocessor knee    Stand to Sit  5: Supervision;With upper extremity assist;With armrests;To chair/3-in-1 requires RW or minA for stability    Stand to Sit Details  No engagement of TFA microprocessor prosthesis      Ambulation/Gait   Ambulation/Gait  Yes    Ambulation/Gait Assistance  5: Supervision    Ambulation/Gait Assistance Details  excessive UE weight bearing on RW, poor control of TFA prosthesis with advancement, initial contact and stance stability    Ambulation Distance (Feet)  200 Feet    Assistive device  Rolling walker;Prosthesis    Gait Pattern  Step-to pattern;Decreased step length - right;Decreased stance time - left;Decreased stride length;Decreased hip/knee flexion - left;Decreased weight shift to left;Left circumduction;Left hip hike;Antalgic;Lateral hip instability;Trunk flexed;Decreased trunk rotation;Abducted - left;Poor foot clearance - left     Ambulation Surface  Indoor;Level  Gait velocity  0.86 ft/sec    Stairs  Yes    Stairs Assistance  4: Min guard    Stairs Assistance Details (indicate cue type and reason)  PT verbal & demo modified technique up with RLE & down LLE,  proper weight shift and clearing prosthesis at step.     Stair Management Technique  Two rails;Step to pattern;Forwards    Number of Stairs  4      Standardized Balance Assessment   Standardized Balance Assessment  Berg Balance Test;Dynamic Gait Index      Berg Balance Test   Sit to Stand  Needs minimal aid to stand or to stabilize with RW = 3    Standing Unsupported  Needs several tries to stand 30 seconds unsupported with RW = 4    Sitting with Back Unsupported but Feet Supported on Floor or Stool  Able to sit safely and securely 2 minutes    Stand to Sit  Uses backs of legs against chair to control descent with RW = 3    Transfers  Able to transfer safely, definite need of hands    Standing Unsupported with Eyes Closed  Needs help to keep from falling with RW = 3    Standing Ubsupported with Feet Together  Needs help to attain position and unable to hold for 15 seconds with RW = 2    From Standing, Reach Forward with Outstretched Arm  Loses balance while trying/requires external support with RW = 2    From Standing Position, Pick up Object from Floor  Unable to try/needs assist to keep balance with RW = 3    From Standing Position, Turn to Look Behind Over each Shoulder  Needs assist to keep from losing balance and falling with RW = 2    Turn 360 Degrees  Needs assistance while turning with RW = 1    Standing Unsupported, Alternately Place Feet on Step/Stool  Needs assistance to keep from falling or unable to try with RW = 1    Standing Unsupported, One Foot in [employer] balance while stepping or standing with RW = 2    Standing on One Leg  Unable to try or needs assist to prevent fall wiht RW = 4    Total Score  11    Berg comment:  11/56 indicates  high fall risk & dependency in standing ADLs;  Merrilee Jansky is performed without UE support.  When tasks performed with RW support his score would be 37/56      Dynamic Gait Index   Level Surface  Severe Impairment    Change in Gait Speed  Severe Impairment    Gait with Horizontal Head Turns  Severe Impairment    Gait with Vertical Head Turns  Severe Impairment    Gait and Pivot Turn  Severe Impairment    Step Over Obstacle  Severe Impairment    Step Around Obstacles  Severe Impairment    Steps  Moderate Impairment    Total Score  1    DGI comment:  RW support      Prosthetics Assessment - 06/23/17 0905      Prosthetics   Prosthetic Care Dependent with  Skin check;Residual limb care;Prosthetic cleaning;Ply sock cleaning;Correct ply sock adjustment;Proper wear schedule/adjustment;Proper weight-bearing schedule/adjustment;Other (comment) weighing /wear at dialysis    Donning prosthesis   Supervision    Doffing prosthesis   Modified independent (Device/Increase time)    Current prosthetic wear tolerance (days/week)   worn  7 of last 7 days, recieved prosthesis 21 days ago, wore 4 days / wk for first 2 weeks due to dialysis.     Current prosthetic wear tolerance (#hours/day)   reports he has built up to ~12 hrs    Current prosthetic weight-bearing tolerance (hours/day)   Pt tolerated standing & gait with partial weight on prosthesis with no c/o pain or discomfort    Edema  fluctuating due to dialysis    K code/activity level with prosthetic use   K3 full community with variable cadence               Objective measurements completed on examination: See above findings.      [facility] Adult PT Treatment/Exercise - 06/23/17 0905      Prosthetics   Prosthetic Care Comments   PT instructed in proper method to weigh prosthesis for dialysis and need to subtract weight from weigh-in & weigh-out, Do NOT let dry weight be adjusted to include weight of prosthesis     Education Provided  Other  (comment) see prosthetic care comments    Person(s) Educated  Patient    Education Method  Explanation;Verbal cues    Education Method  Verbalized understanding               PT Short Term Goals - 06/23/17 1207      PT SHORT TERM GOAL #1   Title  Patient verbalizes proper method to weigh prosthesis & weigh-in / weigh-out with dialysis. (All STGs Target Date: 07/22/2017)    Time  4    Period  Weeks    Status  New    Target Date  07/22/17      PT SHORT TERM GOAL #2   Title  Patient ambulates 300' with RW or crutches engaging prosthesis with supervision / verbal cues.     Time  4    Period  Weeks    Status  New    Target Date  07/22/17      PT SHORT TERM GOAL #3   Title  Patient negotiates ramps, curbs with RW & stairs with 2 rails modified technique with supervision.     Time  4    Period  Weeks    Status  New    Target Date  07/22/17      PT SHORT TERM GOAL #4   Title  Patient sit to/from stand chairs with armrests without touching support surface to stabilize and engages prothesis with supervision.     Time  4    Period  Weeks    Status  New    Target Date  07/22/17      PT SHORT TERM GOAL #5   Title  Patient stands without UE support for 2 minutes with supervision.     Time  4    Period  Weeks    Status  New    Target Date  07/22/17        PT Long Term Goals - 06/23/17 1158      PT LONG TERM GOAL #1   Title  Patient demonstrates & verbalizes proper prosthetic care including dialysis issues to enable safe use of prosthesis. (All LTGs Target Date: 09/21/2017)    Time  12    Period  Weeks    Status  New    Target Date  09/21/17      PT LONG TERM GOAL #2   Title  Patient tolerates prosthesis wear >90% of awake hours without  skin or limb pain issues to enable function throughout his day.     Time  12    Period  Weeks    Status  New    Target Date  09/21/17      PT LONG TERM GOAL #3   Title  Berg Balance with device >36/56 to indicate lower fall risk.      Time  12    Period  Weeks    Status  New    Target Date  09/21/17      PT LONG TERM GOAL #4   Title  Patient ambulates 1000' outdoors including grass, ramps & curbs with cane or less and prosthesis modified independent.     Time  12    Period  Weeks    Status  New    Target Date  09/21/17      PT LONG TERM GOAL #5   Title  Patient modified independent on stairs including descending alternate pattern with 2 rails and step-to pattern with 1 rail & cane engaging prosthetic hydraulics.     Time  12    Period  Weeks    Status  New    Target Date  09/21/17      Additional Long Term Goals   Additional Long Term Goals  Yes      PT LONG TERM GOAL #6   Title  Patient demonstrates & verbalizes tasks with prosthesis to perform gardening & cooking modified independent to enable return to his hobbies.     Time  12    Period  Weeks    Status  New    Target Date  09/21/17             Plan - 06/23/17 1143    Clinical Impression Statement  This 57yo male had prior functional status of full community activities including hobbies of gardening & cooking until issues with Charcot foot June 2018 followed by left Transfemoral amputation 01/14/2017. Limited mobility along with complex medical issues including dialysis & cardiac issues have led to deconditioning & weakness. He received his first prosthesis on 06/02/2017 (21 days prior to PT evaluation). He has progressed wear to daily ~12 hours. Ideally he should wear prosthesis daily all awake hours to enable function throughout his days.  He needs cues on proper prosthetic issues like adjusting ply socks for limb volume changes. His balance is impaired with [employer] 11/56 and tasks of Berg with RW support 37/56. He has high fall risk for standing activities and dependency in standing ADLs. His gait has deviations & velocity of 0.86 ft/sec indicating high fall risk. He has excessive UE support on RW and poor control engagement of prosthesis. He is  dependent in safe negotiation of stairs with 2 rails. Patient appropriately (based on his potential) has a Transfemoral microprocessor prosthesis with limited ability to engage hydraulics. Patient would benefit from skilled PT to progress function & safety.     History and Personal Factors relevant to plan of care:  recently seperated from wife & living with daughter,  Left TFA, ESRD with dialysis M,W,F, CHF, CAD s/p CABG 05/2016, PAD, CVA, DM2, neuropathy, arthritis, right toe amputations    Clinical Presentation  Evolving    Clinical Presentation due to:  high fall risk, dependency with prosthesis, Left TFA, ESRD with dialysis M,W,F, CHF, CAD s/p CABG 05/2016, PAD, CVA, DM2, neuropathy, arthritis, right toe amputations    Clinical Decision Making  Moderate    Rehab Potential  Good  PT Frequency  2x / week    PT Duration  12 weeks    PT Treatment/Interventions  ADLs/Self Care Home Management;DME Instruction;Gait training;Stair training;Functional mobility training;Therapeutic activities;Therapeutic exercise;Balance training;Neuromuscular re-education;Canalith Repostioning;Patient/family education;Prosthetic Training;Vestibular    PT Next Visit Plan  HEP at sink, pre-gait in parallel bars working on terminal stance prosthetic knee flexion, proper prosthesis advancement & weight shift over prosthesis in stance    Consulted and Agree with Plan of Care  Patient       Patient will benefit from skilled therapeutic intervention in order to improve the following deficits and impairments:  Decreased activity tolerance, Abnormal gait, Decreased balance, Decreased endurance, Decreased knowledge of use of DME, Decreased mobility, Decreased strength, Prosthetic Dependency, Postural dysfunction, Dizziness  Visit Diagnosis: Muscle weakness (generalized)  Other abnormalities of gait and mobility  Unsteadiness on feet  History of falling  Abnormal posture     Problem List Patient Active Problem List    Diagnosis Date Noted  . Altered mental status   . Cerebral thrombosis with cerebral infarction 02/06/2017  . Cerebral embolism with cerebral infarction 02/06/2017  . Above knee amputation status, left ([place])   . Pressure injury of skin 01/30/2017  . Acute lower UTI 01/29/2017  . Acute metabolic encephalopathy 62/70/3500  . UTI (urinary tract infection) 01/29/2017  . Quadriceps muscle rupture, left, initial encounter   . Fall 01/09/2017  . Gait disturbance 01/09/2017  . Staphylococcus aureus infection 01/09/2017  . Infection of prosthetic left knee joint ([place]) 01/09/2017  . Fever, unknown origin 11/09/2016  . Critical lower limb ischemia 08/25/2016  . Ulcer of left midfoot with fat layer exposed ([place]) 08/13/2016  . Diabetic ulcer of left midfoot associated with type 2 diabetes mellitus, with fat layer exposed ([place]) 07/25/2016  . Peripheral neuropathy 07/22/2016  . Tobacco abuse 07/22/2016  . CAD in native artery 05/18/2016  . CAD, multiple vessel 05/11/2016  . Positive cardiac stress test 05/11/2016  . Abnormal stress test 04/30/2016  . Pre-transplant evaluation for kidney transplant 04/30/2016  . S/P revision of total knee 11/26/2015  . Pain in the chest   . Acute on chronic diastolic heart failure ([place]) 07/05/2015  . Volume overload 07/04/2015  . Shortness of breath 07/04/2015  . Hypoxemia 07/04/2015  . Elevated troponin   . End-stage renal disease on hemodialysis ([place])   . Hypervolemia   . 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 Pyogenic bacterial arthritis of knee, left ([place]) 08/07/2014  . Tachycardia 07/24/2014  . Acute upper respiratory infection 07/24/2014  . ESRD on dialysis ([place]) 07/14/2014  . Type II diabetes mellitus ([place]) 07/14/2014  . Anemia in chronic kidney disease 07/14/2014  . Congestive heart failure (CHF) ([place]) 07/13/2014  . Surgical wound dehiscence 05/09/2014  . Dehiscence of closure of skin 05/09/2014  . Total knee replacement status  04/10/2014  . Diabetes mellitus with renal manifestations, controlled ([place]) 10/24/2013  . Hypertensive renal disease 06/27/2013  . DM type 2 causing vascular disease ([place]) 06/27/2013  . Erectile dysfunction 06/27/2013  . Depression 06/27/2013  . Claudication of left lower extremity ([place]) 12/19/2012  . Essential hypertension, benign 12/19/2012  . Sinusitis, acute maxillary 11/22/2012  . Otitis, externa, infective 11/14/2012  . Leg edema, left 11/14/2012  . End stage renal disease ([place]) 10/02/2012  . Controlled type 2 DM with proteinuria or microalbuminuria 09/19/2012  . GERD (gastroesophageal reflux disease) 09/19/2012  . Leukocytosis 09/19/2012  . Lacunar infarction ([place]) 08/17/2012  . Polymyalgia rheumatica ([place]) 08/17/2012  . Bile reflux  gastritis 08/17/2012  . Essential hypertension 05/10/2012  . Vitamin D deficiency 05/10/2012  . Diabetes mellitus due to underlying condition ([place]) 05/10/2012  . Hyperlipidemia LDL goal <100 05/10/2012  . Anemia of chronic disease 05/10/2012  . Screening for prostate cancer 05/10/2012  . Chronic kidney disease (CKD), stage IV (severe) ([place]) 05/10/2012  . Peripheral autonomic neuropathy due to DM ([place]) 05/10/2012  . Callus of foot 05/10/2012  . Urgency of urination 05/10/2012  . Hyperkalemia 05/10/2012  . Candidiasis of the esophagus 10/12/2011  . Internal hemorrhoids without mention of complication 03/49/1791  . Pre-syncope 07/25/2009  . DJD (degenerative joint disease) of cervical spine 02/17/2009    Jiovani Mccammon  PT, DPT 06/23/2017, 2:17 PM  [facility] [address] [place], [place], [address] Phone: [phone]   Fax:  [phone]  Name: Bauer Ausborn MRN: [id number] Date of Birth: Oct 22, 1960

## 2017-06-28 ENCOUNTER — Ambulatory Visit: Payer: Medicare HMO | Admitting: Physical Therapy

## 2017-06-28 VITALS — BP 109/71 | HR 71

## 2017-06-28 DIAGNOSIS — M6281 Muscle weakness (generalized): Secondary | ICD-10-CM

## 2017-06-28 DIAGNOSIS — R293 Abnormal posture: Secondary | ICD-10-CM

## 2017-06-28 DIAGNOSIS — R2689 Other abnormalities of gait and mobility: Secondary | ICD-10-CM

## 2017-06-28 DIAGNOSIS — R2681 Unsteadiness on feet: Secondary | ICD-10-CM

## 2017-06-29 ENCOUNTER — Encounter: Payer: Self-pay | Admitting: Physical Therapy

## 2017-06-29 NOTE — Therapy (Signed)
[facility] [address] [facility] [place], [place], [address] Phone: [phone]   Fax:  [phone]  Physical Therapy Treatment  Patient Details  Name: Matthew Kline MRN: [id number] Date of Birth: 1960/07/18 Referring Provider: Meridee Score, MD   Encounter Date: 06/28/2017  PT End of Session - 06/28/17 2237    Visit Number  2    Number of Visits  25    Date for PT Re-Evaluation  09/21/17    Authorization Type  [employer]    PT Start Time  1400    PT Stop Time  1145    PT Time Calculation (min)  1305 min    Equipment Utilized During Treatment  Gait belt    Activity Tolerance  Patient tolerated treatment well;Patient limited by fatigue;Treatment limited secondary to medical complications (Comment)    Behavior During Therapy  [facility] for tasks assessed/performed       Past Medical History:  Diagnosis Date  . Anemia, unspecified   . Anxiety   . Arthralgia 2010   polyarticular  . Arthritis    "back, knees" (01/10/2017)  . Cancer [facility])    "kidney area" (01/10/2017)  . CHF (congestive heart failure) ([place]) 07/25/2009   denies  . Chronic lower back pain   . Coronary artery disease   . Coughing    pt. reports that he has drainage from sinus infection  . Diabetic foot ulcer ([place])   . Diabetic neuropathy ([place])   . ESRD (end stage renal disease) on dialysis [facility])    started 12/2012; "MWF; [place] "  (01/10/2017)  . GERD (gastroesophageal reflux disease)    hx "before I lost weight", no problem 9 years  . Hemodialysis access site with mature fistula ([place])   . Hemorrhoids, internal 10/2011   small  . High cholesterol   . History of blood transfusion    "related to the anemia"  . Hypertension   . Insomnia, unspecified   . Lacunar infarction ([place]) 2006   RUE/RLE, speech  . Long term (current) use of anticoagulants   . Myocardial infarction ([place]) 1995  . Orthostatic hypotension   . Osteomyelitis of foot,  left, acute ([place])   . Other chronic postoperative pain   . Pneumonia    "probably twice" (01/10/2017)  . Polymyalgia rheumatica ([place])   . Renal insufficiency   . Sleep apnea    "lost weight; no more problem" (01/10/2017)  . Stroke ([place]) 01/10/06   denies residual on 05/09/2014  . Type II diabetes mellitus ([place]) dx'd 1995  . Unspecified hereditary and idiopathic peripheral neuropathy    feet  . Unspecified osteomyelitis, site unspecified   . Unspecified vitamin D deficiency     Past Surgical History:  Procedure Laterality Date  . ABDOMINAL AORTOGRAM N/A 08/25/2016   Procedure: ABDOMINAL AORTOGRAM;  Surgeon: Wellington Hampshire, MD;  Location: [place] [facility];  Service: Cardiovascular;  Laterality: N/A;  . AMPUTATION  01/21/2012   Procedure: AMPUTATION RAY;  Surgeon: [name], MD;  Location: [place];  Service: Orthopedics;  Laterality: Left;  Left Foot 4th Ray Amputation  . AMPUTATION Left 05/04/2013   Procedure: AMPUTATION DIGIT;  Surgeon: [name], MD;  Location: [place];  Service: Orthopedics;  Laterality: Left;  Left Great Toe Amputation at [facility]  . AMPUTATION Left 01/14/2017   Procedure: AMPUTATION ABOVE LEFT KNEE;  Surgeon: [name], MD;  Location: [place];  Service: Orthopedics;  Laterality: Left;  . ANTERIOR CERVICAL  DECOMP/DISCECTOMY FUSION  02/2011  . BACK SURGERY    . BASCILIC VEIN TRANSPOSITION Left 10/19/2012   Procedure: BASCILIC VEIN TRANSPOSITION;  Surgeon: Serafina Mitchell, MD;  Location: [place];  Service: Vascular;  Laterality: Left;  . CARDIAC CATHETERIZATION     "before bypass"  . CORONARY ARTERY BYPASS GRAFT     x 5 with lima at [place] WITH ANTIBIOTIC SPACERS Left 08/07/2014   Procedure: Replace Left Total Knee Arthroplasty,  Place Antibiotic Spacer;  Surgeon: [name], MD;  Location: [place];  Service: Orthopedics;  Laterality: Left;  . I&D EXTREMITY Left 05/09/2014   Procedure: Irrigation and Debridement Left Knee  and Closure of Total Knee Arthroplasty Incision;  Surgeon: [name], MD;  Location: [place];  Service: Orthopedics;  Laterality: Left;  . I&D KNEE WITH POLY EXCHANGE Left 05/31/2014   Procedure: IRRIGATION AND DEBRIDEMENT LEFT KNEE, PLACE ANTIBIOTIC BEADS,  POLY EXCHANGE;  Surgeon: [name], MD;  Location: [place];  Service: Orthopedics;  Laterality: Left;  . IRRIGATION AND DEBRIDEMENT KNEE Left 01/12/2017   Procedure: IRRIGATION AND DEBRIDEMENT LEFT KNEE;  Surgeon: [name], MD;  Location: [place];  Service: Orthopedics;  Laterality: Left;  . JOINT REPLACEMENT    . KNEE ARTHROSCOPY Left 08-25-2012  . LOWER EXTREMITY ANGIOGRAPHY Left 08/25/2016   Procedure: Lower Extremity Angiography;  Surgeon: Wellington Hampshire, MD;  Location: [place] [facility];  Service: Cardiovascular;  Laterality: Left;  . PERIPHERAL VASCULAR BALLOON ANGIOPLASTY Left 08/25/2016   Procedure: PERIPHERAL VASCULAR BALLOON ANGIOPLASTY;  Surgeon: Wellington Hampshire, MD;  Location: [place] [facility];  Service: Cardiovascular;  Laterality: Left;  lt peroneal and ant tibial arteries cutting balloon  . REFRACTIVE SURGERY Bilateral   . TOE AMPUTATION Bilateral    "I've lost 7 toes over the last 7 years" (05/09/2014)  . TOE SURGERY Left April 2015   Big toe removed on left foot.  . TONSILLECTOMY    . TOTAL KNEE ARTHROPLASTY Left 04/10/2014   Procedure: TOTAL KNEE ARTHROPLASTY;  Surgeon: [name], MD;  Location: [place];  Service: Orthopedics;  Laterality: Left;  . TOTAL KNEE REVISION Left 10/25/2014   Procedure: LEFT TOTAL KNEE REVISION;  Surgeon: [name], MD;  Location: [place];  Service: Orthopedics;  Laterality: Left;  . TOTAL KNEE REVISION Left 11/26/2015   Procedure: Removal Left Total Knee Arthroplasty, Hinged Total Knee Arthroplasty;  Surgeon: [name], MD;  Location: [place];  Service: Orthopedics;  Laterality: Left;  . UVULOPALATOPHARYNGOPLASTY, TONSILLECTOMY AND SEPTOPLASTY  ~ 1989  . WOUND DEBRIDEMENT Left  05/09/2014   Dehiscence Left Total Knee Arthroplasty Incision    Vitals:   06/28/17 1435  BP: 109/71  Pulse: 71    Subjective Assessment - 06/28/17 1400    Subjective  Having trouble with BP at dialysis. He found instructions from PT at evaluation were very helpful.     Pertinent History  Left TFA, ESRD with dialysis M,W,F, CHF, CAD s/p CABG 05/2016, PAD, CVA, DM2, neuropathy, arthritis, right toe amputations    Limitations  Lifting;Standing;Walking;House hold activities    Patient Stated Goals  He wants to use prosthesis to walk without assistance, garden, cook / work in [employer]     Currently in Pain?  No/denies                       [facility] Adult PT Treatment/Exercise - 06/28/17 2226  Transfers   Transfers  Sit to Stand;Stand to Sit    Sit to Stand  5: Supervision;With upper extremity assist;With armrests;From chair/3-in-1 to RW    Sit to Stand Details  Tactile cues for weight shifting;Visual cues for safe use of DME/AE;Verbal cues for technique;Verbal cues for safe use of DME/AE    Sit to Stand Details (indicate cue type and reason)  cues to engage prosthesis, keeping contact with ground    Stand to Sit  5: Supervision;With upper extremity assist;With armrests;To chair/3-in-1 from RW    Stand to Sit Details (indicate cue type and reason)  Tactile cues for weight shifting;Visual cues for safe use of DME/AE;Verbal cues for technique;Verbal cues for safe use of DME/AE    Stand to Sit Details  cues to engage prosthetic hydraulics, unlocking prior to initiating sitting      Ambulation/Gait   Ambulation/Gait  Yes    Ambulation/Gait Assistance  5: Supervision    Ambulation/Gait Assistance Details  excessive UE weight bearing, minimal to no prosthetic knee flexion     Ambulation Distance (Feet)  150 Feet 150' X 2    Assistive device  Rolling walker;Prosthesis    Ambulation Surface  Indoor;Level    Stairs  --    Pre-Gait Activities  Parallel bars with mirror for visual  input, manual/tactile & verbal cues prosthetic control, unlocking terminal stance position, advancing once unlocked using pelvis and wt shift over prosthesis in stance.     Gait Comments  BP dropped at end of session.       Prosthetics   Prosthetic Care Comments   PT demo, instructed in proper donning with correct rotation. If need to adjust, then have to pull partially out of socket to release "bony Lock" correct rotation then reseat limb in socket with proper rotaion.     Education Provided  Proper Donning;Correct ply sock adjustment    Person(s) Educated  Patient    Education Method  Explanation;Demonstration;Tactile cues;Verbal cues    Education Method  Verbalized understanding;Needs further instruction;Returned demonstration               PT Short Term Goals - 06/23/17 1207      PT SHORT TERM GOAL #1   Title  Patient verbalizes proper method to weigh prosthesis & weigh-in / weigh-out with dialysis. (All STGs Target Date: 07/22/2017)    Time  4    Period  Weeks    Status  New    Target Date  07/22/17      PT SHORT TERM GOAL #2   Title  Patient ambulates 300' with RW or crutches engaging prosthesis with supervision / verbal cues.     Time  4    Period  Weeks    Status  New    Target Date  07/22/17      PT SHORT TERM GOAL #3   Title  Patient negotiates ramps, curbs with RW & stairs with 2 rails modified technique with supervision.     Time  4    Period  Weeks    Status  New    Target Date  07/22/17      PT SHORT TERM GOAL #4   Title  Patient sit to/from stand chairs with armrests without touching support surface to stabilize and engages prothesis with supervision.     Time  4    Period  Weeks    Status  New    Target Date  07/22/17      PT  SHORT TERM GOAL #5   Title  Patient stands without UE support for 2 minutes with supervision.     Time  4    Period  Weeks    Status  New    Target Date  07/22/17        PT Long Term Goals - 06/23/17 1158      PT LONG  TERM GOAL #1   Title  Patient demonstrates & verbalizes proper prosthetic care including dialysis issues to enable safe use of prosthesis. (All LTGs Target Date: 09/21/2017)    Time  12    Period  Weeks    Status  New    Target Date  09/21/17      PT LONG TERM GOAL #2   Title  Patient tolerates prosthesis wear >90% of awake hours without skin or limb pain issues to enable function throughout his day.     Time  12    Period  Weeks    Status  New    Target Date  09/21/17      PT LONG TERM GOAL #3   Title  Berg Balance with device >36/56 to indicate lower fall risk.     Time  12    Period  Weeks    Status  New    Target Date  09/21/17      PT LONG TERM GOAL #4   Title  Patient ambulates 1000' outdoors including grass, ramps & curbs with cane or less and prosthesis modified independent.     Time  12    Period  Weeks    Status  New    Target Date  09/21/17      PT LONG TERM GOAL #5   Title  Patient modified independent on stairs including descending alternate pattern with 2 rails and step-to pattern with 1 rail & cane engaging prosthetic hydraulics.     Time  12    Period  Weeks    Status  New    Target Date  09/21/17      Additional Long Term Goals   Additional Long Term Goals  Yes      PT LONG TERM GOAL #6   Title  Patient demonstrates & verbalizes tasks with prosthesis to perform gardening & cooking modified independent to enable return to his hobbies.     Time  12    Period  Weeks    Status  New    Target Date  09/21/17            Plan - 06/28/17 2238    Clinical Impression Statement  Patient seems to have better understanding how to donne with proper rotation. He improved prosthesis control with pre-gait activities. Resistance of hydraulics is set high for safety and makes unlocking knee difficult.     Rehab Potential  Good    PT Frequency  2x / week    PT Duration  12 weeks    PT Treatment/Interventions  ADLs/Self Care Home Management;DME Instruction;Gait  training;Stair training;Functional mobility training;Therapeutic activities;Therapeutic exercise;Balance training;Neuromuscular re-education;Canalith Repostioning;Patient/family education;Prosthetic Training;Vestibular    PT Next Visit Plan  prosthetist to next session possibly, HEP at sink, pre-gait in parallel bars working on terminal stance prosthetic knee flexion, proper prosthesis advancement & weight shift over prosthesis in stance    Consulted and Agree with Plan of Care  Patient       Patient will benefit from skilled therapeutic intervention in order to improve the following deficits and impairments:  Decreased activity tolerance, Abnormal gait, Decreased balance, Decreased endurance, Decreased knowledge of use of DME, Decreased mobility, Decreased strength, Prosthetic Dependency, Postural dysfunction, Dizziness  Visit Diagnosis: Muscle weakness (generalized)  Other abnormalities of gait and mobility  Unsteadiness on feet  Abnormal posture     Problem List Patient Active Problem List   Diagnosis Date Noted  . Altered mental status   . Cerebral thrombosis with cerebral infarction 02/06/2017  . Cerebral embolism with cerebral infarction 02/06/2017  . Above knee amputation status, left ([place])   . Pressure injury of skin 01/30/2017  . Acute lower UTI 01/29/2017  . Acute metabolic encephalopathy 44/01/270  . UTI (urinary tract infection) 01/29/2017  . Quadriceps muscle rupture, left, initial encounter   . Fall 01/09/2017  . Gait disturbance 01/09/2017  . Staphylococcus aureus infection 01/09/2017  . Infection of prosthetic left knee joint ([place]) 01/09/2017  . Fever, unknown origin 11/09/2016  . Critical lower limb ischemia 08/25/2016  . Ulcer of left midfoot with fat layer exposed ([place]) 08/13/2016  . Diabetic ulcer of left midfoot associated with type 2 diabetes mellitus, with fat layer exposed ([place]) 07/25/2016  . Peripheral neuropathy 07/22/2016  . Tobacco abuse  07/22/2016  . CAD in native artery 05/18/2016  . CAD, multiple vessel 05/11/2016  . Positive cardiac stress test 05/11/2016  . Abnormal stress test 04/30/2016  . Pre-transplant evaluation for kidney transplant 04/30/2016  . S/P revision of total knee 11/26/2015  . Pain in the chest   . Acute on chronic diastolic heart failure ([place]) 07/05/2015  . Volume overload 07/04/2015  . Shortness of breath 07/04/2015  . Hypoxemia 07/04/2015  . Elevated troponin   . End-stage renal disease on hemodialysis ([place])   . Hypervolemia   . 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 Pyogenic bacterial arthritis of knee, left ([place]) 08/07/2014  . Tachycardia 07/24/2014  . Acute upper respiratory infection 07/24/2014  . ESRD on dialysis ([place]) 07/14/2014  . Type II diabetes mellitus ([place]) 07/14/2014  . Anemia in chronic kidney disease 07/14/2014  . Congestive heart failure (CHF) ([place]) 07/13/2014  . Surgical wound dehiscence 05/09/2014  . Dehiscence of closure of skin 05/09/2014  . Total knee replacement status 04/10/2014  . Diabetes mellitus with renal manifestations, controlled ([place]) 10/24/2013  . Hypertensive renal disease 06/27/2013  . DM type 2 causing vascular disease ([place]) 06/27/2013  . Erectile dysfunction 06/27/2013  . Depression 06/27/2013  . Claudication of left lower extremity ([place]) 12/19/2012  . Essential hypertension, benign 12/19/2012  . Sinusitis, acute maxillary 11/22/2012  . Otitis, externa, infective 11/14/2012  . Leg edema, left 11/14/2012  . End stage renal disease ([place]) 10/02/2012  . Controlled type 2 DM with proteinuria or microalbuminuria 09/19/2012  . GERD (gastroesophageal reflux disease) 09/19/2012  . Leukocytosis 09/19/2012  . Lacunar infarction ([place]) 08/17/2012  . Polymyalgia rheumatica ([place]) 08/17/2012  . 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 Essential hypertension 05/10/2012  . Vitamin D deficiency 05/10/2012  . Diabetes mellitus due to underlying condition  ([place]) 05/10/2012  . Hyperlipidemia LDL goal <100 05/10/2012  . Anemia of chronic disease 05/10/2012  . Screening for prostate cancer 05/10/2012  . Chronic kidney disease (CKD), stage IV (severe) ([place]) 05/10/2012  . Peripheral autonomic neuropathy due to DM ([place]) 05/10/2012  . Callus of foot 05/10/2012  . Urgency of urination 05/10/2012  . Hyperkalemia 05/10/2012  . Candidiasis of the esophagus 10/12/2011  . Internal hemorrhoids without mention of complication 53/66/4403  . Pre-syncope 07/25/2009  . DJD (degenerative joint disease)  of cervical spine 02/17/2009    Conlee Sliter  PT, DPT 06/29/2017, 12:41 PM  [place] [address] [place], [place], [address] Phone: [phone]   Fax:  [phone]  Name: Matthew Kline MRN: [id number] Date of Birth: 1960-10-06

## 2017-06-30 ENCOUNTER — Ambulatory Visit: Payer: Medicare HMO | Admitting: Physical Therapy

## 2017-06-30 ENCOUNTER — Encounter: Payer: Self-pay | Admitting: Physical Therapy

## 2017-06-30 DIAGNOSIS — R2689 Other abnormalities of gait and mobility: Secondary | ICD-10-CM

## 2017-06-30 DIAGNOSIS — R293 Abnormal posture: Secondary | ICD-10-CM

## 2017-06-30 DIAGNOSIS — M6281 Muscle weakness (generalized): Secondary | ICD-10-CM | POA: Diagnosis not present

## 2017-07-01 ENCOUNTER — Encounter

## 2017-07-01 NOTE — Therapy (Signed)
[facility] [address] [place], [place], [address] Phone: [phone]   Fax:  [phone]  Physical Therapy Treatment  Patient Details  Name: Matthew Kline MRN: [id number] Date of Birth: [birth year] Referring Provider: Meridee Score, MD   Encounter Date: 06/30/2017  PT End of Session - 06/30/17 1224    Visit Number  3    Number of Visits  25    Date for PT Re-Evaluation  09/21/17    Authorization Type  [employer]    PT Start Time  0930    PT Stop Time  1020    PT Time Calculation (min)  50 min    Equipment Utilized During Treatment  Gait belt    Activity Tolerance  Patient tolerated treatment well;Patient limited by fatigue;Treatment limited secondary to medical complications (Comment)    Behavior During Therapy  [facility] for tasks assessed/performed       Past Medical History:  Diagnosis Date  . Anemia, unspecified   . Anxiety   . Arthralgia 2010   polyarticular  . Arthritis    "back, knees" (01/10/2017)  . Cancer [facility])    "kidney area" (01/10/2017)  . CHF (congestive heart failure) ([place]) 07/25/2009   denies  . Chronic lower back pain   . Coronary artery disease   . Coughing    pt. reports that he has drainage from sinus infection  . Diabetic foot ulcer ([place])   . Diabetic neuropathy ([place])   . ESRD (end stage renal disease) on dialysis [facility])    started 12/2012; "MWF; [place] "  (01/10/2017)  . GERD (gastroesophageal reflux disease)    hx "before I lost weight", no problem 9 years  . Hemodialysis access site with mature fistula ([place])   . Hemorrhoids, internal 10/2011   small  . High cholesterol   . History of blood transfusion    "related to the anemia"  . Hypertension   . Insomnia, unspecified   . Lacunar infarction ([place]) 2006   RUE/RLE, speech  . Long term (current) use of anticoagulants   . Myocardial infarction ([place]) 1995  . Orthostatic hypotension   . Osteomyelitis of foot,  left, acute ([place])   . Other chronic postoperative pain   . Pneumonia    "probably twice" (01/10/2017)  . Polymyalgia rheumatica ([place])   . Renal insufficiency   . Sleep apnea    "lost weight; no more problem" (01/10/2017)  . Stroke ([place]) 01/10/06   denies residual on 05/09/2014  . Type II diabetes mellitus ([place]) dx'd 1995  . Unspecified hereditary and idiopathic peripheral neuropathy    feet  . Unspecified osteomyelitis, site unspecified   . Unspecified vitamin D deficiency     Past Surgical History:  Procedure Laterality Date  . ABDOMINAL AORTOGRAM N/A 08/25/2016   Procedure: ABDOMINAL AORTOGRAM;  Surgeon: Wellington Hampshire, MD;  Location: [place] CV LAB;  Service: Cardiovascular;  Laterality: N/A;  . AMPUTATION  01/21/2012   Procedure: AMPUTATION RAY;  Surgeon: [name], MD;  Location: [place];  Service: Orthopedics;  Laterality: Left;  Left Foot 4th Ray Amputation  . AMPUTATION Left 05/04/2013   Procedure: AMPUTATION DIGIT;  Surgeon: [name], MD;  Location: [facility];  Service: Orthopedics;  Laterality: Left;  Left Great Toe Amputation at [facility]  . AMPUTATION Left 01/14/2017   Procedure: AMPUTATION ABOVE LEFT KNEE;  Surgeon: [name], MD;  Location: [place];  Service: Orthopedics;  Laterality: Left;  . ANTERIOR CERVICAL  DECOMP/DISCECTOMY FUSION  02/2011  . BACK SURGERY    . BASCILIC VEIN TRANSPOSITION Left 10/19/2012   Procedure: BASCILIC VEIN TRANSPOSITION;  Surgeon: Serafina Mitchell, MD;  Location: [place];  Service: Vascular;  Laterality: Left;  . CARDIAC CATHETERIZATION     "before bypass"  . CORONARY ARTERY BYPASS GRAFT     x 5 with lima at [place] WITH ANTIBIOTIC SPACERS Left 08/07/2014   Procedure: Replace Left Total Knee Arthroplasty,  Place Antibiotic Spacer;  Surgeon: [name], MD;  Location: [facility];  Service: Orthopedics;  Laterality: Left;  . I&D EXTREMITY Left 05/09/2014   Procedure: Irrigation and Debridement Left Knee  and Closure of Total Knee Arthroplasty Incision;  Surgeon: [name], MD;  Location: [facility];  Service: Orthopedics;  Laterality: Left;  . I&D KNEE WITH POLY EXCHANGE Left 05/31/2014   Procedure: IRRIGATION AND DEBRIDEMENT LEFT KNEE, PLACE ANTIBIOTIC BEADS,  POLY EXCHANGE;  Surgeon: [name], MD;  Location: [facility];  Service: Orthopedics;  Laterality: Left;  . IRRIGATION AND DEBRIDEMENT KNEE Left 01/12/2017   Procedure: IRRIGATION AND DEBRIDEMENT LEFT KNEE;  Surgeon: [name], MD;  Location: [place];  Service: Orthopedics;  Laterality: Left;  . JOINT REPLACEMENT    . KNEE ARTHROSCOPY Left 08-25-2012  . LOWER EXTREMITY ANGIOGRAPHY Left 08/25/2016   Procedure: Lower Extremity Angiography;  Surgeon: Wellington Hampshire, MD;  Location: [place] CV [facility];  Service: Cardiovascular;  Laterality: Left;  . PERIPHERAL VASCULAR BALLOON ANGIOPLASTY Left 08/25/2016   Procedure: PERIPHERAL VASCULAR BALLOON ANGIOPLASTY;  Surgeon: Wellington Hampshire, MD;  Location: [place] CV [facility];  Service: Cardiovascular;  Laterality: Left;  lt peroneal and ant tibial arteries cutting balloon  . REFRACTIVE SURGERY Bilateral   . TOE AMPUTATION Bilateral    "I've lost 7 toes over the last 7 years" (05/09/2014)  . TOE SURGERY Left April 2015   Big toe removed on left foot.  . TONSILLECTOMY    . TOTAL KNEE ARTHROPLASTY Left 04/10/2014   Procedure: TOTAL KNEE ARTHROPLASTY;  Surgeon: [name], MD;  Location: [place];  Service: Orthopedics;  Laterality: Left;  . TOTAL KNEE REVISION Left 10/25/2014   Procedure: LEFT TOTAL KNEE REVISION;  Surgeon: [name], MD;  Location: [facility];  Service: Orthopedics;  Laterality: Left;  . TOTAL KNEE REVISION Left 11/26/2015   Procedure: Removal Left Total Knee Arthroplasty, Hinged Total Knee Arthroplasty;  Surgeon: [name], MD;  Location: [place];  Service: Orthopedics;  Laterality: Left;  . UVULOPALATOPHARYNGOPLASTY, TONSILLECTOMY AND SEPTOPLASTY  ~ 1989  . WOUND DEBRIDEMENT Left  05/09/2014   Dehiscence Left Total Knee Arthroplasty Incision    There were no vitals filed for this visit.  Subjective Assessment - 06/30/17 1223    Subjective  Brooke Pace, prosthetist attending today's session. Patient reports he has been donning with proper rotation at PT instructed last session.     Pertinent History  Left TFA, ESRD with dialysis M,W,F, CHF, CAD s/p CABG 05/2016, PAD, CVA, DM2, neuropathy, arthritis, right toe amputations    Limitations  Lifting;Standing;Walking;House hold activities    Patient Stated Goals  He wants to use prosthesis to walk without assistance, garden, cook / work in [employer]     Currently in Pain?  No/denies      Prosthetic Training with Transfemoral microprocessor prosthesis: Brooke Pace, [facility] present with app able to adjust settings of prosthesis while PT performed below activities. Pt ambulated from lobby  to gym with RW with cues on step through pattern. PT raised ht of RW to facilitate upright posture. Pre-gait in parallel bars with BUE support, mirror for visual feedback, manual/tactile and verbal cues on unlocking prosthesis in terminal stance, advancing with pelvic motion with focus on left ASIS and weight shift with pelvis over prosthesis in stance.  Progressed to ambulating 39' with RW with carryover from pre-gait. Sit to stand maintaining contact with ground and extending left hip to engage prosthesis with cues to attempt to not touch RW for stabilization. Stand to sit unlocking prosthetic knee, then lowering pelvis over feet for first ~60* then shifting pelvis back towards chair. 15 reps with prosthetist making adjustments.                           PT Short Term Goals - 06/30/17 1510      PT SHORT TERM GOAL #1   Title  Patient verbalizes proper method to weigh prosthesis & weigh-in / weigh-out with dialysis. (All STGs Target Date: 07/22/2017)    Time  4    Period  Weeks    Status  On-going    Target Date  07/22/17       PT SHORT TERM GOAL #2   Title  Patient ambulates 300' with RW or crutches engaging prosthesis with supervision / verbal cues.     Time  4    Period  Weeks    Status  On-going    Target Date  07/22/17      PT SHORT TERM GOAL #3   Title  Patient negotiates ramps, curbs with RW & stairs with 2 rails modified technique with supervision.     Time  4    Period  Weeks    Status  On-going    Target Date  07/22/17      PT SHORT TERM GOAL #4   Title  Patient sit to/from stand chairs with armrests without touching support surface to stabilize and engages prothesis with supervision.     Time  4    Period  Weeks    Status  On-going    Target Date  07/22/17      PT SHORT TERM GOAL #5   Title  Patient stands without UE support for 2 minutes with supervision.     Time  4    Period  Weeks    Status  On-going    Target Date  07/22/17        PT Long Term Goals - 06/30/17 1511      PT LONG TERM GOAL #1   Title  Patient demonstrates & verbalizes proper prosthetic care including dialysis issues to enable safe use of prosthesis. (All LTGs Target Date: 09/21/2017)    Time  12    Period  Weeks    Status  On-going    Target Date  09/21/17      PT LONG TERM GOAL #2   Title  Patient tolerates prosthesis wear >90% of awake hours without skin or limb pain issues to enable function throughout his day.     Time  12    Period  Weeks    Status  On-going    Target Date  09/21/17      PT LONG TERM GOAL #3   Title  Berg Balance with device >36/56 to indicate lower fall risk.     Time  12    Period  Weeks  Status  On-going    Target Date  09/21/17      PT LONG TERM GOAL #4   Title  Patient ambulates 1000' outdoors including grass, ramps & curbs with cane or less and prosthesis modified independent.     Time  12    Period  Weeks    Status  On-going    Target Date  09/21/17      PT LONG TERM GOAL #5   Title  Patient modified independent on stairs including descending alternate pattern  with 2 rails and step-to pattern with 1 rail & cane engaging prosthetic hydraulics.     Time  12    Period  Weeks    Status  On-going    Target Date  09/21/17      PT LONG TERM GOAL #6   Title  Patient demonstrates & verbalizes tasks with prosthesis to perform gardening & cooking modified independent to enable return to his hobbies.     Time  12    Period  Weeks    Status  On-going    Target Date  09/21/17            Plan - 06/30/17 1511    Clinical Impression Statement  Today's skilled session focused on proper prosthetic motion to unlock/advance prosthesis, stability in stance in gait and sit to/from stand engaging prosthesis. Prosthetist was able to optimize programing for microprocessor prosthesis while PT assisted pt with gait/ transfers.     Rehab Potential  Good    PT Frequency  2x / week    PT Duration  12 weeks    PT Treatment/Interventions  ADLs/Self Care Home Management;DME Instruction;Gait training;Stair training;Functional mobility training;Therapeutic activities;Therapeutic exercise;Balance training;Neuromuscular re-education;Canalith Repostioning;Patient/family education;Prosthetic Training;Vestibular    PT Next Visit Plan  HEP at sink, pre-gait in parallel bars working on terminal stance prosthetic knee flexion, proper prosthesis advancement & weight shift over prosthesis in stance    Consulted and Agree with Plan of Care  Patient       Patient will benefit from skilled therapeutic intervention in order to improve the following deficits and impairments:  Decreased activity tolerance, Abnormal gait, Decreased balance, Decreased endurance, Decreased knowledge of use of DME, Decreased mobility, Decreased strength, Prosthetic Dependency, Postural dysfunction, Dizziness  Visit Diagnosis: Muscle weakness (generalized)  Other abnormalities of gait and mobility  Abnormal posture     Problem List Patient Active Problem List   Diagnosis Date Noted  . Altered mental  status   . Cerebral thrombosis with cerebral infarction 02/06/2017  . Cerebral embolism with cerebral infarction 02/06/2017  . Above knee amputation status, left ([place])   . Pressure injury of skin 01/30/2017  . Acute lower UTI 01/29/2017  . Acute metabolic encephalopathy 50/09/3816  . UTI (urinary tract infection) 01/29/2017  . Quadriceps muscle rupture, left, initial encounter   . Fall 01/09/2017  . Gait disturbance 01/09/2017  . Staphylococcus aureus infection 01/09/2017  . Infection of prosthetic left knee joint ([place]) 01/09/2017  . Fever, unknown origin 11/09/2016  . Critical lower limb ischemia 08/25/2016  . Ulcer of left midfoot with fat layer exposed ([place]) 08/13/2016  . Diabetic ulcer of left midfoot associated with type 2 diabetes mellitus, with fat layer exposed ([place]) 07/25/2016  . Peripheral neuropathy 07/22/2016  . Tobacco abuse 07/22/2016  . CAD in native artery 05/18/2016  . CAD, multiple vessel 05/11/2016  . Positive cardiac stress test 05/11/2016  . Abnormal stress test 04/30/2016  . Pre-transplant evaluation for kidney transplant 04/30/2016  .  S/P revision of total knee 11/26/2015  . Pain in the chest   . Acute on chronic diastolic heart failure ([place]) 07/05/2015  . Volume overload 07/04/2015  . Shortness of breath 07/04/2015  . Hypoxemia 07/04/2015  . Elevated troponin   . End-stage renal disease on hemodialysis ([place])   . Hypervolemia   . 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 Pyogenic bacterial arthritis of knee, left ([place]) 08/07/2014  . Tachycardia 07/24/2014  . Acute upper respiratory infection 07/24/2014  . ESRD on dialysis ([place]) 07/14/2014  . Type II diabetes mellitus ([place]) 07/14/2014  . Anemia in chronic kidney disease 07/14/2014  . Congestive heart failure (CHF) ([place]) 07/13/2014  . Surgical wound dehiscence 05/09/2014  . Dehiscence of closure of skin 05/09/2014  . Total knee replacement status 04/10/2014  . Diabetes mellitus with renal  manifestations, controlled ([place]) 10/24/2013  . Hypertensive renal disease 06/27/2013  . DM type 2 causing vascular disease ([place]) 06/27/2013  . Erectile dysfunction 06/27/2013  . Depression 06/27/2013  . Claudication of left lower extremity ([place]) 12/19/2012  . Essential hypertension, benign 12/19/2012  . Sinusitis, acute maxillary 11/22/2012  . Otitis, externa, infective 11/14/2012  . Leg edema, left 11/14/2012  . End stage renal disease ([place]) 10/02/2012  . Controlled type 2 DM with proteinuria or microalbuminuria 09/19/2012  . GERD (gastroesophageal reflux disease) 09/19/2012  . Leukocytosis 09/19/2012  . Lacunar infarction ([place]) 08/17/2012  . Polymyalgia rheumatica ([place]) 08/17/2012  . 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 Essential hypertension 05/10/2012  . Vitamin D deficiency 05/10/2012  . Diabetes mellitus due to underlying condition ([place]) 05/10/2012  . Hyperlipidemia LDL goal <100 05/10/2012  . Anemia of chronic disease 05/10/2012  . Screening for prostate cancer 05/10/2012  . Chronic kidney disease (CKD), stage IV (severe) ([place]) 05/10/2012  . Peripheral autonomic neuropathy due to DM ([place]) 05/10/2012  . Callus of foot 05/10/2012  . Urgency of urination 05/10/2012  . Hyperkalemia 05/10/2012  . Candidiasis of the esophagus 10/12/2011  . Internal hemorrhoids without mention of complication 65/68/1275  . Pre-syncope 07/25/2009  . DJD (degenerative joint disease) of cervical spine 02/17/2009    Jamey Reas PT, DPT 07/01/2017, 3:14 PM  [facility] [address] [place], [place], [address] Phone: [phone]   Fax:  [phone]  Name: Matthew Kline MRN: [id number] Date of Birth: [birth year]

## 2017-07-05 ENCOUNTER — Encounter: Payer: Self-pay | Admitting: Physical Therapy

## 2017-07-05 ENCOUNTER — Ambulatory Visit: Payer: Medicare HMO | Admitting: Physical Therapy

## 2017-07-05 DIAGNOSIS — R2681 Unsteadiness on feet: Secondary | ICD-10-CM

## 2017-07-05 DIAGNOSIS — M6281 Muscle weakness (generalized): Secondary | ICD-10-CM

## 2017-07-05 DIAGNOSIS — R2689 Other abnormalities of gait and mobility: Secondary | ICD-10-CM

## 2017-07-05 DIAGNOSIS — R293 Abnormal posture: Secondary | ICD-10-CM

## 2017-07-05 NOTE — Therapy (Signed)
[facility] [address] [place], [place], [address] Phone: [phone]   Fax:  [phone]  Physical Therapy Treatment  Patient Details  Name: Matthew Kline MRN: [id number] Date of Birth: April 18, 1960 Referring Provider: Meridee Score, MD   Encounter Date: 07/05/2017  PT End of Session - 07/05/17 1014    Visit Number  4    Number of Visits  25    Date for PT Re-Evaluation  09/21/17    Authorization Type  [employer]    PT Start Time  0945    PT Stop Time  1018    PT Time Calculation (min)  33 min    Activity Tolerance  Patient tolerated treatment well;Patient limited by fatigue;Patient limited by pain    Behavior During Therapy  [facility] for tasks assessed/performed       Past Medical History:  Diagnosis Date  . Anemia, unspecified   . Anxiety   . Arthralgia 2010   polyarticular  . Arthritis    "back, knees" (01/10/2017)  . Cancer [facility])    "kidney area" (01/10/2017)  . CHF (congestive heart failure) ([place]) 07/25/2009   denies  . Chronic lower back pain   . Coronary artery disease   . Coughing    pt. reports that he has drainage from sinus infection  . Diabetic foot ulcer ([place])   . Diabetic neuropathy ([place])   . ESRD (end stage renal disease) on dialysis [facility])    started 12/2012; "MWF; [place] "  (01/10/2017)  . GERD (gastroesophageal reflux disease)    hx "before I lost weight", no problem 9 years  . Hemodialysis access site with mature fistula ([place])   . Hemorrhoids, internal 10/2011   small  . High cholesterol   . History of blood transfusion    "related to the anemia"  . Hypertension   . Insomnia, unspecified   . Lacunar infarction ([place]) 2006   RUE/RLE, speech  . Long term (current) use of anticoagulants   . Myocardial infarction ([place]) 1995  . Orthostatic hypotension   . Osteomyelitis of foot, left, acute ([place])   . Other chronic postoperative pain   . Pneumonia    "probably twice"  (01/10/2017)  . Polymyalgia rheumatica ([place])   . Renal insufficiency   . Sleep apnea    "lost weight; no more problem" (01/10/2017)  . Stroke ([place]) 01/10/06   denies residual on 05/09/2014  . Type II diabetes mellitus ([place]) dx'd 1995  . Unspecified hereditary and idiopathic peripheral neuropathy    feet  . Unspecified osteomyelitis, site unspecified   . Unspecified vitamin D deficiency     Past Surgical History:  Procedure Laterality Date  . ABDOMINAL AORTOGRAM N/A 08/25/2016   Procedure: ABDOMINAL AORTOGRAM;  Surgeon: Wellington Hampshire, MD;  Location: [place] [facility];  Service: Cardiovascular;  Laterality: N/A;  . AMPUTATION  01/21/2012   Procedure: AMPUTATION RAY;  Surgeon: [name], MD;  Location: [place];  Service: Orthopedics;  Laterality: Left;  Left Foot 4th Ray Amputation  . AMPUTATION Left 05/04/2013   Procedure: AMPUTATION DIGIT;  Surgeon: [name], MD;  Location: [facility];  Service: Orthopedics;  Laterality: Left;  Left Great Toe Amputation at [facility]  . AMPUTATION Left 01/14/2017   Procedure: AMPUTATION ABOVE LEFT KNEE;  Surgeon: [name], MD;  Location: [facility];  Service: Orthopedics;  Laterality: Left;  . ANTERIOR CERVICAL DECOMP/DISCECTOMY FUSION  02/2011  . BACK SURGERY    . BASCILIC  VEIN TRANSPOSITION Left 10/19/2012   Procedure: BASCILIC VEIN TRANSPOSITION;  Surgeon: Serafina Mitchell, MD;  Location: [facility];  Service: Vascular;  Laterality: Left;  . CARDIAC CATHETERIZATION     "before bypass"  . CORONARY ARTERY BYPASS GRAFT     x 5 with lima at [place] WITH ANTIBIOTIC SPACERS Left 08/07/2014   Procedure: Replace Left Total Knee Arthroplasty,  Place Antibiotic Spacer;  Surgeon: [name], MD;  Location: [facility];  Service: Orthopedics;  Laterality: Left;  . I&D EXTREMITY Left 05/09/2014   Procedure: Irrigation and Debridement Left Knee and Closure of Total Knee Arthroplasty Incision;  Surgeon: [name], MD;  Location: [facility];  Service: Orthopedics;  Laterality: Left;  . I&D KNEE WITH POLY EXCHANGE Left 05/31/2014   Procedure: IRRIGATION AND DEBRIDEMENT LEFT KNEE, PLACE ANTIBIOTIC BEADS,  POLY EXCHANGE;  Surgeon: [name], MD;  Location: [facility];  Service: Orthopedics;  Laterality: Left;  . IRRIGATION AND DEBRIDEMENT KNEE Left 01/12/2017   Procedure: IRRIGATION AND DEBRIDEMENT LEFT KNEE;  Surgeon: [name], MD;  Location: [facility];  Service: Orthopedics;  Laterality: Left;  . JOINT REPLACEMENT    . KNEE ARTHROSCOPY Left 08-25-2012  . LOWER EXTREMITY ANGIOGRAPHY Left 08/25/2016   Procedure: Lower Extremity Angiography;  Surgeon: Wellington Hampshire, MD;  Location: [place] [facility];  Service: Cardiovascular;  Laterality: Left;  . PERIPHERAL VASCULAR BALLOON ANGIOPLASTY Left 08/25/2016   Procedure: PERIPHERAL VASCULAR BALLOON ANGIOPLASTY;  Surgeon: Wellington Hampshire, MD;  Location: [place] [facility];  Service: Cardiovascular;  Laterality: Left;  lt peroneal and ant tibial arteries cutting balloon  . REFRACTIVE SURGERY Bilateral   . TOE AMPUTATION Bilateral    "I've lost 7 toes over the last 7 years" (05/09/2014)  . TOE SURGERY Left April 2015   Big toe removed on left foot.  . TONSILLECTOMY    . TOTAL KNEE ARTHROPLASTY Left 04/10/2014   Procedure: TOTAL KNEE ARTHROPLASTY;  Surgeon: [name], MD;  Location: [facility];  Service: Orthopedics;  Laterality: Left;  . TOTAL KNEE REVISION Left 10/25/2014   Procedure: LEFT TOTAL KNEE REVISION;  Surgeon: [name], MD;  Location: [facility];  Service: Orthopedics;  Laterality: Left;  . TOTAL KNEE REVISION Left 11/26/2015   Procedure: Removal Left Total Knee Arthroplasty, Hinged Total Knee Arthroplasty;  Surgeon: [name], MD;  Location: [place] [facility];  Service: Orthopedics;  Laterality: Left;  . UVULOPALATOPHARYNGOPLASTY, TONSILLECTOMY AND SEPTOPLASTY  ~ 1989  . WOUND DEBRIDEMENT Left 05/09/2014   Dehiscence Left Total Knee Arthroplasty Incision    There were no vitals  filed for this visit.  Subjective Assessment - 07/05/17 1000    Subjective  Patient reports wearing prosthesis most of awake hours. He has noticed back pain with standing & gait but subsides when he sits down to rest.  Prosthesis seems better after adjustments that prosthetist made at last PT session.     Pertinent History  Left TFA, ESRD with dialysis M,W,F, CHF, CAD s/p CABG 05/2016, PAD, CVA, DM2, neuropathy, arthritis, right toe amputations    Limitations  Lifting;Standing;Walking;House hold activities    Patient Stated Goals  He wants to use prosthesis to walk without assistance, garden, cook / work in [employer]     Currently in Pain?  Yes    Pain Score  5  ranges from 0-6/10    Pain Location  Back    Pain Orientation  Right;Posterior;Lower;Lateral  Pain Descriptors / Indicators  Aching;Tightness    Pain Type  Acute pain    Pain Onset  1 to 4 weeks ago    Pain Frequency  Intermittent    Aggravating Factors   standing & gait    Pain Relieving Factors  sitting & rest        [facility] Adult PT Treatment/Exercise - 07/05/17 1047      Exercises   Exercises  Other Exercises bil trunk rotation 10 reps 1 set      Knee/Hip Exercises: Standing   Forward Step Up  --    SLS  10 reps; 1 set        PT Education - 07/05/17 1011    Education Details  educated pt on the differences of forearm and axillary crutches. Pt intructred with sink HEP    Person(s) Educated  Patient    Methods  Explanation;Demonstration;Handout;Verbal cues;Tactile cues    Comprehension  Verbalized understanding;Returned demonstration;Verbal cues required;Need further instruction       PT Short Term Goals - 06/30/17 1510      PT SHORT TERM GOAL #1   Title  Patient verbalizes proper method to weigh prosthesis & weigh-in / weigh-out with dialysis. (All STGs Target Date: 07/22/2017)    Time  4    Period  Weeks    Status  On-going    Target Date  07/22/17      PT SHORT TERM GOAL #2   Title  Patient ambulates 300' with  RW or crutches engaging prosthesis with supervision / verbal cues.     Time  4    Period  Weeks    Status  On-going    Target Date  07/22/17      PT SHORT TERM GOAL #3   Title  Patient negotiates ramps, curbs with RW & stairs with 2 rails modified technique with supervision.     Time  4    Period  Weeks    Status  On-going    Target Date  07/22/17      PT SHORT TERM GOAL #4   Title  Patient sit to/from stand chairs with armrests without touching support surface to stabilize and engages prothesis with supervision.     Time  4    Period  Weeks    Status  On-going    Target Date  07/22/17      PT SHORT TERM GOAL #5   Title  Patient stands without UE support for 2 minutes with supervision.     Time  4    Period  Weeks    Status  On-going    Target Date  07/22/17        PT Long Term Goals - 06/30/17 1511      PT LONG TERM GOAL #1   Title  Patient demonstrates & verbalizes proper prosthetic care including dialysis issues to enable safe use of prosthesis. (All LTGs Target Date: 09/21/2017)    Time  12    Period  Weeks    Status  On-going    Target Date  09/21/17      PT LONG TERM GOAL #2   Title  Patient tolerates prosthesis wear >90% of awake hours without skin or limb pain issues to enable function throughout his day.     Time  12    Period  Weeks    Status  On-going    Target Date  09/21/17      PT LONG TERM GOAL #  3   Title  Oceanographer with device >36/56 to indicate lower fall risk.     Time  12    Period  Weeks    Status  On-going    Target Date  09/21/17      PT LONG TERM GOAL #4   Title  Patient ambulates 1000' outdoors including grass, ramps & curbs with cane or less and prosthesis modified independent.     Time  12    Period  Weeks    Status  On-going    Target Date  09/21/17      PT LONG TERM GOAL #5   Title  Patient modified independent on stairs including descending alternate pattern with 2 rails and step-to pattern with 1 rail & cane engaging  prosthetic hydraulics.     Time  12    Period  Weeks    Status  On-going    Target Date  09/21/17      PT LONG TERM GOAL #6   Title  Patient demonstrates & verbalizes tasks with prosthesis to perform gardening & cooking modified independent to enable return to his hobbies.     Time  12    Period  Weeks    Status  On-going    Target Date  09/21/17            Plan - 07/05/17 1015    Clinical Impression Statement  Today's skilled session focused on patient in sink HEP to increasing weight bearing, proprioception, standing balance & strength with standing on prosthesis. Patient seems to understand recommendation for forearm crutches to progress gait but needs training prior to use.     Rehab Potential  Good    PT Frequency  2x / week    PT Duration  12 weeks    PT Next Visit Plan  prosthetic gait with forearm crutches including stairs 1 rail/1 crutch and ramps/curbs. Standing balance activities    Consulted and Agree with Plan of Care  Patient       Patient will benefit from skilled therapeutic intervention in order to improve the following deficits and impairments:  Decreased activity tolerance, Abnormal gait, Decreased balance, Decreased endurance, Decreased knowledge of use of DME, Decreased mobility, Decreased strength, Prosthetic Dependency, Postural dysfunction, Dizziness  Visit Diagnosis: Muscle weakness (generalized)  Abnormal posture  Other abnormalities of gait and mobility  Unsteadiness on feet     Problem List Patient Active Problem List   Diagnosis Date Noted  . Altered mental status   . Cerebral thrombosis with cerebral infarction 02/06/2017  . Cerebral embolism with cerebral infarction 02/06/2017  . Above knee amputation status, left ([place])   . Pressure injury of skin 01/30/2017  . Acute lower UTI 01/29/2017  . Acute metabolic encephalopathy 73/42/8768  . UTI (urinary tract infection) 01/29/2017  . Quadriceps muscle rupture, left, initial encounter    . Fall 01/09/2017  . Gait disturbance 01/09/2017  . Staphylococcus aureus infection 01/09/2017  . Infection of prosthetic left knee joint ([place]) 01/09/2017  . Fever, unknown origin 11/09/2016  . Critical lower limb ischemia 08/25/2016  . Ulcer of left midfoot with fat layer exposed ([place]) 08/13/2016  . Diabetic ulcer of left midfoot associated with type 2 diabetes mellitus, with fat layer exposed ([place]) 07/25/2016  . Peripheral neuropathy 07/22/2016  . Tobacco abuse 07/22/2016  . CAD in native artery 05/18/2016  . CAD, multiple vessel 05/11/2016  . Positive cardiac stress test 05/11/2016  . Abnormal stress test 04/30/2016  . Pre-transplant  evaluation for kidney transplant 04/30/2016  . S/P revision of total knee 11/26/2015  . Pain in the chest   . Acute on chronic diastolic heart failure ([place]) 07/05/2015  . Volume overload 07/04/2015  . Shortness of breath 07/04/2015  . Hypoxemia 07/04/2015  . Elevated troponin   . End-stage renal disease on hemodialysis ([place])   . Hypervolemia   . 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 Pyogenic bacterial arthritis of knee, left ([place]) 08/07/2014  . Tachycardia 07/24/2014  . Acute upper respiratory infection 07/24/2014  . ESRD on dialysis ([place]) 07/14/2014  . Type II diabetes mellitus ([place]) 07/14/2014  . Anemia in chronic kidney disease 07/14/2014  . Congestive heart failure (CHF) ([place]) 07/13/2014  . Surgical wound dehiscence 05/09/2014  . Dehiscence of closure of skin 05/09/2014  . Total knee replacement status 04/10/2014  . Diabetes mellitus with renal manifestations, controlled ([place]) 10/24/2013  . Hypertensive renal disease 06/27/2013  . DM type 2 causing vascular disease ([place]) 06/27/2013  . Erectile dysfunction 06/27/2013  . Depression 06/27/2013  . Claudication of left lower extremity ([place]) 12/19/2012  . Essential hypertension, benign 12/19/2012  . Sinusitis, acute maxillary 11/22/2012  . Otitis, externa, infective  11/14/2012  . Leg edema, left 11/14/2012  . End stage renal disease ([place]) 10/02/2012  . Controlled type 2 DM with proteinuria or microalbuminuria 09/19/2012  . GERD (gastroesophageal reflux disease) 09/19/2012  . Leukocytosis 09/19/2012  . Lacunar infarction ([place]) 08/17/2012  . Polymyalgia rheumatica ([place]) 08/17/2012  . 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 Essential hypertension 05/10/2012  . Vitamin D deficiency 05/10/2012  . Diabetes mellitus due to underlying condition ([place]) 05/10/2012  . Hyperlipidemia LDL goal <100 05/10/2012  . Anemia of chronic disease 05/10/2012  . Screening for prostate cancer 05/10/2012  . Chronic kidney disease (CKD), stage IV (severe) ([place]) 05/10/2012  . Peripheral autonomic neuropathy due to DM ([place]) 05/10/2012  . Callus of foot 05/10/2012  . Urgency of urination 05/10/2012  . Hyperkalemia 05/10/2012  . Candidiasis of the esophagus 10/12/2011  . Internal hemorrhoids without mention of complication 21/30/8657  . Pre-syncope 07/25/2009  . DJD (degenerative joint disease) of cervical spine 02/17/2009   Halina Andreas, SPTA 07/05/2017, 11:07 AM  Jamey Reas, PT, DPT 07/05/2017,  12:25 PM  [facility] [address] [place], [place], [address] Phone: [phone]   Fax:  [phone]  Name: Matthew Kline MRN: [id number] Date of Birth: [birth year]

## 2017-07-05 NOTE — Patient Instructions (Addendum)
Do each exercise 1-2  times per day Do each exercise 10 repetitions Hold each exercise for 2 seconds to feel your location  AT [place].  USE TAPE ON FLOOR TO MARK THE MIDLINE POSITION. You also should try to feel with your limb pressure in socket.  You are trying to feel with limb what you used to feel with the bottom of your foot.  1. Side to Side Shift: Moving your hips only (not shoulders): move weight onto your left leg, HOLD/FEEL.  Move back to equal weight on each leg, HOLD/FEEL. Move weight onto your right leg, HOLD/FEEL. Move back to equal weight on each leg, HOLD/FEEL. Repeat. 2. Front to Back Shift: Moving your hips only (not shoulders): move your weight forward onto your toes, HOLD/FEEL. Move your weight back to equal Flat Foot on both legs, HOLD/FEEL. Move your weight back onto your heels, HOLD/FEEL. Move your weight back to equal on both legs, HOLD/FEEL. Repeat. 3. Moving Cones / Cups: With equal weight on each leg: Hold on with one hand the first time, then progress to no hand supports. Move cups from one side of sink to the other. Place cups ~2" out of your reach, progress to 10" beyond reach. 4. Overhead/Upward Reaching: alternated reaching up to top cabinets or ceiling if no cabinets present. Keep equal weight on each leg. Start with one hand support on counter while other hand reaches and progress to no hand support with reaching. 5.   Looking Over Shoulders: With equal weight on each leg: alternate turning to look          over your shoulders with one hand support on counter as needed. Shift weight to             side looking, pull hip then shoulder then head/eyes around to look behind you. Start       with one hand support & progress to no hand support.Sink HEP to improve balance and increase stance time on prosthesis. Finding middle line; side to side weight shift holding for 2 secs; Anterior Step-Up    Place  foot under sink. Step onto step with right foot without pushing off with the ground foot. Finish with ground foot in touch balance on step and return 10 times. 1 sets 1-2 times per day.  http://gglj.exer.us/160   Copyright  VHI. All rights reserved.

## 2017-07-07 ENCOUNTER — Ambulatory Visit: Payer: Medicare HMO | Admitting: Physical Therapy

## 2017-07-07 ENCOUNTER — Encounter: Payer: Self-pay | Admitting: Physical Therapy

## 2017-07-07 DIAGNOSIS — R2689 Other abnormalities of gait and mobility: Secondary | ICD-10-CM

## 2017-07-07 DIAGNOSIS — R293 Abnormal posture: Secondary | ICD-10-CM

## 2017-07-07 DIAGNOSIS — M6281 Muscle weakness (generalized): Secondary | ICD-10-CM

## 2017-07-07 DIAGNOSIS — R2681 Unsteadiness on feet: Secondary | ICD-10-CM

## 2017-07-07 NOTE — Therapy (Signed)
[facility] [address] [place], [place], [address] Phone: [phone]   Fax:  [phone]  Physical Therapy Treatment  Patient Details  Name: Matthew Kline MRN: [id number] Date of Birth: May 10, 1960 Referring Provider: Meridee Score, MD   Encounter Date: 07/07/2017  PT End of Session - 07/07/17 0806    Visit Number  5    Number of Visits  25    Date for PT Re-Evaluation  09/21/17    Authorization Type  [employer]    PT Start Time  0804    PT Stop Time  0844    PT Time Calculation (min)  40 min    Equipment Utilized During Treatment  Gait belt    Activity Tolerance  Patient tolerated treatment well    Behavior During Therapy  [facility] for tasks assessed/performed       Past Medical History:  Diagnosis Date  . Anemia, unspecified   . Anxiety   . Arthralgia 2010   polyarticular  . Arthritis    "back, knees" (01/10/2017)  . Cancer [facility])    "kidney area" (01/10/2017)  . CHF (congestive heart failure) ([place]) 07/25/2009   denies  . Chronic lower back pain   . Coronary artery disease   . Coughing    pt. reports that he has drainage from sinus infection  . Diabetic foot ulcer ([place])   . Diabetic neuropathy ([place])   . ESRD (end stage renal disease) on dialysis [facility])    started 12/2012; "MWF; [facility] "  (01/10/2017)  . GERD (gastroesophageal reflux disease)    hx "before I lost weight", no problem 9 years  . Hemodialysis access site with mature fistula ([place])   . Hemorrhoids, internal 10/2011   small  . High cholesterol   . History of blood transfusion    "related to the anemia"  . Hypertension   . Insomnia, unspecified   . Lacunar infarction ([place]) 2006   RUE/RLE, speech  . Long term (current) use of anticoagulants   . Myocardial infarction ([place]) 1995  . Orthostatic hypotension   . Osteomyelitis of foot, left, acute ([place])   . Other chronic postoperative pain   . Pneumonia    "probably twice"  (01/10/2017)  . Polymyalgia rheumatica ([place])   . Renal insufficiency   . Sleep apnea    "lost weight; no more problem" (01/10/2017)  . Stroke ([place]) 01/10/06   denies residual on 05/09/2014  . Type II diabetes mellitus ([place]) dx'd 1995  . Unspecified hereditary and idiopathic peripheral neuropathy    feet  . Unspecified osteomyelitis, site unspecified   . Unspecified vitamin D deficiency     Past Surgical History:  Procedure Laterality Date  . ABDOMINAL AORTOGRAM N/A 08/25/2016   Procedure: ABDOMINAL AORTOGRAM;  Surgeon: Wellington [place], MD;  Location: [place] [facility];  Service: Cardiovascular;  Laterality: N/A;  . AMPUTATION  01/21/2012   Procedure: AMPUTATION RAY;  Surgeon: [name], MD;  Location: [place];  Service: Orthopedics;  Laterality: Left;  Left Foot 4th Ray Amputation  . AMPUTATION Left 05/04/2013   Procedure: AMPUTATION DIGIT;  Surgeon: [name], MD;  Location: [place];  Service: Orthopedics;  Laterality: Left;  Left Great Toe Amputation at [facility]  . AMPUTATION Left 01/14/2017   Procedure: AMPUTATION ABOVE LEFT KNEE;  Surgeon: [name], MD;  Location: [place];  Service: Orthopedics;  Laterality: Left;  . ANTERIOR CERVICAL DECOMP/DISCECTOMY FUSION  02/2011  . BACK SURGERY    .  BASCILIC VEIN TRANSPOSITION Left 10/19/2012   Procedure: BASCILIC VEIN TRANSPOSITION;  Surgeon: Serafina [place], MD;  Location: [place];  Service: Vascular;  Laterality: Left;  . CARDIAC CATHETERIZATION     "before bypass"  . CORONARY ARTERY BYPASS GRAFT     x 5 with lima at [place] WITH ANTIBIOTIC SPACERS Left 08/07/2014   Procedure: Replace Left Total Knee Arthroplasty,  Place Antibiotic Spacer;  Surgeon: [name], MD;  Location: [place];  Service: Orthopedics;  Laterality: Left;  . I&D EXTREMITY Left 05/09/2014   Procedure: Irrigation and Debridement Left Knee and Closure of Total Knee Arthroplasty Incision;  Surgeon: [name], MD;  Location: [place];  Service: Orthopedics;  Laterality: Left;  . I&D KNEE WITH POLY EXCHANGE Left 05/31/2014   Procedure: IRRIGATION AND DEBRIDEMENT LEFT KNEE, PLACE ANTIBIOTIC BEADS,  POLY EXCHANGE;  Surgeon: [name], MD;  Location: [place];  Service: Orthopedics;  Laterality: Left;  . IRRIGATION AND DEBRIDEMENT KNEE Left 01/12/2017   Procedure: IRRIGATION AND DEBRIDEMENT LEFT KNEE;  Surgeon: [name], MD;  Location: [place];  Service: Orthopedics;  Laterality: Left;  . JOINT REPLACEMENT    . KNEE ARTHROSCOPY Left 08-25-2012  . LOWER EXTREMITY ANGIOGRAPHY Left 08/25/2016   Procedure: Lower Extremity Angiography;  Surgeon: Wellington Hampshire, MD;  Location: [place] [facility];  Service: Cardiovascular;  Laterality: Left;  . PERIPHERAL VASCULAR BALLOON ANGIOPLASTY Left 08/25/2016   Procedure: PERIPHERAL VASCULAR BALLOON ANGIOPLASTY;  Surgeon: Wellington Hampshire, MD;  Location: [place] [facility];  Service: Cardiovascular;  Laterality: Left;  lt peroneal and ant tibial arteries cutting balloon  . REFRACTIVE SURGERY Bilateral   . TOE AMPUTATION Bilateral    "I've lost 7 toes over the last 7 years" (05/09/2014)  . TOE SURGERY Left April 2015   Big toe removed on left foot.  . TONSILLECTOMY    . TOTAL KNEE ARTHROPLASTY Left 04/10/2014   Procedure: TOTAL KNEE ARTHROPLASTY;  Surgeon: [name], MD;  Location: [place];  Service: Orthopedics;  Laterality: Left;  . TOTAL KNEE REVISION Left 10/25/2014   Procedure: LEFT TOTAL KNEE REVISION;  Surgeon: [name], MD;  Location: [place];  Service: Orthopedics;  Laterality: Left;  . TOTAL KNEE REVISION Left 11/26/2015   Procedure: Removal Left Total Knee Arthroplasty, Hinged Total Knee Arthroplasty;  Surgeon: [name], MD;  Location: [place];  Service: Orthopedics;  Laterality: Left;  . UVULOPALATOPHARYNGOPLASTY, TONSILLECTOMY AND SEPTOPLASTY  ~ 1989  . WOUND DEBRIDEMENT Left 05/09/2014   Dehiscence Left Total Knee Arthroplasty Incision    There were no vitals  filed for this visit.  Subjective Assessment - 07/07/17 0805    Subjective  No new complaints. No falls or pain to report.     Pertinent History  Left TFA, ESRD with dialysis M,W,F, CHF, CAD s/p CABG 05/2016, PAD, CVA, DM2, neuropathy, arthritis, right toe amputations    Limitations  Lifting;Standing;Walking;House hold activities    Patient Stated Goals  He wants to use prosthesis to walk without assistance, garden, cook / work in [employer]     Currently in Pain?  No/denies    Pain Score  0-No pain           [facility] Adult PT Treatment/Exercise - 07/07/17 0807      Transfers   Transfers  Sit to Stand;Stand to Sit    Sit to Stand  5: Supervision;With upper extremity assist;With armrests;From chair/3-in-1  Sit to Stand Details  Tactile cues for weight shifting;Visual cues for safe use of DME/AE;Verbal cues for technique;Verbal cues for safe use of DME/AE    Stand to Sit  5: Supervision;With upper extremity assist;With armrests;To chair/3-in-1    Stand to Sit Details (indicate cue type and reason)  Tactile cues for weight shifting;Visual cues for safe use of DME/AE;Verbal cues for technique;Verbal cues for safe use of DME/AE    Comments  cues needed to engage hydraulics of prosthesis, for safety when fatigued and technique when using forearm crutches.       Ambulation/Gait   Ambulation/Gait  Yes    Ambulation/Gait Assistance  5: Supervision;4: Min guard;4: Min assist    Ambulation/Gait Assistance Details  supervision with RW; min guard to min assist with forearm crutches with cues on crutch placement, sequencing and posture. cues for weight shifitng and use of prosthetic knee with gait.     Ambulation Distance (Feet)  100 Feet x2 RW, 130 x1 forearm crutches    Assistive device  Rolling walker;Prosthesis;Lofstrands    Gait Pattern  Step-to pattern;Decreased step length - right;Decreased stance time - left;Decreased stride length;Decreased hip/knee flexion - left;Decreased weight shift to  left;Left circumduction;Left hip hike;Antalgic;Lateral hip instability;Trunk flexed;Decreased trunk rotation;Abducted - left;Poor foot clearance - left    Ambulation Surface  Level;Indoor    Stairs  Yes    Stairs Assistance  4: Min guard;4: Min assist    Stairs Assistance Details (indicate cue type and reason)  worked on using prosthesis to ride hydrualics down stairs. max cues/facilitation needed    Stair Management Technique  Two rails;Step to pattern;Forwards    Number of Stairs  4    Ramp  Other (comment) min guard assist    Ramp Details (indicate cue type and reason)  x3 reps with RW/prosthesis, cues on sequencing and to engage hydrualics of knee      Neuro Re-ed    Neuro Re-ed Details   to facilitate left weight bearing/hydraulic knee engagement: sit<>stand x 10 reps with emphasis on equal LE weight bearing/use of prosthetic hydrualics with sitting; mini squats with chair behind him for safety- cues for equal LE weight bearing, to only lower to just before give point of hydrualics and egagement of hydraulics to return to standing. at this time he needs to step the prosthesis, then weight bear to engage hydrualics, was done this manner with each rep.        Prosthetics   Current prosthetic wear tolerance (days/week)   daily    Current prosthetic wear tolerance (#hours/day)   reports all awake hours with a break somedays after dialysis for napping    Residual limb condition   intact per pt report    Education Provided  Residual limb care;Proper wear schedule/adjustment;Proper weight-bearing schedule/adjustment    Person(s) Educated  Patient    Education Method  Explanation;Demonstration;Verbal cues    Education Method  Verbalized understanding;Returned demonstration;Verbal cues required;Needs further instruction    Donning Prosthesis  Supervision    Doffing Prosthesis  Supervision           PT Short Term Goals - 06/30/17 1510      PT SHORT TERM GOAL #1   Title  Patient verbalizes  proper method to weigh prosthesis & weigh-in / weigh-out with dialysis. (All STGs Target Date: 07/22/2017)    Time  4    Period  Weeks    Status  On-going    Target Date  07/22/17  PT SHORT TERM GOAL #2   Title  Patient ambulates 300' with RW or crutches engaging prosthesis with supervision / verbal cues.     Time  4    Period  Weeks    Status  On-going    Target Date  07/22/17      PT SHORT TERM GOAL #3   Title  Patient negotiates ramps, curbs with RW & stairs with 2 rails modified technique with supervision.     Time  4    Period  Weeks    Status  On-going    Target Date  07/22/17      PT SHORT TERM GOAL #4   Title  Patient sit to/from stand chairs with armrests without touching support surface to stabilize and engages prothesis with supervision.     Time  4    Period  Weeks    Status  On-going    Target Date  07/22/17      PT SHORT TERM GOAL #5   Title  Patient stands without UE support for 2 minutes with supervision.     Time  4    Period  Weeks    Status  On-going    Target Date  07/22/17        PT Long Term Goals - 06/30/17 1511      PT LONG TERM GOAL #1   Title  Patient demonstrates & verbalizes proper prosthetic care including dialysis issues to enable safe use of prosthesis. (All LTGs Target Date: 09/21/2017)    Time  12    Period  Weeks    Status  On-going    Target Date  09/21/17      PT LONG TERM GOAL #2   Title  Patient tolerates prosthesis wear >90% of awake hours without skin or limb pain issues to enable function throughout his day.     Time  12    Period  Weeks    Status  On-going    Target Date  09/21/17      PT LONG TERM GOAL #3   Title  Berg Balance with device >36/56 to indicate lower fall risk.     Time  12    Period  Weeks    Status  On-going    Target Date  09/21/17      PT LONG TERM GOAL #4   Title  Patient ambulates 1000' outdoors including grass, ramps & curbs with cane or less and prosthesis modified independent.     Time  12     Period  Weeks    Status  On-going    Target Date  09/21/17      PT LONG TERM GOAL #5   Title  Patient modified independent on stairs including descending alternate pattern with 2 rails and step-to pattern with 1 rail & cane engaging prosthetic hydraulics.     Time  12    Period  Weeks    Status  On-going    Target Date  09/21/17      PT LONG TERM GOAL #6   Title  Patient demonstrates & verbalizes tasks with prosthesis to perform gardening & cooking modified independent to enable return to his hobbies.     Time  12    Period  Weeks    Status  On-going    Target Date  09/21/17            Plan - 07/07/17 0807    Clinical Impression Statement  Today's  skilled session continued to focus on use of prosthesis with gait/barriers with emphasis on engagement of hydrualic knee. Pt needs continued work for weight shifting and engagement of hydraulic knee. Pt did good with forearm crutches today with cues needecd through out on sequencing. Pt was educated on "hand bell" exercise to work on [place] and took the cards home to practice at home as well. Pt is progressing toward goals and should benefit from continued PT to progress toward unmet goals.     Rehab Potential  Good    PT Frequency  2x / week    PT Duration  12 weeks    PT Next Visit Plan  prosthetic gait with forearm crutches including stairs 1 rail/1 crutch and ramps/curbs. Standing balance activities, work to engage hydraulic knee with all activities.     Consulted and Agree with Plan of Care  Patient       Patient will benefit from skilled therapeutic intervention in order to improve the following deficits and impairments:  Decreased activity tolerance, Abnormal gait, Decreased balance, Decreased endurance, Decreased knowledge of use of DME, Decreased mobility, Decreased strength, Prosthetic Dependency, Postural dysfunction, Dizziness  Visit Diagnosis: Muscle weakness (generalized)  Abnormal posture  Other abnormalities  of gait and mobility  Unsteadiness on feet     Problem List Patient Active Problem List   Diagnosis Date Noted  . Altered mental status   . Cerebral thrombosis with cerebral infarction 02/06/2017  . Cerebral embolism with cerebral infarction 02/06/2017  . Above knee amputation status, left ([place])   . Pressure injury of skin 01/30/2017  . Acute lower UTI 01/29/2017  . Acute metabolic encephalopathy 43/32/9518  . UTI (urinary tract infection) 01/29/2017  . Quadriceps muscle rupture, left, initial encounter   . Fall 01/09/2017  . Gait disturbance 01/09/2017  . Staphylococcus aureus infection 01/09/2017  . Infection of prosthetic left knee joint ([place]) 01/09/2017  . Fever, unknown origin 11/09/2016  . Critical lower limb ischemia 08/25/2016  . Ulcer of left midfoot with fat layer exposed ([place]) 08/13/2016  . Diabetic ulcer of left midfoot associated with type 2 diabetes mellitus, with fat layer exposed ([place]) 07/25/2016  . Peripheral neuropathy 07/22/2016  . Tobacco abuse 07/22/2016  . CAD in native artery 05/18/2016  . CAD, multiple vessel 05/11/2016  . Positive cardiac stress test 05/11/2016  . Abnormal stress test 04/30/2016  . Pre-transplant evaluation for kidney transplant 04/30/2016  . S/P revision of total knee 11/26/2015  . Pain in the chest   . Acute on chronic diastolic heart failure ([place]) 07/05/2015  . Volume overload 07/04/2015  . Shortness of breath 07/04/2015  . Hypoxemia 07/04/2015  . Elevated troponin   . End-stage renal disease on hemodialysis ([place])   . Hypervolemia   . 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 Pyogenic bacterial arthritis of knee, left ([place]) 08/07/2014  . Tachycardia 07/24/2014  . Acute upper respiratory infection 07/24/2014  . ESRD on dialysis ([place]) 07/14/2014  . Type II diabetes mellitus ([place]) 07/14/2014  . Anemia in chronic kidney disease 07/14/2014  . Congestive heart failure (CHF) ([place]) 07/13/2014  . Surgical wound  dehiscence 05/09/2014  . Dehiscence of closure of skin 05/09/2014  . Total knee replacement status 04/10/2014  . Diabetes mellitus with renal manifestations, controlled ([place]) 10/24/2013  . Hypertensive renal disease 06/27/2013  . DM type 2 causing vascular disease ([place]) 06/27/2013  . Erectile dysfunction 06/27/2013  . Depression 06/27/2013  . Claudication of left lower extremity ([place]) 12/19/2012  . Essential  hypertension, benign 12/19/2012  . Sinusitis, acute maxillary 11/22/2012  . Otitis, externa, infective 11/14/2012  . Leg edema, left 11/14/2012  . End stage renal disease ([place]) 10/02/2012  . Controlled type 2 DM with proteinuria or microalbuminuria 09/19/2012  . GERD (gastroesophageal reflux disease) 09/19/2012  . Leukocytosis 09/19/2012  . Lacunar infarction ([place]) 08/17/2012  . Polymyalgia rheumatica ([place]) 08/17/2012  . 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 Essential hypertension 05/10/2012  . Vitamin D deficiency 05/10/2012  . Diabetes mellitus due to underlying condition ([place]) 05/10/2012  . Hyperlipidemia LDL goal <100 05/10/2012  . Anemia of chronic disease 05/10/2012  . Screening for prostate cancer 05/10/2012  . Chronic kidney disease (CKD), stage IV (severe) ([place]) 05/10/2012  . Peripheral autonomic neuropathy due to DM ([place]) 05/10/2012  . Callus of foot 05/10/2012  . Urgency of urination 05/10/2012  . Hyperkalemia 05/10/2012  . Candidiasis of the esophagus 10/12/2011  . Internal hemorrhoids without mention of complication 82/50/5397  . Pre-syncope 07/25/2009  . DJD (degenerative joint disease) of cervical spine 02/17/2009    Willow Ora, PTA, [facility] [address], [place], [place] [address] [phone] 07/08/17, 1:47 PM   Name: Mykell Rawl MRN: [id number] Date of Birth: 25-Jul-1960

## 2017-07-12 ENCOUNTER — Ambulatory Visit: Payer: Medicare HMO | Attending: Orthopedic Surgery | Admitting: Physical Therapy

## 2017-07-12 ENCOUNTER — Encounter: Payer: Self-pay | Admitting: Physical Therapy

## 2017-07-12 ENCOUNTER — Telehealth: Payer: Self-pay | Admitting: Physical Therapy

## 2017-07-12 DIAGNOSIS — M6281 Muscle weakness (generalized): Secondary | ICD-10-CM | POA: Insufficient documentation

## 2017-07-12 DIAGNOSIS — R293 Abnormal posture: Secondary | ICD-10-CM | POA: Insufficient documentation

## 2017-07-12 DIAGNOSIS — R2689 Other abnormalities of gait and mobility: Secondary | ICD-10-CM | POA: Insufficient documentation

## 2017-07-12 DIAGNOSIS — R2681 Unsteadiness on feet: Secondary | ICD-10-CM | POA: Diagnosis present

## 2017-07-12 NOTE — Telephone Encounter (Addendum)
Dr. Sharol Given,  Matthew Kline is being treated by physical therapy for gait and balance training with his prosthesis.  He will benefit from use bilateral forearm crutches in order to improve safety with functional mobility.    If you agree, please submit request in EPIC under referral for DME (list bilateral forearm (loftstand) crutches in comments) or fax to [facility] at [phone].   Thank you,  Willow Ora, PTA, [facility] [address] [place], [place] [address] [phone] 07/12/17, 8:42 PM   [facility] [address] [place], [place]  [address] Phone:  [phone] Fax:  [phone]

## 2017-07-12 NOTE — Therapy (Signed)
[facility] [address] [place], [place], [address] Phone: [phone]   Fax:  [phone]  Physical Therapy Treatment  Patient Details  Name: Matthew Kline MRN: [id number] Date of Birth: 06-27-1960 Referring Provider: Meridee Score, MD   Encounter Date: 07/12/2017  PT End of Session - 07/12/17 0809    Visit Number  6    Number of Visits  25    Date for PT Re-Evaluation  09/21/17    Authorization Type  [employer]    PT Start Time  0805    PT Stop Time  0845    PT Time Calculation (min)  40 min    Equipment Utilized During Treatment  Gait belt    Activity Tolerance  Patient tolerated treatment well    Behavior During Therapy  [facility] for tasks assessed/performed       Past Medical History:  Diagnosis Date  . Anemia, unspecified   . Anxiety   . Arthralgia 2010   polyarticular  . Arthritis    "back, knees" (01/10/2017)  . Cancer [facility])    "kidney area" (01/10/2017)  . CHF (congestive heart failure) ([place]) 07/25/2009   denies  . Chronic lower back pain   . Coronary artery disease   . Coughing    pt. reports that he has drainage from sinus infection  . Diabetic foot ulcer ([place])   . Diabetic neuropathy ([place])   . ESRD (end stage renal disease) on dialysis [facility])    started 12/2012; "MWF; [place] "  (01/10/2017)  . GERD (gastroesophageal reflux disease)    hx "before I lost weight", no problem 9 years  . Hemodialysis access site with mature fistula ([place])   . Hemorrhoids, internal 10/2011   small  . High cholesterol   . History of blood transfusion    "related to the anemia"  . Hypertension   . Insomnia, unspecified   . Lacunar infarction ([place]) 2006   RUE/RLE, speech  . Long term (current) use of anticoagulants   . Myocardial infarction ([place]) 1995  . Orthostatic hypotension   . Osteomyelitis of foot, left, acute ([place])   . Other chronic postoperative pain   . Pneumonia    "probably twice"  (01/10/2017)  . Polymyalgia rheumatica ([place])   . Renal insufficiency   . Sleep apnea    "lost weight; no more problem" (01/10/2017)  . Stroke ([place]) 01/10/06   denies residual on 05/09/2014  . Type II diabetes mellitus ([place]) dx'd 1995  . Unspecified hereditary and idiopathic peripheral neuropathy    feet  . Unspecified osteomyelitis, site unspecified   . Unspecified vitamin D deficiency     Past Surgical History:  Procedure Laterality Date  . ABDOMINAL AORTOGRAM N/A 08/25/2016   Procedure: ABDOMINAL AORTOGRAM;  Surgeon: Wellington Hampshire, MD;  Location: [facility];  Service: Cardiovascular;  Laterality: N/A;  . AMPUTATION  01/21/2012   Procedure: AMPUTATION RAY;  Surgeon: [name], MD;  Location: [facility];  Service: Orthopedics;  Laterality: Left;  Left Foot 4th Ray Amputation  . AMPUTATION Left 05/04/2013   Procedure: AMPUTATION DIGIT;  Surgeon: [name], MD;  Location: [facility];  Service: Orthopedics;  Laterality: Left;  Left Great Toe Amputation at [facility]  . AMPUTATION Left 01/14/2017   Procedure: AMPUTATION ABOVE LEFT KNEE;  Surgeon: [name], MD;  Location: [facility];  Service: Orthopedics;  Laterality: Left;  . ANTERIOR CERVICAL DECOMP/DISCECTOMY FUSION  02/2011  . BACK SURGERY    .  BASCILIC VEIN TRANSPOSITION Left 10/19/2012   Procedure: BASCILIC VEIN TRANSPOSITION;  Surgeon: Serafina Mitchell, MD;  Location: [facility];  Service: Vascular;  Laterality: Left;  . CARDIAC CATHETERIZATION     "before bypass"  . CORONARY ARTERY BYPASS GRAFT     x 5 with lima at [place] WITH ANTIBIOTIC SPACERS Left 08/07/2014   Procedure: Replace Left Total Knee Arthroplasty,  Place Antibiotic Spacer;  Surgeon: [name], MD;  Location: [facility];  Service: Orthopedics;  Laterality: Left;  . I&D EXTREMITY Left 05/09/2014   Procedure: Irrigation and Debridement Left Knee and Closure of Total Knee Arthroplasty Incision;  Surgeon: [name], MD;  Location: [facility];  Service: Orthopedics;  Laterality: Left;  . I&D KNEE WITH POLY EXCHANGE Left 05/31/2014   Procedure: IRRIGATION AND DEBRIDEMENT LEFT KNEE, PLACE ANTIBIOTIC BEADS,  POLY EXCHANGE;  Surgeon: [name], MD;  Location: [facility];  Service: Orthopedics;  Laterality: Left;  . IRRIGATION AND DEBRIDEMENT KNEE Left 01/12/2017   Procedure: IRRIGATION AND DEBRIDEMENT LEFT KNEE;  Surgeon: [name], MD;  Location: [facility];  Service: Orthopedics;  Laterality: Left;  . JOINT REPLACEMENT    . KNEE ARTHROSCOPY Left 08-25-2012  . LOWER EXTREMITY ANGIOGRAPHY Left 08/25/2016   Procedure: Lower Extremity Angiography;  Surgeon: Wellington Hampshire, MD;  Location: [facility];  Service: Cardiovascular;  Laterality: Left;  . PERIPHERAL VASCULAR BALLOON ANGIOPLASTY Left 08/25/2016   Procedure: PERIPHERAL VASCULAR BALLOON ANGIOPLASTY;  Surgeon: Wellington Hampshire, MD;  Location: [place] [facility];  Service: Cardiovascular;  Laterality: Left;  lt peroneal and ant tibial arteries cutting balloon  . REFRACTIVE SURGERY Bilateral   . TOE AMPUTATION Bilateral    "I've lost 7 toes over the last 7 years" (05/09/2014)  . TOE SURGERY Left April 2015   Big toe removed on left foot.  . TONSILLECTOMY    . TOTAL KNEE ARTHROPLASTY Left 04/10/2014   Procedure: TOTAL KNEE ARTHROPLASTY;  Surgeon: [name], MD;  Location: [place] [facility];  Service: Orthopedics;  Laterality: Left;  . TOTAL KNEE REVISION Left 10/25/2014   Procedure: LEFT TOTAL KNEE REVISION;  Surgeon: [name], MD;  Location: [facility];  Service: Orthopedics;  Laterality: Left;  . TOTAL KNEE REVISION Left 11/26/2015   Procedure: Removal Left Total Knee Arthroplasty, Hinged Total Knee Arthroplasty;  Surgeon: [name], MD;  Location: [facility];  Service: Orthopedics;  Laterality: Left;  . UVULOPALATOPHARYNGOPLASTY, TONSILLECTOMY AND SEPTOPLASTY  ~ 1989  . WOUND DEBRIDEMENT Left 05/09/2014   Dehiscence Left Total Knee Arthroplasty Incision    There were no vitals  filed for this visit.  Subjective Assessment - 07/12/17 0806    Subjective  No new complaints. No falls  to report. Does report his BP keeps dropping at dialysis and neuropathic pain in left foot, "it feels like it is cramping". Did have  near fall at the gas pump when he turned wrong and tripped over the lines. His prosthesis "caught him" and stopped the fall.     Pertinent History  Left TFA, ESRD with dialysis M,W,F, CHF, CAD s/p CABG 05/2016, PAD, CVA, DM2, neuropathy, arthritis, right toe amputations    Limitations  Lifting;Standing;Walking;House hold activities    Patient Stated Goals  He wants to use prosthesis to walk without assistance, garden, cook / work in [employer]     Currently in Pain?  Yes    Pain Location  Leg  Pain Orientation  Left    Pain Descriptors / Indicators  Aching;Cramping    Pain Type  Neuropathic pain;Phantom pain    Pain Onset  1 to 4 weeks ago    Pain Frequency  Intermittent    Aggravating Factors   standing and gait    Pain Relieving Factors  sitting and rest         [facility] Adult PT Treatment/Exercise - 07/12/17 0809      Transfers   Transfers  Sit to Stand;Stand to Sit    Sit to Stand  5: Supervision;With upper extremity assist;With armrests;From chair/3-in-1    Sit to Stand Details  Verbal cues for sequencing;Verbal cues for safe use of DME/AE    Stand to Sit  5: Supervision;With upper extremity assist;With armrests;To chair/3-in-1    Stand to Sit Details (indicate cue type and reason)  Verbal cues for technique;Verbal cues for safe use of DME/AE      Ambulation/Gait   Ambulation/Gait  Yes    Ambulation/Gait Assistance  4: Min guard    Ambulation/Gait Assistance Details  min guard with bouts of min assist at times (less than 2) for gait with forearm crutches. cues needed at times for gait pattern. pt did progress from 4 point gait pattern to 2 point gait pattern with prosthetic knee flexion with swing phase. pt tends to get off sequence with  turns/obstacle negotiation. no significant balance issues noted.     Ambulation Distance (Feet)  100 Feet x1 RW, 115 x2,, 230 x1 with FA crutches    Assistive device  Rolling walker;Prosthesis;Lofstrands    Gait Pattern  Step-to pattern;Decreased step length - right;Decreased stance time - left;Decreased stride length;Decreased hip/knee flexion - left;Decreased weight shift to left;Left circumduction;Left hip hike;Antalgic;Lateral hip instability;Trunk flexed;Decreased trunk rotation;Abducted - left;Poor foot clearance - left    Ambulation Surface  Level;Indoor    Stairs  Yes    Stairs Assistance  4: Min guard    Stairs Assistance Details (indicate cue type and reason)  cues needed on sequencing, weight shifting over the prosthesis and to advance hands along rails.     Stair Management Technique  One rail Right;One rail Left;Step to pattern;Forwards;With crutches    Number of Stairs  4 x2      Prosthetics   Current prosthetic wear tolerance (days/week)   daily    Current prosthetic wear tolerance (#hours/day)   reports all awake hours with a break somedays after dialysis for napping    Residual limb condition   intact per pt report    Education Provided  Proper wear schedule/adjustment;Proper weight-bearing schedule/adjustment;Residual limb care    Person(s) Educated  Patient    Education Method  Explanation;Demonstration;Verbal cues    Education Method  Verbalized understanding;Returned demonstration;Verbal cues required;Needs further instruction    Donning Prosthesis  Supervision    Doffing Prosthesis  Supervision          PT Short Term Goals - 06/30/17 1510      PT SHORT TERM GOAL #1   Title  Patient verbalizes proper method to weigh prosthesis & weigh-in / weigh-out with dialysis. (All STGs Target Date: 07/22/2017)    Time  4    Period  Weeks    Status  On-going    Target Date  07/22/17      PT SHORT TERM GOAL #2   Title  Patient ambulates 300' with RW or crutches engaging  prosthesis with supervision / verbal cues.     Time  4  Period  Weeks    Status  On-going    Target Date  07/22/17      PT SHORT TERM GOAL #3   Title  Patient negotiates ramps, curbs with RW & stairs with 2 rails modified technique with supervision.     Time  4    Period  Weeks    Status  On-going    Target Date  07/22/17      PT SHORT TERM GOAL #4   Title  Patient sit to/from stand chairs with armrests without touching support surface to stabilize and engages prothesis with supervision.     Time  4    Period  Weeks    Status  On-going    Target Date  07/22/17      PT SHORT TERM GOAL #5   Title  Patient stands without UE support for 2 minutes with supervision.     Time  4    Period  Weeks    Status  On-going    Target Date  07/22/17        PT Long Term Goals - 06/30/17 1511      PT LONG TERM GOAL #1   Title  Patient demonstrates & verbalizes proper prosthetic care including dialysis issues to enable safe use of prosthesis. (All LTGs Target Date: 09/21/2017)    Time  12    Period  Weeks    Status  On-going    Target Date  09/21/17      PT LONG TERM GOAL #2   Title  Patient tolerates prosthesis wear >90% of awake hours without skin or limb pain issues to enable function throughout his day.     Time  12    Period  Weeks    Status  On-going    Target Date  09/21/17      PT LONG TERM GOAL #3   Title  Berg Balance with device >36/56 to indicate lower fall risk.     Time  12    Period  Weeks    Status  On-going    Target Date  09/21/17      PT LONG TERM GOAL #4   Title  Patient ambulates 1000' outdoors including grass, ramps & curbs with cane or less and prosthesis modified independent.     Time  12    Period  Weeks    Status  On-going    Target Date  09/21/17      PT LONG TERM GOAL #5   Title  Patient modified independent on stairs including descending alternate pattern with 2 rails and step-to pattern with 1 rail & cane engaging prosthetic hydraulics.      Time  12    Period  Weeks    Status  On-going    Target Date  09/21/17      PT LONG TERM GOAL #6   Title  Patient demonstrates & verbalizes tasks with prosthesis to perform gardening & cooking modified independent to enable return to his hobbies.     Time  12    Period  Weeks    Status  On-going    Target Date  09/21/17            Plan - 07/12/17 0809    Clinical Impression Statement  Today's skilled session continued to focus on engagement of pt's prosthesis with gait/barriers using forearm crutches. Pt is progressing with use of these with less cues needed on sequencing and initiation of stairs today.  Will need skilled instruction on use with ramps, curbs and outdoor surfaces before he would be safe to use them in the community. Will send MD request for forearm crutches as well. Pt should benefit from continued PT to progress toward unmet goals.                                 Rehab Potential  Good    PT Frequency  2x / week    PT Duration  12 weeks    PT Next Visit Plan  prosthetic gait with forearm crutches including stairs 1 rail/1 crutch and ramps/curbs. Gait outside with forearm crutches. Standing balance activities, work to engage hydraulic knee with all activities.     Consulted and Agree with Plan of Care  Patient       Patient will benefit from skilled therapeutic intervention in order to improve the following deficits and impairments:  Decreased activity tolerance, Abnormal gait, Decreased balance, Decreased endurance, Decreased knowledge of use of DME, Decreased mobility, Decreased strength, Prosthetic Dependency, Postural dysfunction, Dizziness  Visit Diagnosis: Muscle weakness (generalized)  Abnormal posture  Other abnormalities of gait and mobility  Unsteadiness on feet     Problem List Patient Active Problem List   Diagnosis Date Noted  . Altered mental status   . Cerebral thrombosis with cerebral infarction 02/06/2017  . Cerebral embolism with  cerebral infarction 02/06/2017  . Above knee amputation status, left ([place])   . Pressure injury of skin 01/30/2017  . Acute lower UTI 01/29/2017  . Acute metabolic encephalopathy 97/98/9211  . UTI (urinary tract infection) 01/29/2017  . Quadriceps muscle rupture, left, initial encounter   . Fall 01/09/2017  . Gait disturbance 01/09/2017  . Staphylococcus aureus infection 01/09/2017  . Infection of prosthetic left knee joint ([place]) 01/09/2017  . Fever, unknown origin 11/09/2016  . Critical lower limb ischemia 08/25/2016  . Ulcer of left midfoot with fat layer exposed ([place]) 08/13/2016  . Diabetic ulcer of left midfoot associated with type 2 diabetes mellitus, with fat layer exposed ([place]) 07/25/2016  . Peripheral neuropathy 07/22/2016  . Tobacco abuse 07/22/2016  . CAD in native artery 05/18/2016  . CAD, multiple vessel 05/11/2016  . Positive cardiac stress test 05/11/2016  . Abnormal stress test 04/30/2016  . Pre-transplant evaluation for kidney transplant 04/30/2016  . S/P revision of total knee 11/26/2015  . Pain in the chest   . Acute on chronic diastolic heart failure ([place]) 07/05/2015  . Volume overload 07/04/2015  . Shortness of breath 07/04/2015  . Hypoxemia 07/04/2015  . Elevated troponin   . End-stage renal disease on hemodialysis ([place])   . Hypervolemia   . 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 Pyogenic bacterial arthritis of knee, left ([place]) 08/07/2014  . Tachycardia 07/24/2014  . Acute upper respiratory infection 07/24/2014  . ESRD on dialysis ([place]) 07/14/2014  . Type II diabetes mellitus ([place]) 07/14/2014  . Anemia in chronic kidney disease 07/14/2014  . Congestive heart failure (CHF) ([place]) 07/13/2014  . Surgical wound dehiscence 05/09/2014  . Dehiscence of closure of skin 05/09/2014  . Total knee replacement status 04/10/2014  . Diabetes mellitus with renal manifestations, controlled ([place]) 10/24/2013  . Hypertensive renal disease 06/27/2013  . DM  type 2 causing vascular disease ([place]) 06/27/2013  . Erectile dysfunction 06/27/2013  . Depression 06/27/2013  . Claudication of left lower extremity ([place]) 12/19/2012  . Essential hypertension, benign 12/19/2012  . Sinusitis,  acute maxillary 11/22/2012  . Otitis, externa, infective 11/14/2012  . Leg edema, left 11/14/2012  . End stage renal disease ([place]) 10/02/2012  . Controlled type 2 DM with proteinuria or microalbuminuria 09/19/2012  . GERD (gastroesophageal reflux disease) 09/19/2012  . Leukocytosis 09/19/2012  . Lacunar infarction ([place]) 08/17/2012  . Polymyalgia rheumatica ([place]) 08/17/2012  . 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 Essential hypertension 05/10/2012  . Vitamin D deficiency 05/10/2012  . Diabetes mellitus due to underlying condition ([place]) 05/10/2012  . Hyperlipidemia LDL goal <100 05/10/2012  . Anemia of chronic disease 05/10/2012  . Screening for prostate cancer 05/10/2012  . Chronic kidney disease (CKD), stage IV (severe) ([place]) 05/10/2012  . Peripheral autonomic neuropathy due to DM ([place]) 05/10/2012  . Callus of foot 05/10/2012  . Urgency of urination 05/10/2012  . Hyperkalemia 05/10/2012  . Candidiasis of the esophagus 10/12/2011  . Internal hemorrhoids without mention of complication 02/21/1733  . Pre-syncope 07/25/2009  . DJD (degenerative joint disease) of cervical spine 02/17/2009    Willow Ora, PTA, [facility] [address] [place], [place] [address] [phone] 07/12/17, 7:42 PM   Name: Matthew Kline MRN: [id number] Date of Birth: 24-Dec-1960

## 2017-07-13 ENCOUNTER — Other Ambulatory Visit (INDEPENDENT_AMBULATORY_CARE_PROVIDER_SITE_OTHER): Payer: Self-pay | Admitting: Orthopedic Surgery

## 2017-07-13 DIAGNOSIS — IMO0002 Reserved for concepts with insufficient information to code with codable children: Secondary | ICD-10-CM

## 2017-07-15 ENCOUNTER — Ambulatory Visit: Payer: Medicare HMO | Admitting: Physical Therapy

## 2017-07-19 ENCOUNTER — Ambulatory Visit: Payer: Medicare HMO | Admitting: Physical Therapy

## 2017-07-21 ENCOUNTER — Ambulatory Visit: Payer: Medicare HMO | Admitting: Physical Therapy

## 2017-07-21 ENCOUNTER — Encounter: Payer: Self-pay | Admitting: Physical Therapy

## 2017-07-21 DIAGNOSIS — R293 Abnormal posture: Secondary | ICD-10-CM

## 2017-07-21 DIAGNOSIS — R2689 Other abnormalities of gait and mobility: Secondary | ICD-10-CM

## 2017-07-21 DIAGNOSIS — R2681 Unsteadiness on feet: Secondary | ICD-10-CM

## 2017-07-21 DIAGNOSIS — M6281 Muscle weakness (generalized): Secondary | ICD-10-CM | POA: Diagnosis not present

## 2017-07-21 NOTE — Therapy (Signed)
[facility] [address] [place], [place], [address] Phone: [phone]   Fax:  [phone]  Physical Therapy Treatment  Patient Details  Name: Matthew Kline MRN: [id number] Date of Birth: 1960-09-02 Referring Provider: Meridee Score, MD   Encounter Date: 07/21/2017  PT End of Session - 07/21/17 0851    Visit Number  7    Number of Visits  25    Date for PT Re-Evaluation  09/21/17    Authorization Type  [employer]    PT Start Time  [phone]    PT Stop Time  0930    PT Time Calculation (min)  44 min    Equipment Utilized During Treatment  Gait belt    Activity Tolerance  Patient tolerated treatment well    Behavior During Therapy  [facility] for tasks assessed/performed       Past Medical History:  Diagnosis Date  . Anemia, unspecified   . Anxiety   . Arthralgia 2010   polyarticular  . Arthritis    "back, knees" (01/10/2017)  . Cancer [facility])    "kidney area" (01/10/2017)  . CHF (congestive heart failure) ([place]) 07/25/2009   denies  . Chronic lower back pain   . Coronary artery disease   . Coughing    pt. reports that he has drainage from sinus infection  . Diabetic foot ulcer ([place])   . Diabetic neuropathy ([place])   . ESRD (end stage renal disease) on dialysis [facility])    started 12/2012; "MWF; [place] "  (01/10/2017)  . GERD (gastroesophageal reflux disease)    hx "before I lost weight", no problem 9 years  . Hemodialysis access site with mature fistula ([place])   . Hemorrhoids, internal 10/2011   small  . High cholesterol   . History of blood transfusion    "related to the anemia"  . Hypertension   . Insomnia, unspecified   . Lacunar infarction ([place]) 2006   RUE/RLE, speech  . Long term (current) use of anticoagulants   . Myocardial infarction ([place]) 1995  . Orthostatic hypotension   . Osteomyelitis of foot, left, acute ([place])   . Other chronic postoperative pain   . Pneumonia    "probably twice"  (01/10/2017)  . Polymyalgia rheumatica ([place])   . Renal insufficiency   . Sleep apnea    "lost weight; no more problem" (01/10/2017)  . Stroke ([place]) 01/10/06   denies residual on 05/09/2014  . Type II diabetes mellitus ([place]) dx'd 1995  . Unspecified hereditary and idiopathic peripheral neuropathy    feet  . Unspecified osteomyelitis, site unspecified   . Unspecified vitamin D deficiency     Past Surgical History:  Procedure Laterality Date  . ABDOMINAL AORTOGRAM N/A 08/25/2016   Procedure: ABDOMINAL AORTOGRAM;  Surgeon: Wellington Hampshire, MD;  Location: [facility];  Service: Cardiovascular;  Laterality: N/A;  . AMPUTATION  01/21/2012   Procedure: AMPUTATION RAY;  Surgeon: [name], MD;  Location: [facility];  Service: Orthopedics;  Laterality: Left;  Left Foot 4th Ray Amputation  . AMPUTATION Left 05/04/2013   Procedure: AMPUTATION DIGIT;  Surgeon: [name], MD;  Location: [facility];  Service: Orthopedics;  Laterality: Left;  Left Great Toe Amputation at [facility]  . AMPUTATION Left 01/14/2017   Procedure: AMPUTATION ABOVE LEFT KNEE;  Surgeon: [name], MD;  Location: [facility];  Service: Orthopedics;  Laterality: Left;  . ANTERIOR CERVICAL DECOMP/DISCECTOMY FUSION  02/2011  . BACK SURGERY    .  BASCILIC VEIN TRANSPOSITION Left 10/19/2012   Procedure: BASCILIC VEIN TRANSPOSITION;  Surgeon: Serafina Mitchell, MD;  Location: [facility];  Service: Vascular;  Laterality: Left;  . CARDIAC CATHETERIZATION     "before bypass"  . CORONARY ARTERY BYPASS GRAFT     x 5 with lima at [place] WITH ANTIBIOTIC SPACERS Left 08/07/2014   Procedure: Replace Left Total Knee Arthroplasty,  Place Antibiotic Spacer;  Surgeon: [name], MD;  Location: [facility];  Service: Orthopedics;  Laterality: Left;  . I&D EXTREMITY Left 05/09/2014   Procedure: Irrigation and Debridement Left Knee and Closure of Total Knee Arthroplasty Incision;  Surgeon: [name], MD;  Location: [facility];  Service: Orthopedics;  Laterality: Left;  . I&D KNEE WITH POLY EXCHANGE Left 05/31/2014   Procedure: IRRIGATION AND DEBRIDEMENT LEFT KNEE, PLACE ANTIBIOTIC BEADS,  POLY EXCHANGE;  Surgeon: [name], MD;  Location: [facility];  Service: Orthopedics;  Laterality: Left;  . IRRIGATION AND DEBRIDEMENT KNEE Left 01/12/2017   Procedure: IRRIGATION AND DEBRIDEMENT LEFT KNEE;  Surgeon: [name], MD;  Location: [facility];  Service: Orthopedics;  Laterality: Left;  . JOINT REPLACEMENT    . KNEE ARTHROSCOPY Left 08-25-2012  . LOWER EXTREMITY ANGIOGRAPHY Left 08/25/2016   Procedure: Lower Extremity Angiography;  Surgeon: Wellington Hampshire, MD;  Location: [place];  Service: Cardiovascular;  Laterality: Left;  . PERIPHERAL VASCULAR BALLOON ANGIOPLASTY Left 08/25/2016   Procedure: PERIPHERAL VASCULAR BALLOON ANGIOPLASTY;  Surgeon: Wellington Hampshire, MD;  Location: [place] [facility];  Service: Cardiovascular;  Laterality: Left;  lt peroneal and ant tibial arteries cutting balloon  . REFRACTIVE SURGERY Bilateral   . TOE AMPUTATION Bilateral    "I've lost 7 toes over the last 7 years" (05/09/2014)  . TOE SURGERY Left April 2015   Big toe removed on left foot.  . TONSILLECTOMY    . TOTAL KNEE ARTHROPLASTY Left 04/10/2014   Procedure: TOTAL KNEE ARTHROPLASTY;  Surgeon: [name], MD;  Location: [facility];  Service: Orthopedics;  Laterality: Left;  . TOTAL KNEE REVISION Left 10/25/2014   Procedure: LEFT TOTAL KNEE REVISION;  Surgeon: [name], MD;  Location: [place];  Service: Orthopedics;  Laterality: Left;  . TOTAL KNEE REVISION Left 11/26/2015   Procedure: Removal Left Total Knee Arthroplasty, Hinged Total Knee Arthroplasty;  Surgeon: [name], MD;  Location: [facility];  Service: Orthopedics;  Laterality: Left;  . UVULOPALATOPHARYNGOPLASTY, TONSILLECTOMY AND SEPTOPLASTY  ~ 1989  . WOUND DEBRIDEMENT Left 05/09/2014   Dehiscence Left Total Knee Arthroplasty Incision    There were no vitals  filed for this visit.  Subjective Assessment - 07/21/17 0850    Subjective  No new complaints. No falls  to report. Reports he has a little pinching of loose skin at the top of his socket.     Pertinent History  Left TFA, ESRD with dialysis M,W,F, CHF, CAD s/p CABG 05/2016, PAD, CVA, DM2, neuropathy, arthritis, right toe amputations    Limitations  Lifting;Standing;Walking;House hold activities    Patient Stated Goals  He wants to use prosthesis to walk without assistance, garden, cook / work in [employer]     Currently in Pain?  No/denies    Pain Onset  1 to 4 weeks ago    Multiple Pain Sites  No         [facility] Adult PT Treatment/Exercise - 07/21/17 1019      Ambulation/Gait  Ambulation/Gait  Yes    Ambulation/Gait Assistance  4: Min guard    Ambulation/Gait Assistance Details  Min guard and 2 point/4 point gait pattern mix, VCs for sequencing and reminders to pause and regroup; no significant balance issues noted. Cues needed for posture, to widen base of support of crutches and for weight shifting onto prosthesis.    Ambulation Distance (Feet)  400 Feet    Assistive device  R Forearm Crutch;L Forearm Crutch    Gait Pattern  Step-to pattern;Decreased step length - right;Decreased stance time - left;Decreased stride length;Decreased hip/knee flexion - left;Decreased weight shift to left;Left circumduction;Left hip hike;Antalgic;Lateral hip instability;Trunk flexed;Decreased trunk rotation;Abducted - left;Poor foot clearance - left    Ambulation Surface  Level;Indoor    Stairs  Yes    Stairs Assistance  4: Min guard    Stairs Assistance Details (indicate cue type and reason)  Pt. need VCs to swing prosthesis back and out enough to clear the step on the ascent and to take the crutch down before he stepped on the descent. Performed with right rail/crutch x 1 rep, left rail/crutch x 1 rep   Stair Management Technique  One rail Right;One rail Left;With crutches;Other (comment) One rail, one  forearm crutch    Number of Stairs  4 x 2 reps   Ramp  Other (comment) min assist   Ramp Details (indicate cue type and reason) Cues needed for sequencing, posture and weight shifting   Curb  Other (comment) Min-Mod assist due to experimentation with crutch placement. Performed multiple reps with 6 inch curb working on finding pt's optimal crutch placement for balance. Pt did best with both crutches up on step before advancing prosthesis up. Cues needed to "kick" prosthesis back/out to prevent foot from getting caught on overhang of curb.         Prosthetics   Current prosthetic wear tolerance (days/week)   daily    Current prosthetic wear tolerance (#hours/day)   reports all awake hours with a break somedays after dialysis for napping    Current prosthetic weight-bearing tolerance (hours/day)   Pt is shifting wieght to prosthesis but complained of pinching in his groin area. Once he was able to readjust it and use his underwear as an additional barrier, he reported that the pinching had lessened. Pt was advised to bring socks to next session for education on use and donning as he most likely is bottoming out in socket due to new onset of pinching/discomfort of socket at groin area.    Residual limb condition   intact per pt report    Education Provided  Proper wear schedule/adjustment;Proper weight-bearing schedule/adjustment;Residual limb care    Person(s) Educated  Patient    Education Method  Explanation    Education Method  Verbalized understanding    Donning Prosthesis  Independent    Doffing Prosthesis  Independent      Ambulation   Ambulation/Gait Assistance Details  Verbal cues for sequencing;Verbal cues for technique;Verbal cues for gait pattern        PT Short Term Goals - 06/30/17 1510      PT SHORT TERM GOAL #1   Title  Patient verbalizes proper method to weigh prosthesis & weigh-in / weigh-out with dialysis. (All STGs Target Date: 07/22/2017)    Time  4    Period  Weeks     Status  On-going    Target Date  07/22/17      PT SHORT TERM GOAL #2   Title  Patient ambulates 300' with RW or crutches engaging prosthesis with supervision / verbal cues.     Time  4    Period  Weeks    Status  On-going    Target Date  07/22/17      PT SHORT TERM GOAL #3   Title  Patient negotiates ramps, curbs with RW & stairs with 2 rails modified technique with supervision.     Time  4    Period  Weeks    Status  On-going    Target Date  07/22/17      PT SHORT TERM GOAL #4   Title  Patient sit to/from stand chairs with armrests without touching support surface to stabilize and engages prothesis with supervision.     Time  4    Period  Weeks    Status  On-going    Target Date  07/22/17      PT SHORT TERM GOAL #5   Title  Patient stands without UE support for 2 minutes with supervision.     Time  4    Period  Weeks    Status  On-going    Target Date  07/22/17        PT Long Term Goals - 06/30/17 1511      PT LONG TERM GOAL #1   Title  Patient demonstrates & verbalizes proper prosthetic care including dialysis issues to enable safe use of prosthesis. (All LTGs Target Date: 09/21/2017)    Time  12    Period  Weeks    Status  On-going    Target Date  09/21/17      PT LONG TERM GOAL #2   Title  Patient tolerates prosthesis wear >90% of awake hours without skin or limb pain issues to enable function throughout his day.     Time  12    Period  Weeks    Status  On-going    Target Date  09/21/17      PT LONG TERM GOAL #3   Title  Berg Balance with device >36/56 to indicate lower fall risk.     Time  12    Period  Weeks    Status  On-going    Target Date  09/21/17      PT LONG TERM GOAL #4   Title  Patient ambulates 1000' outdoors including grass, ramps & curbs with cane or less and prosthesis modified independent.     Time  12    Period  Weeks    Status  On-going    Target Date  09/21/17      PT LONG TERM GOAL #5   Title  Patient modified independent on  stairs including descending alternate pattern with 2 rails and step-to pattern with 1 rail & cane engaging prosthetic hydraulics.     Time  12    Period  Weeks    Status  On-going    Target Date  09/21/17      PT LONG TERM GOAL #6   Title  Patient demonstrates & verbalizes tasks with prosthesis to perform gardening & cooking modified independent to enable return to his hobbies.     Time  12    Period  Weeks    Status  On-going    Target Date  09/21/17        Plan - 07/21/17 0851    Clinical Impression Statement  Today's session focused on gait with forearm crutches over level surfaces, ramps, and curbs,  and using one crutch with stairs with 1 rail. Pt. would benefit from continued PT to help pt. adjust to his forearm crutches for which he recieved the script today and to work toward any unmet goals.     Rehab Potential  Good    PT Frequency  2x / week    PT Duration  12 weeks    PT Next Visit Plan  prosthetic gait with forearm crutches including ramps/curbs. Gait outside with forearm crutches. Standing balance activities, work to engage hydraulic knee with all activities. Adjust pt's forearm crutches as necessary if he brings them to session.    Consulted and Agree with Plan of Care  Patient       Pt will benefit from skilled therapeutic intervention in order to improve on the following deficits Decreased activity tolerance;Abnormal gait;Decreased balance;Decreased endurance;Decreased knowledge of use of DME;Decreased mobility;Decreased strength;Prosthetic Dependency;Postural dysfunction;Dizziness  PT Treatment/Interventions ADLs/Self Care Home Management;DME Instruction;Gait training;Stair training;Functional mobility training;Therapeutic activities;Therapeutic exercise;Balance training;Neuromuscular re-education;Canalith Repostioning;Patient/family education;Prosthetic Training;Vestibular   Patient will benefit from skilled therapeutic intervention in order to improve the following  deficits and impairments:  Decreased activity tolerance, Abnormal gait, Decreased balance, Decreased endurance, Decreased knowledge of use of DME, Decreased mobility, Decreased strength, Prosthetic Dependency, Postural dysfunction, Dizziness  Visit Diagnosis: Muscle weakness (generalized)  Abnormal posture  Other abnormalities of gait and mobility  Unsteadiness on feet     Problem List Patient Active Problem List   Diagnosis Date Noted  . Altered mental status   . Cerebral thrombosis with cerebral infarction 02/06/2017  . Cerebral embolism with cerebral infarction 02/06/2017  . Above knee amputation status, left ([place])   . Pressure injury of skin 01/30/2017  . Acute lower UTI 01/29/2017  . Acute metabolic encephalopathy 94/76/5465  . UTI (urinary tract infection) 01/29/2017  . Quadriceps muscle rupture, left, initial encounter   . Fall 01/09/2017  . Gait disturbance 01/09/2017  . Staphylococcus aureus infection 01/09/2017  . Infection of prosthetic left knee joint ([place]) 01/09/2017  . Fever, unknown origin 11/09/2016  . Critical lower limb ischemia 08/25/2016  . Ulcer of left midfoot with fat layer exposed ([place]) 08/13/2016  . Diabetic ulcer of left midfoot associated with type 2 diabetes mellitus, with fat layer exposed ([place]) 07/25/2016  . Peripheral neuropathy 07/22/2016  . Tobacco abuse 07/22/2016  . CAD in native artery 05/18/2016  . CAD, multiple vessel 05/11/2016  . Positive cardiac stress test 05/11/2016  . Abnormal stress test 04/30/2016  . Pre-transplant evaluation for kidney transplant 04/30/2016  . S/P revision of total knee 11/26/2015  . Pain in the chest   . Acute on chronic diastolic heart failure ([place]) 07/05/2015  . Volume overload 07/04/2015  . Shortness of breath 07/04/2015  . Hypoxemia 07/04/2015  . Elevated troponin   . End-stage renal disease on hemodialysis ([place])   . Hypervolemia   . 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 Pyogenic  bacterial arthritis of knee, left ([place]) 08/07/2014  . Tachycardia 07/24/2014  . Acute upper respiratory infection 07/24/2014  . ESRD on dialysis ([place]) 07/14/2014  . Type II diabetes mellitus ([place]) 07/14/2014  . Anemia in chronic kidney disease 07/14/2014  . Congestive heart failure (CHF) ([place]) 07/13/2014  . Surgical wound dehiscence 05/09/2014  . Dehiscence of closure of skin 05/09/2014  . Total knee replacement status 04/10/2014  . Diabetes mellitus with renal manifestations, controlled ([place]) 10/24/2013  . Hypertensive renal disease 06/27/2013  . DM type 2 causing vascular disease ([place]) 06/27/2013  . Erectile dysfunction  06/27/2013  . Depression 06/27/2013  . Claudication of left lower extremity ([place]) 12/19/2012  . Essential hypertension, benign 12/19/2012  . Sinusitis, acute maxillary 11/22/2012  . Otitis, externa, infective 11/14/2012  . Leg edema, left 11/14/2012  . End stage renal disease ([place]) 10/02/2012  . Controlled type 2 DM with proteinuria or microalbuminuria 09/19/2012  . GERD (gastroesophageal reflux disease) 09/19/2012  . Leukocytosis 09/19/2012  . Lacunar infarction ([place]) 08/17/2012  . Polymyalgia rheumatica ([place]) 08/17/2012  . 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 Essential hypertension 05/10/2012  . Vitamin D deficiency 05/10/2012  . Diabetes mellitus due to underlying condition ([place]) 05/10/2012  . Hyperlipidemia LDL goal <100 05/10/2012  . Anemia of chronic disease 05/10/2012  . Screening for prostate cancer 05/10/2012  . Chronic kidney disease (CKD), stage IV (severe) ([place]) 05/10/2012  . Peripheral autonomic neuropathy due to DM ([place]) 05/10/2012  . Callus of foot 05/10/2012  . Urgency of urination 05/10/2012  . Hyperkalemia 05/10/2012  . Candidiasis of the esophagus 10/12/2011  . Internal hemorrhoids without mention of complication 56/70/1410  . Pre-syncope 07/25/2009  . DJD (degenerative joint disease) of cervical spine 02/17/2009    Arthor Captain,  SPTA 07/21/2017, 10:36 AM  [facility] [address] [place], [place], [address] Phone: [phone]   Fax:  [phone]  Name: Matthew Kline MRN: [id number] Date of Birth: [birth year]  This note has been reviewed and edited by supervising CI. Additional details to treatment flow sheet and missing information from Plan section of note.   Willow Ora, PTA, [facility] [address], [place], [place] [address] [phone] 07/23/17, 12:58 PM

## 2017-07-26 ENCOUNTER — Ambulatory Visit: Payer: Medicare HMO | Admitting: Physical Therapy

## 2017-07-28 ENCOUNTER — Ambulatory Visit: Payer: Medicare HMO | Admitting: Physical Therapy

## 2017-08-02 ENCOUNTER — Encounter: Payer: Self-pay | Admitting: Physical Therapy

## 2017-08-02 ENCOUNTER — Ambulatory Visit: Payer: Medicare HMO | Admitting: Physical Therapy

## 2017-08-02 DIAGNOSIS — R293 Abnormal posture: Secondary | ICD-10-CM

## 2017-08-02 DIAGNOSIS — R2681 Unsteadiness on feet: Secondary | ICD-10-CM

## 2017-08-02 DIAGNOSIS — M6281 Muscle weakness (generalized): Secondary | ICD-10-CM

## 2017-08-02 DIAGNOSIS — R2689 Other abnormalities of gait and mobility: Secondary | ICD-10-CM

## 2017-08-02 NOTE — Therapy (Addendum)
[facility] [address] [place], [place], [address] Phone: [phone]   Fax:  [phone]  Physical Therapy Treatment  Patient Details  Name: Matthew Kline MRN: [id number] Date of Birth: Feb 28, 1960 Referring Provider: Meridee Score, MD   Encounter Date: 08/02/2017  PT End of Session - 08/02/17 1024    Visit Number  8    Number of Visits  25    Date for PT Re-Evaluation  09/21/17    Authorization Type  [employer]    PT Start Time  1016    PT Stop Time  1100    PT Time Calculation (min)  44 min    Equipment Utilized During Treatment  Gait belt    Activity Tolerance  Patient tolerated treatment well    Behavior During Therapy  [facility] for tasks assessed/performed       Past Medical History:  Diagnosis Date  . Anemia, unspecified   . Anxiety   . Arthralgia 2010   polyarticular  . Arthritis    "back, knees" (01/10/2017)  . Cancer [facility])    "kidney area" (01/10/2017)  . CHF (congestive heart failure) ([place]) 07/25/2009   denies  . Chronic lower back pain   . Coronary artery disease   . Coughing    pt. reports that he has drainage from sinus infection  . Diabetic foot ulcer ([place])   . Diabetic neuropathy ([place])   . ESRD (end stage renal disease) on dialysis [facility])    started 12/2012; "MWF; [place] "  (01/10/2017)  . GERD (gastroesophageal reflux disease)    hx "before I lost weight", no problem 9 years  . Hemodialysis access site with mature fistula ([place])   . Hemorrhoids, internal 10/2011   small  . High cholesterol   . History of blood transfusion    "related to the anemia"  . Hypertension   . Insomnia, unspecified   . Lacunar infarction ([place]) 2006   RUE/RLE, speech  . Long term (current) use of anticoagulants   . Myocardial infarction ([place]) 1995  . Orthostatic hypotension   . Osteomyelitis of foot, left, acute ([place])   . Other chronic postoperative pain   . Pneumonia    "probably twice"  (01/10/2017)  . Polymyalgia rheumatica ([place])   . Renal insufficiency   . Sleep apnea    "lost weight; no more problem" (01/10/2017)  . Stroke ([place]) 01/10/06   denies residual on 05/09/2014  . Type II diabetes mellitus ([place]) dx'd 1995  . Unspecified hereditary and idiopathic peripheral neuropathy    feet  . Unspecified osteomyelitis, site unspecified   . Unspecified vitamin D deficiency     Past Surgical History:  Procedure Laterality Date  . ABDOMINAL AORTOGRAM N/A 08/25/2016   Procedure: ABDOMINAL AORTOGRAM;  Surgeon: Wellington Hampshire, MD;  Location: [place] CV LAB;  Service: Cardiovascular;  Laterality: N/A;  . AMPUTATION  01/21/2012   Procedure: AMPUTATION RAY;  Surgeon: [name], MD;  Location: [place];  Service: Orthopedics;  Laterality: Left;  Left Foot 4th Ray Amputation  . AMPUTATION Left 05/04/2013   Procedure: AMPUTATION DIGIT;  Surgeon: [name], MD;  Location: [facility];  Service: Orthopedics;  Laterality: Left;  Left Great Toe Amputation at [facility]  . AMPUTATION Left 01/14/2017   Procedure: AMPUTATION ABOVE LEFT KNEE;  Surgeon: [name], MD;  Location: [place];  Service: Orthopedics;  Laterality: Left;  . ANTERIOR CERVICAL DECOMP/DISCECTOMY FUSION  02/2011  . BACK SURGERY    .  BASCILIC VEIN TRANSPOSITION Left 10/19/2012   Procedure: BASCILIC VEIN TRANSPOSITION;  Surgeon: Serafina Mitchell, MD;  Location: [facility];  Service: Vascular;  Laterality: Left;  . CARDIAC CATHETERIZATION     "before bypass"  . CORONARY ARTERY BYPASS GRAFT     x 5 with lima at [place] WITH ANTIBIOTIC SPACERS Left 08/07/2014   Procedure: Replace Left Total Knee Arthroplasty,  Place Antibiotic Spacer;  Surgeon: [name], MD;  Location: [place] [facility];  Service: Orthopedics;  Laterality: Left;  . I&D EXTREMITY Left 05/09/2014   Procedure: Irrigation and Debridement Left Knee and Closure of Total Knee Arthroplasty Incision;  Surgeon: [name], MD;  Location: [place];  Service: Orthopedics;  Laterality: Left;  . I&D KNEE WITH POLY EXCHANGE Left 05/31/2014   Procedure: IRRIGATION AND DEBRIDEMENT LEFT KNEE, PLACE ANTIBIOTIC BEADS,  POLY EXCHANGE;  Surgeon: [name], MD;  Location: [place];  Service: Orthopedics;  Laterality: Left;  . IRRIGATION AND DEBRIDEMENT KNEE Left 01/12/2017   Procedure: IRRIGATION AND DEBRIDEMENT LEFT KNEE;  Surgeon: [name], MD;  Location: [place];  Service: Orthopedics;  Laterality: Left;  . JOINT REPLACEMENT    . KNEE ARTHROSCOPY Left 08-25-2012  . LOWER EXTREMITY ANGIOGRAPHY Left 08/25/2016   Procedure: Lower Extremity Angiography;  Surgeon: Wellington Hampshire, MD;  Location: [place] CV LAB;  Service: Cardiovascular;  Laterality: Left;  . PERIPHERAL VASCULAR BALLOON ANGIOPLASTY Left 08/25/2016   Procedure: PERIPHERAL VASCULAR BALLOON ANGIOPLASTY;  Surgeon: Wellington Hampshire, MD;  Location: [place] CV [facility];  Service: Cardiovascular;  Laterality: Left;  lt peroneal and ant tibial arteries cutting balloon  . REFRACTIVE SURGERY Bilateral   . TOE AMPUTATION Bilateral    "I've lost 7 toes over the last 7 years" (05/09/2014)  . TOE SURGERY Left April 2015   Big toe removed on left foot.  . TONSILLECTOMY    . TOTAL KNEE ARTHROPLASTY Left 04/10/2014   Procedure: TOTAL KNEE ARTHROPLASTY;  Surgeon: [name], MD;  Location: [place];  Service: Orthopedics;  Laterality: Left;  . TOTAL KNEE REVISION Left 10/25/2014   Procedure: LEFT TOTAL KNEE REVISION;  Surgeon: [name], MD;  Location: [place];  Service: Orthopedics;  Laterality: Left;  . TOTAL KNEE REVISION Left 11/26/2015   Procedure: Removal Left Total Knee Arthroplasty, Hinged Total Knee Arthroplasty;  Surgeon: [name], MD;  Location: [place];  Service: Orthopedics;  Laterality: Left;  . UVULOPALATOPHARYNGOPLASTY, TONSILLECTOMY AND SEPTOPLASTY  ~ 1989  . WOUND DEBRIDEMENT Left 05/09/2014   Dehiscence Left Total Knee Arthroplasty Incision    There were no vitals  filed for this visit.  Subjective Assessment - 08/02/17 1024    Subjective  No new complaints. No falls  to report. Reports he has a little pinching of loose skin at the top of his socket.     Pertinent History  Left TFA, ESRD with dialysis M,W,F, CHF, CAD s/p CABG 05/2016, PAD, CVA, DM2, neuropathy, arthritis, right toe amputations    Limitations  Lifting;Standing;Walking;House hold activities    Patient Stated Goals  He wants to use prosthesis to walk without assistance, garden, cook / work in [employer]     Currently in Pain?  No/denies    Pain Onset  1 to 4 weeks ago        [facility] Adult PT Treatment/Exercise - 08/02/17 1207      Transfers   Transfers  Sit to Stand;Stand to Sit  Sit to Stand  4: Min guard;4: Min assist    Sit to Stand Details  Verbal cues for sequencing;Verbal cues for safe use of DME/AE    Sit to Stand Details (indicate cue type and reason)  assist with cues needed for safety with standing to forearm crutches    Stand to Sit  5: Supervision;With upper extremity assist;With armrests;To chair/3-in-1    Stand to Sit Details (indicate cue type and reason)  Verbal cues for technique;Verbal cues for safe use of DME/AE    Stand to Sit Details  cues to egage prosthetic knee with sitting and on technique with crutches      Ambulation/Gait   Ambulation/Gait  Yes    Ambulation/Gait Assistance  4: Min guard;5: Supervision    Ambulation/Gait Assistance Details  pt alternates between 2 point gait pattern and 4 point gait pattern with crutches. Continues to need cues on posture,step length, weight shifting and to engage knee with gait. After barrier instruction, continued with gait for 100 ft to practice pattern with forearm crutches and weight shifting, VCs to step wider away from carpet lip to avoid catching the inside crutch which was throwing him off balance. He did well correcting his balance when it was challeneged, but the LOBs could have been prevented.     Ambulation Distance  (Feet)  345 Feet x1, then 100 x1 after curb/ramp/stairs, plus around gym    Assistive device  Prosthesis;Lofstrands    Gait Pattern  Step-to pattern;Decreased step length - right;Decreased stance time - left;Decreased stride length;Decreased hip/knee flexion - left;Decreased weight shift to left;Left circumduction;Left hip hike;Antalgic;Lateral hip instability;Trunk flexed;Decreased trunk rotation;Abducted - left;Poor foot clearance - left    Ambulation Surface  Level;Indoor    Stairs  Yes    Stairs Assistance  5: Supervision;4: Min guard;4: Min assist    Stairs Assistance Details (indicate cue type and reason)  With 2 rails, supervision; with 1 rail and forearm crutches, min guard- min assist with cues on technique. performed this way 2 reps working on best placement of bil crutches with both cruthes in the same hand working the best. Pt did report increased hand discomfort this way, however had a loss of balance when attempting to hold crutch/rail together needing assist to regain balance on descent.                  Stair Management Technique  Two rails;One rail Right;One rail Left;Other (comment) One rail, one forearm crutch    Number of Stairs  4 x3 reps total    Ramp  7: Independent;6: Modified independent (Device) Independent- RW; Mod-I- forearm crutches    Ramp Details (indicate cue type and reason)  No cues needed with RW. Only needed cues to keep crutches close enough to support him.    Curb  4: Min assist;6: Modified independent (Device/increase time) Mod I with RW; Min A- forearm crutches VCs for sequencing    Curb Details (indicate cue type and reason)  Used Aerobic step as simulated curb to practice and eliminate lip that catches prosthesis. 10 reps experimenting with different combinations of crutch and limb for stepping up and down until pt found the one he was most comfortable with.    Gait Comments  --      Prosthetics   Prosthetic Care Comments   STGs checked with RW. Pt Mod I with  RW and Supervision to Min guard with forearm crutches on level surfaces. Up to min assist with barriers at times. Pt  has graduated himself to full time use of bil forearm crutches with gait.    Current prosthetic wear tolerance (days/week)   daily    Current prosthetic wear tolerance (#hours/day)   reports all awake hours with a break somedays after dialysis for napping    Current prosthetic weight-bearing tolerance (hours/day)   Pt is shifting weight to prosthesis and is no longer compensating for misfitting of liner or socket.    Residual limb condition   intact per pt report    Education Provided  Residual limb care    Person(s) Educated  Patient    Education Method  Explanation;Demonstration;Verbal cues    Education Method  Verbalized understanding;Verbal cues required;Needs further instruction    Donning Prosthesis  Independent    Doffing Prosthesis  Independent        Pt verbalized proper method to weigh prosthesis in and out of dialysis.  PT Short Term Goals - 08/02/17 1043      PT SHORT TERM GOAL #1   Title  Patient verbalizes proper method to weigh prosthesis & weigh-in / weigh-out with dialysis. (All STGs Target Date: 07/22/2017)    Baseline  08/02/2017 Met today.    Time  4    Period  Weeks    Status  Achieved      PT SHORT TERM GOAL #2   Title  Patient ambulates 300' with RW or crutches engaging prosthesis with supervision / verbal cues.     Baseline  08/02/2017  Partially Met today with forearm crutches. Pt was supervision- min guard due to apparent fatigue and slight stumbles.    Time  4    Period  Weeks    Status  Partially Met      PT SHORT TERM GOAL #3   Title  Patient negotiates ramps, curbs with RW & stairs with 2 rails modified technique with supervision.     Baseline  08/02/2017 Met today, Independent with RW.    Time  4    Period  Weeks    Status  Achieved      PT SHORT TERM GOAL #4   Title  Patient sit to/from stand chairs with armrests without touching  support surface to stabilize and engages prothesis with supervision.     Baseline  08/02/2017 Met today.    Time  4    Period  Weeks    Status  Achieved      PT SHORT TERM GOAL #5   Title  Patient stands without UE support for 2 minutes with supervision.     Baseline  08/02/2017 Met today.    Time  4    Period  Weeks    Status  Achieved        PT Long Term Goals - 06/30/17 1511      PT LONG TERM GOAL #1   Title  Patient demonstrates & verbalizes proper prosthetic care including dialysis issues to enable safe use of prosthesis. (All LTGs Target Date: 09/21/2017)    Time  12    Period  Weeks    Status  On-going    Target Date  09/21/17      PT LONG TERM GOAL #2   Title  Patient tolerates prosthesis wear >90% of awake hours without skin or limb pain issues to enable function throughout his day.     Time  12    Period  Weeks    Status  On-going    Target Date  09/21/17  PT LONG TERM GOAL #3   Title  Berg Balance with device >36/56 to indicate lower fall risk.     Time  12    Period  Weeks    Status  On-going    Target Date  09/21/17      PT LONG TERM GOAL #4   Title  Patient ambulates 1000' outdoors including grass, ramps & curbs with cane or less and prosthesis modified independent.     Time  12    Period  Weeks    Status  On-going    Target Date  09/21/17      PT LONG TERM GOAL #5   Title  Patient modified independent on stairs including descending alternate pattern with 2 rails and step-to pattern with 1 rail & cane engaging prosthetic hydraulics.     Time  12    Period  Weeks    Status  On-going    Target Date  09/21/17      PT LONG TERM GOAL #6   Title  Patient demonstrates & verbalizes tasks with prosthesis to perform gardening & cooking modified independent to enable return to his hobbies.     Time  12    Period  Weeks    Status  On-going    Target Date  09/21/17        Plan - 08/02/17 1105    Clinical Impression Statement  Today's session focused  on checking STGs and continuing to work with forearm crutches. Pt. met or partially met all STGs and will send to PT for updated goals. Pt. would continue to benefit from PT to work toward unmet goals and improve functional mobility with forearm crutches and prosthesis.    Rehab Potential  Good    PT Frequency  2x / week    PT Duration  12 weeks    PT Treatment/Interventions  ADLs/Self Care Home Management;DME Instruction;Gait training;Stair training;Functional mobility training;Therapeutic activities;Therapeutic exercise;Balance training;Neuromuscular re-education;Canalith Repostioning;Patient/family education;Prosthetic Training;Vestibular    PT Next Visit Plan  prosthetic gait with forearm crutches including ramps/curbs. Gait outside with forearm crutches. Standing balance activities, work to engage hydraulic knee with all activities.     Consulted and Agree with Plan of Care  Patient       Patient will benefit from skilled therapeutic intervention in order to improve the following deficits and impairments:  Decreased activity tolerance, Abnormal gait, Decreased balance, Decreased endurance, Decreased knowledge of use of DME, Decreased mobility, Decreased strength, Prosthetic Dependency, Postural dysfunction, Dizziness  Visit Diagnosis: Muscle weakness (generalized)  Other abnormalities of gait and mobility  Unsteadiness on feet  Abnormal posture     Problem List Patient Active Problem List   Diagnosis Date Noted  . Altered mental status   . Cerebral thrombosis with cerebral infarction 02/06/2017  . Cerebral embolism with cerebral infarction 02/06/2017  . Above knee amputation status, left ([place])   . Pressure injury of skin 01/30/2017  . Acute lower UTI 01/29/2017  . Acute metabolic encephalopathy 56/81/2751  . UTI (urinary tract infection) 01/29/2017  . Quadriceps muscle rupture, left, initial encounter   . Fall 01/09/2017  . Gait disturbance 01/09/2017  . Staphylococcus  aureus infection 01/09/2017  . Infection of prosthetic left knee joint ([place]) 01/09/2017  . Fever, unknown origin 11/09/2016  . Critical lower limb ischemia 08/25/2016  . Ulcer of left midfoot with fat layer exposed ([place]) 08/13/2016  . Diabetic ulcer of left midfoot associated with type 2 diabetes mellitus, with fat layer exposed ([place])  07/25/2016  . Peripheral neuropathy 07/22/2016  . Tobacco abuse 07/22/2016  . CAD in native artery 05/18/2016  . CAD, multiple vessel 05/11/2016  . Positive cardiac stress test 05/11/2016  . Abnormal stress test 04/30/2016  . Pre-transplant evaluation for kidney transplant 04/30/2016  . S/P revision of total knee 11/26/2015  . Pain in the chest   . Acute on chronic diastolic heart failure ([place]) 07/05/2015  . Volume overload 07/04/2015  . Shortness of breath 07/04/2015  . Hypoxemia 07/04/2015  . Elevated troponin   . End-stage renal disease on hemodialysis ([place])   . Hypervolemia   . 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 Pyogenic bacterial arthritis of knee, left ([employer]) 08/07/2014  . Tachycardia 07/24/2014  . Acute upper respiratory infection 07/24/2014  . ESRD on dialysis ([place]) 07/14/2014  . Type II diabetes mellitus ([place]) 07/14/2014  . Anemia in chronic kidney disease 07/14/2014  . Congestive heart failure (CHF) ([place]) 07/13/2014  . Surgical wound dehiscence 05/09/2014  . Dehiscence of closure of skin 05/09/2014  . Total knee replacement status 04/10/2014  . Diabetes mellitus with renal manifestations, controlled ([place]) 10/24/2013  . Hypertensive renal disease 06/27/2013  . DM type 2 causing vascular disease ([place]) 06/27/2013  . Erectile dysfunction 06/27/2013  . Depression 06/27/2013  . Claudication of left lower extremity ([place]) 12/19/2012  . Essential hypertension, benign 12/19/2012  . Sinusitis, acute maxillary 11/22/2012  . Otitis, externa, infective 11/14/2012  . Leg edema, left 11/14/2012  . End stage renal disease ([place])  10/02/2012  . Controlled type 2 DM with proteinuria or microalbuminuria 09/19/2012  . GERD (gastroesophageal reflux disease) 09/19/2012  . Leukocytosis 09/19/2012  . Lacunar infarction ([place]) 08/17/2012  . Polymyalgia rheumatica ([place]) 08/17/2012  . 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 Essential hypertension 05/10/2012  . Vitamin D deficiency 05/10/2012  . Diabetes mellitus due to underlying condition ([place]) 05/10/2012  . Hyperlipidemia LDL goal <100 05/10/2012  . Anemia of chronic disease 05/10/2012  . Screening for prostate cancer 05/10/2012  . Chronic kidney disease (CKD), stage IV (severe) ([place]) 05/10/2012  . Peripheral autonomic neuropathy due to DM ([place]) 05/10/2012  . Callus of foot 05/10/2012  . Urgency of urination 05/10/2012  . Hyperkalemia 05/10/2012  . Candidiasis of the esophagus 10/12/2011  . Internal hemorrhoids without mention of complication 83/38/2505  . Pre-syncope 07/25/2009  . DJD (degenerative joint disease) of cervical spine 02/17/2009    Arthor Captain, SPTA 08/02/2017, 12:25 PM  [facility] [address] [place], [place], [address] Phone: [phone]   Fax:  [phone]  Name: Bryse Blanchette MRN: [id number] Date of Birth: [birth year]  PT Short Term Goals - 08/11/17 1954      PT SHORT TERM GOAL #1   Title  Patient verbalizes adjusting ply socks for volume changes with minimal PT guidance.    Status  New    Target Date  08/26/17      PT SHORT TERM GOAL #2   Title  Patient ambulates 400' with crutches engaging prosthesis with supervision / verbal cues.     Status  New    Target Date  09/09/17      PT SHORT TERM GOAL #3   Title  Patient negotiates ramps, curbs with crutches & stairs with 1 rail/1 crutch with supervision.     Status  New    Target Date  09/09/17      PT SHORT TERM GOAL #4   Title  Patient stands 2 minutes without UE  support, reaches 5" and to floor with supervision.      Status  New    Target Date  09/09/17      Jamey Reas, PT, DPT PT Specializing in [place] 08/11/17 7:58 PM Phone:  [phone]  Fax:  [phone] [facility] [address] [place], [place] [address]

## 2017-08-04 ENCOUNTER — Ambulatory Visit: Payer: Medicare HMO | Admitting: Physical Therapy

## 2017-08-09 ENCOUNTER — Ambulatory Visit: Payer: Medicare HMO | Admitting: Physical Therapy

## 2017-08-11 ENCOUNTER — Encounter: Payer: Self-pay | Admitting: Physical Therapy

## 2017-08-11 ENCOUNTER — Ambulatory Visit: Payer: Medicare HMO | Attending: Orthopedic Surgery | Admitting: Physical Therapy

## 2017-08-11 DIAGNOSIS — R2681 Unsteadiness on feet: Secondary | ICD-10-CM | POA: Diagnosis present

## 2017-08-11 DIAGNOSIS — M6281 Muscle weakness (generalized): Secondary | ICD-10-CM | POA: Insufficient documentation

## 2017-08-11 DIAGNOSIS — R293 Abnormal posture: Secondary | ICD-10-CM | POA: Insufficient documentation

## 2017-08-11 DIAGNOSIS — R2689 Other abnormalities of gait and mobility: Secondary | ICD-10-CM | POA: Insufficient documentation

## 2017-08-11 NOTE — Therapy (Signed)
[facility] [address] [place], [place], [address] Phone: [phone]   Fax:  [phone]  Physical Therapy Treatment  Patient Details  Name: Matthew Kline MRN: [id number] Date of Birth: [birth year] Referring Provider: Meridee Score, MD   Encounter Date: 08/11/2017  PT End of Session - 08/11/17 0852    Visit Number  9    Number of Visits  25    Date for PT Re-Evaluation  09/21/17    Authorization Type  [employer]    PT Start Time  0850    PT Stop Time  0930    PT Time Calculation (min)  40 min    Equipment Utilized During Treatment  Gait belt    Activity Tolerance  Patient tolerated treatment well    Behavior During Therapy  [facility] for tasks assessed/performed       Past Medical History:  Diagnosis Date  . Anemia, unspecified   . Anxiety   . Arthralgia 2010   polyarticular  . Arthritis    "back, knees" (01/10/2017)  . [facility])    "kidney area" (01/10/2017)  . CHF (congestive heart failure) ([place]) 07/25/2009   denies  . Chronic lower back pain   . Coronary artery disease   . Coughing    pt. reports that he has drainage from sinus infection  . Diabetic foot ulcer ([place])   . Diabetic neuropathy ([place])   . ESRD (end stage renal disease) on dialysis [facility])    started 12/2012; "MWF; [place] "  (01/10/2017)  . GERD (gastroesophageal reflux disease)    hx "before I lost weight", no problem 9 years  . Hemodialysis access site with mature fistula ([place])   . Hemorrhoids, internal 10/2011   small  . High cholesterol   . History of blood transfusion    "related to the anemia"  . Hypertension   . Insomnia, unspecified   . Lacunar infarction ([place]) 2006   RUE/RLE, speech  . Long term (current) use of anticoagulants   . Myocardial infarction ([place]) 1995  . Orthostatic hypotension   . Osteomyelitis of foot, left, acute ([place])   . Other chronic postoperative pain   . Pneumonia    "probably twice"  (01/10/2017)  . Polymyalgia rheumatica ([place])   . Renal insufficiency   . Sleep apnea    "lost weight; no more problem" (01/10/2017)  . Stroke ([place]) 01/10/06   denies residual on 05/09/2014  . Type II diabetes mellitus ([place]) dx'd 1995  . Unspecified hereditary and idiopathic peripheral neuropathy    feet  . Unspecified osteomyelitis, site unspecified   . Unspecified vitamin D deficiency     Past Surgical History:  Procedure Laterality Date  . ABDOMINAL AORTOGRAM N/A 08/25/2016   Procedure: ABDOMINAL AORTOGRAM;  Surgeon: Wellington Hampshire, MD;  Location: [place] CV [facility];  Service: Cardiovascular;  Laterality: N/A;  . AMPUTATION  01/21/2012   Procedure: AMPUTATION RAY;  Surgeon: [name], MD;  Location: [place];  Service: Orthopedics;  Laterality: Left;  Left Foot 4th Ray Amputation  . AMPUTATION Left 05/04/2013   Procedure: AMPUTATION DIGIT;  Surgeon: [name], MD;  Location: [place];  Service: Orthopedics;  Laterality: Left;  Left Great Toe Amputation at [facility]  . AMPUTATION Left 01/14/2017   Procedure: AMPUTATION ABOVE LEFT KNEE;  Surgeon: [name], MD;  Location: [place];  Service: Orthopedics;  Laterality: Left;  . ANTERIOR CERVICAL DECOMP/DISCECTOMY FUSION  02/2011  . BACK SURGERY    .  BASCILIC VEIN TRANSPOSITION Left 10/19/2012   Procedure: BASCILIC VEIN TRANSPOSITION;  Surgeon: Serafina Mitchell, MD;  Location: [facility];  Service: Vascular;  Laterality: Left;  . CARDIAC CATHETERIZATION     "before bypass"  . CORONARY ARTERY BYPASS GRAFT     x 5 with lima at [place] WITH ANTIBIOTIC SPACERS Left 08/07/2014   Procedure: Replace Left Total Knee Arthroplasty,  Place Antibiotic Spacer;  Surgeon: [name], MD;  Location: [facility];  Service: Orthopedics;  Laterality: Left;  . I&D EXTREMITY Left 05/09/2014   Procedure: Irrigation and Debridement Left Knee and Closure of Total Knee Arthroplasty Incision;  Surgeon: [name], MD;  Location: [place];  Service: Orthopedics;  Laterality: Left;  . I&D KNEE WITH POLY EXCHANGE Left 05/31/2014   Procedure: IRRIGATION AND DEBRIDEMENT LEFT KNEE, PLACE ANTIBIOTIC BEADS,  POLY EXCHANGE;  Surgeon: [name], MD;  Location: [place];  Service: Orthopedics;  Laterality: Left;  . IRRIGATION AND DEBRIDEMENT KNEE Left 01/12/2017   Procedure: IRRIGATION AND DEBRIDEMENT LEFT KNEE;  Surgeon: [name], MD;  Location: [place];  Service: Orthopedics;  Laterality: Left;  . JOINT REPLACEMENT    . KNEE ARTHROSCOPY Left 08-25-2012  . LOWER EXTREMITY ANGIOGRAPHY Left 08/25/2016   Procedure: Lower Extremity Angiography;  Surgeon: Wellington Hampshire, MD;  Location: [place] CV LAB;  Service: Cardiovascular;  Laterality: Left;  . PERIPHERAL VASCULAR BALLOON ANGIOPLASTY Left 08/25/2016   Procedure: PERIPHERAL VASCULAR BALLOON ANGIOPLASTY;  Surgeon: Wellington Hampshire, MD;  Location: [place] CV LAB;  Service: Cardiovascular;  Laterality: Left;  lt peroneal and ant tibial arteries cutting balloon  . REFRACTIVE SURGERY Bilateral   . TOE AMPUTATION Bilateral    "I've lost 7 toes over the last 7 years" (05/09/2014)  . TOE SURGERY Left April 2015   Big toe removed on left foot.  . TONSILLECTOMY    . TOTAL KNEE ARTHROPLASTY Left 04/10/2014   Procedure: TOTAL KNEE ARTHROPLASTY;  Surgeon: [name], MD;  Location: [place];  Service: Orthopedics;  Laterality: Left;  . TOTAL KNEE REVISION Left 10/25/2014   Procedure: LEFT TOTAL KNEE REVISION;  Surgeon: [name], MD;  Location: [place];  Service: Orthopedics;  Laterality: Left;  . TOTAL KNEE REVISION Left 11/26/2015   Procedure: Removal Left Total Knee Arthroplasty, Hinged Total Knee Arthroplasty;  Surgeon: [name], MD;  Location: [place];  Service: Orthopedics;  Laterality: Left;  . UVULOPALATOPHARYNGOPLASTY, TONSILLECTOMY AND SEPTOPLASTY  ~ 1989  . WOUND DEBRIDEMENT Left 05/09/2014   Dehiscence Left Total Knee Arthroplasty Incision    There were no vitals  filed for this visit.  Subjective Assessment - 08/11/17 0850    Subjective  No new complaints. No falls to report. Using crutches at all times. Reports the socket is hitting a nerve causing some phantom pain.     Pertinent History  Left TFA, ESRD with dialysis M,W,F, CHF, CAD s/p CABG 05/2016, PAD, CVA, DM2, neuropathy, arthritis, right toe amputations    Limitations  Lifting;Standing;Walking;House hold activities    Patient Stated Goals  He wants to use prosthesis to walk without assistance, garden, cook / work in [employer]     Currently in Pain?  No/denies    Pain Score  0-No pain          [facility] Adult PT Treatment/Exercise - 08/11/17 0853      Transfers   Transfers  Sit to Stand;Stand to Sit  Sit to Stand  5: Supervision;With upper extremity assist;From chair/3-in-1    Sit to Stand Details  Verbal cues for technique;Verbal cues for safe use of DME/AE    Sit to Stand Details (indicate cue type and reason)  cues for safer technique to stand with forearm crutches    Stand to Sit  5: Supervision;With upper extremity assist;With armrests;To chair/3-in-1    Stand to Sit Details (indicate cue type and reason)  Verbal cues for technique;Verbal cues for safe use of DME/AE    Stand to Sit Details  cues for crutch placement with sititng for safety      Ambulation/Gait   Ambulation/Gait  Yes    Ambulation/Gait Assistance  5: Supervision;4: Min guard    Ambulation/Gait Assistance Details  pt utilizes a 2 point gait pattern for most of gait, however at times demo's 3 point gait patttern. continues to need cues to engage prosthesis with gait (for knee flexion with swing phase), for posture and weight shifting with gait.     Ambulation Distance (Feet)  450 Feet x1, 400 x1 in/outdoors, plus around gym    Assistive device  Prosthesis;Lofstrands    Gait Pattern  Step-to pattern;Decreased step length - right;Decreased stance time - left;Decreased stride length;Decreased hip/knee flexion - left;Decreased  weight shift to left;Left circumduction;Left hip hike;Antalgic;Lateral hip instability;Trunk flexed;Decreased trunk rotation;Abducted - left;Poor foot clearance - left    Ambulation Surface  Level;Indoor    Stairs  Yes    Stairs Assistance  5: Supervision    Stairs Assistance Details (indicate cue type and reason)  1 rep switching rails, 1 rep with just right rail, 1 rep with just left rail. cues for weight shifting and hand advancement on rails.     Stair Management Technique  One rail Right;One rail Left;Step to pattern;Forwards    Number of Stairs  4 x3 reps    Ramp  Other (comment) min guard assist    Ramp Details (indicate cue type and reason)  with loftstrand crutches on outdoor inclines/declines    Curb  Other (comment) min guard assist    Curb Details (indicate cue type and reason)  with loftstrand crutches on outdoor curb, cues on sequencing      Prosthetics   Prosthetic Care Comments   worked on positioning of liner on limb attempting to bring extra skin forward as barrier in front of liner for improved comfort. mild change reported. Pt is to make appt with prosthetist to have any needed adjustments made. Pt is also to bring socks to next appt to work on how to don them.    Current prosthetic wear tolerance (days/week)   daily    Current prosthetic wear tolerance (#hours/day)   reports all awake hours with a break somedays after dialysis for napping    Residual limb condition   intact per pt report. had a place from where skin got pinched from liner/socket yesterday, cleared up over night.     Education Provided  Residual limb care;Correct ply sock adjustment;Proper wear schedule/adjustment;Proper weight-bearing schedule/adjustment    Person(s) Educated  Patient    Education Method  Explanation;Demonstration;Verbal cues    Education Method  Verbalized understanding;Verbal cues required;Needs further instruction    Donning Prosthesis  Independent    Doffing Prosthesis  Independent           PT Short Term Goals - 08/02/17 1043      PT SHORT TERM GOAL #1   Title  Patient verbalizes proper method to weigh prosthesis &  weigh-in / weigh-out with dialysis. (All STGs Target Date: 07/22/2017)    Baseline  08/02/2017 Met today.    Time  4    Period  Weeks    Status  Achieved      PT SHORT TERM GOAL #2   Title  Patient ambulates 300' with RW or crutches engaging prosthesis with supervision / verbal cues.     Baseline  08/02/2017  Partially Met today with forearm crutches. Pt was supervision- min guard due to apparent fatigue and slight stumbles.    Time  4    Period  Weeks    Status  Partially Met      PT SHORT TERM GOAL #3   Title  Patient negotiates ramps, curbs with RW & stairs with 2 rails modified technique with supervision.     Baseline  08/02/2017 Met today, Independent with RW.    Time  4    Period  Weeks    Status  Achieved      PT SHORT TERM GOAL #4   Title  Patient sit to/from stand chairs with armrests without touching support surface to stabilize and engages prothesis with supervision.     Baseline  08/02/2017 Met today.    Time  4    Period  Weeks    Status  Achieved      PT SHORT TERM GOAL #5   Title  Patient stands without UE support for 2 minutes with supervision.     Baseline  08/02/2017 Met today.    Time  4    Period  Weeks    Status  Achieved        PT Long Term Goals - 06/30/17 1511      PT LONG TERM GOAL #1   Title  Patient demonstrates & verbalizes proper prosthetic care including dialysis issues to enable safe use of prosthesis. (All LTGs Target Date: 09/21/2017)    Time  12    Period  Weeks    Status  On-going    Target Date  09/21/17      PT LONG TERM GOAL #2   Title  Patient tolerates prosthesis wear >90% of awake hours without skin or limb pain issues to enable function throughout his day.     Time  12    Period  Weeks    Status  On-going    Target Date  09/21/17      PT LONG TERM GOAL #3   Title  Berg Balance with device  >36/56 to indicate lower fall risk.     Time  12    Period  Weeks    Status  On-going    Target Date  09/21/17      PT LONG TERM GOAL #4   Title  Patient ambulates 1000' outdoors including grass, ramps & curbs with cane or less and prosthesis modified independent.     Time  12    Period  Weeks    Status  On-going    Target Date  09/21/17      PT LONG TERM GOAL #5   Title  Patient modified independent on stairs including descending alternate pattern with 2 rails and step-to pattern with 1 rail & cane engaging prosthetic hydraulics.     Time  12    Period  Weeks    Status  On-going    Target Date  09/21/17      PT LONG TERM GOAL #6   Title  Patient demonstrates &  verbalizes tasks with prosthesis to perform gardening & cooking modified independent to enable return to his hobbies.     Time  12    Period  Weeks    Status  On-going    Target Date  09/21/17            Plan - 08/11/17 8841    Clinical Impression Statement  Today's skilled session continued to focus on use of loftstrand crutches with gait/barriers with emphasis on increased distances. Less cues/assistance needed today vs previous session on barriers. Pt does continue to complain of ill fitting socket. Did not bring any socks today to work on fit, he is to bring them next session. He is also to schedule an appt with the prosthetist to have any needed adjustments made. Pt is progressing toward goals and should benefit from continued PT to progress toward unmet goals.     Rehab Potential  Good    PT Frequency  2x / week    PT Duration  12 weeks    PT Treatment/Interventions  ADLs/Self Care Home Management;DME Instruction;Gait training;Stair training;Functional mobility training;Therapeutic activities;Therapeutic exercise;Balance training;Neuromuscular re-education;Canalith Repostioning;Patient/family education;Prosthetic Training;Vestibular    PT Next Visit Plan  prosthetic gait with forearm crutches including  ramps/curbs. Gait outside with forearm crutches. Standing balance activities, work to engage hydraulic knee with all activities.     Consulted and Agree with Plan of Care  Patient       Patient will benefit from skilled therapeutic intervention in order to improve the following deficits and impairments:  Decreased activity tolerance, Abnormal gait, Decreased balance, Decreased endurance, Decreased knowledge of use of DME, Decreased mobility, Decreased strength, Prosthetic Dependency, Postural dysfunction, Dizziness  Visit Diagnosis: Muscle weakness (generalized)  Other abnormalities of gait and mobility  Unsteadiness on feet     Problem List Patient Active Problem List   Diagnosis Date Noted  . Altered mental status   . Cerebral thrombosis with cerebral infarction 02/06/2017  . Cerebral embolism with cerebral infarction 02/06/2017  . Above knee amputation status, left ([place])   . Pressure injury of skin 01/30/2017  . Acute lower UTI 01/29/2017  . Acute metabolic encephalopathy 66/06/3014  . UTI (urinary tract infection) 01/29/2017  . Quadriceps muscle rupture, left, initial encounter   . Fall 01/09/2017  . Gait disturbance 01/09/2017  . Staphylococcus aureus infection 01/09/2017  . Infection of prosthetic left knee joint ([place]) 01/09/2017  . Fever, unknown origin 11/09/2016  . Critical lower limb ischemia 08/25/2016  . Ulcer of left midfoot with fat layer exposed ([place]) 08/13/2016  . Diabetic ulcer of left midfoot associated with type 2 diabetes mellitus, with fat layer exposed ([place]) 07/25/2016  . Peripheral neuropathy 07/22/2016  . Tobacco abuse 07/22/2016  . CAD in native artery 05/18/2016  . CAD, multiple vessel 05/11/2016  . Positive cardiac stress test 05/11/2016  . Abnormal stress test 04/30/2016  . Pre-transplant evaluation for kidney transplant 04/30/2016  . S/P revision of total knee 11/26/2015  . Pain in the chest   . Acute on chronic diastolic heart failure ([place])  07/05/2015  . Volume overload 07/04/2015  . Shortness of breath 07/04/2015  . Hypoxemia 07/04/2015  . Elevated troponin   . End-stage renal disease on hemodialysis ([place])   . Hypervolemia   . 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 Pyogenic bacterial arthritis of knee, left ([place]) 08/07/2014  . Tachycardia 07/24/2014  . Acute upper respiratory infection 07/24/2014  . ESRD on dialysis ([place]) 07/14/2014  . Type II diabetes  mellitus ([place]) 07/14/2014  . Anemia in chronic kidney disease 07/14/2014  . Congestive heart failure (CHF) ([place]) 07/13/2014  . Surgical wound dehiscence 05/09/2014  . Dehiscence of closure of skin 05/09/2014  . Total knee replacement status 04/10/2014  . Diabetes mellitus with renal manifestations, controlled ([place]) 10/24/2013  . Hypertensive renal disease 06/27/2013  . DM type 2 causing vascular disease ([place]) 06/27/2013  . Erectile dysfunction 06/27/2013  . Depression 06/27/2013  . Claudication of left lower extremity ([place]) 12/19/2012  . Essential hypertension, benign 12/19/2012  . Sinusitis, acute maxillary 11/22/2012  . Otitis, externa, infective 11/14/2012  . Leg edema, left 11/14/2012  . End stage renal disease ([place]) 10/02/2012  . Controlled type 2 DM with proteinuria or microalbuminuria 09/19/2012  . GERD (gastroesophageal reflux disease) 09/19/2012  . Leukocytosis 09/19/2012  . Lacunar infarction ([place]) 08/17/2012  . Polymyalgia rheumatica ([place]) 08/17/2012  . 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 Essential hypertension 05/10/2012  . Vitamin D deficiency 05/10/2012  . Diabetes mellitus due to underlying condition ([place]) 05/10/2012  . Hyperlipidemia LDL goal <100 05/10/2012  . Anemia of chronic disease 05/10/2012  . Screening for prostate cancer 05/10/2012  . Chronic kidney disease (CKD), stage IV (severe) ([place]) 05/10/2012  . Peripheral autonomic neuropathy due to DM ([place]) 05/10/2012  . Callus of foot 05/10/2012  . Urgency of urination  05/10/2012  . Hyperkalemia 05/10/2012  . Candidiasis of the esophagus 10/12/2011  . Internal hemorrhoids without mention of complication 73/40/3709  . Pre-syncope 07/25/2009  . DJD (degenerative joint disease) of cervical spine 02/17/2009    Willow Ora, PTA, [facility] Outpatient Neuro [facility] [address], [place], [place] [address] [phone] 08/11/17, 6:42 PM   Name: Shady Bradish MRN: [id number] Date of Birth: 07/03/1960

## 2017-08-16 ENCOUNTER — Encounter: Payer: Self-pay | Admitting: Physical Therapy

## 2017-08-16 ENCOUNTER — Ambulatory Visit: Payer: Medicare HMO | Admitting: Physical Therapy

## 2017-08-16 DIAGNOSIS — R2689 Other abnormalities of gait and mobility: Secondary | ICD-10-CM

## 2017-08-16 DIAGNOSIS — R2681 Unsteadiness on feet: Secondary | ICD-10-CM

## 2017-08-16 DIAGNOSIS — M6281 Muscle weakness (generalized): Secondary | ICD-10-CM

## 2017-08-16 DIAGNOSIS — R293 Abnormal posture: Secondary | ICD-10-CM

## 2017-08-16 NOTE — Therapy (Signed)
[facility] [address] [place], [place], [address] Phone: [phone]   Fax:  [phone]  Physical Therapy Treatment/Progress Note  Patient Details  Name: Matthew Kline MRN: [id number] Date of Birth: Jan 03, 1961 Referring Provider: Meridee Score, MD   Encounter Date: 08/16/2017   This note covers dates from 06/23/17 through 08/15/17.   PT End of Session - 08/16/17 0938    Visit Number  10    Number of Visits  25    Date for PT Re-Evaluation  09/21/17    Authorization Type  [employer]    PT Start Time  0933    PT Stop Time  1015    PT Time Calculation (min)  42 min    Equipment Utilized During Treatment  Gait belt    Activity Tolerance  Patient tolerated treatment well    Behavior During Therapy  WFL for tasks assessed/performed       Past Medical History:  Diagnosis Date  . Anemia, unspecified   . Anxiety   . Arthralgia 2010   polyarticular  . Arthritis    "back, knees" (01/10/2017)  . Cancer [facility])    "kidney area" (01/10/2017)  . CHF (congestive heart failure) ([place]) 07/25/2009   denies  . Chronic lower back pain   . Coronary artery disease   . Coughing    pt. reports that he has drainage from sinus infection  . Diabetic foot ulcer ([place])   . Diabetic neuropathy ([place])   . ESRD (end stage renal disease) on dialysis [facility])    started 12/2012; "MWF; [facility] "  (01/10/2017)  . GERD (gastroesophageal reflux disease)    hx "before I lost weight", no problem 9 years  . Hemodialysis access site with mature fistula ([place])   . Hemorrhoids, internal 10/2011   small  . High cholesterol   . History of blood transfusion    "related to the anemia"  . Hypertension   . Insomnia, unspecified   . Lacunar infarction ([place]) 2006   RUE/RLE, speech  . Long term (current) use of anticoagulants   . Myocardial infarction ([place]) 1995  . Orthostatic hypotension   . Osteomyelitis of foot, left, acute ([place])   .  Other chronic postoperative pain   . Pneumonia    "probably twice" (01/10/2017)  . Polymyalgia rheumatica ([place])   . Renal insufficiency   . Sleep apnea    "lost weight; no more problem" (01/10/2017)  . Stroke ([place]) 01/10/06   denies residual on 05/09/2014  . Type II diabetes mellitus ([place]) dx'd 1995  . Unspecified hereditary and idiopathic peripheral neuropathy    feet  . Unspecified osteomyelitis, site unspecified   . Unspecified vitamin D deficiency     Past Surgical History:  Procedure Laterality Date  . ABDOMINAL AORTOGRAM N/A 08/25/2016   Procedure: ABDOMINAL AORTOGRAM;  Surgeon: Wellington Hampshire, MD;  Location: [place] [facility];  Service: Cardiovascular;  Laterality: N/A;  . AMPUTATION  01/21/2012   Procedure: AMPUTATION RAY;  Surgeon: [name], MD;  Location: [place];  Service: Orthopedics;  Laterality: Left;  Left Foot 4th Ray Amputation  . AMPUTATION Left 05/04/2013   Procedure: AMPUTATION DIGIT;  Surgeon: [name], MD;  Location: [place];  Service: Orthopedics;  Laterality: Left;  Left Great Toe Amputation at [facility]  . AMPUTATION Left 01/14/2017   Procedure: AMPUTATION ABOVE LEFT KNEE;  Surgeon: [name], MD;  Location: [place];  Service: Orthopedics;  Laterality: Left;  .  ANTERIOR CERVICAL DECOMP/DISCECTOMY FUSION  02/2011  . BACK SURGERY    . BASCILIC VEIN TRANSPOSITION Left 10/19/2012   Procedure: BASCILIC VEIN TRANSPOSITION;  Surgeon: Serafina Mitchell, MD;  Location: [place];  Service: Vascular;  Laterality: Left;  . CARDIAC CATHETERIZATION     "before bypass"  . CORONARY ARTERY BYPASS GRAFT     x 5 with lima at [place] WITH ANTIBIOTIC SPACERS Left 08/07/2014   Procedure: Replace Left Total Knee Arthroplasty,  Place Antibiotic Spacer;  Surgeon: [name], MD;  Location: [place];  Service: Orthopedics;  Laterality: Left;  . I&D EXTREMITY Left 05/09/2014   Procedure: Irrigation and Debridement Left Knee and Closure of Total  Knee Arthroplasty Incision;  Surgeon: [name], MD;  Location: [place];  Service: Orthopedics;  Laterality: Left;  . I&D KNEE WITH POLY EXCHANGE Left 05/31/2014   Procedure: IRRIGATION AND DEBRIDEMENT LEFT KNEE, PLACE ANTIBIOTIC BEADS,  POLY EXCHANGE;  Surgeon: [name], MD;  Location: [place];  Service: Orthopedics;  Laterality: Left;  . IRRIGATION AND DEBRIDEMENT KNEE Left 01/12/2017   Procedure: IRRIGATION AND DEBRIDEMENT LEFT KNEE;  Surgeon: [name], MD;  Location: [place];  Service: Orthopedics;  Laterality: Left;  . JOINT REPLACEMENT    . KNEE ARTHROSCOPY Left 08-25-2012  . LOWER EXTREMITY ANGIOGRAPHY Left 08/25/2016   Procedure: Lower Extremity Angiography;  Surgeon: Wellington Hampshire, MD;  Location: [place] [facility];  Service: Cardiovascular;  Laterality: Left;  . PERIPHERAL VASCULAR BALLOON ANGIOPLASTY Left 08/25/2016   Procedure: PERIPHERAL VASCULAR BALLOON ANGIOPLASTY;  Surgeon: Wellington Hampshire, MD;  Location: [place] [facility];  Service: Cardiovascular;  Laterality: Left;  lt peroneal and ant tibial arteries cutting balloon  . REFRACTIVE SURGERY Bilateral   . TOE AMPUTATION Bilateral    "I've lost 7 toes over the last 7 years" (05/09/2014)  . TOE SURGERY Left April 2015   Big toe removed on left foot.  . TONSILLECTOMY    . TOTAL KNEE ARTHROPLASTY Left 04/10/2014   Procedure: TOTAL KNEE ARTHROPLASTY;  Surgeon: [name], MD;  Location: [place];  Service: Orthopedics;  Laterality: Left;  . TOTAL KNEE REVISION Left 10/25/2014   Procedure: LEFT TOTAL KNEE REVISION;  Surgeon: [name], MD;  Location: [place];  Service: Orthopedics;  Laterality: Left;  . TOTAL KNEE REVISION Left 11/26/2015   Procedure: Removal Left Total Knee Arthroplasty, Hinged Total Knee Arthroplasty;  Surgeon: [name], MD;  Location: [place];  Service: Orthopedics;  Laterality: Left;  . UVULOPALATOPHARYNGOPLASTY, TONSILLECTOMY AND SEPTOPLASTY  ~ 1989  . WOUND DEBRIDEMENT Left 05/09/2014    Dehiscence Left Total Knee Arthroplasty Incision    There were no vitals filed for this visit.  Subjective Assessment - 08/16/17 0937    Subjective  No new complaints. No falls to report. Using crutches at all times. Reports the socket is hitting a nerve causing some phantom pain and continues to rotate. He reports he attempted to call [facility] and no one answered. Has not called again. Reports the prosthesis fell off yesterday after driving home from dialysisi when he was getting out of car at home. Called his daughter to come get him with wheelchair.     Pertinent History  Left TFA, ESRD with dialysis M,W,F, CHF, CAD s/p CABG 05/2016, PAD, CVA, DM2, neuropathy, arthritis, right toe amputations    Limitations  Lifting;Standing;Walking;House hold activities    Patient Stated Goals  He wants to  use prosthesis to walk without assistance, garden, cook / work in [employer]     Currently in Pain?  No/denies    Pain Score  0-No pain          [facility] Adult PT Treatment/Exercise - 08/16/17 0951      Transfers   Transfers  Sit to Stand;Stand to Sit    Sit to Stand  5: Supervision;With upper extremity assist;From chair/3-in-1    Stand to Sit  5: Supervision;With upper extremity assist;With armrests;To chair/3-in-1      Ambulation/Gait   Ambulation/Gait  Yes    Ambulation/Gait Assistance  5: Supervision    Ambulation/Gait Assistance Details  intially worked on distance with bil forearm crutches with cues on posture and to engage hydraulics with gait. progressed to initiating gait with single forearm crutch with min assist needed for balance, along with cues on posture, weight shifting and sequencing.          Ambulation Distance (Feet)  250 Feet x1, plus around gym w/bil crutches; 95 x1 w/single crutch    Assistive device  Prosthesis;Lofstrands;R Forearm Crutch    Gait Pattern  Step-to pattern;Decreased step length - right;Decreased stance time - left;Decreased stride length;Decreased hip/knee flexion -  left;Decreased weight shift to left;Left circumduction;Left hip hike;Antalgic;Lateral hip instability;Trunk flexed;Decreased trunk rotation;Abducted - left;Poor foot clearance - left    Ambulation Surface  Level;Indoor    Ramp  5: Supervision    Ramp Details (indicate cue type and reason)  with bil loftstrands    Curb  5: Supervision    Curb Details (indicate cue type and reason)  with bil loftstrands      High Level Balance   High Level Balance Activities  Negotitating around obstacles;Negotiating over obstacles    High Level Balance Comments  2 hoola hoops with 3 bolsters of vaired heights in a row: working on figure 8's around hoops with emphasis on pelvic alignment and step length to accomplish this, min gaurd assist for balance with bil forearm crutches; fwd stepping over blosters x 4 laps with bil crutches with cues on correct sequencing for safety and crutch placement; lateral stepping over higher bolsters x 2 resp with bil crutches with cues/min guard assist.                       Prosthetics   Prosthetic Care Comments   educated pt on correct way to don his socks for improved socket fit. donned single ply sock today, pt brought other ply socks as well. edcuated on how to differentiate between the plys and signs to know when to increase his sock ply. pt verbalized understanding.  Pt also plans to call [facility] and set up appt with [name] today.                               Current prosthetic wear tolerance (days/week)   daily    Current prosthetic wear tolerance (#hours/day)   reports all awake hours with a break somedays after dialysis for napping    Residual limb condition   no issues per pt report    Education Provided  Correct ply sock adjustment;Proper wear schedule/adjustment;Proper weight-bearing schedule/adjustment;Proper Donning;Residual limb care    Person(s) Educated  Patient    Education Method  Explanation;Demonstration;Verbal cues    Education Method  Verbalized  understanding;Tactile cues required;Needs further instruction    Donning Prosthesis  Supervision cues needed on sock ply use/donning  Doffing Prosthesis  Independent         PT Short Term Goals - 08/11/17 1954      PT SHORT TERM GOAL #1   Title  Patient verbalizes adjusting ply socks for volume changes with minimal PT guidance.    Status  New    Target Date  08/26/17      PT SHORT TERM GOAL #2   Title  Patient ambulates 400' with crutches engaging prosthesis with supervision / verbal cues.     Status  New    Target Date  09/09/17      PT SHORT TERM GOAL #3   Title  Patient negotiates ramps, curbs with crutches & stairs with 1 rail/1 crutch with supervision.     Status  New    Target Date  09/09/17      PT SHORT TERM GOAL #4   Title  Patient stands 2 minutes without UE support, reaches 5" and to floor with supervision.     Status  New    Target Date  09/09/17        PT Long Term Goals - 06/30/17 1511      PT LONG TERM GOAL #1   Title  Patient demonstrates & verbalizes proper prosthetic care including dialysis issues to enable safe use of prosthesis. (All LTGs Target Date: 09/21/2017)    Time  12    Period  Weeks    Status  On-going    Target Date  09/21/17      PT LONG TERM GOAL #2   Title  Patient tolerates prosthesis wear >90% of awake hours without skin or limb pain issues to enable function throughout his day.     Time  12    Period  Weeks    Status  On-going    Target Date  09/21/17      PT LONG TERM GOAL #3   Title  Berg Balance with device >36/56 to indicate lower fall risk.     Time  12    Period  Weeks    Status  On-going    Target Date  09/21/17      PT LONG TERM GOAL #4   Title  Patient ambulates 1000' outdoors including grass, ramps & curbs with cane or less and prosthesis modified independent.     Time  12    Period  Weeks    Status  On-going    Target Date  09/21/17      PT LONG TERM GOAL #5   Title  Patient modified independent on stairs  including descending alternate pattern with 2 rails and step-to pattern with 1 rail & cane engaging prosthetic hydraulics.     Time  12    Period  Weeks    Status  On-going    Target Date  09/21/17      PT LONG TERM GOAL #6   Title  Patient demonstrates & verbalizes tasks with prosthesis to perform gardening & cooking modified independent to enable return to his hobbies.     Time  12    Period  Weeks    Status  On-going    Target Date  09/21/17         Plan - 08/16/17 2620    Clinical Impression Statement  Today's skilled session continued to address gait with bil forearm crutches, including barriers. Progressed to use of single crutch for gait on level surfaces today with up to min assist needed. Pt is progressing  toward goals and should benefit from continued PT to progress toward unmet goals.     Rehab Potential  Good    PT Frequency  2x / week    PT Duration  12 weeks    PT Treatment/Interventions  ADLs/Self Care Home Management;DME Instruction;Gait training;Stair training;Functional mobility training;Therapeutic activities;Therapeutic exercise;Balance training;Neuromuscular re-education;Canalith Repostioning;Patient/family education;Prosthetic Training;Vestibular    PT Next Visit Plan  prosthetic gait with forearm crutches including ramps/curbs. Gait outside with forearm crutches. Standing balance activities, work to engage hydraulic knee with all activities.     Consulted and Agree with Plan of Care  Patient       Patient will benefit from skilled therapeutic intervention in order to improve the following deficits and impairments:  Decreased activity tolerance, Abnormal gait, Decreased balance, Decreased endurance, Decreased knowledge of use of DME, Decreased mobility, Decreased strength, Prosthetic Dependency, Postural dysfunction, Dizziness  Visit Diagnosis: Muscle weakness (generalized)  Other abnormalities of gait and mobility  Unsteadiness on feet  Abnormal  posture     Problem List Patient Active Problem List   Diagnosis Date Noted  . Altered mental status   . Cerebral thrombosis with cerebral infarction 02/06/2017  . Cerebral embolism with cerebral infarction 02/06/2017  . Above knee amputation status, left ([place])   . Pressure injury of skin 01/30/2017  . Acute lower UTI 01/29/2017  . Acute metabolic encephalopathy 16/10/9602  . UTI (urinary tract infection) 01/29/2017  . Quadriceps muscle rupture, left, initial encounter   . Fall 01/09/2017  . Gait disturbance 01/09/2017  . Staphylococcus aureus infection 01/09/2017  . Infection of prosthetic left knee joint ([place]) 01/09/2017  . Fever, unknown origin 11/09/2016  . Critical lower limb ischemia 08/25/2016  . Ulcer of left midfoot with fat layer exposed ([place]) 08/13/2016  . Diabetic ulcer of left midfoot associated with type 2 diabetes mellitus, with fat layer exposed ([place]) 07/25/2016  . Peripheral neuropathy 07/22/2016  . Tobacco abuse 07/22/2016  . CAD in native artery 05/18/2016  . CAD, multiple vessel 05/11/2016  . Positive cardiac stress test 05/11/2016  . Abnormal stress test 04/30/2016  . Pre-transplant evaluation for kidney transplant 04/30/2016  . S/P revision of total knee 11/26/2015  . Pain in the chest   . Acute on chronic diastolic heart failure ([place]) 07/05/2015  . Volume overload 07/04/2015  . Shortness of breath 07/04/2015  . Hypoxemia 07/04/2015  . Elevated troponin   . End-stage renal disease on hemodialysis ([place])   . Hypervolemia   . 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 Pyogenic bacterial arthritis of knee, left ([place]) 08/07/2014  . Tachycardia 07/24/2014  . Acute upper respiratory infection 07/24/2014  . ESRD on dialysis ([place]) 07/14/2014  . Type II diabetes mellitus ([place]) 07/14/2014  . Anemia in chronic kidney disease 07/14/2014  . Congestive heart failure (CHF) ([place]) 07/13/2014  . Surgical wound dehiscence 05/09/2014  . Dehiscence of  closure of skin 05/09/2014  . Total knee replacement status 04/10/2014  . Diabetes mellitus with renal manifestations, controlled ([place]) 10/24/2013  . Hypertensive renal disease 06/27/2013  . DM type 2 causing vascular disease ([place]) 06/27/2013  . Erectile dysfunction 06/27/2013  . Depression 06/27/2013  . Claudication of left lower extremity ([place]) 12/19/2012  . Essential hypertension, benign 12/19/2012  . Sinusitis, acute maxillary 11/22/2012  . Otitis, externa, infective 11/14/2012  . Leg edema, left 11/14/2012  . End stage renal disease ([place]) 10/02/2012  . Controlled type 2 DM with proteinuria or microalbuminuria 09/19/2012  . GERD (gastroesophageal reflux disease) 09/19/2012  .  Leukocytosis 09/19/2012  . Lacunar infarction ([place]) 08/17/2012  . Polymyalgia rheumatica ([place]) 08/17/2012  . 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 Essential hypertension 05/10/2012  . Vitamin D deficiency 05/10/2012  . Diabetes mellitus due to underlying condition ([place]) 05/10/2012  . Hyperlipidemia LDL goal <100 05/10/2012  . Anemia of chronic disease 05/10/2012  . Screening for prostate cancer 05/10/2012  . Chronic kidney disease (CKD), stage IV (severe) ([place]) 05/10/2012  . Peripheral autonomic neuropathy due to DM ([place]) 05/10/2012  . Callus of foot 05/10/2012  . Urgency of urination 05/10/2012  . Hyperkalemia 05/10/2012  . Candidiasis of the esophagus 10/12/2011  . Internal hemorrhoids without mention of complication 40/35/2481  . Pre-syncope 07/25/2009  . DJD (degenerative joint disease) of cervical spine 02/17/2009    Willow Ora, PTA, [facility] [address] [place], [place] [address] [phone] 08/16/17, 11:47 AM   Name: Shabazz Mckey MRN: [id number] Date of Birth: 1960/03/22

## 2017-08-18 ENCOUNTER — Encounter: Payer: Self-pay | Admitting: Physical Therapy

## 2017-08-18 ENCOUNTER — Ambulatory Visit: Payer: Medicare HMO | Admitting: Physical Therapy

## 2017-08-18 DIAGNOSIS — R2689 Other abnormalities of gait and mobility: Secondary | ICD-10-CM

## 2017-08-18 DIAGNOSIS — M6281 Muscle weakness (generalized): Secondary | ICD-10-CM | POA: Diagnosis not present

## 2017-08-18 DIAGNOSIS — R2681 Unsteadiness on feet: Secondary | ICD-10-CM

## 2017-08-18 DIAGNOSIS — R293 Abnormal posture: Secondary | ICD-10-CM

## 2017-08-18 NOTE — Therapy (Signed)
[facility] [address] [place], [place], [address] Phone: [phone]   Fax:  [phone]  Physical Therapy Treatment  Patient Details  Name: Matthew Kline MRN: [id number] Date of Birth: January 20, 1960 Referring Provider: Meridee Score, MD   Encounter Date: 08/18/2017  PT End of Session - 08/18/17 1210    Visit Number  11    Number of Visits  25    Date for PT Re-Evaluation  09/21/17    Authorization Type  [employer]    PT Start Time  0850    PT Stop Time  0930    PT Time Calculation (min)  40 min    Equipment Utilized During Treatment  Gait belt    Activity Tolerance  Patient tolerated treatment well    Behavior During Therapy  [facility] for tasks assessed/performed       Past Medical History:  Diagnosis Date  . Anemia, unspecified   . Anxiety   . Arthralgia 2010   polyarticular  . Arthritis    "back, knees" (01/10/2017)  . Cancer [facility])    "kidney area" (01/10/2017)  . CHF (congestive heart failure) ([place]) 07/25/2009   denies  . Chronic lower back pain   . Coronary artery disease   . Coughing    pt. reports that he has drainage from sinus infection  . Diabetic foot ulcer ([place])   . Diabetic neuropathy ([place])   . ESRD (end stage renal disease) on dialysis [facility])    started 12/2012; "MWF; [facility] "  (01/10/2017)  . GERD (gastroesophageal reflux disease)    hx "before I lost weight", no problem 9 years  . Hemodialysis access site with mature fistula ([place])   . Hemorrhoids, internal 10/2011   small  . High cholesterol   . History of blood transfusion    "related to the anemia"  . Hypertension   . Insomnia, unspecified   . Lacunar infarction ([place]) 2006   RUE/RLE, speech  . Long term (current) use of anticoagulants   . Myocardial infarction ([place]) 1995  . Orthostatic hypotension   . Osteomyelitis of foot, left, acute ([place])   . Other chronic postoperative pain   . Pneumonia    "probably twice"  (01/10/2017)  . Polymyalgia rheumatica ([place])   . Renal insufficiency   . Sleep apnea    "lost weight; no more problem" (01/10/2017)  . Stroke ([place]) 01/10/06   denies residual on 05/09/2014  . Type II diabetes mellitus ([place]) dx'd 1995  . Unspecified hereditary and idiopathic peripheral neuropathy    feet  . Unspecified osteomyelitis, site unspecified   . Unspecified vitamin D deficiency     Past Surgical History:  Procedure Laterality Date  . ABDOMINAL AORTOGRAM N/A 08/25/2016   Procedure: ABDOMINAL AORTOGRAM;  Surgeon: Wellington Hampshire, MD;  Location: [place] [facility];  Service: Cardiovascular;  Laterality: N/A;  . AMPUTATION  01/21/2012   Procedure: AMPUTATION RAY;  Surgeon: [name], MD;  Location: [place];  Service: Orthopedics;  Laterality: Left;  Left Foot 4th Ray Amputation  . AMPUTATION Left 05/04/2013   Procedure: AMPUTATION DIGIT;  Surgeon: [name], MD;  Location: [place];  Service: Orthopedics;  Laterality: Left;  Left Great Toe Amputation at [facility]  . AMPUTATION Left 01/14/2017   Procedure: AMPUTATION ABOVE LEFT KNEE;  Surgeon: [name], MD;  Location: [place];  Service: Orthopedics;  Laterality: Left;  . ANTERIOR CERVICAL DECOMP/DISCECTOMY FUSION  02/2011  . BACK SURGERY    .  BASCILIC VEIN TRANSPOSITION Left 10/19/2012   Procedure: BASCILIC VEIN TRANSPOSITION;  Surgeon: Serafina Mitchell, MD;  Location: [place];  Service: Vascular;  Laterality: Left;  . CARDIAC CATHETERIZATION     "before bypass"  . CORONARY ARTERY BYPASS GRAFT     x 5 with lima at [place] WITH ANTIBIOTIC SPACERS Left 08/07/2014   Procedure: Replace Left Total Knee Arthroplasty,  Place Antibiotic Spacer;  Surgeon: [name], MD;  Location: [place];  Service: Orthopedics;  Laterality: Left;  . I&D EXTREMITY Left 05/09/2014   Procedure: Irrigation and Debridement Left Knee and Closure of Total Knee Arthroplasty Incision;  Surgeon: [name], MD;  Location: [facility];  Service: Orthopedics;  Laterality: Left;  . I&D KNEE WITH POLY EXCHANGE Left 05/31/2014   Procedure: IRRIGATION AND DEBRIDEMENT LEFT KNEE, PLACE ANTIBIOTIC BEADS,  POLY EXCHANGE;  Surgeon: [name], MD;  Location: [place];  Service: Orthopedics;  Laterality: Left;  . IRRIGATION AND DEBRIDEMENT KNEE Left 01/12/2017   Procedure: IRRIGATION AND DEBRIDEMENT LEFT KNEE;  Surgeon: [name], MD;  Location: [place];  Service: Orthopedics;  Laterality: Left;  . JOINT REPLACEMENT    . KNEE ARTHROSCOPY Left 08-25-2012  . LOWER EXTREMITY ANGIOGRAPHY Left 08/25/2016   Procedure: Lower Extremity Angiography;  Surgeon: Wellington Hampshire, MD;  Location: [place] [facility];  Service: Cardiovascular;  Laterality: Left;  . PERIPHERAL VASCULAR BALLOON ANGIOPLASTY Left 08/25/2016   Procedure: PERIPHERAL VASCULAR BALLOON ANGIOPLASTY;  Surgeon: Wellington Hampshire, MD;  Location: [facility];  Service: Cardiovascular;  Laterality: Left;  lt peroneal and ant tibial arteries cutting balloon  . REFRACTIVE SURGERY Bilateral   . TOE AMPUTATION Bilateral    "I've lost 7 toes over the last 7 years" (05/09/2014)  . TOE SURGERY Left April 2015   Big toe removed on left foot.  . TONSILLECTOMY    . TOTAL KNEE ARTHROPLASTY Left 04/10/2014   Procedure: TOTAL KNEE ARTHROPLASTY;  Surgeon: [name], MD;  Location: [place];  Service: Orthopedics;  Laterality: Left;  . TOTAL KNEE REVISION Left 10/25/2014   Procedure: LEFT TOTAL KNEE REVISION;  Surgeon: [name], MD;  Location: [place];  Service: Orthopedics;  Laterality: Left;  . TOTAL KNEE REVISION Left 11/26/2015   Procedure: Removal Left Total Knee Arthroplasty, Hinged Total Knee Arthroplasty;  Surgeon: [name], MD;  Location: [facility];  Service: Orthopedics;  Laterality: Left;  . UVULOPALATOPHARYNGOPLASTY, TONSILLECTOMY AND SEPTOPLASTY  ~ 1989  . WOUND DEBRIDEMENT Left 05/09/2014   Dehiscence Left Total Knee Arthroplasty Incision    There were no vitals  filed for this visit.  Subjective Assessment - 08/18/17 0852    Subjective  No new falls. Has appt with [name] at [facility] next Wed for any needed prosthetic adjustments.     Pertinent History  Left TFA, ESRD with dialysis M,W,F, CHF, CAD s/p CABG 05/2016, PAD, CVA, DM2, neuropathy, arthritis, right toe amputations    Limitations  Lifting;Standing;Walking;House hold activities    Patient Stated Goals  He wants to use prosthesis to walk without assistance, garden, cook / work in [employer]     Currently in Pain?  Yes    Pain Score  3     Pain Location  Leg    Pain Orientation  Left;Lateral    Pain Descriptors / Indicators  Aching    Pain Type  Chronic pain;Phantom pain    Pain Onset  More than  a month ago    Pain Frequency  Intermittent    Aggravating Factors   standing and gait    Pain Relieving Factors  sitting and rest             08/18/17 0855  Transfers  Transfers Sit to Stand;Stand to Sit  Sit to Stand 5: Supervision;With upper extremity assist;From chair/3-in-1  Sit to Stand Details Verbal cues for technique;Verbal cues for safe use of DME/AE  Stand to Sit 5: Supervision;With upper extremity assist;With armrests;To chair/3-in-1  Stand to Sit Details (indicate cue type and reason) Verbal cues for technique;Verbal cues for safe use of DME/AE  Ambulation/Gait  Ambulation/Gait Yes  Ambulation/Gait Assistance 5: Supervision;4: Min assist;4: Min guard  Ambulation/Gait Assistance Details contineud to focus on sequencing and knee engagement with use of bil forearm crutches on varied surfaces. HHA along with up to min assist for single crutch gait today due to instability/unsteadiness. cues on posture with all gait.   Ambulation Distance (Feet) 500 Feet (x1, 50 x2, plus around gym with other activities)  Assistive device Prosthesis;Lofstrands;R Forearm Crutch  Gait Pattern Step-to pattern;Decreased step length - right;Decreased stance time - left;Decreased stride length;Decreased hip/knee  flexion - left;Decreased weight shift to left;Left circumduction;Left hip hike;Antalgic;Lateral hip instability;Trunk flexed;Decreased trunk rotation;Abducted - left;Poor foot clearance - left  Ambulation Surface Level;Indoor  High Level Balance  High Level Balance Activities Negotitating around obstacles;Negotiating over obstacles  High Level Balance Comments 2 hoola hoops with 3 bolsters of vaired heights in a row: working on figure 8's around hoops with emphasis on pelvic alignment and step length to accomplish this, min gaurd assist for balance with bil forearm crutches; fwd stepping over blosters x 4 laps with bil crutches with cues on correct sequencing for safety and crutch placement; lateral stepping over higher bolsters x 2 resp with bil crutches with cues/min guard assist.                   Prosthetics  Prosthetic Care Comments  prosthesis continues to rotate inward with gait depite good pelvic postioning and socks donned. Pt see's prosthetist next week to have this addressed.   Current prosthetic wear tolerance (days/week)  daily  Current prosthetic wear tolerance (#hours/day)  reports all awake hours with a break somedays after dialysis for napping  Residual limb condition  no issues per pt report  Donning Prosthesis 5  Doffing Prosthesis 7      PT Short Term Goals - 08/18/17 1220      PT SHORT TERM GOAL #1   Title  Patient verbalizes adjusting ply socks for volume changes with minimal PT guidance. (This goal due 08/26/17)    Status  New    Target Date  08/26/17      PT SHORT TERM GOAL #2   Title  Patient ambulates 400' with crutches engaging prosthesis with supervision / verbal cues. (remaining LTGs due 09/09/17)    Status  New    Target Date  09/09/17      PT SHORT TERM GOAL #3   Title  Patient negotiates ramps, curbs with crutches & stairs with 1 rail/1 crutch with supervision.     Status  New      PT SHORT TERM GOAL #4   Title  Patient stands 2 minutes without UE  support, reaches 5" and to floor with supervision.     Status  New         PT Long Term Goals - 08/18/17 1223  PT LONG TERM GOAL #1   Title  Patient demonstrates & verbalizes proper prosthetic care including dialysis issues to enable safe use of prosthesis. (All LTGs Target Date: 09/21/2017)    Time  12    Period  Weeks    Status  On-going      PT LONG TERM GOAL #2   Title  Patient tolerates prosthesis wear >90% of awake hours without skin or limb pain issues to enable function throughout his day.     Time  12    Period  Weeks    Status  On-going      PT LONG TERM GOAL #3   Title  Oceanographer with device >36/56 to indicate lower fall risk.     Time  12    Period  Weeks    Status  On-going      PT LONG TERM GOAL #4   Title  Patient ambulates 1000' outdoors including grass, ramps & curbs with cane or less and prosthesis modified independent.     Time  12    Period  Weeks    Status  On-going      PT LONG TERM GOAL #5   Title  Patient modified independent on stairs including descending alternate pattern with 2 rails and step-to pattern with 1 rail & cane engaging prosthetic hydraulics.     Time  12    Period  Weeks    Status  On-going      PT LONG TERM GOAL #6   Title  Patient demonstrates & verbalizes tasks with prosthesis to perform gardening & cooking modified independent to enable return to his hobbies.     Time  12    Period  Weeks    Status  On-going                Plan - 08/18/17 0854    Clinical Impression Statement  Today's skilled session continued to focus on gait/mobility with bil to single forearm crutches. Pt needed slight more assist with single crutch today vs last session due to self reported fatigue. Pt is progressing toward goals and should benefit from continued PT to progress toward unmet goals.     Rehab Potential  Good    PT Frequency  2x / week    PT Duration  12 weeks    PT Treatment/Interventions  ADLs/Self Care Home Management;DME  Instruction;Gait training;Stair training;Functional mobility training;Therapeutic activities;Therapeutic exercise;Balance training;Neuromuscular re-education;Canalith Repostioning;Patient/family education;Prosthetic Training;Vestibular    PT Next Visit Plan  prosthetic gait with forearm crutches including ramps/curbs. Gait outside with forearm crutches. Standing balance activities, work to engage hydraulic knee with all activities.     Consulted and Agree with Plan of Care  Patient       Patient will benefit from skilled therapeutic intervention in order to improve the following deficits and impairments:  Decreased activity tolerance, Abnormal gait, Decreased balance, Decreased endurance, Decreased knowledge of use of DME, Decreased mobility, Decreased strength, Prosthetic Dependency, Postural dysfunction, Dizziness  Visit Diagnosis: Muscle weakness (generalized)  Other abnormalities of gait and mobility  Unsteadiness on feet  Abnormal posture     Problem List Patient Active Problem List   Diagnosis Date Noted  . Altered mental status   . Cerebral thrombosis with cerebral infarction 02/06/2017  . Cerebral embolism with cerebral infarction 02/06/2017  . Above knee amputation status, left ([place])   . Pressure injury of skin 01/30/2017  . Acute lower UTI 01/29/2017  . Acute metabolic encephalopathy 12/75/1700  .  UTI (urinary tract infection) 01/29/2017  . Quadriceps muscle rupture, left, initial encounter   . Fall 01/09/2017  . Gait disturbance 01/09/2017  . Staphylococcus aureus infection 01/09/2017  . Infection of prosthetic left knee joint ([place]) 01/09/2017  . Fever, unknown origin 11/09/2016  . Critical lower limb ischemia 08/25/2016  . Ulcer of left midfoot with fat layer exposed ([place]) 08/13/2016  . Diabetic ulcer of left midfoot associated with type 2 diabetes mellitus, with fat layer exposed ([place]) 07/25/2016  . Peripheral neuropathy 07/22/2016  . Tobacco abuse 07/22/2016  .  CAD in native artery 05/18/2016  . CAD, multiple vessel 05/11/2016  . Positive cardiac stress test 05/11/2016  . Abnormal stress test 04/30/2016  . Pre-transplant evaluation for kidney transplant 04/30/2016  . S/P revision of total knee 11/26/2015  . Pain in the chest   . Acute on chronic diastolic heart failure ([place]) 07/05/2015  . Volume overload 07/04/2015  . Shortness of breath 07/04/2015  . Hypoxemia 07/04/2015  . Elevated troponin   . End-stage renal disease on hemodialysis ([place])   . Hypervolemia   . 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 Pyogenic bacterial arthritis of knee, left ([place]) 08/07/2014  . Tachycardia 07/24/2014  . Acute upper respiratory infection 07/24/2014  . ESRD on dialysis ([place]) 07/14/2014  . Type II diabetes mellitus ([place]) 07/14/2014  . Anemia in chronic kidney disease 07/14/2014  . Congestive heart failure (CHF) ([place]) 07/13/2014  . Surgical wound dehiscence 05/09/2014  . Dehiscence of closure of skin 05/09/2014  . Total knee replacement status 04/10/2014  . Diabetes mellitus with renal manifestations, controlled ([place]) 10/24/2013  . Hypertensive renal disease 06/27/2013  . DM type 2 causing vascular disease ([place]) 06/27/2013  . Erectile dysfunction 06/27/2013  . Depression 06/27/2013  . Claudication of left lower extremity ([place]) 12/19/2012  . Essential hypertension, benign 12/19/2012  . Sinusitis, acute maxillary 11/22/2012  . Otitis, externa, infective 11/14/2012  . Leg edema, left 11/14/2012  . End stage renal disease ([place]) 10/02/2012  . Controlled type 2 DM with proteinuria or microalbuminuria 09/19/2012  . GERD (gastroesophageal reflux disease) 09/19/2012  . Leukocytosis 09/19/2012  . Lacunar infarction ([place]) 08/17/2012  . Polymyalgia rheumatica ([place]) 08/17/2012  . 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 Essential hypertension 05/10/2012  . Vitamin D deficiency 05/10/2012  . Diabetes mellitus due to underlying condition ([place]) 05/10/2012   . Hyperlipidemia LDL goal <100 05/10/2012  . Anemia of chronic disease 05/10/2012  . Screening for prostate cancer 05/10/2012  . Chronic kidney disease (CKD), stage IV (severe) ([place]) 05/10/2012  . Peripheral autonomic neuropathy due to DM ([place]) 05/10/2012  . Callus of foot 05/10/2012  . Urgency of urination 05/10/2012  . Hyperkalemia 05/10/2012  . Candidiasis of the esophagus 10/12/2011  . Internal hemorrhoids without mention of complication 17/51/0258  . Pre-syncope 07/25/2009  . DJD (degenerative joint disease) of cervical spine 02/17/2009    Willow Ora, PTA, [facility] [address] [place], [place] [address] [phone] 08/18/17, 12:24 PM   Name: Matthew Kline MRN: [id number] Date of Birth: Jun 11, 1960

## 2017-08-23 ENCOUNTER — Encounter: Payer: Self-pay | Admitting: Physical Therapy

## 2017-08-23 ENCOUNTER — Ambulatory Visit: Payer: Medicare HMO | Admitting: Physical Therapy

## 2017-08-23 DIAGNOSIS — M6281 Muscle weakness (generalized): Secondary | ICD-10-CM | POA: Diagnosis not present

## 2017-08-23 DIAGNOSIS — R2689 Other abnormalities of gait and mobility: Secondary | ICD-10-CM

## 2017-08-23 DIAGNOSIS — R2681 Unsteadiness on feet: Secondary | ICD-10-CM

## 2017-08-23 DIAGNOSIS — R293 Abnormal posture: Secondary | ICD-10-CM

## 2017-08-24 NOTE — Therapy (Signed)
[facility] [address] [place], [place], [address] Phone: [phone]   Fax:  [phone]  Physical Therapy Treatment  Patient Details  Name: Matthew Kline MRN: [id number] Date of Birth: [birth year] Referring Provider: Meridee Score, MD   Encounter Date: 08/23/2017  PT End of Session - 08/23/17 1103    Visit Number  12    Number of Visits  25    Date for PT Re-Evaluation  09/21/17    Authorization Type  [employer]    PT Start Time  0930    PT Stop Time  1015    PT Time Calculation (min)  45 min    Equipment Utilized During Treatment  Gait belt    Activity Tolerance  Patient tolerated treatment well    Behavior During Therapy  [facility] for tasks assessed/performed       Past Medical History:  Diagnosis Date  . Anemia, unspecified   . Anxiety   . Arthralgia 2010   polyarticular  . Arthritis    "back, knees" (01/10/2017)  . Cancer [facility])    "kidney area" (01/10/2017)  . CHF (congestive heart failure) ([place]) 07/25/2009   denies  . Chronic lower back pain   . Coronary artery disease   . Coughing    pt. reports that he has drainage from sinus infection  . Diabetic foot ulcer ([place])   . Diabetic neuropathy ([place])   . ESRD (end stage renal disease) on dialysis [facility])    started 12/2012; "MWF; [place] "  (01/10/2017)  . GERD (gastroesophageal reflux disease)    hx "before I lost weight", no problem 9 years  . Hemodialysis access site with mature fistula ([place])   . Hemorrhoids, internal 10/2011   small  . High cholesterol   . History of blood transfusion    "related to the anemia"  . Hypertension   . Insomnia, unspecified   . Lacunar infarction ([place]) 2006   RUE/RLE, speech  . Long term (current) use of anticoagulants   . Myocardial infarction ([place]) 1995  . Orthostatic hypotension   . Osteomyelitis of foot, left, acute ([place])   . Other chronic postoperative pain   . Pneumonia    "probably twice"  (01/10/2017)  . Polymyalgia rheumatica ([place])   . Renal insufficiency   . Sleep apnea    "lost weight; no more problem" (01/10/2017)  . Stroke ([place]) 01/10/06   denies residual on 05/09/2014  . Type II diabetes mellitus ([place]) dx'd 1995  . Unspecified hereditary and idiopathic peripheral neuropathy    feet  . Unspecified osteomyelitis, site unspecified   . Unspecified vitamin D deficiency     Past Surgical History:  Procedure Laterality Date  . ABDOMINAL AORTOGRAM N/A 08/25/2016   Procedure: ABDOMINAL AORTOGRAM;  Surgeon: Wellington Hampshire, MD;  Location: [place] [facility];  Service: Cardiovascular;  Laterality: N/A;  . AMPUTATION  01/21/2012   Procedure: AMPUTATION RAY;  Surgeon: [name], MD;  Location: [place];  Service: Orthopedics;  Laterality: Left;  Left Foot 4th Ray Amputation  . AMPUTATION Left 05/04/2013   Procedure: AMPUTATION DIGIT;  Surgeon: [name], MD;  Location: [place];  Service: Orthopedics;  Laterality: Left;  Left Great Toe Amputation at [facility]  . AMPUTATION Left 01/14/2017   Procedure: AMPUTATION ABOVE LEFT KNEE;  Surgeon: [name], MD;  Location: [place];  Service: Orthopedics;  Laterality: Left;  . ANTERIOR CERVICAL DECOMP/DISCECTOMY FUSION  02/2011  . BACK SURGERY    .  BASCILIC VEIN TRANSPOSITION Left 10/19/2012   Procedure: BASCILIC VEIN TRANSPOSITION;  Surgeon: Serafina Mitchell, MD;  Location: [facility];  Service: Vascular;  Laterality: Left;  . CARDIAC CATHETERIZATION     "before bypass"  . CORONARY ARTERY BYPASS GRAFT     x 5 with lima at [place] WITH ANTIBIOTIC SPACERS Left 08/07/2014   Procedure: Replace Left Total Knee Arthroplasty,  Place Antibiotic Spacer;  Surgeon: [name], MD;  Location: [facility];  Service: Orthopedics;  Laterality: Left;  . I&D EXTREMITY Left 05/09/2014   Procedure: Irrigation and Debridement Left Knee and Closure of Total Knee Arthroplasty Incision;  Surgeon: [name], MD;  Location: [place];  Service: Orthopedics;  Laterality: Left;  . I&D KNEE WITH POLY EXCHANGE Left 05/31/2014   Procedure: IRRIGATION AND DEBRIDEMENT LEFT KNEE, PLACE ANTIBIOTIC BEADS,  POLY EXCHANGE;  Surgeon: [name], MD;  Location: [place];  Service: Orthopedics;  Laterality: Left;  . IRRIGATION AND DEBRIDEMENT KNEE Left 01/12/2017   Procedure: IRRIGATION AND DEBRIDEMENT LEFT KNEE;  Surgeon: [name], MD;  Location: [place];  Service: Orthopedics;  Laterality: Left;  . JOINT REPLACEMENT    . KNEE ARTHROSCOPY Left 08-25-2012  . LOWER EXTREMITY ANGIOGRAPHY Left 08/25/2016   Procedure: Lower Extremity Angiography;  Surgeon: Wellington Hampshire, MD;  Location: [place] [facility];  Service: Cardiovascular;  Laterality: Left;  . PERIPHERAL VASCULAR BALLOON ANGIOPLASTY Left 08/25/2016   Procedure: PERIPHERAL VASCULAR BALLOON ANGIOPLASTY;  Surgeon: Wellington Hampshire, MD;  Location: [place] [facility];  Service: Cardiovascular;  Laterality: Left;  lt peroneal and ant tibial arteries cutting balloon  . REFRACTIVE SURGERY Bilateral   . TOE AMPUTATION Bilateral    "I've lost 7 toes over the last 7 years" (05/09/2014)  . TOE SURGERY Left April 2015   Big toe removed on left foot.  . TONSILLECTOMY    . TOTAL KNEE ARTHROPLASTY Left 04/10/2014   Procedure: TOTAL KNEE ARTHROPLASTY;  Surgeon: [name], MD;  Location: [place];  Service: Orthopedics;  Laterality: Left;  . TOTAL KNEE REVISION Left 10/25/2014   Procedure: LEFT TOTAL KNEE REVISION;  Surgeon: [name], MD;  Location: [place];  Service: Orthopedics;  Laterality: Left;  . TOTAL KNEE REVISION Left 11/26/2015   Procedure: Removal Left Total Knee Arthroplasty, Hinged Total Knee Arthroplasty;  Surgeon: [name], MD;  Location: [place];  Service: Orthopedics;  Laterality: Left;  . UVULOPALATOPHARYNGOPLASTY, TONSILLECTOMY AND SEPTOPLASTY  ~ 1989  . WOUND DEBRIDEMENT Left 05/09/2014   Dehiscence Left Total Knee Arthroplasty Incision    There were no vitals  filed for this visit.  Subjective Assessment - 08/23/17 0931    Subjective  No new falls. He is wearing prosthesis all awake hours.     Pertinent History  Left TFA, ESRD with dialysis M,W,F, CHF, CAD s/p CABG 05/2016, PAD, CVA, DM2, neuropathy, arthritis, right toe amputations    Limitations  Lifting;Standing;Walking;House hold activities    Patient Stated Goals  He wants to use prosthesis to walk without assistance, garden, cook / work in [employer]     Currently in Pain?  No/denies    Pain Onset  More than a month ago                       [facility] Adult PT Treatment/Exercise - 08/23/17 0930      Transfers   Transfers  Sit to Stand;Stand to  Sit    Sit to Stand  5: Supervision;With upper extremity assist;From chair/3-in-1;With armrests    Sit to Stand Details  Verbal cues for technique;Verbal cues for safe use of DME/AE    Sit to Stand Details (indicate cue type and reason)  verbal cues on engaging prosthesis & stabilizing without UE support    Stand to Sit  5: Supervision;With upper extremity assist;With armrests;To chair/3-in-1    Stand to Sit Details (indicate cue type and reason)  Verbal cues for technique;Verbal cues for safe use of DME/AE    Stand to Sit Details  verbal cues on using prosthetic hydraulics during descent      Ambulation/Gait   Ambulation/Gait  Yes    Ambulation/Gait Assistance  5: Supervision;4: Min assist   MinA single crutch & Supervision 2 crutches   Ambulation/Gait Assistance Details  tactile, verbal & demo cues on upright posture, step width (not abducting) and wt shift over prosthesis in stance.     Ambulation Distance (Feet)  100 Feet   100' X 3 1 crutch, 200' 2 crutches   Assistive device  Prosthesis;Lofstrands;R Forearm Crutch    Gait Pattern  Decreased step length - right;Decreased stance time - left;Decreased stride length;Decreased hip/knee flexion - left;Decreased weight shift to left;Left circumduction;Left hip hike;Antalgic;Lateral hip  instability;Trunk flexed;Decreased trunk rotation;Abducted - left;Poor foot clearance - left;Step-through pattern    Ambulation Surface  Indoor;Level    Stairs  Yes    Stairs Assistance  5: Supervision    Stairs Assistance Details (indicate cue type and reason)  demo & verbal cues on using prosthetic hydraulics to descend with step-to pattern.     Stair Management Technique  Two rails;Step to pattern;Forwards    Number of Stairs  4   5 reps   Ramp  5: Supervision   2 crutches & TFA microprocessor prosthesis   Ramp Details (indicate cue type and reason)  demo & verbal cues on proper step width, posture, wt shift over prosthesis & prosthetic knee flexion    Curb  5: Supervision   2 crutches & TFA microprocessor prosthesis   Curb Details (indicate cue type and reason)  verbal cues on fluency & step thru pattern      High Level Balance   High Level Balance Activities  Figure 8 turns;Negotitating around obstacles   2 crutches & TFA microprocessor prosthesis   High Level Balance Comments  demo & verbal cues on adjusting step length & pelvic orientation      Prosthetics   Prosthetic Care Comments   demo & verbal cues on donning with proper prosthesis & pelvic orientation when seating limb into socket to control rotation. Sitting on chairs with hammock bottom can rotate prosthesis and need to correct by unseating limb, rotate prosthesis, check pelvic orientation & reseat limb into socket.     Current prosthetic wear tolerance (days/week)   daily    Current prosthetic wear tolerance (#hours/day)   reports all awake hours with a break somedays after dialysis for napping    Residual limb condition   no issues per pt report    Education Provided  Proper Donning    Person(s) Educated  Patient    Education Method  Explanation;Demonstration;Tactile cues;Verbal cues    Education Method  Verbalized understanding;Returned demonstration;Needs further instruction               PT Short Term Goals  - 08/23/17 1800      PT SHORT TERM GOAL #1   Title  Patient verbalizes adjusting ply socks for volume changes with minimal PT guidance. (This goal due 08/26/17)    Baseline  MET 08/23/2017    Status  Achieved      PT SHORT TERM GOAL #2   Title  Patient ambulates 400' with crutches engaging prosthesis with supervision / verbal cues.     Status  On-going    Target Date  08/26/17      PT SHORT TERM GOAL #3   Title  Patient negotiates ramps, curbs with crutches & stairs with 1 rail/1 crutch with supervision.     Status  On-going    Target Date  08/26/17      PT SHORT TERM GOAL #4   Title  Patient stands 2 minutes without UE support, reaches 5" and to floor with supervision.     Baseline  MET 08/23/2017    Status  Achieved        PT Long Term Goals - 08/18/17 1223      PT LONG TERM GOAL #1   Title  Patient demonstrates & verbalizes proper prosthetic care including dialysis issues to enable safe use of prosthesis. (All LTGs Target Date: 09/21/2017)    Time  12    Period  Weeks    Status  On-going      PT LONG TERM GOAL #2   Title  Patient tolerates prosthesis wear >90% of awake hours without skin or limb pain issues to enable function throughout his day.     Time  12    Period  Weeks    Status  On-going      PT LONG TERM GOAL #3   Title  Oceanographer with device >36/56 to indicate lower fall risk.     Time  12    Period  Weeks    Status  On-going      PT LONG TERM GOAL #4   Title  Patient ambulates 1000' outdoors including grass, ramps & curbs with cane or less and prosthesis modified independent.     Time  12    Period  Weeks    Status  On-going      PT LONG TERM GOAL #5   Title  Patient modified independent on stairs including descending alternate pattern with 2 rails and step-to pattern with 1 rail & cane engaging prosthetic hydraulics.     Time  12    Period  Weeks    Status  On-going      PT LONG TERM GOAL #6   Title  Patient demonstrates & verbalizes tasks with  prosthesis to perform gardening & cooking modified independent to enable return to his hobbies.     Time  12    Period  Weeks    Status  On-going            Plan - 08/23/17 1800    Clinical Impression Statement  Today's skilled session focused on donning prosthesis with proper pelvic orientation to help control prosthetic rotation, barriers with 2 crutches & level indoor gait with single crutch.     Rehab Potential  Good    PT Frequency  2x / week    PT Duration  12 weeks    PT Treatment/Interventions  ADLs/Self Care Home Management;DME Instruction;Gait training;Stair training;Functional mobility training;Therapeutic activities;Therapeutic exercise;Balance training;Neuromuscular re-education;Canalith Repostioning;Patient/family education;Prosthetic Training;Vestibular    PT Next Visit Plan  check remaining 2 STGs,     Consulted and Agree with Plan of Care  Patient  Patient will benefit from skilled therapeutic intervention in order to improve the following deficits and impairments:  Decreased activity tolerance, Abnormal gait, Decreased balance, Decreased endurance, Decreased knowledge of use of DME, Decreased mobility, Decreased strength, Prosthetic Dependency, Postural dysfunction, Dizziness  Visit Diagnosis: Muscle weakness (generalized)  Other abnormalities of gait and mobility  Unsteadiness on feet  Abnormal posture     Problem List Patient Active Problem List   Diagnosis Date Noted  . Altered mental status   . Cerebral thrombosis with cerebral infarction 02/06/2017  . Cerebral embolism with cerebral infarction 02/06/2017  . Above knee amputation status, left ([place])   . Pressure injury of skin 01/30/2017  . Acute lower UTI 01/29/2017  . Acute metabolic encephalopathy 97/67/3419  . UTI (urinary tract infection) 01/29/2017  . Quadriceps muscle rupture, left, initial encounter   . Fall 01/09/2017  . Gait disturbance 01/09/2017  . Staphylococcus aureus  infection 01/09/2017  . Infection of prosthetic left knee joint ([place]) 01/09/2017  . Fever, unknown origin 11/09/2016  . Critical lower limb ischemia 08/25/2016  . Ulcer of left midfoot with fat layer exposed ([place]) 08/13/2016  . Diabetic ulcer of left midfoot associated with type 2 diabetes mellitus, with fat layer exposed ([place]) 07/25/2016  . Peripheral neuropathy 07/22/2016  . Tobacco abuse 07/22/2016  . CAD in native artery 05/18/2016  . CAD, multiple vessel 05/11/2016  . Positive cardiac stress test 05/11/2016  . Abnormal stress test 04/30/2016  . Pre-transplant evaluation for kidney transplant 04/30/2016  . S/P revision of total knee 11/26/2015  . Pain in the chest   . Acute on chronic diastolic heart failure ([place]) 07/05/2015  . Volume overload 07/04/2015  . Shortness of breath 07/04/2015  . Hypoxemia 07/04/2015  . Elevated troponin   . End-stage renal disease on hemodialysis ([place])   . Hypervolemia   . 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 Pyogenic bacterial arthritis of knee, left ([place]) 08/07/2014  . Tachycardia 07/24/2014  . Acute upper respiratory infection 07/24/2014  . ESRD on dialysis ([place]) 07/14/2014  . Type II diabetes mellitus ([place]) 07/14/2014  . Anemia in chronic kidney disease 07/14/2014  . Congestive heart failure (CHF) ([place]) 07/13/2014  . Surgical wound dehiscence 05/09/2014  . Dehiscence of closure of skin 05/09/2014  . Total knee replacement status 04/10/2014  . Diabetes mellitus with renal manifestations, controlled ([place]) 10/24/2013  . Hypertensive renal disease 06/27/2013  . DM type 2 causing vascular disease ([place]) 06/27/2013  . Erectile dysfunction 06/27/2013  . Depression 06/27/2013  . Claudication of left lower extremity ([place]) 12/19/2012  . Essential hypertension, benign 12/19/2012  . Sinusitis, acute maxillary 11/22/2012  . Otitis, externa, infective 11/14/2012  . Leg edema, left 11/14/2012  . End stage renal disease ([place]) 10/02/2012   . Controlled type 2 DM with proteinuria or microalbuminuria 09/19/2012  . GERD (gastroesophageal reflux disease) 09/19/2012  . Leukocytosis 09/19/2012  . Lacunar infarction ([place]) 08/17/2012  . Polymyalgia rheumatica ([place]) 08/17/2012  . 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 Essential hypertension 05/10/2012  . Vitamin D deficiency 05/10/2012  . Diabetes mellitus due to underlying condition ([place]) 05/10/2012  . Hyperlipidemia LDL goal <100 05/10/2012  . Anemia of chronic disease 05/10/2012  . Screening for prostate cancer 05/10/2012  . Chronic kidney disease (CKD), stage IV (severe) ([place]) 05/10/2012  . Peripheral autonomic neuropathy due to DM ([place]) 05/10/2012  . Callus of foot 05/10/2012  . Urgency of urination 05/10/2012  . Hyperkalemia 05/10/2012  . Candidiasis of the esophagus 10/12/2011  .  Internal hemorrhoids without mention of complication 92/01/69  . Pre-syncope 07/25/2009  . DJD (degenerative joint disease) of cervical spine 02/17/2009    Ahmira Boisselle PT, DPT 08/24/2017, 6:55 AM  [facility] [address] [place], [place], [address] Phone: [phone]   Fax:  [phone]  Name: Brad Lieurance MRN: [id number] Date of Birth: 1960-11-16

## 2017-08-25 ENCOUNTER — Ambulatory Visit: Payer: Medicare HMO | Admitting: Physical Therapy

## 2017-08-25 ENCOUNTER — Encounter: Payer: Self-pay | Admitting: Physical Therapy

## 2017-08-25 DIAGNOSIS — R2689 Other abnormalities of gait and mobility: Secondary | ICD-10-CM

## 2017-08-25 DIAGNOSIS — R293 Abnormal posture: Secondary | ICD-10-CM

## 2017-08-25 DIAGNOSIS — M6281 Muscle weakness (generalized): Secondary | ICD-10-CM

## 2017-08-25 DIAGNOSIS — R2681 Unsteadiness on feet: Secondary | ICD-10-CM

## 2017-08-26 NOTE — Therapy (Signed)
[facility] [address] [place], [place], [address] Phone: [phone]   Fax:  [phone]  Physical Therapy Treatment  Patient Details  Name: Matthew Kline MRN: [id number] Date of Birth: 1960-04-23 Referring Provider: Meridee Score, MD   Encounter Date: 08/25/2017  PT End of Session - 08/25/17 1025    Visit Number  13    Number of Visits  25    Date for PT Re-Evaluation  09/21/17    Authorization Type  [employer]    PT Start Time  0845    PT Stop Time  0930    PT Time Calculation (min)  45 min    Equipment Utilized During Treatment  Gait belt    Activity Tolerance  Patient tolerated treatment well    Behavior During Therapy  [facility] for tasks assessed/performed       Past Medical History:  Diagnosis Date  . Anemia, unspecified   . Anxiety   . Arthralgia 2010   polyarticular  . Arthritis    "back, knees" (01/10/2017)  . Cancer [facility])    "kidney area" (01/10/2017)  . CHF (congestive heart failure) ([place]) 07/25/2009   denies  . Chronic lower back pain   . Coronary artery disease   . Coughing    pt. reports that he has drainage from sinus infection  . Diabetic foot ulcer ([place])   . Diabetic neuropathy ([place])   . ESRD (end stage renal disease) on dialysis [facility])    started 12/2012; "MWF; [place] "  (01/10/2017)  . GERD (gastroesophageal reflux disease)    hx "before I lost weight", no problem 9 years  . Hemodialysis access site with mature fistula ([place])   . Hemorrhoids, internal 10/2011   small  . High cholesterol   . History of blood transfusion    "related to the anemia"  . Hypertension   . Insomnia, unspecified   . Lacunar infarction ([place]) 2006   RUE/RLE, speech  . Long term (current) use of anticoagulants   . Myocardial infarction ([place]) 1995  . Orthostatic hypotension   . Osteomyelitis of foot, left, acute ([place])   . Other chronic postoperative pain   . Pneumonia    "probably twice"  (01/10/2017)  . Polymyalgia rheumatica ([place])   . Renal insufficiency   . Sleep apnea    "lost weight; no more problem" (01/10/2017)  . Stroke ([place]) 01/10/06   denies residual on 05/09/2014  . Type II diabetes mellitus ([place]) dx'd 1995  . Unspecified hereditary and idiopathic peripheral neuropathy    feet  . Unspecified osteomyelitis, site unspecified   . Unspecified vitamin D deficiency     Past Surgical History:  Procedure Laterality Date  . ABDOMINAL AORTOGRAM N/A 08/25/2016   Procedure: ABDOMINAL AORTOGRAM;  Surgeon: Wellington Hampshire, MD;  Location: [place] CV LAB;  Service: Cardiovascular;  Laterality: N/A;  . AMPUTATION  01/21/2012   Procedure: AMPUTATION RAY;  Surgeon: [name], MD;  Location: [place];  Service: Orthopedics;  Laterality: Left;  Left Foot 4th Ray Amputation  . AMPUTATION Left 05/04/2013   Procedure: AMPUTATION DIGIT;  Surgeon: [name], MD;  Location: [place];  Service: Orthopedics;  Laterality: Left;  Left Great Toe Amputation at [facility]  . AMPUTATION Left 01/14/2017   Procedure: AMPUTATION ABOVE LEFT KNEE;  Surgeon: [name], MD;  Location: [place];  Service: Orthopedics;  Laterality: Left;  . ANTERIOR CERVICAL DECOMP/DISCECTOMY FUSION  02/2011  . BACK SURGERY    .  BASCILIC VEIN TRANSPOSITION Left 10/19/2012   Procedure: BASCILIC VEIN TRANSPOSITION;  Surgeon: Serafina Mitchell, MD;  Location: [place];  Service: Vascular;  Laterality: Left;  . CARDIAC CATHETERIZATION     "before bypass"  . CORONARY ARTERY BYPASS GRAFT     x 5 with lima at [place] WITH ANTIBIOTIC SPACERS Left 08/07/2014   Procedure: Replace Left Total Knee Arthroplasty,  Place Antibiotic Spacer;  Surgeon: [name], MD;  Location: [place];  Service: Orthopedics;  Laterality: Left;  . I&D EXTREMITY Left 05/09/2014   Procedure: Irrigation and Debridement Left Knee and Closure of Total Knee Arthroplasty Incision;  Surgeon: [name], MD;  Location: [place];  Service: Orthopedics;  Laterality: Left;  . I&D KNEE WITH POLY EXCHANGE Left 05/31/2014   Procedure: IRRIGATION AND DEBRIDEMENT LEFT KNEE, PLACE ANTIBIOTIC BEADS,  POLY EXCHANGE;  Surgeon: [name], MD;  Location: [place];  Service: Orthopedics;  Laterality: Left;  . IRRIGATION AND DEBRIDEMENT KNEE Left 01/12/2017   Procedure: IRRIGATION AND DEBRIDEMENT LEFT KNEE;  Surgeon: [name], MD;  Location: [place];  Service: Orthopedics;  Laterality: Left;  . JOINT REPLACEMENT    . KNEE ARTHROSCOPY Left 08-25-2012  . LOWER EXTREMITY ANGIOGRAPHY Left 08/25/2016   Procedure: Lower Extremity Angiography;  Surgeon: Wellington Hampshire, MD;  Location: [place] CV LAB;  Service: Cardiovascular;  Laterality: Left;  . PERIPHERAL VASCULAR BALLOON ANGIOPLASTY Left 08/25/2016   Procedure: PERIPHERAL VASCULAR BALLOON ANGIOPLASTY;  Surgeon: Wellington Hampshire, MD;  Location: [place] CV LAB;  Service: Cardiovascular;  Laterality: Left;  lt peroneal and ant tibial arteries cutting balloon  . REFRACTIVE SURGERY Bilateral   . TOE AMPUTATION Bilateral    "I've lost 7 toes over the last 7 years" (05/09/2014)  . TOE SURGERY Left April 2015   Big toe removed on left foot.  . TONSILLECTOMY    . TOTAL KNEE ARTHROPLASTY Left 04/10/2014   Procedure: TOTAL KNEE ARTHROPLASTY;  Surgeon: [name], MD;  Location: [place];  Service: Orthopedics;  Laterality: Left;  . TOTAL KNEE REVISION Left 10/25/2014   Procedure: LEFT TOTAL KNEE REVISION;  Surgeon: [name], MD;  Location: [place];  Service: Orthopedics;  Laterality: Left;  . TOTAL KNEE REVISION Left 11/26/2015   Procedure: Removal Left Total Knee Arthroplasty, Hinged Total Knee Arthroplasty;  Surgeon: [name], MD;  Location: [place];  Service: Orthopedics;  Laterality: Left;  . UVULOPALATOPHARYNGOPLASTY, TONSILLECTOMY AND SEPTOPLASTY  ~ 1989  . WOUND DEBRIDEMENT Left 05/09/2014   Dehiscence Left Total Knee Arthroplasty Incision    There were no vitals  filed for this visit.  Subjective Assessment - 08/25/17 0850    Subjective  No falls. He could not see prosthetist yesterday so rescheduled to today after PT.     Pertinent History  Left TFA, ESRD with dialysis M,W,F, CHF, CAD s/p CABG 05/2016, PAD, CVA, DM2, neuropathy, arthritis, right toe amputations    Limitations  Lifting;Standing;Walking;House hold activities    Patient Stated Goals  He wants to use prosthesis to walk without assistance, garden, cook / work in [employer]     Currently in Pain?  No/denies    Pain Onset  More than a month ago                       [facility] Adult PT Treatment/Exercise - 08/25/17 0845      Transfers   Transfers  Sit to Stand;Stand to Sit    Sit to Stand  5: Supervision;With upper extremity assist;From chair/3-in-1;With armrests    Sit to Stand Details  Verbal cues for technique;Verbal cues for safe use of DME/AE    Stand to Sit  5: Supervision;With upper extremity assist;With armrests;To chair/3-in-1    Stand to Sit Details (indicate cue type and reason)  Verbal cues for technique;Verbal cues for safe use of DME/AE      Ambulation/Gait   Ambulation/Gait  Yes    Ambulation/Gait Assistance  5: Supervision    Ambulation/Gait Assistance Details  verbal & tactile cues to carryover pre-gait focusing on full weight shift over prosthesis with sound limb terminal stance heel rise /knee flexion and equal stance duration.     Ambulation Distance (Feet)  400 Feet   including 50' over grass   Assistive device  Prosthesis;Lofstrands;R Forearm Crutch    Gait Pattern  Decreased step length - right;Decreased stance time - left;Decreased stride length;Decreased hip/knee flexion - left;Decreased weight shift to left;Left circumduction;Left hip hike;Antalgic;Lateral hip instability;Trunk flexed;Decreased trunk rotation;Abducted - left;Poor foot clearance - left;Step-through pattern    Ambulation Surface  Level;Indoor;Outdoor;Grass;Paved    Stairs  --    Production manager  --    Stair Management Technique  --    Number of Stairs  --    Ramp  5: Supervision   2 crutches & TFA microprocessor prosthesis   Curb  5: Supervision   2 crutches & TFA microprocessor prosthesis     High Level Balance   High Level Balance Activities  --    High Level Balance Comments  --      Neuro Re-ed    Neuro Re-ed Details   standing without UE support feet hip width apart:  PT cued on prosthetic foot position to facilitate maintaining prosthetic knee extension, tactile cues on pelvic weight shift to midline, reaching forward 5", red theraband alternate UE & BUE forward reach & row 10 reps each.       Prosthetics   Prosthetic Care Comments   --    Current prosthetic wear tolerance (days/week)   daily    Current prosthetic wear tolerance (#hours/day)   reports all awake hours with a break somedays after dialysis for napping    Residual limb condition   no issues per pt report    Education Provided  --               PT Short Term Goals - 08/25/17 1025      PT SHORT TERM GOAL #1   Title  Patient verbalizes adjusting ply socks for volume changes with minimal PT guidance. (This goal due 08/26/17)    Baseline  MET 08/23/2017    Status  Achieved      PT SHORT TERM GOAL #2   Title  Patient ambulates 400' with crutches engaging prosthesis with supervision / verbal cues.     Baseline  MET 08/25/2017    Status  Achieved      PT SHORT TERM GOAL #3   Title  Patient negotiates ramps, curbs with crutches & stairs with 1 rail/1 crutch with supervision.     Baseline  MET 08/25/2017    Status  Achieved      PT SHORT TERM GOAL #4   Title  Patient stands 2 minutes without UE support, reaches 5" and to floor with supervision.     Baseline  MET 08/23/2017    Status  Achieved  PT Long Term Goals - 08/18/17 1223      PT LONG TERM GOAL #1   Title  Patient demonstrates & verbalizes proper prosthetic care including dialysis issues to enable safe use of prosthesis.  (All LTGs Target Date: 09/21/2017)    Time  12    Period  Weeks    Status  On-going      PT LONG TERM GOAL #2   Title  Patient tolerates prosthesis wear >90% of awake hours without skin or limb pain issues to enable function throughout his day.     Time  12    Period  Weeks    Status  On-going      PT LONG TERM GOAL #3   Title  Oceanographer with device >36/56 to indicate lower fall risk.     Time  12    Period  Weeks    Status  On-going      PT LONG TERM GOAL #4   Title  Patient ambulates 1000' outdoors including grass, ramps & curbs with cane or less and prosthesis modified independent.     Time  12    Period  Weeks    Status  On-going      PT LONG TERM GOAL #5   Title  Patient modified independent on stairs including descending alternate pattern with 2 rails and step-to pattern with 1 rail & cane engaging prosthetic hydraulics.     Time  12    Period  Weeks    Status  On-going      PT LONG TERM GOAL #6   Title  Patient demonstrates & verbalizes tasks with prosthesis to perform gardening & cooking modified independent to enable return to his hobbies.     Time  12    Period  Weeks    Status  On-going            Plan - 08/25/17 1027    Clinical Impression Statement  Patient met all STGs set for this 30 day period. He improved prosthetic gait with skilled instruction in parallel bars using mirrors to see motion & facilitate upright posture. PT also addressed stationary standing position to equalize wt between feet and maintain prosthetic knee extension.     Rehab Potential  Good    PT Frequency  2x / week    PT Duration  12 weeks    PT Treatment/Interventions  ADLs/Self Care Home Management;DME Instruction;Gait training;Stair training;Functional mobility training;Therapeutic activities;Therapeutic exercise;Balance training;Neuromuscular re-education;Canalith Repostioning;Patient/family education;Prosthetic Training;Vestibular    PT Next Visit Plan  work towards LTGs,  prosthetic gait with cane.     Consulted and Agree with Plan of Care  Patient       Patient will benefit from skilled therapeutic intervention in order to improve the following deficits and impairments:  Decreased activity tolerance, Abnormal gait, Decreased balance, Decreased endurance, Decreased knowledge of use of DME, Decreased mobility, Decreased strength, Prosthetic Dependency, Postural dysfunction, Dizziness  Visit Diagnosis: Muscle weakness (generalized)  Other abnormalities of gait and mobility  Unsteadiness on feet  Abnormal posture     Problem List Patient Active Problem List   Diagnosis Date Noted  . Altered mental status   . Cerebral thrombosis with cerebral infarction 02/06/2017  . Cerebral embolism with cerebral infarction 02/06/2017  . Above knee amputation status, left ([place])   . Pressure injury of skin 01/30/2017  . Acute lower UTI 01/29/2017  . Acute metabolic encephalopathy 18/29/9371  . UTI (urinary tract infection) 01/29/2017  .  Quadriceps muscle rupture, left, initial encounter   . Fall 01/09/2017  . Gait disturbance 01/09/2017  . Staphylococcus aureus infection 01/09/2017  . Infection of prosthetic left knee joint ([place]) 01/09/2017  . Fever, unknown origin 11/09/2016  . Critical lower limb ischemia 08/25/2016  . Ulcer of left midfoot with fat layer exposed ([place]) 08/13/2016  . Diabetic ulcer of left midfoot associated with type 2 diabetes mellitus, with fat layer exposed ([place]) 07/25/2016  . Peripheral neuropathy 07/22/2016  . Tobacco abuse 07/22/2016  . CAD in native artery 05/18/2016  . CAD, multiple vessel 05/11/2016  . Positive cardiac stress test 05/11/2016  . Abnormal stress test 04/30/2016  . Pre-transplant evaluation for kidney transplant 04/30/2016  . S/P revision of total knee 11/26/2015  . Pain in the chest   . Acute on chronic diastolic heart failure ([place]) 07/05/2015  . Volume overload 07/04/2015  . Shortness of breath 07/04/2015  .  Hypoxemia 07/04/2015  . Elevated troponin   . End-stage renal disease on hemodialysis ([place])   . Hypervolemia   . 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 Pyogenic bacterial arthritis of knee, left ([place]) 08/07/2014  . Tachycardia 07/24/2014  . Acute upper respiratory infection 07/24/2014  . ESRD on dialysis ([place]) 07/14/2014  . Type II diabetes mellitus ([place]) 07/14/2014  . Anemia in chronic kidney disease 07/14/2014  . Congestive heart failure (CHF) ([place]) 07/13/2014  . Surgical wound dehiscence 05/09/2014  . Dehiscence of closure of skin 05/09/2014  . Total knee replacement status 04/10/2014  . Diabetes mellitus with renal manifestations, controlled ([place]) 10/24/2013  . Hypertensive renal disease 06/27/2013  . DM type 2 causing vascular disease ([place]) 06/27/2013  . Erectile dysfunction 06/27/2013  . Depression 06/27/2013  . Claudication of left lower extremity ([place]) 12/19/2012  . Essential hypertension, benign 12/19/2012  . Sinusitis, acute maxillary 11/22/2012  . Otitis, externa, infective 11/14/2012  . Leg edema, left 11/14/2012  . End stage renal disease ([place]) 10/02/2012  . Controlled type 2 DM with proteinuria or microalbuminuria 09/19/2012  . GERD (gastroesophageal reflux disease) 09/19/2012  . Leukocytosis 09/19/2012  . Lacunar infarction ([place]) 08/17/2012  . Polymyalgia rheumatica ([place]) 08/17/2012  . 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 Essential hypertension 05/10/2012  . Vitamin D deficiency 05/10/2012  . Diabetes mellitus due to underlying condition ([place]) 05/10/2012  . Hyperlipidemia LDL goal <100 05/10/2012  . Anemia of chronic disease 05/10/2012  . Screening for prostate cancer 05/10/2012  . Chronic kidney disease (CKD), stage IV (severe) ([place]) 05/10/2012  . Peripheral autonomic neuropathy due to DM ([place]) 05/10/2012  . Callus of foot 05/10/2012  . Urgency of urination 05/10/2012  . Hyperkalemia 05/10/2012  . Candidiasis of the esophagus 10/12/2011  .  Internal hemorrhoids without mention of complication 75/17/0017  . Pre-syncope 07/25/2009  . DJD (degenerative joint disease) of cervical spine 02/17/2009    Kimoni Pickerill PT, DPT 08/26/2017, 10:40 AM  [facility] [address] [place], [place], [address] Phone: [phone]   Fax:  [phone]  Name: Matthew Kline MRN: [id number] Date of Birth: Jun 26, 1960

## 2017-08-30 ENCOUNTER — Encounter: Payer: Self-pay | Admitting: Physical Therapy

## 2017-08-30 ENCOUNTER — Ambulatory Visit: Payer: Medicare HMO | Admitting: Physical Therapy

## 2017-08-30 DIAGNOSIS — R293 Abnormal posture: Secondary | ICD-10-CM

## 2017-08-30 DIAGNOSIS — M6281 Muscle weakness (generalized): Secondary | ICD-10-CM | POA: Diagnosis not present

## 2017-08-30 DIAGNOSIS — R2689 Other abnormalities of gait and mobility: Secondary | ICD-10-CM

## 2017-08-30 DIAGNOSIS — R2681 Unsteadiness on feet: Secondary | ICD-10-CM

## 2017-08-31 NOTE — Therapy (Signed)
[facility] [address] [place], [place], [address] Phone: [phone]   Fax:  [phone]  Physical Therapy Treatment  Patient Details  Name: Matthew Kline MRN: [id number] Date of Birth: 1960/01/30 Referring Provider: Meridee Score, MD   Encounter Date: 08/30/2017  PT End of Session - 08/30/17 1020    Visit Number  14    Number of Visits  25    Date for PT Re-Evaluation  09/21/17    Authorization Type  [employer]    PT Start Time  0930    PT Stop Time  1015    PT Time Calculation (min)  45 min    Equipment Utilized During Treatment  Gait belt    Activity Tolerance  Patient tolerated treatment well;Patient limited by fatigue    Behavior During Therapy  [facility] for tasks assessed/performed       Past Medical History:  Diagnosis Date  . Anemia, unspecified   . Anxiety   . Arthralgia 2010   polyarticular  . Arthritis    "back, knees" (01/10/2017)  . Cancer [facility])    "kidney area" (01/10/2017)  . CHF (congestive heart failure) ([place]) 07/25/2009   denies  . Chronic lower back pain   . Coronary artery disease   . Coughing    pt. reports that he has drainage from sinus infection  . Diabetic foot ulcer ([place])   . Diabetic neuropathy ([place])   . ESRD (end stage renal disease) on dialysis [facility])    started 12/2012; "MWF; [place] "  (01/10/2017)  . GERD (gastroesophageal reflux disease)    hx "before I lost weight", no problem 9 years  . Hemodialysis access site with mature fistula ([place])   . Hemorrhoids, internal 10/2011   small  . High cholesterol   . History of blood transfusion    "related to the anemia"  . Hypertension   . Insomnia, unspecified   . Lacunar infarction ([place]) 2006   RUE/RLE, speech  . Long term (current) use of anticoagulants   . Myocardial infarction ([place]) 1995  . Orthostatic hypotension   . Osteomyelitis of foot, left, acute ([place])   . Other chronic postoperative pain   .  Pneumonia    "probably twice" (01/10/2017)  . Polymyalgia rheumatica ([place])   . Renal insufficiency   . Sleep apnea    "lost weight; no more problem" (01/10/2017)  . Stroke ([place]) 01/10/06   denies residual on 05/09/2014  . Type II diabetes mellitus ([place]) dx'd 1995  . Unspecified hereditary and idiopathic peripheral neuropathy    feet  . Unspecified osteomyelitis, site unspecified   . Unspecified vitamin D deficiency     Past Surgical History:  Procedure Laterality Date  . ABDOMINAL AORTOGRAM N/A 08/25/2016   Procedure: ABDOMINAL AORTOGRAM;  Surgeon: Wellington Hampshire, MD;  Location: [facility];  Service: Cardiovascular;  Laterality: N/A;  . AMPUTATION  01/21/2012   Procedure: AMPUTATION RAY;  Surgeon: [name], MD;  Location: [facility];  Service: Orthopedics;  Laterality: Left;  Left Foot 4th Ray Amputation  . AMPUTATION Left 05/04/2013   Procedure: AMPUTATION DIGIT;  Surgeon: [name], MD;  Location: [facility];  Service: Orthopedics;  Laterality: Left;  Left Great Toe Amputation at [facility]  . AMPUTATION Left 01/14/2017   Procedure: AMPUTATION ABOVE LEFT KNEE;  Surgeon: [name], MD;  Location: [facility];  Service: Orthopedics;  Laterality: Left;  . ANTERIOR CERVICAL DECOMP/DISCECTOMY FUSION  02/2011  .  BACK SURGERY    . BASCILIC VEIN TRANSPOSITION Left 10/19/2012   Procedure: BASCILIC VEIN TRANSPOSITION;  Surgeon: Serafina Mitchell, MD;  Location: [facility];  Service: Vascular;  Laterality: Left;  . CARDIAC CATHETERIZATION     "before bypass"  . CORONARY ARTERY BYPASS GRAFT     x 5 with lima at [place] WITH ANTIBIOTIC SPACERS Left 08/07/2014   Procedure: Replace Left Total Knee Arthroplasty,  Place Antibiotic Spacer;  Surgeon: [name], MD;  Location: [facility];  Service: Orthopedics;  Laterality: Left;  . I&D EXTREMITY Left 05/09/2014   Procedure: Irrigation and Debridement Left Knee and Closure of Total Knee Arthroplasty Incision;  Surgeon:  [name], MD;  Location: [facility];  Service: Orthopedics;  Laterality: Left;  . I&D KNEE WITH POLY EXCHANGE Left 05/31/2014   Procedure: IRRIGATION AND DEBRIDEMENT LEFT KNEE, PLACE ANTIBIOTIC BEADS,  POLY EXCHANGE;  Surgeon: [name], MD;  Location: [facility];  Service: Orthopedics;  Laterality: Left;  . IRRIGATION AND DEBRIDEMENT KNEE Left 01/12/2017   Procedure: IRRIGATION AND DEBRIDEMENT LEFT KNEE;  Surgeon: [name], MD;  Location: [facility];  Service: Orthopedics;  Laterality: Left;  . JOINT REPLACEMENT    . KNEE ARTHROSCOPY Left 08-25-2012  . LOWER EXTREMITY ANGIOGRAPHY Left 08/25/2016   Procedure: Lower Extremity Angiography;  Surgeon: Wellington Hampshire, MD;  Location: [place] [facility];  Service: Cardiovascular;  Laterality: Left;  . PERIPHERAL VASCULAR BALLOON ANGIOPLASTY Left 08/25/2016   Procedure: PERIPHERAL VASCULAR BALLOON ANGIOPLASTY;  Surgeon: Wellington Hampshire, MD;  Location: [place];  Service: Cardiovascular;  Laterality: Left;  lt peroneal and ant tibial arteries cutting balloon  . REFRACTIVE SURGERY Bilateral   . TOE AMPUTATION Bilateral    "I've lost 7 toes over the last 7 years" (05/09/2014)  . TOE SURGERY Left April 2015   Big toe removed on left foot.  . TONSILLECTOMY    . TOTAL KNEE ARTHROPLASTY Left 04/10/2014   Procedure: TOTAL KNEE ARTHROPLASTY;  Surgeon: [name], MD;  Location: [facility];  Service: Orthopedics;  Laterality: Left;  . TOTAL KNEE REVISION Left 10/25/2014   Procedure: LEFT TOTAL KNEE REVISION;  Surgeon: [name], MD;  Location: [facility];  Service: Orthopedics;  Laterality: Left;  . TOTAL KNEE REVISION Left 11/26/2015   Procedure: Removal Left Total Knee Arthroplasty, Hinged Total Knee Arthroplasty;  Surgeon: [name], MD;  Location: [facility];  Service: Orthopedics;  Laterality: Left;  . UVULOPALATOPHARYNGOPLASTY, TONSILLECTOMY AND SEPTOPLASTY  ~ 1989  . WOUND DEBRIDEMENT Left 05/09/2014   Dehiscence Left Total Knee Arthroplasty  Incision    There were no vitals filed for this visit.  Subjective Assessment - 08/30/17 0930    Subjective  Yesterday dialysis was difficult and has to go back today to get more fluid off.     Pertinent History  Left TFA, ESRD with dialysis M,W,F, CHF, CAD s/p CABG 05/2016, PAD, CVA, DM2, neuropathy, arthritis, right toe amputations    Limitations  Lifting;Standing;Walking;House hold activities    Patient Stated Goals  He wants to use prosthesis to walk without assistance, garden, cook / work in [employer]     Currently in Pain?  No/denies    Pain Onset  More than a month ago                       [facility] Adult PT Treatment/Exercise - 08/30/17 0930  Transfers   Transfers  Sit to Stand;Stand to Sit    Sit to Stand  5: Supervision;4: Min assist;With upper extremity assist;With armrests;From chair/3-in-1   MinA to stabilize using cane   Sit to Stand Details  Verbal cues for technique;Verbal cues for safe use of DME/AE    Stand to Sit  5: Supervision;4: Min assist;With upper extremity assist;With armrests;To chair/3-in-1   MinA to stabilize using cane   Stand to Sit Details (indicate cue type and reason)  Verbal cues for technique;Verbal cues for safe use of DME/AE      Ambulation/Gait   Ambulation/Gait  Yes    Ambulation/Gait Assistance  5: Supervision;3: Mod assist   sup 2 crtuches, modA cane quad tip   Ambulation/Gait Assistance Details  warm-up in parallel bars working on weight shifting over prosthesis in stance;  initiated prosthetic gait with cane & hand hold, verbal & manual /tactile cues on sequence, posture, wt shift & step length    Ambulation Distance (Feet)  200 Feet   200' X 3 sound limb fatigue with cane   Assistive device  Prosthesis;Lofstrands;Straight cane   quad tip on cane   Gait Pattern  Decreased step length - right;Decreased stance time - left;Decreased stride length;Decreased hip/knee flexion - left;Decreased weight shift to left;Left  circumduction;Left hip hike;Antalgic;Lateral hip instability;Trunk flexed;Decreased trunk rotation;Abducted - left;Poor foot clearance - left;Step-through pattern    Ambulation Surface  Indoor;Level    Stairs  Yes    Stairs Assistance  5: Supervision    Stairs Assistance Details (indicate cue type and reason)  cues on cane use with single rail situtations: wide 2 rails, single right rail & single left rail;  verbal cues on wt over prosthesis in stance ascending & descending    Stair Management Technique  One rail Right;One rail Left;With cane;Step to pattern;Forwards    Number of Stairs  4   3 reps   Ramp  3: Mod assist   cane quad tip/HHA & TFA microprocessor prosthesis   Ramp Details (indicate cue type and reason)  verbal & manual /tactile cues on technique with cane/HHA    Curb  3: Mod assist   cane quad tip/HHA & TFA microprocessor prosthesis   Curb Details (indicate cue type and reason)  verbal & manual /tactile cues on technique with cane/HHA      High Level Balance   High Level Balance Activities  Direction changes   stationary stance with 90* turn rt & lt   High Level Balance Comments  initiated in bars with UE support decreasing: demo, verbal & tactile cues on technique;  progressed to cane/HHA modA      Neuro Re-ed    Neuro Re-ed Details   standing with back to doorframe, feet under pelvis equal step width: single UE overhead reach & BUE reach with 2-3 deep breaths      Prosthetics   Current prosthetic wear tolerance (days/week)   daily    Current prosthetic wear tolerance (#hours/day)   reports all awake hours with a break somedays after dialysis for napping    Residual limb condition   no issues per pt report               PT Short Term Goals - 08/25/17 1025      PT SHORT TERM GOAL #1   Title  Patient verbalizes adjusting ply socks for volume changes with minimal PT guidance. (This goal due 08/26/17)    Baseline  MET 08/23/2017  Status  Achieved      PT SHORT  TERM GOAL #2   Title  Patient ambulates 400' with crutches engaging prosthesis with supervision / verbal cues.     Baseline  MET 08/25/2017    Status  Achieved      PT SHORT TERM GOAL #3   Title  Patient negotiates ramps, curbs with crutches & stairs with 1 rail/1 crutch with supervision.     Baseline  MET 08/25/2017    Status  Achieved      PT SHORT TERM GOAL #4   Title  Patient stands 2 minutes without UE support, reaches 5" and to floor with supervision.     Baseline  MET 08/23/2017    Status  Achieved        PT Long Term Goals - 08/18/17 1223      PT LONG TERM GOAL #1   Title  Patient demonstrates & verbalizes proper prosthetic care including dialysis issues to enable safe use of prosthesis. (All LTGs Target Date: 09/21/2017)    Time  12    Period  Weeks    Status  On-going      PT LONG TERM GOAL #2   Title  Patient tolerates prosthesis wear >90% of awake hours without skin or limb pain issues to enable function throughout his day.     Time  12    Period  Weeks    Status  On-going      PT LONG TERM GOAL #3   Title  Oceanographer with device >36/56 to indicate lower fall risk.     Time  12    Period  Weeks    Status  On-going      PT LONG TERM GOAL #4   Title  Patient ambulates 1000' outdoors including grass, ramps & curbs with cane or less and prosthesis modified independent.     Time  12    Period  Weeks    Status  On-going      PT LONG TERM GOAL #5   Title  Patient modified independent on stairs including descending alternate pattern with 2 rails and step-to pattern with 1 rail & cane engaging prosthetic hydraulics.     Time  12    Period  Weeks    Status  On-going      PT LONG TERM GOAL #6   Title  Patient demonstrates & verbalizes tasks with prosthesis to perform gardening & cooking modified independent to enable return to his hobbies.     Time  12    Period  Weeks    Status  On-going            Plan - 08/30/17 1800    Clinical Impression Statement   Today's skilled session focused on introducing prosthetic gait with cane with hand hold moderate assist needed. Worked also on turning right & left from stationary position.     Rehab Potential  Good    PT Frequency  2x / week    PT Duration  12 weeks    PT Treatment/Interventions  ADLs/Self Care Home Management;DME Instruction;Gait training;Stair training;Functional mobility training;Therapeutic activities;Therapeutic exercise;Balance training;Neuromuscular re-education;Canalith Repostioning;Patient/family education;Prosthetic Training;Vestibular    PT Next Visit Plan  work towards LTGs, prosthetic gait with cane.     Consulted and Agree with Plan of Care  Patient       Patient will benefit from skilled therapeutic intervention in order to improve the following deficits and impairments:  Decreased activity tolerance,  Abnormal gait, Decreased balance, Decreased endurance, Decreased knowledge of use of DME, Decreased mobility, Decreased strength, Prosthetic Dependency, Postural dysfunction, Dizziness  Visit Diagnosis: Muscle weakness (generalized)  Other abnormalities of gait and mobility  Unsteadiness on feet  Abnormal posture     Problem List Patient Active Problem List   Diagnosis Date Noted  . Altered mental status   . Cerebral thrombosis with cerebral infarction 02/06/2017  . Cerebral embolism with cerebral infarction 02/06/2017  . Above knee amputation status, left ([place])   . Pressure injury of skin 01/30/2017  . Acute lower UTI 01/29/2017  . Acute metabolic encephalopathy 83/15/1761  . UTI (urinary tract infection) 01/29/2017  . Quadriceps muscle rupture, left, initial encounter   . Fall 01/09/2017  . Gait disturbance 01/09/2017  . Staphylococcus aureus infection 01/09/2017  . Infection of prosthetic left knee joint ([place]) 01/09/2017  . Fever, unknown origin 11/09/2016  . Critical lower limb ischemia 08/25/2016  . Ulcer of left midfoot with fat layer exposed ([place])  08/13/2016  . Diabetic ulcer of left midfoot associated with type 2 diabetes mellitus, with fat layer exposed ([place]) 07/25/2016  . Peripheral neuropathy 07/22/2016  . Tobacco abuse 07/22/2016  . CAD in native artery 05/18/2016  . CAD, multiple vessel 05/11/2016  . Positive cardiac stress test 05/11/2016  . Abnormal stress test 04/30/2016  . Pre-transplant evaluation for kidney transplant 04/30/2016  . S/P revision of total knee 11/26/2015  . Pain in the chest   . Acute on chronic diastolic heart failure ([place]) 07/05/2015  . Volume overload 07/04/2015  . Shortness of breath 07/04/2015  . Hypoxemia 07/04/2015  . Elevated troponin   . End-stage renal disease on hemodialysis ([place])   . Hypervolemia   . 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 Pyogenic bacterial arthritis of knee, left ([place]) 08/07/2014  . Tachycardia 07/24/2014  . Acute upper respiratory infection 07/24/2014  . ESRD on dialysis ([place]) 07/14/2014  . Type II diabetes mellitus ([place]) 07/14/2014  . Anemia in chronic kidney disease 07/14/2014  . Congestive heart failure (CHF) ([place]) 07/13/2014  . Surgical wound dehiscence 05/09/2014  . Dehiscence of closure of skin 05/09/2014  . Total knee replacement status 04/10/2014  . Diabetes mellitus with renal manifestations, controlled ([place]) 10/24/2013  . Hypertensive renal disease 06/27/2013  . DM type 2 causing vascular disease ([place]) 06/27/2013  . Erectile dysfunction 06/27/2013  . Depression 06/27/2013  . Claudication of left lower extremity ([place]) 12/19/2012  . Essential hypertension, benign 12/19/2012  . Sinusitis, acute maxillary 11/22/2012  . Otitis, externa, infective 11/14/2012  . Leg edema, left 11/14/2012  . End stage renal disease ([place]) 10/02/2012  . Controlled type 2 DM with proteinuria or microalbuminuria 09/19/2012  . GERD (gastroesophageal reflux disease) 09/19/2012  . Leukocytosis 09/19/2012  . Lacunar infarction ([place]) 08/17/2012  . Polymyalgia  rheumatica ([place]) 08/17/2012  . 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 Essential hypertension 05/10/2012  . Vitamin D deficiency 05/10/2012  . Diabetes mellitus due to underlying condition ([place]) 05/10/2012  . Hyperlipidemia LDL goal <100 05/10/2012  . Anemia of chronic disease 05/10/2012  . Screening for prostate cancer 05/10/2012  . Chronic kidney disease (CKD), stage IV (severe) ([place]) 05/10/2012  . Peripheral autonomic neuropathy due to DM ([place]) 05/10/2012  . Callus of foot 05/10/2012  . Urgency of urination 05/10/2012  . Hyperkalemia 05/10/2012  . Candidiasis of the esophagus 10/12/2011  . Internal hemorrhoids without mention of complication 60/73/7106  . Pre-syncope 07/25/2009  . DJD (degenerative joint disease) of cervical spine  02/17/2009    Brileigh Sevcik PT, DPT 08/31/2017, 8:18 AM  [facility] [address] [place], [place], [address] Phone: [phone]   Fax:  [phone]  Name: Pilar Corrales MRN: [id number] Date of Birth: Oct 18, 1960

## 2017-09-01 ENCOUNTER — Ambulatory Visit: Payer: Medicare HMO | Admitting: Physical Therapy

## 2017-09-01 ENCOUNTER — Encounter: Payer: Self-pay | Admitting: Physical Therapy

## 2017-09-01 DIAGNOSIS — R293 Abnormal posture: Secondary | ICD-10-CM

## 2017-09-01 DIAGNOSIS — M6281 Muscle weakness (generalized): Secondary | ICD-10-CM | POA: Diagnosis not present

## 2017-09-01 DIAGNOSIS — R2681 Unsteadiness on feet: Secondary | ICD-10-CM

## 2017-09-01 DIAGNOSIS — R2689 Other abnormalities of gait and mobility: Secondary | ICD-10-CM

## 2017-09-02 NOTE — Therapy (Signed)
[facility] [address] [place], [place], [address] Phone: [phone]   Fax:  [phone]  Physical Therapy Treatment  Patient Details  Name: Matthew Kline MRN: [id number] Date of Birth: 12/26/1960 Referring Provider: Meridee Score, MD   Encounter Date: 09/01/2017  PT End of Session - 09/01/17 0927    Visit Number  15    Number of Visits  25    Date for PT Re-Evaluation  09/21/17    Authorization Type  [employer]    PT Start Time  0845    PT Stop Time  0927    PT Time Calculation (min)  42 min    Equipment Utilized During Treatment  Gait belt    Activity Tolerance  Patient tolerated treatment well;Patient limited by fatigue    Behavior During Therapy  [facility] for tasks assessed/performed       Past Medical History:  Diagnosis Date  . Anemia, unspecified   . Anxiety   . Arthralgia 2010   polyarticular  . Arthritis    "back, knees" (01/10/2017)  . Cancer [facility])    "kidney area" (01/10/2017)  . CHF (congestive heart failure) ([place]) 07/25/2009   denies  . Chronic lower back pain   . Coronary artery disease   . Coughing    pt. reports that he has drainage from sinus infection  . Diabetic foot ulcer ([place])   . Diabetic neuropathy ([place])   . ESRD (end stage renal disease) on dialysis [facility])    started 12/2012; "MWF; [facility] "  (01/10/2017)  . GERD (gastroesophageal reflux disease)    hx "before I lost weight", no problem 9 years  . Hemodialysis access site with mature fistula ([place])   . Hemorrhoids, internal 10/2011   small  . High cholesterol   . History of blood transfusion    "related to the anemia"  . Hypertension   . Insomnia, unspecified   . Lacunar infarction ([place]) 2006   RUE/RLE, speech  . Long term (current) use of anticoagulants   . Myocardial infarction ([place]) 1995  . Orthostatic hypotension   . Osteomyelitis of foot, left, acute ([place])   . Other chronic postoperative pain   .  Pneumonia    "probably twice" (01/10/2017)  . Polymyalgia rheumatica ([place])   . Renal insufficiency   . Sleep apnea    "lost weight; no more problem" (01/10/2017)  . Stroke ([place]) 01/10/06   denies residual on 05/09/2014  . Type II diabetes mellitus ([place]) dx'd 1995  . Unspecified hereditary and idiopathic peripheral neuropathy    feet  . Unspecified osteomyelitis, site unspecified   . Unspecified vitamin D deficiency     Past Surgical History:  Procedure Laterality Date  . ABDOMINAL AORTOGRAM N/A 08/25/2016   Procedure: ABDOMINAL AORTOGRAM;  Surgeon: Wellington Hampshire, MD;  Location: [facility];  Service: Cardiovascular;  Laterality: N/A;  . AMPUTATION  01/21/2012   Procedure: AMPUTATION RAY;  Surgeon: [name], MD;  Location: [facility];  Service: Orthopedics;  Laterality: Left;  Left Foot 4th Ray Amputation  . AMPUTATION Left 05/04/2013   Procedure: AMPUTATION DIGIT;  Surgeon: [name], MD;  Location: [facility];  Service: Orthopedics;  Laterality: Left;  Left Great Toe Amputation at [facility]  . AMPUTATION Left 01/14/2017   Procedure: AMPUTATION ABOVE LEFT KNEE;  Surgeon: [name], MD;  Location: [facility];  Service: Orthopedics;  Laterality: Left;  . ANTERIOR CERVICAL DECOMP/DISCECTOMY FUSION  02/2011  .  BACK SURGERY    . BASCILIC VEIN TRANSPOSITION Left 10/19/2012   Procedure: BASCILIC VEIN TRANSPOSITION;  Surgeon: Serafina Mitchell, MD;  Location: [facility];  Service: Vascular;  Laterality: Left;  . CARDIAC CATHETERIZATION     "before bypass"  . CORONARY ARTERY BYPASS GRAFT     x 5 with lima at [place] WITH ANTIBIOTIC SPACERS Left 08/07/2014   Procedure: Replace Left Total Knee Arthroplasty,  Place Antibiotic Spacer;  Surgeon: [name], MD;  Location: [facility];  Service: Orthopedics;  Laterality: Left;  . I&D EXTREMITY Left 05/09/2014   Procedure: Irrigation and Debridement Left Knee and Closure of Total Knee Arthroplasty Incision;  Surgeon:  [name], MD;  Location: [facility];  Service: Orthopedics;  Laterality: Left;  . I&D KNEE WITH POLY EXCHANGE Left 05/31/2014   Procedure: IRRIGATION AND DEBRIDEMENT LEFT KNEE, PLACE ANTIBIOTIC BEADS,  POLY EXCHANGE;  Surgeon: [name], MD;  Location: [facility];  Service: Orthopedics;  Laterality: Left;  . IRRIGATION AND DEBRIDEMENT KNEE Left 01/12/2017   Procedure: IRRIGATION AND DEBRIDEMENT LEFT KNEE;  Surgeon: [name], MD;  Location: [facility];  Service: Orthopedics;  Laterality: Left;  . JOINT REPLACEMENT    . KNEE ARTHROSCOPY Left 08-25-2012  . LOWER EXTREMITY ANGIOGRAPHY Left 08/25/2016   Procedure: Lower Extremity Angiography;  Surgeon: Wellington Hampshire, MD;  Location: [place];  Service: Cardiovascular;  Laterality: Left;  . PERIPHERAL VASCULAR BALLOON ANGIOPLASTY Left 08/25/2016   Procedure: PERIPHERAL VASCULAR BALLOON ANGIOPLASTY;  Surgeon: Wellington Hampshire, MD;  Location: [place] [facility];  Service: Cardiovascular;  Laterality: Left;  lt peroneal and ant tibial arteries cutting balloon  . REFRACTIVE SURGERY Bilateral   . TOE AMPUTATION Bilateral    "I've lost 7 toes over the last 7 years" (05/09/2014)  . TOE SURGERY Left April 2015   Big toe removed on left foot.  . TONSILLECTOMY    . TOTAL KNEE ARTHROPLASTY Left 04/10/2014   Procedure: TOTAL KNEE ARTHROPLASTY;  Surgeon: [name], MD;  Location: [facility];  Service: Orthopedics;  Laterality: Left;  . TOTAL KNEE REVISION Left 10/25/2014   Procedure: LEFT TOTAL KNEE REVISION;  Surgeon: [name], MD;  Location: [facility];  Service: Orthopedics;  Laterality: Left;  . TOTAL KNEE REVISION Left 11/26/2015   Procedure: Removal Left Total Knee Arthroplasty, Hinged Total Knee Arthroplasty;  Surgeon: [name], MD;  Location: [facility];  Service: Orthopedics;  Laterality: Left;  . UVULOPALATOPHARYNGOPLASTY, TONSILLECTOMY AND SEPTOPLASTY  ~ 1989  . WOUND DEBRIDEMENT Left 05/09/2014   Dehiscence Left Total Knee Arthroplasty  Incision    There were no vitals filed for this visit.  Subjective Assessment - 09/01/17 0845    Subjective  He is wearing prosthesis without issues.     Pertinent History  Left TFA, ESRD with dialysis M,W,F, CHF, CAD s/p CABG 05/2016, PAD, CVA, DM2, neuropathy, arthritis, right toe amputations    Limitations  Lifting;Standing;Walking;House hold activities    Patient Stated Goals  He wants to use prosthesis to walk without assistance, garden, cook / work in [employer]     Currently in Pain?  No/denies    Pain Onset  More than a month ago                       [facility] Adult PT Treatment/Exercise - 09/01/17 0845      Transfers   Transfers  Sit to  Stand;Stand to Sit    Sit to Stand  5: Supervision;With upper extremity assist;With armrests;From chair/3-in-1    Sit to Stand Details  Verbal cues for technique;Verbal cues for safe use of DME/AE    Stand to Sit  5: Supervision;With upper extremity assist;With armrests;To chair/3-in-1    Stand to Sit Details (indicate cue type and reason)  Verbal cues for technique;Verbal cues for safe use of DME/AE      Ambulation/Gait   Ambulation/Gait  Yes    Ambulation/Gait Assistance  5: Supervision;3: Mod assist   sup 2 crtuches, modA cane quad tip   Ambulation/Gait Assistance Details  verbal & tactile cues on wt shift over prosthesis in stance, upright posture, step width,     Ambulation Distance (Feet)  200 Feet   200' X 3 sound limb fatigue with cane   Assistive device  Prosthesis;Straight cane;1 person hand held assist   quad tip & HHA or sliding hand along wall,   Gait Pattern  Decreased step length - right;Decreased stance time - left;Decreased stride length;Decreased hip/knee flexion - left;Decreased weight shift to left;Left circumduction;Left hip hike;Antalgic;Lateral hip instability;Trunk flexed;Decreased trunk rotation;Abducted - left;Poor foot clearance - left;Step-through pattern    Ambulation Surface  Indoor;Level    Stairs  --     Stairs Assistance  --    Stair Management Technique  --    Number of Stairs  --    Ramp  --    Curb  --      High Level Balance   High Level Balance Activities  Direction changes;Figure 8 turns   stationary stance with 90* turn rt & lt   High Level Balance Comments  initiated 90* turns rt & lt at counter: demo & cues on technique with TFA prosthesis & cane:  Figure 8's to negotiate around obstacles with demo & verbal cues on technique, pelvic orientation, step length      Self-Care   Self-Care  Lifting    Lifting  PT demo & instructed in picking up & placing items on floor using technique stand to sit to lower down & sit to stand to raise. Set-up with chair behind for visualization & safety and anteriorly chairback to touch if need (did not need after 1st)  Pt return demo 3 reps with improving balance      Neuro Re-ed    Neuro Re-ed Details   --      Prosthetics   Current prosthetic wear tolerance (days/week)   daily    Current prosthetic wear tolerance (#hours/day)   reports all awake hours with a break somedays after dialysis for napping    Residual limb condition   no issues per pt report               PT Short Term Goals - 08/25/17 1025      PT SHORT TERM GOAL #1   Title  Patient verbalizes adjusting ply socks for volume changes with minimal PT guidance. (This goal due 08/26/17)    Baseline  MET 08/23/2017    Status  Achieved      PT SHORT TERM GOAL #2   Title  Patient ambulates 400' with crutches engaging prosthesis with supervision / verbal cues.     Baseline  MET 08/25/2017    Status  Achieved      PT SHORT TERM GOAL #3   Title  Patient negotiates ramps, curbs with crutches & stairs with 1 rail/1 crutch with supervision.     Baseline  MET 08/25/2017    Status  Achieved      PT SHORT TERM GOAL #4   Title  Patient stands 2 minutes without UE support, reaches 5" and to floor with supervision.     Baseline  MET 08/23/2017    Status  Achieved        PT Long  Term Goals - 08/18/17 1223      PT LONG TERM GOAL #1   Title  Patient demonstrates & verbalizes proper prosthetic care including dialysis issues to enable safe use of prosthesis. (All LTGs Target Date: 09/21/2017)    Time  12    Period  Weeks    Status  On-going      PT LONG TERM GOAL #2   Title  Patient tolerates prosthesis wear >90% of awake hours without skin or limb pain issues to enable function throughout his day.     Time  12    Period  Weeks    Status  On-going      PT LONG TERM GOAL #3   Title  Oceanographer with device >36/56 to indicate lower fall risk.     Time  12    Period  Weeks    Status  On-going      PT LONG TERM GOAL #4   Title  Patient ambulates 1000' outdoors including grass, ramps & curbs with cane or less and prosthesis modified independent.     Time  12    Period  Weeks    Status  On-going      PT LONG TERM GOAL #5   Title  Patient modified independent on stairs including descending alternate pattern with 2 rails and step-to pattern with 1 rail & cane engaging prosthetic hydraulics.     Time  12    Period  Weeks    Status  On-going      PT LONG TERM GOAL #6   Title  Patient demonstrates & verbalizes tasks with prosthesis to perform gardening & cooking modified independent to enable return to his hobbies.     Time  12    Period  Weeks    Status  On-going            Plan - 09/01/17 2157    Clinical Impression Statement  Patient improved prosthetic gait with cane with skilled instruction in turning & negotiating around obstacles. Patient improved ability to pick up items from floor.    Rehab Potential  Good    PT Frequency  2x / week    PT Duration  12 weeks    PT Treatment/Interventions  ADLs/Self Care Home Management;DME Instruction;Gait training;Stair training;Functional mobility training;Therapeutic activities;Therapeutic exercise;Balance training;Neuromuscular re-education;Canalith Repostioning;Patient/family education;Prosthetic  Training;Vestibular    PT Next Visit Plan  work towards LTGs, prosthetic gait with cane.     Consulted and Agree with Plan of Care  Patient       Patient will benefit from skilled therapeutic intervention in order to improve the following deficits and impairments:  Decreased activity tolerance, Abnormal gait, Decreased balance, Decreased endurance, Decreased knowledge of use of DME, Decreased mobility, Decreased strength, Prosthetic Dependency, Postural dysfunction, Dizziness  Visit Diagnosis: Muscle weakness (generalized)  Other abnormalities of gait and mobility  Unsteadiness on feet  Abnormal posture     Problem List Patient Active Problem List   Diagnosis Date Noted  . Altered mental status   . Cerebral thrombosis with cerebral infarction 02/06/2017  . Cerebral embolism with cerebral infarction 02/06/2017  .  Above knee amputation status, left ([place])   . Pressure injury of skin 01/30/2017  . Acute lower UTI 01/29/2017  . Acute metabolic encephalopathy 04/88/8916  . UTI (urinary tract infection) 01/29/2017  . Quadriceps muscle rupture, left, initial encounter   . Fall 01/09/2017  . Gait disturbance 01/09/2017  . Staphylococcus aureus infection 01/09/2017  . Infection of prosthetic left knee joint ([place]) 01/09/2017  . Fever, unknown origin 11/09/2016  . Critical lower limb ischemia 08/25/2016  . Ulcer of left midfoot with fat layer exposed ([place]) 08/13/2016  . Diabetic ulcer of left midfoot associated with type 2 diabetes mellitus, with fat layer exposed ([place]) 07/25/2016  . Peripheral neuropathy 07/22/2016  . Tobacco abuse 07/22/2016  . CAD in native artery 05/18/2016  . CAD, multiple vessel 05/11/2016  . Positive cardiac stress test 05/11/2016  . Abnormal stress test 04/30/2016  . Pre-transplant evaluation for kidney transplant 04/30/2016  . S/P revision of total knee 11/26/2015  . Pain in the chest   . Acute on chronic diastolic heart failure ([place]) 07/05/2015  .  Volume overload 07/04/2015  . Shortness of breath 07/04/2015  . Hypoxemia 07/04/2015  . Elevated troponin   . End-stage renal disease on hemodialysis ([place])   . Hypervolemia   . 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 Pyogenic bacterial arthritis of knee, left ([place]) 08/07/2014  . Tachycardia 07/24/2014  . Acute upper respiratory infection 07/24/2014  . ESRD on dialysis ([place]) 07/14/2014  . Type II diabetes mellitus ([place]) 07/14/2014  . Anemia in chronic kidney disease 07/14/2014  . Congestive heart failure (CHF) ([place]) 07/13/2014  . Surgical wound dehiscence 05/09/2014  . Dehiscence of closure of skin 05/09/2014  . Total knee replacement status 04/10/2014  . Diabetes mellitus with renal manifestations, controlled ([place]) 10/24/2013  . Hypertensive renal disease 06/27/2013  . DM type 2 causing vascular disease ([place]) 06/27/2013  . Erectile dysfunction 06/27/2013  . Depression 06/27/2013  . Claudication of left lower extremity ([place]) 12/19/2012  . Essential hypertension, benign 12/19/2012  . Sinusitis, acute maxillary 11/22/2012  . Otitis, externa, infective 11/14/2012  . Leg edema, left 11/14/2012  . End stage renal disease ([place]) 10/02/2012  . Controlled type 2 DM with proteinuria or microalbuminuria 09/19/2012  . GERD (gastroesophageal reflux disease) 09/19/2012  . Leukocytosis 09/19/2012  . Lacunar infarction ([place]) 08/17/2012  . Polymyalgia rheumatica ([place]) 08/17/2012  . 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 Essential hypertension 05/10/2012  . Vitamin D deficiency 05/10/2012  . Diabetes mellitus due to underlying condition ([place]) 05/10/2012  . Hyperlipidemia LDL goal <100 05/10/2012  . Anemia of chronic disease 05/10/2012  . Screening for prostate cancer 05/10/2012  . Chronic kidney disease (CKD), stage IV (severe) ([place]) 05/10/2012  . Peripheral autonomic neuropathy due to DM ([place]) 05/10/2012  . Callus of foot 05/10/2012  . Urgency of urination 05/10/2012  .  Hyperkalemia 05/10/2012  . Candidiasis of the esophagus 10/12/2011  . Internal hemorrhoids without mention of complication 94/50/3888  . Pre-syncope 07/25/2009  . DJD (degenerative joint disease) of cervical spine 02/17/2009    Salvatore Shear PT, DPT 09/02/2017, 5:46 AM  [facility] [address] [place], [place], [address] Phone: [phone]   Fax:  [phone]  Name: Alexsander Cavins MRN: [id number] Date of Birth: [birth year]

## 2017-09-06 ENCOUNTER — Ambulatory Visit: Payer: Medicare HMO | Admitting: Physical Therapy

## 2017-09-08 ENCOUNTER — Ambulatory Visit: Payer: Medicare HMO | Admitting: Physical Therapy

## 2017-09-13 ENCOUNTER — Ambulatory Visit: Payer: Medicare HMO | Attending: Orthopedic Surgery | Admitting: Physical Therapy

## 2017-09-13 ENCOUNTER — Encounter: Payer: Self-pay | Admitting: Physical Therapy

## 2017-09-13 DIAGNOSIS — Z9181 History of falling: Secondary | ICD-10-CM | POA: Insufficient documentation

## 2017-09-13 DIAGNOSIS — M6281 Muscle weakness (generalized): Secondary | ICD-10-CM | POA: Diagnosis present

## 2017-09-13 DIAGNOSIS — R2689 Other abnormalities of gait and mobility: Secondary | ICD-10-CM | POA: Diagnosis present

## 2017-09-13 DIAGNOSIS — R293 Abnormal posture: Secondary | ICD-10-CM | POA: Diagnosis present

## 2017-09-13 DIAGNOSIS — R2681 Unsteadiness on feet: Secondary | ICD-10-CM | POA: Insufficient documentation

## 2017-09-13 NOTE — Therapy (Signed)
[facility] [address] [place], [place], [address] Phone: [phone]   Fax:  [phone]  Physical Therapy Treatment  Patient Details  Name: Matthew Kline MRN: [id number] Date of Birth: April 22, 1960 Referring Provider: Meridee Score, MD   Encounter Date: 09/13/2017  PT End of Session - 09/13/17 0852    Visit Number  16    Number of Visits  25    Date for PT Re-Evaluation  09/21/17    Authorization Type  [employer]    PT Start Time  0848    PT Stop Time  0930    PT Time Calculation (min)  42 min    Equipment Utilized During Treatment  Gait belt    Activity Tolerance  Patient tolerated treatment well;Patient limited by fatigue;No increased pain    Behavior During Therapy  WFL for tasks assessed/performed       Past Medical History:  Diagnosis Date  . Anemia, unspecified   . Anxiety   . Arthralgia 2010   polyarticular  . Arthritis    "back, knees" (01/10/2017)  . Cancer [facility])    "kidney area" (01/10/2017)  . CHF (congestive heart failure) ([place]) 07/25/2009   denies  . Chronic lower back pain   . Coronary artery disease   . Coughing    pt. reports that he has drainage from sinus infection  . Diabetic foot ulcer ([place])   . Diabetic neuropathy ([place])   . ESRD (end stage renal disease) on dialysis [facility])    started 12/2012; "MWF; [facility] "  (01/10/2017)  . GERD (gastroesophageal reflux disease)    hx "before I lost weight", no problem 9 years  . Hemodialysis access site with mature fistula ([place])   . Hemorrhoids, internal 10/2011   small  . High cholesterol   . History of blood transfusion    "related to the anemia"  . Hypertension   . Insomnia, unspecified   . Lacunar infarction ([place]) 2006   RUE/RLE, speech  . Long term (current) use of anticoagulants   . Myocardial infarction ([place]) 1995  . Orthostatic hypotension   . Osteomyelitis of foot, left, acute ([place])   . Other chronic  postoperative pain   . Pneumonia    "probably twice" (01/10/2017)  . Polymyalgia rheumatica ([place])   . Renal insufficiency   . Sleep apnea    "lost weight; no more problem" (01/10/2017)  . Stroke ([place]) 01/10/06   denies residual on 05/09/2014  . Type II diabetes mellitus ([place]) dx'd 1995  . Unspecified hereditary and idiopathic peripheral neuropathy    feet  . Unspecified osteomyelitis, site unspecified   . Unspecified vitamin D deficiency     Past Surgical History:  Procedure Laterality Date  . ABDOMINAL AORTOGRAM N/A 08/25/2016   Procedure: ABDOMINAL AORTOGRAM;  Surgeon: Wellington Hampshire, MD;  Location: [place] [facility];  Service: Cardiovascular;  Laterality: N/A;  . AMPUTATION  01/21/2012   Procedure: AMPUTATION RAY;  Surgeon: [name], MD;  Location: [facility];  Service: Orthopedics;  Laterality: Left;  Left Foot 4th Ray Amputation  . AMPUTATION Left 05/04/2013   Procedure: AMPUTATION DIGIT;  Surgeon: [name], MD;  Location: [facility];  Service: Orthopedics;  Laterality: Left;  Left Great Toe Amputation at [facility]  . AMPUTATION Left 01/14/2017   Procedure: AMPUTATION ABOVE LEFT KNEE;  Surgeon: [name], MD;  Location: [facility];  Service: Orthopedics;  Laterality: Left;  . ANTERIOR CERVICAL DECOMP/DISCECTOMY FUSION  02/2011  .  BACK SURGERY    . BASCILIC VEIN TRANSPOSITION Left 10/19/2012   Procedure: BASCILIC VEIN TRANSPOSITION;  Surgeon: Serafina Mitchell, MD;  Location: [facility];  Service: Vascular;  Laterality: Left;  . CARDIAC CATHETERIZATION     "before bypass"  . CORONARY ARTERY BYPASS GRAFT     x 5 with lima at [place] WITH ANTIBIOTIC SPACERS Left 08/07/2014   Procedure: Replace Left Total Knee Arthroplasty,  Place Antibiotic Spacer;  Surgeon: [name], MD;  Location: [facility];  Service: Orthopedics;  Laterality: Left;  . I&D EXTREMITY Left 05/09/2014   Procedure: Irrigation and Debridement Left Knee and Closure of Total Knee  Arthroplasty Incision;  Surgeon: [name], MD;  Location: [facility];  Service: Orthopedics;  Laterality: Left;  . I&D KNEE WITH POLY EXCHANGE Left 05/31/2014   Procedure: IRRIGATION AND DEBRIDEMENT LEFT KNEE, PLACE ANTIBIOTIC BEADS,  POLY EXCHANGE;  Surgeon: [name], MD;  Location: [facility];  Service: Orthopedics;  Laterality: Left;  . IRRIGATION AND DEBRIDEMENT KNEE Left 01/12/2017   Procedure: IRRIGATION AND DEBRIDEMENT LEFT KNEE;  Surgeon: [name], MD;  Location: [facility];  Service: Orthopedics;  Laterality: Left;  . JOINT REPLACEMENT    . KNEE ARTHROSCOPY Left 08-25-2012  . LOWER EXTREMITY ANGIOGRAPHY Left 08/25/2016   Procedure: Lower Extremity Angiography;  Surgeon: Wellington Hampshire, MD;  Location: [place] [facility];  Service: Cardiovascular;  Laterality: Left;  . PERIPHERAL VASCULAR BALLOON ANGIOPLASTY Left 08/25/2016   Procedure: PERIPHERAL VASCULAR BALLOON ANGIOPLASTY;  Surgeon: Wellington Hampshire, MD;  Location: [place] [facility];  Service: Cardiovascular;  Laterality: Left;  lt peroneal and ant tibial arteries cutting balloon  . REFRACTIVE SURGERY Bilateral   . TOE AMPUTATION Bilateral    "I've lost 7 toes over the last 7 years" (05/09/2014)  . TOE SURGERY Left April 2015   Big toe removed on left foot.  . TONSILLECTOMY    . TOTAL KNEE ARTHROPLASTY Left 04/10/2014   Procedure: TOTAL KNEE ARTHROPLASTY;  Surgeon: [name], MD;  Location: [facility];  Service: Orthopedics;  Laterality: Left;  . TOTAL KNEE REVISION Left 10/25/2014   Procedure: LEFT TOTAL KNEE REVISION;  Surgeon: [name], MD;  Location: [facility];  Service: Orthopedics;  Laterality: Left;  . TOTAL KNEE REVISION Left 11/26/2015   Procedure: Removal Left Total Knee Arthroplasty, Hinged Total Knee Arthroplasty;  Surgeon: [name], MD;  Location: [facility];  Service: Orthopedics;  Laterality: Left;  . UVULOPALATOPHARYNGOPLASTY, TONSILLECTOMY AND SEPTOPLASTY  ~ 1989  . WOUND DEBRIDEMENT Left 05/09/2014   Dehiscence  Left Total Knee Arthroplasty Incision    There were no vitals filed for this visit.  Subjective Assessment - 09/13/17 0852    Subjective  No new complaints. No falls to report.     Pertinent History  Left TFA, ESRD with dialysis M,W,F, CHF, CAD s/p CABG 05/2016, PAD, CVA, DM2, neuropathy, arthritis, right toe amputations    Limitations  Lifting;Standing;Walking;House hold activities    Patient Stated Goals  He wants to use prosthesis to walk without assistance, garden, cook / work in [employer]     Currently in Pain?  No/denies    Pain Score  0-No pain         [facility] Adult PT Treatment/Exercise - 09/13/17 0853      Transfers   Transfers  Sit to Stand;Stand to Sit    Sit to Stand  5: Supervision;With upper extremity assist;With armrests;From  chair/3-in-1    Sit to Stand Details  Verbal cues for technique;Verbal cues for safe use of DME/AE    Stand to Sit  5: Supervision;With upper extremity assist;With armrests;To chair/3-in-1    Stand to Sit Details (indicate cue type and reason)  Verbal cues for technique;Verbal cues for safe use of DME/AE      Ambulation/Gait   Ambulation/Gait  Yes    Ambulation/Gait Assistance  4: Min assist;3: Mod assist    Ambulation/Gait Assistance Details  cues on posture, weight shifting and step length. min to mod assist needed for balance with use of cane.     Ambulation Distance (Feet)  130 Feet   x1, plus around gym with activities   Assistive device  Prosthesis;Straight cane;1 person hand held assist    Gait Pattern  Decreased step length - right;Decreased stance time - left;Decreased stride length;Decreased hip/knee flexion - left;Decreased weight shift to left;Left circumduction;Left hip hike;Antalgic;Lateral hip instability;Trunk flexed;Decreased trunk rotation;Abducted - left;Poor foot clearance - left;Step-through pattern    Ambulation Surface  Level;Indoor    Stairs  Yes    Stairs Assistance  5: Supervision    Stairs Assistance Details (indicate cue  type and reason)  rail/cane combo with cues on sequencing, weight shfting needed.     Stair Management Technique  One rail Right;One rail Left;With cane;Step to pattern;Forwards    Number of Stairs  4   x2  reps     High Level Balance   High Level Balance Activities  Backward walking;Side stepping    High Level Balance Comments  in parallel bars for safety with intermittent touch to bars for balance. cues on posture, weight shifitng and ex technique. Performed each one for 3 laps each way.       Prosthetics   Current prosthetic wear tolerance (days/week)   daily    Current prosthetic wear tolerance (#hours/day)   reports all awake hours with a break somedays after dialysis for napping    Residual limb condition   no issues per pt report    Donning Prosthesis  Supervision    Doffing Prosthesis  Modified independent (device/increased time)          Balance Exercises - 09/13/17 1000      Balance Exercises: Standing   Standing Eyes Closed  Narrow base of support (BOS);Foam/compliant surface;30 secs;2 reps;Limitations    Tandem Stance  Eyes closed;Foam/compliant surface;2 reps;30 secs;Limitations      Balance Exercises: Standing   Standing Eyes Closed Limitations  on airex with feet hip width apart- EC no head movements for 30 sec's x 2 reps, min assist for balance with cues on posture and weight shifitng to assist with balance recovery    Tandem Stance Limitations  on airex with no UE support: 2 reps each foot fwd for EC no head movements, min assist with cues on weight shifting to assist with balance.           PT Short Term Goals - 08/25/17 1025      PT SHORT TERM GOAL #1   Title  Patient verbalizes adjusting ply socks for volume changes with minimal PT guidance. (This goal due 08/26/17)    Baseline  MET 08/23/2017    Status  Achieved      PT SHORT TERM GOAL #2   Title  Patient ambulates 400' with crutches engaging prosthesis with supervision / verbal cues.     Baseline  MET  08/25/2017    Status  Achieved  PT SHORT TERM GOAL #3   Title  Patient negotiates ramps, curbs with crutches & stairs with 1 rail/1 crutch with supervision.     Baseline  MET 08/25/2017    Status  Achieved      PT SHORT TERM GOAL #4   Title  Patient stands 2 minutes without UE support, reaches 5" and to floor with supervision.     Baseline  MET 08/23/2017    Status  Achieved        PT Long Term Goals - 08/18/17 1223      PT LONG TERM GOAL #1   Title  Patient demonstrates & verbalizes proper prosthetic care including dialysis issues to enable safe use of prosthesis. (All LTGs Target Date: 09/21/2017)    Time  12    Period  Weeks    Status  On-going      PT LONG TERM GOAL #2   Title  Patient tolerates prosthesis wear >90% of awake hours without skin or limb pain issues to enable function throughout his day.     Time  12    Period  Weeks    Status  On-going      PT LONG TERM GOAL #3   Title  Oceanographer with device >36/56 to indicate lower fall risk.     Time  12    Period  Weeks    Status  On-going      PT LONG TERM GOAL #4   Title  Patient ambulates 1000' outdoors including grass, ramps & curbs with cane or less and prosthesis modified independent.     Time  12    Period  Weeks    Status  On-going      PT LONG TERM GOAL #5   Title  Patient modified independent on stairs including descending alternate pattern with 2 rails and step-to pattern with 1 rail & cane engaging prosthetic hydraulics.     Time  12    Period  Weeks    Status  On-going      PT LONG TERM GOAL #6   Title  Patient demonstrates & verbalizes tasks with prosthesis to perform gardening & cooking modified independent to enable return to his hobbies.     Time  12    Period  Weeks    Status  On-going            Plan - 09/13/17 5329    Clinical Impression Statement  Today's skilled session continued to focus on gait with cane and balance with decreased UE support. Pt is progressing toward goals  and should benefit from continued PT to progress toward unmet goals.     Rehab Potential  Good    PT Frequency  2x / week    PT Duration  12 weeks    PT Treatment/Interventions  ADLs/Self Care Home Management;DME Instruction;Gait training;Stair training;Functional mobility training;Therapeutic activities;Therapeutic exercise;Balance training;Neuromuscular re-education;Canalith Repostioning;Patient/family education;Prosthetic Training;Vestibular    PT Next Visit Plan  work towards LTGs, prosthetic gait with cane.     Consulted and Agree with Plan of Care  Patient       Patient will benefit from skilled therapeutic intervention in order to improve the following deficits and impairments:  Decreased activity tolerance, Abnormal gait, Decreased balance, Decreased endurance, Decreased knowledge of use of DME, Decreased mobility, Decreased strength, Prosthetic Dependency, Postural dysfunction, Dizziness  Visit Diagnosis: Muscle weakness (generalized)  Other abnormalities of gait and mobility  Unsteadiness on feet  Abnormal posture  Problem List Patient Active Problem List   Diagnosis Date Noted  . Altered mental status   . Cerebral thrombosis with cerebral infarction 02/06/2017  . Cerebral embolism with cerebral infarction 02/06/2017  . Above knee amputation status, left ([place])   . Pressure injury of skin 01/30/2017  . Acute lower UTI 01/29/2017  . Acute metabolic encephalopathy 63/87/5643  . UTI (urinary tract infection) 01/29/2017  . Quadriceps muscle rupture, left, initial encounter   . Fall 01/09/2017  . Gait disturbance 01/09/2017  . Staphylococcus aureus infection 01/09/2017  . Infection of prosthetic left knee joint ([place]) 01/09/2017  . Fever, unknown origin 11/09/2016  . Critical lower limb ischemia 08/25/2016  . Ulcer of left midfoot with fat layer exposed ([place]) 08/13/2016  . Diabetic ulcer of left midfoot associated with type 2 diabetes mellitus, with fat layer exposed  ([place]) 07/25/2016  . Peripheral neuropathy 07/22/2016  . Tobacco abuse 07/22/2016  . CAD in native artery 05/18/2016  . CAD, multiple vessel 05/11/2016  . Positive cardiac stress test 05/11/2016  . Abnormal stress test 04/30/2016  . Pre-transplant evaluation for kidney transplant 04/30/2016  . S/P revision of total knee 11/26/2015  . Pain in the chest   . Acute on chronic diastolic heart failure ([place]) 07/05/2015  . Volume overload 07/04/2015  . Shortness of breath 07/04/2015  . Hypoxemia 07/04/2015  . Elevated troponin   . End-stage renal disease on hemodialysis ([place])   . Hypervolemia   . 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 Pyogenic bacterial arthritis of knee, left ([place]) 08/07/2014  . Tachycardia 07/24/2014  . Acute upper respiratory infection 07/24/2014  . ESRD on dialysis ([place]) 07/14/2014  . Type II diabetes mellitus ([place]) 07/14/2014  . Anemia in chronic kidney disease 07/14/2014  . Congestive heart failure (CHF) ([place]) 07/13/2014  . Surgical wound dehiscence 05/09/2014  . Dehiscence of closure of skin 05/09/2014  . Total knee replacement status 04/10/2014  . Diabetes mellitus with renal manifestations, controlled ([place]) 10/24/2013  . Hypertensive renal disease 06/27/2013  . DM type 2 causing vascular disease ([place]) 06/27/2013  . Erectile dysfunction 06/27/2013  . Depression 06/27/2013  . Claudication of left lower extremity ([place]) 12/19/2012  . Essential hypertension, benign 12/19/2012  . Sinusitis, acute maxillary 11/22/2012  . Otitis, externa, infective 11/14/2012  . Leg edema, left 11/14/2012  . End stage renal disease ([place]) 10/02/2012  . Controlled type 2 DM with proteinuria or microalbuminuria 09/19/2012  . GERD (gastroesophageal reflux disease) 09/19/2012  . Leukocytosis 09/19/2012  . Lacunar infarction ([place]) 08/17/2012  . Polymyalgia rheumatica ([place]) 08/17/2012  . 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 Essential hypertension 05/10/2012  . Vitamin D  deficiency 05/10/2012  . Diabetes mellitus due to underlying condition ([place]) 05/10/2012  . Hyperlipidemia LDL goal <100 05/10/2012  . Anemia of chronic disease 05/10/2012  . Screening for prostate cancer 05/10/2012  . Chronic kidney disease (CKD), stage IV (severe) ([place]) 05/10/2012  . Peripheral autonomic neuropathy due to DM ([place]) 05/10/2012  . Callus of foot 05/10/2012  . Urgency of urination 05/10/2012  . Hyperkalemia 05/10/2012  . Candidiasis of the esophagus 10/12/2011  . Internal hemorrhoids without mention of complication 32/95/1884  . Pre-syncope 07/25/2009  . DJD (degenerative joint disease) of cervical spine 02/17/2009    Willow Ora, PTA, [facility] [address], [place], [place] [address] [phone] 09/13/17, 10:14 AM   Name: Matthew Kline MRN: [id number] Date of Birth: 08/15/1960

## 2017-09-15 ENCOUNTER — Ambulatory Visit: Payer: Medicare HMO | Admitting: Physical Therapy

## 2017-09-20 ENCOUNTER — Encounter: Payer: Self-pay | Admitting: Physical Therapy

## 2017-09-20 ENCOUNTER — Ambulatory Visit: Payer: Medicare HMO | Admitting: Physical Therapy

## 2017-09-20 DIAGNOSIS — M6281 Muscle weakness (generalized): Secondary | ICD-10-CM

## 2017-09-20 DIAGNOSIS — R293 Abnormal posture: Secondary | ICD-10-CM

## 2017-09-20 DIAGNOSIS — R2689 Other abnormalities of gait and mobility: Secondary | ICD-10-CM

## 2017-09-20 DIAGNOSIS — R2681 Unsteadiness on feet: Secondary | ICD-10-CM

## 2017-09-20 NOTE — Therapy (Signed)
[facility] [address] [place], [place], [address] Phone: [phone]   Fax:  [phone]  Physical Therapy Treatment  Patient Details  Name: Matthew Kline MRN: [id number] Date of Birth: [birth year] Referring Provider: Meridee Score, MD   Encounter Date: 09/20/2017  PT End of Session - 09/20/17 1022    Visit Number  17    Number of Visits  25    Date for PT Re-Evaluation  09/21/17    Authorization Type  [employer]    PT Start Time  1019    PT Stop Time  1100    PT Time Calculation (min)  41 min    Equipment Utilized During Treatment  Gait belt    Activity Tolerance  Patient tolerated treatment well;Patient limited by fatigue;No increased pain    Behavior During Therapy  WFL for tasks assessed/performed       Past Medical History:  Diagnosis Date  . Anemia, unspecified   . Anxiety   . Arthralgia 2010   polyarticular  . Arthritis    "back, knees" (01/10/2017)  . Cancer [facility])    "kidney area" (01/10/2017)  . CHF (congestive heart failure) ([place]) 07/25/2009   denies  . Chronic lower back pain   . Coronary artery disease   . Coughing    pt. reports that he has drainage from sinus infection  . Diabetic foot ulcer ([place])   . Diabetic neuropathy ([place])   . ESRD (end stage renal disease) on dialysis [facility])    started 12/2012; "MWF; [place] "  (01/10/2017)  . GERD (gastroesophageal reflux disease)    hx "before I lost weight", no problem 9 years  . Hemodialysis access site with mature fistula ([place])   . Hemorrhoids, internal 10/2011   small  . High cholesterol   . History of blood transfusion    "related to the anemia"  . Hypertension   . Insomnia, unspecified   . Lacunar infarction ([place]) 2006   RUE/RLE, speech  . Long term (current) use of anticoagulants   . Myocardial infarction ([place]) 1995  . Orthostatic hypotension   . Osteomyelitis of foot, left, acute ([place])   . Other chronic  postoperative pain   . Pneumonia    "probably twice" (01/10/2017)  . Polymyalgia rheumatica ([place])   . Renal insufficiency   . Sleep apnea    "lost weight; no more problem" (01/10/2017)  . Stroke ([place]) 01/10/06   denies residual on 05/09/2014  . Type II diabetes mellitus ([place]) dx'd 1995  . Unspecified hereditary and idiopathic peripheral neuropathy    feet  . Unspecified osteomyelitis, site unspecified   . Unspecified vitamin D deficiency     Past Surgical History:  Procedure Laterality Date  . ABDOMINAL AORTOGRAM N/A 08/25/2016   Procedure: ABDOMINAL AORTOGRAM;  Surgeon: Wellington Hampshire, MD;  Location: [place] [facility];  Service: Cardiovascular;  Laterality: N/A;  . AMPUTATION  01/21/2012   Procedure: AMPUTATION RAY;  Surgeon: [name], MD;  Location: [place];  Service: Orthopedics;  Laterality: Left;  Left Foot 4th Ray Amputation  . AMPUTATION Left 05/04/2013   Procedure: AMPUTATION DIGIT;  Surgeon: [name], MD;  Location: [facility];  Service: Orthopedics;  Laterality: Left;  Left Great Toe Amputation at [facility]  . AMPUTATION Left 01/14/2017   Procedure: AMPUTATION ABOVE LEFT KNEE;  Surgeon: [name], MD;  Location: [place];  Service: Orthopedics;  Laterality: Left;  . ANTERIOR CERVICAL DECOMP/DISCECTOMY FUSION  02/2011  .  BACK SURGERY    . BASCILIC VEIN TRANSPOSITION Left 10/19/2012   Procedure: BASCILIC VEIN TRANSPOSITION;  Surgeon: Serafina Mitchell, MD;  Location: [facility];  Service: Vascular;  Laterality: Left;  . CARDIAC CATHETERIZATION     "before bypass"  . CORONARY ARTERY BYPASS GRAFT     x 5 with lima at [place] WITH ANTIBIOTIC SPACERS Left 08/07/2014   Procedure: Replace Left Total Knee Arthroplasty,  Place Antibiotic Spacer;  Surgeon: [name], MD;  Location: [facility];  Service: Orthopedics;  Laterality: Left;  . I&D EXTREMITY Left 05/09/2014   Procedure: Irrigation and Debridement Left Knee and Closure of Total Knee  Arthroplasty Incision;  Surgeon: [name], MD;  Location: [facility];  Service: Orthopedics;  Laterality: Left;  . I&D KNEE WITH POLY EXCHANGE Left 05/31/2014   Procedure: IRRIGATION AND DEBRIDEMENT LEFT KNEE, PLACE ANTIBIOTIC BEADS,  POLY EXCHANGE;  Surgeon: [name], MD;  Location: [facility];  Service: Orthopedics;  Laterality: Left;  . IRRIGATION AND DEBRIDEMENT KNEE Left 01/12/2017   Procedure: IRRIGATION AND DEBRIDEMENT LEFT KNEE;  Surgeon: [name], MD;  Location: [place];  Service: Orthopedics;  Laterality: Left;  . JOINT REPLACEMENT    . KNEE ARTHROSCOPY Left 08-25-2012  . LOWER EXTREMITY ANGIOGRAPHY Left 08/25/2016   Procedure: Lower Extremity Angiography;  Surgeon: Wellington Hampshire, MD;  Location: [place] [facility];  Service: Cardiovascular;  Laterality: Left;  . PERIPHERAL VASCULAR BALLOON ANGIOPLASTY Left 08/25/2016   Procedure: PERIPHERAL VASCULAR BALLOON ANGIOPLASTY;  Surgeon: Wellington Hampshire, MD;  Location: [place] [facility];  Service: Cardiovascular;  Laterality: Left;  lt peroneal and ant tibial arteries cutting balloon  . REFRACTIVE SURGERY Bilateral   . TOE AMPUTATION Bilateral    "I've lost 7 toes over the last 7 years" (05/09/2014)  . TOE SURGERY Left April 2015   Big toe removed on left foot.  . TONSILLECTOMY    . TOTAL KNEE ARTHROPLASTY Left 04/10/2014   Procedure: TOTAL KNEE ARTHROPLASTY;  Surgeon: [name], MD;  Location: [facility];  Service: Orthopedics;  Laterality: Left;  . TOTAL KNEE REVISION Left 10/25/2014   Procedure: LEFT TOTAL KNEE REVISION;  Surgeon: [name], MD;  Location: [facility] [place];  Service: Orthopedics;  Laterality: Left;  . TOTAL KNEE REVISION Left 11/26/2015   Procedure: Removal Left Total Knee Arthroplasty, Hinged Total Knee Arthroplasty;  Surgeon: [name], MD;  Location: [facility];  Service: Orthopedics;  Laterality: Left;  . UVULOPALATOPHARYNGOPLASTY, TONSILLECTOMY AND SEPTOPLASTY  ~ 1989  . WOUND DEBRIDEMENT Left 05/09/2014   Dehiscence  Left Total Knee Arthroplasty Incision    There were no vitals filed for this visit.  Subjective Assessment - 09/20/17 1022    Subjective  No new complaints. No falls to report.     Pertinent History  Left TFA, ESRD with dialysis M,W,F, CHF, CAD s/p CABG 05/2016, PAD, CVA, DM2, neuropathy, arthritis, right toe amputations    Limitations  Lifting;Standing;Walking;House hold activities    Patient Stated Goals  He wants to use prosthesis to walk without assistance, garden, cook / work in [employer]     Currently in Pain?  No/denies    Pain Score  0-No pain         [facility] PT Assessment - 09/20/17 1024      Berg Balance Test   Sit to Stand  Able to stand  independently using hands    Standing Unsupported  Able to  stand safely 2 minutes    Sitting with Back Unsupported but Feet Supported on Floor or Stool  Able to sit safely and securely 2 minutes    Stand to Sit  Sits safely with minimal use of hands    Transfers  Able to transfer with verbal cueing and /or supervision    Standing Unsupported with Eyes Closed  Able to stand 10 seconds with supervision    Standing Ubsupported with Feet Together  Able to place feet together independently and stand for 1 minute with supervision    From Standing, Reach Forward with Outstretched Arm  Can reach confidently >25 cm (10")    From Standing Position, Pick up Object from [place] to pick up shoe, needs supervision    From Standing Position, Turn to Look Behind Over each Shoulder  Looks behind one side only/other side shows less weight shift   left > right   Turn 360 Degrees  Needs assistance while turning   loss of balance toward end of turn needing    Standing Unsupported, Alternately Place Feet on Step/Stool  Able to complete >2 steps/needs minimal assist   used forearm crutch for balance assist   Standing Unsupported, One Foot in Front  Able to take small step independently and hold 30 seconds    Standing on One Leg  Tries to lift leg/unable to  hold 3 seconds but remains standing independently    Total Score  37    Berg comment:  37/56= significant risk for falls           [facility] Adult PT Treatment/Exercise - 09/20/17 1024      Transfers   Transfers  Sit to Stand;Stand to Sit    Sit to Stand  5: Supervision;With upper extremity assist;With armrests;From chair/3-in-1    Stand to Sit  5: Supervision;With upper extremity assist;With armrests;To chair/3-in-1      Ambulation/Gait   Ambulation/Gait  Yes    Ambulation/Gait Assistance  5: Supervision;6: Modified independent (Device/Increase time)    Ambulation/Gait Assistance Details  occasional foot scuffing noted. no signifant balance loss.     Ambulation Distance (Feet)  1000 Feet    Assistive device  Lofstrands;Prosthesis    Gait Pattern  Decreased step length - right;Decreased stance time - left;Decreased stride length;Decreased hip/knee flexion - left;Decreased weight shift to left;Left circumduction;Left hip hike;Antalgic;Lateral hip instability;Trunk flexed;Decreased trunk rotation;Abducted - left;Poor foot clearance - left;Step-through pattern    Ambulation Surface  Level;Indoor    Ramp  6: Modified independent (Device)    Ramp Details (indicate cue type and reason)  outdoor ramps with forarm crutches      Prosthetics   Current prosthetic wear tolerance (days/week)   daily    Current prosthetic wear tolerance (#hours/day)   reports all awake hours with a break somedays after dialysis for napping    Residual limb condition   no issues per pt report    Donning Prosthesis  Supervision    Doffing Prosthesis  Modified independent (device/increased time)               PT Short Term Goals - 08/25/17 1025      PT SHORT TERM GOAL #1   Title  Patient verbalizes adjusting ply socks for volume changes with minimal PT guidance. (This goal due 08/26/17)    Baseline  MET 08/23/2017    Status  Achieved      PT SHORT TERM GOAL #2   Title  Patient ambulates 400' with  crutches engaging prosthesis with supervision / verbal cues.     Baseline  MET 08/25/2017    Status  Achieved      PT SHORT TERM GOAL #3   Title  Patient negotiates ramps, curbs with crutches & stairs with 1 rail/1 crutch with supervision.     Baseline  MET 08/25/2017    Status  Achieved      PT SHORT TERM GOAL #4   Title  Patient stands 2 minutes without UE support, reaches 5" and to floor with supervision.     Baseline  MET 08/23/2017    Status  Achieved        PT Long Term Goals - 09/20/17 1022      PT LONG TERM GOAL #1   Title  Patient demonstrates & verbalizes proper prosthetic care including dialysis issues to enable safe use of prosthesis. (All LTGs Target Date: 09/21/2017)    Baseline  09/20/17: met today    Time  --    Period  --    Status  Achieved      PT LONG TERM GOAL #2   Title  Patient tolerates prosthesis wear >90% of awake hours without skin or limb pain issues to enable function throughout his day.     Baseline  09/20/17: met today    Time  --    Period  --    Status  Achieved      PT LONG TERM GOAL #3   Title  Berg Balance with device >36/56 to indicate lower fall risk.     Baseline  09/20/17: 37/56 scored today    Time  --    Period  --    Status  Achieved      PT LONG TERM GOAL #4   Title  Patient ambulates 1000' outdoors including grass, ramps & curbs with cane or less and prosthesis modified independent.     Baseline  09/20/17: met with bil forearm crutches/prosthesis today and with previous sessions. not met at cane level    Time  --    Period  --    Status  Partially Met      PT LONG TERM GOAL #5   Title  Patient modified independent on stairs including descending alternate pattern with 2 rails and step-to pattern with 1 rail & cane engaging prosthetic hydraulics.     Baseline  09/20/17: does not fully engage prosthetic hydraulics    Time  --    Period  --    Status  Partially Met      PT LONG TERM GOAL #6   Title  Patient demonstrates &  verbalizes tasks with prosthesis to perform gardening & cooking modified independent to enable return to his hobbies. 09/20/17: pt reports doing some of these, not back to them fully.     Time  --    Period  --    Status  Partially Met            Plan - 09/20/17 1022    Clinical Impression Statement  Today's skilled session focused on progress toward LTGs with intention for primary PT to recert. Pt has met or partially met LTGs. Should benefit from continued PT to progress toward goals of recert.     Rehab Potential  Good    PT Frequency  2x / week    PT Duration  12 weeks    PT Treatment/Interventions  ADLs/Self Care Home Management;DME Instruction;Gait training;Stair training;Functional mobility training;Therapeutic activities;Therapeutic exercise;Balance training;Neuromuscular  re-education;Canalith Repostioning;Patient/family education;Prosthetic Training;Vestibular    PT Next Visit Plan  prosthetic gait with cane, balance activities working toward updated STGs. Primary PT to complete recert.     Consulted and Agree with Plan of Care  Patient       Patient will benefit from skilled therapeutic intervention in order to improve the following deficits and impairments:  Decreased activity tolerance, Abnormal gait, Decreased balance, Decreased endurance, Decreased knowledge of use of DME, Decreased mobility, Decreased strength, Prosthetic Dependency, Postural dysfunction, Dizziness  Visit Diagnosis: Muscle weakness (generalized)  Other abnormalities of gait and mobility  Unsteadiness on feet  Abnormal posture     Problem List Patient Active Problem List   Diagnosis Date Noted  . Altered mental status   . Cerebral thrombosis with cerebral infarction 02/06/2017  . Cerebral embolism with cerebral infarction 02/06/2017  . Above knee amputation status, left ([place])   . Pressure injury of skin 01/30/2017  . Acute lower UTI 01/29/2017  . Acute metabolic encephalopathy 31/54/0086  .  UTI (urinary tract infection) 01/29/2017  . Quadriceps muscle rupture, left, initial encounter   . Fall 01/09/2017  . Gait disturbance 01/09/2017  . Staphylococcus aureus infection 01/09/2017  . Infection of prosthetic left knee joint ([place]) 01/09/2017  . Fever, unknown origin 11/09/2016  . Critical lower limb ischemia 08/25/2016  . Ulcer of left midfoot with fat layer exposed ([place]) 08/13/2016  . Diabetic ulcer of left midfoot associated with type 2 diabetes mellitus, with fat layer exposed ([place]) 07/25/2016  . Peripheral neuropathy 07/22/2016  . Tobacco abuse 07/22/2016  . CAD in native artery 05/18/2016  . CAD, multiple vessel 05/11/2016  . Positive cardiac stress test 05/11/2016  . Abnormal stress test 04/30/2016  . Pre-transplant evaluation for kidney transplant 04/30/2016  . S/P revision of total knee 11/26/2015  . Pain in the chest   . Acute on chronic diastolic heart failure ([place]) 07/05/2015  . Volume overload 07/04/2015  . Shortness of breath 07/04/2015  . Hypoxemia 07/04/2015  . Elevated troponin   . End-stage renal disease on hemodialysis ([place])   . Hypervolemia   . 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 Pyogenic bacterial arthritis of knee, left ([place]) 08/07/2014  . Tachycardia 07/24/2014  . Acute upper respiratory infection 07/24/2014  . ESRD on dialysis ([place]) 07/14/2014  . Type II diabetes mellitus ([place]) 07/14/2014  . Anemia in chronic kidney disease 07/14/2014  . Congestive heart failure (CHF) ([place]) 07/13/2014  . Surgical wound dehiscence 05/09/2014  . Dehiscence of closure of skin 05/09/2014  . Total knee replacement status 04/10/2014  . Diabetes mellitus with renal manifestations, controlled ([place]) 10/24/2013  . Hypertensive renal disease 06/27/2013  . DM type 2 causing vascular disease ([place]) 06/27/2013  . Erectile dysfunction 06/27/2013  . Depression 06/27/2013  . Claudication of left lower extremity ([place]) 12/19/2012  . Essential hypertension,  benign 12/19/2012  . Sinusitis, acute maxillary 11/22/2012  . Otitis, externa, infective 11/14/2012  . Leg edema, left 11/14/2012  . End stage renal disease ([place]) 10/02/2012  . Controlled type 2 DM with proteinuria or microalbuminuria 09/19/2012  . GERD (gastroesophageal reflux disease) 09/19/2012  . Leukocytosis 09/19/2012  . Lacunar infarction ([place]) 08/17/2012  . Polymyalgia rheumatica ([place]) 08/17/2012  . 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 Essential hypertension 05/10/2012  . Vitamin D deficiency 05/10/2012  . Diabetes mellitus due to underlying condition ([place]) 05/10/2012  . Hyperlipidemia LDL goal <100 05/10/2012  . Anemia of chronic disease 05/10/2012  . Screening for prostate cancer 05/10/2012  .  Chronic kidney disease (CKD), stage IV (severe) ([place]) 05/10/2012  . Peripheral autonomic neuropathy due to DM ([place]) 05/10/2012  . Callus of foot 05/10/2012  . Urgency of urination 05/10/2012  . Hyperkalemia 05/10/2012  . Candidiasis of the esophagus 10/12/2011  . Internal hemorrhoids without mention of complication 04/49/2524  . Pre-syncope 07/25/2009  . DJD (degenerative joint disease) of cervical spine 02/17/2009    Willow Ora, PTA, [facility] Neuro [facility] [address], [place], [place] [address] [phone] 09/20/17, 2:19 PM    Name: Carlosdaniel Grob MRN: [id number] Date of Birth: 02/03/1960

## 2017-09-22 ENCOUNTER — Encounter: Payer: Self-pay | Admitting: Physical Therapy

## 2017-09-22 ENCOUNTER — Ambulatory Visit: Payer: Medicare HMO | Admitting: Physical Therapy

## 2017-09-22 DIAGNOSIS — R2689 Other abnormalities of gait and mobility: Secondary | ICD-10-CM

## 2017-09-22 DIAGNOSIS — R293 Abnormal posture: Secondary | ICD-10-CM

## 2017-09-22 DIAGNOSIS — R2681 Unsteadiness on feet: Secondary | ICD-10-CM

## 2017-09-22 DIAGNOSIS — M6281 Muscle weakness (generalized): Secondary | ICD-10-CM | POA: Diagnosis not present

## 2017-09-22 DIAGNOSIS — Z9181 History of falling: Secondary | ICD-10-CM

## 2017-09-23 NOTE — Therapy (Signed)
[facility] [address] [place], [place], [address] Phone: [phone]   Fax:  [phone]  Physical Therapy Treatment  Patient Details  Name: Matthew Kline MRN: [id number] Date of Birth: [birth year] Referring Provider: Meridee Score, MD   Encounter Date: 09/22/2017  PT End of Session - 09/22/17 0942    Visit Number  18    Number of Visits  25    Date for PT Re-Evaluation  09/21/17    Authorization Type  [employer]    PT Start Time  [phone]   pt late for appt today   PT Stop Time  1015    PT Time Calculation (min)  36 min    Equipment Utilized During Treatment  Gait belt    Activity Tolerance  Patient tolerated treatment well;Patient limited by fatigue;No increased pain    Behavior During Therapy  WFL for tasks assessed/performed       Past Medical History:  Diagnosis Date  . Anemia, unspecified   . Anxiety   . Arthralgia 2010   polyarticular  . Arthritis    "back, knees" (01/10/2017)  . Cancer [facility])    "kidney area" (01/10/2017)  . CHF (congestive heart failure) ([place]) 07/25/2009   denies  . Chronic lower back pain   . Coronary artery disease   . Coughing    pt. reports that he has drainage from sinus infection  . Diabetic foot ulcer ([place])   . Diabetic neuropathy ([place])   . ESRD (end stage renal disease) on dialysis [facility])    started 12/2012; "MWF; [place] [facility] "  (01/10/2017)  . GERD (gastroesophageal reflux disease)    hx "before I lost weight", no problem 9 years  . Hemodialysis access site with mature fistula ([place])   . Hemorrhoids, internal 10/2011   small  . High cholesterol   . History of blood transfusion    "related to the anemia"  . Hypertension   . Insomnia, unspecified   . Lacunar infarction ([place]) 2006   RUE/RLE, speech  . Long term (current) use of anticoagulants   . Myocardial infarction ([place]) 1995  . Orthostatic hypotension   . Osteomyelitis of foot, left, acute ([place])   .  Other chronic postoperative pain   . Pneumonia    "probably twice" (01/10/2017)  . Polymyalgia rheumatica ([place])   . Renal insufficiency   . Sleep apnea    "lost weight; no more problem" (01/10/2017)  . Stroke ([place]) 01/10/06   denies residual on 05/09/2014  . Type II diabetes mellitus ([place]) dx'd 1995  . Unspecified hereditary and idiopathic peripheral neuropathy    feet  . Unspecified osteomyelitis, site unspecified   . Unspecified vitamin D deficiency     Past Surgical History:  Procedure Laterality Date  . ABDOMINAL AORTOGRAM N/A 08/25/2016   Procedure: ABDOMINAL AORTOGRAM;  Surgeon: Wellington [place], MD;  Location: [place] [facility];  Service: Cardiovascular;  Laterality: N/A;  . AMPUTATION  01/21/2012   Procedure: AMPUTATION RAY;  Surgeon: [name], MD;  Location: [place];  Service: Orthopedics;  Laterality: Left;  Left Foot 4th Ray Amputation  . AMPUTATION Left 05/04/2013   Procedure: AMPUTATION DIGIT;  Surgeon: [name], MD;  Location: [facility];  Service: Orthopedics;  Laterality: Left;  Left Great Toe Amputation at [facility]  . AMPUTATION Left 01/14/2017   Procedure: AMPUTATION ABOVE LEFT KNEE;  Surgeon: [name], MD;  Location: [place];  Service: Orthopedics;  Laterality: Left;  .  ANTERIOR CERVICAL DECOMP/DISCECTOMY FUSION  02/2011  . BACK SURGERY    . BASCILIC VEIN TRANSPOSITION Left 10/19/2012   Procedure: BASCILIC VEIN TRANSPOSITION;  Surgeon: Serafina [place], MD;  Location: [place];  Service: Vascular;  Laterality: Left;  . CARDIAC CATHETERIZATION     "before bypass"  . CORONARY ARTERY BYPASS GRAFT     x 5 with lima at [place] WITH ANTIBIOTIC SPACERS Left 08/07/2014   Procedure: Replace Left Total Knee Arthroplasty,  Place Antibiotic Spacer;  Surgeon: [name], MD;  Location: [place];  Service: Orthopedics;  Laterality: Left;  . I&D EXTREMITY Left 05/09/2014   Procedure: Irrigation and Debridement Left Knee and Closure of Total  Knee Arthroplasty Incision;  Surgeon: [name], MD;  Location: [place];  Service: Orthopedics;  Laterality: Left;  . I&D KNEE WITH POLY EXCHANGE Left 05/31/2014   Procedure: IRRIGATION AND DEBRIDEMENT LEFT KNEE, PLACE ANTIBIOTIC BEADS,  POLY EXCHANGE;  Surgeon: [name], MD;  Location: [place];  Service: Orthopedics;  Laterality: Left;  . IRRIGATION AND DEBRIDEMENT KNEE Left 01/12/2017   Procedure: IRRIGATION AND DEBRIDEMENT LEFT KNEE;  Surgeon: [name], MD;  Location: [place];  Service: Orthopedics;  Laterality: Left;  . JOINT REPLACEMENT    . KNEE ARTHROSCOPY Left 08-25-2012  . LOWER EXTREMITY ANGIOGRAPHY Left 08/25/2016   Procedure: Lower Extremity Angiography;  Surgeon: Wellington [place], MD;  Location: [place] [facility];  Service: Cardiovascular;  Laterality: Left;  . PERIPHERAL VASCULAR BALLOON ANGIOPLASTY Left 08/25/2016   Procedure: PERIPHERAL VASCULAR BALLOON ANGIOPLASTY;  Surgeon: Wellington [place], MD;  Location: [place] [facility];  Service: Cardiovascular;  Laterality: Left;  lt peroneal and ant tibial arteries cutting balloon  . REFRACTIVE SURGERY Bilateral   . TOE AMPUTATION Bilateral    "I've lost 7 toes over the last 7 years" (05/09/2014)  . TOE SURGERY Left April 2015   Big toe removed on left foot.  . TONSILLECTOMY    . TOTAL KNEE ARTHROPLASTY Left 04/10/2014   Procedure: TOTAL KNEE ARTHROPLASTY;  Surgeon: [name], MD;  Location: [place];  Service: Orthopedics;  Laterality: Left;  . TOTAL KNEE REVISION Left 10/25/2014   Procedure: LEFT TOTAL KNEE REVISION;  Surgeon: [name], MD;  Location: [place];  Service: Orthopedics;  Laterality: Left;  . TOTAL KNEE REVISION Left 11/26/2015   Procedure: Removal Left Total Knee Arthroplasty, Hinged Total Knee Arthroplasty;  Surgeon: [name], MD;  Location: [place];  Service: Orthopedics;  Laterality: Left;  . UVULOPALATOPHARYNGOPLASTY, TONSILLECTOMY AND SEPTOPLASTY  ~ 1989  . WOUND DEBRIDEMENT Left 05/09/2014    Dehiscence Left Total Knee Arthroplasty Incision    There were no vitals filed for this visit.  Subjective Assessment - 09/22/17 0941    Subjective  No falls to report. Does report he has had an upset stomach all night last night and this am.     Pertinent History  Left TFA, ESRD with dialysis M,W,F, CHF, CAD s/p CABG 05/2016, PAD, CVA, DM2, neuropathy, arthritis, right toe amputations    Limitations  Lifting;Standing;Walking;House hold activities    Patient Stated Goals  He wants to use prosthesis to walk without assistance, garden, cook / work in [employer]     Currently in Pain?  No/denies    Pain Score  0-No pain            [facility] Adult PT Treatment/Exercise - 09/22/17 1610  Transfers   Transfers  Sit to Stand;Stand to Sit    Sit to Stand  5: Supervision;With upper extremity assist;With armrests;From chair/3-in-1    Stand to Sit  5: Supervision;With upper extremity assist;With armrests;To chair/3-in-1      Ambulation/Gait   Ambulation/Gait  Yes    Ambulation/Gait Assistance  5: Supervision;4: Min assist;3: Mod assist    Ambulation/Gait Assistance Details  gait with straight cane around gym with activities. pt needs min assist with HHA at times. cues to decrease lateral lean over cane for improved balance. Noted improved use of knee micor processor after activities in parallel bars.     Ambulation Distance (Feet)  40 Feet   x2, 90 x1, plus around gym with activities   Assistive device  Lofstrands;Prosthesis;Straight cane    Gait Pattern  Decreased step length - right;Decreased stance time - left;Decreased stride length;Decreased hip/knee flexion - left;Decreased weight shift to left;Left circumduction;Left hip hike;Antalgic;Lateral hip instability;Trunk flexed;Decreased trunk rotation;Abducted - left;Poor foot clearance - left;Step-through pattern    Ambulation Surface  Level;Indoor      High Level Balance   High Level Balance Activities  Side stepping;Backward walking    High  Level Balance Comments  with straight cane with rubber quad tip next to counter: 3-4 laps each with up to min assist for balance. cues on technique, sequencing and weight shiting with each balance activity.        Prosthetics   Current prosthetic wear tolerance (days/week)   daily    Current prosthetic wear tolerance (#hours/day)   reports all awake hours with a break somedays after dialysis for napping    Residual limb condition   no issues per pt report    Donning Prosthesis  Supervision    Doffing Prosthesis  Supervision          Balance Exercises - 09/22/17 1002      Balance Exercises: Standing   Rockerboard  Anterior/posterior;Lateral;EO;EC;30 seconds;10 reps    Other Standing Exercises  4 inch box with inclined boxes on each side: bloc practice stepping over set up with prosthesis going down the declined blocks with emphasis on engagement of micro processor. bil UE support on bars with cues on weight shifting and hip movement to fully engage prosthesis. this improved with repetition and practice.                       Balance Exercises: Standing   Rebounder Limitations  performed both ways on balance board: holding it steady with arms at sides with EO, progressing to alternating UE raises, progressing to bil UE raises, progressing to arms at sides with EC. min guard to min assist for balance with cues on posture, weight shifting for balance assitance.           PT Short Term Goals - 08/25/17 1025      PT SHORT TERM GOAL #1   Title  Patient verbalizes adjusting ply socks for volume changes with minimal PT guidance. (This goal due 08/26/17)    Baseline  MET 08/23/2017    Status  Achieved      PT SHORT TERM GOAL #2   Title  Patient ambulates 400' with crutches engaging prosthesis with supervision / verbal cues.     Baseline  MET 08/25/2017    Status  Achieved      PT SHORT TERM GOAL #3   Title  Patient negotiates ramps, curbs with crutches & stairs with 1 rail/1 crutch with  supervision.  Baseline  MET 08/25/2017    Status  Achieved      PT SHORT TERM GOAL #4   Title  Patient stands 2 minutes without UE support, reaches 5" and to floor with supervision.     Baseline  MET 08/23/2017    Status  Achieved        PT Long Term Goals - 09/20/17 1022      PT LONG TERM GOAL #1   Title  Patient demonstrates & verbalizes proper prosthetic care including dialysis issues to enable safe use of prosthesis. (All LTGs Target Date: 09/21/2017)    Baseline  09/20/17: met today    Time  --    Period  --    Status  Achieved      PT LONG TERM GOAL #2   Title  Patient tolerates prosthesis wear >90% of awake hours without skin or limb pain issues to enable function throughout his day.     Baseline  09/20/17: met today    Time  --    Period  --    Status  Achieved      PT LONG TERM GOAL #3   Title  Berg Balance with device >36/56 to indicate lower fall risk.     Baseline  09/20/17: 37/56 scored today    Time  --    Period  --    Status  Achieved      PT LONG TERM GOAL #4   Title  Patient ambulates 1000' outdoors including grass, ramps & curbs with cane or less and prosthesis modified independent.     Baseline  09/20/17: met with bil forearm crutches/prosthesis today and with previous sessions. not met at cane level    Time  --    Period  --    Status  Partially Met      PT LONG TERM GOAL #5   Title  Patient modified independent on stairs including descending alternate pattern with 2 rails and step-to pattern with 1 rail & cane engaging prosthetic hydraulics.     Baseline  09/20/17: does not fully engage prosthetic hydraulics    Time  --    Period  --    Status  Partially Met      PT LONG TERM GOAL #6   Title  Patient demonstrates & verbalizes tasks with prosthesis to perform gardening & cooking modified independent to enable return to his hobbies. 09/20/17: pt reports doing some of these, not back to them fully.     Time  --    Period  --    Status  Partially Met             Plan - 09/22/17 0942    Clinical Impression Statement  Today's skilled session continued to focus on use of microprocessor prosthesis with gait with cane and balance activities. Pt continues to need cues for weight shifting needed to fully engage prosthetic knee. He is progressing with skilled sessions and should benefit from continued PT to progress toward unmet goals.     Rehab Potential  Good    PT Frequency  2x / week    PT Duration  12 weeks    PT Treatment/Interventions  ADLs/Self Care Home Management;DME Instruction;Gait training;Stair training;Functional mobility training;Therapeutic activities;Therapeutic exercise;Balance training;Neuromuscular re-education;Canalith Repostioning;Patient/family education;Prosthetic Training;Vestibular    PT Next Visit Plan  prosthetic gait with cane, balance activities working toward updated STGs. Primary PT to complete recert.     Consulted and Agree with Plan of Care  Patient       Patient will benefit from skilled therapeutic intervention in order to improve the following deficits and impairments:  Decreased activity tolerance, Abnormal gait, Decreased balance, Decreased endurance, Decreased knowledge of use of DME, Decreased mobility, Decreased strength, Prosthetic Dependency, Postural dysfunction, Dizziness  Visit Diagnosis: Muscle weakness (generalized)  Other abnormalities of gait and mobility  Unsteadiness on feet  Abnormal posture  History of falling     Problem List Patient Active Problem List   Diagnosis Date Noted  . Altered mental status   . Cerebral thrombosis with cerebral infarction 02/06/2017  . Cerebral embolism with cerebral infarction 02/06/2017  . Above knee amputation status, left ([place])   . Pressure injury of skin 01/30/2017  . Acute lower UTI 01/29/2017  . Acute metabolic encephalopathy 70/96/2836  . UTI (urinary tract infection) 01/29/2017  . Quadriceps muscle rupture, left, initial encounter    . Fall 01/09/2017  . Gait disturbance 01/09/2017  . Staphylococcus aureus infection 01/09/2017  . Infection of prosthetic left knee joint ([place]) 01/09/2017  . Fever, unknown origin 11/09/2016  . Critical lower limb ischemia 08/25/2016  . Ulcer of left midfoot with fat layer exposed ([place]) 08/13/2016  . Diabetic ulcer of left midfoot associated with type 2 diabetes mellitus, with fat layer exposed ([place]) 07/25/2016  . Peripheral neuropathy 07/22/2016  . Tobacco abuse 07/22/2016  . CAD in native artery 05/18/2016  . CAD, multiple vessel 05/11/2016  . Positive cardiac stress test 05/11/2016  . Abnormal stress test 04/30/2016  . Pre-transplant evaluation for kidney transplant 04/30/2016  . S/P revision of total knee 11/26/2015  . Pain in the chest   . Acute on chronic diastolic heart failure ([place]) 07/05/2015  . Volume overload 07/04/2015  . Shortness of breath 07/04/2015  . Hypoxemia 07/04/2015  . Elevated troponin   . End-stage renal disease on hemodialysis ([place])   . Hypervolemia   . 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 Pyogenic bacterial arthritis of knee, left ([place]) 08/07/2014  . Tachycardia 07/24/2014  . Acute upper respiratory infection 07/24/2014  . ESRD on dialysis ([place]) 07/14/2014  . Type II diabetes mellitus ([place]) 07/14/2014  . Anemia in chronic kidney disease 07/14/2014  . Congestive heart failure (CHF) ([place]) 07/13/2014  . Surgical wound dehiscence 05/09/2014  . Dehiscence of closure of skin 05/09/2014  . Total knee replacement status 04/10/2014  . Diabetes mellitus with renal manifestations, controlled ([place]) 10/24/2013  . Hypertensive renal disease 06/27/2013  . DM type 2 causing vascular disease ([place]) 06/27/2013  . Erectile dysfunction 06/27/2013  . Depression 06/27/2013  . Claudication of left lower extremity ([place]) 12/19/2012  . Essential hypertension, benign 12/19/2012  . Sinusitis, acute maxillary 11/22/2012  . Otitis, externa, infective  11/14/2012  . Leg edema, left 11/14/2012  . End stage renal disease ([place]) 10/02/2012  . Controlled type 2 DM with proteinuria or microalbuminuria 09/19/2012  . GERD (gastroesophageal reflux disease) 09/19/2012  . Leukocytosis 09/19/2012  . Lacunar infarction ([place]) 08/17/2012  . Polymyalgia rheumatica ([place]) 08/17/2012  . 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 Essential hypertension 05/10/2012  . Vitamin D deficiency 05/10/2012  . Diabetes mellitus due to underlying condition ([place]) 05/10/2012  . Hyperlipidemia LDL goal <100 05/10/2012  . Anemia of chronic disease 05/10/2012  . Screening for prostate cancer 05/10/2012  . Chronic kidney disease (CKD), stage IV (severe) ([place]) 05/10/2012  . Peripheral autonomic neuropathy due to DM ([place]) 05/10/2012  . Callus of foot 05/10/2012  . Urgency of urination 05/10/2012  .  Hyperkalemia 05/10/2012  . Candidiasis of the esophagus 10/12/2011  . Internal hemorrhoids without mention of complication 36/43/8377  . Pre-syncope 07/25/2009  . DJD (degenerative joint disease) of cervical spine 02/17/2009    Willow Ora, PTA, [facility] Outpatient Neuro [facility] [address] [place], [place] [address] [phone] 09/23/17, 10:57 AM   Name: Matthew Kline MRN: [id number] Date of Birth: 10-22-1960

## 2017-09-27 ENCOUNTER — Encounter: Payer: Self-pay | Admitting: Physical Therapy

## 2017-09-27 ENCOUNTER — Ambulatory Visit: Payer: Medicare HMO | Admitting: Physical Therapy

## 2017-09-27 DIAGNOSIS — M6281 Muscle weakness (generalized): Secondary | ICD-10-CM

## 2017-09-27 DIAGNOSIS — R2689 Other abnormalities of gait and mobility: Secondary | ICD-10-CM

## 2017-09-27 DIAGNOSIS — R293 Abnormal posture: Secondary | ICD-10-CM

## 2017-09-27 DIAGNOSIS — R2681 Unsteadiness on feet: Secondary | ICD-10-CM

## 2017-09-28 NOTE — Therapy (Signed)
[facility] [address] [place], [place], [address] Phone: [phone]   Fax:  [phone]  Physical Therapy Treatment  Patient Details  Name: Matthew Kline MRN: [id number] Date of Birth: [birth year] Referring Provider: Meridee Score, MD   Encounter Date: 09/27/2017  PT End of Session - 09/27/17 0850    Visit Number  19    Number of Visits  25    Date for PT Re-Evaluation  09/21/17    Authorization Type  [employer]    PT Start Time  [phone]    PT Stop Time  0930    PT Time Calculation (min)  41 min    Equipment Utilized During Treatment  Gait belt    Activity Tolerance  Patient tolerated treatment well    Behavior During Therapy  [facility] for tasks assessed/performed       Past Medical History:  Diagnosis Date  . Anemia, unspecified   . Anxiety   . Arthralgia 2010   polyarticular  . Arthritis    "back, knees" (01/10/2017)  . Cancer [facility])    "kidney area" (01/10/2017)  . CHF (congestive heart failure) ([place]) 07/25/2009   denies  . Chronic lower back pain   . Coronary artery disease   . Coughing    pt. reports that he has drainage from sinus infection  . Diabetic foot ulcer ([place])   . Diabetic neuropathy ([place])   . ESRD (end stage renal disease) on dialysis [facility])    started 12/2012; "MWF; [place] "  (01/10/2017)  . GERD (gastroesophageal reflux disease)    hx "before I lost weight", no problem 9 years  . Hemodialysis access site with mature fistula ([place])   . Hemorrhoids, internal 10/2011   small  . High cholesterol   . History of blood transfusion    "related to the anemia"  . Hypertension   . Insomnia, unspecified   . Lacunar infarction ([place]) 2006   RUE/RLE, speech  . Long term (current) use of anticoagulants   . Myocardial infarction ([place]) 1995  . Orthostatic hypotension   . Osteomyelitis of foot, left, acute ([place])   . Other chronic postoperative pain   . Pneumonia    "probably twice"  (01/10/2017)  . Polymyalgia rheumatica ([place])   . Renal insufficiency   . Sleep apnea    "lost weight; no more problem" (01/10/2017)  . Stroke ([place]) 01/10/06   denies residual on 05/09/2014  . Type II diabetes mellitus ([place]) dx'd 1995  . Unspecified hereditary and idiopathic peripheral neuropathy    feet  . Unspecified osteomyelitis, site unspecified   . Unspecified vitamin D deficiency     Past Surgical History:  Procedure Laterality Date  . ABDOMINAL AORTOGRAM N/A 08/25/2016   Procedure: ABDOMINAL AORTOGRAM;  Surgeon: Wellington Hampshire, MD;  Location: [place] [facility];  Service: Cardiovascular;  Laterality: N/A;  . AMPUTATION  01/21/2012   Procedure: AMPUTATION RAY;  Surgeon: [name], MD;  Location: [place];  Service: Orthopedics;  Laterality: Left;  Left Foot 4th Ray Amputation  . AMPUTATION Left 05/04/2013   Procedure: AMPUTATION DIGIT;  Surgeon: [name], MD;  Location: [place];  Service: Orthopedics;  Laterality: Left;  Left Great Toe Amputation at [facility]  . AMPUTATION Left 01/14/2017   Procedure: AMPUTATION ABOVE LEFT KNEE;  Surgeon: [name], MD;  Location: [place];  Service: Orthopedics;  Laterality: Left;  . ANTERIOR CERVICAL DECOMP/DISCECTOMY FUSION  02/2011  . BACK SURGERY    .  BASCILIC VEIN TRANSPOSITION Left 10/19/2012   Procedure: BASCILIC VEIN TRANSPOSITION;  Surgeon: Serafina Mitchell, MD;  Location: [place];  Service: Vascular;  Laterality: Left;  . CARDIAC CATHETERIZATION     "before bypass"  . CORONARY ARTERY BYPASS GRAFT     x 5 with lima at [place] WITH ANTIBIOTIC SPACERS Left 08/07/2014   Procedure: Replace Left Total Knee Arthroplasty,  Place Antibiotic Spacer;  Surgeon: [name], MD;  Location: [place];  Service: Orthopedics;  Laterality: Left;  . I&D EXTREMITY Left 05/09/2014   Procedure: Irrigation and Debridement Left Knee and Closure of Total Knee Arthroplasty Incision;  Surgeon: [name], MD;  Location: [place];  Service: Orthopedics;  Laterality: Left;  . I&D KNEE WITH POLY EXCHANGE Left 05/31/2014   Procedure: IRRIGATION AND DEBRIDEMENT LEFT KNEE, PLACE ANTIBIOTIC BEADS,  POLY EXCHANGE;  Surgeon: [name], MD;  Location: [place];  Service: Orthopedics;  Laterality: Left;  . IRRIGATION AND DEBRIDEMENT KNEE Left 01/12/2017   Procedure: IRRIGATION AND DEBRIDEMENT LEFT KNEE;  Surgeon: [name], MD;  Location: [place];  Service: Orthopedics;  Laterality: Left;  . JOINT REPLACEMENT    . KNEE ARTHROSCOPY Left 08-25-2012  . LOWER EXTREMITY ANGIOGRAPHY Left 08/25/2016   Procedure: Lower Extremity Angiography;  Surgeon: Wellington Hampshire, MD;  Location: [place] [facility];  Service: Cardiovascular;  Laterality: Left;  . PERIPHERAL VASCULAR BALLOON ANGIOPLASTY Left 08/25/2016   Procedure: PERIPHERAL VASCULAR BALLOON ANGIOPLASTY;  Surgeon: Wellington Hampshire, MD;  Location: [place] [facility];  Service: Cardiovascular;  Laterality: Left;  lt peroneal and ant tibial arteries cutting balloon  . REFRACTIVE SURGERY Bilateral   . TOE AMPUTATION Bilateral    "I've lost 7 toes over the last 7 years" (05/09/2014)  . TOE SURGERY Left April 2015   Big toe removed on left foot.  . TONSILLECTOMY    . TOTAL KNEE ARTHROPLASTY Left 04/10/2014   Procedure: TOTAL KNEE ARTHROPLASTY;  Surgeon: [name], MD;  Location: [place];  Service: Orthopedics;  Laterality: Left;  . TOTAL KNEE REVISION Left 10/25/2014   Procedure: LEFT TOTAL KNEE REVISION;  Surgeon: [name], MD;  Location: [place];  Service: Orthopedics;  Laterality: Left;  . TOTAL KNEE REVISION Left 11/26/2015   Procedure: Removal Left Total Knee Arthroplasty, Hinged Total Knee Arthroplasty;  Surgeon: [name], MD;  Location: [place];  Service: Orthopedics;  Laterality: Left;  . UVULOPALATOPHARYNGOPLASTY, TONSILLECTOMY AND SEPTOPLASTY  ~ 1989  . WOUND DEBRIDEMENT Left 05/09/2014   Dehiscence Left Total Knee Arthroplasty Incision    There were no vitals  filed for this visit.  Subjective Assessment - 09/27/17 0849    Subjective  No new complaints. No falls or pain to report. Passed out yesterday at dialysis, goes back today as they had to stop.     Pertinent History  Left TFA, ESRD with dialysis M,W,F, CHF, CAD s/p CABG 05/2016, PAD, CVA, DM2, neuropathy, arthritis, right toe amputations    Limitations  Lifting;Standing;Walking;House hold activities    Currently in Pain?  No/denies    Pain Score  0-No pain            [facility] Adult PT Treatment/Exercise - 09/27/17 0851      Transfers   Transfers  Sit to Stand;Stand to Sit    Sit to Stand  5: Supervision;With upper extremity assist;With armrests;From chair/3-in-1    Stand to Sit  5: Supervision;With upper  extremity assist;With armrests;To chair/3-in-1      Ambulation/Gait   Ambulation/Gait  Yes    Ambulation/Gait Assistance  6: Modified independent (Device/Increase time);4: Min guard;4: Min assist    Ambulation/Gait Assistance Details  pt is mod I with crutches. used straight cane with rubber quad tip in session to ambulate between activities with min assist for balance, cues on posture, step length, weight shifting and cane placement.     Ambulation Distance (Feet)  --   around gym with cane between activities   Assistive device  Lofstrands;Prosthesis;Straight cane    Gait Pattern  Decreased step length - right;Decreased stance time - left;Decreased stride length;Decreased hip/knee flexion - left;Decreased weight shift to left;Left circumduction;Left hip hike;Antalgic;Lateral hip instability;Trunk flexed;Decreased trunk rotation;Abducted - left;Poor foot clearance - left;Step-through pattern    Ambulation Surface  Level;Indoor      High Level Balance   High Level Balance Activities  Side stepping;Backward walking    High Level Balance Comments  in paralle bars for safety using the straight cane, pt performed 3-4 reps of each with min guard to min assist for balance and cues on  posture/weight shifting.      Prosthetics   Current prosthetic wear tolerance (days/week)   daily    Current prosthetic wear tolerance (#hours/day)   reports all awake hours with a break somedays after dialysis for napping    Residual limb condition   no issues per pt report          Balance Exercises - 09/27/17 0922      Balance Exercises: Standing   Rockerboard  Anterior/posterior;Lateral;EO;EC;30 seconds;10 reps    Other Standing Exercises  4 square stepping using therabands on floor and cane support: 3-4 laps each with cues on incr step length and weight shifting needed. min guard to min assist with pt needing chair backs/PTA to steady self on at times.       Balance Exercises: Standing   Rebounder Limitations  performed both ways on balance board: holding it steady with arms at sides with EO, progressing to alternating UE raises, progressing to bil UE raises, progressing to arms at sides with EC. min guard to min assist for balance with cues on posture, weight shifting for balance assitance.           PT Short Term Goals - 08/25/17 1025      PT SHORT TERM GOAL #1   Title  Patient verbalizes adjusting ply socks for volume changes with minimal PT guidance. (This goal due 08/26/17)    Baseline  MET 08/23/2017    Status  Achieved      PT SHORT TERM GOAL #2   Title  Patient ambulates 400' with crutches engaging prosthesis with supervision / verbal cues.     Baseline  MET 08/25/2017    Status  Achieved      PT SHORT TERM GOAL #3   Title  Patient negotiates ramps, curbs with crutches & stairs with 1 rail/1 crutch with supervision.     Baseline  MET 08/25/2017    Status  Achieved      PT SHORT TERM GOAL #4   Title  Patient stands 2 minutes without UE support, reaches 5" and to floor with supervision.     Baseline  MET 08/23/2017    Status  Achieved        PT Long Term Goals - 09/20/17 1022      PT LONG TERM GOAL #1   Title  Patient demonstrates &  verbalizes proper  prosthetic care including dialysis issues to enable safe use of prosthesis. (All LTGs Target Date: 09/21/2017)    Baseline  09/20/17: met today    Time  --    Period  --    Status  Achieved      PT LONG TERM GOAL #2   Title  Patient tolerates prosthesis wear >90% of awake hours without skin or limb pain issues to enable function throughout his day.     Baseline  09/20/17: met today    Time  --    Period  --    Status  Achieved      PT LONG TERM GOAL #3   Title  Berg Balance with device >36/56 to indicate lower fall risk.     Baseline  09/20/17: 37/56 scored today    Time  --    Period  --    Status  Achieved      PT LONG TERM GOAL #4   Title  Patient ambulates 1000' outdoors including grass, ramps & curbs with cane or less and prosthesis modified independent.     Baseline  09/20/17: met with bil forearm crutches/prosthesis today and with previous sessions. not met at cane level    Time  --    Period  --    Status  Partially Met      PT LONG TERM GOAL #5   Title  Patient modified independent on stairs including descending alternate pattern with 2 rails and step-to pattern with 1 rail & cane engaging prosthetic hydraulics.     Baseline  09/20/17: does not fully engage prosthetic hydraulics    Time  --    Period  --    Status  Partially Met      PT LONG TERM GOAL #6   Title  Patient demonstrates & verbalizes tasks with prosthesis to perform gardening & cooking modified independent to enable return to his hobbies. 09/20/17: pt reports doing some of these, not back to them fully.     Time  --    Period  --    Status  Partially Met            Plan - 09/27/17 0850    Clinical Impression Statement  Today's skilled session contineud to address gait with straight cane and balance reactions/stepping strategies. The pt is progressing with less overall assistance needed with gait using the cane. The pt should benefit from continued PT to progress toward unmet goals.     Rehab Potential   Good    PT Frequency  2x / week    PT Duration  12 weeks    PT Treatment/Interventions  ADLs/Self Care Home Management;DME Instruction;Gait training;Stair training;Functional mobility training;Therapeutic activities;Therapeutic exercise;Balance training;Neuromuscular re-education;Canalith Repostioning;Patient/family education;Prosthetic Training;Vestibular    PT Next Visit Plan  prosthetic gait with cane, balance activities working toward updated STGs. Primary PT to complete recert.     Consulted and Agree with Plan of Care  Patient       Patient will benefit from skilled therapeutic intervention in order to improve the following deficits and impairments:  Decreased activity tolerance, Abnormal gait, Decreased balance, Decreased endurance, Decreased knowledge of use of DME, Decreased mobility, Decreased strength, Prosthetic Dependency, Postural dysfunction, Dizziness  Visit Diagnosis: Muscle weakness (generalized)  Other abnormalities of gait and mobility  Unsteadiness on feet  Abnormal posture     Problem List Patient Active Problem List   Diagnosis Date Noted  . Altered mental status   .  Cerebral thrombosis with cerebral infarction 02/06/2017  . Cerebral embolism with cerebral infarction 02/06/2017  . Above knee amputation status, left ([place])   . Pressure injury of skin 01/30/2017  . Acute lower UTI 01/29/2017  . Acute metabolic encephalopathy 79/03/8331  . UTI (urinary tract infection) 01/29/2017  . Quadriceps muscle rupture, left, initial encounter   . Fall 01/09/2017  . Gait disturbance 01/09/2017  . Staphylococcus aureus infection 01/09/2017  . Infection of prosthetic left knee joint ([place]) 01/09/2017  . Fever, unknown origin 11/09/2016  . Critical lower limb ischemia 08/25/2016  . Ulcer of left midfoot with fat layer exposed ([place]) 08/13/2016  . Diabetic ulcer of left midfoot associated with type 2 diabetes mellitus, with fat layer exposed ([place]) 07/25/2016  . Peripheral  neuropathy 07/22/2016  . Tobacco abuse 07/22/2016  . CAD in native artery 05/18/2016  . CAD, multiple vessel 05/11/2016  . Positive cardiac stress test 05/11/2016  . Abnormal stress test 04/30/2016  . Pre-transplant evaluation for kidney transplant 04/30/2016  . S/P revision of total knee 11/26/2015  . Pain in the chest   . Acute on chronic diastolic heart failure ([place]) 07/05/2015  . Volume overload 07/04/2015  . Shortness of breath 07/04/2015  . Hypoxemia 07/04/2015  . Elevated troponin   . End-stage renal disease on hemodialysis ([place])   . Hypervolemia   . 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 Pyogenic bacterial arthritis of knee, left ([place]) 08/07/2014  . Tachycardia 07/24/2014  . Acute upper respiratory infection 07/24/2014  . ESRD on dialysis ([place]) 07/14/2014  . Type II diabetes mellitus ([place]) 07/14/2014  . Anemia in chronic kidney disease 07/14/2014  . Congestive heart failure (CHF) ([place]) 07/13/2014  . Surgical wound dehiscence 05/09/2014  . Dehiscence of closure of skin 05/09/2014  . Total knee replacement status 04/10/2014  . Diabetes mellitus with renal manifestations, controlled ([place]) 10/24/2013  . Hypertensive renal disease 06/27/2013  . DM type 2 causing vascular disease ([place]) 06/27/2013  . Erectile dysfunction 06/27/2013  . Depression 06/27/2013  . Claudication of left lower extremity ([place]) 12/19/2012  . Essential hypertension, benign 12/19/2012  . Sinusitis, acute maxillary 11/22/2012  . Otitis, externa, infective 11/14/2012  . Leg edema, left 11/14/2012  . End stage renal disease ([place]) 10/02/2012  . Controlled type 2 DM with proteinuria or microalbuminuria 09/19/2012  . GERD (gastroesophageal reflux disease) 09/19/2012  . Leukocytosis 09/19/2012  . Lacunar infarction ([place]) 08/17/2012  . Polymyalgia rheumatica ([place]) 08/17/2012  . 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 Essential hypertension 05/10/2012  . Vitamin D deficiency 05/10/2012  .  Diabetes mellitus due to underlying condition ([place]) 05/10/2012  . Hyperlipidemia LDL goal <100 05/10/2012  . Anemia of chronic disease 05/10/2012  . Screening for prostate cancer 05/10/2012  . Chronic kidney disease (CKD), stage IV (severe) ([place]) 05/10/2012  . Peripheral autonomic neuropathy due to DM ([place]) 05/10/2012  . Callus of foot 05/10/2012  . Urgency of urination 05/10/2012  . Hyperkalemia 05/10/2012  . Candidiasis of the esophagus 10/12/2011  . Internal hemorrhoids without mention of complication 83/29/1916  . Pre-syncope 07/25/2009  . DJD (degenerative joint disease) of cervical spine 02/17/2009    Willow Ora, PTA, [facility] [address] [place], [place] [address] [phone] 09/28/17, 1:00 PM   Name: Matthew Kline MRN: [id number] Date of Birth: 07-Oct-1960

## 2017-09-29 ENCOUNTER — Ambulatory Visit: Payer: Medicare HMO | Admitting: Physical Therapy

## 2017-10-04 ENCOUNTER — Ambulatory Visit: Payer: Medicare HMO | Admitting: Physical Therapy

## 2017-10-06 ENCOUNTER — Ambulatory Visit: Payer: Medicare HMO | Admitting: Physical Therapy

## 2017-10-06 ENCOUNTER — Encounter: Payer: Self-pay | Admitting: Physical Therapy

## 2017-10-06 DIAGNOSIS — R2689 Other abnormalities of gait and mobility: Secondary | ICD-10-CM

## 2017-10-06 DIAGNOSIS — Z9181 History of falling: Secondary | ICD-10-CM

## 2017-10-06 DIAGNOSIS — R293 Abnormal posture: Secondary | ICD-10-CM

## 2017-10-06 DIAGNOSIS — R2681 Unsteadiness on feet: Secondary | ICD-10-CM

## 2017-10-06 DIAGNOSIS — M6281 Muscle weakness (generalized): Secondary | ICD-10-CM

## 2017-10-07 NOTE — Therapy (Signed)
[facility] [address] [place], [place], [address] Phone: [phone]   Fax:  [phone]  Physical Therapy Treatment  Patient Details  Name: Matthew Kline MRN: [id number] Date of Birth: 12/31/1960 Referring Provider (PT): Meridee Score, MD   Encounter Date: 10/06/2017  PT End of Session - 10/06/17 1618    Visit Number  20    Number of Visits  42    Date for PT Re-Evaluation  12/21/17    Authorization Type  [employer]    PT Start Time  0845    PT Stop Time  0930    PT Time Calculation (min)  45 min    Equipment Utilized During Treatment  Gait belt    Activity Tolerance  Patient tolerated treatment well    Behavior During Therapy  [facility] for tasks assessed/performed       Past Medical History:  Diagnosis Date  . Anemia, unspecified   . Anxiety   . Arthralgia 2010   polyarticular  . Arthritis    "back, knees" (01/10/2017)  . Cancer [facility])    "kidney area" (01/10/2017)  . CHF (congestive heart failure) ([place]) 07/25/2009   denies  . Chronic lower back pain   . Coronary artery disease   . Coughing    pt. reports that he has drainage from sinus infection  . Diabetic foot ulcer ([place])   . Diabetic neuropathy ([place])   . ESRD (end stage renal disease) on dialysis [facility])    started 12/2012; "MWF; [place] "  (01/10/2017)  . GERD (gastroesophageal reflux disease)    hx "before I lost weight", no problem 9 years  . Hemodialysis access site with mature fistula ([place])   . Hemorrhoids, internal 10/2011   small  . High cholesterol   . History of blood transfusion    "related to the anemia"  . Hypertension   . Insomnia, unspecified   . Lacunar infarction ([place]) 2006   RUE/RLE, speech  . Long term (current) use of anticoagulants   . Myocardial infarction ([place]) 1995  . Orthostatic hypotension   . Osteomyelitis of foot, left, acute ([place])   . Other chronic postoperative pain   . Pneumonia    "probably  twice" (01/10/2017)  . Polymyalgia rheumatica ([place])   . Renal insufficiency   . Sleep apnea    "lost weight; no more problem" (01/10/2017)  . Stroke ([place]) 01/10/06   denies residual on 05/09/2014  . Type II diabetes mellitus ([place]) dx'd 1995  . Unspecified hereditary and idiopathic peripheral neuropathy    feet  . Unspecified osteomyelitis, site unspecified   . Unspecified vitamin D deficiency     Past Surgical History:  Procedure Laterality Date  . ABDOMINAL AORTOGRAM N/A 08/25/2016   Procedure: ABDOMINAL AORTOGRAM;  Surgeon: Wellington Hampshire, MD;  Location: [place] [facility];  Service: Cardiovascular;  Laterality: N/A;  . AMPUTATION  01/21/2012   Procedure: AMPUTATION RAY;  Surgeon: [name], MD;  Location: [facility];  Service: Orthopedics;  Laterality: Left;  Left Foot 4th Ray Amputation  . AMPUTATION Left 05/04/2013   Procedure: AMPUTATION DIGIT;  Surgeon: [name], MD;  Location: [facility];  Service: Orthopedics;  Laterality: Left;  Left Great Toe Amputation at [facility]  . AMPUTATION Left 01/14/2017   Procedure: AMPUTATION ABOVE LEFT KNEE;  Surgeon: [name], MD;  Location: [facility];  Service: Orthopedics;  Laterality: Left;  . ANTERIOR CERVICAL DECOMP/DISCECTOMY FUSION  02/2011  . BACK SURGERY    .  BASCILIC VEIN TRANSPOSITION Left 10/19/2012   Procedure: BASCILIC VEIN TRANSPOSITION;  Surgeon: Serafina Mitchell, MD;  Location: [facility] [place];  Service: Vascular;  Laterality: Left;  . CARDIAC CATHETERIZATION     "before bypass"  . CORONARY ARTERY BYPASS GRAFT     x 5 with lima at [place] WITH ANTIBIOTIC SPACERS Left 08/07/2014   Procedure: Replace Left Total Knee Arthroplasty,  Place Antibiotic Spacer;  Surgeon: [name], MD;  Location: [facility];  Service: Orthopedics;  Laterality: Left;  . I&D EXTREMITY Left 05/09/2014   Procedure: Irrigation and Debridement Left Knee and Closure of Total Knee Arthroplasty Incision;  Surgeon: [name], MD;   Location: [place];  Service: Orthopedics;  Laterality: Left;  . I&D KNEE WITH POLY EXCHANGE Left 05/31/2014   Procedure: IRRIGATION AND DEBRIDEMENT LEFT KNEE, PLACE ANTIBIOTIC BEADS,  POLY EXCHANGE;  Surgeon: [name], MD;  Location: [facility];  Service: Orthopedics;  Laterality: Left;  . IRRIGATION AND DEBRIDEMENT KNEE Left 01/12/2017   Procedure: IRRIGATION AND DEBRIDEMENT LEFT KNEE;  Surgeon: [name], MD;  Location: [place];  Service: Orthopedics;  Laterality: Left;  . JOINT REPLACEMENT    . KNEE ARTHROSCOPY Left 08-25-2012  . LOWER EXTREMITY ANGIOGRAPHY Left 08/25/2016   Procedure: Lower Extremity Angiography;  Surgeon: Wellington Hampshire, MD;  Location: [facility];  Service: Cardiovascular;  Laterality: Left;  . PERIPHERAL VASCULAR BALLOON ANGIOPLASTY Left 08/25/2016   Procedure: PERIPHERAL VASCULAR BALLOON ANGIOPLASTY;  Surgeon: Wellington Hampshire, MD;  Location: [facility] [facility];  Service: Cardiovascular;  Laterality: Left;  lt peroneal and ant tibial arteries cutting balloon  . REFRACTIVE SURGERY Bilateral   . TOE AMPUTATION Bilateral    "I've lost 7 toes over the last 7 years" (05/09/2014)  . TOE SURGERY Left April 2015   Big toe removed on left foot.  . TONSILLECTOMY    . TOTAL KNEE ARTHROPLASTY Left 04/10/2014   Procedure: TOTAL KNEE ARTHROPLASTY;  Surgeon: [name], MD;  Location: [facility];  Service: Orthopedics;  Laterality: Left;  . TOTAL KNEE REVISION Left 10/25/2014   Procedure: LEFT TOTAL KNEE REVISION;  Surgeon: [name], MD;  Location: [facility];  Service: Orthopedics;  Laterality: Left;  . TOTAL KNEE REVISION Left 11/26/2015   Procedure: Removal Left Total Knee Arthroplasty, Hinged Total Knee Arthroplasty;  Surgeon: [name], MD;  Location: [facility];  Service: Orthopedics;  Laterality: Left;  . UVULOPALATOPHARYNGOPLASTY, TONSILLECTOMY AND SEPTOPLASTY  ~ 1989  . WOUND DEBRIDEMENT Left 05/09/2014   Dehiscence Left Total Knee Arthroplasty Incision    There were  no vitals filed for this visit.  Subjective Assessment - 10/06/17 0849    Subjective  He is having dialysis 4 days/wk for last 3-4 weeks as unable to get down to dry weight. No issues with getting prosthesis on limb or fit. No falls.     Pertinent History  Left TFA, ESRD with dialysis M,W,F, CHF, CAD s/p CABG 05/2016, PAD, CVA, DM2, neuropathy, arthritis, right toe amputations    Limitations  Lifting;Standing;Walking;House hold activities    Patient Stated Goals  He wants to use prosthesis to walk without assistance, garden, cook / work in [employer]     Currently in Pain?  No/denies                       [facility] Adult PT Treatment/Exercise - 10/06/17 0845      Transfers  Transfers  Sit to Stand;Stand to Sit    Sit to Stand  5: Supervision;With upper extremity assist;With armrests;From chair/3-in-1   working on not touching for stabilization   Stand to Sit  5: Supervision;With upper extremity assist;With armrests;To chair/3-in-1   working on not touching for stabilization     Ambulation/Gait   Ambulation/Gait  Yes    Ambulation/Gait Assistance  4: Min guard;4: Min assist    Ambulation/Gait Assistance Details  verbal & tactile cues on posture, wt shift over prosthesis and using wall touch LUE and cane RUE to work on gait without assistance     Ambulation Distance (Feet)  200 Feet   200' x 2   Assistive device  Prosthesis;Straight cane   cane with quad tip,  arrived/exit with loftstrand crutches   Gait Pattern  Decreased step length - right;Decreased stance time - left;Decreased stride length;Decreased hip/knee flexion - left;Decreased weight shift to left;Left circumduction;Left hip hike;Antalgic;Lateral hip instability;Trunk flexed;Decreased trunk rotation;Abducted - left;Poor foot clearance - left;Step-through pattern    Ambulation Surface  Indoor;Level      Neuro Re-ed    Neuro Re-ed Details   cues on prosthesis position for stationary activities including conversing for  prosthesis to support  with equal LE wt bearing and prosthetic hydraulics does not "bleed" out.  Pt return demo understanding      Knee/Hip Exercises: Aerobic   Stepper  Recumbent SciFit level 1 with BUEs & RLE 6 min 2 sets.  (He used prosthesis for first 2.34min but not programed for cycle mode yet so removed from machine plate)  PT instructed in modes of his prosthesis and having prosthetist program a cycle (no resistance) and lock mode.       Prosthetics   Prosthetic Care Comments   PT instructed in modes of his prosthesis and having prosthetist program a cycle (no resistance) and lock mode.     Current prosthetic wear tolerance (days/week)   daily    Current prosthetic wear tolerance (#hours/day)   reports all awake hours with a break somedays after dialysis for napping    Residual limb condition   no issues per pt report             PT Education - 10/06/17 0915    Education Details  use of recumbent (seated back support) that uses BUEs & BLEs like SciFit recumbent stepper to build endurance-start with sets work 6 min rest 4 min 3 sets and increase work time as able. Proper set-up of machine.   Joining community fitness that has features like proper equipment, classes and pool - goal is to workout 3 or more times / week between PT & fitness center while still in PT    Person(s) Educated  Patient    Methods  Explanation;Verbal cues    Comprehension  Verbalized understanding       PT Short Term Goals - 10/06/17 1800      PT SHORT TERM GOAL #1   Title  Patient ambulates 100' with cane RUE & wall support LUE with supervision / cues (All updated STGs Target Date: 10/21/2017)    Time  4    Period  Weeks    Status  New    Target Date  10/21/17      PT SHORT TERM GOAL #2   Title  Patient ambulates 300' with cane & hand hold assist engaging prosthesis.     Time  4    Period  Weeks    Status  New    Target Date  10/21/17      PT SHORT TERM GOAL #3   Title  Patient negotiates  ramps & curbs with cane & moderate hand hold assist.    Time  4    Period  Weeks    Status  New    Target Date  10/21/17        PT Long Term Goals - 10/06/17 1800      PT LONG TERM GOAL #1   Title  Patient verbalizes proper prosthetic care including problem solving issues and when to contact prosthetist. (All updated LTGs Target Date: 12/21/2017)    Time  3    Period  Months    Status  On-going    Target Date  12/21/17      PT LONG TERM GOAL #2   Title  Patient tolerates prosthesis wear >90% of awake hours without skin or limb pain issues to enable function throughout his day.     Time  3    Period  Months    Status  On-going    Target Date  12/21/17      PT LONG TERM GOAL #3   Title  Berg Balance with device >45/56 to indicate lower fall risk.     Time  3    Period  Months    Status  Revised    Target Date  12/21/17      PT LONG TERM GOAL #4   Title  Patient ambulates 1000' outdoors including grass, ramps & curbs with cane or less and prosthesis modified independent.     Time  3    Period  Months    Status  On-going    Target Date  12/21/17      PT LONG TERM GOAL #5   Title  Patient descends stairs with alternate pattern with single rail & cane and negotiates stairs without rails with cane step-to pattern modified independent.     Time  3    Period  Months    Status  Revised    Target Date  12/21/17      PT LONG TERM GOAL #6   Title  Patient ambulates around furniture with prosthesis only carrying light weight household items modified independent.     Time  3    Period  Months    Status  New    Target Date  12/21/17            Plan - 10/06/17 1625    Clinical Impression Statement  Patient has progressed prosthetic gait with use of BUE support of loftstrand crutches. PT working on progressing gait to cane for community and no device for household mobility. He needs additional PT to achieve this potential. PT instructed today on incorporating additional  fitness at [facility] or [place] with patient to investigate which has proper equipment that PT recommended.     Rehab Potential  Good    PT Frequency  2x / week    PT Duration  12 weeks    PT Treatment/Interventions  ADLs/Self Care Home Management;DME Instruction;Gait training;Stair training;Functional mobility training;Therapeutic activities;Therapeutic exercise;Balance training;Neuromuscular re-education;Canalith Repostioning;Patient/family education;Prosthetic Training;Vestibular    PT Next Visit Plan  prosthetic gait with cane, balance activities working toward updated STGs.    Consulted and Agree with Plan of Care  Patient       Patient will benefit from skilled therapeutic intervention in order to improve the following deficits and impairments:  Decreased activity tolerance,  Abnormal gait, Decreased balance, Decreased endurance, Decreased knowledge of use of DME, Decreased mobility, Decreased strength, Prosthetic Dependency, Postural dysfunction, Dizziness  Visit Diagnosis: Muscle weakness (generalized)  Other abnormalities of gait and mobility  Abnormal posture  Unsteadiness on feet  History of falling     Problem List Patient Active Problem List   Diagnosis Date Noted  . Altered mental status   . Cerebral thrombosis with cerebral infarction 02/06/2017  . Cerebral embolism with cerebral infarction 02/06/2017  . Above knee amputation status, left ([place])   . Pressure injury of skin 01/30/2017  . Acute lower UTI 01/29/2017  . Acute metabolic encephalopathy 48/18/5631  . UTI (urinary tract infection) 01/29/2017  . Quadriceps muscle rupture, left, initial encounter   . Fall 01/09/2017  . Gait disturbance 01/09/2017  . Staphylococcus aureus infection 01/09/2017  . Infection of prosthetic left knee joint ([place]) 01/09/2017  . Fever, unknown origin 11/09/2016  . Critical lower limb ischemia 08/25/2016  . Ulcer of left midfoot with fat layer exposed ([place]) 08/13/2016  . Diabetic  ulcer of left midfoot associated with type 2 diabetes mellitus, with fat layer exposed ([place]) 07/25/2016  . Peripheral neuropathy 07/22/2016  . Tobacco abuse 07/22/2016  . CAD in native artery 05/18/2016  . CAD, multiple vessel 05/11/2016  . Positive cardiac stress test 05/11/2016  . Abnormal stress test 04/30/2016  . Pre-transplant evaluation for kidney transplant 04/30/2016  . S/P revision of total knee 11/26/2015  . Pain in the chest   . Acute on chronic diastolic heart failure ([place]) 07/05/2015  . Volume overload 07/04/2015  . Shortness of breath 07/04/2015  . Hypoxemia 07/04/2015  . Elevated troponin   . End-stage renal disease on hemodialysis ([place])   . Hypervolemia   . 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 Pyogenic bacterial arthritis of knee, left ([place]) 08/07/2014  . Tachycardia 07/24/2014  . Acute upper respiratory infection 07/24/2014  . ESRD on dialysis ([place]) 07/14/2014  . Type II diabetes mellitus ([place]) 07/14/2014  . Anemia in chronic kidney disease 07/14/2014  . Congestive heart failure (CHF) ([place]) 07/13/2014  . Surgical wound dehiscence 05/09/2014  . Dehiscence of closure of skin 05/09/2014  . Total knee replacement status 04/10/2014  . Diabetes mellitus with renal manifestations, controlled ([place]) 10/24/2013  . Hypertensive renal disease 06/27/2013  . DM type 2 causing vascular disease ([place]) 06/27/2013  . Erectile dysfunction 06/27/2013  . Depression 06/27/2013  . Claudication of left lower extremity ([place]) 12/19/2012  . Essential hypertension, benign 12/19/2012  . Sinusitis, acute maxillary 11/22/2012  . Otitis, externa, infective 11/14/2012  . Leg edema, left 11/14/2012  . End stage renal disease ([place]) 10/02/2012  . Controlled type 2 DM with proteinuria or microalbuminuria 09/19/2012  . GERD (gastroesophageal reflux disease) 09/19/2012  . Leukocytosis 09/19/2012  . Lacunar infarction ([place]) 08/17/2012  . Polymyalgia rheumatica ([place]) 08/17/2012  .  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 Essential hypertension 05/10/2012  . Vitamin D deficiency 05/10/2012  . Diabetes mellitus due to underlying condition ([place]) 05/10/2012  . Hyperlipidemia LDL goal <100 05/10/2012  . Anemia of chronic disease 05/10/2012  . Screening for prostate cancer 05/10/2012  . Chronic kidney disease (CKD), stage IV (severe) ([place]) 05/10/2012  . Peripheral autonomic neuropathy due to DM ([place]) 05/10/2012  . Callus of foot 05/10/2012  . Urgency of urination 05/10/2012  . Hyperkalemia 05/10/2012  . Candidiasis of the esophagus 10/12/2011  . Internal hemorrhoids without mention of complication 49/70/2637  . Pre-syncope 07/25/2009  . DJD (degenerative joint  disease) of cervical spine 02/17/2009    Jamey Reas PT, DPT 10/07/2017, 6:40 AM  [facility] [address] [place], [place], [address] Phone: [phone]   Fax:  [phone]  Name: Matthew Kline MRN: [id number] Date of Birth: [birth year]

## 2017-10-11 ENCOUNTER — Ambulatory Visit: Payer: Medicare HMO | Attending: Orthopedic Surgery | Admitting: Physical Therapy

## 2017-10-11 ENCOUNTER — Encounter: Payer: Self-pay | Admitting: Physical Therapy

## 2017-10-11 DIAGNOSIS — M6281 Muscle weakness (generalized): Secondary | ICD-10-CM | POA: Insufficient documentation

## 2017-10-11 DIAGNOSIS — R293 Abnormal posture: Secondary | ICD-10-CM

## 2017-10-11 DIAGNOSIS — R2681 Unsteadiness on feet: Secondary | ICD-10-CM | POA: Insufficient documentation

## 2017-10-11 DIAGNOSIS — Z9181 History of falling: Secondary | ICD-10-CM | POA: Diagnosis present

## 2017-10-11 DIAGNOSIS — R2689 Other abnormalities of gait and mobility: Secondary | ICD-10-CM | POA: Insufficient documentation

## 2017-10-11 NOTE — Therapy (Signed)
[facility] [address] [place], [place], [address] Phone: [phone]   Fax:  [phone]  Physical Therapy Treatment  Patient Details  Name: Matthew Kline MRN: [id number] Date of Birth: [birth year] Referring Provider (PT): Meridee Score, MD   Encounter Date: 10/11/2017  PT End of Session - 10/11/17 0853    Visit Number  21    Number of Visits  42    Date for PT Re-Evaluation  12/21/17    Authorization Type  [employer]    PT Start Time  0849    PT Stop Time  0929    PT Time Calculation (min)  40 min    Equipment Utilized During Treatment  Gait belt    Activity Tolerance  Patient tolerated treatment well    Behavior During Therapy  [facility] for tasks assessed/performed       Past Medical History:  Diagnosis Date  . Anemia, unspecified   . Anxiety   . Arthralgia 2010   polyarticular  . Arthritis    "back, knees" (01/10/2017)  . Cancer [facility])    "kidney area" (01/10/2017)  . CHF (congestive heart failure) ([place]) 07/25/2009   denies  . Chronic lower back pain   . Coronary artery disease   . Coughing    pt. reports that he has drainage from sinus infection  . Diabetic foot ulcer ([place])   . Diabetic neuropathy ([place])   . ESRD (end stage renal disease) on dialysis [facility])    started 12/2012; "MWF; [place] "  (01/10/2017)  . GERD (gastroesophageal reflux disease)    hx "before I lost weight", no problem 9 years  . Hemodialysis access site with mature fistula ([place])   . Hemorrhoids, internal 10/2011   small  . High cholesterol   . History of blood transfusion    "related to the anemia"  . Hypertension   . Insomnia, unspecified   . Lacunar infarction ([place]) 2006   RUE/RLE, speech  . Long term (current) use of anticoagulants   . Myocardial infarction ([place]) 1995  . Orthostatic hypotension   . Osteomyelitis of foot, left, acute ([place])   . Other chronic postoperative pain   . Pneumonia    "probably  twice" (01/10/2017)  . Polymyalgia rheumatica ([place])   . Renal insufficiency   . Sleep apnea    "lost weight; no more problem" (01/10/2017)  . Stroke ([place]) 01/10/06   denies residual on 05/09/2014  . Type II diabetes mellitus ([place]) dx'd 1995  . Unspecified hereditary and idiopathic peripheral neuropathy    feet  . Unspecified osteomyelitis, site unspecified   . Unspecified vitamin D deficiency     Past Surgical History:  Procedure Laterality Date  . ABDOMINAL AORTOGRAM N/A 08/25/2016   Procedure: ABDOMINAL AORTOGRAM;  Surgeon: Wellington Hampshire, MD;  Location: [place] [facility];  Service: Cardiovascular;  Laterality: N/A;  . AMPUTATION  01/21/2012   Procedure: AMPUTATION RAY;  Surgeon: [name], MD;  Location: [facility];  Service: Orthopedics;  Laterality: Left;  Left Foot 4th Ray Amputation  . AMPUTATION Left 05/04/2013   Procedure: AMPUTATION DIGIT;  Surgeon: [name], MD;  Location: [facility];  Service: Orthopedics;  Laterality: Left;  Left Great Toe Amputation at [facility]  . AMPUTATION Left 01/14/2017   Procedure: AMPUTATION ABOVE LEFT KNEE;  Surgeon: [name], MD;  Location: [facility];  Service: Orthopedics;  Laterality: Left;  . ANTERIOR CERVICAL DECOMP/DISCECTOMY FUSION  02/2011  . BACK SURGERY    .  BASCILIC VEIN TRANSPOSITION Left 10/19/2012   Procedure: BASCILIC VEIN TRANSPOSITION;  Surgeon: Serafina Mitchell, MD;  Location: [facility];  Service: Vascular;  Laterality: Left;  . CARDIAC CATHETERIZATION     "before bypass"  . CORONARY ARTERY BYPASS GRAFT     x 5 with lima at [place] WITH ANTIBIOTIC SPACERS Left 08/07/2014   Procedure: Replace Left Total Knee Arthroplasty,  Place Antibiotic Spacer;  Surgeon: [name], MD;  Location: [facility];  Service: Orthopedics;  Laterality: Left;  . I&D EXTREMITY Left 05/09/2014   Procedure: Irrigation and Debridement Left Knee and Closure of Total Knee Arthroplasty Incision;  Surgeon: [name], MD;   Location: [facility];  Service: Orthopedics;  Laterality: Left;  . I&D KNEE WITH POLY EXCHANGE Left 05/31/2014   Procedure: IRRIGATION AND DEBRIDEMENT LEFT KNEE, PLACE ANTIBIOTIC BEADS,  POLY EXCHANGE;  Surgeon: [name], MD;  Location: [facility] [place];  Service: Orthopedics;  Laterality: Left;  . IRRIGATION AND DEBRIDEMENT KNEE Left 01/12/2017   Procedure: IRRIGATION AND DEBRIDEMENT LEFT KNEE;  Surgeon: [name], MD;  Location: [facility];  Service: Orthopedics;  Laterality: Left;  . JOINT REPLACEMENT    . KNEE ARTHROSCOPY Left 08-25-2012  . LOWER EXTREMITY ANGIOGRAPHY Left 08/25/2016   Procedure: Lower Extremity Angiography;  Surgeon: Wellington Hampshire, MD;  Location: [place];  Service: Cardiovascular;  Laterality: Left;  . PERIPHERAL VASCULAR BALLOON ANGIOPLASTY Left 08/25/2016   Procedure: PERIPHERAL VASCULAR BALLOON ANGIOPLASTY;  Surgeon: Wellington Hampshire, MD;  Location: [place] [facility];  Service: Cardiovascular;  Laterality: Left;  lt peroneal and ant tibial arteries cutting balloon  . REFRACTIVE SURGERY Bilateral   . TOE AMPUTATION Bilateral    "I've lost 7 toes over the last 7 years" (05/09/2014)  . TOE SURGERY Left April 2015   Big toe removed on left foot.  . TONSILLECTOMY    . TOTAL KNEE ARTHROPLASTY Left 04/10/2014   Procedure: TOTAL KNEE ARTHROPLASTY;  Surgeon: [name], MD;  Location: [facility];  Service: Orthopedics;  Laterality: Left;  . TOTAL KNEE REVISION Left 10/25/2014   Procedure: LEFT TOTAL KNEE REVISION;  Surgeon: [name], MD;  Location: [facility];  Service: Orthopedics;  Laterality: Left;  . TOTAL KNEE REVISION Left 11/26/2015   Procedure: Removal Left Total Knee Arthroplasty, Hinged Total Knee Arthroplasty;  Surgeon: [name], MD;  Location: [facility];  Service: Orthopedics;  Laterality: Left;  . UVULOPALATOPHARYNGOPLASTY, TONSILLECTOMY AND SEPTOPLASTY  ~ 1989  . WOUND DEBRIDEMENT Left 05/09/2014   Dehiscence Left Total Knee Arthroplasty Incision    There were  no vitals filed for this visit.  Subjective Assessment - 10/11/17 0851    Subjective  No new complaints. No falls or pain to report. Down to 3 times for dialysis this week. Did cramp all night last night in right leg from dialysis yesterday.     Pertinent History  Left TFA, ESRD with dialysis M,W,F, CHF, CAD s/p CABG 05/2016, PAD, CVA, DM2, neuropathy, arthritis, right toe amputations    Limitations  Lifting;Standing;Walking;House hold activities    Patient Stated Goals  He wants to use prosthesis to walk without assistance, garden, cook / work in [employer]     Currently in Pain?  No/denies    Pain Score  0-No pain           [facility] Adult PT Treatment/Exercise - 10/11/17 0853      Transfers   Transfers  Sit to Stand;Stand to Sit    Sit to Stand  5: Supervision;With upper extremity assist;With armrests;From chair/3-in-1    Stand to Sit  5: Supervision;With upper extremity assist;With armrests;To chair/3-in-1      Ambulation/Gait   Ambulation/Gait  Yes    Ambulation/Gait Assistance  4: Min guard;4: Min assist;3: Mod assist    Ambulation/Gait Assistance Details  multi modal cues on posture, step length and weight shifting with gait. increased assistance needed as pt fatigued with gait/activity in gym     Ambulation Distance (Feet)  120 Feet   x1, plus around gym with activity   Assistive device  Prosthesis;Straight cane    Gait Pattern  Decreased step length - right;Decreased stance time - left;Decreased stride length;Decreased hip/knee flexion - left;Decreased weight shift to left;Left circumduction;Left hip hike;Antalgic;Lateral hip instability;Trunk flexed;Decreased trunk rotation;Abducted - left;Poor foot clearance - left;Step-through pattern    Ambulation Surface  Level;Outdoor      High Level Balance   High Level Balance Activities  Side stepping;Backward walking    High Level Balance Comments  next to counter top with cane support: side stepping with cane x 3 laps each way, then bwd  walking with cane x 4 laps. min guard to min assist for balance with occasional touch to counter or PTA for balance. cues on posture, step length and weight shifitng with gait.       Neuro Re-ed    Neuro Re-ed Details   for balance/muscle re-ed: both ways on balance board with no UE support: "W", then "X<>Y" x 10 reps each, then holding board steady for EC no head movements, progressing to EC head movements left<>right, then up<>down. min guard to min assist for balance, cues on posture and weight shifting for balance.       Prosthetics   Prosthetic Care Comments   Has not called to set appt to see [name] for app and prosthesis adjustment. He has gone to [employer] (downtown) and checked out equipment.     Current prosthetic wear tolerance (days/week)   daily    Current prosthetic wear tolerance (#hours/day)   reports all awake hours with a break somedays after dialysis for napping    Residual limb condition   no issues per pt report    Donning Prosthesis  Supervision    Doffing Prosthesis  Supervision           PT Short Term Goals - 10/06/17 1800      PT SHORT TERM GOAL #1   Title  Patient ambulates 100' with cane RUE & wall support LUE with supervision / cues (All updated STGs Target Date: 10/21/2017)    Time  4    Period  Weeks    Status  New    Target Date  10/21/17      PT SHORT TERM GOAL #2   Title  Patient ambulates 300' with cane & hand hold assist engaging prosthesis.     Time  4    Period  Weeks    Status  New    Target Date  10/21/17      PT SHORT TERM GOAL #3   Title  Patient negotiates ramps & curbs with cane & moderate hand hold assist.    Time  4    Period  Weeks    Status  New    Target Date  10/21/17        PT Long Term Goals - 10/06/17 1800      PT  LONG TERM GOAL #1   Title  Patient verbalizes proper prosthetic care including problem solving issues and when to contact prosthetist. (All updated LTGs Target Date: 12/21/2017)    Time  3    Period  Months     Status  On-going    Target Date  12/21/17      PT LONG TERM GOAL #2   Title  Patient tolerates prosthesis wear >90% of awake hours without skin or limb pain issues to enable function throughout his day.     Time  3    Period  Months    Status  On-going    Target Date  12/21/17      PT LONG TERM GOAL #3   Title  Berg Balance with device >45/56 to indicate lower fall risk.     Time  3    Period  Months    Status  Revised    Target Date  12/21/17      PT LONG TERM GOAL #4   Title  Patient ambulates 1000' outdoors including grass, ramps & curbs with cane or less and prosthesis modified independent.     Time  3    Period  Months    Status  On-going    Target Date  12/21/17      PT LONG TERM GOAL #5   Title  Patient descends stairs with alternate pattern with single rail & cane and negotiates stairs without rails with cane step-to pattern modified independent.     Time  3    Period  Months    Status  Revised    Target Date  12/21/17      PT LONG TERM GOAL #6   Title  Patient ambulates around furniture with prosthesis only carrying light weight household items modified independent.     Time  3    Period  Months    Status  New    Target Date  12/21/17            Plan - 10/11/17 0853    Clinical Impression Statement  Today's skilled session continued to address gait/balance with prosthesis/cane and balance activiites with decreased UE support. The pt continues to make steady progress toward goals and should benefit from continued PT to progress toward unmet goals.    Rehab Potential  Good    PT Frequency  2x / week    PT Duration  12 weeks    PT Treatment/Interventions  ADLs/Self Care Home Management;DME Instruction;Gait training;Stair training;Functional mobility training;Therapeutic activities;Therapeutic exercise;Balance training;Neuromuscular re-education;Canalith Repostioning;Patient/family education;Prosthetic Training;Vestibular    PT Next Visit Plan  prosthetic  gait with cane, balance activities working toward updated STGs.    Consulted and Agree with Plan of Care  Patient       Patient will benefit from skilled therapeutic intervention in order to improve the following deficits and impairments:  Decreased activity tolerance, Abnormal gait, Decreased balance, Decreased endurance, Decreased knowledge of use of DME, Decreased mobility, Decreased strength, Prosthetic Dependency, Postural dysfunction, Dizziness  Visit Diagnosis: Muscle weakness (generalized)  Other abnormalities of gait and mobility  Abnormal posture  Unsteadiness on feet     Problem List Patient Active Problem List   Diagnosis Date Noted  . Altered mental status   . Cerebral thrombosis with cerebral infarction 02/06/2017  . Cerebral embolism with cerebral infarction 02/06/2017  . Above knee amputation status, left   . Pressure injury of skin 01/30/2017  . Acute lower UTI 01/29/2017  . Acute  metabolic encephalopathy 70/48/8891  . UTI (urinary tract infection) 01/29/2017  . Quadriceps muscle rupture, left, initial encounter   . Fall 01/09/2017  . Gait disturbance 01/09/2017  . Staphylococcus aureus infection 01/09/2017  . Infection of prosthetic left knee joint ([place]) 01/09/2017  . Fever, unknown origin 11/09/2016  . Critical lower limb ischemia 08/25/2016  . Ulcer of left midfoot with fat layer exposed ([place]) 08/13/2016  . Diabetic ulcer of left midfoot associated with type 2 diabetes mellitus, with fat layer exposed ([place]) 07/25/2016  . Peripheral neuropathy 07/22/2016  . Tobacco abuse 07/22/2016  . CAD in native artery 05/18/2016  . CAD, multiple vessel 05/11/2016  . Positive cardiac stress test 05/11/2016  . Abnormal stress test 04/30/2016  . Pre-transplant evaluation for kidney transplant 04/30/2016  . S/P revision of total knee 11/26/2015  . Pain in the chest   . Acute on chronic diastolic heart failure ([place]) 07/05/2015  . Volume overload 07/04/2015  .  Shortness of breath 07/04/2015  . Hypoxemia 07/04/2015  . Elevated troponin   . End-stage renal disease on hemodialysis ([place])   . Hypervolemia   . 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 Pyogenic bacterial arthritis of knee, left ([place]) 08/07/2014  . Tachycardia 07/24/2014  . Acute upper respiratory infection 07/24/2014  . ESRD on dialysis ([place]) 07/14/2014  . Type II diabetes mellitus ([place]) 07/14/2014  . Anemia in chronic kidney disease 07/14/2014  . Congestive heart failure (CHF) ([place]) 07/13/2014  . Surgical wound dehiscence 05/09/2014  . Dehiscence of closure of skin 05/09/2014  . Total knee replacement status 04/10/2014  . Diabetes mellitus with renal manifestations, controlled ([place]) 10/24/2013  . Hypertensive renal disease 06/27/2013  . DM type 2 causing vascular disease ([place]) 06/27/2013  . Erectile dysfunction 06/27/2013  . Depression 06/27/2013  . Claudication of left lower extremity ([place]) 12/19/2012  . Essential hypertension, benign 12/19/2012  . Sinusitis, acute maxillary 11/22/2012  . Otitis, externa, infective 11/14/2012  . Leg edema, left 11/14/2012  . End stage renal disease ([place]) 10/02/2012  . Controlled type 2 DM with proteinuria or microalbuminuria 09/19/2012  . GERD (gastroesophageal reflux disease) 09/19/2012  . Leukocytosis 09/19/2012  . Lacunar infarction ([place]) 08/17/2012  . Polymyalgia rheumatica ([place]) 08/17/2012  . 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 Essential hypertension 05/10/2012  . Vitamin D deficiency 05/10/2012  . Diabetes mellitus due to underlying condition ([place]) 05/10/2012  . Hyperlipidemia LDL goal <100 05/10/2012  . Anemia of chronic disease 05/10/2012  . Screening for prostate cancer 05/10/2012  . Chronic kidney disease (CKD), stage IV (severe) ([place]) 05/10/2012  . Peripheral autonomic neuropathy due to DM ([place]) 05/10/2012  . Callus of foot 05/10/2012  . Urgency of urination 05/10/2012  . Hyperkalemia 05/10/2012  . Candidiasis  of the esophagus 10/12/2011  . Internal hemorrhoids without mention of complication 69/45/0388  . Pre-syncope 07/25/2009  . DJD (degenerative joint disease) of cervical spine 02/17/2009    Willow Ora, PTA, [facility] [address] [place], [place] [address] [phone] 10/11/17, 9:15 PM   Name: Matthew Kline MRN: [id number] Date of Birth: October 07, 1960

## 2017-10-13 ENCOUNTER — Ambulatory Visit: Payer: Medicare HMO | Admitting: Physical Therapy

## 2017-10-13 ENCOUNTER — Encounter: Payer: Self-pay | Admitting: Physical Therapy

## 2017-10-13 DIAGNOSIS — M6281 Muscle weakness (generalized): Secondary | ICD-10-CM | POA: Diagnosis not present

## 2017-10-13 DIAGNOSIS — R2689 Other abnormalities of gait and mobility: Secondary | ICD-10-CM

## 2017-10-13 DIAGNOSIS — R293 Abnormal posture: Secondary | ICD-10-CM

## 2017-10-13 DIAGNOSIS — R2681 Unsteadiness on feet: Secondary | ICD-10-CM

## 2017-10-16 NOTE — Therapy (Signed)
[facility] [address] [place], [place], [address] Phone: [phone]   Fax:  [phone]  Physical Therapy Treatment  Patient Details  Name: Matthew Kline MRN: [id number] Date of Birth: [birth year] Referring Provider (PT): Meridee Score, MD   Encounter Date: 10/13/2017     10/13/17 2214  PT Visits / Re-Eval  Visit Number 22  Number of Visits 42  Date for PT Re-Evaluation 12/21/17  Authorization  Authorization Type [employer]  PT Time Calculation  PT Start Time 0850  PT Stop Time 0930  PT Time Calculation (min) 40 min  PT - End of Session  Equipment Utilized During Treatment Gait belt  Activity Tolerance Patient tolerated treatment well  Behavior During Therapy [facility] for tasks assessed/performed    Past Medical History:  Diagnosis Date  . Anemia, unspecified   . Anxiety   . Arthralgia 2010   polyarticular  . Arthritis    "back, knees" (01/10/2017)  . Cancer [facility])    "kidney area" (01/10/2017)  . CHF (congestive heart failure) ([place]) 07/25/2009   denies  . Chronic lower back pain   . Coronary artery disease   . Coughing    pt. reports that he has drainage from sinus infection  . Diabetic foot ulcer ([place])   . Diabetic neuropathy ([place])   . ESRD (end stage renal disease) on dialysis [facility])    started 12/2012; "MWF; [place] "  (01/10/2017)  . GERD (gastroesophageal reflux disease)    hx "before I lost weight", no problem 9 years  . Hemodialysis access site with mature fistula ([place])   . Hemorrhoids, internal 10/2011   small  . High cholesterol   . History of blood transfusion    "related to the anemia"  . Hypertension   . Insomnia, unspecified   . Lacunar infarction ([place]) 2006   RUE/RLE, speech  . Long term (current) use of anticoagulants   . Myocardial infarction ([place]) 1995  . Orthostatic hypotension   . Osteomyelitis of foot, left, acute ([place])   . Other chronic postoperative pain    . Pneumonia    "probably twice" (01/10/2017)  . Polymyalgia rheumatica ([place])   . Renal insufficiency   . Sleep apnea    "lost weight; no more problem" (01/10/2017)  . Stroke ([place]) 01/10/06   denies residual on 05/09/2014  . Type II diabetes mellitus ([place]) dx'd 1995  . Unspecified hereditary and idiopathic peripheral neuropathy    feet  . Unspecified osteomyelitis, site unspecified   . Unspecified vitamin D deficiency     Past Surgical History:  Procedure Laterality Date  . ABDOMINAL AORTOGRAM N/A 08/25/2016   Procedure: ABDOMINAL AORTOGRAM;  Surgeon: Wellington Hampshire, MD;  Location: [place] [facility];  Service: Cardiovascular;  Laterality: N/A;  . AMPUTATION  01/21/2012   Procedure: AMPUTATION RAY;  Surgeon: [name], MD;  Location: [place];  Service: Orthopedics;  Laterality: Left;  Left Foot 4th Ray Amputation  . AMPUTATION Left 05/04/2013   Procedure: AMPUTATION DIGIT;  Surgeon: [name], MD;  Location: [place];  Service: Orthopedics;  Laterality: Left;  Left Great Toe Amputation at [facility]  . AMPUTATION Left 01/14/2017   Procedure: AMPUTATION ABOVE LEFT KNEE;  Surgeon: [name], MD;  Location: [place];  Service: Orthopedics;  Laterality: Left;  . ANTERIOR CERVICAL DECOMP/DISCECTOMY FUSION  02/2011  . BACK SURGERY    . [place] TRANSPOSITION Left 10/19/2012   Procedure: BASCILIC VEIN TRANSPOSITION;  Surgeon: Durene Fruits  Pierre Bali, MD;  Location: [place];  Service: Vascular;  Laterality: Left;  . CARDIAC CATHETERIZATION     "before bypass"  . CORONARY ARTERY BYPASS GRAFT     x 5 with lima at [place] WITH ANTIBIOTIC SPACERS Left 08/07/2014   Procedure: Replace Left Total Knee Arthroplasty,  Place Antibiotic Spacer;  Surgeon: [name], MD;  Location: [facility];  Service: Orthopedics;  Laterality: Left;  . I&D EXTREMITY Left 05/09/2014   Procedure: Irrigation and Debridement Left Knee and Closure of Total Knee Arthroplasty Incision;   Surgeon: [name], MD;  Location: [facility];  Service: Orthopedics;  Laterality: Left;  . I&D KNEE WITH POLY EXCHANGE Left 05/31/2014   Procedure: IRRIGATION AND DEBRIDEMENT LEFT KNEE, PLACE ANTIBIOTIC BEADS,  POLY EXCHANGE;  Surgeon: [name], MD;  Location: [place] [place];  Service: Orthopedics;  Laterality: Left;  . IRRIGATION AND DEBRIDEMENT KNEE Left 01/12/2017   Procedure: IRRIGATION AND DEBRIDEMENT LEFT KNEE;  Surgeon: [name], MD;  Location: [place];  Service: Orthopedics;  Laterality: Left;  . JOINT REPLACEMENT    . KNEE ARTHROSCOPY Left 08-25-2012  . LOWER EXTREMITY ANGIOGRAPHY Left 08/25/2016   Procedure: Lower Extremity Angiography;  Surgeon: Wellington Hampshire, MD;  Location: [place] [facility];  Service: Cardiovascular;  Laterality: Left;  . PERIPHERAL VASCULAR BALLOON ANGIOPLASTY Left 08/25/2016   Procedure: PERIPHERAL VASCULAR BALLOON ANGIOPLASTY;  Surgeon: Wellington Hampshire, MD;  Location: [place] [facility];  Service: Cardiovascular;  Laterality: Left;  lt peroneal and ant tibial arteries cutting balloon  . REFRACTIVE SURGERY Bilateral   . TOE AMPUTATION Bilateral    "I've lost 7 toes over the last 7 years" (05/09/2014)  . TOE SURGERY Left April 2015   Big toe removed on left foot.  . TONSILLECTOMY    . TOTAL KNEE ARTHROPLASTY Left 04/10/2014   Procedure: TOTAL KNEE ARTHROPLASTY;  Surgeon: [name], MD;  Location: [place];  Service: Orthopedics;  Laterality: Left;  . TOTAL KNEE REVISION Left 10/25/2014   Procedure: LEFT TOTAL KNEE REVISION;  Surgeon: [name], MD;  Location: [place];  Service: Orthopedics;  Laterality: Left;  . TOTAL KNEE REVISION Left 11/26/2015   Procedure: Removal Left Total Knee Arthroplasty, Hinged Total Knee Arthroplasty;  Surgeon: [name], MD;  Location: [facility];  Service: Orthopedics;  Laterality: Left;  . UVULOPALATOPHARYNGOPLASTY, TONSILLECTOMY AND SEPTOPLASTY  ~ 1989  . WOUND DEBRIDEMENT Left 05/09/2014   Dehiscence Left Total Knee  Arthroplasty Incision    There were no vitals filed for this visit.     10/13/17 0850  Symptoms/Limitations  Subjective No falls. Prosthesis fitting better with dialysis stabilizing more now.   Pertinent History Left TFA, ESRD with dialysis M,W,F, CHF, CAD s/p CABG 05/2016, PAD, CVA, DM2, neuropathy, arthritis, right toe amputations  Limitations Lifting;Standing;Walking;House hold activities  Patient Stated Goals He wants to use prosthesis to walk without assistance, garden, cook / work in [employer]   Pain Assessment  Currently in Pain? No/denies      10/13/17 0850  Transfers  Transfers Sit to Stand;Stand to Sit  Sit to Stand 5: Supervision;With upper extremity assist;With armrests;From chair/3-in-1  Stand to Sit 5: Supervision;With upper extremity assist;With armrests;To chair/3-in-1  Ambulation/Gait  Ambulation/Gait Yes  Ambulation/Gait Assistance 4: Min guard;4: Min assist  Ambulation/Gait Assistance Details tactile & verbal cues on weight shift onto prosthesis in stance & prosthetic knee flexion in terminal stance  Ambulation Distance (Feet) 120  Feet (120' X 2)  Assistive device Prosthesis;Straight cane  Gait Pattern Decreased step length - right;Decreased stance time - left;Decreased stride length;Decreased hip/knee flexion - left;Decreased weight shift to left;Left circumduction;Left hip hike;Antalgic;Lateral hip instability;Trunk flexed;Decreased trunk rotation;Abducted - left;Poor foot clearance - left;Step-through pattern  Ambulation Surface Indoor;Level  Stairs Yes  Stairs Assistance 4: Min guard;5: Supervision  Stairs Assistance Details (indicate cue type and reason) PT demo, instructed in using prsothetic hydraulics to descend with step-to pattern.  Stair Management Technique Two rails;Step to pattern;Forwards  Number of Stairs 4 (3 reps)  Ramp 3: Mod assist (cane & prosthesis)  Ramp Details (indicate cue type and reason) cues on posture & wt shift  Curb 3: Mod  assist (cane & prosthesis )  Curb Details (indicate cue type and reason) cues on technique with cane  Knee/Hip Exercises: Aerobic  Tread Mill PT demo & instructed in getting on/off and stepping on/off belt, use of safety lanyard and gait speed with BUE support working on decreasing amount of lifting;  pt ambulated initially 0.52mph progressing to 0.29mph for 5 min total. Pt return demo understanding.   Knee/Hip Exercises: Machines for Strengthening  Total Gym Leg Press BLE leg press using prosthesis 100# 15 reps.  PT instructed in weight lifting technique with prosthesis.   Prosthetics  Prosthetic Care Comments  Has not called to set appt to see [name] for app and prosthesis adjustment. He has gone to [employer] (downtown) and checked out equipment.   Current prosthetic wear tolerance (days/week)  daily  Current prosthetic wear tolerance (#hours/day)  reports all awake hours with a break somedays after dialysis for napping  Residual limb condition  no issues per pt report        10/13/17 0920  PT Education  Education Details use of treadmill including getting on/off with prosthesis and weight machines with symmetrical posture recommend starting with 1-2 LE & 1-2 UE then progressing as tolerated.   Person(s) Educated Patient  Methods Explanation;Demonstration;Verbal cues  Comprehension Verbalized understanding;Returned demonstration;Verbal cues required;Need further instruction;Tactile cues required                    10/13/17 2228  Plan  Clinical Impression Statement Patient appears to understand use of Treadmill & weight machines with prostheis. He joined [employer] with plans to start attending. Patient is on target to meet STGs next week.   Pt will benefit from skilled therapeutic intervention in order to improve on the following deficits Decreased activity tolerance;Abnormal gait;Decreased balance;Decreased endurance;Decreased knowledge of use of DME;Decreased mobility;Decreased  strength;Prosthetic Dependency;Postural dysfunction;Dizziness  Rehab Potential Good  PT Frequency 2x / week  PT Duration 12 weeks  PT Treatment/Interventions ADLs/Self Care Home Management;DME Instruction;Gait training;Stair training;Functional mobility training;Therapeutic activities;Therapeutic exercise;Balance training;Neuromuscular re-education;Canalith Repostioning;Patient/family education;Prosthetic Training;Vestibular  PT Next Visit Plan assess STGs, check if has gone to [facility], prosthetic gait with cane, balance activities  Consulted and Agree with Plan of Care Patient            PT Short Term Goals - 10/06/17 1800      PT SHORT TERM GOAL #1   Title  Patient ambulates 100' with cane RUE & wall support LUE with supervision / cues (All updated STGs Target Date: 10/21/2017)    Time  4    Period  Weeks    Status  New    Target Date  10/21/17      PT SHORT TERM GOAL #2   Title  Patient ambulates 300' with cane &  hand hold assist engaging prosthesis.     Time  4    Period  Weeks    Status  New    Target Date  10/21/17      PT SHORT TERM GOAL #3   Title  Patient negotiates ramps & curbs with cane & moderate hand hold assist.    Time  4    Period  Weeks    Status  New    Target Date  10/21/17        PT Long Term Goals - 10/06/17 1800      PT LONG TERM GOAL #1   Title  Patient verbalizes proper prosthetic care including problem solving issues and when to contact prosthetist. (All updated LTGs Target Date: 12/21/2017)    Time  3    Period  Months    Status  On-going    Target Date  12/21/17      PT LONG TERM GOAL #2   Title  Patient tolerates prosthesis wear >90% of awake hours without skin or limb pain issues to enable function throughout his day.     Time  3    Period  Months    Status  On-going    Target Date  12/21/17      PT LONG TERM GOAL #3   Title  Berg Balance with device >45/56 to indicate lower fall risk.     Time  3    Period  Months    Status   Revised    Target Date  12/21/17      PT LONG TERM GOAL #4   Title  Patient ambulates 1000' outdoors including grass, ramps & curbs with cane or less and prosthesis modified independent.     Time  3    Period  Months    Status  On-going    Target Date  12/21/17      PT LONG TERM GOAL #5   Title  Patient descends stairs with alternate pattern with single rail & cane and negotiates stairs without rails with cane step-to pattern modified independent.     Time  3    Period  Months    Status  Revised    Target Date  12/21/17      PT LONG TERM GOAL #6   Title  Patient ambulates around furniture with prosthesis only carrying light weight household items modified independent.     Time  3    Period  Months    Status  New    Target Date  12/21/17              Patient will benefit from skilled therapeutic intervention in order to improve the following deficits and impairments:     Visit Diagnosis: Muscle weakness (generalized)  Other abnormalities of gait and mobility  Abnormal posture  Unsteadiness on feet     Problem List Patient Active Problem List   Diagnosis Date Noted  . Altered mental status   . Cerebral thrombosis with cerebral infarction 02/06/2017  . Cerebral embolism with cerebral infarction 02/06/2017  . Above knee amputation status, left   . Pressure injury of skin 01/30/2017  . Acute lower UTI 01/29/2017  . Acute metabolic encephalopathy 82/99/3716  . UTI (urinary tract infection) 01/29/2017  . Quadriceps muscle rupture, left, initial encounter   . Fall 01/09/2017  . Gait disturbance 01/09/2017  . Staphylococcus aureus infection 01/09/2017  . Infection of prosthetic left knee joint ([place]) 01/09/2017  . Fever, unknown  origin 11/09/2016  . Critical lower limb ischemia 08/25/2016  . Ulcer of left midfoot with fat layer exposed ([place]) 08/13/2016  . Diabetic ulcer of left midfoot associated with type 2 diabetes mellitus, with fat layer exposed ([place])  07/25/2016  . Peripheral neuropathy 07/22/2016  . Tobacco abuse 07/22/2016  . CAD in native artery 05/18/2016  . CAD, multiple vessel 05/11/2016  . Positive cardiac stress test 05/11/2016  . Abnormal stress test 04/30/2016  . Pre-transplant evaluation for kidney transplant 04/30/2016  . S/P revision of total knee 11/26/2015  . Pain in the chest   . Acute on chronic diastolic heart failure ([place]) 07/05/2015  . Volume overload 07/04/2015  . Shortness of breath 07/04/2015  . Hypoxemia 07/04/2015  . Elevated troponin   . End-stage renal disease on hemodialysis ([place])   . Hypervolemia   . 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 Pyogenic bacterial arthritis of knee, left ([place]) 08/07/2014  . Tachycardia 07/24/2014  . Acute upper respiratory infection 07/24/2014  . ESRD on dialysis ([place]) 07/14/2014  . Type II diabetes mellitus ([place]) 07/14/2014  . Anemia in chronic kidney disease 07/14/2014  . Congestive heart failure (CHF) ([place]) 07/13/2014  . Surgical wound dehiscence 05/09/2014  . Dehiscence of closure of skin 05/09/2014  . Total knee replacement status 04/10/2014  . Diabetes mellitus with renal manifestations, controlled ([place]) 10/24/2013  . Hypertensive renal disease 06/27/2013  . DM type 2 causing vascular disease ([place]) 06/27/2013  . Erectile dysfunction 06/27/2013  . Depression 06/27/2013  . Claudication of left lower extremity ([place]) 12/19/2012  . Essential hypertension, benign 12/19/2012  . Sinusitis, acute maxillary 11/22/2012  . Otitis, externa, infective 11/14/2012  . Leg edema, left 11/14/2012  . End stage renal disease ([place]) 10/02/2012  . Controlled type 2 DM with proteinuria or microalbuminuria 09/19/2012  . GERD (gastroesophageal reflux disease) 09/19/2012  . Leukocytosis 09/19/2012  . Lacunar infarction ([place]) 08/17/2012  . Polymyalgia rheumatica ([place]) 08/17/2012  . 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 Essential hypertension 05/10/2012  . Vitamin D  deficiency 05/10/2012  . Diabetes mellitus due to underlying condition ([place]) 05/10/2012  . Hyperlipidemia LDL goal <100 05/10/2012  . Anemia of chronic disease 05/10/2012  . Screening for prostate cancer 05/10/2012  . Chronic kidney disease (CKD), stage IV (severe) ([place]) 05/10/2012  . Peripheral autonomic neuropathy due to DM ([place]) 05/10/2012  . Callus of foot 05/10/2012  . Urgency of urination 05/10/2012  . Hyperkalemia 05/10/2012  . Candidiasis of the esophagus 10/12/2011  . Internal hemorrhoids without mention of complication 23/53/6144  . Pre-syncope 07/25/2009  . DJD (degenerative joint disease) of cervical spine 02/17/2009    Jamey Reas PT, DPT 10/16/2017, 10:13 PM  [facility] [address] [place], [place], [address] Phone: [phone]   Fax:  [phone]  Name: Ananth Fiallos MRN: [id number] Date of Birth: 1960-10-18

## 2017-10-18 ENCOUNTER — Encounter (INDEPENDENT_AMBULATORY_CARE_PROVIDER_SITE_OTHER): Payer: Self-pay | Admitting: Physician Assistant

## 2017-10-18 ENCOUNTER — Ambulatory Visit (INDEPENDENT_AMBULATORY_CARE_PROVIDER_SITE_OTHER): Payer: Medicare HMO

## 2017-10-18 ENCOUNTER — Encounter: Payer: Self-pay | Admitting: Physical Therapy

## 2017-10-18 ENCOUNTER — Ambulatory Visit (INDEPENDENT_AMBULATORY_CARE_PROVIDER_SITE_OTHER): Payer: Medicare HMO | Admitting: Physician Assistant

## 2017-10-18 ENCOUNTER — Ambulatory Visit: Payer: Medicare HMO | Admitting: Physical Therapy

## 2017-10-18 VITALS — Ht 71.0 in | Wt 148.0 lb

## 2017-10-18 DIAGNOSIS — E1129 Type 2 diabetes mellitus with other diabetic kidney complication: Secondary | ICD-10-CM | POA: Diagnosis not present

## 2017-10-18 DIAGNOSIS — M79671 Pain in right foot: Secondary | ICD-10-CM

## 2017-10-18 DIAGNOSIS — N186 End stage renal disease: Secondary | ICD-10-CM | POA: Diagnosis not present

## 2017-10-18 DIAGNOSIS — R2689 Other abnormalities of gait and mobility: Secondary | ICD-10-CM

## 2017-10-18 DIAGNOSIS — R293 Abnormal posture: Secondary | ICD-10-CM

## 2017-10-18 DIAGNOSIS — E1143 Type 2 diabetes mellitus with diabetic autonomic (poly)neuropathy: Secondary | ICD-10-CM | POA: Diagnosis not present

## 2017-10-18 DIAGNOSIS — M6281 Muscle weakness (generalized): Secondary | ICD-10-CM | POA: Diagnosis not present

## 2017-10-18 DIAGNOSIS — R2681 Unsteadiness on feet: Secondary | ICD-10-CM

## 2017-10-18 DIAGNOSIS — Z992 Dependence on renal dialysis: Secondary | ICD-10-CM

## 2017-10-18 MED ORDER — PENTOXIFYLLINE ER 400 MG PO TBCR: 400.0000 mg | EXTENDED_RELEASE_TABLET | Freq: Three times a day (TID) | ORAL | 1 refills | Status: DC

## 2017-10-18 NOTE — Progress Notes (Signed)
Office Visit Note   Patient: Matthew Kline           Date of Birth: 10-03-1960           MRN: [id number] Visit Date: 10/18/2017              Requested by: Sandi Mariscal, [place], [place] [id number] PCP: Patient, No Pcp Per  Chief Complaint  Patient presents with  . Right Foot - Pain      HPI: The patient is a 57 year old male who is seen for concerns over his right Charcot foot.  He is status post left above-the-knee amputation and has been doing well with this and participating with physical therapy for progressive ambulation.  He reports that he has noticed increased area of swelling or a bump over his midfoot under his usual callused area of the plantar surface of his midfoot on the right.  He felt like something happened on the weekend and he noticed more of a bump than usual.  He is status post previous amputation of the right great toe.  Assessment & Plan: Visit Diagnoses:  1. Right foot pain   2. ESRD on dialysis [facility])     Plan: Counseled patient there is no evidence for worsening Charcot collapse or other acute abnormality.  We are going to start Trental 400 mg p.o. 3 times daily for his severe calcification of his vasculature in the right foot.  He is going to discuss with his nephrologist as to whether or not this dose needs to be titrated for his end-stage renal disease.  He will follow-up in about 3 weeks or sooner should he have any further difficulties in the interim.  Follow-Up Instructions: Return in about 3 weeks (around 11/08/2017).   Ortho Exam  Patient is alert, oriented, no adenopathy, well-dressed, normal affect, normal respiratory effort. Examination of the right foot shows well-healed old right great toe amputation.  He does have hammertoe and angular deformities of the remaining toes and prominence of the metatarsal heads over the midfoot with callus over the second third great toe but no ulceration.  No cellulitis.  The pedal pulses are  biphasic by Doppler.  Imaging: No results found. No images are attached to the encounter.  Labs: Lab Results  Component Value Date   HGBA1C 5.7 (H) 02/07/2017   HGBA1C 6.4 (H) 11/08/2016   HGBA1C 6.6 (H) 11/24/2015   ESRSEDRATE 80 (H) 11/29/2015   ESRSEDRATE 75 (H) 08/07/2014   ESRSEDRATE >140 (H) 06/03/2014   CRP 9.8 (H) 07/14/2016   CRP 20.4 (H) 11/29/2015   CRP 5.6 (H) 08/07/2014   REPTSTATUS 02/04/2017 FINAL 01/30/2017   GRAMSTAIN  01/12/2017    RARE WBC PRESENT, PREDOMINANTLY MONONUCLEAR FEW GRAM POSITIVE COCCI IN PAIRS    CULT NO GROWTH 5 DAYS 01/30/2017   LABORGA STAPHYLOCOCCUS AUREUS 01/12/2017     Lab Results  Component Value Date   ALBUMIN 3.5 04/04/2017   ALBUMIN 2.4 (L) 02/07/2017   ALBUMIN 2.6 (L) 02/04/2017    Body mass index is 20.64 kg/m.  Orders:  Orders Placed This Encounter  Procedures  . XR Foot 2 Views Right   Meds ordered this encounter  Medications  . pentoxifylline (TRENTAL) 400 MG CR tablet    Sig: Take 1 tablet (400 mg total) by mouth 3 (three) times daily with meals.    Dispense:  90 tablet    Refill:  1     Procedures: No procedures performed  Clinical Data: No  additional findings.  ROS:  All other systems negative, except as noted in the HPI. Review of Systems  Objective: Vital Signs: Ht 5\' 11"  (1.803 m)   Wt 148 lb (67.1 kg)   BMI 20.64 kg/m   Specialty Comments:  No specialty comments available.  PMFS History: Patient Active Problem List   Diagnosis Date Noted  . Altered mental status   . Cerebral thrombosis with cerebral infarction 02/06/2017  . Cerebral embolism with cerebral infarction 02/06/2017  . Above knee amputation status, left   . Pressure injury of skin 01/30/2017  . Acute lower UTI 01/29/2017  . Acute metabolic encephalopathy 95/62/1308  . UTI (urinary tract infection) 01/29/2017  . Quadriceps muscle rupture, left, initial encounter   . Fall 01/09/2017  . Gait disturbance 01/09/2017  .  Staphylococcus aureus infection 01/09/2017  . Infection of prosthetic left knee joint ([place]) 01/09/2017  . Fever, unknown origin 11/09/2016  . Critical lower limb ischemia 08/25/2016  . Ulcer of left midfoot with fat layer exposed ([place]) 08/13/2016  . Diabetic ulcer of left midfoot associated with type 2 diabetes mellitus, with fat layer exposed ([place]) 07/25/2016  . Peripheral neuropathy 07/22/2016  . Tobacco abuse 07/22/2016  . CAD in native artery 05/18/2016  . CAD, multiple vessel 05/11/2016  . Positive cardiac stress test 05/11/2016  . Abnormal stress test 04/30/2016  . Pre-transplant evaluation for kidney transplant 04/30/2016  . S/P revision of total knee 11/26/2015  . Pain in the chest   . Acute on chronic diastolic heart failure ([place]) 07/05/2015  . Volume overload 07/04/2015  . Shortness of breath 07/04/2015  . Hypoxemia 07/04/2015  . Elevated troponin   . End-stage renal disease on hemodialysis ([place])   . Hypervolemia   . 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 Pyogenic bacterial arthritis of knee, left ([place]) 08/07/2014  . Tachycardia 07/24/2014  . Acute upper respiratory infection 07/24/2014  . ESRD on dialysis ([place]) 07/14/2014  . Type II diabetes mellitus ([place]) 07/14/2014  . Anemia in chronic kidney disease 07/14/2014  . Congestive heart failure (CHF) ([place]) 07/13/2014  . Surgical wound dehiscence 05/09/2014  . Dehiscence of closure of skin 05/09/2014  . Total knee replacement status 04/10/2014  . Diabetes mellitus with renal manifestations, controlled ([place]) 10/24/2013  . Hypertensive renal disease 06/27/2013  . DM type 2 causing vascular disease ([place]) 06/27/2013  . Erectile dysfunction 06/27/2013  . Depression 06/27/2013  . Claudication of left lower extremity ([place]) 12/19/2012  . Essential hypertension, benign 12/19/2012  . Sinusitis, acute maxillary 11/22/2012  . Otitis, externa, infective 11/14/2012  . Leg edema, left 11/14/2012  . End stage renal  disease ([place]) 10/02/2012  . Controlled type 2 DM with proteinuria or microalbuminuria 09/19/2012  . GERD (gastroesophageal reflux disease) 09/19/2012  . Leukocytosis 09/19/2012  . Lacunar infarction ([place]) 08/17/2012  . Polymyalgia rheumatica ([place]) 08/17/2012  . 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 Essential hypertension 05/10/2012  . Vitamin D deficiency 05/10/2012  . Diabetes mellitus due to underlying condition ([place]) 05/10/2012  . Hyperlipidemia LDL goal <100 05/10/2012  . Anemia of chronic disease 05/10/2012  . Screening for prostate cancer 05/10/2012  . Chronic kidney disease (CKD), stage IV (severe) ([place]) 05/10/2012  . Peripheral autonomic neuropathy due to DM ([place]) 05/10/2012  . Callus of foot 05/10/2012  . Urgency of urination 05/10/2012  . Hyperkalemia 05/10/2012  . Candidiasis of the esophagus 10/12/2011  . Internal hemorrhoids without mention of complication 65/78/4696  . Pre-syncope 07/25/2009  . DJD (degenerative joint disease)  of cervical spine 02/17/2009   Past Medical History:  Diagnosis Date  . Anemia, unspecified   . Anxiety   . Arthralgia 2010   polyarticular  . Arthritis    "back, knees" (01/10/2017)  . Cancer [facility])    "kidney area" (01/10/2017)  . CHF (congestive heart failure) ([place]) 07/25/2009   denies  . Chronic lower back pain   . Coronary artery disease   . Coughing    pt. reports that he has drainage from sinus infection  . Diabetic foot ulcer ([place])   . Diabetic neuropathy ([place])   . ESRD (end stage renal disease) on dialysis [facility])    started 12/2012; "MWF; [place] [facility] "  (01/10/2017)  . GERD (gastroesophageal reflux disease)    hx "before I lost weight", no problem 9 years  . Hemodialysis access site with mature fistula ([place])   . Hemorrhoids, internal 10/2011   small  . High cholesterol   . History of blood transfusion    "related to the anemia"  . Hypertension   . Insomnia, unspecified   . Lacunar infarction ([place]) 2006   RUE/RLE,  speech  . Long term (current) use of anticoagulants   . Myocardial infarction ([place]) 1995  . Orthostatic hypotension   . Osteomyelitis of foot, left, acute ([place])   . Other chronic postoperative pain   . Pneumonia    "probably twice" (01/10/2017)  . Polymyalgia rheumatica ([place])   . Renal insufficiency   . Sleep apnea    "lost weight; no more problem" (01/10/2017)  . Stroke ([place]) 01/10/06   denies residual on 05/09/2014  . Type II diabetes mellitus ([place]) dx'd 1995  . Unspecified hereditary and idiopathic peripheral neuropathy    feet  . Unspecified osteomyelitis, site unspecified   . Unspecified vitamin D deficiency     Family History  Problem Relation Age of Onset  . Hypertension Mother   . Cancer Mother 46       Ovarian  . Heart disease Maternal Aunt   . Stroke Maternal Grandfather     Past Surgical History:  Procedure Laterality Date  . ABDOMINAL AORTOGRAM N/A 08/25/2016   Procedure: ABDOMINAL AORTOGRAM;  Surgeon: Wellington Hampshire, MD;  Location: [place] [facility];  Service: Cardiovascular;  Laterality: N/A;  . AMPUTATION  01/21/2012   Procedure: AMPUTATION RAY;  Surgeon: [name], MD;  Location: [place];  Service: Orthopedics;  Laterality: Left;  Left Foot 4th Ray Amputation  . AMPUTATION Left 05/04/2013   Procedure: AMPUTATION DIGIT;  Surgeon: [name], MD;  Location: [place];  Service: Orthopedics;  Laterality: Left;  Left Great Toe Amputation at MTP  . AMPUTATION Left 01/14/2017   Procedure: AMPUTATION ABOVE LEFT KNEE;  Surgeon: [name], MD;  Location: [place];  Service: Orthopedics;  Laterality: Left;  . ANTERIOR CERVICAL DECOMP/DISCECTOMY FUSION  02/2011  . BACK SURGERY    . BASCILIC VEIN TRANSPOSITION Left 10/19/2012   Procedure: BASCILIC VEIN TRANSPOSITION;  Surgeon: Serafina Mitchell, MD;  Location: [place];  Service: Vascular;  Laterality: Left;  . CARDIAC CATHETERIZATION     "before bypass"  . CORONARY ARTERY BYPASS GRAFT     x 5 with lima at [place] WITH ANTIBIOTIC SPACERS Left 08/07/2014   Procedure: Replace Left Total Knee Arthroplasty,  Place Antibiotic Spacer;  Surgeon: [name], MD;  Location: [place];  Service: Orthopedics;  Laterality: Left;  . I&D EXTREMITY Left 05/09/2014  Procedure: Irrigation and Debridement Left Knee and Closure of Total Knee Arthroplasty Incision;  Surgeon: [name], MD;  Location: [place];  Service: Orthopedics;  Laterality: Left;  . I&D KNEE WITH POLY EXCHANGE Left 05/31/2014   Procedure: IRRIGATION AND DEBRIDEMENT LEFT KNEE, PLACE ANTIBIOTIC BEADS,  POLY EXCHANGE;  Surgeon: [name], MD;  Location: [place];  Service: Orthopedics;  Laterality: Left;  . IRRIGATION AND DEBRIDEMENT KNEE Left 01/12/2017   Procedure: IRRIGATION AND DEBRIDEMENT LEFT KNEE;  Surgeon: [name], MD;  Location: [place];  Service: Orthopedics;  Laterality: Left;  . JOINT REPLACEMENT    . KNEE ARTHROSCOPY Left 08-25-2012  . LOWER EXTREMITY ANGIOGRAPHY Left 08/25/2016   Procedure: Lower Extremity Angiography;  Surgeon: Wellington Hampshire, MD;  Location: [place] [facility];  Service: Cardiovascular;  Laterality: Left;  . PERIPHERAL VASCULAR BALLOON ANGIOPLASTY Left 08/25/2016   Procedure: PERIPHERAL VASCULAR BALLOON ANGIOPLASTY;  Surgeon: Wellington Hampshire, MD;  Location: [place] [facility];  Service: Cardiovascular;  Laterality: Left;  lt peroneal and ant tibial arteries cutting balloon  . REFRACTIVE SURGERY Bilateral   . TOE AMPUTATION Bilateral    "I've lost 7 toes over the last 7 years" (05/09/2014)  . TOE SURGERY Left April 2015   Big toe removed on left foot.  . TONSILLECTOMY    . TOTAL KNEE ARTHROPLASTY Left 04/10/2014   Procedure: TOTAL KNEE ARTHROPLASTY;  Surgeon: [name], MD;  Location: [place];  Service: Orthopedics;  Laterality: Left;  . TOTAL KNEE REVISION Left 10/25/2014   Procedure: LEFT TOTAL KNEE REVISION;  Surgeon: [name], MD;  Location: [place];  Service: Orthopedics;   Laterality: Left;  . TOTAL KNEE REVISION Left 11/26/2015   Procedure: Removal Left Total Knee Arthroplasty, Hinged Total Knee Arthroplasty;  Surgeon: [name], MD;  Location: [place];  Service: Orthopedics;  Laterality: Left;  . UVULOPALATOPHARYNGOPLASTY, TONSILLECTOMY AND SEPTOPLASTY  ~ 1989  . WOUND DEBRIDEMENT Left 05/09/2014   Dehiscence Left Total Knee Arthroplasty Incision   Social History   Occupational History  . Not on file  Tobacco Use  . Smoking status: Former Smoker    Packs/day: 0.12    Years: 32.00    Pack years: 3.84    Types: Cigarettes    Last attempt to quit: 10/05/2016    Years since quitting: 1.0  . Smokeless tobacco: Never Used  Substance and Sexual Activity  . Alcohol use: No    Alcohol/week: 0.0 standard drinks  . Drug use: No  . Sexual activity: Not Currently

## 2017-10-19 NOTE — Therapy (Addendum)
[facility] [address] [place], [place], [address] Phone: [phone]   Fax:  [phone]  Physical Therapy Treatment  Patient Details  Name: Matthew Kline MRN: [id number] Date of Birth: 07/21/1960 Referring Provider (PT): Meridee Score, MD   Encounter Date: 10/18/2017  PT End of Session - 10/18/17 0854    Visit Number  23    Number of Visits  42    Date for PT Re-Evaluation  12/21/17    Authorization Type  [employer]    PT Start Time  0848    PT Stop Time  0930    PT Time Calculation (min)  42 min    Equipment Utilized During Treatment  Gait belt    Activity Tolerance  Patient tolerated treatment well;No increased pain    Behavior During Therapy  WFL for tasks assessed/performed       Past Medical History:  Diagnosis Date  . Anemia, unspecified   . Anxiety   . Arthralgia 2010   polyarticular  . Arthritis    "back, knees" (01/10/2017)  . Cancer [facility])    "kidney area" (01/10/2017)  . CHF (congestive heart failure) ([place]) 07/25/2009   denies  . Chronic lower back pain   . Coronary artery disease   . Coughing    pt. reports that he has drainage from sinus infection  . Diabetic foot ulcer ([place])   . Diabetic neuropathy ([place])   . ESRD (end stage renal disease) on dialysis [facility])    started 12/2012; "MWF; [place] "  (01/10/2017)  . GERD (gastroesophageal reflux disease)    hx "before I lost weight", no problem 9 years  . Hemodialysis access site with mature fistula ([place])   . Hemorrhoids, internal 10/2011   small  . High cholesterol   . History of blood transfusion    "related to the anemia"  . Hypertension   . Insomnia, unspecified   . Lacunar infarction ([place]) 2006   RUE/RLE, speech  . Long term (current) use of anticoagulants   . Myocardial infarction ([place]) 1995  . Orthostatic hypotension   . Osteomyelitis of foot, left, acute ([place])   . Other chronic postoperative pain   . Pneumonia     "probably twice" (01/10/2017)  . Polymyalgia rheumatica ([place])   . Renal insufficiency   . Sleep apnea    "lost weight; no more problem" (01/10/2017)  . Stroke ([place]) 01/10/06   denies residual on 05/09/2014  . Type II diabetes mellitus ([place]) dx'd 1995  . Unspecified hereditary and idiopathic peripheral neuropathy    feet  . Unspecified osteomyelitis, site unspecified   . Unspecified vitamin D deficiency     Past Surgical History:  Procedure Laterality Date  . ABDOMINAL AORTOGRAM N/A 08/25/2016   Procedure: ABDOMINAL AORTOGRAM;  Surgeon: Wellington Hampshire, MD;  Location: [place] [facility];  Service: Cardiovascular;  Laterality: N/A;  . AMPUTATION  01/21/2012   Procedure: AMPUTATION RAY;  Surgeon: [name], MD;  Location: [place];  Service: Orthopedics;  Laterality: Left;  Left Foot 4th Ray Amputation  . AMPUTATION Left 05/04/2013   Procedure: AMPUTATION DIGIT;  Surgeon: [name], MD;  Location: [place];  Service: Orthopedics;  Laterality: Left;  Left Great Toe Amputation at [facility]  . AMPUTATION Left 01/14/2017   Procedure: AMPUTATION ABOVE LEFT KNEE;  Surgeon: [name], MD;  Location: [place];  Service: Orthopedics;  Laterality: Left;  . ANTERIOR CERVICAL DECOMP/DISCECTOMY FUSION  02/2011  .  BACK SURGERY    . BASCILIC VEIN TRANSPOSITION Left 10/19/2012   Procedure: BASCILIC VEIN TRANSPOSITION;  Surgeon: Serafina Mitchell, MD;  Location: [place];  Service: Vascular;  Laterality: Left;  . CARDIAC CATHETERIZATION     "before bypass"  . CORONARY ARTERY BYPASS GRAFT     x 5 with lima at [place] WITH ANTIBIOTIC SPACERS Left 08/07/2014   Procedure: Replace Left Total Knee Arthroplasty,  Place Antibiotic Spacer;  Surgeon: [name], MD;  Location: [place];  Service: Orthopedics;  Laterality: Left;  . I&D EXTREMITY Left 05/09/2014   Procedure: Irrigation and Debridement Left Knee and Closure of Total Knee Arthroplasty Incision;  Surgeon: [name], MD;  Location: [place];  Service: Orthopedics;  Laterality: Left;  . I&D KNEE WITH POLY EXCHANGE Left 05/31/2014   Procedure: IRRIGATION AND DEBRIDEMENT LEFT KNEE, PLACE ANTIBIOTIC BEADS,  POLY EXCHANGE;  Surgeon: [name], MD;  Location: [place];  Service: Orthopedics;  Laterality: Left;  . IRRIGATION AND DEBRIDEMENT KNEE Left 01/12/2017   Procedure: IRRIGATION AND DEBRIDEMENT LEFT KNEE;  Surgeon: [name], MD;  Location: [place];  Service: Orthopedics;  Laterality: Left;  . JOINT REPLACEMENT    . KNEE ARTHROSCOPY Left 08-25-2012  . LOWER EXTREMITY ANGIOGRAPHY Left 08/25/2016   Procedure: Lower Extremity Angiography;  Surgeon: Wellington Hampshire, MD;  Location: [place] [facility];  Service: Cardiovascular;  Laterality: Left;  . PERIPHERAL VASCULAR BALLOON ANGIOPLASTY Left 08/25/2016   Procedure: PERIPHERAL VASCULAR BALLOON ANGIOPLASTY;  Surgeon: Wellington Hampshire, MD;  Location: [place] [facility];  Service: Cardiovascular;  Laterality: Left;  lt peroneal and ant tibial arteries cutting balloon  . REFRACTIVE SURGERY Bilateral   . TOE AMPUTATION Bilateral    "I've lost 7 toes over the last 7 years" (05/09/2014)  . TOE SURGERY Left April 2015   Big toe removed on left foot.  . TONSILLECTOMY    . TOTAL KNEE ARTHROPLASTY Left 04/10/2014   Procedure: TOTAL KNEE ARTHROPLASTY;  Surgeon: [name], MD;  Location: [place];  Service: Orthopedics;  Laterality: Left;  . TOTAL KNEE REVISION Left 10/25/2014   Procedure: LEFT TOTAL KNEE REVISION;  Surgeon: [name], MD;  Location: [place];  Service: Orthopedics;  Laterality: Left;  . TOTAL KNEE REVISION Left 11/26/2015   Procedure: Removal Left Total Knee Arthroplasty, Hinged Total Knee Arthroplasty;  Surgeon: [name], MD;  Location: [place];  Service: Orthopedics;  Laterality: Left;  . UVULOPALATOPHARYNGOPLASTY, TONSILLECTOMY AND SEPTOPLASTY  ~ 1989  . WOUND DEBRIDEMENT Left 05/09/2014   Dehiscence Left Total Knee Arthroplasty Incision     There were no vitals filed for this visit.  Subjective Assessment - 10/18/17 0852    Subjective  Did not get to see Matthew Kline on Friday because his dialysis time got changed. Also reports that his [employer] foot had collasped more, will need a new custom diabetic shoe. Plans to discuss this with Matthew Kline at [place] when he rescedules for the adjusments to his prosthesis programming. Has been to the [facility] and used the weight machines and treadmil. Did not use stepper as he has not had the programming changed on his prosthesis as yet.     Pertinent History  Left TFA, ESRD with dialysis M,W,F, CHF, CAD s/p CABG 05/2016, PAD, CVA, DM2, neuropathy, arthritis, right toe amputations    Limitations  Lifting;Standing;Walking;House hold activities    Patient Stated Goals  He wants to use prosthesis  to walk without assistance, garden, cook / work in [employer]     Currently in Pain?  No/denies    Pain Score  0-No pain              [facility] Adult PT Treatment/Exercise - 10/18/17 0856      Transfers   Transfers  Sit to Stand;Stand to Sit    Sit to Stand  5: Supervision;With upper extremity assist;With armrests;From chair/3-in-1    Stand to Sit  5: Supervision;With upper extremity assist;With armrests;To chair/3-in-1      Ambulation/Gait   Ambulation/Gait  Yes    Ambulation/Gait Assistance  4: Min guard;4: Min assist    Ambulation/Gait Assistance Details  multimodal cues for posture, weight shifting and step length/step placement for 300 feet with HHA and >/= 100 feet with wall support for balance, all without a rest break between. Then 200 feet outdoors for barriers training; then continued with use of straight cane around gym with other activities.      Ambulation Distance (Feet)  403 Feet   x1, 200 x1 in/outdoors, plus around gym with activities   Assistive device  Prosthesis;Straight cane    Gait Pattern  Decreased step length - right;Decreased stance time - left;Decreased stride length;Decreased hip/knee  flexion - left;Decreased weight shift to left;Left circumduction;Left hip hike;Antalgic;Lateral hip instability;Trunk flexed;Decreased trunk rotation;Abducted - left;Poor foot clearance - left;Step-through pattern    Ambulation Surface  Level;Indoor    Ramp  4: Min assist;3: Mod assist    Ramp Details (indicate cue type and reason)  with prosthesis/cane on outdoor inclines/declines with min to mod assist for balance, cues on posture and step length.     Curb  4: Min assist    Curb Details (indicate cue type and reason)  with cane/prosthesis on outdoor curb with min HHA and reminder cues on sequencing.       High Level Balance   High Level Balance Activities  Side stepping;Backward walking    High Level Balance Comments  with prosthesis/cane next to counter top: side stepping left<>right x 3 laps each way, then bwd walking with cane/counter x 2 laps, then with cane only for 2 laps. min guard to min assist for balance. cues for incr foot clearance with side stepping and incr step length with backward gait.       Prosthetics   Current prosthetic wear tolerance (days/week)   daily    Current prosthetic wear tolerance (#hours/day)   reports all awake hours with a break somedays after dialysis for napping    Residual limb condition   no issues per pt report    Donning Prosthesis  Supervision    Doffing Prosthesis  Modified independent (device/increased time)          Balance Exercises - 10/18/17 0912      Balance Exercises: Standing   Balance Beam  standing across red beam: alternating UE raises, progressing to bil UE raises with pt in a wider stance; with narrowed stance- EC no head movements, progressing to EC head movements left<>right, up<>down and diagonals both ways. min guard to min assist for balance with cues on posture and weight shifting to assist with balance.                                     PT Short Term Goals - 10/18/17 0856      PT SHORT TERM GOAL #1  Title  Patient  ambulates 100' with cane RUE & wall support LUE with supervision / cues (All updated STGs Target Date: 10/21/2017)    Baseline  10/18/17: met today    Time  --    Period  --    Status  Achieved      PT SHORT TERM GOAL #2   Title  Patient ambulates 300' with cane & hand hold assist engaging prosthesis.     Baseline  10/18/17: met today     Time  --    Period  --    Status  Achieved      PT SHORT TERM GOAL #3   Title  Patient negotiates ramps & curbs with cane & moderate hand hold assist.    Baseline  10/18/17: met today    Time  --    Period  --    Status  Achieved        PT Long Term Goals - 10/06/17 1800      PT LONG TERM GOAL #1   Title  Patient verbalizes proper prosthetic care including problem solving issues and when to contact prosthetist. (All updated LTGs Target Date: 12/21/2017)    Time  3    Period  Months    Status  On-going    Target Date  12/21/17      PT LONG TERM GOAL #2   Title  Patient tolerates prosthesis wear >90% of awake hours without skin or limb pain issues to enable function throughout his day.     Time  3    Period  Months    Status  On-going    Target Date  12/21/17      PT LONG TERM GOAL #3   Title  Berg Balance with device >45/56 to indicate lower fall risk.     Time  3    Period  Months    Status  Revised    Target Date  12/21/17      PT LONG TERM GOAL #4   Title  Patient ambulates 1000' outdoors including grass, ramps & curbs with cane or less and prosthesis modified independent.     Time  3    Period  Months    Status  On-going    Target Date  12/21/17      PT LONG TERM GOAL #5   Title  Patient descends stairs with alternate pattern with single rail & cane and negotiates stairs without rails with cane step-to pattern modified independent.     Time  3    Period  Months    Status  Revised    Target Date  12/21/17      PT LONG TERM GOAL #6   Title  Patient ambulates around furniture with prosthesis only carrying light weight  household items modified independent.     Time  3    Period  Months    Status  New    Target Date  12/21/17            Plan - 10/18/17 0854    Clinical Impression Statement  Today's skilled session initially focused on progress toward STGs with all goals met. Remainder of session continued to address balance with straight cane and with decreasd UE support. The pt is progressing toward goals and should benefit from continued PT to progress toward unmet goals.     Rehab Potential  Good    PT Frequency  2x / week    PT Duration  12 weeks    PT Treatment/Interventions  ADLs/Self Care Home Management;DME Instruction;Gait training;Stair training;Functional mobility training;Therapeutic activities;Therapeutic exercise;Balance training;Neuromuscular re-education;Canalith Repostioning;Patient/family education;Prosthetic Training;Vestibular    PT Next Visit Plan   prosthetic gait with cane, balance activities    Consulted and Agree with Plan of Care  Patient       Patient will benefit from skilled therapeutic intervention in order to improve the following deficits and impairments:  Decreased activity tolerance, Abnormal gait, Decreased balance, Decreased endurance, Decreased knowledge of use of DME, Decreased mobility, Decreased strength, Prosthetic Dependency, Postural dysfunction, Dizziness  Visit Diagnosis: Muscle weakness (generalized)  Other abnormalities of gait and mobility  Abnormal posture  Unsteadiness on feet     Problem List Patient Active Problem List   Diagnosis Date Noted  . Altered mental status   . Cerebral thrombosis with cerebral infarction 02/06/2017  . Cerebral embolism with cerebral infarction 02/06/2017  . Above knee amputation status, left   . Pressure injury of skin 01/30/2017  . Acute lower UTI 01/29/2017  . Acute metabolic encephalopathy 94/85/4627  . UTI (urinary tract infection) 01/29/2017  . Quadriceps muscle rupture, left, initial encounter   .  Fall 01/09/2017  . Gait disturbance 01/09/2017  . Staphylococcus aureus infection 01/09/2017  . Infection of prosthetic left knee joint ([place]) 01/09/2017  . Fever, unknown origin 11/09/2016  . Critical lower limb ischemia 08/25/2016  . Ulcer of left midfoot with fat layer exposed ([place]) 08/13/2016  . Diabetic ulcer of left midfoot associated with type 2 diabetes mellitus, with fat layer exposed ([place]) 07/25/2016  . Peripheral neuropathy 07/22/2016  . Tobacco abuse 07/22/2016  . CAD in native artery 05/18/2016  . CAD, multiple vessel 05/11/2016  . Positive cardiac stress test 05/11/2016  . Abnormal stress test 04/30/2016  . Pre-transplant evaluation for kidney transplant 04/30/2016  . S/P revision of total knee 11/26/2015  . Pain in the chest   . Acute on chronic diastolic heart failure ([place]) 07/05/2015  . Volume overload 07/04/2015  . Shortness of breath 07/04/2015  . Hypoxemia 07/04/2015  . Elevated troponin   . End-stage renal disease on hemodialysis ([place])   . Hypervolemia   . 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 Pyogenic bacterial arthritis of knee, left ([place]) 08/07/2014  . Tachycardia 07/24/2014  . Acute upper respiratory infection 07/24/2014  . ESRD on dialysis ([place]) 07/14/2014  . Type II diabetes mellitus ([place]) 07/14/2014  . Anemia in chronic kidney disease 07/14/2014  . Congestive heart failure (CHF) ([place]) 07/13/2014  . Surgical wound dehiscence 05/09/2014  . Dehiscence of closure of skin 05/09/2014  . Total knee replacement status 04/10/2014  . Diabetes mellitus with renal manifestations, controlled ([place]) 10/24/2013  . Hypertensive renal disease 06/27/2013  . DM type 2 causing vascular disease ([place]) 06/27/2013  . Erectile dysfunction 06/27/2013  . Depression 06/27/2013  . Claudication of left lower extremity ([place]) 12/19/2012  . Essential hypertension, benign 12/19/2012  . Sinusitis, acute maxillary 11/22/2012  . Otitis, externa, infective 11/14/2012   . Leg edema, left 11/14/2012  . End stage renal disease ([place]) 10/02/2012  . Controlled type 2 DM with proteinuria or microalbuminuria 09/19/2012  . GERD (gastroesophageal reflux disease) 09/19/2012  . Leukocytosis 09/19/2012  . Lacunar infarction ([place]) 08/17/2012  . Polymyalgia rheumatica ([place]) 08/17/2012  . 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 Essential hypertension 05/10/2012  . Vitamin D deficiency 05/10/2012  . Diabetes mellitus due to underlying condition ([place]) 05/10/2012  . Hyperlipidemia LDL goal <100 05/10/2012  . Anemia of  chronic disease 05/10/2012  . Screening for prostate cancer 05/10/2012  . Chronic kidney disease (CKD), stage IV (severe) ([place]) 05/10/2012  . Peripheral autonomic neuropathy due to DM ([place]) 05/10/2012  . Callus of foot 05/10/2012  . Urgency of urination 05/10/2012  . Hyperkalemia 05/10/2012  . Candidiasis of the esophagus 10/12/2011  . Internal hemorrhoids without mention of complication 10/27/5100  . Pre-syncope 07/25/2009  . DJD (degenerative joint disease) of cervical spine 02/17/2009    Willow Ora, PTA, [facility] [address] [place], [place] [address] [phone] 10/19/17, 10:57 AM   Name: Matthew Kline MRN: [id number] Date of Birth: 11/04/1960   PT Short Term Goals - 10/20/17 0820      PT SHORT TERM GOAL #1   Title  Patient ambulates 300' cane & prosthesis with supervision.     Time  4    Period  Weeks    Status  New    Target Date  11/22/17      PT SHORT TERM GOAL #2   Title  Patient negotiates ramps & curbs with cane & prosthesis (no hand hold) with minA.    Time  4    Period  Weeks    Status  New    Target Date  11/22/17      PT SHORT TERM GOAL #3   Title  Patient descends stairs with 2 rails & prosthesis alternate pattern with supervision.     Time  4    Period  Weeks    Status  New    Target Date  11/22/17      PT SHORT TERM GOAL #4   Title  Patient able to turn 360* slowly with  prosthesis only with supervision.    Time  4    Period  Weeks    Status  New    Target Date  11/22/17      PT SHORT TERM GOAL #5   Title  Patient ambulates 10' with prosthesis only carrying plate with supervision    Time  4    Period  Weeks    Status  New    Target Date  11/22/17      Jamey Reas, PT, DPT PT Specializing in [place] 10/20/17 8:25 AM Phone:  [phone]  Fax:  [phone] [facility] [address] [place], [place] [address]

## 2017-10-20 ENCOUNTER — Ambulatory Visit: Payer: Medicare HMO | Admitting: Physical Therapy

## 2017-10-24 ENCOUNTER — Other Ambulatory Visit (INDEPENDENT_AMBULATORY_CARE_PROVIDER_SITE_OTHER): Payer: Self-pay | Admitting: Orthopedic Surgery

## 2017-10-25 ENCOUNTER — Ambulatory Visit: Payer: Medicare HMO | Admitting: Physical Therapy

## 2017-10-27 ENCOUNTER — Encounter: Payer: Self-pay | Admitting: Physical Therapy

## 2017-10-27 ENCOUNTER — Ambulatory Visit: Payer: Medicare HMO | Admitting: Physical Therapy

## 2017-10-27 DIAGNOSIS — M6281 Muscle weakness (generalized): Secondary | ICD-10-CM | POA: Diagnosis not present

## 2017-10-27 DIAGNOSIS — R2681 Unsteadiness on feet: Secondary | ICD-10-CM

## 2017-10-27 DIAGNOSIS — R293 Abnormal posture: Secondary | ICD-10-CM

## 2017-10-27 DIAGNOSIS — R2689 Other abnormalities of gait and mobility: Secondary | ICD-10-CM

## 2017-10-27 NOTE — Therapy (Signed)
[facility] [address] [place], [place], [address] Phone: [phone]   Fax:  [phone]  Physical Therapy Treatment  Patient Details  Name: Matthew Kline MRN: [id number] Date of Birth: Nov 18, 1960 Referring Provider (PT): Meridee Score, MD   Encounter Date: 10/27/2017  PT End of Session - 10/27/17 0854    Visit Number  24    Number of Visits  42    Date for PT Re-Evaluation  12/21/17    Authorization Type  [employer]    PT Start Time  0850    PT Stop Time  0929    PT Time Calculation (min)  39 min    Equipment Utilized During Treatment  Gait belt    Activity Tolerance  Patient tolerated treatment well;No increased pain    Behavior During Therapy  WFL for tasks assessed/performed       Past Medical History:  Diagnosis Date  . Anemia, unspecified   . Anxiety   . Arthralgia 2010   polyarticular  . Arthritis    "back, knees" (01/10/2017)  . [facility])    "kidney area" (01/10/2017)  . CHF (congestive heart failure) ([place]) 07/25/2009   denies  . Chronic lower back pain   . Coronary artery disease   . Coughing    pt. reports that he has drainage from sinus infection  . Diabetic foot ulcer ([place])   . Diabetic neuropathy ([place])   . ESRD (end stage renal disease) on dialysis [facility])    started 12/2012; "MWF; [facility] "  (01/10/2017)  . GERD (gastroesophageal reflux disease)    hx "before I lost weight", no problem 9 years  . Hemodialysis access site with mature fistula ([place])   . Hemorrhoids, internal 10/2011   small  . High cholesterol   . History of blood transfusion    "related to the anemia"  . Hypertension   . Insomnia, unspecified   . Lacunar infarction ([place]) 2006   RUE/RLE, speech  . Long term (current) use of anticoagulants   . Myocardial infarction ([place]) 1995  . Orthostatic hypotension   . Osteomyelitis of foot, left, acute ([place])   . Other chronic postoperative pain   .  Pneumonia    "probably twice" (01/10/2017)  . Polymyalgia rheumatica ([place])   . Renal insufficiency   . Sleep apnea    "lost weight; no more problem" (01/10/2017)  . Stroke ([place]) 01/10/06   denies residual on 05/09/2014  . Type II diabetes mellitus ([place]) dx'd 1995  . Unspecified hereditary and idiopathic peripheral neuropathy    feet  . Unspecified osteomyelitis, site unspecified   . Unspecified vitamin D deficiency     Past Surgical History:  Procedure Laterality Date  . ABDOMINAL AORTOGRAM N/A 08/25/2016   Procedure: ABDOMINAL AORTOGRAM;  Surgeon: Wellington Hampshire, MD;  Location: [place] CV LAB;  Service: Cardiovascular;  Laterality: N/A;  . AMPUTATION  01/21/2012   Procedure: AMPUTATION RAY;  Surgeon: [name], MD;  Location: [place];  Service: Orthopedics;  Laterality: Left;  Left Foot 4th Ray Amputation  . AMPUTATION Left 05/04/2013   Procedure: AMPUTATION DIGIT;  Surgeon: [name], MD;  Location: [place];  Service: Orthopedics;  Laterality: Left;  Left Great Toe Amputation at [facility]  . AMPUTATION Left 01/14/2017   Procedure: AMPUTATION ABOVE LEFT KNEE;  Surgeon: [name], MD;  Location: [facility];  Service: Orthopedics;  Laterality: Left;  . ANTERIOR CERVICAL DECOMP/DISCECTOMY FUSION  02/2011  .  BACK SURGERY    . BASCILIC VEIN TRANSPOSITION Left 10/19/2012   Procedure: BASCILIC VEIN TRANSPOSITION;  Surgeon: Serafina Mitchell, MD;  Location: [facility];  Service: Vascular;  Laterality: Left;  . CARDIAC CATHETERIZATION     "before bypass"  . CORONARY ARTERY BYPASS GRAFT     x 5 with lima at [place] WITH ANTIBIOTIC SPACERS Left 08/07/2014   Procedure: Replace Left Total Knee Arthroplasty,  Place Antibiotic Spacer;  Surgeon: [name], MD;  Location: [facility];  Service: Orthopedics;  Laterality: Left;  . I&D EXTREMITY Left 05/09/2014   Procedure: Irrigation and Debridement Left Knee and Closure of Total Knee Arthroplasty Incision;  Surgeon:  [name], MD;  Location: [facility];  Service: Orthopedics;  Laterality: Left;  . I&D KNEE WITH POLY EXCHANGE Left 05/31/2014   Procedure: IRRIGATION AND DEBRIDEMENT LEFT KNEE, PLACE ANTIBIOTIC BEADS,  POLY EXCHANGE;  Surgeon: [name], MD;  Location: [facility];  Service: Orthopedics;  Laterality: Left;  . IRRIGATION AND DEBRIDEMENT KNEE Left 01/12/2017   Procedure: IRRIGATION AND DEBRIDEMENT LEFT KNEE;  Surgeon: [name], MD;  Location: [facility];  Service: Orthopedics;  Laterality: Left;  . JOINT REPLACEMENT    . KNEE ARTHROSCOPY Left 08-25-2012  . LOWER EXTREMITY ANGIOGRAPHY Left 08/25/2016   Procedure: Lower Extremity Angiography;  Surgeon: Wellington Hampshire, MD;  Location: [place] CV LAB;  Service: Cardiovascular;  Laterality: Left;  . PERIPHERAL VASCULAR BALLOON ANGIOPLASTY Left 08/25/2016   Procedure: PERIPHERAL VASCULAR BALLOON ANGIOPLASTY;  Surgeon: Wellington Hampshire, MD;  Location: [place] CV LAB;  Service: Cardiovascular;  Laterality: Left;  lt peroneal and ant tibial arteries cutting balloon  . REFRACTIVE SURGERY Bilateral   . TOE AMPUTATION Bilateral    "I've lost 7 toes over the last 7 years" (05/09/2014)  . TOE SURGERY Left April 2015   Big toe removed on left foot.  . TONSILLECTOMY    . TOTAL KNEE ARTHROPLASTY Left 04/10/2014   Procedure: TOTAL KNEE ARTHROPLASTY;  Surgeon: [name], MD;  Location: [place];  Service: Orthopedics;  Laterality: Left;  . TOTAL KNEE REVISION Left 10/25/2014   Procedure: LEFT TOTAL KNEE REVISION;  Surgeon: [name], MD;  Location: [place];  Service: Orthopedics;  Laterality: Left;  . TOTAL KNEE REVISION Left 11/26/2015   Procedure: Removal Left Total Knee Arthroplasty, Hinged Total Knee Arthroplasty;  Surgeon: [name], MD;  Location: [facility];  Service: Orthopedics;  Laterality: Left;  . UVULOPALATOPHARYNGOPLASTY, TONSILLECTOMY AND SEPTOPLASTY  ~ 1989  . WOUND DEBRIDEMENT Left 05/09/2014   Dehiscence Left Total Knee Arthroplasty  Incision    There were no vitals filed for this visit.  Subjective Assessment - 10/27/17 0853    Subjective  Saw the PA with Dr. Jess Barters office and his Chartco foot is stable, has not collasped any more. No other changes. Dialysis has finally got him on a set schedule at the old clinic as [employer] denied the transfer to the new center. No falls. Has been to the [facility] for 3 times, only using the right leg due to programming on prosthesis not sufficient to use it.     Pertinent History  Left TFA, ESRD with dialysis M,W,F, CHF, CAD s/p CABG 05/2016, PAD, CVA, DM2, neuropathy, arthritis, right toe amputations    Limitations  Lifting;Standing;Walking;House hold activities    Patient Stated Goals  He wants to use prosthesis to walk without assistance, garden, cook / work in  kitchen     Currently in Pain?  No/denies    Pain Score  0-No pain            [facility] Adult PT Treatment/Exercise - 10/27/17 0856      Transfers   Transfers  Sit to Stand;Stand to Sit    Sit to Stand  5: Supervision;With upper extremity assist;With armrests;From chair/3-in-1    Stand to Sit  5: Supervision;With upper extremity assist;With armrests;To chair/3-in-1      Ambulation/Gait   Ambulation/Gait  Yes    Ambulation/Gait Assistance  4: Min guard;4: Min assist    Ambulation/Gait Assistance Details  cues/facilitation for upright posture, with cues on step length and weight shifting with gait, occasional cues for cane placement with gait.     Ambulation Distance (Feet)  120 Feet   x2, 100 x1, plus around gym with activity   Assistive device  Prosthesis;Straight cane   with rubber quad tip   Gait Pattern  Decreased step length - right;Decreased stance time - left;Decreased stride length;Decreased hip/knee flexion - left;Decreased weight shift to left;Left circumduction;Left hip hike;Antalgic;Lateral hip instability;Trunk flexed;Decreased trunk rotation;Abducted - left;Poor foot clearance - left;Step-through pattern     Ambulation Surface  Level;Indoor      High Level Balance   High Level Balance Activities  Negotitating around obstacles;Negotiating over obstacles    High Level Balance Comments  with prosthesis/cane: forward stepping over 3 obstacles of vaired heights with cues on correct sequencing, step height/length and weight shifting with mod assist downgrading to min assit (HHA opposite cane assist); with 2 hoola hoops on floor: figure 8's around them with min to mod assist, cues on step length, pelvic position  and cane placement with gait.                             Prosthetics   Prosthetic Care Comments   Still needs to reschedule with [name] for prosthetic programming adjustments.     Current prosthetic wear tolerance (days/week)   daily    Current prosthetic wear tolerance (#hours/day)   reports all awake hours with a break somedays after dialysis for napping    Residual limb condition   no issues per pt report    Donning Prosthesis  Supervision    Doffing Prosthesis  Supervision          Balance Exercises - 10/27/17 0911      Balance Exercises: Standing   Tandem Gait  Forward;3 reps;Limitations    Retro Gait  3 reps;Limitations    Sidestepping  3 reps;Limitations      Balance Exercises: Standing   Tandem Gait Limitations  tandem stepping fwd with bil UE support, cues on posture and ex form/technique.     Retro Gait Limitations  with light UE support on bars progressing to no UE support x4 laps total with min guard to min assist for balance. cues for incr step length and weight shifting                Sidestepping Limitations  in parallel bars with no UE support; 3 laps each way with min guard assist to min assist. cues on posture, step length and weight shifting with activity.                           PT Short Term Goals - 10/20/17 0820      PT SHORT TERM GOAL #1  Title  Patient ambulates 300' cane & prosthesis with supervision.     Time  4    Period  Weeks    Status  New     Target Date  11/22/17      PT SHORT TERM GOAL #2   Title  Patient negotiates ramps & curbs with cane & prosthesis (no hand hold) with minA.    Time  4    Period  Weeks    Status  New    Target Date  11/22/17      PT SHORT TERM GOAL #3   Title  Patient descends stairs with 2 rails & prosthesis alternate pattern with supervision.     Time  4    Period  Weeks    Status  New    Target Date  11/22/17      PT SHORT TERM GOAL #4   Title  Patient able to turn 360* slowly with prosthesis only with supervision.    Time  4    Period  Weeks    Status  New    Target Date  11/22/17      PT SHORT TERM GOAL #5   Title  Patient ambulates 10' with prosthesis only carrying plate with supervision    Time  4    Period  Weeks    Status  New    Target Date  11/22/17        PT Long Term Goals - 10/06/17 1800      PT LONG TERM GOAL #1   Title  Patient verbalizes proper prosthetic care including problem solving issues and when to contact prosthetist. (All updated LTGs Target Date: 12/21/2017)    Time  3    Period  Months    Status  On-going    Target Date  12/21/17      PT LONG TERM GOAL #2   Title  Patient tolerates prosthesis wear >90% of awake hours without skin or limb pain issues to enable function throughout his day.     Time  3    Period  Months    Status  On-going    Target Date  12/21/17      PT LONG TERM GOAL #3   Title  Berg Balance with device >45/56 to indicate lower fall risk.     Time  3    Period  Months    Status  Revised    Target Date  12/21/17      PT LONG TERM GOAL #4   Title  Patient ambulates 1000' outdoors including grass, ramps & curbs with cane or less and prosthesis modified independent.     Time  3    Period  Months    Status  On-going    Target Date  12/21/17      PT LONG TERM GOAL #5   Title  Patient descends stairs with alternate pattern with single rail & cane and negotiates stairs without rails with cane step-to pattern modified independent.      Time  3    Period  Months    Status  Revised    Target Date  12/21/17      PT LONG TERM GOAL #6   Title  Patient ambulates around furniture with prosthesis only carrying light weight household items modified independent.     Time  3    Period  Months    Status  New    Target Date  12/21/17  Plan - 10/27/17 0854    Clinical Impression Statement  Today's skilled session continued to focus on gait/balance with prosthesis/cane with up to min assist for balance. No issues reported by pt with session. The pt is progressing toward LTGs and should benefit from continued PT to progress toward unmet goals.                  Rehab Potential  Good    PT Frequency  2x / week    PT Duration  12 weeks    PT Treatment/Interventions  ADLs/Self Care Home Management;DME Instruction;Gait training;Stair training;Functional mobility training;Therapeutic activities;Therapeutic exercise;Balance training;Neuromuscular re-education;Canalith Repostioning;Patient/family education;Prosthetic Training;Vestibular    PT Next Visit Plan   prosthetic gait with cane, balance activities    Consulted and Agree with Plan of Care  Patient       Patient will benefit from skilled therapeutic intervention in order to improve the following deficits and impairments:  Decreased activity tolerance, Abnormal gait, Decreased balance, Decreased endurance, Decreased knowledge of use of DME, Decreased mobility, Decreased strength, Prosthetic Dependency, Postural dysfunction, Dizziness  Visit Diagnosis: Muscle weakness (generalized)  Other abnormalities of gait and mobility  Abnormal posture  Unsteadiness on feet     Problem List Patient Active Problem List   Diagnosis Date Noted  . Altered mental status   . Cerebral thrombosis with cerebral infarction 02/06/2017  . Cerebral embolism with cerebral infarction 02/06/2017  . Above knee amputation status, left   . Pressure injury of skin 01/30/2017  . Acute  lower UTI 01/29/2017  . Acute metabolic encephalopathy 29/93/7169  . UTI (urinary tract infection) 01/29/2017  . Quadriceps muscle rupture, left, initial encounter   . Fall 01/09/2017  . Gait disturbance 01/09/2017  . Staphylococcus aureus infection 01/09/2017  . Infection of prosthetic left knee joint ([place]) 01/09/2017  . Fever, unknown origin 11/09/2016  . Critical lower limb ischemia 08/25/2016  . Ulcer of left midfoot with fat layer exposed ([place]) 08/13/2016  . Diabetic ulcer of left midfoot associated with type 2 diabetes mellitus, with fat layer exposed ([place]) 07/25/2016  . Peripheral neuropathy 07/22/2016  . Tobacco abuse 07/22/2016  . CAD in native artery 05/18/2016  . CAD, multiple vessel 05/11/2016  . Positive cardiac stress test 05/11/2016  . Abnormal stress test 04/30/2016  . Pre-transplant evaluation for kidney transplant 04/30/2016  . S/P revision of total knee 11/26/2015  . Pain in the chest   . Acute on chronic diastolic heart failure ([place]) 07/05/2015  . Volume overload 07/04/2015  . Shortness of breath 07/04/2015  . Hypoxemia 07/04/2015  . Elevated troponin   . End-stage renal disease on hemodialysis ([place])   . Hypervolemia   . 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 Pyogenic bacterial arthritis of knee, left ([place]) 08/07/2014  . Tachycardia 07/24/2014  . Acute upper respiratory infection 07/24/2014  . ESRD on dialysis ([place]) 07/14/2014  . Type II diabetes mellitus ([place]) 07/14/2014  . Anemia in chronic kidney disease 07/14/2014  . Congestive heart failure (CHF) ([place]) 07/13/2014  . Surgical wound dehiscence 05/09/2014  . Dehiscence of closure of skin 05/09/2014  . Total knee replacement status 04/10/2014  . Diabetes mellitus with renal manifestations, controlled ([place]) 10/24/2013  . Hypertensive renal disease 06/27/2013  . DM type 2 causing vascular disease ([place]) 06/27/2013  . Erectile dysfunction 06/27/2013  . Depression 06/27/2013  . Claudication  of left lower extremity ([place]) 12/19/2012  . Essential hypertension, benign 12/19/2012  . Sinusitis, acute maxillary 11/22/2012  . Otitis, externa,  infective 11/14/2012  . Leg edema, left 11/14/2012  . End stage renal disease ([place]) 10/02/2012  . Controlled type 2 DM with proteinuria or microalbuminuria 09/19/2012  . GERD (gastroesophageal reflux disease) 09/19/2012  . Leukocytosis 09/19/2012  . Lacunar infarction ([place]) 08/17/2012  . Polymyalgia rheumatica ([place]) 08/17/2012  . 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 Essential hypertension 05/10/2012  . Vitamin D deficiency 05/10/2012  . Diabetes mellitus due to underlying condition ([place]) 05/10/2012  . Hyperlipidemia LDL goal <100 05/10/2012  . Anemia of chronic disease 05/10/2012  . Screening for prostate cancer 05/10/2012  . Chronic kidney disease (CKD), stage IV (severe) ([place]) 05/10/2012  . Peripheral autonomic neuropathy due to DM ([place]) 05/10/2012  . Callus of foot 05/10/2012  . Urgency of urination 05/10/2012  . Hyperkalemia 05/10/2012  . Candidiasis of the esophagus 10/12/2011  . Internal hemorrhoids without mention of complication 32/54/9826  . Pre-syncope 07/25/2009  . DJD (degenerative joint disease) of cervical spine 02/17/2009    Willow Ora, PTA, [facility] [address] [place], [place] [address] [phone] 10/27/17, 10:35 PM   Name: Matthew Kline MRN: [id number] Date of Birth: 09/10/1960

## 2017-11-01 ENCOUNTER — Ambulatory Visit: Payer: Medicare HMO | Admitting: Physical Therapy

## 2017-11-03 ENCOUNTER — Ambulatory Visit: Payer: Medicare HMO | Admitting: Physical Therapy

## 2017-11-08 ENCOUNTER — Ambulatory Visit: Payer: Medicare HMO

## 2017-11-08 DIAGNOSIS — R2689 Other abnormalities of gait and mobility: Secondary | ICD-10-CM

## 2017-11-08 DIAGNOSIS — R2681 Unsteadiness on feet: Secondary | ICD-10-CM

## 2017-11-08 DIAGNOSIS — R293 Abnormal posture: Secondary | ICD-10-CM

## 2017-11-08 DIAGNOSIS — Z9181 History of falling: Secondary | ICD-10-CM

## 2017-11-08 DIAGNOSIS — M6281 Muscle weakness (generalized): Secondary | ICD-10-CM

## 2017-11-08 NOTE — Therapy (Signed)
[facility] [address] [place], [place], [address] Phone: [phone]   Fax:  [phone]  Physical Therapy Treatment  Patient Details  Name: Cormick Moss MRN: [id number] Date of Birth: 1960/08/23 Referring Provider (PT): Meridee Score, MD   Encounter Date: 11/08/2017  PT End of Session - 11/08/17 0906    Visit Number  24    Number of Visits  42    Date for PT Re-Evaluation  12/21/17    Authorization Type  [employer]    PT Start Time  [phone]    PT Stop Time  0930   Arrived, no charge due to medical issues.    PT Time Calculation (min)  40 min    Equipment Utilized During Treatment  Gait belt    Activity Tolerance  Patient tolerated treatment well;No increased pain    Behavior During Therapy  WFL for tasks assessed/performed       Past Medical History:  Diagnosis Date  . Anemia, unspecified   . Anxiety   . Arthralgia 2010   polyarticular  . Arthritis    "back, knees" (01/10/2017)  . Cancer [facility])    "kidney area" (01/10/2017)  . CHF (congestive heart failure) ([place]) 07/25/2009   denies  . Chronic lower back pain   . Coronary artery disease   . Coughing    pt. reports that he has drainage from sinus infection  . Diabetic foot ulcer ([place])   . Diabetic neuropathy ([place])   . ESRD (end stage renal disease) on dialysis [facility])    started 12/2012; "MWF; [facility] "  (01/10/2017)  . GERD (gastroesophageal reflux disease)    hx "before I lost weight", no problem 9 years  . Hemodialysis access site with mature fistula ([place])   . Hemorrhoids, internal 10/2011   small  . High cholesterol   . History of blood transfusion    "related to the anemia"  . Hypertension   . Insomnia, unspecified   . Lacunar infarction ([place]) 2006   RUE/RLE, speech  . Long term (current) use of anticoagulants   . Myocardial infarction ([place]) 1995  . Orthostatic hypotension   . Osteomyelitis of foot, left, acute ([place])   .  Other chronic postoperative pain   . Pneumonia    "probably twice" (01/10/2017)  . Polymyalgia rheumatica ([place])   . Renal insufficiency   . Sleep apnea    "lost weight; no more problem" (01/10/2017)  . Stroke ([place]) 01/10/06   denies residual on 05/09/2014  . Type II diabetes mellitus ([place]) dx'd 1995  . Unspecified hereditary and idiopathic peripheral neuropathy    feet  . Unspecified osteomyelitis, site unspecified   . Unspecified vitamin D deficiency     Past Surgical History:  Procedure Laterality Date  . ABDOMINAL AORTOGRAM N/A 08/25/2016   Procedure: ABDOMINAL AORTOGRAM;  Surgeon: Wellington Hampshire, MD;  Location: [facility];  Service: Cardiovascular;  Laterality: N/A;  . AMPUTATION  01/21/2012   Procedure: AMPUTATION RAY;  Surgeon: [name], MD;  Location: [facility];  Service: Orthopedics;  Laterality: Left;  Left Foot 4th Ray Amputation  . AMPUTATION Left 05/04/2013   Procedure: AMPUTATION DIGIT;  Surgeon: [name], MD;  Location: [facility];  Service: Orthopedics;  Laterality: Left;  Left Great Toe Amputation at [facility]  . AMPUTATION Left 01/14/2017   Procedure: AMPUTATION ABOVE LEFT KNEE;  Surgeon: [name], MD;  Location: [facility];  Service: Orthopedics;  Laterality: Left;  .  ANTERIOR CERVICAL DECOMP/DISCECTOMY FUSION  02/2011  . BACK SURGERY    . BASCILIC VEIN TRANSPOSITION Left 10/19/2012   Procedure: BASCILIC VEIN TRANSPOSITION;  Surgeon: Serafina Mitchell, MD;  Location: [facility];  Service: Vascular;  Laterality: Left;  . CARDIAC CATHETERIZATION     "before bypass"  . CORONARY ARTERY BYPASS GRAFT     x 5 with lima at [place] WITH ANTIBIOTIC SPACERS Left 08/07/2014   Procedure: Replace Left Total Knee Arthroplasty,  Place Antibiotic Spacer;  Surgeon: [name], MD;  Location: [facility];  Service: Orthopedics;  Laterality: Left;  . I&D EXTREMITY Left 05/09/2014   Procedure: Irrigation and Debridement Left Knee and Closure of Total  Knee Arthroplasty Incision;  Surgeon: [name], MD;  Location: [facility];  Service: Orthopedics;  Laterality: Left;  . I&D KNEE WITH POLY EXCHANGE Left 05/31/2014   Procedure: IRRIGATION AND DEBRIDEMENT LEFT KNEE, PLACE ANTIBIOTIC BEADS,  POLY EXCHANGE;  Surgeon: [name], MD;  Location: [facility];  Service: Orthopedics;  Laterality: Left;  . IRRIGATION AND DEBRIDEMENT KNEE Left 01/12/2017   Procedure: IRRIGATION AND DEBRIDEMENT LEFT KNEE;  Surgeon: [name], MD;  Location: [facility];  Service: Orthopedics;  Laterality: Left;  . JOINT REPLACEMENT    . KNEE ARTHROSCOPY Left 08-25-2012  . LOWER EXTREMITY ANGIOGRAPHY Left 08/25/2016   Procedure: Lower Extremity Angiography;  Surgeon: Wellington Hampshire, MD;  Location: [facility];  Service: Cardiovascular;  Laterality: Left;  . PERIPHERAL VASCULAR BALLOON ANGIOPLASTY Left 08/25/2016   Procedure: PERIPHERAL VASCULAR BALLOON ANGIOPLASTY;  Surgeon: Wellington Hampshire, MD;  Location: [facility];  Service: Cardiovascular;  Laterality: Left;  lt peroneal and ant tibial arteries cutting balloon  . REFRACTIVE SURGERY Bilateral   . TOE AMPUTATION Bilateral    "I've lost 7 toes over the last 7 years" (05/09/2014)  . TOE SURGERY Left April 2015   Big toe removed on left foot.  . TONSILLECTOMY    . TOTAL KNEE ARTHROPLASTY Left 04/10/2014   Procedure: TOTAL KNEE ARTHROPLASTY;  Surgeon: [name], MD;  Location: [facility];  Service: Orthopedics;  Laterality: Left;  . TOTAL KNEE REVISION Left 10/25/2014   Procedure: LEFT TOTAL KNEE REVISION;  Surgeon: [name], MD;  Location: [facility];  Service: Orthopedics;  Laterality: Left;  . TOTAL KNEE REVISION Left 11/26/2015   Procedure: Removal Left Total Knee Arthroplasty, Hinged Total Knee Arthroplasty;  Surgeon: [name], MD;  Location: [facility];  Service: Orthopedics;  Laterality: Left;  . UVULOPALATOPHARYNGOPLASTY, TONSILLECTOMY AND SEPTOPLASTY  ~ 1989  . WOUND DEBRIDEMENT Left 05/09/2014    Dehiscence Left Total Knee Arthroplasty Incision    There were no vitals filed for this visit.  Subjective Assessment - 11/08/17 0900    Subjective  Pt stated that he isnt feeling well today due to obstructive gall stones and stomach infection, back is "killing me". Doctoer did xray and CT scan, pt is going back for another CT scan this morning 10/29. Pt had not been able to do cardio or any practice with prosthesis this week.     Pertinent History  Left TFA, ESRD with dialysis M,W,F, CHF, CAD s/p CABG 05/2016, PAD, CVA, DM2, neuropathy, arthritis, right toe amputations    Limitations  Lifting;Standing;Walking;House hold activities    Patient Stated Goals  He wants to use prosthesis to walk without assistance, garden, cook / work in [employer]     Currently in Pain?  Yes    Pain Score  8     Pain Location  Back    Pain Orientation  Lower    Pain Descriptors / Indicators  Aching    Pain Type  Acute pain    Pain Onset  In the past 7 days    Pain Frequency  Constant    Aggravating Factors   Walking    Pain Relieving Factors  sitting makes it not horrible         PT Short Term Goals - 10/20/17 0820      PT SHORT TERM GOAL #1   Title  Patient ambulates 300' cane & prosthesis with supervision.     Time  4    Period  Weeks    Status  New    Target Date  11/22/17      PT SHORT TERM GOAL #2   Title  Patient negotiates ramps & curbs with cane & prosthesis (no hand hold) with minA.    Time  4    Period  Weeks    Status  New    Target Date  11/22/17      PT SHORT TERM GOAL #3   Title  Patient descends stairs with 2 rails & prosthesis alternate pattern with supervision.     Time  4    Period  Weeks    Status  New    Target Date  11/22/17      PT SHORT TERM GOAL #4   Title  Patient able to turn 360* slowly with prosthesis only with supervision.    Time  4    Period  Weeks    Status  New    Target Date  11/22/17      PT SHORT TERM GOAL #5   Title  Patient ambulates 10' with  prosthesis only carrying plate with supervision    Time  4    Period  Weeks    Status  New    Target Date  11/22/17        PT Long Term Goals - 10/06/17 1800      PT LONG TERM GOAL #1   Title  Patient verbalizes proper prosthetic care including problem solving issues and when to contact prosthetist. (All updated LTGs Target Date: 12/21/2017)    Time  3    Period  Months    Status  On-going    Target Date  12/21/17      PT LONG TERM GOAL #2   Title  Patient tolerates prosthesis wear >90% of awake hours without skin or limb pain issues to enable function throughout his day.     Time  3    Period  Months    Status  On-going    Target Date  12/21/17      PT LONG TERM GOAL #3   Title  Berg Balance with device >45/56 to indicate lower fall risk.     Time  3    Period  Months    Status  Revised    Target Date  12/21/17      PT LONG TERM GOAL #4   Title  Patient ambulates 1000' outdoors including grass, ramps & curbs with cane or less and prosthesis modified independent.     Time  3    Period  Months    Status  On-going    Target Date  12/21/17      PT LONG TERM GOAL #5   Title  Patient descends stairs with alternate pattern with single rail & cane and negotiates stairs without rails with cane step-to pattern modified independent.     Time  3    Period  Months    Status  Revised    Target Date  12/21/17      PT LONG TERM GOAL #6   Title  Patient ambulates around furniture with prosthesis only carrying light weight household items modified independent.     Time  3    Period  Months    Status  New    Target Date  12/21/17            Plan - 11/08/17 1648    Clinical Impression Statement  Pt arrived this session with excruciating LBP, attempted stretches to alleviate pain with no avail, no charge due to no successful skilled therapy this session.     Rehab Potential  Good    PT Frequency  2x / week    PT Duration  12 weeks    PT Treatment/Interventions  ADLs/Self  Care Home Management;DME Instruction;Gait training;Stair training;Functional mobility training;Therapeutic activities;Therapeutic exercise;Balance training;Neuromuscular re-education;Canalith Repostioning;Patient/family education;Prosthetic Training;Vestibular    PT Next Visit Plan   prosthetic gait with cane, balance activities    Consulted and Agree with Plan of Care  Patient       Patient will benefit from skilled therapeutic intervention in order to improve the following deficits and impairments:  Decreased activity tolerance, Abnormal gait, Decreased balance, Decreased endurance, Decreased knowledge of use of DME, Decreased mobility, Decreased strength, Prosthetic Dependency, Postural dysfunction, Dizziness  Visit Diagnosis: Muscle weakness (generalized)  Other abnormalities of gait and mobility  Abnormal posture  Unsteadiness on feet  History of falling     Problem List Patient Active Problem List   Diagnosis Date Noted  . Altered mental status   . Cerebral thrombosis with cerebral infarction 02/06/2017  . Cerebral embolism with cerebral infarction 02/06/2017  . Above knee amputation status, left   . Pressure injury of skin 01/30/2017  . Acute lower UTI 01/29/2017  . Acute metabolic encephalopathy 82/42/3536  . UTI (urinary tract infection) 01/29/2017  . Quadriceps muscle rupture, left, initial encounter   . Fall 01/09/2017  . Gait disturbance 01/09/2017  . Staphylococcus aureus infection 01/09/2017  . Infection of prosthetic left knee joint ([place]) 01/09/2017  . Fever, unknown origin 11/09/2016  . Critical lower limb ischemia 08/25/2016  . Ulcer of left midfoot with fat layer exposed ([place]) 08/13/2016  . Diabetic ulcer of left midfoot associated with type 2 diabetes mellitus, with fat layer exposed ([place]) 07/25/2016  . Peripheral neuropathy 07/22/2016  . Tobacco abuse 07/22/2016  . CAD in native artery 05/18/2016  . CAD, multiple vessel 05/11/2016  . Positive cardiac  stress test 05/11/2016  . Abnormal stress test 04/30/2016  . Pre-transplant evaluation for kidney transplant 04/30/2016  . S/P revision of total knee 11/26/2015  . Pain in the chest   . Acute on chronic diastolic heart failure ([place]) 07/05/2015  . Volume overload 07/04/2015  . Shortness of breath 07/04/2015  . Hypoxemia 07/04/2015  . Elevated troponin   . End-stage renal disease on hemodialysis ([place])   . Hypervolemia   . 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 Pyogenic bacterial arthritis of knee, left ([place]) 08/07/2014  . Tachycardia 07/24/2014  . Acute upper respiratory infection 07/24/2014  . ESRD on dialysis ([place]) 07/14/2014  . Type II diabetes mellitus ([place]) 07/14/2014  . Anemia in chronic kidney disease 07/14/2014  .  Congestive heart failure (CHF) ([place]) 07/13/2014  . Surgical wound dehiscence 05/09/2014  . Dehiscence of closure of skin 05/09/2014  . Total knee replacement status 04/10/2014  . Diabetes mellitus with renal manifestations, controlled ([place]) 10/24/2013  . Hypertensive renal disease 06/27/2013  . DM type 2 causing vascular disease ([place]) 06/27/2013  . Erectile dysfunction 06/27/2013  . Depression 06/27/2013  . Claudication of left lower extremity ([place]) 12/19/2012  . Essential hypertension, benign 12/19/2012  . Sinusitis, acute maxillary 11/22/2012  . Otitis, externa, infective 11/14/2012  . Leg edema, left 11/14/2012  . End stage renal disease ([place]) 10/02/2012  . Controlled type 2 DM with proteinuria or microalbuminuria 09/19/2012  . GERD (gastroesophageal reflux disease) 09/19/2012  . Leukocytosis 09/19/2012  . Lacunar infarction ([place]) 08/17/2012  . Polymyalgia rheumatica ([place]) 08/17/2012  . 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 Essential hypertension 05/10/2012  . Vitamin D deficiency 05/10/2012  . Diabetes mellitus due to underlying condition ([place]) 05/10/2012  . Hyperlipidemia LDL goal <100 05/10/2012  . Anemia of chronic disease 05/10/2012  .  Screening for prostate cancer 05/10/2012  . Chronic kidney disease (CKD), stage IV (severe) ([place]) 05/10/2012  . Peripheral autonomic neuropathy due to DM ([place]) 05/10/2012  . Callus of foot 05/10/2012  . Urgency of urination 05/10/2012  . Hyperkalemia 05/10/2012  . Candidiasis of the esophagus 10/12/2011  . Internal hemorrhoids without mention of complication 50/35/4656  . Pre-syncope 07/25/2009  . DJD (degenerative joint disease) of cervical spine 02/17/2009   Chassity Felts, PTA  Chassity A Felts 11/08/2017, 4:49 PM  [facility] [address] [place], [place], [address] Phone: [phone]   Fax:  [phone]  Name: Dayten Juba MRN: [id number] Date of Birth: February 13, 1960

## 2017-11-10 ENCOUNTER — Ambulatory Visit: Payer: Medicare HMO | Admitting: Physical Therapy

## 2017-11-15 ENCOUNTER — Ambulatory Visit (INDEPENDENT_AMBULATORY_CARE_PROVIDER_SITE_OTHER): Payer: Medicare HMO | Admitting: Orthopedic Surgery

## 2017-11-15 ENCOUNTER — Ambulatory Visit: Payer: Medicare HMO

## 2017-11-17 ENCOUNTER — Ambulatory Visit: Payer: Medicare HMO | Attending: Orthopedic Surgery | Admitting: Physical Therapy

## 2017-11-17 ENCOUNTER — Encounter: Payer: Self-pay | Admitting: Physical Therapy

## 2017-11-17 DIAGNOSIS — R2681 Unsteadiness on feet: Secondary | ICD-10-CM | POA: Insufficient documentation

## 2017-11-17 DIAGNOSIS — M6281 Muscle weakness (generalized): Secondary | ICD-10-CM | POA: Insufficient documentation

## 2017-11-17 DIAGNOSIS — R2689 Other abnormalities of gait and mobility: Secondary | ICD-10-CM | POA: Diagnosis present

## 2017-11-17 DIAGNOSIS — R293 Abnormal posture: Secondary | ICD-10-CM | POA: Diagnosis present

## 2017-11-18 NOTE — Therapy (Signed)
[facility] [address] [place], [place], [address] Phone: [phone]   Fax:  [phone]  Physical Therapy Treatment  Patient Details  Name: Matthew Kline MRN: [id number] Date of Birth: June 17, 1960 Referring Provider (PT): Meridee Score, MD   Encounter Date: 11/17/2017  PT End of Session - 11/17/17 1800    Visit Number  26    Number of Visits  42    Date for PT Re-Evaluation  12/21/17    Authorization Type  [employer]    PT Start Time  0845    PT Stop Time  0930    PT Time Calculation (min)  45 min    Equipment Utilized During Treatment  Gait belt    Activity Tolerance  Patient tolerated treatment well;No increased pain    Behavior During Therapy  WFL for tasks assessed/performed       Past Medical History:  Diagnosis Date  . Anemia, unspecified   . Anxiety   . Arthralgia 2010   polyarticular  . Arthritis    "back, knees" (01/10/2017)  . Cancer [facility])    "kidney area" (01/10/2017)  . CHF (congestive heart failure) ([place]) 07/25/2009   denies  . Chronic lower back pain   . Coronary artery disease   . Coughing    pt. reports that he has drainage from sinus infection  . Diabetic foot ulcer ([place])   . Diabetic neuropathy ([place])   . ESRD (end stage renal disease) on dialysis [facility])    started 12/2012; "MWF; [place] "  (01/10/2017)  . GERD (gastroesophageal reflux disease)    hx "before I lost weight", no problem 9 years  . Hemodialysis access site with mature fistula ([place])   . Hemorrhoids, internal 10/2011   small  . High cholesterol   . History of blood transfusion    "related to the anemia"  . Hypertension   . Insomnia, unspecified   . Lacunar infarction ([place]) 2006   RUE/RLE, speech  . Long term (current) use of anticoagulants   . Myocardial infarction ([place]) 1995  . Orthostatic hypotension   . Osteomyelitis of foot, left, acute ([place])   . Other chronic postoperative pain   . Pneumonia     "probably twice" (01/10/2017)  . Polymyalgia rheumatica ([place])   . Renal insufficiency   . Sleep apnea    "lost weight; no more problem" (01/10/2017)  . Stroke ([place]) 01/10/06   denies residual on 05/09/2014  . Type II diabetes mellitus ([place]) dx'd 1995  . Unspecified hereditary and idiopathic peripheral neuropathy    feet  . Unspecified osteomyelitis, site unspecified   . Unspecified vitamin D deficiency     Past Surgical History:  Procedure Laterality Date  . ABDOMINAL AORTOGRAM N/A 08/25/2016   Procedure: ABDOMINAL AORTOGRAM;  Surgeon: Wellington Hampshire, MD;  Location: [place] [facility];  Service: Cardiovascular;  Laterality: N/A;  . AMPUTATION  01/21/2012   Procedure: AMPUTATION RAY;  Surgeon: [name], MD;  Location: [place];  Service: Orthopedics;  Laterality: Left;  Left Foot 4th Ray Amputation  . AMPUTATION Left 05/04/2013   Procedure: AMPUTATION DIGIT;  Surgeon: [name], MD;  Location: [place];  Service: Orthopedics;  Laterality: Left;  Left Great Toe Amputation at [facility]  . AMPUTATION Left 01/14/2017   Procedure: AMPUTATION ABOVE LEFT KNEE;  Surgeon: [name], MD;  Location: [place];  Service: Orthopedics;  Laterality: Left;  . ANTERIOR CERVICAL DECOMP/DISCECTOMY FUSION  02/2011  .  BACK SURGERY    . BASCILIC VEIN TRANSPOSITION Left 10/19/2012   Procedure: BASCILIC VEIN TRANSPOSITION;  Surgeon: Serafina Mitchell, MD;  Location: [facility];  Service: Vascular;  Laterality: Left;  . CARDIAC CATHETERIZATION     "before bypass"  . CORONARY ARTERY BYPASS GRAFT     x 5 with lima at [place] WITH ANTIBIOTIC SPACERS Left 08/07/2014   Procedure: Replace Left Total Knee Arthroplasty,  Place Antibiotic Spacer;  Surgeon: [name], MD;  Location: [facility];  Service: Orthopedics;  Laterality: Left;  . I&D EXTREMITY Left 05/09/2014   Procedure: Irrigation and Debridement Left Knee and Closure of Total Knee Arthroplasty Incision;  Surgeon: [name], MD;  Location: [facility];  Service: Orthopedics;  Laterality: Left;  . I&D KNEE WITH POLY EXCHANGE Left 05/31/2014   Procedure: IRRIGATION AND DEBRIDEMENT LEFT KNEE, PLACE ANTIBIOTIC BEADS,  POLY EXCHANGE;  Surgeon: [name], MD;  Location: [facility];  Service: Orthopedics;  Laterality: Left;  . IRRIGATION AND DEBRIDEMENT KNEE Left 01/12/2017   Procedure: IRRIGATION AND DEBRIDEMENT LEFT KNEE;  Surgeon: [name], MD;  Location: [facility];  Service: Orthopedics;  Laterality: Left;  . JOINT REPLACEMENT    . KNEE ARTHROSCOPY Left 08-25-2012  . LOWER EXTREMITY ANGIOGRAPHY Left 08/25/2016   Procedure: Lower Extremity Angiography;  Surgeon: Wellington Hampshire, MD;  Location: [place] [facility];  Service: Cardiovascular;  Laterality: Left;  . PERIPHERAL VASCULAR BALLOON ANGIOPLASTY Left 08/25/2016   Procedure: PERIPHERAL VASCULAR BALLOON ANGIOPLASTY;  Surgeon: Wellington Hampshire, MD;  Location: [place] [facility];  Service: Cardiovascular;  Laterality: Left;  lt peroneal and ant tibial arteries cutting balloon  . REFRACTIVE SURGERY Bilateral   . TOE AMPUTATION Bilateral    "I've lost 7 toes over the last 7 years" (05/09/2014)  . TOE SURGERY Left April 2015   Big toe removed on left foot.  . TONSILLECTOMY    . TOTAL KNEE ARTHROPLASTY Left 04/10/2014   Procedure: TOTAL KNEE ARTHROPLASTY;  Surgeon: [name], MD;  Location: [place];  Service: Orthopedics;  Laterality: Left;  . TOTAL KNEE REVISION Left 10/25/2014   Procedure: LEFT TOTAL KNEE REVISION;  Surgeon: [name], MD;  Location: [facility];  Service: Orthopedics;  Laterality: Left;  . TOTAL KNEE REVISION Left 11/26/2015   Procedure: Removal Left Total Knee Arthroplasty, Hinged Total Knee Arthroplasty;  Surgeon: [name], MD;  Location: [place];  Service: Orthopedics;  Laterality: Left;  . UVULOPALATOPHARYNGOPLASTY, TONSILLECTOMY AND SEPTOPLASTY  ~ 1989  . WOUND DEBRIDEMENT Left 05/09/2014   Dehiscence Left Total Knee Arthroplasty Incision     There were no vitals filed for this visit.  Subjective Assessment - 11/17/17 0852    Subjective  He has obstructed gallbladder which is causing intense pain. Steriods helped a little. Had CT scan week ago but has not heard results.     Pertinent History  Left TFA, ESRD with dialysis M,W,F, CHF, CAD s/p CABG 05/2016, PAD, CVA, DM2, neuropathy, arthritis, right toe amputations    Limitations  Lifting;Standing;Walking;House hold activities    Patient Stated Goals  He wants to use prosthesis to walk without assistance, garden, cook / work in [employer]     Currently in Pain?  Yes    Pain Score  4    In last week, ranging 0-9/10   Pain Location  Abdomen    Pain Type  Acute pain    Pain Onset  1 to 4 weeks ago    Pain Frequency  Intermittent    Aggravating Factors   eating    Pain Relieving Factors  empty stomach    Multiple Pain Sites  No                       [facility] Adult PT Treatment/Exercise - 11/17/17 0850      Transfers   Transfers  Sit to Stand;Stand to Sit    Sit to Stand  5: Supervision;With upper extremity assist;From chair/3-in-1   chairs without armrests using UEs   Sit to Stand Details (indicate cue type and reason)  cues to engage prosthetic hydraulics    Stand to Sit  5: Supervision;With upper extremity assist;To chair/3-in-1   chairs without armrests using UEs   Stand to Sit Details  cues to engage prosthetic hydraulics      Ambulation/Gait   Ambulation/Gait  Yes    Ambulation/Gait Assistance  4: Min guard;4: Min assist    Ambulation/Gait Assistance Details  verbal & tactile cues on wt shift, upright posture & step length    Ambulation Distance (Feet)  300 Feet   300' & 200'   Assistive device  Prosthesis;Straight cane   with rubber quad tip   Gait Pattern  Decreased step length - right;Decreased stance time - left;Decreased stride length;Decreased hip/knee flexion - left;Decreased weight shift to left;Left circumduction;Left hip hike;Antalgic;Lateral  hip instability;Trunk flexed;Decreased trunk rotation;Abducted - left;Poor foot clearance - left;Step-through pattern    Ambulation Surface  Indoor;Level    Stairs  Yes    Stairs Assistance  4: Min guard;5: Supervision    Stairs Assistance Details (indicate cue type and reason)  demo, verbal & tactile cues on engaging hydraulics on descent initially step-to & progressed to alternate pattern with 2 rails. Attempted step-to with 1 rail & cane but unable without maxA at prosthetic knee.     Stair Management Technique  Two rails;Step to pattern;Alternating pattern;Forwards    Number of Stairs  4   10 reps   Ramp  4: Min assist   cane & prosthesis   Ramp Details (indicate cue type and reason)  tactile & verbal cues on posture & wt shift    Curb  3: Mod assist   cane & prosthesis   Curb Details (indicate cue type and reason)  demo & verbal cues on technique with cane including step length & wt shift. Prosthetic knee buckled on one ascent & PT lowered pt to floor.      High Level Balance   High Level Balance Activities  Negotitating around obstacles;Negotiating over obstacles;Figure 8 turns;Side stepping    High Level Balance Comments  with prosthesis/cane: forward stepping over 3 obstacles of vaired heights with cues on correct sequencing, step height/length and weight shifting with mod assist downgrading to min assit (HHA opposite cane assist); with 2 hoola hoops on floor: figure 8's around them with min to mod assist, cues on step length, pelvic position  and cane placement with gait.                             Prosthetics   Prosthetic Care Comments   Recommendation to wear prosthesis even when sick. When not wearing prosthesis, higher fall risk, increased energy expenditure and increased stress on contralateral LE/back/UEs    Current prosthetic wear tolerance (days/week)   daily    Current prosthetic wear tolerance (#hours/day)  reports all awake hours with a break somedays after dialysis for  napping    Residual limb condition   no issues per pt report    Education Provided  Proper wear schedule/adjustment    Person(s) Educated  Patient    Education Method  Explanation;Verbal cues    Education Method  Verbalized understanding;Verbal cues required               PT Short Term Goals - 11/17/17 1900      PT SHORT TERM GOAL #1   Title  Patient ambulates 300' cane & prosthesis with supervision.     Time  4    Period  Weeks    Status  On-going    Target Date  11/22/17      PT SHORT TERM GOAL #2   Title  Patient negotiates ramps & curbs with cane & prosthesis (no hand hold) with minA.    Time  4    Period  Weeks    Status  On-going    Target Date  11/22/17      PT SHORT TERM GOAL #3   Title  Patient descends stairs with 2 rails & prosthesis alternate pattern with supervision.     Baseline  11/22/2017    Time  4    Period  Weeks    Status  On-going      PT SHORT TERM GOAL #4   Title  Patient able to turn 360* slowly with prosthesis only with supervision.    Baseline  11/22/2017    Time  4    Period  Weeks    Status  On-going      PT SHORT TERM GOAL #5   Title  Patient ambulates 6' with prosthesis only carrying plate with supervision    Time  4    Period  Weeks    Status  On-going    Target Date  11/22/17        PT Long Term Goals - 10/06/17 1800      PT LONG TERM GOAL #1   Title  Patient verbalizes proper prosthetic care including problem solving issues and when to contact prosthetist. (All updated LTGs Target Date: 12/21/2017)    Time  3    Period  Months    Status  On-going    Target Date  12/21/17      PT LONG TERM GOAL #2   Title  Patient tolerates prosthesis wear >90% of awake hours without skin or limb pain issues to enable function throughout his day.     Time  3    Period  Months    Status  On-going    Target Date  12/21/17      PT LONG TERM GOAL #3   Title  Berg Balance with device >45/56 to indicate lower fall risk.     Time  3     Period  Months    Status  Revised    Target Date  12/21/17      PT LONG TERM GOAL #4   Title  Patient ambulates 1000' outdoors including grass, ramps & curbs with cane or less and prosthesis modified independent.     Time  3    Period  Months    Status  On-going    Target Date  12/21/17      PT LONG TERM GOAL #5   Title  Patient descends stairs with alternate pattern with single rail & cane and negotiates stairs  without rails with cane step-to pattern modified independent.     Time  3    Period  Months    Status  Revised    Target Date  12/21/17      PT LONG TERM GOAL #6   Title  Patient ambulates around furniture with prosthesis only carrying light weight household items modified independent.     Time  3    Period  Months    Status  New    Target Date  12/21/17            Plan - 11/17/17 1800    Clinical Impression Statement  Patient improved prosthetic knee control with descending stairs but requires use of 2 rails. He improved gait with cane requiring less assistance. He continues to have abdominal / back pain from gall bladder that is limiting his activity tolerance.     Rehab Potential  Good    PT Frequency  2x / week    PT Duration  12 weeks    PT Treatment/Interventions  ADLs/Self Care Home Management;DME Instruction;Gait training;Stair training;Functional mobility training;Therapeutic activities;Therapeutic exercise;Balance training;Neuromuscular re-education;Canalith Repostioning;Patient/family education;Prosthetic Training;Vestibular    PT Next Visit Plan  check STGs, prosthetic gait with cane, balance activities    Consulted and Agree with Plan of Care  Patient       Patient will benefit from skilled therapeutic intervention in order to improve the following deficits and impairments:  Decreased activity tolerance, Abnormal gait, Decreased balance, Decreased endurance, Decreased knowledge of use of DME, Decreased mobility, Decreased strength, Prosthetic  Dependency, Postural dysfunction, Dizziness  Visit Diagnosis: Muscle weakness (generalized)  Other abnormalities of gait and mobility  Abnormal posture  Unsteadiness on feet     Problem List Patient Active Problem List   Diagnosis Date Noted  . Altered mental status   . Cerebral thrombosis with cerebral infarction 02/06/2017  . Cerebral embolism with cerebral infarction 02/06/2017  . Above knee amputation status, left   . Pressure injury of skin 01/30/2017  . Acute lower UTI 01/29/2017  . Acute metabolic encephalopathy 56/21/3086  . UTI (urinary tract infection) 01/29/2017  . Quadriceps muscle rupture, left, initial encounter   . Fall 01/09/2017  . Gait disturbance 01/09/2017  . Staphylococcus aureus infection 01/09/2017  . Infection of prosthetic left knee joint ([place]) 01/09/2017  . Fever, unknown origin 11/09/2016  . Critical lower limb ischemia 08/25/2016  . Ulcer of left midfoot with fat layer exposed ([place]) 08/13/2016  . Diabetic ulcer of left midfoot associated with type 2 diabetes mellitus, with fat layer exposed ([place]) 07/25/2016  . Peripheral neuropathy 07/22/2016  . Tobacco abuse 07/22/2016  . CAD in native artery 05/18/2016  . CAD, multiple vessel 05/11/2016  . Positive cardiac stress test 05/11/2016  . Abnormal stress test 04/30/2016  . Pre-transplant evaluation for kidney transplant 04/30/2016  . S/P revision of total knee 11/26/2015  . Pain in the chest   . Acute on chronic diastolic heart failure ([place]) 07/05/2015  . Volume overload 07/04/2015  . Shortness of breath 07/04/2015  . Hypoxemia 07/04/2015  . Elevated troponin   . End-stage renal disease on hemodialysis ([place])   . Hypervolemia   . 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 Pyogenic bacterial arthritis of knee, left ([place]) 08/07/2014  . Tachycardia 07/24/2014  . Acute upper respiratory infection 07/24/2014  . ESRD on dialysis ([place]) 07/14/2014  . Type II diabetes mellitus ([place])  07/14/2014  . Anemia in chronic kidney disease 07/14/2014  . Congestive heart  failure (CHF) ([place]) 07/13/2014  . Surgical wound dehiscence 05/09/2014  . Dehiscence of closure of skin 05/09/2014  . Total knee replacement status 04/10/2014  . Diabetes mellitus with renal manifestations, controlled ([place]) 10/24/2013  . Hypertensive renal disease 06/27/2013  . DM type 2 causing vascular disease ([place]) 06/27/2013  . Erectile dysfunction 06/27/2013  . Depression 06/27/2013  . Claudication of left lower extremity ([place]) 12/19/2012  . Essential hypertension, benign 12/19/2012  . Sinusitis, acute maxillary 11/22/2012  . Otitis, externa, infective 11/14/2012  . Leg edema, left 11/14/2012  . End stage renal disease ([place]) 10/02/2012  . Controlled type 2 DM with proteinuria or microalbuminuria 09/19/2012  . GERD (gastroesophageal reflux disease) 09/19/2012  . Leukocytosis 09/19/2012  . Lacunar infarction ([place]) 08/17/2012  . Polymyalgia rheumatica ([place]) 08/17/2012  . 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 Essential hypertension 05/10/2012  . Vitamin D deficiency 05/10/2012  . Diabetes mellitus due to underlying condition ([place]) 05/10/2012  . Hyperlipidemia LDL goal <100 05/10/2012  . Anemia of chronic disease 05/10/2012  . Screening for prostate cancer 05/10/2012  . Chronic kidney disease (CKD), stage IV (severe) ([place]) 05/10/2012  . Peripheral autonomic neuropathy due to DM ([place]) 05/10/2012  . Callus of foot 05/10/2012  . Urgency of urination 05/10/2012  . Hyperkalemia 05/10/2012  . Candidiasis of the esophagus 10/12/2011  . Internal hemorrhoids without mention of complication 85/63/1497  . Pre-syncope 07/25/2009  . DJD (degenerative joint disease) of cervical spine 02/17/2009    Rola Lennon PT, DPT 11/18/2017, 9:47 AM  [facility] [address] [place], [place], [address] Phone: [phone]   Fax:  [phone]  Name: Love Milbourne MRN: [id number] Date of Birth: [birth year]

## 2017-11-22 ENCOUNTER — Ambulatory Visit: Payer: Medicare HMO

## 2017-11-22 DIAGNOSIS — R2681 Unsteadiness on feet: Secondary | ICD-10-CM

## 2017-11-22 DIAGNOSIS — M6281 Muscle weakness (generalized): Secondary | ICD-10-CM | POA: Diagnosis not present

## 2017-11-22 DIAGNOSIS — R2689 Other abnormalities of gait and mobility: Secondary | ICD-10-CM

## 2017-11-22 NOTE — Therapy (Cosign Needed)
[facility] [address] [place], [place], [address] Phone: [phone]   Fax:  [phone]  Physical Therapy Treatment  Patient Details  Name: Matthew Kline MRN: [id number] Date of Birth: 03-Dec-1960 Referring Provider (PT): Meridee Score, MD   Encounter Date: 11/22/2017  PT End of Session - 11/22/17 0946    Visit Number  27    Number of Visits  42    Date for PT Re-Evaluation  12/21/17    Authorization Type  [employer]    PT Start Time  0932    PT Stop Time  0930    PT Time Calculation (min)  43 min    Equipment Utilized During Treatment  Gait belt    Activity Tolerance  Patient tolerated treatment well;No increased pain    Behavior During Therapy  WFL for tasks assessed/performed       Past Medical History:  Diagnosis Date  . Anemia, unspecified   . Anxiety   . Arthralgia 2010   polyarticular  . Arthritis    "back, knees" (01/10/2017)  . Cancer [facility])    "kidney area" (01/10/2017)  . CHF (congestive heart failure) ([place]) 07/25/2009   denies  . Chronic lower back pain   . Coronary artery disease   . Coughing    pt. reports that he has drainage from sinus infection  . Diabetic foot ulcer ([place])   . Diabetic neuropathy ([place])   . ESRD (end stage renal disease) on dialysis [facility])    started 12/2012; "MWF; [facility] "  (01/10/2017)  . GERD (gastroesophageal reflux disease)    hx "before I lost weight", no problem 9 years  . Hemodialysis access site with mature fistula ([place])   . Hemorrhoids, internal 10/2011   small  . High cholesterol   . History of blood transfusion    "related to the anemia"  . Hypertension   . Insomnia, unspecified   . Lacunar infarction ([place]) 2006   RUE/RLE, speech  . Long term (current) use of anticoagulants   . Myocardial infarction ([place]) 1995  . Orthostatic hypotension   . Osteomyelitis of foot, left, acute ([place])   . Other chronic postoperative pain   .  Pneumonia    "probably twice" (01/10/2017)  . Polymyalgia rheumatica ([place])   . Renal insufficiency   . Sleep apnea    "lost weight; no more problem" (01/10/2017)  . Stroke ([place]) 01/10/06   denies residual on 05/09/2014  . Type II diabetes mellitus ([place]) dx'd 1995  . Unspecified hereditary and idiopathic peripheral neuropathy    feet  . Unspecified osteomyelitis, site unspecified   . Unspecified vitamin D deficiency     Past Surgical History:  Procedure Laterality Date  . ABDOMINAL AORTOGRAM N/A 08/25/2016   Procedure: ABDOMINAL AORTOGRAM;  Surgeon: Wellington Hampshire, MD;  Location: [place] [facility];  Service: Cardiovascular;  Laterality: N/A;  . AMPUTATION  01/21/2012   Procedure: AMPUTATION RAY;  Surgeon: [name], MD;  Location: [place];  Service: Orthopedics;  Laterality: Left;  Left Foot 4th Ray Amputation  . AMPUTATION Left 05/04/2013   Procedure: AMPUTATION DIGIT;  Surgeon: [name], MD;  Location: [place];  Service: Orthopedics;  Laterality: Left;  Left Great Toe Amputation at [facility]  . AMPUTATION Left 01/14/2017   Procedure: AMPUTATION ABOVE LEFT KNEE;  Surgeon: [name], MD;  Location: [place];  Service: Orthopedics;  Laterality: Left;  . ANTERIOR CERVICAL DECOMP/DISCECTOMY FUSION  02/2011  .  BACK SURGERY    . BASCILIC VEIN TRANSPOSITION Left 10/19/2012   Procedure: BASCILIC VEIN TRANSPOSITION;  Surgeon: Serafina Mitchell, MD;  Location: [place];  Service: Vascular;  Laterality: Left;  . CARDIAC CATHETERIZATION     "before bypass"  . CORONARY ARTERY BYPASS GRAFT     x 5 with lima at [place] WITH ANTIBIOTIC SPACERS Left 08/07/2014   Procedure: Replace Left Total Knee Arthroplasty,  Place Antibiotic Spacer;  Surgeon: [name], MD;  Location: [place];  Service: Orthopedics;  Laterality: Left;  . I&D EXTREMITY Left 05/09/2014   Procedure: Irrigation and Debridement Left Knee and Closure of Total Knee Arthroplasty Incision;  Surgeon:  [name], MD;  Location: [place];  Service: Orthopedics;  Laterality: Left;  . I&D KNEE WITH POLY EXCHANGE Left 05/31/2014   Procedure: IRRIGATION AND DEBRIDEMENT LEFT KNEE, PLACE ANTIBIOTIC BEADS,  POLY EXCHANGE;  Surgeon: [name], MD;  Location: [place];  Service: Orthopedics;  Laterality: Left;  . IRRIGATION AND DEBRIDEMENT KNEE Left 01/12/2017   Procedure: IRRIGATION AND DEBRIDEMENT LEFT KNEE;  Surgeon: [name], MD;  Location: [place];  Service: Orthopedics;  Laterality: Left;  . JOINT REPLACEMENT    . KNEE ARTHROSCOPY Left 08-25-2012  . LOWER EXTREMITY ANGIOGRAPHY Left 08/25/2016   Procedure: Lower Extremity Angiography;  Surgeon: Wellington Hampshire, MD;  Location: [place] [facility];  Service: Cardiovascular;  Laterality: Left;  . PERIPHERAL VASCULAR BALLOON ANGIOPLASTY Left 08/25/2016   Procedure: PERIPHERAL VASCULAR BALLOON ANGIOPLASTY;  Surgeon: Wellington Hampshire, MD;  Location: [place] [facility];  Service: Cardiovascular;  Laterality: Left;  lt peroneal and ant tibial arteries cutting balloon  . REFRACTIVE SURGERY Bilateral   . TOE AMPUTATION Bilateral    "I've lost 7 toes over the last 7 years" (05/09/2014)  . TOE SURGERY Left April 2015   Big toe removed on left foot.  . TONSILLECTOMY    . TOTAL KNEE ARTHROPLASTY Left 04/10/2014   Procedure: TOTAL KNEE ARTHROPLASTY;  Surgeon: [name], MD;  Location: [place];  Service: Orthopedics;  Laterality: Left;  . TOTAL KNEE REVISION Left 10/25/2014   Procedure: LEFT TOTAL KNEE REVISION;  Surgeon: [name], MD;  Location: [place];  Service: Orthopedics;  Laterality: Left;  . TOTAL KNEE REVISION Left 11/26/2015   Procedure: Removal Left Total Knee Arthroplasty, Hinged Total Knee Arthroplasty;  Surgeon: [name], MD;  Location: [place];  Service: Orthopedics;  Laterality: Left;  . UVULOPALATOPHARYNGOPLASTY, TONSILLECTOMY AND SEPTOPLASTY  ~ 1989  . WOUND DEBRIDEMENT Left 05/09/2014   Dehiscence Left Total Knee Arthroplasty  Incision    There were no vitals filed for this visit.  Subjective Assessment - 11/22/17 0851    Subjective  Have not heard about his CT results yet.     Pertinent History  Left TFA, ESRD with dialysis M,W,F, CHF, CAD s/p CABG 05/2016, PAD, CVA, DM2, neuropathy, arthritis, right toe amputations    Limitations  Lifting;Standing;Walking;House hold activities    Patient Stated Goals  He wants to use prosthesis to walk without assistance, garden, cook / work in [employer]     Currently in Pain?  Yes    Pain Score  3     Pain Location  Back    Pain Orientation  Lower    Pain Descriptors / Indicators  Discomfort    Pain Type  Acute pain    Pain Onset  1 to 4 weeks ago  Pain Frequency  Intermittent                       [facility] Adult PT Treatment/Exercise - 11/22/17 0937      Transfers   Transfers  Sit to Stand;Stand to Sit    Sit to Stand  5: Supervision    Stand to Sit  5: Supervision;With upper extremity assist      Ambulation/Gait   Ambulation/Gait  Yes    Ambulation/Gait Assistance  5: Supervision;4: Min guard    Ambulation/Gait Assistance Details  verbal cues to slow pace for safety and posture.     Ambulation Distance (Feet)  300 Feet    Assistive device  Straight cane;Prosthesis    Gait Pattern  Step-through pattern;Decreased step length - right;Decreased stance time - left;Decreased stride length;Decreased weight shift to left;Trunk flexed;Poor foot clearance - left    Ambulation Surface  Indoor;Level    Stairs  Yes    Stairs Assistance  4: Min guard;4: Min assist    Stairs Assistance Details (indicate cue type and reason)  ascending with step to pattern and descending with alternating pattern. verbal cues and demo needed to shift hips foward over prosthesis.     Stair Management Technique  Two rails;Step to pattern;Alternating pattern    Number of Stairs  4   multiple reps    Ramp  4: Min assist    Ramp Details (indicate cue type and reason)  verbal cues to  slow pace for safety     Curb  4: Min assist   simulated curb with aerobic step   Curb Details (indicate cue type and reason)  Verbal cues for sequencing and to slow pace for safety.       Prosthetics   Current prosthetic wear tolerance (days/week)   daily    Current prosthetic wear tolerance (#hours/day)   reports all awake hours with a break somedays after dialysis for napping    Residual limb condition   no issues per pt report               PT Short Term Goals - 11/22/17 0855      PT SHORT TERM GOAL #1   Title  Patient ambulates 300' cane & prosthesis with supervision.     Baseline  11/22/17: Pt ambulates 300 ft with cane and prosthesis with supervision/min guard and LOB x1     Time  4    Period  Weeks    Status  Partially Met      PT SHORT TERM GOAL #2   Title  Patient negotiates ramps & curbs with cane & prosthesis (no hand hold) with minA.    Baseline  10/22/17: Pt negotiates ramps and simulated curb with cane and min A     Time  4    Period  Weeks    Status  Achieved      PT SHORT TERM GOAL #3   Title  Patient descends stairs with 2 rails & prosthesis alternate pattern with supervision.     Baseline  11/22/2017: met today     Time  4    Period  Weeks    Status  Achieved      PT SHORT TERM GOAL #4   Title  Patient able to turn 360* slowly with prosthesis only with supervision.    Baseline  11/22/17: met today     Time  4    Period  Weeks    Status  Achieved      PT SHORT TERM GOAL #5   Title  Patient ambulates 56' with prosthesis only carrying plate with supervision    Baseline  11/22/17: Pt ambulates 10 ft and back with min guard.     Time  4    Period  Weeks    Status  Not Met        PT Long Term Goals - 10/06/17 1800      PT LONG TERM GOAL #1   Title  Patient verbalizes proper prosthetic care including problem solving issues and when to contact prosthetist. (All updated LTGs Target Date: 12/21/2017)    Time  3    Period  Months    Status   On-going    Target Date  12/21/17      PT LONG TERM GOAL #2   Title  Patient tolerates prosthesis wear >90% of awake hours without skin or limb pain issues to enable function throughout his day.     Time  3    Period  Months    Status  On-going    Target Date  12/21/17      PT LONG TERM GOAL #3   Title  Berg Balance with device >45/56 to indicate lower fall risk.     Time  3    Period  Months    Status  Revised    Target Date  12/21/17      PT LONG TERM GOAL #4   Title  Patient ambulates 1000' outdoors including grass, ramps & curbs with cane or less and prosthesis modified independent.     Time  3    Period  Months    Status  On-going    Target Date  12/21/17      PT LONG TERM GOAL #5   Title  Patient descends stairs with alternate pattern with single rail & cane and negotiates stairs without rails with cane step-to pattern modified independent.     Time  3    Period  Months    Status  Revised    Target Date  12/21/17      PT LONG TERM GOAL #6   Title  Patient ambulates around furniture with prosthesis only carrying light weight household items modified independent.     Time  3    Period  Months    Status  New    Target Date  12/21/17            Plan - 11/22/17 0947    Clinical Impression Statement  Today's session assessed all STGs with pt meeting 3/5 and partially meeting 1. Pt improved knee control descending stairs with alternating pattern after several reps. Pt would benefit from further PT to continue progressing toward unmet goals.     Rehab Potential  Good    PT Frequency  2x / week    PT Duration  12 weeks    PT Treatment/Interventions  ADLs/Self Care Home Management;DME Instruction;Gait training;Stair training;Functional mobility training;Therapeutic activities;Therapeutic exercise;Balance training;Neuromuscular re-education;Canalith Repostioning;Patient/family education;Prosthetic Training;Vestibular    PT Next Visit Plan  check STGs, prosthetic gait  with cane, balance activities    Consulted and Agree with Plan of Care  Patient       Patient will benefit from skilled therapeutic intervention in order to improve the following deficits and impairments:  Decreased activity tolerance, Abnormal gait, Decreased balance, Decreased endurance, Decreased knowledge of use of DME, Decreased mobility, Decreased strength, Prosthetic Dependency, Postural dysfunction, Dizziness  Visit Diagnosis: Other abnormalities of gait and mobility  Muscle weakness (generalized)  Unsteadiness on feet     Problem List Patient Active Problem List   Diagnosis Date Noted  . Altered mental status   . Cerebral thrombosis with cerebral infarction 02/06/2017  . Cerebral embolism with cerebral infarction 02/06/2017  . Above knee amputation status, left   . Pressure injury of skin 01/30/2017  . Acute lower UTI 01/29/2017  . Acute metabolic encephalopathy 70/35/0093  . UTI (urinary tract infection) 01/29/2017  . Quadriceps muscle rupture, left, initial encounter   . Fall 01/09/2017  . Gait disturbance 01/09/2017  . Staphylococcus aureus infection 01/09/2017  . Infection of prosthetic left knee joint ([place]) 01/09/2017  . Fever, unknown origin 11/09/2016  . Critical lower limb ischemia 08/25/2016  . Ulcer of left midfoot with fat layer exposed ([place]) 08/13/2016  . Diabetic ulcer of left midfoot associated with type 2 diabetes mellitus, with fat layer exposed ([place]) 07/25/2016  . Peripheral neuropathy 07/22/2016  . Tobacco abuse 07/22/2016  . CAD in native artery 05/18/2016  . CAD, multiple vessel 05/11/2016  . Positive cardiac stress test 05/11/2016  . Abnormal stress test 04/30/2016  . Pre-transplant evaluation for kidney transplant 04/30/2016  . S/P revision of total knee 11/26/2015  . Pain in the chest   . Acute on chronic diastolic heart failure ([place]) 07/05/2015  . Volume overload 07/04/2015  . Shortness of breath 07/04/2015  . Hypoxemia 07/04/2015  .  Elevated troponin   . End-stage renal disease on hemodialysis ([place])   . Hypervolemia   . 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 Pyogenic bacterial arthritis of knee, left ([place]) 08/07/2014  . Tachycardia 07/24/2014  . Acute upper respiratory infection 07/24/2014  . ESRD on dialysis ([place]) 07/14/2014  . Type II diabetes mellitus ([place]) 07/14/2014  . Anemia in chronic kidney disease 07/14/2014  . Congestive heart failure (CHF) ([place]) 07/13/2014  . Surgical wound dehiscence 05/09/2014  . Dehiscence of closure of skin 05/09/2014  . Total knee replacement status 04/10/2014  . Diabetes mellitus with renal manifestations, controlled ([place]) 10/24/2013  . Hypertensive renal disease 06/27/2013  . DM type 2 causing vascular disease ([place]) 06/27/2013  . Erectile dysfunction 06/27/2013  . Depression 06/27/2013  . Claudication of left lower extremity ([place]) 12/19/2012  . Essential hypertension, benign 12/19/2012  . Sinusitis, acute maxillary 11/22/2012  . Otitis, externa, infective 11/14/2012  . Leg edema, left 11/14/2012  . End stage renal disease ([place]) 10/02/2012  . Controlled type 2 DM with proteinuria or microalbuminuria 09/19/2012  . GERD (gastroesophageal reflux disease) 09/19/2012  . Leukocytosis 09/19/2012  . Lacunar infarction ([place]) 08/17/2012  . Polymyalgia rheumatica ([place]) 08/17/2012  . 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 Essential hypertension 05/10/2012  . Vitamin D deficiency 05/10/2012  . Diabetes mellitus due to underlying condition ([place]) 05/10/2012  . Hyperlipidemia LDL goal <100 05/10/2012  . Anemia of chronic disease 05/10/2012  . Screening for prostate cancer 05/10/2012  . Chronic kidney disease (CKD), stage IV (severe) ([place]) 05/10/2012  . Peripheral autonomic neuropathy due to DM ([place]) 05/10/2012  . Callus of foot 05/10/2012  . Urgency of urination 05/10/2012  . Hyperkalemia 05/10/2012  . Candidiasis of the esophagus 10/12/2011  . Internal hemorrhoids without  mention of complication 81/82/9937  . Pre-syncope 07/25/2009  . DJD (degenerative joint disease) of cervical spine 02/17/2009    Cecile Sheerer, SPTA 11/22/2017, 9:54 AM  [facility] [address] [place], [place], [address] Phone:  [phone]   Fax:  [phone]  Name: Renald Haithcock MRN: [id number] Date of Birth: 1960/07/18

## 2017-11-22 NOTE — Therapy (Signed)
This entire session was performed under direct supervision and direction of a licensed Brewing technologist . The therapist was present and actively participating. I have personally read, edited and approve of the note as written. Matthew Kline, PTA  [facility] [address] [place], [place], [address] Phone: [phone]   Fax:  [phone]  Physical Therapy Treatment  Patient Details  Name: Matthew Kline MRN: [id number] Date of Birth: 1960-08-21 Referring Provider (PT): Meridee Score, MD   Encounter Date: 11/22/2017  PT End of Session - 11/22/17 0946    Visit Number  27    Number of Visits  42    Date for PT Re-Evaluation  12/21/17    Authorization Type  [employer]    PT Start Time  5573    PT Stop Time  0930    PT Time Calculation (min)  43 min    Equipment Utilized During Treatment  Gait belt    Activity Tolerance  Patient tolerated treatment well;No increased pain    Behavior During Therapy  WFL for tasks assessed/performed       Past Medical History:  Diagnosis Date  . Anemia, unspecified   . Anxiety   . Arthralgia 2010   polyarticular  . Arthritis    "back, knees" (01/10/2017)  . Cancer [facility])    "kidney area" (01/10/2017)  . CHF (congestive heart failure) ([place]) 07/25/2009   denies  . Chronic lower back pain   . Coronary artery disease   . Coughing    pt. reports that he has drainage from sinus infection  . Diabetic foot ulcer ([place])   . Diabetic neuropathy ([place])   . ESRD (end stage renal disease) on dialysis [facility])    started 12/2012; "MWF; [place] "  (01/10/2017)  . GERD (gastroesophageal reflux disease)    hx "before I lost weight", no problem 9 years  . Hemodialysis access site with mature fistula ([place])   . Hemorrhoids, internal 10/2011   small  . High cholesterol   . History of blood transfusion    "related to the anemia"  . Hypertension   . Insomnia, unspecified   .  Lacunar infarction ([place]) 2006   RUE/RLE, speech  . Long term (current) use of anticoagulants   . Myocardial infarction ([place]) 1995  . Orthostatic hypotension   . Osteomyelitis of foot, left, acute ([place])   . Other chronic postoperative pain   . Pneumonia    "probably twice" (01/10/2017)  . Polymyalgia rheumatica ([place])   . Renal insufficiency   . Sleep apnea    "lost weight; no more problem" (01/10/2017)  . Stroke ([place]) 01/10/06   denies residual on 05/09/2014  . Type II diabetes mellitus ([place]) dx'd 1995  . Unspecified hereditary and idiopathic peripheral neuropathy    feet  . Unspecified osteomyelitis, site unspecified   . Unspecified vitamin D deficiency     Past Surgical History:  Procedure Laterality Date  . ABDOMINAL AORTOGRAM N/A 08/25/2016   Procedure: ABDOMINAL AORTOGRAM;  Surgeon: Wellington Hampshire, MD;  Location: [place] [facility] LAB;  Service: Cardiovascular;  Laterality: N/A;  . AMPUTATION  01/21/2012   Procedure: AMPUTATION RAY;  Surgeon: [name], MD;  Location: [place];  Service: Orthopedics;  Laterality: Left;  Left Foot 4th Ray Amputation  . AMPUTATION Left 05/04/2013   Procedure: AMPUTATION DIGIT;  Surgeon: [name], MD;  Location: [place];  Service: Orthopedics;  Laterality: Left;  Left Great Toe Amputation at  [facility]  . AMPUTATION Left 01/14/2017   Procedure: AMPUTATION ABOVE LEFT KNEE;  Surgeon: [name], MD;  Location: [place];  Service: Orthopedics;  Laterality: Left;  . ANTERIOR CERVICAL DECOMP/DISCECTOMY FUSION  02/2011  . BACK SURGERY    . BASCILIC VEIN TRANSPOSITION Left 10/19/2012   Procedure: BASCILIC VEIN TRANSPOSITION;  Surgeon: Serafina Mitchell, MD;  Location: [place];  Service: Vascular;  Laterality: Left;  . CARDIAC CATHETERIZATION     "before bypass"  . CORONARY ARTERY BYPASS GRAFT     x 5 with lima at [place] WITH ANTIBIOTIC SPACERS Left 08/07/2014   Procedure: Replace Left Total Knee Arthroplasty,  Place  Antibiotic Spacer;  Surgeon: [name], MD;  Location: [place];  Service: Orthopedics;  Laterality: Left;  . I&D EXTREMITY Left 05/09/2014   Procedure: Irrigation and Debridement Left Knee and Closure of Total Knee Arthroplasty Incision;  Surgeon: [name], MD;  Location: [place];  Service: Orthopedics;  Laterality: Left;  . I&D KNEE WITH POLY EXCHANGE Left 05/31/2014   Procedure: IRRIGATION AND DEBRIDEMENT LEFT KNEE, PLACE ANTIBIOTIC BEADS,  POLY EXCHANGE;  Surgeon: [name], MD;  Location: [place];  Service: Orthopedics;  Laterality: Left;  . IRRIGATION AND DEBRIDEMENT KNEE Left 01/12/2017   Procedure: IRRIGATION AND DEBRIDEMENT LEFT KNEE;  Surgeon: [name], MD;  Location: [name];  Service: Orthopedics;  Laterality: Left;  . JOINT REPLACEMENT    . KNEE ARTHROSCOPY Left 08-25-2012  . LOWER EXTREMITY ANGIOGRAPHY Left 08/25/2016   Procedure: Lower Extremity Angiography;  Surgeon: Wellington Hampshire, MD;  Location: [place] CV LAB;  Service: Cardiovascular;  Laterality: Left;  . PERIPHERAL VASCULAR BALLOON ANGIOPLASTY Left 08/25/2016   Procedure: PERIPHERAL VASCULAR BALLOON ANGIOPLASTY;  Surgeon: Wellington Hampshire, MD;  Location: [place] CV LAB;  Service: Cardiovascular;  Laterality: Left;  lt peroneal and ant tibial arteries cutting balloon  . REFRACTIVE SURGERY Bilateral   . TOE AMPUTATION Bilateral    "I've lost 7 toes over the last 7 years" (05/09/2014)  . TOE SURGERY Left April 2015   Big toe removed on left foot.  . TONSILLECTOMY    . TOTAL KNEE ARTHROPLASTY Left 04/10/2014   Procedure: TOTAL KNEE ARTHROPLASTY;  Surgeon: [name], MD;  Location: [place];  Service: Orthopedics;  Laterality: Left;  . TOTAL KNEE REVISION Left 10/25/2014   Procedure: LEFT TOTAL KNEE REVISION;  Surgeon: [name], MD;  Location: [place];  Service: Orthopedics;  Laterality: Left;  . TOTAL KNEE REVISION Left 11/26/2015   Procedure: Removal Left Total Knee Arthroplasty, Hinged Total Knee  Arthroplasty;  Surgeon: [name], MD;  Location: [place];  Service: Orthopedics;  Laterality: Left;  . UVULOPALATOPHARYNGOPLASTY, TONSILLECTOMY AND SEPTOPLASTY  ~ 1989  . WOUND DEBRIDEMENT Left 05/09/2014   Dehiscence Left Total Knee Arthroplasty Incision    There were no vitals filed for this visit.  Subjective Assessment - 11/22/17 0851    Subjective  Pt states he will find out CT scan results later today 11/12. No falls to report    Pertinent History  Left TFA, ESRD with dialysis M,W,F, CHF, CAD s/p CABG 05/2016, PAD, CVA, DM2, neuropathy, arthritis, right toe amputations    Limitations  Lifting;Standing;Walking;House hold activities    Patient Stated Goals  He wants to use prosthesis to walk without assistance, garden, cook / work in [employer]     Currently in Pain?  Yes    Pain  Score  3     Pain Location  Back    Pain Orientation  Lower    Pain Descriptors / Indicators  Discomfort    Pain Type  Acute pain    Pain Onset  1 to 4 weeks ago    Pain Frequency  Intermittent       [facility] Adult PT Treatment/Exercise - 11/22/17 0937      Transfers   Transfers  Sit to Stand;Stand to Sit    Sit to Stand  5: Supervision    Stand to Sit  5: Supervision;With upper extremity assist      Ambulation/Gait   Ambulation/Gait  Yes    Ambulation/Gait Assistance  5: Supervision;4: Min guard    Ambulation/Gait Assistance Details  verbal cues to slow pace for safety and posture.     Ambulation Distance (Feet)  300 Feet    Assistive device  Straight cane;Prosthesis    Gait Pattern  Step-through pattern;Decreased step length - right;Decreased stance time - left;Decreased stride length;Decreased weight shift to left;Trunk flexed;Poor foot clearance - left    Ambulation Surface  Indoor;Level    Stairs  Yes    Stairs Assistance  4: Min guard;4: Min assist    Stairs Assistance Details (indicate cue type and reason)  ascending with step to pattern and descending with alternating pattern. verbal cues and  demo needed to shift hips foward over prosthesis.     Stair Management Technique  Two rails;Step to pattern;Alternating pattern    Number of Stairs  4   multiple reps    Height of Stairs  6    Ramp  4: Min assist    Ramp Details (indicate cue type and reason)  verbal cues to slow pace for safety     Curb  4: Min assist   simulated curb with aerobic step   Curb Details (indicate cue type and reason)  Verbal cues for sequencing and to slow pace for safety.       Prosthetics   Current prosthetic wear tolerance (days/week)   daily    Current prosthetic wear tolerance (#hours/day)   reports all awake hours with a break somedays after dialysis for napping    Residual limb condition   no issues per pt report        PT Short Term Goals - 11/22/17 0855      PT SHORT TERM GOAL #1   Title  Patient ambulates 300' cane & prosthesis with supervision.     Baseline  11/22/17: Pt ambulates 300 ft with cane and prosthesis with supervision/min guard and LOB x1     Time  4    Period  Weeks    Status  Partially Met      PT SHORT TERM GOAL #2   Title  Patient negotiates ramps & curbs with cane & prosthesis (no hand hold) with minA.    Baseline  10/22/17: Pt negotiates ramps and simulated curb with cane and min A     Time  4    Period  Weeks    Status  Achieved      PT SHORT TERM GOAL #3   Title  Patient descends stairs with 2 rails & prosthesis alternate pattern with supervision.     Baseline  11/22/2017: Pt descends stairs at supervision level with 2 rails/prosthesis.     Time  4    Period  Weeks    Status  Achieved      PT SHORT TERM GOAL #  4   Title  Patient able to turn 360* slowly with prosthesis only with supervision.    Baseline  11/22/17: Pt turns 360 slowly at supervision level with prosthesis.     Time  4    Period  Weeks    Status  Achieved      PT SHORT TERM GOAL #5   Title  Patient ambulates 11' with prosthesis only carrying plate with supervision    Baseline  11/22/17: Pt  ambulates 20 ft while carrying plate upright with prosthesis only, min guard assistance.     Time  4    Period  Weeks    Status  Not Met        PT Long Term Goals - 10/06/17 1800      PT LONG TERM GOAL #1   Title  Patient verbalizes proper prosthetic care including problem solving issues and when to contact prosthetist. (All updated LTGs Target Date: 12/21/2017)    Time  3    Period  Months    Status  On-going    Target Date  12/21/17      PT LONG TERM GOAL #2   Title  Patient tolerates prosthesis wear >90% of awake hours without skin or limb pain issues to enable function throughout his day.     Time  3    Period  Months    Status  On-going    Target Date  12/21/17      PT LONG TERM GOAL #3   Title  Berg Balance with device >45/56 to indicate lower fall risk.     Time  3    Period  Months    Status  Revised    Target Date  12/21/17      PT LONG TERM GOAL #4   Title  Patient ambulates 1000' outdoors including grass, ramps & curbs with cane or less and prosthesis modified independent.     Time  3    Period  Months    Status  On-going    Target Date  12/21/17      PT LONG TERM GOAL #5   Title  Patient descends stairs with alternate pattern with single rail & cane and negotiates stairs without rails with cane step-to pattern modified independent.     Time  3    Period  Months    Status  Revised    Target Date  12/21/17      PT LONG TERM GOAL #6   Title  Patient ambulates around furniture with prosthesis only carrying light weight household items modified independent.     Time  3    Period  Months    Status  New    Target Date  12/21/17            Plan - 11/22/17 0947    Clinical Impression Statement  Today's session assessed all STGs with pt meeting 3/5 and partially meeting 1. Pt improved knee control descending stairs with alternating pattern after several reps. Pt would benefit from further PT to continue progressing toward unmet goals.     Rehab Potential   Good    PT Frequency  2x / week    PT Duration  12 weeks    PT Treatment/Interventions  ADLs/Self Care Home Management;DME Instruction;Gait training;Stair training;Functional mobility training;Therapeutic activities;Therapeutic exercise;Balance training;Neuromuscular re-education;Canalith Repostioning;Patient/family education;Prosthetic Training;Vestibular    PT Next Visit Plan  Prosthetic gait with cane, balance activities    Consulted and Agree with Plan  of Care  Patient       Patient will benefit from skilled therapeutic intervention in order to improve the following deficits and impairments:  Decreased activity tolerance, Abnormal gait, Decreased balance, Decreased endurance, Decreased knowledge of use of DME, Decreased mobility, Decreased strength, Prosthetic Dependency, Postural dysfunction, Dizziness  Visit Diagnosis: Other abnormalities of gait and mobility  Muscle weakness (generalized)  Unsteadiness on feet     Problem List Patient Active Problem List   Diagnosis Date Noted  . Altered mental status   . Cerebral thrombosis with cerebral infarction 02/06/2017  . Cerebral embolism with cerebral infarction 02/06/2017  . Above knee amputation status, left   . Pressure injury of skin 01/30/2017  . Acute lower UTI 01/29/2017  . Acute metabolic encephalopathy 54/27/0623  . UTI (urinary tract infection) 01/29/2017  . Quadriceps muscle rupture, left, initial encounter   . Fall 01/09/2017  . Gait disturbance 01/09/2017  . Staphylococcus aureus infection 01/09/2017  . Infection of prosthetic left knee joint ([place]) 01/09/2017  . Fever, unknown origin 11/09/2016  . Critical lower limb ischemia 08/25/2016  . Ulcer of left midfoot with fat layer exposed ([place]) 08/13/2016  . Diabetic ulcer of left midfoot associated with type 2 diabetes mellitus, with fat layer exposed ([place]) 07/25/2016  . Peripheral neuropathy 07/22/2016  . Tobacco abuse 07/22/2016  . CAD in native artery  05/18/2016  . CAD, multiple vessel 05/11/2016  . Positive cardiac stress test 05/11/2016  . Abnormal stress test 04/30/2016  . Pre-transplant evaluation for kidney transplant 04/30/2016  . S/P revision of total knee 11/26/2015  . Pain in the chest   . Acute on chronic diastolic heart failure ([place]) 07/05/2015  . Volume overload 07/04/2015  . Shortness of breath 07/04/2015  . Hypoxemia 07/04/2015  . Elevated troponin   . End-stage renal disease on hemodialysis ([place])   . Hypervolemia   . 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 Pyogenic bacterial arthritis of knee, left ([place]) 08/07/2014  . Tachycardia 07/24/2014  . Acute upper respiratory infection 07/24/2014  . ESRD on dialysis ([place]) 07/14/2014  . Type II diabetes mellitus ([place]) 07/14/2014  . Anemia in chronic kidney disease 07/14/2014  . Congestive heart failure (CHF) ([place]) 07/13/2014  . Surgical wound dehiscence 05/09/2014  . Dehiscence of closure of skin 05/09/2014  . Total knee replacement status 04/10/2014  . Diabetes mellitus with renal manifestations, controlled ([place]) 10/24/2013  . Hypertensive renal disease 06/27/2013  . DM type 2 causing vascular disease ([place]) 06/27/2013  . Erectile dysfunction 06/27/2013  . Depression 06/27/2013  . Claudication of left lower extremity ([place]) 12/19/2012  . Essential hypertension, benign 12/19/2012  . Sinusitis, acute maxillary 11/22/2012  . Otitis, externa, infective 11/14/2012  . Leg edema, left 11/14/2012  . End stage renal disease ([place]) 10/02/2012  . Controlled type 2 DM with proteinuria or microalbuminuria 09/19/2012  . GERD (gastroesophageal reflux disease) 09/19/2012  . Leukocytosis 09/19/2012  . Lacunar infarction ([place]) 08/17/2012  . Polymyalgia rheumatica ([place]) 08/17/2012  . 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 Essential hypertension 05/10/2012  . Vitamin D deficiency 05/10/2012  . Diabetes mellitus due to underlying condition ([place]) 05/10/2012  . Hyperlipidemia  LDL goal <100 05/10/2012  . Anemia of chronic disease 05/10/2012  . Screening for prostate cancer 05/10/2012  . Chronic kidney disease (CKD), stage IV (severe) ([place]) 05/10/2012  . Peripheral autonomic neuropathy due to DM ([place]) 05/10/2012  . Callus of foot 05/10/2012  . Urgency of urination 05/10/2012  . Hyperkalemia 05/10/2012  . Candidiasis  of the esophagus 10/12/2011  . Internal hemorrhoids without mention of complication 49/70/2637  . Pre-syncope 07/25/2009  . DJD (degenerative joint disease) of cervical spine 02/17/2009   Shatana Saxton, PTA  [facility] Revak A Virgie Kunda 11/22/2017, 10:41 PM  [facility] [address] [place], [place], [address] Phone: [phone]   Fax:  [phone]  Name: Nickey Kloepfer MRN: [id number] Date of Birth: 24-Sep-1960

## 2017-11-24 ENCOUNTER — Other Ambulatory Visit (INDEPENDENT_AMBULATORY_CARE_PROVIDER_SITE_OTHER): Payer: Self-pay | Admitting: Physician Assistant

## 2017-11-24 ENCOUNTER — Ambulatory Visit: Payer: Medicare HMO | Admitting: Physical Therapy

## 2017-11-24 ENCOUNTER — Encounter: Payer: Self-pay | Admitting: Physical Therapy

## 2017-11-24 DIAGNOSIS — R2681 Unsteadiness on feet: Secondary | ICD-10-CM

## 2017-11-24 DIAGNOSIS — M6281 Muscle weakness (generalized): Secondary | ICD-10-CM

## 2017-11-24 DIAGNOSIS — R2689 Other abnormalities of gait and mobility: Secondary | ICD-10-CM

## 2017-11-24 DIAGNOSIS — R293 Abnormal posture: Secondary | ICD-10-CM

## 2017-11-25 NOTE — Therapy (Signed)
[facility] [address] [place], [place], [address] Phone: [phone]   Fax:  [phone]  Physical Therapy Treatment  Patient Details  Name: Matthew Kline MRN: [id number] Date of Birth: Aug 03, 1960 Referring Provider (PT): Meridee Score, MD   Encounter Date: 11/24/2017  PT End of Session - 11/24/17 1235    Visit Number  28    Number of Visits  42    Date for PT Re-Evaluation  12/21/17    Authorization Type  [employer]    Authorization Time Period       PT Start Time  [phone]    PT Stop Time  0930    PT Time Calculation (min)  38 min    Equipment Utilized During Treatment  Gait belt    Activity Tolerance  Patient tolerated treatment well;No increased pain    Behavior During Therapy  WFL for tasks assessed/performed       Past Medical History:  Diagnosis Date  . Anemia, unspecified   . Anxiety   . Arthralgia 2010   polyarticular  . Arthritis    "back, knees" (01/10/2017)  . Cancer [facility])    "kidney area" (01/10/2017)  . CHF (congestive heart failure) ([place]) 07/25/2009   denies  . Chronic lower back pain   . Coronary artery disease   . Coughing    pt. reports that he has drainage from sinus infection  . Diabetic foot ulcer ([place])   . Diabetic neuropathy ([place])   . ESRD (end stage renal disease) on dialysis [facility])    started 12/2012; "MWF; [place] "  (01/10/2017)  . GERD (gastroesophageal reflux disease)    hx "before I lost weight", no problem 9 years  . Hemodialysis access site with mature fistula ([place])   . Hemorrhoids, internal 10/2011   small  . High cholesterol   . History of blood transfusion    "related to the anemia"  . Hypertension   . Insomnia, unspecified   . Lacunar infarction ([place]) 2006   RUE/RLE, speech  . Long term (current) use of anticoagulants   . Myocardial infarction ([place]) 1995  . Orthostatic hypotension   . Osteomyelitis of foot, left, acute ([place])   . Other chronic  postoperative pain   . Pneumonia    "probably twice" (01/10/2017)  . Polymyalgia rheumatica ([place])   . Renal insufficiency   . Sleep apnea    "lost weight; no more problem" (01/10/2017)  . Stroke ([place]) 01/10/06   denies residual on 05/09/2014  . Type II diabetes mellitus ([place]) dx'd 1995  . Unspecified hereditary and idiopathic peripheral neuropathy    feet  . Unspecified osteomyelitis, site unspecified   . Unspecified vitamin D deficiency     Past Surgical History:  Procedure Laterality Date  . ABDOMINAL AORTOGRAM N/A 08/25/2016   Procedure: ABDOMINAL AORTOGRAM;  Surgeon: Wellington Hampshire, MD;  Location: [place] [facility];  Service: Cardiovascular;  Laterality: N/A;  . AMPUTATION  01/21/2012   Procedure: AMPUTATION RAY;  Surgeon: [name], MD;  Location: [place];  Service: Orthopedics;  Laterality: Left;  Left Foot 4th Ray Amputation  . AMPUTATION Left 05/04/2013   Procedure: AMPUTATION DIGIT;  Surgeon: [name], MD;  Location: [facility];  Service: Orthopedics;  Laterality: Left;  Left Great Toe Amputation at [facility]  . AMPUTATION Left 01/14/2017   Procedure: AMPUTATION ABOVE LEFT KNEE;  Surgeon: [name], MD;  Location: [facility];  Service: Orthopedics;  Laterality: Left;  .  ANTERIOR CERVICAL DECOMP/DISCECTOMY FUSION  02/2011  . BACK SURGERY    . BASCILIC VEIN TRANSPOSITION Left 10/19/2012   Procedure: BASCILIC VEIN TRANSPOSITION;  Surgeon: Serafina Mitchell, MD;  Location: [place];  Service: Vascular;  Laterality: Left;  . CARDIAC CATHETERIZATION     "before bypass"  . CORONARY ARTERY BYPASS GRAFT     x 5 with lima at [place] WITH ANTIBIOTIC SPACERS Left 08/07/2014   Procedure: Replace Left Total Knee Arthroplasty,  Place Antibiotic Spacer;  Surgeon: [name], MD;  Location: [facility];  Service: Orthopedics;  Laterality: Left;  . I&D EXTREMITY Left 05/09/2014   Procedure: Irrigation and Debridement Left Knee and Closure of Total Knee  Arthroplasty Incision;  Surgeon: [name], MD;  Location: [facility];  Service: Orthopedics;  Laterality: Left;  . I&D KNEE WITH POLY EXCHANGE Left 05/31/2014   Procedure: IRRIGATION AND DEBRIDEMENT LEFT KNEE, PLACE ANTIBIOTIC BEADS,  POLY EXCHANGE;  Surgeon: [name], MD;  Location: [facility];  Service: Orthopedics;  Laterality: Left;  . IRRIGATION AND DEBRIDEMENT KNEE Left 01/12/2017   Procedure: IRRIGATION AND DEBRIDEMENT LEFT KNEE;  Surgeon: [name], MD;  Location: [facility];  Service: Orthopedics;  Laterality: Left;  . JOINT REPLACEMENT    . KNEE ARTHROSCOPY Left 08-25-2012  . LOWER EXTREMITY ANGIOGRAPHY Left 08/25/2016   Procedure: Lower Extremity Angiography;  Surgeon: Wellington Hampshire, MD;  Location: [facility];  Service: Cardiovascular;  Laterality: Left;  . PERIPHERAL VASCULAR BALLOON ANGIOPLASTY Left 08/25/2016   Procedure: PERIPHERAL VASCULAR BALLOON ANGIOPLASTY;  Surgeon: Wellington Hampshire, MD;  Location: [facility];  Service: Cardiovascular;  Laterality: Left;  lt peroneal and ant tibial arteries cutting balloon  . REFRACTIVE SURGERY Bilateral   . TOE AMPUTATION Bilateral    "I've lost 7 toes over the last 7 years" (05/09/2014)  . TOE SURGERY Left April 2015   Big toe removed on left foot.  . TONSILLECTOMY    . TOTAL KNEE ARTHROPLASTY Left 04/10/2014   Procedure: TOTAL KNEE ARTHROPLASTY;  Surgeon: [name], MD;  Location: [facility];  Service: Orthopedics;  Laterality: Left;  . TOTAL KNEE REVISION Left 10/25/2014   Procedure: LEFT TOTAL KNEE REVISION;  Surgeon: [name], MD;  Location: [facility];  Service: Orthopedics;  Laterality: Left;  . TOTAL KNEE REVISION Left 11/26/2015   Procedure: Removal Left Total Knee Arthroplasty, Hinged Total Knee Arthroplasty;  Surgeon: [name], MD;  Location: [facility];  Service: Orthopedics;  Laterality: Left;  . UVULOPALATOPHARYNGOPLASTY, TONSILLECTOMY AND SEPTOPLASTY  ~ 1989  . WOUND DEBRIDEMENT Left 05/09/2014   Dehiscence  Left Total Knee Arthroplasty Incision    There were no vitals filed for this visit.  Subjective Assessment - 11/24/17 0855    Subjective  He is seeing gastroindologist today at CT scan showed thickened large intestine wall.     Pertinent History  Left TFA, ESRD with dialysis M,W,F, CHF, CAD s/p CABG 05/2016, PAD, CVA, DM2, neuropathy, arthritis, right toe amputations    Limitations  Lifting;Standing;Walking;House hold activities    Patient Stated Goals  He wants to use prosthesis to walk without assistance, garden, cook / work in [employer]     Currently in Pain?  Yes    Pain Score  4     Pain Location  Abdomen    Pain Orientation  Lower    Pain Descriptors / Indicators  Discomfort    Pain Onset  1 to 4 weeks ago    Pain Frequency  Constant    Aggravating Factors   GI issues    Pain Relieving Factors  empty stomach                       [facility] Adult PT Treatment/Exercise - 11/24/17 0940      Transfers   Transfers  Sit to Stand;Stand to Sit    Sit to Stand  5: Supervision;With upper extremity assist;From chair/3-in-1    Sit to Stand Details (indicate cue type and reason)  cues on cane placement on left to arise & wt shift to left LE (prosthesis) upon arising.     Stand to Sit  5: Supervision;With upper extremity assist;To chair/3-in-1    Stand to Sit Details  cues on cane placement on left for improved symmetrical movement      Ambulation/Gait   Ambulation/Gait  Yes    Ambulation/Gait Assistance  5: Supervision;4: Min guard    Ambulation/Gait Assistance Details  verbal cues on upright posture, wt shift over prosthesis.    Ambulation Distance (Feet)  200 Feet    Assistive device  Straight cane;Prosthesis    Gait Pattern  Step-through pattern;Decreased step length - right;Decreased stance time - left;Decreased stride length;Decreased weight shift to left;Trunk flexed;Poor foot clearance - left    Ambulation Surface  Indoor;Level    Stairs  --    Stairs Assistance  --     Stair Management Technique  --    Number of Stairs  --    Height of Stairs  --    Ramp  --    Curb  --      Prosthetics   Prosthetic Care Comments   Cues on checking liner (recommend with toileting) if liner has slid down with wear. Pt's liner was >2" slippage which impacted control of prosthesis & balance. PT recommended appt with prosthetist to determine if his liner is still proper size or has stretched, also can slip with sweating.   PT instructed with tactile & verbal cues on donning     Current prosthetic wear tolerance (days/week)   daily    Current prosthetic wear tolerance (#hours/day)   reports all awake hours with a break somedays after dialysis for napping    Residual limb condition   no issues per pt report               PT Short Term Goals - 11/22/17 0855      PT SHORT TERM GOAL #1   Title  Patient ambulates 300' cane & prosthesis with supervision.     Baseline  11/22/17: Pt ambulates 300 ft with cane and prosthesis with supervision/min guard and LOB x1     Time  4    Period  Weeks    Status  Partially Met      PT SHORT TERM GOAL #2   Title  Patient negotiates ramps & curbs with cane & prosthesis (no hand hold) with minA.    Baseline  10/22/17: Pt negotiates ramps and simulated curb with cane and min A     Time  4    Period  Weeks    Status  Achieved      PT SHORT TERM GOAL #3   Title  Patient descends stairs with 2 rails & prosthesis alternate pattern with supervision.     Baseline  11/22/2017: Pt descends stairs at supervision level with 2 rails/prosthesis.  Time  4    Period  Weeks    Status  Achieved      PT SHORT TERM GOAL #4   Title  Patient able to turn 360* slowly with prosthesis only with supervision.    Baseline  11/22/17: Pt turns 360 slowly at supervision level with prosthesis.     Time  4    Period  Weeks    Status  Achieved      PT SHORT TERM GOAL #5   Title  Patient ambulates 36' with prosthesis only carrying plate with  supervision    Baseline  11/22/17: Pt ambulates 20 ft while carrying plate upright with prosthesis only, min guard assistance.     Time  4    Period  Weeks    Status  Not Met        PT Long Term Goals - 10/06/17 1800      PT LONG TERM GOAL #1   Title  Patient verbalizes proper prosthetic care including problem solving issues and when to contact prosthetist. (All updated LTGs Target Date: 12/21/2017)    Time  3    Period  Months    Status  On-going    Target Date  12/21/17      PT LONG TERM GOAL #2   Title  Patient tolerates prosthesis wear >90% of awake hours without skin or limb pain issues to enable function throughout his day.     Time  3    Period  Months    Status  On-going    Target Date  12/21/17      PT LONG TERM GOAL #3   Title  Berg Balance with device >45/56 to indicate lower fall risk.     Time  3    Period  Months    Status  Revised    Target Date  12/21/17      PT LONG TERM GOAL #4   Title  Patient ambulates 1000' outdoors including grass, ramps & curbs with cane or less and prosthesis modified independent.     Time  3    Period  Months    Status  On-going    Target Date  12/21/17      PT LONG TERM GOAL #5   Title  Patient descends stairs with alternate pattern with single rail & cane and negotiates stairs without rails with cane step-to pattern modified independent.     Time  3    Period  Months    Status  Revised    Target Date  12/21/17      PT LONG TERM GOAL #6   Title  Patient ambulates around furniture with prosthesis only carrying light weight household items modified independent.     Time  3    Period  Months    Status  New    Target Date  12/21/17            Plan - 11/24/17 1800    Clinical Impression Statement  Patient improved understanding of donning, adjusting ply socks and sit /stand. Patient still has abdominal pain from intestinal issues which is limiting his activity.     Rehab Potential  Good    PT Frequency  2x / week     PT Duration  12 weeks    PT Treatment/Interventions  ADLs/Self Care Home Management;DME Instruction;Gait training;Stair training;Functional mobility training;Therapeutic activities;Therapeutic exercise;Balance training;Neuromuscular re-education;Canalith Repostioning;Patient/family education;Prosthetic Training;Vestibular    PT Next Visit Plan  prosthetic gait with  cane, balance activities    Consulted and Agree with Plan of Care  Patient       Patient will benefit from skilled therapeutic intervention in order to improve the following deficits and impairments:  Decreased activity tolerance, Abnormal gait, Decreased balance, Decreased endurance, Decreased knowledge of use of DME, Decreased mobility, Decreased strength, Prosthetic Dependency, Postural dysfunction, Dizziness  Visit Diagnosis: Other abnormalities of gait and mobility  Muscle weakness (generalized)  Unsteadiness on feet  Abnormal posture     Problem List Patient Active Problem List   Diagnosis Date Noted  . Altered mental status   . Cerebral thrombosis with cerebral infarction 02/06/2017  . Cerebral embolism with cerebral infarction 02/06/2017  . Above knee amputation status, left   . Pressure injury of skin 01/30/2017  . Acute lower UTI 01/29/2017  . Acute metabolic encephalopathy 16/38/4665  . UTI (urinary tract infection) 01/29/2017  . Quadriceps muscle rupture, left, initial encounter   . Fall 01/09/2017  . Gait disturbance 01/09/2017  . Staphylococcus aureus infection 01/09/2017  . Infection of prosthetic left knee joint ([place]) 01/09/2017  . Fever, unknown origin 11/09/2016  . Critical lower limb ischemia 08/25/2016  . Ulcer of left midfoot with fat layer exposed ([place]) 08/13/2016  . Diabetic ulcer of left midfoot associated with type 2 diabetes mellitus, with fat layer exposed ([place]) 07/25/2016  . Peripheral neuropathy 07/22/2016  . Tobacco abuse 07/22/2016  . CAD in native artery 05/18/2016  . CAD,  multiple vessel 05/11/2016  . Positive cardiac stress test 05/11/2016  . Abnormal stress test 04/30/2016  . Pre-transplant evaluation for kidney transplant 04/30/2016  . S/P revision of total knee 11/26/2015  . Pain in the chest   . Acute on chronic diastolic heart failure ([place]) 07/05/2015  . Volume overload 07/04/2015  . Shortness of breath 07/04/2015  . Hypoxemia 07/04/2015  . Elevated troponin   . End-stage renal disease on hemodialysis ([place])   . Hypervolemia   . 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 Pyogenic bacterial arthritis of knee, left ([place]) 08/07/2014  . Tachycardia 07/24/2014  . Acute upper respiratory infection 07/24/2014  . ESRD on dialysis ([place]) 07/14/2014  . Type II diabetes mellitus ([place]) 07/14/2014  . Anemia in chronic kidney disease 07/14/2014  . Congestive heart failure (CHF) ([place]) 07/13/2014  . Surgical wound dehiscence 05/09/2014  . Dehiscence of closure of skin 05/09/2014  . Total knee replacement status 04/10/2014  . Diabetes mellitus with renal manifestations, controlled ([place]) 10/24/2013  . Hypertensive renal disease 06/27/2013  . DM type 2 causing vascular disease ([place]) 06/27/2013  . Erectile dysfunction 06/27/2013  . Depression 06/27/2013  . Claudication of left lower extremity ([place]) 12/19/2012  . Essential hypertension, benign 12/19/2012  . Sinusitis, acute maxillary 11/22/2012  . Otitis, externa, infective 11/14/2012  . Leg edema, left 11/14/2012  . End stage renal disease ([place]) 10/02/2012  . Controlled type 2 DM with proteinuria or microalbuminuria 09/19/2012  . GERD (gastroesophageal reflux disease) 09/19/2012  . Leukocytosis 09/19/2012  . Lacunar infarction ([place]) 08/17/2012  . Polymyalgia rheumatica ([place]) 08/17/2012  . 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 Essential hypertension 05/10/2012  . Vitamin D deficiency 05/10/2012  . Diabetes mellitus due to underlying condition ([place]) 05/10/2012  . Hyperlipidemia LDL goal <100  05/10/2012  . Anemia of chronic disease 05/10/2012  . Screening for prostate cancer 05/10/2012  . Chronic kidney disease (CKD), stage IV (severe) ([place]) 05/10/2012  . Peripheral autonomic neuropathy due to DM ([place]) 05/10/2012  . Callus of foot  05/10/2012  . Urgency of urination 05/10/2012  . Hyperkalemia 05/10/2012  . Candidiasis of the esophagus 10/12/2011  . Internal hemorrhoids without mention of complication 22/48/2500  . Pre-syncope 07/25/2009  . DJD (degenerative joint disease) of cervical spine 02/17/2009    Yukari Flax PT, DPT 11/25/2017, 6:05 AM  [facility] [address] [place], [place], [address] Phone: [phone]   Fax:  [phone]  Name: Matthew Kline MRN: [id number] Date of Birth: 22-Aug-1960

## 2017-11-29 ENCOUNTER — Ambulatory Visit: Payer: Medicare HMO | Admitting: Physical Therapy

## 2017-11-30 ENCOUNTER — Encounter (HOSPITAL_COMMUNITY): Payer: Self-pay | Admitting: *Deleted

## 2017-11-30 ENCOUNTER — Emergency Department (HOSPITAL_COMMUNITY)
Admission: EM | Admit: 2017-11-30 | Discharge: 2017-11-30 | Disposition: A | Discharge status: Home / Self Care | Payer: Medicare HMO | Attending: Emergency Medicine | Admitting: Emergency Medicine

## 2017-11-30 ENCOUNTER — Emergency Department (HOSPITAL_COMMUNITY): Payer: Medicare HMO

## 2017-11-30 DIAGNOSIS — Z992 Dependence on renal dialysis: Secondary | ICD-10-CM | POA: Insufficient documentation

## 2017-11-30 DIAGNOSIS — Z89429 Acquired absence of other toe(s), unspecified side: Secondary | ICD-10-CM | POA: Diagnosis not present

## 2017-11-30 DIAGNOSIS — Z7982 Long term (current) use of aspirin: Secondary | ICD-10-CM | POA: Insufficient documentation

## 2017-11-30 DIAGNOSIS — R1030 Lower abdominal pain, unspecified: Secondary | ICD-10-CM | POA: Diagnosis present

## 2017-11-30 DIAGNOSIS — Z85528 Personal history of other malignant neoplasm of kidney: Secondary | ICD-10-CM | POA: Insufficient documentation

## 2017-11-30 DIAGNOSIS — I132 Hypertensive heart and chronic kidney disease with heart failure and with stage 5 chronic kidney disease, or end stage renal disease: Secondary | ICD-10-CM | POA: Insufficient documentation

## 2017-11-30 DIAGNOSIS — I509 Heart failure, unspecified: Secondary | ICD-10-CM | POA: Diagnosis not present

## 2017-11-30 DIAGNOSIS — N186 End stage renal disease: Secondary | ICD-10-CM | POA: Insufficient documentation

## 2017-11-30 DIAGNOSIS — R1084 Generalized abdominal pain: Secondary | ICD-10-CM

## 2017-11-30 DIAGNOSIS — Z87891 Personal history of nicotine dependence: Secondary | ICD-10-CM | POA: Insufficient documentation

## 2017-11-30 DIAGNOSIS — Z79899 Other long term (current) drug therapy: Secondary | ICD-10-CM | POA: Insufficient documentation

## 2017-11-30 DIAGNOSIS — E1122 Type 2 diabetes mellitus with diabetic chronic kidney disease: Secondary | ICD-10-CM | POA: Diagnosis not present

## 2017-11-30 DIAGNOSIS — Z96652 Presence of left artificial knee joint: Secondary | ICD-10-CM | POA: Insufficient documentation

## 2017-11-30 LAB — CBC WITH DIFFERENTIAL/PLATELET
Abs Immature Granulocytes: 0.01 10*3/uL (ref 0.00–0.07)
Basophils Absolute: 0.1 10*3/uL (ref 0.0–0.1)
Basophils Relative: 1 %
Eosinophils Absolute: 0.1 10*3/uL (ref 0.0–0.5)
Eosinophils Relative: 2 %
HCT: 35.5 % — ABNORMAL LOW (ref 39.0–52.0)
Hemoglobin: 11.3 g/dL — ABNORMAL LOW (ref 13.0–17.0)
Immature Granulocytes: 0 %
Lymphocytes Relative: 23 %
Lymphs Abs: 1.2 10*3/uL (ref 0.7–4.0)
MCH: 32 pg (ref 26.0–34.0)
MCHC: 31.8 g/dL (ref 30.0–36.0)
MCV: 100.6 fL — ABNORMAL HIGH (ref 80.0–100.0)
Monocytes Absolute: 0.7 10*3/uL (ref 0.1–1.0)
Monocytes Relative: 13 %
Neutro Abs: 3.3 10*3/uL (ref 1.7–7.7)
Neutrophils Relative %: 61 %
Platelets: 186 10*3/uL (ref 150–400)
RBC: 3.53 MIL/uL — ABNORMAL LOW (ref 4.22–5.81)
RDW: 13.2 % (ref 11.5–15.5)
WBC: 5.3 10*3/uL (ref 4.0–10.5)
nRBC: 0 % (ref 0.0–0.2)

## 2017-11-30 LAB — COMPREHENSIVE METABOLIC PANEL
ALT: 13 U/L (ref 0–44)
AST: 21 U/L (ref 15–41)
Albumin: 3.2 g/dL — ABNORMAL LOW (ref 3.5–5.0)
Alkaline Phosphatase: 52 U/L (ref 38–126)
Anion gap: 13 (ref 5–15)
BUN: 25 mg/dL — ABNORMAL HIGH (ref 6–20)
CO2: 28 mmol/L (ref 22–32)
Calcium: 9.6 mg/dL (ref 8.9–10.3)
Chloride: 94 mmol/L — ABNORMAL LOW (ref 98–111)
Creatinine, Ser: 6.95 mg/dL — ABNORMAL HIGH (ref 0.61–1.24)
GFR calc Af Amer: 9 mL/min — ABNORMAL LOW (ref >60)
GFR calc non Af Amer: 8 mL/min — ABNORMAL LOW (ref >60)
Glucose, Bld: 124 mg/dL — ABNORMAL HIGH (ref 70–99)
Potassium: 5.1 mmol/L (ref 3.5–5.1)
Sodium: 135 mmol/L (ref 135–145)
Total Bilirubin: 0.7 mg/dL (ref 0.3–1.2)
Total Protein: 7.1 g/dL (ref 6.5–8.1)

## 2017-11-30 LAB — LIPASE, BLOOD: Lipase: 40 U/L (ref 11–51)

## 2017-11-30 MED ORDER — FENTANYL CITRATE (PF) 100 MCG/2ML IJ SOLN
50.0000 ug | Freq: Once | INTRAMUSCULAR | Status: AC
  Administered 2017-11-30: 50 ug via INTRAVENOUS
  Filled 2017-11-30: qty 2

## 2017-11-30 NOTE — ED Provider Notes (Signed)
[facility] EMERGENCY DEPARTMENT Provider Note   CSN: [id number] Arrival date & time: 11/30/17  1120     History   Chief Complaint Chief Complaint  Patient presents with  . Abdominal Pain    HPI Matthew Kline is a 57 y.o. male.  HPI   57 year old male presents today with complaints of abdominal pain.  Patient notes that over the last 3 to 4 weeks he has had right sided abdominal pain.  He is followed by his primary care, he had a CT scan last week that showed likely colitis.  He was placed on Flagyl and Cipro for this.  Patient notes the pain has worsened today and radiates down into the mid lower abdomen.  Patient notes nausea no vomiting, diarrhea on Monday and a loose stool today.  He denies any fever.  Patient reports he is a dialysis patient Monday Wednesday Friday but had dialysis yesterday.  Patient notes he makes urine every other day.    Past Medical History:  Diagnosis Date  . Anemia, unspecified   . Anxiety   . Arthralgia 2010   polyarticular  . Arthritis    "back, knees" (01/10/2017)  . Cancer [facility])    "kidney area" (01/10/2017)  . CHF (congestive heart failure) ([place]) 07/25/2009   denies  . Chronic lower back pain   . Coronary artery disease   . Coughing    pt. reports that he has drainage from sinus infection  . Diabetic foot ulcer ([place])   . Diabetic neuropathy ([place])   . ESRD (end stage renal disease) on dialysis [facility])    started 12/2012; "MWF; [place] "  (01/10/2017)  . GERD (gastroesophageal reflux disease)    hx "before I lost weight", no problem 9 years  . Hemodialysis access site with mature fistula ([place])   . Hemorrhoids, internal 10/2011   small  . High cholesterol   . History of blood transfusion    "related to the anemia"  . Hypertension   . Insomnia, unspecified   . Lacunar infarction ([place]) 2006   RUE/RLE, speech  . Long term (current) use of anticoagulants   . Myocardial infarction ([place]) 1995  .  Orthostatic hypotension   . Osteomyelitis of foot, left, acute ([place])   . Other chronic postoperative pain   . Pneumonia    "probably twice" (01/10/2017)  . Polymyalgia rheumatica ([place])   . Renal insufficiency   . Sleep apnea    "lost weight; no more problem" (01/10/2017)  . Stroke ([place]) 01/10/06   denies residual on 05/09/2014  . Type II diabetes mellitus ([place]) dx'd 1995  . Unspecified hereditary and idiopathic peripheral neuropathy    feet  . Unspecified osteomyelitis, site unspecified   . Unspecified vitamin D deficiency     Patient Active Problem List   Diagnosis Date Noted  . Altered mental status   . Cerebral thrombosis with cerebral infarction 02/06/2017  . Cerebral embolism with cerebral infarction 02/06/2017  . Above knee amputation status, left   . Pressure injury of skin 01/30/2017  . Acute lower UTI 01/29/2017  . Acute metabolic encephalopathy 92/33/0076  . UTI (urinary tract infection) 01/29/2017  . Quadriceps muscle rupture, left, initial encounter   . Fall 01/09/2017  . Gait disturbance 01/09/2017  . Staphylococcus aureus infection 01/09/2017  . Infection of prosthetic left knee joint ([place]) 01/09/2017  . Fever, unknown origin 11/09/2016  . Critical lower limb ischemia 08/25/2016  . Ulcer of left midfoot with fat layer exposed (  [place]) 08/13/2016  . Diabetic ulcer of left midfoot associated with type 2 diabetes mellitus, with fat layer exposed ([place]) 07/25/2016  . Peripheral neuropathy 07/22/2016  . Tobacco abuse 07/22/2016  . CAD in native artery 05/18/2016  . CAD, multiple vessel 05/11/2016  . Positive cardiac stress test 05/11/2016  . Abnormal stress test 04/30/2016  . Pre-transplant evaluation for kidney transplant 04/30/2016  . S/P revision of total knee 11/26/2015  . Pain in the chest   . Acute on chronic diastolic heart failure ([place]) 07/05/2015  . Volume overload 07/04/2015  . Shortness of breath 07/04/2015  . Hypoxemia 07/04/2015  . Elevated troponin    . End-stage renal disease on hemodialysis ([place])   . Hypervolemia   . 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 Pyogenic bacterial arthritis of knee, left ([place]) 08/07/2014  . Tachycardia 07/24/2014  . Acute upper respiratory infection 07/24/2014  . ESRD on dialysis ([place]) 07/14/2014  . Type II diabetes mellitus ([place]) 07/14/2014  . Anemia in chronic kidney disease 07/14/2014  . Congestive heart failure (CHF) ([place]) 07/13/2014  . Surgical wound dehiscence 05/09/2014  . Dehiscence of closure of skin 05/09/2014  . Total knee replacement status 04/10/2014  . Diabetes mellitus with renal manifestations, controlled ([place]) 10/24/2013  . Hypertensive renal disease 06/27/2013  . DM type 2 causing vascular disease ([place]) 06/27/2013  . Erectile dysfunction 06/27/2013  . Depression 06/27/2013  . Claudication of left lower extremity ([place]) 12/19/2012  . Essential hypertension, benign 12/19/2012  . Sinusitis, acute maxillary 11/22/2012  . Otitis, externa, infective 11/14/2012  . Leg edema, left 11/14/2012  . End stage renal disease ([place]) 10/02/2012  . Controlled type 2 DM with proteinuria or microalbuminuria 09/19/2012  . GERD (gastroesophageal reflux disease) 09/19/2012  . Leukocytosis 09/19/2012  . Lacunar infarction ([place]) 08/17/2012  . Polymyalgia rheumatica ([place]) 08/17/2012  . 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 Essential hypertension 05/10/2012  . Vitamin D deficiency 05/10/2012  . Diabetes mellitus due to underlying condition ([place]) 05/10/2012  . Hyperlipidemia LDL goal <100 05/10/2012  . Anemia of chronic disease 05/10/2012  . Screening for prostate cancer 05/10/2012  . Chronic kidney disease (CKD), stage IV (severe) ([place]) 05/10/2012  . Peripheral autonomic neuropathy due to DM ([place]) 05/10/2012  . Callus of foot 05/10/2012  . Urgency of urination 05/10/2012  . Hyperkalemia 05/10/2012  . Candidiasis of the esophagus 10/12/2011  . Internal hemorrhoids without mention of  complication 11/94/1740  . Pre-syncope 07/25/2009  . DJD (degenerative joint disease) of cervical spine 02/17/2009    Past Surgical History:  Procedure Laterality Date  . ABDOMINAL AORTOGRAM N/A 08/25/2016   Procedure: ABDOMINAL AORTOGRAM;  Surgeon: Wellington Hampshire, MD;  Location: [facility];  Service: Cardiovascular;  Laterality: N/A;  . AMPUTATION  01/21/2012   Procedure: AMPUTATION RAY;  Surgeon: [name], MD;  Location: [facility];  Service: Orthopedics;  Laterality: Left;  Left Foot 4th Ray Amputation  . AMPUTATION Left 05/04/2013   Procedure: AMPUTATION DIGIT;  Surgeon: [name], MD;  Location: [facility];  Service: Orthopedics;  Laterality: Left;  Left Great Toe Amputation at [facility]  . AMPUTATION Left 01/14/2017   Procedure: AMPUTATION ABOVE LEFT KNEE;  Surgeon: [name], MD;  Location: [facility];  Service: Orthopedics;  Laterality: Left;  . ANTERIOR CERVICAL DECOMP/DISCECTOMY FUSION  02/2011  . BACK SURGERY    . BASCILIC VEIN TRANSPOSITION Left 10/19/2012   Procedure: BASCILIC VEIN TRANSPOSITION;  Surgeon: Serafina Mitchell, MD;  Location: [facility];  Service: Vascular;  Laterality: Left;  . CARDIAC CATHETERIZATION     "before bypass"  . CORONARY ARTERY BYPASS GRAFT     x 5 with lima at [place] WITH ANTIBIOTIC SPACERS Left 08/07/2014   Procedure: Replace Left Total Knee Arthroplasty,  Place Antibiotic Spacer;  Surgeon: [name], MD;  Location: [facility];  Service: Orthopedics;  Laterality: Left;  . I&D EXTREMITY Left 05/09/2014   Procedure: Irrigation and Debridement Left Knee and Closure of Total Knee Arthroplasty Incision;  Surgeon: [name], MD;  Location: [place];  Service: Orthopedics;  Laterality: Left;  . I&D KNEE WITH POLY EXCHANGE Left 05/31/2014   Procedure: IRRIGATION AND DEBRIDEMENT LEFT KNEE, PLACE ANTIBIOTIC BEADS,  POLY EXCHANGE;  Surgeon: [name], MD;  Location: [facility];  Service: Orthopedics;  Laterality: Left;  .  IRRIGATION AND DEBRIDEMENT KNEE Left 01/12/2017   Procedure: IRRIGATION AND DEBRIDEMENT LEFT KNEE;  Surgeon: [name], MD;  Location: [facility];  Service: Orthopedics;  Laterality: Left;  . JOINT REPLACEMENT    . KNEE ARTHROSCOPY Left 08-25-2012  . LOWER EXTREMITY ANGIOGRAPHY Left 08/25/2016   Procedure: Lower Extremity Angiography;  Surgeon: Wellington Hampshire, MD;  Location: [facility];  Service: Cardiovascular;  Laterality: Left;  . PERIPHERAL VASCULAR BALLOON ANGIOPLASTY Left 08/25/2016   Procedure: PERIPHERAL VASCULAR BALLOON ANGIOPLASTY;  Surgeon: Wellington Hampshire, MD;  Location: [facility];  Service: Cardiovascular;  Laterality: Left;  lt peroneal and ant tibial arteries cutting balloon  . REFRACTIVE SURGERY Bilateral   . TOE AMPUTATION Bilateral    "I've lost 7 toes over the last 7 years" (05/09/2014)  . TOE SURGERY Left April 2015   Big toe removed on left foot.  . TONSILLECTOMY    . TOTAL KNEE ARTHROPLASTY Left 04/10/2014   Procedure: TOTAL KNEE ARTHROPLASTY;  Surgeon: [name], MD;  Location: [facility];  Service: Orthopedics;  Laterality: Left;  . TOTAL KNEE REVISION Left 10/25/2014   Procedure: LEFT TOTAL KNEE REVISION;  Surgeon: [name], MD;  Location: [facility];  Service: Orthopedics;  Laterality: Left;  . TOTAL KNEE REVISION Left 11/26/2015   Procedure: Removal Left Total Knee Arthroplasty, Hinged Total Knee Arthroplasty;  Surgeon: [name], MD;  Location: [facility];  Service: Orthopedics;  Laterality: Left;  . UVULOPALATOPHARYNGOPLASTY, TONSILLECTOMY AND SEPTOPLASTY  ~ 1989  . WOUND DEBRIDEMENT Left 05/09/2014   Dehiscence Left Total Knee Arthroplasty Incision        Home Medications    Prior to Admission medications   Medication Sig Start Date End Date Taking? Authorizing Provider  carvedilol (COREG) 12.5 MG tablet Take 1 tablet (12.5 mg total) by mouth 2 (two) times daily with a meal. Patient taking differently: Take 6.25 mg by mouth 2 (two) times  daily with a meal.  02/08/17  Yes Bonnell Public, MD  ciprofloxacin (CIPRO) 250 MG tablet Take 250 mg by mouth daily.   Yes [provider]  gabapentin (NEURONTIN) 300 MG capsule TAKE 1 CAPSULE BY MOUTH THREE TIMES A DAY Patient taking differently: Take 300 mg by mouth 3 (three) times daily.  10/24/17  Yes [name], MD  metroNIDAZOLE (FLAGYL) 500 MG tablet Take 500 mg by mouth 3 (three) times daily.   Yes [provider]  montelukast (SINGULAIR) 10 MG tablet TAKE 1 TABLET (10 MG TOTAL) BY MOUTH AT BEDTIME. Patient taking differently: Take 10 mg by mouth at bedtime.  09/01/15  Yes Gildardo Cranker, DO  pentoxifylline (TRENTAL) 400 MG CR tablet TAKE 1 TABLET (400 MG TOTAL) BY MOUTH 3 (THREE) TIMES DAILY WITH MEALS. 11/24/17  Yes [name], MD  sevelamer carbonate (RENVELA) 800 MG tablet Take 800-2,400 mg by mouth 3 (three) times daily with meals.   Yes [provider]  temazepam (RESTORIL) 15 MG capsule Take 30 mg by mouth at bedtime as needed for sleep.  02/28/17  Yes [provider]  Amino Acids-Protein Hydrolys (FEEDING SUPPLEMENT, PRO-STAT SUGAR FREE 64,) LIQD Take 30 mLs by mouth 2 (two) times daily. Patient not taking: Reported on 11/30/2017 01/19/17   Patrecia Pour, Christean Grief, MD  amLODipine (NORVASC) 5 MG tablet Take 5 mg by mouth daily.     [provider]  aspirin EC 81 MG EC tablet Take 2 tablets (162 mg total) by mouth daily. Patient not taking: Reported on 11/30/2017 01/20/17   Patrecia Pour, Christean Grief, MD  Darbepoetin Alfa (ARANESP) 150 MCG/0.3ML SOSY injection Inject 0.3 mLs (150 mcg total) into the vein every Friday with hemodialysis. 02/11/17   Bonnell Public, MD  ferric citrate (AURYXIA) 1 GM 210 MG(Fe) tablet Take 2 tablets (420 mg total) by mouth 3 (three) times daily with meals. Patient not taking: Reported on 11/30/2017 02/08/17   Dana Allan I, MD  multivitamin (RENA-VIT) TABS tablet Take 1 tablet by mouth at bedtime. Patient  not taking: Reported on 11/30/2017 02/08/17   Bonnell Public, MD  Nutritional Supplements (FEEDING SUPPLEMENT, NEPRO CARB STEADY,) LIQD Take 237 mLs by mouth 2 (two) times daily between meals. Patient not taking: Reported on 11/30/2017 01/19/17   Doreatha Lew, MD    Family History Family History  Problem Relation Age of Onset  . Hypertension Mother   . Cancer Mother 65       Ovarian  . Heart disease Maternal Aunt   . Stroke Maternal Grandfather     Social History Social History   Tobacco Use  . Smoking status: Former Smoker    Packs/day: 0.12    Years: 32.00    Pack years: 3.84    Types: Cigarettes    Last attempt to quit: 10/05/2016    Years since quitting: 1.1  . Smokeless tobacco: Never Used  Substance Use Topics  . Alcohol use: No    Alcohol/week: 0.0 standard drinks  . Drug use: No     Allergies   Morphine and related and Tygacil [tigecycline]   Review of Systems Review of Systems  All other systems reviewed and are negative.    Physical Exam Updated Vital Signs BP (!) 158/94 (BP Location: Right Arm)   Pulse 91   Temp 98 F (36.7 C) (Oral)   Resp 16   SpO2 98%   Physical Exam  Constitutional: He is oriented to person, place, and time. He appears well-developed and well-nourished.  HENT:  Head: Normocephalic and atraumatic.  Eyes: Pupils are equal, round, and reactive to light. Conjunctivae are normal. Right eye exhibits no discharge. Left eye exhibits no discharge. No scleral icterus.  Neck: Normal range of motion. No JVD present. No tracheal deviation present.  Pulmonary/Chest: Effort normal. No stridor.  Abdominal:  Exquisite tenderness throughout the abdomen worse in the lower quadrants-no masses rebound or guarding  Neurological: He is alert and oriented to person, place, and time. Coordination normal.  Psychiatric: He has a normal mood and affect. His behavior is normal. Judgment and thought content normal.  Nursing note and vitals  reviewed.  ED Treatments / Results  Labs (all labs ordered are listed, but only abnormal results are displayed) Labs Reviewed  CBC WITH DIFFERENTIAL/PLATELET - Abnormal; Notable for the following components:      Result Value   RBC 3.53 (*)    Hemoglobin 11.3 (*)    HCT 35.5 (*)    MCV 100.6 (*)    All other components within normal limits  COMPREHENSIVE METABOLIC PANEL - Abnormal; Notable for the following components:   Chloride 94 (*)    Glucose, Bld 124 (*)    BUN 25 (*)    Creatinine, Ser 6.95 (*)    Albumin 3.2 (*)    GFR calc non Af Amer 8 (*)    GFR calc Af Amer 9 (*)    All other components within normal limits  LIPASE, BLOOD    EKG None  Radiology Ct Abdomen Pelvis Wo Contrast  Result Date: 11/30/2017 CLINICAL DATA:  Acute abdominal pain, nausea, and vomiting for 3 weeks, significantly worse today, worsened pain with movement, history end-stage renal disease on dialysis, CHF, stroke, coronary artery disease post MI, GERD, hypertension, type II diabetes mellitus EXAM: CT ABDOMEN AND PELVIS WITHOUT CONTRAST TECHNIQUE: Multidetector CT imaging of the abdomen and pelvis was performed following the standard protocol without IV contrast. Sagittal and coronal MPR images reconstructed from axial data set. COMPARISON:  None FINDINGS: Lower chest: Subsegmental atelectasis BILATERAL lower lobes Hepatobiliary: Small dependent calculi within gallbladder. Liver unremarkable. No biliary dilatation. Pancreas: Normal appearance Spleen: Normal appearance Adrenals/Urinary Tract: Adrenal glands normal appearance. Atrophic kidneys consistent with end-stage renal disease. Renal vascular calcifications. Probable small calculus mid RIGHT kidney, nonobstructing. No definite renal mass lesion. No hydronephrosis or hydroureter. Bladder unremarkable. Stomach/Bowel: Cecum located anterior to the liver. Appendix not visualized. Stomach and bowel loops otherwise normal appearance. Calcification  anterior to the sigmoid colon likely reflects sequela of prior fat necrosis. Vascular/Lymphatic: Extensive atherosclerotic calcifications of aorta, iliac arteries, visceral arteries, and coronary arteries. Aorta normal caliber. No adenopathy. Reproductive: Unremarkable Other: BILATERAL inguinal hernias containing fat. No free air or free fluid. No acute inflammatory process. Musculoskeletal: Unremarkable IMPRESSION: Atrophic kidneys with questionable nonobstructing mid RIGHT renal calculus. Cholelithiasis. Extensive atherosclerotic calcifications including aorta and coronary arteries. BILATERAL inguinal hernias containing fat. No acute abnormalities. Electronically Signed   By: Lavonia Dana M.D.   On: 11/30/2017 14:34    Procedures Procedures (including critical care time)  Medications Ordered in ED Medications  fentaNYL (SUBLIMAZE) injection 50 mcg (has no administration in time range)  fentaNYL (SUBLIMAZE) injection 50 mcg (50 mcg Intravenous Given 11/30/17 1306)     Initial Impression / Assessment and Plan / ED Course  I have reviewed the triage vital signs and the nursing notes.  Pertinent labs & imaging results that were available during my care of the patient were reviewed by me and considered in my medical decision making (see chart for details).     Labs: CBC, CMP, lipase  Imaging: CT abdomen pelvis with contrast  Consults:  Therapeutics: Fentanyl  Discharge Meds:   Assessment/Plan: 57 year old male presents today with complaints of abdominal pain.  Patient has very reassuring work-up with no fever tachycardia elevated white count or significant abnormalities noted on CT scan.  Uncertain etiology, low suspicion for intra-abdominal infection, dissection, or any other life-threatening etiology.  Patient discharged with primary care follow-up, he will follow-up in 2 days if symptoms persist return immediately if they worsen.  He verbalized understanding and agreement to today's plan  had no further  questions concerns.    Final Clinical Impressions(s) / ED Diagnoses   Final diagnoses:  Generalized abdominal pain    ED Discharge Orders    None       Okey Regal, PA-C 11/30/17 [place], Adam, DO 11/30/17 1513

## 2017-11-30 NOTE — ED Notes (Signed)
Pt states he is unable to produce urine very often - not able to obtain urine sample.

## 2017-11-30 NOTE — ED Notes (Signed)
Patient given discharge instructions and verbalized understanding.  Patient stable to discharge at this time.  Patient is alert and oriented to baseline.  No distressed noted at this time.  All belongings taken with the patient at discharge.   

## 2017-11-30 NOTE — Discharge Instructions (Addendum)
Please read attached information. If you experience any new or worsening signs or symptoms please return to the emergency room for evaluation. Please follow-up with your primary care provider or specialist as discussed.  °

## 2017-11-30 NOTE — ED Triage Notes (Signed)
Pt in c/o abdominal pain with n/v, initial onset was 3 weeks ago and significantly worse today, worse with movement, given fentanyl 100mg  PTA, last dialysis treatment was yesterday

## 2017-12-01 ENCOUNTER — Ambulatory Visit: Payer: Medicare HMO | Admitting: Physical Therapy

## 2017-12-06 ENCOUNTER — Ambulatory Visit: Payer: Medicare HMO | Admitting: Physical Therapy

## 2017-12-07 ENCOUNTER — Ambulatory Visit: Payer: Medicare HMO | Admitting: Physical Therapy

## 2017-12-07 ENCOUNTER — Encounter: Payer: Self-pay | Admitting: Physical Therapy

## 2017-12-07 DIAGNOSIS — R2689 Other abnormalities of gait and mobility: Secondary | ICD-10-CM

## 2017-12-07 DIAGNOSIS — R293 Abnormal posture: Secondary | ICD-10-CM

## 2017-12-07 DIAGNOSIS — M6281 Muscle weakness (generalized): Secondary | ICD-10-CM | POA: Diagnosis not present

## 2017-12-07 DIAGNOSIS — R2681 Unsteadiness on feet: Secondary | ICD-10-CM

## 2017-12-08 NOTE — Therapy (Signed)
[facility] [address] [place], [place], [address] Phone: [phone]   Fax:  [phone]  Physical Therapy Treatment  Patient Details  Name: Matthew Kline MRN: [id number] Date of Birth: 1960-11-17 Referring Provider (PT): Meridee Score, MD   Encounter Date: 12/07/2017  PT End of Session - 12/07/17 1041    Visit Number  29    Number of Visits  42    Date for PT Re-Evaluation  12/21/17    Authorization Type  [employer]    Authorization Time Period       PT Start Time  0847    PT Stop Time  0933    PT Time Calculation (min)  46 min    Equipment Utilized During Treatment  Gait belt    Activity Tolerance  Patient tolerated treatment well;No increased pain    Behavior During Therapy  WFL for tasks assessed/performed       Past Medical History:  Diagnosis Date  . Anemia, unspecified   . Anxiety   . Arthralgia 2010   polyarticular  . Arthritis    "back, knees" (01/10/2017)  . Cancer [facility])    "kidney area" (01/10/2017)  . CHF (congestive heart failure) ([place]) 07/25/2009   denies  . Chronic lower back pain   . Coronary artery disease   . Coughing    pt. reports that he has drainage from sinus infection  . Diabetic foot ulcer ([place])   . Diabetic neuropathy ([place])   . ESRD (end stage renal disease) on dialysis [facility])    started 12/2012; "MWF; [facility] "  (01/10/2017)  . GERD (gastroesophageal reflux disease)    hx "before I lost weight", no problem 9 years  . Hemodialysis access site with mature fistula ([place])   . Hemorrhoids, internal 10/2011   small  . High cholesterol   . History of blood transfusion    "related to the anemia"  . Hypertension   . Insomnia, unspecified   . Lacunar infarction ([place]) 2006   RUE/RLE, speech  . Long term (current) use of anticoagulants   . Myocardial infarction ([place]) 1995  . Orthostatic hypotension   . Osteomyelitis of foot, left, acute ([place])   . Other chronic  postoperative pain   . Pneumonia    "probably twice" (01/10/2017)  . Polymyalgia rheumatica ([place])   . Renal insufficiency   . Sleep apnea    "lost weight; no more problem" (01/10/2017)  . Stroke ([place]) 01/10/06   denies residual on 05/09/2014  . Type II diabetes mellitus ([place]) dx'd 1995  . Unspecified hereditary and idiopathic peripheral neuropathy    feet  . Unspecified osteomyelitis, site unspecified   . Unspecified vitamin D deficiency     Past Surgical History:  Procedure Laterality Date  . ABDOMINAL AORTOGRAM N/A 08/25/2016   Procedure: ABDOMINAL AORTOGRAM;  Surgeon: Wellington Hampshire, MD;  Location: [place] [facility];  Service: Cardiovascular;  Laterality: N/A;  . AMPUTATION  01/21/2012   Procedure: AMPUTATION RAY;  Surgeon: [name], MD;  Location: [facility];  Service: Orthopedics;  Laterality: Left;  Left Foot 4th Ray Amputation  . AMPUTATION Left 05/04/2013   Procedure: AMPUTATION DIGIT;  Surgeon: [name], MD;  Location: [facility];  Service: Orthopedics;  Laterality: Left;  Left Great Toe Amputation at [facility]  . AMPUTATION Left 01/14/2017   Procedure: AMPUTATION ABOVE LEFT KNEE;  Surgeon: [name], MD;  Location: [facility];  Service: Orthopedics;  Laterality: Left;  .  ANTERIOR CERVICAL DECOMP/DISCECTOMY FUSION  02/2011  . BACK SURGERY    . BASCILIC VEIN TRANSPOSITION Left 10/19/2012   Procedure: BASCILIC VEIN TRANSPOSITION;  Surgeon: Serafina Mitchell, MD;  Location: [facility];  Service: Vascular;  Laterality: Left;  . CARDIAC CATHETERIZATION     "before bypass"  . CORONARY ARTERY BYPASS GRAFT     x 5 with lima at [place] WITH ANTIBIOTIC SPACERS Left 08/07/2014   Procedure: Replace Left Total Knee Arthroplasty,  Place Antibiotic Spacer;  Surgeon: [name], MD;  Location: [facility];  Service: Orthopedics;  Laterality: Left;  . I&D EXTREMITY Left 05/09/2014   Procedure: Irrigation and Debridement Left Knee and Closure of Total Knee  Arthroplasty Incision;  Surgeon: [name], MD;  Location: [facility];  Service: Orthopedics;  Laterality: Left;  . I&D KNEE WITH POLY EXCHANGE Left 05/31/2014   Procedure: IRRIGATION AND DEBRIDEMENT LEFT KNEE, PLACE ANTIBIOTIC BEADS,  POLY EXCHANGE;  Surgeon: [name], MD;  Location: [facility];  Service: Orthopedics;  Laterality: Left;  . IRRIGATION AND DEBRIDEMENT KNEE Left 01/12/2017   Procedure: IRRIGATION AND DEBRIDEMENT LEFT KNEE;  Surgeon: [name], MD;  Location: [facility];  Service: Orthopedics;  Laterality: Left;  . JOINT REPLACEMENT    . KNEE ARTHROSCOPY Left 08-25-2012  . LOWER EXTREMITY ANGIOGRAPHY Left 08/25/2016   Procedure: Lower Extremity Angiography;  Surgeon: Wellington Hampshire, MD;  Location: [place] [facility];  Service: Cardiovascular;  Laterality: Left;  . PERIPHERAL VASCULAR BALLOON ANGIOPLASTY Left 08/25/2016   Procedure: PERIPHERAL VASCULAR BALLOON ANGIOPLASTY;  Surgeon: Wellington Hampshire, MD;  Location: [place] [facility];  Service: Cardiovascular;  Laterality: Left;  lt peroneal and ant tibial arteries cutting balloon  . REFRACTIVE SURGERY Bilateral   . TOE AMPUTATION Bilateral    "I've lost 7 toes over the last 7 years" (05/09/2014)  . TOE SURGERY Left April 2015   Big toe removed on left foot.  . TONSILLECTOMY    . TOTAL KNEE ARTHROPLASTY Left 04/10/2014   Procedure: TOTAL KNEE ARTHROPLASTY;  Surgeon: [name], MD;  Location: [facility];  Service: Orthopedics;  Laterality: Left;  . TOTAL KNEE REVISION Left 10/25/2014   Procedure: LEFT TOTAL KNEE REVISION;  Surgeon: [name], MD;  Location: [facility];  Service: Orthopedics;  Laterality: Left;  . TOTAL KNEE REVISION Left 11/26/2015   Procedure: Removal Left Total Knee Arthroplasty, Hinged Total Knee Arthroplasty;  Surgeon: [name], MD;  Location: [place];  Service: Orthopedics;  Laterality: Left;  . UVULOPALATOPHARYNGOPLASTY, TONSILLECTOMY AND SEPTOPLASTY  ~ 1989  . WOUND DEBRIDEMENT Left 05/09/2014   Dehiscence  Left Total Knee Arthroplasty Incision    There were no vitals filed for this visit.  Subjective Assessment - 12/07/17 0850    Subjective  He has colonscopy Dec 19. Medications have improved pain for now.     Pertinent History  Left TFA, ESRD with dialysis M,W,F, CHF, CAD s/p CABG 05/2016, PAD, CVA, DM2, neuropathy, arthritis, right toe amputations    Limitations  Lifting;Standing;Walking;House hold activities    Patient Stated Goals  He wants to use prosthesis to walk without assistance, garden, cook / work in [employer]     Currently in Pain?  Yes    Pain Score  0-No pain   in last 48 hrs, up to 3-4/10   Pain Location  Abdomen    Pain Onset  1 to 4 weeks ago  [facility] Adult PT Treatment/Exercise - 12/07/17 0850      Transfers   Transfers  Sit to Stand;Stand to Sit    Sit to Stand  5: Supervision;With upper extremity assist;From chair/3-in-1    Stand to Sit  5: Supervision;With upper extremity assist;To chair/3-in-1      Ambulation/Gait   Ambulation/Gait  Yes    Ambulation/Gait Assistance  5: Supervision;4: Min guard    Ambulation/Gait Assistance Details  worked inside parallel bars without UE support / intermittent touch needed with mirrors for visual feedback, demo, verbal cues on step width, upright posture, and prosthesis control with pelvis.     Ambulation Distance (Feet)  200 Feet   multiple laps over 20 minutes   Assistive device  Straight cane;Prosthesis    Gait Pattern  Step-through pattern;Decreased step length - right;Decreased stance time - left;Decreased stride length;Decreased weight shift to left;Trunk flexed;Poor foot clearance - left    Ambulation Surface  Indoor;Level    Gait Comments  worked on 39* turn right & left and ambulating in bars      High Level Balance   High Level Balance Activities  Side stepping;Backward walking    High Level Balance Comments  demo, tactile & verbal cues on technique with TFA prosthesis: near parallel  bars initially and progressed near counter with chair backs near to enable work at home.       Prosthetics   Prosthetic Care Comments   --    Current prosthetic wear tolerance (days/week)   daily    Current prosthetic wear tolerance (#hours/day)   reports all awake hours with a break somedays after dialysis for napping    Residual limb condition   no issues per pt report               PT Short Term Goals - 11/22/17 0855      PT SHORT TERM GOAL #1   Title  Patient ambulates 300' cane & prosthesis with supervision.     Baseline  11/22/17: Pt ambulates 300 ft with cane and prosthesis with supervision/min guard and LOB x1     Time  4    Period  Weeks    Status  Partially Met      PT SHORT TERM GOAL #2   Title  Patient negotiates ramps & curbs with cane & prosthesis (no hand hold) with minA.    Baseline  10/22/17: Pt negotiates ramps and simulated curb with cane and min A     Time  4    Period  Weeks    Status  Achieved      PT SHORT TERM GOAL #3   Title  Patient descends stairs with 2 rails & prosthesis alternate pattern with supervision.     Baseline  11/22/2017: Pt descends stairs at supervision level with 2 rails/prosthesis.     Time  4    Period  Weeks    Status  Achieved      PT SHORT TERM GOAL #4   Title  Patient able to turn 360* slowly with prosthesis only with supervision.    Baseline  11/22/17: Pt turns 360 slowly at supervision level with prosthesis.     Time  4    Period  Weeks    Status  Achieved      PT SHORT TERM GOAL #5   Title  Patient ambulates 69' with prosthesis only carrying plate with supervision    Baseline  11/22/17: Pt ambulates 20 ft while carrying plate  upright with prosthesis only, min guard assistance.     Time  4    Period  Weeks    Status  Not Met        PT Long Term Goals - 10/06/17 1800      PT LONG TERM GOAL #1   Title  Patient verbalizes proper prosthetic care including problem solving issues and when to contact prosthetist.  (All updated LTGs Target Date: 12/21/2017)    Time  3    Period  Months    Status  On-going    Target Date  12/21/17      PT LONG TERM GOAL #2   Title  Patient tolerates prosthesis wear >90% of awake hours without skin or limb pain issues to enable function throughout his day.     Time  3    Period  Months    Status  On-going    Target Date  12/21/17      PT LONG TERM GOAL #3   Title  Berg Balance with device >45/56 to indicate lower fall risk.     Time  3    Period  Months    Status  Revised    Target Date  12/21/17      PT LONG TERM GOAL #4   Title  Patient ambulates 1000' outdoors including grass, ramps & curbs with cane or less and prosthesis modified independent.     Time  3    Period  Months    Status  On-going    Target Date  12/21/17      PT LONG TERM GOAL #5   Title  Patient descends stairs with alternate pattern with single rail & cane and negotiates stairs without rails with cane step-to pattern modified independent.     Time  3    Period  Months    Status  Revised    Target Date  12/21/17      PT LONG TERM GOAL #6   Title  Patient ambulates around furniture with prosthesis only carrying light weight household items modified independent.     Time  3    Period  Months    Status  New    Target Date  12/21/17            Plan - 12/07/17 1200    Clinical Impression Statement  Patient improved ability to ambulate without cane but requires intermittent touch on parallel bars or counter top. PT also instructed in sidestepping, backwards and turning 90*. He seems to understand how to safely work on these skills at home.     Rehab Potential  Good    PT Frequency  2x / week    PT Duration  12 weeks    PT Treatment/Interventions  ADLs/Self Care Home Management;DME Instruction;Gait training;Stair training;Functional mobility training;Therapeutic activities;Therapeutic exercise;Balance training;Neuromuscular re-education;Canalith Repostioning;Patient/family  education;Prosthetic Training;Vestibular    PT Next Visit Plan  prosthetic gait with cane, balance activities    Consulted and Agree with Plan of Care  Patient       Patient will benefit from skilled therapeutic intervention in order to improve the following deficits and impairments:  Decreased activity tolerance, Abnormal gait, Decreased balance, Decreased endurance, Decreased knowledge of use of DME, Decreased mobility, Decreased strength, Prosthetic Dependency, Postural dysfunction, Dizziness  Visit Diagnosis: Other abnormalities of gait and mobility  Muscle weakness (generalized)  Unsteadiness on feet  Abnormal posture     Problem List Patient Active Problem List   Diagnosis Date  Noted  . Altered mental status   . Cerebral thrombosis with cerebral infarction 02/06/2017  . Cerebral embolism with cerebral infarction 02/06/2017  . Above knee amputation status, left   . Pressure injury of skin 01/30/2017  . Acute lower UTI 01/29/2017  . Acute metabolic encephalopathy 23/30/0762  . UTI (urinary tract infection) 01/29/2017  . Quadriceps muscle rupture, left, initial encounter   . Fall 01/09/2017  . Gait disturbance 01/09/2017  . Staphylococcus aureus infection 01/09/2017  . Infection of prosthetic left knee joint ([place]) 01/09/2017  . Fever, unknown origin 11/09/2016  . Critical lower limb ischemia 08/25/2016  . Ulcer of left midfoot with fat layer exposed ([place]) 08/13/2016  . Diabetic ulcer of left midfoot associated with type 2 diabetes mellitus, with fat layer exposed ([place]) 07/25/2016  . Peripheral neuropathy 07/22/2016  . Tobacco abuse 07/22/2016  . CAD in native artery 05/18/2016  . CAD, multiple vessel 05/11/2016  . Positive cardiac stress test 05/11/2016  . Abnormal stress test 04/30/2016  . Pre-transplant evaluation for kidney transplant 04/30/2016  . S/P revision of total knee 11/26/2015  . Pain in the chest   . Acute on chronic diastolic heart failure ([place])  07/05/2015  . Volume overload 07/04/2015  . Shortness of breath 07/04/2015  . Hypoxemia 07/04/2015  . Elevated troponin   . End-stage renal disease on hemodialysis ([place])   . Hypervolemia   . 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 Pyogenic bacterial arthritis of knee, left ([place]) 08/07/2014  . Tachycardia 07/24/2014  . Acute upper respiratory infection 07/24/2014  . ESRD on dialysis ([place]) 07/14/2014  . Type II diabetes mellitus ([place]) 07/14/2014  . Anemia in chronic kidney disease 07/14/2014  . Congestive heart failure (CHF) ([place]) 07/13/2014  . Surgical wound dehiscence 05/09/2014  . Dehiscence of closure of skin 05/09/2014  . Total knee replacement status 04/10/2014  . Diabetes mellitus with renal manifestations, controlled ([place]) 10/24/2013  . Hypertensive renal disease 06/27/2013  . DM type 2 causing vascular disease ([place]) 06/27/2013  . Erectile dysfunction 06/27/2013  . Depression 06/27/2013  . Claudication of left lower extremity ([place]) 12/19/2012  . Essential hypertension, benign 12/19/2012  . Sinusitis, acute maxillary 11/22/2012  . Otitis, externa, infective 11/14/2012  . Leg edema, left 11/14/2012  . End stage renal disease ([place]) 10/02/2012  . Controlled type 2 DM with proteinuria or microalbuminuria 09/19/2012  . GERD (gastroesophageal reflux disease) 09/19/2012  . Leukocytosis 09/19/2012  . Lacunar infarction ([place]) 08/17/2012  . Polymyalgia rheumatica ([place]) 08/17/2012  . 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 Essential hypertension 05/10/2012  . Vitamin D deficiency 05/10/2012  . Diabetes mellitus due to underlying condition ([place]) 05/10/2012  . Hyperlipidemia LDL goal <100 05/10/2012  . Anemia of chronic disease 05/10/2012  . Screening for prostate cancer 05/10/2012  . Chronic kidney disease (CKD), stage IV (severe) ([place]) 05/10/2012  . Peripheral autonomic neuropathy due to DM ([place]) 05/10/2012  . Callus of foot 05/10/2012  . Urgency of urination  05/10/2012  . Hyperkalemia 05/10/2012  . Candidiasis of the esophagus 10/12/2011  . Internal hemorrhoids without mention of complication 26/33/3545  . Pre-syncope 07/25/2009  . DJD (degenerative joint disease) of cervical spine 02/17/2009    , PT, DPT 12/08/2017, 11:21 AM  [facility] [address] [place], [place], [address] Phone: [phone]   Fax:  [phone]  Name: Matthew Kline MRN: [id number] Date of Birth: 11/20/1960

## 2017-12-13 ENCOUNTER — Ambulatory Visit: Payer: Medicare HMO | Attending: Orthopedic Surgery | Admitting: Physical Therapy

## 2017-12-13 ENCOUNTER — Encounter: Payer: Self-pay | Admitting: Physical Therapy

## 2017-12-13 DIAGNOSIS — Z9181 History of falling: Secondary | ICD-10-CM | POA: Insufficient documentation

## 2017-12-13 DIAGNOSIS — R293 Abnormal posture: Secondary | ICD-10-CM | POA: Diagnosis present

## 2017-12-13 DIAGNOSIS — R2681 Unsteadiness on feet: Secondary | ICD-10-CM | POA: Insufficient documentation

## 2017-12-13 DIAGNOSIS — M6281 Muscle weakness (generalized): Secondary | ICD-10-CM

## 2017-12-13 DIAGNOSIS — R2689 Other abnormalities of gait and mobility: Secondary | ICD-10-CM | POA: Diagnosis not present

## 2017-12-14 NOTE — Therapy (Signed)
[facility] [address] [place], [place], [address] Phone: [phone]   Fax:  [phone]  Physical Therapy Treatment & Progress Note  Patient Details  Name: Matthew Kline MRN: [id number] Date of Birth: [birth year] Referring Provider (PT): Meridee Score, MD  Progress Note Reporting Period 10/11/2017 to 12/13/2017  See note below for Objective Data and Assessment of Progress/Goals.      Encounter Date: 12/13/2017  PT End of Session - 12/13/17 1800    Visit Number  30    Number of Visits  42    Date for PT Re-Evaluation  12/21/17    Authorization Type  [employer]    Authorization Time Period       PT Start Time  0850    PT Stop Time  0930    PT Time Calculation (min)  40 min    Equipment Utilized During Treatment  Gait belt    Activity Tolerance  Patient tolerated treatment well;No increased pain    Behavior During Therapy  WFL for tasks assessed/performed       Past Medical History:  Diagnosis Date  . Anemia, unspecified   . Anxiety   . Arthralgia 2010   polyarticular  . Arthritis    "back, knees" (01/10/2017)  . Cancer [facility])    "kidney area" (01/10/2017)  . CHF (congestive heart failure) ([place]) 07/25/2009   denies  . Chronic lower back pain   . Coronary artery disease   . Coughing    pt. reports that he has drainage from sinus infection  . Diabetic foot ulcer ([place])   . Diabetic neuropathy ([place])   . ESRD (end stage renal disease) on dialysis [facility])    started 12/2012; "MWF; [place] "  (01/10/2017)  . GERD (gastroesophageal reflux disease)    hx "before I lost weight", no problem 9 years  . Hemodialysis access site with mature fistula ([place])   . Hemorrhoids, internal 10/2011   small  . High cholesterol   . History of blood transfusion    "related to the anemia"  . Hypertension   . Insomnia, unspecified   . Lacunar infarction ([place]) 2006   RUE/RLE, speech  . Long term (current) use  of anticoagulants   . Myocardial infarction ([place]) 1995  . Orthostatic hypotension   . Osteomyelitis of foot, left, acute ([place])   . Other chronic postoperative pain   . Pneumonia    "probably twice" (01/10/2017)  . Polymyalgia rheumatica ([place])   . Renal insufficiency   . Sleep apnea    "lost weight; no more problem" (01/10/2017)  . Stroke ([place]) 01/10/06   denies residual on 05/09/2014  . Type II diabetes mellitus ([place]) dx'd 1995  . Unspecified hereditary and idiopathic peripheral neuropathy    feet  . Unspecified osteomyelitis, site unspecified   . Unspecified vitamin D deficiency     Past Surgical History:  Procedure Laterality Date  . ABDOMINAL AORTOGRAM N/A 08/25/2016   Procedure: ABDOMINAL AORTOGRAM;  Surgeon: Wellington Hampshire, MD;  Location: [place] [facility];  Service: Cardiovascular;  Laterality: N/A;  . AMPUTATION  01/21/2012   Procedure: AMPUTATION RAY;  Surgeon: [name], MD;  Location: [place];  Service: Orthopedics;  Laterality: Left;  Left Foot 4th Ray Amputation  . AMPUTATION Left 05/04/2013   Procedure: AMPUTATION DIGIT;  Surgeon: [name], MD;  Location: [place];  Service: Orthopedics;  Laterality: Left;  Left Great Toe Amputation at [facility]  . AMPUTATION Left  01/14/2017   Procedure: AMPUTATION ABOVE LEFT KNEE;  Surgeon: [name], MD;  Location: [place];  Service: Orthopedics;  Laterality: Left;  . ANTERIOR CERVICAL DECOMP/DISCECTOMY FUSION  02/2011  . BACK SURGERY    . BASCILIC VEIN TRANSPOSITION Left 10/19/2012   Procedure: BASCILIC VEIN TRANSPOSITION;  Surgeon: Serafina Mitchell, MD;  Location: [place];  Service: Vascular;  Laterality: Left;  . CARDIAC CATHETERIZATION     "before bypass"  . CORONARY ARTERY BYPASS GRAFT     x 5 with lima at [place] WITH ANTIBIOTIC SPACERS Left 08/07/2014   Procedure: Replace Left Total Knee Arthroplasty,  Place Antibiotic Spacer;  Surgeon: [name], MD;  Location: [place];  Service:  Orthopedics;  Laterality: Left;  . I&D EXTREMITY Left 05/09/2014   Procedure: Irrigation and Debridement Left Knee and Closure of Total Knee Arthroplasty Incision;  Surgeon: [name], MD;  Location: [place];  Service: Orthopedics;  Laterality: Left;  . I&D KNEE WITH POLY EXCHANGE Left 05/31/2014   Procedure: IRRIGATION AND DEBRIDEMENT LEFT KNEE, PLACE ANTIBIOTIC BEADS,  POLY EXCHANGE;  Surgeon: [name], MD;  Location: [place];  Service: Orthopedics;  Laterality: Left;  . IRRIGATION AND DEBRIDEMENT KNEE Left 01/12/2017   Procedure: IRRIGATION AND DEBRIDEMENT LEFT KNEE;  Surgeon: [name], MD;  Location: [place];  Service: Orthopedics;  Laterality: Left;  . JOINT REPLACEMENT    . KNEE ARTHROSCOPY Left 08-25-2012  . LOWER EXTREMITY ANGIOGRAPHY Left 08/25/2016   Procedure: Lower Extremity Angiography;  Surgeon: Wellington Hampshire, MD;  Location: [place] [facility];  Service: Cardiovascular;  Laterality: Left;  . PERIPHERAL VASCULAR BALLOON ANGIOPLASTY Left 08/25/2016   Procedure: PERIPHERAL VASCULAR BALLOON ANGIOPLASTY;  Surgeon: Wellington Hampshire, MD;  Location: [place] [facility];  Service: Cardiovascular;  Laterality: Left;  lt peroneal and ant tibial arteries cutting balloon  . REFRACTIVE SURGERY Bilateral   . TOE AMPUTATION Bilateral    "I've lost 7 toes over the last 7 years" (05/09/2014)  . TOE SURGERY Left April 2015   Big toe removed on left foot.  . TONSILLECTOMY    . TOTAL KNEE ARTHROPLASTY Left 04/10/2014   Procedure: TOTAL KNEE ARTHROPLASTY;  Surgeon: [name], MD;  Location: [place];  Service: Orthopedics;  Laterality: Left;  . TOTAL KNEE REVISION Left 10/25/2014   Procedure: LEFT TOTAL KNEE REVISION;  Surgeon: [name], MD;  Location: [place];  Service: Orthopedics;  Laterality: Left;  . TOTAL KNEE REVISION Left 11/26/2015   Procedure: Removal Left Total Knee Arthroplasty, Hinged Total Knee Arthroplasty;  Surgeon: [name], MD;  Location: [place];  Service: Orthopedics;   Laterality: Left;  . UVULOPALATOPHARYNGOPLASTY, TONSILLECTOMY AND SEPTOPLASTY  ~ 1989  . WOUND DEBRIDEMENT Left 05/09/2014   Dehiscence Left Total Knee Arthroplasty Incision    There were no vitals filed for this visit.  Subjective Assessment - 12/13/17 0850    Subjective  His abdominal pain has increased again even with medication. He has lost 8-9 pounds because he is limited in amounts. Colonscopy is still 12/19.     Pertinent History  Left TFA, ESRD with dialysis M,W,F, CHF, CAD s/p CABG 05/2016, PAD, CVA, DM2, neuropathy, arthritis, right toe amputations    Limitations  Lifting;Standing;Walking;House hold activities    Patient Stated Goals  He wants to use prosthesis to walk without assistance, garden, cook / work in [employer]     Currently in Pain?  Yes  Pain Score  5    in last week, worst 9/10 & best 4-5/10   Pain Location  Abdomen    Pain Orientation  Lower    Pain Descriptors / Indicators  Discomfort    Pain Type  Acute pain    Pain Onset  1 to 4 weeks ago    Pain Frequency  Constant    Aggravating Factors   GI issues    Pain Relieving Factors  empty stomach                       [facility] Adult PT Treatment/Exercise - 12/13/17 0845      Transfers   Transfers  Sit to Stand;Stand to Sit    Sit to Stand  5: Supervision;With upper extremity assist;From chair/3-in-1    Stand to Sit  5: Supervision;With upper extremity assist;To chair/3-in-1      Ambulation/Gait   Ambulation/Gait  Yes    Ambulation/Gait Assistance  5: Supervision;4: Min guard    Ambulation/Gait Assistance Details  demo, tactile & verbal cues on pelvic rotation/hip flexion to advance prosthesis & hip extension in stance with wt shift over prosthesis    Ambulation Distance (Feet)  150 Feet   150' X 2 and multiple laps in parallel bars   Assistive device  Straight cane;Prosthesis;None;Parallel bars   intermittent touch on parallel bars working on no device gai   Gait Pattern  Step-through  pattern;Decreased step length - right;Decreased stance time - left;Decreased stride length;Decreased weight shift to left;Trunk flexed;Poor foot clearance - left    Ambulation Surface  Indoor;Level    Gait Comments  worked in parallel bars for gait without assistive device      High Level Balance   High Level Balance Activities  Side stepping;Backward walking    High Level Balance Comments  demo, tactile & verbal cues on technique with TFA prosthesis: near parallel bars initially and progressed near counter with chair backs near to enable work at home.       Neuro Re-ed    Neuro Re-ed Details   prosthetic limb alternate hip flexion forward and hip extension back with PT resisting motion to facilitate switching between motions for prosthetic gait.       Prosthetics   Prosthetic Care Comments   9-10# weight loss can result in suspension issues with limb size change and suction ring not able to maintain contact with socket wall. He may need more socks & prosthetist to add pads to socket.     Current prosthetic wear tolerance (days/week)   daily    Current prosthetic wear tolerance (#hours/day)   reports all awake hours with a break somedays after dialysis for napping    Residual limb condition   no issues per pt report    Education Provided  Other (comment)   see prosthetic care comments   Person(s) Educated  Patient    Education Method  Explanation;Verbal cues    Education Method  Verbalized understanding               PT Short Term Goals - 11/22/17 0855      PT SHORT TERM GOAL #1   Title  Patient ambulates 300' cane & prosthesis with supervision.     Baseline  11/22/17: Pt ambulates 300 ft with cane and prosthesis with supervision/min guard and LOB x1     Time  4    Period  Weeks    Status  Partially Met        PT SHORT TERM GOAL #2   Title  Patient negotiates ramps & curbs with cane & prosthesis (no hand hold) with minA.    Baseline  10/22/17: Pt negotiates ramps and  simulated curb with cane and min A     Time  4    Period  Weeks    Status  Achieved      PT SHORT TERM GOAL #3   Title  Patient descends Kline with 2 rails & prosthesis alternate pattern with supervision.     Baseline  11/22/2017: Pt descends Kline at supervision level with 2 rails/prosthesis.     Time  4    Period  Weeks    Status  Achieved      PT SHORT TERM GOAL #4   Title  Patient able to turn 360* slowly with prosthesis only with supervision.    Baseline  11/22/17: Pt turns 360 slowly at supervision level with prosthesis.     Time  4    Period  Weeks    Status  Achieved      PT SHORT TERM GOAL #5   Title  Patient ambulates 10' with prosthesis only carrying plate with supervision    Baseline  11/22/17: Pt ambulates 20 ft while carrying plate upright with prosthesis only, min guard assistance.     Time  4    Period  Weeks    Status  Not Met        PT Long Term Goals - 10/06/17 1800      PT LONG TERM GOAL #1   Title  Patient verbalizes proper prosthetic care including problem solving issues and when to contact prosthetist. (All updated LTGs Target Date: 12/21/2017)    Time  3    Period  Months    Status  On-going    Target Date  12/21/17      PT LONG TERM GOAL #2   Title  Patient tolerates prosthesis wear >90% of awake hours without skin or limb pain issues to enable function throughout his day.     Time  3    Period  Months    Status  On-going    Target Date  12/21/17      PT LONG TERM GOAL #3   Title  Berg Balance with device >45/56 to indicate lower fall risk.     Time  3    Period  Months    Status  Revised    Target Date  12/21/17      PT LONG TERM GOAL #4   Title  Patient ambulates 1000' outdoors including grass, ramps & curbs with cane or less and prosthesis modified independent.     Time  3    Period  Months    Status  On-going    Target Date  12/21/17      PT LONG TERM GOAL #5   Title  Patient descends Kline with alternate pattern with single  rail & cane and negotiates Kline without rails with cane step-to pattern modified independent.     Time  3    Period  Months    Status  Revised    Target Date  12/21/17      PT LONG TERM GOAL #6   Title  Patient ambulates around furniture with prosthesis only carrying light weight household items modified independent.     Time  3    Period  Months    Status  New    Target Date    12/21/17            Plan - 12/13/17 1800    Clinical Impression Statement  Patient continues to have issues with abdominal pain & is awaiting colonoscopy in ~2 weeks. He has lost ~10# which effects maintaining suction suspension and may need prosthetist to add pads to socket to adjust fit.  Today's skilled PT session focused on prosthetic gait without assistive device working on hip extension & wt shift in stance. Patient appears will need additional PT to achieve maximal function with his microprocessor prosthesis but plan of care may need to be modified depending on outcome of colonoscopy.     Rehab Potential  Good    PT Frequency  2x / week    PT Duration  12 weeks    PT Treatment/Interventions  ADLs/Self Care Home Management;DME Instruction;Gait training;Stair training;Functional mobility training;Therapeutic activities;Therapeutic exercise;Balance training;Neuromuscular re-education;Canalith Repostioning;Patient/family education;Prosthetic Training;Vestibular    PT Next Visit Plan  Begin to check LTGs for recertification, prosthetic gait with cane, balance activities    Consulted and Agree with Plan of Care  Patient       Patient will benefit from skilled therapeutic intervention in order to improve the following deficits and impairments:  Decreased activity tolerance, Abnormal gait, Decreased balance, Decreased endurance, Decreased knowledge of use of DME, Decreased mobility, Decreased strength, Prosthetic Dependency, Postural dysfunction, Dizziness  Visit Diagnosis: Other abnormalities of gait and  mobility  Muscle weakness (generalized)  Unsteadiness on feet  Abnormal posture     Problem List Patient Active Problem List   Diagnosis Date Noted  . Altered mental status   . Cerebral thrombosis with cerebral infarction 02/06/2017  . Cerebral embolism with cerebral infarction 02/06/2017  . Above knee amputation status, left   . Pressure injury of skin 01/30/2017  . Acute lower UTI 01/29/2017  . Acute metabolic encephalopathy 67/20/9470  . UTI (urinary tract infection) 01/29/2017  . Quadriceps muscle rupture, left, initial encounter   . Fall 01/09/2017  . Gait disturbance 01/09/2017  . Staphylococcus aureus infection 01/09/2017  . Infection of prosthetic left knee joint ([place]) 01/09/2017  . Fever, unknown origin 11/09/2016  . Critical lower limb ischemia 08/25/2016  . Ulcer of left midfoot with fat layer exposed ([place]) 08/13/2016  . Diabetic ulcer of left midfoot associated with type 2 diabetes mellitus, with fat layer exposed ([place]) 07/25/2016  . Peripheral neuropathy 07/22/2016  . Tobacco abuse 07/22/2016  . CAD in native artery 05/18/2016  . CAD, multiple vessel 05/11/2016  . Positive cardiac stress test 05/11/2016  . Abnormal stress test 04/30/2016  . Pre-transplant evaluation for kidney transplant 04/30/2016  . S/P revision of total knee 11/26/2015  . Pain in the chest   . Acute on chronic diastolic heart failure ([place]) 07/05/2015  . Volume overload 07/04/2015  . Shortness of breath 07/04/2015  . Hypoxemia 07/04/2015  . Elevated troponin   . End-stage renal disease on hemodialysis ([place])   . Hypervolemia   . 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 Pyogenic bacterial arthritis of knee, left ([place]) 08/07/2014  . Tachycardia 07/24/2014  . Acute upper respiratory infection 07/24/2014  . ESRD on dialysis ([place]) 07/14/2014  . Type II diabetes mellitus ([place]) 07/14/2014  . Anemia in chronic kidney disease 07/14/2014  . Congestive heart failure (CHF) ([place])  07/13/2014  . Surgical wound dehiscence 05/09/2014  . Dehiscence of closure of skin 05/09/2014  . Total knee replacement status 04/10/2014  . Diabetes mellitus with renal manifestations, controlled ([place]) 10/24/2013  .  Hypertensive renal disease 06/27/2013  . DM type 2 causing vascular disease ([place]) 06/27/2013  . Erectile dysfunction 06/27/2013  . Depression 06/27/2013  . Claudication of left lower extremity ([place]) 12/19/2012  . Essential hypertension, benign 12/19/2012  . Sinusitis, acute maxillary 11/22/2012  . Otitis, externa, infective 11/14/2012  . Leg edema, left 11/14/2012  . End stage renal disease ([place]) 10/02/2012  . Controlled type 2 DM with proteinuria or microalbuminuria 09/19/2012  . GERD (gastroesophageal reflux disease) 09/19/2012  . Leukocytosis 09/19/2012  . Lacunar infarction ([place]) 08/17/2012  . Polymyalgia rheumatica ([place]) 08/17/2012  . 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 Essential hypertension 05/10/2012  . Vitamin D deficiency 05/10/2012  . Diabetes mellitus due to underlying condition ([place]) 05/10/2012  . Hyperlipidemia LDL goal <100 05/10/2012  . Anemia of chronic disease 05/10/2012  . Screening for prostate cancer 05/10/2012  . Chronic kidney disease (CKD), stage IV (severe) ([place]) 05/10/2012  . Peripheral autonomic neuropathy due to DM ([place]) 05/10/2012  . Callus of foot 05/10/2012  . Urgency of urination 05/10/2012  . Hyperkalemia 05/10/2012  . Candidiasis of the esophagus 10/12/2011  . Internal hemorrhoids without mention of complication 80/16/5537  . Pre-syncope 07/25/2009  . DJD (degenerative joint disease) of cervical spine 02/17/2009    Rafferty Postlewait PT, DPT 12/14/2017, 7:01 AM  [facility] [address] [place], [place], [address] Phone: [phone]   Fax:  [phone]  Name: Matthew Kline MRN: [id number] Date of Birth: [birth year]

## 2017-12-15 ENCOUNTER — Ambulatory Visit: Payer: Medicare HMO | Admitting: Physical Therapy

## 2017-12-16 ENCOUNTER — Emergency Department (HOSPITAL_COMMUNITY)
Admission: EM | Admit: 2017-12-16 | Discharge: 2017-12-17 | Disposition: A | Discharge status: Home / Self Care | Payer: Medicare HMO | Attending: Emergency Medicine | Admitting: Emergency Medicine

## 2017-12-16 ENCOUNTER — Emergency Department (HOSPITAL_COMMUNITY): Payer: Medicare HMO

## 2017-12-16 ENCOUNTER — Encounter (HOSPITAL_COMMUNITY): Payer: Self-pay | Admitting: Emergency Medicine

## 2017-12-16 DIAGNOSIS — I5032 Chronic diastolic (congestive) heart failure: Secondary | ICD-10-CM | POA: Insufficient documentation

## 2017-12-16 DIAGNOSIS — R1011 Right upper quadrant pain: Secondary | ICD-10-CM | POA: Diagnosis present

## 2017-12-16 DIAGNOSIS — K8020 Calculus of gallbladder without cholecystitis without obstruction: Secondary | ICD-10-CM

## 2017-12-16 DIAGNOSIS — Z79899 Other long term (current) drug therapy: Secondary | ICD-10-CM | POA: Insufficient documentation

## 2017-12-16 DIAGNOSIS — Z89612 Acquired absence of left leg above knee: Secondary | ICD-10-CM | POA: Diagnosis not present

## 2017-12-16 DIAGNOSIS — N186 End stage renal disease: Secondary | ICD-10-CM | POA: Diagnosis not present

## 2017-12-16 DIAGNOSIS — I132 Hypertensive heart and chronic kidney disease with heart failure and with stage 5 chronic kidney disease, or end stage renal disease: Secondary | ICD-10-CM | POA: Insufficient documentation

## 2017-12-16 DIAGNOSIS — I2510 Atherosclerotic heart disease of native coronary artery without angina pectoris: Secondary | ICD-10-CM | POA: Insufficient documentation

## 2017-12-16 DIAGNOSIS — Z87891 Personal history of nicotine dependence: Secondary | ICD-10-CM | POA: Diagnosis not present

## 2017-12-16 DIAGNOSIS — Z96652 Presence of left artificial knee joint: Secondary | ICD-10-CM | POA: Diagnosis not present

## 2017-12-16 DIAGNOSIS — Z951 Presence of aortocoronary bypass graft: Secondary | ICD-10-CM | POA: Diagnosis not present

## 2017-12-16 DIAGNOSIS — Z992 Dependence on renal dialysis: Secondary | ICD-10-CM | POA: Insufficient documentation

## 2017-12-16 LAB — COMPREHENSIVE METABOLIC PANEL
ALBUMIN: 3.3 g/dL — AB (ref 3.5–5.0)
ALT: 7 U/L (ref 0–44)
AST: 9 U/L — AB (ref 15–41)
Alkaline Phosphatase: 51 U/L (ref 38–126)
Anion gap: 19 — ABNORMAL HIGH (ref 5–15)
BUN: 60 mg/dL — AB (ref 6–20)
CO2: 24 mmol/L (ref 22–32)
CREATININE: 11.94 mg/dL — AB (ref 0.61–1.24)
Calcium: 9 mg/dL (ref 8.9–10.3)
Chloride: 94 mmol/L — ABNORMAL LOW (ref 98–111)
GFR calc Af Amer: 5 mL/min — ABNORMAL LOW (ref >60)
GFR calc non Af Amer: 4 mL/min — ABNORMAL LOW (ref >60)
GLUCOSE: 101 mg/dL — AB (ref 70–99)
POTASSIUM: 6 mmol/L — AB (ref 3.5–5.1)
Sodium: 137 mmol/L (ref 135–145)
Total Bilirubin: 0.7 mg/dL (ref 0.3–1.2)
Total Protein: 7.3 g/dL (ref 6.5–8.1)

## 2017-12-16 LAB — CBC WITH DIFFERENTIAL/PLATELET
ABS IMMATURE GRANULOCYTES: 0.02 10*3/uL (ref 0.00–0.07)
BASOS PCT: 1 %
Basophils Absolute: 0 10*3/uL (ref 0.0–0.1)
EOS ABS: 0.1 10*3/uL (ref 0.0–0.5)
Eosinophils Relative: 1 %
HEMATOCRIT: 31.8 % — AB (ref 39.0–52.0)
Hemoglobin: 9.7 g/dL — ABNORMAL LOW (ref 13.0–17.0)
IMMATURE GRANULOCYTES: 0 %
LYMPHS ABS: 1.3 10*3/uL (ref 0.7–4.0)
Lymphocytes Relative: 19 %
MCH: 31 pg (ref 26.0–34.0)
MCHC: 30.5 g/dL (ref 30.0–36.0)
MCV: 101.6 fL — ABNORMAL HIGH (ref 80.0–100.0)
MONOS PCT: 13 %
Monocytes Absolute: 0.9 10*3/uL (ref 0.1–1.0)
NEUTROS ABS: 4.4 10*3/uL (ref 1.7–7.7)
NEUTROS PCT: 66 %
NRBC: 0 % (ref 0.0–0.2)
PLATELETS: 151 10*3/uL (ref 150–400)
RBC: 3.13 MIL/uL — ABNORMAL LOW (ref 4.22–5.81)
RDW: 13.2 % (ref 11.5–15.5)
WBC: 6.7 10*3/uL (ref 4.0–10.5)

## 2017-12-16 LAB — LIPASE, BLOOD: Lipase: 34 U/L (ref 11–51)

## 2017-12-16 LAB — POC OCCULT BLOOD, ED: FECAL OCCULT BLD: NEGATIVE

## 2017-12-16 MED ORDER — ONDANSETRON HCL 4 MG/2ML IJ SOLN
4.0000 mg | Freq: Once | INTRAMUSCULAR | Status: AC
  Administered 2017-12-16: 4 mg via INTRAVENOUS
  Filled 2017-12-16: qty 2

## 2017-12-16 MED ORDER — ONDANSETRON 4 MG PO TBDP: 4.0000 mg | ORAL_TABLET | Freq: Three times a day (TID) | ORAL | 0 refills | Status: DC | PRN

## 2017-12-16 MED ORDER — MORPHINE SULFATE (PF) 4 MG/ML IV SOLN
4.0000 mg | Freq: Once | INTRAVENOUS | Status: AC
  Administered 2017-12-16: 4 mg via INTRAVENOUS
  Filled 2017-12-16: qty 1

## 2017-12-16 MED ORDER — TRAMADOL HCL 50 MG PO TABS: 50.0000 mg | ORAL_TABLET | Freq: Four times a day (QID) | ORAL | 0 refills | Status: DC | PRN

## 2017-12-16 MED ORDER — SODIUM POLYSTYRENE SULFONATE 15 GM/60ML PO SUSP
60.0000 g | Freq: Once | ORAL | Status: AC
  Administered 2017-12-16: 60 g via ORAL
  Filled 2017-12-16: qty 240

## 2017-12-16 NOTE — ED Notes (Signed)
Patient transported to Ultrasound 

## 2017-12-16 NOTE — ED Triage Notes (Signed)
Pt arrives via EMS from home with reports of feeling "sick" since Wednesday with flu like symptoms. Reports he has been dealing with nausea and diarrhea for "a long time." Pt reports missing HD Wed and today due to being sick.

## 2017-12-16 NOTE — ED Notes (Signed)
Patient transported to X-ray 

## 2017-12-16 NOTE — Discharge Instructions (Addendum)
You were seen in the ER for flulike symptoms and upper abdominal pain.  Ultrasound today showed you have gallstones.  This could be causing intermittent upper abdominal pain that you describe.  Typically gallbladder pain is worse with meals and can cause nausea and vomiting.  You need close follow-up with general surgery for further discussion of this.  Take 500 to 1000 mg of acetaminophen every 6-8 hours for mild to moderate pain, tramadol 50 mg for breakthrough or severe pain.  Zofran for nausea.  Return to the ER for fever, constant or worsening right upper abdominal pain, green vomit, fevers, chills.  Your creatinine and potassium are elevated today.  This is from missing dialysis last 2 days.  Go to dialysis as scheduled tomorrow.

## 2017-12-16 NOTE — ED Provider Notes (Signed)
[facility] EMERGENCY DEPARTMENT Provider Note   CSN: [id number] Arrival date & time: 12/16/17  1759     History   Chief Complaint Chief Complaint  Patient presents with  . Nausea    HPI Matthew Kline is a 57 y.o. male with past medical history of left AKA, chronic anemia, ESRD on dialysis MWF, diabetes is here for evaluation of "everything".  Onset 1 week ago.  Describes feeling like he has the flu.  His symptoms include subjective fevers, chills, intermittent headaches, cough with phlegm production, nasal congestion, runny nose, abdominal pain, nausea, vomiting, changes to BMs.  Admits that nausea, vomiting, abdominal pain, diarrhea have been presents for months.  His abdominal pain is in epigastric/right upper quadrant.  This abd pain has been getting worse in the last week.  His pain is constant, stabbing, nonradiating 8/10, currently 6/10. Abdominal pain associated with decreased appetite, nausea, nbnb vomiting.  Abdominal pain is worse with and after meals.  Reports approximately 3 months ago he had a CT and they told him he had colitis, he finished Cipro and Flagyl antibiotics.  Since, his diarrhea has improved and now it is less runny, non melenotic, non bloody.  His abdominal pain has not improved.  He has chosen to eat less because eating makes his pain worse.   He was seen in the ER 11/20 for abdominal pain, CT A/P at that time showed cholelithiasis.  He has minimal urine output but denies recent dysuria, hematuria.  He does not think his symptoms are from dialysis or the lack of.  He last went on Monday and skipped Wednesday because he felt too sick.  No abdominal surgeries.  No known sick contacts.  No interventions for this.  No alleviating factors.  He denies frank measured fevers, chest pain, shortness of breath.    HPI  Past Medical History:  Diagnosis Date  . Anemia, unspecified   . Anxiety   . Arthralgia 2010   polyarticular  . Arthritis    "back, knees" (01/10/2017)  . [facility])    "kidney area" (01/10/2017)  . CHF (congestive heart failure) ([place]) 07/25/2009   denies  . Chronic lower back pain   . Coronary artery disease   . Coughing    pt. reports that he has drainage from sinus infection  . Diabetic foot ulcer ([place])   . Diabetic neuropathy ([place])   . ESRD (end stage renal disease) on dialysis [facility])    started 12/2012; "MWF; [facility] "  (01/10/2017)  . GERD (gastroesophageal reflux disease)    hx "before I lost weight", no problem 9 years  . Hemodialysis access site with mature fistula ([place])   . Hemorrhoids, internal 10/2011   small  . High cholesterol   . History of blood transfusion    "related to the anemia"  . Hypertension   . Insomnia, unspecified   . Lacunar infarction ([place]) 2006   RUE/RLE, speech  . Long term (current) use of anticoagulants   . Myocardial infarction ([place]) 1995  . Orthostatic hypotension   . Osteomyelitis of foot, left, acute ([place])   . Other chronic postoperative pain   . Pneumonia    "probably twice" (01/10/2017)  . Polymyalgia rheumatica ([place])   . Renal insufficiency   . Sleep apnea    "lost weight; no more problem" (01/10/2017)  . Stroke ([place]) 01/10/06   denies residual on 05/09/2014  . Type II diabetes mellitus ([place]) dx'd 1995  . Unspecified hereditary and  idiopathic peripheral neuropathy    feet  . Unspecified osteomyelitis, site unspecified   . Unspecified vitamin D deficiency     Patient Active Problem List   Diagnosis Date Noted  . Altered mental status   . Cerebral thrombosis with cerebral infarction 02/06/2017  . Cerebral embolism with cerebral infarction 02/06/2017  . Above knee amputation status, left   . Pressure injury of skin 01/30/2017  . Acute lower UTI 01/29/2017  . Acute metabolic encephalopathy 18/56/3149  . UTI (urinary tract infection) 01/29/2017  . Quadriceps muscle rupture, left, initial encounter   . Fall 01/09/2017  . Gait disturbance  01/09/2017  . Staphylococcus aureus infection 01/09/2017  . Infection of prosthetic left knee joint ([place]) 01/09/2017  . Fever, unknown origin 11/09/2016  . Critical lower limb ischemia 08/25/2016  . Ulcer of left midfoot with fat layer exposed ([place]) 08/13/2016  . Diabetic ulcer of left midfoot associated with type 2 diabetes mellitus, with fat layer exposed ([place]) 07/25/2016  . Peripheral neuropathy 07/22/2016  . Tobacco abuse 07/22/2016  . CAD in native artery 05/18/2016  . CAD, multiple vessel 05/11/2016  . Positive cardiac stress test 05/11/2016  . Abnormal stress test 04/30/2016  . Pre-transplant evaluation for kidney transplant 04/30/2016  . S/P revision of total knee 11/26/2015  . Pain in the chest   . Acute on chronic diastolic heart failure ([place]) 07/05/2015  . Volume overload 07/04/2015  . Shortness of breath 07/04/2015  . Hypoxemia 07/04/2015  . Elevated troponin   . End-stage renal disease on hemodialysis ([place])   . Hypervolemia   . 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 Pyogenic bacterial arthritis of knee, left ([place]) 08/07/2014  . Tachycardia 07/24/2014  . Acute upper respiratory infection 07/24/2014  . ESRD on dialysis ([place]) 07/14/2014  . Type II diabetes mellitus ([place]) 07/14/2014  . Anemia in chronic kidney disease 07/14/2014  . Congestive heart failure (CHF) ([place]) 07/13/2014  . Surgical wound dehiscence 05/09/2014  . Dehiscence of closure of skin 05/09/2014  . Total knee replacement status 04/10/2014  . Diabetes mellitus with renal manifestations, controlled ([place]) 10/24/2013  . Hypertensive renal disease 06/27/2013  . DM type 2 causing vascular disease ([place]) 06/27/2013  . Erectile dysfunction 06/27/2013  . Depression 06/27/2013  . Claudication of left lower extremity ([place]) 12/19/2012  . Essential hypertension, benign 12/19/2012  . Sinusitis, acute maxillary 11/22/2012  . Otitis, externa, infective 11/14/2012  . Leg edema, left 11/14/2012  . End  stage renal disease ([place]) 10/02/2012  . Controlled type 2 DM with proteinuria or microalbuminuria 09/19/2012  . GERD (gastroesophageal reflux disease) 09/19/2012  . Leukocytosis 09/19/2012  . Lacunar infarction ([place]) 08/17/2012  . Polymyalgia rheumatica ([place]) 08/17/2012  . 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 Essential hypertension 05/10/2012  . Vitamin D deficiency 05/10/2012  . Diabetes mellitus due to underlying condition ([place]) 05/10/2012  . Hyperlipidemia LDL goal <100 05/10/2012  . Anemia of chronic disease 05/10/2012  . Screening for prostate cancer 05/10/2012  . Chronic kidney disease (CKD), stage IV (severe) ([place]) 05/10/2012  . Peripheral autonomic neuropathy due to DM ([place]) 05/10/2012  . Callus of foot 05/10/2012  . Urgency of urination 05/10/2012  . Hyperkalemia 05/10/2012  . Candidiasis of the esophagus 10/12/2011  . Internal hemorrhoids without mention of complication 70/26/3785  . Pre-syncope 07/25/2009  . DJD (degenerative joint disease) of cervical spine 02/17/2009    Past Surgical History:  Procedure Laterality Date  . ABDOMINAL AORTOGRAM N/A 08/25/2016   Procedure: ABDOMINAL AORTOGRAM;  Surgeon:  Wellington Hampshire, MD;  Location: [facility];  Service: Cardiovascular;  Laterality: N/A;  . AMPUTATION  01/21/2012   Procedure: AMPUTATION RAY;  Surgeon: [name], MD;  Location: [facility];  Service: Orthopedics;  Laterality: Left;  Left Foot 4th Ray Amputation  . AMPUTATION Left 05/04/2013   Procedure: AMPUTATION DIGIT;  Surgeon: [name], MD;  Location: [facility];  Service: Orthopedics;  Laterality: Left;  Left Great Toe Amputation at [facility]  . AMPUTATION Left 01/14/2017   Procedure: AMPUTATION ABOVE LEFT KNEE;  Surgeon: [name], MD;  Location: [facility];  Service: Orthopedics;  Laterality: Left;  . ANTERIOR CERVICAL DECOMP/DISCECTOMY FUSION  02/2011  . BACK SURGERY    . BASCILIC VEIN TRANSPOSITION Left 10/19/2012   Procedure: BASCILIC VEIN TRANSPOSITION;  Surgeon:  Serafina Mitchell, MD;  Location: [facility];  Service: Vascular;  Laterality: Left;  . CARDIAC CATHETERIZATION     "before bypass"  . CORONARY ARTERY BYPASS GRAFT     x 5 with lima at [place] WITH ANTIBIOTIC SPACERS Left 08/07/2014   Procedure: Replace Left Total Knee Arthroplasty,  Place Antibiotic Spacer;  Surgeon: [name], MD;  Location: [facility];  Service: Orthopedics;  Laterality: Left;  . I&D EXTREMITY Left 05/09/2014   Procedure: Irrigation and Debridement Left Knee and Closure of Total Knee Arthroplasty Incision;  Surgeon: [name], MD;  Location: [facility];  Service: Orthopedics;  Laterality: Left;  . I&D KNEE WITH POLY EXCHANGE Left 05/31/2014   Procedure: IRRIGATION AND DEBRIDEMENT LEFT KNEE, PLACE ANTIBIOTIC BEADS,  POLY EXCHANGE;  Surgeon: [name], MD;  Location: [facility];  Service: Orthopedics;  Laterality: Left;  . IRRIGATION AND DEBRIDEMENT KNEE Left 01/12/2017   Procedure: IRRIGATION AND DEBRIDEMENT LEFT KNEE;  Surgeon: [name], MD;  Location: [facility];  Service: Orthopedics;  Laterality: Left;  . JOINT REPLACEMENT    . KNEE ARTHROSCOPY Left 08-25-2012  . LOWER EXTREMITY ANGIOGRAPHY Left 08/25/2016   Procedure: Lower Extremity Angiography;  Surgeon: Wellington Hampshire, MD;  Location: [facility];  Service: Cardiovascular;  Laterality: Left;  . PERIPHERAL VASCULAR BALLOON ANGIOPLASTY Left 08/25/2016   Procedure: PERIPHERAL VASCULAR BALLOON ANGIOPLASTY;  Surgeon: Wellington Hampshire, MD;  Location: [facility];  Service: Cardiovascular;  Laterality: Left;  lt peroneal and ant tibial arteries cutting balloon  . REFRACTIVE SURGERY Bilateral   . TOE AMPUTATION Bilateral    "I've lost 7 toes over the last 7 years" (05/09/2014)  . TOE SURGERY Left April 2015   Big toe removed on left foot.  . TONSILLECTOMY    . TOTAL KNEE ARTHROPLASTY Left 04/10/2014   Procedure: TOTAL KNEE ARTHROPLASTY;  Surgeon: [name], MD;  Location: [facility];  Service: Orthopedics;  Laterality: Left;  . TOTAL KNEE REVISION Left 10/25/2014   Procedure: LEFT TOTAL KNEE REVISION;  Surgeon: [name], MD;  Location: [facility];  Service: Orthopedics;  Laterality: Left;  . TOTAL KNEE REVISION Left 11/26/2015   Procedure: Removal Left Total Knee Arthroplasty, Hinged Total Knee Arthroplasty;  Surgeon: [name], MD;  Location: [facility];  Service: Orthopedics;  Laterality: Left;  . UVULOPALATOPHARYNGOPLASTY, TONSILLECTOMY AND SEPTOPLASTY  ~ 1989  . WOUND DEBRIDEMENT Left 05/09/2014   Dehiscence Left Total Knee Arthroplasty Incision        Home Medications    Prior to Admission medications   Medication Sig Start Date End Date Taking? Authorizing  Provider  amLODipine (NORVASC) 5 MG tablet Take 5 mg by mouth daily.    Yes [provider]  carvedilol (COREG) 12.5 MG tablet Take 1 tablet (12.5 mg total) by mouth 2 (two) times daily with a meal. Patient taking differently: Take 6.25 mg by mouth 2 (two) times daily with a meal.  02/08/17  Yes Bonnell Public, MD  Darbepoetin Alfa (ARANESP) 150 MCG/0.3ML SOSY injection Inject 0.3 mLs (150 mcg total) into the vein every Friday with hemodialysis. 02/11/17  Yes Dana Allan I, MD  gabapentin (NEURONTIN) 300 MG capsule TAKE 1 CAPSULE BY MOUTH THREE TIMES A DAY Patient taking differently: Take 300 mg by mouth 3 (three) times daily.  10/24/17  Yes [name], MD  montelukast (SINGULAIR) 10 MG tablet TAKE 1 TABLET (10 MG TOTAL) BY MOUTH AT BEDTIME. Patient taking differently: Take 10 mg by mouth at bedtime.  09/01/15  Yes Gildardo Cranker, DO  multivitamin (RENA-VIT) TABS tablet Take 1 tablet by mouth at bedtime. 02/08/17  Yes Dana Allan I, MD  sevelamer carbonate (RENVELA) 800 MG tablet Take 800-2,400 mg by mouth 3 (three) times daily with meals.   Yes [provider]  temazepam (RESTORIL) 15 MG capsule Take 30 mg by mouth at bedtime as needed for sleep.  02/28/17  Yes [provider]  Amino Acids-Protein Hydrolys (FEEDING SUPPLEMENT, PRO-STAT SUGAR FREE 64,) LIQD Take 30 mLs by mouth 2 (two) times daily. Patient not taking: Reported on 11/30/2017 01/19/17   Patrecia Pour, Christean Grief, MD  aspirin EC 81 MG EC tablet Take 2 tablets (162 mg total) by mouth daily. Patient not taking: Reported on 11/30/2017 01/20/17   Patrecia Pour, Christean Grief, MD  ciprofloxacin (CIPRO) 250 MG tablet Take 250 mg by mouth daily.    [provider]  ferric citrate (AURYXIA) 1 GM 210 MG(Fe) tablet Take 2 tablets (420 mg total) by mouth 3 (three) times daily with meals. Patient not taking: Reported on 11/30/2017 02/08/17   Dana Allan I, MD  metroNIDAZOLE (FLAGYL) 500 MG tablet Take 500 mg by mouth 3 (three) times daily.    [provider]  Nutritional Supplements (FEEDING SUPPLEMENT, NEPRO CARB STEADY,) LIQD Take 237 mLs by mouth 2 (two) times daily between meals. Patient not taking: Reported on 11/30/2017 01/19/17   Patrecia Pour, Christean Grief, MD  ondansetron (ZOFRAN ODT) 4 MG disintegrating tablet Take 1 tablet (4 mg total) by mouth every 8 (eight) hours as needed for nausea or vomiting. 12/16/17   Kinnie Feil, PA-C  pentoxifylline (TRENTAL) 400 MG CR tablet TAKE 1 TABLET (400 MG TOTAL) BY MOUTH 3 (THREE) TIMES DAILY WITH MEALS. Patient not taking: Reported on 12/16/2017 11/24/17   [name], MD  traMADol (ULTRAM) 50 MG tablet Take 1 tablet (50 mg total) by mouth every 6 (six) hours as needed. 12/16/17   Kinnie Feil, PA-C    Family History Family History  Problem Relation Age of Onset  . Hypertension Mother   . Cancer Mother 57       Ovarian  . Heart disease Maternal Aunt   . Stroke Maternal Grandfather     Social History Social History   Tobacco Use  . Smoking status: Former Smoker    Packs/day: 0.12    Years: 32.00    Pack years: 3.84    Types: Cigarettes    Last attempt to quit: 10/05/2016    Years since quitting: 1.2  . Smokeless tobacco: Never Used   Substance Use Topics  .  Alcohol use: No    Alcohol/week: 0.0 standard drinks  . Drug use: No     Allergies   Morphine and related and Tygacil [tigecycline]   Review of Systems Review of Systems  Constitutional: Positive for appetite change and fever (subjective).  HENT: Positive for congestion and postnasal drip.   Respiratory: Positive for cough.   Gastrointestinal: Positive for abdominal pain, diarrhea, nausea and vomiting.  Neurological: Positive for headaches.  All other systems reviewed and are negative.    Physical Exam Updated Vital Signs BP 139/81 (BP Location: Right Arm)   Pulse 95   Temp 97.8 F (36.6 C) (Oral)   Resp 18   Ht 5' 10.5" (1.791 m)   Wt 76 kg   SpO2 95%   BMI 23.70 kg/m   Physical Exam  Constitutional: He is oriented to person, place, and time. He appears well-developed and well-nourished. No distress.  NAD.  HENT:  Head: Normocephalic and atraumatic.  Right Ear: External ear normal.  Left Ear: External ear normal.  Nose: Nose normal.  Eyes: Conjunctivae and EOM are normal. No scleral icterus.  Neck: Normal range of motion. Neck supple.  Cardiovascular: Normal rate, regular rhythm, normal heart sounds and intact distal pulses.  Trace edema to ankles bilaterally, symmetric.  No calf tenderness.  Pulmonary/Chest: Effort normal and breath sounds normal.  Lungs clear throughout.  No shortness of breath with head of the bed flat.  No crackles.  Abdominal: Soft. There is tenderness.  TTP to right upper quadrant.  Positive Murphy's.  No guarding, rigidity.  Negative McBurney's.  No suprapubic or CVA tenderness.  Active bowel sounds to lower quadrants.  Musculoskeletal: Normal range of motion. He exhibits no deformity.  Neurological: He is alert and oriented to person, place, and time.  Skin: Skin is warm and dry. Capillary refill takes less than 2 seconds.  Psychiatric: He has a normal mood and affect. His behavior is normal. Judgment and  thought content normal.  Nursing note and vitals reviewed.    ED Treatments / Results  Labs (all labs ordered are listed, but only abnormal results are displayed) Labs Reviewed  CBC WITH DIFFERENTIAL/PLATELET - Abnormal; Notable for the following components:      Result Value   RBC 3.13 (*)    Hemoglobin 9.7 (*)    HCT 31.8 (*)    MCV 101.6 (*)    All other components within normal limits  COMPREHENSIVE METABOLIC PANEL - Abnormal; Notable for the following components:   Potassium 6.0 (*)    Chloride 94 (*)    Glucose, Bld 101 (*)    BUN 60 (*)    Creatinine, Ser 11.94 (*)    Albumin 3.3 (*)    AST 9 (*)    GFR calc non Af Amer 4 (*)    GFR calc Af Amer 5 (*)    Anion gap 19 (*)    All other components within normal limits  LIPASE, BLOOD  POC OCCULT BLOOD, ED    EKG EKG Interpretation  Date/Time:  Friday December 16 2017 18:10:35 EST Ventricular Rate:  88 PR Interval:    QRS Duration: 89 QT Interval:  387 QTC Calculation: 469 R Axis:   60 Text Interpretation:  Sinus rhythm Probable left atrial enlargement Abnormal T, consider ischemia, lateral leads Confirmed by Virgel Manifold [phone]) on 12/16/2017 6:14:59 PM   Radiology Dg Chest 2 View  Result Date: 12/16/2017 CLINICAL DATA:  Malaise since Wednesday with flu like symptoms. EXAM: CHEST -  2 VIEW COMPARISON:  04/04/2017 FINDINGS: ACDF of the included cervical spine. The patient is status post CABG with stable borderline cardiomegaly. Mild aortic atherosclerosis is noted at the arch without aneurysm. Prominent perihilar interstitial lung markings persist which may reflect chronic bronchitic change. Scarring and/or atelectasis is seen at the left lung base mid lung. No pulmonary consolidation, effusion or pneumothorax. No acute osseous abnormality. IMPRESSION: 1. Borderline cardiomegaly with aortic atherosclerosis. 2. Chronic bronchitic change suspected of the lungs with increased interstitial markings about the hila. 3.  Status post CABG. Electronically Signed   By: Ashley Royalty M.D.   On: 12/16/2017 20:06   US Abdomen Limited Ruq  Result Date: 12/16/2017 CLINICAL DATA:  RIGHT upper quadrant pain.  History of gallstones. EXAM: ULTRASOUND ABDOMEN LIMITED RIGHT UPPER QUADRANT COMPARISON:  CT abdomen and pelvis November 30, 2017 FINDINGS: Gallbladder: Multiple echogenic gallstones measuring to 11 mm with acoustic shadowing. Additional layering gallbladder sludge. No gallbladder wall thickening or pericholecystic fluid. No sonographic Murphy sign elicited. Common bile duct: Diameter: 5 mm. Liver: No focal lesion identified. Within normal limits in parenchymal echogenicity. Portal vein is patent on color Doppler imaging with normal direction of blood flow towards the liver. IMPRESSION: 1. Cholelithiasis and sludge without sonographic findings of acute cholecystitis. Electronically Signed   By: Elon Alas M.D.   On: 12/16/2017 22:25    Procedures Procedures (including critical care time)  Medications Ordered in ED Medications  ondansetron (ZOFRAN) injection 4 mg (4 mg Intravenous Given 12/16/17 1935)  morphine 4 MG/ML injection 4 mg (4 mg Intravenous Given 12/16/17 1938)  sodium polystyrene (KAYEXALATE) 15 GM/60ML suspension 60 g (60 g Oral Given 12/16/17 2351)     Initial Impression / Assessment and Plan / ED Course  I have reviewed the triage vital signs and the nursing notes.  Pertinent labs & imaging results that were available during my care of the patient were reviewed by me and considered in my medical decision making (see chart for details).  Clinical Course as of Dec 17 32  Fri Dec 16, 2017  2253 IMPRESSION: 1. Cholelithiasis and sludge without sonographic findings of acute cholecystitis.  US Abdomen Limited RUQ [CG]  2253 Potassium(!): 6.0 [CG]  2253 Creatinine(!): 11.94 [CG]  2254 Hemoglobin(!): 9.7 [CG]    Clinical Course User Index [CG] Kinnie Feil, PA-C   57 year old here with  flulike symptoms x 1 week, and acute on chronic right upper quadrant abdominal pain.  Upper respiratory symptoms likely from viral etiology.  He is afebrile without tachycardia, tachypnea, hypoxia and his lung exam is normal.  I have low suspicion for pneumonia however given medical complexity, will obtain chest x-ray to rule this out.  In regards to his abdominal pain, this is chronic however he has right upper quadrant tenderness with known cholelithiasis.  Differential here includes biliary colic versus cholecystitis versus pancreatitis versus GERD.  He produces minimal urine output and denies any urinary tract infection symptoms, renal stones.  0030: K6.0 without significant EKG changes.  Creatinine 11.94.  This reflects recent missed dialysis.  He does not look significantly fluid overloaded, is in no respiratory distress.  His lung exam is normal.  We will give Kayexalate.  He has scheduled dialysis for tomorrow at 1 PM.  I think this is appropriate.  I see no indication for emergent dialysis today.  He seems like a reliable patient to follow-up tomorrow as scheduled.  Hemoglobin 9.7 decreased from previous.  Hemoccult is negative.  Chest x-ray without pneumonia  and mild increased interstitial markings versus bronchitis.  Ultrasound shows cholelithiasis with sludge but no cholecystitis.  His LFTs and total bilirubin are WNL.  Right upper quadrant abdominal pain improved.  He is tolerating p.o.  Discussed work-up with patient.  He is to follow-up with general surgery for further discussion of biliary colic.  In regards to his recent changes in BMs, intermittent diarrhea, he has a colonoscopy scheduled for December 19 with GI.  Will discharge with Zofran, tramadol, Tylenol, general surgery follow-up and colonoscopy with GI.  Return precautions given.  Patient discussed with Dr. Wilson Singer.  Final Clinical Impressions(s) / ED Diagnoses   Final diagnoses:  RUQ abdominal pain  Calculus of gallbladder without  cholecystitis without obstruction    ED Discharge Orders         Ordered    traMADol (ULTRAM) 50 MG tablet  Every 6 hours PRN     12/16/17 2333    ondansetron (ZOFRAN ODT) 4 MG disintegrating tablet  Every 8 hours PRN     12/16/17 2333           Kinnie Feil, PA-C 12/17/17 0034    Virgel Manifold, MD 12/17/17 [phone]

## 2017-12-17 NOTE — ED Notes (Signed)
[facility] called  

## 2017-12-20 ENCOUNTER — Encounter: Payer: Self-pay | Admitting: Physical Therapy

## 2017-12-20 ENCOUNTER — Ambulatory Visit: Payer: Medicare HMO | Admitting: Physical Therapy

## 2017-12-20 DIAGNOSIS — R293 Abnormal posture: Secondary | ICD-10-CM

## 2017-12-20 DIAGNOSIS — R2689 Other abnormalities of gait and mobility: Secondary | ICD-10-CM | POA: Diagnosis not present

## 2017-12-20 DIAGNOSIS — M6281 Muscle weakness (generalized): Secondary | ICD-10-CM

## 2017-12-20 DIAGNOSIS — R2681 Unsteadiness on feet: Secondary | ICD-10-CM

## 2017-12-20 DIAGNOSIS — Z9181 History of falling: Secondary | ICD-10-CM

## 2017-12-21 NOTE — Therapy (Signed)
[facility] [address] [place], [place], [address] Phone: [phone]   Fax:  [phone]  Physical Therapy Treatment  Patient Details  Name: Matthew Kline MRN: [id number] Date of Birth: 27-Dec-1960 Referring Provider (PT): Meridee Score, MD   Encounter Date: 12/20/2017  PT End of Session - 12/20/17 7106    Visit Number  31    Number of Visits  42    Date for PT Re-Evaluation  12/21/17    Authorization Type  [employer]    Authorization Time Period       PT Start Time  [phone]    PT Stop Time  1020    PT Time Calculation (min)  44 min    Equipment Utilized During Treatment  Gait belt    Activity Tolerance  Patient tolerated treatment well;No increased pain    Behavior During Therapy  WFL for tasks assessed/performed       Past Medical History:  Diagnosis Date  . Anemia, unspecified   . Anxiety   . Arthralgia 2010   polyarticular  . Arthritis    "back, knees" (01/10/2017)  . Cancer [facility])    "kidney area" (01/10/2017)  . CHF (congestive heart failure) ([place]) 07/25/2009   denies  . Chronic lower back pain   . Coronary artery disease   . Coughing    pt. reports that he has drainage from sinus infection  . Diabetic foot ulcer ([place])   . Diabetic neuropathy ([place])   . ESRD (end stage renal disease) on dialysis [facility])    started 12/2012; "MWF; [place] "  (01/10/2017)  . GERD (gastroesophageal reflux disease)    hx "before I lost weight", no problem 9 years  . Hemodialysis access site with mature fistula ([place])   . Hemorrhoids, internal 10/2011   small  . High cholesterol   . History of blood transfusion    "related to the anemia"  . Hypertension   . Insomnia, unspecified   . Lacunar infarction ([place]) 2006   RUE/RLE, speech  . Long term (current) use of anticoagulants   . Myocardial infarction ([place]) 1995  . Orthostatic hypotension   . Osteomyelitis of foot, left, acute ([place])   . Other chronic  postoperative pain   . Pneumonia    "probably twice" (01/10/2017)  . Polymyalgia rheumatica ([place])   . Renal insufficiency   . Sleep apnea    "lost weight; no more problem" (01/10/2017)  . Stroke ([place]) 01/10/06   denies residual on 05/09/2014  . Type II diabetes mellitus ([place]) dx'd 1995  . Unspecified hereditary and idiopathic peripheral neuropathy    feet  . Unspecified osteomyelitis, site unspecified   . Unspecified vitamin D deficiency     Past Surgical History:  Procedure Laterality Date  . ABDOMINAL AORTOGRAM N/A 08/25/2016   Procedure: ABDOMINAL AORTOGRAM;  Surgeon: Wellington Hampshire, MD;  Location: [place] CV [facility];  Service: Cardiovascular;  Laterality: N/A;  . AMPUTATION  01/21/2012   Procedure: AMPUTATION RAY;  Surgeon: [name], MD;  Location: [place];  Service: Orthopedics;  Laterality: Left;  Left Foot 4th Ray Amputation  . AMPUTATION Left 05/04/2013   Procedure: AMPUTATION DIGIT;  Surgeon: [name], MD;  Location: [place];  Service: Orthopedics;  Laterality: Left;  Left Great Toe Amputation at [facility]  . AMPUTATION Left 01/14/2017   Procedure: AMPUTATION ABOVE LEFT KNEE;  Surgeon: [name], MD;  Location: [facility];  Service: Orthopedics;  Laterality: Left;  .  ANTERIOR CERVICAL DECOMP/DISCECTOMY FUSION  02/2011  . BACK SURGERY    . BASCILIC VEIN TRANSPOSITION Left 10/19/2012   Procedure: BASCILIC VEIN TRANSPOSITION;  Surgeon: Serafina Mitchell, MD;  Location: [facility];  Service: Vascular;  Laterality: Left;  . CARDIAC CATHETERIZATION     "before bypass"  . CORONARY ARTERY BYPASS GRAFT     x 5 with lima at [place] WITH ANTIBIOTIC SPACERS Left 08/07/2014   Procedure: Replace Left Total Knee Arthroplasty,  Place Antibiotic Spacer;  Surgeon: [name], MD;  Location: [place];  Service: Orthopedics;  Laterality: Left;  . I&D EXTREMITY Left 05/09/2014   Procedure: Irrigation and Debridement Left Knee and Closure of Total Knee  Arthroplasty Incision;  Surgeon: [name], MD;  Location: [facility];  Service: Orthopedics;  Laterality: Left;  . I&D KNEE WITH POLY EXCHANGE Left 05/31/2014   Procedure: IRRIGATION AND DEBRIDEMENT LEFT KNEE, PLACE ANTIBIOTIC BEADS,  POLY EXCHANGE;  Surgeon: [name], MD;  Location: [place];  Service: Orthopedics;  Laterality: Left;  . IRRIGATION AND DEBRIDEMENT KNEE Left 01/12/2017   Procedure: IRRIGATION AND DEBRIDEMENT LEFT KNEE;  Surgeon: [name], MD;  Location: [place];  Service: Orthopedics;  Laterality: Left;  . JOINT REPLACEMENT    . KNEE ARTHROSCOPY Left 08-25-2012  . LOWER EXTREMITY ANGIOGRAPHY Left 08/25/2016   Procedure: Lower Extremity Angiography;  Surgeon: Wellington Hampshire, MD;  Location: [place] [facility];  Service: Cardiovascular;  Laterality: Left;  . PERIPHERAL VASCULAR BALLOON ANGIOPLASTY Left 08/25/2016   Procedure: PERIPHERAL VASCULAR BALLOON ANGIOPLASTY;  Surgeon: Wellington Hampshire, MD;  Location: [place] [facility];  Service: Cardiovascular;  Laterality: Left;  lt peroneal and ant tibial arteries cutting balloon  . REFRACTIVE SURGERY Bilateral   . TOE AMPUTATION Bilateral    "I've lost 7 toes over the last 7 years" (05/09/2014)  . TOE SURGERY Left April 2015   Big toe removed on left foot.  . TONSILLECTOMY    . TOTAL KNEE ARTHROPLASTY Left 04/10/2014   Procedure: TOTAL KNEE ARTHROPLASTY;  Surgeon: [name], MD;  Location: [place];  Service: Orthopedics;  Laterality: Left;  . TOTAL KNEE REVISION Left 10/25/2014   Procedure: LEFT TOTAL KNEE REVISION;  Surgeon: [name], MD;  Location: [place];  Service: Orthopedics;  Laterality: Left;  . TOTAL KNEE REVISION Left 11/26/2015   Procedure: Removal Left Total Knee Arthroplasty, Hinged Total Knee Arthroplasty;  Surgeon: [name], MD;  Location: [facility];  Service: Orthopedics;  Laterality: Left;  . UVULOPALATOPHARYNGOPLASTY, TONSILLECTOMY AND SEPTOPLASTY  ~ 1989  . WOUND DEBRIDEMENT Left 05/09/2014   Dehiscence  Left Total Knee Arthroplasty Incision    There were no vitals filed for this visit.  Subjective Assessment - 12/20/17 0939    Subjective  No falls. He went to ED and they think gall bladder. Recommended general surgeon. Pain medication makes him light headed & difficult to walk.     Pertinent History  Left TFA, ESRD with dialysis M,W,F, CHF, CAD s/p CABG 05/2016, PAD, CVA, DM2, neuropathy, arthritis, right toe amputations    Limitations  Lifting;Standing;Walking;House hold activities    Patient Stated Goals  He wants to use prosthesis to walk without assistance, garden, cook / work in [employer]     Currently in Pain?  Yes    Pain Score  4     Pain Location  Abdomen    Pain Orientation  Right;Left    Pain  Descriptors / Indicators  Sharp    Pain Type  Acute pain    Pain Onset  More than a month ago    Pain Frequency  Constant    Aggravating Factors   GI issues    Pain Relieving Factors  empty stomach         [facility] PT Assessment - 12/20/17 0930      Ambulation/Gait   Ambulation/Gait  Yes    Ambulation/Gait Assistance  5: Supervision;4: Min assist    Ambulation/Gait Assistance Details  initial 36' with cane & supervision; then required minA / hand hold assist for 400'    Ambulation Distance (Feet)  500 Feet   500' outdoors, seated rest, 200' indoors DGI   Assistive device  Prosthesis;L Forearm Crutch;R Forearm Crutch;Crutches;Straight cane;1 person hand held assist   quad tip on cane; arrived / exited on forearm crutches   Gait Pattern  Step-through pattern;Decreased arm swing - left;Decreased step length - right;Decreased stance time - left;Decreased stride length;Decreased hip/knee flexion - left;Decreased weight shift to left;Left hip hike;Antalgic;Lateral hip instability;Trunk flexed;Abducted - left;Poor foot clearance - left    Ambulation Surface  Indoor;Level;Outdoor;Paved    Gait velocity  1.86 ft/sec comfortable with cane & 2.35 ft/sec fast with cane.    Initial was 0.86 ft/sec  with RW     Berg Balance Test   Sit to Stand  Able to stand  independently using hands    Standing Unsupported  Able to stand safely 2 minutes    Sitting with Back Unsupported but Feet Supported on Floor or Stool  Able to sit safely and securely 2 minutes    Stand to Sit  Sits safely with minimal use of hands    Transfers  Able to transfer safely, definite need of hands    Standing Unsupported with Eyes Closed  Able to stand 10 seconds safely    Standing Ubsupported with Feet Together  Able to place feet together independently and stand for 1 minute with supervision    From Standing, Reach Forward with Outstretched Arm  Can reach confidently >25 cm (10")    From Standing Position, Pick up Object from [place] to pick up shoe safely and easily    From Standing Position, Turn to Look Behind Over each Shoulder  Looks behind from both sides and weight shifts well    Turn 360 Degrees  Needs assistance while turning    Standing Unsupported, Alternately Place Feet on Step/Stool  Able to complete >2 steps/needs minimal assist    Standing Unsupported, One Foot in Front  Able to take small step independently and hold 30 seconds    Standing on One Leg  Tries to lift leg/unable to hold 3 seconds but remains standing independently    Total Score  41    Berg comment:  Initial Berg 09/20/17 11/56 & on 09/23/17 37/56      Dynamic Gait Index   Level Surface  Moderate Impairment    Change in Gait Speed  Severe Impairment    Gait with Horizontal Head Turns  Moderate Impairment    Gait with Vertical Head Turns  Moderate Impairment    Gait and Pivot Turn  Moderate Impairment    Step Over Obstacle  Severe Impairment    Step Around Obstacles  Moderate Impairment    Steps  Moderate Impairment    Total Score  6    DGI comment:  today 6/24 with cane quad tip; initial DGI 1/24 with RW  PT Short Term Goals - 11/22/17 0855      PT SHORT TERM GOAL #1   Title   Patient ambulates 300' cane & prosthesis with supervision.     Baseline  11/22/17: Pt ambulates 300 ft with cane and prosthesis with supervision/min guard and LOB x1     Time  4    Period  Weeks    Status  Partially Met      PT SHORT TERM GOAL #2   Title  Patient negotiates ramps & curbs with cane & prosthesis (no hand hold) with minA.    Baseline  10/22/17: Pt negotiates ramps and simulated curb with cane and min A     Time  4    Period  Weeks    Status  Achieved      PT SHORT TERM GOAL #3   Title  Patient descends stairs with 2 rails & prosthesis alternate pattern with supervision.     Baseline  11/22/2017: Pt descends stairs at supervision level with 2 rails/prosthesis.     Time  4    Period  Weeks    Status  Achieved      PT SHORT TERM GOAL #4   Title  Patient able to turn 360* slowly with prosthesis only with supervision.    Baseline  11/22/17: Pt turns 360 slowly at supervision level with prosthesis.     Time  4    Period  Weeks    Status  Achieved      PT SHORT TERM GOAL #5   Title  Patient ambulates 50' with prosthesis only carrying plate with supervision    Baseline  11/22/17: Pt ambulates 20 ft while carrying plate upright with prosthesis only, min guard assistance.     Time  4    Period  Weeks    Status  Not Met        PT Long Term Goals - 12/20/17 1200      PT LONG TERM GOAL #1   Title  Patient verbalizes proper prosthetic care including problem solving issues and when to contact prosthetist. (All updated LTGs Target Date: 12/21/2017)    Baseline  Partially MET 12/20/2017 Patient verbalizes proper care generally but needs PT direction due to significant changes in limb volume with weight loss >10#, typical shrinkage with initial socket & fluid changes with dialysis.     Time  3    Period  Months    Status  Partially Met      PT LONG TERM GOAL #2   Title  Patient tolerates prosthesis wear >90% of awake hours without skin or limb pain issues to enable function  throughout his day.     Baseline  MET 12/20/2017     Time  3    Period  Months    Status  Achieved      PT LONG TERM GOAL #3   Title  Berg Balance with device >45/56 to indicate lower fall risk.     Baseline  MET 12/20/2017 Berg Balance improved to 48/56    Time  3    Period  Months    Status  Achieved      PT LONG TERM GOAL #4   Title  Patient ambulates 1000' outdoors including grass, ramps & curbs with cane or less and prosthesis modified independent.     Baseline  NOT MET 12/20/2017 Patient ambulates 500' outdoors with forearm crutches. With cane he requires minimal assist and hand hold assist after  100'.     Time  3    Period  Months    Status  Not Met      PT LONG TERM GOAL #5   Title  Patient descends stairs with alternate pattern with single rail & cane and negotiates stairs without rails with cane step-to pattern modified independent.     Time  3    Period  Months    Status  On-going    Target Date  12/21/17      PT LONG TERM GOAL #6   Title  Patient ambulates around furniture with prosthesis only carrying light weight household items modified independent.     Time  3    Period  Months    Status  On-going    Target Date  12/21/17            Plan - 12/20/17 1200    Clinical Impression Statement  Patient met or partially met 3 of his LTGs. He did not meet LTG for prosthetic gait with cane or less. He is improving but not met yet. He wants to get back on kidney transplant list which requires him to be ambulating with cane or less. He appears to have potential to achieve this depending on outcome of colonscopy & plan of care for that issue.     Rehab Potential  Good    PT Frequency  2x / week    PT Duration  12 weeks    PT Treatment/Interventions  ADLs/Self Care Home Management;DME Instruction;Gait training;Stair training;Functional mobility training;Therapeutic activities;Therapeutic exercise;Balance training;Neuromuscular re-education;Canalith  Repostioning;Patient/family education;Prosthetic Training;Vestibular    PT Next Visit Plan  check remianing 2 LTGs for recertification, prosthetic gait with cane, balance activities    Consulted and Agree with Plan of Care  Patient       Patient will benefit from skilled therapeutic intervention in order to improve the following deficits and impairments:  Decreased activity tolerance, Abnormal gait, Decreased balance, Decreased endurance, Decreased knowledge of use of DME, Decreased mobility, Decreased strength, Prosthetic Dependency, Postural dysfunction, Dizziness  Visit Diagnosis: Other abnormalities of gait and mobility  Muscle weakness (generalized)  Unsteadiness on feet  Abnormal posture  History of falling     Problem List Patient Active Problem List   Diagnosis Date Noted  . Altered mental status   . Cerebral thrombosis with cerebral infarction 02/06/2017  . Cerebral embolism with cerebral infarction 02/06/2017  . Above knee amputation status, left   . Pressure injury of skin 01/30/2017  . Acute lower UTI 01/29/2017  . Acute metabolic encephalopathy 46/96/2952  . UTI (urinary tract infection) 01/29/2017  . Quadriceps muscle rupture, left, initial encounter   . Fall 01/09/2017  . Gait disturbance 01/09/2017  . Staphylococcus aureus infection 01/09/2017  . Infection of prosthetic left knee joint ([place]) 01/09/2017  . Fever, unknown origin 11/09/2016  . Critical lower limb ischemia 08/25/2016  . Ulcer of left midfoot with fat layer exposed ([place]) 08/13/2016  . Diabetic ulcer of left midfoot associated with type 2 diabetes mellitus, with fat layer exposed ([place]) 07/25/2016  . Peripheral neuropathy 07/22/2016  . Tobacco abuse 07/22/2016  . CAD in native artery 05/18/2016  . CAD, multiple vessel 05/11/2016  . Positive cardiac stress test 05/11/2016  . Abnormal stress test 04/30/2016  . Pre-transplant evaluation for kidney transplant 04/30/2016  . S/P revision of total  knee 11/26/2015  . Pain in the chest   . Acute on chronic diastolic heart failure ([place]) 07/05/2015  . Volume overload  07/04/2015  . Shortness of breath 07/04/2015  . Hypoxemia 07/04/2015  . Elevated troponin   . End-stage renal disease on hemodialysis ([place])   . Hypervolemia   . 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 Pyogenic bacterial arthritis of knee, left ([place]) 08/07/2014  . Tachycardia 07/24/2014  . Acute upper respiratory infection 07/24/2014  . ESRD on dialysis ([place]) 07/14/2014  . Type II diabetes mellitus ([place]) 07/14/2014  . Anemia in chronic kidney disease 07/14/2014  . Congestive heart failure (CHF) ([place]) 07/13/2014  . Surgical wound dehiscence 05/09/2014  . Dehiscence of closure of skin 05/09/2014  . Total knee replacement status 04/10/2014  . Diabetes mellitus with renal manifestations, controlled ([place]) 10/24/2013  . Hypertensive renal disease 06/27/2013  . DM type 2 causing vascular disease ([place]) 06/27/2013  . Erectile dysfunction 06/27/2013  . Depression 06/27/2013  . Claudication of left lower extremity ([place]) 12/19/2012  . Essential hypertension, benign 12/19/2012  . Sinusitis, acute maxillary 11/22/2012  . Otitis, externa, infective 11/14/2012  . Leg edema, left 11/14/2012  . End stage renal disease ([place]) 10/02/2012  . Controlled type 2 DM with proteinuria or microalbuminuria 09/19/2012  . GERD (gastroesophageal reflux disease) 09/19/2012  . Leukocytosis 09/19/2012  . Lacunar infarction ([place]) 08/17/2012  . Polymyalgia rheumatica ([place]) 08/17/2012  . 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 Essential hypertension 05/10/2012  . Vitamin D deficiency 05/10/2012  . Diabetes mellitus due to underlying condition ([place]) 05/10/2012  . Hyperlipidemia LDL goal <100 05/10/2012  . Anemia of chronic disease 05/10/2012  . Screening for prostate cancer 05/10/2012  . Chronic kidney disease (CKD), stage IV (severe) ([place]) 05/10/2012  . Peripheral autonomic neuropathy  due to DM ([place]) 05/10/2012  . Callus of foot 05/10/2012  . Urgency of urination 05/10/2012  . Hyperkalemia 05/10/2012  . Candidiasis of the esophagus 10/12/2011  . Internal hemorrhoids without mention of complication 53/91/2258  . Pre-syncope 07/25/2009  . DJD (degenerative joint disease) of cervical spine 02/17/2009    Nazaret Chea PT, DPT 12/21/2017, 9:00 AM  [facility] [address] [place], [place], [address] Phone: [phone]   Fax:  [phone]  Name: Tarus Briski MRN: [id number] Date of Birth: [birth year]

## 2017-12-22 ENCOUNTER — Ambulatory Visit: Payer: Medicare HMO | Admitting: Physical Therapy

## 2017-12-27 ENCOUNTER — Ambulatory Visit: Payer: Medicare HMO

## 2018-01-02 ENCOUNTER — Encounter (HOSPITAL_COMMUNITY): Payer: Self-pay

## 2018-01-02 ENCOUNTER — Other Ambulatory Visit: Payer: Self-pay

## 2018-01-02 ENCOUNTER — Emergency Department (HOSPITAL_COMMUNITY)
Admission: EM | Admit: 2018-01-02 | Discharge: 2018-01-02 | Disposition: A | Discharge status: Home / Self Care | Payer: Medicare HMO | Attending: Emergency Medicine | Admitting: Emergency Medicine

## 2018-01-02 ENCOUNTER — Emergency Department (HOSPITAL_COMMUNITY): Payer: Medicare HMO

## 2018-01-02 DIAGNOSIS — Z87891 Personal history of nicotine dependence: Secondary | ICD-10-CM | POA: Insufficient documentation

## 2018-01-02 DIAGNOSIS — E1122 Type 2 diabetes mellitus with diabetic chronic kidney disease: Secondary | ICD-10-CM | POA: Diagnosis not present

## 2018-01-02 DIAGNOSIS — R10817 Generalized abdominal tenderness: Secondary | ICD-10-CM | POA: Insufficient documentation

## 2018-01-02 DIAGNOSIS — Z992 Dependence on renal dialysis: Secondary | ICD-10-CM | POA: Diagnosis not present

## 2018-01-02 DIAGNOSIS — Z79899 Other long term (current) drug therapy: Secondary | ICD-10-CM | POA: Diagnosis not present

## 2018-01-02 DIAGNOSIS — R05 Cough: Secondary | ICD-10-CM | POA: Insufficient documentation

## 2018-01-02 DIAGNOSIS — I132 Hypertensive heart and chronic kidney disease with heart failure and with stage 5 chronic kidney disease, or end stage renal disease: Secondary | ICD-10-CM | POA: Diagnosis not present

## 2018-01-02 DIAGNOSIS — R509 Fever, unspecified: Secondary | ICD-10-CM | POA: Insufficient documentation

## 2018-01-02 DIAGNOSIS — R1011 Right upper quadrant pain: Secondary | ICD-10-CM | POA: Diagnosis not present

## 2018-01-02 DIAGNOSIS — N186 End stage renal disease: Secondary | ICD-10-CM | POA: Diagnosis not present

## 2018-01-02 DIAGNOSIS — I5032 Chronic diastolic (congestive) heart failure: Secondary | ICD-10-CM | POA: Diagnosis not present

## 2018-01-02 LAB — COMPREHENSIVE METABOLIC PANEL
ALT: 11 U/L (ref 0–44)
AST: 14 U/L — ABNORMAL LOW (ref 15–41)
Albumin: 3.4 g/dL — ABNORMAL LOW (ref 3.5–5.0)
Alkaline Phosphatase: 52 U/L (ref 38–126)
Anion gap: 12 (ref 5–15)
BUN: 8 mg/dL (ref 6–20)
CO2: 31 mmol/L (ref 22–32)
Calcium: 8.7 mg/dL — ABNORMAL LOW (ref 8.9–10.3)
Chloride: 95 mmol/L — ABNORMAL LOW (ref 98–111)
Creatinine, Ser: 3.94 mg/dL — ABNORMAL HIGH (ref 0.61–1.24)
GFR calc Af Amer: 18 mL/min — ABNORMAL LOW (ref >60)
GFR calc non Af Amer: 16 mL/min — ABNORMAL LOW (ref >60)
Glucose, Bld: 102 mg/dL — ABNORMAL HIGH (ref 70–99)
Potassium: 4 mmol/L (ref 3.5–5.1)
Sodium: 138 mmol/L (ref 135–145)
Total Bilirubin: 1 mg/dL (ref 0.3–1.2)
Total Protein: 7.9 g/dL (ref 6.5–8.1)

## 2018-01-02 LAB — CBC WITH DIFFERENTIAL/PLATELET
Abs Immature Granulocytes: 0.02 10*3/uL (ref 0.00–0.07)
Basophils Absolute: 0 10*3/uL (ref 0.0–0.1)
Basophils Relative: 1 %
Eosinophils Absolute: 0.1 10*3/uL (ref 0.0–0.5)
Eosinophils Relative: 2 %
HCT: 33.7 % — ABNORMAL LOW (ref 39.0–52.0)
Hemoglobin: 10.4 g/dL — ABNORMAL LOW (ref 13.0–17.0)
Immature Granulocytes: 0 %
Lymphocytes Relative: 26 %
Lymphs Abs: 1.7 10*3/uL (ref 0.7–4.0)
MCH: 31 pg (ref 26.0–34.0)
MCHC: 30.9 g/dL (ref 30.0–36.0)
MCV: 100.6 fL — ABNORMAL HIGH (ref 80.0–100.0)
Monocytes Absolute: 0.6 10*3/uL (ref 0.1–1.0)
Monocytes Relative: 9 %
Neutro Abs: 4.2 10*3/uL (ref 1.7–7.7)
Neutrophils Relative %: 62 %
Platelets: 196 10*3/uL (ref 150–400)
RBC: 3.35 MIL/uL — ABNORMAL LOW (ref 4.22–5.81)
RDW: 13.7 % (ref 11.5–15.5)
WBC: 6.6 10*3/uL (ref 4.0–10.5)
nRBC: 0 % (ref 0.0–0.2)

## 2018-01-02 LAB — INFLUENZA PANEL BY PCR (TYPE A & B)
Influenza A By PCR: NEGATIVE
Influenza B By PCR: NEGATIVE

## 2018-01-02 LAB — I-STAT CG4 LACTIC ACID, ED
Lactic Acid, Venous: 1.12 mmol/L (ref 0.5–1.9)
Lactic Acid, Venous: 1.41 mmol/L (ref 0.5–1.9)

## 2018-01-02 MED ORDER — ACETAMINOPHEN 500 MG PO TABS
1000.0000 mg | ORAL_TABLET | Freq: Once | ORAL | Status: AC
  Administered 2018-01-02: 1000 mg via ORAL
  Filled 2018-01-02: qty 2

## 2018-01-02 MED ORDER — KETOROLAC TROMETHAMINE 15 MG/ML IJ SOLN
15.0000 mg | Freq: Once | INTRAMUSCULAR | Status: AC
  Administered 2018-01-02: 15 mg via INTRAVENOUS
  Filled 2018-01-02: qty 1

## 2018-01-02 MED ORDER — DIPHENHYDRAMINE HCL 50 MG/ML IJ SOLN
25.0000 mg | Freq: Once | INTRAMUSCULAR | Status: AC
  Administered 2018-01-02: 25 mg via INTRAVENOUS
  Filled 2018-01-02: qty 1

## 2018-01-02 MED ORDER — SODIUM CHLORIDE 0.9 % IV BOLUS: 500.0000 mL | Freq: Once | INTRAVENOUS | Status: DC

## 2018-01-02 MED ORDER — FENTANYL CITRATE (PF) 100 MCG/2ML IJ SOLN
50.0000 ug | Freq: Once | INTRAMUSCULAR | Status: AC
  Administered 2018-01-02: 50 ug via INTRAVENOUS
  Filled 2018-01-02: qty 2

## 2018-01-02 MED ORDER — METOCLOPRAMIDE HCL 5 MG/ML IJ SOLN
10.0000 mg | Freq: Once | INTRAMUSCULAR | Status: AC
  Administered 2018-01-02: 10 mg via INTRAVENOUS
  Filled 2018-01-02: qty 2

## 2018-01-02 NOTE — ED Notes (Signed)
Patient transported to X-ray 

## 2018-01-02 NOTE — ED Provider Notes (Signed)
[facility] EMERGENCY DEPARTMENT Provider Note   CSN: [id number] Arrival date & time: 01/02/18  1246     History   Chief Complaint Chief Complaint  Patient presents with  . Fever    HPI Matthew Kline is a 57 y.o. male with history of CHF, ESRD on dialysis Monday Wednesday Friday, diabetes, hypertension, polymyalgia rheumatica, CVA presents for evaluation of acute onset, progressively worsening fever.  He notes that for the past several days he has been feeling unwell and fatigued.  Notes subjective fevers and chills but did not check his temperature until today at dialysis where he had a fever of 101.8 F.  He notes that for the last week or so he has had a cough productive of clear sputum, denies chest pain or shortness of breath.  He notes chronic intermittent sharp upper abdominal pain worst in the right upper quadrant which is currently being managed by his gastroenterologist.  He underwent colonoscopy 4 days ago on Thursday which he tolerated without difficulty and was told that he had "an ulcer in my colon and some abnormal mucosa so they took biopsies ".  Denies any nausea or vomiting and has been non-melanotic experiencing loose stools for the last several weeks.  He does note dysuria and still produces urine typically once daily or every other day.  He received 1500 mg vancomycin and 2 g ceftazidime at dialysis today as well as 1 g of Tylenol and upon recheck of his temperature was 98 F.  The history is provided by the patient.    Past Medical History:  Diagnosis Date  . Anemia, unspecified   . Anxiety   . Arthralgia 2010   polyarticular  . Arthritis    "back, knees" (01/10/2017)  . Cancer [facility])    "kidney area" (01/10/2017)  . CHF (congestive heart failure) ([place]) 07/25/2009   denies  . Chronic lower back pain   . Coronary artery disease   . Coughing    pt. reports that he has drainage from sinus infection  . Diabetic foot ulcer ([place])   .  Diabetic neuropathy ([place])   . ESRD (end stage renal disease) on dialysis [facility])    started 12/2012; "MWF; [place] "  (01/10/2017)  . GERD (gastroesophageal reflux disease)    hx "before I lost weight", no problem 9 years  . Hemodialysis access site with mature fistula ([place])   . Hemorrhoids, internal 10/2011   small  . High cholesterol   . History of blood transfusion    "related to the anemia"  . Hypertension   . Insomnia, unspecified   . Lacunar infarction ([place]) 2006   RUE/RLE, speech  . Long term (current) use of anticoagulants   . Myocardial infarction ([place]) 1995  . Orthostatic hypotension   . Osteomyelitis of foot, left, acute ([place])   . Other chronic postoperative pain   . Pneumonia    "probably twice" (01/10/2017)  . Polymyalgia rheumatica ([place])   . Renal insufficiency   . Sleep apnea    "lost weight; no more problem" (01/10/2017)  . Stroke ([place]) 01/10/06   denies residual on 05/09/2014  . Type II diabetes mellitus ([place]) dx'd 1995  . Unspecified hereditary and idiopathic peripheral neuropathy    feet  . Unspecified osteomyelitis, site unspecified   . Unspecified vitamin D deficiency     Patient Active Problem List   Diagnosis Date Noted  . Altered mental status   . Cerebral thrombosis with cerebral infarction 02/06/2017  .  Cerebral embolism with cerebral infarction 02/06/2017  . Above knee amputation status, left   . Pressure injury of skin 01/30/2017  . Acute lower UTI 01/29/2017  . Acute metabolic encephalopathy 72/53/6644  . UTI (urinary tract infection) 01/29/2017  . Quadriceps muscle rupture, left, initial encounter   . Fall 01/09/2017  . Gait disturbance 01/09/2017  . Staphylococcus aureus infection 01/09/2017  . Infection of prosthetic left knee joint ([place]) 01/09/2017  . Fever, unknown origin 11/09/2016  . Critical lower limb ischemia 08/25/2016  . Ulcer of left midfoot with fat layer exposed ([place]) 08/13/2016  . Diabetic ulcer of left midfoot  associated with type 2 diabetes mellitus, with fat layer exposed ([place]) 07/25/2016  . Peripheral neuropathy 07/22/2016  . Tobacco abuse 07/22/2016  . CAD in native artery 05/18/2016  . CAD, multiple vessel 05/11/2016  . Positive cardiac stress test 05/11/2016  . Abnormal stress test 04/30/2016  . Pre-transplant evaluation for kidney transplant 04/30/2016  . S/P revision of total knee 11/26/2015  . Pain in the chest   . Acute on chronic diastolic heart failure ([place]) 07/05/2015  . Volume overload 07/04/2015  . Shortness of breath 07/04/2015  . Hypoxemia 07/04/2015  . Elevated troponin   . End-stage renal disease on hemodialysis ([place])   . Hypervolemia   . 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 Pyogenic bacterial arthritis of knee, left ([place]) 08/07/2014  . Tachycardia 07/24/2014  . Acute upper respiratory infection 07/24/2014  . ESRD on dialysis ([place]) 07/14/2014  . Type II diabetes mellitus ([place]) 07/14/2014  . Anemia in chronic kidney disease 07/14/2014  . Congestive heart failure (CHF) ([place]) 07/13/2014  . Surgical wound dehiscence 05/09/2014  . Dehiscence of closure of skin 05/09/2014  . Total knee replacement status 04/10/2014  . Diabetes mellitus with renal manifestations, controlled ([place]) 10/24/2013  . Hypertensive renal disease 06/27/2013  . DM type 2 causing vascular disease ([place]) 06/27/2013  . Erectile dysfunction 06/27/2013  . Depression 06/27/2013  . Claudication of left lower extremity ([place]) 12/19/2012  . Essential hypertension, benign 12/19/2012  . Sinusitis, acute maxillary 11/22/2012  . Otitis, externa, infective 11/14/2012  . Leg edema, left 11/14/2012  . End stage renal disease ([place]) 10/02/2012  . Controlled type 2 DM with proteinuria or microalbuminuria 09/19/2012  . GERD (gastroesophageal reflux disease) 09/19/2012  . Leukocytosis 09/19/2012  . Lacunar infarction ([place]) 08/17/2012  . Polymyalgia rheumatica ([place]) 08/17/2012  . 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 Essential hypertension 05/10/2012  . Vitamin D deficiency 05/10/2012  . Diabetes mellitus due to underlying condition ([place]) 05/10/2012  . Hyperlipidemia LDL goal <100 05/10/2012  . Anemia of chronic disease 05/10/2012  . Screening for prostate cancer 05/10/2012  . Chronic kidney disease (CKD), stage IV (severe) ([place]) 05/10/2012  . Peripheral autonomic neuropathy due to DM ([place]) 05/10/2012  . Callus of foot 05/10/2012  . Urgency of urination 05/10/2012  . Hyperkalemia 05/10/2012  . Candidiasis of the esophagus 10/12/2011  . Internal hemorrhoids without mention of complication 03/47/4259  . Pre-syncope 07/25/2009  . DJD (degenerative joint disease) of cervical spine 02/17/2009    Past Surgical History:  Procedure Laterality Date  . ABDOMINAL AORTOGRAM N/A 08/25/2016   Procedure: ABDOMINAL AORTOGRAM;  Surgeon: Wellington [place], MD;  Location: [place] CV LAB;  Service: Cardiovascular;  Laterality: N/A;  . AMPUTATION  01/21/2012   Procedure: AMPUTATION RAY;  Surgeon: [name], MD;  Location: [facility];  Service: Orthopedics;  Laterality: Left;  Left Foot 4th Ray Amputation  .  AMPUTATION Left 05/04/2013   Procedure: AMPUTATION DIGIT;  Surgeon: [name], MD;  Location: [facility];  Service: Orthopedics;  Laterality: Left;  Left Great Toe Amputation at [facility]  . AMPUTATION Left 01/14/2017   Procedure: AMPUTATION ABOVE LEFT KNEE;  Surgeon: [name], MD;  Location: [facility];  Service: Orthopedics;  Laterality: Left;  . ANTERIOR CERVICAL DECOMP/DISCECTOMY FUSION  02/2011  . BACK SURGERY    . BASCILIC VEIN TRANSPOSITION Left 10/19/2012   Procedure: BASCILIC VEIN TRANSPOSITION;  Surgeon: Serafina Mitchell, MD;  Location: [facility];  Service: Vascular;  Laterality: Left;  . CARDIAC CATHETERIZATION     "before bypass"  . CORONARY ARTERY BYPASS GRAFT     x 5 with lima at [facility] WITH ANTIBIOTIC SPACERS Left 08/07/2014   Procedure: Replace Left  Total Knee Arthroplasty,  Place Antibiotic Spacer;  Surgeon: [name], MD;  Location: [facility];  Service: Orthopedics;  Laterality: Left;  . I&D EXTREMITY Left 05/09/2014   Procedure: Irrigation and Debridement Left Knee and Closure of Total Knee Arthroplasty Incision;  Surgeon: [name], MD;  Location: [facility];  Service: Orthopedics;  Laterality: Left;  . I&D KNEE WITH POLY EXCHANGE Left 05/31/2014   Procedure: IRRIGATION AND DEBRIDEMENT LEFT KNEE, PLACE ANTIBIOTIC BEADS,  POLY EXCHANGE;  Surgeon: [name], MD;  Location: [facility];  Service: Orthopedics;  Laterality: Left;  . IRRIGATION AND DEBRIDEMENT KNEE Left 01/12/2017   Procedure: IRRIGATION AND DEBRIDEMENT LEFT KNEE;  Surgeon: [name], MD;  Location: [facility];  Service: Orthopedics;  Laterality: Left;  . JOINT REPLACEMENT    . KNEE ARTHROSCOPY Left 08-25-2012  . LOWER EXTREMITY ANGIOGRAPHY Left 08/25/2016   Procedure: Lower Extremity Angiography;  Surgeon: Wellington [place], MD;  Location: [facility] CV LAB;  Service: Cardiovascular;  Laterality: Left;  . PERIPHERAL VASCULAR BALLOON ANGIOPLASTY Left 08/25/2016   Procedure: PERIPHERAL VASCULAR BALLOON ANGIOPLASTY;  Surgeon: Wellington [place], MD;  Location: [facility] CV LAB;  Service: Cardiovascular;  Laterality: Left;  lt peroneal and ant tibial arteries cutting balloon  . REFRACTIVE SURGERY Bilateral   . TOE AMPUTATION Bilateral    "I've lost 7 toes over the last 7 years" (05/09/2014)  . TOE SURGERY Left April 2015   Big toe removed on left foot.  . TONSILLECTOMY    . TOTAL KNEE ARTHROPLASTY Left 04/10/2014   Procedure: TOTAL KNEE ARTHROPLASTY;  Surgeon: [name], MD;  Location: [facility];  Service: Orthopedics;  Laterality: Left;  . TOTAL KNEE REVISION Left 10/25/2014   Procedure: LEFT TOTAL KNEE REVISION;  Surgeon: [name], MD;  Location: [facility];  Service: Orthopedics;  Laterality: Left;  . TOTAL KNEE REVISION Left 11/26/2015   Procedure: Removal Left Total Knee  Arthroplasty, Hinged Total Knee Arthroplasty;  Surgeon: [name], MD;  Location: [facility];  Service: Orthopedics;  Laterality: Left;  . UVULOPALATOPHARYNGOPLASTY, TONSILLECTOMY AND SEPTOPLASTY  ~ 1989  . WOUND DEBRIDEMENT Left 05/09/2014   Dehiscence Left Total Knee Arthroplasty Incision        Home Medications    Prior to Admission medications   Medication Sig Start Date End Date Taking? Authorizing Provider  amLODipine (NORVASC) 5 MG tablet Take 5 mg by mouth daily.    Yes [provider]  carvedilol (COREG) 12.5 MG tablet Take 1 tablet (12.5 mg total) by mouth 2 (two) times daily with a meal. Patient taking differently: Take 6.25 mg by mouth 2 (  two) times daily with a meal.  02/08/17  Yes Bonnell Public, MD  Darbepoetin Alfa (ARANESP) 150 MCG/0.3ML SOSY injection Inject 0.3 mLs (150 mcg total) into the vein every Friday with hemodialysis. 02/11/17  Yes Bonnell Public, MD  dicyclomine (BENTYL) 10 MG capsule Take 10 mg by mouth daily as needed for spasms.   Yes [provider]  gabapentin (NEURONTIN) 300 MG capsule TAKE 1 CAPSULE BY MOUTH THREE TIMES A DAY Patient taking differently: Take 300 mg by mouth 3 (three) times daily.  10/24/17  Yes [name], MD  montelukast (SINGULAIR) 10 MG tablet TAKE 1 TABLET (10 MG TOTAL) BY MOUTH AT BEDTIME. Patient taking differently: Take 10 mg by mouth at bedtime.  09/01/15  Yes Gildardo Cranker, DO  multivitamin (RENA-VIT) TABS tablet Take 1 tablet by mouth at bedtime. 02/08/17  Yes Bonnell Public, MD  ondansetron (ZOFRAN ODT) 4 MG disintegrating tablet Take 1 tablet (4 mg total) by mouth every 8 (eight) hours as needed for nausea or vomiting. 12/16/17  Yes Kinnie Feil, PA-C  sevelamer carbonate (RENVELA) 800 MG tablet Take 800-2,400 mg by mouth 3 (three) times daily with meals.   Yes [provider]  temazepam (RESTORIL) 15 MG capsule Take 30 mg by mouth at bedtime as needed for sleep.  02/28/17  Yes  [provider]  traMADol (ULTRAM) 50 MG tablet Take 1 tablet (50 mg total) by mouth every 6 (six) hours as needed. 12/16/17  Yes Carmon Sails J, PA-C  Amino Acids-Protein Hydrolys (FEEDING SUPPLEMENT, PRO-STAT SUGAR FREE 64,) LIQD Take 30 mLs by mouth 2 (two) times daily. Patient not taking: Reported on 11/30/2017 01/19/17   Patrecia Pour, Christean Grief, MD  aspirin EC 81 MG EC tablet Take 2 tablets (162 mg total) by mouth daily. Patient not taking: Reported on 11/30/2017 01/20/17   Patrecia Pour, Christean Grief, MD  ciprofloxacin (CIPRO) 250 MG tablet Take 250 mg by mouth daily.    [provider]  ferric citrate (AURYXIA) 1 GM 210 MG(Fe) tablet Take 2 tablets (420 mg total) by mouth 3 (three) times daily with meals. Patient not taking: Reported on 11/30/2017 02/08/17   Dana Allan I, MD  metroNIDAZOLE (FLAGYL) 500 MG tablet Take 500 mg by mouth 3 (three) times daily.    [provider]  Nutritional Supplements (FEEDING SUPPLEMENT, NEPRO CARB STEADY,) LIQD Take 237 mLs by mouth 2 (two) times daily between meals. Patient not taking: Reported on 11/30/2017 01/19/17   Patrecia Pour, Christean Grief, MD  pentoxifylline (TRENTAL) 400 MG CR tablet TAKE 1 TABLET (400 MG TOTAL) BY MOUTH 3 (THREE) TIMES DAILY WITH MEALS. Patient not taking: Reported on 12/16/2017 11/24/17   [name], MD    Family History Family History  Problem Relation Age of Onset  . Hypertension Mother   . Cancer Mother 26       Ovarian  . Heart disease Maternal Aunt   . Stroke Maternal Grandfather     Social History Social History   Tobacco Use  . Smoking status: Former Smoker    Packs/day: 0.12    Years: 32.00    Pack years: 3.84    Types: Cigarettes    Last attempt to quit: 10/05/2016    Years since quitting: 1.2  . Smokeless tobacco: Never Used  Substance Use Topics  . Alcohol use: No    Alcohol/week: 0.0 standard drinks  . Drug use: No     Allergies   Morphine and related and Tygacil  [  tigecycline]   Review of Systems Review of Systems  Constitutional: Positive for chills, fatigue and fever.  Respiratory: Positive for cough. Negative for shortness of breath.   Cardiovascular: Negative for chest pain.  Gastrointestinal: Positive for abdominal pain and diarrhea. Negative for nausea and vomiting.  Genitourinary: Positive for dysuria and frequency. Negative for hematuria.  Neurological: Positive for headaches. Negative for syncope.  All other systems reviewed and are negative.    Physical Exam Updated Vital Signs BP (!) 153/91   Pulse 89   Temp 98.1 F (36.7 C) (Oral)   Resp 16   Ht 5\' 10"  (1.778 m)   Wt 76.4 kg   SpO2 96%   BMI 24.17 kg/m   Physical Exam Vitals signs and nursing note reviewed.  Constitutional:      General: He is not in acute distress.    Appearance: He is well-developed.  HENT:     Head: Normocephalic and atraumatic.  Eyes:     General:        Right eye: No discharge.        Left eye: No discharge.     Conjunctiva/sclera: Conjunctivae normal.  Neck:     Musculoskeletal: Normal range of motion and neck supple.     Vascular: No JVD.     Trachea: No tracheal deviation.  Cardiovascular:     Rate and Rhythm: Normal rate.     Comments: Dialysis fistula in left upper extremity with palpable thrill.  Left AKA. Pulmonary:     Effort: Pulmonary effort is normal.     Comments: Bibasilar crackles noted.  Speaking in full sentences without difficulty.  SPO2 saturations 99% on room air Abdominal:     General: Bowel sounds are normal. There is no distension.     Tenderness: There is generalized abdominal tenderness and tenderness in the right upper quadrant. There is no right CVA tenderness, left CVA tenderness or guarding. Negative signs include Murphy's sign.     Comments: Maximally tender to palpation in the right upper quadrant.  Patient notes this is chronic and unchanged.  Skin:    Findings: No erythema.  Neurological:     Mental  Status: He is alert.  Psychiatric:        Behavior: Behavior normal.      ED Treatments / Results  Labs (all labs ordered are listed, but only abnormal results are displayed) Labs Reviewed  COMPREHENSIVE METABOLIC PANEL - Abnormal; Notable for the following components:      Result Value   Chloride 95 (*)    Glucose, Bld 102 (*)    Creatinine, Ser 3.94 (*)    Calcium 8.7 (*)    Albumin 3.4 (*)    AST 14 (*)    GFR calc non Af Amer 16 (*)    GFR calc Af Amer 18 (*)    All other components within normal limits  CBC WITH DIFFERENTIAL/PLATELET - Abnormal; Notable for the following components:   RBC 3.35 (*)    Hemoglobin 10.4 (*)    HCT 33.7 (*)    MCV 100.6 (*)    All other components within normal limits  CULTURE, BLOOD (ROUTINE X 2)  CULTURE, BLOOD (ROUTINE X 2)  GASTROINTESTINAL PANEL BY PCR, STOOL (REPLACES STOOL CULTURE)  INFLUENZA PANEL BY PCR (TYPE A & B)  I-STAT CG4 LACTIC ACID, ED  I-STAT CG4 LACTIC ACID, ED    EKG EKG Interpretation  Date/Time:  Monday January 02 2018 14:31:23 EST Ventricular Rate:  75 PR  Interval:    QRS Duration: 85 QT Interval:  476 QTC Calculation: 532 R Axis:   67 Text Interpretation:  Sinus rhythm Abnormal T, consider ischemia, lateral leads Prolonged QT interval No STEMI.  Confirmed by Nanda Quinton [phone]) on 01/03/2018 10:15:04 AM   Radiology Ct Abdomen Pelvis Wo Contrast  Result Date: 01/02/2018 CLINICAL DATA:  Recent colonoscopy with abdominal pain and fevers EXAM: CT ABDOMEN AND PELVIS WITHOUT CONTRAST TECHNIQUE: Multidetector CT imaging of the abdomen and pelvis was performed following the standard protocol without IV contrast. COMPARISON:  Ultrasound from 12/16/2017 and CT from 11/30/2017 FINDINGS: Lower chest: Mild scarring is noted in the bases bilaterally. Hepatobiliary: Gallbladder is well distended with dependent gallstones stable from the prior exam. No specific liver abnormality is noted. Pancreas: Unremarkable. No  pancreatic ductal dilatation or surrounding inflammatory changes. Spleen: Normal in size without focal abnormality. Adrenals/Urinary Tract: Adrenal glands are within normal limits. Diffuse vascular calcifications of the kidneys are seen. Stable nonobstructing renal calculus is noted on the right measuring approximately 4 mm. No obstructive changes are seen. The bladder is partially distended. Stomach/Bowel: The appendix is not well visualized although no inflammatory changes to suggest appendicitis are seen. The large and small bowel show no significant inflammatory or obstructive changes. No findings to suggest perforation from the recent colonoscopy are seen. Vascular/Lymphatic: Aortic atherosclerosis. No enlarged abdominal or pelvic lymph nodes. Reproductive: Prostate is unremarkable. Other: No abdominal wall hernia or abnormality. No abdominopelvic ascites. Musculoskeletal: Degenerative changes of lumbar spine are seen. No acute bony abnormality is noted. IMPRESSION: Stable cholelithiasis similar to that seen on prior CT examination. Nonobstructing right renal stone. Nonvisualization of the appendix although no inflammatory changes are seen. No acute abnormality noted. Electronically Signed   By: Inez Catalina M.D.   On: 01/02/2018 19:21   Dg Chest 2 View  Result Date: 01/02/2018 CLINICAL DATA:  Dry cough for 1 week, fever since yesterday, diabetes mellitus, occasional smoker EXAM: CHEST - 2 VIEW COMPARISON:  12/16/2017 FINDINGS: Mild enlargement of cardiac silhouette post median sternotomy. Mediastinal contours and pulmonary vascularity normal. Chronic accentuation of RIGHT infrahilar markings unchanged question chronic atelectasis. Minimal atelectasis or scarring at lingula. No acute infiltrate, pleural effusion or pneumothorax. Bones demineralized with evidence of prior cervical spine fusion. IMPRESSION: Minimal enlargement of cardiac silhouette. Chronic RIGHT basilar atelectasis without acute infiltrate.  Electronically Signed   By: Lavonia Dana M.D.   On: 01/02/2018 14:17   US Abdomen Limited Ruq  Result Date: 01/02/2018 CLINICAL DATA:  Right upper quadrant abdominal pain, cholelithiasis on CT EXAM: ULTRASOUND ABDOMEN LIMITED RIGHT UPPER QUADRANT COMPARISON:  CT abdomen/pelvis dated 01/02/2018 FINDINGS: Gallbladder: Layering gallstones measuring up to 15 mm. No gallbladder wall thickening or pericholecystic fluid. Negative sonographic Murphy's sign. Common bile duct: Diameter: 4 mm Liver: Hyperechoic hepatic parenchyma, suggesting hepatic steatosis. No focal hepatic lesion is seen. Portal vein is patent on color Doppler imaging with normal direction of blood flow towards the liver. IMPRESSION: Cholelithiasis, without associated inflammatory changes to suggest acute cholecystitis. Suspected hepatic steatosis. Electronically Signed   By: Julian Hy M.D.   On: 01/02/2018 20:06    Procedures Procedures (including critical care time)  Medications Ordered in ED Medications  acetaminophen (TYLENOL) tablet 1,000 mg (1,000 mg Oral Given 01/02/18 1637)  fentaNYL (SUBLIMAZE) injection 50 mcg (50 mcg Intravenous Given 01/02/18 1637)  ketorolac (TORADOL) 15 MG/ML injection 15 mg (15 mg Intravenous Given 01/02/18 2049)  metoCLOPramide (REGLAN) injection 10 mg (10 mg Intravenous Given 01/02/18 2052)  diphenhydrAMINE (  BENADRYL) injection 25 mg (25 mg Intravenous Given 01/02/18 2046)     Initial Impression / Assessment and Plan / ED Course  I have reviewed the triage vital signs and the nursing notes.  Pertinent labs & imaging results that were available during my care of the patient were reviewed by me and considered in my medical decision making (see chart for details).     Patient sent from dialysis for evaluation of fever.  Febrile at dialysis with a temperature of 101.8 F, afebrile here and vital signs otherwise stable.  He is nontoxic in appearance.  He notes multiple complaints that appear  to have been ongoing for some time but he did have a colonoscopy recently with gastroenterology and is complaining of upper abdominal pain.  We will obtain lab work and CT to rule out microperforation or other acute surgical abdominal pathology.  He is also complaining of a headache but no focal neurologic deficits and no meningeal signs noted on examination.  Doubt CVA, ICH, SAH, or meningitis.  Will give medications to make him more comfortable and reassess.  Lab work reviewed by me shows no leukocytosis, stable anemia, no metabolic derangements.  His creatinine is elevated but his BUN is within normal limits and there is no indication for emergent dialysis at this time given his lab work and a stable chest x-ray.  No evidence of pneumonia.  His influenza test is negative.  Blood cultures were obtained preemptively.  His LFTs are stable and right upper quadrant ultrasound shows cholelithiasis without evidence of cholecystitis.  Imaging of the abdomen and pelvis shows stable cholelithiasis with no evidence of acute surgical abdominal pathology including obstruction, perforation, appendicitis, or dissection.  On reevaluation patient is resting comfortably in no apparent distress.  He reports his headache is much improved after medications and he is tolerating p.o. fluids without difficulty.  He was unable to give [place] a urine sample or stool sample and there is some concern that he may have a UTI or colitis which could be causing his fever.  He does not wish to stay any longer and would like to go home.  He understands that his work-up is not been entirely completed and given his comorbidities we would potentially recommend admission for further evaluation and management.  He understands that he is assuming some risk in going home without a full work-up including potential morbidity and mortality.  However, he states "I feel pretty okay.  I think I just have a cold.  A lot of my symptoms have been going on for a  long time and we have been able to figure out why ".  He feels comfortable with follow-up with his PCP for stool sample and UA for further evaluation and he knows to follow-up with his gastroenterologist for reevaluation of his recent colonoscopy.  I also recommended follow-up with GI for possible HIDA scan or MRCP and potential follow-up with general surgery on an outpatient basis to discuss possible cholecystectomy.  I have discussed red ED return precautions.  Patient and his visitor Ray verbalized understanding of and agreement with plan and patient is stable for discharge home at this time.  Patient was seen and evaluate Dr. Ellender Hose who agrees with assessment and plan.  Final Clinical Impressions(s) / ED Diagnoses   Final diagnoses:  RUQ pain  Fever in adult    ED Discharge Orders    None       Renita Papa, PA-C 01/03/18 1435    Isaacs,  Lysbeth Galas, MD 01/05/18 2015

## 2018-01-02 NOTE — ED Notes (Signed)
Patient verbalizes understanding of discharge instructions. Opportunity for questioning and answers were provided. Armband removed by staff, pt discharged from ED. Pt taken home by friend. Pt wheeled to lobby.

## 2018-01-02 NOTE — ED Notes (Signed)
Pt given fluids and food at this time.

## 2018-01-02 NOTE — ED Notes (Signed)
Patient transported to CT 

## 2018-01-02 NOTE — ED Notes (Signed)
ED Provider at bedside. 

## 2018-01-02 NOTE — Discharge Instructions (Signed)
Take Tylenol every 6 hours as needed for fever and pain.  Drink plenty water and get plenty of rest.  We were unable to get a urine sample or stool sample from you.  Call your primary care physician tomorrow and set up testing for these on an outpatient basis.  Your blood cultures will result in the next 48 hours or so.  You will receive a phone call if your cultures are abnormal and you will likely need to come back to the hospital for IV antibiotics.  Follow-up with gastroenterology or general surgery for reevaluation of your gallstones.  Gastroenterology may want to do a test called a HIDA scan or MRCP which can check the function of your gallbladder.  Return to the emergency department immediately if any concerning signs or symptoms develop such as severe headaches, persistent vomiting, chest pains, shortness of breath, etc.

## 2018-01-02 NOTE — ED Triage Notes (Signed)
Per [facility], pt arrives from dialysis where they found he had a fever of 101.8. Pt did complete dialysis (restricted on left side). Dialysis staff gave 1000 mg of tylenol, 1500 mg of vancomycin, and 2g of tazicef during dialysis. Pt rechecked temperature was 98. Pt is alert and oriented. Pt complains of generalized malaise since yesterday,cough wit mucous production, headache x1 week, dysuria last night (hx of UTI), and diarrhea x1-2 weeks which he had colonoscopy last Thursday for. Hx of left AKA, gallstones, stroke.

## 2018-01-02 NOTE — ED Notes (Signed)
Pt returned from US

## 2018-01-03 ENCOUNTER — Ambulatory Visit: Payer: Medicare HMO

## 2018-01-05 ENCOUNTER — Encounter: Payer: Medicare HMO | Admitting: Physical Therapy

## 2018-01-07 LAB — CULTURE, BLOOD (ROUTINE X 2)
Culture: NO GROWTH
Culture: NO GROWTH
Special Requests: ADEQUATE
Special Requests: ADEQUATE

## 2018-01-10 ENCOUNTER — Ambulatory Visit: Payer: Medicare HMO | Admitting: Physical Therapy

## 2018-01-12 ENCOUNTER — Ambulatory Visit: Payer: Medicare HMO | Attending: Orthopedic Surgery | Admitting: Physical Therapy

## 2018-01-12 DIAGNOSIS — R293 Abnormal posture: Secondary | ICD-10-CM | POA: Insufficient documentation

## 2018-01-12 DIAGNOSIS — M6281 Muscle weakness (generalized): Secondary | ICD-10-CM | POA: Insufficient documentation

## 2018-01-12 DIAGNOSIS — R2689 Other abnormalities of gait and mobility: Secondary | ICD-10-CM | POA: Insufficient documentation

## 2018-01-12 DIAGNOSIS — Z9181 History of falling: Secondary | ICD-10-CM | POA: Insufficient documentation

## 2018-01-12 DIAGNOSIS — R2681 Unsteadiness on feet: Secondary | ICD-10-CM | POA: Insufficient documentation

## 2018-01-17 ENCOUNTER — Other Ambulatory Visit (INDEPENDENT_AMBULATORY_CARE_PROVIDER_SITE_OTHER): Payer: Self-pay | Admitting: Physician Assistant

## 2018-01-17 ENCOUNTER — Encounter: Payer: Self-pay | Admitting: Physical Therapy

## 2018-01-17 ENCOUNTER — Ambulatory Visit: Payer: Medicare HMO | Admitting: Physical Therapy

## 2018-01-17 DIAGNOSIS — Z9181 History of falling: Secondary | ICD-10-CM

## 2018-01-17 DIAGNOSIS — R2689 Other abnormalities of gait and mobility: Secondary | ICD-10-CM

## 2018-01-17 DIAGNOSIS — M6281 Muscle weakness (generalized): Secondary | ICD-10-CM | POA: Diagnosis present

## 2018-01-17 DIAGNOSIS — R293 Abnormal posture: Secondary | ICD-10-CM | POA: Diagnosis present

## 2018-01-17 DIAGNOSIS — R2681 Unsteadiness on feet: Secondary | ICD-10-CM

## 2018-01-17 NOTE — Telephone Encounter (Signed)
Do you want to refill? Has not been seen since 10/2017

## 2018-01-18 NOTE — Therapy (Signed)
[facility] [address] [place], [place], [address] Phone: [phone]   Fax:  [phone]  Physical Therapy Treatment & Recertification Evaluation  Patient Details  Name: Matthew Kline MRN: [id number] Date of Birth: 07/26/1960 Referring Provider (PT): Meridee Score, MD   Encounter Date: 01/17/2018  PT End of Session - 01/17/18 0930    Visit Number  32    Number of Visits  21    Date for PT Re-Evaluation  04/17/18    Authorization Type  [employer]    Authorization Time Period       PT Start Time  0845    PT Stop Time  0927    PT Time Calculation (min)  42 min    Equipment Utilized During Treatment  Gait belt    Activity Tolerance  Patient tolerated treatment well;No increased pain    Behavior During Therapy  WFL for tasks assessed/performed       Past Medical History:  Diagnosis Date  . Anemia, unspecified   . Anxiety   . Arthralgia 2010   polyarticular  . Arthritis    "back, knees" (01/10/2017)  . Cancer [facility])    "kidney area" (01/10/2017)  . CHF (congestive heart failure) ([place]) 07/25/2009   denies  . Chronic lower back pain   . Coronary artery disease   . Coughing    pt. reports that he has drainage from sinus infection  . Diabetic foot ulcer ([place])   . Diabetic neuropathy ([place])   . ESRD (end stage renal disease) on dialysis [facility])    started 12/2012; "MWF; [facility] "  (01/10/2017)  . GERD (gastroesophageal reflux disease)    hx "before I lost weight", no problem 9 years  . Hemodialysis access site with mature fistula ([place])   . Hemorrhoids, internal 10/2011   small  . High cholesterol   . History of blood transfusion    "related to the anemia"  . Hypertension   . Insomnia, unspecified   . Lacunar infarction ([place]) 2006   RUE/RLE, speech  . Long term (current) use of anticoagulants   . Myocardial infarction ([place]) 1995  . Orthostatic hypotension   . Osteomyelitis of foot, left,  acute ([place])   . Other chronic postoperative pain   . Pneumonia    "probably twice" (01/10/2017)  . Polymyalgia rheumatica ([place])   . Renal insufficiency   . Sleep apnea    "lost weight; no more problem" (01/10/2017)  . Stroke ([place]) 01/10/06   denies residual on 05/09/2014  . Type II diabetes mellitus ([place]) dx'd 1995  . Unspecified hereditary and idiopathic peripheral neuropathy    feet  . Unspecified osteomyelitis, site unspecified   . Unspecified vitamin D deficiency     Past Surgical History:  Procedure Laterality Date  . ABDOMINAL AORTOGRAM N/A 08/25/2016   Procedure: ABDOMINAL AORTOGRAM;  Surgeon: Wellington Hampshire, MD;  Location: [place];  Service: Cardiovascular;  Laterality: N/A;  . AMPUTATION  01/21/2012   Procedure: AMPUTATION RAY;  Surgeon: [name], MD;  Location: [facility];  Service: Orthopedics;  Laterality: Left;  Left Foot 4th Ray Amputation  . AMPUTATION Left 05/04/2013   Procedure: AMPUTATION DIGIT;  Surgeon: [name], MD;  Location: [facility];  Service: Orthopedics;  Laterality: Left;  Left Great Toe Amputation at [facility]  . AMPUTATION Left 01/14/2017   Procedure: AMPUTATION ABOVE LEFT KNEE;  Surgeon: [name], MD;  Location: [facility];  Service: Orthopedics;  Laterality: Left;  . ANTERIOR CERVICAL DECOMP/DISCECTOMY FUSION  02/2011  . BACK SURGERY    . BASCILIC VEIN TRANSPOSITION Left 10/19/2012   Procedure: BASCILIC VEIN TRANSPOSITION;  Surgeon: Serafina Mitchell, MD;  Location: [facility];  Service: Vascular;  Laterality: Left;  . CARDIAC CATHETERIZATION     "before bypass"  . CORONARY ARTERY BYPASS GRAFT     x 5 with lima at [place] WITH ANTIBIOTIC SPACERS Left 08/07/2014   Procedure: Replace Left Total Knee Arthroplasty,  Place Antibiotic Spacer;  Surgeon: [name], MD;  Location: [facility];  Service: Orthopedics;  Laterality: Left;  . I&D EXTREMITY Left 05/09/2014   Procedure: Irrigation and Debridement Left Knee and  Closure of Total Knee Arthroplasty Incision;  Surgeon: [name], MD;  Location: [facility];  Service: Orthopedics;  Laterality: Left;  . I&D KNEE WITH POLY EXCHANGE Left 05/31/2014   Procedure: IRRIGATION AND DEBRIDEMENT LEFT KNEE, PLACE ANTIBIOTIC BEADS,  POLY EXCHANGE;  Surgeon: [name], MD;  Location: [facility];  Service: Orthopedics;  Laterality: Left;  . IRRIGATION AND DEBRIDEMENT KNEE Left 01/12/2017   Procedure: IRRIGATION AND DEBRIDEMENT LEFT KNEE;  Surgeon: [name], MD;  Location: [facility];  Service: Orthopedics;  Laterality: Left;  . JOINT REPLACEMENT    . KNEE ARTHROSCOPY Left 08-25-2012  . LOWER EXTREMITY ANGIOGRAPHY Left 08/25/2016   Procedure: Lower Extremity Angiography;  Surgeon: Wellington Hampshire, MD;  Location: [facility];  Service: Cardiovascular;  Laterality: Left;  . PERIPHERAL VASCULAR BALLOON ANGIOPLASTY Left 08/25/2016   Procedure: PERIPHERAL VASCULAR BALLOON ANGIOPLASTY;  Surgeon: Wellington Hampshire, MD;  Location: [facility];  Service: Cardiovascular;  Laterality: Left;  lt peroneal and ant tibial arteries cutting balloon  . REFRACTIVE SURGERY Bilateral   . TOE AMPUTATION Bilateral    "I've lost 7 toes over the last 7 years" (05/09/2014)  . TOE SURGERY Left April 2015   Big toe removed on left foot.  . TONSILLECTOMY    . TOTAL KNEE ARTHROPLASTY Left 04/10/2014   Procedure: TOTAL KNEE ARTHROPLASTY;  Surgeon: [name], MD;  Location: [facility];  Service: Orthopedics;  Laterality: Left;  . TOTAL KNEE REVISION Left 10/25/2014   Procedure: LEFT TOTAL KNEE REVISION;  Surgeon: [name], MD;  Location: [facility];  Service: Orthopedics;  Laterality: Left;  . TOTAL KNEE REVISION Left 11/26/2015   Procedure: Removal Left Total Knee Arthroplasty, Hinged Total Knee Arthroplasty;  Surgeon: [name], MD;  Location: [facility];  Service: Orthopedics;  Laterality: Left;  . UVULOPALATOPHARYNGOPLASTY, TONSILLECTOMY AND SEPTOPLASTY  ~ 1989  . WOUND DEBRIDEMENT Left  05/09/2014   Dehiscence Left Total Knee Arthroplasty Incision    There were no vitals filed for this visit.  Subjective Assessment - 01/17/18 0845    Subjective  Colonscopy resulted in Krohn's disease. He is working on new diet in combo with dialysis diet. He lost total of 17#. No falls. He still in loaner prosthetic knee. No news on when his knee will be returned.     Pertinent History  Left TFA, ESRD with dialysis M,W,F, CHF, CAD s/p CABG 05/2016, PAD, CVA, DM2, neuropathy, arthritis, right toe amputations    Limitations  Lifting;Standing;Walking;House hold activities    Patient Stated Goals  He wants to use prosthesis to walk without assistance, garden, cook / work in [employer]     Currently in Pain?  No/denies    Pain Onset  More than  a month ago         [facility] PT Assessment - 01/17/18 0845      Berg Balance Test   Sit to Stand  Able to stand  independently using hands    Standing Unsupported  Able to stand safely 2 minutes    Sitting with Back Unsupported but Feet Supported on Floor or Stool  Able to sit safely and securely 2 minutes    Stand to Sit  Sits safely with minimal use of hands    Transfers  Able to transfer safely, definite need of hands    Standing Unsupported with Eyes Closed  Able to stand 10 seconds safely    Standing Ubsupported with Feet Together  Able to place feet together independently and stand for 1 minute with supervision    From Standing, Reach Forward with Outstretched Arm  Can reach confidently >25 cm (10")    From Standing Position, Pick up Object from [place] to pick up shoe safely and easily    From Standing Position, Turn to Look Behind Over each Shoulder  Looks behind from both sides and weight shifts well    Turn 360 Degrees  Needs assistance while turning    Standing Unsupported, Alternately Place Feet on Step/Stool  Able to complete >2 steps/needs minimal assist    Standing Unsupported, One Foot in Front  Able to take small step independently and  hold 30 seconds    Standing on One Leg  Tries to lift leg/unable to hold 3 seconds but remains standing independently    Total Score  41    Berg comment:  Initial Berg 09/20/17 11/56 & on 09/23/17 37/56      Dynamic Gait Index   Level Surface  Moderate Impairment    Change in Gait Speed  Severe Impairment    Gait with Horizontal Head Turns  Moderate Impairment    Gait with Vertical Head Turns  Moderate Impairment    Gait and Pivot Turn  Moderate Impairment    Step Over Obstacle  Severe Impairment    Step Around Obstacles  Moderate Impairment    Steps  Moderate Impairment    Total Score  6    DGI comment:  today 6/24 with cane quad tip; initial DGI 1/24 with RW                   [facility] Adult PT Treatment/Exercise - 01/17/18 0845      Transfers   Transfers  Sit to Stand;Stand to Sit    Sit to Stand  5: Supervision;With upper extremity assist;With armrests;From chair/3-in-1   now can stabilize without UE support   Stand to Sit  5: Supervision;With upper extremity assist;With armrests;To chair/3-in-1      Ambulation/Gait   Ambulation/Gait  Yes    Ambulation/Gait Assistance  4: Min assist;5: Supervision;3: Mod assist   cane minA for occassional balance loss, MinA to ModA no AD   Ambulation Distance (Feet)  300 Feet   300' X 2 cane, 100' no AD   Assistive device  Prosthesis;Straight cane;None    Gait Pattern  Step-through pattern;Decreased arm swing - left;Decreased step length - right;Decreased stance time - left;Decreased hip/knee flexion - left;Decreased weight shift to left;Left hip hike;Left circumduction;Antalgic;Lateral hip instability;Trunk rotated posteriorly on left;Abducted - left    Ambulation Surface  Indoor;Level    Gait velocity  2.04 ft/sec    Stairs  Yes    Stairs Assistance  4: Min assist;5: Supervision  MinA using prosthetic knee to descend, Sup modifed technique   Stairs Assistance Details (indicate cue type and reason)  Manual, verbal & demo cues on  technique to engage prosthetic knee    Stair Management Technique  Two rails;Step to pattern;Forwards;Alternating pattern    Number of Stairs  4   multiple reps   Ramp  4: Min assist   cane & prosthesis   Ramp Details (indicate cue type and reason)  verbal cues on technique, posture & wt shift    Curb  4: Min assist;3: Mod assist   cane & prosthesis, ModA 1st attempt & MinA 2nd-3rd   Curb Details (indicate cue type and reason)  tactile, demo & verbal cues on technique, foot position & wt shift step through      Prosthetics   Prosthetic Care Comments   17# weight loss since prosthetic socket fitted so too large. Now that he has diagnosis of Krohn's with new diet, he should be able to put some of weight back on. PT recommended using socks & pads from prosthetist until his weight stabilizes.      Current prosthetic wear tolerance (days/week)   daily    Current prosthetic wear tolerance (#hours/day)   reports all awake hours with a break somedays after dialysis for napping    Edema  fluctuating due to dialysis    Residual limb condition   no issues per pt report    Education Provided  Other (comment);Correct ply sock adjustment   see prosthetic care comments              PT Short Term Goals - 01/18/18 0707      PT SHORT TERM GOAL #1   Title  Patient ambulates 300' cane & prosthesis scanning environment with supervision. (All STGs Target Date: 02/16/2018)    Time  1    Period  Months    Status  New    Target Date  02/16/18      PT SHORT TERM GOAL #2   Title  Patient negotiates ramps & curbs with cane & prosthesis with minA/ guard.    Baseline       Time  1    Period  Months    Status  Revised    Target Date  02/16/18      PT SHORT TERM GOAL #3   Title  Patient descends stairs with 2 rails & prosthesis alternate pattern with supervision & 1 rail/cane with minA.     Baseline       Time  1    Period  Months    Status  New    Target Date  02/16/18      PT SHORT TERM GOAL  #4   Title  Patient lifts 10# crate from floor engaging prosthesis with supervision.     Baseline       Time  1    Period  Months    Status  New    Target Date  02/16/18      PT SHORT TERM GOAL #5   Title  Patient ambulates 100' with prosthesis only carrying plate with minimal guard.    Baseline       Time  1    Period  Months    Status  New    Target Date  02/16/18        PT Long Term Goals - 01/17/18 1800      PT LONG TERM GOAL #1   Title  Patient verbalizes proper prosthetic care including problem solving issues and when to contact prosthetist. (All updated LTGs Target Date: 12/21/2017)    Baseline  Partially MET 12/20/2017 Patient verbalizes proper care generally but needs PT direction due to significant changes in limb volume with weight loss >10#, typical shrinkage with initial socket & fluid changes with dialysis.     Time  3    Period  Months    Status  Partially Met      PT LONG TERM GOAL #2   Title  Patient tolerates prosthesis wear >90% of awake hours without skin or limb pain issues to enable function throughout his day.     Baseline  MET 12/20/2017     Time  3    Period  Months    Status  Achieved      PT LONG TERM GOAL #3   Title  Berg Balance with device >45/56 to indicate lower fall risk.     Baseline  MET 12/20/2017 Berg Balance improved to 48/56    Time  3    Period  Months    Status  Achieved      PT LONG TERM GOAL #4   Title  Patient ambulates 1000' outdoors including grass, ramps & curbs with cane or less and prosthesis modified independent.     Baseline  NOT MET 12/20/2017 Patient ambulates 500' outdoors with forearm crutches. With cane he requires minimal assist and hand hold assist after 100'.     Time  3    Period  Months    Status  Not Met      PT LONG TERM GOAL #5   Title  Patient descends stairs with alternate pattern with single rail & cane and negotiates stairs without rails with cane step-to pattern modified independent.     Baseline   NOT MET 01/17/2018 Patient requires MinA & 2 rails to use prosthetic knee hydraulics to control descent.     Time  3    Period  Months    Status  Not Met      PT LONG TERM GOAL #6   Title  Patient ambulates around furniture with prosthesis only carrying light weight household items modified independent.     Baseline  NOT MET 01/17/2018 Patient ambulates without assistive device with prosthesis only with Moderate to Minimal assist.     Time  3    Period  Months    Status  Not Met        PT Long Term Goals - 01/17/18 1900      PT LONG TERM GOAL #1   Title  Patient verbalizes proper prosthetic care including problem solving issues like socket fit with weight changes, wearing >90% of awake hours without skin issues and when to contact prosthetist. (All updated LTGs Target Date: 04/14/2018)    Time  3    Period  Months    Status  Revised    Target Date  04/14/18      PT LONG TERM GOAL #2   Title  Dynamic Gait Index with cane or less and prosthesis >/= 14/24 to indicate lower fall risk.     Time  3    Period  Months    Status  New    Target Date  04/14/18      PT LONG TERM GOAL #3   Title  Berg Balance with device >/= 52/56 to indicate lower fall risk.     Time  3  Period  Months    Status  Revised    Target Date  04/14/18      PT LONG TERM GOAL #4   Title  Patient ambulates 1000' outdoors including grass with cane or less and prosthesis modified independent.     Time  3    Period  Months    Status  On-going    Target Date  04/14/18      PT LONG TERM GOAL #5   Title  patient negotiates stairs with single rail, ramps & curbs with cane or less and prosthesis modified independent.     Time  3    Period  Months    Status  New    Target Date  04/14/18      PT LONG TERM GOAL #6   Title  Patient ambulates around furniture with prosthesis only carrying light weight household items modified independent.     Time  3    Period  Months    Status  On-going    Target Date  04/14/18             Plan - 01/17/18 1800    Clinical Impression Statement  Patient met or partially met 3 of his 6 LTGs. He is improving prosthetic gait with cane for community & no device for household activities. His progress has been slowed by medical issues over last few months. He was recently diagnosised with Krohn's disease which should enable his medical status to stabilize and would allow better response to PT. He would benefit from additional skilled PT services to meet his potential level of function with his Transfemoral prosthesis.     Rehab Potential  Good    PT Frequency  2x / week    PT Duration  Other (comment)   13 weeks (90 days) / 3 months   PT Treatment/Interventions  ADLs/Self Care Home Management;DME Instruction;Gait training;Stair training;Functional mobility training;Therapeutic activities;Therapeutic exercise;Balance training;Neuromuscular re-education;Canalith Repostioning;Patient/family education;Prosthetic Training;Vestibular    PT Next Visit Plan  community based prosthetic gait activities with cane and household without device, balance activities    Consulted and Agree with Plan of Care  Patient       Patient will benefit from skilled therapeutic intervention in order to improve the following deficits and impairments:  Decreased activity tolerance, Abnormal gait, Decreased balance, Decreased endurance, Decreased knowledge of use of DME, Decreased mobility, Decreased strength, Prosthetic Dependency, Postural dysfunction, Dizziness  Visit Diagnosis: Other abnormalities of gait and mobility  Muscle weakness (generalized)  Unsteadiness on feet  Abnormal posture  History of falling     Problem List Patient Active Problem List   Diagnosis Date Noted  . Altered mental status   . Cerebral thrombosis with cerebral infarction 02/06/2017  . Cerebral embolism with cerebral infarction 02/06/2017  . Above knee amputation status, left   . Pressure injury of skin  01/30/2017  . Acute lower UTI 01/29/2017  . Acute metabolic encephalopathy 34/19/3790  . UTI (urinary tract infection) 01/29/2017  . Quadriceps muscle rupture, left, initial encounter   . Fall 01/09/2017  . Gait disturbance 01/09/2017  . Staphylococcus aureus infection 01/09/2017  . Infection of prosthetic left knee joint ([place]) 01/09/2017  . Fever, unknown origin 11/09/2016  . Critical lower limb ischemia 08/25/2016  . Ulcer of left midfoot with fat layer exposed ([place]) 08/13/2016  . Diabetic ulcer of left midfoot associated with type 2 diabetes mellitus, with fat layer exposed ([place]) 07/25/2016  . Peripheral neuropathy 07/22/2016  . Tobacco abuse  07/22/2016  . CAD in native artery 05/18/2016  . CAD, multiple vessel 05/11/2016  . Positive cardiac stress test 05/11/2016  . Abnormal stress test 04/30/2016  . Pre-transplant evaluation for kidney transplant 04/30/2016  . S/P revision of total knee 11/26/2015  . Pain in the chest   . Acute on chronic diastolic heart failure ([place]) 07/05/2015  . Volume overload 07/04/2015  . Shortness of breath 07/04/2015  . Hypoxemia 07/04/2015  . Elevated troponin   . End-stage renal disease on hemodialysis ([place])   . Hypervolemia   . 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 Pyogenic bacterial arthritis of knee, left ([place]) 08/07/2014  . Tachycardia 07/24/2014  . Acute upper respiratory infection 07/24/2014  . ESRD on dialysis ([place]) 07/14/2014  . Type II diabetes mellitus ([place]) 07/14/2014  . Anemia in chronic kidney disease 07/14/2014  . Congestive heart failure (CHF) ([place]) 07/13/2014  . Surgical wound dehiscence 05/09/2014  . Dehiscence of closure of skin 05/09/2014  . Total knee replacement status 04/10/2014  . Diabetes mellitus with renal manifestations, controlled ([place]) 10/24/2013  . Hypertensive renal disease 06/27/2013  . DM type 2 causing vascular disease ([place]) 06/27/2013  . Erectile dysfunction 06/27/2013  . Depression  06/27/2013  . Claudication of left lower extremity ([place]) 12/19/2012  . Essential hypertension, benign 12/19/2012  . Sinusitis, acute maxillary 11/22/2012  . Otitis, externa, infective 11/14/2012  . Leg edema, left 11/14/2012  . End stage renal disease ([place]) 10/02/2012  . Controlled type 2 DM with proteinuria or microalbuminuria 09/19/2012  . GERD (gastroesophageal reflux disease) 09/19/2012  . Leukocytosis 09/19/2012  . Lacunar infarction ([place]) 08/17/2012  . Polymyalgia rheumatica ([place]) 08/17/2012  . 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 Essential hypertension 05/10/2012  . Vitamin D deficiency 05/10/2012  . Diabetes mellitus due to underlying condition ([place]) 05/10/2012  . Hyperlipidemia LDL goal <100 05/10/2012  . Anemia of chronic disease 05/10/2012  . Screening for prostate cancer 05/10/2012  . Chronic kidney disease (CKD), stage IV (severe) ([place]) 05/10/2012  . Peripheral autonomic neuropathy due to DM ([place]) 05/10/2012  . Callus of foot 05/10/2012  . Urgency of urination 05/10/2012  . Hyperkalemia 05/10/2012  . Candidiasis of the esophagus 10/12/2011  . Internal hemorrhoids without mention of complication 10/93/2355  . Pre-syncope 07/25/2009  . DJD (degenerative joint disease) of cervical spine 02/17/2009    Anden Bartolo PT, DPT 01/18/2018, 7:11 AM  [facility] [address] [place], [place], [address] Phone: [phone]   Fax:  [phone]  Name: Matthew Kline MRN: [id number] Date of Birth: 1960/08/22

## 2018-01-19 ENCOUNTER — Ambulatory Visit: Payer: Medicare HMO | Admitting: Physical Therapy

## 2018-01-24 ENCOUNTER — Ambulatory Visit: Payer: Medicare HMO | Admitting: Physical Therapy

## 2018-01-24 ENCOUNTER — Encounter: Payer: Self-pay | Admitting: Physical Therapy

## 2018-01-24 DIAGNOSIS — R2689 Other abnormalities of gait and mobility: Secondary | ICD-10-CM

## 2018-01-24 DIAGNOSIS — R2681 Unsteadiness on feet: Secondary | ICD-10-CM

## 2018-01-24 DIAGNOSIS — R293 Abnormal posture: Secondary | ICD-10-CM

## 2018-01-24 DIAGNOSIS — M6281 Muscle weakness (generalized): Secondary | ICD-10-CM

## 2018-01-24 DIAGNOSIS — Z9181 History of falling: Secondary | ICD-10-CM

## 2018-01-24 NOTE — Therapy (Signed)
[facility] [address] [place], [place], [address] Phone: [phone]   Fax:  [phone]  Physical Therapy Treatment  Patient Details  Name: Matthew Kline MRN: [id number] Date of Birth: May 19, 1960 Referring Provider (PT): Meridee Score, MD   Encounter Date: 01/24/2018  PT End of Session - 01/24/18 0930    Visit Number  33    Number of Visits  94    Date for PT Re-Evaluation  04/17/18    Authorization Type  [employer]    Authorization Time Period       PT Start Time  [phone]    PT Stop Time  0930    PT Time Calculation (min)  47 min    Equipment Utilized During Treatment  Gait belt    Activity Tolerance  Patient tolerated treatment well;No increased pain    Behavior During Therapy  WFL for tasks assessed/performed       Past Medical History:  Diagnosis Date  . Anemia, unspecified   . Anxiety   . Arthralgia 2010   polyarticular  . Arthritis    "back, knees" (01/10/2017)  . Cancer [facility])    "kidney area" (01/10/2017)  . CHF (congestive heart failure) ([place]) 07/25/2009   denies  . Chronic lower back pain   . Coronary artery disease   . Coughing    pt. reports that he has drainage from sinus infection  . Diabetic foot ulcer ([place])   . Diabetic neuropathy ([place])   . ESRD (end stage renal disease) on dialysis [facility])    started 12/2012; "MWF; [facility] "  (01/10/2017)  . GERD (gastroesophageal reflux disease)    hx "before I lost weight", no problem 9 years  . Hemodialysis access site with mature fistula ([place])   . Hemorrhoids, internal 10/2011   small  . High cholesterol   . History of blood transfusion    "related to the anemia"  . Hypertension   . Insomnia, unspecified   . Lacunar infarction ([place]) 2006   RUE/RLE, speech  . Long term (current) use of anticoagulants   . Myocardial infarction ([place]) 1995  . Orthostatic hypotension   . Osteomyelitis of foot, left, acute ([place])   . Other chronic  postoperative pain   . Pneumonia    "probably twice" (01/10/2017)  . Polymyalgia rheumatica ([place])   . Renal insufficiency   . Sleep apnea    "lost weight; no more problem" (01/10/2017)  . Stroke ([place]) 01/10/06   denies residual on 05/09/2014  . Type II diabetes mellitus ([place]) dx'd 1995  . Unspecified hereditary and idiopathic peripheral neuropathy    feet  . Unspecified osteomyelitis, site unspecified   . Unspecified vitamin D deficiency     Past Surgical History:  Procedure Laterality Date  . ABDOMINAL AORTOGRAM N/A 08/25/2016   Procedure: ABDOMINAL AORTOGRAM;  Surgeon: Wellington Hampshire, MD;  Location: [facility];  Service: Cardiovascular;  Laterality: N/A;  . AMPUTATION  01/21/2012   Procedure: AMPUTATION RAY;  Surgeon: [name], MD;  Location: [facility];  Service: Orthopedics;  Laterality: Left;  Left Foot 4th Ray Amputation  . AMPUTATION Left 05/04/2013   Procedure: AMPUTATION DIGIT;  Surgeon: [name], MD;  Location: [facility];  Service: Orthopedics;  Laterality: Left;  Left Great Toe Amputation at [facility]  . AMPUTATION Left 01/14/2017   Procedure: AMPUTATION ABOVE LEFT KNEE;  Surgeon: [name], MD;  Location: [facility];  Service: Orthopedics;  Laterality: Left;  .  ANTERIOR CERVICAL DECOMP/DISCECTOMY FUSION  02/2011  . BACK SURGERY    . BASCILIC VEIN TRANSPOSITION Left 10/19/2012   Procedure: BASCILIC VEIN TRANSPOSITION;  Surgeon: Serafina Mitchell, MD;  Location: [facility];  Service: Vascular;  Laterality: Left;  . CARDIAC CATHETERIZATION     "before bypass"  . CORONARY ARTERY BYPASS GRAFT     x 5 with lima at [facility] WITH ANTIBIOTIC SPACERS Left 08/07/2014   Procedure: Replace Left Total Knee Arthroplasty,  Place Antibiotic Spacer;  Surgeon: [name], MD;  Location: [facility];  Service: Orthopedics;  Laterality: Left;  . I&D EXTREMITY Left 05/09/2014   Procedure: Irrigation and Debridement Left Knee and Closure of Total Knee  Arthroplasty Incision;  Surgeon: [name], MD;  Location: [facility];  Service: Orthopedics;  Laterality: Left;  . I&D KNEE WITH POLY EXCHANGE Left 05/31/2014   Procedure: IRRIGATION AND DEBRIDEMENT LEFT KNEE, PLACE ANTIBIOTIC BEADS,  POLY EXCHANGE;  Surgeon: [name], MD;  Location: [facility];  Service: Orthopedics;  Laterality: Left;  . IRRIGATION AND DEBRIDEMENT KNEE Left 01/12/2017   Procedure: IRRIGATION AND DEBRIDEMENT LEFT KNEE;  Surgeon: [name], MD;  Location: [facility];  Service: Orthopedics;  Laterality: Left;  . JOINT REPLACEMENT    . KNEE ARTHROSCOPY Left 08-25-2012  . LOWER EXTREMITY ANGIOGRAPHY Left 08/25/2016   Procedure: Lower Extremity Angiography;  Surgeon: Wellington Hampshire, MD;  Location: [facility];  Service: Cardiovascular;  Laterality: Left;  . PERIPHERAL VASCULAR BALLOON ANGIOPLASTY Left 08/25/2016   Procedure: PERIPHERAL VASCULAR BALLOON ANGIOPLASTY;  Surgeon: Wellington Hampshire, MD;  Location: [facility];  Service: Cardiovascular;  Laterality: Left;  lt peroneal and ant tibial arteries cutting balloon  . REFRACTIVE SURGERY Bilateral   . TOE AMPUTATION Bilateral    "I've lost 7 toes over the last 7 years" (05/09/2014)  . TOE SURGERY Left April 2015   Big toe removed on left foot.  . TONSILLECTOMY    . TOTAL KNEE ARTHROPLASTY Left 04/10/2014   Procedure: TOTAL KNEE ARTHROPLASTY;  Surgeon: [name], MD;  Location: [facility];  Service: Orthopedics;  Laterality: Left;  . TOTAL KNEE REVISION Left 10/25/2014   Procedure: LEFT TOTAL KNEE REVISION;  Surgeon: [name], MD;  Location: [facility];  Service: Orthopedics;  Laterality: Left;  . TOTAL KNEE REVISION Left 11/26/2015   Procedure: Removal Left Total Knee Arthroplasty, Hinged Total Knee Arthroplasty;  Surgeon: [name], MD;  Location: [facility];  Service: Orthopedics;  Laterality: Left;  . UVULOPALATOPHARYNGOPLASTY, TONSILLECTOMY AND SEPTOPLASTY  ~ 1989  . WOUND DEBRIDEMENT Left 05/09/2014   Dehiscence  Left Total Knee Arthroplasty Incision    There were no vitals filed for this visit.  Subjective Assessment - 01/24/18 0843    Subjective  He was sick last Thursday due to stomach issues. He is working with dietican at dialysis to coordinate 3 diets. They lowered dry by 1.5kg and it is better.     Pertinent History  Left TFA, ESRD with dialysis M,W,F, CHF, CAD s/p CABG 05/2016, PAD, CVA, DM2, neuropathy, arthritis, right toe amputations    Limitations  Lifting;Standing;Walking;House hold activities    Patient Stated Goals  He wants to use prosthesis to walk without assistance, garden, cook / work in [employer]     Currently in Pain?  No/denies    Pain Onset  More than a month ago  [facility] Adult PT Treatment/Exercise - 01/24/18 0845      Transfers   Transfers  Sit to Stand;Stand to Sit    Sit to Stand  5: Supervision;With upper extremity assist;With armrests;From chair/3-in-1   now can stabilize without UE support   Stand to Sit  5: Supervision;With upper extremity assist;With armrests;To chair/3-in-1      Ambulation/Gait   Ambulation/Gait  Yes    Ambulation/Gait Assistance  4: Min assist;5: Supervision;4: Min guard    Ambulation/Gait Assistance Details  tactile & verbal cues on upright posture, step width and wt shift over prosthesis    Ambulation Distance (Feet)  300 Feet   300' X 2 cane   Assistive device  Prosthesis;Straight cane    Gait Pattern  Step-through pattern;Decreased arm swing - left;Decreased step length - right;Decreased stance time - left;Decreased hip/knee flexion - left;Decreased weight shift to left;Left hip hike;Left circumduction;Antalgic;Lateral hip instability;Trunk rotated posteriorly on left;Abducted - left    Ambulation Surface  Indoor;Level    Stairs  Yes    Stairs Assistance  5: Supervision   using prosthetic knee to descend   Stairs Assistance Details (indicate cue type and reason)  demo & verbal cues on engaging prosthetic  knee    Stair Management Technique  Two rails;Step to pattern;Forwards;Alternating pattern    Number of Stairs  4   6 reps   Ramp  4: Min assist   cane & prosthesis   Ramp Details (indicate cue type and reason)  tactile & verbal cues on prosthetic knee flexion ascending and step through pattern to flex knee in terminal stance    Curb  4: Min assist   cane & prosthesis   Curb Details (indicate cue type and reason)  demo, tactile & verbal cues on technique with foot position, step through and prosthetic control      High Level Balance   High Level Balance Activities  Head turns;Weight-shifting turns;Negotitating around obstacles;Negotiating over obstacles   cane & prosthesis   High Level Balance Comments  demo, tactile & verbal cues on engaging prosthesis      Self-Care   Lifting  Pt lifted 10# crate with tactile & verbal cues on technique      Prosthetics   Prosthetic Care Comments   PT recommended when having socket revision, he should be casted on Monday morning when his limb would be largest size with dialysis. He verbalized understanding.     Current prosthetic wear tolerance (days/week)   daily    Current prosthetic wear tolerance (#hours/day)   reports all awake hours with a break somedays after dialysis for napping    Edema  fluctuating due to dialysis    Residual limb condition   no issues per pt report    Education Provided  Other (comment);Correct ply sock adjustment   see prosthetic care comments   Person(s) Educated  Patient    Education Method  Explanation;Verbal cues    Education Method  Verbalized understanding;Verbal cues required               PT Short Term Goals - 01/18/18 0707      PT SHORT TERM GOAL #1   Title  Patient ambulates 300' cane & prosthesis scanning environment with supervision. (All STGs Target Date: 02/16/2018)    Time  1    Period  Months    Status  New    Target Date  02/16/18      PT SHORT TERM GOAL #2   Title  Patient negotiates  ramps & curbs with cane & prosthesis with minA/ guard.    Baseline       Time  1    Period  Months    Status  Revised    Target Date  02/16/18      PT SHORT TERM GOAL #3   Title  Patient descends stairs with 2 rails & prosthesis alternate pattern with supervision & 1 rail/cane with minA.     Baseline       Time  1    Period  Months    Status  New    Target Date  02/16/18      PT SHORT TERM GOAL #4   Title  Patient lifts 10# crate from floor engaging prosthesis with supervision.     Baseline       Time  1    Period  Months    Status  New    Target Date  02/16/18      PT SHORT TERM GOAL #5   Title  Patient ambulates 100' with prosthesis only carrying plate with minimal guard.    Baseline       Time  1    Period  Months    Status  New    Target Date  02/16/18        PT Long Term Goals - 01/17/18 1900      PT LONG TERM GOAL #1   Title  Patient verbalizes proper prosthetic care including problem solving issues like socket fit with weight changes, wearing >90% of awake hours without skin issues and when to contact prosthetist. (All updated LTGs Target Date: 04/14/2018)    Time  3    Period  Months    Status  Revised    Target Date  04/14/18      PT LONG TERM GOAL #2   Title  Dynamic Gait Index with cane or less and prosthesis >/= 14/24 to indicate lower fall risk.     Time  3    Period  Months    Status  New    Target Date  04/14/18      PT LONG TERM GOAL #3   Title  Berg Balance with device >/= 52/56 to indicate lower fall risk.     Time  3    Period  Months    Status  Revised    Target Date  04/14/18      PT LONG TERM GOAL #4   Title  Patient ambulates 1000' outdoors including grass with cane or less and prosthesis modified independent.     Time  3    Period  Months    Status  On-going    Target Date  04/14/18      PT LONG TERM GOAL #5   Title  patient negotiates stairs with single rail, ramps & curbs with cane or less and prosthesis modified independent.      Time  3    Period  Months    Status  New    Target Date  04/14/18      PT LONG TERM GOAL #6   Title  Patient ambulates around furniture with prosthesis only carrying light weight household items modified independent.     Time  3    Period  Months    Status  On-going    Target Date  04/14/18            Plan - 01/24/18 2128  Clinical Impression Statement  Today's skilled session focused on prosthetic gait with cane including scanning, negotiating around & over obstacles and changing directions. Pt is on target to meet STGs.     Rehab Potential  Good    PT Frequency  2x / week    PT Duration  Other (comment)   13 weeks (90 days) / 3 months   PT Treatment/Interventions  ADLs/Self Care Home Management;DME Instruction;Gait training;Stair training;Functional mobility training;Therapeutic activities;Therapeutic exercise;Balance training;Neuromuscular re-education;Canalith Repostioning;Patient/family education;Prosthetic Training;Vestibular    PT Next Visit Plan  community based prosthetic gait activities with cane and household without device, balance activities    Consulted and Agree with Plan of Care  Patient       Patient will benefit from skilled therapeutic intervention in order to improve the following deficits and impairments:  Decreased activity tolerance, Abnormal gait, Decreased balance, Decreased endurance, Decreased knowledge of use of DME, Decreased mobility, Decreased strength, Prosthetic Dependency, Postural dysfunction, Dizziness  Visit Diagnosis: Other abnormalities of gait and mobility  Muscle weakness (generalized)  Unsteadiness on feet  Abnormal posture  History of falling     Problem List Patient Active Problem List   Diagnosis Date Noted  . Altered mental status   . Cerebral thrombosis with cerebral infarction 02/06/2017  . Cerebral embolism with cerebral infarction 02/06/2017  . Above knee amputation status, left   . Pressure injury of skin  01/30/2017  . Acute lower UTI 01/29/2017  . Acute metabolic encephalopathy 63/14/9702  . UTI (urinary tract infection) 01/29/2017  . Quadriceps muscle rupture, left, initial encounter   . Fall 01/09/2017  . Gait disturbance 01/09/2017  . Staphylococcus aureus infection 01/09/2017  . Infection of prosthetic left knee joint ([place]) 01/09/2017  . Fever, unknown origin 11/09/2016  . Critical lower limb ischemia 08/25/2016  . Ulcer of left midfoot with fat layer exposed ([place]) 08/13/2016  . Diabetic ulcer of left midfoot associated with type 2 diabetes mellitus, with fat layer exposed ([place]) 07/25/2016  . Peripheral neuropathy 07/22/2016  . Tobacco abuse 07/22/2016  . CAD in native artery 05/18/2016  . CAD, multiple vessel 05/11/2016  . Positive cardiac stress test 05/11/2016  . Abnormal stress test 04/30/2016  . Pre-transplant evaluation for kidney transplant 04/30/2016  . S/P revision of total knee 11/26/2015  . Pain in the chest   . Acute on chronic diastolic heart failure ([place]) 07/05/2015  . Volume overload 07/04/2015  . Shortness of breath 07/04/2015  . Hypoxemia 07/04/2015  . Elevated troponin   . End-stage renal disease on hemodialysis ([place])   . Hypervolemia   . 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 Pyogenic bacterial arthritis of knee, left ([place]) 08/07/2014  . Tachycardia 07/24/2014  . Acute upper respiratory infection 07/24/2014  . ESRD on dialysis ([place]) 07/14/2014  . Type II diabetes mellitus ([place]) 07/14/2014  . Anemia in chronic kidney disease 07/14/2014  . Congestive heart failure (CHF) ([place]) 07/13/2014  . Surgical wound dehiscence 05/09/2014  . Dehiscence of closure of skin 05/09/2014  . Total knee replacement status 04/10/2014  . Diabetes mellitus with renal manifestations, controlled ([place]) 10/24/2013  . Hypertensive renal disease 06/27/2013  . DM type 2 causing vascular disease ([place]) 06/27/2013  . Erectile dysfunction 06/27/2013  . Depression  06/27/2013  . Claudication of left lower extremity ([place]) 12/19/2012  . Essential hypertension, benign 12/19/2012  . Sinusitis, acute maxillary 11/22/2012  . Otitis, externa, infective 11/14/2012  . Leg edema, left 11/14/2012  . End stage renal disease ([place]) 10/02/2012  .  Controlled type 2 DM with proteinuria or microalbuminuria 09/19/2012  . GERD (gastroesophageal reflux disease) 09/19/2012  . Leukocytosis 09/19/2012  . Lacunar infarction ([place]) 08/17/2012  . Polymyalgia rheumatica ([place]) 08/17/2012  . 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 Essential hypertension 05/10/2012  . Vitamin D deficiency 05/10/2012  . Diabetes mellitus due to underlying condition ([place] [facility]) 05/10/2012  . Hyperlipidemia LDL goal <100 05/10/2012  . Anemia of chronic disease 05/10/2012  . Screening for prostate cancer 05/10/2012  . Chronic kidney disease (CKD), stage IV (severe) ([place]) 05/10/2012  . Peripheral autonomic neuropathy due to DM ([place]) 05/10/2012  . Callus of foot 05/10/2012  . Urgency of urination 05/10/2012  . Hyperkalemia 05/10/2012  . Candidiasis of the esophagus 10/12/2011  . Internal hemorrhoids without mention of complication 45/85/9292  . Pre-syncope 07/25/2009  . DJD (degenerative joint disease) of cervical spine 02/17/2009    Emeka Lindner PT, DPT 01/24/2018, 9:30 PM  [facility] [address] [place], [place], [address] Phone: [phone]   Fax:  [phone]  Name: Markice Torbert MRN: [id number] Date of Birth: 04-May-1960

## 2018-01-26 ENCOUNTER — Ambulatory Visit: Payer: Medicare HMO | Admitting: Physical Therapy

## 2018-01-31 ENCOUNTER — Ambulatory Visit: Payer: Medicare HMO | Admitting: Physical Therapy

## 2018-02-02 ENCOUNTER — Ambulatory Visit: Payer: Medicare HMO | Admitting: Physical Therapy

## 2018-02-02 ENCOUNTER — Encounter: Payer: Self-pay | Admitting: Physical Therapy

## 2018-02-02 DIAGNOSIS — Z9181 History of falling: Secondary | ICD-10-CM

## 2018-02-02 DIAGNOSIS — R293 Abnormal posture: Secondary | ICD-10-CM

## 2018-02-02 DIAGNOSIS — R2681 Unsteadiness on feet: Secondary | ICD-10-CM

## 2018-02-02 DIAGNOSIS — R2689 Other abnormalities of gait and mobility: Secondary | ICD-10-CM | POA: Diagnosis not present

## 2018-02-02 DIAGNOSIS — M6281 Muscle weakness (generalized): Secondary | ICD-10-CM

## 2018-02-02 NOTE — Therapy (Signed)
[facility] [address] [place], [place], [address] Phone: [phone]   Fax:  [phone]  Physical Therapy Treatment  Patient Details  Name: Matthew Kline MRN: [id number] Date of Birth: 06-25-1960 Referring Provider (PT): Meridee Score, MD   Encounter Date: 02/02/2018  PT End of Session - 02/02/18 1238    Visit Number  34    Number of Visits  67    Date for PT Re-Evaluation  04/17/18    Authorization Type  [employer]    Authorization Time Period       PT Start Time  [phone]    PT Stop Time  0932    PT Time Calculation (min)  39 min    Equipment Utilized During Treatment  Gait belt    Activity Tolerance  Patient tolerated treatment well;No increased pain    Behavior During Therapy  WFL for tasks assessed/performed       Past Medical History:  Diagnosis Date  . Anemia, unspecified   . Anxiety   . Arthralgia 2010   polyarticular  . Arthritis    "back, knees" (01/10/2017)  . Cancer [facility])    "kidney area" (01/10/2017)  . CHF (congestive heart failure) ([place]) 07/25/2009   denies  . Chronic lower back pain   . Coronary artery disease   . Coughing    pt. reports that he has drainage from sinus infection  . Diabetic foot ulcer ([place])   . Diabetic neuropathy ([place])   . ESRD (end stage renal disease) on dialysis [facility])    started 12/2012; "MWF; [facility] "  (01/10/2017)  . GERD (gastroesophageal reflux disease)    hx "before I lost weight", no problem 9 years  . Hemodialysis access site with mature fistula ([place])   . Hemorrhoids, internal 10/2011   small  . High cholesterol   . History of blood transfusion    "related to the anemia"  . Hypertension   . Insomnia, unspecified   . Lacunar infarction ([place]) 2006   RUE/RLE, speech  . Long term (current) use of anticoagulants   . Myocardial infarction ([place]) 1995  . Orthostatic hypotension   . Osteomyelitis of foot, left, acute ([place])   . Other chronic  postoperative pain   . Pneumonia    "probably twice" (01/10/2017)  . Polymyalgia rheumatica ([place])   . Renal insufficiency   . Sleep apnea    "lost weight; no more problem" (01/10/2017)  . Stroke ([place]) 01/10/06   denies residual on 05/09/2014  . Type II diabetes mellitus ([place]) dx'd 1995  . Unspecified hereditary and idiopathic peripheral neuropathy    feet  . Unspecified osteomyelitis, site unspecified   . Unspecified vitamin D deficiency     Past Surgical History:  Procedure Laterality Date  . ABDOMINAL AORTOGRAM N/A 08/25/2016   Procedure: ABDOMINAL AORTOGRAM;  Surgeon: Wellington Hampshire, MD;  Location: [place] CV [facility];  Service: Cardiovascular;  Laterality: N/A;  . AMPUTATION  01/21/2012   Procedure: AMPUTATION RAY;  Surgeon: [name], MD;  Location: [facility];  Service: Orthopedics;  Laterality: Left;  Left Foot 4th Ray Amputation  . AMPUTATION Left 05/04/2013   Procedure: AMPUTATION DIGIT;  Surgeon: [name], MD;  Location: [facility];  Service: Orthopedics;  Laterality: Left;  Left Great Toe Amputation at [facility]  . AMPUTATION Left 01/14/2017   Procedure: AMPUTATION ABOVE LEFT KNEE;  Surgeon: [name], MD;  Location: [facility];  Service: Orthopedics;  Laterality: Left;  .  ANTERIOR CERVICAL DECOMP/DISCECTOMY FUSION  02/2011  . BACK SURGERY    . BASCILIC VEIN TRANSPOSITION Left 10/19/2012   Procedure: BASCILIC VEIN TRANSPOSITION;  Surgeon: Serafina Mitchell, MD;  Location: [facility];  Service: Vascular;  Laterality: Left;  . CARDIAC CATHETERIZATION     "before bypass"  . CORONARY ARTERY BYPASS GRAFT     x 5 with lima at [facility] WITH ANTIBIOTIC SPACERS Left 08/07/2014   Procedure: Replace Left Total Knee Arthroplasty,  Place Antibiotic Spacer;  Surgeon: [name], MD;  Location: [facility];  Service: Orthopedics;  Laterality: Left;  . I&D EXTREMITY Left 05/09/2014   Procedure: Irrigation and Debridement Left Knee and Closure of Total Knee  Arthroplasty Incision;  Surgeon: [name], MD;  Location: [facility];  Service: Orthopedics;  Laterality: Left;  . I&D KNEE WITH POLY EXCHANGE Left 05/31/2014   Procedure: IRRIGATION AND DEBRIDEMENT LEFT KNEE, PLACE ANTIBIOTIC BEADS,  POLY EXCHANGE;  Surgeon: [name], MD;  Location: [facility];  Service: Orthopedics;  Laterality: Left;  . IRRIGATION AND DEBRIDEMENT KNEE Left 01/12/2017   Procedure: IRRIGATION AND DEBRIDEMENT LEFT KNEE;  Surgeon: [name], MD;  Location: [facility];  Service: Orthopedics;  Laterality: Left;  . JOINT REPLACEMENT    . KNEE ARTHROSCOPY Left 08-25-2012  . LOWER EXTREMITY ANGIOGRAPHY Left 08/25/2016   Procedure: Lower Extremity Angiography;  Surgeon: Wellington Hampshire, MD;  Location: [place] [facility];  Service: Cardiovascular;  Laterality: Left;  . PERIPHERAL VASCULAR BALLOON ANGIOPLASTY Left 08/25/2016   Procedure: PERIPHERAL VASCULAR BALLOON ANGIOPLASTY;  Surgeon: Wellington Hampshire, MD;  Location: [place] [facility];  Service: Cardiovascular;  Laterality: Left;  lt peroneal and ant tibial arteries cutting balloon  . REFRACTIVE SURGERY Bilateral   . TOE AMPUTATION Bilateral    "I've lost 7 toes over the last 7 years" (05/09/2014)  . TOE SURGERY Left April 2015   Big toe removed on left foot.  . TONSILLECTOMY    . TOTAL KNEE ARTHROPLASTY Left 04/10/2014   Procedure: TOTAL KNEE ARTHROPLASTY;  Surgeon: [name], MD;  Location: [facility];  Service: Orthopedics;  Laterality: Left;  . TOTAL KNEE REVISION Left 10/25/2014   Procedure: LEFT TOTAL KNEE REVISION;  Surgeon: [name], MD;  Location: [facility];  Service: Orthopedics;  Laterality: Left;  . TOTAL KNEE REVISION Left 11/26/2015   Procedure: Removal Left Total Knee Arthroplasty, Hinged Total Knee Arthroplasty;  Surgeon: [name], MD;  Location: [facility];  Service: Orthopedics;  Laterality: Left;  . UVULOPALATOPHARYNGOPLASTY, TONSILLECTOMY AND SEPTOPLASTY  ~ 1989  . WOUND DEBRIDEMENT Left 05/09/2014   Dehiscence  Left Total Knee Arthroplasty Incision    There were no vitals filed for this visit.  Subjective Assessment - 02/02/18 0854    Subjective  Prosthesisis is coming off when he got of car in PT's parking lot.     Pertinent History  Left TFA, ESRD with dialysis M,W,F, CHF, CAD s/p CABG 05/2016, PAD, CVA, DM2, neuropathy, arthritis, right toe amputations    Patient Stated Goals  He wants to use prosthesis to walk without assistance, garden, cook / work in [employer]     Currently in Pain?  No/denies                       [facility] Adult PT Treatment/Exercise - 02/02/18 0853      Transfers   Transfers  Sit to Stand;Stand to Sit  Sit to Stand  5: Supervision;With upper extremity assist;With armrests;From chair/3-in-1   now can stabilize without UE support   Sit to Stand Details (indicate cue type and reason)  demo, tactile & verbal cues on technique to engage prosthesis    Stand to Sit  5: Supervision;With upper extremity assist;With armrests;To chair/3-in-1    Stand to Sit Details  demo, tactile & verbal cues on technique to engage prosthesis      Ambulation/Gait   Ambulation/Gait  Yes    Ambulation/Gait Assistance  4: Min assist;5: Supervision;4: Min guard    Ambulation/Gait Assistance Details  PT cued on 2-pt reciprocal pattern with forearm crutches to faciltate shoulder counter rotation & balance and working to decrease UE weight bearing. Not walking holding crutches up as minimal to no upper body counter trunk rotation.  Gait with cane with verbal & tactile cues on upright posture, advancing prosthesis with pelvic motion and wt shift over prosthesis in stance    Ambulation Distance (Feet)  300 Feet   300' crutches, 200' cane   Assistive device  Prosthesis;Straight cane;Lofstrands;R Forearm Crutch;L Forearm Crutch    Gait Pattern  Step-through pattern;Decreased arm swing - left;Decreased step length - right;Decreased stance time - left;Decreased hip/knee flexion - left;Decreased  weight shift to left;Left hip hike;Left circumduction;Antalgic;Lateral hip instability;Trunk rotated posteriorly on left;Abducted - left    Ambulation Surface  Indoor;Level      Prosthetics   Prosthetic Care Comments   PT instructed in how to determine if liner has slipped down, placing suction ring higher on liner / limb where diameter is larger and buying pants with lower section zip off to enable him to access prosthesis easier to adjust ply socks due to flucutating edema with dialysis.  Donning with neutral pelvic rotation.    Current prosthetic wear tolerance (days/week)   daily    Current prosthetic wear tolerance (#hours/day)   reports all awake hours with a break somedays after dialysis for napping    Edema  fluctuating due to dialysis    Residual limb condition   no open areas or rash,     Education Provided  Correct ply sock adjustment;Proper Donning;Other (comment)   see prosthetic care comments   Person(s) Educated  Patient    Education Method  Explanation;Demonstration;Verbal cues;Tactile cues    Education Method  Verbalized understanding;Returned demonstration;Tactile cues required;Verbal cues required;Needs further instruction    Donning Prosthesis  Supervision    Doffing Prosthesis  Modified independent (device/increased time)               PT Short Term Goals - 02/02/18 2323      PT SHORT TERM GOAL #1   Title  Patient ambulates 300' cane & prosthesis scanning environment with supervision. (All STGs Target Date: 02/16/2018)    Time  1    Period  Months    Status  On-going    Target Date  02/16/18      PT SHORT TERM GOAL #2   Title  Patient negotiates ramps & curbs with cane & prosthesis with minA/ guard.    Baseline       Time  1    Period  Months    Status  On-going    Target Date  02/16/18      PT SHORT TERM GOAL #3   Title  Patient descends stairs with 2 rails & prosthesis alternate pattern with supervision & 1 rail/cane with minA.     Baseline  Time  1    Period  Months    Status  On-going    Target Date  02/16/18      PT SHORT TERM GOAL #4   Title  Patient lifts 10# crate from floor engaging prosthesis with supervision.     Baseline       Time  1    Period  Months    Status  On-going    Target Date  02/16/18      PT SHORT TERM GOAL #5   Title  Patient ambulates 100' with prosthesis only carrying plate with minimal guard.    Baseline       Time  1    Period  Months    Status  On-going    Target Date  02/16/18        PT Long Term Goals - 02/02/18 2324      PT LONG TERM GOAL #1   Title  Patient verbalizes proper prosthetic care including problem solving issues like socket fit with weight changes, wearing >90% of awake hours without skin issues and when to contact prosthetist. (All updated LTGs Target Date: 04/14/2018)    Time  3    Period  Months    Status  On-going    Target Date  04/14/18      PT LONG TERM GOAL #2   Title  Dynamic Gait Index with cane or less and prosthesis >/= 14/24 to indicate lower fall risk.     Time  3    Period  Months    Status  On-going    Target Date  04/14/18      PT LONG TERM GOAL #3   Title  Berg Balance with device >/= 52/56 to indicate lower fall risk.     Time  3    Period  Months    Status  On-going    Target Date  04/14/18      PT LONG TERM GOAL #4   Title  Patient ambulates 1000' outdoors including grass with cane or less and prosthesis modified independent.     Time  3    Period  Months    Status  On-going    Target Date  04/14/18      PT LONG TERM GOAL #5   Title  patient negotiates stairs with single rail, ramps & curbs with cane or less and prosthesis modified independent.     Time  3    Period  Months    Status  On-going    Target Date  04/14/18      PT LONG TERM GOAL #6   Title  Patient ambulates around furniture with prosthesis only carrying light weight household items modified independent.     Time  3    Period  Months    Status  On-going    Target  Date  04/14/18            Plan - 02/02/18 2324    Clinical Impression Statement  PT instructed & pt return demo in proper donning to minimize pistoning & loss of suction. PT also instructed in importance of upper body counter trunk rotation for balance in gait and recommendation for 2-pt gait with forearm crutches vs holding them up in air.     Rehab Potential  Good    PT Frequency  2x / week    PT Duration  Other (comment)   13 weeks (90 days) / 3 months   PT  Treatment/Interventions  ADLs/Self Care Home Management;DME Instruction;Gait training;Stair training;Functional mobility training;Therapeutic activities;Therapeutic exercise;Balance training;Neuromuscular re-education;Canalith Repostioning;Patient/family education;Prosthetic Training;Vestibular    PT Next Visit Plan  community based prosthetic gait activities with cane and household without device, balance activities    Consulted and Agree with Plan of Care  Patient       Patient will benefit from skilled therapeutic intervention in order to improve the following deficits and impairments:  Decreased activity tolerance, Abnormal gait, Decreased balance, Decreased endurance, Decreased knowledge of use of DME, Decreased mobility, Decreased strength, Prosthetic Dependency, Postural dysfunction, Dizziness  Visit Diagnosis: Other abnormalities of gait and mobility  Muscle weakness (generalized)  Unsteadiness on feet  Abnormal posture  History of falling     Problem List Patient Active Problem List   Diagnosis Date Noted  . Altered mental status   . Cerebral thrombosis with cerebral infarction 02/06/2017  . Cerebral embolism with cerebral infarction 02/06/2017  . Above knee amputation status, left   . Pressure injury of skin 01/30/2017  . Acute lower UTI 01/29/2017  . Acute metabolic encephalopathy 63/84/5364  . UTI (urinary tract infection) 01/29/2017  . Quadriceps muscle rupture, left, initial encounter   . Fall  01/09/2017  . Gait disturbance 01/09/2017  . Staphylococcus aureus infection 01/09/2017  . Infection of prosthetic left knee joint ([place]) 01/09/2017  . Fever, unknown origin 11/09/2016  . Critical lower limb ischemia 08/25/2016  . Ulcer of left midfoot with fat layer exposed ([place]) 08/13/2016  . Diabetic ulcer of left midfoot associated with type 2 diabetes mellitus, with fat layer exposed ([place]) 07/25/2016  . Peripheral neuropathy 07/22/2016  . Tobacco abuse 07/22/2016  . CAD in native artery 05/18/2016  . CAD, multiple vessel 05/11/2016  . Positive cardiac stress test 05/11/2016  . Abnormal stress test 04/30/2016  . Pre-transplant evaluation for kidney transplant 04/30/2016  . S/P revision of total knee 11/26/2015  . Pain in the chest   . Acute on chronic diastolic heart failure ([place]) 07/05/2015  . Volume overload 07/04/2015  . Shortness of breath 07/04/2015  . Hypoxemia 07/04/2015  . Elevated troponin   . End-stage renal disease on hemodialysis ([place])   . Hypervolemia   . 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 Pyogenic bacterial arthritis of knee, left ([place]) 08/07/2014  . Tachycardia 07/24/2014  . Acute upper respiratory infection 07/24/2014  . ESRD on dialysis ([place]) 07/14/2014  . Type II diabetes mellitus ([place]) 07/14/2014  . Anemia in chronic kidney disease 07/14/2014  . Congestive heart failure (CHF) ([place]) 07/13/2014  . Surgical wound dehiscence 05/09/2014  . Dehiscence of closure of skin 05/09/2014  . Total knee replacement status 04/10/2014  . Diabetes mellitus with renal manifestations, controlled ([place]) 10/24/2013  . Hypertensive renal disease 06/27/2013  . DM type 2 causing vascular disease ([place]) 06/27/2013  . Erectile dysfunction 06/27/2013  . Depression 06/27/2013  . Claudication of left lower extremity ([place]) 12/19/2012  . Essential hypertension, benign 12/19/2012  . Sinusitis, acute maxillary 11/22/2012  . Otitis, externa, infective 11/14/2012  . Leg  edema, left 11/14/2012  . End stage renal disease ([place]) 10/02/2012  . Controlled type 2 DM with proteinuria or microalbuminuria 09/19/2012  . GERD (gastroesophageal reflux disease) 09/19/2012  . Leukocytosis 09/19/2012  . Lacunar infarction ([place]) 08/17/2012  . Polymyalgia rheumatica ([place]) 08/17/2012  . 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 Essential hypertension 05/10/2012  . Vitamin D deficiency 05/10/2012  . Diabetes mellitus due to underlying condition ([place]) 05/10/2012  . Hyperlipidemia LDL goal <100 05/10/2012  .  Anemia of chronic disease 05/10/2012  . Screening for prostate cancer 05/10/2012  . Chronic kidney disease (CKD), stage IV (severe) ([place]) 05/10/2012  . Peripheral autonomic neuropathy due to DM ([place]) 05/10/2012  . Callus of foot 05/10/2012  . Urgency of urination 05/10/2012  . Hyperkalemia 05/10/2012  . Candidiasis of the esophagus 10/12/2011  . Internal hemorrhoids without mention of complication 57/32/2025  . Pre-syncope 07/25/2009  . DJD (degenerative joint disease) of cervical spine 02/17/2009    Eily Louvier PT, DPT 02/02/2018, 11:29 PM  [facility] [address] [place], [place], [address] Phone: [phone]   Fax:  [phone]  Name: Matthew Kline MRN: [id number] Date of Birth: [birth year]

## 2018-02-07 ENCOUNTER — Ambulatory Visit: Payer: Medicare HMO

## 2018-02-09 ENCOUNTER — Encounter: Payer: Self-pay | Admitting: Physical Therapy

## 2018-02-09 ENCOUNTER — Ambulatory Visit: Payer: Medicare HMO | Admitting: Physical Therapy

## 2018-02-09 DIAGNOSIS — R2689 Other abnormalities of gait and mobility: Secondary | ICD-10-CM

## 2018-02-09 DIAGNOSIS — Z9181 History of falling: Secondary | ICD-10-CM

## 2018-02-09 DIAGNOSIS — M6281 Muscle weakness (generalized): Secondary | ICD-10-CM

## 2018-02-09 DIAGNOSIS — R2681 Unsteadiness on feet: Secondary | ICD-10-CM

## 2018-02-09 DIAGNOSIS — R293 Abnormal posture: Secondary | ICD-10-CM

## 2018-02-09 NOTE — Therapy (Signed)
[facility] [address] [place], [place], [address] Phone: [phone]   Fax:  [phone]  Physical Therapy Treatment  Patient Details  Name: Matthew Kline MRN: [id number] Date of Birth: 04/28/1960 Referring Provider (PT): Meridee Score, MD   Encounter Date: 02/09/2018  PT End of Session - 02/09/18 0946    Visit Number  35    Number of Visits  4    Date for PT Re-Evaluation  04/17/18    Authorization Type  [employer]    Authorization Time Period       PT Start Time  0848    PT Stop Time  0930    PT Time Calculation (min)  42 min    Equipment Utilized During Treatment  Gait belt    Activity Tolerance  Patient tolerated treatment well;No increased pain    Behavior During Therapy  WFL for tasks assessed/performed       Past Medical History:  Diagnosis Date  . Anemia, unspecified   . Anxiety   . Arthralgia 2010   polyarticular  . Arthritis    "back, knees" (01/10/2017)  . Cancer [facility])    "kidney area" (01/10/2017)  . CHF (congestive heart failure) ([place]) 07/25/2009   denies  . Chronic lower back pain   . Coronary artery disease   . Coughing    pt. reports that he has drainage from sinus infection  . Diabetic foot ulcer ([place])   . Diabetic neuropathy ([place])   . ESRD (end stage renal disease) on dialysis [facility])    started 12/2012; "MWF; [place] [facility] "  (01/10/2017)  . GERD (gastroesophageal reflux disease)    hx "before I lost weight", no problem 9 years  . Hemodialysis access site with mature fistula ([place])   . Hemorrhoids, internal 10/2011   small  . High cholesterol   . History of blood transfusion    "related to the anemia"  . Hypertension   . Insomnia, unspecified   . Lacunar infarction ([place]) 2006   RUE/RLE, speech  . Long term (current) use of anticoagulants   . Myocardial infarction ([place]) 1995  . Orthostatic hypotension   . Osteomyelitis of foot, left, acute ([place])   . Other chronic  postoperative pain   . Pneumonia    "probably twice" (01/10/2017)  . Polymyalgia rheumatica ([place])   . Renal insufficiency   . Sleep apnea    "lost weight; no more problem" (01/10/2017)  . Stroke ([place]) 01/10/06   denies residual on 05/09/2014  . Type II diabetes mellitus ([place]) dx'd 1995  . Unspecified hereditary and idiopathic peripheral neuropathy    feet  . Unspecified osteomyelitis, site unspecified   . Unspecified vitamin D deficiency     Past Surgical History:  Procedure Laterality Date  . ABDOMINAL AORTOGRAM N/A 08/25/2016   Procedure: ABDOMINAL AORTOGRAM;  Surgeon: Wellington Hampshire, MD;  Location: [place] [facility];  Service: Cardiovascular;  Laterality: N/A;  . AMPUTATION  01/21/2012   Procedure: AMPUTATION RAY;  Surgeon: [name], MD;  Location: [place];  Service: Orthopedics;  Laterality: Left;  Left Foot 4th Ray Amputation  . AMPUTATION Left 05/04/2013   Procedure: AMPUTATION DIGIT;  Surgeon: [name], MD;  Location: [place];  Service: Orthopedics;  Laterality: Left;  Left Great Toe Amputation at [facility]  . AMPUTATION Left 01/14/2017   Procedure: AMPUTATION ABOVE LEFT KNEE;  Surgeon: [name], MD;  Location: [facility];  Service: Orthopedics;  Laterality: Left;  .  ANTERIOR CERVICAL DECOMP/DISCECTOMY FUSION  02/2011  . BACK SURGERY    . BASCILIC VEIN TRANSPOSITION Left 10/19/2012   Procedure: BASCILIC VEIN TRANSPOSITION;  Surgeon: Serafina Mitchell, MD;  Location: [facility];  Service: Vascular;  Laterality: Left;  . CARDIAC CATHETERIZATION     "before bypass"  . CORONARY ARTERY BYPASS GRAFT     x 5 with lima at [place] WITH ANTIBIOTIC SPACERS Left 08/07/2014   Procedure: Replace Left Total Knee Arthroplasty,  Place Antibiotic Spacer;  Surgeon: [name], MD;  Location: [facility];  Service: Orthopedics;  Laterality: Left;  . I&D EXTREMITY Left 05/09/2014   Procedure: Irrigation and Debridement Left Knee and Closure of Total Knee  Arthroplasty Incision;  Surgeon: [name], MD;  Location: [facility];  Service: Orthopedics;  Laterality: Left;  . I&D KNEE WITH POLY EXCHANGE Left 05/31/2014   Procedure: IRRIGATION AND DEBRIDEMENT LEFT KNEE, PLACE ANTIBIOTIC BEADS,  POLY EXCHANGE;  Surgeon: [name], MD;  Location: [facility] [facility];  Service: Orthopedics;  Laterality: Left;  . IRRIGATION AND DEBRIDEMENT KNEE Left 01/12/2017   Procedure: IRRIGATION AND DEBRIDEMENT LEFT KNEE;  Surgeon: [name], MD;  Location: [facility];  Service: Orthopedics;  Laterality: Left;  . JOINT REPLACEMENT    . KNEE ARTHROSCOPY Left 08-25-2012  . LOWER EXTREMITY ANGIOGRAPHY Left 08/25/2016   Procedure: Lower Extremity Angiography;  Surgeon: Wellington Hampshire, MD;  Location: [place] [facility];  Service: Cardiovascular;  Laterality: Left;  . PERIPHERAL VASCULAR BALLOON ANGIOPLASTY Left 08/25/2016   Procedure: PERIPHERAL VASCULAR BALLOON ANGIOPLASTY;  Surgeon: Wellington Hampshire, MD;  Location: [place] [facility];  Service: Cardiovascular;  Laterality: Left;  lt peroneal and ant tibial arteries cutting balloon  . REFRACTIVE SURGERY Bilateral   . TOE AMPUTATION Bilateral    "I've lost 7 toes over the last 7 years" (05/09/2014)  . TOE SURGERY Left April 2015   Big toe removed on left foot.  . TONSILLECTOMY    . TOTAL KNEE ARTHROPLASTY Left 04/10/2014   Procedure: TOTAL KNEE ARTHROPLASTY;  Surgeon: [name], MD;  Location: [place];  Service: Orthopedics;  Laterality: Left;  . TOTAL KNEE REVISION Left 10/25/2014   Procedure: LEFT TOTAL KNEE REVISION;  Surgeon: [name], MD;  Location: [facility];  Service: Orthopedics;  Laterality: Left;  . TOTAL KNEE REVISION Left 11/26/2015   Procedure: Removal Left Total Knee Arthroplasty, Hinged Total Knee Arthroplasty;  Surgeon: [name], MD;  Location: [facility];  Service: Orthopedics;  Laterality: Left;  . UVULOPALATOPHARYNGOPLASTY, TONSILLECTOMY AND SEPTOPLASTY  ~ 1989  . WOUND DEBRIDEMENT Left 05/09/2014   Dehiscence  Left Total Knee Arthroplasty Incision    There were no vitals filed for this visit.  Subjective Assessment - 02/09/18 0845    Subjective  No falls. He got his prosthetic knee back. He is donning prosthesis with ring higher as PT recommended and prosthetist add gel ring so easier with less adjusting ply socks.  On 2/4 he has MD appt & 2nd CT scan.     Pertinent History  Left TFA, ESRD with dialysis M,W,F, CHF, CAD s/p CABG 05/2016, PAD, CVA, DM2, neuropathy, arthritis, right toe amputations    Patient Stated Goals  He wants to use prosthesis to walk without assistance, garden, cook / work in [employer]     Currently in Pain?  No/denies  [facility] Adult PT Treatment/Exercise - 02/09/18 0847      Transfers   Transfers  Sit to Stand;Stand to Sit    Sit to Stand  5: Supervision;With upper extremity assist;With armrests;From chair/3-in-1   now can stabilize without UE support   Sit to Stand Details  Visual cues/gestures for sequencing;Verbal cues for sequencing;Verbal cues for technique    Sit to Stand Details (indicate cue type and reason)  worked on engaging prosthesis    Stand to Sit  5: Supervision;With upper extremity assist;With armrests;To chair/3-in-1    Stand to Sit Details (indicate cue type and reason)  Visual cues/gestures for sequencing;Verbal cues for sequencing;Verbal cues for technique    Stand to Sit Details  worked on engaging prosthesis      Ambulation/Gait   Ambulation/Gait  Yes    Ambulation/Gait Assistance  5: Supervision;4: Min guard    Ambulation/Gait Assistance Details  worked on incorporating pre-gait / pelvic motions in fluent gait    Ambulation Distance (Feet)  300 Feet   300' cane & 300' single crutch   Assistive device  Prosthesis;Straight cane;R Forearm Crutch    Gait Pattern  Step-through pattern;Decreased arm swing - left;Decreased step length - right;Decreased stance time - left;Decreased hip/knee flexion - left;Decreased weight  shift to left;Left hip hike;Left circumduction;Antalgic;Lateral hip instability;Trunk rotated posteriorly on left;Abducted - left    Ambulation Surface  Indoor;Level    Pre-Gait Activities  see pt instructions; counter on left side /LUE support & cructh RUE      High Level Balance   High Level Balance Activities  Head turns   scanning right/left & up/down   High Level Balance Comments  demo, tactile & verbal cues on engaging prosthesis      Neuro Re-ed    Neuro Re-ed Details   standing with prosthetic toe ~1" posterior to sound toes to facilitate prosthetic knee maintaining extension with minimal effort      Prosthetics   Prosthetic Care Comments   reviewed verbally donning.    Current prosthetic wear tolerance (days/week)   daily    Current prosthetic wear tolerance (#hours/day)   reports all awake hours with a break somedays after dialysis for napping    Edema  fluctuating due to dialysis    Residual limb condition   no open areas or rash,     Education Provided  Correct ply sock adjustment;Proper Donning;Other (comment)   see prosthetic care comments   Person(s) Educated  Patient    Education Method  Explanation;Verbal cues    Education Method  Verbalized understanding;Verbal cues required;Needs further instruction    Donning Prosthesis  Supervision    Doffing Prosthesis  Modified independent (device/increased time)         Stand at [employer] top (on your left).  Keep hips fairly close to counter top (like 2" at most) and cane in right hand.    Stand with sound limb forward step. Draw imaginary line in front of prosthesis & place right foot over the line.  Step 1: Start with weight on prosthesis. Weight shift forward to right leg keeping right knee straight without locking it (don't squat!!). Prosthetic knee should bend and keep prosthetic toe on ground.  Repeat step 1 for 10 reps.  Step 2: Bring prosthesis forward with pelvic rotation & hip flexion (moving leg straight  forward-not out to the side-think don't hit the counter top) "placing heel" on floor. Repeat step 1 + step 2 for 10 reps  Step 3: Weight shift  forward so pelvis (hips) over prosthesis & right knee is flexing (keep leg relaxed). Maintain right toe on ground. Repeat step 1 + 2 + 3 for 10 reps  Step 4: Step right foot forward stepping over imaginary line (pass the toe of your left foot with the heel of your right foot) in front of prosthesis. Repeat step 1 + 2 + 3 + 4 for 10 reps  *Keep an eye on your foot placement and adjust back to starting position as needed.      PT Education - 02/09/18 0925    Education Details  sit to/from stand and pre-gait    Person(s) Educated  Patient    Methods  Explanation;Demonstration;Tactile cues;Verbal cues;Handout    Comprehension  Verbalized understanding;Returned demonstration;Verbal cues required;Tactile cues required;Need further instruction       PT Short Term Goals - 02/02/18 2323      PT SHORT TERM GOAL #1   Title  Patient ambulates 300' cane & prosthesis scanning environment with supervision. (All STGs Target Date: 02/16/2018)    Time  1    Period  Months    Status  On-going    Target Date  02/16/18      PT SHORT TERM GOAL #2   Title  Patient negotiates ramps & curbs with cane & prosthesis with minA/ guard.    Baseline       Time  1    Period  Months    Status  On-going    Target Date  02/16/18      PT SHORT TERM GOAL #3   Title  Patient descends stairs with 2 rails & prosthesis alternate pattern with supervision & 1 rail/cane with minA.     Baseline       Time  1    Period  Months    Status  On-going    Target Date  02/16/18      PT SHORT TERM GOAL #4   Title  Patient lifts 10# crate from floor engaging prosthesis with supervision.     Baseline       Time  1    Period  Months    Status  On-going    Target Date  02/16/18      PT SHORT TERM GOAL #5   Title  Patient ambulates 100' with prosthesis only carrying plate with  minimal guard.    Baseline       Time  1    Period  Months    Status  On-going    Target Date  02/16/18        PT Long Term Goals - 02/02/18 2324      PT LONG TERM GOAL #1   Title  Patient verbalizes proper prosthetic care including problem solving issues like socket fit with weight changes, wearing >90% of awake hours without skin issues and when to contact prosthetist. (All updated LTGs Target Date: 04/14/2018)    Time  3    Period  Months    Status  On-going    Target Date  04/14/18      PT LONG TERM GOAL #2   Title  Dynamic Gait Index with cane or less and prosthesis >/= 14/24 to indicate lower fall risk.     Time  3    Period  Months    Status  On-going    Target Date  04/14/18      PT LONG TERM GOAL #3   Title  Oceanographer with  device >/= 52/56 to indicate lower fall risk.     Time  3    Period  Months    Status  On-going    Target Date  04/14/18      PT LONG TERM GOAL #4   Title  Patient ambulates 1000' outdoors including grass with cane or less and prosthesis modified independent.     Time  3    Period  Months    Status  On-going    Target Date  04/14/18      PT LONG TERM GOAL #5   Title  patient negotiates stairs with single rail, ramps & curbs with cane or less and prosthesis modified independent.     Time  3    Period  Months    Status  On-going    Target Date  04/14/18      PT LONG TERM GOAL #6   Title  Patient ambulates around furniture with prosthesis only carrying light weight household items modified independent.     Time  3    Period  Months    Status  On-going    Target Date  04/14/18            Plan - 02/09/18 1018    Clinical Impression Statement  Patient improved sit to/from stand and pelvic motions to control prosthesis in gait with skilled PT instructions in technique.     Rehab Potential  Good    PT Frequency  2x / week    PT Duration  Other (comment)   13 weeks (90 days) / 3 months   PT Treatment/Interventions  ADLs/Self Care  Home Management;DME Instruction;Gait training;Stair training;Functional mobility training;Therapeutic activities;Therapeutic exercise;Balance training;Neuromuscular re-education;Canalith Repostioning;Patient/family education;Prosthetic Training;Vestibular    PT Next Visit Plan  check STGs, community based prosthetic gait activities with cane and household without device, balance activities    Consulted and Agree with Plan of Care  Patient       Patient will benefit from skilled therapeutic intervention in order to improve the following deficits and impairments:  Decreased activity tolerance, Abnormal gait, Decreased balance, Decreased endurance, Decreased knowledge of use of DME, Decreased mobility, Decreased strength, Prosthetic Dependency, Postural dysfunction, Dizziness  Visit Diagnosis: Other abnormalities of gait and mobility  Muscle weakness (generalized)  Unsteadiness on feet  Abnormal posture  History of falling     Problem List Patient Active Problem List   Diagnosis Date Noted  . Altered mental status   . Cerebral thrombosis with cerebral infarction 02/06/2017  . Cerebral embolism with cerebral infarction 02/06/2017  . Above knee amputation status, left   . Pressure injury of skin 01/30/2017  . Acute lower UTI 01/29/2017  . Acute metabolic encephalopathy 54/62/7035  . UTI (urinary tract infection) 01/29/2017  . Quadriceps muscle rupture, left, initial encounter   . Fall 01/09/2017  . Gait disturbance 01/09/2017  . Staphylococcus aureus infection 01/09/2017  . Infection of prosthetic left knee joint ([place]) 01/09/2017  . Fever, unknown origin 11/09/2016  . Critical lower limb ischemia 08/25/2016  . Ulcer of left midfoot with fat layer exposed ([place]) 08/13/2016  . Diabetic ulcer of left midfoot associated with type 2 diabetes mellitus, with fat layer exposed ([place]) 07/25/2016  . Peripheral neuropathy 07/22/2016  . Tobacco abuse 07/22/2016  . CAD in native artery  05/18/2016  . CAD, multiple vessel 05/11/2016  . Positive cardiac stress test 05/11/2016  . Abnormal stress test 04/30/2016  . Pre-transplant evaluation for kidney transplant 04/30/2016  . S/P revision  of total knee 11/26/2015  . Pain in the chest   . Acute on chronic diastolic heart failure ([place]) 07/05/2015  . Volume overload 07/04/2015  . Shortness of breath 07/04/2015  . Hypoxemia 07/04/2015  . Elevated troponin   . End-stage renal disease on hemodialysis ([place])   . Hypervolemia   . 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 Pyogenic bacterial arthritis of knee, left ([place]) 08/07/2014  . Tachycardia 07/24/2014  . Acute upper respiratory infection 07/24/2014  . ESRD on dialysis ([place]) 07/14/2014  . Type II diabetes mellitus ([place]) 07/14/2014  . Anemia in chronic kidney disease 07/14/2014  . Congestive heart failure (CHF) ([place]) 07/13/2014  . Surgical wound dehiscence 05/09/2014  . Dehiscence of closure of skin 05/09/2014  . Total knee replacement status 04/10/2014  . Diabetes mellitus with renal manifestations, controlled ([place]) 10/24/2013  . Hypertensive renal disease 06/27/2013  . DM type 2 causing vascular disease ([place]) 06/27/2013  . Erectile dysfunction 06/27/2013  . Depression 06/27/2013  . Claudication of left lower extremity ([place]) 12/19/2012  . Essential hypertension, benign 12/19/2012  . Sinusitis, acute maxillary 11/22/2012  . Otitis, externa, infective 11/14/2012  . Leg edema, left 11/14/2012  . End stage renal disease ([place]) 10/02/2012  . Controlled type 2 DM with proteinuria or microalbuminuria 09/19/2012  . GERD (gastroesophageal reflux disease) 09/19/2012  . Leukocytosis 09/19/2012  . Lacunar infarction ([place]) 08/17/2012  . Polymyalgia rheumatica ([place]) 08/17/2012  . 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 Essential hypertension 05/10/2012  . Vitamin D deficiency 05/10/2012  . Diabetes mellitus due to underlying condition ([place]) 05/10/2012  . Hyperlipidemia  LDL goal <100 05/10/2012  . Anemia of chronic disease 05/10/2012  . Screening for prostate cancer 05/10/2012  . Chronic kidney disease (CKD), stage IV (severe) ([place]) 05/10/2012  . Peripheral autonomic neuropathy due to DM ([place]) 05/10/2012  . Callus of foot 05/10/2012  . Urgency of urination 05/10/2012  . Hyperkalemia 05/10/2012  . Candidiasis of the esophagus 10/12/2011  . Internal hemorrhoids without mention of complication 38/75/6433  . Pre-syncope 07/25/2009  . DJD (degenerative joint disease) of cervical spine 02/17/2009    Jamey Reas PT, DPT 02/09/2018, 10:21 AM  [facility] [address] [place], [place], [address] Phone: [phone]   Fax:  [phone]  Name: Adynn Caseres MRN: [id number] Date of Birth: Aug 09, 1960

## 2018-02-09 NOTE — Patient Instructions (Signed)
Stand at [employer] (on your left).  Keep hips fairly close to counter top (like 2" at most) and cane in right hand.    Stand with sound limb forward step. Draw imaginary line in front of prosthesis & place right foot over the line.  Step 1: Start with weight on prosthesis. Weight shift forward to right leg keeping right knee straight without locking it (don't squat!!). Prosthetic knee should bend and keep prosthetic toe on ground.  Repeat step 1 for 10 reps.  Step 2: Bring prosthesis forward with pelvic rotation & hip flexion (moving leg straight forward-not out to the side-think don't hit the counter top) "placing heel" on floor. Repeat step 1 + step 2 for 10 reps  Step 3: Weight shift forward so pelvis (hips) over prosthesis & right knee is flexing (keep leg relaxed). Maintain right toe on ground. Repeat step 1 + 2 + 3 for 10 reps  Step 4: Step right foot forward stepping over imaginary line (pass the toe of your left foot with the heel of your right foot) in front of prosthesis. Repeat step 1 + 2 + 3 + 4 for 10 reps  *Keep an eye on your foot placement and adjust back to starting position as needed.

## 2018-02-14 ENCOUNTER — Ambulatory Visit: Payer: Medicare HMO | Attending: Orthopedic Surgery | Admitting: Physical Therapy

## 2018-02-14 DIAGNOSIS — M6281 Muscle weakness (generalized): Secondary | ICD-10-CM | POA: Insufficient documentation

## 2018-02-14 DIAGNOSIS — R2681 Unsteadiness on feet: Secondary | ICD-10-CM | POA: Insufficient documentation

## 2018-02-14 DIAGNOSIS — R2689 Other abnormalities of gait and mobility: Secondary | ICD-10-CM | POA: Insufficient documentation

## 2018-02-14 DIAGNOSIS — R293 Abnormal posture: Secondary | ICD-10-CM | POA: Insufficient documentation

## 2018-02-14 DIAGNOSIS — Z9181 History of falling: Secondary | ICD-10-CM | POA: Insufficient documentation

## 2018-02-15 ENCOUNTER — Emergency Department (HOSPITAL_COMMUNITY): Payer: Medicare HMO

## 2018-02-15 ENCOUNTER — Other Ambulatory Visit: Payer: Self-pay

## 2018-02-15 ENCOUNTER — Emergency Department (HOSPITAL_COMMUNITY)
Admission: EM | Admit: 2018-02-15 | Discharge: 2018-02-15 | Disposition: A | Discharge status: Home / Self Care | Payer: Medicare HMO | Attending: Emergency Medicine | Admitting: Emergency Medicine

## 2018-02-15 DIAGNOSIS — Z79899 Other long term (current) drug therapy: Secondary | ICD-10-CM | POA: Insufficient documentation

## 2018-02-15 DIAGNOSIS — I132 Hypertensive heart and chronic kidney disease with heart failure and with stage 5 chronic kidney disease, or end stage renal disease: Secondary | ICD-10-CM | POA: Insufficient documentation

## 2018-02-15 DIAGNOSIS — N186 End stage renal disease: Secondary | ICD-10-CM | POA: Insufficient documentation

## 2018-02-15 DIAGNOSIS — Z87891 Personal history of nicotine dependence: Secondary | ICD-10-CM | POA: Insufficient documentation

## 2018-02-15 DIAGNOSIS — Z7982 Long term (current) use of aspirin: Secondary | ICD-10-CM | POA: Insufficient documentation

## 2018-02-15 DIAGNOSIS — R109 Unspecified abdominal pain: Secondary | ICD-10-CM | POA: Diagnosis not present

## 2018-02-15 DIAGNOSIS — I509 Heart failure, unspecified: Secondary | ICD-10-CM | POA: Insufficient documentation

## 2018-02-15 DIAGNOSIS — K4090 Unilateral inguinal hernia, without obstruction or gangrene, not specified as recurrent: Secondary | ICD-10-CM

## 2018-02-15 DIAGNOSIS — E119 Type 2 diabetes mellitus without complications: Secondary | ICD-10-CM | POA: Insufficient documentation

## 2018-02-15 LAB — CBC WITH DIFFERENTIAL/PLATELET
Abs Immature Granulocytes: 0.02 10*3/uL (ref 0.00–0.07)
Basophils Absolute: 0 10*3/uL (ref 0.0–0.1)
Basophils Relative: 1 %
Eosinophils Absolute: 0.1 10*3/uL (ref 0.0–0.5)
Eosinophils Relative: 4 %
HCT: 33.4 % — ABNORMAL LOW (ref 39.0–52.0)
Hemoglobin: 10.4 g/dL — ABNORMAL LOW (ref 13.0–17.0)
Immature Granulocytes: 1 %
Lymphocytes Relative: 23 %
Lymphs Abs: 0.9 10*3/uL (ref 0.7–4.0)
MCH: 31.1 pg (ref 26.0–34.0)
MCHC: 31.1 g/dL (ref 30.0–36.0)
MCV: 100 fL (ref 80.0–100.0)
MONO ABS: 0.5 10*3/uL (ref 0.1–1.0)
Monocytes Relative: 13 %
Neutro Abs: 2.4 10*3/uL (ref 1.7–7.7)
Neutrophils Relative %: 58 %
Platelets: 177 10*3/uL (ref 150–400)
RBC: 3.34 MIL/uL — ABNORMAL LOW (ref 4.22–5.81)
RDW: 14.8 % (ref 11.5–15.5)
WBC: 4 10*3/uL (ref 4.0–10.5)
nRBC: 0 % (ref 0.0–0.2)

## 2018-02-15 LAB — BASIC METABOLIC PANEL
Anion gap: 12 (ref 5–15)
BUN: 18 mg/dL (ref 6–20)
CO2: 29 mmol/L (ref 22–32)
Calcium: 8.4 mg/dL — ABNORMAL LOW (ref 8.9–10.3)
Chloride: 98 mmol/L (ref 98–111)
Creatinine, Ser: 5.24 mg/dL — ABNORMAL HIGH (ref 0.61–1.24)
GFR calc Af Amer: 13 mL/min — ABNORMAL LOW (ref >60)
GFR calc non Af Amer: 11 mL/min — ABNORMAL LOW (ref >60)
Glucose, Bld: 111 mg/dL — ABNORMAL HIGH (ref 70–99)
Potassium: 4.1 mmol/L (ref 3.5–5.1)
Sodium: 139 mmol/L (ref 135–145)

## 2018-02-15 MED ORDER — FENTANYL CITRATE (PF) 100 MCG/2ML IJ SOLN: 50.0000 ug | Freq: Once | INTRAMUSCULAR | Status: DC

## 2018-02-15 MED ORDER — HYDROMORPHONE HCL 1 MG/ML IJ SOLN
0.5000 mg | Freq: Once | INTRAMUSCULAR | Status: AC
  Administered 2018-02-15: 0.5 mg via INTRAVENOUS
  Filled 2018-02-15: qty 1

## 2018-02-15 MED ORDER — HYDROMORPHONE HCL 1 MG/ML IJ SOLN
1.0000 mg | Freq: Once | INTRAMUSCULAR | Status: AC
  Administered 2018-02-15: 1 mg via INTRAVENOUS
  Filled 2018-02-15: qty 1

## 2018-02-15 NOTE — ED Notes (Signed)
Patient transported to X-ray 

## 2018-02-15 NOTE — ED Provider Notes (Signed)
[facility] EMERGENCY DEPARTMENT Provider Note   CSN: [id number] Arrival date & time: 02/15/18  1726     History   Chief Complaint Chief Complaint  Patient presents with  . Abdominal Pain    HPI Matthew Kline is a 58 y.o. male.  HPI   58 year old male, with a PMH of dialysis, CHF, diabetes, chronic abdominal pain, presents with worsening abdominal pain.  Patient states that since 62 AM today he has had worsening abdominal pain in the right lower quadrant.  He states no history of a hernia in the right groin which has increased in size, hardened and is more painful.  He states usually he is able to push it back in but he has not been able to push it back in today.  He does associated nausea but denies any vomiting.  His last bowel movement was just prior to arrival and he states it was normal.  He denies any hematuria, melena.  Patient only occasionally makes urine.  He denies any fevers, chills, chest pain, shortness of breath.  Patient had a CT scan yesterday for his abdominal pain.  CT showed no acute findings.  Patient last ate at lunch.  Past Medical History:  Diagnosis Date  . Anemia, unspecified   . Anxiety   . Arthralgia 2010   polyarticular  . Arthritis    "back, knees" (01/10/2017)  . Cancer [facility])    "kidney area" (01/10/2017)  . CHF (congestive heart failure) ([place]) 07/25/2009   denies  . Chronic lower back pain   . Coronary artery disease   . Coughing    pt. reports that he has drainage from sinus infection  . Diabetic foot ulcer ([place])   . Diabetic neuropathy ([place])   . ESRD (end stage renal disease) on dialysis [facility])    started 12/2012; "MWF; [place] "  (01/10/2017)  . GERD (gastroesophageal reflux disease)    hx "before I lost weight", no problem 9 years  . Hemodialysis access site with mature fistula ([place])   . Hemorrhoids, internal 10/2011   small  . High cholesterol   . History of blood transfusion    "related to the  anemia"  . Hypertension   . Insomnia, unspecified   . Lacunar infarction ([place]) 2006   RUE/RLE, speech  . Long term (current) use of anticoagulants   . Myocardial infarction ([place]) 1995  . Orthostatic hypotension   . Osteomyelitis of foot, left, acute ([place])   . Other chronic postoperative pain   . Pneumonia    "probably twice" (01/10/2017)  . Polymyalgia rheumatica ([place])   . Renal insufficiency   . Sleep apnea    "lost weight; no more problem" (01/10/2017)  . Stroke ([place]) 01/10/06   denies residual on 05/09/2014  . Type II diabetes mellitus ([place]) dx'd 1995  . Unspecified hereditary and idiopathic peripheral neuropathy    feet  . Unspecified osteomyelitis, site unspecified   . Unspecified vitamin D deficiency     Patient Active Problem List   Diagnosis Date Noted  . Altered mental status   . Cerebral thrombosis with cerebral infarction 02/06/2017  . Cerebral embolism with cerebral infarction 02/06/2017  . Above knee amputation status, left   . Pressure injury of skin 01/30/2017  . Acute lower UTI 01/29/2017  . Acute metabolic encephalopathy 40/98/1191  . UTI (urinary tract infection) 01/29/2017  . Quadriceps muscle rupture, left, initial encounter   . Fall 01/09/2017  . Gait disturbance 01/09/2017  . Staphylococcus  aureus infection 01/09/2017  . Infection of prosthetic left knee joint ([place]) 01/09/2017  . Fever, unknown origin 11/09/2016  . Critical lower limb ischemia 08/25/2016  . Ulcer of left midfoot with fat layer exposed ([place]) 08/13/2016  . Diabetic ulcer of left midfoot associated with type 2 diabetes mellitus, with fat layer exposed ([place]) 07/25/2016  . Peripheral neuropathy 07/22/2016  . Tobacco abuse 07/22/2016  . CAD in native artery 05/18/2016  . CAD, multiple vessel 05/11/2016  . Positive cardiac stress test 05/11/2016  . Abnormal stress test 04/30/2016  . Pre-transplant evaluation for kidney transplant 04/30/2016  . S/P revision of total knee 11/26/2015  .  Pain in the chest   . Acute on chronic diastolic heart failure ([place]) 07/05/2015  . Volume overload 07/04/2015  . Shortness of breath 07/04/2015  . Hypoxemia 07/04/2015  . Elevated troponin   . End-stage renal disease on hemodialysis ([place])   . Hypervolemia   . 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 Pyogenic bacterial arthritis of knee, left ([place]) 08/07/2014  . Tachycardia 07/24/2014  . Acute upper respiratory infection 07/24/2014  . ESRD on dialysis ([place]) 07/14/2014  . Type II diabetes mellitus ([place]) 07/14/2014  . Anemia in chronic kidney disease 07/14/2014  . Congestive heart failure (CHF) ([place]) 07/13/2014  . Surgical wound dehiscence 05/09/2014  . Dehiscence of closure of skin 05/09/2014  . Total knee replacement status 04/10/2014  . Diabetes mellitus with renal manifestations, controlled ([place]) 10/24/2013  . Hypertensive renal disease 06/27/2013  . DM type 2 causing vascular disease ([place]) 06/27/2013  . Erectile dysfunction 06/27/2013  . Depression 06/27/2013  . Claudication of left lower extremity ([place]) 12/19/2012  . Essential hypertension, benign 12/19/2012  . Sinusitis, acute maxillary 11/22/2012  . Otitis, externa, infective 11/14/2012  . Leg edema, left 11/14/2012  . End stage renal disease ([place]) 10/02/2012  . Controlled type 2 DM with proteinuria or microalbuminuria 09/19/2012  . GERD (gastroesophageal reflux disease) 09/19/2012  . Leukocytosis 09/19/2012  . Lacunar infarction ([employer] [facility]) 08/17/2012  . Polymyalgia rheumatica ([place]) 08/17/2012  . 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 Essential hypertension 05/10/2012  . Vitamin D deficiency 05/10/2012  . Diabetes mellitus due to underlying condition ([place]) 05/10/2012  . Hyperlipidemia LDL goal <100 05/10/2012  . Anemia of chronic disease 05/10/2012  . Screening for prostate cancer 05/10/2012  . Chronic kidney disease (CKD), stage IV (severe) ([place]) 05/10/2012  . Peripheral autonomic neuropathy due to DM ([place])  05/10/2012  . Callus of foot 05/10/2012  . Urgency of urination 05/10/2012  . Hyperkalemia 05/10/2012  . Candidiasis of the esophagus 10/12/2011  . Internal hemorrhoids without mention of complication 19/41/7408  . Pre-syncope 07/25/2009  . DJD (degenerative joint disease) of cervical spine 02/17/2009    Past Surgical History:  Procedure Laterality Date  . ABDOMINAL AORTOGRAM N/A 08/25/2016   Procedure: ABDOMINAL AORTOGRAM;  Surgeon: Wellington Hampshire, MD;  Location: [facility];  Service: Cardiovascular;  Laterality: N/A;  . AMPUTATION  01/21/2012   Procedure: AMPUTATION RAY;  Surgeon: [name], MD;  Location: [facility];  Service: Orthopedics;  Laterality: Left;  Left Foot 4th Ray Amputation  . AMPUTATION Left 05/04/2013   Procedure: AMPUTATION DIGIT;  Surgeon: [name], MD;  Location: [facility];  Service: Orthopedics;  Laterality: Left;  Left Great Toe Amputation at [facility]  . AMPUTATION Left 01/14/2017   Procedure: AMPUTATION ABOVE LEFT KNEE;  Surgeon: [name], MD;  Location: [facility];  Service: Orthopedics;  Laterality: Left;  .  ANTERIOR CERVICAL DECOMP/DISCECTOMY FUSION  02/2011  . BACK SURGERY    . BASCILIC VEIN TRANSPOSITION Left 10/19/2012   Procedure: BASCILIC VEIN TRANSPOSITION;  Surgeon: Serafina Mitchell, MD;  Location: [facility];  Service: Vascular;  Laterality: Left;  . CARDIAC CATHETERIZATION     "before bypass"  . CORONARY ARTERY BYPASS GRAFT     x 5 with lima at [place] WITH ANTIBIOTIC SPACERS Left 08/07/2014   Procedure: Replace Left Total Knee Arthroplasty,  Place Antibiotic Spacer;  Surgeon: [name], MD;  Location: [facility];  Service: Orthopedics;  Laterality: Left;  . I&D EXTREMITY Left 05/09/2014   Procedure: Irrigation and Debridement Left Knee and Closure of Total Knee Arthroplasty Incision;  Surgeon: [name], MD;  Location: [facility];  Service: Orthopedics;  Laterality: Left;  . I&D KNEE WITH POLY EXCHANGE Left  05/31/2014   Procedure: IRRIGATION AND DEBRIDEMENT LEFT KNEE, PLACE ANTIBIOTIC BEADS,  POLY EXCHANGE;  Surgeon: [name], MD;  Location: [facility];  Service: Orthopedics;  Laterality: Left;  . IRRIGATION AND DEBRIDEMENT KNEE Left 01/12/2017   Procedure: IRRIGATION AND DEBRIDEMENT LEFT KNEE;  Surgeon: [name], MD;  Location: [facility];  Service: Orthopedics;  Laterality: Left;  . JOINT REPLACEMENT    . KNEE ARTHROSCOPY Left 08-25-2012  . LOWER EXTREMITY ANGIOGRAPHY Left 08/25/2016   Procedure: Lower Extremity Angiography;  Surgeon: Wellington Hampshire, MD;  Location: [facility];  Service: Cardiovascular;  Laterality: Left;  . PERIPHERAL VASCULAR BALLOON ANGIOPLASTY Left 08/25/2016   Procedure: PERIPHERAL VASCULAR BALLOON ANGIOPLASTY;  Surgeon: Wellington Hampshire, MD;  Location: [facility];  Service: Cardiovascular;  Laterality: Left;  lt peroneal and ant tibial arteries cutting balloon  . REFRACTIVE SURGERY Bilateral   . TOE AMPUTATION Bilateral    "I've lost 7 toes over the last 7 years" (05/09/2014)  . TOE SURGERY Left April 2015   Big toe removed on left foot.  . TONSILLECTOMY    . TOTAL KNEE ARTHROPLASTY Left 04/10/2014   Procedure: TOTAL KNEE ARTHROPLASTY;  Surgeon: [name], MD;  Location: [facility];  Service: Orthopedics;  Laterality: Left;  . TOTAL KNEE REVISION Left 10/25/2014   Procedure: LEFT TOTAL KNEE REVISION;  Surgeon: [name], MD;  Location: [facility];  Service: Orthopedics;  Laterality: Left;  . TOTAL KNEE REVISION Left 11/26/2015   Procedure: Removal Left Total Knee Arthroplasty, Hinged Total Knee Arthroplasty;  Surgeon: [name], MD;  Location: [facility];  Service: Orthopedics;  Laterality: Left;  . UVULOPALATOPHARYNGOPLASTY, TONSILLECTOMY AND SEPTOPLASTY  ~ 1989  . WOUND DEBRIDEMENT Left 05/09/2014   Dehiscence Left Total Knee Arthroplasty Incision        Home Medications    Prior to Admission medications   Medication Sig Start Date End Date Taking?  Authorizing Provider  Amino Acids-Protein Hydrolys (FEEDING SUPPLEMENT, PRO-STAT SUGAR FREE 64,) LIQD Take 30 mLs by mouth 2 (two) times daily. Patient not taking: Reported on 11/30/2017 01/19/17   Patrecia Pour, Christean Grief, MD  amLODipine (NORVASC) 5 MG tablet Take 5 mg by mouth daily.     [provider]  aspirin EC 81 MG EC tablet Take 2 tablets (162 mg total) by mouth daily. Patient not taking: Reported on 11/30/2017 01/20/17   Patrecia Pour, Christean Grief, MD  carvedilol (COREG) 12.5 MG tablet Take 1 tablet (12.5 mg total) by mouth 2 (two) times daily with a meal. Patient taking differently: Take 6.25 mg by mouth  2 (two) times daily with a meal.  02/08/17   Dana Allan I, MD  ciprofloxacin (CIPRO) 250 MG tablet Take 250 mg by mouth daily.    [provider]  Darbepoetin Alfa (ARANESP) 150 MCG/0.3ML SOSY injection Inject 0.3 mLs (150 mcg total) into the vein every Friday with hemodialysis. 02/11/17   Bonnell Public, MD  dicyclomine (BENTYL) 10 MG capsule Take 10 mg by mouth daily as needed for spasms.    [provider]  ferric citrate (AURYXIA) 1 GM 210 MG(Fe) tablet Take 2 tablets (420 mg total) by mouth 3 (three) times daily with meals. Patient not taking: Reported on 11/30/2017 02/08/17   Dana Allan I, MD  gabapentin (NEURONTIN) 300 MG capsule TAKE 1 CAPSULE BY MOUTH THREE TIMES A DAY Patient taking differently: Take 300 mg by mouth 3 (three) times daily.  10/24/17   [name], MD  metroNIDAZOLE (FLAGYL) 500 MG tablet Take 500 mg by mouth 3 (three) times daily.    [provider]  montelukast (SINGULAIR) 10 MG tablet TAKE 1 TABLET (10 MG TOTAL) BY MOUTH AT BEDTIME. Patient taking differently: Take 10 mg by mouth at bedtime.  09/01/15   Gildardo Cranker, DO  multivitamin (RENA-VIT) TABS tablet Take 1 tablet by mouth at bedtime. 02/08/17   Bonnell Public, MD  Nutritional Supplements (FEEDING SUPPLEMENT, NEPRO CARB STEADY,) LIQD Take 237 mLs by mouth 2  (two) times daily between meals. Patient not taking: Reported on 11/30/2017 01/19/17   Patrecia Pour, Christean Grief, MD  ondansetron (ZOFRAN ODT) 4 MG disintegrating tablet Take 1 tablet (4 mg total) by mouth every 8 (eight) hours as needed for nausea or vomiting. 12/16/17   Kinnie Feil, PA-C  pentoxifylline (TRENTAL) 400 MG CR tablet TAKE 1 TABLET (400 MG TOTAL) BY MOUTH 3 (THREE) TIMES DAILY WITH MEALS. 01/17/18   [name], MD  sevelamer carbonate (RENVELA) 800 MG tablet Take 800-2,400 mg by mouth 3 (three) times daily with meals.    [provider]  temazepam (RESTORIL) 15 MG capsule Take 30 mg by mouth at bedtime as needed for sleep.  02/28/17   [provider]  traMADol (ULTRAM) 50 MG tablet Take 1 tablet (50 mg total) by mouth every 6 (six) hours as needed. 12/16/17   Kinnie Feil, PA-C    Family History Family History  Problem Relation Age of Onset  . Hypertension Mother   . Cancer Mother 22       Ovarian  . Heart disease Maternal Aunt   . Stroke Maternal Grandfather     Social History Social History   Tobacco Use  . Smoking status: Former Smoker    Packs/day: 0.12    Years: 32.00    Pack years: 3.84    Types: Cigarettes    Last attempt to quit: 10/05/2016    Years since quitting: 1.3  . Smokeless tobacco: Never Used  Substance Use Topics  . Alcohol use: No    Alcohol/week: 0.0 standard drinks  . Drug use: No     Allergies   Morphine and related and Tygacil [tigecycline]   Review of Systems Review of Systems  Constitutional: Negative for chills and fever.  HENT: Negative for rhinorrhea and sore throat.   Eyes: Negative for visual disturbance.  Respiratory: Negative for cough and shortness of breath.   Cardiovascular: Negative for chest pain and leg swelling.  Gastrointestinal: Positive for abdominal pain and nausea. Negative for diarrhea and vomiting.  Genitourinary: Positive for testicular  pain. Negative for penile swelling and scrotal  swelling.  Musculoskeletal: Negative for joint swelling and neck pain.  Skin: Negative for rash and wound.  Neurological: Negative for syncope and numbness.  All other systems reviewed and are negative.    Physical Exam Updated Vital Signs BP (!) 148/104   Pulse 84   Temp 98.7 F (37.1 C) (Oral)   Resp 13   Ht 5' 11.5" (1.816 m)   Wt 85.7 kg   SpO2 100%   BMI 25.99 kg/m   Physical Exam Vitals signs and nursing note reviewed.  Constitutional:      Appearance: He is well-developed.  HENT:     Head: Normocephalic and atraumatic.  Eyes:     Conjunctiva/sclera: Conjunctivae normal.  Neck:     Musculoskeletal: Neck supple.  Cardiovascular:     Rate and Rhythm: Normal rate and regular rhythm.     Heart sounds: Normal heart sounds. No murmur.  Pulmonary:     Effort: Pulmonary effort is normal. No respiratory distress.     Breath sounds: Normal breath sounds. No wheezing or rales.  Abdominal:     General: Bowel sounds are normal. There is no distension.     Palpations: Abdomen is soft.     Tenderness: There is generalized abdominal tenderness. There is no guarding or rebound.     Hernia: A hernia is present. Hernia is present in the right inguinal area. There is no hernia in the left inguinal area.     Comments: Right inguinal hernia tender to palpation, hard, unable to reduce.  Musculoskeletal: Normal range of motion.        General: No tenderness or deformity.  Feet:     Left foot:     Amputation: Left leg is amputated above knee.  Skin:    General: Skin is warm and dry.     Findings: No erythema or rash.  Neurological:     Mental Status: He is alert and oriented to person, place, and time.  Psychiatric:        Behavior: Behavior normal.      ED Treatments / Results  Labs (all labs ordered are listed, but only abnormal results are displayed) Labs Reviewed  CBC WITH DIFFERENTIAL/PLATELET - Abnormal; Notable for the following components:      Result Value   RBC  3.34 (*)    Hemoglobin 10.4 (*)    HCT 33.4 (*)    All other components within normal limits  BASIC METABOLIC PANEL - Abnormal; Notable for the following components:   Glucose, Bld 111 (*)    Creatinine, Ser 5.24 (*)    Calcium 8.4 (*)    GFR calc non Af Amer 11 (*)    GFR calc Af Amer 13 (*)    All other components within normal limits    EKG None  Radiology Dg Abdomen Acute W/chest  Result Date: 02/15/2018 CLINICAL DATA:  Right lower quadrant and left lower quadrant abdominal pain very intense for 8 hours. Patient almost always has abdominal pain. Nausea. EXAM: DG ABDOMEN ACUTE W/ 1V CHEST COMPARISON:  CT abdomen and pelvis 01/02/2018. Chest 01/02/2018. FINDINGS: Postoperative changes in the mediastinum. Postoperative changes in the cervical spine. Cardiac enlargement. No vascular congestion, edema, or consolidation. No blunting of costophrenic angles. No pneumothorax. Calcification of the aorta. Degenerative changes in the spine. Scattered gas and stool throughout the colon. No small or large bowel distention. No free intra-abdominal air. No abnormal air-fluid levels. Prominent aortic and mesenteric vascular  calcifications. Calcified phleboliths. No radiopaque stones. Degenerative changes in the spine and hips. IMPRESSION: Cardiac enlargement. No evidence of active pulmonary disease. Nonobstructive bowel gas pattern. Electronically Signed   By: Lucienne Capers M.D.   On: 02/15/2018 19:33    Procedures Procedures (including critical care time)  Medications Ordered in ED Medications  HYDROmorphone (DILAUDID) injection 1 mg (1 mg Intravenous Given 02/15/18 1818)  HYDROmorphone (DILAUDID) injection 0.5 mg (0.5 mg Intravenous Given 02/15/18 1900)     Initial Impression / Assessment and Plan / ED Course  I have reviewed the triage vital signs and the nursing notes.  Pertinent labs & imaging results that were available during my care of the patient were reviewed by me and considered in my  medical decision making (see chart for details).  Clinical Course as of Feb 16 2008  Wed Feb 15, 2018  1849 Ice in place on right groin. Pt did not tolerate initial attempt at reduction after 1mg  of dilaudid. Will try another 0.5mg  of dilaudid and attempt reduction again   [CK]    Clinical Course User Index [CK] Cyris Maalouf S, PA-C    After second dose of pain medicine and ice, the hernia was much softer to palpation.  It was reduced by attending, Dr. Alvino Chapel.   8:11 PM Patient reevaluated.  His right groin is soft and nontender palpation.  No palpable hernia.    Patient presented with right groin pain which is acute since 11 AM.  He was noted to have a hernia which was unable to be reduced initially.  However it was successfully reduced by the attending.  He had a CT scan yesterday for his chronic abdominal pain which showed no acute findings.  He had blood work today which is stable compared to prior.  He is now resting comfortably in bed, no acute distress, nontoxic, non-lethargic.  Vital signs stable.  Patient instructed to follow-up with general surgery outpatient.  He was also instructed to buy a binder to help reduce the risk of hernia returning.  He expressed understanding.  He was given strict return precautions.  Patient stable for discharge.   At this time there does not appear to be any evidence of an acute emergency medical condition and the patient appears stable for discharge with appropriate outpatient follow up.Diagnosis was discussed with patient who verbalizes understanding and is agreeable to discharge. Pt case discussed with Dr. Alvino Chapel who agrees with my plan.   Final Clinical Impressions(s) / ED Diagnoses   Final diagnoses:  None    ED Discharge Orders    None       Rachel Moulds 02/16/18 0026    Davonna Belling, MD 02/16/18 [phone]

## 2018-02-15 NOTE — ED Triage Notes (Signed)
Pt BIB [facility]. Pt was at dialysis today when he started to have worsening abdominal pain. Pt reports that he has recently had a work up for diverticulitis but has not gotten the results. Pt states the pain is across his lower abdomen and radiates into his right groin where he does have a hernia. Pt reports some nausea. Pt complains of 10/10 pain. Pt did complete dialysis today.

## 2018-02-15 NOTE — ED Notes (Signed)
Pt transported to xray 

## 2018-02-15 NOTE — Discharge Instructions (Signed)
Follow-up with general surgery within a week for continued evaluation of your inguinal hernia and for an elective hernia repair.  In the meantime do not strain, lift heavy objects.  Please get a binder from a medical supply store to help reduce the risk of your hernia returning.  Return to the ED immediately for new or worsening symptoms, your hernia returning and being unable to be reduced, new or worsening pain, vomiting, constipation, fevers or any concerns at all.

## 2018-02-16 ENCOUNTER — Ambulatory Visit: Payer: Medicare HMO | Admitting: Physical Therapy

## 2018-02-21 ENCOUNTER — Ambulatory Visit: Payer: Medicare HMO | Admitting: Physical Therapy

## 2018-02-21 ENCOUNTER — Encounter: Payer: Self-pay | Admitting: Physical Therapy

## 2018-02-21 ENCOUNTER — Other Ambulatory Visit (INDEPENDENT_AMBULATORY_CARE_PROVIDER_SITE_OTHER): Payer: Self-pay | Admitting: Orthopedic Surgery

## 2018-02-21 DIAGNOSIS — M6281 Muscle weakness (generalized): Secondary | ICD-10-CM

## 2018-02-21 DIAGNOSIS — R2689 Other abnormalities of gait and mobility: Secondary | ICD-10-CM | POA: Diagnosis not present

## 2018-02-21 DIAGNOSIS — Z9181 History of falling: Secondary | ICD-10-CM

## 2018-02-21 DIAGNOSIS — R2681 Unsteadiness on feet: Secondary | ICD-10-CM | POA: Diagnosis present

## 2018-02-21 DIAGNOSIS — R293 Abnormal posture: Secondary | ICD-10-CM | POA: Diagnosis present

## 2018-02-21 NOTE — Therapy (Signed)
[facility] [address] [place], [place], [address] Phone: [phone]   Fax:  [phone]  Physical Therapy Treatment  Patient Details  Name: Matthew Kline MRN: [id number] Date of Birth: February 05, 1960 Referring Provider (PT): Meridee Score, MD   Encounter Date: 02/21/2018  PT End of Session - 02/21/18 0927    Visit Number  36    Number of Visits  60    Date for PT Re-Evaluation  04/17/18    Authorization Type  [employer]    Authorization Time Period       PT Start Time  0845    PT Stop Time  0925    PT Time Calculation (min)  40 min    Equipment Utilized During Treatment  Gait belt    Activity Tolerance  Patient tolerated treatment well;No increased pain    Behavior During Therapy  WFL for tasks assessed/performed       Past Medical History:  Diagnosis Date  . Anemia, unspecified   . Anxiety   . Arthralgia 2010   polyarticular  . Arthritis    "back, knees" (01/10/2017)  . Cancer [facility])    "kidney area" (01/10/2017)  . CHF (congestive heart failure) ([place]) 07/25/2009   denies  . Chronic lower back pain   . Coronary artery disease   . Coughing    pt. reports that he has drainage from sinus infection  . Diabetic foot ulcer ([place])   . Diabetic neuropathy ([place])   . ESRD (end stage renal disease) on dialysis [facility])    started 12/2012; "MWF; [place] "  (01/10/2017)  . GERD (gastroesophageal reflux disease)    hx "before I lost weight", no problem 9 years  . Hemodialysis access site with mature fistula ([place])   . Hemorrhoids, internal 10/2011   small  . High cholesterol   . History of blood transfusion    "related to the anemia"  . Hypertension   . Insomnia, unspecified   . Lacunar infarction ([place]) 2006   RUE/RLE, speech  . Long term (current) use of anticoagulants   . Myocardial infarction ([place]) 1995  . Orthostatic hypotension   . Osteomyelitis of foot, left, acute ([place])   . Other chronic  postoperative pain   . Pneumonia    "probably twice" (01/10/2017)  . Polymyalgia rheumatica ([place])   . Renal insufficiency   . Sleep apnea    "lost weight; no more problem" (01/10/2017)  . Stroke ([place]) 01/10/06   denies residual on 05/09/2014  . Type II diabetes mellitus ([place]) dx'd 1995  . Unspecified hereditary and idiopathic peripheral neuropathy    feet  . Unspecified osteomyelitis, site unspecified   . Unspecified vitamin D deficiency     Past Surgical History:  Procedure Laterality Date  . ABDOMINAL AORTOGRAM N/A 08/25/2016   Procedure: ABDOMINAL AORTOGRAM;  Surgeon: Wellington Hampshire, MD;  Location: [place] [facility];  Service: Cardiovascular;  Laterality: N/A;  . AMPUTATION  01/21/2012   Procedure: AMPUTATION RAY;  Surgeon: [name], MD;  Location: [facility];  Service: Orthopedics;  Laterality: Left;  Left Foot 4th Ray Amputation  . AMPUTATION Left 05/04/2013   Procedure: AMPUTATION DIGIT;  Surgeon: [name], MD;  Location: [place];  Service: Orthopedics;  Laterality: Left;  Left Great Toe Amputation at [facility]  . AMPUTATION Left 01/14/2017   Procedure: AMPUTATION ABOVE LEFT KNEE;  Surgeon: [name], MD;  Location: [facility];  Service: Orthopedics;  Laterality: Left;  .  ANTERIOR CERVICAL DECOMP/DISCECTOMY FUSION  02/2011  . BACK SURGERY    . BASCILIC VEIN TRANSPOSITION Left 10/19/2012   Procedure: BASCILIC VEIN TRANSPOSITION;  Surgeon: Serafina Mitchell, MD;  Location: [facility];  Service: Vascular;  Laterality: Left;  . CARDIAC CATHETERIZATION     "before bypass"  . CORONARY ARTERY BYPASS GRAFT     x 5 with lima at [place] WITH ANTIBIOTIC SPACERS Left 08/07/2014   Procedure: Replace Left Total Knee Arthroplasty,  Place Antibiotic Spacer;  Surgeon: [name], MD;  Location: [place];  Service: Orthopedics;  Laterality: Left;  . I&D EXTREMITY Left 05/09/2014   Procedure: Irrigation and Debridement Left Knee and Closure of Total Knee  Arthroplasty Incision;  Surgeon: [name], MD;  Location: [facility] [place];  Service: Orthopedics;  Laterality: Left;  . I&D KNEE WITH POLY EXCHANGE Left 05/31/2014   Procedure: IRRIGATION AND DEBRIDEMENT LEFT KNEE, PLACE ANTIBIOTIC BEADS,  POLY EXCHANGE;  Surgeon: [name], MD;  Location: [place];  Service: Orthopedics;  Laterality: Left;  . IRRIGATION AND DEBRIDEMENT KNEE Left 01/12/2017   Procedure: IRRIGATION AND DEBRIDEMENT LEFT KNEE;  Surgeon: [name], MD;  Location: [facility];  Service: Orthopedics;  Laterality: Left;  . JOINT REPLACEMENT    . KNEE ARTHROSCOPY Left 08-25-2012  . LOWER EXTREMITY ANGIOGRAPHY Left 08/25/2016   Procedure: Lower Extremity Angiography;  Surgeon: Wellington Hampshire, MD;  Location: [place] [facility];  Service: Cardiovascular;  Laterality: Left;  . PERIPHERAL VASCULAR BALLOON ANGIOPLASTY Left 08/25/2016   Procedure: PERIPHERAL VASCULAR BALLOON ANGIOPLASTY;  Surgeon: Wellington Hampshire, MD;  Location: [place] [facility];  Service: Cardiovascular;  Laterality: Left;  lt peroneal and ant tibial arteries cutting balloon  . REFRACTIVE SURGERY Bilateral   . TOE AMPUTATION Bilateral    "I've lost 7 toes over the last 7 years" (05/09/2014)  . TOE SURGERY Left April 2015   Big toe removed on left foot.  . TONSILLECTOMY    . TOTAL KNEE ARTHROPLASTY Left 04/10/2014   Procedure: TOTAL KNEE ARTHROPLASTY;  Surgeon: [name], MD;  Location: [place];  Service: Orthopedics;  Laterality: Left;  . TOTAL KNEE REVISION Left 10/25/2014   Procedure: LEFT TOTAL KNEE REVISION;  Surgeon: [name], MD;  Location: [facility];  Service: Orthopedics;  Laterality: Left;  . TOTAL KNEE REVISION Left 11/26/2015   Procedure: Removal Left Total Knee Arthroplasty, Hinged Total Knee Arthroplasty;  Surgeon: [name], MD;  Location: [place];  Service: Orthopedics;  Laterality: Left;  . UVULOPALATOPHARYNGOPLASTY, TONSILLECTOMY AND SEPTOPLASTY  ~ 1989  . WOUND DEBRIDEMENT Left 05/09/2014   Dehiscence  Left Total Knee Arthroplasty Incision    There were no vitals filed for this visit.  Subjective Assessment - 02/21/18 0850    Subjective  He was at ED on 02/15/2018 and had right inguinal hernia reduced. He has to have laproscopic surgery to repair probably in 3 areas. CT scan showed calcuim deposits in heart and he has to have cardiologist clearance first.     Pertinent History  Left TFA, ESRD with dialysis M,W,F, CHF, CAD s/p CABG 05/2016, PAD, CVA, DM2, neuropathy, arthritis, right toe amputations    Limitations  Lifting;Standing;Walking;House hold activities    Patient Stated Goals  He wants to use prosthesis to walk without assistance, garden, cook / work in [employer]     Currently in Pain?  Yes    Pain Score  7  Pain Location  Groin    Pain Orientation  Right    Pain Descriptors / Indicators  Stabbing    Pain Type  Acute pain    Pain Onset  1 to 4 weeks ago    Pain Frequency  Constant    Aggravating Factors   hernia    Pain Relieving Factors  ice & tylenol                       [facility] Adult PT Treatment/Exercise - 02/21/18 0845      Transfers   Transfers  Sit to Stand;Stand to Sit    Sit to Stand  5: Supervision;With upper extremity assist;With armrests;From chair/3-in-1   now can stabilize without UE support   Sit to Stand Details  Visual cues/gestures for sequencing;Verbal cues for sequencing;Verbal cues for technique    Stand to Sit  5: Supervision;With upper extremity assist;With armrests;To chair/3-in-1    Stand to Sit Details (indicate cue type and reason)  Visual cues/gestures for sequencing;Verbal cues for sequencing;Verbal cues for technique      Ambulation/Gait   Ambulation/Gait  Yes    Ambulation/Gait Assistance  5: Supervision;4: Min guard    Ambulation/Gait Assistance Details  verbal cues on upright posture    Ambulation Distance (Feet)  300 Feet   200' indoors & 300' outdoors   Assistive device  Prosthesis;Straight cane;R Forearm Crutch    Gait  Pattern  Step-through pattern;Decreased arm swing - left;Decreased step length - right;Decreased stance time - left;Decreased hip/knee flexion - left;Decreased weight shift to left;Left hip hike;Left circumduction;Antalgic;Lateral hip instability;Trunk rotated posteriorly on left;Abducted - left    Ambulation Surface  Indoor;Level;Outdoor;Paved    Ramp  4: Min assist   min guard with cane & prosthesis   Ramp Details (indicate cue type and reason)  verbal & tactile cues on upright psoture & wt shift    Curb  4: Min assist   Min guard, cane & prosthesis   Curb Details (indicate cue type and reason)  verbal & tactile cues on upright psoture & wt shift      High Level Balance   High Level Balance Activities  --    High Level Balance Comments  --      Neuro Re-ed    Neuro Re-ed Details   standing with prosthetic toe ~1" posterior to sound toes to facilitate prosthetic knee maintaining extension with minimal effort      Prosthetics   Prosthetic Care Comments   --    Current prosthetic wear tolerance (days/week)   daily    Current prosthetic wear tolerance (#hours/day)   reports all awake hours with a break somedays after dialysis for napping    Edema  fluctuating due to dialysis    Residual limb condition   no open areas or rash,     Education Provided  --             PT Education - 02/21/18 0910    Education Details  cervical retraction & scapular retraction/depression closed chain in supine & sitting    Person(s) Educated  Patient    Methods  Explanation;Demonstration;Tactile cues;Verbal cues    Comprehension  Verbalized understanding;Returned demonstration       PT Short Term Goals - 02/21/18 0845      PT SHORT TERM GOAL #1   Title  Patient ambulates 300' cane & prosthesis scanning environment with supervision. (All STGs Target Date: 02/16/2018)    Baseline  MET  02/21/2018    Time  1    Period  Months    Status  Achieved    Target Date  02/16/18      PT SHORT TERM GOAL #2    Title  Patient negotiates ramps & curbs with cane & prosthesis with minA/ guard.    Baseline  MET 02/21/2018    Time  1    Period  Months    Status  Achieved    Target Date  02/16/18      PT SHORT TERM GOAL #3   Title  Patient descends stairs with 2 rails & prosthesis alternate pattern with supervision & 1 rail/cane with minA.     Baseline  MET 02/21/2018    Time  1    Period  Months    Status  Achieved    Target Date  02/16/18      PT SHORT TERM GOAL #4   Title  Patient lifts 10# crate from floor engaging prosthesis with supervision.     Baseline       Time  1    Period  Months    Status  On-going    Target Date  03/17/18      PT SHORT TERM GOAL #5   Title  Patient ambulates 100' with prosthesis only carrying plate with minimal guard.    Baseline       Time  1    Period  Months    Status  On-going    Target Date  03/17/18        PT Short Term Goals - 02/21/18 2254      PT SHORT TERM GOAL #1   Title  Patient ambulates 500' with cane & prosthesis outdoors on paved surfaces. (All STGs Target Date: 03/17/2018)    Time  1    Period  Months    Status  Revised    Target Date  03/17/18      PT SHORT TERM GOAL #2   Title  Patient negotiates ramps & curbs with cane & prosthesis with supervision    Time  1    Period  Months    Status  Revised    Target Date  03/17/18      PT SHORT TERM GOAL #3   Title  Patient descends stairs with 2 rails & prosthesis alternate pattern with supervision & 1 rail/cane with supervision     Time  1    Period  Months    Status  Revised    Target Date  03/17/18      PT SHORT TERM GOAL #4   Title  Patient lifts 10# crate from floor engaging prosthesis with supervision.     Time  1    Period  Months    Status  On-going    Target Date  03/17/18      PT SHORT TERM GOAL #5   Title  Patient ambulates 100' with prosthesis only carrying plate with minimal guard.    Time  1    Period  Months    Status  On-going    Target Date  03/17/18          PT Long Term Goals - 02/02/18 2324      PT LONG TERM GOAL #1   Title  Patient verbalizes proper prosthetic care including problem solving issues like socket fit with weight changes, wearing >90% of awake hours without skin issues and when to contact prosthetist. (All updated LTGs  Target Date: 04/14/2018)    Time  3    Period  Months    Status  On-going    Target Date  04/14/18      PT LONG TERM GOAL #2   Title  Dynamic Gait Index with cane or less and prosthesis >/= 14/24 to indicate lower fall risk.     Time  3    Period  Months    Status  On-going    Target Date  04/14/18      PT LONG TERM GOAL #3   Title  Berg Balance with device >/= 52/56 to indicate lower fall risk.     Time  3    Period  Months    Status  On-going    Target Date  04/14/18      PT LONG TERM GOAL #4   Title  Patient ambulates 1000' outdoors including grass with cane or less and prosthesis modified independent.     Time  3    Period  Months    Status  On-going    Target Date  04/14/18      PT LONG TERM GOAL #5   Title  patient negotiates stairs with single rail, ramps & curbs with cane or less and prosthesis modified independent.     Time  3    Period  Months    Status  On-going    Target Date  04/14/18      PT LONG TERM GOAL #6   Title  Patient ambulates around furniture with prosthesis only carrying light weight household items modified independent.     Time  3    Period  Months    Status  On-going    Target Date  04/14/18            Plan - 02/21/18 2244    Clinical Impression Statement  Patient met 3 of STGs. He was limited by abdominal pain with hernia. PT instructed in cervical retraction & scapular retraction/depression.     Rehab Potential  Good    PT Frequency  2x / week    PT Duration  Other (comment)   13 weeks (90 days) / 3 months   PT Treatment/Interventions  ADLs/Self Care Home Management;DME Instruction;Gait training;Stair training;Functional mobility  training;Therapeutic activities;Therapeutic exercise;Balance training;Neuromuscular re-education;Canalith Repostioning;Patient/family education;Prosthetic Training;Vestibular    PT Next Visit Plan  work towards updated STGs, community based prosthetic gait activities with cane and household without device, balance activities    Consulted and Agree with Plan of Care  Patient       Patient will benefit from skilled therapeutic intervention in order to improve the following deficits and impairments:  Decreased activity tolerance, Abnormal gait, Decreased balance, Decreased endurance, Decreased knowledge of use of DME, Decreased mobility, Decreased strength, Prosthetic Dependency, Postural dysfunction, Dizziness  Visit Diagnosis: Other abnormalities of gait and mobility  Muscle weakness (generalized)  Unsteadiness on feet  Abnormal posture  History of falling     Problem List Patient Active Problem List   Diagnosis Date Noted  . Altered mental status   . Cerebral thrombosis with cerebral infarction 02/06/2017  . Cerebral embolism with cerebral infarction 02/06/2017  . Above knee amputation status, left   . Pressure injury of skin 01/30/2017  . Acute lower UTI 01/29/2017  . Acute metabolic encephalopathy 80/03/4915  . UTI (urinary tract infection) 01/29/2017  . Quadriceps muscle rupture, left, initial encounter   . Fall 01/09/2017  . Gait disturbance 01/09/2017  . Staphylococcus aureus  infection 01/09/2017  . Infection of prosthetic left knee joint ([place]) 01/09/2017  . Fever, unknown origin 11/09/2016  . Critical lower limb ischemia 08/25/2016  . Ulcer of left midfoot with fat layer exposed ([place]) 08/13/2016  . Diabetic ulcer of left midfoot associated with type 2 diabetes mellitus, with fat layer exposed ([place]) 07/25/2016  . Peripheral neuropathy 07/22/2016  . Tobacco abuse 07/22/2016  . CAD in native artery 05/18/2016  . CAD, multiple vessel 05/11/2016  . Positive cardiac  stress test 05/11/2016  . Abnormal stress test 04/30/2016  . Pre-transplant evaluation for kidney transplant 04/30/2016  . S/P revision of total knee 11/26/2015  . Pain in the chest   . Acute on chronic diastolic heart failure ([place]) 07/05/2015  . Volume overload 07/04/2015  . Shortness of breath 07/04/2015  . Hypoxemia 07/04/2015  . Elevated troponin   . End-stage renal disease on hemodialysis ([place])   . Hypervolemia   . 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 Pyogenic bacterial arthritis of knee, left ([place]) 08/07/2014  . Tachycardia 07/24/2014  . Acute upper respiratory infection 07/24/2014  . ESRD on dialysis ([place]) 07/14/2014  . Type II diabetes mellitus ([place]) 07/14/2014  . Anemia in chronic kidney disease 07/14/2014  . Congestive heart failure (CHF) ([place]) 07/13/2014  . Surgical wound dehiscence 05/09/2014  . Dehiscence of closure of skin 05/09/2014  . Total knee replacement status 04/10/2014  . Diabetes mellitus with renal manifestations, controlled ([place]) 10/24/2013  . Hypertensive renal disease 06/27/2013  . DM type 2 causing vascular disease ([place]) 06/27/2013  . Erectile dysfunction 06/27/2013  . Depression 06/27/2013  . Claudication of left lower extremity ([place]) 12/19/2012  . Essential hypertension, benign 12/19/2012  . Sinusitis, acute maxillary 11/22/2012  . Otitis, externa, infective 11/14/2012  . Leg edema, left 11/14/2012  . End stage renal disease ([place]) 10/02/2012  . Controlled type 2 DM with proteinuria or microalbuminuria 09/19/2012  . GERD (gastroesophageal reflux disease) 09/19/2012  . Leukocytosis 09/19/2012  . Lacunar infarction ([place]) 08/17/2012  . Polymyalgia rheumatica ([place]) 08/17/2012  . 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 Essential hypertension 05/10/2012  . Vitamin D deficiency 05/10/2012  . Diabetes mellitus due to underlying condition ([place]) 05/10/2012  . Hyperlipidemia LDL goal <100 05/10/2012  . Anemia of chronic disease 05/10/2012  .  Screening for prostate cancer 05/10/2012  . Chronic kidney disease (CKD), stage IV (severe) ([place]) 05/10/2012  . Peripheral autonomic neuropathy due to DM ([place]) 05/10/2012  . Callus of foot 05/10/2012  . Urgency of urination 05/10/2012  . Hyperkalemia 05/10/2012  . Candidiasis of the esophagus 10/12/2011  . Internal hemorrhoids without mention of complication 28/20/8138  . Pre-syncope 07/25/2009  . DJD (degenerative joint disease) of cervical spine 02/17/2009    [facility] 02/21/2018, 10:53 PM  [facility] [address] [place], [place], [address] Phone: [phone]   Fax:  [phone]  Name: Matthew Kline MRN: [id number] Date of Birth: 27-Oct-1960

## 2018-02-23 ENCOUNTER — Ambulatory Visit: Payer: Medicare HMO | Admitting: Physical Therapy

## 2018-02-28 ENCOUNTER — Ambulatory Visit: Payer: Medicare HMO | Admitting: Physical Therapy

## 2018-03-02 ENCOUNTER — Ambulatory Visit: Payer: Medicare HMO | Admitting: Physical Therapy

## 2018-03-07 ENCOUNTER — Ambulatory Visit: Payer: Medicare HMO | Admitting: Rehabilitation

## 2018-03-07 DIAGNOSIS — R2689 Other abnormalities of gait and mobility: Secondary | ICD-10-CM

## 2018-03-07 NOTE — Therapy (Addendum)
[facility] [address] [place], [place], [address] Phone: [phone]   Fax:  [phone]  Patient Details  Name: Matthew Kline MRN: [id number] Date of Birth: May 31, 1960 Referring Provider:  Sandi Mariscal, MD  Encounter Date: 03/07/2018  Pt arrived for visit.  Note that he is still having significant pain from inguinal hernia.  He has an appt Thursday for a nuclear study, however will not follow back up with cardiology until next week.  Primary PT Matthew Kline recommended to D/C pt at this time and have him get new referral and to return when medical issues have been resolved.    FYI:  Note that pt has Taylor 3 Prosthetic Knee  The [place] is a microprocessor controlled swing and stance knee prosthesis with variable cadence. It has a programmable stance yield setting for secure stance control on stairs, ramps, standing still and during active gait. The Smart Technology provides self-calibration and core parameters.    Cameron Sprang, PT, MPT [facility] [address] [place], [place], [address] Phone: [phone]   Fax:  [phone] 03/07/18, 9:09 AM     PHYSICAL THERAPY DISCHARGE SUMMARY  Visits from Start of Care: 36  Current functional level related to goals / functional outcomes: Patient did not meet LTGs due to change in medical condition. Patient appears would benefit from stopping PT until he is medically stable.  PT recommending referral back to PT once stable. PT Long Term Goals - 03/09/18 1741      PT LONG TERM GOAL #1   Title  Patient verbalizes proper prosthetic care including problem solving issues like socket fit with weight changes, wearing >90% of awake hours without skin issues and when to contact prosthetist. (All updated LTGs Target Date: 04/14/2018)    Time  3    Period  Months    Status  Not Met      PT LONG TERM GOAL #2   Title  Dynamic Gait Index with  cane or less and prosthesis >/= 14/24 to indicate lower fall risk.     Time  3    Period  Months    Status  Not Met      PT LONG TERM GOAL #3   Title  Berg Balance with device >/= 52/56 to indicate lower fall risk.     Time  3    Period  Months    Status  Not Met      PT LONG TERM GOAL #4   Title  Patient ambulates 1000' outdoors including grass with cane or less and prosthesis modified independent.     Time  3    Period  Months    Status  Not Met      PT LONG TERM GOAL #5   Title  patient negotiates stairs with single rail, ramps & curbs with cane or less and prosthesis modified independent.     Time  3    Period  Months    Status  Not Met      PT LONG TERM GOAL #6   Title  Patient ambulates around furniture with prosthesis only carrying light weight household items modified independent.     Time  3    Period  Months    Status  Not Met        Remaining deficits: Patient should use bilateral crutches with prosthesis to minimize fall risk.    Education / Equipment: Prosthetic care Plan: Patient agrees  to discharge.  Patient goals were not met. Patient is being discharged due to a change in medical status.  ?????         Matthew Kline, PT, DPT PT Specializing in [place] 03/09/18 5:43 PM Phone:  [phone]  Fax:  [phone] [facility] [address] [place],  [address]

## 2018-03-09 ENCOUNTER — Ambulatory Visit: Payer: Medicare HMO | Admitting: Physical Therapy

## 2018-03-14 ENCOUNTER — Encounter: Payer: Medicare HMO | Admitting: Physical Therapy

## 2018-03-16 ENCOUNTER — Encounter: Payer: Medicare HMO | Admitting: Physical Therapy

## 2018-06-14 ENCOUNTER — Ambulatory Visit: Payer: Medicare HMO | Admitting: Family

## 2018-06-20 ENCOUNTER — Ambulatory Visit: Payer: Medicare HMO | Admitting: Orthopedic Surgery

## 2018-07-09 ENCOUNTER — Emergency Department (HOSPITAL_COMMUNITY): Payer: Medicare Other

## 2018-07-09 ENCOUNTER — Other Ambulatory Visit: Payer: Self-pay

## 2018-07-09 ENCOUNTER — Inpatient Hospital Stay (HOSPITAL_COMMUNITY)
Admission: EM | Admit: 2018-07-09 | Discharge: 2018-07-12 | DRG: 640 | Disposition: A | Discharge status: Home / Self Care | Payer: Medicare Other | Attending: Internal Medicine | Admitting: Internal Medicine

## 2018-07-09 ENCOUNTER — Encounter (HOSPITAL_COMMUNITY): Payer: Self-pay | Admitting: Emergency Medicine

## 2018-07-09 DIAGNOSIS — E785 Hyperlipidemia, unspecified: Secondary | ICD-10-CM | POA: Diagnosis not present

## 2018-07-09 DIAGNOSIS — E8770 Fluid overload, unspecified: Secondary | ICD-10-CM | POA: Diagnosis not present

## 2018-07-09 DIAGNOSIS — K219 Gastro-esophageal reflux disease without esophagitis: Secondary | ICD-10-CM | POA: Diagnosis present

## 2018-07-09 DIAGNOSIS — J9601 Acute respiratory failure with hypoxia: Secondary | ICD-10-CM

## 2018-07-09 DIAGNOSIS — Z981 Arthrodesis status: Secondary | ICD-10-CM

## 2018-07-09 DIAGNOSIS — I132 Hypertensive heart and chronic kidney disease with heart failure and with stage 5 chronic kidney disease, or end stage renal disease: Secondary | ICD-10-CM | POA: Diagnosis not present

## 2018-07-09 DIAGNOSIS — Z8249 Family history of ischemic heart disease and other diseases of the circulatory system: Secondary | ICD-10-CM

## 2018-07-09 DIAGNOSIS — Z79891 Long term (current) use of opiate analgesic: Secondary | ICD-10-CM

## 2018-07-09 DIAGNOSIS — E8779 Other fluid overload: Secondary | ICD-10-CM

## 2018-07-09 DIAGNOSIS — K8020 Calculus of gallbladder without cholecystitis without obstruction: Secondary | ICD-10-CM | POA: Diagnosis present

## 2018-07-09 DIAGNOSIS — D696 Thrombocytopenia, unspecified: Secondary | ICD-10-CM | POA: Diagnosis present

## 2018-07-09 DIAGNOSIS — E1151 Type 2 diabetes mellitus with diabetic peripheral angiopathy without gangrene: Secondary | ICD-10-CM | POA: Diagnosis not present

## 2018-07-09 DIAGNOSIS — L739 Follicular disorder, unspecified: Secondary | ICD-10-CM | POA: Diagnosis present

## 2018-07-09 DIAGNOSIS — Z87891 Personal history of nicotine dependence: Secondary | ICD-10-CM

## 2018-07-09 DIAGNOSIS — E1140 Type 2 diabetes mellitus with diabetic neuropathy, unspecified: Secondary | ICD-10-CM | POA: Diagnosis present

## 2018-07-09 DIAGNOSIS — M353 Polymyalgia rheumatica: Secondary | ICD-10-CM | POA: Diagnosis not present

## 2018-07-09 DIAGNOSIS — R188 Other ascites: Secondary | ICD-10-CM | POA: Diagnosis present

## 2018-07-09 DIAGNOSIS — E119 Type 2 diabetes mellitus without complications: Secondary | ICD-10-CM

## 2018-07-09 DIAGNOSIS — Z951 Presence of aortocoronary bypass graft: Secondary | ICD-10-CM

## 2018-07-09 DIAGNOSIS — E1159 Type 2 diabetes mellitus with other circulatory complications: Secondary | ICD-10-CM | POA: Diagnosis not present

## 2018-07-09 DIAGNOSIS — F419 Anxiety disorder, unspecified: Secondary | ICD-10-CM | POA: Diagnosis not present

## 2018-07-09 DIAGNOSIS — I5032 Chronic diastolic (congestive) heart failure: Secondary | ICD-10-CM | POA: Diagnosis present

## 2018-07-09 DIAGNOSIS — I252 Old myocardial infarction: Secondary | ICD-10-CM | POA: Diagnosis not present

## 2018-07-09 DIAGNOSIS — Z885 Allergy status to narcotic agent status: Secondary | ICD-10-CM

## 2018-07-09 DIAGNOSIS — R109 Unspecified abdominal pain: Secondary | ICD-10-CM | POA: Diagnosis present

## 2018-07-09 DIAGNOSIS — D631 Anemia in chronic kidney disease: Secondary | ICD-10-CM | POA: Diagnosis present

## 2018-07-09 DIAGNOSIS — K4000 Bilateral inguinal hernia, with obstruction, without gangrene, not specified as recurrent: Secondary | ICD-10-CM | POA: Diagnosis present

## 2018-07-09 DIAGNOSIS — I2510 Atherosclerotic heart disease of native coronary artery without angina pectoris: Secondary | ICD-10-CM | POA: Diagnosis not present

## 2018-07-09 DIAGNOSIS — Z9115 Patient's noncompliance with renal dialysis: Secondary | ICD-10-CM

## 2018-07-09 DIAGNOSIS — R1084 Generalized abdominal pain: Secondary | ICD-10-CM

## 2018-07-09 DIAGNOSIS — E1122 Type 2 diabetes mellitus with diabetic chronic kidney disease: Secondary | ICD-10-CM | POA: Diagnosis present

## 2018-07-09 DIAGNOSIS — K4021 Bilateral inguinal hernia, without obstruction or gangrene, recurrent: Secondary | ICD-10-CM

## 2018-07-09 DIAGNOSIS — Z992 Dependence on renal dialysis: Secondary | ICD-10-CM

## 2018-07-09 DIAGNOSIS — Z79899 Other long term (current) drug therapy: Secondary | ICD-10-CM

## 2018-07-09 DIAGNOSIS — N186 End stage renal disease: Secondary | ICD-10-CM | POA: Diagnosis present

## 2018-07-09 DIAGNOSIS — K529 Noninfective gastroenteritis and colitis, unspecified: Secondary | ICD-10-CM | POA: Diagnosis present

## 2018-07-09 DIAGNOSIS — Z8673 Personal history of transient ischemic attack (TIA), and cerebral infarction without residual deficits: Secondary | ICD-10-CM

## 2018-07-09 DIAGNOSIS — N189 Chronic kidney disease, unspecified: Secondary | ICD-10-CM | POA: Diagnosis present

## 2018-07-09 DIAGNOSIS — Z1159 Encounter for screening for other viral diseases: Secondary | ICD-10-CM

## 2018-07-09 DIAGNOSIS — N2581 Secondary hyperparathyroidism of renal origin: Secondary | ICD-10-CM | POA: Diagnosis present

## 2018-07-09 DIAGNOSIS — Z89612 Acquired absence of left leg above knee: Secondary | ICD-10-CM

## 2018-07-09 LAB — CBC WITH DIFFERENTIAL/PLATELET
Abs Immature Granulocytes: 0.01 10*3/uL (ref 0.00–0.07)
Basophils Absolute: 0 10*3/uL (ref 0.0–0.1)
Basophils Relative: 1 %
Eosinophils Absolute: 0.1 10*3/uL (ref 0.0–0.5)
Eosinophils Relative: 2 %
HCT: 32.7 % — ABNORMAL LOW (ref 39.0–52.0)
Hemoglobin: 10.2 g/dL — ABNORMAL LOW (ref 13.0–17.0)
Immature Granulocytes: 0 %
Lymphocytes Relative: 28 %
Lymphs Abs: 1.1 10*3/uL (ref 0.7–4.0)
MCH: 31.3 pg (ref 26.0–34.0)
MCHC: 31.2 g/dL (ref 30.0–36.0)
MCV: 100.3 fL — ABNORMAL HIGH (ref 80.0–100.0)
Monocytes Absolute: 0.5 10*3/uL (ref 0.1–1.0)
Monocytes Relative: 13 %
Neutro Abs: 2.2 10*3/uL (ref 1.7–7.7)
Neutrophils Relative %: 56 %
Platelets: 139 10*3/uL — ABNORMAL LOW (ref 150–400)
RBC: 3.26 MIL/uL — ABNORMAL LOW (ref 4.22–5.81)
RDW: 15.2 % (ref 11.5–15.5)
WBC: 3.9 10*3/uL — ABNORMAL LOW (ref 4.0–10.5)
nRBC: 0 % (ref 0.0–0.2)

## 2018-07-09 LAB — COMPREHENSIVE METABOLIC PANEL
ALT: 20 U/L (ref 0–44)
AST: 25 U/L (ref 15–41)
Albumin: 3.3 g/dL — ABNORMAL LOW (ref 3.5–5.0)
Alkaline Phosphatase: 84 U/L (ref 38–126)
Anion gap: 13 (ref 5–15)
BUN: 36 mg/dL — ABNORMAL HIGH (ref 6–20)
CO2: 29 mmol/L (ref 22–32)
Calcium: 9.9 mg/dL (ref 8.9–10.3)
Chloride: 97 mmol/L — ABNORMAL LOW (ref 98–111)
Creatinine, Ser: 6.74 mg/dL — ABNORMAL HIGH (ref 0.61–1.24)
GFR calc Af Amer: 9 mL/min — ABNORMAL LOW (ref >60)
GFR calc non Af Amer: 8 mL/min — ABNORMAL LOW (ref >60)
Glucose, Bld: 111 mg/dL — ABNORMAL HIGH (ref 70–99)
Potassium: 4.7 mmol/L (ref 3.5–5.1)
Sodium: 139 mmol/L (ref 135–145)
Total Bilirubin: 1.1 mg/dL (ref 0.3–1.2)
Total Protein: 6.7 g/dL (ref 6.5–8.1)

## 2018-07-09 LAB — LIPASE, BLOOD: Lipase: 24 U/L (ref 11–51)

## 2018-07-09 MED ORDER — ONDANSETRON HCL 4 MG/2ML IJ SOLN
4.0000 mg | Freq: Once | INTRAMUSCULAR | Status: AC
  Administered 2018-07-09: 4 mg via INTRAVENOUS
  Filled 2018-07-09: qty 2

## 2018-07-09 MED ORDER — FENTANYL CITRATE (PF) 100 MCG/2ML IJ SOLN
100.0000 ug | Freq: Once | INTRAMUSCULAR | Status: AC
  Administered 2018-07-09: 100 ug via INTRAVENOUS
  Filled 2018-07-09: qty 2

## 2018-07-09 MED ORDER — IOHEXOL 350 MG/ML SOLN
100.0000 mL | Freq: Once | INTRAVENOUS | Status: AC | PRN
  Administered 2018-07-09: 100 mL via INTRAVENOUS

## 2018-07-09 NOTE — ED Provider Notes (Signed)
[facility] Provider Note   CSN: [id number] Arrival date & time: 07/09/18  1648    History   Chief Complaint Chief Complaint  Patient presents with   Abdominal Pain    HPI Matthew Kline is a 58 y.o. male.     HPI   58 year old male presents with concern for epigastric and left upper quadrant abdominal pain, as well as reported increasing pain in his inguinal inguinal hernia for 3 days.  Describes it as a sharp pain. It is worse after eating, located in LUQ and epigastrium. Associated nausea and dry heaves. No diarrhea or constipation. No fevers.  Hx of cholelithiasis. ESRD on dialysis, has not missed session.  Past Medical History:  Diagnosis Date   Anemia, unspecified    Anxiety    Arthralgia 2010   polyarticular   Arthritis    "back, knees" (01/10/2017)   Cancer ([place])    "kidney area" (01/10/2017)   CHF (congestive heart failure) ([place]) 07/25/2009   denies   Chronic lower back pain    Coronary artery disease    Coughing    pt. reports that he has drainage from sinus infection   Diabetic foot ulcer ([place])    Diabetic neuropathy ([place])    ESRD (end stage renal disease) on dialysis ([place])    started 12/2012; "MWF; [facility] "  (01/10/2017)   GERD (gastroesophageal reflux disease)    hx "before I lost weight", no problem 9 years   Hemodialysis access site with mature fistula ([place])    Hemorrhoids, internal 10/2011   small   High cholesterol    History of blood transfusion    "related to the anemia"   Hypertension    Insomnia, unspecified    Lacunar infarction ([place]) 2006   RUE/RLE, speech   Long term (current) use of anticoagulants    Myocardial infarction ([place]) 1995   Orthostatic hypotension    Osteomyelitis of foot, left, acute ([place])    Other chronic postoperative pain    Pneumonia    "probably twice" (01/10/2017)   Polymyalgia rheumatica ([place])    Renal insufficiency    Sleep  apnea    "lost weight; no more problem" (01/10/2017)   Stroke ([place]) 01/10/06   denies residual on 05/09/2014   Type II diabetes mellitus ([place]) dx'd 1995   Unspecified hereditary and idiopathic peripheral neuropathy    feet   Unspecified osteomyelitis, site unspecified    Unspecified vitamin D deficiency     Patient Active Problem List   Diagnosis Date Noted   Acute respiratory failure with hypoxia ([place]) 07/10/2018   Abdominal pain 07/10/2018   Altered mental status    Cerebral thrombosis with cerebral infarction 02/06/2017   Cerebral embolism with cerebral infarction 02/06/2017   Above knee amputation status, left    Pressure injury of skin 01/30/2017   Acute lower UTI 92/11/9415   Acute metabolic encephalopathy 40/81/4481   UTI (urinary tract infection) 01/29/2017   Quadriceps muscle rupture, left, initial encounter    Fall 01/09/2017   Gait disturbance 01/09/2017   Staphylococcus aureus infection 01/09/2017   Infection of prosthetic left knee joint ([place]) 01/09/2017   Fever, unknown origin 11/09/2016   Critical lower limb ischemia 08/25/2016   Ulcer of left midfoot with fat layer exposed ([place]) 08/13/2016   Diabetic ulcer of left midfoot associated with type 2 diabetes mellitus, with fat layer exposed ([place]) 07/25/2016   Peripheral neuropathy 07/22/2016   Tobacco abuse 07/22/2016   CAD in  native artery 05/18/2016   CAD, multiple vessel 05/11/2016   Positive cardiac stress test 05/11/2016   Abnormal stress test 04/30/2016   Pre-transplant evaluation for kidney transplant 04/30/2016   S/P revision of total knee 11/26/2015   Pain in the chest    Acute on chronic diastolic heart failure (HCC) 07/05/2015   Volume overload 07/04/2015   Shortness of breath 07/04/2015   Hypoxemia 07/04/2015   Elevated troponin    End-stage renal disease on hemodialysis (HCC)    Hypervolemia    Failed total knee arthroplasty, 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    Pyogenic bacterial arthritis of knee, left (HCC) 08/07/2014   Tachycardia 07/24/2014   Acute upper respiratory infection 07/24/2014   ESRD on dialysis ([place]) 07/14/2014   Type II diabetes mellitus ([place]) 07/14/2014   Anemia in chronic kidney disease 07/14/2014   Congestive heart failure (CHF) ([place]) 07/13/2014   Surgical wound dehiscence 05/09/2014   Dehiscence of closure of skin 05/09/2014   Total knee replacement status 04/10/2014   Diabetes mellitus with renal manifestations, controlled ([place]) 10/24/2013   Hypertensive renal disease 06/27/2013   DM type 2 causing vascular disease ([place]) 06/27/2013   Erectile dysfunction 06/27/2013   Depression 06/27/2013   Claudication of left lower extremity ([place]) 12/19/2012   Essential hypertension, benign 12/19/2012   Sinusitis, acute maxillary 11/22/2012   Otitis, externa, infective 11/14/2012   Leg edema, left 11/14/2012   End stage renal disease ([place]) 10/02/2012   Controlled type 2 DM with proteinuria or microalbuminuria 09/19/2012   GERD (gastroesophageal reflux disease) 09/19/2012   Leukocytosis 09/19/2012   Lacunar infarction ([place]) 08/17/2012   Polymyalgia rheumatica ([place]) 08/17/2012   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   Essential hypertension 05/10/2012   Vitamin D deficiency 05/10/2012   Diabetes mellitus due to underlying condition ([place]) 05/10/2012   Hyperlipidemia LDL goal <100 05/10/2012   Anemia of chronic disease 05/10/2012   Screening for prostate cancer 05/10/2012   Chronic kidney disease (CKD), stage IV (severe) ([place]) 05/10/2012   Peripheral autonomic neuropathy due to DM ([place]) 05/10/2012   Callus of foot 05/10/2012   Urgency of urination 05/10/2012   Hyperkalemia 05/10/2012   Candidiasis of the esophagus 10/12/2011   Internal hemorrhoids without mention of complication 09/38/1829   Pre-syncope 07/25/2009   DJD (degenerative joint disease) of cervical spine 02/17/2009    Past  Surgical History:  Procedure Laterality Date   ABDOMINAL AORTOGRAM N/A 08/25/2016   Procedure: ABDOMINAL AORTOGRAM;  Surgeon: Wellington Hampshire, MD;  Location: [facility];  Service: Cardiovascular;  Laterality: N/A;   AMPUTATION  01/21/2012   Procedure: AMPUTATION RAY;  Surgeon: [name], MD;  Location: [facility];  Service: Orthopedics;  Laterality: Left;  Left Foot 4th Ray Amputation   AMPUTATION Left 05/04/2013   Procedure: AMPUTATION DIGIT;  Surgeon: [name], MD;  Location: [facility];  Service: Orthopedics;  Laterality: Left;  Left Great Toe Amputation at [facility]   AMPUTATION Left 01/14/2017   Procedure: AMPUTATION ABOVE LEFT KNEE;  Surgeon: [name], MD;  Location: [facility];  Service: Orthopedics;  Laterality: Left;   ANTERIOR CERVICAL DECOMP/DISCECTOMY FUSION  02/2011   BACK SURGERY     BASCILIC VEIN TRANSPOSITION Left 10/19/2012   Procedure: BASCILIC VEIN TRANSPOSITION;  Surgeon: Serafina Mitchell, MD;  Location: [facility];  Service: Vascular;  Laterality: Left;   CARDIAC CATHETERIZATION     "before bypass"   CORONARY ARTERY BYPASS GRAFT     x 5 with lima [facility]  EXCISIONAL TOTAL KNEE ARTHROPLASTY WITH ANTIBIOTIC SPACERS Left 08/07/2014   Procedure: Replace Left Total Knee Arthroplasty,  Place Antibiotic Spacer;  Surgeon: [name], MD;  Location: [facility];  Service: Orthopedics;  Laterality: Left;   I&D EXTREMITY Left 05/09/2014   Procedure: Irrigation and Debridement Left Knee and Closure of Total Knee Arthroplasty Incision;  Surgeon: [name], MD;  Location: [facility];  Service: Orthopedics;  Laterality: Left;   I&D KNEE WITH POLY EXCHANGE Left 05/31/2014   Procedure: IRRIGATION AND DEBRIDEMENT LEFT KNEE, PLACE ANTIBIOTIC BEADS,  POLY EXCHANGE;  Surgeon: [name], MD;  Location: [facility];  Service: Orthopedics;  Laterality: Left;   IRRIGATION AND DEBRIDEMENT KNEE Left 01/12/2017   Procedure: IRRIGATION AND DEBRIDEMENT LEFT KNEE;  Surgeon: [name], MD;   Location: [facility];  Service: Orthopedics;  Laterality: Left;   JOINT REPLACEMENT     KNEE ARTHROSCOPY Left 08-25-2012   LOWER EXTREMITY ANGIOGRAPHY Left 08/25/2016   Procedure: Lower Extremity Angiography;  Surgeon: Wellington Hampshire, MD;  Location: [facility];  Service: Cardiovascular;  Laterality: Left;   PERIPHERAL VASCULAR BALLOON ANGIOPLASTY Left 08/25/2016   Procedure: PERIPHERAL VASCULAR BALLOON ANGIOPLASTY;  Surgeon: Wellington Hampshire, MD;  Location: [facility];  Service: Cardiovascular;  Laterality: Left;  lt peroneal and ant tibial arteries cutting balloon   REFRACTIVE SURGERY Bilateral    TOE AMPUTATION Bilateral    "I've lost 7 toes over the last 7 years" (05/09/2014)   TOE SURGERY Left April 2015   Big toe removed on left foot.   TONSILLECTOMY     TOTAL KNEE ARTHROPLASTY Left 04/10/2014   Procedure: TOTAL KNEE ARTHROPLASTY;  Surgeon: [name], MD;  Location: [facility];  Service: Orthopedics;  Laterality: Left;   TOTAL KNEE REVISION Left 10/25/2014   Procedure: LEFT TOTAL KNEE REVISION;  Surgeon: [name], MD;  Location: [facility];  Service: Orthopedics;  Laterality: Left;   TOTAL KNEE REVISION Left 11/26/2015   Procedure: Removal Left Total Knee Arthroplasty, Hinged Total Knee Arthroplasty;  Surgeon: [name], MD;  Location: [facility];  Service: Orthopedics;  Laterality: Left;   UVULOPALATOPHARYNGOPLASTY, TONSILLECTOMY AND SEPTOPLASTY  ~ [place] Left 05/09/2014   Dehiscence Left Total Knee Arthroplasty Incision        Home Medications    Prior to Admission medications   Medication Sig Start Date End Date Taking? Authorizing Provider  amLODipine (NORVASC) 5 MG tablet Take 5 mg by mouth daily.    Yes [provider]  doxycycline (VIBRA-TABS) 100 MG tablet Take 100 mg by mouth 2 (two) times daily.   Yes [provider]  hydrOXYzine (ATARAX/VISTARIL) 25 MG tablet Take 25 mg by mouth 3 (three) times daily.   Yes  [provider]  sevelamer carbonate (RENVELA) 800 MG tablet Take 2,400 mg by mouth 3 (three) times daily with meals.    Yes [provider]  temazepam (RESTORIL) 30 MG capsule Take 30 mg by mouth at bedtime as needed for sleep.   Yes [provider]  Amino Acids-Protein Hydrolys (FEEDING SUPPLEMENT, PRO-STAT SUGAR FREE 64,) LIQD Take 30 mLs by mouth 2 (two) times daily. Patient not taking: Reported on 11/30/2017 01/19/17   Patrecia Pour, Christean Grief, MD  aspirin EC 81 MG EC tablet Take 2 tablets (162 mg total) by mouth daily. Patient not taking: Reported on 11/30/2017 01/20/17   Patrecia Pour, Christean Grief, MD  carvedilol (COREG) 12.5 MG tablet Take 1 tablet (  12.5 mg total) by mouth 2 (two) times daily with a meal. Patient not taking: Reported on 07/10/2018 02/08/17   Bonnell Public, MD  Darbepoetin Alfa (ARANESP) 150 MCG/0.3ML SOSY injection Inject 0.3 mLs (150 mcg total) into the vein every Friday with hemodialysis. Patient not taking: Reported on 07/10/2018 02/11/17   Dana Allan I, MD  ferric citrate (AURYXIA) 1 GM 210 MG(Fe) tablet Take 2 tablets (420 mg total) by mouth 3 (three) times daily with meals. Patient not taking: Reported on 11/30/2017 02/08/17   Dana Allan I, MD  gabapentin (NEURONTIN) 300 MG capsule TAKE 1 CAPSULE BY MOUTH THREE TIMES A DAY Patient not taking: No sig reported 10/24/17   [name], MD  montelukast (SINGULAIR) 10 MG tablet TAKE 1 TABLET (10 MG TOTAL) BY MOUTH AT BEDTIME. Patient not taking: No sig reported 09/01/15   Gildardo Cranker, DO  multivitamin (RENA-VIT) TABS tablet Take 1 tablet by mouth at bedtime. Patient not taking: Reported on 07/10/2018 02/08/17   Bonnell Public, MD  Nutritional Supplements (FEEDING SUPPLEMENT, NEPRO CARB STEADY,) LIQD Take 237 mLs by mouth 2 (two) times daily between meals. Patient not taking: Reported on 11/30/2017 01/19/17   Patrecia Pour, Christean Grief, MD  ondansetron (ZOFRAN ODT) 4 MG disintegrating tablet Take  1 tablet (4 mg total) by mouth every 8 (eight) hours as needed for nausea or vomiting. Patient not taking: Reported on 07/10/2018 12/16/17   Kinnie Feil, PA-C  pentoxifylline (TRENTAL) 400 MG CR tablet TAKE 1 TABLET (400 MG TOTAL) BY MOUTH 3 (THREE) TIMES DAILY WITH MEALS. Patient not taking: Reported on 07/10/2018 02/22/18   [name], MD  traMADol (ULTRAM) 50 MG tablet Take 1 tablet (50 mg total) by mouth every 6 (six) hours as needed. Patient not taking: Reported on 07/10/2018 12/16/17   Kinnie Feil, PA-C    Family History Family History  Problem Relation Age of Onset   Hypertension Mother    Cancer Mother 5       Ovarian   Heart disease Maternal Aunt    Stroke Maternal Grandfather     Social History Social History   Tobacco Use   Smoking status: Former Smoker    Packs/day: 0.12    Years: 32.00    Pack years: 3.84    Types: Cigarettes    Quit date: 10/05/2016    Years since quitting: 1.7   Smokeless tobacco: Never Used  Substance Use Topics   Alcohol use: No    Alcohol/week: 0.0 standard drinks   Drug use: No     Allergies   Morphine and related and Tygacil [tigecycline]   Review of Systems Review of Systems  Constitutional: Negative for fever.  HENT: Negative for sore throat.   Eyes: Negative for visual disturbance.  Respiratory: Negative for cough and shortness of breath.   Cardiovascular: Negative for chest pain.  Gastrointestinal: Positive for abdominal pain and nausea. Negative for constipation, diarrhea and vomiting.  Genitourinary: Difficulty urinating: rarely makes urine.  Musculoskeletal: Negative for back pain and neck stiffness.  Skin: Negative for rash.  Neurological: Negative for syncope and headaches.     Physical Exam Updated Vital Signs BP (!) 148/88 (BP Location: Right Arm)    Pulse 84    Temp 97.6 F (36.4 C) (Oral)    Resp 18    Ht 5\' 11"  (1.803 m)    Wt 75.6 kg Comment: without prosthetic   SpO2 96%    BMI 23.25  kg/m   Physical  Exam Vitals signs and nursing note reviewed.  Constitutional:      General: He is not in acute distress.    Appearance: He is well-developed. He is not diaphoretic.  HENT:     Head: Normocephalic and atraumatic.  Eyes:     Conjunctiva/sclera: Conjunctivae normal.  Neck:     Musculoskeletal: Normal range of motion.  Cardiovascular:     Rate and Rhythm: Normal rate and regular rhythm.     Heart sounds: Normal heart sounds.  Pulmonary:     Effort: Pulmonary effort is normal. No respiratory distress.     Breath sounds: Normal breath sounds. No wheezing or rales.  Abdominal:     General: There is no distension.     Tenderness: There is generalized abdominal tenderness. There is no guarding.     Hernia: A hernia is present. Hernia is present in the right inguinal area (reducible).  Skin:    General: Skin is warm and dry.  Neurological:     Mental Status: He is alert and oriented to person, place, and time.      ED Treatments / Results  Labs (all labs ordered are listed, but only abnormal results are displayed) Labs Reviewed  CBC WITH DIFFERENTIAL/PLATELET - Abnormal; Notable for the following components:      Result Value   WBC 3.9 (*)    RBC 3.26 (*)    Hemoglobin 10.2 (*)    HCT 32.7 (*)    MCV 100.3 (*)    Platelets 139 (*)    All other components within normal limits  COMPREHENSIVE METABOLIC PANEL - Abnormal; Notable for the following components:   Chloride 97 (*)    Glucose, Bld 111 (*)    BUN 36 (*)    Creatinine, Ser 6.74 (*)    Albumin 3.3 (*)    GFR calc non Af Amer 8 (*)    GFR calc Af Amer 9 (*)    All other components within normal limits  BASIC METABOLIC PANEL - Abnormal; Notable for the following components:   Chloride 93 (*)    Glucose, Bld 123 (*)    BUN 41 (*)    Creatinine, Ser 6.91 (*)    GFR calc non Af Amer 8 (*)    GFR calc Af Amer 9 (*)    All other components within normal limits  HEPATIC FUNCTION PANEL - Abnormal;  Notable for the following components:   Total Protein 6.2 (*)    Albumin 3.0 (*)    Bilirubin, Direct 0.3 (*)    All other components within normal limits  CBC WITH DIFFERENTIAL/PLATELET - Abnormal; Notable for the following components:   WBC 3.4 (*)    RBC 3.13 (*)    Hemoglobin 9.7 (*)    HCT 30.8 (*)    Platelets 132 (*)    All other components within normal limits  SARS CORONAVIRUS 2 (HOSPITAL ORDER, [facility])  MRSA PCR SCREENING  LIPASE, BLOOD  TROPONIN I (HIGH SENSITIVITY)  URINALYSIS, ROUTINE W REFLEX MICROSCOPIC  HIV ANTIBODY (ROUTINE TESTING W REFLEX)  TROPONIN I (HIGH SENSITIVITY)  RENAL FUNCTION PANEL    EKG None  Radiology Ct Abdomen Pelvis Wo Contrast  Result Date: 07/09/2018 CLINICAL DATA:  Abdominal pain and vomiting 2 days. Bowel obstruction. Inguinal hernia. EXAM: CT ABDOMEN AND PELVIS WITHOUT CONTRAST TECHNIQUE: Multidetector CT imaging of the abdomen and pelvis was performed following the standard protocol without IV contrast. COMPARISON:  01/02/2018 FINDINGS: Lower chest: New  small bilateral pleural effusions and bibasilar atelectasis. Hepatobiliary: No mass visualized on this unenhanced exam. Tiny calcified gallstones are again seen, without definite evidence of cholecystitis. Pancreas: No mass or inflammatory process visualized on this unenhanced exam. Spleen:  Within normal limits in size. Adrenals/Urinary tract: Stable bilateral diffuse renal parenchymal atrophy. No evidence of renal masses or hydronephrosis. Diffuse renal vascular calcification noted. Stomach/Bowel: No evidence of obstruction or focal inflammatory process. A moderate size right inguinal hernia is seen containing several small bowel loops, however there is no evidence of obstruction or strangulation. A small left inguinal hernia is seen containing only fat. Vascular/Lymphatic: No pathologically enlarged lymph nodes identified. No evidence of abdominal aortic aneurysm.  Aortic atherosclerosis. Reproductive:  No mass or other significant abnormality. Other: Mild ascites is new since previous study. Diffuse mesenteric and body wall edema is also new since prior exam. Musculoskeletal:  No suspicious bone lesions identified. IMPRESSION: 1. New mild ascites, diffuse mesenteric and body wall edema, and small bilateral pleural effusions, consistent with 3rd spacing. No focal inflammatory process or mass identified. 2. Increased sizes of right inguinal hernia containing several small bowel loops, and left inguinal hernia containing only fat. No evidence of bowel obstruction or strangulation. 3. Cholelithiasis. No radiographic evidence of cholecystitis. Electronically Signed   By: Marlaine Hind M.D.   On: 07/09/2018 21:13   Dg Chest Portable 1 View  Result Date: 07/09/2018 CLINICAL DATA:  Pleural effusions, history CHF, coronary artery disease post MI, diabetes mellitus, end-stage renal disease on dialysis, hypertension EXAM: PORTABLE CHEST 1 VIEW COMPARISON:  Portable exam 2047 hours compared to 02/15/2018 FINDINGS: Lordotic positioning. Enlargement of cardiac silhouette with pulmonary vascular congestion post median sternotomy. LEFT basilar atelectasis. Lungs otherwise clear. No definite infiltrate, pleural effusion or pneumothorax. Bones demineralized. IMPRESSION: Enlargement of cardiac silhouette with pulmonary vascular congestion. LEFT basilar atelectasis. Electronically Signed   By: Lavonia Dana M.D.   On: 07/09/2018 23:01   Ct Angio Abd/pel W And/or Wo Contrast  Result Date: 07/09/2018 CLINICAL DATA:  Abdominal pain.  Assess for mesenteric ischemia EXAM: CTA ABDOMEN AND PELVIS wITHOUT AND WITH CONTRAST TECHNIQUE: Multidetector CT imaging of the abdomen and pelvis was performed using the standard protocol during bolus administration of intravenous contrast. Multiplanar reconstructed images and MIPs were obtained and reviewed to evaluate the vascular anatomy. CONTRAST:  167mL  OMNIPAQUE IOHEXOL 350 MG/ML SOLN COMPARISON:  Noncontrast study performed earlier today. FINDINGS: VASCULAR Aorta: Heavily calcified.  No aneurysm or dissection. Celiac: Calcified, widely patent SMA: Calcified, widely patent Renals: Heavily calcified bilaterally. Probable hemodynamically significant stenosis within the proximal left renal artery, greater than 50%. IMA: Calcified with mild stenosis at the origin, likely approximately 50%. Inflow: Heavily calcified.  No aneurysm. Proximal Outflow: Heavily calcified. Vessels are poorly opacified which appears to be due to slow flow rather than focal stenosis. Veins: Grossly unremarkable. Review of the MIP images confirms the above findings. NON-VASCULAR Lower chest: Small to moderate bilateral pleural effusions. There is cardiomegaly. Bilateral lower lobe airspace opacities. Hepatobiliary: Small gallstones within the gallbladder. No focal hepatic abnormality. Pancreas: No focal abnormality or ductal dilatation. Spleen: No focal abnormality.  Normal size. Adrenals/Urinary Tract: Atrophic kidneys compatible with end-stage renal disease. No adrenal mass. Urinary bladder is decompressed, grossly unremarkable. Stomach/Bowel: Stomach, large and small bowel grossly unremarkable. Lymphatic: No adenopathy Reproductive: Moderate free fluid in the cul-de-sac and adjacent to the liver and spleen. Other: Bilateral inguinal hernias. The right inguinal hernia contains small bowel loops. No evidence of obstruction. Left inguinal hernia  contains fat. Musculoskeletal: No acute bony abnormality. IMPRESSION: VASCULAR Heavily calcified aorta and branch vessels. Mild stenosis at the origin of the IMA. Celiac and SMA appear widely patent. Heavily calcified renal arteries bilaterally likely with hemodynamically significant stenosis within the proximal left renal artery. NON-VASCULAR Cholelithiasis. Moderate ascites. Bilateral inguinal hernias, containing small bowel on the right and fat on  the left. No evidence of bowel obstruction. Electronically Signed   By: Rolm Baptise M.D.   On: 07/09/2018 23:42    Procedures Procedures (including critical care time)  Medications Ordered in ED Medications  doxycycline (VIBRA-TABS) tablet 100 mg (100 mg Oral Given 07/10/18 0304)  amLODipine (NORVASC) tablet 5 mg (has no administration in time range)  hydrOXYzine (ATARAX/VISTARIL) tablet 25 mg (has no administration in time range)  temazepam (RESTORIL) capsule 30 mg (30 mg Oral Given 07/10/18 0304)  acetaminophen (TYLENOL) tablet 650 mg (has no administration in time range)    Or  acetaminophen (TYLENOL) suppository 650 mg (has no administration in time range)  ondansetron (ZOFRAN) tablet 4 mg (has no administration in time range)    Or  ondansetron (ZOFRAN) injection 4 mg (has no administration in time range)  MEDLINE mouth rinse (has no administration in time range)  Chlorhexidine Gluconate Cloth 2 % PADS 6 each (has no administration in time range)  doxercalciferol (HECTOROL) injection 1 mcg (has no administration in time range)  sevelamer carbonate (RENVELA) tablet 2,400 mg (has no administration in time range)  fentaNYL (SUBLIMAZE) injection 100 mcg (100 mcg Intravenous Given 07/09/18 1819)  ondansetron (ZOFRAN) injection 4 mg (4 mg Intravenous Given 07/09/18 1819)  iohexol (OMNIPAQUE) 350 MG/ML injection 100 mL (100 mLs Intravenous Contrast Given 07/09/18 2320)  fentaNYL (SUBLIMAZE) injection 50 mcg (50 mcg Intravenous Given 07/10/18 0123)  hydrOXYzine (ATARAX/VISTARIL) tablet 25 mg (25 mg Oral Given 07/10/18 0304)  technetium TC 31M mebrofenin (CHOLETEC) injection 5 millicurie (5 millicuries Intravenous Contrast Given 07/10/18 0809)     Initial Impression / Assessment and Plan / ED Course  I have reviewed the triage vital signs and the nursing notes.  Pertinent labs & imaging results that were available during my care of the patient were reviewed by me and considered in my medical  decision making (see chart for details).        58yo male with hx of ESRD on dialysis MWF, htn, hlpd, DM, left AKA, CAD, presents with concern for 3 days of abdominal pain.  Lipase normal. No sign of hepatitis.  CT abd pelvis WO contrast shows no evidence of obstruction, does show new mild ascites and mesenteric edema.  Given degree of pain, will order CTA to ensure we are not missing other vascular phenomenon. Overall, I suspect his pain is secondary to volume overload.  I have a low suspicion for SBP in low volume ascites without fever.  On chart review, he has had a few similar presentations.  Recommend continued outpt surgical follow up and med clearance for surgery as they had recommended for his inguinal hernias. There is no sign of incarceration, strangulation or obstruction.   On my reevaluation his oxygenation has decreased, likely secondary to volume overload as well.  CTA pending, pt will likely require admission for dialysis due to hypoxia.    Final Clinical Impressions(s) / ED Diagnoses   Final diagnoses:  Generalized abdominal pain  Bilateral recurrent inguinal hernia without obstruction or gangrene  Acute respiratory failure with hypoxia (HCC)  Other hypervolemia    ED Discharge Orders    None  Gareth Morgan, MD 07/10/18 [phone]

## 2018-07-09 NOTE — ED Notes (Signed)
EDP at bedside  

## 2018-07-09 NOTE — ED Notes (Signed)
Patient transported to CT 

## 2018-07-09 NOTE — ED Triage Notes (Signed)
Pt here for abdominal pain x 3 days. States it is progressive. Pt has hx of hernia in lower quad. Denies fever, SOB, cough. Pt is dialysis pt, has not missed any treatments

## 2018-07-09 NOTE — ED Notes (Signed)
Pt o2 saturation dropped while sleeping, placed on 2 L

## 2018-07-10 ENCOUNTER — Encounter (HOSPITAL_COMMUNITY): Payer: Self-pay | Admitting: Internal Medicine

## 2018-07-10 ENCOUNTER — Other Ambulatory Visit: Payer: Self-pay

## 2018-07-10 ENCOUNTER — Observation Stay (HOSPITAL_COMMUNITY): Payer: Medicare Other

## 2018-07-10 DIAGNOSIS — I132 Hypertensive heart and chronic kidney disease with heart failure and with stage 5 chronic kidney disease, or end stage renal disease: Secondary | ICD-10-CM | POA: Diagnosis not present

## 2018-07-10 DIAGNOSIS — N186 End stage renal disease: Secondary | ICD-10-CM | POA: Diagnosis not present

## 2018-07-10 DIAGNOSIS — Z1159 Encounter for screening for other viral diseases: Secondary | ICD-10-CM | POA: Diagnosis not present

## 2018-07-10 DIAGNOSIS — K529 Noninfective gastroenteritis and colitis, unspecified: Secondary | ICD-10-CM | POA: Diagnosis not present

## 2018-07-10 DIAGNOSIS — R109 Unspecified abdominal pain: Secondary | ICD-10-CM | POA: Diagnosis present

## 2018-07-10 DIAGNOSIS — K219 Gastro-esophageal reflux disease without esophagitis: Secondary | ICD-10-CM | POA: Diagnosis not present

## 2018-07-10 DIAGNOSIS — E1122 Type 2 diabetes mellitus with diabetic chronic kidney disease: Secondary | ICD-10-CM | POA: Diagnosis not present

## 2018-07-10 DIAGNOSIS — N185 Chronic kidney disease, stage 5: Secondary | ICD-10-CM | POA: Diagnosis not present

## 2018-07-10 DIAGNOSIS — R1084 Generalized abdominal pain: Secondary | ICD-10-CM | POA: Diagnosis not present

## 2018-07-10 DIAGNOSIS — I5032 Chronic diastolic (congestive) heart failure: Secondary | ICD-10-CM | POA: Diagnosis not present

## 2018-07-10 DIAGNOSIS — R188 Other ascites: Secondary | ICD-10-CM | POA: Diagnosis not present

## 2018-07-10 DIAGNOSIS — M353 Polymyalgia rheumatica: Secondary | ICD-10-CM | POA: Diagnosis not present

## 2018-07-10 DIAGNOSIS — J9601 Acute respiratory failure with hypoxia: Secondary | ICD-10-CM | POA: Diagnosis present

## 2018-07-10 DIAGNOSIS — D631 Anemia in chronic kidney disease: Secondary | ICD-10-CM | POA: Diagnosis not present

## 2018-07-10 DIAGNOSIS — F419 Anxiety disorder, unspecified: Secondary | ICD-10-CM | POA: Diagnosis not present

## 2018-07-10 DIAGNOSIS — Z951 Presence of aortocoronary bypass graft: Secondary | ICD-10-CM | POA: Diagnosis not present

## 2018-07-10 DIAGNOSIS — N2581 Secondary hyperparathyroidism of renal origin: Secondary | ICD-10-CM | POA: Diagnosis not present

## 2018-07-10 DIAGNOSIS — K4021 Bilateral inguinal hernia, without obstruction or gangrene, recurrent: Secondary | ICD-10-CM

## 2018-07-10 DIAGNOSIS — E1151 Type 2 diabetes mellitus with diabetic peripheral angiopathy without gangrene: Secondary | ICD-10-CM | POA: Diagnosis not present

## 2018-07-10 DIAGNOSIS — K4000 Bilateral inguinal hernia, with obstruction, without gangrene, not specified as recurrent: Secondary | ICD-10-CM | POA: Diagnosis not present

## 2018-07-10 DIAGNOSIS — E1140 Type 2 diabetes mellitus with diabetic neuropathy, unspecified: Secondary | ICD-10-CM | POA: Diagnosis not present

## 2018-07-10 DIAGNOSIS — E1159 Type 2 diabetes mellitus with other circulatory complications: Secondary | ICD-10-CM | POA: Diagnosis not present

## 2018-07-10 DIAGNOSIS — D696 Thrombocytopenia, unspecified: Secondary | ICD-10-CM | POA: Diagnosis not present

## 2018-07-10 DIAGNOSIS — E8770 Fluid overload, unspecified: Secondary | ICD-10-CM | POA: Diagnosis not present

## 2018-07-10 DIAGNOSIS — I2510 Atherosclerotic heart disease of native coronary artery without angina pectoris: Secondary | ICD-10-CM | POA: Diagnosis not present

## 2018-07-10 DIAGNOSIS — E785 Hyperlipidemia, unspecified: Secondary | ICD-10-CM | POA: Diagnosis not present

## 2018-07-10 DIAGNOSIS — Z992 Dependence on renal dialysis: Secondary | ICD-10-CM

## 2018-07-10 DIAGNOSIS — K8020 Calculus of gallbladder without cholecystitis without obstruction: Secondary | ICD-10-CM | POA: Diagnosis not present

## 2018-07-10 DIAGNOSIS — I252 Old myocardial infarction: Secondary | ICD-10-CM | POA: Diagnosis not present

## 2018-07-10 LAB — RENAL FUNCTION PANEL
Albumin: 3 g/dL — ABNORMAL LOW (ref 3.5–5.0)
Anion gap: 14 (ref 5–15)
BUN: 41 mg/dL — ABNORMAL HIGH (ref 6–20)
CO2: 27 mmol/L (ref 22–32)
Calcium: 9.4 mg/dL (ref 8.9–10.3)
Chloride: 94 mmol/L — ABNORMAL LOW (ref 98–111)
Creatinine, Ser: 7 mg/dL — ABNORMAL HIGH (ref 0.61–1.24)
GFR calc Af Amer: 9 mL/min — ABNORMAL LOW (ref >60)
GFR calc non Af Amer: 8 mL/min — ABNORMAL LOW (ref >60)
Glucose, Bld: 119 mg/dL — ABNORMAL HIGH (ref 70–99)
Phosphorus: 5.8 mg/dL — ABNORMAL HIGH (ref 2.5–4.6)
Potassium: 4.6 mmol/L (ref 3.5–5.1)
Sodium: 135 mmol/L (ref 135–145)

## 2018-07-10 LAB — CBC WITH DIFFERENTIAL/PLATELET
Abs Immature Granulocytes: 0 10*3/uL (ref 0.00–0.07)
Basophils Absolute: 0 10*3/uL (ref 0.0–0.1)
Basophils Relative: 1 %
Eosinophils Absolute: 0.1 10*3/uL (ref 0.0–0.5)
Eosinophils Relative: 2 %
HCT: 30.8 % — ABNORMAL LOW (ref 39.0–52.0)
Hemoglobin: 9.7 g/dL — ABNORMAL LOW (ref 13.0–17.0)
Immature Granulocytes: 0 %
Lymphocytes Relative: 23 %
Lymphs Abs: 0.8 10*3/uL (ref 0.7–4.0)
MCH: 31 pg (ref 26.0–34.0)
MCHC: 31.5 g/dL (ref 30.0–36.0)
MCV: 98.4 fL (ref 80.0–100.0)
Monocytes Absolute: 0.4 10*3/uL (ref 0.1–1.0)
Monocytes Relative: 12 %
Neutro Abs: 2.1 10*3/uL (ref 1.7–7.7)
Neutrophils Relative %: 62 %
Platelets: 132 10*3/uL — ABNORMAL LOW (ref 150–400)
RBC: 3.13 MIL/uL — ABNORMAL LOW (ref 4.22–5.81)
RDW: 15 % (ref 11.5–15.5)
WBC: 3.4 10*3/uL — ABNORMAL LOW (ref 4.0–10.5)
nRBC: 0 % (ref 0.0–0.2)

## 2018-07-10 LAB — TROPONIN I (HIGH SENSITIVITY)
Troponin I (High Sensitivity): 17 ng/L (ref <18)
Troponin I (High Sensitivity): 17 ng/L (ref <18)

## 2018-07-10 LAB — GLUCOSE, CAPILLARY: Glucose-Capillary: 122 mg/dL — ABNORMAL HIGH (ref 70–99)

## 2018-07-10 LAB — HEMOGLOBIN A1C
Hgb A1c MFr Bld: 5.3 % (ref 4.8–5.6)
Mean Plasma Glucose: 105.41 mg/dL

## 2018-07-10 LAB — BASIC METABOLIC PANEL
Anion gap: 14 (ref 5–15)
BUN: 41 mg/dL — ABNORMAL HIGH (ref 6–20)
CO2: 28 mmol/L (ref 22–32)
Calcium: 9.2 mg/dL (ref 8.9–10.3)
Chloride: 93 mmol/L — ABNORMAL LOW (ref 98–111)
Creatinine, Ser: 6.91 mg/dL — ABNORMAL HIGH (ref 0.61–1.24)
GFR calc Af Amer: 9 mL/min — ABNORMAL LOW (ref >60)
GFR calc non Af Amer: 8 mL/min — ABNORMAL LOW (ref >60)
Glucose, Bld: 123 mg/dL — ABNORMAL HIGH (ref 70–99)
Potassium: 4.5 mmol/L (ref 3.5–5.1)
Sodium: 135 mmol/L (ref 135–145)

## 2018-07-10 LAB — HEPATIC FUNCTION PANEL
ALT: 30 U/L (ref 0–44)
AST: 36 U/L (ref 15–41)
Albumin: 3 g/dL — ABNORMAL LOW (ref 3.5–5.0)
Alkaline Phosphatase: 77 U/L (ref 38–126)
Bilirubin, Direct: 0.3 mg/dL — ABNORMAL HIGH (ref 0.0–0.2)
Indirect Bilirubin: 0.7 mg/dL (ref 0.3–0.9)
Total Bilirubin: 1 mg/dL (ref 0.3–1.2)
Total Protein: 6.2 g/dL — ABNORMAL LOW (ref 6.5–8.1)

## 2018-07-10 LAB — HIV ANTIBODY (ROUTINE TESTING W REFLEX): HIV Screen 4th Generation wRfx: NONREACTIVE

## 2018-07-10 LAB — SARS CORONAVIRUS 2 BY RT PCR (HOSPITAL ORDER, PERFORMED IN ~~LOC~~ HOSPITAL LAB): SARS Coronavirus 2: NEGATIVE

## 2018-07-10 LAB — MRSA PCR SCREENING: MRSA by PCR: NEGATIVE

## 2018-07-10 MED ORDER — PENTAFLUOROPROP-TETRAFLUOROETH EX AERO: 1.0000 "application " | INHALATION_SPRAY | CUTANEOUS | Status: DC | PRN

## 2018-07-10 MED ORDER — TECHNETIUM TC 99M MEBROFENIN IV KIT
5.0000 | PACK | Freq: Once | INTRAVENOUS | Status: AC | PRN
  Administered 2018-07-10: 5 via INTRAVENOUS

## 2018-07-10 MED ORDER — AMLODIPINE BESYLATE 2.5 MG PO TABS
5.0000 mg | ORAL_TABLET | Freq: Every day | ORAL | Status: DC
  Administered 2018-07-10 – 2018-07-12 (×3): 5 mg via ORAL
  Filled 2018-07-10 (×3): qty 2

## 2018-07-10 MED ORDER — SODIUM CHLORIDE 0.9 % IV SOLN: 100.0000 mL | INTRAVENOUS | Status: DC | PRN

## 2018-07-10 MED ORDER — ACETAMINOPHEN 650 MG RE SUPP: 650.0000 mg | Freq: Four times a day (QID) | RECTAL | Status: DC | PRN

## 2018-07-10 MED ORDER — TRAMADOL HCL 50 MG PO TABS
50.0000 mg | ORAL_TABLET | Freq: Four times a day (QID) | ORAL | Status: DC | PRN
  Filled 2018-07-10: qty 1

## 2018-07-10 MED ORDER — FENTANYL CITRATE (PF) 100 MCG/2ML IJ SOLN
25.0000 ug | INTRAMUSCULAR | Status: AC | PRN
  Administered 2018-07-10 – 2018-07-11 (×2): 25 ug via INTRAVENOUS
  Filled 2018-07-10 (×2): qty 2

## 2018-07-10 MED ORDER — LIDOCAINE-PRILOCAINE 2.5-2.5 % EX CREA: 1.0000 "application " | TOPICAL_CREAM | CUTANEOUS | Status: DC | PRN

## 2018-07-10 MED ORDER — CARVEDILOL 12.5 MG PO TABS
12.5000 mg | ORAL_TABLET | Freq: Two times a day (BID) | ORAL | Status: DC
  Administered 2018-07-10 – 2018-07-12 (×4): 12.5 mg via ORAL
  Filled 2018-07-10 (×4): qty 1

## 2018-07-10 MED ORDER — DOXERCALCIFEROL 4 MCG/2ML IV SOLN
1.0000 ug | INTRAVENOUS | Status: DC
  Administered 2018-07-10 – 2018-07-12 (×2): 1 ug via INTRAVENOUS
  Filled 2018-07-10 (×2): qty 2

## 2018-07-10 MED ORDER — ASPIRIN EC 81 MG PO TBEC
81.0000 mg | DELAYED_RELEASE_TABLET | Freq: Every day | ORAL | Status: DC
  Administered 2018-07-10 – 2018-07-12 (×3): 81 mg via ORAL
  Filled 2018-07-10 (×3): qty 1

## 2018-07-10 MED ORDER — HEPARIN SODIUM (PORCINE) 1000 UNIT/ML DIALYSIS: 1000.0000 [IU] | INTRAMUSCULAR | Status: DC | PRN

## 2018-07-10 MED ORDER — LIDOCAINE HCL (PF) 1 % IJ SOLN: 5.0000 mL | INTRAMUSCULAR | Status: DC | PRN

## 2018-07-10 MED ORDER — ONDANSETRON HCL 4 MG/2ML IJ SOLN
4.0000 mg | Freq: Four times a day (QID) | INTRAMUSCULAR | Status: DC | PRN
  Administered 2018-07-11: 4 mg via INTRAVENOUS
  Filled 2018-07-10: qty 2

## 2018-07-10 MED ORDER — INSULIN ASPART 100 UNIT/ML ~~LOC~~ SOLN
0.0000 [IU] | Freq: Three times a day (TID) | SUBCUTANEOUS | Status: DC
  Administered 2018-07-11: 17:00:00 2 [IU] via SUBCUTANEOUS

## 2018-07-10 MED ORDER — ONDANSETRON HCL 4 MG PO TABS: 4.0000 mg | ORAL_TABLET | Freq: Four times a day (QID) | ORAL | Status: DC | PRN

## 2018-07-10 MED ORDER — ALTEPLASE 2 MG IJ SOLR: 2.0000 mg | Freq: Once | INTRAMUSCULAR | Status: DC | PRN

## 2018-07-10 MED ORDER — FENTANYL CITRATE (PF) 100 MCG/2ML IJ SOLN
50.0000 ug | Freq: Once | INTRAMUSCULAR | Status: AC
  Administered 2018-07-10: 50 ug via INTRAVENOUS
  Filled 2018-07-10: qty 2

## 2018-07-10 MED ORDER — SEVELAMER CARBONATE 800 MG PO TABS
2400.0000 mg | ORAL_TABLET | Freq: Three times a day (TID) | ORAL | Status: DC
  Administered 2018-07-10 – 2018-07-12 (×5): 2400 mg via ORAL
  Filled 2018-07-10 (×5): qty 3

## 2018-07-10 MED ORDER — HYDROXYZINE HCL 25 MG PO TABS
25.0000 mg | ORAL_TABLET | Freq: Three times a day (TID) | ORAL | Status: DC
  Administered 2018-07-10 – 2018-07-12 (×7): 25 mg via ORAL
  Filled 2018-07-10 (×7): qty 1

## 2018-07-10 MED ORDER — ACETAMINOPHEN 325 MG PO TABS: 650.0000 mg | ORAL_TABLET | Freq: Four times a day (QID) | ORAL | Status: DC | PRN

## 2018-07-10 MED ORDER — ORAL CARE MOUTH RINSE
15.0000 mL | Freq: Two times a day (BID) | OROMUCOSAL | Status: DC
  Administered 2018-07-10 – 2018-07-12 (×4): 15 mL via OROMUCOSAL

## 2018-07-10 MED ORDER — HYDROXYZINE HCL 25 MG PO TABS
25.0000 mg | ORAL_TABLET | Freq: Once | ORAL | Status: AC
  Administered 2018-07-10: 25 mg via ORAL
  Filled 2018-07-10: qty 1

## 2018-07-10 MED ORDER — TEMAZEPAM 15 MG PO CAPS
30.0000 mg | ORAL_CAPSULE | Freq: Every evening | ORAL | Status: DC | PRN
  Administered 2018-07-10 – 2018-07-11 (×3): 30 mg via ORAL
  Filled 2018-07-10 (×3): qty 2

## 2018-07-10 MED ORDER — DOXERCALCIFEROL 4 MCG/2ML IV SOLN
INTRAVENOUS | Status: AC
  Filled 2018-07-10: qty 2

## 2018-07-10 MED ORDER — CHLORHEXIDINE GLUCONATE CLOTH 2 % EX PADS: 6.0000 | MEDICATED_PAD | Freq: Every day | CUTANEOUS | Status: DC

## 2018-07-10 MED ORDER — DOXYCYCLINE HYCLATE 100 MG PO TABS
100.0000 mg | ORAL_TABLET | Freq: Two times a day (BID) | ORAL | Status: DC
  Administered 2018-07-10 – 2018-07-12 (×6): 100 mg via ORAL
  Filled 2018-07-10 (×6): qty 1

## 2018-07-10 MED ORDER — HEPARIN SODIUM (PORCINE) 1000 UNIT/ML DIALYSIS: 4000.0000 [IU] | Freq: Once | INTRAMUSCULAR | Status: DC

## 2018-07-10 NOTE — H&P (Signed)
History and Physical    Matthew Kline GYI:[id number] DOB: [birth year] DOA: 07/09/2018  PCP: Patient, No Pcp Per  Patient coming from: Home.  Chief Complaint: Abdominal pain.  HPI: Matthew Kline is a 58 y.o. male with history of ESRD on hemodialysis on Monday Wednesday Friday, CAD status post CABG, hypertension, diabetes mellitus type 2 on diet, anemia, inguinal hernia recently evaluated by surgeon presents to the ER with complaint of abdominal pain mostly in the upper quadrants last 3 days which is been progressive worsening with nausea but no vomiting.  Denies any chest pain or shortness of breath.  Denies any diarrhea.  Over the last 3 days patient has eaten minimally because of the pain and nausea.  Patient was recently placed on doxycycline 2 weeks ago for folliculitis involving the whole body by patient's dermatologist.  ED Course: In the ER patient had CT abdomen pelvis without contrast followed by CT angiogram of the abdomen and pelvis which shows nonobstructive inguinal hernia moderate ascites and gallstones.  Also there was pleural effusion and body wall edema.  While in the ER patient was mildly hypoxic requiring oxygen.  Patient admitted for abdominal pain and also need dialysis.  Labs show hemoglobin of 10.2 platelets 139 potassium 4.7.  Lipase was 24.  Review of Systems: As per HPI, rest all negative.   Past Medical History:  Diagnosis Date   Anemia, unspecified    Anxiety    Arthralgia 2010   polyarticular   Arthritis    "back, knees" (01/10/2017)   Cancer ([place])    "kidney area" (01/10/2017)   CHF (congestive heart failure) ([place]) 07/25/2009   denies   Chronic lower back pain    Coronary artery disease    Coughing    pt. 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   RUE/RLE, speech   Long term (current) use of anticoagulants    Myocardial infarction ([place]) 1995   Orthostatic hypotension    Osteomyelitis of foot, left, acute ([place])    Other chronic postoperative pain    Pneumonia    "probably twice" (01/10/2017)   Polymyalgia rheumatica ([place])    Renal insufficiency    Sleep apnea    "lost weight; no more problem" (01/10/2017)   Stroke ([place]) 01/10/06   denies residual on 05/09/2014   Type II diabetes mellitus ([place]) dx'd 1995   Unspecified hereditary and idiopathic peripheral neuropathy    feet   Unspecified osteomyelitis, site unspecified    Unspecified vitamin D deficiency     Past Surgical History:  Procedure Laterality Date   ABDOMINAL AORTOGRAM N/A 08/25/2016   Procedure: ABDOMINAL AORTOGRAM;  Surgeon: Wellington Hampshire, MD;  Location: [facility];  Service: Cardiovascular;  Laterality: N/A;   AMPUTATION  01/21/2012   Procedure: AMPUTATION RAY;  Surgeon: [name], MD;  Location: [facility];  Service: Orthopedics;  Laterality: Left;  Left Foot 4th Ray Amputation   AMPUTATION Left 05/04/2013   Procedure: AMPUTATION DIGIT;  Surgeon: [name], MD;  Location: [facility];  Service: Orthopedics;  Laterality: Left;  Left Great Toe Amputation at [facility]   AMPUTATION Left 01/14/2017   Procedure: AMPUTATION ABOVE LEFT KNEE;  Surgeon: [name], MD;  Location: [facility];  Service: Orthopedics;  Laterality: Left;   ANTERIOR CERVICAL DECOMP/DISCECTOMY FUSION  02/2011   BACK SURGERY     BASCILIC VEIN TRANSPOSITION Left 10/19/2012   Procedure: BASCILIC VEIN TRANSPOSITION;  Surgeon: Serafina Mitchell,  MD;  Location: [facility];  Service: Vascular;  Laterality: Left;   CARDIAC CATHETERIZATION     "before bypass"   CORONARY ARTERY BYPASS GRAFT     x 5 with lima at [place] Left 08/07/2014   Procedure: Replace Left Total Knee Arthroplasty,  Place Antibiotic Spacer;  Surgeon: [name], MD;  Location: [facility];  Service: Orthopedics;  Laterality: Left;   I&D EXTREMITY Left 05/09/2014   Procedure: Irrigation and Debridement Left Knee and Closure of Total Knee Arthroplasty Incision;  Surgeon: [name], MD;  Location: [facility];  Service: Orthopedics;  Laterality: Left;   I&D KNEE WITH POLY EXCHANGE Left 05/31/2014   Procedure: IRRIGATION AND DEBRIDEMENT LEFT KNEE, PLACE ANTIBIOTIC BEADS,  POLY EXCHANGE;  Surgeon: [name], MD;  Location: [facility];  Service: Orthopedics;  Laterality: Left;   IRRIGATION AND DEBRIDEMENT KNEE Left 01/12/2017   Procedure: IRRIGATION AND DEBRIDEMENT LEFT KNEE;  Surgeon: [name], MD;  Location: [facility];  Service: Orthopedics;  Laterality: Left;   JOINT REPLACEMENT     KNEE ARTHROSCOPY Left 08-25-2012   LOWER EXTREMITY ANGIOGRAPHY Left 08/25/2016   Procedure: Lower Extremity Angiography;  Surgeon: Wellington Hampshire, MD;  Location: [facility];  Service: Cardiovascular;  Laterality: Left;   PERIPHERAL VASCULAR BALLOON ANGIOPLASTY Left 08/25/2016   Procedure: PERIPHERAL VASCULAR BALLOON ANGIOPLASTY;  Surgeon: Wellington Hampshire, MD;  Location: [facility];  Service: Cardiovascular;  Laterality: Left;  lt peroneal and ant tibial arteries cutting balloon   REFRACTIVE SURGERY Bilateral    TOE AMPUTATION Bilateral    "I've lost 7 toes over the last 7 years" (05/09/2014)   TOE SURGERY Left April 2015   Big toe removed on left foot.   TONSILLECTOMY     TOTAL KNEE ARTHROPLASTY Left 04/10/2014   Procedure: TOTAL KNEE ARTHROPLASTY;  Surgeon: [name], MD;  Location: [facility];  Service:  Orthopedics;  Laterality: Left;   TOTAL KNEE REVISION Left 10/25/2014   Procedure: LEFT TOTAL KNEE REVISION;  Surgeon: [name], MD;  Location: [facility];  Service: Orthopedics;  Laterality: Left;   TOTAL KNEE REVISION Left 11/26/2015   Procedure: Removal Left Total Knee Arthroplasty, Hinged Total Knee Arthroplasty;  Surgeon: [name], MD;  Location: [facility];  Service: Orthopedics;  Laterality: Left;   UVULOPALATOPHARYNGOPLASTY, TONSILLECTOMY AND SEPTOPLASTY  ~ [place] Left 05/09/2014   Dehiscence Left Total Knee Arthroplasty Incision     reports that he quit smoking about 21 months ago. His smoking use included cigarettes. He has a 3.84 pack-year smoking history. He has never used smokeless tobacco. He reports that he does not drink alcohol or use drugs.  Allergies  Allergen Reactions   Morphine And Related Other (See Comments)    hallucinations   Tygacil [Tigecycline] Nausea And Vomiting and Other (See  Comments)    Makes him feel crazy    Family History  Problem Relation Age of Onset   Hypertension Mother    Cancer Mother 31       Ovarian   Heart disease Maternal Aunt    Stroke Maternal Grandfather     Prior to Admission medications   Medication Sig Start Date End Date Taking? Authorizing Provider  amLODipine (NORVASC) 5 MG tablet Take 5 mg by mouth daily.    Yes [provider]  doxycycline (VIBRA-TABS) 100 MG tablet Take 100 mg by mouth 2 (two) times daily.   Yes [provider]  hydrOXYzine (ATARAX/VISTARIL) 25 MG tablet Take 25 mg by mouth 3 (three) times daily.   Yes [provider]  sevelamer carbonate (RENVELA) 800 MG tablet Take 2,400 mg by mouth 3 (three) times daily with meals.    Yes [provider]  temazepam (RESTORIL) 30 MG capsule Take 30 mg by mouth at bedtime as needed for sleep.   Yes [provider]  Amino Acids-Protein Hydrolys (FEEDING SUPPLEMENT, PRO-STAT SUGAR FREE 64,) LIQD Take 30  mLs by mouth 2 (two) times daily. Patient not taking: Reported on 11/30/2017 01/19/17   Patrecia Pour, Christean Grief, MD  aspirin EC 81 MG EC tablet Take 2 tablets (162 mg total) by mouth daily. Patient not taking: Reported on 11/30/2017 01/20/17   Patrecia Pour, Christean Grief, MD  carvedilol (COREG) 12.5 MG tablet Take 1 tablet (12.5 mg total) by mouth 2 (two) times daily with a meal. Patient not taking: Reported on 07/10/2018 02/08/17   Bonnell Public, MD  Darbepoetin Alfa (ARANESP) 150 MCG/0.3ML SOSY injection Inject 0.3 mLs (150 mcg total) into the vein every Friday with hemodialysis. Patient not taking: Reported on 07/10/2018 02/11/17   Dana Allan I, MD  ferric citrate (AURYXIA) 1 GM 210 MG(Fe) tablet Take 2 tablets (420 mg total) by mouth 3 (three) times daily with meals. Patient not taking: Reported on 11/30/2017 02/08/17   Dana Allan I, MD  gabapentin (NEURONTIN) 300 MG capsule TAKE 1 CAPSULE BY MOUTH THREE TIMES A DAY Patient not taking: No sig reported 10/24/17   [name], MD  montelukast (SINGULAIR) 10 MG tablet TAKE 1 TABLET (10 MG TOTAL) BY MOUTH AT BEDTIME. Patient not taking: No sig reported 09/01/15   Gildardo Cranker, DO  multivitamin (RENA-VIT) TABS tablet Take 1 tablet by mouth at bedtime. Patient not taking: Reported on 07/10/2018 02/08/17   Bonnell Public, MD  Nutritional Supplements (FEEDING SUPPLEMENT, NEPRO CARB STEADY,) LIQD Take 237 mLs by mouth 2 (two) times daily between meals. Patient not taking: Reported on 11/30/2017 01/19/17   Patrecia Pour, Christean Grief, MD  ondansetron (ZOFRAN ODT) 4 MG disintegrating tablet Take 1 tablet (4 mg total) by mouth every 8 (eight) hours as needed for nausea or vomiting. Patient not taking: Reported on 07/10/2018 12/16/17   Kinnie Feil, PA-C  pentoxifylline (TRENTAL) 400 MG CR tablet TAKE 1 TABLET (400 MG TOTAL) BY MOUTH 3 (THREE) TIMES DAILY WITH MEALS. Patient not taking: Reported on 07/10/2018 02/22/18   [name], MD  traMADol  (ULTRAM) 50 MG tablet Take 1 tablet (50 mg total) by mouth every 6 (six) hours as needed. Patient not taking: Reported on 07/10/2018 12/16/17   Kinnie Feil, [place]    Physical Exam: Vitals:   07/09/18 2146 07/09/18 2200 07/09/18 2230 07/09/18 2300  BP: (!) 150/86 (!) 156/98 (!) 151/100 138/80  Pulse: 73 75 74 74  Resp:  16  Temp:      TempSrc:      SpO2: 96% 96% 90% 98%      Constitutional: Moderately built and nourished. Vitals:   07/09/18 2146 07/09/18 2200 07/09/18 2230 07/09/18 2300  BP: (!) 150/86 (!) 156/98 (!) 151/100 138/80  Pulse: 73 75 74 74  Resp:    16  Temp:      TempSrc:      SpO2: 96% 96% 90% 98%   Eyes: Anicteric no pallor. ENMT: No discharge from the ears eyes nose or mouth. Neck: No mass felt.  No neck rigidity. Respiratory: No rhonchi or crepitations. Cardiovascular: S1-S2 heard. Abdomen: Mild tenderness of the both right and left upper quadrant and epigastric area.  Otherwise abdomen is soft no rebound tenderness no guarding rigidity has right and left inguinal area which looks large.  Nonobstructed. Musculoskeletal: No edema.  Left AKA. Skin: No rash. Neurologic: Alert awake oriented to time place and person.  Moves all extremities. Psychiatric: Appears normal per normal affect.   Labs on Admission: I have personally reviewed following labs and imaging studies  CBC: Recent Labs  Lab 07/09/18 1800  WBC 3.9*  NEUTROABS 2.2  HGB 10.2*  HCT 32.7*  MCV 100.3*  PLT 147*   Basic Metabolic Panel: Recent Labs  Lab 07/09/18 1800  NA 139  K 4.7  CL 97*  CO2 29  GLUCOSE 111*  BUN 36*  CREATININE 6.74*  CALCIUM 9.9   GFR: CrCl cannot be calculated (Unknown ideal weight.). Liver Function Tests: Recent Labs  Lab 07/09/18 1800  AST 25  ALT 20  ALKPHOS 84  BILITOT 1.1  PROT 6.7  ALBUMIN 3.3*   Recent Labs  Lab 07/09/18 1800  LIPASE 24   No results for input(s): AMMONIA in the last 168 hours. Coagulation Profile: No results  for input(s): INR, PROTIME in the last 168 hours. Cardiac Enzymes: No results for input(s): CKTOTAL, CKMB, CKMBINDEX, TROPONINI in the last 168 hours. BNP (last 3 results) No results for input(s): PROBNP in the last 8760 hours. HbA1C: No results for input(s): HGBA1C in the last 72 hours. CBG: No results for input(s): GLUCAP in the last 168 hours. Lipid Profile: No results for input(s): CHOL, HDL, LDLCALC, TRIG, CHOLHDL, LDLDIRECT in the last 72 hours. Thyroid Function Tests: No results for input(s): TSH, T4TOTAL, FREET4, T3FREE, THYROIDAB in the last 72 hours. Anemia Panel: No results for input(s): VITAMINB12, FOLATE, FERRITIN, TIBC, IRON, RETICCTPCT in the last 72 hours. Urine analysis:    Component Value Date/Time   COLORURINE YELLOW 01/29/2017 1141   APPEARANCEUR CLOUDY (A) 01/29/2017 1141   APPEARANCEUR Clear 07/12/2013 0918   LABSPEC 1.011 01/29/2017 1141   PHURINE 8.0 01/29/2017 1141   GLUCOSEU 50 (A) 01/29/2017 1141   HGBUR SMALL (A) 01/29/2017 1141   BILIRUBINUR NEGATIVE 01/29/2017 1141   BILIRUBINUR Negative 07/12/2013 0918   KETONESUR NEGATIVE 01/29/2017 1141   PROTEINUR 100 (A) 01/29/2017 1141   UROBILINOGEN 0.2 01/03/2012 1335   NITRITE NEGATIVE 01/29/2017 1141   LEUKOCYTESUR LARGE (A) 01/29/2017 1141   LEUKOCYTESUR Negative 07/12/2013 0918   Sepsis Labs: @LABRCNTIP (procalcitonin:4,lacticidven:4) )No results found for this or any previous visit (from the past 240 hour(s)).   Radiological Exams on Admission: Ct Abdomen Pelvis Wo Contrast  Result Date: 07/09/2018 CLINICAL DATA:  Abdominal pain and vomiting 2 days. Bowel obstruction. Inguinal hernia. EXAM: CT ABDOMEN AND PELVIS WITHOUT CONTRAST TECHNIQUE: Multidetector CT imaging of the abdomen and pelvis was performed following the standard protocol without IV  contrast. COMPARISON:  01/02/2018 FINDINGS: Lower chest: New small bilateral pleural effusions and bibasilar atelectasis. Hepatobiliary: No mass visualized on  this unenhanced exam. Tiny calcified gallstones are again seen, without definite evidence of cholecystitis. Pancreas: No mass or inflammatory process visualized on this unenhanced exam. Spleen:  Within normal limits in size. Adrenals/Urinary tract: Stable bilateral diffuse renal parenchymal atrophy. No evidence of renal masses or hydronephrosis. Diffuse renal vascular calcification noted. Stomach/Bowel: No evidence of obstruction or focal inflammatory process. A moderate size right inguinal hernia is seen containing several small bowel loops, however there is no evidence of obstruction or strangulation. A small left inguinal hernia is seen containing only fat. Vascular/Lymphatic: No pathologically enlarged lymph nodes identified. No evidence of abdominal aortic aneurysm. Aortic atherosclerosis. Reproductive:  No mass or other significant abnormality. Other: Mild ascites is new since previous study. Diffuse mesenteric and body wall edema is also new since prior exam. Musculoskeletal:  No suspicious bone lesions identified. IMPRESSION: 1. New mild ascites, diffuse mesenteric and body wall edema, and small bilateral pleural effusions, consistent with 3rd spacing. No focal inflammatory process or mass identified. 2. Increased sizes of right inguinal hernia containing several small bowel loops, and left inguinal hernia containing only fat. No evidence of bowel obstruction or strangulation. 3. Cholelithiasis. No radiographic evidence of cholecystitis. Electronically Signed   By: Marlaine Hind M.D.   On: 07/09/2018 21:13   Dg Chest Portable 1 View  Result Date: 07/09/2018 CLINICAL DATA:  Pleural effusions, history CHF, coronary artery disease post MI, diabetes mellitus, end-stage renal disease on dialysis, hypertension EXAM: PORTABLE CHEST 1 VIEW COMPARISON:  Portable exam 2047 hours compared to 02/15/2018 FINDINGS: Lordotic positioning. Enlargement of cardiac silhouette with pulmonary vascular congestion post median  sternotomy. LEFT basilar atelectasis. Lungs otherwise clear. No definite infiltrate, pleural effusion or pneumothorax. Bones demineralized. IMPRESSION: Enlargement of cardiac silhouette with pulmonary vascular congestion. LEFT basilar atelectasis. Electronically Signed   By: Lavonia Dana M.D.   On: 07/09/2018 23:01   Ct Angio Abd/pel W And/or Wo Contrast  Result Date: 07/09/2018 CLINICAL DATA:  Abdominal pain.  Assess for mesenteric ischemia EXAM: CTA ABDOMEN AND PELVIS wITHOUT AND WITH CONTRAST TECHNIQUE: Multidetector CT imaging of the abdomen and pelvis was performed using the standard protocol during bolus administration of intravenous contrast. Multiplanar reconstructed images and MIPs were obtained and reviewed to evaluate the vascular anatomy. CONTRAST:  147mL OMNIPAQUE IOHEXOL 350 MG/ML SOLN COMPARISON:  Noncontrast study performed earlier today. FINDINGS: VASCULAR Aorta: Heavily calcified.  No aneurysm or dissection. Celiac: Calcified, widely patent SMA: Calcified, widely patent Renals: Heavily calcified bilaterally. Probable hemodynamically significant stenosis within the proximal left renal artery, greater than 50%. IMA: Calcified with mild stenosis at the origin, likely approximately 50%. Inflow: Heavily calcified.  No aneurysm. Proximal Outflow: Heavily calcified. Vessels are poorly opacified which appears to be due to slow flow rather than focal stenosis. Veins: Grossly unremarkable. Review of the MIP images confirms the above findings. NON-VASCULAR Lower chest: Small to moderate bilateral pleural effusions. There is cardiomegaly. Bilateral lower lobe airspace opacities. Hepatobiliary: Small gallstones within the gallbladder. No focal hepatic abnormality. Pancreas: No focal abnormality or ductal dilatation. Spleen: No focal abnormality.  Normal size. Adrenals/Urinary Tract: Atrophic kidneys compatible with end-stage renal disease. No adrenal mass. Urinary bladder is decompressed, grossly  unremarkable. Stomach/Bowel: Stomach, large and small bowel grossly unremarkable. Lymphatic: No adenopathy Reproductive: Moderate free fluid in the cul-de-sac and adjacent to the liver and spleen. Other: Bilateral inguinal hernias. The right inguinal hernia contains small  bowel loops. No evidence of obstruction. Left inguinal hernia contains fat. Musculoskeletal: No acute bony abnormality. IMPRESSION: VASCULAR Heavily calcified aorta and branch vessels. Mild stenosis at the origin of the IMA. Celiac and SMA appear widely patent. Heavily calcified renal arteries bilaterally likely with hemodynamically significant stenosis within the proximal left renal artery. NON-VASCULAR Cholelithiasis. Moderate ascites. Bilateral inguinal hernias, containing small bowel on the right and fat on the left. No evidence of bowel obstruction. Electronically Signed   By: Rolm Baptise M.D.   On: 07/09/2018 23:42     Assessment/Plan Active Problems:   Polymyalgia rheumatica (HCC)   DM type 2 causing vascular disease ([place])   ESRD on dialysis ([place])   Type II diabetes mellitus ([place])   Anemia in chronic kidney disease   CAD, multiple vessel   Acute respiratory failure with hypoxia (HCC)   Abdominal pain    1. Abdominal pain with gallstones and also inguinal hernia -CT scan does not show any obstruction of the hernia.  On exam patient is tender in epigastric and both right and left upper quadrants.  Will get HIDA scan.  If nausea and pain persist may get a surgical opinion in the morning.  May need paracentesis for the ascites for until then we will keep patient on empiric antibiotics. 2. ESRD on hemodialysis Monday Wednesday Friday please consult nephrology for dialysis patient is not in distress. 3. Hypertension on amlodipine and Coreg. 4. CAD status post CABG on aspirin Coreg. 5. Anemia likely from ESRD follow CBC. 6. Diabetes mellitus type 2 on diet. 7. Recently diagnosed for colitis on  doxycycline. 8. Thrombocytopenia -reviewing labs in 2018 patient did have thrombocytopenia.  Follow CBC.   DVT prophylaxis: SCDs. Code Status: Full code. Family Communication: Discussed with patient. Disposition Plan: Home. Consults called: None. Admission status: Observation.   Rise Patience MD [facility] Pager [phone].  If 7PM-7AM, please contact night-coverage [email or web address] Password [email or web address]  07/10/2018, 1:35 AM

## 2018-07-10 NOTE — Progress Notes (Signed)
Patient complaining of abdominal pain 9/10 after eating.  He stated "it feels like someone punched me in the gut".  Patient refusing Tylenol as he stated it does not help.  Patient also stated it is not a "GI" pain.  Patient seems very discouraged about his condition.  Paged [facility] for suggestions.

## 2018-07-10 NOTE — Progress Notes (Signed)
Matthew Kline is a 58 Y/O male with ESRD on hemodialysis MWF at [facility]. He has been admitted as observation patient for abdominal pain. We will manage dialysis and consult formally if status updated to in-patient. H/O noncompliance with HD. Last HD 07/07/18-2:43 hrs left 1.4 kg over EDW.    HD orders: MWF [place] 4 hrs 180NRe 400/Autoflow 2.0 72 kg 2.0 K/2.0 Ca UFP 4 -Heparin 4000 units IV TIW -Mircera 100 mcg IV q 2 week (last dose 07/04/18) -Venofer 50 mg IV weekly   Juanell Fairly NP-[facility] [place] [phone] (Pager)

## 2018-07-10 NOTE — ED Notes (Signed)
ED TO INPATIENT HANDOFF REPORT  ED Nurse Name and Phone #: Ben [phone]  S Name/Age/Gender Matthew Kline 58 y.o. male Room/Bed: TRACC/TRACC  Code Status   Code Status: Prior  Home/SNF/Other Home Patient oriented to: self, place, time and situation Is this baseline? Yes   Triage Complete: Triage complete  Chief Complaint abd pain  Triage Note Pt here for abdominal pain x 3 days. States it is progressive. Pt has hx of hernia in lower quad. Denies fever, SOB, cough. Pt is dialysis pt, has not missed any treatments   Allergies Allergies  Allergen Reactions  . Morphine And Related Other (See Comments)    hallucinations  . Tygacil [Tigecycline] Nausea And Vomiting and Other (See Comments)    Makes him feel crazy    Level of Care/Admitting Diagnosis ED Disposition    ED Disposition Condition [place]: [place] [[id number]]  Level of Care: Telemetry Medical [104]  I expect the patient will be discharged within 24 hours: No (not a candidate for 5C-Observation unit)  Covid Evaluation: Screening Protocol (No Symptoms)  Diagnosis: Abdominal pain [009381]  Admitting Physician: Rise Patience [phone]  Attending Physician: Rise Patience [[id number]  PT Class (Do Not Modify): Observation [104]  PT Acc Code (Do Not Modify): Observation [10022]       B Medical/Surgery History Past Medical History:  Diagnosis Date  . Anemia, unspecified   . Anxiety   . Arthralgia 2010   polyarticular  . Arthritis    "back, knees" (01/10/2017)  . Cancer [facility])    "kidney area" (01/10/2017)  . CHF (congestive heart failure) ([place]) 07/25/2009   denies  . Chronic lower back pain   . Coronary artery disease   . Coughing    pt. reports that he has drainage from sinus infection  . Diabetic foot ulcer ([place])   . Diabetic neuropathy ([place])   . ESRD (end stage renal disease) on dialysis [facility])    started 12/2012; "MWF; [place] "  (01/10/2017)   . GERD (gastroesophageal reflux disease)    hx "before I lost weight", no problem 9 years  . Hemodialysis access site with mature fistula ([place])   . Hemorrhoids, internal 10/2011   small  . High cholesterol   . History of blood transfusion    "related to the anemia"  . Hypertension   . Insomnia, unspecified   . Lacunar infarction ([place]) 2006   RUE/RLE, speech  . Long term (current) use of anticoagulants   . Myocardial infarction ([place]) 1995  . Orthostatic hypotension   . Osteomyelitis of foot, left, acute ([place])   . Other chronic postoperative pain   . Pneumonia    "probably twice" (01/10/2017)  . Polymyalgia rheumatica ([place])   . Renal insufficiency   . Sleep apnea    "lost weight; no more problem" (01/10/2017)  . Stroke ([place]) 01/10/06   denies residual on 05/09/2014  . Type II diabetes mellitus ([place]) dx'd 1995  . Unspecified hereditary and idiopathic peripheral neuropathy    feet  . Unspecified osteomyelitis, site unspecified   . Unspecified vitamin D deficiency    Past Surgical History:  Procedure Laterality Date  . ABDOMINAL AORTOGRAM N/A 08/25/2016   Procedure: ABDOMINAL AORTOGRAM;  Surgeon: Wellington Hampshire, MD;  Location: [place] [facility];  Service: Cardiovascular;  Laterality: N/A;  . AMPUTATION  01/21/2012   Procedure: AMPUTATION RAY;  Surgeon: [name], MD;  Location: [place];  Service: Orthopedics;  Laterality: Left;  Left Foot 4th Ray Amputation  . AMPUTATION Left 05/04/2013   Procedure: AMPUTATION DIGIT;  Surgeon: [name], MD;  Location: [place];  Service: Orthopedics;  Laterality: Left;  Left Great Toe Amputation at [facility]  . AMPUTATION Left 01/14/2017   Procedure: AMPUTATION ABOVE LEFT KNEE;  Surgeon: [name], MD;  Location: [place];  Service: Orthopedics;  Laterality: Left;  . ANTERIOR CERVICAL DECOMP/DISCECTOMY FUSION  02/2011  . BACK SURGERY    . BASCILIC VEIN TRANSPOSITION Left 10/19/2012   Procedure: BASCILIC VEIN TRANSPOSITION;  Surgeon: Serafina Mitchell, MD;  Location: [place];  Service: Vascular;  Laterality: Left;  . CARDIAC CATHETERIZATION     "before bypass"  . CORONARY ARTERY BYPASS GRAFT     x 5 with lima at [place] WITH ANTIBIOTIC SPACERS Left 08/07/2014   Procedure: Replace Left Total Knee Arthroplasty,  Place Antibiotic Spacer;  Surgeon: [name], MD;  Location: [place];  Service: Orthopedics;  Laterality: Left;  . I&D EXTREMITY Left 05/09/2014   Procedure: Irrigation and Debridement Left Knee and Closure of Total Knee Arthroplasty Incision;  Surgeon: [name], MD;  Location: [place];  Service: Orthopedics;  Laterality: Left;  . I&D KNEE WITH POLY EXCHANGE Left 05/31/2014   Procedure: IRRIGATION AND DEBRIDEMENT LEFT KNEE, PLACE ANTIBIOTIC BEADS,  POLY EXCHANGE;  Surgeon: [name], MD;  Location: [place];  Service: Orthopedics;  Laterality: Left;  . IRRIGATION AND DEBRIDEMENT KNEE Left 01/12/2017   Procedure: IRRIGATION AND DEBRIDEMENT LEFT KNEE;  Surgeon: [name], MD;  Location: [place];  Service: Orthopedics;  Laterality: Left;  . JOINT REPLACEMENT    . KNEE ARTHROSCOPY Left 08-25-2012  . LOWER EXTREMITY ANGIOGRAPHY Left 08/25/2016   Procedure: Lower Extremity Angiography;  Surgeon: Wellington Hampshire, MD;  Location: [place] [facility];  Service: Cardiovascular;  Laterality: Left;  . PERIPHERAL VASCULAR BALLOON ANGIOPLASTY Left 08/25/2016   Procedure: PERIPHERAL VASCULAR BALLOON ANGIOPLASTY;  Surgeon: Wellington Hampshire, MD;  Location: [place] [facility];  Service: Cardiovascular;  Laterality: Left;  lt peroneal and ant tibial arteries cutting balloon  . REFRACTIVE SURGERY Bilateral   . TOE AMPUTATION Bilateral    "I've lost 7 toes over the last 7 years" (05/09/2014)  . TOE SURGERY Left April 2015   Big toe removed on left foot.  . TONSILLECTOMY    . TOTAL KNEE ARTHROPLASTY Left 04/10/2014   Procedure: TOTAL KNEE ARTHROPLASTY;  Surgeon: [name], MD;  Location: [place];   Service: Orthopedics;  Laterality: Left;  . TOTAL KNEE REVISION Left 10/25/2014   Procedure: LEFT TOTAL KNEE REVISION;  Surgeon: [name], MD;  Location: [place];  Service: Orthopedics;  Laterality: Left;  . TOTAL KNEE REVISION Left 11/26/2015   Procedure: Removal Left Total Knee Arthroplasty, Hinged Total Knee Arthroplasty;  Surgeon: [name], MD;  Location: [place];  Service: Orthopedics;  Laterality: Left;  . UVULOPALATOPHARYNGOPLASTY, TONSILLECTOMY AND SEPTOPLASTY  ~ 1989  . WOUND DEBRIDEMENT Left 05/09/2014   Dehiscence Left Total Knee Arthroplasty Incision     A IV Location/Drains/Wounds Patient Lines/Drains/Airways Status   Active Line/Drains/Airways    Name:   Placement date:   Placement time:   Site:   Days:   Peripheral IV 07/09/18 Right Antecubital   07/09/18    1804    Antecubital   1   Fistula / Graft Left Upper arm Arteriovenous fistula   -    -  Upper arm      Incision (Closed) 01/14/17 Leg Left   01/14/17    1253     542   Pressure Injury 01/29/17 Stage I -  Intact skin with non-blanchable redness of a localized area usually over a bony prominence.   01/29/17    2235     527          Intake/Output Last 24 hours No intake or output data in the 24 hours ending 07/10/18 0151  Labs/Imaging Results for orders placed or performed during the hospital encounter of 07/09/18 (from the past 48 hour(s))  CBC with Differential     Status: Abnormal   Collection Time: 07/09/18  6:00 PM  Result Value Ref Range   WBC 3.9 (L) 4.0 - 10.5 K/uL   RBC 3.26 (L) 4.22 - 5.81 MIL/uL   Hemoglobin 10.2 (L) 13.0 - 17.0 g/dL   HCT 32.7 (L) 39.0 - 52.0 %   MCV 100.3 (H) 80.0 - 100.0 fL   MCH 31.3 26.0 - 34.0 pg   MCHC 31.2 30.0 - 36.0 g/dL   RDW 15.2 11.5 - 15.5 %   Platelets 139 (L) 150 - 400 K/uL    Comment: REPEATED TO VERIFY   nRBC 0.0 0.0 - 0.2 %   Neutrophils Relative % 56 %   Neutro Abs 2.2 1.7 - 7.7 K/uL   Lymphocytes Relative 28 %   Lymphs Abs 1.1 0.7 - 4.0 K/uL    Monocytes Relative 13 %   Monocytes Absolute 0.5 0.1 - 1.0 K/uL   Eosinophils Relative 2 %   Eosinophils Absolute 0.1 0.0 - 0.5 K/uL   Basophils Relative 1 %   Basophils Absolute 0.0 0.0 - 0.1 K/uL   Immature Granulocytes 0 %   Abs Immature Granulocytes 0.01 0.00 - 0.07 K/uL    Comment: Performed at [facility], [address], [place], [place] [address]  Comprehensive metabolic panel     Status: Abnormal   Collection Time: 07/09/18  6:00 PM  Result Value Ref Range   Sodium 139 135 - 145 mmol/L   Potassium 4.7 3.5 - 5.1 mmol/L   Chloride 97 (L) 98 - 111 mmol/L   CO2 29 22 - 32 mmol/L   Glucose, Bld 111 (H) 70 - 99 mg/dL   BUN 36 (H) 6 - 20 mg/dL   Creatinine, Ser 6.74 (H) 0.61 - 1.24 mg/dL   Calcium 9.9 8.9 - 10.3 mg/dL   Total Protein 6.7 6.5 - 8.1 g/dL   Albumin 3.3 (L) 3.5 - 5.0 g/dL   AST 25 15 - 41 U/L   ALT 20 0 - 44 U/L   Alkaline Phosphatase 84 38 - 126 U/L   Total Bilirubin 1.1 0.3 - 1.2 mg/dL   GFR calc non Af Amer 8 (L) >60 mL/min   GFR calc Af Amer 9 (L) >60 mL/min   Anion gap 13 5 - 15    Comment: Performed at [facility], [place] [address], [place], [place] [address]  Lipase, blood     Status: None   Collection Time: 07/09/18  6:00 PM  Result Value Ref Range   Lipase 24 11 - 51 U/L    Comment: Performed at  [address] [place], [place] [address]   Ct Abdomen Pelvis Wo Contrast  Result Date: 07/09/2018 CLINICAL DATA:  Abdominal pain and vomiting 2 days. Bowel obstruction. Inguinal hernia. EXAM: CT ABDOMEN AND PELVIS WITHOUT CONTRAST TECHNIQUE: Multidetector  CT imaging of the abdomen and pelvis was performed following the standard protocol without IV contrast. COMPARISON:  01/02/2018 FINDINGS: Lower chest: New small bilateral pleural effusions and bibasilar atelectasis. Hepatobiliary: No mass visualized on this unenhanced exam. Tiny calcified gallstones are again seen, without definite evidence of cholecystitis. Pancreas: No mass or  inflammatory process visualized on this unenhanced exam. Spleen:  Within normal limits in size. Adrenals/Urinary tract: Stable bilateral diffuse renal parenchymal atrophy. No evidence of renal masses or hydronephrosis. Diffuse renal vascular calcification noted. Stomach/Bowel: No evidence of obstruction or focal inflammatory process. A moderate size right inguinal hernia is seen containing several small bowel loops, however there is no evidence of obstruction or strangulation. A small left inguinal hernia is seen containing only fat. Vascular/Lymphatic: No pathologically enlarged lymph nodes identified. No evidence of abdominal aortic aneurysm. Aortic atherosclerosis. Reproductive:  No mass or other significant abnormality. Other: Mild ascites is new since previous study. Diffuse mesenteric and body wall edema is also new since prior exam. Musculoskeletal:  No suspicious bone lesions identified. IMPRESSION: 1. New mild ascites, diffuse mesenteric and body wall edema, and small bilateral pleural effusions, consistent with 3rd spacing. No focal inflammatory process or mass identified. 2. Increased sizes of right inguinal hernia containing several small bowel loops, and left inguinal hernia containing only fat. No evidence of bowel obstruction or strangulation. 3. Cholelithiasis. No radiographic evidence of cholecystitis. Electronically Signed   By: Marlaine Hind M.D.   On: 07/09/2018 21:13   Dg Chest Portable 1 View  Result Date: 07/09/2018 CLINICAL DATA:  Pleural effusions, history CHF, coronary artery disease post MI, diabetes mellitus, end-stage renal disease on dialysis, hypertension EXAM: PORTABLE CHEST 1 VIEW COMPARISON:  Portable exam 2047 hours compared to 02/15/2018 FINDINGS: Lordotic positioning. Enlargement of cardiac silhouette with pulmonary vascular congestion post median sternotomy. LEFT basilar atelectasis. Lungs otherwise clear. No definite infiltrate, pleural effusion or pneumothorax. Bones  demineralized. IMPRESSION: Enlargement of cardiac silhouette with pulmonary vascular congestion. LEFT basilar atelectasis. Electronically Signed   By: Lavonia Dana M.D.   On: 07/09/2018 23:01   Ct Angio Abd/pel W And/or Wo Contrast  Result Date: 07/09/2018 CLINICAL DATA:  Abdominal pain.  Assess for mesenteric ischemia EXAM: CTA ABDOMEN AND PELVIS wITHOUT AND WITH CONTRAST TECHNIQUE: Multidetector CT imaging of the abdomen and pelvis was performed using the standard protocol during bolus administration of intravenous contrast. Multiplanar reconstructed images and MIPs were obtained and reviewed to evaluate the vascular anatomy. CONTRAST:  179mL OMNIPAQUE IOHEXOL 350 MG/ML SOLN COMPARISON:  Noncontrast study performed earlier today. FINDINGS: VASCULAR Aorta: Heavily calcified.  No aneurysm or dissection. Celiac: Calcified, widely patent SMA: Calcified, widely patent Renals: Heavily calcified bilaterally. Probable hemodynamically significant stenosis within the proximal left renal artery, greater than 50%. IMA: Calcified with mild stenosis at the origin, likely approximately 50%. Inflow: Heavily calcified.  No aneurysm. Proximal Outflow: Heavily calcified. Vessels are poorly opacified which appears to be due to slow flow rather than focal stenosis. Veins: Grossly unremarkable. Review of the MIP images confirms the above findings. NON-VASCULAR Lower chest: Small to moderate bilateral pleural effusions. There is cardiomegaly. Bilateral lower lobe airspace opacities. Hepatobiliary: Small gallstones within the gallbladder. No focal hepatic abnormality. Pancreas: No focal abnormality or ductal dilatation. Spleen: No focal abnormality.  Normal size. Adrenals/Urinary Tract: Atrophic kidneys compatible with end-stage renal disease. No adrenal mass. Urinary bladder is decompressed, grossly unremarkable. Stomach/Bowel: Stomach, large and small bowel grossly unremarkable. Lymphatic: No adenopathy Reproductive: Moderate free  fluid in the cul-de-sac and adjacent  to the liver and spleen. Other: Bilateral inguinal hernias. The right inguinal hernia contains small bowel loops. No evidence of obstruction. Left inguinal hernia contains fat. Musculoskeletal: No acute bony abnormality. IMPRESSION: VASCULAR Heavily calcified aorta and branch vessels. Mild stenosis at the origin of the IMA. Celiac and SMA appear widely patent. Heavily calcified renal arteries bilaterally likely with hemodynamically significant stenosis within the proximal left renal artery. NON-VASCULAR Cholelithiasis. Moderate ascites. Bilateral inguinal hernias, containing small bowel on the right and fat on the left. No evidence of bowel obstruction. Electronically Signed   By: Rolm Baptise M.D.   On: 07/09/2018 23:42    Pending Labs Unresulted Labs (From admission, onward)    Start     Ordered   07/10/18 0102  SARS Coronavirus 2 (CEPHEID - Performed in [facility] lab), Hosp Order  (Asymptomatic Patients Labs)  Once,   STAT    Question:  Rule Out  Answer:  Yes   07/10/18 0101   07/09/18 1802  Urinalysis, Routine w reflex microscopic  Once,   STAT     07/09/18 1802   Signed and Held  HIV antibody (Routine Testing)  Tomorrow morning,   R     Signed and Held   Signed and Held  Basic metabolic panel  Tomorrow morning,   R     Signed and Held   Signed and Held  Hepatic function panel  Tomorrow morning,   R     Signed and Held   Signed and Held  CBC WITH DIFFERENTIAL  Tomorrow morning,   R     Signed and Held   Signed and Held  Hemoglobin A1c  Once,   R    Comments: To assess prior glycemic control    Signed and Held          Vitals/Pain Today's Vitals   07/09/18 2202 07/09/18 2230 07/09/18 2300 07/10/18 0130  BP:  (!) 151/100 138/80 (!) 150/92  Pulse:  74 74 81  Resp:   16 18  Temp:      TempSrc:      SpO2:  90% 98% 98%  PainSc: 5        Isolation Precautions No active isolations  Medications Medications  fentaNYL (SUBLIMAZE)  injection 100 mcg (100 mcg Intravenous Given 07/09/18 1819)  ondansetron (ZOFRAN) injection 4 mg (4 mg Intravenous Given 07/09/18 1819)  iohexol (OMNIPAQUE) 350 MG/ML injection 100 mL (100 mLs Intravenous Contrast Given 07/09/18 2320)  fentaNYL (SUBLIMAZE) injection 50 mcg (50 mcg Intravenous Given 07/10/18 0123)    Mobility non-ambulatory High fall risk   Focused Assessments Renal Assessment Handoff:  Hemodialysis Schedule: Hemodialysis Schedule: Tuesday/Thursday/Saturday Last Hemodialysis date and time: 07/07/18   Restricted appendage: left arm     R Recommendations: See Admitting Provider Note  Report given to:   Additional Notes: N/a

## 2018-07-10 NOTE — Progress Notes (Signed)
Patient stated that he takes sevelamer and carvedilol which are not listed on MAR. [facility] notified.

## 2018-07-10 NOTE — Progress Notes (Signed)
Patient admitted after midnight abdominal pain. Hx gallstones and inguinal hernia. CT without obstruction of hernia. HIDA scant with patent ducts. Has volume overload. Reports missing a dialysis session last week. Getting dialysis today. May need and extra session tomorrow as well. Abdominal pain improved continues with nausea.    PE Gen: drowsy, in no acute distress CV: rrr no mgr L AKA. Right leg without edema Resp: normal effort BS clear Abdomen: non distended mild pain epigastric area. No guarding or rebounding   A/P Hemodialysis BP control Stop antibiotics DM control    Dyanne Carrel, NP

## 2018-07-10 NOTE — Consult Note (Addendum)
[facility] Surgery Consult Note  Matthew Kline 1960-07-19  [id number].    Requesting MD: Domenic Polite Chief Complaint/Reason for Consult: right inguinal hernia  HPI:  Matthew Kline is a 58yo male PMH ESRD on HD M/W/F, CAD s/p CABG x3 05/18/16 on aspirin, HTN, DM-2, h/o lacunar infarct CVA 2007, chronic anemia, and h/o left AKA, who was admitted to [facility] earlier today complaining of 4-5 days of progressively worsening abdominal pain and nausea, no emesis. Pain is mostly in the epigastric region and radiates into his LUQ. Pain is constant but worse after eating. Associated nausea. Denies chest pain, shortness of breath, diarrhea, fever, chills, changes in bowel habits, no blood in his stools. Pt states he has had a R inguinal hernia for some time but it has gotten larger and more painful. He has seen a Psychologist, sport and exercise in the past to discuss repair. He states it is painful to use his L prosthetic and he believes this is due to his L inguinal hernia. He feels both of his hernias are affecting his ability to ambulate well.   Workup included CT angio which shows cholelithiasis, moderate ascites, bilateral inguinal hernias (containing small bowel on the right and fat on the left) with evidence of bowel obstruction. WBC 3.9, LFTs WNL. He also underwent HIDA scan which revealed patent cystic and common bile ducts, low gallbladder ejection fraction of 27%.   Of note patient was seen at [facility] in 02/2018 regarding his right inguinal hernia; he was set to obtain cardiac clearance and follow up with Dr. Raul Del to discuss laparoscopic right inguinal hernia repair.  Abdominal surgical history: none Anticoagulants: none Former smoker, quit in 2018 Denies alcohol use   ROS: Review of Systems  Constitutional: Negative for chills, diaphoresis and fever.  HENT: Negative for sore throat.   Respiratory: Negative for cough and shortness of breath.   Cardiovascular: Negative for chest pain.  Gastrointestinal:  Positive for abdominal pain and nausea. Negative for blood in stool, constipation, diarrhea and vomiting.       + inguinal hernia   Genitourinary: Negative for dysuria.  Skin: Negative for rash.  Neurological: Negative for dizziness and loss of consciousness.  All other systems reviewed and are negative.   Family History  Problem Relation Age of Onset  . Hypertension Mother   . Cancer Mother 23       Ovarian  . Heart disease Maternal Aunt   . Stroke Maternal Grandfather     Past Medical History:  Diagnosis Date  . Anemia, unspecified   . Anxiety   . Arthralgia 2010   polyarticular  . Arthritis    "back, knees" (01/10/2017)  . Cancer [facility])    "kidney area" (01/10/2017)  . CHF (congestive heart failure) ([place]) 07/25/2009   denies  . Chronic lower back pain   . Coronary artery disease   . Coughing    pt. reports that he has drainage from sinus infection  . Diabetic foot ulcer ([place])   . Diabetic neuropathy ([place])   . ESRD (end stage renal disease) on dialysis [facility])    started 12/2012; "MWF; [facility] [place] [facility] "  (01/10/2017)  . GERD (gastroesophageal reflux disease)    hx "before I lost weight", no problem 9 years  . Hemodialysis access site with mature fistula ([place])   . Hemorrhoids, internal 10/2011   small  . High cholesterol   . History of blood transfusion    "related to the anemia"  . Hypertension   . Insomnia, unspecified   .  Lacunar infarction ([place]) 2006   RUE/RLE, speech  . Long term (current) use of anticoagulants   . Myocardial infarction ([place]) 1995  . Orthostatic hypotension   . Osteomyelitis of foot, left, acute ([place])   . Other chronic postoperative pain   . Pneumonia    "probably twice" (01/10/2017)  . Polymyalgia rheumatica ([place])   . Renal insufficiency   . Sleep apnea    "lost weight; no more problem" (01/10/2017)  . Stroke ([employer]) 01/10/06   denies residual on 05/09/2014  . Type II diabetes mellitus ([place]) dx'd 1995  . Unspecified hereditary and  idiopathic peripheral neuropathy    feet  . Unspecified osteomyelitis, site unspecified   . Unspecified vitamin D deficiency     Past Surgical History:  Procedure Laterality Date  . ABDOMINAL AORTOGRAM N/A 08/25/2016   Procedure: ABDOMINAL AORTOGRAM;  Surgeon: Wellington Hampshire, MD;  Location: [place] [facility];  Service: Cardiovascular;  Laterality: N/A;  . AMPUTATION  01/21/2012   Procedure: AMPUTATION RAY;  Surgeon: [name], MD;  Location: [place];  Service: Orthopedics;  Laterality: Left;  Left Foot 4th Ray Amputation  . AMPUTATION Left 05/04/2013   Procedure: AMPUTATION DIGIT;  Surgeon: [name], MD;  Location: [place];  Service: Orthopedics;  Laterality: Left;  Left Great Toe Amputation at [facility]  . AMPUTATION Left 01/14/2017   Procedure: AMPUTATION ABOVE LEFT KNEE;  Surgeon: [name], MD;  Location: [place];  Service: Orthopedics;  Laterality: Left;  . ANTERIOR CERVICAL DECOMP/DISCECTOMY FUSION  02/2011  . BACK SURGERY    . BASCILIC VEIN TRANSPOSITION Left 10/19/2012   Procedure: BASCILIC VEIN TRANSPOSITION;  Surgeon: Serafina Mitchell, MD;  Location: [place];  Service: Vascular;  Laterality: Left;  . CARDIAC CATHETERIZATION     "before bypass"  . CORONARY ARTERY BYPASS GRAFT     x 5 with lima at [place] WITH ANTIBIOTIC SPACERS Left 08/07/2014   Procedure: Replace Left Total Knee Arthroplasty,  Place Antibiotic Spacer;  Surgeon: [name], MD;  Location: [place];  Service: Orthopedics;  Laterality: Left;  . I&D EXTREMITY Left 05/09/2014   Procedure: Irrigation and Debridement Left Knee and Closure of Total Knee Arthroplasty Incision;  Surgeon: [name], MD;  Location: [place];  Service: Orthopedics;  Laterality: Left;  . I&D KNEE WITH POLY EXCHANGE Left 05/31/2014   Procedure: IRRIGATION AND DEBRIDEMENT LEFT KNEE, PLACE ANTIBIOTIC BEADS,  POLY EXCHANGE;  Surgeon: [name], MD;  Location: [place];  Service: Orthopedics;  Laterality:  Left;  . IRRIGATION AND DEBRIDEMENT KNEE Left 01/12/2017   Procedure: IRRIGATION AND DEBRIDEMENT LEFT KNEE;  Surgeon: [name], MD;  Location: [place];  Service: Orthopedics;  Laterality: Left;  . JOINT REPLACEMENT    . KNEE ARTHROSCOPY Left 08-25-2012  . LOWER EXTREMITY ANGIOGRAPHY Left 08/25/2016   Procedure: Lower Extremity Angiography;  Surgeon: Wellington Hampshire, MD;  Location: [place] [facility];  Service: Cardiovascular;  Laterality: Left;  . PERIPHERAL VASCULAR BALLOON ANGIOPLASTY Left 08/25/2016   Procedure: PERIPHERAL VASCULAR BALLOON ANGIOPLASTY;  Surgeon: Wellington Hampshire, MD;  Location: [place] [facility];  Service: Cardiovascular;  Laterality: Left;  lt peroneal and ant tibial arteries cutting balloon  . REFRACTIVE SURGERY Bilateral   . TOE AMPUTATION Bilateral    "I've lost 7 toes over the last 7 years" (05/09/2014)  . TOE SURGERY Left April 2015   Big toe removed on left foot.  Marland Kitchen  TONSILLECTOMY    . TOTAL KNEE ARTHROPLASTY Left 04/10/2014   Procedure: TOTAL KNEE ARTHROPLASTY;  Surgeon: [name], MD;  Location: [place];  Service: Orthopedics;  Laterality: Left;  . TOTAL KNEE REVISION Left 10/25/2014   Procedure: LEFT TOTAL KNEE REVISION;  Surgeon: [name], MD;  Location: [place];  Service: Orthopedics;  Laterality: Left;  . TOTAL KNEE REVISION Left 11/26/2015   Procedure: Removal Left Total Knee Arthroplasty, Hinged Total Knee Arthroplasty;  Surgeon: [name], MD;  Location: [place];  Service: Orthopedics;  Laterality: Left;  . UVULOPALATOPHARYNGOPLASTY, TONSILLECTOMY AND SEPTOPLASTY  ~ 1989  . WOUND DEBRIDEMENT Left 05/09/2014   Dehiscence Left Total Knee Arthroplasty Incision    Social History:  reports that he quit smoking about 21 months ago. His smoking use included cigarettes. He has a 3.84 pack-year smoking history. He has never used smokeless tobacco. He reports that he does not drink alcohol or use drugs.  Allergies:  Allergies  Allergen Reactions  .  Morphine And Related Other (See Comments)    hallucinations  . Tygacil [Tigecycline] Nausea And Vomiting and Other (See Comments)    Makes him feel crazy    Medications Prior to Admission  Medication Sig Dispense Refill  . amLODipine (NORVASC) 5 MG tablet Take 5 mg by mouth daily.     Marland Kitchen doxycycline (VIBRA-TABS) 100 MG tablet Take 100 mg by mouth 2 (two) times daily.    . hydrOXYzine (ATARAX/VISTARIL) 25 MG tablet Take 25 mg by mouth 3 (three) times daily.    . sevelamer carbonate (RENVELA) 800 MG tablet Take 2,400 mg by mouth 3 (three) times daily with meals.     . temazepam (RESTORIL) 30 MG capsule Take 30 mg by mouth at bedtime as needed for sleep.    . Amino Acids-Protein Hydrolys (FEEDING SUPPLEMENT, PRO-STAT SUGAR FREE 64,) LIQD Take 30 mLs by mouth 2 (two) times daily. (Patient not taking: Reported on 11/30/2017) 900 mL 0  . aspirin EC 81 MG EC tablet Take 2 tablets (162 mg total) by mouth daily. (Patient not taking: Reported on 11/30/2017)    . carvedilol (COREG) 12.5 MG tablet Take 1 tablet (12.5 mg total) by mouth 2 (two) times daily with a meal. (Patient not taking: Reported on 07/10/2018) 60 tablet 0  . Darbepoetin Alfa (ARANESP) 150 MCG/0.3ML SOSY injection Inject 0.3 mLs (150 mcg total) into the vein every Friday with hemodialysis. (Patient not taking: Reported on 07/10/2018) 1.68 mL 0  . ferric citrate (AURYXIA) 1 GM 210 MG(Fe) tablet Take 2 tablets (420 mg total) by mouth 3 (three) times daily with meals. (Patient not taking: Reported on 11/30/2017) 180 tablet 0  . gabapentin (NEURONTIN) 300 MG capsule TAKE 1 CAPSULE BY MOUTH THREE TIMES A DAY (Patient not taking: No sig reported) 270 capsule 0  . montelukast (SINGULAIR) 10 MG tablet TAKE 1 TABLET (10 MG TOTAL) BY MOUTH AT BEDTIME. (Patient not taking: No sig reported) 90 tablet 1  . multivitamin (RENA-VIT) TABS tablet Take 1 tablet by mouth at bedtime. (Patient not taking: Reported on 07/10/2018) 30 tablet 0  . Nutritional  Supplements (FEEDING SUPPLEMENT, NEPRO CARB STEADY,) LIQD Take 237 mLs by mouth 2 (two) times daily between meals. (Patient not taking: Reported on 11/30/2017)  0  . ondansetron (ZOFRAN ODT) 4 MG disintegrating tablet Take 1 tablet (4 mg total) by mouth every 8 (eight) hours as needed for nausea or vomiting. (Patient not taking: Reported on 07/10/2018) 20 tablet 0  . pentoxifylline (  TRENTAL) 400 MG CR tablet TAKE 1 TABLET (400 MG TOTAL) BY MOUTH 3 (THREE) TIMES DAILY WITH MEALS. (Patient not taking: Reported on 07/10/2018) 90 tablet 1  . traMADol (ULTRAM) 50 MG tablet Take 1 tablet (50 mg total) by mouth every 6 (six) hours as needed. (Patient not taking: Reported on 07/10/2018) 15 tablet 0    Prior to Admission medications   Medication Sig Start Date End Date Taking? Authorizing Provider  amLODipine (NORVASC) 5 MG tablet Take 5 mg by mouth daily.    Yes [provider]  doxycycline (VIBRA-TABS) 100 MG tablet Take 100 mg by mouth 2 (two) times daily.   Yes [provider]  hydrOXYzine (ATARAX/VISTARIL) 25 MG tablet Take 25 mg by mouth 3 (three) times daily.   Yes [provider]  sevelamer carbonate (RENVELA) 800 MG tablet Take 2,400 mg by mouth 3 (three) times daily with meals.    Yes [provider]  temazepam (RESTORIL) 30 MG capsule Take 30 mg by mouth at bedtime as needed for sleep.   Yes [provider]  Amino Acids-Protein Hydrolys (FEEDING SUPPLEMENT, PRO-STAT SUGAR FREE 64,) LIQD Take 30 mLs by mouth 2 (two) times daily. Patient not taking: Reported on 11/30/2017 01/19/17   Patrecia Pour, Christean Grief, MD  aspirin EC 81 MG EC tablet Take 2 tablets (162 mg total) by mouth daily. Patient not taking: Reported on 11/30/2017 01/20/17   Patrecia Pour, Christean Grief, MD  carvedilol (COREG) 12.5 MG tablet Take 1 tablet (12.5 mg total) by mouth 2 (two) times daily with a meal. Patient not taking: Reported on 07/10/2018 02/08/17   Bonnell Public, MD  Darbepoetin Alfa  (ARANESP) 150 MCG/0.3ML SOSY injection Inject 0.3 mLs (150 mcg total) into the vein every Friday with hemodialysis. Patient not taking: Reported on 07/10/2018 02/11/17   Dana Allan I, MD  ferric citrate (AURYXIA) 1 GM 210 MG(Fe) tablet Take 2 tablets (420 mg total) by mouth 3 (three) times daily with meals. Patient not taking: Reported on 11/30/2017 02/08/17   Dana Allan I, MD  gabapentin (NEURONTIN) 300 MG capsule TAKE 1 CAPSULE BY MOUTH THREE TIMES A DAY Patient not taking: No sig reported 10/24/17   [name], MD  montelukast (SINGULAIR) 10 MG tablet TAKE 1 TABLET (10 MG TOTAL) BY MOUTH AT BEDTIME. Patient not taking: No sig reported 09/01/15   Gildardo Cranker, DO  multivitamin (RENA-VIT) TABS tablet Take 1 tablet by mouth at bedtime. Patient not taking: Reported on 07/10/2018 02/08/17   Bonnell Public, MD  Nutritional Supplements (FEEDING SUPPLEMENT, NEPRO CARB STEADY,) LIQD Take 237 mLs by mouth 2 (two) times daily between meals. Patient not taking: Reported on 11/30/2017 01/19/17   Patrecia Pour, Christean Grief, MD  ondansetron (ZOFRAN ODT) 4 MG disintegrating tablet Take 1 tablet (4 mg total) by mouth every 8 (eight) hours as needed for nausea or vomiting. Patient not taking: Reported on 07/10/2018 12/16/17   Kinnie Feil, PA-C  pentoxifylline (TRENTAL) 400 MG CR tablet TAKE 1 TABLET (400 MG TOTAL) BY MOUTH 3 (THREE) TIMES DAILY WITH MEALS. Patient not taking: Reported on 07/10/2018 02/22/18   [name], MD  traMADol (ULTRAM) 50 MG tablet Take 1 tablet (50 mg total) by mouth every 6 (six) hours as needed. Patient not taking: Reported on 07/10/2018 12/16/17   Kinnie Feil, PA-C    Blood pressure 140/82, pulse 76, temperature 98.2 F (36.8 C), temperature source Oral, resp. rate 15, height 5\' 11"  (1.803 m), weight 76.5 kg, SpO2  94 %. Physical Exam: Physical Exam  Constitutional: He is oriented to person, place, and time and well-developed, well-nourished, and in no  distress. No distress.  HENT:  Head: Normocephalic and atraumatic.  Nose: Nose normal.  Pt wearing a mask  Eyes: Pupils are equal, round, and reactive to light. Conjunctivae are normal. Right eye exhibits no discharge. Left eye exhibits no discharge. No scleral icterus.  Neck: Normal range of motion. Neck supple.  Cardiovascular: Normal rate, regular rhythm, normal heart sounds and intact distal pulses.  No murmur heard. Pulmonary/Chest: Effort normal and breath sounds normal. No respiratory distress. He has no wheezes. He has no rhonchi. He has no rales.  Abdominal: Soft. Normal appearance and bowel sounds are normal. He exhibits no distension. There is no hepatosplenomegaly. There is generalized abdominal tenderness. There is no rigidity and no guarding. A hernia is present. Hernia confirmed positive in the umbilical area (BL, large R hernia that is reducible with some TTP, small L hernia also reducible ).  Musculoskeletal: Normal range of motion.        General: Deformity (L AKA ) and edema (RLE 2+ pitting edema of lower leg and ankle with erythema ) present. No tenderness.  Lymphadenopathy:    He has no cervical adenopathy.  Neurological: He is alert and oriented to person, place, and time.  Skin: Skin is warm and dry. He is not diaphoretic.  Psychiatric: Mood and affect normal.  Nursing note and vitals reviewed.    Results for orders placed or performed during the hospital encounter of 07/09/18 (from the past 48 hour(s))  CBC with Differential     Status: Abnormal   Collection Time: 07/09/18  6:00 PM  Result Value Ref Range   WBC 3.9 (L) 4.0 - 10.5 K/uL   RBC 3.26 (L) 4.22 - 5.81 MIL/uL   Hemoglobin 10.2 (L) 13.0 - 17.0 g/dL   HCT 32.7 (L) 39.0 - 52.0 %   MCV 100.3 (H) 80.0 - 100.0 fL   MCH 31.3 26.0 - 34.0 pg   MCHC 31.2 30.0 - 36.0 g/dL   RDW 15.2 11.5 - 15.5 %   Platelets 139 (L) 150 - 400 K/uL    Comment: REPEATED TO VERIFY   nRBC 0.0 0.0 - 0.2 %   Neutrophils Relative %  56 %   Neutro Abs 2.2 1.7 - 7.7 K/uL   Lymphocytes Relative 28 %   Lymphs Abs 1.1 0.7 - 4.0 K/uL   Monocytes Relative 13 %   Monocytes Absolute 0.5 0.1 - 1.0 K/uL   Eosinophils Relative 2 %   Eosinophils Absolute 0.1 0.0 - 0.5 K/uL   Basophils Relative 1 %   Basophils Absolute 0.0 0.0 - 0.1 K/uL   Immature Granulocytes 0 %   Abs Immature Granulocytes 0.01 0.00 - 0.07 K/uL    Comment: Performed at [facility], [address] [place], [place] [address]  Comprehensive metabolic panel     Status: Abnormal   Collection Time: 07/09/18  6:00 PM  Result Value Ref Range   Sodium 139 135 - 145 mmol/L   Potassium 4.7 3.5 - 5.1 mmol/L   Chloride 97 (L) 98 - 111 mmol/L   CO2 29 22 - 32 mmol/L   Glucose, Bld 111 (H) 70 - 99 mg/dL   BUN 36 (H) 6 - 20 mg/dL   Creatinine, Ser 6.74 (H) 0.61 - 1.24 mg/dL   Calcium 9.9 8.9 - 10.3 mg/dL   Total Protein 6.7 6.5 - 8.1 g/dL  Albumin 3.3 (L) 3.5 - 5.0 g/dL   AST 25 15 - 41 U/L   ALT 20 0 - 44 U/L   Alkaline Phosphatase 84 38 - 126 U/L   Total Bilirubin 1.1 0.3 - 1.2 mg/dL   GFR calc non Af Amer 8 (L) >60 mL/min   GFR calc Af Amer 9 (L) >60 mL/min   Anion gap 13 5 - 15    Comment: Performed at [facility] [place] [address] [place], [place] [address]  Lipase, blood     Status: None   Collection Time: 07/09/18  6:00 PM  Result Value Ref Range   Lipase 24 11 - 51 U/L    Comment: Performed at [facility] [address] [place], [place] [address]  SARS Coronavirus 2 (CEPHEID - Performed in [place] hospital lab), Hosp Order     Status: None   Collection Time: 07/10/18  1:15 AM   Specimen: Nasopharyngeal Swab  Result Value Ref Range   SARS Coronavirus 2 NEGATIVE NEGATIVE    Comment: (NOTE) If result is NEGATIVE SARS-CoV-2 target nucleic acids are NOT DETECTED. The SARS-CoV-2 RNA is generally detectable in upper and lower  respiratory specimens during the acute phase of infection. The lowest  concentration of SARS-CoV-2  viral copies this assay can detect is 250  copies / mL. A negative result does not preclude SARS-CoV-2 infection  and should not be used as the sole basis for treatment or other  patient management decisions.  A negative result may occur with  improper specimen collection / handling, submission of specimen other  than nasopharyngeal swab, presence of viral mutation(s) within the  areas targeted by this assay, and inadequate number of viral copies  (<250 copies / mL). A negative result must be combined with clinical  observations, patient history, and epidemiological information. If result is POSITIVE SARS-CoV-2 target nucleic acids are DETECTED. The SARS-CoV-2 RNA is generally detectable in upper and lower  respiratory specimens dur ing the acute phase of infection.  Positive  results are indicative of active infection with SARS-CoV-2.  Clinical  correlation with patient history and other diagnostic information is  necessary to determine patient infection status.  Positive results do  not rule out bacterial infection or co-infection with other viruses. If result is PRESUMPTIVE POSTIVE SARS-CoV-2 nucleic acids MAY BE PRESENT.   A presumptive positive result was obtained on the submitted specimen  and confirmed on repeat testing.  While 2019 novel coronavirus  (SARS-CoV-2) nucleic acids may be present in the submitted sample  additional confirmatory testing may be necessary for epidemiological  and / or clinical management purposes  to differentiate between  SARS-CoV-2 and other Sarbecovirus currently known to infect humans.  If clinically indicated additional testing with an alternate test  methodology [phone]) is advised. The SARS-CoV-2 RNA is generally  detectable in upper and lower respiratory sp ecimens during the acute  phase of infection. The expected result is Negative. Fact Sheet for Patients:  StrictlyIdeas.no Fact Sheet for Healthcare  Providers: BankingDealers.co.za This test is not yet approved or cleared by the [place] FDA and has been authorized for detection and/or diagnosis of SARS-CoV-2 by FDA under an Emergency Use Authorization (EUA).  This EUA will remain in effect (meaning this test can be used) for the duration of the COVID-19 declaration under Section 564(b)(1) of the Act, 21 U.S.C. section 360bbb-3(b)(1), unless the authorization is terminated or revoked sooner. Performed at [facility], [place]  [address], [place], [place] [address]   HIV antibody (Routine Testing)     Status: None   Collection Time: 07/10/18  4:55 AM  Result Value Ref Range   HIV Screen 4th Generation wRfx Non Reactive Non Reactive    Comment: (NOTE) Performed At: [facility] [place], [place] [id number] Rush Farmer MD [place]:[id number]   Basic metabolic panel     Status: Abnormal   Collection Time: 07/10/18  4:55 AM  Result Value Ref Range   Sodium 135 135 - 145 mmol/L   Potassium 4.5 3.5 - 5.1 mmol/L   Chloride 93 (L) 98 - 111 mmol/L   CO2 28 22 - 32 mmol/L   Glucose, Bld 123 (H) 70 - 99 mg/dL   BUN 41 (H) 6 - 20 mg/dL   Creatinine, Ser 6.91 (H) 0.61 - 1.24 mg/dL   Calcium 9.2 8.9 - 10.3 mg/dL   GFR calc non Af Amer 8 (L) >60 mL/min   GFR calc Af Amer 9 (L) >60 mL/min   Anion gap 14 5 - 15    Comment: Performed at [facility], [place] [address], [place], [place] [address]  Hepatic function panel     Status: Abnormal   Collection Time: 07/10/18  4:55 AM  Result Value Ref Range   Total Protein 6.2 (L) 6.5 - 8.1 g/dL   Albumin 3.0 (L) 3.5 - 5.0 g/dL   AST 36 15 - 41 U/L   ALT 30 0 - 44 U/L   Alkaline Phosphatase 77 38 - 126 U/L   Total Bilirubin 1.0 0.3 - 1.2 mg/dL   Bilirubin, Direct 0.3 (H) 0.0 - 0.2 mg/dL   Indirect Bilirubin 0.7 0.3 - 0.9 mg/dL    Comment: Performed at [place] [address], [place], [place] [address]  CBC WITH DIFFERENTIAL      Status: Abnormal   Collection Time: 07/10/18  4:55 AM  Result Value Ref Range   WBC 3.4 (L) 4.0 - 10.5 K/uL   RBC 3.13 (L) 4.22 - 5.81 MIL/uL   Hemoglobin 9.7 (L) 13.0 - 17.0 g/dL   HCT 30.8 (L) 39.0 - 52.0 %   MCV 98.4 80.0 - 100.0 fL   MCH 31.0 26.0 - 34.0 pg   MCHC 31.5 30.0 - 36.0 g/dL   RDW 15.0 11.5 - 15.5 %   Platelets 132 (L) 150 - 400 K/uL   nRBC 0.0 0.0 - 0.2 %   Neutrophils Relative % 62 %   Neutro Abs 2.1 1.7 - 7.7 K/uL   Lymphocytes Relative 23 %   Lymphs Abs 0.8 0.7 - 4.0 K/uL   Monocytes Relative 12 %   Monocytes Absolute 0.4 0.1 - 1.0 K/uL   Eosinophils Relative 2 %   Eosinophils Absolute 0.1 0.0 - 0.5 K/uL   Basophils Relative 1 %   Basophils Absolute 0.0 0.0 - 0.1 K/uL   Immature Granulocytes 0 %   Abs Immature Granulocytes 0.00 0.00 - 0.07 K/uL    Comment: Performed at [facility], [address] [place], [place] [address]  Troponin I (High Sensitivity)     Status: None   Collection Time: 07/10/18  4:55 AM  Result Value Ref Range   Troponin I (High Sensitivity) 17 <18 ng/L    Comment: (NOTE) Elevated high sensitivity troponin I (hsTnI) values and significant  changes across serial measurements may suggest ACS but many other  chronic and acute conditions are known to elevate hsTnI results.  Refer to the "Links" section for chest pain algorithms and additional  guidance. Performed at [facility], [place] [address], [place], [place] [address]   Renal function panel     Status: Abnormal   Collection Time: 07/10/18  4:55 AM  Result Value Ref Range   Sodium 135 135 - 145 mmol/L   Potassium 4.6 3.5 - 5.1 mmol/L   Chloride 94 (L) 98 - 111 mmol/L   CO2 27 22 - 32 mmol/L   Glucose, Bld 119 (H) 70 - 99 mg/dL   BUN 41 (H) 6 - 20 mg/dL   Creatinine, Ser 7.00 (H) 0.61 - 1.24 mg/dL   Calcium 9.4 8.9 - 10.3 mg/dL   Phosphorus 5.8 (H) 2.5 - 4.6 mg/dL   Albumin 3.0 (L) 3.5 - 5.0 g/dL   GFR calc non Af Amer 8 (L) >60 mL/min   GFR calc Af Amer 9 (L) >60  mL/min   Anion gap 14 5 - 15    Comment: Performed at [facility], [address] [place], [place] [address]   Ct Abdomen Pelvis Wo Contrast  Result Date: 07/09/2018 CLINICAL DATA:  Abdominal pain and vomiting 2 days. Bowel obstruction. Inguinal hernia. EXAM: CT ABDOMEN AND PELVIS WITHOUT CONTRAST TECHNIQUE: Multidetector CT imaging of the abdomen and pelvis was performed following the standard protocol without IV contrast. COMPARISON:  01/02/2018 FINDINGS: Lower chest: New small bilateral pleural effusions and bibasilar atelectasis. Hepatobiliary: No mass visualized on this unenhanced exam. Tiny calcified gallstones are again seen, without definite evidence of cholecystitis. Pancreas: No mass or inflammatory process visualized on this unenhanced exam. Spleen:  Within normal limits in size. Adrenals/Urinary tract: Stable bilateral diffuse renal parenchymal atrophy. No evidence of renal masses or hydronephrosis. Diffuse renal vascular calcification noted. Stomach/Bowel: No evidence of obstruction or focal inflammatory process. A moderate size right inguinal hernia is seen containing several small bowel loops, however there is no evidence of obstruction or strangulation. A small left inguinal hernia is seen containing only fat. Vascular/Lymphatic: No pathologically enlarged lymph nodes identified. No evidence of abdominal aortic aneurysm. Aortic atherosclerosis. Reproductive:  No mass or other significant abnormality. Other: Mild ascites is new since previous study. Diffuse mesenteric and body wall edema is also new since prior exam. Musculoskeletal:  No suspicious bone lesions identified. IMPRESSION: 1. New mild ascites, diffuse mesenteric and body wall edema, and small bilateral pleural effusions, consistent with 3rd spacing. No focal inflammatory process or mass identified. 2. Increased sizes of right inguinal hernia containing several small bowel loops, and left inguinal hernia containing only fat.  No evidence of bowel obstruction or strangulation. 3. Cholelithiasis. No radiographic evidence of cholecystitis. Electronically Signed   By: Marlaine Hind M.D.   On: 07/09/2018 21:13   Nm Hepato W/eject Fract  Result Date: 07/10/2018 CLINICAL DATA:  Cholelithiasis EXAM: NUCLEAR MEDICINE HEPATOBILIARY IMAGING WITH GALLBLADDER EF TECHNIQUE: Sequential images of the abdomen were obtained out to 60 minutes following intravenous administration of radiopharmaceutical. After oral ingestion of Ensure, gallbladder ejection fraction was determined. At 60 min, normal ejection fraction is greater than 33%. RADIOPHARMACEUTICALS:  Five mCi Tc-56m  Choletec IV COMPARISON:  Abdominal CT from yesterday FINDINGS: Prompt uptake and biliary excretion of activity by the liver is seen. Gallbladder activity is visualized, consistent with patency of cystic duct. Biliary activity passes into small bowel, consistent with patent common bile duct. Calculated gallbladder ejection fraction is 27%. (Normal gallbladder ejection fraction with Ensure is greater than 33%.) IMPRESSION: Patent cystic and common  bile ducts. Low gallbladder ejection fraction of 27%. Electronically Signed   By: Monte Fantasia M.D.   On: 07/10/2018 10:36   Dg Chest Portable 1 View  Result Date: 07/09/2018 CLINICAL DATA:  Pleural effusions, history CHF, coronary artery disease post MI, diabetes mellitus, end-stage renal disease on dialysis, hypertension EXAM: PORTABLE CHEST 1 VIEW COMPARISON:  Portable exam 2047 hours compared to 02/15/2018 FINDINGS: Lordotic positioning. Enlargement of cardiac silhouette with pulmonary vascular congestion post median sternotomy. LEFT basilar atelectasis. Lungs otherwise clear. No definite infiltrate, pleural effusion or pneumothorax. Bones demineralized. IMPRESSION: Enlargement of cardiac silhouette with pulmonary vascular congestion. LEFT basilar atelectasis. Electronically Signed   By: Lavonia Dana M.D.   On: 07/09/2018 23:01    Ct Angio Abd/pel W And/or Wo Contrast  Result Date: 07/09/2018 CLINICAL DATA:  Abdominal pain.  Assess for mesenteric ischemia EXAM: CTA ABDOMEN AND PELVIS wITHOUT AND WITH CONTRAST TECHNIQUE: Multidetector CT imaging of the abdomen and pelvis was performed using the standard protocol during bolus administration of intravenous contrast. Multiplanar reconstructed images and MIPs were obtained and reviewed to evaluate the vascular anatomy. CONTRAST:  133mL OMNIPAQUE IOHEXOL 350 MG/ML SOLN COMPARISON:  Noncontrast study performed earlier today. FINDINGS: VASCULAR Aorta: Heavily calcified.  No aneurysm or dissection. Celiac: Calcified, widely patent SMA: Calcified, widely patent Renals: Heavily calcified bilaterally. Probable hemodynamically significant stenosis within the proximal left renal artery, greater than 50%. IMA: Calcified with mild stenosis at the origin, likely approximately 50%. Inflow: Heavily calcified.  No aneurysm. Proximal Outflow: Heavily calcified. Vessels are poorly opacified which appears to be due to slow flow rather than focal stenosis. Veins: Grossly unremarkable. Review of the MIP images confirms the above findings. NON-VASCULAR Lower chest: Small to moderate bilateral pleural effusions. There is cardiomegaly. Bilateral lower lobe airspace opacities. Hepatobiliary: Small gallstones within the gallbladder. No focal hepatic abnormality. Pancreas: No focal abnormality or ductal dilatation. Spleen: No focal abnormality.  Normal size. Adrenals/Urinary Tract: Atrophic kidneys compatible with end-stage renal disease. No adrenal mass. Urinary bladder is decompressed, grossly unremarkable. Stomach/Bowel: Stomach, large and small bowel grossly unremarkable. Lymphatic: No adenopathy Reproductive: Moderate free fluid in the cul-de-sac and adjacent to the liver and spleen. Other: Bilateral inguinal hernias. The right inguinal hernia contains small bowel loops. No evidence of obstruction. Left inguinal  hernia contains fat. Musculoskeletal: No acute bony abnormality. IMPRESSION: VASCULAR Heavily calcified aorta and branch vessels. Mild stenosis at the origin of the IMA. Celiac and SMA appear widely patent. Heavily calcified renal arteries bilaterally likely with hemodynamically significant stenosis within the proximal left renal artery. NON-VASCULAR Cholelithiasis. Moderate ascites. Bilateral inguinal hernias, containing small bowel on the right and fat on the left. No evidence of bowel obstruction. Electronically Signed   By: Rolm Baptise M.D.   On: 07/09/2018 23:42   Anti-infectives (From admission, onward)   Start     Dose/Rate Route Frequency Ordered Stop   07/10/18 0245  doxycycline (VIBRA-TABS) tablet 100 mg     100 mg Oral 2 times daily 07/10/18 0239          Assessment/Plan ESRD on HD M/W/F CAD s/p CABG x3 05/18/16 on aspirin HTN DM-2 H/o lacunar infarct CVA 2007 Chronic anemia H/o left AKA  Abdominal pain - HIDA neg, this is likely 2/2 his fluid overload and ascites - LFT's and Tbili WNL, WBC WNL  BL inguinal hernias - both are reducible and not incarcerated - the R does need to be repaired but this can be done in the outpt setting - pt  was seen in [facility] for this. He was to get cardiac clearance for repair and F/U with Dr. Raul Del.  - no emergent indication for hernia repair this admission - recommend follow up with Dr. Raul Del as pt was originally planning to do  ID - doxycycline 6/29>> VTE - SCDs FEN - renal/CM diet Foley - none Follow up - Dr. Raul Del  Thank you for the consult.   Jackson Latino, [facility] Pager [phone]

## 2018-07-10 NOTE — ED Provider Notes (Signed)
I assumed care of this patient from Dr. Billy Fischer at 2300.  Please see their note for further details of Hx, PE.  Briefly patient is a 58 y.o. male who presented with abdominal pain related to ascites.  Noted to be volume overloaded and hypoxic on room air.  Placed on 2 L nasal cannula.  Initial CT without contrast revealed mesenteric edema.  Chest x-ray with pulmonary vascular congestion.  Dr. Billy Fischer 1 to rule out mesenteric ischemia thus a CT angiogram is currently pending.  Current plan is to follow-up CT and treat accordingly.  Patient will still require admission for dialysis in the morning if CT negative.  CT without evidence of mesenteric ischemia.  Admitted to medicine for continued management     Takeyah Wieman, Grayce Sessions, MD 07/10/18 [phone]

## 2018-07-11 DIAGNOSIS — D631 Anemia in chronic kidney disease: Secondary | ICD-10-CM | POA: Diagnosis not present

## 2018-07-11 DIAGNOSIS — E1159 Type 2 diabetes mellitus with other circulatory complications: Secondary | ICD-10-CM | POA: Diagnosis not present

## 2018-07-11 DIAGNOSIS — N185 Chronic kidney disease, stage 5: Secondary | ICD-10-CM | POA: Diagnosis not present

## 2018-07-11 DIAGNOSIS — J9601 Acute respiratory failure with hypoxia: Secondary | ICD-10-CM | POA: Diagnosis not present

## 2018-07-11 DIAGNOSIS — E8770 Fluid overload, unspecified: Secondary | ICD-10-CM | POA: Diagnosis not present

## 2018-07-11 DIAGNOSIS — R1084 Generalized abdominal pain: Secondary | ICD-10-CM | POA: Diagnosis not present

## 2018-07-11 LAB — GLUCOSE, CAPILLARY
Glucose-Capillary: 105 mg/dL — ABNORMAL HIGH (ref 70–99)
Glucose-Capillary: 123 mg/dL — ABNORMAL HIGH (ref 70–99)
Glucose-Capillary: 170 mg/dL — ABNORMAL HIGH (ref 70–99)
Glucose-Capillary: 98 mg/dL (ref 70–99)

## 2018-07-11 LAB — BASIC METABOLIC PANEL
Anion gap: 12 (ref 5–15)
BUN: 26 mg/dL — ABNORMAL HIGH (ref 6–20)
CO2: 27 mmol/L (ref 22–32)
Calcium: 9.2 mg/dL (ref 8.9–10.3)
Chloride: 96 mmol/L — ABNORMAL LOW (ref 98–111)
Creatinine, Ser: 5.51 mg/dL — ABNORMAL HIGH (ref 0.61–1.24)
GFR calc Af Amer: 12 mL/min — ABNORMAL LOW (ref >60)
GFR calc non Af Amer: 10 mL/min — ABNORMAL LOW (ref >60)
Glucose, Bld: 100 mg/dL — ABNORMAL HIGH (ref 70–99)
Potassium: 4.2 mmol/L (ref 3.5–5.1)
Sodium: 135 mmol/L (ref 135–145)

## 2018-07-11 LAB — IRON AND TIBC
Iron: 87 ug/dL (ref 45–182)
Saturation Ratios: 46 % — ABNORMAL HIGH (ref 17.9–39.5)
TIBC: 189 ug/dL — ABNORMAL LOW (ref 250–450)
UIBC: 102 ug/dL

## 2018-07-11 MED ORDER — CHLORHEXIDINE GLUCONATE CLOTH 2 % EX PADS: 6.0000 | MEDICATED_PAD | Freq: Every day | CUTANEOUS | Status: DC

## 2018-07-11 MED ORDER — DOXERCALCIFEROL 4 MCG/2ML IV SOLN: 1.0000 ug | INTRAVENOUS | Status: DC

## 2018-07-11 NOTE — Progress Notes (Signed)
[facility] Progress Note     Subjective: CC-  No changes from yesterday. Both inguinal hernias still sore. Continues to have some upper abdominal pain after PO intake, but was able to eat well yesterday. Denies n/v today. BM yesterday.   States that he has obtained cardiac clearance but has not been back to see Dr. Raul Del yet. States that it has been difficult to get an appointment.  Objective: Vital signs in last 24 hours: Temp:  [97.5 F (36.4 C)-98.3 F (36.8 C)] 97.6 F (36.4 C) (06/30 0515) Pulse Rate:  [74-98] 74 (06/30 0515) Resp:  [10-19] 19 (06/30 0515) BP: (123-154)/(80-94) 140/87 (06/30 0515) SpO2:  [92 %-95 %] 93 % (06/30 0515) Weight:  [73.2 kg-76.5 kg] 73.4 kg (06/30 0518) Last BM Date: 07/10/18  Intake/Output from previous day: 06/29 0701 - 06/30 0700 In: 240 [P.O.:240] Out: 3000  Intake/Output this shift: No intake/output data recorded.  PE: Gen:  Alert, NAD HEENT: EOM's intact, pupils equal and round Card:  RRR, no M/G/R heard Pulm:  CTAB, no W/R/R, effort normal Abd: Soft, ND, mild generalized TTP without rebound or guarding, +BS, no HSM, B inguinal hernias R>L soft and reducible Ext:  S/p L AKA Psych: A&Ox3  Skin: no rashes noted, warm and dry  Lab Results:  Recent Labs    07/09/18 1800 07/10/18 0455  WBC 3.9* 3.4*  HGB 10.2* 9.7*  HCT 32.7* 30.8*  PLT 139* 132*   BMET Recent Labs    07/09/18 1800 07/10/18 0455  NA 139 135  135  K 4.7 4.6  4.5  CL 97* 94*  93*  CO2 29 27  28   GLUCOSE 111* 119*  123*  BUN 36* 41*  41*  CREATININE 6.74* 7.00*  6.91*  CALCIUM 9.9 9.4  9.2   PT/INR No results for input(s): LABPROT, INR in the last 72 hours. CMP     Component Value Date/Time   NA 135 07/10/2018 0455   NA 135 07/10/2018 0455   NA 141 08/17/2016 1203   K 4.5 07/10/2018 0455   K 4.6 07/10/2018 0455   CL 93 (L) 07/10/2018 0455   CL 94 (L) 07/10/2018 0455   CO2 28 07/10/2018 0455   CO2 27 07/10/2018 0455   GLUCOSE 123 (H) 07/10/2018 0455   GLUCOSE 119 (H) 07/10/2018 0455   BUN 41 (H) 07/10/2018 0455   BUN 41 (H) 07/10/2018 0455   BUN 33 (H) 08/17/2016 1203   CREATININE 6.91 (H) 07/10/2018 0455   CREATININE 7.00 (H) 07/10/2018 0455   CALCIUM 9.2 07/10/2018 0455   CALCIUM 9.4 07/10/2018 0455   PROT 6.2 (L) 07/10/2018 0455   PROT 7.1 06/25/2013 1007   ALBUMIN 3.0 (L) 07/10/2018 0455   ALBUMIN 3.0 (L) 07/10/2018 0455   ALBUMIN 4.0 06/25/2013 1007   AST 36 07/10/2018 0455   ALT 30 07/10/2018 0455   ALKPHOS 77 07/10/2018 0455   BILITOT 1.0 07/10/2018 0455   GFRNONAA 8 (L) 07/10/2018 0455   GFRNONAA 8 (L) 07/10/2018 0455   GFRAA 9 (L) 07/10/2018 0455   GFRAA 9 (L) 07/10/2018 0455   Lipase     Component Value Date/Time   LIPASE 24 07/09/2018 1800       Studies/Results: Ct Abdomen Pelvis Wo Contrast  Result Date: 07/09/2018 CLINICAL DATA:  Abdominal pain and vomiting 2 days. Bowel obstruction. Inguinal hernia. EXAM: CT ABDOMEN AND PELVIS WITHOUT CONTRAST TECHNIQUE: Multidetector CT imaging of the abdomen and pelvis was performed following the standard protocol without  IV contrast. COMPARISON:  01/02/2018 FINDINGS: Lower chest: New small bilateral pleural effusions and bibasilar atelectasis. Hepatobiliary: No mass visualized on this unenhanced exam. Tiny calcified gallstones are again seen, without definite evidence of cholecystitis. Pancreas: No mass or inflammatory process visualized on this unenhanced exam. Spleen:  Within normal limits in size. Adrenals/Urinary tract: Stable bilateral diffuse renal parenchymal atrophy. No evidence of renal masses or hydronephrosis. Diffuse renal vascular calcification noted. Stomach/Bowel: No evidence of obstruction or focal inflammatory process. A moderate size right inguinal hernia is seen containing several small bowel loops, however there is no evidence of obstruction or strangulation. A small left inguinal hernia is seen containing only fat.  Vascular/Lymphatic: No pathologically enlarged lymph nodes identified. No evidence of abdominal aortic aneurysm. Aortic atherosclerosis. Reproductive:  No mass or other significant abnormality. Other: Mild ascites is new since previous study. Diffuse mesenteric and body wall edema is also new since prior exam. Musculoskeletal:  No suspicious bone lesions identified. IMPRESSION: 1. New mild ascites, diffuse mesenteric and body wall edema, and small bilateral pleural effusions, consistent with 3rd spacing. No focal inflammatory process or mass identified. 2. Increased sizes of right inguinal hernia containing several small bowel loops, and left inguinal hernia containing only fat. No evidence of bowel obstruction or strangulation. 3. Cholelithiasis. No radiographic evidence of cholecystitis. Electronically Signed   By: Marlaine Hind M.D.   On: 07/09/2018 21:13   Nm Hepato W/eject Fract  Result Date: 07/10/2018 CLINICAL DATA:  Cholelithiasis EXAM: NUCLEAR MEDICINE HEPATOBILIARY IMAGING WITH GALLBLADDER EF TECHNIQUE: Sequential images of the abdomen were obtained out to 60 minutes following intravenous administration of radiopharmaceutical. After oral ingestion of Ensure, gallbladder ejection fraction was determined. At 60 min, normal ejection fraction is greater than 33%. RADIOPHARMACEUTICALS:  Five mCi Tc-56m  Choletec IV COMPARISON:  Abdominal CT from yesterday FINDINGS: Prompt uptake and biliary excretion of activity by the liver is seen. Gallbladder activity is visualized, consistent with patency of cystic duct. Biliary activity passes into small bowel, consistent with patent common bile duct. Calculated gallbladder ejection fraction is 27%. (Normal gallbladder ejection fraction with Ensure is greater than 33%.) IMPRESSION: Patent cystic and common bile ducts. Low gallbladder ejection fraction of 27%. Electronically Signed   By: Monte Fantasia M.D.   On: 07/10/2018 10:36   Dg Chest Portable 1 View  Result  Date: 07/09/2018 CLINICAL DATA:  Pleural effusions, history CHF, coronary artery disease post MI, diabetes mellitus, end-stage renal disease on dialysis, hypertension EXAM: PORTABLE CHEST 1 VIEW COMPARISON:  Portable exam 2047 hours compared to 02/15/2018 FINDINGS: Lordotic positioning. Enlargement of cardiac silhouette with pulmonary vascular congestion post median sternotomy. LEFT basilar atelectasis. Lungs otherwise clear. No definite infiltrate, pleural effusion or pneumothorax. Bones demineralized. IMPRESSION: Enlargement of cardiac silhouette with pulmonary vascular congestion. LEFT basilar atelectasis. Electronically Signed   By: Lavonia Dana M.D.   On: 07/09/2018 23:01   Ct Angio Abd/pel W And/or Wo Contrast  Result Date: 07/09/2018 CLINICAL DATA:  Abdominal pain.  Assess for mesenteric ischemia EXAM: CTA ABDOMEN AND PELVIS wITHOUT AND WITH CONTRAST TECHNIQUE: Multidetector CT imaging of the abdomen and pelvis was performed using the standard protocol during bolus administration of intravenous contrast. Multiplanar reconstructed images and MIPs were obtained and reviewed to evaluate the vascular anatomy. CONTRAST:  166mL OMNIPAQUE IOHEXOL 350 MG/ML SOLN COMPARISON:  Noncontrast study performed earlier today. FINDINGS: VASCULAR Aorta: Heavily calcified.  No aneurysm or dissection. Celiac: Calcified, widely patent SMA: Calcified, widely patent Renals: Heavily calcified bilaterally. Probable hemodynamically significant stenosis within  the proximal left renal artery, greater than 50%. IMA: Calcified with mild stenosis at the origin, likely approximately 50%. Inflow: Heavily calcified.  No aneurysm. Proximal Outflow: Heavily calcified. Vessels are poorly opacified which appears to be due to slow flow rather than focal stenosis. Veins: Grossly unremarkable. Review of the MIP images confirms the above findings. NON-VASCULAR Lower chest: Small to moderate bilateral pleural effusions. There is cardiomegaly.  Bilateral lower lobe airspace opacities. Hepatobiliary: Small gallstones within the gallbladder. No focal hepatic abnormality. Pancreas: No focal abnormality or ductal dilatation. Spleen: No focal abnormality.  Normal size. Adrenals/Urinary Tract: Atrophic kidneys compatible with end-stage renal disease. No adrenal mass. Urinary bladder is decompressed, grossly unremarkable. Stomach/Bowel: Stomach, large and small bowel grossly unremarkable. Lymphatic: No adenopathy Reproductive: Moderate free fluid in the cul-de-sac and adjacent to the liver and spleen. Other: Bilateral inguinal hernias. The right inguinal hernia contains small bowel loops. No evidence of obstruction. Left inguinal hernia contains fat. Musculoskeletal: No acute bony abnormality. IMPRESSION: VASCULAR Heavily calcified aorta and branch vessels. Mild stenosis at the origin of the IMA. Celiac and SMA appear widely patent. Heavily calcified renal arteries bilaterally likely with hemodynamically significant stenosis within the proximal left renal artery. NON-VASCULAR Cholelithiasis. Moderate ascites. Bilateral inguinal hernias, containing small bowel on the right and fat on the left. No evidence of bowel obstruction. Electronically Signed   By: Rolm Baptise M.D.   On: 07/09/2018 23:42    Anti-infectives: Anti-infectives (From admission, onward)   Start     Dose/Rate Route Frequency Ordered Stop   07/10/18 0245  doxycycline (VIBRA-TABS) tablet 100 mg     100 mg Oral 2 times daily 07/10/18 0239         Assessment/Plan ESRD on HD M/W/F CAD s/p CABG x3 05/18/16 on aspirin HTN DM-2 H/o lacunar infarct CVA 2007 Chronic anemia H/o left AKA  Abdominal pain - HIDA neg, this is likely 2/2 his fluid overload and ascites - LFT's and Tbili WNL, WBC WNL  BL inguinal hernias - both are reducible and not incarcerated, he is tolerating a diet and having bowel function - the Right does need to be repaired but this is not emergent and can be done  in the outpt setting - He has already obtained cardiac clearance. Recommend f/u with Dr. Raul Del as previously planned General surgery will sign off, please call us back with any concerns.  ID - doxycycline 6/29>> VTE - SCDs FEN - renal/CM diet Foley - none Follow up - Dr. Raul Del   LOS: 1 day    Matthew Kline , [facility] 07/11/2018, 7:40 AM Pager: [phone] Mon-Thurs 7:00 am-4:30 pm Fri 7:00 am -11:30 AM Sat-Sun 7:00 am-11:30 am

## 2018-07-11 NOTE — Progress Notes (Signed)
[facility] PROGRESS NOTE  Matthew Kline IHK:[id number] DOB: 03-08-1960 DOA: 07/09/2018 PCP: Patient, No Pcp Per  Assessment/Plan:  #1. Abdominal pain likely related to volume overload as result of non-compliance with HD. CT abdomen reveals bilateral inguinal hernia, non-obstructed with small bowel in the right, moderate ascites and gallstones. HIDA scan 6/29 revealed patent cystic duct, mildly low gallbladder EF. He was dialyzed yesterday and pain improved. Only had pain after eating. Continues with 1-2+ LE edema (right) ascites and plerual effusions. Evaluated by nephrology who plan dialysis for tomorrow.  -supportive therapy -monitor  -dialysis tomorrow  #2. Bilateral inguinal hernia. Evaluated by general surgery who opine no surgical intervention required now and recommend patient f/u with primary surgeon in [facility].   #3. ESRD a MWF dialysis patient. Hx of intermittent non-compliance with dialysis ie. Will miss sessions or cut sessions short. Of note he stats today that he does no believe "they took enough off yesterday" -dialysis as noted above -appreciate nephrology assistance  #4. Acute respiratory failure with hypoxia. Resolved this am. Oxygen saturation level 89% on room air on admission. Likely related to volume overload -monitor -dialysis as noted above  #5. Hypertension fair control. Continue home regimen  #6. CAD. S/p cabg. No chest pain.  -continue home meds  #7. Diabetes. Diet controlled. Serum glucose 100 this am -monitor  #8 folliculitis. OP diagnosis per dermatology.  -continue doxy    Code Status: full Family Communication: patient Disposition Plan: home hopefully tomorrow after dialysis   Consultants:  Justin Mend nephrology  [place]  General surgery  Procedures:  Dialysis 07/10/18  Antibiotics:  doxy  HPI/Subjective: 58 yo hx esrd , cad s/p cabg, dm, PVD, left BKA, bilateral inguinal hernias admitted with abdominal pain likely  related to volume overload. Some improvement with dialysis yesterday. Dialysis tomorrow. Adv diet  Objective: Vitals:   07/11/18 0515 07/11/18 0826  BP: 140/87 (!) 142/91  Pulse: 74 70  Resp: 19   Temp: 97.6 F (36.4 C)   SpO2: 93%     Intake/Output Summary (Last 24 hours) at 07/11/2018 1135 Last data filed at 07/11/2018 0946 Gross per 24 hour  Intake 360 ml  Output 3000 ml  Net -2640 ml   Filed Weights   07/10/18 1115 07/10/18 1431 07/11/18 0518  Weight: 76.5 kg 73.2 kg 73.4 kg    Exam:   General:  Awake alert watching TV no acute distress  Cardiovascular: rrr no mgr no LE edema (left BKA)  Respiratory: normal effort BS distant but clear no wheeze  Abdomen: non-distended soft +BS no guarding or rebounding  Musculoskeletal:  Joints without swelling/erythema  Data Reviewed: Basic Metabolic Panel: Recent Labs  Lab 07/09/18 1800 07/10/18 0455 07/11/18 0812  NA 139 135  135 135  K 4.7 4.6  4.5 4.2  CL 97* 94*  93* 96*  CO2 29 27  28 27   GLUCOSE 111* 119*  123* 100*  BUN 36* 41*  41* 26*  CREATININE 6.74* 7.00*  6.91* 5.51*  CALCIUM 9.9 9.4  9.2 9.2  PHOS  --  5.8*  --    Liver Function Tests: Recent Labs  Lab 07/09/18 1800 07/10/18 0455  AST 25 36  ALT 20 30  ALKPHOS 84 77  BILITOT 1.1 1.0  PROT 6.7 6.2*  ALBUMIN 3.3* 3.0*  3.0*   Recent Labs  Lab 07/09/18 1800  LIPASE 24   No results for input(s): AMMONIA in the last 168 hours. CBC: Recent Labs  Lab 07/09/18 1800 07/10/18 0455  WBC 3.9* 3.4*  NEUTROABS 2.2 2.1  HGB 10.2* 9.7*  HCT 32.7* 30.8*  MCV 100.3* 98.4  PLT 139* 132*   Cardiac Enzymes: No results for input(s): CKTOTAL, CKMB, CKMBINDEX, TROPONINI in the last 168 hours. BNP (last 3 results) No results for input(s): BNP in the last 8760 hours.  ProBNP (last 3 results) No results for input(s): PROBNP in the last 8760 hours.  CBG: Recent Labs  Lab 07/10/18 2121 07/11/18 0608  GLUCAP 122* 105*    Recent Results  (from the past 240 hour(s))  SARS Coronavirus 2 (CEPHEID - Performed in [place] hospital lab), Hosp Order     Status: None   Collection Time: 07/10/18  1:15 AM   Specimen: Nasopharyngeal Swab  Result Value Ref Range Status   SARS Coronavirus 2 NEGATIVE NEGATIVE Final    Comment: (NOTE) If result is NEGATIVE SARS-CoV-2 target nucleic acids are NOT DETECTED. The SARS-CoV-2 RNA is generally detectable in upper and lower  respiratory specimens during the acute phase of infection. The lowest  concentration of SARS-CoV-2 viral copies this assay can detect is 250  copies / mL. A negative result does not preclude SARS-CoV-2 infection  and should not be used as the sole basis for treatment or other  patient management decisions.  A negative result may occur with  improper specimen collection / handling, submission of specimen other  than nasopharyngeal swab, presence of viral mutation(s) within the  areas targeted by this assay, and inadequate number of viral copies  (<250 copies / mL). A negative result must be combined with clinical  observations, patient history, and epidemiological information. If result is POSITIVE SARS-CoV-2 target nucleic acids are DETECTED. The SARS-CoV-2 RNA is generally detectable in upper and lower  respiratory specimens dur ing the acute phase of infection.  Positive  results are indicative of active infection with SARS-CoV-2.  Clinical  correlation with patient history and other diagnostic information is  necessary to determine patient infection status.  Positive results do  not rule out bacterial infection or co-infection with other viruses. If result is PRESUMPTIVE POSTIVE SARS-CoV-2 nucleic acids MAY BE PRESENT.   A presumptive positive result was obtained on the submitted specimen  and confirmed on repeat testing.  While 2019 novel coronavirus  (SARS-CoV-2) nucleic acids may be present in the submitted sample  additional confirmatory testing may be  necessary for epidemiological  and / or clinical management purposes  to differentiate between  SARS-CoV-2 and other Sarbecovirus currently known to infect humans.  If clinically indicated additional testing with an alternate test  methodology [phone]) is advised. The SARS-CoV-2 RNA is generally  detectable in upper and lower respiratory sp ecimens during the acute  phase of infection. The expected result is Negative. Fact Sheet for Patients:  StrictlyIdeas.no Fact Sheet for Healthcare Providers: BankingDealers.co.za This test is not yet approved or cleared by the [place] FDA and has been authorized for detection and/or diagnosis of SARS-CoV-2 by FDA under an Emergency Use Authorization (EUA).  This EUA will remain in effect (meaning this test can be used) for the duration of the COVID-19 declaration under Section 564(b)(1) of the Act, 21 U.S.C. section 360bbb-3(b)(1), unless the authorization is terminated or revoked sooner. Performed at [facility], [place] [address], [place], [place] [address]   MRSA PCR Screening     Status: None   Collection Time: 07/10/18  5:59 PM   Specimen: Nasal Mucosa; Nasopharyngeal  Result Value Ref Range Status   MRSA by  PCR NEGATIVE NEGATIVE Final    Comment:        The GeneXpert MRSA Assay (FDA approved for NASAL specimens only), is one component of a comprehensive MRSA colonization surveillance program. It is not intended to diagnose MRSA infection nor to guide or monitor treatment for MRSA infections. Performed at [facility], [place] [address] [place], [place] [address]      Studies: Ct Abdomen Pelvis Wo Contrast  Result Date: 07/09/2018 CLINICAL DATA:  Abdominal pain and vomiting 2 days. Bowel obstruction. Inguinal hernia. EXAM: CT ABDOMEN AND PELVIS WITHOUT CONTRAST TECHNIQUE: Multidetector CT imaging of the abdomen and pelvis was performed following the standard protocol  without IV contrast. COMPARISON:  01/02/2018 FINDINGS: Lower chest: New small bilateral pleural effusions and bibasilar atelectasis. Hepatobiliary: No mass visualized on this unenhanced exam. Tiny calcified gallstones are again seen, without definite evidence of cholecystitis. Pancreas: No mass or inflammatory process visualized on this unenhanced exam. Spleen:  Within normal limits in size. Adrenals/Urinary tract: Stable bilateral diffuse renal parenchymal atrophy. No evidence of renal masses or hydronephrosis. Diffuse renal vascular calcification noted. Stomach/Bowel: No evidence of obstruction or focal inflammatory process. A moderate size right inguinal hernia is seen containing several small bowel loops, however there is no evidence of obstruction or strangulation. A small left inguinal hernia is seen containing only fat. Vascular/Lymphatic: No pathologically enlarged lymph nodes identified. No evidence of abdominal aortic aneurysm. Aortic atherosclerosis. Reproductive:  No mass or other significant abnormality. Other: Mild ascites is new since previous study. Diffuse mesenteric and body wall edema is also new since prior exam. Musculoskeletal:  No suspicious bone lesions identified. IMPRESSION: 1. New mild ascites, diffuse mesenteric and body wall edema, and small bilateral pleural effusions, consistent with 3rd spacing. No focal inflammatory process or mass identified. 2. Increased sizes of right inguinal hernia containing several small bowel loops, and left inguinal hernia containing only fat. No evidence of bowel obstruction or strangulation. 3. Cholelithiasis. No radiographic evidence of cholecystitis. Electronically Signed   By: Marlaine Hind M.D.   On: 07/09/2018 21:13   Nm Hepato W/eject Fract  Result Date: 07/10/2018 CLINICAL DATA:  Cholelithiasis EXAM: NUCLEAR MEDICINE HEPATOBILIARY IMAGING WITH GALLBLADDER EF TECHNIQUE: Sequential images of the abdomen were obtained out to 60 minutes following  intravenous administration of radiopharmaceutical. After oral ingestion of Ensure, gallbladder ejection fraction was determined. At 60 min, normal ejection fraction is greater than 33%. RADIOPHARMACEUTICALS:  Five mCi Tc-26m  Choletec IV COMPARISON:  Abdominal CT from yesterday FINDINGS: Prompt uptake and biliary excretion of activity by the liver is seen. Gallbladder activity is visualized, consistent with patency of cystic duct. Biliary activity passes into small bowel, consistent with patent common bile duct. Calculated gallbladder ejection fraction is 27%. (Normal gallbladder ejection fraction with Ensure is greater than 33%.) IMPRESSION: Patent cystic and common bile ducts. Low gallbladder ejection fraction of 27%. Electronically Signed   By: Monte Fantasia M.D.   On: 07/10/2018 10:36   Dg Chest Portable 1 View  Result Date: 07/09/2018 CLINICAL DATA:  Pleural effusions, history CHF, coronary artery disease post MI, diabetes mellitus, end-stage renal disease on dialysis, hypertension EXAM: PORTABLE CHEST 1 VIEW COMPARISON:  Portable exam 2047 hours compared to 02/15/2018 FINDINGS: Lordotic positioning. Enlargement of cardiac silhouette with pulmonary vascular congestion post median sternotomy. LEFT basilar atelectasis. Lungs otherwise clear. No definite infiltrate, pleural effusion or pneumothorax. Bones demineralized. IMPRESSION: Enlargement of cardiac silhouette with pulmonary vascular congestion. LEFT basilar atelectasis. Electronically Signed   By: Elta Guadeloupe  Thornton Papas M.D.   On: 07/09/2018 23:01   Ct Angio Abd/pel W And/or Wo Contrast  Result Date: 07/09/2018 CLINICAL DATA:  Abdominal pain.  Assess for mesenteric ischemia EXAM: CTA ABDOMEN AND PELVIS wITHOUT AND WITH CONTRAST TECHNIQUE: Multidetector CT imaging of the abdomen and pelvis was performed using the standard protocol during bolus administration of intravenous contrast. Multiplanar reconstructed images and MIPs were obtained and reviewed to  evaluate the vascular anatomy. CONTRAST:  139mL OMNIPAQUE IOHEXOL 350 MG/ML SOLN COMPARISON:  Noncontrast study performed earlier today. FINDINGS: VASCULAR Aorta: Heavily calcified.  No aneurysm or dissection. Celiac: Calcified, widely patent SMA: Calcified, widely patent Renals: Heavily calcified bilaterally. Probable hemodynamically significant stenosis within the proximal left renal artery, greater than 50%. IMA: Calcified with mild stenosis at the origin, likely approximately 50%. Inflow: Heavily calcified.  No aneurysm. Proximal Outflow: Heavily calcified. Vessels are poorly opacified which appears to be due to slow flow rather than focal stenosis. Veins: Grossly unremarkable. Review of the MIP images confirms the above findings. NON-VASCULAR Lower chest: Small to moderate bilateral pleural effusions. There is cardiomegaly. Bilateral lower lobe airspace opacities. Hepatobiliary: Small gallstones within the gallbladder. No focal hepatic abnormality. Pancreas: No focal abnormality or ductal dilatation. Spleen: No focal abnormality.  Normal size. Adrenals/Urinary Tract: Atrophic kidneys compatible with end-stage renal disease. No adrenal mass. Urinary bladder is decompressed, grossly unremarkable. Stomach/Bowel: Stomach, large and small bowel grossly unremarkable. Lymphatic: No adenopathy Reproductive: Moderate free fluid in the cul-de-sac and adjacent to the liver and spleen. Other: Bilateral inguinal hernias. The right inguinal hernia contains small bowel loops. No evidence of obstruction. Left inguinal hernia contains fat. Musculoskeletal: No acute bony abnormality. IMPRESSION: VASCULAR Heavily calcified aorta and branch vessels. Mild stenosis at the origin of the IMA. Celiac and SMA appear widely patent. Heavily calcified renal arteries bilaterally likely with hemodynamically significant stenosis within the proximal left renal artery. NON-VASCULAR Cholelithiasis. Moderate ascites. Bilateral inguinal hernias,  containing small bowel on the right and fat on the left. No evidence of bowel obstruction. Electronically Signed   By: Rolm Baptise M.D.   On: 07/09/2018 23:42    Scheduled Meds: . amLODipine  5 mg Oral Daily  . aspirin EC  81 mg Oral Daily  . carvedilol  12.5 mg Oral BID WC  . Chlorhexidine Gluconate Cloth  6 each Topical Q0600  . doxercalciferol  1 mcg Intravenous Q M,W,F-HD  . doxycycline  100 mg Oral BID  . hydrOXYzine  25 mg Oral TID  . insulin aspart  0-9 Units Subcutaneous TID WC  . mouth rinse  15 mL Mouth Rinse BID  . sevelamer carbonate  2,400 mg Oral TID WC   Continuous Infusions:  Principal Problem:   Abdominal pain Active Problems:   ESRD on dialysis ([place])   Acute respiratory failure with hypoxia (HCC)   DM type 2 causing vascular disease (HCC)   Type II diabetes mellitus (HCC)   CAD, multiple vessel   Polymyalgia rheumatica (HCC)   Anemia in chronic kidney disease   Bilateral recurrent inguinal hernia without obstruction or gangrene    Time spent: 49 minutes    [facility] M NP  [facility]  If 7PM-7AM, please contact night-coverage at [email or web address], password [facility] 07/11/2018, 11:35 AM  LOS: 1 day

## 2018-07-11 NOTE — Plan of Care (Signed)
  Problem: Education: Goal: Knowledge of General Education information will improve Description: Including pain rating scale, medication(s)/side effects and non-pharmacologic comfort measures Outcome: Progressing   Problem: Clinical Measurements: Goal: Diagnostic test results will improve Outcome: Progressing Goal: Respiratory complications will improve Outcome: Progressing Goal: Cardiovascular complication will be avoided Outcome: Progressing   Problem: Activity: Goal: Risk for activity intolerance will decrease Outcome: Progressing   Problem: Pain Managment: Goal: General experience of comfort will improve Outcome: Progressing   Problem: Safety: Goal: Ability to remain free from injury will improve Outcome: Progressing

## 2018-07-11 NOTE — Consult Note (Signed)
Referring Provider: No ref. provider found Primary Care Physician:  Patient, No Pcp Per Primary Nephrologist:  Dr. Joelyn Oms  Reason for Consultation: Medical management end-stage renal disease, assessment of anemia, treatment secondary hyperparathyroidism, maintenance of euvolemia  HPI: Is a 58 year old gentleman with a history of congestive heart failure and coronary artery disease status post CABG x3 05/18/2016, history of lacunar infarct CVA 2007 history of left AKA, diabetes mellitus and diabetic neuropathy with diabetic foot ulcer, he has a history of end-stage renal disease Monday Wednesday Friday dialysis [facility].  He was admitted 07/10/2018 with bilateral inguinal hernias containing small bowel on the right and fat on the left with evidence of small bowel obstruction.  He also had CT evidence of cholelithiasis HIDA scan showed marked patient cystic and common bile ducts low gallbladder ejection fraction.  Of note he was seen at [facility] 02/2018 regarding his right inguinal hernias and he was given cardiac clearance and follow-up with Dr. Barbie Haggis for laparoscopic right inguinal hernia repair.  He has been seen by surgery and there is no urgent indications for repair.  His dialysis today is Wednesday he is a Monday Wednesday Friday dialysis patient.  We discussed his dialysis treatment.  HD orders: MWF [employer] 4 hrs 180NRe 400/Autoflow 2.0 72 kg 2.0 K/2.0 Ca UFP 4 -Heparin 4000 units IV TIW -Mircera 100 mcg IV q 2 week (last dose 07/04/18) -Venofer 50 mg IV weekly   Blood pressure 127/80 pulse 66 temperature 97.8 O2 sats 93% on room air  Sodium 135 potassium 4.2 chloride 96 CO2 27 BUN 26 creatinine 5.51 glucose 106 calcium 9.2 phosphorus 5.8 albumin 3.0 AST 36 ALT 30.  WBC 3.4 hemoglobin 9.7 platelets 132  CT angiogram showed cholelithiasis moderate ascites and bilateral inguinal hernias  Chest x-ray showed bilateral atelectasis mild cardiac  enlargement cardiac silhouette pulmonary vascular congestion  Medications amlodipine 5 mg daily aspirin 81 mg Coreg 12.5 mg twice daily Hectorol 1 mcg Monday Wednesday Friday, doxycycline 100 mg twice daily     Past Medical History:  Diagnosis Date  . Anemia, unspecified   . Anxiety   . Arthralgia 2010   polyarticular  . Arthritis    "back, knees" (01/10/2017)  . Cancer [facility])    "kidney area" (01/10/2017)  . CHF (congestive heart failure) ([place]) 07/25/2009   denies  . Chronic lower back pain   . Coronary artery disease   . Coughing    pt. reports that he has drainage from sinus infection  . Diabetic foot ulcer ([place])   . Diabetic neuropathy ([place])   . ESRD (end stage renal disease) on dialysis [facility])    started 12/2012; "MWF; [place] "  (01/10/2017)  . GERD (gastroesophageal reflux disease)    hx "before I lost weight", no problem 9 years  . Hemodialysis access site with mature fistula ([place])   . Hemorrhoids, internal 10/2011   small  . High cholesterol   . History of blood transfusion    "related to the anemia"  . Hypertension   . Insomnia, unspecified   . Lacunar infarction ([place]) 2006   RUE/RLE, speech  . Long term (current) use of anticoagulants   . Myocardial infarction ([place]) 1995  . Orthostatic hypotension   . Osteomyelitis of foot, left, acute ([place])   . Other chronic postoperative pain   . Pneumonia    "probably twice" (01/10/2017)  . Polymyalgia rheumatica ([place])   . Renal insufficiency   . Sleep apnea    "  lost weight; no more problem" (01/10/2017)  . Stroke ([place]) 01/10/06   denies residual on 05/09/2014  . Type II diabetes mellitus ([place]) dx'd 1995  . Unspecified hereditary and idiopathic peripheral neuropathy    feet  . Unspecified osteomyelitis, site unspecified   . Unspecified vitamin D deficiency     Past Surgical History:  Procedure Laterality Date  . ABDOMINAL AORTOGRAM N/A 08/25/2016   Procedure: ABDOMINAL AORTOGRAM;  Surgeon: Wellington Hampshire, MD;  Location: [place] [facility];  Service: Cardiovascular;  Laterality: N/A;  . AMPUTATION  01/21/2012   Procedure: AMPUTATION RAY;  Surgeon: [name], MD;  Location: [place];  Service: Orthopedics;  Laterality: Left;  Left Foot 4th Ray Amputation  . AMPUTATION Left 05/04/2013   Procedure: AMPUTATION DIGIT;  Surgeon: [name], MD;  Location: [place];  Service: Orthopedics;  Laterality: Left;  Left Great Toe Amputation at [facility]  . AMPUTATION Left 01/14/2017   Procedure: AMPUTATION ABOVE LEFT KNEE;  Surgeon: [name], MD;  Location: [place];  Service: Orthopedics;  Laterality: Left;  . ANTERIOR CERVICAL DECOMP/DISCECTOMY FUSION  02/2011  . BACK SURGERY    . BASCILIC VEIN TRANSPOSITION Left 10/19/2012   Procedure: BASCILIC VEIN TRANSPOSITION;  Surgeon: Serafina Mitchell, MD;  Location: [place];  Service: Vascular;  Laterality: Left;  . CARDIAC CATHETERIZATION     "before bypass"  . CORONARY ARTERY BYPASS GRAFT     x 5 with lima at [place] WITH ANTIBIOTIC SPACERS Left 08/07/2014   Procedure: Replace Left Total Knee Arthroplasty,  Place Antibiotic Spacer;  Surgeon: [name], MD;  Location: [place];  Service: Orthopedics;  Laterality: Left;  . I&D EXTREMITY Left 05/09/2014   Procedure: Irrigation and Debridement Left Knee and Closure of Total Knee Arthroplasty Incision;  Surgeon: [name], MD;  Location: [place];  Service: Orthopedics;  Laterality: Left;  . I&D KNEE WITH POLY EXCHANGE Left 05/31/2014   Procedure: IRRIGATION AND DEBRIDEMENT LEFT KNEE, PLACE ANTIBIOTIC BEADS,  POLY EXCHANGE;  Surgeon: [name], MD;  Location: [place];  Service: Orthopedics;  Laterality: Left;  . IRRIGATION AND DEBRIDEMENT KNEE Left 01/12/2017   Procedure: IRRIGATION AND DEBRIDEMENT LEFT KNEE;  Surgeon: [name], MD;  Location: [place];  Service: Orthopedics;  Laterality: Left;  . JOINT REPLACEMENT    . KNEE ARTHROSCOPY Left 08-25-2012  . LOWER EXTREMITY  ANGIOGRAPHY Left 08/25/2016   Procedure: Lower Extremity Angiography;  Surgeon: Wellington Hampshire, MD;  Location: [place] [facility];  Service: Cardiovascular;  Laterality: Left;  . PERIPHERAL VASCULAR BALLOON ANGIOPLASTY Left 08/25/2016   Procedure: PERIPHERAL VASCULAR BALLOON ANGIOPLASTY;  Surgeon: Wellington Hampshire, MD;  Location: [place] [facility];  Service: Cardiovascular;  Laterality: Left;  lt peroneal and ant tibial arteries cutting balloon  . REFRACTIVE SURGERY Bilateral   . TOE AMPUTATION Bilateral    "I've lost 7 toes over the last 7 years" (05/09/2014)  . TOE SURGERY Left April 2015   Big toe removed on left foot.  . TONSILLECTOMY    . TOTAL KNEE ARTHROPLASTY Left 04/10/2014   Procedure: TOTAL KNEE ARTHROPLASTY;  Surgeon: [name], MD;  Location: [place];  Service: Orthopedics;  Laterality: Left;  . TOTAL KNEE REVISION Left 10/25/2014   Procedure: LEFT TOTAL KNEE REVISION;  Surgeon: [name], MD;  Location: [place];  Service: Orthopedics;  Laterality: Left;  . TOTAL KNEE REVISION Left 11/26/2015  Procedure: Removal Left Total Knee Arthroplasty, Hinged Total Knee Arthroplasty;  Surgeon: [name], MD;  Location: [place];  Service: Orthopedics;  Laterality: Left;  . UVULOPALATOPHARYNGOPLASTY, TONSILLECTOMY AND SEPTOPLASTY  ~ 1989  . WOUND DEBRIDEMENT Left 05/09/2014   Dehiscence Left Total Knee Arthroplasty Incision    Prior to Admission medications   Medication Sig Start Date End Date Taking? Authorizing Provider  amLODipine (NORVASC) 5 MG tablet Take 5 mg by mouth daily.    Yes [provider]  doxycycline (VIBRA-TABS) 100 MG tablet Take 100 mg by mouth 2 (two) times daily.   Yes [provider]  hydrOXYzine (ATARAX/VISTARIL) 25 MG tablet Take 25 mg by mouth 3 (three) times daily.   Yes [provider]  sevelamer carbonate (RENVELA) 800 MG tablet Take 2,400 mg by mouth 3 (three) times daily with meals.    Yes [provider]  temazepam  (RESTORIL) 30 MG capsule Take 30 mg by mouth at bedtime as needed for sleep.   Yes [provider]  Amino Acids-Protein Hydrolys (FEEDING SUPPLEMENT, PRO-STAT SUGAR FREE 64,) LIQD Take 30 mLs by mouth 2 (two) times daily. Patient not taking: Reported on 11/30/2017 01/19/17   Patrecia Pour, Christean Grief, MD  aspirin EC 81 MG EC tablet Take 2 tablets (162 mg total) by mouth daily. Patient not taking: Reported on 11/30/2017 01/20/17   Patrecia Pour, Christean Grief, MD  carvedilol (COREG) 12.5 MG tablet Take 1 tablet (12.5 mg total) by mouth 2 (two) times daily with a meal. Patient not taking: Reported on 07/10/2018 02/08/17   Bonnell Public, MD  Darbepoetin Alfa (ARANESP) 150 MCG/0.3ML SOSY injection Inject 0.3 mLs (150 mcg total) into the vein every Friday with hemodialysis. Patient not taking: Reported on 07/10/2018 02/11/17   Dana Allan I, MD  ferric citrate (AURYXIA) 1 GM 210 MG(Fe) tablet Take 2 tablets (420 mg total) by mouth 3 (three) times daily with meals. Patient not taking: Reported on 11/30/2017 02/08/17   Dana Allan I, MD  gabapentin (NEURONTIN) 300 MG capsule TAKE 1 CAPSULE BY MOUTH THREE TIMES A DAY Patient not taking: No sig reported 10/24/17   [name], MD  montelukast (SINGULAIR) 10 MG tablet TAKE 1 TABLET (10 MG TOTAL) BY MOUTH AT BEDTIME. Patient not taking: No sig reported 09/01/15   Gildardo Cranker, DO  multivitamin (RENA-VIT) TABS tablet Take 1 tablet by mouth at bedtime. Patient not taking: Reported on 07/10/2018 02/08/17   Bonnell Public, MD  Nutritional Supplements (FEEDING SUPPLEMENT, NEPRO CARB STEADY,) LIQD Take 237 mLs by mouth 2 (two) times daily between meals. Patient not taking: Reported on 11/30/2017 01/19/17   Patrecia Pour, Christean Grief, MD  ondansetron (ZOFRAN ODT) 4 MG disintegrating tablet Take 1 tablet (4 mg total) by mouth every 8 (eight) hours as needed for nausea or vomiting. Patient not taking: Reported on 07/10/2018 12/16/17   Kinnie Feil, PA-C   pentoxifylline (TRENTAL) 400 MG CR tablet TAKE 1 TABLET (400 MG TOTAL) BY MOUTH 3 (THREE) TIMES DAILY WITH MEALS. Patient not taking: Reported on 07/10/2018 02/22/18   [name], MD  traMADol (ULTRAM) 50 MG tablet Take 1 tablet (50 mg total) by mouth every 6 (six) hours as needed. Patient not taking: Reported on 07/10/2018 12/16/17   Kinnie Feil, PA-C    Current Facility-Administered Medications  Medication Dose Route Frequency Provider Last Rate Last Dose  . acetaminophen (TYLENOL) tablet 650 mg  650 mg Oral Q6H PRN Rise Patience, MD  Or  . acetaminophen (TYLENOL) suppository 650 mg  650 mg Rectal Q6H PRN Rise Patience, MD      . amLODipine (NORVASC) tablet 5 mg  5 mg Oral Daily Rise Patience, MD   5 mg at 07/11/18 0827  . aspirin EC tablet 81 mg  81 mg Oral Daily Rise Patience, MD   81 mg at 07/11/18 0830  . carvedilol (COREG) tablet 12.5 mg  12.5 mg Oral BID WC Rise Patience, MD   12.5 mg at 07/11/18 0829  . Chlorhexidine Gluconate Cloth 2 % PADS 6 each  6 each Topical Q0600 Valentina Gu, NP      . doxercalciferol (HECTOROL) injection 1 mcg  1 mcg Intravenous Q M,W,F-HD Valentina Gu, NP   1 mcg at 07/10/18 1436  . doxycycline (VIBRA-TABS) tablet 100 mg  100 mg Oral BID Rise Patience, MD   100 mg at 07/11/18 1610  . fentaNYL (SUBLIMAZE) injection 25 mcg  25 mcg Intravenous Q4H PRN Gardiner Barefoot, NP   25 mcg at 07/10/18 2031  . hydrOXYzine (ATARAX/VISTARIL) tablet 25 mg  25 mg Oral TID Rise Patience, MD   25 mg at 07/11/18 9604  . insulin aspart (novoLOG) injection 0-9 Units  0-9 Units Subcutaneous TID WC Rise Patience, MD      . MEDLINE mouth rinse  15 mL Mouth Rinse BID Domenic Polite, MD   15 mL at 07/11/18 0830  . ondansetron (ZOFRAN) tablet 4 mg  4 mg Oral Q6H PRN Rise Patience, MD       Or  . ondansetron [facility]) injection 4 mg  4 mg Intravenous Q6H PRN Rise Patience, MD      .  sevelamer carbonate (RENVELA) tablet 2,400 mg  2,400 mg Oral TID WC Valentina Gu, NP   2,400 mg at 07/11/18 1321  . temazepam (RESTORIL) capsule 30 mg  30 mg Oral QHS PRN Rise Patience, MD   30 mg at 07/10/18 2348  . traMADol (ULTRAM) tablet 50 mg  50 mg Oral Q6H PRN Gardiner Barefoot, NP        Allergies as of 07/09/2018 - Review Complete 07/09/2018  Allergen Reaction Noted  . Morphine and related Other (See Comments) 02/01/2012  . Tygacil [tigecycline] Nausea And Vomiting and Other (See Comments) 09/22/2011    Family History  Problem Relation Age of Onset  . Hypertension Mother   . Cancer Mother 37       Ovarian  . Heart disease Maternal Aunt   . Stroke Maternal Grandfather     Social History   Socioeconomic History  . Marital status: Legally Separated    Spouse name: Not on file  . Number of children: Not on file  . Years of education: Not on file  . Highest education level: Not on file  Occupational History  . Not on file  Social Needs  . Financial resource strain: Not on file  . Food insecurity    Worry: Not on file    Inability: Not on file  . Transportation needs    Medical: Not on file    Non-medical: Not on file  Tobacco Use  . Smoking status: Former Smoker    Packs/day: 0.12    Years: 32.00    Pack years: 3.84    Types: Cigarettes    Quit date: 10/05/2016    Years since quitting: 1.7  . Smokeless tobacco: Never Used  Substance and Sexual  Activity  . Alcohol use: No    Alcohol/week: 0.0 standard drinks  . Drug use: No  . Sexual activity: Not Currently  Lifestyle  . Physical activity    Days per week: Not on file    Minutes per session: Not on file  . Stress: Not on file  Relationships  . Social Herbalist on phone: Not on file    Gets together: Not on file    Attends religious service: Not on file    Active member of club or organization: Not on file    Attends meetings of clubs or organizations: Not on file     Relationship status: Not on file  . Intimate partner violence    Fear of current or ex partner: Not on file    Emotionally abused: Not on file    Physically abused: Not on file    Forced sexual activity: Not on file  Other Topics Concern  . Not on file  Social History Narrative  . Not on file    Review of Systems: Gen: No sweats or chills no anorexia positive for fatigue and weakness HEENT: No visual complaints, No history of Retinopathy. Normal external appearance No Epistaxis or Sore throat. No sinusitis.   CV: Denies chest pain, angina, palpitations, syncope, orthopnea, PND, peripheral edema, and claudication. Resp: Denies dyspnea at rest, dyspnea with exercise, cough, sputum, wheezing, coughing up blood, and pleurisy. GI: Denies vomiting blood, jaundice, and fecal incontinence.   Denies dysphagia or odynophagia bilateral inguinal hernias evaluated by GI not thought to need surgery emergently GU : Anuric renal failure end-stage renal disease MS: Muscle weakness cramps and atrophy noted use nonsteroidal inflammatory drugs Derm: Denies rash, itching, dry skin, hives, moles, warts, or unhealing ulcers.  Psych: Denies depression, anxiety, memory loss, suicidal ideation, hallucinations, paranoia, and confusion. Heme: Denies bruising, bleeding, and enlarged lymph nodes. Neuro: History of lacunar infarct Endocrine history of diabetes  Physical Exam: Vital signs in last 24 hours: Temp:  [97.5 F (36.4 C)-98.3 F (36.8 C)] 97.8 F (36.6 C) (06/30 1137) Pulse Rate:  [66-87] 66 (06/30 1137) Resp:  [17-19] 18 (06/30 1137) BP: (127-154)/(80-94) 127/80 (06/30 1137) SpO2:  [92 %-95 %] 93 % (06/30 1137) Weight:  [73.2 kg-73.4 kg] 73.4 kg (06/30 0518) Last BM Date: 07/10/18 General:   Nondistressed chronically ill-appearing gentleman Head:  Normocephalic and atraumatic. Eyes:  Sclera clear, no icterus.   Conjunctiva pink. Ears:  Normal auditory acuity. Nose:  No deformity, discharge,  or  lesions. Mouth:  No deformity or lesions, dentition normal. Neck:  Supple; no masses or thyromegaly. JVP not elevated Lungs:  Clear throughout to auscultation.   No wheezes, crackles, or rhonchi. No acute distress. Heart:  Regular rate and rhythm; 3/6 systolic murmur left sternal edge Abdomen:  Soft, nontender and nondistended. No masses, hepatosplenomegaly or hernias noted. Normal bowel sounds, without guarding, and without rebound.   Msk:  Symmetrical without gross deformities. Normal posture. Pulses:  No carotid, renal, femoral bruits. DP and PT symmetrical and equal Extremities: Left AKA no evidence of edema Neurologic:  Alert and  oriented x4;  grossly normal neurologically. Skin:  Intact without significant lesions or rashes.   Intake/Output from previous day: 06/29 0701 - 06/30 0700 In: 240 [P.O.:240] Out: 3000  Intake/Output this shift: Total I/O In: 120 [P.O.:120] Out: -   Lab Results: Recent Labs    07/09/18 1800 07/10/18 0455  WBC 3.9* 3.4*  HGB 10.2* 9.7*  HCT 32.7* 30.8*  PLT 139* 132*   BMET Recent Labs    07/09/18 1800 07/10/18 0455 07/11/18 0812  NA 139 135  135 135  K 4.7 4.6  4.5 4.2  CL 97* 94*  93* 96*  CO2 29 27  28 27   GLUCOSE 111* 119*  123* 100*  BUN 36* 41*  41* 26*  CREATININE 6.74* 7.00*  6.91* 5.51*  CALCIUM 9.9 9.4  9.2 9.2  PHOS  --  5.8*  --    LFT Recent Labs    07/10/18 0455  PROT 6.2*  ALBUMIN 3.0*  3.0*  AST 36  ALT 30  ALKPHOS 77  BILITOT 1.0  BILIDIR 0.3*  IBILI 0.7   PT/INR No results for input(s): LABPROT, INR in the last 72 hours. Hepatitis Panel No results for input(s): HEPBSAG, HCVAB, HEPAIGM, HEPBIGM in the last 72 hours.  Studies/Results: Ct Abdomen Pelvis Wo Contrast  Result Date: 07/09/2018 CLINICAL DATA:  Abdominal pain and vomiting 2 days. Bowel obstruction. Inguinal hernia. EXAM: CT ABDOMEN AND PELVIS WITHOUT CONTRAST TECHNIQUE: Multidetector CT imaging of the abdomen and pelvis was  performed following the standard protocol without IV contrast. COMPARISON:  01/02/2018 FINDINGS: Lower chest: New small bilateral pleural effusions and bibasilar atelectasis. Hepatobiliary: No mass visualized on this unenhanced exam. Tiny calcified gallstones are again seen, without definite evidence of cholecystitis. Pancreas: No mass or inflammatory process visualized on this unenhanced exam. Spleen:  Within normal limits in size. Adrenals/Urinary tract: Stable bilateral diffuse renal parenchymal atrophy. No evidence of renal masses or hydronephrosis. Diffuse renal vascular calcification noted. Stomach/Bowel: No evidence of obstruction or focal inflammatory process. A moderate size right inguinal hernia is seen containing several small bowel loops, however there is no evidence of obstruction or strangulation. A small left inguinal hernia is seen containing only fat. Vascular/Lymphatic: No pathologically enlarged lymph nodes identified. No evidence of abdominal aortic aneurysm. Aortic atherosclerosis. Reproductive:  No mass or other significant abnormality. Other: Mild ascites is new since previous study. Diffuse mesenteric and body wall edema is also new since prior exam. Musculoskeletal:  No suspicious bone lesions identified. IMPRESSION: 1. New mild ascites, diffuse mesenteric and body wall edema, and small bilateral pleural effusions, consistent with 3rd spacing. No focal inflammatory process or mass identified. 2. Increased sizes of right inguinal hernia containing several small bowel loops, and left inguinal hernia containing only fat. No evidence of bowel obstruction or strangulation. 3. Cholelithiasis. No radiographic evidence of cholecystitis. Electronically Signed   By: Marlaine Hind M.D.   On: 07/09/2018 21:13   Nm Hepato W/eject Fract  Result Date: 07/10/2018 CLINICAL DATA:  Cholelithiasis EXAM: NUCLEAR MEDICINE HEPATOBILIARY IMAGING WITH GALLBLADDER EF TECHNIQUE: Sequential images of the abdomen  were obtained out to 60 minutes following intravenous administration of radiopharmaceutical. After oral ingestion of Ensure, gallbladder ejection fraction was determined. At 60 min, normal ejection fraction is greater than 33%. RADIOPHARMACEUTICALS:  Five mCi Tc-14m  Choletec IV COMPARISON:  Abdominal CT from yesterday FINDINGS: Prompt uptake and biliary excretion of activity by the liver is seen. Gallbladder activity is visualized, consistent with patency of cystic duct. Biliary activity passes into small bowel, consistent with patent common bile duct. Calculated gallbladder ejection fraction is 27%. (Normal gallbladder ejection fraction with Ensure is greater than 33%.) IMPRESSION: Patent cystic and common bile ducts. Low gallbladder ejection fraction of 27%. Electronically Signed   By: Monte Fantasia M.D.   On: 07/10/2018 10:36   Dg Chest Portable 1 View  Result Date: 07/09/2018 CLINICAL DATA:  Pleural  effusions, history CHF, coronary artery disease post MI, diabetes mellitus, end-stage renal disease on dialysis, hypertension EXAM: PORTABLE CHEST 1 VIEW COMPARISON:  Portable exam 2047 hours compared to 02/15/2018 FINDINGS: Lordotic positioning. Enlargement of cardiac silhouette with pulmonary vascular congestion post median sternotomy. LEFT basilar atelectasis. Lungs otherwise clear. No definite infiltrate, pleural effusion or pneumothorax. Bones demineralized. IMPRESSION: Enlargement of cardiac silhouette with pulmonary vascular congestion. LEFT basilar atelectasis. Electronically Signed   By: Lavonia Dana M.D.   On: 07/09/2018 23:01   Ct Angio Abd/pel W And/or Wo Contrast  Result Date: 07/09/2018 CLINICAL DATA:  Abdominal pain.  Assess for mesenteric ischemia EXAM: CTA ABDOMEN AND PELVIS wITHOUT AND WITH CONTRAST TECHNIQUE: Multidetector CT imaging of the abdomen and pelvis was performed using the standard protocol during bolus administration of intravenous contrast. Multiplanar reconstructed images and  MIPs were obtained and reviewed to evaluate the vascular anatomy. CONTRAST:  158mL OMNIPAQUE IOHEXOL 350 MG/ML SOLN COMPARISON:  Noncontrast study performed earlier today. FINDINGS: VASCULAR Aorta: Heavily calcified.  No aneurysm or dissection. Celiac: Calcified, widely patent SMA: Calcified, widely patent Renals: Heavily calcified bilaterally. Probable hemodynamically significant stenosis within the proximal left renal artery, greater than 50%. IMA: Calcified with mild stenosis at the origin, likely approximately 50%. Inflow: Heavily calcified.  No aneurysm. Proximal Outflow: Heavily calcified. Vessels are poorly opacified which appears to be due to slow flow rather than focal stenosis. Veins: Grossly unremarkable. Review of the MIP images confirms the above findings. NON-VASCULAR Lower chest: Small to moderate bilateral pleural effusions. There is cardiomegaly. Bilateral lower lobe airspace opacities. Hepatobiliary: Small gallstones within the gallbladder. No focal hepatic abnormality. Pancreas: No focal abnormality or ductal dilatation. Spleen: No focal abnormality.  Normal size. Adrenals/Urinary Tract: Atrophic kidneys compatible with end-stage renal disease. No adrenal mass. Urinary bladder is decompressed, grossly unremarkable. Stomach/Bowel: Stomach, large and small bowel grossly unremarkable. Lymphatic: No adenopathy Reproductive: Moderate free fluid in the cul-de-sac and adjacent to the liver and spleen. Other: Bilateral inguinal hernias. The right inguinal hernia contains small bowel loops. No evidence of obstruction. Left inguinal hernia contains fat. Musculoskeletal: No acute bony abnormality. IMPRESSION: VASCULAR Heavily calcified aorta and branch vessels. Mild stenosis at the origin of the IMA. Celiac and SMA appear widely patent. Heavily calcified renal arteries bilaterally likely with hemodynamically significant stenosis within the proximal left renal artery. NON-VASCULAR Cholelithiasis. Moderate  ascites. Bilateral inguinal hernias, containing small bowel on the right and fat on the left. No evidence of bowel obstruction. Electronically Signed   By: Rolm Baptise M.D.   On: 07/09/2018 23:42    Assessment/Plan:  ESRD-Monday Wednesday Friday dialysis [facility].  ANEMIA-check iron studies evaluate for IV iron.  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

## 2018-07-12 DIAGNOSIS — K4000 Bilateral inguinal hernia, with obstruction, without gangrene, not specified as recurrent: Secondary | ICD-10-CM | POA: Diagnosis not present

## 2018-07-12 DIAGNOSIS — J9601 Acute respiratory failure with hypoxia: Secondary | ICD-10-CM | POA: Diagnosis present

## 2018-07-12 DIAGNOSIS — R188 Other ascites: Secondary | ICD-10-CM | POA: Diagnosis not present

## 2018-07-12 DIAGNOSIS — Z951 Presence of aortocoronary bypass graft: Secondary | ICD-10-CM | POA: Diagnosis not present

## 2018-07-12 DIAGNOSIS — E785 Hyperlipidemia, unspecified: Secondary | ICD-10-CM | POA: Diagnosis not present

## 2018-07-12 DIAGNOSIS — R1084 Generalized abdominal pain: Secondary | ICD-10-CM

## 2018-07-12 DIAGNOSIS — K219 Gastro-esophageal reflux disease without esophagitis: Secondary | ICD-10-CM | POA: Diagnosis not present

## 2018-07-12 DIAGNOSIS — I5032 Chronic diastolic (congestive) heart failure: Secondary | ICD-10-CM | POA: Diagnosis not present

## 2018-07-12 DIAGNOSIS — N186 End stage renal disease: Secondary | ICD-10-CM | POA: Diagnosis not present

## 2018-07-12 DIAGNOSIS — N2581 Secondary hyperparathyroidism of renal origin: Secondary | ICD-10-CM | POA: Diagnosis not present

## 2018-07-12 DIAGNOSIS — F419 Anxiety disorder, unspecified: Secondary | ICD-10-CM | POA: Diagnosis not present

## 2018-07-12 DIAGNOSIS — I2510 Atherosclerotic heart disease of native coronary artery without angina pectoris: Secondary | ICD-10-CM | POA: Diagnosis not present

## 2018-07-12 DIAGNOSIS — I252 Old myocardial infarction: Secondary | ICD-10-CM | POA: Diagnosis not present

## 2018-07-12 DIAGNOSIS — M353 Polymyalgia rheumatica: Secondary | ICD-10-CM | POA: Diagnosis not present

## 2018-07-12 DIAGNOSIS — E8770 Fluid overload, unspecified: Secondary | ICD-10-CM | POA: Diagnosis not present

## 2018-07-12 DIAGNOSIS — E1159 Type 2 diabetes mellitus with other circulatory complications: Secondary | ICD-10-CM | POA: Diagnosis not present

## 2018-07-12 DIAGNOSIS — Z1159 Encounter for screening for other viral diseases: Secondary | ICD-10-CM | POA: Diagnosis not present

## 2018-07-12 DIAGNOSIS — E1122 Type 2 diabetes mellitus with diabetic chronic kidney disease: Secondary | ICD-10-CM | POA: Diagnosis not present

## 2018-07-12 DIAGNOSIS — K8020 Calculus of gallbladder without cholecystitis without obstruction: Secondary | ICD-10-CM | POA: Diagnosis not present

## 2018-07-12 DIAGNOSIS — E1151 Type 2 diabetes mellitus with diabetic peripheral angiopathy without gangrene: Secondary | ICD-10-CM | POA: Diagnosis not present

## 2018-07-12 DIAGNOSIS — E1140 Type 2 diabetes mellitus with diabetic neuropathy, unspecified: Secondary | ICD-10-CM | POA: Diagnosis not present

## 2018-07-12 DIAGNOSIS — D696 Thrombocytopenia, unspecified: Secondary | ICD-10-CM | POA: Diagnosis not present

## 2018-07-12 DIAGNOSIS — I132 Hypertensive heart and chronic kidney disease with heart failure and with stage 5 chronic kidney disease, or end stage renal disease: Secondary | ICD-10-CM | POA: Diagnosis not present

## 2018-07-12 DIAGNOSIS — D631 Anemia in chronic kidney disease: Secondary | ICD-10-CM | POA: Diagnosis not present

## 2018-07-12 DIAGNOSIS — K529 Noninfective gastroenteritis and colitis, unspecified: Secondary | ICD-10-CM | POA: Diagnosis not present

## 2018-07-12 LAB — BASIC METABOLIC PANEL
Anion gap: 13 (ref 5–15)
BUN: 37 mg/dL — ABNORMAL HIGH (ref 6–20)
CO2: 26 mmol/L (ref 22–32)
Calcium: 9.5 mg/dL (ref 8.9–10.3)
Chloride: 96 mmol/L — ABNORMAL LOW (ref 98–111)
Creatinine, Ser: 6.36 mg/dL — ABNORMAL HIGH (ref 0.61–1.24)
GFR calc Af Amer: 10 mL/min — ABNORMAL LOW (ref >60)
GFR calc non Af Amer: 9 mL/min — ABNORMAL LOW (ref >60)
Glucose, Bld: 98 mg/dL (ref 70–99)
Potassium: 4.4 mmol/L (ref 3.5–5.1)
Sodium: 135 mmol/L (ref 135–145)

## 2018-07-12 LAB — CBC
HCT: 32.3 % — ABNORMAL LOW (ref 39.0–52.0)
Hemoglobin: 10.1 g/dL — ABNORMAL LOW (ref 13.0–17.0)
MCH: 30.9 pg (ref 26.0–34.0)
MCHC: 31.3 g/dL (ref 30.0–36.0)
MCV: 98.8 fL (ref 80.0–100.0)
Platelets: 119 10*3/uL — ABNORMAL LOW (ref 150–400)
RBC: 3.27 MIL/uL — ABNORMAL LOW (ref 4.22–5.81)
RDW: 14.9 % (ref 11.5–15.5)
WBC: 3.8 10*3/uL — ABNORMAL LOW (ref 4.0–10.5)
nRBC: 0.5 % — ABNORMAL HIGH (ref 0.0–0.2)

## 2018-07-12 LAB — RENAL FUNCTION PANEL
Albumin: 3.2 g/dL — ABNORMAL LOW (ref 3.5–5.0)
Anion gap: 12 (ref 5–15)
BUN: 38 mg/dL — ABNORMAL HIGH (ref 6–20)
CO2: 25 mmol/L (ref 22–32)
Calcium: 9.6 mg/dL (ref 8.9–10.3)
Chloride: 97 mmol/L — ABNORMAL LOW (ref 98–111)
Creatinine, Ser: 6.54 mg/dL — ABNORMAL HIGH (ref 0.61–1.24)
GFR calc Af Amer: 10 mL/min — ABNORMAL LOW (ref >60)
GFR calc non Af Amer: 8 mL/min — ABNORMAL LOW (ref >60)
Glucose, Bld: 82 mg/dL (ref 70–99)
Phosphorus: 5 mg/dL — ABNORMAL HIGH (ref 2.5–4.6)
Potassium: 4.3 mmol/L (ref 3.5–5.1)
Sodium: 134 mmol/L — ABNORMAL LOW (ref 135–145)

## 2018-07-12 LAB — GLUCOSE, CAPILLARY
Glucose-Capillary: 80 mg/dL (ref 70–99)
Glucose-Capillary: 76 mg/dL (ref 70–99)

## 2018-07-12 MED ORDER — DOXERCALCIFEROL 4 MCG/2ML IV SOLN
INTRAVENOUS | Status: AC
  Administered 2018-07-12: 1 ug via INTRAVENOUS
  Filled 2018-07-12: qty 2

## 2018-07-12 MED ORDER — LIDOCAINE-PRILOCAINE 2.5-2.5 % EX CREA: 1.0000 "application " | TOPICAL_CREAM | CUTANEOUS | Status: DC | PRN

## 2018-07-12 MED ORDER — HEPARIN SODIUM (PORCINE) 1000 UNIT/ML DIALYSIS: 1000.0000 [IU] | INTRAMUSCULAR | Status: DC | PRN

## 2018-07-12 MED ORDER — LIDOCAINE HCL (PF) 1 % IJ SOLN: 5.0000 mL | INTRAMUSCULAR | Status: DC | PRN

## 2018-07-12 MED ORDER — PENTAFLUOROPROP-TETRAFLUOROETH EX AERO: 1.0000 "application " | INHALATION_SPRAY | CUTANEOUS | Status: DC | PRN

## 2018-07-12 MED ORDER — ALTEPLASE 2 MG IJ SOLR: 2.0000 mg | Freq: Once | INTRAMUSCULAR | Status: DC | PRN

## 2018-07-12 MED ORDER — SODIUM CHLORIDE 0.9 % IV SOLN: 100.0000 mL | INTRAVENOUS | Status: DC | PRN

## 2018-07-12 MED ORDER — DARBEPOETIN ALFA 40 MCG/0.4ML IJ SOSY
40.0000 ug | PREFILLED_SYRINGE | INTRAMUSCULAR | Status: DC
  Administered 2018-07-12: 40 ug via INTRAVENOUS

## 2018-07-12 MED ORDER — DARBEPOETIN ALFA 40 MCG/0.4ML IJ SOSY
PREFILLED_SYRINGE | INTRAMUSCULAR | Status: AC
  Administered 2018-07-12: 40 ug via INTRAVENOUS
  Filled 2018-07-12: qty 0.4

## 2018-07-12 NOTE — Plan of Care (Signed)
  Problem: Education: Goal: Knowledge of General Education information will improve Description: Including pain rating scale, medication(s)/side effects and non-pharmacologic comfort measures Outcome: Adequate for Discharge   

## 2018-07-12 NOTE — Discharge Instructions (Signed)
Inguinal Hernia, Adult An inguinal hernia is when fat or your intestines push through a weak spot in a muscle where your leg meets your lower belly (groin). This causes a rounded lump (bulge). This kind of hernia could also be:  In your scrotum, if you are male.  In folds of skin around your vagina, if you are male. There are three types of inguinal hernias. These include:  Hernias that can be pushed back into the belly (are reducible). This type rarely causes pain.  Hernias that cannot be pushed back into the belly (are incarcerated).  Hernias that cannot be pushed back into the belly and lose their blood supply (are strangulated). This type needs emergency surgery. If you do not have symptoms, you may not need treatment. If you have symptoms or a large hernia, you may need surgery. Follow these instructions at home: Lifestyle  Do these things if told by your doctor so you do not have trouble pooping (constipation): ? Drink enough fluid to keep your pee (urine) pale yellow. ? Eat foods that have a lot of fiber. These include fresh fruits and vegetables, whole grains, and beans. ? Limit foods that are high in fat and processed sugars. These include foods that are fried or sweet. ? Take medicine for trouble pooping.  Avoid lifting heavy objects.  Avoid standing for long amounts of time.  Do not use any products that contain nicotine or tobacco. These include cigarettes and e-cigarettes. If you need help quitting, ask your doctor.  Stay at a healthy weight. General instructions  You may try to push your hernia in by very gently pressing on it when you are lying down. Do not try to force the bulge back in if it will not push in easily.  Watch your hernia for any changes in shape, size, or color. Tell your doctor if you see any changes.  Take over-the-counter and prescription medicines only as told by your doctor.  Keep all follow-up visits as told by your doctor. This is  important. Contact a doctor if:  You have a fever.  You have new symptoms.  Your symptoms get worse. Get help right away if:  The area where your leg meets your lower belly has: ? Pain that gets worse suddenly. ? A bulge that gets bigger suddenly, and it does not get smaller after that. ? A bulge that turns red or purple. ? A bulge that is painful when you touch it.  You are a man, and your scrotum: ? Suddenly feels painful. ? Suddenly changes in size.  You cannot push the hernia in by very gently pressing on it when you are lying down. Do not try to force the bulge back in if it will not push in easily.  You feel sick to your stomach (nauseous), and that feeling does not go away.  You throw up (vomit), and that keeps happening.  You have a fast heartbeat.  You cannot poop (have a bowel movement) or pass gas. These symptoms may be an emergency. Do not wait to see if the symptoms will go away. Get medical help right away. Call your local emergency services (911 in the [place] Summary  An inguinal hernia is when fat or your intestines push through a weak spot in a muscle where your leg meets your lower belly (groin). This causes a rounded lump (bulge).  If you do not have symptoms, you may not need treatment. If you have symptoms or a large hernia, you   may need surgery.  Avoid lifting heavy objects. Also avoid standing for long amounts of time.  Do not try to force the bulge back in if it will not push in easily. This information is not intended to replace advice given to you by your health care provider. Make sure you discuss any questions you have with your health care provider. Document Released: 01/28/2006 Document Revised: 01/29/2017 Document Reviewed: 09/29/2016 Elsevier Patient Education  2020 [employer]  

## 2018-07-12 NOTE — Progress Notes (Signed)
Renal Navigator notified OP HD clinic/NW of patient's hospitalization and negative COVID 19 rapid test result in order to provide continuity of care and safety.  Alphonzo Cruise, [place] Renal Navigator [phone]

## 2018-07-12 NOTE — Progress Notes (Signed)
[facility] ROUNDING NOTE   Subjective:   This is a 58 year old gentleman with a history of congestive heart failure.  Last 2D echo 50 to 55% ejection fraction 2019 and coronary artery disease status post CABG x3 05/18/2016.  He has a history of lacunar infarct in 2007 and a left AKA.  He is diabetic with diabetic neuropathy and has diabetic foot ulcers in the past.  He is end-stage renal disease Monday Wednesday Friday dialysis [facility] kidney center.  He was admitted 07/10/2018 bilateral inguinal hernias containing small bowel with a question of whether he had a small bowel obstruction.  He was evaluated by general surgery they felt there was no indication for repair at this time.  He is scheduled for dialysis 07/12/2018  Blood pressure 141/83 pulse 74 temperature 97.7 O2 sats 96% room air  Sodium 134 potassium 4.3 chloride 97 CO2 25 BUN 30 creatinine 6.54 glucose 82 calcium 9.6 phosphorus 5.0 albumin 3.2.  CT scan of abdomen showed cholelithiasis moderate ascites bilateral inguinal hernias containing small bowel on the right and fat on the left with no evidence of bowel obstruction 07/09/2018.  His nuclear medicine hepatobiliary scan showed a patent cystic and common bile duct with a global low gallbladder ejection fraction of 27%   Objective:  Vital signs in last 24 hours:  Temp:  [97.7 F (36.5 C)-97.9 F (36.6 C)] 97.7 F (36.5 C) (07/01 0715) Pulse Rate:  [66-73] 73 (07/01 0800) Resp:  [16-18] 18 (07/01 0715) BP: (127-146)/(80-94) 146/87 (07/01 0800) SpO2:  [91 %-99 %] 96 % (07/01 0715) Weight:  [74.5 kg-75.1 kg] 75.1 kg (07/01 0715)  Weight change: -2 kg Filed Weights   07/11/18 0518 07/12/18 0511 07/12/18 0715  Weight: 73.4 kg 74.5 kg 75.1 kg    Intake/Output: I/O last 3 completed shifts: In: 360 [P.O.:360] Out: 0    Intake/Output this shift:  No intake/output data recorded.  Alert and oriented nondistressed Cardiovascular regular rate and rhythm  no murmurs rubs or gallops Respiratory lung fields are clear to auscultation Abdomen soft nontender Extremities no evidence of edema AKA on the left   Basic Metabolic Panel: Recent Labs  Lab 07/09/18 1800 07/10/18 0455 07/11/18 0812 07/12/18 0354 07/12/18 0732  NA 139 135  135 135 135 134*  K 4.7 4.6  4.5 4.2 4.4 4.3  CL 97* 94*  93* 96* 96* 97*  CO2 29 27  28 27 26 25   GLUCOSE 111* 119*  123* 100* 98 82  BUN 36* 41*  41* 26* 37* 38*  CREATININE 6.74* 7.00*  6.91* 5.51* 6.36* 6.54*  CALCIUM 9.9 9.4  9.2 9.2 9.5 9.6  PHOS  --  5.8*  --   --  5.0*    Liver Function Tests: Recent Labs  Lab 07/09/18 1800 07/10/18 0455 07/12/18 0732  AST 25 36  --   ALT 20 30  --   ALKPHOS 84 77  --   BILITOT 1.1 1.0  --   PROT 6.7 6.2*  --   ALBUMIN 3.3* 3.0*  3.0* 3.2*   Recent Labs  Lab 07/09/18 1800  LIPASE 24   No results for input(s): AMMONIA in the last 168 hours.  CBC: Recent Labs  Lab 07/09/18 1800 07/10/18 0455  WBC 3.9* 3.4*  NEUTROABS 2.2 2.1  HGB 10.2* 9.7*  HCT 32.7* 30.8*  MCV 100.3* 98.4  PLT 139* 132*    Cardiac Enzymes: No results for input(s): CKTOTAL, CKMB, CKMBINDEX, TROPONINI in the last 168 hours.  BNP: Invalid input(s): POCBNP  CBG: Recent Labs  Lab 07/11/18 0608 07/11/18 1310 07/11/18 1619 07/11/18 2058 07/12/18 0604  GLUCAP 105* 123* 170* 98 80    Microbiology: Results for orders placed or performed during the hospital encounter of 07/09/18  SARS Coronavirus 2 (CEPHEID - Performed in [place] hospital lab), Hosp Order     Status: None   Collection Time: 07/10/18  1:15 AM   Specimen: Nasopharyngeal Swab  Result Value Ref Range Status   SARS Coronavirus 2 NEGATIVE NEGATIVE Final    Comment: (NOTE) If result is NEGATIVE SARS-CoV-2 target nucleic acids are NOT DETECTED. The SARS-CoV-2 RNA is generally detectable in upper and lower  respiratory specimens during the acute phase of infection. The lowest  concentration of  SARS-CoV-2 viral copies this assay can detect is 250  copies / mL. A negative result does not preclude SARS-CoV-2 infection  and should not be used as the sole basis for treatment or other  patient management decisions.  A negative result may occur with  improper specimen collection / handling, submission of specimen other  than nasopharyngeal swab, presence of viral mutation(s) within the  areas targeted by this assay, and inadequate number of viral copies  (<250 copies / mL). A negative result must be combined with clinical  observations, patient history, and epidemiological information. If result is POSITIVE SARS-CoV-2 target nucleic acids are DETECTED. The SARS-CoV-2 RNA is generally detectable in upper and lower  respiratory specimens dur ing the acute phase of infection.  Positive  results are indicative of active infection with SARS-CoV-2.  Clinical  correlation with patient history and other diagnostic information is  necessary to determine patient infection status.  Positive results do  not rule out bacterial infection or co-infection with other viruses. If result is PRESUMPTIVE POSTIVE SARS-CoV-2 nucleic acids MAY BE PRESENT.   A presumptive positive result was obtained on the submitted specimen  and confirmed on repeat testing.  While 2019 novel coronavirus  (SARS-CoV-2) nucleic acids may be present in the submitted sample  additional confirmatory testing may be necessary for epidemiological  and / or clinical management purposes  to differentiate between  SARS-CoV-2 and other Sarbecovirus currently known to infect humans.  If clinically indicated additional testing with an alternate test  methodology [phone]) is advised. The SARS-CoV-2 RNA is generally  detectable in upper and lower respiratory sp ecimens during the acute  phase of infection. The expected result is Negative. Fact Sheet for Patients:  StrictlyIdeas.no Fact Sheet for Healthcare  Providers: BankingDealers.co.za This test is not yet approved or cleared by the [place] FDA and has been authorized for detection and/or diagnosis of SARS-CoV-2 by FDA under an Emergency Use Authorization (EUA).  This EUA will remain in effect (meaning this test can be used) for the duration of the COVID-19 declaration under Section 564(b)(1) of the Act, 21 U.S.C. section 360bbb-3(b)(1), unless the authorization is terminated or revoked sooner. Performed at [facility], [place] [address], [place], [place] [address]   MRSA PCR Screening     Status: None   Collection Time: 07/10/18  5:59 PM   Specimen: Nasal Mucosa; Nasopharyngeal  Result Value Ref Range Status   MRSA by PCR NEGATIVE NEGATIVE Final    Comment:        The GeneXpert MRSA Assay (FDA approved for NASAL specimens only), is one component of a comprehensive MRSA colonization surveillance program. It is not intended to diagnose MRSA infection nor to guide or monitor treatment for  MRSA infections. Performed at [facility], [place] [address], [place], [place] [address]     Coagulation Studies: No results for input(s): LABPROT, INR in the last 72 hours.  Urinalysis: No results for input(s): COLORURINE, LABSPEC, PHURINE, GLUCOSEU, HGBUR, BILIRUBINUR, KETONESUR, PROTEINUR, UROBILINOGEN, NITRITE, LEUKOCYTESUR in the last 72 hours.  Invalid input(s): APPERANCEUR    Imaging: Nm Hepato W/eject Fract  Result Date: 07/10/2018 CLINICAL DATA:  Cholelithiasis EXAM: NUCLEAR MEDICINE HEPATOBILIARY IMAGING WITH GALLBLADDER EF TECHNIQUE: Sequential images of the abdomen were obtained out to 60 minutes following intravenous administration of radiopharmaceutical. After oral ingestion of Ensure, gallbladder ejection fraction was determined. At 60 min, normal ejection fraction is greater than 33%. RADIOPHARMACEUTICALS:  Five mCi Tc-70m  Choletec IV COMPARISON:  Abdominal CT from yesterday FINDINGS:  Prompt uptake and biliary excretion of activity by the liver is seen. Gallbladder activity is visualized, consistent with patency of cystic duct. Biliary activity passes into small bowel, consistent with patent common bile duct. Calculated gallbladder ejection fraction is 27%. (Normal gallbladder ejection fraction with Ensure is greater than 33%.) IMPRESSION: Patent cystic and common bile ducts. Low gallbladder ejection fraction of 27%. Electronically Signed   By: Monte Fantasia M.D.   On: 07/10/2018 10:36     Medications:   . sodium chloride    . sodium chloride     . amLODipine  5 mg Oral Daily  . aspirin EC  81 mg Oral Daily  . carvedilol  12.5 mg Oral BID WC  . Chlorhexidine Gluconate Cloth  6 each Topical Q0600  . doxercalciferol  1 mcg Intravenous Q M,W,F-HD  . doxycycline  100 mg Oral BID  . hydrOXYzine  25 mg Oral TID  . insulin aspart  0-9 Units Subcutaneous TID WC  . mouth rinse  15 mL Mouth Rinse BID  . sevelamer carbonate  2,400 mg Oral TID WC   sodium chloride, sodium chloride, acetaminophen **OR** acetaminophen, alteplase, heparin, lidocaine (PF), lidocaine-prilocaine, ondansetron **OR** ondansetron (ZOFRAN) IV, pentafluoroprop-tetrafluoroeth, temazepam, traMADol  Assessment/ Plan:   ESRD-Monday Wednesday Friday dialysis [facility] kidney center.  ANEMIA-iron saturations 46%. Does not look to have received any erythrocyte stimulating agent.  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

## 2018-07-12 NOTE — Progress Notes (Signed)
Patient is discharged he has not left yet Probation officer gave him his home meds that were locked up and discharge instructions,his PIV was removed without complications. He has been removed from tele and CCMD has been notified. He has ate his lunch and dressed himself he is waiting on his ride to come pick him up.

## 2018-07-15 NOTE — Discharge Summary (Signed)
[facility] Discharge Summary   Patient: Matthew Kline OEV:[id number]   PCP: [facility] DOB: December 24, 1960   Date of admission: 07/09/2018   Date of discharge: 07/12/2018     Discharge Diagnoses:  Principal diagnosis Anasarca secondary to missed hemodialysis in patient with ESRD and chronic diastolic CHF.  Principal Problem:   Abdominal pain Active Problems:   Polymyalgia rheumatica (HCC)   DM type 2 causing vascular disease ([place])   ESRD on dialysis ([place])   Type II diabetes mellitus ([place])   Anemia in chronic kidney disease   CAD, multiple vessel   Acute respiratory failure with hypoxia (HCC)   Bilateral recurrent inguinal hernia without obstruction or gangrene   Admitted From: Home Disposition:  Home   Recommendations for Outpatient Follow-up:  1. Please follow-up with PCP in 1 week. 2. Please follow-up with general surgery as recommended for hernia repair.  Follow-up [place]. Schedule an appointment as soon as possible for a visit in 1 week(s).   Contact information: [place] [address] [phone]        Rolm Bookbinder, MD. Schedule an appointment as soon as possible for a visit in 2 week(s).   Specialty: General Surgery Why: to discuss hernia repair Contact information: [address] [place] [address] [phone]          Diet recommendation: Renal diet  Activity: The patient is advised to gradually reintroduce usual activities,as tolerated .  Discharge Condition: good  Code Status: Full code  History of present illness: As per the H and P dictated on admission, "Matthew Kline is a 58 y.o. male with history of ESRD on hemodialysis on Monday Wednesday Friday, CAD status post CABG, hypertension, diabetes mellitus type 2 on diet, anemia, inguinal hernia recently evaluated by surgeon presents to the ER with complaint of abdominal pain mostly in the upper quadrants  last 3 days which is been progressive worsening with nausea but no vomiting.  Denies any chest pain or shortness of breath.  Denies any diarrhea.  Over the last 3 days patient has eaten minimally because of the pain and nausea.  Patient was recently placed on doxycycline 2 weeks ago for folliculitis involving the whole body by patient's dermatologist.  ED Course: In the ER patient had CT abdomen pelvis without contrast followed by CT angiogram of the abdomen and pelvis which shows nonobstructive inguinal hernia moderate ascites and gallstones.  Also there was pleural effusion and body wall edema.  While in the ER patient was mildly hypoxic requiring oxygen.  Patient admitted for abdominal pain and also need dialysis.  Labs show hemoglobin of 10.2 platelets 139 potassium 4.7.  Lipase was 24."  Hospital Course:  Summary of his active problems in the hospital is as following. #1. Abdominal pain likely related to volume overload as result of non-compliance with HD.  CT abdomen reveals bilateral inguinal hernia, non-obstructed with small bowel in the right, moderate ascites and gallstones.  HIDA scan 6/29 revealed patent cystic duct, mildly low gallbladder EF. He was dialyzed and pain improved.  #2. Bilateral inguinal hernia.  Evaluated by general surgery who opine no surgical intervention required now and recommend patient f/u with primary surgeon in [facility].  Patient requesting to be switched to general surgery here for his hernia treatment. Discussed with surgery and follow-up arranged. Reportedly patient had a cardiology clearance from primary cardiologist at [facility].  #3. ESRD a MWF dialysis patient. Hx  of intermittent non-compliance Will miss sessions or cut sessions short. -dialysis as noted above -appreciate nephrology assistance  #4. Acute respiratory failure with hypoxia.  Resolved. Oxygen saturation level 89% on room air on admission. Likely related to volume  overload -dialysis as noted above  #5. Hypertension fair control. Continue home regimen  #6. CAD. S/p cabg.  No chest pain.  -continue home meds  #7. Diabetes type II, controlled with renal complication  Diet controlled.  Follow-up with PCP outpatient. -monitor  #8 folliculitis.  OP diagnosis per dermatology.  -continue doxy  Pressure ulcer on right buttock Stage I, POA  Pressure Injury 01/29/17 Stage I -  Intact skin with non-blanchable redness of a localized area usually over a bony prominence. (Active)  01/29/17 2235  Location: Buttocks  Location Orientation: Posterior;Right;Left  Staging: Stage I -  Intact skin with non-blanchable redness of a localized area usually over a bony prominence.  Wound Description (Comments):   Present on Admission: Yes     Pain control  - [place] Controlled Substance Reporting System database was reviewed. - Patient was instructed, not to drive, operate heavy machinery, perform activities at heights, swimming or participation in water activities or provide baby sitting services while on Pain, Sleep and Anxiety Medications; until his outpatient Physician has advised to do so again.  - Also recommended to not to take more than prescribed Pain, Sleep and Anxiety Medications.  Patient was ambulatory without any assistance. On the day of the discharge the patient's vitals were stable, and no other acute medical condition were reported by patient. the patient was felt safe to be discharge at Home with family.  Consultants: General surgery nephrology Procedures: HD  DISCHARGE MEDICATION: Allergies as of 07/12/2018      Reactions   Morphine And Related Other (See Comments)   hallucinations   Tygacil [tigecycline] Nausea And Vomiting, Other (See Comments)   Makes him feel crazy      Medication List    TAKE these medications   amLODipine 5 MG tablet Commonly known as: NORVASC Take 5 mg by mouth daily. Notes to patient: 07/13/18@8   am   aspirin 81 MG EC tablet Take 2 tablets (162 mg total) by mouth daily. Notes to patient: 07/13/18@8am    carvedilol 12.5 MG tablet Commonly known as: COREG Take 1 tablet (12.5 mg total) by mouth 2 (two) times daily with a meal. Notes to patient: 07/12/18@5  pm   Darbepoetin Alfa 150 MCG/0.3ML Sosy injection Commonly known as: ARANESP Inject 0.3 mLs (150 mcg total) into the vein every Friday with hemodialysis. Notes to patient: Take every Friday with HMD tx.   doxycycline 100 MG tablet Commonly known as: VIBRA-TABS Take 100 mg by mouth 2 (two) times daily. Notes to patient: 07/12/18@5  pm   feeding supplement (NEPRO CARB STEADY) Liqd Take 237 mLs by mouth 2 (two) times daily between meals. Notes to patient: Take 07/12/18@ 8 pm   feeding supplement (PRO-STAT SUGAR FREE 64) Liqd Take 30 mLs by mouth 2 (two) times daily. Notes to patient: 07/12/18@5  pm   ferric citrate 1 GM 210 MG(Fe) tablet Commonly known as: AURYXIA Take 2 tablets (420 mg total) by mouth 3 (three) times daily with meals. Notes to patient: 07/12/18@5  pm   gabapentin 300 MG capsule Commonly known as: NEURONTIN TAKE 1 CAPSULE BY MOUTH THREE TIMES A DAY Notes to patient: 07/12/18@5  pm   hydrOXYzine 25 MG tablet Commonly known as: ATARAX/VISTARIL Take 25 mg by mouth 3 (three) times daily. Notes to patient: 07/12/18@5   pm   montelukast 10 MG tablet Commonly known as: SINGULAIR TAKE 1 TABLET (10 MG TOTAL) BY MOUTH AT BEDTIME. Notes to patient: 07/12/18@8  pm   multivitamin Tabs tablet Take 1 tablet by mouth at bedtime. Notes to patient: 07/12/18@8  pm   ondansetron 4 MG disintegrating tablet Commonly known as: Zofran ODT Take 1 tablet (4 mg total) by mouth every 8 (eight) hours as needed for nausea or vomiting.   pentoxifylline 400 MG CR tablet Commonly known as: TRENTAL TAKE 1 TABLET (400 MG TOTAL) BY MOUTH 3 (THREE) TIMES DAILY WITH MEALS. Notes to patient: 07/12/18@5  pm   sevelamer carbonate 800  MG tablet Commonly known as: RENVELA Take 2,400 mg by mouth 3 (three) times daily with meals. Notes to patient: 07/12/18@5  pm   temazepam 30 MG capsule Commonly known as: RESTORIL Take 30 mg by mouth at bedtime as needed for sleep.   traMADol 50 MG tablet Commonly known as: ULTRAM Take 1 tablet (50 mg total) by mouth every 6 (six) hours as needed.      Allergies  Allergen Reactions   Morphine And Related Other (See Comments)    hallucinations   Tygacil [Tigecycline] Nausea And Vomiting and Other (See Comments)    Makes him feel crazy   Discharge Instructions    Diet - low sodium heart healthy   Complete by: As directed    Discharge instructions   Complete by: As directed    It is important that you read the given instructions as well as go over your medication list with RN to help you understand your care after this hospitalization.  Discharge Instructions: Please follow-up with PCP in 1-2 weeks  Please request your primary care physician to go over all Hospital Tests and Procedure/Radiological results at the follow up. Please get all Hospital records sent to your PCP by signing hospital release before you go home.   Do not take more than prescribed Pain, Sleep and Anxiety Medications. You were cared for by a hospitalist during your hospital stay. If you have any questions about your discharge medications or the care you received while you were in the hospital after you are discharged, you can call the unit @UNIT @ you were admitted to and ask to speak with the hospitalist on call if the hospitalist that took care of you is not available.  Once you are discharged, your primary care physician will handle any further medical issues. Please note that NO REFILLS for any discharge medications will be authorized once you are discharged, as it is imperative that you return to your primary care physician (or establish a relationship with a primary care physician if you do not have one)  for your aftercare needs so that they can reassess your need for medications and monitor your lab values. You Must read complete instructions/literature along with all the possible adverse reactions/side effects for all the Medicines you take and that have been prescribed to you. Take any new Medicines after you have completely understood and accept all the possible adverse reactions/side effects. Wear Seat belts while driving. If you have smoked or chewed Tobacco in the last 2 yrs please stop smoking and/or stop any Recreational drug use.  If you drink alcohol, please STOP the use and do not drive, operating heavy machinery, perform activities at heights, swimming or participation in water activities or provide baby sitting services under influence.   Increase activity slowly   Complete by: As directed      Discharge Exam: Filed Weights  07/12/18 0511 07/12/18 0715 07/12/18 1022  Weight: 74.5 kg 75.1 kg 71.7 kg   Vitals:   07/12/18 1000 07/12/18 1022  BP: (!) 145/84 (!) 144/84  Pulse: 75 76  Resp:  18  Temp:  97.8 F (36.6 C)  SpO2:  96%   General: Appear in no distress, no Rash; Oral Mucosa Clear, moist. no Abnormal Mass Or lumps Cardiovascular: S1 and S2 Present, no Murmur, Respiratory: normal respiratory effort, Bilateral Air entry present and Clear to Auscultation, no Crackles, no wheezes Abdomen: Bowel Sound present, Soft and no tenderness, right inguinal hernia reducible Extremities: no Pedal edema, no calf tenderness, left B KA Neurology: alert and oriented to time, place, and person affect appropriate. normal without focal findings, mental status, speech normal, alert and oriented x3, PERLA, Motor strength 5/5 and symmetric and sensation grossly normal to light touch   The results of significant diagnostics from this hospitalization (including imaging, microbiology, ancillary and laboratory) are listed below for reference.    Significant Diagnostic Studies: Ct Abdomen  Pelvis Wo Contrast  Result Date: 07/09/2018 CLINICAL DATA:  Abdominal pain and vomiting 2 days. Bowel obstruction. Inguinal hernia. EXAM: CT ABDOMEN AND PELVIS WITHOUT CONTRAST TECHNIQUE: Multidetector CT imaging of the abdomen and pelvis was performed following the standard protocol without IV contrast. COMPARISON:  01/02/2018 FINDINGS: Lower chest: New small bilateral pleural effusions and bibasilar atelectasis. Hepatobiliary: No mass visualized on this unenhanced exam. Tiny calcified gallstones are again seen, without definite evidence of cholecystitis. Pancreas: No mass or inflammatory process visualized on this unenhanced exam. Spleen:  Within normal limits in size. Adrenals/Urinary tract: Stable bilateral diffuse renal parenchymal atrophy. No evidence of renal masses or hydronephrosis. Diffuse renal vascular calcification noted. Stomach/Bowel: No evidence of obstruction or focal inflammatory process. A moderate size right inguinal hernia is seen containing several small bowel loops, however there is no evidence of obstruction or strangulation. A small left inguinal hernia is seen containing only fat. Vascular/Lymphatic: No pathologically enlarged lymph nodes identified. No evidence of abdominal aortic aneurysm. Aortic atherosclerosis. Reproductive:  No mass or other significant abnormality. Other: Mild ascites is new since previous study. Diffuse mesenteric and body wall edema is also new since prior exam. Musculoskeletal:  No suspicious bone lesions identified. IMPRESSION: 1. New mild ascites, diffuse mesenteric and body wall edema, and small bilateral pleural effusions, consistent with 3rd spacing. No focal inflammatory process or mass identified. 2. Increased sizes of right inguinal hernia containing several small bowel loops, and left inguinal hernia containing only fat. No evidence of bowel obstruction or strangulation. 3. Cholelithiasis. No radiographic evidence of cholecystitis. Electronically Signed    By: Marlaine Hind M.D.   On: 07/09/2018 21:13   Nm Hepato W/eject Fract  Result Date: 07/10/2018 CLINICAL DATA:  Cholelithiasis EXAM: NUCLEAR MEDICINE HEPATOBILIARY IMAGING WITH GALLBLADDER EF TECHNIQUE: Sequential images of the abdomen were obtained out to 60 minutes following intravenous administration of radiopharmaceutical. After oral ingestion of Ensure, gallbladder ejection fraction was determined. At 60 min, normal ejection fraction is greater than 33%. RADIOPHARMACEUTICALS:  Five mCi Tc-31m  Choletec IV COMPARISON:  Abdominal CT from yesterday FINDINGS: Prompt uptake and biliary excretion of activity by the liver is seen. Gallbladder activity is visualized, consistent with patency of cystic duct. Biliary activity passes into small bowel, consistent with patent common bile duct. Calculated gallbladder ejection fraction is 27%. (Normal gallbladder ejection fraction with Ensure is greater than 33%.) IMPRESSION: Patent cystic and common bile ducts. Low gallbladder ejection fraction of 27%. Electronically Signed  By: Monte Fantasia M.D.   On: 07/10/2018 10:36   Dg Chest Portable 1 View  Result Date: 07/09/2018 CLINICAL DATA:  Pleural effusions, history CHF, coronary artery disease post MI, diabetes mellitus, end-stage renal disease on dialysis, hypertension EXAM: PORTABLE CHEST 1 VIEW COMPARISON:  Portable exam 2047 hours compared to 02/15/2018 FINDINGS: Lordotic positioning. Enlargement of cardiac silhouette with pulmonary vascular congestion post median sternotomy. LEFT basilar atelectasis. Lungs otherwise clear. No definite infiltrate, pleural effusion or pneumothorax. Bones demineralized. IMPRESSION: Enlargement of cardiac silhouette with pulmonary vascular congestion. LEFT basilar atelectasis. Electronically Signed   By: Lavonia Dana M.D.   On: 07/09/2018 23:01   Ct Angio Abd/pel W And/or Wo Contrast  Result Date: 07/09/2018 CLINICAL DATA:  Abdominal pain.  Assess for mesenteric ischemia EXAM:  CTA ABDOMEN AND PELVIS wITHOUT AND WITH CONTRAST TECHNIQUE: Multidetector CT imaging of the abdomen and pelvis was performed using the standard protocol during bolus administration of intravenous contrast. Multiplanar reconstructed images and MIPs were obtained and reviewed to evaluate the vascular anatomy. CONTRAST:  146mL OMNIPAQUE IOHEXOL 350 MG/ML SOLN COMPARISON:  Noncontrast study performed earlier today. FINDINGS: VASCULAR Aorta: Heavily calcified.  No aneurysm or dissection. Celiac: Calcified, widely patent SMA: Calcified, widely patent Renals: Heavily calcified bilaterally. Probable hemodynamically significant stenosis within the proximal left renal artery, greater than 50%. IMA: Calcified with mild stenosis at the origin, likely approximately 50%. Inflow: Heavily calcified.  No aneurysm. Proximal Outflow: Heavily calcified. Vessels are poorly opacified which appears to be due to slow flow rather than focal stenosis. Veins: Grossly unremarkable. Review of the MIP images confirms the above findings. NON-VASCULAR Lower chest: Small to moderate bilateral pleural effusions. There is cardiomegaly. Bilateral lower lobe airspace opacities. Hepatobiliary: Small gallstones within the gallbladder. No focal hepatic abnormality. Pancreas: No focal abnormality or ductal dilatation. Spleen: No focal abnormality.  Normal size. Adrenals/Urinary Tract: Atrophic kidneys compatible with end-stage renal disease. No adrenal mass. Urinary bladder is decompressed, grossly unremarkable. Stomach/Bowel: Stomach, large and small bowel grossly unremarkable. Lymphatic: No adenopathy Reproductive: Moderate free fluid in the cul-de-sac and adjacent to the liver and spleen. Other: Bilateral inguinal hernias. The right inguinal hernia contains small bowel loops. No evidence of obstruction. Left inguinal hernia contains fat. Musculoskeletal: No acute bony abnormality. IMPRESSION: VASCULAR Heavily calcified aorta and branch vessels. Mild  stenosis at the origin of the IMA. Celiac and SMA appear widely patent. Heavily calcified renal arteries bilaterally likely with hemodynamically significant stenosis within the proximal left renal artery. NON-VASCULAR Cholelithiasis. Moderate ascites. Bilateral inguinal hernias, containing small bowel on the right and fat on the left. No evidence of bowel obstruction. Electronically Signed   By: Rolm Baptise M.D.   On: 07/09/2018 23:42    Microbiology: Recent Results (from the past 240 hour(s))  SARS Coronavirus 2 (CEPHEID - Performed in [facility] hospital lab), Hosp Order     Status: None   Collection Time: 07/10/18  1:15 AM   Specimen: Nasopharyngeal Swab  Result Value Ref Range Status   SARS Coronavirus 2 NEGATIVE NEGATIVE Final    Comment: (NOTE) If result is NEGATIVE SARS-CoV-2 target nucleic acids are NOT DETECTED. The SARS-CoV-2 RNA is generally detectable in upper and lower  respiratory specimens during the acute phase of infection. The lowest  concentration of SARS-CoV-2 viral copies this assay can detect is 250  copies / mL. A negative result does not preclude SARS-CoV-2 infection  and should not be used as the sole basis for treatment or other  patient management decisions.  A negative result may occur with  improper specimen collection / handling, submission of specimen other  than nasopharyngeal swab, presence of viral mutation(s) within the  areas targeted by this assay, and inadequate number of viral copies  (<250 copies / mL). A negative result must be combined with clinical  observations, patient history, and epidemiological information. If result is POSITIVE SARS-CoV-2 target nucleic acids are DETECTED. The SARS-CoV-2 RNA is generally detectable in upper and lower  respiratory specimens dur ing the acute phase of infection.  Positive  results are indicative of active infection with SARS-CoV-2.  Clinical  correlation with patient history and other diagnostic  information is  necessary to determine patient infection status.  Positive results do  not rule out bacterial infection or co-infection with other viruses. If result is PRESUMPTIVE POSTIVE SARS-CoV-2 nucleic acids MAY BE PRESENT.   A presumptive positive result was obtained on the submitted specimen  and confirmed on repeat testing.  While 2019 novel coronavirus  (SARS-CoV-2) nucleic acids may be present in the submitted sample  additional confirmatory testing may be necessary for epidemiological  and / or clinical management purposes  to differentiate between  SARS-CoV-2 and other Sarbecovirus currently known to infect humans.  If clinically indicated additional testing with an alternate test  methodology [phone]) is advised. The SARS-CoV-2 RNA is generally  detectable in upper and lower respiratory sp ecimens during the acute  phase of infection. The expected result is Negative. Fact Sheet for Patients:  StrictlyIdeas.no Fact Sheet for Healthcare Providers: BankingDealers.co.za This test is not yet approved or cleared by the [place] FDA and has been authorized for detection and/or diagnosis of SARS-CoV-2 by FDA under an Emergency Use Authorization (EUA).  This EUA will remain in effect (meaning this test can be used) for the duration of the COVID-19 declaration under Section 564(b)(1) of the Act, 21 U.S.C. section 360bbb-3(b)(1), unless the authorization is terminated or revoked sooner. Performed at [facility], [place] [address], [place], [place] [address]   MRSA PCR Screening     Status: None   Collection Time: 07/10/18  5:59 PM   Specimen: Nasal Mucosa; Nasopharyngeal  Result Value Ref Range Status   MRSA by PCR NEGATIVE NEGATIVE Final    Comment:        The GeneXpert MRSA Assay (FDA approved for NASAL specimens only), is one component of a comprehensive MRSA colonization surveillance program. It is not intended to  diagnose MRSA infection nor to guide or monitor treatment for MRSA infections. Performed at [facility], [place] [address], [place], [place] [address]      Labs: CBC: Recent Labs  Lab 07/09/18 1800 07/10/18 0455 07/12/18 0732  WBC 3.9* 3.4* 3.8*  NEUTROABS 2.2 2.1  --   HGB 10.2* 9.7* 10.1*  HCT 32.7* 30.8* 32.3*  MCV 100.3* 98.4 98.8  PLT 139* 132* 935*   Basic Metabolic Panel: Recent Labs  Lab 07/09/18 1800 07/10/18 0455 07/11/18 0812 07/12/18 0354 07/12/18 0732  NA 139 135   135 135 135 134*  K 4.7 4.6   4.5 4.2 4.4 4.3  CL 97* 94*   93* 96* 96* 97*  CO2 29 27   28 27 26 25   GLUCOSE 111* 119*   123* 100* 98 82  BUN 36* 41*   41* 26* 37* 38*  CREATININE 6.74* 7.00*   6.91* 5.51* 6.36* 6.54*  CALCIUM 9.9 9.4   9.2 9.2 9.5 9.6  PHOS  --  5.8*  --   --  5.0*   Liver Function Tests: Recent Labs  Lab 07/09/18 1800 07/10/18 0455 07/12/18 0732  AST 25 36  --   ALT 20 30  --   ALKPHOS 84 77  --   BILITOT 1.1 1.0  --   PROT 6.7 6.2*  --   ALBUMIN 3.3* 3.0*   3.0* 3.2*   Recent Labs  Lab 07/09/18 1800  LIPASE 24   No results for input(s): AMMONIA in the last 168 hours. Cardiac Enzymes: No results for input(s): CKTOTAL, CKMB, CKMBINDEX, TROPONINI in the last 168 hours. BNP (last 3 results) No results for input(s): BNP in the last 8760 hours. CBG: Recent Labs  Lab 07/11/18 1310 07/11/18 1619 07/11/18 2058 07/12/18 0604 07/12/18 1213  GLUCAP 123* 170* 98 80 76   Time spent: 35 minutes  Signed:  Berle Mull  [facility] 07/12/2018

## 2018-07-26 ENCOUNTER — Other Ambulatory Visit: Payer: Self-pay

## 2018-07-26 ENCOUNTER — Emergency Department (HOSPITAL_COMMUNITY)
Admission: EM | Admit: 2018-07-26 | Discharge: 2018-07-26 | Disposition: A | Discharge status: Home / Self Care | Payer: Medicare HMO | Attending: Emergency Medicine | Admitting: Emergency Medicine

## 2018-07-26 ENCOUNTER — Encounter (HOSPITAL_COMMUNITY): Payer: Self-pay

## 2018-07-26 DIAGNOSIS — N186 End stage renal disease: Secondary | ICD-10-CM | POA: Diagnosis not present

## 2018-07-26 DIAGNOSIS — Z79899 Other long term (current) drug therapy: Secondary | ICD-10-CM | POA: Insufficient documentation

## 2018-07-26 DIAGNOSIS — Z7982 Long term (current) use of aspirin: Secondary | ICD-10-CM | POA: Insufficient documentation

## 2018-07-26 DIAGNOSIS — E1151 Type 2 diabetes mellitus with diabetic peripheral angiopathy without gangrene: Secondary | ICD-10-CM | POA: Diagnosis not present

## 2018-07-26 DIAGNOSIS — Z87891 Personal history of nicotine dependence: Secondary | ICD-10-CM | POA: Insufficient documentation

## 2018-07-26 DIAGNOSIS — R109 Unspecified abdominal pain: Secondary | ICD-10-CM | POA: Diagnosis not present

## 2018-07-26 DIAGNOSIS — I5032 Chronic diastolic (congestive) heart failure: Secondary | ICD-10-CM | POA: Diagnosis not present

## 2018-07-26 DIAGNOSIS — E1143 Type 2 diabetes mellitus with diabetic autonomic (poly)neuropathy: Secondary | ICD-10-CM | POA: Insufficient documentation

## 2018-07-26 DIAGNOSIS — M353 Polymyalgia rheumatica: Secondary | ICD-10-CM | POA: Diagnosis not present

## 2018-07-26 DIAGNOSIS — E1122 Type 2 diabetes mellitus with diabetic chronic kidney disease: Secondary | ICD-10-CM | POA: Insufficient documentation

## 2018-07-26 DIAGNOSIS — I132 Hypertensive heart and chronic kidney disease with heart failure and with stage 5 chronic kidney disease, or end stage renal disease: Secondary | ICD-10-CM | POA: Insufficient documentation

## 2018-07-26 DIAGNOSIS — R197 Diarrhea, unspecified: Secondary | ICD-10-CM | POA: Insufficient documentation

## 2018-07-26 DIAGNOSIS — Z992 Dependence on renal dialysis: Secondary | ICD-10-CM | POA: Diagnosis not present

## 2018-07-26 DIAGNOSIS — I25810 Atherosclerosis of coronary artery bypass graft(s) without angina pectoris: Secondary | ICD-10-CM | POA: Insufficient documentation

## 2018-07-26 LAB — COMPREHENSIVE METABOLIC PANEL
ALT: 11 U/L (ref 0–44)
AST: 18 U/L (ref 15–41)
Albumin: 3.4 g/dL — ABNORMAL LOW (ref 3.5–5.0)
Alkaline Phosphatase: 88 U/L (ref 38–126)
Anion gap: 14 (ref 5–15)
BUN: 43 mg/dL — ABNORMAL HIGH (ref 6–20)
CO2: 25 mmol/L (ref 22–32)
Calcium: 9.8 mg/dL (ref 8.9–10.3)
Chloride: 98 mmol/L (ref 98–111)
Creatinine, Ser: 8.51 mg/dL — ABNORMAL HIGH (ref 0.61–1.24)
GFR calc Af Amer: 7 mL/min — ABNORMAL LOW (ref >60)
GFR calc non Af Amer: 6 mL/min — ABNORMAL LOW (ref >60)
Glucose, Bld: 114 mg/dL — ABNORMAL HIGH (ref 70–99)
Potassium: 5.4 mmol/L — ABNORMAL HIGH (ref 3.5–5.1)
Sodium: 137 mmol/L (ref 135–145)
Total Bilirubin: 1.3 mg/dL — ABNORMAL HIGH (ref 0.3–1.2)
Total Protein: 6.9 g/dL (ref 6.5–8.1)

## 2018-07-26 LAB — CBC
HCT: 34.7 % — ABNORMAL LOW (ref 39.0–52.0)
Hemoglobin: 10.9 g/dL — ABNORMAL LOW (ref 13.0–17.0)
MCH: 30.9 pg (ref 26.0–34.0)
MCHC: 31.4 g/dL (ref 30.0–36.0)
MCV: 98.3 fL (ref 80.0–100.0)
Platelets: 112 10*3/uL — ABNORMAL LOW (ref 150–400)
RBC: 3.53 MIL/uL — ABNORMAL LOW (ref 4.22–5.81)
RDW: 14.8 % (ref 11.5–15.5)
WBC: 3.9 10*3/uL — ABNORMAL LOW (ref 4.0–10.5)
nRBC: 0 % (ref 0.0–0.2)

## 2018-07-26 LAB — LIPASE, BLOOD: Lipase: 23 U/L (ref 11–51)

## 2018-07-26 LAB — C DIFFICILE QUICK SCREEN W PCR REFLEX
C Diff antigen: NEGATIVE
C Diff interpretation: NOT DETECTED
C Diff toxin: NEGATIVE

## 2018-07-26 MED ORDER — DICYCLOMINE HCL 20 MG PO TABS: 20.0000 mg | ORAL_TABLET | Freq: Four times a day (QID) | ORAL | 0 refills | Status: DC | PRN

## 2018-07-26 MED ORDER — DIPHENHYDRAMINE HCL 50 MG/ML IJ SOLN
25.0000 mg | Freq: Once | INTRAMUSCULAR | Status: AC
  Administered 2018-07-26: 25 mg via INTRAVENOUS
  Filled 2018-07-26: qty 1

## 2018-07-26 MED ORDER — LOPERAMIDE HCL 2 MG PO CAPS
4.0000 mg | ORAL_CAPSULE | Freq: Once | ORAL | Status: AC
  Administered 2018-07-26: 13:00:00 4 mg via ORAL
  Filled 2018-07-26: qty 2

## 2018-07-26 MED ORDER — HYDROMORPHONE HCL 1 MG/ML IJ SOLN
0.5000 mg | Freq: Once | INTRAMUSCULAR | Status: AC
  Administered 2018-07-26: 13:00:00 0.5 mg via INTRAVENOUS
  Filled 2018-07-26: qty 1

## 2018-07-26 NOTE — Discharge Instructions (Signed)
Take imodium as needed for your diarrhea and prescribed medication for abdominal pain. You need to arrange for dialysis as soon as you can. There is no need for hospitalization for emergent dialysis at this time. Your diarrhea may be related to your extended antibiotic usage. I would taking probiotics or trying live culture yogurt.

## 2018-07-26 NOTE — ED Triage Notes (Signed)
Pt from home. Abdominal pain/(stomach ache) from belly button radiating to back, diarrhea started Sunday. No blood noted. N/V since Sunday. Pt states he can't keep anything down. Dialys Mon, Wed, Fri. Missed Mon and Wed dialysis.

## 2018-07-26 NOTE — ED Notes (Addendum)
Pt aware a stool sample is needed and will provide one when able. Pt states he occasionally makes urine.

## 2018-07-26 NOTE — Progress Notes (Signed)
Renal Navigator received notification from Nephrologist/Dr. Moshe Cipro that patient is in ED. Renal Navigator has obtained a rescheduled seat time for patient at his OP HD clinic/[facility] for tomorrow, 07/27/18 at 12:00pm should he be discharged from ED today. He needs to arrive at 11:40am. Dr. Moshe Cipro notified. Renal Navigator will follow up with clinic tomorrow to notify whether or not patient was admitted today.  Alphonzo Cruise, [place] Renal Navigator [phone]

## 2018-07-27 LAB — GASTROINTESTINAL PANEL BY PCR, STOOL (REPLACES STOOL CULTURE)

## 2018-07-31 ENCOUNTER — Emergency Department (HOSPITAL_COMMUNITY): Payer: Medicare HMO

## 2018-07-31 ENCOUNTER — Observation Stay (HOSPITAL_COMMUNITY)
Admission: EM | Admit: 2018-07-31 | Discharge: 2018-08-04 | Disposition: A | Discharge status: Home Health | Payer: Medicare HMO | Attending: Internal Medicine | Admitting: Internal Medicine

## 2018-07-31 ENCOUNTER — Other Ambulatory Visit: Payer: Self-pay

## 2018-07-31 ENCOUNTER — Encounter (HOSPITAL_COMMUNITY): Payer: Self-pay | Admitting: Emergency Medicine

## 2018-07-31 DIAGNOSIS — Z79899 Other long term (current) drug therapy: Secondary | ICD-10-CM | POA: Insufficient documentation

## 2018-07-31 DIAGNOSIS — Z951 Presence of aortocoronary bypass graft: Secondary | ICD-10-CM | POA: Insufficient documentation

## 2018-07-31 DIAGNOSIS — N2581 Secondary hyperparathyroidism of renal origin: Secondary | ICD-10-CM | POA: Diagnosis not present

## 2018-07-31 DIAGNOSIS — K810 Acute cholecystitis: Secondary | ICD-10-CM | POA: Diagnosis not present

## 2018-07-31 DIAGNOSIS — N189 Chronic kidney disease, unspecified: Secondary | ICD-10-CM | POA: Diagnosis not present

## 2018-07-31 DIAGNOSIS — Z20828 Contact with and (suspected) exposure to other viral communicable diseases: Secondary | ICD-10-CM | POA: Diagnosis not present

## 2018-07-31 DIAGNOSIS — E1122 Type 2 diabetes mellitus with diabetic chronic kidney disease: Secondary | ICD-10-CM | POA: Insufficient documentation

## 2018-07-31 DIAGNOSIS — I120 Hypertensive chronic kidney disease with stage 5 chronic kidney disease or end stage renal disease: Secondary | ICD-10-CM | POA: Insufficient documentation

## 2018-07-31 DIAGNOSIS — Z7982 Long term (current) use of aspirin: Secondary | ICD-10-CM | POA: Insufficient documentation

## 2018-07-31 DIAGNOSIS — E0822 Diabetes mellitus due to underlying condition with diabetic chronic kidney disease: Secondary | ICD-10-CM

## 2018-07-31 DIAGNOSIS — K4020 Bilateral inguinal hernia, without obstruction or gangrene, not specified as recurrent: Secondary | ICD-10-CM | POA: Diagnosis not present

## 2018-07-31 DIAGNOSIS — Z992 Dependence on renal dialysis: Secondary | ICD-10-CM | POA: Diagnosis not present

## 2018-07-31 DIAGNOSIS — E1129 Type 2 diabetes mellitus with other diabetic kidney complication: Secondary | ICD-10-CM | POA: Diagnosis present

## 2018-07-31 DIAGNOSIS — K8010 Calculus of gallbladder with chronic cholecystitis without obstruction: Secondary | ICD-10-CM | POA: Diagnosis not present

## 2018-07-31 DIAGNOSIS — Z8673 Personal history of transient ischemic attack (TIA), and cerebral infarction without residual deficits: Secondary | ICD-10-CM | POA: Diagnosis not present

## 2018-07-31 DIAGNOSIS — Z96653 Presence of artificial knee joint, bilateral: Secondary | ICD-10-CM | POA: Diagnosis not present

## 2018-07-31 DIAGNOSIS — R1010 Upper abdominal pain, unspecified: Secondary | ICD-10-CM

## 2018-07-31 DIAGNOSIS — N186 End stage renal disease: Secondary | ICD-10-CM | POA: Diagnosis not present

## 2018-07-31 DIAGNOSIS — Z419 Encounter for procedure for purposes other than remedying health state, unspecified: Secondary | ICD-10-CM

## 2018-07-31 DIAGNOSIS — M353 Polymyalgia rheumatica: Secondary | ICD-10-CM | POA: Diagnosis not present

## 2018-07-31 DIAGNOSIS — K819 Cholecystitis, unspecified: Secondary | ICD-10-CM

## 2018-07-31 DIAGNOSIS — K219 Gastro-esophageal reflux disease without esophagitis: Secondary | ICD-10-CM

## 2018-07-31 DIAGNOSIS — D631 Anemia in chronic kidney disease: Secondary | ICD-10-CM | POA: Diagnosis not present

## 2018-07-31 DIAGNOSIS — I10 Essential (primary) hypertension: Secondary | ICD-10-CM

## 2018-07-31 DIAGNOSIS — I252 Old myocardial infarction: Secondary | ICD-10-CM | POA: Diagnosis not present

## 2018-07-31 DIAGNOSIS — I2510 Atherosclerotic heart disease of native coronary artery without angina pectoris: Secondary | ICD-10-CM | POA: Insufficient documentation

## 2018-07-31 DIAGNOSIS — Z89612 Acquired absence of left leg above knee: Secondary | ICD-10-CM

## 2018-07-31 DIAGNOSIS — E785 Hyperlipidemia, unspecified: Secondary | ICD-10-CM | POA: Diagnosis present

## 2018-07-31 DIAGNOSIS — E1151 Type 2 diabetes mellitus with diabetic peripheral angiopathy without gangrene: Secondary | ICD-10-CM | POA: Insufficient documentation

## 2018-07-31 DIAGNOSIS — E089 Diabetes mellitus due to underlying condition without complications: Secondary | ICD-10-CM | POA: Diagnosis present

## 2018-07-31 DIAGNOSIS — Z7901 Long term (current) use of anticoagulants: Secondary | ICD-10-CM | POA: Diagnosis not present

## 2018-07-31 DIAGNOSIS — Z87891 Personal history of nicotine dependence: Secondary | ICD-10-CM | POA: Insufficient documentation

## 2018-07-31 DIAGNOSIS — Z89421 Acquired absence of other right toe(s): Secondary | ICD-10-CM | POA: Insufficient documentation

## 2018-07-31 DIAGNOSIS — Z9115 Patient's noncompliance with renal dialysis: Secondary | ICD-10-CM | POA: Diagnosis not present

## 2018-07-31 DIAGNOSIS — K8000 Calculus of gallbladder with acute cholecystitis without obstruction: Secondary | ICD-10-CM | POA: Diagnosis present

## 2018-07-31 DIAGNOSIS — K8050 Calculus of bile duct without cholangitis or cholecystitis without obstruction: Secondary | ICD-10-CM | POA: Insufficient documentation

## 2018-07-31 DIAGNOSIS — R9431 Abnormal electrocardiogram [ECG] [EKG]: Secondary | ICD-10-CM

## 2018-07-31 LAB — CBC WITH DIFFERENTIAL/PLATELET
Abs Immature Granulocytes: 0.01 10*3/uL (ref 0.00–0.07)
Basophils Absolute: 0 10*3/uL (ref 0.0–0.1)
Basophils Relative: 1 %
Eosinophils Absolute: 0.1 10*3/uL (ref 0.0–0.5)
Eosinophils Relative: 2 %
HCT: 33.3 % — ABNORMAL LOW (ref 39.0–52.0)
Hemoglobin: 10.5 g/dL — ABNORMAL LOW (ref 13.0–17.0)
Immature Granulocytes: 0 %
Lymphocytes Relative: 20 %
Lymphs Abs: 0.9 10*3/uL (ref 0.7–4.0)
MCH: 30.8 pg (ref 26.0–34.0)
MCHC: 31.5 g/dL (ref 30.0–36.0)
MCV: 97.7 fL (ref 80.0–100.0)
Monocytes Absolute: 0.4 10*3/uL (ref 0.1–1.0)
Monocytes Relative: 9 %
Neutro Abs: 3.2 10*3/uL (ref 1.7–7.7)
Neutrophils Relative %: 68 %
Platelets: 124 10*3/uL — ABNORMAL LOW (ref 150–400)
RBC: 3.41 MIL/uL — ABNORMAL LOW (ref 4.22–5.81)
RDW: 14.7 % (ref 11.5–15.5)
WBC: 4.7 10*3/uL (ref 4.0–10.5)
nRBC: 0 % (ref 0.0–0.2)

## 2018-07-31 LAB — COMPREHENSIVE METABOLIC PANEL
ALT: 11 U/L (ref 0–44)
AST: 16 U/L (ref 15–41)
Albumin: 3.7 g/dL (ref 3.5–5.0)
Alkaline Phosphatase: 88 U/L (ref 38–126)
Anion gap: 13 (ref 5–15)
BUN: 25 mg/dL — ABNORMAL HIGH (ref 6–20)
CO2: 26 mmol/L (ref 22–32)
Calcium: 9.5 mg/dL (ref 8.9–10.3)
Chloride: 101 mmol/L (ref 98–111)
Creatinine, Ser: 7.41 mg/dL — ABNORMAL HIGH (ref 0.61–1.24)
GFR calc Af Amer: 8 mL/min — ABNORMAL LOW (ref >60)
GFR calc non Af Amer: 7 mL/min — ABNORMAL LOW (ref >60)
Glucose, Bld: 132 mg/dL — ABNORMAL HIGH (ref 70–99)
Potassium: 4.6 mmol/L (ref 3.5–5.1)
Sodium: 140 mmol/L (ref 135–145)
Total Bilirubin: 1.1 mg/dL (ref 0.3–1.2)
Total Protein: 7.1 g/dL (ref 6.5–8.1)

## 2018-07-31 LAB — GLUCOSE, CAPILLARY: Glucose-Capillary: 95 mg/dL (ref 70–99)

## 2018-07-31 LAB — LIPASE, BLOOD: Lipase: 26 U/L (ref 11–51)

## 2018-07-31 LAB — SARS CORONAVIRUS 2 BY RT PCR (HOSPITAL ORDER, PERFORMED IN ~~LOC~~ HOSPITAL LAB): SARS Coronavirus 2: NEGATIVE

## 2018-07-31 LAB — LACTIC ACID, PLASMA: Lactic Acid, Venous: 1.1 mmol/L (ref 0.5–1.9)

## 2018-07-31 MED ORDER — FENTANYL CITRATE (PF) 100 MCG/2ML IJ SOLN
50.0000 ug | Freq: Once | INTRAMUSCULAR | Status: AC
  Administered 2018-07-31: 50 ug via INTRAVENOUS
  Filled 2018-07-31: qty 2

## 2018-07-31 MED ORDER — SODIUM CHLORIDE 0.9 % IV SOLN: 250.0000 mL | INTRAVENOUS | Status: DC | PRN

## 2018-07-31 MED ORDER — ACETAMINOPHEN 650 MG RE SUPP: 650.0000 mg | Freq: Four times a day (QID) | RECTAL | Status: DC | PRN

## 2018-07-31 MED ORDER — ACETAMINOPHEN 325 MG PO TABS
650.0000 mg | ORAL_TABLET | Freq: Four times a day (QID) | ORAL | Status: DC | PRN
  Administered 2018-08-01: 650 mg via ORAL
  Filled 2018-07-31: qty 2

## 2018-07-31 MED ORDER — PIPERACILLIN-TAZOBACTAM 3.375 G IVPB
3.3750 g | Freq: Two times a day (BID) | INTRAVENOUS | Status: DC
  Administered 2018-07-31 – 2018-08-01 (×3): 3.375 g via INTRAVENOUS
  Filled 2018-07-31 (×5): qty 50

## 2018-07-31 MED ORDER — ONDANSETRON HCL 4 MG/2ML IJ SOLN
4.0000 mg | Freq: Once | INTRAMUSCULAR | Status: AC
  Administered 2018-07-31: 4 mg via INTRAVENOUS
  Filled 2018-07-31: qty 2

## 2018-07-31 MED ORDER — SODIUM CHLORIDE 0.9% FLUSH: 3.0000 mL | INTRAVENOUS | Status: DC | PRN

## 2018-07-31 MED ORDER — AMLODIPINE BESYLATE 5 MG PO TABS
5.0000 mg | ORAL_TABLET | Freq: Every day | ORAL | Status: DC
  Administered 2018-08-01 – 2018-08-04 (×3): 5 mg via ORAL
  Filled 2018-07-31 (×3): qty 1

## 2018-07-31 MED ORDER — SODIUM CHLORIDE 0.9 % IV SOLN
2.0000 g | INTRAVENOUS | Status: DC
  Administered 2018-07-31: 2 g via INTRAVENOUS
  Filled 2018-07-31: qty 20

## 2018-07-31 MED ORDER — TEMAZEPAM 15 MG PO CAPS
30.0000 mg | ORAL_CAPSULE | Freq: Every evening | ORAL | Status: DC | PRN
  Administered 2018-07-31 – 2018-08-03 (×4): 30 mg via ORAL
  Filled 2018-07-31 (×4): qty 2

## 2018-07-31 MED ORDER — SODIUM CHLORIDE 0.9% FLUSH
3.0000 mL | Freq: Two times a day (BID) | INTRAVENOUS | Status: DC
  Administered 2018-07-31 – 2018-08-03 (×4): 3 mL via INTRAVENOUS

## 2018-07-31 MED ORDER — CARVEDILOL 12.5 MG PO TABS
12.5000 mg | ORAL_TABLET | Freq: Two times a day (BID) | ORAL | Status: DC
  Administered 2018-08-01 – 2018-08-03 (×6): 12.5 mg via ORAL
  Filled 2018-07-31 (×6): qty 1

## 2018-07-31 MED ORDER — INSULIN ASPART 100 UNIT/ML ~~LOC~~ SOLN
0.0000 [IU] | SUBCUTANEOUS | Status: DC
  Administered 2018-08-02 (×2): 2 [IU] via SUBCUTANEOUS
  Administered 2018-08-03: 1 [IU] via SUBCUTANEOUS

## 2018-07-31 NOTE — ED Notes (Signed)
Patient transported to Ultrasound 

## 2018-07-31 NOTE — ED Provider Notes (Signed)
[facility] EMERGENCY DEPARTMENT Provider Note   CSN: [id number] Arrival date & time: 07/31/18  1208    History   Chief Complaint Chief Complaint  Patient presents with   Abdominal Pain    HPI Matthew Kline is a 58 y.o. male.     HPI   Matthew Kline is a 58 y.o. male, with a history of anemia, anxiety, ESRD on dialysis, GERD, DM, presenting to the ED with abdominal pain beginning shortly prior to arrival.  Patient arrives via EMS from the dialysis center.  He completed about 15 minutes of his dialysis session. Patient indicates the pain is periumbilical, sharp, 27/25, radiating to the back.  Accompanied by nausea, vomiting, and diarrhea.  Last bowel movement was approximately 2 hours prior to arrival.  Patient does state he has had some swelling into the scrotum, but adds he has a known inguinal hernia and does not know if this is worsened. Patient was evaluated for abdominal complaints most recently on June 28 and then again on July 15.  Denies fever/chills, chest pain, shortness of breath, cough, hematochezia/melena, hematemesis, syncope, neuro deficits, or any other complaints.  Patient is on a Monday, Wednesday, Friday dialysis schedule with last dialysis Friday, July 17.   Past Medical History:  Diagnosis Date   Anemia, unspecified    Anxiety    Arthralgia 2010   polyarticular   Arthritis    "back, knees" (01/10/2017)   Cancer ([place])    "kidney area" (01/10/2017)   CHF (congestive heart failure) ([place]) 07/25/2009   denies   Chronic lower back pain    Coronary artery disease    Coughing    pt. 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   RUE/RLE, speech   Long term (current) use of anticoagulants    Myocardial infarction ([place]) 1995   Orthostatic hypotension    Osteomyelitis of foot, left, acute ([place])    Other chronic postoperative pain    Pneumonia    "probably twice" (01/10/2017)   Polymyalgia rheumatica ([place])    Renal insufficiency    Sleep apnea    "lost weight; no more problem" (01/10/2017)   Stroke ([place]) 01/10/06   denies residual on 05/09/2014   Type II diabetes mellitus ([place]) dx'd 1995   Unspecified hereditary and idiopathic peripheral neuropathy    feet   Unspecified osteomyelitis, site unspecified    Unspecified vitamin D deficiency     Patient Active Problem List   Diagnosis Date Noted   Acute respiratory failure with hypoxia ([place]) 07/10/2018   Abdominal pain 07/10/2018   Bilateral recurrent inguinal hernia without obstruction or gangrene    Altered mental status    Cerebral thrombosis with cerebral infarction 02/06/2017   Cerebral embolism with cerebral infarction 02/06/2017   Above knee amputation status, left    Pressure injury of skin 01/30/2017   Acute lower UTI 01/29/2017  Acute metabolic encephalopathy 62/13/0865   UTI (urinary tract infection) 01/29/2017   Quadriceps muscle rupture, left, initial encounter    Fall 01/09/2017   Gait disturbance 01/09/2017   Staphylococcus aureus infection 01/09/2017   Infection of prosthetic left knee joint ([place]) 01/09/2017   Fever, unknown origin 11/09/2016   Critical lower limb ischemia 08/25/2016   Ulcer of left midfoot with fat layer exposed ([place]) 08/13/2016   Diabetic ulcer of left midfoot associated with type 2 diabetes mellitus, with fat layer exposed ([place]) 07/25/2016   Peripheral  neuropathy 07/22/2016   Tobacco abuse 07/22/2016   CAD in native artery 05/18/2016   CAD, multiple vessel 05/11/2016   Positive cardiac stress test 05/11/2016   Abnormal stress test 04/30/2016   Pre-transplant evaluation for kidney transplant 04/30/2016   S/P revision of total knee 11/26/2015   Pain in the chest    Acute on chronic diastolic heart failure (HCC) 07/05/2015   Volume overload 07/04/2015   Shortness of breath 07/04/2015   Hypoxemia 07/04/2015   Elevated troponin    End-stage renal disease on hemodialysis (HCC)    Hypervolemia    Failed total knee arthroplasty, 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   Pyogenic bacterial arthritis of knee, left (HCC) 08/07/2014   Tachycardia 07/24/2014   Acute upper respiratory infection 07/24/2014   ESRD on dialysis ([place]) 07/14/2014   Type II diabetes mellitus ([place]) 07/14/2014   Anemia in chronic kidney disease 07/14/2014   Congestive heart failure (CHF) ([place]) 07/13/2014   Surgical wound dehiscence 05/09/2014   Dehiscence of closure of skin 05/09/2014   Total knee replacement status 04/10/2014   Diabetes mellitus with renal manifestations, controlled ([place]) 10/24/2013   Hypertensive renal disease 06/27/2013   DM type 2 causing vascular disease ([place]) 06/27/2013   Erectile dysfunction 06/27/2013   Depression 06/27/2013   Claudication of left lower extremity ([place]) 12/19/2012   Essential hypertension, benign 12/19/2012   Sinusitis, acute maxillary 11/22/2012   Otitis, externa, infective 11/14/2012   Leg edema, left 11/14/2012   End stage renal disease ([place]) 10/02/2012   Controlled type 2 DM with proteinuria or microalbuminuria 09/19/2012   GERD (gastroesophageal reflux disease) 09/19/2012   Leukocytosis 09/19/2012   Lacunar infarction ([place]) 08/17/2012   Polymyalgia rheumatica ([place]) 08/17/2012   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   Essential hypertension 05/10/2012   Vitamin D deficiency 05/10/2012    Diabetes mellitus due to underlying condition ([place]) 05/10/2012   Hyperlipidemia LDL goal <100 05/10/2012   Anemia of chronic disease 05/10/2012   Screening for prostate cancer 05/10/2012   Chronic kidney disease (CKD), stage IV (severe) ([place]) 05/10/2012   Peripheral autonomic neuropathy due to DM ([place]) 05/10/2012   Callus of foot 05/10/2012   Urgency of urination 05/10/2012   Hyperkalemia 05/10/2012   Candidiasis of the esophagus 10/12/2011   Internal hemorrhoids without mention of complication 78/46/9629   Pre-syncope 07/25/2009   DJD (degenerative joint disease) of cervical spine 02/17/2009    Past Surgical History:  Procedure Laterality Date   ABDOMINAL AORTOGRAM N/A 08/25/2016   Procedure: ABDOMINAL AORTOGRAM;  Surgeon: Wellington Hampshire, MD;  Location: [facility];  Service: Cardiovascular;  Laterality: N/A;   AMPUTATION  01/21/2012   Procedure: AMPUTATION RAY;  Surgeon: [name], MD;  Location: [facility];  Service: Orthopedics;  Laterality: Left;  Left Foot 4th Ray Amputation   AMPUTATION Left 05/04/2013   Procedure: AMPUTATION DIGIT;  Surgeon: [name], MD;  Location: [facility];  Service: Orthopedics;  Laterality: Left;  Left Great Toe  Amputation at [facility]   AMPUTATION Left 01/14/2017   Procedure: AMPUTATION ABOVE LEFT KNEE;  Surgeon: [name], MD;  Location: [facility];  Service: Orthopedics;  Laterality: Left;   ANTERIOR CERVICAL DECOMP/DISCECTOMY FUSION  02/2011   BACK SURGERY     BASCILIC VEIN TRANSPOSITION Left 10/19/2012   Procedure: BASCILIC VEIN TRANSPOSITION;  Surgeon: Serafina Mitchell, MD;  Location: [facility];  Service: Vascular;  Laterality: Left;   CARDIAC CATHETERIZATION     "before bypass"   CORONARY ARTERY BYPASS GRAFT     x 5 with lima at [place] Left 08/07/2014   Procedure: Replace Left Total Knee Arthroplasty,  Place Antibiotic Spacer;  Surgeon: [name], MD;  Location:  [facility];  Service: Orthopedics;  Laterality: Left;   I&D EXTREMITY Left 05/09/2014   Procedure: Irrigation and Debridement Left Knee and Closure of Total Knee Arthroplasty Incision;  Surgeon: [name], MD;  Location: [facility];  Service: Orthopedics;  Laterality: Left;   I&D KNEE WITH POLY EXCHANGE Left 05/31/2014   Procedure: IRRIGATION AND DEBRIDEMENT LEFT KNEE, PLACE ANTIBIOTIC BEADS,  POLY EXCHANGE;  Surgeon: [name], MD;  Location: [facility];  Service: Orthopedics;  Laterality: Left;   IRRIGATION AND DEBRIDEMENT KNEE Left 01/12/2017   Procedure: IRRIGATION AND DEBRIDEMENT LEFT KNEE;  Surgeon: [name], MD;  Location: [facility];  Service: Orthopedics;  Laterality: Left;   JOINT REPLACEMENT     KNEE ARTHROSCOPY Left 08-25-2012   LOWER EXTREMITY ANGIOGRAPHY Left 08/25/2016   Procedure: Lower Extremity Angiography;  Surgeon: Wellington Hampshire, MD;  Location: [facility];  Service: Cardiovascular;  Laterality: Left;   PERIPHERAL VASCULAR BALLOON ANGIOPLASTY Left 08/25/2016   Procedure: PERIPHERAL VASCULAR BALLOON ANGIOPLASTY;  Surgeon: Wellington Hampshire, MD;  Location: [facility];  Service: Cardiovascular;  Laterality: Left;  lt peroneal and ant tibial arteries cutting balloon   REFRACTIVE SURGERY Bilateral    TOE AMPUTATION Bilateral    "I've lost 7 toes over the last 7 years" (05/09/2014)   TOE SURGERY Left April 2015   Big toe removed on left foot.   TONSILLECTOMY     TOTAL KNEE ARTHROPLASTY Left 04/10/2014   Procedure: TOTAL KNEE ARTHROPLASTY;  Surgeon: [name], MD;  Location: [facility];  Service: Orthopedics;  Laterality: Left;   TOTAL KNEE REVISION Left 10/25/2014   Procedure: LEFT TOTAL KNEE REVISION;  Surgeon: [name], MD;  Location: [facility];  Service: Orthopedics;  Laterality: Left;   TOTAL KNEE REVISION Left 11/26/2015   Procedure: Removal Left Total Knee Arthroplasty, Hinged Total Knee Arthroplasty;  Surgeon: [name], MD;  Location: [facility];   Service: Orthopedics;  Laterality: Left;   UVULOPALATOPHARYNGOPLASTY, TONSILLECTOMY AND SEPTOPLASTY  ~ [place] Left 05/09/2014   Dehiscence Left Total Knee Arthroplasty Incision        Home Medications    Prior to Admission medications   Medication Sig Start Date End Date Taking? Authorizing Provider  amLODipine (NORVASC) 5 MG tablet Take 5 mg by mouth daily.    Yes [provider]  aspirin EC 81 MG EC tablet Take 2 tablets (162 mg total) by mouth daily. Patient taking differently: Take 81 mg by mouth daily.  01/20/17  Yes Patrecia Pour, Christean Grief, MD  carvedilol (COREG) 12.5 MG tablet Take 1 tablet (12.5 mg total) by mouth 2 (two) times daily with a meal. 02/08/17  Yes Ogbata,  Babs Bertin, MD  Darbepoetin Alfa (ARANESP) 150 MCG/0.3ML SOSY injection Inject 0.3 mLs (150 mcg total) into the vein every Friday with hemodialysis. 02/11/17  Yes Bonnell Public, MD  dicyclomine (BENTYL) 20 MG tablet Take 1 tablet (20 mg total) by mouth every 6 (six) hours as needed for spasms (abdominal pain). 07/26/18  Yes Virgel Manifold, MD  hydrOXYzine (ATARAX/VISTARIL) 50 MG tablet Take 50 mg by mouth 3 (three) times daily as needed for itching.    Yes [provider]  multivitamin (RENA-VIT) TABS tablet Take 1 tablet by mouth at bedtime. 02/08/17  Yes Dana Allan I, MD  sevelamer carbonate (RENVELA) 800 MG tablet Take 32,000 mg by mouth 3 (three) times daily with meals.    Yes [provider]  temazepam (RESTORIL) 30 MG capsule Take 30 mg by mouth at bedtime as needed for sleep.   Yes [provider]  Amino Acids-Protein Hydrolys (FEEDING SUPPLEMENT, PRO-STAT SUGAR FREE 64,) LIQD Take 30 mLs by mouth 2 (two) times daily. Patient not taking: Reported on 11/30/2017 01/19/17   Patrecia Pour, Christean Grief, MD  ferric citrate (AURYXIA) 1 GM 210 MG(Fe) tablet Take 2 tablets (420 mg total) by mouth 3 (three) times daily with meals. Patient not taking: Reported on 11/30/2017  02/08/17   Dana Allan I, MD  gabapentin (NEURONTIN) 300 MG capsule TAKE 1 CAPSULE BY MOUTH THREE TIMES A DAY Patient not taking: No sig reported 10/24/17   [name], MD  montelukast (SINGULAIR) 10 MG tablet TAKE 1 TABLET (10 MG TOTAL) BY MOUTH AT BEDTIME. Patient not taking: No sig reported 09/01/15   Gildardo Cranker, DO  Nutritional Supplements (FEEDING SUPPLEMENT, NEPRO CARB STEADY,) LIQD Take 237 mLs by mouth 2 (two) times daily between meals. Patient not taking: Reported on 11/30/2017 01/19/17   Patrecia Pour, Christean Grief, MD  ondansetron (ZOFRAN ODT) 4 MG disintegrating tablet Take 1 tablet (4 mg total) by mouth every 8 (eight) hours as needed for nausea or vomiting. Patient not taking: Reported on 07/10/2018 12/16/17   Kinnie Feil, PA-C  pentoxifylline (TRENTAL) 400 MG CR tablet TAKE 1 TABLET (400 MG TOTAL) BY MOUTH 3 (THREE) TIMES DAILY WITH MEALS. Patient not taking: Reported on 07/10/2018 02/22/18   [name], MD  traMADol (ULTRAM) 50 MG tablet Take 1 tablet (50 mg total) by mouth every 6 (six) hours as needed. Patient not taking: Reported on 07/10/2018 12/16/17   Kinnie Feil, PA-C    Family History Family History  Problem Relation Age of Onset   Hypertension Mother    Cancer Mother 24       Ovarian   Heart disease Maternal Aunt    Stroke Maternal Grandfather     Social History Social History   Tobacco Use   Smoking status: Former Smoker    Packs/day: 0.12    Years: 32.00    Pack years: 3.84    Types: Cigarettes    Quit date: 10/05/2016    Years since quitting: 1.8   Smokeless tobacco: Never Used  Substance Use Topics   Alcohol use: No    Alcohol/week: 0.0 standard drinks   Drug use: No     Allergies   Morphine and related, Tygacil [tigecycline], and Imodium [loperamide]   Review of Systems Review of Systems  Constitutional: Negative for chills, diaphoresis and fever.  Respiratory: Negative for cough and shortness of breath.     Cardiovascular: Negative for chest pain.  Gastrointestinal: Positive for abdominal pain, diarrhea, nausea and vomiting. Negative  for blood in stool.  Neurological: Negative for syncope and weakness.  All other systems reviewed and are negative.    Physical Exam Updated Vital Signs BP (!) 157/99    Pulse 88    Resp 20    SpO2 100%   Blood pressure 133/84, pulse 71, temperature (!) 96.7 F (35.9 C), temperature source Oral, resp. rate 19, SpO2 100 %.  Physical Exam Vitals signs and nursing note reviewed.  Constitutional:      General: He is in acute distress (pain).     Appearance: He is well-developed. He is not diaphoretic.  HENT:     Head: Normocephalic and atraumatic.     Mouth/Throat:     Mouth: Mucous membranes are moist.     Pharynx: Oropharynx is clear.  Eyes:     Conjunctiva/sclera: Conjunctivae normal.  Neck:     Musculoskeletal: Neck supple.  Cardiovascular:     Rate and Rhythm: Normal rate and regular rhythm.     Pulses: Normal pulses.          Radial pulses are 2+ on the right side and 2+ on the left side.       Posterior tibial pulses are 2+ on the right side.     Heart sounds: Normal heart sounds.     Comments: Tactile temperature in the extremities appropriate and equal bilaterally. Pulmonary:     Effort: Pulmonary effort is normal. No respiratory distress.     Breath sounds: Normal breath sounds.  Abdominal:     Palpations: Abdomen is soft.     Tenderness: There is abdominal tenderness in the epigastric area and periumbilical area. There is no right CVA tenderness, left CVA tenderness or guarding.  Genitourinary:    Comments: Swelling into the scrotum with large hernia noted, seems to be coming from the right inguinal region.  Area is sensitive, but not tender. Musculoskeletal:     Right lower leg: No edema.     Left lower leg: No edema.  Feet:     Left foot:     Amputation: Left leg is amputated above knee.  Lymphadenopathy:     Cervical: No  cervical adenopathy.  Skin:    General: Skin is warm and dry.  Neurological:     Mental Status: He is alert.  Psychiatric:        Mood and Affect: Mood and affect normal.        Speech: Speech normal.        Behavior: Behavior normal.      ED Treatments / Results  Labs (all labs ordered are listed, but only abnormal results are displayed) Labs Reviewed  COMPREHENSIVE METABOLIC PANEL - Abnormal; Notable for the following components:      Result Value   Glucose, Bld 132 (*)    BUN 25 (*)    Creatinine, Ser 7.41 (*)    GFR calc non Af Amer 7 (*)    GFR calc Af Amer 8 (*)    All other components within normal limits  CBC WITH DIFFERENTIAL/PLATELET - Abnormal; Notable for the following components:   RBC 3.41 (*)    Hemoglobin 10.5 (*)    HCT 33.3 (*)    Platelets 124 (*)    All other components within normal limits  LIPASE, BLOOD  LACTIC ACID, PLASMA  LACTIC ACID, PLASMA    EKG EKG Interpretation  Date/Time:  Monday July 31 2018 12:14:50 EDT Ventricular Rate:  65 PR Interval:    QRS Duration: 109  QT Interval:  498 QTC Calculation: 518 R Axis:   70 Text Interpretation:  Sinus rhythm Prolonged PR interval Probable left atrial enlargement Nonspecific T abnormalities, lateral leads Prolonged QT interval No significant change since last tracing Confirmed by Blanchie Dessert [phone]) on 07/31/2018 4:08:48 PM   Radiology No results found.  Ct Abdomen Pelvis Wo Contrast  Result Date: 07/09/2018 CLINICAL DATA:  Abdominal pain and vomiting 2 days. Bowel obstruction. Inguinal hernia. EXAM: CT ABDOMEN AND PELVIS WITHOUT CONTRAST TECHNIQUE: Multidetector CT imaging of the abdomen and pelvis was performed following the standard protocol without IV contrast. COMPARISON:  01/02/2018 FINDINGS: Lower chest: New small bilateral pleural effusions and bibasilar atelectasis. Hepatobiliary: No mass visualized on this unenhanced exam. Tiny calcified gallstones are again seen, without definite  evidence of cholecystitis. Pancreas: No mass or inflammatory process visualized on this unenhanced exam. Spleen:  Within normal limits in size. Adrenals/Urinary tract: Stable bilateral diffuse renal parenchymal atrophy. No evidence of renal masses or hydronephrosis. Diffuse renal vascular calcification noted. Stomach/Bowel: No evidence of obstruction or focal inflammatory process. A moderate size right inguinal hernia is seen containing several small bowel loops, however there is no evidence of obstruction or strangulation. A small left inguinal hernia is seen containing only fat. Vascular/Lymphatic: No pathologically enlarged lymph nodes identified. No evidence of abdominal aortic aneurysm. Aortic atherosclerosis. Reproductive:  No mass or other significant abnormality. Other: Mild ascites is new since previous study. Diffuse mesenteric and body wall edema is also new since prior exam. Musculoskeletal:  No suspicious bone lesions identified. IMPRESSION: 1. New mild ascites, diffuse mesenteric and body wall edema, and small bilateral pleural effusions, consistent with 3rd spacing. No focal inflammatory process or mass identified. 2. Increased sizes of right inguinal hernia containing several small bowel loops, and left inguinal hernia containing only fat. No evidence of bowel obstruction or strangulation. 3. Cholelithiasis. No radiographic evidence of cholecystitis. Electronically Signed   By: Marlaine Hind M.D.   On: 07/09/2018 21:13   Nm Hepato W/eject Fract  Result Date: 07/10/2018 CLINICAL DATA:  Cholelithiasis EXAM: NUCLEAR MEDICINE HEPATOBILIARY IMAGING WITH GALLBLADDER EF TECHNIQUE: Sequential images of the abdomen were obtained out to 60 minutes following intravenous administration of radiopharmaceutical. After oral ingestion of Ensure, gallbladder ejection fraction was determined. At 60 min, normal ejection fraction is greater than 33%. RADIOPHARMACEUTICALS:  Five mCi Tc-45m  Choletec IV COMPARISON:   Abdominal CT from yesterday FINDINGS: Prompt uptake and biliary excretion of activity by the liver is seen. Gallbladder activity is visualized, consistent with patency of cystic duct. Biliary activity passes into small bowel, consistent with patent common bile duct. Calculated gallbladder ejection fraction is 27%. (Normal gallbladder ejection fraction with Ensure is greater than 33%.) IMPRESSION: Patent cystic and common bile ducts. Low gallbladder ejection fraction of 27%. Electronically Signed   By: Monte Fantasia M.D.   On: 07/10/2018 10:36   Dg Chest Portable 1 View  Result Date: 07/09/2018 CLINICAL DATA:  Pleural effusions, history CHF, coronary artery disease post MI, diabetes mellitus, end-stage renal disease on dialysis, hypertension EXAM: PORTABLE CHEST 1 VIEW COMPARISON:  Portable exam 2047 hours compared to 02/15/2018 FINDINGS: Lordotic positioning. Enlargement of cardiac silhouette with pulmonary vascular congestion post median sternotomy. LEFT basilar atelectasis. Lungs otherwise clear. No definite infiltrate, pleural effusion or pneumothorax. Bones demineralized. IMPRESSION: Enlargement of cardiac silhouette with pulmonary vascular congestion. LEFT basilar atelectasis. Electronically Signed   By: Lavonia Dana M.D.   On: 07/09/2018 23:01   Ct Angio Abd/pel W And/or Wo Contrast  Result Date: 07/09/2018 CLINICAL DATA:  Abdominal pain.  Assess for mesenteric ischemia EXAM: CTA ABDOMEN AND PELVIS wITHOUT AND WITH CONTRAST TECHNIQUE: Multidetector CT imaging of the abdomen and pelvis was performed using the standard protocol during bolus administration of intravenous contrast. Multiplanar reconstructed images and MIPs were obtained and reviewed to evaluate the vascular anatomy. CONTRAST:  153mL OMNIPAQUE IOHEXOL 350 MG/ML SOLN COMPARISON:  Noncontrast study performed earlier today. FINDINGS: VASCULAR Aorta: Heavily calcified.  No aneurysm or dissection. Celiac: Calcified, widely patent SMA:  Calcified, widely patent Renals: Heavily calcified bilaterally. Probable hemodynamically significant stenosis within the proximal left renal artery, greater than 50%. IMA: Calcified with mild stenosis at the origin, likely approximately 50%. Inflow: Heavily calcified.  No aneurysm. Proximal Outflow: Heavily calcified. Vessels are poorly opacified which appears to be due to slow flow rather than focal stenosis. Veins: Grossly unremarkable. Review of the MIP images confirms the above findings. NON-VASCULAR Lower chest: Small to moderate bilateral pleural effusions. There is cardiomegaly. Bilateral lower lobe airspace opacities. Hepatobiliary: Small gallstones within the gallbladder. No focal hepatic abnormality. Pancreas: No focal abnormality or ductal dilatation. Spleen: No focal abnormality.  Normal size. Adrenals/Urinary Tract: Atrophic kidneys compatible with end-stage renal disease. No adrenal mass. Urinary bladder is decompressed, grossly unremarkable. Stomach/Bowel: Stomach, large and small bowel grossly unremarkable. Lymphatic: No adenopathy Reproductive: Moderate free fluid in the cul-de-sac and adjacent to the liver and spleen. Other: Bilateral inguinal hernias. The right inguinal hernia contains small bowel loops. No evidence of obstruction. Left inguinal hernia contains fat. Musculoskeletal: No acute bony abnormality. IMPRESSION: VASCULAR Heavily calcified aorta and branch vessels. Mild stenosis at the origin of the IMA. Celiac and SMA appear widely patent. Heavily calcified renal arteries bilaterally likely with hemodynamically significant stenosis within the proximal left renal artery. NON-VASCULAR Cholelithiasis. Moderate ascites. Bilateral inguinal hernias, containing small bowel on the right and fat on the left. No evidence of bowel obstruction. Electronically Signed   By: Rolm Baptise M.D.   On: 07/09/2018 23:42    Procedures Procedures (including critical care time)  Medications Ordered in  ED Medications  fentaNYL (SUBLIMAZE) injection 50 mcg (has no administration in time range)  fentaNYL (SUBLIMAZE) injection 50 mcg (50 mcg Intravenous Given 07/31/18 1239)  ondansetron (ZOFRAN) injection 4 mg (4 mg Intravenous Given 07/31/18 1239)     Initial Impression / Assessment and Plan / ED Course  I have reviewed the triage vital signs and the nursing notes.  Pertinent labs & imaging results that were available during my care of the patient were reviewed by me and considered in my medical decision making (see chart for details).  Clinical Course as of Jul 31 1631  Mon Jul 31, 2018  1430 Patient voices significant improvement in his abdominal pain.   [SJ]  [id number] Spoke with Dr. Jonnie Finner, nephrologist. States patient's labs look good. They will work on scheduling the patient for dialysis likely tomorrow morning.  They will plan on calling back to confirm.   [SJ]  8 Dr. Jonnie Finner called back. He states he has not yet been able to find a spot for the patient for dialysis for tomorrow morning. He recommends the patient call his dialysis unit first thing tomorrow morning to see if they can fit him in.  Otherwise, patient may have to wait until Wednesday.   [SJ]    Clinical Course User Index [SJ] Joda Braatz C, PA-C       Patient presents with a complaint of abdominal pain. Patient is nontoxic appearing, afebrile,  not tachycardic, not tachypneic, not hypotensive, maintains excellent SPO2 on room air.  Patient has significant improvement in his pain with a single dose of fentanyl. No lactic acidosis.  Low suspicion for ischemia.  No leukocytosis. Patient has had similar pain before and has received extensive work-up without acute abnormalities noted. Since he has been having pain in the mid and upper abdomen and has a history of gallstones, we will obtain RUQ ultrasound.  Findings and plan of care discussed with Julianne Rice, MD. Dr. Lita Mains personally evaluated and examined this  patient.  End of shift patient care handoff report given to Alroy Bailiff, EM resident. Plan: RUQ [place] pending. Patient can likely be discharged, unless otherwise indicated.  He will need to call his dialysis unit in the morning to see if they have open slots.  Vitals:   07/31/18 1345 07/31/18 1400 07/31/18 1430 07/31/18 1500  BP: (!) 130/58 (!) 161/97 (!) 149/95 (!) 143/85  Pulse: 66 70 70 72  Resp: 14 11 18 14   SpO2: 95% 93% 97% 96%     Final Clinical Impressions(s) / ED Diagnoses   Final diagnoses:  Upper abdominal pain    ED Discharge Orders    None       Layla Maw 07/31/18 1800    Julianne Rice, MD 08/01/18 [phone]

## 2018-07-31 NOTE — ED Notes (Signed)
ED TO INPATIENT HANDOFF REPORT  ED Nurse Name and Phone #: [phone]   S Name/Age/Gender Matthew Kline 58 y.o. male Room/Bed: 040C/040C  Code Status   Code Status: Prior  Home/SNF/Other Home Patient oriented to: self, place, time and situation Is this baseline? Yes   Triage Complete: Triage complete  Chief Complaint Abd Pain; Dialysis Pt  Triage Note Pt here from Dialysis with c/io abd pain that radiates to the back , no n/v , pt was here a few weeks ago with same complaint , pt only did 15 mins of dialysis    Allergies Allergies  Allergen Reactions  . Morphine And Related Other (See Comments)    hallucinations  . Tygacil [Tigecycline] Nausea And Vomiting and Other (See Comments)    Makes him feel crazy  . Imodium [Loperamide] Itching    Level of Care/Admitting Diagnosis ED Disposition    ED Disposition Condition [facility]: [place] [[id number]]  Level of Care: Telemetry Medical [104]  Covid Evaluation: Asymptomatic Screening Protocol (No Symptoms)  Diagnosis: Biliary colic [497026]  Admitting Physician: Toy Baker [[id number]]  Attending Physician: Toy Baker [[id number]]  Estimated length of stay: 3 - 4 days  Certification:: I certify this patient will need inpatient services for at least 2 midnights  PT Class (Do Not Modify): Inpatient [101]  PT Acc Code (Do Not Modify): Private [1]       B Medical/Surgery History Past Medical History:  Diagnosis Date  . Anemia, unspecified   . Anxiety   . Arthralgia 2010   polyarticular  . Arthritis    "back, knees" (01/10/2017)  . Cancer [facility])    "kidney area" (01/10/2017)  . CHF (congestive heart failure) ([place]) 07/25/2009   denies  . Chronic lower back pain   . Coronary artery disease   . Coughing    pt. reports that he has drainage from sinus infection  . Diabetic foot ulcer ([place])   . Diabetic neuropathy ([place])   . ESRD (end stage renal disease) on dialysis  [facility])    started 12/2012; "MWF; [facility] "  (01/10/2017)  . GERD (gastroesophageal reflux disease)    hx "before I lost weight", no problem 9 years  . Hemodialysis access site with mature fistula ([place])   . Hemorrhoids, internal 10/2011   small  . High cholesterol   . History of blood transfusion    "related to the anemia"  . Hypertension   . Insomnia, unspecified   . 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 Long term (current) use of anticoagulants   . Myocardial infarction ([place]) 1995  . Orthostatic hypotension   . Osteomyelitis of foot, left, acute ([place])   . Other chronic postoperative pain   . Pneumonia    "probably twice" (01/10/2017)  . Polymyalgia rheumatica ([place])   . Renal insufficiency   . Sleep apnea    "lost weight; no more problem" (01/10/2017)  . Stroke ([place]) 01/10/06   denies residual on 05/09/2014  . Type II diabetes mellitus ([place]) dx'd 1995  . Unspecified hereditary and idiopathic peripheral neuropathy    feet  . Unspecified osteomyelitis, site unspecified   . Unspecified vitamin D deficiency    Past Surgical History:  Procedure Laterality Date  . ABDOMINAL AORTOGRAM N/A 08/25/2016   Procedure: ABDOMINAL AORTOGRAM;  Surgeon: Wellington Hampshire, MD;  Location: [facility];  Service: Cardiovascular;  Laterality: N/A;  . AMPUTATION  01/21/2012   Procedure:  AMPUTATION RAY;  Surgeon: [name], MD;  Location: [facility];  Service: Orthopedics;  Laterality: Left;  Left Foot 4th Ray Amputation  . AMPUTATION Left 05/04/2013   Procedure: AMPUTATION DIGIT;  Surgeon: [name], MD;  Location: [facility];  Service: Orthopedics;  Laterality: Left;  Left Great Toe Amputation at [facility]  . AMPUTATION Left 01/14/2017   Procedure: AMPUTATION ABOVE LEFT KNEE;  Surgeon: [name], MD;  Location: [facility];  Service: Orthopedics;  Laterality: Left;  . ANTERIOR CERVICAL DECOMP/DISCECTOMY FUSION  02/2011  . BACK SURGERY    . BASCILIC VEIN TRANSPOSITION Left 10/19/2012    Procedure: BASCILIC VEIN TRANSPOSITION;  Surgeon: Serafina Mitchell, MD;  Location: [facility];  Service: Vascular;  Laterality: Left;  . CARDIAC CATHETERIZATION     "before bypass"  . CORONARY ARTERY BYPASS GRAFT     x 5 with lima at [place] WITH ANTIBIOTIC SPACERS Left 08/07/2014   Procedure: Replace Left Total Knee Arthroplasty,  Place Antibiotic Spacer;  Surgeon: [name], MD;  Location: [facility];  Service: Orthopedics;  Laterality: Left;  . I&D EXTREMITY Left 05/09/2014   Procedure: Irrigation and Debridement Left Knee and Closure of Total Knee Arthroplasty Incision;  Surgeon: [name], MD;  Location: [facility];  Service: Orthopedics;  Laterality: Left;  . I&D KNEE WITH POLY EXCHANGE Left 05/31/2014   Procedure: IRRIGATION AND DEBRIDEMENT LEFT KNEE, PLACE ANTIBIOTIC BEADS,  POLY EXCHANGE;  Surgeon: [name], MD;  Location: [facility];  Service: Orthopedics;  Laterality: Left;  . IRRIGATION AND DEBRIDEMENT KNEE Left 01/12/2017   Procedure: IRRIGATION AND DEBRIDEMENT LEFT KNEE;  Surgeon: [name], MD;  Location: [facility];  Service: Orthopedics;  Laterality: Left;  . JOINT REPLACEMENT    . KNEE ARTHROSCOPY Left 08-25-2012  . LOWER EXTREMITY ANGIOGRAPHY Left 08/25/2016   Procedure: Lower Extremity Angiography;  Surgeon: Wellington Hampshire, MD;  Location: [facility];  Service: Cardiovascular;  Laterality: Left;  . PERIPHERAL VASCULAR BALLOON ANGIOPLASTY Left 08/25/2016   Procedure: PERIPHERAL VASCULAR BALLOON ANGIOPLASTY;  Surgeon: Wellington Hampshire, MD;  Location: [facility];  Service: Cardiovascular;  Laterality: Left;  lt peroneal and ant tibial arteries cutting balloon  . REFRACTIVE SURGERY Bilateral   . TOE AMPUTATION Bilateral    "I've lost 7 toes over the last 7 years" (05/09/2014)  . TOE SURGERY Left April 2015   Big toe removed on left foot.  . TONSILLECTOMY    . TOTAL KNEE ARTHROPLASTY Left 04/10/2014   Procedure: TOTAL KNEE  ARTHROPLASTY;  Surgeon: [name], MD;  Location: [facility];  Service: Orthopedics;  Laterality: Left;  . TOTAL KNEE REVISION Left 10/25/2014   Procedure: LEFT TOTAL KNEE REVISION;  Surgeon: [name], MD;  Location: [facility];  Service: Orthopedics;  Laterality: Left;  . TOTAL KNEE REVISION Left 11/26/2015   Procedure: Removal Left Total Knee Arthroplasty, Hinged Total Knee Arthroplasty;  Surgeon: [name], MD;  Location: [facility];  Service: Orthopedics;  Laterality: Left;  . UVULOPALATOPHARYNGOPLASTY, TONSILLECTOMY AND SEPTOPLASTY  ~ 1989  . WOUND DEBRIDEMENT Left 05/09/2014   Dehiscence Left Total Knee Arthroplasty Incision     A IV Location/Drains/Wounds Patient Lines/Drains/Airways Status   Active Line/Drains/Airways    Name:   Placement date:   Placement time:   Site:   Days:   Peripheral IV 07/31/18 Right Antecubital   07/31/18    1225    Antecubital  less than 1   Fistula / Graft Left Upper arm Arteriovenous fistula   -    -    Upper arm      Fistula / Graft Left Upper arm   -    -    Upper arm      Incision (Closed) 01/14/17 Leg Left   01/14/17    1253     563   Pressure Injury 01/29/17 Stage I -  Intact skin with non-blanchable redness of a localized area usually over a bony prominence.   01/29/17    2235     548          Intake/Output Last 24 hours No intake or output data in the 24 hours ending 07/31/18 2213  Labs/Imaging Results for orders placed or performed during the hospital encounter of 07/31/18 (from the past 48 hour(s))  Comprehensive metabolic panel     Status: Abnormal   Collection Time: 07/31/18 12:21 PM  Result Value Ref Range   Sodium 140 135 - 145 mmol/L   Potassium 4.6 3.5 - 5.1 mmol/L   Chloride 101 98 - 111 mmol/L   CO2 26 22 - 32 mmol/L   Glucose, Bld 132 (H) 70 - 99 mg/dL   BUN 25 (H) 6 - 20 mg/dL   Creatinine, Ser 7.41 (H) 0.61 - 1.24 mg/dL   Calcium 9.5 8.9 - 10.3 mg/dL   Total Protein 7.1 6.5 - 8.1 g/dL   Albumin 3.7 3.5 - 5.0 g/dL   AST  16 15 - 41 U/L   ALT 11 0 - 44 U/L   Alkaline Phosphatase 88 38 - 126 U/L   Total Bilirubin 1.1 0.3 - 1.2 mg/dL   GFR calc non Af Amer 7 (L) >60 mL/min   GFR calc Af Amer 8 (L) >60 mL/min   Anion gap 13 5 - 15    Comment: Performed at [facility], [address] [place], [place] [address]  CBC with Differential     Status: Abnormal   Collection Time: 07/31/18 12:21 PM  Result Value Ref Range   WBC 4.7 4.0 - 10.5 K/uL   RBC 3.41 (L) 4.22 - 5.81 MIL/uL   Hemoglobin 10.5 (L) 13.0 - 17.0 g/dL   HCT 33.3 (L) 39.0 - 52.0 %   MCV 97.7 80.0 - 100.0 fL   MCH 30.8 26.0 - 34.0 pg   MCHC 31.5 30.0 - 36.0 g/dL   RDW 14.7 11.5 - 15.5 %   Platelets 124 (L) 150 - 400 K/uL   nRBC 0.0 0.0 - 0.2 %   Neutrophils Relative % 68 %   Neutro Abs 3.2 1.7 - 7.7 K/uL   Lymphocytes Relative 20 %   Lymphs Abs 0.9 0.7 - 4.0 K/uL   Monocytes Relative 9 %   Monocytes Absolute 0.4 0.1 - 1.0 K/uL   Eosinophils Relative 2 %   Eosinophils Absolute 0.1 0.0 - 0.5 K/uL   Basophils Relative 1 %   Basophils Absolute 0.0 0.0 - 0.1 K/uL   Immature Granulocytes 0 %   Abs Immature Granulocytes 0.01 0.00 - 0.07 K/uL    Comment: Performed at [facility] [address], [place], [place] [address]  Lipase, blood     Status: None   Collection Time: 07/31/18 12:21 PM  Result Value Ref Range   Lipase 26 11 - 51 U/L    Comment: Performed at [facility] [address], [place], [place] [address]  Lactic acid, plasma     Status: None   Collection Time: 07/31/18 12:45 PM  Result Value Ref Range   Lactic Acid, Venous 1.1 0.5 - 1.9 mmol/L    Comment: Performed at [facility] [address], [place], [place] [address]  SARS Coronavirus 2 (CEPHEID - Performed in [place] hospital lab), Hosp Order     Status: None   Collection Time: 07/31/18  8:41 PM   Specimen: Nasopharyngeal Swab  Result Value Ref Range   SARS Coronavirus 2 NEGATIVE NEGATIVE    Comment: (NOTE) If result is  NEGATIVE SARS-CoV-2 target nucleic acids are NOT DETECTED. The SARS-CoV-2 RNA is generally detectable in upper and lower  respiratory specimens during the acute phase of infection. The lowest  concentration of SARS-CoV-2 viral copies this assay can detect is 250  copies / mL. A negative result does not preclude SARS-CoV-2 infection  and should not be used as the sole basis for treatment or other  patient management decisions.  A negative result may occur with  improper specimen collection / handling, submission of specimen other  than nasopharyngeal swab, presence of viral mutation(s) within the  areas targeted by this assay, and inadequate number of viral copies  (<250 copies / mL). A negative result must be combined with clinical  observations, patient history, and epidemiological information. If result is POSITIVE SARS-CoV-2 target nucleic acids are DETECTED. The SARS-CoV-2 RNA is generally detectable in upper and lower  respiratory specimens dur ing the acute phase of infection.  Positive  results are indicative of active infection with SARS-CoV-2.  Clinical  correlation with patient history and other diagnostic information is  necessary to determine patient infection status.  Positive results do  not rule out bacterial infection or co-infection with other viruses. If result is PRESUMPTIVE POSTIVE SARS-CoV-2 nucleic acids MAY BE PRESENT.   A presumptive positive result was obtained on the submitted specimen  and confirmed on repeat testing.  While 2019 novel coronavirus  (SARS-CoV-2) nucleic acids may be present in the submitted sample  additional confirmatory testing may be necessary for epidemiological  and / or clinical management purposes  to differentiate between  SARS-CoV-2 and other Sarbecovirus currently known to infect humans.  If clinically indicated additional testing with an alternate test  methodology [phone]) is advised. The SARS-CoV-2 RNA is generally  detectable  in upper and lower respiratory sp ecimens during the acute  phase of infection. The expected result is Negative. Fact Sheet for Patients:  StrictlyIdeas.no Fact Sheet for Healthcare Providers: BankingDealers.co.za This test is not yet approved or cleared by the [place] FDA and has been authorized for detection and/or diagnosis of SARS-CoV-2 by FDA under an Emergency Use Authorization (EUA).  This EUA will remain in effect (meaning this test can be used) for the duration of the COVID-19 declaration under Section 564(b)(1) of the Act, 21 U.S.C. section 360bbb-3(b)(1), unless the authorization is terminated or revoked sooner. Performed at [facility], [place] [address], [place], [place] [address]    US Abdomen Limited Ruq  Result Date: 07/31/2018 CLINICAL DATA:  Upper abdominal pain several months worse today. EXAM: ULTRASOUND ABDOMEN LIMITED RIGHT UPPER QUADRANT COMPARISON:  HIDA scan 07/10/2018 and CT 07/09/2018 FINDINGS: Gallbladder: Moderate cholelithiasis with largest stone measuring 1.4 cm. Positive sonographic Murphy sign with mild adjacent free fluid. Edematous gallbladder wall thickening measuring 7.2 mm which is nonspecific in the setting of mild ascites. Findings may be due to acute cholecystitis, although note the patient  had a negative HIDA scan for acute cholecystitis 07/10/2018. Common bile duct: Diameter: 4.7 mm. Liver: No focal lesion identified. Within normal limits in parenchymal echogenicity. Portal vein is patent on color Doppler imaging with normal direction of blood flow towards the liver. IMPRESSION: Moderate cholelithiasis with positive sonographic Murphy sign. Nonspecific gallbladder wall thickening of 7.2 mm in the setting of mild ascites. Findings may be due to acute cholecystitis. Electronically Signed   By: Marin Olp M.D.   On: 07/31/2018 17:19    Pending Labs Unresulted Labs (From admission, onward)     Start     Ordered   07/31/18 1227  Lactic acid, plasma  Now then every 2 hours,   STAT     07/31/18 1226   Signed and Held  Magnesium  Tomorrow morning,   R    Comments: Call MD if <1.5    Signed and Held   Signed and Held  Phosphorus  Tomorrow morning,   R     Signed and Held   Signed and Held  TSH  Once,   R    Comments: Cancel if already done within 1 month and notify MD    Signed and Held   Signed and Held  Comprehensive metabolic panel  Once,   R    Comments: Cal MD for K<3.5 or >5.0    Signed and Held   Signed and Held  CBC  Once,   R    Comments: Call for hg <8.0    Signed and Held   Signed and Held  Hemoglobin A1c  Tomorrow morning,   R    Comments: To assess prior glycemic control    Signed and Held          Vitals/Pain Today's Vitals   07/31/18 1724 07/31/18 2013 07/31/18 2030 07/31/18 2045  BP:   (!) 193/137 (!) 149/97  Pulse:    78  Resp:      Temp: (!) 96.7 F (35.9 C)     TempSrc: Oral     SpO2:    90%  PainSc:  9       Isolation Precautions No active isolations  Medications Medications  cefTRIAXone (ROCEPHIN) 2 g in sodium chloride 0.9 % 100 mL IVPB (2 g Intravenous New Bag/Given 07/31/18 2022)  fentaNYL (SUBLIMAZE) injection 50 mcg (50 mcg Intravenous Given 07/31/18 1239)  ondansetron (ZOFRAN) injection 4 mg (4 mg Intravenous Given 07/31/18 1239)  fentaNYL (SUBLIMAZE) injection 50 mcg (50 mcg Intravenous Given 07/31/18 1638)  fentaNYL (SUBLIMAZE) injection 50 mcg (50 mcg Intravenous Given 07/31/18 2051)    Mobility walks with device Moderate fall risk   Focused Assessments Pulmonary Assessment Handoff:  Lung sounds:   O2 Device: Room Air        R Recommendations: See Admitting Provider Note  Report given to:   Additional Notes:

## 2018-07-31 NOTE — Progress Notes (Signed)
Renal Navigator received call from Renal PA/L. Penninger, Renal PA requesting to get patient's OP HD appointment (from today) rescheduled for tomorrow as he is not expected to be admitted.  Renal Navigator spoke with OP HD/NW Clinic Manager who states they have no available appointments open for tomorrow. Renal Navigator has alerted Director of Operations to lack of availability and she is looking in to the situation to see if patient can be accommodated.  Director of Operations will call Renal Navigator back as soon as possible. Of note, patient was in ED last week requiring a rescheduled appointment for missing regularly scheduled appointment and OP HD clinic accommodated him.   Alphonzo Cruise, [place] Renal Navigator [phone]

## 2018-07-31 NOTE — ED Triage Notes (Signed)
Pt here from Dialysis with c/io abd pain that radiates to the back , no n/v , pt was here a few weeks ago with same complaint , pt only did 15 mins of dialysis

## 2018-07-31 NOTE — ED Provider Notes (Signed)
Transfer of Care Note  I assumed care of Matthew Kline on 08/01/2018 from Arlean Hopping, PA-C  Briefly, Matthew Kline is a 58 y.o. male who:  Presents to the ED with acute on chronic abdominal pain  ESRD on M/W/F HD. Did not receive his regularly schedule hemodialysis today, showed up to the appointment and was sent here after reporting the abdominal pain.   The plan includes:  F/u RUQ [place]   F/u Nephrology recommendations    Please refer to the original provider's note for details of the patient's HPI & work-up prior to handoff to this physician.  I have reviewed the HPI, exam, labs and imaging results obtained prior to taking over patient care, and I have received a verbal handoff from the above provider.  Agree with history, physical exam and plan.   Reassessment: I personally reassessed the patient:  Vital Signs:  The most current vitals were  Vitals:   08/01/18 1130 08/01/18 1159  BP: 134/78 (!) 143/83  Pulse: 66 70  Resp: 14 18  Temp:  98.4 F (36.9 C)  SpO2:  96%    Hemodynamics:  The patient is hemodynamically stable. Mental Status:  The patient is alert Pertinent PE findings: moderate abd TTP, periumbilical and RUQ >>  Additional MDM:  1550 hours: Arlean Hopping, PA-C spoke with on-call Nephrologist who will try to get the patient in for a make-up HD session tomorrow. Will call back with confirmation   1830 hours: Reassessment of Matthew Kline following abdominal US revealed he still has marked tenderness, he did get some mild relief from the pain medication previously given. US shows evidence of possible acute cholecystitis. Will touch base with Surgery   1915 hours:  Surgery consultant will see patient and requests he be admitted to hospitalist service.    The plan for this patient was discussed with my attending physician, Dr. Blanchie Dessert, who voiced agreement and who oversaw evaluation and treatment of this patient.   CLINICAL IMPRESSION: 1. Cholecystitis    2. Upper abdominal pain     DISPOSITION:  Admit  Kristell Wooding A. Jimmye Norman, MD Resident Physician, PGY-3 Emergency Medicine [facility]    Jefm Petty, MD 08/01/18 1457    Blanchie Dessert, MD 08/02/18 [phone]

## 2018-07-31 NOTE — Progress Notes (Signed)
Patient has been given an OP HD appointment for 07/23/18 at [facility] ([address], [place]-NOT his regular clinic) at 12:30pm. He needs to arrive at 12:10pm.  Renal Navigator notified Renal PA and EDP Dr. Maryan Rued.   Alphonzo Cruise, [place] Renal Navigator [phone]

## 2018-07-31 NOTE — Progress Notes (Signed)
Pharmacy Antibiotic Note  Matthew Kline is a 58 y.o. male admitted on 07/31/2018 with abdominal pain.  Pharmacy has been consulted for Zosyn dosing for intra-abdominal infection.  Patient has ESRD on MWF and received about 15 min of his session today.  He is hypothermic with WNL WBC.  Plan: D/C Rocephin Zosyn EID 3.375gm IV Q12H Pharmacy will sign off as dosage adjustment is unnecessary for dialysis patient.  Thank you for the consult!    Temp (24hrs), Avg:96.7 F (35.9 C), Min:96.7 F (35.9 C), Max:96.7 F (35.9 C)  Recent Labs  Lab 07/26/18 1143 07/31/18 1221 07/31/18 1245  WBC 3.9* 4.7  --   CREATININE 8.51* 7.41*  --   LATICACIDVEN  --   --  1.1    Estimated Creatinine Clearance: 11.4 mL/min (A) (by C-G formula based on SCr of 7.41 mg/dL (H)).    Allergies  Allergen Reactions  . Morphine And Related Other (See Comments)    hallucinations  . Tygacil [Tigecycline] Nausea And Vomiting and Other (See Comments)    Makes him feel crazy  . Imodium [Loperamide] Itching    Zosyn 7/20 >> CTX x1 7/20  7/20 covid - negative  Matthew Kline D. Mina Marble, PharmD, BCPS, [place] 07/31/2018, 10:13 PM

## 2018-07-31 NOTE — Consult Note (Signed)
Surgical Consultation Requesting provider: Dr. 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  He states that he has been experiencing pain across the mid upper abdomen radiating into the back intermittently for quite some time.  Today the pain became unbearable, causing him to truncate his dialysis session after about 15 minutes and present to the ER.  Reports associated nausea.  He motions to the epigastric and right upper quadrant region when describing his pain, and also notes a nodule just above the umbilicus that is tender.  He had a pretty extensive work-up last time he was here including a CT NGO which did demonstrate gallstones, moderate ascites, bilateral inguinal hernias (containing small bowel in the right and fat on the left (.  HIDA was negative at that time.  Gallbladder ejection fraction was low at 27%.  Today, his lab work is remarkable only for creatinine of 7.4, LFTs are normal, lipase is 26, white count is 4.7, hemoglobin 10.5 which appears to be his baseline.  Ultrasound confirms cholelithiasis up to 1.4 cm in size, positive sonographic Murphy sign, and gallbladder wall thickening to 7.2 mm.  There is also some adjacent free fluid although he does have ascites as of his CT scan in 3 weeks ago.  He states that his hernias continue to bother him, the left one in particular is quite painful and it hinders his ability to use his prosthesis.  Plan at last consultation was for him to follow-up with his surgeon, Dr. Raul Del regarding the  hernias.  Allergies  Allergen Reactions  . Morphine And Related Other (See Comments)    hallucinations  . Tygacil [Tigecycline] Nausea And Vomiting and Other (See Comments)    Makes him feel crazy  . Imodium [Loperamide] Itching    Past Medical History:  Diagnosis Date  . Anemia, unspecified   . Anxiety   . Arthralgia 2010   polyarticular  . Arthritis    "back, knees" (01/10/2017)  . Cancer [facility])    "kidney area" (01/10/2017)  . CHF (congestive heart failure) ([place]) 07/25/2009   denies  . Chronic lower back pain   . Coronary artery disease   . Coughing    pt. reports that he has drainage from sinus infection  . Diabetic foot ulcer ([place])   . Diabetic neuropathy ([place])   . ESRD (end stage renal disease) on dialysis [facility])    started 12/2012; "MWF; [place] "  (01/10/2017)  . GERD (gastroesophageal reflux disease)    hx "before I lost weight", no problem 9 years  . Hemodialysis access site with mature fistula ([place])   . Hemorrhoids, internal 10/2011   small  . High cholesterol   . History of blood transfusion    "related to the anemia"  . Hypertension   . Insomnia, unspecified   . Lacunar infarction ([place]) 2006   RUE/RLE, speech  . Long term (current) use of anticoagulants   . Myocardial infarction ([place]) 1995  . Orthostatic hypotension   . Osteomyelitis of foot, left, acute ([place])   . Other chronic postoperative pain   . Pneumonia    "  probably twice" (01/10/2017)  . Polymyalgia rheumatica ([place])   . Renal insufficiency   . Sleep apnea    "lost weight; no more problem" (01/10/2017)  . Stroke ([place]) 01/10/06   denies residual on 05/09/2014  . Type II diabetes mellitus ([place]) dx'd 1995  . Unspecified hereditary and idiopathic peripheral neuropathy    feet  . Unspecified osteomyelitis, site unspecified   . Unspecified vitamin D deficiency     Past Surgical History:  Procedure Laterality Date  . ABDOMINAL AORTOGRAM N/A 08/25/2016   Procedure: ABDOMINAL AORTOGRAM;   Surgeon: Wellington Hampshire, MD;  Location: [place] [facility];  Service: Cardiovascular;  Laterality: N/A;  . AMPUTATION  01/21/2012   Procedure: AMPUTATION RAY;  Surgeon: [name], MD;  Location: [place];  Service: Orthopedics;  Laterality: Left;  Left Foot 4th Ray Amputation  . AMPUTATION Left 05/04/2013   Procedure: AMPUTATION DIGIT;  Surgeon: [name], MD;  Location: [place];  Service: Orthopedics;  Laterality: Left;  Left Great Toe Amputation at [facility]  . AMPUTATION Left 01/14/2017   Procedure: AMPUTATION ABOVE LEFT KNEE;  Surgeon: [name], MD;  Location: [place];  Service: Orthopedics;  Laterality: Left;  . ANTERIOR CERVICAL DECOMP/DISCECTOMY FUSION  02/2011  . BACK SURGERY    . BASCILIC VEIN TRANSPOSITION Left 10/19/2012   Procedure: BASCILIC VEIN TRANSPOSITION;  Surgeon: Serafina Mitchell, MD;  Location: [place];  Service: Vascular;  Laterality: Left;  . CARDIAC CATHETERIZATION     "before bypass"  . CORONARY ARTERY BYPASS GRAFT     x 5 with lima at [place] WITH ANTIBIOTIC SPACERS Left 08/07/2014   Procedure: Replace Left Total Knee Arthroplasty,  Place Antibiotic Spacer;  Surgeon: [name], MD;  Location: [place];  Service: Orthopedics;  Laterality: Left;  . I&D EXTREMITY Left 05/09/2014   Procedure: Irrigation and Debridement Left Knee and Closure of Total Knee Arthroplasty Incision;  Surgeon: [name], MD;  Location: [place];  Service: Orthopedics;  Laterality: Left;  . I&D KNEE WITH POLY EXCHANGE Left 05/31/2014   Procedure: IRRIGATION AND DEBRIDEMENT LEFT KNEE, PLACE ANTIBIOTIC BEADS,  POLY EXCHANGE;  Surgeon: [name], MD;  Location: [place];  Service: Orthopedics;  Laterality: Left;  . IRRIGATION AND DEBRIDEMENT KNEE Left 01/12/2017   Procedure: IRRIGATION AND DEBRIDEMENT LEFT KNEE;  Surgeon: [name], MD;  Location: [place];  Service: Orthopedics;  Laterality: Left;  . JOINT REPLACEMENT    . KNEE ARTHROSCOPY Left 08-25-2012  .  LOWER EXTREMITY ANGIOGRAPHY Left 08/25/2016   Procedure: Lower Extremity Angiography;  Surgeon: Wellington Hampshire, MD;  Location: [place] [facility];  Service: Cardiovascular;  Laterality: Left;  . PERIPHERAL VASCULAR BALLOON ANGIOPLASTY Left 08/25/2016   Procedure: PERIPHERAL VASCULAR BALLOON ANGIOPLASTY;  Surgeon: Wellington Hampshire, MD;  Location: [place] [facility];  Service: Cardiovascular;  Laterality: Left;  lt peroneal and ant tibial arteries cutting balloon  . REFRACTIVE SURGERY Bilateral   . TOE AMPUTATION Bilateral    "I've lost 7 toes over the last 7 years" (05/09/2014)  . TOE SURGERY Left April 2015   Big toe removed on left foot.  . TONSILLECTOMY    . TOTAL KNEE ARTHROPLASTY Left 04/10/2014   Procedure: TOTAL KNEE ARTHROPLASTY;  Surgeon: [name], MD;  Location: [place];  Service: Orthopedics;  Laterality: Left;  . TOTAL KNEE REVISION Left 10/25/2014   Procedure: LEFT TOTAL KNEE REVISION;  Surgeon: Meridee Score  V, MD;  Location: [place];  Service: Orthopedics;  Laterality: Left;  . TOTAL KNEE REVISION Left 11/26/2015   Procedure: Removal Left Total Knee Arthroplasty, Hinged Total Knee Arthroplasty;  Surgeon: [name], MD;  Location: [place];  Service: Orthopedics;  Laterality: Left;  . UVULOPALATOPHARYNGOPLASTY, TONSILLECTOMY AND SEPTOPLASTY  ~ 1989  . WOUND DEBRIDEMENT Left 05/09/2014   Dehiscence Left Total Knee Arthroplasty Incision    Family History  Problem Relation Age of Onset  . Hypertension Mother   . Cancer Mother 64       Ovarian  . Heart disease Maternal Aunt   . Stroke Maternal Grandfather     Social History   Socioeconomic History  . Marital status: Legally Separated    Spouse name: Not on file  . Number of children: Not on file  . Years of education: Not on file  . Highest education level: Not on file  Occupational History  . Not on file  Social Needs  . Financial resource strain: Not on file  . Food insecurity    Worry: Not on file    Inability:  Not on file  . Transportation needs    Medical: Not on file    Non-medical: Not on file  Tobacco Use  . Smoking status: Former Smoker    Packs/day: 0.12    Years: 32.00    Pack years: 3.84    Types: Cigarettes    Quit date: 10/05/2016    Years since quitting: 1.8  . Smokeless tobacco: Never Used  Substance and Sexual Activity  . Alcohol use: No    Alcohol/week: 0.0 standard drinks  . Drug use: No  . Sexual activity: Not Currently  Lifestyle  . Physical activity    Days per week: Not on file    Minutes per session: Not on file  . Stress: Not on file  Relationships  . Social Herbalist on phone: Not on file    Gets together: Not on file    Attends religious service: Not on file    Active member of club or organization: Not on file    Attends meetings of clubs or organizations: Not on file    Relationship status: Not on file  Other Topics Concern  . Not on file  Social History Narrative  . Not on file    No current facility-administered medications on file prior to encounter.    Current Outpatient Medications on File Prior to Encounter  Medication Sig Dispense Refill  . amLODipine (NORVASC) 5 MG tablet Take 5 mg by mouth daily.     Marland Kitchen aspirin EC 81 MG EC tablet Take 2 tablets (162 mg total) by mouth daily. (Patient taking differently: Take 81 mg by mouth daily. )    . carvedilol (COREG) 12.5 MG tablet Take 1 tablet (12.5 mg total) by mouth 2 (two) times daily with a meal. 60 tablet 0  . Darbepoetin Alfa (ARANESP) 150 MCG/0.3ML SOSY injection Inject 0.3 mLs (150 mcg total) into the vein every Friday with hemodialysis. 1.68 mL 0  . dicyclomine (BENTYL) 20 MG tablet Take 1 tablet (20 mg total) by mouth every 6 (six) hours as needed for spasms (abdominal pain). 12 tablet 0  . hydrOXYzine (ATARAX/VISTARIL) 50 MG tablet Take 50 mg by mouth 3 (three) times daily as needed for itching.     . multivitamin (RENA-VIT) TABS tablet Take 1 tablet by mouth at bedtime. 30 tablet 0   . sevelamer carbonate (RENVELA) 800  MG tablet Take 32,000 mg by mouth 3 (three) times daily with meals.     . temazepam (RESTORIL) 30 MG capsule Take 30 mg by mouth at bedtime as needed for sleep.    . Amino Acids-Protein Hydrolys (FEEDING SUPPLEMENT, PRO-STAT SUGAR FREE 64,) LIQD Take 30 mLs by mouth 2 (two) times daily. (Patient not taking: Reported on 11/30/2017) 900 mL 0  . ferric citrate (AURYXIA) 1 GM 210 MG(Fe) tablet Take 2 tablets (420 mg total) by mouth 3 (three) times daily with meals. (Patient not taking: Reported on 11/30/2017) 180 tablet 0  . gabapentin (NEURONTIN) 300 MG capsule TAKE 1 CAPSULE BY MOUTH THREE TIMES A DAY (Patient not taking: No sig reported) 270 capsule 0  . montelukast (SINGULAIR) 10 MG tablet TAKE 1 TABLET (10 MG TOTAL) BY MOUTH AT BEDTIME. (Patient not taking: No sig reported) 90 tablet 1  . Nutritional Supplements (FEEDING SUPPLEMENT, NEPRO CARB STEADY,) LIQD Take 237 mLs by mouth 2 (two) times daily between meals. (Patient not taking: Reported on 11/30/2017)  0  . ondansetron (ZOFRAN ODT) 4 MG disintegrating tablet Take 1 tablet (4 mg total) by mouth every 8 (eight) hours as needed for nausea or vomiting. (Patient not taking: Reported on 07/10/2018) 20 tablet 0  . pentoxifylline (TRENTAL) 400 MG CR tablet TAKE 1 TABLET (400 MG TOTAL) BY MOUTH 3 (THREE) TIMES DAILY WITH MEALS. (Patient not taking: Reported on 07/10/2018) 90 tablet 1  . traMADol (ULTRAM) 50 MG tablet Take 1 tablet (50 mg total) by mouth every 6 (six) hours as needed. (Patient not taking: Reported on 07/10/2018) 15 tablet 0    Review of Systems: a complete, 10pt review of systems was completed with pertinent positives and negatives as documented in the HPI  Physical Exam: Vitals:   07/31/18 1545 07/31/18 1724  BP: 133/84   Pulse: 71   Resp: 19   Temp:  (!) 96.7 F (35.9 C)  SpO2: 100%    Gen: A&Ox3, is trembling and appears uncomfortable Head: normocephalic, atraumatic Eyes: extraocular  motions intact, anicteric.  Neck: supple without mass or thyromegaly Chest: unlabored respirations, symmetrical air entry Cardiovascular: RRR  Abdomen: soft, nondistended, tender with voluntary guarding in the right upper quadrant.  Small chronically incarcerated epigastric hernia about 2 cm superior to and just to the right of the umbilicus, correlates to small hernia visible on CT scan.  No mass or organomegaly.  Inguinal hernias not examined at this time, as patient is in a hall bed in plain view of the entryway/main thorough fair of the ER. Extremities: warm, status post left AKA Neuro: grossly intact Psych: appropriate mood and anxious affect, normal insight  Skin: warm and dry   CBC Latest Ref Rng & Units 07/31/2018 07/26/2018 07/12/2018  WBC 4.0 - 10.5 K/uL 4.7 3.9(L) 3.8(L)  Hemoglobin 13.0 - 17.0 g/dL 10.5(L) 10.9(L) 10.1(L)  Hematocrit 39.0 - 52.0 % 33.3(L) 34.7(L) 32.3(L)  Platelets 150 - 400 K/uL 124(L) 112(L) 119(L)    CMP Latest Ref Rng & Units 07/31/2018 07/26/2018 07/12/2018  Glucose 70 - 99 mg/dL 132(H) 114(H) 82  BUN 6 - 20 mg/dL 25(H) 43(H) 38(H)  Creatinine 0.61 - 1.24 mg/dL 7.41(H) 8.51(H) 6.54(H)  Sodium 135 - 145 mmol/L 140 137 134(L)  Potassium 3.5 - 5.1 mmol/L 4.6 5.4(H) 4.3  Chloride 98 - 111 mmol/L 101 98 97(L)  CO2 22 - 32 mmol/L 26 25 25   Calcium 8.9 - 10.3 mg/dL 9.5 9.8 9.6  Total Protein 6.5 - 8.1 g/dL 7.1 6.9 -  Total Bilirubin 0.3 - 1.2 mg/dL 1.1 1.3(H) -  Alkaline Phos 38 - 126 U/L 88 88 -  AST 15 - 41 U/L 16 18 -  ALT 0 - 44 U/L 11 11 -    Lab Results  Component Value Date   INR 1.30 11/08/2016   INR 1.2 08/17/2016   INR 1.15 10/07/2014    Imaging: US Abdomen Limited Ruq  Result Date: 07/31/2018 CLINICAL DATA:  Upper abdominal pain several months worse today. EXAM: ULTRASOUND ABDOMEN LIMITED RIGHT UPPER QUADRANT COMPARISON:  HIDA scan 07/10/2018 and CT 07/09/2018 FINDINGS: Gallbladder: Moderate cholelithiasis with largest stone measuring 1.4 cm.  Positive sonographic Murphy sign with mild adjacent free fluid. Edematous gallbladder wall thickening measuring 7.2 mm which is nonspecific in the setting of mild ascites. Findings may be due to acute cholecystitis, although note the patient had a negative HIDA scan for acute cholecystitis 07/10/2018. Common bile duct: Diameter: 4.7 mm. Liver: No focal lesion identified. Within normal limits in parenchymal echogenicity. Portal vein is patent on color Doppler imaging with normal direction of blood flow towards the liver. IMPRESSION: Moderate cholelithiasis with positive sonographic Murphy sign. Nonspecific gallbladder wall thickening of 7.2 mm in the setting of mild ascites. Findings may be due to acute cholecystitis. Electronically Signed   By: Marin Olp M.D.   On: 07/31/2018 17:38    A/P: 57 year old gentleman with end-stage renal disease among other comorbidities, acute calculus cholecystitis by exam and imaging.  Recommend medical admission for optimization of comorbidities, cardiac clearance, completion of today's dialysis session, and ultimately laparoscopic cholecystectomy if he is deemed to be a suitable risk.   Romana Juniper, MD [facility] Surgery, [place] Pager [phone]

## 2018-07-31 NOTE — H&P (Addendum)
Matthew Kline NID:[id number] DOB: March 17, 1960 DOA: 07/31/2018     PCP: [facility] [place]   Outpatient Specialists:  CARDS:  [facility], [place]  NEphrology:    Dr. Elisabeth Most    Patient arrived to ER on 07/31/18 at 1208  Patient coming from: home Lives  With family daughter     Chief Complaint:  Chief Complaint  Patient presents with  . Abdominal Pain    HPI: Matthew Kline is a 58 y.o. male with medical history significant of anemia, anxiety, ESRD on dialysis, GERD, DM 2,  AKA, chronic abd pain anemia, inguinal hernia , CAD status post CABG, Polymyalgia rheumatica     Presented with   abdominal pain he has been having persistent pain across his mid upper abdomen radiating to the back today became so unbearable that he presented to emergency department he had to stop his dialysis and session 50 minutes into it because he could not tolerate the pain.  Associated some nausea, Pain gets worse with fatty meals. Denies any fever, reports loose  mucousy floating stools,  He reported to the surgeon that his hernia has been hurting him  particularly on the left He Had extensive  prior work up of abd pain Had been seen in ER for the same on 28th of June Had Ct scan done nonobstructive inguinal hernia moderate ascites and gallstones was admitted and further evaluated including a HIDA scan  Discharged on 1 July 6/29 revealed patent cystic duct, mildly low gallbladder EF His bilateral inguinal hernia was evaluated by general surgery recommended no surgical intervention at that time and states the patient needed to follow-up with his primary surgeon in [facility] for elective procedure and have cardiac clearance prior to proceeding patient followed by cardiology at [facility]  Denies any chest pain no shortness of breath  But reports some chest pain 2 weeks ago but it was brief    infectious risk factors:  Reports Nausea abdominal pain,   travelers from high risk area    In  ER RAPID COVID TEST NEGATIVE    Regarding pertinent Chronic problems:        HTN on coreg and Norvasc      CAD  - On Aspirin, , betablocker,                  -  followed by cardiology at [facility]          02/08/2017 - echo LV EF: 50% -   55%         DM 2 -  Lab Results  Component Value Date   HGBA1C 5.3 07/10/2018    diet controlled     Hx of CVA -  With out residual deficits on Aspirin 81 mg,      ESRD-  On HD M,W, F Left arm fistula Makes urine once a day  While in ER: RUQ [place] gall stones thickened  LFTs and normal lipase 26 white blood cell count 4.7 The following Work up has been ordered so far:  Orders Placed This Encounter  Procedures  . SARS Coronavirus 2 (CEPHEID - Performed in [place] hospital lab), [facility]  . Surgical pcr screen  . US Abdomen Limited RUQ  . Comprehensive metabolic panel  . CBC with Differential  . Lipase, blood  . Lactic acid, plasma  . Magnesium  . Phosphorus  . TSH  . Comprehensive metabolic panel  . CBC  . Hemoglobin A1c  .  Glucose, capillary  . Diet NPO time specified Except for: Sips with Meds  . Check temperature  . Cardiac monitoring  . Vital signs  . Notify physician  . Up with assistance  . If patient diabetic or glucose greater than 140 notify physician for Sliding Scale Insulin Orders  . May go off telemetry for tests/procedures  . Oral care per nursing protocol  . Initiate Oral Care Protocol  . Initiate Carrier Fluid Protocol  . RN may order General Admission PRN Orders utilizing "General Admission PRN medications" (through manage orders) for the following patient needs: allergy symptoms (Claritin), cold sores (Carmex), cough (Robitussin DM), eye irritation (Liquifilm Tears), hemorrhoids (Tucks), indigestion (Maalox), minor skin irritation (Hydrocortisone Cream), muscle pain Suezanne Jacquet Gay), nose irritation (saline nasal spray) and sore throat (Chloraseptic spray).  . SCDs   . Patient has an active order for admit to inpatient/place in observation  . Cardiac Monitoring Continuous x 24 hours Indications for use: Other; Other indications for use: prolonged qt  . STAT CBG when hypoglycemia is suspected. If treated, recheck every 15 minutes after each treatment until CBG >/= 70 mg/dl  . Refer to Hypoglycemia Protocol Sidebar Report for treatment of CBG < 70 mg/dl  . No basal insulin at this time  . Full code  . Consult to nephrology  ALL PATIENTS BEING ADMITTED/HAVING PROCEDURES NEED COVID-19 SCREENING  . Consult to general surgery  ALL PATIENTS BEING ADMITTED/HAVING PROCEDURES NEED COVID-19 SCREENING Dr. Dian Situ [phone]  . Consult to hospitalist  ALL PATIENTS BEING ADMITTED/HAVING PROCEDURES NEED COVID-19 SCREENING Dr Dian Situ [phone]  . OT eval and treat  . PT eval and treat  . Pulse oximetry check with vital signs  . Oxygen therapy Mode or (Route): Nasal cannula; Liters Per Minute: 2; Keep 02 saturation: greater than 92 %  . Incentive spirometry  . EKG 12-Lead  . EKG 12-Lead  . EKG 12-Lead  . ECHOCARDIOGRAM COMPLETE  . Saline lock IV  . Admit to Inpatient (patient's expected length of stay will be greater than 2 midnights or inpatient only procedure)    Following Medications were ordered in ER: Medications  amLODipine (NORVASC) tablet 5 mg (has no administration in time range)  carvedilol (COREG) tablet 12.5 mg (has no administration in time range)  temazepam (RESTORIL) capsule 30 mg (has no administration in time range)  acetaminophen (TYLENOL) tablet 650 mg (has no administration in time range)    Or  acetaminophen (TYLENOL) suppository 650 mg (has no administration in time range)  sodium chloride flush (NS) 0.9 % injection 3 mL (has no administration in time range)  sodium chloride flush (NS) 0.9 % injection 3 mL (has no administration in time range)  0.9 %  sodium chloride infusion (has no administration in time range)  insulin aspart (novoLOG) injection  0-9 Units (has no administration in time range)  piperacillin-tazobactam (ZOSYN) IVPB 3.375 g (has no administration in time range)  fentaNYL (SUBLIMAZE) injection 50 mcg (50 mcg Intravenous Given 07/31/18 1239)  ondansetron (ZOFRAN) injection 4 mg (4 mg Intravenous Given 07/31/18 1239)  fentaNYL (SUBLIMAZE) injection 50 mcg (50 mcg Intravenous Given 07/31/18 1638)  fentaNYL (SUBLIMAZE) injection 50 mcg (50 mcg Intravenous Given 07/31/18 2051)        Consult Orders  (From admission, onward)         Start     Ordered   07/31/18 2311  PT eval and treat  Routine     07/31/18 2310   07/31/18 2311  OT eval and treat  Routine     07/31/18 2310   07/31/18 1920  Consult to hospitalist  ALL PATIENTS BEING ADMITTED/HAVING PROCEDURES NEED COVID-19 SCREENING Dr Dian Situ [phone]  Once    Comments: ALL PATIENTS BEING ADMITTED/HAVING PROCEDURES NEED COVID-19 SCREENING Dr Dian Situ [phone]  Provider:  (Not yet assigned)  Question Answer Comment  Place call to: [facility]   Reason for Consult Admit      07/31/18 1920          ER Provider Called:     Dr.Conner They Recommend admit to medicine  Will see in ER   Significant initial  Findings: Abnormal Labs Reviewed  COMPREHENSIVE METABOLIC PANEL - Abnormal; Notable for the following components:      Result Value   Glucose, Bld 132 (*)    BUN 25 (*)    Creatinine, Ser 7.41 (*)    GFR calc non Af Amer 7 (*)    GFR calc Af Amer 8 (*)    All other components within normal limits  CBC WITH DIFFERENTIAL/PLATELET - Abnormal; Notable for the following components:   RBC 3.41 (*)    Hemoglobin 10.5 (*)    HCT 33.3 (*)    Platelets 124 (*)    All other components within normal limits    Otherwise labs showing:    Recent Labs  Lab 07/26/18 1143 07/31/18 1221  NA 137 140  K 5.4* 4.6  CO2 25 26  GLUCOSE 114* 132*  BUN 43* 25*  CREATININE 8.51* 7.41*  CALCIUM 9.8 9.5    Cr  Lab Results  Component Value Date   CREATININE 7.41 (H)  07/31/2018   CREATININE 8.51 (H) 07/26/2018   CREATININE 6.54 (H) 07/12/2018    Recent Labs  Lab 07/26/18 1143 07/31/18 1221  AST 18 16  ALT 11 11  ALKPHOS 88 88  BILITOT 1.3* 1.1  PROT 6.9 7.1  ALBUMIN 3.4* 3.7   Lab Results  Component Value Date   CALCIUM 9.5 07/31/2018   PHOS 5.0 (H) 07/12/2018      WBC      Component Value Date/Time   WBC 4.7 07/31/2018 1221   ANC    Component Value Date/Time   NEUTROABS 3.2 07/31/2018 1221   NEUTROABS 5.5 12/13/2012 1219   ALC No results found for: LYMPHOABS    Plt: Lab Results  Component Value Date   PLT 124 (L) 07/31/2018    Lactic Acid, Venous    Component Value Date/Time   LATICACIDVEN 1.1 07/31/2018 1245       COVID-19 Labs     Lab Results  Component Value Date   SARSCOV2NAA NEGATIVE 07/31/2018   [place] NEGATIVE 07/10/2018      HG/HCT   stable,  from baseline see below    Component Value Date/Time   HGB 10.5 (L) 07/31/2018 1221   HGB 10.4 (L) 08/17/2016 1203   HCT 33.3 (L) 07/31/2018 1221   HCT 35.1 (L) 08/17/2016 1203    Recent Labs  Lab 07/26/18 1143 07/31/18 1221  LIPASE 23 26    DM  labs:  HbA1C: Recent Labs    07/10/18 1851  HGBA1C 5.3         UA not ordered      RUQ [place] - Moderate cholelithiasis with positive sonographic Murphy sign  ECG:  Personally reviewed by me showing: HR : 65 Rhythm:  NSR, S    no evidence of ischemic changes QTC 518      ED Triage Vitals  Enc Vitals  Group     BP 07/31/18 1213 (!) 157/99     Pulse Rate 07/31/18 1213 88     Resp 07/31/18 1213 20     Temp 07/31/18 1724 (!) 96.7 F (35.9 C)     Temp Source 07/31/18 1724 Oral     SpO2 07/31/18 1225 100 %     Weight --      Height --      Head Circumference --      Peak Flow --      Pain Score 07/31/18 1211 10     Pain Loc --      Pain Edu? --      Excl. in [place]? --   TMAX(24)@       Latest  Blood pressure 133/84, pulse 71, temperature (!) 96.7 F (35.9 C), temperature source Oral,  resp. rate 19, SpO2 100 %.     Hospitalist was called for admission for acute calculus cholecystitis by exam and imaging   Review of Systems:    Pertinent positives include: fatigue abdominal pain, nausea  diarrhea,   Constitutional:  No weight loss, night sweats, Fevers, chills, , weight loss  HEENT:  No headaches, Difficulty swallowing,Tooth/dental problems,Sore throat,  No sneezing, itching, ear ache, nasal congestion, post nasal drip,  Cardio-vascular:  No chest pain, Orthopnea, PND, anasarca, dizziness, palpitations.no Bilateral lower extremity swelling  GI:  No heartburn, indigestion,, vomiting,change in bowel habits, loss of appetite, melena, blood in stool, hematemesis Resp:  no shortness of breath at rest. No dyspnea on exertion, No excess mucus, no productive cough, No non-productive cough, No coughing up of blood.No change in color of mucus.No wheezing. Skin:  no rash or lesions. No jaundice GU:  no dysuria, change in color of urine, no urgency or frequency. No straining to urinate.  No flank pain.  Musculoskeletal:  No joint pain or no joint swelling. No decreased range of motion. No back pain.  Psych:  No change in mood or affect. No depression or anxiety. No memory loss.  Neuro: no localizing neurological complaints, no tingling, no weakness, no double vision, no gait abnormality, no slurred speech, no confusion  All systems reviewed and apart from [place] all are negative  Past Medical History:   Past Medical History:  Diagnosis Date  . Anemia, unspecified   . Anxiety   . Arthralgia 2010   polyarticular  . Arthritis    "back, knees" (01/10/2017)  . Cancer [facility])    "kidney area" (01/10/2017)  . CHF (congestive heart failure) ([place]) 07/25/2009   denies  . Chronic lower back pain   . Coronary artery disease   . Coughing    pt. reports that he has drainage from sinus infection  . Diabetic foot ulcer ([place])   . Diabetic neuropathy ([place])   . ESRD (end stage  renal disease) on dialysis [facility])    started 12/2012; "MWF; [place] "  (01/10/2017)  . GERD (gastroesophageal reflux disease)    hx "before I lost weight", no problem 9 years  . Hemodialysis access site with mature fistula ([place])   . Hemorrhoids, internal 10/2011   small  . High cholesterol   . History of blood transfusion    "related to the anemia"  . Hypertension   . Insomnia, unspecified   . Lacunar infarction ([place]) 2006   RUE/RLE, speech  . Long term (current) use of anticoagulants   . Myocardial infarction ([place]) 1995  . Orthostatic hypotension   . Osteomyelitis of foot,  left, acute ([place])   . Other chronic postoperative pain   . Pneumonia    "probably twice" (01/10/2017)  . Polymyalgia rheumatica ([place])   . Renal insufficiency   . Sleep apnea    "lost weight; no more problem" (01/10/2017)  . Stroke ([place]) 01/10/06   denies residual on 05/09/2014  . Type II diabetes mellitus ([place]) dx'd 1995  . Unspecified hereditary and idiopathic peripheral neuropathy    feet  . Unspecified osteomyelitis, site unspecified   . Unspecified vitamin D deficiency       Past Surgical History:  Procedure Laterality Date  . ABDOMINAL AORTOGRAM N/A 08/25/2016   Procedure: ABDOMINAL AORTOGRAM;  Surgeon: Wellington Hampshire, MD;  Location: [place] [facility];  Service: Cardiovascular;  Laterality: N/A;  . AMPUTATION  01/21/2012   Procedure: AMPUTATION RAY;  Surgeon: [name], MD;  Location: [place];  Service: Orthopedics;  Laterality: Left;  Left Foot 4th Ray Amputation  . AMPUTATION Left 05/04/2013   Procedure: AMPUTATION DIGIT;  Surgeon: [name], MD;  Location: [place];  Service: Orthopedics;  Laterality: Left;  Left Great Toe Amputation at [facility]  . AMPUTATION Left 01/14/2017   Procedure: AMPUTATION ABOVE LEFT KNEE;  Surgeon: [name], MD;  Location: [place];  Service: Orthopedics;  Laterality: Left;  . ANTERIOR CERVICAL DECOMP/DISCECTOMY FUSION  02/2011  . BACK SURGERY    . BASCILIC  VEIN TRANSPOSITION Left 10/19/2012   Procedure: BASCILIC VEIN TRANSPOSITION;  Surgeon: Serafina Mitchell, MD;  Location: [place];  Service: Vascular;  Laterality: Left;  . CARDIAC CATHETERIZATION     "before bypass"  . CORONARY ARTERY BYPASS GRAFT     x 5 with lima at [place] WITH ANTIBIOTIC SPACERS Left 08/07/2014   Procedure: Replace Left Total Knee Arthroplasty,  Place Antibiotic Spacer;  Surgeon: [name], MD;  Location: [place];  Service: Orthopedics;  Laterality: Left;  . I&D EXTREMITY Left 05/09/2014   Procedure: Irrigation and Debridement Left Knee and Closure of Total Knee Arthroplasty Incision;  Surgeon: [name], MD;  Location: [place];  Service: Orthopedics;  Laterality: Left;  . I&D KNEE WITH POLY EXCHANGE Left 05/31/2014   Procedure: IRRIGATION AND DEBRIDEMENT LEFT KNEE, PLACE ANTIBIOTIC BEADS,  POLY EXCHANGE;  Surgeon: [name], MD;  Location: [place];  Service: Orthopedics;  Laterality: Left;  . IRRIGATION AND DEBRIDEMENT KNEE Left 01/12/2017   Procedure: IRRIGATION AND DEBRIDEMENT LEFT KNEE;  Surgeon: [name], MD;  Location: [place];  Service: Orthopedics;  Laterality: Left;  . JOINT REPLACEMENT    . KNEE ARTHROSCOPY Left 08-25-2012  . LOWER EXTREMITY ANGIOGRAPHY Left 08/25/2016   Procedure: Lower Extremity Angiography;  Surgeon: Wellington Hampshire, MD;  Location: [place] [facility];  Service: Cardiovascular;  Laterality: Left;  . PERIPHERAL VASCULAR BALLOON ANGIOPLASTY Left 08/25/2016   Procedure: PERIPHERAL VASCULAR BALLOON ANGIOPLASTY;  Surgeon: Wellington Hampshire, MD;  Location: [place] [facility];  Service: Cardiovascular;  Laterality: Left;  lt peroneal and ant tibial arteries cutting balloon  . REFRACTIVE SURGERY Bilateral   . TOE AMPUTATION Bilateral    "I've lost 7 toes over the last 7 years" (05/09/2014)  . TOE SURGERY Left April 2015   Big toe removed on left foot.  . TONSILLECTOMY    . TOTAL KNEE ARTHROPLASTY Left  04/10/2014   Procedure: TOTAL KNEE ARTHROPLASTY;  Surgeon: [name], MD;  Location: [place];  Service: Orthopedics;  Laterality:  Left;  . TOTAL KNEE REVISION Left 10/25/2014   Procedure: LEFT TOTAL KNEE REVISION;  Surgeon: [name], MD;  Location: [place];  Service: Orthopedics;  Laterality: Left;  . TOTAL KNEE REVISION Left 11/26/2015   Procedure: Removal Left Total Knee Arthroplasty, Hinged Total Knee Arthroplasty;  Surgeon: [name], MD;  Location: [place];  Service: Orthopedics;  Laterality: Left;  . UVULOPALATOPHARYNGOPLASTY, TONSILLECTOMY AND SEPTOPLASTY  ~ 1989  . WOUND DEBRIDEMENT Left 05/09/2014   Dehiscence Left Total Knee Arthroplasty Incision    Social History:  Ambulatory   independently on prosthesis or wheelchair    reports that he quit smoking about 21 months ago. His smoking use included cigarettes. He has a 3.84 pack-year smoking history. He has never used smokeless tobacco. He reports that he does not drink alcohol or use drugs.     Family History:   Family History  Problem Relation Age of Onset  . Hypertension Mother   . Cancer Mother 33       Ovarian  . Heart disease Maternal Aunt   . Stroke Maternal Grandfather     Allergies: Allergies  Allergen Reactions  . Morphine And Related Other (See Comments)    hallucinations  . Tygacil [Tigecycline] Nausea And Vomiting and Other (See Comments)    Makes him feel crazy  . Imodium [Loperamide] Itching     Prior to Admission medications   Medication Sig Start Date End Date Taking? Authorizing Provider  amLODipine (NORVASC) 5 MG tablet Take 5 mg by mouth daily.    Yes [provider]  aspirin EC 81 MG EC tablet Take 2 tablets (162 mg total) by mouth daily. Patient taking differently: Take 81 mg by mouth daily.  01/20/17  Yes Patrecia Pour, Christean Grief, MD  carvedilol (COREG) 12.5 MG tablet Take 1 tablet (12.5 mg total) by mouth 2 (two) times daily with a meal. 02/08/17  Yes Bonnell Public, MD   Darbepoetin Alfa (ARANESP) 150 MCG/0.3ML SOSY injection Inject 0.3 mLs (150 mcg total) into the vein every Friday with hemodialysis. 02/11/17  Yes Bonnell Public, MD  dicyclomine (BENTYL) 20 MG tablet Take 1 tablet (20 mg total) by mouth every 6 (six) hours as needed for spasms (abdominal pain). 07/26/18  Yes Virgel Manifold, MD  hydrOXYzine (ATARAX/VISTARIL) 50 MG tablet Take 50 mg by mouth 3 (three) times daily as needed for itching.    Yes [provider]  multivitamin (RENA-VIT) TABS tablet Take 1 tablet by mouth at bedtime. 02/08/17  Yes Dana Allan I, MD  sevelamer carbonate (RENVELA) 800 MG tablet Take 32,000 mg by mouth 3 (three) times daily with meals.    Yes [provider]  temazepam (RESTORIL) 30 MG capsule Take 30 mg by mouth at bedtime as needed for sleep.   Yes [provider]  Amino Acids-Protein Hydrolys (FEEDING SUPPLEMENT, PRO-STAT SUGAR FREE 64,) LIQD Take 30 mLs by mouth 2 (two) times daily. Patient not taking: Reported on 11/30/2017 01/19/17   Patrecia Pour, Christean Grief, MD  ferric citrate (AURYXIA) 1 GM 210 MG(Fe) tablet Take 2 tablets (420 mg total) by mouth 3 (three) times daily with meals. Patient not taking: Reported on 11/30/2017 02/08/17   Dana Allan I, MD  gabapentin (NEURONTIN) 300 MG capsule TAKE 1 CAPSULE BY MOUTH THREE TIMES A DAY Patient not taking: No sig reported 10/24/17   [name], MD  montelukast (SINGULAIR) 10 MG tablet TAKE 1 TABLET (10 MG TOTAL) BY MOUTH AT BEDTIME. Patient not taking:  No sig reported 09/01/15   Gildardo Cranker, DO  Nutritional Supplements (FEEDING SUPPLEMENT, NEPRO CARB STEADY,) LIQD Take 237 mLs by mouth 2 (two) times daily between meals. Patient not taking: Reported on 11/30/2017 01/19/17   Patrecia Pour, Christean Grief, MD  ondansetron (ZOFRAN ODT) 4 MG disintegrating tablet Take 1 tablet (4 mg total) by mouth every 8 (eight) hours as needed for nausea or vomiting. Patient not taking: Reported on 07/10/2018 12/16/17    Kinnie Feil, PA-C  pentoxifylline (TRENTAL) 400 MG CR tablet TAKE 1 TABLET (400 MG TOTAL) BY MOUTH 3 (THREE) TIMES DAILY WITH MEALS. Patient not taking: Reported on 07/10/2018 02/22/18   [name], MD  traMADol (ULTRAM) 50 MG tablet Take 1 tablet (50 mg total) by mouth every 6 (six) hours as needed. Patient not taking: Reported on 07/10/2018 12/16/17   Kinnie Feil, PA-C   Physical Exam: Blood pressure 133/84, pulse 71, temperature (!) 96.7 F (35.9 C), temperature source Oral, resp. rate 19, SpO2 100 %. 1. General:  in No Acute distress   Chronically ill  -appearing 2. Psychological: Alert and   Oriented 3. Head/ENT:    Dry Mucous Membranes                          Head Non traumatic, neck supple                            Poor Dentition 4. SKIN: decreased Skin turgor,  Skin clean Dry and intact slight redness over right lower extremity but no warmth 5. Heart: Regular rate and rhythm no Murmur, no Rub or gallop 6. Lungs:  Clear to auscultation bilaterally, no wheezes or crackles   7. Abdomen: Soft, severe epigastric-tender, Non distended  bowel sounds present,  8. Lower extremities: no clubbing, cyanosis, 1+ edema 9. Neurologically Grossly intact, moving all 3 extremities equally   10. MSK: Normal range of motion   All other LABS:     Recent Labs  Lab 07/26/18 1143 07/31/18 1221  WBC 3.9* 4.7  NEUTROABS  --  3.2  HGB 10.9* 10.5*  HCT 34.7* 33.3*  MCV 98.3 97.7  PLT 112* 124*     Recent Labs  Lab 07/26/18 1143 07/31/18 1221  NA 137 140  K 5.4* 4.6  CL 98 101  CO2 25 26  GLUCOSE 114* 132*  BUN 43* 25*  CREATININE 8.51* 7.41*  CALCIUM 9.8 9.5     Recent Labs  Lab 07/26/18 1143 07/31/18 1221  AST 18 16  ALT 11 11  ALKPHOS 88 88  BILITOT 1.3* 1.1  PROT 6.9 7.1  ALBUMIN 3.4* 3.7       Cultures:    Component Value Date/Time   SDES BLOOD RIGHT ANTECUBITAL 01/02/2018 1421   SPECREQUEST  01/02/2018 1421    BOTTLES DRAWN AEROBIC AND  ANAEROBIC Blood Culture adequate volume   CULT  01/02/2018 1421    NO GROWTH 5 DAYS Performed at [facility], [place] [address] [place], [place] [address]    REPTSTATUS 01/07/2018 FINAL 01/02/2018 1421     Radiological Exams on Admission: US Abdomen Limited Ruq  Result Date: 07/31/2018 CLINICAL DATA:  Upper abdominal pain several months worse today. EXAM: ULTRASOUND ABDOMEN LIMITED RIGHT UPPER QUADRANT COMPARISON:  HIDA scan 07/10/2018 and CT 07/09/2018 FINDINGS: Gallbladder: Moderate cholelithiasis with largest stone measuring 1.4 cm. Positive sonographic Murphy sign with mild adjacent free fluid. Edematous  gallbladder wall thickening measuring 7.2 mm which is nonspecific in the setting of mild ascites. Findings may be due to acute cholecystitis, although note the patient had a negative HIDA scan for acute cholecystitis 07/10/2018. Common bile duct: Diameter: 4.7 mm. Liver: No focal lesion identified. Within normal limits in parenchymal echogenicity. Portal vein is patent on color Doppler imaging with normal direction of blood flow towards the liver. IMPRESSION: Moderate cholelithiasis with positive sonographic Murphy sign. Nonspecific gallbladder wall thickening of 7.2 mm in the setting of mild ascites. Findings may be due to acute cholecystitis. Electronically Signed   By: Marin Olp M.D.   On: 07/31/2018 17:19    Chart has been reviewed    Assessment/Plan  58 y.o. male with medical history significant of anemia, anxiety, ESRD on dialysis, GERD, DM 2,  AKA, chronic abd pain anemia, inguinal hernia , CAD status post CABG, Polymyalgia rheumatica  Admitted for acute calculus cholecystitis by exam and imaging  Present on Admission: . Acute cholecystitis - admit for IV rehydration and pain control, bowel rest, and empiric antibiotics (zosyn)    Appreciate general surgery consult  plan for laparoscopic cholecystectomy prior to discharge If cleared by cardiology   . Essential  hypertension -continue home medications for now . Diabetes mellitus due to underlying condition ([place]) -well controlled on diet only monitoring and cover if sensitive sliding scale  . Hyperlipidemia LDL goal <100 chronic stable . GERD (gastroesophageal reflux disease) chronic stable continue home medications when able to tolerate . End stage renal disease [facility]) discussed with nephrology they are aware patient will need dialysis in a.m. likely . Essential hypertension, benign -chronic continue home medications   . Anemia in chronic kidney disease currently at baseline continue to monitor  . CAD in native artery - stable on Aspirin will hold for posible currently chest pain-free  Will obtain echogram emailed cardiology to help with cardiac clearance prior to the OR as per request of general surgery  Prolonged QT will monitor on telemetry repeat EKG in a.m. Other plan as per orders.  DVT prophylaxis:  SCD    Code Status:  FULL CODE  as per patient   I had personally discussed CODE STATUS with patient    Family Communication:   Family not at  Bedside    Disposition Plan:    To home once workup is complete and patient is stable                      Would benefit from PT/OT eval prior to DC  Ordered                      Consults called: General surgery, emailed cardiology     Admission status:  ED Disposition    ED Disposition Condition [place]: [place] [[id number]]  Level of Care: Telemetry Medical [104]  Covid Evaluation: Asymptomatic Screening Protocol (No Symptoms)  Diagnosis: Biliary colic [161096]  Admitting Physician: Toy Baker [[id number]]  Attending Physician: Toy Baker [[id number]]  Estimated length of stay: 3 - 4 days  Certification:: I certify this patient will need inpatient services for at least 2 midnights  PT Class (Do Not Modify): Inpatient [101]  PT Acc Code (Do Not Modify): Private [1]        inpatient      Expect 2 midnight stay secondary to severity of patient's current illness including      Severe lab/radiological/exam abnormalities  including:     and extensive comorbidities including:  DM2 , CAD, ESRD   . History of amputation .    That are currently affecting medical management.   I expect  patient to be hospitalized for 2 midnights requiring inpatient medical care.  Patient is at high risk for adverse outcome (such as loss of life or disability) if not treated.  Indication for inpatient stay as follows:    severe pain requiring acute inpatient management,  inability to maintain oral hydration    Need for operative/procedural  intervention New or worsening hypoxia  Need for IV antibiotics, IV fluids,   IV pain medications    Level of care   tele  For  24H     Precautions:  NONE   No active isolations  PPE: Used by the provider:   P100  eye Goggles,  Gloves    Kylene Zamarron 07/31/2018, 11:14 PM    [facility]     after 2 AM please page floor coverage PA If 7AM-7PM, please contact the day team taking care of the patient using [email or web address]

## 2018-08-01 ENCOUNTER — Other Ambulatory Visit: Payer: Self-pay

## 2018-08-01 ENCOUNTER — Inpatient Hospital Stay (HOSPITAL_BASED_OUTPATIENT_CLINIC_OR_DEPARTMENT_OTHER): Payer: Medicare HMO

## 2018-08-01 DIAGNOSIS — I10 Essential (primary) hypertension: Secondary | ICD-10-CM | POA: Diagnosis not present

## 2018-08-01 DIAGNOSIS — I361 Nonrheumatic tricuspid (valve) insufficiency: Secondary | ICD-10-CM

## 2018-08-01 DIAGNOSIS — I2510 Atherosclerotic heart disease of native coronary artery without angina pectoris: Secondary | ICD-10-CM

## 2018-08-01 DIAGNOSIS — Z01810 Encounter for preprocedural cardiovascular examination: Secondary | ICD-10-CM | POA: Diagnosis not present

## 2018-08-01 DIAGNOSIS — I340 Nonrheumatic mitral (valve) insufficiency: Secondary | ICD-10-CM | POA: Diagnosis not present

## 2018-08-01 DIAGNOSIS — E785 Hyperlipidemia, unspecified: Secondary | ICD-10-CM | POA: Diagnosis not present

## 2018-08-01 DIAGNOSIS — N186 End stage renal disease: Secondary | ICD-10-CM | POA: Diagnosis not present

## 2018-08-01 DIAGNOSIS — K810 Acute cholecystitis: Secondary | ICD-10-CM | POA: Diagnosis not present

## 2018-08-01 LAB — COMPREHENSIVE METABOLIC PANEL
ALT: 11 U/L (ref 0–44)
AST: 15 U/L (ref 15–41)
Albumin: 3.5 g/dL (ref 3.5–5.0)
Alkaline Phosphatase: 78 U/L (ref 38–126)
Anion gap: 11 (ref 5–15)
BUN: 28 mg/dL — ABNORMAL HIGH (ref 6–20)
CO2: 28 mmol/L (ref 22–32)
Calcium: 9.6 mg/dL (ref 8.9–10.3)
Chloride: 101 mmol/L (ref 98–111)
Creatinine, Ser: 7.73 mg/dL — ABNORMAL HIGH (ref 0.61–1.24)
GFR calc Af Amer: 8 mL/min — ABNORMAL LOW (ref >60)
GFR calc non Af Amer: 7 mL/min — ABNORMAL LOW (ref >60)
Glucose, Bld: 90 mg/dL (ref 70–99)
Potassium: 4.8 mmol/L (ref 3.5–5.1)
Sodium: 140 mmol/L (ref 135–145)
Total Bilirubin: 0.9 mg/dL (ref 0.3–1.2)
Total Protein: 6.9 g/dL (ref 6.5–8.1)

## 2018-08-01 LAB — GLUCOSE, CAPILLARY
Glucose-Capillary: 72 mg/dL (ref 70–99)
Glucose-Capillary: 70 mg/dL (ref 70–99)
Glucose-Capillary: 64 mg/dL — ABNORMAL LOW (ref 70–99)
Glucose-Capillary: 97 mg/dL (ref 70–99)
Glucose-Capillary: 97 mg/dL (ref 70–99)

## 2018-08-01 LAB — CBC
HCT: 32.8 % — ABNORMAL LOW (ref 39.0–52.0)
Hemoglobin: 10.1 g/dL — ABNORMAL LOW (ref 13.0–17.0)
MCH: 30 pg (ref 26.0–34.0)
MCHC: 30.8 g/dL (ref 30.0–36.0)
MCV: 97.3 fL (ref 80.0–100.0)
Platelets: 125 10*3/uL — ABNORMAL LOW (ref 150–400)
RBC: 3.37 MIL/uL — ABNORMAL LOW (ref 4.22–5.81)
RDW: 14.6 % (ref 11.5–15.5)
WBC: 4.1 10*3/uL (ref 4.0–10.5)
nRBC: 0 % (ref 0.0–0.2)

## 2018-08-01 LAB — PHOSPHORUS: Phosphorus: 4.8 mg/dL — ABNORMAL HIGH (ref 2.5–4.6)

## 2018-08-01 LAB — HEMOGLOBIN A1C
Hgb A1c MFr Bld: 5.7 % — ABNORMAL HIGH (ref 4.8–5.6)
Mean Plasma Glucose: 116.89 mg/dL

## 2018-08-01 LAB — SURGICAL PCR SCREEN
MRSA, PCR: NEGATIVE
Staphylococcus aureus: NEGATIVE

## 2018-08-01 LAB — TSH: TSH: 3.359 u[IU]/mL (ref 0.350–4.500)

## 2018-08-01 LAB — MAGNESIUM: Magnesium: 2.2 mg/dL (ref 1.7–2.4)

## 2018-08-01 LAB — LACTIC ACID, PLASMA: Lactic Acid, Venous: 1.2 mmol/L (ref 0.5–1.9)

## 2018-08-01 MED ORDER — CHLORHEXIDINE GLUCONATE CLOTH 2 % EX PADS
6.0000 | MEDICATED_PAD | Freq: Every day | CUTANEOUS | Status: DC
  Administered 2018-08-01: 6 via TOPICAL

## 2018-08-01 MED ORDER — SODIUM CHLORIDE 0.9 % IV SOLN: 100.0000 mL | INTRAVENOUS | Status: DC | PRN

## 2018-08-01 MED ORDER — PENTAFLUOROPROP-TETRAFLUOROETH EX AERO: 1.0000 "application " | INHALATION_SPRAY | CUTANEOUS | Status: DC | PRN

## 2018-08-01 MED ORDER — LORATADINE 10 MG PO TABS
10.0000 mg | ORAL_TABLET | Freq: Every day | ORAL | Status: DC | PRN
  Administered 2018-08-01 – 2018-08-02 (×2): 10 mg via ORAL
  Filled 2018-08-01 (×2): qty 1

## 2018-08-01 MED ORDER — HEPARIN SODIUM (PORCINE) 1000 UNIT/ML DIALYSIS: 1000.0000 [IU] | INTRAMUSCULAR | Status: DC | PRN

## 2018-08-01 MED ORDER — LIDOCAINE HCL (PF) 1 % IJ SOLN: 5.0000 mL | INTRAMUSCULAR | Status: DC | PRN

## 2018-08-01 MED ORDER — HEPARIN SODIUM (PORCINE) 1000 UNIT/ML DIALYSIS: 4000.0000 [IU] | INTRAMUSCULAR | Status: DC | PRN

## 2018-08-01 MED ORDER — CHLORHEXIDINE GLUCONATE CLOTH 2 % EX PADS: 6.0000 | MEDICATED_PAD | Freq: Every day | CUTANEOUS | Status: DC

## 2018-08-01 MED ORDER — ALTEPLASE 2 MG IJ SOLR: 2.0000 mg | Freq: Once | INTRAMUSCULAR | Status: DC | PRN

## 2018-08-01 MED ORDER — LIDOCAINE-PRILOCAINE 2.5-2.5 % EX CREA: 1.0000 "application " | TOPICAL_CREAM | CUTANEOUS | Status: DC | PRN

## 2018-08-01 NOTE — Progress Notes (Signed)
Occupational Therapy Evaluation Patient Details Name: Matthew Kline MRN: [id number] DOB: 07/27/1960 Today's Date: 08/01/2018    History of Present Illness Matthew Kline is a 58 y.o. male. ESRD thought to be 2/2 DM on HD MWF at [facility], first starting in 12/2012.  Past medical history significant for DM, HTN, h/o lacunar stroke, CHF, CAD s/p CABG x3 in 5/208, PAD with multiple toe amputations & s/p L AKA and PMR with chronic pain. Admitted with N/Matthew Kline is a 58 y.o. male. ESRD thought to be 2/2 DM on HD MWF at [facility], first starting in 12/2012.  Past medical history significant for DM, HTN, h/o lacunar stroke, CHF, CAD s/p CABG x3 in 5/208, PAD with multiple toe amputations & s/p L AKA and PMR with chronic pain.Admitted with nausea, abdominal pain and found to have acute cholecystitis; plan for cholecystectomy on 7/22   Clinical Impression   Pt presents with above diagnosis. PTA pt PLOF Mod I with increased time and use of AE in home setting and family support. Pt currently demonstrate Mod I in dressing at bed level, Min A for safety functional transfers, and Max A toilet hygiene due to decreased standing balance of AKA. Pt will benefit from continued therapy to address strength, and safety ADL engagement prior to dc to [place].    Follow Up Recommendations  Home health OT;Supervision - Intermittent    Equipment Recommendations       Recommendations for Other Services       Precautions / Restrictions Precautions Precautions: Fall Precaution Comments: Fall risk reduced with use of assistive device and prosthesis Required Braces or Orthoses: Other Brace Other Brace: Prosthesis for LLE; Mr. [name] reports having lost a lot of weight recently, resulting in a poor fit of the prosthesis, and pain, especially with the bil inguinal hernias, when he walks with the prosthesis Restrictions Weight Bearing Restrictions: No      Mobility Bed Mobility Overal bed mobility: Modified  Independent             General bed mobility comments: pt required increased time to sit towards EOB. No physical assistance needed  Transfers Overall transfer level: Needs assistance Equipment used: Rolling walker (2 wheeled) Transfers: Stand Pivot Transfers   Stand pivot transfers: Min assist       General transfer comment: Min A for safety.    Balance                                           ADL either performed or assessed with clinical judgement   ADL Overall ADL's : Modified independent                         Toilet Transfer: Minimal assistance Toilet Transfer Details (indicate cue type and reason): simulated toilet transfer from bed to recliner. No physical assistance required. Toileting- Clothing Manipulation and Hygiene: Maximal assistance Toileting - Clothing Manipulation Details (indicate cue type and reason): Max A required for toilet hygiene due to limited standing balance secondary to L AKA.       General ADL Comments: Mod I for majority of ADLs. Min A required for functional transfers for safety with RW.      Vision         Perception     Praxis      Pertinent Vitals/Pain Pain Assessment: Faces Faces Pain  Scale: Hurts a little bit Pain Location: (pt observed to scratch upon start of session. ) Pain Descriptors / Indicators: Burning Pain Intervention(s): Monitored during session;Repositioned     Hand Dominance     Extremity/Trunk Assessment Upper Extremity Assessment Upper Extremity Assessment: Overall WFL for tasks assessed   Lower Extremity Assessment Lower Extremity Assessment: Defer to PT evaluation       Communication Communication Communication: No difficulties   Cognition Arousal/Alertness: Awake/alert Behavior During Therapy: WFL for tasks assessed/performed Overall Cognitive Status: Within Functional Limits for tasks assessed                                     General  Comments  Pt pleasant and agreeable to session. Reports prosthesis iritates his stump.     Exercises     Shoulder Instructions      Home Living Family/patient expects to be discharged to:: Private residence Living Arrangements: Children Available Help at Discharge: Family;Available 24 hours/day Type of Home: House             Bathroom Shower/Tub: Tub/shower unit         Home Equipment: Environmental consultant - 2 wheels;Shower seat(forearem crutches.)   Additional Comments: Has prosthesis.       Prior Functioning/Environment Level of Independence: Independent with assistive device(s)                 OT Problem List: Decreased strength;Decreased range of motion;Decreased activity tolerance;Impaired balance (sitting and/or standing);Decreased safety awareness;Decreased knowledge of use of DME or AE;Decreased knowledge of precautions      OT Treatment/Interventions: Self-care/ADL training;Therapeutic exercise;Therapeutic activities;Patient/family education;Balance training    OT Goals(Current goals can be found in the care plan section) Acute Rehab OT Goals Patient Stated Goal: (to return home) OT Goal Formulation: With patient Time For Goal Achievement: 08/15/18 Potential to Achieve Goals: Fair  OT Frequency: Min 2X/week   Barriers to D/C:            Co-evaluation              AM-PAC OT "6 Clicks" Daily Activity     Outcome Measure Help from another person eating meals?: None Help from another person taking care of personal grooming?: A Little Help from another person toileting, which includes using toliet, bedpan, or urinal?: A Lot Help from another person bathing (including washing, rinsing, drying)?: A Little Help from another person to put on and taking off regular upper body clothing?: A Little Help from another person to put on and taking off regular lower body clothing?: A Little 6 Click Score: 18   End of Session Equipment Utilized During Treatment: Gait  belt;Rolling walker Nurse Communication: Mobility status  Activity Tolerance: Patient tolerated treatment well Patient left: in chair;with call bell/phone within reach;with nursing/sitter in room  OT Visit Diagnosis: Unsteadiness on feet (R26.81);Pain;Muscle weakness (generalized) (M62.81)                Time: 4034-7425 OT Time Calculation (min): 30 min Charges:  OT General Charges $OT Visit: 1 Visit OT Evaluation $OT Eval Low Complexity: [place], MSOT, OTR/L  Supplemental Rehabilitation Services  [phone]   Marius Ditch 08/01/2018, 4:14 PM

## 2018-08-01 NOTE — Progress Notes (Signed)
OT Cancellation Note  Patient Details Name: Nesanel Aguila MRN: [id number] DOB: 25-Dec-1960   Cancelled Treatment:      Reason Eval/Treat Not Completed: Patient at procedure or test/unavailable  Currently in HD. Will follow up later today as time allows or will follow up for OT tomorrow;   Thank you,   Minus Breeding, MSOT, OTR/L  Supplemental Rehabilitation Services  [phone]   Marius Ditch 08/01/2018, 11:17 AM

## 2018-08-01 NOTE — Evaluation (Signed)
Physical Therapy Evaluation Patient Details Name: Martha Soltys MRN: [id number] DOB: 01/19/1960 Today's Date: 08/01/2018   History of Present Illness  Garett Tetzloff is a 58 y.o. male. ESRD thought to be 2/2 DM on HD MWF at [facility], first starting in 12/2012.  Past medical history significant for DM, HTN, h/o lacunar stroke, CHF, CAD s/p CABG x3 in 5/208, PAD with multiple toe amputations & s/p L AKA and PMR with chronic pain. Admitted with N/Khiem Mcewan is a 58 y.o. male. ESRD thought to be 2/2 DM on HD MWF at [facility], first starting in 12/2012.  Past medical history significant for DM, HTN, h/o lacunar stroke, CHF, CAD s/p CABG x3 in 5/208, PAD with multiple toe amputations & s/p L AKA and PMR with chronic pain.Admitted with nausea, abdominal pain and found to have acute cholecystitis; plan for cholecystectomy on 7/22  Clinical Impression   Pt admitted with above diagnosis. Pt currently with functional limitations due to the deficits listed below (see PT Problem List). Typically he manages independently with prosthesis and Assistive devices prn; Became weaker, lost weight, improper prosthesis fit in the weeks leading up to this admission making walking more difficult; Presents to PT with decr functional mobility, poorly fitting prosthesis effecting walking and general mobility;  Pt will benefit from skilled PT to increase their independence and safety with mobility to allow discharge to the venue listed below.    Noted plan for inguinal hernias to be addressed as elective surgeries at some point; I believe it will benefit Mr. Cooks to address his hernias sooner than later as they effect his prosthesis fit, and that has far-reacing effects on his daily mobility and ADLs.     Follow Up Recommendations Home health PT;Supervision - Intermittent    Equipment Recommendations  Rolling walker with 5" wheels(if he doesn't already have one)    Recommendations for Other Services        Precautions / Restrictions Precautions Precautions: Fall Precaution Comments: Fall risk reduced with use of assistive device and prosthesis Required Braces or Orthoses: Other Brace Other Brace: Prosthesis for LLE; Mr. Mclaren reports having lost a lot of weight recently, resulting in a poor fit of the prosthesis, and pain, especially with the bil inguinal hernias, when he walks with the prosthesis Restrictions Weight Bearing Restrictions: No      Mobility  Bed Mobility Overal bed mobility: Modified Independent             General bed mobility comments: pt required increased time to sit towards EOB. No physical assistance needed  Transfers Overall transfer level: Needs assistance Equipment used: Rolling walker (2 wheeled) Transfers: Sit to/from [facility] Sit to Stand: Min assist Stand pivot transfers: Min assist       General transfer comment: Min A for safety and to steady RW; cues for hand placement  Ambulation/Gait                Stairs            Wheelchair Mobility    Modified Rankin (Stroke Patients Only)       Balance                                             Pertinent Vitals/Pain Pain Assessment: Faces Faces Pain Scale: Hurts a little bit Pain Location: Very itchy Pain Descriptors / Indicators: Burning(Itching)  Pain Intervention(s): Monitored during session;Other (comment)(RN aware of request for anti-itch meds)    Home Living Family/patient expects to be discharged to:: Private residence Living Arrangements: Children Available Help at Discharge: Family;Available 24 hours/day Type of Home: House Home Access: (to be determined)     Home Layout: One level Home Equipment: Walker - 2 wheels;Shower seat;Wheelchair - manual(forearem crutches.) Additional Comments: Has prosthesis.     Prior Function Level of Independence: Independent with assistive device(s)         Comments: Mr. Sterkel  indicated he is normally independent with prosthesis and crutches prn, however with recent N/V due to gall bladder (since March or so), with weight loss, improper prosthesis fit (even more painful with bil inguinal hernias)      Hand Dominance        Extremity/Trunk Assessment   Upper Extremity Assessment Upper Extremity Assessment: Defer to OT evaluation    Lower Extremity Assessment Lower Extremity Assessment: Generalized weakness(L AKA)       Communication   Communication: No difficulties  Cognition Arousal/Alertness: Awake/alert Behavior During Therapy: WFL for tasks assessed/performed Overall Cognitive Status: Within Functional Limits for tasks assessed                                        General Comments General comments (skin integrity, edema, etc.): Pt pleasant and agreeable to session. Reports prosthesis iritates his stump.     Exercises     Assessment/Plan    PT Assessment Patient needs continued PT services  PT Problem List Decreased strength;Decreased range of motion;Decreased activity tolerance;Decreased balance;Decreased mobility;Pain;Decreased skin integrity       PT Treatment Interventions DME instruction;Gait training;Stair training;Functional mobility training;Therapeutic activities;Therapeutic exercise;Balance training;Patient/family education    PT Goals (Current goals can be found in the Care Plan section)  Acute Rehab PT Goals Patient Stated Goal: to return home PT Goal Formulation: With patient Time For Goal Achievement: 08/15/18 Potential to Achieve Goals: Good    Frequency Min 3X/week   Barriers to discharge        Co-evaluation               AM-PAC PT "6 Clicks" Mobility  Outcome Measure Help needed turning from your back to your side while in a flat bed without using bedrails?: None Help needed moving from lying on your back to sitting on the side of a flat bed without using bedrails?: None Help needed  moving to and from a bed to a chair (including a wheelchair)?: A Little Help needed standing up from a chair using your arms (e.g., wheelchair or bedside chair)?: A Little Help needed to walk in hospital room?: A Little Help needed climbing 3-5 steps with a railing? : A Lot 6 Click Score: 19    End of Session Equipment Utilized During Treatment: Gait belt Activity Tolerance: Patient tolerated treatment well Patient left: in chair;with call bell/phone within reach Nurse Communication: Mobility status PT Visit Diagnosis: Other abnormalities of gait and mobility (R26.89)    Time: 1350-1420 PT Time Calculation (min) (ACUTE ONLY): 30 min   Charges:   PT Evaluation $PT Eval Moderate Complexity: 1 Mod          Roney Marion, PT  Acute Rehabilitation Services Pager [phone] Office [place] 08/01/2018, 4:43 PM

## 2018-08-01 NOTE — Plan of Care (Signed)
  Problem: Education: Goal: Knowledge of General Education information will improve Description Including pain rating scale, medication(s)/side effects and non-pharmacologic comfort measures Outcome: Progressing   

## 2018-08-01 NOTE — Consult Note (Signed)
Cardiology Consultation:   [name] MRN: [id number]; DOB: 13-Nov-1960  Admit date: 07/31/2018 Date of Consult: 08/01/2018  Primary Care Provider: [facility] [place] Primary Cardiologist: [facility]. Dr. Fletcher Anon for PAD but has not been seen since 2018. Primary Electrophysiologist:  None    Patient Profile:   Matthew Kline is a 58 y.o. male with a history of CAD s/p CABG in 2018 at [facility], PAD s/p bifurcation angioplasty of the anterior tibial artery with a TP trunk in 08/2016 and previous multiple left digit amputations, ESRD on hemodialysis, hypertension, type 2 diabetes mellitus with peripheral neuropathy, CVA, and tobacco use, who is being seen today for pre-operative evaluation at the request of Dr. Georgette Dover (Surgery).  History of Present Illness:   [name] is a 58 year old male with the above history who reportedly is followed by Cardiologist at [facility] (no records available at this time). He was previously seen by Dr. Fletcher Anon for his PAD but has not seen him since 08/2016. Pt has a history of CAD, HTN, CVA, perp neuropathy, HTN, tobacco abuse and ESRD  Patient was recently admitted from 07/09/2018 to 71/2020 for abdominal pain with decreased appetite and nausea.  Patient presented to the ED yesterday from dialysis for further evaluation of abdominal pain. CT showed non-obstructive inguinal hernia, moderate ascites, and gallstones as well as small bilateral pleural effusions and body wall edema consistent with 3rd spacing. HIDA scan revealed patent cystic duct and mildly low gallbladder EF. Pain was felt to be secondary to volume overload as a result of non-compliance with hemodialysis. He was dialyzed and pain improved. General surgery was consulted during admission and it was felt like no surgical intervention for bilateral inguinal hernias was needed at the time. Patient was advised to follow-up with primary surgeon at [facility].  He reportedly already had "cardiology clearance" from primary Cardiologist at [facility].  Patient returned to the ED yesterday from dialysis for evaluation of mid upper abdominal pain radiating to his back that reportedly became unbearable on day of presentation. Pain was so severe that patient had to cut his dialysis session short after about 15 minutes. RUQ ultrasound showed moderate cholelithiasis with positive sonographic Murphy sign. Surgery was consulted and recommended admission for optimization of comorbidities, completion of dialysis session, and ultimately possible laparoscopic cholecystectomy. Cardiology was consulted for cardiac clearance.   Pt has hx of CAD  As part of work up for renal transplant he was found to have significant 3V CAD   He underwent 3V CABG in 05/2016 (SVG to RCA; SVG to ramus; LIMA to LAD)     LVEF noted to be normal    The pt says he never had CP prior    May note some more energy after but did not have signif SOB     Since surgery he continues with dialysis   Denies CP   Breathing is OK   No edema    Heart Pathway Score:     Past Medical History:  Diagnosis Date   Anemia, unspecified    Anxiety    Arthralgia 2010   polyarticular   Arthritis    "back, knees" (01/10/2017)   Cancer ([place])    "kidney area" (01/10/2017)   CHF (congestive heart failure) ([place]) 07/25/2009   denies   Chronic lower back pain    Coronary artery disease    Coughing    pt. reports that he has drainage from sinus infection   Diabetic  foot ulcer ([place])    Diabetic neuropathy ([place])    ESRD (end stage renal disease) on dialysis ([place])    started 12/2012; "MWF; [facility] "  (01/10/2017)   GERD (gastroesophageal reflux disease)    hx "before I lost weight", no problem 9 years   Hemodialysis access site with mature fistula ([place])    Hemorrhoids, internal 10/2011   small   High cholesterol    History of blood transfusion    "related to the anemia"    Hypertension    Insomnia, unspecified    Lacunar infarction ([place]) 2006   RUE/RLE, speech   Long term (current) use of anticoagulants    Myocardial infarction ([place]) 1995   Orthostatic hypotension    Osteomyelitis of foot, left, acute ([place])    Other chronic postoperative pain    Pneumonia    "probably twice" (01/10/2017)   Polymyalgia rheumatica ([place])    Renal insufficiency    Sleep apnea    "lost weight; no more problem" (01/10/2017)   Stroke ([place]) 01/10/06   denies residual on 05/09/2014   Type II diabetes mellitus ([place]) dx'd 1995   Unspecified hereditary and idiopathic peripheral neuropathy    feet   Unspecified osteomyelitis, site unspecified    Unspecified vitamin D deficiency     Past Surgical History:  Procedure Laterality Date   ABDOMINAL AORTOGRAM N/A 08/25/2016   Procedure: ABDOMINAL AORTOGRAM;  Surgeon: Wellington Hampshire, MD;  Location: [place] [facility];  Service: Cardiovascular;  Laterality: N/A;   AMPUTATION  01/21/2012   Procedure: AMPUTATION RAY;  Surgeon: [name], MD;  Location: [place];  Service: Orthopedics;  Laterality: Left;  Left Foot 4th Ray Amputation   AMPUTATION Left 05/04/2013   Procedure: AMPUTATION DIGIT;  Surgeon: [name], MD;  Location: [place];  Service: Orthopedics;  Laterality: Left;  Left Great Toe Amputation at [facility]   AMPUTATION Left 01/14/2017   Procedure: AMPUTATION ABOVE LEFT KNEE;  Surgeon: [name], MD;  Location: [place];  Service: Orthopedics;  Laterality: Left;   ANTERIOR CERVICAL DECOMP/DISCECTOMY FUSION  02/2011   BACK SURGERY     BASCILIC VEIN TRANSPOSITION Left 10/19/2012   Procedure: BASCILIC VEIN TRANSPOSITION;  Surgeon: Serafina Mitchell, MD;  Location: [place];  Service: Vascular;  Laterality: Left;   CARDIAC CATHETERIZATION     "before bypass"   CORONARY ARTERY BYPASS GRAFT     x 5 with lima at [place] Left 08/07/2014   Procedure:  Replace Left Total Knee Arthroplasty,  Place Antibiotic Spacer;  Surgeon: [name], MD;  Location: [place];  Service: Orthopedics;  Laterality: Left;   I&D EXTREMITY Left 05/09/2014   Procedure: Irrigation and Debridement Left Knee and Closure of Total Knee Arthroplasty Incision;  Surgeon: [name], MD;  Location: [place];  Service: Orthopedics;  Laterality: Left;   I&D KNEE WITH POLY EXCHANGE Left 05/31/2014   Procedure: IRRIGATION AND DEBRIDEMENT LEFT KNEE, PLACE ANTIBIOTIC BEADS,  POLY EXCHANGE;  Surgeon: [name], MD;  Location: [place];  Service: Orthopedics;  Laterality: Left;   IRRIGATION AND DEBRIDEMENT KNEE Left 01/12/2017   Procedure: IRRIGATION AND DEBRIDEMENT LEFT KNEE;  Surgeon: [name], MD;  Location: [place];  Service: Orthopedics;  Laterality: Left;   JOINT REPLACEMENT     KNEE ARTHROSCOPY Left 08-25-2012   LOWER EXTREMITY ANGIOGRAPHY Left 08/25/2016   Procedure: Lower Extremity Angiography;  Surgeon: Kathlyn Sacramento  A, MD;  Location: [place] [facility];  Service: Cardiovascular;  Laterality: Left;   PERIPHERAL VASCULAR BALLOON ANGIOPLASTY Left 08/25/2016   Procedure: PERIPHERAL VASCULAR BALLOON ANGIOPLASTY;  Surgeon: Wellington Hampshire, MD;  Location: [place] [facility];  Service: Cardiovascular;  Laterality: Left;  lt peroneal and ant tibial arteries cutting balloon   REFRACTIVE SURGERY Bilateral    TOE AMPUTATION Bilateral    "I've lost 7 toes over the last 7 years" (05/09/2014)   TOE SURGERY Left April 2015   Big toe removed on left foot.   TONSILLECTOMY     TOTAL KNEE ARTHROPLASTY Left 04/10/2014   Procedure: TOTAL KNEE ARTHROPLASTY;  Surgeon: [name], MD;  Location: [place];  Service: Orthopedics;  Laterality: Left;   TOTAL KNEE REVISION Left 10/25/2014   Procedure: LEFT TOTAL KNEE REVISION;  Surgeon: [name], MD;  Location: [place];  Service: Orthopedics;  Laterality: Left;   TOTAL KNEE REVISION Left 11/26/2015   Procedure: Removal Left Total  Knee Arthroplasty, Hinged Total Knee Arthroplasty;  Surgeon: [name], MD;  Location: [place];  Service: Orthopedics;  Laterality: Left;   UVULOPALATOPHARYNGOPLASTY, TONSILLECTOMY AND SEPTOPLASTY  ~ 1989   WOUND DEBRIDEMENT Left 05/09/2014   Dehiscence Left Total Knee Arthroplasty Incision       Inpatient Medications: Scheduled Meds:  amLODipine  5 mg Oral Daily   carvedilol  12.5 mg Oral BID WC   Chlorhexidine Gluconate Cloth  6 each Topical Q0600   insulin aspart  0-9 Units Subcutaneous Q4H   sodium chloride flush  3 mL Intravenous Q12H   Continuous Infusions:  sodium chloride     sodium chloride     sodium chloride     piperacillin-tazobactam (ZOSYN)  IV 3.375 g (07/31/18 2346)   PRN Meds: sodium chloride, sodium chloride, sodium chloride, acetaminophen **OR** acetaminophen, alteplase, heparin, heparin, lidocaine (PF), lidocaine-prilocaine, pentafluoroprop-tetrafluoroeth, sodium chloride flush, temazepam  Allergies:    Allergies  Allergen Reactions   Morphine And Related Other (See Comments)    hallucinations   Tygacil [Tigecycline] Nausea And Vomiting and Other (See Comments)    Makes him feel crazy   Imodium [Loperamide] Itching    Social History:   Social History   Socioeconomic History   Marital status: Legally Separated    Spouse name: Not on file   Number of children: Not on file   Years of education: Not on file   Highest education level: Not on file  Occupational History   Not on file  Social Needs   Financial resource strain: Not on file   Food insecurity    Worry: Not on file    Inability: Not on file   Transportation needs    Medical: Not on file    Non-medical: Not on file  Tobacco Use   Smoking status: Former Smoker    Packs/day: 0.12    Years: 32.00    Pack years: 3.84    Types: Cigarettes    Quit date: 10/05/2016    Years since quitting: 1.8   Smokeless tobacco: Never Used  Substance and Sexual Activity    Alcohol use: No    Alcohol/week: 0.0 standard drinks   Drug use: No   Sexual activity: Not Currently  Lifestyle   Physical activity    Days per week: Not on file    Minutes per session: Not on file   Stress: Not on file  Relationships   Social connections    Talks on phone: Not on  file    Gets together: Not on file    Attends religious service: Not on file    Active member of club or organization: Not on file    Attends meetings of clubs or organizations: Not on file    Relationship status: Not on file   Intimate partner violence    Fear of current or ex partner: Not on file    Emotionally abused: Not on file    Physically abused: Not on file    Forced sexual activity: Not on file  Other Topics Concern   Not on file  Social History Narrative   Not on file    Family History:    Family History  Problem Relation Age of Onset   Hypertension Mother    Cancer Mother 30       Ovarian   Heart disease Maternal Aunt    Stroke Maternal Grandfather      ROS:  Please see the history of present illness.  Has some numbness in R calf   All other ROS reviewed and negative.     Physical Exam/Data:   Vitals:   08/01/18 0402 08/01/18 0849 08/01/18 0854 08/01/18 0900  BP: 132/88 135/83 123/81 123/81  Pulse: 67 70 70 70  Resp: 18 19 15 18   Temp: 98.2 F (36.8 C) 98 F (36.7 C)    TempSrc:  Oral    SpO2: 97%     Weight:  74.7 kg    Height:       No intake or output data in the 24 hours ending 08/01/18 0944 Last 3 Weights 08/01/2018 07/31/2018 07/26/2018  Weight (lbs) 164 lb 10.9 oz 161 lb 2.5 oz 166 lb  Weight (kg) 74.7 kg 73.1 kg 75.297 kg     Body mass index is 22.97 kg/m.     General:  Thinn 58 yo in no acute distress. HEENT: Normal. Lymph: No adenopathy. Neck: Supple. No JVD. Endocrine:  No thyromegaly. Vascular: No carotid bruits.  Cardiac: RRR. Distinct S1 and S2. No murmurs, gallops, or rubs.  Lungs: Clear to auscultation bilaterally. No wheezing,  rhonchi, or rales. Abd: Soft, non-distended  Mild RUQ tenderness Bowel sounds present. Ext: No edema.  S/p L BKA Musculoskeletal: No deformities. BUE and BLE strength normal and equal. Skin: Warm and dry. Neuro: Alert and oriented x3. No focal deficits. Psych:  Normal affect.   EKG:  The EKG was personally reviewed and demonstrates:  SR 65 bpm   Nonspecific ST T wave changes    Telemetry:  Telemetry was personally reviewed and demonstrates:  SR  Relevant CV Studies:  Echocardiogram 02/08/2017: Study Conclusions: - Left ventricle: The cavity size was normal. There was mild   concentric hypertrophy. Systolic function was normal. The   estimated ejection fraction was in the range of 50% to 55%. Wall   motion was normal; there were no regional wall motion   abnormalities. - Aortic valve: Transvalvular velocity was within the normal range.   There was no stenosis. There was no regurgitation. - Mitral valve: Transvalvular velocity was within the normal range.   There was no evidence for stenosis. There was trivial   regurgitation. - Left atrium: The atrium was severely dilated. - Right ventricle: The cavity size was normal. Wall thickness was   normal. Systolic function was normal. - Right atrium: The atrium was mildly dilated. - Tricuspid valve: There was trivial regurgitation. - Pulmonary arteries: Systolic pressure was within the normal   range. PA peak pressure: 35  mm Hg (S).E  Laboratory Data:  High Sensitivity Troponin:   Recent Labs  Lab 07/10/18 0455 07/10/18 1851  TROPONINIHS 17 17     Cardiac EnzymesNo results for input(s): TROPONINI in the last 168 hours. No results for input(s): TROPIPOC in the last 168 hours.  Chemistry Recent Labs  Lab 07/26/18 1143 07/31/18 1221 08/01/18 0019  NA 137 140 140  K 5.4* 4.6 4.8  CL 98 101 101  CO2 25 26 28   GLUCOSE 114* 132* 90  BUN 43* 25* 28*  CREATININE 8.51* 7.41* 7.73*  CALCIUM 9.8 9.5 9.6  GFRNONAA 6* 7* 7*  GFRAA  7* 8* 8*  ANIONGAP 14 13 11     Recent Labs  Lab 07/26/18 1143 07/31/18 1221 08/01/18 0019  PROT 6.9 7.1 6.9  ALBUMIN 3.4* 3.7 3.5  AST 18 16 15   ALT 11 11 11   ALKPHOS 88 88 78  BILITOT 1.3* 1.1 0.9   Hematology Recent Labs  Lab 07/26/18 1143 07/31/18 1221 08/01/18 0019  WBC 3.9* 4.7 4.1  RBC 3.53* 3.41* 3.37*  HGB 10.9* 10.5* 10.1*  HCT 34.7* 33.3* 32.8*  MCV 98.3 97.7 97.3  MCH 30.9 30.8 30.0  MCHC 31.4 31.5 30.8  RDW 14.8 14.7 14.6  PLT 112* 124* 125*   BNPNo results for input(s): BNP, PROBNP in the last 168 hours.  DDimer No results for input(s): DDIMER in the last 168 hours.   Radiology/Studies:  US Abdomen Limited Ruq  Result Date: 07/31/2018 CLINICAL DATA:  Upper abdominal pain several months worse today. EXAM: ULTRASOUND ABDOMEN LIMITED RIGHT UPPER QUADRANT COMPARISON:  HIDA scan 07/10/2018 and CT 07/09/2018 FINDINGS: Gallbladder: Moderate cholelithiasis with largest stone measuring 1.4 cm. Positive sonographic Murphy sign with mild adjacent free fluid. Edematous gallbladder wall thickening measuring 7.2 mm which is nonspecific in the setting of mild ascites. Findings may be due to acute cholecystitis, although note the patient had a negative HIDA scan for acute cholecystitis 07/10/2018. Common bile duct: Diameter: 4.7 mm. Liver: No focal lesion identified. Within normal limits in parenchymal echogenicity. Portal vein is patent on color Doppler imaging with normal direction of blood flow towards the liver. IMPRESSION: Moderate cholelithiasis with positive sonographic Murphy sign. Nonspecific gallbladder wall thickening of 7.2 mm in the setting of mild ascites. Findings may be due to acute cholecystitis. Electronically Signed   By: Marin Olp M.D.   On: 07/31/2018 17:19    Assessment and Plan:   Preoperative Evaluation - Patient presented with abdominal pain and found to have acute cholecystitis. Cardiology consulted for pre-operative clearance for laparoscopic  cholecystectomy. - Most recent Echo in 01/2017 showed LVEF of 50-55% with no regional wall motion abnormalities.  Pt with revascularlization 2 years ago   No symtpoms to sugg active angina.   He is in SR    He is undergoing HD   No SOB or CHF on exam  From a cardiac standpoint he is a low to mod risk for major cardiac event with plan surgery.   WOuld follow hemodynamics closely in periop period  Watch BP I think he is acceptable to proceed without further heart testing  CAD s/p CABG in 2018 \  See abkve   - On Aspirin 81mg  daily and Coreg 12.5mg  twice daily at home. Continue Coreg. Aspirin currently on held due to anticipated surgery. Restart when able. -   PAD - Patient s/p bifurcation angioplasty of the anterior tibial artery with a TP trunk in 08/2016. Followed by Dr. Fletcher Anon but has not  been seen since 2018.  Hypertension - Continue home Amlodipine and Coreg.  HL   Was on lipitor 40 in past    Not on now   No hx of allergy.   I would resume      Type 2 Diabetes Mellitus - Diet controlled at home. - Management per primary team.  ESRD on Hemodialysis - Receiving hemodialysis today. - Management per primary team.  Acute Cholecystitis - RUQ ultrasound showed moderate cholelithiasis with positive sonographic Murphy sign. .  For questions or updates, please contact [place] Please consult [email or web address] for contact info under     Signed, Darreld Mclean, PA-C  08/01/2018 9:44 AM

## 2018-08-01 NOTE — Progress Notes (Signed)
  Echocardiogram 2D Echocardiogram has been performed.  Jannett Celestine 08/01/2018, 3:31 PM

## 2018-08-01 NOTE — Progress Notes (Signed)
[name], male   DOB: Aug 21, 1960, 58 y.o.   MRN: [id number]  PROGRESS NOTE    Matthew Kline  HYQ:[id number] DOB: 12/22/1960 DOA: 07/31/2018 PCP: [facility]   Brief Narrative:  58 year old male with history of anemia, anxiety, end-stage renal disease on dialysis, GERD, diabetes mellitus type 2, left AKA, chronic abdominal pain, inguinal hernia, CAD status post CABG, polymyalgia rheumatica presented on 07/31/2018 with abdominal pain.  He was found to have acute cholecystitis.  General surgery was consulted.  He was started on IV antibiotics.  Assessment & Plan:   Acute calculus cholecystitis -General surgery following.  Plan for cholecystectomy once cleared by cardiology.  Continue Zosyn.  Follow cultures.  Diet as per general surgery. -Continue pain management  End-stage renal disease on hemodialysis -Nephrology/Dr. Jonnie Finner notified.  Dialysis as per nephrology schedule  CAD with history of CABG -Cardiology notified for cardiology clearance.  Echo pending.  Aspirin on hold.  Continue Coreg.  Diabetes mellitus type 2, diet controlled -Hemoglobin A1c 5.7.  Continue CBGs with SSI.  Hypertension -Monitor blood pressure.  Continue Coreg and amlodipine.  Anemia of chronic disease  -Hemoglobin stable.  Monitor  Thrombocytopenia -Questionable cause.  Stable.  No signs of bleeding  Inguinal hernias -We will need elective repair as per general surgery.  DVT prophylaxis: We will start heparin subcutaneous as cleared by general surgery  code Status: Full Family Communication: Spoke to patient Disposition Plan: Depends on clinical outcome  Consultants: General surgery/cardiology/nephrology Procedures: None  Antimicrobials: Zosyn from 07/31/2018 onwards   Subjective: Patient seen and examined at bedside.  Complains of mild right upper quadrant pain.  No overnight fever or vomiting reported.  Objective: Vitals:   08/01/18 0900 08/01/18 0930  08/01/18 1000 08/01/18 1030  BP: 123/81 122/73 (!) 96/56 124/76  Pulse: 70 65 63 64  Resp: 18 12 13 14   Temp:      TempSrc:      SpO2:      Weight:      Height:       No intake or output data in the 24 hours ending 08/01/18 1118 Filed Weights   07/31/18 2301 08/01/18 0849  Weight: 73.1 kg 74.7 kg    Examination:  General exam: Appears calm and comfortable.  Looks older than stated age. Respiratory system: Bilateral decreased breath sounds at bases Cardiovascular system: S1 & S2 heard, Rate controlled Gastrointestinal system: Abdomen is nondistended, soft and mildly tender in the right upper quadrant. Normal bowel sounds heard. Extremities: No cyanosis, clubbing; trace edema.  Left AKA present. Central nervous system: Alert and oriented. No focal neurological deficits. Moving extremities Skin: No rashes, lesions or ulcers Psychiatry: Judgement and insight appear normal. Mood & affect appropriate.     Data Reviewed: I have personally reviewed following labs and imaging studies  CBC: Recent Labs  Lab 07/26/18 1143 07/31/18 1221 08/01/18 0019  WBC 3.9* 4.7 4.1  NEUTROABS  --  3.2  --   HGB 10.9* 10.5* 10.1*  HCT 34.7* 33.3* 32.8*  MCV 98.3 97.7 97.3  PLT 112* 124* 952*   Basic Metabolic Panel: Recent Labs  Lab 07/26/18 1143 07/31/18 1221 08/01/18 0019  NA 137 140 140  K 5.4* 4.6 4.8  CL 98 101 101  CO2 25 26 28   GLUCOSE 114* 132* 90  BUN 43* 25* 28*  CREATININE 8.51* 7.41* 7.73*  CALCIUM 9.8 9.5 9.6  MG  --   --  2.2  PHOS  --   --  4.8*   GFR: Estimated Creatinine Clearance: 10.9 mL/min (A) (by C-G formula based on SCr of 7.73 mg/dL (H)). Liver Function Tests: Recent Labs  Lab 07/26/18 1143 07/31/18 1221 08/01/18 0019  AST 18 16 15   ALT 11 11 11   ALKPHOS 88 88 78  BILITOT 1.3* 1.1 0.9  PROT 6.9 7.1 6.9  ALBUMIN 3.4* 3.7 3.5   Recent Labs  Lab 07/26/18 1143 07/31/18 1221  LIPASE 23 26   No results for input(s): AMMONIA in the last 168  hours. Coagulation Profile: No results for input(s): INR, PROTIME in the last 168 hours. Cardiac Enzymes: No results for input(s): CKTOTAL, CKMB, CKMBINDEX, TROPONINI in the last 168 hours. BNP (last 3 results) No results for input(s): PROBNP in the last 8760 hours. HbA1C: Recent Labs    08/01/18 0019  HGBA1C 5.7*   CBG: Recent Labs  Lab 07/31/18 2301 08/01/18 0402 08/01/18 0757  GLUCAP 95 72 70   Lipid Profile: No results for input(s): CHOL, HDL, LDLCALC, TRIG, CHOLHDL, LDLDIRECT in the last 72 hours. Thyroid Function Tests: Recent Labs    08/01/18 0019  TSH 3.359   Anemia Panel: No results for input(s): VITAMINB12, FOLATE, FERRITIN, TIBC, IRON, RETICCTPCT in the last 72 hours. Sepsis Labs: Recent Labs  Lab 07/31/18 1245 08/01/18 0019  LATICACIDVEN 1.1 1.2    Recent Results (from the past 240 hour(s))  C difficile quick scan w PCR reflex     Status: None   Collection Time: 07/26/18  3:53 PM   Specimen: STOOL  Result Value Ref Range Status   C Diff antigen NEGATIVE NEGATIVE Final   C Diff toxin NEGATIVE NEGATIVE Final   C Diff interpretation No C. difficile detected.  Final    Comment: Performed at [facility], [place] [address] [place], [place] [address]  Gastrointestinal Panel by PCR , Stool     Status: None   Collection Time: 07/26/18  3:53 PM   Specimen: STOOL  Result Value Ref Range Status   Campylobacter species NOT DETECTED NOT DETECTED Final   Plesimonas shigelloides NOT DETECTED NOT DETECTED Final   Salmonella species NOT DETECTED NOT DETECTED Final   Yersinia enterocolitica NOT DETECTED NOT DETECTED Final   Vibrio species NOT DETECTED NOT DETECTED Final   Vibrio cholerae NOT DETECTED NOT DETECTED Final   Enteroaggregative E coli (EAEC) NOT DETECTED NOT DETECTED Final   Enteropathogenic E coli (EPEC) NOT DETECTED NOT DETECTED Final   Enterotoxigenic E coli (ETEC) NOT DETECTED NOT DETECTED Final   Shiga like toxin producing E coli (STEC) NOT  DETECTED NOT DETECTED Final   Shigella/Enteroinvasive E coli (EIEC) NOT DETECTED NOT DETECTED Final   Cryptosporidium NOT DETECTED NOT DETECTED Final   Cyclospora cayetanensis NOT DETECTED NOT DETECTED Final   Entamoeba histolytica NOT DETECTED NOT DETECTED Final   Giardia lamblia NOT DETECTED NOT DETECTED Final   Adenovirus F40/41 NOT DETECTED NOT DETECTED Final   Astrovirus NOT DETECTED NOT DETECTED Final   Norovirus GI/GII NOT DETECTED NOT DETECTED Final   Rotavirus A NOT DETECTED NOT DETECTED Final   Sapovirus (I, II, IV, and V) NOT DETECTED NOT DETECTED Final    Comment: Performed at [facility], [address] [place], [place] [address]  SARS Coronavirus 2 (CEPHEID - Performed in [place] hospital lab), Hosp Order     Status: None   Collection Time: 07/31/18  8:41 PM   Specimen: Nasopharyngeal Swab  Result Value Ref Range Status   SARS Coronavirus 2 NEGATIVE  NEGATIVE Final    Comment: (NOTE) If result is NEGATIVE SARS-CoV-2 target nucleic acids are NOT DETECTED. The SARS-CoV-2 RNA is generally detectable in upper and lower  respiratory specimens during the acute phase of infection. The lowest  concentration of SARS-CoV-2 viral copies this assay can detect is 250  copies / mL. A negative result does not preclude SARS-CoV-2 infection  and should not be used as the sole basis for treatment or other  patient management decisions.  A negative result may occur with  improper specimen collection / handling, submission of specimen other  than nasopharyngeal swab, presence of viral mutation(s) within the  areas targeted by this assay, and inadequate number of viral copies  (<250 copies / mL). A negative result must be combined with clinical  observations, patient history, and epidemiological information. If result is POSITIVE SARS-CoV-2 target nucleic acids are DETECTED. The SARS-CoV-2 RNA is generally detectable in upper and lower  respiratory specimens dur ing the  acute phase of infection.  Positive  results are indicative of active infection with SARS-CoV-2.  Clinical  correlation with patient history and other diagnostic information is  necessary to determine patient infection status.  Positive results do  not rule out bacterial infection or co-infection with other viruses. If result is PRESUMPTIVE POSTIVE SARS-CoV-2 nucleic acids MAY BE PRESENT.   A presumptive positive result was obtained on the submitted specimen  and confirmed on repeat testing.  While 2019 novel coronavirus  (SARS-CoV-2) nucleic acids may be present in the submitted sample  additional confirmatory testing may be necessary for epidemiological  and / or clinical management purposes  to differentiate between  SARS-CoV-2 and other Sarbecovirus currently known to infect humans.  If clinically indicated additional testing with an alternate test  methodology [phone]) is advised. The SARS-CoV-2 RNA is generally  detectable in upper and lower respiratory sp ecimens during the acute  phase of infection. The expected result is Negative. Fact Sheet for Patients:  StrictlyIdeas.no Fact Sheet for Healthcare Providers: BankingDealers.co.za This test is not yet approved or cleared by the [place] FDA and has been authorized for detection and/or diagnosis of SARS-CoV-2 by FDA under an Emergency Use Authorization (EUA).  This EUA will remain in effect (meaning this test can be used) for the duration of the COVID-19 declaration under Section 564(b)(1) of the Act, 21 U.S.C. section 360bbb-3(b)(1), unless the authorization is terminated or revoked sooner. Performed at [facility], [place] [address] [place], [place] [address]   Surgical pcr screen     Status: None   Collection Time: 07/31/18 11:09 PM   Specimen: Nasal Mucosa; Nasal Swab  Result Value Ref Range Status   MRSA, PCR NEGATIVE NEGATIVE Final   Staphylococcus aureus  NEGATIVE NEGATIVE Final    Comment: (NOTE) The Xpert SA Assay (FDA approved for NASAL specimens in patients 27 years of age and older), is one component of a comprehensive surveillance program. It is not intended to diagnose infection nor to guide or monitor treatment. Performed at [facility], [place] [address] [place], [place] [address]          Radiology Studies: US Abdomen Limited Ruq  Result Date: 07/31/2018 CLINICAL DATA:  Upper abdominal pain several months worse today. EXAM: ULTRASOUND ABDOMEN LIMITED RIGHT UPPER QUADRANT COMPARISON:  HIDA scan 07/10/2018 and CT 07/09/2018 FINDINGS: Gallbladder: Moderate cholelithiasis with largest stone measuring 1.4 cm. Positive sonographic Murphy sign with mild adjacent free fluid. Edematous gallbladder wall thickening measuring 7.2 mm which is  nonspecific in the setting of mild ascites. Findings may be due to acute cholecystitis, although note the patient had a negative HIDA scan for acute cholecystitis 07/10/2018. Common bile duct: Diameter: 4.7 mm. Liver: No focal lesion identified. Within normal limits in parenchymal echogenicity. Portal vein is patent on color Doppler imaging with normal direction of blood flow towards the liver. IMPRESSION: Moderate cholelithiasis with positive sonographic Murphy sign. Nonspecific gallbladder wall thickening of 7.2 mm in the setting of mild ascites. Findings may be due to acute cholecystitis. Electronically Signed   By: Marin Olp M.D.   On: 07/31/2018 17:19        Scheduled Meds: . amLODipine  5 mg Oral Daily  . carvedilol  12.5 mg Oral BID WC  . Chlorhexidine Gluconate Cloth  6 each Topical Q0600  . insulin aspart  0-9 Units Subcutaneous Q4H  . sodium chloride flush  3 mL Intravenous Q12H   Continuous Infusions: . sodium chloride    . sodium chloride    . sodium chloride    . piperacillin-tazobactam (ZOSYN)  IV 3.375 g (07/31/18 2346)     LOS: 1 day        Aline August, MD  [facility] 08/01/2018, 11:18 AM

## 2018-08-01 NOTE — Progress Notes (Signed)
[name], male   DOB: 01-09-1961, 58 y.o.   MRN: [id number]       Subjective: No new complaints, still with some RUQ abdominal tenderness  Objective: Vital signs in last 24 hours: Temp:  [96.7 F (35.9 C)-98.2 F (36.8 C)] 98.2 F (36.8 C) (07/21 0402) Pulse Rate:  [64-88] 67 (07/21 0402) Resp:  [9-25] 18 (07/21 0402) BP: (130-193)/(58-137) 132/88 (07/21 0402) SpO2:  [90 %-100 %] 97 % (07/21 0402) Weight:  [73.1 kg] 73.1 kg (07/20 2301) Last BM Date: 07/31/18  Intake/Output from previous day: No intake/output data recorded. Intake/Output this shift: No intake/output data recorded.  PE: Abd: soft, ND, tender in RUQ, ventral hernia noted as well as inguinal hernias.  All stable  Lab Results:  Recent Labs    07/31/18 1221 08/01/18 0019  WBC 4.7 4.1  HGB 10.5* 10.1*  HCT 33.3* 32.8*  PLT 124* 125*   BMET Recent Labs    07/31/18 1221 08/01/18 0019  NA 140 140  K 4.6 4.8  CL 101 101  CO2 26 28  GLUCOSE 132* 90  BUN 25* 28*  CREATININE 7.41* 7.73*  CALCIUM 9.5 9.6   PT/INR No results for input(s): LABPROT, INR in the last 72 hours. CMP     Component Value Date/Time   NA 140 08/01/2018 0019   NA 141 08/17/2016 1203   K 4.8 08/01/2018 0019   CL 101 08/01/2018 0019   CO2 28 08/01/2018 0019   GLUCOSE 90 08/01/2018 0019   BUN 28 (H) 08/01/2018 0019   BUN 33 (H) 08/17/2016 1203   CREATININE 7.73 (H) 08/01/2018 0019   CALCIUM 9.6 08/01/2018 0019   PROT 6.9 08/01/2018 0019   PROT 7.1 06/25/2013 1007   ALBUMIN 3.5 08/01/2018 0019   ALBUMIN 4.0 06/25/2013 1007   AST 15 08/01/2018 0019   ALT 11 08/01/2018 0019   ALKPHOS 78 08/01/2018 0019   BILITOT 0.9 08/01/2018 0019   GFRNONAA 7 (L) 08/01/2018 0019   GFRAA 8 (L) 08/01/2018 0019   Lipase     Component Value Date/Time   LIPASE 26 07/31/2018 1221       Studies/Results: US Abdomen Limited Ruq  Result Date: 07/31/2018 CLINICAL DATA:  Upper abdominal pain several months worse  today. EXAM: ULTRASOUND ABDOMEN LIMITED RIGHT UPPER QUADRANT COMPARISON:  HIDA scan 07/10/2018 and CT 07/09/2018 FINDINGS: Gallbladder: Moderate cholelithiasis with largest stone measuring 1.4 cm. Positive sonographic Murphy sign with mild adjacent free fluid. Edematous gallbladder wall thickening measuring 7.2 mm which is nonspecific in the setting of mild ascites. Findings may be due to acute cholecystitis, although note the patient had a negative HIDA scan for acute cholecystitis 07/10/2018. Common bile duct: Diameter: 4.7 mm. Liver: No focal lesion identified. Within normal limits in parenchymal echogenicity. Portal vein is patent on color Doppler imaging with normal direction of blood flow towards the liver. IMPRESSION: Moderate cholelithiasis with positive sonographic Murphy sign. Nonspecific gallbladder wall thickening of 7.2 mm in the setting of mild ascites. Findings may be due to acute cholecystitis. Electronically Signed   By: Marin Olp M.D.   On: 07/31/2018 17:19    Anti-infectives: Anti-infectives (From admission, onward)   Start     Dose/Rate Route Frequency Ordered Stop   07/31/18 2300  piperacillin-tazobactam (ZOSYN) IVPB 3.375 g     3.375 g 12.5 mL/hr over 240 Minutes Intravenous Every 12 hours 07/31/18 2214     07/31/18 2000  cefTRIAXone (ROCEPHIN) 2 g in sodium chloride 0.9 % 100  mL IVPB  Status:  Discontinued     2 g 200 mL/hr over 30 Minutes Intravenous Every 24 hours 07/31/18 1959 07/31/18 2214       Assessment/Plan  ESRD, HD CAD DM HLD HTN H/O MI Polymyalgia rheumatica H/O CVA  Cholecystitis -no plans for surgery today.  CLD -HD today for completion of yesterday -cards to evaluate him today to determine surgical risk.  Once this is completed then we will further make decisions on surgical plans. -will follow  FEN - IVFs,CLD VTE - ok for chemical prophylaxis from our standpoint ID - [email or web address]   LOS: 1 day    Henreitta Cea , [facility]  08/01/2018, 8:35 AM Pager: [phone]

## 2018-08-01 NOTE — Progress Notes (Signed)
PT Cancellation Note  Patient Details Name: Matthew Kline MRN: [id number] DOB: 02/09/1960   Cancelled Treatment:    Reason Eval/Treat Not Completed: Patient at procedure or test/unavailable  Currently in HD;  Will follow up later today as time allows;  Otherwise, will follow up for PT tomorrow;   Thank you,  Roney Marion, PT  Acute Rehabilitation Services Pager [phone] Office [phone]    [place]ta [place] 08/01/2018, 9:29 AM

## 2018-08-01 NOTE — Consult Note (Addendum)
[facility] Renal Consultation Note    Indication for Consultation:  Management of ESRD/hemodialysis; anemia, hypertension/volume and secondary hyperparathyroidism  DPO:EUMPNT, [facility]  HPI: Matthew Kline is a 58 y.o. male. ESRD thought to be 2/2 DM on HD MWF at [facility], first starting in 12/2012.  Past medical history significant for DM, HTN, h/o lacunar stroke, CHF, CAD s/p CABG x3 in 5/208, PAD with multiple toe amputations & s/p L AKA and PMR with chronic pain.  Patient seen and examined at bedside in dialysis.  Reports intermittent upper abdominal pain which has been worsening over the last several weeks and became unbearable during dialysis yesterday.  He completed about 30 minutes of his treatment and was sent to the ED to be evaluated.  He has missed multiple dialysis treatments due to pain and n/v/d.  States the pain is in the epigastric and RUQ.  It is associated with nausea, vomiting and diarrhea, and is worse an hour or so after eating.  Believes he has been losing weight because he has not been able to eat.  Today his pain is improved but he has been NPO overnight.  He is tolerating dialysis well.  Admits to LE edema, nausea and dry heaves.  Denies SOB, fever, chills, and edema.   Previous workup during last admission (6/28-7/1) included a CT NGO with gallstones, moderate ascites and b/l inguinal hernias; negative HIDA scan and gallbladder ejection fraction low at 27%.  Pertinent findings since admission include Abd US showing "moderate cholelithiasis with positive sonographic Murphy sign and nonspecific gallbladder wall thickening of 7.2 mm in the setting of mild ascites".   Past Medical History:  Diagnosis Date  . Anemia, unspecified   . Anxiety   . Arthralgia 2010   polyarticular  . Arthritis    "back, knees" (01/10/2017)  . Cancer [facility])    "kidney area" (01/10/2017)  . CHF (congestive heart failure) ([place]) 07/25/2009   denies  . Chronic lower back  pain   . Coronary artery disease   . Coughing    pt. reports that he has drainage from sinus infection  . Diabetic foot ulcer ([place])   . Diabetic neuropathy ([place])   . ESRD (end stage renal disease) on dialysis [facility])    started 12/2012; "MWF; [place] "  (01/10/2017)  . GERD (gastroesophageal reflux disease)    hx "before I lost weight", no problem 9 years  . Hemodialysis access site with mature fistula ([place])   . Hemorrhoids, internal 10/2011   small  . High cholesterol   . History of blood transfusion    "related to the anemia"  . Hypertension   . Insomnia, unspecified   . Lacunar infarction ([place]) 2006   RUE/RLE, speech  . Long term (current) use of anticoagulants   . Myocardial infarction ([place]) 1995  . Orthostatic hypotension   . Osteomyelitis of foot, left, acute ([place])   . Other chronic postoperative pain   . Pneumonia    "probably twice" (01/10/2017)  . Polymyalgia rheumatica ([place])   . Renal insufficiency   . Sleep apnea    "lost weight; no more problem" (01/10/2017)  . Stroke ([place]) 01/10/06   denies residual on 05/09/2014  . Type II diabetes mellitus ([place]) dx'd 1995  . Unspecified hereditary and idiopathic peripheral neuropathy    feet  . Unspecified osteomyelitis, site unspecified   . Unspecified vitamin D deficiency    Past Surgical History:  Procedure Laterality Date  . ABDOMINAL AORTOGRAM N/A 08/25/2016  Procedure: ABDOMINAL AORTOGRAM;  Surgeon: Wellington Hampshire, MD;  Location: [place] [facility];  Service: Cardiovascular;  Laterality: N/A;  . AMPUTATION  01/21/2012   Procedure: AMPUTATION RAY;  Surgeon: [name], MD;  Location: [place];  Service: Orthopedics;  Laterality: Left;  Left Foot 4th Ray Amputation  . AMPUTATION Left 05/04/2013   Procedure: AMPUTATION DIGIT;  Surgeon: [name], MD;  Location: [place];  Service: Orthopedics;  Laterality: Left;  Left Great Toe Amputation at [facility]  . AMPUTATION Left 01/14/2017   Procedure: AMPUTATION ABOVE LEFT  KNEE;  Surgeon: [name], MD;  Location: [place];  Service: Orthopedics;  Laterality: Left;  . ANTERIOR CERVICAL DECOMP/DISCECTOMY FUSION  02/2011  . BACK SURGERY    . BASCILIC VEIN TRANSPOSITION Left 10/19/2012   Procedure: BASCILIC VEIN TRANSPOSITION;  Surgeon: Serafina Mitchell, MD;  Location: [place];  Service: Vascular;  Laterality: Left;  . CARDIAC CATHETERIZATION     "before bypass"  . CORONARY ARTERY BYPASS GRAFT     x 5 with lima at [place] WITH ANTIBIOTIC SPACERS Left 08/07/2014   Procedure: Replace Left Total Knee Arthroplasty,  Place Antibiotic Spacer;  Surgeon: [name], MD;  Location: [place];  Service: Orthopedics;  Laterality: Left;  . I&D EXTREMITY Left 05/09/2014   Procedure: Irrigation and Debridement Left Knee and Closure of Total Knee Arthroplasty Incision;  Surgeon: [name], MD;  Location: [place];  Service: Orthopedics;  Laterality: Left;  . I&D KNEE WITH POLY EXCHANGE Left 05/31/2014   Procedure: IRRIGATION AND DEBRIDEMENT LEFT KNEE, PLACE ANTIBIOTIC BEADS,  POLY EXCHANGE;  Surgeon: [name], MD;  Location: [place];  Service: Orthopedics;  Laterality: Left;  . IRRIGATION AND DEBRIDEMENT KNEE Left 01/12/2017   Procedure: IRRIGATION AND DEBRIDEMENT LEFT KNEE;  Surgeon: [name], MD;  Location: [place];  Service: Orthopedics;  Laterality: Left;  . JOINT REPLACEMENT    . KNEE ARTHROSCOPY Left 08-25-2012  . LOWER EXTREMITY ANGIOGRAPHY Left 08/25/2016   Procedure: Lower Extremity Angiography;  Surgeon: Wellington Hampshire, MD;  Location: [place] [facility];  Service: Cardiovascular;  Laterality: Left;  . PERIPHERAL VASCULAR BALLOON ANGIOPLASTY Left 08/25/2016   Procedure: PERIPHERAL VASCULAR BALLOON ANGIOPLASTY;  Surgeon: Wellington Hampshire, MD;  Location: [place] [facility];  Service: Cardiovascular;  Laterality: Left;  lt peroneal and ant tibial arteries cutting balloon  . REFRACTIVE SURGERY Bilateral   . TOE AMPUTATION  Bilateral    "I've lost 7 toes over the last 7 years" (05/09/2014)  . TOE SURGERY Left April 2015   Big toe removed on left foot.  . TONSILLECTOMY    . TOTAL KNEE ARTHROPLASTY Left 04/10/2014   Procedure: TOTAL KNEE ARTHROPLASTY;  Surgeon: [name], MD;  Location: [place];  Service: Orthopedics;  Laterality: Left;  . TOTAL KNEE REVISION Left 10/25/2014   Procedure: LEFT TOTAL KNEE REVISION;  Surgeon: [name], MD;  Location: [place];  Service: Orthopedics;  Laterality: Left;  . TOTAL KNEE REVISION Left 11/26/2015   Procedure: Removal Left Total Knee Arthroplasty, Hinged Total Knee Arthroplasty;  Surgeon: [name], MD;  Location: [place];  Service: Orthopedics;  Laterality: Left;  . UVULOPALATOPHARYNGOPLASTY, TONSILLECTOMY AND SEPTOPLASTY  ~ 1989  . WOUND DEBRIDEMENT Left 05/09/2014   Dehiscence Left Total Knee Arthroplasty Incision   Family History  Problem Relation Age of Onset  . Hypertension Mother   . Cancer Mother 54  Ovarian  . Heart disease Maternal Aunt   . Stroke Maternal Grandfather    Social History:  reports that he quit smoking about 21 months ago. His smoking use included cigarettes. He has a 3.84 pack-year smoking history. He has never used smokeless tobacco. He reports that he does not drink alcohol or use drugs. Allergies  Allergen Reactions  . Morphine And Related Other (See Comments)    hallucinations  . Tygacil [Tigecycline] Nausea And Vomiting and Other (See Comments)    Makes him feel crazy  . Imodium [Loperamide] Itching   Prior to Admission medications   Medication Sig Start Date End Date Taking? Authorizing Provider  amLODipine (NORVASC) 5 MG tablet Take 5 mg by mouth daily.    Yes [provider]  aspirin EC 81 MG EC tablet Take 2 tablets (162 mg total) by mouth daily. Patient taking differently: Take 81 mg by mouth daily.  01/20/17  Yes Patrecia Pour, Christean Grief, MD  carvedilol (COREG) 12.5 MG tablet Take 1 tablet (12.5 mg total) by mouth  2 (two) times daily with a meal. 02/08/17  Yes Bonnell Public, MD  Darbepoetin Alfa (ARANESP) 150 MCG/0.3ML SOSY injection Inject 0.3 mLs (150 mcg total) into the vein every Friday with hemodialysis. 02/11/17  Yes Bonnell Public, MD  dicyclomine (BENTYL) 20 MG tablet Take 1 tablet (20 mg total) by mouth every 6 (six) hours as needed for spasms (abdominal pain). 07/26/18  Yes Virgel Manifold, MD  hydrOXYzine (ATARAX/VISTARIL) 50 MG tablet Take 50 mg by mouth 3 (three) times daily as needed for itching.    Yes [provider]  multivitamin (RENA-VIT) TABS tablet Take 1 tablet by mouth at bedtime. 02/08/17  Yes Dana Allan I, MD  sevelamer carbonate (RENVELA) 800 MG tablet Take 32,000 mg by mouth 3 (three) times daily with meals.    Yes [provider]  temazepam (RESTORIL) 30 MG capsule Take 30 mg by mouth at bedtime as needed for sleep.   Yes [provider]  Amino Acids-Protein Hydrolys (FEEDING SUPPLEMENT, PRO-STAT SUGAR FREE 64,) LIQD Take 30 mLs by mouth 2 (two) times daily. Patient not taking: Reported on 11/30/2017 01/19/17   Patrecia Pour, Christean Grief, MD  ferric citrate (AURYXIA) 1 GM 210 MG(Fe) tablet Take 2 tablets (420 mg total) by mouth 3 (three) times daily with meals. Patient not taking: Reported on 11/30/2017 02/08/17   Dana Allan I, MD  gabapentin (NEURONTIN) 300 MG capsule TAKE 1 CAPSULE BY MOUTH THREE TIMES A DAY Patient not taking: No sig reported 10/24/17   [name], MD  montelukast (SINGULAIR) 10 MG tablet TAKE 1 TABLET (10 MG TOTAL) BY MOUTH AT BEDTIME. Patient not taking: No sig reported 09/01/15   Gildardo Cranker, DO  Nutritional Supplements (FEEDING SUPPLEMENT, NEPRO CARB STEADY,) LIQD Take 237 mLs by mouth 2 (two) times daily between meals. Patient not taking: Reported on 11/30/2017 01/19/17   Patrecia Pour, Christean Grief, MD  ondansetron (ZOFRAN ODT) 4 MG disintegrating tablet Take 1 tablet (4 mg total) by mouth every 8 (eight) hours as needed for  nausea or vomiting. Patient not taking: Reported on 07/10/2018 12/16/17   Kinnie Feil, PA-C  pentoxifylline (TRENTAL) 400 MG CR tablet TAKE 1 TABLET (400 MG TOTAL) BY MOUTH 3 (THREE) TIMES DAILY WITH MEALS. Patient not taking: Reported on 07/10/2018 02/22/18   [name], MD  traMADol (ULTRAM) 50 MG tablet Take 1 tablet (50 mg total) by mouth every 6 (six) hours as needed. Patient not taking:  Reported on 07/10/2018 12/16/17   Kinnie Feil, PA-C   Current Facility-Administered Medications  Medication Dose Route Frequency Provider Last Rate Last Dose  . 0.9 %  sodium chloride infusion  250 mL Intravenous PRN Doutova, Anastassia, MD      . 0.9 %  sodium chloride infusion  100 mL Intravenous PRN Penninger, Ria Comment, PA      . 0.9 %  sodium chloride infusion  100 mL Intravenous PRN Penninger, Ria Comment, PA      . acetaminophen (TYLENOL) tablet 650 mg  650 mg Oral Q6H PRN Doutova, Anastassia, MD       Or  . acetaminophen (TYLENOL) suppository 650 mg  650 mg Rectal Q6H PRN Doutova, Anastassia, MD      . alteplase (CATHFLO ACTIVASE) injection 2 mg  2 mg Intracatheter Once PRN Penninger, Ria Comment, PA      . amLODipine (NORVASC) tablet 5 mg  5 mg Oral Daily Doutova, Anastassia, MD      . carvedilol (COREG) tablet 12.5 mg  12.5 mg Oral BID WC Doutova, Anastassia, MD   12.5 mg at 08/01/18 0736  . Chlorhexidine Gluconate Cloth 2 % PADS 6 each  6 each Topical Q0600 Penninger, [place], [place]      . heparin injection 1,000 Units  1,000 Units Dialysis PRN Penninger, Ria Comment, PA      . heparin injection 4,000 Units  4,000 Units Dialysis PRN Penninger, Ria Comment, PA      . insulin aspart (novoLOG) injection 0-9 Units  0-9 Units Subcutaneous Q4H Doutova, Anastassia, MD      . lidocaine (PF) (XYLOCAINE) 1 % injection 5 mL  5 mL Intradermal PRN Penninger, Ria Comment, PA      . lidocaine-prilocaine (EMLA) cream 1 application  1 application Topical PRN Penninger, Ria Comment, PA      . pentafluoroprop-tetrafluoroeth  (GEBAUERS) aerosol 1 application  1 application Topical PRN Penninger, Ria Comment, PA      . piperacillin-tazobactam (ZOSYN) IVPB 3.375 g  3.375 g Intravenous Q12H Dang, Thuy D, RPH 12.5 mL/hr at 07/31/18 2346 3.375 g at 07/31/18 2346  . sodium chloride flush (NS) 0.9 % injection 3 mL  3 mL Intravenous Q12H Doutova, Anastassia, MD   3 mL at 07/31/18 2349  . sodium chloride flush (NS) 0.9 % injection 3 mL  3 mL Intravenous PRN Doutova, Anastassia, MD      . temazepam (RESTORIL) capsule 30 mg  30 mg Oral QHS PRN Toy Baker, MD   30 mg at 07/31/18 2353   Labs: Basic Metabolic Panel: Recent Labs  Lab 07/26/18 1143 07/31/18 1221 08/01/18 0019  NA 137 140 140  K 5.4* 4.6 4.8  CL 98 101 101  CO2 25 26 28   GLUCOSE 114* 132* 90  BUN 43* 25* 28*  CREATININE 8.51* 7.41* 7.73*  CALCIUM 9.8 9.5 9.6  PHOS  --   --  4.8*   Liver Function Tests: Recent Labs  Lab 07/26/18 1143 07/31/18 1221 08/01/18 0019  AST 18 16 15   ALT 11 11 11   ALKPHOS 88 88 78  BILITOT 1.3* 1.1 0.9  PROT 6.9 7.1 6.9  ALBUMIN 3.4* 3.7 3.5   Recent Labs  Lab 07/26/18 1143 07/31/18 1221  LIPASE 23 26   CBC: Recent Labs  Lab 07/26/18 1143 07/31/18 1221 08/01/18 0019  WBC 3.9* 4.7 4.1  NEUTROABS  --  3.2  --   HGB 10.9* 10.5* 10.1*  HCT 34.7* 33.3* 32.8*  MCV 98.3 97.7 97.3  PLT 112* 124* 125*  CBG: Recent Labs  Lab 07/31/18 2301 08/01/18 0402 08/01/18 0757  GLUCAP 95 72 70   Studies/Results: US Abdomen Limited Ruq  Result Date: 07/31/2018 CLINICAL DATA:  Upper abdominal pain several months worse today. EXAM: ULTRASOUND ABDOMEN LIMITED RIGHT UPPER QUADRANT COMPARISON:  HIDA scan 07/10/2018 and CT 07/09/2018 FINDINGS: Gallbladder: Moderate cholelithiasis with largest stone measuring 1.4 cm. Positive sonographic Murphy sign with mild adjacent free fluid. Edematous gallbladder wall thickening measuring 7.2 mm which is nonspecific in the setting of mild ascites. Findings may be due to acute  cholecystitis, although note the patient had a negative HIDA scan for acute cholecystitis 07/10/2018. Common bile duct: Diameter: 4.7 mm. Liver: No focal lesion identified. Within normal limits in parenchymal echogenicity. Portal vein is patent on color Doppler imaging with normal direction of blood flow towards the liver. IMPRESSION: Moderate cholelithiasis with positive sonographic Murphy sign. Nonspecific gallbladder wall thickening of 7.2 mm in the setting of mild ascites. Findings may be due to acute cholecystitis. Electronically Signed   By: Marin Olp M.D.   On: 07/31/2018 17:19    ROS: All others negative except those listed in HPI.  Physical Exam: Vitals:   08/01/18 0930 08/01/18 1000 08/01/18 1030 08/01/18 1100  BP: 122/73 (!) 96/56 124/76 128/78  Pulse: 65 63 64 66  Resp: 12 13 14 15   Temp:      TempSrc:      SpO2:      Weight:      Height:         General: WD, NAD, chronically ill appearing male, laying in bed Head: NCAT sclera not icteric MMM Neck: Supple. No lymphadenopathy Lungs: mostly CTA bilaterally.+crackles in bases. No wheeze or rhonchi. Breathing is unlabored. Heart: RRR. No murmur, rubs or gallops.  Abdomen: soft, +tenderness in RUQ, +BS Lower extremities:L BKA, R 2+ LE edema  Neuro: AAOx3. Moves all extremities spontaneously. Psych:  Responds to questions appropriately with a normal affect. Dialysis Access: LU AVF cannulated  Dialysis Orders:  MWF - [facility]  4hrs, BFR 400, DFR AF2.0,  EDW 70.5kg, 2K/ 2Ca  Access: LU AVF  Heparin 4000 unit bolus Mircera 75 mcg q2wks - last 6/23 Hectorol 63mcg IV qHD   Assessment/Plan: 1.  Acute cholecystitis - Abd US shows moderate cholelithiasis & GB thickening with positive Murphy's sign. Surgery plans for laparoscopic cholecystectomy some point this week. Cardio cleared him for surgery. 2.  ESRD -  On HD MWF.  HD today off schedule.  Plan for HD tomorrow per regular schedule, if surgery is scheduled for tomorrow can plan  for HD off schedule on Thursday.   3.  Hypertension/volume  - BP at goal. Increased volume on exam.  Increased UF today to 3.5L, plan for additional volume removal with next HD. Believes he is losing weight, may need to lower dry.  4.  Anemia of CKD - Hgb 10.1, ESA with HD tomorrow, follow trends anticipate drop post surgery.  5.  Secondary Hyperparathyroidism -  Ca and Phos at goal. Continue VDRA and binders once eating. 6.  Nutrition - Currently NPO.  Renal diet once advanced. 7. CAD s/p CABG 8. PAD  9. DMT2 10. Inguinal hernias - Surgery consulted during last admission, will need elective repair  Jen Mow, PA-C [facility] Pager: [phone] 08/01/2018, 12:25 PM   Pt seen, examined and agree w A/P as above.  Kelly Splinter  MD 08/01/2018, 4:30 PM

## 2018-08-02 ENCOUNTER — Inpatient Hospital Stay (HOSPITAL_COMMUNITY): Payer: Medicare HMO | Admitting: Certified Registered"

## 2018-08-02 ENCOUNTER — Encounter (HOSPITAL_COMMUNITY)
Admission: EM | Disposition: A | Discharge status: Home Health | Payer: Self-pay | Source: Home / Self Care | Attending: Emergency Medicine

## 2018-08-02 ENCOUNTER — Inpatient Hospital Stay (HOSPITAL_COMMUNITY): Payer: Medicare HMO

## 2018-08-02 ENCOUNTER — Encounter (HOSPITAL_COMMUNITY): Payer: Self-pay | Admitting: Certified Registered"

## 2018-08-02 DIAGNOSIS — K810 Acute cholecystitis: Secondary | ICD-10-CM | POA: Diagnosis not present

## 2018-08-02 DIAGNOSIS — I120 Hypertensive chronic kidney disease with stage 5 chronic kidney disease or end stage renal disease: Secondary | ICD-10-CM | POA: Diagnosis not present

## 2018-08-02 DIAGNOSIS — I10 Essential (primary) hypertension: Secondary | ICD-10-CM | POA: Diagnosis not present

## 2018-08-02 DIAGNOSIS — N186 End stage renal disease: Secondary | ICD-10-CM | POA: Diagnosis not present

## 2018-08-02 DIAGNOSIS — K8010 Calculus of gallbladder with chronic cholecystitis without obstruction: Secondary | ICD-10-CM | POA: Diagnosis not present

## 2018-08-02 DIAGNOSIS — I2510 Atherosclerotic heart disease of native coronary artery without angina pectoris: Secondary | ICD-10-CM | POA: Diagnosis not present

## 2018-08-02 DIAGNOSIS — E1122 Type 2 diabetes mellitus with diabetic chronic kidney disease: Secondary | ICD-10-CM | POA: Diagnosis not present

## 2018-08-02 HISTORY — PX: CHOLECYSTECTOMY: SHX55

## 2018-08-02 LAB — CBC WITH DIFFERENTIAL/PLATELET
Abs Immature Granulocytes: 0 10*3/uL (ref 0.00–0.07)
Basophils Absolute: 0 10*3/uL (ref 0.0–0.1)
Basophils Relative: 1 %
Eosinophils Absolute: 0.1 10*3/uL (ref 0.0–0.5)
Eosinophils Relative: 3 %
HCT: 31.5 % — ABNORMAL LOW (ref 39.0–52.0)
Hemoglobin: 10 g/dL — ABNORMAL LOW (ref 13.0–17.0)
Immature Granulocytes: 0 %
Lymphocytes Relative: 26 %
Lymphs Abs: 0.8 10*3/uL (ref 0.7–4.0)
MCH: 30.3 pg (ref 26.0–34.0)
MCHC: 31.7 g/dL (ref 30.0–36.0)
MCV: 95.5 fL (ref 80.0–100.0)
Monocytes Absolute: 0.4 10*3/uL (ref 0.1–1.0)
Monocytes Relative: 12 %
Neutro Abs: 1.8 10*3/uL (ref 1.7–7.7)
Neutrophils Relative %: 58 %
Platelets: 88 10*3/uL — ABNORMAL LOW (ref 150–400)
RBC: 3.3 MIL/uL — ABNORMAL LOW (ref 4.22–5.81)
RDW: 14.6 % (ref 11.5–15.5)
WBC: 3.1 10*3/uL — ABNORMAL LOW (ref 4.0–10.5)
nRBC: 0 % (ref 0.0–0.2)

## 2018-08-02 LAB — GLUCOSE, CAPILLARY
Glucose-Capillary: 104 mg/dL — ABNORMAL HIGH (ref 70–99)
Glucose-Capillary: 175 mg/dL — ABNORMAL HIGH (ref 70–99)
Glucose-Capillary: 181 mg/dL — ABNORMAL HIGH (ref 70–99)
Glucose-Capillary: 70 mg/dL (ref 70–99)
Glucose-Capillary: 72 mg/dL (ref 70–99)
Glucose-Capillary: 84 mg/dL (ref 70–99)
Glucose-Capillary: 94 mg/dL (ref 70–99)

## 2018-08-02 LAB — COMPREHENSIVE METABOLIC PANEL
ALT: 9 U/L (ref 0–44)
AST: 12 U/L — ABNORMAL LOW (ref 15–41)
Albumin: 3.1 g/dL — ABNORMAL LOW (ref 3.5–5.0)
Alkaline Phosphatase: 69 U/L (ref 38–126)
Anion gap: 12 (ref 5–15)
BUN: 16 mg/dL (ref 6–20)
CO2: 26 mmol/L (ref 22–32)
Calcium: 8.9 mg/dL (ref 8.9–10.3)
Chloride: 96 mmol/L — ABNORMAL LOW (ref 98–111)
Creatinine, Ser: 5.48 mg/dL — ABNORMAL HIGH (ref 0.61–1.24)
GFR calc Af Amer: 12 mL/min — ABNORMAL LOW (ref >60)
GFR calc non Af Amer: 10 mL/min — ABNORMAL LOW (ref >60)
Glucose, Bld: 81 mg/dL (ref 70–99)
Potassium: 3.9 mmol/L (ref 3.5–5.1)
Sodium: 134 mmol/L — ABNORMAL LOW (ref 135–145)
Total Bilirubin: 0.7 mg/dL (ref 0.3–1.2)
Total Protein: 6.3 g/dL — ABNORMAL LOW (ref 6.5–8.1)

## 2018-08-02 LAB — MAGNESIUM: Magnesium: 2.1 mg/dL (ref 1.7–2.4)

## 2018-08-02 SURGERY — LAPAROSCOPIC CHOLECYSTECTOMY WITH INTRAOPERATIVE CHOLANGIOGRAM
Anesthesia: General | Site: Abdomen

## 2018-08-02 MED ORDER — FENTANYL CITRATE (PF) 100 MCG/2ML IJ SOLN
INTRAMUSCULAR | Status: AC
  Filled 2018-08-02: qty 2

## 2018-08-02 MED ORDER — PROPOFOL 10 MG/ML IV BOLUS
INTRAVENOUS | Status: AC
  Filled 2018-08-02: qty 20

## 2018-08-02 MED ORDER — MIDAZOLAM HCL 5 MG/5ML IJ SOLN
INTRAMUSCULAR | Status: DC | PRN
  Administered 2018-08-02: 2 mg via INTRAVENOUS

## 2018-08-02 MED ORDER — 0.9 % SODIUM CHLORIDE (POUR BTL) OPTIME
TOPICAL | Status: DC | PRN
  Administered 2018-08-02: 1000 mL

## 2018-08-02 MED ORDER — SODIUM CHLORIDE 0.9 % IR SOLN
Status: DC | PRN
  Administered 2018-08-02: 1000 mL

## 2018-08-02 MED ORDER — FENTANYL CITRATE (PF) 100 MCG/2ML IJ SOLN
INTRAMUSCULAR | Status: DC | PRN
  Administered 2018-08-02: 150 ug via INTRAVENOUS

## 2018-08-02 MED ORDER — ROCURONIUM BROMIDE 10 MG/ML (PF) SYRINGE
PREFILLED_SYRINGE | INTRAVENOUS | Status: DC | PRN
  Administered 2018-08-02: 30 mg via INTRAVENOUS

## 2018-08-02 MED ORDER — ACETAMINOPHEN 500 MG PO TABS
1000.0000 mg | ORAL_TABLET | Freq: Four times a day (QID) | ORAL | Status: DC
  Administered 2018-08-02 – 2018-08-04 (×9): 1000 mg via ORAL
  Filled 2018-08-02 (×9): qty 2

## 2018-08-02 MED ORDER — BUPIVACAINE-EPINEPHRINE (PF) 0.25% -1:200000 IJ SOLN
INTRAMUSCULAR | Status: AC
  Filled 2018-08-02: qty 30

## 2018-08-02 MED ORDER — DEXAMETHASONE SODIUM PHOSPHATE 10 MG/ML IJ SOLN
INTRAMUSCULAR | Status: DC | PRN
  Administered 2018-08-02: 4 mg via INTRAVENOUS

## 2018-08-02 MED ORDER — SUCCINYLCHOLINE CHLORIDE 200 MG/10ML IV SOSY
PREFILLED_SYRINGE | INTRAVENOUS | Status: AC
  Filled 2018-08-02: qty 10

## 2018-08-02 MED ORDER — SODIUM CHLORIDE 0.9 % IV SOLN
INTRAVENOUS | Status: DC | PRN
  Administered 2018-08-02: 11:00:00 16 mL

## 2018-08-02 MED ORDER — SUGAMMADEX SODIUM 200 MG/2ML IV SOLN
INTRAVENOUS | Status: DC | PRN
  Administered 2018-08-02: 200 mg via INTRAVENOUS

## 2018-08-02 MED ORDER — FENTANYL CITRATE (PF) 250 MCG/5ML IJ SOLN
INTRAMUSCULAR | Status: AC
  Filled 2018-08-02: qty 5

## 2018-08-02 MED ORDER — STERILE WATER FOR IRRIGATION IR SOLN
Status: DC | PRN
  Administered 2018-08-02: 1000 mL

## 2018-08-02 MED ORDER — DARBEPOETIN ALFA 60 MCG/0.3ML IJ SOSY
60.0000 ug | PREFILLED_SYRINGE | INTRAMUSCULAR | Status: DC
  Filled 2018-08-02: qty 0.3

## 2018-08-02 MED ORDER — PROPOFOL 10 MG/ML IV BOLUS
INTRAVENOUS | Status: DC | PRN
  Administered 2018-08-02: 170 mg via INTRAVENOUS

## 2018-08-02 MED ORDER — HEMOSTATIC AGENTS (NO CHARGE) OPTIME
TOPICAL | Status: DC | PRN
  Administered 2018-08-02: 1 via TOPICAL

## 2018-08-02 MED ORDER — MIDAZOLAM HCL 2 MG/2ML IJ SOLN
INTRAMUSCULAR | Status: AC
  Filled 2018-08-02: qty 2

## 2018-08-02 MED ORDER — HYDROMORPHONE HCL 1 MG/ML IJ SOLN
0.5000 mg | INTRAMUSCULAR | Status: DC | PRN
  Administered 2018-08-02 – 2018-08-03 (×5): 1 mg via INTRAVENOUS
  Filled 2018-08-02 (×5): qty 1

## 2018-08-02 MED ORDER — HYDROXYZINE HCL 10 MG PO TABS
10.0000 mg | ORAL_TABLET | Freq: Once | ORAL | Status: AC
  Administered 2018-08-03: 10 mg via ORAL
  Filled 2018-08-02: qty 1

## 2018-08-02 MED ORDER — BUPIVACAINE-EPINEPHRINE 0.25% -1:200000 IJ SOLN
INTRAMUSCULAR | Status: DC | PRN
  Administered 2018-08-02: 11 mL

## 2018-08-02 MED ORDER — SODIUM CHLORIDE 0.9 % IV SOLN
Freq: Once | INTRAVENOUS | Status: AC
  Administered 2018-08-02: 09:00:00 via INTRAVENOUS

## 2018-08-02 MED ORDER — LIDOCAINE 2% (20 MG/ML) 5 ML SYRINGE
INTRAMUSCULAR | Status: AC
  Filled 2018-08-02: qty 5

## 2018-08-02 MED ORDER — ONDANSETRON HCL 4 MG/2ML IJ SOLN
INTRAMUSCULAR | Status: DC | PRN
  Administered 2018-08-02: 4 mg via INTRAVENOUS

## 2018-08-02 MED ORDER — ROCURONIUM BROMIDE 10 MG/ML (PF) SYRINGE
PREFILLED_SYRINGE | INTRAVENOUS | Status: AC
  Filled 2018-08-02: qty 10

## 2018-08-02 MED ORDER — SUCCINYLCHOLINE CHLORIDE 200 MG/10ML IV SOSY
PREFILLED_SYRINGE | INTRAVENOUS | Status: DC | PRN
  Administered 2018-08-02: 120 mg via INTRAVENOUS

## 2018-08-02 MED ORDER — OXYCODONE HCL 5 MG PO TABS: 5.0000 mg | ORAL_TABLET | ORAL | Status: DC | PRN

## 2018-08-02 MED ORDER — LIDOCAINE 2% (20 MG/ML) 5 ML SYRINGE
INTRAMUSCULAR | Status: DC | PRN
  Administered 2018-08-02: 40 mg via INTRAVENOUS

## 2018-08-02 MED ORDER — FENTANYL CITRATE (PF) 100 MCG/2ML IJ SOLN
25.0000 ug | INTRAMUSCULAR | Status: DC | PRN
  Administered 2018-08-02 (×2): 50 ug via INTRAVENOUS

## 2018-08-02 SURGICAL SUPPLY — 46 items
APPLIER CLIP ROT 10 11.4 M/L (STAPLE) ×3
BENZOIN TINCTURE PRP APPL 2/3 (GAUZE/BANDAGES/DRESSINGS) ×3 IMPLANT
BLADE CLIPPER SURG (BLADE) ×2 IMPLANT
CANISTER SUCT 3000ML PPV (MISCELLANEOUS) ×3 IMPLANT
CHLORAPREP W/TINT 26 (MISCELLANEOUS) ×3 IMPLANT
CLIP APPLIE ROT 10 11.4 M/L (STAPLE) ×1 IMPLANT
CLOSURE WOUND 1/2 X4 (GAUZE/BANDAGES/DRESSINGS) ×1
COVER MAYO STAND STRL (DRAPES) ×3 IMPLANT
COVER SURGICAL LIGHT HANDLE (MISCELLANEOUS) ×3 IMPLANT
COVER WAND RF STERILE (DRAPES) ×3 IMPLANT
DRAPE C-ARM 42X72 X-RAY (DRAPES) ×3 IMPLANT
DRSG TEGADERM 2-3/8X2-3/4 SM (GAUZE/BANDAGES/DRESSINGS) ×9 IMPLANT
DRSG TEGADERM 4X4.75 (GAUZE/BANDAGES/DRESSINGS) ×3 IMPLANT
ELECT REM PT RETURN 9FT ADLT (ELECTROSURGICAL) ×3
ELECTRODE REM PT RTRN 9FT ADLT (ELECTROSURGICAL) ×1 IMPLANT
GAUZE SPONGE 2X2 8PLY STRL LF (GAUZE/BANDAGES/DRESSINGS) ×1 IMPLANT
GLOVE BIO SURGEON STRL SZ7 (GLOVE) ×3 IMPLANT
GLOVE BIO SURGEON STRL SZ7.5 (GLOVE) ×2 IMPLANT
GLOVE BIOGEL PI IND STRL 7.5 (GLOVE) ×1 IMPLANT
GLOVE BIOGEL PI INDICATOR 7.5 (GLOVE) ×6
GLOVE ECLIPSE 7.0 STRL STRAW (GLOVE) ×2 IMPLANT
GOWN STRL REUS W/ TWL LRG LVL3 (GOWN DISPOSABLE) ×3 IMPLANT
GOWN STRL REUS W/TWL LRG LVL3 (GOWN DISPOSABLE) ×8
HEMOSTAT SNOW SURGICEL 2X4 (HEMOSTASIS) ×2 IMPLANT
KIT BASIN OR (CUSTOM PROCEDURE TRAY) ×3 IMPLANT
KIT TURNOVER KIT B (KITS) ×3 IMPLANT
NS IRRIG 1000ML POUR BTL (IV SOLUTION) ×3 IMPLANT
PAD ARMBOARD 7.5X6 YLW CONV (MISCELLANEOUS) ×3 IMPLANT
POUCH RETRIEVAL ECOSAC 10 (ENDOMECHANICALS) IMPLANT
POUCH RETRIEVAL ECOSAC 10MM (ENDOMECHANICALS) ×2
SCISSORS LAP 5X35 DISP (ENDOMECHANICALS) ×3 IMPLANT
SET CHOLANGIOGRAPH 5 50 .035 (SET/KITS/TRAYS/PACK) ×3 IMPLANT
SET IRRIG TUBING LAPAROSCOPIC (IRRIGATION / IRRIGATOR) ×3 IMPLANT
SET TUBE SMOKE EVAC HIGH FLOW (TUBING) ×3 IMPLANT
SLEEVE ENDOPATH XCEL 5M (ENDOMECHANICALS) ×3 IMPLANT
SPECIMEN JAR SMALL (MISCELLANEOUS) ×3 IMPLANT
SPONGE GAUZE 2X2 STER 10/PKG (GAUZE/BANDAGES/DRESSINGS) ×2
STRIP CLOSURE SKIN 1/2X4 (GAUZE/BANDAGES/DRESSINGS) ×2 IMPLANT
SUT MNCRL AB 4-0 PS2 18 (SUTURE) ×3 IMPLANT
TOWEL GREEN STERILE (TOWEL DISPOSABLE) ×3 IMPLANT
TOWEL GREEN STERILE FF (TOWEL DISPOSABLE) ×3 IMPLANT
TRAY LAPAROSCOPIC MC (CUSTOM PROCEDURE TRAY) ×3 IMPLANT
TROCAR XCEL BLUNT TIP 100MML (ENDOMECHANICALS) ×3 IMPLANT
TROCAR XCEL NON-BLD 11X100MML (ENDOMECHANICALS) ×3 IMPLANT
TROCAR XCEL NON-BLD 5MMX100MML (ENDOMECHANICALS) ×3 IMPLANT
WATER STERILE IRR 1000ML POUR (IV SOLUTION) ×3 IMPLANT

## 2018-08-02 NOTE — Transfer of Care (Signed)
Immediate Anesthesia Transfer of Care Note  Patient: Matthew Kline  Procedure(s) Performed: LAPAROSCOPIC CHOLECYSTECTOMY WITH INTRAOPERATIVE CHOLANGIOGRAM (N/A Abdomen)  Patient Location: PACU  Anesthesia Type:General  Level of Consciousness: drowsy and patient cooperative  Airway & Oxygen Therapy: Patient Spontanous Breathing and Patient connected to face mask oxygen  Post-op Assessment: Report given to RN, Post -op Vital signs reviewed and stable and Patient moving all extremities  Post vital signs: Reviewed and stable  Last Vitals:  Vitals Value Taken Time  BP 115/75 08/02/18 1104  Temp    Pulse 57 08/02/18 1105  Resp 12 08/02/18 1105  SpO2 97 % 08/02/18 1105  Vitals shown include unvalidated device data.  Last Pain:  Vitals:   08/02/18 0745  TempSrc:   PainSc: 0-No pain      Patients Stated Pain Goal: 0 (72/55/00 1642)  Complications: No apparent anesthesia complications

## 2018-08-02 NOTE — Progress Notes (Signed)
[name], male   DOB: 1960-10-13, 58 y.o.   MRN: [id number]  PROGRESS NOTE    Matthew Kline  DQQ:[id number] DOB: [birth year] DOA: 07/31/2018 PCP: [facility]   Brief Narrative:  58 year old male with history of anemia, anxiety, end-stage renal disease on dialysis, GERD, diabetes mellitus type 2, left AKA, chronic abdominal pain, inguinal hernia, CAD status post CABG, polymyalgia rheumatica presented on 07/31/2018 with abdominal pain.  He was found to have acute cholecystitis.  General surgery was consulted.  He was started on IV antibiotics.  Assessment & Plan: Acute calculus cholecystitis s/p Lap chole on 08/02/18 General surgery on board Post op care as gen surg Monitor closely  End-stage renal disease on hemodialysis Nephrology on board, Dialysis as per nephrology schedule  CAD with history of CABG Cardiology consulted for cardiology clearance Echo pending Aspirin on hold.  Continue Coreg.  Diabetes mellitus type 2, diet controlled Hemoglobin A1c 5.7.  Continue CBGs with SSI.  Hypertension Monitor blood pressure.  Continue Coreg and amlodipine.  Anemia of chronic kidney disease  Hemoglobin stable.  Monitor  Thrombocytopenia Questionable cause.  Stable.  No signs of bleeding  Inguinal hernias Outpatient elective repair as per general surgery.     DVT prophylaxis: As per general surgery Code Status: Full Family Communication: Discussed with patient Disposition Plan: As per surgery  Consultants: General surgery/cardiology/nephrology Procedures: Lap chole on 08/02/2018  Antimicrobials: Zosyn from 07/31/2018, discontinued   Subjective: Denies any significant pain post op. Denies any chest pain, SOB, fever/chills  Objective: Vitals:   08/02/18 0814 08/02/18 1104 08/02/18 1119 08/02/18 1134  BP: (!) 165/93 115/75 129/77 134/86  Pulse: 81 (!) 56 60 60  Resp:  12 13 11   Temp:  (!) 97.2 F (36.2 C)    TempSrc:      SpO2:  99%  99% 96%  Weight:      Height:        Intake/Output Summary (Last 24 hours) at 08/02/2018 1811 Last data filed at 08/02/2018 1500 Gross per 24 hour  Intake 648.31 ml  Output 0 ml  Net 648.31 ml   Filed Weights   07/31/18 2301 08/01/18 0849 08/01/18 2015  Weight: 73.1 kg 74.7 kg 74.7 kg    Examination:  General: NAD   Cardiovascular: S1, S2 present  Respiratory: CTAB  Abdomen: Soft, nontender, nondistended, bowel sounds present  Musculoskeletal: No bilateral pedal edema noted, Left AKA  Skin: Normal  Psychiatry: Normal mood      Data Reviewed: I have personally reviewed following labs and imaging studies  CBC: Recent Labs  Lab 07/31/18 1221 08/01/18 0019 08/02/18 0602  WBC 4.7 4.1 3.1*  NEUTROABS 3.2  --  1.8  HGB 10.5* 10.1* 10.0*  HCT 33.3* 32.8* 31.5*  MCV 97.7 97.3 95.5  PLT 124* 125* 88*   Basic Metabolic Panel: Recent Labs  Lab 07/31/18 1221 08/01/18 0019 08/02/18 0602  NA 140 140 134*  K 4.6 4.8 3.9  CL 101 101 96*  CO2 26 28 26   GLUCOSE 132* 90 81  BUN 25* 28* 16  CREATININE 7.41* 7.73* 5.48*  CALCIUM 9.5 9.6 8.9  MG  --  2.2 2.1  PHOS  --  4.8*  --    GFR: Estimated Creatinine Clearance: 15.3 mL/min (A) (by C-G formula based on SCr of 5.48 mg/dL (H)). Liver Function Tests: Recent Labs  Lab 07/31/18 1221 08/01/18 0019 08/02/18 0602  AST 16 15 12*  ALT 11 11 9   ALKPHOS 88 78 69  BILITOT 1.1 0.9 0.7  PROT 7.1 6.9 6.3*  ALBUMIN 3.7 3.5 3.1*   Recent Labs  Lab 07/31/18 1221  LIPASE 26   No results for input(s): AMMONIA in the last 168 hours. Coagulation Profile: No results for input(s): INR, PROTIME in the last 168 hours. Cardiac Enzymes: No results for input(s): CKTOTAL, CKMB, CKMBINDEX, TROPONINI in the last 168 hours. BNP (last 3 results) No results for input(s): PROBNP in the last 8760 hours. HbA1C: Recent Labs    08/01/18 0019  HGBA1C 5.7*   CBG: Recent Labs  Lab 08/02/18 0409 08/02/18 0729 08/02/18 0928  08/02/18 1107 08/02/18 1617  GLUCAP 84 70 72 104* 181*   Lipid Profile: No results for input(s): CHOL, HDL, LDLCALC, TRIG, CHOLHDL, LDLDIRECT in the last 72 hours. Thyroid Function Tests: Recent Labs    08/01/18 0019  TSH 3.359   Anemia Panel: No results for input(s): VITAMINB12, FOLATE, FERRITIN, TIBC, IRON, RETICCTPCT in the last 72 hours. Sepsis Labs: Recent Labs  Lab 07/31/18 1245 08/01/18 0019  LATICACIDVEN 1.1 1.2    Recent Results (from the past 240 hour(s))  C difficile quick scan w PCR reflex     Status: None   Collection Time: 07/26/18  3:53 PM   Specimen: STOOL  Result Value Ref Range Status   C Diff antigen NEGATIVE NEGATIVE Final   C Diff toxin NEGATIVE NEGATIVE Final   C Diff interpretation No C. difficile detected.  Final    Comment: Performed at [facility], [place] [address], [place], [place] [address]  Gastrointestinal Panel by PCR , Stool     Status: None   Collection Time: 07/26/18  3:53 PM   Specimen: STOOL  Result Value Ref Range Status   Campylobacter species NOT DETECTED NOT DETECTED Final   Plesimonas shigelloides NOT DETECTED NOT DETECTED Final   Salmonella species NOT DETECTED NOT DETECTED Final   Yersinia enterocolitica NOT DETECTED NOT DETECTED Final   Vibrio species NOT DETECTED NOT DETECTED Final   Vibrio cholerae NOT DETECTED NOT DETECTED Final   Enteroaggregative E coli (EAEC) NOT DETECTED NOT DETECTED Final   Enteropathogenic E coli (EPEC) NOT DETECTED NOT DETECTED Final   Enterotoxigenic E coli (ETEC) NOT DETECTED NOT DETECTED Final   Shiga like toxin producing E coli (STEC) NOT DETECTED NOT DETECTED Final   Shigella/Enteroinvasive E coli (EIEC) NOT DETECTED NOT DETECTED Final   Cryptosporidium NOT DETECTED NOT DETECTED Final   Cyclospora cayetanensis NOT DETECTED NOT DETECTED Final   Entamoeba histolytica NOT DETECTED NOT DETECTED Final   Giardia lamblia NOT DETECTED NOT DETECTED Final   Adenovirus F40/41 NOT DETECTED NOT  DETECTED Final   Astrovirus NOT DETECTED NOT DETECTED Final   Norovirus GI/GII NOT DETECTED NOT DETECTED Final   Rotavirus A NOT DETECTED NOT DETECTED Final   Sapovirus (I, II, IV, and V) NOT DETECTED NOT DETECTED Final    Comment: Performed at [facility], [address] [place], [place] [address]  SARS Coronavirus 2 (CEPHEID - Performed in [facility] lab), Hosp Order     Status: None   Collection Time: 07/31/18  8:41 PM   Specimen: Nasopharyngeal Swab  Result Value Ref Range Status   SARS Coronavirus 2 NEGATIVE NEGATIVE Final    Comment: (NOTE) If result is NEGATIVE SARS-CoV-2 target nucleic acids are NOT DETECTED. The SARS-CoV-2 RNA is generally detectable in upper and lower  respiratory specimens during the acute phase of infection. The lowest  concentration of SARS-CoV-2 viral copies this  assay can detect is 250  copies / mL. A negative result does not preclude SARS-CoV-2 infection  and should not be used as the sole basis for treatment or other  patient management decisions.  A negative result may occur with  improper specimen collection / handling, submission of specimen other  than nasopharyngeal swab, presence of viral mutation(s) within the  areas targeted by this assay, and inadequate number of viral copies  (<250 copies / mL). A negative result must be combined with clinical  observations, patient history, and epidemiological information. If result is POSITIVE SARS-CoV-2 target nucleic acids are DETECTED. The SARS-CoV-2 RNA is generally detectable in upper and lower  respiratory specimens dur ing the acute phase of infection.  Positive  results are indicative of active infection with SARS-CoV-2.  Clinical  correlation with patient history and other diagnostic information is  necessary to determine patient infection status.  Positive results do  not rule out bacterial infection or co-infection with other viruses. If result is PRESUMPTIVE POSTIVE  SARS-CoV-2 nucleic acids MAY BE PRESENT.   A presumptive positive result was obtained on the submitted specimen  and confirmed on repeat testing.  While 2019 novel coronavirus  (SARS-CoV-2) nucleic acids may be present in the submitted sample  additional confirmatory testing may be necessary for epidemiological  and / or clinical management purposes  to differentiate between  SARS-CoV-2 and other Sarbecovirus currently known to infect humans.  If clinically indicated additional testing with an alternate test  methodology [phone]) is advised. The SARS-CoV-2 RNA is generally  detectable in upper and lower respiratory sp ecimens during the acute  phase of infection. The expected result is Negative. Fact Sheet for Patients:  StrictlyIdeas.no Fact Sheet for Healthcare Providers: BankingDealers.co.za This test is not yet approved or cleared by the [place] FDA and has been authorized for detection and/or diagnosis of SARS-CoV-2 by FDA under an Emergency Use Authorization (EUA).  This EUA will remain in effect (meaning this test can be used) for the duration of the COVID-19 declaration under Section 564(b)(1) of the Act, 21 U.S.C. section 360bbb-3(b)(1), unless the authorization is terminated or revoked sooner. Performed at [facility], [place] [address] [place], [place] [address]   Surgical pcr screen     Status: None   Collection Time: 07/31/18 11:09 PM   Specimen: Nasal Mucosa; Nasal Swab  Result Value Ref Range Status   MRSA, PCR NEGATIVE NEGATIVE Final   Staphylococcus aureus NEGATIVE NEGATIVE Final    Comment: (NOTE) The Xpert SA Assay (FDA approved for NASAL specimens in patients 54 years of age and older), is one component of a comprehensive surveillance program. It is not intended to diagnose infection nor to guide or monitor treatment. Performed at [facility], [place] [address], [place], [place] [address]    Culture, blood (routine x 2)     Status: None (Preliminary result)   Collection Time: 08/01/18  8:23 AM   Specimen: BLOOD RIGHT ARM  Result Value Ref Range Status   Specimen Description BLOOD RIGHT ARM  Final   Special Requests   Final    BOTTLES DRAWN AEROBIC AND ANAEROBIC Blood Culture adequate volume   Culture   Final    NO GROWTH 1 DAY Performed at [facility], [place] [address], [place], [place] [address]    Report Status PENDING  Incomplete  Culture, blood (routine x 2)     Status: None (Preliminary result)   Collection Time: 08/01/18  8:31  AM   Specimen: BLOOD RIGHT HAND  Result Value Ref Range Status   Specimen Description BLOOD RIGHT HAND  Final   Special Requests   Final    BOTTLES DRAWN AEROBIC ONLY Blood Culture results may not be optimal due to an inadequate volume of blood received in culture bottles   Culture   Final    NO GROWTH 1 DAY Performed at [place] [address], [place], [place] [address]    Report Status PENDING  Incomplete         Radiology Studies: Dg Cholangiogram Operative  Result Date: 08/02/2018 CLINICAL DATA:  Intraoperative cholangiogram during laparoscopic cholecystectomy. EXAM: INTRAOPERATIVE CHOLANGIOGRAM FLUOROSCOPY TIME:  10 seconds COMPARISON:  Right upper quadrant abdominal ultrasound-07/31/2018 FINDINGS: A single spot intraoperative cholangiographic image of the right upper abdominal quadrant during laparoscopic cholecystectomy are provided for review. Surgical clips overlie the expected location of the gallbladder fossa. Contrast injection demonstrates selective cannulation of the central aspect of the cystic duct. There is passage of contrast through the central aspect of the cystic duct with filling of a non dilated common bile duct. There is passage of contrast though the CBD and into the descending portion of the duodenum. There is minimal reflux of injected contrast into the common hepatic duct and central aspect of the  non dilated intrahepatic biliary system. There are no discrete filling defects within the opacified portions of the biliary system to suggest the presence of choledocholithiasis. IMPRESSION: No evidence of choledocholithiasis. Electronically Signed   By: Sandi Mariscal M.D.   On: 08/02/2018 12:51        Scheduled Meds: . acetaminophen  1,000 mg Oral Q6H  . amLODipine  5 mg Oral Daily  . carvedilol  12.5 mg Oral BID WC  . Chlorhexidine Gluconate Cloth  6 each Topical Q0600  . [START ON 08/03/2018] darbepoetin (ARANESP) injection - DIALYSIS  60 mcg Intravenous Q Thu-HD  . insulin aspart  0-9 Units Subcutaneous Q4H  . sodium chloride flush  3 mL Intravenous Q12H   Continuous Infusions: . sodium chloride       LOS: 2 days        Alma Friendly, MD [facility] 08/02/2018, 6:11 PM

## 2018-08-02 NOTE — Op Note (Signed)
Laparoscopic Cholecystectomy with IOC Procedure Note  Indications: This patient presents with symptomatic gallbladder disease and will undergo laparoscopic cholecystectomy.  He has multiple medical comorbidities but has been cleared by cardiology for surgery.  Pre-operative Diagnosis: Calculus of gallbladder with acute cholecystitis, without mention of obstruction  Post-operative Diagnosis: Same  Surgeon: Maia Petties   Assistants: Saverio Danker, PA-C  Anesthesia: General endotracheal anesthesia  ASA Class: 2  Procedure Details  The patient was seen again in the Holding Room. The risks, benefits, complications, treatment options, and expected outcomes were discussed with the patient. The possibilities of reaction to medication, pulmonary aspiration, perforation of viscus, bleeding, recurrent infection, finding a normal gallbladder, the need for additional procedures, failure to diagnose a condition, the possible need to convert to an open procedure, and creating a complication requiring transfusion or operation were discussed with the patient. The likelihood of improving the patient's symptoms with return to their baseline status is good.  The patient and/or family concurred with the proposed plan, giving informed consent. The site of surgery properly noted. The patient was taken to Operating Room, identified as Matthew Kline and the procedure verified as Laparoscopic Cholecystectomy with Intraoperative Cholangiogram. A Time Out was held and the above information confirmed.  Prior to the induction of general anesthesia, antibiotic prophylaxis was administered. General endotracheal anesthesia was then administered and tolerated well. After the induction, the abdomen was prepped with Chloraprep and draped in the sterile fashion. The patient was positioned in the supine position.  Local anesthetic agent was injected into the skin below the umbilicus and an incision made. We dissected down  to the abdominal fascia with blunt dissection.  The fascia was incised vertically and we entered the peritoneal cavity bluntly.  A pursestring suture of 0-Vicryl was placed around the fascial opening.  The Hasson cannula was inserted and secured with the stay suture.  Pneumoperitoneum was then created with CO2 and tolerated well without any adverse changes in the patient's vital signs. An 11-mm port was placed in the subxiphoid position.  Two 5-mm ports were placed in the right upper quadrant. All skin incisions were infiltrated with a local anesthetic agent before making the incision and placing the trocars.   We positioned the patient in reverse Trendelenburg, tilted slightly to the patient's left. He has significant scarring of the omentum to the edge of the liver.  We used cautery and blunt dissection to peel the omentum away from the liver and the gallbladder fundus. The gallbladder was identified, the fundus grasped and retracted cephalad. Adhesions were lysed bluntly and with the electrocautery where indicated, taking care not to injure any adjacent organs or viscus. The infundibulum was grasped and retracted laterally, exposing the peritoneum overlying the triangle of Calot. This was then divided and exposed in a blunt fashion. A critical view of the cystic duct and cystic artery was obtained.  The cystic duct was clearly identified and bluntly dissected circumferentially. The cystic duct was ligated with a clip distally.   An incision was made in the cystic duct and the [facility] cholangiogram catheter introduced. The catheter was secured using a clip. A cholangiogram was then obtained which showed good visualization of the distal and proximal biliary tree with no sign of filling defects or obstruction.  Contrast flowed easily into the duodenum. The catheter was then removed.   The cystic duct was then ligated with clips and divided. The cystic artery was identified, dissected free, ligated with clips and  divided as well.  The gallbladder was dissected from the liver bed in retrograde fashion with the electrocautery. The gallbladder was removed and placed in an Eco sac. The liver bed was irrigated and inspected. Hemostasis was achieved with the electrocautery and Surgicel SNOW. Copious irrigation was utilized and was repeatedly aspirated until clear.  The gallbladder and Eco sac were then removed through the umbilical port site.  The pursestring suture was used to close the umbilical fascia.    We again inspected the right upper quadrant for hemostasis.  Pneumoperitoneum was released as we removed the trocars.  4-0 Monocryl was used to close the skin.   Benzoin, steri-strips, and clean dressings were applied. The patient was then extubated and brought to the recovery room in stable condition. Instrument, sponge, and needle counts were correct at closure and at the conclusion of the case.   Findings: Cholecystitis with Cholelithiasis  Estimated Blood Loss: less than 50 mL         Drains: none         Specimens: Gallbladder           Complications: None; patient tolerated the procedure well.         Disposition: PACU - hemodynamically stable.         Condition: stable  Imogene Burn. Georgette Dover, MD, [facility] [phone]  08/02/2018 10:52 AM

## 2018-08-02 NOTE — Progress Notes (Addendum)
[facility] Progress Note   Subjective:   Patient seen and examined at bedside post op.  A little sore and groggy but overall doing ok.  Denies SOB and edema.    Objective Vitals:   08/02/18 0814 08/02/18 1104 08/02/18 1119 08/02/18 1134  BP: (!) 165/93 115/75 129/77 134/86  Pulse: 81 (!) 56 60 60  Resp:  12 13 11   Temp:  (!) 97.2 F (36.2 C)    TempSrc:      SpO2:  99% 99% 96%  Weight:      Height:       Physical Exam General:NAD, chronically ill appearing male, laying in bed Heart:RRR, no mrg Lungs:CTAB Abdomen:soft, NTND Extremities:L AKA - no stump edema, RLE no edema Dialysis Access: LU AVF +b   Filed Weights   07/31/18 2301 08/01/18 0849 08/01/18 2015  Weight: 73.1 kg 74.7 kg 74.7 kg    Intake/Output Summary (Last 24 hours) at 08/02/2018 1350 Last data filed at 08/02/2018 1100 Gross per 24 hour  Intake 1180 ml  Output 0 ml  Net 1180 ml    Additional Objective Labs: Basic Metabolic Panel: Recent Labs  Lab 07/31/18 1221 08/01/18 0019 08/02/18 0602  NA 140 140 134*  K 4.6 4.8 3.9  CL 101 101 96*  CO2 26 28 26   GLUCOSE 132* 90 81  BUN 25* 28* 16  CREATININE 7.41* 7.73* 5.48*  CALCIUM 9.5 9.6 8.9  PHOS  --  4.8*  --    Liver Function Tests: Recent Labs  Lab 07/31/18 1221 08/01/18 0019 08/02/18 0602  AST 16 15 12*  ALT 11 11 9   ALKPHOS 88 78 69  BILITOT 1.1 0.9 0.7  PROT 7.1 6.9 6.3*  ALBUMIN 3.7 3.5 3.1*   Recent Labs  Lab 07/31/18 1221  LIPASE 26   CBC: Recent Labs  Lab 07/31/18 1221 08/01/18 0019 08/02/18 0602  WBC 4.7 4.1 3.1*  NEUTROABS 3.2  --  1.8  HGB 10.5* 10.1* 10.0*  HCT 33.3* 32.8* 31.5*  MCV 97.7 97.3 95.5  PLT 124* 125* 88*   Blood Culture    Component Value Date/Time   SDES BLOOD RIGHT HAND 08/01/2018 0831   SPECREQUEST  08/01/2018 0831    BOTTLES DRAWN AEROBIC ONLY Blood Culture results may not be optimal due to an inadequate volume of blood received in culture bottles   CULT  08/01/2018 0831     NO GROWTH 1 DAY Performed at [facility], [address], [place], [place] [address]    REPTSTATUS PENDING 08/01/2018 0831   CBG: Recent Labs  Lab 08/02/18 0000 08/02/18 0409 08/02/18 0729 08/02/18 0928 08/02/18 1107  GLUCAP 94 84 70 72 104*   Studies/Results: Dg Cholangiogram Operative  Result Date: 08/02/2018 CLINICAL DATA:  Intraoperative cholangiogram during laparoscopic cholecystectomy. EXAM: INTRAOPERATIVE CHOLANGIOGRAM FLUOROSCOPY TIME:  10 seconds COMPARISON:  Right upper quadrant abdominal ultrasound-07/31/2018 FINDINGS: A single spot intraoperative cholangiographic image of the right upper abdominal quadrant during laparoscopic cholecystectomy are provided for review. Surgical clips overlie the expected location of the gallbladder fossa. Contrast injection demonstrates selective cannulation of the central aspect of the cystic duct. There is passage of contrast through the central aspect of the cystic duct with filling of a non dilated common bile duct. There is passage of contrast though the CBD and into the descending portion of the duodenum. There is minimal reflux of injected contrast into the common hepatic duct and central aspect of the non dilated intrahepatic biliary system. There  are no discrete filling defects within the opacified portions of the biliary system to suggest the presence of choledocholithiasis. IMPRESSION: No evidence of choledocholithiasis. Electronically Signed   By: Sandi Mariscal M.D.   On: 08/02/2018 12:51   US Abdomen Limited Ruq  Result Date: 07/31/2018 CLINICAL DATA:  Upper abdominal pain several months worse today. EXAM: ULTRASOUND ABDOMEN LIMITED RIGHT UPPER QUADRANT COMPARISON:  HIDA scan 07/10/2018 and CT 07/09/2018 FINDINGS: Gallbladder: Moderate cholelithiasis with largest stone measuring 1.4 cm. Positive sonographic Murphy sign with mild adjacent free fluid. Edematous gallbladder wall thickening measuring 7.2 mm which is nonspecific in the  setting of mild ascites. Findings may be due to acute cholecystitis, although note the patient had a negative HIDA scan for acute cholecystitis 07/10/2018. Common bile duct: Diameter: 4.7 mm. Liver: No focal lesion identified. Within normal limits in parenchymal echogenicity. Portal vein is patent on color Doppler imaging with normal direction of blood flow towards the liver. IMPRESSION: Moderate cholelithiasis with positive sonographic Murphy sign. Nonspecific gallbladder wall thickening of 7.2 mm in the setting of mild ascites. Findings may be due to acute cholecystitis. Electronically Signed   By: Marin Olp M.D.   On: 07/31/2018 17:19    Medications: . sodium chloride     . acetaminophen  1,000 mg Oral Q6H  . amLODipine  5 mg Oral Daily  . carvedilol  12.5 mg Oral BID WC  . Chlorhexidine Gluconate Cloth  6 each Topical Q0600  . fentaNYL      . insulin aspart  0-9 Units Subcutaneous Q4H  . sodium chloride flush  3 mL Intravenous Q12H    Dialysis Orders: MWF - [facility]  4hrs, BFR 400, DFR AF2.0,  EDW 70.5kg, 2K/ 2Ca  Access: LU AVF  Heparin 4000 unit bolus Mircera 75 mcg q2wks - last 6/23 Hectorol 67mcg IV qHD   Assessment/Plan: 1. Acute cholecystitis - cholecystectomy this AM.  Per surgery 2.  ESRD -  On HD MWF. K3.9. Plan for HD off schedule tomorrow d/t surgery today.  Will resume regular schedule likely Friday, if not Monday.  No Heparin post op.  3.  Hypertension/volume  - BP well controlled.  Net UF w/HD yesterday 3L. Still over dry, +LE edema, plan for UF 3-4L tomorrow.  4.  Anemia of CKD - Hgb 10.0, ESA with HD tomorrow, follow trends anticipate drop post surgery.  5.  Secondary Hyperparathyroidism -  Ca and Phos at goal. Continue VDRA and binders once eating. 6.  Nutrition - Currently NPO.  Renal diet once advanced. 7. CAD s/p CABG 8. PAD  9. DMT2 10. Inguinal hernias - Surgery consulted during last admission, will need elective repair   Jen Mow, PA-C [facility] Pager: [phone] 08/02/2018,1:50 PM  LOS: 2 days   Pt seen, examined and agree w A/P as above.  Kelly Splinter  MD 08/02/2018, 3:39 PM

## 2018-08-02 NOTE — Anesthesia Postprocedure Evaluation (Signed)
Anesthesia Post Note  Patient: Matthew Kline  Procedure(s) Performed: LAPAROSCOPIC CHOLECYSTECTOMY WITH INTRAOPERATIVE CHOLANGIOGRAM (N/A Abdomen)     Patient location during evaluation: PACU Anesthesia Type: General Level of consciousness: awake and alert Pain management: pain level controlled Vital Signs Assessment: post-procedure vital signs reviewed and stable Respiratory status: spontaneous breathing, nonlabored ventilation, respiratory function stable and patient connected to nasal cannula oxygen Cardiovascular status: blood pressure returned to baseline and stable Postop Assessment: no apparent nausea or vomiting Anesthetic complications: no    Last Vitals:  Vitals:   08/02/18 1119 08/02/18 1134  BP: 129/77 134/86  Pulse: 60 60  Resp: 13 11  Temp:    SpO2: 99% 96%    Last Pain:  Vitals:   08/02/18 1134  TempSrc:   PainSc: Asleep                 Sascha Baugher COKER

## 2018-08-02 NOTE — Anesthesia Procedure Notes (Signed)
Procedure Name: Intubation Date/Time: 08/02/2018 9:42 AM Performed by: Moshe Salisbury, CRNA Pre-anesthesia Checklist: Patient identified, Emergency Drugs available, Suction available and Patient being monitored Patient Re-evaluated:Patient Re-evaluated prior to induction Oxygen Delivery Method: Circle System Utilized Preoxygenation: Pre-oxygenation with 100% oxygen Induction Type: IV induction Ventilation: Mask ventilation without difficulty Laryngoscope Size: Mac and 4 Grade View: Grade II Tube type: Oral Tube size: 8.0 mm Number of attempts: 1 Airway Equipment and Method: Stylet Placement Confirmation: ETT inserted through vocal cords under direct vision,  positive ETCO2 and breath sounds checked- equal and bilateral Secured at: 23 cm Tube secured with: Tape Dental Injury: Teeth and Oropharynx as per pre-operative assessment

## 2018-08-02 NOTE — TOC Initial Note (Addendum)
Transition of Care [facility]) - Initial/Assessment Note    Patient Details  Name: Matthew Kline MRN: [id number] Date of Birth: Dec 05, 1960  [facility]) CM/SW Contact:    Bartholomew Crews, RN Phone Number: [phone] 08/02/2018, 3:29 PM  Clinical Narrative:                 Spoke with patient at bedside. PTA home with daughter. HD MWF outpatient - currently off schedule. S/p lap chole today and will have HD tomorrow. Normally drives himself to and from HD, but states his daughter is available if needed. Discussed recommendations for [facility] PT - patient is agreeable - referral placed to [place] accepted. Patient requests new walker stating his previous one is not stable and was handed down to him. Patient will need HH PT order with [facility].  Referral placed to [facility] for walker. Patient will need DME order for RW. CM to follow for transition of care needs.   Expected Discharge Plan: [place] Barriers to Discharge: Continued Medical Work up   Patient Goals and CMS Choice Patient states their goals for this hospitalization and ongoing recovery are:: home CMS Medicare.gov Compare Post Acute Care list provided to:: Patient Choice offered to / list presented to : Patient  Expected Discharge Plan and Services Expected Discharge Plan: [place] In-house Referral: NA Discharge Planning Services: CM Consult Post Acute Care Choice: Home Health, Durable Medical Equipment Living arrangements for the past 2 months: Single Family Home                 DME Arranged: Walker rolling DME Agency: [facility] Date DME Agency Contacted: 08/02/18 Time DME Agency Contacted: 1528 Representative spoke with at DME Agency: [place]: PT [place] Agency: [place] ([place]) Date [place]: 08/02/18 Time [place]: 1528 Representative spoke with at [place]: Butch Penny  Prior Living Arrangements/Services Living  arrangements for the past 2 months: [place] with:: Self, Adult Children Patient language and need for interpreter reviewed:: Yes Do you feel safe going back to the place where you live?: Yes      Need for Family Participation in Patient Care: Yes (Comment) Care giver support system in place?: Yes (comment) Current home services: DME Criminal Activity/Legal Involvement Pertinent to Current Situation/Hospitalization: No - Comment as needed  Activities of Daily Living Home Assistive Devices/Equipment: Blood pressure cuff, Crutches, CBG Meter, Scales, Shower chair with back, Grab bars in shower, Long-handled shoehorn, Prosthesis, Reacher, Walker (specify type), Wheelchair ADL Screening (condition at time of admission) Patient's cognitive ability adequate to safely complete daily activities?: Yes Is the patient deaf or have difficulty hearing?: No Does the patient have difficulty seeing, even when wearing glasses/contacts?: No Does the patient have difficulty concentrating, remembering, or making decisions?: No Patient able to express need for assistance with ADLs?: Yes Does the patient have difficulty dressing or bathing?: No Independently performs ADLs?: Yes (appropriate for developmental age) Does the patient have difficulty walking or climbing stairs?: No Weakness of Legs: Left(left AKA has prothesis) Weakness of Arms/Hands: None  Permission Sought/Granted                  Emotional Assessment Appearance:: Appears stated age Attitude/Demeanor/Rapport: Engaged Affect (typically observed): Accepting Orientation: : Oriented to Self, Oriented to Place, Oriented to  Time, Oriented to Situation Alcohol / Substance Use: Not Applicable Psych Involvement: No (comment)  Admission diagnosis:  Cholecystitis [K81.9] Upper abdominal pain [  R10.10] Patient Active Problem List   Diagnosis Date Noted  . Biliary colic 34/19/3790  . Acute cholecystitis 07/31/2018  . Prolonged  QT interval 07/31/2018  . Acute respiratory failure with hypoxia ([place]) 07/10/2018  . Abdominal pain 07/10/2018  . Bilateral recurrent inguinal hernia without obstruction or gangrene   . Altered mental status   . Cerebral thrombosis with cerebral infarction 02/06/2017  . Cerebral embolism with cerebral infarction 02/06/2017  . Hx of AKA (above knee amputation), left ([place])   . Pressure injury of skin 01/30/2017  . Acute lower UTI 01/29/2017  . Acute metabolic encephalopathy 24/09/7351  . UTI (urinary tract infection) 01/29/2017  . Quadriceps muscle rupture, left, initial encounter   . Fall 01/09/2017  . Gait disturbance 01/09/2017  . Staphylococcus aureus infection 01/09/2017  . Infection of prosthetic left knee joint ([place]) 01/09/2017  . Fever, unknown origin 11/09/2016  . Critical lower limb ischemia 08/25/2016  . Ulcer of left midfoot with fat layer exposed ([place]) 08/13/2016  . Diabetic ulcer of left midfoot associated with type 2 diabetes mellitus, with fat layer exposed ([place]) 07/25/2016  . Peripheral neuropathy 07/22/2016  . Tobacco abuse 07/22/2016  . CAD in native artery 05/18/2016  . CAD, multiple vessel 05/11/2016  . Positive cardiac stress test 05/11/2016  . Abnormal stress test 04/30/2016  . Pre-transplant evaluation for kidney transplant 04/30/2016  . S/P revision of total knee 11/26/2015  . Pain in the chest   . Acute on chronic diastolic heart failure ([place]) 07/05/2015  . Volume overload 07/04/2015  . Shortness of breath 07/04/2015  . Hypoxemia 07/04/2015  . Elevated troponin   . End-stage renal disease on hemodialysis ([place])   . Hypervolemia   . 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 Pyogenic bacterial arthritis of knee, left ([place]) 08/07/2014  . Tachycardia 07/24/2014  . Acute upper respiratory infection 07/24/2014  . ESRD on dialysis ([place]) 07/14/2014  . Type II diabetes mellitus ([place]) 07/14/2014  . Anemia in chronic kidney disease 07/14/2014  .  Congestive heart failure (CHF) ([place]) 07/13/2014  . Surgical wound dehiscence 05/09/2014  . Dehiscence of closure of skin 05/09/2014  . Total knee replacement status 04/10/2014  . Diabetes mellitus with renal manifestations, controlled ([place]) 10/24/2013  . Hypertensive renal disease 06/27/2013  . DM type 2 causing vascular disease ([place]) 06/27/2013  . Erectile dysfunction 06/27/2013  . Depression 06/27/2013  . Claudication of left lower extremity ([place]) 12/19/2012  . Essential hypertension, benign 12/19/2012  . Sinusitis, acute maxillary 11/22/2012  . Otitis, externa, infective 11/14/2012  . Leg edema, left 11/14/2012  . End stage renal disease ([place]) 10/02/2012  . Controlled type 2 DM with proteinuria or microalbuminuria 09/19/2012  . GERD (gastroesophageal reflux disease) 09/19/2012  . Leukocytosis 09/19/2012  . Lacunar infarction ([place]) 08/17/2012  . Polymyalgia rheumatica ([place]) 08/17/2012  . 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 Essential hypertension 05/10/2012  . Vitamin D deficiency 05/10/2012  . Diabetes mellitus due to underlying condition ([place]) 05/10/2012  . Hyperlipidemia LDL goal <100 05/10/2012  . Anemia of chronic disease 05/10/2012  . Screening for prostate cancer 05/10/2012  . Chronic kidney disease (CKD), stage IV (severe) ([place]) 05/10/2012  . Peripheral autonomic neuropathy due to DM ([place]) 05/10/2012  . Callus of foot 05/10/2012  . Urgency of urination 05/10/2012  . Hyperkalemia 05/10/2012  . Candidiasis of the esophagus 10/12/2011  . Internal hemorrhoids without mention of complication 29/92/4268  . Pre-syncope 07/25/2009  . DJD (degenerative joint disease) of cervical spine 02/17/2009  PCP:  [facility] [place]:   [id number] [place], [place] [address] [place] [address] Phone: [phone] Fax: [phone]     Social Determinants of Health (SDOH)  Interventions    Readmission Risk Interventions No flowsheet data found.

## 2018-08-02 NOTE — Anesthesia Preprocedure Evaluation (Signed)
Anesthesia Evaluation  Patient identified by MRN, date of birth, ID band Patient awake    Reviewed: Allergy & Precautions, NPO status , Patient's Chart, lab work & pertinent test results  Airway Mallampati: II  TM Distance: >3 FB Neck ROM: Full    Dental  (+) Poor Dentition, Dental Advisory Given   Pulmonary former smoker,    breath sounds clear to auscultation + decreased breath sounds      Cardiovascular hypertension,  Rhythm:Regular Rate:Normal     Neuro/Psych    GI/Hepatic   Endo/Other  diabetes  Renal/GU      Musculoskeletal   Abdominal   Peds  Hematology   Anesthesia Other Findings   Reproductive/Obstetrics                             Anesthesia Physical Anesthesia Plan  ASA: III  Anesthesia Plan: General   Post-op Pain Management:    Induction: Intravenous, Rapid sequence and Cricoid pressure planned  PONV Risk Score and Plan: Ondansetron  Airway Management Planned: Oral ETT  Additional Equipment:   Intra-op Plan:   Post-operative Plan: Extubation in OR  Informed Consent: I have reviewed the patients History and Physical, chart, labs and discussed the procedure including the risks, benefits and alternatives for the proposed anesthesia with the patient or authorized representative who has indicated his/her understanding and acceptance.     Dental advisory given  Plan Discussed with: CRNA and Anesthesiologist  Anesthesia Plan Comments:         Anesthesia Quick Evaluation

## 2018-08-03 ENCOUNTER — Encounter (HOSPITAL_COMMUNITY): Payer: Self-pay | Admitting: Surgery

## 2018-08-03 DIAGNOSIS — N186 End stage renal disease: Secondary | ICD-10-CM | POA: Diagnosis not present

## 2018-08-03 DIAGNOSIS — I2510 Atherosclerotic heart disease of native coronary artery without angina pectoris: Secondary | ICD-10-CM | POA: Diagnosis not present

## 2018-08-03 DIAGNOSIS — K810 Acute cholecystitis: Secondary | ICD-10-CM | POA: Diagnosis not present

## 2018-08-03 DIAGNOSIS — I10 Essential (primary) hypertension: Secondary | ICD-10-CM | POA: Diagnosis not present

## 2018-08-03 LAB — BASIC METABOLIC PANEL
Anion gap: 15 (ref 5–15)
BUN: 23 mg/dL — ABNORMAL HIGH (ref 6–20)
CO2: 24 mmol/L (ref 22–32)
Calcium: 9.3 mg/dL (ref 8.9–10.3)
Chloride: 95 mmol/L — ABNORMAL LOW (ref 98–111)
Creatinine, Ser: 6.5 mg/dL — ABNORMAL HIGH (ref 0.61–1.24)
GFR calc Af Amer: 10 mL/min — ABNORMAL LOW (ref >60)
GFR calc non Af Amer: 9 mL/min — ABNORMAL LOW (ref >60)
Glucose, Bld: 103 mg/dL — ABNORMAL HIGH (ref 70–99)
Potassium: 4.3 mmol/L (ref 3.5–5.1)
Sodium: 134 mmol/L — ABNORMAL LOW (ref 135–145)

## 2018-08-03 LAB — CBC
HCT: 33 % — ABNORMAL LOW (ref 39.0–52.0)
Hemoglobin: 10.5 g/dL — ABNORMAL LOW (ref 13.0–17.0)
MCH: 30.3 pg (ref 26.0–34.0)
MCHC: 31.8 g/dL (ref 30.0–36.0)
MCV: 95.4 fL (ref 80.0–100.0)
Platelets: 91 10*3/uL — ABNORMAL LOW (ref 150–400)
RBC: 3.46 MIL/uL — ABNORMAL LOW (ref 4.22–5.81)
RDW: 14.3 % (ref 11.5–15.5)
WBC: 6 10*3/uL (ref 4.0–10.5)
nRBC: 0 % (ref 0.0–0.2)

## 2018-08-03 LAB — GLUCOSE, CAPILLARY
Glucose-Capillary: 101 mg/dL — ABNORMAL HIGH (ref 70–99)
Glucose-Capillary: 107 mg/dL — ABNORMAL HIGH (ref 70–99)
Glucose-Capillary: 140 mg/dL — ABNORMAL HIGH (ref 70–99)
Glucose-Capillary: 149 mg/dL — ABNORMAL HIGH (ref 70–99)
Glucose-Capillary: 72 mg/dL (ref 70–99)
Glucose-Capillary: 87 mg/dL (ref 70–99)
Glucose-Capillary: 89 mg/dL (ref 70–99)
Glucose-Capillary: 93 mg/dL (ref 70–99)

## 2018-08-03 MED ORDER — HYDROXYZINE HCL 25 MG PO TABS: 50.0000 mg | ORAL_TABLET | Freq: Three times a day (TID) | ORAL | Status: DC | PRN

## 2018-08-03 MED ORDER — SODIUM CHLORIDE 0.9 % IV SOLN: 100.0000 mL | INTRAVENOUS | Status: DC | PRN

## 2018-08-03 MED ORDER — LIDOCAINE-PRILOCAINE 2.5-2.5 % EX CREA: 1.0000 "application " | TOPICAL_CREAM | CUTANEOUS | Status: DC | PRN

## 2018-08-03 MED ORDER — LIDOCAINE HCL (PF) 1 % IJ SOLN: 5.0000 mL | INTRAMUSCULAR | Status: DC | PRN

## 2018-08-03 MED ORDER — HEPARIN SODIUM (PORCINE) 1000 UNIT/ML DIALYSIS: 1000.0000 [IU] | INTRAMUSCULAR | Status: DC | PRN

## 2018-08-03 MED ORDER — HYDROXYZINE HCL 10 MG PO TABS
10.0000 mg | ORAL_TABLET | Freq: Two times a day (BID) | ORAL | Status: DC | PRN
  Administered 2018-08-03: 10 mg via ORAL
  Filled 2018-08-03 (×2): qty 1

## 2018-08-03 MED ORDER — HEPARIN SODIUM (PORCINE) 1000 UNIT/ML DIALYSIS: 4000.0000 [IU] | INTRAMUSCULAR | Status: DC | PRN

## 2018-08-03 MED ORDER — HYDROXYZINE HCL 25 MG PO TABS
50.0000 mg | ORAL_TABLET | Freq: Two times a day (BID) | ORAL | Status: DC | PRN
  Administered 2018-08-03 – 2018-08-04 (×2): 50 mg via ORAL
  Filled 2018-08-03 (×2): qty 2

## 2018-08-03 MED ORDER — CHLORHEXIDINE GLUCONATE CLOTH 2 % EX PADS: 6.0000 | MEDICATED_PAD | Freq: Every day | CUTANEOUS | Status: DC

## 2018-08-03 MED ORDER — HYDROXYZINE HCL 25 MG PO TABS
ORAL_TABLET | ORAL | Status: AC
  Administered 2018-08-03: 10 mg
  Filled 2018-08-03: qty 1

## 2018-08-03 MED ORDER — ALTEPLASE 2 MG IJ SOLR: 2.0000 mg | Freq: Once | INTRAMUSCULAR | Status: DC | PRN

## 2018-08-03 MED ORDER — PENTAFLUOROPROP-TETRAFLUOROETH EX AERO: 1.0000 "application " | INHALATION_SPRAY | CUTANEOUS | Status: DC | PRN

## 2018-08-03 NOTE — Plan of Care (Signed)
  Problem: Education: Goal: Knowledge of General Education information will improve Description: Including pain rating scale, medication(s)/side effects and non-pharmacologic comfort measures Outcome: Progressing   Problem: Clinical Measurements: Goal: Ability to maintain clinical measurements within normal limits will improve Outcome: Progressing   

## 2018-08-03 NOTE — Progress Notes (Signed)
PT Cancellation Note  Patient Details Name: Matthew Kline MRN: [id number] DOB: 11-Dec-1960   Cancelled Treatment:     patient off the unit for HD.   Reinaldo Berber, PT, DPT Acute Rehabilitation Services Pager: [phone] Office: [phone]     Reinaldo Berber 08/03/2018, 9:45 AM

## 2018-08-03 NOTE — Discharge Instructions (Signed)
[facility]  Please arrive at least 30 min before your appointment to complete your check in paperwork.  If you are unable to arrive 30 min prior to your appointment time we may have to cancel or reschedule you. LAPAROSCOPIC SURGERY: POST OP INSTRUCTIONS Always review your discharge instruction sheet given to you by the facility where your surgery was performed. IF YOU HAVE DISABILITY OR FAMILY LEAVE FORMS, YOU MUST BRING THEM TO THE OFFICE FOR PROCESSING.   DO NOT GIVE THEM TO YOUR DOCTOR.  PAIN CONTROL  1. First take acetaminophen (Tylenol) AND/or ibuprofen (Advil) to control your pain after surgery.  Follow directions on package.  Taking acetaminophen (Tylenol) and/or ibuprofen (Advil) regularly after surgery will help to control your pain and lower the amount of prescription pain medication you may need.  You should not take more than 4,000 mg (4 grams) of acetaminophen (Tylenol) in 24 hours.  You should not take ibuprofen (Advil), aleve, motrin, naprosyn or other NSAIDS if you have a history of stomach ulcers or chronic kidney disease.  2. A prescription for pain medication may be given to you upon discharge.  Take your pain medication as prescribed, if you still have uncontrolled pain after taking acetaminophen (Tylenol) or ibuprofen (Advil). 3. Use ice packs to help control pain. 4. If you need a refill on your pain medication, please contact your pharmacy.  They will contact our office to request authorization. Prescriptions will not be filled after 5pm or on week-ends.  HOME MEDICATIONS 5. Take your usually prescribed medications unless otherwise directed.  DIET 6. You should follow a light diet the first few days after arrival home.  Be sure to include lots of fluids daily. Avoid fatty, fried foods.   CONSTIPATION 7. It is common to experience some constipation after surgery and if you are taking pain medication.  Increasing fluid intake and taking a stool  softener (such as Colace) will usually help or prevent this problem from occurring.  A mild laxative (Milk of Magnesia or Miralax) should be taken according to package instructions if there are no bowel movements after 48 hours.  WOUND/INCISION CARE 8. Most patients will experience some swelling and bruising in the area of the incisions.  Ice packs will help.  Swelling and bruising can take several days to resolve.  9. Unless discharge instructions indicate otherwise, follow guidelines below  a. STERI-STRIPS - you may remove your outer bandages 48 hours after surgery, and you may shower at that time.  You have steri-strips (small skin tapes) in place directly over the incision.  These strips should be left on the skin for 7-10 days.   b. DERMABOND/SKIN GLUE - you may shower in 24 hours.  The glue will flake off over the next 2-3 weeks. 10. Any sutures or staples will be removed at the office during your follow-up visit.  ACTIVITIES 11. You may resume regular (light) daily activities beginning the next day--such as daily self-care, walking, climbing stairs--gradually increasing activities as tolerated.  You may have sexual intercourse when it is comfortable.  Refrain from any heavy lifting or straining until approved by your doctor. a. You may drive when you are no longer taking prescription pain medication, you can comfortably wear a seatbelt, and you can safely maneuver your car and apply brakes.  FOLLOW-UP 12. You should see your doctor in the office for a follow-up appointment approximately 2-3 weeks after your surgery.  You should have been given your post-op/follow-up appointment when   your surgery was scheduled.  If you did not receive a post-op/follow-up appointment, make sure that you call for this appointment within a day or two after you arrive home to insure a convenient appointment time.   WHEN TO CALL YOUR DOCTOR: 1. Fever over 101.0 2. Inability to urinate 3. Continued bleeding from  incision. 4. Increased pain, redness, or drainage from the incision. 5. Increasing abdominal pain  The clinic staff is available to answer your questions during regular business hours.  Please don't hesitate to call and ask to speak to one of the nurses for clinical concerns.  If you have a medical emergency, go to the nearest emergency room or call 911.  A surgeon from [facility] is always on call at the hospital. [address], [address], [place], [place]  [address] ? [address] [address], [place], [place]   [address] [phone] ? [phone] ? [phone] [phone]  .........   Managing Your Pain After Surgery Without Opioids    Thank you for participating in our program to help patients manage their pain after surgery without opioids. This is part of our effort to provide you with the best care possible, without exposing you or your family to the risk that opioids pose.  What pain can I expect after surgery? You can expect to have some pain after surgery. This is normal. The pain is typically worse the day after surgery, and quickly begins to get better. Many studies have found that many patients are able to manage their pain after surgery with Over-the-Counter (OTC) medications such as Tylenol and Motrin. If you have a condition that does not allow you to take Tylenol or Motrin, notify your surgical team.  How will I manage my pain? The best strategy for controlling your pain after surgery is around the clock pain control with Tylenol (acetaminophen) and Motrin (ibuprofen or Advil). Alternating these medications with each other allows you to maximize your pain control. In addition to Tylenol and Motrin, you can use heating pads or ice packs on your incisions to help reduce your pain.  How will I alternate your regular strength over-the-counter pain medication? You will take a dose of pain medication every three hours. ; Start by taking 650 mg of Tylenol (2 pills of 325  mg) ; 3 hours later take 600 mg of Motrin (3 pills of 200 mg) ; 3 hours after taking the Motrin take 650 mg of Tylenol ; 3 hours after that take 600 mg of Motrin.   - 1 -  See example - if your first dose of Tylenol is at 12:00 PM   12:00 PM Tylenol 650 mg (2 pills of 325 mg)  3:00 PM Motrin 600 mg (3 pills of 200 mg)  6:00 PM Tylenol 650 mg (2 pills of 325 mg)  9:00 PM Motrin 600 mg (3 pills of 200 mg)  Continue alternating every 3 hours   We recommend that you follow this schedule around-the-clock for at least 3 days after surgery, or until you feel that it is no longer needed. Use the table on the last page of this handout to keep track of the medications you are taking. Important: Do not take more than 3000mg of Tylenol or 3200mg of Motrin in a 24-hour period. Do not take ibuprofen/Motrin if you have a history of bleeding stomach ulcers, severe kidney disease, &/or actively taking a blood thinner  What if I still have pain? If you have pain that is not   controlled with the over-the-counter pain medications (Tylenol and Motrin or Advil) you might have what we call "breakthrough" pain. You will receive a prescription for a small amount of an opioid pain medication such as Oxycodone, Tramadol, or Tylenol with Codeine. Use these opioid pills in the first 24 hours after surgery if you have breakthrough pain. Do not take more than 1 pill every 4-6 hours.  If you still have uncontrolled pain after using all opioid pills, don't hesitate to call our staff using the number provided. We will help make sure you are managing your pain in the best way possible, and if necessary, we can provide a prescription for additional pain medication.   Day 1    Time  Name of Medication Number of pills taken  Amount of Acetaminophen  Pain Level   Comments  AM PM       AM PM       AM PM       AM PM       AM PM       AM PM       AM PM       AM PM       Total Daily amount of Acetaminophen Do not  take more than  3,000 mg per day      Day 2    Time  Name of Medication Number of pills taken  Amount of Acetaminophen  Pain Level   Comments  AM PM       AM PM       AM PM       AM PM       AM PM       AM PM       AM PM       AM PM       Total Daily amount of Acetaminophen Do not take more than  3,000 mg per day      Day 3    Time  Name of Medication Number of pills taken  Amount of Acetaminophen  Pain Level   Comments  AM PM       AM PM       AM PM       AM PM          AM PM       AM PM       AM PM       AM PM       Total Daily amount of Acetaminophen Do not take more than  3,000 mg per day      Day 4    Time  Name of Medication Number of pills taken  Amount of Acetaminophen  Pain Level   Comments  AM PM       AM PM       AM PM       AM PM       AM PM       AM PM       AM PM       AM PM       Total Daily amount of Acetaminophen Do not take more than  3,000 mg per day      Day 5    Time  Name of Medication Number of pills taken  Amount of Acetaminophen  Pain Level   Comments  AM PM       AM PM       AM   PM       AM PM       AM PM       AM PM       AM PM       AM PM       Total Daily amount of Acetaminophen Do not take more than  3,000 mg per day       Day 6    Time  Name of Medication Number of pills taken  Amount of Acetaminophen  Pain Level  Comments  AM PM       AM PM       AM PM       AM PM       AM PM       AM PM       AM PM       AM PM       Total Daily amount of Acetaminophen Do not take more than  3,000 mg per day      Day 7    Time  Name of Medication Number of pills taken  Amount of Acetaminophen  Pain Level   Comments  AM PM       AM PM       AM PM       AM PM       AM PM       AM PM       AM PM       AM PM       Total Daily amount of Acetaminophen Do not take more than  3,000 mg per day        For additional information about how and where to safely dispose of unused  opioid medications - https://www.morepowerfulnc.org  Disclaimer: This document contains information and/or instructional materials adapted from [facility] for the typical patient with your condition. It does not replace medical advice from your health care provider because your experience may differ from that of the typical patient. Talk to your health care provider if you have any questions about this document, your condition or your treatment plan. Adapted from [facility]   

## 2018-08-03 NOTE — Plan of Care (Signed)
  Problem: Activity: Goal: Risk for activity intolerance will decrease Outcome: Progressing   

## 2018-08-03 NOTE — Progress Notes (Signed)
[name], male   DOB: 07-08-1960, 58 y.o.   MRN: [id number]  PROGRESS NOTE    Matthew Kline  JHE:[id number] DOB: [birth year] DOA: 07/31/2018 PCP: [facility]   Brief Narrative:  58 year old male with history of anemia, anxiety, end-stage renal disease on dialysis, GERD, diabetes mellitus type 2, left AKA, chronic abdominal pain, inguinal hernia, CAD status post CABG, polymyalgia rheumatica presented on 07/31/2018 with abdominal pain.  He was found to have acute cholecystitis.  General surgery was consulted.  He was started on IV antibiotics.  Assessment & Plan: Acute calculus cholecystitis s/p Lap chole on 08/02/18 General surgery on board Post op care as gen surg Monitor closely  End-stage renal disease on hemodialysis Nephrology on board, Dialysis as per nephrology schedule  CAD with history of CABG Cardiology consulted for cardiology clearance Echo pending Aspirin on hold.  Continue Coreg.  Diabetes mellitus type 2, diet controlled Hemoglobin A1c 5.7.  Continue CBGs with SSI.  Hypertension Monitor blood pressure.  Continue Coreg and amlodipine.  Anemia of chronic kidney disease  Hemoglobin stable.  Monitor  Thrombocytopenia Questionable cause.  Stable.  No signs of bleeding  Inguinal hernias Outpatient elective repair as per general surgery.     DVT prophylaxis: As per general surgery Code Status: Full Family Communication: Discussed with patient Disposition Plan: As per surgery  Consultants: General surgery/cardiology/nephrology Procedures: Lap chole on 08/02/2018  Antimicrobials: Zosyn from 07/31/2018, discontinued   Subjective: Patient reports post op pain controlled with meds. Reports hx of folliculitis with generalized itching. Pt takes hydroxyzine prn at home. Denies any new complaints   Objective: Vitals:   08/03/18 1230 08/03/18 1300 08/03/18 1355 08/03/18 1605  BP: 129/81 133/78 136/80 107/77  Pulse: 75 73 98 69   Resp:  14 18 18   Temp:  98 F (36.7 C) 98.4 F (36.9 C) 97.8 F (36.6 C)  TempSrc:  Oral Oral Oral  SpO2:   98% 92%  Weight:  69.3 kg    Height:        Intake/Output Summary (Last 24 hours) at 08/03/2018 1709 Last data filed at 08/03/2018 1300 Gross per 24 hour  Intake 0 ml  Output 2017 ml  Net -2017 ml   Filed Weights   08/02/18 2020 08/03/18 0855 08/03/18 1300  Weight: 74.5 kg 70.4 kg 69.3 kg    Examination:  General: NAD   Cardiovascular: S1, S2 present  Respiratory: CTAB  Abdomen: Soft, tender around lap site, nondistended, bowel sounds present  Musculoskeletal: No bilateral pedal edema noted, left AKA  Skin: Normal  Psychiatry: Normal mood    Data Reviewed: I have personally reviewed following labs and imaging studies  CBC: Recent Labs  Lab 07/31/18 1221 08/01/18 0019 08/02/18 0602 08/03/18 0547  WBC 4.7 4.1 3.1* 6.0  NEUTROABS 3.2  --  1.8  --   HGB 10.5* 10.1* 10.0* 10.5*  HCT 33.3* 32.8* 31.5* 33.0*  MCV 97.7 97.3 95.5 95.4  PLT 124* 125* 88* 91*   Basic Metabolic Panel: Recent Labs  Lab 07/31/18 1221 08/01/18 0019 08/02/18 0602 08/03/18 0547  NA 140 140 134* 134*  K 4.6 4.8 3.9 4.3  CL 101 101 96* 95*  CO2 26 28 26 24   GLUCOSE 132* 90 81 103*  BUN 25* 28* 16 23*  CREATININE 7.41* 7.73* 5.48* 6.50*  CALCIUM 9.5 9.6 8.9 9.3  MG  --  2.2 2.1  --   PHOS  --  4.8*  --   --  GFR: Estimated Creatinine Clearance: 12 mL/min (A) (by C-G formula based on SCr of 6.5 mg/dL (H)). Liver Function Tests: Recent Labs  Lab 07/31/18 1221 08/01/18 0019 08/02/18 0602  AST 16 15 12*  ALT 11 11 9   ALKPHOS 88 78 69  BILITOT 1.1 0.9 0.7  PROT 7.1 6.9 6.3*  ALBUMIN 3.7 3.5 3.1*   Recent Labs  Lab 07/31/18 1221  LIPASE 26   No results for input(s): AMMONIA in the last 168 hours. Coagulation Profile: No results for input(s): INR, PROTIME in the last 168 hours. Cardiac Enzymes: No results for input(s): CKTOTAL, CKMB, CKMBINDEX, TROPONINI  in the last 168 hours. BNP (last 3 results) No results for input(s): PROBNP in the last 8760 hours. HbA1C: Recent Labs    08/01/18 0019  HGBA1C 5.7*   CBG: Recent Labs  Lab 08/03/18 0405 08/03/18 0735 08/03/18 1352 08/03/18 1518 08/03/18 1604  GLUCAP 101* 87 89 107* 93   Lipid Profile: No results for input(s): CHOL, HDL, LDLCALC, TRIG, CHOLHDL, LDLDIRECT in the last 72 hours. Thyroid Function Tests: Recent Labs    08/01/18 0019  TSH 3.359   Anemia Panel: No results for input(s): VITAMINB12, FOLATE, FERRITIN, TIBC, IRON, RETICCTPCT in the last 72 hours. Sepsis Labs: Recent Labs  Lab 07/31/18 1245 08/01/18 0019  LATICACIDVEN 1.1 1.2    Recent Results (from the past 240 hour(s))  C difficile quick scan w PCR reflex     Status: None   Collection Time: 07/26/18  3:53 PM   Specimen: STOOL  Result Value Ref Range Status   C Diff antigen NEGATIVE NEGATIVE Final   C Diff toxin NEGATIVE NEGATIVE Final   C Diff interpretation No C. difficile detected.  Final    Comment: Performed at [facility], [place] [address], [place], [place] [address]  Gastrointestinal Panel by PCR , Stool     Status: None   Collection Time: 07/26/18  3:53 PM   Specimen: STOOL  Result Value Ref Range Status   Campylobacter species NOT DETECTED NOT DETECTED Final   Plesimonas shigelloides NOT DETECTED NOT DETECTED Final   Salmonella species NOT DETECTED NOT DETECTED Final   Yersinia enterocolitica NOT DETECTED NOT DETECTED Final   Vibrio species NOT DETECTED NOT DETECTED Final   Vibrio cholerae NOT DETECTED NOT DETECTED Final   Enteroaggregative E coli (EAEC) NOT DETECTED NOT DETECTED Final   Enteropathogenic E coli (EPEC) NOT DETECTED NOT DETECTED Final   Enterotoxigenic E coli (ETEC) NOT DETECTED NOT DETECTED Final   Shiga like toxin producing E coli (STEC) NOT DETECTED NOT DETECTED Final   Shigella/Enteroinvasive E coli (EIEC) NOT DETECTED NOT DETECTED Final   Cryptosporidium NOT DETECTED  NOT DETECTED Final   Cyclospora cayetanensis NOT DETECTED NOT DETECTED Final   Entamoeba histolytica NOT DETECTED NOT DETECTED Final   Giardia lamblia NOT DETECTED NOT DETECTED Final   Adenovirus F40/41 NOT DETECTED NOT DETECTED Final   Astrovirus NOT DETECTED NOT DETECTED Final   Norovirus GI/GII NOT DETECTED NOT DETECTED Final   Rotavirus A NOT DETECTED NOT DETECTED Final   Sapovirus (I, II, IV, and V) NOT DETECTED NOT DETECTED Final    Comment: Performed at [facility], [address] [place], [place] [address]  SARS Coronavirus 2 (CEPHEID - Performed in [place] hospital lab), Hosp Order     Status: None   Collection Time: 07/31/18  8:41 PM   Specimen: Nasopharyngeal Swab  Result Value Ref Range Status   SARS Coronavirus 2 NEGATIVE  NEGATIVE Final    Comment: (NOTE) If result is NEGATIVE SARS-CoV-2 target nucleic acids are NOT DETECTED. The SARS-CoV-2 RNA is generally detectable in upper and lower  respiratory specimens during the acute phase of infection. The lowest  concentration of SARS-CoV-2 viral copies this assay can detect is 250  copies / mL. A negative result does not preclude SARS-CoV-2 infection  and should not be used as the sole basis for treatment or other  patient management decisions.  A negative result may occur with  improper specimen collection / handling, submission of specimen other  than nasopharyngeal swab, presence of viral mutation(s) within the  areas targeted by this assay, and inadequate number of viral copies  (<250 copies / mL). A negative result must be combined with clinical  observations, patient history, and epidemiological information. If result is POSITIVE SARS-CoV-2 target nucleic acids are DETECTED. The SARS-CoV-2 RNA is generally detectable in upper and lower  respiratory specimens dur ing the acute phase of infection.  Positive  results are indicative of active infection with SARS-CoV-2.  Clinical  correlation with patient  history and other diagnostic information is  necessary to determine patient infection status.  Positive results do  not rule out bacterial infection or co-infection with other viruses. If result is PRESUMPTIVE POSTIVE SARS-CoV-2 nucleic acids MAY BE PRESENT.   A presumptive positive result was obtained on the submitted specimen  and confirmed on repeat testing.  While 2019 novel coronavirus  (SARS-CoV-2) nucleic acids may be present in the submitted sample  additional confirmatory testing may be necessary for epidemiological  and / or clinical management purposes  to differentiate between  SARS-CoV-2 and other Sarbecovirus currently known to infect humans.  If clinically indicated additional testing with an alternate test  methodology [phone]) is advised. The SARS-CoV-2 RNA is generally  detectable in upper and lower respiratory sp ecimens during the acute  phase of infection. The expected result is Negative. Fact Sheet for Patients:  StrictlyIdeas.no Fact Sheet for Healthcare Providers: BankingDealers.co.za This test is not yet approved or cleared by the [place] FDA and has been authorized for detection and/or diagnosis of SARS-CoV-2 by FDA under an Emergency Use Authorization (EUA).  This EUA will remain in effect (meaning this test can be used) for the duration of the COVID-19 declaration under Section 564(b)(1) of the Act, 21 U.S.C. section 360bbb-3(b)(1), unless the authorization is terminated or revoked sooner. Performed at [facility], [place] [address], [place], [place] [address]   Surgical pcr screen     Status: None   Collection Time: 07/31/18 11:09 PM   Specimen: Nasal Mucosa; Nasal Swab  Result Value Ref Range Status   MRSA, PCR NEGATIVE NEGATIVE Final   Staphylococcus aureus NEGATIVE NEGATIVE Final    Comment: (NOTE) The Xpert SA Assay (FDA approved for NASAL specimens in patients 83 years of age and older),  is one component of a comprehensive surveillance program. It is not intended to diagnose infection nor to guide or monitor treatment. Performed at [facility], [place] [address] [place], [place] [address]   Culture, blood (routine x 2)     Status: None (Preliminary result)   Collection Time: 08/01/18  8:23 AM   Specimen: BLOOD RIGHT ARM  Result Value Ref Range Status   Specimen Description BLOOD RIGHT ARM  Final   Special Requests   Final    BOTTLES DRAWN AEROBIC AND ANAEROBIC Blood Culture adequate volume   Culture   Final  NO GROWTH 2 DAYS Performed at [facility], [place] [address] [place], [place] [address]    Report Status PENDING  Incomplete  Culture, blood (routine x 2)     Status: None (Preliminary result)   Collection Time: 08/01/18  8:31 AM   Specimen: BLOOD RIGHT HAND  Result Value Ref Range Status   Specimen Description BLOOD RIGHT HAND  Final   Special Requests   Final    BOTTLES DRAWN AEROBIC ONLY Blood Culture results may not be optimal due to an inadequate volume of blood received in culture bottles   Culture   Final    NO GROWTH 2 DAYS Performed at [facility], [place] [address], [place], [place] [address]    Report Status PENDING  Incomplete         Radiology Studies: Dg Cholangiogram Operative  Result Date: 08/02/2018 CLINICAL DATA:  Intraoperative cholangiogram during laparoscopic cholecystectomy. EXAM: INTRAOPERATIVE CHOLANGIOGRAM FLUOROSCOPY TIME:  10 seconds COMPARISON:  Right upper quadrant abdominal ultrasound-07/31/2018 FINDINGS: A single spot intraoperative cholangiographic image of the right upper abdominal quadrant during laparoscopic cholecystectomy are provided for review. Surgical clips overlie the expected location of the gallbladder fossa. Contrast injection demonstrates selective cannulation of the central aspect of the cystic duct. There is passage of contrast through the central aspect of the cystic duct with filling of a non  dilated common bile duct. There is passage of contrast though the CBD and into the descending portion of the duodenum. There is minimal reflux of injected contrast into the common hepatic duct and central aspect of the non dilated intrahepatic biliary system. There are no discrete filling defects within the opacified portions of the biliary system to suggest the presence of choledocholithiasis. IMPRESSION: No evidence of choledocholithiasis. Electronically Signed   By: Sandi Mariscal M.D.   On: 08/02/2018 12:51        Scheduled Meds: . acetaminophen  1,000 mg Oral Q6H  . amLODipine  5 mg Oral Daily  . carvedilol  12.5 mg Oral BID WC  . Chlorhexidine Gluconate Cloth  6 each Topical Q0600  . darbepoetin (ARANESP) injection - DIALYSIS  60 mcg Intravenous Q Thu-HD  . insulin aspart  0-9 Units Subcutaneous Q4H  . sodium chloride flush  3 mL Intravenous Q12H   Continuous Infusions: . sodium chloride       LOS: 3 days        Alma Friendly, MD [facility] 08/03/2018, 5:09 PM

## 2018-08-03 NOTE — Progress Notes (Signed)
OT Cancellation Note  Patient Details Name: Matthew Kline MRN: [id number] DOB: Apr 18, 1960   Cancelled Treatment:    Reason Eval/Treat Not Completed: Patient at procedure or test/ unavailable(HD).  Darrol Jump OTR/L 08/03/2018, 9:34 AM

## 2018-08-03 NOTE — ED Provider Notes (Signed)
[facility] EMERGENCY DEPARTMENT Provider Note   CSN: [id number] Arrival date & time: 07/26/18  1118     History   Chief Complaint No chief complaint on file.   HPI Matthew Kline is a 58 y.o. male.     HPI   17 Male with abdominal pain and diarrhea.  Onset Sunday.  Persistent since then.  Nausea and vomiting.  No blood in stool or emesis.  He is end-stage renal disease and has dialysis Monday, Wednesday and Friday.  He missed dialysis Monday because of his symptoms.  No fevers or chills.  Known hernias but he states that these seem to be stable.  Does not make significant urine.  Past Medical History:  Diagnosis Date  . Anemia, unspecified   . Anxiety   . Arthralgia 2010   polyarticular  . Arthritis    "back, knees" (01/10/2017)  . Cancer [facility])    "kidney area" (01/10/2017)  . CHF (congestive heart failure) ([place]) 07/25/2009   denies  . Chronic lower back pain   . Coronary artery disease   . Coughing    pt. reports that he has drainage from sinus infection  . Diabetic foot ulcer ([place])   . Diabetic neuropathy ([place])   . ESRD (end stage renal disease) on dialysis [facility])    started 12/2012; "MWF; [place] "  (01/10/2017)  . GERD (gastroesophageal reflux disease)    hx "before I lost weight", no problem 9 years  . Hemodialysis access site with mature fistula ([place])   . Hemorrhoids, internal 10/2011   small  . High cholesterol   . History of blood transfusion    "related to the anemia"  . Hypertension   . Insomnia, unspecified   . Lacunar infarction ([place]) 2006   RUE/RLE, speech  . Long term (current) use of anticoagulants   . Myocardial infarction ([place]) 1995  . Orthostatic hypotension   . Osteomyelitis of foot, left, acute ([place])   . Other chronic postoperative pain   . Pneumonia    "probably twice" (01/10/2017)  . Polymyalgia rheumatica ([place])   . Renal insufficiency   . Sleep apnea    "lost weight; no more problem" (01/10/2017)  .  Stroke ([place]) 01/10/06   denies residual on 05/09/2014  . Type II diabetes mellitus ([place]) dx'd 1995  . Unspecified hereditary and idiopathic peripheral neuropathy    feet  . Unspecified osteomyelitis, site unspecified   . Unspecified vitamin D deficiency     Patient Active Problem List   Diagnosis Date Noted  . Biliary colic 48/18/5631  . Acute cholecystitis 07/31/2018  . Prolonged QT interval 07/31/2018  . Acute respiratory failure with hypoxia ([place]) 07/10/2018  . Abdominal pain 07/10/2018  . Bilateral recurrent inguinal hernia without obstruction or gangrene   . Altered mental status   . Cerebral thrombosis with cerebral infarction 02/06/2017  . Cerebral embolism with cerebral infarction 02/06/2017  . Hx of AKA (above knee amputation), left ([place])   . Pressure injury of skin 01/30/2017  . Acute lower UTI 01/29/2017  . Acute metabolic encephalopathy 49/70/2637  . UTI (urinary tract infection) 01/29/2017  . Quadriceps muscle rupture, left, initial encounter   . Fall 01/09/2017  . Gait disturbance 01/09/2017  . Staphylococcus aureus infection 01/09/2017  . Infection of prosthetic left knee joint ([place]) 01/09/2017  . Fever, unknown origin 11/09/2016  . Critical lower limb ischemia 08/25/2016  . Ulcer of left midfoot with fat layer exposed ([place]) 08/13/2016  . Diabetic ulcer  of left midfoot associated with type 2 diabetes mellitus, with fat layer exposed ([place]) 07/25/2016  . Peripheral neuropathy 07/22/2016  . Tobacco abuse 07/22/2016  . CAD in native artery 05/18/2016  . CAD, multiple vessel 05/11/2016  . Positive cardiac stress test 05/11/2016  . Abnormal stress test 04/30/2016  . Pre-transplant evaluation for kidney transplant 04/30/2016  . S/P revision of total knee 11/26/2015  . Pain in the chest   . Acute on chronic diastolic heart failure ([place]) 07/05/2015  . Volume overload 07/04/2015  . Shortness of breath 07/04/2015  . Hypoxemia 07/04/2015  . Elevated troponin   .  End-stage renal disease on hemodialysis ([place])   . Hypervolemia   . 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 Pyogenic bacterial arthritis of knee, left ([place]) 08/07/2014  . Tachycardia 07/24/2014  . Acute upper respiratory infection 07/24/2014  . ESRD on dialysis ([place]) 07/14/2014  . Type II diabetes mellitus ([place]) 07/14/2014  . Anemia in chronic kidney disease 07/14/2014  . Congestive heart failure (CHF) ([place]) 07/13/2014  . Surgical wound dehiscence 05/09/2014  . Dehiscence of closure of skin 05/09/2014  . Total knee replacement status 04/10/2014  . Diabetes mellitus with renal manifestations, controlled ([place]) 10/24/2013  . Hypertensive renal disease 06/27/2013  . DM type 2 causing vascular disease ([place]) 06/27/2013  . Erectile dysfunction 06/27/2013  . Depression 06/27/2013  . Claudication of left lower extremity ([place]) 12/19/2012  . Essential hypertension, benign 12/19/2012  . Sinusitis, acute maxillary 11/22/2012  . Otitis, externa, infective 11/14/2012  . Leg edema, left 11/14/2012  . End stage renal disease ([place]) 10/02/2012  . Controlled type 2 DM with proteinuria or microalbuminuria 09/19/2012  . GERD (gastroesophageal reflux disease) 09/19/2012  . Leukocytosis 09/19/2012  . Lacunar infarction ([place]) 08/17/2012  . Polymyalgia rheumatica ([place]) 08/17/2012  . 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 Essential hypertension 05/10/2012  . Vitamin D deficiency 05/10/2012  . Diabetes mellitus due to underlying condition ([place]) 05/10/2012  . Hyperlipidemia LDL goal <100 05/10/2012  . Anemia of chronic disease 05/10/2012  . Screening for prostate cancer 05/10/2012  . Chronic kidney disease (CKD), stage IV (severe) ([place]) 05/10/2012  . Peripheral autonomic neuropathy due to DM ([place]) 05/10/2012  . Callus of foot 05/10/2012  . Urgency of urination 05/10/2012  . Hyperkalemia 05/10/2012  . Candidiasis of the esophagus 10/12/2011  . Internal hemorrhoids without mention of  complication 65/46/5035  . Pre-syncope 07/25/2009  . DJD (degenerative joint disease) of cervical spine 02/17/2009    Past Surgical History:  Procedure Laterality Date  . ABDOMINAL AORTOGRAM N/A 08/25/2016   Procedure: ABDOMINAL AORTOGRAM;  Surgeon: Wellington Hampshire, MD;  Location: [place] [facility] CV [facility];  Service: Cardiovascular;  Laterality: N/A;  . AMPUTATION  01/21/2012   Procedure: AMPUTATION RAY;  Surgeon: [name], MD;  Location: [facility];  Service: Orthopedics;  Laterality: Left;  Left Foot 4th Ray Amputation  . AMPUTATION Left 05/04/2013   Procedure: AMPUTATION DIGIT;  Surgeon: [name], MD;  Location: [place];  Service: Orthopedics;  Laterality: Left;  Left Great Toe Amputation at [facility]  . AMPUTATION Left 01/14/2017   Procedure: AMPUTATION ABOVE LEFT KNEE;  Surgeon: [name], MD;  Location: [place];  Service: Orthopedics;  Laterality: Left;  . ANTERIOR CERVICAL DECOMP/DISCECTOMY FUSION  02/2011  . BACK SURGERY    . BASCILIC VEIN TRANSPOSITION Left 10/19/2012   Procedure: BASCILIC VEIN TRANSPOSITION;  Surgeon: Serafina Mitchell, MD;  Location: [facility];  Service: Vascular;  Laterality: Left;  .  CARDIAC CATHETERIZATION     "before bypass"  . CHOLECYSTECTOMY N/A 08/02/2018   Procedure: LAPAROSCOPIC CHOLECYSTECTOMY WITH INTRAOPERATIVE CHOLANGIOGRAM;  Surgeon: Donnie Mesa, MD;  Location: [place];  Service: General;  Laterality: N/A;  . CORONARY ARTERY BYPASS GRAFT     x 5 with lima at [place] SPACERS Left 08/07/2014   Procedure: Replace Left Total Knee Arthroplasty,  Place Antibiotic Spacer;  Surgeon: [name], MD;  Location: [place];  Service: Orthopedics;  Laterality: Left;  . I&D EXTREMITY Left 05/09/2014   Procedure: Irrigation and Debridement Left Knee and Closure of Total Knee Arthroplasty Incision;  Surgeon: [name], MD;  Location: [facility];  Service: Orthopedics;  Laterality: Left;  . I&D KNEE WITH POLY EXCHANGE Left  05/31/2014   Procedure: IRRIGATION AND DEBRIDEMENT LEFT KNEE, PLACE ANTIBIOTIC BEADS,  POLY EXCHANGE;  Surgeon: [name], MD;  Location: [place];  Service: Orthopedics;  Laterality: Left;  . IRRIGATION AND DEBRIDEMENT KNEE Left 01/12/2017   Procedure: IRRIGATION AND DEBRIDEMENT LEFT KNEE;  Surgeon: [name], MD;  Location: [facility];  Service: Orthopedics;  Laterality: Left;  . JOINT REPLACEMENT    . KNEE ARTHROSCOPY Left 08-25-2012  . LOWER EXTREMITY ANGIOGRAPHY Left 08/25/2016   Procedure: Lower Extremity Angiography;  Surgeon: Wellington Hampshire, MD;  Location: [facility] CV LAB;  Service: Cardiovascular;  Laterality: Left;  . PERIPHERAL VASCULAR BALLOON ANGIOPLASTY Left 08/25/2016   Procedure: PERIPHERAL VASCULAR BALLOON ANGIOPLASTY;  Surgeon: Wellington Hampshire, MD;  Location: [place] CV LAB;  Service: Cardiovascular;  Laterality: Left;  lt peroneal and ant tibial arteries cutting balloon  . REFRACTIVE SURGERY Bilateral   . TOE AMPUTATION Bilateral    "I've lost 7 toes over the last 7 years" (05/09/2014)  . TOE SURGERY Left April 2015   Big toe removed on left foot.  . TONSILLECTOMY    . TOTAL KNEE ARTHROPLASTY Left 04/10/2014   Procedure: TOTAL KNEE ARTHROPLASTY;  Surgeon: [name], MD;  Location: [place];  Service: Orthopedics;  Laterality: Left;  . TOTAL KNEE REVISION Left 10/25/2014   Procedure: LEFT TOTAL KNEE REVISION;  Surgeon: [name], MD;  Location: [place];  Service: Orthopedics;  Laterality: Left;  . TOTAL KNEE REVISION Left 11/26/2015   Procedure: Removal Left Total Knee Arthroplasty, Hinged Total Knee Arthroplasty;  Surgeon: [name], MD;  Location: [place];  Service: Orthopedics;  Laterality: Left;  . UVULOPALATOPHARYNGOPLASTY, TONSILLECTOMY AND SEPTOPLASTY  ~ 1989  . WOUND DEBRIDEMENT Left 05/09/2014   Dehiscence Left Total Knee Arthroplasty Incision        Home Medications    Prior to Admission medications   Medication Sig Start Date End Date Taking?  Authorizing Provider  amLODipine (NORVASC) 5 MG tablet Take 5 mg by mouth daily.    Yes [provider]  aspirin EC 81 MG EC tablet Take 2 tablets (162 mg total) by mouth daily. Patient taking differently: Take 81 mg by mouth daily.  01/20/17  Yes Patrecia Pour, Christean Grief, MD  carvedilol (COREG) 12.5 MG tablet Take 1 tablet (12.5 mg total) by mouth 2 (two) times daily with a meal. 02/08/17  Yes Bonnell Public, MD  Darbepoetin Alfa (ARANESP) 150 MCG/0.3ML SOSY injection Inject 0.3 mLs (150 mcg total) into the vein every Friday with hemodialysis. 02/11/17  Yes Dana Allan I, MD  hydrOXYzine (ATARAX/VISTARIL) 50 MG tablet Take 50 mg by mouth 3 (three) times daily as needed  for itching.    Yes [provider]  multivitamin (RENA-VIT) TABS tablet Take 1 tablet by mouth at bedtime. 02/08/17  Yes Dana Allan I, MD  sevelamer carbonate (RENVELA) 800 MG tablet Take 32,000 mg by mouth 3 (three) times daily with meals.    Yes [provider]  temazepam (RESTORIL) 30 MG capsule Take 30 mg by mouth at bedtime as needed for sleep.   Yes [provider]  Amino Acids-Protein Hydrolys (FEEDING SUPPLEMENT, PRO-STAT SUGAR FREE 64,) LIQD Take 30 mLs by mouth 2 (two) times daily. Patient not taking: Reported on 11/30/2017 01/19/17   Patrecia Pour, Christean Grief, MD  dicyclomine (BENTYL) 20 MG tablet Take 1 tablet (20 mg total) by mouth every 6 (six) hours as needed for spasms (abdominal pain). 07/26/18   Virgel Manifold, MD  ferric citrate (AURYXIA) 1 GM 210 MG(Fe) tablet Take 2 tablets (420 mg total) by mouth 3 (three) times daily with meals. Patient not taking: Reported on 11/30/2017 02/08/17   Dana Allan I, MD  gabapentin (NEURONTIN) 300 MG capsule TAKE 1 CAPSULE BY MOUTH THREE TIMES A DAY Patient not taking: No sig reported 10/24/17   [name], MD  montelukast (SINGULAIR) 10 MG tablet TAKE 1 TABLET (10 MG TOTAL) BY MOUTH AT BEDTIME. Patient not taking: No sig reported  09/01/15   Gildardo Cranker, DO  Nutritional Supplements (FEEDING SUPPLEMENT, NEPRO CARB STEADY,) LIQD Take 237 mLs by mouth 2 (two) times daily between meals. Patient not taking: Reported on 11/30/2017 01/19/17   Patrecia Pour, Christean Grief, MD  ondansetron (ZOFRAN ODT) 4 MG disintegrating tablet Take 1 tablet (4 mg total) by mouth every 8 (eight) hours as needed for nausea or vomiting. Patient not taking: Reported on 07/10/2018 12/16/17   Kinnie Feil, PA-C  pentoxifylline (TRENTAL) 400 MG CR tablet TAKE 1 TABLET (400 MG TOTAL) BY MOUTH 3 (THREE) TIMES DAILY WITH MEALS. Patient not taking: Reported on 07/10/2018 02/22/18   [name], MD  traMADol (ULTRAM) 50 MG tablet Take 1 tablet (50 mg total) by mouth every 6 (six) hours as needed. Patient not taking: Reported on 07/10/2018 12/16/17   Kinnie Feil, PA-C    Family History Family History  Problem Relation Age of Onset  . Hypertension Mother   . Cancer Mother 40       Ovarian  . Heart disease Maternal Aunt   . Stroke Maternal Grandfather     Social History Social History   Tobacco Use  . Smoking status: Former Smoker    Packs/day: 0.12    Years: 32.00    Pack years: 3.84    Types: Cigarettes    Quit date: 10/05/2016    Years since quitting: 1.8  . Smokeless tobacco: Never Used  Substance Use Topics  . Alcohol use: No    Alcohol/week: 0.0 standard drinks  . Drug use: No     Allergies   Morphine and related, Tygacil [tigecycline], and Imodium [loperamide]   Review of Systems Review of Systems  All systems reviewed and negative, other than as noted in HPI. Physical Exam Updated Vital Signs BP (!) 137/100 (BP Location: Right Arm)   Pulse 81   Temp 98.2 F (36.8 C) (Oral)   Resp 16   Ht 5\' 11"  (1.803 m)   Wt 75.3 kg   SpO2 98%   BMI 23.15 kg/m   Physical Exam Vitals signs and nursing note reviewed.  Constitutional:      General: He is not in acute distress.  Appearance: He is well-developed.  HENT:      Head: Normocephalic and atraumatic.  Eyes:     General:        Right eye: No discharge.        Left eye: No discharge.     Conjunctiva/sclera: Conjunctivae normal.  Neck:     Musculoskeletal: Neck supple.  Cardiovascular:     Rate and Rhythm: Normal rate and regular rhythm.     Heart sounds: Normal heart sounds. No murmur. No friction rub. No gallop.   Pulmonary:     Effort: Pulmonary effort is normal. No respiratory distress.     Breath sounds: Normal breath sounds.  Abdominal:     General: There is no distension.     Palpations: Abdomen is soft.     Tenderness: There is abdominal tenderness.     Hernia: A hernia is present.     Comments: Large right inguinal hernia extending into scrotum.  Reducible.  Mild periumbilical tenderness without rebound or guarding.  No distention.  Musculoskeletal:        General: No tenderness.  Skin:    General: Skin is warm and dry.  Neurological:     Mental Status: He is alert.  Psychiatric:        Behavior: Behavior normal.        Thought Content: Thought content normal.      ED Treatments / Results  Labs (all labs ordered are listed, but only abnormal results are displayed) Labs Reviewed  COMPREHENSIVE METABOLIC PANEL - Abnormal; Notable for the following components:      Result Value   Potassium 5.4 (*)    Glucose, Bld 114 (*)    BUN 43 (*)    Creatinine, Ser 8.51 (*)    Albumin 3.4 (*)    Total Bilirubin 1.3 (*)    GFR calc non Af Amer 6 (*)    GFR calc Af Amer 7 (*)    All other components within normal limits  CBC - Abnormal; Notable for the following components:   WBC 3.9 (*)    RBC 3.53 (*)    Hemoglobin 10.9 (*)    HCT 34.7 (*)    Platelets 112 (*)    All other components within normal limits  C DIFFICILE QUICK SCREEN W PCR REFLEX  GASTROINTESTINAL PANEL BY PCR, STOOL (REPLACES STOOL CULTURE)  LIPASE, BLOOD    EKG EKG Interpretation  Date/Time:  Wednesday July 26 2018 11:41:31 EDT Ventricular Rate:  77 PR  Interval:    QRS Duration: 133 QT Interval:  466 QTC Calculation: 528 R Axis:   85 Text Interpretation:  Sinus rhythm Nonspecific intraventricular conduction delay Artifact in lead(s) I III aVR aVL aVF V1 V2 V5 V6 Confirmed by Virgel Manifold [phone]) on 07/26/2018 11:59:34 AM   Radiology Dg Cholangiogram Operative  Result Date: 08/02/2018 CLINICAL DATA:  Intraoperative cholangiogram during laparoscopic cholecystectomy. EXAM: INTRAOPERATIVE CHOLANGIOGRAM FLUOROSCOPY TIME:  10 seconds COMPARISON:  Right upper quadrant abdominal ultrasound-07/31/2018 FINDINGS: A single spot intraoperative cholangiographic image of the right upper abdominal quadrant during laparoscopic cholecystectomy are provided for review. Surgical clips overlie the expected location of the gallbladder fossa. Contrast injection demonstrates selective cannulation of the central aspect of the cystic duct. There is passage of contrast through the central aspect of the cystic duct with filling of a non dilated common bile duct. There is passage of contrast though the CBD and into the descending portion of the duodenum. There is minimal reflux of injected contrast  into the common hepatic duct and central aspect of the non dilated intrahepatic biliary system. There are no discrete filling defects within the opacified portions of the biliary system to suggest the presence of choledocholithiasis. IMPRESSION: No evidence of choledocholithiasis. Electronically Signed   By: Sandi Mariscal M.D.   On: 08/02/2018 12:51    Procedures Procedures (including critical care time)  Medications Ordered in ED Medications  loperamide (IMODIUM) capsule 4 mg (4 mg Oral Given 07/26/18 1313)  HYDROmorphone (DILAUDID) injection 0.5 mg (0.5 mg Intravenous Given 07/26/18 1313)  diphenhydrAMINE (BENADRYL) injection 25 mg (25 mg Intravenous Given 07/26/18 1441)     Initial Impression / Assessment and Plan / ED Course  I have reviewed the triage vital signs and the  nursing notes.  Pertinent labs & imaging results that were available during my care of the patient were reviewed by me and considered in my medical decision making (see chart for details).        58 year old male with abdominal pain and nausea/vomiting/diarrhea.  Large inguinal hernia but this is reducible.  I do not feel that he is obstructed.  He is nondistended on exam.  No vomiting here in the emergency room.  Stool C. difficile negative.  Plan continue symptomatic treatment.  Return precautions were discussed.  End-stage renal disease but no emergent indication for dialysis at this time.  Needs to follow-up at his outpatient dialysis center as soon as he can.  Return precautions discussed. Final Clinical Impressions(s) / ED Diagnoses   Final diagnoses:  Diarrhea, unspecified type  Abdominal pain, unspecified abdominal location    ED Discharge Orders         Ordered    dicyclomine (BENTYL) 20 MG tablet  Every 6 hours PRN     07/26/18 1718           Virgel Manifold, MD 08/03/18 [phone]

## 2018-08-03 NOTE — Progress Notes (Signed)
Progress Note  Patient Name: Matthew Kline Date of Encounter: 08/03/2018  Primary Cardiologist: [facility] Seen by M Arida 2018 for PAD    Subjective   Breathing is OK   No CP   Itches all over   Inpatient Medications    Scheduled Meds:  acetaminophen  1,000 mg Oral Q6H   amLODipine  5 mg Oral Daily   carvedilol  12.5 mg Oral BID WC   Chlorhexidine Gluconate Cloth  6 each Topical Q0600   darbepoetin (ARANESP) injection - DIALYSIS  60 mcg Intravenous Q Thu-HD   insulin aspart  0-9 Units Subcutaneous Q4H   sodium chloride flush  3 mL Intravenous Q12H   Continuous Infusions:  sodium chloride     PRN Meds: sodium chloride, HYDROmorphone (DILAUDID) injection, loratadine, oxyCODONE, sodium chloride flush, temazepam   Vital Signs    Vitals:   08/02/18 1134 08/02/18 1755 08/02/18 2020 08/03/18 0405  BP: 134/86 139/80 129/82 (!) 136/91  Pulse: 60 69 73 74  Resp: 11 15 18 18   Temp:  97.6 F (36.4 C) 98.5 F (36.9 C) 98.1 F (36.7 C)  TempSrc:  Oral Oral   SpO2: 96% 98% 99% 94%  Weight:   74.5 kg   Height:        Intake/Output Summary (Last 24 hours) at 08/03/2018 0740 Last data filed at 08/03/2018 0600 Gross per 24 hour  Intake 648.31 ml  Output 0 ml  Net 648.31 ml   Last 3 Weights 08/02/2018 08/01/2018 08/01/2018  Weight (lbs) 164 lb 3.9 oz 164 lb 10.9 oz 164 lb 10.9 oz  Weight (kg) 74.5 kg 74.7 kg 74.7 kg      Telemetry    SR  - Personally Reviewed  ECG    None  - Personally Reviewed  Physical Exam   GEN: Pt is mildly distressed due to itchy skin Neck: No JVD Cardiac: RRR, no murmurs, rubs, or gallops.  Respiratory: Clear to auscultation bilaterally. GI: Deferred   Apppears soft  Port sites OK   MS: No edema; s/p L BKA  . Neuro:  Nonfocal  Psych: Normal affect   Labs    High Sensitivity Troponin:   Recent Labs  Lab 07/10/18 0455 07/10/18 1851  TROPONINIHS 17 17      Cardiac EnzymesNo results for input(s): TROPONINI  in the last 168 hours. No results for input(s): TROPIPOC in the last 168 hours.   Chemistry Recent Labs  Lab 07/31/18 1221 08/01/18 0019 08/02/18 0602 08/03/18 0547  NA 140 140 134* 134*  K 4.6 4.8 3.9 4.3  CL 101 101 96* 95*  CO2 26 28 26 24   GLUCOSE 132* 90 81 103*  BUN 25* 28* 16 23*  CREATININE 7.41* 7.73* 5.48* 6.50*  CALCIUM 9.5 9.6 8.9 9.3  PROT 7.1 6.9 6.3*  --   ALBUMIN 3.7 3.5 3.1*  --   AST 16 15 12*  --   ALT 11 11 9   --   ALKPHOS 88 78 69  --   BILITOT 1.1 0.9 0.7  --   GFRNONAA 7* 7* 10* 9*  GFRAA 8* 8* 12* 10*  ANIONGAP 13 11 12 15      Hematology Recent Labs  Lab 08/01/18 0019 08/02/18 0602 08/03/18 0547  WBC 4.1 3.1* 6.0  RBC 3.37* 3.30* 3.46*  HGB 10.1* 10.0* 10.5*  HCT 32.8* 31.5* 33.0*  MCV 97.3 95.5 95.4  MCH 30.0 30.3 30.3  MCHC 30.8 31.7 31.8  RDW 14.6 14.6 14.3  PLT 125* 88* 91*    BNPNo results for input(s): BNP, PROBNP in the last 168 hours.   DDimer No results for input(s): DDIMER in the last 168 hours.   Radiology    Dg Cholangiogram Operative  Result Date: 08/02/2018 CLINICAL DATA:  Intraoperative cholangiogram during laparoscopic cholecystectomy. EXAM: INTRAOPERATIVE CHOLANGIOGRAM FLUOROSCOPY TIME:  10 seconds COMPARISON:  Right upper quadrant abdominal ultrasound-07/31/2018 FINDINGS: A single spot intraoperative cholangiographic image of the right upper abdominal quadrant during laparoscopic cholecystectomy are provided for review. Surgical clips overlie the expected location of the gallbladder fossa. Contrast injection demonstrates selective cannulation of the central aspect of the cystic duct. There is passage of contrast through the central aspect of the cystic duct with filling of a non dilated common bile duct. There is passage of contrast though the CBD and into the descending portion of the duodenum. There is minimal reflux of injected contrast into the common hepatic duct and central aspect of the non dilated intrahepatic  biliary system. There are no discrete filling defects within the opacified portions of the biliary system to suggest the presence of choledocholithiasis. IMPRESSION: No evidence of choledocholithiasis. Electronically Signed   By: Sandi Mariscal M.D.   On: 08/02/2018 12:51    Cardiac Studies   none  Patient Profile     58 y.o. male with a history of CAD s/p CABG in 2018 at [facility], PAD s/p bifurcation angioplasty of the anterior tibial artery with a TP trunk in 08/2016 and previous multiple left digit amputations, ESRD on hemodialysis, hypertension, type 2 diabetes mellitus with peripheral neuropathy, CVA, and tobacco use, who is being seen today for pre-operative evaluation at the request of Dr. Georgette Dover (Surgery).  Assessment & Plan    1  CAD   Pt is s/p CABG in 2018   No active symptoms prior to surgery   LVEF normal on echo in 2019    Nothing to sugg angina     2  HTN  Continue current regimen     3  HL Back on lipitor   WIll need to follow as outpt  4  Hx PAD   S/p L BKA   Has seen M Arida in past  5   Hx cholecystitis.   POD 1 from choley    Pt should follow up with cardiology  May prefer [facility]  WIll see  WIll sign off   Please call for questions  For questions or updates, please contact [place] Please consult [email or web address] for contact info under        Signed, Dorris Carnes, MD  08/03/2018, 7:40 AM

## 2018-08-03 NOTE — Progress Notes (Signed)
[name], male   DOB: December 30, 1960, 58 y.o.   MRN: [id number]    1 Day Post-Op  Subjective: Doing well with minimal pain.  Has only had clears so far and tolerating well.  No nausea.  Objective: Vital signs in last 24 hours: Temp:  [97.2 F (36.2 C)-98.5 F (36.9 C)] 98.1 F (36.7 C) (07/23 0405) Pulse Rate:  [56-74] 74 (07/23 0405) Resp:  [11-18] 18 (07/23 0405) BP: (115-139)/(75-91) 136/91 (07/23 0405) SpO2:  [94 %-99 %] 94 % (07/23 0405) Weight:  [74.5 kg] 74.5 kg (07/22 2020) Last BM Date: 08/01/18  Intake/Output from previous day: 07/22 0701 - 07/23 0700 In: 648.3 [I.V.:500; IV Piggyback:148.3] Out: 0  Intake/Output this shift: No intake/output data recorded.  PE: Abd: soft, minimally tender, incisions are c/d/i with gauze, tegaderm, and steri-strips in place.  +BS, ND  Lab Results:  Recent Labs    08/02/18 0602 08/03/18 0547  WBC 3.1* 6.0  HGB 10.0* 10.5*  HCT 31.5* 33.0*  PLT 88* 91*   BMET Recent Labs    08/02/18 0602 08/03/18 0547  NA 134* 134*  K 3.9 4.3  CL 96* 95*  CO2 26 24  GLUCOSE 81 103*  BUN 16 23*  CREATININE 5.48* 6.50*  CALCIUM 8.9 9.3   PT/INR No results for input(s): LABPROT, INR in the last 72 hours. CMP     Component Value Date/Time   NA 134 (L) 08/03/2018 0547   NA 141 08/17/2016 1203   K 4.3 08/03/2018 0547   CL 95 (L) 08/03/2018 0547   CO2 24 08/03/2018 0547   GLUCOSE 103 (H) 08/03/2018 0547   BUN 23 (H) 08/03/2018 0547   BUN 33 (H) 08/17/2016 1203   CREATININE 6.50 (H) 08/03/2018 0547   CALCIUM 9.3 08/03/2018 0547   PROT 6.3 (L) 08/02/2018 0602   PROT 7.1 06/25/2013 1007   ALBUMIN 3.1 (L) 08/02/2018 0602   ALBUMIN 4.0 06/25/2013 1007   AST 12 (L) 08/02/2018 0602   ALT 9 08/02/2018 0602   ALKPHOS 69 08/02/2018 0602   BILITOT 0.7 08/02/2018 0602   GFRNONAA 9 (L) 08/03/2018 0547   GFRAA 10 (L) 08/03/2018 0547   Lipase     Component Value Date/Time   LIPASE 26 07/31/2018 1221        Studies/Results: Dg Cholangiogram Operative  Result Date: 08/02/2018 CLINICAL DATA:  Intraoperative cholangiogram during laparoscopic cholecystectomy. EXAM: INTRAOPERATIVE CHOLANGIOGRAM FLUOROSCOPY TIME:  10 seconds COMPARISON:  Right upper quadrant abdominal ultrasound-07/31/2018 FINDINGS: A single spot intraoperative cholangiographic image of the right upper abdominal quadrant during laparoscopic cholecystectomy are provided for review. Surgical clips overlie the expected location of the gallbladder fossa. Contrast injection demonstrates selective cannulation of the central aspect of the cystic duct. There is passage of contrast through the central aspect of the cystic duct with filling of a non dilated common bile duct. There is passage of contrast though the CBD and into the descending portion of the duodenum. There is minimal reflux of injected contrast into the common hepatic duct and central aspect of the non dilated intrahepatic biliary system. There are no discrete filling defects within the opacified portions of the biliary system to suggest the presence of choledocholithiasis. IMPRESSION: No evidence of choledocholithiasis. Electronically Signed   By: Sandi Mariscal M.D.   On: 08/02/2018 12:51    Anti-infectives: Anti-infectives (From admission, onward)   Start     Dose/Rate Route Frequency Ordered Stop   07/31/18 2300  piperacillin-tazobactam (ZOSYN) IVPB 3.375 g  Status:  Discontinued     3.375 g 12.5 mL/hr over 240 Minutes Intravenous Every 12 hours 07/31/18 2214 08/02/18 1159   07/31/18 2000  cefTRIAXone (ROCEPHIN) 2 g in sodium chloride 0.9 % 100 mL IVPB  Status:  Discontinued     2 g 200 mL/hr over 30 Minutes Intravenous Every 24 hours 07/31/18 1959 07/31/18 2214       Assessment/Plan POD 1, s/p lap chole for acute cholecystitis -patient doing well surgically. -stable for DC home when medically stable -setting up follow up for this surgery and to discuss hernia repairs going  forward. -post op DC instructions discussed with patient.   FEN - renal/carb mod diet VTE - none currently ID - none   LOS: 3 days    Henreitta Cea , [facility] Surgery 08/03/2018, 9:55 AM Pager: [phone]

## 2018-08-03 NOTE — Progress Notes (Addendum)
[facility] Progress Note   Subjective:   Patient seen and examined at bedside in dialysis.  Reports pain is mostly well controlled with pain meds.  Would like to have pain completely controlled prior to d/c.  Dialysis tolerated well.   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 - no stump edema Dialysis Access: LU AVF cannulated   Filed Weights   08/01/18 2015 08/02/18 2020 08/03/18 0855  Weight: 74.7 kg 74.5 kg 70.4 kg    Intake/Output Summary (Last 24 hours) at 08/03/2018 1133 Last data filed at 08/03/2018 0600 Gross per 24 hour  Intake 148.31 ml  Output 0 ml  Net 148.31 ml    Additional Objective Labs: Basic Metabolic Panel: Recent Labs  Lab 08/01/18 0019 08/02/18 0602 08/03/18 0547  NA 140 134* 134*  K 4.8 3.9 4.3  CL 101 96* 95*  CO2 28 26 24   GLUCOSE 90 81 103*  BUN 28* 16 23*  CREATININE 7.73* 5.48* 6.50*  CALCIUM 9.6 8.9 9.3  PHOS 4.8*  --   --    Liver Function Tests: Recent Labs  Lab 07/31/18 1221 08/01/18 0019 08/02/18 0602  AST 16 15 12*  ALT 11 11 9   ALKPHOS 88 78 69  BILITOT 1.1 0.9 0.7  PROT 7.1 6.9 6.3*  ALBUMIN 3.7 3.5 3.1*   Recent Labs  Lab 07/31/18 1221  LIPASE 26   CBC: Recent Labs  Lab 07/31/18 1221 08/01/18 0019 08/02/18 0602 08/03/18 0547  WBC 4.7 4.1 3.1* 6.0  NEUTROABS 3.2  --  1.8  --   HGB 10.5* 10.1* 10.0* 10.5*  HCT 33.3* 32.8* 31.5* 33.0*  MCV 97.7 97.3 95.5 95.4  PLT 124* 125* 88* 91*   Blood Culture    Component Value Date/Time   SDES BLOOD RIGHT HAND 08/01/2018 0831   SPECREQUEST  08/01/2018 0831    BOTTLES DRAWN AEROBIC ONLY Blood  Culture results may not be optimal due to an inadequate volume of blood received in culture bottles   CULT  08/01/2018 0831    NO GROWTH 2 DAYS Performed at [facility], [address], [place], [place] [address]    REPTSTATUS PENDING 08/01/2018 0831   CBG: Recent Labs  Lab 08/02/18 1617 08/02/18 2021 08/03/18 0001 08/03/18 0405 08/03/18 0735  GLUCAP 181* 175* 72 101* 87   Studies/Results: Dg Cholangiogram Operative  Result Date: 08/02/2018 CLINICAL DATA:  Intraoperative cholangiogram during laparoscopic cholecystectomy. EXAM: INTRAOPERATIVE CHOLANGIOGRAM FLUOROSCOPY TIME:  10 seconds COMPARISON:  Right upper quadrant abdominal ultrasound-07/31/2018 FINDINGS: A single spot intraoperative cholangiographic image of the right upper abdominal quadrant during laparoscopic cholecystectomy are provided for review. Surgical clips overlie the expected location of the gallbladder fossa. Contrast injection demonstrates selective cannulation of the central aspect of the cystic duct. There is passage of contrast through the central aspect of the cystic duct with filling of a non dilated common bile duct. There is passage of contrast though the CBD and into the descending portion of the duodenum. There is minimal reflux of injected  contrast into the common hepatic duct and central aspect of the non dilated intrahepatic biliary system. There are no discrete filling defects within the opacified portions of the biliary system to suggest the presence of choledocholithiasis. IMPRESSION: No evidence of choledocholithiasis. Electronically Signed   By: Sandi Mariscal M.D.   On: 08/02/2018 12:51    Medications: . sodium chloride    . sodium chloride    . sodium chloride     . acetaminophen  1,000 mg Oral Q6H  . amLODipine  5 mg Oral Daily  . carvedilol  12.5 mg Oral BID WC  . Chlorhexidine Gluconate Cloth  6 each Topical Q0600  . darbepoetin (ARANESP) injection - DIALYSIS  60 mcg Intravenous Q Thu-HD   . insulin aspart  0-9 Units Subcutaneous Q4H  . sodium chloride flush  3 mL Intravenous Q12H    Dialysis Orders: MWF -NW 4hrs, BFR400, DFRAF2.0, EDW 70.5kg,2K/2Ca  Access:LU AVF Heparin4000 unit bolus Mircera30mcg q2wks - last 6/23 Hectorol28mcg IV qHD   Assessment/Plan: 1. Acute cholecystitis - cholecystectomy on 7/22.  Surgery cleared for d/c. 2. ESRD - On HD MWF. K4.3.HD today off schedule d/t surgery.  Plan for HD tomorrow per regular schedule. If to be d/c can go to OP unit.  3. Hypertension/volume - BP well controlled.  Trace edema on exam.  At EDW, patient feels like he has loss weight.  Plan for UF 2L today as tolerated. May need new EDW at d/c.  4. Anemia of CKD - Hgb 10.5, stable post surgery.  ESA today w/HD. 5. Secondary Hyperparathyroidism - Ca and Phos at goal. Continue VDRA and binders once eating. 6. Nutrition - Renal diet w/fluid restrictions, vit 7. CAD s/p CABG 8. PAD  9. DMT2 10. Inguinal hernias - Surgery consulted during last admission, will need elective repair   Jen Mow, PA-C [facility] Pager: [phone] 08/03/2018,11:33 AM  LOS: 3 days   Pt seen, examined and agree w A/P as above. Looks like possible dc today.  Kelly Splinter  MD 08/03/2018, 12:04 PM

## 2018-08-04 DIAGNOSIS — I2510 Atherosclerotic heart disease of native coronary artery without angina pectoris: Secondary | ICD-10-CM | POA: Diagnosis not present

## 2018-08-04 DIAGNOSIS — E0822 Diabetes mellitus due to underlying condition with diabetic chronic kidney disease: Secondary | ICD-10-CM | POA: Diagnosis not present

## 2018-08-04 DIAGNOSIS — K810 Acute cholecystitis: Secondary | ICD-10-CM | POA: Diagnosis not present

## 2018-08-04 DIAGNOSIS — N186 End stage renal disease: Secondary | ICD-10-CM | POA: Diagnosis not present

## 2018-08-04 LAB — GLUCOSE, CAPILLARY
Glucose-Capillary: 100 mg/dL — ABNORMAL HIGH (ref 70–99)
Glucose-Capillary: 122 mg/dL — ABNORMAL HIGH (ref 70–99)
Glucose-Capillary: 84 mg/dL (ref 70–99)
Glucose-Capillary: 87 mg/dL (ref 70–99)
Glucose-Capillary: 94 mg/dL (ref 70–99)

## 2018-08-04 MED ORDER — HYDROCODONE-ACETAMINOPHEN 5-325 MG PO TABS: 1.0000 | ORAL_TABLET | Freq: Four times a day (QID) | ORAL | 0 refills | Status: DC | PRN

## 2018-08-04 MED ORDER — SODIUM CHLORIDE 0.9 % IV SOLN: 100.0000 mL | INTRAVENOUS | Status: DC | PRN

## 2018-08-04 MED ORDER — DARBEPOETIN ALFA 60 MCG/0.3ML IJ SOSY
60.0000 ug | PREFILLED_SYRINGE | Freq: Once | INTRAMUSCULAR | Status: AC
  Administered 2018-08-04: 12:00:00 60 ug via INTRAVENOUS
  Filled 2018-08-04: qty 0.3

## 2018-08-04 MED ORDER — PENTAFLUOROPROP-TETRAFLUOROETH EX AERO: 1.0000 "application " | INHALATION_SPRAY | CUTANEOUS | Status: DC | PRN

## 2018-08-04 MED ORDER — LIDOCAINE HCL (PF) 1 % IJ SOLN: 5.0000 mL | INTRAMUSCULAR | Status: DC | PRN

## 2018-08-04 MED ORDER — HEPARIN SODIUM (PORCINE) 1000 UNIT/ML DIALYSIS: 1000.0000 [IU] | INTRAMUSCULAR | Status: DC | PRN

## 2018-08-04 MED ORDER — ALTEPLASE 2 MG IJ SOLR: 2.0000 mg | Freq: Once | INTRAMUSCULAR | Status: DC | PRN

## 2018-08-04 MED ORDER — LIDOCAINE-PRILOCAINE 2.5-2.5 % EX CREA: 1.0000 "application " | TOPICAL_CREAM | CUTANEOUS | Status: DC | PRN

## 2018-08-04 MED ORDER — DARBEPOETIN ALFA 60 MCG/0.3ML IJ SOSY
PREFILLED_SYRINGE | INTRAMUSCULAR | Status: AC
  Administered 2018-08-04: 60 ug via INTRAVENOUS
  Filled 2018-08-04: qty 0.3

## 2018-08-04 NOTE — Plan of Care (Signed)
  Problem: Education: Goal: Knowledge of General Education information will improve Description: Including pain rating scale, medication(s)/side effects and non-pharmacologic comfort measures Outcome: Progressing   Problem: Activity: Goal: Risk for activity intolerance will decrease Outcome: Progressing   

## 2018-08-04 NOTE — Care Management Important Message (Signed)
Important Message  Patient Details  Name: Matthew Kline MRN: [id number] Date of Birth: April 10, 1960   Medicare Important Message Given:  Yes     Thao Bauza 08/04/2018, 2:05 PM

## 2018-08-04 NOTE — Discharge Summary (Signed)
Discharge Summary  Matthew Kline EHO:[id number] DOB: [birth year]  PCP: [facility]  Admit date: 07/31/2018 Discharge date: 08/04/2018  Time spent: 40 mins  Recommendations for Outpatient Follow-up:  1. PCP 2. General surgery  Discharge Diagnoses:  Active Hospital Problems   Diagnosis Date Noted   Acute cholecystitis 07/31/2018   Prolonged QT interval 07/31/2018   Hx of AKA (above knee amputation), left (HCC)    CAD in native artery 05/18/2016   ESRD on dialysis ([place]) 07/14/2014   Anemia in chronic kidney disease 07/14/2014   Diabetes mellitus with renal manifestations, controlled ([place]) 10/24/2013   Essential hypertension, benign 12/19/2012   End stage renal disease ([place]) 10/02/2012   GERD (gastroesophageal reflux disease) 09/19/2012   Essential hypertension 05/10/2012   Diabetes mellitus due to underlying condition ([place]) 05/10/2012   Hyperlipidemia LDL goal <100 05/10/2012    Resolved Hospital Problems  No resolved problems to display.    Discharge Condition: Stable  Diet recommendation: Heart healthy  Vitals:   08/04/18 1100 08/04/18 1240  BP: 132/74 (!) 158/93  Pulse: 84 87  Resp: 14 20  Temp: 97.9 F (36.6 C) 98.5 F (36.9 C)  SpO2:  100%    History of present illness:  58 year old male with history of anemia, anxiety, end-stage renal disease on dialysis, GERD, diabetes mellitus type 2, left AKA, chronic abdominal pain, inguinal hernia, CAD status post CABG, polymyalgia rheumatica presented on 07/31/2018 with abdominal pain.  He was found to have acute cholecystitis.  General surgery was consulted.  He was started on IV antibiotics.   Today, patient denies any new complaints, does report chronic diarrhea that has been ongoing prior to this admission.  Patient advised to follow-up with PCP closely as well as general surgery for postop follow-up as well as evaluation for his possible inguinal hernia repair.  Patient denies any  worsening abdominal pain, nausea/vomiting, chest pain, fever/chills.  Hospital Course:  Active Problems:   Essential hypertension   Diabetes mellitus due to underlying condition ([place])   Hyperlipidemia LDL goal <100   GERD (gastroesophageal reflux disease)   End stage renal disease (HCC)   Essential hypertension, benign   Diabetes mellitus with renal manifestations, controlled ([place])   ESRD on dialysis ([place])   Anemia in chronic kidney disease   CAD in native artery   Hx of AKA (above knee amputation), left (HCC)   Acute cholecystitis   Prolonged QT interval  Acute calculus cholecystitis s/p Lap chole on 08/02/18 General surgery on board Post op care as gen surg  End-stage renal disease on hemodialysis Nephrology on board, Dialysis as per nephrology schedule  CAD with history of CABG Cardiology consulted for cardiology clearance Continue Coreg, asa  Diabetes mellitus type 2, diet controlled Hemoglobin A1c 5.7.  Continue home regimen  Hypertension Monitor blood pressure.  Continue Coreg and amlodipine.  Anemia of chronic kidney disease  Hemoglobin stable.  Monitor  Thrombocytopenia Questionable cause.  Stable.  No signs of bleeding  Inguinal hernias Outpatient elective repair as per general surgery.           Malnutrition Type:      Malnutrition Characteristics:      Nutrition Interventions:      Estimated body mass index is 21.43 kg/m as calculated from the following:   Height as of this encounter: 5\' 11"  (1.803 m).   Weight as of this encounter: 69.7 kg.    Procedures:  Lap chole  Consultations:  General surgery  Nephrology  Discharge Exam: BP Marland Kitchen)  158/93 (BP Location: Right Arm)    Pulse 87    Temp 98.5 F (36.9 C) (Oral)    Resp 20    Ht 5\' 11"  (1.803 m)    Wt 69.7 kg    SpO2 100%    BMI 21.43 kg/m   General: NAD  Cardiovascular: S1, S2 present Respiratory: CTA B  Discharge Instructions You were cared for by a  hospitalist during your hospital stay. If you have any questions about your discharge medications or the care you received while you were in the hospital after you are discharged, you can call the unit and asked to speak with the hospitalist on call if the hospitalist that took care of you is not available. Once you are discharged, your primary care physician will handle any further medical issues. Please note that NO REFILLS for any discharge medications will be authorized once you are discharged, as it is imperative that you return to your primary care physician (or establish a relationship with a primary care physician if you do not have one) for your aftercare needs so that they can reassess your need for medications and monitor your lab values.   Allergies as of 08/04/2018      Reactions   Morphine And Related Other (See Comments)   hallucinations   Tygacil [tigecycline] Nausea And Vomiting, Other (See Comments)   Makes him feel crazy   Imodium [loperamide] Itching      Medication List    STOP taking these medications   feeding supplement (NEPRO CARB STEADY) Liqd   feeding supplement (PRO-STAT SUGAR FREE 64) Liqd   ferric citrate 1 GM 210 MG(Fe) tablet Commonly known as: AURYXIA   gabapentin 300 MG capsule Commonly known as: NEURONTIN   montelukast 10 MG tablet Commonly known as: SINGULAIR   ondansetron 4 MG disintegrating tablet Commonly known as: Zofran ODT   pentoxifylline 400 MG CR tablet Commonly known as: TRENTAL   traMADol 50 MG tablet Commonly known as: ULTRAM     TAKE these medications   amLODipine 5 MG tablet Commonly known as: NORVASC Take 5 mg by mouth daily.   aspirin 81 MG EC tablet Take 2 tablets (162 mg total) by mouth daily. What changed: how much to take   carvedilol 12.5 MG tablet Commonly known as: COREG Take 1 tablet (12.5 mg total) by mouth 2 (two) times daily with a meal.   Darbepoetin Alfa 150 MCG/0.3ML Sosy injection Commonly known as:  ARANESP Inject 0.3 mLs (150 mcg total) into the vein every Friday with hemodialysis.   dicyclomine 20 MG tablet Commonly known as: BENTYL Take 1 tablet (20 mg total) by mouth every 6 (six) hours as needed for spasms (abdominal pain).   HYDROcodone-acetaminophen 5-325 MG tablet Commonly known as: NORCO/VICODIN Take 1 tablet by mouth every 6 (six) hours as needed for moderate pain.   hydrOXYzine 50 MG tablet Commonly known as: ATARAX/VISTARIL Take 50 mg by mouth 3 (three) times daily as needed for itching.   multivitamin Tabs tablet Take 1 tablet by mouth at bedtime.   sevelamer carbonate 800 MG tablet Commonly known as: RENVELA Take 32,000 mg by mouth 3 (three) times daily with meals.   temazepam 30 MG capsule Commonly known as: RESTORIL Take 30 mg by mouth at bedtime as needed for sleep.            Durable Medical Equipment  (From admission, onward)         Start     Ordered  08/04/18 0742  For home use only DME Walker rolling  Once    Question:  Patient needs a walker to treat with the following condition  Answer:  Weakness   08/04/18 0742         Allergies  Allergen Reactions   Morphine And Related Other (See Comments)    hallucinations   Tygacil [Tigecycline] Nausea And Vomiting and Other (See Comments)    Makes him feel crazy   Imodium [Loperamide] Itching   Follow-up [facility] Follow up.   Why: physical therapy  Contact information: [place] [address] [phone]        [facility] Follow up.   Why: also know as [employer] - provided walker Contact information: [address] [place] [address] [phone]        Coralie Keens, MD Follow up on 08/10/2018.   Specialty: General Surgery Why: 10:20am, arrive by 9:50am for paperwork and check in.  This will be a visit for your hernias to be evaluated as well as follow up for your gallbladder  surgery. Contact information: [address] [place] [address] [place], [place] Schedule an appointment as soon as possible for a visit in 1 week(s).   Contact information: [place] [address] [phone]            The results of significant diagnostics from this hospitalization (including imaging, microbiology, ancillary and laboratory) are listed below for reference.    Significant Diagnostic Studies: Ct Abdomen Pelvis Wo Contrast  Result Date: 07/09/2018 CLINICAL DATA:  Abdominal pain and vomiting 2 days. Bowel obstruction. Inguinal hernia. EXAM: CT ABDOMEN AND PELVIS WITHOUT CONTRAST TECHNIQUE: Multidetector CT imaging of the abdomen and pelvis was performed following the standard protocol without IV contrast. COMPARISON:  01/02/2018 FINDINGS: Lower chest: New small bilateral pleural effusions and bibasilar atelectasis. Hepatobiliary: No mass visualized on this unenhanced exam. Tiny calcified gallstones are again seen, without definite evidence of cholecystitis. Pancreas: No mass or inflammatory process visualized on this unenhanced exam. Spleen:  Within normal limits in size. Adrenals/Urinary tract: Stable bilateral diffuse renal parenchymal atrophy. No evidence of renal masses or hydronephrosis. Diffuse renal vascular calcification noted. Stomach/Bowel: No evidence of obstruction or focal inflammatory process. A moderate size right inguinal hernia is seen containing several small bowel loops, however there is no evidence of obstruction or strangulation. A small left inguinal hernia is seen containing only fat. Vascular/Lymphatic: No pathologically enlarged lymph nodes identified. No evidence of abdominal aortic aneurysm. Aortic atherosclerosis. Reproductive:  No mass or other significant abnormality. Other: Mild ascites is new since previous study. Diffuse mesenteric and body wall edema is also new since prior exam.  Musculoskeletal:  No suspicious bone lesions identified. IMPRESSION: 1. New mild ascites, diffuse mesenteric and body wall edema, and small bilateral pleural effusions, consistent with 3rd spacing. No focal inflammatory process or mass identified. 2. Increased sizes of right inguinal hernia containing several small bowel loops, and left inguinal hernia containing only fat. No evidence of bowel obstruction or strangulation. 3. Cholelithiasis. No radiographic evidence of cholecystitis. Electronically Signed   By: Marlaine Hind M.D.   On: 07/09/2018 21:13   Dg Cholangiogram Operative  Result Date: 08/02/2018 CLINICAL DATA:  Intraoperative cholangiogram during laparoscopic cholecystectomy. EXAM: INTRAOPERATIVE CHOLANGIOGRAM FLUOROSCOPY TIME:  10 seconds COMPARISON:  Right upper quadrant abdominal ultrasound-07/31/2018 FINDINGS: A single spot intraoperative  cholangiographic image of the right upper abdominal quadrant during laparoscopic cholecystectomy are provided for review. Surgical clips overlie the expected location of the gallbladder fossa. Contrast injection demonstrates selective cannulation of the central aspect of the cystic duct. There is passage of contrast through the central aspect of the cystic duct with filling of a non dilated common bile duct. There is passage of contrast though the CBD and into the descending portion of the duodenum. There is minimal reflux of injected contrast into the common hepatic duct and central aspect of the non dilated intrahepatic biliary system. There are no discrete filling defects within the opacified portions of the biliary system to suggest the presence of choledocholithiasis. IMPRESSION: No evidence of choledocholithiasis. Electronically Signed   By: Sandi Mariscal M.D.   On: 08/02/2018 12:51   Nm Hepato W/eject Fract  Result Date: 07/10/2018 CLINICAL DATA:  Cholelithiasis EXAM: NUCLEAR MEDICINE HEPATOBILIARY IMAGING WITH GALLBLADDER EF TECHNIQUE: Sequential images  of the abdomen were obtained out to 60 minutes following intravenous administration of radiopharmaceutical. After oral ingestion of Ensure, gallbladder ejection fraction was determined. At 60 min, normal ejection fraction is greater than 33%. RADIOPHARMACEUTICALS:  Five mCi Tc-6m  Choletec IV COMPARISON:  Abdominal CT from yesterday FINDINGS: Prompt uptake and biliary excretion of activity by the liver is seen. Gallbladder activity is visualized, consistent with patency of cystic duct. Biliary activity passes into small bowel, consistent with patent common bile duct. Calculated gallbladder ejection fraction is 27%. (Normal gallbladder ejection fraction with Ensure is greater than 33%.) IMPRESSION: Patent cystic and common bile ducts. Low gallbladder ejection fraction of 27%. Electronically Signed   By: Monte Fantasia M.D.   On: 07/10/2018 10:36   Dg Chest Portable 1 View  Result Date: 07/09/2018 CLINICAL DATA:  Pleural effusions, history CHF, coronary artery disease post MI, diabetes mellitus, end-stage renal disease on dialysis, hypertension EXAM: PORTABLE CHEST 1 VIEW COMPARISON:  Portable exam 2047 hours compared to 02/15/2018 FINDINGS: Lordotic positioning. Enlargement of cardiac silhouette with pulmonary vascular congestion post median sternotomy. LEFT basilar atelectasis. Lungs otherwise clear. No definite infiltrate, pleural effusion or pneumothorax. Bones demineralized. IMPRESSION: Enlargement of cardiac silhouette with pulmonary vascular congestion. LEFT basilar atelectasis. Electronically Signed   By: Lavonia Dana M.D.   On: 07/09/2018 23:01   Ct Angio Abd/pel W And/or Wo Contrast  Result Date: 07/09/2018 CLINICAL DATA:  Abdominal pain.  Assess for mesenteric ischemia EXAM: CTA ABDOMEN AND PELVIS wITHOUT AND WITH CONTRAST TECHNIQUE: Multidetector CT imaging of the abdomen and pelvis was performed using the standard protocol during bolus administration of intravenous contrast. Multiplanar  reconstructed images and MIPs were obtained and reviewed to evaluate the vascular anatomy. CONTRAST:  161mL OMNIPAQUE IOHEXOL 350 MG/ML SOLN COMPARISON:  Noncontrast study performed earlier today. FINDINGS: VASCULAR Aorta: Heavily calcified.  No aneurysm or dissection. Celiac: Calcified, widely patent SMA: Calcified, widely patent Renals: Heavily calcified bilaterally. Probable hemodynamically significant stenosis within the proximal left renal artery, greater than 50%. IMA: Calcified with mild stenosis at the origin, likely approximately 50%. Inflow: Heavily calcified.  No aneurysm. Proximal Outflow: Heavily calcified. Vessels are poorly opacified which appears to be due to slow flow rather than focal stenosis. Veins: Grossly unremarkable. Review of the MIP images confirms the above findings. NON-VASCULAR Lower chest: Small to moderate bilateral pleural effusions. There is cardiomegaly. Bilateral lower lobe airspace opacities. Hepatobiliary: Small gallstones within the gallbladder. No focal hepatic abnormality. Pancreas: No focal abnormality or ductal dilatation. Spleen: No focal abnormality.  Normal size. Adrenals/Urinary Tract: Atrophic  kidneys compatible with end-stage renal disease. No adrenal mass. Urinary bladder is decompressed, grossly unremarkable. Stomach/Bowel: Stomach, large and small bowel grossly unremarkable. Lymphatic: No adenopathy Reproductive: Moderate free fluid in the cul-de-sac and adjacent to the liver and spleen. Other: Bilateral inguinal hernias. The right inguinal hernia contains small bowel loops. No evidence of obstruction. Left inguinal hernia contains fat. Musculoskeletal: No acute bony abnormality. IMPRESSION: VASCULAR Heavily calcified aorta and branch vessels. Mild stenosis at the origin of the IMA. Celiac and SMA appear widely patent. Heavily calcified renal arteries bilaterally likely with hemodynamically significant stenosis within the proximal left renal artery. NON-VASCULAR  Cholelithiasis. Moderate ascites. Bilateral inguinal hernias, containing small bowel on the right and fat on the left. No evidence of bowel obstruction. Electronically Signed   By: Rolm Baptise M.D.   On: 07/09/2018 23:42   US Abdomen Limited Ruq  Result Date: 07/31/2018 CLINICAL DATA:  Upper abdominal pain several months worse today. EXAM: ULTRASOUND ABDOMEN LIMITED RIGHT UPPER QUADRANT COMPARISON:  HIDA scan 07/10/2018 and CT 07/09/2018 FINDINGS: Gallbladder: Moderate cholelithiasis with largest stone measuring 1.4 cm. Positive sonographic Murphy sign with mild adjacent free fluid. Edematous gallbladder wall thickening measuring 7.2 mm which is nonspecific in the setting of mild ascites. Findings may be due to acute cholecystitis, although note the patient had a negative HIDA scan for acute cholecystitis 07/10/2018. Common bile duct: Diameter: 4.7 mm. Liver: No focal lesion identified. Within normal limits in parenchymal echogenicity. Portal vein is patent on color Doppler imaging with normal direction of blood flow towards the liver. IMPRESSION: Moderate cholelithiasis with positive sonographic Murphy sign. Nonspecific gallbladder wall thickening of 7.2 mm in the setting of mild ascites. Findings may be due to acute cholecystitis. Electronically Signed   By: Marin Olp M.D.   On: 07/31/2018 17:19    Microbiology: Recent Results (from the past 240 hour(s))  C difficile quick scan w PCR reflex     Status: None   Collection Time: 07/26/18  3:53 PM   Specimen: STOOL  Result Value Ref Range Status   C Diff antigen NEGATIVE NEGATIVE Final   C Diff toxin NEGATIVE NEGATIVE Final   C Diff interpretation No C. difficile detected.  Final    Comment: Performed at [facility], [place] [address] [place],  [address]  Gastrointestinal Panel by PCR , Stool     Status: None   Collection Time: 07/26/18  3:53 PM   Specimen: STOOL  Result Value Ref Range Status   Campylobacter species NOT DETECTED  NOT DETECTED Final   Plesimonas shigelloides NOT DETECTED NOT DETECTED Final   Salmonella species NOT DETECTED NOT DETECTED Final   Yersinia enterocolitica NOT DETECTED NOT DETECTED Final   Vibrio species NOT DETECTED NOT DETECTED Final   Vibrio cholerae NOT DETECTED NOT DETECTED Final   Enteroaggregative E coli (EAEC) NOT DETECTED NOT DETECTED Final   Enteropathogenic E coli (EPEC) NOT DETECTED NOT DETECTED Final   Enterotoxigenic E coli (ETEC) NOT DETECTED NOT DETECTED Final   Shiga like toxin producing E coli (STEC) NOT DETECTED NOT DETECTED Final   Shigella/Enteroinvasive E coli (EIEC) NOT DETECTED NOT DETECTED Final   Cryptosporidium NOT DETECTED NOT DETECTED Final   Cyclospora cayetanensis NOT DETECTED NOT DETECTED Final   Entamoeba histolytica NOT DETECTED NOT DETECTED Final   Giardia lamblia NOT DETECTED NOT DETECTED Final   Adenovirus F40/41 NOT DETECTED NOT DETECTED Final   Astrovirus NOT DETECTED NOT DETECTED Final   Norovirus GI/GII NOT DETECTED NOT DETECTED Final  Rotavirus A NOT DETECTED NOT DETECTED Final   Sapovirus (I, II, IV, and V) NOT DETECTED NOT DETECTED Final    Comment: Performed at [facility], [address] [place], [place] [address]  SARS Coronavirus 2 (CEPHEID - Performed in [facility] lab), Hosp Order     Status: None   Collection Time: 07/31/18  8:41 PM   Specimen: Nasopharyngeal Swab  Result Value Ref Range Status   SARS Coronavirus 2 NEGATIVE NEGATIVE Final    Comment: (NOTE) If result is NEGATIVE SARS-CoV-2 target nucleic acids are NOT DETECTED. The SARS-CoV-2 RNA is generally detectable in upper and lower  respiratory specimens during the acute phase of infection. The lowest  concentration of SARS-CoV-2 viral copies this assay can detect is 250  copies / mL. A negative result does not preclude SARS-CoV-2 infection  and should not be used as the sole basis for treatment or other  patient management decisions.  A negative  result may occur with  improper specimen collection / handling, submission of specimen other  than nasopharyngeal swab, presence of viral mutation(s) within the  areas targeted by this assay, and inadequate number of viral copies  (<250 copies / mL). A negative result must be combined with clinical  observations, patient history, and epidemiological information. If result is POSITIVE SARS-CoV-2 target nucleic acids are DETECTED. The SARS-CoV-2 RNA is generally detectable in upper and lower  respiratory specimens dur ing the acute phase of infection.  Positive  results are indicative of active infection with SARS-CoV-2.  Clinical  correlation with patient history and other diagnostic information is  necessary to determine patient infection status.  Positive results do  not rule out bacterial infection or co-infection with other viruses. If result is PRESUMPTIVE POSTIVE SARS-CoV-2 nucleic acids MAY BE PRESENT.   A presumptive positive result was obtained on the submitted specimen  and confirmed on repeat testing.  While 2019 novel coronavirus  (SARS-CoV-2) nucleic acids may be present in the submitted sample  additional confirmatory testing may be necessary for epidemiological  and / or clinical management purposes  to differentiate between  SARS-CoV-2 and other Sarbecovirus currently known to infect humans.  If clinically indicated additional testing with an alternate test  methodology [phone]) is advised. The SARS-CoV-2 RNA is generally  detectable in upper and lower respiratory sp ecimens during the acute  phase of infection. The expected result is Negative. Fact Sheet for Patients:  StrictlyIdeas.no Fact Sheet for Healthcare Providers: BankingDealers.co.za This test is not yet approved or cleared by the [place] FDA and has been authorized for detection and/or diagnosis of SARS-CoV-2 by FDA under an Emergency Use Authorization  (EUA).  This EUA will remain in effect (meaning this test can be used) for the duration of the COVID-19 declaration under Section 564(b)(1) of the Act, 21 U.S.C. section 360bbb-3(b)(1), unless the authorization is terminated or revoked sooner. Performed at [facility], [place] [address] [place], [place] [address]   Surgical pcr screen     Status: None   Collection Time: 07/31/18 11:09 PM   Specimen: Nasal Mucosa; Nasal Swab  Result Value Ref Range Status   MRSA, PCR NEGATIVE NEGATIVE Final   Staphylococcus aureus NEGATIVE NEGATIVE Final    Comment: (NOTE) The Xpert SA Assay (FDA approved for NASAL specimens in patients 74 years of age and older), is one component of a comprehensive surveillance program. It is not intended to diagnose infection nor to guide or monitor treatment. Performed at [facility], [place] [address], [place], [place] [address]   Culture, blood (routine x 2)     Status: None (Preliminary result)   Collection Time: 08/01/18  8:23 AM   Specimen: BLOOD RIGHT ARM  Result Value Ref Range Status   Specimen Description BLOOD RIGHT ARM  Final   Special Requests   Final    BOTTLES DRAWN AEROBIC AND ANAEROBIC Blood Culture adequate volume   Culture   Final    NO GROWTH 3 DAYS Performed at [facility], [address], [place], [place] [address]    Report Status PENDING  Incomplete  Culture, blood (routine x 2)     Status: None (Preliminary result)   Collection Time: 08/01/18  8:31 AM   Specimen: BLOOD RIGHT HAND  Result Value Ref Range Status   Specimen Description BLOOD RIGHT HAND  Final   Special Requests   Final    BOTTLES DRAWN AEROBIC ONLY Blood Culture results may not be optimal due to an inadequate volume of blood received in culture bottles   Culture   Final    NO GROWTH 3 DAYS Performed at [facility], [place] [address] [place], [place] [address]    Report Status PENDING  Incomplete     Labs: Basic Metabolic Panel: Recent Labs    Lab 07/31/18 1221 08/01/18 0019 08/02/18 0602 08/03/18 0547  NA 140 140 134* 134*  K 4.6 4.8 3.9 4.3  CL 101 101 96* 95*  CO2 26 28 26 24   GLUCOSE 132* 90 81 103*  BUN 25* 28* 16 23*  CREATININE 7.41* 7.73* 5.48* 6.50*  CALCIUM 9.5 9.6 8.9 9.3  MG  --  2.2 2.1  --   PHOS  --  4.8*  --   --    Liver Function Tests: Recent Labs  Lab 07/31/18 1221 08/01/18 0019 08/02/18 0602  AST 16 15 12*  ALT 11 11 9   ALKPHOS 88 78 69  BILITOT 1.1 0.9 0.7  PROT 7.1 6.9 6.3*  ALBUMIN 3.7 3.5 3.1*   Recent Labs  Lab 07/31/18 1221  LIPASE 26   No results for input(s): AMMONIA in the last 168 hours. CBC: Recent Labs  Lab 07/31/18 1221 08/01/18 0019 08/02/18 0602 08/03/18 0547  WBC 4.7 4.1 3.1* 6.0  NEUTROABS 3.2  --  1.8  --   HGB 10.5* 10.1* 10.0* 10.5*  HCT 33.3* 32.8* 31.5* 33.0*  MCV 97.7 97.3 95.5 95.4  PLT 124* 125* 88* 91*   Cardiac Enzymes: No results for input(s): CKTOTAL, CKMB, CKMBINDEX, TROPONINI in the last 168 hours. BNP: BNP (last 3 results) No results for input(s): BNP in the last 8760 hours.  ProBNP (last 3 results) No results for input(s): PROBNP in the last 8760 hours.  CBG: Recent Labs  Lab 08/03/18 2140 08/04/18 0032 08/04/18 0414 08/04/18 0749 08/04/18 1208  GLUCAP 140* 100* 84 87 94       Signed:  Alma Friendly, MD [facility] 08/04/2018, 4:06 PM

## 2018-08-04 NOTE — Care Management CC44 (Signed)
Condition Code 44 Documentation Completed  Patient Details  Name: Matthew Kline MRN: [id number] Date of Birth: 08-Aug-1960   Condition Code 44 given:  Yes Patient signature on Condition Code 44 notice:  Yes Documentation of 2 MD's agreement:  Yes Code 44 added to claim:  Yes    Bartholomew Crews, RN 08/04/2018, 12:53 PM

## 2018-08-04 NOTE — Progress Notes (Signed)
PT Cancellation Note  Patient Details Name: Matthew Kline MRN: [id number] DOB: [birth year]   Cancelled Treatment:     Pt declines therapy states he is getting around alright and is going home today. Will follow    Reinaldo Berber 08/04/2018, 4:32 PM

## 2018-08-04 NOTE — Progress Notes (Signed)
Matthew Kline to be discharged Home per MD order. Discussed prescriptions and follow up appointments with the patient. Prescriptions given to patient; medication list explained in detail. Patient verbalized understanding.  Skin clean, dry and intact without evidence of skin break down, no evidence of skin tears noted. IV catheter discontinued intact. Site without signs and symptoms of complications. Dressing and pressure applied. Pt denies pain at the site currently. No complaints noted.  Patient free of lines, drains, and wounds.   An After Visit Summary (AVS) was printed and given to the patient. Patient escorted via wheelchair, and discharged home via private auto.  Amaryllis Dyke, RN

## 2018-08-04 NOTE — Progress Notes (Signed)
OT Cancellation Note  Patient Details Name: Matthew Kline MRN: [id number] DOB: 1960-03-31   Cancelled Treatment:     PATIENT WAS AT HD.   [name] 08/04/2018, 11:54 AM

## 2018-08-04 NOTE — Progress Notes (Signed)
Pt tx ended at 11:00 d/t RO

## 2018-08-04 NOTE — TOC Transition Note (Signed)
Transition of Care [facility]) - CM/SW Discharge Note   Patient Details  Name: Matthew Kline MRN: [id number] Date of Birth: Mar 04, 1960  Transition of Care [facility]) CM/SW Contact:  Bartholomew Crews, RN Phone Number: [phone] 08/04/2018, 3:22 PM   Clinical Narrative:    Patient to transition home today. RW delivered to bedside by [employer]. [facility] notified. No other transition of care needs identified.   Final next level of care: [place] Barriers to Discharge: No Barriers Identified   Patient Goals and CMS Choice Patient states their goals for this hospitalization and ongoing recovery are:: home CMS Medicare.gov Compare Post Acute Care list provided to:: Patient Choice offered to / list presented to : Patient  Discharge Placement                       Discharge Plan and Services In-house Referral: NA Discharge Planning Services: CM Consult Post Acute Care Choice: Home Health, Durable Medical Equipment          DME Arranged: [place] rolling DME Agency: [employer] Date DME Agency Contacted: 08/02/18 Time DME Agency Contacted: 1528 Representative spoke with at DME Agency: [place]: PT [place]: [place] ([place]) Date [place]: 08/04/18 Time [place]: 1522 Representative spoke with at [place]: [place] ([place]) Interventions     Readmission Risk Interventions No flowsheet data found.

## 2018-08-04 NOTE — Care Management Obs Status (Signed)
[place] NOTIFICATION   Patient Details  Name: Stevens Magwood MRN: [id number] Date of Birth: October 22, 1960   Medicare Observation Status Notification Given:  Yes    Bartholomew Crews, RN 08/04/2018, 12:53 PM

## 2018-08-04 NOTE — Progress Notes (Addendum)
[facility] Progress Note   Subjective:  Seen on HD - no UF planned. Has tried eating, but having a lot of diarrhea. Abd pain mostly resolved. No CP/dyspnea.  Objective Vitals:   08/04/18 0830 08/04/18 0845 08/04/18 0900 08/04/18 0915  BP: 137/77 122/74 131/76 136/79  Pulse: 77 74 77 77  Resp: 18 14 16 16   Temp: 97.8 F (36.6 C)     TempSrc: Oral     SpO2: 95%  95%   Weight: 69.9 kg     Height:       Physical Exam General: Ill appearing man, NAD. Heart: RRR; no murmur Lungs: CTA anteriorly Abdomen: soft, mild tenderness. Scattered laparoscopic bandages Extremities: Trace RLE edema; L BKA without stump edema Dialysis Access: L AVF + thrill  Additional Objective Labs: Basic Metabolic Panel: Recent Labs  Lab 08/01/18 0019 08/02/18 0602 08/03/18 0547  NA 140 134* 134*  K 4.8 3.9 4.3  CL 101 96* 95*  CO2 28 26 24   GLUCOSE 90 81 103*  BUN 28* 16 23*  CREATININE 7.73* 5.48* 6.50*  CALCIUM 9.6 8.9 9.3  PHOS 4.8*  --   --    Liver Function Tests: Recent Labs  Lab 07/31/18 1221 08/01/18 0019 08/02/18 0602  AST 16 15 12*  ALT 11 11 9   ALKPHOS 88 78 69  BILITOT 1.1 0.9 0.7  PROT 7.1 6.9 6.3*  ALBUMIN 3.7 3.5 3.1*   Recent Labs  Lab 07/31/18 1221  LIPASE 26   CBC: Recent Labs  Lab 07/31/18 1221 08/01/18 0019 08/02/18 0602 08/03/18 0547  WBC 4.7 4.1 3.1* 6.0  NEUTROABS 3.2  --  1.8  --   HGB 10.5* 10.1* 10.0* 10.5*  HCT 33.3* 32.8* 31.5* 33.0*  MCV 97.7 97.3 95.5 95.4  PLT 124* 125* 88* 91*   Blood Culture    Component Value Date/Time   SDES BLOOD RIGHT HAND 08/01/2018 0831   SPECREQUEST  08/01/2018 0831    BOTTLES DRAWN AEROBIC ONLY Blood Culture results may not be optimal due to an inadequate volume of blood received in culture bottles   CULT  08/01/2018 0831    NO GROWTH 2 DAYS Performed at [facility], [place] [address] [place], [place] [address]    REPTSTATUS PENDING 08/01/2018 0831   CBG: Recent Labs  Lab  08/03/18 2003 08/03/18 2140 08/04/18 0032 08/04/18 0414 08/04/18 0749  GLUCAP 149* 140* 100* 84 87   Studies/Results: Dg Cholangiogram Operative  Result Date: 08/02/2018 CLINICAL DATA:  Intraoperative cholangiogram during laparoscopic cholecystectomy. EXAM: INTRAOPERATIVE CHOLANGIOGRAM FLUOROSCOPY TIME:  10 seconds COMPARISON:  Right upper quadrant abdominal ultrasound-07/31/2018 FINDINGS: A single spot intraoperative cholangiographic image of the right upper abdominal quadrant during laparoscopic cholecystectomy are provided for review. Surgical clips overlie the expected location of the gallbladder fossa. Contrast injection demonstrates selective cannulation of the central aspect of the cystic duct. There is passage of contrast through the central aspect of the cystic duct with filling of a non dilated common bile duct. There is passage of contrast though the CBD and into the descending portion of the duodenum. There is minimal reflux of injected contrast into the common hepatic duct and central aspect of the non dilated intrahepatic biliary system. There are no discrete filling defects within the opacified portions of the biliary system to suggest the presence of choledocholithiasis. IMPRESSION: No evidence of choledocholithiasis. Electronically Signed   By: Sandi Mariscal M.D.   On: 08/02/2018 12:51   Medications: . sodium chloride    .  sodium chloride    . sodium chloride     . acetaminophen  1,000 mg Oral Q6H  . amLODipine  5 mg Oral Daily  . carvedilol  12.5 mg Oral BID WC  . Chlorhexidine Gluconate Cloth  6 each Topical Q0600  . darbepoetin (ARANESP) injection - DIALYSIS  60 mcg Intravenous Q Thu-HD  . insulin aspart  0-9 Units Subcutaneous Q4H  . sodium chloride flush  3 mL Intravenous Q12H    Dialysis Orders: MWF -[facility] 4hrs, BFR400, DFRAF2.0, EDW 70.5kg,2K/2Ca, LUE AVF, Heparin4000 unit bolus - Mircera79mcg q2wks - last 6/23 - Hectorol18mcg IV  qHD  Assessment/Plan: 1. Acute cholecystitis: S/p cholecystectomy 7/22. Surgery cleared for d/c but having a lot of diarrhea.  2. ESRD: HD off schedule earlier this week, now back to MWF schedule. No UF today- under prior EDW and not eating well yet. 3. Hypertension/volume: BP stable, trace RLE edema. Likely lower EDW slightly at d/c. 4. Anemia of CKD: Hgb 10.5. Aranesp ordered yesterday, but missed - give today. 5. Secondary Hyperparathyroidism: Ca and Phos at goal. Continue VDRA and binders once eating. 6. Nutrition: Alb low, continue to [employer].  7. CAD s/p CABG 8. PAD  9. Inguinal hernias - Surgery consulted during last admission, will need elective repair in future.  Veneta Penton, PA-C 08/04/2018, 9:34 AM  [facility] Pager: [phone]  Pt seen, examined and agree w A/P as above. OK for dc from renal standpoint.  Kelly Splinter  MD 08/04/2018, 11:01 AM

## 2018-08-06 LAB — CULTURE, BLOOD (ROUTINE X 2)
Culture: NO GROWTH
Culture: NO GROWTH
Special Requests: ADEQUATE

## 2018-08-10 ENCOUNTER — Other Ambulatory Visit: Payer: Self-pay | Admitting: Surgery

## 2018-08-13 IMAGING — DX DG ANKLE COMPLETE 3+V*L*
3 series · 3 of 3 positions shown · non-contrast
Comparison: Foot films dictated separately.  Prior of 11/09/2016.

CLINICAL DATA: Patient reports recent fall, patient reports already
having swelling of his entire left leg, reports he has noticed more
swelling, reports severe pain to left side, reports not being able
to put pressure on left knee, general lef...*comment was truncated*

EXAM:
LEFT ANKLE COMPLETE - 3+ VIEW

[ankle ap]
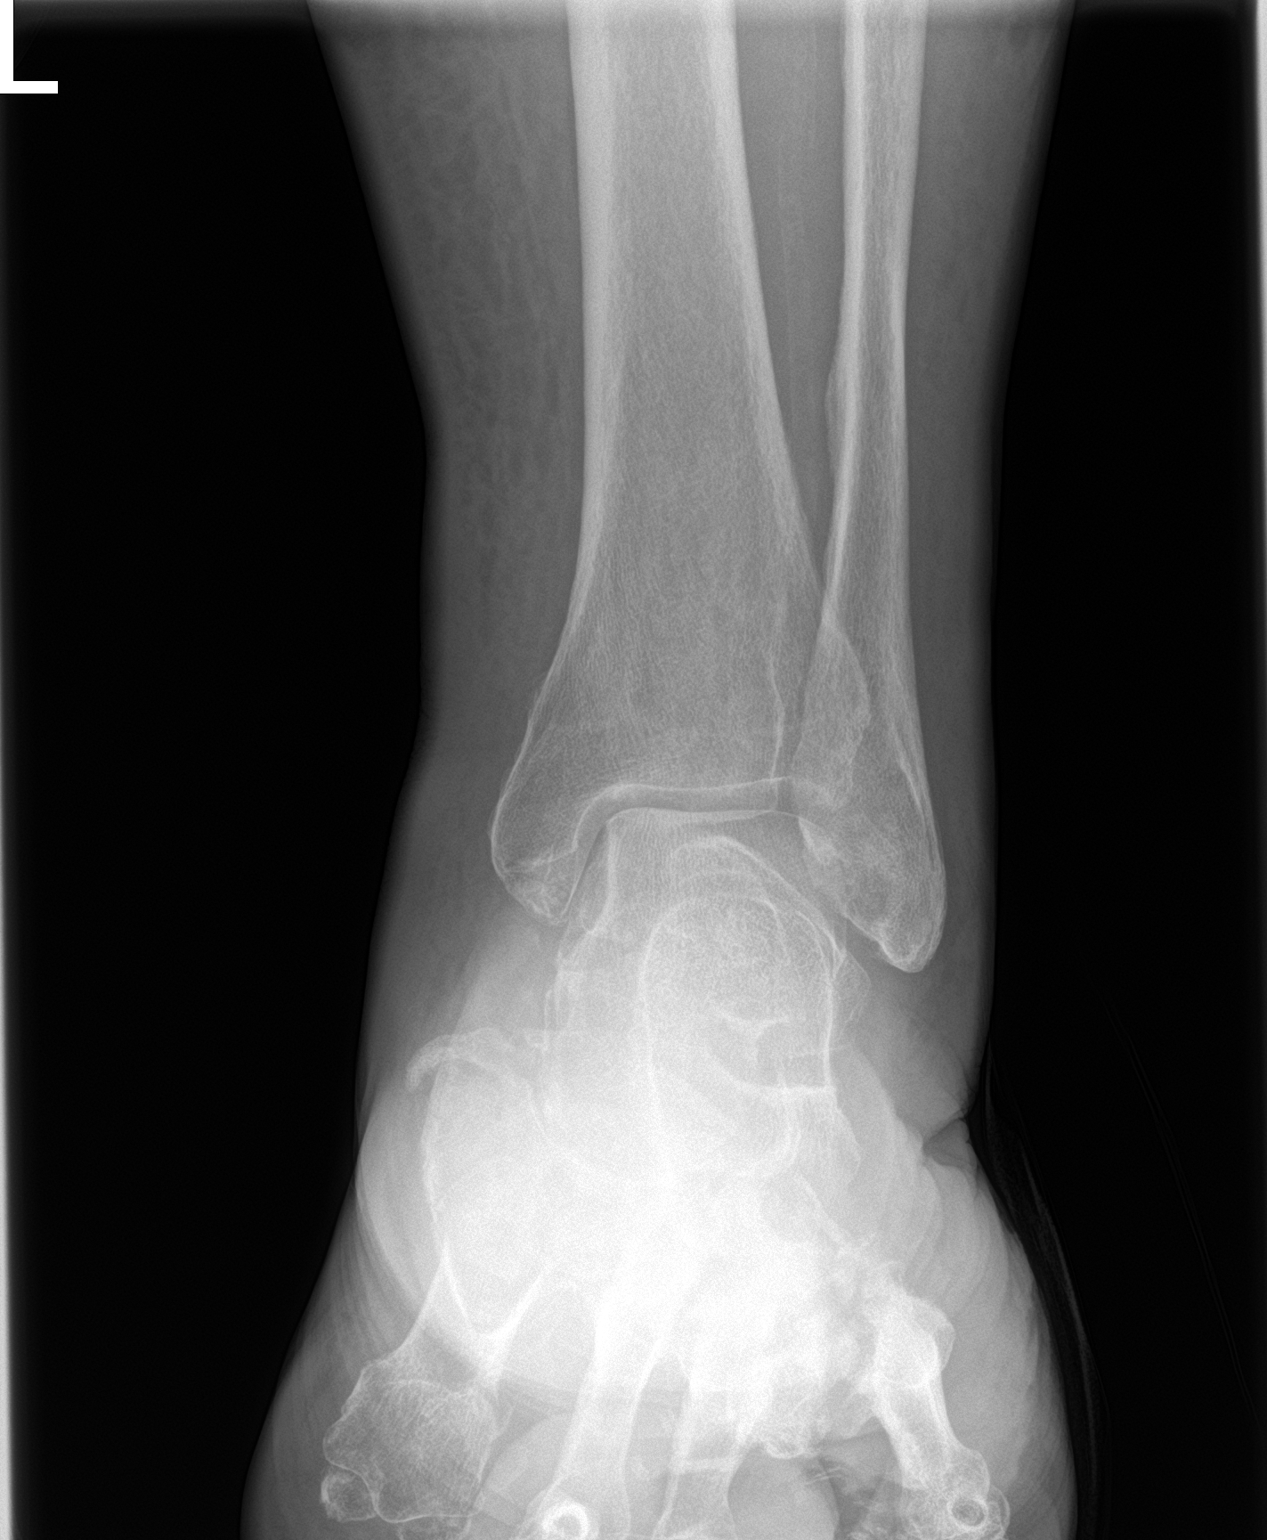

[ankle obl]
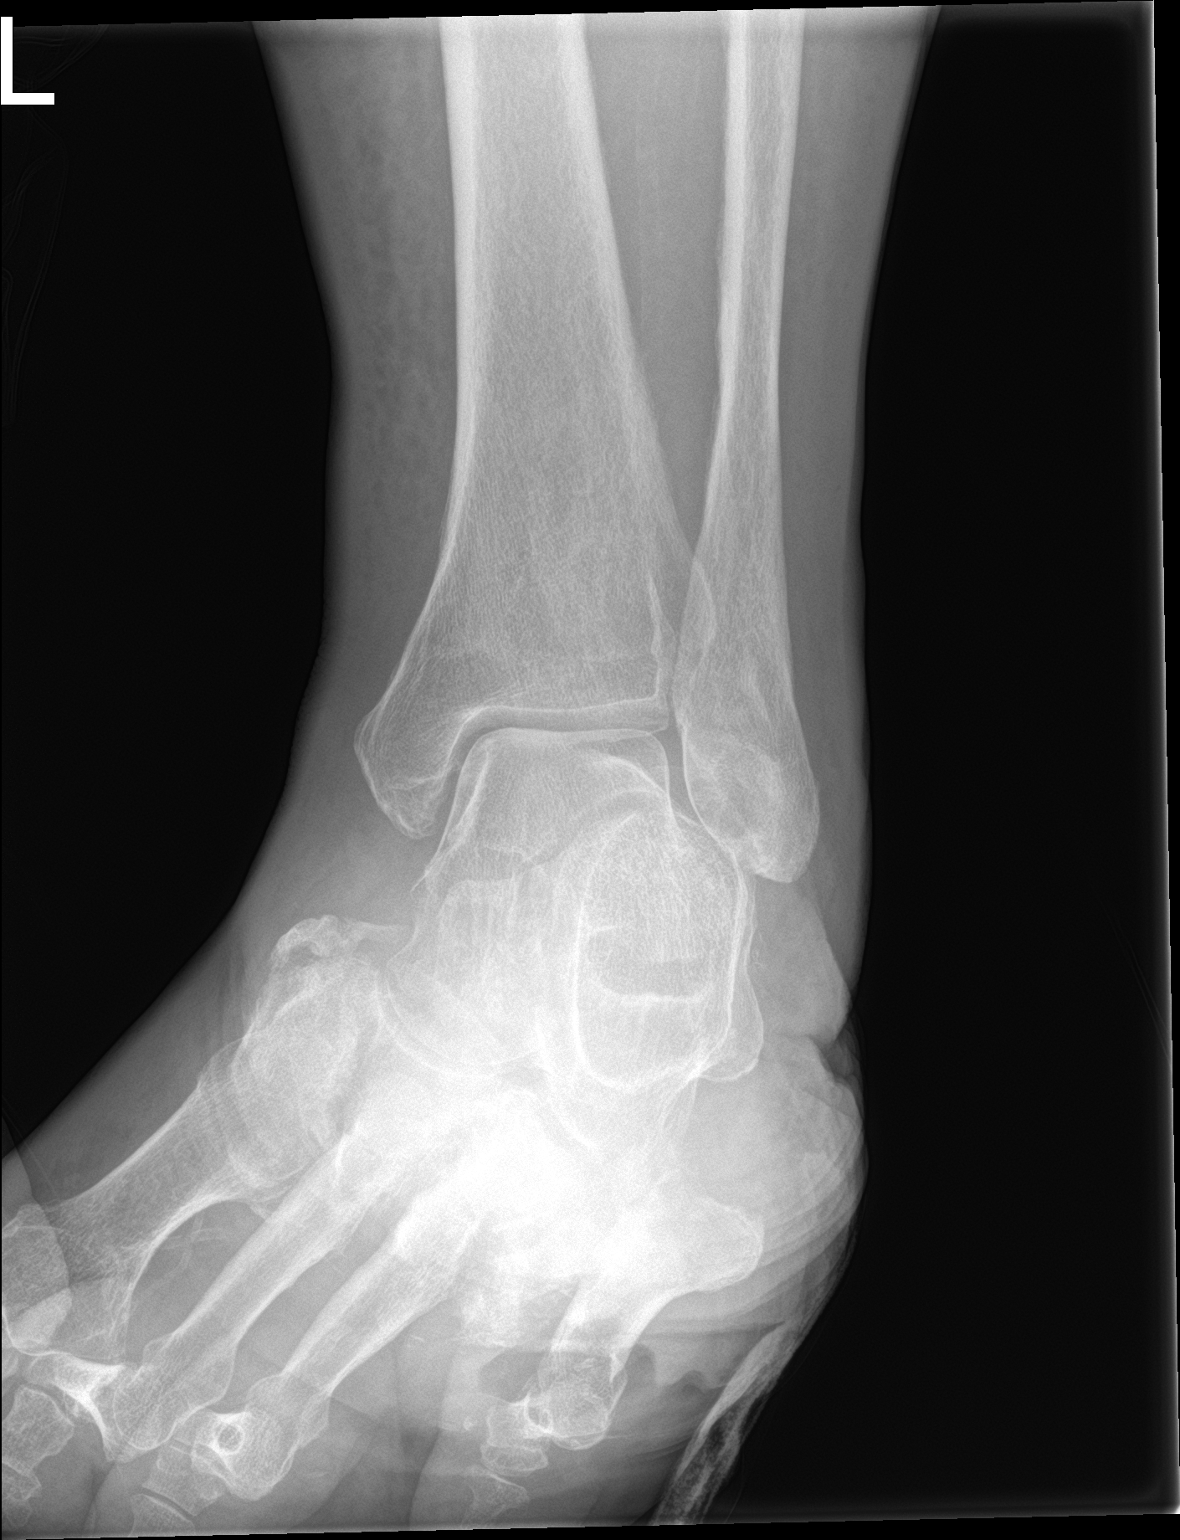

[ankle lat]
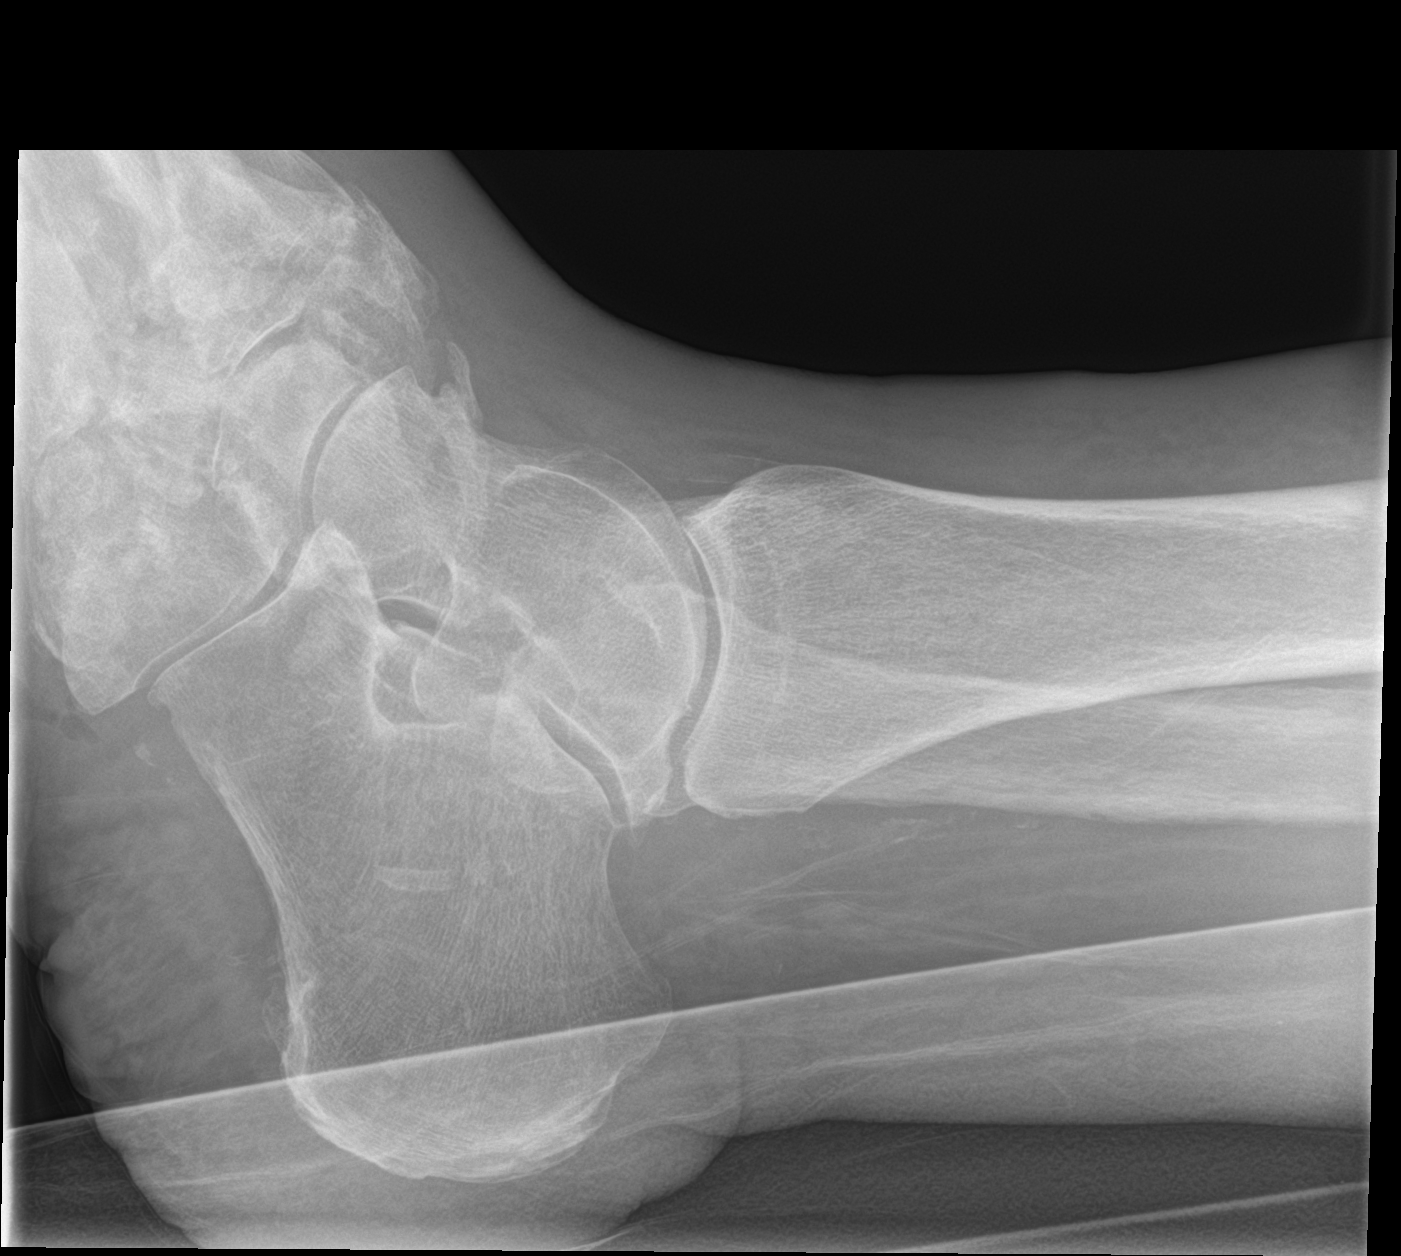

[3 of 3 positions shown; findings below may reference images not displayed]

FINDINGS: Diffuse soft tissue swelling about the ankle. Degenerate changes
about the hindfoot. No acute fracture or dislocation.
IMPRESSION: Soft tissue swelling with degenerative changes.  No acute findings.

## 2018-08-15 LAB — ECHOCARDIOGRAM COMPLETE
Height: 71 in
Weight: 2634.94 oz

## 2018-08-29 ENCOUNTER — Encounter: Payer: Self-pay | Admitting: Orthopedic Surgery

## 2018-08-29 ENCOUNTER — Other Ambulatory Visit: Payer: Self-pay | Admitting: Surgery

## 2018-08-29 ENCOUNTER — Ambulatory Visit (INDEPENDENT_AMBULATORY_CARE_PROVIDER_SITE_OTHER): Payer: Medicare HMO | Admitting: Orthopedic Surgery

## 2018-08-29 VITALS — Ht 71.0 in | Wt 153.0 lb

## 2018-08-29 DIAGNOSIS — I83012 Varicose veins of right lower extremity with ulcer of calf: Secondary | ICD-10-CM

## 2018-08-29 DIAGNOSIS — L97211 Non-pressure chronic ulcer of right calf limited to breakdown of skin: Secondary | ICD-10-CM

## 2018-08-29 DIAGNOSIS — S78112A Complete traumatic amputation at level between left hip and knee, initial encounter: Secondary | ICD-10-CM

## 2018-08-29 DIAGNOSIS — Z89612 Acquired absence of left leg above knee: Secondary | ICD-10-CM

## 2018-08-29 NOTE — Progress Notes (Signed)
Office Visit Note   Patient: Matthew Kline           Date of Birth: May 17, 1960           MRN: [id number] Visit Date: 08/29/2018              Requested by: [facility], [place] [address] [place],  [place] [address] PCP: [place]  Chief Complaint  Patient presents with   Left Leg - Follow-up    01/14/17 left prosthetic eval AKA      HPI: Patient is a 58 year old gentleman who presents for evaluation for his left above-the-knee amputation.  Patient states he is lost about 35 pounds since January he states that he is now riding down to the socket despite maximum sock ply layers that he is wearing and is having pain and difficulty with ambulation states he now has to use to [place] crutches to ambulate before he was independently ambulating.  Patient states he is scheduled for hernia repair surgery with Dr. Ninfa Linden.  He has venous stasis ulcers of the right leg secondary to trauma from shattered glass.  Assessment & Plan: Visit Diagnoses:  1. Unilateral AKA, left (HCC)   2. Venous stasis ulcer of right calf limited to breakdown of skin with varicose veins (HCC)     Plan: Recommend patient wear his compression stockings for the right lower extremity.  Patient is given a prescription for biotech for a new socket new liner materials and supplies.  Patient previously has been a K3 level ambulator and cannot do that with the current poor fitting of his socket.  Patient has the strength motivation and desire to resume his previous level of activity and should be able to do this with a new socket.  Follow-Up Instructions: Return if symptoms worsen or fail to improve.   Ortho Exam  Patient is alert, oriented, no adenopathy, well-dressed, normal affect, normal respiratory effort. Examination patient has difficulty ambulating with the [place] crutches due to the poor fitting socket he has no skin breakdown or ulceration.  He has had significant weight loss.   Examination of the right leg he has venous stasis insufficiency with pitting edema and ulcerations up and down the leg.  Imaging: No results found. No images are attached to the encounter.  Labs: Lab Results  Component Value Date   HGBA1C 5.7 (H) 08/01/2018   HGBA1C 5.3 07/10/2018   HGBA1C 5.7 (H) 02/07/2017   ESRSEDRATE 80 (H) 11/29/2015   ESRSEDRATE 75 (H) 08/07/2014   ESRSEDRATE >140 (H) 06/03/2014   CRP 9.8 (H) 07/14/2016   CRP 20.4 (H) 11/29/2015   CRP 5.6 (H) 08/07/2014   REPTSTATUS 08/06/2018 FINAL 08/01/2018   GRAMSTAIN  01/12/2017    RARE WBC PRESENT, PREDOMINANTLY MONONUCLEAR FEW GRAM POSITIVE COCCI IN PAIRS    CULT  08/01/2018    NO GROWTH 5 DAYS Performed at [facility], [place] [address], [place], [place] [address]    LABORGA STAPHYLOCOCCUS AUREUS 01/12/2017     Lab Results  Component Value Date   ALBUMIN 3.1 (L) 08/02/2018   ALBUMIN 3.5 08/01/2018   ALBUMIN 3.7 07/31/2018    Lab Results  Component Value Date   MG 2.1 08/02/2018   MG 2.2 08/01/2018   MG 1.9 01/10/2012   No results found for: VD25OH  No results found for: PREALBUMIN CBC EXTENDED Latest Ref Rng & Units 08/03/2018 08/02/2018 08/01/2018  WBC 4.0 - 10.5 K/uL 6.0 3.1(L) 4.1  RBC 4.22 - 5.81  MIL/uL 3.46(L) 3.30(L) 3.37(L)  HGB 13.0 - 17.0 g/dL 10.5(L) 10.0(L) 10.1(L)  HCT 39.0 - 52.0 % 33.0(L) 31.5(L) 32.8(L)  PLT 150 - 400 K/uL 91(L) 88(L) 125(L)  NEUTROABS 1.7 - 7.7 K/uL - 1.8 -  LYMPHSABS 0.7 - 4.0 K/uL - 0.8 -     Body mass index is 21.34 kg/m.  Orders:  No orders of the defined types were placed in this encounter.  No orders of the defined types were placed in this encounter.    Procedures: No procedures performed  Clinical Data: No additional findings.  ROS:  All other systems negative, except as noted in the HPI. Review of Systems  Objective: Vital Signs: Ht 5\' 11"  (1.803 m)    Wt 153 lb (69.4 kg)    BMI 21.34 kg/m   Specialty Comments:  No specialty  comments available.  PMFS History: Patient Active Problem List   Diagnosis Date Noted   Biliary colic 27/74/1287   Acute cholecystitis 07/31/2018   Prolonged QT interval 07/31/2018   Acute respiratory failure with hypoxia ([place]) 07/10/2018   Abdominal pain 07/10/2018   Bilateral recurrent inguinal hernia without obstruction or gangrene    Altered mental status    Cerebral thrombosis with cerebral infarction 02/06/2017   Cerebral embolism with cerebral infarction 02/06/2017   Hx of AKA (above knee amputation), left ([place])    Pressure injury of skin 01/30/2017   Acute lower UTI 86/76/7209   Acute metabolic encephalopathy 47/09/6281   UTI (urinary tract infection) 01/29/2017   Quadriceps muscle rupture, left, initial encounter    Fall 01/09/2017   Gait disturbance 01/09/2017   Staphylococcus aureus infection 01/09/2017   Infection of prosthetic left knee joint ([place]) 01/09/2017   Fever, unknown origin 11/09/2016   Critical lower limb ischemia 08/25/2016   Ulcer of left midfoot with fat layer exposed ([place]) 08/13/2016   Diabetic ulcer of left midfoot associated with type 2 diabetes mellitus, with fat layer exposed ([place]) 07/25/2016   Peripheral neuropathy 07/22/2016   Tobacco abuse 07/22/2016   CAD in native artery 05/18/2016   CAD, multiple vessel 05/11/2016   Positive cardiac stress test 05/11/2016   Abnormal stress test 04/30/2016   Pre-transplant evaluation for kidney transplant 04/30/2016   S/P revision of total knee 11/26/2015   Pain in the chest    Acute on chronic diastolic heart failure (HCC) 07/05/2015   Volume overload 07/04/2015   Shortness of breath 07/04/2015   Hypoxemia 07/04/2015   Elevated troponin    End-stage renal disease on hemodialysis (HCC)    Hypervolemia    Failed total knee arthroplasty, 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   Pyogenic bacterial arthritis of knee, left ([place]) 08/07/2014   Tachycardia 07/24/2014   Acute upper  respiratory infection 07/24/2014   ESRD on dialysis ([place]) 07/14/2014   Type II diabetes mellitus ([place]) 07/14/2014   Anemia in chronic kidney disease 07/14/2014   Congestive heart failure (CHF) ([place]) 07/13/2014   Surgical wound dehiscence 05/09/2014   Dehiscence of closure of skin 05/09/2014   Total knee replacement status 04/10/2014   Diabetes mellitus with renal manifestations, controlled ([place]) 10/24/2013   Hypertensive renal disease 06/27/2013   DM type 2 causing vascular disease ([place]) 06/27/2013   Erectile dysfunction 06/27/2013   Depression 06/27/2013   Claudication of left lower extremity ([place]) 12/19/2012   Essential hypertension, benign 12/19/2012   Sinusitis, acute maxillary 11/22/2012   Otitis, externa, infective 11/14/2012   Leg edema, left 11/14/2012   End stage renal disease ([place]) 10/02/2012   Controlled  type 2 DM with proteinuria or microalbuminuria 09/19/2012   GERD (gastroesophageal reflux disease) 09/19/2012   Leukocytosis 09/19/2012   Lacunar infarction ([place]) 08/17/2012   Polymyalgia rheumatica ([place]) 08/17/2012   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   Essential hypertension 05/10/2012   Vitamin D deficiency 05/10/2012   Diabetes mellitus due to underlying condition ([place]) 05/10/2012   Hyperlipidemia LDL goal <100 05/10/2012   Anemia of chronic disease 05/10/2012   Screening for prostate cancer 05/10/2012   Chronic kidney disease (CKD), stage IV (severe) ([place]) 05/10/2012   Peripheral autonomic neuropathy due to DM ([place]) 05/10/2012   Callus of foot 05/10/2012   Urgency of urination 05/10/2012   Hyperkalemia 05/10/2012   Candidiasis of the esophagus 10/12/2011   Internal hemorrhoids without mention of complication 16/60/6301   Pre-syncope 07/25/2009   DJD (degenerative joint disease) of cervical spine 02/17/2009   Past Medical History:  Diagnosis Date   Anemia, unspecified    Anxiety    Arthralgia 2010   polyarticular    Arthritis    "back, knees" (01/10/2017)   Cancer ([place])    "kidney area" (01/10/2017)   CHF (congestive heart failure) ([place]) 07/25/2009   denies   Chronic lower back pain    Coronary artery disease    Coughing    pt. reports that he has drainage from sinus infection   Diabetic foot ulcer ([place])    Diabetic neuropathy ([place])    ESRD (end stage renal disease) on dialysis ([place])    started 12/2012; "MWF; [facility] "  (01/10/2017)   GERD (gastroesophageal reflux disease)    hx "before I lost weight", no problem 9 years   Hemodialysis access site with mature fistula ([place])    Hemorrhoids, internal 10/2011   small   High cholesterol    History of blood transfusion    "related to the anemia"   Hypertension    Insomnia, unspecified    Lacunar infarction ([place]) 2006   RUE/RLE, speech   Long term (current) use of anticoagulants    Myocardial infarction ([place]) 1995   Orthostatic hypotension    Osteomyelitis of foot, left, acute ([place])    Other chronic postoperative pain    Pneumonia    "probably twice" (01/10/2017)   Polymyalgia rheumatica (HCC)    Renal insufficiency    Sleep apnea    "lost weight; no more problem" (01/10/2017)   Stroke ([place]) 01/10/06   denies residual on 05/09/2014   Type II diabetes mellitus ([place]) dx'd 1995   Unspecified hereditary and idiopathic peripheral neuropathy    feet   Unspecified osteomyelitis, site unspecified    Unspecified vitamin D deficiency     Family History  Problem Relation Age of Onset   Hypertension Mother    Cancer Mother 65       Ovarian   Heart disease Maternal Aunt    Stroke Maternal Grandfather     Past Surgical History:  Procedure Laterality Date   ABDOMINAL AORTOGRAM N/A 08/25/2016   Procedure: ABDOMINAL AORTOGRAM;  Surgeon: Wellington Hampshire, MD;  Location: [facility];  Service: Cardiovascular;  Laterality: N/A;   AMPUTATION  01/21/2012   Procedure: AMPUTATION RAY;  Surgeon: [name], MD;  Location: [facility];  Service: Orthopedics;  Laterality: Left;  Left Foot 4th Ray Amputation   AMPUTATION Left 05/04/2013   Procedure: AMPUTATION DIGIT;  Surgeon: [name], MD;  Location: [place] [facility];  Service: Orthopedics;  Laterality: Left;  Left Great Toe Amputation at [facility]  AMPUTATION Left 01/14/2017   Procedure: AMPUTATION ABOVE LEFT KNEE;  Surgeon: [name], MD;  Location: [facility];  Service: Orthopedics;  Laterality: Left;   ANTERIOR CERVICAL DECOMP/DISCECTOMY FUSION  02/2011   BACK SURGERY     BASCILIC VEIN TRANSPOSITION Left 10/19/2012   Procedure: BASCILIC VEIN TRANSPOSITION;  Surgeon: Serafina Mitchell, MD;  Location: [facility];  Service: Vascular;  Laterality: Left;   CARDIAC CATHETERIZATION     "before bypass"   CHOLECYSTECTOMY N/A 08/02/2018   Procedure: LAPAROSCOPIC CHOLECYSTECTOMY WITH INTRAOPERATIVE CHOLANGIOGRAM;  Surgeon: Donnie Mesa, MD;  Location: [facility];  Service: General;  Laterality: N/A;   CORONARY ARTERY BYPASS GRAFT     x 5 with lima at [place] Left 08/07/2014   Procedure: Replace Left Total Knee Arthroplasty,  Place Antibiotic Spacer;  Surgeon: [name], MD;  Location: [facility];  Service: Orthopedics;  Laterality: Left;   I&D EXTREMITY Left 05/09/2014   Procedure: Irrigation and Debridement Left Knee and Closure of Total Knee Arthroplasty Incision;  Surgeon: [name], MD;  Location: [facility];  Service: Orthopedics;  Laterality: Left;   I&D KNEE WITH POLY EXCHANGE Left 05/31/2014   Procedure: IRRIGATION AND DEBRIDEMENT LEFT KNEE, PLACE ANTIBIOTIC BEADS,  POLY EXCHANGE;  Surgeon: [name], MD;  Location: [place];  Service: Orthopedics;  Laterality: Left;   IRRIGATION AND DEBRIDEMENT KNEE Left 01/12/2017   Procedure: IRRIGATION AND DEBRIDEMENT LEFT KNEE;  Surgeon: [name], MD;  Location: [facility];  Service: Orthopedics;  Laterality: Left;   JOINT REPLACEMENT     KNEE ARTHROSCOPY Left  08-25-2012   LOWER EXTREMITY ANGIOGRAPHY Left 08/25/2016   Procedure: Lower Extremity Angiography;  Surgeon: Wellington Hampshire, MD;  Location: [facility];  Service: Cardiovascular;  Laterality: Left;   PERIPHERAL VASCULAR BALLOON ANGIOPLASTY Left 08/25/2016   Procedure: PERIPHERAL VASCULAR BALLOON ANGIOPLASTY;  Surgeon: Wellington Hampshire, MD;  Location: [facility];  Service: Cardiovascular;  Laterality: Left;  lt peroneal and ant tibial arteries cutting balloon   REFRACTIVE SURGERY Bilateral    TOE AMPUTATION Bilateral    "I've lost 7 toes over the last 7 years" (05/09/2014)   TOE SURGERY Left April 2015   Big toe removed on left foot.   TONSILLECTOMY     TOTAL KNEE ARTHROPLASTY Left 04/10/2014   Procedure: TOTAL KNEE ARTHROPLASTY;  Surgeon: [name], MD;  Location: [facility];  Service: Orthopedics;  Laterality: Left;   TOTAL KNEE REVISION Left 10/25/2014   Procedure: LEFT TOTAL KNEE REVISION;  Surgeon: [name], MD;  Location: [facility];  Service: Orthopedics;  Laterality: Left;   TOTAL KNEE REVISION Left 11/26/2015   Procedure: Removal Left Total Knee Arthroplasty, Hinged Total Knee Arthroplasty;  Surgeon: [name], MD;  Location: [facility];  Service: Orthopedics;  Laterality: Left;   UVULOPALATOPHARYNGOPLASTY, TONSILLECTOMY AND SEPTOPLASTY  ~ [place] Left 05/09/2014   Dehiscence Left Total Knee Arthroplasty Incision   Social History   Occupational History   Not on file  Tobacco Use   Smoking status: Former Smoker    Packs/day: 0.12    Years: 32.00    Pack years: 3.84    Types: Cigarettes    Quit date: 10/05/2016    Years since quitting: 1.8   Smokeless tobacco: Never Used  Substance and Sexual Activity   Alcohol use: No    Alcohol/week: 0.0 standard drinks   Drug  use: No   Sexual activity: Not Currently

## 2018-09-02 IMAGING — DX DG CHEST 1V
1 series · 1 of 1 positions shown · non-contrast
Comparison: November 08, 2016

CLINICAL DATA: Pain following fall

EXAM:
CHEST 1 VIEW

[chest ap]
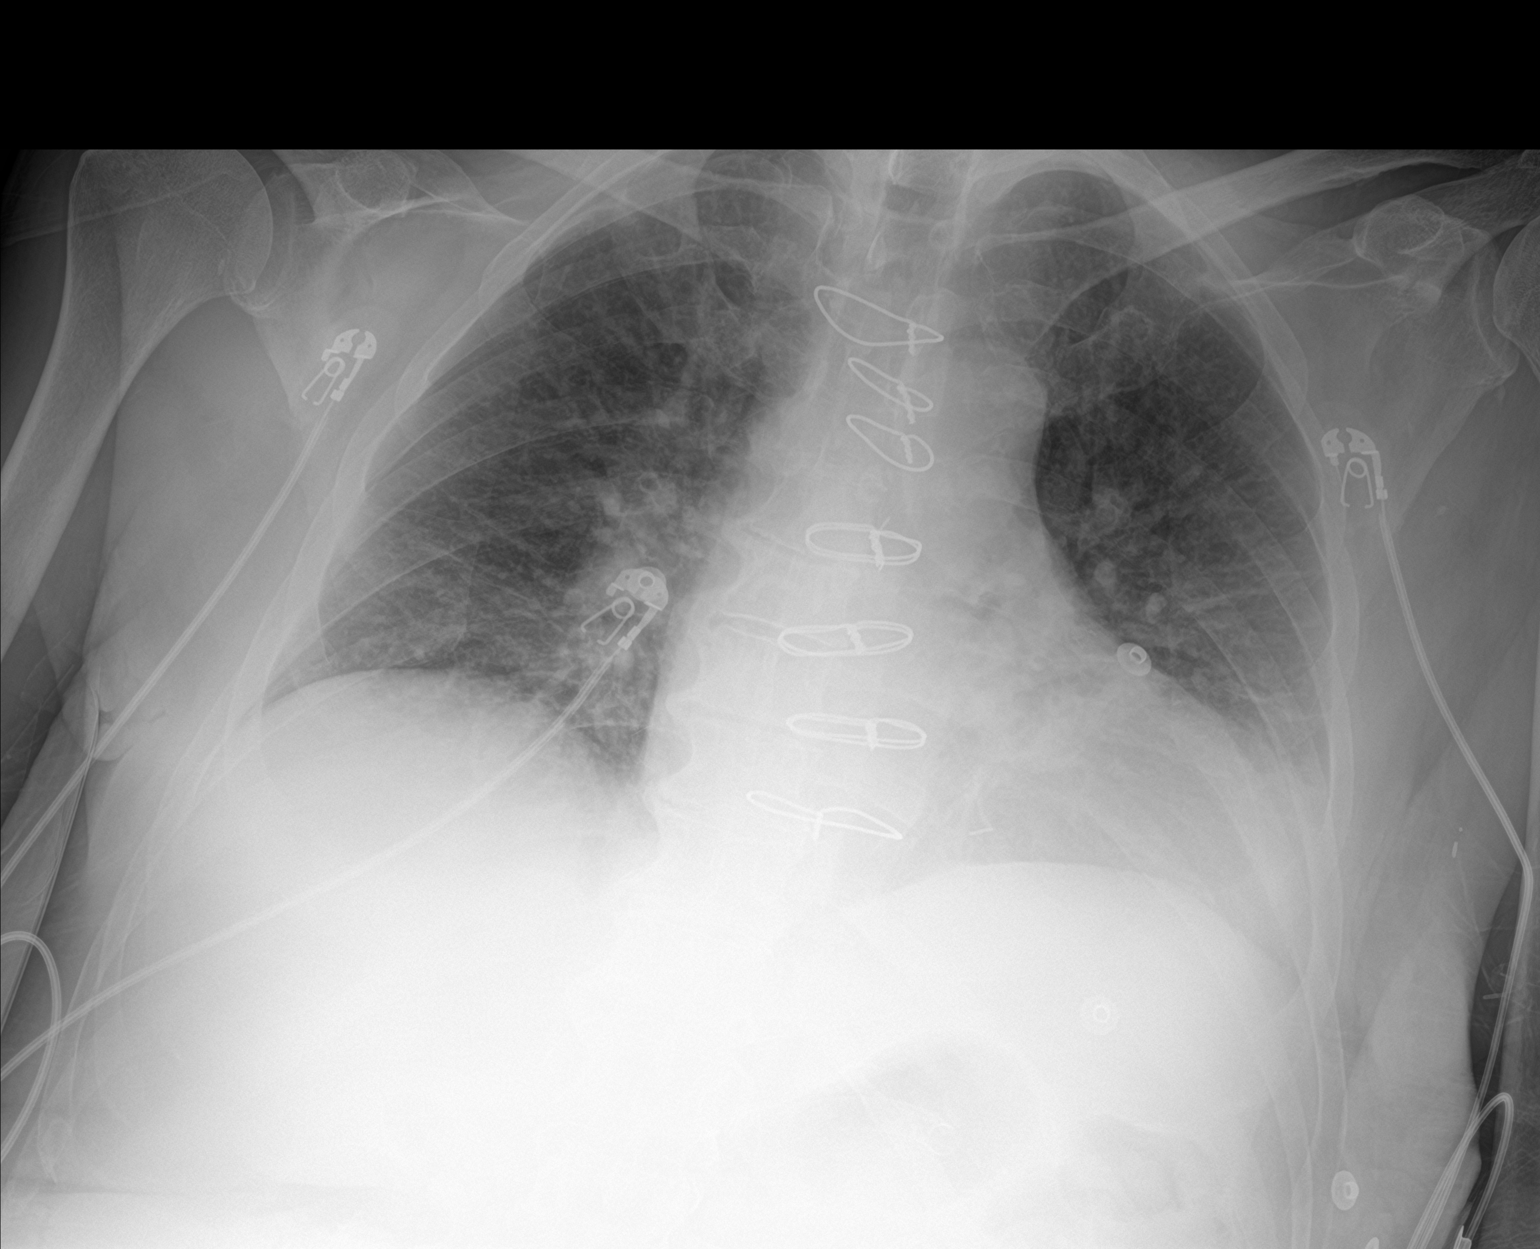

[1 of 1 positions shown; findings below may reference images not displayed]

FINDINGS: There is mild interstitial edema. No airspace consolidation. There
is cardiomegaly with mild pulmonary venous hypertension. Patient is
status post median sternotomy. No adenopathy. There is postoperative
change in the lower cervical spine.
IMPRESSION: Pulmonary vascular congestion with mild interstitial edema. There
may be a degree of congestive heart failure. No consolidation.

## 2018-09-28 ENCOUNTER — Other Ambulatory Visit (HOSPITAL_COMMUNITY): Payer: Medicare HMO

## 2018-10-02 NOTE — Progress Notes (Signed)
[id number] [address], [place] - [address] [place] [address] Phone: [phone] Fax: [phone]      Your procedure is scheduled on Thursday, 10/12/2018.  Report to [facility] Main Entrance "A" at 09:30 A.M., and check in at the Admitting office.  Call this number if you have problems the morning of surgery:  [phone]  Call [phone] if you have any questions prior to your surgery date Monday-Friday 8am-4pm    Remember:  Do not eat or drink after midnight the night before your surgery  You may drink clear liquids until 08:30am the morning of your surgery.   Clear liquids allowed are: Water, Non-Citrus Juices (without pulp), Carbonated Beverages, Clear Tea, Black Coffee Only, and Gatorade    Take these medicines the morning of surgery with A SIP OF WATER: Amlodipine (Norvasc) Carvedilol (Coreg) Hydrocodone-acetaminophen (Norco) - if needed Hydroxyzine (Atarax/vistaril) - if needed   7 days prior to surgery STOP taking any Aspirin (unless otherwise instructed by your surgeon), Aleve, Naproxen, Ibuprofen, Motrin, Advil, Goody's, BC's, all herbal medications, fish oil, and all vitamins.  Follow your surgeon's instructions on when to stop Aspirin.  If no instructions were given by your surgeon then you will need to call the office to get those instructions.      The Morning of Surgery  Do not wear jewelry.  Do not wear lotions, powders, or colognes, or deodorant  Men may shave face and neck.  Do not bring valuables to the hospital.  [facility] is not responsible for any belongings or valuables.  If you are a smoker, DO NOT Smoke 24 hours prior to surgery IF you wear a CPAP at night please bring your mask, tubing, and machine the morning of surgery   Remember that you must have someone to transport you home after your surgery, and remain with you for 24 hours if you are  discharged the same day.   Contacts, eyeglasses, hearing aids, dentures or bridgework may not be worn into surgery.    Leave your suitcase in the car.  After surgery it may be brought to your room.  For patients admitted to the hospital, discharge time will be determined by your treatment team.  Patients discharged the day of surgery will not be allowed to drive home.    Special instructions:   [place]- Preparing For Surgery  Before surgery, you can play an important role. Because skin is not sterile, your skin needs to be as free of germs as possible. You can reduce the number of germs on your skin by washing with CHG (chlorahexidine gluconate) Soap before surgery.  CHG is an antiseptic cleaner which kills germs and bonds with the skin to continue killing germs even after washing.    Oral Hygiene is also important to reduce your risk of infection.  Remember - BRUSH YOUR TEETH THE MORNING OF SURGERY WITH YOUR REGULAR TOOTHPASTE  Please do not use if you have an allergy to CHG or antibacterial soaps. If your skin becomes reddened/irritated stop using the CHG.  Do not shave (including legs and underarms) for at least 48 hours prior to first CHG shower. It is OK to shave your face.  Please follow these instructions carefully.   1. Shower the NIGHT BEFORE SURGERY and the MORNING OF SURGERY with CHG Soap.   2. If you chose to wash your hair, wash your hair first as usual  with your normal shampoo.  3. After you shampoo, rinse your hair and body thoroughly to remove the shampoo.  4. Use CHG as you would any other liquid soap. You can apply CHG directly to the skin and wash gently with a scrungie or a clean washcloth.   5. Apply the CHG Soap to your body ONLY FROM THE NECK DOWN.  Do not use on open wounds or open sores. Avoid contact with your eyes, ears, mouth and genitals (private parts). Wash Face and genitals (private parts)  with your normal soap.   6. Wash thoroughly, paying  special attention to the area where your surgery will be performed.  7. Thoroughly rinse your body with warm water from the neck down.  8. DO NOT shower/wash with your normal soap after using and rinsing off the CHG Soap.  9. Pat yourself dry with a CLEAN TOWEL.  10. Wear CLEAN PAJAMAS to bed the night before surgery, wear comfortable clothes the morning of surgery  11. Place CLEAN SHEETS on your bed the night of your first shower and DO NOT SLEEP WITH PETS.    Day of Surgery:  Please shower the morning of surgery with the CHG soap  Do not apply any deodorants/lotions. Please wear clean clothes to the hospital/surgery center.   Remember to brush your teeth WITH YOUR REGULAR TOOTHPASTE.   Please read over the following fact sheets that you were given.

## 2018-10-03 ENCOUNTER — Encounter (HOSPITAL_COMMUNITY)
Admission: RE | Admit: 2018-10-03 | Discharge: 2018-10-03 | Disposition: A | Discharge status: Home / Self Care | Payer: Medicare HMO | Source: Ambulatory Visit | Attending: Orthopaedic Surgery | Admitting: Orthopaedic Surgery

## 2018-10-03 ENCOUNTER — Other Ambulatory Visit: Payer: Self-pay

## 2018-10-03 ENCOUNTER — Encounter (HOSPITAL_COMMUNITY): Payer: Self-pay

## 2018-10-03 DIAGNOSIS — Z01812 Encounter for preprocedural laboratory examination: Secondary | ICD-10-CM | POA: Diagnosis present

## 2018-10-03 LAB — CBC
HCT: 35.6 % — ABNORMAL LOW (ref 39.0–52.0)
Hemoglobin: 11.2 g/dL — ABNORMAL LOW (ref 13.0–17.0)
MCH: 30.2 pg (ref 26.0–34.0)
MCHC: 31.5 g/dL (ref 30.0–36.0)
MCV: 96 fL (ref 80.0–100.0)
Platelets: 163 10*3/uL (ref 150–400)
RBC: 3.71 MIL/uL — ABNORMAL LOW (ref 4.22–5.81)
RDW: 17 % — ABNORMAL HIGH (ref 11.5–15.5)
WBC: 5.7 10*3/uL (ref 4.0–10.5)
nRBC: 0 % (ref 0.0–0.2)

## 2018-10-03 LAB — BASIC METABOLIC PANEL
Anion gap: 16 — ABNORMAL HIGH (ref 5–15)
BUN: 66 mg/dL — ABNORMAL HIGH (ref 6–20)
CO2: 21 mmol/L — ABNORMAL LOW (ref 22–32)
Calcium: 9.5 mg/dL (ref 8.9–10.3)
Chloride: 98 mmol/L (ref 98–111)
Creatinine, Ser: 8.74 mg/dL — ABNORMAL HIGH (ref 0.61–1.24)
GFR calc Af Amer: 7 mL/min — ABNORMAL LOW (ref >60)
GFR calc non Af Amer: 6 mL/min — ABNORMAL LOW (ref >60)
Glucose, Bld: 146 mg/dL — ABNORMAL HIGH (ref 70–99)
Potassium: 6.6 mmol/L (ref 3.5–5.1)
Sodium: 135 mmol/L (ref 135–145)

## 2018-10-03 LAB — SURGICAL PCR SCREEN
MRSA, PCR: NEGATIVE
Staphylococcus aureus: NEGATIVE

## 2018-10-03 LAB — GLUCOSE, CAPILLARY: Glucose-Capillary: 199 mg/dL — ABNORMAL HIGH (ref 70–99)

## 2018-10-03 NOTE — Progress Notes (Signed)
Notified Dr. Trevor Mace office for patient's Potassium level of 6.6.

## 2018-10-03 NOTE — Progress Notes (Addendum)
PCP - unknown, At [facility] on [address], MD @[place]  [facility]  PPM/ICD - N/A Device Orders - N/A Rep Notified N/A  Chest x-ray - 07/09/2018 EKG - 08/01/2018 Stress Test - 04/29/2016 ECHO - 08/01/2018 Cardiac Cath - 05/11/2016  Sleep Study - Yes, 15 years ago CPAP - NN/A Fasting Blood Sugar - unknown Checks Blood Sugar __0__ times a day  Blood Thinner Instructions: N/A Aspirin Instructions: N/A  ERAS Protcol - Yes, No drink PRE-SURGERY Ensure - N/A  COVID TEST- 10/09/2018   Anesthesia review: Yes, Cardiac hx, recent admission 07/2018  Patient denies shortness of breath, fever, cough and chest pain at PAT appointment   Coronavirus Screening  Have you experienced the following symptoms:  Cough yes/no: No Fever (>100.10F)  yes/no: No Runny nose yes/no: No Sore throat yes/no: No Difficulty breathing/shortness of breath  yes/no: No  Have you or a family member traveled in the last 14 days and where? yes/no: No   If the patient indicates "YES" to the above questions, their PAT will be rescheduled to limit the exposure to others and, the surgeon will be notified. THE PATIENT WILL NEED TO BE ASYMPTOMATIC FOR 14 DAYS.   If the patient is not experiencing any of these symptoms, the PAT nurse will instruct them to NOT bring anyone with them to their appointment since they may have these symptoms or traveled as well.   Please remind your patients and families that hospital visitation restrictions are in effect and the importance of the restrictions.     Patient verbalized understanding of instructions that were given to them at the PAT appointment. Patient was also instructed that they will need to review over the PAT instructions again at home before surgery.

## 2018-10-04 ENCOUNTER — Encounter (HOSPITAL_COMMUNITY): Payer: Self-pay | Admitting: Vascular Surgery

## 2018-10-04 LAB — HEMOGLOBIN A1C
Hgb A1c MFr Bld: 5.7 % — ABNORMAL HIGH (ref 4.8–5.6)
Mean Plasma Glucose: 117 mg/dL

## 2018-10-05 NOTE — Anesthesia Preprocedure Evaluation (Deleted)
Anesthesia Evaluation    Airway        Dental   Pulmonary Current Smoker,           Cardiovascular hypertension,      Neuro/Psych    GI/Hepatic   Endo/Other  diabetes  Renal/GU      Musculoskeletal   Abdominal   Peds  Hematology   Anesthesia Other Findings   Reproductive/Obstetrics                             Anesthesia Physical Anesthesia Plan  ASA:   Anesthesia Plan:    Post-op Pain Management:    Induction:   PONV Risk Score and Plan:   Airway Management Planned:   Additional Equipment:   Intra-op Plan:   Post-operative Plan:   Informed Consent:   Plan Discussed with:   Anesthesia Plan Comments: (ESRD on HD M,W,F. Pt was seen at PAT 10/03/18 and K+ was 6.6. Result was called to Dr. Trevor Mace office. Pt did complete his scheduled dialysis session the following day 9/23. Labs will be rechecked on DOS per anesthesia protocol for dialysis patients.   Follows with cardiology for hx of CAD (s/p CABG x 3 2018), PAD (s/p L BKA). Recently admitted July 2020 for acute cholecystitis. While admitted he was seen by Dr. Dorris Carnes 08/01/18 for preop clearance for cholecystectomy. Per her note, "Most recent Echo in 01/2017 showed LVEF of 50-55% with no regional wall motion abnormalities. Pt with revascularlization 2 years ago   No symtpoms to sugg active angina.   He is in SR    He is undergoing HD No SOB or CHF on exam. From a cardiac standpoint he is a low to mod risk for major cardiac event with plan surgery.   Would follow hemodynamics closely in periop period  Watch BP I think he is acceptable to proceed without further heart testing."  After being cleared, pt had a repeat Echo 10/02/18 showing: The left ventricle has severely reduced systolic function, with an ejection fraction of 25-30%. The cavity size was normal. There is no increase in left ventricular wall thickness. Left ventricular  diastolic function could not be evaluated due to nondiagnostic images. Left ventricular diffuse hypokinesis. Spontaneous echo contrast noted in LV suggestive of low flow.  I do not see that this echo was addressed during his hospitalization. I called Dr. Trevor Mace office and advised the pt will need preop cardiac clearance. Pt was seen by cardiology on 10/11/18 and he will need additional eval for low EF. )       Anesthesia Quick Evaluation

## 2018-10-09 ENCOUNTER — Emergency Department (HOSPITAL_COMMUNITY): Payer: Medicare HMO

## 2018-10-09 ENCOUNTER — Inpatient Hospital Stay (HOSPITAL_COMMUNITY): Admission: RE | Admit: 2018-10-09 | Payer: Medicare HMO | Source: Ambulatory Visit

## 2018-10-09 ENCOUNTER — Emergency Department (HOSPITAL_COMMUNITY)
Admission: EM | Admit: 2018-10-09 | Discharge: 2018-10-09 | Disposition: A | Discharge status: Home / Self Care | Payer: Medicare HMO | Attending: Emergency Medicine | Admitting: Emergency Medicine

## 2018-10-09 DIAGNOSIS — E1122 Type 2 diabetes mellitus with diabetic chronic kidney disease: Secondary | ICD-10-CM | POA: Insufficient documentation

## 2018-10-09 DIAGNOSIS — Z85528 Personal history of other malignant neoplasm of kidney: Secondary | ICD-10-CM | POA: Insufficient documentation

## 2018-10-09 DIAGNOSIS — Z992 Dependence on renal dialysis: Secondary | ICD-10-CM | POA: Insufficient documentation

## 2018-10-09 DIAGNOSIS — Z20828 Contact with and (suspected) exposure to other viral communicable diseases: Secondary | ICD-10-CM | POA: Diagnosis not present

## 2018-10-09 DIAGNOSIS — E1140 Type 2 diabetes mellitus with diabetic neuropathy, unspecified: Secondary | ICD-10-CM | POA: Insufficient documentation

## 2018-10-09 DIAGNOSIS — Z79899 Other long term (current) drug therapy: Secondary | ICD-10-CM | POA: Insufficient documentation

## 2018-10-09 DIAGNOSIS — R1032 Left lower quadrant pain: Secondary | ICD-10-CM | POA: Insufficient documentation

## 2018-10-09 DIAGNOSIS — Z96652 Presence of left artificial knee joint: Secondary | ICD-10-CM | POA: Diagnosis not present

## 2018-10-09 DIAGNOSIS — I132 Hypertensive heart and chronic kidney disease with heart failure and with stage 5 chronic kidney disease, or end stage renal disease: Secondary | ICD-10-CM | POA: Insufficient documentation

## 2018-10-09 DIAGNOSIS — Z7982 Long term (current) use of aspirin: Secondary | ICD-10-CM | POA: Insufficient documentation

## 2018-10-09 DIAGNOSIS — F17210 Nicotine dependence, cigarettes, uncomplicated: Secondary | ICD-10-CM | POA: Insufficient documentation

## 2018-10-09 DIAGNOSIS — I5032 Chronic diastolic (congestive) heart failure: Secondary | ICD-10-CM | POA: Insufficient documentation

## 2018-10-09 DIAGNOSIS — N186 End stage renal disease: Secondary | ICD-10-CM | POA: Insufficient documentation

## 2018-10-09 LAB — CBC
HCT: 36.2 % — ABNORMAL LOW (ref 39.0–52.0)
Hemoglobin: 11.4 g/dL — ABNORMAL LOW (ref 13.0–17.0)
MCH: 30.1 pg (ref 26.0–34.0)
MCHC: 31.5 g/dL (ref 30.0–36.0)
MCV: 95.5 fL (ref 80.0–100.0)
Platelets: 153 10*3/uL (ref 150–400)
RBC: 3.79 MIL/uL — ABNORMAL LOW (ref 4.22–5.81)
RDW: 16.4 % — ABNORMAL HIGH (ref 11.5–15.5)
WBC: 5.7 10*3/uL (ref 4.0–10.5)
nRBC: 0 % (ref 0.0–0.2)

## 2018-10-09 LAB — COMPREHENSIVE METABOLIC PANEL
ALT: 11 U/L (ref 0–44)
AST: 10 U/L — ABNORMAL LOW (ref 15–41)
Albumin: 3.6 g/dL (ref 3.5–5.0)
Alkaline Phosphatase: 109 U/L (ref 38–126)
Anion gap: 18 — ABNORMAL HIGH (ref 5–15)
BUN: 48 mg/dL — ABNORMAL HIGH (ref 6–20)
CO2: 24 mmol/L (ref 22–32)
Calcium: 9.7 mg/dL (ref 8.9–10.3)
Chloride: 95 mmol/L — ABNORMAL LOW (ref 98–111)
Creatinine, Ser: 7.22 mg/dL — ABNORMAL HIGH (ref 0.61–1.24)
GFR calc Af Amer: 9 mL/min — ABNORMAL LOW (ref >60)
GFR calc non Af Amer: 8 mL/min — ABNORMAL LOW (ref >60)
Glucose, Bld: 130 mg/dL — ABNORMAL HIGH (ref 70–99)
Potassium: 5.2 mmol/L — ABNORMAL HIGH (ref 3.5–5.1)
Sodium: 137 mmol/L (ref 135–145)
Total Bilirubin: 0.4 mg/dL (ref 0.3–1.2)
Total Protein: 7.4 g/dL (ref 6.5–8.1)

## 2018-10-09 LAB — LIPASE, BLOOD: Lipase: 75 U/L — ABNORMAL HIGH (ref 11–51)

## 2018-10-09 MED ORDER — SODIUM CHLORIDE 0.9% FLUSH: 3.0000 mL | Freq: Once | INTRAVENOUS | Status: DC

## 2018-10-09 MED ORDER — FENTANYL CITRATE (PF) 100 MCG/2ML IJ SOLN
50.0000 ug | Freq: Once | INTRAMUSCULAR | Status: AC
  Administered 2018-10-09: 50 ug via INTRAVENOUS
  Filled 2018-10-09: qty 2

## 2018-10-09 MED ORDER — METHOCARBAMOL 500 MG PO TABS: 500.0000 mg | ORAL_TABLET | Freq: Three times a day (TID) | ORAL | 0 refills | Status: DC | PRN

## 2018-10-09 NOTE — ED Notes (Signed)
Patient verbalizes understanding of discharge instructions. Opportunity for questioning and answers were provided. Armband removed by staff, pt discharged from ED ambulatory by self\  

## 2018-10-09 NOTE — ED Notes (Signed)
Pt states pain is 9 on scale 0-10. RN notified.

## 2018-10-09 NOTE — ED Triage Notes (Signed)
Pt arrives via EMS from dialysis center, was scheduled for dialysis today, normally MWF, has been having worsening lower left abdominal pain since Thursday. Pain radiates to his back. Increased with movement. Pt has multiple hernias. Has surgery scheduled for Thursday. PT reports some nausea. Denies recent fevers. Vitals: 137/90, HR 77, 97% RA. 18RR. CBG 165. Alert, oriented x4.

## 2018-10-09 NOTE — ED Provider Notes (Signed)
[facility] EMERGENCY DEPARTMENT Provider Note   CSN: [id number] Arrival date & time: 10/09/18  0813     History   Chief Complaint Chief Complaint  Patient presents with  . Abdominal Pain    HPI Matthew Kline is a 58 y.o. male.     HPI Patient presents with left lower abdominal pain.  Has had for around the last 4 days.  Worse with movements.  States it is been worse with standing but now hurts even when he is not standing.  Has chronic diarrhea.  States he has had this pain over 4 days but has had pain like before.  States that there was thought that it could have been a Crohn's disease.  States that he was not on medicines because he could not afford them also has bilateral inguinal hernias.  He is due for repair of the hernias on Thursday.  No fevers.  Has had a previous left-sided above-the-knee amputation.  Is a dialysis patient and was scheduled for dialysis but was not able to go today. Past Medical History:  Diagnosis Date  . Anemia, unspecified   . Anxiety   . Arthralgia 2010   polyarticular  . Arthritis    "back, knees" (01/10/2017)  . [facility])    "kidney area" (01/10/2017)  . CHF (congestive heart failure) ([place]) 07/25/2009   denies  . Chronic lower back pain   . Coronary artery disease   . Coughing    pt. reports that he has drainage from sinus infection  . Diabetic foot ulcer ([place])   . Diabetic neuropathy ([place])   . ESRD (end stage renal disease) on dialysis [facility])    started 12/2012; "MWF; [facility] "  (01/10/2017)  . GERD (gastroesophageal reflux disease)    hx "before I lost weight", no problem 9 years  . Hemodialysis access site with mature fistula ([place])   . Hemorrhoids, internal 10/2011   small  . High cholesterol   . History of blood transfusion    "related to the anemia"  . Hypertension   . Insomnia, unspecified   . Lacunar infarction ([place]) 2006   RUE/RLE, speech  . Long term (current) use of anticoagulants   .  Myocardial infarction ([place]) 1995  . Orthostatic hypotension   . Osteomyelitis of foot, left, acute ([place])   . Other chronic postoperative pain   . Pneumonia    "probably twice" (01/10/2017)  . Polymyalgia rheumatica ([place])   . Renal insufficiency   . Sleep apnea    "lost weight; no more problem" (01/10/2017)  . Stroke ([place]) 01/10/06   denies residual on 05/09/2014  . Type II diabetes mellitus ([place]) dx'd 1995  . Unspecified hereditary and idiopathic peripheral neuropathy    feet  . Unspecified osteomyelitis, site unspecified   . Unspecified vitamin D deficiency     Patient Active Problem List   Diagnosis Date Noted  . Biliary colic 10/27/5100  . Acute cholecystitis 07/31/2018  . Prolonged QT interval 07/31/2018  . Acute respiratory failure with hypoxia ([place]) 07/10/2018  . Abdominal pain 07/10/2018  . Bilateral recurrent inguinal hernia without obstruction or gangrene   . Altered mental status   . Cerebral thrombosis with cerebral infarction 02/06/2017  . Cerebral embolism with cerebral infarction 02/06/2017  . Hx of AKA (above knee amputation), left ([place])   . Pressure injury of skin 01/30/2017  . Acute lower UTI 01/29/2017  . Acute metabolic encephalopathy 58/52/7782  . UTI (urinary tract infection) 01/29/2017  .  Quadriceps muscle rupture, left, initial encounter   . Fall 01/09/2017  . Gait disturbance 01/09/2017  . Staphylococcus aureus infection 01/09/2017  . Infection of prosthetic left knee joint ([place]) 01/09/2017  . Fever, unknown origin 11/09/2016  . Critical lower limb ischemia 08/25/2016  . Ulcer of left midfoot with fat layer exposed ([place]) 08/13/2016  . Diabetic ulcer of left midfoot associated with type 2 diabetes mellitus, with fat layer exposed ([place]) 07/25/2016  . Peripheral neuropathy 07/22/2016  . Tobacco abuse 07/22/2016  . CAD in native artery 05/18/2016  . CAD, multiple vessel 05/11/2016  . Positive cardiac stress test 05/11/2016  . Abnormal stress test  04/30/2016  . Pre-transplant evaluation for kidney transplant 04/30/2016  . S/P revision of total knee 11/26/2015  . Pain in the chest   . Acute on chronic diastolic heart failure ([place]) 07/05/2015  . Volume overload 07/04/2015  . Shortness of breath 07/04/2015  . Hypoxemia 07/04/2015  . Elevated troponin   . End-stage renal disease on hemodialysis ([place])   . Hypervolemia   . 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 Pyogenic bacterial arthritis of knee, left ([place]) 08/07/2014  . Tachycardia 07/24/2014  . Acute upper respiratory infection 07/24/2014  . ESRD on dialysis ([place]) 07/14/2014  . Type II diabetes mellitus ([place]) 07/14/2014  . Anemia in chronic kidney disease 07/14/2014  . Congestive heart failure (CHF) ([place]) 07/13/2014  . Surgical wound dehiscence 05/09/2014  . Dehiscence of closure of skin 05/09/2014  . Total knee replacement status 04/10/2014  . Diabetes mellitus with renal manifestations, controlled ([place]) 10/24/2013  . Hypertensive renal disease 06/27/2013  . DM type 2 causing vascular disease ([place]) 06/27/2013  . Erectile dysfunction 06/27/2013  . Depression 06/27/2013  . Claudication of left lower extremity ([place]) 12/19/2012  . Essential hypertension, benign 12/19/2012  . Sinusitis, acute maxillary 11/22/2012  . Otitis, externa, infective 11/14/2012  . Leg edema, left 11/14/2012  . End stage renal disease ([place]) 10/02/2012  . Controlled type 2 DM with proteinuria or microalbuminuria 09/19/2012  . GERD (gastroesophageal reflux disease) 09/19/2012  . Leukocytosis 09/19/2012  . Lacunar infarction ([place]) 08/17/2012  . Polymyalgia rheumatica ([place]) 08/17/2012  . 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 Essential hypertension 05/10/2012  . Vitamin D deficiency 05/10/2012  . Diabetes mellitus due to underlying condition ([place]) 05/10/2012  . Hyperlipidemia LDL goal <100 05/10/2012  . Anemia of chronic disease 05/10/2012  . Screening for prostate cancer 05/10/2012  .  Chronic kidney disease (CKD), stage IV (severe) ([place]) 05/10/2012  . Peripheral autonomic neuropathy due to DM ([place]) 05/10/2012  . Callus of foot 05/10/2012  . Urgency of urination 05/10/2012  . Hyperkalemia 05/10/2012  . Candidiasis of the esophagus 10/12/2011  . Internal hemorrhoids without mention of complication 02/40/9735  . Pre-syncope 07/25/2009  . DJD (degenerative joint disease) of cervical spine 02/17/2009    Past Surgical History:  Procedure Laterality Date  . ABDOMINAL AORTOGRAM N/A 08/25/2016   Procedure: ABDOMINAL AORTOGRAM;  Surgeon: Wellington Hampshire, MD;  Location: [facility] CV [facility];  Service: Cardiovascular;  Laterality: N/A;  . AMPUTATION  01/21/2012   Procedure: AMPUTATION RAY;  Surgeon: [name], MD;  Location: [facility];  Service: Orthopedics;  Laterality: Left;  Left Foot 4th Ray Amputation  . AMPUTATION Left 05/04/2013   Procedure: AMPUTATION DIGIT;  Surgeon: [name], MD;  Location: [facility];  Service: Orthopedics;  Laterality: Left;  Left Great Toe Amputation at [facility]  . AMPUTATION Left 01/14/2017   Procedure: AMPUTATION ABOVE  LEFT KNEE;  Surgeon: [name], MD;  Location: [facility];  Service: Orthopedics;  Laterality: Left;  . ANTERIOR CERVICAL DECOMP/DISCECTOMY FUSION  02/2011  . BACK SURGERY    . BASCILIC VEIN TRANSPOSITION Left 10/19/2012   Procedure: BASCILIC VEIN TRANSPOSITION;  Surgeon: Serafina Mitchell, MD;  Location: [facility];  Service: Vascular;  Laterality: Left;  . CARDIAC CATHETERIZATION     "before bypass"  . CHOLECYSTECTOMY N/A 08/02/2018   Procedure: LAPAROSCOPIC CHOLECYSTECTOMY WITH INTRAOPERATIVE CHOLANGIOGRAM;  Surgeon: Donnie Mesa, MD;  Location: [facility];  Service: General;  Laterality: N/A;  . CORONARY ARTERY BYPASS GRAFT     x 5 with lima at [place] SPACERS Left 08/07/2014   Procedure: Replace Left Total Knee Arthroplasty,  Place Antibiotic Spacer;  Surgeon: [name], MD;  Location:  [facility];  Service: Orthopedics;  Laterality: Left;  . I&D EXTREMITY Left 05/09/2014   Procedure: Irrigation and Debridement Left Knee and Closure of Total Knee Arthroplasty Incision;  Surgeon: [name], MD;  Location: [facility];  Service: Orthopedics;  Laterality: Left;  . I&D KNEE WITH POLY EXCHANGE Left 05/31/2014   Procedure: IRRIGATION AND DEBRIDEMENT LEFT KNEE, PLACE ANTIBIOTIC BEADS,  POLY EXCHANGE;  Surgeon: [name], MD;  Location: [facility];  Service: Orthopedics;  Laterality: Left;  . IRRIGATION AND DEBRIDEMENT KNEE Left 01/12/2017   Procedure: IRRIGATION AND DEBRIDEMENT LEFT KNEE;  Surgeon: [name], MD;  Location: [facility];  Service: Orthopedics;  Laterality: Left;  . JOINT REPLACEMENT    . KNEE ARTHROSCOPY Left 08-25-2012  . LOWER EXTREMITY ANGIOGRAPHY Left 08/25/2016   Procedure: Lower Extremity Angiography;  Surgeon: Wellington Hampshire, MD;  Location: [facility];  Service: Cardiovascular;  Laterality: Left;  . PERIPHERAL VASCULAR BALLOON ANGIOPLASTY Left 08/25/2016   Procedure: PERIPHERAL VASCULAR BALLOON ANGIOPLASTY;  Surgeon: Wellington Hampshire, MD;  Location: [facility];  Service: Cardiovascular;  Laterality: Left;  lt peroneal and ant tibial arteries cutting balloon  . REFRACTIVE SURGERY Bilateral   . TOE AMPUTATION Bilateral    "I've lost 7 toes over the last 7 years" (05/09/2014)  . TOE SURGERY Left April 2015   Big toe removed on left foot.  . TONSILLECTOMY    . TOTAL KNEE ARTHROPLASTY Left 04/10/2014   Procedure: TOTAL KNEE ARTHROPLASTY;  Surgeon: [name], MD;  Location: [facility];  Service: Orthopedics;  Laterality: Left;  . TOTAL KNEE REVISION Left 10/25/2014   Procedure: LEFT TOTAL KNEE REVISION;  Surgeon: [name], MD;  Location: [facility];  Service: Orthopedics;  Laterality: Left;  . TOTAL KNEE REVISION Left 11/26/2015   Procedure: Removal Left Total Knee Arthroplasty, Hinged Total Knee Arthroplasty;  Surgeon: [name], MD;  Location: [facility];   Service: Orthopedics;  Laterality: Left;  . UVULOPALATOPHARYNGOPLASTY, TONSILLECTOMY AND SEPTOPLASTY  ~ 1989  . WOUND DEBRIDEMENT Left 05/09/2014   Dehiscence Left Total Knee Arthroplasty Incision        Home Medications    Prior to Admission medications   Medication Sig Start Date End Date Taking? Authorizing Provider  amLODipine (NORVASC) 5 MG tablet Take 5 mg by mouth daily.     [provider]  aspirin EC 81 MG EC tablet Take 2 tablets (162 mg total) by mouth daily. Patient taking differently: Take 81 mg by mouth daily.  01/20/17   Doreatha Lew, MD  carvedilol (COREG) 12.5 MG tablet Take 1 tablet (12.5  mg total) by mouth 2 (two) times daily with a meal. 02/08/17   Bonnell Public, MD  Darbepoetin Alfa (ARANESP) 150 MCG/0.3ML SOSY injection Inject 0.3 mLs (150 mcg total) into the vein every Friday with hemodialysis. 02/11/17   Bonnell Public, MD  dicyclomine (BENTYL) 20 MG tablet Take 1 tablet (20 mg total) by mouth every 6 (six) hours as needed for spasms (abdominal pain). Patient not taking: Reported on 10/03/2018 07/26/18   Virgel Manifold, MD  gabapentin (NEURONTIN) 100 MG capsule Take 200 mg by mouth every evening. 09/27/18   [provider]  HYDROcodone-acetaminophen (NORCO/VICODIN) 5-325 MG tablet Take 1 tablet by mouth every 6 (six) hours as needed for moderate pain. Patient not taking: Reported on 10/03/2018 08/04/18 08/04/19  Alma Friendly, MD  hydrOXYzine (ATARAX/VISTARIL) 50 MG tablet Take 50 mg by mouth 3 (three) times daily as needed for itching.     [provider]  methocarbamol (ROBAXIN) 500 MG tablet Take 1 tablet (500 mg total) by mouth every 8 (eight) hours as needed for muscle spasms. 10/09/18   Davonna Belling, MD  multivitamin (RENA-VIT) TABS tablet Take 1 tablet by mouth at bedtime. 02/08/17   Dana Allan I, MD  sevelamer carbonate (RENVELA) 800 MG tablet Take 24,000-32,000 mg by mouth daily.     [provider]   temazepam (RESTORIL) 30 MG capsule Take 30 mg by mouth at bedtime.     [provider]    Family History Family History  Problem Relation Age of Onset  . Hypertension Mother   . Cancer Mother 15       Ovarian  . Heart disease Maternal Aunt   . Stroke Maternal Grandfather     Social History Social History   Tobacco Use  . Smoking status: Current Some Day Smoker    Packs/day: 0.12    Years: 32.00    Pack years: 3.84    Types: Cigarettes    Last attempt to quit: 10/05/2016    Years since quitting: 2.0  . Smokeless tobacco: Never Used  . Tobacco comment: 1 per week  Substance Use Topics  . Alcohol use: No    Alcohol/week: 0.0 standard drinks  . Drug use: No     Allergies   Morphine and related, Tygacil [tigecycline], and Imodium [loperamide]   Review of Systems Review of Systems  Constitutional: Positive for unexpected weight change. Negative for appetite change.       Patient has lost 35 pounds in the last 10 months.  States that it is because he cannot eat.  Respiratory: Negative for shortness of breath.   Cardiovascular: Negative for chest pain.  Gastrointestinal: Positive for abdominal pain and diarrhea.  Genitourinary: Negative for flank pain.  Musculoskeletal: Negative for back pain.  Skin: Negative for rash.  Neurological: Negative for weakness.  Psychiatric/Behavioral: Negative for confusion.     Physical Exam Updated Vital Signs BP 137/86   Pulse 76   Temp (!) 97.5 F (36.4 C) (Oral)   Resp 13   Ht 5\' 11"  (1.803 m)   Wt 64.5 kg   SpO2 94%   BMI 19.83 kg/m   Physical Exam Vitals signs and nursing note reviewed.  Eyes:     Pupils: Pupils are equal, round, and reactive to light.  Cardiovascular:     Rate and Rhythm: Normal rate and regular rhythm.  Pulmonary:     Breath sounds: No wheezing or rhonchi.  Abdominal:     Hernia: A hernia is present.  Comments: Tenderness left lower quadrant.  Not worse with moving the leg.  No  mass.  Right-sided inguinal hernia, which is large.  Does reduce the least partially.  No rash.  No distention of abdomen.  Skin:    General: Skin is warm.     Capillary Refill: Capillary refill takes less than 2 seconds.  Neurological:     Mental Status: He is alert and oriented to person, place, and time.      ED Treatments / Results  Labs (all labs ordered are listed, but only abnormal results are displayed) Labs Reviewed  LIPASE, BLOOD - Abnormal; Notable for the following components:      Result Value   Lipase 75 (*)    All other components within normal limits  COMPREHENSIVE METABOLIC PANEL - Abnormal; Notable for the following components:   Potassium 5.2 (*)    Chloride 95 (*)    Glucose, Bld 130 (*)    BUN 48 (*)    Creatinine, Ser 7.22 (*)    AST 10 (*)    GFR calc non Af Amer 8 (*)    GFR calc Af Amer 9 (*)    Anion gap 18 (*)    All other components within normal limits  CBC - Abnormal; Notable for the following components:   RBC 3.79 (*)    Hemoglobin 11.4 (*)    HCT 36.2 (*)    RDW 16.4 (*)    All other components within normal limits  NOVEL CORONAVIRUS, NAA (HOSP ORDER, SEND-OUT TO REF LAB; TAT 18-24 HRS)  URINALYSIS, ROUTINE W REFLEX MICROSCOPIC    EKG None  Radiology Ct Abdomen Pelvis Wo Contrast  Result Date: 10/09/2018 CLINICAL DATA:  Left lower quadrant pain today in a patient with known hernias. EXAM: CT ABDOMEN AND PELVIS WITHOUT CONTRAST TECHNIQUE: Multidetector CT imaging of the abdomen and pelvis was performed following the standard protocol without IV contrast. COMPARISON:  CT abdomen and pelvis 07/09/2018. FINDINGS: Lower chest: No pleural or pericardial effusion. There is cardiomegaly. Extensive calcific coronary artery disease is noted. Hepatobiliary: No focal liver abnormality is seen. Status post cholecystectomy. No biliary dilatation. Pancreas: Unremarkable. No pancreatic ductal dilatation or surrounding inflammatory changes. Spleen: Normal  in size without focal abnormality. Adrenals/Urinary Tract: The adrenal glands are unremarkable. Marked bilateral renal atrophy again seen. Ureters and urinary bladder appear normal. Stomach/Bowel: Stomach is within normal limits. Appendix appears normal. No evidence of bowel wall thickening, distention, or inflammatory changes. Loops of bowel in a right inguinal hernia without obstruction or other complicating feature again seen. Very small fat containing left inguinal hernia is unchanged. Vascular/Lymphatic: Extensive atherosclerotic vascular disease. No lymphadenopathy. Reproductive: Prostate is unremarkable. Other: Small volume of abdominal and pelvic ascites is decreased compared to the prior CT. Musculoskeletal: No fracture or focal lesion. Lower lumbar degenerative change noted IMPRESSION: No acute abnormality abdomen or pelvis. Right inguinal hernia contains loops of small bowel without obstruction or other complicating feature. Small fat containing left inguinal hernia noted. Small volume of abdominal and pelvic ascites is decreased since the prior examination and likely due to volume overload. Marked cardiomegaly. Extensive aortic and coronary atherosclerosis. Electronically Signed   By: Inge Rise M.D.   On: 10/09/2018 10:25    Procedures Procedures (including critical care time)  Medications Ordered in ED Medications  sodium chloride flush (NS) 0.9 % injection 3 mL (3 mLs Intravenous Not Given 10/09/18 0922)  fentaNYL (SUBLIMAZE) injection 50 mcg (50 mcg Intravenous Given 10/09/18 1337)  Initial Impression / Assessment and Plan / ED Course  I have reviewed the triage vital signs and the nursing notes.  Pertinent labs & imaging results that were available during my care of the patient were reviewed by me and considered in my medical decision making (see chart for details).        Patient with left lower abdominal pain.  Acute on chronic.  Work-up reassuring.  Lipase mildly  elevated but doubt significant since it also over lower abdominal pain.  Will treat with muscle relaxers.  Has outpatient follow-up.  Will discharge home.  Shingles felt less likely due to the chronic nature of the pain.  Final Clinical Impressions(s) / ED Diagnoses   Final diagnoses:  Left lower quadrant abdominal pain    ED Discharge Orders         Ordered    methocarbamol (ROBAXIN) 500 MG tablet  Every 8 hours PRN     10/09/18 1418           Davonna Belling, MD 10/09/18 1433

## 2018-10-10 LAB — NOVEL CORONAVIRUS, NAA (HOSP ORDER, SEND-OUT TO REF LAB; TAT 18-24 HRS): SARS-CoV-2, NAA: NOT DETECTED

## 2018-10-11 ENCOUNTER — Encounter: Payer: Self-pay | Admitting: Physician Assistant

## 2018-10-11 ENCOUNTER — Other Ambulatory Visit: Payer: Self-pay

## 2018-10-11 ENCOUNTER — Ambulatory Visit (INDEPENDENT_AMBULATORY_CARE_PROVIDER_SITE_OTHER): Payer: Medicare HMO | Admitting: Physician Assistant

## 2018-10-11 ENCOUNTER — Telehealth: Payer: Self-pay | Admitting: Physician Assistant

## 2018-10-11 VITALS — BP 154/90 | HR 84 | Temp 97.5°F | Ht 71.0 in | Wt 143.0 lb

## 2018-10-11 DIAGNOSIS — Z992 Dependence on renal dialysis: Secondary | ICD-10-CM

## 2018-10-11 DIAGNOSIS — I519 Heart disease, unspecified: Secondary | ICD-10-CM

## 2018-10-11 DIAGNOSIS — Z8673 Personal history of transient ischemic attack (TIA), and cerebral infarction without residual deficits: Secondary | ICD-10-CM

## 2018-10-11 DIAGNOSIS — Z01818 Encounter for other preprocedural examination: Secondary | ICD-10-CM | POA: Diagnosis not present

## 2018-10-11 DIAGNOSIS — I739 Peripheral vascular disease, unspecified: Secondary | ICD-10-CM

## 2018-10-11 DIAGNOSIS — I25810 Atherosclerosis of coronary artery bypass graft(s) without angina pectoris: Secondary | ICD-10-CM

## 2018-10-11 DIAGNOSIS — I10 Essential (primary) hypertension: Secondary | ICD-10-CM

## 2018-10-11 DIAGNOSIS — N186 End stage renal disease: Secondary | ICD-10-CM

## 2018-10-11 DIAGNOSIS — E119 Type 2 diabetes mellitus without complications: Secondary | ICD-10-CM

## 2018-10-11 DIAGNOSIS — Z89612 Acquired absence of left leg above knee: Secondary | ICD-10-CM

## 2018-10-11 NOTE — Progress Notes (Signed)
Cardiology Office Note    Date:  10/12/2018   ID:  Matthew Kline, DOB [birth year], MRN [id number]  PCP:  [facility]  Cardiologist: [facility], Dr. Denzil Magnuson of [facility] and Dr. Fletcher Anon (Patient wish to establish with [facility])  Chief Complaint  Patient presents with  . Pre-op Exam    pending hernia repair by Dr. Coralie Keens.    History of Present Illness:  Matthew Kline is a 58 y.o. male with past medical history of CAD s/p CABG in May 2018, DM 2, ESRD on HD, HTN, CVA 2005, tobacco abuse, PAD and peripheral neuropathy.  He also had multiple digit amputation in the past.  After his bypass surgery, he followed up with Dr. Mauri Brooklyn of [facility] system on 06/30/2016.  According to the office note at the time, his hospitalization course was relatively uncomplicated and he was doing well at that time.  He was evaluated by Dr. Fletcher Anon in August 2018 for nonhealing wound.  He had angiography on August 25, 2016 which showed no significant aortoiliac disease, mild to diffuse left SFA and popliteal disease with severe bifurcation stenosis at the origin of anterior tibial artery and the TP trunk.  He underwent successful bifurcation angioplasty for the bifurcation stenosis.  Postprocedure duplex showed moderately elevated velocities in the popliteal artery and the TP trunk.  His lower extremity wound subsequently improved.  Since the middle of 2018, he was essentially lost for follow-up since from both his primary cardiologist and Dr. Tyrell Antonio office  Since previous visit, he underwent left above-knee amputation by Dr. Meridee Score on 01/14/2017.  Echocardiogram obtained around the same time showed EF 50 to 55% without regional wall motion abnormality.  He was admitted for abdominal discomfort in July 2020.  CT showed nonobstructive inguinal hernia, moderate ascites and a gallstone.  HIDA scan revealed patent cystic duct and mildly low gallbladder EF.  Ultrasound image  consistent with cholecystitis.  At that time, patient mentioned he already had cardiac clearance from primary cardiologist at [facility].  He was seen by Dr. Harrington Challenger on 08/01/2018 who felt he would be a low to moderate risk patient for major cardiac event with the planned surgery.  No additional cardiac work-up was recommended.  He subsequently underwent laparoscopic cholecystectomy by Dr. Georgette Dover on 08/02/2018.  More recently, patient presented to the emergency room again on 10/01/2018 for abdominal pain.  He was given muscle relaxers.  Lipase was mildly elevated however clinical finding was inconsistent with pancreatitis.  CT of abdomen pelvis showed right inguinal hernia contains loops of small bowel without obstruction, small fat-containing left inguinal hernia, small volume of left abdominal and pelvic ascites which has decreased compared with the previous exam.  Echocardiogram obtained during the hospitalization showed EF 25 to 30%.  Patient presents today for cardiology office visit.  He continues to have abdominal pain and has not been eating very well.  He has lost significant amount of weight.  He denies any exertional symptoms.  He just got a new left leg prosthesis that fit him much better after significant weight loss and he has been able to walk around without much issue.  He denies any shortness of breath as well.  EKG today showed no significant ST-T wave changes.  I discussed the case with DOD Dr. Alvester Chou, given low EF that was seen on the recent echocardiogram, we recommend a Lexiscan Myoview to further evaluate.  If Myoview does not show any reversible blockage, then patient  is cleared to proceed with surgery.    Past Medical History:  Diagnosis Date  . Anemia, unspecified   . Anxiety   . Arthralgia 2010   polyarticular  . Arthritis    "back, knees" (01/10/2017)  . Cancer [facility])    "kidney area" (01/10/2017)  . CHF (congestive heart failure) ([place]) 07/25/2009   denies  . Chronic  lower back pain   . Coronary artery disease   . Coughing    pt. reports that he has drainage from sinus infection  . Diabetic foot ulcer ([place])   . Diabetic neuropathy ([place])   . ESRD (end stage renal disease) on dialysis [facility])    started 12/2012; "MWF; [place] "  (01/10/2017)  . GERD (gastroesophageal reflux disease)    hx "before I lost weight", no problem 9 years  . Hemodialysis access site with mature fistula ([place])   . Hemorrhoids, internal 10/2011   small  . High cholesterol   . History of blood transfusion    "related to the anemia"  . Hypertension   . Insomnia, unspecified   . Lacunar infarction ([place]) 2006   RUE/RLE, speech  . Long term (current) use of anticoagulants   . Myocardial infarction ([place]) 1995  . Orthostatic hypotension   . Osteomyelitis of foot, left, acute ([place])   . Other chronic postoperative pain   . Pneumonia    "probably twice" (01/10/2017)  . Polymyalgia rheumatica ([place])   . Renal insufficiency   . Sleep apnea    "lost weight; no more problem" (01/10/2017)  . Stroke ([place]) 01/10/06   denies residual on 05/09/2014  . Type II diabetes mellitus ([place]) dx'd 1995  . Unspecified hereditary and idiopathic peripheral neuropathy    feet  . Unspecified osteomyelitis, site unspecified   . Unspecified vitamin D deficiency     Past Surgical History:  Procedure Laterality Date  . ABDOMINAL AORTOGRAM N/A 08/25/2016   Procedure: ABDOMINAL AORTOGRAM;  Surgeon: Wellington Hampshire, MD;  Location: [place] [facility];  Service: Cardiovascular;  Laterality: N/A;  . AMPUTATION  01/21/2012   Procedure: AMPUTATION RAY;  Surgeon: [name], MD;  Location: [place];  Service: Orthopedics;  Laterality: Left;  Left Foot 4th Ray Amputation  . AMPUTATION Left 05/04/2013   Procedure: AMPUTATION DIGIT;  Surgeon: [name], MD;  Location: [place];  Service: Orthopedics;  Laterality: Left;  Left Great Toe Amputation at MTP  . AMPUTATION Left 01/14/2017   Procedure: AMPUTATION  ABOVE LEFT KNEE;  Surgeon: [name], MD;  Location: [place];  Service: Orthopedics;  Laterality: Left;  . ANTERIOR CERVICAL DECOMP/DISCECTOMY FUSION  02/2011  . BACK SURGERY    . BASCILIC VEIN TRANSPOSITION Left 10/19/2012   Procedure: BASCILIC VEIN TRANSPOSITION;  Surgeon: Serafina Mitchell, MD;  Location: [place];  Service: Vascular;  Laterality: Left;  . CARDIAC CATHETERIZATION     "before bypass"  . CHOLECYSTECTOMY N/A 08/02/2018   Procedure: LAPAROSCOPIC CHOLECYSTECTOMY WITH INTRAOPERATIVE CHOLANGIOGRAM;  Surgeon: Donnie Mesa, MD;  Location: [place];  Service: General;  Laterality: N/A;  . CORONARY ARTERY BYPASS GRAFT     x 5 with lima at [place] SPACERS Left 08/07/2014   Procedure: Replace Left Total Knee Arthroplasty,  Place Antibiotic Spacer;  Surgeon: [name], MD;  Location: [place];  Service: Orthopedics;  Laterality: Left;  . I&D EXTREMITY Left 05/09/2014   Procedure: Irrigation and Debridement Left Knee and Closure of  Total Knee Arthroplasty Incision;  Surgeon: [name], MD;  Location: [place];  Service: Orthopedics;  Laterality: Left;  . I&D KNEE WITH POLY EXCHANGE Left 05/31/2014   Procedure: IRRIGATION AND DEBRIDEMENT LEFT KNEE, PLACE ANTIBIOTIC BEADS,  POLY EXCHANGE;  Surgeon: [name], MD;  Location: [place];  Service: Orthopedics;  Laterality: Left;  . IRRIGATION AND DEBRIDEMENT KNEE Left 01/12/2017   Procedure: IRRIGATION AND DEBRIDEMENT LEFT KNEE;  Surgeon: [name], MD;  Location: [place];  Service: Orthopedics;  Laterality: Left;  . JOINT REPLACEMENT    . KNEE ARTHROSCOPY Left 08-25-2012  . LOWER EXTREMITY ANGIOGRAPHY Left 08/25/2016   Procedure: Lower Extremity Angiography;  Surgeon: Wellington Hampshire, MD;  Location: [place] [facility];  Service: Cardiovascular;  Laterality: Left;  . PERIPHERAL VASCULAR BALLOON ANGIOPLASTY Left 08/25/2016   Procedure: PERIPHERAL VASCULAR BALLOON ANGIOPLASTY;  Surgeon:  Wellington Hampshire, MD;  Location: [place] [facility];  Service: Cardiovascular;  Laterality: Left;  lt peroneal and ant tibial arteries cutting balloon  . REFRACTIVE SURGERY Bilateral   . TOE AMPUTATION Bilateral    "I've lost 7 toes over the last 7 years" (05/09/2014)  . TOE SURGERY Left April 2015   Big toe removed on left foot.  . TONSILLECTOMY    . TOTAL KNEE ARTHROPLASTY Left 04/10/2014   Procedure: TOTAL KNEE ARTHROPLASTY;  Surgeon: [name], MD;  Location: [place];  Service: Orthopedics;  Laterality: Left;  . TOTAL KNEE REVISION Left 10/25/2014   Procedure: LEFT TOTAL KNEE REVISION;  Surgeon: [name], MD;  Location: [place];  Service: Orthopedics;  Laterality: Left;  . TOTAL KNEE REVISION Left 11/26/2015   Procedure: Removal Left Total Knee Arthroplasty, Hinged Total Knee Arthroplasty;  Surgeon: [name], MD;  Location: [place];  Service: Orthopedics;  Laterality: Left;  . UVULOPALATOPHARYNGOPLASTY, TONSILLECTOMY AND SEPTOPLASTY  ~ 1989  . WOUND DEBRIDEMENT Left 05/09/2014   Dehiscence Left Total Knee Arthroplasty Incision    Current Medications: Outpatient Medications Prior to Visit  Medication Sig Dispense Refill  . amLODipine (NORVASC) 5 MG tablet Take 5 mg by mouth daily.     Marland Kitchen aspirin EC 81 MG EC tablet Take 2 tablets (162 mg total) by mouth daily. (Patient taking differently: Take 81 mg by mouth daily. )    . carvedilol (COREG) 12.5 MG tablet Take 1 tablet (12.5 mg total) by mouth 2 (two) times daily with a meal. 60 tablet 0  . Darbepoetin Alfa (ARANESP) 150 MCG/0.3ML SOSY injection Inject 0.3 mLs (150 mcg total) into the vein every Friday with hemodialysis. 1.68 mL 0  . dicyclomine (BENTYL) 20 MG tablet Take 1 tablet (20 mg total) by mouth every 6 (six) hours as needed for spasms (abdominal pain). 12 tablet 0  . gabapentin (NEURONTIN) 100 MG capsule Take 200 mg by mouth every evening.    Marland Kitchen HYDROcodone-acetaminophen (NORCO/VICODIN) 5-325 MG tablet Take 1 tablet by  mouth every 6 (six) hours as needed for moderate pain. 20 tablet 0  . hydrOXYzine (ATARAX/VISTARIL) 50 MG tablet Take 50 mg by mouth 3 (three) times daily as needed for itching.     . methocarbamol (ROBAXIN) 500 MG tablet Take 1 tablet (500 mg total) by mouth every 8 (eight) hours as needed for muscle spasms. 8 tablet 0  . multivitamin (RENA-VIT) TABS tablet Take 1 tablet by mouth at bedtime. 30 tablet 0  . sevelamer carbonate (RENVELA) 800 MG tablet Take 24,000-32,000 mg by mouth daily.     Marland Kitchen  temazepam (RESTORIL) 30 MG capsule Take 30 mg by mouth at bedtime.      No facility-administered medications prior to visit.      Allergies:   Morphine and related, Tygacil [tigecycline], and Imodium [loperamide]   Social History   Socioeconomic History  . Marital status: Legally Separated    Spouse name: Not on file  . Number of children: Not on file  . Years of education: Not on file  . Highest education level: Not on file  Occupational History  . Not on file  Social Needs  . Financial resource strain: Not on file  . Food insecurity    Worry: Not on file    Inability: Not on file  . Transportation needs    Medical: Not on file    Non-medical: Not on file  Tobacco Use  . Smoking status: Current Some Day Smoker    Packs/day: 0.12    Years: 32.00    Pack years: 3.84    Types: Cigarettes    Last attempt to quit: 10/05/2016    Years since quitting: 2.0  . Smokeless tobacco: Never Used  . Tobacco comment: 1 per week  Substance and Sexual Activity  . Alcohol use: No    Alcohol/week: 0.0 standard drinks  . Drug use: No  . Sexual activity: Not Currently  Lifestyle  . Physical activity    Days per week: Not on file    Minutes per session: Not on file  . Stress: Not on file  Relationships  . Social Herbalist on phone: Not on file    Gets together: Not on file    Attends religious service: Not on file    Active member of club or organization: Not on file    Attends  meetings of clubs or organizations: Not on file    Relationship status: Not on file  Other Topics Concern  . Not on file  Social History Narrative  . Not on file     Family History:  The patient's family history includes Cancer (age of onset: 41) in his mother; Heart disease in his maternal aunt; Hypertension in his mother; Stroke in his maternal grandfather.   ROS:   Please see the history of present illness.    ROS All other systems reviewed and are negative.   PHYSICAL EXAM:   VS:  BP (!) 154/90   Pulse 84   Temp (!) 97.5 F (36.4 C)   Ht 5\' 11"  (1.803 m)   Wt 143 lb (64.9 kg)   SpO2 100%   BMI 19.94 kg/m    GEN: Well nourished, well developed, in no acute distress  HEENT: normal  Neck: no JVD, carotid bruits, or masses Cardiac: RRR; no murmurs, rubs, or gallops,no edema  Respiratory:  clear to auscultation bilaterally, normal work of breathing GI: soft, nontender, nondistended, + BS MS: no deformity or atrophy  Skin: warm and dry, no rash Neuro:  Alert and Oriented x 3, Strength and sensation are intact Psych: euthymic mood, full affect  Wt Readings from Last 3 Encounters:  10/11/18 143 lb (64.9 kg)  10/09/18 142 lb 3.2 oz (64.5 kg)  10/03/18 145 lb 8.1 oz (66 kg)      Studies/Labs Reviewed:   EKG:  EKG is ordered today.  The ekg ordered today demonstrates normal sinus rhythm without significant ST-T wave changes  Recent Labs: 08/01/2018: TSH 3.359 08/02/2018: Magnesium 2.1 10/09/2018: ALT 11; BUN 48; Creatinine, Ser 7.22; Hemoglobin 11.4; Platelets  153; Potassium 5.2; Sodium 137   Lipid Panel    Component Value Date/Time   CHOL 97 02/07/2017 0612   CHOL 228 (H) 09/15/2012 1030   TRIG 77 02/07/2017 0612   HDL 39 (L) 02/07/2017 0612   HDL 40 09/15/2012 1030   CHOLHDL 2.5 02/07/2017 0612   VLDL 15 02/07/2017 0612   LDLCALC 43 02/07/2017 0612   LDLCALC 113 (H) 09/15/2012 1030    Additional studies/ records that were reviewed today include:   Echo  08/01/2018 IMPRESSIONS    1. The left ventricle has severely reduced systolic function, with an ejection fraction of 25-30%. The cavity size was normal. Left ventricular diastolic function could not be evaluated due to nondiagnostic images. Left ventricular diffuse hypokinesis.  2. Spontaneous echo contrast noted in LV suggestive of low flow.  3. The right ventricle has moderately reduced systolic function. The cavity was severely enlarged. There is no increase in right ventricular wall thickness. Right ventricular systolic pressure is mildly elevated with PASP 28mmHg.  4. Left atrial size was moderately dilated.  5. Right atrial size was moderately dilated.  6. The aortic valve is tricuspid. Moderate sclerosis of the aortic valve. Moderate aortic annular calcification noted.  7. There is reduced motion of AV cusps but no doppler interrogation of the AV was done.  8. The mitral valve is degenerative. Moderate thickening of the mitral valve leaflet. There is mild mitral annular calcification present.  9. The inferior vena cava was dilated in size with <50% respiratory variability. 10. Recommend limited echo to assess for AS. 11. The aorta is normal in size and structure.    ASSESSMENT:    1. Pre-procedural examination   2. LV dysfunction   3. Coronary artery disease involving coronary bypass graft of native heart without angina pectoris   4. Essential hypertension   5. Controlled type 2 diabetes mellitus without complication, without long-term current use of insulin ([place])   6. ESRD (end stage renal disease) on dialysis ([place])   7. H/O: CVA (cerebrovascular accident)   8. PAD (peripheral artery disease) ([place])   9. Hx of AKA (above knee amputation), left ([facility])      PLAN:  In order of problems listed above:  1. Preoperative clearance: Requested by Dr. Coralie Keens prior to inguinal hernia repair.  Although he does not have any anginal symptoms, however recent echocardiogram showed  a significant drop in ejection fraction.  I discussed the case with Dr. Gwenlyn Found, DOD, we will proceed with Myoview to risk stratify.  2. LV dysfunction: Newly diagnosed on recent echocardiogram  3. CAD s/p CABG: Denies any obvious anginal symptoms  4. Hypertension: Blood pressure stable  5. DM2: Managed by primary care provider  6. End-stage renal disease on dialysis: Managed by nephrology service  7. History of CVA: No recent recurrence  8. History of PAD s/p left AKA: Leg prosthesis present.    Medication Adjustments/Labs and Tests Ordered: Current medicines are reviewed at length with the patient today.  Concerns regarding medicines are outlined above.  Medication changes, Labs and Tests ordered today are listed in the Patient Instructions below. Patient Instructions  Medication Instructions:  Your physician recommends that you continue on your current medications as directed. Please refer to the Current Medication list given to you today.  If you need a refill on your cardiac medications before your next appointment, please call your pharmacy.   Lab work: NONE ordered at this time of appointment   If you have labs (blood  work) drawn today and your tests are completely normal, you will receive your results only by: Marland Kitchen MyChart Message (if you have MyChart) OR . A paper copy in the mail If you have any lab test that is abnormal or we need to change your treatment, we will call you to review the results.  Testing/Procedures: Your physician has requested that you have a lexiscan myoview. For further information please visit [email or web address]. Please follow instruction sheet, as given.   Please schedule for 1 week  Follow-Up: At [facility], you and your health needs are our priority.  As part of our continuing mission to provide you with exceptional heart care, we have created designated Provider Care Teams.  These Care Teams include your primary Cardiologist (physician) and  Advanced Practice Providers (APPs -  Physician Assistants and Nurse Practitioners) who all work together to provide you with the care you need, when you need it. . You will need a follow up appointment in 6 months with Kathlyn Sacramento, MD  Any Other Special Instructions Will Be Listed Below (If Applicable).       Hilbert Corrigan, [place]  10/12/2018 11:39 PM    [facility] [place], [place], [place]  [address] Phone: [phone]; Fax: [phone]

## 2018-10-11 NOTE — Telephone Encounter (Signed)
New Message  Patient is calling back in stating that he was on the line with Almyra Deforest Pa and was somehow disconnected. Please give patient a call back.

## 2018-10-11 NOTE — Patient Instructions (Addendum)
Medication Instructions:  Your physician recommends that you continue on your current medications as directed. Please refer to the Current Medication list given to you today.  If you need a refill on your cardiac medications before your next appointment, please call your pharmacy.   Lab work: NONE ordered at this time of appointment   If you have labs (blood work) drawn today and your tests are completely normal, you will receive your results only by: Marland Kitchen MyChart Message (if you have MyChart) OR . A paper copy in the mail If you have any lab test that is abnormal or we need to change your treatment, we will call you to review the results.  Testing/Procedures: Your physician has requested that you have a lexiscan myoview. For further information please visit [email or web address]. Please follow instruction sheet, as given.   Please schedule for 1 week  Follow-Up: At [facility], you and your health needs are our priority.  As part of our continuing mission to provide you with exceptional heart care, we have created designated Provider Care Teams.  These Care Teams include your primary Cardiologist (physician) and Advanced Practice Providers (APPs -  Physician Assistants and Nurse Practitioners) who all work together to provide you with the care you need, when you need it. . You will need a follow up appointment in 6 months with Kathlyn Sacramento, MD  Any Other Special Instructions Will Be Listed Below (If Applicable).

## 2018-10-11 NOTE — Telephone Encounter (Signed)
I have called Matthew Kline and discussed with him. Thank you

## 2018-10-11 NOTE — Progress Notes (Signed)
   Primary Cardiologist: Dr. Fletcher Anon  Patient was seen in the cardiology office today. He denies any recent chest or exertional dyspnea. He was previous cleared for laparoscopic gallbladder surgery in July 2020. Postop course was uneventful. He is able to walk with prosthetic leg without any issue. Matthew Kline would be at acceptable risk for the planned procedure without further cardiovascular testing. I discussed the case with Dr. Quay Burow DOD who felt he is low to moderate risk to proceed with surgery.  I will route this recommendation to the requesting party via Epic fax function.  Please call with questions.  [place], [place] 10/11/2018, 12:16 PM

## 2018-10-12 ENCOUNTER — Encounter: Payer: Self-pay | Admitting: Physician Assistant

## 2018-10-12 ENCOUNTER — Encounter (HOSPITAL_COMMUNITY): Admission: RE | Payer: Self-pay | Source: Home / Self Care

## 2018-10-12 ENCOUNTER — Ambulatory Visit (HOSPITAL_COMMUNITY): Admission: RE | Admit: 2018-10-12 | Payer: Medicare HMO | Source: Home / Self Care | Admitting: Surgery

## 2018-10-12 SURGERY — REPAIR, HERNIA, INGUINAL, BILATERAL, ADULT
Anesthesia: General | Laterality: Bilateral

## 2018-10-13 ENCOUNTER — Telehealth (HOSPITAL_COMMUNITY): Payer: Self-pay

## 2018-10-13 NOTE — Telephone Encounter (Signed)
Encounter complete. 

## 2018-10-17 ENCOUNTER — Ambulatory Visit (HOSPITAL_COMMUNITY)
Admission: RE | Admit: 2018-10-17 | Discharge: 2018-10-17 | Disposition: A | Discharge status: Home / Self Care | Payer: Medicare HMO | Source: Ambulatory Visit | Attending: Cardiovascular Disease | Admitting: Cardiovascular Disease

## 2018-10-17 ENCOUNTER — Other Ambulatory Visit: Payer: Self-pay

## 2018-10-17 DIAGNOSIS — Z89612 Acquired absence of left leg above knee: Secondary | ICD-10-CM | POA: Diagnosis not present

## 2018-10-17 DIAGNOSIS — E1142 Type 2 diabetes mellitus with diabetic polyneuropathy: Secondary | ICD-10-CM | POA: Diagnosis not present

## 2018-10-17 DIAGNOSIS — I2510 Atherosclerotic heart disease of native coronary artery without angina pectoris: Secondary | ICD-10-CM | POA: Insufficient documentation

## 2018-10-17 DIAGNOSIS — N186 End stage renal disease: Secondary | ICD-10-CM | POA: Insufficient documentation

## 2018-10-17 DIAGNOSIS — Z8673 Personal history of transient ischemic attack (TIA), and cerebral infarction without residual deficits: Secondary | ICD-10-CM | POA: Diagnosis not present

## 2018-10-17 DIAGNOSIS — I509 Heart failure, unspecified: Secondary | ICD-10-CM | POA: Insufficient documentation

## 2018-10-17 DIAGNOSIS — E1122 Type 2 diabetes mellitus with diabetic chronic kidney disease: Secondary | ICD-10-CM | POA: Diagnosis not present

## 2018-10-17 DIAGNOSIS — I519 Heart disease, unspecified: Secondary | ICD-10-CM | POA: Diagnosis not present

## 2018-10-17 DIAGNOSIS — Z86718 Personal history of other venous thrombosis and embolism: Secondary | ICD-10-CM | POA: Insufficient documentation

## 2018-10-17 DIAGNOSIS — Z951 Presence of aortocoronary bypass graft: Secondary | ICD-10-CM | POA: Insufficient documentation

## 2018-10-17 DIAGNOSIS — E1151 Type 2 diabetes mellitus with diabetic peripheral angiopathy without gangrene: Secondary | ICD-10-CM | POA: Diagnosis not present

## 2018-10-17 DIAGNOSIS — K219 Gastro-esophageal reflux disease without esophagitis: Secondary | ICD-10-CM | POA: Diagnosis not present

## 2018-10-17 DIAGNOSIS — Z01818 Encounter for other preprocedural examination: Secondary | ICD-10-CM | POA: Diagnosis not present

## 2018-10-17 DIAGNOSIS — Z992 Dependence on renal dialysis: Secondary | ICD-10-CM | POA: Insufficient documentation

## 2018-10-17 DIAGNOSIS — I132 Hypertensive heart and chronic kidney disease with heart failure and with stage 5 chronic kidney disease, or end stage renal disease: Secondary | ICD-10-CM | POA: Diagnosis not present

## 2018-10-17 LAB — MYOCARDIAL PERFUSION IMAGING
LV dias vol: 226 mL (ref 62–150)
LV sys vol: 159 mL
Peak HR: 82 {beats}/min
Rest HR: 81 {beats}/min
SDS: 0
SRS: 1
SSS: 1
TID: 1.04

## 2018-10-17 MED ORDER — TECHNETIUM TC 99M TETROFOSMIN IV KIT
31.9000 | PACK | Freq: Once | INTRAVENOUS | Status: AC | PRN
  Administered 2018-10-17: 31.9 via INTRAVENOUS
  Filled 2018-10-17: qty 32

## 2018-10-17 MED ORDER — REGADENOSON 0.4 MG/5ML IV SOLN
0.4000 mg | Freq: Once | INTRAVENOUS | Status: AC
  Administered 2018-10-17: 0.4 mg via INTRAVENOUS

## 2018-10-17 MED ORDER — TECHNETIUM TC 99M TETROFOSMIN IV KIT
9.9000 | PACK | Freq: Once | INTRAVENOUS | Status: AC | PRN
  Administered 2018-10-17: 9.9 via INTRAVENOUS
  Filled 2018-10-17: qty 10

## 2018-10-19 ENCOUNTER — Encounter: Payer: Self-pay | Admitting: Physician Assistant

## 2018-10-19 NOTE — Progress Notes (Signed)
   Primary Cardiologist: Kathlyn Sacramento, MD  Patient recently underwent myoview for preoperative clearance given LV dysfunction that was seen on previous echo. The myoview was indeterminate due to inadequate HR and BP response to stress. I discussed the case with Dr. Fletcher Anon who recommended clearance to proceed with surgery as a moderate risk patient given lack of anginal symptom.  Antawan Mchugh (DOB [birth year]) would be at acceptable risk for the planned procedure without further cardiovascular testing.   Please call with questions.  [place], [place] 10/19/2018, 3:04 PM

## 2018-10-19 NOTE — Progress Notes (Signed)
Thank you :)

## 2018-10-19 NOTE — Progress Notes (Signed)
Discussed with Dr. Fletcher Anon, since patient does not have any anginal symptom and myoview is indeterminate study with inadequate heart rate response, we recommend clearance to proceed with surgery as a moderate risk patient

## 2018-10-23 ENCOUNTER — Emergency Department (HOSPITAL_COMMUNITY)
Admission: EM | Admit: 2018-10-23 | Discharge: 2018-10-23 | Disposition: A | Discharge status: Home / Self Care | Payer: Medicare HMO | Attending: Emergency Medicine | Admitting: Emergency Medicine

## 2018-10-23 ENCOUNTER — Emergency Department (HOSPITAL_COMMUNITY): Payer: Medicare HMO

## 2018-10-23 ENCOUNTER — Encounter (HOSPITAL_COMMUNITY): Payer: Self-pay | Admitting: Emergency Medicine

## 2018-10-23 DIAGNOSIS — Z20828 Contact with and (suspected) exposure to other viral communicable diseases: Secondary | ICD-10-CM | POA: Diagnosis not present

## 2018-10-23 DIAGNOSIS — Z951 Presence of aortocoronary bypass graft: Secondary | ICD-10-CM | POA: Insufficient documentation

## 2018-10-23 DIAGNOSIS — B349 Viral infection, unspecified: Secondary | ICD-10-CM | POA: Insufficient documentation

## 2018-10-23 DIAGNOSIS — F17210 Nicotine dependence, cigarettes, uncomplicated: Secondary | ICD-10-CM | POA: Diagnosis not present

## 2018-10-23 DIAGNOSIS — I509 Heart failure, unspecified: Secondary | ICD-10-CM | POA: Insufficient documentation

## 2018-10-23 DIAGNOSIS — N186 End stage renal disease: Secondary | ICD-10-CM | POA: Insufficient documentation

## 2018-10-23 DIAGNOSIS — R091 Pleurisy: Secondary | ICD-10-CM | POA: Diagnosis present

## 2018-10-23 DIAGNOSIS — I132 Hypertensive heart and chronic kidney disease with heart failure and with stage 5 chronic kidney disease, or end stage renal disease: Secondary | ICD-10-CM | POA: Insufficient documentation

## 2018-10-23 DIAGNOSIS — Z7982 Long term (current) use of aspirin: Secondary | ICD-10-CM | POA: Diagnosis not present

## 2018-10-23 DIAGNOSIS — Z992 Dependence on renal dialysis: Secondary | ICD-10-CM | POA: Diagnosis not present

## 2018-10-23 DIAGNOSIS — E1122 Type 2 diabetes mellitus with diabetic chronic kidney disease: Secondary | ICD-10-CM | POA: Diagnosis not present

## 2018-10-23 DIAGNOSIS — Z79899 Other long term (current) drug therapy: Secondary | ICD-10-CM | POA: Diagnosis not present

## 2018-10-23 DIAGNOSIS — Z794 Long term (current) use of insulin: Secondary | ICD-10-CM | POA: Insufficient documentation

## 2018-10-23 DIAGNOSIS — Z9189 Other specified personal risk factors, not elsewhere classified: Secondary | ICD-10-CM | POA: Diagnosis not present

## 2018-10-23 LAB — CBC
HCT: 35.7 % — ABNORMAL LOW (ref 39.0–52.0)
Hemoglobin: 11.6 g/dL — ABNORMAL LOW (ref 13.0–17.0)
MCH: 30.9 pg (ref 26.0–34.0)
MCHC: 32.5 g/dL (ref 30.0–36.0)
MCV: 94.9 fL (ref 80.0–100.0)
Platelets: 148 10*3/uL — ABNORMAL LOW (ref 150–400)
RBC: 3.76 MIL/uL — ABNORMAL LOW (ref 4.22–5.81)
RDW: 16.2 % — ABNORMAL HIGH (ref 11.5–15.5)
WBC: 6 10*3/uL (ref 4.0–10.5)
nRBC: 0 % (ref 0.0–0.2)

## 2018-10-23 LAB — BASIC METABOLIC PANEL
Anion gap: 16 — ABNORMAL HIGH (ref 5–15)
BUN: 53 mg/dL — ABNORMAL HIGH (ref 6–20)
CO2: 24 mmol/L (ref 22–32)
Calcium: 9.5 mg/dL (ref 8.9–10.3)
Chloride: 97 mmol/L — ABNORMAL LOW (ref 98–111)
Creatinine, Ser: 6.46 mg/dL — ABNORMAL HIGH (ref 0.61–1.24)
GFR calc Af Amer: 10 mL/min — ABNORMAL LOW (ref >60)
GFR calc non Af Amer: 9 mL/min — ABNORMAL LOW (ref >60)
Glucose, Bld: 117 mg/dL — ABNORMAL HIGH (ref 70–99)
Potassium: 5.2 mmol/L — ABNORMAL HIGH (ref 3.5–5.1)
Sodium: 137 mmol/L (ref 135–145)

## 2018-10-23 LAB — TROPONIN I (HIGH SENSITIVITY)
Troponin I (High Sensitivity): 24 ng/L — ABNORMAL HIGH (ref <18)
Troponin I (High Sensitivity): 22 ng/L — ABNORMAL HIGH (ref <18)

## 2018-10-23 LAB — SARS CORONAVIRUS 2 BY RT PCR (HOSPITAL ORDER, PERFORMED IN ~~LOC~~ HOSPITAL LAB): SARS Coronavirus 2: NEGATIVE

## 2018-10-23 MED ORDER — SODIUM CHLORIDE 0.9% FLUSH: 3.0000 mL | Freq: Once | INTRAVENOUS | Status: DC

## 2018-10-23 MED ORDER — SODIUM ZIRCONIUM CYCLOSILICATE 5 G PO PACK
5.0000 g | PACK | Freq: Once | ORAL | Status: AC
  Administered 2018-10-23: 18:00:00 5 g via ORAL
  Filled 2018-10-23 (×2): qty 1

## 2018-10-23 NOTE — ED Triage Notes (Signed)
Pt reports pleurisy with deep breathing. Has Has Monday, Wed, Friday dialysis but didn't go until Saturday because he wasn't feeling well. Had a COVID test yesterday told by nephrologist to come to hospital because results weren't back yet. Denies recent fever. VSS. Nad at present.

## 2018-10-23 NOTE — ED Provider Notes (Signed)
[facility] EMERGENCY DEPARTMENT Provider Note   CSN: [id number] Arrival date & time: 10/23/18  1211     History   Chief Complaint Chief Complaint  Patient presents with  . Pleurisy    HPI Jadiel Schmieder is a 58 y.o. male.     HPI  The patient is a 58 y.o. male with past medical history of CAD s/p CABG in May 2018, DM 2, ESRD on HD, HTN, CVA 2005, tobacco abuse, PAD and peripheral neuropathy who presents for a COVID test.   The patient gets HD MWF but was last dialyzed on Saturday. He endorses a temps of 99.49F at home but no fevers. He endorses shortness of breath and chest pain described as tightness across the front of this chest with an associated sharp pain on the left. He also endorses a sore throat, generalized myalgias, and extreme fatigue. He lost his sense of taste on Friday. He states that he feels as if he has a bad cold. He was sent to the ED for a COVID test. He cannot receive dialysis until he gets a COVID test completed. He endorses a mild cough productive of pink sputum. He states that he stayed in bed all of Friday and could not make it to dialysis because he did not feel well.   He endorse abdominal pain from known hernias and six months of diarrhea. He has bilateral inguinal hernias for which he has been evaluated for surgery.   Past Medical History:  Diagnosis Date  . Anemia, unspecified   . Anxiety   . Arthralgia 2010   polyarticular  . Arthritis    "back, knees" (01/10/2017)  . Cancer [facility])    "kidney area" (01/10/2017)  . CHF (congestive heart failure) ([place]) 07/25/2009   denies  . Chronic lower back pain   . Coronary artery disease   . Coughing    pt. reports that he has drainage from sinus infection  . Diabetic foot ulcer ([place])   . Diabetic neuropathy ([place])   . ESRD (end stage renal disease) on dialysis [facility])    started 12/2012; "MWF; [facility] "  (01/10/2017)  . GERD (gastroesophageal reflux disease)    hx "before I  lost weight", no problem 9 years  . Hemodialysis access site with mature fistula ([place])   . Hemorrhoids, internal 10/2011   small  . High cholesterol   . History of blood transfusion    "related to the anemia"  . Hypertension   . Insomnia, unspecified   . Lacunar infarction ([place]) 2006   RUE/RLE, speech  . Long term (current) use of anticoagulants   . Myocardial infarction ([place]) 1995  . Orthostatic hypotension   . Osteomyelitis of foot, left, acute ([place])   . Other chronic postoperative pain   . Pneumonia    "probably twice" (01/10/2017)  . Polymyalgia rheumatica ([place])   . Renal insufficiency   . Sleep apnea    "lost weight; no more problem" (01/10/2017)  . Stroke ([place]) 01/10/06   denies residual on 05/09/2014  . Type II diabetes mellitus ([place]) dx'd 1995  . Unspecified hereditary and idiopathic peripheral neuropathy    feet  . Unspecified osteomyelitis, site unspecified   . Unspecified vitamin D deficiency     Patient Active Problem List   Diagnosis Date Noted  . Biliary colic 52/84/1324  . Acute cholecystitis 07/31/2018  . Prolonged QT interval 07/31/2018  . Acute respiratory failure with hypoxia ([place]) 07/10/2018  . Abdominal pain 07/10/2018  .  Bilateral recurrent inguinal hernia without obstruction or gangrene   . Altered mental status   . Cerebral thrombosis with cerebral infarction 02/06/2017  . Cerebral embolism with cerebral infarction 02/06/2017  . Hx of AKA (above knee amputation), left ([place])   . Pressure injury of skin 01/30/2017  . Acute lower UTI 01/29/2017  . Acute metabolic encephalopathy 12/45/8099  . UTI (urinary tract infection) 01/29/2017  . Quadriceps muscle rupture, left, initial encounter   . Fall 01/09/2017  . Gait disturbance 01/09/2017  . Staphylococcus aureus infection 01/09/2017  . Infection of prosthetic left knee joint ([place]) 01/09/2017  . Fever, unknown origin 11/09/2016  . Critical lower limb ischemia 08/25/2016  . Ulcer of left midfoot  with fat layer exposed ([place]) 08/13/2016  . Diabetic ulcer of left midfoot associated with type 2 diabetes mellitus, with fat layer exposed ([place]) 07/25/2016  . Peripheral neuropathy 07/22/2016  . Tobacco abuse 07/22/2016  . CAD in native artery 05/18/2016  . CAD, multiple vessel 05/11/2016  . Positive cardiac stress test 05/11/2016  . Abnormal stress test 04/30/2016  . Pre-transplant evaluation for kidney transplant 04/30/2016  . S/P revision of total knee 11/26/2015  . Pain in the chest   . Acute on chronic diastolic heart failure ([place]) 07/05/2015  . Volume overload 07/04/2015  . Shortness of breath 07/04/2015  . Hypoxemia 07/04/2015  . Elevated troponin   . End-stage renal disease on hemodialysis ([place])   . Hypervolemia   . 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 Pyogenic bacterial arthritis of knee, left ([place]) 08/07/2014  . Tachycardia 07/24/2014  . Acute upper respiratory infection 07/24/2014  . ESRD on dialysis ([place]) 07/14/2014  . Type II diabetes mellitus ([place]) 07/14/2014  . Anemia in chronic kidney disease 07/14/2014  . Congestive heart failure (CHF) ([place]) 07/13/2014  . Surgical wound dehiscence 05/09/2014  . Dehiscence of closure of skin 05/09/2014  . Total knee replacement status 04/10/2014  . Diabetes mellitus with renal manifestations, controlled ([place]) 10/24/2013  . Hypertensive renal disease 06/27/2013  . DM type 2 causing vascular disease ([place]) 06/27/2013  . Erectile dysfunction 06/27/2013  . Depression 06/27/2013  . Claudication of left lower extremity ([place]) 12/19/2012  . Essential hypertension, benign 12/19/2012  . Sinusitis, acute maxillary 11/22/2012  . Otitis, externa, infective 11/14/2012  . Leg edema, left 11/14/2012  . End stage renal disease ([place]) 10/02/2012  . Controlled type 2 DM with proteinuria or microalbuminuria 09/19/2012  . GERD (gastroesophageal reflux disease) 09/19/2012  . Leukocytosis 09/19/2012  . Lacunar infarction ([place])  08/17/2012  . Polymyalgia rheumatica ([place]) 08/17/2012  . 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 Essential hypertension 05/10/2012  . Vitamin D deficiency 05/10/2012  . Diabetes mellitus due to underlying condition ([place]) 05/10/2012  . Hyperlipidemia LDL goal <100 05/10/2012  . Anemia of chronic disease 05/10/2012  . Screening for prostate cancer 05/10/2012  . Chronic kidney disease (CKD), stage IV (severe) ([place]) 05/10/2012  . Peripheral autonomic neuropathy due to DM ([place]) 05/10/2012  . Callus of foot 05/10/2012  . Urgency of urination 05/10/2012  . Hyperkalemia 05/10/2012  . Candidiasis of the esophagus 10/12/2011  . Internal hemorrhoids without mention of complication 83/38/2505  . Pre-syncope 07/25/2009  . DJD (degenerative joint disease) of cervical spine 02/17/2009    Past Surgical History:  Procedure Laterality Date  . ABDOMINAL AORTOGRAM N/A 08/25/2016   Procedure: ABDOMINAL AORTOGRAM;  Surgeon: Wellington Hampshire, MD;  Location: [facility] CV LAB;  Service: Cardiovascular;  Laterality: N/A;  . AMPUTATION  01/21/2012  Procedure: AMPUTATION RAY;  Surgeon: [name], MD;  Location: [facility];  Service: Orthopedics;  Laterality: Left;  Left Foot 4th Ray Amputation  . AMPUTATION Left 05/04/2013   Procedure: AMPUTATION DIGIT;  Surgeon: [name], MD;  Location: [facility];  Service: Orthopedics;  Laterality: Left;  Left Great Toe Amputation at MTP  . AMPUTATION Left 01/14/2017   Procedure: AMPUTATION ABOVE LEFT KNEE;  Surgeon: [name], MD;  Location: [facility];  Service: Orthopedics;  Laterality: Left;  . ANTERIOR CERVICAL DECOMP/DISCECTOMY FUSION  02/2011  . BACK SURGERY    . BASCILIC VEIN TRANSPOSITION Left 10/19/2012   Procedure: BASCILIC VEIN TRANSPOSITION;  Surgeon: Serafina Mitchell, MD;  Location: [facility];  Service: Vascular;  Laterality: Left;  . CARDIAC CATHETERIZATION     "before bypass"  . CHOLECYSTECTOMY N/A 08/02/2018   Procedure: LAPAROSCOPIC CHOLECYSTECTOMY WITH  INTRAOPERATIVE CHOLANGIOGRAM;  Surgeon: Donnie Mesa, MD;  Location: [facility];  Service: General;  Laterality: N/A;  . CORONARY ARTERY BYPASS GRAFT     x 5 with lima at [place] SPACERS Left 08/07/2014   Procedure: Replace Left Total Knee Arthroplasty,  Place Antibiotic Spacer;  Surgeon: [name], MD;  Location: [facility];  Service: Orthopedics;  Laterality: Left;  . I&D EXTREMITY Left 05/09/2014   Procedure: Irrigation and Debridement Left Knee and Closure of Total Knee Arthroplasty Incision;  Surgeon: [name], MD;  Location: [facility];  Service: Orthopedics;  Laterality: Left;  . I&D KNEE WITH POLY EXCHANGE Left 05/31/2014   Procedure: IRRIGATION AND DEBRIDEMENT LEFT KNEE, PLACE ANTIBIOTIC BEADS,  POLY EXCHANGE;  Surgeon: [name], MD;  Location: [facility];  Service: Orthopedics;  Laterality: Left;  . IRRIGATION AND DEBRIDEMENT KNEE Left 01/12/2017   Procedure: IRRIGATION AND DEBRIDEMENT LEFT KNEE;  Surgeon: [name], MD;  Location: [facility];  Service: Orthopedics;  Laterality: Left;  . JOINT REPLACEMENT    . KNEE ARTHROSCOPY Left 08-25-2012  . LOWER EXTREMITY ANGIOGRAPHY Left 08/25/2016   Procedure: Lower Extremity Angiography;  Surgeon: Wellington Hampshire, MD;  Location: [facility] CV LAB;  Service: Cardiovascular;  Laterality: Left;  . PERIPHERAL VASCULAR BALLOON ANGIOPLASTY Left 08/25/2016   Procedure: PERIPHERAL VASCULAR BALLOON ANGIOPLASTY;  Surgeon: Wellington Hampshire, MD;  Location: [facility] CV LAB;  Service: Cardiovascular;  Laterality: Left;  lt peroneal and ant tibial arteries cutting balloon  . REFRACTIVE SURGERY Bilateral   . TOE AMPUTATION Bilateral    "I've lost 7 toes over the last 7 years" (05/09/2014)  . TOE SURGERY Left April 2015   Big toe removed on left foot.  . TONSILLECTOMY    . TOTAL KNEE ARTHROPLASTY Left 04/10/2014   Procedure: TOTAL KNEE ARTHROPLASTY;  Surgeon: [name], MD;  Location: [facility];  Service:  Orthopedics;  Laterality: Left;  . TOTAL KNEE REVISION Left 10/25/2014   Procedure: LEFT TOTAL KNEE REVISION;  Surgeon: [name], MD;  Location: [facility];  Service: Orthopedics;  Laterality: Left;  . TOTAL KNEE REVISION Left 11/26/2015   Procedure: Removal Left Total Knee Arthroplasty, Hinged Total Knee Arthroplasty;  Surgeon: [name], MD;  Location: [facility];  Service: Orthopedics;  Laterality: Left;  . UVULOPALATOPHARYNGOPLASTY, TONSILLECTOMY AND SEPTOPLASTY  ~ 1989  . WOUND DEBRIDEMENT Left 05/09/2014   Dehiscence Left Total Knee Arthroplasty Incision        Home Medications    Prior to Admission medications   Medication Sig Start  Date End Date Taking? Authorizing Provider  acetaminophen (TYLENOL) 500 MG tablet Take 1,000 mg by mouth every 6 (six) hours as needed for mild pain or fever.   Yes [provider]  amLODipine (NORVASC) 5 MG tablet Take 5 mg by mouth every evening.    Yes [provider]  aspirin EC 81 MG EC tablet Take 2 tablets (162 mg total) by mouth daily. Patient taking differently: Take 81 mg by mouth at bedtime.  01/20/17  Yes Patrecia Pour, Christean Grief, MD  carvedilol (COREG) 12.5 MG tablet Take 1 tablet (12.5 mg total) by mouth 2 (two) times daily with a meal. 02/08/17  Yes Bonnell Public, MD  Darbepoetin Alfa (ARANESP) 150 MCG/0.3ML SOSY injection Inject 0.3 mLs (150 mcg total) into the vein every Friday with hemodialysis. 02/11/17  Yes Dana Allan I, MD  gabapentin (NEURONTIN) 100 MG capsule Take 200 mg by mouth every evening. 09/27/18  Yes [provider]  heparin 1000 UNIT/ML injection Inject 4 mLs into the vein See admin instructions. Every hemodialysis treatment 08/14/18 08/13/19 Yes [provider]  hydrOXYzine (ATARAX/VISTARIL) 50 MG tablet Take 50 mg by mouth 3 (three) times daily as needed for itching.    Yes [provider]  methocarbamol (ROBAXIN) 500 MG tablet Take 1 tablet (500 mg total) by mouth every 8 (eight)  hours as needed for muscle spasms. 10/09/18  Yes Davonna Belling, MD  montelukast (SINGULAIR) 10 MG tablet Take 10 mg by mouth at bedtime as needed. 08/14/18  Yes [provider]  multivitamin (RENA-VIT) TABS tablet Take 1 tablet by mouth at bedtime. 02/08/17  Yes Dana Allan I, MD  sevelamer carbonate (RENVELA) 800 MG tablet Take 24,000-32,000 mg by mouth daily.    Yes [provider]  temazepam (RESTORIL) 30 MG capsule Take 30 mg by mouth at bedtime.    Yes [provider]  dicyclomine (BENTYL) 20 MG tablet Take 1 tablet (20 mg total) by mouth every 6 (six) hours as needed for spasms (abdominal pain). Patient not taking: Reported on 10/23/2018 07/26/18   Virgel Manifold, MD  HYDROcodone-acetaminophen (NORCO/VICODIN) 5-325 MG tablet Take 1 tablet by mouth every 6 (six) hours as needed for moderate pain. Patient not taking: Reported on 10/23/2018 08/04/18 08/04/19  Alma Friendly, MD    Family History Family History  Problem Relation Age of Onset  . Hypertension Mother   . Cancer Mother 43       Ovarian  . Heart disease Maternal Aunt   . Stroke Maternal Grandfather     Social History Social History   Tobacco Use  . Smoking status: Current Some Day Smoker    Packs/day: 0.12    Years: 32.00    Pack years: 3.84    Types: Cigarettes    Last attempt to quit: 10/05/2016    Years since quitting: 2.0  . Smokeless tobacco: Never Used  . Tobacco comment: 1 per week  Substance Use Topics  . Alcohol use: No    Alcohol/week: 0.0 standard drinks  . Drug use: No     Allergies   Morphine and related, Tygacil [tigecycline], and Imodium [loperamide]   Review of Systems Review of Systems  Constitutional: Positive for chills and fatigue. Negative for fever.  Respiratory: Positive for cough and shortness of breath.   Cardiovascular: Positive for chest pain. Negative for palpitations.  Musculoskeletal: Positive for myalgias.  All other systems reviewed and  are negative.    Physical Exam Updated Vital Signs BP (!) 160/96  Pulse 85   Temp 97.6 F (36.4 C) (Oral)   Resp 15   SpO2 96%   Physical Exam Vitals signs and nursing note reviewed.  Constitutional:      Appearance: He is well-developed.  HENT:     Head: Normocephalic and atraumatic.  Eyes:     Conjunctiva/sclera: Conjunctivae normal.  Neck:     Musculoskeletal: Neck supple.  Cardiovascular:     Rate and Rhythm: Normal rate and regular rhythm.  Pulmonary:     Effort: Pulmonary effort is normal. No respiratory distress.     Breath sounds: Examination of the right-lower field reveals rales. Examination of the left-lower field reveals rales. Rales present.  Abdominal:     Palpations: Abdomen is soft.     Tenderness: There is abdominal tenderness.     Hernia: A hernia is present.  Genitourinary:    Comments: Bilateral inguinal hernias present. Musculoskeletal:     Comments: LLE AKA with a prosthetic leg  Skin:    General: Skin is warm and dry.  Neurological:     Mental Status: He is alert.      ED Treatments / Results  Labs (all labs ordered are listed, but only abnormal results are displayed) Labs Reviewed  BASIC METABOLIC PANEL - Abnormal; Notable for the following components:      Result Value   Potassium 5.2 (*)    Chloride 97 (*)    Glucose, Bld 117 (*)    BUN 53 (*)    Creatinine, Ser 6.46 (*)    GFR calc non Af Amer 9 (*)    GFR calc Af Amer 10 (*)    Anion gap 16 (*)    All other components within normal limits  CBC - Abnormal; Notable for the following components:   RBC 3.76 (*)    Hemoglobin 11.6 (*)    HCT 35.7 (*)    RDW 16.2 (*)    Platelets 148 (*)    All other components within normal limits  TROPONIN I (HIGH SENSITIVITY) - Abnormal; Notable for the following components:   Troponin I (High Sensitivity) 24 (*)    All other components within normal limits  TROPONIN I (HIGH SENSITIVITY) - Abnormal; Notable for the following components:    Troponin I (High Sensitivity) 22 (*)    All other components within normal limits  SARS CORONAVIRUS 2 BY RT PCR (HOSPITAL ORDER, [facility])    EKG EKG Interpretation  Date/Time:  Monday October 23 2018 12:40:20 EDT Ventricular Rate:  82 PR Interval:  224 QRS Duration: 78 QT Interval:  412 QTC Calculation: 481 R Axis:   83 Text Interpretation:  Sinus rhythm with 1st degree A-V block Possible Left atrial enlargement Prolonged QT Abnormal ECG No significant change since last tracing Confirmed by Deno Etienne [phone]) on 10/23/2018 3:37:20 PM   Radiology Dg Chest 2 View  Result Date: 10/23/2018 CLINICAL DATA:  Left-sided chest pain, sore throat and loss of taste on Friday. EXAM: CHEST - 2 VIEW COMPARISON:  Chest x-ray of 07/09/2018 FINDINGS: Minimal atelectasis at the lung bases. Heart size enlarged as before with changes of median sternotomy. Central pulmonary vasculature is engorged. No signs of pleural effusion. No frank edema. No acute bone finding with changes of cervical discectomy infusion incidentally noted. Vascular calcifications along the left neck. IMPRESSION: Atelectasis and cardiomegaly, no acute cardiopulmonary disease. Electronically Signed   By: Zetta Bills M.D.   On: 10/23/2018 13:47    Procedures Procedures (  including critical care time)  Medications Ordered in ED Medications  sodium chloride flush (NS) 0.9 % injection 3 mL (has no administration in time range)  sodium zirconium cyclosilicate (LOKELMA) packet 5 g (5 g Oral Given 10/23/18 1817)     Initial Impression / Assessment and Plan / ED Course  I have reviewed the triage vital signs and the nursing notes.  Pertinent labs & imaging results that were available during my care of the patient were reviewed by me and considered in my medical decision making (see chart for details).  Clinical Course as of Oct 22 2053  Mon Oct 23, 2018  1555 Troponin I (High Sensitivity)(!): 22 [JL]     Clinical Course User Index [JL] Regan Lemming, MD       The patient is a 58 y.o. male with past medical history of CAD s/p CABG in May 2018, DM 2, ESRD on HD, HTN, CVA 2005, tobacco abuse, PAD and peripheral neuropathy who presents for a COVID test.   The patient's COVID test resulted negative. He may have a upper respiratory viral infection. He is currently cleared for dialysis and does not require emergent inpatient dialysis. Potassium was mildly elevated at 5.2 but he had no EKG changes. His initial troponin was 24 and was downtrending to 22, likely sequelae of ESRD and volume overload. He was subsequently discharged with a plan to go to dialysis outpatient.   Final Clinical Impressions(s) / ED Diagnoses   Final diagnoses:  Acute viral syndrome  At risk for fluid volume overload    ED Discharge Orders    None       Regan Lemming, MD 10/23/18 2055    Deno Etienne, DO 10/23/18 2124

## 2018-10-23 NOTE — Discharge Instructions (Addendum)
Your COVID swab was negative. Your potassium was slightly elevated at 5.2. You are cleared to go to dialysis outpatient. You likely have a viral illness.

## 2018-10-23 NOTE — ED Notes (Signed)
Patient verbalizes understanding of discharge instructions. Opportunity for questioning and answers were provided. Armband removed by staff, pt discharged from ED.  

## 2018-10-31 ENCOUNTER — Ambulatory Visit (INDEPENDENT_AMBULATORY_CARE_PROVIDER_SITE_OTHER): Payer: Medicare HMO

## 2018-10-31 ENCOUNTER — Encounter: Payer: Self-pay | Admitting: Family

## 2018-10-31 ENCOUNTER — Ambulatory Visit (INDEPENDENT_AMBULATORY_CARE_PROVIDER_SITE_OTHER): Payer: Medicare HMO | Admitting: Family

## 2018-10-31 ENCOUNTER — Other Ambulatory Visit: Payer: Self-pay

## 2018-10-31 VITALS — Ht 71.0 in | Wt 143.0 lb

## 2018-10-31 DIAGNOSIS — L97321 Non-pressure chronic ulcer of left ankle limited to breakdown of skin: Secondary | ICD-10-CM | POA: Diagnosis not present

## 2018-10-31 DIAGNOSIS — M79671 Pain in right foot: Secondary | ICD-10-CM | POA: Diagnosis not present

## 2018-10-31 NOTE — Progress Notes (Signed)
Office Visit Note   Patient: Matthew Kline           Date of Birth: 1960-09-23           MRN: [id number] Visit Date: 10/31/2018              Requested by: [facility] [place] [address] [place],  [place] [id number] PCP: [facility]  Chief Complaint  Patient presents with  . Right Foot - Pain, Wound Check      HPI: The patient is a 58 year old gentleman seen today for evaluation of this new issue with blisters to his right foot and ankle.  He states that he dropped a jar on his foot and this caused an ulcer to his anterior ankle.  Subsequently he was wearing some compression garments that were rolled down in gathered over the anterior ankle over the ulcered area.  States he also had been covered with scab but now he has some darkened discoloration to the distal aspect he is concerned for infection or gangrene.  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 Right foot pain   2. Skin ulcer of left ankle, limited to breakdown of skin ([place])     Plan: Reassurance provided.  Radiographs reassuring.  We will continue with conservative measures.  Mupirocin dressing changes over the anterior ankle discussed using compression garments as well he does have some mild peripheral edema.  No sign of infection.  Follow-Up Instructions: Return in about 2 weeks (around 11/14/2018).   Ortho Exam  Patient is alert, oriented, no adenopathy, well-dressed, normal affect, normal respiratory effort. On examination of the right lower extremity he does have 1+ pitting edema there is no weeping there are some scattered scabbed ulcerations to his leg.  Over his anterior ankle there is a 3 cm in diameter ulceration this is 90% filled in with fibrinous tissue.  There is necrotic tissue distally.  There is no active drainage there is no granulation there is no surrounding erythema  no warmth no odor no sign of infection  Does have a biphasic pulse with doppler.  Imaging: No results found. No images are attached to the encounter.  Labs: Lab Results  Component Value Date   HGBA1C 5.7 (H) 10/03/2018   HGBA1C 5.7 (H) 08/01/2018   HGBA1C 5.3 07/10/2018   ESRSEDRATE 80 (H) 11/29/2015   ESRSEDRATE 75 (H) 08/07/2014   ESRSEDRATE >140 (H) 06/03/2014   CRP 9.8 (H) 07/14/2016   CRP 20.4 (H) 11/29/2015   CRP 5.6 (H) 08/07/2014   REPTSTATUS 08/06/2018 FINAL 08/01/2018   GRAMSTAIN  01/12/2017    RARE WBC PRESENT, PREDOMINANTLY MONONUCLEAR FEW GRAM POSITIVE COCCI IN PAIRS    CULT  08/01/2018    NO GROWTH 5 DAYS Performed at [facility], [place] [address] [place], [place] [address]    [place] 01/12/2017     Lab Results  Component Value Date   ALBUMIN 3.6 10/09/2018   ALBUMIN 3.1 (L) 08/02/2018   ALBUMIN 3.5 08/01/2018    Lab Results  Component Value Date   MG 2.1 08/02/2018   MG 2.2 08/01/2018   MG 1.9 01/10/2012   No results found for: VD25OH  No results found for: PREALBUMIN CBC EXTENDED Latest Ref Rng & Units 10/23/2018 10/09/2018 10/03/2018  WBC 4.0 - 10.5 K/uL 6.0 5.7 5.7  RBC 4.22 - 5.81  MIL/uL 3.76(L) 3.79(L) 3.71(L)  HGB 13.0 - 17.0 g/dL 11.6(L) 11.4(L) 11.2(L)  HCT 39.0 - 52.0 % 35.7(L) 36.2(L) 35.6(L)  PLT 150 - 400 K/uL 148(L) 153 163  NEUTROABS 1.7 - 7.7 K/uL - - -  LYMPHSABS 0.7 - 4.0 K/uL - - -     Body mass index is 19.94 kg/m.  Orders:  Orders Placed This Encounter  Procedures  . XR Foot 2 Views Right   No orders of the defined types were placed in this encounter.    Procedures: No procedures performed  Clinical Data: No additional findings.  ROS:  All other systems negative, except as noted in the HPI. Review of Systems  Constitutional: Negative for chills and fever.  Cardiovascular: Positive for leg swelling.  Skin: Positive for color change and wound.    Objective: Vital Signs: Ht  5\' 11"  (1.803 m)   Wt 143 lb (64.9 kg)   BMI 19.94 kg/m   Specialty Comments:  No specialty comments available.  PMFS History: Patient Active Problem List   Diagnosis Date Noted  . Biliary colic 45/40/9811  . Acute cholecystitis 07/31/2018  . Prolonged QT interval 07/31/2018  . Acute respiratory failure with hypoxia ([place]) 07/10/2018  . Abdominal pain 07/10/2018  . Bilateral recurrent inguinal hernia without obstruction or gangrene   . Altered mental status   . Cerebral thrombosis with cerebral infarction 02/06/2017  . Cerebral embolism with cerebral infarction 02/06/2017  . Hx of AKA (above knee amputation), left ([place])   . Pressure injury of skin 01/30/2017  . Acute lower UTI 01/29/2017  . Acute metabolic encephalopathy 91/47/8295  . UTI (urinary tract infection) 01/29/2017  . Quadriceps muscle rupture, left, initial encounter   . Fall 01/09/2017  . Gait disturbance 01/09/2017  . Staphylococcus aureus infection 01/09/2017  . Infection of prosthetic left knee joint ([place]) 01/09/2017  . Fever, unknown origin 11/09/2016  . Critical lower limb ischemia 08/25/2016  . Ulcer of left midfoot with fat layer exposed ([place]) 08/13/2016  . Diabetic ulcer of left midfoot associated with type 2 diabetes mellitus, with fat layer exposed ([place]) 07/25/2016  . Peripheral neuropathy 07/22/2016  . Tobacco abuse 07/22/2016  . CAD in native artery 05/18/2016  . CAD, multiple vessel 05/11/2016  . Positive cardiac stress test 05/11/2016  . Abnormal stress test 04/30/2016  . Pre-transplant evaluation for kidney transplant 04/30/2016  . S/P revision of total knee 11/26/2015  . Pain in the chest   . Acute on chronic diastolic heart failure ([place]) 07/05/2015  . Volume overload 07/04/2015  . Shortness of breath 07/04/2015  . Hypoxemia 07/04/2015  . Elevated troponin   . End-stage renal disease on hemodialysis ([place])   . Hypervolemia   . 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 Pyogenic  bacterial arthritis of knee, left ([place]) 08/07/2014  . Tachycardia 07/24/2014  . Acute upper respiratory infection 07/24/2014  . ESRD on dialysis ([place]) 07/14/2014  . Type II diabetes mellitus ([place]) 07/14/2014  . Anemia in chronic kidney disease 07/14/2014  . Congestive heart failure (CHF) ([place]) 07/13/2014  . Surgical wound dehiscence 05/09/2014  . Dehiscence of closure of skin 05/09/2014  . Total knee replacement status 04/10/2014  . Diabetes mellitus with renal manifestations, controlled ([place]) 10/24/2013  . Hypertensive renal disease 06/27/2013  . DM type 2 causing vascular disease ([place]) 06/27/2013  . Erectile dysfunction 06/27/2013  . Depression 06/27/2013  . Claudication of left lower extremity ([place]) 12/19/2012  . Essential hypertension, benign 12/19/2012  . Sinusitis, acute maxillary  11/22/2012  . Otitis, externa, infective 11/14/2012  . Leg edema, left 11/14/2012  . End stage renal disease ([place]) 10/02/2012  . Controlled type 2 DM with proteinuria or microalbuminuria 09/19/2012  . GERD (gastroesophageal reflux disease) 09/19/2012  . Leukocytosis 09/19/2012  . Lacunar infarction ([place]) 08/17/2012  . Polymyalgia rheumatica ([place]) 08/17/2012  . 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 Essential hypertension 05/10/2012  . Vitamin D deficiency 05/10/2012  . Diabetes mellitus due to underlying condition ([place]) 05/10/2012  . Hyperlipidemia LDL goal <100 05/10/2012  . Anemia of chronic disease 05/10/2012  . Screening for prostate cancer 05/10/2012  . Chronic kidney disease (CKD), stage IV (severe) ([place]) 05/10/2012  . Peripheral autonomic neuropathy due to DM ([place]) 05/10/2012  . Callus of foot 05/10/2012  . Urgency of urination 05/10/2012  . Hyperkalemia 05/10/2012  . Candidiasis of the esophagus 10/12/2011  . Internal hemorrhoids without mention of complication 10/93/2355  . Pre-syncope 07/25/2009  . DJD (degenerative joint disease) of cervical spine 02/17/2009   Past Medical History:   Diagnosis Date  . Anemia, unspecified   . Anxiety   . Arthralgia 2010   polyarticular  . Arthritis    "back, knees" (01/10/2017)  . Cancer [facility])    "kidney area" (01/10/2017)  . CHF (congestive heart failure) ([place]) 07/25/2009   denies  . Chronic lower back pain   . Coronary artery disease   . Coughing    pt. reports that he has drainage from sinus infection  . Diabetic foot ulcer ([place])   . Diabetic neuropathy ([place])   . ESRD (end stage renal disease) on dialysis [facility])    started 12/2012; "MWF; [facility] "  (01/10/2017)  . GERD (gastroesophageal reflux disease)    hx "before I lost weight", no problem 9 years  . Hemodialysis access site with mature fistula ([place])   . Hemorrhoids, internal 10/2011   small  . High cholesterol   . History of blood transfusion    "related to the anemia"  . Hypertension   . Insomnia, unspecified   . Lacunar infarction ([place]) 2006   RUE/RLE, speech  . Long term (current) use of anticoagulants   . Myocardial infarction ([place]) 1995  . Orthostatic hypotension   . Osteomyelitis of foot, left, acute ([place])   . Other chronic postoperative pain   . Pneumonia    "probably twice" (01/10/2017)  . Polymyalgia rheumatica ([place])   . Renal insufficiency   . Sleep apnea    "lost weight; no more problem" (01/10/2017)  . Stroke ([place]) 01/10/06   denies residual on 05/09/2014  . Type II diabetes mellitus ([place]) dx'd 1995  . Unspecified hereditary and idiopathic peripheral neuropathy    feet  . Unspecified osteomyelitis, site unspecified   . Unspecified vitamin D deficiency     Family History  Problem Relation Age of Onset  . Hypertension Mother   . Cancer Mother 39       Ovarian  . Heart disease Maternal Aunt   . Stroke Maternal Grandfather     Past Surgical History:  Procedure Laterality Date  . ABDOMINAL AORTOGRAM N/A 08/25/2016   Procedure: ABDOMINAL AORTOGRAM;  Surgeon: Wellington Hampshire, MD;  Location: [place] [facility];  Service:  Cardiovascular;  Laterality: N/A;  . AMPUTATION  01/21/2012   Procedure: AMPUTATION RAY;  Surgeon: [name], MD;  Location: [place];  Service: Orthopedics;  Laterality: Left;  Left Foot 4th Ray Amputation  . AMPUTATION Left 05/04/2013   Procedure: AMPUTATION DIGIT;  Surgeon: [name], MD;  Location: [place];  Service: Orthopedics;  Laterality: Left;  Left Great Toe Amputation at [facility]TP  . AMPUTATION Left 01/14/2017   Procedure: AMPUTATION ABOVE LEFT KNEE;  Surgeon: [name], MD;  Location: [place];  Service: Orthopedics;  Laterality: Left;  . ANTERIOR CERVICAL DECOMP/DISCECTOMY FUSION  02/2011  . BACK SURGERY    . BASCILIC VEIN TRANSPOSITION Left 10/19/2012   Procedure: BASCILIC VEIN TRANSPOSITION;  Surgeon: Serafina Mitchell, MD;  Location: [facility];  Service: Vascular;  Laterality: Left;  . CARDIAC CATHETERIZATION     "before bypass"  . CHOLECYSTECTOMY N/A 08/02/2018   Procedure: LAPAROSCOPIC CHOLECYSTECTOMY WITH INTRAOPERATIVE CHOLANGIOGRAM;  Surgeon: Donnie Mesa, MD;  Location: [place];  Service: General;  Laterality: N/A;  . CORONARY ARTERY BYPASS GRAFT     x 5 with lima at [place] SPACERS Left 08/07/2014   Procedure: Replace Left Total Knee Arthroplasty,  Place Antibiotic Spacer;  Surgeon: [name], MD;  Location: [place];  Service: Orthopedics;  Laterality: Left;  . I&D EXTREMITY Left 05/09/2014   Procedure: Irrigation and Debridement Left Knee and Closure of Total Knee Arthroplasty Incision;  Surgeon: [name], MD;  Location: [place];  Service: Orthopedics;  Laterality: Left;  . I&D KNEE WITH POLY EXCHANGE Left 05/31/2014   Procedure: IRRIGATION AND DEBRIDEMENT LEFT KNEE, PLACE ANTIBIOTIC BEADS,  POLY EXCHANGE;  Surgeon: [name], MD;  Location: [place];  Service: Orthopedics;  Laterality: Left;  . IRRIGATION AND DEBRIDEMENT KNEE Left 01/12/2017   Procedure: IRRIGATION AND DEBRIDEMENT LEFT KNEE;  Surgeon: [name], MD;   Location: [place];  Service: Orthopedics;  Laterality: Left;  . JOINT REPLACEMENT    . KNEE ARTHROSCOPY Left 08-25-2012  . LOWER EXTREMITY ANGIOGRAPHY Left 08/25/2016   Procedure: Lower Extremity Angiography;  Surgeon: Wellington Hampshire, MD;  Location: [place] [facility];  Service: Cardiovascular;  Laterality: Left;  . PERIPHERAL VASCULAR BALLOON ANGIOPLASTY Left 08/25/2016   Procedure: PERIPHERAL VASCULAR BALLOON ANGIOPLASTY;  Surgeon: Wellington Hampshire, MD;  Location: [place] [facility];  Service: Cardiovascular;  Laterality: Left;  lt peroneal and ant tibial arteries cutting balloon  . REFRACTIVE SURGERY Bilateral   . TOE AMPUTATION Bilateral    "I've lost 7 toes over the last 7 years" (05/09/2014)  . TOE SURGERY Left April 2015   Big toe removed on left foot.  . TONSILLECTOMY    . TOTAL KNEE ARTHROPLASTY Left 04/10/2014   Procedure: TOTAL KNEE ARTHROPLASTY;  Surgeon: [name], MD;  Location: [place];  Service: Orthopedics;  Laterality: Left;  . TOTAL KNEE REVISION Left 10/25/2014   Procedure: LEFT TOTAL KNEE REVISION;  Surgeon: [name], MD;  Location: [place];  Service: Orthopedics;  Laterality: Left;  . TOTAL KNEE REVISION Left 11/26/2015   Procedure: Removal Left Total Knee Arthroplasty, Hinged Total Knee Arthroplasty;  Surgeon: [name], MD;  Location: [place];  Service: Orthopedics;  Laterality: Left;  . UVULOPALATOPHARYNGOPLASTY, TONSILLECTOMY AND SEPTOPLASTY  ~ 1989  . WOUND DEBRIDEMENT Left 05/09/2014   Dehiscence Left Total Knee Arthroplasty Incision   Social History   Occupational History  . Not on file  Tobacco Use  . Smoking status: Current Some Day Smoker    Packs/day: 0.12    Years: 32.00    Pack years: 3.84    Types: Cigarettes    Last attempt to quit: 10/05/2016    Years since  quitting: 2.0  . Smokeless tobacco: Never Used  . Tobacco comment: 1 per week  Substance and Sexual Activity  . Alcohol use: No    Alcohol/week: 0.0 standard drinks  . Drug use: No   . Sexual activity: Not Currently

## 2018-11-14 ENCOUNTER — Encounter: Payer: Self-pay | Admitting: Family

## 2018-11-14 ENCOUNTER — Ambulatory Visit (INDEPENDENT_AMBULATORY_CARE_PROVIDER_SITE_OTHER): Payer: Medicare HMO | Admitting: Family

## 2018-11-14 ENCOUNTER — Other Ambulatory Visit: Payer: Self-pay

## 2018-11-14 VITALS — Ht 71.0 in | Wt 143.0 lb

## 2018-11-14 DIAGNOSIS — L97321 Non-pressure chronic ulcer of left ankle limited to breakdown of skin: Secondary | ICD-10-CM | POA: Diagnosis not present

## 2018-11-14 NOTE — Progress Notes (Signed)
Office Visit Note   Patient: Matthew Kline           Date of Birth: 05-16-1960           MRN: [id number] Visit Date: 11/14/2018              Requested by: [facility], [place] [address] [place],  [place] [id number] PCP: [place]  Chief Complaint  Patient presents with  . Right Ankle - Follow-up      HPI: The patient is a 58 year old gentleman seen today in follow up for evaluation of ulcers to his right foot and ankle. Has been using a Vive compression garment with direct skin contact. Feels things are improving.   He states that he dropped a jar on his foot and this caused an ulcer to his anterior ankle.  Subsequently he was wearing some compression garments that were rolled down in gathered over the anterior ankle over the ulcered area.    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 Skin ulcer of left ankle, limited to breakdown of skin ([place])     Plan: continue compression garments with direct skin contact. Call for any concern of worsening.   Follow-Up Instructions: Return in about 3 weeks (around 12/05/2018).   Ortho Exam  Patient is alert, oriented, no adenopathy, well-dressed, normal affect, normal respiratory effort. On examination of the right lower extremity he does have 1+ pitting edema there is no weeping there are some scattered scabbed ulcerations to his leg.  Over his anterior ankle there is a 2.5 cm in diameter ulceration this is 100% eschar no surrounding erythema, drainage or maceration. no warmth no odor no sign of infection  Does have a biphasic pulse with doppler.  Imaging: No results found. No images are attached to the encounter.  Labs: Lab Results  Component Value Date   HGBA1C 5.7 (H) 10/03/2018   HGBA1C 5.7 (H) 08/01/2018   HGBA1C 5.3 07/10/2018   ESRSEDRATE 80 (H) 11/29/2015   ESRSEDRATE 75 (H) 08/07/2014   ESRSEDRATE >140 (H) 06/03/2014    CRP 9.8 (H) 07/14/2016   CRP 20.4 (H) 11/29/2015   CRP 5.6 (H) 08/07/2014   REPTSTATUS 08/06/2018 FINAL 08/01/2018   GRAMSTAIN  01/12/2017    RARE WBC PRESENT, PREDOMINANTLY MONONUCLEAR FEW GRAM POSITIVE COCCI IN PAIRS    CULT  08/01/2018    NO GROWTH 5 DAYS Performed at [facility], [place] [address] [place], [place] [address]    LABORGA STAPHYLOCOCCUS AUREUS 01/12/2017     Lab Results  Component Value Date   ALBUMIN 3.6 10/09/2018   ALBUMIN 3.1 (L) 08/02/2018   ALBUMIN 3.5 08/01/2018    Lab Results  Component Value Date   MG 2.1 08/02/2018   MG 2.2 08/01/2018   MG 1.9 01/10/2012   No results found for: VD25OH  No results found for: PREALBUMIN CBC EXTENDED Latest Ref Rng & Units 10/23/2018 10/09/2018 10/03/2018  WBC 4.0 - 10.5 K/uL 6.0 5.7 5.7  RBC 4.22 - 5.81 MIL/uL 3.76(L) 3.79(L) 3.71(L)  HGB 13.0 - 17.0 g/dL 11.6(L) 11.4(L) 11.2(L)  HCT 39.0 - 52.0 % 35.7(L) 36.2(L) 35.6(L)  PLT 150 - 400 K/uL 148(L) 153 163  NEUTROABS 1.7 - 7.7 K/uL - - -  LYMPHSABS 0.7 - 4.0 K/uL - - -     Body mass index is 19.94 kg/m.  Orders:  No orders of the defined types were  placed in this encounter.  No orders of the defined types were placed in this encounter.    Procedures: No procedures performed  Clinical Data: No additional findings.  ROS:  All other systems negative, except as noted in the HPI. Review of Systems  Constitutional: Negative for chills and fever.  Cardiovascular: Positive for leg swelling.  Skin: Positive for color change and wound.    Objective: Vital Signs: Ht 5\' 11"  (1.803 m)   Wt 143 lb (64.9 kg)   BMI 19.94 kg/m   Specialty Comments:  No specialty comments available.  PMFS History: Patient Active Problem List   Diagnosis Date Noted  . Biliary colic 23/30/0762  . Acute cholecystitis 07/31/2018  . Prolonged QT interval 07/31/2018  . Acute respiratory failure with hypoxia ([place]) 07/10/2018  . Abdominal pain 07/10/2018  .  Bilateral recurrent inguinal hernia without obstruction or gangrene   . Altered mental status   . Cerebral thrombosis with cerebral infarction 02/06/2017  . Cerebral embolism with cerebral infarction 02/06/2017  . Hx of AKA (above knee amputation), left ([place])   . Pressure injury of skin 01/30/2017  . Acute lower UTI 01/29/2017  . Acute metabolic encephalopathy 26/33/3545  . UTI (urinary tract infection) 01/29/2017  . Quadriceps muscle rupture, left, initial encounter   . Fall 01/09/2017  . Gait disturbance 01/09/2017  . Staphylococcus aureus infection 01/09/2017  . Infection of prosthetic left knee joint ([place]) 01/09/2017  . Fever, unknown origin 11/09/2016  . Critical lower limb ischemia 08/25/2016  . Ulcer of left midfoot with fat layer exposed ([place]) 08/13/2016  . Diabetic ulcer of left midfoot associated with type 2 diabetes mellitus, with fat layer exposed ([place]) 07/25/2016  . Peripheral neuropathy 07/22/2016  . Tobacco abuse 07/22/2016  . CAD in native artery 05/18/2016  . CAD, multiple vessel 05/11/2016  . Positive cardiac stress test 05/11/2016  . Abnormal stress test 04/30/2016  . Pre-transplant evaluation for kidney transplant 04/30/2016  . S/P revision of total knee 11/26/2015  . Pain in the chest   . Acute on chronic diastolic heart failure ([place]) 07/05/2015  . Volume overload 07/04/2015  . Shortness of breath 07/04/2015  . Hypoxemia 07/04/2015  . Elevated troponin   . End-stage renal disease on hemodialysis ([place])   . Hypervolemia   . 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 Pyogenic bacterial arthritis of knee, left ([place]) 08/07/2014  . Tachycardia 07/24/2014  . Acute upper respiratory infection 07/24/2014  . ESRD on dialysis ([place]) 07/14/2014  . Type II diabetes mellitus ([place]) 07/14/2014  . Anemia in chronic kidney disease 07/14/2014  . Congestive heart failure (CHF) ([place]) 07/13/2014  . Surgical wound dehiscence 05/09/2014  . Dehiscence of closure of  skin 05/09/2014  . Total knee replacement status 04/10/2014  . Diabetes mellitus with renal manifestations, controlled ([place]) 10/24/2013  . Hypertensive renal disease 06/27/2013  . DM type 2 causing vascular disease ([place]) 06/27/2013  . Erectile dysfunction 06/27/2013  . Depression 06/27/2013  . Claudication of left lower extremity ([place]) 12/19/2012  . Essential hypertension, benign 12/19/2012  . Sinusitis, acute maxillary 11/22/2012  . Otitis, externa, infective 11/14/2012  . Leg edema, left 11/14/2012  . End stage renal disease ([place]) 10/02/2012  . Controlled type 2 DM with proteinuria or microalbuminuria 09/19/2012  . GERD (gastroesophageal reflux disease) 09/19/2012  . Leukocytosis 09/19/2012  . Lacunar infarction ([place]) 08/17/2012  . Polymyalgia rheumatica ([place]) 08/17/2012  . 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 Essential hypertension 05/10/2012  . Vitamin D deficiency 05/10/2012  .  Diabetes mellitus due to underlying condition ([place]) 05/10/2012  . Hyperlipidemia LDL goal <100 05/10/2012  . Anemia of chronic disease 05/10/2012  . Screening for prostate cancer 05/10/2012  . Chronic kidney disease (CKD), stage IV (severe) ([place]) 05/10/2012  . Peripheral autonomic neuropathy due to DM ([place]) 05/10/2012  . Callus of foot 05/10/2012  . Urgency of urination 05/10/2012  . Hyperkalemia 05/10/2012  . Candidiasis of the esophagus 10/12/2011  . Internal hemorrhoids without mention of complication 74/12/8784  . Pre-syncope 07/25/2009  . DJD (degenerative joint disease) of cervical spine 02/17/2009   Past Medical History:  Diagnosis Date  . Anemia, unspecified   . Anxiety   . Arthralgia 2010   polyarticular  . Arthritis    "back, knees" (01/10/2017)  . Cancer [facility])    "kidney area" (01/10/2017)  . CHF (congestive heart failure) ([place]) 07/25/2009   denies  . Chronic lower back pain   . Coronary artery disease   . Coughing    pt. reports that he has drainage from sinus infection  .  Diabetic foot ulcer ([place])   . Diabetic neuropathy ([place])   . ESRD (end stage renal disease) on dialysis [facility])    started 12/2012; "MWF; [facility] "  (01/10/2017)  . GERD (gastroesophageal reflux disease)    hx "before I lost weight", no problem 9 years  . Hemodialysis access site with mature fistula ([place])   . Hemorrhoids, internal 10/2011   small  . High cholesterol   . History of blood transfusion    "related to the anemia"  . Hypertension   . Insomnia, unspecified   . Lacunar infarction ([place]) 2006   RUE/RLE, speech  . Long term (current) use of anticoagulants   . Myocardial infarction ([place]) 1995  . Orthostatic hypotension   . Osteomyelitis of foot, left, acute ([place])   . Other chronic postoperative pain   . Pneumonia    "probably twice" (01/10/2017)  . Polymyalgia rheumatica ([place])   . Renal insufficiency   . Sleep apnea    "lost weight; no more problem" (01/10/2017)  . Stroke ([place]) 01/10/06   denies residual on 05/09/2014  . Type II diabetes mellitus ([place]) dx'd 1995  . Unspecified hereditary and idiopathic peripheral neuropathy    feet  . Unspecified osteomyelitis, site unspecified   . Unspecified vitamin D deficiency     Family History  Problem Relation Age of Onset  . Hypertension Mother   . Cancer Mother 39       Ovarian  . Heart disease Maternal Aunt   . Stroke Maternal Grandfather     Past Surgical History:  Procedure Laterality Date  . ABDOMINAL AORTOGRAM N/A 08/25/2016   Procedure: ABDOMINAL AORTOGRAM;  Surgeon: Wellington Hampshire, MD;  Location: [facility];  Service: Cardiovascular;  Laterality: N/A;  . AMPUTATION  01/21/2012   Procedure: AMPUTATION RAY;  Surgeon: [name], MD;  Location: [facility];  Service: Orthopedics;  Laterality: Left;  Left Foot 4th Ray Amputation  . AMPUTATION Left 05/04/2013   Procedure: AMPUTATION DIGIT;  Surgeon: [name], MD;  Location: [facility];  Service: Orthopedics;  Laterality: Left;  Left Great Toe Amputation at  [facility]  . AMPUTATION Left 01/14/2017   Procedure: AMPUTATION ABOVE LEFT KNEE;  Surgeon: [name], MD;  Location: [facility];  Service: Orthopedics;  Laterality: Left;  . ANTERIOR CERVICAL DECOMP/DISCECTOMY FUSION  02/2011  . BACK SURGERY    . BASCILIC VEIN TRANSPOSITION Left 10/19/2012   Procedure:  BASCILIC VEIN TRANSPOSITION;  Surgeon: Serafina Mitchell, MD;  Location: [facility];  Service: Vascular;  Laterality: Left;  . CARDIAC CATHETERIZATION     "before bypass"  . CHOLECYSTECTOMY N/A 08/02/2018   Procedure: LAPAROSCOPIC CHOLECYSTECTOMY WITH INTRAOPERATIVE CHOLANGIOGRAM;  Surgeon: Donnie Mesa, MD;  Location: [facility];  Service: General;  Laterality: N/A;  . CORONARY ARTERY BYPASS GRAFT     x 5 with lima at [place] SPACERS Left 08/07/2014   Procedure: Replace Left Total Knee Arthroplasty,  Place Antibiotic Spacer;  Surgeon: [name], MD;  Location: [facility];  Service: Orthopedics;  Laterality: Left;  . I&D EXTREMITY Left 05/09/2014   Procedure: Irrigation and Debridement Left Knee and Closure of Total Knee Arthroplasty Incision;  Surgeon: [name], MD;  Location: [facility];  Service: Orthopedics;  Laterality: Left;  . I&D KNEE WITH POLY EXCHANGE Left 05/31/2014   Procedure: IRRIGATION AND DEBRIDEMENT LEFT KNEE, PLACE ANTIBIOTIC BEADS,  POLY EXCHANGE;  Surgeon: [name], MD;  Location: [facility];  Service: Orthopedics;  Laterality: Left;  . IRRIGATION AND DEBRIDEMENT KNEE Left 01/12/2017   Procedure: IRRIGATION AND DEBRIDEMENT LEFT KNEE;  Surgeon: [name], MD;  Location: [facility];  Service: Orthopedics;  Laterality: Left;  . JOINT REPLACEMENT    . KNEE ARTHROSCOPY Left 08-25-2012  . LOWER EXTREMITY ANGIOGRAPHY Left 08/25/2016   Procedure: Lower Extremity Angiography;  Surgeon: Wellington Hampshire, MD;  Location: [facility];  Service: Cardiovascular;  Laterality: Left;  . PERIPHERAL VASCULAR BALLOON ANGIOPLASTY Left 08/25/2016    Procedure: PERIPHERAL VASCULAR BALLOON ANGIOPLASTY;  Surgeon: Wellington Hampshire, MD;  Location: [facility];  Service: Cardiovascular;  Laterality: Left;  lt peroneal and ant tibial arteries cutting balloon  . REFRACTIVE SURGERY Bilateral   . TOE AMPUTATION Bilateral    "I've lost 7 toes over the last 7 years" (05/09/2014)  . TOE SURGERY Left April 2015   Big toe removed on left foot.  . TONSILLECTOMY    . TOTAL KNEE ARTHROPLASTY Left 04/10/2014   Procedure: TOTAL KNEE ARTHROPLASTY;  Surgeon: [name], MD;  Location: [facility];  Service: Orthopedics;  Laterality: Left;  . TOTAL KNEE REVISION Left 10/25/2014   Procedure: LEFT TOTAL KNEE REVISION;  Surgeon: [name], MD;  Location: [facility];  Service: Orthopedics;  Laterality: Left;  . TOTAL KNEE REVISION Left 11/26/2015   Procedure: Removal Left Total Knee Arthroplasty, Hinged Total Knee Arthroplasty;  Surgeon: [name], MD;  Location: [facility];  Service: Orthopedics;  Laterality: Left;  . UVULOPALATOPHARYNGOPLASTY, TONSILLECTOMY AND SEPTOPLASTY  ~ 1989  . WOUND DEBRIDEMENT Left 05/09/2014   Dehiscence Left Total Knee Arthroplasty Incision   Social History   Occupational History  . Not on file  Tobacco Use  . Smoking status: Current Some Day Smoker    Packs/day: 0.12    Years: 32.00    Pack years: 3.84    Types: Cigarettes    Last attempt to quit: 10/05/2016    Years since quitting: 2.1  . Smokeless tobacco: Never Used  . Tobacco comment: 1 per week  Substance and Sexual Activity  . Alcohol use: No    Alcohol/week: 0.0 standard drinks  . Drug use: No  . Sexual activity: Not Currently

## 2018-11-17 ENCOUNTER — Other Ambulatory Visit (HOSPITAL_COMMUNITY)
Admission: RE | Admit: 2018-11-17 | Discharge: 2018-11-17 | Disposition: A | Discharge status: Home / Self Care | Payer: Medicare HMO | Source: Ambulatory Visit | Attending: Surgery | Admitting: Surgery

## 2018-11-17 DIAGNOSIS — Z01812 Encounter for preprocedural laboratory examination: Secondary | ICD-10-CM | POA: Insufficient documentation

## 2018-11-17 DIAGNOSIS — Z20828 Contact with and (suspected) exposure to other viral communicable diseases: Secondary | ICD-10-CM | POA: Diagnosis not present

## 2018-11-18 LAB — NOVEL CORONAVIRUS, NAA (HOSP ORDER, SEND-OUT TO REF LAB; TAT 18-24 HRS): SARS-CoV-2, NAA: NOT DETECTED

## 2018-11-20 ENCOUNTER — Other Ambulatory Visit: Payer: Self-pay

## 2018-11-20 ENCOUNTER — Encounter (HOSPITAL_COMMUNITY): Payer: Self-pay | Admitting: *Deleted

## 2018-11-20 NOTE — Progress Notes (Signed)
Anesthesia Chart Review:  Case: [id number] Date/Time: 11/21/18 1045   Procedure: BILATERAL INGUINAL HERNIA REPAIR WITH MESH (Bilateral )   Anesthesia type: General   Pre-op diagnosis: BILATERAL INGUINAL HERNIA   Location: [place] OR ROOM 08 / [place] OR   Surgeon: Coralie Keens, MD      DISCUSSION: Patient is a 58 year old male scheduled for the above procedure.   History includes smoking, HTN (with history of orthostatic hypotension), DM2 (with neuropathy), OSA (s/p UPPP, tonsillectomy ~ 1989), ESRD (on hemodialysis since 2014; [place];  left basilic vein transposition AVF 10/19/12), polymyalgia rheumatica, HLD, anemia, CAD ("mild" MI '95; s/p CABG: LIMA-LAD, SVG-RCA, SVG-RI 05/18/16 at [facility]), CHF (patient denied; may have been in context of ARF prior to starting HD), CVA (lacunar infarct 2006; 02/06/17), GERD, PAD (s/p previous left and right toe amputations and left great toe amputation 05/04/13; s/p left peroneal and AT arteries angioplasty 08/25/16; 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 On 01/10/17, he also reported history of cancer "kidney area", but otherwise, I don't see mention of this in other provider records in [facility] or [place].   He is s/p CABG in 2018, EF ~ 50% at that time. He was admitted 07/2018 with acute cholecystitis and had preoperative cardiac clearance by Dorris Carnes, MD. However, Hospitalist ordered an echo that showed a newly depressed EF of 25-30% on 08/01/18. He underwent cholecystectomy 08/02/18. He was re-evaluated by Almyra Deforest, PA for preoperative cardiology evaluation for this hernia surgery. Given the newly decreased LVEF, a preoperative stress test was ordered, but 10/17/18 Myoview was indeterminate due to inadequate HR  and BP response to stress. According to 10/19/18 Progress Note by Almyra Deforest, PA, "I discussed the case with Dr. Fletcher Anon who recommended clearance to proceed with surgery as a moderate risk patient given lack of anginal symptom.  Matthew Kline (DOB Apr 21, 1960) would be at acceptable risk for the planned procedure without further cardiovascular testing."    Presurgical COVID-19 test negative on 11/17/18. He is for labs on arrival as he is a same day work-up. Notes indicate that his HD days are MWF. Anesthesia team to evaluate on arrival, but if labs acceptable and otherwise no acute changes then I would anticipate that he can proceed as planned.    VS:  He is for vitals on arrival.  BP Readings from Last 3 Encounters:  10/23/18 (!) 160/96  10/11/18 (!) 154/90  10/09/18 (!) 138/100   Pulse Readings from Last 3 Encounters:  10/23/18 85  10/11/18 84  10/09/18 75    PROVIDERS: [facility] is listed as PCP office Kathlyn Sacramento, MD is cardiologist. Last visit with Almyra Deforest, [place] on 10/19/18.  Pearson Grippe, MD is primary nephrologist   LABS: He will get labs on arrival.    IMAGES: CXR 10/23/18: FINDINGS: - Minimal atelectasis at the lung bases. Heart size enlarged as before with changes of median sternotomy. Central pulmonary vasculature is engorged. No signs of pleural effusion. No frank edema. - No acute bone finding with changes of cervical discectomy infusion incidentally noted. - Vascular calcifications along the left neck. IMPRESSION: Atelectasis and cardiomegaly, no acute cardiopulmonary disease.  MRI brain with MRA head/neck 02/06/17: IMPRESSION: - Subcentimeter acute infarction in the left hippocampus. - Scattered old white matter infarctions. Old left parieto-occipital  cortical and subcortical infarction. - Negative MRA of the head and neck. ADDENDUM: Omitted finding of asymmetric flow related signal in the left transverse and sigmoid dural venous sinus  that is likely from left-sided AV fistula. The left subclavian and lower IJ have arterial flow on neck MRA.   EKG:  EKG 10/23/18:  Sinus rhythm with 1st degree A-V block Possible Left atrial enlargement Prolonged QT  (QT/QTc 412/481 ms)  Abnormal ECG No significant change since last tracing Confirmed by Deno Etienne [phone]) on 10/23/2018 3:37:20 PM   CV: Nuclear stress test 10/17/18:  The left ventricular ejection fraction is moderately decreased (30-44%).  Nuclear stress EF: 30%.  There was no ST segment deviation noted during stress.  Indeterminate study due to inadequate heart rate/blood pressure response to Lexiscan. 1. This is an indeterminate study due to inadequate heart rate/blood pressure response to Lexiscan infusion.  The heart rate did not increase, noted the blood pressure decreased as would be expected for an adequate response to regadenoson. 2. The left ventricle was noted to be dilated with global hypokinesis.  LVEF 30%. 3. A prominent right ventricle was present on rest and stress imaging, suggesting likely RV dysfunction and pulmonary hypertension. 4. There were no perfusion defects detected on this examination, however I am unable to exclude stress perfusion defects due to the limitations noted above.   Echo 08/01/18: IMPRESSIONS  1. The left ventricle has severely reduced systolic function, with an ejection fraction of 25-30%. The cavity size was normal. Left ventricular diastolic function could not be evaluated due to nondiagnostic images. Left ventricular diffuse hypokinesis.  2. Spontaneous echo contrast noted in LV suggestive of low flow.  3. The right ventricle has moderately reduced systolic function. The cavity was severely enlarged. There is no increase in right ventricular wall thickness. Right ventricular systolic pressure is mildly elevated with PASP 31mmHg.  4. Left atrial size was moderately dilated.  5. Right atrial size was moderately dilated.  6.  The aortic valve is tricuspid. Moderate sclerosis of the aortic valve. Moderate aortic annular calcification noted.  7. There is reduced motion of AV cusps but no doppler interrogation of the AV was done.  8. The mitral valve is degenerative. Moderate thickening of the mitral valve leaflet. There is mild mitral annular calcification present.  9. The inferior vena cava was dilated in size with <50% respiratory variability. 10. Recommend limited echo to assess for AS. 11. The aorta is normal in size and structure. (Comparison 01/29/17: LVEF 50-55% with normal wall motion, no regional wall motion abnormalities.    Last cardiac cath was on 05/11/16 pre-CABG (see [place]; done for abnormal stress test 04/29/16 as part of renal transplant work-up) and showed multi-vessel CAD. EF 45-50%. S/p CABG x3 05/18/16 by Lalla Brothers, MD.   Past Medical History:  Diagnosis Date  . Anemia, unspecified   . Anxiety   . Arthralgia 2010   polyarticular  . Arthritis    "back, knees" (01/10/2017)  . Cancer [facility])    "kidney area" (01/10/2017)  . CHF (congestive heart failure) ([place]) 07/25/2009   denies  . Chronic lower back pain   . Coronary artery disease   . Coughing    pt. reports that he has drainage from sinus infection  . Diabetic foot ulcer ([place])   . Diabetic neuropathy ([place])   . ESRD (end stage renal disease) on dialysis [facility])    started 12/2012; "MWF; [place] "  (01/10/2017)  . GERD (gastroesophageal reflux  disease)    hx "before I lost weight", no problem 9 years  . Hemodialysis access site with mature fistula ([place])   . Hemorrhoids, internal 10/2011   small  . High cholesterol   . History of blood transfusion    "related to the anemia"  . Hypertension   . Insomnia, unspecified   . Lacunar infarction ([place]) 2006   RUE/RLE, speech  . Long term (current) use of anticoagulants   . Myocardial infarction ([place]) 1995  . Orthostatic hypotension   . Osteomyelitis of foot, left,  acute ([place])   . Other chronic postoperative pain   . Pneumonia    "probably twice" (01/10/2017)  . Polymyalgia rheumatica ([place])   . Renal insufficiency   . Sleep apnea    "lost weight; no more problem" (01/10/2017)  . Stroke ([place]) 01/10/06   denies residual on 05/09/2014  . Type II diabetes mellitus ([place]) dx'd 1995  . Unspecified hereditary and idiopathic peripheral neuropathy    feet  . Unspecified osteomyelitis, site unspecified   . Unspecified vitamin D deficiency     Past Surgical History:  Procedure Laterality Date  . ABDOMINAL AORTOGRAM N/A 08/25/2016   Procedure: ABDOMINAL AORTOGRAM;  Surgeon: Wellington Hampshire, MD;  Location: [place] CV LAB;  Service: Cardiovascular;  Laterality: N/A;  . AMPUTATION  01/21/2012   Procedure: AMPUTATION RAY;  Surgeon: [name], MD;  Location: [place];  Service: Orthopedics;  Laterality: Left;  Left Foot 4th Ray Amputation  . AMPUTATION Left 05/04/2013   Procedure: AMPUTATION DIGIT;  Surgeon: [name], MD;  Location: [place];  Service: Orthopedics;  Laterality: Left;  Left Great Toe Amputation at [facility]  . AMPUTATION Left 01/14/2017   Procedure: AMPUTATION ABOVE LEFT KNEE;  Surgeon: [name], MD;  Location: [place];  Service: Orthopedics;  Laterality: Left;  . ANTERIOR CERVICAL DECOMP/DISCECTOMY FUSION  02/2011  . BACK SURGERY    . BASCILIC VEIN TRANSPOSITION Left 10/19/2012   Procedure: BASCILIC VEIN TRANSPOSITION;  Surgeon: Serafina Mitchell, MD;  Location: [place];  Service: Vascular;  Laterality: Left;  . CARDIAC CATHETERIZATION     "before bypass"  . CHOLECYSTECTOMY N/A 08/02/2018   Procedure: LAPAROSCOPIC CHOLECYSTECTOMY WITH INTRAOPERATIVE CHOLANGIOGRAM;  Surgeon: Donnie Mesa, MD;  Location: [place] [facility];  Service: General;  Laterality: N/A;  . CORONARY ARTERY BYPASS GRAFT     x 5 with lima at [place] SPACERS Left 08/07/2014   Procedure: Replace Left Total Knee Arthroplasty,  Place  Antibiotic Spacer;  Surgeon: [name], MD;  Location: [place];  Service: Orthopedics;  Laterality: Left;  . I&D EXTREMITY Left 05/09/2014   Procedure: Irrigation and Debridement Left Knee and Closure of Total Knee Arthroplasty Incision;  Surgeon: [name], MD;  Location: [place];  Service: Orthopedics;  Laterality: Left;  . I&D KNEE WITH POLY EXCHANGE Left 05/31/2014   Procedure: IRRIGATION AND DEBRIDEMENT LEFT KNEE, PLACE ANTIBIOTIC BEADS,  POLY EXCHANGE;  Surgeon: [name], MD;  Location: [place];  Service: Orthopedics;  Laterality: Left;  . IRRIGATION AND DEBRIDEMENT KNEE Left 01/12/2017   Procedure: IRRIGATION AND DEBRIDEMENT LEFT KNEE;  Surgeon: [name], MD;  Location: [place];  Service: Orthopedics;  Laterality: Left;  . JOINT REPLACEMENT    . KNEE ARTHROSCOPY Left 08-25-2012  . LOWER EXTREMITY ANGIOGRAPHY Left 08/25/2016   Procedure: Lower Extremity Angiography;  Surgeon: Wellington Hampshire, MD;  Location: [place] CV LAB;  Service: Cardiovascular;  Laterality: Left;  . PERIPHERAL VASCULAR BALLOON ANGIOPLASTY Left 08/25/2016   Procedure: PERIPHERAL VASCULAR BALLOON ANGIOPLASTY;  Surgeon: Wellington Hampshire, MD;  Location: [place] CV LAB;  Service: Cardiovascular;  Laterality: Left;  lt peroneal and ant tibial arteries cutting balloon  . REFRACTIVE SURGERY Bilateral   . TOE AMPUTATION Bilateral    "I've lost 7 toes over the last 7 years" (05/09/2014)  . TOE SURGERY Left April 2015   Big toe removed on left foot.  . TONSILLECTOMY    . TOTAL KNEE ARTHROPLASTY Left 04/10/2014   Procedure: TOTAL KNEE ARTHROPLASTY;  Surgeon: [name], MD;  Location: [place];  Service: Orthopedics;  Laterality: Left;  . TOTAL KNEE REVISION Left 10/25/2014   Procedure: LEFT TOTAL KNEE REVISION;  Surgeon: [name], MD;  Location: [place];  Service: Orthopedics;  Laterality: Left;  . TOTAL KNEE REVISION Left 11/26/2015   Procedure: Removal Left Total Knee Arthroplasty, Hinged Total Knee  Arthroplasty;  Surgeon: [name], MD;  Location: [place];  Service: Orthopedics;  Laterality: Left;  . UVULOPALATOPHARYNGOPLASTY, TONSILLECTOMY AND SEPTOPLASTY  ~ 1989  . WOUND DEBRIDEMENT Left 05/09/2014   Dehiscence Left Total Knee Arthroplasty Incision    MEDICATIONS: No current facility-administered medications for this encounter.    Marland Kitchen acetaminophen (TYLENOL) 500 MG tablet  . amLODipine (NORVASC) 5 MG tablet  . aspirin EC 81 MG EC tablet  . carvedilol (COREG) 12.5 MG tablet  . gabapentin (NEURONTIN) 100 MG capsule  . hydrOXYzine (ATARAX/VISTARIL) 50 MG tablet  . montelukast (SINGULAIR) 10 MG tablet  . multivitamin (RENA-VIT) TABS tablet  . sevelamer carbonate (RENVELA) 800 MG tablet  . temazepam (RESTORIL) 30 MG capsule  . Darbepoetin Alfa (ARANESP) 150 MCG/0.3ML SOSY injection  . heparin 1000 UNIT/ML injection    Myra Gianotti, PA-C Surgical Short Stay/Anesthesiology [facility] Phone [phone] [facility] Phone [phone] 11/20/2018 11:37 AM

## 2018-11-20 NOTE — H&P (Signed)
Matthew Kline  Location: [facility] Patient #: [id number] DOB: Apr 25, 1960 Single / Language: Cleophus Molt / Race: White Male   History of Present Illness  The patient is a 58 year old male who presents with an inguinal hernia. This gentleman is known to our group. He had an urgent cholecystectomy on July 22. He has end-stage renal disease and is on hemodialysis. He was sent to me because of his bilateral inguinal hernias which are increasingly symptomatic and he is requesting repair. He has been recovering well from the gallbladder surgery.   Past Surgical History  Bypass Surgery for Poor Blood Flow to Legs  Dialysis Shunt / Fistula  Foot Surgery  Bilateral.  Allergies  Morphine Sulfate *ANALGESICS - OPIOID*  Tetracycline HCl *TETRACYCLINES*  Allergies Reconciled   Medication History  amLODIPine Besylate (5MG  Tablet, Oral) Active. hydrOXYzine HCl (25MG  Tablet, Oral) Active. Temazepam (30MG  Capsule, Oral) Active. Carvedilol (25MG  Tablet, Oral) Active. Gabapentin (100MG  Capsule, Oral) Active. Montelukast Sodium (10MG  Tablet, Oral) Active. Aspirin (81MG  Tablet, Oral) Active. Renvela (Oral) Specific strength unknown - Active. Medications Reconciled  Social History  Caffeine use  Coffee. No alcohol use  No drug use  Tobacco use  Current some day smoker.  Family History  Cerebrovascular Accident  Father. Heart disease in male family member before age 11  Hypertension  Mother. Ovarian Cancer  Mother.  Other Problems  Anxiety Disorder  Cerebrovascular Accident  Diabetes Mellitus  Transfusion history  Vascular Disease  Ventral Hernia Repair     Review of Systems General Present- Weight Loss. Not Present- Appetite Loss, Chills, Fatigue, Fever, Night Sweats and Weight Gain. Skin Present- Dryness. Not Present- Change in Wart/Mole, Hives, Jaundice, New Lesions, Non-Healing Wounds, Rash and Ulcer. HEENT Present- Seasonal  Allergies. Not Present- Earache, Hearing Loss, Hoarseness, Nose Bleed, Oral Ulcers, Ringing in the Ears, Sinus Pain, Sore Throat, Visual Disturbances, Wears glasses/contact lenses and Yellow Eyes. Respiratory Not Present- Bloody sputum, Chronic Cough, Difficulty Breathing, Snoring and Wheezing. Breast Not Present- Breast Mass, Breast Pain, Nipple Discharge and Skin Changes. Gastrointestinal Present- Abdominal Pain, Change in Bowel Habits, Chronic diarrhea, Gets full quickly at meals and Nausea. Not Present- Bloating, Bloody Stool, Constipation, Difficulty Swallowing, Excessive gas, Hemorrhoids, Indigestion, Rectal Pain and Vomiting. Male Genitourinary Not Present- Blood in Urine, Change in Urinary Stream, Frequency, Impotence, Nocturia, Painful Urination, Urgency and Urine Leakage. Musculoskeletal Present- Joint Pain. Not Present- Back Pain, Joint Stiffness, Muscle Pain, Muscle Weakness and Swelling of Extremities. Neurological Present- Numbness and Trouble walking. Not Present- Decreased Memory, Fainting, Headaches, Seizures, Tingling, Tremor and Weakness. Psychiatric Present- Anxiety. Not Present- Bipolar, Change in Sleep Pattern, Depression, Fearful and Frequent crying. Hematology Present- Gland problems. Not Present- Blood Thinners, Easy Bruising, Excessive bleeding, HIV and Persistent Infections.  Vitals   Weight: 163.8 lb Height: 71in Body Surface Area: 1.94 m Body Mass Index: 22.85 kg/m  Temp.: 86F  Pulse: 88 (Regular)  BP: 126/74 (Sitting, Left Arm, Standard)       Physical Exam The physical exam findings are as follows: Note:Today he appears well. His laparoscopic cholecystectomy incisions are healed. He has large, bilateral inguinal hernias with the right being larger than the left. Lungs clear CV RRR Abdomen otherwise soft, NT/ND Ext normal Skin without rash   Assessment & Plan  BILATERAL INGUINAL HERNIA (K40.20)  Impression: As he is becoming  increasingly symptomatic from the bilateral inguinal hernias, Bilateral inguinal hernia repair with mesh is recommended. I discussed this with him in detail. We discussed the risk in  detail which includes but is not limited to bleeding, infection, the need to convert to an open procedure, nerve entrapment chronic pain, cardiopulmonary issues, etc. He understands and wishes to proceed with surgery.

## 2018-11-20 NOTE — Progress Notes (Signed)
Spoke with pt for pre-op call. Pt had a PAT appt in late September for this same procedure, it was cancelled due to the fact he needed cardiac clearance. Clearance note in Epic under Echo report - "moderate risk for surgery". Chart sent to [place], [place] for review.  Pt denies any recent chest pain or sob. Pt is on dialysis. States he is a type 2 diabetic. Does not check his blood sugar at home. Last A1C was within the last 3 months and he said it was 5.5.  Pt had his Covid test done on 11/17/18 and it is negative. He states he's been in quarantine since except for going to dialysis.

## 2018-11-20 NOTE — Anesthesia Preprocedure Evaluation (Addendum)
Anesthesia Evaluation  Patient identified by MRN, date of birth, ID band Patient awake    Reviewed: Allergy & Precautions, H&P , NPO status , Patient's Chart, lab work & pertinent test results  Airway Mallampati: II   Neck ROM: full    Dental   Pulmonary shortness of breath, sleep apnea , Current Smoker,    breath sounds clear to auscultation       Cardiovascular hypertension, + CAD, + Past MI, + CABG, + Peripheral Vascular Disease and +CHF   Rhythm:regular Rate:Normal  EF 25-30%. Cardiology clearance obtained   Neuro/Psych PSYCHIATRIC DISORDERS Anxiety Depression  Neuromuscular disease CVA    GI/Hepatic GERD  ,  Endo/Other  diabetes, Type 2  Renal/GU ESRF and DialysisRenal disease     Musculoskeletal  (+) Arthritis ,   Abdominal   Peds  Hematology   Anesthesia Other Findings   Reproductive/Obstetrics                            Anesthesia Physical Anesthesia Plan  ASA: IV  Anesthesia Plan: General   Post-op Pain Management:    Induction: Intravenous  PONV Risk Score and Plan: 1 and Ondansetron, Dexamethasone, Midazolam and Treatment may vary due to age or medical condition  Airway Management Planned: Oral ETT  Additional Equipment:   Intra-op Plan:   Post-operative Plan: Extubation in OR  Informed Consent: I have reviewed the patients History and Physical, chart, labs and discussed the procedure including the risks, benefits and alternatives for the proposed anesthesia with the patient or authorized representative who has indicated his/her understanding and acceptance.       Plan Discussed with: CRNA, Anesthesiologist and Surgeon  Anesthesia Plan Comments: (PAT note written 11/20/2018 by Myra Gianotti, PA-C.  )       Anesthesia Quick Evaluation

## 2018-11-21 ENCOUNTER — Encounter (HOSPITAL_COMMUNITY): Payer: Self-pay | Admitting: *Deleted

## 2018-11-21 ENCOUNTER — Other Ambulatory Visit: Payer: Self-pay

## 2018-11-21 ENCOUNTER — Observation Stay (HOSPITAL_COMMUNITY)
Admission: RE | Admit: 2018-11-21 | Discharge: 2018-11-22 | Disposition: A | Discharge status: Home / Self Care | Payer: Medicare Other | Source: Home / Self Care | Attending: Surgery | Admitting: Surgery

## 2018-11-21 ENCOUNTER — Ambulatory Visit (HOSPITAL_COMMUNITY): Payer: Medicare Other | Admitting: Vascular Surgery

## 2018-11-21 ENCOUNTER — Encounter (HOSPITAL_COMMUNITY)
Admission: RE | Disposition: A | Discharge status: Home / Self Care | Payer: Self-pay | Source: Home / Self Care | Attending: Surgery

## 2018-11-21 DIAGNOSIS — E1143 Type 2 diabetes mellitus with diabetic autonomic (poly)neuropathy: Secondary | ICD-10-CM | POA: Diagnosis not present

## 2018-11-21 DIAGNOSIS — A419 Sepsis, unspecified organism: Secondary | ICD-10-CM | POA: Diagnosis not present

## 2018-11-21 DIAGNOSIS — I5022 Chronic systolic (congestive) heart failure: Secondary | ICD-10-CM | POA: Diagnosis not present

## 2018-11-21 DIAGNOSIS — N433 Hydrocele, unspecified: Secondary | ICD-10-CM | POA: Diagnosis not present

## 2018-11-21 DIAGNOSIS — K4020 Bilateral inguinal hernia, without obstruction or gangrene, not specified as recurrent: Secondary | ICD-10-CM | POA: Insufficient documentation

## 2018-11-21 DIAGNOSIS — Z992 Dependence on renal dialysis: Secondary | ICD-10-CM | POA: Insufficient documentation

## 2018-11-21 DIAGNOSIS — Z951 Presence of aortocoronary bypass graft: Secondary | ICD-10-CM | POA: Insufficient documentation

## 2018-11-21 DIAGNOSIS — Z79899 Other long term (current) drug therapy: Secondary | ICD-10-CM | POA: Insufficient documentation

## 2018-11-21 DIAGNOSIS — N492 Inflammatory disorders of scrotum: Secondary | ICD-10-CM | POA: Diagnosis not present

## 2018-11-21 DIAGNOSIS — E1122 Type 2 diabetes mellitus with diabetic chronic kidney disease: Secondary | ICD-10-CM | POA: Insufficient documentation

## 2018-11-21 DIAGNOSIS — Z89412 Acquired absence of left great toe: Secondary | ICD-10-CM | POA: Insufficient documentation

## 2018-11-21 DIAGNOSIS — I252 Old myocardial infarction: Secondary | ICD-10-CM | POA: Insufficient documentation

## 2018-11-21 DIAGNOSIS — K219 Gastro-esophageal reflux disease without esophagitis: Secondary | ICD-10-CM | POA: Diagnosis not present

## 2018-11-21 DIAGNOSIS — G9341 Metabolic encephalopathy: Secondary | ICD-10-CM | POA: Diagnosis not present

## 2018-11-21 DIAGNOSIS — E1151 Type 2 diabetes mellitus with diabetic peripheral angiopathy without gangrene: Secondary | ICD-10-CM | POA: Insufficient documentation

## 2018-11-21 DIAGNOSIS — G8929 Other chronic pain: Secondary | ICD-10-CM | POA: Diagnosis not present

## 2018-11-21 DIAGNOSIS — Z89421 Acquired absence of other right toe(s): Secondary | ICD-10-CM | POA: Insufficient documentation

## 2018-11-21 DIAGNOSIS — D631 Anemia in chronic kidney disease: Secondary | ICD-10-CM | POA: Diagnosis not present

## 2018-11-21 DIAGNOSIS — N186 End stage renal disease: Secondary | ICD-10-CM | POA: Insufficient documentation

## 2018-11-21 DIAGNOSIS — F419 Anxiety disorder, unspecified: Secondary | ICD-10-CM | POA: Diagnosis not present

## 2018-11-21 DIAGNOSIS — I1311 Hypertensive heart and chronic kidney disease without heart failure, with stage 5 chronic kidney disease, or end stage renal disease: Secondary | ICD-10-CM | POA: Insufficient documentation

## 2018-11-21 DIAGNOSIS — Z89422 Acquired absence of other left toe(s): Secondary | ICD-10-CM | POA: Insufficient documentation

## 2018-11-21 DIAGNOSIS — M545 Low back pain: Secondary | ICD-10-CM | POA: Diagnosis not present

## 2018-11-21 DIAGNOSIS — E1140 Type 2 diabetes mellitus with diabetic neuropathy, unspecified: Secondary | ICD-10-CM | POA: Insufficient documentation

## 2018-11-21 DIAGNOSIS — G4733 Obstructive sleep apnea (adult) (pediatric): Secondary | ICD-10-CM | POA: Insufficient documentation

## 2018-11-21 DIAGNOSIS — Z20828 Contact with and (suspected) exposure to other viral communicable diseases: Secondary | ICD-10-CM | POA: Diagnosis not present

## 2018-11-21 DIAGNOSIS — Z96652 Presence of left artificial knee joint: Secondary | ICD-10-CM | POA: Insufficient documentation

## 2018-11-21 DIAGNOSIS — Z8673 Personal history of transient ischemic attack (TIA), and cerebral infarction without residual deficits: Secondary | ICD-10-CM | POA: Insufficient documentation

## 2018-11-21 DIAGNOSIS — F17200 Nicotine dependence, unspecified, uncomplicated: Secondary | ICD-10-CM | POA: Insufficient documentation

## 2018-11-21 DIAGNOSIS — I2510 Atherosclerotic heart disease of native coronary artery without angina pectoris: Secondary | ICD-10-CM | POA: Insufficient documentation

## 2018-11-21 DIAGNOSIS — Z89612 Acquired absence of left leg above knee: Secondary | ICD-10-CM | POA: Diagnosis not present

## 2018-11-21 DIAGNOSIS — Z7982 Long term (current) use of aspirin: Secondary | ICD-10-CM | POA: Insufficient documentation

## 2018-11-21 DIAGNOSIS — I132 Hypertensive heart and chronic kidney disease with heart failure and with stage 5 chronic kidney disease, or end stage renal disease: Secondary | ICD-10-CM | POA: Diagnosis not present

## 2018-11-21 HISTORY — PX: INGUINAL HERNIA REPAIR: SHX194

## 2018-11-21 LAB — CBC
HCT: 40.8 % (ref 39.0–52.0)
Hemoglobin: 12.4 g/dL — ABNORMAL LOW (ref 13.0–17.0)
MCH: 29.2 pg (ref 26.0–34.0)
MCHC: 30.4 g/dL (ref 30.0–36.0)
MCV: 96.2 fL (ref 80.0–100.0)
Platelets: 173 10*3/uL (ref 150–400)
RBC: 4.24 MIL/uL (ref 4.22–5.81)
RDW: 14.1 % (ref 11.5–15.5)
WBC: 7.1 10*3/uL (ref 4.0–10.5)
nRBC: 0 % (ref 0.0–0.2)

## 2018-11-21 LAB — POCT I-STAT, CHEM 8
BUN: 33 mg/dL — ABNORMAL HIGH (ref 6–20)
Calcium, Ion: 1.17 mmol/L (ref 1.15–1.40)
Chloride: 99 mmol/L (ref 98–111)
Creatinine, Ser: 5.9 mg/dL — ABNORMAL HIGH (ref 0.61–1.24)
Glucose, Bld: 132 mg/dL — ABNORMAL HIGH (ref 70–99)
HCT: 35 % — ABNORMAL LOW (ref 39.0–52.0)
Hemoglobin: 11.9 g/dL — ABNORMAL LOW (ref 13.0–17.0)
Potassium: 5.9 mmol/L — ABNORMAL HIGH (ref 3.5–5.1)
Sodium: 138 mmol/L (ref 135–145)
TCO2: 29 mmol/L (ref 22–32)

## 2018-11-21 LAB — GLUCOSE, CAPILLARY
Glucose-Capillary: 130 mg/dL — ABNORMAL HIGH (ref 70–99)
Glucose-Capillary: 128 mg/dL — ABNORMAL HIGH (ref 70–99)

## 2018-11-21 SURGERY — REPAIR, HERNIA, INGUINAL, BILATERAL, ADULT
Anesthesia: General | Site: Inguinal | Laterality: Bilateral

## 2018-11-21 MED ORDER — TEMAZEPAM 15 MG PO CAPS
30.0000 mg | ORAL_CAPSULE | Freq: Every day | ORAL | Status: DC
  Administered 2018-11-21: 30 mg via ORAL
  Filled 2018-11-21: qty 2

## 2018-11-21 MED ORDER — LIDOCAINE 2% (20 MG/ML) 5 ML SYRINGE
INTRAMUSCULAR | Status: AC
  Filled 2018-11-21: qty 5

## 2018-11-21 MED ORDER — DEXAMETHASONE SODIUM PHOSPHATE 10 MG/ML IJ SOLN
INTRAMUSCULAR | Status: DC | PRN
  Administered 2018-11-21: 4 mg via INTRAVENOUS

## 2018-11-21 MED ORDER — ROCURONIUM BROMIDE 10 MG/ML (PF) SYRINGE
PREFILLED_SYRINGE | INTRAVENOUS | Status: AC
  Filled 2018-11-21: qty 10

## 2018-11-21 MED ORDER — HYDROCODONE-ACETAMINOPHEN 5-325 MG PO TABS
1.0000 | ORAL_TABLET | ORAL | Status: DC | PRN
  Administered 2018-11-21 – 2018-11-22 (×3): 2 via ORAL
  Filled 2018-11-21 (×3): qty 2

## 2018-11-21 MED ORDER — GABAPENTIN 300 MG PO CAPS
ORAL_CAPSULE | ORAL | Status: AC
  Administered 2018-11-21: 300 mg via ORAL
  Filled 2018-11-21: qty 1

## 2018-11-21 MED ORDER — ENOXAPARIN SODIUM 30 MG/0.3ML ~~LOC~~ SOLN
30.0000 mg | SUBCUTANEOUS | Status: DC
  Administered 2018-11-22: 30 mg via SUBCUTANEOUS
  Filled 2018-11-21: qty 0.3

## 2018-11-21 MED ORDER — ACETAMINOPHEN 500 MG PO TABS
1000.0000 mg | ORAL_TABLET | ORAL | Status: AC
  Administered 2018-11-21: 09:00:00 1000 mg via ORAL

## 2018-11-21 MED ORDER — CEFAZOLIN SODIUM-DEXTROSE 2-4 GM/100ML-% IV SOLN
INTRAVENOUS | Status: AC
  Filled 2018-11-21: qty 100

## 2018-11-21 MED ORDER — 0.9 % SODIUM CHLORIDE (POUR BTL) OPTIME
TOPICAL | Status: DC | PRN
  Administered 2018-11-21: 1000 mL

## 2018-11-21 MED ORDER — PHENYLEPHRINE HCL-NACL 10-0.9 MG/250ML-% IV SOLN
INTRAVENOUS | Status: DC | PRN
  Administered 2018-11-21: 50 ug/min via INTRAVENOUS

## 2018-11-21 MED ORDER — PROPOFOL 10 MG/ML IV BOLUS
INTRAVENOUS | Status: DC | PRN
  Administered 2018-11-21: 150 mg via INTRAVENOUS

## 2018-11-21 MED ORDER — CARVEDILOL 12.5 MG PO TABS
12.5000 mg | ORAL_TABLET | Freq: Two times a day (BID) | ORAL | Status: DC
  Administered 2018-11-21 – 2018-11-22 (×2): 12.5 mg via ORAL
  Filled 2018-11-21 (×2): qty 1

## 2018-11-21 MED ORDER — EPHEDRINE SULFATE-NACL 50-0.9 MG/10ML-% IV SOSY
PREFILLED_SYRINGE | INTRAVENOUS | Status: DC | PRN
  Administered 2018-11-21: 5 mg via INTRAVENOUS

## 2018-11-21 MED ORDER — BUPIVACAINE-EPINEPHRINE 0.5% -1:200000 IJ SOLN
INTRAMUSCULAR | Status: AC
  Filled 2018-11-21: qty 1

## 2018-11-21 MED ORDER — ONDANSETRON HCL 4 MG/2ML IJ SOLN
INTRAMUSCULAR | Status: AC
  Filled 2018-11-21: qty 2

## 2018-11-21 MED ORDER — ONDANSETRON HCL 4 MG/2ML IJ SOLN
INTRAMUSCULAR | Status: DC | PRN
  Administered 2018-11-21: 4 mg via INTRAVENOUS

## 2018-11-21 MED ORDER — SODIUM CHLORIDE 0.9 % IV SOLN: INTRAVENOUS | Status: DC

## 2018-11-21 MED ORDER — CEFAZOLIN SODIUM-DEXTROSE 2-4 GM/100ML-% IV SOLN
2.0000 g | INTRAVENOUS | Status: AC
  Administered 2018-11-21: 2 g via INTRAVENOUS

## 2018-11-21 MED ORDER — CHLORHEXIDINE GLUCONATE CLOTH 2 % EX PADS: 6.0000 | MEDICATED_PAD | Freq: Once | CUTANEOUS | Status: DC

## 2018-11-21 MED ORDER — PHENYLEPHRINE 40 MCG/ML (10ML) SYRINGE FOR IV PUSH (FOR BLOOD PRESSURE SUPPORT)
PREFILLED_SYRINGE | INTRAVENOUS | Status: AC
  Filled 2018-11-21: qty 10

## 2018-11-21 MED ORDER — GABAPENTIN 300 MG PO CAPS
300.0000 mg | ORAL_CAPSULE | ORAL | Status: AC
  Administered 2018-11-21: 09:00:00 300 mg via ORAL

## 2018-11-21 MED ORDER — HYDROMORPHONE HCL 1 MG/ML IJ SOLN
0.5000 mg | INTRAMUSCULAR | Status: DC | PRN
  Administered 2018-11-21 – 2018-11-22 (×6): 0.5 mg via INTRAVENOUS
  Filled 2018-11-21 (×6): qty 1

## 2018-11-21 MED ORDER — FENTANYL CITRATE (PF) 250 MCG/5ML IJ SOLN
INTRAMUSCULAR | Status: AC
  Filled 2018-11-21: qty 5

## 2018-11-21 MED ORDER — SEVELAMER CARBONATE 800 MG PO TABS
1600.0000 mg | ORAL_TABLET | Freq: Three times a day (TID) | ORAL | Status: DC
  Administered 2018-11-21 – 2018-11-22 (×2): 1600 mg via ORAL
  Administered 2018-11-22: 2400 mg via ORAL
  Filled 2018-11-21 (×2): qty 2
  Filled 2018-11-21: qty 4
  Filled 2018-11-21: qty 2

## 2018-11-21 MED ORDER — AMLODIPINE BESYLATE 5 MG PO TABS
5.0000 mg | ORAL_TABLET | Freq: Every evening | ORAL | Status: DC
  Administered 2018-11-21: 5 mg via ORAL
  Filled 2018-11-21: qty 1

## 2018-11-21 MED ORDER — OXYCODONE HCL 5 MG PO TABS: 5.0000 mg | ORAL_TABLET | Freq: Once | ORAL | Status: DC | PRN

## 2018-11-21 MED ORDER — RENA-VITE PO TABS
1.0000 | ORAL_TABLET | Freq: Every day | ORAL | Status: DC
  Administered 2018-11-21: 1 via ORAL
  Filled 2018-11-21: qty 1

## 2018-11-21 MED ORDER — FENTANYL CITRATE (PF) 100 MCG/2ML IJ SOLN
INTRAMUSCULAR | Status: AC
  Administered 2018-11-21: 50 ug via INTRAVENOUS
  Filled 2018-11-21: qty 2

## 2018-11-21 MED ORDER — PROPOFOL 10 MG/ML IV BOLUS
INTRAVENOUS | Status: AC
  Filled 2018-11-21: qty 20

## 2018-11-21 MED ORDER — HYDROXYZINE HCL 25 MG PO TABS
50.0000 mg | ORAL_TABLET | Freq: Three times a day (TID) | ORAL | Status: DC | PRN
  Administered 2018-11-22: 50 mg via ORAL
  Filled 2018-11-21: qty 2

## 2018-11-21 MED ORDER — GABAPENTIN 300 MG PO CAPS
300.0000 mg | ORAL_CAPSULE | Freq: Two times a day (BID) | ORAL | Status: DC
  Administered 2018-11-21 – 2018-11-22 (×3): 300 mg via ORAL
  Filled 2018-11-21 (×3): qty 1

## 2018-11-21 MED ORDER — FENTANYL CITRATE (PF) 250 MCG/5ML IJ SOLN
INTRAMUSCULAR | Status: DC | PRN
  Administered 2018-11-21: 75 ug via INTRAVENOUS
  Administered 2018-11-21: 25 ug via INTRAVENOUS

## 2018-11-21 MED ORDER — OXYCODONE HCL 5 MG/5ML PO SOLN: 5.0000 mg | Freq: Once | ORAL | Status: DC | PRN

## 2018-11-21 MED ORDER — DEXAMETHASONE SODIUM PHOSPHATE 10 MG/ML IJ SOLN
INTRAMUSCULAR | Status: AC
  Filled 2018-11-21: qty 1

## 2018-11-21 MED ORDER — BUPIVACAINE-EPINEPHRINE 0.5% -1:200000 IJ SOLN
INTRAMUSCULAR | Status: DC | PRN
  Administered 2018-11-21: 20 mL

## 2018-11-21 MED ORDER — SODIUM CHLORIDE 0.9 % IV SOLN
INTRAVENOUS | Status: DC | PRN
  Administered 2018-11-21: 11:00:00 via INTRAVENOUS

## 2018-11-21 MED ORDER — NEOSTIGMINE METHYLSULFATE 3 MG/3ML IV SOSY
PREFILLED_SYRINGE | INTRAVENOUS | Status: DC | PRN
  Administered 2018-11-21: 3 mg via INTRAVENOUS

## 2018-11-21 MED ORDER — FENTANYL CITRATE (PF) 100 MCG/2ML IJ SOLN
25.0000 ug | INTRAMUSCULAR | Status: DC | PRN
  Administered 2018-11-21 (×2): 50 ug via INTRAVENOUS

## 2018-11-21 MED ORDER — MIDAZOLAM HCL 2 MG/2ML IJ SOLN
INTRAMUSCULAR | Status: AC
  Filled 2018-11-21: qty 2

## 2018-11-21 MED ORDER — EPHEDRINE 5 MG/ML INJ
INTRAVENOUS | Status: AC
  Filled 2018-11-21: qty 10

## 2018-11-21 MED ORDER — PHENYLEPHRINE 40 MCG/ML (10ML) SYRINGE FOR IV PUSH (FOR BLOOD PRESSURE SUPPORT)
PREFILLED_SYRINGE | INTRAVENOUS | Status: DC | PRN
  Administered 2018-11-21 (×2): 80 ug via INTRAVENOUS
  Administered 2018-11-21: 40 ug via INTRAVENOUS

## 2018-11-21 MED ORDER — ACETAMINOPHEN 500 MG PO TABS
ORAL_TABLET | ORAL | Status: AC
  Administered 2018-11-21: 1000 mg via ORAL
  Filled 2018-11-21: qty 2

## 2018-11-21 MED ORDER — ROCURONIUM BROMIDE 10 MG/ML (PF) SYRINGE
PREFILLED_SYRINGE | INTRAVENOUS | Status: DC | PRN
  Administered 2018-11-21: 50 mg via INTRAVENOUS
  Administered 2018-11-21 (×2): 10 mg via INTRAVENOUS

## 2018-11-21 MED ORDER — GLYCOPYRROLATE PF 0.2 MG/ML IJ SOSY
PREFILLED_SYRINGE | INTRAMUSCULAR | Status: DC | PRN
  Administered 2018-11-21: 0.4 mg via INTRAVENOUS

## 2018-11-21 SURGICAL SUPPLY — 35 items
BLADE CLIPPER SURG (BLADE) IMPLANT
CHLORAPREP W/TINT 26 (MISCELLANEOUS) ×3 IMPLANT
COVER SURGICAL LIGHT HANDLE (MISCELLANEOUS) ×3 IMPLANT
COVER WAND RF STERILE (DRAPES) IMPLANT
DERMABOND ADVANCED (GAUZE/BANDAGES/DRESSINGS) ×2
DERMABOND ADVANCED .7 DNX12 (GAUZE/BANDAGES/DRESSINGS) ×1 IMPLANT
DRAIN PENROSE 1/2X12 LTX STRL (WOUND CARE) IMPLANT
DRAPE LAPAROTOMY TRNSV 102X78 (DRAPES) ×3 IMPLANT
DRSG TEGADERM 4X4.75 (GAUZE/BANDAGES/DRESSINGS) ×3 IMPLANT
ELECT REM PT RETURN 9FT ADLT (ELECTROSURGICAL) ×3
ELECTRODE REM PT RTRN 9FT ADLT (ELECTROSURGICAL) ×1 IMPLANT
GLOVE SURG SIGNA 7.5 PF LTX (GLOVE) ×3 IMPLANT
GOWN STRL REUS W/ TWL LRG LVL3 (GOWN DISPOSABLE) ×1 IMPLANT
GOWN STRL REUS W/ TWL XL LVL3 (GOWN DISPOSABLE) ×1 IMPLANT
GOWN STRL REUS W/TWL LRG LVL3 (GOWN DISPOSABLE) ×2
GOWN STRL REUS W/TWL XL LVL3 (GOWN DISPOSABLE) ×2
KIT BASIN OR (CUSTOM PROCEDURE TRAY) ×3 IMPLANT
KIT TURNOVER KIT B (KITS) ×3 IMPLANT
MESH PARIETEX PROGRIP LEFT (Mesh General) ×3 IMPLANT
MESH PARIETEX PROGRIP RIGHT (Mesh General) ×3 IMPLANT
NEEDLE HYPO 25GX1X1/2 BEV (NEEDLE) ×3 IMPLANT
NS IRRIG 1000ML POUR BTL (IV SOLUTION) ×3 IMPLANT
PACK GENERAL/GYN (CUSTOM PROCEDURE TRAY) ×3 IMPLANT
PAD ARMBOARD 7.5X6 YLW CONV (MISCELLANEOUS) ×3 IMPLANT
PENCIL SMOKE EVACUATOR (MISCELLANEOUS) ×3 IMPLANT
SUT MON AB 4-0 PC3 18 (SUTURE) ×6 IMPLANT
SUT SILK 2 0 SH (SUTURE) ×3 IMPLANT
SUT SILK 3 0 SH 30 (SUTURE) ×3 IMPLANT
SUT VIC AB 2-0 CT1 27 (SUTURE) ×4
SUT VIC AB 2-0 CT1 TAPERPNT 27 (SUTURE) ×2 IMPLANT
SUT VIC AB 3-0 CT1 27 (SUTURE) ×4
SUT VIC AB 3-0 CT1 TAPERPNT 27 (SUTURE) ×2 IMPLANT
SYR CONTROL 10ML LL (SYRINGE) ×3 IMPLANT
TOWEL GREEN STERILE (TOWEL DISPOSABLE) ×3 IMPLANT
TOWEL GREEN STERILE FF (TOWEL DISPOSABLE) ×3 IMPLANT

## 2018-11-21 NOTE — Anesthesia Procedure Notes (Signed)
Procedure Name: Intubation Performed by: Atalie Oros H, CRNA Pre-anesthesia Checklist: Patient identified, Emergency Drugs available, Suction available and Patient being monitored Patient Re-evaluated:Patient Re-evaluated prior to induction Oxygen Delivery Method: Circle System Utilized Preoxygenation: Pre-oxygenation with 100% oxygen Induction Type: IV induction Ventilation: Mask ventilation without difficulty Laryngoscope Size: Mac and 3 Grade View: Grade I Tube type: Oral Tube size: 7.5 mm Number of attempts: 1 Airway Equipment and Method: Stylet and Oral airway Placement Confirmation: ETT inserted through vocal cords under direct vision,  positive ETCO2 and breath sounds checked- equal and bilateral Secured at: 23 cm Tube secured with: Tape Dental Injury: Teeth and Oropharynx as per pre-operative assessment        

## 2018-11-21 NOTE — Anesthesia Postprocedure Evaluation (Signed)
Anesthesia Post Note  Patient: Matthew Kline  Procedure(s) Performed: BILATERAL INGUINAL HERNIA REPAIR WITH MESH (Bilateral Inguinal)     Patient location during evaluation: PACU Anesthesia Type: General Level of consciousness: awake Pain management: pain level controlled Vital Signs Assessment: post-procedure vital signs reviewed and stable Respiratory status: spontaneous breathing and patient remains intubated per anesthesia plan Cardiovascular status: stable Postop Assessment: no apparent nausea or vomiting Anesthetic complications: no    Last Vitals:  Vitals:   11/21/18 1255 11/21/18 1310  BP: 130/87 125/76  Pulse: 71 70  Resp: 20 12  Temp:    SpO2: 92% 96%    Last Pain:  Vitals:   11/21/18 1313  TempSrc:   PainSc: Asleep                 Leslee Haueter

## 2018-11-21 NOTE — Op Note (Signed)
BILATERAL INGUINAL HERNIA REPAIR WITH MESH  Procedure Note  Matthew Kline 11/21/2018   Pre-op Diagnosis: BILATERAL INGUINAL HERNIA     Post-op Diagnosis: same  Procedure(s): BILATERAL INGUINAL HERNIA REPAIR WITH MESH  Surgeon(s): Coralie Keens, MD  Anesthesia: General  Staff:  Circulator: Carlynn Purl, RN Relief Circulator: Fabian Sharp, RN Scrub Person: Rodney Booze, EMT; McEneany, Amy L, CST; Teschner, Mindy K, CST  Estimated Blood Loss: Minimal               Findings: The patient was found to have a large indirect right inguinal hernia and a small direct left inguinal hernia.  Both were repaired with 2 separate pieces of large Prolene progrip mesh from [employer]  Procedure: The patient was brought to the operating identifies correct patient.  He is placed upon the operating table general anesthesia was induced.  His lower abdomen was then prepped and draped in usual sterile fashion.  I made a longitudinal incision on the right inguinal area with a scalpel.  I carried this down through Scarpa's fascia with electrocautery.  The external oblique fascia was then identified and opened toward the internal and external rings.  The patient's testicular cord and very large indirect hernia sac were controlled the Penrose drain.  I separated the cord structures from the hernia sac.  All contents were reduced back to the abdominal cavity.  I then tied off the base of the sac with a 2-0 silk suture.  I then excised the redundant sac with the cautery.  Next a piece of progrip Prolene mesh was brought to the field.  I placed against the pubic tubercle and brought around the cord structures.  I then sutured in place with 2 separate 2-0 Vicryl sutures.  Good coverage of the inguinal floor and indirect ring appeared to be achieved.  I then closed the external beak fascia over top of the mesh with a running 2-0 Vicryl suture.  I anesthetized the fascia and skin with Marcaine.  I  then closed office fascia with interrupted 3-0 Vicryl sutures and closed skin with running 4-0 Monocryl. I next made a longitudinal incision in the patient's left inguinal area with a scalpel.  I again took this down through Scarpa's fascia with cautery.  I then opened the external beak fascia toward the internal and external ring.  I then identified a small direct hernia defect without evidence of indirect hernia defect.  All contents have been reduced into the abdominal cavity.  I then used a large piece of Prolene ProGrip mesh from [employer] in place this is an only on inguinal floor and brought around the cord structures.  I then sutured to the pubic tubercle with a 2-0 Vicryl suture.  I then closed the external beak fascia over top of the mesh with a running 2-0 Vicryl suture.  I anesthetized the fascia and skin with Marcaine.  I then closed Scarpa's fascia with interrupted 3-0 Vicryl sutures and closed skin with running 4-0 Monocryl.  Dermabond was then applied to both incisions.  The patient tolerated the procedure well.  All the counts were correct at the end of the procedure.  The patient was then extubated in the operating room and taken in a stable condition to the recovery room.          Coralie Keens   Date: 11/21/2018  Time: 12:31 PM

## 2018-11-21 NOTE — Interval H&P Note (Signed)
History and Physical Interval Note: no change in H and P  11/21/2018 11:01 AM  Matthew Kline  has presented today for surgery, with the diagnosis of BILATERAL INGUINAL HERNIA.  The various methods of treatment have been discussed with the patient and family. After consideration of risks, benefits and other options for treatment, the patient has consented to  Procedure(s): BILATERAL INGUINAL HERNIA REPAIR WITH MESH (Bilateral) as a surgical intervention.  The patient's history has been reviewed, patient examined, no change in status, stable for surgery.  I have reviewed the patient's chart and labs.  Questions were answered to the patient's satisfaction.     Coralie Keens

## 2018-11-21 NOTE — Transfer of Care (Signed)
Immediate Anesthesia Transfer of Care Note  Patient: Matthew Kline  Procedure(s) Performed: BILATERAL INGUINAL HERNIA REPAIR WITH MESH (Bilateral Inguinal)  Patient Location: PACU  Anesthesia Type:General  Level of Consciousness: drowsy  Airway & Oxygen Therapy: Patient Spontanous Breathing  Post-op Assessment: Report given to RN and Post -op Vital signs reviewed and stable  Post vital signs: Reviewed and stable  Last Vitals:  Vitals Value Taken Time  BP 126/83 11/21/18 1240  Temp 36.5 C 11/21/18 1240  Pulse 71 11/21/18 1242  Resp 17 11/21/18 1242  SpO2 97 % 11/21/18 1242  Vitals shown include unvalidated device data.  Last Pain:  Vitals:   11/21/18 1240  TempSrc:   PainSc: (P) Asleep      Patients Stated Pain Goal: 4 (44/01/02 7253)  Complications: No apparent anesthesia complications

## 2018-11-22 ENCOUNTER — Encounter (HOSPITAL_COMMUNITY): Payer: Self-pay | Admitting: Surgery

## 2018-11-22 DIAGNOSIS — A419 Sepsis, unspecified organism: Secondary | ICD-10-CM | POA: Diagnosis not present

## 2018-11-22 MED ORDER — OXYCODONE HCL 5 MG PO TABS: 5.0000 mg | ORAL_TABLET | Freq: Four times a day (QID) | ORAL | 0 refills | Status: DC | PRN

## 2018-11-22 MED ORDER — CHLORHEXIDINE GLUCONATE CLOTH 2 % EX PADS: 6.0000 | MEDICATED_PAD | Freq: Every day | CUTANEOUS | Status: DC

## 2018-11-22 NOTE — Progress Notes (Signed)
Asked to see patient for HD before he is dc'd.  Pt seen in room, doing well postop bilat inguiinal hernia repair. No SOB or chest pain.  Exam w/o edema, clear lungs. Plan HD around lunch time.  If pt fully admitted will do formal consult.   Kelly Splinter, MD 11/22/2018, 10:01 AM

## 2018-11-22 NOTE — Progress Notes (Signed)
Renal Navigator notes patient's plan for discharge today. Renal Navigator spoke with Nephrologist/Dr. Jonnie Finner, patient and OP HD clinic/NW.  Patient is cleared for discharge at this time from a Renal standpoint and will not have HD in the hospital prior to discharge. His next HD treatment will be at his OP HD clinic/[facility] at 6:20am tomorrow, 11/23/18. He needs to arrive at 6:00am. He states understanding and is appreciative of this plan. Attending/Dr. Ninfa Linden notified. Navigator notified Unit Secretary and asked that RN also be notified of plan.  Alphonzo Cruise, [facility] Renal Navigator [phone]

## 2018-11-22 NOTE — Discharge Summary (Signed)
Physician Discharge Summary  [name] MRN: [id number] DOB/AGE: 1960-05-24 58 y.o.  Admit date: 11/21/2018 Discharge date: 11/22/2018  Admission Diagnoses:  Discharge Diagnoses:  Active Problems:   Bilateral inguinal hernia   Discharged Condition: good  Hospital Course: uneventful post op recovery.  Discharged home pod#1  Consults: nephrology  Significant Diagnostic Studies:   Treatments: surgery: bilateral inguinal hernia repair with mesh  Discharge Exam: Blood pressure (!) 142/94, pulse 78, temperature 97.7 F (36.5 C), temperature source Oral, resp. rate 17, height 5\' 11"  (1.803 m), weight 65.7 kg, SpO2 99 %. General appearance: alert, cooperative and no distress Resp: clear to auscultation bilaterally Incision/Wound:incisions clean, minimal swelling  Disposition: Discharge disposition: 01-Home or Self Care        Allergies as of 11/22/2018      Reactions   Morphine And Related Other (See Comments)   hallucinations   Tygacil [tigecycline] Nausea And Vomiting, Other (See Comments)   Imodium [loperamide] Itching      Medication List    TAKE these medications   acetaminophen 500 MG tablet Commonly known as: TYLENOL Take 1,000 mg by mouth every 6 (six) hours as needed for mild pain or fever.   amLODipine 5 MG tablet Commonly known as: NORVASC Take 5 mg by mouth every evening.   aspirin 81 MG EC tablet Take 2 tablets (162 mg total) by mouth daily. What changed:   how much to take  when to take this   carvedilol 12.5 MG tablet Commonly known as: COREG Take 1 tablet (12.5 mg total) by mouth 2 (two) times daily with a meal.   Darbepoetin Alfa 150 MCG/0.3ML Sosy injection Commonly known as: ARANESP Inject 0.3 mLs (150 mcg total) into the vein every Friday with hemodialysis.   gabapentin 100 MG capsule Commonly known as: NEURONTIN Take 200 mg by mouth every evening.   heparin 1000 UNIT/ML injection Inject 4 mLs into the vein  See admin instructions. Every hemodialysis treatment   hydrOXYzine 50 MG tablet Commonly known as: ATARAX/VISTARIL Take 50 mg by mouth 3 (three) times daily as needed for itching.   multivitamin Tabs tablet Take 1 tablet by mouth at bedtime.   oxyCODONE 5 MG immediate release tablet Commonly known as: Oxy IR/ROXICODONE Take 1 tablet (5 mg total) by mouth every 6 (six) hours as needed for moderate pain, severe pain or breakthrough pain.   sevelamer carbonate 800 MG tablet Commonly known as: RENVELA Take 1,600-3,200 mg by mouth 3 (three) times daily with meals. 2-4 tabs with each meal   Singulair 10 MG tablet Generic drug: montelukast Take 10 mg by mouth at bedtime as needed.   temazepam 30 MG capsule Commonly known as: RESTORIL Take 30 mg by mouth at bedtime.      Follow-up Information    Coralie Keens, MD. Schedule an appointment as soon as possible for a visit in 4 week(s).   Specialty: General Surgery Contact information: [address] [place] [address] [phone]           Signed: Coralie Keens 11/22/2018, 9:47 AM

## 2018-11-22 NOTE — Progress Notes (Signed)
[name], male   DOB: 1960-10-04, 58 y.o.   MRN: [id number]   Doing well Wants to just go home Abdomen soft, incisions doing well  D/c home

## 2018-11-22 NOTE — Plan of Care (Signed)

## 2018-11-22 NOTE — Discharge Instructions (Signed)
[facility] _____[facility]  UMBILICAL OR INGUINAL HERNIA REPAIR: POST OP INSTRUCTIONS  Always review your discharge instruction sheet given to you by the facility where your surgery was performed. IF YOU HAVE DISABILITY OR FAMILY LEAVE FORMS, YOU MUST BRING THEM TO THE OFFICE FOR PROCESSING.   DO NOT GIVE THEM TO YOUR DOCTOR.  1. A  prescription for pain medication may be given to you upon discharge.  Take your pain medication as prescribed, if needed.  If narcotic pain medicine is not needed, then you may take acetaminophen (Tylenol) or ibuprofen (Advil) as needed. 2. Take your usually prescribed medications unless otherwise directed. If you need a refill on your pain medication, please contact your pharmacy.  They will contact our office to request authorization. Prescriptions will not be filled after 5 pm or on week-ends. 3. You should follow a light diet the first 24 hours after arrival home, such as soup and crackers, etc.  Be sure to include lots of fluids daily.  Resume your normal diet the day after surgery. 4.Most patients will experience some swelling and bruising around the umbilicus or in the groin and scrotum.  Ice packs and reclining will help.  Swelling and bruising can take several days to resolve.  6. It is common to experience some constipation if taking pain medication after surgery.  Increasing fluid intake and taking a stool softener (such as Colace) will usually help or prevent this problem from occurring.  A mild laxative (Milk of Magnesia or Miralax) should be taken according to package directions if there are no bowel movements after 48 hours. 7. Unless discharge instructions indicate otherwise, you may remove your bandages 24-48 hours after surgery, and you may shower at that time.  You may have steri-strips (small skin tapes) in place directly over the incision.  These strips should be left on the skin for 7-10 days.  If your surgeon used skin glue on the  incision, you may shower in 24 hours.  The glue will flake off over the next 2-3 weeks.  Any sutures or staples will be removed at the office during your follow-up visit. 8. ACTIVITIES:  You may resume regular (light) daily activities beginning the next day--such as daily self-care, walking, climbing stairs--gradually increasing activities as tolerated.  You may have sexual intercourse when it is comfortable.  Refrain from any heavy lifting or straining until approved by your doctor.  a.You may drive when you are no longer taking prescription pain medication, you can comfortably wear a seatbelt, and you can safely maneuver your car and apply brakes. b.RETURN TO WORK:   _____________________________________________  9.You should see your doctor in the office for a follow-up appointment approximately 2-3 weeks after your surgery.  Make sure that you call for this appointment within a day or two after you arrive home to insure a convenient appointment time. 10.OTHER INSTRUCTIONS: _OK TO SHOWER NO LIFTING MORE THAN 15 POUNDS FOR 4 WEEKS________________________    _____________________________________  WHEN TO CALL YOUR DOCTOR: 1. Fever over 101.0 2. Inability to urinate 3. Nausea and/or vomiting 4. Extreme swelling or bruising 5. Continued bleeding from incision. 6. Increased pain, redness, or drainage from the incision  The clinic staff is available to answer your questions during regular business hours.  Please dont hesitate to call and ask to speak to one of the nurses for clinical concerns.  If you have a medical emergency, go to the nearest emergency room or call 911.  A surgeon from [employer]  [facility] is always on call at the hospital   [address], [place], [place], [place]  [address] ?  [address], [place], [place]   [address] [phone] ? [phone] ? FAX [phone] Web site: [email or web address]

## 2018-11-23 ENCOUNTER — Telehealth: Payer: Self-pay | Admitting: Physician Assistant

## 2018-11-23 NOTE — Telephone Encounter (Signed)
Transition of care from inpatient facility  Date of discharge: 11/22/2018 Date of contact: 11/23/2018 Method of contact: Phone  Patient contacted to discuss transition of care from recent inpatient hospitalization. Patient was admitted from [facility] from 11/21/18- 11/22/18 with a diagnosis of bilateral inguinal hernia repair. Medications were reviewed. Patient had a dialysis appointment this AM but did not attend. He reports difficulty finding a ride, fatigue and abdominal pain. Patient will follow up with his outpatient dialysis center on 11/24/18. He was advised to go to the ED if he feels too sick to attend his outpatient dialysis. Other follow up needed includes general surgery for a visit in 4 weeks.

## 2018-11-24 ENCOUNTER — Emergency Department (HOSPITAL_COMMUNITY): Payer: Medicare Other

## 2018-11-24 ENCOUNTER — Encounter (HOSPITAL_COMMUNITY): Payer: Self-pay | Admitting: Emergency Medicine

## 2018-11-24 ENCOUNTER — Inpatient Hospital Stay (HOSPITAL_COMMUNITY): Payer: Medicare Other

## 2018-11-24 ENCOUNTER — Inpatient Hospital Stay (HOSPITAL_COMMUNITY)
Admission: EM | Admit: 2018-11-24 | Discharge: 2018-11-27 | DRG: 853 | Disposition: A | Discharge status: Home / Self Care | Payer: Medicare Other | Attending: Family Medicine | Admitting: Family Medicine

## 2018-11-24 DIAGNOSIS — D638 Anemia in other chronic diseases classified elsewhere: Secondary | ICD-10-CM | POA: Diagnosis not present

## 2018-11-24 DIAGNOSIS — D631 Anemia in chronic kidney disease: Secondary | ICD-10-CM | POA: Diagnosis present

## 2018-11-24 DIAGNOSIS — N186 End stage renal disease: Secondary | ICD-10-CM | POA: Diagnosis present

## 2018-11-24 DIAGNOSIS — D696 Thrombocytopenia, unspecified: Secondary | ICD-10-CM | POA: Diagnosis not present

## 2018-11-24 DIAGNOSIS — Z89421 Acquired absence of other right toe(s): Secondary | ICD-10-CM

## 2018-11-24 DIAGNOSIS — N433 Hydrocele, unspecified: Secondary | ICD-10-CM | POA: Diagnosis not present

## 2018-11-24 DIAGNOSIS — Z89612 Acquired absence of left leg above knee: Secondary | ICD-10-CM

## 2018-11-24 DIAGNOSIS — N50819 Testicular pain, unspecified: Secondary | ICD-10-CM

## 2018-11-24 DIAGNOSIS — E119 Type 2 diabetes mellitus without complications: Secondary | ICD-10-CM

## 2018-11-24 DIAGNOSIS — G9341 Metabolic encephalopathy: Secondary | ICD-10-CM | POA: Diagnosis present

## 2018-11-24 DIAGNOSIS — G8929 Other chronic pain: Secondary | ICD-10-CM | POA: Diagnosis present

## 2018-11-24 DIAGNOSIS — Z20828 Contact with and (suspected) exposure to other viral communicable diseases: Secondary | ICD-10-CM | POA: Diagnosis present

## 2018-11-24 DIAGNOSIS — I5022 Chronic systolic (congestive) heart failure: Secondary | ICD-10-CM | POA: Diagnosis not present

## 2018-11-24 DIAGNOSIS — I132 Hypertensive heart and chronic kidney disease with heart failure and with stage 5 chronic kidney disease, or end stage renal disease: Secondary | ICD-10-CM | POA: Diagnosis not present

## 2018-11-24 DIAGNOSIS — Z7982 Long term (current) use of aspirin: Secondary | ICD-10-CM

## 2018-11-24 DIAGNOSIS — Z885 Allergy status to narcotic agent status: Secondary | ICD-10-CM

## 2018-11-24 DIAGNOSIS — E1122 Type 2 diabetes mellitus with diabetic chronic kidney disease: Secondary | ICD-10-CM | POA: Diagnosis present

## 2018-11-24 DIAGNOSIS — E7800 Pure hypercholesterolemia, unspecified: Secondary | ICD-10-CM | POA: Diagnosis not present

## 2018-11-24 DIAGNOSIS — Z992 Dependence on renal dialysis: Secondary | ICD-10-CM

## 2018-11-24 DIAGNOSIS — F419 Anxiety disorder, unspecified: Secondary | ICD-10-CM | POA: Diagnosis not present

## 2018-11-24 DIAGNOSIS — I2510 Atherosclerotic heart disease of native coronary artery without angina pectoris: Secondary | ICD-10-CM | POA: Diagnosis not present

## 2018-11-24 DIAGNOSIS — F17210 Nicotine dependence, cigarettes, uncomplicated: Secondary | ICD-10-CM | POA: Diagnosis present

## 2018-11-24 DIAGNOSIS — Z951 Presence of aortocoronary bypass graft: Secondary | ICD-10-CM

## 2018-11-24 DIAGNOSIS — A419 Sepsis, unspecified organism: Secondary | ICD-10-CM

## 2018-11-24 DIAGNOSIS — N492 Inflammatory disorders of scrotum: Secondary | ICD-10-CM | POA: Diagnosis present

## 2018-11-24 DIAGNOSIS — I252 Old myocardial infarction: Secondary | ICD-10-CM

## 2018-11-24 DIAGNOSIS — M545 Low back pain: Secondary | ICD-10-CM | POA: Diagnosis not present

## 2018-11-24 DIAGNOSIS — Z96652 Presence of left artificial knee joint: Secondary | ICD-10-CM | POA: Diagnosis present

## 2018-11-24 DIAGNOSIS — E1143 Type 2 diabetes mellitus with diabetic autonomic (poly)neuropathy: Secondary | ICD-10-CM | POA: Diagnosis not present

## 2018-11-24 DIAGNOSIS — R410 Disorientation, unspecified: Secondary | ICD-10-CM

## 2018-11-24 DIAGNOSIS — Z89412 Acquired absence of left great toe: Secondary | ICD-10-CM

## 2018-11-24 DIAGNOSIS — G4733 Obstructive sleep apnea (adult) (pediatric): Secondary | ICD-10-CM | POA: Diagnosis present

## 2018-11-24 DIAGNOSIS — E1151 Type 2 diabetes mellitus with diabetic peripheral angiopathy without gangrene: Secondary | ICD-10-CM | POA: Diagnosis not present

## 2018-11-24 DIAGNOSIS — E1129 Type 2 diabetes mellitus with other diabetic kidney complication: Secondary | ICD-10-CM | POA: Diagnosis present

## 2018-11-24 DIAGNOSIS — Z8249 Family history of ischemic heart disease and other diseases of the circulatory system: Secondary | ICD-10-CM

## 2018-11-24 DIAGNOSIS — R4182 Altered mental status, unspecified: Secondary | ICD-10-CM

## 2018-11-24 DIAGNOSIS — Z823 Family history of stroke: Secondary | ICD-10-CM

## 2018-11-24 DIAGNOSIS — Z888 Allergy status to other drugs, medicaments and biological substances status: Secondary | ICD-10-CM

## 2018-11-24 DIAGNOSIS — Z8673 Personal history of transient ischemic attack (TIA), and cerebral infarction without residual deficits: Secondary | ICD-10-CM

## 2018-11-24 DIAGNOSIS — K4020 Bilateral inguinal hernia, without obstruction or gangrene, not specified as recurrent: Secondary | ICD-10-CM | POA: Diagnosis present

## 2018-11-24 DIAGNOSIS — Z8719 Personal history of other diseases of the digestive system: Secondary | ICD-10-CM

## 2018-11-24 DIAGNOSIS — K219 Gastro-esophageal reflux disease without esophagitis: Secondary | ICD-10-CM | POA: Diagnosis present

## 2018-11-24 DIAGNOSIS — Z89422 Acquired absence of other left toe(s): Secondary | ICD-10-CM

## 2018-11-24 DIAGNOSIS — R509 Fever, unspecified: Secondary | ICD-10-CM

## 2018-11-24 DIAGNOSIS — Z9049 Acquired absence of other specified parts of digestive tract: Secondary | ICD-10-CM

## 2018-11-24 DIAGNOSIS — Z79899 Other long term (current) drug therapy: Secondary | ICD-10-CM

## 2018-11-24 DIAGNOSIS — Z886 Allergy status to analgesic agent status: Secondary | ICD-10-CM

## 2018-11-24 DIAGNOSIS — Z981 Arthrodesis status: Secondary | ICD-10-CM

## 2018-11-24 LAB — SARS CORONAVIRUS 2 (TAT 6-24 HRS): SARS Coronavirus 2: NEGATIVE

## 2018-11-24 LAB — CBC WITH DIFFERENTIAL/PLATELET
Abs Immature Granulocytes: 0.05 10*3/uL (ref 0.00–0.07)
Basophils Absolute: 0 10*3/uL (ref 0.0–0.1)
Basophils Relative: 0 %
Eosinophils Absolute: 0.2 10*3/uL (ref 0.0–0.5)
Eosinophils Relative: 2 %
HCT: 35.1 % — ABNORMAL LOW (ref 39.0–52.0)
Hemoglobin: 11.5 g/dL — ABNORMAL LOW (ref 13.0–17.0)
Immature Granulocytes: 1 %
Lymphocytes Relative: 4 %
Lymphs Abs: 0.4 10*3/uL — ABNORMAL LOW (ref 0.7–4.0)
MCH: 29.8 pg (ref 26.0–34.0)
MCHC: 32.8 g/dL (ref 30.0–36.0)
MCV: 90.9 fL (ref 80.0–100.0)
Monocytes Absolute: 1.1 10*3/uL — ABNORMAL HIGH (ref 0.1–1.0)
Monocytes Relative: 10 %
Neutro Abs: 8.9 10*3/uL — ABNORMAL HIGH (ref 1.7–7.7)
Neutrophils Relative %: 83 %
Platelets: 131 10*3/uL — ABNORMAL LOW (ref 150–400)
RBC: 3.86 MIL/uL — ABNORMAL LOW (ref 4.22–5.81)
RDW: 13.8 % (ref 11.5–15.5)
WBC: 10.7 10*3/uL — ABNORMAL HIGH (ref 4.0–10.5)
nRBC: 0 % (ref 0.0–0.2)

## 2018-11-24 LAB — COMPREHENSIVE METABOLIC PANEL
ALT: 5 U/L (ref 0–44)
AST: 18 U/L (ref 15–41)
Albumin: 3.3 g/dL — ABNORMAL LOW (ref 3.5–5.0)
Alkaline Phosphatase: 154 U/L — ABNORMAL HIGH (ref 38–126)
Anion gap: 17 — ABNORMAL HIGH (ref 5–15)
BUN: 19 mg/dL (ref 6–20)
CO2: 27 mmol/L (ref 22–32)
Calcium: 8.9 mg/dL (ref 8.9–10.3)
Chloride: 92 mmol/L — ABNORMAL LOW (ref 98–111)
Creatinine, Ser: 4.11 mg/dL — ABNORMAL HIGH (ref 0.61–1.24)
GFR calc Af Amer: 17 mL/min — ABNORMAL LOW (ref >60)
GFR calc non Af Amer: 15 mL/min — ABNORMAL LOW (ref >60)
Glucose, Bld: 93 mg/dL (ref 70–99)
Potassium: 4.5 mmol/L (ref 3.5–5.1)
Sodium: 136 mmol/L (ref 135–145)
Total Bilirubin: 1 mg/dL (ref 0.3–1.2)
Total Protein: 7.9 g/dL (ref 6.5–8.1)

## 2018-11-24 LAB — GLUCOSE, CAPILLARY: Glucose-Capillary: 92 mg/dL (ref 70–99)

## 2018-11-24 LAB — PROTIME-INR
INR: 1.2 (ref 0.8–1.2)
Prothrombin Time: 14.7 seconds (ref 11.4–15.2)

## 2018-11-24 LAB — APTT: aPTT: 29 seconds (ref 24–36)

## 2018-11-24 LAB — LACTIC ACID, PLASMA
Lactic Acid, Venous: 1 mmol/L (ref 0.5–1.9)
Lactic Acid, Venous: 0.9 mmol/L (ref 0.5–1.9)

## 2018-11-24 MED ORDER — VANCOMYCIN HCL 10 G IV SOLR
1500.0000 mg | Freq: Once | INTRAVENOUS | Status: AC
  Administered 2018-11-24: 1500 mg via INTRAVENOUS
  Filled 2018-11-24: qty 1500

## 2018-11-24 MED ORDER — METRONIDAZOLE IN NACL 5-0.79 MG/ML-% IV SOLN
500.0000 mg | Freq: Once | INTRAVENOUS | Status: AC
  Administered 2018-11-24: 500 mg via INTRAVENOUS
  Filled 2018-11-24: qty 100

## 2018-11-24 MED ORDER — CARVEDILOL 12.5 MG PO TABS
12.5000 mg | ORAL_TABLET | Freq: Two times a day (BID) | ORAL | Status: DC
  Administered 2018-11-25 – 2018-11-27 (×5): 12.5 mg via ORAL
  Filled 2018-11-24: qty 2
  Filled 2018-11-24 (×4): qty 1

## 2018-11-24 MED ORDER — HEPARIN SODIUM (PORCINE) 5000 UNIT/ML IJ SOLN
5000.0000 [IU] | Freq: Three times a day (TID) | INTRAMUSCULAR | Status: DC
  Administered 2018-11-24 – 2018-11-27 (×8): 5000 [IU] via SUBCUTANEOUS
  Filled 2018-11-24 (×8): qty 1

## 2018-11-24 MED ORDER — AMLODIPINE BESYLATE 5 MG PO TABS
5.0000 mg | ORAL_TABLET | Freq: Every evening | ORAL | Status: DC
  Administered 2018-11-25 – 2018-11-26 (×2): 5 mg via ORAL
  Filled 2018-11-24 (×4): qty 1

## 2018-11-24 MED ORDER — ACETAMINOPHEN 325 MG PO TABS
650.0000 mg | ORAL_TABLET | Freq: Four times a day (QID) | ORAL | Status: DC | PRN
  Administered 2018-11-25: 650 mg via ORAL
  Filled 2018-11-24: qty 2

## 2018-11-24 MED ORDER — ACETAMINOPHEN 650 MG RE SUPP: 650.0000 mg | Freq: Four times a day (QID) | RECTAL | Status: DC | PRN

## 2018-11-24 MED ORDER — METRONIDAZOLE IN NACL 5-0.79 MG/ML-% IV SOLN
500.0000 mg | Freq: Three times a day (TID) | INTRAVENOUS | Status: DC
  Administered 2018-11-25 – 2018-11-27 (×7): 500 mg via INTRAVENOUS
  Filled 2018-11-24 (×8): qty 100

## 2018-11-24 MED ORDER — ASPIRIN EC 81 MG PO TBEC
81.0000 mg | DELAYED_RELEASE_TABLET | Freq: Every day | ORAL | Status: DC
  Administered 2018-11-24 – 2018-11-26 (×3): 81 mg via ORAL
  Filled 2018-11-24 (×3): qty 1

## 2018-11-24 MED ORDER — SEVELAMER CARBONATE 800 MG PO TABS
2400.0000 mg | ORAL_TABLET | Freq: Three times a day (TID) | ORAL | Status: DC
  Administered 2018-11-25 – 2018-11-27 (×7): 2400 mg via ORAL
  Filled 2018-11-24 (×6): qty 3

## 2018-11-24 MED ORDER — VANCOMYCIN HCL IN DEXTROSE 750-5 MG/150ML-% IV SOLN
750.0000 mg | INTRAVENOUS | Status: DC
  Filled 2018-11-24: qty 150

## 2018-11-24 MED ORDER — RENA-VITE PO TABS
1.0000 | ORAL_TABLET | Freq: Every day | ORAL | Status: DC
  Administered 2018-11-24 – 2018-11-26 (×3): 1 via ORAL
  Filled 2018-11-24 (×3): qty 1

## 2018-11-24 MED ORDER — GABAPENTIN 100 MG PO CAPS
200.0000 mg | ORAL_CAPSULE | Freq: Every day | ORAL | Status: DC
  Administered 2018-11-24 – 2018-11-26 (×3): 200 mg via ORAL
  Filled 2018-11-24 (×3): qty 2

## 2018-11-24 MED ORDER — SODIUM CHLORIDE 0.9 % IV SOLN
2.0000 g | INTRAVENOUS | Status: DC
  Filled 2018-11-24: qty 2

## 2018-11-24 MED ORDER — VANCOMYCIN HCL IN DEXTROSE 1-5 GM/200ML-% IV SOLN: 1000.0000 mg | Freq: Once | INTRAVENOUS | Status: DC

## 2018-11-24 MED ORDER — SEVELAMER CARBONATE 800 MG PO TABS: 1600.0000 mg | ORAL_TABLET | Freq: Three times a day (TID) | ORAL | Status: DC

## 2018-11-24 MED ORDER — SODIUM CHLORIDE 0.9 % IV SOLN
2.0000 g | Freq: Once | INTRAVENOUS | Status: AC
  Administered 2018-11-24: 2 g via INTRAVENOUS
  Filled 2018-11-24: qty 2

## 2018-11-24 NOTE — H&P (Signed)
History and Physical    Matthew Kline YBO:[id number] DOB: 01-Apr-1960 DOA: 11/24/2018  Referring MD/NP/PA: Nuala Alpha, PA-C PCP: [facility] [place]  Patient coming from: Hemodialysis center via EMS  Chief Complaint: Altered mental status  I have personally briefly reviewed patient's old medical records in [place]   HPI: Matthew Kline is a 58 y.o. male with medical history significant of ESRD on HD(M/W/F), DM type II, CAD s/p CABG, left AKA chronic low back pain, anxiety, and GERD.  History is obtained mostly from his friend who is present at bedside.  The patient is altered and not able to answer why he is here.  He had just underwent bilateral inguinal hernia repair with mesh on 3 days ago with Dr. Ninfa Linden.  Following the procedure patient had been started on oxycodone 5mg  and had taken this medication last night.  He was noted to be febrile during hemodialysis, but was able to complete his full hemodialysis session.  Patient denies having any complaints at this time.  He had apparently had difficulty doing so the night prior.  ED Course: Upon admission to the emergency department patient was seen to be febrile up to 100.6 F, respirations up to 23, and all other vital signs relatively maintained.  Labs significant for leukocytosis 10.7, hemoglobin 11.5, platelets 131, BUN 19, creatinine 4.11, anion gap 17, and lactic acid 1.  Chest x-rays concerning for atelectasis and/or scarring.  Urinalysis had not been obtained yet.  Blood cultures were obtained and the patient was started on empiric antibiotics of vancomycin, cefepime, and metronidazole.   Review of Systems  Unable to perform ROS: Mental status change    Past Medical History:  Diagnosis Date  . Anemia, unspecified   . Anxiety   . Arthralgia 2010   polyarticular  . Arthritis    "back, knees" (01/10/2017)  . Cancer [facility])    "kidney area" (01/10/2017)  . CHF (congestive heart failure) ([place]) 07/25/2009    denies  . Chronic lower back pain   . Coronary artery disease   . Coughing    pt. reports that he has drainage from sinus infection  . Diabetic foot ulcer ([place])   . Diabetic neuropathy ([place])   . ESRD (end stage renal disease) on dialysis [facility])    started 12/2012; "MWF; [facility] "  (01/10/2017)  . GERD (gastroesophageal reflux disease)    hx "before I lost weight", no problem 9 years  . Hemodialysis access site with mature fistula ([place])   . Hemorrhoids, internal 10/2011   small  . High cholesterol   . History of blood transfusion    "related to the anemia"  . Hypertension   . Insomnia, unspecified   . Lacunar infarction ([place]) 2006   RUE/RLE, speech  . Long term (current) use of anticoagulants   . Myocardial infarction ([place]) 1995  . Orthostatic hypotension   . Osteomyelitis of foot, left, acute ([place])   . Other chronic postoperative pain   . Pneumonia    "probably twice" (01/10/2017)  . Polymyalgia rheumatica ([place])   . Renal insufficiency   . Sleep apnea    "lost weight; no more problem" (01/10/2017)  . Stroke ([place]) 01/10/06   denies residual on 05/09/2014  . Type II diabetes mellitus ([place]) dx'd 1995  . Unspecified hereditary and idiopathic peripheral neuropathy    feet  . Unspecified osteomyelitis, site unspecified   . Unspecified vitamin D deficiency     Past Surgical History:  Procedure Laterality Date  .  ABDOMINAL AORTOGRAM N/A 08/25/2016   Procedure: ABDOMINAL AORTOGRAM;  Surgeon: Wellington Hampshire, MD;  Location: [place] [facility];  Service: Cardiovascular;  Laterality: N/A;  . AMPUTATION  01/21/2012   Procedure: AMPUTATION RAY;  Surgeon: [name], MD;  Location: [place];  Service: Orthopedics;  Laterality: Left;  Left Foot 4th Ray Amputation  . AMPUTATION Left 05/04/2013   Procedure: AMPUTATION DIGIT;  Surgeon: [name], MD;  Location: [place];  Service: Orthopedics;  Laterality: Left;  Left Great Toe Amputation at [facility]  . AMPUTATION Left 01/14/2017    Procedure: AMPUTATION ABOVE LEFT KNEE;  Surgeon: [name], MD;  Location: [place];  Service: Orthopedics;  Laterality: Left;  . ANTERIOR CERVICAL DECOMP/DISCECTOMY FUSION  02/2011  . BACK SURGERY    . BASCILIC VEIN TRANSPOSITION Left 10/19/2012   Procedure: BASCILIC VEIN TRANSPOSITION;  Surgeon: Serafina Mitchell, MD;  Location: [place];  Service: Vascular;  Laterality: Left;  . CARDIAC CATHETERIZATION     "before bypass"  . CHOLECYSTECTOMY N/A 08/02/2018   Procedure: LAPAROSCOPIC CHOLECYSTECTOMY WITH INTRAOPERATIVE CHOLANGIOGRAM;  Surgeon: Donnie Mesa, MD;  Location: [place];  Service: General;  Laterality: N/A;  . CORONARY ARTERY BYPASS GRAFT     x 5 with lima at [place] SPACERS Left 08/07/2014   Procedure: Replace Left Total Knee Arthroplasty,  Place Antibiotic Spacer;  Surgeon: [name], MD;  Location: [place];  Service: Orthopedics;  Laterality: Left;  . I&D EXTREMITY Left 05/09/2014   Procedure: Irrigation and Debridement Left Knee and Closure of Total Knee Arthroplasty Incision;  Surgeon: [name], MD;  Location: [place];  Service: Orthopedics;  Laterality: Left;  . I&D KNEE WITH POLY EXCHANGE Left 05/31/2014   Procedure: IRRIGATION AND DEBRIDEMENT LEFT KNEE, PLACE ANTIBIOTIC BEADS,  POLY EXCHANGE;  Surgeon: [name], MD;  Location: [place];  Service: Orthopedics;  Laterality: Left;  . INGUINAL HERNIA REPAIR Bilateral 11/21/2018   Procedure: BILATERAL INGUINAL HERNIA REPAIR WITH MESH;  Surgeon: Coralie Keens, MD;  Location: [place];  Service: General;  Laterality: Bilateral;  . IRRIGATION AND DEBRIDEMENT KNEE Left 01/12/2017   Procedure: IRRIGATION AND DEBRIDEMENT LEFT KNEE;  Surgeon: [name], MD;  Location: [place];  Service: Orthopedics;  Laterality: Left;  . JOINT REPLACEMENT    . KNEE ARTHROSCOPY Left 08-25-2012  . LOWER EXTREMITY ANGIOGRAPHY Left 08/25/2016   Procedure: Lower Extremity Angiography;  Surgeon: Wellington Hampshire, MD;  Location: [place] [facility];  Service: Cardiovascular;  Laterality: Left;  . PERIPHERAL VASCULAR BALLOON ANGIOPLASTY Left 08/25/2016   Procedure: PERIPHERAL VASCULAR BALLOON ANGIOPLASTY;  Surgeon: Wellington Hampshire, MD;  Location: [place] [facility];  Service: Cardiovascular;  Laterality: Left;  lt peroneal and ant tibial arteries cutting balloon  . REFRACTIVE SURGERY Bilateral   . TOE AMPUTATION Bilateral    "I've lost 7 toes over the last 7 years" (05/09/2014)  . TOE SURGERY Left April 2015   Big toe removed on left foot.  . TONSILLECTOMY    . TOTAL KNEE ARTHROPLASTY Left 04/10/2014   Procedure: TOTAL KNEE ARTHROPLASTY;  Surgeon: [name], MD;  Location: [place];  Service: Orthopedics;  Laterality: Left;  . TOTAL KNEE REVISION Left 10/25/2014   Procedure: LEFT TOTAL KNEE REVISION;  Surgeon: [name], MD;  Location: [place];  Service: Orthopedics;  Laterality: Left;  . TOTAL KNEE REVISION Left 11/26/2015   Procedure: Removal Left Total Knee  Arthroplasty, Hinged Total Knee Arthroplasty;  Surgeon: [name], MD;  Location: [place];  Service: Orthopedics;  Laterality: Left;  . UVULOPALATOPHARYNGOPLASTY, TONSILLECTOMY AND SEPTOPLASTY  ~ 1989  . WOUND DEBRIDEMENT Left 05/09/2014   Dehiscence Left Total Knee Arthroplasty Incision     reports that he has been smoking cigarettes. He has a 3.84 pack-year smoking history. He has never used smokeless tobacco. He reports that he does not drink alcohol or use drugs.  Allergies  Allergen Reactions  . Morphine And Related Other (See Comments)    hallucinations  . Tygacil [Tigecycline] Nausea And Vomiting and Other (See Comments)  . Imodium [Loperamide] Itching    Family History  Problem Relation Age of Onset  . Hypertension Mother   . Cancer Mother 26       Ovarian  . Heart disease Maternal Aunt   . Stroke Maternal Grandfather     Prior to Admission medications   Medication Sig Start Date End Date Taking? Authorizing  Provider  acetaminophen (TYLENOL) 500 MG tablet Take 1,000 mg by mouth every 6 (six) hours as needed for mild pain or fever.    [provider]  amLODipine (NORVASC) 5 MG tablet Take 5 mg by mouth every evening.     [provider]  aspirin EC 81 MG EC tablet Take 2 tablets (162 mg total) by mouth daily. Patient taking differently: Take 81 mg by mouth at bedtime.  01/20/17   Doreatha Lew, MD  carvedilol (COREG) 12.5 MG tablet Take 1 tablet (12.5 mg total) by mouth 2 (two) times daily with a meal. 02/08/17   Bonnell Public, MD  Darbepoetin Alfa (ARANESP) 150 MCG/0.3ML SOSY injection Inject 0.3 mLs (150 mcg total) into the vein every Friday with hemodialysis. 02/11/17   Dana Allan I, MD  gabapentin (NEURONTIN) 100 MG capsule Take 200 mg by mouth every evening. 09/27/18   [provider]  heparin 1000 UNIT/ML injection Inject 4 mLs into the vein See admin instructions. Every hemodialysis treatment 08/14/18 08/13/19  [provider]  hydrOXYzine (ATARAX/VISTARIL) 50 MG tablet Take 50 mg by mouth 3 (three) times daily as needed for itching.     [provider]  montelukast (SINGULAIR) 10 MG tablet Take 10 mg by mouth at bedtime as needed. 08/14/18   [provider]  multivitamin (RENA-VIT) TABS tablet Take 1 tablet by mouth at bedtime. 02/08/17   Dana Allan I, MD  oxyCODONE (OXY IR/ROXICODONE) 5 MG immediate release tablet Take 1 tablet (5 mg total) by mouth every 6 (six) hours as needed for moderate pain, severe pain or breakthrough pain. 11/22/18   Coralie Keens, MD  sevelamer carbonate (RENVELA) 800 MG tablet Take 1,600-3,200 mg by mouth 3 (three) times daily with meals. 2-4 tabs with each meal    [provider]  temazepam (RESTORIL) 30 MG capsule Take 30 mg by mouth at bedtime.     [provider]    Physical Exam:  Constitutional: Middle-age male who appears to be in no acute distress but appears lethargic  Vitals:   11/24/18 1445 11/24/18 1500 11/24/18 1530 11/24/18 1545  BP: 115/70 109/71 115/70 115/67  Pulse: 91 89 88 86  Resp: 13 13 12 14   Temp:    99 F (37.2 C)  TempSrc:    Oral  SpO2: 91% 91%  92%   Eyes: PERRL, lids and conjunctivae normal ENMT: Mucous membranes are dry. Posterior pharynx clear of any exudate or lesions.  Neck: normal, supple,  no masses, no thyromegaly Respiratory: clear to auscultation bilaterally, no wheezing, no crackles. Normal respiratory effort. No accessory muscle use.  Cardiovascular: Regular rate and rhythm, no murmurs / rubs / gallops. No extremity edema. 2+ pedal pulses. No carotid bruits.  Fistula left upper extremity. Abdomen: no tenderness, healing inguinal hernia repair wounds with no significant erythema or drainage. Musculoskeletal: no clubbing / cyanosis.  Left AKA. Skin: no rashes, lesions, ulcers.  Neurologic: CN 2-12 grossly intact. Sensation intact, DTR normal. Strength 5/5 in all 4.  Psychiatric: Oriented to person, but not situation. Normal mood.     Labs on Admission: I have personally reviewed following labs and imaging studies  CBC: Recent Labs  Lab 11/21/18 0846 11/21/18 1027 11/24/18 1410  WBC 7.1  --  10.7*  NEUTROABS  --   --  8.9*  HGB 12.4* 11.9* 11.5*  HCT 40.8 35.0* 35.1*  MCV 96.2  --  90.9  PLT 173  --  025*   Basic Metabolic Panel: Recent Labs  Lab 11/21/18 1027 11/24/18 1410  NA 138 136  K 5.9* 4.5  CL 99 92*  CO2  --  27  GLUCOSE 132* 93  BUN 33* 19  CREATININE 5.90* 4.11*  CALCIUM  --  8.9   GFR: Estimated Creatinine Clearance: 18 mL/min (A) (by C-G formula based on SCr of 4.11 mg/dL (H)). Liver Function Tests: Recent Labs  Lab 11/24/18 1410  AST 18  ALT 5  ALKPHOS 154*  BILITOT 1.0  PROT 7.9  ALBUMIN 3.3*   No results for input(s): LIPASE, AMYLASE in the last 168 hours. No results for input(s): AMMONIA in the last 168 hours. Coagulation Profile: Recent Labs  Lab 11/24/18 1410  INR  1.2   Cardiac Enzymes: No results for input(s): CKTOTAL, CKMB, CKMBINDEX, TROPONINI in the last 168 hours. BNP (last 3 results) No results for input(s): PROBNP in the last 8760 hours. HbA1C: No results for input(s): HGBA1C in the last 72 hours. CBG: Recent Labs  Lab 11/21/18 0857 11/21/18 1242  GLUCAP 130* 128*   Lipid Profile: No results for input(s): CHOL, HDL, LDLCALC, TRIG, CHOLHDL, LDLDIRECT in the last 72 hours. Thyroid Function Tests: No results for input(s): TSH, T4TOTAL, FREET4, T3FREE, THYROIDAB in the last 72 hours. Anemia Panel: No results for input(s): VITAMINB12, FOLATE, FERRITIN, TIBC, IRON, RETICCTPCT in the last 72 hours. Urine analysis:    Component Value Date/Time   COLORURINE YELLOW 01/29/2017 1141   APPEARANCEUR CLOUDY (A) 01/29/2017 1141   APPEARANCEUR Clear 07/12/2013 0918   LABSPEC 1.011 01/29/2017 1141   PHURINE 8.0 01/29/2017 1141   GLUCOSEU 50 (A) 01/29/2017 1141   HGBUR SMALL (A) 01/29/2017 1141   BILIRUBINUR NEGATIVE 01/29/2017 1141   BILIRUBINUR Negative 07/12/2013 0918   KETONESUR NEGATIVE 01/29/2017 1141   PROTEINUR 100 (A) 01/29/2017 1141   UROBILINOGEN 0.2 01/03/2012 1335   NITRITE NEGATIVE 01/29/2017 1141   LEUKOCYTESUR LARGE (A) 01/29/2017 1141   LEUKOCYTESUR Negative 07/12/2013 0918   Sepsis Labs: Recent Results (from the past 240 hour(s))  Novel Coronavirus, NAA (Hosp order, Send-out to Ref Lab; TAT 18-24 hrs     Status: None   Collection Time: 11/17/18 12:21 PM   Specimen: Nasopharyngeal Swab; Respiratory  Result Value Ref Range Status   SARS-CoV-2, NAA NOT DETECTED NOT DETECTED Final    Comment: (NOTE) Testing was performed using the cobas(R) SARS-CoV-2 test. This nucleic acid amplification test was developed and its performance characteristics determined by [employer]. Nucleic acid amplification tests include PCR and  TMA. This test has not been FDA cleared or approved. This test has been authorized by FDA under an  Emergency Use Authorization (EUA). This test is only authorized for the duration of time the declaration that circumstances exist justifying the authorization of the emergency use of in vitro diagnostic tests for detection of SARS-CoV-2 virus and/or diagnosis of COVID-19 infection under section 564(b)(1) of the Act, 21 U.S.C. 568LEX-5(T) (1), unless the authorization is terminated or revoked sooner. When diagnostic testing is negative, the possibility of a false negative result should be considered in the context of a patient's recent exposures and the presence of clinical signs and symptoms consistent with COVID-19. An individual without s ymptoms of COVID- 19 and who is not shedding SARS-CoV-2 virus would expect to have a negative (not detected) result in this assay. Performed At: [facility] [address] [place], [place] [id number] Rush Farmer MD HQ:[id number]    [place]  Final    Comment: Performed at [facility], [place] [address] [place], [place] [address]     Radiological Exams on Admission: Dg Chest 2 View  Result Date: 11/24/2018 CLINICAL DATA:  Altered mental status after dialysis today.  Fever. EXAM: CHEST - 2 VIEW COMPARISON:  11/24/2018 and 1415 hours, 10/23/2018 and CT chest 09/01/2014. FINDINGS: Trachea is midline. Heart is enlarged. Thoracic aorta is calcified. There are streaky densities in the perihilar regions and medial aspects of both lower lobes. No dense airspace consolidation or pleural fluid. IMPRESSION: Streaky opacification in both perihilar regions may be due to atelectasis and/or scarring. Electronically Signed   By: Lorin Picket M.D.   On: 11/24/2018 15:29   Dg Chest Port 1 View  Result Date: 11/24/2018 CLINICAL DATA:  Altered mental status. EXAM: PORTABLE CHEST 1 VIEW COMPARISON:  10/23/2018 FINDINGS: 1417 hours. Low lung volumes. The cardio pericardial silhouette is enlarged. Interstitial markings are  diffusely coarsened with chronic features. Bilateral streaky parahilar opacities suggest atelectasis. Patchy opacity noted left lung base compatible with atelectasis or pneumonia. No substantial pleural effusion. Bones are diffusely demineralized. Telemetry leads overlie the chest. IMPRESSION: Low volume film with chronic underlying interstitial lung disease. Streaky bilateral parahilar opacity, likely atelectasis with some additional atelectasis or pneumonia left base. Repeat upright PA and lateral chest x-ray, when patient able, recommended for more thorough assessment. Electronically Signed   By: Misty Stanley M.D.   On: 11/24/2018 14:39    EKG: Independently reviewed.  Sinus rhythm at 90 bpm with QTc 485  Assessment/Plan Sepsis, unknown cause: Acute.  Patient presented with fever up to 100.6 F, respiration up to 23, and WBC 10.7.  Lactic acid was reassuring at 1.  Chest x-ray only noted signs of atelectasis and/or scarring.   -Admit to a medical telemetry bed -Follow-up urinalysis -Follow-up blood and urine cultures -Continue empiric antibiotics of vancomycin, metronidazole, and cefepime -De-escalate antibiotics when medically appropriate  Acute metabolic encephalopathy: Patient had recently taken oxycodone.  He appears significantly sleepy on physical exam.   -Check CT scan of the brain -Neurochecks -Holding oxycodone at this time  Status post inguinal hernia repair on 11/10 by Dr. Ninfa Linden: Incisional wounds appear to be healing well.  ESRD on hemodialysis: Patient normally dialyzes Monday, Wednesday, and Fridays.  He completed his full hemodialysis session today.  Labs note potassium 4.5, BUN 19, creatinine 4.11. -Nephrology aware of the patient being admitted  Essential hypertension: Blood pressures currently stable. -Continue amlodipine and Coreg  Diabetes mellitus type 2: Patient appears diet controlled.  Last hemoglobin A1c 5.7 on 9/22. -Continue renal and carb modified diet   Anemia of chronic disease: Hemoglobin 11.5 which appears near hemoglobin postop from his inguinal hernia repair. -Continue to monitor  CAD with history of CABG  Thrombocytopenia: Chronic.  Platelet count 131 on admission. -Continue to monitor  S/p left AKA   DVT prophylaxis: heparin   Code Status: full Family Communication: Discussed plan of care with the patient friend present at bedside Disposition Plan: To be determined Consults called: nephrology  Admission status: Inpatient  Norval Morton MD [facility] Pager [phone]   If 7PM-7AM, please contact night-coverage [email or web address] Password [facility]  11/24/2018, 5:44 PM

## 2018-11-24 NOTE — Progress Notes (Signed)
Pharmacy Antibiotic Note  Matthew Kline is a 58 y.o. male admitted on 11/24/2018 with sepsis. Pharmacy has been consulted for vancomycin and cefepime dosing. Pt with low grade fever of 100.6. Labs are pending. Pt with ESRD on HD. Completed HD today.   Plan: Vancomycin 1500mg  IV x 1 then 750mg  post-HD Cefepime 2gm IV x 1 then post each HD F/u renal plans, C&S, clinical status and pre-HD vanc level as needed     Temp (24hrs), Avg:100.6 F (38.1 C), Min:100.6 F (38.1 C), Max:100.6 F (38.1 C)  Recent Labs  Lab 11/21/18 0846 11/21/18 1027  WBC 7.1  --   CREATININE  --  5.90*    Estimated Creatinine Clearance: 12.5 mL/min (A) (by C-G formula based on SCr of 5.9 mg/dL (H)).    Allergies  Allergen Reactions  . Morphine And Related Other (See Comments)    hallucinations  . Tygacil [Tigecycline] Nausea And Vomiting and Other (See Comments)  . Imodium [Loperamide] Itching    Antimicrobials this admission: Vanc 11/13>> Cefepime 11/13>> Flagyl x 1 11/13  Dose adjustments this admission: N/A  Microbiology results: Pending  Thank you for allowing pharmacy to be a part of this patient's care.  Jalyah Weinheimer, Rande Lawman 11/24/2018 2:20 PM

## 2018-11-24 NOTE — ED Triage Notes (Addendum)
Pt here VIA EMS from dialysis (where he completed his treatment) for complaints of AMS.  Dialysis staff stated pt "seems out of it".  Pt recently had hernia surgery and is taking Oxycodone for pain.  Pt reported taking Oxy before going to dialysis. EMS administered 1 gram of tylenol PTA.

## 2018-11-24 NOTE — ED Provider Notes (Addendum)
[facility] EMERGENCY DEPARTMENT Provider Note   CSN: [id number] Arrival date & time: 11/24/18  1401     History   Chief Complaint Chief Complaint  Patient presents with  . Altered Mental Status  . Fever    HPI Matthew Kline is a 58 y.o. male history ESRD MWF, CHF, CAD, diabetes, CVA.  Patient presents today with altered mental status from dialysis center, level 5 caveat due to altered mental status.  EMS has left, submental history provided by nursing staff.  Patient seemed altered at dialysis center recent hernia surgery has been taking oxycodone for pain, noted to be febrile and tachycardic on EMS arrival given 1 g Tylenol by EMS. - On my initial evaluation patient drowsy easily arousable to voice.  Reports that he feels "high", when asked if he is in any pain patient reports "no man, do you have any".  Patient will not tell me how much oxycodone he has taken today but does report taking pain medicine at home.  He denies any pain currently and has no specific complaints.  He is alert to self and place.     HPI  Past Medical History:  Diagnosis Date  . Anemia, unspecified   . Anxiety   . Arthralgia 2010   polyarticular  . Arthritis    "back, knees" (01/10/2017)  . Cancer (HCC)    "kidney area" (01/10/2017)  . CHF (congestive heart failure) (HCC) 07/25/2009   denies  . Chronic lower back pain   . Coronary artery disease   . Coughing    pt. reports that he has drainage from sinus infection  . Diabetic foot ulcer (HCC)   . Diabetic neuropathy (HCC)   . ESRD (end stage renal disease) on dialysis (HCC)    started 12/2012; "MWF; [facility] "  (01/10/2017)  . GERD (gastroesophageal reflux disease)    hx "before I lost weight", no problem 9 years  . Hemodialysis access site with mature fistula (HCC)   . Hemorrhoids, internal 10/2011   small  . High cholesterol   . History of blood transfusion    "related to the anemia"  . Hypertension   .  Insomnia, unspecified   . Lacunar infarction (HCC) 2006   RUE/RLE, speech  . Long term (current) use of anticoagulants   . Myocardial infarction (HCC) 1995  . Orthostatic hypotension   . Osteomyelitis of foot, left, acute (HCC)   . Other chronic postoperative pain   . Pneumonia    "probably twice" (01/10/2017)  . Polymyalgia rheumatica (HCC)   . Renal insufficiency   . Sleep apnea    "lost weight; no more problem" (01/10/2017)  . Stroke (HCC) 01/10/06   denies residual on 05/09/2014  . Type II diabetes mellitus (HCC) dx'd 1995  . Unspecified hereditary and idiopathic peripheral neuropathy    feet  . Unspecified osteomyelitis, site unspecified   . Unspecified vitamin D deficiency     Patient Active Problem List   Diagnosis Date Noted  . Bilateral inguinal hernia 11/21/2018  . Biliary colic 07/31/2018  . Acute cholecystitis 07/31/2018  . Prolonged QT interval 07/31/2018  . Acute respiratory failure with hypoxia (HCC) 07/10/2018  . Abdominal pain 07/10/2018  . Bilateral recurrent inguinal hernia without obstruction or gangrene   . Altered mental status   . Cerebral thrombosis with cerebral infarction 02/06/2017  . Cerebral embolism with cerebral infarction 02/06/2017  . Hx of AKA (above knee amputation), left (HCC)   . Pressure injury of   skin 01/30/2017  . Acute lower UTI 01/29/2017  . Acute metabolic encephalopathy 01/29/2017  . UTI (urinary tract infection) 01/29/2017  . Quadriceps muscle rupture, left, initial encounter   . Fall 01/09/2017  . Gait disturbance 01/09/2017  . Staphylococcus aureus infection 01/09/2017  . Infection of prosthetic left knee joint (HCC) 01/09/2017  . Fever, unknown origin 11/09/2016  . Critical lower limb ischemia 08/25/2016  . Ulcer of left midfoot with fat layer exposed (HCC) 08/13/2016  . Diabetic ulcer of left midfoot associated with type 2 diabetes mellitus, with fat layer exposed (HCC) 07/25/2016  . Peripheral neuropathy 07/22/2016  .  Tobacco abuse 07/22/2016  . CAD in native artery 05/18/2016  . CAD, multiple vessel 05/11/2016  . Positive cardiac stress test 05/11/2016  . Abnormal stress test 04/30/2016  . Pre-transplant evaluation for kidney transplant 04/30/2016  . S/P revision of total knee 11/26/2015  . Pain in the chest   . Acute on chronic diastolic heart failure (HCC) 07/05/2015  . Volume overload 07/04/2015  . Shortness of breath 07/04/2015  . Hypoxemia 07/04/2015  . Elevated troponin   . End-stage renal disease on hemodialysis (HCC)   . Hypervolemia   . 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 Pyogenic bacterial arthritis of knee, left (HCC) 08/07/2014  . Tachycardia 07/24/2014  . Acute upper respiratory infection 07/24/2014  . ESRD on dialysis (HCC) 07/14/2014  . Type II diabetes mellitus (HCC) 07/14/2014  . Anemia in chronic kidney disease 07/14/2014  . Congestive heart failure (CHF) (HCC) 07/13/2014  . Surgical wound dehiscence 05/09/2014  . Dehiscence of closure of skin 05/09/2014  . Total knee replacement status 04/10/2014  . Diabetes mellitus with renal manifestations, controlled (HCC) 10/24/2013  . Hypertensive renal disease 06/27/2013  . DM type 2 causing vascular disease (HCC) 06/27/2013  . Erectile dysfunction 06/27/2013  . Depression 06/27/2013  . Claudication of left lower extremity (HCC) 12/19/2012  . Essential hypertension, benign 12/19/2012  . Sinusitis, acute maxillary 11/22/2012  . Otitis, externa, infective 11/14/2012  . Leg edema, left 11/14/2012  . End stage renal disease (HCC) 10/02/2012  . Controlled type 2 DM with proteinuria or microalbuminuria 09/19/2012  . GERD (gastroesophageal reflux disease) 09/19/2012  . Leukocytosis 09/19/2012  . Lacunar infarction (HCC) 08/17/2012  . Polymyalgia rheumatica (HCC) 08/17/2012  . 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 Essential hypertension 05/10/2012  . Vitamin D deficiency 05/10/2012  . Diabetes mellitus due to  underlying condition (HCC) 05/10/2012  . Hyperlipidemia LDL goal <100 05/10/2012  . Anemia of chronic disease 05/10/2012  . Screening for prostate cancer 05/10/2012  . Chronic kidney disease (CKD), stage IV (severe) (HCC) 05/10/2012  . Peripheral autonomic neuropathy due to DM (HCC) 05/10/2012  . Callus of foot 05/10/2012  . Urgency of urination 05/10/2012  . Hyperkalemia 05/10/2012  . Candidiasis of the esophagus 10/12/2011  . Internal hemorrhoids without mention of complication 10/12/2011  . Pre-syncope 07/25/2009  . DJD (degenerative joint disease) of cervical spine 02/17/2009    Past Surgical History:  Procedure Laterality Date  . ABDOMINAL AORTOGRAM N/A 08/25/2016   Procedure: ABDOMINAL AORTOGRAM;  Surgeon: Arida, Muhammad A, MD;  Location: [facility];  Service: Cardiovascular;  Laterality: N/A;  . AMPUTATION  01/21/2012   Procedure: AMPUTATION RAY;  Surgeon: Marcus V Duda, MD;  Location: [facility] OR;  Service: Orthopedics;  Laterality: Left;  Left Foot 4th Ray Amputation  . AMPUTATION Left 05/04/2013   Procedure: AMPUTATION DIGIT;  Surgeon: Marcus V Duda, MD;  Location: [facility] OR;    Service: Orthopedics;  Laterality: Left;  Left Great Toe Amputation at [facility]  . AMPUTATION Left 01/14/2017   Procedure: AMPUTATION ABOVE LEFT KNEE;  Surgeon: [name], MD;  Location: [place];  Service: Orthopedics;  Laterality: Left;  . ANTERIOR CERVICAL DECOMP/DISCECTOMY FUSION  02/2011  . BACK SURGERY    . BASCILIC VEIN TRANSPOSITION Left 10/19/2012   Procedure: BASCILIC VEIN TRANSPOSITION;  Surgeon: Serafina Mitchell, MD;  Location: [place];  Service: Vascular;  Laterality: Left;  . CARDIAC CATHETERIZATION     "before bypass"  . CHOLECYSTECTOMY N/A 08/02/2018   Procedure: LAPAROSCOPIC CHOLECYSTECTOMY WITH INTRAOPERATIVE CHOLANGIOGRAM;  Surgeon: Donnie Mesa, MD;  Location: [place];  Service: General;  Laterality: N/A;  . CORONARY ARTERY BYPASS GRAFT     x 5 with lima at [place] SPACERS Left 08/07/2014   Procedure: Replace Left Total Knee Arthroplasty,  Place Antibiotic Spacer;  Surgeon: [name], MD;  Location: [place];  Service: Orthopedics;  Laterality: Left;  . I&D EXTREMITY Left 05/09/2014   Procedure: Irrigation and Debridement Left Knee and Closure of Total Knee Arthroplasty Incision;  Surgeon: [name], MD;  Location: [place];  Service: Orthopedics;  Laterality: Left;  . I&D KNEE WITH POLY EXCHANGE Left 05/31/2014   Procedure: IRRIGATION AND DEBRIDEMENT LEFT KNEE, PLACE ANTIBIOTIC BEADS,  POLY EXCHANGE;  Surgeon: [name], MD;  Location: [place];  Service: Orthopedics;  Laterality: Left;  . INGUINAL HERNIA REPAIR Bilateral 11/21/2018   Procedure: BILATERAL INGUINAL HERNIA REPAIR WITH MESH;  Surgeon: Coralie Keens, MD;  Location: [place];  Service: General;  Laterality: Bilateral;  . IRRIGATION AND DEBRIDEMENT KNEE Left 01/12/2017   Procedure: IRRIGATION AND DEBRIDEMENT LEFT KNEE;  Surgeon: [name], MD;  Location: [place];  Service: Orthopedics;  Laterality: Left;  . JOINT REPLACEMENT    . KNEE ARTHROSCOPY Left 08-25-2012  . LOWER EXTREMITY ANGIOGRAPHY Left 08/25/2016   Procedure: Lower Extremity Angiography;  Surgeon: Wellington Hampshire, MD;  Location: [place] CV LAB;  Service: Cardiovascular;  Laterality: Left;  . PERIPHERAL VASCULAR BALLOON ANGIOPLASTY Left 08/25/2016   Procedure: PERIPHERAL VASCULAR BALLOON ANGIOPLASTY;  Surgeon: Wellington Hampshire, MD;  Location: [place] CV LAB;  Service: Cardiovascular;  Laterality: Left;  lt peroneal and ant tibial arteries cutting balloon  . REFRACTIVE SURGERY Bilateral   . TOE AMPUTATION Bilateral    "I've lost 7 toes over the last 7 years" (05/09/2014)  . TOE SURGERY Left April 2015   Big toe removed on left foot.  . TONSILLECTOMY    . TOTAL KNEE ARTHROPLASTY Left 04/10/2014   Procedure: TOTAL KNEE ARTHROPLASTY;  Surgeon: [name], MD;  Location: [place];  Service:  Orthopedics;  Laterality: Left;  . TOTAL KNEE REVISION Left 10/25/2014   Procedure: LEFT TOTAL KNEE REVISION;  Surgeon: [name], MD;  Location: [place];  Service: Orthopedics;  Laterality: Left;  . TOTAL KNEE REVISION Left 11/26/2015   Procedure: Removal Left Total Knee Arthroplasty, Hinged Total Knee Arthroplasty;  Surgeon: [name], MD;  Location: [place];  Service: Orthopedics;  Laterality: Left;  . UVULOPALATOPHARYNGOPLASTY, TONSILLECTOMY AND SEPTOPLASTY  ~ 1989  . WOUND DEBRIDEMENT Left 05/09/2014   Dehiscence Left Total Knee Arthroplasty Incision        Home Medications    Prior to Admission medications   Medication Sig Start Date End Date Taking? Authorizing Provider  acetaminophen (TYLENOL) 500 MG tablet Take 1,000 mg  by mouth every 6 (six) hours as needed for mild pain or fever.    [provider]  amLODipine (NORVASC) 5 MG tablet Take 5 mg by mouth every evening.     [provider]  aspirin EC 81 MG EC tablet Take 2 tablets (162 mg total) by mouth daily. Patient taking differently: Take 81 mg by mouth at bedtime.  01/20/17   Silva Zapata, Edwin, MD  carvedilol (COREG) 12.5 MG tablet Take 1 tablet (12.5 mg total) by mouth 2 (two) times daily with a meal. 02/08/17   Ogbata, Sylvester I, MD  Darbepoetin Alfa (ARANESP) 150 MCG/0.3ML SOSY injection Inject 0.3 mLs (150 mcg total) into the vein every Friday with hemodialysis. 02/11/17   Ogbata, Sylvester I, MD  gabapentin (NEURONTIN) 100 MG capsule Take 200 mg by mouth every evening. 09/27/18   [provider]  heparin 1000 UNIT/ML injection Inject 4 mLs into the vein See admin instructions. Every hemodialysis treatment 08/14/18 08/13/19  [provider]  hydrOXYzine (ATARAX/VISTARIL) 50 MG tablet Take 50 mg by mouth 3 (three) times daily as needed for itching.     [provider]  montelukast (SINGULAIR) 10 MG tablet Take 10 mg by mouth at bedtime as needed. 08/14/18   [provider]   multivitamin (RENA-VIT) TABS tablet Take 1 tablet by mouth at bedtime. 02/08/17   Ogbata, Sylvester I, MD  oxyCODONE (OXY IR/ROXICODONE) 5 MG immediate release tablet Take 1 tablet (5 mg total) by mouth every 6 (six) hours as needed for moderate pain, severe pain or breakthrough pain. 11/22/18   Blackman, Douglas, MD  sevelamer carbonate (RENVELA) 800 MG tablet Take 1,600-3,200 mg by mouth 3 (three) times daily with meals. 2-4 tabs with each meal    [provider]  temazepam (RESTORIL) 30 MG capsule Take 30 mg by mouth at bedtime.     [provider]    Family History Family History  Problem Relation Age of Onset  . Hypertension Mother   . Cancer Mother 33       Ovarian  . Heart disease Maternal Aunt   . Stroke Maternal Grandfather     Social History Social History   Tobacco Use  . Smoking status: Current Some Day Smoker    Packs/day: 0.12    Years: 32.00    Pack years: 3.84    Types: Cigarettes  . Smokeless tobacco: Never Used  . Tobacco comment: 1 per week  Substance Use Topics  . Alcohol use: No    Alcohol/week: 0.0 standard drinks  . Drug use: No     Allergies   Morphine and related, Tygacil [tigecycline], and Imodium [loperamide]   Review of Systems Review of Systems Ten systems are reviewed and are negative for acute change except as noted in the HPI   Physical Exam Updated Vital Signs BP 115/67   Pulse 86   Temp 99 F (37.2 C) (Oral)   Resp 14   SpO2 92%   Physical Exam Constitutional:      General: He is not in acute distress.    Appearance: Normal appearance. He is well-developed. He is not ill-appearing or diaphoretic.  HENT:     Head: Normocephalic and atraumatic.     Right Ear: External ear normal.     Left Ear: External ear normal.     Nose: Nose normal.  Eyes:     General: Vision grossly intact. Gaze aligned appropriately.     Pupils: Pupils are equal, round, and reactive   to light.  Neck:     Musculoskeletal: Normal  range of motion.     Trachea: Trachea and phonation normal. No tracheal deviation.  Cardiovascular:     Rate and Rhythm: Tachycardia present.  Pulmonary:     Effort: Pulmonary effort is normal. No respiratory distress.     Comments: Coarse Abdominal:     General: There is no distension.     Palpations: Abdomen is soft.     Tenderness: There is no abdominal tenderness. There is no guarding or rebound.     Comments: Well-healed surgical site bilateral inguinal  Musculoskeletal: Normal range of motion.     Comments: Left BKA  Skin:    General: Skin is warm and dry.  Neurological:     Mental Status: He is confused.     GCS: GCS eye subscore is 4. GCS verbal subscore is 4. GCS motor subscore is 6.     Comments: Speech is clear and goal oriented, follows commands Major Cranial nerves without deficit, no facial droop Moves extremities without ataxia, coordination intact  Psychiatric:        Behavior: Behavior normal.      ED Treatments / Results  Labs (all labs ordered are listed, but only abnormal results are displayed) Labs Reviewed  COMPREHENSIVE METABOLIC PANEL - Abnormal; Notable for the following components:      Result Value   Chloride 92 (*)    Creatinine, Ser 4.11 (*)    Albumin 3.3 (*)    Alkaline Phosphatase 154 (*)    GFR calc non Af Amer 15 (*)    GFR calc Af Amer 17 (*)    Anion gap 17 (*)    All other components within normal limits  CBC WITH DIFFERENTIAL/PLATELET - Abnormal; Notable for the following components:   WBC 10.7 (*)    RBC 3.86 (*)    Hemoglobin 11.5 (*)    HCT 35.1 (*)    Platelets 131 (*)    Neutro Abs 8.9 (*)    Lymphs Abs 0.4 (*)    Monocytes Absolute 1.1 (*)    All other components within normal limits  CULTURE, BLOOD (ROUTINE X 2)  CULTURE, BLOOD (ROUTINE X 2)  URINE CULTURE  SARS CORONAVIRUS 2 (TAT 6-24 HRS)  LACTIC ACID, PLASMA  APTT  PROTIME-INR  LACTIC ACID, PLASMA  URINALYSIS, ROUTINE W REFLEX MICROSCOPIC    EKG EKG  Interpretation  Date/Time:  Friday November 24 2018 14:56:55 EST Ventricular Rate:  90 PR Interval:    QRS Duration: 96 QT Interval:  396 QTC Calculation: 485 R Axis:   27 Text Interpretation: Sinus rhythm Prolonged PR interval Probable left atrial enlargement Abnormal T, consider ischemia, lateral leads Confirmed by Messick, Peter ([id number]) on 11/24/2018 3:25:20 PM   Radiology Dg Chest 2 View  Result Date: 11/24/2018 CLINICAL DATA:  Altered mental status after dialysis today.  Fever. EXAM: CHEST - 2 VIEW COMPARISON:  11/24/2018 and 1415 hours, 10/23/2018 and CT chest 09/01/2014. FINDINGS: Trachea is midline. Heart is enlarged. Thoracic aorta is calcified. There are streaky densities in the perihilar regions and medial aspects of both lower lobes. No dense airspace consolidation or pleural fluid. IMPRESSION: Streaky opacification in both perihilar regions may be due to atelectasis and/or scarring. Electronically Signed   By: Melinda  Blietz M.D.   On: 11/24/2018 15:29   Dg Chest Port 1 View  Result Date: 11/24/2018 CLINICAL DATA:  Altered mental status. EXAM: PORTABLE CHEST 1 VIEW COMPARISON:  10/23/2018 FINDINGS: 1417 hours.   Low lung volumes. The cardio pericardial silhouette is enlarged. Interstitial markings are diffusely coarsened with chronic features. Bilateral streaky parahilar opacities suggest atelectasis. Patchy opacity noted left lung base compatible with atelectasis or pneumonia. No substantial pleural effusion. Bones are diffusely demineralized. Telemetry leads overlie the chest. IMPRESSION: Low volume film with chronic underlying interstitial lung disease. Streaky bilateral parahilar opacity, likely atelectasis with some additional atelectasis or pneumonia left base. Repeat upright PA and lateral chest x-ray, when patient able, recommended for more thorough assessment. Electronically Signed   By: Eric  Mansell M.D.   On: 11/24/2018 14:39    Procedures .Critical Care Performed  by: Morelli, Brandon A, PA-C Authorized by: Morelli, Brandon A, PA-C   Critical care provider statement:    Critical care time (minutes):  32   Critical care was necessary to treat or prevent imminent or life-threatening deterioration of the following conditions:  Sepsis   Critical care was time spent personally by me on the following activities:  Discussions with consultants, evaluation of patient's response to treatment, examination of patient, ordering and performing treatments and interventions, ordering and review of laboratory studies, ordering and review of radiographic studies, pulse oximetry, re-evaluation of patient's condition, obtaining history from patient or surrogate, review of old charts and development of treatment plan with patient or surrogate   (including critical care time)  Medications Ordered in ED Medications  metroNIDAZOLE (FLAGYL) IVPB 500 mg (500 mg Intravenous New Bag/Given 11/24/18 1606)  vancomycin (VANCOCIN) 1,500 mg in sodium chloride 0.9 % 500 mL IVPB (1,500 mg Intravenous New Bag/Given 11/24/18 1602)  vancomycin (VANCOCIN) IVPB 750 mg/150 ml premix (has no administration in time range)  ceFEPIme (MAXIPIME) 2 g in sodium chloride 0.9 % 100 mL IVPB (has no administration in time range)  ceFEPIme (MAXIPIME) 2 g in sodium chloride 0.9 % 100 mL IVPB (0 g Intravenous Stopped 11/24/18 1554)     Initial Impression / Assessment and Plan / ED Course  I have reviewed the triage vital signs and the nursing notes.  Pertinent labs & imaging results that were available during my care of the patient were reviewed by me and considered in my medical decision making (see chart for details).    Patient arrives confused, febrile, tachycardic, recent inguinal hernia surgery.  No cranial nerve deficit, moves bilateral upper extremities with equal strength, follows commands, left BKA, right lower extremity with appropriate strength.  Surgical sites appear well-healing without  signs of infection.  No abdominal pain or peritoneal signs.  Hypoxic high 80s on room air placed on 2 L nasal cannula with improvement.  From dialysis center today.  Broad differential at this time including infection, uremia, narcotic overuse.  Code sepsis initiated with broad-spectrum antibiotics cefepime, vancomycin, Flagyl.  Will need to avoid fluid overload in this dialysis patient, no hypotension at this time.  Case discussed with Dr. Messick who agrees with care plan. - EKG: Sinus rhythm Prolonged PR interval Probable left atrial enlargement Abnormal T, consider ischemia, lateral leads Confirmed by Messick, Peter ([id number]) on 11/24/2018 3:25:20 PM  CBC with leukocytosis of 10.7 with left shift, hemoglobin baseline PT/INR within normal limits APTT within normal limits  Initial CXR:  IMPRESSION:  Low volume film with chronic underlying interstitial lung disease.    Streaky bilateral parahilar opacity, likely atelectasis with some  additional atelectasis or pneumonia left base. Repeat upright PA and  lateral chest x-ray, when patient able, recommended for more  thorough assessment.   Lactic 1.0 CMP with creatinine 4.1, BUN within   normal limits, alk phos 154 - CXR:  IMPRESSION:  Streaky opacification in both perihilar regions may be due to  atelectasis and/or scarring.  - 4:20 PM: Patient reevaluated resting comfortably no acute distress denies any pain reports that he is feeling "alright".  Church elder at bedside with him.  Patient is now less confused, alert and oriented x3, states understanding of care plan and is agreeable for admission. - Awaiting consult to unassigned admitting team.  Care handoff given to Janetta Hora PA-C, plan of care is to consult for admission.  Note: Portions of this report may have been transcribed using voice recognition software. Every effort was made to ensure accuracy; however, inadvertent computerized transcription errors may still be present.  Final Clinical Impressions(s) / ED Diagnoses   Final diagnoses:  Transient confusion  Sepsis without acute organ dysfunction, due to unspecified organism [facility])    ED Discharge Orders    None       Gari Crown 11/24/18 1648    Valarie Merino, MD 11/25/18 1153    Deliah Boston, [place] 12/05/18 1719    Valarie Merino, MD 12/11/18 1138

## 2018-11-24 NOTE — ED Notes (Signed)
Patient transported to X-ray 

## 2018-11-25 DIAGNOSIS — G9341 Metabolic encephalopathy: Secondary | ICD-10-CM | POA: Diagnosis not present

## 2018-11-25 DIAGNOSIS — K4020 Bilateral inguinal hernia, without obstruction or gangrene, not specified as recurrent: Secondary | ICD-10-CM | POA: Diagnosis not present

## 2018-11-25 DIAGNOSIS — A419 Sepsis, unspecified organism: Secondary | ICD-10-CM | POA: Diagnosis not present

## 2018-11-25 DIAGNOSIS — D638 Anemia in other chronic diseases classified elsewhere: Secondary | ICD-10-CM | POA: Diagnosis not present

## 2018-11-25 LAB — CBC
HCT: 31 % — ABNORMAL LOW (ref 39.0–52.0)
Hemoglobin: 10.1 g/dL — ABNORMAL LOW (ref 13.0–17.0)
MCH: 30 pg (ref 26.0–34.0)
MCHC: 32.6 g/dL (ref 30.0–36.0)
MCV: 92 fL (ref 80.0–100.0)
Platelets: 135 10*3/uL — ABNORMAL LOW (ref 150–400)
RBC: 3.37 MIL/uL — ABNORMAL LOW (ref 4.22–5.81)
RDW: 13.8 % (ref 11.5–15.5)
WBC: 12.2 10*3/uL — ABNORMAL HIGH (ref 4.0–10.5)
nRBC: 0 % (ref 0.0–0.2)

## 2018-11-25 LAB — RENAL FUNCTION PANEL
Albumin: 2.8 g/dL — ABNORMAL LOW (ref 3.5–5.0)
Anion gap: 16 — ABNORMAL HIGH (ref 5–15)
BUN: 27 mg/dL — ABNORMAL HIGH (ref 6–20)
CO2: 27 mmol/L (ref 22–32)
Calcium: 9.2 mg/dL (ref 8.9–10.3)
Chloride: 91 mmol/L — ABNORMAL LOW (ref 98–111)
Creatinine, Ser: 5.15 mg/dL — ABNORMAL HIGH (ref 0.61–1.24)
GFR calc Af Amer: 13 mL/min — ABNORMAL LOW (ref >60)
GFR calc non Af Amer: 11 mL/min — ABNORMAL LOW (ref >60)
Glucose, Bld: 89 mg/dL (ref 70–99)
Phosphorus: 6.9 mg/dL — ABNORMAL HIGH (ref 2.5–4.6)
Potassium: 4.8 mmol/L (ref 3.5–5.1)
Sodium: 134 mmol/L — ABNORMAL LOW (ref 135–145)

## 2018-11-25 LAB — GLUCOSE, CAPILLARY
Glucose-Capillary: 83 mg/dL (ref 70–99)
Glucose-Capillary: 116 mg/dL — ABNORMAL HIGH (ref 70–99)
Glucose-Capillary: 151 mg/dL — ABNORMAL HIGH (ref 70–99)
Glucose-Capillary: 138 mg/dL — ABNORMAL HIGH (ref 70–99)

## 2018-11-25 MED ORDER — ATORVASTATIN CALCIUM 40 MG PO TABS
40.0000 mg | ORAL_TABLET | Freq: Every day | ORAL | Status: DC
  Administered 2018-11-25 – 2018-11-26 (×2): 40 mg via ORAL
  Filled 2018-11-25 (×2): qty 1

## 2018-11-25 MED ORDER — LABETALOL HCL 5 MG/ML IV SOLN: 10.0000 mg | INTRAVENOUS | Status: DC | PRN

## 2018-11-25 MED ORDER — DOXERCALCIFEROL 4 MCG/2ML IV SOLN
3.0000 ug | INTRAVENOUS | Status: DC
  Filled 2018-11-25: qty 2

## 2018-11-25 NOTE — Consult Note (Signed)
Renal Service Consult Note [facility]  [facility]line 11/25/2018 [name] Requesting Physician:  Dr Denton Brick, C  Reason for Consult:  ESRD pt w/ AMS HPI: The patient is a 58 y.o. year-old with hx of IDDM, CVA, PAD w/ L AKA, CAD hx CABGx5, sp L TKA, HTN, ESRD on HD, chronic back pain, CHF sent from HD (after Rx) to ED for concern of AMS.  Recent bilat inguinal hernia surgery, taking pain meds.  In ED temp was 100.6, WBC 10k, Hb 11, lytes okay. BP's 110/71.  CXR showed atx. Blood cx's were drawn and IV abx started and pt admitted. We are asked to see for ESRD.    Patient is in good spirits, confused about why is in here, no CP / SOB, abd pain , diarrhea.  "got my full dialysis yesterday".   ROS  denies CP  no joint pain   no HA  no blurry vision  no rash  no diarrhea  no nausea/ vomiting   Past Medical History  Past Medical History:  Diagnosis Date  . Anemia, unspecified   . Anxiety   . Arthralgia 2010   polyarticular  . Arthritis    "back, knees" (01/10/2017)  . Cancer [facility])    "kidney area" (01/10/2017)  . CHF (congestive heart failure) ([place]) 07/25/2009   denies  . Chronic lower back pain   . Coronary artery disease   . Coughing    pt. reports that he has drainage from sinus infection  . Diabetic foot ulcer ([place])   . Diabetic neuropathy ([place])   . ESRD (end stage renal disease) on dialysis [facility])    started 12/2012; "MWF; [place] "  (01/10/2017)  . GERD (gastroesophageal reflux disease)    hx "before I lost weight", no problem 9 years  . Hemodialysis access site with mature fistula ([place])   . Hemorrhoids, internal 10/2011   small  . High cholesterol   . History of blood transfusion    "related to the anemia"  . Hypertension   . Insomnia, unspecified   . Lacunar infarction ([place]) 2006   RUE/RLE, speech  . Long term (current) use of anticoagulants   . Myocardial infarction ([place]) 1995  . Orthostatic hypotension   . Osteomyelitis  of foot, left, acute ([place])   . Other chronic postoperative pain   . Pneumonia    "probably twice" (01/10/2017)  . Polymyalgia rheumatica ([place])   . Renal insufficiency   . Sleep apnea    "lost weight; no more problem" (01/10/2017)  . Stroke ([place]) 01/10/06   denies residual on 05/09/2014  . Type II diabetes mellitus ([place]) dx'd 1995  . Unspecified hereditary and idiopathic peripheral neuropathy    feet  . Unspecified osteomyelitis, site unspecified   . Unspecified vitamin D deficiency    Past Surgical History  Past Surgical History:  Procedure Laterality Date  . ABDOMINAL AORTOGRAM N/A 08/25/2016   Procedure: ABDOMINAL AORTOGRAM;  Surgeon: Wellington Hampshire, MD;  Location: [place] [facility];  Service: Cardiovascular;  Laterality: N/A;  . AMPUTATION  01/21/2012   Procedure: AMPUTATION RAY;  Surgeon: [name], MD;  Location: [place];  Service: Orthopedics;  Laterality: Left;  Left Foot 4th Ray Amputation  . AMPUTATION Left 05/04/2013   Procedure: AMPUTATION DIGIT;  Surgeon: [name], MD;  Location: [place];  Service: Orthopedics;  Laterality: Left;  Left Great Toe Amputation at [facility]  . AMPUTATION Left 01/14/2017   Procedure: AMPUTATION ABOVE LEFT  KNEE;  Surgeon: [name], MD;  Location: [place];  Service: Orthopedics;  Laterality: Left;  . ANTERIOR CERVICAL DECOMP/DISCECTOMY FUSION  02/2011  . BACK SURGERY    . BASCILIC VEIN TRANSPOSITION Left 10/19/2012   Procedure: BASCILIC VEIN TRANSPOSITION;  Surgeon: Serafina Mitchell, MD;  Location: [place];  Service: Vascular;  Laterality: Left;  . CARDIAC CATHETERIZATION     "before bypass"  . CHOLECYSTECTOMY N/A 08/02/2018   Procedure: LAPAROSCOPIC CHOLECYSTECTOMY WITH INTRAOPERATIVE CHOLANGIOGRAM;  Surgeon: Donnie Mesa, MD;  Location: [place];  Service: General;  Laterality: N/A;  . CORONARY ARTERY BYPASS GRAFT     x 5 with lima at [place] SPACERS Left 08/07/2014   Procedure: Replace  Left Total Knee Arthroplasty,  Place Antibiotic Spacer;  Surgeon: [name], MD;  Location: [place];  Service: Orthopedics;  Laterality: Left;  . I&D EXTREMITY Left 05/09/2014   Procedure: Irrigation and Debridement Left Knee and Closure of Total Knee Arthroplasty Incision;  Surgeon: [name], MD;  Location: [place];  Service: Orthopedics;  Laterality: Left;  . I&D KNEE WITH POLY EXCHANGE Left 05/31/2014   Procedure: IRRIGATION AND DEBRIDEMENT LEFT KNEE, PLACE ANTIBIOTIC BEADS,  POLY EXCHANGE;  Surgeon: [name], MD;  Location: [place];  Service: Orthopedics;  Laterality: Left;  . INGUINAL HERNIA REPAIR Bilateral 11/21/2018   Procedure: BILATERAL INGUINAL HERNIA REPAIR WITH MESH;  Surgeon: Coralie Keens, MD;  Location: [place];  Service: General;  Laterality: Bilateral;  . IRRIGATION AND DEBRIDEMENT KNEE Left 01/12/2017   Procedure: IRRIGATION AND DEBRIDEMENT LEFT KNEE;  Surgeon: [name], MD;  Location: [place];  Service: Orthopedics;  Laterality: Left;  . JOINT REPLACEMENT    . KNEE ARTHROSCOPY Left 08-25-2012  . LOWER EXTREMITY ANGIOGRAPHY Left 08/25/2016   Procedure: Lower Extremity Angiography;  Surgeon: Wellington Hampshire, MD;  Location: [place] [facility];  Service: Cardiovascular;  Laterality: Left;  . PERIPHERAL VASCULAR BALLOON ANGIOPLASTY Left 08/25/2016   Procedure: PERIPHERAL VASCULAR BALLOON ANGIOPLASTY;  Surgeon: Wellington Hampshire, MD;  Location: [place] [facility];  Service: Cardiovascular;  Laterality: Left;  lt peroneal and ant tibial arteries cutting balloon  . REFRACTIVE SURGERY Bilateral   . TOE AMPUTATION Bilateral    "I've lost 7 toes over the last 7 years" (05/09/2014)  . TOE SURGERY Left April 2015   Big toe removed on left foot.  . TONSILLECTOMY    . TOTAL KNEE ARTHROPLASTY Left 04/10/2014   Procedure: TOTAL KNEE ARTHROPLASTY;  Surgeon: [name], MD;  Location: [place];  Service: Orthopedics;  Laterality: Left;  . TOTAL KNEE REVISION Left 10/25/2014    Procedure: LEFT TOTAL KNEE REVISION;  Surgeon: [name], MD;  Location: [place];  Service: Orthopedics;  Laterality: Left;  . TOTAL KNEE REVISION Left 11/26/2015   Procedure: Removal Left Total Knee Arthroplasty, Hinged Total Knee Arthroplasty;  Surgeon: [name], MD;  Location: [place];  Service: Orthopedics;  Laterality: Left;  . UVULOPALATOPHARYNGOPLASTY, TONSILLECTOMY AND SEPTOPLASTY  ~ 1989  . WOUND DEBRIDEMENT Left 05/09/2014   Dehiscence Left Total Knee Arthroplasty Incision   Family History  Family History  Problem Relation Age of Onset  . Hypertension Mother   . Cancer Mother 55       Ovarian  . Heart disease Maternal Aunt   . Stroke Maternal Grandfather    Social History  reports that he has been smoking cigarettes. He has a 3.84  pack-year smoking history. He has never used smokeless tobacco. He reports that he does not drink alcohol or use drugs. Allergies  Allergies  Allergen Reactions  . Morphine And Related Other (See Comments)    hallucinations  . Tygacil [Tigecycline] Nausea And Vomiting and Other (See Comments)  . Imodium [Loperamide] Itching   Home medications Prior to Admission medications   Medication Sig Start Date End Date Taking? Authorizing Provider  acetaminophen (TYLENOL) 500 MG tablet Take 500-1,000 mg by mouth every 6 (six) hours as needed for mild pain or fever.    Yes [provider]  amLODipine (NORVASC) 5 MG tablet Take 5 mg by mouth every evening.    Yes [provider]  aspirin EC 81 MG EC tablet Take 2 tablets (162 mg total) by mouth daily. Patient taking differently: Take 81 mg by mouth at bedtime.  01/20/17  Yes Patrecia Pour, Christean Grief, MD  carvedilol (COREG) 12.5 MG tablet Take 1 tablet (12.5 mg total) by mouth 2 (two) times daily with a meal. 02/08/17  Yes Bonnell Public, MD  Darbepoetin Alfa (ARANESP) 150 MCG/0.3ML SOSY injection Inject 0.3 mLs (150 mcg total) into the vein every Friday with hemodialysis. 02/11/17  Yes  Dana Allan I, MD  gabapentin (NEURONTIN) 100 MG capsule Take 200 mg by mouth daily.  09/27/18  Yes [provider]  heparin 1000 UNIT/ML injection Inject 4 mLs into the vein See admin instructions. Every hemodialysis treatment 08/14/18 08/13/19 Yes [provider]  hydrOXYzine (ATARAX/VISTARIL) 50 MG tablet Take 50 mg by mouth 3 (three) times daily as needed for itching.    Yes [provider]  montelukast (SINGULAIR) 10 MG tablet Take 10 mg by mouth at bedtime as needed. 08/14/18  Yes [provider]  multivitamin (RENA-VIT) TABS tablet Take 1 tablet by mouth at bedtime. 02/08/17  Yes Dana Allan I, MD  oxyCODONE (OXY IR/ROXICODONE) 5 MG immediate release tablet Take 1 tablet (5 mg total) by mouth every 6 (six) hours as needed for moderate pain, severe pain or breakthrough pain. 11/22/18  Yes Coralie Keens, MD  sevelamer carbonate (RENVELA) 800 MG tablet Take 1,600-3,200 mg by mouth 3 (three) times daily with meals. 2-4 tabs with each meal   Yes [provider]  temazepam (RESTORIL) 30 MG capsule Take 30 mg by mouth at bedtime.    Yes [provider]   Liver Function Tests Recent Labs  Lab 11/24/18 1410 11/25/18 0406  AST 18  --   ALT 5  --   ALKPHOS 154*  --   BILITOT 1.0  --   PROT 7.9  --   ALBUMIN 3.3* 2.8*   No results for input(s): LIPASE, AMYLASE in the last 168 hours. CBC Recent Labs  Lab 11/21/18 0846 11/21/18 1027 11/24/18 1410 11/25/18 0406  WBC 7.1  --  10.7* 12.2*  NEUTROABS  --   --  8.9*  --   HGB 12.4* 11.9* 11.5* 10.1*  HCT 40.8 35.0* 35.1* 31.0*  MCV 96.2  --  90.9 92.0  PLT 173  --  131* 026*   Basic Metabolic Panel Recent Labs  Lab 11/21/18 1027 11/24/18 1410 11/25/18 0406  NA 138 136 134*  K 5.9* 4.5 4.8  CL 99 92* 91*  CO2  --  27 27  GLUCOSE 132* 93 89  BUN 33* 19 27*  CREATININE 5.90* 4.11* 5.15*  CALCIUM  --  8.9 9.2  PHOS  --   --  6.9*   Iron/TIBC/Ferritin/ %Sat  Component Value Date/Time   IRON 87 07/11/2018 1454   TIBC 189 (L) 07/11/2018 1454   IRONPCTSAT 46 (H) 07/11/2018 1454    Vitals:   11/24/18 2114 11/25/18 0310 11/25/18 0817 11/25/18 0945  BP: 114/73 129/79 129/78 130/78  Pulse:  85 82 83  Resp: 15     Temp: 99.3 F (37.4 C) 98.2 F (36.8 C) 98.3 F (36.8 C)   TempSrc: Oral Oral Oral   SpO2: 97% 95% 99%   Weight:  69.7 kg    Height: 5\' 11"  (1.803 m)       Exam Gen alert, a little confused, doesn't know why he is here, pleasant No rash, cyanosis or gangrene Sclera anicteric, throat clear  No jvd or bruits Chest clear bilat to bases RRR no MRG  Abd soft ntnd no mass or ascites +bs GU normal male MS no joint effusions or deformity Ext no LE or UE edema, no wounds or ulcers Neuro is alert, Ox 3, nonfocal LUA AVF+bruit   Home meds:  - amlodipine 5/ carvedilol 12.5 bid  - temazepam 30 hs/ oxycodone 5 qid prn/ gabapentin 200 hs  - aspirin 81  - sevelamer carb ac tid  - montelukast 10 prn hs  - prn's/ vitamins/ supplements     Outpt HD: [facility] MWF  4h  65.5kg  2/2 bath  Hep none P4  LUA AVF  - mircera 75 ug last 9/30  - hect 3 ug  - ven 50/wk     Assessment/ Plan: 1. AMS - meds vs sepsis +/- other. On empiric IV abx, cx's pending. Per primary team.  2. SP recent bilat inguinal hernia repair  3. ESRD - HD MWF. Had full HD yest, no need for HD today. Will follow 4. HTN/ volume - BP's wnl, cont meds as tol 5. PAD sp L AKA 6. IDDM 7. Anemia ckd - Hb 10, no esa needed 8. MBD ckd - cont meds      Kelly Splinter  MD 11/25/2018, 11:56 AM

## 2018-11-25 NOTE — Progress Notes (Signed)
Patient Demographics:    Matthew Kline, is a 58 y.o. male, DOB - 12-21-1960, JGG:[id number]  Admit date - 11/24/2018   Admitting Physician Norval Morton, MD  Outpatient Primary MD for the patient is [facility] [place]  LOS - 1   Chief Complaint  Patient presents with  . Altered Mental Status  . Fever        Subjective:    Matthew Kline today has no further fevers, no emesis,  No chest pain,   -More coherent No nausea, vomiting, diarrhea   Assessment  & Plan :    Principal Problem:   Sepsis ([place]) Active Problems:   Acute metabolic encephalopathy   Anemia of chronic disease   Type II diabetes mellitus (HCC)   Hx of AKA (above knee amputation), left (HCC)   Thrombocytopenia (HCC)   Diabetes mellitus with renal manifestations, controlled ([place])   CAD, multiple vessel  Brief Summary- 58 y.o. male with PMHx significant for of ESRD on HD(M/W/F), DM type II, CAD s/p CABG, left AKA chronic low back pain, anxiety, and GERD admitted 11/24/2018 with fever and confusion raising concerns about possible sepsis  A/p 1) sepsis of unknown etiology in a hemodialysis patient--patient met sepsis criteria on admission with fevers, altered mentation, leukocytosis and tachypnea--- continue vancomycin, she will and cefepime pending cultures --Mental status improving, query if mental status changes related to opiate use, compounded by gabapentin, hydroxyzine and Restoril use   2)ESRD--on MWF schedule, last HD session on 11/24/2018, next HD session on 11/27/2018, -Nephrology input appreciated  3)Recent bilateral inguinal hernia repair on 11/21/2018 by Dr. Ninfa Linden--- no evidence of infection at the site  4)HTN--stable, continue amlodipine 5 mg daily, Coreg 12.5 mg twice daily,,   may use IV labetalol when necessary  Every 4 hours for systolic blood pressure over 160 mmhg  5)Anemia of CKD---  hemoglobin currently around 10, EPO agent per nephrology team  6)Acute Metabolic Encephalopathy--- secondary to #1 above, managed as above #1, cannot rule out medication related as outlined in #1 above  7)H/o DM2-recent A1c 5.7, no routine diabetic meds at this time  8)H/o CAD/PAD-post prior CABG--also status post prior left AKA stable, no chest pains, continue aspirin and Coreg, add Lipitor  Disposition/Need for in-Hospital Stay- patient unable to be discharged at this time due to --requiring IV antibiotics pending culture results for presumed sepsis -Possible discharge home on 11/27/2018 after hemodialysis session-remains afebrile and cultures are negative  Code Status : Full  Family Communication:   NA (patient is alert, awake and coherent)  Disposition Plan  : see above  Consults  : Nephrology  DVT Prophylaxis  :    - Heparin - SCDs   Lab Results  Component Value Date   PLT 135 (L) 11/25/2018   Inpatient Medications Scheduled Meds: . amLODipine  5 mg Oral QPM  . aspirin EC  81 mg Oral QHS  . carvedilol  12.5 mg Oral BID WC  . [START ON 11/27/2018] doxercalciferol  3 mcg Intravenous Q M,W,F-HD  . gabapentin  200 mg Oral QHS  . heparin  5,000 Units Subcutaneous Q8H  . multivitamin  1 tablet Oral QHS  . sevelamer carbonate  2,400 mg Oral TID WC   Continuous Infusions: . [START ON  11/27/2018] ceFEPime (MAXIPIME) IV    . metronidazole 500 mg (11/25/18 1303)  . [START ON 11/27/2018] vancomycin     PRN Meds:.acetaminophen **OR** acetaminophen    Anti-infectives (From admission, onward)   Start     Dose/Rate Route Frequency Ordered Stop   11/27/18 1800  ceFEPIme (MAXIPIME) 2 g in sodium chloride 0.9 % 100 mL IVPB     2 g 200 mL/hr over 30 Minutes Intravenous Every M-W-F (1800) 11/24/18 1420     11/27/18 1200  vancomycin (VANCOCIN) IVPB 750 mg/150 ml premix     750 mg 150 mL/hr over 60 Minutes Intravenous Every M-W-F (Hemodialysis) 11/24/18 1420     11/25/18 0600   metroNIDAZOLE (FLAGYL) IVPB 500 mg     500 mg 100 mL/hr over 60 Minutes Intravenous Every 8 hours 11/24/18 1826     11/24/18 1415  ceFEPIme (MAXIPIME) 2 g in sodium chloride 0.9 % 100 mL IVPB     2 g 200 mL/hr over 30 Minutes Intravenous  Once 11/24/18 1410 11/24/18 1554   11/24/18 1415  metroNIDAZOLE (FLAGYL) IVPB 500 mg     500 mg 100 mL/hr over 60 Minutes Intravenous  Once 11/24/18 1410 11/24/18 1747   11/24/18 1415  vancomycin (VANCOCIN) IVPB 1000 mg/200 mL premix  Status:  Discontinued     1,000 mg 200 mL/hr over 60 Minutes Intravenous  Once 11/24/18 1410 11/24/18 1414   11/24/18 1415  vancomycin (VANCOCIN) 1,500 mg in sodium chloride 0.9 % 500 mL IVPB     1,500 mg 250 mL/hr over 120 Minutes Intravenous  Once 11/24/18 1414 11/24/18 1857        Objective:   Vitals:   11/24/18 2114 11/25/18 0310 11/25/18 0817 11/25/18 0945  BP: 114/73 129/79 129/78 130/78  Pulse:  85 82 83  Resp: 15     Temp: 99.3 F (37.4 C) 98.2 F (36.8 C) 98.3 F (36.8 C)   TempSrc: Oral Oral Oral   SpO2: 97% 95% 99%   Weight:  69.7 kg    Height: '5\' 11"'  (1.803 m)       Wt Readings from Last 3 Encounters:  11/25/18 69.7 kg  11/21/18 65.7 kg  11/14/18 64.9 kg     Intake/Output Summary (Last 24 hours) at 11/25/2018 1438 Last data filed at 11/24/2018 1857 Gross per 24 hour  Intake 600 ml  Output -  Net 600 ml     Physical Exam Gen:- Awake Alert,  In no apparent distress  HEENT:- [place].AT, No sclera icterus Neck-Supple Neck,No JVD,.  Lungs-  CTAB , fair symmetrical air movement CV- S1, S2 normal, regular, CABG scar Abd-  +ve B.Sounds, Abd Soft, No tenderness, healing bilateral inguinal hernia repair wounds Extremity/Skin:- No  edema, pedal pulses present  Psych-affect is appropriate, oriented x3 Neuro-no new focal deficits, no tremors MSK-left arm AV fistula with positive thrill and bruit -Left AKA stump healed   Data Review:   Micro Results Recent Results (from the past 240 hour(s))   Novel Coronavirus, NAA (Hosp order, Send-out to Ref Lab; TAT 18-24 hrs     Status: None   Collection Time: 11/17/18 12:21 PM   Specimen: Nasopharyngeal Swab; Respiratory  Result Value Ref Range Status   SARS-CoV-2, NAA NOT DETECTED NOT DETECTED Final    Comment: (NOTE) Testing was performed using the cobas(R) SARS-CoV-2 test. This nucleic acid amplification test was developed and its performance characteristics determined by [employer]. Nucleic acid amplification tests include PCR and TMA. This test has not been  FDA cleared or approved. This test has been authorized by FDA under an Emergency Use Authorization (EUA). This test is only authorized for the duration of time the declaration that circumstances exist justifying the authorization of the emergency use of in vitro diagnostic tests for detection of SARS-CoV-2 virus and/or diagnosis of COVID-19 infection under section 564(b)(1) of the Act, 21 U.S.C. 759FMB-8(G) (1), unless the authorization is terminated or revoked sooner. When diagnostic testing is negative, the possibility of a false negative result should be considered in the context of a patient's recent exposures and the presence of clinical signs and symptoms consistent with COVID-19. An individual without s ymptoms of COVID- 19 and who is not shedding SARS-CoV-2 virus would expect to have a negative (not detected) result in this assay. Performed At: [facility] [address] [place], [place] [id number] Rush Farmer MD VX:[id number]    [place]  Final    Comment: Performed at [facility], [place] [address], [place], [place] [address]  SARS CORONAVIRUS 2 (TAT 6-24 HRS) Nasopharyngeal Nasopharyngeal Swab     Status: None   Collection Time: 11/24/18  2:12 PM   Specimen: Nasopharyngeal Swab  Result Value Ref Range Status   SARS Coronavirus 2 NEGATIVE NEGATIVE Final    Comment: (NOTE) SARS-CoV-2 target nucleic acids are NOT  DETECTED. The SARS-CoV-2 RNA is generally detectable in upper and lower respiratory specimens during the acute phase of infection. Negative results do not preclude SARS-CoV-2 infection, do not rule out co-infections with other pathogens, and should not be used as the sole basis for treatment or other patient management decisions. Negative results must be combined with clinical observations, patient history, and epidemiological information. The expected result is Negative. Fact Sheet for Patients: SugarRoll.be Fact Sheet for Healthcare Providers: https://www.woods-mathews.com/ This test is not yet approved or cleared by the [place] FDA and  has been authorized for detection and/or diagnosis of SARS-CoV-2 by FDA under an Emergency Use Authorization (EUA). This EUA will remain  in effect (meaning this test can be used) for the duration of the COVID-19 declaration under Section 56 4(b)(1) of the Act, 21 U.S.C. section 360bbb-3(b)(1), unless the authorization is terminated or revoked sooner. Performed at [facility], [place] [address] [place], [place] [address]     Radiology Reports Dg Chest 2 View  Result Date: 11/24/2018 CLINICAL DATA:  Altered mental status after dialysis today.  Fever. EXAM: CHEST - 2 VIEW COMPARISON:  11/24/2018 and 1415 hours, 10/23/2018 and CT chest 09/01/2014. FINDINGS: Trachea is midline. Heart is enlarged. Thoracic aorta is calcified. There are streaky densities in the perihilar regions and medial aspects of both lower lobes. No dense airspace consolidation or pleural fluid. IMPRESSION: Streaky opacification in both perihilar regions may be due to atelectasis and/or scarring. Electronically Signed   By: Lorin Picket M.D.   On: 11/24/2018 15:29   Ct Head Wo Contrast  Result Date: 11/24/2018 CLINICAL DATA:  Altered mental status during dialysis. EXAM: CT HEAD WITHOUT CONTRAST TECHNIQUE: Contiguous axial  images were obtained from the base of the skull through the vertex without intravenous contrast. COMPARISON:  Head CT 01/30/2017 FINDINGS: Brain: The ventricles are in the midline without mass effect or shift. They are normal in size and configuration given the degree of mild cerebral atrophy. 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 No CT findings for acute hemispheric infarction or intracranial hemorrhage. No extra-axial fluid collections are identified. No mass lesions. The brainstem and cerebellum are grossly  normal. Vascular: Stable age advanced vascular calcifications. No aneurysm or hyperdense vessels. Skull: No skull fracture or bone lesions. Sinuses/Orbits: The paranasal sinuses and mastoid air cells are clear. The globes are intact. Other: No scalp lesions or hematoma IMPRESSION: No acute intracranial findings.  No change since prior study. Stable age advanced vascular calcifications. Electronically Signed   By: Marijo Sanes M.D.   On: 11/24/2018 19:03   Dg Chest Port 1 View  Result Date: 11/24/2018 CLINICAL DATA:  Altered mental status. EXAM: PORTABLE CHEST 1 VIEW COMPARISON:  10/23/2018 FINDINGS: 1417 hours. Low lung volumes. The cardio pericardial silhouette is enlarged. Interstitial markings are diffusely coarsened with chronic features. Bilateral streaky parahilar opacities suggest atelectasis. Patchy opacity noted left lung base compatible with atelectasis or pneumonia. No substantial pleural effusion. Bones are diffusely demineralized. Telemetry leads overlie the chest. IMPRESSION: Low volume film with chronic underlying interstitial lung disease. Streaky bilateral parahilar opacity, likely atelectasis with some additional atelectasis or pneumonia left base. Repeat upright PA and lateral chest x-ray, when patient able, recommended for more thorough assessment. Electronically Signed   By: Misty Stanley M.D.   On: 11/24/2018 14:39   Xr Foot 2 Views Right  Result Date:  10/31/2018 Radiographs of right foot without concerning sign of osteomyelitis. Does have calcification of vessels out to metatarsals.     CBC Recent Labs  Lab 11/21/18 0846 11/21/18 1027 11/24/18 1410 11/25/18 0406  WBC 7.1  --  10.7* 12.2*  HGB 12.4* 11.9* 11.5* 10.1*  HCT 40.8 35.0* 35.1* 31.0*  PLT 173  --  131* 135*  MCV 96.2  --  90.9 92.0  MCH 29.2  --  29.8 30.0  MCHC 30.4  --  32.8 32.6  RDW 14.1  --  13.8 13.8  LYMPHSABS  --   --  0.4*  --   MONOABS  --   --  1.1*  --   EOSABS  --   --  0.2  --   BASOSABS  --   --  0.0  --     Chemistries  Recent Labs  Lab 11/21/18 1027 11/24/18 1410 11/25/18 0406  NA 138 136 134*  K 5.9* 4.5 4.8  CL 99 92* 91*  CO2  --  27 27  GLUCOSE 132* 93 89  BUN 33* 19 27*  CREATININE 5.90* 4.11* 5.15*  CALCIUM  --  8.9 9.2  AST  --  18  --   ALT  --  5  --   ALKPHOS  --  154*  --   BILITOT  --  1.0  --    ------------------------------------------------------------------------------------------------------------------ No results for input(s): CHOL, HDL, LDLCALC, TRIG, CHOLHDL, LDLDIRECT in the last 72 hours.  Lab Results  Component Value Date   HGBA1C 5.7 (H) 10/03/2018   ------------------------------------------------------------------------------------------------------------------ No results for input(s): TSH, T4TOTAL, T3FREE, THYROIDAB in the last 72 hours.  Invalid input(s): FREET3 ------------------------------------------------------------------------------------------------------------------ No results for input(s): VITAMINB12, FOLATE, FERRITIN, TIBC, IRON, RETICCTPCT in the last 72 hours.  Coagulation profile Recent Labs  Lab 11/24/18 1410  INR 1.2    No results for input(s): DDIMER in the last 72 hours.  Cardiac Enzymes No results for input(s): CKMB, TROPONINI, MYOGLOBIN in the last 168 hours.  Invalid input(s): CK  ------------------------------------------------------------------------------------------------------------------    Component Value Date/Time   BNP 4,065.6 (H) 07/04/2015 1431   Roxan Hockey M.D on 11/25/2018 at 2:38 PM  Go to [email or web address] - for contact info  [facility] - Office  [phone]       ''

## 2018-11-26 ENCOUNTER — Inpatient Hospital Stay (HOSPITAL_COMMUNITY): Payer: Medicare Other

## 2018-11-26 DIAGNOSIS — K4020 Bilateral inguinal hernia, without obstruction or gangrene, not specified as recurrent: Secondary | ICD-10-CM | POA: Diagnosis not present

## 2018-11-26 DIAGNOSIS — A419 Sepsis, unspecified organism: Secondary | ICD-10-CM | POA: Diagnosis not present

## 2018-11-26 LAB — COMPREHENSIVE METABOLIC PANEL
ALT: <5 U/L (ref 0–44)
AST: 13 U/L — ABNORMAL LOW (ref 15–41)
Albumin: 2.8 g/dL — ABNORMAL LOW (ref 3.5–5.0)
Alkaline Phosphatase: 146 U/L — ABNORMAL HIGH (ref 38–126)
Anion gap: 17 — ABNORMAL HIGH (ref 5–15)
BUN: 48 mg/dL — ABNORMAL HIGH (ref 6–20)
CO2: 24 mmol/L (ref 22–32)
Calcium: 9.5 mg/dL (ref 8.9–10.3)
Chloride: 94 mmol/L — ABNORMAL LOW (ref 98–111)
Creatinine, Ser: 7.53 mg/dL — ABNORMAL HIGH (ref 0.61–1.24)
GFR calc Af Amer: 8 mL/min — ABNORMAL LOW (ref >60)
GFR calc non Af Amer: 7 mL/min — ABNORMAL LOW (ref >60)
Glucose, Bld: 110 mg/dL — ABNORMAL HIGH (ref 70–99)
Potassium: 5.1 mmol/L (ref 3.5–5.1)
Sodium: 135 mmol/L (ref 135–145)
Total Bilirubin: 0.8 mg/dL (ref 0.3–1.2)
Total Protein: 7.2 g/dL (ref 6.5–8.1)

## 2018-11-26 LAB — CBC WITH DIFFERENTIAL/PLATELET
Abs Immature Granulocytes: 0.06 10*3/uL (ref 0.00–0.07)
Basophils Absolute: 0 10*3/uL (ref 0.0–0.1)
Basophils Relative: 0 %
Eosinophils Absolute: 0.2 10*3/uL (ref 0.0–0.5)
Eosinophils Relative: 2 %
HCT: 31.9 % — ABNORMAL LOW (ref 39.0–52.0)
Hemoglobin: 10.6 g/dL — ABNORMAL LOW (ref 13.0–17.0)
Immature Granulocytes: 1 %
Lymphocytes Relative: 6 %
Lymphs Abs: 0.6 10*3/uL — ABNORMAL LOW (ref 0.7–4.0)
MCH: 29.9 pg (ref 26.0–34.0)
MCHC: 33.2 g/dL (ref 30.0–36.0)
MCV: 90.1 fL (ref 80.0–100.0)
Monocytes Absolute: 0.8 10*3/uL (ref 0.1–1.0)
Monocytes Relative: 8 %
Neutro Abs: 8.2 10*3/uL — ABNORMAL HIGH (ref 1.7–7.7)
Neutrophils Relative %: 83 %
Platelets: 135 10*3/uL — ABNORMAL LOW (ref 150–400)
RBC: 3.54 MIL/uL — ABNORMAL LOW (ref 4.22–5.81)
RDW: 13.6 % (ref 11.5–15.5)
WBC: 9.9 10*3/uL (ref 4.0–10.5)
nRBC: 0 % (ref 0.0–0.2)

## 2018-11-26 LAB — GLUCOSE, CAPILLARY
Glucose-Capillary: 88 mg/dL (ref 70–99)
Glucose-Capillary: 163 mg/dL — ABNORMAL HIGH (ref 70–99)
Glucose-Capillary: 160 mg/dL — ABNORMAL HIGH (ref 70–99)
Glucose-Capillary: 169 mg/dL — ABNORMAL HIGH (ref 70–99)

## 2018-11-26 LAB — MRSA PCR SCREENING: MRSA by PCR: NEGATIVE

## 2018-11-26 LAB — PROCALCITONIN: Procalcitonin: 0.95 ng/mL

## 2018-11-26 MED ORDER — SODIUM CHLORIDE 0.9 % IV SOLN
INTRAVENOUS | Status: DC | PRN
  Administered 2018-11-26 – 2018-11-27 (×3): 500 mL via INTRAVENOUS

## 2018-11-26 MED ORDER — OXYCODONE HCL 5 MG PO TABS
5.0000 mg | ORAL_TABLET | Freq: Four times a day (QID) | ORAL | Status: DC | PRN
  Administered 2018-11-26 – 2018-11-27 (×3): 5 mg via ORAL
  Filled 2018-11-26 (×3): qty 1

## 2018-11-26 NOTE — Progress Notes (Signed)
PROGRESS NOTE    Matthew Kline  YSA:[id number] DOB: Aug 09, 1960 DOA: 11/24/2018 PCP: [facility]   Brief Narrative:  58 y.o.malewith PMHx significant for ofESRD on HD(M/W/F), DM type II, CADs/p CABG, left AKA chronic low back pain, anxiety,andGERD admitted 11/24/2018 with fever and confusion raising concerns about possible sepsis.  CT head was unremarkable.  He had temperature of 100.8 in the emergency department.  He has remained afebrile since then.  He also had leukocytosis.  He was started on paraspinal antibiotics.  Blood culture drawn.  Assessment & Plan:   Principal Problem:   Sepsis (New ) Active Problems:   Anemia of chronic disease   Diabetes mellitus with renal manifestations, controlled ([place])   Type II diabetes mellitus (HCC)   CAD, multiple vessel   Acute metabolic encephalopathy   Hx of AKA (above knee amputation), left (HCC)   Thrombocytopenia (HCC)   Acute metabolic encephalopathy: Source unknown, presumed to be due to some unknown infection.  He is completely alert and oriented and back to baseline now.  SIRS: He meets SIRS criteria based on temperature, tachycardia and leukocytosis however there was no source of infection.  He was started on broad-spectrum antibiotics and blood culture were drawn.  Blood cultures have remained negative.  He has remained afebrile.  Leukocytosis also improved however his procalcitonin is elevated.  We will continue current antibiotics and repeat procalcitonin tomorrow and keep overnight to watch for any fever or any other symptoms for occult infection.  Scrotal (testicular ) swelling/pain: He is me that he has developed swelling in the right scrotum which is slightly painful as well.  On examination, he does have some redness but it is hard to tell whether this is his baseline or not.  It is not warm and only minimally tender.  We will proceed with ultrasound scrotum.  ESRD on HD: Management per nephrology.   Essential hypertension: Controlled.  Continue current regimen.  Anemia of CKD: Hemoglobin is stable.  H/o CAD/PAD-post prior CABG--also status post prior left AKA stable, no chest pains, continue aspirin and Coreg, add Lipitor  DVT prophylaxis: Heparin Code Status: Full code Family Communication: His friend was at the bedside.  He was concerned about possible stroke.  He was aware that patient had CT head which was unremarkable.  He is also aware that currently patient has no confusion or any focal deficit on examination for me to suspect any stroke.  No indication of MRI.  He verbalized understanding but he was scared since he has had 2 family members who had stroke.  I provided counseling.  Plan of care discussed with him and the patient.  They verbalized understanding. Disposition Plan: Potential discharge tomorrow.  Estimated body mass index is 21.46 kg/m as calculated from the following:   Height as of this encounter: 5\' 11"  (1.803 m).   Weight as of this encounter: 69.8 kg.  Pressure Injury 01/29/17 Stage I -  Intact skin with non-blanchable redness of a localized area usually over a bony prominence. (Active)  01/29/17 2235  Location: Buttocks  Location Orientation: Posterior;Right;Left  Staging: Stage I -  Intact skin with non-blanchable redness of a localized area usually over a bony prominence.  Wound Description (Comments):   Present on Admission: Yes     Nutritional status:               Consultants:   Nephrology  Procedures:   None  Antimicrobials:   IV Zosyn, Flagyl and vancomycin.   Subjective: Patient seen  and examined.  He is completely alert and oriented.  The only complaint he has is right testicular swelling and pain which is minimal.  No other complaint.  Objective: Vitals:   11/26/18 0040 11/26/18 0526 11/26/18 0737 11/26/18 1213  BP: 132/83  131/72 131/79  Pulse: 81 76 75 79  Resp: 16     Temp: 98.3 F (36.8 C) 98.6 F (37 C) 98.4  F (36.9 C) 98.4 F (36.9 C)  TempSrc: Oral Oral Oral Oral  SpO2:  94% 97% 97%  Weight:  69.8 kg    Height:        Intake/Output Summary (Last 24 hours) at 11/26/2018 1356 Last data filed at 11/26/2018 1100 Gross per 24 hour  Intake 360 ml  Output -  Net 360 ml   Filed Weights   11/24/18 2017 11/25/18 0310 11/26/18 0526  Weight: 69.7 kg 69.7 kg 69.8 kg    Examination:  General exam: Appears calm and comfortable  Respiratory system: Clear to auscultation. Respiratory effort normal. Cardiovascular system: S1 & S2 heard, RRR. No JVD, murmurs, rubs, gallops or clicks. No pedal edema. Gastrointestinal system: Abdomen is nondistended, soft and nontender. No organomegaly or masses felt. Normal bowel sounds heard.  Right testicular swelling with some redness on the scrotal skin.  Minimal tenderness. Central nervous system: Alert and oriented. No focal neurological deficits. Extremities: Symmetric 5 x 5 power. Skin: No rashes, lesions or ulcers Psychiatry: Judgement and insight appear normal. Mood & affect appropriate.    Data Reviewed: I have personally reviewed following labs and imaging studies  CBC: Recent Labs  Lab 11/21/18 0846 11/21/18 1027 11/24/18 1410 11/25/18 0406 11/26/18 1128  WBC 7.1  --  10.7* 12.2* 9.9  NEUTROABS  --   --  8.9*  --  8.2*  HGB 12.4* 11.9* 11.5* 10.1* 10.6*  HCT 40.8 35.0* 35.1* 31.0* 31.9*  MCV 96.2  --  90.9 92.0 90.1  PLT 173  --  131* 135* 010*   Basic Metabolic Panel: Recent Labs  Lab 11/21/18 1027 11/24/18 1410 11/25/18 0406 11/26/18 1128  NA 138 136 134* 135  K 5.9* 4.5 4.8 5.1  CL 99 92* 91* 94*  CO2  --  27 27 24   GLUCOSE 132* 93 89 110*  BUN 33* 19 27* 48*  CREATININE 5.90* 4.11* 5.15* 7.53*  CALCIUM  --  8.9 9.2 9.5  PHOS  --   --  6.9*  --    GFR: Estimated Creatinine Clearance: 10.4 mL/min (A) (by C-G formula based on SCr of 7.53 mg/dL (H)). Liver Function Tests: Recent Labs  Lab 11/24/18 1410 11/25/18 0406  11/26/18 1128  AST 18  --  13*  ALT 5  --  <5  ALKPHOS 154*  --  146*  BILITOT 1.0  --  0.8  PROT 7.9  --  7.2  ALBUMIN 3.3* 2.8* 2.8*   No results for input(s): LIPASE, AMYLASE in the last 168 hours. No results for input(s): AMMONIA in the last 168 hours. Coagulation Profile: Recent Labs  Lab 11/24/18 1410  INR 1.2   Cardiac Enzymes: No results for input(s): CKTOTAL, CKMB, CKMBINDEX, TROPONINI in the last 168 hours. BNP (last 3 results) No results for input(s): PROBNP in the last 8760 hours. HbA1C: No results for input(s): HGBA1C in the last 72 hours. CBG: Recent Labs  Lab 11/25/18 1158 11/25/18 1645 11/25/18 2029 11/26/18 0735 11/26/18 1209  GLUCAP 116* 151* 138* 88 163*   Lipid Profile: No results for input(s): CHOL,  HDL, LDLCALC, TRIG, CHOLHDL, LDLDIRECT in the last 72 hours. Thyroid Function Tests: No results for input(s): TSH, T4TOTAL, FREET4, T3FREE, THYROIDAB in the last 72 hours. Anemia Panel: No results for input(s): VITAMINB12, FOLATE, FERRITIN, TIBC, IRON, RETICCTPCT in the last 72 hours. Sepsis Labs: Recent Labs  Lab 11/24/18 1416 11/24/18 2037 11/26/18 1128  PROCALCITON  --   --  0.95  LATICACIDVEN 1.0 0.9  --     Recent Results (from the past 240 hour(s))  Novel Coronavirus, NAA (Hosp order, Send-out to Ref Lab; TAT 18-24 hrs     Status: None   Collection Time: 11/17/18 12:21 PM   Specimen: Nasopharyngeal Swab; Respiratory  Result Value Ref Range Status   SARS-CoV-2, NAA NOT DETECTED NOT DETECTED Final    Comment: (NOTE) Testing was performed using the cobas(R) SARS-CoV-2 test. This nucleic acid amplification test was developed and its performance characteristics determined by [employer]. Nucleic acid amplification tests include PCR and TMA. This test has not been FDA cleared or approved. This test has been authorized by FDA under an Emergency Use Authorization (EUA). This test is only authorized for the duration of time the  declaration that circumstances exist justifying the authorization of the emergency use of in vitro diagnostic tests for detection of SARS-CoV-2 virus and/or diagnosis of COVID-19 infection under section 564(b)(1) of the Act, 21 U.S.C. 960AVW-0(J) (1), unless the authorization is terminated or revoked sooner. When diagnostic testing is negative, the possibility of a false negative result should be considered in the context of a patient's recent exposures and the presence of clinical signs and symptoms consistent with COVID-19. An individual without s ymptoms of COVID- 19 and who is not shedding SARS-CoV-2 virus would expect to have a negative (not detected) result in this assay. Performed At: [facility] [address] [place], [place] [id number] Rush Farmer MD NF:[id number]    [place]  Final    Comment: Performed at [facility], [place] [address], [place], [place] [address]  SARS CORONAVIRUS 2 (TAT 6-24 HRS) Nasopharyngeal Nasopharyngeal Swab     Status: None   Collection Time: 11/24/18  2:12 PM   Specimen: Nasopharyngeal Swab  Result Value Ref Range Status   SARS Coronavirus 2 NEGATIVE NEGATIVE Final    Comment: (NOTE) SARS-CoV-2 target nucleic acids are NOT DETECTED. The SARS-CoV-2 RNA is generally detectable in upper and lower respiratory specimens during the acute phase of infection. Negative results do not preclude SARS-CoV-2 infection, do not rule out co-infections with other pathogens, and should not be used as the sole basis for treatment or other patient management decisions. Negative results must be combined with clinical observations, patient history, and epidemiological information. The expected result is Negative. Fact Sheet for Patients: SugarRoll.be Fact Sheet for Healthcare Providers: https://www.woods-mathews.com/ This test is not yet approved or cleared by the [place] FDA  and  has been authorized for detection and/or diagnosis of SARS-CoV-2 by FDA under an Emergency Use Authorization (EUA). This EUA will remain  in effect (meaning this test can be used) for the duration of the COVID-19 declaration under Section 56 4(b)(1) of the Act, 21 U.S.C. section 360bbb-3(b)(1), unless the authorization is terminated or revoked sooner. Performed at [facility], [place] [address], [place], [place] [address]   Blood Culture (routine x 2)     Status: None (Preliminary result)   Collection Time: 11/24/18  2:31 PM   Specimen: BLOOD  Result Value Ref Range Status   Specimen Description BLOOD  RIGHT ANTECUBITAL  Final   Special Requests   Final    BOTTLES DRAWN AEROBIC AND ANAEROBIC Blood Culture adequate volume   Culture   Final    NO GROWTH 2 DAYS Performed at [facility], [address], [place], [place] [address]    Report Status PENDING  Incomplete  Blood Culture (routine x 2)     Status: None (Preliminary result)   Collection Time: 11/24/18  2:32 PM   Specimen: BLOOD RIGHT WRIST  Result Value Ref Range Status   Specimen Description BLOOD RIGHT WRIST  Final   Special Requests   Final    BOTTLES DRAWN AEROBIC AND ANAEROBIC Blood Culture adequate volume   Culture   Final    NO GROWTH 2 DAYS Performed at [facility], [place] [address], [place], [place] [address]    Report Status PENDING  Incomplete  MRSA PCR Screening     Status: None   Collection Time: 11/26/18  5:55 AM   Specimen: Nasopharyngeal  Result Value Ref Range Status   MRSA by PCR NEGATIVE NEGATIVE Final    Comment:        The GeneXpert MRSA Assay (FDA approved for NASAL specimens only), is one component of a comprehensive MRSA colonization surveillance program. It is not intended to diagnose MRSA infection nor to guide or monitor treatment for MRSA infections. Performed at [facility], [place] [address] [place], [place] [address]       Radiology Studies: Dg Chest 2  View  Result Date: 11/24/2018 CLINICAL DATA:  Altered mental status after dialysis today.  Fever. EXAM: CHEST - 2 VIEW COMPARISON:  11/24/2018 and 1415 hours, 10/23/2018 and CT chest 09/01/2014. FINDINGS: Trachea is midline. Heart is enlarged. Thoracic aorta is calcified. There are streaky densities in the perihilar regions and medial aspects of both lower lobes. No dense airspace consolidation or pleural fluid. IMPRESSION: Streaky opacification in both perihilar regions may be due to atelectasis and/or scarring. Electronically Signed   By: Lorin Picket M.D.   On: 11/24/2018 15:29   Ct Head Wo Contrast  Result Date: 11/24/2018 CLINICAL DATA:  Altered mental status during dialysis. EXAM: CT HEAD WITHOUT CONTRAST TECHNIQUE: Contiguous axial images were obtained from the base of the skull through the vertex without intravenous contrast. COMPARISON:  Head CT 01/30/2017 FINDINGS: Brain: The ventricles are in the midline without mass effect or shift. They are normal in size and configuration given the degree of mild cerebral atrophy. 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 No CT findings for acute hemispheric infarction or intracranial hemorrhage. No extra-axial fluid collections are identified. No mass lesions. The brainstem and cerebellum are grossly normal. Vascular: Stable age advanced vascular calcifications. No aneurysm or hyperdense vessels. Skull: No skull fracture or bone lesions. Sinuses/Orbits: The paranasal sinuses and mastoid air cells are clear. The globes are intact. Other: No scalp lesions or hematoma IMPRESSION: No acute intracranial findings.  No change since prior study. Stable age advanced vascular calcifications. Electronically Signed   By: Marijo Sanes M.D.   On: 11/24/2018 19:03   Dg Chest Port 1 View  Result Date: 11/24/2018 CLINICAL DATA:  Altered mental status. EXAM: PORTABLE CHEST 1 VIEW COMPARISON:  10/23/2018 FINDINGS: 1417 hours. Low lung volumes. The  cardio pericardial silhouette is enlarged. Interstitial markings are diffusely coarsened with chronic features. Bilateral streaky parahilar opacities suggest atelectasis. Patchy opacity noted left lung base compatible with atelectasis or pneumonia. No substantial pleural effusion. Bones are diffusely  demineralized. Telemetry leads overlie the chest. IMPRESSION: Low volume film with chronic underlying interstitial lung disease. Streaky bilateral parahilar opacity, likely atelectasis with some additional atelectasis or pneumonia left base. Repeat upright PA and lateral chest x-ray, when patient able, recommended for more thorough assessment. Electronically Signed   By: Misty Stanley M.D.   On: 11/24/2018 14:39    Scheduled Meds: . amLODipine  5 mg Oral QPM  . aspirin EC  81 mg Oral QHS  . atorvastatin  40 mg Oral q1800  . carvedilol  12.5 mg Oral BID WC  . [START ON 11/27/2018] doxercalciferol  3 mcg Intravenous Q M,W,F-HD  . gabapentin  200 mg Oral QHS  . heparin  5,000 Units Subcutaneous Q8H  . multivitamin  1 tablet Oral QHS  . sevelamer carbonate  2,400 mg Oral TID WC   Continuous Infusions: . sodium chloride 500 mL (11/26/18 0604)  . [START ON 11/27/2018] ceFEPime (MAXIPIME) IV    . metronidazole 500 mg (11/26/18 9629)  . [START ON 11/27/2018] vancomycin       LOS: 2 days   Time spent: 35 minutes   Darliss Cheney, MD [facility]  11/26/2018, 1:56 PM   To contact the attending provider between 7A-7P or the covering provider during after hours 7P-7A, please log into the web site [email or web address] and use password [email or web address].

## 2018-11-26 NOTE — Progress Notes (Signed)
Subjective:  No cos , Ordering brk.on phone / reports not remembering much about Friday.   Objective Vital signs in last 24 hours: Vitals:   11/25/18 2116 11/26/18 0040 11/26/18 0526 11/26/18 0737  BP: 138/87 132/83  131/72  Pulse:  81 76 75  Resp:  16    Temp:  98.3 F (36.8 C) 98.6 F (37 C) 98.4 F (36.9 C)  TempSrc:  Oral Oral Oral  SpO2:   94% 97%  Weight:   69.8 kg   Height:       Weight change: 0.1 kg  Physical Exam: General: alert ,Thin WM NAD , pleasant  Heart: RRR, no m,r,g  Lungs: CTA Abdomen: BS pos, Nt, ND, soft  Bs+ Extremities: L bka stump healed no edema / R LE  no edema  Dialysis Access: Pos bruit  LUA AVF    Home meds:  - amlodipine 5/ carvedilol 12.5 bid  - temazepam 30 hs/ oxycodone 5 qid prn/ gabapentin 200 hs  - aspirin 81  - sevelamer carb ac tid  - montelukast 10 prn hs  - prn's/ vitamins/ supplements   Outpt HD: [facility] MWF  4h  65.5kg  2/2 bath  Hep none P4  LUA AVF  - mircera 75 ug last 9/30  - hect 3 ug  - ven 50/wk  Problem/Plan: 1. AMS - meds vs sepsis +/- other.Now resolved  On empiric IV abx, cx's no growth so far/  WU . Per primary team.  2. SP recent bilat inguinal hernia repair  3. ESRD - HD MWF. Had full HD Fri 11/13  no need for HD today. HD tomor  OP unit =  IF No further temp  Spike  ,or wu per admit team  4. HTN/ volume - BP's wnl, cont meds as tol 5. PAD sp L AKA 6. IDDM 7. 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  CA corec 10.2 cont meds   Ernest Haber, PA-C [facility] [phone] 11/26/2018,10:32 AM  LOS: 2 days   Labs: Basic Metabolic Panel: Recent Labs  Lab 11/21/18 1027 11/24/18 1410 11/25/18 0406  NA 138 136 134*  K 5.9* 4.5 4.8  CL 99 92* 91*  CO2  --  27 27  GLUCOSE 132* 93 89  BUN 33* 19 27*  CREATININE 5.90* 4.11* 5.15*  CALCIUM  --  8.9 9.2  PHOS  --   --  6.9*   Liver Function Tests: Recent Labs  Lab 11/24/18 1410 11/25/18 0406  AST 18  --   ALT 5  --    ALKPHOS 154*  --   BILITOT 1.0  --   PROT 7.9  --   ALBUMIN 3.3* 2.8*   No results for input(s): LIPASE, AMYLASE in the last 168 hours. No results for input(s): AMMONIA in the last 168 hours. CBC: Recent Labs  Lab 11/21/18 0846 11/21/18 1027 11/24/18 1410 11/25/18 0406  WBC 7.1  --  10.7* 12.2*  NEUTROABS  --   --  8.9*  --   HGB 12.4* 11.9* 11.5* 10.1*  HCT 40.8 35.0* 35.1* 31.0*  MCV 96.2  --  90.9 92.0  PLT 173  --  131* 135*   Cardiac Enzymes: No results for input(s): CKTOTAL, CKMB, CKMBINDEX, TROPONINI in the last 168 hours. CBG: Recent Labs  Lab 11/25/18 0750 11/25/18 1158 11/25/18 1645 11/25/18 2029 11/26/18 0735  GLUCAP 83 116* 151* 138* 88    Studies/Results: Dg Chest 2  View  Result Date: 11/24/2018 CLINICAL DATA:  Altered mental status after dialysis today.  Fever. EXAM: CHEST - 2 VIEW COMPARISON:  11/24/2018 and 1415 hours, 10/23/2018 and CT chest 09/01/2014. FINDINGS: Trachea is midline. Heart is enlarged. Thoracic aorta is calcified. There are streaky densities in the perihilar regions and medial aspects of both lower lobes. No dense airspace consolidation or pleural fluid. IMPRESSION: Streaky opacification in both perihilar regions may be due to atelectasis and/or scarring. Electronically Signed   By: Lorin Picket M.D.   On: 11/24/2018 15:29   Ct Head Wo Contrast  Result Date: 11/24/2018 CLINICAL DATA:  Altered mental status during dialysis. EXAM: CT HEAD WITHOUT CONTRAST TECHNIQUE: Contiguous axial images were obtained from the base of the skull through the vertex without intravenous contrast. COMPARISON:  Head CT 01/30/2017 FINDINGS: Brain: The ventricles are in the midline without mass effect or shift. They are normal in size and configuration given the degree of mild cerebral atrophy. 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 No CT findings for acute hemispheric infarction or intracranial hemorrhage. No extra-axial fluid  collections are identified. No mass lesions. The brainstem and cerebellum are grossly normal. Vascular: Stable age advanced vascular calcifications. No aneurysm or hyperdense vessels. Skull: No skull fracture or bone lesions. Sinuses/Orbits: The paranasal sinuses and mastoid air cells are clear. The globes are intact. Other: No scalp lesions or hematoma IMPRESSION: No acute intracranial findings.  No change since prior study. Stable age advanced vascular calcifications. Electronically Signed   By: Marijo Sanes M.D.   On: 11/24/2018 19:03   Dg Chest Port 1 View  Result Date: 11/24/2018 CLINICAL DATA:  Altered mental status. EXAM: PORTABLE CHEST 1 VIEW COMPARISON:  10/23/2018 FINDINGS: 1417 hours. Low lung volumes. The cardio pericardial silhouette is enlarged. Interstitial markings are diffusely coarsened with chronic features. Bilateral streaky parahilar opacities suggest atelectasis. Patchy opacity noted left lung base compatible with atelectasis or pneumonia. No substantial pleural effusion. Bones are diffusely demineralized. Telemetry leads overlie the chest. IMPRESSION: Low volume film with chronic underlying interstitial lung disease. Streaky bilateral parahilar opacity, likely atelectasis with some additional atelectasis or pneumonia left base. Repeat upright PA and lateral chest x-ray, when patient able, recommended for more thorough assessment. Electronically Signed   By: Misty Stanley M.D.   On: 11/24/2018 14:39   Medications: . sodium chloride 500 mL (11/26/18 0604)  . [START ON 11/27/2018] ceFEPime (MAXIPIME) IV    . metronidazole 500 mg (11/26/18 2725)  . [START ON 11/27/2018] vancomycin     . amLODipine  5 mg Oral QPM  . aspirin EC  81 mg Oral QHS  . atorvastatin  40 mg Oral q1800  . carvedilol  12.5 mg Oral BID WC  . [START ON 11/27/2018] doxercalciferol  3 mcg Intravenous Q M,W,F-HD  . gabapentin  200 mg Oral QHS  . heparin  5,000 Units Subcutaneous Q8H  . multivitamin  1 tablet  Oral QHS  . sevelamer carbonate  2,400 mg Oral TID WC

## 2018-11-27 DIAGNOSIS — K4020 Bilateral inguinal hernia, without obstruction or gangrene, not specified as recurrent: Secondary | ICD-10-CM | POA: Diagnosis not present

## 2018-11-27 DIAGNOSIS — N492 Inflammatory disorders of scrotum: Secondary | ICD-10-CM | POA: Diagnosis present

## 2018-11-27 DIAGNOSIS — A419 Sepsis, unspecified organism: Secondary | ICD-10-CM | POA: Diagnosis not present

## 2018-11-27 LAB — CBC WITH DIFFERENTIAL/PLATELET
Abs Immature Granulocytes: 0.04 10*3/uL (ref 0.00–0.07)
Basophils Absolute: 0 10*3/uL (ref 0.0–0.1)
Basophils Relative: 0 %
Eosinophils Absolute: 0.3 10*3/uL (ref 0.0–0.5)
Eosinophils Relative: 3 %
HCT: 32.2 % — ABNORMAL LOW (ref 39.0–52.0)
Hemoglobin: 10.2 g/dL — ABNORMAL LOW (ref 13.0–17.0)
Immature Granulocytes: 1 %
Lymphocytes Relative: 10 %
Lymphs Abs: 0.8 10*3/uL (ref 0.7–4.0)
MCH: 29.7 pg (ref 26.0–34.0)
MCHC: 31.7 g/dL (ref 30.0–36.0)
MCV: 93.6 fL (ref 80.0–100.0)
Monocytes Absolute: 0.8 10*3/uL (ref 0.1–1.0)
Monocytes Relative: 9 %
Neutro Abs: 6.7 10*3/uL (ref 1.7–7.7)
Neutrophils Relative %: 77 %
Platelets: 166 10*3/uL (ref 150–400)
RBC: 3.44 MIL/uL — ABNORMAL LOW (ref 4.22–5.81)
RDW: 13.9 % (ref 11.5–15.5)
WBC: 8.6 10*3/uL (ref 4.0–10.5)
nRBC: 0 % (ref 0.0–0.2)

## 2018-11-27 LAB — GLUCOSE, CAPILLARY: Glucose-Capillary: 82 mg/dL (ref 70–99)

## 2018-11-27 LAB — PROCALCITONIN: Procalcitonin: 1.03 ng/mL

## 2018-11-27 MED ORDER — CEPHALEXIN 500 MG PO CAPS: 500.0000 mg | ORAL_CAPSULE | Freq: Three times a day (TID) | ORAL | 0 refills | Status: AC

## 2018-11-27 NOTE — Discharge Instructions (Signed)

## 2018-11-27 NOTE — Progress Notes (Signed)
[facility] Progress Note   Subjective:   Patient seen and examined at bedside.  Complains of scrotal pain due to swelling.  Denies SOB, CP, n/v/d, and confusion.   Objective Vitals:   11/26/18 2131 11/27/18 0003 11/27/18 0614 11/27/18 0751  BP: 119/80 117/83 124/79 125/81  Pulse: 75 70 73 71  Resp: 15 19    Temp: 98.1 F (36.7 C) 98.4 F (36.9 C) 98.2 F (36.8 C) 97.6 F (36.4 C)  TempSrc: Oral Oral Oral Oral  SpO2: 94% 92% 99% 98%  Weight:   71.3 kg   Height:       Physical Exam General:NAD, chronically ill appearing male, sitting in bed Heart:RRR, +7/5 systolic murmur Lungs:CTAB Abdomen:BS+, NTND Extremities:L BKA - no stump edema, no RLE edema Dialysis Access: LU AVF   Filed Weights   11/25/18 0310 11/26/18 0526 11/27/18 0614  Weight: 69.7 kg 69.8 kg 71.3 kg    Intake/Output Summary (Last 24 hours) at 11/27/2018 0838 Last data filed at 11/26/2018 2141 Gross per 24 hour  Intake 1145.56 ml  Output -  Net 1145.56 ml    Additional Objective Labs: Basic Metabolic Panel: Recent Labs  Lab 11/24/18 1410 11/25/18 0406 11/26/18 1128  NA 136 134* 135  K 4.5 4.8 5.1  CL 92* 91* 94*  CO2 27 27 24   GLUCOSE 93 89 110*  BUN 19 27* 48*  CREATININE 4.11* 5.15* 7.53*  CALCIUM 8.9 9.2 9.5  PHOS  --  6.9*  --    Liver Function Tests: Recent Labs  Lab 11/24/18 1410 11/25/18 0406 11/26/18 1128  AST 18  --  13*  ALT 5  --  <5  ALKPHOS 154*  --  146*  BILITOT 1.0  --  0.8  PROT 7.9  --  7.2  ALBUMIN 3.3* 2.8* 2.8*   CBC: Recent Labs  Lab 11/21/18 0846  11/24/18 1410 11/25/18 0406 11/26/18 1128  WBC 7.1  --  10.7* 12.2* 9.9  NEUTROABS  --   --  8.9*  --  8.2*  HGB 12.4*   < > 11.5* 10.1* 10.6*  HCT 40.8   < > 35.1* 31.0* 31.9*  MCV 96.2  --  90.9 92.0 90.1  PLT 173  --  131* 135* 135*   < > = values in this interval not displayed.   Blood Culture    Component Value Date/Time   SDES BLOOD RIGHT WRIST 11/24/2018 1432   SPECREQUEST   11/24/2018 1432    BOTTLES DRAWN AEROBIC AND ANAEROBIC Blood Culture adequate volume   CULT  11/24/2018 1432    NO GROWTH 2 DAYS Performed at [facility], [place] [address] [place], [place] [address]    REPTSTATUS PENDING 11/24/2018 1432   CBG: Recent Labs  Lab 11/26/18 0735 11/26/18 1209 11/26/18 1657 11/26/18 2129 11/27/18 0750  GLUCAP 88 163* 160* 169* 82   Lab Results  Component Value Date   INR 1.2 11/24/2018   INR 1.30 11/08/2016   INR 1.2 08/17/2016   Studies/Results: US Scrotum  Result Date: 11/26/2018 CLINICAL DATA:  Right testicular swelling and pain for 2 days. Recent inguinal hernia repair with mesh. EXAM: ULTRASOUND OF SCROTUM TECHNIQUE: Complete ultrasound examination of the testicles, epididymis, and other scrotal structures was performed. COMPARISON:  None. FINDINGS: Right testicle Measurements: 3.9 by 2.8 by 2.4 cm. No mass or microlithiasis visualized. Subtle heteroechogenicity. Left testicle Measurements: 4.2 by 2.3 by 2.8 cm. No mass or microlithiasis visualized. Subtle heteroechogenicity. Right epididymis:  Normal in size and appearance. Left epididymis:  Normal in size and appearance. Hydrocele:  Small bilateral hydroceles. Varicocele:  None visualized. Other: Complex region along the right inguinal canal, likely postoperative. IMPRESSION: 1. Small bilateral hydroceles with subtle heteroechogenicity in the testicular parenchyma bilaterally, but no appreciable mass or asymmetric hypervascularity to further suggest orchitis. 2. Indistinct complex appearance in the right groin, probably related to recent surgery. Electronically Signed   By: Van Clines M.D.   On: 11/26/2018 20:13    Medications: . sodium chloride 500 mL (11/27/18 0510)  . ceFEPime (MAXIPIME) IV    . metronidazole 500 mg (11/27/18 0511)  . vancomycin     . amLODipine  5 mg Oral QPM  . aspirin EC  81 mg Oral QHS  . atorvastatin  40 mg Oral q1800  . carvedilol  12.5 mg Oral BID WC   . doxercalciferol  3 mcg Intravenous Q M,W,F-HD  . gabapentin  200 mg Oral QHS  . heparin  5,000 Units Subcutaneous Q8H  . multivitamin  1 tablet Oral QHS  . sevelamer carbonate  2,400 mg Oral TID WC    Dialysis Orders: [facility] MWF 4h 65.5kg 2/2 bath Hep none P4 LUA AVF - mircera 75 ug last 9/30 - hect 3 ug - ven 50/wk  Home meds: - amlodipine 5/ carvedilol 12.5 bid - temazepam 30 hs/ oxycodone 5 qid prn/ gabapentin 200 hs - aspirin 81 - sevelamer carb ac tid - montelukast 10 prn hs - prn's/ vitamins/ supplements    Assessment/Plan: 1. AMS - etiology unknown: meds vs sepsis +/- other. Now resolved. BC negative.  Afebrile. On empiric IV abx. Per primary.  2. SP recent bilat inguinal hernia repair  3. Scrotal swelling/pain - scrotal US showed small bilateral hydroceles, no mass or signs orchitis.  Per primary 4. ESRD - HD MWF. K 5.1. HD today per regular schedule.  Either here or as OP at [place].  Is usually 1st shift, renal navigator working on getting him a 2nd shift spot if d/c. 5. HTN/ volume - BP well controlled. Does not appear volume overloaded.  6. PAD sp L AKA 7. DMT2 8. 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 Dispo - ok to d/c from renal standpoint.   Jen Mow, PA-C [facility] Pager: [phone] 11/27/2018,8:38 AM  LOS: 3 days

## 2018-11-27 NOTE — Discharge Summary (Addendum)
Physician Discharge Summary  Matthew Kline VQM:[id number] DOB: May 16, 1960 DOA: 11/24/2018  PCP: [facility]  Admit date: 11/24/2018 Discharge date: 11/27/2018  Admitted From: Home Disposition: Home  Recommendations for Outpatient Follow-up:  1. Follow up with PCP in 1-2 weeks 2. Follow-up for dialysis. 3. Please obtain BMP/CBC in one week 4. Please follow up on the following pending results:  Home Health: None Equipment/Devices: None  Discharge Condition: Stable CODE STATUS: Full code Diet recommendation: Low-sodium  Subjective: Patient seen and examined.  He still complains of scrotal pain.  No other complaint.  He has remained afebrile for last 2 days.  He is completely alert and oriented.  Brief/Interim Summary: 58 y.o.malewithPMHxsignificant forofESRD on HD(M/W/F), DM type II, CADs/p CABG, left AKA chronic low back pain, anxiety,andGERDadmitted 11/24/2018 with fever and confusion raising concerns about possible sepsis.  CT head was unremarkable.  He had temperature of 100.8 in the emergency department.  He has remained afebrile since then.  He also had leukocytosis which also resolved.  He was started on parenteral empiric antibiotics.  Blood culture drawn remain negative.  Soon after admission, patient became alert and oriented and he has remained alert and oriented so his acute metabolic encephalopathy has resolved as well.  He then started having scrotal pain.  Yesterday when I examined him, there was minimal cellulitis.  Scrotal ultrasound was obtained which showed bilateral hydrocele but no acute pathology.  Likely postoperative changes since he recently had bilateral inguinal hernia repair.  Again today, he has scrotal pain and swelling on examination but he again has remained afebrile now for more than 2 days.  On examination, he had some erythema.  I think scrotal skin cellulitis is likely the source of his infection.  Since he is afebrile with no  leukocytosis, he is going to be discharged home.  I will treat him for 5 more days of oral Keflex 500 mg 3 times daily.  He verbalized understanding and agrees with the plan of discharge.  Per nephrology coordinator, he is going to get his dialysis today as outpatient at noon.  Discharge Diagnoses:  Principal Problem:   Sepsis ([place]) Active Problems:   Anemia of chronic disease   Diabetes mellitus with renal manifestations, controlled ([place])   Type II diabetes mellitus (HCC)   CAD, multiple vessel   Acute metabolic encephalopathy   Hx of AKA (above knee amputation), left (HCC)   Thrombocytopenia (HCC)   Cellulitis of scrotum    Discharge Instructions  Discharge Instructions    Discharge patient   Complete by: As directed    Discharge disposition: 01-Home or Self Care   Discharge patient date: 11/27/2018     Allergies as of 11/27/2018      Reactions   Morphine And Related Other (See Comments)   hallucinations   Tygacil [tigecycline] Nausea And Vomiting, Other (See Comments)   Imodium [loperamide] Itching      Medication List    TAKE these medications   acetaminophen 500 MG tablet Commonly known as: TYLENOL Take 500-1,000 mg by mouth every 6 (six) hours as needed for mild pain or fever.   amLODipine 5 MG tablet Commonly known as: NORVASC Take 5 mg by mouth every evening.   aspirin 81 MG EC tablet Take 2 tablets (162 mg total) by mouth daily. What changed:   how much to take  when to take this   carvedilol 12.5 MG tablet Commonly known as: COREG Take 1 tablet (12.5 mg total) by mouth 2 (two) times  daily with a meal.   cephALEXin 500 MG capsule Commonly known as: KEFLEX Take 1 capsule (500 mg total) by mouth 3 (three) times daily for 5 days.   Darbepoetin Alfa 150 MCG/0.3ML Sosy injection Commonly known as: ARANESP Inject 0.3 mLs (150 mcg total) into the vein every Friday with hemodialysis.   gabapentin 100 MG capsule Commonly known as: NEURONTIN Take 200  mg by mouth daily.   heparin 1000 UNIT/ML injection Inject 4 mLs into the vein See admin instructions. Every hemodialysis treatment   hydrOXYzine 50 MG tablet Commonly known as: ATARAX/VISTARIL Take 50 mg by mouth 3 (three) times daily as needed for itching.   multivitamin Tabs tablet Take 1 tablet by mouth at bedtime.   oxyCODONE 5 MG immediate release tablet Commonly known as: Oxy IR/ROXICODONE Take 1 tablet (5 mg total) by mouth every 6 (six) hours as needed for moderate pain, severe pain or breakthrough pain.   sevelamer carbonate 800 MG tablet Commonly known as: RENVELA Take 1,600-3,200 mg by mouth 3 (three) times daily with meals. 2-4 tabs with each meal   Singulair 10 MG tablet Generic drug: montelukast Take 10 mg by mouth at bedtime as needed.   temazepam 30 MG capsule Commonly known as: RESTORIL Take 30 mg by mouth at bedtime.      [place] Follow up in 1 week(s).   Contact information: [place] [address] [phone]        Wellington Hampshire, MD .   Specialty: Cardiology Contact information: [address] [place] [address] [phone]          Allergies  Allergen Reactions  . Morphine And Related Other (See Comments)    hallucinations  . Tygacil [Tigecycline] Nausea And Vomiting and Other (See Comments)  . Imodium [Loperamide] Itching    Consultations: Nephrology   Procedures/Studies: Dg Chest 2 View  Result Date: 11/24/2018 CLINICAL DATA:  Altered mental status after dialysis today.  Fever. EXAM: CHEST - 2 VIEW COMPARISON:  11/24/2018 and 1415 hours, 10/23/2018 and CT chest 09/01/2014. FINDINGS: Trachea is midline. Heart is enlarged. Thoracic aorta is calcified. There are streaky densities in the perihilar regions and medial aspects of both lower lobes. No dense airspace consolidation or pleural fluid. IMPRESSION: Streaky opacification in both perihilar regions  may be due to atelectasis and/or scarring. Electronically Signed   By: Lorin Picket M.D.   On: 11/24/2018 15:29   Ct Head Wo Contrast  Result Date: 11/24/2018 CLINICAL DATA:  Altered mental status during dialysis. EXAM: CT HEAD WITHOUT CONTRAST TECHNIQUE: Contiguous axial images were obtained from the base of the skull through the vertex without intravenous contrast. COMPARISON:  Head CT 01/30/2017 FINDINGS: Brain: The ventricles are in the midline without mass effect or shift. They are normal in size and configuration given the degree of mild cerebral atrophy. 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 No CT findings for acute hemispheric infarction or intracranial hemorrhage. No extra-axial fluid collections are identified. No mass lesions. The brainstem and cerebellum are grossly normal. Vascular: Stable age advanced vascular calcifications. No aneurysm or hyperdense vessels. Skull: No skull fracture or bone lesions. Sinuses/Orbits: The paranasal sinuses and mastoid air cells are clear. The globes are intact. Other: No scalp lesions or hematoma IMPRESSION: No acute intracranial findings.  No change since prior study. Stable age advanced vascular calcifications. Electronically Signed   By: Marijo Sanes M.D.   On: 11/24/2018 19:03  US Scrotum  Result Date: 11/26/2018 CLINICAL DATA:  Right testicular swelling and pain for 2 days. Recent inguinal hernia repair with mesh. EXAM: ULTRASOUND OF SCROTUM TECHNIQUE: Complete ultrasound examination of the testicles, epididymis, and other scrotal structures was performed. COMPARISON:  None. FINDINGS: Right testicle Measurements: 3.9 by 2.8 by 2.4 cm. No mass or microlithiasis visualized. Subtle heteroechogenicity. Left testicle Measurements: 4.2 by 2.3 by 2.8 cm. No mass or microlithiasis visualized. Subtle heteroechogenicity. Right epididymis:  Normal in size and appearance. Left epididymis:  Normal in size and appearance. Hydrocele:  Small  bilateral hydroceles. Varicocele:  None visualized. Other: Complex region along the right inguinal canal, likely postoperative. IMPRESSION: 1. Small bilateral hydroceles with subtle heteroechogenicity in the testicular parenchyma bilaterally, but no appreciable mass or asymmetric hypervascularity to further suggest orchitis. 2. Indistinct complex appearance in the right groin, probably related to recent surgery. Electronically Signed   By: Van Clines M.D.   On: 11/26/2018 20:13   Dg Chest Port 1 View  Result Date: 11/24/2018 CLINICAL DATA:  Altered mental status. EXAM: PORTABLE CHEST 1 VIEW COMPARISON:  10/23/2018 FINDINGS: 1417 hours. Low lung volumes. The cardio pericardial silhouette is enlarged. Interstitial markings are diffusely coarsened with chronic features. Bilateral streaky parahilar opacities suggest atelectasis. Patchy opacity noted left lung base compatible with atelectasis or pneumonia. No substantial pleural effusion. Bones are diffusely demineralized. Telemetry leads overlie the chest. IMPRESSION: Low volume film with chronic underlying interstitial lung disease. Streaky bilateral parahilar opacity, likely atelectasis with some additional atelectasis or pneumonia left base. Repeat upright PA and lateral chest x-ray, when patient able, recommended for more thorough assessment. Electronically Signed   By: Misty Stanley M.D.   On: 11/24/2018 14:39   Xr Foot 2 Views Right  Result Date: 10/31/2018 Radiographs of right foot without concerning sign of osteomyelitis. Does have calcification of vessels out to metatarsals.      Discharge Exam: Vitals:   11/27/18 0614 11/27/18 0751  BP: 124/79 125/81  Pulse: 73 71  Resp:    Temp: 98.2 F (36.8 C) 97.6 F (36.4 C)  SpO2: 99% 98%   Vitals:   11/26/18 2131 11/27/18 0003 11/27/18 0614 11/27/18 0751  BP: 119/80 117/83 124/79 125/81  Pulse: 75 70 73 71  Resp: 15 19    Temp: 98.1 F (36.7 C) 98.4 F (36.9 C) 98.2 F (36.8 C)  97.6 F (36.4 C)  TempSrc: Oral Oral Oral Oral  SpO2: 94% 92% 99% 98%  Weight:   71.3 kg   Height:        General: Pt is alert, awake, not in acute distress Cardiovascular: RRR, S1/S2 +, no rubs, no gallops Respiratory: CTA bilaterally, no wheezing, no rhonchi Abdominal: Soft, NT, ND, bowel sounds +, scrotal skin erythema with minimal tenderness but no warmth or any open sores. Extremities: no edema, no cyanosis.  Left AKA    The results of significant diagnostics from this hospitalization (including imaging, microbiology, ancillary and laboratory) are listed below for reference.     Microbiology: Recent Results (from the past 240 hour(s))  Novel Coronavirus, NAA (Hosp order, Send-out to Ref Lab; TAT 18-24 hrs     Status: None   Collection Time: 11/17/18 12:21 PM   Specimen: Nasopharyngeal Swab; Respiratory  Result Value Ref Range Status   SARS-CoV-2, NAA NOT DETECTED NOT DETECTED Final    Comment: (NOTE) Testing was performed using the cobas(R) SARS-CoV-2 test. This nucleic acid amplification test was developed and its performance characteristics determined by [employer] and [employer]. Nucleic  acid amplification tests include PCR and TMA. This test has not been FDA cleared or approved. This test has been authorized by FDA under an Emergency Use Authorization (EUA). This test is only authorized for the duration of time the declaration that circumstances exist justifying the authorization of the emergency use of in vitro diagnostic tests for detection of SARS-CoV-2 virus and/or diagnosis of COVID-19 infection under section 564(b)(1) of the Act, 21 U.S.C. 035KKX-3(G) (1), unless the authorization is terminated or revoked sooner. When diagnostic testing is negative, the possibility of a false negative result should be considered in the context of a patient's recent exposures and the presence of clinical signs and symptoms consistent with COVID-19. An individual without s ymptoms  of COVID- 19 and who is not shedding SARS-CoV-2 virus would expect to have a negative (not detected) result in this assay. Performed At: [facility] [address] [place], [place] [id number] Rush Farmer MD RC:[id number]    [place]  Final    Comment: Performed at [facility], [place] [address] [place], [place] [address]  SARS CORONAVIRUS 2 (TAT 6-24 HRS) Nasopharyngeal Nasopharyngeal Swab     Status: None   Collection Time: 11/24/18  2:12 PM   Specimen: Nasopharyngeal Swab  Result Value Ref Range Status   SARS Coronavirus 2 NEGATIVE NEGATIVE Final    Comment: (NOTE) SARS-CoV-2 target nucleic acids are NOT DETECTED. The SARS-CoV-2 RNA is generally detectable in upper and lower respiratory specimens during the acute phase of infection. Negative results do not preclude SARS-CoV-2 infection, do not rule out co-infections with other pathogens, and should not be used as the sole basis for treatment or other patient management decisions. Negative results must be combined with clinical observations, patient history, and epidemiological information. The expected result is Negative. Fact Sheet for Patients: SugarRoll.be Fact Sheet for Healthcare Providers: https://www.woods-mathews.com/ This test is not yet approved or cleared by the [place] FDA and  has been authorized for detection and/or diagnosis of SARS-CoV-2 by FDA under an Emergency Use Authorization (EUA). This EUA will remain  in effect (meaning this test can be used) for the duration of the COVID-19 declaration under Section 56 4(b)(1) of the Act, 21 U.S.C. section 360bbb-3(b)(1), unless the authorization is terminated or revoked sooner. Performed at [facility], [place] [address], [place], [place] [address]   Blood Culture (routine x 2)     Status: None (Preliminary result)   Collection Time: 11/24/18  2:31 PM   Specimen: BLOOD   Result Value Ref Range Status   Specimen Description BLOOD RIGHT ANTECUBITAL  Final   Special Requests   Final    BOTTLES DRAWN AEROBIC AND ANAEROBIC Blood Culture adequate volume   Culture   Final    NO GROWTH 2 DAYS Performed at [facility], [place] [address] [place], [place] [address]    Report Status PENDING  Incomplete  Blood Culture (routine x 2)     Status: None (Preliminary result)   Collection Time: 11/24/18  2:32 PM   Specimen: BLOOD RIGHT WRIST  Result Value Ref Range Status   Specimen Description BLOOD RIGHT WRIST  Final   Special Requests   Final    BOTTLES DRAWN AEROBIC AND ANAEROBIC Blood Culture adequate volume   Culture   Final    NO GROWTH 2 DAYS Performed at [facility], [place] [address], [place], [place] [address]    Report Status PENDING  Incomplete  MRSA PCR Screening  Status: None   Collection Time: 11/26/18  5:55 AM   Specimen: Nasopharyngeal  Result Value Ref Range Status   MRSA by PCR NEGATIVE NEGATIVE Final    Comment:        The GeneXpert MRSA Assay (FDA approved for NASAL specimens only), is one component of a comprehensive MRSA colonization surveillance program. It is not intended to diagnose MRSA infection nor to guide or monitor treatment for MRSA infections. Performed at [facility], [place] [address], [place], [place] [address]      Labs: BNP (last 3 results) No results for input(s): BNP in the last 8760 hours. Basic Metabolic Panel: Recent Labs  Lab 11/21/18 1027 11/24/18 1410 11/25/18 0406 11/26/18 1128  NA 138 136 134* 135  K 5.9* 4.5 4.8 5.1  CL 99 92* 91* 94*  CO2  --  27 27 24   GLUCOSE 132* 93 89 110*  BUN 33* 19 27* 48*  CREATININE 5.90* 4.11* 5.15* 7.53*  CALCIUM  --  8.9 9.2 9.5  PHOS  --   --  6.9*  --    Liver Function Tests: Recent Labs  Lab 11/24/18 1410 11/25/18 0406 11/26/18 1128  AST 18  --  13*  ALT 5  --  <5  ALKPHOS 154*  --  146*  BILITOT 1.0  --  0.8  PROT 7.9  --  7.2   ALBUMIN 3.3* 2.8* 2.8*   No results for input(s): LIPASE, AMYLASE in the last 168 hours. No results for input(s): AMMONIA in the last 168 hours. CBC: Recent Labs  Lab 11/21/18 0846 11/21/18 1027 11/24/18 1410 11/25/18 0406 11/26/18 1128  WBC 7.1  --  10.7* 12.2* 9.9  NEUTROABS  --   --  8.9*  --  8.2*  HGB 12.4* 11.9* 11.5* 10.1* 10.6*  HCT 40.8 35.0* 35.1* 31.0* 31.9*  MCV 96.2  --  90.9 92.0 90.1  PLT 173  --  131* 135* 135*   Cardiac Enzymes: No results for input(s): CKTOTAL, CKMB, CKMBINDEX, TROPONINI in the last 168 hours. BNP: Invalid input(s): POCBNP CBG: Recent Labs  Lab 11/26/18 0735 11/26/18 1209 11/26/18 1657 11/26/18 2129 11/27/18 0750  GLUCAP 88 163* 160* 169* 82   D-Dimer No results for input(s): DDIMER in the last 72 hours. Hgb A1c No results for input(s): HGBA1C in the last 72 hours. Lipid Profile No results for input(s): CHOL, HDL, LDLCALC, TRIG, CHOLHDL, LDLDIRECT in the last 72 hours. Thyroid function studies No results for input(s): TSH, T4TOTAL, T3FREE, THYROIDAB in the last 72 hours.  Invalid input(s): FREET3 Anemia work up No results for input(s): VITAMINB12, FOLATE, FERRITIN, TIBC, IRON, RETICCTPCT in the last 72 hours. Urinalysis    Component Value Date/Time   COLORURINE YELLOW 01/29/2017 1141   APPEARANCEUR CLOUDY (A) 01/29/2017 1141   APPEARANCEUR Clear 07/12/2013 0918   LABSPEC 1.011 01/29/2017 1141   PHURINE 8.0 01/29/2017 1141   GLUCOSEU 50 (A) 01/29/2017 1141   HGBUR SMALL (A) 01/29/2017 1141   BILIRUBINUR NEGATIVE 01/29/2017 1141   BILIRUBINUR Negative 07/12/2013 0918   KETONESUR NEGATIVE 01/29/2017 1141   PROTEINUR 100 (A) 01/29/2017 1141   UROBILINOGEN 0.2 01/03/2012 1335   NITRITE NEGATIVE 01/29/2017 1141   LEUKOCYTESUR LARGE (A) 01/29/2017 1141   LEUKOCYTESUR Negative 07/12/2013 0918   Sepsis Labs Invalid input(s): PROCALCITONIN,  WBC,  LACTICIDVEN Microbiology Recent Results (from the past 240 hour(s))  Novel  Coronavirus, NAA (Hosp order, Send-out to Ref Lab; TAT 18-24 hrs     Status: None  Collection Time: 11/17/18 12:21 PM   Specimen: Nasopharyngeal Swab; Respiratory  Result Value Ref Range Status   SARS-CoV-2, NAA NOT DETECTED NOT DETECTED Final    Comment: (NOTE) Testing was performed using the cobas(R) SARS-CoV-2 test. This nucleic acid amplification test was developed and its performance characteristics determined by [employer], [employer] and [employer]. Nucleic acid amplification tests include PCR and TMA. This test has not been FDA cleared or approved. This test has been authorized by FDA under an Emergency Use Authorization (EUA). This test is only authorized for the duration of time the declaration that circumstances exist justifying the authorization of the emergency use of in vitro diagnostic tests for detection of SARS-CoV-2 virus and/or diagnosis of COVID-19 infection under section 564(b)(1) of the Act, 21 U.S.C. 540GQQ-7(Y) (1), unless the authorization is terminated or revoked sooner. When diagnostic testing is negative, the possibility of a false negative result should be considered in the context of a patient's recent exposures and the presence of clinical signs and symptoms consistent with COVID-19. An individual without s ymptoms of COVID- 19 and who is not shedding SARS-CoV-2 virus would expect to have a negative (not detected) result in this assay. Performed At: [facility] [address] [place], [place] [id number] Rush Farmer MD TI:[id number]    [place]  Final    Comment: Performed at [facility], [place] [address], [place], [place] [address]  SARS CORONAVIRUS 2 (TAT 6-24 HRS) Nasopharyngeal Nasopharyngeal Swab     Status: None   Collection Time: 11/24/18  2:12 PM   Specimen: Nasopharyngeal Swab  Result Value Ref Range Status   SARS Coronavirus 2 NEGATIVE NEGATIVE Final    Comment: (NOTE) SARS-CoV-2 target nucleic acids are NOT  DETECTED. The SARS-CoV-2 RNA is generally detectable in upper and lower respiratory specimens during the acute phase of infection. Negative results do not preclude SARS-CoV-2 infection, do not rule out co-infections with other pathogens, and should not be used as the sole basis for treatment or other patient management decisions. Negative results must be combined with clinical observations, patient history, and epidemiological information. The expected result is Negative. Fact Sheet for Patients: SugarRoll.be Fact Sheet for Healthcare Providers: https://www.woods-mathews.com/ This test is not yet approved or cleared by the [place] FDA and  has been authorized for detection and/or diagnosis of SARS-CoV-2 by FDA under an Emergency Use Authorization (EUA). This EUA will remain  in effect (meaning this test can be used) for the duration of the COVID-19 declaration under Section 56 4(b)(1) of the Act, 21 U.S.C. section 360bbb-3(b)(1), unless the authorization is terminated or revoked sooner. Performed at [facility], [place] [address] [place], [place] [address]   Blood Culture (routine x 2)     Status: None (Preliminary result)   Collection Time: 11/24/18  2:31 PM   Specimen: BLOOD  Result Value Ref Range Status   Specimen Description BLOOD RIGHT ANTECUBITAL  Final   Special Requests   Final    BOTTLES DRAWN AEROBIC AND ANAEROBIC Blood Culture adequate volume   Culture   Final    NO GROWTH 2 DAYS Performed at [facility], [place] [address], [place], [place] [address]    Report Status PENDING  Incomplete  Blood Culture (routine x 2)     Status: None (Preliminary result)   Collection Time: 11/24/18  2:32 PM   Specimen: BLOOD RIGHT WRIST  Result Value Ref Range Status   Specimen Description BLOOD RIGHT WRIST  Final   Special Requests  Final    BOTTLES DRAWN AEROBIC AND ANAEROBIC Blood Culture adequate volume   Culture   Final     NO GROWTH 2 DAYS Performed at [facility], [place] [address], [place], [place] [address]    Report Status PENDING  Incomplete  MRSA PCR Screening     Status: None   Collection Time: 11/26/18  5:55 AM   Specimen: Nasopharyngeal  Result Value Ref Range Status   MRSA by PCR NEGATIVE NEGATIVE Final    Comment:        The GeneXpert MRSA Assay (FDA approved for NASAL specimens only), is one component of a comprehensive MRSA colonization surveillance program. It is not intended to diagnose MRSA infection nor to guide or monitor treatment for MRSA infections. Performed at [facility], [place] [address], [place], [place] [address]      Time coordinating discharge: Over 30 minutes  SIGNED:   Darliss Cheney, MD  [facility] 11/27/2018, 9:04 AM  If 7PM-7AM, please contact night-coverage [email or web address] Password [email or web address]

## 2018-11-27 NOTE — Progress Notes (Signed)
Renal Navigator received notification from Renal PA/L. Leron Croak that patient may be ready for discharge today. Navigator confirmed with Attending/Dr. Doristine Bosworth that patient is cleared for discharge pending HD today. Renal Navigator has rescheduled his OP HD appointment at [facility] clinic for 12:20pm. He needs to arrive at 12:00pm. Renal Navigator spoke with patient, who initially was unsure if he would get a ride home. Navigator offered ride to clinic. Patient made some phone calls and notified Navigator that he has found a pastor friend to pick him up from the hospital and take him to OP HD clinic for treatment today. He states his friend will also pick him up from the clinic to take him home afterwards. He states no issues with driving himself on Wednesday.   Alphonzo Cruise, [place] Renal Navigator [phone]

## 2018-11-29 LAB — CULTURE, BLOOD (ROUTINE X 2)
Culture: NO GROWTH
Culture: NO GROWTH
Special Requests: ADEQUATE
Special Requests: ADEQUATE

## 2018-12-12 ENCOUNTER — Ambulatory Visit: Payer: Medicare HMO | Admitting: Family

## 2018-12-29 ENCOUNTER — Emergency Department (HOSPITAL_COMMUNITY): Payer: Medicare HMO

## 2018-12-29 ENCOUNTER — Encounter (HOSPITAL_COMMUNITY): Payer: Self-pay | Admitting: Emergency Medicine

## 2018-12-29 ENCOUNTER — Inpatient Hospital Stay (HOSPITAL_COMMUNITY)
Admission: EM | Admit: 2018-12-29 | Discharge: 2019-01-05 | DRG: 239 | Disposition: A | Discharge status: Home Health | Payer: Medicare HMO | Attending: Internal Medicine | Admitting: Internal Medicine

## 2018-12-29 ENCOUNTER — Other Ambulatory Visit: Payer: Self-pay

## 2018-12-29 DIAGNOSIS — I2510 Atherosclerotic heart disease of native coronary artery without angina pectoris: Secondary | ICD-10-CM | POA: Diagnosis present

## 2018-12-29 DIAGNOSIS — D631 Anemia in chronic kidney disease: Secondary | ICD-10-CM | POA: Diagnosis present

## 2018-12-29 DIAGNOSIS — E785 Hyperlipidemia, unspecified: Secondary | ICD-10-CM | POA: Diagnosis present

## 2018-12-29 DIAGNOSIS — Z992 Dependence on renal dialysis: Secondary | ICD-10-CM | POA: Diagnosis not present

## 2018-12-29 DIAGNOSIS — I252 Old myocardial infarction: Secondary | ICD-10-CM | POA: Diagnosis not present

## 2018-12-29 DIAGNOSIS — Z79891 Long term (current) use of opiate analgesic: Secondary | ICD-10-CM

## 2018-12-29 DIAGNOSIS — E1142 Type 2 diabetes mellitus with diabetic polyneuropathy: Secondary | ICD-10-CM | POA: Diagnosis present

## 2018-12-29 DIAGNOSIS — Z89612 Acquired absence of left leg above knee: Secondary | ICD-10-CM | POA: Diagnosis not present

## 2018-12-29 DIAGNOSIS — L299 Pruritus, unspecified: Secondary | ICD-10-CM | POA: Diagnosis present

## 2018-12-29 DIAGNOSIS — Z951 Presence of aortocoronary bypass graft: Secondary | ICD-10-CM

## 2018-12-29 DIAGNOSIS — T402X5A Adverse effect of other opioids, initial encounter: Secondary | ICD-10-CM | POA: Diagnosis not present

## 2018-12-29 DIAGNOSIS — E1122 Type 2 diabetes mellitus with diabetic chronic kidney disease: Secondary | ICD-10-CM | POA: Diagnosis present

## 2018-12-29 DIAGNOSIS — Z7982 Long term (current) use of aspirin: Secondary | ICD-10-CM

## 2018-12-29 DIAGNOSIS — N186 End stage renal disease: Secondary | ICD-10-CM | POA: Diagnosis present

## 2018-12-29 DIAGNOSIS — E875 Hyperkalemia: Secondary | ICD-10-CM | POA: Diagnosis present

## 2018-12-29 DIAGNOSIS — Z8249 Family history of ischemic heart disease and other diseases of the circulatory system: Secondary | ICD-10-CM

## 2018-12-29 DIAGNOSIS — Z20828 Contact with and (suspected) exposure to other viral communicable diseases: Secondary | ICD-10-CM | POA: Diagnosis present

## 2018-12-29 DIAGNOSIS — M79609 Pain in unspecified limb: Secondary | ICD-10-CM

## 2018-12-29 DIAGNOSIS — F419 Anxiety disorder, unspecified: Secondary | ICD-10-CM | POA: Diagnosis present

## 2018-12-29 DIAGNOSIS — Z8673 Personal history of transient ischemic attack (TIA), and cerebral infarction without residual deficits: Secondary | ICD-10-CM

## 2018-12-29 DIAGNOSIS — L02419 Cutaneous abscess of limb, unspecified: Secondary | ICD-10-CM | POA: Diagnosis present

## 2018-12-29 DIAGNOSIS — Z89411 Acquired absence of right great toe: Secondary | ICD-10-CM

## 2018-12-29 DIAGNOSIS — L02611 Cutaneous abscess of right foot: Secondary | ICD-10-CM | POA: Diagnosis present

## 2018-12-29 DIAGNOSIS — Z888 Allergy status to other drugs, medicaments and biological substances status: Secondary | ICD-10-CM

## 2018-12-29 DIAGNOSIS — Z823 Family history of stroke: Secondary | ICD-10-CM

## 2018-12-29 DIAGNOSIS — Z9115 Patient's noncompliance with renal dialysis: Secondary | ICD-10-CM

## 2018-12-29 DIAGNOSIS — I132 Hypertensive heart and chronic kidney disease with heart failure and with stage 5 chronic kidney disease, or end stage renal disease: Secondary | ICD-10-CM | POA: Diagnosis present

## 2018-12-29 DIAGNOSIS — Z681 Body mass index (BMI) 19 or less, adult: Secondary | ICD-10-CM

## 2018-12-29 DIAGNOSIS — L03119 Cellulitis of unspecified part of limb: Secondary | ICD-10-CM | POA: Diagnosis present

## 2018-12-29 DIAGNOSIS — M479 Spondylosis, unspecified: Secondary | ICD-10-CM | POA: Diagnosis present

## 2018-12-29 DIAGNOSIS — E43 Unspecified severe protein-calorie malnutrition: Secondary | ICD-10-CM

## 2018-12-29 DIAGNOSIS — I10 Essential (primary) hypertension: Secondary | ICD-10-CM | POA: Diagnosis present

## 2018-12-29 DIAGNOSIS — E1152 Type 2 diabetes mellitus with diabetic peripheral angiopathy with gangrene: Principal | ICD-10-CM | POA: Diagnosis present

## 2018-12-29 DIAGNOSIS — E11621 Type 2 diabetes mellitus with foot ulcer: Secondary | ICD-10-CM | POA: Diagnosis present

## 2018-12-29 DIAGNOSIS — G8929 Other chronic pain: Secondary | ICD-10-CM | POA: Diagnosis present

## 2018-12-29 DIAGNOSIS — M79604 Pain in right leg: Secondary | ICD-10-CM

## 2018-12-29 DIAGNOSIS — I4581 Long QT syndrome: Secondary | ICD-10-CM | POA: Diagnosis present

## 2018-12-29 DIAGNOSIS — M353 Polymyalgia rheumatica: Secondary | ICD-10-CM | POA: Diagnosis present

## 2018-12-29 DIAGNOSIS — Z9089 Acquired absence of other organs: Secondary | ICD-10-CM

## 2018-12-29 DIAGNOSIS — N2581 Secondary hyperparathyroidism of renal origin: Secondary | ICD-10-CM | POA: Diagnosis present

## 2018-12-29 DIAGNOSIS — Z885 Allergy status to narcotic agent status: Secondary | ICD-10-CM

## 2018-12-29 DIAGNOSIS — L03031 Cellulitis of right toe: Secondary | ICD-10-CM | POA: Diagnosis present

## 2018-12-29 DIAGNOSIS — E440 Moderate protein-calorie malnutrition: Secondary | ICD-10-CM | POA: Diagnosis present

## 2018-12-29 DIAGNOSIS — L02415 Cutaneous abscess of right lower limb: Secondary | ICD-10-CM | POA: Diagnosis present

## 2018-12-29 DIAGNOSIS — I5032 Chronic diastolic (congestive) heart failure: Secondary | ICD-10-CM | POA: Diagnosis present

## 2018-12-29 DIAGNOSIS — Z9049 Acquired absence of other specified parts of digestive tract: Secondary | ICD-10-CM

## 2018-12-29 DIAGNOSIS — Z8619 Personal history of other infectious and parasitic diseases: Secondary | ICD-10-CM

## 2018-12-29 DIAGNOSIS — L97518 Non-pressure chronic ulcer of other part of right foot with other specified severity: Secondary | ICD-10-CM | POA: Diagnosis present

## 2018-12-29 DIAGNOSIS — Z8041 Family history of malignant neoplasm of ovary: Secondary | ICD-10-CM

## 2018-12-29 DIAGNOSIS — S88111A Complete traumatic amputation at level between knee and ankle, right lower leg, initial encounter: Secondary | ICD-10-CM

## 2018-12-29 DIAGNOSIS — Y92230 Patient room in hospital as the place of occurrence of the external cause: Secondary | ICD-10-CM | POA: Diagnosis not present

## 2018-12-29 DIAGNOSIS — L03115 Cellulitis of right lower limb: Secondary | ICD-10-CM | POA: Diagnosis present

## 2018-12-29 DIAGNOSIS — Z7901 Long term (current) use of anticoagulants: Secondary | ICD-10-CM

## 2018-12-29 DIAGNOSIS — G4700 Insomnia, unspecified: Secondary | ICD-10-CM | POA: Diagnosis present

## 2018-12-29 DIAGNOSIS — Z79899 Other long term (current) drug therapy: Secondary | ICD-10-CM

## 2018-12-29 DIAGNOSIS — M65071 Abscess of tendon sheath, right ankle and foot: Secondary | ICD-10-CM

## 2018-12-29 DIAGNOSIS — E119 Type 2 diabetes mellitus without complications: Secondary | ICD-10-CM

## 2018-12-29 DIAGNOSIS — R410 Disorientation, unspecified: Secondary | ICD-10-CM | POA: Diagnosis not present

## 2018-12-29 DIAGNOSIS — F17210 Nicotine dependence, cigarettes, uncomplicated: Secondary | ICD-10-CM | POA: Diagnosis present

## 2018-12-29 LAB — COMPREHENSIVE METABOLIC PANEL
ALT: 51 U/L — ABNORMAL HIGH (ref 0–44)
AST: 84 U/L — ABNORMAL HIGH (ref 15–41)
Albumin: 3 g/dL — ABNORMAL LOW (ref 3.5–5.0)
Alkaline Phosphatase: 143 U/L — ABNORMAL HIGH (ref 38–126)
Anion gap: 19 — ABNORMAL HIGH (ref 5–15)
BUN: 53 mg/dL — ABNORMAL HIGH (ref 6–20)
CO2: 24 mmol/L (ref 22–32)
Calcium: 9.6 mg/dL (ref 8.9–10.3)
Chloride: 95 mmol/L — ABNORMAL LOW (ref 98–111)
Creatinine, Ser: 9.94 mg/dL — ABNORMAL HIGH (ref 0.61–1.24)
GFR calc Af Amer: 6 mL/min — ABNORMAL LOW (ref >60)
GFR calc non Af Amer: 5 mL/min — ABNORMAL LOW (ref >60)
Glucose, Bld: 125 mg/dL — ABNORMAL HIGH (ref 70–99)
Potassium: 7 mmol/L (ref 3.5–5.1)
Sodium: 138 mmol/L (ref 135–145)
Total Bilirubin: 1.1 mg/dL (ref 0.3–1.2)
Total Protein: 8 g/dL (ref 6.5–8.1)

## 2018-12-29 LAB — CBC WITH DIFFERENTIAL/PLATELET
Abs Immature Granulocytes: 0.04 10*3/uL (ref 0.00–0.07)
Basophils Absolute: 0.1 10*3/uL (ref 0.0–0.1)
Basophils Relative: 1 %
Eosinophils Absolute: 0 10*3/uL (ref 0.0–0.5)
Eosinophils Relative: 0 %
HCT: 32.1 % — ABNORMAL LOW (ref 39.0–52.0)
Hemoglobin: 10.3 g/dL — ABNORMAL LOW (ref 13.0–17.0)
Immature Granulocytes: 0 %
Lymphocytes Relative: 10 %
Lymphs Abs: 0.9 10*3/uL (ref 0.7–4.0)
MCH: 30.1 pg (ref 26.0–34.0)
MCHC: 32.1 g/dL (ref 30.0–36.0)
MCV: 93.9 fL (ref 80.0–100.0)
Monocytes Absolute: 1 10*3/uL (ref 0.1–1.0)
Monocytes Relative: 11 %
Neutro Abs: 7 10*3/uL (ref 1.7–7.7)
Neutrophils Relative %: 78 %
Platelets: 191 10*3/uL (ref 150–400)
RBC: 3.42 MIL/uL — ABNORMAL LOW (ref 4.22–5.81)
RDW: 14.2 % (ref 11.5–15.5)
WBC: 8.9 10*3/uL (ref 4.0–10.5)
nRBC: 0 % (ref 0.0–0.2)

## 2018-12-29 LAB — RESPIRATORY PANEL BY RT PCR (FLU A&B, COVID)
Influenza A by PCR: NEGATIVE
Influenza B by PCR: NEGATIVE
SARS Coronavirus 2 by RT PCR: NEGATIVE

## 2018-12-29 LAB — LACTIC ACID, PLASMA: Lactic Acid, Venous: 1.9 mmol/L (ref 0.5–1.9)

## 2018-12-29 MED ORDER — GABAPENTIN 100 MG PO CAPS
200.0000 mg | ORAL_CAPSULE | Freq: Every day | ORAL | Status: DC
  Administered 2018-12-30 – 2019-01-05 (×8): 200 mg via ORAL
  Filled 2018-12-29 (×8): qty 2

## 2018-12-29 MED ORDER — INSULIN ASPART 100 UNIT/ML IV SOLN
5.0000 [IU] | Freq: Once | INTRAVENOUS | Status: AC
  Administered 2018-12-29: 5 [IU] via INTRAVENOUS

## 2018-12-29 MED ORDER — CEFAZOLIN SODIUM-DEXTROSE 1-4 GM/50ML-% IV SOLN: 1.0000 g | Freq: Once | INTRAVENOUS | Status: DC

## 2018-12-29 MED ORDER — TEMAZEPAM 15 MG PO CAPS
30.0000 mg | ORAL_CAPSULE | Freq: Every day | ORAL | Status: DC
  Administered 2018-12-30 – 2019-01-04 (×7): 30 mg via ORAL
  Filled 2018-12-29 (×5): qty 2
  Filled 2018-12-29: qty 1
  Filled 2018-12-29: qty 2

## 2018-12-29 MED ORDER — CARVEDILOL 12.5 MG PO TABS
12.5000 mg | ORAL_TABLET | Freq: Two times a day (BID) | ORAL | Status: DC
  Administered 2018-12-30 – 2019-01-05 (×12): 12.5 mg via ORAL
  Filled 2018-12-29 (×14): qty 1

## 2018-12-29 MED ORDER — HYDROXYZINE HCL 25 MG PO TABS
50.0000 mg | ORAL_TABLET | Freq: Three times a day (TID) | ORAL | Status: DC | PRN
  Administered 2018-12-30 (×2): 50 mg via ORAL
  Filled 2018-12-29 (×2): qty 2

## 2018-12-29 MED ORDER — CHLORHEXIDINE GLUCONATE CLOTH 2 % EX PADS
6.0000 | MEDICATED_PAD | Freq: Every day | CUTANEOUS | Status: DC
  Administered 2018-12-31 – 2019-01-01 (×2): 6 via TOPICAL

## 2018-12-29 MED ORDER — PIPERACILLIN-TAZOBACTAM 3.375 G IVPB 30 MIN
3.3750 g | Freq: Once | INTRAVENOUS | Status: AC
  Administered 2018-12-30: 3.375 g via INTRAVENOUS
  Filled 2018-12-29 (×2): qty 50

## 2018-12-29 MED ORDER — OXYCODONE HCL 5 MG PO TABS
5.0000 mg | ORAL_TABLET | Freq: Four times a day (QID) | ORAL | Status: DC | PRN
  Administered 2018-12-30: 5 mg via ORAL
  Filled 2018-12-29 (×2): qty 1

## 2018-12-29 MED ORDER — HEPARIN SODIUM (PORCINE) 5000 UNIT/ML IJ SOLN
5000.0000 [IU] | Freq: Three times a day (TID) | INTRAMUSCULAR | Status: DC
  Administered 2018-12-30 – 2019-01-05 (×17): 5000 [IU] via SUBCUTANEOUS
  Filled 2018-12-29 (×16): qty 1

## 2018-12-29 MED ORDER — SODIUM ZIRCONIUM CYCLOSILICATE 10 G PO PACK
10.0000 g | PACK | Freq: Once | ORAL | Status: AC
  Administered 2018-12-29: 10 g via ORAL
  Filled 2018-12-29: qty 1

## 2018-12-29 MED ORDER — MONTELUKAST SODIUM 10 MG PO TABS
10.0000 mg | ORAL_TABLET | Freq: Every day | ORAL | Status: DC
  Administered 2018-12-30 – 2019-01-04 (×7): 10 mg via ORAL
  Filled 2018-12-29 (×7): qty 1

## 2018-12-29 MED ORDER — SEVELAMER CARBONATE 800 MG PO TABS
3200.0000 mg | ORAL_TABLET | Freq: Three times a day (TID) | ORAL | Status: DC
  Administered 2018-12-30 – 2019-01-05 (×13): 3200 mg via ORAL
  Filled 2018-12-29 (×17): qty 4

## 2018-12-29 MED ORDER — PIPERACILLIN-TAZOBACTAM IN DEX 2-0.25 GM/50ML IV SOLN: 2.2500 g | Freq: Three times a day (TID) | INTRAVENOUS | Status: DC

## 2018-12-29 MED ORDER — PIPERACILLIN-TAZOBACTAM IN DEX 2-0.25 GM/50ML IV SOLN
2.2500 g | Freq: Three times a day (TID) | INTRAVENOUS | Status: DC
  Filled 2018-12-29 (×3): qty 50

## 2018-12-29 MED ORDER — FENTANYL CITRATE (PF) 100 MCG/2ML IJ SOLN
50.0000 ug | Freq: Once | INTRAMUSCULAR | Status: AC
  Administered 2018-12-29: 50 ug via INTRAVENOUS
  Filled 2018-12-29: qty 2

## 2018-12-29 MED ORDER — AMLODIPINE BESYLATE 5 MG PO TABS
5.0000 mg | ORAL_TABLET | Freq: Every evening | ORAL | Status: DC
  Administered 2018-12-30 – 2019-01-04 (×7): 5 mg via ORAL
  Filled 2018-12-29 (×7): qty 1

## 2018-12-29 MED ORDER — ACETAMINOPHEN 500 MG PO TABS: 500.0000 mg | ORAL_TABLET | Freq: Four times a day (QID) | ORAL | Status: DC | PRN

## 2018-12-29 MED ORDER — CALCIUM GLUCONATE 10 % IV SOLN
1.0000 g | Freq: Once | INTRAVENOUS | Status: AC
  Administered 2018-12-29: 1 g via INTRAVENOUS
  Filled 2018-12-29: qty 10

## 2018-12-29 MED ORDER — HYDROMORPHONE HCL 1 MG/ML IJ SOLN
1.0000 mg | Freq: Once | INTRAMUSCULAR | Status: AC
  Administered 2018-12-29: 18:00:00 1 mg via INTRAVENOUS
  Filled 2018-12-29: qty 1

## 2018-12-29 MED ORDER — SEVELAMER CARBONATE 800 MG PO TABS: 1600.0000 mg | ORAL_TABLET | Freq: Three times a day (TID) | ORAL | Status: DC

## 2018-12-29 MED ORDER — HYDROMORPHONE HCL 1 MG/ML IJ SOLN
1.0000 mg | Freq: Once | INTRAMUSCULAR | Status: AC
  Administered 2018-12-29: 1 mg via INTRAVENOUS
  Filled 2018-12-29: qty 1

## 2018-12-29 MED ORDER — ASPIRIN EC 81 MG PO TBEC
81.0000 mg | DELAYED_RELEASE_TABLET | Freq: Every day | ORAL | Status: DC
  Administered 2018-12-30 – 2019-01-04 (×7): 81 mg via ORAL
  Filled 2018-12-29 (×6): qty 1

## 2018-12-29 MED ORDER — DOXERCALCIFEROL 4 MCG/2ML IV SOLN
4.0000 ug | INTRAVENOUS | Status: DC
  Administered 2019-01-01: 4 ug via INTRAVENOUS
  Filled 2018-12-29 (×3): qty 2

## 2018-12-29 MED ORDER — DEXTROSE 50 % IV SOLN
1.0000 | Freq: Once | INTRAVENOUS | Status: AC
  Administered 2018-12-29: 17:00:00 50 mL via INTRAVENOUS
  Filled 2018-12-29: qty 50

## 2018-12-29 NOTE — Consult Note (Addendum)
[facility] Renal Consultation Note    Indication for Consultation:  Management of ESRD/hemodialysis; anemia, hypertension/volume and secondary hyperparathyroidism  WUX:LKGMWN, [facility]  HPI: Matthew Kline is a 58 y.o. male. ESRD 2/2 DM on HD MWF at [facility], first starting on 12/2012.  Past medical history significant for DM, HTN, Hx lacunar stroke, CHFm CAD s/p CABGx3, PAD w/multiple toe amputations and s/p L AKA, chronic foot ulcers and PMR with chronic pain.   Patient presented to the ED due to worsening foot pain.  Reports chronic ulcer on R foot, which had been healing and began to worsen about 3 days ago.  Reports mild drainage and erythema, progressively worsening pain over the last 3 days.  Pain radiates into his calf. States he missed dialysis today and Wed due to the pain.  Last dialysis on Monday.  Denies CP, palpitations, weakness, dizziness, n/v/d, abdominal pain, fever, chills and fatigue.  Pertinent findings in the ED include K 7.0, SCr 9.94, BUN 53, lactic acid 1.9, and WBC 8.9.  Patient is being admitted for further evaluation and management.   Of note patient is not compliant with prescribed dialysis regimen, often missing treatments for various reasons. He has been meeting his dry weight.   Past Medical History:  Diagnosis Date  . Anemia, unspecified   . Anxiety   . Arthralgia 2010   polyarticular  . Arthritis    "back, knees" (01/10/2017)  . Cancer [facility])    "kidney area" (01/10/2017)  . CHF (congestive heart failure) ([place]) 07/25/2009   denies  . Chronic lower back pain   . Coronary artery disease   . Coughing    pt. reports that he has drainage from sinus infection  . Diabetic foot ulcer ([place])   . Diabetic neuropathy ([place])   . ESRD (end stage renal disease) on dialysis [facility])    started 12/2012; "MWF; [place] "  (01/10/2017)  . GERD (gastroesophageal reflux disease)    hx "before I lost weight", no problem 9 years  . Hemodialysis  access site with mature fistula ([place])   . Hemorrhoids, internal 10/2011   small  . High cholesterol   . History of blood transfusion    "related to the anemia"  . Hypertension   . Insomnia, unspecified   . Lacunar infarction ([place]) 2006   RUE/RLE, speech  . Long term (current) use of anticoagulants   . Myocardial infarction ([place]) 1995  . Orthostatic hypotension   . Osteomyelitis of foot, left, acute ([place])   . Other chronic postoperative pain   . Pneumonia    "probably twice" (01/10/2017)  . Polymyalgia rheumatica ([place])   . Renal insufficiency   . Sleep apnea    "lost weight; no more problem" (01/10/2017)  . Stroke ([place]) 01/10/06   denies residual on 05/09/2014  . Type II diabetes mellitus ([place]) dx'd 1995  . Unspecified hereditary and idiopathic peripheral neuropathy    feet  . Unspecified osteomyelitis, site unspecified   . Unspecified vitamin D deficiency    Past Surgical History:  Procedure Laterality Date  . ABDOMINAL AORTOGRAM N/A 08/25/2016   Procedure: ABDOMINAL AORTOGRAM;  Surgeon: Wellington Hampshire, MD;  Location: [place] [facility];  Service: Cardiovascular;  Laterality: N/A;  . AMPUTATION  01/21/2012   Procedure: AMPUTATION RAY;  Surgeon: [name], MD;  Location: [place];  Service: Orthopedics;  Laterality: Left;  Left Foot 4th Ray Amputation  . AMPUTATION Left 05/04/2013   Procedure: AMPUTATION DIGIT;  Surgeon:  [name], MD;  Location: [place];  Service: Orthopedics;  Laterality: Left;  Left Great Toe Amputation at [facility]  . AMPUTATION Left 01/14/2017   Procedure: AMPUTATION ABOVE LEFT KNEE;  Surgeon: [name], MD;  Location: [place];  Service: Orthopedics;  Laterality: Left;  . ANTERIOR CERVICAL DECOMP/DISCECTOMY FUSION  02/2011  . BACK SURGERY    . BASCILIC VEIN TRANSPOSITION Left 10/19/2012   Procedure: BASCILIC VEIN TRANSPOSITION;  Surgeon: Serafina Mitchell, MD;  Location: [place];  Service: Vascular;  Laterality: Left;  . CARDIAC CATHETERIZATION     "before  bypass"  . CHOLECYSTECTOMY N/A 08/02/2018   Procedure: LAPAROSCOPIC CHOLECYSTECTOMY WITH INTRAOPERATIVE CHOLANGIOGRAM;  Surgeon: Donnie Mesa, MD;  Location: [place];  Service: General;  Laterality: N/A;  . CORONARY ARTERY BYPASS GRAFT     x 5 with lima at [place] SPACERS Left 08/07/2014   Procedure: Replace Left Total Knee Arthroplasty,  Place Antibiotic Spacer;  Surgeon: [name], MD;  Location: [place];  Service: Orthopedics;  Laterality: Left;  . I & D EXTREMITY Left 05/09/2014   Procedure: Irrigation and Debridement Left Knee and Closure of Total Knee Arthroplasty Incision;  Surgeon: [name], MD;  Location: [place];  Service: Orthopedics;  Laterality: Left;  . I & D KNEE WITH POLY EXCHANGE Left 05/31/2014   Procedure: IRRIGATION AND DEBRIDEMENT LEFT KNEE, PLACE ANTIBIOTIC BEADS,  POLY EXCHANGE;  Surgeon: [name], MD;  Location: [place];  Service: Orthopedics;  Laterality: Left;  . INGUINAL HERNIA REPAIR Bilateral 11/21/2018   Procedure: BILATERAL INGUINAL HERNIA REPAIR WITH MESH;  Surgeon: Coralie Keens, MD;  Location: [place];  Service: General;  Laterality: Bilateral;  . IRRIGATION AND DEBRIDEMENT KNEE Left 01/12/2017   Procedure: IRRIGATION AND DEBRIDEMENT LEFT KNEE;  Surgeon: [name], MD;  Location: [place];  Service: Orthopedics;  Laterality: Left;  . JOINT REPLACEMENT    . KNEE ARTHROSCOPY Left 08-25-2012  . LOWER EXTREMITY ANGIOGRAPHY Left 08/25/2016   Procedure: Lower Extremity Angiography;  Surgeon: Wellington Hampshire, MD;  Location: [place] [facility];  Service: Cardiovascular;  Laterality: Left;  . PERIPHERAL VASCULAR BALLOON ANGIOPLASTY Left 08/25/2016   Procedure: PERIPHERAL VASCULAR BALLOON ANGIOPLASTY;  Surgeon: Wellington Hampshire, MD;  Location: [place] [facility];  Service: Cardiovascular;  Laterality: Left;  lt peroneal and ant tibial arteries cutting balloon  . REFRACTIVE SURGERY Bilateral   . TOE  AMPUTATION Bilateral    "I've lost 7 toes over the last 7 years" (05/09/2014)  . TOE SURGERY Left April 2015   Big toe removed on left foot.  . TONSILLECTOMY    . TOTAL KNEE ARTHROPLASTY Left 04/10/2014   Procedure: TOTAL KNEE ARTHROPLASTY;  Surgeon: [name], MD;  Location: [place];  Service: Orthopedics;  Laterality: Left;  . TOTAL KNEE REVISION Left 10/25/2014   Procedure: LEFT TOTAL KNEE REVISION;  Surgeon: [name], MD;  Location: [place];  Service: Orthopedics;  Laterality: Left;  . TOTAL KNEE REVISION Left 11/26/2015   Procedure: Removal Left Total Knee Arthroplasty, Hinged Total Knee Arthroplasty;  Surgeon: [name], MD;  Location: [place];  Service: Orthopedics;  Laterality: Left;  . UVULOPALATOPHARYNGOPLASTY, TONSILLECTOMY AND SEPTOPLASTY  ~ 1989  . WOUND DEBRIDEMENT Left 05/09/2014   Dehiscence Left Total Knee Arthroplasty Incision   Family History  Problem Relation Age of Onset  . Hypertension Mother   . Cancer Mother 78  Ovarian  . Heart disease Maternal Aunt   . Stroke Maternal Grandfather    Social History:  reports that he has been smoking cigarettes. He has a 3.84 pack-year smoking history. He has never used smokeless tobacco. He reports that he does not drink alcohol or use drugs. Allergies  Allergen Reactions  . Morphine And Related Other (See Comments)    hallucinations  . Tygacil [Tigecycline] Nausea And Vomiting and Other (See Comments)  . Imodium [Loperamide] Itching   Prior to Admission medications   Medication Sig Start Date End Date Taking? Authorizing Provider  acetaminophen (TYLENOL) 500 MG tablet Take 500-1,000 mg by mouth every 6 (six) hours as needed for mild pain or fever.    Yes [provider]  amLODipine (NORVASC) 5 MG tablet Take 5 mg by mouth every evening.    Yes [provider]  aspirin EC 81 MG EC tablet Take 2 tablets (162 mg total) by mouth daily. Patient taking differently: Take 81 mg by mouth at bedtime.   01/20/17  Yes Patrecia Pour, Christean Grief, MD  carvedilol (COREG) 12.5 MG tablet Take 1 tablet (12.5 mg total) by mouth 2 (two) times daily with a meal. 02/08/17  Yes Bonnell Public, MD  Darbepoetin Alfa (ARANESP) 150 MCG/0.3ML SOSY injection Inject 0.3 mLs (150 mcg total) into the vein every Friday with hemodialysis. 02/11/17  Yes Dana Allan I, MD  gabapentin (NEURONTIN) 100 MG capsule Take 200 mg by mouth daily.  09/27/18  Yes [provider]  heparin 1000 UNIT/ML injection Inject 4 mLs into the vein See admin instructions. Every hemodialysis treatment 08/14/18 08/13/19 Yes [provider]  hydrOXYzine (ATARAX/VISTARIL) 50 MG tablet Take 50 mg by mouth 3 (three) times daily as needed for itching.    Yes [provider]  montelukast (SINGULAIR) 10 MG tablet Take 10 mg by mouth at bedtime as needed. 08/14/18  Yes [provider]  multivitamin (RENA-VIT) TABS tablet Take 1 tablet by mouth at bedtime. 02/08/17  Yes Dana Allan I, MD  oxyCODONE (OXY IR/ROXICODONE) 5 MG immediate release tablet Take 1 tablet (5 mg total) by mouth every 6 (six) hours as needed for moderate pain, severe pain or breakthrough pain. 11/22/18  Yes Coralie Keens, MD  sevelamer carbonate (RENVELA) 800 MG tablet Take 1,600-3,200 mg by mouth 3 (three) times daily with meals. 2-4 tabs with each meal   Yes [provider]  temazepam (RESTORIL) 30 MG capsule Take 30 mg by mouth at bedtime.    Yes [provider]   Current Facility-Administered Medications  Medication Dose Route Frequency Provider Last Rate Last Admin  . calcium gluconate inj 10% (1 g) URGENT USE ONLY!  1 g Intravenous Once Caccavale, Sophia, PA-C      . [START ON 12/30/2018] Chlorhexidine Gluconate Cloth 2 % PADS 6 each  6 each Topical Q0600 Penninger, Lindsay, PA      . insulin aspart (novoLOG) injection 5 Units  5 Units Intravenous Once Caccavale, Sophia, PA-C       And  . dextrose 50 % solution 50 mL  1  ampule Intravenous Once Caccavale, Sophia, PA-C      . sodium zirconium cyclosilicate (LOKELMA) packet 10 g  10 g Oral Once Caccavale, Sophia, PA-C       Current Outpatient Medications  Medication Sig Dispense Refill  . acetaminophen (TYLENOL) 500 MG tablet Take 500-1,000 mg by mouth every 6 (six) hours as needed for mild pain or fever.     Marland Kitchen amLODipine (  NORVASC) 5 MG tablet Take 5 mg by mouth every evening.     Marland Kitchen aspirin EC 81 MG EC tablet Take 2 tablets (162 mg total) by mouth daily. (Patient taking differently: Take 81 mg by mouth at bedtime. )    . carvedilol (COREG) 12.5 MG tablet Take 1 tablet (12.5 mg total) by mouth 2 (two) times daily with a meal. 60 tablet 0  . Darbepoetin Alfa (ARANESP) 150 MCG/0.3ML SOSY injection Inject 0.3 mLs (150 mcg total) into the vein every Friday with hemodialysis. 1.68 mL 0  . gabapentin (NEURONTIN) 100 MG capsule Take 200 mg by mouth daily.     . heparin 1000 UNIT/ML injection Inject 4 mLs into the vein See admin instructions. Every hemodialysis treatment    . hydrOXYzine (ATARAX/VISTARIL) 50 MG tablet Take 50 mg by mouth 3 (three) times daily as needed for itching.     . montelukast (SINGULAIR) 10 MG tablet Take 10 mg by mouth at bedtime as needed.    . multivitamin (RENA-VIT) TABS tablet Take 1 tablet by mouth at bedtime. 30 tablet 0  . oxyCODONE (OXY IR/ROXICODONE) 5 MG immediate release tablet Take 1 tablet (5 mg total) by mouth every 6 (six) hours as needed for moderate pain, severe pain or breakthrough pain. 25 tablet 0  . sevelamer carbonate (RENVELA) 800 MG tablet Take 1,600-3,200 mg by mouth 3 (three) times daily with meals. 2-4 tabs with each meal    . temazepam (RESTORIL) 30 MG capsule Take 30 mg by mouth at bedtime.      Labs: Basic Metabolic Panel: Recent Labs  Lab 12/29/18 1244  NA 138  K 7.0*  CL 95*  CO2 24  GLUCOSE 125*  BUN 53*  CREATININE 9.94*  CALCIUM 9.6   Liver Function Tests: Recent Labs  Lab 12/29/18 1244  AST 84*   ALT 51*  ALKPHOS 143*  BILITOT 1.1  PROT 8.0  ALBUMIN 3.0*   CBC: Recent Labs  Lab 12/29/18 1244  WBC 8.9  NEUTROABS 7.0  HGB 10.3*  HCT 32.1*  MCV 93.9  PLT 191   Studies/Results: No results found.  ROS: All others negative except those listed in HPI.  Physical Exam: Vitals:   12/29/18 1220 12/29/18 1437  BP: 137/84 127/84  Pulse: 82 72  Resp: 16 18  Temp: 98.7 F (37.1 C) 98.5 F (36.9 C)  TempSrc: Oral Oral  SpO2: 98% 97%     General: NAD, chronically ill appearing male Head: NCAT sclera not icteric Neck: Supple. No lymphadenopathy. No JVD Lungs: CTA bilaterally. No wheeze, rales or rhonchi. Breathing is unlabored. Heart: RRR. No murmur, rubs or gallops.  Abdomen: soft, nontender, +BS, no guarding, no rebound tenderness Lower extremities:L AKA - no stump edema, R TMA 1in diameter ulcer with yellow drainage and surrounding erythema/edema Neuro: AAOx3. Moves all extremities spontaneously. Psych:  Responds to questions appropriately with a normal affect. Dialysis Access:LU AVF +b/t  Dialysis Orders:  MWF  - [facility]  4hrs, BFR 400, DFR 800,  EDW 65.5kg, 2K/ 2Ca  Access: LU AVF  Heparin 4000 unit bolus Mircera 50 mcg q2wks - last 12/2 Hectorol 77mcg IV qHD   Assessment/Plan: 1.  hyperkalemia - K 7.0. Plan for urgent dialysis tonight 2. Foot pain 2/2 worsening ulcer possible cellulitis - +purulent drainage, with surrounding edema/erythema. per primary 3.  ESRD -  On HD MWF.  Missed last 2 HD.  Plan for urgent HD tonight due to ^K using low K bath.  4.  Hypertension/volume  -  BP well controlled. Does not appear volume overloaded on exam. Net UF goal 2-3L as tolerated 5.  Anemia of CKD - Hgb 10.3. No indication for ESA at this time.  6.  Secondary Hyperparathyroidism -  Ca at goal. Phos elevated at OP. Continue VDRA and binders. 7.  Nutrition - Renal diet w/fluid restrictions.  8. Hx CAD 9. DM - per primary 10. Hx seizures 11. PAD s/p amputations  Jen Mow, PA-C [facility] Pager: [phone] 12/29/2018, 4:44 PM   I have seen and examined this patient and agree with plan and assessment in the above note with renal recommendations/intervention highlighted.  Pt with ulceration of right lower extremity with soft tissue swelling and surrounding erythema without warmth but drainage and pain.  Concerning for cellulitis vs osteo.  Will need further imaging and abx per primary svc.  Skipped 2 HD sessions this week and has h/o nonadherence to HD.  Now with hyperkalemia without bradycardia or peaked t waves.  Will plan for urgent HD today with low K bath.  Broadus John A Artie Takayama,MD 12/29/2018 6:30 PM

## 2018-12-29 NOTE — ED Triage Notes (Signed)
Pt arrives via [facility] from home, reports wound to R foot that appears infected with redness around it. Missed dialysis Wednesday and today. AKA on L side, wheelchair bound. 98.1, bp 130/80, hr72, rr 18, O2 98% on room air.

## 2018-12-29 NOTE — ED Provider Notes (Signed)
Received patient as a handoff from Chrystine Oiler, PA-C.   HPI as obtained by hand-off provider: [name] is a 58 y.o. male presenting for evaluation of R leg pain.  Patient states for the past several days he has been having persistent right leg pain.  Pain is severe, worse with palpation and weightbearing.  Due to his leg pain, he has not been able to go to dialysis because it hurts too much.  Pain is made calf and extends down to his foot.  It is constant.  He denies trauma or injury.  He denies fevers, chills, nausea, vomiting, weakness, dizziness, confusion.  He is esrd on dialysis, goes mon, wed, fri.  Additional history obtained from chart review.  Patient with a history of CHF, CAD, chronic foot ulcers, diabetic neuropathy, ESRD on dialysis, GERD, hypertension, previous stroke, PMR, diabetes.  Patient is ESRD on hemodialysis Monday, Wednesdays, and Fridays.  He has missed the last 2 scheduled hemodialysis visits. Caccavale PA consulted [facility] [place] with nephrology and they feel as though he would benefit from urgent HD and will need to be admitted.  Patient will be admitted for dialysis, in setting of his hyperkalemia greater than 7.0.  Patient has a serum creatinine of 9.94, BUN of 53, and lactic acid of 1.9. EKG with tented T waves. Awaiting results of DVT study.  If negative, will treat for cellulitis.  Plan as dictated by providers is for patient to be admitted to medicine.   DVT study was negative for DVT.  At this time, hemodialysis is ready for the patient.  On physical exam, patient has obvious diabetic foot ulcer with surrounding erythema and mild induration.  Patient reports this occurred acutely over a matter of 2 days.  Will put in an order for 1 g IV Ancef, but patient is currently in route to hemodialysis and that we will have to wait until after hemodialysis is completed.  Per handoff provider, would still prefer patient admitted for his significant pain and  cellulitis surrounding chronic foot ulcers, as well as continued monitoring re: electrolyte disturbances.   Consulted with Dr. Roosevelt Locks with [facility] hospitalist who evaluated patient and will tele-admit patient. He has already placed orders for further osteomyelitis work-up including a consult to podiatry and PT evaluation following his urgent HD.    Corena Herter, PA-C 12/29/18 2047    Hayden Rasmussen, MD 12/30/18 1046

## 2018-12-29 NOTE — H&P (Signed)
History and Physical    Matthew Kline MCN:[id number] DOB: 01-Oct-1960 DOA: 12/29/2018  PCP: [facility] [place]   Patient coming from: Home  I have personally briefly reviewed patient's old medical records in [place]  Chief Complaint: Leg pain and missed dialysis.  HPI: Matthew Kline is a 58 y.o. male with medical history significant of ESRD 2/2 DM on HD MWF at [facility], first starting on 12/2012.  Past medical history significant for DM, HTN, Hx lacunar stroke, CHFm CAD s/p CABGx3, PAD w/multiple toe amputations and s/p L AKA, chronic foot ulcers and PMR with chronic pain.   Patient presented to the ED due to worsening foot pain.  He has had chronic ulcer on R foot/ankle area, which had been stable and began to show rash and swelling about 3 days ago.  Reports mild drainage and erythema, progressively worsening pain over the last 3 days.  Pain radiates into his calf sharp like, becomes unbearable when bearing weight or walking, as a result he missed dialysis today and Wed due to the pain.  Last dialysis on Monday.  Denies CP, palpitations, weakness, dizziness, n/v/d, abdominal pain, fever, chills and fatigue.   ED Course: Pertinent findings in the ED include K 7.0, SCr 9.94, BUN 53, lactic acid 1.9, and WBC 8.9.  Patient is being admitted for further evaluation and management.  EKG showed tented T wave, hyperkalemia cocktail including calcium gluconate insulin and Lokelma was given. DVT was ruled out with initial verbal report, and pt is to receive emergency HD this hour.  Review of Systems: As per HPI otherwise 10 point review of systems negative.    Past Medical History:  Diagnosis Date   Anemia, unspecified    Anxiety    Arthralgia 2010   polyarticular   Arthritis    "back, knees" (01/10/2017)   Cancer ([place])    "kidney area" (01/10/2017)   CHF (congestive heart failure) ([place]) 07/25/2009   denies   Chronic lower back pain    Coronary artery disease      Coughing    pt. reports that he has drainage from sinus infection   Diabetic foot ulcer ([place])    Diabetic neuropathy ([place])    ESRD (end stage renal disease) on dialysis ([place])    started 12/2012; "MWF; [facility] "  (01/10/2017)   GERD (gastroesophageal reflux disease)    hx "before I lost weight", no problem 9 years   Hemodialysis access site with mature fistula ([place])    Hemorrhoids, internal 10/2011   small   High cholesterol    History of blood transfusion    "related to the anemia"   Hypertension    Insomnia, unspecified    Lacunar infarction ([place]) 2006   RUE/RLE, speech   Long term (current) use of anticoagulants    Myocardial infarction ([place]) 1995   Orthostatic hypotension    Osteomyelitis of foot, left, acute ([place])    Other chronic postoperative pain    Pneumonia    "probably twice" (01/10/2017)   Polymyalgia rheumatica ([place])    Renal insufficiency    Sleep apnea    "lost weight; no more problem" (01/10/2017)   Stroke ([place]) 01/10/06   denies residual on 05/09/2014   Type II diabetes mellitus ([place]) dx'd 1995   Unspecified hereditary and idiopathic peripheral neuropathy    feet   Unspecified osteomyelitis, site unspecified    Unspecified vitamin D deficiency     Past Surgical History:  Procedure Laterality Date  ABDOMINAL AORTOGRAM N/A 08/25/2016   Procedure: ABDOMINAL AORTOGRAM;  Surgeon: Wellington Hampshire, MD;  Location: [place] [facility];  Service: Cardiovascular;  Laterality: N/A;   AMPUTATION  01/21/2012   Procedure: AMPUTATION RAY;  Surgeon: [name], MD;  Location: [place];  Service: Orthopedics;  Laterality: Left;  Left Foot 4th Ray Amputation   AMPUTATION Left 05/04/2013   Procedure: AMPUTATION DIGIT;  Surgeon: [name], MD;  Location: [place];  Service: Orthopedics;  Laterality: Left;  Left Great Toe Amputation at [facility]   AMPUTATION Left 01/14/2017   Procedure: AMPUTATION ABOVE LEFT KNEE;  Surgeon: [name], MD;   Location: [place];  Service: Orthopedics;  Laterality: Left;   ANTERIOR CERVICAL DECOMP/DISCECTOMY FUSION  02/2011   BACK SURGERY     BASCILIC VEIN TRANSPOSITION Left 10/19/2012   Procedure: BASCILIC VEIN TRANSPOSITION;  Surgeon: Serafina Mitchell, MD;  Location: [place];  Service: Vascular;  Laterality: Left;   CARDIAC CATHETERIZATION     "before bypass"   CHOLECYSTECTOMY N/A 08/02/2018   Procedure: LAPAROSCOPIC CHOLECYSTECTOMY WITH INTRAOPERATIVE CHOLANGIOGRAM;  Surgeon: Donnie Mesa, MD;  Location: [place];  Service: General;  Laterality: N/A;   CORONARY ARTERY BYPASS GRAFT     x 5 with lima at [place] Left 08/07/2014   Procedure: Replace Left Total Knee Arthroplasty,  Place Antibiotic Spacer;  Surgeon: [name], MD;  Location: [place];  Service: Orthopedics;  Laterality: Left;   I & D EXTREMITY Left 05/09/2014   Procedure: Irrigation and Debridement Left Knee and Closure of Total Knee Arthroplasty Incision;  Surgeon: [name], MD;  Location: [place];  Service: Orthopedics;  Laterality: Left;   I & D KNEE WITH POLY EXCHANGE Left 05/31/2014   Procedure: IRRIGATION AND DEBRIDEMENT LEFT KNEE, PLACE ANTIBIOTIC BEADS,  POLY EXCHANGE;  Surgeon: [name], MD;  Location: [place];  Service: Orthopedics;  Laterality: Left;   INGUINAL HERNIA REPAIR Bilateral 11/21/2018   Procedure: BILATERAL INGUINAL HERNIA REPAIR WITH MESH;  Surgeon: Coralie Keens, MD;  Location: [place];  Service: General;  Laterality: Bilateral;   IRRIGATION AND DEBRIDEMENT KNEE Left 01/12/2017   Procedure: IRRIGATION AND DEBRIDEMENT LEFT KNEE;  Surgeon: [name], MD;  Location: [place];  Service: Orthopedics;  Laterality: Left;   JOINT REPLACEMENT     KNEE ARTHROSCOPY Left 08-25-2012   LOWER EXTREMITY ANGIOGRAPHY Left 08/25/2016   Procedure: Lower Extremity Angiography;  Surgeon: Wellington Hampshire, MD;  Location: [place] [facility];  Service:  Cardiovascular;  Laterality: Left;   PERIPHERAL VASCULAR BALLOON ANGIOPLASTY Left 08/25/2016   Procedure: PERIPHERAL VASCULAR BALLOON ANGIOPLASTY;  Surgeon: Wellington Hampshire, MD;  Location: [facility];  Service: Cardiovascular;  Laterality: Left;  lt peroneal and ant tibial arteries cutting balloon   REFRACTIVE SURGERY Bilateral    TOE AMPUTATION Bilateral    "I've lost 7 toes over the last 7 years" (05/09/2014)   TOE SURGERY Left April 2015   Big toe removed on left foot.   TONSILLECTOMY     TOTAL KNEE ARTHROPLASTY Left 04/10/2014   Procedure: TOTAL KNEE ARTHROPLASTY;  Surgeon: [name], MD;  Location: [place];  Service: Orthopedics;  Laterality: Left;   TOTAL KNEE REVISION Left 10/25/2014   Procedure: LEFT TOTAL KNEE REVISION;  Surgeon: [name], MD;  Location: [place];  Service: Orthopedics;  Laterality: Left;   TOTAL KNEE REVISION Left 11/26/2015   Procedure:  Removal Left Total Knee Arthroplasty, Hinged Total Knee Arthroplasty;  Surgeon: [name], MD;  Location: [place];  Service: Orthopedics;  Laterality: Left;   UVULOPALATOPHARYNGOPLASTY, TONSILLECTOMY AND SEPTOPLASTY  ~ [place] Left 05/09/2014   Dehiscence Left Total Knee Arthroplasty Incision     reports that he has been smoking cigarettes. He has a 3.84 pack-year smoking history. He has never used smokeless tobacco. He reports that he does not drink alcohol or use drugs.  Allergies  Allergen Reactions   Morphine And Related Other (See Comments)    hallucinations   Tygacil [Tigecycline] Nausea And Vomiting and Other (See Comments)   Imodium [Loperamide] Itching    Family History  Problem Relation Age of Onset   Hypertension Mother    Cancer Mother 38       Ovarian   Heart disease Maternal Aunt    Stroke Maternal Grandfather     Prior to Admission medications   Medication Sig Start Date End Date Taking? Authorizing Provider  acetaminophen (TYLENOL) 500 MG tablet Take  500-1,000 mg by mouth every 6 (six) hours as needed for mild pain or fever.    Yes [provider]  amLODipine (NORVASC) 5 MG tablet Take 5 mg by mouth every evening.    Yes [provider]  aspirin EC 81 MG EC tablet Take 2 tablets (162 mg total) by mouth daily. Patient taking differently: Take 81 mg by mouth at bedtime.  01/20/17  Yes Patrecia Pour, Christean Grief, MD  carvedilol (COREG) 12.5 MG tablet Take 1 tablet (12.5 mg total) by mouth 2 (two) times daily with a meal. 02/08/17  Yes Bonnell Public, MD  Darbepoetin Alfa (ARANESP) 150 MCG/0.3ML SOSY injection Inject 0.3 mLs (150 mcg total) into the vein every Friday with hemodialysis. 02/11/17  Yes Dana Allan I, MD  gabapentin (NEURONTIN) 100 MG capsule Take 200 mg by mouth daily.  09/27/18  Yes [provider]  heparin 1000 UNIT/ML injection Inject 4 mLs into the vein See admin instructions. Every hemodialysis treatment 08/14/18 08/13/19 Yes [provider]  hydrOXYzine (ATARAX/VISTARIL) 50 MG tablet Take 50 mg by mouth 3 (three) times daily as needed for itching.    Yes [provider]  montelukast (SINGULAIR) 10 MG tablet Take 10 mg by mouth at bedtime as needed. 08/14/18  Yes [provider]  multivitamin (RENA-VIT) TABS tablet Take 1 tablet by mouth at bedtime. 02/08/17  Yes Dana Allan I, MD  oxyCODONE (OXY IR/ROXICODONE) 5 MG immediate release tablet Take 1 tablet (5 mg total) by mouth every 6 (six) hours as needed for moderate pain, severe pain or breakthrough pain. 11/22/18  Yes Coralie Keens, MD  sevelamer carbonate (RENVELA) 800 MG tablet Take 1,600-3,200 mg by mouth 3 (three) times daily with meals. 2-4 tabs with each meal   Yes [provider]  temazepam (RESTORIL) 30 MG capsule Take 30 mg by mouth at bedtime.    Yes [provider]    Physical Exam: Vitals:   12/29/18 1220 12/29/18 1437  BP: 137/84 127/84  Pulse: 82 72  Resp: 16 18  Temp: 98.7 F (37.1 C)  98.5 F (36.9 C)  TempSrc: Oral Oral  SpO2: 98% 97%    Constitutional: NAD, calm, comfortable Vitals:   12/29/18 1220 12/29/18 1437  BP: 137/84 127/84  Pulse: 82 72  Resp: 16 18  Temp: 98.7 F (37.1 C) 98.5 F (36.9 C)  TempSrc: Oral Oral  SpO2: 98% 97%   Eyes: PERRL,  lids and conjunctivae normal ENMT: Mucous membranes are moist. Posterior pharynx clear of any exudate or lesions.Normal dentition.  Neck: normal, supple, no masses, no thyromegaly Respiratory: clear to auscultation bilaterally, no wheezing, no crackles. Normal respiratory effort. No accessory muscle use.  Cardiovascular: Regular rate and rhythm, no murmurs / rubs / gallops. No extremity edema. 2+ pedal pulses. No carotid bruits.  Abdomen: no tenderness, no masses palpated. No hepatosplenomegaly. Bowel sounds positive.  Musculoskeletal: no clubbing / cyanosis. No joint deformity upper and lower extremities. Good ROM, no contractures. Normal muscle tone.  Skin: Ulcer 3x5 on right ankle area, with thick pus covering bottom, and rash looks angry also with tenderness and warm to touch. Neurologic: CN 2-12 grossly intact. Sensation intact, DTR normal. Strength 5/5 in all 4.  Psychiatric: Normal judgment and insight. Alert and oriented x 3. Normal mood.    Labs on Admission: I have personally reviewed following labs and imaging studies  CBC: Recent Labs  Lab 12/29/18 1244  WBC 8.9  NEUTROABS 7.0  HGB 10.3*  HCT 32.1*  MCV 93.9  PLT 478   Basic Metabolic Panel: Recent Labs  Lab 12/29/18 1244  NA 138  K 7.0*  CL 95*  CO2 24  GLUCOSE 125*  BUN 53*  CREATININE 9.94*  CALCIUM 9.6   GFR: CrCl cannot be calculated (Unknown ideal weight.). Liver Function Tests: Recent Labs  Lab 12/29/18 1244  AST 84*  ALT 51*  ALKPHOS 143*  BILITOT 1.1  PROT 8.0  ALBUMIN 3.0*   No results for input(s): LIPASE, AMYLASE in the last 168 hours. No results for input(s): AMMONIA in the last 168 hours. Coagulation  Profile: No results for input(s): INR, PROTIME in the last 168 hours. Cardiac Enzymes: No results for input(s): CKTOTAL, CKMB, CKMBINDEX, TROPONINI in the last 168 hours. BNP (last 3 results) No results for input(s): PROBNP in the last 8760 hours. HbA1C: No results for input(s): HGBA1C in the last 72 hours. CBG: No results for input(s): GLUCAP in the last 168 hours. Lipid Profile: No results for input(s): CHOL, HDL, LDLCALC, TRIG, CHOLHDL, LDLDIRECT in the last 72 hours. Thyroid Function Tests: No results for input(s): TSH, T4TOTAL, FREET4, T3FREE, THYROIDAB in the last 72 hours. Anemia Panel: No results for input(s): VITAMINB12, FOLATE, FERRITIN, TIBC, IRON, RETICCTPCT in the last 72 hours. Urine analysis:    Component Value Date/Time   COLORURINE YELLOW 01/29/2017 1141   APPEARANCEUR CLOUDY (A) 01/29/2017 1141   APPEARANCEUR Clear 07/12/2013 0918   LABSPEC 1.011 01/29/2017 1141   PHURINE 8.0 01/29/2017 1141   GLUCOSEU 50 (A) 01/29/2017 1141   HGBUR SMALL (A) 01/29/2017 1141   BILIRUBINUR NEGATIVE 01/29/2017 1141   BILIRUBINUR Negative 07/12/2013 0918   KETONESUR NEGATIVE 01/29/2017 1141   PROTEINUR 100 (A) 01/29/2017 1141   UROBILINOGEN 0.2 01/03/2012 1335   NITRITE NEGATIVE 01/29/2017 1141   LEUKOCYTESUR LARGE (A) 01/29/2017 1141   LEUKOCYTESUR Negative 07/12/2013 0918    Radiological Exams on Admission: DG Chest Portable 1 View  Result Date: 12/29/2018 CLINICAL DATA:  Crackles, missed dialysis. EXAM: PORTABLE CHEST 1 VIEW COMPARISON:  11/24/2018 FINDINGS: Cardiomediastinal contours with cardiac enlargement accentuated by low volume expansion and portable technique. Increased interstitial markings present bilaterally. Signs of linear opacities at the left lung base. No acute bone finding. IMPRESSION: Signs of mild interstitial edema and basilar atelectasis. Electronically Signed   By: Zetta Bills M.D.   On: 12/29/2018 18:36   VAS [place] LOWER EXTREMITY VENOUS (DVT) (ONLY  [facility] & [facility] 7a-7p)  Result Date: 12/29/2018  Lower Venous Study Indications: Pain.  Limitations: Poor ultrasound/tissue interface and restricted mobility. Performing Technologist: Maudry Mayhew MHA, RDMS, RVT, RDCS  Examination Guidelines: A complete evaluation includes B-mode imaging, spectral Doppler, color Doppler, and power Doppler as needed of all accessible portions of each vessel. Bilateral testing is considered an integral part of a complete examination. Limited examinations for reoccurring indications may be performed as noted.  +---------+---------------+---------+-----------+----------+--------------+  RIGHT     Compressibility Phasicity Spontaneity Properties Thrombus Aging  +---------+---------------+---------+-----------+----------+--------------+  FV Prox   Full                                                             +---------+---------------+---------+-----------+----------+--------------+  FV Mid    Full                                                             +---------+---------------+---------+-----------+----------+--------------+  FV Distal Full                                                             +---------+---------------+---------+-----------+----------+--------------+  POP       Full            No        Yes                                    +---------+---------------+---------+-----------+----------+--------------+  PTV       Full                                                             +---------+---------------+---------+-----------+----------+--------------+  PERO      Full                                                             +---------+---------------+---------+-----------+----------+--------------+ Unable to visualize right common femoral vein and saphenofemoral junction.   Summary: Right: There is no evidence of deep vein thrombosis in the lower extremity. However, portions of this examination were limited- see technologist comments above. No cystic  structure found in the popliteal fossa. Lower extremity venous flow is pulsatile, suggestive of possible elevated right heart pressure.  *See table(s) above for measurements and observations.    Preliminary     EKG: Independently reviewed. Tented T wave.  Assessment/Plan Active Problems:   Cellulitis and abscess of toe of right foot  Infected ulcer of the right foot, even the patient has a history of  diabetes and CKD, suspect Pseudomonas infection, his MRSA screen in the last 2 months has remained negative.  Will switch patient to Zosyn coverage but not v.  MRI ordered to rule out osteomyelitis, and or any muscle or tendon involvement, even that the patient has referral pain to the calf.  Consult podiatry (however cannot localize podiatrist contact info in Epic), likely he will need a I&D. PT also ordered to evaluate ambulation.  Hyperkalemia, from missed HD, had feature of tented T waves, received High K cocktail and is to receive HD.  Hx of DM, most recent A1C 5.7, no more DM meds indicated.  ESRD, as above.  CAD, no acute issue. ASA and statin  PVD, may need vascular involved in pt's care, will defer to podiatry.  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  HTN, controlled, continue home BP meds.      DVT prophylaxis: Heparin SubQ Code Status: Full Family Communication: None at bedside Disposition Plan: PT evaluation Consults called: Podiatry (ED staff said only routine consult available) Admission status: Tele admit (till HyperK resolves)   Lequita Halt MD [facility] Pager [phone]  If 7PM-7AM, please contact night-coverage [email or web address] Password [facility]  12/29/2018, 7:35 PM

## 2018-12-29 NOTE — Progress Notes (Signed)
Pharmacy Antibiotic Note  Matthew Kline is a 58 y.o. male admitted on 12/29/2018 with wound infection.  Pharmacy has been consulted for Zosyn dosing. ESRD on HD MWF. Per MD, RN holding antibiotic until after HD completed - scheduled 1st dose for 2200 tonight.  Plan: Zosyn 3.375g IV (10min infusion) x 1; then 2.25g IV q8h Monitor clinical progress, c/s, abx plan/LOT Monitor HD schedule/tolerance inpatient     Temp (24hrs), Avg:98.6 F (37 C), Min:98.5 F (36.9 C), Max:98.7 F (37.1 C)  Recent Labs  Lab 12/29/18 1244  WBC 8.9  CREATININE 9.94*  LATICACIDVEN 1.9    CrCl cannot be calculated (Unknown ideal weight.).    Allergies  Allergen Reactions  . Morphine And Related Other (See Comments)    hallucinations  . Tygacil [Tigecycline] Nausea And Vomiting and Other (See Comments)  . Imodium [Loperamide] Itching    Elicia Lamp, PharmD, BCPS Please check AMION for all [place] contact numbers Clinical Pharmacist 12/29/2018 7:45 PM

## 2018-12-29 NOTE — ED Notes (Signed)
Hold antibiotic until post HD per Dr. Melina Copa

## 2018-12-29 NOTE — ED Provider Notes (Signed)
[facility] EMERGENCY DEPARTMENT Provider Note   CSN: [id number] Arrival date & time: 12/29/18  1214     History Chief Complaint  Patient presents with  . Wound Infection    Matthew Kline is a 58 y.o. male presenting for evaluation of R leg pain.   Patient states for the past several days he has been having persistent right leg pain.  Pain is severe, worse with palpation and weightbearing.  Due to his leg pain, he has not been able to go to dialysis because it hurts too much.  Pain is made calf and extends down to his foot.  It is constant.  He denies trauma or injury.  He denies fevers, chills, nausea, vomiting, weakness, dizziness, confusion.  He is esrd on dialysis, goes mon, wed, fri.   Additional history obtained from chart review.  Patient with a history of CHF, CAD, chronic foot ulcers, diabetic neuropathy, ESRD on dialysis, GERD, hypertension, previous stroke, PMR, diabetes.  HPI     Past Medical History:  Diagnosis Date  . Anemia, unspecified   . Anxiety   . Arthralgia 2010   polyarticular  . Arthritis    "back, knees" (01/10/2017)  . Cancer [facility])    "kidney area" (01/10/2017)  . CHF (congestive heart failure) ([place]) 07/25/2009   denies  . Chronic lower back pain   . Coronary artery disease   . Coughing    pt. reports that he has drainage from sinus infection  . Diabetic foot ulcer ([place])   . Diabetic neuropathy ([place])   . ESRD (end stage renal disease) on dialysis [facility])    started 12/2012; "MWF; [place] [facility] "  (01/10/2017)  . GERD (gastroesophageal reflux disease)    hx "before I lost weight", no problem 9 years  . Hemodialysis access site with mature fistula ([place])   . Hemorrhoids, internal 10/2011   small  . High cholesterol   . History of blood transfusion    "related to the anemia"  . Hypertension   . Insomnia, unspecified   . Lacunar infarction ([place]) 2006   RUE/RLE, speech  . Long term (current) use of anticoagulants   .  Myocardial infarction ([place]) 1995  . Orthostatic hypotension   . Osteomyelitis of foot, left, acute ([place])   . Other chronic postoperative pain   . Pneumonia    "probably twice" (01/10/2017)  . Polymyalgia rheumatica ([place])   . Renal insufficiency   . Sleep apnea    "lost weight; no more problem" (01/10/2017)  . Stroke ([place]) 01/10/06   denies residual on 05/09/2014  . Type II diabetes mellitus () dx'd 1995  . Unspecified hereditary and idiopathic peripheral neuropathy    feet  . Unspecified osteomyelitis, site unspecified   . Unspecified vitamin D deficiency     Patient Active Problem List   Diagnosis Date Noted  . Cellulitis of scrotum 11/27/2018  . Sepsis ([place]) 11/24/2018  . Thrombocytopenia ([place]) 11/24/2018  . Bilateral inguinal hernia 11/21/2018  . Biliary colic 61/44/3154  . Acute cholecystitis 07/31/2018  . Prolonged QT interval 07/31/2018  . Acute respiratory failure with hypoxia ([place]) 07/10/2018  . Abdominal pain 07/10/2018  . Bilateral recurrent inguinal hernia without obstruction or gangrene   . Altered mental status   . Cerebral thrombosis with cerebral infarction 02/06/2017  . Cerebral embolism with cerebral infarction 02/06/2017  . Hx of AKA (above knee amputation), left ([place])   . Pressure injury of skin 01/30/2017  . Acute lower UTI 01/29/2017  .  Acute metabolic encephalopathy 87/56/4332  . UTI (urinary tract infection) 01/29/2017  . Quadriceps muscle rupture, left, initial encounter   . Fall 01/09/2017  . Gait disturbance 01/09/2017  . Staphylococcus aureus infection 01/09/2017  . Infection of prosthetic left knee joint ([place]) 01/09/2017  . Fever, unknown origin 11/09/2016  . Critical lower limb ischemia 08/25/2016  . Ulcer of left midfoot with fat layer exposed ([place]) 08/13/2016  . Diabetic ulcer of left midfoot associated with type 2 diabetes mellitus, with fat layer exposed ([place]) 07/25/2016  . Peripheral neuropathy 07/22/2016  . Tobacco abuse 07/22/2016   . CAD in native artery 05/18/2016  . CAD, multiple vessel 05/11/2016  . Positive cardiac stress test 05/11/2016  . Abnormal stress test 04/30/2016  . Pre-transplant evaluation for kidney transplant 04/30/2016  . S/P revision of total knee 11/26/2015  . Pain in the chest   . Acute on chronic diastolic heart failure ([place]) 07/05/2015  . Volume overload 07/04/2015  . Shortness of breath 07/04/2015  . Hypoxemia 07/04/2015  . Elevated troponin   . End-stage renal disease on hemodialysis ([place])   . Hypervolemia   . 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 Pyogenic bacterial arthritis of knee, left ([place]) 08/07/2014  . Tachycardia 07/24/2014  . Acute upper respiratory infection 07/24/2014  . ESRD on dialysis ([place]) 07/14/2014  . Type II diabetes mellitus ([place]) 07/14/2014  . Anemia in chronic kidney disease 07/14/2014  . Congestive heart failure (CHF) ([place]) 07/13/2014  . Surgical wound dehiscence 05/09/2014  . Dehiscence of closure of skin 05/09/2014  . Total knee replacement status 04/10/2014  . Diabetes mellitus with renal manifestations, controlled ([place]) 10/24/2013  . Hypertensive renal disease 06/27/2013  . DM type 2 causing vascular disease ([place]) 06/27/2013  . Erectile dysfunction 06/27/2013  . Depression 06/27/2013  . Claudication of left lower extremity ([place]) 12/19/2012  . Essential hypertension, benign 12/19/2012  . Sinusitis, acute maxillary 11/22/2012  . Otitis, externa, infective 11/14/2012  . Leg edema, left 11/14/2012  . End stage renal disease ([place]) 10/02/2012  . Controlled type 2 DM with proteinuria or microalbuminuria 09/19/2012  . GERD (gastroesophageal reflux disease) 09/19/2012  . Leukocytosis 09/19/2012  . Lacunar infarction ([place]) 08/17/2012  . Polymyalgia rheumatica ([place]) 08/17/2012  . 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 Essential hypertension 05/10/2012  . Vitamin D deficiency 05/10/2012  . Diabetes mellitus due to underlying condition ([place])  05/10/2012  . Hyperlipidemia LDL goal <100 05/10/2012  . Anemia of chronic disease 05/10/2012  . Screening for prostate cancer 05/10/2012  . Chronic kidney disease (CKD), stage IV (severe) ([place]) 05/10/2012  . Peripheral autonomic neuropathy due to DM ([place]) 05/10/2012  . Callus of foot 05/10/2012  . Urgency of urination 05/10/2012  . Hyperkalemia 05/10/2012  . Candidiasis of the esophagus 10/12/2011  . Internal hemorrhoids without mention of complication 95/18/8416  . Pre-syncope 07/25/2009  . DJD (degenerative joint disease) of cervical spine 02/17/2009    Past Surgical History:  Procedure Laterality Date  . ABDOMINAL AORTOGRAM N/A 08/25/2016   Procedure: ABDOMINAL AORTOGRAM;  Surgeon: Wellington Hampshire, MD;  Location: [facility] CV LAB;  Service: Cardiovascular;  Laterality: N/A;  . AMPUTATION  01/21/2012   Procedure: AMPUTATION RAY;  Surgeon: [name], MD;  Location: [place] [facility];  Service: Orthopedics;  Laterality: Left;  Left Foot 4th Ray Amputation  . AMPUTATION Left 05/04/2013   Procedure: AMPUTATION DIGIT;  Surgeon: [name], MD;  Location: [facility];  Service: Orthopedics;  Laterality: Left;  Left Great Toe  Amputation at [facility]  . AMPUTATION Left 01/14/2017   Procedure: AMPUTATION ABOVE LEFT KNEE;  Surgeon: [name], MD;  Location: [facility];  Service: Orthopedics;  Laterality: Left;  . ANTERIOR CERVICAL DECOMP/DISCECTOMY FUSION  02/2011  . BACK SURGERY    . BASCILIC VEIN TRANSPOSITION Left 10/19/2012   Procedure: BASCILIC VEIN TRANSPOSITION;  Surgeon: Serafina Mitchell, MD;  Location: [facility];  Service: Vascular;  Laterality: Left;  . CARDIAC CATHETERIZATION     "before bypass"  . CHOLECYSTECTOMY N/A 08/02/2018   Procedure: LAPAROSCOPIC CHOLECYSTECTOMY WITH INTRAOPERATIVE CHOLANGIOGRAM;  Surgeon: Donnie Mesa, MD;  Location: [facility];  Service: General;  Laterality: N/A;  . CORONARY ARTERY BYPASS GRAFT     x 5 with lima at [place] SPACERS Left 08/07/2014   Procedure: Replace Left Total Knee Arthroplasty,  Place Antibiotic Spacer;  Surgeon: [name], MD;  Location: [place];  Service: Orthopedics;  Laterality: Left;  . I & D EXTREMITY Left 05/09/2014   Procedure: Irrigation and Debridement Left Knee and Closure of Total Knee Arthroplasty Incision;  Surgeon: [name], MD;  Location: [facility];  Service: Orthopedics;  Laterality: Left;  . I & D KNEE WITH POLY EXCHANGE Left 05/31/2014   Procedure: IRRIGATION AND DEBRIDEMENT LEFT KNEE, PLACE ANTIBIOTIC BEADS,  POLY EXCHANGE;  Surgeon: [name], MD;  Location: [facility];  Service: Orthopedics;  Laterality: Left;  . INGUINAL HERNIA REPAIR Bilateral 11/21/2018   Procedure: BILATERAL INGUINAL HERNIA REPAIR WITH MESH;  Surgeon: Coralie Keens, MD;  Location: [facility];  Service: General;  Laterality: Bilateral;  . IRRIGATION AND DEBRIDEMENT KNEE Left 01/12/2017   Procedure: IRRIGATION AND DEBRIDEMENT LEFT KNEE;  Surgeon: [name], MD;  Location: [facility] [place];  Service: Orthopedics;  Laterality: Left;  . JOINT REPLACEMENT    . KNEE ARTHROSCOPY Left 08-25-2012  . LOWER EXTREMITY ANGIOGRAPHY Left 08/25/2016   Procedure: Lower Extremity Angiography;  Surgeon: Wellington Hampshire, MD;  Location: [facility];  Service: Cardiovascular;  Laterality: Left;  . PERIPHERAL VASCULAR BALLOON ANGIOPLASTY Left 08/25/2016   Procedure: PERIPHERAL VASCULAR BALLOON ANGIOPLASTY;  Surgeon: Wellington Hampshire, MD;  Location: [facility];  Service: Cardiovascular;  Laterality: Left;  lt peroneal and ant tibial arteries cutting balloon  . REFRACTIVE SURGERY Bilateral   . TOE AMPUTATION Bilateral    "I've lost 7 toes over the last 7 years" (05/09/2014)  . TOE SURGERY Left April 2015   Big toe removed on left foot.  . TONSILLECTOMY    . TOTAL KNEE ARTHROPLASTY Left 04/10/2014   Procedure: TOTAL KNEE ARTHROPLASTY;  Surgeon: [name], MD;  Location: [facility];  Service: Orthopedics;   Laterality: Left;  . TOTAL KNEE REVISION Left 10/25/2014   Procedure: LEFT TOTAL KNEE REVISION;  Surgeon: [name], MD;  Location: [facility] [place];  Service: Orthopedics;  Laterality: Left;  . TOTAL KNEE REVISION Left 11/26/2015   Procedure: Removal Left Total Knee Arthroplasty, Hinged Total Knee Arthroplasty;  Surgeon: [name], MD;  Location: [facility];  Service: Orthopedics;  Laterality: Left;  . UVULOPALATOPHARYNGOPLASTY, TONSILLECTOMY AND SEPTOPLASTY  ~ 1989  . WOUND DEBRIDEMENT Left 05/09/2014   Dehiscence Left Total Knee Arthroplasty Incision       Family History  Problem Relation Age of Onset  . Hypertension Mother   . Cancer Mother 31       Ovarian  . Heart disease Maternal Aunt   . Stroke  Maternal Grandfather     Social History   Tobacco Use  . Smoking status: Current Some Day Smoker    Packs/day: 0.12    Years: 32.00    Pack years: 3.84    Types: Cigarettes  . Smokeless tobacco: Never Used  . Tobacco comment: 1 per week  Substance Use Topics  . Alcohol use: No    Alcohol/week: 0.0 standard drinks  . Drug use: No    Home Medications Prior to Admission medications   Medication Sig Start Date End Date Taking? Authorizing Provider  acetaminophen (TYLENOL) 500 MG tablet Take 500-1,000 mg by mouth every 6 (six) hours as needed for mild pain or fever.    Yes [provider]  amLODipine (NORVASC) 5 MG tablet Take 5 mg by mouth every evening.    Yes [provider]  aspirin EC 81 MG EC tablet Take 2 tablets (162 mg total) by mouth daily. Patient taking differently: Take 81 mg by mouth at bedtime.  01/20/17  Yes Patrecia Pour, Christean Grief, MD  carvedilol (COREG) 12.5 MG tablet Take 1 tablet (12.5 mg total) by mouth 2 (two) times daily with a meal. 02/08/17  Yes Bonnell Public, MD  Darbepoetin Alfa (ARANESP) 150 MCG/0.3ML SOSY injection Inject 0.3 mLs (150 mcg total) into the vein every Friday with hemodialysis. 02/11/17  Yes Dana Allan I, MD  gabapentin  (NEURONTIN) 100 MG capsule Take 200 mg by mouth daily.  09/27/18  Yes [provider]  heparin 1000 UNIT/ML injection Inject 4 mLs into the vein See admin instructions. Every hemodialysis treatment 08/14/18 08/13/19 Yes [provider]  hydrOXYzine (ATARAX/VISTARIL) 50 MG tablet Take 50 mg by mouth 3 (three) times daily as needed for itching.    Yes [provider]  montelukast (SINGULAIR) 10 MG tablet Take 10 mg by mouth at bedtime as needed. 08/14/18  Yes [provider]  multivitamin (RENA-VIT) TABS tablet Take 1 tablet by mouth at bedtime. 02/08/17  Yes Dana Allan I, MD  oxyCODONE (OXY IR/ROXICODONE) 5 MG immediate release tablet Take 1 tablet (5 mg total) by mouth every 6 (six) hours as needed for moderate pain, severe pain or breakthrough pain. 11/22/18  Yes Coralie Keens, MD  sevelamer carbonate (RENVELA) 800 MG tablet Take 1,600-3,200 mg by mouth 3 (three) times daily with meals. 2-4 tabs with each meal   Yes [provider]  temazepam (RESTORIL) 30 MG capsule Take 30 mg by mouth at bedtime.    Yes [provider]    Allergies    Morphine and related, Tygacil [tigecycline], and Imodium [loperamide]  Review of Systems   Review of Systems  Musculoskeletal: Positive for myalgias.  Hematological: Does not bruise/bleed easily.  All other systems reviewed and are negative.   Physical Exam Updated Vital Signs BP 127/84 (BP Location: Right Arm)   Pulse 72   Temp 98.5 F (36.9 C) (Oral)   Resp 18   SpO2 97%   Physical Exam Vitals and nursing note reviewed.  Constitutional:      General: He is not in acute distress.    Comments: Chronically ill-appearing, nontoxic in appearance  HENT:     Head: Normocephalic and atraumatic.  Eyes:     Conjunctiva/sclera: Conjunctivae normal.     Pupils: Pupils are equal, round, and reactive to light.  Cardiovascular:     Rate and Rhythm: Normal rate and regular rhythm.  Pulmonary:      Effort: Pulmonary effort is normal. No respiratory distress.  Breath sounds: No wheezing.  Chest:     Comments: crackles in R lower lobe. spo2 stable on RA. No signs of respiratory distress.  Abdominal:     General: There is no distension.     Palpations: Abdomen is soft. There is no mass.     Tenderness: There is no abdominal tenderness. There is no guarding or rebound.  Musculoskeletal:     Cervical back: Normal range of motion and neck supple.       Legs:     Comments: Left AKA. Right partial foot amputation.  Pedal pulses intact.  Soft compartments of the lower extremity.  Tenderness palpation of the entire lower extremity, but mostly from mid calf and distal.  Skin:    Comments: Multiple chronic/scabbed over wounds.  Ulcer of the anterior right ankle with mild surrounding erythema.  No obvious induration.  No purulence.  No circumferential erythema or warmth.  Neurological:     Mental Status: He is alert and oriented to person, place, and time.     ED Results / Procedures / Treatments   Labs (all labs ordered are listed, but only abnormal results are displayed) Labs Reviewed  COMPREHENSIVE METABOLIC PANEL - Abnormal; Notable for the following components:      Result Value   Potassium 7.0 (*)    Chloride 95 (*)    Glucose, Bld 125 (*)    BUN 53 (*)    Creatinine, Ser 9.94 (*)    Albumin 3.0 (*)    AST 84 (*)    ALT 51 (*)    Alkaline Phosphatase 143 (*)    GFR calc non Af Amer 5 (*)    GFR calc Af Amer 6 (*)    Anion gap 19 (*)    All other components within normal limits  CBC WITH DIFFERENTIAL/PLATELET - Abnormal; Notable for the following components:   RBC 3.42 (*)    Hemoglobin 10.3 (*)    HCT 32.1 (*)    All other components within normal limits  RESPIRATORY PANEL BY RT PCR (FLU A&B, COVID)  LACTIC ACID, PLASMA  LACTIC ACID, PLASMA    EKG EKG Interpretation  Date/Time:  Friday December 29 2018 15:22:06 EST Ventricular Rate:  75 PR Interval:    QRS  Duration: 105 QT Interval:  435 QTC Calculation: 486 R Axis:   78 Text Interpretation: Sinus rhythm Prolonged PR interval Nonspecific T abnormalities, lateral leads Borderline prolonged QT interval since last tracing no significant change Confirmed by Daleen Bo [phone]) on 12/29/2018 3:26:05 PM   Radiology No results found.  Procedures .Critical Care Performed by: Franchot Heidelberg, PA-C Authorized by: Franchot Heidelberg, PA-C   Critical care provider statement:    Critical care time (minutes):  40   Critical care time was exclusive of:  Separately billable procedures and treating other patients and teaching time   Critical care was necessary to treat or prevent imminent or life-threatening deterioration of the following conditions:  Metabolic crisis   Critical care was time spent personally by me on the following activities:  Blood draw for specimens, development of treatment plan with patient or surrogate, discussions with consultants, evaluation of patient's response to treatment, examination of patient, obtaining history from patient or surrogate, ordering and performing treatments and interventions, ordering and review of laboratory studies, ordering and review of radiographic studies, re-evaluation of patient's condition, review of old charts and pulse oximetry   I assumed direction of critical care for this patient from another provider in my  specialty: no   Comments:     Patient with hypokalemia treated with lokelma, insulin, and dialysis   (including critical care time)  Medications Ordered in ED Medications  sodium zirconium cyclosilicate (LOKELMA) packet 10 g (has no administration in time range)  insulin aspart (novoLOG) injection 5 Units (has no administration in time range)    And  dextrose 50 % solution 50 mL (has no administration in time range)  calcium gluconate inj 10% (1 g) URGENT USE ONLY! (has no administration in time range)  Chlorhexidine Gluconate Cloth 2  % PADS 6 each (has no administration in time range)    ED Course  I have reviewed the triage vital signs and the nursing notes.  Pertinent labs & imaging results that were available during my care of the patient were reviewed by me and considered in my medical decision making (see chart for details).    MDM Rules/Calculators/A&P                      Patient presenting for evaluation of right leg pain.  Physical exam shows patient appears chronically ill, but nontoxic.  No obvious circumferential infection, however patient is extremely tender.  He does have chronic appearing ulcers.  Consider DVT due to calf tenderness.  Consider infection including early cellulitis and osteo.  Labs obtained from triage show critically high potassium at 7.  This patient has missed multiple dialysis sessions, this is likely related.  Will treat with lokelma. ekg ordered. Will consult with nephrology.   I discussed with Dr. Carolin Sicks from nephrology.  He will discuss with his colleague, Dr. Marval Regal will evaluate the patient. Dr. Carolynne Edouard feels pt would benefit from admission. Requests pt also get medical tx for hyperkalemia. EKG without peaked t waves. No change in qt.   I discussed with Dr. Marval Regal from nephrology. Pt will need dialysis tonight. Will need rapid covid test prior to dialysis.   Discussed plan with pt who is agreeable.   Pt signed out to Joaquin Courts, PA-C for f/u on [place]. If negative, pt to be tx for cellulitis. Regardless, pt with need to be admitted for dialysis and hyperkalemia.   Final Clinical Impression(s) / ED Diagnoses Final diagnoses:  Hyperkalemia  Right leg pain    Rx / DC Orders ED Discharge Orders    None       Franchot Heidelberg, PA-C 12/29/18 1645    Daleen Bo, MD 12/31/18 1332

## 2018-12-29 NOTE — Progress Notes (Signed)
Right lower extremity venous duplex completed. Refer to "CV Proc" under chart review to view preliminary results.  12/29/2018 5:27 PM Kelby Aline., MHA, RVT, RDCS, RDMS

## 2018-12-30 ENCOUNTER — Inpatient Hospital Stay (HOSPITAL_COMMUNITY): Payer: Medicare HMO

## 2018-12-30 DIAGNOSIS — L02611 Cutaneous abscess of right foot: Secondary | ICD-10-CM

## 2018-12-30 DIAGNOSIS — L03031 Cellulitis of right toe: Secondary | ICD-10-CM

## 2018-12-30 DIAGNOSIS — E875 Hyperkalemia: Secondary | ICD-10-CM

## 2018-12-30 DIAGNOSIS — I10 Essential (primary) hypertension: Secondary | ICD-10-CM

## 2018-12-30 DIAGNOSIS — N186 End stage renal disease: Secondary | ICD-10-CM

## 2018-12-30 DIAGNOSIS — Z992 Dependence on renal dialysis: Secondary | ICD-10-CM

## 2018-12-30 LAB — SEDIMENTATION RATE: Sed Rate: 57 mm/hr — ABNORMAL HIGH (ref 0–16)

## 2018-12-30 LAB — BASIC METABOLIC PANEL
Anion gap: 14 (ref 5–15)
BUN: 14 mg/dL (ref 6–20)
CO2: 27 mmol/L (ref 22–32)
Calcium: 9.3 mg/dL (ref 8.9–10.3)
Chloride: 95 mmol/L — ABNORMAL LOW (ref 98–111)
Creatinine, Ser: 4.5 mg/dL — ABNORMAL HIGH (ref 0.61–1.24)
GFR calc Af Amer: 15 mL/min — ABNORMAL LOW (ref >60)
GFR calc non Af Amer: 13 mL/min — ABNORMAL LOW (ref >60)
Glucose, Bld: 153 mg/dL — ABNORMAL HIGH (ref 70–99)
Potassium: 4.1 mmol/L (ref 3.5–5.1)
Sodium: 136 mmol/L (ref 135–145)

## 2018-12-30 LAB — C-REACTIVE PROTEIN: CRP: 7.6 mg/dL — ABNORMAL HIGH (ref <1.0)

## 2018-12-30 LAB — MRSA PCR SCREENING: MRSA by PCR: NEGATIVE

## 2018-12-30 LAB — PREALBUMIN: Prealbumin: 13.3 mg/dL — ABNORMAL LOW (ref 18–38)

## 2018-12-30 LAB — LACTIC ACID, PLASMA: Lactic Acid, Venous: 1.4 mmol/L (ref 0.5–1.9)

## 2018-12-30 MED ORDER — METRONIDAZOLE 500 MG PO TABS
500.0000 mg | ORAL_TABLET | Freq: Three times a day (TID) | ORAL | Status: AC
  Administered 2018-12-30 – 2019-01-01 (×6): 500 mg via ORAL
  Filled 2018-12-30 (×6): qty 1

## 2018-12-30 MED ORDER — SODIUM CHLORIDE 0.9 % IV SOLN
1.0000 g | INTRAVENOUS | Status: DC
  Administered 2018-12-30 – 2019-01-01 (×2): 1 g via INTRAVENOUS
  Filled 2018-12-30 (×4): qty 1

## 2018-12-30 MED ORDER — COLLAGENASE 250 UNIT/GM EX OINT
TOPICAL_OINTMENT | Freq: Every day | CUTANEOUS | Status: DC
  Filled 2018-12-30: qty 30

## 2018-12-30 MED ORDER — SODIUM CHLORIDE 0.9 % IV SOLN
INTRAVENOUS | Status: DC | PRN
  Administered 2018-12-30: 250 mL via INTRAVENOUS

## 2018-12-30 NOTE — Progress Notes (Addendum)
[facility] Progress Note   Subjective:   Seen and examined at bedside.  Pain well controlled. No new complaints.  HD tolerated well yesterday.   Objective Vitals:   12/30/18 0000 12/30/18 0005 12/30/18 0117 12/30/18 0945  BP: (!) 155/81 (!) 166/91 (!) 121/99 128/71  Pulse: 97 98 97 84  Resp: 15 15 18 18   Temp:  97.8 F (36.6 C) 98.9 F (37.2 C) 99.8 F (37.7 C)  TempSrc:  Oral Oral Oral  SpO2:  94% 94%   Weight:   68.3 kg   Height:   5' 11.5" (1.816 m)    Physical Exam General:NAD, chronically ill appearing male, laying in bed Heart:RRR Lungs:CTAB Extremities:L AKA, R no edema, foot wound dressed Dialysis Access: LU AVF +b   Filed Weights   12/30/18 0117  Weight: 68.3 kg    Intake/Output Summary (Last 24 hours) at 12/30/2018 1300 Last data filed at 12/30/2018 0800 Gross per 24 hour  Intake 300.43 ml  Output 1670 ml  Net -1369.57 ml    Additional Objective Labs: Basic Metabolic Panel: Recent Labs  Lab 12/29/18 1244 12/30/18 0223  NA 138 136  K 7.0* 4.1  CL 95* 95*  CO2 24 27  GLUCOSE 125* 153*  BUN 53* 14  CREATININE 9.94* 4.50*  CALCIUM 9.6 9.3   Liver Function Tests: Recent Labs  Lab 12/29/18 1244  AST 84*  ALT 51*  ALKPHOS 143*  BILITOT 1.1  PROT 8.0  ALBUMIN 3.0*   No results for input(s): LIPASE, AMYLASE in the last 168 hours. CBC: Recent Labs  Lab 12/29/18 1244  WBC 8.9  NEUTROABS 7.0  HGB 10.3*  HCT 32.1*  MCV 93.9  PLT 191   Blood Culture    Component Value Date/Time   SDES BLOOD RIGHT WRIST 11/24/2018 1432   SPECREQUEST  11/24/2018 1432    BOTTLES DRAWN AEROBIC AND ANAEROBIC Blood Culture adequate volume   CULT  11/24/2018 1432    NO GROWTH 5 DAYS Performed at [facility], [place] [address] [place], [place] [address]    REPTSTATUS 11/29/2018 FINAL 11/24/2018 1432    Cardiac Enzymes: No results for input(s): CKTOTAL, CKMB, CKMBINDEX, TROPONINI in the last 168 hours. CBG: No results for  input(s): GLUCAP in the last 168 hours. Iron Studies: No results for input(s): IRON, TIBC, TRANSFERRIN, FERRITIN in the last 72 hours. Lab Results  Component Value Date   INR 1.2 11/24/2018   INR 1.30 11/08/2016   INR 1.2 08/17/2016   Studies/Results: DG Chest Portable 1 View  Result Date: 12/29/2018 CLINICAL DATA:  Crackles, missed dialysis. EXAM: PORTABLE CHEST 1 VIEW COMPARISON:  11/24/2018 FINDINGS: Cardiomediastinal contours with cardiac enlargement accentuated by low volume expansion and portable technique. Increased interstitial markings present bilaterally. Signs of linear opacities at the left lung base. No acute bone finding. IMPRESSION: Signs of mild interstitial edema and basilar atelectasis. Electronically Signed   By: Zetta Bills M.D.   On: 12/29/2018 18:36   VAS [place] LOWER EXTREMITY VENOUS (DVT) (ONLY [facility] & [facility] 7a-7p)  Result Date: 12/29/2018  Lower Venous Study Indications: Pain.  Limitations: Poor ultrasound/tissue interface and restricted mobility. Performing Technologist: Maudry Mayhew MHA, RDMS, RVT, RDCS  Examination Guidelines: A complete evaluation includes B-mode imaging, spectral Doppler, color Doppler, and power Doppler as needed of all accessible portions of each vessel. Bilateral testing is considered an integral part of a complete examination. Limited examinations for reoccurring indications may be performed as noted.  +---------+---------------+---------+-----------+----------+--------------+ RIGHT  CompressibilityPhasicitySpontaneityPropertiesThrombus Aging +---------+---------------+---------+-----------+----------+--------------+ FV Prox  Full                                                        +---------+---------------+---------+-----------+----------+--------------+ FV Mid   Full                                                        +---------+---------------+---------+-----------+----------+--------------+ FV DistalFull                                                         +---------+---------------+---------+-----------+----------+--------------+ POP      Full           No       Yes                                 +---------+---------------+---------+-----------+----------+--------------+ PTV      Full                                                        +---------+---------------+---------+-----------+----------+--------------+ PERO     Full                                                        +---------+---------------+---------+-----------+----------+--------------+ Unable to visualize right common femoral vein and saphenofemoral junction.   Summary: Right: There is no evidence of deep vein thrombosis in the lower extremity. However, portions of this examination were limited- see technologist comments above. No cystic structure found in the popliteal fossa. Lower extremity venous flow is pulsatile, suggestive of possible elevated right heart pressure.  *See table(s) above for measurements and observations.    Preliminary     Medications: . sodium chloride 250 mL (12/30/18 0126)   . amLODipine  5 mg Oral QPM  . aspirin EC  81 mg Oral QHS  . carvedilol  12.5 mg Oral BID WC  . Chlorhexidine Gluconate Cloth  6 each Topical Q0600  . [START ON 01/01/2019] doxercalciferol  4 mcg Intravenous Q M,W,F-HD  . gabapentin  200 mg Oral Daily  . heparin  5,000 Units Subcutaneous Q8H  . metroNIDAZOLE  500 mg Oral Q8H  . montelukast  10 mg Oral QHS  . sevelamer carbonate  3,200 mg Oral TID WC  . temazepam  30 mg Oral QHS    Dialysis Orders: MWF  - [facility]  4hrs, BFR 400, DFR 800,  EDW 65.5kg, 2K/ 2Ca  Access: LU AVF  Heparin 4000 unit bolus Mircera 50 mcg q2wks - last 12/2 Hectorol 61mcg IV qHD   Assessment/Plan: 1.  hyperkalemia - Resolved with dialysis. K 4.1. on  Zosyn, flagyl 2. Foot pain 2/2 worsening ulcer possible cellulitis - MRI results pending.  3.  ESRD -  On HD MWF.  Missed last 2 HD.   Plan for urgent HD tonight due to ^K using low K bath.  4.  Hypertension/volume  - BP well controlled. Does not appear volume overloaded on exam. Net UF goal 1666mL removed with HD yesterday.  Under dry, if weights correct will need to be lowered at d/c. 5.  Anemia of CKD - Hgb 10.3. No indication for ESA at this time.  6.  Secondary Hyperparathyroidism -  Ca at goal. Phos elevated at OP. Continue VDRA and binders. 7.  Nutrition - Renal diet w/fluid restrictions.  8. Hx CAD 9. DM - per primary 10. Hx seizures 11. PAD s/p amputations   Jen Mow, PA-C [facility] Pager: [phone] 12/30/2018,1:00 PM  LOS: 1 day   I have seen and examined this patient and agree with plan and assessment in the above note with renal recommendations/intervention highlighted.  [place] for osteo of right leg.  MRI pending.  Potassium improved after HD.  Broadus John A Somaya Grassi,MD 12/30/2018 2:16 PM  3

## 2018-12-30 NOTE — Consult Note (Signed)
[place] Nurse Consult Note: Reason for Consult: Right anterior foot wound, chronic, nonhealing. WOC Nursing is simultaneously consulted with Orthopedics (Dr. Meridee Score) and we defer to his expertise. I will provide wound care orders for topical care until Dr. Jess Barters consultation and the development of the definitive POC. The photograph taken today by Dr. Algis Liming is appreciated. The Bedside RN, [name] will take wound measurements and place on the flowsheet.  Her flexibility and assisstance is also appreciated. Wound type: Chronic, full thickness, infectious Pressure Injury POA: N/A Measurement: See Nursing Flowsheet Wound bed: 100% necrotic with distal end of wound presenting with soft, nonviable slough. Drainage (amount, consistency, odor) small amount of light yellow drainage on old dressing Periwound: erythematous, edematous, warm Dressing procedure/placement/frequency: Until the orthopedics consultation, I will provide Nursing with guidance in topical care using a once daily application of collagenase (Santyl) to be applied after soap and water cleanse. We will pad with an ABD and secure with Kerlix roll gauze.  [name] nursing team will not follow, but will remain available to this patient, the nursing and medical teams.  Please re-consult if needed. Thanks, [name], MSN, RN, [place], [name]  Pager# [phone]

## 2018-12-30 NOTE — Progress Notes (Signed)
Pharmacy Antibiotic Note  Matthew Kline is a 58 y.o. male admitted on 12/29/2018 with R foot wound infection.  Pharmacy has been consulted for cefepime dosing. PMH significant for ESRD on HD MWF--missed last two HD sessions.   Today, WBC WNL, Tmax 99.8. Urgent HD scheduled for tonight. Will scheduled cefepime post-HD.   Plan: Cefepime 1g IV q24hr after HD on HD days Monitor clinical progress, c/s, abx plan/LOT Monitor HD schedule/tolerance inpatient  Height: 5' 11.5" (181.6 cm) Weight: 150 lb 9.2 oz (68.3 kg) IBW/kg (Calculated) : 76.45  Temp (24hrs), Avg:98.9 F (37.2 C), Min:97.8 F (36.6 C), Max:99.8 F (37.7 C)  Recent Labs  Lab 12/29/18 1244 12/30/18 0223  WBC 8.9  --   CREATININE 9.94* 4.50*  LATICACIDVEN 1.9 1.4    Estimated Creatinine Clearance: 17.1 mL/min (A) (by C-G formula based on SCr of 4.5 mg/dL (H)).    Allergies  Allergen Reactions  . Morphine And Related Other (See Comments)    hallucinations  . Tygacil [Tigecycline] Nausea And Vomiting and Other (See Comments)  . Imodium [Loperamide] Itching    Agnes Lawrence, PharmD PGY1 Pharmacy Resident

## 2018-12-30 NOTE — Progress Notes (Signed)
Patient attempting to get up to go to the bathroom,after having been helped a few minutes ago using bedside commode. Patient has left AKA and unsteady on his feet. Bedside commode was brought beside patient's bed but patient insists to go to the bathroom. Explained to patient it's unsafe at this time to go to the bathroom,patient had sleeping medicine and was falling asleep while sitting at side of bed. Patient became argumentative and ,told staff he'll call the police while adamantly insisting to go to the bathroom.Valaria Good, Wonda Cheng, RN

## 2018-12-30 NOTE — Progress Notes (Signed)
New Admission Note:   Arrival Method: Arrived from HD via stretcher Mental Orientation: Telemetry: Box # 10 Assessment: Completed Skin: See doc flowsheet IV: Rt FA Pain: Denies  Tubes: N/A Safety Measures: Safety Fall Prevention Plan has been discussed.  Admission: Completed 5MW Orientation: Patient has been orientated to the room, unit and staff.  Family: None at bedside  Orders have been reviewed and implemented. Will continue to monitor the patient. Call light has been placed within reach and bed alarm has been activated.   Rayden Scheper [employer], RN-BC Phone number: [phone]

## 2018-12-30 NOTE — Evaluation (Signed)
Physical Therapy Evaluation Patient Details Name: Matthew [facility] MRN: [id number] DOB: 27-Feb-1960 Today's Date: 12/30/2018   History of Present Illness  58 yo male with onset of RLE cellulitis and pain was admitted, has R foot wound with findings of ulceration dorsal to talonavicular joint, over tib anterior tendon without tenosynovitis, no osteomyelitis, achilles tendinosis with no tear, and potential denervation vs myositis from IM edema from intrinsics of foot.  PMHx:  L AK amputation, DM, HTN, lacunar stroke, CHF, CAD, CABG x 3,   Clinical Impression  Talked with nurse about pt's confusion, and struggle to have an extended talk with frequent sleepy moments and delay in responses.  Pt agreed he thought he could get his prosthesis to hosp, and should be able to walk with PT when his lethargy is better.  Recommending CIR for now given the change in RLE strength, and his plan may upgrade to home if prosthesis makes a full eval of walking and stair climbing feasible.  Follow acutely for these needs.    Follow Up Recommendations CIR    Equipment Recommendations  None recommended by PT    Recommendations for Other Services Rehab consult     Precautions / Restrictions Precautions Precautions: Fall Precaution Comments: lethargic post pain meds Restrictions Weight Bearing Restrictions: Yes LLE Weight Bearing: Non weight bearing      Mobility  Bed Mobility Overal bed mobility: Modified Independent;Needs Assistance Bed Mobility: Supine to Sit;Sit to Supine     Supine to sit: Supervision Sit to supine: Supervision   General bed mobility comments: supervised due to lethargy after meds  Transfers Overall transfer level: Needs assistance Equipment used: 1 person hand held assist;Rolling walker (2 wheeled) Transfers: Sit to/from Stand Sit to Stand: Min assist         General transfer comment: min with bedrail but pt will not exert to stand from bed wtih  walker  Ambulation/Gait             General Gait Details: pt declined to try  Stairs            Wheelchair Mobility    Modified Rankin (Stroke Patients Only)       Balance Overall balance assessment: Needs assistance Sitting-balance support: Bilateral upper extremity supported;Feet supported Sitting balance-Leahy Scale: Fair Sitting balance - Comments: fair but as he becomes sleepier will lean forward on bed   Standing balance support: Single extremity supported;During functional activity Standing balance-Leahy Scale: Poor                               Pertinent Vitals/Pain Pain Assessment: 0-10 Pain Score: 8  Pain Location: R foot once on the floor Pain Intervention(s): Limited activity within patient's tolerance;Monitored during session;Premedicated before session;Repositioned    Home Living Family/patient expects to be discharged to:: Private residence Living Arrangements: Children Available Help at Discharge: Family;Available 24 hours/day Type of Home: House Home Access: Stairs to enter   [employer] of Steps: 2 Home Layout: One level Home Equipment: Walker - 2 wheels;Shower seat;Wheelchair - manual Additional Comments: forearm crutches    Prior Function Level of Independence: Independent with assistive device(s)         Comments: has a prosthesis but not at [facility]   Dominant Hand: Right    Extremity/Trunk Assessment   Upper Extremity Assessment Upper Extremity Assessment: Overall WFL for tasks assessed    Lower Extremity Assessment Lower Extremity Assessment:  LLE deficits/detail;RLE deficits/detail RLE Deficits / Details: strength is 3+ to 4- RLE Coordination: decreased gross motor LLE Deficits / Details: strength was 4+    Cervical / Trunk Assessment Cervical / Trunk Assessment: Normal  Communication   Communication: No difficulties  Cognition Arousal/Alertness: Lethargic Behavior During  Therapy: Flat affect Overall Cognitive Status: Difficult to assess                                 General Comments: long delay between questions and answers      General Comments General comments (skin integrity, edema, etc.): Pt is too sleepy to try to walk, but is capable of scooting up the bed without help and stands with min assist using bedrail    Exercises     Assessment/Plan    PT Assessment Patient needs continued PT services  PT Problem List Decreased strength;Decreased range of motion;Decreased activity tolerance;Decreased balance;Decreased mobility;Decreased coordination;Decreased cognition;Decreased knowledge of use of DME;Decreased safety awareness;Decreased skin integrity;Pain       PT Treatment Interventions DME instruction;Gait training;Stair training;Functional mobility training;Therapeutic activities;Therapeutic exercise;Balance training;Neuromuscular re-education;Patient/family education    PT Goals (Current goals can be found in the Care Plan section)  Acute Rehab PT Goals Patient Stated Goal: none stated PT Goal Formulation: Patient unable to participate in goal setting Time For Goal Achievement: 01/13/19 Potential to Achieve Goals: Good    Frequency Min 3X/week   Barriers to discharge Inaccessible home environment has stairs to enter house    Co-evaluation               AM-PAC PT "6 Clicks" Mobility  Outcome Measure Help needed turning from your back to your side while in a flat bed without using bedrails?: None Help needed moving from lying on your back to sitting on the side of a flat bed without using bedrails?: A Little Help needed moving to and from a bed to a chair (including a wheelchair)?: A Little Help needed standing up from a chair using your arms (e.g., wheelchair or bedside chair)?: A Little Help needed to walk in hospital room?: Total Help needed climbing 3-5 steps with a railing? : Total 6 Click Score: 15    End  of Session Equipment Utilized During Treatment: Gait belt Activity Tolerance: Patient limited by fatigue;Patient limited by lethargy;Patient limited by pain Patient left: in bed;with call bell/phone within reach;with bed alarm set Nurse Communication: Mobility status PT Visit Diagnosis: Unsteadiness on feet (R26.81);Muscle weakness (generalized) (M62.81);Pain Pain - Right/Left: Right Pain - part of body: Leg;Ankle and joints of foot    Time: 8675-4492 PT Time Calculation (min) (ACUTE ONLY): 26 min   Charges:   PT Evaluation $PT Eval Moderate Complexity: 1 Mod PT Treatments $Therapeutic Activity: 8-22 mins       Ramond Dial 12/30/2018, 5:41 PM  Mee Hives, PT MS Acute Rehab Dept. Number: [place] [facility] [place]

## 2018-12-30 NOTE — Progress Notes (Signed)
PROGRESS NOTE   Matthew Kline  MRN:[id number]    DOB: 02/28/1960    DOA: 12/29/2018  PCP: [facility]   I have briefly reviewed patients previous medical records in [facility].  Chief Complaint:   Chief Complaint  Patient presents with  . Wound Infection    Brief Narrative:  58-year-old male, lives with family, reportedly ambulates with the help of left lower extremity prosthesis, PMH of ESRD due to diabetic nephropathy on MWF HD, DM 2 with peripheral neuropathy, HTN, HLD, CVA/lacunar stroke, CAD, s/p CABG, chronic diastolic CHF, PAD s/p left AKA and right great toe amputation, chronic foot ulcers, PMR with chronic pain, GERD, tobacco abuse presented to ED due to worsening of right chronic foot ulcer with associated drainage, redness and pain of 3 days duration.  He missed 2 days of dialysis due to the pain and difficulty to weight-bear.  In the ED, potassium 7, creatinine 9.94.  Admitted for RLE infected wound with cellulitis, possible abscess and osteomyelitis, hyperkalemia after missed dialysis.  Nephrology consulted and emergently underwent HD on day of admission with resolution of hyperkalemia.  Orthopedics/Dr. Duda consulted.  MRI of right foot pending.   Assessment & Plan:  Active Problems:   Cellulitis and abscess of toe of right foot   Infected right foot wound with cellulitis, possible abscess and osteomyelitis  Complicating underlying PAD and peripheral neuropathy.  Appears to be an arterial ulcer.  RLE venous Doppler negative for DVT.  MRI of right foot pending.  MRSA PCR screen negative on 12/19 and 11/15.  HIV screen - June 2020.  Concern for Pseudomonas infection given some immunocompromise state from underlying diabetes and ESRD on HD.  Patient had been empirically started on IV Zosyn  However utilized lower extremity wound order set and changed Zosyn to IV cefepime and p.o. Flagyl, requested ABI, follow-up CRP and ESR.  WOC team  consulted.  Consulted Dr. Duda/orthopedics for input.  May require vascular surgery consultation pending his recommendations and ABI studies.  Hyperkalemia  Due to missed dialysis x2.  Potassium 7 on admission and had hyperacute T waves.  Received IV calcium gluconate, insulin dextrose, Lokelma and underwent urgent dialysis on day of admission.  Resolved.  ESRD on MWF HD  Nephrology consultation appreciated.  S/p HD overnight of admission.  Essential hypertension  Mildly uncontrolled.  Volume management across dialysis.  Continue amlodipine, carvedilol.  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  Most recent A1c 5.7.  Continue gabapentin  Anemia of ESRD  Stable.  Hemoglobin 10.3.  Secondary hyperparathyroidism  Management per nephrology.  PAD, s/p left AKA after multiple prior amputations, s/p right first toe amputation  May need to involve vascular surgery as discussed above.  CAD, s/p CABG  No anginal symptoms.  Continue aspirin, carvedilol.  Chronic pain  Supportive treatment.  Judicious use of opioids.  Mild transaminitis  Unclear etiology.  Follow CMP.  DVT prophylaxis: Subcutaneous heparin Code Status: Full Family Communication: None at bedside Disposition: DC home pending clinical improvement   Consultants:   Orthopedics  Procedures:   HD early morning of 10/19  Antimicrobials:   IV Zosyn-discontinued 12/19 IV cefepime and oral Flagyl 12/19 >   Subjective:  Patient reports that he stays with his adult kids.  Ambulates with the help of a left lower extremity prosthesis, ongoing pain at right foot ulcer site, worse with touching.  Denies falls or direct trauma.  As per RN, was slightly altered this   morning but subsequently resolved.  Objective:   Vitals:   12/30/18 0000 12/30/18 0005 12/30/18 0117 12/30/18 0945  BP: (!) 155/81 (!) 166/91 (!) 121/99 128/71  Pulse: 97 98 97 84  Resp: _0 Temp:  97.8 F (36.6 C) 98.9 F (37.2 C) 99.8 F (37.7 C)  TempSrc:  Oral Oral Oral  SpO2:  94% 94%   Weight:   68.3 kg   Height:   5' 11.5" (1.816 m)     General exam: Young male, moderately built and thinly nourished lying comfortably propped up in bed without distress Respiratory system: Clear to auscultation. Respiratory effort normal. Cardiovascular system: S1 & S2 heard, RRR. No JVD, murmurs, rubs, gallops or clicks. No pedal edema.  Telemetry personally reviewed: Sinus rhythm. Gastrointestinal system: Abdomen is nondistended, soft and nontender. No organomegaly or masses felt. Normal bowel sounds heard. Central nervous system: Alert and oriented. No focal neurological deficits. Extremities: Symmetric 5 x 5 power.  Healed left AKA stump. Skin: Multiple small ulcers of his right leg that have healed with scabs.  Large ulcer over right anterior ankle as noted in picture below with purulent drainage and surrounding area of erythema, warmth and tenderness without fluctuation or crepitus.  Unable to palpate posterior tibial or dorsalis pedis.  S/p right first ray amputation well-healed. Psychiatry: Judgement and insight appear normal. Mood & affect appropriate.   Picture of right foot taken on 12/30/2018.      Data Reviewed:   I have personally reviewed following labs and imaging studies   CBC: Recent Labs  Lab 12/29/18 1244  WBC 8.9  NEUTROABS 7.0  HGB 10.3*  HCT 32.1*  MCV 93.9  PLT 735    Basic Metabolic Panel: Recent Labs  Lab 12/29/18 1244 12/30/18 0223  NA 138 136  K 7.0* 4.1  CL 95* 95*  CO2 24 27  GLUCOSE 125* 153*  BUN 53* 14  CREATININE 9.94* 4.50*  CALCIUM 9.6 9.3    Liver Function Tests: Recent Labs  Lab 12/29/18 1244  AST 84*  ALT 51*  ALKPHOS 143*  BILITOT 1.1  PROT 8.0  ALBUMIN 3.0*    CBG: No results for input(s): GLUCAP in the last 168 hours.  Microbiology Studies:   Recent Results (from the past 240 hour(s))  Respiratory  Panel by RT PCR (Flu A&B, Covid) - Nasopharyngeal Swab     Status: None   Collection Time: 12/29/18  4:50 PM   Specimen: Nasopharyngeal Swab  Result Value Ref Range Status   SARS Coronavirus 2 by RT PCR NEGATIVE NEGATIVE Final    Comment: (NOTE) SARS-CoV-2 target nucleic acids are NOT DETECTED. The SARS-CoV-2 RNA is generally detectable in upper respiratoy specimens during the acute phase of infection. The lowest concentration of SARS-CoV-2 viral copies this assay can detect is 131 copies/mL. A negative result does not preclude SARS-Cov-2 infection and should not be used as the sole basis for treatment or other patient management decisions. A negative result may occur with  improper specimen collection/handling, submission of specimen other than nasopharyngeal swab, presence of viral mutation(s) within the areas targeted by this assay, and inadequate number of viral copies (<131 copies/mL). A negative result must be combined with clinical observations, patient history, and epidemiological information. The expected result is Negative. Fact Sheet for Patients:  PinkCheek.be Fact Sheet for Healthcare Providers:  GravelBags.it This test is not yet ap proved or cleared by the [place] FDA and  has been authorized for detection and/or  diagnosis of SARS-CoV-2 by FDA under an Emergency Use Authorization (EUA). This EUA will remain  in effect (meaning this test can be used) for the duration of the COVID-19 declaration under Section 564(b)(1) of the Act, 21 U.S.C. section 360bbb-3(b)(1), unless the authorization is terminated or revoked sooner.    Influenza A by PCR NEGATIVE NEGATIVE Final   Influenza B by PCR NEGATIVE NEGATIVE Final    Comment: (NOTE) The Xpert Xpress SARS-CoV-2/FLU/RSV assay is intended as an aid in  the diagnosis of influenza from Nasopharyngeal swab specimens and  should not be used as a sole basis for  treatment. Nasal washings and  aspirates are unacceptable for Xpert Xpress SARS-CoV-2/FLU/RSV  testing. Fact Sheet for Patients: https://www.fda.gov/media/142436/download Fact Sheet for Healthcare Providers: https://www.fda.gov/media/142435/download This test is not yet approved or cleared by the [place] FDA and  has been authorized for detection and/or diagnosis of SARS-CoV-2 by  FDA under an Emergency Use Authorization (EUA). This EUA will remain  in effect (meaning this test can be used) for the duration of the  Covid-19 declaration under Section 564(b)(1) of the Act, 21  U.S.C. section 360bbb-3(b)(1), unless the authorization is  terminated or revoked. Performed at [facility], [address] [place], [place] [address]   MRSA PCR Screening     Status: None   Collection Time: 12/30/18  1:53 AM   Specimen: Nasal Mucosa; Nasopharyngeal  Result Value Ref Range Status   MRSA by PCR NEGATIVE NEGATIVE Final    Comment:        The GeneXpert MRSA Assay (FDA approved for NASAL specimens only), is one component of a comprehensive MRSA colonization surveillance program. It is not intended to diagnose MRSA infection nor to guide or monitor treatment for MRSA infections. Performed at [facility], [address] [place], [place] [address]      Radiology Studies:  DG Chest Portable 1 View  Result Date: 12/29/2018 CLINICAL DATA:  Crackles, missed dialysis. EXAM: PORTABLE CHEST 1 VIEW COMPARISON:  11/24/2018 FINDINGS: Cardiomediastinal contours with cardiac enlargement accentuated by low volume expansion and portable technique. Increased interstitial markings present bilaterally. Signs of linear opacities at the left lung base. No acute bone finding. IMPRESSION: Signs of mild interstitial edema and basilar atelectasis. Electronically Signed   By: Geoffrey  Wile M.D.   On: 12/29/2018 18:36   VAS US LOWER EXTREMITY VENOUS (DVT) (ONLY [facility] & [facility] 7a-7p)  Result Date:  12/29/2018  Lower Venous Study Indications: Pain.  Limitations: Poor ultrasound/tissue interface and restricted mobility. Performing Technologist: Michelle Simonetti MHA, RDMS, RVT, RDCS  Examination Guidelines: A complete evaluation includes B-mode imaging, spectral Doppler, color Doppler, and power Doppler as needed of all accessible portions of each vessel. Bilateral testing is considered an integral part of a complete examination. Limited examinations for reoccurring indications may be performed as noted.  +---------+---------------+---------+-----------+----------+--------------+ RIGHT    CompressibilityPhasicitySpontaneityPropertiesThrombus Aging +---------+---------------+---------+-----------+----------+--------------+ FV Prox  Full                                                        +---------+---------------+---------+-----------+----------+--------------+ FV Mid   Full                                                        +---------+---------------+---------+-----------+----------+--------------+   FV DistalFull                                                        +---------+---------------+---------+-----------+----------+--------------+ POP      Full           No       Yes                                 +---------+---------------+---------+-----------+----------+--------------+ PTV      Full                                                        +---------+---------------+---------+-----------+----------+--------------+ PERO     Full                                                        +---------+---------------+---------+-----------+----------+--------------+ Unable to visualize right common femoral vein and saphenofemoral junction.   Summary: Right: There is no evidence of deep vein thrombosis in the lower extremity. However, portions of this examination were limited- see technologist comments above. No cystic structure found in the popliteal  fossa. Lower extremity venous flow is pulsatile, suggestive of possible elevated right heart pressure.  *See table(s) above for measurements and observations.    Preliminary      Scheduled Meds:   . amLODipine  5 mg Oral QPM  . aspirin EC  81 mg Oral QHS  . carvedilol  12.5 mg Oral BID WC  . Chlorhexidine Gluconate Cloth  6 each Topical Q0600  . [START ON 01/01/2019] doxercalciferol  4 mcg Intravenous Q M,W,F-HD  . gabapentin  200 mg Oral Daily  . heparin  5,000 Units Subcutaneous Q8H  . metroNIDAZOLE  500 mg Oral Q8H  . montelukast  10 mg Oral QHS  . sevelamer carbonate  3,200 mg Oral TID WC  . temazepam  30 mg Oral QHS    Continuous Infusions:   . sodium chloride 250 mL (12/30/18 0126)     LOS: 1 day     Vernell Leep, MD, [place], [facility]. [facility]    To contact the attending provider between 7A-7P or the covering provider during after hours 7P-7A, please log into the web site [email or web address] and access using universal [place] password for that web site. If you do not have the password, please call the hospital operator.  12/30/2018, 12:53 PM

## 2018-12-31 ENCOUNTER — Inpatient Hospital Stay (HOSPITAL_COMMUNITY): Payer: Medicare HMO | Admitting: Certified Registered Nurse Anesthetist

## 2018-12-31 ENCOUNTER — Encounter (HOSPITAL_COMMUNITY)
Admission: EM | Disposition: A | Discharge status: Home Health | Payer: Self-pay | Source: Home / Self Care | Attending: Internal Medicine

## 2018-12-31 ENCOUNTER — Encounter (HOSPITAL_COMMUNITY): Payer: Self-pay | Admitting: Internal Medicine

## 2018-12-31 DIAGNOSIS — E440 Moderate protein-calorie malnutrition: Secondary | ICD-10-CM

## 2018-12-31 DIAGNOSIS — E1142 Type 2 diabetes mellitus with diabetic polyneuropathy: Secondary | ICD-10-CM

## 2018-12-31 DIAGNOSIS — M65071 Abscess of tendon sheath, right ankle and foot: Secondary | ICD-10-CM

## 2018-12-31 DIAGNOSIS — E43 Unspecified severe protein-calorie malnutrition: Secondary | ICD-10-CM

## 2018-12-31 HISTORY — PX: AMPUTATION: SHX166

## 2018-12-31 HISTORY — PX: APPLICATION OF WOUND VAC: SHX5189

## 2018-12-31 LAB — POCT I-STAT, CHEM 8
BUN: 32 mg/dL — ABNORMAL HIGH (ref 6–20)
Calcium, Ion: 1.09 mmol/L — ABNORMAL LOW (ref 1.15–1.40)
Chloride: 98 mmol/L (ref 98–111)
Creatinine, Ser: 6.5 mg/dL — ABNORMAL HIGH (ref 0.61–1.24)
Glucose, Bld: 94 mg/dL (ref 70–99)
HCT: 33 % — ABNORMAL LOW (ref 39.0–52.0)
Hemoglobin: 11.2 g/dL — ABNORMAL LOW (ref 13.0–17.0)
Potassium: 4.4 mmol/L (ref 3.5–5.1)
Sodium: 137 mmol/L (ref 135–145)
TCO2: 29 mmol/L (ref 22–32)

## 2018-12-31 LAB — TYPE AND SCREEN
ABO/RH(D): O NEG
Antibody Screen: NEGATIVE

## 2018-12-31 LAB — SURGICAL PCR SCREEN
MRSA, PCR: NEGATIVE
Staphylococcus aureus: NEGATIVE

## 2018-12-31 SURGERY — AMPUTATION BELOW KNEE
Anesthesia: Monitor Anesthesia Care | Site: Knee | Laterality: Right

## 2018-12-31 MED ORDER — POVIDONE-IODINE 10 % EX SWAB: 2.0000 "application " | Freq: Once | CUTANEOUS | Status: AC

## 2018-12-31 MED ORDER — CEFAZOLIN SODIUM-DEXTROSE 2-4 GM/100ML-% IV SOLN
INTRAVENOUS | Status: AC
  Filled 2018-12-31: qty 100

## 2018-12-31 MED ORDER — HYDROMORPHONE HCL 1 MG/ML IJ SOLN
0.5000 mg | INTRAMUSCULAR | Status: DC | PRN
  Administered 2018-12-31 – 2019-01-02 (×6): 1 mg via INTRAVENOUS
  Filled 2018-12-31 (×5): qty 1

## 2018-12-31 MED ORDER — ACETAMINOPHEN 325 MG PO TABS
325.0000 mg | ORAL_TABLET | Freq: Four times a day (QID) | ORAL | Status: DC | PRN
  Administered 2019-01-03: 650 mg via ORAL
  Filled 2018-12-31: qty 2

## 2018-12-31 MED ORDER — CHLORHEXIDINE GLUCONATE CLOTH 2 % EX PADS
6.0000 | MEDICATED_PAD | Freq: Every day | CUTANEOUS | Status: DC
  Administered 2019-01-01: 6 via TOPICAL

## 2018-12-31 MED ORDER — ONDANSETRON HCL 4 MG PO TABS: 4.0000 mg | ORAL_TABLET | Freq: Four times a day (QID) | ORAL | Status: DC | PRN

## 2018-12-31 MED ORDER — ENSURE PRE-SURGERY PO LIQD: 296.0000 mL | Freq: Once | ORAL | Status: DC

## 2018-12-31 MED ORDER — ROPIVACAINE HCL 5 MG/ML IJ SOLN
INTRAMUSCULAR | Status: DC | PRN
  Administered 2018-12-31: 15 mL via PERINEURAL
  Administered 2018-12-31: 20 mL via PERINEURAL

## 2018-12-31 MED ORDER — PROPOFOL 500 MG/50ML IV EMUL
INTRAVENOUS | Status: DC | PRN
  Administered 2018-12-31: 100 ug/kg/min via INTRAVENOUS

## 2018-12-31 MED ORDER — 0.9 % SODIUM CHLORIDE (POUR BTL) OPTIME
TOPICAL | Status: DC | PRN
  Administered 2018-12-31: 1000 mL

## 2018-12-31 MED ORDER — FENTANYL CITRATE (PF) 250 MCG/5ML IJ SOLN
INTRAMUSCULAR | Status: AC
  Filled 2018-12-31: qty 5

## 2018-12-31 MED ORDER — FENTANYL CITRATE (PF) 100 MCG/2ML IJ SOLN
INTRAMUSCULAR | Status: DC | PRN
  Administered 2018-12-31: 50 ug via INTRAVENOUS

## 2018-12-31 MED ORDER — ONDANSETRON HCL 4 MG/2ML IJ SOLN
INTRAMUSCULAR | Status: DC | PRN
  Administered 2018-12-31: 4 mg via INTRAVENOUS

## 2018-12-31 MED ORDER — PHENYLEPHRINE HCL (PRESSORS) 10 MG/ML IV SOLN
INTRAVENOUS | Status: AC
  Filled 2018-12-31: qty 1

## 2018-12-31 MED ORDER — CHLORHEXIDINE GLUCONATE 4 % EX LIQD: 60.0000 mL | Freq: Once | CUTANEOUS | Status: DC

## 2018-12-31 MED ORDER — METOCLOPRAMIDE HCL 5 MG PO TABS: 5.0000 mg | ORAL_TABLET | Freq: Three times a day (TID) | ORAL | Status: DC | PRN

## 2018-12-31 MED ORDER — METHOCARBAMOL 1000 MG/10ML IJ SOLN
500.0000 mg | Freq: Four times a day (QID) | INTRAVENOUS | Status: DC | PRN
  Filled 2018-12-31: qty 5

## 2018-12-31 MED ORDER — PROPOFOL 500 MG/50ML IV EMUL: INTRAVENOUS | Status: DC | PRN

## 2018-12-31 MED ORDER — CEFAZOLIN SODIUM-DEXTROSE 2-4 GM/100ML-% IV SOLN
2.0000 g | INTRAVENOUS | Status: AC
  Administered 2018-12-31: 2 g via INTRAVENOUS

## 2018-12-31 MED ORDER — OXYCODONE HCL 5 MG PO TABS
5.0000 mg | ORAL_TABLET | ORAL | Status: DC | PRN
  Administered 2018-12-31: 10 mg via ORAL
  Administered 2019-01-01: 5 mg via ORAL
  Administered 2019-01-01 – 2019-01-02 (×2): 10 mg via ORAL
  Filled 2018-12-31 (×2): qty 2

## 2018-12-31 MED ORDER — METHOCARBAMOL 500 MG PO TABS
500.0000 mg | ORAL_TABLET | Freq: Four times a day (QID) | ORAL | Status: DC | PRN
  Administered 2019-01-02 – 2019-01-04 (×6): 500 mg via ORAL
  Filled 2018-12-31 (×6): qty 1

## 2018-12-31 MED ORDER — MIDAZOLAM HCL 5 MG/5ML IJ SOLN
INTRAMUSCULAR | Status: DC | PRN
  Administered 2018-12-31: 1 mg via INTRAVENOUS

## 2018-12-31 MED ORDER — DOCUSATE SODIUM 100 MG PO CAPS
100.0000 mg | ORAL_CAPSULE | Freq: Two times a day (BID) | ORAL | Status: DC
  Administered 2018-12-31 – 2019-01-05 (×10): 100 mg via ORAL
  Filled 2018-12-31 (×10): qty 1

## 2018-12-31 MED ORDER — BISACODYL 10 MG RE SUPP: 10.0000 mg | Freq: Every day | RECTAL | Status: DC | PRN

## 2018-12-31 MED ORDER — HYDROXYZINE HCL 10 MG PO TABS
10.0000 mg | ORAL_TABLET | Freq: Three times a day (TID) | ORAL | Status: DC | PRN
  Administered 2018-12-31 – 2019-01-03 (×5): 10 mg via ORAL
  Filled 2018-12-31 (×5): qty 1

## 2018-12-31 MED ORDER — LIDOCAINE HCL (CARDIAC) PF 100 MG/5ML IV SOSY
PREFILLED_SYRINGE | INTRAVENOUS | Status: DC | PRN
  Administered 2018-12-31: 60 mg via INTRAVENOUS

## 2018-12-31 MED ORDER — OXYCODONE HCL 5 MG PO TABS
10.0000 mg | ORAL_TABLET | ORAL | Status: DC | PRN
  Administered 2019-01-01 – 2019-01-02 (×2): 10 mg via ORAL
  Filled 2018-12-31 (×3): qty 2

## 2018-12-31 MED ORDER — CAMPHOR-MENTHOL 0.5-0.5 % EX LOTN
TOPICAL_LOTION | CUTANEOUS | Status: DC | PRN
  Filled 2018-12-31 (×2): qty 222

## 2018-12-31 MED ORDER — METOCLOPRAMIDE HCL 5 MG/ML IJ SOLN: 5.0000 mg | Freq: Three times a day (TID) | INTRAMUSCULAR | Status: DC | PRN

## 2018-12-31 MED ORDER — BUPIVACAINE LIPOSOME 1.3 % IJ SUSP
INTRAMUSCULAR | Status: DC | PRN
  Administered 2018-12-31: 10 mL

## 2018-12-31 MED ORDER — SODIUM CHLORIDE 0.9 % IV SOLN: INTRAVENOUS | Status: DC

## 2018-12-31 MED ORDER — HYDROMORPHONE HCL 1 MG/ML IJ SOLN
INTRAMUSCULAR | Status: AC
  Filled 2018-12-31: qty 1

## 2018-12-31 MED ORDER — PROPOFOL 10 MG/ML IV BOLUS
INTRAVENOUS | Status: AC
  Filled 2018-12-31: qty 20

## 2018-12-31 MED ORDER — ONDANSETRON HCL 4 MG/2ML IJ SOLN: 4.0000 mg | Freq: Four times a day (QID) | INTRAMUSCULAR | Status: DC | PRN

## 2018-12-31 MED ORDER — BUPIVACAINE LIPOSOME 1.3 % IJ SUSP
10.0000 mL | Freq: Once | INTRAMUSCULAR | Status: AC
  Filled 2018-12-31: qty 10

## 2018-12-31 MED ORDER — MAGNESIUM CITRATE PO SOLN: 1.0000 | Freq: Once | ORAL | Status: DC | PRN

## 2018-12-31 MED ORDER — MIDAZOLAM HCL 2 MG/2ML IJ SOLN
1.0000 mg | Freq: Once | INTRAMUSCULAR | Status: AC
  Administered 2018-12-31: 1 mg via INTRAVENOUS

## 2018-12-31 MED ORDER — MUPIROCIN 2 % EX OINT
1.0000 "application " | TOPICAL_OINTMENT | Freq: Two times a day (BID) | CUTANEOUS | Status: AC
  Administered 2018-12-31 – 2019-01-04 (×9): 1 via NASAL
  Filled 2018-12-31 (×3): qty 22

## 2018-12-31 MED ORDER — PROPOFOL 10 MG/ML IV BOLUS
INTRAVENOUS | Status: DC | PRN
  Administered 2018-12-31: 20 mg via INTRAVENOUS

## 2018-12-31 MED ORDER — MIDAZOLAM HCL 2 MG/2ML IJ SOLN
INTRAMUSCULAR | Status: AC
  Filled 2018-12-31: qty 2

## 2018-12-31 MED ORDER — FENTANYL CITRATE (PF) 100 MCG/2ML IJ SOLN
100.0000 ug | Freq: Once | INTRAMUSCULAR | Status: AC
  Administered 2018-12-31: 100 ug via INTRAVENOUS

## 2018-12-31 MED ORDER — POLYETHYLENE GLYCOL 3350 17 G PO PACK: 17.0000 g | PACK | Freq: Every day | ORAL | Status: DC | PRN

## 2018-12-31 SURGICAL SUPPLY — 40 items
BLADE SAW RECIP 87.9 MT (BLADE) ×3 IMPLANT
BLADE SURG 21 STRL SS (BLADE) ×3 IMPLANT
BNDG COHESIVE 6X5 TAN STRL LF (GAUZE/BANDAGES/DRESSINGS) IMPLANT
CANISTER WOUND CARE 500ML ATS (WOUND CARE) ×3 IMPLANT
CANISTER WOUNDNEG PRESSURE 500 (CANNISTER) ×3 IMPLANT
COVER SURGICAL LIGHT HANDLE (MISCELLANEOUS) ×3 IMPLANT
COVER WAND RF STERILE (DRAPES) IMPLANT
CUFF TOURN SGL QUICK 34 (TOURNIQUET CUFF) ×2
CUFF TRNQT CYL 34X4.125X (TOURNIQUET CUFF) ×1 IMPLANT
DRAPE INCISE IOBAN 66X45 STRL (DRAPES) ×3 IMPLANT
DRAPE U-SHAPE 47X51 STRL (DRAPES) ×3 IMPLANT
DRESSING PREVENA PLUS CUSTOM (GAUZE/BANDAGES/DRESSINGS) ×1 IMPLANT
DRSG PREVENA PLUS CUSTOM (GAUZE/BANDAGES/DRESSINGS) ×3
DURAPREP 26ML APPLICATOR (WOUND CARE) ×3 IMPLANT
ELECT REM PT RETURN 9FT ADLT (ELECTROSURGICAL) ×3
ELECTRODE REM PT RTRN 9FT ADLT (ELECTROSURGICAL) ×1 IMPLANT
GLOVE BIOGEL PI IND STRL 9 (GLOVE) ×1 IMPLANT
GLOVE BIOGEL PI INDICATOR 9 (GLOVE) ×2
GLOVE SURG ORTHO 9.0 STRL STRW (GLOVE) ×3 IMPLANT
GOWN STRL REUS W/ TWL XL LVL3 (GOWN DISPOSABLE) ×2 IMPLANT
GOWN STRL REUS W/TWL XL LVL3 (GOWN DISPOSABLE) ×4
KIT BASIN OR (CUSTOM PROCEDURE TRAY) ×3 IMPLANT
KIT TURNOVER KIT B (KITS) ×3 IMPLANT
MANIFOLD NEPTUNE II (INSTRUMENTS) ×3 IMPLANT
NS IRRIG 1000ML POUR BTL (IV SOLUTION) ×3 IMPLANT
PACK ORTHO EXTREMITY (CUSTOM PROCEDURE TRAY) ×3 IMPLANT
PAD ARMBOARD 7.5X6 YLW CONV (MISCELLANEOUS) ×3 IMPLANT
PREVENA RESTOR ARTHOFORM 46X30 (CANNISTER) ×3 IMPLANT
SPONGE LAP 18X18 RF (DISPOSABLE) IMPLANT
STAPLER VISISTAT 35W (STAPLE) IMPLANT
STOCKINETTE IMPERVIOUS LG (DRAPES) ×3 IMPLANT
SUT ETHILON 2 0 PSLX (SUTURE) IMPLANT
SUT SILK 2 0 (SUTURE) ×4
SUT SILK 2-0 18XBRD TIE 12 (SUTURE) ×2 IMPLANT
SUT VIC AB 1 CTX 27 (SUTURE) ×6 IMPLANT
SYR CONTROL 10ML LL (SYRINGE) ×3 IMPLANT
TOWEL GREEN STERILE (TOWEL DISPOSABLE) ×3 IMPLANT
TUBE CONNECTING 12'X1/4 (SUCTIONS) ×1
TUBE CONNECTING 12X1/4 (SUCTIONS) ×2 IMPLANT
YANKAUER SUCT BULB TIP NO VENT (SUCTIONS) ×3 IMPLANT

## 2018-12-31 NOTE — Anesthesia Postprocedure Evaluation (Signed)
Anesthesia Post Note  Patient: Matthew Kline  Procedure(s) Performed: AMPUTATION BELOW KNEE (Right Knee) Application Of Wound Vac (Right Knee)     Patient location during evaluation: PACU Anesthesia Type: Regional and MAC Level of consciousness: awake and alert Pain management: pain level controlled Vital Signs Assessment: post-procedure vital signs reviewed and stable Respiratory status: spontaneous breathing, nonlabored ventilation, respiratory function stable and patient connected to nasal cannula oxygen Cardiovascular status: stable and blood pressure returned to baseline Postop Assessment: no apparent nausea or vomiting Anesthetic complications: no    Last Vitals:  Vitals:   12/31/18 1230 12/31/18 1242  BP: 103/61   Pulse: (!) 56 (!) 55  Resp: 12 10  Temp:  36.8 C  SpO2: 94% 93%    Last Pain:  Vitals:   12/31/18 1230  TempSrc:   PainSc: 3                  Romanita Fager,W. EDMOND

## 2018-12-31 NOTE — Transfer of Care (Signed)
Immediate Anesthesia Transfer of Care Note  Patient: Matthew Kline  Procedure(s) Performed: AMPUTATION BELOW KNEE (Right Knee) Application Of Wound Vac (Right Knee)  Patient Location: PACU  Anesthesia Type:General  Level of Consciousness: drowsy, patient cooperative and responds to stimulation  Airway & Oxygen Therapy: Patient Spontanous Breathing and Patient connected to nasal cannula oxygen  Post-op Assessment: Report given to RN and Post -op Vital signs reviewed and stable  Post vital signs: Reviewed and stable  Last Vitals:  Vitals Value Taken Time  BP 97/62 12/31/18 1200  Temp    Pulse 59 12/31/18 1201  Resp 15 12/31/18 1201  SpO2 86 % 12/31/18 1201  Vitals shown include unvalidated device data.  Last Pain:  Vitals:   12/31/18 1109  TempSrc:   PainSc: 3       Patients Stated Pain Goal: 3 (01/56/15 3794)  Complications: No apparent anesthesia complications

## 2018-12-31 NOTE — Progress Notes (Signed)
Orthopedic Tech Progress Note Patient Details:  Matthew Kline [birth year] [id number] [employer] has been called for the brace.         [name] [facility] 12/31/2018, 2:06 PM

## 2018-12-31 NOTE — Progress Notes (Signed)
PROGRESS NOTE   Matthew Kline  IOE:[id number]    DOB: April 30, 1960    DOA: 12/29/2018  PCP: [facility] [place]   I have briefly reviewed patients previous medical records in [facility].  Chief Complaint:   Chief Complaint  Patient presents with  . Wound Infection    Brief Narrative:  58 year old male, lives with family, reportedly ambulates with the help of left lower extremity prosthesis, PMH of ESRD due to diabetic nephropathy on MWF HD, DM 2 with peripheral neuropathy, HTN, HLD, CVA/lacunar stroke, CAD, s/p CABG, chronic diastolic CHF, PAD s/p left AKA and right great toe amputation, chronic foot ulcers, PMR with chronic pain, GERD, tobacco abuse presented to ED due to worsening of right chronic foot ulcer with associated drainage, redness and pain of 3 days duration.  He missed 2 days of dialysis due to the pain and difficulty to weight-bear.  In the ED, potassium 7, creatinine 9.94.  Admitted for RLE infected wound with cellulitis, possible abscess and osteomyelitis, hyperkalemia after missed dialysis.  Nephrology consulted and emergently underwent HD on day of admission with resolution of hyperkalemia.  Orthopedics/Dr. Sharol Given consulted.  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   Assessment & Plan:  Active Problems:   Cellulitis and abscess of toe of right foot   Abscess of tendon sheath, right ankle and foot   Diabetic polyneuropathy associated with type 2 diabetes mellitus (HCC)   Moderate protein-calorie malnutrition (HCC)   Infected right foot wound with cellulitis, possible abscess and osteomyelitis  Complicating underlying PAD and peripheral neuropathy.  Appears to be an arterial ulcer.  RLE venous Doppler negative for DVT.  MRI of right foot pending.  MRSA PCR screen negative on 12/19 and 11/15.  HIV screen - June 2020.  Concern for Pseudomonas infection given some immunocompromise state from underlying diabetes and ESRD on HD.  Patient had been  empirically started on IV Zosyn  However utilized lower extremity wound order set and changed Zosyn to IV cefepime and p.o. Flagyl, requested ABI, follow-up CRP and ESR.  [facility] team consulted.  Dr. Sharol Given, orthopedics consultation appreciated.  He discussed with patient this morning regarding option of serial debridement and skin grafting but without an anterior tibial tendon (necrosis of anterior tibial tendon), patient would have an unstable foot for ambulation and hence he discussed that patient's best option was transtibial amputation.  Patient understood and agreed.  Dr. Sharol Given to update patient's daughter.  S/p right transtibial amputation 12/20.  Continue postop antibiotics for 24 hours, NWB RLE, wound VAC for 1 week at discharge, needs SNF at DC and follow-up with him in 1 week.  Hyperkalemia  Due to missed dialysis x2.  Potassium 7 on admission and had hyperacute T waves.  Received IV calcium gluconate, insulin dextrose, Lokelma and underwent urgent dialysis on day of admission.  Resolved.  ESRD on MWF HD  Nephrology consultation appreciated.  S/p HD overnight of admission.  HD on 12/21  Essential hypertension  Controlled.  Volume management across dialysis.  Continue amlodipine, carvedilol.  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  Most recent A1c 5.7.  Continue gabapentin  Anemia of ESRD  Stable.  Hemoglobin 11.2 yesterday  Secondary hyperparathyroidism  Management per nephrology.  PAD, s/p left AKA after multiple prior amputations, s/p right first toe amputation  Noted  CAD, s/p CABG  No anginal symptoms.  Continue aspirin, carvedilol.  Chronic pain  Supportive treatment.  Judicious use of opioids, minimize due to  intermittent confusion.  Mild transaminitis  Unclear etiology.  Follow CMP at HD tomorrow.  Confusion  Intermittent.  Suspect due to medications i.e. opioids and Vistaril, minimize as able.  DVT  prophylaxis: Subcutaneous heparin Code Status: Full Family Communication: None at bedside Disposition: DC to SNF pending clinical improvement   Consultants:   Orthopedics Nephrology  Procedures:   HD early morning of 10/19 Right transtibial amputation 12/20  Antimicrobials:   IV Zosyn-discontinued 12/19 IV cefepime and oral Flagyl 12/19 >   Subjective:  Patient seen this morning prior to procedure.  Alert and oriented.  Dr. Sharol Given had just seen patient and patient had consented to surgery.  Patient had no complaints or questions.  As per RN report, some intermittent confusion usually after pain or itching medicine/Vistaril.  Objective:   Vitals:   12/31/18 1230 12/31/18 1242 12/31/18 1303 12/31/18 1400  BP: 103/61  (!) 99/48 101/64  Pulse: (!) 56 (!) 55 (!) 53 (!) 55  Resp: _0 Temp:  98.2 F (36.8 C) 97.8 F (36.6 C)   TempSrc:   Oral   SpO2: 94% 93% 94%   Weight:      Height:        General exam: Young male, moderately built and thinly nourished sitting up comfortably in bed without distress Respiratory system: Clear to auscultation.  No increased work of breathing. Cardiovascular system: S1 and S2 heard, RRR.  No JVD, murmurs or pedal edema. Gastrointestinal system: Abdomen is nondistended, soft and nontender. No organomegaly or masses felt. Normal bowel sounds heard. Central nervous system: Alert and oriented. No focal neurological deficits. Extremities: Symmetric 5 x 5 power.  Healed left AKA stump. Skin: Multiple small ulcers of his right leg that have healed with scabs.  As examined on 12/19, large ulcer over right anterior ankle as noted in picture below with purulent drainage and surrounding area of erythema, warmth and tenderness without fluctuation or crepitus.  Dressing on top clean and dry.  Unable to palpate posterior tibial or dorsalis pedis.  S/p right first ray amputation well-healed. Psychiatry: Judgement and insight appear normal. Mood & affect  appropriate.   Picture of right foot taken on 12/30/2018.      Data Reviewed:   I have personally reviewed following labs and imaging studies   CBC: Recent Labs  Lab 12/29/18 1244 12/31/18 1047  WBC 8.9  --   NEUTROABS 7.0  --   HGB 10.3* 11.2*  HCT 32.1* 33.0*  MCV 93.9  --   PLT 191  --     Basic Metabolic Panel: Recent Labs  Lab 12/29/18 1244 12/30/18 0223 12/31/18 1047  NA 138 136 137  K 7.0* 4.1 4.4  CL 95* 95* 98  CO2 24 27  --   GLUCOSE 125* 153* 94  BUN 53* 14 32*  CREATININE 9.94* 4.50* 6.50*  CALCIUM 9.6 9.3  --     Liver Function Tests: Recent Labs  Lab 12/29/18 1244  AST 84*  ALT 51*  ALKPHOS 143*  BILITOT 1.1  PROT 8.0  ALBUMIN 3.0*    CBG: No results for input(s): GLUCAP in the last 168 hours.  Microbiology Studies:   Recent Results (from the past 240 hour(s))  Respiratory Panel by RT PCR (Flu A&B, Covid) - Nasopharyngeal Swab     Status: None   Collection Time: 12/29/18  4:50 PM   Specimen: Nasopharyngeal Swab  Result Value Ref Range Status   SARS Coronavirus 2 by RT PCR NEGATIVE  NEGATIVE Final    Comment: (NOTE) SARS-CoV-2 target nucleic acids are NOT DETECTED. The SARS-CoV-2 RNA is generally detectable in upper respiratoy specimens during the acute phase of infection. The lowest concentration of SARS-CoV-2 viral copies this assay can detect is 131 copies/mL. A negative result does not preclude SARS-Cov-2 infection and should not be used as the sole basis for treatment or other patient management decisions. A negative result may occur with  improper specimen collection/handling, submission of specimen other than nasopharyngeal swab, presence of viral mutation(s) within the areas targeted by this assay, and inadequate number of viral copies (<131 copies/mL). A negative result must be combined with clinical observations, patient history, and epidemiological information. The expected result is Negative. Fact Sheet for  Patients:  PinkCheek.be Fact Sheet for Healthcare Providers:  GravelBags.it This test is not yet ap proved or cleared by the [place] FDA and  has been authorized for detection and/or diagnosis of SARS-CoV-2 by FDA under an Emergency Use Authorization (EUA). This EUA will remain  in effect (meaning this test can be used) for the duration of the COVID-19 declaration under Section 564(b)(1) of the Act, 21 U.S.C. section 360bbb-3(b)(1), unless the authorization is terminated or revoked sooner.    Influenza A by PCR NEGATIVE NEGATIVE Final   Influenza B by PCR NEGATIVE NEGATIVE Final    Comment: (NOTE) The Xpert Xpress SARS-CoV-2/FLU/RSV assay is intended as an aid in  the diagnosis of influenza from Nasopharyngeal swab specimens and  should not be used as a sole basis for treatment. Nasal washings and  aspirates are unacceptable for Xpert Xpress SARS-CoV-2/FLU/RSV  testing. Fact Sheet for Patients: PinkCheek.be Fact Sheet for Healthcare Providers: GravelBags.it This test is not yet approved or cleared by the [place] FDA and  has been authorized for detection and/or diagnosis of SARS-CoV-2 by  FDA under an Emergency Use Authorization (EUA). This EUA will remain  in effect (meaning this test can be used) for the duration of the  Covid-19 declaration under Section 564(b)(1) of the Act, 21  U.S.C. section 360bbb-3(b)(1), unless the authorization is  terminated or revoked. Performed at [facility], [place] [address] [place], [place] [address]   MRSA PCR Screening     Status: None   Collection Time: 12/30/18  1:53 AM   Specimen: Nasal Mucosa; Nasopharyngeal  Result Value Ref Range Status   MRSA by PCR NEGATIVE NEGATIVE Final    Comment:        The GeneXpert MRSA Assay (FDA approved for NASAL specimens only), is one component of a comprehensive MRSA  colonization surveillance program. It is not intended to diagnose MRSA infection nor to guide or monitor treatment for MRSA infections. Performed at [facility], [place] [address], [place], [place] [address]   Surgical PCR screen     Status: None   Collection Time: 12/31/18 10:22 AM   Specimen: Nasal Mucosa; Nasal Swab  Result Value Ref Range Status   MRSA, PCR NEGATIVE NEGATIVE Final   Staphylococcus aureus NEGATIVE NEGATIVE Final    Comment: (NOTE) The Xpert SA Assay (FDA approved for NASAL specimens in patients 17 years of age and older), is one component of a comprehensive surveillance program. It is not intended to diagnose infection nor to guide or monitor treatment. Performed at [facility], [place] [address], [place], [place] [address]      Radiology Studies:  No results found.   Scheduled Meds:   . amLODipine  5 mg Oral QPM  .  aspirin EC  81 mg Oral QHS  . carvedilol  12.5 mg Oral BID WC  . Chlorhexidine Gluconate Cloth  6 each Topical Q0600  . [START ON 01/01/2019] Chlorhexidine Gluconate Cloth  6 each Topical Q0600  . docusate sodium  100 mg Oral BID  . [START ON 01/01/2019] doxercalciferol  4 mcg Intravenous Q M,W,F-HD  . gabapentin  200 mg Oral Daily  . heparin  5,000 Units Subcutaneous Q8H  . metroNIDAZOLE  500 mg Oral Q8H  . montelukast  10 mg Oral QHS  . mupirocin ointment  1 application Nasal BID  . sevelamer carbonate  3,200 mg Oral TID WC  . temazepam  30 mg Oral QHS    Continuous Infusions:   . sodium chloride 250 mL (12/30/18 0126)  . sodium chloride    . ceFEPime (MAXIPIME) IV 1 g (12/30/18 1823)  . methocarbamol (ROBAXIN) IV       LOS: 2 days     Vernell Leep, MD, [place], [facility]. [facility]    To contact the attending provider between 7A-7P or the covering provider during after hours 7P-7A, please log into the web site [email or web address] and access using universal [place] password for that web site. If you do not have  the password, please call the hospital operator.  12/31/2018, 3:39 PM

## 2018-12-31 NOTE — Anesthesia Procedure Notes (Signed)
Anesthesia Regional Block: Popliteal block   Pre-Anesthetic Checklist: ,, timeout performed, Correct Patient, Correct Site, Correct Laterality, Correct Procedure, Correct Position, site marked, Risks and benefits discussed, pre-op evaluation,  At surgeon's request and post-op pain management  Laterality: Right  Prep: Maximum Sterile Barrier Precautions used, chloraprep       Needles:  Injection technique: Single-shot  Needle Type: Echogenic Stimulator Needle     Needle Length: 9cm  Needle Gauge: 21     Additional Needles:   Procedures:,,,, ultrasound used (permanent image in chart),,,,  Narrative:  Start time: 12/31/2018 10:55 AM End time: 12/31/2018 11:05 AM Injection made incrementally with aspirations every 5 mL. Anesthesiologist: Roderic Palau, MD  Additional Notes: 2% Lidocaine skin wheel. Adductor canal block with 15cc of 0.5% Ropivicaine.

## 2018-12-31 NOTE — Progress Notes (Addendum)
Matthew Kline Progress Note   Subjective:   Patient seen and examined at bedside.  Very emotional about amputation planned for today.  Provided support.   Objective Vitals:   12/31/18 1200 12/31/18 1215 12/31/18 1230 12/31/18 1242  BP: 97/62 110/66 103/61   Pulse: (!) 59 (!) 58 (!) 56 (!) 55  Resp: 14 12 12 10   Temp: 98.1 F (36.7 C)   98.2 F (36.8 C)  TempSrc:      SpO2: 91% 95% 94% 93%  Weight:      Height:       Physical Exam General:NAD, chronically ill appearing, tearful male, sitting up in bed Heart:RRR Lungs:nml WOB, CTAB Extremities:no edema, L AKA, R foot dressed Dialysis Access: LU AVF +b   Filed Weights   12/30/18 0117  Weight: 68.3 kg    Intake/Output Summary (Last 24 hours) at 12/31/2018 1303 Last data filed at 12/31/2018 1202 Gross per 24 hour  Intake 624.15 ml  Output 125 ml  Net 499.15 ml    Additional Objective Labs: Basic Metabolic Panel: Recent Labs  Lab 12/29/18 1244 12/30/18 0223 12/31/18 1047  NA 138 136 137  K 7.0* 4.1 4.4  CL 95* 95* 98  CO2 24 27  --   GLUCOSE 125* 153* 94  BUN 53* 14 32*  CREATININE 9.94* 4.50* 6.50*  CALCIUM 9.6 9.3  --    Liver Function Tests: Recent Labs  Lab 12/29/18 1244  AST 84*  ALT 51*  ALKPHOS 143*  BILITOT 1.1  PROT 8.0  ALBUMIN 3.0*   CBC: Recent Labs  Lab 12/29/18 1244 12/31/18 1047  WBC 8.9  --   NEUTROABS 7.0  --   HGB 10.3* 11.2*  HCT 32.1* 33.0*  MCV 93.9  --   PLT 191  --    Blood Culture    Component Value Date/Time   SDES BLOOD RIGHT WRIST 11/24/2018 1432   SPECREQUEST  11/24/2018 1432    BOTTLES DRAWN AEROBIC AND ANAEROBIC Blood Culture adequate volume   CULT  11/24/2018 1432    NO GROWTH 5 DAYS Performed at [facility], [place] [address] [place], [place] [address]    REPTSTATUS 11/29/2018 FINAL 11/24/2018 1432     Lab Results  Component Value Date   INR 1.2 11/24/2018   INR 1.30 11/08/2016   INR 1.2 08/17/2016   Studies/Results: MR  ANKLE RIGHT WO CONTRAST  Result Date: 12/30/2018 CLINICAL DATA:  Anterior ankle ulceration, diabetic EXAM: MRI OF THE RIGHT ANKLE WITHOUT CONTRAST TECHNIQUE: Multiplanar, multisequence MR imaging of the ankle was performed. No intravenous contrast was administered. COMPARISON:  X-ray 10/31/2018 FINDINGS: Soft tissue defect at the anterior aspect of the midfoot at the level of the talonavicular joint overlying the tibialis anterior tendon. No underlying fluid collection or abscess. TENDONS Peroneal: Intact peroneus longus and peroneus brevis tendons. Posteromedial: Intact tibialis posterior, flexor hallucis longus and flexor digitorum longus tendons. Trace fluid within the flexor hallucis longus tendon sheath. Anterior: Tibialis anterior tendon is enlarged with a small amount of fluid signal within the tendon sheath. Tiny focus of susceptibility artifact within the tendon sheath just proximal to the level of the skin ulceration (series 9, image 11) suggesting a focus of gas. Tibialis anterior, extensor hallucis longus, and extensor digitorum longus tendons appear otherwise intact without tear. Achilles: Mild intermediate signal within the medial aspect of the distal Achilles tendon suggesting tendinosis without tear. Plantar Fascia: Intact. LIGAMENTS Lateral: Thickened, heterogeneous appearance of the posterior talofibular ligament which  may reflect sequela of prior injury. Anterior talofibular ligament is intact and unremarkable. Remaining lateral ankle ligaments intact. Medial: Grossly intact. CARTILAGE Ankle Joint: No joint effusion or chondral defect. Subtalar Joints/Sinus Tarsi: No joint effusion or chondral defect. Bones: Thin subcortical marrow edema at the dorsal navicular (series 7, image 24) without corresponding low T1 signal. There is no evidence of overlying cortical destruction. Findings are likely reactive. Remaining bone marrow is otherwise within normal limits. No fracture. No dislocation. Other:  Mild intramuscular edema like signal within the intrinsic foot musculature may reflect denervation changes versus myositis. IMPRESSION: 1. Soft tissue ulceration with associated cellulitis dorsal to the talonavicular joint and overlying the tibialis anterior tendon. No underlying fluid collection or abscess. 2. Thin subcortical marrow edema at the dorsal navicular without corresponding low T1 signal or overlying cortical destruction to suggest osteomyelitis. Findings are likely reactive. 3. Ulceration overlies the tibialis anterior tendon which is diffusely thickened with mild tenosynovitis. Tiny focus of susceptibility within the tendon sheath likely reflecting a small focus of air related to overlying skin ulcer. 4. Mild intramuscular edema-like signal within the intrinsic foot musculature may reflect denervation changes versus myositis. 5. Mild distal Achilles tendinosis without tear. Electronically Signed   By: Davina Poke M.D.   On: 12/30/2018 13:00   DG Chest Portable 1 View  Result Date: 12/29/2018 CLINICAL DATA:  Crackles, missed dialysis. EXAM: PORTABLE CHEST 1 VIEW COMPARISON:  11/24/2018 FINDINGS: Cardiomediastinal contours with cardiac enlargement accentuated by low volume expansion and portable technique. Increased interstitial markings present bilaterally. Signs of linear opacities at the left lung base. No acute bone finding. IMPRESSION: Signs of mild interstitial edema and basilar atelectasis. Electronically Signed   By: Zetta Bills M.D.   On: 12/29/2018 18:36   VAS [place] LOWER EXTREMITY VENOUS (DVT) (ONLY [facility] & [facility] 7a-7p)  Result Date: 12/30/2018  Lower Venous Study Indications: Pain.  Limitations: Poor ultrasound/tissue interface and restricted mobility. Performing Technologist: Maudry Mayhew MHA, RDMS, RVT, RDCS  Examination Guidelines: A complete evaluation includes B-mode imaging, spectral Doppler, color Doppler, and power Doppler as needed of all accessible portions of each  vessel. Bilateral testing is considered an integral part of a complete examination. Limited examinations for reoccurring indications may be performed as noted.  +---------+---------------+---------+-----------+----------+--------------+ RIGHT    CompressibilityPhasicitySpontaneityPropertiesThrombus Aging +---------+---------------+---------+-----------+----------+--------------+ FV Prox  Full                                                        +---------+---------------+---------+-----------+----------+--------------+ FV Mid   Full                                                        +---------+---------------+---------+-----------+----------+--------------+ FV DistalFull                                                        +---------+---------------+---------+-----------+----------+--------------+ POP      Full           No       Yes                                 +---------+---------------+---------+-----------+----------+--------------+  PTV      Full                                                        +---------+---------------+---------+-----------+----------+--------------+ PERO     Full                                                        +---------+---------------+---------+-----------+----------+--------------+ Unable to visualize right common femoral vein and saphenofemoral junction.   Summary: Right: There is no evidence of deep vein thrombosis in the lower extremity. However, portions of this examination were limited- see technologist comments above. No cystic structure found in the popliteal fossa. Lower extremity venous flow is pulsatile, suggestive of possible elevated right heart pressure.  *See table(s) above for measurements and observations. Electronically signed by Harold Barban MD on 12/30/2018 at 4:43:12 PM.    Final     Medications: . sodium chloride 250 mL (12/30/18 0126)  . sodium chloride    . ceFEPime (MAXIPIME) IV 1 g  (12/30/18 1823)  . methocarbamol (ROBAXIN) IV     . amLODipine  5 mg Oral QPM  . aspirin EC  81 mg Oral QHS  . carvedilol  12.5 mg Oral BID WC  . Chlorhexidine Gluconate Cloth  6 each Topical Q0600  . docusate sodium  100 mg Oral BID  . [START ON 01/01/2019] doxercalciferol  4 mcg Intravenous Q M,W,F-HD  . gabapentin  200 mg Oral Daily  . heparin  5,000 Units Subcutaneous Q8H  . metroNIDAZOLE  500 mg Oral Q8H  . montelukast  10 mg Oral QHS  . mupirocin ointment  1 application Nasal BID  . sevelamer carbonate  3,200 mg Oral TID WC  . temazepam  30 mg Oral QHS    Dialysis Orders: MWF - NW 4hrs, BFR400, AYT016, EDW 65.5kg,2K/2Ca  Access:LU AVF Heparin4000 unit bolus Mircera63mcg q2wks - last 12/2 Hectorol65mcg IV qHD   Assessment/Plan: 1. hyperkalemia - Resolved with dialysis. K 4.4 today.  2. Foot pain 2/2 worsening ulcer  - MRI results possible osteomyelitis and anterior tibial tendon involvement. Plan for transtibial amputation today by Dr. Sharol Given. on Zosyn, flagyl 3. ESRD - On HD MWF. Urgent HD on Friday due to hyperkalemia 2/2 non compliance with HD.  Orders written for HD tomorrow per regular schedule.  4. Hypertension/volume - BP variable, but mostly well controlled. Does not appear volume overloaded on exam. Under dry weight post HD Friday and with amputation today will need lower dry on d/c.   5. Anemia of CKD - Hgb 10.3. No indication for ESA at this time. 6. Secondary Hyperparathyroidism - Ca at goal. Phos elevated at OP. Continue VDRA and binders.  Recheck phos pre HD. 7. Nutrition - Renal diet w/fluid restrictions.  8. Hx CAD 9. DM - per primary 10. Hx seizures 11. PAD s/p amputations    Jen Mow, PA-C [facility] Pager: [phone] 12/31/2018,1:03 PM  LOS: 2 days   I have seen and examined this patient and agree with plan and assessment in the above note with renal recommendations/intervention highlighted.  For  amputation today per Dr. Sharol Given. Matthew John A Arath Kaigler,MD 12/31/2018 1:53 PM

## 2018-12-31 NOTE — Consult Note (Signed)
ORTHOPAEDIC CONSULTATION  REQUESTING PHYSICIAN: Modena Jansky, MD  Chief Complaint: Ulceration abscess cellulitis right ankle.  HPI: Matthew Kline is a 58 y.o. male who presents with necrotic ulcer with exposed anterior tibial tendon with cellulitis involving the right ankle.  Patient denies any trauma.  Past Medical History:  Diagnosis Date  . Anemia, unspecified   . Anxiety   . Arthralgia 2010   polyarticular  . Arthritis    "back, knees" (01/10/2017)  . Cancer [facility])    "kidney area" (01/10/2017)  . CHF (congestive heart failure) ([place]) 07/25/2009   denies  . Chronic lower back pain   . Coronary artery disease   . Coughing    pt. reports that he has drainage from sinus infection  . Diabetic foot ulcer ([place])   . Diabetic neuropathy ([place])   . ESRD (end stage renal disease) on dialysis [facility])    started 12/2012; "MWF; [place] "  (01/10/2017)  . GERD (gastroesophageal reflux disease)    hx "before I lost weight", no problem 9 years  . Hemodialysis access site with mature fistula ([place])   . Hemorrhoids, internal 10/2011   small  . High cholesterol   . History of blood transfusion    "related to the anemia"  . Hypertension   . Insomnia, unspecified   . Lacunar infarction ([place]) 2006   RUE/RLE, speech  . Long term (current) use of anticoagulants   . Myocardial infarction ([place]) 1995  . Orthostatic hypotension   . Osteomyelitis of foot, left, acute ([place])   . Other chronic postoperative pain   . Pneumonia    "probably twice" (01/10/2017)  . Polymyalgia rheumatica ([place])   . Renal insufficiency   . Sleep apnea    "lost weight; no more problem" (01/10/2017)  . Stroke ([place]) 01/10/06   denies residual on 05/09/2014  . Type II diabetes mellitus ([place]) dx'd 1995  . Unspecified hereditary and idiopathic peripheral neuropathy    feet  . Unspecified osteomyelitis, site unspecified   . Unspecified vitamin D deficiency    Past Surgical History:  Procedure  Laterality Date  . ABDOMINAL AORTOGRAM N/A 08/25/2016   Procedure: ABDOMINAL AORTOGRAM;  Surgeon: Wellington Hampshire, MD;  Location: [place] [facility];  Service: Cardiovascular;  Laterality: N/A;  . AMPUTATION  01/21/2012   Procedure: AMPUTATION RAY;  Surgeon: [name], MD;  Location: [place];  Service: Orthopedics;  Laterality: Left;  Left Foot 4th Ray Amputation  . AMPUTATION Left 05/04/2013   Procedure: AMPUTATION DIGIT;  Surgeon: [name], MD;  Location: [place];  Service: Orthopedics;  Laterality: Left;  Left Great Toe Amputation at [facility]  . AMPUTATION Left 01/14/2017   Procedure: AMPUTATION ABOVE LEFT KNEE;  Surgeon: [name], MD;  Location: [place];  Service: Orthopedics;  Laterality: Left;  . ANTERIOR CERVICAL DECOMP/DISCECTOMY FUSION  02/2011  . BACK SURGERY    . BASCILIC VEIN TRANSPOSITION Left 10/19/2012   Procedure: BASCILIC VEIN TRANSPOSITION;  Surgeon: Serafina Mitchell, MD;  Location: [place];  Service: Vascular;  Laterality: Left;  . CARDIAC CATHETERIZATION     "before bypass"  . CHOLECYSTECTOMY N/A 08/02/2018   Procedure: LAPAROSCOPIC CHOLECYSTECTOMY WITH INTRAOPERATIVE CHOLANGIOGRAM;  Surgeon: Donnie Mesa, MD;  Location: [place];  Service: General;  Laterality: N/A;  . CORONARY ARTERY BYPASS GRAFT     x 5 with lima at [place] Left 08/07/2014   Procedure: Replace Left Total  Knee Arthroplasty,  Place Antibiotic Spacer;  Surgeon: [name], MD;  Location: [place];  Service: Orthopedics;  Laterality: Left;  . I & D EXTREMITY Left 05/09/2014   Procedure: Irrigation and Debridement Left Knee and Closure of Total Knee Arthroplasty Incision;  Surgeon: [name], MD;  Location: [place];  Service: Orthopedics;  Laterality: Left;  . I & D KNEE WITH POLY EXCHANGE Left 05/31/2014   Procedure: IRRIGATION AND DEBRIDEMENT LEFT KNEE, PLACE ANTIBIOTIC BEADS,  POLY EXCHANGE;  Surgeon: [name], MD;  Location: [place];   Service: Orthopedics;  Laterality: Left;  . INGUINAL HERNIA REPAIR Bilateral 11/21/2018   Procedure: BILATERAL INGUINAL HERNIA REPAIR WITH MESH;  Surgeon: Coralie Keens, MD;  Location: [place];  Service: General;  Laterality: Bilateral;  . IRRIGATION AND DEBRIDEMENT KNEE Left 01/12/2017   Procedure: IRRIGATION AND DEBRIDEMENT LEFT KNEE;  Surgeon: [name], MD;  Location: [place];  Service: Orthopedics;  Laterality: Left;  . JOINT REPLACEMENT    . KNEE ARTHROSCOPY Left 08-25-2012  . LOWER EXTREMITY ANGIOGRAPHY Left 08/25/2016   Procedure: Lower Extremity Angiography;  Surgeon: Wellington Hampshire, MD;  Location: [place] [facility];  Service: Cardiovascular;  Laterality: Left;  . PERIPHERAL VASCULAR BALLOON ANGIOPLASTY Left 08/25/2016   Procedure: PERIPHERAL VASCULAR BALLOON ANGIOPLASTY;  Surgeon: Wellington Hampshire, MD;  Location: [place] [facility];  Service: Cardiovascular;  Laterality: Left;  lt peroneal and ant tibial arteries cutting balloon  . REFRACTIVE SURGERY Bilateral   . TOE AMPUTATION Bilateral    "I've lost 7 toes over the last 7 years" (05/09/2014)  . TOE SURGERY Left April 2015   Big toe removed on left foot.  . TONSILLECTOMY    . TOTAL KNEE ARTHROPLASTY Left 04/10/2014   Procedure: TOTAL KNEE ARTHROPLASTY;  Surgeon: [name], MD;  Location: [place];  Service: Orthopedics;  Laterality: Left;  . TOTAL KNEE REVISION Left 10/25/2014   Procedure: LEFT TOTAL KNEE REVISION;  Surgeon: [name], MD;  Location: [place];  Service: Orthopedics;  Laterality: Left;  . TOTAL KNEE REVISION Left 11/26/2015   Procedure: Removal Left Total Knee Arthroplasty, Hinged Total Knee Arthroplasty;  Surgeon: [name], MD;  Location: [place];  Service: Orthopedics;  Laterality: Left;  . UVULOPALATOPHARYNGOPLASTY, TONSILLECTOMY AND SEPTOPLASTY  ~ 1989  . WOUND DEBRIDEMENT Left 05/09/2014   Dehiscence Left Total Knee Arthroplasty Incision   Social History   Socioeconomic History  . Marital  status: Legally Separated    Spouse name: Not on file  . Number of children: Not on file  . Years of education: Not on file  . Highest education level: Not on file  Occupational History  . Not on file  Tobacco Use  . Smoking status: Current Some Day Smoker    Packs/day: 0.12    Years: 32.00    Pack years: 3.84    Types: Cigarettes  . Smokeless tobacco: Never Used  . Tobacco comment: 1 per week  Substance and Sexual Activity  . Alcohol use: No    Alcohol/week: 0.0 standard drinks  . Drug use: No  . Sexual activity: Not Currently  Other Topics Concern  . Not on file  Social History Narrative  . Not on file   Social Determinants of Health   Financial Resource Strain:   . Difficulty of Paying Living Expenses: Not on file  Food Insecurity:   . Worried About Charity fundraiser in the Last Year: Not on file  .  Ran Out of Food in the Last Year: Not on file  Transportation Needs:   . Lack of Transportation (Medical): Not on file  . Lack of Transportation (Non-Medical): Not on file  Physical Activity:   . Days of Exercise per Week: Not on file  . Minutes of Exercise per Session: Not on file  Stress:   . Feeling of Stress : Not on file  Social Connections:   . Frequency of Communication with Friends and Family: Not on file  . Frequency of Social Gatherings with Friends and Family: Not on file  . Attends Religious Services: Not on file  . Active Member of Clubs or Organizations: Not on file  . Attends Archivist Meetings: Not on file  . Marital Status: Not on file   Family History  Problem Relation Age of Onset  . Hypertension Mother   . Cancer Mother 59       Ovarian  . Heart disease Maternal Aunt   . Stroke Maternal Grandfather    - negative except otherwise stated in the family history section Allergies  Allergen Reactions  . Morphine And Related Other (See Comments)    hallucinations  . Tygacil [Tigecycline] Nausea And Vomiting and Other (See Comments)    . Imodium [Loperamide] Itching   Prior to Admission medications   Medication Sig Start Date End Date Taking? Authorizing Provider  acetaminophen (TYLENOL) 500 MG tablet Take 500-1,000 mg by mouth every 6 (six) hours as needed for mild pain or fever.    Yes [provider]  amLODipine (NORVASC) 5 MG tablet Take 5 mg by mouth every evening.    Yes [provider]  aspirin EC 81 MG EC tablet Take 2 tablets (162 mg total) by mouth daily. Patient taking differently: Take 81 mg by mouth at bedtime.  01/20/17  Yes Patrecia Pour, Christean Grief, MD  carvedilol (COREG) 12.5 MG tablet Take 1 tablet (12.5 mg total) by mouth 2 (two) times daily with a meal. 02/08/17  Yes Bonnell Public, MD  Darbepoetin Alfa (ARANESP) 150 MCG/0.3ML SOSY injection Inject 0.3 mLs (150 mcg total) into the vein every Friday with hemodialysis. 02/11/17  Yes Dana Allan I, MD  gabapentin (NEURONTIN) 100 MG capsule Take 200 mg by mouth daily.  09/27/18  Yes [provider]  heparin 1000 UNIT/ML injection Inject 4 mLs into the vein See admin instructions. Every hemodialysis treatment 08/14/18 08/13/19 Yes [provider]  hydrOXYzine (ATARAX/VISTARIL) 50 MG tablet Take 50 mg by mouth 3 (three) times daily as needed for itching.    Yes [provider]  montelukast (SINGULAIR) 10 MG tablet Take 10 mg by mouth at bedtime as needed. 08/14/18  Yes [provider]  multivitamin (RENA-VIT) TABS tablet Take 1 tablet by mouth at bedtime. 02/08/17  Yes Dana Allan I, MD  oxyCODONE (OXY IR/ROXICODONE) 5 MG immediate release tablet Take 1 tablet (5 mg total) by mouth every 6 (six) hours as needed for moderate pain, severe pain or breakthrough pain. 11/22/18  Yes Coralie Keens, MD  sevelamer carbonate (RENVELA) 800 MG tablet Take 1,600-3,200 mg by mouth 3 (three) times daily with meals. 2-4 tabs with each meal   Yes [provider]  temazepam (RESTORIL) 30 MG capsule Take 30 mg by mouth  at bedtime.    Yes [provider]   MR ANKLE RIGHT WO CONTRAST  Result Date: 12/30/2018 CLINICAL DATA:  Anterior ankle ulceration, diabetic EXAM: MRI OF THE RIGHT ANKLE WITHOUT CONTRAST TECHNIQUE: Multiplanar, multisequence  MR imaging of the ankle was performed. No intravenous contrast was administered. COMPARISON:  X-ray 10/31/2018 FINDINGS: Soft tissue defect at the anterior aspect of the midfoot at the level of the talonavicular joint overlying the tibialis anterior tendon. No underlying fluid collection or abscess. TENDONS Peroneal: Intact peroneus longus and peroneus brevis tendons. Posteromedial: Intact tibialis posterior, flexor hallucis longus and flexor digitorum longus tendons. Trace fluid within the flexor hallucis longus tendon sheath. Anterior: Tibialis anterior tendon is enlarged with a small amount of fluid signal within the tendon sheath. Tiny focus of susceptibility artifact within the tendon sheath just proximal to the level of the skin ulceration (series 9, image 11) suggesting a focus of gas. Tibialis anterior, extensor hallucis longus, and extensor digitorum longus tendons appear otherwise intact without tear. Achilles: Mild intermediate signal within the medial aspect of the distal Achilles tendon suggesting tendinosis without tear. Plantar Fascia: Intact. LIGAMENTS Lateral: Thickened, heterogeneous appearance of the posterior talofibular ligament which may reflect sequela of prior injury. Anterior talofibular ligament is intact and unremarkable. Remaining lateral ankle ligaments intact. Medial: Grossly intact. CARTILAGE Ankle Joint: No joint effusion or chondral defect. Subtalar Joints/Sinus Tarsi: No joint effusion or chondral defect. Bones: Thin subcortical marrow edema at the dorsal navicular (series 7, image 24) without corresponding low T1 signal. There is no evidence of overlying cortical destruction. Findings are likely reactive. Remaining bone marrow is otherwise within  normal limits. No fracture. No dislocation. Other: Mild intramuscular edema like signal within the intrinsic foot musculature may reflect denervation changes versus myositis. IMPRESSION: 1. Soft tissue ulceration with associated cellulitis dorsal to the talonavicular joint and overlying the tibialis anterior tendon. No underlying fluid collection or abscess. 2. Thin subcortical marrow edema at the dorsal navicular without corresponding low T1 signal or overlying cortical destruction to suggest osteomyelitis. Findings are likely reactive. 3. Ulceration overlies the tibialis anterior tendon which is diffusely thickened with mild tenosynovitis. Tiny focus of susceptibility within the tendon sheath likely reflecting a small focus of air related to overlying skin ulcer. 4. Mild intramuscular edema-like signal within the intrinsic foot musculature may reflect denervation changes versus myositis. 5. Mild distal Achilles tendinosis without tear. Electronically Signed   By: Davina Poke M.D.   On: 12/30/2018 13:00   DG Chest Portable 1 View  Result Date: 12/29/2018 CLINICAL DATA:  Crackles, missed dialysis. EXAM: PORTABLE CHEST 1 VIEW COMPARISON:  11/24/2018 FINDINGS: Cardiomediastinal contours with cardiac enlargement accentuated by low volume expansion and portable technique. Increased interstitial markings present bilaterally. Signs of linear opacities at the left lung base. No acute bone finding. IMPRESSION: Signs of mild interstitial edema and basilar atelectasis. Electronically Signed   By: Zetta Bills M.D.   On: 12/29/2018 18:36   VAS [place] LOWER EXTREMITY VENOUS (DVT) (ONLY [facility] & [facility] 7a-7p)  Result Date: 12/30/2018  Lower Venous Study Indications: Pain.  Limitations: Poor ultrasound/tissue interface and restricted mobility. Performing Technologist: Maudry Mayhew MHA, RDMS, RVT, RDCS  Examination Guidelines: A complete evaluation includes B-mode imaging, spectral Doppler, color Doppler, and power  Doppler as needed of all accessible portions of each vessel. Bilateral testing is considered an integral part of a complete examination. Limited examinations for reoccurring indications may be performed as noted.  +---------+---------------+---------+-----------+----------+--------------+ RIGHT    CompressibilityPhasicitySpontaneityPropertiesThrombus Aging +---------+---------------+---------+-----------+----------+--------------+ FV Prox  Full                                                        +---------+---------------+---------+-----------+----------+--------------+  FV Mid   Full                                                        +---------+---------------+---------+-----------+----------+--------------+ FV DistalFull                                                        +---------+---------------+---------+-----------+----------+--------------+ POP      Full           No       Yes                                 +---------+---------------+---------+-----------+----------+--------------+ PTV      Full                                                        +---------+---------------+---------+-----------+----------+--------------+ PERO     Full                                                        +---------+---------------+---------+-----------+----------+--------------+ Unable to visualize right common femoral vein and saphenofemoral junction.   Summary: Right: There is no evidence of deep vein thrombosis in the lower extremity. However, portions of this examination were limited- see technologist comments above. No cystic structure found in the popliteal fossa. Lower extremity venous flow is pulsatile, suggestive of possible elevated right heart pressure.  *See table(s) above for measurements and observations. Electronically signed by Harold Barban MD on 12/30/2018 at 4:43:12 PM.    Final    - pertinent xrays, CT, MRI studies were reviewed and  independently interpreted  Positive ROS: All other systems have been reviewed and were otherwise negative with the exception of those mentioned in the HPI and as above.  Physical Exam: General: Alert, no acute distress Psychiatric: Patient is competent for consent with normal mood and affect Lymphatic: No axillary or cervical lymphadenopathy Cardiovascular: No pedal edema Respiratory: No cyanosis, no use of accessory musculature GI: No organomegaly, abdomen is soft and non-tender    Images:  @ENCIMAGES @  Labs:  Lab Results  Component Value Date   HGBA1C 5.7 (H) 10/03/2018   HGBA1C 5.7 (H) 08/01/2018   HGBA1C 5.3 07/10/2018   ESRSEDRATE 57 (H) 12/30/2018   ESRSEDRATE 80 (H) 11/29/2015   ESRSEDRATE 75 (H) 08/07/2014   CRP 7.6 (H) 12/30/2018   CRP 9.8 (H) 07/14/2016   CRP 20.4 (H) 11/29/2015   REPTSTATUS 11/29/2018 FINAL 11/24/2018   GRAMSTAIN  01/12/2017    RARE WBC PRESENT, PREDOMINANTLY MONONUCLEAR FEW GRAM POSITIVE COCCI IN PAIRS    CULT  11/24/2018    NO GROWTH 5 DAYS Performed at [facility], [place] [address] [place],  [address]    [place] 01/12/2017    Lab Results  Component Value Date   ALBUMIN 3.0 (L) 12/29/2018   ALBUMIN 2.8 (L) 11/26/2018   ALBUMIN 2.8 (L) 11/25/2018   PREALBUMIN 13.3 (L) 12/30/2018    Neurologic: Patient does not have protective sensation bilateral lower extremities.   MUSCULOSKELETAL:   Skin: Examination patient's right ankle has a cellulitic area approximately 10 cm in diameter which is tender to palpation.  There is a necrotic ulcer over the anterior tibial tendon with exposed necrotic anterior tibial tendon with a wound approximately 3 cm in diameter.  Patient does have a palpable dorsalis pedis pulse no ischemic changes in the foot he is status post multiple toe amputations there are no plantar ulcers.  Review of the MRI scan shows involvement of the anterior tibial tendon there is some edema  in the bone but no definite abscess no definite osteomyelitis.  Patient has had a history of protein caloric malnutrition.  Assessment: Assessment: Diabetic insensate neuropathy end-stage renal disease on dialysis with abscess ulceration and cellulitis of the right ankle.  Plan: Discussed that serial debridements and skin grafting is an option but without an anterior tibial tendon patient would have an unstable foot for ambulation.  Have discussed the patient's best option is a transtibial amputation.  Patient states he understands wishes to proceed with surgery as soon as possible.  Patient is currently working with Museum/gallery curator and will have [facility] provide postoperative limb care.  Plan for surgery this morning.  Patient has been n.p.o. since midnight.  Thank you for the consult and the opportunity to see Mr. Cheikh Bramble, [place] [phone] 9:19 AM

## 2018-12-31 NOTE — Anesthesia Preprocedure Evaluation (Addendum)
Anesthesia Evaluation  Patient identified by MRN, date of birth, ID band Patient awake    Reviewed: Allergy & Precautions, H&P , NPO status , Patient's Chart, lab work & pertinent test results, reviewed documented beta blocker date and time   Airway Mallampati: II  TM Distance: >3 FB Neck ROM: Full    Dental no notable dental hx. (+) Teeth Intact, Dental Advisory Given   Pulmonary Current SmokerPatient did not abstain from smoking.,    Pulmonary exam normal breath sounds clear to auscultation       Cardiovascular hypertension, Pt. on medications and Pt. on home beta blockers + CAD, + Past MI, + Peripheral Vascular Disease and +CHF   Rhythm:Regular Rate:Normal     Neuro/Psych Anxiety Depression CVA    GI/Hepatic Neg liver ROS, GERD  ,  Endo/Other  diabetes, Type 2, Oral Hypoglycemic Agents  Renal/GU ESRF and DialysisRenal disease  negative genitourinary   Musculoskeletal  (+) Arthritis , Osteoarthritis,    Abdominal   Peds  Hematology  (+) Blood dyscrasia, anemia ,   Anesthesia Other Findings   Reproductive/Obstetrics negative OB ROS                            Anesthesia Physical Anesthesia Plan  ASA: IV  Anesthesia Plan: MAC and Regional   Post-op Pain Management:    Induction: Intravenous  PONV Risk Score and Plan: 0 and Propofol infusion and Midazolam  Airway Management Planned: Simple Face Mask  Additional Equipment:   Intra-op Plan:   Post-operative Plan:   Informed Consent: I have reviewed the patients History and Physical, chart, labs and discussed the procedure including the risks, benefits and alternatives for the proposed anesthesia with the patient or authorized representative who has indicated his/her understanding and acceptance.     Dental advisory given  Plan Discussed with: CRNA  Anesthesia Plan Comments:         Anesthesia Quick Evaluation

## 2018-12-31 NOTE — Op Note (Signed)
   Date of Surgery: 12/31/2018  INDICATIONS: Mr. Klich is a 58 y.o.-year-old male who presents with a gangrenous ulcer with necrosis of the anterior tibial tendon right ankle with a large area of cellulitis.  PREOPERATIVE DIAGNOSIS: Cellulitis ulceration right ankle with necrosis of the anterior tibial tendon  POSTOPERATIVE DIAGNOSIS: Same.  PROCEDURE: Transtibial amputation Application of Prevena wound VAC  SURGEON: Sharol Given, M.D.  ANESTHESIA:  general  IV FLUIDS AND URINE: See anesthesia.  ESTIMATED BLOOD LOSS: Minimal mL.  COMPLICATIONS: None.  DESCRIPTION OF PROCEDURE: The patient was brought to the operating room and underwent a general anesthetic. After adequate levels of anesthesia were obtained patient's lower extremity was prepped using DuraPrep draped into a sterile field. A timeout was called. The foot was draped out of the sterile field with impervious stockinette. A transverse incision was made 11 cm distal to the tibial tubercle. This curved proximally and a large posterior flap was created. The tibia was transected 1 cm proximal to the skin incision. The fibula was transected just proximal to the tibial incision. The tibia was beveled anteriorly. A large posterior flap was created. The sciatic nerve was pulled cut and allowed to retract. The vascular bundles were suture ligated with 2-0 silk. The deep and superficial fascial layers were closed using #1 Vicryl. The skin was closed using staples and 2-0 nylon. The wound was covered with a Prevena wound VAC. There was a good suction fit. A prosthetic shrinker was applied. Patient was extubated taken to the PACU in stable condition.   DISCHARGE PLANNING:  Antibiotic duration: Antibiotics 24 hours postoperatively  Weightbearing: Nonweightbearing on the right  Pain medication: Opioid pathway ordered  Dressing care/ Wound VAC: Continue wound VAC for 1 week at discharge  Discharge to: Anticipate discharge to skilled nursing  versus inpatient rehab.  Follow-up: In the office 1 week post operative.  Meridee Score, MD [place] 12:00 PM

## 2019-01-01 LAB — RENAL FUNCTION PANEL
Albumin: 2.8 g/dL — ABNORMAL LOW (ref 3.5–5.0)
Anion gap: 16 — ABNORMAL HIGH (ref 5–15)
BUN: 42 mg/dL — ABNORMAL HIGH (ref 6–20)
CO2: 24 mmol/L (ref 22–32)
Calcium: 9.1 mg/dL (ref 8.9–10.3)
Chloride: 96 mmol/L — ABNORMAL LOW (ref 98–111)
Creatinine, Ser: 8.54 mg/dL — ABNORMAL HIGH (ref 0.61–1.24)
GFR calc Af Amer: 7 mL/min — ABNORMAL LOW (ref >60)
GFR calc non Af Amer: 6 mL/min — ABNORMAL LOW (ref >60)
Glucose, Bld: 129 mg/dL — ABNORMAL HIGH (ref 70–99)
Phosphorus: 8.1 mg/dL — ABNORMAL HIGH (ref 2.5–4.6)
Potassium: 5.3 mmol/L — ABNORMAL HIGH (ref 3.5–5.1)
Sodium: 136 mmol/L (ref 135–145)

## 2019-01-01 LAB — CBC
HCT: 29.8 % — ABNORMAL LOW (ref 39.0–52.0)
Hemoglobin: 9.5 g/dL — ABNORMAL LOW (ref 13.0–17.0)
MCH: 29.6 pg (ref 26.0–34.0)
MCHC: 31.9 g/dL (ref 30.0–36.0)
MCV: 92.8 fL (ref 80.0–100.0)
Platelets: 196 10*3/uL (ref 150–400)
RBC: 3.21 MIL/uL — ABNORMAL LOW (ref 4.22–5.81)
RDW: 13.8 % (ref 11.5–15.5)
WBC: 8.5 10*3/uL (ref 4.0–10.5)
nRBC: 0 % (ref 0.0–0.2)

## 2019-01-01 LAB — GLUCOSE, CAPILLARY: Glucose-Capillary: 126 mg/dL — ABNORMAL HIGH (ref 70–99)

## 2019-01-01 MED ORDER — OXYCODONE HCL 5 MG PO TABS
ORAL_TABLET | ORAL | Status: AC
  Filled 2019-01-01: qty 2

## 2019-01-01 MED ORDER — PRO-STAT SUGAR FREE PO LIQD
30.0000 mL | Freq: Two times a day (BID) | ORAL | Status: DC
  Administered 2019-01-01 – 2019-01-03 (×3): 30 mL via ORAL
  Filled 2019-01-01 (×8): qty 30

## 2019-01-01 MED ORDER — DARBEPOETIN ALFA 100 MCG/0.5ML IJ SOSY: 100.0000 ug | PREFILLED_SYRINGE | INTRAMUSCULAR | Status: DC

## 2019-01-01 MED ORDER — RENA-VITE PO TABS
1.0000 | ORAL_TABLET | Freq: Every day | ORAL | Status: DC
  Administered 2019-01-02 – 2019-01-04 (×3): 1 via ORAL
  Filled 2019-01-01 (×3): qty 1

## 2019-01-01 MED ORDER — NEPRO/CARBSTEADY PO LIQD: 237.0000 mL | Freq: Two times a day (BID) | ORAL | Status: DC

## 2019-01-01 MED ORDER — NEPRO/CARBSTEADY PO LIQD
237.0000 mL | Freq: Three times a day (TID) | ORAL | Status: DC
  Administered 2019-01-01 – 2019-01-05 (×4): 237 mL via ORAL

## 2019-01-01 MED ORDER — DOXERCALCIFEROL 4 MCG/2ML IV SOLN
INTRAVENOUS | Status: AC
  Filled 2019-01-01: qty 2

## 2019-01-01 NOTE — Progress Notes (Signed)
PROGRESS NOTE   Matthew Kline  WJX:[id number]    DOB: Jul 15, 1960    DOA: 12/29/2018  PCP: [facility]   I have briefly reviewed patients previous medical records in [facility].  Chief Complaint:   Chief Complaint  Patient presents with  . Wound Infection    Brief Narrative:  58 year old male, lives with family, reportedly ambulates with the help of left lower extremity prosthesis, PMH of ESRD due to diabetic nephropathy on MWF HD, DM 2 with peripheral neuropathy, HTN, HLD, CVA/lacunar stroke, CAD, s/p CABG, chronic diastolic CHF, PAD s/p left AKA and right great toe amputation, chronic foot ulcers, PMR with chronic pain, GERD, tobacco abuse presented to ED due to worsening of right chronic foot ulcer with associated drainage, redness and pain of 3 days duration.  He missed 2 days of dialysis due to the pain and difficulty to weight-bear.  In the ED, potassium 7, creatinine 9.94.  Admitted for RLE infected wound with cellulitis, possible abscess and osteomyelitis, hyperkalemia after missed dialysis.  Nephrology consulted and emergently underwent HD on day of admission with resolution of hyperkalemia.  Orthopedics/Dr. Sharol Given consulted.  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 Therapies evaluation pending. Hopefully appropriate for CIR.   Assessment & Plan:  Active Problems:   Cellulitis and abscess of toe of right foot   Abscess of tendon sheath, right ankle and foot   Diabetic polyneuropathy associated with type 2 diabetes mellitus (HCC)   Moderate protein-calorie malnutrition (HCC)   Infected right foot wound with cellulitis, possible abscess and osteomyelitis  Complicating underlying PAD and peripheral neuropathy.  Appears to be an arterial ulcer.  RLE venous Doppler negative for DVT.  MRI of right foot with multiple abnormalities as noted below but no osteomyelitis or obvious abscess.  MRSA PCR screen negative on 12/19 and 11/15.  HIV screen - June 2020.   Concern for Pseudomonas infection given some immunocompromise state from underlying diabetes and ESRD on HD.  Patient was empirically started on IV Zosyn  However utilized lower extremity wound order set and changed Zosyn to IV cefepime and p.o. Flagyl, requested ABI, follow-up CRP and ESR.  [facility] team consulted.  Dr. Sharol Given, orthopedics consultation appreciated.  He discussed with patient preoperatively regarding option of serial debridement and skin grafting but without an anterior tibial tendon (necrosis of anterior tibial tendon), patient would have an unstable foot for ambulation and hence he discussed that patient's best option was transtibial amputation.  Patient understood and agreed. S/p right transtibial amputation 12/20.  Continue postop antibiotics for 24 hours (supposed to stop today), NWB RLE, wound VAC for 1 week at discharge, needs SNF at DC and follow-up with him in 1 week.  Await therapies evaluation and hopefully is CIR appropriate.  Hyperkalemia  Due to missed dialysis x2.  Potassium 7 on admission and had hyperacute T waves.  Received IV calcium gluconate, insulin dextrose, Lokelma and underwent urgent dialysis on day of admission.  Resolved. Is for HD today.  ESRD on MWF HD  Nephrology follow-up appreciated. Discussed with nephrology at bedside.  S/p HD overnight of admission.  HD on 12/21  Essential hypertension  Controlled.  Volume management across dialysis. Will need new EDW for DC due to right transtibial amputation.  Continue amlodipine, carvedilol.  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  Most recent A1c 5.7 on 9/22.  Continue gabapentin  Anemia of ESRD  Stable.  Hemoglobin 11.2 yesterday. Nephrology will check repeat CBC predialysis.  Expected hemoglobin to be lower from recent surgery. Transfuse if hemoglobin 7 g or less.   Secondary hyperparathyroidism  Management per nephrology.  PAD, s/p left AKA after  multiple prior amputations, s/p right first toe amputation  Noted  CAD, s/p CABG  No anginal symptoms.  Continue aspirin, carvedilol.  Chronic pain  Supportive treatment.  Judicious use of opioids, minimize due to intermittent confusion.  Mild transaminitis  Unclear etiology.  Follow CMP at HD tomorrow.  Confusion  Intermittent.  Suspect due to medications i.e. opioids and Vistaril, minimize as able.? Resolved.  DVT prophylaxis: Subcutaneous heparin Code Status: Full Family Communication: None at bedside Disposition: To be determined pending therapies evaluation and clinical improvement.   Consultants:   Orthopedics Nephrology  Procedures:   HD early morning of 10/19 Right transtibial amputation 12/20  Antimicrobials:   IV Zosyn-discontinued 12/19 IV cefepime and oral Flagyl 12/19 > 12/21   Subjective:  Patient intermittently tearful this morning. Upset obviously regarding losing his right leg. He wants to make phone calls to people he knows but his phone is out of charge and unfortunately there is no charging device on the floor. No chest pain or dyspnea. Comforted patient.  Objective:   Vitals:   12/31/18 1500 12/31/18 2057 01/01/19 0447 01/01/19 0957  BP: 110/72 125/87 136/80 (!) 150/93  Pulse: (!) 57 72 80 82  Resp:  '18 16 18  ' Temp:  97.8 F (36.6 C) 98.1 F (36.7 C) 98.5 F (36.9 C)  TempSrc:  Oral Oral Oral  SpO2:  97% 96% 95%  Weight:      Height:        General exam: Young male, moderately built and thinly nourished sitting up in bed without physical distress. Respiratory system: Clear to auscultation. No increased work of breathing. Cardiovascular system: S1 and S2 heard, RRR.  No JVD, murmurs or pedal edema. Gastrointestinal system: Abdomen is nondistended, soft and nontender. No organomegaly or masses felt. Normal bowel sounds heard. Central nervous system: Alert and oriented. No focal neurological deficits. Extremities: Symmetric 5 x 5  power.  Healed left AKA stump. Skin: Right transtibial amputation dressing clean and dry. Psychiatry: Judgement and insight appear normal. Mood & affect tearful..   Picture of right foot taken on 12/30/2018.      Data Reviewed:   I have personally reviewed following labs and imaging studies   CBC: Recent Labs  Lab 12/29/18 1244 12/31/18 1047  WBC 8.9  --   NEUTROABS 7.0  --   HGB 10.3* 11.2*  HCT 32.1* 33.0*  MCV 93.9  --   PLT 191  --     Basic Metabolic Panel: Recent Labs  Lab 12/29/18 1244 12/30/18 0223 12/31/18 1047  NA 138 136 137  K 7.0* 4.1 4.4  CL 95* 95* 98  CO2 24 27  --   GLUCOSE 125* 153* 94  BUN 53* 14 32*  CREATININE 9.94* 4.50* 6.50*  CALCIUM 9.6 9.3  --     Liver Function Tests: Recent Labs  Lab 12/29/18 1244  AST 84*  ALT 51*  ALKPHOS 143*  BILITOT 1.1  PROT 8.0  ALBUMIN 3.0*    CBG: No results for input(s): GLUCAP in the last 168 hours.  Microbiology Studies:   Recent Results (from the past 240 hour(s))  Respiratory Panel by RT PCR (Flu A&B, Covid) - Nasopharyngeal Swab     Status: None   Collection Time: 12/29/18  4:50 PM   Specimen: Nasopharyngeal Swab  Result Value Ref  Range Status   SARS Coronavirus 2 by RT PCR NEGATIVE NEGATIVE Final    Comment: (NOTE) SARS-CoV-2 target nucleic acids are NOT DETECTED. The SARS-CoV-2 RNA is generally detectable in upper respiratoy specimens during the acute phase of infection. The lowest concentration of SARS-CoV-2 viral copies this assay can detect is 131 copies/mL. A negative result does not preclude SARS-Cov-2 infection and should not be used as the sole basis for treatment or other patient management decisions. A negative result may occur with  improper specimen collection/handling, submission of specimen other than nasopharyngeal swab, presence of viral mutation(s) within the areas targeted by this assay, and inadequate number of viral copies (<131 copies/mL). A negative result  must be combined with clinical observations, patient history, and epidemiological information. The expected result is Negative. Fact Sheet for Patients:  PinkCheek.be Fact Sheet for Healthcare Providers:  GravelBags.it This test is not yet ap proved or cleared by the [place] FDA and  has been authorized for detection and/or diagnosis of SARS-CoV-2 by FDA under an Emergency Use Authorization (EUA). This EUA will remain  in effect (meaning this test can be used) for the duration of the COVID-19 declaration under Section 564(b)(1) of the Act, 21 U.S.C. section 360bbb-3(b)(1), unless the authorization is terminated or revoked sooner.    Influenza A by PCR NEGATIVE NEGATIVE Final   Influenza B by PCR NEGATIVE NEGATIVE Final    Comment: (NOTE) The Xpert Xpress SARS-CoV-2/FLU/RSV assay is intended as an aid in  the diagnosis of influenza from Nasopharyngeal swab specimens and  should not be used as a sole basis for treatment. Nasal washings and  aspirates are unacceptable for Xpert Xpress SARS-CoV-2/FLU/RSV  testing. Fact Sheet for Patients: PinkCheek.be Fact Sheet for Healthcare Providers: GravelBags.it This test is not yet approved or cleared by the [place] FDA and  has been authorized for detection and/or diagnosis of SARS-CoV-2 by  FDA under an Emergency Use Authorization (EUA). This EUA will remain  in effect (meaning this test can be used) for the duration of the  Covid-19 declaration under Section 564(b)(1) of the Act, 21  U.S.C. section 360bbb-3(b)(1), unless the authorization is  terminated or revoked. Performed at [facility], [place] [address] [place], [place] [address]   MRSA PCR Screening     Status: None   Collection Time: 12/30/18  1:53 AM   Specimen: Nasal Mucosa; Nasopharyngeal  Result Value Ref Range Status   MRSA by PCR NEGATIVE  NEGATIVE Final    Comment:        The GeneXpert MRSA Assay (FDA approved for NASAL specimens only), is one component of a comprehensive MRSA colonization surveillance program. It is not intended to diagnose MRSA infection nor to guide or monitor treatment for MRSA infections. Performed at [facility], [place] [address], [place], [place] [address]   Surgical PCR screen     Status: None   Collection Time: 12/31/18 10:22 AM   Specimen: Nasal Mucosa; Nasal Swab  Result Value Ref Range Status   MRSA, PCR NEGATIVE NEGATIVE Final   Staphylococcus aureus NEGATIVE NEGATIVE Final    Comment: (NOTE) The Xpert SA Assay (FDA approved for NASAL specimens in patients 37 years of age and older), is one component of a comprehensive surveillance program. It is not intended to diagnose infection nor to guide or monitor treatment. Performed at [facility], [place] [address], [place], [place] [address]      Radiology Studies:  No results found.  Scheduled Meds:   . amLODipine  5 mg Oral QPM  . aspirin EC  81 mg Oral QHS  . carvedilol  12.5 mg Oral BID WC  . Chlorhexidine Gluconate Cloth  6 each Topical Q0600  . Chlorhexidine Gluconate Cloth  6 each Topical Q0600  . [START ON 01/03/2019] darbepoetin (ARANESP) injection - DIALYSIS  100 mcg Intravenous Q Wed-HD  . docusate sodium  100 mg Oral BID  . doxercalciferol  4 mcg Intravenous Q M,W,F-HD  . feeding supplement (NEPRO CARB STEADY)  237 mL Oral BID BM  . gabapentin  200 mg Oral Daily  . heparin  5,000 Units Subcutaneous Q8H  . metroNIDAZOLE  500 mg Oral Q8H  . montelukast  10 mg Oral QHS  . multivitamin  1 tablet Oral QHS  . mupirocin ointment  1 application Nasal BID  . sevelamer carbonate  3,200 mg Oral TID WC  . temazepam  30 mg Oral QHS    Continuous Infusions:   . sodium chloride 250 mL (12/30/18 0126)  . sodium chloride    . methocarbamol (ROBAXIN) IV       LOS: 3 days     Vernell Leep, MD, [place], [facility].  [facility]    To contact the attending provider between 7A-7P or the covering provider during after hours 7P-7A, please log into the web site [email or web address] and access using universal [place] password for that web site. If you do not have the password, please call the hospital operator.  01/01/2019, 12:25 PM

## 2019-01-01 NOTE — Progress Notes (Signed)
Physical Therapy Treatment Patient Details Name: Matthew Kline MRN: [id number] DOB: [birth year] Today's Date: 01/01/2019    History of Present Illness 58 yo male with onset of RLE cellulitis and pain was admitted, has R foot wound with findings of ulceration dorsal to talonavicular joint, over tib anterior tendon without tenosynovitis, no osteomyelitis, achilles tendinosis with no tear, and potential denervation vs myositis from IM edema from intrinsics of foot. s/p R BKA on 12/20. PMHx:  L AK amputation, DM, HTN, lacunar stroke, CHF, CAD, CABG x 3.    PT Comments    Pt states the fact that he has lost his other leg (R BKA 12/20) has not sunken in yet, and pt appears restless during therapy. Pt agreeable to practice seated balance and weight shifting EOB, prosthesis for LLE not present in room and pt with non-drop arm recliner. PT informed RN pt would benefit from drop arm recliner for progression out of bed, and PT requested pt arrange to have prosthesis delivered to hospital if possible. PT continuing to recommend CIR level of care post-acutely.    Follow Up Recommendations  CIR     Equipment Recommendations  None recommended by PT    Recommendations for Other Services Rehab consult     Precautions / Restrictions Precautions Precautions: Fall Restrictions Weight Bearing Restrictions: Yes RLE Weight Bearing: Non weight bearing LLE Weight Bearing: Non weight bearing    Mobility  Bed Mobility Overal bed mobility: Needs Assistance Bed Mobility: Supine to Sit;Sit to Supine     Supine to sit: Min guard;HOB elevated Sit to supine: Min assist;HOB elevated   General bed mobility comments: Min guard for safety for supine to sit with significant HOB elevation, increased time with use of bedrails to come to EOB. Min assist for return to supine for arranging beding, positioning.  Transfers Overall transfer level: (NT - prosthesis not present in room)                   Ambulation/Gait                 Stairs             Wheelchair Mobility    Modified Rankin (Stroke Patients Only)       Balance Overall balance assessment: Needs assistance Sitting-balance support: Bilateral upper extremity supported;Feet supported Sitting balance-Leahy Scale: Fair Sitting balance - Comments: sat EOB without PT assist,  with UE support   Standing balance support: During functional activity   Standing balance comment: not assessed this session                            Cognition Arousal/Alertness: Awake/alert Behavior During Therapy: Restless Overall Cognitive Status: No family/caregiver present to determine baseline cognitive functioning                                 General Comments: Pt pleasant with PT initially, became increasingly restless/frustrated as session continued. Pt states "losing one leg is one thing, losing two...is a lot".      Exercises Amputee Exercises Knee Extension: AROM;Right;5 reps;Seated    General Comments        Pertinent Vitals/Pain Pain Assessment: Faces Faces Pain Scale: Hurts even more Pain Location: back, R residual limb Pain Descriptors / Indicators: Discomfort;Grimacing;Other (Comment)(severe itching) Pain Intervention(s): Limited activity within patient's tolerance;Monitored during session;Premedicated before session;Repositioned    Home Living  Prior Function            PT Goals (current goals can now be found in the care plan section) Acute Rehab PT Goals Patient Stated Goal: none stated PT Goal Formulation: Patient unable to participate in goal setting Time For Goal Achievement: 01/13/19 Potential to Achieve Goals: Good Progress towards PT goals: Progressing toward goals    Frequency    Min 3X/week      PT Plan Current plan remains appropriate    Co-evaluation              AM-PAC PT "6 Clicks" Mobility   Outcome  Measure  Help needed turning from your back to your side while in a flat bed without using bedrails?: A Little Help needed moving from lying on your back to sitting on the side of a flat bed without using bedrails?: A Little Help needed moving to and from a bed to a chair (including a wheelchair)?: A Little Help needed standing up from a chair using your arms (e.g., wheelchair or bedside chair)?: A Lot Help needed to walk in hospital room?: Total Help needed climbing 3-5 steps with a railing? : Total 6 Click Score: 13    End of Session   Activity Tolerance: Patient limited by fatigue;Patient limited by pain Patient left: in bed;with call bell/phone within reach;with bed alarm set Nurse Communication: Mobility status PT Visit Diagnosis: Unsteadiness on feet (R26.81);Muscle weakness (generalized) (M62.81);Pain Pain - Right/Left: Right Pain - part of body: Leg;Ankle and joints of foot     Time: 9826-4158 PT Time Calculation (min) (ACUTE ONLY): 26 min  Charges:  $Therapeutic Activity: 8-22 mins $Neuromuscular Re-education: 8-22 mins                     Orlena Garmon E, PT Acute Rehabilitation Services Pager [phone]  Office [phone]   Dyann Goodspeed D Davine Coba 01/01/2019, 2:40 PM

## 2019-01-01 NOTE — Progress Notes (Signed)
[facility] Progress Note   Dialysis Orders: MWF - NW 4hrs, BFR400, [id number], EDW 65.5kg,2K/2Ca Access:LU AVF Heparin4000 unit bolus Mircera68mcg q2wks - last 12/2 Hectorol50mcg IV qHD   Assessment/Plan: 1. Hyperkalemia -Resolved with dialysis.  2. S/p  BKA with VAC Dr. Sharol Given. -still on flagyl 3. ESRD -  MWF. - HD today back on schedule 4. Hypertension/volume - volume ok - new EDW for d/c due to surgery - continue current meds 5. Anemia of CKD - Hgb 10.3 pre op. - check CBC pre HD today - due for redose - suspect hgb will drop post op - will give Aranesp 100 6. Secondary Hyperparathyroidism - Ca at goal. Phos elevated at OP. Continue VDRA and binders.  Recheck phos pre HD.- use 2 Ca bath 7. Nutrition - Renal diet w/fluid restrictions. - hasn't eaten much this am. Add nepro/renavites 8. Hx CAD 9. DM - per primary 10. Hx seizures 11. PAD s/p amputations  12. Hx anxiety   Myriam Jacobson, PA-C [place] [phone] 01/01/2019,10:11 AM  LOS: 3 days   Subjective:   Really wants inpatient rehab - would be a great candidate.  Dealing with the pain.  Objective Vitals:   12/31/18 1500 12/31/18 2057 01/01/19 0447 01/01/19 0957  BP: 110/72 125/87 136/80 (!) 150/93  Pulse: (!) 57 72 80 82  Resp:  18 16 18   Temp:  97.8 F (36.6 C) 98.1 F (36.7 C) 98.5 F (36.9 C)  TempSrc:  Oral Oral Oral  SpO2:  97% 96% 95%  Weight:      Height:       Physical Exam General: NAD but becomes tearful at times breathing easily sitting up in bed Heart: RRR Lungs: no rales Abdomen: soft NT Extremities: left AKA no edema - right BKA with VAC Dialysis Access: left upper AVF + bruit   Additional Objective Labs: Basic Metabolic Panel: Recent Labs  Lab 12/29/18 1244 12/30/18 0223 12/31/18 1047  NA 138 136 137  K 7.0* 4.1 4.4  CL 95* 95* 98  CO2 24 27  --   GLUCOSE 125* 153* 94  BUN 53* 14 32*  CREATININE 9.94* 4.50* 6.50*   CALCIUM 9.6 9.3  --    Liver Function Tests: Recent Labs  Lab 12/29/18 1244  AST 84*  ALT 51*  ALKPHOS 143*  BILITOT 1.1  PROT 8.0  ALBUMIN 3.0*   No results for input(s): LIPASE, AMYLASE in the last 168 hours. CBC: Recent Labs  Lab 12/29/18 1244 12/31/18 1047  WBC 8.9  --   NEUTROABS 7.0  --   HGB 10.3* 11.2*  HCT 32.1* 33.0*  MCV 93.9  --   PLT 191  --    Blood Culture    Component Value Date/Time   SDES BLOOD RIGHT WRIST 11/24/2018 1432   SPECREQUEST  11/24/2018 1432    BOTTLES DRAWN AEROBIC AND ANAEROBIC Blood Culture adequate volume   CULT  11/24/2018 1432    NO GROWTH 5 DAYS Performed at [facility], [place] [address] [place], [place] [address]    REPTSTATUS 11/29/2018 FINAL 11/24/2018 1432    Cardiac Enzymes: No results for input(s): CKTOTAL, CKMB, CKMBINDEX, TROPONINI in the last 168 hours. CBG: No results for input(s): GLUCAP in the last 168 hours. Iron Studies: No results for input(s): IRON, TIBC, TRANSFERRIN, FERRITIN in the last 72 hours. Lab Results  Component Value Date   INR 1.2 11/24/2018   INR 1.30 11/08/2016   INR 1.2 08/17/2016  Studies/Results: MR ANKLE RIGHT WO CONTRAST  Result Date: 12/30/2018 CLINICAL DATA:  Anterior ankle ulceration, diabetic EXAM: MRI OF THE RIGHT ANKLE WITHOUT CONTRAST TECHNIQUE: Multiplanar, multisequence MR imaging of the ankle was performed. No intravenous contrast was administered. COMPARISON:  X-ray 10/31/2018 FINDINGS: Soft tissue defect at the anterior aspect of the midfoot at the level of the talonavicular joint overlying the tibialis anterior tendon. No underlying fluid collection or abscess. TENDONS Peroneal: Intact peroneus longus and peroneus brevis tendons. Posteromedial: Intact tibialis posterior, flexor hallucis longus and flexor digitorum longus tendons. Trace fluid within the flexor hallucis longus tendon sheath. Anterior: Tibialis anterior tendon is enlarged with a small amount of fluid signal  within the tendon sheath. Tiny focus of susceptibility artifact within the tendon sheath just proximal to the level of the skin ulceration (series 9, image 11) suggesting a focus of gas. Tibialis anterior, extensor hallucis longus, and extensor digitorum longus tendons appear otherwise intact without tear. Achilles: Mild intermediate signal within the medial aspect of the distal Achilles tendon suggesting tendinosis without tear. Plantar Fascia: Intact. LIGAMENTS Lateral: Thickened, heterogeneous appearance of the posterior talofibular ligament which may reflect sequela of prior injury. Anterior talofibular ligament is intact and unremarkable. Remaining lateral ankle ligaments intact. Medial: Grossly intact. CARTILAGE Ankle Joint: No joint effusion or chondral defect. Subtalar Joints/Sinus Tarsi: No joint effusion or chondral defect. Bones: Thin subcortical marrow edema at the dorsal navicular (series 7, image 24) without corresponding low T1 signal. There is no evidence of overlying cortical destruction. Findings are likely reactive. Remaining bone marrow is otherwise within normal limits. No fracture. No dislocation. Other: Mild intramuscular edema like signal within the intrinsic foot musculature may reflect denervation changes versus myositis. IMPRESSION: 1. Soft tissue ulceration with associated cellulitis dorsal to the talonavicular joint and overlying the tibialis anterior tendon. No underlying fluid collection or abscess. 2. Thin subcortical marrow edema at the dorsal navicular without corresponding low T1 signal or overlying cortical destruction to suggest osteomyelitis. Findings are likely reactive. 3. Ulceration overlies the tibialis anterior tendon which is diffusely thickened with mild tenosynovitis. Tiny focus of susceptibility within the tendon sheath likely reflecting a small focus of air related to overlying skin ulcer. 4. Mild intramuscular edema-like signal within the intrinsic foot musculature  may reflect denervation changes versus myositis. 5. Mild distal Achilles tendinosis without tear. Electronically Signed   By: Davina Poke M.D.   On: 12/30/2018 13:00   Medications: . sodium chloride 250 mL (12/30/18 0126)  . sodium chloride    . ceFEPime (MAXIPIME) IV 1 g (01/01/19 0300)  . methocarbamol (ROBAXIN) IV     . amLODipine  5 mg Oral QPM  . aspirin EC  81 mg Oral QHS  . carvedilol  12.5 mg Oral BID WC  . Chlorhexidine Gluconate Cloth  6 each Topical Q0600  . Chlorhexidine Gluconate Cloth  6 each Topical Q0600  . docusate sodium  100 mg Oral BID  . doxercalciferol  4 mcg Intravenous Q M,W,F-HD  . gabapentin  200 mg Oral Daily  . heparin  5,000 Units Subcutaneous Q8H  . metroNIDAZOLE  500 mg Oral Q8H  . montelukast  10 mg Oral QHS  . mupirocin ointment  1 application Nasal BID  . sevelamer carbonate  3,200 mg Oral TID WC  . temazepam  30 mg Oral QHS

## 2019-01-01 NOTE — Progress Notes (Signed)
Initial Nutrition Assessment  DOCUMENTATION CODES:   Non-severe (moderate) malnutrition in context of chronic illness  INTERVENTION:  Provide Nepro Shake po TID, each supplement provides 425 kcal and 19 grams protein.  Provide 30 ml Prostat po BID, each supplement provides 100 kcal and 15 grams of protein.   Encourage adequate PO intake.   NUTRITION DIAGNOSIS:   Moderate Malnutrition related to chronic illness(ESRD on HD, CHF) as evidenced by moderate muscle depletion, moderate fat depletion.  GOAL:   Patient will meet greater than or equal to 90% of their needs  MONITOR:   PO intake, Supplement acceptance, Skin, Weight trends, Labs, I & O's  REASON FOR ASSESSMENT:   Consult Wound healing  ASSESSMENT:   58 year old male PMH of ESRD due to diabetic nephropathy on MWF HD, DM 2 with peripheral neuropathy, HTN, HLD, CVA/lacunar stroke, CAD, s/p CABG, chronic diastolic CHF, PAD s/p left AKA and right great toe amputation, chronic foot ulcers, PMR with chronic pain, GERD, tobacco abuse presented to ED due to worsening of right chronic foot ulcer with associated drainage, redness and pain of 3 days duration.   PROCEDURE (12/20): Transtibial amputation (R leg) Application of Prevena wound VAC  Pt reports having a decreased appetite due to pain. Meal completion 0% this morning. Pt reports eating well PTA with usual consumption of at least 2-3 meals a day. Pt unable to quantify foods eaten at meals. RD to order nutritional supplements to aid in caloric and protein needs as well as in wound healing. Pt encouraged to eat his food at meals and to drink his supplements.   NUTRITION - FOCUSED PHYSICAL EXAM:    Most Recent Value  Orbital Region  Moderate depletion  Upper Arm Region  Moderate depletion  Thoracic and Lumbar Region  Moderate depletion  Buccal Region  Moderate depletion  Temple Region  Moderate depletion  Clavicle Bone Region  Moderate depletion  Clavicle and Acromion  Bone Region  Moderate depletion  Scapular Bone Region  Moderate depletion  Dorsal Hand  Moderate depletion  Patellar Region  Unable to assess  Anterior Thigh Region  Unable to assess  Posterior Calf Region  Unable to assess  Edema (RD Assessment)  Mild  Hair  Reviewed  Eyes  Reviewed  Mouth  Reviewed  Skin  Reviewed  Nails  Reviewed     Labs and medications reviewed. Phosphorous elevated at 8.1. Potassium elevated at 5.3.  Diet Order:   Diet Order            Diet renal with fluid restriction Fluid restriction: 1200 mL Fluid; Room service appropriate? Yes; Fluid consistency: Thin  Diet effective now              EDUCATION NEEDS:   Not appropriate for education at this time  Skin:  Skin Assessment: Skin Integrity Issues: Skin Integrity Issues:: Incisions, Wound VAC Wound Vac: R leg Incisions: R leg  Last BM:  12/19  Height:   Ht Readings from Last 1 Encounters:  12/30/18 5' 11.5" (1.816 m)    Weight:   Wt Readings from Last 1 Encounters:  01/01/19 69.3 kg    Ideal Body Weight:  67.9 kg(adjusted for L AKA, R BKA)  BMI:  Body mass index is 21.01 kg/m.  Estimated Nutritional Needs:   Kcal:  2100-2300  Protein:  105-115 grams  Fluid:  1.2 L/day    Corrin Parker, MS, RD, LDN Pager # [phone] After hours/ weekend pager # [phone]

## 2019-01-01 NOTE — Progress Notes (Signed)
VSS . Patient doing ok. Really wants inpatient rehab as goal is to go home. . Will work with PT OT to see if CIR is an option.

## 2019-01-02 DIAGNOSIS — S88111A Complete traumatic amputation at level between knee and ankle, right lower leg, initial encounter: Secondary | ICD-10-CM

## 2019-01-02 LAB — CBC
HCT: 31.9 % — ABNORMAL LOW (ref 39.0–52.0)
Hemoglobin: 10.1 g/dL — ABNORMAL LOW (ref 13.0–17.0)
MCH: 29.9 pg (ref 26.0–34.0)
MCHC: 31.7 g/dL (ref 30.0–36.0)
MCV: 94.4 fL (ref 80.0–100.0)
Platelets: 187 10*3/uL (ref 150–400)
RBC: 3.38 MIL/uL — ABNORMAL LOW (ref 4.22–5.81)
RDW: 14.1 % (ref 11.5–15.5)
WBC: 9 10*3/uL (ref 4.0–10.5)
nRBC: 0 % (ref 0.0–0.2)

## 2019-01-02 LAB — GLUCOSE, CAPILLARY
Glucose-Capillary: 87 mg/dL (ref 70–99)
Glucose-Capillary: 89 mg/dL (ref 70–99)

## 2019-01-02 MED ORDER — OXYCODONE HCL 5 MG PO TABS
10.0000 mg | ORAL_TABLET | ORAL | Status: DC | PRN
  Administered 2019-01-02 – 2019-01-04 (×8): 10 mg via ORAL
  Filled 2019-01-02 (×8): qty 2

## 2019-01-02 MED ORDER — CHLORHEXIDINE GLUCONATE CLOTH 2 % EX PADS: 6.0000 | MEDICATED_PAD | Freq: Every day | CUTANEOUS | Status: DC

## 2019-01-02 MED ORDER — DARBEPOETIN ALFA 60 MCG/0.3ML IJ SOSY
60.0000 ug | PREFILLED_SYRINGE | INTRAMUSCULAR | Status: DC
  Administered 2019-01-03: 60 ug via INTRAVENOUS
  Filled 2019-01-02 (×2): qty 0.3

## 2019-01-02 MED ORDER — KETOROLAC TROMETHAMINE 30 MG/ML IJ SOLN
30.0000 mg | Freq: Four times a day (QID) | INTRAMUSCULAR | Status: DC
  Administered 2019-01-02 – 2019-01-05 (×9): 30 mg via INTRAVENOUS
  Filled 2019-01-02 (×10): qty 1

## 2019-01-02 MED ORDER — OXYCODONE HCL 5 MG PO TABS
5.0000 mg | ORAL_TABLET | ORAL | Status: DC | PRN
  Filled 2019-01-02: qty 1

## 2019-01-02 NOTE — Evaluation (Signed)
Occupational Therapy Evaluation Patient Details Name: Matthew Kline MRN: [id number] DOB: September 06, 1960 Today's Date: 01/02/2019    History of Present Illness 58 yo male with onset of RLE cellulitis and pain was admitted, has R foot wound with findings of ulceration dorsal to talonavicular joint, over tib anterior tendon without tenosynovitis, no osteomyelitis, achilles tendinosis with no tear, and potential denervation vs myositis from IM edema from intrinsics of foot. s/p R BKA on 12/20. PMHx:  L AK amputation, DM, HTN, lacunar stroke, CHF, CAD, CABG x 3.   Clinical Impression   PTA patient independent using prosthetic or w/c (at times RW) for mobility, ADLs.  Admitted for above and limited by problem list below, including impaired balance, decreased activity tolerance, pain in R residual limb, and impaired cognition. Pt pleasant and cooperative, following simple commands but highly distracted by "itching" requiring redirection to task, awareness improving to deficits and need for assistance currently.  Patient able to complete bed mobility and sitting EOB with min guard assist, UB ADls with supervision and LB ADLs with mod assist.  He will benefit from intensive CIR level rehab in order to optimize independence with ADLs, mobility at a wheelchair level prior to dc home with 24/7 support from daughter.     Follow Up Recommendations  CIR;Supervision/Assistance - 24 hour    Equipment Recommendations  3 in 1 bedside commode(drop arm )    Recommendations for Other Services Rehab consult     Precautions / Restrictions Precautions Precautions: Fall Restrictions Weight Bearing Restrictions: Yes RLE Weight Bearing: Non weight bearing LLE Weight Bearing: Non weight bearing      Mobility Bed Mobility Overal bed mobility: Needs Assistance Bed Mobility: Supine to Sit;Sit to Supine     Supine to sit: Min guard;HOB elevated Sit to supine: Min guard;HOB elevated   General bed mobility  comments: using BUEs to transition to long sitting in bed, then pivoting to EOB with min guard; returned to supine with increased time and guarding support  Transfers                 General transfer comment: deferred    Balance Overall balance assessment: Needs assistance Sitting-balance support: No upper extremity supported Sitting balance-Leahy Scale: Fair Sitting balance - Comments: min guard for safety fading to close supervision, while engaging in ADLs seated                                   ADL either performed or assessed with clinical judgement   ADL Overall ADL's : Needs assistance/impaired     Grooming: Set up;Sitting   Upper Body Bathing: Set up;Sitting;Supervision/ safety   Lower Body Bathing: Moderate assistance;Sitting/lateral leans Lower Body Bathing Details (indicate cue type and reason): limited by pain and balance Upper Body Dressing : Minimal assistance;Sitting Upper Body Dressing Details (indicate cue type and reason): donning new gown Lower Body Dressing: Moderate assistance;Sitting/lateral leans     Toilet Transfer Details (indicate cue type and reason): deferred         Functional mobility during ADLs: Cueing for safety;Min guard General ADL Comments: pt limited by pain, balance     Vision   Vision Assessment?: No apparent visual deficits     Perception     Praxis      Pertinent Vitals/Pain Pain Assessment: Faces Faces Pain Scale: Hurts even more Pain Location: R residual limb, generalized itching  Pain Descriptors / Indicators: Discomfort;Grimacing;Other (Comment)(severe  itching ) Pain Intervention(s): Limited activity within patient's tolerance;Monitored during session;Repositioned     Hand Dominance Right   Extremity/Trunk Assessment Upper Extremity Assessment Upper Extremity Assessment: Overall WFL for tasks assessed   Lower Extremity Assessment Lower Extremity Assessment: Defer to PT evaluation(prior L  AKA, new R BKA)   Cervical / Trunk Assessment Cervical / Trunk Assessment: Normal   Communication Communication Communication: No difficulties   Cognition Arousal/Alertness: Awake/alert Behavior During Therapy: Restless Overall Cognitive Status: Impaired/Different from baseline Area of Impairment: Attention;Following commands;Awareness;Problem solving                   Current Attention Level: Sustained   Following Commands: Follows one step commands consistently;Follows one step commands with increased time;Follows multi-step commands inconsistently   Awareness: Emergent Problem Solving: Slow processing;Requires verbal cues General Comments: patient pleasant and cooperative, oriented; highly distracted by itching during session and improving awareness of deficits but focused on future vs now (asking "can I drive again?")   General Comments       Exercises     Shoulder Instructions      Home Living Family/patient expects to be discharged to:: Private residence Living Arrangements: Children(daughter) Available Help at Discharge: Family;Available 24 hours/day Type of Home: House       Home Layout: One level     Bathroom Shower/Tub: Tub/shower unit         Home Equipment: Environmental consultant - 2 wheels;Wheelchair - manual;Tub bench   Additional Comments: L LE prosthetic      Prior Functioning/Environment Level of Independence: Independent with assistive device(s)        Comments: uses prosthetic or w/c for moblity, sometimes using RW; independent ADLs         OT Problem List: Decreased activity tolerance;Impaired balance (sitting and/or standing);Decreased cognition;Decreased safety awareness;Decreased knowledge of use of DME or AE;Decreased knowledge of precautions;Pain      OT Treatment/Interventions: Self-care/ADL training;DME and/or AE instruction;Therapeutic exercise;Therapeutic activities;Patient/family education;Balance training    OT Goals(Current goals  can be found in the care plan section) Acute Rehab OT Goals Patient Stated Goal: to get to hospital rehab OT Goal Formulation: With patient Time For Goal Achievement: 01/16/19 Potential to Achieve Goals: Good  OT Frequency: Min 2X/week   Barriers to D/C:            Co-evaluation              AM-PAC OT "6 Clicks" Daily Activity     Outcome Measure Help from another person eating meals?: None Help from another person taking care of personal grooming?: A Little Help from another person toileting, which includes using toliet, bedpan, or urinal?: A Lot Help from another person bathing (including washing, rinsing, drying)?: A Little Help from another person to put on and taking off regular upper body clothing?: A Little Help from another person to put on and taking off regular lower body clothing?: A Lot 6 Click Score: 17   End of Session Nurse Communication: Mobility status  Activity Tolerance: Patient tolerated treatment well Patient left: in bed;with call bell/phone within reach;with bed alarm set  OT Visit Diagnosis: Other abnormalities of gait and mobility (R26.89);Pain Pain - Right/Left: Right Pain - part of body: Leg                Time: 1535-1601 OT Time Calculation (min): 26 min Charges:  OT General Charges $OT Visit: 1 Visit OT Evaluation $OT Eval Moderate Complexity: 1 Mod OT Treatments $Self Care/Home Management : 8-22  mins  Jolaine Artist, [place] [facility] Pager [phone] Office [phone]   Matthew Kline 01/02/2019, 5:01 PM

## 2019-01-02 NOTE — Care Management Important Message (Signed)
Important Message  Patient Details  Name: Matthew Kline MRN: [id number] Date of Birth: 07-31-1960   Medicare Important Message Given:  Yes     Miia Blanks [address] 01/02/2019, 9:52 AM

## 2019-01-02 NOTE — Progress Notes (Signed)
[facility] Progress Note   Dialysis Orders: MWF - NW 4hrs, BFR400, [id number], EDW 65.5kg,2K/2Ca Access:LU AVF Heparin4000 unit bolus Mircera88mcg q2wks - last 12/2 Hectorol59mcg IV qHD   Assessment/Plan: 1. Hyperkalemia -Resolved with dialysis.  2. S/p right  BKA with VAC Dr. Sharol Given. -still on flagyl 3. ESRD -  MWF. - HD tomorrow - run 3.5 hours due to high inpatient HD census - tight heparin 4. Hypertension/volume - volume ok - new EDW for d/c due to surgery - continue current meds net UF 1.5 Monday 5. Anemia of CKD - Hgb 10.1 stable Monday - will give 60 Wed 6. Secondary Hyperparathyroidism -  Phos elevated Continue VDRA and binders..- use 2 Ca bath 7. Nutrition - Renal diet w/fluid restrictions. - hasn't eaten breakfast yet. Added nepro/renavites 8. Hx CAD 9. DM - per primary 10. Hx seizures 11. PAD s/p amputations  12. Hx anxiety  13. Confusion - suspect related to meds 14. Disp - would be good candidate for CIR  Myriam Jacobson, PA-C [place] [phone] 01/02/2019,9:35 AM  LOS: 4 days   Subjective:   Can state my name but can't seem to come up with an answer for name of place, president and year.  Not participatory in washing up self.    Objective Vitals:   01/01/19 1743 01/01/19 2028 01/02/19 0501 01/02/19 0751  BP: (!) 152/97 (!) 144/89 (!) 146/82 (!) 143/85  Pulse: 83 87 91 92  Resp: 20 20 20 18   Temp: 99.9 F (37.7 C) 98.6 F (37 C) 98.5 F (36.9 C) 98.7 F (37.1 C)  TempSrc: Oral Oral Oral Oral  SpO2: 95% 92% 91% 95%  Weight:  69.8 kg    Height:       Physical Exam General: NAD breathing easily on room air Heart: RRR Lungs: no rales Abdomen: soft NT Extremities: left AKA no edema - right BKA with VAC Dialysis Access: left upper AVF + bruit   Additional Objective Labs: Basic Metabolic Panel: Recent Labs  Lab 12/29/18 1244 12/30/18 0223 12/31/18 1047 01/01/19 1316  NA 138 136  137 136  K 7.0* 4.1 4.4 5.3*  CL 95* 95* 98 96*  CO2 24 27  --  24  GLUCOSE 125* 153* 94 129*  BUN 53* 14 32* 42*  CREATININE 9.94* 4.50* 6.50* 8.54*  CALCIUM 9.6 9.3  --  9.1  PHOS  --   --   --  8.1*   Liver Function Tests: Recent Labs  Lab 12/29/18 1244 01/01/19 1316  AST 84*  --   ALT 51*  --   ALKPHOS 143*  --   BILITOT 1.1  --   PROT 8.0  --   ALBUMIN 3.0* 2.8*   No results for input(s): LIPASE, AMYLASE in the last 168 hours. CBC: Recent Labs  Lab 12/29/18 1244 12/31/18 1047 01/01/19 1316 01/02/19 0458  WBC 8.9  --  8.5 9.0  NEUTROABS 7.0  --   --   --   HGB 10.3* 11.2* 9.5* 10.1*  HCT 32.1* 33.0* 29.8* 31.9*  MCV 93.9  --  92.8 94.4  PLT 191  --  196 187   Blood Culture    Component Value Date/Time   SDES BLOOD RIGHT WRIST 11/24/2018 1432   SPECREQUEST  11/24/2018 1432    BOTTLES DRAWN AEROBIC AND ANAEROBIC Blood Culture adequate volume   CULT  11/24/2018 1432    NO GROWTH 5 DAYS Performed at [facility], [address]  [address] [place], [place] [address]    REPTSTATUS 11/29/2018 FINAL 11/24/2018 1432    Cardiac Enzymes: No results for input(s): CKTOTAL, CKMB, CKMBINDEX, TROPONINI in the last 168 hours. CBG: Recent Labs  Lab 01/01/19 2031 01/02/19 0658  GLUCAP 126* 87   Iron Studies: No results for input(s): IRON, TIBC, TRANSFERRIN, FERRITIN in the last 72 hours. Lab Results  Component Value Date   INR 1.2 11/24/2018   INR 1.30 11/08/2016   INR 1.2 08/17/2016   Studies/Results: No results found. Medications: . sodium chloride 250 mL (12/30/18 0126)  . sodium chloride    . methocarbamol (ROBAXIN) IV     . amLODipine  5 mg Oral QPM  . aspirin EC  81 mg Oral QHS  . carvedilol  12.5 mg Oral BID WC  . Chlorhexidine Gluconate Cloth  6 each Topical Q0600  . Chlorhexidine Gluconate Cloth  6 each Topical Q0600  . [START ON 01/03/2019] darbepoetin (ARANESP) injection - DIALYSIS  100 mcg Intravenous Q Wed-HD  . docusate sodium  100 mg Oral BID   . doxercalciferol  4 mcg Intravenous Q M,W,F-HD  . feeding supplement (NEPRO CARB STEADY)  237 mL Oral TID BM  . feeding supplement (PRO-STAT SUGAR FREE 64)  30 mL Oral BID  . gabapentin  200 mg Oral Daily  . heparin  5,000 Units Subcutaneous Q8H  . montelukast  10 mg Oral QHS  . multivitamin  1 tablet Oral QHS  . mupirocin ointment  1 application Nasal BID  . sevelamer carbonate  3,200 mg Oral TID WC  . temazepam  30 mg Oral QHS

## 2019-01-02 NOTE — Progress Notes (Signed)
PROGRESS NOTE    Matthew Kline   KGM:[id number]  DOB: 02-08-1960  DOA: 12/29/2018 PCP: [facility]   Brief Narrative:  Matthew Kline 58 year old male, lives with family, reportedly ambulates with the help of left lower extremity prosthesis, PMH of ESRD due to diabetic nephropathy on MWF HD, DM 2 with peripheral neuropathy, HTN, HLD, CVA/lacunar stroke, CAD, s/p CABG, chronic diastolic CHF, PAD s/p left AKA and right great toe amputation, chronic foot ulcers, PMR with chronic pain, GERD, tobacco abuse presented to ED due to worsening of right chronic foot ulcer with associated drainage, redness and pain of 3 days duration.  He missed 2 days of dialysis due to the pain and difficulty to weight-bear.  In the ED, potassium 7, creatinine 9.94.  Admitted for RLE infected wound with cellulitis, possible abscess and osteomyelitis, hyperkalemia after missed dialysis.  Nephrology consulted and emergently underwent HD on day of admission with resolution of hyperkalemia.  Orthopedics/Dr. Sharol Given consulted.  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 Therapies evaluation pending. Hopefully appropriate for CIR.   Subjective: Pain in right stump.     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 - awaiting CIR eval - cont to control pain  Active Problems: Confusion - noted to be confused today- per RN, this is worse after IV Dilaudid- he is also receiving 10-15 mg of Oxycodone repeatedly since  - due to ESRD, his narcotic clearance is poor- I have reduced doses of narcotics to decrease his level of confusion and have ordered IV Toradol instead    Essential hypertension - cont Amlodipine, Carvedilol     End-stage renal disease on hemodialysis - SHPT - cont HD on MWF and cont Renvela    Diabetic polyneuropathy associated with type 2 diabetes mellitus  - cont Gabapentin   Moderate  Malnutrition  - cont Nepro shake, Prostat   Time spent in minutes: 35 DVT prophylaxis: Heparin Code Status: Full code Family Communication:  Disposition Plan: awaiting CIR vs SNF Consultants:   Ortho  Nephrology  Procedures:   Right transtibial amputation Antimicrobials:  Anti-infectives (From admission, onward)   Start     Dose/Rate Route Frequency Ordered Stop   12/31/18 1013  ceFAZolin (ANCEF) 2-4 GM/100ML-% IVPB    Note to Pharmacy: Bobbie Stack   : cabinet override      12/31/18 1013 12/31/18 1122   12/31/18 1000  ceFAZolin (ANCEF) IVPB 2g/100 mL premix     2 g 200 mL/hr over 30 Minutes Intravenous On call to O.R. 12/31/18 0956 12/31/18 1122   12/30/18 1900  ceFEPIme (MAXIPIME) 1 g in sodium chloride 0.9 % 100 mL IVPB  Status:  Discontinued     1 g 200 mL/hr over 30 Minutes Intravenous Every 24 hours 12/30/18 1314 01/01/19 1019   12/30/18 1400  metroNIDAZOLE (FLAGYL) tablet 500 mg     500 mg Oral Every 8 hours 12/30/18 1251 01/01/19 1400   12/30/18 0600  piperacillin-tazobactam (ZOSYN) IVPB 2.25 g  Status:  Discontinued     2.25 g 100 mL/hr over 30 Minutes Intravenous Every 8 hours 12/29/18 1947 12/30/18 1251   12/29/18 2200  piperacillin-tazobactam (ZOSYN) IVPB 2.25 g  Status:  Discontinued     2.25 g 100 mL/hr over 30 Minutes Intravenous Every 8 hours 12/29/18 1932 12/29/18 1947   12/29/18 2200  piperacillin-tazobactam (ZOSYN) IVPB 3.375 g     3.375 g 100  mL/hr over 30 Minutes Intravenous  Once 12/29/18 1947 12/30/18 0317   12/29/18 1830  ceFAZolin (ANCEF) IVPB 1 g/50 mL premix  Status:  Discontinued     1 g 100 mL/hr over 30 Minutes Intravenous  Once 12/29/18 1818 12/29/18 1932       Objective: Vitals:   01/01/19 1743 01/01/19 2028 01/02/19 0501 01/02/19 0751  BP: (!) 152/97 (!) 144/89 (!) 146/82 (!) 143/85  Pulse: 83 87 91 92  Resp: 20 20 20 18   Temp: 99.9 F (37.7 C) 98.6 F (37 C) 98.5 F (36.9 C) 98.7 F (37.1 C)  TempSrc: Oral Oral Oral Oral    SpO2: 95% 92% 91% 95%  Weight:  69.8 kg    Height:        Intake/Output Summary (Last 24 hours) at 01/02/2019 1508 Last data filed at 01/02/2019 0530 Gross per 24 hour  Intake 0 ml  Output 1501 ml  Net -1501 ml   Filed Weights   01/01/19 1250 01/01/19 1700 01/01/19 2028  Weight: 69.3 kg 65 kg 69.8 kg    Examination: General exam: Appears comfortable  HEENT: PERRLA, oral mucosa moist, no sclera icterus or thrush Respiratory system: Clear to auscultation. Respiratory effort normal. Cardiovascular system: S1 & S2 heard, RRR.   Gastrointestinal system: Abdomen soft, non-tender, nondistended. Normal bowel sounds. Central nervous system: Alert and oriented to place and person. No focal neurological deficits. Extremities: No cyanosis, clubbing or edema Skin: No rashes or ulcers Psychiatry:  Agitated and confused to situation    Data Reviewed: I have personally reviewed following labs and imaging studies  CBC: Recent Labs  Lab 12/29/18 1244 12/31/18 1047 01/01/19 1316 01/02/19 0458  WBC 8.9  --  8.5 9.0  NEUTROABS 7.0  --   --   --   HGB 10.3* 11.2* 9.5* 10.1*  HCT 32.1* 33.0* 29.8* 31.9*  MCV 93.9  --  92.8 94.4  PLT 191  --  196 782   Basic Metabolic Panel: Recent Labs  Lab 12/29/18 1244 12/30/18 0223 12/31/18 1047 01/01/19 1316  NA 138 136 137 136  K 7.0* 4.1 4.4 5.3*  CL 95* 95* 98 96*  CO2 24 27  --  24  GLUCOSE 125* 153* 94 129*  BUN 53* 14 32* 42*  CREATININE 9.94* 4.50* 6.50* 8.54*  CALCIUM 9.6 9.3  --  9.1  PHOS  --   --   --  8.1*   GFR: Estimated Creatinine Clearance: 9.2 mL/min (A) (by C-G formula based on SCr of 8.54 mg/dL (H)). Liver Function Tests: Recent Labs  Lab 12/29/18 1244 01/01/19 1316  AST 84*  --   ALT 51*  --   ALKPHOS 143*  --   BILITOT 1.1  --   PROT 8.0  --   ALBUMIN 3.0* 2.8*   No results for input(s): LIPASE, AMYLASE in the last 168 hours. No results for input(s): AMMONIA in the last 168 hours. Coagulation  Profile: No results for input(s): INR, PROTIME in the last 168 hours. Cardiac Enzymes: No results for input(s): CKTOTAL, CKMB, CKMBINDEX, TROPONINI in the last 168 hours. BNP (last 3 results) No results for input(s): PROBNP in the last 8760 hours. HbA1C: No results for input(s): HGBA1C in the last 72 hours. CBG: Recent Labs  Lab 01/01/19 2031 01/02/19 0658 01/02/19 1147  GLUCAP 126* 87 89   Lipid Profile: No results for input(s): CHOL, HDL, LDLCALC, TRIG, CHOLHDL, LDLDIRECT in the last 72 hours. Thyroid Function Tests: No results for input(s):  TSH, T4TOTAL, FREET4, T3FREE, THYROIDAB in the last 72 hours. Anemia Panel: No results for input(s): VITAMINB12, FOLATE, FERRITIN, TIBC, IRON, RETICCTPCT in the last 72 hours. Urine analysis:    Component Value Date/Time   COLORURINE YELLOW 01/29/2017 1141   APPEARANCEUR CLOUDY (A) 01/29/2017 1141   APPEARANCEUR Clear 07/12/2013 0918   LABSPEC 1.011 01/29/2017 1141   PHURINE 8.0 01/29/2017 1141   GLUCOSEU 50 (A) 01/29/2017 1141   HGBUR SMALL (A) 01/29/2017 1141   BILIRUBINUR NEGATIVE 01/29/2017 1141   BILIRUBINUR Negative 07/12/2013 0918   KETONESUR NEGATIVE 01/29/2017 1141   PROTEINUR 100 (A) 01/29/2017 1141   UROBILINOGEN 0.2 01/03/2012 1335   NITRITE NEGATIVE 01/29/2017 1141   LEUKOCYTESUR LARGE (A) 01/29/2017 1141   LEUKOCYTESUR Negative 07/12/2013 0918   Sepsis Labs: @LABRCNTIP (procalcitonin:4,lacticidven:4) ) Recent Results (from the past 240 hour(s))  Respiratory Panel by RT PCR (Flu A&B, Covid) - Nasopharyngeal Swab     Status: None   Collection Time: 12/29/18  4:50 PM   Specimen: Nasopharyngeal Swab  Result Value Ref Range Status   SARS Coronavirus 2 by RT PCR NEGATIVE NEGATIVE Final    Comment: (NOTE) SARS-CoV-2 target nucleic acids are NOT DETECTED. The SARS-CoV-2 RNA is generally detectable in upper respiratoy specimens during the acute phase of infection. The lowest concentration of SARS-CoV-2 viral copies this  assay can detect is 131 copies/mL. A negative result does not preclude SARS-Cov-2 infection and should not be used as the sole basis for treatment or other patient management decisions. A negative result may occur with  improper specimen collection/handling, submission of specimen other than nasopharyngeal swab, presence of viral mutation(s) within the areas targeted by this assay, and inadequate number of viral copies (<131 copies/mL). A negative result must be combined with clinical observations, patient history, and epidemiological information. The expected result is Negative. Fact Sheet for Patients:  PinkCheek.be Fact Sheet for Healthcare Providers:  GravelBags.it This test is not yet ap proved or cleared by the [place] FDA and  has been authorized for detection and/or diagnosis of SARS-CoV-2 by FDA under an Emergency Use Authorization (EUA). This EUA will remain  in effect (meaning this test can be used) for the duration of the COVID-19 declaration under Section 564(b)(1) of the Act, 21 U.S.C. section 360bbb-3(b)(1), unless the authorization is terminated or revoked sooner.    Influenza A by PCR NEGATIVE NEGATIVE Final   Influenza B by PCR NEGATIVE NEGATIVE Final    Comment: (NOTE) The Xpert Xpress SARS-CoV-2/FLU/RSV assay is intended as an aid in  the diagnosis of influenza from Nasopharyngeal swab specimens and  should not be used as a sole basis for treatment. Nasal washings and  aspirates are unacceptable for Xpert Xpress SARS-CoV-2/FLU/RSV  testing. Fact Sheet for Patients: PinkCheek.be Fact Sheet for Healthcare Providers: GravelBags.it This test is not yet approved or cleared by the [place] FDA and  has been authorized for detection and/or diagnosis of SARS-CoV-2 by  FDA under an Emergency Use Authorization (EUA). This EUA will remain  in  effect (meaning this test can be used) for the duration of the  Covid-19 declaration under Section 564(b)(1) of the Act, 21  U.S.C. section 360bbb-3(b)(1), unless the authorization is  terminated or revoked. Performed at [facility], [place] [address] [place], [place] [address]   MRSA PCR Screening     Status: None   Collection Time: 12/30/18  1:53 AM   Specimen: Nasal Mucosa; Nasopharyngeal  Result Value Ref Range Status   MRSA by PCR  NEGATIVE NEGATIVE Final    Comment:        The GeneXpert MRSA Assay (FDA approved for NASAL specimens only), is one component of a comprehensive MRSA colonization surveillance program. It is not intended to diagnose MRSA infection nor to guide or monitor treatment for MRSA infections. Performed at [facility], [place] [address], [place], [place] [address]   Surgical PCR screen     Status: None   Collection Time: 12/31/18 10:22 AM   Specimen: Nasal Mucosa; Nasal Swab  Result Value Ref Range Status   MRSA, PCR NEGATIVE NEGATIVE Final   Staphylococcus aureus NEGATIVE NEGATIVE Final    Comment: (NOTE) The Xpert SA Assay (FDA approved for NASAL specimens in patients 19 years of age and older), is one component of a comprehensive surveillance program. It is not intended to diagnose infection nor to guide or monitor treatment. Performed at [facility], [place] [address] [place], [place] [address]          Radiology Studies: No results found.    Scheduled Meds: . amLODipine  5 mg Oral QPM  . aspirin EC  81 mg Oral QHS  . carvedilol  12.5 mg Oral BID WC  . Chlorhexidine Gluconate Cloth  6 each Topical Q0600  . [START ON 01/03/2019] darbepoetin (ARANESP) injection - DIALYSIS  60 mcg Intravenous Q Wed-HD  . docusate sodium  100 mg Oral BID  . doxercalciferol  4 mcg Intravenous Q M,W,F-HD  . feeding supplement (NEPRO CARB STEADY)  237 mL Oral TID BM  . feeding supplement (PRO-STAT SUGAR FREE 64)  30 mL Oral BID  . gabapentin   200 mg Oral Daily  . heparin  5,000 Units Subcutaneous Q8H  . ketorolac  30 mg Intravenous Q6H  . montelukast  10 mg Oral QHS  . multivitamin  1 tablet Oral QHS  . mupirocin ointment  1 application Nasal BID  . sevelamer carbonate  3,200 mg Oral TID WC  . temazepam  30 mg Oral QHS   Continuous Infusions: . sodium chloride 250 mL (12/30/18 0126)  . sodium chloride    . methocarbamol (ROBAXIN) IV       LOS: 4 days      Debbe Odea, MD [facility] Pager: [email or web address] Password [facility] 01/02/2019, 3:08 PM

## 2019-01-02 NOTE — Progress Notes (Signed)
Inpatient Rehab Admissions:  Inpatient Rehab Consult received.  I met with patient at the bedside for rehabilitation assessment and to discuss goals and expectations of an inpatient rehab admission.  He is distracted and perseverating on pain in R residual limb.  Unable to discuss rehab at this time.  I let nurse know of pain and will f/u tomorrow.   Signed: Shann Medal, PT, DPT Admissions Coordinator [phone] 01/02/19  12:09 PM

## 2019-01-03 MED ORDER — HYDROXYZINE HCL 25 MG PO TABS: 75.0000 mg | ORAL_TABLET | Freq: Three times a day (TID) | ORAL | Status: DC

## 2019-01-03 MED ORDER — DOXERCALCIFEROL 4 MCG/2ML IV SOLN
INTRAVENOUS | Status: AC
  Administered 2019-01-03: 4 ug via INTRAVENOUS
  Filled 2019-01-03: qty 2

## 2019-01-03 MED ORDER — HYDROXYZINE HCL 25 MG PO TABS
75.0000 mg | ORAL_TABLET | Freq: Three times a day (TID) | ORAL | Status: DC
  Administered 2019-01-03 – 2019-01-05 (×5): 75 mg via ORAL
  Filled 2019-01-03 (×6): qty 3

## 2019-01-03 NOTE — Progress Notes (Signed)
Patient is out of room. Reviewed PT notes and agree patient would be a good candidate for Inpatient rehab. Will need appointment with Dr. Sharol Given 1 week after discharge.

## 2019-01-03 NOTE — Progress Notes (Addendum)
Inpatient Rehab Admissions Coordinator:   Met with pt in dialysis.  He is on the phone and declined an in depth conversation but did state he was agreeable to rehab and gave permission to open insurance auth.  I will follow up this afternoon to confirm dispo for possible admission today/tomorrow pending insurance authorization and bed available.    Addendum: I was able to f/u with pt and his daughter. Pt lives with her and she can be with him 24/7.  Will f/u for possible admission pending insurance approval.   Shann Medal, PT, DPT Admissions Coordinator [phone] 01/03/19  10:34 AM

## 2019-01-03 NOTE — Progress Notes (Signed)
Physical Therapy Treatment Patient Details Name: Jahleel Stroschein MRN: [id number] DOB: October 10, 1960 Today's Date: 01/03/2019    History of Present Illness 58 yo male with onset of RLE cellulitis and pain was admitted, has R foot wound with findings of ulceration dorsal to talonavicular joint, over tib anterior tendon without tenosynovitis, no osteomyelitis, achilles tendinosis with no tear, and potential denervation vs myositis from IM edema from intrinsics of foot. s/p R BKA on 12/20. PMHx:  L AK amputation, DM, HTN, lacunar stroke, CHF, CAD, CABG x 3.    PT Comments    Pt with improved affect today, agreeable to EOB mobility and LE exercises s/p amputation. Pt tolerated EOB lateral scooting x2 bed lengths, limited by residual limb pain and fatigue. Pt is eager to d/c from acute setting and transition to CIR. PT to continue to follow acutely.    Follow Up Recommendations  CIR     Equipment Recommendations  None recommended by PT    Recommendations for Other Services Rehab consult     Precautions / Restrictions Precautions Precautions: Fall Restrictions Weight Bearing Restrictions: Yes RLE Weight Bearing: Non weight bearing LLE Weight Bearing: Non weight bearing    Mobility  Bed Mobility Overal bed mobility: Needs Assistance Bed Mobility: Supine to Sit;Sit to Supine;Rolling Rolling: Min guard   Supine to sit: Min guard Sit to supine: Min guard;HOB elevated   General bed mobility comments: min guard for supine<>sit for safety, increased time to come to and from EOB. Pt prefers to come to long sitting and power through arms to get to EOB, vs rolling and sidelying to sit.  Transfers                 General transfer comment: deferred  Ambulation/Gait                 Stairs             Wheelchair Mobility    Modified Rankin (Stroke Patients Only)       Balance Overall balance assessment: Needs assistance Sitting-balance support: No upper  extremity supported Sitting balance-Leahy Scale: Fair Sitting balance - Comments: supervision for scooting EOB       Standing balance comment: not assessed this session                            Cognition Arousal/Alertness: Awake/alert Behavior During Therapy: Restless Overall Cognitive Status: Within Functional Limits for tasks assessed                             Awareness: Emergent Problem Solving: Requires verbal cues;Difficulty sequencing;Requires tactile cues General Comments: patient pleasant and cooperative, oriented; highly distracted by itching during session      Exercises Amputee Exercises Hip Extension: AROM;Right;10 reps;Supine Hip ABduction/ADduction: AROM;Right;10 reps;Supine Other Exercises Other Exercises: EOB lateral scooting x2 bed lengths, with use of elbow extension to power up trunk    General Comments        Pertinent Vitals/Pain Pain Assessment: Faces Faces Pain Scale: Hurts a little bit Pain Location: R residual limb, generalized itching  Pain Descriptors / Indicators: Discomfort;Grimacing;Other (Comment)(severe systemic itching) Pain Intervention(s): Limited activity within patient's tolerance;Monitored during session;Repositioned    Home Living                      Prior Function  PT Goals (current goals can now be found in the care plan section) Acute Rehab PT Goals Patient Stated Goal: to get to hospital rehab PT Goal Formulation: With patient Time For Goal Achievement: 01/13/19 Potential to Achieve Goals: Good Progress towards PT goals: Progressing toward goals    Frequency    Min 3X/week      PT Plan Current plan remains appropriate    Co-evaluation              AM-PAC PT "6 Clicks" Mobility   Outcome Measure  Help needed turning from your back to your side while in a flat bed without using bedrails?: A Little Help needed moving from lying on your back to sitting on the  side of a flat bed without using bedrails?: A Little Help needed moving to and from a bed to a chair (including a wheelchair)?: A Little Help needed standing up from a chair using your arms (e.g., wheelchair or bedside chair)?: A Lot Help needed to walk in hospital room?: Total Help needed climbing 3-5 steps with a railing? : Total 6 Click Score: 13    End of Session   Activity Tolerance: Patient tolerated treatment well Patient left: in bed;with call bell/phone within reach;with bed alarm set Nurse Communication: Mobility status PT Visit Diagnosis: Muscle weakness (generalized) (M62.81);Pain;Other abnormalities of gait and mobility (R26.89) Pain - Right/Left: Right Pain - part of body: Leg;Ankle and joints of foot     Time: 1530-1555 PT Time Calculation (min) (ACUTE ONLY): 25 min  Charges:  $Therapeutic Exercise: 8-22 mins $Therapeutic Activity: 8-22 mins                    Jedd Schulenburg E, PT Acute Rehabilitation Services Pager [phone]  Office [phone]   Deagen Krass D Elonda Husky 01/03/2019, 4:50 PM

## 2019-01-03 NOTE — Progress Notes (Signed)
PROGRESS NOTE    Matthew Kline   RCV:[id number]  DOB: 1960/02/16  DOA: 12/29/2018 PCP: [facility]   Brief Narrative:  Matthew Kline 59 year old male, lives with family, reportedly ambulates with the help of left lower extremity prosthesis, PMH of ESRD due to diabetic nephropathy on MWF HD, DM 2 with peripheral neuropathy, HTN, HLD, CVA/lacunar stroke, CAD, s/p CABG, chronic diastolic CHF, PAD s/p left AKA and right great toe amputation, chronic foot ulcers, PMR with chronic pain, GERD, tobacco abuse presented to ED due to worsening of right chronic foot ulcer with associated drainage, redness and pain of 3 days duration.  He missed 2 days of dialysis due to the pain and difficulty to weight-bear.  In the ED, potassium 7, creatinine 9.94.  Admitted for RLE infected wound with cellulitis, possible abscess and osteomyelitis, hyperkalemia after missed dialysis.  Nephrology consulted and emergently underwent HD on day of admission with resolution of hyperkalemia.  Orthopedics/Dr. Sharol Given consulted.  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 Therapies evaluation pending. Hopefully appropriate for CIR.   Subjective: Is itching diffusely and states that this has been an issue for a few months now. No other complaints.     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 -  Waiting on CIR   - cont to control pain  Active Problems: Confusion - noted to be confused today- per RN, this is worse after IV Dilaudid- he is also receiving 10-15 mg of Oxycodone repeatedly since  - due to ESRD, his narcotic clearance is poor- I have reduced doses of narcotics to decrease his level of confusion and have ordered IV Toradol instead - Confusion has resolved   Diffuse itching - resume Hydroxyzine- he will need to follow up with and allergist after discharge    Essential hypertension - cont  Amlodipine, Carvedilol     End-stage renal disease on hemodialysis - SHPT - cont HD on MWF and cont Renvela    Diabetic polyneuropathy associated with type 2 diabetes mellitus  - cont Gabapentin   Moderate Malnutrition  - cont Nepro shake, Prostat   Time spent in minutes: 35 DVT prophylaxis: Heparin Code Status: Full code Family Communication:  Disposition Plan: awaiting CIR vs SNF Consultants:   Ortho  Nephrology  Procedures:   Right transtibial amputation Antimicrobials:  Anti-infectives (From admission, onward)   Start     Dose/Rate Route Frequency Ordered Stop   12/31/18 1013  ceFAZolin (ANCEF) 2-4 GM/100ML-% IVPB    Note to Pharmacy: Bobbie Stack   : cabinet override      12/31/18 1013 12/31/18 1122   12/31/18 1000  ceFAZolin (ANCEF) IVPB 2g/100 mL premix     2 g 200 mL/hr over 30 Minutes Intravenous On call to O.R. 12/31/18 0956 12/31/18 1122   12/30/18 1900  ceFEPIme (MAXIPIME) 1 g in sodium chloride 0.9 % 100 mL IVPB  Status:  Discontinued     1 g 200 mL/hr over 30 Minutes Intravenous Every 24 hours 12/30/18 1314 01/01/19 1019   12/30/18 1400  metroNIDAZOLE (FLAGYL) tablet 500 mg     500 mg Oral Every 8 hours 12/30/18 1251 01/01/19 1400   12/30/18 0600  piperacillin-tazobactam (ZOSYN) IVPB 2.25 g  Status:  Discontinued     2.25 g 100 mL/hr over 30 Minutes Intravenous Every 8 hours 12/29/18 1947 12/30/18 1251   12/29/18 2200  piperacillin-tazobactam (ZOSYN) IVPB 2.25 g  Status:  Discontinued     2.25 g 100 mL/hr over 30 Minutes Intravenous Every 8 hours 12/29/18 1932 12/29/18 1947   12/29/18 2200  piperacillin-tazobactam (ZOSYN) IVPB 3.375 g     3.375 g 100 mL/hr over 30 Minutes Intravenous  Once 12/29/18 1947 12/30/18 0317   12/29/18 1830  ceFAZolin (ANCEF) IVPB 1 g/50 mL premix  Status:  Discontinued     1 g 100 mL/hr over 30 Minutes Intravenous  Once 12/29/18 1818 12/29/18 1932       Objective: Vitals:   01/03/19 0900 01/03/19 0933 01/03/19 1003  01/03/19 1032  BP: (!) 148/77 (!) 146/79 (!) 153/89 (!) 157/80  Pulse: 72 72 74 79  Resp: 13 13 13 16   Temp:    98.5 F (36.9 C)  TempSrc:    Oral  SpO2:    98%  Weight:      Height:        Intake/Output Summary (Last 24 hours) at 01/03/2019 1419 Last data filed at 01/03/2019 1032 Gross per 24 hour  Intake 60 ml  Output 1000 ml  Net -940 ml   Filed Weights   01/01/19 2028 01/02/19 2252 01/03/19 0647  Weight: 69.8 kg 68.3 kg 64.3 kg    Examination: General exam: Appears comfortable  HEENT: PERRLA, oral mucosa moist, no sclera icterus or thrush Respiratory system: Clear to auscultation. Respiratory effort normal. Cardiovascular system: S1 & S2 heard, RRR.   Gastrointestinal system: Abdomen soft, non-tender, nondistended. Normal bowel sounds. Central nervous system: Alert and oriented to place and person. No focal neurological deficits. Extremities: No cyanosis, clubbing or edema Skin: No rashes or ulcers Psychiatry:  Agitated and confused to situation    Data Reviewed: I have personally reviewed following labs and imaging studies  CBC: Recent Labs  Lab 12/29/18 1244 12/31/18 1047 01/01/19 1316 01/02/19 0458  WBC 8.9  --  8.5 9.0  NEUTROABS 7.0  --   --   --   HGB 10.3* 11.2* 9.5* 10.1*  HCT 32.1* 33.0* 29.8* 31.9*  MCV 93.9  --  92.8 94.4  PLT 191  --  196 175   Basic Metabolic Panel: Recent Labs  Lab 12/29/18 1244 12/30/18 0223 12/31/18 1047 01/01/19 1316  NA 138 136 137 136  K 7.0* 4.1 4.4 5.3*  CL 95* 95* 98 96*  CO2 24 27  --  24  GLUCOSE 125* 153* 94 129*  BUN 53* 14 32* 42*  CREATININE 9.94* 4.50* 6.50* 8.54*  CALCIUM 9.6 9.3  --  9.1  PHOS  --   --   --  8.1*   GFR: Estimated Creatinine Clearance: 8.5 mL/min (A) (by C-G formula based on SCr of 8.54 mg/dL (H)). Liver Function Tests: Recent Labs  Lab 12/29/18 1244 01/01/19 1316  AST 84*  --   ALT 51*  --   ALKPHOS 143*  --   BILITOT 1.1  --   PROT 8.0  --   ALBUMIN 3.0* 2.8*   No  results for input(s): LIPASE, AMYLASE in the last 168 hours. No results for input(s): AMMONIA in the last 168 hours. Coagulation Profile: No results for input(s): INR, PROTIME in the last 168 hours. Cardiac Enzymes: No results for input(s): CKTOTAL, CKMB, CKMBINDEX, TROPONINI in the last 168 hours. BNP (last 3 results) No results for input(s): PROBNP in the last 8760 hours. HbA1C: No results for input(s): HGBA1C in the last 72 hours. CBG: Recent Labs  Lab 01/01/19 2031 01/02/19 0658 01/02/19 1147  GLUCAP 126*  87 89   Lipid Profile: No results for input(s): CHOL, HDL, LDLCALC, TRIG, CHOLHDL, LDLDIRECT in the last 72 hours. Thyroid Function Tests: No results for input(s): TSH, T4TOTAL, FREET4, T3FREE, THYROIDAB in the last 72 hours. Anemia Panel: No results for input(s): VITAMINB12, FOLATE, FERRITIN, TIBC, IRON, RETICCTPCT in the last 72 hours. Urine analysis:    Component Value Date/Time   COLORURINE YELLOW 01/29/2017 1141   APPEARANCEUR CLOUDY (A) 01/29/2017 1141   APPEARANCEUR Clear 07/12/2013 0918   LABSPEC 1.011 01/29/2017 1141   PHURINE 8.0 01/29/2017 1141   GLUCOSEU 50 (A) 01/29/2017 1141   HGBUR SMALL (A) 01/29/2017 1141   BILIRUBINUR NEGATIVE 01/29/2017 1141   BILIRUBINUR Negative 07/12/2013 0918   KETONESUR NEGATIVE 01/29/2017 1141   PROTEINUR 100 (A) 01/29/2017 1141   UROBILINOGEN 0.2 01/03/2012 1335   NITRITE NEGATIVE 01/29/2017 1141   LEUKOCYTESUR LARGE (A) 01/29/2017 1141   LEUKOCYTESUR Negative 07/12/2013 0918   Sepsis Labs: @LABRCNTIP (procalcitonin:4,lacticidven:4) ) Recent Results (from the past 240 hour(s))  Respiratory Panel by RT PCR (Flu A&B, Covid) - Nasopharyngeal Swab     Status: None   Collection Time: 12/29/18  4:50 PM   Specimen: Nasopharyngeal Swab  Result Value Ref Range Status   SARS Coronavirus 2 by RT PCR NEGATIVE NEGATIVE Final    Comment: (NOTE) SARS-CoV-2 target nucleic acids are NOT DETECTED. The SARS-CoV-2 RNA is generally  detectable in upper respiratoy specimens during the acute phase of infection. The lowest concentration of SARS-CoV-2 viral copies this assay can detect is 131 copies/mL. A negative result does not preclude SARS-Cov-2 infection and should not be used as the sole basis for treatment or other patient management decisions. A negative result may occur with  improper specimen collection/handling, submission of specimen other than nasopharyngeal swab, presence of viral mutation(s) within the areas targeted by this assay, and inadequate number of viral copies (<131 copies/mL). A negative result must be combined with clinical observations, patient history, and epidemiological information. The expected result is Negative. Fact Sheet for Patients:  PinkCheek.be Fact Sheet for Healthcare Providers:  GravelBags.it This test is not yet ap proved or cleared by the [place] FDA and  has been authorized for detection and/or diagnosis of SARS-CoV-2 by FDA under an Emergency Use Authorization (EUA). This EUA will remain  in effect (meaning this test can be used) for the duration of the COVID-19 declaration under Section 564(b)(1) of the Act, 21 U.S.C. section 360bbb-3(b)(1), unless the authorization is terminated or revoked sooner.    Influenza A by PCR NEGATIVE NEGATIVE Final   Influenza B by PCR NEGATIVE NEGATIVE Final    Comment: (NOTE) The Xpert Xpress SARS-CoV-2/FLU/RSV assay is intended as an aid in  the diagnosis of influenza from Nasopharyngeal swab specimens and  should not be used as a sole basis for treatment. Nasal washings and  aspirates are unacceptable for Xpert Xpress SARS-CoV-2/FLU/RSV  testing. Fact Sheet for Patients: PinkCheek.be Fact Sheet for Healthcare Providers: GravelBags.it This test is not yet approved or cleared by the [place] FDA and  has been  authorized for detection and/or diagnosis of SARS-CoV-2 by  FDA under an Emergency Use Authorization (EUA). This EUA will remain  in effect (meaning this test can be used) for the duration of the  Covid-19 declaration under Section 564(b)(1) of the Act, 21  U.S.C. section 360bbb-3(b)(1), unless the authorization is  terminated or revoked. Performed at [facility], [place] [address] [place], [place] [address]   MRSA PCR Screening  Status: None   Collection Time: 12/30/18  1:53 AM   Specimen: Nasal Mucosa; Nasopharyngeal  Result Value Ref Range Status   MRSA by PCR NEGATIVE NEGATIVE Final    Comment:        The GeneXpert MRSA Assay (FDA approved for NASAL specimens only), is one component of a comprehensive MRSA colonization surveillance program. It is not intended to diagnose MRSA infection nor to guide or monitor treatment for MRSA infections. Performed at [facility], [place] [address] [place], [place] [address]   Surgical PCR screen     Status: None   Collection Time: 12/31/18 10:22 AM   Specimen: Nasal Mucosa; Nasal Swab  Result Value Ref Range Status   MRSA, PCR NEGATIVE NEGATIVE Final   Staphylococcus aureus NEGATIVE NEGATIVE Final    Comment: (NOTE) The Xpert SA Assay (FDA approved for NASAL specimens in patients 70 years of age and older), is one component of a comprehensive surveillance program. It is not intended to diagnose infection nor to guide or monitor treatment. Performed at [facility], [place] [address] [place],  [address]          Radiology Studies: No results found.    Scheduled Meds: . amLODipine  5 mg Oral QPM  . aspirin EC  81 mg Oral QHS  . carvedilol  12.5 mg Oral BID WC  . Chlorhexidine Gluconate Cloth  6 each Topical Q0600  . darbepoetin (ARANESP) injection - DIALYSIS  60 mcg Intravenous Q Wed-HD  . docusate sodium  100 mg Oral BID  . doxercalciferol  4 mcg Intravenous Q M,W,F-HD  . feeding supplement  (NEPRO CARB STEADY)  237 mL Oral TID BM  . feeding supplement (PRO-STAT SUGAR FREE 64)  30 mL Oral BID  . gabapentin  200 mg Oral Daily  . heparin  5,000 Units Subcutaneous Q8H  . hydrOXYzine  75 mg Oral TID  . ketorolac  30 mg Intravenous Q6H  . montelukast  10 mg Oral QHS  . multivitamin  1 tablet Oral QHS  . mupirocin ointment  1 application Nasal BID  . sevelamer carbonate  3,200 mg Oral TID WC  . temazepam  30 mg Oral QHS   Continuous Infusions: . sodium chloride 250 mL (12/30/18 0126)  . sodium chloride    . methocarbamol (ROBAXIN) IV       LOS: 5 days      Debbe Odea, MD [facility] Pager: [email or web address] Password [facility] 01/03/2019, 2:19 PM

## 2019-01-03 NOTE — Progress Notes (Addendum)
I have seen and examined this patient and agree with the plan of care    Matthew Kline 01/03/2019, 12:55 PM  [facility] Progress Note   Dialysis Orders: MWF - NW 4hrs, BFR400, GUY403, EDW 65.5kg,2K/2Ca Access:LU AVF Heparin4000 unit bolus Mircera11mcg q2wks - last 12/2 Hectorol63mcg IV qHD   Assessment/Plan: 1. Hyperkalemia -Resolved with dialysis. 2. S/p right  BKA with VAC Dr. Sharol Given. -still on flagyl 3. ESRD -  MWF.  run 3.5 hours due to high inpatient HD census - tight heparin - 2 K  [place] - next HD Saturday due to holiday schedule 4. Hypertension/volume - volume ok - new EDW for d/c due to surgery - continue current meds net UF 1.5 Monday 5. Anemia of CKD - Hgb 10.1 stable Monday - will give 60 Wed 6. Secondary Hyperparathyroidism -  Phos elevated Continue VDRA and binders..- use 2 Ca bath 7. Nutrition - Renal diet w/fluid restrictions. - Added nepro/renavites 8. Hx CAD 9. DM - per primary 10. Hx seizures 11. PAD s/p amputations  12. Hx anxiety  13. Confusion - suspect related to meds, better today- 14. Disp - would be good candidate for CIR  Myriam Jacobson, PA-C [place] 01/03/2019,7:50 AM  LOS: 5 days   Subjective:   See on HD . Calm. No c/o  Objective Vitals:   01/03/19 0438 01/03/19 0647 01/03/19 0700 01/03/19 0730  BP: 131/79 137/78 139/83 140/77  Pulse: 68 71  71  Resp: 12 12 13 13   Temp: 97.8 F (36.6 C) 97.7 F (36.5 C)    TempSrc: Oral Oral    SpO2: 94% 98%    Weight:  64.3 kg    Height:       Physical Exam goal 1.5 L General: NAD breathing easily on room air Heart: RRR Lungs: no rales Abdomen: soft NT Extremities: left AKA no edema - right BKA with VAC Dialysis Access: left upper AVF + bruit   Additional Objective Labs: Basic Metabolic Panel: Recent Labs  Lab 12/29/18 1244 12/30/18 0223 12/31/18 1047 01/01/19 1316  NA 138 136 137 136  K 7.0* 4.1 4.4 5.3*   CL 95* 95* 98 96*  CO2 24 27  --  24  GLUCOSE 125* 153* 94 129*  BUN 53* 14 32* 42*  CREATININE 9.94* 4.50* 6.50* 8.54*  CALCIUM 9.6 9.3  --  9.1  PHOS  --   --   --  8.1*   Liver Function Tests: Recent Labs  Lab 12/29/18 1244 01/01/19 1316  AST 84*  --   ALT 51*  --   ALKPHOS 143*  --   BILITOT 1.1  --   PROT 8.0  --   ALBUMIN 3.0* 2.8*   No results for input(s): LIPASE, AMYLASE in the last 168 hours. CBC: Recent Labs  Lab 12/29/18 1244 12/31/18 1047 01/01/19 1316 01/02/19 0458  WBC 8.9  --  8.5 9.0  NEUTROABS 7.0  --   --   --   HGB 10.3* 11.2* 9.5* 10.1*  HCT 32.1* 33.0* 29.8* 31.9*  MCV 93.9  --  92.8 94.4  PLT 191  --  196 187   Blood Culture    Component Value Date/Time   SDES BLOOD RIGHT WRIST 11/24/2018 1432   SPECREQUEST  11/24/2018 1432    BOTTLES DRAWN AEROBIC AND ANAEROBIC Blood Culture adequate volume   CULT  11/24/2018 1432    NO GROWTH 5 DAYS Performed at [facility], [address] [place], [place] [address]    REPTSTATUS 11/29/2018 FINAL 11/24/2018 1432    Cardiac Enzymes: No results for input(s): CKTOTAL, CKMB, CKMBINDEX, TROPONINI in the last 168 hours. CBG: Recent Labs  Lab 01/01/19 2031 01/02/19 0658 01/02/19 1147  GLUCAP 126* 87 89   Iron Studies: No results for input(s): IRON, TIBC, TRANSFERRIN, FERRITIN in the last 72 hours. Lab Results  Component Value Date   INR 1.2 11/24/2018   INR 1.30 11/08/2016   INR 1.2 08/17/2016   Studies/Results: No results found. Medications: . sodium chloride 250 mL (12/30/18 0126)  . sodium chloride    . methocarbamol (ROBAXIN) IV     . amLODipine  5 mg Oral QPM  . aspirin EC  81 mg Oral QHS  . carvedilol  12.5 mg Oral BID WC  . Chlorhexidine Gluconate Cloth  6 each Topical Q0600  . darbepoetin (ARANESP) injection - DIALYSIS  60 mcg Intravenous Q Wed-HD  . docusate sodium  100 mg Oral BID  . doxercalciferol  4 mcg Intravenous Q M,W,F-HD  . feeding supplement (NEPRO CARB  STEADY)  237 mL Oral TID BM  . feeding supplement (PRO-STAT SUGAR FREE 64)  30 mL Oral BID  . gabapentin  200 mg Oral Daily  . heparin  5,000 Units Subcutaneous Q8H  . ketorolac  30 mg Intravenous Q6H  . montelukast  10 mg Oral QHS  . multivitamin  1 tablet Oral QHS  . mupirocin ointment  1 application Nasal BID  . sevelamer carbonate  3,200 mg Oral TID WC  . temazepam  30 mg Oral QHS

## 2019-01-04 MED ORDER — CHLORHEXIDINE GLUCONATE CLOTH 2 % EX PADS
6.0000 | MEDICATED_PAD | Freq: Every day | CUTANEOUS | Status: DC
  Administered 2019-01-05: 6 via TOPICAL

## 2019-01-04 NOTE — Progress Notes (Signed)
[facility] Progress Note   Dialysis Orders: MWF - NW 4hrs, BFR400, [id number], EDW 65.5kg,2K/2Ca Access:LU AVF Heparin4000 unit bolus Mircera24mcg q2wks - last 12/2 Hectorol55mcg IV qHD   Assessment/Plan: 1. Hyperkalemia -Resolved with dialysis. 2. S/p right  BKA with VAC Dr. Sharol Given.  3. ESRD -  MWF.  running 3.5 hours due to high inpatient HD census - tight heparin - 2 K  [place] - next HD Saturday due to holiday schedule/orders written today - needs labs 4. Hypertension/volume - volume ok - new EDW for d/c due to surgery - continue current meds net UF 1.5 Monday 1 L Wednesday - continue to lower volume - crackles on exam today 5. Anemia of CKD - Hgb 10.1 stable Aranesp 60/week 6. Secondary Hyperparathyroidism -  Phos elevated Continue VDRA and binders..- use 2 Ca bath 7. Nutrition - Renal diet w/fluid restrictions. - Added nepro/renavites 8. Hx CAD 9. DM - per primary 10. Hx seizures 11. PAD s/p amputations  12. Hx anxiety  13. Confusion - suspect related to meds, better today seems to vary 14. Disp - would be good candidate for CIR  Myriam Jacobson, PA-C [place] [phone] 01/04/2019,9:41 AM  LOS: 6 days   Subjective:   Doesn't know d/c plan . Ongoing issues with itching - sarna and atarax help some.   Objective Vitals:   01/03/19 1726 01/03/19 2119 01/04/19 0437 01/04/19 0852  BP: 130/81 117/65 129/77 129/84  Pulse: 68 61 65 69  Resp: 18 16 16 16   Temp: 98.6 F (37 C) 98.6 F (37 C) 98 F (36.7 C)   TempSrc: Oral Oral Oral Oral  SpO2: 94% 98% 91% 98%  Weight:  62.9 kg    Height:       Physical Exam goal 1.5 L General: NAD breathing easily on room air sitting up in bed Heart: RRR Lungs: bilateral crackles about 1/3 up Abdomen: soft NT Extremities: left AKA no edema - right BKA with VAC Dialysis Access: left upper AVF + bruit   Additional Objective Labs: Basic Metabolic Panel: Recent Labs   Lab 12/29/18 1244 12/30/18 0223 12/31/18 1047 01/01/19 1316  NA 138 136 137 136  K 7.0* 4.1 4.4 5.3*  CL 95* 95* 98 96*  CO2 24 27  --  24  GLUCOSE 125* 153* 94 129*  BUN 53* 14 32* 42*  CREATININE 9.94* 4.50* 6.50* 8.54*  CALCIUM 9.6 9.3  --  9.1  PHOS  --   --   --  8.1*   Liver Function Tests: Recent Labs  Lab 12/29/18 1244 01/01/19 1316  AST 84*  --   ALT 51*  --   ALKPHOS 143*  --   BILITOT 1.1  --   PROT 8.0  --   ALBUMIN 3.0* 2.8*   No results for input(s): LIPASE, AMYLASE in the last 168 hours. CBC: Recent Labs  Lab 12/29/18 1244 12/31/18 1047 01/01/19 1316 01/02/19 0458  WBC 8.9  --  8.5 9.0  NEUTROABS 7.0  --   --   --   HGB 10.3* 11.2* 9.5* 10.1*  HCT 32.1* 33.0* 29.8* 31.9*  MCV 93.9  --  92.8 94.4  PLT 191  --  196 187   Blood Culture    Component Value Date/Time   SDES BLOOD RIGHT WRIST 11/24/2018 1432   SPECREQUEST  11/24/2018 1432    BOTTLES DRAWN AEROBIC AND ANAEROBIC Blood Culture adequate volume   CULT  11/24/2018 1432    NO  GROWTH 5 DAYS Performed at [facility], [place] [address] [place], [place] [address]    REPTSTATUS 11/29/2018 FINAL 11/24/2018 1432    Cardiac Enzymes: No results for input(s): CKTOTAL, CKMB, CKMBINDEX, TROPONINI in the last 168 hours. CBG: Recent Labs  Lab 01/01/19 2031 01/02/19 0658 01/02/19 1147  GLUCAP 126* 87 89   Iron Studies: No results for input(s): IRON, TIBC, TRANSFERRIN, FERRITIN in the last 72 hours. Lab Results  Component Value Date   INR 1.2 11/24/2018   INR 1.30 11/08/2016   INR 1.2 08/17/2016   Studies/Results: No results found. Medications: . sodium chloride 250 mL (12/30/18 0126)  . sodium chloride    . methocarbamol (ROBAXIN) IV     . amLODipine  5 mg Oral QPM  . aspirin EC  81 mg Oral QHS  . carvedilol  12.5 mg Oral BID WC  . Chlorhexidine Gluconate Cloth  6 each Topical Q0600  . darbepoetin (ARANESP) injection - DIALYSIS  60 mcg Intravenous Q Wed-HD  . docusate  sodium  100 mg Oral BID  . doxercalciferol  4 mcg Intravenous Q M,W,F-HD  . feeding supplement (NEPRO CARB STEADY)  237 mL Oral TID BM  . feeding supplement (PRO-STAT SUGAR FREE 64)  30 mL Oral BID  . gabapentin  200 mg Oral Daily  . heparin  5,000 Units Subcutaneous Q8H  . hydrOXYzine  75 mg Oral TID  . ketorolac  30 mg Intravenous Q6H  . montelukast  10 mg Oral QHS  . multivitamin  1 tablet Oral QHS  . mupirocin ointment  1 application Nasal BID  . sevelamer carbonate  3,200 mg Oral TID WC  . temazepam  30 mg Oral QHS

## 2019-01-04 NOTE — Progress Notes (Signed)
PROGRESS NOTE    Matthew Kline   IRC:[id number]  DOB: Oct 02, 1960  DOA: 12/29/2018 [facility]CP: [facility]   Brief Narrative:  Matthew Kline 58 year old male, lives with family, reportedly ambulates with the help of left lower extremity prosthesis, PMH of ESRD due to diabetic nephropathy on MWF HD, DM 2 with peripheral neuropathy, HTN, HLD, CVA/lacunar stroke, CAD, s/p CABG, chronic diastolic CHF, PAD s/p left AKA and right great toe amputation, chronic foot ulcers, PMR with chronic pain, GERD, tobacco abuse presented to ED due to worsening of right chronic foot ulcer with associated drainage, redness and pain of 3 days duration.  He missed 2 days of dialysis due to the pain and difficulty to weight-bear.  In the ED, potassium 7, creatinine 9.94.  Admitted for RLE infected wound with cellulitis, possible abscess and osteomyelitis, hyperkalemia after missed dialysis.  Nephrology consulted and emergently underwent HD on day of admission with resolution of hyperkalemia.  Orthopedics/Dr. Sharol Given consulted.  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 Therapies evaluation pending. Hopefully appropriate for CIR.   Subjective: Hydroxyzine is helping his itching. He has no new complaints.     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 -  Waiting on CIR   - cont to control pain  Active Problems: Confusion - noted to be confused on 12/22 - per RN, this was worse after IV Dilaudid- he was also receiving 10-15 mg of Oxycodone repeatedly since  - due to ESRD, his narcotic clearance is poor- I have reduced doses of narcotics to decrease his level of confusion and have ordered IV Toradol instead - Confusion has resolved   Diffuse itching -   Hydroxyzine is helping control it some but it has not resolved -  he will need to follow up with and allergist after discharge    Essential  hypertension - cont Amlodipine, Carvedilol     End-stage renal disease on hemodialysis - SHPT - cont HD on MWF and cont Renvela    Diabetic polyneuropathy associated with type 2 diabetes mellitus  - cont Gabapentin   Moderate Malnutrition  - cont Nepro shake, Prostat   Time spent in minutes: 35 DVT prophylaxis: Heparin Code Status: Full code Family Communication:  Disposition Plan: awaiting CIR vs SNF - P2P completed with [employer] today- they have denied CIR Consultants:   Ortho  Nephrology  Procedures:   Right transtibial amputation Antimicrobials:  Anti-infectives (From admission, onward)   Start     Dose/Rate Route Frequency Ordered Stop   12/31/18 1013  ceFAZolin (ANCEF) 2-4 GM/100ML-% IVPB    Note to Pharmacy: Bobbie Stack   : cabinet override      12/31/18 1013 12/31/18 1122   12/31/18 1000  ceFAZolin (ANCEF) IVPB 2g/100 mL premix     2 g 200 mL/hr over 30 Minutes Intravenous On call to O.R. 12/31/18 0956 12/31/18 1122   12/30/18 1900  ceFEPIme (MAXIPIME) 1 g in sodium chloride 0.9 % 100 mL IVPB  Status:  Discontinued     1 g 200 mL/hr over 30 Minutes Intravenous Every 24 hours 12/30/18 1314 01/01/19 1019   12/30/18 1400  metroNIDAZOLE (FLAGYL) tablet 500 mg     500 mg Oral Every 8 hours 12/30/18 1251 01/01/19 1400   12/30/18 0600  piperacillin-tazobactam (ZOSYN) IVPB 2.25 g  Status:  Discontinued     2.25 g 100 mL/hr over 30 Minutes Intravenous Every  8 hours 12/29/18 1947 12/30/18 1251   12/29/18 2200  piperacillin-tazobactam (ZOSYN) IVPB 2.25 g  Status:  Discontinued     2.25 g 100 mL/hr over 30 Minutes Intravenous Every 8 hours 12/29/18 1932 12/29/18 1947   12/29/18 2200  piperacillin-tazobactam (ZOSYN) IVPB 3.375 g     3.375 g 100 mL/hr over 30 Minutes Intravenous  Once 12/29/18 1947 12/30/18 0317   12/29/18 1830  ceFAZolin (ANCEF) IVPB 1 g/50 mL premix  Status:  Discontinued     1 g 100 mL/hr over 30 Minutes Intravenous  Once 12/29/18 1818 12/29/18 1932         Objective: Vitals:   01/03/19 1726 01/03/19 2119 01/04/19 0437 01/04/19 0852  BP: 130/81 117/65 129/77 129/84  Pulse: 68 61 65 69  Resp: 18 16 16 16   Temp: 98.6 F (37 C) 98.6 F (37 C) 98 F (36.7 C)   TempSrc: Oral Oral Oral Oral  SpO2: 94% 98% 91% 98%  Weight:  62.9 kg    Height:        Intake/Output Summary (Last 24 hours) at 01/04/2019 0953 Last data filed at 01/04/2019 0600 Gross per 24 hour  Intake 720 ml  Output 1000 ml  Net -280 ml   Filed Weights   01/02/19 2252 01/03/19 0647 01/03/19 2119  Weight: 68.3 kg 64.3 kg 62.9 kg    Examination: General exam: Appears comfortable  HEENT: PERRLA, oral mucosa moist, no sclera icterus or thrush Respiratory system: Clear to auscultation. Respiratory effort normal. Cardiovascular system: S1 & S2 heard,  No murmurs  Gastrointestinal system: Abdomen soft, non-tender, nondistended. Normal bowel sounds   Central nervous system: Alert and oriented. No focal neurological deficits. Extremities: No cyanosis, clubbing or edema- left AKA and right BKA noted. Skin: No rashes or ulcers Psychiatry:  Mood & affect appropriate.     Data Reviewed: I have personally reviewed following labs and imaging studies  CBC: Recent Labs  Lab 12/29/18 1244 12/31/18 1047 01/01/19 1316 01/02/19 0458  WBC 8.9  --  8.5 9.0  NEUTROABS 7.0  --   --   --   HGB 10.3* 11.2* 9.5* 10.1*  HCT 32.1* 33.0* 29.8* 31.9*  MCV 93.9  --  92.8 94.4  PLT 191  --  196 010   Basic Metabolic Panel: Recent Labs  Lab 12/29/18 1244 12/30/18 0223 12/31/18 1047 01/01/19 1316  NA 138 136 137 136  K 7.0* 4.1 4.4 5.3*  CL 95* 95* 98 96*  CO2 24 27  --  24  GLUCOSE 125* 153* 94 129*  BUN 53* 14 32* 42*  CREATININE 9.94* 4.50* 6.50* 8.54*  CALCIUM 9.6 9.3  --  9.1  PHOS  --   --   --  8.1*   GFR: Estimated Creatinine Clearance: 8.3 mL/min (A) (by C-G formula based on SCr of 8.54 mg/dL (H)). Liver Function Tests: Recent Labs  Lab 12/29/18 1244  01/01/19 1316  AST 84*  --   ALT 51*  --   ALKPHOS 143*  --   BILITOT 1.1  --   PROT 8.0  --   ALBUMIN 3.0* 2.8*   No results for input(s): LIPASE, AMYLASE in the last 168 hours. No results for input(s): AMMONIA in the last 168 hours. Coagulation Profile: No results for input(s): INR, PROTIME in the last 168 hours. Cardiac Enzymes: No results for input(s): CKTOTAL, CKMB, CKMBINDEX, TROPONINI in the last 168 hours. BNP (last 3 results) No results for input(s): PROBNP in the last 8760  hours. HbA1C: No results for input(s): HGBA1C in the last 72 hours. CBG: Recent Labs  Lab 01/01/19 2031 01/02/19 0658 01/02/19 1147  GLUCAP 126* 87 89   Lipid Profile: No results for input(s): CHOL, HDL, LDLCALC, TRIG, CHOLHDL, LDLDIRECT in the last 72 hours. Thyroid Function Tests: No results for input(s): TSH, T4TOTAL, FREET4, T3FREE, THYROIDAB in the last 72 hours. Anemia Panel: No results for input(s): VITAMINB12, FOLATE, FERRITIN, TIBC, IRON, RETICCTPCT in the last 72 hours. Urine analysis:    Component Value Date/Time   COLORURINE YELLOW 01/29/2017 1141   APPEARANCEUR CLOUDY (A) 01/29/2017 1141   APPEARANCEUR Clear 07/12/2013 0918   LABSPEC 1.011 01/29/2017 1141   PHURINE 8.0 01/29/2017 1141   GLUCOSEU 50 (A) 01/29/2017 1141   HGBUR SMALL (A) 01/29/2017 1141   BILIRUBINUR NEGATIVE 01/29/2017 1141   BILIRUBINUR Negative 07/12/2013 0918   KETONESUR NEGATIVE 01/29/2017 1141   PROTEINUR 100 (A) 01/29/2017 1141   UROBILINOGEN 0.2 01/03/2012 1335   NITRITE NEGATIVE 01/29/2017 1141   LEUKOCYTESUR LARGE (A) 01/29/2017 1141   LEUKOCYTESUR Negative 07/12/2013 0918   Sepsis Labs: @LABRCNTIP (procalcitonin:4,lacticidven:4) ) Recent Results (from the past 240 hour(s))  Respiratory Panel by RT PCR (Flu A&B, Covid) - Nasopharyngeal Swab     Status: None   Collection Time: 12/29/18  4:50 PM   Specimen: Nasopharyngeal Swab  Result Value Ref Range Status   SARS Coronavirus 2 by RT PCR  NEGATIVE NEGATIVE Final    Comment: (NOTE) SARS-CoV-2 target nucleic acids are NOT DETECTED. The SARS-CoV-2 RNA is generally detectable in upper respiratoy specimens during the acute phase of infection. The lowest concentration of SARS-CoV-2 viral copies this assay can detect is 131 copies/mL. A negative result does not preclude SARS-Cov-2 infection and should not be used as the sole basis for treatment or other patient management decisions. A negative result may occur with  improper specimen collection/handling, submission of specimen other than nasopharyngeal swab, presence of viral mutation(s) within the areas targeted by this assay, and inadequate number of viral copies (<131 copies/mL). A negative result must be combined with clinical observations, patient history, and epidemiological information. The expected result is Negative. Fact Sheet for Patients:  PinkCheek.be Fact Sheet for Healthcare Providers:  GravelBags.it This test is not yet ap proved or cleared by the [place] FDA and  has been authorized for detection and/or diagnosis of SARS-CoV-2 by FDA under an Emergency Use Authorization (EUA). This EUA will remain  in effect (meaning this test can be used) for the duration of the COVID-19 declaration under Section 564(b)(1) of the Act, 21 U.S.C. section 360bbb-3(b)(1), unless the authorization is terminated or revoked sooner.    Influenza A by PCR NEGATIVE NEGATIVE Final   Influenza B by PCR NEGATIVE NEGATIVE Final    Comment: (NOTE) The Xpert Xpress SARS-CoV-2/FLU/RSV assay is intended as an aid in  the diagnosis of influenza from Nasopharyngeal swab specimens and  should not be used as a sole basis for treatment. Nasal washings and  aspirates are unacceptable for Xpert Xpress SARS-CoV-2/FLU/RSV  testing. Fact Sheet for Patients: PinkCheek.be Fact Sheet for Healthcare  Providers: GravelBags.it This test is not yet approved or cleared by the [place] FDA and  has been authorized for detection and/or diagnosis of SARS-CoV-2 by  FDA under an Emergency Use Authorization (EUA). This EUA will remain  in effect (meaning this test can be used) for the duration of the  Covid-19 declaration under Section 564(b)(1) of the Act, 21  U.S.C. section 360bbb-3(b)(1), unless the authorization  is  terminated or revoked. Performed at [facility], [place] [address], [place], [place] [address]   MRSA PCR Screening     Status: None   Collection Time: 12/30/18  1:53 AM   Specimen: Nasal Mucosa; Nasopharyngeal  Result Value Ref Range Status   MRSA by PCR NEGATIVE NEGATIVE Final    Comment:        The GeneXpert MRSA Assay (FDA approved for NASAL specimens only), is one component of a comprehensive MRSA colonization surveillance program. It is not intended to diagnose MRSA infection nor to guide or monitor treatment for MRSA infections. Performed at [facility], [place] [address], [place], [place] [address]   Surgical PCR screen     Status: None   Collection Time: 12/31/18 10:22 AM   Specimen: Nasal Mucosa; Nasal Swab  Result Value Ref Range Status   MRSA, PCR NEGATIVE NEGATIVE Final   Staphylococcus aureus NEGATIVE NEGATIVE Final    Comment: (NOTE) The Xpert SA Assay (FDA approved for NASAL specimens in patients 59 years of age and older), is one component of a comprehensive surveillance program. It is not intended to diagnose infection nor to guide or monitor treatment. Performed at [facility], [place] [address] [place], [place] [address]          Radiology Studies: No results found.    Scheduled Meds: . amLODipine  5 mg Oral QPM  . aspirin EC  81 mg Oral QHS  . carvedilol  12.5 mg Oral BID WC  . Chlorhexidine Gluconate Cloth  6 each Topical Q0600  . darbepoetin (ARANESP) injection - DIALYSIS  60 mcg  Intravenous Q Wed-HD  . docusate sodium  100 mg Oral BID  . doxercalciferol  4 mcg Intravenous Q M,W,F-HD  . feeding supplement (NEPRO CARB STEADY)  237 mL Oral TID BM  . feeding supplement (PRO-STAT SUGAR FREE 64)  30 mL Oral BID  . gabapentin  200 mg Oral Daily  . heparin  5,000 Units Subcutaneous Q8H  . hydrOXYzine  75 mg Oral TID  . ketorolac  30 mg Intravenous Q6H  . montelukast  10 mg Oral QHS  . multivitamin  1 tablet Oral QHS  . mupirocin ointment  1 application Nasal BID  . sevelamer carbonate  3,200 mg Oral TID WC  . temazepam  30 mg Oral QHS   Continuous Infusions: . sodium chloride 250 mL (12/30/18 0126)  . sodium chloride    . methocarbamol (ROBAXIN) IV       LOS: 6 days      Debbe Odea, MD [facility] Pager: [email or web address] Password [facility] 01/04/2019, 9:53 AM

## 2019-01-04 NOTE — Progress Notes (Signed)
Inpatient Rehab Admissions Coordinator:   Received notification that patient's insurance company has denied request for CIR auth.  Will let pt know and sign off.   Shann Medal, PT, DPT Admissions Coordinator [phone] 01/04/19  10:19 AM

## 2019-01-04 NOTE — Progress Notes (Signed)
     Subjective: 4 Days Post-Op Procedure(s) (LRB): AMPUTATION BELOW KNEE (Right) Application Of Wound Vac (Right) Awake, alert and oriented x 4. Right BKA dressing intact. I have seen previously with left sided surgery.  Patient reports pain as mild.    Objective:   VITALS:  Temp:  [97.5 F (36.4 C)-98.6 F (37 C)] 97.5 F (36.4 C) (12/24 1046) Pulse Rate:  [61-69] 69 (12/24 0852) Resp:  [16-18] 16 (12/24 0852) BP: (117-130)/(65-84) 129/84 (12/24 0852) SpO2:  [91 %-98 %] 98 % (12/24 0852) Weight:  [62.9 kg] 62.9 kg (12/23 2119)  Neurologically intact ABD soft Neurovascular intact Incision: dressing C/D/I, no drainage and VAC intact no drainage. POD #4, will keep VAC for 7 days, can be removed at home by [facility] or in office or if he is still here on 12/27 here.  Compartment soft   LABS Recent Labs    01/01/19 1316 01/02/19 0458  HGB 9.5* 10.1*  WBC 8.5 9.0  PLT 196 187   Recent Labs    01/01/19 1316  NA 136  K 5.3*  CL 96*  CO2 24  BUN 42*  CREATININE 8.54*  GLUCOSE 129*   No results for input(s): LABPT, INR in the last 72 hours.   Assessment/Plan: 4 Days Post-Op Procedure(s) (LRB): AMPUTATION BELOW KNEE (Right) Application Of Wound Vac (Right)  Advance diet Up with therapy Discharge home with home health  Basil Dess 01/04/2019, 12:03 PMPatient ID: Matthew Kline, male   DOB: 10-07-1960, 58 y.o.   MRN: [id number]

## 2019-01-04 NOTE — Plan of Care (Signed)
  Problem: Pain Managment: Goal: General experience of comfort will improve Outcome: Progressing   

## 2019-01-04 NOTE — TOC Initial Note (Signed)
Transition of Care [facility]) - Initial/Assessment Note    Patient Details  Name: Matthew Kline MRN: [id number] Date of Birth: [birth year]  [facility] CM/SW Contact:    Benard Halsted, LCSW Phone Number: 01/04/2019, 4:41 PM  Clinical Narrative:                 CSW received consult for possible home health services at time of discharge. Patient is refusing SNF placement CSW spoke with patient regarding PT recommendation of Home Health PT at time of discharge. Patient reported that he would like home health services. Patient reports preference for [facility]. CSW sent referral for review. CSW provided Medicare [place] ratings list. CSW discussed equipment needs with patient and he has a wheelchair. CSW confirmed PCP and address with patient. No further questions reported at this time. CSW to continue to follow and assist with discharge planning needs.   Expected Discharge Plan: [place] Barriers to Discharge: Continued Medical Work up   Patient Goals and CMS Choice Patient states their goals for this hospitalization and ongoing recovery are:: Return home CMS Medicare.gov Compare Post Acute Care list provided to:: Patient Choice offered to / list presented to : Patient  Expected Discharge Plan and Services Expected Discharge Plan: [place] In-house Referral: Clinical Social Work Discharge Planning Services: CM Consult Post Acute Care Choice: [place] arrangements for the past 2 months: Single Family Home                 DME Arranged: N/A         HH Arranged: RN, PT, OT          Prior Living Arrangements/Services Living arrangements for the past 2 months: Single Family Home Lives with:: Self Patient language and need for interpreter reviewed:: Yes Do you feel safe going back to the place where you live?: Yes      Need for Family Participation in Patient Care: No (Comment) Care giver support system in place?: Yes  (comment) Current home services: DME(Wheelchair) Criminal Activity/Legal Involvement Pertinent to Current Situation/Hospitalization: No - Comment as needed  Activities of Daily Living Home Assistive Devices/Equipment: Wheelchair, CBG Meter ADL Screening (condition at time of admission) Patient's cognitive ability adequate to safely complete daily activities?: Yes Is the patient deaf or have difficulty hearing?: Yes Does the patient have difficulty seeing, even when wearing glasses/contacts?: Yes Does the patient have difficulty concentrating, remembering, or making decisions?: No Patient able to express need for assistance with ADLs?: Yes Does the patient have difficulty dressing or bathing?: Yes Independently performs ADLs?: Yes (appropriate for developmental age) Does the patient have difficulty walking or climbing stairs?: Yes Weakness of Legs: Right Weakness of Arms/Hands: Both  Permission Sought/Granted Permission sought to share information with : Facility Sport and exercise psychologist, Family Supports Permission granted to share information with : Yes, Verbal Permission Granted     Permission granted to share info w AGENCY: Home health        Emotional Assessment Appearance:: Appears stated age Attitude/Demeanor/Rapport: Engaged Affect (typically observed): Accepting, Appropriate Orientation: : Oriented to Situation, Oriented to  Time, Oriented to Place, Oriented to Self Alcohol / Substance Use: Not Applicable Psych Involvement: No (comment)  Admission diagnosis:  Hyperkalemia [E87.5] Right leg pain [M79.604] Cellulitis and abscess of toe of right foot [L03.031, L02.611] Patient Active Problem List   Diagnosis Date Noted  . Abscess of tendon sheath, right ankle and foot   . Diabetic polyneuropathy  associated with type 2 diabetes mellitus ([place])   . Moderate protein-calorie malnutrition ([place])   . Cellulitis and abscess of toe of right foot 12/29/2018  . Cellulitis of scrotum  11/27/2018  . Sepsis ([place]) 11/24/2018  . Thrombocytopenia ([place]) 11/24/2018  . Bilateral inguinal hernia 11/21/2018  . Biliary colic 54/65/0354  . Acute cholecystitis 07/31/2018  . Prolonged QT interval 07/31/2018  . Acute respiratory failure with hypoxia ([place]) 07/10/2018  . Abdominal pain 07/10/2018  . Bilateral recurrent inguinal hernia without obstruction or gangrene   . Altered mental status   . Cerebral thrombosis with cerebral infarction 02/06/2017  . Cerebral embolism with cerebral infarction 02/06/2017  . Hx of AKA (above knee amputation), left ([place])   . Pressure injury of skin 01/30/2017  . Acute lower UTI 01/29/2017  . Acute metabolic encephalopathy 65/68/1275  . UTI (urinary tract infection) 01/29/2017  . Quadriceps muscle rupture, left, initial encounter   . Fall 01/09/2017  . Gait disturbance 01/09/2017  . Staphylococcus aureus infection 01/09/2017  . Infection of prosthetic left knee joint ([place]) 01/09/2017  . Fever, unknown origin 11/09/2016  . Critical lower limb ischemia 08/25/2016  . Ulcer of left midfoot with fat layer exposed ([place]) 08/13/2016  . Diabetic ulcer of left midfoot associated with type 2 diabetes mellitus, with fat layer exposed ([place]) 07/25/2016  . Peripheral neuropathy 07/22/2016  . Tobacco abuse 07/22/2016  . CAD in native artery 05/18/2016  . CAD, multiple vessel 05/11/2016  . Positive cardiac stress test 05/11/2016  . Abnormal stress test 04/30/2016  . Pre-transplant evaluation for kidney transplant 04/30/2016  . S/P revision of total knee 11/26/2015  . Pain in the chest   . Acute on chronic diastolic heart failure ([place]) 07/05/2015  . Volume overload 07/04/2015  . Shortness of breath 07/04/2015  . Hypoxemia 07/04/2015  . Elevated troponin   . End-stage renal disease on hemodialysis ([place])   . Hypervolemia   . 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 Pyogenic bacterial arthritis of knee, left ([place]) 08/07/2014  . Tachycardia  07/24/2014  . Acute upper respiratory infection 07/24/2014  . ESRD on dialysis ([place]) 07/14/2014  . Type II diabetes mellitus ([place]) 07/14/2014  . Anemia in chronic kidney disease 07/14/2014  . Congestive heart failure (CHF) ([place]) 07/13/2014  . Surgical wound dehiscence 05/09/2014  . Dehiscence of closure of skin 05/09/2014  . Total knee replacement status 04/10/2014  . Diabetes mellitus with renal manifestations, controlled ([place]) 10/24/2013  . Hypertensive renal disease 06/27/2013  . DM type 2 causing vascular disease ([place]) 06/27/2013  . Erectile dysfunction 06/27/2013  . Depression 06/27/2013  . Claudication of left lower extremity ([place]) 12/19/2012  . Essential hypertension, benign 12/19/2012  . Sinusitis, acute maxillary 11/22/2012  . Otitis, externa, infective 11/14/2012  . Leg edema, left 11/14/2012  . End stage renal disease ([place]) 10/02/2012  . Controlled type 2 DM with proteinuria or microalbuminuria 09/19/2012  . GERD (gastroesophageal reflux disease) 09/19/2012  . Leukocytosis 09/19/2012  . Lacunar infarction ([place]) 08/17/2012  . Polymyalgia rheumatica ([place]) 08/17/2012  . 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 Essential hypertension 05/10/2012  . Vitamin D deficiency 05/10/2012  . Diabetes mellitus due to underlying condition ([place]) 05/10/2012  . Hyperlipidemia LDL goal <100 05/10/2012  . Anemia of chronic disease 05/10/2012  . Screening for prostate cancer 05/10/2012  . Chronic kidney disease (CKD), stage IV (severe) ([place]) 05/10/2012  . Peripheral autonomic neuropathy due to DM ([place]) 05/10/2012  . Callus of foot 05/10/2012  . Urgency of  urination 05/10/2012  . Hyperkalemia 05/10/2012  . Candidiasis of the esophagus 10/12/2011  . Internal hemorrhoids without mention of complication 64/29/0379  . Pre-syncope 07/25/2009  . DJD (degenerative joint disease) of cervical spine 02/17/2009   PCP:  [facility] [place]:   [id number] [place], [place] -  [address] [place] [address] Phone: [phone] Fax: [phone]     Social Determinants of Health (SDOH) Interventions    Readmission Risk Interventions No flowsheet data found.

## 2019-01-05 DIAGNOSIS — S88111A Complete traumatic amputation at level between knee and ankle, right lower leg, initial encounter: Secondary | ICD-10-CM

## 2019-01-05 MED ORDER — PRO-STAT SUGAR FREE PO LIQD: 30.0000 mL | Freq: Two times a day (BID) | ORAL | 0 refills | Status: AC

## 2019-01-05 MED ORDER — METHOCARBAMOL 500 MG PO TABS: 500.0000 mg | ORAL_TABLET | Freq: Four times a day (QID) | ORAL | 0 refills | Status: DC | PRN

## 2019-01-05 MED ORDER — MENTHOL 3 MG MT LOZG: 1.0000 | LOZENGE | OROMUCOSAL | 12 refills | Status: AC | PRN

## 2019-01-05 MED ORDER — DOCUSATE SODIUM 100 MG PO CAPS: 100.0000 mg | ORAL_CAPSULE | Freq: Two times a day (BID) | ORAL | 0 refills | Status: AC

## 2019-01-05 MED ORDER — MENTHOL 3 MG MT LOZG
1.0000 | LOZENGE | OROMUCOSAL | Status: DC | PRN
  Administered 2019-01-05: 3 mg via ORAL
  Filled 2019-01-05: qty 9

## 2019-01-05 MED ORDER — OXYCODONE HCL 5 MG PO TABS: 5.0000 mg | ORAL_TABLET | Freq: Four times a day (QID) | ORAL | 0 refills | Status: DC | PRN

## 2019-01-05 MED ORDER — BISACODYL 10 MG RE SUPP: 10.0000 mg | Freq: Every day | RECTAL | 0 refills | Status: AC | PRN

## 2019-01-05 MED ORDER — POLYETHYLENE GLYCOL 3350 17 G PO PACK: 17.0000 g | PACK | Freq: Every day | ORAL | 0 refills | Status: DC | PRN

## 2019-01-05 NOTE — Care Management (Addendum)
Pt request slider board to help with transfers in the home.  Currently there are no recommendations for a slider board documented by [facility] therapy   CM received verbal order from attending. CM provided DME choice to pt - pt chose [employer].    Pt and attending aware that board can not be ordered until tomorrow as [employer] is closed for the holidays - pt still considered stable for discharge by attending and pt informed CM that he feels safe to discharge without board.   CM will e-mail [employer] requesting delivery of equipment on next working business day.  CM will request [employer] to follow up with pt directly regarding delivery of equipment.  CM also provided information on AVS of how pt can also purchase equipment if [employer] is unable to process order.

## 2019-01-05 NOTE — Discharge Summary (Signed)
Physician Discharge Summary  Matthew Kline FOY:[id number] DOB: [birth year] DOA: 12/29/2018  PCP: [facility]  Admit date: 12/29/2018 Discharge date: 01/05/2019  Admitted From: home  Disposition:  home   Recommendations for Outpatient Follow-up:  1. F/u with allergist for severe itching  Home Health:  ordered  Discharge Condition:  stable   CODE STATUS:  Full code   Diet recommendation:  Heart healthy, diabetic Consultants:   Ortho  Nephrology  Procedures:   Right transtibial amputation     Discharge Diagnoses:  Principal Problem:   Cellulitis and - Abscess of tendon sheath, right ankle and foot Active Problems:   Essential hypertension   Type II diabetes mellitus ([place])   End-stage renal disease on hemodialysis ([place])   Diabetic polyneuropathy associated with type 2 diabetes mellitus ([place])   Moderate protein-calorie malnutrition ([place])     Brief Summary: Matthew Kline 58 year old male, lives with family, reportedly ambulates with the help of left lower extremity prosthesis, PMH of ESRD due to diabetic nephropathy on MWF HD, DM 2 with peripheral neuropathy, HTN, HLD, CVA/lacunar stroke, CAD, s/p CABG, chronic diastolic CHF, PAD s/p left AKA and right great toe amputation, chronic foot ulcers, PMR with chronic pain, GERD, tobacco abuse presented to ED due to worsening of right chronic foot ulcer with associated drainage, redness and pain of 3 days duration. He missed 2 days of dialysis due to the pain and difficulty to weight-bear. In the ED, potassium 7, creatinine 9.94. Admitted for RLE infected wound with cellulitis, possible abscess and osteomyelitis, hyperkalemia after missed dialysis. Nephrology consulted and emergently underwent HD on day of admission with resolution of hyperkalemia. Orthopedics/Dr. Sharol Given consulted. 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 Hopefully appropriate for CIR.  Hospital Course:  Principal  Problem:   Cellulitis and abscess of toe of right foot- PAD- h/o left AKA - s/p transtibial amputation on 12/20 - his insurance has declined CIR - he does not want to go to SNF and thus he has opted to go home     Active Problems: Confusion - noted to be confused on 12/22 - per RN, this was worse after IV Dilaudid- he was also receiving 10-15 mg of Oxycodone repeatedly since  - due to ESRD, his narcotic clearance is poor- I have reduced doses of narcotics to decrease his level of confusion and have ordered IV Toradol instead - Confusion resolved by the following   Diffuse itching -   Hydroxyzine is helping control it some but it has not resolved -  he will need to follow up with an allergist after discharge    Essential hypertension - cont Amlodipine, Carvedilol     End-stage renal disease on hemodialysis - SHPT - cont HD on MWF and cont Renvela    Diabetic polyneuropathy associated with type 2 diabetes mellitus  - cont Gabapentin   Moderate Malnutrition  - cont Nepro shake, Prostat     Discharge Exam: Vitals:   01/05/19 0628 01/05/19 0921  BP: 134/80 131/82  Pulse: 82 72  Resp: 18 20  Temp: 98.5 F (36.9 C) 97.8 F (36.6 C)  SpO2: 98% 96%   Vitals:   01/04/19 1708 01/04/19 2247 01/05/19 0628 01/05/19 0921  BP: (!) 155/83 138/89 134/80 131/82  Pulse: 69 71 82 72  Resp: 16 16 18 20   Temp:  98.2 F (36.8 C) 98.5 F (36.9 C) 97.8 F (36.6 C)  TempSrc:  Oral Oral   SpO2: 100% 97% 98% 96%  Weight:  Height:        General: Pt is alert, awake, not in acute distress Cardiovascular: RRR, S1/S2 +, no rubs, no gallops Respiratory: CTA bilaterally, no wheezing, no rhonchi Abdominal: Soft, NT, ND, bowel sounds + Extremities: no edema, no cyanosis   Discharge Instructions  Discharge Instructions    Increase activity slowly   Complete by: As directed    Neg Press Wound Therapy / Incisional   Complete by: As directed      Allergies as of 01/05/2019       Reactions   Morphine And Related Other (See Comments)   hallucinations   Tygacil [tigecycline] Nausea And Vomiting, Other (See Comments)   Imodium [loperamide] Itching      Medication List    TAKE these medications   acetaminophen 500 MG tablet Commonly known as: TYLENOL Take 500-1,000 mg by mouth every 6 (six) hours as needed for mild pain or fever.   amLODipine 5 MG tablet Commonly known as: NORVASC Take 5 mg by mouth every evening.   aspirin 81 MG EC tablet Take 2 tablets (162 mg total) by mouth daily. What changed:   how much to take  when to take this   bisacodyl 10 MG suppository Commonly known as: DULCOLAX Place 1 suppository (10 mg total) rectally daily as needed for moderate constipation.   carvedilol 12.5 MG tablet Commonly known as: COREG Take 1 tablet (12.5 mg total) by mouth 2 (two) times daily with a meal.   Darbepoetin Alfa 150 MCG/0.3ML Sosy injection Commonly known as: ARANESP Inject 0.3 mLs (150 mcg total) into the vein every Friday with hemodialysis.   docusate sodium 100 MG capsule Commonly known as: COLACE Take 1 capsule (100 mg total) by mouth 2 (two) times daily.   feeding supplement (PRO-STAT SUGAR FREE 64) Liqd Take 30 mLs by mouth 2 (two) times daily.   gabapentin 100 MG capsule Commonly known as: NEURONTIN Take 200 mg by mouth daily.   heparin 1000 UNIT/ML injection Inject 4 mLs into the vein See admin instructions. Every hemodialysis treatment   hydrOXYzine 50 MG tablet Commonly known as: ATARAX/VISTARIL Take 50 mg by mouth 3 (three) times daily as needed for itching.   menthol-cetylpyridinium 3 MG lozenge Commonly known as: CEPACOL Take 1 lozenge (3 mg total) by mouth as needed for sore throat.   methocarbamol 500 MG tablet Commonly known as: ROBAXIN Take 1 tablet (500 mg total) by mouth every 6 (six) hours as needed for muscle spasms.   multivitamin Tabs tablet Take 1 tablet by mouth at bedtime.   oxyCODONE 5 MG  immediate release tablet Commonly known as: Oxy IR/ROXICODONE Take 1 tablet (5 mg total) by mouth every 6 (six) hours as needed for moderate pain, severe pain or breakthrough pain.   polyethylene glycol 17 g packet Commonly known as: MIRALAX / GLYCOLAX Take 17 g by mouth daily as needed for mild constipation.   sevelamer carbonate 800 MG tablet Commonly known as: RENVELA Take 1,600-3,200 mg by mouth 3 (three) times daily with meals. 2-4 tabs with each meal   Singulair 10 MG tablet Generic drug: montelukast Take 10 mg by mouth at bedtime as needed.   temazepam 30 MG capsule Commonly known as: RESTORIL Take 30 mg by mouth at bedtime.      Follow-up Information    [name], MD In 1 week.   Specialty: Orthopedic Surgery Contact information: [address] [place] [address] [phone]        [facility]  Call.   Why: To see if they are able to assist with a smaller wheelchair.  Contact information: [place] [address] [phone]        [facility] Follow up.   Specialty: Allergy and Asthma  Contact information: [place] [phone]       [facility]   Specialty: Allergy and Immunology Contact information: [place] [address] [phone]        [facility] Follow up.   Specialty: Home Health Services Why: [facility] arranged and will contact you to schedule visit 72 hours post discahrge d/t holiday.  Contact information: [address] [place] [address] [place] [address] [id number]          Allergies  Allergen Reactions  . Morphine And Related Other (See Comments)    hallucinations  . Tygacil [Tigecycline] Nausea And Vomiting and Other (See Comments)  . Imodium [Loperamide] Itching     Procedures/Studies:    MR ANKLE RIGHT WO CONTRAST  Result Date:  12/30/2018 CLINICAL DATA:  Anterior ankle ulceration, diabetic EXAM: MRI OF THE RIGHT ANKLE WITHOUT CONTRAST TECHNIQUE: Multiplanar, multisequence MR imaging of the ankle was performed. No intravenous contrast was administered. COMPARISON:  X-ray 10/31/2018 FINDINGS: Soft tissue defect at the anterior aspect of the midfoot at the level of the talonavicular joint overlying the tibialis anterior tendon. No underlying fluid collection or abscess. TENDONS Peroneal: Intact peroneus longus and peroneus brevis tendons. Posteromedial: Intact tibialis posterior, flexor hallucis longus and flexor digitorum longus tendons. Trace fluid within the flexor hallucis longus tendon sheath. Anterior: Tibialis anterior tendon is enlarged with a small amount of fluid signal within the tendon sheath. Tiny focus of susceptibility artifact within the tendon sheath just proximal to the level of the skin ulceration (series 9, image 11) suggesting a focus of gas. Tibialis anterior, extensor hallucis longus, and extensor digitorum longus tendons appear otherwise intact without tear. Achilles: Mild intermediate signal within the medial aspect of the distal Achilles tendon suggesting tendinosis without tear. Plantar Fascia: Intact. LIGAMENTS Lateral: Thickened, heterogeneous appearance of the posterior talofibular ligament which may reflect sequela of prior injury. Anterior talofibular ligament is intact and unremarkable. Remaining lateral ankle ligaments intact. Medial: Grossly intact. CARTILAGE Ankle Joint: No joint effusion or chondral defect. Subtalar Joints/Sinus Tarsi: No joint effusion or chondral defect. Bones: Thin subcortical marrow edema at the dorsal navicular (series 7, image 24) without corresponding low T1 signal. There is no evidence of overlying cortical destruction. Findings are likely reactive. Remaining bone marrow is otherwise within normal limits. No fracture. No dislocation. Other: Mild intramuscular edema like signal  within the intrinsic foot musculature may reflect denervation changes versus myositis. IMPRESSION: 1. Soft tissue ulceration with associated cellulitis dorsal to the talonavicular joint and overlying the tibialis anterior tendon. No underlying fluid collection or abscess. 2. Thin subcortical marrow edema at the dorsal navicular without corresponding low T1 signal or overlying cortical destruction to suggest osteomyelitis. Findings are likely reactive. 3. Ulceration overlies the tibialis anterior tendon which is diffusely thickened with mild tenosynovitis. Tiny focus of susceptibility within the tendon sheath likely reflecting a small focus of air related to overlying skin ulcer. 4. Mild intramuscular edema-like signal within the intrinsic foot musculature may reflect denervation changes versus myositis. 5. Mild distal Achilles tendinosis without tear. Electronically Signed   By: Davina Poke M.D.   On: 12/30/2018  13:00   DG Chest Portable 1 View  Result Date: 12/29/2018 CLINICAL DATA:  Crackles, missed dialysis. EXAM: PORTABLE CHEST 1 VIEW COMPARISON:  11/24/2018 FINDINGS: Cardiomediastinal contours with cardiac enlargement accentuated by low volume expansion and portable technique. Increased interstitial markings present bilaterally. Signs of linear opacities at the left lung base. No acute bone finding. IMPRESSION: Signs of mild interstitial edema and basilar atelectasis. Electronically Signed   By: Zetta Bills M.D.   On: 12/29/2018 18:36   VAS [place] LOWER EXTREMITY VENOUS (DVT) (ONLY [facility] & [facility] 7a-7p)  Result Date: 12/30/2018  Lower Venous Study Indications: Pain.  Limitations: Poor ultrasound/tissue interface and restricted mobility. Performing Technologist: Maudry Mayhew MHA, RDMS, RVT, RDCS  Examination Guidelines: A complete evaluation includes B-mode imaging, spectral Doppler, color Doppler, and power Doppler as needed of all accessible portions of each vessel. Bilateral testing is considered  an integral part of a complete examination. Limited examinations for reoccurring indications may be performed as noted.  +---------+---------------+---------+-----------+----------+--------------+ RIGHT    CompressibilityPhasicitySpontaneityPropertiesThrombus Aging +---------+---------------+---------+-----------+----------+--------------+ FV Prox  Full                                                        +---------+---------------+---------+-----------+----------+--------------+ FV Mid   Full                                                        +---------+---------------+---------+-----------+----------+--------------+ FV DistalFull                                                        +---------+---------------+---------+-----------+----------+--------------+ POP      Full           No       Yes                                 +---------+---------------+---------+-----------+----------+--------------+ PTV      Full                                                        +---------+---------------+---------+-----------+----------+--------------+ PERO     Full                                                        +---------+---------------+---------+-----------+----------+--------------+ Unable to visualize right common femoral vein and saphenofemoral junction.   Summary: Right: There is no evidence of deep vein thrombosis in the lower extremity. However, portions of this examination were limited- see technologist comments above. No cystic structure found in the popliteal fossa. Lower extremity venous flow is pulsatile, suggestive of possible elevated right heart pressure.  *See table(s) above  for measurements and observations. Electronically signed by Harold Barban MD on 12/30/2018 at 4:43:12 PM.    Final       The results of significant diagnostics from this hospitalization (including imaging, microbiology, ancillary and laboratory) are listed below for  reference.     Microbiology: Recent Results (from the past 240 hour(s))  Respiratory Panel by RT PCR (Flu A&B, Covid) - Nasopharyngeal Swab     Status: None   Collection Time: 12/29/18  4:50 PM   Specimen: Nasopharyngeal Swab  Result Value Ref Range Status   SARS Coronavirus 2 by RT PCR NEGATIVE NEGATIVE Final    Comment: (NOTE) SARS-CoV-2 target nucleic acids are NOT DETECTED. The SARS-CoV-2 RNA is generally detectable in upper respiratoy specimens during the acute phase of infection. The lowest concentration of SARS-CoV-2 viral copies this assay can detect is 131 copies/mL. A negative result does not preclude SARS-Cov-2 infection and should not be used as the sole basis for treatment or other patient management decisions. A negative result may occur with  improper specimen collection/handling, submission of specimen other than nasopharyngeal swab, presence of viral mutation(s) within the areas targeted by this assay, and inadequate number of viral copies (<131 copies/mL). A negative result must be combined with clinical observations, patient history, and epidemiological information. The expected result is Negative. Fact Sheet for Patients:  PinkCheek.be Fact Sheet for Healthcare Providers:  GravelBags.it This test is not yet ap proved or cleared by the [place] FDA and  has been authorized for detection and/or diagnosis of SARS-CoV-2 by FDA under an Emergency Use Authorization (EUA). This EUA will remain  in effect (meaning this test can be used) for the duration of the COVID-19 declaration under Section 564(b)(1) of the Act, 21 U.S.C. section 360bbb-3(b)(1), unless the authorization is terminated or revoked sooner.    Influenza A by PCR NEGATIVE NEGATIVE Final   Influenza B by PCR NEGATIVE NEGATIVE Final    Comment: (NOTE) The Xpert Xpress SARS-CoV-2/FLU/RSV assay is intended as an aid in  the diagnosis of  influenza from Nasopharyngeal swab specimens and  should not be used as a sole basis for treatment. Nasal washings and  aspirates are unacceptable for Xpert Xpress SARS-CoV-2/FLU/RSV  testing. Fact Sheet for Patients: PinkCheek.be Fact Sheet for Healthcare Providers: GravelBags.it This test is not yet approved or cleared by the [place] FDA and  has been authorized for detection and/or diagnosis of SARS-CoV-2 by  FDA under an Emergency Use Authorization (EUA). This EUA will remain  in effect (meaning this test can be used) for the duration of the  Covid-19 declaration under Section 564(b)(1) of the Act, 21  U.S.C. section 360bbb-3(b)(1), unless the authorization is  terminated or revoked. Performed at [facility], [place] [address] [place], [place] [address]   MRSA PCR Screening     Status: None   Collection Time: 12/30/18  1:53 AM   Specimen: Nasal Mucosa; Nasopharyngeal  Result Value Ref Range Status   MRSA by PCR NEGATIVE NEGATIVE Final    Comment:        The GeneXpert MRSA Assay (FDA approved for NASAL specimens only), is one component of a comprehensive MRSA colonization surveillance program. It is not intended to diagnose MRSA infection nor to guide or monitor treatment for MRSA infections. Performed at [facility], [place] [address] [place], [place] [address]   Surgical PCR screen     Status: None   Collection Time: 12/31/18 10:22 AM   Specimen: Nasal Mucosa;  Nasal Swab  Result Value Ref Range Status   MRSA, PCR NEGATIVE NEGATIVE Final   Staphylococcus aureus NEGATIVE NEGATIVE Final    Comment: (NOTE) The Xpert SA Assay (FDA approved for NASAL specimens in patients 98 years of age and older), is one component of a comprehensive surveillance program. It is not intended to diagnose infection nor to guide or monitor treatment. Performed at [facility], [place] [address], [place],  [place] [address]      Labs: BNP (last 3 results) No results for input(s): BNP in the last 8760 hours. Basic Metabolic Panel: Recent Labs  Lab 12/29/18 1244 12/30/18 0223 12/31/18 1047 01/01/19 1316  NA 138 136 137 136  K 7.0* 4.1 4.4 5.3*  CL 95* 95* 98 96*  CO2 24 27  --  24  GLUCOSE 125* 153* 94 129*  BUN 53* 14 32* 42*  CREATININE 9.94* 4.50* 6.50* 8.54*  CALCIUM 9.6 9.3  --  9.1  PHOS  --   --   --  8.1*   Liver Function Tests: Recent Labs  Lab 12/29/18 1244 01/01/19 1316  AST 84*  --   ALT 51*  --   ALKPHOS 143*  --   BILITOT 1.1  --   PROT 8.0  --   ALBUMIN 3.0* 2.8*   No results for input(s): LIPASE, AMYLASE in the last 168 hours. No results for input(s): AMMONIA in the last 168 hours. CBC: Recent Labs  Lab 12/29/18 1244 12/31/18 1047 01/01/19 1316 01/02/19 0458  WBC 8.9  --  8.5 9.0  NEUTROABS 7.0  --   --   --   HGB 10.3* 11.2* 9.5* 10.1*  HCT 32.1* 33.0* 29.8* 31.9*  MCV 93.9  --  92.8 94.4  PLT 191  --  196 187   Cardiac Enzymes: No results for input(s): CKTOTAL, CKMB, CKMBINDEX, TROPONINI in the last 168 hours. BNP: Invalid input(s): POCBNP CBG: Recent Labs  Lab 01/01/19 2031 01/02/19 0658 01/02/19 1147  GLUCAP 126* 87 89   D-Dimer No results for input(s): DDIMER in the last 72 hours. Hgb A1c No results for input(s): HGBA1C in the last 72 hours. Lipid Profile No results for input(s): CHOL, HDL, LDLCALC, TRIG, CHOLHDL, LDLDIRECT in the last 72 hours. Thyroid function studies No results for input(s): TSH, T4TOTAL, T3FREE, THYROIDAB in the last 72 hours.  Invalid input(s): FREET3 Anemia work up No results for input(s): VITAMINB12, FOLATE, FERRITIN, TIBC, IRON, RETICCTPCT in the last 72 hours. Urinalysis    Component Value Date/Time   COLORURINE YELLOW 01/29/2017 1141   APPEARANCEUR CLOUDY (A) 01/29/2017 1141   APPEARANCEUR Clear 07/12/2013 0918   LABSPEC 1.011 01/29/2017 1141   PHURINE 8.0 01/29/2017 1141   GLUCOSEU 50 (A)  01/29/2017 1141   HGBUR SMALL (A) 01/29/2017 1141   BILIRUBINUR NEGATIVE 01/29/2017 1141   BILIRUBINUR Negative 07/12/2013 0918   KETONESUR NEGATIVE 01/29/2017 1141   PROTEINUR 100 (A) 01/29/2017 1141   UROBILINOGEN 0.2 01/03/2012 1335   NITRITE NEGATIVE 01/29/2017 1141   LEUKOCYTESUR LARGE (A) 01/29/2017 1141   LEUKOCYTESUR Negative 07/12/2013 0918   Sepsis Labs Invalid input(s): PROCALCITONIN,  WBC,  LACTICIDVEN Microbiology Recent Results (from the past 240 hour(s))  Respiratory Panel by RT PCR (Flu A&B, Covid) - Nasopharyngeal Swab     Status: None   Collection Time: 12/29/18  4:50 PM   Specimen: Nasopharyngeal Swab  Result Value Ref Range Status   SARS Coronavirus 2 by RT PCR NEGATIVE NEGATIVE Final    Comment: (  NOTE) SARS-CoV-2 target nucleic acids are NOT DETECTED. The SARS-CoV-2 RNA is generally detectable in upper respiratoy specimens during the acute phase of infection. The lowest concentration of SARS-CoV-2 viral copies this assay can detect is 131 copies/mL. A negative result does not preclude SARS-Cov-2 infection and should not be used as the sole basis for treatment or other patient management decisions. A negative result may occur with  improper specimen collection/handling, submission of specimen other than nasopharyngeal swab, presence of viral mutation(s) within the areas targeted by this assay, and inadequate number of viral copies (<131 copies/mL). A negative result must be combined with clinical observations, patient history, and epidemiological information. The expected result is Negative. Fact Sheet for Patients:  PinkCheek.be Fact Sheet for Healthcare Providers:  GravelBags.it This test is not yet ap proved or cleared by the [place] FDA and  has been authorized for detection and/or diagnosis of SARS-CoV-2 by FDA under an Emergency Use Authorization (EUA). This EUA will remain  in effect  (meaning this test can be used) for the duration of the COVID-19 declaration under Section 564(b)(1) of the Act, 21 U.S.C. section 360bbb-3(b)(1), unless the authorization is terminated or revoked sooner.    Influenza A by PCR NEGATIVE NEGATIVE Final   Influenza B by PCR NEGATIVE NEGATIVE Final    Comment: (NOTE) The Xpert Xpress SARS-CoV-2/FLU/RSV assay is intended as an aid in  the diagnosis of influenza from Nasopharyngeal swab specimens and  should not be used as a sole basis for treatment. Nasal washings and  aspirates are unacceptable for Xpert Xpress SARS-CoV-2/FLU/RSV  testing. Fact Sheet for Patients: PinkCheek.be Fact Sheet for Healthcare Providers: GravelBags.it This test is not yet approved or cleared by the [place] FDA and  has been authorized for detection and/or diagnosis of SARS-CoV-2 by  FDA under an Emergency Use Authorization (EUA). This EUA will remain  in effect (meaning this test can be used) for the duration of the  Covid-19 declaration under Section 564(b)(1) of the Act, 21  U.S.C. section 360bbb-3(b)(1), unless the authorization is  terminated or revoked. Performed at [facility], [place] [address] [place], [place] [address]   MRSA PCR Screening     Status: None   Collection Time: 12/30/18  1:53 AM   Specimen: Nasal Mucosa; Nasopharyngeal  Result Value Ref Range Status   MRSA by PCR NEGATIVE NEGATIVE Final    Comment:        The GeneXpert MRSA Assay (FDA approved for NASAL specimens only), is one component of a comprehensive MRSA colonization surveillance program. It is not intended to diagnose MRSA infection nor to guide or monitor treatment for MRSA infections. Performed at [facility], [place] [address] [place], [place] [address]   Surgical PCR screen     Status: None   Collection Time: 12/31/18 10:22 AM   Specimen: Nasal Mucosa; Nasal Swab  Result Value Ref Range Status    MRSA, PCR NEGATIVE NEGATIVE Final   Staphylococcus aureus NEGATIVE NEGATIVE Final    Comment: (NOTE) The Xpert SA Assay (FDA approved for NASAL specimens in patients 28 years of age and older), is one component of a comprehensive surveillance program. It is not intended to diagnose infection nor to guide or monitor treatment. Performed at [facility], [place] [address], [place], [place] [address]      Time coordinating discharge in minutes: 67  SIGNED:   Debbe Odea, MD  [facility] 01/05/2019, 11:27 AM Pager   If 7PM-7AM, please  contact night-coverage [email or web address] Password [email or web address]

## 2019-01-05 NOTE — TOC Progression Note (Addendum)
Transition of Care [facility]) - Progression Note    Patient Details  Name: Orville Mena MRN: [id number] Date of Birth: [birth year]  Transition of Care [facility]) CM/SW [place], LCSW Phone Number: 01/05/2019, 10:05 AM  Clinical Narrative:    CSW sent referral to [facility] for PT/OT/RN. Will need home health orders and Face to Face.   [facility] unable to accept patient. CSW sent referral to [facility] for review and they are able to accept patient.    Expected Discharge Plan: [place] Barriers to Discharge: No Barriers Identified  Expected Discharge Plan and Services Expected Discharge Plan: [place] In-house Referral: Clinical Social Work Discharge Planning Services: CM Consult Post Acute Care Choice: [place] arrangements for the past 2 months: Single Family Home Expected Discharge Date: 01/05/19               DME Arranged: N/A         HH Arranged: PT, OT, RN [place] Agency: [facility] (formerly [employer]) Date [place]: 01/05/19 Time [place]: 1005 Representative spoke with at [place]: [place] ([place]) Interventions    Readmission Risk Interventions No flowsheet data found.

## 2019-01-05 NOTE — Progress Notes (Signed)
[name] to be discharged Home` per MD order. Discussed prescriptions and follow up appointments with the patient. Prescriptions given to patient; medication list explained in detail. Patient verbalized understanding.  Skin clean, dry and intact without evidence of skin break down, no evidence of skin tears noted. IV catheter discontinued intact. Site without signs and symptoms of complications. Dressing and pressure applied. Pt denies pain at the site currently. No complaints noted.  Patient free of lines, drains.  Wound vac intact  An After Visit Summary (AVS) was printed and given to the patient. Patient escorted via wheelchair, and discharged home via private auto.  Shela Commons, RN

## 2019-01-05 NOTE — Progress Notes (Signed)
[name], male   DOB: 16-Jan-1960, 58 y.o.   MRN: [id number] [facility] Progress Note   Assessment/ Plan:   1.  Cellulitis with ulceration and abscess of right ankle: With prior history of peripheral vascular disease and now status post right below-knee amputation with wound VAC in place.  Additional management per orthopedic surgery (with plans to discontinue wound VAC on 12/27). 2. ESRD: Usually on a Monday/Wednesday/Friday dialysis schedule with last hemodialysis done on Wednesday.  He is on schedule for hemodialysis again tomorrow if still here (otherwise he will call his dialysis unit early tomorrow morning to be informed of his chair time). 3. Anemia: Hemoglobin/hematocrit acceptable when last checked 4 days ago, continue to follow trend as an outpatient on ESA. 4. CKD-MBD: Significant hyperphosphatemia on labs from 4 days ago, continue binders and VDRF with low calcium bath. 5. Nutrition: Continue renal diet with ongoing renal MVI/protein supplementation to promote wound healing. 6. Hypertension: Blood pressure currently within acceptable range, will continue to follow on HD.  Subjective:   Reports to be feeling fair, denies any complaints.   Objective:   BP 131/82 (BP Location: Right Arm)   Pulse 72   Temp 97.8 F (36.6 C)   Resp 20   Ht 5' 11.5" (1.816 m)   Wt 62.9 kg   SpO2 96%   BMI 19.07 kg/m   Physical Exam: Gen: Comfortably sitting up in bed, speaking on cell phone CVS: Pulse regular rhythm, normal rate, S1 and S2 normal Resp: Clear to auscultation, no rales/rhonchi Abd: Soft, flat, nontender Ext: Right BKA stump with wound VAC, status post left AKA without edema.  Left upper arm AVF with palpable thrill.  Labs: BMET Recent Labs  Lab 12/29/18 1244 12/30/18 0223 12/31/18 1047 01/01/19 1316  NA 138 136 137 136  K 7.0* 4.1 4.4 5.3*  CL 95* 95* 98 96*  CO2 24 27  --  24  GLUCOSE 125* 153* 94 129*  BUN 53* 14 32* 42*   CREATININE 9.94* 4.50* 6.50* 8.54*  CALCIUM 9.6 9.3  --  9.1  PHOS  --   --   --  8.1*   CBC Recent Labs  Lab 12/29/18 1244 12/31/18 1047 01/01/19 1316 01/02/19 0458  WBC 8.9  --  8.5 9.0  NEUTROABS 7.0  --   --   --   HGB 10.3* 11.2* 9.5* 10.1*  HCT 32.1* 33.0* 29.8* 31.9*  MCV 93.9  --  92.8 94.4  PLT 191  --  196 187      Medications:    . amLODipine  5 mg Oral QPM  . aspirin EC  81 mg Oral QHS  . carvedilol  12.5 mg Oral BID WC  . Chlorhexidine Gluconate Cloth  6 each Topical Q0600  . darbepoetin (ARANESP) injection - DIALYSIS  60 mcg Intravenous Q Wed-HD  . docusate sodium  100 mg Oral BID  . doxercalciferol  4 mcg Intravenous Q M,W,F-HD  . feeding supplement (NEPRO CARB STEADY)  237 mL Oral TID BM  . feeding supplement (PRO-STAT SUGAR FREE 64)  30 mL Oral BID  . gabapentin  200 mg Oral Daily  . heparin  5,000 Units Subcutaneous Q8H  . hydrOXYzine  75 mg Oral TID  . ketorolac  30 mg Intravenous Q6H  . montelukast  10 mg Oral QHS  . multivitamin  1 tablet Oral QHS  . sevelamer carbonate  3,200 mg Oral TID WC  . temazepam  30 mg Oral  QHS   Elmarie Shiley, MD 01/05/2019, 10:52 AM

## 2019-01-06 ENCOUNTER — Telehealth: Payer: Self-pay | Admitting: Nephrology

## 2019-01-06 NOTE — Telephone Encounter (Signed)
Transition of care contact from inpatient facility  Date of discharge: 01/05/19 Date of contact 01/06/19  Spoke to patient by phone   Patient contacted to discuss transition of care from recent inpatient hospitalization. Patient was admitted to [facility] 12/18 -01/05/19 with cellulitis and abscess of right foot s/p transtibial amputation 12/20.  Medication changes reviewed.  Patient will follow up with his outpatient dialysis center tomorrow 12/27. Did not go to dialysis today because he was told there were no available chairs today.  Other follow up needs include awaiting contact from home health aide.

## 2019-01-07 ENCOUNTER — Telehealth: Payer: Self-pay

## 2019-01-07 NOTE — Telephone Encounter (Signed)
LVM to notify patient that slider board has been ordered through [facility] and will be shipped to his home Monday.

## 2019-01-08 ENCOUNTER — Ambulatory Visit: Payer: Medicare HMO | Admitting: Physician Assistant

## 2019-01-08 ENCOUNTER — Telehealth: Payer: Self-pay | Admitting: Radiology

## 2019-01-08 NOTE — Telephone Encounter (Signed)
I called Jackquline Denmark and Barnetta Chapel sent me to Benjamine Mola, Therapist, sports for [employer]. I left a message for her to return call to see if they can go out and remove patient's wound vac since he could not make it into his appt today. Patient had surgery on discontinued at home. Awaiting on call back.

## 2019-01-08 NOTE — Telephone Encounter (Signed)
Patient called triage and states wound vac has not been working since yesterday. He feels that there is a connection that is loose. He has been offered appointment to come into the office, but states that he cannot find a ride. He requests we call [facility] to have them discontinue the wound vac.  Please call patient to advise on what to expect.

## 2019-01-09 ENCOUNTER — Telehealth (HOSPITAL_COMMUNITY): Payer: Self-pay

## 2019-01-09 ENCOUNTER — Ambulatory Visit (INDEPENDENT_AMBULATORY_CARE_PROVIDER_SITE_OTHER): Payer: Medicare HMO | Admitting: Family

## 2019-01-09 ENCOUNTER — Encounter: Payer: Self-pay | Admitting: Family

## 2019-01-09 ENCOUNTER — Telehealth: Payer: Self-pay

## 2019-01-09 ENCOUNTER — Other Ambulatory Visit: Payer: Self-pay

## 2019-01-09 VITALS — Ht 71.5 in | Wt 138.7 lb

## 2019-01-09 DIAGNOSIS — Z89511 Acquired absence of right leg below knee: Secondary | ICD-10-CM

## 2019-01-09 DIAGNOSIS — S88119A Complete traumatic amputation at level between knee and ankle, unspecified lower leg, initial encounter: Secondary | ICD-10-CM

## 2019-01-09 LAB — SURGICAL PATHOLOGY

## 2019-01-09 NOTE — Telephone Encounter (Signed)
FYI-  Patient called and stated that he can come in today to have wound vac removed.  R/S his appointment to today, 01/09/2019 with Erin Z.

## 2019-01-09 NOTE — Progress Notes (Signed)
Office Visit Note   Patient: Matthew Kline           Date of Birth: June 17, 1960           MRN: [id number] Visit Date: 01/09/2019              Requested by: [facility] [place] [address] [place],  [place] [id number] PCP: [place]  Chief Complaint  Patient presents with  . Right Leg - Routine Post Op    12/31/2018 right BKA      HPI: Patient presents today 10 days status post right below-knee amputation he is overall doing well but his wound VAC stopped functioning he is requesting home health for OT PT and nursing if needed  Assessment & Plan: Visit Diagnoses: No diagnosis found.  Plan: He will follow up in 1 week his shrinker.  Follow-Up Instructions: No follow-ups on file.   Ortho Exam  Patient is alert, oriented, no adenopathy, well-dressed, normal affect, normal respiratory effort. Incision is healing well. No necrosis . Skin is well appoximated No cellulitis  Imaging: No results found. No images are attached to the encounter.  Labs: Lab Results  Component Value Date   HGBA1C 5.7 (H) 10/03/2018   HGBA1C 5.7 (H) 08/01/2018   HGBA1C 5.3 07/10/2018   ESRSEDRATE 57 (H) 12/30/2018   ESRSEDRATE 80 (H) 11/29/2015   ESRSEDRATE 75 (H) 08/07/2014   CRP 7.6 (H) 12/30/2018   CRP 9.8 (H) 07/14/2016   CRP 20.4 (H) 11/29/2015   REPTSTATUS 11/29/2018 FINAL 11/24/2018   GRAMSTAIN  01/12/2017    RARE WBC PRESENT, PREDOMINANTLY MONONUCLEAR FEW GRAM POSITIVE COCCI IN PAIRS    CULT  11/24/2018    NO GROWTH 5 DAYS Performed at [facility], [place] [address], [place], [place] [address]    LABORGA STAPHYLOCOCCUS AUREUS 01/12/2017     Lab Results  Component Value Date   ALBUMIN 2.8 (L) 01/01/2019   ALBUMIN 3.0 (L) 12/29/2018   ALBUMIN 2.8 (L) 11/26/2018   PREALBUMIN 13.3 (L) 12/30/2018    Lab Results  Component Value Date   MG 2.1 08/02/2018   MG 2.2 08/01/2018   MG 1.9 01/10/2012   No results found for: [facility]  Lab Results    Component Value Date   PREALBUMIN 13.3 (L) 12/30/2018   CBC EXTENDED Latest Ref Rng & Units 01/02/2019 01/01/2019 12/31/2018  WBC 4.0 - 10.5 K/uL 9.0 8.5 -  RBC 4.22 - 5.81 MIL/uL 3.38(L) 3.21(L) -  HGB 13.0 - 17.0 g/dL 10.1(L) 9.5(L) 11.2(L)  HCT 39.0 - 52.0 % 31.9(L) 29.8(L) 33.0(L)  PLT 150 - 400 K/uL 187 196 -  NEUTROABS 1.7 - 7.7 K/uL - - -  LYMPHSABS 0.7 - 4.0 K/uL - - -     Body mass index is 19.07 kg/m.  Orders:  No orders of the defined types were placed in this encounter.  No orders of the defined types were placed in this encounter.    Procedures: No procedures performed  Clinical Data: No additional findings.  ROS:  All other systems negative, except as noted in the HPI. Review of Systems  Objective: Vital Signs: Ht 5' 11.5" (1.816 m)   Wt 138 lb 10.7 oz (62.9 kg)   BMI 19.07 kg/m   Specialty Comments:  No specialty comments available.  PMFS History: Patient Active Problem List   Diagnosis Date Noted  . Below-knee amputation of right lower extremity ([place])   . Abscess of tendon sheath, right ankle and foot   .  Diabetic polyneuropathy associated with type 2 diabetes mellitus ([place])   . Moderate protein-calorie malnutrition ([place])   . Cellulitis and abscess of toe of right foot 12/29/2018  . Cellulitis of scrotum 11/27/2018  . Sepsis ([place]) 11/24/2018  . Thrombocytopenia ([place]) 11/24/2018  . Bilateral inguinal hernia 11/21/2018  . Biliary colic 27/51/7001  . Acute cholecystitis 07/31/2018  . Prolonged QT interval 07/31/2018  . Acute respiratory failure with hypoxia ([place]) 07/10/2018  . Abdominal pain 07/10/2018  . Bilateral recurrent inguinal hernia without obstruction or gangrene   . Altered mental status   . Cerebral thrombosis with cerebral infarction 02/06/2017  . Cerebral embolism with cerebral infarction 02/06/2017  . Hx of AKA (above knee amputation), left ([place])   . Pressure injury of skin 01/30/2017  . Acute lower UTI 01/29/2017  .  Acute metabolic encephalopathy 74/94/4967  . UTI (urinary tract infection) 01/29/2017  . Quadriceps muscle rupture, left, initial encounter   . Fall 01/09/2017  . Gait disturbance 01/09/2017  . Staphylococcus aureus infection 01/09/2017  . Infection of prosthetic left knee joint ([place]) 01/09/2017  . Fever, unknown origin 11/09/2016  . Critical lower limb ischemia 08/25/2016  . Ulcer of left midfoot with fat layer exposed ([place]) 08/13/2016  . Diabetic ulcer of left midfoot associated with type 2 diabetes mellitus, with fat layer exposed ([place]) 07/25/2016  . Peripheral neuropathy 07/22/2016  . Tobacco abuse 07/22/2016  . CAD in native artery 05/18/2016  . CAD, multiple vessel 05/11/2016  . Positive cardiac stress test 05/11/2016  . Abnormal stress test 04/30/2016  . Pre-transplant evaluation for kidney transplant 04/30/2016  . S/P revision of total knee 11/26/2015  . Pain in the chest   . Acute on chronic diastolic heart failure ([place]) 07/05/2015  . Volume overload 07/04/2015  . Shortness of breath 07/04/2015  . Hypoxemia 07/04/2015  . Elevated troponin   . End-stage renal disease on hemodialysis ([place])   . Hypervolemia   . 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 Pyogenic bacterial arthritis of knee, left ([place]) 08/07/2014  . Tachycardia 07/24/2014  . Acute upper respiratory infection 07/24/2014  . ESRD on dialysis ([place]) 07/14/2014  . Type II diabetes mellitus ([place]) 07/14/2014  . Anemia in chronic kidney disease 07/14/2014  . Congestive heart failure (CHF) ([place]) 07/13/2014  . Surgical wound dehiscence 05/09/2014  . Dehiscence of closure of skin 05/09/2014  . Total knee replacement status 04/10/2014  . Diabetes mellitus with renal manifestations, controlled ([place]) 10/24/2013  . Hypertensive renal disease 06/27/2013  . DM type 2 causing vascular disease ([place]) 06/27/2013  . Erectile dysfunction 06/27/2013  . Depression 06/27/2013  . Claudication of left lower extremity  ([place]) 12/19/2012  . Essential hypertension, benign 12/19/2012  . Sinusitis, acute maxillary 11/22/2012  . Otitis, externa, infective 11/14/2012  . Leg edema, left 11/14/2012  . End stage renal disease ([place]) 10/02/2012  . Controlled type 2 DM with proteinuria or microalbuminuria 09/19/2012  . GERD (gastroesophageal reflux disease) 09/19/2012  . Leukocytosis 09/19/2012  . Lacunar infarction ([place]) 08/17/2012  . Polymyalgia rheumatica ([place]) 08/17/2012  . 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 Essential hypertension 05/10/2012  . Vitamin D deficiency 05/10/2012  . Diabetes mellitus due to underlying condition ([place]) 05/10/2012  . Hyperlipidemia LDL goal <100 05/10/2012  . Anemia of chronic disease 05/10/2012  . Screening for prostate cancer 05/10/2012  . Chronic kidney disease (CKD), stage IV (severe) ([place]) 05/10/2012  . Peripheral autonomic neuropathy due to DM ([place]) 05/10/2012  . Callus of foot 05/10/2012  .  Urgency of urination 05/10/2012  . Hyperkalemia 05/10/2012  . Candidiasis of the esophagus 10/12/2011  . Internal hemorrhoids without mention of complication 49/70/2637  . Pre-syncope 07/25/2009  . DJD (degenerative joint disease) of cervical spine 02/17/2009   Past Medical History:  Diagnosis Date  . Anemia, unspecified   . Anxiety   . Arthralgia 2010   polyarticular  . Arthritis    "back, knees" (01/10/2017)  . Cancer [facility])    "kidney area" (01/10/2017)  . CHF (congestive heart failure) ([place]) 07/25/2009   denies  . Chronic lower back pain   . Coronary artery disease   . Coughing    pt. reports that he has drainage from sinus infection  . Diabetic foot ulcer ([place])   . Diabetic neuropathy ([place])   . ESRD (end stage renal disease) on dialysis [facility])    started 12/2012; "MWF; [facility] "  (01/10/2017)  . GERD (gastroesophageal reflux disease)    hx "before I lost weight", no problem 9 years  . Hemodialysis access site with mature fistula ([place])   . Hemorrhoids,  internal 10/2011   small  . High cholesterol   . History of blood transfusion    "related to the anemia"  . Hypertension   . Insomnia, unspecified   . Lacunar infarction ([place]) 2006   RUE/RLE, speech  . Long term (current) use of anticoagulants   . Myocardial infarction ([place]) 1995  . Orthostatic hypotension   . Osteomyelitis of foot, left, acute ([place])   . Other chronic postoperative pain   . Pneumonia    "probably twice" (01/10/2017)  . Polymyalgia rheumatica ([place])   . Renal insufficiency   . Sleep apnea    "lost weight; no more problem" (01/10/2017)  . Stroke ([place]) 01/10/06   denies residual on 05/09/2014  . Type II diabetes mellitus ([place]) dx'd 1995  . Unspecified hereditary and idiopathic peripheral neuropathy    feet  . Unspecified osteomyelitis, site unspecified   . Unspecified vitamin D deficiency     Family History  Problem Relation Age of Onset  . Hypertension Mother   . Cancer Mother 66       Ovarian  . Heart disease Maternal Aunt   . Stroke Maternal Grandfather     Past Surgical History:  Procedure Laterality Date  . ABDOMINAL AORTOGRAM N/A 08/25/2016   Procedure: ABDOMINAL AORTOGRAM;  Surgeon: Wellington Hampshire, MD;  Location: [place] [facility];  Service: Cardiovascular;  Laterality: N/A;  . AMPUTATION  01/21/2012   Procedure: AMPUTATION RAY;  Surgeon: [name], MD;  Location: [place];  Service: Orthopedics;  Laterality: Left;  Left Foot 4th Ray Amputation  . AMPUTATION Left 05/04/2013   Procedure: AMPUTATION DIGIT;  Surgeon: [name], MD;  Location: [place];  Service: Orthopedics;  Laterality: Left;  Left Great Toe Amputation at MTP  . AMPUTATION Left 01/14/2017   Procedure: AMPUTATION ABOVE LEFT KNEE;  Surgeon: [name], MD;  Location: [place];  Service: Orthopedics;  Laterality: Left;  . AMPUTATION Right 12/31/2018   Procedure: AMPUTATION BELOW KNEE;  Surgeon: [name], MD;  Location: [place];  Service: Orthopedics;  Laterality: Right;  . ANTERIOR  CERVICAL DECOMP/DISCECTOMY FUSION  02/2011  . APPLICATION OF WOUND VAC Right 12/31/2018   Procedure: Application Of Wound Vac;  Surgeon: [name], MD;  Location: [place];  Service: Orthopedics;  Laterality: Right;  . BACK SURGERY    . BASCILIC VEIN TRANSPOSITION Left 10/19/2012   Procedure:  BASCILIC VEIN TRANSPOSITION;  Surgeon: Serafina Mitchell, MD;  Location: [place];  Service: Vascular;  Laterality: Left;  . CARDIAC CATHETERIZATION     "before bypass"  . CHOLECYSTECTOMY N/A 08/02/2018   Procedure: LAPAROSCOPIC CHOLECYSTECTOMY WITH INTRAOPERATIVE CHOLANGIOGRAM;  Surgeon: Donnie Mesa, MD;  Location: [place];  Service: General;  Laterality: N/A;  . CORONARY ARTERY BYPASS GRAFT     x 5 with lima at [place] SPACERS Left 08/07/2014   Procedure: Replace Left Total Knee Arthroplasty,  Place Antibiotic Spacer;  Surgeon: [name], MD;  Location: [place];  Service: Orthopedics;  Laterality: Left;  . I & D EXTREMITY Left 05/09/2014   Procedure: Irrigation and Debridement Left Knee and Closure of Total Knee Arthroplasty Incision;  Surgeon: [name], MD;  Location: [place];  Service: Orthopedics;  Laterality: Left;  . I & D KNEE WITH POLY EXCHANGE Left 05/31/2014   Procedure: IRRIGATION AND DEBRIDEMENT LEFT KNEE, PLACE ANTIBIOTIC BEADS,  POLY EXCHANGE;  Surgeon: [name], MD;  Location: [place];  Service: Orthopedics;  Laterality: Left;  . INGUINAL HERNIA REPAIR Bilateral 11/21/2018   Procedure: BILATERAL INGUINAL HERNIA REPAIR WITH MESH;  Surgeon: Coralie Keens, MD;  Location: [place];  Service: General;  Laterality: Bilateral;  . IRRIGATION AND DEBRIDEMENT KNEE Left 01/12/2017   Procedure: IRRIGATION AND DEBRIDEMENT LEFT KNEE;  Surgeon: [name], MD;  Location: [place];  Service: Orthopedics;  Laterality: Left;  . JOINT REPLACEMENT    . KNEE ARTHROSCOPY Left 08-25-2012  . LOWER EXTREMITY ANGIOGRAPHY Left 08/25/2016   Procedure: Lower  Extremity Angiography;  Surgeon: Wellington Hampshire, MD;  Location: [place] [facility];  Service: Cardiovascular;  Laterality: Left;  . PERIPHERAL VASCULAR BALLOON ANGIOPLASTY Left 08/25/2016   Procedure: PERIPHERAL VASCULAR BALLOON ANGIOPLASTY;  Surgeon: Wellington Hampshire, MD;  Location: [place] [facility];  Service: Cardiovascular;  Laterality: Left;  lt peroneal and ant tibial arteries cutting balloon  . REFRACTIVE SURGERY Bilateral   . TOE AMPUTATION Bilateral    "I've lost 7 toes over the last 7 years" (05/09/2014)  . TOE SURGERY Left April 2015   Big toe removed on left foot.  . TONSILLECTOMY    . TOTAL KNEE ARTHROPLASTY Left 04/10/2014   Procedure: TOTAL KNEE ARTHROPLASTY;  Surgeon: [name], MD;  Location: [place];  Service: Orthopedics;  Laterality: Left;  . TOTAL KNEE REVISION Left 10/25/2014   Procedure: LEFT TOTAL KNEE REVISION;  Surgeon: [name], MD;  Location: [place];  Service: Orthopedics;  Laterality: Left;  . TOTAL KNEE REVISION Left 11/26/2015   Procedure: Removal Left Total Knee Arthroplasty, Hinged Total Knee Arthroplasty;  Surgeon: [name], MD;  Location: [place];  Service: Orthopedics;  Laterality: Left;  . UVULOPALATOPHARYNGOPLASTY, TONSILLECTOMY AND SEPTOPLASTY  ~ 1989  . WOUND DEBRIDEMENT Left 05/09/2014   Dehiscence Left Total Knee Arthroplasty Incision   Social History   Occupational History  . Not on file  Tobacco Use  . Smoking status: Current Some Day Smoker    Packs/day: 0.12    Years: 32.00    Pack years: 3.84    Types: Cigarettes  . Smokeless tobacco: Never Used  . Tobacco comment: 1 per week  Substance and Sexual Activity  . Alcohol use: No    Alcohol/week: 0.0 standard drinks  . Drug use: No  . Sexual activity: Not Currently

## 2019-01-09 NOTE — Telephone Encounter (Signed)
Called [facility] home health. No answer. Message left with contact information for call back.   Cletis Media RN BSN

## 2019-01-09 NOTE — Telephone Encounter (Signed)
Able to speak with patient via telephone to check status post discharge home. Patient states he is doing well except he has had to reschedule multiple appointments due to unforeseen circumstances. He states his wound vac stopped working over the weekend and he had an appointment with Dr Jess Barters office for removal yesterday however did not have a ride there. Appointment for removal of vac rescheduled for today and patient states he does have a ride to this appointment. He also reports that [facility] home care has not contacted him to schedule an appointment for evaluation yet. I will reach out to them to be sure they have received referral information. Patient also reports that he was due for dialysis today but missed this appointment because [facility] was leaving his driveway by the time he was able to answer the door. He says he has been able to contact [facility] for transportation scheduled for tomorrow and set up an exact time for pickup so that he can be ready. No other issues voiced. States his biggest concern is that he receives services from home health agency.  Patient has contact information should additional barriers or questions arise. Cletis Media RN BSN CWS [place]

## 2019-01-10 ENCOUNTER — Ambulatory Visit: Payer: Medicare HMO | Admitting: Physician Assistant

## 2019-01-16 ENCOUNTER — Ambulatory Visit: Payer: Medicare HMO | Admitting: Family

## 2019-01-18 ENCOUNTER — Emergency Department (HOSPITAL_COMMUNITY)
Admission: EM | Admit: 2019-01-18 | Discharge: 2019-01-19 | Disposition: A | Discharge status: Home / Self Care | Payer: Medicare HMO | Attending: Emergency Medicine | Admitting: Emergency Medicine

## 2019-01-18 DIAGNOSIS — I120 Hypertensive chronic kidney disease with stage 5 chronic kidney disease or end stage renal disease: Secondary | ICD-10-CM | POA: Diagnosis not present

## 2019-01-18 DIAGNOSIS — R4182 Altered mental status, unspecified: Secondary | ICD-10-CM | POA: Diagnosis present

## 2019-01-18 DIAGNOSIS — E1122 Type 2 diabetes mellitus with diabetic chronic kidney disease: Secondary | ICD-10-CM | POA: Insufficient documentation

## 2019-01-18 DIAGNOSIS — Z79899 Other long term (current) drug therapy: Secondary | ICD-10-CM | POA: Insufficient documentation

## 2019-01-18 DIAGNOSIS — Z720 Tobacco use: Secondary | ICD-10-CM | POA: Insufficient documentation

## 2019-01-18 DIAGNOSIS — Z9889 Other specified postprocedural states: Secondary | ICD-10-CM | POA: Diagnosis not present

## 2019-01-18 DIAGNOSIS — R404 Transient alteration of awareness: Secondary | ICD-10-CM | POA: Insufficient documentation

## 2019-01-18 DIAGNOSIS — N186 End stage renal disease: Secondary | ICD-10-CM | POA: Diagnosis not present

## 2019-01-18 DIAGNOSIS — Z992 Dependence on renal dialysis: Secondary | ICD-10-CM | POA: Insufficient documentation

## 2019-01-18 LAB — CBC WITH DIFFERENTIAL/PLATELET
Abs Immature Granulocytes: 0.03 10*3/uL (ref 0.00–0.07)
Basophils Absolute: 0.1 10*3/uL (ref 0.0–0.1)
Basophils Relative: 1 %
Eosinophils Absolute: 0.2 10*3/uL (ref 0.0–0.5)
Eosinophils Relative: 3 %
HCT: 31.4 % — ABNORMAL LOW (ref 39.0–52.0)
Hemoglobin: 9.6 g/dL — ABNORMAL LOW (ref 13.0–17.0)
Immature Granulocytes: 0 %
Lymphocytes Relative: 7 %
Lymphs Abs: 0.5 10*3/uL — ABNORMAL LOW (ref 0.7–4.0)
MCH: 29.4 pg (ref 26.0–34.0)
MCHC: 30.6 g/dL (ref 30.0–36.0)
MCV: 96 fL (ref 80.0–100.0)
Monocytes Absolute: 0.7 10*3/uL (ref 0.1–1.0)
Monocytes Relative: 10 %
Neutro Abs: 5.9 10*3/uL (ref 1.7–7.7)
Neutrophils Relative %: 79 %
Platelets: 152 10*3/uL (ref 150–400)
RBC: 3.27 MIL/uL — ABNORMAL LOW (ref 4.22–5.81)
RDW: 15.3 % (ref 11.5–15.5)
WBC: 7.5 10*3/uL (ref 4.0–10.5)
nRBC: 0.7 % — ABNORMAL HIGH (ref 0.0–0.2)

## 2019-01-18 LAB — BASIC METABOLIC PANEL
Anion gap: 14 (ref 5–15)
BUN: 23 mg/dL — ABNORMAL HIGH (ref 6–20)
CO2: 27 mmol/L (ref 22–32)
Calcium: 8.7 mg/dL — ABNORMAL LOW (ref 8.9–10.3)
Chloride: 95 mmol/L — ABNORMAL LOW (ref 98–111)
Creatinine, Ser: 4.91 mg/dL — ABNORMAL HIGH (ref 0.61–1.24)
GFR calc Af Amer: 14 mL/min — ABNORMAL LOW (ref >60)
GFR calc non Af Amer: 12 mL/min — ABNORMAL LOW (ref >60)
Glucose, Bld: 91 mg/dL (ref 70–99)
Potassium: 5.3 mmol/L — ABNORMAL HIGH (ref 3.5–5.1)
Sodium: 136 mmol/L (ref 135–145)

## 2019-01-18 NOTE — ED Triage Notes (Signed)
Pt was at dialysis, finished treatment and had AMS. Pt did not know where he was, kept saying he was at [facility] while at [facility] center. Dialysis reported he was acting confused prior to dialysis. Oriented to self only. Pt noncompliant with dialysis and with DM meds 136/84  HR 82  100% RA CBG 98  Temp 98.4

## 2019-01-18 NOTE — Social Work (Signed)
EDCSW was contatcted by [employer] and told that daughter was found at home and agreed to accept wheel chair delivery  Daughter, Jacqueline's cell # [phone]

## 2019-01-18 NOTE — Discharge Instructions (Signed)
Continue your usual dialysis treatment.  Make sure you are following a renal diet, as instructed.  Return here, if needed, for problems.

## 2019-01-18 NOTE — ED Provider Notes (Signed)
[facility]ERG[facility]Y DEPARTMENT Provider Note   CSN: [id number] Arrival date & time: 01/18/19  6967     History Chief Complaint  Patient presents with  . Altered Mental Status    Matthew Kline is a 59 y.o. male.  HPI He presents for evaluation of altered mental status which occurred after dialysis today.  Apparently is improving.  Transferred by EMS for evaluation. No preceding symptoms known to the patient.  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  He denies recent illnesses.  Patient is frequent ED visits, and is frequently noncompliant with dialysis.  There are no other known modifying factors.   Past Medical History:  Diagnosis Date  . Anemia, unspecified   . Anxiety   . Arthralgia 2010   polyarticular  . Arthritis    "back, knees" (01/10/2017)  . Cancer [facility])    "kidney area" (01/10/2017)  . CHF (congestive heart failure) ([place]) 07/25/2009   denies  . Chronic lower back pain   . Coronary artery disease   . Coughing    pt. reports that he has drainage from sinus infection  . Diabetic foot ulcer ([place])   . Diabetic neuropathy ([place])   . ESRD (end stage renal disease) on dialysis [facility])    started 12/2012; "MWF; [place] "  (01/10/2017)  . GERD (gastroesophageal reflux disease)    hx "before I lost weight", no problem 9 years  . Hemodialysis access site with mature fistula ([place])   . Hemorrhoids, internal 10/2011   small  . High cholesterol   . History of blood transfusion    "related to the anemia"  . Hypertension   . Insomnia, unspecified   . Lacunar infarction ([place]) 2006   RUE/RLE, speech  . Long term (current) use of anticoagulants   . Myocardial infarction ([place]) 1995  . Orthostatic hypotension   . Osteomyelitis of foot, left, acute ([place])   . Other chronic postoperative pain   . Pneumonia    "probably twice" (01/10/2017)  . Polymyalgia rheumatica ([place])   . Renal insufficiency   . Sleep  apnea    "lost weight; no more problem" (01/10/2017)  . Stroke ([place]) 01/10/06   denies residual on 05/09/2014  . Type II diabetes mellitus ([place]) dx'd 1995  . Unspecified hereditary and idiopathic peripheral neuropathy    feet  . Unspecified osteomyelitis, site unspecified   . Unspecified vitamin D deficiency     Patient Active Problem List   Diagnosis Date Noted  . Below-knee amputation of right lower extremity ([place])   . Abscess of tendon sheath, right ankle and foot   . Diabetic polyneuropathy associated with type 2 diabetes mellitus ([place])   . Moderate protein-calorie malnutrition ([place])   . Cellulitis and abscess of toe of right foot 12/29/2018  . Cellulitis of scrotum 11/27/2018  . Sepsis ([place]) 11/24/2018  . Thrombocytopenia ([place]) 11/24/2018  . Bilateral inguinal hernia 11/21/2018  . Biliary colic 89/38/1017  . Acute cholecystitis 07/31/2018  . Prolonged QT interval 07/31/2018  . Acute respiratory failure with hypoxia ([place]) 07/10/2018  . Abdominal pain 07/10/2018  . Bilateral recurrent inguinal hernia without obstruction or gangrene   . Altered mental status   . Cerebral thrombosis with cerebral infarction 02/06/2017  . Cerebral embolism with cerebral infarction 02/06/2017  . Hx of AKA (above knee amputation), left ([place])   . Pressure injury of skin 01/30/2017  . Acute lower UTI 01/29/2017  . Acute metabolic  encephalopathy 01/29/2017  . UTI (urinary tract infection) 01/29/2017  . Quadriceps muscle rupture, left, initial encounter   . Fall 01/09/2017  . Gait disturbance 01/09/2017  . Staphylococcus aureus infection 01/09/2017  . Infection of prosthetic left knee joint ([place]) 01/09/2017  . Fever, unknown origin 11/09/2016  . Critical lower limb ischemia 08/25/2016  . Ulcer of left midfoot with fat layer exposed ([place]) 08/13/2016  . Diabetic ulcer of left midfoot associated with type 2 diabetes mellitus, with fat layer exposed ([place]) 07/25/2016  . Peripheral neuropathy  07/22/2016  . Tobacco abuse 07/22/2016  . CAD in native artery 05/18/2016  . CAD, multiple vessel 05/11/2016  . Positive cardiac stress test 05/11/2016  . Abnormal stress test 04/30/2016  . Pre-transplant evaluation for kidney transplant 04/30/2016  . S/P revision of total knee 11/26/2015  . Pain in the chest   . Acute on chronic diastolic heart failure ([place]) 07/05/2015  . Volume overload 07/04/2015  . Shortness of breath 07/04/2015  . Hypoxemia 07/04/2015  . Elevated troponin   . End-stage renal disease on hemodialysis ([place])   . Hypervolemia   . 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 Pyogenic bacterial arthritis of knee, left ([place]) 08/07/2014  . Tachycardia 07/24/2014  . Acute upper respiratory infection 07/24/2014  . ESRD on dialysis ([place]) 07/14/2014  . Type II diabetes mellitus ([place]) 07/14/2014  . Anemia in chronic kidney disease 07/14/2014  . Congestive heart failure (CHF) ([place]) 07/13/2014  . Surgical wound dehiscence 05/09/2014  . Dehiscence of closure of skin 05/09/2014  . Total knee replacement status 04/10/2014  . Diabetes mellitus with renal manifestations, controlled ([place]) 10/24/2013  . Hypertensive renal disease 06/27/2013  . DM type 2 causing vascular disease ([place]) 06/27/2013  . Erectile dysfunction 06/27/2013  . Depression 06/27/2013  . Claudication of left lower extremity ([place]) 12/19/2012  . Essential hypertension, benign 12/19/2012  . Sinusitis, acute maxillary 11/22/2012  . Otitis, externa, infective 11/14/2012  . Leg edema, left 11/14/2012  . End stage renal disease ([place]) 10/02/2012  . Controlled type 2 DM with proteinuria or microalbuminuria 09/19/2012  . GERD (gastroesophageal reflux disease) 09/19/2012  . Leukocytosis 09/19/2012  . Lacunar infarction ([place]) 08/17/2012  . Polymyalgia rheumatica ([place]) 08/17/2012  . 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 Essential hypertension 05/10/2012  . Vitamin D deficiency 05/10/2012  . Diabetes mellitus  due to underlying condition ([place]) 05/10/2012  . Hyperlipidemia LDL goal <100 05/10/2012  . Anemia of chronic disease 05/10/2012  . Screening for prostate cancer 05/10/2012  . Chronic kidney disease (CKD), stage IV (severe) ([place]) 05/10/2012  . Peripheral autonomic neuropathy due to DM ([place]) 05/10/2012  . Callus of foot 05/10/2012  . Urgency of urination 05/10/2012  . Hyperkalemia 05/10/2012  . Candidiasis of the esophagus 10/12/2011  . Internal hemorrhoids without mention of complication 37/85/8850  . Pre-syncope 07/25/2009  . DJD (degenerative joint disease) of cervical spine 02/17/2009    Past Surgical History:  Procedure Laterality Date  . ABDOMINAL AORTOGRAM N/A 08/25/2016   Procedure: ABDOMINAL AORTOGRAM;  Surgeon: Wellington Hampshire, MD;  Location: [facility];  Service: Cardiovascular;  Laterality: N/A;  . AMPUTATION  01/21/2012   Procedure: AMPUTATION RAY;  Surgeon: [name], MD;  Location: [facility];  Service: Orthopedics;  Laterality: Left;  Left Foot 4th Ray Amputation  . AMPUTATION Left 05/04/2013   Procedure: AMPUTATION DIGIT;  Surgeon: [name], MD;  Location: [facility];  Service: Orthopedics;  Laterality: Left;  Left Great Toe Amputation at  MTP  . AMPUTATION Left 01/14/2017   Procedure: AMPUTATION ABOVE LEFT KNEE;  Surgeon: [name], MD;  Location: [facility];  Service: Orthopedics;  Laterality: Left;  . AMPUTATION Right 12/31/2018   Procedure: AMPUTATION BELOW KNEE;  Surgeon: [name], MD;  Location: [facility];  Service: Orthopedics;  Laterality: Right;  . ANTERIOR CERVICAL DECOMP/DISCECTOMY FUSION  02/2011  . APPLICATION OF WOUND VAC Right 12/31/2018   Procedure: Application Of Wound Vac;  Surgeon: [name], MD;  Location: [facility];  Service: Orthopedics;  Laterality: Right;  . BACK SURGERY    . BASCILIC VEIN TRANSPOSITION Left 10/19/2012   Procedure: BASCILIC VEIN TRANSPOSITION;  Surgeon: Serafina Mitchell, MD;  Location: [facility];  Service: Vascular;  Laterality:  Left;  . CARDIAC CATHETERIZATION     "before bypass"  . CHOLECYSTECTOMY N/A 08/02/2018   Procedure: LAPAROSCOPIC CHOLECYSTECTOMY WITH INTRAOPERATIVE CHOLANGIOGRAM;  Surgeon: Donnie Mesa, MD;  Location: [facility];  Service: General;  Laterality: N/A;  . CORONARY ARTERY BYPASS GRAFT     x 5 with lima at [place] SPACERS Left 08/07/2014   Procedure: Replace Left Total Knee Arthroplasty,  Place Antibiotic Spacer;  Surgeon: [name], MD;  Location: [facility];  Service: Orthopedics;  Laterality: Left;  . I & D EXTREMITY Left 05/09/2014   Procedure: Irrigation and Debridement Left Knee and Closure of Total Knee Arthroplasty Incision;  Surgeon: [name], MD;  Location: [facility];  Service: Orthopedics;  Laterality: Left;  . I & D KNEE WITH POLY EXCHANGE Left 05/31/2014   Procedure: IRRIGATION AND DEBRIDEMENT LEFT KNEE, PLACE ANTIBIOTIC BEADS,  POLY EXCHANGE;  Surgeon: [name], MD;  Location: [facility] [place];  Service: Orthopedics;  Laterality: Left;  . INGUINAL HERNIA REPAIR Bilateral 11/21/2018   Procedure: BILATERAL INGUINAL HERNIA REPAIR WITH MESH;  Surgeon: Coralie Keens, MD;  Location: [facility];  Service: General;  Laterality: Bilateral;  . IRRIGATION AND DEBRIDEMENT KNEE Left 01/12/2017   Procedure: IRRIGATION AND DEBRIDEMENT LEFT KNEE;  Surgeon: [name], MD;  Location: [facility];  Service: Orthopedics;  Laterality: Left;  . JOINT REPLACEMENT    . KNEE ARTHROSCOPY Left 08-25-2012  . LOWER EXTREMITY ANGIOGRAPHY Left 08/25/2016   Procedure: Lower Extremity Angiography;  Surgeon: Wellington Hampshire, MD;  Location: [facility];  Service: Cardiovascular;  Laterality: Left;  . PERIPHERAL VASCULAR BALLOON ANGIOPLASTY Left 08/25/2016   Procedure: PERIPHERAL VASCULAR BALLOON ANGIOPLASTY;  Surgeon: Wellington Hampshire, MD;  Location: [facility];  Service: Cardiovascular;  Laterality: Left;  lt peroneal and ant tibial arteries cutting balloon    . REFRACTIVE SURGERY Bilateral   . TOE AMPUTATION Bilateral    "I've lost 7 toes over the last 7 years" (05/09/2014)  . TOE SURGERY Left April 2015   Big toe removed on left foot.  . TONSILLECTOMY    . TOTAL KNEE ARTHROPLASTY Left 04/10/2014   Procedure: TOTAL KNEE ARTHROPLASTY;  Surgeon: [name], MD;  Location: [facility];  Service: Orthopedics;  Laterality: Left;  . TOTAL KNEE REVISION Left 10/25/2014   Procedure: LEFT TOTAL KNEE REVISION;  Surgeon: [name], MD;  Location: [facility];  Service: Orthopedics;  Laterality: Left;  . TOTAL KNEE REVISION Left 11/26/2015   Procedure: Removal Left Total Knee Arthroplasty, Hinged Total Knee Arthroplasty;  Surgeon: [name], MD;  Location: [facility];  Service: Orthopedics;  Laterality: Left;  . UVULOPALATOPHARYNGOPLASTY, TONSILLECTOMY AND SEPTOPLASTY  ~  1989  . WOUND DEBRIDEMENT Left 05/09/2014   Dehiscence Left Total Knee Arthroplasty Incision       Family History  Problem Relation Kline of Onset  . Hypertension Mother   . Cancer Mother 51       Ovarian  . Heart disease Maternal Aunt   . Stroke Maternal Grandfather     Social History   Tobacco Use  . Smoking status: Current Some Day Smoker    Packs/day: 0.12    Years: 32.00    Pack years: 3.84    Types: Cigarettes  . Smokeless tobacco: Never Used  . Tobacco comment: 1 per week  Substance Use Topics  . Alcohol use: No    Alcohol/week: 0.0 standard drinks  . Drug use: No    Home Medications Prior to Admission medications   Medication Sig Start Date End Date Taking? Authorizing Provider  acetaminophen (TYLENOL) 500 MG tablet Take 500-1,000 mg by mouth every 6 (six) hours as needed for mild pain or fever.     [provider]  Amino Acids-Protein Hydrolys (FEEDING SUPPLEMENT, PRO-STAT SUGAR FREE 64,) LIQD Take 30 mLs by mouth 2 (two) times daily. 01/05/19   Debbe Odea, MD  amLODipine (NORVASC) 5 MG tablet Take 5 mg by mouth every evening.     [provider]   aspirin EC 81 MG EC tablet Take 2 tablets (162 mg total) by mouth daily. Patient taking differently: Take 81 mg by mouth at bedtime.  01/20/17   Doreatha Lew, MD  bisacodyl (DULCOLAX) 10 MG suppository Place 1 suppository (10 mg total) rectally daily as needed for moderate constipation. 01/05/19   Debbe Odea, MD  carvedilol (COREG) 12.5 MG tablet Take 1 tablet (12.5 mg total) by mouth 2 (two) times daily with a meal. 02/08/17   Bonnell Public, MD  Darbepoetin Alfa (ARANESP) 150 MCG/0.3ML SOSY injection Inject 0.3 mLs (150 mcg total) into the vein every Friday with hemodialysis. 02/11/17   Bonnell Public, MD  docusate sodium (COLACE) 100 MG capsule Take 1 capsule (100 mg total) by mouth 2 (two) times daily. 01/05/19   Debbe Odea, MD  gabapentin (NEURONTIN) 100 MG capsule Take 200 mg by mouth daily.  09/27/18   [provider]  heparin 1000 UNIT/ML injection Inject 4 mLs into the vein See admin instructions. Every hemodialysis treatment 08/14/18 08/13/19  [provider]  hydrOXYzine (ATARAX/VISTARIL) 50 MG tablet Take 50 mg by mouth 3 (three) times daily as needed for itching.     [provider]  menthol-cetylpyridinium (CEPACOL) 3 MG lozenge Take 1 lozenge (3 mg total) by mouth as needed for sore throat. 01/05/19   Debbe Odea, MD  methocarbamol (ROBAXIN) 500 MG tablet Take 1 tablet (500 mg total) by mouth every 6 (six) hours as needed for muscle spasms. 01/05/19   Debbe Odea, MD  montelukast (SINGULAIR) 10 MG tablet Take 10 mg by mouth at bedtime as needed. 08/14/18   [provider]  multivitamin (RENA-VIT) TABS tablet Take 1 tablet by mouth at bedtime. 02/08/17   Dana Allan I, MD  oxyCODONE (OXY IR/ROXICODONE) 5 MG immediate release tablet Take 1 tablet (5 mg total) by mouth every 6 (six) hours as needed for moderate pain, severe pain or breakthrough pain. 01/05/19   Debbe Odea, MD  polyethylene glycol (MIRALAX / GLYCOLAX) 17 g packet  Take 17 g by mouth daily as needed for mild constipation. 01/05/19   Debbe Odea, MD  sevelamer carbonate (RENVELA) 800 MG tablet  Take 1,600-3,200 mg by mouth 3 (three) times daily with meals. 2-4 tabs with each meal    [provider]  temazepam (RESTORIL) 30 MG capsule Take 30 mg by mouth at bedtime.     [provider]    Allergies    Morphine and related, Tygacil [tigecycline], and Imodium [loperamide]  Review of Systems   Review of Systems  All other systems reviewed and are negative.   Physical Exam Updated Vital Signs BP (!) 143/92   Pulse 72   Temp 97.6 F (36.4 C) (Oral)   Resp 19   SpO2 92%   Physical Exam Vitals and nursing note reviewed.  Constitutional:      Appearance: He is well-developed.  HENT:     Head: Normocephalic and atraumatic.     Right Ear: External ear normal.     Left Ear: External ear normal.     Nose: No congestion or rhinorrhea.  Eyes:     Conjunctiva/sclera: Conjunctivae normal.     Pupils: Pupils are equal, round, and reactive to light.  Neck:     Trachea: Phonation normal.  Cardiovascular:     Rate and Rhythm: Normal rate and regular rhythm.     Heart sounds: Normal heart sounds.  Pulmonary:     Effort: Pulmonary effort is normal. No respiratory distress.     Breath sounds: Normal breath sounds. No stridor.  Abdominal:     General: There is no distension.     Palpations: Abdomen is soft.     Tenderness: There is no abdominal tenderness.  Musculoskeletal:        General: Normal range of motion.     Cervical back: Normal range of motion and neck supple.     Comments: Left leg AKA, right leg BKA.  Legs nontender to palpation.    Skin:    General: Skin is warm and dry.  Neurological:     Mental Status: He is alert and oriented to person, place, and time.     Cranial Nerves: No cranial nerve deficit.     Sensory: No sensory deficit.     Motor: No abnormal muscle tone.     Coordination: Coordination normal.      Comments: No dysarthria or aphasia.  Psychiatric:        Mood and Affect: Mood normal.        Behavior: Behavior normal.        Thought Content: Thought content normal.        Judgment: Judgment normal.     ED Results / Procedures / Treatments   Labs (all labs ordered are listed, but only abnormal results are displayed) Labs Reviewed  CBC WITH DIFFERENTIAL/PLATELET - Abnormal; Notable for the following components:      Result Value   RBC 3.27 (*)    Hemoglobin 9.6 (*)    HCT 31.4 (*)    nRBC 0.7 (*)    Lymphs Abs 0.5 (*)    All other components within normal limits  BASIC METABOLIC PANEL - Abnormal; Notable for the following components:   Potassium 5.3 (*)    Chloride 95 (*)    BUN 23 (*)    Creatinine, Ser 4.91 (*)    Calcium 8.7 (*)    GFR calc non Af Amer 12 (*)    GFR calc Af Amer 14 (*)    All other components within normal limits    EKG None  Radiology No results found.  Procedures Procedures (including critical  care time)  Medications Ordered in ED Medications - No data to display  ED Course  I have reviewed the triage vital signs and the nursing notes.  Pertinent labs & imaging results that were available during my care of the patient were reviewed by me and considered in my medical decision making (see chart for details).  Clinical Course as of Jan 17 1837  Thu Jan 18, 2019  1658 Normal except potassium high, chloride low, BUN high, creatinine high, calcium low, GFR low  Basic metabolic panel(!) [EW]  0349 Normal except hemoglobin low  CBC with Differential(!) [EW]    Clinical Course User Index [EW] Daleen Bo, MD   MDM Rules/Calculators/A&P                       Patient Vitals for the past 24 hrs:  BP Temp Temp src Pulse Resp SpO2  01/18/19 1830 (!) 143/92 -- -- 72 19 92 %  01/18/19 1815 (!) 144/98 -- -- 76 14 92 %  01/18/19 1800 (!) 145/90 -- -- 76 16 95 %  01/18/19 1745 (!) 146/90 -- -- 77 12 94 %  01/18/19 1730 133/81 -- -- 75 12  92 %  01/18/19 1717 130/79 97.6 F (36.4 C) Oral 76 18 93 %    6:36 PM Reevaluation with update and discussion. After initial assessment and treatment, an updated evaluation reveals patient is sleeping but arouses immediately on stimulation.  No dysarthria or aphasia.  He has reportedly been able to drink fluids, by nursing.  Findings discussed and questions answered. Daleen Bo   Medical Decision Making: Patient presenting for altered mental status post dialysis.  No evidence for acute central nervous system process, infection or metabolic instability.  Stable for discharge.  Matthew Kline was evaluated in Emergency Department on 01/18/2019 for the symptoms described in the history of present illness. He was evaluated in the context of the global COVID-19 pandemic, which necessitated consideration that the patient might be at risk for infection with the SARS-CoV-2 virus that causes COVID-19. Institutional protocols and algorithms that pertain to the evaluation of patients at risk for COVID-19 are in a state of rapid change based on information released by regulatory bodies including the CDC and federal and state organizations. These policies and algorithms were followed during the patient's care in the ED.   CRITICAL CARE-no Performed by: Daleen Bo  Nursing Notes Reviewed/ Care Coordinated Applicable Imaging Reviewed Interpretation of Laboratory Data incorporated into ED treatment  The patient appears reasonably screened and/or stabilized for discharge and I doubt any other medical condition or other [facility] requiring further screening, evaluation, or treatment in the ED at this time prior to discharge.  Plan: Home Medications-continue usual; Home Treatments-rest, usual diet; return here if the recommended treatment, does not improve the symptoms; Recommended follow up-continue dialysis as planned and scheduled  Final Clinical Impression(s) / ED Diagnoses Final diagnoses:  Transient  alteration of awareness  End stage renal disease [facility])    Rx / DC Orders ED Discharge Orders    None       Daleen Bo, MD 01/18/19 1840

## 2019-01-18 NOTE — Social Work (Addendum)
EDCSW contacted [facility] PD to request a welfare check at Pts address in order to ensure that there is a person in the home to coordinate discharge and separate sending of wheelchair.  CSW also attempted to reach emergency contacts listed in PTs file via phone in order to coordinate discharge

## 2019-01-18 NOTE — ED Notes (Signed)
[facility] called to transport pt home 

## 2019-01-18 NOTE — ED Notes (Signed)
Attempted to call pt's daughter who lives with pt.  No answer, message left for her to call ED

## 2019-01-18 NOTE — ED Notes (Signed)
Pt given diet ginger ale   Pt only drank 2 swallows.  St's he doesn't want anything to eat or drink.  Encouraged pt to drink and left at bedside

## 2019-01-18 NOTE — Social Work (Signed)
[facility] Contacted [employer] and engaged them to deliver wheelchair. CSW met [employer] driver at ED entrance and passed off wheelchair for delivery to [name].  CSW then called daughter on cell to confirm that she would take delivery. Daughter asked to be notified via phone on cell when PT leaves via [facility]. CSW informed RN on duty of daughter's request.   CSW also returned calls to emergency contacts on PTs Facesheet as CSW had previously contacted [facility] and Mother Geni Bers in an effort to discover whether daughter was at home to coordinate safe discharge.  After wheelchair had been dispatched CSW brought PT a sandwich and gingerale from nourishment room.    Vergie Living MSW LCSWA Transitions of Care  Clinical Social Worker  [facility] Emergency Departments  [phone]

## 2019-01-19 NOTE — ED Notes (Signed)
Patient's daughter advised patient is enroute via [facility] back home-Monique,RN

## 2019-01-25 ENCOUNTER — Other Ambulatory Visit: Payer: Self-pay

## 2019-01-25 ENCOUNTER — Encounter: Payer: Self-pay | Admitting: Physician Assistant

## 2019-01-25 ENCOUNTER — Ambulatory Visit (INDEPENDENT_AMBULATORY_CARE_PROVIDER_SITE_OTHER): Payer: Medicare HMO

## 2019-01-25 ENCOUNTER — Ambulatory Visit (INDEPENDENT_AMBULATORY_CARE_PROVIDER_SITE_OTHER): Payer: Medicare HMO | Admitting: Physician Assistant

## 2019-01-25 VITALS — Ht 71.5 in | Wt 138.7 lb

## 2019-01-25 DIAGNOSIS — Z89511 Acquired absence of right leg below knee: Secondary | ICD-10-CM

## 2019-01-25 DIAGNOSIS — M25559 Pain in unspecified hip: Secondary | ICD-10-CM

## 2019-01-25 DIAGNOSIS — M25552 Pain in left hip: Secondary | ICD-10-CM | POA: Diagnosis not present

## 2019-01-25 DIAGNOSIS — S88119A Complete traumatic amputation at level between knee and ankle, unspecified lower leg, initial encounter: Secondary | ICD-10-CM

## 2019-01-25 DIAGNOSIS — M25551 Pain in right hip: Secondary | ICD-10-CM

## 2019-01-25 MED ORDER — OXYCODONE HCL 5 MG PO TABS: 5.0000 mg | ORAL_TABLET | Freq: Four times a day (QID) | ORAL | 0 refills | Status: DC | PRN

## 2019-01-25 NOTE — Progress Notes (Signed)
Office Visit Note   Patient: Matthew Kline           Date of Birth: 11-05-1960           MRN: [id number] Visit Date: 01/25/2019              Requested by: [facility] [address] [place],  [place] [id number] PCP: [place]  Chief Complaint  Patient presents with  . Right Leg - Routine Post Op    12/31/2018 right BKA        HPI: This is a pleasant gentleman who is approximately 3-1/2 weeks status post right below-knee amputation.  With regards to this he is doing quite well.  He is also status post left above-knee amputation.  He states he fell about a week ago on the floor in the bathroom.  he has had pain in his groin since which makes it difficult for him to sleep at night  Assessment & Plan: Visit Diagnoses:  1. Hip pain     Plan: Left pelvic fracture.  The natural history of this as he is nonweightbearing on this side this would be the appropriate treatment anyways.  He understands that this will take 6 to 8 weeks to heal and he may have some continued pain.  With regards to the right side he will follow up in 1 week and hopefully we can remove the staples  Follow-Up Instructions: No follow-ups on file.   Ortho Exam  Patient is alert, oriented, no adenopathy, well-dressed, normal affect, normal respiratory effort. Right BKA: Swelling is well controlled there is no drainage wound is healing quite well he does have some erythema around the wound which improves when he elevates his leg no cellulitis  Left above-knee amputation: He has a lot of fasciculations.  He is tender in the groin.  Imaging: No results found. No images are attached to the encounter.  Labs: Lab Results  Component Value Date   HGBA1C 5.7 (H) 10/03/2018   HGBA1C 5.7 (H) 08/01/2018   HGBA1C 5.3 07/10/2018   ESRSEDRATE 57 (H) 12/30/2018   ESRSEDRATE 80 (H) 11/29/2015   ESRSEDRATE 75 (H) 08/07/2014   CRP 7.6 (H) 12/30/2018   CRP 9.8 (H) 07/14/2016   CRP 20.4  (H) 11/29/2015   REPTSTATUS 11/29/2018 FINAL 11/24/2018   GRAMSTAIN  01/12/2017    RARE WBC PRESENT, PREDOMINANTLY MONONUCLEAR FEW GRAM POSITIVE COCCI IN PAIRS    CULT  11/24/2018    NO GROWTH 5 DAYS Performed at [facility], [place] [address], [place],  [address]    LABORGA STAPHYLOCOCCUS AUREUS 01/12/2017     Lab Results  Component Value Date   ALBUMIN 2.8 (L) 01/01/2019   ALBUMIN 3.0 (L) 12/29/2018   ALBUMIN 2.8 (L) 11/26/2018   PREALBUMIN 13.3 (L) 12/30/2018    Lab Results  Component Value Date   MG 2.1 08/02/2018   MG 2.2 08/01/2018   MG 1.9 01/10/2012   No results found for: [facility]  Lab Results  Component Value Date   PREALBUMIN 13.3 (L) 12/30/2018   CBC EXTENDED Latest Ref Rng & Units 01/18/2019 01/02/2019 01/01/2019  WBC 4.0 - 10.5 K/uL 7.5 9.0 8.5  RBC 4.22 - 5.81 MIL/uL 3.27(L) 3.38(L) 3.21(L)  HGB 13.0 - 17.0 g/dL 9.6(L) 10.1(L) 9.5(L)  HCT 39.0 - 52.0 % 31.4(L) 31.9(L) 29.8(L)  PLT 150 - 400 K/uL 152 187 196  NEUTROABS 1.7 - 7.7 K/uL 5.9 - -  LYMPHSABS 0.7 - 4.0 K/uL 0.5(L) - -  Body mass index is 19.07 kg/m.  Orders:  Orders Placed This Encounter  Procedures  . XR HIP UNILAT W OR W/O PELVIS 2-3 VIEWS LEFT   Meds ordered this encounter  Medications  . oxyCODONE (OXY IR/ROXICODONE) 5 MG immediate release tablet    Sig: Take 1 tablet (5 mg total) by mouth every 6 (six) hours as needed for moderate pain, severe pain or breakthrough pain.    Dispense:  30 tablet    Refill:  0     Procedures: No procedures performed  Clinical Data: No additional findings.  ROS:  All other systems negative, except as noted in the HPI. Review of Systems  Objective: Vital Signs: Ht 5' 11.5" (1.816 m)   Wt 138 lb 10.7 oz (62.9 kg)   BMI 19.07 kg/m   Specialty Comments:  No specialty comments available.  PMFS History: Patient Active Problem List   Diagnosis Date Noted  . Below-knee amputation of right lower extremity ([place])   . Abscess  of tendon sheath, right ankle and foot   . Diabetic polyneuropathy associated with type 2 diabetes mellitus ([place])   . Moderate protein-calorie malnutrition ([place])   . Cellulitis and abscess of toe of right foot 12/29/2018  . Cellulitis of scrotum 11/27/2018  . Sepsis ([place]) 11/24/2018  . Thrombocytopenia ([place]) 11/24/2018  . Bilateral inguinal hernia 11/21/2018  . Biliary colic 01/19/3233  . Acute cholecystitis 07/31/2018  . Prolonged QT interval 07/31/2018  . Acute respiratory failure with hypoxia ([place]) 07/10/2018  . Abdominal pain 07/10/2018  . Bilateral recurrent inguinal hernia without obstruction or gangrene   . Altered mental status   . Cerebral thrombosis with cerebral infarction 02/06/2017  . Cerebral embolism with cerebral infarction 02/06/2017  . Hx of AKA (above knee amputation), left ([place])   . Pressure injury of skin 01/30/2017  . Acute lower UTI 01/29/2017  . Acute metabolic encephalopathy 57/32/2025  . UTI (urinary tract infection) 01/29/2017  . Quadriceps muscle rupture, left, initial encounter   . Fall 01/09/2017  . Gait disturbance 01/09/2017  . Staphylococcus aureus infection 01/09/2017  . Infection of prosthetic left knee joint ([place]) 01/09/2017  . Fever, unknown origin 11/09/2016  . Critical lower limb ischemia 08/25/2016  . Ulcer of left midfoot with fat layer exposed ([place]) 08/13/2016  . Diabetic ulcer of left midfoot associated with type 2 diabetes mellitus, with fat layer exposed ([place]) 07/25/2016  . Peripheral neuropathy 07/22/2016  . Tobacco abuse 07/22/2016  . CAD in native artery 05/18/2016  . CAD, multiple vessel 05/11/2016  . Positive cardiac stress test 05/11/2016  . Abnormal stress test 04/30/2016  . Pre-transplant evaluation for kidney transplant 04/30/2016  . S/P revision of total knee 11/26/2015  . Pain in the chest   . Acute on chronic diastolic heart failure ([place]) 07/05/2015  . Volume overload 07/04/2015  . Shortness of breath 07/04/2015  .  Hypoxemia 07/04/2015  . Elevated troponin   . End-stage renal disease on hemodialysis ([place])   . Hypervolemia   . 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 Pyogenic bacterial arthritis of knee, left ([place]) 08/07/2014  . Tachycardia 07/24/2014  . Acute upper respiratory infection 07/24/2014  . ESRD on dialysis ([place]) 07/14/2014  . Type II diabetes mellitus ([place]) 07/14/2014  . Anemia in chronic kidney disease 07/14/2014  . Congestive heart failure (CHF) ([place]) 07/13/2014  . Surgical wound dehiscence 05/09/2014  . Dehiscence of closure of skin 05/09/2014  . Total knee replacement status 04/10/2014  . Diabetes mellitus with renal  manifestations, controlled ([place]) 10/24/2013  . Hypertensive renal disease 06/27/2013  . DM type 2 causing vascular disease ([place]) 06/27/2013  . Erectile dysfunction 06/27/2013  . Depression 06/27/2013  . Claudication of left lower extremity ([place]) 12/19/2012  . Essential hypertension, benign 12/19/2012  . Sinusitis, acute maxillary 11/22/2012  . Otitis, externa, infective 11/14/2012  . Leg edema, left 11/14/2012  . End stage renal disease ([place]) 10/02/2012  . Controlled type 2 DM with proteinuria or microalbuminuria 09/19/2012  . GERD (gastroesophageal reflux disease) 09/19/2012  . Leukocytosis 09/19/2012  . Lacunar infarction ([place]) 08/17/2012  . Polymyalgia rheumatica ([place]) 08/17/2012  . 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 Essential hypertension 05/10/2012  . Vitamin D deficiency 05/10/2012  . Diabetes mellitus due to underlying condition ([place]) 05/10/2012  . Hyperlipidemia LDL goal <100 05/10/2012  . Anemia of chronic disease 05/10/2012  . Screening for prostate cancer 05/10/2012  . Chronic kidney disease (CKD), stage IV (severe) ([place]) 05/10/2012  . Peripheral autonomic neuropathy due to DM ([place]) 05/10/2012  . Callus of foot 05/10/2012  . Urgency of urination 05/10/2012  . Hyperkalemia 05/10/2012  . Candidiasis of the esophagus 10/12/2011  .  Internal hemorrhoids without mention of complication 93/79/0240  . Pre-syncope 07/25/2009  . DJD (degenerative joint disease) of cervical spine 02/17/2009   Past Medical History:  Diagnosis Date  . Anemia, unspecified   . Anxiety   . Arthralgia 2010   polyarticular  . Arthritis    "back, knees" (01/10/2017)  . Cancer [facility])    "kidney area" (01/10/2017)  . CHF (congestive heart failure) ([place]) 07/25/2009   denies  . Chronic lower back pain   . Coronary artery disease   . Coughing    pt. reports that he has drainage from sinus infection  . Diabetic foot ulcer ([place])   . Diabetic neuropathy ([place])   . ESRD (end stage renal disease) on dialysis [facility])    started 12/2012; "MWF; [place] "  (01/10/2017)  . GERD (gastroesophageal reflux disease)    hx "before I lost weight", no problem 9 years  . Hemodialysis access site with mature fistula ([place])   . Hemorrhoids, internal 10/2011   small  . High cholesterol   . History of blood transfusion    "related to the anemia"  . Hypertension   . Insomnia, unspecified   . Lacunar infarction ([place]) 2006   RUE/RLE, speech  . Long term (current) use of anticoagulants   . Myocardial infarction ([place]) 1995  . Orthostatic hypotension   . Osteomyelitis of foot, left, acute ([place])   . Other chronic postoperative pain   . Pneumonia    "probably twice" (01/10/2017)  . Polymyalgia rheumatica ([place])   . Renal insufficiency   . Sleep apnea    "lost weight; no more problem" (01/10/2017)  . Stroke ([place]) 01/10/06   denies residual on 05/09/2014  . Type II diabetes mellitus ([place]) dx'd 1995  . Unspecified hereditary and idiopathic peripheral neuropathy    feet  . Unspecified osteomyelitis, site unspecified   . Unspecified vitamin D deficiency     Family History  Problem Relation Age of Onset  . Hypertension Mother   . Cancer Mother 8       Ovarian  . Heart disease Maternal Aunt   . Stroke Maternal Grandfather     Past Surgical History:    Procedure Laterality Date  . ABDOMINAL AORTOGRAM N/A 08/25/2016   Procedure: ABDOMINAL AORTOGRAM;  Surgeon: Wellington Hampshire, MD;  Location: [facility] INVASIVE CV  LAB;  Service: Cardiovascular;  Laterality: N/A;  . AMPUTATION  01/21/2012   Procedure: AMPUTATION RAY;  Surgeon: [name], MD;  Location: [facility];  Service: Orthopedics;  Laterality: Left;  Left Foot 4th Ray Amputation  . AMPUTATION Left 05/04/2013   Procedure: AMPUTATION DIGIT;  Surgeon: [name], MD;  Location: [place];  Service: Orthopedics;  Laterality: Left;  Left Great Toe Amputation at MTP  . AMPUTATION Left 01/14/2017   Procedure: AMPUTATION ABOVE LEFT KNEE;  Surgeon: [name], MD;  Location: [place];  Service: Orthopedics;  Laterality: Left;  . AMPUTATION Right 12/31/2018   Procedure: AMPUTATION BELOW KNEE;  Surgeon: [name], MD;  Location: [place];  Service: Orthopedics;  Laterality: Right;  . ANTERIOR CERVICAL DECOMP/DISCECTOMY FUSION  02/2011  . APPLICATION OF WOUND VAC Right 12/31/2018   Procedure: Application Of Wound Vac;  Surgeon: [name], MD;  Location: [place];  Service: Orthopedics;  Laterality: Right;  . BACK SURGERY    . BASCILIC VEIN TRANSPOSITION Left 10/19/2012   Procedure: BASCILIC VEIN TRANSPOSITION;  Surgeon: Serafina Mitchell, MD;  Location: [place];  Service: Vascular;  Laterality: Left;  . CARDIAC CATHETERIZATION     "before bypass"  . CHOLECYSTECTOMY N/A 08/02/2018   Procedure: LAPAROSCOPIC CHOLECYSTECTOMY WITH INTRAOPERATIVE CHOLANGIOGRAM;  Surgeon: Donnie Mesa, MD;  Location: [place];  Service: General;  Laterality: N/A;  . CORONARY ARTERY BYPASS GRAFT     x 5 with lima at [place] SPACERS Left 08/07/2014   Procedure: Replace Left Total Knee Arthroplasty,  Place Antibiotic Spacer;  Surgeon: [name], MD;  Location: [place] [facility];  Service: Orthopedics;  Laterality: Left;  . I & D EXTREMITY Left 05/09/2014   Procedure: Irrigation and  Debridement Left Knee and Closure of Total Knee Arthroplasty Incision;  Surgeon: [name], MD;  Location: [place];  Service: Orthopedics;  Laterality: Left;  . I & D KNEE WITH POLY EXCHANGE Left 05/31/2014   Procedure: IRRIGATION AND DEBRIDEMENT LEFT KNEE, PLACE ANTIBIOTIC BEADS,  POLY EXCHANGE;  Surgeon: [name], MD;  Location: [place];  Service: Orthopedics;  Laterality: Left;  . INGUINAL HERNIA REPAIR Bilateral 11/21/2018   Procedure: BILATERAL INGUINAL HERNIA REPAIR WITH MESH;  Surgeon: Coralie Keens, MD;  Location: [place];  Service: General;  Laterality: Bilateral;  . IRRIGATION AND DEBRIDEMENT KNEE Left 01/12/2017   Procedure: IRRIGATION AND DEBRIDEMENT LEFT KNEE;  Surgeon: [name], MD;  Location: [place];  Service: Orthopedics;  Laterality: Left;  . JOINT REPLACEMENT    . KNEE ARTHROSCOPY Left 08-25-2012  . LOWER EXTREMITY ANGIOGRAPHY Left 08/25/2016   Procedure: Lower Extremity Angiography;  Surgeon: Wellington Hampshire, MD;  Location: [place] CV LAB;  Service: Cardiovascular;  Laterality: Left;  . PERIPHERAL VASCULAR BALLOON ANGIOPLASTY Left 08/25/2016   Procedure: PERIPHERAL VASCULAR BALLOON ANGIOPLASTY;  Surgeon: Wellington Hampshire, MD;  Location: [place] CV LAB;  Service: Cardiovascular;  Laterality: Left;  lt peroneal and ant tibial arteries cutting balloon  . REFRACTIVE SURGERY Bilateral   . TOE AMPUTATION Bilateral    "I've lost 7 toes over the last 7 years" (05/09/2014)  . TOE SURGERY Left April 2015   Big toe removed on left foot.  . TONSILLECTOMY    . TOTAL KNEE ARTHROPLASTY Left 04/10/2014   Procedure: TOTAL KNEE ARTHROPLASTY;  Surgeon: [name], MD;  Location: [place];  Service: Orthopedics;  Laterality: Left;  . TOTAL  KNEE REVISION Left 10/25/2014   Procedure: LEFT TOTAL KNEE REVISION;  Surgeon: [name], MD;  Location: [facility];  Service: Orthopedics;  Laterality: Left;  . TOTAL KNEE REVISION Left 11/26/2015   Procedure: Removal Left Total Knee  Arthroplasty, Hinged Total Knee Arthroplasty;  Surgeon: [name], MD;  Location: [facility];  Service: Orthopedics;  Laterality: Left;  . UVULOPALATOPHARYNGOPLASTY, TONSILLECTOMY AND SEPTOPLASTY  ~ 1989  . WOUND DEBRIDEMENT Left 05/09/2014   Dehiscence Left Total Knee Arthroplasty Incision   Social History   Occupational History  . Not on file  Tobacco Use  . Smoking status: Current Some Day Smoker    Packs/day: 0.12    Years: 32.00    Pack years: 3.84    Types: Cigarettes  . Smokeless tobacco: Never Used  . Tobacco comment: 1 per week  Substance and Sexual Activity  . Alcohol use: No    Alcohol/week: 0.0 standard drinks  . Drug use: No  . Sexual activity: Not Currently

## 2019-02-01 ENCOUNTER — Ambulatory Visit (INDEPENDENT_AMBULATORY_CARE_PROVIDER_SITE_OTHER): Payer: Medicare HMO | Admitting: Physician Assistant

## 2019-02-01 ENCOUNTER — Encounter: Payer: Self-pay | Admitting: Physician Assistant

## 2019-02-01 ENCOUNTER — Other Ambulatory Visit: Payer: Self-pay

## 2019-02-01 VITALS — Ht 71.0 in | Wt 138.0 lb

## 2019-02-01 DIAGNOSIS — M65071 Abscess of tendon sheath, right ankle and foot: Secondary | ICD-10-CM

## 2019-02-01 NOTE — Progress Notes (Signed)
Office Visit Note   Patient: Matthew Kline           Date of Birth: 1960-07-11           MRN: [id number] Visit Date: 02/01/2019              Requested by: [facility] [address] [place],  [place] [id number] PCP: [place]  Chief Complaint  Patient presents with  . Right Leg - Routine Post Op    12/31/18 right BKA   . Left Hip - Follow-up    S/p fall several weeks ago pelvic fx       HPI: Patient is 1 month status post right below-knee amputation he is also approximately 2 weeks status post nondisplaced left pelvic fracture his pain has decreased a little bit in his left groin.  With regards to his right below-knee amputation stump he is doing well  Assessment & Plan: Visit Diagnoses: No diagnosis found.  Plan: We have given him a prescription for extra-large shrinker and a prescription for a prosthetic.  He will have his staples removed today follow-up in 2 weeks at which time an x-ray of his pelvis should be taken  Follow-Up Instructions: No follow-ups on file.   Ortho Exam  Patient is alert, oriented, no adenopathy, well-dressed, normal affect, normal respiratory effort. Wound edges have healed.  He does have some erythema at the stump which improves with elevation.  Probably secondary to not having an appropriate size shrinker.  No fluctuance no cellulitis Imaging: No results found.   Labs: Lab Results  Component Value Date   HGBA1C 5.7 (H) 10/03/2018   HGBA1C 5.7 (H) 08/01/2018   HGBA1C 5.3 07/10/2018   ESRSEDRATE 57 (H) 12/30/2018   ESRSEDRATE 80 (H) 11/29/2015   ESRSEDRATE 75 (H) 08/07/2014   CRP 7.6 (H) 12/30/2018   CRP 9.8 (H) 07/14/2016   CRP 20.4 (H) 11/29/2015   REPTSTATUS 11/29/2018 FINAL 11/24/2018   GRAMSTAIN  01/12/2017    RARE WBC PRESENT, PREDOMINANTLY MONONUCLEAR FEW GRAM POSITIVE COCCI IN PAIRS    CULT  11/24/2018    NO GROWTH 5 DAYS Performed at [facility], [place] [address], [place], [place]  [address]    LABORGA STAPHYLOCOCCUS AUREUS 01/12/2017     Lab Results  Component Value Date   ALBUMIN 2.8 (L) 01/01/2019   ALBUMIN 3.0 (L) 12/29/2018   ALBUMIN 2.8 (L) 11/26/2018   PREALBUMIN 13.3 (L) 12/30/2018    Lab Results  Component Value Date   MG 2.1 08/02/2018   MG 2.2 08/01/2018   MG 1.9 01/10/2012   No results found for: [facility]  Lab Results  Component Value Date   PREALBUMIN 13.3 (L) 12/30/2018   CBC EXTENDED Latest Ref Rng & Units 01/18/2019 01/02/2019 01/01/2019  WBC 4.0 - 10.5 K/uL 7.5 9.0 8.5  RBC 4.22 - 5.81 MIL/uL 3.27(L) 3.38(L) 3.21(L)  HGB 13.0 - 17.0 g/dL 9.6(L) 10.1(L) 9.5(L)  HCT 39.0 - 52.0 % 31.4(L) 31.9(L) 29.8(L)  PLT 150 - 400 K/uL 152 187 196  NEUTROABS 1.7 - 7.7 K/uL 5.9 - -  LYMPHSABS 0.7 - 4.0 K/uL 0.5(L) - -     Body mass index is 19.25 kg/m.  Orders:  No orders of the defined types were placed in this encounter.  No orders of the defined types were placed in this encounter.    Procedures: No procedures performed  Clinical Data: No additional findings.  ROS:  All other systems negative, except as noted  in the HPI. Review of Systems  Objective: Vital Signs: Ht 5\' 11"  (1.803 m)   Wt 138 lb (62.6 kg)   BMI 19.25 kg/m   Specialty Comments:  No specialty comments available.  PMFS History: Patient Active Problem List   Diagnosis Date Noted  . Below-knee amputation of right lower extremity ([place])   . Abscess of tendon sheath, right ankle and foot   . Diabetic polyneuropathy associated with type 2 diabetes mellitus ([place])   . Moderate protein-calorie malnutrition ([place])   . Cellulitis and abscess of toe of right foot 12/29/2018  . Cellulitis of scrotum 11/27/2018  . Sepsis ([place]) 11/24/2018  . Thrombocytopenia ([place]) 11/24/2018  . Bilateral inguinal hernia 11/21/2018  . Biliary colic 42/87/6811  . Acute cholecystitis 07/31/2018  . Prolonged QT interval 07/31/2018  . Acute respiratory failure with hypoxia ([place]) 07/10/2018    . Abdominal pain 07/10/2018  . Bilateral recurrent inguinal hernia without obstruction or gangrene   . Altered mental status   . Cerebral thrombosis with cerebral infarction 02/06/2017  . Cerebral embolism with cerebral infarction 02/06/2017  . Hx of AKA (above knee amputation), left ([employer])   . Pressure injury of skin 01/30/2017  . Acute lower UTI 01/29/2017  . Acute metabolic encephalopathy 57/26/2035  . UTI (urinary tract infection) 01/29/2017  . Quadriceps muscle rupture, left, initial encounter   . Fall 01/09/2017  . Gait disturbance 01/09/2017  . Staphylococcus aureus infection 01/09/2017  . Infection of prosthetic left knee joint ([place]) 01/09/2017  . Fever, unknown origin 11/09/2016  . Critical lower limb ischemia 08/25/2016  . Ulcer of left midfoot with fat layer exposed ([place]) 08/13/2016  . Diabetic ulcer of left midfoot associated with type 2 diabetes mellitus, with fat layer exposed ([place]) 07/25/2016  . Peripheral neuropathy 07/22/2016  . Tobacco abuse 07/22/2016  . CAD in native artery 05/18/2016  . CAD, multiple vessel 05/11/2016  . Positive cardiac stress test 05/11/2016  . Abnormal stress test 04/30/2016  . Pre-transplant evaluation for kidney transplant 04/30/2016  . S/P revision of total knee 11/26/2015  . Pain in the chest   . Acute on chronic diastolic heart failure ([place]) 07/05/2015  . Volume overload 07/04/2015  . Shortness of breath 07/04/2015  . Hypoxemia 07/04/2015  . Elevated troponin   . End-stage renal disease on hemodialysis ([place])   . Hypervolemia   . 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 Pyogenic bacterial arthritis of knee, left ([place]) 08/07/2014  . Tachycardia 07/24/2014  . Acute upper respiratory infection 07/24/2014  . ESRD on dialysis ([place]) 07/14/2014  . Type II diabetes mellitus ([place]) 07/14/2014  . Anemia in chronic kidney disease 07/14/2014  . Congestive heart failure (CHF) ([place]) 07/13/2014  . Surgical wound dehiscence  05/09/2014  . Dehiscence of closure of skin 05/09/2014  . Total knee replacement status 04/10/2014  . Diabetes mellitus with renal manifestations, controlled ([place]) 10/24/2013  . Hypertensive renal disease 06/27/2013  . DM type 2 causing vascular disease ([place]) 06/27/2013  . Erectile dysfunction 06/27/2013  . Depression 06/27/2013  . Claudication of left lower extremity ([place]) 12/19/2012  . Essential hypertension, benign 12/19/2012  . Sinusitis, acute maxillary 11/22/2012  . Otitis, externa, infective 11/14/2012  . Leg edema, left 11/14/2012  . End stage renal disease ([place]) 10/02/2012  . Controlled type 2 DM with proteinuria or microalbuminuria 09/19/2012  . GERD (gastroesophageal reflux disease) 09/19/2012  . Leukocytosis 09/19/2012  . Lacunar infarction ([place]) 08/17/2012  . Polymyalgia rheumatica ([place]) 08/17/2012  . 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 Essential hypertension 05/10/2012  . Vitamin D deficiency 05/10/2012  . Diabetes mellitus due to underlying condition ([place]) 05/10/2012  . Hyperlipidemia LDL goal <100 05/10/2012  . Anemia of chronic disease 05/10/2012  . Screening for prostate cancer 05/10/2012  . Chronic kidney disease (CKD), stage IV (severe) ([place]) 05/10/2012  . Peripheral autonomic neuropathy due to DM ([place]) 05/10/2012  . Callus of foot 05/10/2012  . Urgency of urination 05/10/2012  . Hyperkalemia 05/10/2012  . Candidiasis of the esophagus 10/12/2011  . Internal hemorrhoids without mention of complication 39/03/90  . Pre-syncope 07/25/2009  . DJD (degenerative joint disease) of cervical spine 02/17/2009   Past Medical History:  Diagnosis Date  . Anemia, unspecified   . Anxiety   . Arthralgia 2010   polyarticular  . Arthritis    "back, knees" (01/10/2017)  . Cancer [facility])    "kidney area" (01/10/2017)  . CHF (congestive heart failure) ([place]) 07/25/2009   denies  . Chronic lower back pain   . Coronary artery disease   . Coughing    pt. reports that he  has drainage from sinus infection  . Diabetic foot ulcer ([place])   . Diabetic neuropathy ([place])   . ESRD (end stage renal disease) on dialysis [facility])    started 12/2012; "MWF; [facility] "  (01/10/2017)  . GERD (gastroesophageal reflux disease)    hx "before I lost weight", no problem 9 years  . Hemodialysis access site with mature fistula ([place])   . Hemorrhoids, internal 10/2011   small  . High cholesterol   . History of blood transfusion    "related to the anemia"  . Hypertension   . Insomnia, unspecified   . Lacunar infarction ([place]) 2006   RUE/RLE, speech  . Long term (current) use of anticoagulants   . Myocardial infarction ([place]) 1995  . Orthostatic hypotension   . Osteomyelitis of foot, left, acute ([place])   . Other chronic postoperative pain   . Pneumonia    "probably twice" (01/10/2017)  . Polymyalgia rheumatica ([place])   . Renal insufficiency   . Sleep apnea    "lost weight; no more problem" (01/10/2017)  . Stroke ([place]) 01/10/06   denies residual on 05/09/2014  . Type II diabetes mellitus ([place]) dx'd 1995  . Unspecified hereditary and idiopathic peripheral neuropathy    feet  . Unspecified osteomyelitis, site unspecified   . Unspecified vitamin D deficiency     Family History  Problem Relation Age of Onset  . Hypertension Mother   . Cancer Mother 37       Ovarian  . Heart disease Maternal Aunt   . Stroke Maternal Grandfather     Past Surgical History:  Procedure Laterality Date  . ABDOMINAL AORTOGRAM N/A 08/25/2016   Procedure: ABDOMINAL AORTOGRAM;  Surgeon: Wellington Hampshire, MD;  Location: [facility];  Service: Cardiovascular;  Laterality: N/A;  . AMPUTATION  01/21/2012   Procedure: AMPUTATION RAY;  Surgeon: [name], MD;  Location: [facility];  Service: Orthopedics;  Laterality: Left;  Left Foot 4th Ray Amputation  . AMPUTATION Left 05/04/2013   Procedure: AMPUTATION DIGIT;  Surgeon: [name], MD;  Location: [facility];  Service: Orthopedics;  Laterality:  Left;  Left Great Toe Amputation at MTP  . AMPUTATION Left 01/14/2017   Procedure: AMPUTATION ABOVE LEFT KNEE;  Surgeon: [name], MD;  Location: [facility];  Service: Orthopedics;  Laterality: Left;  . AMPUTATION Right 12/31/2018   Procedure: AMPUTATION BELOW  KNEE;  Surgeon: [name], MD;  Location: [facility];  Service: Orthopedics;  Laterality: Right;  . ANTERIOR CERVICAL DECOMP/DISCECTOMY FUSION  02/2011  . APPLICATION OF WOUND VAC Right 12/31/2018   Procedure: Application Of Wound Vac;  Surgeon: [name], MD;  Location: [facility];  Service: Orthopedics;  Laterality: Right;  . BACK SURGERY    . BASCILIC VEIN TRANSPOSITION Left 10/19/2012   Procedure: BASCILIC VEIN TRANSPOSITION;  Surgeon: Serafina Mitchell, MD;  Location: [facility];  Service: Vascular;  Laterality: Left;  . CARDIAC CATHETERIZATION     "before bypass"  . CHOLECYSTECTOMY N/A 08/02/2018   Procedure: LAPAROSCOPIC CHOLECYSTECTOMY WITH INTRAOPERATIVE CHOLANGIOGRAM;  Surgeon: Donnie Mesa, MD;  Location: [facility];  Service: General;  Laterality: N/A;  . CORONARY ARTERY BYPASS GRAFT     x 5 with lima at [place] SPACERS Left 08/07/2014   Procedure: Replace Left Total Knee Arthroplasty,  Place Antibiotic Spacer;  Surgeon: [name], MD;  Location: [facility];  Service: Orthopedics;  Laterality: Left;  . I & D EXTREMITY Left 05/09/2014   Procedure: Irrigation and Debridement Left Knee and Closure of Total Knee Arthroplasty Incision;  Surgeon: [name], MD;  Location: [facility];  Service: Orthopedics;  Laterality: Left;  . I & D KNEE WITH POLY EXCHANGE Left 05/31/2014   Procedure: IRRIGATION AND DEBRIDEMENT LEFT KNEE, PLACE ANTIBIOTIC BEADS,  POLY EXCHANGE;  Surgeon: [name], MD;  Location: [facility];  Service: Orthopedics;  Laterality: Left;  . INGUINAL HERNIA REPAIR Bilateral 11/21/2018   Procedure: BILATERAL INGUINAL HERNIA REPAIR WITH MESH;  Surgeon: Coralie Keens, MD;   Location: [facility];  Service: General;  Laterality: Bilateral;  . IRRIGATION AND DEBRIDEMENT KNEE Left 01/12/2017   Procedure: IRRIGATION AND DEBRIDEMENT LEFT KNEE;  Surgeon: [name], MD;  Location: [facility];  Service: Orthopedics;  Laterality: Left;  . JOINT REPLACEMENT    . KNEE ARTHROSCOPY Left 08-25-2012  . LOWER EXTREMITY ANGIOGRAPHY Left 08/25/2016   Procedure: Lower Extremity Angiography;  Surgeon: Wellington Hampshire, MD;  Location: [facility];  Service: Cardiovascular;  Laterality: Left;  . PERIPHERAL VASCULAR BALLOON ANGIOPLASTY Left 08/25/2016   Procedure: PERIPHERAL VASCULAR BALLOON ANGIOPLASTY;  Surgeon: Wellington Hampshire, MD;  Location: [facility];  Service: Cardiovascular;  Laterality: Left;  lt peroneal and ant tibial arteries cutting balloon  . REFRACTIVE SURGERY Bilateral   . TOE AMPUTATION Bilateral    "I've lost 7 toes over the last 7 years" (05/09/2014)  . TOE SURGERY Left April 2015   Big toe removed on left foot.  . TONSILLECTOMY    . TOTAL KNEE ARTHROPLASTY Left 04/10/2014   Procedure: TOTAL KNEE ARTHROPLASTY;  Surgeon: [name], MD;  Location: [facility];  Service: Orthopedics;  Laterality: Left;  . TOTAL KNEE REVISION Left 10/25/2014   Procedure: LEFT TOTAL KNEE REVISION;  Surgeon: [name], MD;  Location: [facility];  Service: Orthopedics;  Laterality: Left;  . TOTAL KNEE REVISION Left 11/26/2015   Procedure: Removal Left Total Knee Arthroplasty, Hinged Total Knee Arthroplasty;  Surgeon: [name], MD;  Location: [facility];  Service: Orthopedics;  Laterality: Left;  . UVULOPALATOPHARYNGOPLASTY, TONSILLECTOMY AND SEPTOPLASTY  ~ 1989  . WOUND DEBRIDEMENT Left 05/09/2014   Dehiscence Left Total Knee Arthroplasty Incision   Social History   Occupational History  . Not on file  Tobacco Use  . Smoking status: Current Some Day Smoker  Packs/day: 0.12    Years: 32.00    Pack years: 3.84    Types: Cigarettes  . Smokeless tobacco: Never Used  . Tobacco  comment: 1 per week  Substance and Sexual Activity  . Alcohol use: No    Alcohol/week: 0.0 standard drinks  . Drug use: No  . Sexual activity: Not Currently

## 2019-02-07 ENCOUNTER — Telehealth: Payer: Self-pay | Admitting: Orthopedic Surgery

## 2019-02-07 NOTE — Telephone Encounter (Signed)
Lysbeth Galas from [employer] [facility] called.   She was calling to follow up on paperwork that was faxed over January 4th and January 19th on behalf of the patient . They are requesting a hospital bed trapeze attachment, right wheel chair extended leg rest, and platform drop .   Call back nunber: [phone]

## 2019-02-08 NOTE — Telephone Encounter (Signed)
I called and spoke to [facility] about patient's paperwork. She will fax over it today for [place] to fill out. I will also put in for patient's hospital bed, trapeze bar at [employer] through [place].

## 2019-02-12 ENCOUNTER — Telehealth: Payer: Self-pay

## 2019-02-12 NOTE — Telephone Encounter (Signed)
I called patient's home health agent Lysbeth Galas and lvm for return call regarding patient's order. Patient needs hospital bed ordered but we need to know if it is standard or gel overlay mattress. [employer] states that he will have to order his light extended leg rest with the company he has received his wheelchair from so it could be the right accessory to wheelchair. The platform drop needs clarification if he needs a drop arm commode.

## 2019-02-15 ENCOUNTER — Ambulatory Visit: Payer: Medicare HMO | Admitting: Physician Assistant

## 2019-03-01 ENCOUNTER — Ambulatory Visit: Payer: Medicare HMO | Admitting: Physician Assistant

## 2019-03-06 ENCOUNTER — Ambulatory Visit: Payer: Medicare HMO | Admitting: Physician Assistant

## 2019-03-20 ENCOUNTER — Ambulatory Visit (INDEPENDENT_AMBULATORY_CARE_PROVIDER_SITE_OTHER): Payer: Medicare HMO

## 2019-03-20 ENCOUNTER — Encounter: Payer: Self-pay | Admitting: Physician Assistant

## 2019-03-20 ENCOUNTER — Ambulatory Visit (INDEPENDENT_AMBULATORY_CARE_PROVIDER_SITE_OTHER): Payer: Medicare HMO | Admitting: Physician Assistant

## 2019-03-20 ENCOUNTER — Other Ambulatory Visit: Payer: Self-pay

## 2019-03-20 VITALS — Ht 71.0 in | Wt 138.0 lb

## 2019-03-20 DIAGNOSIS — M25552 Pain in left hip: Secondary | ICD-10-CM

## 2019-03-20 DIAGNOSIS — M25559 Pain in unspecified hip: Secondary | ICD-10-CM

## 2019-03-20 NOTE — Progress Notes (Signed)
Post-Op Visit Note   Patient: Matthew Kline           Date of Birth: 13-Oct-1960           MRN: [id number] Visit Date: 03/20/2019 PCP: [facility]  Chief Complaint:  Chief Complaint  Patient presents with  . Right Leg - Routine Post Op    12/31/18 right BKA   . Left Hip - Follow-up    9 weeks s/p nondisplaced pelvic fx     HPI:  HPI The patient is a 59 year old gentleman who presents today for 3 separate issues.  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  States he has not yet had any appointments with [facility] for his prosthesis on the right.  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  He did go to [employer] and attempted to purchase a shrinker for his right below-knee amputation unfortunately when he got home he realized he had purchased compression stockings  He also is being seen today for evaluation of left hip pain.  He states he had another fall he was in his kitchen and leaning forward to get something and unfortunately landed on his left hip he has been having some moderate pain and is concerned for hip fracture as he is 9 weeks out from 9 pelvic fracture from a fall onto his right hip.  Right Hip Exam   Tenderness  The patient is experiencing tenderness in the greater trochanter (Mild).  Range of Motion  The patient has normal right hip ROM.     On examination of the right below-knee amputation this is consolidating well he has some eschar no drainage scattered along his incision there is 1 remaining stable there are 2 areas of absorbable suture material that is exposed.  Some very mild surrounding erythema there is no warmth no odor no sign of  infection suture and staple material debrided and removed.  His left above-knee amputation is well-healed well consolidated.  Visit Diagnoses:  1. Hip pain     Plan: Did provide an order for prosthesis supplies for his left above-knee amputation as well as prosthesis set up for his right below-knee amputation will fax this to [facility] for him.  Reassurance provided for the left hip pain.  He will follow-up in the office in 1 month.  Follow-Up Instructions: No follow-ups on file.   Imaging: No results found.  Orders:  Orders Placed This Encounter  Procedures  . XR HIP UNILAT W OR W/O PELVIS 2-3 VIEWS LEFT   No orders of the defined types were placed in this encounter.    PMFS History: Patient Active Problem List   Diagnosis Date Noted  . Below-knee amputation of right lower extremity ([place])   . Abscess of tendon sheath, right ankle and foot   . Diabetic polyneuropathy associated with type 2 diabetes mellitus ([place])   . Moderate protein-calorie malnutrition ([place])   . Cellulitis and abscess of toe of right foot 12/29/2018  . Cellulitis of scrotum 11/27/2018  . Sepsis ([place]) 11/24/2018  . Thrombocytopenia ([place]) 11/24/2018  . Bilateral inguinal hernia 11/21/2018  . Biliary colic 08/67/6195  . Acute cholecystitis 07/31/2018  .  Prolonged QT interval 07/31/2018  . Acute respiratory failure with hypoxia ([place]) 07/10/2018  . Abdominal pain 07/10/2018  . Bilateral recurrent inguinal hernia without obstruction or gangrene   . Altered mental status   . Cerebral thrombosis with cerebral infarction 02/06/2017  . Cerebral embolism with cerebral infarction 02/06/2017  . Hx of AKA (above knee amputation), left ([place])   . Pressure injury of skin 01/30/2017  . Acute lower UTI 01/29/2017  . Acute metabolic encephalopathy 23/53/6144  . UTI (urinary tract infection) 01/29/2017  . Quadriceps muscle rupture, left, initial encounter   . Fall 01/09/2017  . Gait disturbance 01/09/2017  .  Staphylococcus aureus infection 01/09/2017  . Infection of prosthetic left knee joint ([place]) 01/09/2017  . Fever, unknown origin 11/09/2016  . Critical lower limb ischemia 08/25/2016  . Ulcer of left midfoot with fat layer exposed ([place]) 08/13/2016  . Diabetic ulcer of left midfoot associated with type 2 diabetes mellitus, with fat layer exposed ([place]) 07/25/2016  . Peripheral neuropathy 07/22/2016  . Tobacco abuse 07/22/2016  . CAD in native artery 05/18/2016  . CAD, multiple vessel 05/11/2016  . Positive cardiac stress test 05/11/2016  . Abnormal stress test 04/30/2016  . Pre-transplant evaluation for kidney transplant 04/30/2016  . S/P revision of total knee 11/26/2015  . Pain in the chest   . Acute on chronic diastolic heart failure ([place]) 07/05/2015  . Volume overload 07/04/2015  . Shortness of breath 07/04/2015  . Hypoxemia 07/04/2015  . Elevated troponin   . End-stage renal disease on hemodialysis ([place])   . Hypervolemia   . 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 Pyogenic bacterial arthritis of knee, left ([place]) 08/07/2014  . Tachycardia 07/24/2014  . Acute upper respiratory infection 07/24/2014  . ESRD on dialysis ([place]) 07/14/2014  . Type II diabetes mellitus ([place]) 07/14/2014  . Anemia in chronic kidney disease 07/14/2014  . Congestive heart failure (CHF) ([place]) 07/13/2014  . Surgical wound dehiscence 05/09/2014  . Dehiscence of closure of skin 05/09/2014  . Total knee replacement status 04/10/2014  . Diabetes mellitus with renal manifestations, controlled ([place]) 10/24/2013  . Hypertensive renal disease 06/27/2013  . DM type 2 causing vascular disease ([place]) 06/27/2013  . Erectile dysfunction 06/27/2013  . Depression 06/27/2013  . Claudication of left lower extremity ([place]) 12/19/2012  . Essential hypertension, benign 12/19/2012  . Sinusitis, acute maxillary 11/22/2012  . Otitis, externa, infective 11/14/2012  . Leg edema, left 11/14/2012  . End stage renal  disease ([place]) 10/02/2012  . Controlled type 2 DM with proteinuria or microalbuminuria 09/19/2012  . GERD (gastroesophageal reflux disease) 09/19/2012  . Leukocytosis 09/19/2012  . Lacunar infarction ([place]) 08/17/2012  . Polymyalgia rheumatica ([place]) 08/17/2012  . 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 Essential hypertension 05/10/2012  . Vitamin D deficiency 05/10/2012  . Diabetes mellitus due to underlying condition ([place]) 05/10/2012  . Hyperlipidemia LDL goal <100 05/10/2012  . Anemia of chronic disease 05/10/2012  . Screening for prostate cancer 05/10/2012  . Chronic kidney disease (CKD), stage IV (severe) ([place]) 05/10/2012  . Peripheral autonomic neuropathy due to DM ([place]) 05/10/2012  . Callus of foot 05/10/2012  . Urgency of urination 05/10/2012  . Hyperkalemia 05/10/2012  . Candidiasis of the esophagus 10/12/2011  . Internal hemorrhoids without mention of complication 31/54/0086  . Pre-syncope 07/25/2009  . DJD (degenerative joint disease) of cervical spine 02/17/2009   Past Medical History:  Diagnosis Date  . Anemia, unspecified   . Anxiety   . Arthralgia 2010   polyarticular  .  Arthritis    "back, knees" (01/10/2017)  . Cancer [facility]    "kidney area" (01/10/2017)  . CHF (congestive heart failure) ([place]) 07/25/2009   denies  . Chronic lower back pain   . Coronary artery disease   . Coughing    pt. reports that he has drainage from sinus infection  . Diabetic foot ulcer ([place])   . Diabetic neuropathy ([place])   . ESRD (end stage renal disease) on dialysis [facility])    started 12/2012; "MWF; [facility] "  (01/10/2017)  . GERD (gastroesophageal reflux disease)    hx "before I lost weight", no problem 9 years  . Hemodialysis access site with mature fistula ([place])   . Hemorrhoids, internal 10/2011   small  . High cholesterol   . History of blood transfusion    "related to the anemia"  . Hypertension   . Insomnia, unspecified   . Lacunar infarction ([place]) 2006   RUE/RLE,  speech  . Long term (current) use of anticoagulants   . Myocardial infarction ([place]) 1995  . Orthostatic hypotension   . Osteomyelitis of foot, left, acute ([place])   . Other chronic postoperative pain   . Pneumonia    "probably twice" (01/10/2017)  . Polymyalgia rheumatica ([place])   . Renal insufficiency   . Sleep apnea    "lost weight; no more problem" (01/10/2017)  . Stroke ([place]) 01/10/06   denies residual on 05/09/2014  . Type II diabetes mellitus ([place]) dx'd 1995  . Unspecified hereditary and idiopathic peripheral neuropathy    feet  . Unspecified osteomyelitis, site unspecified   . Unspecified vitamin D deficiency     Family History  Problem Relation Age of Onset  . Hypertension Mother   . Cancer Mother 41       Ovarian  . Heart disease Maternal Aunt   . Stroke Maternal Grandfather     Past Surgical History:  Procedure Laterality Date  . ABDOMINAL AORTOGRAM N/A 08/25/2016   Procedure: ABDOMINAL AORTOGRAM;  Surgeon: Wellington Hampshire, MD;  Location: [facility];  Service: Cardiovascular;  Laterality: N/A;  . AMPUTATION  01/21/2012   Procedure: AMPUTATION RAY;  Surgeon: [name], MD;  Location: [facility];  Service: Orthopedics;  Laterality: Left;  Left Foot 4th Ray Amputation  . AMPUTATION Left 05/04/2013   Procedure: AMPUTATION DIGIT;  Surgeon: [name], MD;  Location: [facility];  Service: Orthopedics;  Laterality: Left;  Left Great Toe Amputation at [facility]  . AMPUTATION Left 01/14/2017   Procedure: AMPUTATION ABOVE LEFT KNEE;  Surgeon: [name], MD;  Location: [facility];  Service: Orthopedics;  Laterality: Left;  . AMPUTATION Right 12/31/2018   Procedure: AMPUTATION BELOW KNEE;  Surgeon: [name], MD;  Location: [facility];  Service: Orthopedics;  Laterality: Right;  . ANTERIOR CERVICAL DECOMP/DISCECTOMY FUSION  02/2011  . APPLICATION OF WOUND VAC Right 12/31/2018   Procedure: Application Of Wound Vac;  Surgeon: [name], MD;  Location: [facility];  Service: Orthopedics;   Laterality: Right;  . BACK SURGERY    . BASCILIC VEIN TRANSPOSITION Left 10/19/2012   Procedure: BASCILIC VEIN TRANSPOSITION;  Surgeon: Serafina Mitchell, MD;  Location: [facility];  Service: Vascular;  Laterality: Left;  . CARDIAC CATHETERIZATION     "before bypass"  . CHOLECYSTECTOMY N/A 08/02/2018   Procedure: LAPAROSCOPIC CHOLECYSTECTOMY WITH INTRAOPERATIVE CHOLANGIOGRAM;  Surgeon: Donnie Mesa, MD;  Location: [facility];  Service: General;  Laterality: N/A;  . CORONARY ARTERY BYPASS GRAFT  x 5 with lima at [place] WITH ANTIBIOTIC SPACERS Left 08/07/2014   Procedure: Replace Left Total Knee Arthroplasty,  Place Antibiotic Spacer;  Surgeon: [name], MD;  Location: [facility];  Service: Orthopedics;  Laterality: Left;  . I & D EXTREMITY Left 05/09/2014   Procedure: Irrigation and Debridement Left Knee and Closure of Total Knee Arthroplasty Incision;  Surgeon: [name], MD;  Location: [facility];  Service: Orthopedics;  Laterality: Left;  . I & D KNEE WITH POLY EXCHANGE Left 05/31/2014   Procedure: IRRIGATION AND DEBRIDEMENT LEFT KNEE, PLACE ANTIBIOTIC BEADS,  POLY EXCHANGE;  Surgeon: [name], MD;  Location: [facility];  Service: Orthopedics;  Laterality: Left;  . INGUINAL HERNIA REPAIR Bilateral 11/21/2018   Procedure: BILATERAL INGUINAL HERNIA REPAIR WITH MESH;  Surgeon: Coralie Keens, MD;  Location: [facility];  Service: General;  Laterality: Bilateral;  . IRRIGATION AND DEBRIDEMENT KNEE Left 01/12/2017   Procedure: IRRIGATION AND DEBRIDEMENT LEFT KNEE;  Surgeon: [name], MD;  Location: [facility];  Service: Orthopedics;  Laterality: Left;  . JOINT REPLACEMENT    . KNEE ARTHROSCOPY Left 08-25-2012  . LOWER EXTREMITY ANGIOGRAPHY Left 08/25/2016   Procedure: Lower Extremity Angiography;  Surgeon: Wellington Hampshire, MD;  Location: [facility];  Service: Cardiovascular;  Laterality: Left;  . PERIPHERAL VASCULAR BALLOON ANGIOPLASTY Left 08/25/2016    Procedure: PERIPHERAL VASCULAR BALLOON ANGIOPLASTY;  Surgeon: Wellington Hampshire, MD;  Location: [facility];  Service: Cardiovascular;  Laterality: Left;  lt peroneal and ant tibial arteries cutting balloon  . REFRACTIVE SURGERY Bilateral   . TOE AMPUTATION Bilateral    "I've lost 7 toes over the last 7 years" (05/09/2014)  . TOE SURGERY Left April 2015   Big toe removed on left foot.  . TONSILLECTOMY    . TOTAL KNEE ARTHROPLASTY Left 04/10/2014   Procedure: TOTAL KNEE ARTHROPLASTY;  Surgeon: [name], MD;  Location: [facility];  Service: Orthopedics;  Laterality: Left;  . TOTAL KNEE REVISION Left 10/25/2014   Procedure: LEFT TOTAL KNEE REVISION;  Surgeon: [name], MD;  Location: [facility];  Service: Orthopedics;  Laterality: Left;  . TOTAL KNEE REVISION Left 11/26/2015   Procedure: Removal Left Total Knee Arthroplasty, Hinged Total Knee Arthroplasty;  Surgeon: [name], MD;  Location: [facility];  Service: Orthopedics;  Laterality: Left;  . UVULOPALATOPHARYNGOPLASTY, TONSILLECTOMY AND SEPTOPLASTY  ~ 1989  . WOUND DEBRIDEMENT Left 05/09/2014   Dehiscence Left Total Knee Arthroplasty Incision   Social History   Occupational History  . Not on file  Tobacco Use  . Smoking status: Current Some Day Smoker    Packs/day: 0.12    Years: 32.00    Pack years: 3.84    Types: Cigarettes  . Smokeless tobacco: Never Used  . Tobacco comment: 1 per week  Substance and Sexual Activity  . Alcohol use: No    Alcohol/week: 0.0 standard drinks  . Drug use: No  . Sexual activity: Not Currently

## 2019-04-17 ENCOUNTER — Ambulatory Visit: Payer: Medicare HMO | Admitting: Family

## 2019-05-21 ENCOUNTER — Inpatient Hospital Stay (HOSPITAL_COMMUNITY)
Admission: EM | Admit: 2019-05-21 | Discharge: 2019-05-30 | DRG: 474 | Disposition: A | Discharge status: Skilled Nursing Facility | Payer: Medicare HMO | Attending: Internal Medicine | Admitting: Internal Medicine

## 2019-05-21 ENCOUNTER — Other Ambulatory Visit: Payer: Self-pay

## 2019-05-21 ENCOUNTER — Encounter (HOSPITAL_COMMUNITY): Payer: Self-pay | Admitting: Emergency Medicine

## 2019-05-21 DIAGNOSIS — Z9115 Patient's noncompliance with renal dialysis: Secondary | ICD-10-CM

## 2019-05-21 DIAGNOSIS — E43 Unspecified severe protein-calorie malnutrition: Secondary | ICD-10-CM

## 2019-05-21 DIAGNOSIS — L03115 Cellulitis of right lower limb: Secondary | ICD-10-CM

## 2019-05-21 DIAGNOSIS — T8781 Dehiscence of amputation stump: Secondary | ICD-10-CM | POA: Diagnosis present

## 2019-05-21 DIAGNOSIS — R1902 Left upper quadrant abdominal swelling, mass and lump: Secondary | ICD-10-CM | POA: Diagnosis present

## 2019-05-21 DIAGNOSIS — Z681 Body mass index (BMI) 19 or less, adult: Secondary | ICD-10-CM

## 2019-05-21 DIAGNOSIS — N2581 Secondary hyperparathyroidism of renal origin: Secondary | ICD-10-CM | POA: Diagnosis present

## 2019-05-21 DIAGNOSIS — M353 Polymyalgia rheumatica: Secondary | ICD-10-CM | POA: Diagnosis present

## 2019-05-21 DIAGNOSIS — L0390 Cellulitis, unspecified: Secondary | ICD-10-CM | POA: Diagnosis present

## 2019-05-21 DIAGNOSIS — I120 Hypertensive chronic kidney disease with stage 5 chronic kidney disease or end stage renal disease: Secondary | ICD-10-CM | POA: Diagnosis present

## 2019-05-21 DIAGNOSIS — E1122 Type 2 diabetes mellitus with diabetic chronic kidney disease: Secondary | ICD-10-CM | POA: Diagnosis present

## 2019-05-21 DIAGNOSIS — I739 Peripheral vascular disease, unspecified: Secondary | ICD-10-CM

## 2019-05-21 DIAGNOSIS — Y835 Amputation of limb(s) as the cause of abnormal reaction of the patient, or of later complication, without mention of misadventure at the time of the procedure: Secondary | ICD-10-CM | POA: Diagnosis present

## 2019-05-21 DIAGNOSIS — Z951 Presence of aortocoronary bypass graft: Secondary | ICD-10-CM

## 2019-05-21 DIAGNOSIS — E872 Acidosis: Secondary | ICD-10-CM | POA: Diagnosis present

## 2019-05-21 DIAGNOSIS — T8743 Infection of amputation stump, right lower extremity: Secondary | ICD-10-CM | POA: Diagnosis present

## 2019-05-21 DIAGNOSIS — L02415 Cutaneous abscess of right lower limb: Secondary | ICD-10-CM | POA: Diagnosis present

## 2019-05-21 DIAGNOSIS — R410 Disorientation, unspecified: Secondary | ICD-10-CM | POA: Diagnosis not present

## 2019-05-21 DIAGNOSIS — N186 End stage renal disease: Secondary | ICD-10-CM | POA: Diagnosis present

## 2019-05-21 DIAGNOSIS — L02419 Cutaneous abscess of limb, unspecified: Secondary | ICD-10-CM

## 2019-05-21 DIAGNOSIS — L03119 Cellulitis of unspecified part of limb: Secondary | ICD-10-CM

## 2019-05-21 DIAGNOSIS — I70201 Unspecified atherosclerosis of native arteries of extremities, right leg: Secondary | ICD-10-CM | POA: Diagnosis present

## 2019-05-21 DIAGNOSIS — D631 Anemia in chronic kidney disease: Secondary | ICD-10-CM | POA: Diagnosis present

## 2019-05-21 DIAGNOSIS — Z89612 Acquired absence of left leg above knee: Secondary | ICD-10-CM

## 2019-05-21 DIAGNOSIS — E7800 Pure hypercholesterolemia, unspecified: Secondary | ICD-10-CM | POA: Diagnosis present

## 2019-05-21 DIAGNOSIS — B9561 Methicillin susceptible Staphylococcus aureus infection as the cause of diseases classified elsewhere: Secondary | ICD-10-CM | POA: Diagnosis present

## 2019-05-21 DIAGNOSIS — I2510 Atherosclerotic heart disease of native coronary artery without angina pectoris: Secondary | ICD-10-CM | POA: Diagnosis present

## 2019-05-21 DIAGNOSIS — E875 Hyperkalemia: Secondary | ICD-10-CM | POA: Diagnosis present

## 2019-05-21 DIAGNOSIS — Z992 Dependence on renal dialysis: Secondary | ICD-10-CM

## 2019-05-21 DIAGNOSIS — K529 Noninfective gastroenteritis and colitis, unspecified: Secondary | ICD-10-CM | POA: Diagnosis present

## 2019-05-21 DIAGNOSIS — Z87891 Personal history of nicotine dependence: Secondary | ICD-10-CM

## 2019-05-21 DIAGNOSIS — E1151 Type 2 diabetes mellitus with diabetic peripheral angiopathy without gangrene: Secondary | ICD-10-CM | POA: Diagnosis present

## 2019-05-21 DIAGNOSIS — G9340 Encephalopathy, unspecified: Secondary | ICD-10-CM | POA: Diagnosis present

## 2019-05-21 DIAGNOSIS — Z8673 Personal history of transient ischemic attack (TIA), and cerebral infarction without residual deficits: Secondary | ICD-10-CM

## 2019-05-21 DIAGNOSIS — Z8249 Family history of ischemic heart disease and other diseases of the circulatory system: Secondary | ICD-10-CM

## 2019-05-21 DIAGNOSIS — T8130XA Disruption of wound, unspecified, initial encounter: Secondary | ICD-10-CM | POA: Diagnosis present

## 2019-05-21 DIAGNOSIS — E1140 Type 2 diabetes mellitus with diabetic neuropathy, unspecified: Secondary | ICD-10-CM | POA: Diagnosis present

## 2019-05-21 DIAGNOSIS — Z20822 Contact with and (suspected) exposure to covid-19: Secondary | ICD-10-CM | POA: Diagnosis present

## 2019-05-21 DIAGNOSIS — I252 Old myocardial infarction: Secondary | ICD-10-CM

## 2019-05-21 DIAGNOSIS — I96 Gangrene, not elsewhere classified: Secondary | ICD-10-CM | POA: Diagnosis present

## 2019-05-21 DIAGNOSIS — Z7982 Long term (current) use of aspirin: Secondary | ICD-10-CM

## 2019-05-21 DIAGNOSIS — Z885 Allergy status to narcotic agent status: Secondary | ICD-10-CM

## 2019-05-21 DIAGNOSIS — Z888 Allergy status to other drugs, medicaments and biological substances status: Secondary | ICD-10-CM

## 2019-05-21 DIAGNOSIS — Z96652 Presence of left artificial knee joint: Secondary | ICD-10-CM | POA: Diagnosis present

## 2019-05-21 DIAGNOSIS — K8689 Other specified diseases of pancreas: Secondary | ICD-10-CM | POA: Diagnosis present

## 2019-05-21 DIAGNOSIS — Z79899 Other long term (current) drug therapy: Secondary | ICD-10-CM

## 2019-05-21 DIAGNOSIS — Z823 Family history of stroke: Secondary | ICD-10-CM

## 2019-05-21 LAB — CBC WITH DIFFERENTIAL/PLATELET
Abs Immature Granulocytes: 0.06 10*3/uL (ref 0.00–0.07)
Basophils Absolute: 0.1 10*3/uL (ref 0.0–0.1)
Basophils Relative: 1 %
Eosinophils Absolute: 0.1 10*3/uL (ref 0.0–0.5)
Eosinophils Relative: 1 %
HCT: 35.7 % — ABNORMAL LOW (ref 39.0–52.0)
Hemoglobin: 11.2 g/dL — ABNORMAL LOW (ref 13.0–17.0)
Immature Granulocytes: 1 %
Lymphocytes Relative: 8 %
Lymphs Abs: 0.9 10*3/uL (ref 0.7–4.0)
MCH: 29.9 pg (ref 26.0–34.0)
MCHC: 31.4 g/dL (ref 30.0–36.0)
MCV: 95.5 fL (ref 80.0–100.0)
Monocytes Absolute: 0.9 10*3/uL (ref 0.1–1.0)
Monocytes Relative: 8 %
Neutro Abs: 9.8 10*3/uL — ABNORMAL HIGH (ref 1.7–7.7)
Neutrophils Relative %: 81 %
Platelets: 149 10*3/uL — ABNORMAL LOW (ref 150–400)
RBC: 3.74 MIL/uL — ABNORMAL LOW (ref 4.22–5.81)
RDW: 17.6 % — ABNORMAL HIGH (ref 11.5–15.5)
WBC: 11.8 10*3/uL — ABNORMAL HIGH (ref 4.0–10.5)
nRBC: 0.3 % — ABNORMAL HIGH (ref 0.0–0.2)

## 2019-05-21 LAB — COMPREHENSIVE METABOLIC PANEL
ALT: 12 U/L (ref 0–44)
AST: 17 U/L (ref 15–41)
Albumin: 2.5 g/dL — ABNORMAL LOW (ref 3.5–5.0)
Alkaline Phosphatase: 119 U/L (ref 38–126)
Anion gap: 17 — ABNORMAL HIGH (ref 5–15)
BUN: 52 mg/dL — ABNORMAL HIGH (ref 6–20)
CO2: 24 mmol/L (ref 22–32)
Calcium: 9.2 mg/dL (ref 8.9–10.3)
Chloride: 97 mmol/L — ABNORMAL LOW (ref 98–111)
Creatinine, Ser: 8.97 mg/dL — ABNORMAL HIGH (ref 0.61–1.24)
GFR calc Af Amer: 7 mL/min — ABNORMAL LOW (ref >60)
GFR calc non Af Amer: 6 mL/min — ABNORMAL LOW (ref >60)
Glucose, Bld: 174 mg/dL — ABNORMAL HIGH (ref 70–99)
Potassium: 5.4 mmol/L — ABNORMAL HIGH (ref 3.5–5.1)
Sodium: 138 mmol/L (ref 135–145)
Total Bilirubin: 1.5 mg/dL — ABNORMAL HIGH (ref 0.3–1.2)
Total Protein: 7.5 g/dL (ref 6.5–8.1)

## 2019-05-21 LAB — HEMOGLOBIN A1C
Hgb A1c MFr Bld: 5.5 % (ref 4.8–5.6)
Mean Plasma Glucose: 111.15 mg/dL

## 2019-05-21 LAB — SARS CORONAVIRUS 2 BY RT PCR (HOSPITAL ORDER, PERFORMED IN ~~LOC~~ HOSPITAL LAB): SARS Coronavirus 2: NEGATIVE

## 2019-05-21 LAB — GLUCOSE, CAPILLARY: Glucose-Capillary: 118 mg/dL — ABNORMAL HIGH (ref 70–99)

## 2019-05-21 MED ORDER — AMLODIPINE BESYLATE 5 MG PO TABS
5.0000 mg | ORAL_TABLET | Freq: Every evening | ORAL | Status: DC
  Administered 2019-05-22 – 2019-05-30 (×9): 5 mg via ORAL
  Filled 2019-05-21 (×12): qty 1

## 2019-05-21 MED ORDER — CHLORHEXIDINE GLUCONATE CLOTH 2 % EX PADS
6.0000 | MEDICATED_PAD | Freq: Every day | CUTANEOUS | Status: DC
  Administered 2019-05-25 – 2019-05-27 (×3): 6 via TOPICAL

## 2019-05-21 MED ORDER — HYDROMORPHONE HCL 1 MG/ML IJ SOLN
0.5000 mg | INTRAMUSCULAR | Status: DC | PRN
  Administered 2019-05-22 – 2019-05-24 (×2): 1 mg via INTRAVENOUS
  Administered 2019-05-24: 0.5 mg via INTRAVENOUS
  Administered 2019-05-24 – 2019-05-29 (×11): 1 mg via INTRAVENOUS
  Filled 2019-05-21 (×14): qty 1

## 2019-05-21 MED ORDER — ACETAMINOPHEN 650 MG RE SUPP: 650.0000 mg | Freq: Four times a day (QID) | RECTAL | Status: DC | PRN

## 2019-05-21 MED ORDER — FENTANYL CITRATE (PF) 100 MCG/2ML IJ SOLN
50.0000 ug | Freq: Once | INTRAMUSCULAR | Status: AC
  Administered 2019-05-21: 50 ug via INTRAVENOUS
  Filled 2019-05-21: qty 2

## 2019-05-21 MED ORDER — RENA-VITE PO TABS
1.0000 | ORAL_TABLET | Freq: Every day | ORAL | Status: DC
  Administered 2019-05-22 – 2019-05-29 (×8): 1 via ORAL
  Filled 2019-05-21 (×9): qty 1

## 2019-05-21 MED ORDER — VANCOMYCIN HCL IN DEXTROSE 1-5 GM/200ML-% IV SOLN
1000.0000 mg | Freq: Once | INTRAVENOUS | Status: AC
  Administered 2019-05-21: 1000 mg via INTRAVENOUS
  Filled 2019-05-21: qty 200

## 2019-05-21 MED ORDER — DARBEPOETIN ALFA 60 MCG/0.3ML IJ SOSY
60.0000 ug | PREFILLED_SYRINGE | INTRAMUSCULAR | Status: DC
  Filled 2019-05-21: qty 0.3

## 2019-05-21 MED ORDER — SERTRALINE HCL 100 MG PO TABS
100.0000 mg | ORAL_TABLET | Freq: Every day | ORAL | Status: DC
  Administered 2019-05-22 – 2019-05-30 (×8): 100 mg via ORAL
  Filled 2019-05-21 (×11): qty 1

## 2019-05-21 MED ORDER — INSULIN ASPART 100 UNIT/ML ~~LOC~~ SOLN: 0.0000 [IU] | Freq: Every day | SUBCUTANEOUS | Status: DC

## 2019-05-21 MED ORDER — POLYETHYLENE GLYCOL 3350 17 G PO PACK: 17.0000 g | PACK | Freq: Every day | ORAL | Status: DC | PRN

## 2019-05-21 MED ORDER — GABAPENTIN 100 MG PO CAPS
200.0000 mg | ORAL_CAPSULE | Freq: Every day | ORAL | Status: DC
  Administered 2019-05-22 – 2019-05-30 (×8): 200 mg via ORAL
  Filled 2019-05-21 (×10): qty 2

## 2019-05-21 MED ORDER — ACETAMINOPHEN 325 MG PO TABS: 650.0000 mg | ORAL_TABLET | Freq: Four times a day (QID) | ORAL | Status: DC | PRN

## 2019-05-21 MED ORDER — ASPIRIN EC 81 MG PO TBEC
81.0000 mg | DELAYED_RELEASE_TABLET | Freq: Every day | ORAL | Status: DC
  Administered 2019-05-22 – 2019-05-29 (×8): 81 mg via ORAL
  Filled 2019-05-21 (×9): qty 1

## 2019-05-21 MED ORDER — HEPARIN SODIUM (PORCINE) 5000 UNIT/ML IJ SOLN
5000.0000 [IU] | Freq: Three times a day (TID) | INTRAMUSCULAR | Status: DC
  Administered 2019-05-22 – 2019-05-30 (×21): 5000 [IU] via SUBCUTANEOUS
  Filled 2019-05-21 (×21): qty 1

## 2019-05-21 MED ORDER — MONTELUKAST SODIUM 10 MG PO TABS
10.0000 mg | ORAL_TABLET | Freq: Every evening | ORAL | Status: DC | PRN
  Filled 2019-05-21 (×2): qty 1

## 2019-05-21 MED ORDER — ZINC OXIDE 40 % EX OINT
1.0000 "application " | TOPICAL_OINTMENT | Freq: Two times a day (BID) | CUTANEOUS | Status: DC | PRN
  Administered 2019-05-22 – 2019-05-24 (×3): 1 via TOPICAL
  Filled 2019-05-21 (×3): qty 57

## 2019-05-21 MED ORDER — TEMAZEPAM 30 MG PO CAPS
30.0000 mg | ORAL_CAPSULE | Freq: Every day | ORAL | Status: DC
  Administered 2019-05-22: 30 mg via ORAL
  Filled 2019-05-21 (×2): qty 2

## 2019-05-21 MED ORDER — VANCOMYCIN HCL IN DEXTROSE 750-5 MG/150ML-% IV SOLN
750.0000 mg | INTRAVENOUS | Status: AC
  Filled 2019-05-21 (×2): qty 150

## 2019-05-21 MED ORDER — FENTANYL CITRATE (PF) 100 MCG/2ML IJ SOLN
25.0000 ug | Freq: Once | INTRAMUSCULAR | Status: AC
  Administered 2019-05-21: 25 ug via INTRAVENOUS
  Filled 2019-05-21: qty 2

## 2019-05-21 MED ORDER — HYDROXYZINE HCL 25 MG PO TABS
50.0000 mg | ORAL_TABLET | Freq: Three times a day (TID) | ORAL | Status: DC | PRN
  Administered 2019-05-21 – 2019-05-29 (×2): 50 mg via ORAL
  Filled 2019-05-21 (×3): qty 2

## 2019-05-21 MED ORDER — PROMETHAZINE HCL 25 MG PO TABS: 12.5000 mg | ORAL_TABLET | Freq: Four times a day (QID) | ORAL | Status: DC | PRN

## 2019-05-21 MED ORDER — INSULIN ASPART 100 UNIT/ML ~~LOC~~ SOLN: 0.0000 [IU] | Freq: Three times a day (TID) | SUBCUTANEOUS | Status: DC

## 2019-05-21 MED ORDER — METHOCARBAMOL 500 MG PO TABS
500.0000 mg | ORAL_TABLET | Freq: Four times a day (QID) | ORAL | Status: DC | PRN
  Administered 2019-05-22: 500 mg via ORAL
  Filled 2019-05-21 (×2): qty 1

## 2019-05-21 MED ORDER — VANCOMYCIN HCL IN DEXTROSE 1-5 GM/200ML-% IV SOLN
INTRAVENOUS | Status: AC
  Filled 2019-05-21: qty 200

## 2019-05-21 MED ORDER — CARVEDILOL 12.5 MG PO TABS
12.5000 mg | ORAL_TABLET | Freq: Two times a day (BID) | ORAL | Status: DC
  Administered 2019-05-22 – 2019-05-30 (×16): 12.5 mg via ORAL
  Filled 2019-05-21 (×19): qty 1

## 2019-05-21 MED ORDER — SODIUM CHLORIDE 0.9 % IV SOLN
2.0000 g | Freq: Once | INTRAVENOUS | Status: AC
  Administered 2019-05-21: 2 g via INTRAVENOUS
  Filled 2019-05-21: qty 20

## 2019-05-21 MED ORDER — HYDROMORPHONE HCL 1 MG/ML IJ SOLN
INTRAMUSCULAR | Status: AC
  Administered 2019-05-21: 1 mg
  Filled 2019-05-21: qty 1

## 2019-05-21 MED ORDER — SEVELAMER CARBONATE 800 MG PO TABS
2400.0000 mg | ORAL_TABLET | Freq: Three times a day (TID) | ORAL | Status: DC
  Administered 2019-05-22 – 2019-05-30 (×17): 2400 mg via ORAL
  Filled 2019-05-21 (×21): qty 3

## 2019-05-21 MED ORDER — PANCRELIPASE (LIP-PROT-AMYL) 36000-114000 UNITS PO CPEP
72000.0000 [IU] | ORAL_CAPSULE | Freq: Three times a day (TID) | ORAL | Status: DC
  Administered 2019-05-22 – 2019-05-30 (×15): 72000 [IU] via ORAL
  Filled 2019-05-21 (×13): qty 2

## 2019-05-21 MED ORDER — PRO-STAT SUGAR FREE PO LIQD
30.0000 mL | Freq: Two times a day (BID) | ORAL | Status: DC
  Administered 2019-05-22 – 2019-05-30 (×11): 30 mL via ORAL
  Filled 2019-05-21 (×17): qty 30

## 2019-05-21 MED ORDER — PANCRELIPASE (LIP-PROT-AMYL) 36000-114000 UNITS PO CPEP
36000.0000 [IU] | ORAL_CAPSULE | ORAL | Status: DC | PRN
  Filled 2019-05-21 (×6): qty 1

## 2019-05-21 NOTE — ED Triage Notes (Signed)
Last dialysis was Wednesday per pt. States has not eaten for "awhile" approx 10 days.

## 2019-05-21 NOTE — ED Notes (Signed)
C/o itching- hydroxyzine given

## 2019-05-21 NOTE — ED Triage Notes (Signed)
Pt BIB [facility] from home, missed dialysis x 1 week, c/o pain "all over", diarrhea x 1 month, and aching L side pain. Pt is bilat leg amputee, w/ Right side swollen, red, drainage noted. Pt A&Ox4, VS stable, NAD noted.

## 2019-05-21 NOTE — Consult Note (Signed)
[facility] Renal Consultation Note    Indication for Consultation:  Management of ESRD/hemodialysis, anemia, hypertension/volume, and secondary hyperparathyroidism.  HPI: Matthew Kline is a 59 y.o. 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  Patient also reported frequent diarrhea and rectal pain, and a lump in his left upper quadrant.  Stump wound was cultured and blood cultures sent, patient has been admitted for management of cellulitis.  On presentation, K5.4, creatinine 8.97, BUN 52, albumin 2.5, hemoglobin 11.2, white count 11.8.  BP 140/81, afebrile.   Records reveal patient last dialyzed on 05/14/2019, however he reports he only missed 1 dialysis treatment.  Reports missing dialysis due to "not feeling well."  He does tend to miss dialysis frequently. At the time of my exam, patient has recently received fentanyl and is a bit sedated which limits history (pt reports he is sensitive to pain meds).  He only complains of leg pain and denies any shortness of breath, orthopnea, chest pain, palpitations, dizziness, abdominal pain, nausea, vomiting, diarrhea.  Denies blood in the stool or black tarry stools.  Denies recent fever, chills, or decreased appetite.  Past Medical History:  Diagnosis Date  . Anemia, unspecified   . Anxiety   . Arthralgia 2010   polyarticular  . Arthritis    "back, knees" (01/10/2017)  . Cancer [facility])    "kidney area" (01/10/2017)  . CHF (congestive heart failure) ([place]) 07/25/2009   denies  . Chronic lower back pain   . Coronary artery disease   . Coughing    pt. reports that he has drainage from sinus infection  . Diabetic foot ulcer ([place])   . Diabetic neuropathy ([place])   . ESRD (end stage renal disease) on dialysis [facility])    started  12/2012; "MWF; [place] "  (01/10/2017)  . GERD (gastroesophageal reflux disease)    hx "before I lost weight", no problem 9 years  . Hemodialysis access site with mature fistula ([place])   . Hemorrhoids, internal 10/2011   small  . High cholesterol   . History of blood transfusion    "related to the anemia"  . Hypertension   . Insomnia, unspecified   . Lacunar infarction ([place]) 2006   RUE/RLE, speech  . Long term (current) use of anticoagulants   . Myocardial infarction ([place]) 1995  . Orthostatic hypotension   . Osteomyelitis of foot, left, acute ([place])   . Other chronic postoperative pain   . Pneumonia    "probably twice" (01/10/2017)  . Polymyalgia rheumatica ([place])   . Renal insufficiency   . Sleep apnea    "lost weight; no more problem" (01/10/2017)  . Stroke ([place]) 01/10/06   denies residual on 05/09/2014  . Type II diabetes mellitus ([place]) dx'd 1995  . Unspecified hereditary and idiopathic peripheral neuropathy    feet  . Unspecified osteomyelitis, site unspecified   . Unspecified vitamin D deficiency    Past Surgical History:  Procedure Laterality Date  . ABDOMINAL AORTOGRAM N/A 08/25/2016   Procedure: ABDOMINAL AORTOGRAM;  Surgeon: Wellington Hampshire, MD;  Location: [place] CV [facility];  Service: Cardiovascular;  Laterality: N/A;  . AMPUTATION  01/21/2012   Procedure: AMPUTATION RAY;  Surgeon: [name], MD;  Location: [place];  Service: Orthopedics;  Laterality: Left;  Left Foot 4th Ray Amputation  .  AMPUTATION Left 05/04/2013   Procedure: AMPUTATION DIGIT;  Surgeon: [name], MD;  Location: [place];  Service: Orthopedics;  Laterality: Left;  Left Great Toe Amputation at MTP  . AMPUTATION Left 01/14/2017   Procedure: AMPUTATION ABOVE LEFT KNEE;  Surgeon: [name], MD;  Location: [place];  Service: Orthopedics;  Laterality: Left;  . AMPUTATION Right 12/31/2018   Procedure: AMPUTATION BELOW KNEE;  Surgeon: [name], MD;  Location: [place];  Service: Orthopedics;   Laterality: Right;  . ANTERIOR CERVICAL DECOMP/DISCECTOMY FUSION  02/2011  . APPLICATION OF WOUND VAC Right 12/31/2018   Procedure: Application Of Wound Vac;  Surgeon: [name], MD;  Location: [place];  Service: Orthopedics;  Laterality: Right;  . BACK SURGERY    . BASCILIC VEIN TRANSPOSITION Left 10/19/2012   Procedure: BASCILIC VEIN TRANSPOSITION;  Surgeon: Serafina Mitchell, MD;  Location: [place];  Service: Vascular;  Laterality: Left;  . CARDIAC CATHETERIZATION     "before bypass"  . CHOLECYSTECTOMY N/A 08/02/2018   Procedure: LAPAROSCOPIC CHOLECYSTECTOMY WITH INTRAOPERATIVE CHOLANGIOGRAM;  Surgeon: Donnie Mesa, MD;  Location: [place];  Service: General;  Laterality: N/A;  . CORONARY ARTERY BYPASS GRAFT     x 5 with lima at [place] SPACERS Left 08/07/2014   Procedure: Replace Left Total Knee Arthroplasty,  Place Antibiotic Spacer;  Surgeon: [name], MD;  Location: [place];  Service: Orthopedics;  Laterality: Left;  . I & D EXTREMITY Left 05/09/2014   Procedure: Irrigation and Debridement Left Knee and Closure of Total Knee Arthroplasty Incision;  Surgeon: [name], MD;  Location: [place];  Service: Orthopedics;  Laterality: Left;  . I & D KNEE WITH POLY EXCHANGE Left 05/31/2014   Procedure: IRRIGATION AND DEBRIDEMENT LEFT KNEE, PLACE ANTIBIOTIC BEADS,  POLY EXCHANGE;  Surgeon: [name], MD;  Location: [place];  Service: Orthopedics;  Laterality: Left;  . INGUINAL HERNIA REPAIR Bilateral 11/21/2018   Procedure: BILATERAL INGUINAL HERNIA REPAIR WITH MESH;  Surgeon: Coralie Keens, MD;  Location: [place];  Service: General;  Laterality: Bilateral;  . IRRIGATION AND DEBRIDEMENT KNEE Left 01/12/2017   Procedure: IRRIGATION AND DEBRIDEMENT LEFT KNEE;  Surgeon: [name], MD;  Location: [place];  Service: Orthopedics;  Laterality: Left;  . JOINT REPLACEMENT    . KNEE ARTHROSCOPY Left 08-25-2012  . LOWER EXTREMITY ANGIOGRAPHY  Left 08/25/2016   Procedure: Lower Extremity Angiography;  Surgeon: Wellington Hampshire, MD;  Location: [place] [facility];  Service: Cardiovascular;  Laterality: Left;  . PERIPHERAL VASCULAR BALLOON ANGIOPLASTY Left 08/25/2016   Procedure: PERIPHERAL VASCULAR BALLOON ANGIOPLASTY;  Surgeon: Wellington Hampshire, MD;  Location: [place] [facility];  Service: Cardiovascular;  Laterality: Left;  lt peroneal and ant tibial arteries cutting balloon  . REFRACTIVE SURGERY Bilateral   . TOE AMPUTATION Bilateral    "I've lost 7 toes over the last 7 years" (05/09/2014)  . TOE SURGERY Left April 2015   Big toe removed on left foot.  . TONSILLECTOMY    . TOTAL KNEE ARTHROPLASTY Left 04/10/2014   Procedure: TOTAL KNEE ARTHROPLASTY;  Surgeon: [name], MD;  Location: [place];  Service: Orthopedics;  Laterality: Left;  . TOTAL KNEE REVISION Left 10/25/2014   Procedure: LEFT TOTAL KNEE REVISION;  Surgeon: [name], MD;  Location: [place];  Service: Orthopedics;  Laterality: Left;  . TOTAL KNEE REVISION Left 11/26/2015   Procedure: Removal Left Total  Knee Arthroplasty, Hinged Total Knee Arthroplasty;  Surgeon: [name], MD;  Location: [place];  Service: Orthopedics;  Laterality: Left;  . UVULOPALATOPHARYNGOPLASTY, TONSILLECTOMY AND SEPTOPLASTY  ~ 1989  . WOUND DEBRIDEMENT Left 05/09/2014   Dehiscence Left Total Knee Arthroplasty Incision   Family History  Problem Relation Age of Onset  . Hypertension Mother   . Cancer Mother 41       Ovarian  . Heart disease Maternal Aunt   . Stroke Maternal Grandfather    Social History:  reports that he has been smoking cigarettes. He has a 3.84 pack-year smoking history. He has never used smokeless tobacco. He reports that he does not drink alcohol or use drugs.  ROS: As per HPI otherwise negative.  Physical Exam: Vitals:   05/21/19 0800 05/21/19 0815 05/21/19 0830 05/21/19 0845  BP: 118/80 121/78 129/79 137/81  Pulse: 75 73 73 72  Resp: 12 16 13 12   Temp:       TempSrc:      SpO2: 92% 90% 95% 91%  Weight:         General: Well developed, chronically ill appearing male,  in no acute distress. Head: Normocephalic, atraumatic, sclera non-icteric, mucus membranes are moist. Neck: JVD not elevated. Lungs: Clear bilaterally to auscultation without wheezes, rales, or rhonchi. Breathing is unlabored on O2 [place]. Heart: RRR with normal S1, S2. No murmurs, rubs, or gallops appreciated. Abdomen: Soft, non-tender, non-distended with normoactive bowel sounds. No rebound/guarding. No obvious abdominal masses. Musculoskeletal:  Strength and tone appear normal for age. Lower extremities: R stump with trace edema and erythema noted near incision side. L AKA without erythema/edema.  Neuro: Alert, Moves all extremities spontaneously. Psych:  Responds to questions appropriately with a normal affect. Dialysis Access: LUE AVF + bruit  Allergies  Allergen Reactions  . Morphine And Related Other (See Comments)    hallucinations  . Tygacil [Tigecycline] Nausea And Vomiting and Other (See Comments)  . Imodium [Loperamide] Itching   Prior to Admission medications   Medication Sig Start Date End Date Taking? Authorizing Provider  acetaminophen (TYLENOL) 500 MG tablet Take 500-1,000 mg by mouth every 6 (six) hours as needed for mild pain or fever.    Yes [provider]  Amino Acids-Protein Hydrolys (FEEDING SUPPLEMENT, PRO-STAT SUGAR FREE 64,) LIQD Take 30 mLs by mouth 2 (two) times daily. 01/05/19  Yes Debbe Odea, MD  amLODipine (NORVASC) 5 MG tablet Take 5 mg by mouth every evening.    Yes [provider]  aspirin EC 81 MG EC tablet Take 2 tablets (162 mg total) by mouth daily. Patient taking differently: Take 81 mg by mouth at bedtime.  01/20/17  Yes Patrecia Pour, Christean Grief, MD  carvedilol (COREG) 12.5 MG tablet Take 1 tablet (12.5 mg total) by mouth 2 (two) times daily with a meal. 02/08/17  Yes Bonnell Public, MD  CREON [phone] units CPEP  capsule Take 1-2 capsules by mouth See admin instructions. Take 2 capsules by mouth with meals and 1 capsule before snacks 03/20/19  Yes [provider]  Darbepoetin Alfa (ARANESP) 150 MCG/0.3ML SOSY injection Inject 0.3 mLs (150 mcg total) into the vein every Friday with hemodialysis. 02/11/17  Yes Dana Allan I, MD  gabapentin (NEURONTIN) 100 MG capsule Take 200 mg by mouth daily.  09/27/18  Yes [provider]  heparin 1000 UNIT/ML injection Inject 4 mLs into the vein See admin instructions. Every hemodialysis treatment 08/14/18 08/13/19 Yes [provider]  hydrOXYzine (ATARAX/VISTARIL) 50 MG  tablet Take 50 mg by mouth 3 (three) times daily as needed for itching.    Yes [provider]  methocarbamol (ROBAXIN) 500 MG tablet Take 1 tablet (500 mg total) by mouth every 6 (six) hours as needed for muscle spasms. 01/05/19  Yes Debbe Odea, MD  montelukast (SINGULAIR) 10 MG tablet Take 10 mg by mouth at bedtime as needed (allergies).  08/14/18  Yes [provider]  multivitamin (RENA-VIT) TABS tablet Take 1 tablet by mouth at bedtime. 02/08/17  Yes Dana Allan I, MD  sertraline (ZOLOFT) 100 MG tablet Take 100 mg by mouth daily. 04/18/19  Yes [provider]  temazepam (RESTORIL) 30 MG capsule Take 30 mg by mouth at bedtime.    Yes [provider]  bisacodyl (DULCOLAX) 10 MG suppository Place 1 suppository (10 mg total) rectally daily as needed for moderate constipation. Patient not taking: Reported on 05/21/2019 01/05/19   Debbe Odea, MD  docusate sodium (COLACE) 100 MG capsule Take 1 capsule (100 mg total) by mouth 2 (two) times daily. Patient not taking: Reported on 05/21/2019 01/05/19   Debbe Odea, MD  menthol-cetylpyridinium (CEPACOL) 3 MG lozenge Take 1 lozenge (3 mg total) by mouth as needed for sore throat. Patient not taking: Reported on 05/21/2019 01/05/19   Debbe Odea, MD  oxyCODONE (OXY IR/ROXICODONE) 5 MG immediate  release tablet Take 1 tablet (5 mg total) by mouth every 6 (six) hours as needed for moderate pain, severe pain or breakthrough pain. Patient not taking: Reported on 05/21/2019 01/25/19   Persons, Bevely Palmer, PA  polyethylene glycol (MIRALAX / GLYCOLAX) 17 g packet Take 17 g by mouth daily as needed for mild constipation. Patient not taking: Reported on 05/21/2019 01/05/19   Debbe Odea, MD   Current Facility-Administered Medications  Medication Dose Route Frequency Provider Last Rate Last Admin  . acetaminophen (TYLENOL) tablet 650 mg  650 mg Oral Q6H PRN Ina Homes, MD       Or  . acetaminophen (TYLENOL) suppository 650 mg  650 mg Rectal Q6H PRN Helberg, Larkin Ina, MD      . amLODipine (NORVASC) tablet 5 mg  5 mg Oral QPM Helberg, Justin, MD      . aspirin EC tablet 81 mg  81 mg Oral QHS Helberg, Justin, MD      . carvedilol (COREG) tablet 12.5 mg  12.5 mg Oral BID WC Helberg, Justin, MD      . cefTRIAXone (ROCEPHIN) 2 g in sodium chloride 0.9 % 100 mL IVPB  2 g Intravenous Once Helberg, Larkin Ina, MD      . feeding supplement (PRO-STAT SUGAR FREE 64) liquid 30 mL  30 mL Oral BID Helberg, Justin, MD      . gabapentin (NEURONTIN) capsule 200 mg  200 mg Oral Daily Helberg, Justin, MD      . heparin injection 5,000 Units  5,000 Units Subcutaneous Q8H Helberg, Justin, MD      . hydrOXYzine (ATARAX/VISTARIL) tablet 50 mg  50 mg Oral TID PRN Ina Homes, MD      . lipase/protease/amylase (CREON) capsule 36,000 Units  36,000 Units Oral PRN Velna Ochs, MD      . lipase/protease/amylase (CREON) capsule 72,000 Units  72,000 Units Oral TID with meals Helberg, Larkin Ina, MD      . methocarbamol (ROBAXIN) tablet 500 mg  500 mg Oral Q6H PRN Helberg, Justin, MD      . montelukast (SINGULAIR) tablet 10 mg  10 mg Oral QHS PRN Ina Homes, MD      .  multivitamin (RENA-VIT) tablet 1 tablet  1 tablet Oral QHS Helberg, Justin, MD      . polyethylene glycol (MIRALAX / GLYCOLAX) packet 17 g  17 g Oral  Daily PRN Helberg, Larkin Ina, MD      . promethazine (PHENERGAN) tablet 12.5 mg  12.5 mg Oral Q6H PRN Ina Homes, MD      . sertraline (ZOLOFT) tablet 100 mg  100 mg Oral Daily Helberg, Justin, MD      . temazepam (RESTORIL) capsule 30 mg  30 mg Oral QHS Helberg, Justin, MD      . vancomycin (VANCOCIN) IVPB 1000 mg/200 mL premix  1,000 mg Intravenous Once Ina Homes, MD      . Derrill Memo ON 05/23/2019] vancomycin (VANCOCIN) IVPB 750 mg/150 ml premix  750 mg Intravenous Q M,W,F-HD Ina Homes, MD       Current Outpatient Medications  Medication Sig Dispense Refill  . acetaminophen (TYLENOL) 500 MG tablet Take 500-1,000 mg by mouth every 6 (six) hours as needed for mild pain or fever.     . Amino Acids-Protein Hydrolys (FEEDING SUPPLEMENT, PRO-STAT SUGAR FREE 64,) LIQD Take 30 mLs by mouth 2 (two) times daily. 887 mL 0  . amLODipine (NORVASC) 5 MG tablet Take 5 mg by mouth every evening.     Marland Kitchen aspirin EC 81 MG EC tablet Take 2 tablets (162 mg total) by mouth daily. (Patient taking differently: Take 81 mg by mouth at bedtime. )    . carvedilol (COREG) 12.5 MG tablet Take 1 tablet (12.5 mg total) by mouth 2 (two) times daily with a meal. 60 tablet 0  . CREON [id number] units CPEP capsule Take 1-2 capsules by mouth See admin instructions. Take 2 capsules by mouth with meals and 1 capsule before snacks    . Darbepoetin Alfa (ARANESP) 150 MCG/0.3ML SOSY injection Inject 0.3 mLs (150 mcg total) into the vein every Friday with hemodialysis. 1.68 mL 0  . gabapentin (NEURONTIN) 100 MG capsule Take 200 mg by mouth daily.     . heparin 1000 UNIT/ML injection Inject 4 mLs into the vein See admin instructions. Every hemodialysis treatment    . hydrOXYzine (ATARAX/VISTARIL) 50 MG tablet Take 50 mg by mouth 3 (three) times daily as needed for itching.     . methocarbamol (ROBAXIN) 500 MG tablet Take 1 tablet (500 mg total) by mouth every 6 (six) hours as needed for muscle spasms. 60 tablet 0  .  montelukast (SINGULAIR) 10 MG tablet Take 10 mg by mouth at bedtime as needed (allergies).     . multivitamin (RENA-VIT) TABS tablet Take 1 tablet by mouth at bedtime. 30 tablet 0  . sertraline (ZOLOFT) 100 MG tablet Take 100 mg by mouth daily.    . temazepam (RESTORIL) 30 MG capsule Take 30 mg by mouth at bedtime.     . bisacodyl (DULCOLAX) 10 MG suppository Place 1 suppository (10 mg total) rectally daily as needed for moderate constipation. (Patient not taking: Reported on 05/21/2019) 12 suppository 0  . docusate sodium (COLACE) 100 MG capsule Take 1 capsule (100 mg total) by mouth 2 (two) times daily. (Patient not taking: Reported on 05/21/2019) 10 capsule 0  . menthol-cetylpyridinium (CEPACOL) 3 MG lozenge Take 1 lozenge (3 mg total) by mouth as needed for sore throat. (Patient not taking: Reported on 05/21/2019) 100 tablet 12  . oxyCODONE (OXY IR/ROXICODONE) 5 MG immediate release tablet Take 1 tablet (5 mg total) by mouth every 6 (six) hours as needed for moderate pain,  severe pain or breakthrough pain. (Patient not taking: Reported on 05/21/2019) 30 tablet 0  . polyethylene glycol (MIRALAX / GLYCOLAX) 17 g packet Take 17 g by mouth daily as needed for mild constipation. (Patient not taking: Reported on 05/21/2019) 14 each 0   Labs: Basic Metabolic Panel: Recent Labs  Lab 05/21/19 0739  NA 138  K 5.4*  CL 97*  CO2 24  GLUCOSE 174*  BUN 52*  CREATININE 8.97*  CALCIUM 9.2   Liver Function Tests: Recent Labs  Lab 05/21/19 0739  AST 17  ALT 12  ALKPHOS 119  BILITOT 1.5*  PROT 7.5  ALBUMIN 2.5*   CBC: Recent Labs  Lab 05/21/19 0739  WBC 11.8*  NEUTROABS 9.8*  HGB 11.2*  HCT 35.7*  MCV 95.5  PLT 149*   Dialysis Orders: Center: [facility]  on MWF. 180Nre, BFR 400, DFR Auto 1.5, EDW 63.5kg, 2K/2Ca Heparin 4000 unit bolus Venofer 100mg  IV q HD- received 5/10 doses Mircera 100 mcg q 2 weeks- last dose 4/23  Assessment/Plan: 1.  R stump cellulitis:  Presented to the ED with R stump pain. Mild leukocytosis with WBC 11.8. Wound culture and BC ordered. Pharmacy starting vancomycin. Management per primary. 2.  ESRD:  Dialyzes on MWF schedule but review of outpatient records shows he has missed a week of dialysis. K+ mildly elevated at 5.4. Will plan HD today then continue MWF schedule.  3.  Hypertension/volume: BP well controlled, euvolemic on exam, under EDW by weights. Will plan for HD today with UF goal 1-2L.  4.  Anemia: Hgb 9.8. Due for ESA- will order with HD today. Holding IV Fe in setting of acute infection. Patient denies any recent blood loss. 5.  Metabolic bone disease: CorrCa 10.4. Not on VDRA. Continue renvela.   6.  Nutrition:  Renal diet/fluid restrictions. On protein supplements as outpatient- will order prostat.   Anice Paganini, PA-C 05/21/2019, 10:42 AM  [facility] Pager: [phone]

## 2019-05-21 NOTE — ED Provider Notes (Signed)
[facility] EMERGENCY DEPARTMENT Provider Note  CSN: [id number] Arrival date & time: 05/21/19 0715    History Chief Complaint  Patient presents with  . Leg Pain    HPI  Matthew Kline is a 59 y.o. male with extensive PMH which includes ESRD on HD (MWF) and BLE amputations presents with numerous vague complaints, most of which seem to be chronic but he states his primary acute complaint is moderate aching pain in his R BKA stump. This leg was amputated in Dec 2020. He is not using a prosthesis. He denies any injuries. He reports some redness but admits this does not seem out of the ordinary for him.He denies any fevers at home.  He also reports frequent diarrhea and rectal pain with wiping. Denies bloody stools. He states his daughter noticed a lump on his LUQ abdomen this morning, he cannot see or feel it.  He has not gone to dialysis since Wed of last week, missed Friday because he wasn't feeling well and is due again this morning.    Past Medical History:  Diagnosis Date  . Anemia, unspecified   . Anxiety   . Arthralgia 2010   polyarticular  . Arthritis    "back, knees" (01/10/2017)  . Cancer [facility])    "kidney area" (01/10/2017)  . CHF (congestive heart failure) ([place]) 07/25/2009   denies  . Chronic lower back pain   . Coronary artery disease   . Coughing    pt. reports that he has drainage from sinus infection  . Diabetic foot ulcer ([place])   . Diabetic neuropathy ([place])   . ESRD (end stage renal disease) on dialysis [facility])    started 12/2012; "MWF; [place] "  (01/10/2017)  . GERD (gastroesophageal reflux disease)    hx "before I lost weight", no problem 9 years  . Hemodialysis access site with mature fistula ([place])   . Hemorrhoids, internal 10/2011   small  . High cholesterol   . History of blood transfusion    "related to the anemia"  . Hypertension   . Insomnia, unspecified   . 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 Long term (current) use  of anticoagulants   . Myocardial infarction ([place]) 1995  . Orthostatic hypotension   . Osteomyelitis of foot, left, acute ([place])   . Other chronic postoperative pain   . Pneumonia    "probably twice" (01/10/2017)  . Polymyalgia rheumatica ([place])   . Renal insufficiency   . Sleep apnea    "lost weight; no more problem" (01/10/2017)  . Stroke ([place]) 01/10/06   denies residual on 05/09/2014  . Type II diabetes mellitus ([place]) dx'd 1995  . Unspecified hereditary and idiopathic peripheral neuropathy    feet  . Unspecified osteomyelitis, site unspecified   . Unspecified vitamin D deficiency     Past Surgical History:  Procedure Laterality Date  . ABDOMINAL AORTOGRAM N/A 08/25/2016   Procedure: ABDOMINAL AORTOGRAM;  Surgeon: Wellington Hampshire, MD;  Location: [place] [facility];  Service: Cardiovascular;  Laterality: N/A;  . AMPUTATION  01/21/2012   Procedure: AMPUTATION RAY;  Surgeon: [name], MD;  Location: [place];  Service: Orthopedics;  Laterality: Left;  Left Foot 4th Ray Amputation  . AMPUTATION Left 05/04/2013   Procedure: AMPUTATION DIGIT;  Surgeon: [name], MD;  Location: [place];  Service: Orthopedics;  Laterality: Left;  Left Great Toe Amputation at [facility]  . AMPUTATION Left 01/14/2017   Procedure: AMPUTATION ABOVE LEFT  KNEE;  Surgeon: [name], MD;  Location: [place];  Service: Orthopedics;  Laterality: Left;  . AMPUTATION Right 12/31/2018   Procedure: AMPUTATION BELOW KNEE;  Surgeon: [name], MD;  Location: [place];  Service: Orthopedics;  Laterality: Right;  . ANTERIOR CERVICAL DECOMP/DISCECTOMY FUSION  02/2011  . APPLICATION OF WOUND VAC Right 12/31/2018   Procedure: Application Of Wound Vac;  Surgeon: [name], MD;  Location: [place];  Service: Orthopedics;  Laterality: Right;  . BACK SURGERY    . BASCILIC VEIN TRANSPOSITION Left 10/19/2012   Procedure: BASCILIC VEIN TRANSPOSITION;  Surgeon: Serafina Mitchell, MD;  Location: [place];  Service: Vascular;  Laterality:  Left;  . CARDIAC CATHETERIZATION     "before bypass"  . CHOLECYSTECTOMY N/A 08/02/2018   Procedure: LAPAROSCOPIC CHOLECYSTECTOMY WITH INTRAOPERATIVE CHOLANGIOGRAM;  Surgeon: Donnie Mesa, MD;  Location: [place];  Service: General;  Laterality: N/A;  . CORONARY ARTERY BYPASS GRAFT     x 5 with lima at [place] SPACERS Left 08/07/2014   Procedure: Replace Left Total Knee Arthroplasty,  Place Antibiotic Spacer;  Surgeon: [name], MD;  Location: [place];  Service: Orthopedics;  Laterality: Left;  . I & D EXTREMITY Left 05/09/2014   Procedure: Irrigation and Debridement Left Knee and Closure of Total Knee Arthroplasty Incision;  Surgeon: [name], MD;  Location: [place];  Service: Orthopedics;  Laterality: Left;  . I & D KNEE WITH POLY EXCHANGE Left 05/31/2014   Procedure: IRRIGATION AND DEBRIDEMENT LEFT KNEE, PLACE ANTIBIOTIC BEADS,  POLY EXCHANGE;  Surgeon: [name], MD;  Location: [place];  Service: Orthopedics;  Laterality: Left;  . INGUINAL HERNIA REPAIR Bilateral 11/21/2018   Procedure: BILATERAL INGUINAL HERNIA REPAIR WITH MESH;  Surgeon: Coralie Keens, MD;  Location: [place];  Service: General;  Laterality: Bilateral;  . IRRIGATION AND DEBRIDEMENT KNEE Left 01/12/2017   Procedure: IRRIGATION AND DEBRIDEMENT LEFT KNEE;  Surgeon: [name], MD;  Location: [place];  Service: Orthopedics;  Laterality: Left;  . JOINT REPLACEMENT    . KNEE ARTHROSCOPY Left 08-25-2012  . LOWER EXTREMITY ANGIOGRAPHY Left 08/25/2016   Procedure: Lower Extremity Angiography;  Surgeon: Wellington Hampshire, MD;  Location: [place] [facility];  Service: Cardiovascular;  Laterality: Left;  . PERIPHERAL VASCULAR BALLOON ANGIOPLASTY Left 08/25/2016   Procedure: PERIPHERAL VASCULAR BALLOON ANGIOPLASTY;  Surgeon: Wellington Hampshire, MD;  Location: [place] [facility];  Service: Cardiovascular;  Laterality: Left;  lt peroneal and ant tibial arteries cutting balloon   . REFRACTIVE SURGERY Bilateral   . TOE AMPUTATION Bilateral    "I've lost 7 toes over the last 7 years" (05/09/2014)  . TOE SURGERY Left April 2015   Big toe removed on left foot.  . TONSILLECTOMY    . TOTAL KNEE ARTHROPLASTY Left 04/10/2014   Procedure: TOTAL KNEE ARTHROPLASTY;  Surgeon: [name], MD;  Location: [place];  Service: Orthopedics;  Laterality: Left;  . TOTAL KNEE REVISION Left 10/25/2014   Procedure: LEFT TOTAL KNEE REVISION;  Surgeon: [name], MD;  Location: [place];  Service: Orthopedics;  Laterality: Left;  . TOTAL KNEE REVISION Left 11/26/2015   Procedure: Removal Left Total Knee Arthroplasty, Hinged Total Knee Arthroplasty;  Surgeon: [name], MD;  Location: [place];  Service: Orthopedics;  Laterality: Left;  . UVULOPALATOPHARYNGOPLASTY, TONSILLECTOMY AND SEPTOPLASTY  ~ 1989  . WOUND DEBRIDEMENT Left 05/09/2014   Dehiscence Left Total  Knee Arthroplasty Incision    Family History  Problem Relation Age of Onset  . Hypertension Mother   . Cancer Mother 30       Ovarian  . Heart disease Maternal Aunt   . Stroke Maternal Grandfather     Social History   Tobacco Use  . Smoking status: Former Smoker    Packs/day: 0.12    Years: 32.00    Pack years: 3.84    Types: Cigarettes    Quit date: 01/01/2019    Years since quitting: 0.3  . Smokeless tobacco: Never Used  . Tobacco comment: 1 per week  Substance Use Topics  . Alcohol use: No    Alcohol/week: 0.0 standard drinks  . Drug use: No     Home Medications Prior to Admission medications   Medication Sig Start Date End Date Taking? Authorizing Provider  acetaminophen (TYLENOL) 500 MG tablet Take 500-1,000 mg by mouth every 6 (six) hours as needed for mild pain or fever.    Yes [provider]  Amino Acids-Protein Hydrolys (FEEDING SUPPLEMENT, PRO-STAT SUGAR FREE 64,) LIQD Take 30 mLs by mouth 2 (two) times daily. 01/05/19  Yes Debbe Odea, MD  amLODipine (NORVASC) 5 MG tablet Take 5 mg by  mouth every evening.    Yes [provider]  aspirin EC 81 MG EC tablet Take 2 tablets (162 mg total) by mouth daily. Patient taking differently: Take 81 mg by mouth at bedtime.  01/20/17  Yes Patrecia Pour, Christean Grief, MD  carvedilol (COREG) 12.5 MG tablet Take 1 tablet (12.5 mg total) by mouth 2 (two) times daily with a meal. 02/08/17  Yes Bonnell Public, MD  CREON [phone] units CPEP capsule Take 1-2 capsules by mouth See admin instructions. Take 2 capsules by mouth with meals and 1 capsule before snacks 03/20/19  Yes [provider]  Darbepoetin Alfa (ARANESP) 150 MCG/0.3ML SOSY injection Inject 0.3 mLs (150 mcg total) into the vein every Friday with hemodialysis. 02/11/17  Yes Dana Allan I, MD  gabapentin (NEURONTIN) 100 MG capsule Take 200 mg by mouth daily.  09/27/18  Yes [provider]  heparin 1000 UNIT/ML injection Inject 4 mLs into the vein See admin instructions. Every hemodialysis treatment 08/14/18 08/13/19 Yes [provider]  hydrOXYzine (ATARAX/VISTARIL) 50 MG tablet Take 50 mg by mouth 3 (three) times daily as needed for itching.    Yes [provider]  methocarbamol (ROBAXIN) 500 MG tablet Take 1 tablet (500 mg total) by mouth every 6 (six) hours as needed for muscle spasms. 01/05/19  Yes Debbe Odea, MD  montelukast (SINGULAIR) 10 MG tablet Take 10 mg by mouth at bedtime as needed (allergies).  08/14/18  Yes [provider]  multivitamin (RENA-VIT) TABS tablet Take 1 tablet by mouth at bedtime. 02/08/17  Yes Dana Allan I, MD  sertraline (ZOLOFT) 100 MG tablet Take 100 mg by mouth daily. 04/18/19  Yes [provider]  temazepam (RESTORIL) 30 MG capsule Take 30 mg by mouth at bedtime.    Yes [provider]  bisacodyl (DULCOLAX) 10 MG suppository Place 1 suppository (10 mg total) rectally daily as needed for moderate constipation. Patient not taking: Reported on 05/21/2019 01/05/19   Debbe Odea, MD   docusate sodium (COLACE) 100 MG capsule Take 1 capsule (100 mg total) by mouth 2 (two) times daily. Patient not taking: Reported on 05/21/2019 01/05/19   Debbe Odea, MD  menthol-cetylpyridinium (CEPACOL) 3 MG lozenge Take 1 lozenge (3 mg total) by mouth  as needed for sore throat. Patient not taking: Reported on 05/21/2019 01/05/19   Debbe Odea, MD  oxyCODONE (OXY IR/ROXICODONE) 5 MG immediate release tablet Take 1 tablet (5 mg total) by mouth every 6 (six) hours as needed for moderate pain, severe pain or breakthrough pain. Patient not taking: Reported on 05/21/2019 01/25/19   Persons, Bevely Palmer, PA  polyethylene glycol (MIRALAX / GLYCOLAX) 17 g packet Take 17 g by mouth daily as needed for mild constipation. Patient not taking: Reported on 05/21/2019 01/05/19   Debbe Odea, MD     Allergies    Morphine and related, Tygacil [tigecycline], and Imodium [loperamide]   Review of Systems   Review of Systems A comprehensive review of systems was completed and negative except as noted in HPI.     Physical Exam BP 140/81 (BP Location: Right Arm)   Pulse 81   Temp 98.5 F (36.9 C) (Oral)   Resp 17   Wt 62 kg Comment: dry weight  SpO2 96%   BMI 19.06 kg/m   Physical Exam Vitals and nursing note reviewed.  Constitutional:      Appearance: Normal appearance.  HENT:     Head: Normocephalic and atraumatic.     Nose: Nose normal.     Mouth/Throat:     Mouth: Mucous membranes are moist.  Eyes:     Extraocular Movements: Extraocular movements intact.     Conjunctiva/sclera: Conjunctivae normal.  Cardiovascular:     Rate and Rhythm: Normal rate and regular rhythm.  Pulmonary:     Effort: Pulmonary effort is normal.     Breath sounds: Normal breath sounds.  Abdominal:     General: Abdomen is flat.     Palpations: Abdomen is soft. There is no mass.     Tenderness: There is no abdominal tenderness.  Musculoskeletal:     Cervical back: Neck supple.     Comments: S/p L AKA and R  BKA, there is erythema, warmth and tenderness to R BKA stump. A 1cm ulceration is present on the surgical incision line with purulent drainage.  Dialysis access in LUE with palpable thrill  Skin:    General: Skin is warm and dry.  Neurological:     General: No focal deficit present.     Mental Status: He is alert.  Psychiatric:        Mood and Affect: Mood normal.      ED Results / Procedures / Treatments   Labs (all labs ordered are listed, but only abnormal results are displayed) Labs Reviewed  COMPREHENSIVE METABOLIC PANEL - Abnormal; Notable for the following components:      Result Value   Potassium 5.4 (*)    Chloride 97 (*)    Glucose, Bld 174 (*)    BUN 52 (*)    Creatinine, Ser 8.97 (*)    Albumin 2.5 (*)    Total Bilirubin 1.5 (*)    GFR calc non Af Amer 6 (*)    GFR calc Af Amer 7 (*)    Anion gap 17 (*)    All other components within normal limits  CBC WITH DIFFERENTIAL/PLATELET - Abnormal; Notable for the following components:   WBC 11.8 (*)    RBC 3.74 (*)    Hemoglobin 11.2 (*)    HCT 35.7 (*)    RDW 17.6 (*)    Platelets 149 (*)    nRBC 0.3 (*)    Neutro Abs 9.8 (*)    All other components within normal  limits  GLUCOSE, CAPILLARY - Abnormal; Notable for the following components:   Glucose-Capillary 118 (*)    All other components within normal limits  AEROBIC CULTURE (SUPERFICIAL SPECIMEN)  SARS CORONAVIRUS 2 BY RT PCR (HOSPITAL ORDER, PERFORMED IN [place] [facility])  CULTURE, BLOOD (ROUTINE X 2)  CULTURE, BLOOD (ROUTINE X 2)  HEMOGLOBIN A1C  CBC    EKG EKG Interpretation  Date/Time:  Monday May 21 2019 08:27:50 EDT Ventricular Rate:  73 PR Interval:    QRS Duration: 97 QT Interval:  398 QTC Calculation: 439 R Axis:   95 Text Interpretation: Sinus rhythm Prolonged PR interval Right axis deviation Borderline T abnormalities, inferior leads Since last tracing Nonspecific T wave abnormality NOW PRESENT Confirmed by Calvert Cantor  [phone]) on 05/21/2019 8:31:00 AM    Radiology No results found.  Procedures Procedures  Medications Ordered in the ED Medications  vancomycin (VANCOCIN) IVPB 1000 mg/200 mL premix (has no administration in time range)  vancomycin (VANCOCIN) IVPB 750 mg/150 ml premix (has no administration in time range)  feeding supplement (PRO-STAT SUGAR FREE 64) liquid 30 mL (30 mLs Oral Not Given 05/21/19 1115)  amLODipine (NORVASC) tablet 5 mg (has no administration in time range)  aspirin EC tablet 81 mg (has no administration in time range)  carvedilol (COREG) tablet 12.5 mg (12.5 mg Oral Not Given 05/21/19 1731)  lipase/protease/amylase (CREON) capsule 72,000 Units (72,000 Units Oral Not Given 05/21/19 1715)  gabapentin (NEURONTIN) capsule 200 mg (has no administration in time range)  hydrOXYzine (ATARAX/VISTARIL) tablet 50 mg (50 mg Oral Given 05/21/19 1422)  methocarbamol (ROBAXIN) tablet 500 mg (has no administration in time range)  montelukast (SINGULAIR) tablet 10 mg (has no administration in time range)  multivitamin (RENA-VIT) tablet 1 tablet (has no administration in time range)  sertraline (ZOLOFT) tablet 100 mg (has no administration in time range)  temazepam (RESTORIL) capsule 30 mg (has no administration in time range)  heparin injection 5,000 Units (5,000 Units Subcutaneous Not Given 05/21/19 1400)  acetaminophen (TYLENOL) tablet 650 mg (has no administration in time range)    Or  acetaminophen (TYLENOL) suppository 650 mg (has no administration in time range)  polyethylene glycol (MIRALAX / GLYCOLAX) packet 17 g (has no administration in time range)  promethazine (PHENERGAN) tablet 12.5 mg (has no administration in time range)  lipase/protease/amylase (CREON) capsule 36,000 Units (has no administration in time range)  sevelamer carbonate (RENVELA) tablet 2,400 mg (2,400 mg Oral Not Given 05/21/19 1714)  Chlorhexidine Gluconate Cloth 2 % PADS 6 each (0 each Topical Duplicate 07/16/21  7628)  Darbepoetin Alfa (ARANESP) injection 60 mcg (has no administration in time range)  insulin aspart (novoLOG) injection 0-6 Units (0 Units Subcutaneous Not Given 05/21/19 1714)  insulin aspart (novoLOG) injection 0-5 Units (has no administration in time range)  HYDROmorphone (DILAUDID) injection 0.5-1 mg (has no administration in time range)  liver oil-zinc oxide (DESITIN) 40 % ointment 1 application (has no administration in time range)  fentaNYL (SUBLIMAZE) injection 50 mcg (50 mcg Intravenous Given 05/21/19 0858)  cefTRIAXone (ROCEPHIN) 2 g in sodium chloride 0.9 % 100 mL IVPB (0 g Intravenous Stopped 05/21/19 1500)  fentaNYL (SUBLIMAZE) injection 25 mcg (25 mcg Intravenous Given 05/21/19 1506)     ED Course  I have reviewed the triage vital signs and the nursing notes.  Pertinent labs & imaging results that were available during my care of the patient were reviewed by me and considered in my medical decision making (see chart for details).  Clinical Course as of May 20 1853  Mon May 21, 2019  0748 Patient here with numerous chronic complaints. His only acute issues seems to be pain and infection in his R BKA stump. Wound culture was sent. CBC to check for leukocytosis. Blood cultures. He is also overdue for his dialysis. BMP to check elytes.    [CS]  [id number] Spoke with Dr. Hollie Salk. Nephrology who will arrange for patient to get dialysis. Ortho paged for wound infection.    [CS]  [phone] Spoke with Dr. Sharol Given who will evaluate BKA infection. He would like to go ahead and initiate Abx, does not need imaging. Will admit for to medicine for further evaluation.    [CS]  [phone] Spoke with Dr. Tarri Abernethy, IM Resident who will admit.    [CS]    Clinical Course User Index [CS] Truddie Hidden, MD    MDM Rules/Calculators/A&P MDM  Final Clinical Impression(s) / ED Diagnoses Final diagnoses:  Cellulitis of right leg  ESRD on hemodialysis [facility])    Rx / DC Orders ED Discharge Orders    None        Truddie Hidden, MD 05/21/19 [phone]

## 2019-05-21 NOTE — H&P (Signed)
Date: 05/21/2019               Patient Name:  Matthew Kline MRN: [id number]  DOB: 07/09/1960 Age / Sex: 59 y.o., male   PCP: [facility] [place] Service: Internal Medicine Teaching Service         Attending Physician: Dr. Velna Ochs, MD    First Contact: Dr. Sheppard Coil Pager: [phone]  Second Contact: Dr. Koleen Distance Pager: [phone]       After Hours (After 5p/  First Contact Pager: [phone]  weekends / holidays): Second Contact Pager: [phone]   Chief Complaint: RLE pain  History of Present Illness: Chrostopher Kline is a 59 y.o male with ESRD, pancreatic insufficiency, HTN, DM, and bilateral LE amputations who presented to the ED with worsening RLE pain. The patient was extremely tired and given fentanyl prior to our interview. Therefore the patient was sedated and kept falling asleep during the interview. This limited our ability to gather information and therefore, history was primarily obtain via chart review.   The patient originally presented to the ED with multiple complaints including diffuse pain and diarrhea. The diarrhea appears to be a chronic issue and his primary concern was RLE pain at the site of his BKA. He last went to HD on 5/5 and missed his 5/7 appointment due to his RLE pain. He has noticed some purulent discharge but states it is minimal. The pain is more bothersome than anything and that is why he came to the ED. He has not tried anything for the pain. He denies fevers, chills, SHOB, orthopnea, chest pain, palpitations, dizziness, abdominal pain, N/V, diarrhea, bloody stools.   Meds:  Current Meds  Medication Sig  . acetaminophen (TYLENOL) 500 MG tablet Take 500-1,000 mg by mouth every 6 (six) hours as needed for mild pain or fever.   . Amino Acids-Protein Hydrolys (FEEDING SUPPLEMENT, PRO-STAT SUGAR FREE 64,) LIQD Take 30 mLs by mouth 2 (two) times daily.  Marland Kitchen amLODipine (NORVASC) 5 MG tablet Take 5 mg by mouth every evening.   Marland Kitchen  aspirin EC 81 MG EC tablet Take 2 tablets (162 mg total) by mouth daily. (Patient taking differently: Take 81 mg by mouth at bedtime. )  . carvedilol (COREG) 12.5 MG tablet Take 1 tablet (12.5 mg total) by mouth 2 (two) times daily with a meal.  . CREON 36000-114000 units CPEP capsule Take 1-2 capsules by mouth See admin instructions. Take 2 capsules by mouth with meals and 1 capsule before snacks  . Darbepoetin Alfa (ARANESP) 150 MCG/0.3ML SOSY injection Inject 0.3 mLs (150 mcg total) into the vein every Friday with hemodialysis.  Marland Kitchen gabapentin (NEURONTIN) 100 MG capsule Take 200 mg by mouth daily.   . heparin 1000 UNIT/ML injection Inject 4 mLs into the vein See admin instructions. Every hemodialysis treatment  . hydrOXYzine (ATARAX/VISTARIL) 50 MG tablet Take 50 mg by mouth 3 (three) times daily as needed for itching.   . methocarbamol (ROBAXIN) 500 MG tablet Take 1 tablet (500 mg total) by mouth every 6 (six) hours as needed for muscle spasms.  . montelukast (SINGULAIR) 10 MG tablet Take 10 mg by mouth at bedtime as needed (allergies).   . multivitamin (RENA-VIT) TABS tablet Take 1 tablet by mouth at bedtime.  . sertraline (ZOLOFT) 100 MG tablet Take 100 mg by mouth daily.  . temazepam (RESTORIL) 30 MG capsule Take 30 mg by mouth at bedtime.   . [DISCONTINUED] sevelamer  carbonate (RENVELA) 800 MG tablet Take 1,600-3,200 mg by mouth 3 (three) times daily with meals. 2-4 tabs with each meal   Allergies: Allergies as of 05/21/2019 - Review Complete 05/21/2019  Allergen Reaction Noted  . Morphine and related Other (See Comments) 02/01/2012  . Tygacil [tigecycline] Nausea And Vomiting and Other (See Comments) 09/22/2011  . Imodium [loperamide] Itching 07/26/2018   Past Medical History:  Diagnosis Date  . Anemia, unspecified   . Anxiety   . Arthralgia 2010   polyarticular  . Arthritis    "back, knees" (01/10/2017)  . Cancer [facility])    "kidney area" (01/10/2017)  . CHF (congestive heart  failure) ([place]) 07/25/2009   denies  . Chronic lower back pain   . Coronary artery disease   . Coughing    pt. reports that he has drainage from sinus infection  . Diabetic foot ulcer ([place])   . Diabetic neuropathy ([place])   . ESRD (end stage renal disease) on dialysis [facility])    started 12/2012; "MWF; [place] "  (01/10/2017)  . GERD (gastroesophageal reflux disease)    hx "before I lost weight", no problem 9 years  . Hemodialysis access site with mature fistula ([place])   . Hemorrhoids, internal 10/2011   small  . High cholesterol   . History of blood transfusion    "related to the anemia"  . Hypertension   . Insomnia, unspecified   . Lacunar infarction ([place]) 2006   RUE/RLE, speech  . Long term (current) use of anticoagulants   . Myocardial infarction ([place]) 1995  . Orthostatic hypotension   . Osteomyelitis of foot, left, acute ([place])   . Other chronic postoperative pain   . Pneumonia    "probably twice" (01/10/2017)  . Polymyalgia rheumatica ([place])   . Renal insufficiency   . Sleep apnea    "lost weight; no more problem" (01/10/2017)  . Stroke ([place]) 01/10/06   denies residual on 05/09/2014  . Type II diabetes mellitus ([place]) dx'd 1995  . Unspecified hereditary and idiopathic peripheral neuropathy    feet  . Unspecified osteomyelitis, site unspecified   . Unspecified vitamin D deficiency    Family History: Unable to obtain as the patient was sedated.   Social History: Unable to obtain as the patient was sedated.   Review of Systems: A complete ROS was negative except as per HPI.   Physical Exam: Blood pressure 123/83, pulse 70, temperature 98.5 F (36.9 C), temperature source Oral, resp. rate 20, weight 62 kg, SpO2 98 %.  General: Well nourished male in no acute distress HENT: Normocephalic, atraumatic, moist mucus membranes Pulm: Good air movement with no wheezing or crackles  CV: RRR, Systolic murmur, + JVD Abdomen: Active bowel sounds, soft, non-distended, no  tenderness to palpation  Extremities: Left above the knee amputation, right below the knee. At site of right BKA there is erythema, tenderness, and purulent drainage.  Skin: Warm and dry  Neuro: Sedated  EKG: personally reviewed my interpretation is sinus rhythm with indeterminate axis and PR prolongation. Possible P mitral indicating LAE.  Assessment & Plan by Problem: Active Problems:   Cellulitis  Chrostopher Kauffmann is a 59 y.o male with ESRD, pancreatic insufficiency, HTN, DM, and bilateral LE amputations who presented to the ED with worsening pain, erythema, and purulent discharge at his RLE BKA site. All finding consistent with cellulitis. He was subsequently admitted for further evaluation/management.   Purulent Cellulitis - Worsening pain, erythema, and purulent discharge at his RLE BKA -  Will start vancomycin to cover MRSA  - Follow-up cultures (blood and wound) - Orthopedic surgery has been consulted. Appreciate their recs - May need MRI to assess for underlying osteomyelitis if does not improve.   ESRD  Chronic Anemia  - Hyperkalemia, HAGMA due to missing HD  - Per nephrology the patient has missed a week of HD  - Plan for HD today   Pancreatic insufficiency  - Continue creon   HTN  - Continue amlodipine and carvedilol   DM - SSI   Diet: NPO  VTE ppx: Heparin  CODE STATUS: Default till full until patient is more alert.   Dispo: Admit patient to Inpatient with expected length of stay greater than 2 midnights.  SignedIna Homes, MD 05/21/2019, 11:36 AM  Pager: [phone]

## 2019-05-21 NOTE — Progress Notes (Signed)
Pharmacy Antibiotic Note  Matthew Kline is a 59 y.o. male admitted on 05/21/2019 with cellulitis. Pharmacy has been consulted for vancomycin dosing. Pt is afebrile and WBC is elevated at 11.8. Pt with history of ESRD on HD with last HD last week.   Plan: Vancomycin 1gm IV x 1 then 750mg  QHD F/u renal plans, C&S, clinical status and pre-HD vanc level PRN  Weight: 62 kg (136 lb 11 oz)(dry weight)  Temp (24hrs), Avg:98.5 F (36.9 C), Min:98.5 F (36.9 C), Max:98.5 F (36.9 C)  Recent Labs  Lab 05/21/19 0739  WBC 11.8*  CREATININE 8.97*    Estimated Creatinine Clearance: 7.7 mL/min (A) (by C-G formula based on SCr of 8.97 mg/dL (H)).    Allergies  Allergen Reactions  . Morphine And Related Other (See Comments)    hallucinations  . Tygacil [Tigecycline] Nausea And Vomiting and Other (See Comments)  . Imodium [Loperamide] Itching    Antimicrobials this admission: Vanc 5/10>> CTX x 1 5/10  Dose adjustments this admission: N/A  Microbiology results: Pending  Thank you for allowing pharmacy to be a part of this patient's care.  Larna Capelle, Rande Lawman 05/21/2019 9:47 AM

## 2019-05-22 ENCOUNTER — Inpatient Hospital Stay (HOSPITAL_COMMUNITY): Payer: Medicare HMO

## 2019-05-22 DIAGNOSIS — L03119 Cellulitis of unspecified part of limb: Secondary | ICD-10-CM

## 2019-05-22 DIAGNOSIS — L02415 Cutaneous abscess of right lower limb: Secondary | ICD-10-CM

## 2019-05-22 DIAGNOSIS — Z992 Dependence on renal dialysis: Secondary | ICD-10-CM

## 2019-05-22 DIAGNOSIS — L02419 Cutaneous abscess of limb, unspecified: Secondary | ICD-10-CM

## 2019-05-22 DIAGNOSIS — E43 Unspecified severe protein-calorie malnutrition: Secondary | ICD-10-CM

## 2019-05-22 DIAGNOSIS — L03115 Cellulitis of right lower limb: Secondary | ICD-10-CM

## 2019-05-22 DIAGNOSIS — I739 Peripheral vascular disease, unspecified: Secondary | ICD-10-CM

## 2019-05-22 DIAGNOSIS — N186 End stage renal disease: Secondary | ICD-10-CM

## 2019-05-22 DIAGNOSIS — T8781 Dehiscence of amputation stump: Secondary | ICD-10-CM

## 2019-05-22 LAB — RENAL FUNCTION PANEL
Albumin: 2.1 g/dL — ABNORMAL LOW (ref 3.5–5.0)
Anion gap: 14 (ref 5–15)
BUN: 21 mg/dL — ABNORMAL HIGH (ref 6–20)
CO2: 26 mmol/L (ref 22–32)
Calcium: 8.6 mg/dL — ABNORMAL LOW (ref 8.9–10.3)
Chloride: 95 mmol/L — ABNORMAL LOW (ref 98–111)
Creatinine, Ser: 4.59 mg/dL — ABNORMAL HIGH (ref 0.61–1.24)
GFR calc Af Amer: 15 mL/min — ABNORMAL LOW (ref >60)
GFR calc non Af Amer: 13 mL/min — ABNORMAL LOW (ref >60)
Glucose, Bld: 80 mg/dL (ref 70–99)
Phosphorus: 4.1 mg/dL (ref 2.5–4.6)
Potassium: 4.2 mmol/L (ref 3.5–5.1)
Sodium: 135 mmol/L (ref 135–145)

## 2019-05-22 LAB — GLUCOSE, CAPILLARY
Glucose-Capillary: 117 mg/dL — ABNORMAL HIGH (ref 70–99)
Glucose-Capillary: 139 mg/dL — ABNORMAL HIGH (ref 70–99)
Glucose-Capillary: 70 mg/dL (ref 70–99)
Glucose-Capillary: 78 mg/dL (ref 70–99)
Glucose-Capillary: 82 mg/dL (ref 70–99)

## 2019-05-22 LAB — CBC
HCT: 32 % — ABNORMAL LOW (ref 39.0–52.0)
Hemoglobin: 10.2 g/dL — ABNORMAL LOW (ref 13.0–17.0)
MCH: 29.7 pg (ref 26.0–34.0)
MCHC: 31.9 g/dL (ref 30.0–36.0)
MCV: 93.3 fL (ref 80.0–100.0)
Platelets: 128 10*3/uL — ABNORMAL LOW (ref 150–400)
RBC: 3.43 MIL/uL — ABNORMAL LOW (ref 4.22–5.81)
RDW: 17.3 % — ABNORMAL HIGH (ref 11.5–15.5)
WBC: 12.9 10*3/uL — ABNORMAL HIGH (ref 4.0–10.5)
nRBC: 0.2 % (ref 0.0–0.2)

## 2019-05-22 LAB — SEDIMENTATION RATE: Sed Rate: 18 mm/hr — ABNORMAL HIGH (ref 0–16)

## 2019-05-22 LAB — C-REACTIVE PROTEIN: CRP: 10.1 mg/dL — ABNORMAL HIGH (ref <1.0)

## 2019-05-22 MED ORDER — LOPERAMIDE HCL 2 MG PO CAPS
2.0000 mg | ORAL_CAPSULE | ORAL | Status: DC | PRN
  Administered 2019-05-22 – 2019-05-28 (×8): 2 mg via ORAL
  Filled 2019-05-22 (×8): qty 1

## 2019-05-22 NOTE — Progress Notes (Addendum)
   Subjective: Pt is a 59-yr-old man with a PMH of ESRD, pancreatic insufficiency, HTN, T2DM, and bilateral LE amputations who presented to the ED with worsening pain, erythema, and purulent discharge at his RLE BKA site. Pt is minimally responsive to questions. Reports mild abdominal pain. Denies fevers and chills  Objective:  Vital signs in last 24 hours: Vitals:   05/21/19 2253 05/22/19 0006 05/22/19 0449 05/22/19 0449  BP: (!) 142/85 (!) 152/91 133/75 133/75  Pulse: 81 85 81 79  Resp: 16 18 18 18   Temp: 98 F (36.7 C) 98.4 F (36.9 C) (!) 97.5 F (36.4 C) (!) 97.5 F (36.4 C)  TempSrc: Oral Oral Oral Oral  SpO2: 96% 92% 93% 93%  Weight: 62.8 kg      General: Pt is in NAD HEENT: [place]/AT Cardiac: Normal S1, S2. RRR, no murmurs, rubs, or gallops Respiratory: Pt is in no respiratory distress GI: Abdomen is soft and non-tender with normoactive bowel sounds Neuro: Pt is not oriented to place or time. Asterixis in both hands, right worse than left Extremities: Right BKA, left AKA for lower extremities Skin: Erythema and ulceration present on surgical incision site of right BKA stump  Assessment/Plan:  Active Problems:   Cellulitis  Pt is a 62-yr-old man with a PMH of ESRD, pancreatic insufficiency, HTN, T2DM, and bilateral LE amputations who presented to the ED with worsening pain, erythema, and purulent discharge at his RLE BKA site. Physical exam findings and elevated WBC consistent with purulent cellulitis.  Purulent cellulitis: Clinical picture is most consistent with purulent cellulitis. Pt is receiving vancomycin to cover MRSA. Wound and blood cultures pending. X-ray does not show osteomyelitis. Orthopedic surgery will evaluate pt to assess need for surgical intervention -IV vancomycin 1-5 g per 200 mL -IV hydromorphone 0.5-1 mg q3h PRN -Follow up on wound and blood cultures -Orthopedic surgery evaluation -Monitor CBC daily  AMS: Pt is not oriented to place or time.  Asterixis noted on exam. Pt has been hospitalized for confusion and AMS previously. Pt was admitted for AMS on 11/24/2018 three days after bilateral inguinal hernia repair. On 01/18/2019, he was again admitted for AMS after dialysis. Medications include sertraline 100 mg QD. Given pt's history of ESRD, poor dialysis compliance, and asterixis on exam, AMS is concerning for hepatic encephalopathy. SSRI use and active infection are also potential etiologies for AMS. -Continue hemodialysis -Monitor for alertness  ESRD  Chronic Anemia: Hemodialysis performed yesterday. Potassium 5.4 on 05/21/2019. Per nephro note, will continue MWF dialysis schedule -MWF dialysis  -Renal function panel  PVD: On X-ray, popliteal artery heavily calcified in right BKA stump. PMH is significant for T2DM, HLD, HTN, ESRD, frequently missed dialysis, and bilateral LE amputation.  Pancreatic insufficiency:  -Creon 72,000 units TID  HTN: -Amlodipine 5 mg QD -Carvedilol 12.5 mg BID  T2DM: -SSI   FEN/GI: -Renal/carb modified diet with fluid restriction -Feeding supplement liquid 30 mL BID  -Multivitamin 1 tablet QD  DVT prophylaxis:  -IV heparin 5000 units q8h SQ  CODE STATUS: FULL  Prior to Admission Living Arrangement: Home Anticipated Discharge Location: Home Barriers to Discharge: None Dispo: Anticipated discharge in greater than [place], Medical Student 05/22/2019, 9:32 AM Pager: [phone]

## 2019-05-22 NOTE — Progress Notes (Signed)
[facility] Progress Note   Subjective:   Patient seen and examined at bedside.  Does not recall having dialysis last night.  He recognizes who I am but can not recall my name.  Denies pain, n/v/d, CP, SOB, and edema.    Objective Vitals:   05/22/19 0006 05/22/19 0449 05/22/19 0449 05/22/19 0815  BP: (!) 152/91 133/75 133/75 130/73  Pulse: 85 81 79 80  Resp: 18 18 18 18   Temp: 98.4 F (36.9 C) (!) 97.5 F (36.4 C) (!) 97.5 F (36.4 C) 97.8 F (36.6 C)  TempSrc: Oral Oral Oral Oral  SpO2: 92% 93% 93% 95%  Weight:       Physical Exam General:chronically ill appearing male in NAD Heart:RRR Lungs:mostly CTAB, BS decreased on L, nml WOB Abdomen:soft, NTND, +BS Extremities:R BKA w/erythema, trace edema & tenderness on base of stump, L AKA no edema/tenderness/erythema Dialysis Access: LU AVF +b  Neuro: oriented to person/place (hospital), +asterixis  Filed Weights   05/21/19 0734 05/21/19 1842 05/21/19 2253  Weight: 62 kg 64.2 kg 62.8 kg    Intake/Output Summary (Last 24 hours) at 05/22/2019 1127 Last data filed at 05/22/2019 1046 Gross per 24 hour  Intake 100 ml  Output 2000 ml  Net -1900 ml    Additional Objective Labs: Basic Metabolic Panel: Recent Labs  Lab 05/21/19 0739 05/22/19 0918  NA 138 135  K 5.4* 4.2  CL 97* 95*  CO2 24 26  GLUCOSE 174* 80  BUN 52* 21*  CREATININE 8.97* 4.59*  CALCIUM 9.2 8.6*  PHOS  --  4.1   Liver Function Tests: Recent Labs  Lab 05/21/19 0739 05/22/19 0918  AST 17  --   ALT 12  --   ALKPHOS 119  --   BILITOT 1.5*  --   PROT 7.5  --   ALBUMIN 2.5* 2.1*   CBC: Recent Labs  Lab 05/21/19 0739 05/22/19 0301  WBC 11.8* 12.9*  NEUTROABS 9.8*  --   HGB 11.2* 10.2*  HCT 35.7* 32.0*  MCV 95.5 93.3  PLT 149* 128*   Blood Culture    Component Value Date/Time   SDES BLOOD RIGHT ARM 05/21/2019 0851   SPECREQUEST  05/21/2019 0851    BOTTLES DRAWN AEROBIC AND ANAEROBIC Blood Culture results may not be  optimal due to an excessive volume of blood received in culture bottles   CULT  05/21/2019 0851    NO GROWTH < 24 HOURS Performed at [facility], [address] [place], [place] [address]    REPTSTATUS PENDING 05/21/2019 0851   CBG: Recent Labs  Lab 05/21/19 1628 05/22/19 0007 05/22/19 0643 05/22/19 1117  GLUCAP 118* 78 82 70    Medications: . [START ON 05/23/2019] vancomycin     . amLODipine  5 mg Oral QPM  . aspirin EC  81 mg Oral QHS  . carvedilol  12.5 mg Oral BID WC  . Chlorhexidine Gluconate Cloth  6 each Topical Q0600  . darbepoetin (ARANESP) injection - DIALYSIS  60 mcg Intravenous Q Mon-HD  . feeding supplement (PRO-STAT SUGAR FREE 64)  30 mL Oral BID  . gabapentin  200 mg Oral Daily  . heparin  5,000 Units Subcutaneous Q8H  . insulin aspart  0-5 Units Subcutaneous QHS  . insulin aspart  0-6 Units Subcutaneous TID WC  . lipase/protease/amylase  72,000 Units Oral TID with meals  . multivitamin  1 tablet Oral QHS  . sertraline  100 mg Oral Daily  . sevelamer  carbonate  2,400 mg Oral TID WC  . temazepam  30 mg Oral QHS    Dialysis Orders: [facility]  on MWF. 180Nre, BFR 400, DFR Auto 1.5, EDW 63.5kg, 2K/2Ca Heparin 4000 unit bolus Venofer 100mg  IV q HD- received 5/10 doses Mircera 100 mcg q 2 weeks- last dose 4/23  Assessment/Plan: 1.  R stump cellulitis: ETK^24.9. Wound culture and BC pending - NGTD. R tib/fib Xray ordered. On vancomycin. Management per primary. 2. AMS - likely d/t uremia + infection + meds. Continue HD per regular schedule. 3.  ESRD:  on HD MWF.  Missed 1 week of HD. K4.2, BUN 21, +asterixis.  HD yesterday tolerated well.  No urgent indication for HD today.  Orders written for HD tomorrow per regular schedule.  4.  Hypertension/volume: BP well controlled, euvolemic on exam, under EDW by weights.  Titrate down volume as tolerated.  Will need new EDW on d/c.  Plan for UF goal 1-2L tomorrow.  5.  Anemia: Hgb 10.2.  Aranesp 78mcg qMon. Holding IV Fe in setting of acute infection. Patient denies any recent blood loss. 6.  Metabolic bone disease: CorrCa 10.1. Not on VDRA. Phos at goal. Continue renvela.   7.  Nutrition:  Renal diet/fluid restrictions. On protein supplements as outpatient- will order prostat.  Jen Mow, PA-C [facility] Pager: [phone] 05/22/2019,11:27 AM  LOS: 1 day

## 2019-05-22 NOTE — Consult Note (Signed)
ORTHOPAEDIC CONSULTATION  REQUESTING PHYSICIAN: Velna Ochs, MD  Chief Complaint: Pain and purulent drainage right transtibial amputation.  HPI: Matthew Kline is a 59 y.o. male who presents with diabetic insensate neuropathy severe peripheral vascular disease end-stage renal disease on dialysis with severe protein caloric malnutrition.  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  Patient reports a several week acute history of increasing pain and drainage from the right below the knee amputation.  Past Medical History:  Diagnosis Date  . Anemia, unspecified   . Anxiety   . Arthralgia 2010   polyarticular  . Arthritis    "back, knees" (01/10/2017)  . [facility])    "kidney area" (01/10/2017)  . CHF (congestive heart failure) ([place]) 07/25/2009   denies  . Chronic lower back pain   . Coronary artery disease   . Coughing    pt. reports that he has drainage from sinus infection  . Diabetic foot ulcer ([place])   . Diabetic neuropathy ([place])   . ESRD (end stage renal disease) on dialysis [facility])    started 12/2012; "MWF; [place] "  (01/10/2017)  . GERD (gastroesophageal reflux disease)    hx "before I lost weight", no problem 9 years  . Hemodialysis access site with mature fistula ([place])   . Hemorrhoids, internal 10/2011   small  . High cholesterol   . History of blood transfusion    "related to the anemia"  . Hypertension   . Insomnia, unspecified   . Lacunar infarction ([place]) 2006   RUE/RLE, speech  . Long term (current) use of anticoagulants   . Myocardial infarction ([place]) 1995  . Orthostatic hypotension   . Osteomyelitis of foot, left, acute ([place])   . Other chronic postoperative pain   . Pneumonia    "probably twice" (01/10/2017)  . Polymyalgia rheumatica ([place])   . Renal insufficiency   . Sleep apnea    "lost weight; no more problem" (01/10/2017)  . Stroke ([place]) 01/10/06   denies residual on 05/09/2014    . Type II diabetes mellitus ([place]) dx'd 1995  . Unspecified hereditary and idiopathic peripheral neuropathy    feet  . Unspecified osteomyelitis, site unspecified   . Unspecified vitamin D deficiency    Past Surgical History:  Procedure Laterality Date  . ABDOMINAL AORTOGRAM N/A 08/25/2016   Procedure: ABDOMINAL AORTOGRAM;  Surgeon: Wellington Hampshire, MD;  Location: [place] [facility];  Service: Cardiovascular;  Laterality: N/A;  . AMPUTATION  01/21/2012   Procedure: AMPUTATION RAY;  Surgeon: [name], MD;  Location: [place];  Service: Orthopedics;  Laterality: Left;  Left Foot 4th Ray Amputation  . AMPUTATION Left 05/04/2013   Procedure: AMPUTATION DIGIT;  Surgeon: [name], MD;  Location: [place];  Service: Orthopedics;  Laterality: Left;  Left Great Toe Amputation at [facility]  . AMPUTATION Left 01/14/2017   Procedure: AMPUTATION ABOVE LEFT KNEE;  Surgeon: [name], MD;  Location: [place];  Service: Orthopedics;  Laterality: Left;  . AMPUTATION Right 12/31/2018   Procedure: AMPUTATION BELOW KNEE;  Surgeon: [name], MD;  Location: [place];  Service: Orthopedics;  Laterality: Right;  . ANTERIOR CERVICAL DECOMP/DISCECTOMY FUSION  02/2011  . APPLICATION OF WOUND VAC Right 12/31/2018   Procedure: Application Of Wound Vac;  Surgeon: [name], MD;  Location: [place];  Service: Orthopedics;  Laterality: Right;  . BACK SURGERY    . BASCILIC VEIN TRANSPOSITION Left 10/19/2012  Procedure: BASCILIC VEIN TRANSPOSITION;  Surgeon: Serafina Mitchell, MD;  Location: [place];  Service: Vascular;  Laterality: Left;  . CARDIAC CATHETERIZATION     "before bypass"  . CHOLECYSTECTOMY N/A 08/02/2018   Procedure: LAPAROSCOPIC CHOLECYSTECTOMY WITH INTRAOPERATIVE CHOLANGIOGRAM;  Surgeon: Donnie Mesa, MD;  Location: [place];  Service: General;  Laterality: N/A;  . CORONARY ARTERY BYPASS GRAFT     x 5 with lima at [place] SPACERS Left 08/07/2014    Procedure: Replace Left Total Knee Arthroplasty,  Place Antibiotic Spacer;  Surgeon: [name], MD;  Location: [place];  Service: Orthopedics;  Laterality: Left;  . I & D EXTREMITY Left 05/09/2014   Procedure: Irrigation and Debridement Left Knee and Closure of Total Knee Arthroplasty Incision;  Surgeon: [name], MD;  Location: [place];  Service: Orthopedics;  Laterality: Left;  . I & D KNEE WITH POLY EXCHANGE Left 05/31/2014   Procedure: IRRIGATION AND DEBRIDEMENT LEFT KNEE, PLACE ANTIBIOTIC BEADS,  POLY EXCHANGE;  Surgeon: [name], MD;  Location: [place];  Service: Orthopedics;  Laterality: Left;  . INGUINAL HERNIA REPAIR Bilateral 11/21/2018   Procedure: BILATERAL INGUINAL HERNIA REPAIR WITH MESH;  Surgeon: Coralie Keens, MD;  Location: [place];  Service: General;  Laterality: Bilateral;  . IRRIGATION AND DEBRIDEMENT KNEE Left 01/12/2017   Procedure: IRRIGATION AND DEBRIDEMENT LEFT KNEE;  Surgeon: [name], MD;  Location: [place];  Service: Orthopedics;  Laterality: Left;  . JOINT REPLACEMENT    . KNEE ARTHROSCOPY Left 08-25-2012  . LOWER EXTREMITY ANGIOGRAPHY Left 08/25/2016   Procedure: Lower Extremity Angiography;  Surgeon: Wellington Hampshire, MD;  Location: [place] [facility];  Service: Cardiovascular;  Laterality: Left;  . PERIPHERAL VASCULAR BALLOON ANGIOPLASTY Left 08/25/2016   Procedure: PERIPHERAL VASCULAR BALLOON ANGIOPLASTY;  Surgeon: Wellington Hampshire, MD;  Location: [place] [facility];  Service: Cardiovascular;  Laterality: Left;  lt peroneal and ant tibial arteries cutting balloon  . REFRACTIVE SURGERY Bilateral   . TOE AMPUTATION Bilateral    "I've lost 7 toes over the last 7 years" (05/09/2014)  . TOE SURGERY Left April 2015   Big toe removed on left foot.  . TONSILLECTOMY    . TOTAL KNEE ARTHROPLASTY Left 04/10/2014   Procedure: TOTAL KNEE ARTHROPLASTY;  Surgeon: [name], MD;  Location: [place];  Service: Orthopedics;  Laterality: Left;  . TOTAL KNEE REVISION  Left 10/25/2014   Procedure: LEFT TOTAL KNEE REVISION;  Surgeon: [name], MD;  Location: [place];  Service: Orthopedics;  Laterality: Left;  . TOTAL KNEE REVISION Left 11/26/2015   Procedure: Removal Left Total Knee Arthroplasty, Hinged Total Knee Arthroplasty;  Surgeon: [name], MD;  Location: [place];  Service: Orthopedics;  Laterality: Left;  . UVULOPALATOPHARYNGOPLASTY, TONSILLECTOMY AND SEPTOPLASTY  ~ 1989  . WOUND DEBRIDEMENT Left 05/09/2014   Dehiscence Left Total Knee Arthroplasty Incision   Social History   Socioeconomic History  . Marital status: Legally Separated    Spouse name: Not on file  . Number of children: Not on file  . Years of education: Not on file  . Highest education level: Not on file  Occupational History  . Not on file  Tobacco Use  . Smoking status: Former Smoker    Packs/day: 0.12    Years: 32.00    Pack years: 3.84    Types: Cigarettes    Quit date: 01/01/2019    Years since  quitting: 0.3  . Smokeless tobacco: Never Used  . Tobacco comment: 1 per week  Substance and Sexual Activity  . Alcohol use: No    Alcohol/week: 0.0 standard drinks  . Drug use: No  . Sexual activity: Not Currently  Other Topics Concern  . Not on file  Social History Narrative  . Not on file   Social Determinants of Health   Financial Resource Strain:   . Difficulty of Paying Living Expenses:   Food Insecurity:   . Worried About Charity fundraiser in the Last Year:   . Arboriculturist in the Last Year:   Transportation Needs:   . Film/video editor (Medical):   Marland Kitchen Lack of Transportation (Non-Medical):   Physical Activity:   . Days of Exercise per Week:   . Minutes of Exercise per Session:   Stress:   . Feeling of Stress :   Social Connections:   . Frequency of Communication with Friends and Family:   . Frequency of Social Gatherings with Friends and Family:   . Attends Religious Services:   . Active Member of Clubs or Organizations:   . Attends  Archivist Meetings:   Marland Kitchen Marital Status:    Family History  Problem Relation Age of Onset  . Hypertension Mother   . Cancer Mother 45       Ovarian  . Heart disease Maternal Aunt   . Stroke Maternal Grandfather    - negative except otherwise stated in the family history section Allergies  Allergen Reactions  . Morphine And Related Other (See Comments)    hallucinations  . Tygacil [Tigecycline] Nausea And Vomiting and Other (See Comments)  . Imodium [Loperamide] Itching   Prior to Admission medications   Medication Sig Start Date End Date Taking? Authorizing Provider  acetaminophen (TYLENOL) 500 MG tablet Take 500-1,000 mg by mouth every 6 (six) hours as needed for mild pain or fever.    Yes [provider]  Amino Acids-Protein Hydrolys (FEEDING SUPPLEMENT, PRO-STAT SUGAR FREE 64,) LIQD Take 30 mLs by mouth 2 (two) times daily. 01/05/19  Yes Debbe Odea, MD  amLODipine (NORVASC) 5 MG tablet Take 5 mg by mouth every evening.    Yes [provider]  aspirin EC 81 MG EC tablet Take 2 tablets (162 mg total) by mouth daily. Patient taking differently: Take 81 mg by mouth at bedtime.  01/20/17  Yes Patrecia Pour, Christean Grief, MD  carvedilol (COREG) 12.5 MG tablet Take 1 tablet (12.5 mg total) by mouth 2 (two) times daily with a meal. 02/08/17  Yes Bonnell Public, MD  CREON [phone] units CPEP capsule Take 1-2 capsules by mouth See admin instructions. Take 2 capsules by mouth with meals and 1 capsule before snacks 03/20/19  Yes [provider]  Darbepoetin Alfa (ARANESP) 150 MCG/0.3ML SOSY injection Inject 0.3 mLs (150 mcg total) into the vein every Friday with hemodialysis. 02/11/17  Yes Dana Allan I, MD  gabapentin (NEURONTIN) 100 MG capsule Take 200 mg by mouth daily.  09/27/18  Yes [provider]  heparin 1000 UNIT/ML injection Inject 4 mLs into the vein See admin instructions. Every hemodialysis treatment 08/14/18 08/13/19 Yes [provider]  hydrOXYzine (ATARAX/VISTARIL) 50 MG tablet Take 50 mg by mouth 3 (three) times daily as needed for itching.    Yes [provider]  methocarbamol (ROBAXIN) 500 MG tablet Take 1 tablet (500 mg total) by mouth every 6 (six) hours as needed for muscle  spasms. 01/05/19  Yes Debbe Odea, MD  montelukast (SINGULAIR) 10 MG tablet Take 10 mg by mouth at bedtime as needed (allergies).  08/14/18  Yes [provider]  multivitamin (RENA-VIT) TABS tablet Take 1 tablet by mouth at bedtime. 02/08/17  Yes Dana Allan I, MD  sertraline (ZOLOFT) 100 MG tablet Take 100 mg by mouth daily. 04/18/19  Yes [provider]  temazepam (RESTORIL) 30 MG capsule Take 30 mg by mouth at bedtime.    Yes [provider]  bisacodyl (DULCOLAX) 10 MG suppository Place 1 suppository (10 mg total) rectally daily as needed for moderate constipation. Patient not taking: Reported on 05/21/2019 01/05/19   Debbe Odea, MD  docusate sodium (COLACE) 100 MG capsule Take 1 capsule (100 mg total) by mouth 2 (two) times daily. Patient not taking: Reported on 05/21/2019 01/05/19   Debbe Odea, MD  menthol-cetylpyridinium (CEPACOL) 3 MG lozenge Take 1 lozenge (3 mg total) by mouth as needed for sore throat. Patient not taking: Reported on 05/21/2019 01/05/19   Debbe Odea, MD  oxyCODONE (OXY IR/ROXICODONE) 5 MG immediate release tablet Take 1 tablet (5 mg total) by mouth every 6 (six) hours as needed for moderate pain, severe pain or breakthrough pain. Patient not taking: Reported on 05/21/2019 01/25/19   Persons, Bevely Palmer, PA  polyethylene glycol (MIRALAX / GLYCOLAX) 17 g packet Take 17 g by mouth daily as needed for mild constipation. Patient not taking: Reported on 05/21/2019 01/05/19   Debbe Odea, MD   No results found. - pertinent xrays, CT, MRI studies were reviewed and independently interpreted  Positive ROS: All other systems have been reviewed and were otherwise negative with  the exception of those mentioned in the HPI and as above.  Physical Exam: General: Alert, no acute distress Psychiatric: Patient is competent for consent with normal mood and affect Lymphatic: No axillary or cervical lymphadenopathy Cardiovascular: No pedal edema Respiratory: No cyanosis, no use of accessory musculature GI: No organomegaly, abdomen is soft and non-tender    Images:  @ENCIMAGES @  Labs:  Lab Results  Component Value Date   HGBA1C 5.5 05/21/2019   HGBA1C 5.7 (H) 10/03/2018   HGBA1C 5.7 (H) 08/01/2018   ESRSEDRATE 57 (H) 12/30/2018   ESRSEDRATE 80 (H) 11/29/2015   ESRSEDRATE 75 (H) 08/07/2014   CRP 10.1 (H) 05/22/2019   CRP 7.6 (H) 12/30/2018   CRP 9.8 (H) 07/14/2016   REPTSTATUS PENDING 05/21/2019   GRAMSTAIN  05/21/2019    NO WBC SEEN RARE GRAM POSITIVE COCCI IN PAIRS Performed at [facility], [place] [address], [place], [place] [address]    CULT  05/21/2019    NO GROWTH < 24 HOURS Performed at [facility] [address], [place], [place] [address]    [place] 01/12/2017    Lab Results  Component Value Date   ALBUMIN 2.1 (L) 05/22/2019   ALBUMIN 2.5 (L) 05/21/2019   ALBUMIN 2.8 (L) 01/01/2019   PREALBUMIN 13.3 (L) 12/30/2018    Neurologic: Patient does not have protective sensation bilateral lower extremities.   MUSCULOSKELETAL:   Skin: Examination there is a few scabs along the surgical incision there is no significant cellulitis however the residual limb is extremely tender to palpation.  There is no drainage at this time however there are reports of purulent drainage.  The left above-the-knee amputation is well-healed and nontender to palpation.  2 view radiographs of the right knee shows severe calcified vessels with no lytic bony changes no  radiographic evidence of an abscess.  Assessment: Assessment: Diabetic insensate neuropathy end-stage renal disease on dialysis with severe peripheral vascular  disease severe protein caloric malnutrition with a left above-the-knee amputation and right transtibial amputation with pain and drainage from the right transtibial amputation.  Plan: Plan: Will see how the patient does over the next few days with IV antibiotics.  Discussed that if we do not show any improvement with IV antibiotics we would need to proceed with revision amputation surgery.  Plan for surgery on Friday if we do not have any clinical improvement with IV antibiotics.  Thank you for the consult and the opportunity to see Mr. Anthonie Lotito, [place] [phone] 1:09 PM

## 2019-05-22 NOTE — Progress Notes (Signed)
PT Cancellation Note  Patient Details Name: Matthew Kline MRN: [id number] DOB: December 21, 1960   Cancelled Treatment:    Reason Eval/Treat Not Completed: Other (comment). Patient was getting cleaned up by nursing staff. Will return at later day/time for assessment.     Rick Warnick 05/22/2019, 2:10 PM

## 2019-05-22 NOTE — Plan of Care (Signed)
  Problem: Pain Managment: Goal: General experience of comfort will improve Outcome: Progressing   

## 2019-05-23 ENCOUNTER — Encounter (HOSPITAL_COMMUNITY)
Admission: EM | Disposition: A | Discharge status: Skilled Nursing Facility | Payer: Self-pay | Source: Home / Self Care | Attending: Internal Medicine

## 2019-05-23 ENCOUNTER — Inpatient Hospital Stay (HOSPITAL_COMMUNITY): Payer: Medicare HMO | Admitting: Anesthesiology

## 2019-05-23 ENCOUNTER — Encounter (HOSPITAL_COMMUNITY): Payer: Self-pay | Admitting: Internal Medicine

## 2019-05-23 ENCOUNTER — Other Ambulatory Visit: Payer: Self-pay | Admitting: Physician Assistant

## 2019-05-23 DIAGNOSIS — R410 Disorientation, unspecified: Secondary | ICD-10-CM

## 2019-05-23 HISTORY — PX: STUMP REVISION: SHX6102

## 2019-05-23 LAB — CBC WITH DIFFERENTIAL/PLATELET
Abs Immature Granulocytes: 0.05 10*3/uL (ref 0.00–0.07)
Basophils Absolute: 0 10*3/uL (ref 0.0–0.1)
Basophils Relative: 0 %
Eosinophils Absolute: 0.1 10*3/uL (ref 0.0–0.5)
Eosinophils Relative: 0 %
HCT: 31.5 % — ABNORMAL LOW (ref 39.0–52.0)
Hemoglobin: 10.1 g/dL — ABNORMAL LOW (ref 13.0–17.0)
Immature Granulocytes: 0 %
Lymphocytes Relative: 5 %
Lymphs Abs: 0.6 10*3/uL — ABNORMAL LOW (ref 0.7–4.0)
MCH: 29.8 pg (ref 26.0–34.0)
MCHC: 32.1 g/dL (ref 30.0–36.0)
MCV: 92.9 fL (ref 80.0–100.0)
Monocytes Absolute: 0.8 10*3/uL (ref 0.1–1.0)
Monocytes Relative: 7 %
Neutro Abs: 10.7 10*3/uL — ABNORMAL HIGH (ref 1.7–7.7)
Neutrophils Relative %: 88 %
Platelets: 117 10*3/uL — ABNORMAL LOW (ref 150–400)
RBC: 3.39 MIL/uL — ABNORMAL LOW (ref 4.22–5.81)
RDW: 17.1 % — ABNORMAL HIGH (ref 11.5–15.5)
WBC: 12.3 10*3/uL — ABNORMAL HIGH (ref 4.0–10.5)
nRBC: 0.2 % (ref 0.0–0.2)

## 2019-05-23 LAB — RENAL FUNCTION PANEL
Albumin: 2.1 g/dL — ABNORMAL LOW (ref 3.5–5.0)
Anion gap: 15 (ref 5–15)
BUN: 30 mg/dL — ABNORMAL HIGH (ref 6–20)
CO2: 26 mmol/L (ref 22–32)
Calcium: 9 mg/dL (ref 8.9–10.3)
Chloride: 95 mmol/L — ABNORMAL LOW (ref 98–111)
Creatinine, Ser: 5.56 mg/dL — ABNORMAL HIGH (ref 0.61–1.24)
GFR calc Af Amer: 12 mL/min — ABNORMAL LOW (ref >60)
GFR calc non Af Amer: 10 mL/min — ABNORMAL LOW (ref >60)
Glucose, Bld: 96 mg/dL (ref 70–99)
Phosphorus: 3.9 mg/dL (ref 2.5–4.6)
Potassium: 4 mmol/L (ref 3.5–5.1)
Sodium: 136 mmol/L (ref 135–145)

## 2019-05-23 LAB — SURGICAL PCR SCREEN
MRSA, PCR: NEGATIVE
Staphylococcus aureus: POSITIVE — AB

## 2019-05-23 LAB — AEROBIC CULTURE W GRAM STAIN (SUPERFICIAL SPECIMEN): Gram Stain: NONE SEEN

## 2019-05-23 LAB — GLUCOSE, CAPILLARY
Glucose-Capillary: 86 mg/dL (ref 70–99)
Glucose-Capillary: 156 mg/dL — ABNORMAL HIGH (ref 70–99)
Glucose-Capillary: 101 mg/dL — ABNORMAL HIGH (ref 70–99)
Glucose-Capillary: 92 mg/dL (ref 70–99)

## 2019-05-23 SURGERY — REVISION, AMPUTATION SITE
Anesthesia: General | Site: Leg Lower | Laterality: Right

## 2019-05-23 MED ORDER — DOCUSATE SODIUM 100 MG PO CAPS
100.0000 mg | ORAL_CAPSULE | Freq: Two times a day (BID) | ORAL | Status: DC
  Administered 2019-05-24 – 2019-05-25 (×2): 100 mg via ORAL
  Filled 2019-05-23 (×7): qty 1

## 2019-05-23 MED ORDER — DARBEPOETIN ALFA 60 MCG/0.3ML IJ SOSY
60.0000 ug | PREFILLED_SYRINGE | INTRAMUSCULAR | Status: DC
  Filled 2019-05-23: qty 0.3

## 2019-05-23 MED ORDER — FENTANYL CITRATE (PF) 100 MCG/2ML IJ SOLN
25.0000 ug | INTRAMUSCULAR | Status: DC | PRN
  Administered 2019-05-23: 25 ug via INTRAVENOUS
  Administered 2019-05-23: 50 ug via INTRAVENOUS
  Administered 2019-05-23: 25 ug via INTRAVENOUS

## 2019-05-23 MED ORDER — OXYCODONE HCL 5 MG/5ML PO SOLN: 5.0000 mg | Freq: Once | ORAL | Status: DC | PRN

## 2019-05-23 MED ORDER — LIDOCAINE 2% (20 MG/ML) 5 ML SYRINGE
INTRAMUSCULAR | Status: DC | PRN
  Administered 2019-05-23: 40 mg via INTRAVENOUS

## 2019-05-23 MED ORDER — HYDROMORPHONE HCL 1 MG/ML IJ SOLN
INTRAMUSCULAR | Status: AC
  Filled 2019-05-23: qty 1

## 2019-05-23 MED ORDER — SODIUM CHLORIDE 0.9 % IV SOLN: INTRAVENOUS | Status: DC

## 2019-05-23 MED ORDER — HYDROMORPHONE HCL 1 MG/ML IJ SOLN
0.2500 mg | INTRAMUSCULAR | Status: DC | PRN
  Administered 2019-05-23 (×2): 0.5 mg via INTRAVENOUS

## 2019-05-23 MED ORDER — ENSURE PRE-SURGERY PO LIQD
296.0000 mL | Freq: Once | ORAL | Status: AC
  Administered 2019-05-23: 296 mL via ORAL
  Filled 2019-05-23: qty 296

## 2019-05-23 MED ORDER — PHENYLEPHRINE 40 MCG/ML (10ML) SYRINGE FOR IV PUSH (FOR BLOOD PRESSURE SUPPORT)
PREFILLED_SYRINGE | INTRAVENOUS | Status: DC | PRN
  Administered 2019-05-23: 80 ug via INTRAVENOUS

## 2019-05-23 MED ORDER — PROPOFOL 10 MG/ML IV BOLUS
INTRAVENOUS | Status: DC | PRN
  Administered 2019-05-23: 50 mg via INTRAVENOUS

## 2019-05-23 MED ORDER — OXYCODONE HCL 5 MG PO TABS
5.0000 mg | ORAL_TABLET | ORAL | Status: DC | PRN
  Administered 2019-05-24: 5 mg via ORAL
  Administered 2019-05-25 – 2019-05-29 (×6): 10 mg via ORAL
  Filled 2019-05-23 (×2): qty 2
  Filled 2019-05-23: qty 1
  Filled 2019-05-23 (×4): qty 2

## 2019-05-23 MED ORDER — OXYCODONE HCL 5 MG PO TABS: 5.0000 mg | ORAL_TABLET | Freq: Once | ORAL | Status: DC | PRN

## 2019-05-23 MED ORDER — 0.9 % SODIUM CHLORIDE (POUR BTL) OPTIME
TOPICAL | Status: DC | PRN
  Administered 2019-05-23: 16:00:00 1000 mL

## 2019-05-23 MED ORDER — CEFAZOLIN SODIUM-DEXTROSE 2-4 GM/100ML-% IV SOLN
2.0000 g | INTRAVENOUS | Status: AC
  Administered 2019-05-23: 16:00:00 2 g via INTRAVENOUS
  Filled 2019-05-23: qty 100

## 2019-05-23 MED ORDER — FENTANYL CITRATE (PF) 100 MCG/2ML IJ SOLN
INTRAMUSCULAR | Status: AC
  Filled 2019-05-23: qty 2

## 2019-05-23 SURGICAL SUPPLY — 33 items
BLADE SAW RECIP 87.9 MT (BLADE) ×3 IMPLANT
BLADE SURG 21 STRL SS (BLADE) ×3 IMPLANT
BNDG COHESIVE 6X5 TAN NS LF (GAUZE/BANDAGES/DRESSINGS) ×3 IMPLANT
BNDG GAUZE ELAST 4 BULKY (GAUZE/BANDAGES/DRESSINGS) ×3 IMPLANT
CANISTER WOUND CARE 500ML ATS (WOUND CARE) IMPLANT
COVER SURGICAL LIGHT HANDLE (MISCELLANEOUS) ×3 IMPLANT
COVER WAND RF STERILE (DRAPES) ×3 IMPLANT
DRAPE EXTREMITY T 121X128X90 (DISPOSABLE) ×3 IMPLANT
DRAPE HALF SHEET 40X57 (DRAPES) ×3 IMPLANT
DRAPE INCISE IOBAN 66X45 STRL (DRAPES) ×3 IMPLANT
DRAPE U-SHAPE 47X51 STRL (DRAPES) ×6 IMPLANT
DRESSING PREVENA PLUS CUSTOM (GAUZE/BANDAGES/DRESSINGS) IMPLANT
DRSG PREVENA PLUS CUSTOM (GAUZE/BANDAGES/DRESSINGS)
DURAPREP 26ML APPLICATOR (WOUND CARE) ×3 IMPLANT
ELECT REM PT RETURN 9FT ADLT (ELECTROSURGICAL) ×3
ELECTRODE REM PT RTRN 9FT ADLT (ELECTROSURGICAL) ×1 IMPLANT
GLOVE BIOGEL PI IND STRL 9 (GLOVE) ×1 IMPLANT
GLOVE BIOGEL PI INDICATOR 9 (GLOVE) ×2
GLOVE SURG ORTHO 9.0 STRL STRW (GLOVE) ×3 IMPLANT
GOWN STRL REUS W/ TWL XL LVL3 (GOWN DISPOSABLE) ×2 IMPLANT
GOWN STRL REUS W/TWL XL LVL3 (GOWN DISPOSABLE) ×4
KIT BASIN OR (CUSTOM PROCEDURE TRAY) ×3 IMPLANT
KIT TURNOVER KIT B (KITS) ×3 IMPLANT
MANIFOLD NEPTUNE II (INSTRUMENTS) ×3 IMPLANT
NS IRRIG 1000ML POUR BTL (IV SOLUTION) ×3 IMPLANT
PACK GENERAL/GYN (CUSTOM PROCEDURE TRAY) ×3 IMPLANT
PAD ARMBOARD 7.5X6 YLW CONV (MISCELLANEOUS) ×3 IMPLANT
PREVENA RESTOR ARTHOFORM 46X30 (CANNISTER) IMPLANT
STAPLER VISISTAT 35W (STAPLE) IMPLANT
SUT ETHILON 2 0 PSLX (SUTURE) IMPLANT
SUT SILK 2 0 (SUTURE)
SUT SILK 2-0 18XBRD TIE 12 (SUTURE) IMPLANT
TOWEL GREEN STERILE (TOWEL DISPOSABLE) ×3 IMPLANT

## 2019-05-23 NOTE — Evaluation (Signed)
Physical Therapy Evaluation Patient Details Name: Matthew Kline MRN: [id number] DOB: 1960/12/04 Today's Date: 05/23/2019   History of Present Illness  Matthew Kline is a 59 y.o. male who presents with diabetic insensate neuropathy severe peripheral vascular disease end-stage renal disease on dialysis with severe protein caloric malnutrition.  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  Patient reports a several week acute history of increasing pain and drainage from the right below the knee amputation. Pain and purulent drainage right transtibial amputation. Pt to have surgical revision of L LE 05/23/19.  Clinical Impression   Pt admitted with above diagnosis. Pt with impaired cognition and inability to offer detailed information about PLOF and home set up, but did say he lived alone in ground level apartment. Pt had soiled bed and unaware. Assisted pt to roll for pericare, linen change and placement of sacral foam. Required 2 person assist for bed mobility and total assist for ADL. Pt to have surgery this afternoon. Will follow acutely. Recommending SNF for further rehab prior to return home.  Pt currently with functional limitations due to the deficits listed below (see PT Problem List). Pt will benefit from skilled PT to increase their independence and safety with mobility to allow discharge to the venue listed below.       Follow Up Recommendations SNF    Equipment Recommendations  Other (comment)(to be determined)    Recommendations for Other Services       Precautions / Restrictions Precautions Precautions: Fall Restrictions Weight Bearing Restrictions: Yes RLE Weight Bearing: Non weight bearing      Mobility  Bed Mobility Overal bed mobility: Needs Assistance Bed Mobility: Rolling Rolling: +2 for physical assistance;Mod assist;Max assist         General bed mobility comments: +2 mod to R, +2 max to L side, able to  maintain sidelying for pericare once placed  Transfers                 General transfer comment: deferred  Ambulation/Gait                Stairs            Wheelchair Mobility    Modified Rankin (Stroke Patients Only)       Balance                                             Pertinent Vitals/Pain Pain Assessment: Faces Faces Pain Scale: Hurts whole lot Pain Location: R LE Pain Descriptors / Indicators: Grimacing;Guarding;Moaning Pain Intervention(s): Monitored during session    Home Living Family/patient expects to be discharged to:: Private residence Living Arrangements: Alone   Type of Home: Apartment Home Access: Level entry     Home Layout: One level Home Equipment: Environmental consultant - 2 wheels;Wheelchair - manual;Tub bench(L LE prosthetic) Additional Comments: Pt unable to answer questions and engage re: planning; above information gleaned from his report, and chart review    Prior Function Level of Independence: Independent with assistive device(s)         Comments: pt with inconsistent ability to offer information about home set up and PLOF.     Hand Dominance   Dominant Hand: Right    Extremity/Trunk Assessment   Upper Extremity Assessment Upper Extremity Assessment: Generalized weakness    Lower Extremity Assessment Lower Extremity Assessment: RLE deficits/detail;LLE deficits/detail  RLE Deficits / Details: R LE with previous BKA; moves hip and knee spontaneously, very painful RLE: Unable to fully assess due to pain LLE Deficits / Details: Previous AKA       Communication   Communication: No difficulties  Cognition Arousal/Alertness: Lethargic(with brief periods of alertness) Behavior During Therapy: Flat affect Overall Cognitive Status: Impaired/Different from baseline Area of Impairment: Orientation;Attention;Following commands;Awareness;Problem solving                 Orientation Level: Disoriented  to;Situation Current Attention Level: Focused   Following Commands: Follows one step commands inconsistently;Follows one step commands with increased time(and multimodal cues)   Awareness: Intellectual Problem Solving: Slow processing;Decreased initiation;Difficulty sequencing;Requires verbal cues;Requires tactile cues        General Comments General comments (skin integrity, edema, etc.): Incontinent of bowel upon arrival; assisted with hygeine; noted redness and discomfort perineal area, perianal area, and scrotum; applied barrier cream with assist from [place], RN    Exercises     Assessment/Plan    PT Assessment Patient needs continued PT services  PT Problem List Decreased strength;Decreased range of motion;Decreased activity tolerance;Decreased balance;Decreased mobility;Decreased coordination;Decreased cognition;Decreased knowledge of use of DME;Decreased safety awareness;Pain;Impaired sensation;Decreased skin integrity       PT Treatment Interventions DME instruction;Gait training;Functional mobility training;Therapeutic activities;Therapeutic exercise;Balance training;Neuromuscular re-education;Cognitive remediation;Patient/family education;Wheelchair mobility training    PT Goals (Current goals can be found in the Care Plan section)  Acute Rehab PT Goals Patient Stated Goal: pt unable to state PT Goal Formulation: Patient unable to participate in goal setting Time For Goal Achievement: 06/06/19 Potential to Achieve Goals: Fair    Frequency Min 2X/week   Barriers to discharge        Co-evaluation PT/OT/SLP Co-Evaluation/Treatment: Yes Reason for Co-Treatment: Complexity of the patient's impairments (multi-system involvement);For patient/therapist safety PT goals addressed during session: Mobility/safety with mobility         AM-PAC PT "6 Clicks" Mobility  Outcome Measure Help needed turning from your back to your side while in a flat bed without using  bedrails?: A Lot Help needed moving from lying on your back to sitting on the side of a flat bed without using bedrails?: Total Help needed moving to and from a bed to a chair (including a wheelchair)?: Total Help needed standing up from a chair using your arms (e.g., wheelchair or bedside chair)?: Total Help needed to walk in hospital room?: Total Help needed climbing 3-5 steps with a railing? : Total 6 Click Score: 7    End of Session Equipment Utilized During Treatment: Other (comment)(cream, bed pad) Activity Tolerance: Patient limited by pain Patient left: in bed;with call bell/phone within reach;with bed alarm set;with nursing/sitter in room Nurse Communication: Mobility status PT Visit Diagnosis: Other abnormalities of gait and mobility (R26.89);Pain Pain - Right/Left: Right Pain - part of body: Leg    Time: 5170-0174 PT Time Calculation (min) (ACUTE ONLY): 20 min   Charges:   PT Evaluation $PT Eval Moderate Complexity: 1 Mod          Roney Marion, [place]  Acute Rehabilitation Services Pager [phone] Office [phone]   [place] 05/23/2019, 1:53 PM

## 2019-05-23 NOTE — Plan of Care (Signed)
  Problem: Clinical Measurements: Goal: Ability to avoid or minimize complications of infection will improve Outcome: Progressing   

## 2019-05-23 NOTE — Progress Notes (Signed)
   Subjective: Pt is a 59-yr-old man with a PMH of ESRD, pancreatic insufficiency, HTN, T2DM, and bilateral LE amputations who presented to the ED withworsening pain, erythema, and purulent discharge at his RLE BKA site. Pt is minimally responsive to questions. He is not oriented to place or time. Cannot say where he is or what year it is. Reports mild abdominal pain. Denies fevers and chills  Objective:  Vital signs in last 24 hours: Vitals:   05/22/19 1608 05/22/19 2051 05/23/19 0452 05/23/19 0452  BP: 126/71 114/74 125/81 125/81  Pulse: 77 71 78 77  Resp: 16 19 20 20   Temp: 99.3 F (37.4 C) 99 F (37.2 C) 98.2 F (36.8 C) 98.2 F (36.8 C)  TempSrc: Oral Oral Oral Oral  SpO2: 93% 93% 98% 96%  Weight:       General: Pt is in NAD HEENT: [place]/AT Cardiac: Normal S1, S2. RRR, no murmurs, rubs, or gallops Respiratory: Pt is in no respiratory distress GI: Abdomen is soft and non-tender with normoactive bowel sounds Neuro: Pt is not oriented to place or time Extremities: Right BKA, left AKA for lower extremities Skin: Erythema and ulceration present on surgical incision site of right BKA stump  Assessment/Plan:  Active Problems:   PVD (peripheral vascular disease) (HCC)   ESRD on hemodialysis (HCC)   Cellulitis and abscess of leg   Severe protein-calorie malnutrition (HCC)   Cellulitis   Cellulitis of right leg   Dehiscence of amputation stump (HCC)   Abscess of right leg  Pt is a 59-yr-old man with a PMH of ESRD, pancreatic insufficiency, HTN, T2DM, and bilateral LE amputations who presented to the ED withworsening pain, erythema, and purulent discharge at his RLE BKA site. Pt is receiving vancomycin for purulent cellulitis.  Purulent cellulitis: Pt is receiving vancomycin to cover MRSA. Wound and blood cultures pending. X-ray does not show destructive bony changes but severe calcified peripheral vascular disease is present. Orthopedic surgery to perform revision of pt's  transtibial amputation today -IV vancomycin 1-5 g per 200 mL -IV hydromorphone 0.5-1 mg q3h PRN -Follow up on wound and blood cultures -Revision of transtibial amputation today per ortho note -Monitor CBC daily  AMS: Pt is not oriented to place or time. -Continue hemodialysis -Monitor for alertness  ESRD  Chronic Anemia: Hemodialysis to be performed today. Potassium 4.0 today. Nepro to continue MWF dialysis schedule -MWF dialysis  -Renal function panel  PVD: On X-ray, popliteal artery heavily calcified in right BKA stump. Per ortho note, warrants revision of right transtibial amputation -Revision of transtibial amputation per ortho note  Pancreatic insufficiency:  -Creon 72,000 units TID  HTN: -Amlodipine 5 mg QD -Carvedilol 12.5 mg BID  T2DM: -SSI   FEN/GI: -Feeding supplement liquid 30 mL BID  -Multivitamin 1 tablet QD  DVT prophylaxis:  -IV heparin 5000 units q8h SQ  CODE STATUS:FULL  Prior to Admission Living Arrangement: Home Anticipated Discharge Location: Home Barriers to Discharge: None Dispo: Anticipated discharge in greater than [place], Medical Student 05/23/2019, 10:59 AM Pager: [phone]

## 2019-05-23 NOTE — Op Note (Signed)
05/23/2019  4:22 PM  PATIENT:  Matthew Kline    PRE-OPERATIVE DIAGNOSIS: Dehiscence and small purulent drainage from the right transtibial amputation  POST-OPERATIVE DIAGNOSIS: Large abscess extending proximal to the popliteal fossa with necrotic gastroc muscles  PROCEDURE:  REVISION OF RIGHT BKA  SURGEON:  [name], MD  PHYSICIAN ASSISTANT:None ANESTHESIA:   General  PREOPERATIVE INDICATIONS:  Matthew Kline is a  59 y.o. male with a diagnosis of Wound dehiscence who failed conservative measures and elected for surgical management.    The risks benefits and alternatives were discussed with the patient preoperatively including but not limited to the risks of infection, bleeding, nerve injury, cardiopulmonary complications, the need for revision surgery, among others, and the patient was willing to proceed.  OPERATIVE IMPLANTS: Packed open with Kerlix  @ENCIMAGES @  OPERATIVE FINDINGS: Large abscess that extended proximal to the popliteal fossa with necrotic gastroc muscles  OPERATIVE PROCEDURE: Patient was brought the operating room and underwent a general anesthetic.  After adequate levels anesthesia were obtained patient's right lower extremity was prepped using DuraPrep draped into a sterile field a timeout was called.  A fishmouth incision was made around the incision to encompass the small areas of purulent drainage.  A large abscess was encountered with necrosis of the gastrocnemius muscles including the tendinous portion that extended proximal to the popliteal fossa.  Extensive debridement was performed to excise the gastroc muscles as well as the tendons.  The distal 3 cm of the tibia and fibula were resected these were both encompassed in the abscess.  The residual limb was debrided back to viable tissue.  The wound was irrigated with normal saline.  The wound was packed open with Kerlix.   DISCHARGE PLANNING:  Antibiotic duration: Continue IV  antibiotics  Weightbearing: Not applicable  Pain medication: Opioid pathway  Dressing care/ Wound VAC: Wound packed open reinforce as needed no wound VAC  Ambulatory devices: Not applicable  Discharge to: Plan for return to the operating room on Friday for an above-the-knee amputation.  Follow-up: In the office 1 week post operative.

## 2019-05-23 NOTE — Anesthesia Procedure Notes (Signed)
Procedure Name: LMA Insertion Date/Time: 05/23/2019 3:58 PM Performed by: Blessyn [place] T, CRNA Pre-anesthesia Checklist: Patient identified, Emergency Drugs available, Suction available and Patient being monitored Patient Re-evaluated:Patient Re-evaluated prior to induction Oxygen Delivery Method: Circle system utilized Preoxygenation: Pre-oxygenation with 100% oxygen Induction Type: IV induction Ventilation: Mask ventilation without difficulty LMA: LMA inserted LMA Size: 4.0 Number of attempts: 1 Placement Confirmation: positive ETCO2 and breath sounds checked- equal and bilateral Tube secured with: Tape Dental Injury: Teeth and Oropharynx as per pre-operative assessment

## 2019-05-23 NOTE — Progress Notes (Signed)
Received report from [place], RN that pt was on telemetry. Ask her to walk down with pt to short stay if there was no order for transport without tele. Pt arrived via transporter with no monitor or RN. RN states that MD will put orders for transport without tele.

## 2019-05-23 NOTE — Progress Notes (Signed)
[name], male   DOB: 1960-02-21, 59 y.o.   MRN: [id number] I spoke with patient's son, Merrily Pew, this morning he agrees with proceeding with surgery today.  With the patient's increased confusion we will have the son verify consent for surgery.  Surgery was originally planned for Friday however with the increased infection and patient's confusion it does not appear to be safe to wait until Friday.

## 2019-05-23 NOTE — H&P (View-Only) (Signed)
[name], male   DOB: 08/19/1960, 59 y.o.   MRN: [id number] I spoke with patient's son, Merrily Pew, this morning he agrees with proceeding with surgery today.  With the patient's increased confusion we will have the son verify consent for surgery.  Surgery was originally planned for Friday however with the increased infection and patient's confusion it does not appear to be safe to wait until Friday.

## 2019-05-23 NOTE — Evaluation (Signed)
Occupational Therapy Evaluation Patient Details Name: Matthew Kline MRN: [id number] DOB: 05-07-1960 Today's Date: 05/23/2019    History of Present Illness Matthew Kline is a 59 y.o. male who presents with diabetic insensate neuropathy severe peripheral vascular disease end-stage renal disease on dialysis with severe protein caloric malnutrition.  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  Patient reports a several week acute history of increasing pain and drainage from the right below the knee amputation. Pain and purulent drainage right transtibial amputation. Pt to have surgical revision of L LE 05/23/19.   Clinical Impression   Pt with impaired cognition and inability to offer detailed information about PLOF and home set up, but did say he lived alone in ground level apartment. Pt had soiled bed and unaware. Assisted pt to roll for pericare, linen change and placement of sacral foam. Required 2 person assist for bed mobility and total assist for ADL. Pt to have surgery this afternoon. Will follow acutely. Recommending SNF for further rehab prior to return home.     Follow Up Recommendations  SNF;Supervision/Assistance - 24 hour    Equipment Recommendations  Other (comment)(defer to next venue)    Recommendations for Other Services       Precautions / Restrictions Precautions Precautions: Fall Restrictions Weight Bearing Restrictions: Yes RLE Weight Bearing: Non weight bearing      Mobility Bed Mobility Overal bed mobility: Needs Assistance Bed Mobility: Rolling Rolling: +2 for physical assistance;Mod assist;Max assist         General bed mobility comments: +2 mod to R, +2 max to L side, able to maintain sidelying for pericare once placed  Transfers                 General transfer comment: deferred    Balance                                           ADL either performed or assessed  with clinical judgement   ADL                                         General ADL Comments: pt currently requiring total care     Vision         Perception     Praxis      Pertinent Vitals/Pain Pain Assessment: Faces Faces Pain Scale: Hurts whole lot Pain Location: R LE Pain Descriptors / Indicators: Grimacing;Guarding;Moaning Pain Intervention(s): Monitored during session;Limited activity within patient's tolerance;Repositioned     Hand Dominance Right   Extremity/Trunk Assessment Upper Extremity Assessment Upper Extremity Assessment: Generalized weakness;Difficult to assess due to impaired cognition   Lower Extremity Assessment Lower Extremity Assessment: Defer to PT evaluation       Communication Communication Communication: No difficulties   Cognition Arousal/Alertness: Lethargic(with brief periods of alertness) Behavior During Therapy: Flat affect Overall Cognitive Status: Impaired/Different from baseline Area of Impairment: Orientation;Attention;Following commands;Awareness;Problem solving                 Orientation Level: Disoriented to;Situation Current Attention Level: Focused   Following Commands: Follows one step commands inconsistently;Follows one step commands with increased time(and multimodal cues)   Awareness: Intellectual Problem Solving: Slow processing;Decreased initiation;Difficulty sequencing;Requires verbal cues;Requires tactile cues     General  Comments       Exercises     Shoulder Instructions      Home Living Family/patient expects to be discharged to:: Private residence Living Arrangements: Alone   Type of Home: Apartment Home Access: Level entry     Home Layout: One level               Home Equipment: Walker - 2 wheels;Wheelchair - manual;Tub bench   Additional Comments: L LE prosthetic      Prior Functioning/Environment Level of Independence: Independent with assistive device(s)         Comments: pt with inconsistent ability to offer information about home set up and PLOF.        OT Problem List: Decreased strength;Decreased activity tolerance;Impaired balance (sitting and/or standing);Decreased cognition;Decreased knowledge of use of DME or AE;Pain      OT Treatment/Interventions: Self-care/ADL training;DME and/or AE instruction;Therapeutic activities;Patient/family education;Balance training;Cognitive remediation/compensation    OT Goals(Current goals can be found in the care plan section) Acute Rehab OT Goals Patient Stated Goal: pt unable to state OT Goal Formulation: Patient unable to participate in goal setting Time For Goal Achievement: 06/06/19 Potential to Achieve Goals: Fair ADL Goals Pt Will Perform Eating: with set-up;sitting Pt Will Perform Grooming: with supervision;sitting Pt Will Perform Upper Body Dressing: with supervision;sitting Pt Will Perform Lower Body Dressing: with min assist;sitting/lateral leans Pt Will Transfer to Toilet: with transfer board;with min assist;bedside commode Pt Will Perform Toileting - Clothing Manipulation and hygiene: with min assist;sitting/lateral leans Additional ADL Goal #1: Pt will perform bed mobility with min assist in preparation for ADL. Additional ADL Goal #2: Pt will be oriented x 4 using environmental cues.  OT Frequency: Min 2X/week   Barriers to D/C: Decreased caregiver support          Co-evaluation              AM-PAC OT "6 Clicks" Daily Activity     Outcome Measure Help from another person eating meals?: Total Help from another person taking care of personal grooming?: Total Help from another person toileting, which includes using toliet, bedpan, or urinal?: Total Help from another person bathing (including washing, rinsing, drying)?: Total Help from another person to put on and taking off regular upper body clothing?: Total Help from another person to put on and taking off regular lower  body clothing?: Total 6 Click Score: 6   End of Session Nurse Communication: Other (comment)(aware pt had BM, applied cream to buttocks)  Activity Tolerance: Patient limited by pain;Patient limited by lethargy Patient left: in bed;with call bell/phone within reach;with bed alarm set;with nursing/sitter in room  OT Visit Diagnosis: Pain;Muscle weakness (generalized) (M62.81);Other symptoms and signs involving cognitive function                Time: 1011-1033 OT Time Calculation (min): 22 min Charges:  OT General Charges $OT Visit: 1 Visit OT Evaluation $OT Eval Moderate Complexity: 1 Mod  Nestor Lewandowsky, OTR/L Acute Rehabilitation Services Pager: [phone] Office: [phone]  Malka So 05/23/2019, 11:39 AM

## 2019-05-23 NOTE — Progress Notes (Addendum)
[name], male   DOB: 31-Aug-1960, 59 y.o.   MRN: [id number] Despite IV antibiotics patient has had progressive purulent drainage from his transtibial amputation.  Radiographs shows no destructive bony changes but has severe calcified peripheral vascular disease.  Patient has purulent drainage this morning.  Will plan for add-on surgery today for revision of the transtibial amputation.

## 2019-05-23 NOTE — Anesthesia Preprocedure Evaluation (Signed)
Anesthesia Evaluation  Patient identified by MRN, date of birth, ID band Patient awake    Reviewed: Allergy & Precautions, NPO status , Patient's Chart, lab work & pertinent test results  Airway Mallampati: II  TM Distance: >3 FB Neck ROM: Full    Dental  (+) Poor Dentition, Missing, Chipped, Dental Advisory Given   Pulmonary former smoker,    Pulmonary exam normal        Cardiovascular hypertension, + CAD, + Past MI, + CABG, + Peripheral Vascular Disease and +CHF   Rhythm:Regular Rate:Normal     Neuro/Psych    GI/Hepatic Neg liver ROS, GERD  ,  Endo/Other  diabetes, Type 2  Renal/GU ESRF and DialysisRenal disease     Musculoskeletal  (+) Arthritis ,   Abdominal Normal abdominal exam  (+)   Peds  Hematology negative hematology ROS (+)   Anesthesia Other Findings   Reproductive/Obstetrics                             Anesthesia Physical Anesthesia Plan  ASA: IV  Anesthesia Plan: General   Post-op Pain Management:    Induction: Intravenous  PONV Risk Score and Plan: 3 and Ondansetron and Treatment may vary due to age or medical condition  Airway Management Planned: LMA  Additional Equipment: None  Intra-op Plan:   Post-operative Plan: Extubation in OR  Informed Consent: I have reviewed the patients History and Physical, chart, labs and discussed the procedure including the risks, benefits and alternatives for the proposed anesthesia with the patient or authorized representative who has indicated his/her understanding and acceptance.     Dental advisory given  Plan Discussed with: CRNA  Anesthesia Plan Comments: (Pt sleepy but arousable. Denies pain in RLE.  EKG: normal sinus rhythm.  Echo:   1. The left ventricle has severely reduced systolic function, with an  ejection fraction of 25-30%. The cavity size was normal. Left ventricular  diastolic function could not be  evaluated due to nondiagnostic images.  Left ventricular diffuse hypokinesis.  2. Spontaneous echo contrast noted in LV suggestive of low flow.  3. The right ventricle has moderately reduced systolic function. The  cavity was severely enlarged. There is no increase in right ventricular  wall thickness. Right ventricular systolic pressure is mildly elevated  with PASP 56mmHg.  4. Left atrial size was moderately dilated.  5. Right atrial size was moderately dilated.  6. The aortic valve is tricuspid. Moderate sclerosis of the aortic valve.  Moderate aortic annular calcification noted.  7. There is reduced motion of AV cusps but no doppler interrogation of  the AV was done.  8. The mitral valve is degenerative. Moderate thickening of the mitral  valve leaflet. There is mild mitral annular calcification present.  9. The inferior vena cava was dilated in size with <50% respiratory  variability.  10. Recommend limited echo to assess for AS.  11. The aorta is normal in size and structure.  )        Anesthesia Quick Evaluation

## 2019-05-23 NOTE — Transfer of Care (Signed)
Immediate Anesthesia Transfer of Care Note  Patient: Sir Mallis  Procedure(s) Performed: REVISION OF RIGHT BKA (Right Leg Lower)  Patient Location: PACU  Anesthesia Type:General  Level of Consciousness: awake, alert  and oriented  Airway & Oxygen Therapy: Patient Spontanous Breathing and Patient connected to nasal cannula oxygen  Post-op Assessment: Report given to RN, Post -op Vital signs reviewed and stable and Patient moving all extremities  Post vital signs: Reviewed and stable  Last Vitals:  Vitals Value Taken Time  BP 112/94 05/23/19 1633  Temp    Pulse 66 05/23/19 1641  Resp 22 05/23/19 1641  SpO2 96 % 05/23/19 1641  Vitals shown include unvalidated device data.  Last Pain:  Vitals:   05/23/19 1103  TempSrc: Oral  PainSc: Asleep         Complications: No apparent anesthesia complications

## 2019-05-23 NOTE — Progress Notes (Signed)
[facility] Progress Note   Subjective:   Patient seen and examined at bedside.  Continues to be altered.  Oriented to name and place only but takes requires a lot of redirection to answer questions.  Denies SOB, CP, edema, orthopnea, n/v/d.  Objective Vitals:   05/22/19 2051 05/23/19 0452 05/23/19 0452 05/23/19 1103  BP: 114/74 125/81 125/81 122/80  Pulse: 71 78 77 80  Resp: 19 20 20 18   Temp: 99 F (37.2 C) 98.2 F (36.8 C) 98.2 F (36.8 C) 98 F (36.7 C)  TempSrc: Oral Oral Oral Oral  SpO2: 93% 98% 96% 94%  Weight:       Physical Exam General:chronically ill appearing male in NAD Heart:RRR Lungs:CTAB, BS decreased, nml WOB Abdomen:soft, NTND Extremities:L AKA, R BKA w/swelling & erythema at base, dressing in place Dialysis Access: LU AVF +b Neuro: +asterixis, oriented to person/place   Filed Weights   05/21/19 0734 05/21/19 1842 05/21/19 2253  Weight: 62 kg 64.2 kg 62.8 kg    Intake/Output Summary (Last 24 hours) at 05/23/2019 1304 Last data filed at 05/23/2019 0900 Gross per 24 hour  Intake 180 ml  Output 2 ml  Net 178 ml    Additional Objective Labs: Basic Metabolic Panel: Recent Labs  Lab 05/21/19 0739 05/22/19 0918 05/23/19 0541  NA 138 135 136  K 5.4* 4.2 4.0  CL 97* 95* 95*  CO2 24 26 26   GLUCOSE 174* 80 96  BUN 52* 21* 30*  CREATININE 8.97* 4.59* 5.56*  CALCIUM 9.2 8.6* 9.0  PHOS  --  4.1 3.9   Liver Function Tests: Recent Labs  Lab 05/21/19 0739 05/22/19 0918 05/23/19 0541  AST 17  --   --   ALT 12  --   --   ALKPHOS 119  --   --   BILITOT 1.5*  --   --   PROT 7.5  --   --   ALBUMIN 2.5* 2.1* 2.1*   CBC: Recent Labs  Lab 05/21/19 0739 05/22/19 0301 05/23/19 0541  WBC 11.8* 12.9* 12.3*  NEUTROABS 9.8*  --  10.7*  HGB 11.2* 10.2* 10.1*  HCT 35.7* 32.0* 31.5*  MCV 95.5 93.3 92.9  PLT 149* 128* 117*   Blood Culture    Component Value Date/Time   SDES BLOOD RIGHT ARM 05/21/2019 0851   SPECREQUEST   05/21/2019 0851    BOTTLES DRAWN AEROBIC AND ANAEROBIC Blood Culture results may not be optimal due to an excessive volume of blood received in culture bottles   CULT  05/21/2019 0851    NO GROWTH 2 DAYS Performed at [facility], [address], [place], [place] [address]    REPTSTATUS PENDING 05/21/2019 0851   CBG: Recent Labs  Lab 05/22/19 1117 05/22/19 1608 05/22/19 2052 05/23/19 0637 05/23/19 1132  GLUCAP 70 117* 139* 86 156*   Studies/Results: DG Tibia/Fibula Right Port  Result Date: 05/22/2019 CLINICAL DATA:  Cellulitis and abscess of the leg. Below the knee amputation. EXAM: PORTABLE RIGHT TIBIA AND FIBULA - 2 VIEW COMPARISON:  None. FINDINGS: Right below the knee amputation is noted. Soft tissue swelling is present at the stump. No gas or abscess is evident. Extensive atherosclerotic calcifications are present. Degenerative changes are noted at knee. IMPRESSION: 1. Soft tissue swelling at the stump without definite abscess. Electronically Signed   By: San Morelle M.D.   On: 05/22/2019 13:59    Medications: . vancomycin     . amLODipine  5 mg Oral QPM  .  aspirin EC  81 mg Oral QHS  . carvedilol  12.5 mg Oral BID WC  . Chlorhexidine Gluconate Cloth  6 each Topical Q0600  . darbepoetin (ARANESP) injection - DIALYSIS  60 mcg Intravenous Q Mon-HD  . feeding supplement (PRO-STAT SUGAR FREE 64)  30 mL Oral BID  . gabapentin  200 mg Oral Daily  . heparin  5,000 Units Subcutaneous Q8H  . insulin aspart  0-5 Units Subcutaneous QHS  . insulin aspart  0-6 Units Subcutaneous TID WC  . lipase/protease/amylase  72,000 Units Oral TID with meals  . multivitamin  1 tablet Oral QHS  . sertraline  100 mg Oral Daily  . sevelamer carbonate  2,400 mg Oral TID WC  . temazepam  30 mg Oral QHS    Dialysis Orders[facility]. 180Nre, BFR 400, DFR Auto 1.5, EDW 63.5kg, 2K/2Ca Heparin 4000 unit bolus Venofer 100mg  IV q HD- received 5/10  doses Mircera 100 mcg q 2 weeks- last dose 4/23  Assessment/Plan: 1. R stump cellulitis:WBC 12.3. Afebrile. WC +staph aureus and BC - NGTD. R tib/fib Xray w/soft tissue swelling and no abscess. On vancomycin. Plans for revision of BKA today.  Management per primary. 2. AMS - likely d/t uremia + infection + meds. Continue HD per regular schedule.  Plan for revision of R BKA today.  3. ESRD:on HD MWF.  Missed 1 week of HD. K4.0, BUN 30, +asterixis.  HD today per regular schedule. 4. Hypertension/volume:BP well controlled, euvolemic on exam, under EDW by weights.  Titrate down volume as tolerated.  Will need new EDW on d/c.  Plan for UF goal 1-2L tomorrow.  5. Anemia:Hgb 10.1. Aranesp 88mcg not given with HD Monday, order with HD today. Holding IV Fe in setting of acute infection. Patient denies any recent blood loss. 6. Metabolic bone disease:CorrCa 10.5. Not on VDRA. Phos at goal. Continue renvela. 7. Nutrition:Alb 2.1.Renal diet/fluid restrictions. On protein supplements as outpatient- will order prostat.   Jen Mow, PA-C [facility] Pager: [phone] 05/23/2019,1:04 PM  LOS: 2 days

## 2019-05-24 ENCOUNTER — Other Ambulatory Visit: Payer: Self-pay | Admitting: Physician Assistant

## 2019-05-24 LAB — GLUCOSE, CAPILLARY
Glucose-Capillary: 72 mg/dL (ref 70–99)
Glucose-Capillary: 100 mg/dL — ABNORMAL HIGH (ref 70–99)
Glucose-Capillary: 86 mg/dL (ref 70–99)
Glucose-Capillary: 91 mg/dL (ref 70–99)

## 2019-05-24 LAB — RENAL FUNCTION PANEL
Albumin: 2 g/dL — ABNORMAL LOW (ref 3.5–5.0)
Anion gap: 9 (ref 5–15)
BUN: 9 mg/dL (ref 6–20)
CO2: 28 mmol/L (ref 22–32)
Calcium: 7.8 mg/dL — ABNORMAL LOW (ref 8.9–10.3)
Chloride: 99 mmol/L (ref 98–111)
Creatinine, Ser: 2.74 mg/dL — ABNORMAL HIGH (ref 0.61–1.24)
GFR calc Af Amer: 28 mL/min — ABNORMAL LOW (ref >60)
GFR calc non Af Amer: 24 mL/min — ABNORMAL LOW (ref >60)
Glucose, Bld: 80 mg/dL (ref 70–99)
Phosphorus: 2.5 mg/dL (ref 2.5–4.6)
Potassium: 3.1 mmol/L — ABNORMAL LOW (ref 3.5–5.1)
Sodium: 136 mmol/L (ref 135–145)

## 2019-05-24 LAB — CBC
HCT: 30.4 % — ABNORMAL LOW (ref 39.0–52.0)
Hemoglobin: 9.6 g/dL — ABNORMAL LOW (ref 13.0–17.0)
MCH: 29.4 pg (ref 26.0–34.0)
MCHC: 31.6 g/dL (ref 30.0–36.0)
MCV: 93.3 fL (ref 80.0–100.0)
Platelets: 105 10*3/uL — ABNORMAL LOW (ref 150–400)
RBC: 3.26 MIL/uL — ABNORMAL LOW (ref 4.22–5.81)
RDW: 16.9 % — ABNORMAL HIGH (ref 11.5–15.5)
WBC: 11.6 10*3/uL — ABNORMAL HIGH (ref 4.0–10.5)
nRBC: 0 % (ref 0.0–0.2)

## 2019-05-24 MED ORDER — CEFAZOLIN SODIUM-DEXTROSE 2-4 GM/100ML-% IV SOLN
2.0000 g | INTRAVENOUS | Status: AC
  Administered 2019-05-25: 2 g via INTRAVENOUS
  Filled 2019-05-24 (×2): qty 100

## 2019-05-24 MED ORDER — NEPRO/CARBSTEADY PO LIQD
237.0000 mL | Freq: Three times a day (TID) | ORAL | Status: DC
  Administered 2019-05-25 – 2019-05-29 (×8): 237 mL via ORAL
  Filled 2019-05-24 (×2): qty 237

## 2019-05-24 MED ORDER — CHLORHEXIDINE GLUCONATE CLOTH 2 % EX PADS
6.0000 | MEDICATED_PAD | Freq: Every day | CUTANEOUS | Status: DC
  Administered 2019-05-26: 6 via TOPICAL

## 2019-05-24 MED ORDER — DARBEPOETIN ALFA 60 MCG/0.3ML IJ SOSY
PREFILLED_SYRINGE | INTRAMUSCULAR | Status: AC
  Administered 2019-05-24: 60 ug via INTRAVENOUS
  Filled 2019-05-24: qty 0.3

## 2019-05-24 MED ORDER — MUPIROCIN 2 % EX OINT
1.0000 "application " | TOPICAL_OINTMENT | Freq: Two times a day (BID) | CUTANEOUS | Status: AC
  Administered 2019-05-24 – 2019-05-29 (×9): 1 via NASAL
  Filled 2019-05-24: qty 22

## 2019-05-24 MED ORDER — JUVEN PO PACK
1.0000 | PACK | Freq: Two times a day (BID) | ORAL | Status: DC
  Administered 2019-05-26 – 2019-05-30 (×6): 1 via ORAL
  Filled 2019-05-24 (×7): qty 1

## 2019-05-24 MED ORDER — VANCOMYCIN HCL IN DEXTROSE 750-5 MG/150ML-% IV SOLN
INTRAVENOUS | Status: AC
  Administered 2019-05-24: 750 mg via INTRAVENOUS
  Filled 2019-05-24: qty 150

## 2019-05-24 NOTE — Progress Notes (Signed)
[facility] Progress Note   Subjective:  Patient seen and examined at bedside.  Continues to be confused.  Oriented to self only.  Large abscess identified during surgery yesterday, scheduled for AKA tomorrow. HD overnight, tolerated well per notes.  Patient does not recall going.   Objective Vitals:   05/24/19 0531 05/24/19 0558 05/24/19 0855 05/24/19 0857  BP: 115/69 125/76 109/73   Pulse: 73 72 76   Resp: 17 18 18    Temp: 98.2 F (36.8 C) 98 F (36.7 C) 98.2 F (36.8 C)   TempSrc: Oral Oral Oral   SpO2:  93% 92% 96%  Weight: 60.3 kg     Height:       Physical Exam General:Chronically ill appearing, confused, thin male in NAD Heart:RRR Lungs:CTAB, nml WOB Abdomen:soft, NTND Extremities:L AKA, R BKA dressed +pain Dialysis Access: LU AVF +b    Filed Weights   05/23/19 1352 05/24/19 0112 05/24/19 0531  Weight: 62.8 kg 61.3 kg 60.3 kg    Intake/Output Summary (Last 24 hours) at 05/24/2019 1208 Last data filed at 05/24/2019 0900 Gross per 24 hour  Intake 470 ml  Output 1100 ml  Net -630 ml    Additional Objective Labs: Basic Metabolic Panel: Recent Labs  Lab 05/22/19 0918 05/23/19 0541 05/24/19 0659  NA 135 136 136  K 4.2 4.0 3.1*  CL 95* 95* 99  CO2 26 26 28   GLUCOSE 80 96 80  BUN 21* 30* 9  CREATININE 4.59* 5.56* 2.74*  CALCIUM 8.6* 9.0 7.8*  PHOS 4.1 3.9 2.5   Liver Function Tests: Recent Labs  Lab 05/21/19 0739 05/21/19 0739 05/22/19 0918 05/23/19 0541 05/24/19 0659  AST 17  --   --   --   --   ALT 12  --   --   --   --   ALKPHOS 119  --   --   --   --   BILITOT 1.5*  --   --   --   --   PROT 7.5  --   --   --   --   ALBUMIN 2.5*   < > 2.1* 2.1* 2.0*   < > = values in this interval not displayed.   CBC: Recent Labs  Lab 05/21/19 0739 05/21/19 0739 05/22/19 0301 05/23/19 0541 05/24/19 0659  WBC 11.8*   < > 12.9* 12.3* 11.6*  NEUTROABS 9.8*  --   --  10.7*  --   HGB 11.2*   < > 10.2* 10.1* 9.6*  HCT 35.7*   < > 32.0*  31.5* 30.4*  MCV 95.5  --  93.3 92.9 93.3  PLT 149*   < > 128* 117* 105*   < > = values in this interval not displayed.   Blood Culture    Component Value Date/Time   SDES BLOOD RIGHT ARM 05/21/2019 0851   SPECREQUEST  05/21/2019 0851    BOTTLES DRAWN AEROBIC AND ANAEROBIC Blood Culture results may not be optimal due to an excessive volume of blood received in culture bottles   CULT  05/21/2019 0851    NO GROWTH 3 DAYS Performed at [facility], [place] [address], [place], [place] [address]    REPTSTATUS PENDING 05/21/2019 0851   CBG: Recent Labs  Lab 05/23/19 1132 05/23/19 1810 05/23/19 2231 05/24/19 0746 05/24/19 1131  GLUCAP 156* 101* 92 72 100*    Medications: . sodium chloride    . vancomycin 750 mg (05/24/19 0442)   . amLODipine  5 mg Oral QPM  . aspirin EC  81 mg Oral QHS  . carvedilol  12.5 mg Oral BID WC  . Chlorhexidine Gluconate Cloth  6 each Topical Q0600  . darbepoetin (ARANESP) injection - DIALYSIS  60 mcg Intravenous Q Wed-HD  . docusate sodium  100 mg Oral BID  . feeding supplement (PRO-STAT SUGAR FREE 64)  30 mL Oral BID  . gabapentin  200 mg Oral Daily  . heparin  5,000 Units Subcutaneous Q8H  . insulin aspart  0-5 Units Subcutaneous QHS  . insulin aspart  0-6 Units Subcutaneous TID WC  . lipase/protease/amylase  72,000 Units Oral TID with meals  . multivitamin  1 tablet Oral QHS  . sertraline  100 mg Oral Daily  . sevelamer carbonate  2,400 mg Oral TID WC    [facility] 180[facility], BFR 400, DFR Auto 1.5, EDW 63.5kg, 2K/2Ca Heparin 4000 unit bolus Venofer 100mg  IV q HD- received 5/10 doses Mircera 100 mcg q 2 weeks- last dose 4/23  Assessment/Plan: 1. R stump cellulitis:WBC 11.6.Afebrile. WC +MSSA and BC- NGTD. R tib/fib Xray w/soft tissue swelling and no abscess. Onvancomycin. Large abscess with necrosis of gastrocnemius muscle & tendinous attachemnt found during revision and debridement.   Plan for AKA.   2. AMS - likely d/t infection, +/-meds. Continue HD per regular schedule. Plan to AKA tomorrow. 3. ESRD:on HD MWF. Missed 1 week of HD. K3.1, BUN 30, +asterixis. HD earlier this AM.  Orders written for HD tomorrow post surgery per regular schedule.  4. Hypertension/volume:BP well controlled, euvolemic on exam, under EDW by weights.Titrate down volume as tolerated. Will need new EDW on d/c. Plan for UF goal 1-2L tomorrow.  5. Anemia:Hgb9.6.Aranesp 49mcg not given with HD Monday, order with HD today.Holding IV Fe in setting of acute infection. Patient denies any recent blood loss. 6. Metabolic bone disease:CorrCa 9.4 Not on VDRA.Phos at goal.Continue renvela. 7. Nutrition:Alb 2.0.Renal diet/fluid restrictions. On protein supplements as outpatient- will order prostat  Jen Mow, PA-C [facility] Pager: [phone] 05/24/2019,12:08 PM  LOS: 3 days

## 2019-05-24 NOTE — Anesthesia Postprocedure Evaluation (Signed)
Anesthesia Post Note  Patient: Martavious Hartel  Procedure(s) Performed: REVISION OF RIGHT BKA (Right Leg Lower)     Patient location during evaluation: PACU Anesthesia Type: General Level of consciousness: awake and alert Pain management: pain level controlled Vital Signs Assessment: post-procedure vital signs reviewed and stable Respiratory status: spontaneous breathing, nonlabored ventilation, respiratory function stable and patient connected to nasal cannula oxygen Cardiovascular status: blood pressure returned to baseline and stable Postop Assessment: no apparent nausea or vomiting Anesthetic complications: no    Last Vitals:  Vitals:   05/24/19 0857 05/24/19 1558  BP:  129/81  Pulse:  76  Resp:  16  Temp:  37.2 C  SpO2: 96% 94%               Effie Berkshire

## 2019-05-24 NOTE — Progress Notes (Signed)
OT Cancellation Note  Patient Details Name: Matthew Kline MRN: [id number] DOB: 1960/05/23   Cancelled Treatment:    Reason Eval/Treat Not Completed: Patient not medically ready. Pt going to HD today, scheduled for AKA tomorrow, OT offered to work with patient to sit EOB and perform grooming tasks to work on balance and Pt adamantly refusing any therapy at this time. OT will continue to follow acutely.  Merri Ray Nikola Marone 05/24/2019, 9:15 AM

## 2019-05-24 NOTE — Progress Notes (Addendum)
   Subjective: Ptis a 59-yr-oldmanwith a PMH ofESRD, pancreatic insufficiency, HTN,T2DM, and bilateral LE amputations who presented to the ED withworsening pain, erythema, and purulent discharge at his RLE BKA site. No overnight events.Pt is minimally responsive to questions.He is not oriented to place or time. Cannot say where he is or what year it is. Reports pain but does not specify where.  Objective:  Vital signs in last 24 hours: Vitals:   05/24/19 0531 05/24/19 0558 05/24/19 0855 05/24/19 0857  BP: 115/69 125/76 109/73   Pulse: 73 72 76   Resp: 17 18 18    Temp: 98.2 F (36.8 C) 98 F (36.7 C) 98.2 F (36.8 C)   TempSrc: Oral Oral Oral   SpO2:  93% 92% 96%  Weight: 60.3 kg     Height:       General: Pt is chronically ill-appearing and mildly anxious HEENT: [place]/AT Cardiac: Normal S1, S2. RRR, no murmurs, rubs, or gallops Respiratory: Pt is in no respiratory distress GI: Abdomen is soft Neuro: Pt is not oriented to place or time Extremities: Right BKA, left AKA for lower extremities Skin: Deferred due to pt distress  Labs:   Ref Range & Units 1 d ago 4 mo ago 7 mo ago 9 mo ago 2 yr ago  MRSA, PCR NEGATIVE NEGATIVE  NEGATIVE  NEGATIVE  NEGATIVE  NEGATIVE   Staphylococcus aureus NEGATIVE POSITIVEAbnormal   NEGATIVE CM  NEGATIVE CM  NEGATIVE CM  POSITIVEAbnormal  CM    Assessment/Plan:  Active Problems:   PVD (peripheral vascular disease) (HCC)   ESRD on hemodialysis (HCC)   Cellulitis and abscess of leg   Severe protein-calorie malnutrition (HCC)   Cellulitis   Cellulitis of right leg   Dehiscence of amputation stump (HCC)   Abscess of right leg  Ptis a 59-yr-oldmanwith a PMH ofESRD, pancreatic insufficiency, HTN,T2DM, and bilateral LE amputations who presented to the ED withworsening pain, erythema, and purulent discharge at his RLE BKA site. Pt is receiving vancomycin for purulentcellulitis. Wound culture positive for MSSA.  Purulentcellulitis: Pt  is receiving vancomycin. Wound culture positive for MSSA. Blood culture negative for microbial growth. Revision of right BKA performed yesterday. A large abscess that extended proximal to the popliteal fossa with necrosis of the gastrocnemius muscle and tendinous attachment was found intraoperatively and extensively debrided. Orthopedic surgery to perform right AKA, planned for Friday -IV vancomycin 1-5 g per 200 mL -IV hydromorphone 0.5-1 mg q3h PRN -Right AKA by ortho surg scheduled for Friday -Monitor CBC daily  AMS: Pt is not oriented to place or time. -Continue hemodialysis -Monitor for alertness  ESRD  Chronic Anemia: Potassium 3.1. BUN 9, Cr 2.74. Nephro to continue MWF dialysis schedule -MWF dialysis -Renal function panel  PVD: On X-ray, popliteal arteryheavilycalcifiedin right BKA stump. Orthopedic surgery plans to perform right AKA on Friday -Right AKA scheduled for Friday  Pancreatic insufficiency: -Creon72,000 units TID  HTN: -Amlodipine5 mg QD -Carvedilol12.5 mg BID  T2DM: -SSI   FEN/GI: -Feeding supplement liquid 30 mL BID  -Multivitamin 1 tablet QD  DVT prophylaxis: -IV heparin5000 unitsq8hSQ  CODE STATUS:FULL  Prior to Admission Living Arrangement: Home Anticipated Discharge Location: Home Barriers to Discharge: None Dispo: Anticipated discharge in greater than [place], Medical Student 05/24/2019, 9:33 AM Pager: [phone]

## 2019-05-24 NOTE — Progress Notes (Signed)
[name], male   DOB: [birth year], 59 y.o.   MRN: [id number] Patient is postoperative day 1 revision right transtibial amputation.  Patient had a large abscess that extended proximal to the popliteal fossa with necrosis of the gastrocnemius muscle and tendinous attachment.  The wound was packed open.  Patient is more alert this morning.  Discussed with patient we will need to proceed with a above-the-knee amputation tomorrow Friday.  Anticipate dialysis today.

## 2019-05-24 NOTE — Progress Notes (Signed)
Initial Nutrition Assessment  DOCUMENTATION CODES:   Severe malnutrition in context of chronic illness  INTERVENTION:  -Nepro Shake po TID, each supplement provides 425 kcal and 19 grams protein  -1 packet Juven BID, each packet provides 95 calories, 2.5 grams of protein (collagen), and 9.8 grams of carbohydrate (3 grams sugar); also contains 7 grams of L-arginine and L-glutamine, 300 mg vitamin C, 15 mg vitamin E, 1.2 mcg vitamin B-12, 9.5 mg zinc, 200 mg calcium, and 1.5 g  Calcium Beta-hydroxy-Beta-methylbutyrate to support wound healing  -Continue 38ml pro-stat BID  NUTRITION DIAGNOSIS:   Severe Malnutrition related to chronic illness as evidenced by severe muscle depletion, severe fat depletion.    GOAL:   Patient will meet greater than or equal to 90% of their needs    MONITOR:   PO intake, Supplement acceptance, Skin, Labs, I & O's  REASON FOR ASSESSMENT:   Other (Comment)(wound healing)    ASSESSMENT:   Pt with a PMH significant for ESRD, pancreatic insufficiency, HTN, T2DM, and BLE amputations who presented with worsening pain, erthema, and purulent discharge from RLE BKA. Pt found to have a large abscess that extended proximal to the popliteal fossa with necrosis of the gastrocnemius muscle and tendinous attachment  5/12 - s/p revision R transtibial amputation  RN in room at time of visit.  Pt is a poor historian and is unable to answer RD questions at this time.   Pt will have R AKA tomorrow.   PO Intake: 0-50% x last 6 recorded meals (21% average meal intake)  Pt receiving dialysis MWF. Per nephrology, pt's EDW is 63.5 kg. Pt now below EDW, current wt 60.3 kg.  Pt noted to be on protein supplements as outpatient.  Labs: K+ 3.1 (L), CBGs 72-101 Medications reviewed and include: Aranesp, Colace, 69ml Pro-stat BID, Novolog, Creon, Rena-vit, Renvela   NUTRITION - FOCUSED PHYSICAL EXAM:    Most Recent Value  Orbital Region  Moderate depletion  Upper  Arm Region  Severe depletion  Thoracic and Lumbar Region  Severe depletion  Buccal Region  Severe depletion  Temple Region  Severe depletion  Clavicle Bone Region  Severe depletion  Clavicle and Acromion Bone Region  Severe depletion  Scapular Bone Region  Severe depletion  Dorsal Hand  Severe depletion  Patellar Region  Unable to assess [bilateral BKA]  Anterior Thigh Region  Severe depletion  Posterior Calf Region  Unable to assess [bilateral BKA]  Edema (RD Assessment)  None  Hair  Reviewed  Eyes  Reviewed  Mouth  Reviewed  Skin  Reviewed  Nails  Reviewed       Diet Order:   Diet Order            Diet Carb Modified Fluid consistency: Thin; Room service appropriate? Yes  Diet effective now              EDUCATION NEEDS:   Not appropriate for education at this time  Skin:  Skin Assessment: Skin Integrity Issues: Skin Integrity Issues:: Incisions, Other (Comment) Incisions: R leg Other: non-pressure wound R stump  Last BM:  5/13 type 6  Height:   Ht Readings from Last 1 Encounters:  05/23/19 5' 10.98" (1.803 m)    Weight:   Wt Readings from Last 1 Encounters:  05/24/19 60.3 kg   BMI:  Body mass index is 18.55 kg/m.  Estimated Nutritional Needs:   Kcal:  1900-2100  Protein:  95-110 grams  Fluid:  1035ml + UOP   [place],  MS, RD, LDN RD pager number and weekend/on-call pager number located in [place].

## 2019-05-24 NOTE — Progress Notes (Signed)
PT Cancellation Note  Patient Details Name: Matthew Kline MRN: [id number] DOB: 1960/01/30   Cancelled Treatment:    Reason Eval/Treat Not Completed: Other (comment). Plan is now for pt to have an AKA done tomorrow. He will also have HD today. Pt refusing therapy at this time. PT will continue to f/u with pt and hopefully will be able work with him after his surgery tomorrow.    [place] 05/24/2019, 10:13 AM

## 2019-05-25 ENCOUNTER — Inpatient Hospital Stay (HOSPITAL_COMMUNITY): Payer: Medicare HMO | Admitting: Certified Registered Nurse Anesthetist

## 2019-05-25 ENCOUNTER — Encounter (HOSPITAL_COMMUNITY)
Admission: EM | Disposition: A | Discharge status: Skilled Nursing Facility | Payer: Self-pay | Source: Home / Self Care | Attending: Internal Medicine

## 2019-05-25 ENCOUNTER — Encounter (HOSPITAL_COMMUNITY): Payer: Self-pay | Admitting: Internal Medicine

## 2019-05-25 HISTORY — PX: AMPUTATION: SHX166

## 2019-05-25 LAB — RENAL FUNCTION PANEL
Albumin: 2 g/dL — ABNORMAL LOW (ref 3.5–5.0)
Anion gap: 12 (ref 5–15)
BUN: 21 mg/dL — ABNORMAL HIGH (ref 6–20)
CO2: 26 mmol/L (ref 22–32)
Calcium: 8.7 mg/dL — ABNORMAL LOW (ref 8.9–10.3)
Chloride: 98 mmol/L (ref 98–111)
Creatinine, Ser: 4.1 mg/dL — ABNORMAL HIGH (ref 0.61–1.24)
GFR calc Af Amer: 17 mL/min — ABNORMAL LOW (ref >60)
GFR calc non Af Amer: 15 mL/min — ABNORMAL LOW (ref >60)
Glucose, Bld: 91 mg/dL (ref 70–99)
Phosphorus: 3.6 mg/dL (ref 2.5–4.6)
Potassium: 3.5 mmol/L (ref 3.5–5.1)
Sodium: 136 mmol/L (ref 135–145)

## 2019-05-25 LAB — CBC
HCT: 31.6 % — ABNORMAL LOW (ref 39.0–52.0)
Hemoglobin: 10 g/dL — ABNORMAL LOW (ref 13.0–17.0)
MCH: 29.7 pg (ref 26.0–34.0)
MCHC: 31.6 g/dL (ref 30.0–36.0)
MCV: 93.8 fL (ref 80.0–100.0)
Platelets: 109 10*3/uL — ABNORMAL LOW (ref 150–400)
RBC: 3.37 MIL/uL — ABNORMAL LOW (ref 4.22–5.81)
RDW: 16.9 % — ABNORMAL HIGH (ref 11.5–15.5)
WBC: 9.9 10*3/uL (ref 4.0–10.5)
nRBC: 0 % (ref 0.0–0.2)

## 2019-05-25 LAB — GLUCOSE, CAPILLARY
Glucose-Capillary: 79 mg/dL (ref 70–99)
Glucose-Capillary: 78 mg/dL (ref 70–99)
Glucose-Capillary: 82 mg/dL (ref 70–99)
Glucose-Capillary: 89 mg/dL (ref 70–99)
Glucose-Capillary: 79 mg/dL (ref 70–99)
Glucose-Capillary: 80 mg/dL (ref 70–99)

## 2019-05-25 SURGERY — AMPUTATION, ABOVE KNEE
Anesthesia: General | Site: Knee | Laterality: Right

## 2019-05-25 MED ORDER — FENTANYL CITRATE (PF) 250 MCG/5ML IJ SOLN
INTRAMUSCULAR | Status: DC | PRN
  Administered 2019-05-25: 75 ug via INTRAVENOUS

## 2019-05-25 MED ORDER — FENTANYL CITRATE (PF) 100 MCG/2ML IJ SOLN
INTRAMUSCULAR | Status: AC
  Administered 2019-05-25: 25 ug via INTRAVENOUS
  Filled 2019-05-25: qty 2

## 2019-05-25 MED ORDER — SODIUM CHLORIDE 0.9 % IV SOLN: INTRAVENOUS | Status: DC

## 2019-05-25 MED ORDER — CEFAZOLIN SODIUM-DEXTROSE 1-4 GM/50ML-% IV SOLN
1.0000 g | Freq: Once | INTRAVENOUS | Status: AC
  Administered 2019-05-25: 1 g via INTRAVENOUS
  Filled 2019-05-25: qty 50

## 2019-05-25 MED ORDER — CHLORHEXIDINE GLUCONATE CLOTH 2 % EX PADS
6.0000 | MEDICATED_PAD | Freq: Every day | CUTANEOUS | Status: DC
Start: 2019-05-26 — End: 2019-05-25

## 2019-05-25 MED ORDER — ONDANSETRON HCL 4 MG/2ML IJ SOLN
INTRAMUSCULAR | Status: DC | PRN
  Administered 2019-05-25: 4 mg via INTRAVENOUS

## 2019-05-25 MED ORDER — DOCUSATE SODIUM 100 MG PO CAPS: 100.0000 mg | ORAL_CAPSULE | Freq: Two times a day (BID) | ORAL | Status: DC

## 2019-05-25 MED ORDER — PROPOFOL 10 MG/ML IV BOLUS
INTRAVENOUS | Status: DC | PRN
  Administered 2019-05-25: 80 mg via INTRAVENOUS

## 2019-05-25 MED ORDER — LIDOCAINE 2% (20 MG/ML) 5 ML SYRINGE
INTRAMUSCULAR | Status: DC | PRN
  Administered 2019-05-25: 40 mg via INTRAVENOUS

## 2019-05-25 MED ORDER — FENTANYL CITRATE (PF) 250 MCG/5ML IJ SOLN
INTRAMUSCULAR | Status: AC
  Filled 2019-05-25: qty 5

## 2019-05-25 MED ORDER — MIDAZOLAM HCL 2 MG/2ML IJ SOLN
INTRAMUSCULAR | Status: AC
  Filled 2019-05-25: qty 2

## 2019-05-25 MED ORDER — FENTANYL CITRATE (PF) 100 MCG/2ML IJ SOLN
25.0000 ug | INTRAMUSCULAR | Status: DC | PRN
  Administered 2019-05-25: 25 ug via INTRAVENOUS

## 2019-05-25 SURGICAL SUPPLY — 40 items
BLADE SAW RECIP 87.9 MT (BLADE) ×2 IMPLANT
BLADE SURG 21 STRL SS (BLADE) ×2 IMPLANT
BNDG COHESIVE 6X5 TAN STRL LF (GAUZE/BANDAGES/DRESSINGS) ×2 IMPLANT
CANISTER WOUND CARE 500ML ATS (WOUND CARE) IMPLANT
COVER SURGICAL LIGHT HANDLE (MISCELLANEOUS) ×2 IMPLANT
COVER WAND RF STERILE (DRAPES) IMPLANT
CUFF TOURN SGL QUICK 34 (TOURNIQUET CUFF)
CUFF TRNQT CYL 34X4.125X (TOURNIQUET CUFF) IMPLANT
DRAPE INCISE IOBAN 66X45 STRL (DRAPES) ×4 IMPLANT
DRAPE U-SHAPE 47X51 STRL (DRAPES) ×2 IMPLANT
DRESSING PREVENA PLUS CUSTOM (GAUZE/BANDAGES/DRESSINGS) ×1 IMPLANT
DRSG PREVENA PLUS CUSTOM (GAUZE/BANDAGES/DRESSINGS) ×2
DURAPREP 26ML APPLICATOR (WOUND CARE) ×2 IMPLANT
ELECT REM PT RETURN 9FT ADLT (ELECTROSURGICAL) ×2
ELECTRODE REM PT RTRN 9FT ADLT (ELECTROSURGICAL) ×1 IMPLANT
GLOVE BIOGEL PI IND STRL 7.5 (GLOVE) ×1 IMPLANT
GLOVE BIOGEL PI IND STRL 9 (GLOVE) ×1 IMPLANT
GLOVE BIOGEL PI INDICATOR 7.5 (GLOVE) ×1
GLOVE BIOGEL PI INDICATOR 9 (GLOVE) ×1
GLOVE SURG ORTHO 9.0 STRL STRW (GLOVE) ×2 IMPLANT
GLOVE SURG SS PI 6.5 STRL IVOR (GLOVE) ×2 IMPLANT
GOWN STRL REUS W/ TWL LRG LVL3 (GOWN DISPOSABLE) ×1 IMPLANT
GOWN STRL REUS W/ TWL XL LVL3 (GOWN DISPOSABLE) ×2 IMPLANT
GOWN STRL REUS W/TWL LRG LVL3 (GOWN DISPOSABLE) ×1
GOWN STRL REUS W/TWL XL LVL3 (GOWN DISPOSABLE) ×2
KIT BASIN OR (CUSTOM PROCEDURE TRAY) ×2 IMPLANT
KIT TURNOVER KIT B (KITS) ×2 IMPLANT
MANIFOLD NEPTUNE II (INSTRUMENTS) ×2 IMPLANT
NS IRRIG 1000ML POUR BTL (IV SOLUTION) ×2 IMPLANT
PACK ORTHO EXTREMITY (CUSTOM PROCEDURE TRAY) ×2 IMPLANT
PAD ARMBOARD 7.5X6 YLW CONV (MISCELLANEOUS) ×2 IMPLANT
PREVENA RESTOR ARTHOFORM 46X30 (CANNISTER) ×2 IMPLANT
STAPLER VISISTAT 35W (STAPLE) IMPLANT
STOCKINETTE IMPERVIOUS LG (DRAPES) IMPLANT
SUT ETHILON 2 0 PSLX (SUTURE) ×4 IMPLANT
SUT SILK 2 0 (SUTURE) ×1
SUT SILK 2-0 18XBRD TIE 12 (SUTURE) ×1 IMPLANT
TOWEL GREEN STERILE FF (TOWEL DISPOSABLE) ×2 IMPLANT
TUBE CONNECTING 20X1/4 (TUBING) ×2 IMPLANT
YANKAUER SUCT BULB TIP NO VENT (SUCTIONS) ×2 IMPLANT

## 2019-05-25 NOTE — Anesthesia Procedure Notes (Signed)
Procedure Name: LMA Insertion Date/Time: 05/25/2019 9:48 AM Performed by: Clearnce Sorrel, CRNA Pre-anesthesia Checklist: Patient identified, Emergency Drugs available, Suction available, Patient being monitored and Timeout performed Patient Re-evaluated:Patient Re-evaluated prior to induction Oxygen Delivery Method: Circle system utilized Preoxygenation: Pre-oxygenation with 100% oxygen Induction Type: IV induction LMA: LMA inserted LMA Size: 4.0 Number of attempts: 1 Placement Confirmation: positive ETCO2 and breath sounds checked- equal and bilateral Tube secured with: Tape Dental Injury: Teeth and Oropharynx as per pre-operative assessment

## 2019-05-25 NOTE — Anesthesia Postprocedure Evaluation (Signed)
Anesthesia Post Note  Patient: Madhav Mohon  Procedure(s) Performed: RIGHT ABOVE KNEE AMPUTATION (Right Knee)     Patient location during evaluation: PACU Anesthesia Type: General Level of consciousness: awake and alert Pain management: pain level controlled Vital Signs Assessment: post-procedure vital signs reviewed and stable Respiratory status: spontaneous breathing, nonlabored ventilation, respiratory function stable and patient connected to nasal cannula oxygen Cardiovascular status: blood pressure returned to baseline and stable Postop Assessment: no apparent nausea or vomiting Anesthetic complications: no    Last Vitals:  Vitals:   05/25/19 1214 05/25/19 1415  BP: 121/75 115/82  Pulse: 69 75  Resp: 18 18  Temp: 36.6 C 36.8 C  SpO2: 100% 100%    Last Pain:  Vitals:   05/25/19 1415  TempSrc: Oral  PainSc: Asleep                 JOSLIN,DAVID COKER

## 2019-05-25 NOTE — Interval H&P Note (Signed)
History and Physical Interval Note:  05/25/2019 6:42 AM  Matthew Kline  has presented today for surgery, with the diagnosis of Wound Dehiscence Right Below Knee Amputation.  The various methods of treatment have been discussed with the patient and family. After consideration of risks, benefits and other options for treatment, the patient has consented to  Procedure(s): RIGHT ABOVE KNEE AMPUTATION (Right) as a surgical intervention.  The patient's history has been reviewed, patient examined, no change in status, stable for surgery.  I have reviewed the patient's chart and labs.  Questions were answered to the patient's satisfaction.     [name]

## 2019-05-25 NOTE — Progress Notes (Signed)
   Subjective: Ptis a 59-yr-oldmanwith a PMH ofESRD, pancreatic insufficiency, HTN,T2DM, and bilateral LE amputations who presented to the ED withworsening pain, erythema, and purulent discharge at his RLE BKA site. No overnight events. Pt underwent right AKA surgery this morning. He is more distressed than usual. Pt repeatedly asked "what [he had] done to deserve this" and could not be consoled. Called out for a person named Thayer Headings a few times. Not responsive to questions.  Objective:  Vital signs in last 24 hours: Vitals:   05/25/19 1040 05/25/19 1055 05/25/19 1110 05/25/19 1125  BP:  123/71 120/70 129/72  Pulse:  65 65 66  Resp:  12 12 16   Temp: 97.6 F (36.4 C)   97.7 F (36.5 C)  TempSrc:      SpO2:  100% 100% 100%  Weight:      Height:       General: Pt is chronically ill-appearing and very anxious HEENT: [place]/AT. Grossly EOMI Cardiac: External chest is normal in appearance without lifts, heaves, or thrills Respiratory: Pt is in no respiratory distress GI: Abdomen is soft Neuro: Pt is not oriented to place or time Extremities: BL AKA for lower extremities Skin: Deferred due to pt distress  Assessment/Plan:  Active Problems:   PVD (peripheral vascular disease) (HCC)   ESRD on hemodialysis (HCC)   Cellulitis and abscess of leg   Severe protein-calorie malnutrition (HCC)   Cellulitis   Cellulitis of right leg   Dehiscence of amputation stump (HCC)   Abscess of right leg  Ptis a 59-yr-oldmanwith a PMH ofESRD, pancreatic insufficiency, HTN,T2DM, and bilateral LE amputations who has purulent cellulitis not improving on antibiotics. Right AKA performed today. Pt has AMS, more anxious than usual.  P/s right AKA amputation Purulentcellulitis: Pt received cefazolin for surgical prophylaxis. Vancomycin will be resumed due to abscess found during previous revision of right BKA. Pt is delirious and very anxious about his infection and amputation. Difficult to  communicate with pt, not responsive to questions. Pt's son was called and asked to visit to put the pt more at ease. Son plans to visit him today. -Resume IV vancomycin -IV hydromorphone 0.5-1 mg q3h PRN -Oxycodone 5-10 mg q4h PRN -Monitor CBC daily  AMS: Pt is not oriented to place or time. More distressed than usual -Continue hemodialysis -Monitor for alertness  ESRD  Chronic Anemia: Potassium3.5. BUN 21, Cr 4.10.Nephro tocontinue MWF dialysis schedule -MWF dialysis -Renal function panel  PVD: Right AKA performed today.  Pancreatic insufficiency: -Creon72,000 units TID  HTN: -Amlodipine5 mg QD -Carvedilol12.5 mg BID  T2DM: -SSI   FEN/GI: -0.9% NaCl infusion 50 mL/hr -Feeding supplement liquid 30 mL BID  -Multivitamin 1 tablet QD  DVT prophylaxis: -IV heparin5000 unitsq8hSQ  CODE STATUS: FULL  Prior to Admission Living Arrangement: Home Anticipated Discharge Location: Home Barriers to Discharge: None Dispo: Anticipated discharge in greater than [place], Medical Student 05/25/2019, 11:36 AM Pager: [phone]

## 2019-05-25 NOTE — Progress Notes (Addendum)
PHARMACY NOTE:  ANTIMICROBIAL RENAL DOSAGE ADJUSTMENT  Current antimicrobial regimen includes a mismatch between antimicrobial dosage and estimated renal function.  As per policy approved by the Pharmacy & Therapeutics and Medical Executive Committees, the antimicrobial dosage will be adjusted accordingly.  Current antimicrobial dosage:  Cefazolin 1g IV q6 x3  Indication: post op surgical prophylaxis. Pt already on vanc. D/w Dr Sharol Given about length of therapy post AKA. Dr Sharol Given would like 3d of vanc post op.   Renal Function:  Estimated Creatinine Clearance: 16.3 mL/min (A) (by C-G formula based on SCr of 4.1 mg/dL (H)). [x]      On intermittent HD, scheduled: []      On CRRT    Antimicrobial dosage has been changed to:  Vanc 3 days post AKA  Additional comments:  Onnie Boer, PharmD, BCIDP, AAHIVP, CPP Infectious Disease Pharmacist 05/25/2019 12:15 PM

## 2019-05-25 NOTE — Transfer of Care (Signed)
Immediate Anesthesia Transfer of Care Note  Patient: Matthew Kline  Procedure(s) Performed: RIGHT ABOVE KNEE AMPUTATION (Right Knee)  Patient Location: PACU  Anesthesia Type:General  Level of Consciousness: sedated  Airway & Oxygen Therapy: Patient Spontanous Breathing and Patient connected to face mask oxygen  Post-op Assessment: Report given to RN and Post -op Vital signs reviewed and stable  Post vital signs: Reviewed and stable  Last Vitals:  Vitals Value Taken Time  BP 112/67 05/25/19 1039  Temp    Pulse 60 05/25/19 1045  Resp 12 05/25/19 1045  SpO2 100 % 05/25/19 1045  Vitals shown include unvalidated device data.  Last Pain:  Vitals:   05/25/19 0858  TempSrc:   PainSc: 0-No pain      Patients Stated Pain Goal: 0 (81/01/75 1025)  Complications: No apparent anesthesia complications

## 2019-05-25 NOTE — Progress Notes (Signed)
[facility] Progress Note   Subjective:   Patient seen and examined at bedside in room.  About to go for amputation.  Denies SOB, CP, weakness, dizziness and fatigue.  Objective Vitals:   05/24/19 1558 05/24/19 2126 05/25/19 0424 05/25/19 0858  BP: 129/81 126/69 126/72 140/64  Pulse: 76 76 80 80  Resp: 16 12 20    Temp: 98.9 F (37.2 C) 98.3 F (36.8 C) 98.5 F (36.9 C)   TempSrc: Oral Oral Oral   SpO2: 94% 90% 98% 96%  Weight:  60 kg    Height:       Physical Exam General:Chronically ill appearing, confused, thin male Heart:RRR Lungs:CTAB, nml WOB Abdomen:soft, NTND Extremities:L AKA, R BKA dressed, +tenderness on R Dialysis Access: LU AVF +b  Filed Weights   05/24/19 0112 05/24/19 0531 05/24/19 2126  Weight: 61.3 kg 60.3 kg 60 kg    Intake/Output Summary (Last 24 hours) at 05/25/2019 0929 Last data filed at 05/25/2019 0800 Gross per 24 hour  Intake 60 ml  Output 0 ml  Net 60 ml    Additional Objective Labs: Basic Metabolic Panel: Recent Labs  Lab 05/23/19 0541 05/24/19 0659 05/25/19 0530  NA 136 136 136  K 4.0 3.1* 3.5  CL 95* 99 98  CO2 26 28 26   GLUCOSE 96 80 91  BUN 30* 9 21*  CREATININE 5.56* 2.74* 4.10*  CALCIUM 9.0 7.8* 8.7*  PHOS 3.9 2.5 3.6   Liver Function Tests: Recent Labs  Lab 05/21/19 0739 05/22/19 0918 05/23/19 0541 05/24/19 0659 05/25/19 0530  AST 17  --   --   --   --   ALT 12  --   --   --   --   ALKPHOS 119  --   --   --   --   BILITOT 1.5*  --   --   --   --   PROT 7.5  --   --   --   --   ALBUMIN 2.5*   < > 2.1* 2.0* 2.0*   < > = values in this interval not displayed.   CBC: Recent Labs  Lab 05/21/19 0739 05/21/19 0739 05/22/19 0301 05/22/19 0301 05/23/19 0541 05/24/19 0659 05/25/19 0530  WBC 11.8*   < > 12.9*   < > 12.3* 11.6* 9.9  NEUTROABS 9.8*  --   --   --  10.7*  --   --   HGB 11.2*   < > 10.2*   < > 10.1* 9.6* 10.0*  HCT 35.7*   < > 32.0*   < > 31.5* 30.4* 31.6*  MCV 95.5  --  93.3  --   92.9 93.3 93.8  PLT 149*   < > 128*   < > 117* 105* 109*   < > = values in this interval not displayed.   CBG: Recent Labs  Lab 05/24/19 1131 05/24/19 1642 05/24/19 2123 05/25/19 0652 05/25/19 0825  GLUCAP 100* 86 91 79 78    Medications: . sodium chloride    . sodium chloride 10 mL/hr at 05/25/19 0855  .  ceFAZolin (ANCEF) IV    . [MAR Hold] vancomycin 750 mg (05/24/19 0442)   . [MAR Hold] amLODipine  5 mg Oral QPM  . [MAR Hold] aspirin EC  81 mg Oral QHS  . [MAR Hold] carvedilol  12.5 mg Oral BID WC  . [MAR Hold] Chlorhexidine Gluconate Cloth  6 each Topical Q0600  . [MAR Hold] Chlorhexidine Gluconate Cloth  6 each Topical Daily  . [MAR Hold] darbepoetin (ARANESP) injection - DIALYSIS  60 mcg Intravenous Q Wed-HD  . [MAR Hold] docusate sodium  100 mg Oral BID  . [MAR Hold] feeding supplement (NEPRO CARB STEADY)  237 mL Oral TID BM  . [MAR Hold] feeding supplement (PRO-STAT SUGAR FREE 64)  30 mL Oral BID  . [MAR Hold] gabapentin  200 mg Oral Daily  . [MAR Hold] heparin  5,000 Units Subcutaneous Q8H  . [MAR Hold] insulin aspart  0-5 Units Subcutaneous QHS  . [MAR Hold] insulin aspart  0-6 Units Subcutaneous TID WC  . [MAR Hold] lipase/protease/amylase  72,000 Units Oral TID with meals  . [MAR Hold] multivitamin  1 tablet Oral QHS  . [MAR Hold] mupirocin ointment  1 application Nasal BID  . [MAR Hold] nutrition supplement (JUVEN)  1 packet Oral BID BM  . [MAR Hold] sertraline  100 mg Oral Daily  . [MAR Hold] sevelamer carbonate  2,400 mg Oral TID WC    [facility], BFR 400, DFR Auto 1.5, EDW 63.5kg, 2K/2Ca Heparin 4000 unit bolus Venofer 100mg  IV q HD- received 5/10 doses Mircera 100 mcg q 2 weeks- last dose 4/23  Assessment/Plan: 1. R stump cellulitis:WBC 9.9.Afebrile. WC +MSSAand BC- NGTD. R tib/fib Xrayw/soft tissue swelling and no abscess. Onvancomycin.Large abscess with necrosis of gastrocnemius  muscle & tendinous attachemnt found during revision and debridement.  Plan for AKA today. 2. AMS - likely d/t infection, +/-meds. Missed 1 week of HD, but now has had 2 treatments with little improvement in AMS. Continue HD per regular schedule. 3. ESRD:on HD MWF. K3.5, BUN21, +asterixis.HD today post surgery. 4. Hypertension/volume:BP well controlled, euvolemic on exam, under EDW by weights.Titrate down volume as tolerated. Will need new EDW on d/c. Plan for UF goal 1-2L today. 5. Anemia:Hgb10.0.Aranesp 29mcggiven thurs. Holding IV Fe in setting of acute infection. Patient denies any recent blood loss.  Anticipate drop in Hgb post surgery. 6. Metabolic bone disease:Calcium and phos at goal.Continue renvela. 7. Nutrition:Alb 2.0.Renal diet/fluid restrictions. On protein supplements as outpatient- will order prostat   Jen Mow, PA-C [facility] Pager: [phone] 05/25/2019,9:29 AM  LOS: 4 days

## 2019-05-25 NOTE — Progress Notes (Signed)
Physical Therapy Treatment Patient Details Name: Matthew Kline MRN: [id number] DOB: 09-Apr-1960 Today's Date: 05/25/2019    History of Present Illness Matthew Kline is a 59 y.o. male who presents with diabetic insensate neuropathy severe peripheral vascular disease end-stage renal disease on dialysis with severe protein caloric malnutrition.  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  Patient reports a several week acute history of increasing pain and drainage from the right below the knee amputation. Pain and purulent drainage right transtibial amputation. Now s/p transtibial amputation (05/23/19) and above-the-knee amputation 5/14    PT Comments    Pt in bed today following R AKA this morning, agreeable to PT session focused on re-assessing pt capacity for bed mobility following new amputation. The pt continues to present with significant deficits in functional mobility, transfers, posture and trunk control, and static/dynamic stability. The pt was able to complete multiple rolls in the bed with extra time, multimodal cues, and heavy use of bed rail to complete, and was able to tolerate long-sitting for ~5 min with BUE support, but was tearful and anxious with transition to and from long-sitting despite max encouragement and offered assist from PT. The pt's session and mobility is further complicated by significant confusion today, the pt remained tearful and tangential throughout the session despite attempts to re-orient, comfort, and answer pt questions/concerns. The pt will continue to benefit from skilled PT to further progress functional mobility and stability to improve safety and independence with mobility prior to d/c, but will need SNF-level rehab and assistance prior to d/c home.     Follow Up Recommendations  SNF     Equipment Recommendations  (defer to post acute)    Recommendations for Other Services       Precautions /  Restrictions Precautions Precautions: Fall Precaution Comments: B AKA Restrictions Weight Bearing Restrictions: Yes RLE Weight Bearing: Non weight bearing Other Position/Activity Restrictions: L AKA ~59 y/o according to pt    Mobility  Bed Mobility Overal bed mobility: Needs Assistance Bed Mobility: Rolling;Supine to Sit;Sit to Supine Rolling: Mod assist   Supine to sit: Total assist;HOB elevated Sit to supine: Total assist;HOB elevated   General bed mobility comments: able to complete bilateral roll with modA and heavy use of rails. max encouragement. less pain when rolling to L. significant cues for hand positioning. Then used elevated HOB and pt grasp on hand rails to transition to long-sittingin bed. pt fearful but unable to verbalize why when lying down from longsitting to elevated HOB.  Transfers                 General transfer comment: deferred  Ambulation/Gait                 Stairs             Wheelchair Mobility    Modified Rankin (Stroke Patients Only)       Balance Overall balance assessment: Needs assistance Sitting-balance support: Bilateral upper extremity supported Sitting balance-Leahy Scale: Poor   Postural control: Posterior lean;Right lateral lean                                  Cognition Arousal/Alertness: Awake/alert Behavior During Therapy: Flat affect;Anxious(and tearful) Overall Cognitive Status: Impaired/Different from baseline Area of Impairment: Orientation;Attention;Following commands;Awareness;Problem solving;Memory                 Orientation Level: Disoriented  to;Situation;Place;Time Current Attention Level: Focused Memory: Decreased short-term memory(had to re-orient pt to situation and my role mulitple times through session) Following Commands: Follows one step commands inconsistently;Follows one step commands with increased time(and multimodal cues)   Awareness: Intellectual Problem  Solving: Slow processing;Decreased initiation;Difficulty sequencing;Requires verbal cues;Requires tactile cues General Comments: significant confusion may be due to pain medicine from surgery today, pt tearful and tangential in speech. perseverating on his past "lies" and being an "ugly" person, attempted to comfort and re-orient      Exercises      General Comments General comments (skin integrity, edema, etc.): pt very confused upon PT arrival, responding well to encouragement but tearful and perseverating on "past lies" and stating that he is "an ugly person" throughout session so PT provided comfort, encouragement, and attempted to re-orient. Discussed stages of grief following amputation, pt will need reinforcement.      Pertinent Vitals/Pain Pain Assessment: Faces Faces Pain Scale: Hurts whole lot Pain Location: R LE with movement Pain Descriptors / Indicators: Grimacing;Guarding;Moaning;Crying Pain Intervention(s): Limited activity within patient's tolerance;Monitored during session;Repositioned    Home Living                      Prior Function            PT Goals (current goals can now be found in the care plan section) Acute Rehab PT Goals Patient Stated Goal: pt unable to state PT Goal Formulation: Patient unable to participate in goal setting Time For Goal Achievement: 06/06/19 Potential to Achieve Goals: Fair Progress towards PT goals: Progressing toward goals    Frequency    Min 2X/week      PT Plan Current plan remains appropriate    Co-evaluation              AM-PAC PT "6 Clicks" Mobility   Outcome Measure  Help needed turning from your back to your side while in a flat bed without using bedrails?: A Lot Help needed moving from lying on your back to sitting on the side of a flat bed without using bedrails?: Total Help needed moving to and from a bed to a chair (including a wheelchair)?: Total Help needed standing up from a chair using  your arms (e.g., wheelchair or bedside chair)?: Total Help needed to walk in hospital room?: Total Help needed climbing 3-5 steps with a railing? : Total 6 Click Score: 7    End of Session Equipment Utilized During Treatment: (none today) Activity Tolerance: Patient limited by pain;Other (comment)(confusion, difficulty with command following)   Nurse Communication: Mobility status PT Visit Diagnosis: Other abnormalities of gait and mobility (R26.89);Pain Pain - Right/Left: Right Pain - part of body: Leg     Time: 9758-8325 PT Time Calculation (min) (ACUTE ONLY): 35 min  Charges:  $Therapeutic Activity: 23-37 mins                     Karma Ganja, PT, DPT   Acute Rehabilitation Department Pager #: [phone]   Otho Bellows 05/25/2019, 3:29 PM

## 2019-05-25 NOTE — Op Note (Signed)
05/25/2019  10:48 AM  PATIENT:  Matthew Kline    PRE-OPERATIVE DIAGNOSIS:  Wound Dehiscence Right Below Knee Amputation  POST-OPERATIVE DIAGNOSIS:  Same  PROCEDURE:  RIGHT ABOVE KNEE AMPUTATION Application of Prevena customizable and Arthur form wound VAC  SURGEON:  [name], MD  PHYSICIAN ASSISTANT:None ANESTHESIA:   General  PREOPERATIVE INDICATIONS:  [name] is a  59 y.o. male with a diagnosis of Wound Dehiscence Right Below Knee Amputation who failed conservative measures and elected for surgical management.    The risks benefits and alternatives were discussed with the patient preoperatively including but not limited to the risks of infection, bleeding, nerve injury, cardiopulmonary complications, the need for revision surgery, among others, and the patient was willing to proceed.  OPERATIVE IMPLANTS: Praveena customizable and Arthur form wound VAC  @ENCIMAGES @  OPERATIVE FINDINGS: Margins clean no abscess extending into the above the knee amputation site.  Severe calcified vessels.  Muscle viable and contractile.  OPERATIVE PROCEDURE: Patient was brought the operating room and underwent a general anesthetic.  After adequate levels anesthesia were obtained patient's right lower extremity was prepped using DuraPrep draped into a sterile field a timeout was called.  The dehiscence of the below the knee amputation was wrapped out of the sterile field with impervious stockinette.  A fishmouth incision was made through the mid thigh.  The vessels were calcified and multiple vessels need to be clamped and suture ligated with 2-0 silk.  The femoral vessels were also clamped and suture ligated with 2-0 silk.  A reciprocating saw was used to complete the transfemoral amputation.  The wound was irrigated normal saline electrocautery was used for further hemostasis.  The deep fascial layer was closed using #1 Vicryl the skin was closed using 2-0 nylon a customizable and  Arthur form wound VAC dressing was applied this had a good suction fit patient was extubated taken the PACU in stable condition.   DISCHARGE PLANNING:  Antibiotic duration: Continue IV antibiotics  Weightbearing: Nonweightbearing bilaterally  Pain medication: Opioid pathway  Dressing care/ Wound VAC: Wound VAC remain in place for 1 week  Ambulatory devices: Not applicable  Discharge to: Discharge to skilled nursing  Follow-up: In the office 1 week post operative.

## 2019-05-25 NOTE — Anesthesia Preprocedure Evaluation (Signed)
Anesthesia Evaluation  Patient identified by MRN, date of birth, ID band Patient awake    Reviewed: Allergy & Precautions, NPO status , Patient's Chart, lab work & pertinent test results  Airway Mallampati: II  TM Distance: >3 FB Neck ROM: Full    Dental  (+) Teeth Intact, Poor Dentition, Dental Advisory Given   Pulmonary former smoker,    breath sounds clear to auscultation       Cardiovascular hypertension,  Rhythm:Regular Rate:Normal     Neuro/Psych    GI/Hepatic   Endo/Other  diabetes  Renal/GU      Musculoskeletal   Abdominal   Peds  Hematology   Anesthesia Other Findings   Reproductive/Obstetrics                             Anesthesia Physical Anesthesia Plan  ASA: III  Anesthesia Plan: General   Post-op Pain Management:    Induction: Intravenous  PONV Risk Score and Plan: Ondansetron  Airway Management Planned: LMA  Additional Equipment:   Intra-op Plan:   Post-operative Plan:   Informed Consent: I have reviewed the patients History and Physical, chart, labs and discussed the procedure including the risks, benefits and alternatives for the proposed anesthesia with the patient or authorized representative who has indicated his/her understanding and acceptance.     Dental advisory given  Plan Discussed with: CRNA and Anesthesiologist  Anesthesia Plan Comments:         Anesthesia Quick Evaluation

## 2019-05-26 ENCOUNTER — Inpatient Hospital Stay (HOSPITAL_COMMUNITY): Payer: Medicare HMO

## 2019-05-26 LAB — CBC
HCT: 21.9 % — ABNORMAL LOW (ref 39.0–52.0)
Hemoglobin: 7.1 g/dL — ABNORMAL LOW (ref 13.0–17.0)
MCH: 29.8 pg (ref 26.0–34.0)
MCHC: 32.4 g/dL (ref 30.0–36.0)
MCV: 92 fL (ref 80.0–100.0)
Platelets: 127 10*3/uL — ABNORMAL LOW (ref 150–400)
RBC: 2.38 MIL/uL — ABNORMAL LOW (ref 4.22–5.81)
RDW: 16.7 % — ABNORMAL HIGH (ref 11.5–15.5)
WBC: 9.2 10*3/uL (ref 4.0–10.5)
nRBC: 0 % (ref 0.0–0.2)

## 2019-05-26 LAB — RENAL FUNCTION PANEL
Albumin: 1.6 g/dL — ABNORMAL LOW (ref 3.5–5.0)
Anion gap: 12 (ref 5–15)
BUN: 30 mg/dL — ABNORMAL HIGH (ref 6–20)
CO2: 25 mmol/L (ref 22–32)
Calcium: 8.3 mg/dL — ABNORMAL LOW (ref 8.9–10.3)
Chloride: 100 mmol/L (ref 98–111)
Creatinine, Ser: 4.93 mg/dL — ABNORMAL HIGH (ref 0.61–1.24)
GFR calc Af Amer: 14 mL/min — ABNORMAL LOW (ref >60)
GFR calc non Af Amer: 12 mL/min — ABNORMAL LOW (ref >60)
Glucose, Bld: 124 mg/dL — ABNORMAL HIGH (ref 70–99)
Phosphorus: 4.2 mg/dL (ref 2.5–4.6)
Potassium: 3.4 mmol/L — ABNORMAL LOW (ref 3.5–5.1)
Sodium: 137 mmol/L (ref 135–145)

## 2019-05-26 LAB — CULTURE, BLOOD (ROUTINE X 2)
Culture: NO GROWTH
Culture: NO GROWTH
Special Requests: ADEQUATE

## 2019-05-26 LAB — GLUCOSE, CAPILLARY
Glucose-Capillary: 77 mg/dL (ref 70–99)
Glucose-Capillary: 68 mg/dL — ABNORMAL LOW (ref 70–99)
Glucose-Capillary: 83 mg/dL (ref 70–99)
Glucose-Capillary: 163 mg/dL — ABNORMAL HIGH (ref 70–99)
Glucose-Capillary: 186 mg/dL — ABNORMAL HIGH (ref 70–99)

## 2019-05-26 LAB — HEMOGLOBIN AND HEMATOCRIT, BLOOD
HCT: 25.4 % — ABNORMAL LOW (ref 39.0–52.0)
Hemoglobin: 8.4 g/dL — ABNORMAL LOW (ref 13.0–17.0)

## 2019-05-26 LAB — PREPARE RBC (CROSSMATCH)

## 2019-05-26 MED ORDER — VANCOMYCIN HCL IN DEXTROSE 750-5 MG/150ML-% IV SOLN: 750.0000 mg | Freq: Once | INTRAVENOUS | Status: DC

## 2019-05-26 MED ORDER — PENTAFLUOROPROP-TETRAFLUOROETH EX AERO: 1.0000 "application " | INHALATION_SPRAY | CUTANEOUS | Status: DC | PRN

## 2019-05-26 MED ORDER — SODIUM CHLORIDE 0.9 % IV SOLN: 100.0000 mL | INTRAVENOUS | Status: DC | PRN

## 2019-05-26 MED ORDER — HYDROMORPHONE HCL 1 MG/ML IJ SOLN
INTRAMUSCULAR | Status: AC
  Filled 2019-05-26: qty 1

## 2019-05-26 MED ORDER — HEPARIN SODIUM (PORCINE) 1000 UNIT/ML DIALYSIS: 1000.0000 [IU] | INTRAMUSCULAR | Status: DC | PRN

## 2019-05-26 MED ORDER — VANCOMYCIN HCL IN DEXTROSE 750-5 MG/150ML-% IV SOLN
750.0000 mg | Freq: Once | INTRAVENOUS | Status: AC
  Administered 2019-05-26: 750 mg via INTRAVENOUS
  Filled 2019-05-26: qty 150

## 2019-05-26 MED ORDER — LIDOCAINE-PRILOCAINE 2.5-2.5 % EX CREA: 1.0000 "application " | TOPICAL_CREAM | CUTANEOUS | Status: DC | PRN

## 2019-05-26 MED ORDER — ALTEPLASE 2 MG IJ SOLR: 2.0000 mg | Freq: Once | INTRAMUSCULAR | Status: DC | PRN

## 2019-05-26 MED ORDER — LIDOCAINE HCL (PF) 1 % IJ SOLN: 5.0000 mL | INTRAMUSCULAR | Status: DC | PRN

## 2019-05-26 NOTE — Progress Notes (Signed)
[name], male   DOB: 01-01-1961, 59 y.o.   MRN: [id number] Patient is very emotional this morning.  He states that he is dead and that he has never listen to any medical advice.  There is no drainage in the wound VAC canister.  He is in dialysis and still having problems with diarrhea.  Anticipate discharge to skilled nursing.

## 2019-05-26 NOTE — Progress Notes (Addendum)
Pt is very worried and confused. He feels like he has "said some things" to upset the medical team to where he feels like he is being punished. It is very hard to understand where pt is going with things because he will say he will do one thing such as take his medications because he is hurting, but then turn around and say that we are trying to harm him with the meds. RN spent a lot of time with pt trying to reassure him that he is safe here and that no one is trying to punish him or hurt him in any way. Rn explained what each medication was for several times before pt would take medication. Pt finally calmed down, but states he has a lot of questions that he wants answered by the MD. Pt is very confused. RN will continue to monitor.   Eleanora Neighbor, RN

## 2019-05-26 NOTE — Progress Notes (Signed)
OT Cancellation Note  Patient Details Name: Kowen Kluth MRN: [id number] DOB: Sep 25, 1960   Cancelled Treatment:      Pt. Had HD and when he got back he needed blood. Treatment cancelled for today. HORVATH,STEPHANIE 05/26/2019, 2:07 PM

## 2019-05-26 NOTE — Progress Notes (Signed)
Notified by dialysis RN that Hgb dropped 10 -> 7.1 (s/p AKA revision surgery yesterday). Will order 1U PRBCs. May be able to give with dialysis if get the blood in time - only has 1 hour left on HD. If not, can be given by floor nurse.  Of note, has been intermittently agitated on HD - intermittently screaming, then calming down.   Veneta Penton, PA-C [employer] [employer] Pager [phone]

## 2019-05-26 NOTE — Progress Notes (Signed)
  Date: 05/26/2019  Patient name: Matthew Kline  Medical record number: [id number]  Date of birth: 11-30-1960   This patient's plan of care was discussed with the house staff. Please see their note for complete details. I concur with their findings.  Head CT negative for any acute finding to explain prolonged delirium.    Sid Falcon, MD 05/26/2019, 9:52 PM

## 2019-05-26 NOTE — Plan of Care (Signed)
  Problem: Education: Goal: Knowledge of General Education information will improve Description Including pain rating scale, medication(s)/side effects and non-pharmacologic comfort measures Outcome: Progressing   

## 2019-05-26 NOTE — Progress Notes (Signed)
[facility] Progress Note   Subjective:   Seen at onset of dialysis - 2L UFG and tolerating. He is tearful today, frustrated with current state. Says "I'm feeling sorry for myself today." Ongoing diarrhea - being cleaned up now. No CP, dyspnea.  Objective Vitals:   05/25/19 1214 05/25/19 1415 05/25/19 2049 05/26/19 0427  BP: 121/75 115/82 128/69 (!) 134/104  Pulse: 69 75 73 73  Resp: 18 18 18 14   Temp: 97.8 F (36.6 C) 98.2 F (36.8 C) 98.2 F (36.8 C) 98.2 F (36.8 C)  TempSrc: Oral Oral Oral Oral  SpO2: 100% 100% 99% 93%  Weight:   57.1 kg   Height:       Physical Exam General: Chronically ill appearing man, frail  Heart: RRR; no murmur Lungs: CTA anteriorly Abdomen: soft, non-tender Extremities: B AKA now; R bandaged Dialysis Access: L AVF + thrill  Additional Objective Labs: Basic Metabolic Panel: Recent Labs  Lab 05/23/19 0541 05/24/19 0659 05/25/19 0530  NA 136 136 136  K 4.0 3.1* 3.5  CL 95* 99 98  CO2 26 28 26   GLUCOSE 96 80 91  BUN 30* 9 21*  CREATININE 5.56* 2.74* 4.10*  CALCIUM 9.0 7.8* 8.7*  PHOS 3.9 2.5 3.6   Liver Function Tests: Recent Labs  Lab 05/21/19 0739 05/22/19 0918 05/23/19 0541 05/24/19 0659 05/25/19 0530  AST 17  --   --   --   --   ALT 12  --   --   --   --   ALKPHOS 119  --   --   --   --   BILITOT 1.5*  --   --   --   --   PROT 7.5  --   --   --   --   ALBUMIN 2.5*   < > 2.1* 2.0* 2.0*   < > = values in this interval not displayed.   CBC: Recent Labs  Lab 05/21/19 0739 05/21/19 0739 05/22/19 0301 05/22/19 0301 05/23/19 0541 05/24/19 0659 05/25/19 0530  WBC 11.8*   < > 12.9*   < > 12.3* 11.6* 9.9  NEUTROABS 9.8*  --   --   --  10.7*  --   --   HGB 11.2*   < > 10.2*   < > 10.1* 9.6* 10.0*  HCT 35.7*   < > 32.0*   < > 31.5* 30.4* 31.6*  MCV 95.5  --  93.3  --  92.9 93.3 93.8  PLT 149*   < > 128*   < > 117* 105* 109*   < > = values in this interval not displayed.   Medications: . sodium chloride     . sodium chloride 50 mL/hr at 05/25/19 1222  . sodium chloride    . sodium chloride     . amLODipine  5 mg Oral QPM  . aspirin EC  81 mg Oral QHS  . carvedilol  12.5 mg Oral BID WC  . Chlorhexidine Gluconate Cloth  6 each Topical Q0600  . Chlorhexidine Gluconate Cloth  6 each Topical Daily  . darbepoetin (ARANESP) injection - DIALYSIS  60 mcg Intravenous Q Wed-HD  . docusate sodium  100 mg Oral BID  . feeding supplement (NEPRO CARB STEADY)  237 mL Oral TID BM  . feeding supplement (PRO-STAT SUGAR FREE 64)  30 mL Oral BID  . gabapentin  200 mg Oral Daily  . heparin  5,000 Units Subcutaneous Q8H  . insulin aspart  0-5 Units Subcutaneous QHS  . insulin aspart  0-6 Units Subcutaneous TID WC  . lipase/protease/amylase  72,000 Units Oral TID with meals  . multivitamin  1 tablet Oral QHS  . mupirocin ointment  1 application Nasal BID  . nutrition supplement (JUVEN)  1 packet Oral BID BM  . sertraline  100 mg Oral Daily  . sevelamer carbonate  2,400 mg Oral TID WC    [facility], BFR 400, DFR Auto 1.5, EDW 63.5kg, 2K/2Ca Heparin 4000 unit bolus Venofer 100mg  IV q HD- received 5/10 doses Mircera 100 mcg q 2 weeks- last dose 4/23  Assessment/Plan: 1. R stump cellulitis:Wound Cx grew MSSA, BCx negative - on Vancomycin. Large abscess with necrosis of gastrocnemius muscle &tendinous attachemnt found during revision and debridement -> now s/p R AKA (5/14). 2.  AMS: Improved, presumed d/t infection and missed HD prior to admit.  3. ESRD:HD today (postponed from yesterday d/t OR) - then back on MWF schedule.  4. Hypertension/volume:BP stable, below prior EDW. Will lower on discharge. 5. Anemia:Hgb 10 yesterday - today's labs pending - anticipate drop. Getting Aranesp q Wed. 6. Metabolic bone disease:Calcium and phos at goal.Continue renvela. 7. Nutrition:Alb low. Continue Nepro + pro-stat supplements. 8. Diarrhea:  Chronic issue, pancreatic insufficiency. On Creon. 9. Situational Depression: Follow.  Veneta Penton, PA-C 05/26/2019, 8:06 AM  [employer]

## 2019-05-26 NOTE — Progress Notes (Signed)
Pt anxious, restless and agitated throughout most of HD treatment. Pt tearful with mood lability. Stating that he is already dead and asking "did I do this to myself?" Yelling out throughout tx with nonsensical statements. Pt observed grabbing surgical site and yelling out. Pt given dilaudid 1mg  IV x1. Pt somewhat calmer after medication administration. Pt also had a large loose stool in am.

## 2019-05-26 NOTE — Progress Notes (Signed)
   Subjective: No acute events overnight.  Mr. Vanwieren was seen while receiving HD this morning. He is still quite confused. Per dialysis nurse, he had a large BM and was crying, yelling out. He is calm on my evaluation. Does not indicate he is in any pain.   Objective:  Vital signs in last 24 hours: Vitals:   05/26/19 1200 05/26/19 1308 05/26/19 1346 05/26/19 1617  BP: 120/68 130/75 134/71 (!) 141/71  Pulse: 72 78 79 84  Resp: 14 14 14 16   Temp: 98.2 F (36.8 C) (!) 97.5 F (36.4 C) 98.8 F (37.1 C) 98.7 F (37.1 C)  TempSrc: Oral Oral Oral Oral  SpO2: 98% 100% 100% 100%  Weight: 53.8 kg     Height:       General: chronically ill-appearing CV: RRR MSK: bilateral AKAs Neuro: disoriented, does not answer questions appropriately    Assessment/Plan:  Principal Problem:   Dehiscence of amputation stump (HCC) Active Problems:   PVD (peripheral vascular disease) (HCC)   ESRD on hemodialysis (HCC)   Cellulitis and abscess of leg   Severe protein-calorie malnutrition (HCC)   Cellulitis   Cellulitis of right leg   Abscess of right leg  Ptis a 59-yr-oldmanwith a PMH ofESRD, pancreatic insufficiency, HTN,T2DM, and bilateral LE amputations who has purulent cellulitis not improving on antibiotics. Right AKA performed today. Pt has AMS, more anxious than usual.  P/s right AKA amputation Purulentcellulitis:  -POD 2; pain well controlled -continue IV vancomycin -IV hydromorphone 0.5-1 mg q3h PRN -Oxycodone 5-10 mg q4h PRN -wound vac will remain in place for 1 week -continue PT/OT efforts; anticipate SNF  Encephalopathy: initially thought to have been related to infection, missing several HD sessions. However, patient has remained persistently disoriented despite source control with AKA and getting HD. Per son, patient is more oriented at baseline. No focal neurologic deficits. May be related to hospital delirium, but will pursue CT head to r/o any structural cause.    -delirium precautions -frequent redirection   ESRD  Acute on Chronic Anemia: -appreciate nephrology managing inpatient HD -hgb trended down from 10>7 in the setting of acute blood loss from surgery; received 1 unit while in HD today -trend renal function panel, CBC   Pancreatic insufficiency: -Creon72,000 units TID  HTN: -Amlodipine5 mg QD -Carvedilol12.5 mg BID  T2DM: -SSI   Prior to Admission Living Arrangement: home Anticipated Discharge Location: SNF Barriers to Discharge: continued medical work-up  Dispo: Anticipated discharge in approximately 2-3 day(s).   Modena Nunnery D, DO 05/26/2019, 6:25 PM Pager: [phone]

## 2019-05-27 LAB — BPAM RBC
Blood Product Expiration Date: 202105302359
ISSUE DATE / TIME: 202105151319
Unit Type and Rh: 9500

## 2019-05-27 LAB — CBC
HCT: 24.2 % — ABNORMAL LOW (ref 39.0–52.0)
Hemoglobin: 7.9 g/dL — ABNORMAL LOW (ref 13.0–17.0)
MCH: 30.4 pg (ref 26.0–34.0)
MCHC: 32.6 g/dL (ref 30.0–36.0)
MCV: 93.1 fL (ref 80.0–100.0)
Platelets: 128 10*3/uL — ABNORMAL LOW (ref 150–400)
RBC: 2.6 MIL/uL — ABNORMAL LOW (ref 4.22–5.81)
RDW: 16.2 % — ABNORMAL HIGH (ref 11.5–15.5)
WBC: 9.4 10*3/uL (ref 4.0–10.5)
nRBC: 0 % (ref 0.0–0.2)

## 2019-05-27 LAB — RENAL FUNCTION PANEL
Albumin: 1.7 g/dL — ABNORMAL LOW (ref 3.5–5.0)
Anion gap: 7 (ref 5–15)
BUN: 18 mg/dL (ref 6–20)
CO2: 28 mmol/L (ref 22–32)
Calcium: 8.6 mg/dL — ABNORMAL LOW (ref 8.9–10.3)
Chloride: 105 mmol/L (ref 98–111)
Creatinine, Ser: 2.94 mg/dL — ABNORMAL HIGH (ref 0.61–1.24)
GFR calc Af Amer: 26 mL/min — ABNORMAL LOW (ref >60)
GFR calc non Af Amer: 22 mL/min — ABNORMAL LOW (ref >60)
Glucose, Bld: 155 mg/dL — ABNORMAL HIGH (ref 70–99)
Phosphorus: 2.3 mg/dL — ABNORMAL LOW (ref 2.5–4.6)
Potassium: 3.8 mmol/L (ref 3.5–5.1)
Sodium: 140 mmol/L (ref 135–145)

## 2019-05-27 LAB — TYPE AND SCREEN
ABO/RH(D): O NEG
Antibody Screen: NEGATIVE
Unit division: 0

## 2019-05-27 LAB — GLUCOSE, CAPILLARY
Glucose-Capillary: 121 mg/dL — ABNORMAL HIGH (ref 70–99)
Glucose-Capillary: 128 mg/dL — ABNORMAL HIGH (ref 70–99)
Glucose-Capillary: 164 mg/dL — ABNORMAL HIGH (ref 70–99)
Glucose-Capillary: 158 mg/dL — ABNORMAL HIGH (ref 70–99)

## 2019-05-27 MED ORDER — CHLORHEXIDINE GLUCONATE CLOTH 2 % EX PADS
6.0000 | MEDICATED_PAD | Freq: Every day | CUTANEOUS | Status: DC
  Administered 2019-05-27: 6 via TOPICAL

## 2019-05-27 NOTE — Progress Notes (Signed)
  Date: 05/27/2019  Patient name: Matthew Kline  Medical record number: [id number]  Date of birth: 07/11/1960        I have seen and evaluated this patient and I have discussed the plan of care with the house staff. Please see Dr. Janne Napoleon note for complete details. I concur with her findings and plan.    Sid Falcon, MD 05/27/2019, 2:41 PM

## 2019-05-27 NOTE — Evaluation (Signed)
Occupational Therapy Re-Evaluation Patient Details Name: Matthew Kline MRN: [id number] DOB: 1960/05/04 Today's Date: 05/27/2019    History of Present Illness Matthew Kline is a 59 y.o. male who presents with diabetic insensate neuropathy severe peripheral vascular disease end-stage renal disease on dialysis with severe protein caloric malnutrition.  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  Patient reports a several week acute history of increasing pain and drainage from the right below the knee amputation. Pain and purulent drainage right transtibial amputation. Now s/p transtibial amputation (05/23/19) and above-the-knee amputation 5/14   Clinical Impression   Pt is now s/p R AKA. Pt agreeable to re-evaluation. Pt demonstrates cognitive limitations impacting safety and independence with ADL/IADL (see cognition section). He required modA to progress into long-sitting in bed and completed UB bathing while in long-sitting with single UE support and support from therapist. Pt required modA to roll in bed to get onto bed pan. Continue to recommend d/c to SNF with follow-up occupational therapy services to maximize safety and independence with ADL/IADL and functional mobility. Will continue to follow acutely.     Follow Up Recommendations  SNF;Supervision/Assistance - 24 hour    Equipment Recommendations  Other (comment)(defer to next venue)    Recommendations for Other Services       Precautions / Restrictions Precautions Precautions: Fall Precaution Comments: B AKA Restrictions Weight Bearing Restrictions: No RLE Weight Bearing: Non weight bearing LLE Weight Bearing: Non weight bearing Other Position/Activity Restrictions: L AKA ~59 y/o according to pt      Mobility Bed Mobility Overal bed mobility: Needs Assistance Bed Mobility: Rolling;Supine to Sit Rolling: Mod assist   Supine to sit: Mod assist     General bed  mobility comments: modA to progress trunk to long-sitting in bed pt assisted with pulling up on bed rails;modA to roll to get on bed pan  Transfers                 General transfer comment: deferred    Balance Overall balance assessment: Needs assistance Sitting-balance support: Single extremity supported Sitting balance-Leahy Scale: Poor   Postural control: Posterior lean;Right lateral lean                                 ADL either performed or assessed with clinical judgement   ADL Overall ADL's : Needs assistance/impaired Eating/Feeding: Set up;Sitting   Grooming: Set up;Sitting;Bed level Grooming Details (indicate cue type and reason): washed face after setup Upper Body Bathing: Min guard;Sitting Upper Body Bathing Details (indicate cue type and reason): long sitting in bed Lower Body Bathing: Minimal assistance;Sitting/lateral leans   Upper Body Dressing : Minimal assistance;Sitting Upper Body Dressing Details (indicate cue type and reason): long sitting in bed Lower Body Dressing: Moderate assistance Lower Body Dressing Details (indicate cue type and reason): modA to roll Toilet Transfer: Moderate assistance Toilet Transfer Details (indicate cue type and reason): modA to roll to get on bed pan Toileting- Clothing Manipulation and Hygiene: Total assistance       Functional mobility during ADLs: Moderate assistance General ADL Comments: modA to progress into long sitting in bed, pt able to maintain balance with single UE support, completed bathing while sitting upright in bed;modA intermittently for upright posture, pt able to maintain balance with forward flexed posture     Vision         Perception     Praxis  Pertinent Vitals/Pain Pain Assessment: Faces Faces Pain Scale: Hurts whole lot Pain Location: R LE with movement;mid thoracic spine grimacing with touch Pain Descriptors / Indicators: Grimacing;Guarding;Moaning;Crying Pain  Intervention(s): Limited activity within patient's tolerance;Monitored during session     Hand Dominance Right   Extremity/Trunk Assessment Upper Extremity Assessment Upper Extremity Assessment: Generalized weakness;Difficult to assess due to impaired cognition   Lower Extremity Assessment Lower Extremity Assessment: RLE deficits/detail;LLE deficits/detail RLE Deficits / Details: R LE with previous BKA; moves hip and knee spontaneously, very painful RLE: Unable to fully assess due to pain LLE Deficits / Details: Previous AKA   Cervical / Trunk Assessment Cervical / Trunk Assessment: Kyphotic   Communication Communication Communication: No difficulties   Cognition Arousal/Alertness: Awake/alert Behavior During Therapy: Flat affect;Anxious Overall Cognitive Status: Impaired/Different from baseline Area of Impairment: Orientation;Attention;Following commands;Awareness;Problem solving;Memory;Safety/judgement                 Orientation Level: Disoriented to;Place;Person Current Attention Level: Focused Memory: Decreased short-term memory(pt asking if therapist was RN after therapist introduction ) Following Commands: Follows one step commands inconsistently;Follows one step commands with increased time(and multimodal cues) Safety/Judgement: Decreased awareness of deficits Awareness: Intellectual Problem Solving: Slow processing;Decreased initiation;Difficulty sequencing;Requires verbal cues;Requires tactile cues General Comments: pt unable to provide his full legal name, pt repeated "CJ Ames", pt stated he was at [facility] in [place], [place];pt oriented to month and year with increased time and multiple attempts, no cues from therapist;   General Comments       Exercises     Shoulder Instructions      Home Living Family/patient expects to be discharged to:: Private residence Living Arrangements: Alone Available Help at Discharge: Family;Available 24 hours/day Type of  Home: Apartment Home Access: Level entry     Home Layout: One level     Bathroom Shower/Tub: Teacher, early years/pre: Standard Bathroom Accessibility: Yes   Home Equipment: Walker - 2 wheels;Wheelchair - manual;Tub bench(L LE prosthetic)   Additional Comments: information per chart      Prior Functioning/Environment Level of Independence: Independent with assistive device(s)        Comments: information per chart        OT Problem List: Decreased strength;Decreased activity tolerance;Impaired balance (sitting and/or standing);Decreased cognition;Decreased knowledge of use of DME or AE;Pain      OT Treatment/Interventions: Self-care/ADL training;DME and/or AE instruction;Therapeutic activities;Patient/family education;Balance training;Cognitive remediation/compensation    OT Goals(Current goals can be found in the care plan section) Acute Rehab OT Goals Patient Stated Goal: to get clean and wash full body OT Goal Formulation: With patient Time For Goal Achievement: 06/06/19 Potential to Achieve Goals: Fair ADL Goals Additional ADL Goal #1: Pt will demonstrate anticipatory awareness for safe completion of ADL.  OT Frequency: Min 2X/week   Barriers to D/C: Decreased caregiver support          Co-evaluation              AM-PAC OT "6 Clicks" Daily Activity     Outcome Measure Help from another person eating meals?: A Little Help from another person taking care of personal grooming?: A Little Help from another person toileting, which includes using toliet, bedpan, or urinal?: Total Help from another person bathing (including washing, rinsing, drying)?: A Lot Help from another person to put on and taking off regular upper body clothing?: A Lot Help from another person to put on and taking off regular lower body clothing?: A Lot 6 Click Score: 13  End of Session Nurse Communication: Other (comment)(pt on bed pan)  Activity Tolerance: Patient  tolerated treatment well Patient left: in bed;with call bell/phone within reach;with bed alarm set;Other (comment)(pt on bed pan)  OT Visit Diagnosis: Pain;Muscle weakness (generalized) (M62.81);Other symptoms and signs involving cognitive function Pain - Right/Left: Right Pain - part of body: Leg                Time: 5374-8270 OT Time Calculation (min): 16 min Charges:  OT General Charges $OT Visit: 1 Visit OT Evaluation $OT Re-eval: 1 Re-eval  Teresa OTR/L Acute Rehabilitation Services Office: [place] 05/27/2019, 11:37 AM

## 2019-05-27 NOTE — Plan of Care (Signed)
  Problem: Nutrition: Goal: Adequate nutrition will be maintained Outcome: Progressing   

## 2019-05-27 NOTE — Progress Notes (Signed)
   Subjective: No acute events overnight. Matthew Kline is less emotionally labile today. Leg pain is manageable. He seems aware that he is having word finding difficulties. Endorses some back pain when working with PT. No other concerns.   Objective:  Vital signs in last 24 hours: Vitals:   05/26/19 1617 05/26/19 2039 05/27/19 0441 05/27/19 0922  BP: (!) 141/71 124/75 117/67 117/66  Pulse: 84 84 71 73  Resp: 16 14 18 18   Temp: 98.7 F (37.1 C) 98.4 F (36.9 C) 98.1 F (36.7 C) 98.9 F (37.2 C)  TempSrc: Oral Oral Oral Oral  SpO2: 100% 98% 96% 100%  Weight:  54 kg    Height:       General: chronically ill appearing, NAD CV: RRR Pulm: normal work of breathing; lungs CTAB Abd: soft, non-tender, non-distended MSK: bilateral AKAs   Assessment/Plan:  Principal Problem:   Dehiscence of amputation stump (HCC) Active Problems:   PVD (peripheral vascular disease) (HCC)   ESRD on hemodialysis (HCC)   Cellulitis and abscess of leg   Severe protein-calorie malnutrition (HCC)   Cellulitis   Cellulitis of right leg   Abscess of right leg  Ptis a 59-yr-oldmanwith a PMH ofESRD, pancreatic insufficiency, HTN,T2DM, and bilateral LE amputationswho has purulent cellulitis not improving on antibiotics.Right AKA performed today. Pt has been persistently confused and emotionally labile during hospitalization.   P/s right AKA amputation Purulentcellulitis:  -POD 3; pain well controlled -continueIV vancomycin -IV hydromorphone0.5-1mg  q3h PRN -Oxycodone 5-10 mg q4h PRN -wound vac will remain in place for 1 week -continue PT/OT efforts; anticipate SNF  Encephalopathy: initially thought to have been related to infection, missing several HD sessions. However, patient has remained persistently disoriented despite source control with AKA and getting HD. Per son, patient is more oriented at baseline. No focal neurologic deficits. CT head yesterday without any acute abnormalities. May be  related to hospital delirium and recent anesthesia.  -delirium precautions -frequent redirection   ESRD  Acute on Chronic Anemia: -appreciate nephrology managing inpatient HD -hgb trended down from 10>7 in the setting of acute blood loss from surgery; received 1 unit while in HD yesterday; hgb stable at 7.9 today  -trend renal function panel, CBC   Pancreatic insufficiency: -Creon72,000 units TID  HTN: -Amlodipine5 mg QD -Carvedilol12.5 mg BID  T2DM: -SSI   Prior to Admission Living Arrangement: home Anticipated Discharge Location: SNF Barriers to Discharge: continued medical treatment Dispo: Anticipated discharge in approximately 1-2 day(s).   Modena Nunnery D, DO 05/27/2019, 12:02 PM Pager: [phone]

## 2019-05-27 NOTE — Progress Notes (Signed)
[facility] Progress Note   Subjective:  Seen in room - mood a little better today. He was very agitated during HD yesterday, yelled/crying out frequently. Head CT overnight without acute abnormality aside from sinus disease. No CP/dyspnea.   Objective Vitals:   05/26/19 1617 05/26/19 2039 05/27/19 0441 05/27/19 0922  BP: (!) 141/71 124/75 117/67 117/66  Pulse: 84 84 71 73  Resp: 16 14 18 18   Temp: 98.7 F (37.1 C) 98.4 F (36.9 C) 98.1 F (36.7 C) 98.9 F (37.2 C)  TempSrc: Oral Oral Oral Oral  SpO2: 100% 98% 96% 100%  Weight:  54 kg    Height:       Physical Exam General: Chronically ill appearing man, frail   Heart: RRR; no murmur Lungs: CTA anteriorly Abdomen: soft, non-tender Extremities: B AKA now; R bandaged Dialysis Access: L AVF + thrill  Additional Objective Labs: Basic Metabolic Panel: Recent Labs  Lab 05/25/19 0530 05/26/19 0810 05/27/19 0433  NA 136 137 140  K 3.5 3.4* 3.8  CL 98 100 105  CO2 26 25 28   GLUCOSE 91 124* 155*  BUN 21* 30* 18  CREATININE 4.10* 4.93* 2.94*  CALCIUM 8.7* 8.3* 8.6*  PHOS 3.6 4.2 2.3*   Liver Function Tests: Recent Labs  Lab 05/21/19 0739 05/22/19 0918 05/25/19 0530 05/26/19 0810 05/27/19 0433  AST 17  --   --   --   --   ALT 12  --   --   --   --   ALKPHOS 119  --   --   --   --   BILITOT 1.5*  --   --   --   --   PROT 7.5  --   --   --   --   ALBUMIN 2.5*   < > 2.0* 1.6* 1.7*   < > = values in this interval not displayed.   CBC: Recent Labs  Lab 05/21/19 0739 05/22/19 0301 05/23/19 0541 05/23/19 0541 05/24/19 0659 05/24/19 0659 05/25/19 0530 05/25/19 0530 05/26/19 0850 05/26/19 1901 05/27/19 0433  WBC 11.8*   < > 12.3*   < > 11.6*   < > 9.9  --  9.2  --  9.4  NEUTROABS 9.8*  --  10.7*  --   --   --   --   --   --   --   --   HGB 11.2*   < > 10.1*   < > 9.6*   < > 10.0*   < > 7.1* 8.4* 7.9*  HCT 35.7*   < > 31.5*   < > 30.4*   < > 31.6*   < > 21.9* 25.4* 24.2*  MCV 95.5   < > 92.9   --  93.3  --  93.8  --  92.0  --  93.1  PLT 149*   < > 117*   < > 105*   < > 109*  --  127*  --  128*   < > = values in this interval not displayed.   Studies/Results: CT HEAD WO CONTRAST  Result Date: 05/26/2019 CLINICAL DATA:  Encephalopathy. EXAM: CT HEAD WITHOUT CONTRAST TECHNIQUE: Contiguous axial images were obtained from the base of the skull through the vertex without intravenous contrast. COMPARISON:  11/24/2018 FINDINGS: Brain: There is no evidence of acute infarct, intracranial hemorrhage, mass, midline shift, or extra-axial fluid collection. A small chronic left parieto-occipital infarct is unchanged. Hypodensities in the cerebral white matter bilaterally are  similar to the prior study and nonspecific but compatible with mild chronic small vessel ischemic disease. Mild cerebral atrophy is within normal limits for age. Vascular: Calcified atherosclerosis at the skull base. No hyperdense vessel. Skull: No fracture suspicious osseous lesion. Sinuses/Orbits: Mucosal thickening and moderate volume fluid in the left maxillary sinus. Clear mastoid air cells. Unremarkable orbits. Other: None. IMPRESSION: 1. No evidence of acute intracranial abnormality. 2. Mild chronic small vessel ischemic disease with chronic left parieto-occipital infarct. 3. Left maxillary sinusitis. Electronically Signed   By: Logan Bores M.D.   On: 05/26/2019 19:11   Medications: . sodium chloride    . sodium chloride 50 mL/hr at 05/26/19 2300   . amLODipine  5 mg Oral QPM  . aspirin EC  81 mg Oral QHS  . carvedilol  12.5 mg Oral BID WC  . Chlorhexidine Gluconate Cloth  6 each Topical Q0600  . Chlorhexidine Gluconate Cloth  6 each Topical Daily  . darbepoetin (ARANESP) injection - DIALYSIS  60 mcg Intravenous Q Wed-HD  . docusate sodium  100 mg Oral BID  . feeding supplement (NEPRO CARB STEADY)  237 mL Oral TID BM  . feeding supplement (PRO-STAT SUGAR FREE 64)  30 mL Oral BID  . gabapentin  200 mg Oral Daily  .  heparin  5,000 Units Subcutaneous Q8H  . insulin aspart  0-5 Units Subcutaneous QHS  . insulin aspart  0-6 Units Subcutaneous TID WC  . lipase/protease/amylase  72,000 Units Oral TID with meals  . multivitamin  1 tablet Oral QHS  . mupirocin ointment  1 application Nasal BID  . nutrition supplement (JUVEN)  1 packet Oral BID BM  . sertraline  100 mg Oral Daily  . sevelamer carbonate  2,400 mg Oral TID WC    [facility], BFR 400, DFR Auto 1.5, EDW 63.5kg, 2K/2Ca Heparin 4000 unit bolus Venofer 100mg  IV q HD- received 5/10 doses Mircera 100 mcg q 2 weeks- last dose 4/23  Assessment/Plan: 1. R stump cellulitis:Wound Cx grew MSSA, BCx negative - on Vancomycin. Large abscess with necrosis of gastrocnemius muscle &tendinous attachment found during revision and debridement -> now s/p R AKA (5/14). 2.  AMS: Waxing/waning with depressed mood. Head CT 5/15 without acute abnormality. 3. ESRD:Back to usual MWF schedule now -> HD tomorrow 5/17. 4. Hypertension/volume:BP stable, below prior EDW. Will lower on discharge. 5. Anemia:Hgb 10 -> 7.1 post-op, 1U PRBC given. Getting Aranesp q Wed. 6. Metabolic bone disease:Calcium ok, Phos low side -> reduce Renvela to 2/meals. 7. Nutrition:Alb low. Continue Nepro + pro-stat supplements. 8. Diarrhea: Chronic issue, pancreatic insufficiency. On Creon.   Veneta Penton, PA-C 05/27/2019, 9:25 AM  [employer]

## 2019-05-28 LAB — GLUCOSE, CAPILLARY
Glucose-Capillary: 103 mg/dL — ABNORMAL HIGH (ref 70–99)
Glucose-Capillary: 126 mg/dL — ABNORMAL HIGH (ref 70–99)
Glucose-Capillary: 85 mg/dL (ref 70–99)
Glucose-Capillary: 106 mg/dL — ABNORMAL HIGH (ref 70–99)

## 2019-05-28 LAB — BASIC METABOLIC PANEL
Anion gap: 10 (ref 5–15)
BUN: 34 mg/dL — ABNORMAL HIGH (ref 6–20)
CO2: 25 mmol/L (ref 22–32)
Calcium: 8.5 mg/dL — ABNORMAL LOW (ref 8.9–10.3)
Chloride: 103 mmol/L (ref 98–111)
Creatinine, Ser: 4.03 mg/dL — ABNORMAL HIGH (ref 0.61–1.24)
GFR calc Af Amer: 18 mL/min — ABNORMAL LOW (ref >60)
GFR calc non Af Amer: 15 mL/min — ABNORMAL LOW (ref >60)
Glucose, Bld: 121 mg/dL — ABNORMAL HIGH (ref 70–99)
Potassium: 4.1 mmol/L (ref 3.5–5.1)
Sodium: 138 mmol/L (ref 135–145)

## 2019-05-28 LAB — CBC
HCT: 23.6 % — ABNORMAL LOW (ref 39.0–52.0)
Hemoglobin: 7.4 g/dL — ABNORMAL LOW (ref 13.0–17.0)
MCH: 30 pg (ref 26.0–34.0)
MCHC: 31.4 g/dL (ref 30.0–36.0)
MCV: 95.5 fL (ref 80.0–100.0)
Platelets: 133 10*3/uL — ABNORMAL LOW (ref 150–400)
RBC: 2.47 MIL/uL — ABNORMAL LOW (ref 4.22–5.81)
RDW: 16.6 % — ABNORMAL HIGH (ref 11.5–15.5)
WBC: 8.8 10*3/uL (ref 4.0–10.5)
nRBC: 0 % (ref 0.0–0.2)

## 2019-05-28 NOTE — Progress Notes (Signed)
[facility] Progress Note   Subjective:   Patient seen in room. Reports ongoing LE pain. Had episode of confusion overnight, now alert and calm but appears to lose his train of though easily. Denies SOB, CP, palpitations, abdominal pain, N/V/D.   Objective Vitals:   05/27/19 1619 05/27/19 2103 05/28/19 0600 05/28/19 0850  BP: 116/68 124/77 130/84 132/80  Pulse: 79 78 74 76  Resp: 20 18 16 18   Temp: 98.5 F (36.9 C) 98.6 F (37 C) 98.3 F (36.8 C) 98 F (36.7 C)  TempSrc: Oral Oral Oral Oral  SpO2: 99% 97% 97% 98%  Weight:  54.8 kg    Height:       Physical Exam General: Chronically ill appearing male, alert and in NAD Heart: RRR, no murmurs, rubs or gallops Lungs: CTA bilaterally without wheezing, rhonchi or rales Abdomen: Soft, non-tender, non-distended, +BS Extremities: Bilateral AKA, Right stump with wound vac, left stump TTP, no LE edema Dialysis Access: LUE AVF + thrill  Additional Objective Labs: Basic Metabolic Panel: Recent Labs  Lab 05/25/19 0530 05/25/19 0530 05/26/19 0810 05/27/19 0433 05/28/19 0701  NA 136   < > 137 140 138  K 3.5   < > 3.4* 3.8 4.1  CL 98   < > 100 105 103  CO2 26   < > 25 28 25   GLUCOSE 91   < > 124* 155* 121*  BUN 21*   < > 30* 18 34*  CREATININE 4.10*   < > 4.93* 2.94* 4.03*  CALCIUM 8.7*   < > 8.3* 8.6* 8.5*  PHOS 3.6  --  4.2 2.3*  --    < > = values in this interval not displayed.   Liver Function Tests: Recent Labs  Lab 05/25/19 0530 05/26/19 0810 05/27/19 0433  ALBUMIN 2.0* 1.6* 1.7*   CBC: Recent Labs  Lab 05/23/19 0541 05/23/19 0541 05/24/19 0659 05/24/19 0659 05/25/19 0530 05/25/19 0530 05/26/19 0850 05/26/19 0850 05/26/19 1901 05/27/19 0433 05/28/19 0701  WBC 12.3*   < > 11.6*   < > 9.9   < > 9.2  --   --  9.4 8.8  NEUTROABS 10.7*  --   --   --   --   --   --   --   --   --   --   HGB 10.1*   < > 9.6*   < > 10.0*   < > 7.1*   < > 8.4* 7.9* 7.4*  HCT 31.5*   < > 30.4*   < > 31.6*   < >  21.9*   < > 25.4* 24.2* 23.6*  MCV 92.9   < > 93.3  --  93.8  --  92.0  --   --  93.1 95.5  PLT 117*   < > 105*   < > 109*   < > 127*  --   --  128* 133*   < > = values in this interval not displayed.   Blood Culture    Component Value Date/Time   SDES BLOOD RIGHT ARM 05/21/2019 0851   SPECREQUEST  05/21/2019 0851    BOTTLES DRAWN AEROBIC AND ANAEROBIC Blood Culture results may not be optimal due to an excessive volume of blood received in culture bottles   CULT  05/21/2019 0851    NO GROWTH 5 DAYS Performed at [facility], [place] [address] [place], [place] [address]    REPTSTATUS 05/26/2019 FINAL 05/21/2019 [phone]  CBG: Recent Labs  Lab 05/27/19 1125 05/27/19 1618 05/27/19 2014 05/28/19 0725 05/28/19 1137  GLUCAP 128* 164* 158* 103* 126*    Studies/Results: CT HEAD WO CONTRAST  Result Date: 05/26/2019 CLINICAL DATA:  Encephalopathy. EXAM: CT HEAD WITHOUT CONTRAST TECHNIQUE: Contiguous axial images were obtained from the base of the skull through the vertex without intravenous contrast. COMPARISON:  11/24/2018 FINDINGS: Brain: There is no evidence of acute infarct, intracranial hemorrhage, mass, midline shift, or extra-axial fluid collection. A small chronic left parieto-occipital infarct is unchanged. Hypodensities in the cerebral white matter bilaterally are similar to the prior study and nonspecific but compatible with mild chronic small vessel ischemic disease. Mild cerebral atrophy is within normal limits for age. Vascular: Calcified atherosclerosis at the skull base. No hyperdense vessel. Skull: No fracture suspicious osseous lesion. Sinuses/Orbits: Mucosal thickening and moderate volume fluid in the left maxillary sinus. Clear mastoid air cells. Unremarkable orbits. Other: None. IMPRESSION: 1. No evidence of acute intracranial abnormality. 2. Mild chronic small vessel ischemic disease with chronic left parieto-occipital infarct. 3. Left maxillary sinusitis. Electronically  Signed   By: Logan Bores M.D.   On: 05/26/2019 19:11   Medications: . sodium chloride    . sodium chloride 50 mL/hr at 05/27/19 1555   . amLODipine  5 mg Oral QPM  . aspirin EC  81 mg Oral QHS  . carvedilol  12.5 mg Oral BID WC  . Chlorhexidine Gluconate Cloth  6 each Topical Q0600  . darbepoetin (ARANESP) injection - DIALYSIS  60 mcg Intravenous Q Wed-HD  . docusate sodium  100 mg Oral BID  . feeding supplement (NEPRO CARB STEADY)  237 mL Oral TID BM  . feeding supplement (PRO-STAT SUGAR FREE 64)  30 mL Oral BID  . gabapentin  200 mg Oral Daily  . heparin  5,000 Units Subcutaneous Q8H  . insulin aspart  0-5 Units Subcutaneous QHS  . insulin aspart  0-6 Units Subcutaneous TID WC  . lipase/protease/amylase  72,000 Units Oral TID with meals  . multivitamin  1 tablet Oral QHS  . mupirocin ointment  1 application Nasal BID  . nutrition supplement (JUVEN)  1 packet Oral BID BM  . sertraline  100 mg Oral Daily  . sevelamer carbonate  2,400 mg Oral TID WC    [facility] 180Nre, BFR 400, DFR Auto 1.5, EDW 63.5kg, 2K/2Ca Heparin 4000 unit bolus Venofer 100mg  IV q HD- received 5/10 doses Mircera 100 mcg q 2 weeks- last dose 4/23  Assessment/Plan: 1. R stump cellulitis:Wound Cx grewMSSA, BCx negative -initially on Vancomycin. 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 2. AMS: Waxing/waning with depressed mood. Head CT 5/15 without acute abnormality. 3. ESRD:K+ 4.1, BUN 34. Not overly uremic- do not think this is contributing to his AMS. Back to usual MWF schedule now -next HD today. 4. Hypertension/volume:BPstable, below prior EDW. Will lower on discharge. 5. Anemia:Hgb 10 -> 7.1 post-op, 1U PRBC given, now 7.4. Getting Aranesp q Wed. 6. Metabolic bone disease: Corrected calcium 10.4, not on VDRA, use low Ca bath. Phos 2.3, renvela  reduced to 2 per meal.  7. Nutrition:Alb low. Continue Nepro + pro-stat supplements. 8. Diarrhea: Chronic issue, pancreatic insufficiency. On Creon.  Anice Paganini, PA-C 05/28/2019, 11:54 AM  [facility] Pager: [phone]

## 2019-05-28 NOTE — Progress Notes (Signed)
Patient is status post above-knee amputation.  He is somewhat emotional this morning.  Nurses no earlier appreciated he is afraid to be alone.  Vital signs stable no drainage in canister.  I reassured the patient that there are nurses following him.  He is going to speak with the chaplain today I think this would be very helpful for him.  From an orthopedic standpoint could transfer to SNF when bed available and medically stable

## 2019-05-28 NOTE — Progress Notes (Signed)
The chaplain responded to a request for prayer. The chaplain visited with the patient, but the visit was cut short by an important phone call. The chaplain will follow-up later.  Brion Aliment Chaplain Resident For questions concerning this note please contact me by pager [phone]

## 2019-05-28 NOTE — NC FL2 (Signed)
[place] LEVEL OF CARE SCREENING TOOL     IDENTIFICATION  Patient Name: Matthew Kline Birthdate: 1960/08/26 Sex: male Admission Date (Current Location): 05/21/2019  [facility] and [place] Number:  Herbalist and Address:  [facility] [place], [place] [address] [place], [place] [address]      Provider Number: [id number]  Attending Physician Name and Address:  Velna Ochs, MD  Relative Name and Phone Number:  Snyder Colavito; [phone]    Current Level of Care: Hospital Recommended Level of Care: [place] Prior Approval Number:    Date Approved/Denied:   PASRR Number: [id number] A - eff. 01/17/12  Discharge Plan: SNF    Current Diagnoses: Patient Active Problem List   Diagnosis Date Noted  . Cellulitis of right leg   . Dehiscence of amputation stump ([place])   . Abscess of right leg   . Cellulitis 05/21/2019  . Below-knee amputation of right lower extremity ([place])   . Abscess of tendon sheath, right ankle and foot   . Diabetic polyneuropathy associated with type 2 diabetes mellitus ([place])   . Severe protein-calorie malnutrition ([place])   . Cellulitis and abscess of leg 12/29/2018  . Cellulitis of scrotum 11/27/2018  . Sepsis ([place]) 11/24/2018  . Thrombocytopenia ([place]) 11/24/2018  . Bilateral inguinal hernia 11/21/2018  . Biliary colic 31/49/7026  . Acute cholecystitis 07/31/2018  . Prolonged QT interval 07/31/2018  . Acute respiratory failure with hypoxia ([place]) 07/10/2018  . Abdominal pain 07/10/2018  . Bilateral recurrent inguinal hernia without obstruction or gangrene   . Altered mental status   . Cerebral thrombosis with cerebral infarction 02/06/2017  . Cerebral embolism with cerebral infarction 02/06/2017  . Hx of AKA (above knee amputation), left ([place])   . Pressure injury of skin 01/30/2017  . Acute lower UTI 01/29/2017  . Acute metabolic encephalopathy 37/85/8850  . UTI (urinary tract infection)  01/29/2017  . Quadriceps muscle rupture, left, initial encounter   . Fall 01/09/2017  . Gait disturbance 01/09/2017  . Staphylococcus aureus infection 01/09/2017  . Infection of prosthetic left knee joint ([place]) 01/09/2017  . Fever, unknown origin 11/09/2016  . Critical lower limb ischemia 08/25/2016  . Ulcer of left midfoot with fat layer exposed ([place]) 08/13/2016  . Diabetic ulcer of left midfoot associated with type 2 diabetes mellitus, with fat layer exposed ([place]) 07/25/2016  . Peripheral neuropathy 07/22/2016  . Tobacco abuse 07/22/2016  . CAD in native artery 05/18/2016  . CAD, multiple vessel 05/11/2016  . Positive cardiac stress test 05/11/2016  . Abnormal stress test 04/30/2016  . Pre-transplant evaluation for kidney transplant 04/30/2016  . S/P revision of total knee 11/26/2015  . Pain in the chest   . Acute on chronic diastolic heart failure ([place]) 07/05/2015  . Volume overload 07/04/2015  . Shortness of breath 07/04/2015  . Hypoxemia 07/04/2015  . Elevated troponin   . ESRD on hemodialysis ([place])   . Hypervolemia   . 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 Pyogenic bacterial arthritis of knee, left ([place]) 08/07/2014  . Tachycardia 07/24/2014  . Acute upper respiratory infection 07/24/2014  . ESRD on dialysis ([place]) 07/14/2014  . Type II diabetes mellitus ([place]) 07/14/2014  . Anemia in chronic kidney disease 07/14/2014  . Congestive heart failure (CHF) ([place]) 07/13/2014  . Surgical wound dehiscence 05/09/2014  . Dehiscence of closure of skin 05/09/2014  . Total knee replacement status 04/10/2014  . Diabetes mellitus with renal manifestations, controlled ([place]) 10/24/2013  . Hypertensive  renal disease 06/27/2013  . DM type 2 causing vascular disease ([place]) 06/27/2013  . Erectile dysfunction 06/27/2013  . Depression 06/27/2013  . PVD (peripheral vascular disease) ([place]) 12/19/2012  . Essential hypertension, benign 12/19/2012  . Sinusitis, acute maxillary  11/22/2012  . Otitis, externa, infective 11/14/2012  . Leg edema, left 11/14/2012  . End stage renal disease ([place]) 10/02/2012  . Controlled type 2 DM with proteinuria or microalbuminuria 09/19/2012  . GERD (gastroesophageal reflux disease) 09/19/2012  . Leukocytosis 09/19/2012  . Lacunar infarction ([place]) 08/17/2012  . Polymyalgia rheumatica ([place]) 08/17/2012  . 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 Essential hypertension 05/10/2012  . Vitamin D deficiency 05/10/2012  . Diabetes mellitus due to underlying condition ([place]) 05/10/2012  . Hyperlipidemia LDL goal <100 05/10/2012  . Anemia of chronic disease 05/10/2012  . Screening for prostate cancer 05/10/2012  . Chronic kidney disease (CKD), stage IV (severe) ([place]) 05/10/2012  . Peripheral autonomic neuropathy due to DM ([place]) 05/10/2012  . Callus of foot 05/10/2012  . Urgency of urination 05/10/2012  . Hyperkalemia 05/10/2012  . Candidiasis of the esophagus 10/12/2011  . Internal hemorrhoids without mention of complication 40/98/1191  . Pre-syncope 07/25/2009  . DJD (degenerative joint disease) of cervical spine 02/17/2009    Orientation RESPIRATION BLADDER Height & Weight     Self, Place  Normal Continent Weight: 125 lb 10.6 oz (57 kg) Height:  5' 10.98" (180.3 cm)  BEHAVIORAL SYMPTOMS/MOOD NEUROLOGICAL BOWEL NUTRITION STATUS      Incontinent Diet(Carb modified)  AMBULATORY STATUS COMMUNICATION OF NEEDS Skin   Total Care(Patient double amputee-unable to ambulate) Verbally Other (Comment)(Open wound right stump with negative pressure wound therapy at surgical site; closed incision right let; MAsD sacrum, scrotum and groin, treated with barrier cream)                       Personal Care Assistance Level of Assistance  Bathing, Feeding, Dressing Bathing Assistance: Limited assistance(Min assist) Feeding assistance: Limited assistance(Assistance wtih set-up) Dressing Assistance: Maximum assistance(Min assist upper body; Mod  assist lower bodyu)     Functional Limitations Info  Sight, Hearing, Speech Sight Info: Adequate Hearing Info: Adequate Speech Info: Adequate    SPECIAL CARE FACTORS FREQUENCY  PT (By licensed PT), OT (By licensed OT)     PT Frequency: Evaluated 5/12. PT at [facility] Eval and Treat a minimium of 5 days pr week OT Frequency: Evaluated 5/12 and re-evaluated 5/16. OT at SNF Eval and Treat a minimum of 5 days per week            Contractures Contractures Info: Not present    Additional Factors Info  Code Status, Allergies, Insulin Sliding Scale Code Status Info: Full Code Allergies Info: Morphine and related, Tygacil - tigecycline; Imodium   Insulin Sliding Scale Info: 0-5 Units daily at bedtime; 0-6 Units 3 times per day with meals       Current Medications (05/28/2019):  This is the current hospital active medication list Current Facility-Administered Medications  Medication Dose Route Frequency Provider Last Rate Last Admin  . 0.9 %  sodium chloride infusion   Intravenous Continuous Persons, Bevely Palmer, PA      . 0.9 %  sodium chloride infusion   Intravenous Continuous Persons, Bevely Palmer, [place] 50 mL/hr at 05/27/19 1555 New Bag at 05/27/19 1555  . acetaminophen (TYLENOL) tablet 650 mg  650 mg Oral Q6H PRN Persons, Bevely Palmer, PA       Or  . acetaminophen (TYLENOL) suppository  650 mg  650 mg Rectal Q6H PRN Persons, Bevely Palmer, PA      . amLODipine (NORVASC) tablet 5 mg  5 mg Oral QPM Persons, Bevely Palmer, [place]   5 mg at 05/27/19 1738  . aspirin EC tablet 81 mg  81 mg Oral QHS Persons, Bevely Palmer, [place]   81 mg at 05/27/19 2248  . carvedilol (COREG) tablet 12.5 mg  12.5 mg Oral BID WC Persons, Bevely Palmer, PA   12.5 mg at 05/27/19 1738  . Chlorhexidine Gluconate Cloth 2 % PADS 6 each  6 each Topical Q0600 Loren Racer, PA-C   6 each at 05/27/19 1059  . Darbepoetin Alfa (ARANESP) injection 60 mcg  60 mcg Intravenous Q Wed-HD Persons, Bevely Palmer, PA   60 mcg at 05/24/19 0443  . docusate sodium  (COLACE) capsule 100 mg  100 mg Oral BID Persons, Bevely Palmer, PA   100 mg at 05/25/19 2100  . feeding supplement (NEPRO CARB STEADY) liquid 237 mL  237 mL Oral TID BM Velna Ochs, MD   237 mL at 05/28/19 0949  . feeding supplement (PRO-STAT SUGAR FREE 64) liquid 30 mL  30 mL Oral BID Persons, Bevely Palmer, PA   30 mL at 05/27/19 2248  . gabapentin (NEURONTIN) capsule 200 mg  200 mg Oral Daily Persons, Bevely Palmer, PA   200 mg at 05/27/19 0846  . heparin injection 5,000 Units  5,000 Units Subcutaneous Q8H Persons, Bevely Palmer, PA   5,000 Units at 05/27/19 2248  . HYDROmorphone (DILAUDID) injection 0.5-1 mg  0.5-1 mg Intravenous Q3H PRN Persons, Bevely Palmer, PA   1 mg at 05/28/19 8127  . hydrOXYzine (ATARAX/VISTARIL) tablet 50 mg  50 mg Oral TID PRN Persons, Bevely Palmer, PA   50 mg at 05/21/19 1422  . insulin aspart (novoLOG) injection 0-5 Units  0-5 Units Subcutaneous QHS Persons, Bevely Palmer, PA      . insulin aspart (novoLOG) injection 0-6 Units  0-6 Units Subcutaneous TID WC Persons, Bevely Palmer, PA      . lipase/protease/amylase (CREON) capsule 36,000 Units  36,000 Units Oral PRN Persons, Bevely Palmer, PA      . lipase/protease/amylase (CREON) capsule 72,000 Units  72,000 Units Oral TID with meals Persons, Bevely Palmer, [place]   72,000 Units at 05/28/19 0944  . liver oil-zinc oxide (DESITIN) 40 % ointment 1 application  1 application Topical BID PRN Persons, Bevely Palmer, [place]   1 application at 51/70/01 0030  . loperamide (IMODIUM) capsule 2 mg  2 mg Oral PRN Persons, Bevely Palmer, PA   2 mg at 05/28/19 1049  . montelukast (SINGULAIR) tablet 10 mg  10 mg Oral QHS PRN Persons, Bevely Palmer, PA      . multivitamin (RENA-VIT) tablet 1 tablet  1 tablet Oral QHS Persons, Bevely Palmer, [place]   1 tablet at 05/27/19 2247  . mupirocin ointment (BACTROBAN) 2 % 1 application  1 application Nasal BID Velna Ochs, MD   1 application at 74/94/49 0954  . nutrition supplement (JUVEN) (JUVEN) powder packet 1 packet  1 packet Oral BID BM  Velna Ochs, MD   1 packet at 05/27/19 0845  . oxyCODONE (Oxy IR/ROXICODONE) immediate release tablet 5-10 mg  5-10 mg Oral Q4H PRN Persons, Bevely Palmer, PA   10 mg at 05/28/19 1049  . polyethylene glycol (MIRALAX / GLYCOLAX) packet 17 g  17 g Oral Daily PRN Persons, Bevely Palmer, PA      . promethazine (PHENERGAN) tablet 12.5 mg  12.5 mg Oral Q6H PRN Persons, Bevely Palmer, [place]      . sertraline (ZOLOFT) tablet 100 mg  100 mg Oral Daily Persons, Bevely Palmer, PA   100 mg at 05/27/19 0846  . sevelamer carbonate (RENVELA) tablet 2,400 mg  2,400 mg Oral TID WC Persons, Bevely Palmer, PA   2,400 mg at 05/28/19 1583     Discharge Medications: Please see discharge summary for a list of discharge medications.  Relevant Imaging Results:  Relevant Lab Results:   Additional Information ss#[id number]. Dialysis patient MWF [place] ([place])  [place], Mila [place], [place]

## 2019-05-28 NOTE — TOC Initial Note (Signed)
Transition of Care [facility] - Initial/Assessment Note    Patient Details  Name: Matthew Kline MRN: [id number] Date of Birth: 12/20/1960  Transition of Care [facility]) CM/SW Contact:    Matthew Feil, LCSW Phone Number: 05/28/2019, 3:05 PM  Clinical Narrative: Talked with patient at the bedside. Matthew Kline was in bed, awake, alert and confused. When asked about who lives with him. Patient responded that his daughter Matthew Kline has  [place] living with him for 3/4 years. CSW advised that she does not work and she "watches" him. . Matthew Kline gave permission for his daughter Matthew Kline and/or son Matthew Kline to be contacted. CSW explained the recommendation of ST rehab and Matthew Kline willing to consider this.   Although alert and able to talk with CSW, Matthew Kline was displayed confusion during the conversation. Early in the conversation, Matthew Kline asked if this CSW was hired for job at [employer]. Further in the conversation, patient looked up and said, "It's like water", and added that he had been dealing with it for last week and asked CSW, "Can you feel it?"      Son Matthew Kline contacted [phone]) and provided with information regarding recommendation of [place] rehab. Son expressed concern regarding the facility patient would discharge to and added that when patient was at [facility] he fell and laid on the floor an hour and this concerned him. Son agreeable to ST rehab and the facility search process explained and son provided with web site information for [email or web address]. When asked, Matthew Kline responded that his sister does live with his dad.                     .                  Expected Discharge Plan: Skilled Nursing Facility Barriers to Discharge: Continued Medical Work up   Patient Goals and CMS Choice Patient states their goals for this hospitalization and ongoing recovery are:: Patient agreeable to ST rehab before returning home CMS Medicare.gov Compare Post Acute Care list provided to:: Other (Comment Required)(Did  not talk with patient regardng Medicare.gov as he was confused) Choice offered to / list presented to : Adult Children(Talked with son Matthew Kline)  Expected Discharge Plan and Services Expected Discharge Plan: [place] In-house Referral: Clinical Social Work   Post Acute Care Choice: [place] Living arrangements for the past 2 months: [place]                                      Prior Living Arrangements/Services Living arrangements for the past 2 months: Single Family Home Lives with:: Adult Children(Per patient, his daughter Matthew Kline lives with him) Patient language and need for interpreter reviewed:: No Do you feel safe going back to the place where you live?: No   Patient and family agreeable to [place] rehab  Need for Family Participation in Patient Care: Yes (Comment) Care giver support system in place?: Yes (comment)   Criminal Activity/Legal Involvement Pertinent to Current Situation/Hospitalization: No - Comment as needed  Activities of Daily Living Home Assistive Devices/Equipment: Wheelchair ADL Screening (condition at time of admission) Patient's cognitive ability adequate to safely complete daily activities?: No Is the patient deaf or have difficulty hearing?: No Does the patient have difficulty seeing, even when wearing glasses/contacts?: No Does the patient have difficulty concentrating, remembering, or making decisions?: Yes Patient able to express  need for assistance with ADLs?: Yes Does the patient have difficulty dressing or bathing?: No Independently performs ADLs?: Yes (appropriate for developmental age) Does the patient have difficulty walking or climbing stairs?: Yes Weakness of Legs: Both(BILATERAL AMPUTEE) Weakness of Arms/Hands: None  Permission Sought/Granted Permission sought to share information with : Family Supports    Share Information with NAME: Matthew, Kline     Permission granted to share info w  Relationship: Daughter. son  Permission granted to share info w Contact Information: [place]. [phone]; Son [phone]  Emotional Assessment Appearance:: Appears older than stated age Attitude/Demeanor/Rapport: Other (comment)(Appropriate) Affect (typically observed): Pleasant Orientation: : Oriented to Self, Oriented to Place Alcohol / Substance Use: Tobacco Use, Alcohol Use, Illicit Drugs(Infor. not on file) Psych Involvement: No (comment)  Admission diagnosis:  Cellulitis [L03.90] Cellulitis of right leg [L03.115] ESRD on hemodialysis ([place]) [N18.6, Z99.2] Patient Active Problem List   Diagnosis Date Noted  . Cellulitis of right leg   . Dehiscence of amputation stump ([place])   . Abscess of right leg   . Cellulitis 05/21/2019  . Below-knee amputation of right lower extremity ([place])   . Abscess of tendon sheath, right ankle and foot   . Diabetic polyneuropathy associated with type 2 diabetes mellitus ([place])   . Severe protein-calorie malnutrition ([place])   . Cellulitis and abscess of leg 12/29/2018  . Cellulitis of scrotum 11/27/2018  . Sepsis ([place]) 11/24/2018  . Thrombocytopenia ([place]) 11/24/2018  . Bilateral inguinal hernia 11/21/2018  . Biliary colic 41/28/7867  . Acute cholecystitis 07/31/2018  . Prolonged QT interval 07/31/2018  . Acute respiratory failure with hypoxia ([place]) 07/10/2018  . Abdominal pain 07/10/2018  . Bilateral recurrent inguinal hernia without obstruction or gangrene   . Altered mental status   . Cerebral thrombosis with cerebral infarction 02/06/2017  . Cerebral embolism with cerebral infarction 02/06/2017  . Hx of AKA (above knee amputation), left ([place])   . Pressure injury of skin 01/30/2017  . Acute lower UTI 01/29/2017  . Acute metabolic encephalopathy 67/20/9470  . UTI (urinary tract infection) 01/29/2017  . Quadriceps muscle rupture, left, initial encounter   . Fall 01/09/2017  . Gait disturbance 01/09/2017  . Staphylococcus aureus infection  01/09/2017  . Infection of prosthetic left knee joint ([place]) 01/09/2017  . Fever, unknown origin 11/09/2016  . Critical lower limb ischemia 08/25/2016  . Ulcer of left midfoot with fat layer exposed ([place]) 08/13/2016  . Diabetic ulcer of left midfoot associated with type 2 diabetes mellitus, with fat layer exposed ([place]) 07/25/2016  . Peripheral neuropathy 07/22/2016  . Tobacco abuse 07/22/2016  . CAD in native artery 05/18/2016  . CAD, multiple vessel 05/11/2016  . Positive cardiac stress test 05/11/2016  . Abnormal stress test 04/30/2016  . Pre-transplant evaluation for kidney transplant 04/30/2016  . S/P revision of total knee 11/26/2015  . Pain in the chest   . Acute on chronic diastolic heart failure ([place]) 07/05/2015  . Volume overload 07/04/2015  . Shortness of breath 07/04/2015  . Hypoxemia 07/04/2015  . Elevated troponin   . ESRD on hemodialysis ([place])   . Hypervolemia   . 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 Pyogenic bacterial arthritis of knee, left ([place]) 08/07/2014  . Tachycardia 07/24/2014  . Acute upper respiratory infection 07/24/2014  . ESRD on dialysis ([place]) 07/14/2014  . Type II diabetes mellitus ([place]) 07/14/2014  . Anemia in chronic kidney disease 07/14/2014  . Congestive heart failure (CHF) ([place]) 07/13/2014  . Surgical wound dehiscence  05/09/2014  . Dehiscence of closure of skin 05/09/2014  . Total knee replacement status 04/10/2014  . Diabetes mellitus with renal manifestations, controlled ([place]) 10/24/2013  . Hypertensive renal disease 06/27/2013  . DM type 2 causing vascular disease ([place]) 06/27/2013  . Erectile dysfunction 06/27/2013  . Depression 06/27/2013  . PVD (peripheral vascular disease) ([place]) 12/19/2012  . Essential hypertension, benign 12/19/2012  . Sinusitis, acute maxillary 11/22/2012  . Otitis, externa, infective 11/14/2012  . Leg edema, left 11/14/2012  . End stage renal disease ([place]) 10/02/2012  . Controlled type 2 DM with  proteinuria or microalbuminuria 09/19/2012  . GERD (gastroesophageal reflux disease) 09/19/2012  . Leukocytosis 09/19/2012  . Lacunar infarction ([place]) 08/17/2012  . Polymyalgia rheumatica ([place]) 08/17/2012  . 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 Essential hypertension 05/10/2012  . Vitamin D deficiency 05/10/2012  . Diabetes mellitus due to underlying condition ([place]) 05/10/2012  . Hyperlipidemia LDL goal <100 05/10/2012  . Anemia of chronic disease 05/10/2012  . Screening for prostate cancer 05/10/2012  . Chronic kidney disease (CKD), stage IV (severe) ([place]) 05/10/2012  . Peripheral autonomic neuropathy due to DM ([place]) 05/10/2012  . Callus of foot 05/10/2012  . Urgency of urination 05/10/2012  . Hyperkalemia 05/10/2012  . Candidiasis of the esophagus 10/12/2011  . Internal hemorrhoids without mention of complication 25/00/3704  . Pre-syncope 07/25/2009  . DJD (degenerative joint disease) of cervical spine 02/17/2009   PCP:  [place]:   [id number] [address] [place], [place] [address] Phone: [phone] Fax: [phone]  [id number] [place], [place] [address] [place] [address] Phone: [phone] Fax: [phone]    Social Determinants of Health (SDOH) Interventions  No SDOH interventions requested or needed at this time.  Readmission Risk Interventions No flowsheet data found.

## 2019-05-28 NOTE — Progress Notes (Signed)
HD#7 Subjective:  Overnight Events: Patient still confused overnight.  Matthew Kline is alert to self and city, but was able to tell me that he was in [facility].  Otherwise he states that he is having some abdominal pain and seems to have a difficult time articulating how he feels.  Objective:  Vital signs in last 24 hours: Vitals:   05/27/19 0441 05/27/19 0922 05/27/19 1619 05/27/19 2103  BP: 117/67 117/66 116/68 124/77  Pulse: 71 73 79 78  Resp: 18 18 20 18   Temp: 98.1 F (36.7 C) 98.9 F (37.2 C) 98.5 F (36.9 C) 98.6 F (37 C)  TempSrc: Oral Oral Oral Oral  SpO2: 96% 100% 99% 97%  Weight:    54.8 kg  Height:       Supplemental O2: Nasal cannula SpO2: 97 % O2 Flow Rate (L/min): 2 L/min   Physical Exam:  Physical Exam  Constitutional: No distress.  HENT:  Head: Normocephalic and atraumatic.  Eyes: EOM are normal.  Cardiovascular: Normal rate and intact distal pulses.  Pulmonary/Chest: Effort normal. No respiratory distress. He has no wheezes.  Abdominal: Soft. He exhibits no distension. There is abdominal tenderness.  Musculoskeletal:        General: Deformity (Bilateral AKA's with wound VAC in place) present. No edema.     Cervical back: Normal range of motion.  Neurological: He is alert.  Skin: Skin is warm and dry. He is not diaphoretic.    Filed Weights   05/26/19 1200 05/26/19 2039 05/27/19 2103  Weight: 53.8 kg 54 kg 54.8 kg     Intake/Output Summary (Last 24 hours) at 05/28/2019 0554 Last data filed at 05/28/2019 0201 Gross per 24 hour  Intake 500 ml  Output 0 ml  Net 500 ml    Pertinent Labs: CBC Latest Ref Rng & Units 05/27/2019 05/26/2019 05/26/2019  WBC 4.0 - 10.5 K/uL 9.4 - 9.2  Hemoglobin 13.0 - 17.0 g/dL 7.9(L) 8.4(L) 7.1(L)  Hematocrit 39.0 - 52.0 % 24.2(L) 25.4(L) 21.9(L)  Platelets 150 - 400 K/uL 128(L) - 127(L)    CMP Latest Ref Rng & Units 05/27/2019 05/26/2019 05/25/2019  Glucose 70 - 99 mg/dL 155(H) 124(H) 91  BUN  6 - 20 mg/dL 18 30(H) 21(H)  Creatinine 0.61 - 1.24 mg/dL 2.94(H) 4.93(H) 4.10(H)  Sodium 135 - 145 mmol/L 140 137 136  Potassium 3.5 - 5.1 mmol/L 3.8 3.4(L) 3.5  Chloride 98 - 111 mmol/L 105 100 98  CO2 22 - 32 mmol/L 28 25 26   Calcium 8.9 - 10.3 mg/dL 8.6(L) 8.3(L) 8.7(L)  Total Protein 6.5 - 8.1 g/dL - - -  Total Bilirubin 0.3 - 1.2 mg/dL - - -  Alkaline Phos 38 - 126 U/L - - -  AST 15 - 41 U/L - - -  ALT 0 - 44 U/L - - -    Pending Labs: None  Imaging: No results found.    Assessment/Plan:   Principal Problem:   Dehiscence of amputation stump (HCC) Active Problems:   PVD (peripheral vascular disease) ([place])   ESRD on hemodialysis ([place])   Cellulitis and abscess of leg   Severe protein-calorie malnutrition (HCC)   Cellulitis   Cellulitis of right leg   Abscess of right leg    Patient Summary: Ptis a 59-yr-oldmanwith a PMH ofESRD, pancreatic insufficiency, HTN,T2DM, and bilateral LE amputationswho has purulent cellulitis not improving on antibiotics.Right AKA performed today. Pt has been persistently confused and emotionally labile during hospitalization.   P/s right  AKA amputation Purulentcellulitis: -POD 3; pain well controlled - Day 6 of vancomycin, continueIV vancomycin -IV hydromorphone0.5-1mg  q3h PRN, required 6 mg total yesterday.  -Oxycodone 5-10 mg q4h PRN, has not required any in the past 2-3 days -wound vac will remain in place for 1 week -continue PT/OT efforts; anticipate SNF  Encephalopathy:  Thought to be secondary to missing hemodialysis sessions versus recent infection.  Patient's altered mental status is only minimally improved since getting source control and hemodialysis.  Likely secondary to delirium.  No obvious reversible causes found.   -delirium precautions -frequent redirection  ESRD Acute onChronic Anemia: -appreciate nephrology managing inpatient HD -trend renal function panel, CBC  Pancreatic  insufficiency: -Creon72,000 units TID  HTN: -Amlodipine5 mg QD -Carvedilol12.5 mg BID  T2DM: -SSI  Diet: Carb-Modified IVF: NS,50cc/hr VTE: Heparin Code: Full PT/OT recs: Pending TOC recs: SNF placement pending   Dispo: Anticipated discharge pending SNF placement.    Marianna Payment, D.O. MCIMTP, PGY-1 Date 05/28/2019 Time 5:54 AM

## 2019-05-28 NOTE — Progress Notes (Signed)
Pt is very upset and crying this am. Pt is confused and thinks he is locked in a club and had called 911. RN informed pt that he is in the hospital and that he is safe. Pt stated that he wanted to know who he wronged in life because he felt like he was being punished by having to have his legs amputated. RN and charge RN tried to reassure pt that he was not being punished. RN asked pt if he would like to speak with a chaplain and he stated "yes". RN paged chaplain to make aware and arrange for pt to be seen. Pt is very grateful for this and kept saying thank you and that he was appreciative of all that we have done. Pt is very emotional and states that he feels so alone. Pt is resting comfortably in the bed at this time eating an icy. RN will continue to monitor.   Eleanora Neighbor, RN

## 2019-05-28 NOTE — Plan of Care (Signed)
  Problem: Education: Goal: Knowledge of General Education information will improve Description: Including pain rating scale, medication(s)/side effects and non-pharmacologic comfort measures Outcome: Progressing   Problem: Pain Managment: Goal: General experience of comfort will improve Outcome: Progressing   

## 2019-05-29 LAB — COMPREHENSIVE METABOLIC PANEL
ALT: 7 U/L (ref 0–44)
AST: 17 U/L (ref 15–41)
Albumin: 1.8 g/dL — ABNORMAL LOW (ref 3.5–5.0)
Alkaline Phosphatase: 82 U/L (ref 38–126)
Anion gap: 9 (ref 5–15)
BUN: 22 mg/dL — ABNORMAL HIGH (ref 6–20)
CO2: 27 mmol/L (ref 22–32)
Calcium: 8.4 mg/dL — ABNORMAL LOW (ref 8.9–10.3)
Chloride: 101 mmol/L (ref 98–111)
Creatinine, Ser: 2.55 mg/dL — ABNORMAL HIGH (ref 0.61–1.24)
GFR calc Af Amer: 30 mL/min — ABNORMAL LOW (ref >60)
GFR calc non Af Amer: 26 mL/min — ABNORMAL LOW (ref >60)
Glucose, Bld: 103 mg/dL — ABNORMAL HIGH (ref 70–99)
Potassium: 3.9 mmol/L (ref 3.5–5.1)
Sodium: 137 mmol/L (ref 135–145)
Total Bilirubin: 1 mg/dL (ref 0.3–1.2)
Total Protein: 6.4 g/dL — ABNORMAL LOW (ref 6.5–8.1)

## 2019-05-29 LAB — CBC
HCT: 23.5 % — ABNORMAL LOW (ref 39.0–52.0)
Hemoglobin: 7.4 g/dL — ABNORMAL LOW (ref 13.0–17.0)
MCH: 30.2 pg (ref 26.0–34.0)
MCHC: 31.5 g/dL (ref 30.0–36.0)
MCV: 95.9 fL (ref 80.0–100.0)
Platelets: 140 10*3/uL — ABNORMAL LOW (ref 150–400)
RBC: 2.45 MIL/uL — ABNORMAL LOW (ref 4.22–5.81)
RDW: 16.7 % — ABNORMAL HIGH (ref 11.5–15.5)
WBC: 9.1 10*3/uL (ref 4.0–10.5)
nRBC: 0 % (ref 0.0–0.2)

## 2019-05-29 LAB — GLUCOSE, CAPILLARY
Glucose-Capillary: 93 mg/dL (ref 70–99)
Glucose-Capillary: 103 mg/dL — ABNORMAL HIGH (ref 70–99)
Glucose-Capillary: 103 mg/dL — ABNORMAL HIGH (ref 70–99)
Glucose-Capillary: 116 mg/dL — ABNORMAL HIGH (ref 70–99)

## 2019-05-29 LAB — SARS CORONAVIRUS 2 (TAT 6-24 HRS): SARS Coronavirus 2: NEGATIVE

## 2019-05-29 MED ORDER — OXYCODONE HCL 5 MG PO TABS: 5.0000 mg | ORAL_TABLET | Freq: Three times a day (TID) | ORAL | 0 refills | Status: AC | PRN

## 2019-05-29 MED ORDER — NEPRO/CARBSTEADY PO LIQD
237.0000 mL | Freq: Four times a day (QID) | ORAL | Status: DC
  Administered 2019-05-29 – 2019-05-30 (×4): 237 mL via ORAL

## 2019-05-29 NOTE — Progress Notes (Signed)
HD#8 Subjective:  Overnight Events: Patient still confused overnight.  Matthew Kline is much more oriented today. He knows he is at [facility] and had his right leg amputated. He mentions how frightening it was to be so confused and disoriented.  Discussed plan for pursuing rehab.    Objective:  Vital signs in last 24 hours: Vitals:   05/28/19 1613 05/28/19 2142 05/29/19 0459 05/29/19 1008  BP: 129/80 122/71 125/78 121/80  Pulse: 82 74 74 63  Resp: 18 18 18 18   Temp: 98 F (36.7 C) 99.2 F (37.3 C) 99.3 F (37.4 C) 98.2 F (36.8 C)  TempSrc: Oral Oral Oral Oral  SpO2: 98% 97% 98% 98%  Weight:  62.4 kg    Height:       Supplemental O2: RA  Physical Exam:  Physical Exam  Constitutional: No distress.  HENT:  Head: Normocephalic and atraumatic.  Eyes: EOM are normal.  Cardiovascular: Normal rate and intact distal pulses.  Pulmonary/Chest: Effort normal. No respiratory distress. He has no wheezes.  Abdominal: Soft. He exhibits no distension. There is abdominal tenderness.  Musculoskeletal:        General: Deformity (Bilateral AKA's with wound VAC on right surgical site) present. No edema.     Cervical back: Normal range of motion.  Neurological: He is alert.  Skin: Skin is warm and dry. He is not diaphoretic.    Filed Weights   05/28/19 1220 05/28/19 1528 05/28/19 2142  Weight: 57 kg 56.6 kg 62.4 kg     Intake/Output Summary (Last 24 hours) at 05/29/2019 1223 Last data filed at 05/29/2019 0900 Gross per 24 hour  Intake 1136.72 ml  Output 501 ml  Net 635.72 ml    Pertinent Labs: CBC Latest Ref Rng & Units 05/29/2019 05/28/2019 05/27/2019  WBC 4.0 - 10.5 K/uL 9.1 8.8 9.4  Hemoglobin 13.0 - 17.0 g/dL 7.4(L) 7.4(L) 7.9(L)  Hematocrit 39.0 - 52.0 % 23.5(L) 23.6(L) 24.2(L)  Platelets 150 - 400 K/uL 140(L) 133(L) 128(L)    CMP Latest Ref Rng & Units 05/29/2019 05/28/2019 05/27/2019  Glucose 70 - 99 mg/dL 103(H) 121(H) 155(H)  BUN 6 - 20 mg/dL 22(H) 34(H) 18    Creatinine 0.61 - 1.24 mg/dL 2.55(H) 4.03(H) 2.94(H)  Sodium 135 - 145 mmol/L 137 138 140  Potassium 3.5 - 5.1 mmol/L 3.9 4.1 3.8  Chloride 98 - 111 mmol/L 101 103 105  CO2 22 - 32 mmol/L 27 25 28   Calcium 8.9 - 10.3 mg/dL 8.4(L) 8.5(L) 8.6(L)  Total Protein 6.5 - 8.1 g/dL 6.4(L) - -  Total Bilirubin 0.3 - 1.2 mg/dL 1.0 - -  Alkaline Phos 38 - 126 U/L 82 - -  AST 15 - 41 U/L 17 - -  ALT 0 - 44 U/L 7 - -    Pending Labs: None  Imaging: No results found.    Assessment/Plan:   Principal Problem:   Dehiscence of amputation stump (HCC) Active Problems:   PVD (peripheral vascular disease) ([place])   ESRD on hemodialysis ([place])   Cellulitis and abscess of leg   Severe protein-calorie malnutrition (HCC)   Cellulitis   Cellulitis of right leg   Abscess of right leg    Patient Summary: Ptis a 59-yr-oldmanwith a PMH ofESRD, pancreatic insufficiency, HTN,T2DM, and bilateral LE amputationswho has purulent cellulitis not improving on antibiotics.Right AKA performed. Pt has been persistently confused and emotionally labile during hospitalization that has been improving   P/s right AKA amputation Purulentcellulitis: Patient is on POD 4;  pain well controlled.  He is to finish 6 days of vancomycin.  We will continue pain management with oxycodone 5-10 mg q4 hours prn. Wound vac in place. Anticipate discharge to SNF today or when placement has been secured.  - Continue pain management - Continue wound vac - Ready for discharge   Encephalopathy:  Patients delirium has improved.  ESRD Acute onChronic Anemia: -appreciate nephrology managing inpatient HD -trend renal function panel, CBC  Pancreatic insufficiency: -Creon72,000 units TID  HTN: -Amlodipine5 mg QD -Carvedilol12.5 mg BID  T2DM: -SSI  Diet: Carb-Modified IVF: NS,50cc/hr VTE: Heparin Code: Full PT/OT recs: Pending TOC recs: SNF placement pending   Dispo: Anticipated discharge pending  SNF placement.    Marianna Payment, D.O. MCIMTP, PGY-1 Date 05/29/2019 Time 12:23 PM

## 2019-05-29 NOTE — Progress Notes (Signed)
[facility] Progress Note   Subjective:  Seen in room - no overnight events, no CP/dyspnea. Mood improved.  Objective Vitals:   05/28/19 1613 05/28/19 2142 05/29/19 0459 05/29/19 1008  BP: 129/80 122/71 125/78 121/80  Pulse: 82 74 74 63  Resp: 18 18 18 18   Temp: 98 F (36.7 C) 99.2 F (37.3 C) 99.3 F (37.4 C) 98.2 F (36.8 C)  TempSrc: Oral Oral Oral Oral  SpO2: 98% 97% 98% 98%  Weight:  62.4 kg    Height:       Physical Exam General: Chronically ill appearing male, NAD Heart: RRR, no murmurs, rubs or gallops Lungs: CTA bilaterally without wheezing, rhonchi or rales Abdomen: Soft, non-tender, non-distended, +BS Extremities: Bilateral AKA, Right stump with wound vac, no stump edema Dialysis Access: LUE AVF + thrill  Additional Objective Labs: Basic Metabolic Panel: Recent Labs  Lab 05/25/19 0530 05/25/19 0530 05/26/19 0810 05/26/19 0810 05/27/19 0433 05/28/19 0701 05/29/19 0344  NA 136   < > 137   < > 140 138 137  K 3.5   < > 3.4*   < > 3.8 4.1 3.9  CL 98   < > 100   < > 105 103 101  CO2 26   < > 25   < > 28 25 27   GLUCOSE 91   < > 124*   < > 155* 121* 103*  BUN 21*   < > 30*   < > 18 34* 22*  CREATININE 4.10*   < > 4.93*   < > 2.94* 4.03* 2.55*  CALCIUM 8.7*   < > 8.3*   < > 8.6* 8.5* 8.4*  PHOS 3.6  --  4.2  --  2.3*  --   --    < > = values in this interval not displayed.   Liver Function Tests: Recent Labs  Lab 05/26/19 0810 05/27/19 0433 05/29/19 0344  AST  --   --  17  ALT  --   --  7  ALKPHOS  --   --  82  BILITOT  --   --  1.0  PROT  --   --  6.4*  ALBUMIN 1.6* 1.7* 1.8*   CBC: Recent Labs  Lab 05/23/19 0541 05/24/19 0659 05/25/19 0530 05/25/19 0530 05/26/19 0850 05/26/19 1901 05/27/19 0433 05/28/19 0701 05/29/19 0344  WBC 12.3*   < > 9.9   < > 9.2  --  9.4 8.8 9.1  NEUTROABS 10.7*  --   --   --   --   --   --   --   --   HGB 10.1*   < > 10.0*   < > 7.1*   < > 7.9* 7.4* 7.4*  HCT 31.5*   < > 31.6*   < > 21.9*   <  > 24.2* 23.6* 23.5*  MCV 92.9   < > 93.8  --  92.0  --  93.1 95.5 95.9  PLT 117*   < > 109*   < > 127*  --  128* 133* 140*   < > = values in this interval not displayed.   Blood Culture    Component Value Date/Time   SDES BLOOD RIGHT ARM 05/21/2019 0851   SPECREQUEST  05/21/2019 0851    BOTTLES DRAWN AEROBIC AND ANAEROBIC Blood Culture results may not be optimal due to an excessive volume of blood received in culture bottles   CULT  05/21/2019 0851    NO GROWTH  5 DAYS Performed at [facility], [place] [address] [place], [place] [address]    REPTSTATUS 05/26/2019 FINAL 05/21/2019 0851   Medications: . sodium chloride    . sodium chloride 50 mL/hr at 05/28/19 1951   . amLODipine  5 mg Oral QPM  . aspirin EC  81 mg Oral QHS  . carvedilol  12.5 mg Oral BID WC  . Chlorhexidine Gluconate Cloth  6 each Topical Q0600  . darbepoetin (ARANESP) injection - DIALYSIS  60 mcg Intravenous Q Wed-HD  . docusate sodium  100 mg Oral BID  . feeding supplement (NEPRO CARB STEADY)  237 mL Oral TID BM  . feeding supplement (PRO-STAT SUGAR FREE 64)  30 mL Oral BID  . gabapentin  200 mg Oral Daily  . heparin  5,000 Units Subcutaneous Q8H  . insulin aspart  0-5 Units Subcutaneous QHS  . insulin aspart  0-6 Units Subcutaneous TID WC  . lipase/protease/amylase  72,000 Units Oral TID with meals  . multivitamin  1 tablet Oral QHS  . mupirocin ointment  1 application Nasal BID  . nutrition supplement (JUVEN)  1 packet Oral BID BM  . sertraline  100 mg Oral Daily  . sevelamer carbonate  2,400 mg Oral TID WC    [facility] 180Nre, BFR 400, DFR Auto 1.5, EDW 63.5kg, 2K/2Ca Heparin 4000 unit bolus Venofer 100mg  IV q HD- received 5/10 doses Mircera 100 mcg q 2 weeks- last dose 4/23  Assessment/Plan: 1. R stump cellulitis:Wound Cx grewMSSA, BCx negative - initially on Vancomycin. Large abscess with necrosis of gastrocnemius muscle &tendinous  attachment found during revision and debridement-> now s/p R AKA (5/14). 2. OIB:BCWUGQ/BVQXIH with depressed mood. Head CT 5/15 without acute abnormality. 3. ESRD:Continue HD per MWF schedule - next 5/19. 4. Hypertension/volume:BPstable, below prior EDW. Will lower on discharge. 5. Anemia:Hgb 10-> 7.1 post-op, 1U PRBC given, now 7.4. Getting Aranesp q Wed. 6. Metabolic bone disease: Corrected calcium 10.4, not on VDRA, use low Ca bath. Phos low - Renvela has been reduced. 7. Nutrition:Alb low. Continue Nepro + pro-stat supplements. 8. Diarrhea: Chronic issue, pancreatic insufficiency. On Creon.  Veneta Penton, PA-C 05/29/2019, 10:30 AM  [employer]

## 2019-05-29 NOTE — Plan of Care (Signed)
  Problem: Nutrition: Goal: Adequate nutrition will be maintained Outcome: Progressing   Problem: Pain Managment: Goal: General experience of comfort will improve Outcome: Progressing   

## 2019-05-29 NOTE — TOC Progression Note (Signed)
Transition of Care [facility]) - Progression Note    Patient Details  Name: Matthew Kline MRN: [id number] Date of Birth: [birth year]  Transition of Care [facility]) CM/SW Contact  Sharlet Salina Mila Homer, LCSW Phone Number: 05/29/2019, 4:06 PM  Clinical Narrative:   Patient medically stable for discharge and insurance authorization received from [place]: Reference number [id number], approved for 3 days, eff. 5/18 and next review date 5/20. Care coordinator: Cristobal Goldmann and fax number for facility to send clinical updates is : [phone].   COVID test ordered and patient will discharge to [facility] once results received. Freda Munro, admissions director at [facility] and Rosemarie Beath contacted and informed.      Expected Discharge Plan: [facility] Barriers to Discharge: Continued Medical Work up  Expected Discharge Plan and Services Expected Discharge Plan: [place] In-house Referral: Clinical Social Work   Post Acute Care Choice: [place] Living arrangements for the past 2 months: Single Family Home Expected Discharge Date: 05/29/19                                     Social Determinants of Health (SDOH) Interventions    Readmission Risk Interventions No flowsheet data found.

## 2019-05-29 NOTE — Progress Notes (Signed)
Physical Therapy Treatment Patient Details Name: Matthew Kline MRN: [id number] DOB: 1961-01-05 Today's Date: 05/29/2019    History of Present Illness Matthew Kline is a 59 y.o. male who presents with diabetic insensate neuropathy severe peripheral vascular disease end-stage renal disease on dialysis with severe protein caloric malnutrition.  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  Patient reports a several week acute history of increasing pain and drainage from the right below the knee amputation. Pain and purulent drainage right transtibial amputation. Now s/p transtibial amputation (05/23/19) and above-the-knee amputation 5/14    PT Comments    Continuing work on functional mobility and activity tolerance;  Matthew Kline showed improving cognition, much better able to participate, though still a bit confused; Session focused on functional transfers, and bed mobility; Able to complete an anterior-posterior transfer bed to recliner with +2 Max assist   Follow Up Recommendations  SNF     Equipment Recommendations  (defer to post acute)    Recommendations for Other Services       Precautions / Restrictions Precautions Precautions: Fall Precaution Comments: B AKA Restrictions RLE Weight Bearing: Non weight bearing LLE Weight Bearing: Non weight bearing    Mobility  Bed Mobility Overal bed mobility: Needs Assistance Bed Mobility: Rolling;Supine to Sit Rolling: Mod assist   Supine to sit: Mod assist     General bed mobility comments: modA to progress trunk to long-sitting in bed pt assisted with pulling up on bed rails;modA to roll to for hygeine  Transfers Overall transfer level: Needs assistance Equipment used: (bed pad) Transfers: Comptroller transfers: +2 physical assistance;Max assist   General transfer comment: Max assist of 2 to scoot backwards into recliner; Able to  reach back for armrests, but needing lots of cues  Ambulation/Gait                 Stairs             Wheelchair Mobility    Modified Rankin (Stroke Patients Only)       Balance     Sitting balance-Leahy Scale: Fair Sitting balance - Comments: Able to suport himself in sitting by arm propping, or holding rails; Lots of multimodal cues for R and L leaning with elbow propping to optimally place bed pads for AP transfer; at one point, he laid himself back down imoulsively                                    Cognition Arousal/Alertness: Awake/alert Behavior During Therapy: [facility] for tasks assessed/performed;Restless;Impulsive Overall Cognitive Status: Impaired/Different from baseline Area of Impairment: Following commands;Safety/judgement;Problem solving                       Following Commands: Follows one step commands inconsistently;Follows one step commands with increased time Safety/Judgement: Decreased awareness of safety;Decreased awareness of deficits   Problem Solving: Slow processing;Decreased initiation;Difficulty sequencing;Requires verbal cues;Requires tactile cues        Exercises      General Comments General comments (skin integrity, edema, etc.): Noted slight incontinence of bowel upon arrival, and continued redness and discomfort perineum, perianal, and scrotal areas; Intense discomfort when we attempted to adjust bed pads      Pertinent Vitals/Pain Pain Assessment: Faces Faces Pain Scale: Hurts whole lot Pain Location: perianal and perineal areas  Pain Descriptors /  Indicators: Grimacing Pain Intervention(s): Monitored during session;Other (comment)(cleaned and applied barrier cream)    Home Living                      Prior Function            PT Goals (current goals can now be found in the care plan section) Acute Rehab PT Goals Patient Stated Goal: did not state a discernable goal PT Goal  Formulation: Patient unable to participate in goal setting Time For Goal Achievement: 06/06/19 Potential to Achieve Goals: Fair Progress towards PT goals: Progressing toward goals    Frequency    Min 2X/week      PT Plan Current plan remains appropriate    Co-evaluation              AM-PAC PT "6 Clicks" Mobility   Outcome Measure  Help needed turning from your back to your side while in a flat bed without using bedrails?: A Little Help needed moving from lying on your back to sitting on the side of a flat bed without using bedrails?: A Lot Help needed moving to and from a bed to a chair (including a wheelchair)?: A Lot Help needed standing up from a chair using your arms (e.g., wheelchair or bedside chair)?: Total Help needed to walk in hospital room?: Total Help needed climbing 3-5 steps with a railing? : Total 6 Click Score: 10    End of Session Equipment Utilized During Treatment: (none today) Activity Tolerance: Patient tolerated treatment well Patient left: in chair;with call bell/phone within reach;with chair alarm set Nurse Communication: Mobility status PT Visit Diagnosis: Other abnormalities of gait and mobility (R26.89);Pain Pain - Right/Left: Right Pain - part of body: Leg     Time: 9295-7473 PT Time Calculation (min) (ACUTE ONLY): 33 min  Charges:  $Therapeutic Activity: 23-37 mins                     Roney Marion, PT  Acute Rehabilitation Services Pager [phone] Office [place] 05/29/2019, 2:59 PM

## 2019-05-29 NOTE — Discharge Summary (Addendum)
Name: Matthew Kline MRN: [id number] DOB: 1960-08-30 59 y.o. PCP: [facility] [place]  Date of Admission: 05/21/2019  7:16 AM Date of Discharge: 05/29/2019 Attending Physician: Velna Ochs, MD  Discharge Diagnosis: 1.  Purulent cellulitis of right BKA stump 2.  ESRD 3.  Pancreatic insufficiency 4.  Encephalopathy  Discharge Medications: Allergies as of 05/29/2019      Reactions   Morphine And Related Other (See Comments)   hallucinations   Tygacil [tigecycline] Nausea And Vomiting, Other (See Comments)   Imodium [loperamide] Itching      Medication List    TAKE these medications   acetaminophen 500 MG tablet Commonly known as: TYLENOL Take 500-1,000 mg by mouth every 6 (six) hours as needed for mild pain or fever.   amLODipine 5 MG tablet Commonly known as: NORVASC Take 5 mg by mouth every evening.   aspirin 81 MG EC tablet Take 2 tablets (162 mg total) by mouth daily. What changed:   how much to take  when to take this   bisacodyl 10 MG suppository Commonly known as: DULCOLAX Place 1 suppository (10 mg total) rectally daily as needed for moderate constipation.   carvedilol 12.5 MG tablet Commonly known as: COREG Take 1 tablet (12.5 mg total) by mouth 2 (two) times daily with a meal.   Creon 36000 UNITS Cpep capsule Generic drug: lipase/protease/amylase Take 1-2 capsules by mouth See admin instructions. Take 2 capsules by mouth with meals and 1 capsule before snacks   Darbepoetin Alfa 150 MCG/0.3ML Sosy injection Commonly known as: ARANESP Inject 0.3 mLs (150 mcg total) into the vein every Friday with hemodialysis.   docusate sodium 100 MG capsule Commonly known as: COLACE Take 1 capsule (100 mg total) by mouth 2 (two) times daily.   feeding supplement (PRO-STAT SUGAR FREE 64) Liqd Take 30 mLs by mouth 2 (two) times daily.   gabapentin 100 MG capsule Commonly known as: NEURONTIN Take 200 mg by mouth daily.   heparin sodium (porcine)  1000 UNIT/ML injection Inject 4 mLs into the vein See admin instructions. Every hemodialysis treatment   hydrOXYzine 50 MG tablet Commonly known as: ATARAX/VISTARIL Take 50 mg by mouth 3 (three) times daily as needed for itching.   menthol-cetylpyridinium 3 MG lozenge Commonly known as: CEPACOL Take 1 lozenge (3 mg total) by mouth as needed for sore throat.   methocarbamol 500 MG tablet Commonly known as: ROBAXIN Take 1 tablet (500 mg total) by mouth every 6 (six) hours as needed for muscle spasms.   multivitamin Tabs tablet Take 1 tablet by mouth at bedtime.   oxyCODONE 5 MG immediate release tablet Commonly known as: Roxicodone Take 1 tablet (5 mg total) by mouth every 8 (eight) hours as needed for up to 7 days. What changed:   when to take this  reasons to take this   polyethylene glycol 17 g packet Commonly known as: MIRALAX / GLYCOLAX Take 17 g by mouth daily as needed for mild constipation.   sertraline 100 MG tablet Commonly known as: ZOLOFT Take 100 mg by mouth daily.   Singulair 10 MG tablet Generic drug: montelukast Take 10 mg by mouth at bedtime as needed (allergies).   temazepam 30 MG capsule Commonly known as: RESTORIL Take 30 mg by mouth at bedtime.       Disposition and follow-up:   Mr.Godwin Niccoli was discharged from [facility] in [place] condition.  At the hospital follow up visit please address:  1.  Follow-up: Diabetes make  sure patient is on appropriate diabetic medication.  For his AKA make sure patient is following up with orthopedics.  2.  Labs / imaging needed at time of follow-up: bmp  3.  Pending labs/ test needing follow-up: none  Follow-up Appointments: Follow-up Information    Persons, Bevely [place], [place] In 1 week.   Specialty: Orthopedic Surgery Contact information: [place] [address] [place], [place] Follow up in 1 week(s).   Contact information: [place] [id number] [phone]        Wellington Hampshire, MD .   Specialty: Cardiology Contact information: [address] [place] [address] [phone]           Hospital Course by problem list: 1. R BKA stump abscess: Patient presented with right BKA stump cellulitis.  Was initially treated with IV vancomycin but did not show significant improvement.  Orthopedics took the patient to the OR and found necrotic tissue and calf muscles on 5/12.  Patient was taken back to the OR on 5/14 where he underwent AKA with wound wound VAC placement.  2. ESRD: Patient received HD on his home schedule as he was hospitalized. -Continue outpatient HD  3. Alerted mental status: At baseline, the patient is not confused per his son.  However, during his hospitalization the patient was very confused.  He was unable to answer orientation questions despite withholding all of his centrally acting medications.  Discharge Vitals:   BP 121/80 (BP Location: Right Arm)   Pulse 63   Temp 98.2 F (36.8 C) (Oral)   Resp 18   Ht 5' 10.98" (1.803 m)   Wt 62.4 kg   SpO2 98%   BMI 19.20 kg/m  Physical Exam  Constitutional: He is oriented to person, place, and time and well-developed, well-nourished, and in no distress.  HENT:  Head: Normocephalic and atraumatic.  Eyes: EOM are normal.  Cardiovascular: Normal rate and intact distal pulses.  Pulmonary/Chest: Effort normal and breath sounds normal. No respiratory distress.  Abdominal: Soft. He exhibits no distension. There is no abdominal tenderness.  Musculoskeletal:        General: Tenderness (at surgical site on right AKA stump. Wound vac in place.) and deformity (bilateral AKA) present. Normal range of motion.     Cervical back: Normal range of motion.  Neurological: He is alert and oriented to person, place, and time.  Skin: Skin is warm and dry.     Pertinent Labs, Studies, and Procedures:  CBC Latest Ref Rng &  Units 05/29/2019 05/28/2019 05/27/2019  WBC 4.0 - 10.5 K/uL 9.1 8.8 9.4  Hemoglobin 13.0 - 17.0 g/dL 7.4(L) 7.4(L) 7.9(L)  Hematocrit 39.0 - 52.0 % 23.5(L) 23.6(L) 24.2(L)  Platelets 150 - 400 K/uL 140(L) 133(L) 128(L)    CMP Latest Ref Rng & Units 05/29/2019 05/28/2019 05/27/2019  Glucose 70 - 99 mg/dL 103(H) 121(H) 155(H)  BUN 6 - 20 mg/dL 22(H) 34(H) 18  Creatinine 0.61 - 1.24 mg/dL 2.55(H) 4.03(H) 2.94(H)  Sodium 135 - 145 mmol/L 137 138 140  Potassium 3.5 - 5.1 mmol/L 3.9 4.1 3.8  Chloride 98 - 111 mmol/L 101 103 105  CO2 22 - 32 mmol/L 27 25 28   Calcium 8.9 - 10.3 mg/dL 8.4(L) 8.5(L) 8.6(L)  Total Protein 6.5 - 8.1 g/dL 6.4(L) - -  Total Bilirubin 0.3 - 1.2 mg/dL 1.0 - -  Alkaline Phos 38 - 126 U/L 82 - -  AST 15 - 41 U/L 17 - -  ALT 0 - 44 U/L 7 - -    Discharge Instructions: Discharge Instructions    Diet - low sodium heart healthy   Complete by: As directed    Discharge instructions   Complete by: As directed    You were hospitalized for Cellulitis. Thank you for allowing [place] to be part of your care.   Please follow up with you: 1. Primary care provider 2. Orthopedics 3. Nephrology  Please note these changes made to your medications: See after visit summary  Please make sure to follow up with your providers   Please call our clinic if you have any questions or concerns, we may be able to help and keep you from a long and expensive emergency room wait. Our clinic and after hours phone number is [phone], the best time to call is Monday through Friday 9 am to 4 pm but there is always someone available 24/7 if you have an emergency. If you need medication refills please notify your pharmacy one week in advance and they will send [place] a request.   Increase activity slowly   Complete by: As directed    Negative Pressure Wound Therapy - Incisional   Complete by: As directed    Show patient how to attach prevena pump      Signed: Marianna Payment, MD 05/29/2019, 3:41 PM     Pager: [phone]

## 2019-05-29 NOTE — Progress Notes (Signed)
Nutrition Follow-up  DOCUMENTATION CODES:   Severe malnutrition in context of chronic illness  INTERVENTION:  -Nepro shake po QID -Continue Juven BID -Continue 26ml Pro-stat BID   NUTRITION DIAGNOSIS:   Severe Malnutrition related to chronic illness as evidenced by severe muscle depletion, severe fat depletion.  Ongoing  GOAL:   Patient will meet greater than or equal to 90% of their needs  Progressing  MONITOR:   PO intake, Supplement acceptance, Skin, Labs, I & O's  REASON FOR ASSESSMENT:   Other (Comment)(wound healing)    ASSESSMENT:   Pt with a PMH significant for ESRD, pancreatic insufficiency, HTN, T2DM, and BLE amputations who presented with worsening pain, erthema, and purulent discharge from RLE BKA. Pt found to have a large abscess that extended proximal to the popliteal fossa with necrosis of the gastrocnemius muscle and tendinous attachment  5/12 s/p revision R transtibial amputation 5/14 s/p R AKA  Pt unavailable at time of RD visit.  Pt pending SNF placement.   PO Intake: 0-25% x last 8 recorded meals (15% average intake)  Last HD 5/17 Net UF 531ml  EDW: 63.5 kg Per nephrology, EDW will be lowered upon discharge.   Labs reviewed:CBGs 85-106 Medications reviewed and include: Aranesp, Colace, Nepro po TID, 66ml Prostat BID, Novolog, Creon, Renavit, Juven BID, Renvela   Diet Order:   Diet Order            Diet Carb Modified Fluid consistency: Thin; Room service appropriate? Yes  Diet effective now              EDUCATION NEEDS:   Not appropriate for education at this time  Skin:  Skin Assessment: Skin Integrity Issues: Skin Integrity Issues:: Incisions, Other (Comment) Incisions: R leg, abdomen Other: non-pressure wound R stump  Last BM:  5/17 type 6  Height:   Ht Readings from Last 1 Encounters:  05/23/19 5' 10.98" (1.803 m)    Weight:   Wt Readings from Last 1 Encounters:  05/28/19 62.4 kg   BMI:  Body mass index is  19.2 kg/m.  Estimated Nutritional Needs:   Kcal:  1900-2100  Protein:  95-110 grams  Fluid:  1010ml + UOP    Larkin Ina, MS, RD, LDN RD pager number and weekend/on-call pager number located in [place].

## 2019-05-30 LAB — RENAL FUNCTION PANEL
Albumin: 1.8 g/dL — ABNORMAL LOW (ref 3.5–5.0)
Anion gap: 8 (ref 5–15)
BUN: 40 mg/dL — ABNORMAL HIGH (ref 6–20)
CO2: 26 mmol/L (ref 22–32)
Calcium: 8.4 mg/dL — ABNORMAL LOW (ref 8.9–10.3)
Chloride: 100 mmol/L (ref 98–111)
Creatinine, Ser: 3.71 mg/dL — ABNORMAL HIGH (ref 0.61–1.24)
GFR calc Af Amer: 19 mL/min — ABNORMAL LOW (ref >60)
GFR calc non Af Amer: 17 mL/min — ABNORMAL LOW (ref >60)
Glucose, Bld: 92 mg/dL (ref 70–99)
Phosphorus: 3.3 mg/dL (ref 2.5–4.6)
Potassium: 4 mmol/L (ref 3.5–5.1)
Sodium: 134 mmol/L — ABNORMAL LOW (ref 135–145)

## 2019-05-30 LAB — CBC
HCT: 23.4 % — ABNORMAL LOW (ref 39.0–52.0)
Hemoglobin: 7.3 g/dL — ABNORMAL LOW (ref 13.0–17.0)
MCH: 30.4 pg (ref 26.0–34.0)
MCHC: 31.2 g/dL (ref 30.0–36.0)
MCV: 97.5 fL (ref 80.0–100.0)
Platelets: 150 10*3/uL (ref 150–400)
RBC: 2.4 MIL/uL — ABNORMAL LOW (ref 4.22–5.81)
RDW: 16.7 % — ABNORMAL HIGH (ref 11.5–15.5)
WBC: 8 10*3/uL (ref 4.0–10.5)
nRBC: 0 % (ref 0.0–0.2)

## 2019-05-30 LAB — GLUCOSE, CAPILLARY
Glucose-Capillary: 87 mg/dL (ref 70–99)
Glucose-Capillary: 68 mg/dL — ABNORMAL LOW (ref 70–99)
Glucose-Capillary: 95 mg/dL (ref 70–99)
Glucose-Capillary: 117 mg/dL — ABNORMAL HIGH (ref 70–99)

## 2019-05-30 MED ORDER — DARBEPOETIN ALFA 60 MCG/0.3ML IJ SOSY
PREFILLED_SYRINGE | INTRAMUSCULAR | Status: AC
  Administered 2019-05-30: 60 ug via INTRAVENOUS
  Filled 2019-05-30: qty 0.3

## 2019-05-30 NOTE — Progress Notes (Signed)
textpaged Internal Medicine on call night intern to make aware patient is still in our unit. Covid test result pending  before D/C to [facility]. ,received a call back from MD Court Joy, he is aware.

## 2019-05-30 NOTE — TOC Transition Note (Signed)
Transition of Care [facility]) - CM/SW Discharge Note *Discharged to Between *Phone number for report - [phone]   Patient Details  Name: Matthew Kline MRN: [id number] Date of Birth: 1960-02-15  Transition of Care [facility]) CM/SW Contact:  Sable Feil, LCSW Phone Number: 05/30/2019, 1:43 PM   Clinical Narrative:  COVID test results received (negative) and patient medically stable for discharge. Facility advised once COVID resulted and was provided with discharge clinicals. Nurse provided with information to call report. Non-emergency transport arranged and son contacted regarding discharge.    Final next level of care: Skilled Nursing Facility([facility]) Barriers to Discharge: Barriers Resolved   Patient Goals and CMS Choice Patient states their goals for this hospitalization and ongoing recovery are:: Patient agreeable to ST rehab before returning home CMS Medicare.gov Compare Post Acute Care list provided to:: Other (Comment Required)(Did not talk with patient regarding Medicare.gov due to confusion) Choice offered to / list presented to : Adult Children(Talked with son Merrily Pew)  Discharge Placement   Existing PASRR number confirmed : 05/28/19          Patient [facility] bed at: [facility] Patient to be transferred to facility by: Non-emergency ambulance Name of family member notified: Jaqwon Manfred; [phone] Patient and family notified of of transfer: 05/30/19  Discharge Plan and Services In-house Referral: Clinical Social Work   Post Acute Care Choice: [place]                              Social Determinants of Health (SDOH) Interventions  No SDOH interventions requested or needed prior to discharge.   Readmission Risk Interventions No flowsheet data found.

## 2019-05-30 NOTE — Progress Notes (Signed)
Matthew Kline to be discharged Skilled nursing facility per MD order. Discussed prescriptions and follow up appointments with the patient. Prescriptions given to patient; medication list explained in detail. Patient verbalized understanding.  Skin clean, dry and intact without evidence of skin break down, no evidence of skin tears noted. IV catheter discontinued intact. Site without signs and symptoms of complications. Dressing and pressure applied. Pt denies pain at the site currently. No complaints noted.  Patient free of lines, drains.   An After Visit Summary (AVS) was printed and given to the patient. Patient escorted via stretcher, and discharged to [facility] via [place].  Rhett Najera Nghus  Afsheen Antony, RN

## 2019-05-30 NOTE — Care Management Important Message (Signed)
Important Message  Patient Details  Name: Matthew Kline MRN: [id number] Date of Birth: 10-23-1960   Medicare Important Message Given:  Yes     [name] Montine Circle 05/30/2019, 12:50 PM

## 2019-05-30 NOTE — Progress Notes (Signed)
Patient is 7 days status post above-knee amputation.  Overall he is doing well.  Plan is for discharge to a skilled nursing facility.  Wound VAC had 0 in canister and was removed by myself today.  Incision is healing nicely with well opposed wound edges.  Very little bleeding.  A dry dressing was placed.  Dressing should be changed daily may get incision wet with soap and water

## 2019-05-30 NOTE — Progress Notes (Signed)
[facility] Progress Note   Subjective:  Yesterday's discharge delayed until negative covid test - now completed. Seen on HD now - 1L net UFG and tolerating. No new concerns - assume will d/c to SNF later today.  Objective Vitals:   05/30/19 0710 05/30/19 0722 05/30/19 0730 05/30/19 0800  BP: 133/74 132/74 132/75 (!) 153/58  Pulse: 66 64 66 66  Resp: 18     Temp: 98.1 F (36.7 C)     TempSrc: Oral     SpO2: 98%     Weight: 57.5 kg     Height:       Physical Exam General:Chronically ill appearing male, NAD Heart:RRR, no murmurs, rubs or gallops Lungs:CTA bilaterally without wheezing, rhonchi or rales Abdomen:Soft, non-tender, non-distended, +BS Extremities:Bilateral AKA, Right stump with wound vac, no stump edema Dialysis Access:LUE AVF + thrill  Additional Objective Labs: Basic Metabolic Panel: Recent Labs  Lab 05/25/19 0530 05/25/19 0530 05/26/19 0810 05/26/19 0810 05/27/19 0433 05/28/19 0701 05/29/19 0344  NA 136   < > 137   < > 140 138 137  K 3.5   < > 3.4*   < > 3.8 4.1 3.9  CL 98   < > 100   < > 105 103 101  CO2 26   < > 25   < > 28 25 27   GLUCOSE 91   < > 124*   < > 155* 121* 103*  BUN 21*   < > 30*   < > 18 34* 22*  CREATININE 4.10*   < > 4.93*   < > 2.94* 4.03* 2.55*  CALCIUM 8.7*   < > 8.3*   < > 8.6* 8.5* 8.4*  PHOS 3.6  --  4.2  --  2.3*  --   --    < > = values in this interval not displayed.   Liver Function Tests: Recent Labs  Lab 05/26/19 0810 05/27/19 0433 05/29/19 0344  AST  --   --  17  ALT  --   --  7  ALKPHOS  --   --  82  BILITOT  --   --  1.0  PROT  --   --  6.4*  ALBUMIN 1.6* 1.7* 1.8*   CBC: Recent Labs  Lab 05/26/19 0850 05/26/19 1901 05/27/19 0433 05/27/19 0433 05/28/19 0701 05/29/19 0344 05/30/19 0645  WBC 9.2  --  9.4   < > 8.8 9.1 8.0  HGB 7.1*   < > 7.9*   < > 7.4* 7.4* 7.3*  HCT 21.9*   < > 24.2*   < > 23.6* 23.5* 23.4*  MCV 92.0  --  93.1  --  95.5 95.9 97.5  PLT 127*  --  128*   < >  133* 140* 150   < > = values in this interval not displayed.   CBG: Recent Labs  Lab 05/29/19 0637 05/29/19 1139 05/29/19 1625 05/29/19 2211 05/30/19 0631  GLUCAP 93 103* 103* 116* 87   Medications: . sodium chloride    . sodium chloride 50 mL/hr at 05/28/19 1951   . amLODipine  5 mg Oral QPM  . aspirin EC  81 mg Oral QHS  . carvedilol  12.5 mg Oral BID WC  . Chlorhexidine Gluconate Cloth  6 each Topical Q0600  . darbepoetin (ARANESP) injection - DIALYSIS  60 mcg Intravenous Q Wed-HD  . docusate sodium  100 mg Oral BID  . feeding supplement (NEPRO CARB STEADY)  237 mL Oral  QID  . feeding supplement (PRO-STAT SUGAR FREE 64)  30 mL Oral BID  . gabapentin  200 mg Oral Daily  . heparin  5,000 Units Subcutaneous Q8H  . insulin aspart  0-5 Units Subcutaneous QHS  . insulin aspart  0-6 Units Subcutaneous TID WC  . lipase/protease/amylase  72,000 Units Oral TID with meals  . multivitamin  1 tablet Oral QHS  . nutrition supplement (JUVEN)  1 packet Oral BID BM  . sertraline  100 mg Oral Daily  . sevelamer carbonate  2,400 mg Oral TID WC    [facility], BFR 400, DFR Auto 1.5, EDW 63.5kg, 2K/2Ca Heparin 4000 unit bolus Venofer 100mg  IV q HD- received 5/10 doses Mircera 100 mcg q 2 weeks- last dose 4/23  Assessment/Plan: 1. R stump cellulitis:Wound Cx grewMSSA, BCx negative - initiallyon Vancomycin. 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 2. YIF:OYDXAJ/OINOMV with depressed mood. Head CT 5/15 without acute abnormality. 3. ESRD:Continue HD per MWF schedule - HD today. 4. Hypertension/volume:BPstable, below prior EDW. Will lower on discharge. 5. Anemia:Hgb 10-> 7.1 post-op, 1U PRBC given, now 7.3. Getting Aranesp q Wed. 6. Metabolic bone disease:Corrected calcium 10.4, not on VDRA, use low Ca bath. Phos low -  Renvela has been reduced. 7. Nutrition:Alb low. Continue Nepro + pro-stat supplements. 8. Diarrhea: Chronic issue, pancreatic insufficiency. On Creon.  Veneta Penton, PA-C 05/30/2019, 8:22 AM  [employer]

## 2019-06-01 ENCOUNTER — Encounter (HOSPITAL_COMMUNITY): Payer: Self-pay

## 2019-06-01 ENCOUNTER — Emergency Department (HOSPITAL_COMMUNITY)
Admission: EM | Admit: 2019-06-01 | Discharge: 2019-06-01 | Disposition: A | Discharge status: Skilled Nursing Facility | Payer: Medicare HMO | Attending: Emergency Medicine | Admitting: Emergency Medicine

## 2019-06-01 ENCOUNTER — Emergency Department (HOSPITAL_COMMUNITY): Payer: Medicare HMO

## 2019-06-01 ENCOUNTER — Other Ambulatory Visit: Payer: Self-pay

## 2019-06-01 DIAGNOSIS — S12290A Other displaced fracture of third cervical vertebra, initial encounter for closed fracture: Secondary | ICD-10-CM | POA: Insufficient documentation

## 2019-06-01 DIAGNOSIS — Z7984 Long term (current) use of oral hypoglycemic drugs: Secondary | ICD-10-CM | POA: Diagnosis not present

## 2019-06-01 DIAGNOSIS — S0990XA Unspecified injury of head, initial encounter: Secondary | ICD-10-CM | POA: Diagnosis present

## 2019-06-01 DIAGNOSIS — Y999 Unspecified external cause status: Secondary | ICD-10-CM | POA: Insufficient documentation

## 2019-06-01 DIAGNOSIS — W06XXXA Fall from bed, initial encounter: Secondary | ICD-10-CM | POA: Insufficient documentation

## 2019-06-01 DIAGNOSIS — E1122 Type 2 diabetes mellitus with diabetic chronic kidney disease: Secondary | ICD-10-CM | POA: Insufficient documentation

## 2019-06-01 DIAGNOSIS — N186 End stage renal disease: Secondary | ICD-10-CM | POA: Diagnosis not present

## 2019-06-01 DIAGNOSIS — I132 Hypertensive heart and chronic kidney disease with heart failure and with stage 5 chronic kidney disease, or end stage renal disease: Secondary | ICD-10-CM | POA: Insufficient documentation

## 2019-06-01 DIAGNOSIS — I509 Heart failure, unspecified: Secondary | ICD-10-CM | POA: Insufficient documentation

## 2019-06-01 DIAGNOSIS — Z992 Dependence on renal dialysis: Secondary | ICD-10-CM | POA: Diagnosis not present

## 2019-06-01 DIAGNOSIS — Y92122 Bedroom in nursing home as the place of occurrence of the external cause: Secondary | ICD-10-CM | POA: Diagnosis not present

## 2019-06-01 DIAGNOSIS — Y9389 Activity, other specified: Secondary | ICD-10-CM | POA: Insufficient documentation

## 2019-06-01 DIAGNOSIS — S0083XA Contusion of other part of head, initial encounter: Secondary | ICD-10-CM | POA: Insufficient documentation

## 2019-06-01 NOTE — ED Triage Notes (Signed)
Arrived by EMS from [facility]. Facility reports patient fell out of bed; found on floor fall unwitnessed. Patient has hematoma on right forehead and abrasion on left neck. Baseline mentation unknown; called patient's son who was poor historian

## 2019-06-01 NOTE — ED Provider Notes (Signed)
[facility] DEPT Provider Note   CSN: [id number] Arrival date & time: 06/01/19  0019     History Chief Complaint  Patient presents with  . Fall  . Head Injury    Matthew Kline is a 59 y.o. male.  The history is provided by medical records and the patient.  Fall  Head Injury   59 year old male with history of anemia, anxiety, arthritis, CHF, chronic back pain, diabetes, end-stage renal disease on hemodialysis, hyperlipidemia, hypertension, presenting to the ED after a fall.  Patient resides at SNF, reportedly he rolled out of bed.  He does have contusion to right forehead.  Facility is unsure of loss of consciousness.  He is on daily aspirin but no other anticoagulation.  C-collar in place on arrival.  Past Medical History:  Diagnosis Date  . Anemia, unspecified   . Anxiety   . Arthralgia 2010   polyarticular  . Arthritis    "back, knees" (01/10/2017)  . Cancer [facility])    "kidney area" (01/10/2017)  . CHF (congestive heart failure) ([place]) 07/25/2009   denies  . Chronic lower back pain   . Coronary artery disease   . Coughing    pt. reports that he has drainage from sinus infection  . Diabetic foot ulcer ([place])   . Diabetic neuropathy ([place])   . ESRD (end stage renal disease) on dialysis [facility])    started 12/2012; "MWF; [place] "  (01/10/2017)  . GERD (gastroesophageal reflux disease)    hx "before I lost weight", no problem 9 years  . Hemodialysis access site with mature fistula ([place])   . Hemorrhoids, internal 10/2011   small  . High cholesterol   . History of blood transfusion    "related to the anemia"  . Hypertension   . Insomnia, unspecified   . Lacunar infarction ([place]) 2006   RUE/RLE, speech  . Long term (current) use of anticoagulants   . Myocardial infarction ([place]) 1995  . Orthostatic hypotension   . Osteomyelitis of foot, left, acute ([place])   . Other chronic postoperative pain   . Pneumonia    "probably twice"  (01/10/2017)  . Polymyalgia rheumatica ([place])   . Renal insufficiency   . Sleep apnea    "lost weight; no more problem" (01/10/2017)  . Stroke ([place]) 01/10/06   denies residual on 05/09/2014  . Type II diabetes mellitus ([place]) dx'd 1995  . Unspecified hereditary and idiopathic peripheral neuropathy    feet  . Unspecified osteomyelitis, site unspecified   . Unspecified vitamin D deficiency     Patient Active Problem List   Diagnosis Date Noted  . Cellulitis of right leg   . Dehiscence of amputation stump ([place])   . Abscess of right leg   . Cellulitis 05/21/2019  . Below-knee amputation of right lower extremity ([place])   . Abscess of tendon sheath, right ankle and foot   . Diabetic polyneuropathy associated with type 2 diabetes mellitus ([place])   . Severe protein-calorie malnutrition ([place])   . Cellulitis and abscess of leg 12/29/2018  . Cellulitis of scrotum 11/27/2018  . Sepsis ([place]) 11/24/2018  . Thrombocytopenia ([place]) 11/24/2018  . Bilateral inguinal hernia 11/21/2018  . Biliary colic 69/48/5462  . Acute cholecystitis 07/31/2018  . Prolonged QT interval 07/31/2018  . Acute respiratory failure with hypoxia ([place]) 07/10/2018  . Abdominal pain 07/10/2018  . Bilateral recurrent inguinal hernia without obstruction or gangrene   . Altered mental status   . Cerebral thrombosis with cerebral  infarction 02/06/2017  . Cerebral embolism with cerebral infarction 02/06/2017  . Hx of AKA (above knee amputation), left ([place])   . Pressure injury of skin 01/30/2017  . Acute lower UTI 01/29/2017  . Acute metabolic encephalopathy 62/69/4854  . UTI (urinary tract infection) 01/29/2017  . Quadriceps muscle rupture, left, initial encounter   . Fall 01/09/2017  . Gait disturbance 01/09/2017  . Staphylococcus aureus infection 01/09/2017  . Infection of prosthetic left knee joint ([place]) 01/09/2017  . Fever, unknown origin 11/09/2016  . Critical lower limb ischemia 08/25/2016  . Ulcer of left midfoot  with fat layer exposed ([place]) 08/13/2016  . Diabetic ulcer of left midfoot associated with type 2 diabetes mellitus, with fat layer exposed ([place]) 07/25/2016  . Peripheral neuropathy 07/22/2016  . Tobacco abuse 07/22/2016  . CAD in native artery 05/18/2016  . CAD, multiple vessel 05/11/2016  . Positive cardiac stress test 05/11/2016  . Abnormal stress test 04/30/2016  . Pre-transplant evaluation for kidney transplant 04/30/2016  . S/P revision of total knee 11/26/2015  . Pain in the chest   . Acute on chronic diastolic heart failure ([place]) 07/05/2015  . Volume overload 07/04/2015  . Shortness of breath 07/04/2015  . Hypoxemia 07/04/2015  . Elevated troponin   . ESRD on hemodialysis ([place])   . Hypervolemia   . 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 Pyogenic bacterial arthritis of knee, left ([place]) 08/07/2014  . Tachycardia 07/24/2014  . Acute upper respiratory infection 07/24/2014  . ESRD on dialysis ([place]) 07/14/2014  . Type II diabetes mellitus ([place]) 07/14/2014  . Anemia in chronic kidney disease 07/14/2014  . Congestive heart failure (CHF) ([place]) 07/13/2014  . Surgical wound dehiscence 05/09/2014  . Dehiscence of closure of skin 05/09/2014  . Total knee replacement status 04/10/2014  . Diabetes mellitus with renal manifestations, controlled ([place]) 10/24/2013  . Hypertensive renal disease 06/27/2013  . DM type 2 causing vascular disease ([place]) 06/27/2013  . Erectile dysfunction 06/27/2013  . Depression 06/27/2013  . PVD (peripheral vascular disease) ([place]) 12/19/2012  . Essential hypertension, benign 12/19/2012  . Sinusitis, acute maxillary 11/22/2012  . Otitis, externa, infective 11/14/2012  . Leg edema, left 11/14/2012  . End stage renal disease ([place]) 10/02/2012  . Controlled type 2 DM with proteinuria or microalbuminuria 09/19/2012  . GERD (gastroesophageal reflux disease) 09/19/2012  . Leukocytosis 09/19/2012  . Lacunar infarction ([place]) 08/17/2012  . Polymyalgia  rheumatica ([place]) 08/17/2012  . 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 Essential hypertension 05/10/2012  . Vitamin D deficiency 05/10/2012  . Diabetes mellitus due to underlying condition ([place]) 05/10/2012  . Hyperlipidemia LDL goal <100 05/10/2012  . Anemia of chronic disease 05/10/2012  . Screening for prostate cancer 05/10/2012  . Chronic kidney disease (CKD), stage IV (severe) ([place]) 05/10/2012  . Peripheral autonomic neuropathy due to DM ([place]) 05/10/2012  . Callus of foot 05/10/2012  . Urgency of urination 05/10/2012  . Hyperkalemia 05/10/2012  . Candidiasis of the esophagus 10/12/2011  . Internal hemorrhoids without mention of complication 62/70/3500  . Pre-syncope 07/25/2009  . DJD (degenerative joint disease) of cervical spine 02/17/2009    Past Surgical History:  Procedure Laterality Date  . ABDOMINAL AORTOGRAM N/A 08/25/2016   Procedure: ABDOMINAL AORTOGRAM;  Surgeon: Wellington Hampshire, MD;  Location: [place] CV [facility];  Service: Cardiovascular;  Laterality: N/A;  . AMPUTATION  01/21/2012   Procedure: AMPUTATION RAY;  Surgeon: [name], MD;  Location: [place];  Service: Orthopedics;  Laterality: Left;  Left Foot  4th Ray Amputation  . AMPUTATION Left 05/04/2013   Procedure: AMPUTATION DIGIT;  Surgeon: [name], MD;  Location: [facility];  Service: Orthopedics;  Laterality: Left;  Left Great Toe Amputation at MTP  . AMPUTATION Left 01/14/2017   Procedure: AMPUTATION ABOVE LEFT KNEE;  Surgeon: [name], MD;  Location: [facility];  Service: Orthopedics;  Laterality: Left;  . AMPUTATION Right 12/31/2018   Procedure: AMPUTATION BELOW KNEE;  Surgeon: [name], MD;  Location: [facility];  Service: Orthopedics;  Laterality: Right;  . AMPUTATION Right 05/25/2019   Procedure: RIGHT ABOVE KNEE AMPUTATION;  Surgeon: [name], MD;  Location: [facility];  Service: Orthopedics;  Laterality: Right;  . ANTERIOR CERVICAL DECOMP/DISCECTOMY FUSION  02/2011  . APPLICATION OF WOUND VAC Right  12/31/2018   Procedure: Application Of Wound Vac;  Surgeon: [name], MD;  Location: [facility];  Service: Orthopedics;  Laterality: Right;  . BACK SURGERY    . BASCILIC VEIN TRANSPOSITION Left 10/19/2012   Procedure: BASCILIC VEIN TRANSPOSITION;  Surgeon: Serafina Mitchell, MD;  Location: [facility];  Service: Vascular;  Laterality: Left;  . CARDIAC CATHETERIZATION     "before bypass"  . CHOLECYSTECTOMY N/A 08/02/2018   Procedure: LAPAROSCOPIC CHOLECYSTECTOMY WITH INTRAOPERATIVE CHOLANGIOGRAM;  Surgeon: Donnie Mesa, MD;  Location: [facility];  Service: General;  Laterality: N/A;  . CORONARY ARTERY BYPASS GRAFT     x 5 with lima at [place] SPACERS Left 08/07/2014   Procedure: Replace Left Total Knee Arthroplasty,  Place Antibiotic Spacer;  Surgeon: [name], MD;  Location: [facility];  Service: Orthopedics;  Laterality: Left;  . I & D EXTREMITY Left 05/09/2014   Procedure: Irrigation and Debridement Left Knee and Closure of Total Knee Arthroplasty Incision;  Surgeon: [name], MD;  Location: [facility];  Service: Orthopedics;  Laterality: Left;  . I & D KNEE WITH POLY EXCHANGE Left 05/31/2014   Procedure: IRRIGATION AND DEBRIDEMENT LEFT KNEE, PLACE ANTIBIOTIC BEADS,  POLY EXCHANGE;  Surgeon: [name], MD;  Location: [facility];  Service: Orthopedics;  Laterality: Left;  . INGUINAL HERNIA REPAIR Bilateral 11/21/2018   Procedure: BILATERAL INGUINAL HERNIA REPAIR WITH MESH;  Surgeon: Coralie Keens, MD;  Location: [facility];  Service: General;  Laterality: Bilateral;  . IRRIGATION AND DEBRIDEMENT KNEE Left 01/12/2017   Procedure: IRRIGATION AND DEBRIDEMENT LEFT KNEE;  Surgeon: [name], MD;  Location: [facility];  Service: Orthopedics;  Laterality: Left;  . JOINT REPLACEMENT    . KNEE ARTHROSCOPY Left 08-25-2012  . LOWER EXTREMITY ANGIOGRAPHY Left 08/25/2016   Procedure: Lower Extremity Angiography;  Surgeon: Wellington Hampshire, MD;  Location: [facility]  CV LAB;  Service: Cardiovascular;  Laterality: Left;  . PERIPHERAL VASCULAR BALLOON ANGIOPLASTY Left 08/25/2016   Procedure: PERIPHERAL VASCULAR BALLOON ANGIOPLASTY;  Surgeon: Wellington Hampshire, MD;  Location: [facility];  Service: Cardiovascular;  Laterality: Left;  lt peroneal and ant tibial arteries cutting balloon  . REFRACTIVE SURGERY Bilateral   . STUMP REVISION Right 05/23/2019   Procedure: REVISION OF RIGHT BKA;  Surgeon: [name], MD;  Location: [facility];  Service: Orthopedics;  Laterality: Right;  . TOE AMPUTATION Bilateral    "I've lost 7 toes over the last 7 years" (05/09/2014)  . TOE SURGERY Left April 2015   Big toe removed on left foot.  . TONSILLECTOMY    . TOTAL KNEE ARTHROPLASTY Left 04/10/2014   Procedure:  TOTAL KNEE ARTHROPLASTY;  Surgeon: [name], MD;  Location: [facility];  Service: Orthopedics;  Laterality: Left;  . TOTAL KNEE REVISION Left 10/25/2014   Procedure: LEFT TOTAL KNEE REVISION;  Surgeon: [name], MD;  Location: [facility];  Service: Orthopedics;  Laterality: Left;  . TOTAL KNEE REVISION Left 11/26/2015   Procedure: Removal Left Total Knee Arthroplasty, Hinged Total Knee Arthroplasty;  Surgeon: [name], MD;  Location: [place] [place];  Service: Orthopedics;  Laterality: Left;  . UVULOPALATOPHARYNGOPLASTY, TONSILLECTOMY AND SEPTOPLASTY  ~ 1989  . WOUND DEBRIDEMENT Left 05/09/2014   Dehiscence Left Total Knee Arthroplasty Incision       Family History  Problem Relation Age of Onset  . Hypertension Mother   . Cancer Mother 53       Ovarian  . Heart disease Maternal Aunt   . Stroke Maternal Grandfather     Social History   Tobacco Use  . Smoking status: Former Smoker    Packs/day: 0.12    Years: 32.00    Pack years: 3.84    Types: Cigarettes    Quit date: 01/01/2019    Years since quitting: 0.4  . Smokeless tobacco: Never Used  . Tobacco comment: 1 per week  Substance Use Topics  . Alcohol use: No    Alcohol/week: 0.0 standard drinks    . Drug use: No    Home Medications Prior to Admission medications   Medication Sig Start Date End Date Taking? Authorizing Provider  acetaminophen (TYLENOL) 500 MG tablet Take 500-1,000 mg by mouth every 6 (six) hours as needed for mild pain or fever.     [provider]  Amino Acids-Protein Hydrolys (FEEDING SUPPLEMENT, PRO-STAT SUGAR FREE 64,) LIQD Take 30 mLs by mouth 2 (two) times daily. 01/05/19   Debbe Odea, MD  amLODipine (NORVASC) 5 MG tablet Take 5 mg by mouth every evening.     [provider]  aspirin EC 81 MG EC tablet Take 2 tablets (162 mg total) by mouth daily. Patient taking differently: Take 81 mg by mouth at bedtime.  01/20/17   Doreatha Lew, MD  bisacodyl (DULCOLAX) 10 MG suppository Place 1 suppository (10 mg total) rectally daily as needed for moderate constipation. Patient not taking: Reported on 05/21/2019 01/05/19   Debbe Odea, MD  carvedilol (COREG) 12.5 MG tablet Take 1 tablet (12.5 mg total) by mouth 2 (two) times daily with a meal. 02/08/17   Dana Allan I, MD  CREON [id number] units CPEP capsule Take 1-2 capsules by mouth See admin instructions. Take 2 capsules by mouth with meals and 1 capsule before snacks 03/20/19   [provider]  Darbepoetin Alfa (ARANESP) 150 MCG/0.3ML SOSY injection Inject 0.3 mLs (150 mcg total) into the vein every Friday with hemodialysis. 02/11/17   Bonnell Public, MD  docusate sodium (COLACE) 100 MG capsule Take 1 capsule (100 mg total) by mouth 2 (two) times daily. Patient not taking: Reported on 05/21/2019 01/05/19   Debbe Odea, MD  gabapentin (NEURONTIN) 100 MG capsule Take 200 mg by mouth daily.  09/27/18   [provider]  heparin 1000 UNIT/ML injection Inject 4 mLs into the vein See admin instructions. Every hemodialysis treatment 08/14/18 08/13/19  [provider]  hydrOXYzine (ATARAX/VISTARIL) 50 MG tablet Take 50 mg by mouth 3 (three) times daily as needed for itching.      [provider]  menthol-cetylpyridinium (CEPACOL) 3 MG lozenge Take 1 lozenge (3 mg total) by mouth as needed  for sore throat. Patient not taking: Reported on 05/21/2019 01/05/19   Debbe Odea, MD  methocarbamol (ROBAXIN) 500 MG tablet Take 1 tablet (500 mg total) by mouth every 6 (six) hours as needed for muscle spasms. 01/05/19   Debbe Odea, MD  montelukast (SINGULAIR) 10 MG tablet Take 10 mg by mouth at bedtime as needed (allergies).  08/14/18   [provider]  multivitamin (RENA-VIT) TABS tablet Take 1 tablet by mouth at bedtime. 02/08/17   Bonnell Public, MD  oxyCODONE (ROXICODONE) 5 MG immediate release tablet Take 1 tablet (5 mg total) by mouth every 8 (eight) hours as needed for up to 7 days. 05/29/19 06/05/19  Marianna Payment, MD  polyethylene glycol (MIRALAX / GLYCOLAX) 17 g packet Take 17 g by mouth daily as needed for mild constipation. Patient not taking: Reported on 05/21/2019 01/05/19   Debbe Odea, MD  sertraline (ZOLOFT) 100 MG tablet Take 100 mg by mouth daily. 04/18/19   [provider]  temazepam (RESTORIL) 30 MG capsule Take 30 mg by mouth at bedtime.     [provider]    Allergies    Morphine and related, Tygacil [tigecycline], and Imodium [loperamide]  Review of Systems   Review of Systems  Skin: Positive for wound.  All other systems reviewed and are negative.   Physical Exam Updated Vital Signs BP 121/69 (BP Location: Right Arm)   Pulse 62   Temp 98.1 F (36.7 C) (Axillary)   Resp 16   SpO2 94%   Physical Exam Vitals and nursing note reviewed.  Constitutional:      Appearance: He is well-developed.     Comments: Sleeping, awakes when spoken to loudly  HENT:     Head: Normocephalic and atraumatic.     Comments: Hematoma and abrasion noted to right forehead, no elicited tenderness with palpation Eyes:     Conjunctiva/sclera: Conjunctivae normal.     Pupils: Pupils are equal, round, and reactive to light.   Neck:     Comments: c-collar in place, ROM not tested Cardiovascular:     Rate and Rhythm: Normal rate and regular rhythm.     Heart sounds: Normal heart sounds.  Pulmonary:     Effort: Pulmonary effort is normal. No respiratory distress.     Breath sounds: Normal breath sounds. No rhonchi.  Abdominal:     General: Bowel sounds are normal.     Palpations: Abdomen is soft.     Tenderness: There is no abdominal tenderness. There is no rebound.  Musculoskeletal:        General: Normal range of motion.     Comments: Bilateral BKA's  Skin:    General: Skin is warm and dry.  Neurological:     Mental Status: He is alert.     Comments: Awake, alert, able to answer questions and follow commands, spontaneously moving arms without apparent difficulty, limited movement of the lower extremities due to bilateral BKA's, seemingly normal strength to baseline     ED Results / Procedures / Treatments   Labs (all labs ordered are listed, but only abnormal results are displayed) Labs Reviewed - No data to display  EKG None  Radiology CT Head Wo Contrast  Result Date: 06/01/2019 CLINICAL DATA:  Acute pain due to trauma. EXAM: CT HEAD WITHOUT CONTRAST CT CERVICAL SPINE WITHOUT CONTRAST TECHNIQUE: Multidetector CT imaging of the head and cervical spine was performed following the standard protocol without intravenous contrast. Multiplanar CT image reconstructions of the cervical spine were  also generated. COMPARISON:  May 26, 2019 FINDINGS: CT HEAD FINDINGS Brain: No evidence of acute infarction, hemorrhage, hydrocephalus, extra-axial collection or mass lesion/mass effect. There is left occipital lobe encephalomalacia. Vascular: No hyperdense vessel or unexpected calcification. Skull: There is frontal scalp swelling without evidence for an underlying fracture. Sinuses/Orbits: There is mucosal thickening of the left maxillary sinus. Other: None. CT CERVICAL SPINE FINDINGS Alignment: Normal. Skull base  and vertebrae: There is an acute mildly displaced fracture through the anterior base of the C3 vertebral body. The patient is status post prior ACDF from C4 through C7. Soft tissues and spinal canal: There is some prevertebral soft tissue swelling at the C3 level, although it is mild. Disc levels: Mild-to-moderate multilevel disc height loss is noted throughout the cervical spine. Upper chest: Negative. Other: None IMPRESSION: 1. No acute intracranial abnormality. 2. Acute mildly displaced fracture of the anterior inferior corner of the C3 vertebral body. 3. Frontal scalp swelling without evidence for an underlying fracture. Electronically Signed   By: Constance Holster M.D.   On: 06/01/2019 02:09   CT Cervical Spine Wo Contrast  Result Date: 06/01/2019 CLINICAL DATA:  Acute pain due to trauma. EXAM: CT HEAD WITHOUT CONTRAST CT CERVICAL SPINE WITHOUT CONTRAST TECHNIQUE: Multidetector CT imaging of the head and cervical spine was performed following the standard protocol without intravenous contrast. Multiplanar CT image reconstructions of the cervical spine were also generated. COMPARISON:  May 26, 2019 FINDINGS: CT HEAD FINDINGS Brain: No evidence of acute infarction, hemorrhage, hydrocephalus, extra-axial collection or mass lesion/mass effect. There is left occipital lobe encephalomalacia. Vascular: No hyperdense vessel or unexpected calcification. Skull: There is frontal scalp swelling without evidence for an underlying fracture. Sinuses/Orbits: There is mucosal thickening of the left maxillary sinus. Other: None. CT CERVICAL SPINE FINDINGS Alignment: Normal. Skull base and vertebrae: There is an acute mildly displaced fracture through the anterior base of the C3 vertebral body. The patient is status post prior ACDF from C4 through C7. Soft tissues and spinal canal: There is some prevertebral soft tissue swelling at the C3 level, although it is mild. Disc levels: Mild-to-moderate multilevel disc height loss  is noted throughout the cervical spine. Upper chest: Negative. Other: None IMPRESSION: 1. No acute intracranial abnormality. 2. Acute mildly displaced fracture of the anterior inferior corner of the C3 vertebral body. 3. Frontal scalp swelling without evidence for an underlying fracture. Electronically Signed   By: Constance Holster M.D.   On: 06/01/2019 02:09    Procedures Procedures (including critical care time)  Medications Ordered in ED Medications - No data to display  ED Course  I have reviewed the triage vital signs and the nursing notes.  Pertinent labs & imaging results that were available during my care of the patient were reviewed by me and considered in my medical decision making (see chart for details).    MDM Rules/Calculators/A&P  59 y.o. male presenting to the ED from SNF after a fall from his bed.  Staff reports he apparently rolled out of his bed during sleep.  He does have hematoma and contusion to right forehead but no open wound or laceration.  C-collar is in place.  Patient is sleeping but does arouse when spoken to loudly.  He is spontaneously moving his arms without apparent difficulty.  He has limited movement of the legs, does have bilateral BKA's.  CT of the head neck obtained, no acute intracranial findings, however does have mildly displaced fracture of anterior inferior corner of C3 vertebral body.  He does not have any findings of central cord syndrome on exam.  He will be transitioned to [place] collar and referred to neurosurgery for follow-up.  Report will be called to facility, instructed to remain in collar at all times except when bathing, arrange follow-up as soon as possible.  Return here for any new/acute changes.  Discussed with attending physician, Dr. Leonette Monarch-- we have reviewed and discussed CT findings.  He is in agreement with care plan.  Final Clinical Impression(s) / ED Diagnoses Final diagnoses:  Fall from bed, initial encounter  Other closed  displaced fracture of third cervical vertebra, initial encounter [facility])    Rx / DC Orders ED Discharge Orders    None       Larene Pickett, PA-C 06/01/19 0402    Fatima Blank, MD 06/01/19 [phone]

## 2019-06-01 NOTE — Discharge Instructions (Signed)
On imaging today, there was finding of a C3 vertebral body fracture.  He will need to stay in cervical collar at all times unless bathing until follow-up with neurosurgery.  Recommend to get this set up as soon as possible. Return here for any new or acute changes.

## 2019-06-01 NOTE — ED Notes (Signed)
Pt. For discharge, called report to facility, [facility] and spoke to Nurse S. Fatima Blank. [facility] called for transport.

## 2019-06-04 ENCOUNTER — Ambulatory Visit (INDEPENDENT_AMBULATORY_CARE_PROVIDER_SITE_OTHER): Payer: Medicare HMO | Admitting: Physician Assistant

## 2019-06-04 ENCOUNTER — Encounter: Payer: Self-pay | Admitting: Physician Assistant

## 2019-06-04 ENCOUNTER — Other Ambulatory Visit: Payer: Self-pay

## 2019-06-04 VITALS — Ht 70.0 in | Wt 119.0 lb

## 2019-06-04 DIAGNOSIS — S78111A Complete traumatic amputation at level between right hip and knee, initial encounter: Secondary | ICD-10-CM

## 2019-06-04 DIAGNOSIS — Z89611 Acquired absence of right leg above knee: Secondary | ICD-10-CM

## 2019-06-04 NOTE — Progress Notes (Signed)
Office Visit Note   Patient: Matthew Kline           Date of Birth: Sep 26, 1960           MRN: [id number] Visit Date: 06/04/2019              Requested by: [facility] [place] [address] [place]  [id number] PCP: Sheldon  Chief Complaint  Patient presents with  . Right Leg - Routine Post Op    05/25/19 right AKA       HPI: Patient is now a little over week status post right above-knee amputation.  He is overall doing well and at a nursing facility.  Unfortunately he did have a fall on the 21st and was seen in the emergency room and diagnosed with a cervical fracture at C3.  He is currently in a neck brace  Assessment & Plan: Visit Diagnoses: No diagnosis found.  Plan: Sent a prescription that the patient needs to follow-up with neurosurgery with regarding his cervical fracture.  Until then he should remain in his c-collar.  Follow-Up Instructions: No follow-ups on file.   Ortho Exam  Patient is alert, oriented, no adenopathy, well-dressed, normal affect, normal respiratory effort. Focused examination his right above-knee amputation stump is moderate swelling but no cellulitis or erythema incision is well approximated healthy wound edges minimal drainage no foul odor or cellulitis  Imaging: No results found. No images are attached to the encounter.  Labs: Lab Results  Component Value Date   HGBA1C 5.5 05/21/2019   HGBA1C 5.7 (H) 10/03/2018   HGBA1C 5.7 (H) 08/01/2018   ESRSEDRATE 18 (H) 05/22/2019   ESRSEDRATE 57 (H) 12/30/2018   ESRSEDRATE 80 (H) 11/29/2015   CRP 10.1 (H) 05/22/2019   CRP 7.6 (H) 12/30/2018   CRP 9.8 (H) 07/14/2016   REPTSTATUS 05/26/2019 FINAL 05/21/2019   GRAMSTAIN  05/21/2019    NO WBC SEEN RARE GRAM POSITIVE COCCI IN PAIRS Performed at [facility], [place] [address] [place], [place] [address]    CULT  05/21/2019    NO GROWTH 5 DAYS Performed at [facility] [place] [address] [place],  [place] [address]    LABORGA STAPHYLOCOCCUS AUREUS 05/21/2019     Lab Results  Component Value Date   ALBUMIN 1.8 (L) 05/30/2019   ALBUMIN 1.8 (L) 05/29/2019   ALBUMIN 1.7 (L) 05/27/2019   PREALBUMIN 13.3 (L) 12/30/2018    Lab Results  Component Value Date   MG 2.1 08/02/2018   MG 2.2 08/01/2018   MG 1.9 01/10/2012   No results found for: [facility]  Lab Results  Component Value Date   PREALBUMIN 13.3 (L) 12/30/2018   CBC EXTENDED Latest Ref Rng & Units 05/30/2019 05/29/2019 05/28/2019  WBC 4.0 - 10.5 K/uL 8.0 9.1 8.8  RBC 4.22 - 5.81 MIL/uL 2.40(L) 2.45(L) 2.47(L)  HGB 13.0 - 17.0 g/dL 7.3(L) 7.4(L) 7.4(L)  HCT 39.0 - 52.0 % 23.4(L) 23.5(L) 23.6(L)  PLT 150 - 400 K/uL 150 140(L) 133(L)  NEUTROABS 1.7 - 7.7 K/uL - - -  LYMPHSABS 0.7 - 4.0 K/uL - - -     Body mass index is 17.07 kg/m.  Orders:  No orders of the defined types were placed in this encounter.  No orders of the defined types were placed in this encounter.    Procedures: No procedures performed  Clinical Data: No additional findings.  ROS:  All other systems negative, except as noted in the HPI. Review  of Systems  Objective: Vital Signs: Ht 5\' 10"  (1.778 m)   Wt 119 lb (54 kg)   BMI 17.07 kg/m   Specialty Comments:  No specialty comments available.  PMFS History: Patient Active Problem List   Diagnosis Date Noted  . Cellulitis of right leg   . Dehiscence of amputation stump ([place])   . Abscess of right leg   . Cellulitis 05/21/2019  . Below-knee amputation of right lower extremity ([place])   . Abscess of tendon sheath, right ankle and foot   . Diabetic polyneuropathy associated with type 2 diabetes mellitus ([place])   . Severe protein-calorie malnutrition ([place])   . Cellulitis and abscess of leg 12/29/2018  . Cellulitis of scrotum 11/27/2018  . Sepsis ([place]) 11/24/2018  . Thrombocytopenia ([place]) 11/24/2018  . Bilateral inguinal hernia 11/21/2018  . Biliary colic 97/35/3299  . Acute cholecystitis  07/31/2018  . Prolonged QT interval 07/31/2018  . Acute respiratory failure with hypoxia ([place]) 07/10/2018  . Abdominal pain 07/10/2018  . Bilateral recurrent inguinal hernia without obstruction or gangrene   . Altered mental status   . Cerebral thrombosis with cerebral infarction 02/06/2017  . Cerebral embolism with cerebral infarction 02/06/2017  . Hx of AKA (above knee amputation), left ([place])   . Pressure injury of skin 01/30/2017  . Acute lower UTI 01/29/2017  . Acute metabolic encephalopathy 24/26/8341  . UTI (urinary tract infection) 01/29/2017  . Quadriceps muscle rupture, left, initial encounter   . Fall 01/09/2017  . Gait disturbance 01/09/2017  . Staphylococcus aureus infection 01/09/2017  . Infection of prosthetic left knee joint ([place]) 01/09/2017  . Fever, unknown origin 11/09/2016  . Critical lower limb ischemia 08/25/2016  . Ulcer of left midfoot with fat layer exposed ([place]) 08/13/2016  . Diabetic ulcer of left midfoot associated with type 2 diabetes mellitus, with fat layer exposed ([place]) 07/25/2016  . Peripheral neuropathy 07/22/2016  . Tobacco abuse 07/22/2016  . CAD in native artery 05/18/2016  . CAD, multiple vessel 05/11/2016  . Positive cardiac stress test 05/11/2016  . Abnormal stress test 04/30/2016  . Pre-transplant evaluation for kidney transplant 04/30/2016  . S/P revision of total knee 11/26/2015  . Pain in the chest   . Acute on chronic diastolic heart failure ([place]) 07/05/2015  . Volume overload 07/04/2015  . Shortness of breath 07/04/2015  . Hypoxemia 07/04/2015  . Elevated troponin   . ESRD on hemodialysis ([place])   . Hypervolemia   . 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 Pyogenic bacterial arthritis of knee, left ([place]) 08/07/2014  . Tachycardia 07/24/2014  . Acute upper respiratory infection 07/24/2014  . ESRD on dialysis ([place]) 07/14/2014  . Type II diabetes mellitus ([place]) 07/14/2014  . Anemia in chronic kidney disease 07/14/2014    . Congestive heart failure (CHF) ([place]) 07/13/2014  . Surgical wound dehiscence 05/09/2014  . Dehiscence of closure of skin 05/09/2014  . Total knee replacement status 04/10/2014  . Diabetes mellitus with renal manifestations, controlled ([place]) 10/24/2013  . Hypertensive renal disease 06/27/2013  . DM type 2 causing vascular disease ([place]) 06/27/2013  . Erectile dysfunction 06/27/2013  . Depression 06/27/2013  . PVD (peripheral vascular disease) ([place]) 12/19/2012  . Essential hypertension, benign 12/19/2012  . Sinusitis, acute maxillary 11/22/2012  . Otitis, externa, infective 11/14/2012  . Leg edema, left 11/14/2012  . End stage renal disease ([place]) 10/02/2012  . Controlled type 2 DM with proteinuria or microalbuminuria 09/19/2012  . GERD (gastroesophageal reflux disease) 09/19/2012  . Leukocytosis 09/19/2012  .  Lacunar infarction ([place]) 08/17/2012  . Polymyalgia rheumatica ([place]) 08/17/2012  . 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 Essential hypertension 05/10/2012  . Vitamin D deficiency 05/10/2012  . Diabetes mellitus due to underlying condition ([place]) 05/10/2012  . Hyperlipidemia LDL goal <100 05/10/2012  . Anemia of chronic disease 05/10/2012  . Screening for prostate cancer 05/10/2012  . Chronic kidney disease (CKD), stage IV (severe) ([place]) 05/10/2012  . Peripheral autonomic neuropathy due to DM ([place]) 05/10/2012  . Callus of foot 05/10/2012  . Urgency of urination 05/10/2012  . Hyperkalemia 05/10/2012  . Candidiasis of the esophagus 10/12/2011  . Internal hemorrhoids without mention of complication 51/88/4166  . Pre-syncope 07/25/2009  . DJD (degenerative joint disease) of cervical spine 02/17/2009   Past Medical History:  Diagnosis Date  . Anemia, unspecified   . Anxiety   . Arthralgia 2010   polyarticular  . Arthritis    "back, knees" (01/10/2017)  . Cancer [facility])    "kidney area" (01/10/2017)  . CHF (congestive heart failure) ([place]) 07/25/2009   denies  . Chronic lower  back pain   . Coronary artery disease   . Coughing    pt. reports that he has drainage from sinus infection  . Diabetic foot ulcer ([place])   . Diabetic neuropathy ([place])   . ESRD (end stage renal disease) on dialysis [facility])    started 12/2012; "MWF; [facility] "  (01/10/2017)  . GERD (gastroesophageal reflux disease)    hx "before I lost weight", no problem 9 years  . Hemodialysis access site with mature fistula ([place])   . Hemorrhoids, internal 10/2011   small  . High cholesterol   . History of blood transfusion    "related to the anemia"  . Hypertension   . Insomnia, unspecified   . Lacunar infarction ([place]) 2006   RUE/RLE, speech  . Long term (current) use of anticoagulants   . Myocardial infarction ([place]) 1995  . Orthostatic hypotension   . Osteomyelitis of foot, left, acute ([place])   . Other chronic postoperative pain   . Pneumonia    "probably twice" (01/10/2017)  . Polymyalgia rheumatica ([place])   . Renal insufficiency   . Sleep apnea    "lost weight; no more problem" (01/10/2017)  . Stroke ([place]) 01/10/06   denies residual on 05/09/2014  . Type II diabetes mellitus ([place]) dx'd 1995  . Unspecified hereditary and idiopathic peripheral neuropathy    feet  . Unspecified osteomyelitis, site unspecified   . Unspecified vitamin D deficiency     Family History  Problem Relation Age of Onset  . Hypertension Mother   . Cancer Mother 36       Ovarian  . Heart disease Maternal Aunt   . Stroke Maternal Grandfather     Past Surgical History:  Procedure Laterality Date  . ABDOMINAL AORTOGRAM N/A 08/25/2016   Procedure: ABDOMINAL AORTOGRAM;  Surgeon: Wellington Hampshire, MD;  Location: [facility] [place] [facility];  Service: Cardiovascular;  Laterality: N/A;  . AMPUTATION  01/21/2012   Procedure: AMPUTATION RAY;  Surgeon: [name], MD;  Location: [place];  Service: Orthopedics;  Laterality: Left;  Left Foot 4th Ray Amputation  . AMPUTATION Left 05/04/2013   Procedure: AMPUTATION DIGIT;   Surgeon: [name], MD;  Location: [facility];  Service: Orthopedics;  Laterality: Left;  Left Great Toe Amputation at MTP  . AMPUTATION Left 01/14/2017   Procedure: AMPUTATION ABOVE LEFT KNEE;  Surgeon: [name], MD;  Location: [facility];  Service: Orthopedics;  Laterality: Left;  . AMPUTATION Right 12/31/2018   Procedure: AMPUTATION BELOW KNEE;  Surgeon: [name], MD;  Location: [facility];  Service: Orthopedics;  Laterality: Right;  . AMPUTATION Right 05/25/2019   Procedure: RIGHT ABOVE KNEE AMPUTATION;  Surgeon: [name], MD;  Location: [facility] [place];  Service: Orthopedics;  Laterality: Right;  . ANTERIOR CERVICAL DECOMP/DISCECTOMY FUSION  02/2011  . APPLICATION OF WOUND VAC Right 12/31/2018   Procedure: Application Of Wound Vac;  Surgeon: [name], MD;  Location: [facility];  Service: Orthopedics;  Laterality: Right;  . BACK SURGERY    . BASCILIC VEIN TRANSPOSITION Left 10/19/2012   Procedure: BASCILIC VEIN TRANSPOSITION;  Surgeon: Serafina Mitchell, MD;  Location: [facility];  Service: Vascular;  Laterality: Left;  . CARDIAC CATHETERIZATION     "before bypass"  . CHOLECYSTECTOMY N/A 08/02/2018   Procedure: LAPAROSCOPIC CHOLECYSTECTOMY WITH INTRAOPERATIVE CHOLANGIOGRAM;  Surgeon: Donnie Mesa, MD;  Location: [place];  Service: General;  Laterality: N/A;  . CORONARY ARTERY BYPASS GRAFT     x 5 with lima at [place] SPACERS Left 08/07/2014   Procedure: Replace Left Total Knee Arthroplasty,  Place Antibiotic Spacer;  Surgeon: [name], MD;  Location: [facility];  Service: Orthopedics;  Laterality: Left;  . I & D EXTREMITY Left 05/09/2014   Procedure: Irrigation and Debridement Left Knee and Closure of Total Knee Arthroplasty Incision;  Surgeon: [name], MD;  Location: [facility];  Service: Orthopedics;  Laterality: Left;  . I & D KNEE WITH POLY EXCHANGE Left 05/31/2014   Procedure: IRRIGATION AND DEBRIDEMENT LEFT KNEE, PLACE ANTIBIOTIC BEADS,   POLY EXCHANGE;  Surgeon: [name], MD;  Location: [place];  Service: Orthopedics;  Laterality: Left;  . INGUINAL HERNIA REPAIR Bilateral 11/21/2018   Procedure: BILATERAL INGUINAL HERNIA REPAIR WITH MESH;  Surgeon: Coralie Keens, MD;  Location: [facility];  Service: General;  Laterality: Bilateral;  . IRRIGATION AND DEBRIDEMENT KNEE Left 01/12/2017   Procedure: IRRIGATION AND DEBRIDEMENT LEFT KNEE;  Surgeon: [name], MD;  Location: [facility];  Service: Orthopedics;  Laterality: Left;  . JOINT REPLACEMENT    . KNEE ARTHROSCOPY Left 08-25-2012  . LOWER EXTREMITY ANGIOGRAPHY Left 08/25/2016   Procedure: Lower Extremity Angiography;  Surgeon: Wellington Hampshire, MD;  Location: [facility];  Service: Cardiovascular;  Laterality: Left;  . PERIPHERAL VASCULAR BALLOON ANGIOPLASTY Left 08/25/2016   Procedure: PERIPHERAL VASCULAR BALLOON ANGIOPLASTY;  Surgeon: Wellington Hampshire, MD;  Location: [facility];  Service: Cardiovascular;  Laterality: Left;  lt peroneal and ant tibial arteries cutting balloon  . REFRACTIVE SURGERY Bilateral   . STUMP REVISION Right 05/23/2019   Procedure: REVISION OF RIGHT BKA;  Surgeon: [name], MD;  Location: [facility];  Service: Orthopedics;  Laterality: Right;  . TOE AMPUTATION Bilateral    "I've lost 7 toes over the last 7 years" (05/09/2014)  . TOE SURGERY Left April 2015   Big toe removed on left foot.  . TONSILLECTOMY    . TOTAL KNEE ARTHROPLASTY Left 04/10/2014   Procedure: TOTAL KNEE ARTHROPLASTY;  Surgeon: [name], MD;  Location: [facility];  Service: Orthopedics;  Laterality: Left;  . TOTAL KNEE REVISION Left 10/25/2014   Procedure: LEFT TOTAL KNEE REVISION;  Surgeon: [name], MD;  Location: [facility];  Service: Orthopedics;  Laterality: Left;  . TOTAL KNEE REVISION Left 11/26/2015   Procedure: Removal  Left Total Knee Arthroplasty, Hinged Total Knee Arthroplasty;  Surgeon: [name], MD;  Location: [facility];  Service: Orthopedics;  Laterality:  Left;  . UVULOPALATOPHARYNGOPLASTY, TONSILLECTOMY AND SEPTOPLASTY  ~ 1989  . WOUND DEBRIDEMENT Left 05/09/2014   Dehiscence Left Total Knee Arthroplasty Incision   Social History   Occupational History  . Not on file  Tobacco Use  . Smoking status: Former Smoker    Packs/day: 0.12    Years: 32.00    Pack years: 3.84    Types: Cigarettes    Quit date: 01/01/2019    Years since quitting: 0.4  . Smokeless tobacco: Never Used  . Tobacco comment: 1 per week  Substance and Sexual Activity  . Alcohol use: No    Alcohol/week: 0.0 standard drinks  . Drug use: No  . Sexual activity: Not Currently

## 2019-06-12 ENCOUNTER — Encounter: Payer: Self-pay | Admitting: Family

## 2019-06-12 ENCOUNTER — Other Ambulatory Visit: Payer: Self-pay

## 2019-06-12 ENCOUNTER — Ambulatory Visit (INDEPENDENT_AMBULATORY_CARE_PROVIDER_SITE_OTHER): Payer: Medicare HMO | Admitting: Orthopedic Surgery

## 2019-06-12 ENCOUNTER — Ambulatory Visit: Payer: Medicare HMO | Admitting: Cardiovascular Disease

## 2019-06-12 VITALS — Ht 70.0 in | Wt 119.0 lb

## 2019-06-12 DIAGNOSIS — S78112A Complete traumatic amputation at level between left hip and knee, initial encounter: Secondary | ICD-10-CM

## 2019-06-19 ENCOUNTER — Other Ambulatory Visit: Payer: Self-pay

## 2019-06-19 ENCOUNTER — Ambulatory Visit (INDEPENDENT_AMBULATORY_CARE_PROVIDER_SITE_OTHER): Payer: Medicare HMO | Admitting: Cardiovascular Disease

## 2019-06-19 ENCOUNTER — Encounter: Payer: Self-pay | Admitting: Cardiovascular Disease

## 2019-06-19 VITALS — BP 164/84 | HR 60 | Temp 97.5°F | Resp 20 | Ht 70.0 in

## 2019-06-19 DIAGNOSIS — E785 Hyperlipidemia, unspecified: Secondary | ICD-10-CM

## 2019-06-19 DIAGNOSIS — I5022 Chronic systolic (congestive) heart failure: Secondary | ICD-10-CM | POA: Diagnosis not present

## 2019-06-19 DIAGNOSIS — I739 Peripheral vascular disease, unspecified: Secondary | ICD-10-CM | POA: Diagnosis not present

## 2019-06-19 DIAGNOSIS — I2510 Atherosclerotic heart disease of native coronary artery without angina pectoris: Secondary | ICD-10-CM | POA: Diagnosis not present

## 2019-06-19 MED ORDER — ATORVASTATIN CALCIUM 40 MG PO TABS: 40.0000 mg | ORAL_TABLET | Freq: Every day | ORAL | 3 refills | Status: AC

## 2019-06-19 NOTE — Progress Notes (Signed)
Cardiology Office Note   Date:  06/19/2019   ID:  Matthew Kline, DOB 27-Oct-1960, MRN [id number]  PCP:  [facility]  Cardiologist:  Fletcher Anon   No chief complaint on file.     History of Present Illness: Matthew Kline is a 59 y.o. male who is here today for a follow-up visit regarding peripheral arterial disease and CAD.  I saw him most recently in 2018 and he lost to follow-up since then but recently he reestablished.  He has known history of coronary artery disease status post CABG in May 2018, type 2 diabetes, end-stage renal disease on hemodialysis, essential hypertension, CVA, previous tobacco use and peripheral neuropathy. The patient had morbid obesity in the past but lost significant amount of weight with improving lifestyle. He is on dialysis Monday Wednesday and Friday .\ The patient was seen by me in 2018 for nonhealing ulceration of the left foot with osteomyelitis.  I did perform revascularization of the anterior tibial and TP trunk but it appears that he continued to have recurrent osteomyelitis and ultimately underwent bilateral above-the-knee amputation. He had an echocardiogram done in July of last year which showed an EF of 25 to 30%.  Subsequently, he underwent a Lexiscan Myoview in October which showed an EF of 30%.  It was indeterminate study due to lack of heart rate blood pressure change to Lexiscan but overall there was no significant perfusion defect.  The patient is currently at inpatient rehab and he is planning to move to live with his son after discharge.  Obviously, he is wheelchair-bound given bilateral AKA.  He denies chest pain or shortness of breath.  No leg pain.  He reports that his blood pressure dropped during dialysis.  Past Medical History:  Diagnosis Date   Anemia, unspecified    Anxiety    Arthralgia 2010   polyarticular   Arthritis    "back, knees" (01/10/2017)   Cancer ([place])    "kidney area" (01/10/2017)   CHF  (congestive heart failure) ([place]) 07/25/2009   denies   Chronic lower back pain    Coronary artery disease    Coughing    pt. 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   RUE/RLE, speech   Long term (current) use of anticoagulants    Myocardial infarction ([place]) 1995   Orthostatic hypotension    Osteomyelitis of foot, left, acute ([place])    Other chronic postoperative pain    Pneumonia    "probably twice" (01/10/2017)   Polymyalgia rheumatica ([place])    Renal insufficiency    Sleep apnea    "lost weight; no more problem" (01/10/2017)   Stroke ([place]) 01/10/06   denies residual on 05/09/2014   Type II diabetes mellitus ([place]) dx'd 1995   Unspecified hereditary and idiopathic peripheral neuropathy    feet   Unspecified osteomyelitis, site unspecified    Unspecified vitamin D  deficiency     Past Surgical History:  Procedure Laterality Date   ABDOMINAL AORTOGRAM N/A 08/25/2016   Procedure: ABDOMINAL AORTOGRAM;  Surgeon: Wellington Hampshire, MD;  Location: [facility];  Service: Cardiovascular;  Laterality: N/A;   AMPUTATION  01/21/2012   Procedure: AMPUTATION RAY;  Surgeon: [name], MD;  Location: [facility];  Service: Orthopedics;  Laterality: Left;  Left Foot 4th Ray Amputation   AMPUTATION Left 05/04/2013   Procedure: AMPUTATION DIGIT;  Surgeon: [name], MD;  Location: [facility];  Service: Orthopedics;  Laterality: Left;  Left Great Toe  Amputation at MTP   AMPUTATION Left 01/14/2017   Procedure: AMPUTATION ABOVE LEFT KNEE;  Surgeon: [name], MD;  Location: [facility];  Service: Orthopedics;  Laterality: Left;   AMPUTATION Right 12/31/2018   Procedure: AMPUTATION BELOW KNEE;  Surgeon: [name], MD;  Location: [facility];  Service: Orthopedics;  Laterality: Right;   AMPUTATION Right 05/25/2019   Procedure: RIGHT ABOVE KNEE AMPUTATION;  Surgeon: [name], MD;  Location: [facility];  Service: Orthopedics;  Laterality: Right;   ANTERIOR CERVICAL DECOMP/DISCECTOMY FUSION  10/1749   APPLICATION OF WOUND VAC Right 12/31/2018   Procedure: Application Of Wound Vac;  Surgeon: [name], MD;  Location: [facility];  Service: Orthopedics;  Laterality: Right;   BACK SURGERY     BASCILIC VEIN TRANSPOSITION Left 10/19/2012   Procedure: BASCILIC VEIN TRANSPOSITION;  Surgeon: Serafina Mitchell, MD;  Location: [facility];  Service: Vascular;  Laterality: Left;   CARDIAC CATHETERIZATION     "before bypass"   CHOLECYSTECTOMY N/A 08/02/2018   Procedure: LAPAROSCOPIC CHOLECYSTECTOMY WITH INTRAOPERATIVE CHOLANGIOGRAM;  Surgeon: Donnie Mesa, MD;  Location: [facility];  Service: General;  Laterality: N/A;   CORONARY ARTERY BYPASS GRAFT     x 5 with lima at [place] Left 08/07/2014   Procedure: Replace Left Total Knee Arthroplasty,  Place Antibiotic Spacer;  Surgeon: [name], MD;  Location: [facility];  Service: Orthopedics;  Laterality: Left;   I & D EXTREMITY Left 05/09/2014   Procedure: Irrigation and Debridement Left Knee and Closure of Total Knee Arthroplasty Incision;  Surgeon: [name], MD;  Location: [facility];  Service: Orthopedics;  Laterality: Left;   I & D KNEE WITH POLY EXCHANGE Left 05/31/2014   Procedure: IRRIGATION AND DEBRIDEMENT LEFT KNEE, PLACE ANTIBIOTIC BEADS,  POLY EXCHANGE;  Surgeon: [name], MD;  Location: [facility];  Service: Orthopedics;  Laterality: Left;    INGUINAL HERNIA REPAIR Bilateral 11/21/2018   Procedure: BILATERAL INGUINAL HERNIA REPAIR WITH MESH;  Surgeon: Coralie Keens, MD;  Location: [facility];  Service: General;  Laterality: Bilateral;   IRRIGATION AND DEBRIDEMENT KNEE Left 01/12/2017   Procedure: IRRIGATION AND DEBRIDEMENT LEFT KNEE;  Surgeon: [name], MD;  Location: [facility];  Service: Orthopedics;  Laterality: Left;   JOINT REPLACEMENT     KNEE ARTHROSCOPY Left 08-25-2012   LOWER EXTREMITY ANGIOGRAPHY Left 08/25/2016   Procedure: Lower Extremity Angiography;  Surgeon: Wellington Hampshire, MD;  Location: [facility];  Service: Cardiovascular;  Laterality: Left;   PERIPHERAL VASCULAR BALLOON ANGIOPLASTY Left 08/25/2016   Procedure: PERIPHERAL VASCULAR BALLOON ANGIOPLASTY;  Surgeon: Wellington Hampshire, MD;  Location: [facility];  Service: Cardiovascular;  Laterality: Left;  lt peroneal and ant tibial arteries cutting balloon   REFRACTIVE SURGERY Bilateral    STUMP REVISION Right 05/23/2019  Procedure: REVISION OF RIGHT BKA;  Surgeon: [name], MD;  Location: [facility];  Service: Orthopedics;  Laterality: Right;   TOE AMPUTATION Bilateral    "I've lost 7 toes over the last 7 years" (05/09/2014)   TOE SURGERY Left April 2015   Big toe removed on left foot.   TONSILLECTOMY     TOTAL KNEE ARTHROPLASTY Left 04/10/2014   Procedure: TOTAL KNEE ARTHROPLASTY;  Surgeon: [name], MD;  Location: [facility];  Service: Orthopedics;  Laterality: Left;   TOTAL KNEE REVISION Left 10/25/2014   Procedure: LEFT TOTAL KNEE REVISION;  Surgeon: [name], MD;  Location: [facility];  Service: Orthopedics;  Laterality: Left;   TOTAL KNEE REVISION Left 11/26/2015   Procedure: Removal Left Total Knee Arthroplasty, Hinged Total Knee Arthroplasty;  Surgeon: [name], MD;  Location: [facility];  Service: Orthopedics;  Laterality: Left;   UVULOPALATOPHARYNGOPLASTY, TONSILLECTOMY AND SEPTOPLASTY  ~ [place] Left  05/09/2014   Dehiscence Left Total Knee Arthroplasty Incision     Current Outpatient Medications  Medication Sig Dispense Refill   acetaminophen (TYLENOL) 500 MG tablet Take 500-1,000 mg by mouth every 6 (six) hours as needed for mild pain or fever.      Amino Acids-Protein Hydrolys (FEEDING SUPPLEMENT, PRO-STAT SUGAR FREE 64,) LIQD Take 30 mLs by mouth 2 (two) times daily. 887 mL 0   amLODipine (NORVASC) 5 MG tablet Take 5 mg by mouth every evening.      aspirin EC 81 MG EC tablet Take 2 tablets (162 mg total) by mouth daily. (Patient taking differently: Take 81 mg by mouth at bedtime. )     bisacodyl (DULCOLAX) 10 MG suppository Place 1 suppository (10 mg total) rectally daily as needed for moderate constipation. 12 suppository 0   carvedilol (COREG) 12.5 MG tablet Take 1 tablet (12.5 mg total) by mouth 2 (two) times daily with a meal. 60 tablet 0   CREON 36000-114000 units CPEP capsule Take 1-2 capsules by mouth See admin instructions. Take 2 capsules by mouth with meals and 1 capsule before snacks     Darbepoetin Alfa (ARANESP) 150 MCG/0.3ML SOSY injection Inject 0.3 mLs (150 mcg total) into the vein every Friday with hemodialysis. 1.68 mL 0   docusate sodium (COLACE) 100 MG capsule Take 1 capsule (100 mg total) by mouth 2 (two) times daily. 10 capsule 0   gabapentin (NEURONTIN) 100 MG capsule Take 200 mg by mouth daily.      heparin 1000 UNIT/ML injection Inject 4 mLs into the vein See admin instructions. Every hemodialysis treatment     hydrOXYzine (ATARAX/VISTARIL) 50 MG tablet Take 50 mg by mouth 3 (three) times daily as needed for itching.      menthol-cetylpyridinium (CEPACOL) 3 MG lozenge Take 1 lozenge (3 mg total) by mouth as needed for sore throat. 100 tablet 12   montelukast (SINGULAIR) 10 MG tablet Take 10 mg by mouth at bedtime as needed (allergies).      multivitamin (RENA-VIT) TABS tablet Take 1 tablet by mouth at bedtime. 30 tablet 0   sertraline (ZOLOFT) 100  MG tablet Take 100 mg by mouth daily.     temazepam (RESTORIL) 30 MG capsule Take 30 mg by mouth at bedtime.      atorvastatin (LIPITOR) 40 MG tablet Take 1 tablet (40 mg total) by mouth daily. 90 tablet 3   No current facility-administered medications for this visit.    Allergies:   Morphine and related, Tygacil [  tigecycline], and Imodium [loperamide]    Social History:  The patient  reports that he quit smoking about 5 months ago. His smoking use included cigarettes. He has a 3.84 pack-year smoking history. He has never used smokeless tobacco. He reports that he does not drink alcohol or use drugs.   Family History:  The patient's family history includes Cancer (age of onset: 88) in his mother; Heart disease in his maternal aunt; Hypertension in his mother; Stroke in his maternal grandfather.    ROS:  Please see the history of present illness.   Otherwise, review of systems are positive for none.   All other systems are reviewed and negative.    PHYSICAL EXAM: VS:  BP (!) 164/84    Pulse 60    Temp (!) 97.5 F (36.4 C)    Resp 20    Ht 5\' 10"  (1.778 m) Comment: wheelchair   SpO2 96%    BMI 17.07 kg/m  , BMI Body mass index is 17.07 kg/m. GEN: Well nourished, well developed, in no acute distress  HEENT: normal  Neck: no JVD, carotid bruits, or masses Cardiac: RRR; no murmurs, rubs, or gallops,no edema  Respiratory:  clear to auscultation bilaterally, normal work of breathing GI: soft, nontender, nondistended, + BS MS: no deformity or atrophy  Skin: warm and dry, no rash Neuro:  Strength and sensation are intact Psych: euthymic mood, full affect  EKG:  EKG is not ordered today.   Recent Labs: 08/01/2018: TSH 3.359 08/02/2018: Magnesium 2.1 05/29/2019: ALT 7 05/30/2019: BUN 40; Creatinine, Ser 3.71; Hemoglobin 7.3; Platelets 150; Potassium 4.0; Sodium 134    Lipid Panel    Component Value Date/Time   CHOL 97 02/07/2017 0612   CHOL 228 (H) 09/15/2012 1030   TRIG 77  02/07/2017 0612   HDL 39 (L) 02/07/2017 0612   HDL 40 09/15/2012 1030   CHOLHDL 2.5 02/07/2017 0612   VLDL 15 02/07/2017 0612   LDLCALC 43 02/07/2017 0612   LDLCALC 113 (H) 09/15/2012 1030      Wt Readings from Last 3 Encounters:  06/12/19 119 lb (54 kg)  06/04/19 119 lb (54 kg)  05/30/19 119 lb 14.9 oz (54.4 kg)       PAD Screen 08/17/2016  Previous PAD dx? No  Previous surgical procedure? Yes  Pain with walking? Yes  Subsides with rest? Yes  Feet/toe relief with dangling? No  Painful, non-healing ulcers? Yes  Extremities discolored? No      ASSESSMENT AND PLAN:  1.  Peripheral arterial disease: The patient is status post bilateral above-the-knee amputation due to recurrent osteomyelitis.  He is wheelchair-bound.  No need for further testing of peripheral arterial disease.    2. Coronary artery disease involving native coronary arteries without angina: No significant perfusion defects noted on Lexiscan Myoview in October of last year in spite of significant drop in ejection fraction.  3.  Chronic systolic heart failure: Ejection fraction of 30%.  Continue treatment with carvedilol.  He is on amlodipine.  Ideally, we should consider Entresto.  However, he reports significant drop in blood pressure during dialysis.  4.. Hyperlipidemia: He used to be on atorvastatin but I do not see that on his list.  Given his PAD and CAD, there is a strong indication for treatment and I resumed atorvastatin 40 mg daily.  5. Essential hypertension: Blood pressure is elevated today but with drops during dialysis and thus I made no changes.     Disposition:   FU with  me in 4 month  Signed,  Kathlyn Sacramento, MD  06/19/2019 9:14 AM    [place]

## 2019-06-19 NOTE — Patient Instructions (Signed)
Medication Instructions:  Start Atorvastatin 40 mg daily   *If you need a refill on your cardiac medications before your next appointment, please call your pharmacy*   Follow-Up: At [facility], you and your health needs are our priority.  As part of our continuing mission to provide you with exceptional heart care, we have created designated Provider Care Teams.  These Care Teams include your primary Cardiologist (physician) and Advanced Practice Providers (APPs -  Physician Assistants and Nurse Practitioners) who all work together to provide you with the care you need, when you need it.  We recommend signing up for the patient portal called "MyChart".  Sign up information is provided on this After Visit Summary.  MyChart is used to connect with patients for Virtual Visits (Telemedicine).  Patients are able to view lab/test results, encounter notes, upcoming appointments, etc.  Non-urgent messages can be sent to your provider as well.   To learn more about what you can do with MyChart, go to [email or web address].    Your next appointment:   4 month(s)  The format for your next appointment:   In Person  Provider:   Kathlyn Sacramento, MD

## 2019-06-21 ENCOUNTER — Encounter: Payer: Self-pay | Admitting: Family

## 2019-06-21 NOTE — Progress Notes (Signed)
Office Visit Note   Patient: Matthew Kline           Date of Birth: 09-19-1960           MRN: [id number] Visit Date: 06/12/2019              Requested by: [facility] [address] [place],  [place] [id number] PCP: [place]  Chief Complaint  Patient presents with  . Right Leg - Routine Post Op    05/25/19 right AKA       HPI: Patient is a 59 year old gentleman who presents 2 weeks status post right above-the-knee amputation patient denies any drainage he is in dialysis he is currently wearing a neck brace from a recent fall.  Assessment & Plan: Visit Diagnoses: No diagnosis found.  Plan: Patient will follow up with [facility] for transfer legs.  Follow-Up Instructions: Return in about 2 weeks (around 06/26/2019).   Ortho Exam  Patient is alert, oriented, no adenopathy, well-dressed, normal affect, normal respiratory effort. Examination the incision as well-healed, patient has neck pain but no radicular symptoms.  There is no redness no cellulitis no signs of infection.  Imaging: No results found. No images are attached to the encounter.  Labs: Lab Results  Component Value Date   HGBA1C 5.5 05/21/2019   HGBA1C 5.7 (H) 10/03/2018   HGBA1C 5.7 (H) 08/01/2018   ESRSEDRATE 18 (H) 05/22/2019   ESRSEDRATE 57 (H) 12/30/2018   ESRSEDRATE 80 (H) 11/29/2015   CRP 10.1 (H) 05/22/2019   CRP 7.6 (H) 12/30/2018   CRP 9.8 (H) 07/14/2016   REPTSTATUS 05/26/2019 FINAL 05/21/2019   GRAMSTAIN  05/21/2019    NO WBC SEEN RARE GRAM POSITIVE COCCI IN PAIRS Performed at [facility], [place] [address], [place], [place] [address]    CULT  05/21/2019    NO GROWTH 5 DAYS Performed at [facility] [address], [place], [place] [address]    LABORGA STAPHYLOCOCCUS AUREUS 05/21/2019     Lab Results  Component Value Date   ALBUMIN 1.8 (L) 05/30/2019   ALBUMIN 1.8 (L) 05/29/2019   ALBUMIN 1.7 (L) 05/27/2019   PREALBUMIN 13.3 (L) 12/30/2018      Lab Results  Component Value Date   MG 2.1 08/02/2018   MG 2.2 08/01/2018   MG 1.9 01/10/2012   No results found for: [facility]  Lab Results  Component Value Date   PREALBUMIN 13.3 (L) 12/30/2018   CBC EXTENDED Latest Ref Rng & Units 05/30/2019 05/29/2019 05/28/2019  WBC 4.0 - 10.5 K/uL 8.0 9.1 8.8  RBC 4.22 - 5.81 MIL/uL 2.40(L) 2.45(L) 2.47(L)  HGB 13.0 - 17.0 g/dL 7.3(L) 7.4(L) 7.4(L)  HCT 39 - 52 % 23.4(L) 23.5(L) 23.6(L)  PLT 150 - 400 K/uL 150 140(L) 133(L)  NEUTROABS 1.7 - 7.7 K/uL - - -  LYMPHSABS 0.7 - 4.0 K/uL - - -     Body mass index is 17.07 kg/m.  Orders:  No orders of the defined types were placed in this encounter.  No orders of the defined types were placed in this encounter.    Procedures: No procedures performed  Clinical Data: No additional findings.  ROS:  All other systems negative, except as noted in the HPI. Review of Systems  Objective: Vital Signs: Ht 5\' 10"  (1.778 m)   Wt 119 lb (54 kg)   BMI 17.07 kg/m   Specialty Comments:  No specialty comments available.  PMFS History: Patient Active Problem List  Diagnosis Date Noted  . Cellulitis of right leg   . Dehiscence of amputation stump ([place])   . Abscess of right leg   . Cellulitis 05/21/2019  . Below-knee amputation of right lower extremity ([place])   . Abscess of tendon sheath, right ankle and foot   . Diabetic polyneuropathy associated with type 2 diabetes mellitus ([place])   . Severe protein-calorie malnutrition ([place])   . Cellulitis and abscess of leg 12/29/2018  . Cellulitis of scrotum 11/27/2018  . Sepsis ([place]) 11/24/2018  . Thrombocytopenia ([place]) 11/24/2018  . Bilateral inguinal hernia 11/21/2018  . Biliary colic 83/41/9622  . Acute cholecystitis 07/31/2018  . Prolonged QT interval 07/31/2018  . Acute respiratory failure with hypoxia ([place]) 07/10/2018  . Abdominal pain 07/10/2018  . Bilateral recurrent inguinal hernia without obstruction or gangrene   . Altered mental  status   . Cerebral thrombosis with cerebral infarction 02/06/2017  . Cerebral embolism with cerebral infarction 02/06/2017  . Hx of AKA (above knee amputation), left ([place])   . Pressure injury of skin 01/30/2017  . Acute lower UTI 01/29/2017  . Acute metabolic encephalopathy 29/79/8921  . UTI (urinary tract infection) 01/29/2017  . Quadriceps muscle rupture, left, initial encounter   . Fall 01/09/2017  . Gait disturbance 01/09/2017  . Staphylococcus aureus infection 01/09/2017  . Infection of prosthetic left knee joint ([place]) 01/09/2017  . Fever, unknown origin 11/09/2016  . Critical lower limb ischemia 08/25/2016  . Ulcer of left midfoot with fat layer exposed ([place]) 08/13/2016  . Diabetic ulcer of left midfoot associated with type 2 diabetes mellitus, with fat layer exposed ([place]) 07/25/2016  . Peripheral neuropathy 07/22/2016  . Tobacco abuse 07/22/2016  . CAD in native artery 05/18/2016  . CAD, multiple vessel 05/11/2016  . Positive cardiac stress test 05/11/2016  . Abnormal stress test 04/30/2016  . Pre-transplant evaluation for kidney transplant 04/30/2016  . S/P revision of total knee 11/26/2015  . Pain in the chest   . Acute on chronic diastolic heart failure ([place]) 07/05/2015  . Volume overload 07/04/2015  . Shortness of breath 07/04/2015  . Hypoxemia 07/04/2015  . Elevated troponin   . ESRD on hemodialysis ([place])   . Hypervolemia   . 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 Pyogenic bacterial arthritis of knee, left ([place]) 08/07/2014  . Tachycardia 07/24/2014  . Acute upper respiratory infection 07/24/2014  . ESRD on dialysis ([place]) 07/14/2014  . Type II diabetes mellitus ([place]) 07/14/2014  . Anemia in chronic kidney disease 07/14/2014  . Congestive heart failure (CHF) ([place]) 07/13/2014  . Surgical wound dehiscence 05/09/2014  . Dehiscence of closure of skin 05/09/2014  . Total knee replacement status 04/10/2014  . Diabetes mellitus with renal manifestations,  controlled ([place]) 10/24/2013  . Hypertensive renal disease 06/27/2013  . DM type 2 causing vascular disease ([place]) 06/27/2013  . Erectile dysfunction 06/27/2013  . Depression 06/27/2013  . PVD (peripheral vascular disease) ([place]) 12/19/2012  . Essential hypertension, benign 12/19/2012  . Sinusitis, acute maxillary 11/22/2012  . Otitis, externa, infective 11/14/2012  . Leg edema, left 11/14/2012  . End stage renal disease ([place]) 10/02/2012  . Controlled type 2 DM with proteinuria or microalbuminuria 09/19/2012  . GERD (gastroesophageal reflux disease) 09/19/2012  . Leukocytosis 09/19/2012  . Lacunar infarction ([place]) 08/17/2012  . Polymyalgia rheumatica ([place]) 08/17/2012  . 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 Essential hypertension 05/10/2012  . Vitamin D deficiency 05/10/2012  . Diabetes mellitus due to underlying condition ([place]) 05/10/2012  . Hyperlipidemia LDL  goal <100 05/10/2012  . Anemia of chronic disease 05/10/2012  . Screening for prostate cancer 05/10/2012  . Chronic kidney disease (CKD), stage IV (severe) ([place]) 05/10/2012  . Peripheral autonomic neuropathy due to DM ([place]) 05/10/2012  . Callus of foot 05/10/2012  . Urgency of urination 05/10/2012  . Hyperkalemia 05/10/2012  . Candidiasis of the esophagus 10/12/2011  . Internal hemorrhoids without mention of complication 16/10/9602  . Pre-syncope 07/25/2009  . DJD (degenerative joint disease) of cervical spine 02/17/2009   Past Medical History:  Diagnosis Date  . Anemia, unspecified   . Anxiety   . Arthralgia 2010   polyarticular  . Arthritis    "back, knees" (01/10/2017)  . Cancer [facility])    "kidney area" (01/10/2017)  . CHF (congestive heart failure) ([place]) 07/25/2009   denies  . Chronic lower back pain   . Coronary artery disease   . Coughing    pt. reports that he has drainage from sinus infection  . Diabetic foot ulcer ([place])   . Diabetic neuropathy ([place])   . ESRD (end stage renal disease) on dialysis [facility])     started 12/2012; "MWF; [facility] "  (01/10/2017)  . GERD (gastroesophageal reflux disease)    hx "before I lost weight", no problem 9 years  . Hemodialysis access site with mature fistula ([place])   . Hemorrhoids, internal 10/2011   small  . High cholesterol   . History of blood transfusion    "related to the anemia"  . Hypertension   . Insomnia, unspecified   . Lacunar infarction ([place]) 2006   RUE/RLE, speech  . Long term (current) use of anticoagulants   . Myocardial infarction ([place]) 1995  . Orthostatic hypotension   . Osteomyelitis of foot, left, acute ([place])   . Other chronic postoperative pain   . Pneumonia    "probably twice" (01/10/2017)  . Polymyalgia rheumatica ([place])   . Renal insufficiency   . Sleep apnea    "lost weight; no more problem" (01/10/2017)  . Stroke ([place]) 01/10/06   denies residual on 05/09/2014  . Type II diabetes mellitus ([place]) dx'd 1995  . Unspecified hereditary and idiopathic peripheral neuropathy    feet  . Unspecified osteomyelitis, site unspecified   . Unspecified vitamin D deficiency     Family History  Problem Relation Age of Onset  . Hypertension Mother   . Cancer Mother 64       Ovarian  . Heart disease Maternal Aunt   . Stroke Maternal Grandfather     Past Surgical History:  Procedure Laterality Date  . ABDOMINAL AORTOGRAM N/A 08/25/2016   Procedure: ABDOMINAL AORTOGRAM;  Surgeon: Wellington Hampshire, MD;  Location: [facility];  Service: Cardiovascular;  Laterality: N/A;  . AMPUTATION  01/21/2012   Procedure: AMPUTATION RAY;  Surgeon: [name], MD;  Location: [facility];  Service: Orthopedics;  Laterality: Left;  Left Foot 4th Ray Amputation  . AMPUTATION Left 05/04/2013   Procedure: AMPUTATION DIGIT;  Surgeon: [name], MD;  Location: [facility];  Service: Orthopedics;  Laterality: Left;  Left Great Toe Amputation at MTP  . AMPUTATION Left 01/14/2017   Procedure: AMPUTATION ABOVE LEFT KNEE;  Surgeon: [name], MD;  Location:  [facility];  Service: Orthopedics;  Laterality: Left;  . AMPUTATION Right 12/31/2018   Procedure: AMPUTATION BELOW KNEE;  Surgeon: [name], MD;  Location: [facility];  Service: Orthopedics;  Laterality: Right;  . AMPUTATION Right 05/25/2019   Procedure: RIGHT  ABOVE KNEE AMPUTATION;  Surgeon: [name], MD;  Location: [facility];  Service: Orthopedics;  Laterality: Right;  . ANTERIOR CERVICAL DECOMP/DISCECTOMY FUSION  02/2011  . APPLICATION OF WOUND VAC Right 12/31/2018   Procedure: Application Of Wound Vac;  Surgeon: [name], MD;  Location: [facility];  Service: Orthopedics;  Laterality: Right;  . BACK SURGERY    . BASCILIC VEIN TRANSPOSITION Left 10/19/2012   Procedure: BASCILIC VEIN TRANSPOSITION;  Surgeon: Serafina Mitchell, MD;  Location: [facility];  Service: Vascular;  Laterality: Left;  . CARDIAC CATHETERIZATION     "before bypass"  . CHOLECYSTECTOMY N/A 08/02/2018   Procedure: LAPAROSCOPIC CHOLECYSTECTOMY WITH INTRAOPERATIVE CHOLANGIOGRAM;  Surgeon: Donnie Mesa, MD;  Location: [facility];  Service: General;  Laterality: N/A;  . CORONARY ARTERY BYPASS GRAFT     x 5 with lima at [place] SPACERS Left 08/07/2014   Procedure: Replace Left Total Knee Arthroplasty,  Place Antibiotic Spacer;  Surgeon: [name], MD;  Location: [facility];  Service: Orthopedics;  Laterality: Left;  . I & D EXTREMITY Left 05/09/2014   Procedure: Irrigation and Debridement Left Knee and Closure of Total Knee Arthroplasty Incision;  Surgeon: [name], MD;  Location: [facility];  Service: Orthopedics;  Laterality: Left;  . I & D KNEE WITH POLY EXCHANGE Left 05/31/2014   Procedure: IRRIGATION AND DEBRIDEMENT LEFT KNEE, PLACE ANTIBIOTIC BEADS,  POLY EXCHANGE;  Surgeon: [name], MD;  Location: [facility];  Service: Orthopedics;  Laterality: Left;  . INGUINAL HERNIA REPAIR Bilateral 11/21/2018   Procedure: BILATERAL INGUINAL HERNIA REPAIR WITH MESH;  Surgeon: Coralie Keens, MD;  Location: [facility];  Service: General;  Laterality: Bilateral;  . IRRIGATION AND DEBRIDEMENT KNEE Left 01/12/2017   Procedure: IRRIGATION AND DEBRIDEMENT LEFT KNEE;  Surgeon: [name], MD;  Location: [facility];  Service: Orthopedics;  Laterality: Left;  . JOINT REPLACEMENT    . KNEE ARTHROSCOPY Left 08-25-2012  . LOWER EXTREMITY ANGIOGRAPHY Left 08/25/2016   Procedure: Lower Extremity Angiography;  Surgeon: Wellington Hampshire, MD;  Location: [facility];  Service: Cardiovascular;  Laterality: Left;  . PERIPHERAL VASCULAR BALLOON ANGIOPLASTY Left 08/25/2016   Procedure: PERIPHERAL VASCULAR BALLOON ANGIOPLASTY;  Surgeon: Wellington Hampshire, MD;  Location: [facility];  Service: Cardiovascular;  Laterality: Left;  lt peroneal and ant tibial arteries cutting balloon  . REFRACTIVE SURGERY Bilateral   . STUMP REVISION Right 05/23/2019   Procedure: REVISION OF RIGHT BKA;  Surgeon: [name], MD;  Location: [facility];  Service: Orthopedics;  Laterality: Right;  . TOE AMPUTATION Bilateral    "I've lost 7 toes over the last 7 years" (05/09/2014)  . TOE SURGERY Left April 2015   Big toe removed on left foot.  . TONSILLECTOMY    . TOTAL KNEE ARTHROPLASTY Left 04/10/2014   Procedure: TOTAL KNEE ARTHROPLASTY;  Surgeon: [name], MD;  Location: [facility];  Service: Orthopedics;  Laterality: Left;  . TOTAL KNEE REVISION Left 10/25/2014   Procedure: LEFT TOTAL KNEE REVISION;  Surgeon: [name], MD;  Location: [facility];  Service: Orthopedics;  Laterality: Left;  . TOTAL KNEE REVISION Left 11/26/2015   Procedure: Removal Left Total Knee Arthroplasty, Hinged Total Knee Arthroplasty;  Surgeon: [name], MD;  Location: [facility];  Service: Orthopedics;  Laterality: Left;  . UVULOPALATOPHARYNGOPLASTY, TONSILLECTOMY AND SEPTOPLASTY  ~ 1989  . WOUND DEBRIDEMENT Left 05/09/2014  Dehiscence Left Total Knee Arthroplasty Incision   Social History   Occupational History  . Not on file    Tobacco Use  . Smoking status: Former Smoker    Packs/day: 0.12    Years: 32.00    Pack years: 3.84    Types: Cigarettes    Quit date: 01/01/2019    Years since quitting: 0.4  . Smokeless tobacco: Never Used  . Tobacco comment: 1 per week  Vaping Use  . Vaping Use: Never used  Substance and Sexual Activity  . Alcohol use: No    Alcohol/week: 0.0 standard drinks  . Drug use: No  . Sexual activity: Not Currently

## 2019-06-25 ENCOUNTER — Inpatient Hospital Stay (HOSPITAL_COMMUNITY)
Admit: 2019-06-25 | Discharge: 2019-07-12 | DRG: 070 | Disposition: E | Payer: Medicare HMO | Attending: Internal Medicine | Admitting: Internal Medicine

## 2019-06-25 ENCOUNTER — Encounter (HOSPITAL_COMMUNITY): Payer: Self-pay | Admitting: Emergency Medicine

## 2019-06-25 ENCOUNTER — Emergency Department (HOSPITAL_COMMUNITY): Payer: Medicare HMO

## 2019-06-25 DIAGNOSIS — N2581 Secondary hyperparathyroidism of renal origin: Secondary | ICD-10-CM | POA: Diagnosis present

## 2019-06-25 DIAGNOSIS — M353 Polymyalgia rheumatica: Secondary | ICD-10-CM | POA: Diagnosis present

## 2019-06-25 DIAGNOSIS — G9340 Encephalopathy, unspecified: Secondary | ICD-10-CM | POA: Diagnosis present

## 2019-06-25 DIAGNOSIS — N19 Unspecified kidney failure: Secondary | ICD-10-CM

## 2019-06-25 DIAGNOSIS — G9341 Metabolic encephalopathy: Principal | ICD-10-CM | POA: Diagnosis present

## 2019-06-25 DIAGNOSIS — I469 Cardiac arrest, cause unspecified: Secondary | ICD-10-CM | POA: Diagnosis not present

## 2019-06-25 DIAGNOSIS — G4700 Insomnia, unspecified: Secondary | ICD-10-CM | POA: Diagnosis present

## 2019-06-25 DIAGNOSIS — I2510 Atherosclerotic heart disease of native coronary artery without angina pectoris: Secondary | ICD-10-CM | POA: Diagnosis present

## 2019-06-25 DIAGNOSIS — Z992 Dependence on renal dialysis: Secondary | ICD-10-CM

## 2019-06-25 DIAGNOSIS — D631 Anemia in chronic kidney disease: Secondary | ICD-10-CM | POA: Diagnosis present

## 2019-06-25 DIAGNOSIS — Z8673 Personal history of transient ischemic attack (TIA), and cerebral infarction without residual deficits: Secondary | ICD-10-CM

## 2019-06-25 DIAGNOSIS — Z96652 Presence of left artificial knee joint: Secondary | ICD-10-CM | POA: Diagnosis present

## 2019-06-25 DIAGNOSIS — E1122 Type 2 diabetes mellitus with diabetic chronic kidney disease: Secondary | ICD-10-CM | POA: Diagnosis present

## 2019-06-25 DIAGNOSIS — I10 Essential (primary) hypertension: Secondary | ICD-10-CM | POA: Diagnosis not present

## 2019-06-25 DIAGNOSIS — G8928 Other chronic postprocedural pain: Secondary | ICD-10-CM | POA: Diagnosis present

## 2019-06-25 DIAGNOSIS — L89321 Pressure ulcer of left buttock, stage 1: Secondary | ICD-10-CM | POA: Diagnosis present

## 2019-06-25 DIAGNOSIS — I132 Hypertensive heart and chronic kidney disease with heart failure and with stage 5 chronic kidney disease, or end stage renal disease: Secondary | ICD-10-CM | POA: Diagnosis present

## 2019-06-25 DIAGNOSIS — G4730 Sleep apnea, unspecified: Secondary | ICD-10-CM | POA: Diagnosis present

## 2019-06-25 DIAGNOSIS — E559 Vitamin D deficiency, unspecified: Secondary | ICD-10-CM | POA: Diagnosis present

## 2019-06-25 DIAGNOSIS — E162 Hypoglycemia, unspecified: Secondary | ICD-10-CM

## 2019-06-25 DIAGNOSIS — E1140 Type 2 diabetes mellitus with diabetic neuropathy, unspecified: Secondary | ICD-10-CM | POA: Diagnosis present

## 2019-06-25 DIAGNOSIS — I5042 Chronic combined systolic (congestive) and diastolic (congestive) heart failure: Secondary | ICD-10-CM | POA: Diagnosis present

## 2019-06-25 DIAGNOSIS — M545 Low back pain: Secondary | ICD-10-CM | POA: Diagnosis present

## 2019-06-25 DIAGNOSIS — K219 Gastro-esophageal reflux disease without esophagitis: Secondary | ICD-10-CM | POA: Diagnosis present

## 2019-06-25 DIAGNOSIS — Z87891 Personal history of nicotine dependence: Secondary | ICD-10-CM

## 2019-06-25 DIAGNOSIS — F419 Anxiety disorder, unspecified: Secondary | ICD-10-CM | POA: Diagnosis present

## 2019-06-25 DIAGNOSIS — Z89612 Acquired absence of left leg above knee: Secondary | ICD-10-CM

## 2019-06-25 DIAGNOSIS — D696 Thrombocytopenia, unspecified: Secondary | ICD-10-CM | POA: Diagnosis present

## 2019-06-25 DIAGNOSIS — K8689 Other specified diseases of pancreas: Secondary | ICD-10-CM | POA: Diagnosis present

## 2019-06-25 DIAGNOSIS — Z89611 Acquired absence of right leg above knee: Secondary | ICD-10-CM

## 2019-06-25 DIAGNOSIS — Z20822 Contact with and (suspected) exposure to covid-19: Secondary | ICD-10-CM | POA: Diagnosis present

## 2019-06-25 DIAGNOSIS — M1990 Unspecified osteoarthritis, unspecified site: Secondary | ICD-10-CM | POA: Diagnosis present

## 2019-06-25 DIAGNOSIS — Y92230 Patient room in hospital as the place of occurrence of the external cause: Secondary | ICD-10-CM | POA: Diagnosis not present

## 2019-06-25 DIAGNOSIS — Z79899 Other long term (current) drug therapy: Secondary | ICD-10-CM

## 2019-06-25 DIAGNOSIS — Z885 Allergy status to narcotic agent status: Secondary | ICD-10-CM

## 2019-06-25 DIAGNOSIS — R4182 Altered mental status, unspecified: Secondary | ICD-10-CM

## 2019-06-25 DIAGNOSIS — N186 End stage renal disease: Secondary | ICD-10-CM | POA: Diagnosis present

## 2019-06-25 DIAGNOSIS — E7800 Pure hypercholesterolemia, unspecified: Secondary | ICD-10-CM | POA: Diagnosis present

## 2019-06-25 DIAGNOSIS — Z8249 Family history of ischemic heart disease and other diseases of the circulatory system: Secondary | ICD-10-CM

## 2019-06-25 DIAGNOSIS — E8889 Other specified metabolic disorders: Secondary | ICD-10-CM | POA: Diagnosis present

## 2019-06-25 DIAGNOSIS — Z681 Body mass index (BMI) 19 or less, adult: Secondary | ICD-10-CM

## 2019-06-25 DIAGNOSIS — G253 Myoclonus: Secondary | ICD-10-CM | POA: Diagnosis present

## 2019-06-25 DIAGNOSIS — E785 Hyperlipidemia, unspecified: Secondary | ICD-10-CM | POA: Diagnosis present

## 2019-06-25 DIAGNOSIS — I252 Old myocardial infarction: Secondary | ICD-10-CM

## 2019-06-25 DIAGNOSIS — W06XXXA Fall from bed, initial encounter: Secondary | ICD-10-CM | POA: Diagnosis not present

## 2019-06-25 DIAGNOSIS — Z7982 Long term (current) use of aspirin: Secondary | ICD-10-CM

## 2019-06-25 DIAGNOSIS — Z981 Arthrodesis status: Secondary | ICD-10-CM

## 2019-06-25 DIAGNOSIS — E119 Type 2 diabetes mellitus without complications: Secondary | ICD-10-CM

## 2019-06-25 DIAGNOSIS — Z66 Do not resuscitate: Secondary | ICD-10-CM | POA: Diagnosis present

## 2019-06-25 DIAGNOSIS — F329 Major depressive disorder, single episode, unspecified: Secondary | ICD-10-CM | POA: Diagnosis present

## 2019-06-25 DIAGNOSIS — E860 Dehydration: Secondary | ICD-10-CM | POA: Diagnosis present

## 2019-06-25 DIAGNOSIS — R197 Diarrhea, unspecified: Secondary | ICD-10-CM | POA: Diagnosis present

## 2019-06-25 DIAGNOSIS — Z89511 Acquired absence of right leg below knee: Secondary | ICD-10-CM

## 2019-06-25 DIAGNOSIS — E11649 Type 2 diabetes mellitus with hypoglycemia without coma: Secondary | ICD-10-CM | POA: Diagnosis present

## 2019-06-25 DIAGNOSIS — Z888 Allergy status to other drugs, medicaments and biological substances status: Secondary | ICD-10-CM

## 2019-06-25 DIAGNOSIS — L89311 Pressure ulcer of right buttock, stage 1: Secondary | ICD-10-CM | POA: Diagnosis present

## 2019-06-25 DIAGNOSIS — Z951 Presence of aortocoronary bypass graft: Secondary | ICD-10-CM

## 2019-06-25 DIAGNOSIS — L89152 Pressure ulcer of sacral region, stage 2: Secondary | ICD-10-CM | POA: Diagnosis present

## 2019-06-25 DIAGNOSIS — Z9049 Acquired absence of other specified parts of digestive tract: Secondary | ICD-10-CM

## 2019-06-25 LAB — CBC
HCT: 28.3 % — ABNORMAL LOW (ref 39.0–52.0)
Hemoglobin: 8.8 g/dL — ABNORMAL LOW (ref 13.0–17.0)
MCH: 31.3 pg (ref 26.0–34.0)
MCHC: 31.1 g/dL (ref 30.0–36.0)
MCV: 100.7 fL — ABNORMAL HIGH (ref 80.0–100.0)
Platelets: 83 10*3/uL — ABNORMAL LOW (ref 150–400)
RBC: 2.81 MIL/uL — ABNORMAL LOW (ref 4.22–5.81)
RDW: 17 % — ABNORMAL HIGH (ref 11.5–15.5)
WBC: 6.5 10*3/uL (ref 4.0–10.5)
nRBC: 0 % (ref 0.0–0.2)

## 2019-06-25 LAB — COMPREHENSIVE METABOLIC PANEL
ALT: 8 U/L (ref 0–44)
AST: 17 U/L (ref 15–41)
Albumin: 2.3 g/dL — ABNORMAL LOW (ref 3.5–5.0)
Alkaline Phosphatase: 106 U/L (ref 38–126)
Anion gap: 12 (ref 5–15)
BUN: 65 mg/dL — ABNORMAL HIGH (ref 6–20)
CO2: 23 mmol/L (ref 22–32)
Calcium: 8.8 mg/dL — ABNORMAL LOW (ref 8.9–10.3)
Chloride: 102 mmol/L (ref 98–111)
Creatinine, Ser: 8.29 mg/dL — ABNORMAL HIGH (ref 0.61–1.24)
GFR calc Af Amer: 7 mL/min — ABNORMAL LOW (ref >60)
GFR calc non Af Amer: 6 mL/min — ABNORMAL LOW (ref >60)
Glucose, Bld: 105 mg/dL — ABNORMAL HIGH (ref 70–99)
Potassium: 4.7 mmol/L (ref 3.5–5.1)
Sodium: 137 mmol/L (ref 135–145)
Total Bilirubin: 1.1 mg/dL (ref 0.3–1.2)
Total Protein: 6.9 g/dL (ref 6.5–8.1)

## 2019-06-25 LAB — SARS CORONAVIRUS 2 BY RT PCR (HOSPITAL ORDER, PERFORMED IN ~~LOC~~ HOSPITAL LAB): SARS Coronavirus 2: NEGATIVE

## 2019-06-25 MED ORDER — TEMAZEPAM 30 MG PO CAPS: 30.0000 mg | ORAL_CAPSULE | Freq: Every day | ORAL | Status: DC

## 2019-06-25 MED ORDER — PANCRELIPASE (LIP-PROT-AMYL) 36000-114000 UNITS PO CPEP
72000.0000 [IU] | ORAL_CAPSULE | Freq: Three times a day (TID) | ORAL | Status: DC
  Administered 2019-06-26 – 2019-06-27 (×2): 72000 [IU] via ORAL
  Filled 2019-06-25 (×2): qty 2

## 2019-06-25 MED ORDER — MENTHOL 3 MG MT LOZG: 1.0000 | LOZENGE | OROMUCOSAL | Status: DC | PRN

## 2019-06-25 MED ORDER — ASPIRIN EC 81 MG PO TBEC
81.0000 mg | DELAYED_RELEASE_TABLET | Freq: Every day | ORAL | Status: DC
  Administered 2019-06-27: 81 mg via ORAL
  Filled 2019-06-25 (×2): qty 1

## 2019-06-25 MED ORDER — HEPARIN SODIUM (PORCINE) 5000 UNIT/ML IJ SOLN
5000.0000 [IU] | Freq: Three times a day (TID) | INTRAMUSCULAR | Status: DC
  Administered 2019-06-26 – 2019-06-27 (×2): 5000 [IU] via SUBCUTANEOUS
  Filled 2019-06-25 (×4): qty 1

## 2019-06-25 MED ORDER — ATORVASTATIN CALCIUM 40 MG PO TABS
40.0000 mg | ORAL_TABLET | Freq: Every day | ORAL | Status: DC
  Administered 2019-06-26 – 2019-06-27 (×2): 40 mg via ORAL
  Filled 2019-06-25 (×2): qty 1

## 2019-06-25 MED ORDER — PANCRELIPASE (LIP-PROT-AMYL) 36000-114000 UNITS PO CPEP: 36000.0000 [IU] | ORAL_CAPSULE | ORAL | Status: DC

## 2019-06-25 MED ORDER — SERTRALINE HCL 100 MG PO TABS
100.0000 mg | ORAL_TABLET | Freq: Every day | ORAL | Status: DC
  Administered 2019-06-26: 100 mg via ORAL
  Filled 2019-06-25: qty 1

## 2019-06-25 MED ORDER — AMLODIPINE BESYLATE 5 MG PO TABS
5.0000 mg | ORAL_TABLET | Freq: Every evening | ORAL | Status: DC
  Administered 2019-06-26 – 2019-06-27 (×2): 5 mg via ORAL
  Filled 2019-06-25 (×2): qty 1

## 2019-06-25 MED ORDER — PRO-STAT SUGAR FREE PO LIQD
30.0000 mL | Freq: Two times a day (BID) | ORAL | Status: DC
  Administered 2019-06-27 (×2): 30 mL via ORAL
  Filled 2019-06-25 (×4): qty 30

## 2019-06-25 MED ORDER — SODIUM CHLORIDE 0.9% FLUSH: 3.0000 mL | Freq: Once | INTRAVENOUS | Status: DC

## 2019-06-25 NOTE — ED Provider Notes (Signed)
[facility] EMERGENCY DEPARTMENT Provider Note   CSN: [id number] Arrival date & time: 06/22/2019  1521     History Chief Complaint  Patient presents with  . Altered Mental Status    Matthew Kline is a 59 y.o. male.  Patient is a 59 year old male with a complex medical history including ESRD on dialysis presenting with son from home after recent discharge from skilled nursing facility with altered mental status.  Patient is somnolent but arousable.  The history is provided by the patient and a relative. The history is limited by the condition of the patient.  Illness Location:  Renal, neurologic Quality:  Uremia, altered mental status Severity:  Moderate Onset quality:  Gradual Timing:  Constant Progression:  Worsening Chronicity:  Chronic Context:  Patient ESRD on dialysis recent fall and was in a nursing facility just discharged per patient's son Relieved by:  Nothing Worsened by:  None tried Ineffective treatments:  Nothing Associated symptoms: diarrhea   Associated symptoms: no abdominal pain, no chest pain, no headaches, no loss of consciousness, no nausea, no shortness of breath and no vomiting        Past Medical History:  Diagnosis Date  . Anemia, unspecified   . Anxiety   . Arthralgia 2010   polyarticular  . Arthritis    "back, knees" (01/10/2017)  . Cancer [facility])    "kidney area" (01/10/2017)  . CHF (congestive heart failure) ([place]) 07/25/2009   denies  . Chronic lower back pain   . Coronary artery disease   . Coughing    pt. reports that he has drainage from sinus infection  . Diabetic foot ulcer ([place])   . Diabetic neuropathy ([place])   . ESRD (end stage renal disease) on dialysis [facility] [address])    started 12/2012; "MWF; [facility] "  (01/10/2017)  . GERD (gastroesophageal reflux disease)    hx "before I lost weight", no problem 9 years  . Hemodialysis access site with mature fistula ([place])   . Hemorrhoids, internal 10/2011   small  .  High cholesterol   . History of blood transfusion    "related to the anemia"  . Hypertension   . Insomnia, unspecified   . Lacunar infarction ([place]) 2006   RUE/RLE, speech  . Long term (current) use of anticoagulants   . Myocardial infarction ([place]) 1995  . Orthostatic hypotension   . Osteomyelitis of foot, left, acute ([place])   . Other chronic postoperative pain   . Pneumonia    "probably twice" (01/10/2017)  . Polymyalgia rheumatica ([place])   . Renal insufficiency   . Sleep apnea    "lost weight; no more problem" (01/10/2017)  . Stroke ([place]) 01/10/06   denies residual on 05/09/2014  . Type II diabetes mellitus ([place]) dx'd 1995  . Unspecified hereditary and idiopathic peripheral neuropathy    feet  . Unspecified osteomyelitis, site unspecified   . Unspecified vitamin D deficiency     Patient Active Problem List   Diagnosis Date Noted  . Acute encephalopathy 06/30/2019  . Cellulitis of right leg   . Dehiscence of amputation stump ([place])   . Abscess of right leg   . Cellulitis 05/21/2019  . Below-knee amputation of right lower extremity ([place])   . Abscess of tendon sheath, right ankle and foot   . Diabetic polyneuropathy associated with type 2 diabetes mellitus ([place])   . Severe protein-calorie malnutrition ([place])   . Cellulitis and abscess of leg 12/29/2018  . Cellulitis of scrotum 11/27/2018  .  Sepsis ([place]) 11/24/2018  . Thrombocytopenia ([place]) 11/24/2018  . Bilateral inguinal hernia 11/21/2018  . Biliary colic 95/63/8756  . Acute cholecystitis 07/31/2018  . Prolonged QT interval 07/31/2018  . Acute respiratory failure with hypoxia ([place]) 07/10/2018  . Abdominal pain 07/10/2018  . Bilateral recurrent inguinal hernia without obstruction or gangrene   . Altered mental status   . Cerebral thrombosis with cerebral infarction 02/06/2017  . Cerebral embolism with cerebral infarction 02/06/2017  . Hx of AKA (above knee amputation), left ([place])   . Pressure injury of skin 01/30/2017    . Acute lower UTI 01/29/2017  . Acute metabolic encephalopathy 43/32/9518  . UTI (urinary tract infection) 01/29/2017  . Quadriceps muscle rupture, left, initial encounter   . Fall 01/09/2017  . Gait disturbance 01/09/2017  . Staphylococcus aureus infection 01/09/2017  . Infection of prosthetic left knee joint ([place]) 01/09/2017  . Fever, unknown origin 11/09/2016  . Critical lower limb ischemia 08/25/2016  . Ulcer of left midfoot with fat layer exposed ([place]) 08/13/2016  . Diabetic ulcer of left midfoot associated with type 2 diabetes mellitus, with fat layer exposed ([place]) 07/25/2016  . Peripheral neuropathy 07/22/2016  . Tobacco abuse 07/22/2016  . CAD in native artery 05/18/2016  . CAD, multiple vessel 05/11/2016  . Positive cardiac stress test 05/11/2016  . Abnormal stress test 04/30/2016  . Pre-transplant evaluation for kidney transplant 04/30/2016  . S/P revision of total knee 11/26/2015  . Pain in the chest   . Acute on chronic diastolic heart failure ([place]) 07/05/2015  . Volume overload 07/04/2015  . Shortness of breath 07/04/2015  . Hypoxemia 07/04/2015  . Elevated troponin   . ESRD on hemodialysis ([place])   . Hypervolemia   . 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 Pyogenic bacterial arthritis of knee, left ([place]) 08/07/2014  . Tachycardia 07/24/2014  . Acute upper respiratory infection 07/24/2014  . ESRD on dialysis ([place]) 07/14/2014  . Type II diabetes mellitus ([place]) 07/14/2014  . Anemia in chronic kidney disease 07/14/2014  . Congestive heart failure (CHF) ([place]) 07/13/2014  . Surgical wound dehiscence 05/09/2014  . Dehiscence of closure of skin 05/09/2014  . Total knee replacement status 04/10/2014  . Diabetes mellitus with renal manifestations, controlled ([place]) 10/24/2013  . Hypertensive renal disease 06/27/2013  . DM type 2 causing vascular disease ([place]) 06/27/2013  . Erectile dysfunction 06/27/2013  . Depression 06/27/2013  . PVD (peripheral  vascular disease) ([place]) 12/19/2012  . Essential hypertension, benign 12/19/2012  . Sinusitis, acute maxillary 11/22/2012  . Otitis, externa, infective 11/14/2012  . Leg edema, left 11/14/2012  . End stage renal disease ([place]) 10/02/2012  . Controlled type 2 DM with proteinuria or microalbuminuria 09/19/2012  . GERD (gastroesophageal reflux disease) 09/19/2012  . Leukocytosis 09/19/2012  . Lacunar infarction ([place]) 08/17/2012  . Polymyalgia rheumatica ([place]) 08/17/2012  . 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 Essential hypertension 05/10/2012  . Vitamin D deficiency 05/10/2012  . Diabetes mellitus due to underlying condition ([place]) 05/10/2012  . Hyperlipidemia LDL goal <100 05/10/2012  . Anemia of chronic disease 05/10/2012  . Screening for prostate cancer 05/10/2012  . Chronic kidney disease (CKD), stage IV (severe) ([place]) 05/10/2012  . Peripheral autonomic neuropathy due to DM ([place]) 05/10/2012  . Callus of foot 05/10/2012  . Urgency of urination 05/10/2012  . Hyperkalemia 05/10/2012  . Candidiasis of the esophagus 10/12/2011  . Internal hemorrhoids without mention of complication 84/16/6063  . Pre-syncope 07/25/2009  . DJD (degenerative joint disease) of cervical spine 02/17/2009  Past Surgical History:  Procedure Laterality Date  . ABDOMINAL AORTOGRAM N/A 08/25/2016   Procedure: ABDOMINAL AORTOGRAM;  Surgeon: Wellington Hampshire, MD;  Location: [facility];  Service: Cardiovascular;  Laterality: N/A;  . AMPUTATION  01/21/2012   Procedure: AMPUTATION RAY;  Surgeon: [name], MD;  Location: [facility];  Service: Orthopedics;  Laterality: Left;  Left Foot 4th Ray Amputation  . AMPUTATION Left 05/04/2013   Procedure: AMPUTATION DIGIT;  Surgeon: [name], MD;  Location: [facility];  Service: Orthopedics;  Laterality: Left;  Left Great Toe Amputation at MTP  . AMPUTATION Left 01/14/2017   Procedure: AMPUTATION ABOVE LEFT KNEE;  Surgeon: [name], MD;  Location: [facility];  Service:  Orthopedics;  Laterality: Left;  . AMPUTATION Right 12/31/2018   Procedure: AMPUTATION BELOW KNEE;  Surgeon: [name], MD;  Location: [facility];  Service: Orthopedics;  Laterality: Right;  . AMPUTATION Right 05/25/2019   Procedure: RIGHT ABOVE KNEE AMPUTATION;  Surgeon: [name], MD;  Location: [facility];  Service: Orthopedics;  Laterality: Right;  . ANTERIOR CERVICAL DECOMP/DISCECTOMY FUSION  02/2011  . APPLICATION OF WOUND VAC Right 12/31/2018   Procedure: Application Of Wound Vac;  Surgeon: [name], MD;  Location: [facility];  Service: Orthopedics;  Laterality: Right;  . BACK SURGERY    . BASCILIC VEIN TRANSPOSITION Left 10/19/2012   Procedure: BASCILIC VEIN TRANSPOSITION;  Surgeon: Serafina Mitchell, MD;  Location: [facility];  Service: Vascular;  Laterality: Left;  . CARDIAC CATHETERIZATION     "before bypass"  . CHOLECYSTECTOMY N/A 08/02/2018   Procedure: LAPAROSCOPIC CHOLECYSTECTOMY WITH INTRAOPERATIVE CHOLANGIOGRAM;  Surgeon: Donnie Mesa, MD;  Location: [facility];  Service: General;  Laterality: N/A;  . CORONARY ARTERY BYPASS GRAFT     x 5 with lima at [place] SPACERS Left 08/07/2014   Procedure: Replace Left Total Knee Arthroplasty,  Place Antibiotic Spacer;  Surgeon: [name], MD;  Location: [facility];  Service: Orthopedics;  Laterality: Left;  . I & D EXTREMITY Left 05/09/2014   Procedure: Irrigation and Debridement Left Knee and Closure of Total Knee Arthroplasty Incision;  Surgeon: [name], MD;  Location: [facility];  Service: Orthopedics;  Laterality: Left;  . I & D KNEE WITH POLY EXCHANGE Left 05/31/2014   Procedure: IRRIGATION AND DEBRIDEMENT LEFT KNEE, PLACE ANTIBIOTIC BEADS,  POLY EXCHANGE;  Surgeon: [name], MD;  Location: [facility];  Service: Orthopedics;  Laterality: Left;  . INGUINAL HERNIA REPAIR Bilateral 11/21/2018   Procedure: BILATERAL INGUINAL HERNIA REPAIR WITH MESH;  Surgeon: Coralie Keens, MD;  Location:  [facility];  Service: General;  Laterality: Bilateral;  . IRRIGATION AND DEBRIDEMENT KNEE Left 01/12/2017   Procedure: IRRIGATION AND DEBRIDEMENT LEFT KNEE;  Surgeon: [name], MD;  Location: [facility];  Service: Orthopedics;  Laterality: Left;  . JOINT REPLACEMENT    . KNEE ARTHROSCOPY Left 08-25-2012  . LOWER EXTREMITY ANGIOGRAPHY Left 08/25/2016   Procedure: Lower Extremity Angiography;  Surgeon: Wellington Hampshire, MD;  Location: [facility];  Service: Cardiovascular;  Laterality: Left;  . PERIPHERAL VASCULAR BALLOON ANGIOPLASTY Left 08/25/2016   Procedure: PERIPHERAL VASCULAR BALLOON ANGIOPLASTY;  Surgeon: Wellington Hampshire, MD;  Location: [facility];  Service: Cardiovascular;  Laterality: Left;  lt peroneal and ant tibial arteries cutting balloon  . REFRACTIVE SURGERY Bilateral   . STUMP REVISION Right 05/23/2019   Procedure: REVISION OF RIGHT  BKA;  Surgeon: [name], MD;  Location: [facility];  Service: Orthopedics;  Laterality: Right;  . TOE AMPUTATION Bilateral    "I've lost 7 toes over the last 7 years" (05/09/2014)  . TOE SURGERY Left April 2015   Big toe removed on left foot.  . TONSILLECTOMY    . TOTAL KNEE ARTHROPLASTY Left 04/10/2014   Procedure: TOTAL KNEE ARTHROPLASTY;  Surgeon: [name], MD;  Location: [facility];  Service: Orthopedics;  Laterality: Left;  . TOTAL KNEE REVISION Left 10/25/2014   Procedure: LEFT TOTAL KNEE REVISION;  Surgeon: [name], MD;  Location: [facility];  Service: Orthopedics;  Laterality: Left;  . TOTAL KNEE REVISION Left 11/26/2015   Procedure: Removal Left Total Knee Arthroplasty, Hinged Total Knee Arthroplasty;  Surgeon: [name], MD;  Location: [facility];  Service: Orthopedics;  Laterality: Left;  . UVULOPALATOPHARYNGOPLASTY, TONSILLECTOMY AND SEPTOPLASTY  ~ 1989  . WOUND DEBRIDEMENT Left 05/09/2014   Dehiscence Left Total Knee Arthroplasty Incision       Family History  Problem Relation Age of Onset  . Hypertension Mother   .  Cancer Mother 56       Ovarian  . Heart disease Maternal Aunt   . Stroke Maternal Grandfather     Social History   Tobacco Use  . Smoking status: Former Smoker    Packs/day: 0.12    Years: 32.00    Pack years: 3.84    Types: Cigarettes    Quit date: 01/01/2019    Years since quitting: 0.4  . Smokeless tobacco: Never Used  . Tobacco comment: 1 per week  Vaping Use  . Vaping Use: Never used  Substance Use Topics  . Alcohol use: No    Alcohol/week: 0.0 standard drinks  . Drug use: No    Home Medications Prior to Admission medications   Medication Sig Start Date End Date Taking? Authorizing Provider  acetaminophen (TYLENOL) 500 MG tablet Take 500-1,000 mg by mouth every 6 (six) hours as needed for mild pain or fever.    Yes [provider]  amLODipine (NORVASC) 5 MG tablet Take 5 mg by mouth every evening.    Yes [provider]  aspirin EC 81 MG EC tablet Take 2 tablets (162 mg total) by mouth daily. Patient taking differently: Take 162 mg by mouth in the morning.  01/20/17  Yes Patrecia Pour, Christean Grief, MD  atorvastatin (LIPITOR) 40 MG tablet Take 1 tablet (40 mg total) by mouth daily. 06/19/19 09/17/19 Yes Wellington Hampshire, MD  carvedilol (COREG) 12.5 MG tablet Take 1 tablet (12.5 mg total) by mouth 2 (two) times daily with a meal. 02/08/17  Yes Dana Allan I, MD  CREON [phone] units CPEP capsule Take [phone] Units by mouth See admin instructions. Take 2 capsules (72,000 units) by mouth with three times a day with meals and 1 capsule (36,000 units) before a bedtime snack 03/20/19  Yes [provider]  gabapentin (NEURONTIN) 100 MG capsule Take 200 mg by mouth daily.  09/27/18  Yes [provider]  hydrOXYzine (ATARAX/VISTARIL) 50 MG tablet Take 50 mg by mouth 3 (three) times daily as needed for itching.    Yes [provider]  loperamide (IMODIUM A-D) 2 MG tablet Take 2 mg by mouth 4 (four) times daily as needed for diarrhea or  loose stools.   Yes [provider]  methocarbamol (ROBAXIN) 500 MG tablet Take 500 mg by mouth in the morning and at bedtime.  Yes [provider]  montelukast (SINGULAIR) 10 MG tablet Take 10 mg by mouth at bedtime.  08/14/18  Yes [provider]  Nutritional Supplements (NOVASOURCE RENAL) LIQD Take 1 Bottle by mouth daily.   Yes [provider]  sertraline (ZOLOFT) 100 MG tablet Take 100 mg by mouth in the morning.  04/18/19  Yes [provider]  temazepam (RESTORIL) 30 MG capsule Take 30 mg by mouth at bedtime as needed for sleep.    Yes [provider]  Amino Acids-Protein Hydrolys (FEEDING SUPPLEMENT, PRO-STAT SUGAR FREE 64,) LIQD Take 30 mLs by mouth 2 (two) times daily. 01/05/19   Debbe Odea, MD  bisacodyl (DULCOLAX) 10 MG suppository Place 1 suppository (10 mg total) rectally daily as needed for moderate constipation. 01/05/19   Debbe Odea, MD  Darbepoetin Alfa (ARANESP) 150 MCG/0.3ML SOSY injection Inject 0.3 mLs (150 mcg total) into the vein every Friday with hemodialysis. 02/11/17   Bonnell Public, MD  docusate sodium (COLACE) 100 MG capsule Take 1 capsule (100 mg total) by mouth 2 (two) times daily. 01/05/19   Debbe Odea, MD  heparin 1000 UNIT/ML injection Inject 4 mLs into the vein See admin instructions. Every hemodialysis treatment 08/14/18 08/13/19  [provider]  menthol-cetylpyridinium (CEPACOL) 3 MG lozenge Take 1 lozenge (3 mg total) by mouth as needed for sore throat. 01/05/19   Debbe Odea, MD  multivitamin (RENA-VIT) TABS tablet Take 1 tablet by mouth at bedtime. 02/08/17   Bonnell Public, MD    Allergies    Morphine and related, Tygacil [tigecycline], and Imodium [loperamide]  Review of Systems   Review of Systems  Respiratory: Negative for shortness of breath.   Cardiovascular: Negative for chest pain.  Gastrointestinal: Positive for diarrhea. Negative for abdominal pain, nausea and vomiting.   Neurological: Negative for loss of consciousness and headaches.  Psychiatric/Behavioral: Positive for confusion.  All other systems reviewed and are negative.   Physical Exam Updated Vital Signs BP (!) 149/84 (BP Location: Right Arm)   Pulse 71   Temp 98.4 F (36.9 C) (Oral)   Resp 18   Wt 55.9 kg   SpO2 97%   BMI 17.68 kg/m   Physical Exam Vitals and nursing note reviewed.  Constitutional:      General: He is not in acute distress.    Appearance: He is well-developed. He is ill-appearing.  HENT:     Head: Normocephalic and atraumatic.     Right Ear: External ear normal.     Left Ear: External ear normal.     Nose: Nose normal.     Mouth/Throat:     Mouth: Mucous membranes are moist.  Eyes:     Extraocular Movements: Extraocular movements intact.     Conjunctiva/sclera: Conjunctivae normal.     Pupils: Pupils are equal, round, and reactive to light.  Cardiovascular:     Rate and Rhythm: Normal rate and regular rhythm.     Heart sounds: No murmur heard.   Pulmonary:     Effort: Pulmonary effort is normal. No respiratory distress.     Breath sounds: Normal breath sounds.  Abdominal:     Palpations: Abdomen is soft.     Tenderness: There is no abdominal tenderness.  Musculoskeletal:     Cervical back: Neck supple.     Comments: Patient has bilateral lower extremity amputations.  Left is rather old and stable.  Right is more recent but surgical site is clean dry and intact.  Skin:  General: Skin is warm and dry.  Neurological:     General: No focal deficit present.     Mental Status: He is alert. He is disoriented.  Psychiatric:        Mood and Affect: Mood normal.        Behavior: Behavior normal.     ED Results / Procedures / Treatments   Labs (all labs ordered are listed, but only abnormal results are displayed) Labs Reviewed  COMPREHENSIVE METABOLIC PANEL - Abnormal; Notable for the following components:      Result Value   Glucose, Bld 105 (*)     BUN 65 (*)    Creatinine, Ser 8.29 (*)    Calcium 8.8 (*)    Albumin 2.3 (*)    GFR calc non Af Amer 6 (*)    GFR calc Af Amer 7 (*)    All other components within normal limits  CBC - Abnormal; Notable for the following components:   RBC 2.81 (*)    Hemoglobin 8.8 (*)    HCT 28.3 (*)    MCV 100.7 (*)    RDW 17.0 (*)    Platelets 83 (*)    All other components within normal limits  CBG MONITORING, ED    EKG None  Radiology DG Chest Port 1 View  Result Date: 06/20/2019 CLINICAL DATA:  Altered mental status EXAM: PORTABLE CHEST 1 VIEW COMPARISON:  12/29/2018 FINDINGS: Prior CABG. Cardiomegaly with vascular congestion and probable early interstitial edema. No effusions or acute bony abnormality. IMPRESSION: Cardiomegaly with vascular congestion and early interstitial edema. Electronically Signed   By: Rolm Baptise M.D.   On: 07/09/2019 21:58    Procedures Procedures (including critical care time)  Medications Ordered in ED Medications  sodium chloride flush (NS) 0.9 % injection 3 mL (has no administration in time range)    ED Course  I have reviewed the triage vital signs and the nursing notes.  Pertinent labs & imaging results that were available during my care of the patient were reviewed by me and considered in my medical decision making (see chart for details).  Clinical Course as of Jun 25 1737  Mon Jun 25, 2019  2114 BUN(!): 65 [AL]    Clinical Course User Index [AL] Delma Post, MD   MDM Rules/Calculators/A&P                          Differential diagnosis: Altered mental status, uremia, ESRD on dialysis, unable to perform ADLs  ED physician interpretation of imaging: Chest x-ray without hemopneumothorax, widened this time, focal pneumonia. ED physician interpretation of EKG: No STEMI.  No peak T waves ED physician interpretation of labs: Elevated BUN concerning for uremia causing altered mental status.  No hyperkalemia at this time.  Otherwise labs  unremarkable  MDM: Patient is a 59 year old male with chronic medical problems who was recently in a skilled nursing facility and discharged into the care of the son.  Patient has not had dialysis since the 9th with worsening uremia, altered mental status as well as slowly developing pulmonary edema requiring hospital admission for altered mental status as well as hemodialysis and further evaluation.  Patient's vital signs are stable, patient afebrile.  Patient physical exam remarkable for somnolent but is arousable patient.  Most likely this patient is having symptoms of missed dialysis for greater than 5 days with symptoms of uremia.  No indication for emergency dialysis as potassium is within normal limits.  Inpatient service contacted and patient admitted for further treatment and evaluation.  Diagnosis, treatment and plan of care was discussed and agreed upon with patient.  Patient comfortable with admission at this time.  Final Clinical Impression(s) / ED Diagnoses Final diagnoses:  Altered mental status, unspecified altered mental status type  Uremia  ESRD on dialysis [facility])    Rx / DC Orders ED Discharge Orders    None       Delma Post, MD 06/26/19 1745    Merrily Pew, MD 07/01/19 2055

## 2019-06-25 NOTE — H&P (Signed)
Date: 06/26/2019               Patient Name:  Matthew Kline MRN: [id number]  DOB: 1960-10-07 Age / Sex: 59 y.o., male   PCP: [facility] [place] Service: Internal Medicine Teaching Service         Attending Physician: Dr. Sid Falcon, MD    First Contact: Dr. Ronnald Kline Pager: [phone]  Second Contact: Dr. Truman Kline Pager: [phone]       After Hours (After 5p/  First Contact Pager: [phone]  weekends / holidays): Second Contact Pager: [phone]   Chief Complaint: Altered mental status  History of Present Illness:  Patient is a 59 year old male with past medical history significant for ESRD on HD Monday/Wednesday/Friday, HFrEF, bilateral AKA who presents for evaluation of altered mental status, deconditioning in the setting of recent hospitalization and physical rehab stay.  Patient denies new acute complaint.  Per son, patient has had difficulty caring for himself.  Son states that patient was taken to HD session today but was sent back after he had a bowel movement upon himself.  Son had concern given the duration he had been without dialysis, called dialysis facility, and was instructed to take patient to the hospital.  Son states that it has also been difficult to feed patient as he would forget that he had not eaten and conversation is circular.  Son states that he went to patient's house and there was diarrhea all over the bed.  Nursing facility had reported that patient had diarrhea, was negative for C. difficile infection. Patient denies recent antibiotic usage. Patient's last HD session was on Wednesday, 06/20/2019.  Patient denies chest pain, shortness of breath, nausea, or vomiting.  Son reports that the patient requires a level of assistance much higher than the rehabilitation facility communicated.  Meds:  Cardiac: *Amlodipine 5 mg every evening *Aspirin 81 mg daily *Atorvastatin 40 mg daily *Carvedilol 12.5 mg twice daily Neurologic: *Gabapentin 200 mg  daily *Sertraline 100 mg daily *Temazepam 30 mg at bedtime Other: *Feeding supplement twice daily *Dulcolax suppository as needed for constipation *Creon 2 capsules with meals and 1 capsule with snacks *Aranesp, heparin with HD *Colace 20 mg twice daily *Hydroxyzine 50 mg 3 times daily as needed for itching *Singulair 10 mg at bedtime as needed for allergies *Multivitamin daily *Tylenol as needed  Allergies: Allergies as of 06/19/2019 - Review Complete 07/09/2019  Allergen Reaction Noted  . Morphine and related Other (See Comments) 02/01/2012  . Tygacil [tigecycline] Nausea And Vomiting and Other (See Comments) 09/22/2011  . Imodium [loperamide] Itching 07/26/2018   Past Medical History:  Diagnosis Date  . Anemia, unspecified   . Anxiety   . Arthralgia 2010   polyarticular  . Arthritis    "back, knees" (01/10/2017)  . Cancer [facility])    "kidney area" (01/10/2017)  . CHF (congestive heart failure) ([place]) 07/25/2009   denies  . Chronic lower back pain   . Coronary artery disease   . Coughing    pt. reports that he has drainage from sinus infection  . Diabetic foot ulcer ([place])   . Diabetic neuropathy ([place])   . ESRD (end stage renal disease) on dialysis [facility])    started 12/2012; "MWF; [facility] "  (01/10/2017)  . GERD (gastroesophageal reflux disease)    hx "before I lost weight", no problem 9 years  . Hemodialysis access site with mature fistula ([place])   .  Hemorrhoids, internal 10/2011   small  . High cholesterol   . History of blood transfusion    "related to the anemia"  . Hypertension   . Insomnia, unspecified   . 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 Long term (current) use of anticoagulants   . Myocardial infarction ([place]) 1995  . Orthostatic hypotension   . Osteomyelitis of foot, left, acute ([place])   . Other chronic postoperative pain   . Pneumonia    "probably twice" (01/10/2017)  . Polymyalgia rheumatica ([place])   . Renal insufficiency   .  Sleep apnea    "lost weight; no more problem" (01/10/2017)  . Stroke ([place]) 01/10/06   denies residual on 05/09/2014  . Type II diabetes mellitus ([place]) dx'd 1995  . Unspecified hereditary and idiopathic peripheral neuropathy    feet  . Unspecified osteomyelitis, site unspecified   . Unspecified vitamin D deficiency     Family History:  Family History  Problem Relation Age of Onset  . Hypertension Mother   . Cancer Mother 13       Ovarian  . Heart disease Maternal Aunt   . Stroke Maternal Grandfather     Social History:  *Patient denies alcohol, tobacco, or recreational drug use *Patient recently discharged from [facility]. Per son Matthew Kline), plan was for patient to go to [place] to live with Matthew Kline and his wife  Review of Systems: A complete ROS was negative except as per HPI.  Physical Exam: Blood pressure 124/78, pulse (!) 58, temperature 98.4 F (36.9 C), temperature source Oral, resp. rate 10, SpO2 94 %. Physical Exam Constitutional:      Appearance: He is not diaphoretic.  HENT:     Head: Normocephalic and atraumatic.  Eyes:     General:        Right eye: No discharge.        Left eye: No discharge.  Neck:     Trachea: No tracheal deviation.  Cardiovascular:     Rate and Rhythm: Normal rate and regular rhythm.     Heart sounds: No murmur heard.  No friction rub. No gallop.   Pulmonary:     Effort: Pulmonary effort is normal. No respiratory distress.     Breath sounds: Normal breath sounds. No wheezing or rales.  Abdominal:     General: There is no distension.     Palpations: Abdomen is soft.     Tenderness: There is no abdominal tenderness. There is no guarding or rebound.  Musculoskeletal:        General: No tenderness or deformity. Normal range of motion.     Cervical back: Normal range of motion.  Skin:    General: Skin is warm and dry.     Findings: No erythema or rash.  Neurological:     General: No focal deficit present.     Mental Status: He is  alert and oriented to person, place, and time.     Motor: No weakness.     Coordination: Coordination normal.  Psychiatric:        Cognition and Memory: Memory normal.        Judgment: Judgment normal.    CXR: personally reviewed my interpretation is increased pulmonary vascular markings  CBC Latest Ref Rng & Units 07/10/2019 05/30/2019 05/29/2019  WBC 4.0 - 10.5 K/uL 6.5 8.0 9.1  Hemoglobin 13.0 - 17.0 g/dL 8.8(L) 7.3(L) 7.4(L)  Hematocrit 39 - 52 % 28.3(L) 23.4(L) 23.5(L)  Platelets 150 - 400  K/uL 83(L) 150 140(L)   CMP Latest Ref Rng & Units 07/03/2019 05/30/2019 05/29/2019  Glucose 70 - 99 mg/dL 105(H) 92 103(H)  BUN 6 - 20 mg/dL 65(H) 40(H) 22(H)  Creatinine 0.61 - 1.24 mg/dL 8.29(H) 3.71(H) 2.55(H)  Sodium 135 - 145 mmol/L 137 134(L) 137  Potassium 3.5 - 5.1 mmol/L 4.7 4.0 3.9  Chloride 98 - 111 mmol/L 102 100 101  CO2 22 - 32 mmol/L 23 26 27   Calcium 8.9 - 10.3 mg/dL 8.8(L) 8.4(L) 8.4(L)  Total Protein 6.5 - 8.1 g/dL 6.9 - 6.4(L)  Total Bilirubin 0.3 - 1.2 mg/dL 1.1 - 1.0  Alkaline Phos 38 - 126 U/L 106 - 82  AST 15 - 41 U/L 17 - 17  ALT 0 - 44 U/L 8 - 7   CXR (07/01/2019): FINDINGS: Prior CABG. Cardiomegaly with vascular congestion and probable early interstitial edema. No effusions or acute bony abnormality. IMPRESSION: Cardiomegaly with vascular congestion and early interstitial edema.  TTE (08/01/18), Prior Admission: 1. The left ventricle has severely reduced systolic function, with an  ejection fraction of 25-30%. The cavity size was normal. Left ventricular  diastolic function could not be evaluated due to nondiagnostic images.  Left ventricular diffuse hypokinesis.  2. Spontaneous echo contrast noted in LV suggestive of low flow.  3. The right ventricle has moderately reduced systolic function. The  cavity was severely enlarged. There is no increase in right ventricular  wall thickness. Right ventricular systolic pressure is mildly elevated  with PASP 19mmHg.    4. Left atrial size was moderately dilated.  5. Right atrial size was moderately dilated.  6. The aortic valve is tricuspid. Moderate sclerosis of the aortic valve.  Moderate aortic annular calcification noted.  7. There is reduced motion of AV cusps but no doppler interrogation of  the AV was done.  8. The mitral valve is degenerative. Moderate thickening of the mitral  valve leaflet. There is mild mitral annular calcification present.  9. The inferior vena cava was dilated in size with <50% respiratory  variability.  10. Recommend limited echo to assess for AS.  11. The aorta is normal in size and structure.  Assessment & Plan by Problem: Principal Problem:   Acute encephalopathy Active Problems:   Essential hypertension   Type II diabetes mellitus (HCC)   ESRD on hemodialysis [facility])  Patient is a 59 year old male with past medical history significant for ESRD on HD Monday/Wednesday/Friday, HFrEF, bilateral AKA who presents for evaluation of altered mental status, deconditioning in the setting of recent hospitalization and physical rehab stay.    # ESRD on HD: Patient last HD session 5 days prior.  Patient with out significant metabolic derangement on laboratory studies - BUN of 65, potassium 4.7, bicarb 23.  With crackles on exam, pulmonary vascular congestion on chest x-ray, but without respiratory distress or supplemental oxygen requirement.  No indication for emergent dialysis. *Consult nephrology in AM   # Chronic deconditioning # Possible altered mental status Patient with difficulty caring for himself, unclear if change is acute versus chronic.  Patient is slow to respond to questions but is alert and oriented.  Will assess if mental status improves following hemodialysis.  Patient without significant metabolic derangement, has diarrhea but was C. difficile negative at facility, is without neurologic deficit to suggest structural abnormality, no clear toxin exposures or  medication changes. *GI panel PCR *Contact precautions  # Heart failure with reduced EF: Patient with EF 25-30% from TTE on 08/01/18. Will continue home  medications *Amlodipine 5 mg daily + carvedilol 12.5 mg twice daily + atorvastatin 40 mg daily + aspirin 81 mg daily  # Psych/Neuro: Continue home sertraline 100 mg daily. Will hold gabapentin and temazepam to limit centrally acting medications given mental status concerns.   Dispo: Admit patient to Inpatient with expected length of stay greater than 2 midnights.  Signed: Jeanmarie Hubert, MD 06/26/2019, 12:00 AM

## 2019-06-25 NOTE — ED Triage Notes (Addendum)
Pt arrives with his son who helps provide history. States pt was discharged from [facility] on Saturday, seemed okay Saturday, but Sunday woke up more confused with decreased mobility and diarrhea-denies recent antibiotics. Last dialysis was 6/9. pts son states he attempted to take pt to dialysis today but pt stated he did not want to be dialyzed so they sent him home. pts son then called dialysis center to make them aware pt had not be dialyzed since 6/9 and asked if he needed to bring pt to the ER which they did advise him to do. Pt alert and oriented to person, place, situation, disoriented to time. resp e/u, nad.

## 2019-06-26 DIAGNOSIS — N19 Unspecified kidney failure: Secondary | ICD-10-CM

## 2019-06-26 DIAGNOSIS — I10 Essential (primary) hypertension: Secondary | ICD-10-CM

## 2019-06-26 DIAGNOSIS — Z992 Dependence on renal dialysis: Secondary | ICD-10-CM

## 2019-06-26 DIAGNOSIS — G9340 Encephalopathy, unspecified: Secondary | ICD-10-CM

## 2019-06-26 DIAGNOSIS — N186 End stage renal disease: Secondary | ICD-10-CM

## 2019-06-26 LAB — RENAL FUNCTION PANEL
Albumin: 2.2 g/dL — ABNORMAL LOW (ref 3.5–5.0)
Anion gap: 13 (ref 5–15)
BUN: 72 mg/dL — ABNORMAL HIGH (ref 6–20)
CO2: 24 mmol/L (ref 22–32)
Calcium: 8.6 mg/dL — ABNORMAL LOW (ref 8.9–10.3)
Chloride: 103 mmol/L (ref 98–111)
Creatinine, Ser: 8.94 mg/dL — ABNORMAL HIGH (ref 0.61–1.24)
GFR calc Af Amer: 7 mL/min — ABNORMAL LOW (ref >60)
GFR calc non Af Amer: 6 mL/min — ABNORMAL LOW (ref >60)
Glucose, Bld: 121 mg/dL — ABNORMAL HIGH (ref 70–99)
Phosphorus: 6.5 mg/dL — ABNORMAL HIGH (ref 2.5–4.6)
Potassium: 4.7 mmol/L (ref 3.5–5.1)
Sodium: 140 mmol/L (ref 135–145)

## 2019-06-26 LAB — GASTROINTESTINAL PANEL BY PCR, STOOL (REPLACES STOOL CULTURE)

## 2019-06-26 LAB — CBC
HCT: 28.6 % — ABNORMAL LOW (ref 39.0–52.0)
Hemoglobin: 8.9 g/dL — ABNORMAL LOW (ref 13.0–17.0)
MCH: 31.1 pg (ref 26.0–34.0)
MCHC: 31.1 g/dL (ref 30.0–36.0)
MCV: 100 fL (ref 80.0–100.0)
Platelets: 76 10*3/uL — ABNORMAL LOW (ref 150–400)
RBC: 2.86 MIL/uL — ABNORMAL LOW (ref 4.22–5.81)
RDW: 16.7 % — ABNORMAL HIGH (ref 11.5–15.5)
WBC: 5 10*3/uL (ref 4.0–10.5)
nRBC: 0 % (ref 0.0–0.2)

## 2019-06-26 LAB — HEPATITIS B SURFACE ANTIGEN: Hepatitis B Surface Ag: NONREACTIVE

## 2019-06-26 MED ORDER — PANCRELIPASE (LIP-PROT-AMYL) 36000-114000 UNITS PO CPEP: 36000.0000 [IU] | ORAL_CAPSULE | Freq: Two times a day (BID) | ORAL | Status: DC | PRN

## 2019-06-26 MED ORDER — CHLORHEXIDINE GLUCONATE CLOTH 2 % EX PADS: 6.0000 | MEDICATED_PAD | Freq: Every day | CUTANEOUS | Status: DC

## 2019-06-26 MED ORDER — DARBEPOETIN ALFA 100 MCG/0.5ML IJ SOSY
100.0000 ug | PREFILLED_SYRINGE | INTRAMUSCULAR | Status: AC
  Administered 2019-06-26: 100 ug via INTRAVENOUS
  Filled 2019-06-26: qty 0.5

## 2019-06-26 MED ORDER — GERHARDT'S BUTT CREAM
TOPICAL_CREAM | CUTANEOUS | Status: DC | PRN
  Filled 2019-06-26: qty 1

## 2019-06-26 NOTE — Progress Notes (Signed)
New Admission Note:  Arrival Method: Via stretcher from ED to 58m19 Mental Orientation: Alert & Oriented to self Telemetry: CCMD verified Assessment: Completed Skin: Refer to flowsheet IV: Right FA Pain: 0/10 Safety Measures: Safety Fall Prevention Plan discussed with patient. Admission: Completed 5 [place] Orientation: Patient has been orientated to the room, unit and the staff.  Orders have been reviewed and are being implemented. Will continue to monitor the patient. Call light has been placed within reach and bed alarm has been activated.   Vassie Moselle, RN  Phone Number: [phone]

## 2019-06-26 NOTE — Procedures (Signed)
   I was present at this dialysis session, have reviewed the session itself and made  appropriate changes Kelly Splinter MD [place] pager [phone]   06/26/2019, 3:21 PM

## 2019-06-26 NOTE — Evaluation (Signed)
Occupational Therapy Evaluation Patient Details Name: Matthew Kline MRN: [id number] DOB: 1960/11/12 Today's Date: 06/26/2019    History of Present Illness Patient is a 58 year old male with past medical history significant for ESRD on HD Monday/Wednesday/Friday, HFrEF, bilateral AKA who presents for evaluation of altered mental status, deconditioning in the setting of recent hospitalization and physical rehab stay.  Patient denies new acute complaint.  Per son, patient has had difficulty caring for himself.    Clinical Impression   Pt admitted with the above. Pt currently with functional limitations due to the deficits listed below (see OT Problem List).  Pt will benefit from skilled OT to increase their safety and independence with ADL and functional mobility for ADL to facilitate discharge to venue listed below.   Son called during OT session.  Son and his wife not able to provide care needed for this pt     Follow Up Recommendations  SNF    Equipment Recommendations  None recommended by OT       Precautions / Restrictions Precautions Precautions: Fall Other Brace: Noted he had a fall from bed at SNF, and was placed in a C collar; Cervical brace not in the room during session today -- noted he told Matthew Kline, Medical Student that a physician had told him he no longer needs it; Paged Internal Medicine to discuss      Mobility Bed Mobility Overal bed mobility: Needs Assistance Bed Mobility: Supine to Sit;Sit to Supine;Rolling Rolling: Min guard   Supine to sit: Min guard Sit to supine: Min guard   General bed mobility comments: Good use of bedrails to roll; pushed up to sit via elbow prop; and went from upright to elbow prop to supine to lay back down  Transfers Overall transfer level: Needs assistance   Transfers: Lateral/Scoot Transfers          Lateral/Scoot Transfers: Min guard General transfer comment: Simultated lateral scoot transers with scooting in the  bed; good use of bil UE push to unwigh hips for scooting    Balance Overall balance assessment: Needs assistance Sitting-balance support: Single extremity supported;Bilateral upper extremity supported;No upper extremity supported Sitting balance-Leahy Scale: Fair (approaching good) Sitting balance - Comments: Sat EOB unsupported to eat some breakfast before HD; see also OT note for other details related to ADLs                                  ADL either performed or assessed with clinical judgement   ADL Overall ADL's : Needs assistance/impaired Eating/Feeding: Sitting;Minimal assistance Eating/Feeding Details (indicate cue type and reason): hand held sandwich Grooming: Wash/dry face;Sitting;Minimal assistance                                 General ADL Comments: pt sat EOB and had breakfast. Pt was going for HD and had not eaten.  Pt able to sit EOB and reach for items on tray with min A.  OT did speak to son on phone and family not able to care for pt at this time.  Will likely need SNF or ALF     Vision Patient Visual Report: No change from baseline              Pertinent Vitals/Pain Pain Assessment: Faces Faces Pain Scale: Hurts little more Pain Location: Grimace with pericare Pain Descriptors / Indicators: Grimacing  Pain Intervention(s): Monitored during session     Hand Dominance Right   Extremity/Trunk Assessment Upper Extremity Assessment Upper Extremity Assessment: Generalized weakness (OT did note some ataxia with holding phone in R hand)   Lower Extremity Assessment Lower Extremity Assessment: RLE deficits/detail;LLE deficits/detail RLE Deficits / Details: AKA; Hip ROM WFL LLE Deficits / Details: AKA; Hip ROM WFL       Communication     Cognition Arousal/Alertness: Awake/alert Behavior During Therapy: WFL for tasks assessed/performed (very distractable) Overall Cognitive Status: Impaired/Different from baseline Area of  Impairment: Orientation;Problem solving;Awareness;Safety/judgement                 Orientation Level: Disoriented to;Time;Situation       Safety/Judgement: Decreased awareness of safety;Decreased awareness of deficits Awareness: Intellectual Problem Solving: Slow processing;Decreased initiation;Difficulty sequencing;Requires verbal cues;Requires tactile cues General Comments: Unaware that he was incontinent of bowel              Home Living Family/patient expects to be discharged to:: [place]: Children (though unsure how much assist son can provide) Available Help at Discharge: Family Type of Home: Apartment Home Access: Level entry     Home Layout: One level     Bathroom Shower/Tub: Tub/shower unit         Home Equipment: Environmental consultant - 2 wheels;Wheelchair - manual;Tub bench (L LE prosthetic)   Additional Comments: information per chart      Prior Functioning/Environment Level of Independence: Needs assistance  Gait / Transfers Assistance Needed: Reports no difficulty with lateral scoot to his wheelchair     Comments: Unsure of reliability as a historian; he indicated no difficulty with transfers, yet, found by son in unkept situation        OT Problem List: Decreased strength;Decreased activity tolerance;Impaired balance (sitting and/or standing);Decreased safety awareness;Decreased knowledge of precautions;Decreased knowledge of use of DME or AE      OT Treatment/Interventions: Self-care/ADL training;DME and/or AE instruction;Patient/family education;Therapeutic activities;Therapeutic exercise    OT Goals(Current goals can be found in the care plan section) Acute Rehab OT Goals Patient Stated Goal: Did not specifically state; agreeable to sittingup to eat OT Goal Formulation: With patient Time For Goal Achievement: 06/26/19 Potential to Achieve Goals: Good ADL Goals Pt Will Perform Lower Body Bathing: with min  assist;sitting/lateral leans Pt Will Perform Lower Body Dressing: with min guard assist;sitting/lateral leans Pt Will Transfer to Toilet: with min assist;anterior/posterior transfer Pt Will Perform Toileting - Clothing Manipulation and hygiene: with min guard assist;sitting/lateral leans  OT Frequency: Min 2X/week   Barriers to D/C: Decreased caregiver support          Co-evaluation PT/OT/SLP Co-Evaluation/Treatment: Yes Reason for Co-Treatment: Necessary to address cognition/behavior during functional activity PT goals addressed during session: Mobility/safety with mobility        AM-PAC OT "6 Clicks" Daily Activity     Outcome Measure Help from another person eating meals?: A Little Help from another person taking care of personal grooming?: A Little Help from another person toileting, which includes using toliet, bedpan, or urinal?: A Lot Help from another person bathing (including washing, rinsing, drying)?: A Lot Help from another person to put on and taking off regular upper body clothing?: A Little Help from another person to put on and taking off regular lower body clothing?: A Lot 6 Click Score: 15   End of Session Nurse Communication: Mobility status  Activity Tolerance: Patient tolerated treatment well Patient left: in bed;with nursing/sitter in room  OT Visit  Diagnosis: Muscle weakness (generalized) (M62.81);History of falling (Z91.81)                Time: 2831-5176 OT Time Calculation (min): 38 min Charges:  OT General Charges $OT Visit: 1 Visit OT Evaluation $OT Eval Moderate Complexity: 1 Mod  Kari Baars, [place] [phone] Office- [phone], Edwena Felty D 06/26/2019, 3:58 PM

## 2019-06-26 NOTE — Consult Note (Signed)
[facility] Renal Consultation Note    Indication for Consultation:  Management of ESRD/hemodialysis, anemia, hypertension/volume, and secondary hyperparathyroidism.  HPI: Matthew Kline is a 59 y.o. 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 Of note, pt has had multiple recent hospitalizations and was discharged on 06/01/19 after a fall at his SNF. He returned to his SNF but is now back at home with his son. Pt presented with several days of worsening confusion, diarrhea, and missed HD. Last dialysis session was 06/20/19. Pt reports he has been unable to complete HD due to diarrhea. VSS with BP 126/76, O2 93% on RA. Labs notable for K+ 4.7, BUN 65, Cr 8.29, Alb 2.3, Hgb 8.8. CXR on 06/14/2019 showed "Cardiomegaly with vascular congestion and early interstitial edema." Nephrology has been consulted for management of ESRD.   Patient denies SOB, orthopnea, CP, palpitations, dizziness, abdominal pain, nausea and vomiting. No fever fevers, chills, or blood in stool. Patient states he does wish to continue dialysis. His primary concern is his diarrhea and feels if this issue is resolved, he can attend dialysis more consistently.   Past Medical History:  Diagnosis Date  . Anemia, unspecified   . Anxiety   . Arthralgia 2010   polyarticular  . Arthritis    "back, knees" (01/10/2017)  . Cancer [facility])    "kidney area" (01/10/2017)  . CHF (congestive heart failure) ([place]) 07/25/2009   denies  . Chronic lower back pain   . Coronary artery disease   . Coughing    pt. reports that he has drainage from sinus infection  . Diabetic foot ulcer ([place])   . Diabetic neuropathy ([place])   . ESRD (end stage renal disease) on dialysis [facility])    started 12/2012; "MWF; [place] "  (01/10/2017)  . GERD (gastroesophageal reflux disease)    hx "before I lost weight", no problem 9 years  . Hemodialysis access site with  mature fistula ([place])   . Hemorrhoids, internal 10/2011   small  . High cholesterol   . History of blood transfusion    "related to the anemia"  . Hypertension   . Insomnia, unspecified   . Lacunar infarction ([place]) 2006   RUE/RLE, speech  . Long term (current) use of anticoagulants   . Myocardial infarction ([place]) 1995  . Orthostatic hypotension   . Osteomyelitis of foot, left, acute ([place])   . Other chronic postoperative pain   . Pneumonia    "probably twice" (01/10/2017)  . Polymyalgia rheumatica ([place])   . Renal insufficiency   . Sleep apnea    "lost weight; no more problem" (01/10/2017)  . Stroke ([place]) 01/10/06   denies residual on 05/09/2014  . Type II diabetes mellitus ([place]) dx'd 1995  . Unspecified hereditary and idiopathic peripheral neuropathy    feet  . Unspecified osteomyelitis, site unspecified   . Unspecified vitamin D deficiency    Past Surgical History:  Procedure Laterality Date  . ABDOMINAL AORTOGRAM N/A 08/25/2016   Procedure: ABDOMINAL AORTOGRAM;  Surgeon: Wellington Hampshire, MD;  Location: [place] [facility];  Service: Cardiovascular;  Laterality: N/A;  . AMPUTATION  01/21/2012   Procedure: AMPUTATION RAY;  Surgeon: [name], MD;  Location: [place];  Service: Orthopedics;  Laterality: Left;  Left Foot 4th Ray Amputation  . AMPUTATION Left 05/04/2013   Procedure: AMPUTATION DIGIT;  Surgeon: [name], MD;  Location: [place];  Service: Orthopedics;  Laterality: Left;  Left Great Toe Amputation at [facility]  . AMPUTATION Left 01/14/2017   Procedure: AMPUTATION ABOVE LEFT KNEE;  Surgeon: [name], MD;  Location: [place];  Service: Orthopedics;  Laterality: Left;  . AMPUTATION Right 12/31/2018   Procedure: AMPUTATION BELOW KNEE;  Surgeon: [name], MD;  Location: [place];  Service: Orthopedics;  Laterality: Right;  . AMPUTATION Right 05/25/2019   Procedure: RIGHT ABOVE KNEE AMPUTATION;  Surgeon: [name], MD;  Location: [place];  Service: Orthopedics;   Laterality: Right;  . ANTERIOR CERVICAL DECOMP/DISCECTOMY FUSION  02/2011  . APPLICATION OF WOUND VAC Right 12/31/2018   Procedure: Application Of Wound Vac;  Surgeon: [name], MD;  Location: [place];  Service: Orthopedics;  Laterality: Right;  . BACK SURGERY    . BASCILIC VEIN TRANSPOSITION Left 10/19/2012   Procedure: BASCILIC VEIN TRANSPOSITION;  Surgeon: Serafina Mitchell, MD;  Location: [place];  Service: Vascular;  Laterality: Left;  . CARDIAC CATHETERIZATION     "before bypass"  . CHOLECYSTECTOMY N/A 08/02/2018   Procedure: LAPAROSCOPIC CHOLECYSTECTOMY WITH INTRAOPERATIVE CHOLANGIOGRAM;  Surgeon: Donnie Mesa, MD;  Location: [place];  Service: General;  Laterality: N/A;  . CORONARY ARTERY BYPASS GRAFT     x 5 with lima at [place] SPACERS Left 08/07/2014   Procedure: Replace Left Total Knee Arthroplasty,  Place Antibiotic Spacer;  Surgeon: [name], MD;  Location: [place];  Service: Orthopedics;  Laterality: Left;  . I & D EXTREMITY Left 05/09/2014   Procedure: Irrigation and Debridement Left Knee and Closure of Total Knee Arthroplasty Incision;  Surgeon: [name], MD;  Location: [place];  Service: Orthopedics;  Laterality: Left;  . I & D KNEE WITH POLY EXCHANGE Left 05/31/2014   Procedure: IRRIGATION AND DEBRIDEMENT LEFT KNEE, PLACE ANTIBIOTIC BEADS,  POLY EXCHANGE;  Surgeon: [name], MD;  Location: [place];  Service: Orthopedics;  Laterality: Left;  . INGUINAL HERNIA REPAIR Bilateral 11/21/2018   Procedure: BILATERAL INGUINAL HERNIA REPAIR WITH MESH;  Surgeon: Coralie Keens, MD;  Location: [place];  Service: General;  Laterality: Bilateral;  . IRRIGATION AND DEBRIDEMENT KNEE Left 01/12/2017   Procedure: IRRIGATION AND DEBRIDEMENT LEFT KNEE;  Surgeon: [name], MD;  Location: [place];  Service: Orthopedics;  Laterality: Left;  . JOINT REPLACEMENT    . KNEE ARTHROSCOPY Left 08-25-2012  . LOWER EXTREMITY ANGIOGRAPHY  Left 08/25/2016   Procedure: Lower Extremity Angiography;  Surgeon: Wellington Hampshire, MD;  Location: [place] [facility];  Service: Cardiovascular;  Laterality: Left;  . PERIPHERAL VASCULAR BALLOON ANGIOPLASTY Left 08/25/2016   Procedure: PERIPHERAL VASCULAR BALLOON ANGIOPLASTY;  Surgeon: Wellington Hampshire, MD;  Location: [place] [facility];  Service: Cardiovascular;  Laterality: Left;  lt peroneal and ant tibial arteries cutting balloon  . REFRACTIVE SURGERY Bilateral   . STUMP REVISION Right 05/23/2019   Procedure: REVISION OF RIGHT BKA;  Surgeon: [name], MD;  Location: [place];  Service: Orthopedics;  Laterality: Right;  . TOE AMPUTATION Bilateral    "I've lost 7 toes over the last 7 years" (05/09/2014)  . TOE SURGERY Left April 2015   Big toe removed on left foot.  . TONSILLECTOMY    . TOTAL KNEE ARTHROPLASTY Left 04/10/2014   Procedure: TOTAL KNEE ARTHROPLASTY;  Surgeon: [name], MD;  Location: [place];  Service: Orthopedics;  Laterality: Left;  . TOTAL KNEE REVISION  Left 10/25/2014   Procedure: LEFT TOTAL KNEE REVISION;  Surgeon: [name], MD;  Location: [place];  Service: Orthopedics;  Laterality: Left;  . TOTAL KNEE REVISION Left 11/26/2015   Procedure: Removal Left Total Knee Arthroplasty, Hinged Total Knee Arthroplasty;  Surgeon: [name], MD;  Location: [place];  Service: Orthopedics;  Laterality: Left;  . UVULOPALATOPHARYNGOPLASTY, TONSILLECTOMY AND SEPTOPLASTY  ~ 1989  . WOUND DEBRIDEMENT Left 05/09/2014   Dehiscence Left Total Knee Arthroplasty Incision   Family History  Problem Relation Age of Onset  . Hypertension Mother   . Cancer Mother 34       Ovarian  . Heart disease Maternal Aunt   . Stroke Maternal Grandfather    Social History:  reports that he quit smoking about 5 months ago. His smoking use included cigarettes. He has a 3.84 pack-year smoking history. He has never used smokeless tobacco. He reports that he does not drink alcohol and does not use  drugs.  ROS: As per HPI otherwise negative.  Physical Exam: Vitals:   06/12/2019 1527 07/03/2019 1951 06/29/2019 2044 06/26/19 0422  BP: 119/75 118/68 124/78 119/73  Pulse: 65 (!) 59 (!) 58 61  Resp: 18 14 10 14   Temp: 98.3 F (36.8 C)  98.4 F (36.9 C) 98.3 F (36.8 C)  TempSrc: Oral  Oral Oral  SpO2: 95% 96% 94% 90%  Weight:    58.3 kg     General: Chronically ill appearing male, alert and  in no acute distress. Head: Normocephalic, atraumatic, sclera non-icteric, mucus membranes are moist. Neck: Supple without lymphadenopathy/masses. JVD not elevated. Lungs: + crackles bilateral bases. No wheezing auscultated. Respirations unlabored on RA.  Heart: RRR with normal S1, S2. No murmurs, rubs, or gallops appreciated. Abdomen: Soft, non-tender, non-distended with normoactive bowel sounds. No rebound/guarding. No obvious abdominal masses. Lower extremities: B/L AKA, trace edema b/l hips Neuro: + myoclonus. Alert and oriented to person and place. Moves all extremities spontaneously. Psych:  Responds to questions appropriately with a normal affect. Dialysis Access: LUE AVF + thrill/bruit  Allergies  Allergen Reactions  . Morphine And Related Other (See Comments)    hallucinations  . Tygacil [Tigecycline] Nausea And Vomiting and Other (See Comments)  . Imodium [Loperamide] Itching   Prior to Admission medications   Medication Sig Start Date End Date Taking? Authorizing Provider  acetaminophen (TYLENOL) 500 MG tablet Take 500-1,000 mg by mouth every 6 (six) hours as needed for mild pain or fever.     [provider]  Amino Acids-Protein Hydrolys (FEEDING SUPPLEMENT, PRO-STAT SUGAR FREE 64,) LIQD Take 30 mLs by mouth 2 (two) times daily. 01/05/19   Debbe Odea, MD  amLODipine (NORVASC) 5 MG tablet Take 5 mg by mouth every evening.     [provider]  aspirin EC 81 MG EC tablet Take 2 tablets (162 mg total) by mouth daily. Patient taking differently: Take 81 mg by mouth  at bedtime.  01/20/17   Doreatha Lew, MD  atorvastatin (LIPITOR) 40 MG tablet Take 1 tablet (40 mg total) by mouth daily. 06/19/19 09/17/19  Wellington Hampshire, MD  bisacodyl (DULCOLAX) 10 MG suppository Place 1 suppository (10 mg total) rectally daily as needed for moderate constipation. 01/05/19   Debbe Odea, MD  carvedilol (COREG) 12.5 MG tablet Take 1 tablet (12.5 mg total) by mouth 2 (two) times daily with a meal. 02/08/17   Dana Allan I, MD  CREON [phone] units CPEP capsule Take 1-2 capsules by mouth See  admin instructions. Take 2 capsules by mouth with meals and 1 capsule before snacks 03/20/19   [provider]  Darbepoetin Alfa (ARANESP) 150 MCG/0.3ML SOSY injection Inject 0.3 mLs (150 mcg total) into the vein every Friday with hemodialysis. 02/11/17   Bonnell Public, MD  docusate sodium (COLACE) 100 MG capsule Take 1 capsule (100 mg total) by mouth 2 (two) times daily. 01/05/19   Debbe Odea, MD  gabapentin (NEURONTIN) 100 MG capsule Take 200 mg by mouth daily.  09/27/18   [provider]  heparin 1000 UNIT/ML injection Inject 4 mLs into the vein See admin instructions. Every hemodialysis treatment 08/14/18 08/13/19  [provider]  hydrOXYzine (ATARAX/VISTARIL) 50 MG tablet Take 50 mg by mouth 3 (three) times daily as needed for itching.     [provider]  menthol-cetylpyridinium (CEPACOL) 3 MG lozenge Take 1 lozenge (3 mg total) by mouth as needed for sore throat. 01/05/19   Debbe Odea, MD  montelukast (SINGULAIR) 10 MG tablet Take 10 mg by mouth at bedtime as needed (allergies).  08/14/18   [provider]  multivitamin (RENA-VIT) TABS tablet Take 1 tablet by mouth at bedtime. 02/08/17   Dana Allan I, MD  sertraline (ZOLOFT) 100 MG tablet Take 100 mg by mouth daily. 04/18/19   [provider]  temazepam (RESTORIL) 30 MG capsule Take 30 mg by mouth at bedtime.     [provider]   Current  Facility-Administered Medications  Medication Dose Route Frequency Provider Last Rate Last Admin  . amLODipine (NORVASC) tablet 5 mg  5 mg Oral QPM Agyei, Obed K, MD      . aspirin EC tablet 81 mg  81 mg Oral QHS Agyei, Obed K, MD      . atorvastatin (LIPITOR) tablet 40 mg  40 mg Oral Daily Agyei, Obed K, MD      . feeding supplement (PRO-STAT SUGAR FREE 64) liquid 30 mL  30 mL Oral BID Agyei, Obed K, MD      . heparin injection 5,000 Units  5,000 Units Subcutaneous Q8H Jean Rosenthal, MD   5,000 Units at 06/26/19 [phone]  . lipase/protease/amylase (CREON) capsule 36,000 Units  36,000 Units Oral BID PRN Sid Falcon, MD      . lipase/protease/amylase (CREON) capsule 72,000 Units  72,000 Units Oral TID WC Agyei, Obed K, MD      . menthol-cetylpyridinium (CEPACOL) lozenge 3 mg  1 lozenge Oral PRN Agyei, Obed K, MD      . sertraline (ZOLOFT) tablet 100 mg  100 mg Oral Daily Agyei, Obed K, MD      . sodium chloride flush (NS) 0.9 % injection 3 mL  3 mL Intravenous Once Jean Rosenthal, MD       Labs: Basic Metabolic Panel: Recent Labs  Lab 06/18/2019 1633  NA 137  K 4.7  CL 102  CO2 23  GLUCOSE 105*  BUN 65*  CREATININE 8.29*  CALCIUM 8.8*   Liver Function Tests: Recent Labs  Lab 07/07/2019 1633  AST 17  ALT 8  ALKPHOS 106  BILITOT 1.1  PROT 6.9  ALBUMIN 2.3*   CBC: Recent Labs  Lab 06/19/2019 1633  WBC 6.5  HGB 8.8*  HCT 28.3*  MCV 100.7*  PLT 83*   Studies/Results: DG Chest Port 1 View  Result Date: 06/29/2019 CLINICAL DATA:  Altered mental status EXAM: PORTABLE CHEST 1 VIEW COMPARISON:  12/29/2018 FINDINGS: Prior CABG. Cardiomegaly with vascular congestion and probable early interstitial  edema. No effusions or acute bony abnormality. IMPRESSION: Cardiomegaly with vascular congestion and early interstitial edema. Electronically Signed   By: Rolm Baptise M.D.   On: 06/13/2019 21:58    Dialysis Orders:  Center: [place] on MWF. Last HD 06/20/19 MonWedFri, 4 hrs 0 min,  180NRe, BFR 400, DFR Autoflow 2.0, EDW 56kg, Dialysate 3.0 K, 3.0 Ca Heparin 4000 unit bolus Venofer 50mg  per week Mircera 171mcg IVP q 2 weeks- last dose 5/22 (hgb 9.2 on 6/9)  Assessment/Plan: 1.  ESRD:  Dialyzes on MWF schedule, last HD 6/9. Reports missed treatments are due to diarrhea. Patient with myoclonus and volume overload on exam. Will plan for HD today then resume MWF schedule tomorrow.  2.  Hypertension/volume: BP controlled, CXR consistent with fluid overload and crackles on exam. He is 2.3kg over his EDW but may need to be lowered. UFG 3L with HD today.  3.  Anemia: Hgb 8.8. No blood loss reported. Due for ESA, will order aranesp with HD today.  4.  Metabolic bone disease: Corrected calcium 10.2 (low albumin). Not on VDRA. Recent outpatient phos variable, will hold off on binder for now given poor recent poor PO intake and follow phos.   5.  Nutrition:  Alb 2.6. On protein supplements. Renal/carb modified diet. 6. Chronic deconditioning/possible AMS: Patient with multiple recent hospital and SNF admissions, in general very deconditioned and has difficulty caring for himself. Recommend ongoing [place] conversations. 7. Diarrhea: C. Diff reportedly negative at SNF. GI panel pending.    Anice Paganini, PA-C 06/26/2019, 8:23 AM  [facility] Pager: [phone]

## 2019-06-26 NOTE — Evaluation (Signed)
Physical Therapy Evaluation Patient Details Name: Matthew Kline MRN: [id number] DOB: 26-Mar-1960 Today's Date: 06/26/2019   History of Present Illness  Patient is a 59 year old male with past medical history significant for ESRD on HD Monday/Wednesday/Friday, HFrEF, bilateral AKA who presents for evaluation of altered mental status, deconditioning in the setting of recent hospitalization and physical rehab stay.  Patient denies new acute complaint.  Per son, patient has had difficulty caring for himself.   Clinical Impression   Pt admitted with above diagnosis. Comes from home where he is in a single level apt with level entry; Son assists; Noting difficulty managing at home, difficulty getting to HD; Presents to PT with confusion, decr functional mobility, decr independence and safety with mobility and ADLs;  Pt currently with functional limitations due to the deficits listed below (see PT Problem List). Pt will benefit from skilled PT to increase their independence and safety with mobility to allow discharge to the venue listed below.       Follow Up Recommendations SNF    Equipment Recommendations  Wheelchair (measurements PT);Wheelchair cushion (measurements PT)    Recommendations for Other Services       Precautions / Restrictions Precautions Precautions: Fall Other Brace: Noted he had a fall from bed at SNF, and was placed in a C collar; Cervical brace not in the room during session today -- noted he told Josepha Pigg, Medical Student that a physician had told him he no longer needs it; Paged Internal Medicine to discuss      Mobility  Bed Mobility Overal bed mobility: Needs Assistance Bed Mobility: Supine to Sit;Sit to Supine;Rolling Rolling: Min guard   Supine to sit: Min guard Sit to supine: Min guard   General bed mobility comments: Good use of bedrails to roll; pushed up to sit via elbow prop; and went from upright to elbow prop to supine to lay back  down  Transfers Overall transfer level: Needs assistance   Transfers: Lateral/Scoot Transfers          Lateral/Scoot Transfers: Min guard General transfer comment: Simultated lateral scoot transers with scooting in the bed; good use of bil UE push to unwigh hips for scooting  Ambulation/Gait                Stairs            Wheelchair Mobility    Modified Rankin (Stroke Patients Only)       Balance Overall balance assessment: Needs assistance Sitting-balance support: Single extremity supported;Bilateral upper extremity supported;No upper extremity supported Sitting balance-Leahy Scale: Fair (approaching good) Sitting balance - Comments: Sat EOB unsupported to eat some breakfast before HD; see also OT note for other details related to ADLs Postural control:  (Able to sit unsupported at EOB to eat)                                   Pertinent Vitals/Pain Pain Assessment: Faces Faces Pain Scale: Hurts little more Pain Location: [place] with pericare Pain Descriptors / Indicators: Grimacing Pain Intervention(s): Monitored during session    Home Living Family/patient expects to be discharged to:: Private residence Living Arrangements: Children (though unsure how much assist son can provide) Available Help at Discharge: Family Type of Home: Apartment Home Access: Level entry     Home Layout: One level Home Equipment: Walker - 2 wheels;Wheelchair - manual;Tub bench (L LE prosthetic) Additional Comments: information per chart  Prior Function Level of [place]: Needs assistance   Gait / Transfers Assistance Needed: Reports no difficulty with lateral scoot to his wheelchair     Comments: Unsure of reliability as a historian; he indicated no difficulty with transfers, yet, found by son in unkept situation     Hand Dominance   Dominant Hand: Right    Extremity/Trunk Assessment   Upper Extremity Assessment Upper Extremity  Assessment: Defer to OT evaluation    Lower Extremity Assessment Lower Extremity Assessment: RLE deficits/detail;LLE deficits/detail RLE Deficits / Details: AKA; Hip ROM WFL LLE Deficits / Details: AKA; Hip ROM WFL       Communication      Cognition Arousal/Alertness: Awake/alert Behavior During Therapy: WFL for tasks assessed/performed (very distractable) Overall Cognitive Status: Impaired/Different from baseline Area of Impairment: Orientation;Problem solving;Awareness;Safety/judgement                 Orientation Level: Disoriented to;Time;Situation       Safety/Judgement: Decreased awareness of safety;Decreased awareness of deficits Awareness: Intellectual Problem Solving: Slow processing;Decreased initiation;Difficulty sequencing;Requires verbal cues;Requires tactile cues General Comments: Unaware that he was incontinent of bowel      General Comments      Exercises     Assessment/Plan    PT Assessment Patient needs continued PT services  PT Problem List Decreased strength;Decreased range of motion;Decreased activity tolerance;Decreased balance;Decreased mobility;Decreased coordination;Decreased cognition;Decreased knowledge of use of DME;Decreased safety awareness;Decreased knowledge of precautions       PT Treatment Interventions      PT Goals (Current goals can be found in the Care Plan section)  Acute Rehab PT Goals Patient Stated Goal: Did not specifically state; agreeable to sittingup to eat PT Goal Formulation: Patient unable to participate in goal setting Time For Goal Achievement: 07/10/19 Potential to Achieve Goals: Good    Frequency Min 2X/week   Barriers to discharge   Difficulty meeting his needs at home    Co-evaluation PT/OT/SLP Co-Evaluation/Treatment: Yes Reason for Co-Treatment: Necessary to address cognition/behavior during functional activity;To address functional/ADL transfers PT goals addressed during session: Mobility/safety  with mobility         AM-PAC PT "6 Clicks" Mobility  Outcome Measure Help needed turning from your back to your side while in a flat bed without using bedrails?: A Little Help needed moving from lying on your back to sitting on the side of a flat bed without using bedrails?: A Little Help needed moving to and from a bed to a chair (including a wheelchair)?: A Little Help needed standing up from a chair using your arms (e.g., wheelchair or bedside chair)?: Total Help needed to walk in hospital room?: Total Help needed climbing 3-5 steps with a railing? : Total 6 Click Score: 12    End of Session Equipment Utilized During Treatment: Other (comment) (bed pad) Activity Tolerance: Patient tolerated treatment well Patient left: in bed;with call bell/phone within reach;with nursing/sitter in room Nurse Communication: Mobility status;Other (comment) (loose stool in bed) PT Visit Diagnosis: Other abnormalities of gait and mobility (R26.89);Muscle weakness (generalized) (M62.81)    Time: 6789-3810 PT Time Calculation (min) (ACUTE ONLY): 47 min   Charges:   PT Evaluation $PT Eval Moderate Complexity: 1 Mod          Roney [place], [place]  Acute Rehabilitation Services Pager [phone] Office [phone]   [place]ta [place] 06/26/2019, 2:40 PM

## 2019-06-26 NOTE — Progress Notes (Signed)
Patient having increased confusion, refusing medication, trying to get out of bed and stating that 'we are trying to kill him and if he takes his medication, he will die' Called to son to speak with patient, advised son that he could come and sit with patient. Son arrived and patient has calmed.

## 2019-06-26 NOTE — Progress Notes (Signed)
Subjective: Seen at bedside today. Pt is responsive to some questions but frequently inattentive or somnolent while speaking. Denies fevers, chills, CP, SOB, N/V, blood in stool, and melena. Reports diarrhea, last BM last night.  Objective:  Vital signs in last 24 hours: Vitals:   07/05/2019 1951 06/24/2019 2044 06/26/19 0422 06/26/19 0840  BP: 118/68 124/78 119/73 126/76  Pulse: (!) 59 (!) 58 61 (!) 58  Resp: 14 10 14 18   Temp:  98.4 F (36.9 C) 98.3 F (36.8 C) (!) 97.3 F (36.3 C)  TempSrc:  Oral Oral Oral  SpO2: 96% 94% 90% 93%  Weight:   58.3 kg    General: Chronically ill-appearing in NAD HEENT: [place]/AT. Grossly EOMI. Not wearing neck brace Cardiac: RRR, no murmurs, rubs or gallops Respiratory: Lungs are CTA BL. No wheezes, rales, or rhonchi Abdominal: Abdomen is soft and non-tender with normoactive bowel sounds MSK: BL lower extremity above the knee amputations. Spontaneously moving arms Neuro: Transiently alert, appeared mildly somnolent. Pt can state his name, the city he is in, and the current year but not his location (hospital) or current month Skin: warm and dry. Few scattered ecchymoses noted on BL arms. No erythema, edema, or pus present on BL lower extremity surgical sites  BMP Latest Ref Rng & Units 07/01/2019 05/30/2019 05/29/2019  Glucose 70 - 99 mg/dL 105(H) 92 103(H)  BUN 6 - 20 mg/dL 65(H) 40(H) 22(H)  Creatinine 0.61 - 1.24 mg/dL 8.29(H) 3.71(H) 2.55(H)  BUN/Creat Ratio 9 - 20 - - -  Sodium 135 - 145 mmol/L 137 134(L) 137  Potassium 3.5 - 5.1 mmol/L 4.7 4.0 3.9  Chloride 98 - 111 mmol/L 102 100 101  CO2 22 - 32 mmol/L 23 26 27   Calcium 8.9 - 10.3 mg/dL 8.8(L) 8.4(L) 8.4(L)   Assessment/Plan:  Principal Problem:   Acute encephalopathy Active Problems:   Essential hypertension   Type II diabetes mellitus (HCC)   ESRD on hemodialysis (HCC)  Pt is a 59-yr-old man with PMH of ESRD, BL lower extremity AKA, recent SNF discharge, pancreatic insufficiency, HTN,  and T2DM who presented to the hospital yesterday with AMS and diarrhea. Last BUN, Cr, and glucose elevated. AMS likely secondary to metabolic abnormalities.  Delirium likely 2/2 ESRD: On exam, pt was transiently alert and appeared somnolent at times, consistent with delirium. Pt has several metabolic abnormalities, including a BUN of 65, Cr of 8.29, and glucose of 105, that are likely contributing to pt's AMS. AMS secondary to temazepam use is also possible. Pt is afebrile, and WBC is normal at 6.5, making an infectious etiology unlikely. Last HD session was on 06/20/2019. -Resume HD as able -Hold temazepam -Trend CBC daily  Non-bloody diarrhea: Per previous SNF, pt had diarrhea prior to his discharge from the facility. Diarrhea possibly secondary to C diff given pt's previous hospitalization and stay at SNF. ESRD is another possible etiology of diarrhea given missed HD sessions. C diff quick screen and GI panel pending. -Follow up C diff quick screen -Follow up GI panel  Missed HD sessions ESRD Chronic anemia: Last HD session was on 6/9. Pt attempted to do dialysis yesterday but could not complete it due to diarrhea. Nephrology has seen the pt and will resume HD as able. Pt would like to resume HD once diarrhea is resolved. Regular HD schedule is MWF. Hgb 8.8 and MCV 100.7, likely secondary to ESRD. -Resume HD as able  Fall with C3 fracture: Pt had a fall while still a SNF resident  that resulted in a C3 fracture and required a neck brace. Pt was not wearing a neck brace in the room. He states that a doctor previously told him that the neck brace was unnecessary. Pt denies needing neck surgery after fall.  Pancreatic insufficiency: -Creon 70,000 units PO TID with meals  HTN: -Amlodipine 5 mg PO QD  HLD: -Atorvastatin 40 mg PO QD  FEN/GI: -Feeding supplement liquid 30 mL PO BID  DVT prophylaxis: -Heparin injection 5000 units SQ  CODE STATUS: FULL  Prior to Admission Living  Arrangement: Home Anticipated Discharge Location: son's home in [place] Barriers to Discharge: delirium Dispo: Anticipated discharge in greater than 2 days  Josepha Pigg, Medical Student 06/26/2019, 10:17 AM Pager: [phone] After 5pm on weekdays and 1pm on weekends: On Call pager [phone]

## 2019-06-26 NOTE — Progress Notes (Addendum)
Update: Pt's son Matthew Kline was called and given an update on the pt's status. Mr. Matthew Kline stated that he was concerned with pt dispo because he does not have the resources to take care of his father and is going to [place] soon. He would like his father to be placed in a nursing facility close to his home in [place] and worries he won't be able to keep an eye on the pt's health if pt is discharged to a facility in [place]. Advised son that [facility] can address facility options related to his disposition concerns, but discharges across state lines are difficult/time consuming and the pt will likely need placement locally before any move or transfer to [place].   Of note, Mr. Matthew Kline also confirmed that pt had a fall and was placed in a C collar due to C3 fracture. He informed us that pt repeatedly took the collar off due to discomfort. Per son, pt is scheduled to follow up with neurosurgery this Thursday, 07/01/2019, about his neck injury.  Josepha Pigg, Medical Student 06/26/2019, 2:55 PM Pager: [phone] After 5pm on weekdays and 1pm on weekends: On Call pager [phone]  Attestation for Student Documentation:  I personally was present for the medical decision-making activities of this service and have verified that the service and findings are accurately documented in the student's note.  Ladona Horns, MD 06/26/2019, 3:53 PM

## 2019-06-26 NOTE — Progress Notes (Signed)
Patient found on the floor by staff member lying on the right side.  As per patient he thought he was in the car and was trying to remove the door and that's when he slid down through the two rails.  He denies any pain, no open areas or bruises noted and denies hitting his head to the floor.  VS WNL. Notified on call IM MD, no new orders given. Notified Son Ezeriah Luty) and answered all his questions, appreciated for calling him.  Ordered low bed, Will continue to monitor him closely.

## 2019-06-26 NOTE — Progress Notes (Signed)
Orthopedic Tech Progress Note Patient Details:  Matthew Kline Feb 14, 1960 [id number] Called in order to [facility] for an [name] [name], male   DOB: October 04, 1960, 59 y.o.   MRN: [id number]   [name] 06/26/2019, 6:12 PM

## 2019-06-27 ENCOUNTER — Inpatient Hospital Stay (HOSPITAL_COMMUNITY): Payer: Medicare HMO

## 2019-06-27 DIAGNOSIS — L89152 Pressure ulcer of sacral region, stage 2: Secondary | ICD-10-CM | POA: Diagnosis present

## 2019-06-27 DIAGNOSIS — Z681 Body mass index (BMI) 19 or less, adult: Secondary | ICD-10-CM

## 2019-06-27 LAB — CBC
HCT: 31.5 % — ABNORMAL LOW (ref 39.0–52.0)
Hemoglobin: 10 g/dL — ABNORMAL LOW (ref 13.0–17.0)
MCH: 31.1 pg (ref 26.0–34.0)
MCHC: 31.7 g/dL (ref 30.0–36.0)
MCV: 97.8 fL (ref 80.0–100.0)
Platelets: 105 10*3/uL — ABNORMAL LOW (ref 150–400)
RBC: 3.22 MIL/uL — ABNORMAL LOW (ref 4.22–5.81)
RDW: 16.3 % — ABNORMAL HIGH (ref 11.5–15.5)
WBC: 5.2 10*3/uL (ref 4.0–10.5)
nRBC: 0 % (ref 0.0–0.2)

## 2019-06-27 LAB — TECHNOLOGIST SMEAR REVIEW: Plt Morphology: NORMAL

## 2019-06-27 LAB — VITAMIN B12: Vitamin B-12: 741 pg/mL (ref 180–914)

## 2019-06-27 LAB — GLUCOSE, CAPILLARY
Glucose-Capillary: 168 mg/dL — ABNORMAL HIGH (ref 70–99)
Glucose-Capillary: <10 mg/dL — CL (ref 70–99)

## 2019-06-27 MED ORDER — DEXTROSE 50 % IV SOLN
INTRAVENOUS | Status: AC
  Filled 2019-06-27: qty 50

## 2019-06-27 MED ORDER — ACETAMINOPHEN 325 MG PO TABS: 650.0000 mg | ORAL_TABLET | Freq: Four times a day (QID) | ORAL | Status: DC | PRN

## 2019-06-27 MED ORDER — SERTRALINE HCL 50 MG PO TABS: 150.0000 mg | ORAL_TABLET | Freq: Every day | ORAL | Status: DC

## 2019-06-27 MED ORDER — TEMAZEPAM 15 MG PO CAPS
30.0000 mg | ORAL_CAPSULE | Freq: Every day | ORAL | Status: DC
  Administered 2019-06-27: 30 mg via ORAL
  Filled 2019-06-27: qty 2

## 2019-06-27 MED ORDER — DEXTROSE 50 % IV SOLN
INTRAVENOUS | Status: AC
  Administered 2019-06-27: 50 mL
  Filled 2019-06-27: qty 50

## 2019-06-28 DIAGNOSIS — I469 Cardiac arrest, cause unspecified: Secondary | ICD-10-CM | POA: Diagnosis not present

## 2019-06-28 DIAGNOSIS — E162 Hypoglycemia, unspecified: Secondary | ICD-10-CM

## 2019-06-28 NOTE — Progress Notes (Signed)
Spoke with Colletta Maryland at [facility], reference number [id number].

## 2019-06-28 NOTE — Progress Notes (Signed)
Paged by RN to inform me patient died. Called patients son, Abdulrahman Bracey. He traveled back to [place] tonight to see his wife and children. He was made aware the medical examiner has been notified and will discuss option of autopsy with him.

## 2019-06-28 NOTE — Progress Notes (Signed)
RN went into the room to check on patient. RN found the patient to be bradycardic with shallow breathes. RN check patients CBG and found it to be less than 10. While NT was taking patient's v/s, patient became breathless and pulseless. On call MD Court Joy) paged. [facility] contacted. Patient not a candidate for donation. Talked with Colletta Maryland, reference [phone].

## 2019-06-29 LAB — PATHOLOGIST SMEAR REVIEW

## 2019-06-29 LAB — GLUCOSE, CAPILLARY: Glucose-Capillary: <10 mg/dL — CL (ref 70–99)

## 2019-07-10 ENCOUNTER — Ambulatory Visit: Payer: Medicare HMO | Admitting: Family

## 2019-07-12 NOTE — Progress Notes (Addendum)
   2019/07/26 0600  What Happened  Was fall witnessed? No  Was patient injured? No  Patient found other (Comment) (on floor mats)  Found by Staff-comment  Stated prior activity other (comment) (pt laying in bed)  Follow Up  MD notified Benjamine Mola  Time MD notified [phone]  Family notified Yes - comment (Message left for son Benson Porcaro)  Time family notified [phone]  Additional tests No    Patient bed alarm went off. NT found patient laying on the floor mats bedside his low bed. RN went into the room assess patient, found no bruises or wounds. Patient did not have any c/o pain or discomfort. NT took patient v/s. Patient placed in a chair with chair alarm and brought to the nurses station.

## 2019-07-12 NOTE — Progress Notes (Signed)
[facility] Progress Note  Subjective: mult c/o's, nonphysical  Vitals:   24-Jul-2019 0930 2019-07-24 1000 07-24-2019 1030 July 24, 2019 1054  BP: (!) 155/85 (!) 155/88 (!) 146/57 (!) 157/82  Pulse: 75 77 78 77  Resp:    19  Temp:      TempSrc:    Oral  SpO2:    96%  Weight:        Exam:   alert, nad   no jvd  Chest cta bilat  Cor reg no RG  Abd soft ntnd no ascites   Ext bilat LE amp, no edema   Alert, NF, ox3   LUE AVF+ bruit     OP HD: [facility] MWF   4h  400/2.0  56kg  3K/3Ca bath  Hep 4000    - venofer 50 mg / wk  - mircera 150 ug every 2 wks, last 5/22   Assessment/ Plan: 1. ESRD - on HD MWF. Having problems w/ missing sessions due to GI issues. Was prob uremic on admit and is better , HD again today to get back on schedule.    2. EOL - had d/w patient today, he realizes his physical decline is progressive and doesn't want to suffer needlessly, but would like to live as long as he can to "spend time w/ my grand-kids" at home. He doesn't want heroic measures and agrees to DNR order.  He does not want to go to a nursing home.  If his care is to complex to be done from home, he would want to transfer to hospice care w/ HD withdrawal.  He would consent to either hospice at home or at a hospice facility.  He understands the typical course following HD withdrawal.  I would recommend dc to home for now w/ the understanding that he might not do well.  3. HTN/ vol - at dry wt today, no vol excess 4. Anemia ckd - Hb 8-9 range, got esa yest darbe 100 ug on 6/15 5. PAD bilat LE amp 6. AMS - resolved w/ HD 7. Diarrhea - not severe per pt. Per pmd.  8. DNR - full DNR per pt request today     Rob Carlynn Leduc 24-Jul-2019, 11:01 AM   Recent Labs  Lab 06/26/2019 1633 06/26/19 1230  K 4.7 4.7  BUN 65* 72*  CREATININE 8.29* 8.94*  CALCIUM 8.8* 8.6*  PHOS  --  6.5*  HGB 8.8* 8.9*   Inpatient medications: . amLODipine  5 mg Oral QPM  . aspirin EC  81 mg Oral QHS  . atorvastatin   40 mg Oral Daily  . Chlorhexidine Gluconate Cloth  6 each Topical Q0600  . Chlorhexidine Gluconate Cloth  6 each Topical Q0600  . feeding supplement (PRO-STAT SUGAR FREE 64)  30 mL Oral BID  . heparin  5,000 Units Subcutaneous Q8H  . lipase/protease/amylase  72,000 Units Oral TID WC  . sertraline  100 mg Oral Daily  . sodium chloride flush  3 mL Intravenous Once    Gerhardt's butt cream, lipase/protease/amylase, menthol-cetylpyridinium

## 2019-07-12 NOTE — Death Summary Note (Signed)
°  Name: Matthew Kline MRN: [id number] DOB: 23-Oct-1960 59 y.o.  Date of Admission: 06/30/2019  3:43 PM Date of Discharge: 06/22/2019 Attending Physician: Dr. Heber Hibbing  Discharge Diagnosis: Principal Problem:   Acute encephalopathy Active Problems:   Essential hypertension   Type II diabetes mellitus ([place])   ESRD on hemodialysis ([place])   Uremia   Adult BMI <19 kg/sq m   Pressure ulcer of sacral region, stage 2 (HCC)   Hypoglycemia   Asystole (HCC)   Cause of death: asystolic cardiac arrest Time of death: 07/13/19. 04/19/2343  Disposition and follow-up:   Mr.Matthew Kline was discharged from [facility] in expired condition.    Hospital Course: 59 yo male with HD dependent ESRD, hypertension, HLD, HFpEF, CAD s/pCABG, depression and atherosclerotic vascular disease with recurrent CVAs/TIAs and bilateral AKA(left 18-Apr-2017; right 04-19-2018).   He had been admitted several times over the past year for issues including cholecystitis and right BKA that eventually required AKA (05/2019) due to infection. His most recent admission was on 06/01/19 after falling out of bed and was found to have a mildly displaced C3 fracture for which he was placed in an Aspen collar and instructed to follow up with neurosurgery.  He presented to [facility] on  for altered mental status after having missed several HD sessions. He underwent HD on 6/15 and mental status seemingly improved when seen on 2022/07/13.  Pt had, however, been experiencing an overall decline in his health recently, likely due to his numerous comorbidities. Nephrology discussed goals of care with him on Jul 13, 2022 at which time he relayed wishes consistent with DNR/DNI and had decided to discharge to home hospice.   On the night of 07-13-2022, he was noted to be unresponsive and CBG was noted to be <10 at that time. He received an amp of d50 after which time his glucose recovered (150). Chart review indicates that later in the night, RN returned to  check on him and he was found to be bradycardic with shallowing breathing.  As VS were being obtained, he developed respiratory and cardiac arrest and subsequently passed away. IMTS was notified and spoke with pt's son who had been returning to [place].   Signed: Mitzi Hansen, MD 06/13/2019, 7:16 AM

## 2019-07-12 NOTE — Progress Notes (Signed)
Subjective: Pt seen at bedside this morning. Pt is very tearful. He wants to spend time with his grandchildren but states he feels isolated from his family due to his condition. He feels that he has lost trust in the medical professionals taking care of him. Reports bruising on the BL arms and depressed mood. Denies diarrhea and head trauma.  Objective:  Vital signs in last 24 hours: Vitals:   Jul 14, 2019 0800 07-14-2019 0830 2019/07/14 0900 Jul 14, 2019 0930  BP: (!) 148/88 (!) 146/90 (!) 147/87 (!) 155/85  Pulse: 78 76 78 75  Resp:      Temp:      TempSrc:      SpO2:      Weight:       General: Chronically ill-appearing in moderate acute distress HEENT: Appears [place]/AT. Grossly EOMI. Wearing neck brace Cardiac: RRR, no murmurs, rubs or gallops Respiratory: The chest wall is symmetric without deformity. Respiratory effort is normal. Abdominal: Abdomen is soft and non-tender with normoactive bowel sounds MSK: BL lower extremity above the knee amputations. Spontaneously moving arms Neuro: Alert. Pt can state his name, the city he is in, and his location (hospital). Able to recognize an object (bag) and do quick math Skin: Few scattered ecchymoses noted on BL arms. No erythema, edema, or pus present on BL lower extremity surgical sites Psych: emotionally labile and tearful on exam  Assessment/Plan:  Principal Problem:   Acute encephalopathy Active Problems:   Essential hypertension   Type II diabetes mellitus ([place])   ESRD on hemodialysis ([place])   Uremia  Pt is a 59-yr-old man with PMH of ESRD, BL AKA, recent SNF discharge, pancreatic insufficiency, HTN, and T2DM whose AMS may have metabolic and psychiatric contributors and whose diarrhea has resolved.  Acute encephalopathy likely 2/2 metabolic abnormalities, possible psych component: On exam, pt was alert and oriented to self, place, and situation but emotionally labile. AMS has persisted despite dialysis and withholding temazepam. Pt is  afebrile, and WBC is normal at 6.5, making an infectious etiology unlikely. CT scan of the head and neck ordered to rule out structural causes of acute encephalopathy. Psychiatric distress another potential etiology of altered mental state. -Follow up CT head and neck -Hold temazepam -Trend CBC daily  Non-bloody diarrhea: Resolved. GI panel negative. C diff quick screen pending. -Follow up C diff quick screen  Depressed mood: Pt emotionally labile on exam and states that he feels isolated from his family. Taking Zoloft 100 mg PO QD outpatient. May be contributing to acute encephalopathy or a consequence of it. Pharmacy contacted about Zoloft prescription and dosage -Increase Zoloft dose per pharmacy recommendations  Missed HD sessions ESRD Chronic anemia: Pt sent to HD today. Regular HD schedule is MWF. Anemia likely secondary to ESRD given last MCV of 100. However, given mildly elevated previous MCV and acute encephalopathy, ordering vitamin B12 to assess for vitamin B12 deficiency.  -Continue HD -Check vitamin B12  Fall with C3 fracture: Pt was wearing a C collar in the room when we saw him today due to previous C3 fracture at SNF. Pt fell from his bed last night because he believed he was in a car and tried opening the door. Concerning for head trauma, though pt denies head injury, pain, or bruising. CT scan of the head and neck ordered to assess for brain trauma. -Follow up CT scan of head and neck  Thrombocytopenia: Platelets dropped from 83 to 76. Concerning for bleeding. Mild bruising seen on BL arms. -Check peripheral  smear  Pancreatic insufficiency: -Creon 70,000 units PO TID with meals  HTN: -Amlodipine 5 mg PO QD  HLD: -Atorvastatin 40 mg PO QD  FEN/GI: -Feeding supplement liquid 30 mL PO BID  DVT prophylaxis: -Heparin injection 5000 units SQ  CODE STATUS: FULL  Prior to Admission Living Arrangement: Home Anticipated Discharge Location: SNF Barriers to  Discharge: delirium and son's residence in [place]. Per son, pt is unable to take care of himself. Pt's son does not have the resources to watch him 24/7, but he would like for his father to live nearby so he can check up on him. Dispo: Anticipated discharge in greater than 2 days  Josepha Pigg, Medical Student 15-Jul-2019, 10:12 AM Pager: [phone] After 5pm on weekdays and 1pm on weekends: On Call pager [phone]

## 2019-07-12 NOTE — Progress Notes (Signed)
Patient was not diaphoretic and responding to verbal or painful stimuli. Checked CBG-<10. An amp of D50 given, recheck CBG-150. On call provider [facility] paged. Q4H CBG ordered. RN received and implemented orders. Will continue to closely monitor.   Rushie Goltz, RN

## 2019-07-12 NOTE — Progress Notes (Addendum)
Patient's son [name] request to speak with Case management and Social worker in regards to discharge planning. Scott plans to find facility in [place] so patient will be closer to him. Dorthey Sawyer, RN

## 2019-07-12 DEATH — deceased

## 2019-10-23 ENCOUNTER — Ambulatory Visit: Payer: Medicare HMO | Admitting: Cardiovascular Disease

## 2020-05-26 IMAGING — DX DG CHEST 2V
2 series · 2 of 2 positions shown · non-contrast
Comparison: Chest x-ray of 07/09/2018

CLINICAL DATA: Left-sided chest pain, sore throat and loss of taste
on [REDACTED].

EXAM:
CHEST - 2 VIEW

[chest pa]
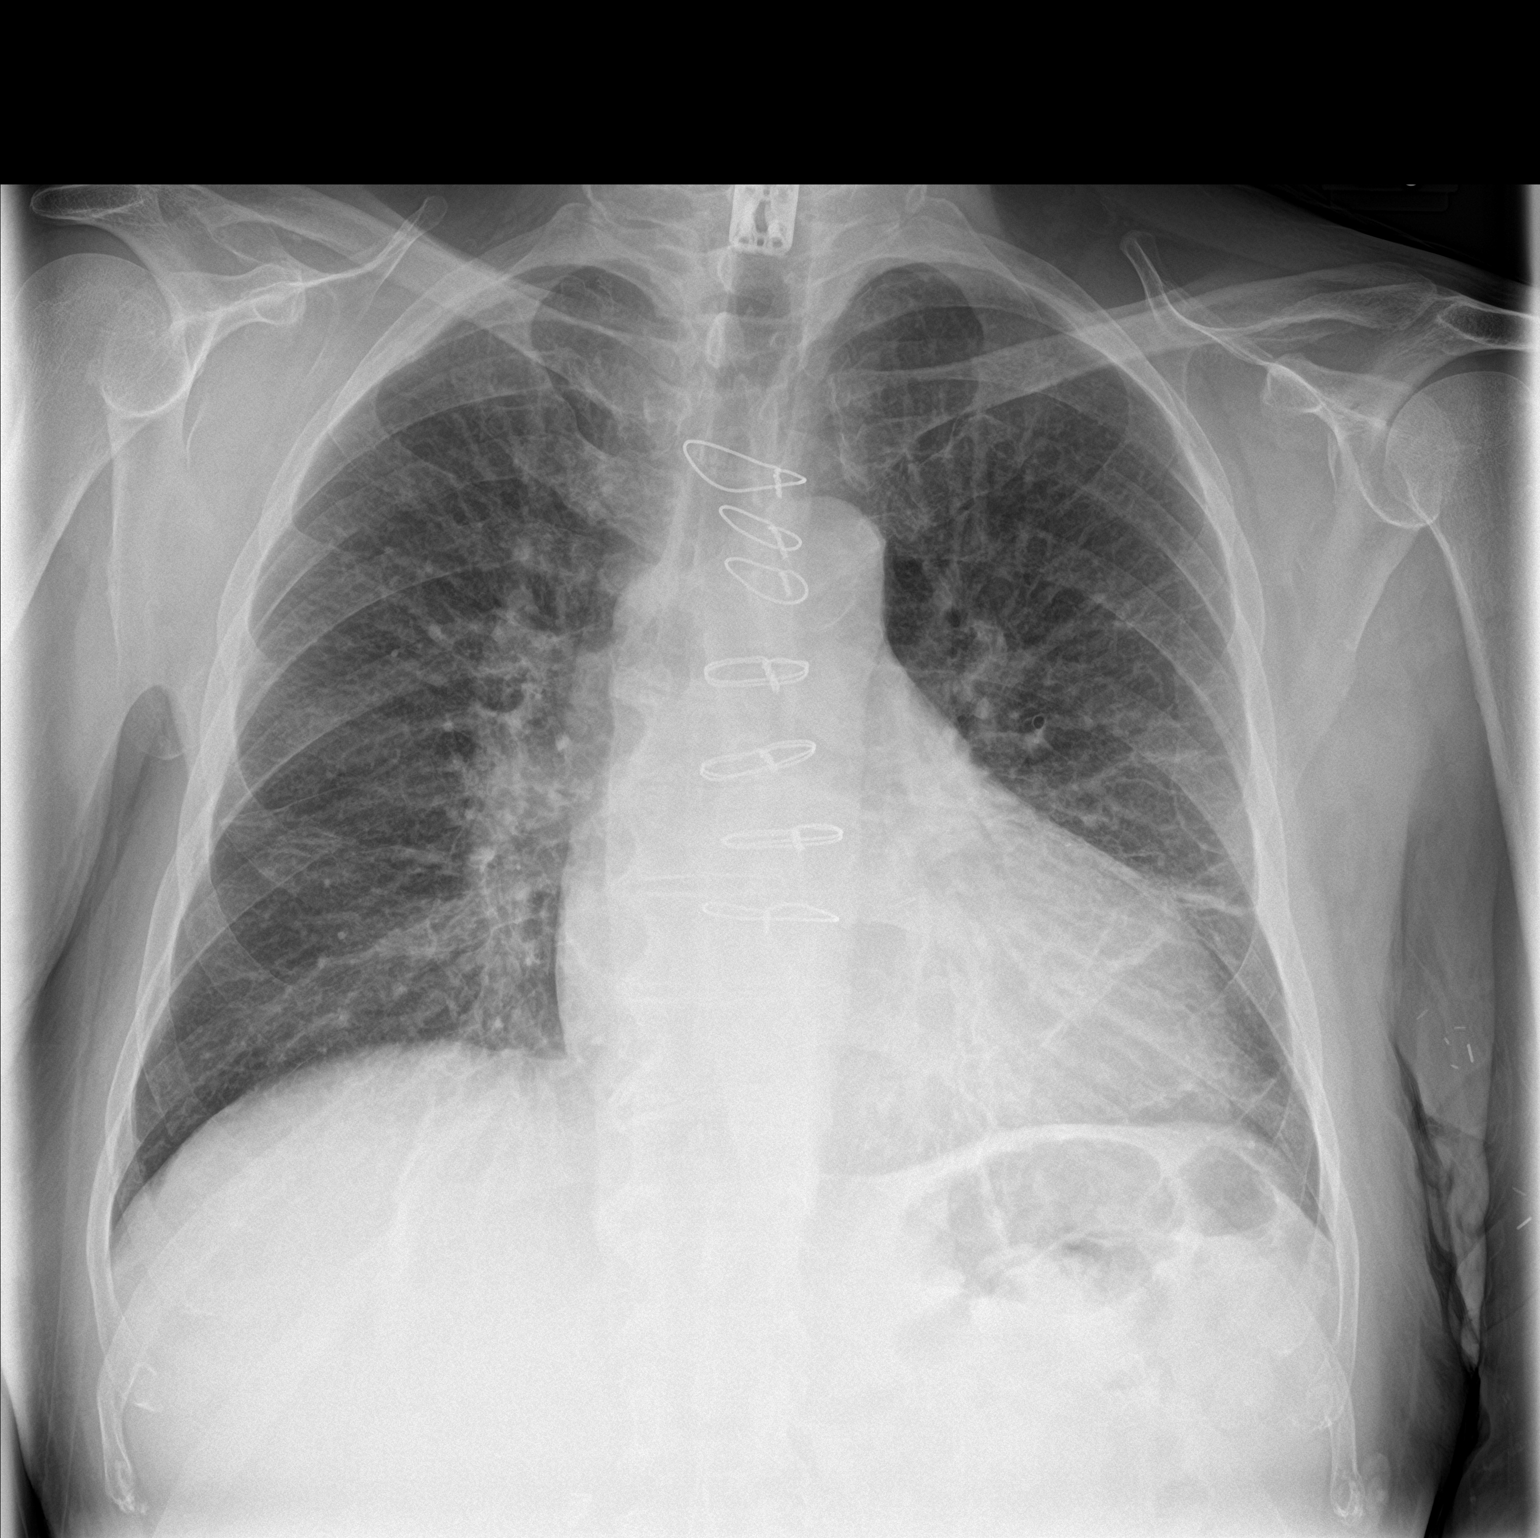

[chest lat]
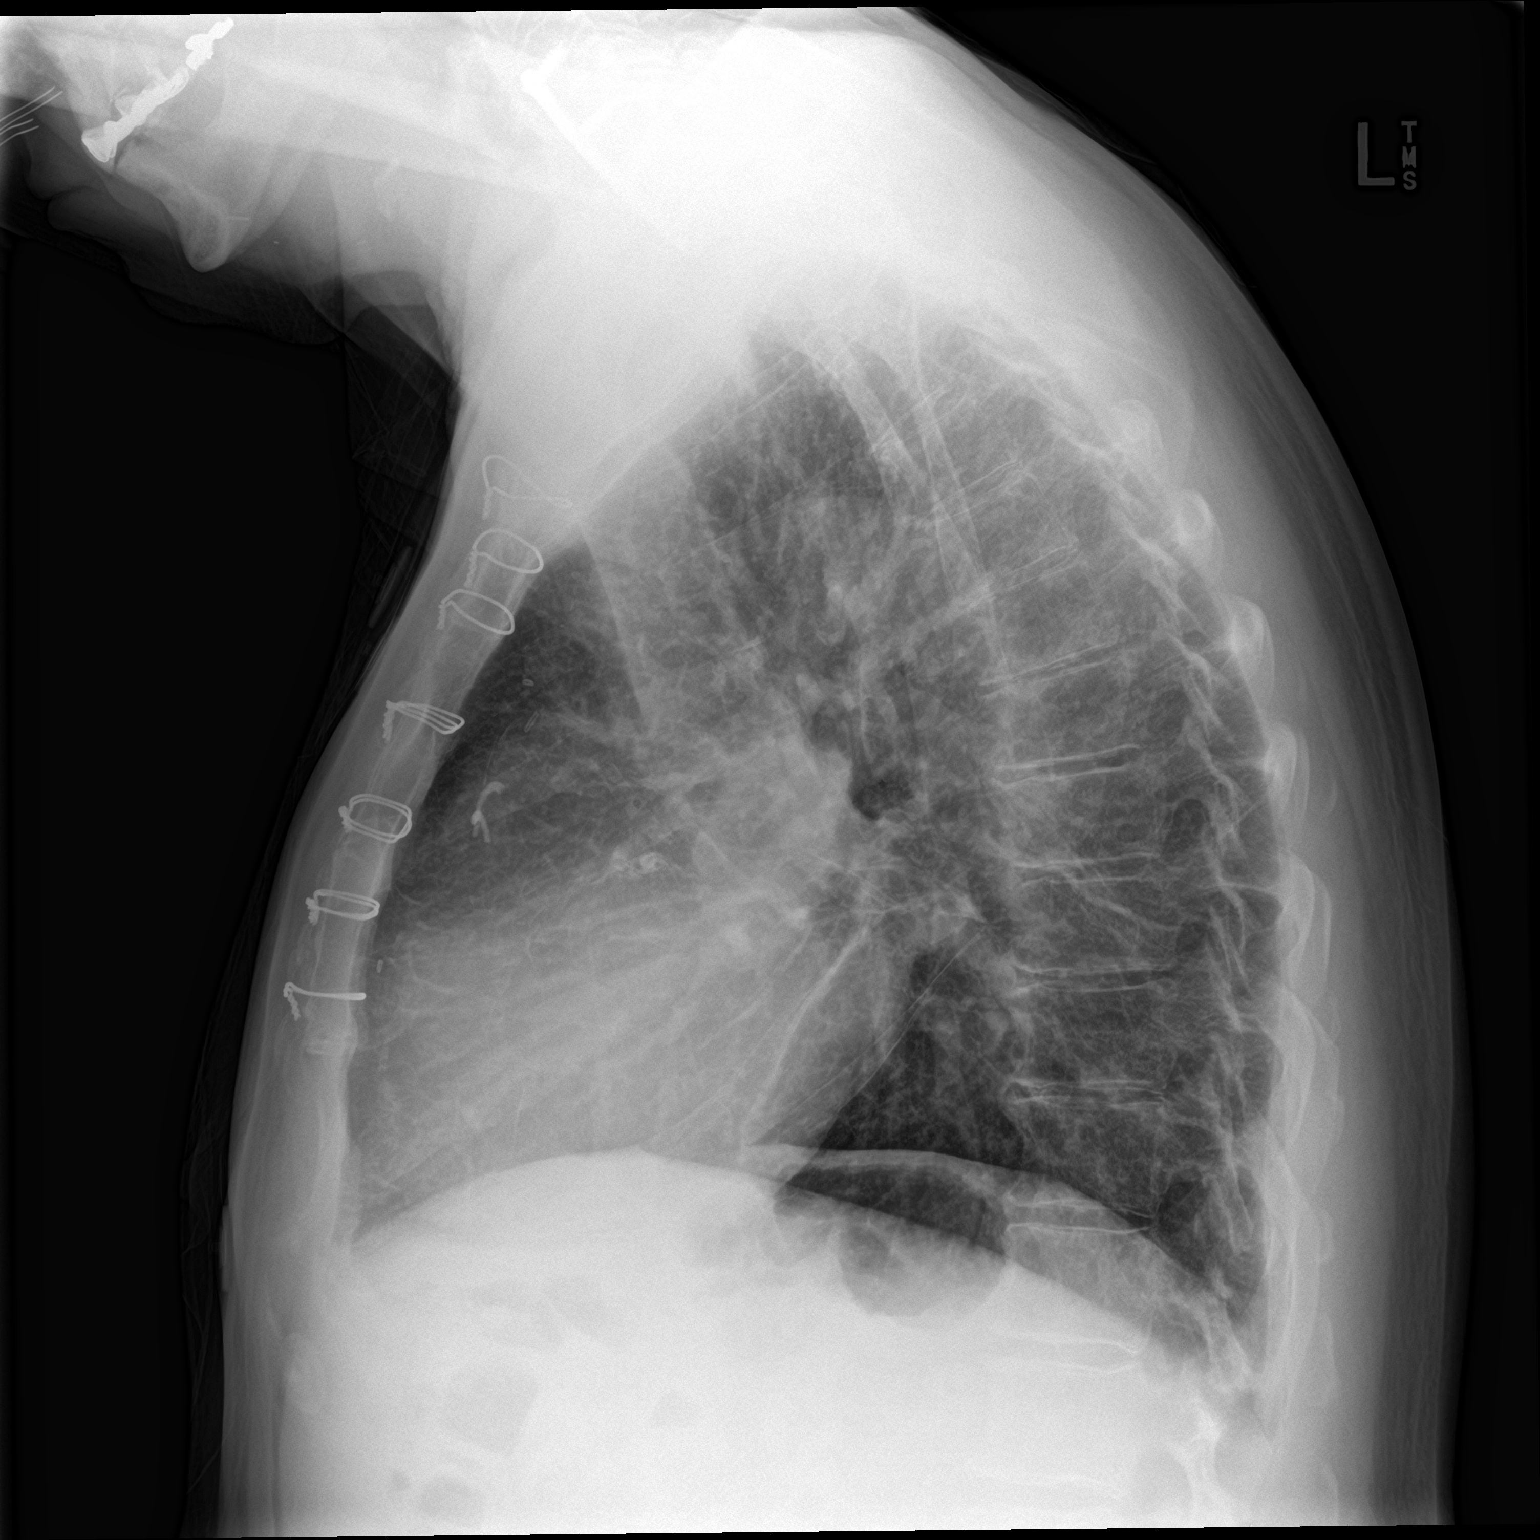

[2 of 2 positions shown; findings below may reference images not displayed]

FINDINGS: Minimal atelectasis at the lung bases. Heart size enlarged as before
with changes of median sternotomy. Central pulmonary vasculature is
engorged. No signs of pleural effusion. No frank edema.

No acute bone finding with changes of cervical discectomy infusion
incidentally noted.

Vascular calcifications along the left neck.
IMPRESSION: Atelectasis and cardiomegaly, no acute cardiopulmonary disease.

## 2020-06-29 IMAGING — US US SCROTUM
1 series · 14 of 25 positions shown · non-contrast
Comparison: None.

CLINICAL DATA: Right testicular swelling and pain for 2 days.
Recent inguinal hernia repair with mesh.

EXAM:
ULTRASOUND OF SCROTUM
TECHNIQUE: Complete ultrasound examination of the testicles, epididymis, and
other scrotal structures was performed.

[Series 1: us scrotum · 14 of 64 slices shown]
[im 1/64]
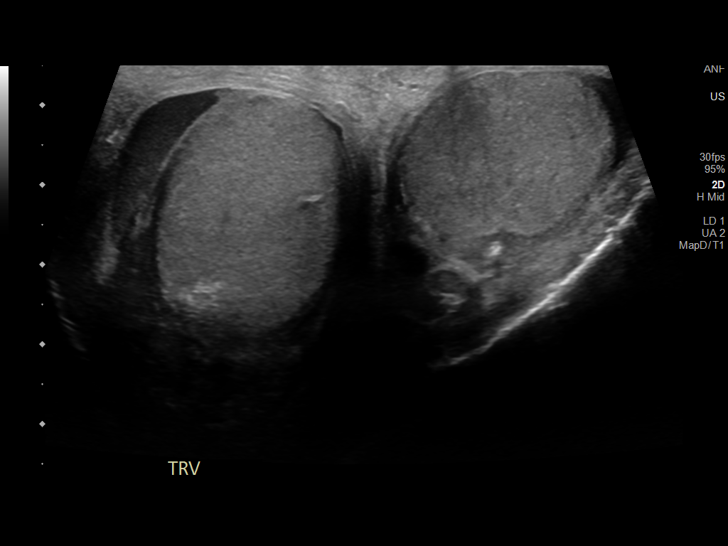
[im 6/64]
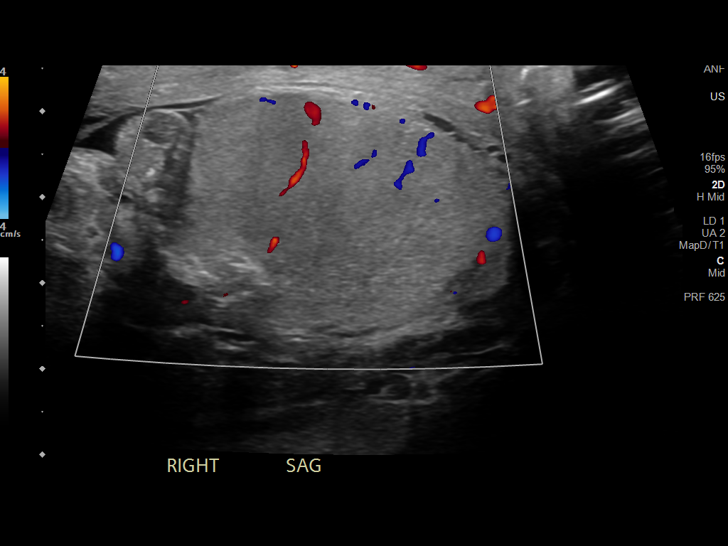
[im 11/64]
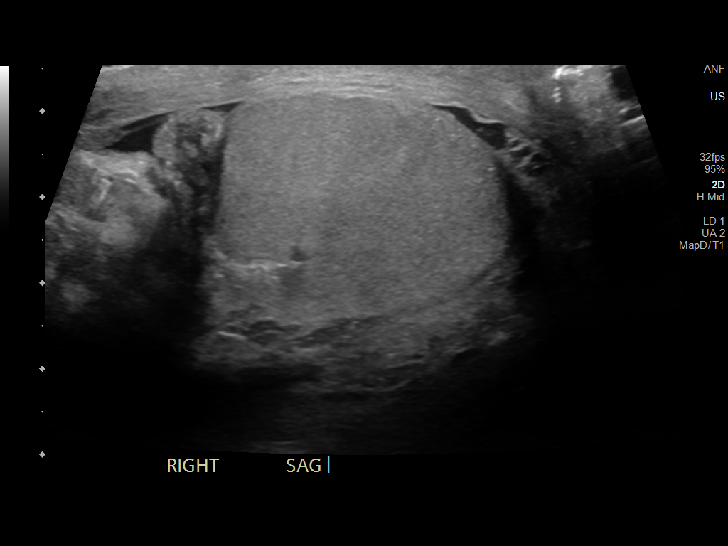
[im 16/64]
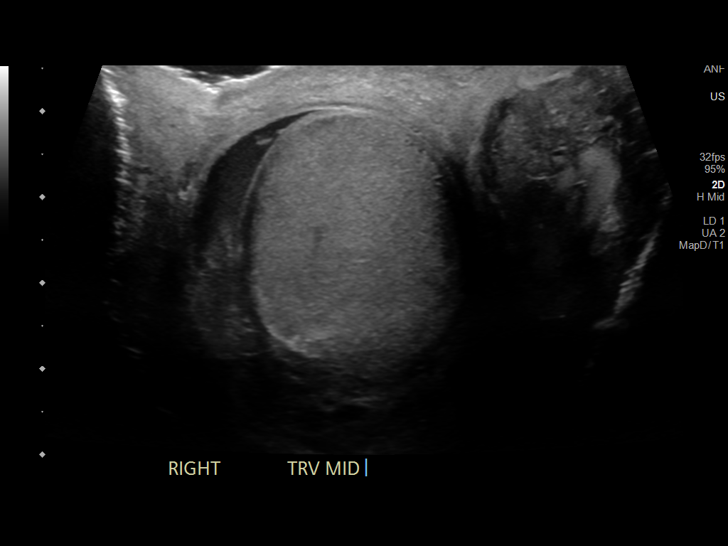
[im 22/64]
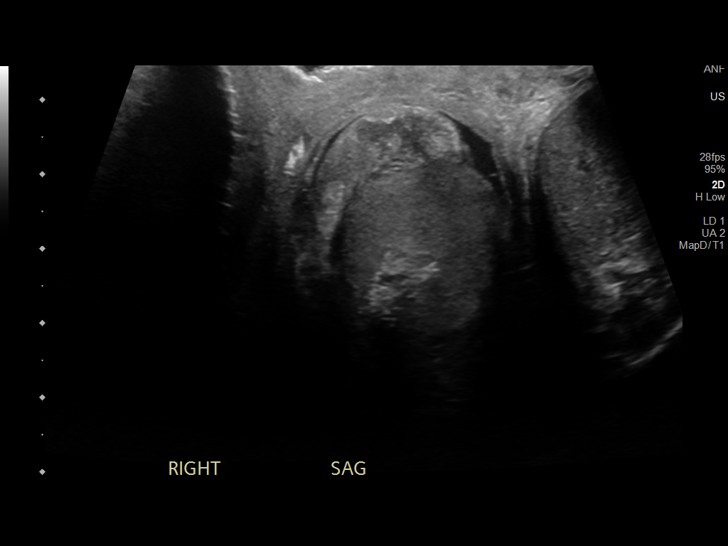
[im 24/64]
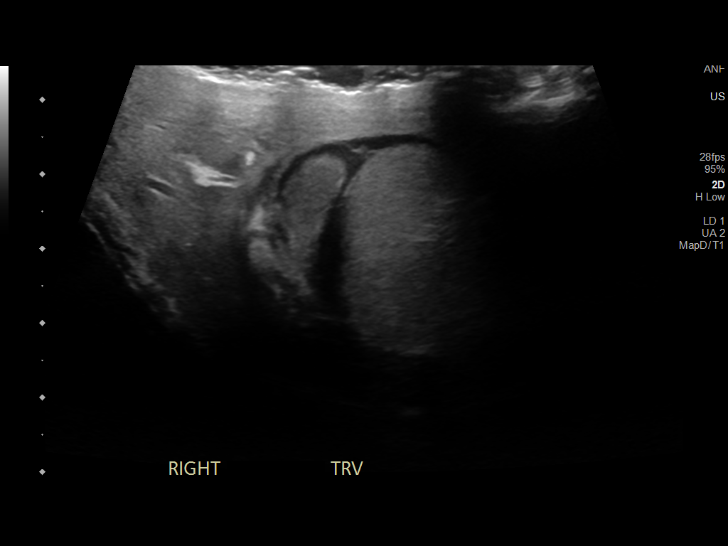
[im 29/64]
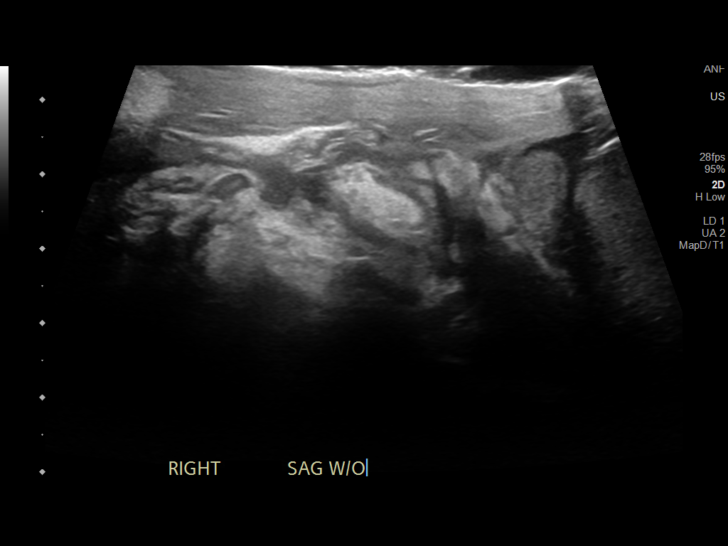
[im 35/64]
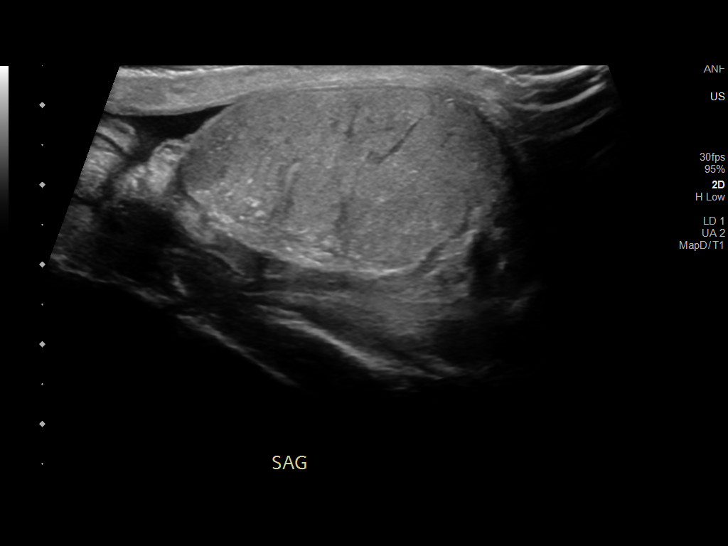
[im 40/64]
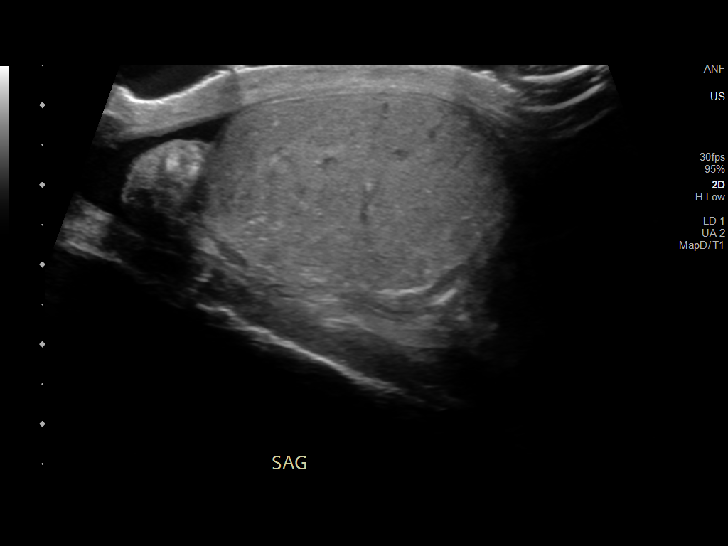
[im 43/64]
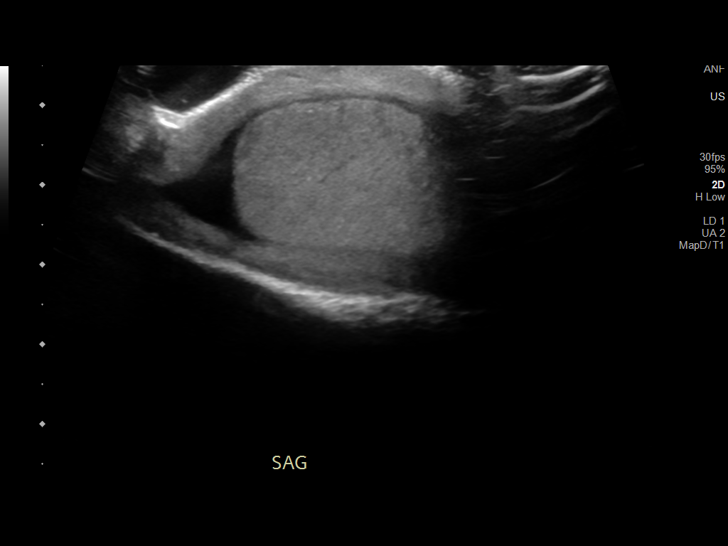
[im 48/64]
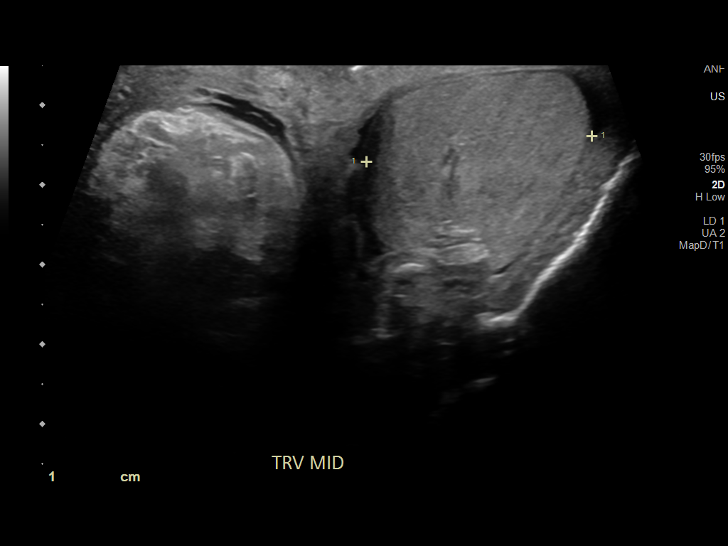
[im 53/64]
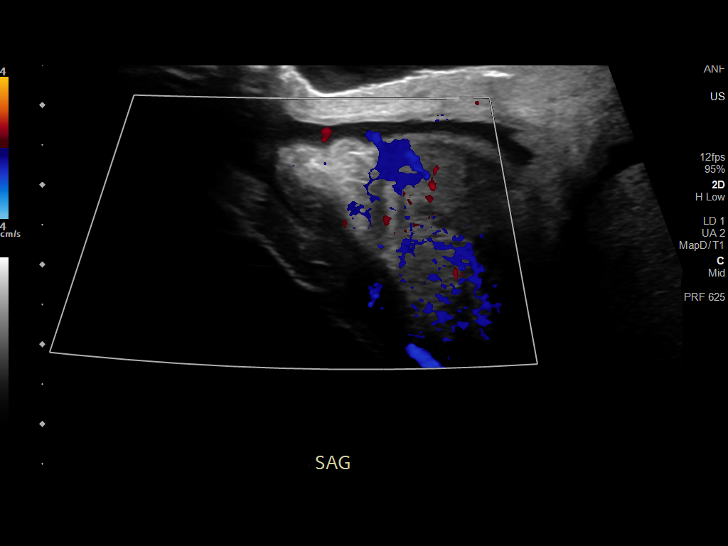
[im 58/64]
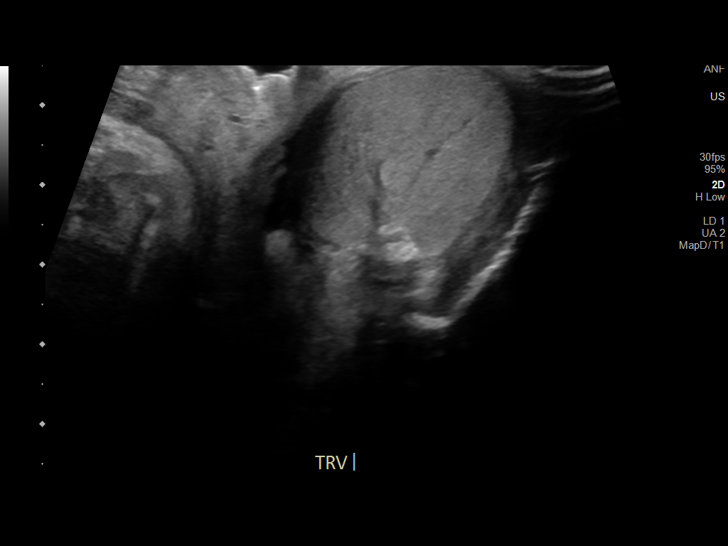
[im 64/64]
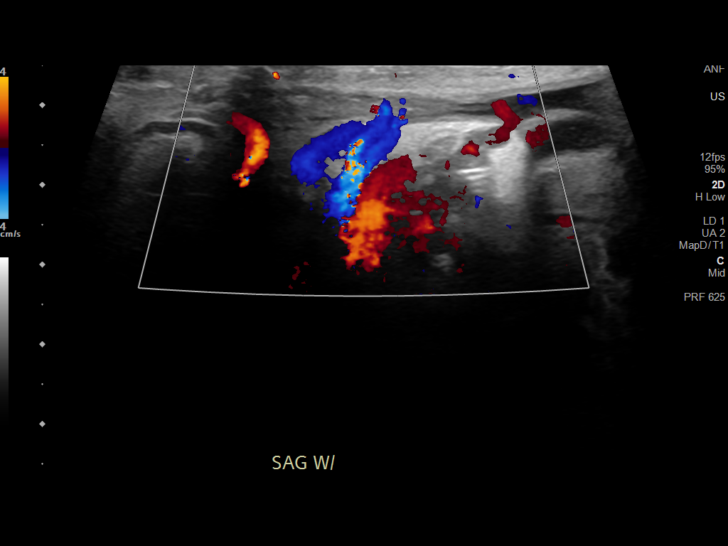

[14 of 25 positions shown; findings below may reference images not displayed]

FINDINGS: Right testicle

Measurements: 3.9 by 2.8 by 2.4 cm. No mass or microlithiasis
visualized. Subtle heteroechogenicity.

Left testicle

Measurements: 4.2 by 2.3 by 2.8 cm. No mass or microlithiasis
visualized. Subtle heteroechogenicity.

Right epididymis:  Normal in size and appearance.

Left epididymis:  Normal in size and appearance.

Hydrocele:  Small bilateral hydroceles.

Varicocele:  None visualized.

Other: Complex region along the right inguinal canal, likely
postoperative.
IMPRESSION: 1. Small bilateral hydroceles with subtle heteroechogenicity in the
testicular parenchyma bilaterally, but no appreciable mass or
asymmetric hypervascularity to further suggest orchitis.
2. Indistinct complex appearance in the right groin, probably
related to recent surgery.
